# Patient Record
Sex: Female | Born: 1940 | Race: White | Hispanic: No | Smoking: Never smoker
Health system: Southern US, Community
[De-identification: ages and names within clinical notes are randomized; demographics above are authoritative.]

## PROBLEM LIST (undated history)

## (undated) DIAGNOSIS — K5792 Diverticulitis of intestine, part unspecified, without perforation or abscess without bleeding: Secondary | ICD-10-CM

## (undated) DIAGNOSIS — J383 Other diseases of vocal cords: Secondary | ICD-10-CM

## (undated) DIAGNOSIS — I5189 Other ill-defined heart diseases: Secondary | ICD-10-CM

## (undated) DIAGNOSIS — R0600 Dyspnea, unspecified: Secondary | ICD-10-CM

## (undated) DIAGNOSIS — I00 Rheumatic fever without heart involvement: Secondary | ICD-10-CM

## (undated) DIAGNOSIS — I10 Essential (primary) hypertension: Secondary | ICD-10-CM

## (undated) DIAGNOSIS — K449 Diaphragmatic hernia without obstruction or gangrene: Secondary | ICD-10-CM

## (undated) DIAGNOSIS — E7800 Pure hypercholesterolemia, unspecified: Secondary | ICD-10-CM

## (undated) DIAGNOSIS — I447 Left bundle-branch block, unspecified: Secondary | ICD-10-CM

## (undated) DIAGNOSIS — E119 Type 2 diabetes mellitus without complications: Secondary | ICD-10-CM

## (undated) DIAGNOSIS — I509 Heart failure, unspecified: Secondary | ICD-10-CM

## (undated) DIAGNOSIS — K922 Gastrointestinal hemorrhage, unspecified: Secondary | ICD-10-CM

## (undated) DIAGNOSIS — Z86010 Personal history of colonic polyps: Secondary | ICD-10-CM

## (undated) DIAGNOSIS — Z8489 Family history of other specified conditions: Secondary | ICD-10-CM

## (undated) DIAGNOSIS — G4730 Sleep apnea, unspecified: Secondary | ICD-10-CM

## (undated) DIAGNOSIS — N809 Endometriosis, unspecified: Secondary | ICD-10-CM

## (undated) DIAGNOSIS — N183 Chronic kidney disease, stage 3 unspecified: Secondary | ICD-10-CM

## (undated) DIAGNOSIS — I480 Paroxysmal atrial fibrillation: Secondary | ICD-10-CM

## (undated) DIAGNOSIS — D649 Anemia, unspecified: Secondary | ICD-10-CM

## (undated) DIAGNOSIS — R32 Unspecified urinary incontinence: Secondary | ICD-10-CM

## (undated) DIAGNOSIS — M810 Age-related osteoporosis without current pathological fracture: Secondary | ICD-10-CM

## (undated) DIAGNOSIS — Z9861 Coronary angioplasty status: Secondary | ICD-10-CM

## (undated) DIAGNOSIS — G43909 Migraine, unspecified, not intractable, without status migrainosus: Secondary | ICD-10-CM

## (undated) DIAGNOSIS — K219 Gastro-esophageal reflux disease without esophagitis: Secondary | ICD-10-CM

## (undated) DIAGNOSIS — I2510 Atherosclerotic heart disease of native coronary artery without angina pectoris: Secondary | ICD-10-CM

## (undated) DIAGNOSIS — I2119 ST elevation (STEMI) myocardial infarction involving other coronary artery of inferior wall: Secondary | ICD-10-CM

## (undated) HISTORY — PX: BREAST EXCISIONAL BIOPSY: SUR124

## (undated) HISTORY — DX: Paroxysmal atrial fibrillation: I48.0

## (undated) HISTORY — DX: Chronic kidney disease, stage 3 unspecified: N18.30

## (undated) HISTORY — DX: Gastrointestinal hemorrhage, unspecified: K92.2

## (undated) HISTORY — DX: Rheumatic fever without heart involvement: I00

## (undated) HISTORY — DX: Migraine, unspecified, not intractable, without status migrainosus: G43.909

## (undated) HISTORY — DX: Personal history of colonic polyps: Z86.010

## (undated) HISTORY — DX: Sleep apnea, unspecified: G47.30

## (undated) HISTORY — DX: Endometriosis, unspecified: N80.9

## (undated) HISTORY — PX: TONSILLECTOMY: SUR1361

## (undated) HISTORY — PX: APPENDECTOMY: SHX54

## (undated) HISTORY — DX: Chronic kidney disease, stage 3 (moderate): N18.3

## (undated) HISTORY — DX: Unspecified urinary incontinence: R32

## (undated) HISTORY — PX: OTHER SURGICAL HISTORY: SHX169

## (undated) HISTORY — DX: Age-related osteoporosis without current pathological fracture: M81.0

## (undated) HISTORY — PX: BREAST BIOPSY: SHX20

## (undated) HISTORY — DX: Heart failure, unspecified: I50.9

## (undated) HISTORY — PX: BREAST SURGERY: SHX581

## (undated) HISTORY — DX: Anemia, unspecified: D64.9

## (undated) HISTORY — DX: Pure hypercholesterolemia, unspecified: E78.00

## (undated) HISTORY — DX: Essential (primary) hypertension: I10

## (undated) HISTORY — DX: Left bundle-branch block, unspecified: I44.7

## (undated) HISTORY — PX: ABDOMINAL HYSTERECTOMY: SHX81

## (undated) HISTORY — DX: Diaphragmatic hernia without obstruction or gangrene: K44.9

## (undated) HISTORY — DX: Other ill-defined heart diseases: I51.89

## (undated) HISTORY — DX: Diverticulitis of intestine, part unspecified, without perforation or abscess without bleeding: K57.92

## (undated) HISTORY — DX: Type 2 diabetes mellitus without complications: E11.9

---

## 1994-08-30 DIAGNOSIS — Z860100 Personal history of colon polyps, unspecified: Secondary | ICD-10-CM

## 1994-08-30 HISTORY — DX: Personal history of colon polyps, unspecified: Z86.0100

## 1994-08-30 HISTORY — DX: Personal history of colonic polyps: Z86.010

## 2004-03-10 ENCOUNTER — Ambulatory Visit: Payer: Self-pay | Admitting: General Surgery

## 2005-03-16 ENCOUNTER — Ambulatory Visit: Payer: Self-pay | Admitting: General Surgery

## 2005-06-15 ENCOUNTER — Ambulatory Visit: Payer: Self-pay | Admitting: Internal Medicine

## 2006-02-01 ENCOUNTER — Ambulatory Visit: Payer: Self-pay | Admitting: Internal Medicine

## 2006-05-03 ENCOUNTER — Ambulatory Visit: Payer: Self-pay | Admitting: General Surgery

## 2006-05-24 ENCOUNTER — Ambulatory Visit: Payer: Self-pay | Admitting: General Surgery

## 2006-06-08 ENCOUNTER — Ambulatory Visit: Payer: Self-pay | Admitting: General Surgery

## 2006-06-28 ENCOUNTER — Ambulatory Visit: Payer: Self-pay | Admitting: Internal Medicine

## 2006-08-25 ENCOUNTER — Ambulatory Visit: Payer: Self-pay | Admitting: Specialist

## 2006-12-10 ENCOUNTER — Ambulatory Visit: Payer: Self-pay | Admitting: Pain Medicine

## 2006-12-25 ENCOUNTER — Ambulatory Visit: Payer: Self-pay | Admitting: Pain Medicine

## 2007-01-10 ENCOUNTER — Ambulatory Visit: Payer: Self-pay | Admitting: Physician Assistant

## 2007-05-14 ENCOUNTER — Ambulatory Visit: Payer: Self-pay | Admitting: General Surgery

## 2008-02-12 ENCOUNTER — Ambulatory Visit: Payer: Self-pay | Admitting: Internal Medicine

## 2008-04-15 ENCOUNTER — Ambulatory Visit: Payer: Self-pay | Admitting: Unknown Physician Specialty

## 2008-06-02 ENCOUNTER — Ambulatory Visit: Payer: Self-pay | Admitting: General Surgery

## 2009-05-01 HISTORY — PX: CARDIAC CATHETERIZATION: SHX172

## 2009-06-04 ENCOUNTER — Ambulatory Visit: Payer: Self-pay | Admitting: General Surgery

## 2009-06-11 ENCOUNTER — Ambulatory Visit: Payer: Self-pay | Admitting: General Surgery

## 2009-06-14 ENCOUNTER — Ambulatory Visit: Payer: Self-pay | Admitting: Specialist

## 2009-12-09 ENCOUNTER — Ambulatory Visit: Payer: Self-pay | Admitting: General Surgery

## 2010-01-13 ENCOUNTER — Ambulatory Visit: Payer: Self-pay | Admitting: Internal Medicine

## 2010-01-29 HISTORY — PX: CARDIAC CATHETERIZATION: SHX172

## 2010-02-09 ENCOUNTER — Ambulatory Visit: Payer: Self-pay | Admitting: Cardiology

## 2010-06-06 ENCOUNTER — Ambulatory Visit: Payer: Medicare Other | Admitting: General Surgery

## 2010-06-08 ENCOUNTER — Ambulatory Visit: Payer: Medicare Other | Admitting: General Surgery

## 2010-12-15 ENCOUNTER — Ambulatory Visit: Payer: Medicare Other | Admitting: General Surgery

## 2011-05-02 HISTORY — PX: COLONOSCOPY: SHX174

## 2011-06-08 ENCOUNTER — Ambulatory Visit: Payer: Self-pay | Admitting: General Surgery

## 2011-06-22 ENCOUNTER — Ambulatory Visit: Payer: Self-pay | Admitting: General Surgery

## 2011-06-23 ENCOUNTER — Ambulatory Visit: Payer: Self-pay | Admitting: General Surgery

## 2012-02-08 LAB — HEPATIC FUNCTION PANEL
ALT: 16 U/L (ref 7–35)
AST: 24 U/L (ref 13–35)
Alkaline Phosphatase: 58 U/L (ref 25–125)
Bilirubin, Total: 0.7 mg/dL

## 2012-02-08 LAB — LIPID PANEL
Cholesterol: 178 mg/dL (ref 0–200)
HDL: 45 mg/dL (ref 35–70)

## 2012-02-08 LAB — BASIC METABOLIC PANEL
Creatinine: 0.8 mg/dL (ref <=1.1)
Glucose: 96 mg/dL
Potassium: 4.5 mmol/L (ref 3.4–5.3)

## 2012-06-10 ENCOUNTER — Ambulatory Visit: Payer: Self-pay | Admitting: General Surgery

## 2012-06-12 LAB — HM MAMMOGRAPHY

## 2012-07-08 ENCOUNTER — Encounter: Payer: Self-pay | Admitting: *Deleted

## 2012-07-08 ENCOUNTER — Other Ambulatory Visit: Payer: Self-pay | Admitting: *Deleted

## 2012-07-09 ENCOUNTER — Ambulatory Visit (INDEPENDENT_AMBULATORY_CARE_PROVIDER_SITE_OTHER): Payer: BC Managed Care – PPO | Admitting: Internal Medicine

## 2012-07-09 ENCOUNTER — Encounter: Payer: Self-pay | Admitting: Internal Medicine

## 2012-07-09 VITALS — BP 120/60 | HR 73 | Temp 98.3°F | Ht 59.15 in | Wt 173.0 lb

## 2012-07-09 DIAGNOSIS — Z860100 Personal history of colon polyps, unspecified: Secondary | ICD-10-CM

## 2012-07-09 DIAGNOSIS — I10 Essential (primary) hypertension: Secondary | ICD-10-CM

## 2012-07-09 DIAGNOSIS — M810 Age-related osteoporosis without current pathological fracture: Secondary | ICD-10-CM

## 2012-07-09 DIAGNOSIS — E7800 Pure hypercholesterolemia, unspecified: Secondary | ICD-10-CM

## 2012-07-09 DIAGNOSIS — G4733 Obstructive sleep apnea (adult) (pediatric): Secondary | ICD-10-CM

## 2012-08-18 ENCOUNTER — Encounter: Payer: Self-pay | Admitting: Internal Medicine

## 2012-08-18 DIAGNOSIS — Z86010 Personal history of colonic polyps: Secondary | ICD-10-CM | POA: Insufficient documentation

## 2012-08-18 DIAGNOSIS — E785 Hyperlipidemia, unspecified: Secondary | ICD-10-CM | POA: Insufficient documentation

## 2012-08-18 DIAGNOSIS — E7800 Pure hypercholesterolemia, unspecified: Secondary | ICD-10-CM | POA: Insufficient documentation

## 2012-08-18 DIAGNOSIS — M810 Age-related osteoporosis without current pathological fracture: Secondary | ICD-10-CM | POA: Insufficient documentation

## 2012-08-18 DIAGNOSIS — G4733 Obstructive sleep apnea (adult) (pediatric): Secondary | ICD-10-CM | POA: Insufficient documentation

## 2012-08-18 DIAGNOSIS — I10 Essential (primary) hypertension: Secondary | ICD-10-CM | POA: Insufficient documentation

## 2012-08-18 NOTE — Assessment & Plan Note (Signed)
Continue with CPAP

## 2012-08-18 NOTE — Assessment & Plan Note (Signed)
On pravastatin.  Low cholesterol diet and exercise.  Check lipid panel and liver function.

## 2012-08-18 NOTE — Assessment & Plan Note (Signed)
Obtain records of last bone density.

## 2012-08-18 NOTE — Assessment & Plan Note (Signed)
Blood pressure as outlined.  Continue same medication regimen.  Check metabolic panel.

## 2012-08-18 NOTE — Assessment & Plan Note (Signed)
2008 colonoscopy as outlined.  Obtain 2013 results.

## 2012-08-18 NOTE — Progress Notes (Signed)
  Subjective:    Patient ID: April [name], female    DOB: Nov 19, 1940, 72 y.o.   MRN: [id number]  HPI 72 year old female with past history of hypertension, hypercholesterolemia, sleep apnea and osteoporosis who comes in today to follow up on these issues as well as to establish care.  She states she is doing relatively well.  Previously was seeing Dr Ouida Sills.  She sees Dr Jamal Collin for her breast.  Had follow up with him 2/14.  Sees him once a year.  Had a colonoscopy 06/23/11.  Diverticulosis.  Had a heart cath in 2013 - Dr Saralyn Pilar.  Need results.  This was done secondary to problems she was having with sweating - head to toe.  Would feel light headed.  Had a questionable stress test.  States cath ok.  Breathing is stable.  Gets sob with exertion, but reports this is no change.  Has had issues with bronchitis.  Saw Dr Pryor Ochoa.  Has never smoked.  Was exposed to second hand smoke.  Has a history of an irregular heart beat.  May notice an occasional fluttering.  No acid reflux reported.  Eating and drinking well.  Bowels stable.    Past Medical History  Diagnosis Date  . Hypertension   . Hypercholesterolemia   . History of colon polyps 08/1994  . Osteoporosis   . Sleep apnea     CPAP  . Arrhythmia     history of an irregular heart beat  . Diverticulitis   . Urine incontinence   . Rheumatic fever   . Migraines     history of migraines  . Endometriosis     Review of Systems Patient denies any headache, lightheadedness or dizziness.  No sinus or allergy symptoms currently. No chest pain, tightness or significant palpitations.  May notice an occasional fluttering.  No increased shortness of breath, cough or congestion.  No acid reflux reported.  No nausea or vomiting.  No abdominal pain or cramping.  No bowel change, such as diarrhea, constipation, BRBPR or melana.  No urine change.        Objective:   Physical Exam Filed Vitals:   07/09/12 1137  BP: 120/60  Pulse: 73  Temp: 98.3 F (56.60  C)   72 year old female in no acute distress.   HEENT:  Nares- clear.  Oropharynx - without lesions. NECK:  Supple.  Nontender.  No audible bruit.  HEART:  Appears to be regular. LUNGS:  No crackles or wheezing audible.  Respirations even and unlabored.  RADIAL PULSE:  Equal bilaterally.    ABDOMEN:  Soft, nontender.  Bowel sounds present and normal.  No audible abdominal bruit.   EXTREMITIES:  No increased edema present.  DP pulses palpable and equal bilaterally.           Assessment & Plan:  HEALTH MAINTENANCE.  Schedule her for a physical next visit.  Obtain records from Dr Jamal Collin - mammograms and breast exams.  States she is up to date.  Colonoscopy 2/08 normal.  Was due 06/2011.  States had 06/23/11.  Obtain records if had 2013 colon.  Pneumovax 06/12/05, Tdap 3/12 and Zostavax 2008.

## 2012-08-29 ENCOUNTER — Telehealth: Payer: Self-pay

## 2012-08-29 NOTE — Telephone Encounter (Signed)
Patient left message on voicemail about some prescriptions for supplies for her CPAP. Left message for patient to return call to office to clarify exactly what she needs.

## 2012-08-29 NOTE — Telephone Encounter (Signed)
Just let me know what I need to do.

## 2012-08-30 NOTE — Telephone Encounter (Signed)
Left message for patient to return call office about CPAP supplies.

## 2012-09-04 ENCOUNTER — Telehealth: Payer: Self-pay | Admitting: Internal Medicine

## 2012-09-04 NOTE — Telephone Encounter (Signed)
montrice had a message on this patient regarding her CPAP supplies.  Pt's mask is leaking and she needs [facility] to evaluate for a new mask and tubing.  Can use more filters also.  Pt spoke to [place] informed her needed a rx from [place].  I just wrote a generic rx for evaluation.  Let me know if I need to do something more.  Thanks.

## 2012-10-11 ENCOUNTER — Ambulatory Visit (INDEPENDENT_AMBULATORY_CARE_PROVIDER_SITE_OTHER): Payer: BC Managed Care – PPO | Admitting: Internal Medicine

## 2012-10-11 ENCOUNTER — Other Ambulatory Visit (HOSPITAL_COMMUNITY)
Admission: RE | Admit: 2012-10-11 | Discharge: 2012-10-11 | Disposition: A | Discharge status: Home / Self Care | Payer: BC Managed Care – PPO | Source: Ambulatory Visit | Attending: Internal Medicine | Admitting: Internal Medicine

## 2012-10-11 ENCOUNTER — Encounter: Payer: Self-pay | Admitting: Internal Medicine

## 2012-10-11 ENCOUNTER — Ambulatory Visit (INDEPENDENT_AMBULATORY_CARE_PROVIDER_SITE_OTHER)
Admission: RE | Admit: 2012-10-11 | Discharge: 2012-10-11 | Disposition: A | Discharge status: Home / Self Care | Payer: BC Managed Care – PPO | Source: Ambulatory Visit | Attending: Internal Medicine | Admitting: Internal Medicine

## 2012-10-11 VITALS — BP 138/70 | HR 66 | Temp 98.4°F | Ht 60.0 in | Wt 174.5 lb

## 2012-10-11 DIAGNOSIS — K219 Gastro-esophageal reflux disease without esophagitis: Secondary | ICD-10-CM

## 2012-10-11 DIAGNOSIS — Z124 Encounter for screening for malignant neoplasm of cervix: Secondary | ICD-10-CM

## 2012-10-11 DIAGNOSIS — R0602 Shortness of breath: Secondary | ICD-10-CM

## 2012-10-11 DIAGNOSIS — Z86010 Personal history of colonic polyps: Secondary | ICD-10-CM

## 2012-10-11 DIAGNOSIS — Z01419 Encounter for gynecological examination (general) (routine) without abnormal findings: Secondary | ICD-10-CM | POA: Insufficient documentation

## 2012-10-11 DIAGNOSIS — E7800 Pure hypercholesterolemia, unspecified: Secondary | ICD-10-CM

## 2012-10-11 DIAGNOSIS — I10 Essential (primary) hypertension: Secondary | ICD-10-CM

## 2012-10-11 DIAGNOSIS — G4733 Obstructive sleep apnea (adult) (pediatric): Secondary | ICD-10-CM

## 2012-10-11 DIAGNOSIS — Z1151 Encounter for screening for human papillomavirus (HPV): Secondary | ICD-10-CM | POA: Insufficient documentation

## 2012-10-11 DIAGNOSIS — M810 Age-related osteoporosis without current pathological fracture: Secondary | ICD-10-CM

## 2012-10-13 ENCOUNTER — Encounter: Payer: Self-pay | Admitting: Internal Medicine

## 2012-10-13 DIAGNOSIS — K219 Gastro-esophageal reflux disease without esophagitis: Secondary | ICD-10-CM | POA: Insufficient documentation

## 2012-10-13 DIAGNOSIS — R0602 Shortness of breath: Secondary | ICD-10-CM | POA: Insufficient documentation

## 2012-10-13 DIAGNOSIS — R0609 Other forms of dyspnea: Secondary | ICD-10-CM | POA: Insufficient documentation

## 2012-10-13 NOTE — Progress Notes (Signed)
Subjective:    Patient ID: April [name], female    DOB: 1941-01-03, 72 y.o.   MRN: [id number]  HPI 72 year old female with past history of hypertension, hypercholesterolemia, sleep apnea and osteoporosis who comes in today to follow up on these issues as well as for a complete physical exam.  She states she is doing relatively well.  She sees Dr Jamal Collin for her breast.  Had follow up with him 2/14.  Sees him once a year.  Had a colonoscopy 06/23/11.  Diverticulosis.  Had a heart cath in 2013 - Dr Saralyn Pilar.  This was done secondary to problems she was having with sweating - head to toe.  Would feel light headed.  Had a questionable stress test.  States cath ok.  She does report getting sob with exertion.  No significant difference as it was when she had her cath.  Is persistent.  Has had issues with bronchitis.  Saw Dr Pryor Ochoa.  Has never smoked.  Was exposed to second hand smoke.  Has a history of an irregular heart beat.  Per pt, her heart appears to be stable.  Eating and drinking well.  Bowels stable.  Does report some acid reflux.  Takes TUMS prn.  Has previously been on prilosec and prevacid.  Seeing Dr Tobe Sos.  Treating a current eye infection.     Past Medical History  Diagnosis Date  . Hypertension   . Hypercholesterolemia   . History of colon polyps 08/1994  . Osteoporosis   . Sleep apnea     CPAP  . Arrhythmia     history of an irregular heart beat  . Diverticulitis   . Urine incontinence   . Rheumatic fever   . Migraines     history of migraines  . Endometriosis     Current Outpatient Prescriptions on File Prior to Visit  Medication Sig Dispense Refill  . aspirin 81 MG tablet Take 81 mg by mouth daily.      . carvedilol (COREG) 25 MG tablet Take 25 mg by mouth 2 (two) times daily with a meal.      . fish oil-omega-3 fatty acids 1000 MG capsule Take 1 g by mouth daily.      Marland Kitchen losartan-hydrochlorothiazide (HYZAAR) 100-25 MG per tablet Take 1 tablet by mouth daily.      . Misc  Natural Products (OSTEO BI-FLEX JOINT SHIELD PO) Take by mouth 2 (two) times daily.      . pravastatin (PRAVACHOL) 20 MG tablet Take 20 mg by mouth daily.      . traMADol (ULTRAM) 50 MG tablet Take 50 mg by mouth 2 (two) times daily as needed for pain.      . calcium carbonate (TUMS - DOSED IN MG ELEMENTAL CALCIUM) 500 MG chewable tablet Chew 1 tablet by mouth daily.      Marland Kitchen triamcinolone (NASACORT) 55 MCG/ACT nasal inhaler Place 2 sprays into the nose daily.       No current facility-administered medications on file prior to visit.    Review of Systems Patient denies any headache, lightheadedness or dizziness.  No sinus or allergy symptoms currently. No chest pain, tightness or significant palpitations.  No increased cough or congestion.  Does report some sob with exertion.  Fatigue with exertion.  Acid reflux as outlined.  No nausea or vomiting.  No abdominal pain or cramping.  No bowel change, such as diarrhea, constipation, BRBPR or melana.  No urine change.  Objective:   Physical Exam  Filed Vitals:   10/11/12 1033  BP: 138/70  Pulse: 66  Temp: 98.4 F (28.93 C)   72 year old female in no acute distress.   HEENT:  Nares- clear.  Oropharynx - without lesions. NECK:  Supple.  Nontender.  No audible bruit.  HEART:  Appears to be regular. LUNGS:  No crackles or wheezing audible.  Respirations even and unlabored.  RADIAL PULSE:  Equal bilaterally.    BREASTS:  No nipple discharge or nipple retraction present.  Could not appreciate any distinct nodules or axillary adenopathy.  ABDOMEN:  Soft, nontender.  Bowel sounds present and normal.  No audible abdominal bruit.  GU:  Normal external genitalia.  Vaginal vault without lesions.  S/p hysterectomy.   Pap performed. Could not appreciate any adnexal masses or tenderness.   RECTAL:  Heme negative.   EXTREMITIES:  No increased edema present.  DP pulses palpable and equal bilaterally.          Assessment & Plan:  HEALTH MAINTENANCE.   Physical today.  Colonoscopy 06/23/11.  Pneumovax 06/12/05, Tdap 3/12 and Zostavax 2008.  Mammogram 06/10/12.  Followed by Dr Jamal Collin.

## 2012-10-13 NOTE — Assessment & Plan Note (Signed)
On pravastatin.  Low cholesterol diet and exercise.  Check lipid panel and liver function.

## 2012-10-13 NOTE — Assessment & Plan Note (Signed)
Blood pressure as outlined.  Continue same medication regimen.  Check metabolic panel.

## 2012-10-13 NOTE — Assessment & Plan Note (Signed)
Continue with CPAP

## 2012-10-13 NOTE — Assessment & Plan Note (Signed)
Avoid eating late and avoid foods that aggravate.  May need to consider protonix 40mg  q day.  Follow.

## 2012-10-13 NOTE — Assessment & Plan Note (Signed)
Last colonoscopy 06/23/11 revealed diverticulosis otherwise normal.  Recommended f/u colonoscopy in five years.

## 2012-10-13 NOTE — Assessment & Plan Note (Addendum)
Continue calcium and vitamin D 

## 2012-10-13 NOTE — Assessment & Plan Note (Signed)
Has had extensive cardiac w/up as outlined.  Sob with exertion.  Will hold on repeat cardiac w/up.  She feels from a heart standpoint - things are stable.  Check cxr.  Further w/up pending.

## 2012-10-14 ENCOUNTER — Telehealth: Payer: Self-pay | Admitting: Internal Medicine

## 2012-10-14 ENCOUNTER — Other Ambulatory Visit: Payer: Self-pay | Admitting: Internal Medicine

## 2012-10-14 DIAGNOSIS — R9389 Abnormal findings on diagnostic imaging of other specified body structures: Secondary | ICD-10-CM

## 2012-10-14 DIAGNOSIS — R0602 Shortness of breath: Secondary | ICD-10-CM

## 2012-10-14 DIAGNOSIS — I10 Essential (primary) hypertension: Secondary | ICD-10-CM

## 2012-10-14 NOTE — Telephone Encounter (Signed)
Pt notified of cxr results.  Agreeable to CT chest.  Order placed.  She is getting her labs Thursday and I want her CT to be done after we have her lab results.  Ok to schedule for next week.  Thanks.

## 2012-10-14 NOTE — Progress Notes (Signed)
Order placed for urinalysis 

## 2012-10-16 ENCOUNTER — Encounter: Payer: Self-pay | Admitting: *Deleted

## 2012-10-17 ENCOUNTER — Other Ambulatory Visit (INDEPENDENT_AMBULATORY_CARE_PROVIDER_SITE_OTHER): Payer: BC Managed Care – PPO

## 2012-10-17 DIAGNOSIS — G4733 Obstructive sleep apnea (adult) (pediatric): Secondary | ICD-10-CM

## 2012-10-17 DIAGNOSIS — M810 Age-related osteoporosis without current pathological fracture: Secondary | ICD-10-CM

## 2012-10-17 DIAGNOSIS — I10 Essential (primary) hypertension: Secondary | ICD-10-CM

## 2012-10-17 DIAGNOSIS — E7800 Pure hypercholesterolemia, unspecified: Secondary | ICD-10-CM

## 2012-10-17 LAB — HEPATIC FUNCTION PANEL
AST: 33 U/L (ref 0–37)
Albumin: 3.7 g/dL (ref 3.5–5.2)
Alkaline Phosphatase: 60 U/L (ref 39–117)
Total Protein: 6.8 g/dL (ref 6.0–8.3)

## 2012-10-17 LAB — LIPID PANEL
Cholesterol: 187 mg/dL (ref 0–200)
LDL Cholesterol: 116 mg/dL — ABNORMAL HIGH (ref 0–99)
Triglycerides: 112 mg/dL (ref 0.0–149.0)

## 2012-10-17 LAB — BASIC METABOLIC PANEL
Chloride: 105 mEq/L (ref 96–112)
Creatinine, Ser: 0.7 mg/dL (ref 0.4–1.2)

## 2012-10-17 LAB — URINALYSIS, ROUTINE W REFLEX MICROSCOPIC
Bilirubin Urine: NEGATIVE
Hgb urine dipstick: NEGATIVE
Ketones, ur: NEGATIVE
Total Protein, Urine: NEGATIVE
Urine Glucose: NEGATIVE

## 2012-10-18 ENCOUNTER — Telehealth: Payer: Self-pay

## 2012-10-18 LAB — CBC WITH DIFFERENTIAL/PLATELET
Basophils Absolute: 0 10*3/uL (ref 0.0–0.1)
Eosinophils Absolute: 0.2 10*3/uL (ref 0.0–0.7)
Lymphocytes Relative: 31.8 % (ref 12.0–46.0)
MCHC: 32.7 g/dL (ref 30.0–36.0)
Neutro Abs: 3.7 10*3/uL (ref 1.4–7.7)
Neutrophils Relative %: 56.7 % (ref 43.0–77.0)
RDW: 13.3 % (ref 11.5–14.6)

## 2012-10-18 NOTE — Telephone Encounter (Signed)
Fax patient recent labs to the [facility] for patient please to this number. [phone] Attn: Colletta [place]

## 2012-10-21 ENCOUNTER — Ambulatory Visit: Payer: Self-pay | Admitting: Internal Medicine

## 2012-10-21 NOTE — Telephone Encounter (Signed)
Labs faxed

## 2012-10-22 ENCOUNTER — Telehealth: Payer: Self-pay | Admitting: Internal Medicine

## 2012-10-22 NOTE — Telephone Encounter (Signed)
This is the patient you talked to [place] about and she had stated she may need a new sleep study.  I explained this to the pt and the pt informed me that April [name] told her she just needed a copy of her sleep study.  She was going to call April [name] and find out what was needed.  I don't think from your previous conversation, that I can just send a rx over.  (maybe I misunderstood).  Please let pt know what she needs to do.  Thanks.

## 2012-10-22 NOTE — Telephone Encounter (Signed)
rx written and in your box.  Thanks.

## 2012-10-22 NOTE — Telephone Encounter (Signed)
Pt stated that she has an appt with [name] at [facility] tomorrow. [name] told her that all she needs is a Careers information officer for a new Mask & hose/tubing.

## 2012-10-22 NOTE — Telephone Encounter (Signed)
Patient left me a voicemail stating we were supposed to fax an order to [facility] for the patient to get supplies. Please advise

## 2012-10-24 NOTE — Telephone Encounter (Signed)
Spoke with [name] at [facility]- she stated that the April [name] was going to bring in a copy of her sleep study today at her appt. She told me to hold the RX until she send me an order. However, if her sleep study is out dated, she will need a new study. Pt also informed

## 2012-10-28 ENCOUNTER — Telehealth: Payer: Self-pay | Admitting: *Deleted

## 2012-10-28 DIAGNOSIS — G4733 Obstructive sleep apnea (adult) (pediatric): Secondary | ICD-10-CM

## 2012-10-28 NOTE — Telephone Encounter (Signed)
I spoke with April [name] this morning & notified her that we are unable to determine the CPAP settings that she should be on based on her 2002 sleep study. Pt informed that a new sleep study should be ordered. Pt is agreeable.

## 2012-10-28 NOTE — Telephone Encounter (Signed)
Order placed for f/u sleep study.

## 2012-10-30 ENCOUNTER — Ambulatory Visit: Payer: Self-pay | Admitting: Ophthalmology

## 2012-10-30 DIAGNOSIS — H04209 Unspecified epiphora, unspecified lacrimal gland: Secondary | ICD-10-CM | POA: Diagnosis not present

## 2012-11-08 ENCOUNTER — Encounter: Payer: Self-pay | Admitting: *Deleted

## 2012-11-15 ENCOUNTER — Encounter: Payer: Self-pay | Admitting: Internal Medicine

## 2013-02-11 ENCOUNTER — Ambulatory Visit (INDEPENDENT_AMBULATORY_CARE_PROVIDER_SITE_OTHER): Payer: No Typology Code available for payment source | Admitting: Internal Medicine

## 2013-02-11 ENCOUNTER — Encounter: Payer: Self-pay | Admitting: Internal Medicine

## 2013-02-11 VITALS — BP 118/80 | HR 69 | Temp 98.4°F | Ht 60.0 in | Wt 167.5 lb

## 2013-02-11 DIAGNOSIS — E7800 Pure hypercholesterolemia, unspecified: Secondary | ICD-10-CM

## 2013-02-11 DIAGNOSIS — R0602 Shortness of breath: Secondary | ICD-10-CM

## 2013-02-11 DIAGNOSIS — F439 Reaction to severe stress, unspecified: Secondary | ICD-10-CM

## 2013-02-11 DIAGNOSIS — R079 Chest pain, unspecified: Secondary | ICD-10-CM

## 2013-02-11 DIAGNOSIS — G4733 Obstructive sleep apnea (adult) (pediatric): Secondary | ICD-10-CM

## 2013-02-11 DIAGNOSIS — Z733 Stress, not elsewhere classified: Secondary | ICD-10-CM

## 2013-02-11 DIAGNOSIS — I10 Essential (primary) hypertension: Secondary | ICD-10-CM

## 2013-02-11 DIAGNOSIS — R0781 Pleurodynia: Secondary | ICD-10-CM

## 2013-02-11 DIAGNOSIS — K219 Gastro-esophageal reflux disease without esophagitis: Secondary | ICD-10-CM

## 2013-02-11 DIAGNOSIS — Z86010 Personal history of colonic polyps: Secondary | ICD-10-CM

## 2013-02-11 DIAGNOSIS — M810 Age-related osteoporosis without current pathological fracture: Secondary | ICD-10-CM

## 2013-02-16 ENCOUNTER — Encounter: Payer: Self-pay | Admitting: Internal Medicine

## 2013-02-16 DIAGNOSIS — R0781 Pleurodynia: Secondary | ICD-10-CM | POA: Insufficient documentation

## 2013-02-16 DIAGNOSIS — F439 Reaction to severe stress, unspecified: Secondary | ICD-10-CM | POA: Insufficient documentation

## 2013-02-16 NOTE — Assessment & Plan Note (Signed)
Avoid eating late and avoid foods that aggravate.  On nexium now.  Doing better.  Follow.

## 2013-02-16 NOTE — Assessment & Plan Note (Signed)
Better.  Xray negative.  Follow.  Good breath sounds.

## 2013-02-16 NOTE — Assessment & Plan Note (Signed)
Continue with CPAP

## 2013-02-16 NOTE — Assessment & Plan Note (Signed)
Feels she is handling things relatively well.  Follow.  Desires no further intervention.

## 2013-02-16 NOTE — Progress Notes (Signed)
Subjective:    Patient ID: April [name], female    DOB: 18-Dec-1940, 72 y.o.   MRN: [id number]  HPI 72 year old female with past history of hypertension, hypercholesterolemia, sleep apnea and osteoporosis who comes in today for a scheduled follow up.  She states she is doing relatively well.  Increased stress dealing with her husbands and her daughter's medical issues.  Does not feel she needs any further intervention at this time.  She sees Dr Jamal Collin for her breast.  Had follow up with him 2/14.  Sees him once a year.  Had a colonoscopy 06/23/11.  Diverticulosis.  Had a heart cath in 2013 - Dr Saralyn Pilar.  This was done secondary to problems she was having with sweating - head to toe.  Would feel light headed.  Had a questionable stress test.  States cath ok.  She does report getting sob with exertion.  No significant difference as it was when she had her cath.  Is persistent.  Has had issues with bronchitis.  Saw Dr Pryor Ochoa.  Has never smoked.  Was exposed to second hand smoke.  Has a history of an irregular heart beat.  Per pt, her heart appears to be stable.  Eating and drinking well.  Bowels stable.  On nexium now and feels this is helping.  She had an abnormal cxr and subsequently had a CT chest.  Has a large hiatal hernia.  Feels the acid reflux is stable/controlled.  Swallowing ok.  She is having some right rib pain.  [facility].  Xray negative.  Better.  No significant pain with deep breathing.       Past Medical History  Diagnosis Date  . Hypertension   . Hypercholesterolemia   . History of colon polyps 08/1994  . Osteoporosis   . Sleep apnea     CPAP  . Arrhythmia     history of an irregular heart beat  . Diverticulitis   . Urine incontinence   . Rheumatic fever   . Migraines     history of migraines  . Endometriosis     Current Outpatient Prescriptions on File Prior to Visit  Medication Sig Dispense Refill  . aspirin 81 MG tablet Take 81 mg by mouth daily.       . calcium carbonate (TUMS - DOSED IN MG ELEMENTAL CALCIUM) 500 MG chewable tablet Chew 1 tablet by mouth daily.      . carvedilol (COREG) 25 MG tablet Take 25 mg by mouth 2 (two) times daily with a meal.      . fish oil-omega-3 fatty acids 1000 MG capsule Take 1 g by mouth daily.      Marland Kitchen losartan-hydrochlorothiazide (HYZAAR) 100-25 MG per tablet Take 1 tablet by mouth daily.      . Misc Natural Products (OSTEO BI-FLEX JOINT SHIELD PO) Take by mouth 2 (two) times daily.      . pravastatin (PRAVACHOL) 20 MG tablet Take 20 mg by mouth daily.      . traMADol (ULTRAM) 50 MG tablet Take 50 mg by mouth 2 (two) times daily as needed for pain.      Marland Kitchen triamcinolone (NASACORT) 55 MCG/ACT nasal inhaler Place 2 sprays into the nose daily as needed.        No current facility-administered medications on file prior to visit.    Review of Systems Patient denies any headache, lightheadedness or dizziness.  No sinus or allergy symptoms currently. No chest pain, tightness or  significant palpitations.  No increased cough or congestion.  Does report some sob with exertion.  Stable.  W/up as outlined.  Rib pain as outlined.  Improved.   Acid reflux as outlined.  Better.  No nausea or vomiting.  No abdominal pain or cramping.  No bowel change, such as diarrhea, constipation, BRBPR or melana.  No urine change.        Objective:   Physical Exam  Filed Vitals:   02/11/13 1104  BP: 118/80  Pulse: 69  Temp: 98.4 F (36.9 C)   Blood pressure recheck:  128/70, pulse 87  72 year old female in no acute distress.   HEENT:  Nares- clear.  Oropharynx - without lesions. NECK:  Supple.  Nontender.  No audible bruit.  HEART:  Appears to be regular. LUNGS:  No crackles or wheezing audible.  Respirations even and unlabored.  RADIAL PULSE:  Equal bilaterally.    RIBS:  Minimal pain right lateral rib.  Good breath sounds bilaterally.   ABDOMEN:  Soft, nontender.  Bowel sounds present and normal.  No audible abdominal  bruit.    EXTREMITIES:  No increased edema present.  DP pulses palpable and equal bilaterally.          Assessment & Plan:  HEALTH MAINTENANCE.  Physical 10/11/12.   Colonoscopy 06/23/11.  Pneumovax 06/12/05, Tdap 3/12 and Zostavax 2008.  Mammogram 06/10/12.  Followed by Dr Jamal Collin.

## 2013-02-16 NOTE — Assessment & Plan Note (Signed)
Has had extensive cardiac w/up as outlined.  Sob with exertion.  Will hold on repeat cardiac w/up.  She feels from a heart standpoint - things are stable.  CXR and CT chest as outlined.  Discussed further w/up including pulmonary function testing ,etc.  She wants to monitor for now.  Will notify me if symptoms change or if she changes her mind.

## 2013-02-16 NOTE — Assessment & Plan Note (Signed)
Blood pressure as outlined.  Continue same medication regimen.  Follow metabolic panel.

## 2013-02-16 NOTE — Assessment & Plan Note (Signed)
Last colonoscopy 06/23/11 revealed diverticulosis otherwise normal.  Recommended f/u colonoscopy in five years.

## 2013-02-16 NOTE — Assessment & Plan Note (Signed)
On pravastatin.  Low cholesterol diet and exercise.  Follow lipid panel and liver function.    

## 2013-02-16 NOTE — Assessment & Plan Note (Signed)
Continue vitamin D.  

## 2013-02-24 ENCOUNTER — Other Ambulatory Visit: Payer: Self-pay | Admitting: Internal Medicine

## 2013-02-24 ENCOUNTER — Other Ambulatory Visit (INDEPENDENT_AMBULATORY_CARE_PROVIDER_SITE_OTHER): Payer: No Typology Code available for payment source

## 2013-02-24 DIAGNOSIS — R739 Hyperglycemia, unspecified: Secondary | ICD-10-CM

## 2013-02-24 DIAGNOSIS — I10 Essential (primary) hypertension: Secondary | ICD-10-CM

## 2013-02-24 DIAGNOSIS — E7800 Pure hypercholesterolemia, unspecified: Secondary | ICD-10-CM

## 2013-02-24 LAB — LIPID PANEL
Cholesterol: 178 mg/dL (ref 0–200)
HDL: 56.8 mg/dL (ref >39.00)
LDL Cholesterol: 104 mg/dL — ABNORMAL HIGH (ref 0–99)
Total CHOL/HDL Ratio: 3
Triglycerides: 86 mg/dL (ref 0.0–149.0)

## 2013-02-24 LAB — HEPATIC FUNCTION PANEL
AST: 28 U/L (ref 0–37)
Albumin: 3.5 g/dL (ref 3.5–5.2)
Alkaline Phosphatase: 54 U/L (ref 39–117)
Total Bilirubin: 0.5 mg/dL (ref 0.3–1.2)
Total Protein: 6.5 g/dL (ref 6.0–8.3)

## 2013-02-24 LAB — BASIC METABOLIC PANEL
CO2: 31 mEq/L (ref 19–32)
Chloride: 105 mEq/L (ref 96–112)
Creatinine, Ser: 0.8 mg/dL (ref 0.4–1.2)
Sodium: 146 mEq/L — ABNORMAL HIGH (ref 135–145)

## 2013-02-24 NOTE — Progress Notes (Signed)
Order placed for f/u labs.  

## 2013-03-07 ENCOUNTER — Other Ambulatory Visit: Payer: Self-pay | Admitting: *Deleted

## 2013-03-07 ENCOUNTER — Telehealth: Payer: Self-pay | Admitting: Internal Medicine

## 2013-03-07 MED ORDER — CARVEDILOL 25 MG PO TABS: 25.0000 mg | ORAL_TABLET | Freq: Two times a day (BID) | ORAL | Status: DC

## 2013-03-07 NOTE — Telephone Encounter (Signed)
Pt notified that Rx has been sent in electronically

## 2013-03-07 NOTE — Telephone Encounter (Signed)
The patient only has 1 pill left.  carvedilol (COREG) 25 MG tablet

## 2013-03-24 ENCOUNTER — Other Ambulatory Visit: Payer: No Typology Code available for payment source

## 2013-04-08 ENCOUNTER — Telehealth: Payer: Self-pay | Admitting: Internal Medicine

## 2013-04-08 ENCOUNTER — Other Ambulatory Visit: Payer: No Typology Code available for payment source

## 2013-04-08 ENCOUNTER — Other Ambulatory Visit: Payer: Self-pay | Admitting: *Deleted

## 2013-04-08 MED ORDER — PRAVASTATIN SODIUM 20 MG PO TABS: 20.0000 mg | ORAL_TABLET | Freq: Every day | ORAL | Status: DC

## 2013-04-08 NOTE — Telephone Encounter (Signed)
pravastatin (PRAVACHOL) 20 MG tablet

## 2013-04-08 NOTE — Telephone Encounter (Signed)
Rx sent electronically.  

## 2013-06-12 ENCOUNTER — Ambulatory Visit: Payer: Self-pay | Admitting: General Surgery

## 2013-06-12 DIAGNOSIS — Z1231 Encounter for screening mammogram for malignant neoplasm of breast: Secondary | ICD-10-CM | POA: Diagnosis not present

## 2013-06-13 ENCOUNTER — Encounter: Payer: Self-pay | Admitting: General Surgery

## 2013-06-16 ENCOUNTER — Ambulatory Visit: Payer: No Typology Code available for payment source | Admitting: Internal Medicine

## 2013-06-23 ENCOUNTER — Ambulatory Visit: Payer: Self-pay | Admitting: General Surgery

## 2013-06-25 ENCOUNTER — Encounter: Payer: Self-pay | Admitting: General Surgery

## 2013-06-25 ENCOUNTER — Ambulatory Visit (INDEPENDENT_AMBULATORY_CARE_PROVIDER_SITE_OTHER): Payer: Medicare Other | Admitting: General Surgery

## 2013-06-25 VITALS — BP 118/64 | HR 72 | Resp 12 | Ht 61.0 in | Wt 166.0 lb

## 2013-06-25 DIAGNOSIS — N6019 Diffuse cystic mastopathy of unspecified breast: Secondary | ICD-10-CM | POA: Diagnosis not present

## 2013-06-25 NOTE — Progress Notes (Signed)
Patient ID: April [name], female   DOB: 17-Jul-1940, 73 y.o.   MRN: [id number]  Chief Complaint  Patient presents with  . Follow-up    mammogram    HPI April [name] is a 73 y.o. female who presents for a breast evaluation. The most recent mammogram was done on 06/15/13. Patient does perform regular self breast checks and gets regular mammograms done.    HPI  Past Medical History  Diagnosis Date  . Hypertension   . Hypercholesterolemia   . History of colon polyps 08/1994  . Osteoporosis   . Sleep apnea     CPAP  . Arrhythmia     history of an irregular heart beat  . Diverticulitis   . Urine incontinence   . Rheumatic fever   . Migraines     history of migraines  . Endometriosis     Past Surgical History  Procedure Laterality Date  . Abdominal hysterectomy      ovaries not removed  . Tonsillectomy    . Breast biopsies      x2  . Appendectomy      was removed during hysterectomy  . Breast surgery Bilateral   . Coronary angioplasty  2012    Family History  Problem Relation Age of Onset  . Arthritis Mother   . Stroke Mother   . Hypertension Mother   . Heart disease Father     MI  . Stroke Brother   . Osteoporosis Mother   . Breast cancer      first cousin x 2  . Colon cancer Neg Hx     Social History History  Substance Use Topics  . Smoking status: Never Smoker   . Smokeless tobacco: Never Used  . Alcohol Use: No    Allergies  Allergen Reactions  . Penicillins     Okay to take amoxicillin   . Sulfa Antibiotics     Current Outpatient Prescriptions  Medication Sig Dispense Refill  . aspirin 81 MG tablet Take 81 mg by mouth daily.      . calcium carbonate (TUMS - DOSED IN MG ELEMENTAL CALCIUM) 500 MG chewable tablet Chew 1 tablet by mouth daily.      . carvedilol (COREG) 25 MG tablet Take 1 tablet (25 mg total) by mouth 2 (two) times daily with a meal.  60 tablet  5  . esomeprazole (NEXIUM) 20 MG capsule Take 20 mg by mouth daily before  breakfast.      . fish oil-omega-3 fatty acids 1000 MG capsule Take 1 g by mouth daily.      Marland Kitchen losartan-hydrochlorothiazide (HYZAAR) 100-25 MG per tablet Take 1 tablet by mouth daily.      . Misc Natural Products (OSTEO BI-FLEX JOINT SHIELD PO) Take by mouth 2 (two) times daily.      . pravastatin (PRAVACHOL) 20 MG tablet Take 1 tablet (20 mg total) by mouth daily.  30 tablet  5  . triamcinolone (NASACORT) 55 MCG/ACT nasal inhaler Place 2 sprays into the nose daily as needed.        No current facility-administered medications for this visit.    Review of Systems Review of Systems  Constitutional: Negative.   Respiratory: Negative.   Cardiovascular: Negative.     Blood pressure 118/64, pulse 72, resp. rate 12, height 5\' 1"  (1.549 m), weight 166 lb (75.297 kg), last menstrual period 05/01/1972.  Physical Exam Physical Exam  Constitutional: She is oriented to person, place, and time. She appears well-developed  and well-nourished.  Eyes: No scleral icterus.  Neck: Neck supple.  Cardiovascular: Normal rate.  An irregular rhythm present.  Pulmonary/Chest: Effort normal and breath sounds normal. Right breast exhibits no inverted nipple, no mass, no nipple discharge, no skin change and no tenderness. Left breast exhibits no inverted nipple, no mass, no nipple discharge, no skin change and no tenderness.  Abdominal: Soft. There is no hepatomegaly. There is no tenderness.  Lymphadenopathy:    She has no cervical adenopathy.    She has no axillary adenopathy.  Neurological: She is alert and oriented to person, place, and time.  Skin: Skin is warm and dry.    Data Reviewed Mammogram reviewed Assessment    Stable exam     Plan    Patient to return in one year bilateral screening mammogram.       Jannie Doyle G 06/25/2013, 3:49 PM

## 2013-06-25 NOTE — Patient Instructions (Addendum)
Patient to return in one year bilateral screening mammogram. Continue with self breast exam monthly

## 2013-07-22 ENCOUNTER — Ambulatory Visit (INDEPENDENT_AMBULATORY_CARE_PROVIDER_SITE_OTHER): Payer: Medicare Other | Admitting: Internal Medicine

## 2013-07-22 ENCOUNTER — Encounter: Payer: Self-pay | Admitting: Internal Medicine

## 2013-07-22 VITALS — BP 130/80 | HR 55 | Temp 98.1°F | Ht 60.0 in | Wt 163.8 lb

## 2013-07-22 DIAGNOSIS — R739 Hyperglycemia, unspecified: Secondary | ICD-10-CM

## 2013-07-22 DIAGNOSIS — G4733 Obstructive sleep apnea (adult) (pediatric): Secondary | ICD-10-CM

## 2013-07-22 DIAGNOSIS — E7800 Pure hypercholesterolemia, unspecified: Secondary | ICD-10-CM

## 2013-07-22 DIAGNOSIS — I10 Essential (primary) hypertension: Secondary | ICD-10-CM

## 2013-07-22 DIAGNOSIS — R7309 Other abnormal glucose: Secondary | ICD-10-CM

## 2013-07-22 DIAGNOSIS — R0781 Pleurodynia: Secondary | ICD-10-CM

## 2013-07-22 DIAGNOSIS — M810 Age-related osteoporosis without current pathological fracture: Secondary | ICD-10-CM | POA: Diagnosis not present

## 2013-07-22 DIAGNOSIS — R079 Chest pain, unspecified: Secondary | ICD-10-CM

## 2013-07-22 DIAGNOSIS — R0602 Shortness of breath: Secondary | ICD-10-CM

## 2013-07-22 DIAGNOSIS — Z733 Stress, not elsewhere classified: Secondary | ICD-10-CM

## 2013-07-22 DIAGNOSIS — F439 Reaction to severe stress, unspecified: Secondary | ICD-10-CM

## 2013-07-22 DIAGNOSIS — Z86010 Personal history of colonic polyps: Secondary | ICD-10-CM

## 2013-07-22 LAB — BASIC METABOLIC PANEL
BUN: 27 mg/dL — AB (ref 6–23)
CO2: 30 mEq/L (ref 19–32)
CREATININE: 0.9 mg/dL (ref 0.4–1.2)
Calcium: 9.6 mg/dL (ref 8.4–10.5)
Chloride: 105 mEq/L (ref 96–112)
GFR: 66.63 mL/min (ref >60.00)
GLUCOSE: 102 mg/dL — AB (ref 70–99)
POTASSIUM: 4.7 meq/L (ref 3.5–5.1)
Sodium: 142 mEq/L (ref 135–145)

## 2013-07-22 LAB — HEPATIC FUNCTION PANEL
ALT: 18 U/L (ref 0–35)
AST: 26 U/L (ref 0–37)
Albumin: 3.8 g/dL (ref 3.5–5.2)
Alkaline Phosphatase: 58 U/L (ref 39–117)
BILIRUBIN DIRECT: 0.1 mg/dL (ref 0.0–0.3)
BILIRUBIN TOTAL: 0.8 mg/dL (ref 0.3–1.2)
Total Protein: 7 g/dL (ref 6.0–8.3)

## 2013-07-22 LAB — LIPID PANEL
CHOLESTEROL: 202 mg/dL — AB (ref 0–200)
HDL: 54.3 mg/dL (ref >39.00)
LDL Cholesterol: 130 mg/dL — ABNORMAL HIGH (ref 0–99)
Total CHOL/HDL Ratio: 4
Triglycerides: 88 mg/dL (ref 0.0–149.0)
VLDL: 17.6 mg/dL (ref 0.0–40.0)

## 2013-07-22 LAB — HEMOGLOBIN A1C: Hgb A1c MFr Bld: 6.4 % (ref 4.6–6.5)

## 2013-07-22 NOTE — Assessment & Plan Note (Signed)
Last colonoscopy 06/23/11 revealed diverticulosis otherwise normal.  Recommended f/u colonoscopy in five years.

## 2013-07-22 NOTE — Assessment & Plan Note (Addendum)
Continue with CPAP

## 2013-07-22 NOTE — Assessment & Plan Note (Signed)
Has had extensive cardiac w/up as outlined.  She feels from a heart standpoint - things are stable.  CXR and CT chest as outlined.  Feels breathing stable.

## 2013-07-22 NOTE — Progress Notes (Signed)
Subjective:    Patient ID: April [name], female    DOB: [birth year], 73 y.o.   MRN: [id number]  HPI 73 year old female with past history of hypertension, hypercholesterolemia, sleep apnea and osteoporosis who comes in today for a scheduled follow up.  She states she is doing relatively well.  Increased stress dealing with her husbands recent death and her daughter's medical issues.  Does not feel she needs any further intervention at this time.  She sees Dr Jamal Collin for her breast.  Had follow up with him 2/15.  Sees him once a year. Everything checked out fine.   Had a colonoscopy 06/23/11.  Diverticulosis.  Had a heart cath in 2013 - Dr Saralyn Pilar.  This was done secondary to problems she was having with sweating - head to toe.  Would feel light headed.  Had a questionable stress test.  States cath ok.  She feels her breathing is stable.  No chest pain or tightness.  Eating and drinking well.  Bowels stable.  On nexium.  She had an abnormal cxr and subsequently had a CT chest.  Has a large hiatal hernia.  Feels the acid reflux is stable/controlled.  Swallowing ok.  She is having reoccurring right posterior lateral rib pain.  States aggravated it again recently.  Seeing a Restaurant manager, fast food.  Feels better.  Desires no further intervention.       Past Medical History  Diagnosis Date  . Hypertension   . Hypercholesterolemia   . History of colon polyps 08/1994  . Osteoporosis   . Sleep apnea     CPAP  . Arrhythmia     history of an irregular heart beat  . Diverticulitis   . Urine incontinence   . Rheumatic fever   . Migraines     history of migraines  . Endometriosis     Current Outpatient Prescriptions on File Prior to Visit  Medication Sig Dispense Refill  . aspirin 81 MG tablet Take 81 mg by mouth daily.      . calcium carbonate (TUMS - DOSED IN MG ELEMENTAL CALCIUM) 500 MG chewable tablet Chew 1 tablet by mouth daily.      . carvedilol (COREG) 25 MG tablet Take 1 tablet (25 mg total) by mouth  2 (two) times daily with a meal.  60 tablet  5  . esomeprazole (NEXIUM) 20 MG capsule Take 20 mg by mouth daily before breakfast.      . fish oil-omega-3 fatty acids 1000 MG capsule Take 1 g by mouth daily.      Marland Kitchen losartan-hydrochlorothiazide (HYZAAR) 100-25 MG per tablet Take 1 tablet by mouth daily.      . Misc Natural Products (OSTEO BI-FLEX JOINT SHIELD PO) Take by mouth 2 (two) times daily.      . pravastatin (PRAVACHOL) 20 MG tablet Take 1 tablet (20 mg total) by mouth daily.  30 tablet  5  . triamcinolone (NASACORT) 55 MCG/ACT nasal inhaler Place 2 sprays into the nose daily as needed.        No current facility-administered medications on file prior to visit.    Review of Systems Patient denies any headache, lightheadedness or dizziness.  No sinus or allergy symptoms currently.  No chest pain, tightness or palpitations reported.   No increased cough or congestion.  Breathing stable.   Rib pain as outlined.  Improved.  Acid reflux controlled.   Better.  No nausea or vomiting.  No abdominal pain or cramping.  No bowel change, such  as diarrhea, constipation, BRBPR or melana.  No urine change.  Feels she is handling stress relatively well.        Objective:   Physical Exam  Filed Vitals:   07/22/13 0801  BP: 130/80  Pulse: 55  Temp: 98.1 F (36.7 C)   Blood pressure recheck:  75/91  73 year old female in no acute distress.   HEENT:  Nares- clear.  Oropharynx - without lesions. NECK:  Supple.  Nontender.  No audible bruit.  HEART:  Appears to be regular. LUNGS:  No crackles or wheezing audible.  Respirations even and unlabored.  Good breath sounds bilaterally.   RADIAL PULSE:  Equal bilaterally.    RIBS:  Minimal pain right lateral rib.  Good breath sounds bilaterally.   ABDOMEN:  Soft, nontender.  Bowel sounds present and normal.  No audible abdominal bruit.    EXTREMITIES:  No increased edema present.  DP pulses palpable and equal bilaterally.  MSk:  Increased pain over  right posterior lateral rib.           Assessment & Plan:  HEALTH MAINTENANCE.  Physical 10/11/12.   Colonoscopy 06/23/11.  Pneumovax 06/12/05, Tdap 3/12 and Zostavax 2008.  Mammogram 06/25/13.  Followed by Dr Jamal Collin.

## 2013-07-22 NOTE — Assessment & Plan Note (Addendum)
On pravastatin.  Low cholesterol diet and exercise.  Follow lipid panel and liver function.    

## 2013-07-22 NOTE — Assessment & Plan Note (Addendum)
Continue vitamin D.  

## 2013-07-22 NOTE — Assessment & Plan Note (Signed)
Reaggravated recently.  Seeing chiropractor.  Desires no further intervention.  Follow.

## 2013-07-22 NOTE — Assessment & Plan Note (Signed)
Feels she is handling things relatively well.  Follow.  Desires no further intervention.

## 2013-07-22 NOTE — Progress Notes (Signed)
Pre-visit discussion using our clinic review tool. No additional management support is needed unless otherwise documented below in the visit note.  

## 2013-07-22 NOTE — Assessment & Plan Note (Addendum)
Blood pressure as outlined.  Continue same medication regimen.  Follow metabolic panel.

## 2013-07-23 LAB — GLUCOSE, FASTING: Glucose, Fasting: 99 mg/dL (ref 70–99)

## 2013-07-29 ENCOUNTER — Encounter: Payer: Self-pay | Admitting: *Deleted

## 2013-09-29 ENCOUNTER — Other Ambulatory Visit: Payer: Self-pay | Admitting: Internal Medicine

## 2013-10-21 ENCOUNTER — Other Ambulatory Visit: Payer: Self-pay | Admitting: Internal Medicine

## 2013-10-21 ENCOUNTER — Encounter: Payer: Self-pay | Admitting: Internal Medicine

## 2013-10-21 ENCOUNTER — Ambulatory Visit (INDEPENDENT_AMBULATORY_CARE_PROVIDER_SITE_OTHER): Payer: Medicare Other | Admitting: Internal Medicine

## 2013-10-21 VITALS — BP 100/60 | HR 68 | Temp 98.7°F | Ht 60.5 in | Wt 164.8 lb

## 2013-10-21 DIAGNOSIS — G4733 Obstructive sleep apnea (adult) (pediatric): Secondary | ICD-10-CM

## 2013-10-21 DIAGNOSIS — R5383 Other fatigue: Secondary | ICD-10-CM

## 2013-10-21 DIAGNOSIS — K219 Gastro-esophageal reflux disease without esophagitis: Secondary | ICD-10-CM

## 2013-10-21 DIAGNOSIS — Z860100 Personal history of colon polyps, unspecified: Secondary | ICD-10-CM

## 2013-10-21 DIAGNOSIS — R739 Hyperglycemia, unspecified: Secondary | ICD-10-CM

## 2013-10-21 DIAGNOSIS — F439 Reaction to severe stress, unspecified: Secondary | ICD-10-CM

## 2013-10-21 DIAGNOSIS — R7309 Other abnormal glucose: Secondary | ICD-10-CM

## 2013-10-21 DIAGNOSIS — R0602 Shortness of breath: Secondary | ICD-10-CM

## 2013-10-21 DIAGNOSIS — I10 Essential (primary) hypertension: Secondary | ICD-10-CM | POA: Diagnosis not present

## 2013-10-21 DIAGNOSIS — R5381 Other malaise: Secondary | ICD-10-CM

## 2013-10-21 DIAGNOSIS — E7800 Pure hypercholesterolemia, unspecified: Secondary | ICD-10-CM

## 2013-10-21 DIAGNOSIS — Z733 Stress, not elsewhere classified: Secondary | ICD-10-CM

## 2013-10-21 DIAGNOSIS — M810 Age-related osteoporosis without current pathological fracture: Secondary | ICD-10-CM

## 2013-10-21 MED ORDER — LOSARTAN POTASSIUM-HCTZ 100-12.5 MG PO TABS: 1.0000 | ORAL_TABLET | Freq: Every day | ORAL | Status: DC

## 2013-10-21 NOTE — Progress Notes (Signed)
Pre visit review using our clinic review tool, if applicable. No additional management support is needed unless otherwise documented below in the visit note. 

## 2013-10-21 NOTE — Patient Instructions (Signed)
Stop the losartan/hctz 100/25.  Start losartan 100/12.5 - one per day.

## 2013-10-21 NOTE — Progress Notes (Signed)
Subjective:    Patient ID: April [name], female    DOB: 17-Jun-1940, 73 y.o.   MRN: [id number]  HPI 73 year old female with past history of hypertension, hypercholesterolemia, sleep apnea and osteoporosis who comes in today to follow up on these issues as well as for a complete physical exam.  She states she is doing relatively well.  Increased stress dealing with her husbands recent death and her daughter's medical issues.  Does not feel she needs any further intervention at this time.  Feels she is doing some better.  She sees Dr Jamal Collin for her breast.  Had follow up with him 2/15.  Sees him once a year. Everything checked out fine.   Had a colonoscopy 06/23/11.  Diverticulosis.  Had a heart cath in 2013 - Dr Saralyn Pilar.  This was done secondary to problems she was having with sweating - head to toe.  Would feel light headed.  Had a questionable stress test.  States cath ok.  Does report noticing some sob.  Notices with exertion.   No chest pain or tightness.  Eating and drinking well.  Bowels stable.  On nexium.  She had an abnormal cxr and subsequently had a CT chest.  Has a large hiatal hernia.  Feels the acid reflux is stable/controlled.  Swallowing ok.       Past Medical History  Diagnosis Date  . Hypertension   . Hypercholesterolemia   . History of colon polyps 08/1994  . Osteoporosis   . Sleep apnea     CPAP  . Arrhythmia     history of an irregular heart beat  . Diverticulitis   . Urine incontinence   . Rheumatic fever   . Migraines     history of migraines  . Endometriosis     Current Outpatient Prescriptions on File Prior to Visit  Medication Sig Dispense Refill  . aspirin 81 MG tablet Take 81 mg by mouth daily.      . calcium carbonate (TUMS - DOSED IN MG ELEMENTAL CALCIUM) 500 MG chewable tablet Chew 1 tablet by mouth daily as needed.       . carvedilol (COREG) 25 MG tablet TAKE ONE TABLET TWICE DAILY WITH A MEAL  60 tablet  5  . esomeprazole (NEXIUM) 20 MG capsule Take  20 mg by mouth daily as needed.       . fish oil-omega-3 fatty acids 1000 MG capsule Take 1 g by mouth daily.      Marland Kitchen losartan-hydrochlorothiazide (HYZAAR) 100-25 MG per tablet Take 1 tablet by mouth daily.      . Misc Natural Products (OSTEO BI-FLEX JOINT SHIELD PO) Take by mouth 2 (two) times daily.      . Multiple Vitamin (MULTIVITAMIN) tablet Take 1 tablet by mouth daily.      . pravastatin (PRAVACHOL) 20 MG tablet Take 1 tablet (20 mg total) by mouth daily.  30 tablet  5  . triamcinolone (NASACORT) 55 MCG/ACT nasal inhaler Place 2 sprays into the nose daily as needed.       Marland Kitchen VITAMIN D, CHOLECALCIFEROL, PO Take 1,000 Units by mouth 2 (two) times daily.        No current facility-administered medications on file prior to visit.    Review of Systems Patient denies any headache.  Some intermittent light headedness as outlined.   No sinus or allergy symptoms currently.  No chest pain, tightness or palpitations reported.   No increased cough or congestion.  SOB with exertion  as outlined.  Acid reflux controlled.    No nausea or vomiting.  No abdominal pain or cramping.  No bowel change, such as diarrhea, constipation, BRBPR or melana.  No urine change.  Feels she is handling stress relatively well.        Objective:   Physical Exam  Filed Vitals:   10/21/13 1341  BP: 100/60  Pulse: 68  Temp: 98.7 F (37.1 C)   Blood pressure recheck:  104/64 standing and 120/64 lying, pulse 70  73 year old female in no acute distress.   HEENT:  Nares- clear.  Oropharynx - without lesions. NECK:  Supple.  Nontender.  No audible bruit.  HEART:  Appears to be regular. LUNGS:  No crackles or wheezing audible.  Respirations even and unlabored.  RADIAL PULSE:  Equal bilaterally.    BREASTS:  No nipple discharge or nipple retraction present.  Could not appreciate any distinct nodules or axillary adenopathy.  ABDOMEN:  Soft, nontender.  Bowel sounds present and normal.  No audible abdominal bruit.  GU:   Not performed.     EXTREMITIES:  No increased edema present.  DP pulses palpable and equal bilaterally.          Assessment & Plan:  HEALTH MAINTENANCE.  Physical today.   Colonoscopy 06/23/11.  Pneumovax 06/12/05, Tdap 3/12 and Zostavax 2008.  Mammogram 06/25/13.  Followed by Dr Jamal Collin.    I spent 25 minutes with the patient and more than 50% of the time was spent in consultation regarding the above.

## 2013-10-23 DIAGNOSIS — H04209 Unspecified epiphora, unspecified lacrimal gland: Secondary | ICD-10-CM | POA: Diagnosis not present

## 2013-10-26 ENCOUNTER — Encounter: Payer: Self-pay | Admitting: Internal Medicine

## 2013-10-26 NOTE — Assessment & Plan Note (Signed)
Avoid eating late and avoid foods that aggravate.  On nexium now.  Doing better.  Follow.

## 2013-10-26 NOTE — Assessment & Plan Note (Signed)
Blood pressure as outlined.  Running lower.  Stay hydrated.  Some lightheadedness.  Will change losartan/hctz to 100/12.5.  Follow pressures.   Follow metabolic panel.

## 2013-10-26 NOTE — Assessment & Plan Note (Signed)
Continue vitamin D.  

## 2013-10-26 NOTE — Assessment & Plan Note (Signed)
Continue with CPAP

## 2013-10-26 NOTE — Assessment & Plan Note (Signed)
Has had cardiac w/up previously as outlined.  Has been several years.  CXR and CT chest as outlined.  Some increased sob with exertion.  EKG obtained and revealed SR with minimal TWI in III and flattening Twaves in aVF. Will obtain echo to further evaluate.  Pt comfortable with this plan.

## 2013-10-26 NOTE — Assessment & Plan Note (Signed)
Feels she is handling things relatively well.  Follow.  Desires no further intervention.

## 2013-10-26 NOTE — Assessment & Plan Note (Signed)
On pravastatin.  Low cholesterol diet and exercise.  Follow lipid panel and liver function.    

## 2013-10-26 NOTE — Assessment & Plan Note (Signed)
Last colonoscopy 06/23/11 revealed diverticulosis otherwise normal.  Recommended f/u colonoscopy in five years.

## 2013-10-29 HISTORY — PX: TRANSTHORACIC ECHOCARDIOGRAM: SHX275

## 2013-11-06 ENCOUNTER — Telehealth: Payer: Self-pay

## 2013-11-06 NOTE — Telephone Encounter (Signed)
Called patient, she is scheduled for an 2D Echo at [facility] for July 24th at 3:30pm.  Patient is aware of apt details.

## 2013-11-06 NOTE — Telephone Encounter (Signed)
Noted  

## 2013-11-21 ENCOUNTER — Other Ambulatory Visit: Payer: Self-pay

## 2013-11-21 ENCOUNTER — Other Ambulatory Visit (INDEPENDENT_AMBULATORY_CARE_PROVIDER_SITE_OTHER): Payer: Medicare Other

## 2013-11-21 DIAGNOSIS — I059 Rheumatic mitral valve disease, unspecified: Secondary | ICD-10-CM

## 2013-11-21 DIAGNOSIS — R0602 Shortness of breath: Secondary | ICD-10-CM

## 2013-11-21 DIAGNOSIS — R0609 Other forms of dyspnea: Secondary | ICD-10-CM

## 2013-11-21 DIAGNOSIS — R0989 Other specified symptoms and signs involving the circulatory and respiratory systems: Secondary | ICD-10-CM

## 2013-11-24 ENCOUNTER — Encounter: Payer: Self-pay | Admitting: *Deleted

## 2013-12-03 ENCOUNTER — Other Ambulatory Visit (INDEPENDENT_AMBULATORY_CARE_PROVIDER_SITE_OTHER): Payer: Medicare Other

## 2013-12-03 ENCOUNTER — Telehealth: Payer: Self-pay | Admitting: Internal Medicine

## 2013-12-03 DIAGNOSIS — R0602 Shortness of breath: Secondary | ICD-10-CM | POA: Diagnosis not present

## 2013-12-03 DIAGNOSIS — R5383 Other fatigue: Secondary | ICD-10-CM

## 2013-12-03 DIAGNOSIS — R5381 Other malaise: Secondary | ICD-10-CM

## 2013-12-03 DIAGNOSIS — R7309 Other abnormal glucose: Secondary | ICD-10-CM | POA: Diagnosis not present

## 2013-12-03 DIAGNOSIS — R739 Hyperglycemia, unspecified: Secondary | ICD-10-CM

## 2013-12-03 DIAGNOSIS — I10 Essential (primary) hypertension: Secondary | ICD-10-CM | POA: Diagnosis not present

## 2013-12-03 DIAGNOSIS — E7800 Pure hypercholesterolemia, unspecified: Secondary | ICD-10-CM | POA: Diagnosis not present

## 2013-12-03 LAB — CBC WITH DIFFERENTIAL/PLATELET
BASOS ABS: 0 10*3/uL (ref 0.0–0.1)
Basophils Relative: 0.6 % (ref 0.0–3.0)
Eosinophils Absolute: 0.2 10*3/uL (ref 0.0–0.7)
Eosinophils Relative: 3.5 % (ref 0.0–5.0)
HEMATOCRIT: 37.9 % (ref 36.0–46.0)
Hemoglobin: 12.7 g/dL (ref 12.0–15.0)
LYMPHS ABS: 2.6 10*3/uL (ref 0.7–4.0)
Lymphocytes Relative: 42.2 % (ref 12.0–46.0)
MCHC: 33.4 g/dL (ref 30.0–36.0)
MCV: 89.6 fl (ref 78.0–100.0)
MONO ABS: 0.6 10*3/uL (ref 0.1–1.0)
Monocytes Relative: 9.4 % (ref 3.0–12.0)
Neutro Abs: 2.7 10*3/uL (ref 1.4–7.7)
Neutrophils Relative %: 44.3 % (ref 43.0–77.0)
Platelets: 225 10*3/uL (ref 150.0–400.0)
RBC: 4.23 Mil/uL (ref 3.87–5.11)
RDW: 13.8 % (ref 11.5–15.5)
WBC: 6.1 10*3/uL (ref 4.0–10.5)

## 2013-12-03 LAB — BASIC METABOLIC PANEL
BUN: 11 mg/dL (ref 6–23)
CALCIUM: 9.5 mg/dL (ref 8.4–10.5)
CO2: 29 meq/L (ref 19–32)
Chloride: 106 mEq/L (ref 96–112)
Creatinine, Ser: 0.8 mg/dL (ref 0.4–1.2)
GFR: 74.3 mL/min (ref >60.00)
GLUCOSE: 93 mg/dL (ref 70–99)
Potassium: 4.8 mEq/L (ref 3.5–5.1)
SODIUM: 143 meq/L (ref 135–145)

## 2013-12-03 LAB — LIPID PANEL
CHOLESTEROL: 190 mg/dL (ref 0–200)
HDL: 50.3 mg/dL (ref >39.00)
LDL Cholesterol: 120 mg/dL — ABNORMAL HIGH (ref 0–99)
NonHDL: 139.7
TRIGLYCERIDES: 101 mg/dL (ref 0.0–149.0)
Total CHOL/HDL Ratio: 4
VLDL: 20.2 mg/dL (ref 0.0–40.0)

## 2013-12-03 LAB — HEPATIC FUNCTION PANEL
ALT: 18 U/L (ref 0–35)
AST: 30 U/L (ref 0–37)
Albumin: 3.6 g/dL (ref 3.5–5.2)
Alkaline Phosphatase: 62 U/L (ref 39–117)
BILIRUBIN TOTAL: 0.8 mg/dL (ref 0.2–1.2)
Bilirubin, Direct: 0.1 mg/dL (ref 0.0–0.3)
Total Protein: 6.5 g/dL (ref 6.0–8.3)

## 2013-12-03 LAB — TSH: TSH: 1.4 u[IU]/mL (ref 0.35–4.50)

## 2013-12-03 LAB — HEMOGLOBIN A1C: Hgb A1c MFr Bld: 6.2 % (ref 4.6–6.5)

## 2013-12-03 NOTE — Telephone Encounter (Signed)
Pt request a copy of her last EKG on her next visit to take to the eye doctor

## 2013-12-03 NOTE — Telephone Encounter (Signed)
noted 

## 2013-12-09 ENCOUNTER — Encounter: Payer: Self-pay | Admitting: Internal Medicine

## 2013-12-09 ENCOUNTER — Ambulatory Visit (INDEPENDENT_AMBULATORY_CARE_PROVIDER_SITE_OTHER): Payer: Medicare Other | Admitting: Internal Medicine

## 2013-12-09 VITALS — BP 130/70 | HR 69 | Temp 98.5°F | Ht 60.5 in | Wt 163.5 lb

## 2013-12-09 DIAGNOSIS — K219 Gastro-esophageal reflux disease without esophagitis: Secondary | ICD-10-CM

## 2013-12-09 DIAGNOSIS — G4733 Obstructive sleep apnea (adult) (pediatric): Secondary | ICD-10-CM | POA: Diagnosis not present

## 2013-12-09 DIAGNOSIS — I10 Essential (primary) hypertension: Secondary | ICD-10-CM

## 2013-12-09 DIAGNOSIS — Z733 Stress, not elsewhere classified: Secondary | ICD-10-CM

## 2013-12-09 DIAGNOSIS — M810 Age-related osteoporosis without current pathological fracture: Secondary | ICD-10-CM

## 2013-12-09 DIAGNOSIS — F439 Reaction to severe stress, unspecified: Secondary | ICD-10-CM

## 2013-12-09 DIAGNOSIS — E7800 Pure hypercholesterolemia, unspecified: Secondary | ICD-10-CM

## 2013-12-09 DIAGNOSIS — R0602 Shortness of breath: Secondary | ICD-10-CM

## 2013-12-09 DIAGNOSIS — Z86010 Personal history of colonic polyps: Secondary | ICD-10-CM

## 2013-12-09 NOTE — Progress Notes (Signed)
Pre visit review using our clinic review tool, if applicable. No additional management support is needed unless otherwise documented below in the visit note. 

## 2013-12-14 ENCOUNTER — Encounter: Payer: Self-pay | Admitting: Internal Medicine

## 2013-12-14 NOTE — Assessment & Plan Note (Signed)
Was having some lightheadedness.  See last note for details.   Was changed to losartan/hctz 100/12.5.  Blood pressure as outlined.  Have her spot check her pressure.  Hold on adjustments.  Follow metabolic panel.

## 2013-12-14 NOTE — Assessment & Plan Note (Signed)
Continue vitamin D.  

## 2013-12-14 NOTE — Assessment & Plan Note (Signed)
On pravastatin.  Low cholesterol diet and exercise.  Follow lipid panel and liver function.  LDL last checked 120,

## 2013-12-14 NOTE — Assessment & Plan Note (Signed)
Last colonoscopy 06/23/11 revealed diverticulosis otherwise normal.  Recommended f/u colonoscopy in five years.

## 2013-12-14 NOTE — Assessment & Plan Note (Signed)
Feels she is handling things relatively well.  Follow.  Desires no further intervention.

## 2013-12-14 NOTE — Progress Notes (Signed)
Subjective:    Patient ID: April [name], female    DOB: [birth year], 73 y.o.   MRN: [id number]  HPI 73 year old female with past history of hypertension, hypercholesterolemia, sleep apnea and osteoporosis who comes in today for a scheduled follow up.   She states she is doing relatively well.  Increased stress dealing with her husbands recent death and her daughter's medical issues.  Does not feel she needs any further intervention at this time.  Feels she is doing some better.  Has good and bad days.  She sees Dr Jamal Collin for her breast.  Had follow up with him 2/15.  Sees him once a year. Everything checked out fine.   Had a colonoscopy 06/23/11.  Diverticulosis.  Had a heart cath in 2013 - Dr Saralyn Pilar.  This was done secondary to problems she was having with sweating - head to toe.  Would feel light headed.  Had a questionable stress test.  States cath ok.   No chest pain or tightness.  Breathing stable.  Eating and drinking well.  Bowels stable.  On nexium.  She had an abnormal cxr and subsequently had a CT chest.  Has a large hiatal hernia.  Feels the acid reflux is stable/controlled.  Swallowing ok.       Past Medical History  Diagnosis Date  . Hypertension   . Hypercholesterolemia   . History of colon polyps 08/1994  . Osteoporosis   . Sleep apnea     CPAP  . Arrhythmia     history of an irregular heart beat  . Diverticulitis   . Urine incontinence   . Rheumatic fever   . Migraines     history of migraines  . Endometriosis     Current Outpatient Prescriptions on File Prior to Visit  Medication Sig Dispense Refill  . aspirin 81 MG tablet Take 81 mg by mouth daily.      . calcium carbonate (TUMS - DOSED IN MG ELEMENTAL CALCIUM) 500 MG chewable tablet Chew 1 tablet by mouth daily as needed.       . carvedilol (COREG) 25 MG tablet TAKE ONE TABLET TWICE DAILY WITH A MEAL  60 tablet  5  . esomeprazole (NEXIUM) 20 MG capsule Take 20 mg by mouth daily as needed.       . fish  oil-omega-3 fatty acids 1000 MG capsule Take 1 g by mouth daily.      Marland Kitchen losartan-hydrochlorothiazide (HYZAAR) 100-12.5 MG per tablet Take 1 tablet by mouth daily.  30 tablet  3  . Misc Natural Products (OSTEO BI-FLEX JOINT SHIELD PO) Take by mouth 2 (two) times daily.      . Multiple Vitamin (MULTIVITAMIN) tablet Take 1 tablet by mouth daily.      . pravastatin (PRAVACHOL) 20 MG tablet TAKE ONE (1) TABLET EACH DAY  30 tablet  3  . triamcinolone (NASACORT) 55 MCG/ACT nasal inhaler Place 2 sprays into the nose daily as needed.       Marland Kitchen VITAMIN D, CHOLECALCIFEROL, PO Take 1,000 Units by mouth 2 (two) times daily.        No current facility-administered medications on file prior to visit.    Review of Systems Patient denies any headache.  No light headedness reported today.  No sinus or allergy symptoms currently.  No chest pain, tightness or palpitations reported.   No increased cough or congestion.  Feels her breathing is stable.  Acid reflux controlled.    No nausea or vomiting.  No abdominal pain or cramping.  No bowel change, such as diarrhea, constipation, BRBPR or melana.  No urine change.  Feels she is handling stress relatively well.        Objective:   Physical Exam  Filed Vitals:   12/09/13 1503  BP: 130/70  Pulse: 69  Temp: 98.5 F (6.48 C)   73 year old female in no acute distress.   HEENT:  Nares- clear.  Oropharynx - without lesions. NECK:  Supple.  Nontender.  No audible bruit.  HEART:  Appears to be regular. LUNGS:  No crackles or wheezing audible.  Respirations even and unlabored.  RADIAL PULSE:  Equal bilaterally.   ABDOMEN:  Soft, nontender.  Bowel sounds present and normal.  No audible abdominal bruit.     EXTREMITIES:  No increased edema present.  DP pulses palpable and equal bilaterally.          Assessment & Plan:  HEALTH MAINTENANCE.  Physical 10/21/13.   Colonoscopy 06/23/11.  Pneumovax 06/12/05, Tdap 3/12 and Zostavax 2008.  Mammogram 06/25/13.  Followed by Dr  Jamal Collin.    I spent 25 minutes with the patient and more than 50% of the time was spent in consultation regarding the above.

## 2013-12-14 NOTE — Assessment & Plan Note (Signed)
Has had cardiac w/up previously as outlined.  Has been several years.  CXR and CT chest as outlined.  After last visit, had ECHO.  ECHO revealed EF 65% with grade I diastolic dysfunction.  Pulmonary artery pressure at the upper limits of normal.  Continue CPAP for sleep apnea.  Mild mitral regurgitation.  Breathing stable.

## 2013-12-14 NOTE — Assessment & Plan Note (Signed)
Avoid eating late and avoid foods that aggravate.  On nexium.  Symptoms controlled.  Follow.

## 2013-12-14 NOTE — Assessment & Plan Note (Signed)
Continue with CPAP

## 2013-12-17 DIAGNOSIS — H2510 Age-related nuclear cataract, unspecified eye: Secondary | ICD-10-CM | POA: Diagnosis not present

## 2013-12-29 ENCOUNTER — Ambulatory Visit: Payer: Self-pay | Admitting: Ophthalmology

## 2013-12-29 DIAGNOSIS — G4730 Sleep apnea, unspecified: Secondary | ICD-10-CM | POA: Diagnosis not present

## 2013-12-29 DIAGNOSIS — R0602 Shortness of breath: Secondary | ICD-10-CM | POA: Diagnosis not present

## 2013-12-29 DIAGNOSIS — Z880 Allergy status to penicillin: Secondary | ICD-10-CM | POA: Diagnosis not present

## 2013-12-29 DIAGNOSIS — E7800 Pure hypercholesterolemia, unspecified: Secondary | ICD-10-CM | POA: Diagnosis not present

## 2013-12-29 DIAGNOSIS — R059 Cough, unspecified: Secondary | ICD-10-CM | POA: Diagnosis not present

## 2013-12-29 DIAGNOSIS — I10 Essential (primary) hypertension: Secondary | ICD-10-CM | POA: Diagnosis not present

## 2013-12-29 DIAGNOSIS — H2510 Age-related nuclear cataract, unspecified eye: Secondary | ICD-10-CM | POA: Diagnosis not present

## 2013-12-29 DIAGNOSIS — Z882 Allergy status to sulfonamides status: Secondary | ICD-10-CM | POA: Diagnosis not present

## 2013-12-29 DIAGNOSIS — M810 Age-related osteoporosis without current pathological fracture: Secondary | ICD-10-CM | POA: Diagnosis not present

## 2013-12-29 DIAGNOSIS — K449 Diaphragmatic hernia without obstruction or gangrene: Secondary | ICD-10-CM | POA: Diagnosis not present

## 2013-12-29 DIAGNOSIS — H269 Unspecified cataract: Secondary | ICD-10-CM | POA: Diagnosis not present

## 2013-12-29 DIAGNOSIS — I252 Old myocardial infarction: Secondary | ICD-10-CM | POA: Diagnosis not present

## 2013-12-29 DIAGNOSIS — I498 Other specified cardiac arrhythmias: Secondary | ICD-10-CM | POA: Diagnosis not present

## 2013-12-29 DIAGNOSIS — R609 Edema, unspecified: Secondary | ICD-10-CM | POA: Diagnosis not present

## 2013-12-29 DIAGNOSIS — Z7982 Long term (current) use of aspirin: Secondary | ICD-10-CM | POA: Diagnosis not present

## 2013-12-29 DIAGNOSIS — K219 Gastro-esophageal reflux disease without esophagitis: Secondary | ICD-10-CM | POA: Diagnosis not present

## 2013-12-29 DIAGNOSIS — Z79899 Other long term (current) drug therapy: Secondary | ICD-10-CM | POA: Diagnosis not present

## 2013-12-29 DIAGNOSIS — R002 Palpitations: Secondary | ICD-10-CM | POA: Diagnosis not present

## 2013-12-29 DIAGNOSIS — R42 Dizziness and giddiness: Secondary | ICD-10-CM | POA: Diagnosis not present

## 2014-02-11 ENCOUNTER — Ambulatory Visit: Payer: Medicare Other | Admitting: Internal Medicine

## 2014-02-23 ENCOUNTER — Other Ambulatory Visit: Payer: Self-pay | Admitting: Internal Medicine

## 2014-03-02 ENCOUNTER — Encounter: Payer: Self-pay | Admitting: Internal Medicine

## 2014-03-23 ENCOUNTER — Ambulatory Visit: Payer: Self-pay | Admitting: Ophthalmology

## 2014-03-23 DIAGNOSIS — Z01812 Encounter for preprocedural laboratory examination: Secondary | ICD-10-CM | POA: Diagnosis not present

## 2014-03-23 DIAGNOSIS — H2511 Age-related nuclear cataract, right eye: Secondary | ICD-10-CM | POA: Diagnosis not present

## 2014-03-23 LAB — POTASSIUM: Potassium: 3.9 mmol/L (ref 3.5–5.1)

## 2014-03-24 ENCOUNTER — Other Ambulatory Visit: Payer: Self-pay | Admitting: Internal Medicine

## 2014-04-06 ENCOUNTER — Ambulatory Visit: Payer: Self-pay | Admitting: Ophthalmology

## 2014-04-06 DIAGNOSIS — Z79899 Other long term (current) drug therapy: Secondary | ICD-10-CM | POA: Diagnosis not present

## 2014-04-06 DIAGNOSIS — I252 Old myocardial infarction: Secondary | ICD-10-CM | POA: Diagnosis not present

## 2014-04-06 DIAGNOSIS — R609 Edema, unspecified: Secondary | ICD-10-CM | POA: Diagnosis not present

## 2014-04-06 DIAGNOSIS — I10 Essential (primary) hypertension: Secondary | ICD-10-CM | POA: Diagnosis not present

## 2014-04-06 DIAGNOSIS — Z882 Allergy status to sulfonamides status: Secondary | ICD-10-CM | POA: Diagnosis not present

## 2014-04-06 DIAGNOSIS — G4730 Sleep apnea, unspecified: Secondary | ICD-10-CM | POA: Diagnosis not present

## 2014-04-06 DIAGNOSIS — R0602 Shortness of breath: Secondary | ICD-10-CM | POA: Diagnosis not present

## 2014-04-06 DIAGNOSIS — H269 Unspecified cataract: Secondary | ICD-10-CM | POA: Diagnosis not present

## 2014-04-06 DIAGNOSIS — K219 Gastro-esophageal reflux disease without esophagitis: Secondary | ICD-10-CM | POA: Diagnosis not present

## 2014-04-06 DIAGNOSIS — I2510 Atherosclerotic heart disease of native coronary artery without angina pectoris: Secondary | ICD-10-CM | POA: Diagnosis not present

## 2014-04-06 DIAGNOSIS — H2511 Age-related nuclear cataract, right eye: Secondary | ICD-10-CM | POA: Diagnosis not present

## 2014-04-06 DIAGNOSIS — K449 Diaphragmatic hernia without obstruction or gangrene: Secondary | ICD-10-CM | POA: Diagnosis not present

## 2014-04-06 DIAGNOSIS — Z880 Allergy status to penicillin: Secondary | ICD-10-CM | POA: Diagnosis not present

## 2014-04-06 DIAGNOSIS — I499 Cardiac arrhythmia, unspecified: Secondary | ICD-10-CM | POA: Diagnosis not present

## 2014-04-06 DIAGNOSIS — Z7982 Long term (current) use of aspirin: Secondary | ICD-10-CM | POA: Diagnosis not present

## 2014-04-14 ENCOUNTER — Encounter: Payer: Self-pay | Admitting: Internal Medicine

## 2014-04-14 ENCOUNTER — Ambulatory Visit (INDEPENDENT_AMBULATORY_CARE_PROVIDER_SITE_OTHER): Payer: Medicare Other | Admitting: Internal Medicine

## 2014-04-14 ENCOUNTER — Ambulatory Visit (INDEPENDENT_AMBULATORY_CARE_PROVIDER_SITE_OTHER): Payer: Medicare Other | Admitting: *Deleted

## 2014-04-14 VITALS — BP 122/60 | HR 63 | Temp 98.4°F | Ht 60.5 in | Wt 172.0 lb

## 2014-04-14 DIAGNOSIS — E7800 Pure hypercholesterolemia, unspecified: Secondary | ICD-10-CM

## 2014-04-14 DIAGNOSIS — Z23 Encounter for immunization: Secondary | ICD-10-CM

## 2014-04-14 DIAGNOSIS — R0602 Shortness of breath: Secondary | ICD-10-CM

## 2014-04-14 DIAGNOSIS — R739 Hyperglycemia, unspecified: Secondary | ICD-10-CM

## 2014-04-14 DIAGNOSIS — F439 Reaction to severe stress, unspecified: Secondary | ICD-10-CM

## 2014-04-14 DIAGNOSIS — K219 Gastro-esophageal reflux disease without esophagitis: Secondary | ICD-10-CM | POA: Diagnosis not present

## 2014-04-14 DIAGNOSIS — E669 Obesity, unspecified: Secondary | ICD-10-CM

## 2014-04-14 DIAGNOSIS — I10 Essential (primary) hypertension: Secondary | ICD-10-CM

## 2014-04-14 DIAGNOSIS — Z658 Other specified problems related to psychosocial circumstances: Secondary | ICD-10-CM

## 2014-04-14 DIAGNOSIS — Z86010 Personal history of colonic polyps: Secondary | ICD-10-CM

## 2014-04-14 DIAGNOSIS — G4733 Obstructive sleep apnea (adult) (pediatric): Secondary | ICD-10-CM

## 2014-04-14 NOTE — Progress Notes (Signed)
Pre visit review using our clinic review tool, if applicable. No additional management support is needed unless otherwise documented below in the visit note. 

## 2014-04-19 ENCOUNTER — Encounter: Payer: Self-pay | Admitting: Internal Medicine

## 2014-04-19 DIAGNOSIS — Z6838 Body mass index (BMI) 38.0-38.9, adult: Secondary | ICD-10-CM | POA: Insufficient documentation

## 2014-04-19 NOTE — Progress Notes (Signed)
Subjective:    Patient ID: April [name], female    DOB: 1940/12/07, 73 y.o.   MRN: [id number]  HPI 73 year old female with past history of hypertension, hypercholesterolemia, sleep apnea and osteoporosis who comes in today for a scheduled follow up.   She states she is doing relatively well.  Increased stress dealing with her husbands recent death and her daughter's medical issues.  Does not feel she needs any further intervention at this time.  Feels she is doing some better.  Has good and bad days.  She sees Dr Jamal Collin for her breast.  Had follow up with him 2/15.  Sees him once a year. Everything checked out fine.   Had a colonoscopy 06/23/11.  Diverticulosis.  Had a heart cath in 2013 - Dr Saralyn Pilar.  This was done secondary to problems she was having with sweating - head to toe.  Would feel light headed.  Had a questionable stress test.  Cath ok.   No chest pain or tightness.  Breathing stable.  Eating and drinking well.  some acid reflux.  Takes something occasionally.  Bowels stable.  not taking nexium regularly.  She had an abnormal cxr and subsequently had a CT chest.  Has a large hiatal hernia.  Eating and drinking ok.  Blood pressure varying.  See her attached list.  Some elevation at times (0000000 systolic occasionally).       Past Medical History  Diagnosis Date  . Hypertension   . Hypercholesterolemia   . History of colon polyps 08/1994  . Osteoporosis   . Sleep apnea     CPAP  . Arrhythmia     history of an irregular heart beat  . Diverticulitis   . Urine incontinence   . Rheumatic fever   . Migraines     history of migraines  . Endometriosis     Current Outpatient Prescriptions on File Prior to Visit  Medication Sig Dispense Refill  . aspirin 81 MG tablet Take 81 mg by mouth daily.    . calcium carbonate (TUMS - DOSED IN MG ELEMENTAL CALCIUM) 500 MG chewable tablet Chew 1 tablet by mouth daily as needed.     . carvedilol (COREG) 25 MG tablet TAKE ONE TABLET TWICE DAILY  WITH A MEAL 60 tablet 5  . fish oil-omega-3 fatty acids 1000 MG capsule Take 1 g by mouth daily.    Marland Kitchen losartan-hydrochlorothiazide (HYZAAR) 100-12.5 MG per tablet TAKE ONE (1) TABLET EACH DAY 30 tablet 2  . Misc Natural Products (OSTEO BI-FLEX JOINT SHIELD PO) Take by mouth 2 (two) times daily.    . Multiple Vitamin (MULTIVITAMIN) tablet Take 1 tablet by mouth daily.    . pravastatin (PRAVACHOL) 20 MG tablet TAKE ONE (1) TABLET EACH DAY 30 tablet 2  . VITAMIN D, CHOLECALCIFEROL, PO Take 1,000 Units by mouth 2 (two) times daily.      No current facility-administered medications on file prior to visit.    Review of Systems Patient denies any headache.  No light headedness reported today.  No sinus or allergy symptoms currently.  No chest pain, tightness or palpitations reported.   No increased cough or congestion.  Feels her breathing is stable.  Some acid reflux as outlined.   No nausea or vomiting.  No abdominal pain or cramping.  No bowel change, such as diarrhea, constipation, BRBPR or melana.  No urine change.  Feels she is handling stress relatively well.  Blood pressure as outlined.  Objective:   Physical Exam  Filed Vitals:   04/14/14 1328  BP: 122/60  Pulse: 63  Temp: 98.4 F (36.9 C)   Blood pressure recheck:  66/59  73 year old female in no acute distress.   HEENT:  Nares- clear.  Oropharynx - without lesions. NECK:  Supple.  Nontender.  No audible bruit.  HEART:  Appears to be regular. LUNGS:  No crackles or wheezing audible.  Respirations even and unlabored.  RADIAL PULSE:  Equal bilaterally.   ABDOMEN:  Soft, nontender.  Bowel sounds present and normal.  No audible abdominal bruit.     EXTREMITIES:  No increased edema present.  DP pulses palpable and equal bilaterally.          Assessment & Plan:  1. Obesity (BMI 30-39.9) Diet and exercise.    2. Essential hypertension, benign Blood pressure looks good on checks today.  Some variation with her readings.   Overall appears to be under reasonable control.  Same medication regimen.  Follow.   - Basic metabolic panel; Future  3. Obstructive sleep apnea Continue CPAP.   4. Gastroesophageal reflux disease without esophagitis Some reflux.  Restart nexium.  Follow.    5. Hypercholesterolemia Low cholesterol diet and exercise.  Continue pravastatin.  - Lipid panel; Future - Hepatic function panel; Future Lab Results  Component Value Date   CHOL 190 12/03/2013   HDL 50.30 12/03/2013   LDLCALC 120* 12/03/2013   TRIG 101.0 12/03/2013   CHOLHDL 4 12/03/2013   6. Stress Feels she is doing relatively well.  Follow.    7. History of colonic polyps Colonoscopy 06/23/11.   8. SOB (shortness of breath) Stable.  W/up as outlined.    9. Hyperglycemia Low carb diet and exercise.   - Hemoglobin A1c; Future  HEALTH MAINTENANCE.  Physical 10/21/13.   Colonoscopy 06/23/11.  Pneumovax 06/12/05, Tdap 3/12 and Zostavax 2008.  Mammogram 06/25/13.  Followed by Dr Jamal Collin.    I spent 25 minutes with the patient and more than 50% of the time was spent in consultation regarding the above.

## 2014-04-28 ENCOUNTER — Other Ambulatory Visit (INDEPENDENT_AMBULATORY_CARE_PROVIDER_SITE_OTHER): Payer: Medicare Other

## 2014-04-28 DIAGNOSIS — E7800 Pure hypercholesterolemia, unspecified: Secondary | ICD-10-CM

## 2014-04-28 DIAGNOSIS — I10 Essential (primary) hypertension: Secondary | ICD-10-CM | POA: Diagnosis not present

## 2014-04-28 DIAGNOSIS — R739 Hyperglycemia, unspecified: Secondary | ICD-10-CM

## 2014-04-28 LAB — LIPID PANEL
Cholesterol: 200 mg/dL (ref 0–200)
HDL: 56.3 mg/dL (ref >39.00)
LDL Cholesterol: 128 mg/dL — ABNORMAL HIGH (ref 0–99)
NONHDL: 143.7
Total CHOL/HDL Ratio: 4
Triglycerides: 78 mg/dL (ref 0.0–149.0)
VLDL: 15.6 mg/dL (ref 0.0–40.0)

## 2014-04-28 LAB — BASIC METABOLIC PANEL
BUN: 20 mg/dL (ref 6–23)
CALCIUM: 9.4 mg/dL (ref 8.4–10.5)
CO2: 31 meq/L (ref 19–32)
CREATININE: 0.8 mg/dL (ref 0.4–1.2)
Chloride: 108 mEq/L (ref 96–112)
GFR: 75.3 mL/min (ref >60.00)
Glucose, Bld: 100 mg/dL — ABNORMAL HIGH (ref 70–99)
POTASSIUM: 4.8 meq/L (ref 3.5–5.1)
Sodium: 146 mEq/L — ABNORMAL HIGH (ref 135–145)

## 2014-04-28 LAB — HEPATIC FUNCTION PANEL
ALT: 16 U/L (ref 0–35)
AST: 26 U/L (ref 0–37)
Albumin: 3.5 g/dL (ref 3.5–5.2)
Alkaline Phosphatase: 57 U/L (ref 39–117)
Bilirubin, Direct: 0.1 mg/dL (ref 0.0–0.3)
TOTAL PROTEIN: 6.3 g/dL (ref 6.0–8.3)
Total Bilirubin: 0.6 mg/dL (ref 0.2–1.2)

## 2014-04-28 LAB — HEMOGLOBIN A1C: HEMOGLOBIN A1C: 6.6 % — AB (ref 4.6–6.5)

## 2014-04-29 ENCOUNTER — Encounter: Payer: Self-pay | Admitting: *Deleted

## 2014-05-26 ENCOUNTER — Other Ambulatory Visit: Payer: Self-pay | Admitting: Internal Medicine

## 2014-06-24 ENCOUNTER — Ambulatory Visit: Payer: Medicare Other | Admitting: General Surgery

## 2014-06-29 ENCOUNTER — Ambulatory Visit: Payer: Self-pay | Admitting: General Surgery

## 2014-06-29 ENCOUNTER — Encounter: Payer: Self-pay | Admitting: General Surgery

## 2014-06-29 DIAGNOSIS — Z1231 Encounter for screening mammogram for malignant neoplasm of breast: Secondary | ICD-10-CM | POA: Diagnosis not present

## 2014-07-02 ENCOUNTER — Ambulatory Visit (INDEPENDENT_AMBULATORY_CARE_PROVIDER_SITE_OTHER): Payer: Medicare Other | Admitting: Internal Medicine

## 2014-07-02 ENCOUNTER — Encounter: Payer: Self-pay | Admitting: Internal Medicine

## 2014-07-02 VITALS — BP 138/68 | HR 79 | Temp 99.1°F | Ht 60.5 in | Wt 178.1 lb

## 2014-07-02 DIAGNOSIS — Z658 Other specified problems related to psychosocial circumstances: Secondary | ICD-10-CM

## 2014-07-02 DIAGNOSIS — I10 Essential (primary) hypertension: Secondary | ICD-10-CM | POA: Diagnosis not present

## 2014-07-02 DIAGNOSIS — R0602 Shortness of breath: Secondary | ICD-10-CM

## 2014-07-02 DIAGNOSIS — E7800 Pure hypercholesterolemia, unspecified: Secondary | ICD-10-CM

## 2014-07-02 DIAGNOSIS — F439 Reaction to severe stress, unspecified: Secondary | ICD-10-CM

## 2014-07-02 DIAGNOSIS — Z86010 Personal history of colonic polyps: Secondary | ICD-10-CM

## 2014-07-02 DIAGNOSIS — R6889 Other general symptoms and signs: Secondary | ICD-10-CM | POA: Diagnosis not present

## 2014-07-02 DIAGNOSIS — K219 Gastro-esophageal reflux disease without esophagitis: Secondary | ICD-10-CM | POA: Diagnosis not present

## 2014-07-02 DIAGNOSIS — R739 Hyperglycemia, unspecified: Secondary | ICD-10-CM

## 2014-07-02 DIAGNOSIS — G4733 Obstructive sleep apnea (adult) (pediatric): Secondary | ICD-10-CM | POA: Diagnosis not present

## 2014-07-02 DIAGNOSIS — Z0000 Encounter for general adult medical examination without abnormal findings: Secondary | ICD-10-CM | POA: Diagnosis not present

## 2014-07-02 NOTE — Progress Notes (Signed)
Pre visit review using our clinic review tool, if applicable. No additional management support is needed unless otherwise documented below in the visit note. 

## 2014-07-02 NOTE — Patient Instructions (Signed)
Robitussin twice a day as needed.

## 2014-07-06 ENCOUNTER — Encounter: Payer: Self-pay | Admitting: General Surgery

## 2014-07-06 ENCOUNTER — Ambulatory Visit (INDEPENDENT_AMBULATORY_CARE_PROVIDER_SITE_OTHER): Payer: Medicare Other | Admitting: General Surgery

## 2014-07-06 ENCOUNTER — Encounter: Payer: Self-pay | Admitting: Internal Medicine

## 2014-07-06 VITALS — BP 118/70 | HR 64 | Resp 14 | Ht 61.0 in | Wt 176.0 lb

## 2014-07-06 DIAGNOSIS — N6019 Diffuse cystic mastopathy of unspecified breast: Secondary | ICD-10-CM | POA: Diagnosis not present

## 2014-07-06 DIAGNOSIS — Z86010 Personal history of colonic polyps: Secondary | ICD-10-CM

## 2014-07-06 DIAGNOSIS — Z0000 Encounter for general adult medical examination without abnormal findings: Secondary | ICD-10-CM | POA: Insufficient documentation

## 2014-07-06 DIAGNOSIS — R6889 Other general symptoms and signs: Secondary | ICD-10-CM | POA: Insufficient documentation

## 2014-07-06 NOTE — Patient Instructions (Signed)
Patient to return in 1 year with bilateral screening mammogram. Continue self breast exams. Call office for any new breast issues or concerns.  

## 2014-07-06 NOTE — Assessment & Plan Note (Signed)
Colonoscopy 06/23/11 revealed diverticulosis otherwise normal.  Recommended f/u colonoscopy in five years.

## 2014-07-06 NOTE — Assessment & Plan Note (Signed)
Feels she is handling stress relatively well.  Follow.

## 2014-07-06 NOTE — Assessment & Plan Note (Signed)
Continue with CPAP

## 2014-07-06 NOTE — Progress Notes (Signed)
Patient ID: April [name], female   DOB: March 02, 1941, 74 y.o.   MRN: [id number]  Chief Complaint  Patient presents with  . Follow-up    mammogram     HPI April [name] is a 74 y.o. female who presents for a breast evaluation. The most recent mammogram was done on 06/29/14. Patient does perform regular self breast checks and gets regular mammograms done. The patient denies any new problems with the breasts at this time.    HPI  Past Medical History  Diagnosis Date  . Hypertension   . Hypercholesterolemia   . History of colon polyps 08/1994  . Osteoporosis   . Sleep apnea     CPAP  . Arrhythmia     history of an irregular heart beat  . Diverticulitis   . Urine incontinence   . Rheumatic fever   . Migraines     history of migraines  . Endometriosis     Past Surgical History  Procedure Laterality Date  . Abdominal hysterectomy      ovaries not removed  . Tonsillectomy    . Breast biopsies      x2  . Appendectomy      was removed during hysterectomy  . Breast surgery Bilateral   . Coronary angioplasty  2012  . Colonoscopy  2013    Family History  Problem Relation Age of Onset  . Arthritis Mother   . Stroke Mother   . Hypertension Mother   . Heart disease Father     MI  . Stroke Brother   . Osteoporosis Mother   . Breast cancer      first cousin x 2  . Colon cancer Neg Hx     Social History History  Substance Use Topics  . Smoking status: Never Smoker   . Smokeless tobacco: Never Used  . Alcohol Use: No    Allergies  Allergen Reactions  . Penicillins     Okay to take amoxicillin   . Sulfa Antibiotics     Current Outpatient Prescriptions  Medication Sig Dispense Refill  . aspirin 81 MG tablet Take 81 mg by mouth daily.    . calcium carbonate (TUMS - DOSED IN MG ELEMENTAL CALCIUM) 500 MG chewable tablet Chew 1 tablet by mouth daily as needed.     . carvedilol (COREG) 25 MG tablet TAKE ONE TABLET TWICE DAILY WITH A MEAL 60 tablet 5  .  Dextromethorphan-Guaifenesin (ROBITUSSIN DM PO) Take by mouth 2 (two) times daily.    . DUREZOL 0.05 % EMUL   0  . fish oil-omega-3 fatty acids 1000 MG capsule Take 1 g by mouth daily.    . fluticasone (FLONASE) 50 MCG/ACT nasal spray Place 2 sprays into both nostrils daily.    . ILEVRO 0.3 % SUSP   0  . losartan-hydrochlorothiazide (HYZAAR) 100-12.5 MG per tablet TAKE ONE (1) TABLET EACH DAY 30 tablet 6  . Misc Natural Products (OSTEO BI-FLEX JOINT SHIELD PO) Take by mouth 2 (two) times daily.    . Multiple Vitamin (MULTIVITAMIN) tablet Take 1 tablet by mouth daily.    . pravastatin (PRAVACHOL) 20 MG tablet TAKE ONE (1) TABLET EACH DAY 30 tablet 6  . VITAMIN D, CHOLECALCIFEROL, PO Take 1,000 Units by mouth 2 (two) times daily.      No current facility-administered medications for this visit.    Review of Systems Review of Systems  Constitutional: Negative.   Respiratory: Negative.   Cardiovascular: Negative.     Blood  pressure 118/70, pulse 64, resp. rate 14, height 5\' 1"  (1.549 m), weight 176 lb (79.833 kg), last menstrual period 05/01/1972.  Physical Exam Physical Exam  Constitutional: She is oriented to person, place, and time. She appears well-developed and well-nourished.  Eyes: Conjunctivae are normal. No scleral icterus.  Neck: Neck supple. No thyromegaly present.  Cardiovascular: Normal rate and normal heart sounds.   No murmur heard. Pulmonary/Chest: Effort normal and breath sounds normal. Right breast exhibits no inverted nipple, no mass, no nipple discharge, no skin change and no tenderness. Left breast exhibits no inverted nipple, no mass, no nipple discharge, no skin change and no tenderness.  Lymphadenopathy:    She has no cervical adenopathy.    She has no axillary adenopathy.  Neurological: She is alert and oriented to person, place, and time.  Skin: Skin is warm and dry.  3 mm raised nodular lesion in the right hand at the base of the second finger. It occurred  after she had pinched the skin after opening a bottle.     Data Reviewed Mammogram reviewed and stable.  Assessment    Stable exam. History of FCD. History of colon polyp. Right hand lesion looks benign- can be removed if it causes symptoms or enlargers.     Plan    Return in 1 yr with bilateral screening mammogram. Colonoscopy due in 2018.        Stephenia Vogan G 07/06/2014, 1:01 PM

## 2014-07-06 NOTE — Progress Notes (Signed)
Patient ID: April [name], female   DOB: 30-May-1940, 74 y.o.   MRN: [id number]   Subjective:    Patient ID: April [name], female    DOB: [birth year], 74 y.o.   MRN: [id number]  HPI  Patient here for a scheduled follow up.  States that she was out of town (two weeks ago) taking care of her relative.  Noticed increased heart rate and elevated blood pressure.  States the following day - better.  Has not had any further episodes since.  Still report some sob.  Had recent cardiac w/up.  With some throat congestion.  Taking sudafed previously.  SOB persist.  She has adjusted her diet.  Not taking nexium regularly now.  Bowels stable.     Past Medical History  Diagnosis Date  . Hypertension   . Hypercholesterolemia   . History of colon polyps 08/1994  . Osteoporosis   . Sleep apnea     CPAP  . Arrhythmia     history of an irregular heart beat  . Diverticulitis   . Urine incontinence   . Rheumatic fever   . Migraines     history of migraines  . Endometriosis     Current Outpatient Prescriptions on File Prior to Visit  Medication Sig Dispense Refill  . aspirin 81 MG tablet Take 81 mg by mouth daily.    . calcium carbonate (TUMS - DOSED IN MG ELEMENTAL CALCIUM) 500 MG chewable tablet Chew 1 tablet by mouth daily as needed.     . carvedilol (COREG) 25 MG tablet TAKE ONE TABLET TWICE DAILY WITH A MEAL 60 tablet 5  . DUREZOL 0.05 % EMUL   0  . fish oil-omega-3 fatty acids 1000 MG capsule Take 1 g by mouth daily.    . fluticasone (FLONASE) 50 MCG/ACT nasal spray Place 2 sprays into both nostrils daily.    . ILEVRO 0.3 % SUSP   0  . losartan-hydrochlorothiazide (HYZAAR) 100-12.5 MG per tablet TAKE ONE (1) TABLET EACH DAY 30 tablet 6  . Misc Natural Products (OSTEO BI-FLEX JOINT SHIELD PO) Take by mouth 2 (two) times daily.    . Multiple Vitamin (MULTIVITAMIN) tablet Take 1 tablet by mouth daily.    . pravastatin (PRAVACHOL) 20 MG tablet TAKE ONE (1) TABLET EACH DAY 30 tablet 6  . VITAMIN  D, CHOLECALCIFEROL, PO Take 1,000 Units by mouth 2 (two) times daily.      No current facility-administered medications on file prior to visit.    Review of Systems  Constitutional: Negative for appetite change and unexpected weight change.  HENT: Positive for congestion (throat congestion). Negative for sinus pressure.   Respiratory: Positive for shortness of breath (still with sob.  ). Negative for cough and chest tightness.   Cardiovascular: Negative for chest pain and leg swelling.       Had the episode of increased heart rate and elevated blood pressure.    Gastrointestinal: Negative for nausea, vomiting, abdominal pain and diarrhea.  Genitourinary: Negative for dysuria and difficulty urinating.  Musculoskeletal: Negative for back pain and joint swelling.  Skin: Negative for color change and rash.  Neurological: Negative for light-headedness and headaches.       Objective:    Physical Exam  Constitutional: She is oriented to person, place, and time. She appears well-developed and well-nourished. No distress.  HENT:  Nose: Nose normal.  Mouth/Throat: Oropharynx is clear and moist.  Neck: Neck supple. No thyromegaly present.  Cardiovascular: Normal rate and regular rhythm.  Pulmonary/Chest: Breath sounds normal. No respiratory distress. She has no wheezes.  Abdominal: Soft. Bowel sounds are normal. There is no tenderness.  Musculoskeletal: She exhibits no edema or tenderness.  Lymphadenopathy:    She has no cervical adenopathy.  Neurological: She is alert and oriented to person, place, and time.  Skin: No rash noted. No erythema.    BP 138/68 mmHg  Pulse 79  Temp(Src) 99.1 F (37.3 C) (Oral)  Ht 5' 0.5" (1.537 m)  Wt 178 lb 2 oz (80.797 kg)  BMI 34.20 kg/m2  SpO2 96%  LMP 05/01/1972 Wt Readings from Last 3 Encounters:  07/02/14 178 lb 2 oz (80.797 kg)  04/14/14 172 lb (78.019 kg)  12/09/13 163 lb 8 oz (74.163 kg)     Lab Results  Component Value Date   WBC  6.1 12/03/2013   HGB 12.7 12/03/2013   HCT 37.9 12/03/2013   PLT 225.0 12/03/2013   GLUCOSE 100* 04/28/2014   CHOL 200 04/28/2014   TRIG 78.0 04/28/2014   HDL 56.30 04/28/2014   LDLCALC 128* 04/28/2014   ALT 16 04/28/2014   AST 26 04/28/2014   NA 146* 04/28/2014   K 4.8 04/28/2014   CL 108 04/28/2014   CREATININE 0.8 04/28/2014   BUN 20 04/28/2014   CO2 31 04/28/2014   TSH 1.40 12/03/2013   HGBA1C 6.6* 04/28/2014       Assessment & Plan:   Problem List Items Addressed This Visit    Congestion of throat    Robitussin as directed.  Restart nexium on a regular basis.  Follow.        Essential hypertension, benign - Primary    Blood pressure here improved.  Continue same medication regimen.  Follow met b.  Avoid sudafed.  Had the episode of increased heart rate and elevated blood pressure.  Unsure of the etiology.  Has not reoccurred.  Avoid sudafed.  Follow.        Relevant Orders   Basic metabolic panel   GERD (gastroesophageal reflux disease)    Continue nexium regularly.  Follow.        Health care maintenance    Physical 10/21/13.  Colonoscopy 06/13/11.  Mammogram 06/29/14 - Birads I.        History of colonic polyps    Colonoscopy 06/23/11 revealed diverticulosis otherwise normal.  Recommended f/u colonoscopy in five years.        Hypercholesterolemia    On pravastatin.  Follow lipid panel and liver function tests.         Relevant Orders   Hepatic function panel   Lipid panel   Obstructive sleep apnea    Continue with CPAP.       SOB (shortness of breath)    Had had cardiac w/up previously.  CXR and CT chest as outlined in previous note.  ECHO revealed EF 65% with grade I diastolic dysfunction.  PAP at the upper limits of normal.  Continue CPAP.  With the persistent sob, refer to pulmonary for further evaluation and treatment recommendations.        Stress    Feels she is handling stress relatively well.  Follow.         Other Visit Diagnoses     Hyperglycemia        Relevant Orders    Hemoglobin A1c        Einar Pheasant, MD

## 2014-07-06 NOTE — Assessment & Plan Note (Signed)
On pravastatin.  Follow lipid panel and liver function tests.   

## 2014-07-06 NOTE — Assessment & Plan Note (Signed)
Physical 10/21/13.  Colonoscopy 06/13/11.  Mammogram 06/29/14 - Birads I.

## 2014-07-06 NOTE — Assessment & Plan Note (Signed)
Continue nexium regularly.  Follow.

## 2014-07-06 NOTE — Assessment & Plan Note (Addendum)
Blood pressure here improved.  Continue same medication regimen.  Follow met b.  Avoid sudafed.  Had the episode of increased heart rate and elevated blood pressure.  Unsure of the etiology.  Has not reoccurred.  Avoid sudafed.  Follow.

## 2014-07-06 NOTE — Assessment & Plan Note (Signed)
Had had cardiac w/up previously.  CXR and CT chest as outlined in previous note.  ECHO revealed EF 65% with grade I diastolic dysfunction.  PAP at the upper limits of normal.  Continue CPAP.  With the persistent sob, refer to pulmonary for further evaluation and treatment recommendations.

## 2014-07-06 NOTE — Assessment & Plan Note (Signed)
Robitussin as directed.  Restart nexium on a regular basis.  Follow.

## 2014-07-15 ENCOUNTER — Encounter: Payer: Self-pay | Admitting: Internal Medicine

## 2014-07-15 ENCOUNTER — Ambulatory Visit (INDEPENDENT_AMBULATORY_CARE_PROVIDER_SITE_OTHER): Payer: Medicare Other | Admitting: Internal Medicine

## 2014-07-15 VITALS — BP 136/66 | HR 86 | Temp 98.0°F | Ht 60.5 in | Wt 175.0 lb

## 2014-07-15 DIAGNOSIS — R0602 Shortness of breath: Secondary | ICD-10-CM | POA: Diagnosis not present

## 2014-07-15 DIAGNOSIS — R0609 Other forms of dyspnea: Secondary | ICD-10-CM

## 2014-07-15 DIAGNOSIS — R0600 Dyspnea, unspecified: Secondary | ICD-10-CM | POA: Insufficient documentation

## 2014-07-15 DIAGNOSIS — G4733 Obstructive sleep apnea (adult) (pediatric): Secondary | ICD-10-CM

## 2014-07-15 NOTE — Progress Notes (Signed)
Date: 07/15/2014  MRN# [id number] HYDEIA OSHITA 1940/12/22  Referring Physician:   CHERRISE SHEDLOCK is a 74 y.o. old female seen in consultation for shortness of breath  CC:  Chief Complaint  Patient presents with  . Advice Only    Pt referred for longstanding sob. She says it has gotten worse but can't pinpoint how long. Pt does have sl. cough at times, wheezing and chest tightness. Pt denies mucus. Pt wears CPAP most nights 6-8 hrs.     HPI:  Patient is a pleasant 74 year old female presents today for further evaluation of shortness of breath is becoming worse over the past one year. Patient states that she also has dyspnea on exertion, walking from the parking lot to the office today elicited dyspnea, she also states that she cannot climb 1 flight of stairs. Over the last year for shortness of breath has been gradual in nature, she describes the sensation as "winded" and being fatigued. She has a history of a hiatal hernia. Patient was also seen by cardiology, Dr. Candis Musa, workup included cardiac catheterization, which was read as normal. Patient says she has intermittent cough at times which is nonproductive. She cannot identify any factors and the last year that may have caused worsening shortness of breath or any relation to allergies or environmental changes. She has a history of obstructive sleep apnea for which she wears BiPAP nightly since 2012; however over the past 2-3 weeks she has not worn her BiPAP due to having nasal congestion and suspected sinusitis. Patient is a never smoker, but does endorse exposure to secondhand smoke when she lived at home up until the age of 74 years old.  PMHX:   Past Medical History  Diagnosis Date  . Hypertension   . Hypercholesterolemia   . History of colon polyps 08/1994  . Osteoporosis   . Sleep apnea     CPAP  . Arrhythmia     history of an irregular heart beat  . Diverticulitis   . Urine incontinence   . Rheumatic fever   . Migraines      history of migraines  . Endometriosis    Surgical Hx:  Past Surgical History  Procedure Laterality Date  . Abdominal hysterectomy      ovaries not removed  . Tonsillectomy    . Breast biopsies      x2  . Appendectomy      was removed during hysterectomy  . Breast surgery Bilateral   . Coronary angioplasty  2012  . Colonoscopy  2013   Family Hx:  Family History  Problem Relation Age of Onset  . Arthritis Mother   . Stroke Mother   . Hypertension Mother   . Heart disease Father     MI  . Stroke Brother   . Osteoporosis Mother   . Breast cancer      first cousin x 2  . Colon cancer Neg Hx    Social Hx:   History  Substance Use Topics  . Smoking status: Never Smoker   . Smokeless tobacco: Never Used  . Alcohol Use: No   Medication:   Current Outpatient Rx  Name  Route  Sig  Dispense  Refill  . aspirin 81 MG tablet   Oral   Take 81 mg by mouth daily.         . calcium carbonate (TUMS - DOSED IN MG ELEMENTAL CALCIUM) 500 MG chewable tablet   Oral   Chew 1 tablet by mouth daily  as needed.          . carvedilol (COREG) 25 MG tablet      TAKE ONE TABLET TWICE DAILY WITH A MEAL   60 tablet   5   . Dextromethorphan-Guaifenesin (ROBITUSSIN DM PO)   Oral   Take by mouth 2 (two) times daily.         . fish oil-omega-3 fatty acids 1000 MG capsule   Oral   Take 1 g by mouth daily.         . fluticasone (FLONASE) 50 MCG/ACT nasal spray   Each Nare   Place 2 sprays into both nostrils daily.         . Misc Natural Products (OSTEO BI-FLEX JOINT SHIELD PO)   Oral   Take by mouth 2 (two) times daily.         . Multiple Vitamin (MULTIVITAMIN) tablet   Oral   Take 1 tablet by mouth daily.         . pravastatin (PRAVACHOL) 20 MG tablet      TAKE ONE (1) TABLET EACH DAY   30 tablet   6   . VITAMIN D, CHOLECALCIFEROL, PO   Oral   Take 1,000 Units by mouth 2 (two) times daily.          Marland Kitchen losartan-hydrochlorothiazide (HYZAAR) 100-12.5 MG per  tablet      TAKE ONE (1) TABLET EACH DAY Patient not taking: Reported on 07/15/2014   30 tablet   6       Allergies:  Penicillins and Sulfa antibiotics  Review of Systems: Gen:  Denies  fever, sweats, chills HEENT: Denies blurred vision, double vision, ear pain, eye pain, hearing loss, nose bleeds, sore throat Cvc:  No dizziness, chest pain or heaviness Resp:   Admits to shortness of breath, dyspnea on exertion Gi: Denies swallowing difficulty, stomach pain, nausea or vomiting, diarrhea, constipation, bowel incontinence Gu:  Denies bladder incontinence, burning urine Ext:   No Joint pain, stiffness or swelling Skin: No skin rash, easy bruising or bleeding or hives Endoc:  No polyuria, polydipsia , polyphagia or weight change Psych: No depression, insomnia or hallucinations  Other:  All other systems negative  Physical Examination:   VS: BP 136/66 mmHg  Pulse 86  Temp(Src) 98 F (36.7 C) (Oral)  Ht 5' 0.5" (1.537 m)  Wt 175 lb (79.379 kg)  BMI 33.60 kg/m2  SpO2 95%  LMP 05/01/1972  General Appearance: No distress  Neuro:without focal findings, mental status, speech normal, alert and oriented, cranial nerves 2-12 intact, reflexes normal and symmetric, sensation grossly normal  HEENT: PERRLA, EOM intact, no ptosis, no other lesions noticed; Mallampati 2 Pulmonary: normal breath sounds., diaphragmatic excursion normal.No wheezing, No rales;   Sputum Production:  none CardiovascularNormal S1,S2.  No m/r/g.  Abdominal aorta pulsation normal.    Abdomen: Benign, Soft, non-tender, No masses, hepatosplenomegaly, No lymphadenopathy Renal:  No costovertebral tenderness  GU:  No performed at this time. Endoc: No evident thyromegaly, no signs of acromegaly or Cushing features Skin:   warm, no rashes, no ecchymosis. Right palm base with 5th digit small hematoma (from recent injury).  Extremities: normal, no cyanosis, clubbing, warm with normal capillary refill. Other findings:LE 1+  edema (R>L)   Labs results:   Rad results: (The following images and results were reviewed by Dr. Stevenson Clinch). CT Chest 6\2014  PROCEDURE: KCT - KCT CHEST WITH CONTRAST  - Oct 21 2012  1:22PM   RESULT: Axial CT scanning was  performed through the chest with  reconstructions at 3 mm intervals and slice thicknesses. The patient  received 75 cc of Isovue-370.  The cardiac chambers are normal in size. The caliber of the thoracic  aorta is normal. There is no evidence of a false lumen. Contrast within  the central and peripheral portions of the pulmonary arterial tree  appears normal where visualized. There are no bulky mediastinal or hilar  lymph nodes. There is a large hiatal hernia/partially intrathoracic  stomach. There is no pleural nor pericardial effusion. At lung window settings there is no interstitial nor alveolar infiltrate.  There is no pneumothorax or pneumomediastinum. There is no  bronchiectasis. The thoracic vertebral bodies are preserved in height.  The observed portions of the ribs exhibit no acute abnormalities.  Within the upper abdomen the observed portions of the liver and spleen  and adrenal glands appear normal.  IMPRESSION:  1. There is no evidence of pneumonia nor CHF. 2. There is a large hiatal hernia gas partially intrathoracic stomach. 3. There is no mediastinal nor hilar lymphadenopathy. 4. There is no pleural nor pericardial effusion.    ECHO 11/21/2013 - Left ventricle: The cavity size was normal. Systolic function was normal. The estimated ejection fraction was in the range of 55% to 65%. Wall motion was normal; there were no regional wall motion abnormalities. Doppler parameters are consistent with abnormal left ventricular relaxation (grade 1 diastolic dysfunction). - Mitral valve: There was mild regurgitation. - Left atrium: The atrium was normal in size. - Right ventricle: Systolic function was normal. - Pulmonary arteries: Systolic  pressure was within the high normal range. PA peak pressure: 32 mm Hg (S).  Cardiac catheterization 02/09/2010, by Dr. Isaias Cowman -CARDIAC STRUCTURES: EF calculated by contrast ventriculography was 55%.  -Summary: Insignificant CAD. CORONARY CIRCULATION: The coronary circulation is right dominant. Proximal RCA: There was a tubular 40 % stenosis.    Assessment and Plan:74 year old female with past medical history of obstructive sleep apnea,retention, coronary disease, seen today for evaluation of dyspnea.  DOE (dyspnea on exertion) I believe that her dyspnea on exertion is multifactorial in nature due to factors such as: Deconditioning, obstructive sleep apnea, obesity, hiatal hernia  Given that she's not been 100% compliant with her non-invasive positive pressure ventilation I suspect that her obstructive sleep apnea is not adequately treated, therefore will order a split night study to recheck her AHI and for optimal pressure checking.  Plan as follows: - pulmonary function testing and 6 minute walk test prior to follow up - split night study for Bipap titration prior to next visit    SOB (shortness of breath) See plan for dyspnea on exertion.   Obstructive sleep apnea Diagnosed with obstructive sleep apnea in 2002, has had significant weight changes since then, patient is not sure of her current BiPAP settings. I am not completely convinced that she is 100% compliant with her BiPAP, additionally given her weight fluctuations over the last 10-15 years I'm not sure that her current settings is providing the optimal pressure for her. Will further evaluate with a split night study.     Updated Medication List Outpatient Encounter Prescriptions as of 07/15/2014  Medication Sig  . aspirin 81 MG tablet Take 81 mg by mouth daily.  . calcium carbonate (TUMS - DOSED IN MG ELEMENTAL CALCIUM) 500 MG chewable tablet Chew 1 tablet by mouth daily as needed.   . carvedilol (COREG) 25  MG tablet TAKE ONE TABLET TWICE DAILY WITH A MEAL  .  Dextromethorphan-Guaifenesin (ROBITUSSIN DM PO) Take by mouth 2 (two) times daily.  . fish oil-omega-3 fatty acids 1000 MG capsule Take 1 g by mouth daily.  . fluticasone (FLONASE) 50 MCG/ACT nasal spray Place 2 sprays into both nostrils daily.  . Misc Natural Products (OSTEO BI-FLEX JOINT SHIELD PO) Take by mouth 2 (two) times daily.  . Multiple Vitamin (MULTIVITAMIN) tablet Take 1 tablet by mouth daily.  . pravastatin (PRAVACHOL) 20 MG tablet TAKE ONE (1) TABLET EACH DAY  . VITAMIN D, CHOLECALCIFEROL, PO Take 1,000 Units by mouth 2 (two) times daily.   Marland Kitchen losartan-hydrochlorothiazide (HYZAAR) 100-12.5 MG per tablet TAKE ONE (1) TABLET EACH DAY (Patient not taking: Reported on 07/15/2014)  . [DISCONTINUED] DUREZOL 0.05 % EMUL   . [DISCONTINUED] ILEVRO 0.3 % SUSP     Orders for this visit: Orders Placed This Encounter  Procedures  . Pulmonary function test    Standing Status: Future     Number of Occurrences:      Standing Expiration Date: 07/15/2015    Order Specific Question:  Where should this test be performed?    Answer:  [facility]    Order Specific Question:  Full PFT: includes the following: basic spirometry, spirometry pre & post bronchodilator, diffusion capacity (DLCO), lung volumes    Answer:  Full PFT    Order Specific Question:  MIP/MEP    Answer:  No    Order Specific Question:  6 minute walk    Answer:  Yes    Order Specific Question:  ABG    Answer:  No    Order Specific Question:  Diffusion capacity (DLCO)    Answer:  No    Order Specific Question:  Lung volumes    Answer:  No    Order Specific Question:  Methacholine challenge    Answer:  No  . Cpap titration    Standing Status: Future     Number of Occurrences:      Standing Expiration Date: 07/15/2015    Scheduling Instructions:     cpap titration transition to bipap    Order Specific Question:  Where should this test be performed:    Answer:   [facility]     Thank  you for the consultation and for allowing [facility] to assist in the care of your patient. Our recommendations are noted above.  Please contact us if we can be of further service.   Vilinda Boehringer, MD [facility] Office Number: [phone]

## 2014-07-15 NOTE — Patient Instructions (Signed)
Follow up with Dr. Stevenson Clinch in 1 month - pulmonary function testing and 6 minute walk test prior to follow up - split night study for Bipap titration prior to next visit - please call our office when you are done with the sleep test

## 2014-07-20 NOTE — Assessment & Plan Note (Signed)
Diagnosed with obstructive sleep apnea in 2002, has had significant weight changes since then, patient is not sure of her current BiPAP settings. I am not completely convinced that she is 100% compliant with her BiPAP, additionally given her weight fluctuations over the last 10-15 years I'm not sure that her current settings is providing the optimal pressure for her. Will further evaluate with a split night study.

## 2014-07-20 NOTE — Assessment & Plan Note (Signed)
See plan for dyspnea on exertion 

## 2014-07-20 NOTE — Assessment & Plan Note (Addendum)
I believe that her dyspnea on exertion is multifactorial in nature due to factors such as: Deconditioning, obstructive sleep apnea, obesity, hiatal hernia  Given that she's not been 100% compliant with her non-invasive positive pressure ventilation I suspect that her obstructive sleep apnea is not adequately treated, therefore will order a split night study to recheck her AHI and for optimal pressure checking.  Plan as follows: - pulmonary function testing and 6 minute walk test prior to follow up - split night study for Bipap titration prior to next visit

## 2014-08-07 ENCOUNTER — Telehealth: Payer: Self-pay | Admitting: *Deleted

## 2014-08-07 NOTE — Telephone Encounter (Signed)
-----   Message from Catha Gosselin sent at 08/07/2014 10:48 AM EDT ----- Regarding: Sleep Study scheduled for 08/13/14 at [place]: Ms. Debari called and wanted to let you know when her sleep study was scheduled for 08/13/14.  She stated that she wanted to see Dr. Stevenson Clinch as soon after study as possible b/c they will be leaving on 09/10/14.  Can you please call her and schedule appointment?  Thanks, Suanne Marker

## 2014-08-07 NOTE — Telephone Encounter (Signed)
Called patient to schedule pft/smw and ov with Dr. Stevenson Clinch before 09/10/14. Pt already has smw on schedule 08/20/14 which can be cancelled.

## 2014-08-12 ENCOUNTER — Other Ambulatory Visit: Payer: Self-pay | Admitting: *Deleted

## 2014-08-12 DIAGNOSIS — R0602 Shortness of breath: Secondary | ICD-10-CM

## 2014-08-12 DIAGNOSIS — R0609 Other forms of dyspnea: Secondary | ICD-10-CM

## 2014-08-12 DIAGNOSIS — G4733 Obstructive sleep apnea (adult) (pediatric): Secondary | ICD-10-CM

## 2014-08-12 DIAGNOSIS — R0600 Dyspnea, unspecified: Secondary | ICD-10-CM

## 2014-08-13 ENCOUNTER — Ambulatory Visit: Admit: 2014-08-13 | Disposition: A | Discharge status: Home / Self Care | Payer: Self-pay | Admitting: Internal Medicine

## 2014-08-13 DIAGNOSIS — G4733 Obstructive sleep apnea (adult) (pediatric): Secondary | ICD-10-CM | POA: Diagnosis not present

## 2014-08-13 DIAGNOSIS — G4730 Sleep apnea, unspecified: Secondary | ICD-10-CM | POA: Diagnosis not present

## 2014-08-19 ENCOUNTER — Other Ambulatory Visit: Payer: Self-pay | Admitting: Internal Medicine

## 2014-08-19 ENCOUNTER — Ambulatory Visit (INDEPENDENT_AMBULATORY_CARE_PROVIDER_SITE_OTHER): Payer: Medicare Other | Admitting: Internal Medicine

## 2014-08-19 DIAGNOSIS — R0609 Other forms of dyspnea: Secondary | ICD-10-CM | POA: Diagnosis not present

## 2014-08-19 DIAGNOSIS — R0600 Dyspnea, unspecified: Secondary | ICD-10-CM | POA: Diagnosis not present

## 2014-08-19 LAB — PULMONARY FUNCTION TEST

## 2014-08-19 NOTE — Progress Notes (Signed)
6 min walk performed today.

## 2014-08-19 NOTE — Progress Notes (Signed)
PFT PERFORMED TODAY. 

## 2014-08-20 ENCOUNTER — Ambulatory Visit: Payer: Medicare Other

## 2014-08-22 NOTE — Op Note (Signed)
PATIENT NAME:  April [name], BURIANEK MR#:  [id number] DATE OF BIRTH:  10/10/1940  DATE OF PROCEDURE:  12/29/2013  PREOPERATIVE DIAGNOSIS: Nuclear sclerotic cataract, left eye.   POSTOPERATIVE DIAGNOSIS: Nuclear sclerotic cataract, left eye.   PROCEDURE PERFORMED: Extracapsular cataract extraction using phacoemulsification with placement of Alcon [id number], 19.5 diopter posterior chamber lens, serial number [id number].   SURGEON: Loura Back. Jermond Burkemper, M.D.   ANESTHESIA: Lidocaine 4% and 0.75% Marcaine, a 50-50 mixture with 10 units/mL of Hylenex added, given as a peribulbar.   ANESTHESIOLOGIST: Dr. Myra Gianotti.   COMPLICATIONS: None.   ESTIMATED BLOOD LOSS: Less than 1 mL.   DESCRIPTION OF PROCEDURE:   The patient was brought to the operating room and given a peribulbar block.  The patient was then prepped and draped in the usual fashion.  The vertical rectus muscles were imbricated using 5-0 silk sutures.  These sutures were then clamped to the sterile drapes as bridle sutures.  A limbal peritomy was performed extending two clock hours and hemostasis was obtained with cautery.  A partial thickness scleral groove was made at the surgical limbus and dissected anteriorly in a lamellar dissection using an Alcon crescent knife.  The anterior chamber was entered supero-temporally with a Superblade and through the lamellar dissection with a 2.6 mm keratome.  DisCoVisc was used to replace the aqueous and a continuous tear capsulorrhexis was carried out.  Hydrodissection and hydrodelineation were carried out with balanced salt and a 27 gauge canula.  The nucleus was rotated to confirm the effectiveness of the hydrodissection.  Phacoemulsification was carried out using a divide-and-conquer technique.  Total ultrasound time was 1 minute and 3 seconds with an average power of 22.6%.   Irrigation/aspiration was used to remove the residual cortex.  DisCoVisc was used to inflate the capsule and the internal incision  was enlarged to 3 mm with the crescent knife.  The intraocular lens was folded and inserted into the capsular bag using the AcrySert delivery system.  Irrigation/aspiration was used to remove the residual DisCoVisc.  Miostat was injected into the anterior chamber through the paracentesis track to inflate the anterior chamber and induce miosis.  The wound was checked for leaks and none were found. The conjunctiva was closed with cautery and the bridle sutures were removed.  Two drops of 0.3% Vigamox were placed on the eye.  .1 mL of cefuroxime containing 1 mg of drug was injected via the paracentesis tract.  An eye shield was placed on the eye.  The patient was discharged to the recovery room in good condition.   ____________________________ Loura Back Jhordan Mckibben, MD sad:lr D: 12/29/2013 13:28:33 ET T: 12/29/2013 14:22:25 ET JOB#: [id number]  cc: Remo Lipps A. Malayia Spizzirri, MD, <Dictator>   Martie Lee MD ELECTRONICALLY SIGNED 01/06/2014 11:24

## 2014-08-22 NOTE — Op Note (Signed)
PATIENT NAMETRANIA, LEHL MR#:  [id number] DATE OF BIRTH:  October 31, 1940  DATE OF PROCEDURE:  04/06/2014  PREOPERATIVE DIAGNOSIS:  Cataract, right eye.   POSTOPERATIVE DIAGNOSIS:  Cataract, right eye.  PROCEDURE PERFORMED:  Extracapsular cataract extraction using phacoemulsification with placement of an Alcon SN6CWS 19.0-diopter posterior chamber lens, serial # [id number].    SURGEON:  Loura Back. Kielyn Kardell, MD  ASSISTANT:  None.  ANESTHESIA:  4% lidocaine and 0.75% Marcaine in a 50/50 mixture with 10 units per mL of Hylenex added, given his peribulbar.  ANESTHESIOLOGIST:  Gunnar Fusi, MD   COMPLICATIONS:  None.  ESTIMATED BLOOD LOSS:  Less than 1 ml.  DESCRIPTION OF PROCEDURE:  The patient was brought to the operating room and given a peribulbar block.  The patient was then prepped and draped in the usual fashion.  The vertical rectus muscles were imbricated using 5-0 silk sutures.  These sutures were then clamped to the sterile drapes as bridle sutures.  A limbal peritomy was performed extending two clock hours and hemostasis was obtained with cautery.  A partial thickness scleral groove was made at the surgical limbus and dissected anteriorly in a lamellar dissection using an Alcon crescent knife.  The anterior chamber was entered superonasally with a Superblade and through the lamellar dissection with a 2.6 mm keratome.  DisCoVisc was used to replace the aqueous and a continuous tear capsulorrhexis was carried out.  Hydrodissection and hydrodelineation were carried out with balanced salt and a 27 gauge canula.  The nucleus was rotated to confirm the effectiveness of the hydrodissection.  Phacoemulsification was carried out using a divide-and-conquer technique.  Total ultrasound time was 1 minute and 16 seconds, average power of  22.3%, CDE of 27.57.  No suture was placed.     Irrigation/aspiration was used to remove the residual cortex.  DisCoVisc was used to inflate the  capsule and the internal incision was enlarged to 3 mm with the crescent knife.  The intraocular lens was folded and inserted into the capsular bag using AcrySert Delivery System was used instead of a Librarian, academic.  Then 0.10 mL of Vigamox containing 0.1 mg of drug was injected via the paracentesis track. Irrigation/aspiration was used to remove the residual DisCoVisc.  Miostat was injected into the anterior chamber through the paracentesis track to inflate the anterior chamber and induce miosis.  The wound was checked for leaks and none were found. The conjunctiva was closed with cautery and the bridle sutures were removed.  Two drops of 0.3% Vigamox were placed on the eye.   An eye shield was placed on the eye.  The patient was discharged to the recovery room in good condition.    ____________________________ Loura Back Lot Medford, MD sad:ap D: 04/06/2014 13:37:46 ET T: 04/06/2014 14:07:30 ET JOB#: [id number]  cc: Remo Lipps A. Lajoyce Tamura, MD, <Dictator> Martie Lee MD ELECTRONICALLY SIGNED 04/13/2014 10:48

## 2014-08-24 ENCOUNTER — Ambulatory Visit: Payer: Medicare Other | Admitting: Internal Medicine

## 2014-08-25 ENCOUNTER — Encounter: Payer: Self-pay | Admitting: Internal Medicine

## 2014-08-25 ENCOUNTER — Ambulatory Visit (INDEPENDENT_AMBULATORY_CARE_PROVIDER_SITE_OTHER): Payer: Medicare Other | Admitting: Internal Medicine

## 2014-08-25 VITALS — BP 120/60 | HR 77 | Temp 99.0°F | Ht 60.0 in | Wt 175.0 lb

## 2014-08-25 DIAGNOSIS — K449 Diaphragmatic hernia without obstruction or gangrene: Secondary | ICD-10-CM | POA: Insufficient documentation

## 2014-08-25 DIAGNOSIS — R0609 Other forms of dyspnea: Secondary | ICD-10-CM

## 2014-08-25 DIAGNOSIS — G4733 Obstructive sleep apnea (adult) (pediatric): Secondary | ICD-10-CM | POA: Diagnosis not present

## 2014-08-25 DIAGNOSIS — R0600 Dyspnea, unspecified: Secondary | ICD-10-CM

## 2014-08-25 DIAGNOSIS — R0602 Shortness of breath: Secondary | ICD-10-CM | POA: Diagnosis not present

## 2014-08-25 NOTE — Assessment & Plan Note (Signed)
CPAP titration study results showed optimal pressure of 11cmH20  Plan: -CPAP 11cmH2O, referral made to [facility]

## 2014-08-25 NOTE — Assessment & Plan Note (Signed)
Patient with large hiatal hernia, which could be adding to her dyspnea  Plan - follow up with surgeon, Dr. Marta Lamas.

## 2014-08-25 NOTE — Patient Instructions (Addendum)
Follow up with Dr. Stevenson Clinch in 3 months - CPAP 11cm H2O, we will send an order to [place] with exercise as tolerated - healthy diet and weight loss.  - we will refer your to Dr. Marta Lamas for further evaluation of your large hiatal hernia.

## 2014-08-25 NOTE — Progress Notes (Signed)
MRN# [id number] April [name] [birth year]  PMD - C. Scott  CC: Chief Complaint  Patient presents with  . Follow-up    f/u smw/pft/split night.      Brief History: HPI 06/2014 Patient is a pleasant 74 year old female presents today for further evaluation of shortness of breath is becoming worse over the past one year. Patient states that she also has dyspnea on exertion, walking from the parking lot to the office today elicited dyspnea, she also states that she cannot climb 1 flight of stairs. Over the last year for shortness of breath has been gradual in nature, she describes the sensation as "winded" and being fatigued. She has a history of a hiatal hernia. Patient was also seen by cardiology, Dr. Candis Musa, workup included cardiac catheterization, which was read as normal. Patient says she has intermittent cough at times which is nonproductive. She cannot identify any factors and the last year that may have caused worsening shortness of breath or any relation to allergies or environmental changes. She has a history of obstructive sleep apnea for which she wears BiPAP nightly since 2012; however over the past 2-3 weeks she has not worn her BiPAP due to having nasal congestion and suspected sinusitis. Patient is a never smoker, but does endorse exposure to secondhand smoke when she lived at home up until the age of 74 years old. PLAN:pfts, 1mwt, split night study    Events since last clinic visit: Patient presents for a follow up visit today. Had sleep titration study done along with pfts and 66mwt. Currently wearing cpap 5/7 nights.  No worsening sob since her last visit, no recent ED\Urgent care visits.        PMHX:   Past Medical History  Diagnosis Date  . Hypertension   . Hypercholesterolemia   . History of colon polyps 08/1994  . Osteoporosis   . Sleep apnea     CPAP  . Arrhythmia     history of an irregular heart beat  . Diverticulitis   . Urine incontinence   .  Rheumatic fever   . Migraines     history of migraines  . Endometriosis    Surgical Hx:  Past Surgical History  Procedure Laterality Date  . Abdominal hysterectomy      ovaries not removed  . Tonsillectomy    . Breast biopsies      x2  . Appendectomy      was removed during hysterectomy  . Breast surgery Bilateral   . Coronary angioplasty  2012  . Colonoscopy  2013   Family Hx:  Family History  Problem Relation Age of Onset  . Arthritis Mother   . Stroke Mother   . Hypertension Mother   . Heart disease Father     MI  . Stroke Brother   . Osteoporosis Mother   . Breast cancer      first cousin x 2  . Colon cancer Neg Hx    Social Hx:   History  Substance Use Topics  . Smoking status: Never Smoker   . Smokeless tobacco: Never Used  . Alcohol Use: No   Medication:   Current Outpatient Rx  Name  Route  Sig  Dispense  Refill  . aspirin 81 MG tablet   Oral   Take 81 mg by mouth daily.         . calcium carbonate (TUMS - DOSED IN MG ELEMENTAL CALCIUM) 500 MG chewable tablet   Oral   Chew 1 tablet  by mouth daily as needed.          . carvedilol (COREG) 25 MG tablet      TAKE ONE TABLET TWICE DAILY WITH A MEAL   60 tablet   5   . Dextromethorphan-Guaifenesin (ROBITUSSIN DM PO)   Oral   Take by mouth 2 (two) times daily.         . fish oil-omega-3 fatty acids 1000 MG capsule   Oral   Take 1 g by mouth daily.         . fluticasone (FLONASE) 50 MCG/ACT nasal spray   Each Nare   Place 2 sprays into both nostrils daily.         Marland Kitchen losartan-hydrochlorothiazide (HYZAAR) 100-12.5 MG per tablet      TAKE ONE (1) TABLET EACH DAY   30 tablet   6   . Misc Natural Products (OSTEO BI-FLEX JOINT SHIELD PO)   Oral   Take by mouth 2 (two) times daily.         . Multiple Vitamin (MULTIVITAMIN) tablet   Oral   Take 1 tablet by mouth daily.         . pravastatin (PRAVACHOL) 20 MG tablet      TAKE ONE (1) TABLET EACH DAY   30 tablet   6   .  VITAMIN D, CHOLECALCIFEROL, PO   Oral   Take 1,000 Units by mouth 2 (two) times daily.             Review of Systems: Gen:  Denies  fever, sweats, chills HEENT: Denies blurred vision, double vision, ear pain, eye pain, hearing loss, nose bleeds, sore throat Cvc:  No dizziness, chest pain or heaviness Resp:   Denies cough or sputum porduction, shortness of breath Gi: Denies swallowing difficulty, stomach pain, nausea or vomiting, diarrhea, constipation, bowel incontinence Gu:  Denies bladder incontinence, burning urine Ext:   No Joint pain, stiffness or swelling Skin: No skin rash, easy bruising or bleeding or hives Endoc:  No polyuria, polydipsia , polyphagia or weight change Psych: No depression, insomnia or hallucinations  Other:  All other systems negative  Allergies:  Penicillins and Sulfa antibiotics  Physical Examination:  VS: BP 120/60 mmHg  Pulse 77  Temp(Src) 99 F (37.2 C) (Oral)  Ht 5' (1.524 m)  Wt 175 lb (79.379 kg)  BMI 34.18 kg/m2  SpO2 97%  LMP 05/01/1972  General Appearance: No distress  Neuro: EXAM: without focal findings, mental status, speech normal, alert and oriented, cranial nerves 2-12 grossly normal  HEENT: PERRLA, EOM intact, no ptosis, no other lesions noticed Pulmonary:Exam: normal breath sounds., diaphragmatic excursion normal.No wheezing, No rales   Cardiovascular:@ Exam:  Normal S1,S2.  No m/r/g.     Abdomen:Exam: Benign, Soft, non-tender, No masses  Skin:   warm, no rashes, no ecchymosis  Extremities: normal, no cyanosis, clubbing, no edema, warm with normal capillary refill.   Labs results:  BMP Lab Results  Component Value Date   NA 146* 04/28/2014   K 4.8 04/28/2014   CL 108 04/28/2014   CO2 31 04/28/2014   GLUCOSE 100* 04/28/2014   BUN 20 04/28/2014   CREATININE 0.8 04/28/2014     CBC CBC Latest Ref Rng 12/03/2013 10/17/2012  WBC 4.0 - 10.5 K/uL 6.1 6.6  Hemoglobin 12.0 - 15.0 g/dL 12.7 12.4  Hematocrit 36.0 - 46.0 % 37.9  37.9  Platelets 150.0 - 400.0 K/uL 225.0 238.0     Rad results: (The following images and  results were reviewed by Dr. Stevenson Clinch).  6MWT - 357ft/113.11m, no desaturations, hip pain  PFTs - 08/25/14 FEV1 -106% FEV1/FVC-85 DLCO-79  Impression - normal pfts.      Assessment and Plan: Obstructive sleep apnea CPAP titration study results showed optimal pressure of 11cmH20  Plan: -CPAP 11cmH2O, referral made to [facility]    SOB (shortness of breath) See plan for dyspnea on exertion.     DOE (dyspnea on exertion) I believe that her dyspnea on exertion is multifactorial in nature due to factors such as: Deconditioning, obstructive sleep apnea, obesity, hiatal hernia  Normal PFTS, 60mwt with only 372 feet. Most likely deconditioning.  Plan  - continue with CPAP - referral to Dr. Marta Lamas for evaluation of hiatal hernia - exercise as tolerated.     Hiatal hernia Patient with large hiatal hernia, which could be adding to her dyspnea  Plan - follow up with surgeon, Dr. Marta Lamas.      Updated Medication List Outpatient Encounter Prescriptions as of 08/25/2014  Medication Sig  . aspirin 81 MG tablet Take 81 mg by mouth daily.  . calcium carbonate (TUMS - DOSED IN MG ELEMENTAL CALCIUM) 500 MG chewable tablet Chew 1 tablet by mouth daily as needed.   . carvedilol (COREG) 25 MG tablet TAKE ONE TABLET TWICE DAILY WITH A MEAL  . Dextromethorphan-Guaifenesin (ROBITUSSIN DM PO) Take by mouth 2 (two) times daily.  . fish oil-omega-3 fatty acids 1000 MG capsule Take 1 g by mouth daily.  . fluticasone (FLONASE) 50 MCG/ACT nasal spray Place 2 sprays into both nostrils daily.  Marland Kitchen losartan-hydrochlorothiazide (HYZAAR) 100-12.5 MG per tablet TAKE ONE (1) TABLET EACH DAY  . Misc Natural Products (OSTEO BI-FLEX JOINT SHIELD PO) Take by mouth 2 (two) times daily.  . Multiple Vitamin (MULTIVITAMIN) tablet Take 1 tablet by mouth daily.  . pravastatin (PRAVACHOL) 20 MG tablet TAKE ONE (1) TABLET  EACH DAY  . VITAMIN D, CHOLECALCIFEROL, PO Take 1,000 Units by mouth 2 (two) times daily.     Orders for this visit: No orders of the defined types were placed in this encounter.    Thank  you for the visitation and for allowing  [facility] to assist in the care of your patient. Our recommendations are noted above.  Please contact us if we can be of further service.  Vilinda Boehringer, MD [facility] Office Number: [phone]

## 2014-08-25 NOTE — Assessment & Plan Note (Signed)
I believe that her dyspnea on exertion is multifactorial in nature due to factors such as: Deconditioning, obstructive sleep apnea, obesity, hiatal hernia  Normal PFTS, 66mwt with only 372 feet. Most likely deconditioning.  Plan  - continue with CPAP - referral to Dr. Marta Lamas for evaluation of hiatal hernia - exercise as tolerated.

## 2014-08-25 NOTE — Assessment & Plan Note (Signed)
See plan for dyspnea on exertion 

## 2014-09-07 ENCOUNTER — Encounter: Payer: Self-pay | Admitting: Internal Medicine

## 2014-09-07 ENCOUNTER — Ambulatory Visit (INDEPENDENT_AMBULATORY_CARE_PROVIDER_SITE_OTHER): Payer: Medicare Other | Admitting: Internal Medicine

## 2014-09-07 VITALS — BP 138/72 | HR 62 | Temp 98.1°F | Ht 60.0 in | Wt 172.5 lb

## 2014-09-07 DIAGNOSIS — K219 Gastro-esophageal reflux disease without esophagitis: Secondary | ICD-10-CM | POA: Diagnosis not present

## 2014-09-07 DIAGNOSIS — G4733 Obstructive sleep apnea (adult) (pediatric): Secondary | ICD-10-CM | POA: Diagnosis not present

## 2014-09-07 DIAGNOSIS — K449 Diaphragmatic hernia without obstruction or gangrene: Secondary | ICD-10-CM

## 2014-09-07 DIAGNOSIS — I10 Essential (primary) hypertension: Secondary | ICD-10-CM

## 2014-09-07 DIAGNOSIS — Z658 Other specified problems related to psychosocial circumstances: Secondary | ICD-10-CM

## 2014-09-07 DIAGNOSIS — E7800 Pure hypercholesterolemia, unspecified: Secondary | ICD-10-CM

## 2014-09-07 DIAGNOSIS — Z860100 Personal history of colon polyps, unspecified: Secondary | ICD-10-CM

## 2014-09-07 DIAGNOSIS — F439 Reaction to severe stress, unspecified: Secondary | ICD-10-CM

## 2014-09-07 DIAGNOSIS — E669 Obesity, unspecified: Secondary | ICD-10-CM

## 2014-09-07 DIAGNOSIS — Z0000 Encounter for general adult medical examination without abnormal findings: Secondary | ICD-10-CM

## 2014-09-07 NOTE — Progress Notes (Signed)
Pre visit review using our clinic review tool, if applicable. No additional management support is needed unless otherwise documented below in the visit note. 

## 2014-09-07 NOTE — Progress Notes (Signed)
Patient ID: April [name], female   DOB: Oct 13, 1940, 74 y.o.   MRN: [id number]   Subjective:    Patient ID: April [name], female    DOB: December 16, 1940, 74 y.o.   MRN: [id number]  HPI  Patient here for a scheduled follow up.  Saw pulmonary for her sob and doe.  See Dr Merian Capron note for details.  Some sob with exertion.  CT - per note.  Felt hiatal hernia contributing to the sob.  Planning to see Dr Jamal Collin for evaluation.  Eating and drinking well.  No nausea or vomiting.  No increased acid reflux.  Bowels stable.  Increased stress.  Appears to be coping relatively well.     Past Medical History  Diagnosis Date  . Hypertension   . Hypercholesterolemia   . History of colon polyps 08/1994  . Osteoporosis   . Sleep apnea     CPAP  . Arrhythmia     history of an irregular heart beat  . Diverticulitis   . Urine incontinence   . Rheumatic fever   . Migraines     history of migraines  . Endometriosis     Current Outpatient Prescriptions on File Prior to Visit  Medication Sig Dispense Refill  . aspirin 81 MG tablet Take 81 mg by mouth daily.    . calcium carbonate (TUMS - DOSED IN MG ELEMENTAL CALCIUM) 500 MG chewable tablet Chew 1 tablet by mouth daily as needed.     . carvedilol (COREG) 25 MG tablet TAKE ONE TABLET TWICE DAILY WITH A MEAL 60 tablet 5  . fish oil-omega-3 fatty acids 1000 MG capsule Take 1 g by mouth daily.    . fluticasone (FLONASE) 50 MCG/ACT nasal spray Place 2 sprays into both nostrils daily.    Marland Kitchen losartan-hydrochlorothiazide (HYZAAR) 100-12.5 MG per tablet TAKE ONE (1) TABLET EACH DAY 30 tablet 6  . Misc Natural Products (OSTEO BI-FLEX JOINT SHIELD PO) Take by mouth 2 (two) times daily.    . Multiple Vitamin (MULTIVITAMIN) tablet Take 1 tablet by mouth daily.    . pravastatin (PRAVACHOL) 20 MG tablet TAKE ONE (1) TABLET EACH DAY 30 tablet 6  . VITAMIN D, CHOLECALCIFEROL, PO Take 1,000 Units by mouth 2 (two) times daily.      No current facility-administered  medications on file prior to visit.    Review of Systems  Constitutional: Negative for appetite change and unexpected weight change.  HENT: Negative for congestion and sinus pressure.   Respiratory: Negative for cough, chest tightness and shortness of breath.   Cardiovascular: Negative for chest pain and palpitations.  Gastrointestinal: Negative for nausea, vomiting, abdominal pain and diarrhea.  Skin: Negative for color change and rash.  Neurological: Negative for dizziness, light-headedness and headaches.  Hematological: Negative for adenopathy. Does not bruise/bleed easily.  Psychiatric/Behavioral: Negative for dysphoric mood and agitation.       Objective:     Blood pressure recheck:  138/72  Physical Exam  Constitutional: She appears well-developed and well-nourished. No distress.  HENT:  Nose: Nose normal.  Mouth/Throat: Oropharynx is clear and moist.  Neck: Neck supple. No thyromegaly present.  Cardiovascular: Normal rate and regular rhythm.   Pulmonary/Chest: Breath sounds normal. No respiratory distress. She has no wheezes.  Abdominal: Soft. Bowel sounds are normal. There is no tenderness.  Musculoskeletal: She exhibits no edema or tenderness.  Lymphadenopathy:    She has no cervical adenopathy.  Skin: No rash noted. No erythema.  Psychiatric: She has a normal mood  and affect. Her behavior is normal.    BP 138/72 mmHg  Pulse 62  Temp(Src) 98.1 F (36.7 C) (Oral)  Ht 5' (1.524 m)  Wt 172 lb 8 oz (78.245 kg)  BMI 33.69 kg/m2  SpO2 98%  LMP 05/01/1972 Wt Readings from Last 3 Encounters:  09/07/14 172 lb 8 oz (78.245 kg)  08/25/14 175 lb (79.379 kg)  07/15/14 175 lb (79.379 kg)     Lab Results  Component Value Date   WBC 6.1 12/03/2013   HGB 12.7 12/03/2013   HCT 37.9 12/03/2013   PLT 225.0 12/03/2013   GLUCOSE 100* 04/28/2014   CHOL 200 04/28/2014   TRIG 78.0 04/28/2014   HDL 56.30 04/28/2014   LDLCALC 128* 04/28/2014   ALT 16 04/28/2014   AST 26  04/28/2014   NA 146* 04/28/2014   K 4.8 04/28/2014   CL 108 04/28/2014   CREATININE 0.8 04/28/2014   BUN 20 04/28/2014   CO2 31 04/28/2014   TSH 1.40 12/03/2013   HGBA1C 6.6* 04/28/2014       Assessment & Plan:   Problem List Items Addressed This Visit    Essential hypertension, benign - Primary    Blood pressure on recheck 138/72.  Same medication regimen.  Follow pressures.  Follow metabolic panel.        Relevant Orders   TSH   GERD (gastroesophageal reflux disease)    Remains on nexium.  Controlled.        Health care maintenance    Physical 10/21/13.  Colonoscopy 06/13/11.  Mammogram 06/29/14 - Birads I.        Hiatal hernia    Pt with large hiatal hernia.  Planning to see Dr Jamal Collin for evaluation.  Felt to be contributing to her sob.        Relevant Orders   CBC with Differential/Platelet   History of colonic polyps    Colonoscopy 06/23/11 - diverticulosis otherwise normal.  Recommend f/u colonoscopy in five years.        Hypercholesterolemia    Low cholesterol diet and exercise.  Follow lipid panel and liver function tests.  Continue pravastatin.   Lab Results  Component Value Date   CHOL 200 04/28/2014   HDL 56.30 04/28/2014   LDLCALC 128* 04/28/2014   TRIG 78.0 04/28/2014   CHOLHDL 4 04/28/2014        Obesity (BMI 30-39.9)    Diet and exercise.  Follow.       Obstructive sleep apnea    Had CPAP titration 08/03/14.  Using CPAP now.  Follow.       Stress    Overall doing well.  Follow.         I spent 25 minutes with the patient and more than 50% of the time was spent in consultation regarding the above.     Einar Pheasant, MD

## 2014-09-08 ENCOUNTER — Ambulatory Visit: Payer: Medicare Other | Admitting: General Surgery

## 2014-09-13 ENCOUNTER — Encounter: Payer: Self-pay | Admitting: Internal Medicine

## 2014-09-13 NOTE — Assessment & Plan Note (Signed)
Remains on nexium.  Controlled.

## 2014-09-13 NOTE — Assessment & Plan Note (Signed)
Diet and exercise.  Follow.  

## 2014-09-13 NOTE — Assessment & Plan Note (Signed)
Had CPAP titration 08/03/14.  Using CPAP now.  Follow.

## 2014-09-13 NOTE — Assessment & Plan Note (Signed)
Colonoscopy 06/23/11 - diverticulosis otherwise normal.  Recommend f/u colonoscopy in five years.

## 2014-09-13 NOTE — Assessment & Plan Note (Signed)
Overall doing well.  Follow.   

## 2014-09-13 NOTE — Assessment & Plan Note (Signed)
Blood pressure on recheck 138/72.  Same medication regimen.  Follow pressures.  Follow metabolic panel.

## 2014-09-13 NOTE — Assessment & Plan Note (Signed)
Pt with large hiatal hernia.  Planning to see Dr Jamal Collin for evaluation.  Felt to be contributing to her sob.

## 2014-09-13 NOTE — Assessment & Plan Note (Signed)
Low cholesterol diet and exercise.  Follow lipid panel and liver function tests.  Continue pravastatin.   Lab Results  Component Value Date   CHOL 200 04/28/2014   HDL 56.30 04/28/2014   LDLCALC 128* 04/28/2014   TRIG 78.0 04/28/2014   CHOLHDL 4 04/28/2014

## 2014-09-13 NOTE — Assessment & Plan Note (Signed)
Physical 10/21/13.  Colonoscopy 06/13/11.  Mammogram 06/29/14 - Birads I.

## 2014-10-03 ENCOUNTER — Other Ambulatory Visit: Payer: Self-pay | Admitting: Internal Medicine

## 2014-10-06 ENCOUNTER — Encounter: Payer: Self-pay | Admitting: General Surgery

## 2014-10-06 ENCOUNTER — Ambulatory Visit (INDEPENDENT_AMBULATORY_CARE_PROVIDER_SITE_OTHER): Payer: Medicare Other | Admitting: General Surgery

## 2014-10-06 VITALS — BP 138/82 | HR 68 | Resp 14 | Ht 60.0 in | Wt 171.0 lb

## 2014-10-06 DIAGNOSIS — K219 Gastro-esophageal reflux disease without esophagitis: Secondary | ICD-10-CM

## 2014-10-06 NOTE — Patient Instructions (Signed)
The patient is aware to call back for any questions or concerns. Hiatal Hernia A hiatal hernia occurs when part of your stomach slides above the muscle that separates your abdomen from your chest (diaphragm). You can be born with a hiatal hernia (congenital), or it may develop over time. In almost all cases of hiatal hernia, only the top part of the stomach pushes through.  Many people have a hiatal hernia with no symptoms. The larger the hernia, the more likely that you will have symptoms. In some cases, a hiatal hernia allows stomach acid to flow back into the tube that carries food from your mouth to your stomach (esophagus). This may cause heartburn symptoms. Severe heartburn symptoms may mean you have developed a condition called gastroesophageal reflux disease (GERD).  CAUSES  Hiatal hernias are caused by a weakness in the opening (hiatus) where your esophagus passes through your diaphragm to attach to the upper part of your stomach. You may be born with a weakness in your hiatus, or a weakness can develop. RISK FACTORS Older age is a major risk factor for a hiatal hernia. Anything that increases pressure on your diaphragm can also increase your risk of a hiatal hernia. This includes:  Pregnancy.  Excess weight.  Frequent constipation. SIGNS AND SYMPTOMS  People with a hiatal hernia often have no symptoms. If symptoms develop, they are almost always caused by GERD. They may include:  Heartburn.  Belching.  Indigestion.  Trouble swallowing.  Coughing or wheezing.  Sore throat.  Hoarseness.  Chest pain. DIAGNOSIS  A hiatal hernia is sometimes found during an exam for another problem. Your health care provider may suspect a hiatal hernia if you have symptoms of GERD. Tests may be done to diagnose GERD. These may include:  X-rays of your stomach or chest.  An upper gastrointestinal (GI) series. This is an X-ray exam of your GI tract involving the use of a chalky liquid that  you swallow. The liquid shows up clearly on the X-ray.  Endoscopy. This is a procedure to look into your stomach using a thin, flexible tube that has a tiny camera and light on the end of it. TREATMENT  If you have no symptoms, you may not need treatment. If you have symptoms, treatment may include:  Dietary and lifestyle changes to help reduce GERD symptoms.  Medicines. These may include:  Over-the-counter antacids.  Medicines that make your stomach empty more quickly.  Medicines that block the production of stomach acid (H2 blockers).  Stronger medicines to reduce stomach acid (proton pump inhibitors).  You may need surgery to repair the hernia if other treatments are not helping. HOME CARE INSTRUCTIONS   Take all medicines as directed by your health care provider.  Quit smoking, if you smoke.  Try to achieve and maintain a healthy body weight.  Eat frequent small meals instead of three large meals a day. This keeps your stomach from getting too full.  Eat slowly.  Do not lie down right after eating.  Do noteat 1-2 hours before bed.   Do not drink beverages with caffeine. These include cola, coffee, cocoa, and tea.  Do not drink alcohol.  Avoid foods that can make symptoms of GERD worse. These may include:  Fatty foods.  Citrus fruits.  Other foods and drinks that contain acid.  Avoid putting pressure on your belly. Anything that puts pressure on your belly increases the amount of acid that may be pushed up into your esophagus.   Avoid  bending over, especially after eating.  Raise the head of your bed by putting blocks under the legs. This keeps your head and esophagus higher than your stomach.  Do not wear tight clothing around your chest or stomach.  Try not to strain when having a bowel movement, when urinating, or when lifting heavy objects. SEEK MEDICAL CARE IF:  Your symptoms are not controlled with medicines or lifestyle changes.  You are  having trouble swallowing.  You have coughing or wheezing that will not go away. SEEK IMMEDIATE MEDICAL CARE IF:  Your pain is getting worse.  Your pain spreads to your arms, neck, jaw, teeth, or back.  You have shortness of breath.  You sweat for no reason.  You feel sick to your stomach (nauseous) or vomit.  You vomit blood.  You have bright red blood in your stools.  You have black, tarry stools.  Document Released: 07/08/2003 Document Revised: 09/01/2013 Document Reviewed: 04/04/2013 [facility] : [facility] Patient Information 2015 [place], [place]. This information is not intended to replace advice given to you by your health care provider. Make sure you discuss any questions you have with your health care provider.

## 2014-10-06 NOTE — Progress Notes (Signed)
Patient ID: April [name], female   DOB: 04/25/1941, 74 y.o.   MRN: [id number]  Chief Complaint  Patient presents with  . Other    hiatal hernia    HPI April [name] is a 74 y.o. female here today for evaluation of hiatal hernia. Patient reports having shortness of breath, she states it is hard to catch her breath at times. She states she has acid reflux, she changed her diet due to this. She does have to take her time to chew food has problems swallowing but not all the time. No pain associated with hiatal hernia.  HPI  Past Medical History  Diagnosis Date  . Hypertension   . Hypercholesterolemia   . History of colon polyps 08/1994  . Osteoporosis   . Sleep apnea     CPAP  . Arrhythmia     history of an irregular heart beat  . Diverticulitis   . Urine incontinence   . Rheumatic fever   . Migraines     history of migraines  . Endometriosis   . Hiatal hernia     Past Surgical History  Procedure Laterality Date  . Abdominal hysterectomy      ovaries not removed  . Tonsillectomy    . Breast biopsies      x2  . Appendectomy      was removed during hysterectomy  . Breast surgery Bilateral   . Coronary angioplasty  2012  . Colonoscopy  2013    Family History  Problem Relation Age of Onset  . Arthritis Mother   . Stroke Mother   . Hypertension Mother   . Heart disease Father     MI  . Stroke Brother   . Osteoporosis Mother   . Breast cancer      first cousin x 2  . Colon cancer Neg Hx     Social History History  Substance Use Topics  . Smoking status: Never Smoker   . Smokeless tobacco: Never Used  . Alcohol Use: No    Allergies  Allergen Reactions  . Penicillins     Okay to take amoxicillin   . Sulfa Antibiotics     Current Outpatient Prescriptions  Medication Sig Dispense Refill  . aspirin 81 MG tablet Take 81 mg by mouth daily.    . calcium carbonate (TUMS - DOSED IN MG ELEMENTAL CALCIUM) 500 MG chewable tablet Chew 1 tablet by mouth daily  as needed.     . carvedilol (COREG) 25 MG tablet TAKE ONE TABLET TWICE DAILY WITH A MEAL 60 tablet 5  . fish oil-omega-3 fatty acids 1000 MG capsule Take 1 [name] by mouth daily.    . fluticasone (FLONASE) 50 MCG/ACT nasal spray Place 2 sprays into both nostrils daily.    Marland Kitchen losartan-hydrochlorothiazide (HYZAAR) 100-12.5 MG per tablet TAKE ONE (1) TABLET EACH DAY 30 tablet 6  . Misc Natural Products (OSTEO BI-FLEX JOINT SHIELD PO) Take by mouth 2 (two) times daily.    . Multiple Vitamin (MULTIVITAMIN) tablet Take 1 tablet by mouth daily.    . pravastatin (PRAVACHOL) 20 MG tablet TAKE ONE (1) TABLET EACH DAY 30 tablet 6  . VITAMIN D, CHOLECALCIFEROL, PO Take 1,000 Units by mouth 2 (two) times daily.      No current facility-administered medications for this visit.    Review of Systems Review of Systems  Constitutional: Negative.   Respiratory: Positive for shortness of breath and wheezing.   Cardiovascular: Negative.     Blood pressure  138/82, pulse 68, resp. rate 14, height 5' (1.524 m), weight 171 lb (77.565 kg), last menstrual period 05/01/1972.  Physical Exam Physical Exam Not performed Data Reviewed Pulmonary function test.  Assessment   No available notes from pulmonologist. Not sure if GERD is responsible for pt's shortness of breath       Plan    Follow up after talking with Dr Stevenson Clinch.      [id number]  April [name] 10/06/2014, 3:52 PM

## 2014-10-26 DIAGNOSIS — I10 Essential (primary) hypertension: Secondary | ICD-10-CM | POA: Diagnosis not present

## 2014-10-26 DIAGNOSIS — L03119 Cellulitis of unspecified part of limb: Secondary | ICD-10-CM | POA: Diagnosis not present

## 2014-11-05 ENCOUNTER — Telehealth: Payer: Self-pay | Admitting: *Deleted

## 2014-11-05 NOTE — Telephone Encounter (Signed)
-----   Message from Catha Gosselin sent at 08/31/2014  3:21 PM EDT ----- Regarding: f/u on surgical appointment with Dr. Marta Lamas Dr. Stevenson Clinch had placed a referral for [facility] to see patient to Eval of Hiatal Hernia.   Called to f/u on referral and per Sharyn Lull at [facility] their docs are not seeing patients for Hiatal Hernia's at the present time. Per Sharyn Lull, she spoke with Mrs. Manka today and pt told her she already had a Psychologist, sport and exercise and would just see him.  Just wanted you to know. I will contact Mrs. Agar to see who we need to send this referral to.  Miss you! Suanne Marker

## 2014-11-23 ENCOUNTER — Ambulatory Visit (INDEPENDENT_AMBULATORY_CARE_PROVIDER_SITE_OTHER): Payer: Medicare Other | Admitting: Internal Medicine

## 2014-11-23 ENCOUNTER — Encounter: Payer: Self-pay | Admitting: Internal Medicine

## 2014-11-23 VITALS — BP 130/80 | HR 74 | Temp 97.9°F | Ht 60.0 in | Wt 174.4 lb

## 2014-11-23 DIAGNOSIS — K449 Diaphragmatic hernia without obstruction or gangrene: Secondary | ICD-10-CM

## 2014-11-23 DIAGNOSIS — R0602 Shortness of breath: Secondary | ICD-10-CM

## 2014-11-23 DIAGNOSIS — R0609 Other forms of dyspnea: Secondary | ICD-10-CM | POA: Diagnosis not present

## 2014-11-23 DIAGNOSIS — R0600 Dyspnea, unspecified: Secondary | ICD-10-CM

## 2014-11-23 NOTE — Assessment & Plan Note (Signed)
See plan for dyspnea on exertion 

## 2014-11-23 NOTE — Progress Notes (Signed)
MRN# [id number] April [name] Sep 05, 1940   CC: Chief Complaint  Patient presents with  . Follow-up    Pt wears CPAP 5-9 hrs nightly. She is having trouble with her mask. Pt still has sob with exertion..      Brief History: HPI 06/2014 HPI:  Patient is a pleasant 74 year old female presents today for further evaluation of shortness of breath is becoming worse over the past one year. Patient states that she also has dyspnea on exertion, walking from the parking lot to the office today elicited dyspnea, she also states that she cannot climb 1 flight of stairs. Over the last year for shortness of breath has been gradual in nature, she describes the sensation as "winded" and being fatigued. She has a history of a hiatal hernia. Patient was also seen by cardiology, Dr. Candis Musa, workup included cardiac catheterization, which was read as normal. Patient says she has intermittent cough at times which is nonproductive. She cannot identify any factors and the last year that may have caused worsening shortness of breath or any relation to allergies or environmental changes. She has a history of obstructive sleep apnea for which she wears BiPAP nightly since 2012; however over the past 2-3 weeks she has not worn her BiPAP due to having nasal congestion and suspected sinusitis. Patient is a never smoker, but does endorse exposure to secondhand smoke when she lived at home up until the age of 74 years old. Plan - pfts, 10mwt, split night study  ROV 07/2014 Patient presents for a follow up visit today. Had sleep titration study done along with pfts and 51mwt. Currently wearing cpap 5/7 nights.  No worsening sob since her last visit, no recent ED\Urgent care visits.  Plan - cont with cpap, severe hiatal hernia, referral to surgery for large hiatal hernia, cont with exercise as tolerated. Has normal pfts (no obs process).    Events since last clinic visit: Patient presents today for follow-up visit of  dyspnea on exertion. Since her last visit she's had a sleep study done, she is wearing her CPAP machine nightly. Overall she is not as tired as she used to be before, but her dyspnea on exertion has not had any significant improvement. She still has a nasal congestion. At her last visit it was determined that one of the sources of dyspnea could be related to her hiatal hernia, she has follow-up with Dr. Jamal Collin who is providing monitoring at this time.      Medication:   Current Outpatient Rx  Name  Route  Sig  Dispense  Refill  . aspirin 81 MG tablet   Oral   Take 81 mg by mouth daily.         . calcium carbonate (TUMS - DOSED IN MG ELEMENTAL CALCIUM) 500 MG chewable tablet   Oral   Chew 1 tablet by mouth daily as needed.          . carvedilol (COREG) 25 MG tablet      TAKE ONE TABLET TWICE DAILY WITH A MEAL   60 tablet   5   . fish oil-omega-3 fatty acids 1000 MG capsule   Oral   Take 1 g by mouth daily.         . fluticasone (FLONASE) 50 MCG/ACT nasal spray   Each Nare   Place 2 sprays into both nostrils daily.         Marland Kitchen losartan-hydrochlorothiazide (HYZAAR) 100-12.5 MG per tablet      TAKE ONE (  1) TABLET EACH DAY   30 tablet   6   . Misc Natural Products (OSTEO BI-FLEX JOINT SHIELD PO)   Oral   Take by mouth 2 (two) times daily.         . Multiple Vitamin (MULTIVITAMIN) tablet   Oral   Take 1 tablet by mouth daily.         . pravastatin (PRAVACHOL) 20 MG tablet      TAKE ONE (1) TABLET EACH DAY   30 tablet   6   . VITAMIN D, CHOLECALCIFEROL, PO   Oral   Take 1,000 Units by mouth 2 (two) times daily.             Review of Systems: Gen:  Denies  fever, sweats, chills HEENT: Denies blurred vision, double vision, ear pain, eye pain, hearing loss, nose bleeds, sore throat Cvc:  No dizziness, chest pain or heaviness Resp:   Admits to: Gi: Denies swallowing difficulty, stomach pain, nausea or vomiting, diarrhea, constipation, bowel  incontinence Gu:  Denies bladder incontinence, burning urine Ext:   No Joint pain, stiffness or swelling Skin: No skin rash, easy bruising or bleeding or hives Endoc:  No polyuria, polydipsia , polyphagia or weight change Other:  All other systems negative  Allergies:  Penicillins and Sulfa antibiotics  Physical Examination:  VS: BP 130/80 mmHg  Pulse 74  Temp(Src) 97.9 F (36.6 C) (Oral)  Ht 5' (1.524 m)  Wt 174 lb 6.4 oz (79.107 kg)  BMI 34.06 kg/m2  SpO2 97%  LMP 05/01/1972  General Appearance: No distress  HEENT: PERRLA, no ptosis, no other lesions noticed Pulmonary:normal breath sounds., diaphragmatic excursion normal.No wheezing, No rales   Cardiovascular:  Normal S1,S2.  No m/r/g.     Abdomen:Exam: Benign, Soft, non-tender, No masses  Skin:   warm, no rashes, no ecchymosis  Extremities: normal, no cyanosis, clubbing, warm with normal capillary refill.      Rad results: (The following images and results were reviewed by Dr. Stevenson Clinch).     Assessment and Plan: 74 year old female seen in follow-up for dyspnea on exertion Hiatal hernia Patient with large hiatal hernia, which could be adding to her dyspnea Repeat CT chest\abdomen\pelvis     SOB (shortness of breath) See plan for dyspnea on exertion.      DOE (dyspnea on exertion) I believe that her dyspnea on exertion is multifactorial in nature due to factors such as: Deconditioning, obstructive sleep apnea, obesity, hiatal hernia  Normal PFTS, 17mwt with only 372 feet. Most likely deconditioning.  Plan  - continue with CPAP - repeat CT chest\abdomen and pelvis for further evaluation of dyspnea and hiatal hernia.  - exercise as tolerated.         Updated Medication List Outpatient Encounter Prescriptions as of 11/23/2014  Medication Sig  . aspirin 81 MG tablet Take 81 mg by mouth daily.  . calcium carbonate (TUMS - DOSED IN MG ELEMENTAL CALCIUM) 500 MG chewable tablet Chew 1 tablet by mouth daily as  needed.   . carvedilol (COREG) 25 MG tablet TAKE ONE TABLET TWICE DAILY WITH A MEAL  . fish oil-omega-3 fatty acids 1000 MG capsule Take 1 g by mouth daily.  . fluticasone (FLONASE) 50 MCG/ACT nasal spray Place 2 sprays into both nostrils daily.  Marland Kitchen losartan-hydrochlorothiazide (HYZAAR) 100-12.5 MG per tablet TAKE ONE (1) TABLET EACH DAY  . Misc Natural Products (OSTEO BI-FLEX JOINT SHIELD PO) Take by mouth 2 (two) times daily.  . Multiple Vitamin (MULTIVITAMIN) tablet  Take 1 tablet by mouth daily.  . pravastatin (PRAVACHOL) 20 MG tablet TAKE ONE (1) TABLET EACH DAY  . VITAMIN D, CHOLECALCIFEROL, PO Take 1,000 Units by mouth 2 (two) times daily.    No facility-administered encounter medications on file as of 11/23/2014.    Orders for this visit: Orders Placed This Encounter  Procedures  . CT Chest W Contrast    Standing Status: Future     Number of Occurrences:      Standing Expiration Date: 01/24/2016    Scheduling Instructions:     Please schedule close to 3 mos f/u.    Order Specific Question:  Reason for Exam (SYMPTOM  OR DIAGNOSIS REQUIRED)    Answer:  hiatal hernia    Order Specific Question:  Preferred imaging location?    Answer:  [facility]  . CT Abdomen Pelvis W Wo Contrast    This exam should ONLY be ordered for initial diagnosis or follow up of known pancreatic/liver/renal/bladder masses.    Standing Status: Future     Number of Occurrences:      Standing Expiration Date: 02/23/2016    Scheduling Instructions:     Please schedule close to 3 mos f/u.    Order Specific Question:  Reason for Exam (SYMPTOM  OR DIAGNOSIS REQUIRED)    Answer:  hiatal hernia    Order Specific Question:  Preferred imaging location?    Answer:  [facility]    Thank  you for the visitation and for allowing  [facility] to assist in the care of your patient. Our recommendations are noted above.  Please contact us if we can be of further service.  Vilinda Boehringer,  MD [facility] Office Number: [phone]

## 2014-11-23 NOTE — Assessment & Plan Note (Signed)
Patient with large hiatal hernia, which could be adding to her dyspnea Repeat CT chest\abdomen\pelvis

## 2014-11-23 NOTE — Patient Instructions (Signed)
Follow up with Dr. Stevenson Clinch in 3 months - cont with cpap - cont with diet and exercise - CT Chest for dyspnea and CT abdomen and pelvis for hiatal hernia, both without contrast, prior to follow up visit.

## 2014-11-23 NOTE — Assessment & Plan Note (Signed)
I believe that her dyspnea on exertion is multifactorial in nature due to factors such as: Deconditioning, obstructive sleep apnea, obesity, hiatal hernia  Normal PFTS, 89mwt with only 372 feet. Most likely deconditioning.  Plan  - continue with CPAP - repeat CT chest\abdomen and pelvis for further evaluation of dyspnea and hiatal hernia.  - exercise as tolerated.

## 2014-12-14 ENCOUNTER — Ambulatory Visit: Payer: Medicare Other | Admitting: Internal Medicine

## 2014-12-29 DIAGNOSIS — H16223 Keratoconjunctivitis sicca, not specified as Sjogren's, bilateral: Secondary | ICD-10-CM | POA: Diagnosis not present

## 2015-01-01 ENCOUNTER — Encounter: Payer: Self-pay | Admitting: Internal Medicine

## 2015-01-01 ENCOUNTER — Ambulatory Visit (INDEPENDENT_AMBULATORY_CARE_PROVIDER_SITE_OTHER): Payer: Medicare Other | Admitting: Internal Medicine

## 2015-01-01 VITALS — BP 163/69 | HR 71 | Temp 97.5°F | Resp 18 | Ht 60.0 in | Wt 176.0 lb

## 2015-01-01 DIAGNOSIS — Z86010 Personal history of colonic polyps: Secondary | ICD-10-CM

## 2015-01-01 DIAGNOSIS — R0609 Other forms of dyspnea: Secondary | ICD-10-CM

## 2015-01-01 DIAGNOSIS — R0600 Dyspnea, unspecified: Secondary | ICD-10-CM

## 2015-01-01 DIAGNOSIS — K219 Gastro-esophageal reflux disease without esophagitis: Secondary | ICD-10-CM | POA: Diagnosis not present

## 2015-01-01 DIAGNOSIS — G4733 Obstructive sleep apnea (adult) (pediatric): Secondary | ICD-10-CM

## 2015-01-01 DIAGNOSIS — Z23 Encounter for immunization: Secondary | ICD-10-CM | POA: Diagnosis not present

## 2015-01-01 DIAGNOSIS — E7800 Pure hypercholesterolemia, unspecified: Secondary | ICD-10-CM

## 2015-01-01 DIAGNOSIS — I10 Essential (primary) hypertension: Secondary | ICD-10-CM

## 2015-01-01 DIAGNOSIS — K449 Diaphragmatic hernia without obstruction or gangrene: Secondary | ICD-10-CM

## 2015-01-01 DIAGNOSIS — Z658 Other specified problems related to psychosocial circumstances: Secondary | ICD-10-CM

## 2015-01-01 DIAGNOSIS — F439 Reaction to severe stress, unspecified: Secondary | ICD-10-CM

## 2015-01-01 MED ORDER — ESOMEPRAZOLE MAGNESIUM 40 MG PO CPDR: 40.0000 mg | DELAYED_RELEASE_CAPSULE | Freq: Every day | ORAL | Status: DC

## 2015-01-01 NOTE — Progress Notes (Signed)
Pre-visit discussion using our clinic review tool. No additional management support is needed unless otherwise documented below in the visit note.  

## 2015-01-01 NOTE — Progress Notes (Signed)
Patient ID: April [name], female   DOB: [birth year], 74 y.o.   MRN: [id number]   Subjective:    Patient ID: April [name], female    DOB: February 06, 1941, 74 y.o.   MRN: [id number]  HPI  Patient here for a scheduled follow up.  Saw Dr Stevenson Clinch recently.  Planning for f/u CT scan 01/2015.  Has a large hiatal hernia.  Has noticed at times when she swallows - food will hang.  Has noticed with dry meat and bread.  Discussed with her today regarding possible GI referral for question of EGD.  She has noticed some acid reflux.  Will restart nexium.  Breathing is a little better.  States does not get as sob with exertion.  No chest pain or tightness.  No abdominal pain or cramping.  Bowels stable.  Some swelling in her feet and ankles.     Past Medical History  Diagnosis Date  . Hypertension   . Hypercholesterolemia   . History of colon polyps 08/1994  . Osteoporosis   . Sleep apnea     CPAP  . Arrhythmia     history of an irregular heart beat  . Diverticulitis   . Urine incontinence   . Rheumatic fever   . Migraines     history of migraines  . Endometriosis   . Hiatal hernia    Past Surgical History  Procedure Laterality Date  . Abdominal hysterectomy      ovaries not removed  . Tonsillectomy    . Breast biopsies      x2  . Appendectomy      was removed during hysterectomy  . Breast surgery Bilateral   . Coronary angioplasty  2012  . Colonoscopy  2013   Family History  Problem Relation Age of Onset  . Arthritis Mother   . Stroke Mother   . Hypertension Mother   . Osteoporosis Mother   . Heart disease Father     MI  . Stroke Brother   . Breast cancer      first cousin x 2  . Colon cancer Neg Hx    Social History   Social History  . Marital Status: Married    Spouse Name: N/A  . Number of Children: 2  . Years of Education: N/A   Social History Main Topics  . Smoking status: Never Smoker   . Smokeless tobacco: Never Used  . Alcohol Use: No  . Drug Use: No  . Sexual  Activity: Not Asked   Other Topics Concern  . None   Social History Narrative    Outpatient Encounter Prescriptions as of 01/01/2015  Medication Sig  . aspirin 81 MG tablet Take 81 mg by mouth daily.  . calcium carbonate (TUMS - DOSED IN MG ELEMENTAL CALCIUM) 500 MG chewable tablet Chew 1 tablet by mouth daily as needed.   . carvedilol (COREG) 25 MG tablet TAKE ONE TABLET TWICE DAILY WITH A MEAL  . fish oil-omega-3 fatty acids 1000 MG capsule Take 1 g by mouth daily.  . fluticasone (FLONASE) 50 MCG/ACT nasal spray Place 2 sprays into both nostrils daily.  Marland Kitchen losartan-hydrochlorothiazide (HYZAAR) 100-12.5 MG per tablet TAKE ONE (1) TABLET EACH DAY  . Misc Natural Products (OSTEO BI-FLEX JOINT SHIELD PO) Take by mouth 2 (two) times daily.  . Multiple Vitamin (MULTIVITAMIN) tablet Take 1 tablet by mouth daily.  . pravastatin (PRAVACHOL) 20 MG tablet TAKE ONE (1) TABLET EACH DAY  . VITAMIN D, CHOLECALCIFEROL, PO Take 1,000  Units by mouth 2 (two) times daily.   Marland Kitchen esomeprazole (NEXIUM) 40 MG capsule Take 1 capsule (40 mg total) by mouth daily.   No facility-administered encounter medications on file as of 01/01/2015.    Review of Systems  Constitutional: Negative for appetite change and unexpected weight change.  HENT: Negative for congestion and sinus pressure.   Eyes: Negative for discharge and visual disturbance.  Respiratory: Negative for cough, chest tightness and shortness of breath.   Cardiovascular: Negative for chest pain, palpitations and leg swelling.  Gastrointestinal: Negative for nausea, vomiting, abdominal pain and diarrhea.  Genitourinary: Negative for dysuria and difficulty urinating.  Musculoskeletal: Negative for joint swelling and neck pain.  Skin: Negative for color change and rash.  Neurological: Negative for dizziness, light-headedness and headaches.  Hematological: Negative for adenopathy. Does not bruise/bleed easily.  Psychiatric/Behavioral: Negative for dysphoric  mood and agitation.       Objective:   blood pressure rechecked by me:  140/72  Physical Exam  Constitutional: She appears well-developed and well-nourished. No distress.  HENT:  Nose: Nose normal.  Mouth/Throat: Oropharynx is clear and moist.  Eyes: Conjunctivae are normal. Right eye exhibits no discharge. Left eye exhibits no discharge.  Neck: Neck supple. No thyromegaly present.  Cardiovascular: Normal rate and regular rhythm.   Pulmonary/Chest: Breath sounds normal. No respiratory distress. She has no wheezes.  Abdominal: Soft. Bowel sounds are normal. There is no tenderness.  Musculoskeletal: She exhibits no edema or tenderness.  Lymphadenopathy:    She has no cervical adenopathy.  Skin: No rash noted. No erythema.  Psychiatric: She has a normal mood and affect. Her behavior is normal.    BP 163/69 mmHg  Pulse 71  Temp(Src) 97.5 F (36.4 C) (Oral)  Resp 18  Ht 5' (1.524 m)  Wt 176 lb (79.833 kg)  BMI 34.37 kg/m2  SpO2 98%  LMP 05/01/1972 Wt Readings from Last 3 Encounters:  01/01/15 176 lb (79.833 kg)  11/23/14 174 lb 6.4 oz (79.107 kg)  10/06/14 171 lb (77.565 kg)     Lab Results  Component Value Date   WBC 6.1 12/03/2013   HGB 12.7 12/03/2013   HCT 37.9 12/03/2013   PLT 225.0 12/03/2013   GLUCOSE 100* 04/28/2014   CHOL 200 04/28/2014   TRIG 78.0 04/28/2014   HDL 56.30 04/28/2014   LDLCALC 128* 04/28/2014   ALT 16 04/28/2014   AST 26 04/28/2014   NA 146* 04/28/2014   K 4.8 04/28/2014   CL 108 04/28/2014   CREATININE 0.8 04/28/2014   BUN 20 04/28/2014   CO2 31 04/28/2014   TSH 1.40 12/03/2013   HGBA1C 6.6* 04/28/2014       Assessment & Plan:   Problem List Items Addressed This Visit    DOE (dyspnea on exertion)    Seeing Dr Stevenson Clinch.  Planning for f/u CT.  Treat acid reflux.  Breathing is better.        Essential hypertension, benign    Blood pressure on recheck improved.  Continue same medication regimen.  Follow pressures.  Follow  metabolic panel.       GERD (gastroesophageal reflux disease)    Some acid reflux.  Restart nexium.  Further w/up pending results of scan.       Relevant Medications   esomeprazole (NEXIUM) 40 MG capsule   Hiatal hernia    Large hiatal hernia.  Seeing Dr Stevenson Clinch.  Planning for f/u chest CT 02/18/15.       History of colonic polyps  Colonoscopy 06/23/11 - diverticulosis otherwise normal.  Recommended f/u colonoscopy in five years.        Hypercholesterolemia    On pravastatin.  Low cholesterol diet and exercise.  Follow lipid panel and liver function tests.         Obstructive sleep apnea    CPAP.       Stress    Handling stress.  Does not feel needs any further intervention.  Follow.        Other Visit Diagnoses    Encounter for immunization    -  Primary        Einar Pheasant, MD

## 2015-01-03 ENCOUNTER — Encounter: Payer: Self-pay | Admitting: Internal Medicine

## 2015-01-03 NOTE — Assessment & Plan Note (Signed)
On pravastatin.  Low cholesterol diet and exercise.  Follow lipid panel and liver function tests.   

## 2015-01-03 NOTE — Assessment & Plan Note (Signed)
Handling stress.  Does not feel needs any further intervention.  Follow.   

## 2015-01-03 NOTE — Assessment & Plan Note (Signed)
Large hiatal hernia.  Seeing Dr Stevenson Clinch.  Planning for f/u chest CT 02/18/15.

## 2015-01-03 NOTE — Assessment & Plan Note (Signed)
Seeing Dr Stevenson Clinch.  Planning for f/u CT.  Treat acid reflux.  Breathing is better.

## 2015-01-03 NOTE — Assessment & Plan Note (Signed)
Blood pressure on recheck improved.  Continue same medication regimen.  Follow pressures.  Follow metabolic panel.   

## 2015-01-03 NOTE — Assessment & Plan Note (Addendum)
Some acid reflux.  Restart nexium.  Further w/up pending results of scan.

## 2015-01-03 NOTE — Assessment & Plan Note (Signed)
Colonoscopy 06/23/11 - diverticulosis otherwise normal.  Recommended f/u colonoscopy in five years.

## 2015-01-03 NOTE — Assessment & Plan Note (Signed)
CPAP.  

## 2015-01-29 DIAGNOSIS — H00025 Hordeolum internum left lower eyelid: Secondary | ICD-10-CM | POA: Diagnosis not present

## 2015-01-29 DIAGNOSIS — L03818 Cellulitis of other sites: Secondary | ICD-10-CM | POA: Diagnosis not present

## 2015-02-01 ENCOUNTER — Other Ambulatory Visit: Payer: Medicare Other

## 2015-02-01 DIAGNOSIS — H0015 Chalazion left lower eyelid: Secondary | ICD-10-CM | POA: Diagnosis not present

## 2015-02-05 ENCOUNTER — Other Ambulatory Visit (INDEPENDENT_AMBULATORY_CARE_PROVIDER_SITE_OTHER): Payer: Medicare Other

## 2015-02-05 ENCOUNTER — Other Ambulatory Visit: Payer: Self-pay | Admitting: Internal Medicine

## 2015-02-05 DIAGNOSIS — E7800 Pure hypercholesterolemia, unspecified: Secondary | ICD-10-CM | POA: Diagnosis not present

## 2015-02-05 DIAGNOSIS — R739 Hyperglycemia, unspecified: Secondary | ICD-10-CM

## 2015-02-05 DIAGNOSIS — K449 Diaphragmatic hernia without obstruction or gangrene: Secondary | ICD-10-CM | POA: Diagnosis not present

## 2015-02-05 DIAGNOSIS — I10 Essential (primary) hypertension: Secondary | ICD-10-CM | POA: Diagnosis not present

## 2015-02-05 LAB — BASIC METABOLIC PANEL
BUN: 13 mg/dL (ref 6–23)
CO2: 30 mEq/L (ref 19–32)
Calcium: 9.3 mg/dL (ref 8.4–10.5)
Chloride: 106 mEq/L (ref 96–112)
Creatinine, Ser: 0.81 mg/dL (ref 0.40–1.20)
GFR: 73.01 mL/min (ref >60.00)
Glucose, Bld: 104 mg/dL — ABNORMAL HIGH (ref 70–99)
POTASSIUM: 4.7 meq/L (ref 3.5–5.1)
SODIUM: 143 meq/L (ref 135–145)

## 2015-02-05 LAB — LIPID PANEL
CHOL/HDL RATIO: 3
Cholesterol: 193 mg/dL (ref 0–200)
HDL: 58.9 mg/dL (ref >39.00)
LDL Cholesterol: 117 mg/dL — ABNORMAL HIGH (ref 0–99)
NonHDL: 133.93
Triglycerides: 83 mg/dL (ref 0.0–149.0)
VLDL: 16.6 mg/dL (ref 0.0–40.0)

## 2015-02-05 LAB — CBC WITH DIFFERENTIAL/PLATELET
BASOS ABS: 0 10*3/uL (ref 0.0–0.1)
Basophils Relative: 0.4 % (ref 0.0–3.0)
EOS PCT: 3.6 % (ref 0.0–5.0)
Eosinophils Absolute: 0.2 10*3/uL (ref 0.0–0.7)
HEMATOCRIT: 34 % — AB (ref 36.0–46.0)
HEMOGLOBIN: 11.2 g/dL — AB (ref 12.0–15.0)
LYMPHS PCT: 39.3 % (ref 12.0–46.0)
Lymphs Abs: 2.6 10*3/uL (ref 0.7–4.0)
MCHC: 33 g/dL (ref 30.0–36.0)
MCV: 85.8 fl (ref 78.0–100.0)
MONOS PCT: 10.5 % (ref 3.0–12.0)
Monocytes Absolute: 0.7 10*3/uL (ref 0.1–1.0)
NEUTROS PCT: 46.2 % (ref 43.0–77.0)
Neutro Abs: 3.1 10*3/uL (ref 1.4–7.7)
Platelets: 238 10*3/uL (ref 150.0–400.0)
RBC: 3.96 Mil/uL (ref 3.87–5.11)
RDW: 14.2 % (ref 11.5–15.5)
WBC: 6.7 10*3/uL (ref 4.0–10.5)

## 2015-02-05 LAB — HEPATIC FUNCTION PANEL
ALT: 16 U/L (ref 0–35)
AST: 22 U/L (ref 0–37)
Albumin: 3.6 g/dL (ref 3.5–5.2)
Alkaline Phosphatase: 62 U/L (ref 39–117)
BILIRUBIN TOTAL: 0.5 mg/dL (ref 0.2–1.2)
Bilirubin, Direct: 0 mg/dL (ref 0.0–0.3)
TOTAL PROTEIN: 6.8 g/dL (ref 6.0–8.3)

## 2015-02-05 LAB — TSH: TSH: 1.53 u[IU]/mL (ref 0.35–4.50)

## 2015-02-05 LAB — HEMOGLOBIN A1C: HEMOGLOBIN A1C: 6.4 % (ref 4.6–6.5)

## 2015-02-06 ENCOUNTER — Other Ambulatory Visit: Payer: Self-pay | Admitting: Internal Medicine

## 2015-02-06 DIAGNOSIS — D649 Anemia, unspecified: Secondary | ICD-10-CM

## 2015-02-06 NOTE — Progress Notes (Signed)
Orders placed for f/u labs.  

## 2015-02-09 ENCOUNTER — Encounter: Payer: Self-pay | Admitting: *Deleted

## 2015-02-18 ENCOUNTER — Ambulatory Visit
Admission: RE | Admit: 2015-02-18 | Discharge: 2015-02-18 | Disposition: A | Discharge status: Home / Self Care | Payer: Medicare Other | Source: Ambulatory Visit | Attending: Internal Medicine | Admitting: Internal Medicine

## 2015-02-18 ENCOUNTER — Telehealth: Payer: Self-pay | Admitting: *Deleted

## 2015-02-18 DIAGNOSIS — R0602 Shortness of breath: Secondary | ICD-10-CM

## 2015-02-18 DIAGNOSIS — K5730 Diverticulosis of large intestine without perforation or abscess without bleeding: Secondary | ICD-10-CM | POA: Insufficient documentation

## 2015-02-18 DIAGNOSIS — K449 Diaphragmatic hernia without obstruction or gangrene: Secondary | ICD-10-CM

## 2015-02-18 DIAGNOSIS — R1011 Right upper quadrant pain: Secondary | ICD-10-CM | POA: Diagnosis not present

## 2015-02-18 MED ORDER — IOHEXOL 300 MG/ML  SOLN
100.0000 mL | Freq: Once | INTRAMUSCULAR | Status: AC | PRN
  Administered 2015-02-18: 100 mL via INTRAVENOUS

## 2015-02-18 NOTE — Telephone Encounter (Signed)
LMOM for pt to return call. 

## 2015-02-18 NOTE — Telephone Encounter (Signed)
-----   Message from Vilinda Boehringer, MD sent at 02/18/2015 11:59 AM EDT ----- Please inform patient of her CT results. No acute findings on the chest CT. Further details can be discussed at the follow up visit.   1. No acute abnormality on CT of the chest is seen. There is no evidence of central pulmonary embolism, and no infiltrate or effusion is noted. 2. Moderate cardiomegaly. 3. Moderate size hiatal hernia. 4. Rectosigmoid colon diverticula with diverticulosis. 5. No abdominal or pelvic mass or adenopathy is seen.  Thank you Dr. Stevenson Clinch

## 2015-02-19 NOTE — Telephone Encounter (Signed)
Called and spoke to pt. Informed her of the results and recs per VM. Pt verbalized understanding and denied any further questions or concerns at this time.

## 2015-02-23 ENCOUNTER — Other Ambulatory Visit (INDEPENDENT_AMBULATORY_CARE_PROVIDER_SITE_OTHER): Payer: Medicare Other

## 2015-02-23 DIAGNOSIS — D649 Anemia, unspecified: Secondary | ICD-10-CM

## 2015-02-23 LAB — IBC PANEL
Iron: 67 ug/dL (ref 42–145)
Saturation Ratios: 16.2 % — ABNORMAL LOW (ref 20.0–50.0)
TRANSFERRIN: 296 mg/dL (ref 212.0–360.0)

## 2015-02-23 LAB — FERRITIN: Ferritin: 8 ng/mL — ABNORMAL LOW (ref 10.0–291.0)

## 2015-02-23 LAB — CBC WITH DIFFERENTIAL/PLATELET
BASOS ABS: 0 10*3/uL (ref 0.0–0.1)
BASOS PCT: 0.5 % (ref 0.0–3.0)
EOS ABS: 0.2 10*3/uL (ref 0.0–0.7)
Eosinophils Relative: 3.7 % (ref 0.0–5.0)
HEMATOCRIT: 34.7 % — AB (ref 36.0–46.0)
HEMOGLOBIN: 11.2 g/dL — AB (ref 12.0–15.0)
LYMPHS PCT: 39.8 % (ref 12.0–46.0)
Lymphs Abs: 2.6 10*3/uL (ref 0.7–4.0)
MCHC: 32.3 g/dL (ref 30.0–36.0)
MCV: 87.7 fl (ref 78.0–100.0)
Monocytes Absolute: 0.8 10*3/uL (ref 0.1–1.0)
Monocytes Relative: 12.1 % — ABNORMAL HIGH (ref 3.0–12.0)
Neutro Abs: 2.9 10*3/uL (ref 1.4–7.7)
Neutrophils Relative %: 43.9 % (ref 43.0–77.0)
Platelets: 244 10*3/uL (ref 150.0–400.0)
RBC: 3.96 Mil/uL (ref 3.87–5.11)
RDW: 14.7 % (ref 11.5–15.5)
WBC: 6.5 10*3/uL (ref 4.0–10.5)

## 2015-02-24 ENCOUNTER — Other Ambulatory Visit: Payer: Self-pay | Admitting: Internal Medicine

## 2015-02-24 DIAGNOSIS — D509 Iron deficiency anemia, unspecified: Secondary | ICD-10-CM

## 2015-02-24 NOTE — Progress Notes (Signed)
Order placed for referral.  

## 2015-02-24 NOTE — Progress Notes (Signed)
Order placed for f/u cbc and ferritin.   

## 2015-03-08 ENCOUNTER — Ambulatory Visit (INDEPENDENT_AMBULATORY_CARE_PROVIDER_SITE_OTHER): Payer: Medicare Other | Admitting: General Surgery

## 2015-03-08 ENCOUNTER — Encounter: Payer: Self-pay | Admitting: General Surgery

## 2015-03-08 ENCOUNTER — Ambulatory Visit: Payer: Medicare Other | Admitting: General Surgery

## 2015-03-08 VITALS — BP 136/74 | HR 72 | Resp 14 | Ht 60.0 in | Wt 179.0 lb

## 2015-03-08 DIAGNOSIS — K219 Gastro-esophageal reflux disease without esophagitis: Secondary | ICD-10-CM

## 2015-03-08 NOTE — Progress Notes (Signed)
Patient ID: April [name], female   DOB: Feb 07, 1941, 74 y.o.   MRN: [id number]  Chief Complaint  Patient presents with  . Other    aemia    HPI April [name] is a 74 y.o. female here today for an evaluation of anemia/hiatal hernia. Patient had a CT scan done on 02/18/15. She has been experiencing shortness of breath, and finds it difficult to swallow certain foods. Occasionally after a meal she will have right lower abdominal pain. SOB has become worse, and she has been having frequent reflux symptoms - primarily fluid coming up causing burning. This happens both day and night. Took 30 day course of PPI, but has been off for about 1 month. Did not notice much relief with this and was concerned about side effects so d/c'd.  I have reviewed the history of present illness with the patient. HPI/  Past Medical History  Diagnosis Date  . Hypertension   . Hypercholesterolemia   . History of colon polyps 08/1994  . Osteoporosis   . Sleep apnea     CPAP  . Arrhythmia     history of an irregular heart beat  . Diverticulitis   . Urine incontinence   . Rheumatic fever   . Migraines     history of migraines  . Endometriosis   . Hiatal hernia     Past Surgical History  Procedure Laterality Date  . Abdominal hysterectomy      ovaries not removed  . Tonsillectomy    . Breast biopsies      x2  . Appendectomy      was removed during hysterectomy  . Breast surgery Bilateral   . Coronary angioplasty  2012  . Colonoscopy  2013    Family History  Problem Relation Age of Onset  . Arthritis Mother   . Stroke Mother   . Hypertension Mother   . Osteoporosis Mother   . Heart disease Father     MI  . Stroke Brother   . Breast cancer      first cousin x 2  . Colon cancer Neg Hx     Social History Social History  Substance Use Topics  . Smoking status: Never Smoker   . Smokeless tobacco: Never Used  . Alcohol Use: No    Allergies  Allergen Reactions  . Penicillins     Okay  to take amoxicillin   . Sulfa Antibiotics     Current Outpatient Prescriptions  Medication Sig Dispense Refill  . aspirin 81 MG tablet Take 81 mg by mouth daily.    . calcium carbonate (TUMS - DOSED IN MG ELEMENTAL CALCIUM) 500 MG chewable tablet Chew 1 tablet by mouth daily as needed.     . carvedilol (COREG) 25 MG tablet TAKE ONE TABLET TWICE DAILY WITH A MEAL 60 tablet 5  . Ferrous Sulfate Dried (FEOSOL) 200 (65 FE) MG TABS Take by mouth.    . fish oil-omega-3 fatty acids 1000 MG capsule Take 1 g by mouth daily.    . fluticasone (FLONASE) 50 MCG/ACT nasal spray Place 2 sprays into both nostrils daily.    Marland Kitchen losartan-hydrochlorothiazide (HYZAAR) 100-12.5 MG tablet TAKE ONE (1) TABLET EACH DAY 30 tablet 4  . Misc Natural Products (OSTEO BI-FLEX JOINT SHIELD PO) Take by mouth 2 (two) times daily.    . Multiple Vitamin (MULTIVITAMIN) tablet Take 1 tablet by mouth daily.    . pravastatin (PRAVACHOL) 20 MG tablet TAKE ONE (1) TABLET EACH DAY 30  tablet 4  . VITAMIN D, CHOLECALCIFEROL, PO Take 1,000 Units by mouth 2 (two) times daily.      No current facility-administered medications for this visit.    Review of Systems Review of Systems  Constitutional: Negative.   Respiratory: Positive for shortness of breath and wheezing.   Cardiovascular: Negative.     Blood pressure 136/74, pulse 72, resp. rate 14, height 5' (1.524 m), weight 179 lb (81.194 kg), last menstrual period 05/01/1972.  Physical Exam Physical Exam  Constitutional: She is oriented to person, place, and time. She appears well-developed and well-nourished.  Eyes: Conjunctivae are normal. No scleral icterus.  Neck: Neck supple. No thyromegaly present.  Cardiovascular: Normal rate, regular rhythm and normal heart sounds.   Pulmonary/Chest: Effort normal and breath sounds normal. She has no wheezes.  Abdominal: Soft. Bowel sounds are normal. There is no hepatomegaly.  Lymphadenopathy:    She has no cervical adenopathy.   Neurological: She is alert and oriented to person, place, and time.  Skin: Skin is warm and dry.    Data Reviewed Progress notes and CT chest/CT abdomen pelvis reviewed. Moderate hiatal hernia noted.  Assessment    Mild iron deficiency anemia - no frank bleeding noted, hx of diverticulosis with no recent issues - last colonoscopy in 2013. Recent labs show Hg of 11.2 and mild decrease in ferritin level.  Hiatal hernia - Moderate size, given recent increase in reflux symptoms and SOB, an upper endoscopy is reasonable at this time.     Plan    Recommended scheduling upper endoscopy to assess complications of reflux. Patient agreeable to this plan. Discussed with patient the benefit of going back on PPI therapy to decrease reflux symptoms. Follow up to be determined after procedure.     Patient has been scheduled for an upper endoscopy on 03-17-15 at [facility]. Patient has been asked to decrease 81 mg aspirin from twice a day to once a day starting one week prior to procedure.    PCP:  Derry Skill 03/08/2015, 11:43 AM

## 2015-03-08 NOTE — Patient Instructions (Addendum)
Esophagogastroduodenoscopy Esophagogastroduodenoscopy (EGD) is a procedure that is used to examine the lining of the esophagus, stomach, and first part of the small intestine (duodenum). A long, flexible, lighted tube with a camera attached (endoscope) is inserted down the throat to view these organs. This procedure is done to detect problems or abnormalities, such as inflammation, bleeding, ulcers, or growths, in order to treat them. The procedure lasts 5-20 minutes. It is usually an outpatient procedure, but it may need to be performed in a hospital in emergency cases. LET [facility] CARE PROVIDER KNOW ABOUT:  Any allergies you have.  All medicines you are taking, including vitamins, herbs, eye drops, creams, and over-the-counter medicines.  Previous problems you or members of your family have had with the use of anesthetics.  Any blood disorders you have.  Previous surgeries you have had.  Medical conditions you have. RISKS AND COMPLICATIONS Generally, this is a safe procedure. However, problems can occur and include:  Infection.  Bleeding.  Tearing (perforation) of the esophagus, stomach, or duodenum.  Difficulty breathing or not being able to breathe.  Excessive sweating.  Spasms of the larynx.  Slowed heartbeat.  Low blood pressure. BEFORE THE PROCEDURE  Do not eat or drink anything after midnight on the night before the procedure or as directed by your health care provider.  Do not take your regular medicines before the procedure if your health care provider asks you not to. Ask your health care provider about changing or stopping those medicines.  If you wear dentures, be prepared to remove them before the procedure.  Arrange for someone to drive you home after the procedure. PROCEDURE  A numbing medicine (local anesthetic) may be sprayed in your throat for comfort and to stop you from gagging or coughing.  You will have an IV tube inserted in a vein in your  hand or arm. You will receive medicines and fluids through this tube.  You will be given a medicine to relax you (sedative).  A pain reliever will be given through the IV tube.  A mouth guard may be placed in your mouth to protect your teeth and to keep you from biting on the endoscope.  You will be asked to lie on your left side.  The endoscope will be inserted down your throat and into your esophagus, stomach, and duodenum.  Air will be put through the endoscope to allow your health care provider to clearly view the lining of your esophagus.  The lining of your esophagus, stomach, and duodenum will be examined. During the exam, your health care provider may:  Remove tissue to be examined under a microscope (biopsy) for inflammation, infection, or other medical problems.  Remove growths.  Remove objects (foreign bodies) that are stuck.  Treat any bleeding with medicines or other devices that stop tissues from bleeding (hot cautery, clipping devices).  Widen (dilate) or stretch narrowed areas of your esophagus and stomach.  The endoscope will be withdrawn. AFTER THE PROCEDURE  You will be taken to a recovery area for observation. Your blood pressure, heart rate, breathing rate, and blood oxygen level will be monitored often until the medicines you were given have worn off.  Do not eat or drink anything until the numbing medicine has worn off and your gag reflex has returned. You may choke.  Your health care provider should be able to discuss his or her findings with you. It will take longer to discuss the test results if any biopsies were taken.  This information is not intended to replace advice given to you by your health care provider. Make sure you discuss any questions you have with your health care provider.   Document Released: 08/18/2004 Document Revised: 05/08/2014 Document Reviewed: 03/20/2012 Elsevier Interactive Patient Education [employer].  Patient has  been scheduled for an upper endoscopy on 03-17-15 at [facility]. Patient has been asked to decrease 81 mg aspirin from twice a day to once a day starting one week prior to procedure.

## 2015-03-16 ENCOUNTER — Encounter: Payer: Self-pay | Admitting: *Deleted

## 2015-03-17 ENCOUNTER — Ambulatory Visit
Admission: RE | Admit: 2015-03-17 | Discharge: 2015-03-17 | Disposition: A | Discharge status: Home / Self Care | Payer: Medicare Other | Source: Ambulatory Visit | Attending: General Surgery | Admitting: General Surgery

## 2015-03-17 ENCOUNTER — Encounter
Admission: RE | Disposition: A | Discharge status: Home / Self Care | Payer: Self-pay | Source: Ambulatory Visit | Attending: General Surgery

## 2015-03-17 ENCOUNTER — Encounter: Payer: Self-pay | Admitting: Anesthesiology

## 2015-03-17 ENCOUNTER — Ambulatory Visit: Payer: Medicare Other | Admitting: Certified Registered Nurse Anesthetist

## 2015-03-17 DIAGNOSIS — K3189 Other diseases of stomach and duodenum: Secondary | ICD-10-CM | POA: Diagnosis not present

## 2015-03-17 DIAGNOSIS — K449 Diaphragmatic hernia without obstruction or gangrene: Secondary | ICD-10-CM | POA: Insufficient documentation

## 2015-03-17 DIAGNOSIS — K319 Disease of stomach and duodenum, unspecified: Secondary | ICD-10-CM | POA: Insufficient documentation

## 2015-03-17 DIAGNOSIS — D509 Iron deficiency anemia, unspecified: Secondary | ICD-10-CM | POA: Diagnosis not present

## 2015-03-17 DIAGNOSIS — K2950 Unspecified chronic gastritis without bleeding: Secondary | ICD-10-CM | POA: Insufficient documentation

## 2015-03-17 DIAGNOSIS — F419 Anxiety disorder, unspecified: Secondary | ICD-10-CM | POA: Diagnosis not present

## 2015-03-17 DIAGNOSIS — G4730 Sleep apnea, unspecified: Secondary | ICD-10-CM | POA: Insufficient documentation

## 2015-03-17 DIAGNOSIS — G43909 Migraine, unspecified, not intractable, without status migrainosus: Secondary | ICD-10-CM | POA: Diagnosis not present

## 2015-03-17 DIAGNOSIS — Z880 Allergy status to penicillin: Secondary | ICD-10-CM | POA: Diagnosis not present

## 2015-03-17 DIAGNOSIS — R0602 Shortness of breath: Secondary | ICD-10-CM | POA: Insufficient documentation

## 2015-03-17 DIAGNOSIS — I10 Essential (primary) hypertension: Secondary | ICD-10-CM | POA: Insufficient documentation

## 2015-03-17 DIAGNOSIS — E7800 Pure hypercholesterolemia, unspecified: Secondary | ICD-10-CM | POA: Diagnosis not present

## 2015-03-17 DIAGNOSIS — M810 Age-related osteoporosis without current pathological fracture: Secondary | ICD-10-CM | POA: Insufficient documentation

## 2015-03-17 DIAGNOSIS — Z86010 Personal history of colonic polyps: Secondary | ICD-10-CM | POA: Insufficient documentation

## 2015-03-17 DIAGNOSIS — R1310 Dysphagia, unspecified: Secondary | ICD-10-CM | POA: Insufficient documentation

## 2015-03-17 DIAGNOSIS — K259 Gastric ulcer, unspecified as acute or chronic, without hemorrhage or perforation: Secondary | ICD-10-CM | POA: Diagnosis not present

## 2015-03-17 DIAGNOSIS — K219 Gastro-esophageal reflux disease without esophagitis: Secondary | ICD-10-CM | POA: Diagnosis not present

## 2015-03-17 DIAGNOSIS — Z7982 Long term (current) use of aspirin: Secondary | ICD-10-CM | POA: Diagnosis not present

## 2015-03-17 DIAGNOSIS — R1030 Lower abdominal pain, unspecified: Secondary | ICD-10-CM | POA: Diagnosis not present

## 2015-03-17 DIAGNOSIS — Z882 Allergy status to sulfonamides status: Secondary | ICD-10-CM | POA: Insufficient documentation

## 2015-03-17 DIAGNOSIS — Z79899 Other long term (current) drug therapy: Secondary | ICD-10-CM | POA: Diagnosis not present

## 2015-03-17 DIAGNOSIS — K2970 Gastritis, unspecified, without bleeding: Secondary | ICD-10-CM | POA: Diagnosis not present

## 2015-03-17 HISTORY — PX: ESOPHAGOGASTRODUODENOSCOPY (EGD) WITH PROPOFOL: SHX5813

## 2015-03-17 SURGERY — ESOPHAGOGASTRODUODENOSCOPY (EGD) WITH PROPOFOL
Anesthesia: General

## 2015-03-17 MED ORDER — PROPOFOL 10 MG/ML IV BOLUS
INTRAVENOUS | Status: DC | PRN
  Administered 2015-03-17: 10 mg via INTRAVENOUS
  Administered 2015-03-17: 20 mg via INTRAVENOUS

## 2015-03-17 MED ORDER — LIDOCAINE HCL (CARDIAC) 20 MG/ML IV SOLN
INTRAVENOUS | Status: DC | PRN
  Administered 2015-03-17: 60 mg via INTRAVENOUS

## 2015-03-17 MED ORDER — PROPOFOL 500 MG/50ML IV EMUL
INTRAVENOUS | Status: DC | PRN
  Administered 2015-03-17: 130 ug/kg/min via INTRAVENOUS

## 2015-03-17 MED ORDER — SODIUM CHLORIDE 0.9 % IV SOLN
INTRAVENOUS | Status: DC
  Administered 2015-03-17: 1000 mL via INTRAVENOUS

## 2015-03-17 NOTE — Interval H&P Note (Signed)
History and Physical Interval Note:  03/17/2015 8:57 AM  April [name]  has presented today for surgery, with the diagnosis of GERD  The various methods of treatment have been discussed with the patient and family. After consideration of risks, benefits and other options for treatment, the patient has consented to  Procedure(s): ESOPHAGOGASTRODUODENOSCOPY (EGD) WITH PROPOFOL (N/A) as a surgical intervention .  The patient's history has been reviewed, patient examined, no change in status, stable for surgery.  I have reviewed the patient's chart and labs.  Questions were answered to the patient's satisfaction.     Kattie Santoyo G

## 2015-03-17 NOTE — Anesthesia Procedure Notes (Signed)
Date/Time: 03/17/2015 9:05 AM Performed by: Johnna Acosta Pre-anesthesia Checklist: Patient identified, Emergency Drugs available, Patient being monitored and Suction available Patient Re-evaluated:Patient Re-evaluated prior to inductionOxygen Delivery Method: Nasal cannula

## 2015-03-17 NOTE — H&P (View-Only) (Signed)
Patient ID: April [name], female   DOB: 11-19-1940, 74 y.o.   MRN: [id number]  Chief Complaint  Patient presents with  . Other    aemia    HPI SOHEILA [name] is a 74 y.o. female here today for an evaluation of anemia/hiatal hernia. Patient had a CT scan done on 02/18/15. She has been experiencing shortness of breath, and finds it difficult to swallow certain foods. Occasionally after a meal she will have right lower abdominal pain. SOB has become worse, and she has been having frequent reflux symptoms - primarily fluid coming up causing burning. This happens both day and night. Took 30 day course of PPI, but has been off for about 1 month. Did not notice much relief with this and was concerned about side effects so d/c'd.  I have reviewed the history of present illness with the patient. HPI/  Past Medical History  Diagnosis Date  . Hypertension   . Hypercholesterolemia   . History of colon polyps 08/1994  . Osteoporosis   . Sleep apnea     CPAP  . Arrhythmia     history of an irregular heart beat  . Diverticulitis   . Urine incontinence   . Rheumatic fever   . Migraines     history of migraines  . Endometriosis   . Hiatal hernia     Past Surgical History  Procedure Laterality Date  . Abdominal hysterectomy      ovaries not removed  . Tonsillectomy    . Breast biopsies      x2  . Appendectomy      was removed during hysterectomy  . Breast surgery Bilateral   . Coronary angioplasty  2012  . Colonoscopy  2013    Family History  Problem Relation Age of Onset  . Arthritis Mother   . Stroke Mother   . Hypertension Mother   . Osteoporosis Mother   . Heart disease Father     MI  . Stroke Brother   . Breast cancer      first cousin x 2  . Colon cancer Neg Hx     Social History Social History  Substance Use Topics  . Smoking status: Never Smoker   . Smokeless tobacco: Never Used  . Alcohol Use: No    Allergies  Allergen Reactions  . Penicillins     Okay  to take amoxicillin   . Sulfa Antibiotics     Current Outpatient Prescriptions  Medication Sig Dispense Refill  . aspirin 81 MG tablet Take 81 mg by mouth daily.    . calcium carbonate (TUMS - DOSED IN MG ELEMENTAL CALCIUM) 500 MG chewable tablet Chew 1 tablet by mouth daily as needed.     . carvedilol (COREG) 25 MG tablet TAKE ONE TABLET TWICE DAILY WITH A MEAL 60 tablet 5  . Ferrous Sulfate Dried (FEOSOL) 200 (65 FE) MG TABS Take by mouth.    . fish oil-omega-3 fatty acids 1000 MG capsule Take 1 g by mouth daily.    . fluticasone (FLONASE) 50 MCG/ACT nasal spray Place 2 sprays into both nostrils daily.    Marland Kitchen losartan-hydrochlorothiazide (HYZAAR) 100-12.5 MG tablet TAKE ONE (1) TABLET EACH DAY 30 tablet 4  . Misc Natural Products (OSTEO BI-FLEX JOINT SHIELD PO) Take by mouth 2 (two) times daily.    . Multiple Vitamin (MULTIVITAMIN) tablet Take 1 tablet by mouth daily.    . pravastatin (PRAVACHOL) 20 MG tablet TAKE ONE (1) TABLET EACH DAY 30  tablet 4  . VITAMIN D, CHOLECALCIFEROL, PO Take 1,000 Units by mouth 2 (two) times daily.      No current facility-administered medications for this visit.    Review of Systems Review of Systems  Constitutional: Negative.   Respiratory: Positive for shortness of breath and wheezing.   Cardiovascular: Negative.     Blood pressure 136/74, pulse 72, resp. rate 14, height 5' (1.524 m), weight 179 lb (81.194 kg), last menstrual period 05/01/1972.  Physical Exam Physical Exam  Constitutional: She is oriented to person, place, and time. She appears well-developed and well-nourished.  Eyes: Conjunctivae are normal. No scleral icterus.  Neck: Neck supple. No thyromegaly present.  Cardiovascular: Normal rate, regular rhythm and normal heart sounds.   Pulmonary/Chest: Effort normal and breath sounds normal. She has no wheezes.  Abdominal: Soft. Bowel sounds are normal. There is no hepatomegaly.  Lymphadenopathy:    She has no cervical adenopathy.   Neurological: She is alert and oriented to person, place, and time.  Skin: Skin is warm and dry.    Data Reviewed Progress notes and CT chest/CT abdomen pelvis reviewed. Moderate hiatal hernia noted.  Assessment    Mild iron deficiency anemia - no frank bleeding noted, hx of diverticulosis with no recent issues - last colonoscopy in 2013. Recent labs show Hg of 11.2 and mild decrease in ferritin level.  Hiatal hernia - Moderate size, given recent increase in reflux symptoms and SOB, an upper endoscopy is reasonable at this time.     Plan    Recommended scheduling upper endoscopy to assess complications of reflux. Patient agreeable to this plan. Discussed with patient the benefit of going back on PPI therapy to decrease reflux symptoms. Follow up to be determined after procedure.     Patient has been scheduled for an upper endoscopy on 03-17-15 at [facility]. Patient has been asked to decrease 81 mg aspirin from twice a day to once a day starting one week prior to procedure.    PCP:  Derry Skill 03/08/2015, 11:43 AM

## 2015-03-17 NOTE — Op Note (Signed)
[facility] Patient Name: April [name] Procedure Date: 03/17/2015 9:04 AM MRN: [id number] Account #: [id number] Date of Birth: 1940/05/26 Admit Type: Outpatient Age: 74 Room: [facility] ENDO ROOM 1 Gender: Female Note Status: Finalized Procedure:         Upper GI endoscopy Indications:       Suspected gastro-esophageal reflux disease Providers:         Emanuella Nickle G. Jamal Collin, MD Referring MD:      Einar Pheasant, MD (Referring MD) Medicines:         General Anesthesia Complications:     No immediate complications. Procedure:         Pre-Anesthesia Assessment:                    - General anesthesia under the supervision of an                     anesthesiologist was determined to be medically necessary                     for this procedure based on review of the patient's                     medical history, medications, and prior anesthesia history.                    After obtaining informed consent, the endoscope was passed                     under direct vision. Throughout the procedure, the                     patient's blood pressure, pulse, and oxygen saturations                     were monitored continuously. The Endoscope was introduced                     through the mouth, and advanced to the second part of                     duodenum. The upper GI endoscopy was accomplished without                     difficulty. The patient tolerated the procedure well. Findings:      The examined duodenum was normal.      The stomach was normal.      A medium-sized hiatus hernia was present.      One localized medium-sized erosion was found in the cardia. Biopsies       were taken with a cold forceps for histology.      Scattered mild inflammation was found in the gastric antrum. Biopsies       were taken with a cold forceps for histology.      The examined esophagus was normal. Impression:        - Normal examined duodenum.                    -  Normal stomach.                    - Medium-sized hiatus hernia.                    - Erosive gastropathy. Biopsied.                    -  Gastritis. Biopsied.                    - Normal esophagus. Recommendation:    - Await pathology results. Procedure Code(s): --- Professional ---                    [id number], Esophagogastroduodenoscopy, flexible, transoral;                     with biopsy, single or multiple Diagnosis Code(s): --- Professional ---                    K44.9, Diaphragmatic hernia without obstruction or gangrene                    K31.89, Other diseases of stomach and duodenum                    K29.70, Gastritis, unspecified, without bleeding CPT copyright 2014 American Medical Association. All rights reserved. The codes documented in this report are preliminary and upon coder review may  be revised to meet current compliance requirements. Christene Lye, MD 03/17/2015 9:27:20 AM This report has been signed electronically. Number of Addenda: 0 Note Initiated On: 03/17/2015 9:04 AM      [facility]

## 2015-03-17 NOTE — Anesthesia Preprocedure Evaluation (Signed)
Anesthesia Evaluation  Patient identified by MRN, date of birth, ID band Patient awake    Reviewed: Allergy & Precautions, NPO status , Patient's Chart, lab work & pertinent test results, reviewed documented beta blocker date and time   Airway Mallampati: III  TM Distance: <3 FB Neck ROM: Full    Dental  (+) Caps   Pulmonary shortness of breath and with exertion, sleep apnea ,    Pulmonary exam normal breath sounds clear to auscultation       Cardiovascular hypertension, Pt. on medications and Pt. on home beta blockers + DOE  Normal cardiovascular exam     Neuro/Psych  Headaches, Anxiety negative psych ROS   GI/Hepatic Neg liver ROS, hiatal hernia, GERD  Medicated and Controlled,  Endo/Other  negative endocrine ROS  Renal/GU negative Renal ROS     Musculoskeletal negative musculoskeletal ROS (+)   Abdominal Normal abdominal exam  (+)   Peds  Hematology negative hematology ROS (+)   Anesthesia Other Findings   Reproductive/Obstetrics                             Anesthesia Physical Anesthesia Plan  ASA: III  Anesthesia Plan: General   Post-op Pain Management:    Induction: Intravenous  Airway Management Planned: Nasal Cannula  Additional Equipment:   Intra-op Plan:   Post-operative Plan:   Informed Consent: I have reviewed the patients History and Physical, chart, labs and discussed the procedure including the risks, benefits and alternatives for the proposed anesthesia with the patient or authorized representative who has indicated his/her understanding and acceptance.   Dental advisory given  Plan Discussed with: CRNA and Surgeon  Anesthesia Plan Comments:         Anesthesia Quick Evaluation

## 2015-03-17 NOTE — Transfer of Care (Signed)
Immediate Anesthesia Transfer of Care Note  Patient: April [name]  Procedure(s) Performed: Procedure(s): ESOPHAGOGASTRODUODENOSCOPY (EGD) WITH PROPOFOL (N/A)  Patient Location: PACU  Anesthesia Type:General  Level of Consciousness: sedated  Airway & Oxygen Therapy: Patient Spontanous Breathing and Patient connected to nasal cannula oxygen  Post-op Assessment: Report given to RN and Post -op Vital signs reviewed and stable  Post vital signs: Reviewed and stable  Last Vitals:  Filed Vitals:   03/17/15 0925  BP: 121/65  Pulse: 72  Temp: 36 C  Resp: 18    Complications: No apparent anesthesia complications

## 2015-03-18 ENCOUNTER — Encounter: Payer: Self-pay | Admitting: General Surgery

## 2015-03-18 NOTE — Anesthesia Postprocedure Evaluation (Signed)
  Anesthesia Post-op Note  Patient: April [name]  Procedure(s) Performed: Procedure(s): ESOPHAGOGASTRODUODENOSCOPY (EGD) WITH PROPOFOL (N/A)  Anesthesia type:General  Patient location: PACU  Post pain: Pain level controlled  Post assessment: Post-op Vital signs reviewed, Patient's Cardiovascular Status Stable, Respiratory Function Stable, Patent Airway and No signs of Nausea or vomiting  Post vital signs: Reviewed and stable  Last Vitals:  Filed Vitals:   03/17/15 1010  BP: 125/77  Pulse: 50  Temp:   Resp: 22    Level of consciousness: awake, alert  and patient cooperative  Complications: No apparent anesthesia complications

## 2015-03-19 LAB — SURGICAL PATHOLOGY

## 2015-04-01 ENCOUNTER — Ambulatory Visit (INDEPENDENT_AMBULATORY_CARE_PROVIDER_SITE_OTHER): Payer: Medicare Other | Admitting: Internal Medicine

## 2015-04-01 ENCOUNTER — Encounter: Payer: Self-pay | Admitting: Internal Medicine

## 2015-04-01 ENCOUNTER — Other Ambulatory Visit (INDEPENDENT_AMBULATORY_CARE_PROVIDER_SITE_OTHER): Payer: Medicare Other

## 2015-04-01 VITALS — BP 120/60 | HR 79 | Ht 60.0 in | Wt 180.2 lb

## 2015-04-01 DIAGNOSIS — G4733 Obstructive sleep apnea (adult) (pediatric): Secondary | ICD-10-CM

## 2015-04-01 DIAGNOSIS — R079 Chest pain, unspecified: Secondary | ICD-10-CM | POA: Insufficient documentation

## 2015-04-01 DIAGNOSIS — R0789 Other chest pain: Secondary | ICD-10-CM | POA: Diagnosis not present

## 2015-04-01 DIAGNOSIS — R0602 Shortness of breath: Secondary | ICD-10-CM | POA: Diagnosis not present

## 2015-04-01 DIAGNOSIS — K2970 Gastritis, unspecified, without bleeding: Secondary | ICD-10-CM | POA: Diagnosis not present

## 2015-04-01 DIAGNOSIS — D509 Iron deficiency anemia, unspecified: Secondary | ICD-10-CM | POA: Diagnosis not present

## 2015-04-01 LAB — CBC WITH DIFFERENTIAL/PLATELET
BASOS PCT: 0.5 % (ref 0.0–3.0)
Basophils Absolute: 0 10*3/uL (ref 0.0–0.1)
EOS ABS: 0.2 10*3/uL (ref 0.0–0.7)
Eosinophils Relative: 2.9 % (ref 0.0–5.0)
HCT: 35.9 % — ABNORMAL LOW (ref 36.0–46.0)
HEMOGLOBIN: 11.7 g/dL — AB (ref 12.0–15.0)
LYMPHS ABS: 2.4 10*3/uL (ref 0.7–4.0)
Lymphocytes Relative: 33.2 % (ref 12.0–46.0)
MCHC: 32.5 g/dL (ref 30.0–36.0)
MCV: 88.8 fl (ref 78.0–100.0)
MONO ABS: 0.6 10*3/uL (ref 0.1–1.0)
Monocytes Relative: 8.1 % (ref 3.0–12.0)
NEUTROS ABS: 4 10*3/uL (ref 1.4–7.7)
NEUTROS PCT: 55.3 % (ref 43.0–77.0)
Platelets: 241 10*3/uL (ref 150.0–400.0)
RBC: 4.05 Mil/uL (ref 3.87–5.11)
RDW: 15.6 % — AB (ref 11.5–15.5)
WBC: 7.2 10*3/uL (ref 4.0–10.5)

## 2015-04-01 LAB — FERRITIN: Ferritin: 23.6 ng/mL (ref 10.0–291.0)

## 2015-04-01 MED ORDER — FLUTICASONE FUROATE-VILANTEROL 100-25 MCG/INH IN AEPB: 1.0000 | INHALATION_SPRAY | Freq: Every day | RESPIRATORY_TRACT | Status: AC

## 2015-04-01 NOTE — Assessment & Plan Note (Signed)
Currently on Nexium daily, 40 mg by mouth. Patient educated on proper use and time of taking Nexium. Recent endoscopy with biopsy showed continued inflammation. She might need adjustment of her proton pump inhibitor.  Plan: -Continue with Nexium 40 mg daily, primary care physician to address readjusting her PPI.

## 2015-04-01 NOTE — Progress Notes (Signed)
MRN# [id number] April [name] 03-May-1940   CC: Chief Complaint  Patient presents with  . Follow-up    pt. states no improvement with SOB & DOE. dry cough. occ. wheezing. occ. chest pain.      Brief History: HPI 06/2014 HPI:  Patient is a pleasant 74 year old female presents today for further evaluation of shortness of breath is becoming worse over the past one year. Patient states that she also has dyspnea on exertion, walking from the parking lot to the office today elicited dyspnea, she also states that she cannot climb 1 flight of stairs. Over the last year for shortness of breath has been gradual in nature, she describes the sensation as "winded" and being fatigued. She has a history of a hiatal hernia. Patient was also seen by cardiology, Dr. Candis Musa, workup included cardiac catheterization, which was read as normal. Patient says she has intermittent cough at times which is nonproductive. She cannot identify any factors and the last year that may have caused worsening shortness of breath or any relation to allergies or environmental changes. She has a history of obstructive sleep apnea for which she wears BiPAP nightly since 2012; however over the past 2-3 weeks she has not worn her BiPAP due to having nasal congestion and suspected sinusitis. Patient is a never smoker, but does endorse exposure to secondhand smoke when she lived at home up until the age of 74 years old. Plan - pfts, 105mwt, split night study  ROV 07/2014 Patient presents for a follow up visit today. Had sleep titration study done along with pfts and 57mwt. Currently wearing cpap 5/7 nights.  No worsening sob since her last visit, no recent ED\Urgent care visits.  Plan - cont with cpap, severe hiatal hernia, referral to surgery for large hiatal hernia, cont with exercise as tolerated. Has normal pfts (no obs process).    ROV 10/2014: Patient presents today for follow-up visit of dyspnea on exertion. Since her last  visit she's had a sleep study done, she is wearing her CPAP machine nightly. Overall she is not as tired as she used to be before, but her dyspnea on exertion has not had any significant improvement. She still has a nasal congestion. At her last visit it was determined that one of the sources of dyspnea could be related to her hiatal hernia, she has follow-up with Dr. Jamal Collin who is providing monitoring at this time. Plan: Normal PFTS, 24mwt with only 372 feet. Most likely deconditioning.  Plan  - continue with CPAP - repeat CT chest\abdomen and pelvis for further evaluation of dyspnea and hiatal hernia.  - exercise as tolerated.   Events last clinic visit: Patient presents today for follow-up visit of chronic dyspnea on exertion, chronic dry nonproductive cough and chest pain. Since her last visit she's had endoscopy by Dr. Jamal Collin, biopsy report from the endoscopy showed gastritis no active H. pylori infection no metaplasia. Patient still states that she is short of breath with exertion, she still has moderate nasal congestion but no nasal drainage. She did have 2 cardiac catheterizations in the past most recently being about 3 years ago, she was seen by Dr. Lorinda Creed, but is not requesting to see Dr. Candis Musa with the [facility] cardiology.  Patient also stated today that she's having intermittent episodes of mild sharp chest pain, this is not new, she's had again to prior cardiac catheterizations in the past that showed coronary artery disease not requiring acute intervention.   Medication:   Current Outpatient Rx  Name  Route  Sig  Dispense  Refill  . aspirin 81 MG tablet   Oral   Take 81 mg by mouth daily.         . calcium carbonate (TUMS - DOSED IN MG ELEMENTAL CALCIUM) 500 MG chewable tablet   Oral   Chew 1 tablet by mouth daily as needed.          . carvedilol (COREG) 25 MG tablet      TAKE ONE TABLET TWICE DAILY WITH A MEAL   60 tablet   5   . esomeprazole (NEXIUM) 40 MG  capsule            2   . Ferrous Sulfate Dried (FEOSOL) 200 (65 FE) MG TABS   Oral   Take by mouth.         . fish oil-omega-3 fatty acids 1000 MG capsule   Oral   Take 1 g by mouth daily.         . fluticasone (FLONASE) 50 MCG/ACT nasal spray   Each Nare   Place 2 sprays into both nostrils daily.         Marland Kitchen losartan-hydrochlorothiazide (HYZAAR) 100-12.5 MG tablet      TAKE ONE (1) TABLET EACH DAY   30 tablet   4   . Misc Natural Products (OSTEO BI-FLEX JOINT SHIELD PO)   Oral   Take by mouth 2 (two) times daily.         . Multiple Vitamin (MULTIVITAMIN) tablet   Oral   Take 1 tablet by mouth daily.         . pravastatin (PRAVACHOL) 20 MG tablet      TAKE ONE (1) TABLET EACH DAY   30 tablet   4   . VITAMIN D, CHOLECALCIFEROL, PO   Oral   Take 1,000 Units by mouth 2 (two) times daily.          . Fluticasone Furoate-Vilanterol (BREO ELLIPTA) 100-25 MCG/INH AEPB   Inhalation   Inhale 1 puff into the lungs daily.   14 each   0      Review of Systems: Gen:  Denies  fever, sweats, chills HEENT: Denies blurred vision, double vision, ear pain, eye pain, hearing loss, nose bleeds, sore throat Cvc:  No dizziness, mild intermittent sharp left chest pain at rest.  Resp:   Admits to:sob, doe, chest pain at rest.  Gi: Denies swallowing difficulty, stomach pain, nausea or vomiting, diarrhea, constipation, bowel incontinence Gu:  Denies bladder incontinence, burning urine Ext:   No Joint pain, stiffness or swelling Skin: No skin rash, easy bruising or bleeding or hives Endoc:  No polyuria, polydipsia , polyphagia or weight change Other:  All other systems negative  Allergies:  Penicillins and Sulfa antibiotics  Physical Examination:  VS: BP 120/60 mmHg  Pulse 79  Ht 5' (1.524 m)  Wt 180 lb 3.2 oz (81.738 kg)  BMI 35.19 kg/m2  SpO2 96%  LMP 05/01/1972  General Appearance: No distress  HEENT: PERRLA, no ptosis, no other lesions  noticed Pulmonary:normal breath sounds., diaphragmatic excursion normal.No wheezing, No rales   Cardiovascular:  Normal S1,S2.  No m/r/g.     Abdomen:Exam: Benign, Soft, non-tender, No masses  Skin:   warm, no rashes, no ecchymosis  Extremities: normal, no cyanosis, clubbing, warm with normal capillary refill.      Rad results: (The following images and results were reviewed by Dr. Stevenson Clinch). ECHO 10/2014 Study Conclusions  - Left ventricle: The  cavity size was normal. Systolic function was normal. The estimated ejection fraction was in the range of 55% to 65%. Wall motion was normal; there were no regional wall motion abnormalities. Doppler parameters are consistent with abnormal left ventricular relaxation (grade 1 diastolic dysfunction). - Mitral valve: There was mild regurgitation. - Left atrium: The atrium was normal in size. - Right ventricle: Systolic function was normal. - Pulmonary arteries: Systolic pressure was within the high normal range. PA peak pressure: 32 mm Hg (S).  CT Chest/A/P 01/2015 IMPRESSION: 1. No acute abnormality on CT of the chest is seen. There is no evidence of central pulmonary embolism, and no infiltrate or effusion is noted. 2. Moderate cardiomegaly. 3. Moderate size hiatal hernia. 4. Rectosigmoid colon diverticula with diverticulosis. 5. No abdominal or pelvic mass or adenopathy is seen.     Assessment and Plan: 74 year old female seen in follow-up for dyspnea on exertion SOB (shortness of breath) I believe that her dyspnea on exertion is multifactorial in nature due to factors such as: Deconditioning, obstructive sleep apnea, obesity, hiatal hernia, coronary artery disease, cardiac megaly, nasal congestion/allergies  Normal PFTS, 79mwt with only 372 feet.   Patient still with dyspnea on exertion/shortness of breath at rest. CAT scan of chest did not any acute lung abnormality that could explain her shortness of breath, she does  have nasal congestion without drainage. She has seen ENT in the past and I have recommended that she follow-up with ENT again to address nasal congestion. Patient with history of coronary artery disease, cardiomegaly, hypertension, history of cardiac catheterization, at this time given her chest pain and level of dyspnea on exertion I recommended continued follow-up with cardiology. Per patient request, she would like to follow with Dr. Ida Rogue I have also reviewed her medications, she is currently on a ARB/diuretic-the incidence of cough is very low with this type of medication, however given her continued cough and dyspnea on exertion I have spoken with her PMD to readjust her blood pressure medications  Plan  - continue with CPAP - exercise as tolerated.  - ENT F\U - Cardiology Referral - Breo 2 week trial - patient to call office for rx if needed.         Gastritis Currently on Nexium daily, 40 mg by mouth. Patient educated on proper use and time of taking Nexium. Recent endoscopy with biopsy showed continued inflammation. She might need adjustment of her proton pump inhibitor.  Plan: -Continue with Nexium 40 mg daily, primary care physician to address readjusting her PPI.  Chest pain Differential diagnosis-coronary artery disease/cardiac megaly/chronic chest pain/vasospasms/bronchospasm  Chest pain sharp, intermittent at times, can occur at rest. This is been a chronic issue for her, however over the last few months is been increasing in frequency. Does have a coronary artery disease history status post cardiac catheterization 2 times in the recent past. Recent CAT scan with cardiomegaly (moderate). Patient has requested to follow-up with [facility] Cardiology - Dr. Rockey Situ  Plan - continue with close monitoring, BP control - F/U per patient request with Dr. Rockey Situ  Obstructive sleep apnea CPAP titration study results showed optimal pressure of 11cmH20  Plan: -CPAP  11cmH2O - cont with nightly use.        Updated Medication List Outpatient Encounter Prescriptions as of 04/01/2015  Medication Sig  . aspirin 81 MG tablet Take 81 mg by mouth daily.  . calcium carbonate (TUMS - DOSED IN MG ELEMENTAL CALCIUM) 500 MG chewable tablet Chew 1 tablet by mouth  daily as needed.   . carvedilol (COREG) 25 MG tablet TAKE ONE TABLET TWICE DAILY WITH A MEAL  . esomeprazole (NEXIUM) 40 MG capsule   . Ferrous Sulfate Dried (FEOSOL) 200 (65 FE) MG TABS Take by mouth.  . fish oil-omega-3 fatty acids 1000 MG capsule Take 1 g by mouth daily.  . fluticasone (FLONASE) 50 MCG/ACT nasal spray Place 2 sprays into both nostrils daily.  Marland Kitchen losartan-hydrochlorothiazide (HYZAAR) 100-12.5 MG tablet TAKE ONE (1) TABLET EACH DAY  . Misc Natural Products (OSTEO BI-FLEX JOINT SHIELD PO) Take by mouth 2 (two) times daily.  . Multiple Vitamin (MULTIVITAMIN) tablet Take 1 tablet by mouth daily.  . pravastatin (PRAVACHOL) 20 MG tablet TAKE ONE (1) TABLET EACH DAY  . VITAMIN D, CHOLECALCIFEROL, PO Take 1,000 Units by mouth 2 (two) times daily.   . Fluticasone Furoate-Vilanterol (BREO ELLIPTA) 100-25 MCG/INH AEPB Inhale 1 puff into the lungs daily.   No facility-administered encounter medications on file as of 04/01/2015.    Orders for this visit: Orders Placed This Encounter  Procedures  . Ambulatory referral to Cardiology    Referral Priority:  Routine    Referral Type:  Consultation    Referral Reason:  Specialty Services Required    Referred to Provider:  Minna Merritts, MD    Requested Specialty:  Cardiology    Number of Visits Requested:  1    Thank  you for the visitation and for allowing  [facility] to assist in the care of your patient. Our recommendations are noted above.  Please contact us if we can be of further service.  Vilinda Boehringer, MD [facility] Office Number: [phone]

## 2015-04-01 NOTE — Patient Instructions (Signed)
Follow up with Dr. Stevenson Clinch in: 3 months - Breo 2 week trial - call us in 7-10day and give [place] an update, if improvement in symptoms then we will start you on a prescription - f/u with your ENT doctor (Dr. Pryor Ochoa)  - as per your request we will refer your to Dr. Rockey Situ for shortness of breath and history of CAD with cardiomegaly - we will send a copy of our note to Dr. Nicki Reaper - she will address your BP med and Nexium usage.

## 2015-04-01 NOTE — Assessment & Plan Note (Signed)
I believe that her dyspnea on exertion is multifactorial in nature due to factors such as: Deconditioning, obstructive sleep apnea, obesity, hiatal hernia, coronary artery disease, cardiac megaly, nasal congestion/allergies  Normal PFTS, 88mwt with only 372 feet.   Patient still with dyspnea on exertion/shortness of breath at rest. CAT scan of chest did not any acute lung abnormality that could explain her shortness of breath, she does have nasal congestion without drainage. She has seen ENT in the past and I have recommended that she follow-up with ENT again to address nasal congestion. Patient with history of coronary artery disease, cardiomegaly, hypertension, history of cardiac catheterization, at this time given her chest pain and level of dyspnea on exertion I recommended continued follow-up with cardiology. Per patient request, she would like to follow with Dr. Ida Rogue I have also reviewed her medications, she is currently on a ARB/diuretic-the incidence of cough is very low with this type of medication, however given her continued cough and dyspnea on exertion I have spoken with her PMD to readjust her blood pressure medications  Plan  - continue with CPAP - exercise as tolerated.  - ENT F\U - Cardiology Referral - Breo 2 week trial - patient to call office for rx if needed.

## 2015-04-01 NOTE — Assessment & Plan Note (Signed)
CPAP titration study results showed optimal pressure of 11cmH20  Plan: -CPAP 11cmH2O - cont with nightly use.

## 2015-04-01 NOTE — Assessment & Plan Note (Signed)
Differential diagnosis-coronary artery disease/cardiac megaly/chronic chest pain/vasospasms/bronchospasm  Chest pain sharp, intermittent at times, can occur at rest. This is been a chronic issue for her, however over the last few months is been increasing in frequency. Does have a coronary artery disease history status post cardiac catheterization 2 times in the recent past. Recent CAT scan with cardiomegaly (moderate). Patient has requested to follow-up with [facility] Cardiology - Dr. Rockey Situ  Plan - continue with close monitoring, BP control - F/U per patient request with Dr. Rockey Situ

## 2015-04-02 ENCOUNTER — Encounter: Payer: Self-pay | Admitting: *Deleted

## 2015-04-07 ENCOUNTER — Other Ambulatory Visit: Payer: Self-pay

## 2015-04-07 MED ORDER — ESOMEPRAZOLE MAGNESIUM 40 MG PO CPDR: 40.0000 mg | DELAYED_RELEASE_CAPSULE | Freq: Two times a day (BID) | ORAL | Status: DC

## 2015-04-07 NOTE — Telephone Encounter (Signed)
Per the patient, Dr. Stevenson Clinch increased the dose to BID.  Please advise?

## 2015-04-07 NOTE — Telephone Encounter (Signed)
rx sent in for bid nexium.  Please also ask pt if she needs an earlier appt to discuss her blood pressure medication -  Per Dr Stevenson Clinch.  Thanks

## 2015-04-09 ENCOUNTER — Encounter: Payer: Medicare Other | Admitting: Internal Medicine

## 2015-04-13 ENCOUNTER — Telehealth: Payer: Self-pay | Admitting: Internal Medicine

## 2015-04-13 ENCOUNTER — Other Ambulatory Visit: Payer: Self-pay | Admitting: *Deleted

## 2015-04-13 MED ORDER — FLUTICASONE FUROATE-VILANTEROL 100-25 MCG/INH IN AEPB: 1.0000 | INHALATION_SPRAY | Freq: Every day | RESPIRATORY_TRACT | Status: DC

## 2015-04-13 NOTE — Telephone Encounter (Signed)
FYI, I had called her regarding your message on a refill request, she will discuss the medication with you at her appointment in January.

## 2015-04-13 NOTE — Telephone Encounter (Signed)
Patient left a voice mail . Stating April [name] called about BP medication . She has and appointment in January with Dr. Nicki Reaper she stated she will wait to talk with Dr. Nicki Reaper in January about her medication . If you are needing to call the patient call her on her cell.

## 2015-04-15 ENCOUNTER — Telehealth: Payer: Self-pay | Admitting: Internal Medicine

## 2015-04-15 ENCOUNTER — Other Ambulatory Visit: Payer: Self-pay | Admitting: *Deleted

## 2015-04-15 DIAGNOSIS — Z1231 Encounter for screening mammogram for malignant neoplasm of breast: Secondary | ICD-10-CM

## 2015-04-15 NOTE — Telephone Encounter (Signed)
Pt called. Her esomeprazole (NEXIUM) 40 MG capsule was just filled. It was only filled for 30 day supply, pt needs a 60 day. Pharmacy will be faxing paper work to  Dr. Nicki Reaper. Pt just wanted you to know this.  Thank you.

## 2015-05-06 ENCOUNTER — Telehealth: Payer: Self-pay | Admitting: *Deleted

## 2015-05-06 NOTE — Telephone Encounter (Signed)
PA form for Nexium completed & faxed to [place] decision. Pt states she has tried Nexium & Prilosec OTC with little relief.

## 2015-05-07 ENCOUNTER — Other Ambulatory Visit: Payer: Self-pay | Admitting: Internal Medicine

## 2015-05-07 NOTE — Telephone Encounter (Signed)
Medication was approved for one year from 05/06/2015.  Thanks

## 2015-05-13 DIAGNOSIS — M170 Bilateral primary osteoarthritis of knee: Secondary | ICD-10-CM | POA: Diagnosis not present

## 2015-05-14 ENCOUNTER — Encounter: Payer: Self-pay | Admitting: Internal Medicine

## 2015-05-14 ENCOUNTER — Ambulatory Visit (INDEPENDENT_AMBULATORY_CARE_PROVIDER_SITE_OTHER): Payer: Medicare Other | Admitting: Internal Medicine

## 2015-05-14 VITALS — BP 110/80 | HR 68 | Temp 98.2°F | Resp 18 | Ht 60.0 in | Wt 179.1 lb

## 2015-05-14 DIAGNOSIS — Z0000 Encounter for general adult medical examination without abnormal findings: Secondary | ICD-10-CM

## 2015-05-14 DIAGNOSIS — E119 Type 2 diabetes mellitus without complications: Secondary | ICD-10-CM

## 2015-05-14 DIAGNOSIS — K449 Diaphragmatic hernia without obstruction or gangrene: Secondary | ICD-10-CM | POA: Diagnosis not present

## 2015-05-14 DIAGNOSIS — Z860100 Personal history of colon polyps, unspecified: Secondary | ICD-10-CM

## 2015-05-14 DIAGNOSIS — R0600 Dyspnea, unspecified: Secondary | ICD-10-CM

## 2015-05-14 DIAGNOSIS — I10 Essential (primary) hypertension: Secondary | ICD-10-CM

## 2015-05-14 DIAGNOSIS — R0609 Other forms of dyspnea: Secondary | ICD-10-CM

## 2015-05-14 DIAGNOSIS — G4733 Obstructive sleep apnea (adult) (pediatric): Secondary | ICD-10-CM

## 2015-05-14 DIAGNOSIS — R0602 Shortness of breath: Secondary | ICD-10-CM

## 2015-05-14 DIAGNOSIS — E669 Obesity, unspecified: Secondary | ICD-10-CM

## 2015-05-14 DIAGNOSIS — D649 Anemia, unspecified: Secondary | ICD-10-CM

## 2015-05-14 DIAGNOSIS — K219 Gastro-esophageal reflux disease without esophagitis: Secondary | ICD-10-CM

## 2015-05-14 DIAGNOSIS — Z658 Other specified problems related to psychosocial circumstances: Secondary | ICD-10-CM

## 2015-05-14 DIAGNOSIS — E7800 Pure hypercholesterolemia, unspecified: Secondary | ICD-10-CM

## 2015-05-14 DIAGNOSIS — F439 Reaction to severe stress, unspecified: Secondary | ICD-10-CM

## 2015-05-14 NOTE — Progress Notes (Signed)
Pre-visit discussion using our clinic review tool. No additional management support is needed unless otherwise documented below in the visit note.  

## 2015-05-14 NOTE — Progress Notes (Signed)
Patient ID: April [name], female   DOB: [birth year], 75 y.o.   MRN: [id number]   Subjective:    Patient ID: April [name], female    DOB: 11/23/1940, 75 y.o.   MRN: [id number]  HPI  Patient with past history of hypercholesterolemia, sleep apnea, hypercholesterolemia, hiatal hernia and sob.  She comes in today to follow up on these issues as well as for her physical exam.  She has had increased cough and congestion recently.  Sees Dr Pryor Ochoa.  Had levaquin.  Taking for current symptoms.  Just finished today.  Cough and congestion better.  Prior to getting sick, she felt her sob was better and the cough some better.  She feels she can take a better deep breath.  Still gets sob with inclines.  Saw pulmonary (Dr Melina Schools).  On a new inhaler.  Feels this is helping.  Discussed changing her blood pressure medication.  She wants to hold on changing at this time.  Is taking nexium twice a day and feels this has helped as well. Planning to f/u with ENT.  Increased stress with settling the estate - husband passed.  Overall feels she is handling things relatively well.     Past Medical History  Diagnosis Date  . Hypertension   . Hypercholesterolemia   . History of colon polyps 08/1994  . Osteoporosis   . Sleep apnea     CPAP  . Arrhythmia     history of an irregular heart beat  . Diverticulitis   . Urine incontinence   . Rheumatic fever   . Migraines     history of migraines  . Endometriosis   . Hiatal hernia    Past Surgical History  Procedure Laterality Date  . Abdominal hysterectomy      ovaries not removed  . Tonsillectomy    . Breast biopsies      x2  . Appendectomy      was removed during hysterectomy  . Breast surgery Bilateral   . Coronary angioplasty  2012  . Colonoscopy  2013  . Esophagogastroduodenoscopy (egd) with propofol N/A 03/17/2015    Procedure: ESOPHAGOGASTRODUODENOSCOPY (EGD) WITH PROPOFOL;  Surgeon: Christene Lye, MD;  Location: [facility] ENDOSCOPY;  Service:  Gastroenterology;  Laterality: N/A;   Family History  Problem Relation Age of Onset  . Arthritis Mother   . Stroke Mother   . Hypertension Mother   . Osteoporosis Mother   . Heart disease Father     MI  . Stroke Brother   . Breast cancer      first cousin x 2  . Colon cancer Neg Hx    Social History   Social History  . Marital Status: Widowed    Spouse Name: N/A  . Number of Children: 2  . Years of Education: N/A   Social History Main Topics  . Smoking status: Never Smoker   . Smokeless tobacco: Never Used  . Alcohol Use: No  . Drug Use: No  . Sexual Activity: Not Asked   Other Topics Concern  . None   Social History Narrative    Outpatient Encounter Prescriptions as of 05/14/2015  Medication Sig  . aspirin 81 MG tablet Take 81 mg by mouth daily.  . calcium carbonate (TUMS - DOSED IN MG ELEMENTAL CALCIUM) 500 MG chewable tablet Chew 1 tablet by mouth daily as needed.   . carvedilol (COREG) 25 MG tablet TAKE ONE TABLET TWICE DAILY WITH A MEAL  . esomeprazole (NEXIUM) 40 MG  capsule Take 1 capsule (40 mg total) by mouth 2 (two) times daily before a meal.  . Ferrous Sulfate Dried (FEOSOL) 200 (65 FE) MG TABS Take by mouth.  . fish oil-omega-3 fatty acids 1000 MG capsule Take 1 g by mouth daily.  . fluticasone (FLONASE) 50 MCG/ACT nasal spray Place 2 sprays into both nostrils daily.  . Fluticasone Furoate-Vilanterol 100-25 MCG/INH AEPB Inhale 1 puff into the lungs daily.  Marland Kitchen losartan-hydrochlorothiazide (HYZAAR) 100-12.5 MG tablet TAKE ONE (1) TABLET EACH DAY  . Misc Natural Products (OSTEO BI-FLEX JOINT SHIELD PO) Take by mouth 2 (two) times daily.  . Multiple Vitamin (MULTIVITAMIN) tablet Take 1 tablet by mouth daily.  . pravastatin (PRAVACHOL) 20 MG tablet TAKE ONE (1) TABLET EACH DAY  . VITAMIN D, CHOLECALCIFEROL, PO Take 1,000 Units by mouth 2 (two) times daily.    No facility-administered encounter medications on file as of 05/14/2015.    Review of Systems    Constitutional: Negative for appetite change and unexpected weight change.  HENT: Negative for sinus pressure.        Some congestion and cough as outlined.  Better after taking levaquin.    Eyes: Negative for discharge and redness.  Respiratory: Positive for cough and shortness of breath. Negative for chest tightness.   Cardiovascular: Negative for chest pain, palpitations and leg swelling.  Gastrointestinal: Negative for nausea, vomiting, abdominal pain and diarrhea.  Genitourinary: Negative for dysuria and difficulty urinating.  Musculoskeletal: Negative for back pain and joint swelling.  Neurological: Negative for dizziness, light-headedness and headaches.  Psychiatric/Behavioral: Negative for dysphoric mood and agitation.       Objective:    Physical Exam  Constitutional: She is oriented to person, place, and time. She appears well-developed and well-nourished. No distress.  HENT:  Nose: Nose normal.  Mouth/Throat: Oropharynx is clear and moist.  Eyes: Conjunctivae are normal. Right eye exhibits no discharge. Left eye exhibits no discharge. No scleral icterus.  Neck: Neck supple. No thyromegaly present.  Cardiovascular: Normal rate and regular rhythm.   Pulmonary/Chest: Breath sounds normal. No accessory muscle usage. No tachypnea. No respiratory distress. She has no decreased breath sounds. She has no wheezes. She has no rhonchi. Right breast exhibits no inverted nipple, no mass, no nipple discharge and no tenderness (no axillary adenopathy). Left breast exhibits no inverted nipple, no mass, no nipple discharge and no tenderness (no axilarry adenopathy).  Abdominal: Soft. Bowel sounds are normal. There is no tenderness.  Musculoskeletal: She exhibits no edema or tenderness.  Lymphadenopathy:    She has no cervical adenopathy.  Neurological: She is alert and oriented to person, place, and time.  Skin: Skin is warm. No rash noted. No erythema.  Psychiatric: She has a normal mood  and affect. Her behavior is normal.    BP 110/80 mmHg  Pulse 68  Temp(Src) 98.2 F (36.8 C) (Oral)  Resp 18  Ht 5' (1.524 m)  Wt 179 lb 2 oz (81.251 kg)  BMI 34.98 kg/m2  SpO2 97%  LMP 05/01/1972 Wt Readings from Last 3 Encounters:  05/14/15 179 lb 2 oz (81.251 kg)  04/01/15 180 lb 3.2 oz (81.738 kg)  03/17/15 170 lb (77.111 kg)     Lab Results  Component Value Date   WBC 7.2 04/01/2015   HGB 11.7* 04/01/2015   HCT 35.9* 04/01/2015   PLT 241.0 04/01/2015   GLUCOSE 104* 02/05/2015   CHOL 193 02/05/2015   TRIG 83.0 02/05/2015   HDL 58.90 02/05/2015   LDLCALC 117*  02/05/2015   ALT 16 02/05/2015   AST 22 02/05/2015   NA 143 02/05/2015   K 4.7 02/05/2015   CL 106 02/05/2015   CREATININE 0.81 02/05/2015   BUN 13 02/05/2015   CO2 30 02/05/2015   TSH 1.53 02/05/2015   HGBA1C 6.4 02/05/2015       Assessment & Plan:   Problem List Items Addressed This Visit    Diabetes mellitus ([place])    Follow met b and a1c.   Lab Results  Component Value Date   HGBA1C 6.4 02/05/2015        Relevant Orders   Hemoglobin A1c   Microalbumin / creatinine urine ratio   DOE (dyspnea on exertion)    Saw pulmonary.  Being followed by Dr Stevenson Clinch.  See his note for details.  Has planned f/u with cardiology at the end of the month.  Able to take a better deep breath.  Still with sob with exertion.  Continue current inhaler regimen.  She does feel helping.  Continue nexium bid.  F/u with Dr Pryor Ochoa as outlined.        Essential hypertension, benign - Primary    Blood pressure as outlined.  Have her spot check her pressure.  Follow.  Same medication regimen.  Follow met b.      Relevant Orders   Basic metabolic panel   GERD (gastroesophageal reflux disease)    On nexium twice a day.  No acid reflux symptoms reported.  Planning to f/u with Dr Pryor Ochoa.        Health care maintenance    Physical 05/14/15 - today.  Colonoscopy 06/13/11.  Mammogram 06/29/14 - Birads I.        Hiatal hernia      Has a large hiatal hernia.  On nexium twice a day.  Feels she can getter a deeper breath.  Cough is some better.  Follow.        History of colonic polyps    Colonoscopy 06/23/11 - diverticulosis otherwise normal.  Recommended f/u colonoscopy in five years.       Hypercholesterolemia    On pravastatin.  Low cholesterol diet and exercise.  Follow lipid panel and liver function tests.        Relevant Orders   Lipid panel   Hepatic function panel   Obesity (BMI 30-39.9)    Diet and exercise.        Obstructive sleep apnea    CPAP.       SOB (shortness of breath)    Seeing Dr Stevenson Clinch.  Still with sob with exertion.  Does feel she can get a deeper breath.  Cough is some better.  Continue nexium.  Has f/u planned with pulmonary and with ENT.  Hold on changing blood pressure medication.        Stress    Handling stress.  Has good support.  Does not feel needs any further intervention.  Follow.         Other Visit Diagnoses    Anemia, unspecified anemia type        Relevant Orders    CBC with Differential/Platelet    Ferritin        Einar Pheasant, MD

## 2015-05-16 ENCOUNTER — Encounter: Payer: Self-pay | Admitting: Internal Medicine

## 2015-05-16 DIAGNOSIS — E119 Type 2 diabetes mellitus without complications: Secondary | ICD-10-CM | POA: Insufficient documentation

## 2015-05-16 NOTE — Assessment & Plan Note (Signed)
Physical 05/14/15 - today.  Colonoscopy 06/13/11.  Mammogram 06/29/14 - Birads I.

## 2015-05-16 NOTE — Assessment & Plan Note (Signed)
On pravastatin.  Low cholesterol diet and exercise.  Follow lipid panel and liver function tests.   

## 2015-05-16 NOTE — Assessment & Plan Note (Signed)
Colonoscopy 06/23/11 - diverticulosis otherwise normal.  Recommended f/u colonoscopy in five years.

## 2015-05-16 NOTE — Assessment & Plan Note (Signed)
Saw pulmonary.  Being followed by Dr Stevenson Clinch.  See his note for details.  Has planned f/u with cardiology at the end of the month.  Able to take a better deep breath.  Still with sob with exertion.  Continue current inhaler regimen.  She does feel helping.  Continue nexium bid.  F/u with Dr Pryor Ochoa as outlined.

## 2015-05-16 NOTE — Assessment & Plan Note (Signed)
Seeing Dr Stevenson Clinch.  Still with sob with exertion.  Does feel she can get a deeper breath.  Cough is some better.  Continue nexium.  Has f/u planned with pulmonary and with ENT.  Hold on changing blood pressure medication.

## 2015-05-16 NOTE — Assessment & Plan Note (Signed)
Diet and exercise.   

## 2015-05-16 NOTE — Assessment & Plan Note (Signed)
Has a large hiatal hernia.  On nexium twice a day.  Feels she can getter a deeper breath.  Cough is some better.  Follow.

## 2015-05-16 NOTE — Assessment & Plan Note (Signed)
On nexium twice a day.  No acid reflux symptoms reported.  Planning to f/u with Dr Pryor Ochoa.

## 2015-05-16 NOTE — Assessment & Plan Note (Signed)
Follow met b and a1c.   Lab Results  Component Value Date   HGBA1C 6.4 02/05/2015

## 2015-05-16 NOTE — Assessment & Plan Note (Signed)
Blood pressure as outlined.  Have her spot check her pressure.  Follow.  Same medication regimen.  Follow met b.

## 2015-05-16 NOTE — Assessment & Plan Note (Signed)
CPAP.  

## 2015-05-16 NOTE — Assessment & Plan Note (Signed)
Handling stress.  Has good support.  Does not feel needs any further intervention.  Follow.

## 2015-05-18 DIAGNOSIS — K219 Gastro-esophageal reflux disease without esophagitis: Secondary | ICD-10-CM | POA: Diagnosis not present

## 2015-06-01 ENCOUNTER — Encounter: Payer: Self-pay | Admitting: Cardiovascular Disease

## 2015-06-01 ENCOUNTER — Ambulatory Visit (INDEPENDENT_AMBULATORY_CARE_PROVIDER_SITE_OTHER): Payer: Medicare Other | Admitting: Cardiovascular Disease

## 2015-06-01 VITALS — BP 178/78 | HR 64 | Ht 60.0 in | Wt 181.8 lb

## 2015-06-01 DIAGNOSIS — R0602 Shortness of breath: Secondary | ICD-10-CM | POA: Diagnosis not present

## 2015-06-01 DIAGNOSIS — E669 Obesity, unspecified: Secondary | ICD-10-CM

## 2015-06-01 DIAGNOSIS — I2510 Atherosclerotic heart disease of native coronary artery without angina pectoris: Secondary | ICD-10-CM | POA: Diagnosis not present

## 2015-06-01 DIAGNOSIS — I25110 Atherosclerotic heart disease of native coronary artery with unstable angina pectoris: Secondary | ICD-10-CM | POA: Insufficient documentation

## 2015-06-01 DIAGNOSIS — I10 Essential (primary) hypertension: Secondary | ICD-10-CM | POA: Diagnosis not present

## 2015-06-01 DIAGNOSIS — R079 Chest pain, unspecified: Secondary | ICD-10-CM

## 2015-06-01 DIAGNOSIS — E119 Type 2 diabetes mellitus without complications: Secondary | ICD-10-CM

## 2015-06-01 DIAGNOSIS — E7800 Pure hypercholesterolemia, unspecified: Secondary | ICD-10-CM

## 2015-06-01 MED ORDER — POTASSIUM CHLORIDE ER 10 MEQ PO TBCR: 10.0000 meq | EXTENDED_RELEASE_TABLET | Freq: Every day | ORAL | Status: DC | PRN

## 2015-06-01 MED ORDER — FUROSEMIDE 20 MG PO TABS: 20.0000 mg | ORAL_TABLET | Freq: Every day | ORAL | Status: DC | PRN

## 2015-06-01 MED ORDER — PRAVASTATIN SODIUM 40 MG PO TABS: 40.0000 mg | ORAL_TABLET | Freq: Every day | ORAL | Status: DC

## 2015-06-01 NOTE — Assessment & Plan Note (Signed)
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

## 2015-06-01 NOTE — Progress Notes (Signed)
Patient ID: April [name], female    DOB: [birth year], 75 y.o.   MRN: [id number]  HPI Comments: April [name] a pleasant 75 year old woman with history of coronary artery disease, 40% proximal RCA disease on cardiac catheterization in 01/2010, chronic shortness of breath who presents by referral from Dr. Nicki Reaper for shortness of breath symptoms.  She reports having previous workup by [facility] cardiology, Dr. Josefa Half. She's had several cardiac catheterizations over the years, lasting 2011 showing very mild nonobstructive proximal RCA disease.  Echocardiogram in 2015 showing normal ejection fraction, borderline elevated right ventricular systolic pressures, diastolic relaxation abnormality.  Her main complaint is shortness of breath which she notices when walking/exertion, on flat surfaces, much worse with hills and stairs. She's had significant leg swelling over the past month. She has done some traveling. Takes low-dose HCTZ 12.5 mg daily, was previously on a higher dose but had hypotension. She does drink significant fluids in the daytime. She denies any significant chest pain on exertion  CT scan of the chest recently, reviewed with her in detail. This showed moderate hiatal hernia, cardiomegaly, Mild aortic atherosclerosis seen. No significant coronary calcification seen in the LAD or left circumflex, moderate calcification seen in the proximal RCA  Lab work reviewed with her showing total cholesterol 190, she takes pravastatin 20 mg daily  EKG on today's visit shows no sinus rhythm with rate 64 bpm, poor R-wave progression through the anterior precordial leads     Allergies  Allergen Reactions  . Penicillins     Okay to take amoxicillin   . Sulfa Antibiotics     Current Outpatient Prescriptions on File Prior to Visit  Medication Sig Dispense Refill  . aspirin 81 MG tablet Take 81 mg by mouth daily.    . calcium carbonate (TUMS - DOSED IN MG ELEMENTAL CALCIUM) 500 MG chewable  tablet Chew 1 tablet by mouth daily as needed.     . carvedilol (COREG) 25 MG tablet TAKE ONE TABLET TWICE DAILY WITH A MEAL 60 tablet 3  . esomeprazole (NEXIUM) 40 MG capsule Take 1 capsule (40 mg total) by mouth 2 (two) times daily before a meal. 180 capsule 1  . Ferrous Sulfate Dried (FEOSOL) 200 (65 FE) MG TABS Take by mouth.    . fish oil-omega-3 fatty acids 1000 MG capsule Take 1 g by mouth daily.    . fluticasone (FLONASE) 50 MCG/ACT nasal spray Place 2 sprays into both nostrils daily.    . Fluticasone Furoate-Vilanterol 100-25 MCG/INH AEPB Inhale 1 puff into the lungs daily. 60 each 5  . losartan-hydrochlorothiazide (HYZAAR) 100-12.5 MG tablet TAKE ONE (1) TABLET EACH DAY 30 tablet 4  . Misc Natural Products (OSTEO BI-FLEX JOINT SHIELD PO) Take by mouth 2 (two) times daily.    . Multiple Vitamin (MULTIVITAMIN) tablet Take 1 tablet by mouth daily.    Marland Kitchen VITAMIN D, CHOLECALCIFEROL, PO Take 1,000 Units by mouth 2 (two) times daily.      No current facility-administered medications on file prior to visit.    Past Medical History  Diagnosis Date  . Hypertension   . Hypercholesterolemia   . History of colon polyps 08/1994  . Osteoporosis   . Sleep apnea     CPAP  . Arrhythmia     history of an irregular heart beat  . Diverticulitis   . Urine incontinence   . Rheumatic fever   . Migraines     history of migraines  . Endometriosis   . Hiatal hernia   .  Heart murmur     Past Surgical History  Procedure Laterality Date  . Abdominal hysterectomy      ovaries not removed  . Tonsillectomy    . Breast biopsies      x2  . Appendectomy      was removed during hysterectomy  . Breast surgery Bilateral   . Colonoscopy  2013  . Esophagogastroduodenoscopy (egd) with propofol N/A 03/17/2015    Procedure: ESOPHAGOGASTRODUODENOSCOPY (EGD) WITH PROPOFOL;  Surgeon: Christene Lye, MD;  Location: [facility] ENDOSCOPY;  Service: Gastroenterology;  Laterality: N/A;  . Coronary angioplasty   2012    Social History  reports that she has never smoked. She has never used smokeless tobacco. She reports that she does not drink alcohol or use illicit drugs.  Family History family history includes Arthritis in her mother; Heart attack in her father; Heart disease in her father; Hypertension in her mother; Osteoporosis in her mother; Stroke in her brother and mother. There is no history of Colon cancer.   Review of Systems  Constitutional: Negative.   Respiratory: Negative.   Cardiovascular: Negative.   Gastrointestinal: Negative.   Musculoskeletal: Negative.   Neurological: Negative.   Hematological: Negative.   Psychiatric/Behavioral: Negative.   All other systems reviewed and are negative.   BP 178/78 mmHg  Pulse 64  Ht 5' (1.524 m)  Wt 181 lb 12 oz (82.441 kg)  BMI 35.50 kg/m2  LMP 05/01/1972  Physical Exam  Constitutional: She is oriented to person, place, and time. She appears well-developed and well-nourished.  Obese  HENT:  Head: Normocephalic.  Nose: Nose normal.  Mouth/Throat: Oropharynx is clear and moist.  Eyes: Conjunctivae are normal. Pupils are equal, round, and reactive to light.  Neck: Normal range of motion. Neck supple. No JVD present.  Cardiovascular: Normal rate, regular rhythm, normal heart sounds and intact distal pulses.  Exam reveals no gallop and no friction rub.   No murmur heard. Nonpitting edema to below the knees bilaterally  Pulmonary/Chest: Effort normal and breath sounds normal. No respiratory distress. She has no wheezes. She has no rales. She exhibits no tenderness.  Abdominal: Soft. Bowel sounds are normal. She exhibits no distension. There is no tenderness.  Musculoskeletal: Normal range of motion. She exhibits no edema or tenderness.  Lymphadenopathy:    She has no cervical adenopathy.  Neurological: She is alert and oriented to person, place, and time. Coordination normal.  Skin: Skin is warm and dry. No rash noted. No  erythema.  Psychiatric: She has a normal mood and affect. Her behavior is normal. Judgment and thought content normal.

## 2015-06-01 NOTE — Assessment & Plan Note (Signed)
Recommended she decrease her carbohydrate intake. She does report eating lots of potatoes recommended weight loss

## 2015-06-01 NOTE — Assessment & Plan Note (Signed)
Blood pressure is very elevated on today's visit. She reports it is typically 130 up to 140 at home. Recommend she check his closely as she does not do this on a regular basis and call our office next week with numbers. Systolic pressure was [id number] or more today, possibly from anxiety.

## 2015-06-01 NOTE — Assessment & Plan Note (Signed)
40% RCA disease noted on prior cardiac catheterization in 2011. CT scan of the chest again documenting RCA disease. No other significant disease in LAD or left circumflex on CT scan Symptoms do not seem like angina at this time, though if symptoms do get worse, stress testing could be ordered

## 2015-06-01 NOTE — Assessment & Plan Note (Signed)
Given her underlying coronary artery disease on CT scan and by catheterization, at least mild diffuse aortic plaque/atherosclerosis, recommended she increase her pravastatin up to 40 mg daily. May need to add Zetia or even change to stronger statin. Ideally LDL should be less than 70

## 2015-06-01 NOTE — Patient Instructions (Addendum)
You are doing well. Shortness of breath could be from fluid retention  Please take lasix/furosemide one pill as needed in the morning for shortness of breath, leg swelling Take with potassium  Please increase pravastatin up to 40 mg daily  Blood pressure is elevated today Please monitor blood pressure at home Goal is <140 on the top Please call the office with numbers  Please wear compression hose with a zipper for leg edema Leg elevation when home  Daily walking!  Please call us if you have new issues that need to be addressed before your next appt.  Your physician wants you to follow-up in: 3 months.  You will receive a reminder letter in the mail two months in advance. If you don't receive a letter, please call our office to schedule the follow-up appointment.  Edema Edema is an abnormal buildup of fluids in your bodytissues. Edema is somewhatdependent on gravity to pull the fluid to the lowest place in your body. That makes the condition more common in the legs and thighs (lower extremities). Painless swelling of the feet and ankles is common and becomes more likely as you get older. It is also common in looser tissues, like around your eyes.  When the affected area is squeezed, the fluid may move out of that spot and leave a dent for a few moments. This dent is called pitting.  CAUSES  There are many possible causes of edema. Eating too much salt and being on your feet or sitting for a long time can cause edema in your legs and ankles. Hot weather may make edema worse. Common medical causes of edema include:  Heart failure.  Liver disease.  Kidney disease.  Weak blood vessels in your legs.  Cancer.  An injury.  Pregnancy.  Some medications.  Obesity. SYMPTOMS  Edema is usually painless.Your skin may look swollen or shiny.  DIAGNOSIS  Your health care provider may be able to diagnose edema by asking about your medical history and doing a physical exam. You may  need to have tests such as X-rays, an electrocardiogram, or blood tests to check for medical conditions that may cause edema.  TREATMENT  Edema treatment depends on the cause. If you have heart, liver, or kidney disease, you need the treatment appropriate for these conditions. General treatment may include:  Elevation of the affected body part above the level of your heart.  Compression of the affected body part. Pressure from elastic bandages or support stockings squeezes the tissues and forces fluid back into the blood vessels. This keeps fluid from entering the tissues.  Restriction of fluid and salt intake.  Use of a water pill (diuretic). These medications are appropriate only for some types of edema. They pull fluid out of your body and make you urinate more often. This gets rid of fluid and reduces swelling, but diuretics can have side effects. Only use diuretics as directed by your health care provider. HOME CARE INSTRUCTIONS   Keep the affected body part above the level of your heart when you are lying down.   Do not sit still or stand for prolonged periods.   Do not put anything directly under your knees when lying down.  Do not wear constricting clothing or garters on your upper legs.   Exercise your legs to work the fluid back into your blood vessels. This may help the swelling go down.   Wear elastic bandages or support stockings to reduce ankle swelling as directed by your health  care provider.   Eat a low-salt diet to reduce fluid if your health care provider recommends it.   Only take medicines as directed by your health care provider. SEEK MEDICAL CARE IF:   Your edema is not responding to treatment.  You have heart, liver, or kidney disease and notice symptoms of edema.  You have edema in your legs that does not improve after elevating them.   You have sudden and unexplained weight gain. SEEK IMMEDIATE MEDICAL CARE IF:   You develop shortness of breath  or chest pain.   You cannot breathe when you lie down.  You develop pain, redness, or warmth in the swollen areas.   You have heart, liver, or kidney disease and suddenly get edema.  You have a fever and your symptoms suddenly get worse. MAKE SURE YOU:   Understand these instructions.  Will watch your condition.  Will get help right away if you are not doing well or get worse.   This information is not intended to replace advice given to you by your health care provider. Make sure you discuss any questions you have with your health care provider.   Document Released: 04/17/2005 Document Revised: 05/08/2014 Document Reviewed: 02/07/2013 Elsevier Interactive Patient Education 2016 [employer]. Hypertension Hypertension, commonly called high blood pressure, is when the force of blood pumping through your arteries is too strong. Your arteries are the blood vessels that carry blood from your heart throughout your body. A blood pressure reading consists of a higher number over a lower number, such as 110/72. The higher number (systolic) is the pressure inside your arteries when your heart pumps. The lower number (diastolic) is the pressure inside your arteries when your heart relaxes. Ideally you want your blood pressure below 120/80. Hypertension forces your heart to work harder to pump blood. Your arteries may become narrow or stiff. Having untreated or uncontrolled hypertension can cause heart attack, stroke, kidney disease, and other problems. RISK FACTORS Some risk factors for high blood pressure are controllable. Others are not.  Risk factors you cannot control include:   Race. You may be at higher risk if you are African American.  Age. Risk increases with age.  Gender. Men are at higher risk than women before age 29 years. After age 28, women are at higher risk than men. Risk factors you can control include:  Not getting enough exercise or physical activity.  Being  overweight.  Getting too much fat, sugar, calories, or salt in your diet.  Drinking too much alcohol. SIGNS AND SYMPTOMS Hypertension does not usually cause signs or symptoms. Extremely high blood pressure (hypertensive crisis) may cause headache, anxiety, shortness of breath, and nosebleed. DIAGNOSIS To check if you have hypertension, your health care provider will measure your blood pressure while you are seated, with your arm held at the level of your heart. It should be measured at least twice using the same arm. Certain conditions can cause a difference in blood pressure between your right and left arms. A blood pressure reading that is higher than normal on one occasion does not mean that you need treatment. If it is not clear whether you have high blood pressure, you may be asked to return on a different day to have your blood pressure checked again. Or, you may be asked to monitor your blood pressure at home for 1 or more weeks. TREATMENT Treating high blood pressure includes making lifestyle changes and possibly taking medicine. Living a healthy lifestyle can help lower  high blood pressure. You may need to change some of your habits. Lifestyle changes may include:  Following the DASH diet. This diet is high in fruits, vegetables, and whole grains. It is low in salt, red meat, and added sugars.  Keep your sodium intake below 2,300 mg per day.  Getting at least 30-45 minutes of aerobic exercise at least 4 times per week.  Losing weight if necessary.  Not smoking.  Limiting alcoholic beverages.  Learning ways to reduce stress. Your health care provider may prescribe medicine if lifestyle changes are not enough to get your blood pressure under control, and if one of the following is true:  You are 49-9 years of age and your systolic blood pressure is above 140.  You are 34 years of age or older, and your systolic blood pressure is above 150.  Your diastolic blood pressure is  above 90.  You have diabetes, and your systolic blood pressure is over [id number] or your diastolic blood pressure is over 90.  You have kidney disease and your blood pressure is above 140/90.  You have heart disease and your blood pressure is above 140/90. Your personal target blood pressure may vary depending on your medical conditions, your age, and other factors. HOME CARE INSTRUCTIONS  Have your blood pressure rechecked as directed by your health care provider.   Take medicines only as directed by your health care provider. Follow the directions carefully. Blood pressure medicines must be taken as prescribed. The medicine does not work as well when you skip doses. Skipping doses also puts you at risk for problems.  Do not smoke.   Monitor your blood pressure at home as directed by your health care provider. SEEK MEDICAL CARE IF:   You think you are having a reaction to medicines taken.  You have recurrent headaches or feel dizzy.  You have swelling in your ankles.  You have trouble with your vision. SEEK IMMEDIATE MEDICAL CARE IF:  You develop a severe headache or confusion.  You have unusual weakness, numbness, or feel faint.  You have severe chest or abdominal pain.  You vomit repeatedly.  You have trouble breathing. MAKE SURE YOU:   Understand these instructions.  Will watch your condition.  Will get help right away if you are not doing well or get worse.   This information is not intended to replace advice given to you by your health care provider. Make sure you discuss any questions you have with your health care provider.   Document Released: 04/17/2005 Document Revised: 09/01/2014 Document Reviewed: 02/07/2013 Elsevier Interactive Patient Education 2016 [employer]. How to Use Compression Stockings Compression stockings are elastic socks that squeeze the legs. They help to increase blood flow to the legs, decrease swelling in the legs, and reduce the chance  of developing blood clots in the lower legs. Compression stockings are often used by people who:  Are recovering from surgery.  Have poor circulation in their legs.  Are prone to getting blood clots in their legs.  Have varicose veins.  Sit or stay in bed for long periods of time. HOW TO USE COMPRESSION STOCKINGS Before you put on your compression stockings:  Make sure that they are the correct size. If you do not know your size, ask your health care provider.  Make sure that they are clean, dry, and in good condition.  Check them for rips and tears. Do not put them on if they are ripped or torn. Put your stockings on  first thing in the morning, before you get out of bed. Keep them on for as long as your health care provider advises. When you are wearing your stockings:  Keep them as smooth as possible. Do not allow them to bunch up. It is especially important to prevent the stockings from bunching up around your toes or behind your knees.  Do not roll the stockings downward and leave them rolled down. This can decrease blood flow to your leg.  Change them right away if they become wet or dirty. When you take off your stockings, inspect your legs and feet. Anything that does not seem normal may require medical attention. Look for:  Open sores.  Red spots.  Swelling. INFORMATION AND TIPS  Do not stop wearing your compression stockings without talking to your health care provider first.  Wash your stockings everyday with mild detergent in cold or warm water. Do not use bleach. Air-dry your stockings or dry them in a clothes dryer on low heat.  Replace your stockings every 3-6 months.  If skin moisturizing is part of your treatment plan, apply lotion or cream at night so that your skin will be dry when you put on the stockings in the morning. It is harder to put the stockings on when you have lotion on your legs or feet. SEEK MEDICAL CARE IF: Remove your stockings and seek  medical care if:  You have a feeling of pins and needles in your feet or legs.  You have any new changes in your skin.  You have skin lesions that are getting worse.  You have swelling or pain that is getting worse. SEEK IMMEDIATE MEDICAL CARE IF:  You have numbness or tingling in your lower legs that does not get better immediately after you take the stockings off.  Your toes or feet become cold and blue.  You develop open sores or red spots on your legs that do not go away.  You see or feel a warm spot on your leg.  You have new swelling or soreness in your leg.  You are short of breath or you have chest pain for no reason.  You have a rapid or irregular heartbeat.  You feel light-headed or dizzy.   This information is not intended to replace advice given to you by your health care provider. Make sure you discuss any questions you have with your health care provider.   Document Released: 02/12/2009 Document Revised: 09/01/2014 Document Reviewed: 03/25/2014 Elsevier Interactive Patient Education [employer].

## 2015-06-01 NOTE — Assessment & Plan Note (Signed)
Etiology of her SOB is likely multifactorial, including obesity, deconditioning, hypertension High likelihood of chronic diastolic CHF given her high fluid intake, echocardiogram 1.5 years ago documenting diastolic relaxation abnormality, borderline elevated right heart pressures. Recent worsening of her leg edema (unable to exclude component of venous insufficiency). Recommended she try Lasix when necessary for leg edema, abdominal bloating, shortness of breath. Suggested she take this with K Dur 10 mEq.We'll hold off on repeat echocardiogram for now as prior echocardiogram showed normal ejection fractioncad

## 2015-06-14 ENCOUNTER — Other Ambulatory Visit: Payer: Medicare Other

## 2015-06-17 ENCOUNTER — Other Ambulatory Visit (INDEPENDENT_AMBULATORY_CARE_PROVIDER_SITE_OTHER): Payer: Medicare Other

## 2015-06-17 DIAGNOSIS — E119 Type 2 diabetes mellitus without complications: Secondary | ICD-10-CM

## 2015-06-17 DIAGNOSIS — D649 Anemia, unspecified: Secondary | ICD-10-CM

## 2015-06-17 DIAGNOSIS — E7800 Pure hypercholesterolemia, unspecified: Secondary | ICD-10-CM | POA: Diagnosis not present

## 2015-06-17 DIAGNOSIS — I10 Essential (primary) hypertension: Secondary | ICD-10-CM | POA: Diagnosis not present

## 2015-06-17 LAB — BASIC METABOLIC PANEL
BUN: 20 mg/dL (ref 6–23)
CO2: 31 meq/L (ref 19–32)
Calcium: 9.1 mg/dL (ref 8.4–10.5)
Chloride: 103 mEq/L (ref 96–112)
Creatinine, Ser: 0.68 mg/dL (ref 0.40–1.20)
GFR: 89.26 mL/min (ref >60.00)
GLUCOSE: 102 mg/dL — AB (ref 70–99)
POTASSIUM: 4.7 meq/L (ref 3.5–5.1)
Sodium: 142 mEq/L (ref 135–145)

## 2015-06-17 LAB — CBC WITH DIFFERENTIAL/PLATELET
BASOS ABS: 0 10*3/uL (ref 0.0–0.1)
Basophils Relative: 0.4 % (ref 0.0–3.0)
EOS ABS: 0.2 10*3/uL (ref 0.0–0.7)
Eosinophils Relative: 2.9 % (ref 0.0–5.0)
HCT: 40.2 % (ref 36.0–46.0)
Hemoglobin: 13.3 g/dL (ref 12.0–15.0)
LYMPHS ABS: 1.9 10*3/uL (ref 0.7–4.0)
Lymphocytes Relative: 32.3 % (ref 12.0–46.0)
MCHC: 33 g/dL (ref 30.0–36.0)
MCV: 89.8 fl (ref 78.0–100.0)
Monocytes Absolute: 0.7 10*3/uL (ref 0.1–1.0)
Monocytes Relative: 12.8 % — ABNORMAL HIGH (ref 3.0–12.0)
NEUTROS ABS: 3 10*3/uL (ref 1.4–7.7)
NEUTROS PCT: 51.6 % (ref 43.0–77.0)
PLATELETS: 216 10*3/uL (ref 150.0–400.0)
RBC: 4.48 Mil/uL (ref 3.87–5.11)
RDW: 13.5 % (ref 11.5–15.5)
WBC: 5.7 10*3/uL (ref 4.0–10.5)

## 2015-06-17 LAB — HEPATIC FUNCTION PANEL
ALK PHOS: 64 U/L (ref 39–117)
ALT: 18 U/L (ref 0–35)
AST: 25 U/L (ref 0–37)
Albumin: 3.9 g/dL (ref 3.5–5.2)
BILIRUBIN DIRECT: 0.1 mg/dL (ref 0.0–0.3)
BILIRUBIN TOTAL: 0.4 mg/dL (ref 0.2–1.2)
TOTAL PROTEIN: 6.7 g/dL (ref 6.0–8.3)

## 2015-06-17 LAB — MICROALBUMIN / CREATININE URINE RATIO
Creatinine,U: 137.9 mg/dL
MICROALB/CREAT RATIO: 3 mg/g (ref 0.0–30.0)
Microalb, Ur: 4.2 mg/dL — ABNORMAL HIGH (ref 0.0–1.9)

## 2015-06-17 LAB — LIPID PANEL
Cholesterol: 195 mg/dL (ref 0–200)
HDL: 65.4 mg/dL (ref >39.00)
LDL Cholesterol: 114 mg/dL — ABNORMAL HIGH (ref 0–99)
NONHDL: 129.44
TRIGLYCERIDES: 79 mg/dL (ref 0.0–149.0)
Total CHOL/HDL Ratio: 3
VLDL: 15.8 mg/dL (ref 0.0–40.0)

## 2015-06-17 LAB — HEMOGLOBIN A1C: Hgb A1c MFr Bld: 6.5 % (ref 4.6–6.5)

## 2015-06-17 LAB — FERRITIN: Ferritin: 44.9 ng/mL (ref 10.0–291.0)

## 2015-06-18 ENCOUNTER — Encounter: Payer: Self-pay | Admitting: *Deleted

## 2015-06-30 ENCOUNTER — Ambulatory Visit: Payer: Medicare Other

## 2015-07-01 ENCOUNTER — Ambulatory Visit
Admission: RE | Admit: 2015-07-01 | Discharge: 2015-07-01 | Disposition: A | Discharge status: Home / Self Care | Payer: Medicare Other | Source: Ambulatory Visit | Attending: General Surgery | Admitting: General Surgery

## 2015-07-01 DIAGNOSIS — Z1231 Encounter for screening mammogram for malignant neoplasm of breast: Secondary | ICD-10-CM | POA: Diagnosis not present

## 2015-07-06 ENCOUNTER — Ambulatory Visit (INDEPENDENT_AMBULATORY_CARE_PROVIDER_SITE_OTHER): Payer: Medicare Other | Admitting: General Surgery

## 2015-07-06 ENCOUNTER — Encounter: Payer: Self-pay | Admitting: General Surgery

## 2015-07-06 VITALS — BP 148/82 | HR 70 | Resp 12 | Ht 60.0 in | Wt 181.0 lb

## 2015-07-06 DIAGNOSIS — N6019 Diffuse cystic mastopathy of unspecified breast: Secondary | ICD-10-CM

## 2015-07-06 DIAGNOSIS — Z860100 Personal history of colon polyps, unspecified: Secondary | ICD-10-CM

## 2015-07-06 DIAGNOSIS — I2510 Atherosclerotic heart disease of native coronary artery without angina pectoris: Secondary | ICD-10-CM | POA: Diagnosis not present

## 2015-07-06 DIAGNOSIS — K219 Gastro-esophageal reflux disease without esophagitis: Secondary | ICD-10-CM

## 2015-07-06 NOTE — Patient Instructions (Signed)
Return in 1 yr with bilateral screening mammogram. Colonoscopy due in 2018.

## 2015-07-06 NOTE — Progress Notes (Signed)
Patient ID: April [name], female   DOB: 05/13/1940, 75 y.o.   MRN: [id number]  Chief Complaint  Patient presents with  . Follow-up    mammogram    HPI April [name] is a 75 y.o. female who presents for a breast evaluation. The most recent mammogram was done on 07-01-15.  Patient does perform regular self breast checks and gets regular mammograms done.   No new breast issues. In November 2016 she had endoscopy due to persistent symptoms from GERD. Other than small to medium hiatal hernia there were no findings for concern.  I have reviewed the history of present illness with the patient.   HPI  Past Medical History  Diagnosis Date  . Hypertension   . Hypercholesterolemia   . History of colon polyps 08/1994  . Osteoporosis   . Sleep apnea     CPAP  . Arrhythmia     history of an irregular heart beat  . Diverticulitis   . Urine incontinence   . Rheumatic fever   . Migraines     history of migraines  . Endometriosis   . Hiatal hernia   . Heart murmur     Past Surgical History  Procedure Laterality Date  . Abdominal hysterectomy      ovaries not removed  . Tonsillectomy    . Breast biopsies      x2  . Appendectomy      was removed during hysterectomy  . Breast surgery Bilateral   . Colonoscopy  2013  . Esophagogastroduodenoscopy (egd) with propofol N/A 03/17/2015    Procedure: ESOPHAGOGASTRODUODENOSCOPY (EGD) WITH PROPOFOL;  Surgeon: Christene Lye, MD;  Location: [facility] ENDOSCOPY;  Service: Gastroenterology;  Laterality: N/A;  . Coronary angioplasty  2012  . Breast biopsy Bilateral "years ago"    Family History  Problem Relation Age of Onset  . Arthritis Mother   . Stroke Mother   . Hypertension Mother   . Osteoporosis Mother   . Heart disease Father     MI  . Heart attack Father   . Stroke Brother   . Breast cancer      first cousin x 2  . Colon cancer Neg Hx     Social History Social History  Substance Use Topics  . Smoking status: Never  Smoker   . Smokeless tobacco: Never Used  . Alcohol Use: No    Allergies  Allergen Reactions  . Penicillins     Okay to take amoxicillin   . Sulfa Antibiotics     Current Outpatient Prescriptions  Medication Sig Dispense Refill  . aspirin 81 MG tablet Take 81 mg by mouth daily.    . calcium carbonate (TUMS - DOSED IN MG ELEMENTAL CALCIUM) 500 MG chewable tablet Chew 1 tablet by mouth daily as needed.     . carvedilol (COREG) 25 MG tablet TAKE ONE TABLET TWICE DAILY WITH A MEAL 60 tablet 3  . esomeprazole (NEXIUM) 40 MG capsule Take 1 capsule (40 mg total) by mouth 2 (two) times daily before a meal. 180 capsule 1  . Ferrous Sulfate Dried (FEOSOL) 200 (65 FE) MG TABS Take by mouth.    . fish oil-omega-3 fatty acids 1000 MG capsule Take 1 g by mouth daily.    . fluticasone (FLONASE) 50 MCG/ACT nasal spray Place 2 sprays into both nostrils daily.    . Fluticasone Furoate-Vilanterol 100-25 MCG/INH AEPB Inhale 1 puff into the lungs daily. 60 each 5  . furosemide (LASIX) 20 MG tablet  Take 1 tablet (20 mg total) by mouth daily as needed. 30 tablet 11  . losartan-hydrochlorothiazide (HYZAAR) 100-12.5 MG tablet TAKE ONE (1) TABLET EACH DAY 30 tablet 4  . meloxicam (MOBIC) 15 MG tablet Take 15 mg by mouth daily.    . Misc Natural Products (OSTEO BI-FLEX JOINT SHIELD PO) Take by mouth 2 (two) times daily.    . Multiple Vitamin (MULTIVITAMIN) tablet Take 1 tablet by mouth daily.    . potassium chloride (K-DUR) 10 MEQ tablet Take 1 tablet (10 mEq total) by mouth daily as needed. 30 tablet 11  . pravastatin (PRAVACHOL) 40 MG tablet Take 1 tablet (40 mg total) by mouth daily. 90 tablet 3  . VITAMIN D, CHOLECALCIFEROL, PO Take 1,000 Units by mouth 2 (two) times daily.      No current facility-administered medications for this visit.    Review of Systems Review of Systems  Constitutional: Negative.   Respiratory: Positive for cough.   Cardiovascular: Negative.     Blood pressure 148/82, pulse  70, resp. rate 12, height 5' (1.524 m), weight 181 lb (82.101 kg), last menstrual period 05/01/1972.  Physical Exam Physical Exam  Constitutional: She is oriented to person, place, and time. She appears well-developed and well-nourished.  HENT:  Mouth/Throat: Oropharynx is clear and moist.  Eyes: Conjunctivae are normal. No scleral icterus.  Neck: Neck supple.  Cardiovascular: Normal rate, regular rhythm and normal heart sounds.   Pulmonary/Chest: Effort normal and breath sounds normal. Right breast exhibits no inverted nipple, no mass, no nipple discharge, no skin change and no tenderness. Left breast exhibits no inverted nipple, no mass, no nipple discharge, no skin change and no tenderness.  Abdominal: Soft. Normal appearance. There is no hepatomegaly. There is no tenderness.  Lymphadenopathy:    She has no cervical adenopathy.    She has no axillary adenopathy.  Neurological: She is alert and oriented to person, place, and time.  Skin: Skin is warm and dry.  Psychiatric: Her behavior is normal.    Data Reviewed Mammogram reviewed, no abnormalities noted.   Assessment    Stable exam. History of FCD. History of colon polyp. GERD    Plan    Return in 1 yr with bilateral screening mammogram. Colonoscopy due in 2018.     PCP:  Einar Pheasant This information has been scribed by Karie Fetch RNBC.    Kalyiah Saintil G 07/06/2015, 2:08 PM

## 2015-07-20 ENCOUNTER — Other Ambulatory Visit: Payer: Self-pay | Admitting: Internal Medicine

## 2015-07-20 ENCOUNTER — Encounter: Payer: Self-pay | Admitting: Internal Medicine

## 2015-07-20 ENCOUNTER — Ambulatory Visit (INDEPENDENT_AMBULATORY_CARE_PROVIDER_SITE_OTHER): Payer: Medicare Other | Admitting: Internal Medicine

## 2015-07-20 VITALS — BP 144/78 | HR 77 | Ht 60.0 in | Wt 182.2 lb

## 2015-07-20 DIAGNOSIS — R0600 Dyspnea, unspecified: Secondary | ICD-10-CM

## 2015-07-20 DIAGNOSIS — I2510 Atherosclerotic heart disease of native coronary artery without angina pectoris: Secondary | ICD-10-CM | POA: Diagnosis not present

## 2015-07-20 DIAGNOSIS — R0609 Other forms of dyspnea: Secondary | ICD-10-CM | POA: Diagnosis not present

## 2015-07-20 MED ORDER — FLUTICASONE FUROATE-VILANTEROL 100-25 MCG/INH IN AEPB: 1.0000 | INHALATION_SPRAY | Freq: Every day | RESPIRATORY_TRACT | Status: DC

## 2015-07-20 MED ORDER — ALBUTEROL SULFATE HFA 108 (90 BASE) MCG/ACT IN AERS: 2.0000 | INHALATION_SPRAY | RESPIRATORY_TRACT | Status: DC | PRN

## 2015-07-20 NOTE — Patient Instructions (Signed)
Follow up with Dr. Stevenson Clinch in: 4 months - Continue with [facility] -We will give you a prescription for rescue inhaler, albuterol. 2 puffs as needed for shortness of breath/wheezing/chest tightness/cough -Continue with diet and exercise -Continue to monitor your leg swelling -Avoid sick contacts

## 2015-07-20 NOTE — Progress Notes (Signed)
MRN# [id number] April [name] [birth year]   CC: Chief Complaint  Patient presents with  . Follow-up    pt. states breathing is baseline. c/o occ SOB, dry cough, wheezing. denies chest pain/tightness.       Brief History: HPI 06/2014 HPI:  Patient is a pleasant 75 year old female presents today for further evaluation of shortness of breath is becoming worse over the past one year. Patient states that she also has dyspnea on exertion, walking from the parking lot to the office today elicited dyspnea, she also states that she cannot climb 1 flight of stairs. Over the last year for shortness of breath has been gradual in nature, she describes the sensation as "winded" and being fatigued. She has a history of a hiatal hernia. Patient was also seen by cardiology, Dr. Candis Musa, workup included cardiac catheterization, which was read as normal. Patient says she has intermittent cough at times which is nonproductive. She cannot identify any factors and the last year that may have caused worsening shortness of breath or any relation to allergies or environmental changes. She has a history of obstructive sleep apnea for which she wears BiPAP nightly since 2012; however over the past 2-3 weeks she has not worn her BiPAP due to having nasal congestion and suspected sinusitis. Patient is a never smoker, but does endorse exposure to secondhand smoke when she lived at home up until the age of 75 years old. Plan - pfts, 43mwt, split night study  ROV 07/2014 Patient presents for a follow up visit today. Had sleep titration study done along with pfts and 19mwt. Currently wearing cpap 5/7 nights.  No worsening sob since her last visit, no recent ED\Urgent care visits.  Plan - cont with cpap, severe hiatal hernia, referral to surgery for large hiatal hernia, cont with exercise as tolerated. Has normal pfts (no obs process).    ROV 10/2014: Patient presents today for follow-up visit of dyspnea on  exertion. Since her last visit she's had a sleep study done, she is wearing her CPAP machine nightly. Overall she is not as tired as she used to be before, but her dyspnea on exertion has not had any significant improvement. She still has a nasal congestion. At her last visit it was determined that one of the sources of dyspnea could be related to her hiatal hernia, she has follow-up with Dr. Jamal Collin who is providing monitoring at this time. Plan: Normal PFTS, 3mwt with only 372 feet. Most likely deconditioning.  Plan  - continue with CPAP - repeat CT chest\abdomen and pelvis for further evaluation of dyspnea and hiatal hernia.  - exercise as tolerated.   ROV 04/2015 Patient presents today for follow-up visit of chronic dyspnea on exertion, chronic dry nonproductive cough and chest pain. Since her last visit she's had endoscopy by Dr. Jamal Collin, biopsy report from the endoscopy showed gastritis no active H. pylori infection no metaplasia. Patient still states that she is short of breath with exertion, she still has moderate nasal congestion but no nasal drainage. She did have 2 cardiac catheterizations in the past most recently being about 3 years ago, she was seen by Dr. Lorinda Creed, but is not requesting to see Dr. Candis Musa with the [facility] cardiology.  Patient also stated today that she's having intermittent episodes of mild sharp chest pain, this is not new, she's had again to prior cardiac catheterizations in the past that showed coronary artery disease not requiring acute intervention. Plan  - continue with CPAP - exercise as tolerated.  -  ENT F\U - Cardiology Referral - Breo 2 week trial - patient to call office for rx if needed.  Events last clinic visit: Patient resents today for follow-up of her chronic dyspnea on exertion, and chronic nonproductive cough. Since her last visit she is follow-up with her primary care physician, in nose and throat, and cardiology. Review of chart shows  that she saw ENT, was having symptoms of sinusitis/URI, was given a course of Levaquin which improved her symptoms. Also her Nexium was adjusted to twice a day dosing. Her primary care physician did discuss changing one of her blood pressure medications, with the patient decided that since her symptoms were improving with her new inhaler and adjustment of her PPI, she decided to not change her blood pressure medications. Patient is on Breo, and states that she feels that this is definitely helping her shortness of breath with exertion and cough. She is also seeing cardiology,Dr. Rockey Situ, who has reviewed her echocardiogram, she is noted to have coronary artery disease, adjusted her cholesterol and advised her to keep her blood pressure under control, she is also given a standing prescription for Lasix as needed when she has leg swelling. Patient stated that she does have some leg swelling of the last 2-3 days and she took a dose of Lasix this morning  Medication:   Current Outpatient Rx  Name  Route  Sig  Dispense  Refill  . aspirin 81 MG tablet   Oral   Take 81 mg by mouth daily.         . calcium carbonate (TUMS - DOSED IN MG ELEMENTAL CALCIUM) 500 MG chewable tablet   Oral   Chew 1 tablet by mouth daily as needed.          . carvedilol (COREG) 25 MG tablet      TAKE ONE TABLET TWICE DAILY WITH A MEAL   60 tablet   3     PT NEEDS REFILLS   . esomeprazole (NEXIUM) 40 MG capsule   Oral   Take 1 capsule (40 mg total) by mouth 2 (two) times daily before a meal.   180 capsule   1   . Ferrous Sulfate Dried (FEOSOL) 200 (65 FE) MG TABS   Oral   Take by mouth.         . fish oil-omega-3 fatty acids 1000 MG capsule   Oral   Take 1 g by mouth daily.         . fluticasone (FLONASE) 50 MCG/ACT nasal spray   Each Nare   Place 2 sprays into both nostrils daily.         . Fluticasone Furoate-Vilanterol 100-25 MCG/INH AEPB   Inhalation   Inhale 1 puff into the lungs daily.    60 each   5   . furosemide (LASIX) 20 MG tablet   Oral   Take 1 tablet (20 mg total) by mouth daily as needed.   30 tablet   11   . losartan-hydrochlorothiazide (HYZAAR) 100-12.5 MG tablet      TAKE ONE (1) TABLET EACH DAY   30 tablet   6     PATIENT NEEDS REFILLS   . meloxicam (MOBIC) 15 MG tablet   Oral   Take 15 mg by mouth daily.         . Misc Natural Products (OSTEO BI-FLEX JOINT SHIELD PO)   Oral   Take by mouth 2 (two) times daily.         Marland Kitchen  Multiple Vitamin (MULTIVITAMIN) tablet   Oral   Take 1 tablet by mouth daily.         . potassium chloride (K-DUR) 10 MEQ tablet   Oral   Take 1 tablet (10 mEq total) by mouth daily as needed.   30 tablet   11   . pravastatin (PRAVACHOL) 40 MG tablet   Oral   Take 1 tablet (40 mg total) by mouth daily.   90 tablet   3   . VITAMIN D, CHOLECALCIFEROL, PO   Oral   Take 1,000 Units by mouth 2 (two) times daily.             Review of Systems: Gen:  Denies  fever, sweats, chills HEENT: Denies blurred vision, double vision, ear pain, eye pain, hearing loss, nose bleeds, sore throat Cvc:  No dizziness, mild intermittent sharp left chest pain at rest.  Resp:   Admits to:sob, doe, dry cough,  (baseline) Gi: Denies swallowing difficulty, stomach pain, nausea or vomiting, diarrhea, constipation, bowel incontinence Gu:  Denies bladder incontinence, burning urine Ext:   Leg swelling Skin: No skin rash, easy bruising or bleeding or hives Endoc:  No polyuria, polydipsia , polyphagia or weight change Other:  All other systems negative  Allergies:  Penicillins and Sulfa antibiotics  Physical Examination:  VS: BP 144/78 mmHg  Pulse 77  Ht 5' (1.524 m)  Wt 182 lb 3.2 oz (82.645 kg)  BMI 35.58 kg/m2  SpO2 97%  LMP 05/01/1972  General Appearance: No distress  HEENT: PERRLA, no ptosis, no other lesions noticed Pulmonary:normal breath sounds., diaphragmatic excursion normal.No wheezing, No rales   Cardiovascular:   Normal S1,S2.  No m/r/g.     Abdomen:Exam: Benign, Soft, non-tender, No masses  Skin:   warm, no rashes, no ecchymosis  Extremities: normal, no cyanosis, clubbing, warm with normal capillary refill.  1+ bilateral lower extremity edema pitting      Assessment and Plan: 75 year old female seen in follow-up for dyspnea on exertion DOE (dyspnea on exertion) I believe that her dyspnea on exertion is multifactorial in nature due to factors such as: Deconditioning, obstructive sleep apnea, obesity, hiatal hernia, coronary artery disease, cardiac megaly, nasal congestion/allergies She now with improvement since being on Breo and adjusting her PPI   Normal PFTS, 97mwt with only 372 feet.   Patient still with dyspnea on exertion/shortness of breath at rest, but it has improved significantly since she has doubled her Nexium dose and started on Breo. He has been seen by ENT, and is being managed supportively She has also been seen by cardiology, they have reviewed her echocardiogram and her history of coronary artery disease, at this time is no intervention needed, she was given Lasix as needed for increased weight/leg swelling.  Plan  - continue with CPAP - exercise as tolerated.  - cont with Breo - albuterol PRN sob/wheezing/cough            Updated Medication List Outpatient Encounter Prescriptions as of 07/20/2015  Medication Sig  . aspirin 81 MG tablet Take 81 mg by mouth daily.  . calcium carbonate (TUMS - DOSED IN MG ELEMENTAL CALCIUM) 500 MG chewable tablet Chew 1 tablet by mouth daily as needed.   . carvedilol (COREG) 25 MG tablet TAKE ONE TABLET TWICE DAILY WITH A MEAL  . esomeprazole (NEXIUM) 40 MG capsule Take 1 capsule (40 mg total) by mouth 2 (two) times daily before a meal.  . Ferrous Sulfate Dried (FEOSOL) 200 (65 FE) MG TABS  Take by mouth.  . fish oil-omega-3 fatty acids 1000 MG capsule Take 1 g by mouth daily.  . fluticasone (FLONASE) 50 MCG/ACT nasal spray Place 2  sprays into both nostrils daily.  . Fluticasone Furoate-Vilanterol 100-25 MCG/INH AEPB Inhale 1 puff into the lungs daily.  . furosemide (LASIX) 20 MG tablet Take 1 tablet (20 mg total) by mouth daily as needed.  Marland Kitchen losartan-hydrochlorothiazide (HYZAAR) 100-12.5 MG tablet TAKE ONE (1) TABLET EACH DAY  . meloxicam (MOBIC) 15 MG tablet Take 15 mg by mouth daily.  . Misc Natural Products (OSTEO BI-FLEX JOINT SHIELD PO) Take by mouth 2 (two) times daily.  . Multiple Vitamin (MULTIVITAMIN) tablet Take 1 tablet by mouth daily.  . potassium chloride (K-DUR) 10 MEQ tablet Take 1 tablet (10 mEq total) by mouth daily as needed.  . pravastatin (PRAVACHOL) 40 MG tablet Take 1 tablet (40 mg total) by mouth daily.  Marland Kitchen VITAMIN D, CHOLECALCIFEROL, PO Take 1,000 Units by mouth 2 (two) times daily.    No facility-administered encounter medications on file as of 07/20/2015.    Orders for this visit: No orders of the defined types were placed in this encounter.    Thank  you for the visitation and for allowing  [facility] to assist in the care of your patient. Our recommendations are noted above.  Please contact us if we can be of further service.  Vilinda Boehringer, MD [facility] Office Number: [phone]

## 2015-07-20 NOTE — Addendum Note (Signed)
Addended by: Oscar La R on: 07/20/2015 12:04 PM   Modules accepted: Orders

## 2015-07-20 NOTE — Assessment & Plan Note (Signed)
I believe that her dyspnea on exertion is multifactorial in nature due to factors such as: Deconditioning, obstructive sleep apnea, obesity, hiatal hernia, coronary artery disease, cardiac megaly, nasal congestion/allergies She now with improvement since being on Breo and adjusting her PPI   Normal PFTS, 67mwt with only 372 feet.   Patient still with dyspnea on exertion/shortness of breath at rest, but it has improved significantly since she has doubled her Nexium dose and started on Breo. He has been seen by ENT, and is being managed supportively She has also been seen by cardiology, they have reviewed her echocardiogram and her history of coronary artery disease, at this time is no intervention needed, she was given Lasix as needed for increased weight/leg swelling.  Plan  - continue with CPAP - exercise as tolerated.  - cont with Breo - albuterol PRN sob/wheezing/cough

## 2015-07-31 HISTORY — PX: NM MYOVIEW (ARMC HX): HXRAD1857

## 2015-08-12 ENCOUNTER — Encounter: Payer: Self-pay | Admitting: Internal Medicine

## 2015-08-12 ENCOUNTER — Other Ambulatory Visit
Admission: RE | Admit: 2015-08-12 | Discharge: 2015-08-12 | Disposition: A | Discharge status: Home / Self Care | Payer: Medicare Other | Source: Ambulatory Visit | Attending: Internal Medicine | Admitting: Internal Medicine

## 2015-08-12 ENCOUNTER — Other Ambulatory Visit: Payer: Self-pay

## 2015-08-12 ENCOUNTER — Ambulatory Visit (INDEPENDENT_AMBULATORY_CARE_PROVIDER_SITE_OTHER): Payer: Medicare Other | Admitting: Internal Medicine

## 2015-08-12 VITALS — BP 140/80 | HR 66 | Temp 98.5°F | Resp 18 | Ht 60.0 in | Wt 181.5 lb

## 2015-08-12 DIAGNOSIS — I2510 Atherosclerotic heart disease of native coronary artery without angina pectoris: Secondary | ICD-10-CM

## 2015-08-12 DIAGNOSIS — I10 Essential (primary) hypertension: Secondary | ICD-10-CM | POA: Diagnosis not present

## 2015-08-12 DIAGNOSIS — I209 Angina pectoris, unspecified: Secondary | ICD-10-CM

## 2015-08-12 DIAGNOSIS — G4733 Obstructive sleep apnea (adult) (pediatric): Secondary | ICD-10-CM

## 2015-08-12 DIAGNOSIS — M79602 Pain in left arm: Secondary | ICD-10-CM | POA: Diagnosis not present

## 2015-08-12 DIAGNOSIS — E119 Type 2 diabetes mellitus without complications: Secondary | ICD-10-CM

## 2015-08-12 DIAGNOSIS — R0602 Shortness of breath: Secondary | ICD-10-CM | POA: Diagnosis not present

## 2015-08-12 DIAGNOSIS — K449 Diaphragmatic hernia without obstruction or gangrene: Secondary | ICD-10-CM

## 2015-08-12 DIAGNOSIS — Z658 Other specified problems related to psychosocial circumstances: Secondary | ICD-10-CM

## 2015-08-12 DIAGNOSIS — R079 Chest pain, unspecified: Secondary | ICD-10-CM | POA: Diagnosis not present

## 2015-08-12 DIAGNOSIS — I25119 Atherosclerotic heart disease of native coronary artery with unspecified angina pectoris: Secondary | ICD-10-CM

## 2015-08-12 DIAGNOSIS — F439 Reaction to severe stress, unspecified: Secondary | ICD-10-CM

## 2015-08-12 DIAGNOSIS — K219 Gastro-esophageal reflux disease without esophagitis: Secondary | ICD-10-CM

## 2015-08-12 LAB — TROPONIN I

## 2015-08-12 NOTE — Progress Notes (Signed)
Pre-visit discussion using our clinic review tool. No additional management support is needed unless otherwise documented below in the visit note.  

## 2015-08-12 NOTE — Patient Instructions (Signed)
April [name] is scheduled for a lexiscan myoview tomorrow @ [facility] @ 9:00 am.   Here are the instructions if you would to copy & paste into her AVS.   [place]   Your caregiver has ordered a Stress Test with nuclear imaging. The purpose of this test is to evaluate the blood supply to your heart muscle. This procedure is referred to as a "Non-Invasive Stress Test." This is because other than having an IV started in your vein, nothing is inserted or "invades" your body. Cardiac stress tests are done to find areas of poor blood flow to the heart by determining the extent of coronary artery disease (CAD). Some patients exercise on a treadmill, which naturally increases the blood flow to your heart, while others who are unable to walk on a treadmill due to physical limitations have a pharmacologic/chemical stress agent called Lexiscan . This medicine will mimic walking on a treadmill by temporarily increasing your coronary blood flow.   Please note: these test may take anywhere between 2-4 hours to complete   PLEASE REPORT TO [place] AT THE FIRST DESK WILL DIRECT YOU WHERE TO GO   Date of Procedure:____Friday, April 14___________   Arrival Time for Procedure:___8:45 am____________   Instructions regarding medication:   __X__: Hold COREG the night before and morning of procedure   How to prepare for your Myoview test:   Do not eat or drink after midnight  No caffeine for 24 hours prior to test  No smoking 24 hours prior to test.  Your medication may be taken with water. If your doctor stopped a medication because of this test, do not take that medication.  Ladies, please do not wear dresses. Skirts or pants are appropriate. Please wear a short sleeve shirt.  No perfume, cologne or lotion.

## 2015-08-12 NOTE — Progress Notes (Signed)
Patient ID: April [name], female   DOB: [birth year], 75 y.o.   MRN: [id number]   Subjective:    Patient ID: April [name], female    DOB: August 19, 1940, 75 y.o.   MRN: [id number]  HPI  Patient here for a scheduled follow up.  She reports increased stress recently.  Daughter just recently passed away.  Discussed with her today.  She has good support.  Does not feel needs any further intervention at this time.  Will let me know if needs anything more.  She has been using breo.  Taking PPI twice a day.  Breathing overall is better.  She still gets winded with exertion, but breathing when she is sitting, etc - is better.  She has also noticed some left arm heaviness.  She has also noticed some chest pain - pin prick in her chest.  The left arm heaviness has been present for the last two days.  More constant.  No acid reflux.  No abdominal pain or cramping.  Bowels stable.     Past Medical History  Diagnosis Date  . Hypertension   . Hypercholesterolemia   . History of colon polyps 08/1994  . Osteoporosis   . Sleep apnea     CPAP  . Arrhythmia     history of an irregular heart beat  . Diverticulitis   . Urine incontinence   . Rheumatic fever   . Migraines     history of migraines  . Endometriosis   . Hiatal hernia   . Heart murmur    Past Surgical History  Procedure Laterality Date  . Abdominal hysterectomy      ovaries not removed  . Tonsillectomy    . Breast biopsies      x2  . Appendectomy      was removed during hysterectomy  . Breast surgery Bilateral   . Colonoscopy  2013  . Esophagogastroduodenoscopy (egd) with propofol N/A 03/17/2015    Procedure: ESOPHAGOGASTRODUODENOSCOPY (EGD) WITH PROPOFOL;  Surgeon: Christene Lye, MD;  Location: [facility] ENDOSCOPY;  Service: Gastroenterology;  Laterality: N/A;  . Coronary angioplasty  2012  . Breast biopsy Bilateral "years ago"   Family History  Problem Relation Age of Onset  . Arthritis Mother   . Stroke Mother   .  Hypertension Mother   . Osteoporosis Mother   . Heart disease Father     MI  . Heart attack Father   . Stroke Brother   . Breast cancer      first cousin x 2  . Colon cancer Neg Hx    Social History   Social History  . Marital Status: Widowed    Spouse Name: N/A  . Number of Children: 2  . Years of Education: N/A   Social History Main Topics  . Smoking status: Never Smoker   . Smokeless tobacco: Never Used  . Alcohol Use: No  . Drug Use: No  . Sexual Activity: Not Asked   Other Topics Concern  . None   Social History Narrative    Outpatient Encounter Prescriptions as of 08/12/2015  Medication Sig  . albuterol (PROVENTIL HFA;VENTOLIN HFA) 108 (90 Base) MCG/ACT inhaler Inhale 2 puffs into the lungs every 4 (four) hours as needed for wheezing or shortness of breath.  Marland Kitchen aspirin 81 MG tablet Take 81 mg by mouth daily.  . calcium carbonate (TUMS - DOSED IN MG ELEMENTAL CALCIUM) 500 MG chewable tablet Chew 1 tablet by mouth daily as needed.   Marland Kitchen  carvedilol (COREG) 25 MG tablet TAKE ONE TABLET TWICE DAILY WITH A MEAL  . esomeprazole (NEXIUM) 40 MG capsule Take 1 capsule (40 mg total) by mouth 2 (two) times daily before a meal.  . Ferrous Sulfate Dried (FEOSOL) 200 (65 FE) MG TABS Take by mouth.  . fish oil-omega-3 fatty acids 1000 MG capsule Take 1 g by mouth daily.  . fluticasone (FLONASE) 50 MCG/ACT nasal spray Place 2 sprays into both nostrils daily.  . fluticasone furoate-vilanterol (BREO ELLIPTA) 100-25 MCG/INH AEPB Inhale 1 puff into the lungs daily.  . furosemide (LASIX) 20 MG tablet Take 1 tablet (20 mg total) by mouth daily as needed.  Marland Kitchen losartan-hydrochlorothiazide (HYZAAR) 100-12.5 MG tablet TAKE ONE (1) TABLET EACH DAY  . meloxicam (MOBIC) 15 MG tablet Take 15 mg by mouth daily.  . Misc Natural Products (OSTEO BI-FLEX JOINT SHIELD PO) Take by mouth 2 (two) times daily.  . Multiple Vitamin (MULTIVITAMIN) tablet Take 1 tablet by mouth daily.  . potassium chloride  (K-DUR) 10 MEQ tablet Take 1 tablet (10 mEq total) by mouth daily as needed.  . pravastatin (PRAVACHOL) 40 MG tablet Take 1 tablet (40 mg total) by mouth daily.  Marland Kitchen VITAMIN D, CHOLECALCIFEROL, PO Take 1,000 Units by mouth 2 (two) times daily.    No facility-administered encounter medications on file as of 08/12/2015.    Review of Systems  Constitutional: Negative for appetite change and unexpected weight change.  HENT: Negative for congestion and sinus pressure.   Respiratory: Positive for shortness of breath (sob with exertion. ). Negative for cough and chest tightness.   Cardiovascular: Positive for chest pain. Negative for palpitations and leg swelling.  Gastrointestinal: Negative for nausea, vomiting, abdominal pain and diarrhea.  Genitourinary: Negative for dysuria and difficulty urinating.  Musculoskeletal: Negative for back pain and joint swelling.       Left arm heaviness as outlined.   Skin: Negative for color change and rash.  Neurological: Negative for dizziness, light-headedness and headaches.  Psychiatric/Behavioral: Negative for dysphoric mood and agitation.       Objective:    Physical Exam  Constitutional: She appears well-developed and well-nourished. No distress.  HENT:  Nose: Nose normal.  Mouth/Throat: Oropharynx is clear and moist.  Neck: Neck supple. No thyromegaly present.  Cardiovascular: Normal rate and regular rhythm.   Pulmonary/Chest: Breath sounds normal. No respiratory distress. She has no wheezes.  Abdominal: Soft. Bowel sounds are normal. There is no tenderness.  Musculoskeletal: She exhibits no edema or tenderness.  Lymphadenopathy:    She has no cervical adenopathy.  Skin: No rash noted. No erythema.  Psychiatric: She has a normal mood and affect. Her behavior is normal.    BP 140/80 mmHg  Pulse 66  Temp(Src) 98.5 F (36.9 C) (Oral)  Resp 18  Ht 5' (1.524 m)  Wt 181 lb 8 oz (82.328 kg)  BMI 35.45 kg/m2  SpO2 96%  LMP 05/01/1972 Wt  Readings from Last 3 Encounters:  08/12/15 181 lb 8 oz (82.328 kg)  07/20/15 182 lb 3.2 oz (82.645 kg)  07/06/15 181 lb (82.101 kg)     Lab Results  Component Value Date   WBC 5.7 06/17/2015   HGB 13.3 06/17/2015   HCT 40.2 06/17/2015   PLT 216.0 06/17/2015   GLUCOSE 102* 06/17/2015   CHOL 195 06/17/2015   TRIG 79.0 06/17/2015   HDL 65.40 06/17/2015   LDLCALC 114* 06/17/2015   ALT 18 06/17/2015   AST 25 06/17/2015   NA 142 06/17/2015  K 4.7 06/17/2015   CL 103 06/17/2015   CREATININE 0.68 06/17/2015   BUN 20 06/17/2015   CO2 31 06/17/2015   TSH 1.53 02/05/2015   HGBA1C 6.5 06/17/2015   MICROALBUR 4.2* 06/17/2015        Assessment & Plan:   Problem List Items Addressed This Visit    CAD (coronary artery disease)    40% RCA disease noted on prior cardiac cath.  With the left arm heaviness, concern over possible cardiac origin.  EKG obtained and revealed SR with no acute ischemic changes.  Discussed with Dr Rockey Situ.  Troponin negative.  Plan for stress test tomorrow.  Discussed with pt.  She declined ER evaluation today.  If any change in symptoms, she is to be evaluated immediately.        Chest pain    EKG as outlined.  Plan cardiac w/up as outlined.        Relevant Orders   Troponin I   Diabetes mellitus (HCC)    Low carbohydrate diet.  Follow met b and a1c.        Essential hypertension, benign    Blood pressure as outlined.  Continue same medication regimen.  Follow pressures.  Follow metabolic panel.        GERD (gastroesophageal reflux disease)    On nexium twice a day.  Controlled.        Hiatal hernia    Has a large hiatal hernia.  No nexium bid now.  Breathing better.  Follow.       Obstructive sleep apnea    Continue CPAP.       SOB (shortness of breath)    Seeing pulmonary.  See last note.  On breo.  Overall breathing is better.  Still some sob with exertion.  Stress test planned as outlined.  Feel the DOE is multifactorial.        Stress     Trying to deal with the death of her daughter.  Discussed with her today.  She does not feel she needs anything more at this point.  Follow.         Other Visit Diagnoses    Left arm pain    -  Primary    Relevant Orders    EKG 12-Lead (Completed)    Troponin I        Einar Pheasant, MD

## 2015-08-13 ENCOUNTER — Telehealth: Payer: Self-pay | Admitting: Cardiovascular Disease

## 2015-08-13 ENCOUNTER — Encounter
Admission: RE | Admit: 2015-08-13 | Discharge: 2015-08-13 | Disposition: A | Discharge status: Home / Self Care | Payer: Medicare Other | Source: Ambulatory Visit | Attending: Cardiovascular Disease | Admitting: Cardiovascular Disease

## 2015-08-13 DIAGNOSIS — I25119 Atherosclerotic heart disease of native coronary artery with unspecified angina pectoris: Secondary | ICD-10-CM | POA: Insufficient documentation

## 2015-08-13 DIAGNOSIS — I209 Angina pectoris, unspecified: Secondary | ICD-10-CM | POA: Insufficient documentation

## 2015-08-13 DIAGNOSIS — R079 Chest pain, unspecified: Secondary | ICD-10-CM | POA: Diagnosis not present

## 2015-08-13 LAB — NM MYOCAR MULTI W/SPECT W/WALL MOTION / EF
CSEPHR: 68 %
Estimated workload: 1 METS
Exercise duration (min): 0 min
Exercise duration (sec): 0 s
LV sys vol: 17 mL
LVDIAVOL: 70 mL (ref 46–106)
MPHR: 144 {beats}/min
Peak HR: 99 {beats}/min
Rest HR: 64 {beats}/min
SDS: 0
SRS: 8
SSS: 3
TID: 1.07

## 2015-08-13 MED ORDER — TECHNETIUM TC 99M SESTAMIBI - CARDIOLITE
29.5280 | Freq: Once | INTRAVENOUS | Status: AC | PRN
  Administered 2015-08-13: 29.528 via INTRAVENOUS

## 2015-08-13 MED ORDER — TECHNETIUM TC 99M SESTAMIBI - CARDIOLITE
13.4400 | Freq: Once | INTRAVENOUS | Status: AC | PRN
  Administered 2015-08-13: 09:00:00 13.44 via INTRAVENOUS

## 2015-08-13 MED ORDER — REGADENOSON 0.4 MG/5ML IV SOLN
0.4000 mg | Freq: Once | INTRAVENOUS | Status: AC
  Administered 2015-08-13: 0.4 mg via INTRAVENOUS

## 2015-08-13 NOTE — Telephone Encounter (Signed)
S/w pt regarding stress test results. Confirmed May appt w/Dr. Rockey Situ. Pt verbalized understanding with no questions.

## 2015-08-13 NOTE — Telephone Encounter (Signed)
Can we do the patient know her stress test done today April 14 showed no ischemia, overall look good  She can follow-up with me in clinic if she feels I can be of service.  Overall very reassuring   thx  TG     Attempted to contact pt regarding results. Unable to leave VM on home phone as memory is full. Left message on pt cell VM to call the office.

## 2015-08-14 ENCOUNTER — Telehealth: Payer: Self-pay | Admitting: Internal Medicine

## 2015-08-14 NOTE — Telephone Encounter (Signed)
----- Message from Minna Merritts, MD sent at 08/13/2015  2:58 PM EDT ----- Regarding: RE: Lexiscan Myoview Can we do the patient know her stress test done today April 14 showed no ischemia, overall look good She can follow-up with me in clinic if she feels I can  be of service. Overall very reassuring  thx TG  ----- Message -----    From: Stana Bunting, RN    Sent: 08/12/2015   2:18 PM      To: Minna Merritts, MD Subject: Melton AlarCarlton Adam Myoview                             ----- Message -----    From: Einar Pheasant, MD    Sent: 08/12/2015   1:35 PM      To: Stana Bunting, RN Subject: RE: Carlton Adam Myoview                           Please let Dr Rockey Situ know that Ms Betzler's troponin was ok.  Thank you for your help.  I appreciate it.    Dr Nicki Reaper ----- Message -----    From: Stana Bunting, RN    Sent: 08/12/2015  11:34 AM      To: Einar Pheasant, MD Subject: Leane Call                               Dr. Nicki Reaper, I'm currently on hold trying to get through to your office.  Mrs. Hall is scheduled for a Lexicographer tomorrow @ [facility] @ 9:00 am.   Here are the instructions if you would to copy & paste into her AVS.  [place]  Your caregiver has ordered a Stress Test with nuclear imaging. The purpose of this test is to evaluate the blood supply to your heart muscle. This procedure is referred to as a "Non-Invasive Stress Test." This is because other than having an IV started in your vein, nothing is inserted or "invades" your body. Cardiac stress tests are done to find areas of poor blood flow to the heart by determining the extent of coronary artery disease (CAD). Some patients exercise on a treadmill, which naturally increases the blood flow to your heart, while others who are  unable to walk on a treadmill due to physical limitations have a pharmacologic/chemical stress agent called Lexiscan . This medicine will mimic walking on a treadmill by temporarily increasing your  coronary blood flow.   Please note: these test may take anywhere between 2-4 hours to complete  PLEASE REPORT TO [place] AT THE FIRST DESK WILL DIRECT YOU WHERE TO GO  Date of Procedure:____Friday, April 14___________  Arrival Time for Procedure:___8:45 am____________  Instructions regarding medication:   __X__:  Hold COREG the night before and morning of procedure  How to prepare for your Myoview test:  Do not eat or drink after midnight No caffeine for 24 hours prior to test No smoking 24 hours prior to test. Your medication may be taken with water.  If your doctor stopped a medication because of this test, do not take that medication. Ladies, please do not wear dresses.  Skirts or pants are appropriate. Please wear a short sleeve shirt. No perfume, cologne or lotion.

## 2015-08-14 NOTE — Telephone Encounter (Signed)
Pt notified stress test ok.  She is feeling some better.  Will let us or Dr Rockey Situ know if any problems.

## 2015-08-15 ENCOUNTER — Encounter: Payer: Self-pay | Admitting: Internal Medicine

## 2015-08-15 NOTE — Assessment & Plan Note (Signed)
On nexium twice a day.  Controlled.

## 2015-08-15 NOTE — Assessment & Plan Note (Signed)
EKG as outlined.  Plan cardiac w/up as outlined.

## 2015-08-15 NOTE — Assessment & Plan Note (Signed)
Seeing pulmonary.  See last note.  On breo.  Overall breathing is better.  Still some sob with exertion.  Stress test planned as outlined.  Feel the DOE is multifactorial.

## 2015-08-15 NOTE — Assessment & Plan Note (Signed)
Blood pressure as outlined.  Continue same medication regimen.  Follow pressures.  Follow metabolic panel.  

## 2015-08-15 NOTE — Assessment & Plan Note (Signed)
40% RCA disease noted on prior cardiac cath.  With the left arm heaviness, concern over possible cardiac origin.  EKG obtained and revealed SR with no acute ischemic changes.  Discussed with Dr Rockey Situ.  Troponin negative.  Plan for stress test tomorrow.  Discussed with pt.  She declined ER evaluation today.  If any change in symptoms, she is to be evaluated immediately.

## 2015-08-15 NOTE — Assessment & Plan Note (Signed)
Continue CPAP.  

## 2015-08-15 NOTE — Assessment & Plan Note (Signed)
Low carbohydrate diet.  Follow met b and a1c.

## 2015-08-15 NOTE — Assessment & Plan Note (Signed)
Trying to deal with the death of her daughter.  Discussed with her today.  She does not feel she needs anything more at this point.  Follow.

## 2015-08-15 NOTE — Assessment & Plan Note (Signed)
Has a large hiatal hernia.  No nexium bid now.  Breathing better.  Follow.

## 2015-08-30 ENCOUNTER — Ambulatory Visit: Payer: Medicare Other | Admitting: Cardiovascular Disease

## 2015-09-13 ENCOUNTER — Encounter: Payer: Self-pay | Admitting: Cardiovascular Disease

## 2015-09-13 ENCOUNTER — Ambulatory Visit (INDEPENDENT_AMBULATORY_CARE_PROVIDER_SITE_OTHER): Payer: Medicare Other | Admitting: Cardiovascular Disease

## 2015-09-13 VITALS — BP 122/66 | HR 68 | Ht 61.0 in | Wt 180.5 lb

## 2015-09-13 DIAGNOSIS — R0600 Dyspnea, unspecified: Secondary | ICD-10-CM

## 2015-09-13 DIAGNOSIS — E1159 Type 2 diabetes mellitus with other circulatory complications: Secondary | ICD-10-CM

## 2015-09-13 DIAGNOSIS — I209 Angina pectoris, unspecified: Secondary | ICD-10-CM | POA: Diagnosis not present

## 2015-09-13 DIAGNOSIS — E7800 Pure hypercholesterolemia, unspecified: Secondary | ICD-10-CM

## 2015-09-13 DIAGNOSIS — I10 Essential (primary) hypertension: Secondary | ICD-10-CM

## 2015-09-13 DIAGNOSIS — R0609 Other forms of dyspnea: Secondary | ICD-10-CM

## 2015-09-13 DIAGNOSIS — I2510 Atherosclerotic heart disease of native coronary artery without angina pectoris: Secondary | ICD-10-CM | POA: Diagnosis not present

## 2015-09-13 NOTE — Assessment & Plan Note (Signed)
We have encouraged continued exercise, careful diet management in an effort to lose weight. 

## 2015-09-13 NOTE — Progress Notes (Signed)
Patient ID: April [name], female    DOB: 12-12-1940, 75 y.o.   MRN: [id number]  HPI Comments: April [name] a pleasant 75 year old woman with history of coronary artery disease, 40% proximal RCA disease on cardiac catheterization in 01/2010, chronic shortness of breath who presents  For routine follow-up of her shortness of breath several cardiac catheterizations over the years, last in 2011 showing very mild nonobstructive proximal RCA disease.  CT scan showing no coronary calcification seen in the LAD or left circumflex, moderate calcification seen in the proximal RCA ( moderate hiatal hernia) Mild aortic atherosclerosis seen.  recent stress test 08/13/2015 showing no ischemia   In follow-up, she reports that  She lost her daughter one month ago , possibly sudden cardiac death though etiology unclear  Daughter had low ejection fraction 30, 40%,  Was seen by cardiology  She continues to adjust to her passing,  Scheduled to move into [place]  Denies any new symptoms of chest pain or shortness of breath, feels her previous symptoms were secondary to profound stress after loss of her daughter  she does take Lasix periodically for ankle swelling, shortness of breath  she is eager to start exercise program [facility]  no recent lipid panel since pravastatin dose increased up to 40 mg daily   EKG on today's visit shows normal sinus rhythm with rate 69 bpm, poor R-wave progression to the anterior precordial leads, APCs noted, left axis deviation   Other past medical history Echocardiogram in 2015 showing normal ejection fraction, borderline elevated right ventricular systolic pressures, diastolic relaxation abnormality.   Allergies  Allergen Reactions  . Penicillins     Okay to take amoxicillin   . Sulfa Antibiotics     Current Outpatient Prescriptions on File Prior to Visit  Medication Sig Dispense Refill  . albuterol (PROVENTIL HFA;VENTOLIN HFA) 108 (90 Base)  MCG/ACT inhaler Inhale 2 puffs into the lungs every 4 (four) hours as needed for wheezing or shortness of breath. 1 Inhaler 6  . aspirin 81 MG tablet Take 81 mg by mouth daily.    . calcium carbonate (TUMS - DOSED IN MG ELEMENTAL CALCIUM) 500 MG chewable tablet Chew 1 tablet by mouth daily as needed.     . carvedilol (COREG) 25 MG tablet TAKE ONE TABLET TWICE DAILY WITH A MEAL 60 tablet 3  . esomeprazole (NEXIUM) 40 MG capsule Take 1 capsule (40 mg total) by mouth 2 (two) times daily before a meal. 180 capsule 1  . Ferrous Sulfate Dried (FEOSOL) 200 (65 FE) MG TABS Take by mouth.    . fish oil-omega-3 fatty acids 1000 MG capsule Take 1 g by mouth daily.    . fluticasone (FLONASE) 50 MCG/ACT nasal spray Place 2 sprays into both nostrils daily.    . fluticasone furoate-vilanterol (BREO ELLIPTA) 100-25 MCG/INH AEPB Inhale 1 puff into the lungs daily. 60 each 5  . furosemide (LASIX) 20 MG tablet Take 1 tablet (20 mg total) by mouth daily as needed. 30 tablet 11  . losartan-hydrochlorothiazide (HYZAAR) 100-12.5 MG tablet TAKE ONE (1) TABLET EACH DAY 30 tablet 6  . meloxicam (MOBIC) 15 MG tablet Take 15 mg by mouth daily as needed.     . Misc Natural Products (OSTEO BI-FLEX JOINT SHIELD PO) Take by mouth 2 (two) times daily.    . Multiple Vitamin (MULTIVITAMIN) tablet Take 1 tablet by mouth daily.    . potassium chloride (K-DUR) 10 MEQ tablet Take 1 tablet (10 mEq  total) by mouth daily as needed. 30 tablet 11  . pravastatin (PRAVACHOL) 40 MG tablet Take 1 tablet (40 mg total) by mouth daily. 90 tablet 3  . VITAMIN D, CHOLECALCIFEROL, PO Take 1,000 Units by mouth 2 (two) times daily.      No current facility-administered medications on file prior to visit.    Past Medical History  Diagnosis Date  . Hypertension   . Hypercholesterolemia   . History of colon polyps 08/1994  . Osteoporosis   . Sleep apnea     CPAP  . Arrhythmia     history of an irregular heart beat  . Diverticulitis   . Urine  incontinence   . Rheumatic fever   . Migraines     history of migraines  . Endometriosis   . Hiatal hernia   . Heart murmur     Past Surgical History  Procedure Laterality Date  . Abdominal hysterectomy      ovaries not removed  . Tonsillectomy    . Breast biopsies      x2  . Appendectomy      was removed during hysterectomy  . Breast surgery Bilateral   . Colonoscopy  2013  . Esophagogastroduodenoscopy (egd) with propofol N/A 03/17/2015    Procedure: ESOPHAGOGASTRODUODENOSCOPY (EGD) WITH PROPOFOL;  Surgeon: Christene Lye, MD;  Location: [facility] ENDOSCOPY;  Service: Gastroenterology;  Laterality: N/A;  . Coronary angioplasty  2012  . Breast biopsy Bilateral "years ago"    Social History  reports that she has never smoked. She has never used smokeless tobacco. She reports that she does not drink alcohol or use illicit drugs.  Family History family history includes Arthritis in her mother; Heart attack in her father; Heart disease in her father; Hypertension in her mother; Osteoporosis in her mother; Stroke in her brother and mother. There is no history of Colon cancer.   Review of Systems  Constitutional: Negative.   Respiratory: Positive for shortness of breath.   Cardiovascular: Negative.   Gastrointestinal: Negative.   Musculoskeletal: Negative.   Neurological: Negative.   Hematological: Negative.   Psychiatric/Behavioral: Negative.   All other systems reviewed and are negative.   BP 122/66 mmHg  Pulse 68  Ht 5\' 1"  (1.549 m)  Wt 180 lb 8 oz (81.874 kg)  BMI 34.12 kg/m2  LMP 05/01/1972  Physical Exam  Constitutional: She is oriented to person, place, and time. She appears well-developed and well-nourished.  Obese  HENT:  Head: Normocephalic.  Nose: Nose normal.  Mouth/Throat: Oropharynx is clear and moist.  Eyes: Conjunctivae are normal. Pupils are equal, round, and reactive to light.  Neck: Normal range of motion. Neck supple. No JVD present.   Cardiovascular: Normal rate, regular rhythm, normal heart sounds and intact distal pulses.  Exam reveals no gallop and no friction rub.   No murmur heard. Pulmonary/Chest: Effort normal and breath sounds normal. No respiratory distress. She has no wheezes. She has no rales. She exhibits no tenderness.  Abdominal: Soft. Bowel sounds are normal. She exhibits no distension. There is no tenderness.  Musculoskeletal: Normal range of motion. She exhibits no edema or tenderness.  Lymphadenopathy:    She has no cervical adenopathy.  Neurological: She is alert and oriented to person, place, and time. Coordination normal.  Skin: Skin is warm and dry. No rash noted. No erythema.  Psychiatric: She has a normal mood and affect. Her behavior is normal. Judgment and thought content normal.

## 2015-09-13 NOTE — Assessment & Plan Note (Signed)
Blood pressure is well controlled on today's visit. No changes made to the medications. 

## 2015-09-13 NOTE — Assessment & Plan Note (Signed)
Recommended we weight on any medication changes until repeat lipid panel  Could add Zetia  10 mg daily if needed  Ideally goal total cholesterol less than 150

## 2015-09-13 NOTE — Assessment & Plan Note (Signed)
Etiology of her shortness of breath is likely secondary to conditioning  No ischemia on recent stress test  no further testing needed, recommended regular exercise program

## 2015-09-13 NOTE — Patient Instructions (Signed)
You are doing well. No medication changes were made.  Please call us if you have new issues that need to be addressed before your next appt.  Your physician wants you to follow-up in: 12 months.  You will receive a reminder letter in the mail two months in advance. If you don't receive a letter, please call our office to schedule the follow-up appointment. 

## 2015-09-13 NOTE — Assessment & Plan Note (Signed)
Currently with no symptoms of angina. No further workup at this time. Continue current medication regimen. 

## 2015-09-29 ENCOUNTER — Other Ambulatory Visit: Payer: Self-pay | Admitting: Internal Medicine

## 2015-09-30 ENCOUNTER — Other Ambulatory Visit: Payer: Self-pay | Admitting: Internal Medicine

## 2015-09-30 DIAGNOSIS — I10 Essential (primary) hypertension: Secondary | ICD-10-CM

## 2015-09-30 DIAGNOSIS — M810 Age-related osteoporosis without current pathological fracture: Secondary | ICD-10-CM

## 2015-09-30 DIAGNOSIS — E1159 Type 2 diabetes mellitus with other circulatory complications: Secondary | ICD-10-CM

## 2015-09-30 DIAGNOSIS — E7800 Pure hypercholesterolemia, unspecified: Secondary | ICD-10-CM

## 2015-09-30 NOTE — Progress Notes (Signed)
Order placed for labs.

## 2015-10-01 ENCOUNTER — Other Ambulatory Visit (INDEPENDENT_AMBULATORY_CARE_PROVIDER_SITE_OTHER): Payer: Medicare Other

## 2015-10-01 DIAGNOSIS — E1159 Type 2 diabetes mellitus with other circulatory complications: Secondary | ICD-10-CM

## 2015-10-01 DIAGNOSIS — M810 Age-related osteoporosis without current pathological fracture: Secondary | ICD-10-CM | POA: Diagnosis not present

## 2015-10-01 DIAGNOSIS — E7800 Pure hypercholesterolemia, unspecified: Secondary | ICD-10-CM

## 2015-10-01 LAB — BASIC METABOLIC PANEL
BUN: 18 mg/dL (ref 6–23)
CALCIUM: 9.6 mg/dL (ref 8.4–10.5)
CO2: 31 meq/L (ref 19–32)
CREATININE: 0.78 mg/dL (ref 0.40–1.20)
Chloride: 106 mEq/L (ref 96–112)
GFR: 76.13 mL/min (ref >60.00)
GLUCOSE: 100 mg/dL — AB (ref 70–99)
Potassium: 4.2 mEq/L (ref 3.5–5.1)
Sodium: 146 mEq/L — ABNORMAL HIGH (ref 135–145)

## 2015-10-01 LAB — LIPID PANEL
CHOLESTEROL: 180 mg/dL (ref 0–200)
HDL: 63.8 mg/dL (ref >39.00)
LDL CALC: 101 mg/dL — AB (ref 0–99)
NonHDL: 116.34
Total CHOL/HDL Ratio: 3
Triglycerides: 77 mg/dL (ref 0.0–149.0)
VLDL: 15.4 mg/dL (ref 0.0–40.0)

## 2015-10-01 LAB — HEPATIC FUNCTION PANEL
ALK PHOS: 61 U/L (ref 39–117)
ALT: 14 U/L (ref 0–35)
AST: 20 U/L (ref 0–37)
Albumin: 4 g/dL (ref 3.5–5.2)
Bilirubin, Direct: 0.1 mg/dL (ref 0.0–0.3)
Total Bilirubin: 0.7 mg/dL (ref 0.2–1.2)
Total Protein: 6.4 g/dL (ref 6.0–8.3)

## 2015-10-01 LAB — HEMOGLOBIN A1C: Hgb A1c MFr Bld: 6.1 % (ref 4.6–6.5)

## 2015-10-01 LAB — VITAMIN D 25 HYDROXY (VIT D DEFICIENCY, FRACTURES): VITD: 53 ng/mL (ref 30.00–100.00)

## 2015-10-04 ENCOUNTER — Encounter: Payer: Self-pay | Admitting: *Deleted

## 2015-10-05 NOTE — Progress Notes (Signed)
Pt has been scheduled for July 13 @4 :30

## 2015-11-01 ENCOUNTER — Other Ambulatory Visit: Payer: Self-pay | Admitting: Internal Medicine

## 2015-11-04 DIAGNOSIS — M79643 Pain in unspecified hand: Secondary | ICD-10-CM | POA: Diagnosis not present

## 2015-11-04 DIAGNOSIS — M1711 Unilateral primary osteoarthritis, right knee: Secondary | ICD-10-CM | POA: Diagnosis not present

## 2015-11-11 ENCOUNTER — Ambulatory Visit: Payer: Medicare Other | Admitting: Internal Medicine

## 2015-11-11 ENCOUNTER — Encounter: Payer: Self-pay | Admitting: Internal Medicine

## 2015-11-11 ENCOUNTER — Ambulatory Visit (INDEPENDENT_AMBULATORY_CARE_PROVIDER_SITE_OTHER): Payer: Medicare Other | Admitting: Internal Medicine

## 2015-11-11 VITALS — BP 120/70 | HR 61 | Temp 97.9°F | Resp 18 | Ht 61.0 in | Wt 183.0 lb

## 2015-11-11 DIAGNOSIS — I2510 Atherosclerotic heart disease of native coronary artery without angina pectoris: Secondary | ICD-10-CM

## 2015-11-11 DIAGNOSIS — I209 Angina pectoris, unspecified: Secondary | ICD-10-CM | POA: Diagnosis not present

## 2015-11-11 DIAGNOSIS — K219 Gastro-esophageal reflux disease without esophagitis: Secondary | ICD-10-CM | POA: Diagnosis not present

## 2015-11-11 DIAGNOSIS — F439 Reaction to severe stress, unspecified: Secondary | ICD-10-CM

## 2015-11-11 DIAGNOSIS — Z658 Other specified problems related to psychosocial circumstances: Secondary | ICD-10-CM | POA: Diagnosis not present

## 2015-11-11 DIAGNOSIS — E1159 Type 2 diabetes mellitus with other circulatory complications: Secondary | ICD-10-CM | POA: Diagnosis not present

## 2015-11-11 DIAGNOSIS — G4733 Obstructive sleep apnea (adult) (pediatric): Secondary | ICD-10-CM

## 2015-11-11 DIAGNOSIS — M25561 Pain in right knee: Secondary | ICD-10-CM

## 2015-11-11 DIAGNOSIS — E7800 Pure hypercholesterolemia, unspecified: Secondary | ICD-10-CM

## 2015-11-11 DIAGNOSIS — I10 Essential (primary) hypertension: Secondary | ICD-10-CM

## 2015-11-11 NOTE — Progress Notes (Signed)
Patient ID: April [name], female   DOB: December 01, 1940, 75 y.o.   MRN: [id number]   Subjective:    Patient ID: April [name], female    DOB: Dec 17, 1940, 75 y.o.   MRN: [id number]  HPI  Patient here for a scheduled follow up.  She states she is doing relatively well.  Still dealing with increased stress from her daughter's death.  Has good support.  Does not feel needs anything more at this point.  Discussed her cholesterol.  She is willing to start crestor.  Having some right medial knee pain.  Is better today.  Has been at the beach.  No chest pain.  She feels her breathing is some better.  No acid reflux.  No abdominal pain or cramping.  Bowels stable.     Past Medical History  Diagnosis Date  . Hypertension   . Hypercholesterolemia   . History of colon polyps 08/1994  . Osteoporosis   . Sleep apnea     CPAP  . Arrhythmia     history of an irregular heart beat  . Diverticulitis   . Urine incontinence   . Rheumatic fever   . Migraines     history of migraines  . Endometriosis   . Hiatal hernia   . Heart murmur    Past Surgical History  Procedure Laterality Date  . Abdominal hysterectomy      ovaries not removed  . Tonsillectomy    . Breast biopsies      x2  . Appendectomy      was removed during hysterectomy  . Breast surgery Bilateral   . Colonoscopy  2013  . Esophagogastroduodenoscopy (egd) with propofol N/A 03/17/2015    Procedure: ESOPHAGOGASTRODUODENOSCOPY (EGD) WITH PROPOFOL;  Surgeon: Christene Lye, MD;  Location: [facility] ENDOSCOPY;  Service: Gastroenterology;  Laterality: N/A;  . Coronary angioplasty  2012  . Breast biopsy Bilateral "years ago"   Family History  Problem Relation Age of Onset  . Arthritis Mother   . Stroke Mother   . Hypertension Mother   . Osteoporosis Mother   . Heart disease Father     MI  . Heart attack Father   . Stroke Brother   . Breast cancer      first cousin x 2  . Colon cancer Neg Hx    Social History   Social  History  . Marital Status: Widowed    Spouse Name: N/A  . Number of Children: 2  . Years of Education: N/A   Social History Main Topics  . Smoking status: Never Smoker   . Smokeless tobacco: Never Used  . Alcohol Use: No  . Drug Use: No  . Sexual Activity: Not Asked   Other Topics Concern  . None   Social History Narrative    Outpatient Encounter Prescriptions as of 11/11/2015  Medication Sig  . albuterol (PROVENTIL HFA;VENTOLIN HFA) 108 (90 Base) MCG/ACT inhaler Inhale 2 puffs into the lungs every 4 (four) hours as needed for wheezing or shortness of breath.  Marland Kitchen aspirin 81 MG tablet Take 81 mg by mouth daily.  . calcium carbonate (TUMS - DOSED IN MG ELEMENTAL CALCIUM) 500 MG chewable tablet Chew 1 tablet by mouth daily as needed.   . carvedilol (COREG) 25 MG tablet TAKE ONE TABLET TWICE DAILY WITH A MEAL  . esomeprazole (NEXIUM) 40 MG capsule TAKE ONE CAPSULE BY MOUTH TWICE A DAY BEFORE A MEAL  . fish oil-omega-3 fatty acids 1000 MG capsule Take 1 g  by mouth daily.  . fluticasone (FLONASE) 50 MCG/ACT nasal spray Place 2 sprays into both nostrils daily.  . fluticasone furoate-vilanterol (BREO ELLIPTA) 100-25 MCG/INH AEPB Inhale 1 puff into the lungs daily.  . furosemide (LASIX) 20 MG tablet Take 1 tablet (20 mg total) by mouth daily as needed.  Marland Kitchen losartan-hydrochlorothiazide (HYZAAR) 100-12.5 MG tablet TAKE ONE (1) TABLET EACH DAY  . meloxicam (MOBIC) 15 MG tablet Take 15 mg by mouth daily as needed.   . Misc Natural Products (OSTEO BI-FLEX JOINT SHIELD PO) Take by mouth 2 (two) times daily.  . Multiple Vitamin (MULTIVITAMIN) tablet Take 1 tablet by mouth daily.  . potassium chloride (K-DUR) 10 MEQ tablet Take 1 tablet (10 mEq total) by mouth daily as needed.  Marland Kitchen VITAMIN D, CHOLECALCIFEROL, PO Take 1,000 Units by mouth 2 (two) times daily.   . [DISCONTINUED] Ferrous Sulfate Dried (FEOSOL) 200 (65 FE) MG TABS Take by mouth.  . [DISCONTINUED] pravastatin (PRAVACHOL) 40 MG tablet Take  1 tablet (40 mg total) by mouth daily.  . rosuvastatin (CRESTOR) 20 MG tablet Take 1 tablet (20 mg total) by mouth daily.   No facility-administered encounter medications on file as of 11/11/2015.    Review of Systems  Constitutional: Negative for appetite change and unexpected weight change.  HENT: Negative for congestion and sinus pressure.   Respiratory: Negative for cough, chest tightness and shortness of breath.   Cardiovascular: Negative for chest pain, palpitations and leg swelling.  Gastrointestinal: Negative for nausea, vomiting, abdominal pain and diarrhea.  Genitourinary: Negative for dysuria and difficulty urinating.  Musculoskeletal: Negative for back pain and joint swelling.  Skin: Negative for color change and rash.  Neurological: Negative for dizziness, light-headedness and headaches.  Psychiatric/Behavioral: Negative for dysphoric mood and agitation.       Objective:     Blood pressure rechecked by me:  124/68  Physical Exam  Constitutional: She appears well-developed and well-nourished. No distress.  HENT:  Nose: Nose normal.  Mouth/Throat: Oropharynx is clear and moist.  Neck: Neck supple. No thyromegaly present.  Cardiovascular: Normal rate and regular rhythm.   Pulmonary/Chest: Breath sounds normal. No respiratory distress. She has no wheezes.  Abdominal: Soft. Bowel sounds are normal. There is no tenderness.  Musculoskeletal: She exhibits no edema or tenderness.  Lymphadenopathy:    She has no cervical adenopathy.  Skin: No rash noted. No erythema.  Psychiatric: She has a normal mood and affect. Her behavior is normal.    BP 120/70 mmHg  Pulse 61  Temp(Src) 97.9 F (36.6 C) (Oral)  Resp 18  Ht 5' 1"  (1.549 m)  Wt 183 lb (83.008 kg)  BMI 34.60 kg/m2  SpO2 98%  LMP 05/01/1972 Wt Readings from Last 3 Encounters:  11/11/15 183 lb (83.008 kg)  09/13/15 180 lb 8 oz (81.874 kg)  08/12/15 181 lb 8 oz (82.328 kg)     Lab Results  Component Value  Date   WBC 5.7 06/17/2015   HGB 13.3 06/17/2015   HCT 40.2 06/17/2015   PLT 216.0 06/17/2015   GLUCOSE 100* 10/01/2015   CHOL 180 10/01/2015   TRIG 77.0 10/01/2015   HDL 63.80 10/01/2015   LDLCALC 101* 10/01/2015   ALT 14 10/01/2015   AST 20 10/01/2015   NA 146* 10/01/2015   K 4.2 10/01/2015   CL 106 10/01/2015   CREATININE 0.78 10/01/2015   BUN 18 10/01/2015   CO2 31 10/01/2015   TSH 1.53 02/05/2015   HGBA1C 6.1 10/01/2015   MICROALBUR  4.2* 06/17/2015    Nm Myocar Multi W/spect W/wall Motion / Ef  08/13/2015   There was no ST segment deviation noted during stress.  Pharmacological myocardial perfusion imaging study with no significant  ischemia Normal wall motion, EF estimated at 66% No EKG changes concerning for ischemia at peak stress or in recovery. Low risk scan Signed, Esmond Plants, MD, Ph.D [facility]       Assessment & Plan:   Problem List Items Addressed This Visit    CAD (coronary artery disease)    Had known disease.  Followed by Dr Rockey Situ.  Recent stress test negative.        Relevant Medications   rosuvastatin (CRESTOR) 20 MG tablet   Diabetes mellitus ([place]) - Primary    Low carb diet and exercise.  Follow met b and a1c.        Relevant Medications   rosuvastatin (CRESTOR) 20 MG tablet   Other Relevant Orders   Hemoglobin A1c   Essential hypertension, benign    Blood pressure under good control.  Continue same medication regimen.  Follow pressures.  Follow metabolic panel.        Relevant Medications   rosuvastatin (CRESTOR) 20 MG tablet   Other Relevant Orders   Basic metabolic panel   GERD (gastroesophageal reflux disease)    On nexium.  Symptoms controlled.        Hypercholesterolemia    Low cholesterol diet and exercise.  On pravastatin.  Follow lipid panel and liver function tests.        Relevant Medications   rosuvastatin (CRESTOR) 20 MG tablet   Other Relevant Orders   Lipid panel   Hepatic function panel   Obstructive sleep  apnea    CPAP.       Right knee pain    Better today.  Follow.  Notify me if needs something more.        Stress    Does not feel needs anything more at this time.  Follow.            Einar Pheasant, MD

## 2015-11-11 NOTE — Progress Notes (Signed)
Pre-visit discussion using our clinic review tool. No additional management support is needed unless otherwise documented below in the visit note.  

## 2015-11-13 ENCOUNTER — Encounter: Payer: Self-pay | Admitting: Internal Medicine

## 2015-11-13 DIAGNOSIS — M25561 Pain in right knee: Secondary | ICD-10-CM | POA: Insufficient documentation

## 2015-11-13 MED ORDER — ROSUVASTATIN CALCIUM 20 MG PO TABS: 20.0000 mg | ORAL_TABLET | Freq: Every day | ORAL | Status: DC

## 2015-11-13 NOTE — Assessment & Plan Note (Signed)
Blood pressure under good control.  Continue same medication regimen.  Follow pressures.  Follow metabolic panel.   

## 2015-11-13 NOTE — Assessment & Plan Note (Signed)
Better today.  Follow.  Notify me if needs something more.

## 2015-11-13 NOTE — Assessment & Plan Note (Signed)
Low cholesterol diet and exercise.  On pravastatin.  Follow lipid panel and liver function tests.   

## 2015-11-13 NOTE — Assessment & Plan Note (Signed)
CPAP.  

## 2015-11-13 NOTE — Assessment & Plan Note (Signed)
Had known disease.  Followed by Dr Rockey Situ.  Recent stress test negative.

## 2015-11-13 NOTE — Assessment & Plan Note (Signed)
On nexium.  Symptoms controlled.   

## 2015-11-13 NOTE — Assessment & Plan Note (Signed)
Low carb diet and exercise.  Follow met b and a1c.   

## 2015-11-13 NOTE — Assessment & Plan Note (Signed)
Does not feel needs anything more at this time.  Follow.   

## 2015-12-23 ENCOUNTER — Other Ambulatory Visit (INDEPENDENT_AMBULATORY_CARE_PROVIDER_SITE_OTHER): Payer: Medicare Other

## 2015-12-23 DIAGNOSIS — E1159 Type 2 diabetes mellitus with other circulatory complications: Secondary | ICD-10-CM | POA: Diagnosis not present

## 2015-12-23 DIAGNOSIS — I10 Essential (primary) hypertension: Secondary | ICD-10-CM

## 2015-12-23 DIAGNOSIS — E7800 Pure hypercholesterolemia, unspecified: Secondary | ICD-10-CM

## 2015-12-23 LAB — HEPATIC FUNCTION PANEL
ALK PHOS: 52 U/L (ref 39–117)
ALT: 16 U/L (ref 0–35)
AST: 22 U/L (ref 0–37)
Albumin: 3.9 g/dL (ref 3.5–5.2)
BILIRUBIN DIRECT: 0.1 mg/dL (ref 0.0–0.3)
TOTAL PROTEIN: 6.6 g/dL (ref 6.0–8.3)
Total Bilirubin: 0.5 mg/dL (ref 0.2–1.2)

## 2015-12-23 LAB — BASIC METABOLIC PANEL
BUN: 15 mg/dL (ref 6–23)
CALCIUM: 9 mg/dL (ref 8.4–10.5)
CO2: 31 mEq/L (ref 19–32)
CREATININE: 0.81 mg/dL (ref 0.40–1.20)
Chloride: 105 mEq/L (ref 96–112)
GFR: 72.84 mL/min (ref >60.00)
GLUCOSE: 105 mg/dL — AB (ref 70–99)
POTASSIUM: 4.2 meq/L (ref 3.5–5.1)
Sodium: 142 mEq/L (ref 135–145)

## 2015-12-23 LAB — HEMOGLOBIN A1C: Hgb A1c MFr Bld: 6.2 % (ref 4.6–6.5)

## 2015-12-23 LAB — LIPID PANEL
CHOLESTEROL: 142 mg/dL (ref 0–200)
HDL: 61 mg/dL (ref >39.00)
LDL Cholesterol: 66 mg/dL (ref 0–99)
NonHDL: 81.17
TRIGLYCERIDES: 78 mg/dL (ref 0.0–149.0)
Total CHOL/HDL Ratio: 2
VLDL: 15.6 mg/dL (ref 0.0–40.0)

## 2015-12-24 ENCOUNTER — Encounter: Payer: Self-pay | Admitting: *Deleted

## 2016-02-14 ENCOUNTER — Encounter (INDEPENDENT_AMBULATORY_CARE_PROVIDER_SITE_OTHER): Payer: Self-pay

## 2016-02-14 ENCOUNTER — Ambulatory Visit (INDEPENDENT_AMBULATORY_CARE_PROVIDER_SITE_OTHER): Payer: Medicare Other | Admitting: Internal Medicine

## 2016-02-14 ENCOUNTER — Encounter: Payer: Self-pay | Admitting: Internal Medicine

## 2016-02-14 DIAGNOSIS — I10 Essential (primary) hypertension: Secondary | ICD-10-CM

## 2016-02-14 DIAGNOSIS — R0602 Shortness of breath: Secondary | ICD-10-CM

## 2016-02-14 DIAGNOSIS — I209 Angina pectoris, unspecified: Secondary | ICD-10-CM | POA: Diagnosis not present

## 2016-02-14 DIAGNOSIS — G4733 Obstructive sleep apnea (adult) (pediatric): Secondary | ICD-10-CM | POA: Diagnosis not present

## 2016-02-14 DIAGNOSIS — E1159 Type 2 diabetes mellitus with other circulatory complications: Secondary | ICD-10-CM

## 2016-02-14 DIAGNOSIS — M25561 Pain in right knee: Secondary | ICD-10-CM

## 2016-02-14 DIAGNOSIS — E7800 Pure hypercholesterolemia, unspecified: Secondary | ICD-10-CM

## 2016-02-14 DIAGNOSIS — F439 Reaction to severe stress, unspecified: Secondary | ICD-10-CM | POA: Diagnosis not present

## 2016-02-14 DIAGNOSIS — I2510 Atherosclerotic heart disease of native coronary artery without angina pectoris: Secondary | ICD-10-CM

## 2016-02-14 DIAGNOSIS — E669 Obesity, unspecified: Secondary | ICD-10-CM

## 2016-02-14 NOTE — Progress Notes (Signed)
Patient ID: April [name], female   DOB: 1940/10/12, 75 y.o.   MRN: [id number]   Subjective:    Patient ID: April [name], female    DOB: 1941-02-10, 75 y.o.   MRN: [id number]  HPI  Patient here for a scheduled follow up.  Reports noticing increased cough starting one week ago.  Some previous tightness in her chest with the cough.  Some nasal congestion.  Using nasal spray and inhaler. Is better.  No fever.  No chest pain.  No increased acid reflux.  No abdominal pain or cramping.  Bowels stable.  Knee is better after receiving a massage.  Plans to get another massage.     Past Medical History:  Diagnosis Date  . Arrhythmia    history of an irregular heart beat  . Diverticulitis   . Endometriosis   . Heart murmur   . Hiatal hernia   . History of colon polyps 08/1994  . Hypercholesterolemia   . Hypertension   . Migraines    history of migraines  . Osteoporosis   . Rheumatic fever   . Sleep apnea    CPAP  . Urine incontinence    Past Surgical History:  Procedure Laterality Date  . ABDOMINAL HYSTERECTOMY     ovaries not removed  . APPENDECTOMY     was removed during hysterectomy  . Breast biopsies     x2  . BREAST BIOPSY Bilateral "years ago"  . BREAST SURGERY Bilateral   . COLONOSCOPY  2013  . CORONARY ANGIOPLASTY  2012  . ESOPHAGOGASTRODUODENOSCOPY (EGD) WITH PROPOFOL N/A 03/17/2015   Procedure: ESOPHAGOGASTRODUODENOSCOPY (EGD) WITH PROPOFOL;  Surgeon: Christene Lye, MD;  Location: [facility] ENDOSCOPY;  Service: Gastroenterology;  Laterality: N/A;  . TONSILLECTOMY     Family History  Problem Relation Age of Onset  . Arthritis Mother   . Stroke Mother   . Hypertension Mother   . Osteoporosis Mother   . Heart disease Father     MI  . Heart attack Father   . Stroke Brother   . Breast cancer      first cousin x 2  . Colon cancer Neg Hx    Social History   Social History  . Marital status: Widowed    Spouse name: N/A  . Number of children: 2  . Years of  education: N/A   Social History Main Topics  . Smoking status: Never Smoker  . Smokeless tobacco: Never Used  . Alcohol use No  . Drug use: No  . Sexual activity: Not Asked   Other Topics Concern  . None   Social History Narrative  . None    Outpatient Encounter Prescriptions as of 02/14/2016  Medication Sig  . albuterol (PROVENTIL HFA;VENTOLIN HFA) 108 (90 Base) MCG/ACT inhaler Inhale 2 puffs into the lungs every 4 (four) hours as needed for wheezing or shortness of breath.  Marland Kitchen aspirin 81 MG tablet Take 81 mg by mouth daily.  . calcium carbonate (TUMS - DOSED IN MG ELEMENTAL CALCIUM) 500 MG chewable tablet Chew 1 tablet by mouth daily as needed.   . carvedilol (COREG) 25 MG tablet TAKE ONE TABLET TWICE DAILY WITH A MEAL  . esomeprazole (NEXIUM) 40 MG capsule TAKE ONE CAPSULE BY MOUTH TWICE A DAY BEFORE A MEAL  . fish oil-omega-3 fatty acids 1000 MG capsule Take 1 g by mouth daily.  . fluticasone (FLONASE) 50 MCG/ACT nasal spray Place 2 sprays into both nostrils daily.  . fluticasone furoate-vilanterol (BREO ELLIPTA)  100-25 MCG/INH AEPB Inhale 1 puff into the lungs daily.  . furosemide (LASIX) 20 MG tablet Take 1 tablet (20 mg total) by mouth daily as needed.  Marland Kitchen losartan-hydrochlorothiazide (HYZAAR) 100-12.5 MG tablet TAKE ONE (1) TABLET EACH DAY  . meloxicam (MOBIC) 15 MG tablet Take 15 mg by mouth daily as needed.   . Misc Natural Products (OSTEO BI-FLEX JOINT SHIELD PO) Take by mouth 2 (two) times daily.  . Multiple Vitamin (MULTIVITAMIN) tablet Take 1 tablet by mouth daily.  . potassium chloride (K-DUR) 10 MEQ tablet Take 1 tablet (10 mEq total) by mouth daily as needed.  . rosuvastatin (CRESTOR) 20 MG tablet Take 1 tablet (20 mg total) by mouth daily.  Marland Kitchen VITAMIN D, CHOLECALCIFEROL, PO Take 1,000 Units by mouth 2 (two) times daily.    No facility-administered encounter medications on file as of 02/14/2016.     Review of Systems  Constitutional: Negative for appetite change  and unexpected weight change.  HENT: Positive for congestion. Negative for sinus pressure.   Respiratory: Positive for cough. Negative for chest tightness and shortness of breath.   Cardiovascular: Negative for chest pain and palpitations.       Swelling stable.   Gastrointestinal: Negative for abdominal pain, diarrhea, nausea and vomiting.  Genitourinary: Negative for difficulty urinating and dysuria.  Musculoskeletal: Negative for back pain.       Right knee better.    Skin: Negative for color change and rash.  Neurological: Negative for dizziness, light-headedness and headaches.  Psychiatric/Behavioral: Negative for agitation and dysphoric mood.       Objective:      Blood pressure rechecked by me:  138/80  Physical Exam  Constitutional: She appears well-developed and well-nourished. No distress.  HENT:  Nose: Nose normal.  Mouth/Throat: Oropharynx is clear and moist.  Neck: Neck supple. No thyromegaly present.  Cardiovascular: Normal rate and regular rhythm.   Pulmonary/Chest: Breath sounds normal. No respiratory distress. She has no wheezes.  Abdominal: Soft. Bowel sounds are normal. There is no tenderness.  Musculoskeletal: She exhibits no tenderness.  No increased edema.    Lymphadenopathy:    She has no cervical adenopathy.  Skin: No rash noted. No erythema.  Psychiatric: She has a normal mood and affect. Her behavior is normal.    BP (!) 156/80   Pulse 67   Temp 98.3 F (36.8 C) (Oral)   Ht 5\' 1"  (1.549 m)   Wt 180 lb 6.4 oz (81.8 kg)   LMP 05/01/1972   SpO2 98%   BMI 34.09 kg/m  Wt Readings from Last 3 Encounters:  02/14/16 180 lb 6.4 oz (81.8 kg)  11/11/15 183 lb (83 kg)  09/13/15 180 lb 8 oz (81.9 kg)     Lab Results  Component Value Date   WBC 5.7 06/17/2015   HGB 13.3 06/17/2015   HCT 40.2 06/17/2015   PLT 216.0 06/17/2015   GLUCOSE 105 (H) 12/23/2015   CHOL 142 12/23/2015   TRIG 78.0 12/23/2015   HDL 61.00 12/23/2015   LDLCALC 66  12/23/2015   ALT 16 12/23/2015   AST 22 12/23/2015   NA 142 12/23/2015   K 4.2 12/23/2015   CL 105 12/23/2015   CREATININE 0.81 12/23/2015   BUN 15 12/23/2015   CO2 31 12/23/2015   TSH 1.53 02/05/2015   HGBA1C 6.2 12/23/2015   MICROALBUR 4.2 (H) 06/17/2015    Nm Myocar Multi W/spect W/wall Motion / Ef  Result Date: 08/13/2015  There was no ST segment  deviation noted during stress.  Pharmacological myocardial perfusion imaging study with no significant  ischemia Normal wall motion, EF estimated at 66% No EKG changes concerning for ischemia at peak stress or in recovery. Low risk scan Signed, Esmond Plants, MD, Ph.D [facility]       Assessment & Plan:   Problem List Items Addressed This Visit    CAD (coronary artery disease)    Has known disease.  Followed by cardiology.  Continue risk factor modification.        Diabetes mellitus ([place])    Low carb diet and exercise.  Follow.        Relevant Orders   Hemoglobin H9Q   Basic metabolic panel   Essential hypertension, benign    Blood pressure on recheck improved.  Follow.  Have her spot check her pressure.        Relevant Orders   TSH   Hypercholesterolemia    On crestor.  Low cholesterol diet and exercise.  Follow lipid panel and liver function tests.        Relevant Orders   Hepatic function panel   Lipid panel   Obesity (BMI 30-39.9)    Diet and exercise.  Follow.       Obstructive sleep apnea    CPAP.       Right knee pain    Better after massage.  Follow.       SOB (shortness of breath)    Followed by pulmonary and cardiology.  Some cough recently.  On breo.  Continue rescue inhaler if needed.  Follow. Notify me if symptoms worsen.  Breathing overall stable.       Stress    Overall doing ok.  Does not feel needs anything more at this time.  Follow.        Other Visit Diagnoses   None.      Einar Pheasant, MD

## 2016-02-14 NOTE — Progress Notes (Signed)
Pre visit review using our clinic review tool, if applicable. No additional management support is needed unless otherwise documented below in the visit note. 

## 2016-02-20 ENCOUNTER — Encounter: Payer: Self-pay | Admitting: Internal Medicine

## 2016-02-20 NOTE — Assessment & Plan Note (Signed)
Low carb diet and exercise.  Follow.  

## 2016-02-20 NOTE — Assessment & Plan Note (Signed)
Has known disease.  Followed by cardiology.  Continue risk factor modification.

## 2016-02-20 NOTE — Assessment & Plan Note (Signed)
Blood pressure on recheck improved.  Follow.  Have her spot check her pressure.

## 2016-02-20 NOTE — Assessment & Plan Note (Signed)
Diet and exercise.  Follow.  

## 2016-02-20 NOTE — Assessment & Plan Note (Signed)
CPAP.  

## 2016-02-20 NOTE — Assessment & Plan Note (Signed)
Better after massage.  Follow.

## 2016-02-20 NOTE — Assessment & Plan Note (Signed)
Overall doing ok.  Does not feel needs anything more at this time.  Follow.

## 2016-02-20 NOTE — Assessment & Plan Note (Signed)
On crestor.  Low cholesterol diet and exercise.  Follow lipid panel and liver function tests.   

## 2016-02-20 NOTE — Assessment & Plan Note (Signed)
Followed by pulmonary and cardiology.  Some cough recently.  On breo.  Continue rescue inhaler if needed.  Follow. Notify me if symptoms worsen.  Breathing overall stable.

## 2016-03-03 ENCOUNTER — Other Ambulatory Visit: Payer: Self-pay | Admitting: Internal Medicine

## 2016-03-16 ENCOUNTER — Ambulatory Visit (INDEPENDENT_AMBULATORY_CARE_PROVIDER_SITE_OTHER): Payer: Medicare Other

## 2016-03-16 DIAGNOSIS — Z23 Encounter for immunization: Secondary | ICD-10-CM | POA: Diagnosis not present

## 2016-03-25 ENCOUNTER — Emergency Department
Admission: EM | Admit: 2016-03-25 | Discharge: 2016-03-25 | Payer: Medicare Other | Attending: Emergency Medicine | Admitting: Emergency Medicine

## 2016-03-25 ENCOUNTER — Encounter (HOSPITAL_COMMUNITY)
Admission: RE | Disposition: A | Discharge status: Skilled Nursing Facility | Payer: Self-pay | Source: Other Acute Inpatient Hospital | Attending: Cardiology

## 2016-03-25 ENCOUNTER — Encounter (HOSPITAL_COMMUNITY): Payer: Self-pay | Admitting: Cardiology

## 2016-03-25 ENCOUNTER — Encounter: Payer: Self-pay | Admitting: Emergency Medicine

## 2016-03-25 ENCOUNTER — Emergency Department: Payer: Medicare Other

## 2016-03-25 ENCOUNTER — Inpatient Hospital Stay (HOSPITAL_COMMUNITY)
Admission: RE | Admit: 2016-03-25 | Discharge: 2016-03-29 | DRG: 247 | Disposition: A | Discharge status: Skilled Nursing Facility | Payer: Medicare Other | Source: Other Acute Inpatient Hospital | Attending: Cardiology | Admitting: Cardiology

## 2016-03-25 DIAGNOSIS — I2510 Atherosclerotic heart disease of native coronary artery without angina pectoris: Secondary | ICD-10-CM | POA: Diagnosis not present

## 2016-03-25 DIAGNOSIS — E119 Type 2 diabetes mellitus without complications: Secondary | ICD-10-CM | POA: Diagnosis not present

## 2016-03-25 DIAGNOSIS — I4891 Unspecified atrial fibrillation: Secondary | ICD-10-CM | POA: Diagnosis present

## 2016-03-25 DIAGNOSIS — Z9861 Coronary angioplasty status: Secondary | ICD-10-CM

## 2016-03-25 DIAGNOSIS — I481 Persistent atrial fibrillation: Secondary | ICD-10-CM | POA: Diagnosis not present

## 2016-03-25 DIAGNOSIS — Z955 Presence of coronary angioplasty implant and graft: Secondary | ICD-10-CM | POA: Insufficient documentation

## 2016-03-25 DIAGNOSIS — I213 ST elevation (STEMI) myocardial infarction of unspecified site: Secondary | ICD-10-CM

## 2016-03-25 DIAGNOSIS — G4733 Obstructive sleep apnea (adult) (pediatric): Secondary | ICD-10-CM | POA: Diagnosis not present

## 2016-03-25 DIAGNOSIS — R040 Epistaxis: Secondary | ICD-10-CM | POA: Diagnosis not present

## 2016-03-25 DIAGNOSIS — I10 Essential (primary) hypertension: Secondary | ICD-10-CM | POA: Diagnosis present

## 2016-03-25 DIAGNOSIS — Z6838 Body mass index (BMI) 38.0-38.9, adult: Secondary | ICD-10-CM | POA: Diagnosis present

## 2016-03-25 DIAGNOSIS — E785 Hyperlipidemia, unspecified: Secondary | ICD-10-CM | POA: Diagnosis present

## 2016-03-25 DIAGNOSIS — M810 Age-related osteoporosis without current pathological fracture: Secondary | ICD-10-CM | POA: Diagnosis present

## 2016-03-25 DIAGNOSIS — Z79899 Other long term (current) drug therapy: Secondary | ICD-10-CM | POA: Diagnosis not present

## 2016-03-25 DIAGNOSIS — I2119 ST elevation (STEMI) myocardial infarction involving other coronary artery of inferior wall: Principal | ICD-10-CM | POA: Diagnosis present

## 2016-03-25 DIAGNOSIS — E876 Hypokalemia: Secondary | ICD-10-CM | POA: Diagnosis not present

## 2016-03-25 DIAGNOSIS — E7800 Pure hypercholesterolemia, unspecified: Secondary | ICD-10-CM | POA: Diagnosis present

## 2016-03-25 DIAGNOSIS — E669 Obesity, unspecified: Secondary | ICD-10-CM | POA: Diagnosis present

## 2016-03-25 DIAGNOSIS — Z6833 Body mass index (BMI) 33.0-33.9, adult: Secondary | ICD-10-CM

## 2016-03-25 DIAGNOSIS — I480 Paroxysmal atrial fibrillation: Secondary | ICD-10-CM | POA: Clinically undetermined

## 2016-03-25 DIAGNOSIS — I2111 ST elevation (STEMI) myocardial infarction involving right coronary artery: Secondary | ICD-10-CM

## 2016-03-25 DIAGNOSIS — R079 Chest pain, unspecified: Secondary | ICD-10-CM | POA: Diagnosis not present

## 2016-03-25 HISTORY — DX: ST elevation (STEMI) myocardial infarction involving other coronary artery of inferior wall: I21.19

## 2016-03-25 HISTORY — DX: Atherosclerotic heart disease of native coronary artery without angina pectoris: I25.10

## 2016-03-25 HISTORY — DX: Coronary angioplasty status: Z98.61

## 2016-03-25 HISTORY — PX: CARDIAC CATHETERIZATION: SHX172

## 2016-03-25 LAB — COMPREHENSIVE METABOLIC PANEL
ALT: 26 U/L (ref 14–54)
ANION GAP: 8 (ref 5–15)
AST: 38 U/L (ref 15–41)
Albumin: 4 g/dL (ref 3.5–5.0)
Alkaline Phosphatase: 66 U/L (ref 38–126)
BILIRUBIN TOTAL: 0.6 mg/dL (ref 0.3–1.2)
BUN: 17 mg/dL (ref 6–20)
CHLORIDE: 101 mmol/L (ref 101–111)
CO2: 27 mmol/L (ref 22–32)
Calcium: 9.1 mg/dL (ref 8.9–10.3)
Creatinine, Ser: 0.86 mg/dL (ref 0.44–1.00)
Glucose, Bld: 161 mg/dL — ABNORMAL HIGH (ref 65–99)
POTASSIUM: 3.5 mmol/L (ref 3.5–5.1)
Sodium: 136 mmol/L (ref 135–145)
TOTAL PROTEIN: 7.5 g/dL (ref 6.5–8.1)

## 2016-03-25 LAB — DIFFERENTIAL
BASOS ABS: 0 10*3/uL (ref 0–0.1)
Basophils Relative: 0 %
EOS ABS: 0.2 10*3/uL (ref 0–0.7)
EOS PCT: 2 %
LYMPHS ABS: 2.8 10*3/uL (ref 1.0–3.6)
Lymphocytes Relative: 25 %
Monocytes Absolute: 0.9 10*3/uL (ref 0.2–0.9)
Monocytes Relative: 8 %
NEUTROS PCT: 65 %
Neutro Abs: 7.3 10*3/uL — ABNORMAL HIGH (ref 1.4–6.5)

## 2016-03-25 LAB — APTT: APTT: 26 s (ref 24–36)

## 2016-03-25 LAB — LIPID PANEL
CHOL/HDL RATIO: 2.3 ratio
CHOLESTEROL: 166 mg/dL (ref 0–200)
HDL: 72 mg/dL (ref >40)
LDL Cholesterol: 75 mg/dL (ref 0–99)
TRIGLYCERIDES: 96 mg/dL (ref <150)
VLDL: 19 mg/dL (ref 0–40)

## 2016-03-25 LAB — CBC
HCT: 39.1 % (ref 35.0–47.0)
HEMOGLOBIN: 13.1 g/dL (ref 12.0–16.0)
MCH: 30.8 pg (ref 26.0–34.0)
MCHC: 33.6 g/dL (ref 32.0–36.0)
MCV: 91.6 fL (ref 80.0–100.0)
PLATELETS: 239 10*3/uL (ref 150–440)
RBC: 4.27 MIL/uL (ref 3.80–5.20)
RDW: 12.9 % (ref 11.5–14.5)
WBC: 11.3 10*3/uL — ABNORMAL HIGH (ref 3.6–11.0)

## 2016-03-25 LAB — PROTIME-INR
INR: 0.99
PROTHROMBIN TIME: 13.1 s (ref 11.4–15.2)

## 2016-03-25 LAB — TROPONIN I
TROPONIN I: 0.05 ng/mL — AB (ref <0.03)
Troponin I: 4.96 ng/mL (ref <0.03)

## 2016-03-25 SURGERY — LEFT HEART CATH AND CORONARY ANGIOGRAPHY
Anesthesia: Moderate Sedation

## 2016-03-25 MED ORDER — BIVALIRUDIN 250 MG IV SOLR
INTRAVENOUS | Status: AC
  Filled 2016-03-25: qty 250

## 2016-03-25 MED ORDER — TICAGRELOR 90 MG PO TABS
ORAL_TABLET | ORAL | Status: AC
  Filled 2016-03-25: qty 2

## 2016-03-25 MED ORDER — TICAGRELOR 90 MG PO TABS
90.0000 mg | ORAL_TABLET | Freq: Two times a day (BID) | ORAL | Status: DC
  Administered 2016-03-26 – 2016-03-27 (×3): 90 mg via ORAL
  Filled 2016-03-25 (×3): qty 1

## 2016-03-25 MED ORDER — TICAGRELOR 90 MG PO TABS
ORAL_TABLET | ORAL | Status: DC | PRN
  Administered 2016-03-25: 180 mg via ORAL

## 2016-03-25 MED ORDER — FLUTICASONE PROPIONATE 50 MCG/ACT NA SUSP
2.0000 | Freq: Every day | NASAL | Status: DC
  Filled 2016-03-25: qty 16

## 2016-03-25 MED ORDER — CARVEDILOL 25 MG PO TABS
25.0000 mg | ORAL_TABLET | Freq: Two times a day (BID) | ORAL | Status: DC
  Administered 2016-03-25 – 2016-03-26 (×3): 25 mg via ORAL
  Filled 2016-03-25 (×3): qty 1

## 2016-03-25 MED ORDER — HEPARIN SODIUM (PORCINE) 5000 UNIT/ML IJ SOLN: 60.0000 [IU]/kg | INTRAMUSCULAR | Status: DC

## 2016-03-25 MED ORDER — HEPARIN (PORCINE) IN NACL 2-0.9 UNIT/ML-% IJ SOLN
INTRAMUSCULAR | Status: AC
  Filled 2016-03-25: qty 1000

## 2016-03-25 MED ORDER — FENTANYL CITRATE (PF) 100 MCG/2ML IJ SOLN
INTRAMUSCULAR | Status: AC
  Filled 2016-03-25: qty 2

## 2016-03-25 MED ORDER — ACETAMINOPHEN 325 MG PO TABS: 650.0000 mg | ORAL_TABLET | ORAL | Status: DC | PRN

## 2016-03-25 MED ORDER — BIVALIRUDIN BOLUS VIA INFUSION - CUPID
INTRAVENOUS | Status: DC | PRN
  Administered 2016-03-25: 60.6 mg via INTRAVENOUS

## 2016-03-25 MED ORDER — FENTANYL CITRATE (PF) 100 MCG/2ML IJ SOLN
INTRAMUSCULAR | Status: DC | PRN
  Administered 2016-03-25: 25 ug via INTRAVENOUS

## 2016-03-25 MED ORDER — IOPAMIDOL (ISOVUE-370) INJECTION 76%
INTRAVENOUS | Status: AC
  Filled 2016-03-25: qty 100

## 2016-03-25 MED ORDER — ADULT MULTIVITAMIN W/MINERALS CH
1.0000 | ORAL_TABLET | Freq: Every day | ORAL | Status: DC
  Administered 2016-03-26 – 2016-03-29 (×4): 1 via ORAL
  Filled 2016-03-25 (×5): qty 1

## 2016-03-25 MED ORDER — METOPROLOL TARTRATE 5 MG/5ML IV SOLN
5.0000 mg | Freq: Once | INTRAVENOUS | Status: AC
  Administered 2016-03-25: 5 mg via INTRAVENOUS

## 2016-03-25 MED ORDER — NITROGLYCERIN 1 MG/10 ML FOR IR/CATH LAB
INTRA_ARTERIAL | Status: AC
  Filled 2016-03-25: qty 10

## 2016-03-25 MED ORDER — HYDRALAZINE HCL 20 MG/ML IJ SOLN: 5.0000 mg | INTRAMUSCULAR | Status: AC | PRN

## 2016-03-25 MED ORDER — IOPAMIDOL (ISOVUE-370) INJECTION 76%
INTRAVENOUS | Status: DC | PRN
  Administered 2016-03-25: 155 mL via INTRA_ARTERIAL

## 2016-03-25 MED ORDER — HEPARIN SODIUM (PORCINE) 1000 UNIT/ML IJ SOLN
INTRAMUSCULAR | Status: AC
  Filled 2016-03-25: qty 1

## 2016-03-25 MED ORDER — LOSARTAN POTASSIUM 50 MG PO TABS
100.0000 mg | ORAL_TABLET | Freq: Every day | ORAL | Status: DC
  Administered 2016-03-25 – 2016-03-29 (×5): 100 mg via ORAL
  Filled 2016-03-25 (×5): qty 2

## 2016-03-25 MED ORDER — FUROSEMIDE 20 MG PO TABS
20.0000 mg | ORAL_TABLET | Freq: Every day | ORAL | Status: DC
  Administered 2016-03-26 – 2016-03-29 (×4): 20 mg via ORAL
  Filled 2016-03-25 (×4): qty 1

## 2016-03-25 MED ORDER — SODIUM CHLORIDE 0.9% FLUSH
3.0000 mL | Freq: Two times a day (BID) | INTRAVENOUS | Status: DC
  Administered 2016-03-25 – 2016-03-29 (×7): 3 mL via INTRAVENOUS

## 2016-03-25 MED ORDER — IOPAMIDOL (ISOVUE-370) INJECTION 76%
INTRAVENOUS | Status: AC
  Filled 2016-03-25: qty 250

## 2016-03-25 MED ORDER — ASPIRIN 81 MG PO CHEW
81.0000 mg | CHEWABLE_TABLET | Freq: Every day | ORAL | Status: DC
  Administered 2016-03-26 – 2016-03-27 (×2): 81 mg via ORAL
  Filled 2016-03-25 (×2): qty 1

## 2016-03-25 MED ORDER — SODIUM CHLORIDE 0.9 % IV SOLN: 250.0000 mL | INTRAVENOUS | Status: DC | PRN

## 2016-03-25 MED ORDER — SODIUM CHLORIDE 0.9 % IV SOLN
INTRAVENOUS | Status: AC
  Administered 2016-03-25: via INTRAVENOUS

## 2016-03-25 MED ORDER — LABETALOL HCL 5 MG/ML IV SOLN
INTRAVENOUS | Status: DC | PRN
  Administered 2016-03-25: 10 mg via INTRAVENOUS

## 2016-03-25 MED ORDER — FLUTICASONE FUROATE-VILANTEROL 100-25 MCG/INH IN AEPB
1.0000 | INHALATION_SPRAY | Freq: Every day | RESPIRATORY_TRACT | Status: DC
  Administered 2016-03-26: 1 via RESPIRATORY_TRACT
  Filled 2016-03-25: qty 28

## 2016-03-25 MED ORDER — LABETALOL HCL 5 MG/ML IV SOLN
10.0000 mg | INTRAVENOUS | Status: AC | PRN
  Administered 2016-03-25: 10 mg via INTRAVENOUS
  Filled 2016-03-25: qty 4

## 2016-03-25 MED ORDER — OMEGA-3-ACID ETHYL ESTERS 1 G PO CAPS
1.0000 g | ORAL_CAPSULE | Freq: Every day | ORAL | Status: DC
  Administered 2016-03-26 – 2016-03-29 (×4): 1 g via ORAL
  Filled 2016-03-25 (×4): qty 1

## 2016-03-25 MED ORDER — HEPARIN (PORCINE) IN NACL 2-0.9 UNIT/ML-% IJ SOLN
INTRAMUSCULAR | Status: DC | PRN
Start: 2016-03-25 — End: 2016-03-25
  Administered 2016-03-25: 1000 mL

## 2016-03-25 MED ORDER — HEPARIN SODIUM (PORCINE) 5000 UNIT/ML IJ SOLN
4000.0000 [IU] | Freq: Once | INTRAMUSCULAR | Status: AC
  Administered 2016-03-25: 4000 [IU] via INTRAVENOUS

## 2016-03-25 MED ORDER — NITROGLYCERIN 2 % TD OINT
1.0000 [in_us] | TOPICAL_OINTMENT | Freq: Once | TRANSDERMAL | Status: AC
  Administered 2016-03-25: 1 [in_us] via TOPICAL

## 2016-03-25 MED ORDER — SODIUM CHLORIDE 0.9 % IV SOLN: 10.0000 mL/h | INTRAVENOUS | Status: DC

## 2016-03-25 MED ORDER — ROSUVASTATIN CALCIUM 20 MG PO TABS
40.0000 mg | ORAL_TABLET | Freq: Every day | ORAL | Status: DC
  Administered 2016-03-26 – 2016-03-29 (×4): 40 mg via ORAL
  Filled 2016-03-25 (×4): qty 2

## 2016-03-25 MED ORDER — HEPARIN (PORCINE) IN NACL 2-0.9 UNIT/ML-% IJ SOLN
INTRAMUSCULAR | Status: DC | PRN
  Administered 2016-03-25: 10 mL via INTRA_ARTERIAL

## 2016-03-25 MED ORDER — SODIUM CHLORIDE 0.9 % IV SOLN
INTRAVENOUS | Status: DC | PRN
  Administered 2016-03-25: 1.75 mg/kg/h via INTRAVENOUS

## 2016-03-25 MED ORDER — LIDOCAINE HCL (PF) 1 % IJ SOLN
INTRAMUSCULAR | Status: DC | PRN
  Administered 2016-03-25: 2 mL

## 2016-03-25 MED ORDER — LABETALOL HCL 5 MG/ML IV SOLN
INTRAVENOUS | Status: AC
  Filled 2016-03-25: qty 4

## 2016-03-25 MED ORDER — ALBUTEROL SULFATE (2.5 MG/3ML) 0.083% IN NEBU: 2.5000 mg | INHALATION_SOLUTION | RESPIRATORY_TRACT | Status: DC | PRN

## 2016-03-25 MED ORDER — SODIUM CHLORIDE 0.9% FLUSH: 3.0000 mL | INTRAVENOUS | Status: DC | PRN

## 2016-03-25 MED ORDER — PANTOPRAZOLE SODIUM 40 MG PO TBEC
40.0000 mg | DELAYED_RELEASE_TABLET | Freq: Every day | ORAL | Status: DC
  Administered 2016-03-26 – 2016-03-29 (×4): 40 mg via ORAL
  Filled 2016-03-25 (×4): qty 1

## 2016-03-25 MED ORDER — INSULIN ASPART 100 UNIT/ML ~~LOC~~ SOLN: 0.0000 [IU] | Freq: Three times a day (TID) | SUBCUTANEOUS | Status: DC

## 2016-03-25 MED ORDER — LIDOCAINE HCL (PF) 1 % IJ SOLN
INTRAMUSCULAR | Status: AC
  Filled 2016-03-25: qty 30

## 2016-03-25 MED ORDER — ONDANSETRON HCL 4 MG/2ML IJ SOLN: 4.0000 mg | Freq: Four times a day (QID) | INTRAMUSCULAR | Status: DC | PRN

## 2016-03-25 MED ORDER — VERAPAMIL HCL 2.5 MG/ML IV SOLN
INTRAVENOUS | Status: AC
  Filled 2016-03-25: qty 2

## 2016-03-25 MED ORDER — MIDAZOLAM HCL 2 MG/2ML IJ SOLN
INTRAMUSCULAR | Status: AC
  Filled 2016-03-25: qty 2

## 2016-03-25 MED ORDER — HEPARIN (PORCINE) IN NACL 100-0.45 UNIT/ML-% IJ SOLN
900.0000 [IU]/h | INTRAMUSCULAR | Status: DC
  Filled 2016-03-25: qty 250

## 2016-03-25 MED ORDER — MIDAZOLAM HCL 2 MG/2ML IJ SOLN
INTRAMUSCULAR | Status: DC | PRN
  Administered 2016-03-25: 2 mg via INTRAVENOUS

## 2016-03-25 MED ORDER — NITROGLYCERIN 0.4 MG SL SUBL
0.4000 mg | SUBLINGUAL_TABLET | SUBLINGUAL | Status: DC | PRN
  Administered 2016-03-25: 0.4 mg via SUBLINGUAL

## 2016-03-25 SURGICAL SUPPLY — 23 items
BALLN EMERGE MR 2.0X8 (BALLOONS) ×2
BALLN MOZEC 2.0X9 (BALLOONS) ×2
BALLN ~~LOC~~ EMERGE MR 4.0X12 (BALLOONS) ×2
BALLOON EMERGE MR 2.0X8 (BALLOONS) ×1 IMPLANT
BALLOON MOZEC 2.0X9 (BALLOONS) ×1 IMPLANT
BALLOON ~~LOC~~ EMERGE MR 4.0X12 (BALLOONS) ×1 IMPLANT
CATH INFINITI 5FR ANG PIGTAIL (CATHETERS) ×2 IMPLANT
CATH OPTITORQUE TIG 4.0 5F (CATHETERS) ×2 IMPLANT
CATH VISTA GUIDE 6FR JR4 (CATHETERS) ×2 IMPLANT
DEVICE RAD COMP TR BAND LRG (VASCULAR PRODUCTS) ×2 IMPLANT
ELECT DEFIB PAD ADLT CADENCE (PAD) ×2 IMPLANT
GLIDESHEATH SLEND A-KIT 6F 22G (SHEATH) ×2 IMPLANT
GUIDEWIRE INQWIRE 1.5J.035X260 (WIRE) ×1 IMPLANT
INQWIRE 1.5J .035X260CM (WIRE) ×2
KIT ENCORE 26 ADVANTAGE (KITS) ×2 IMPLANT
KIT HEART LEFT (KITS) ×2 IMPLANT
PACK CARDIAC CATHETERIZATION (CUSTOM PROCEDURE TRAY) ×2 IMPLANT
STENT PROMUS PREM MR 2.5X12 (Permanent Stent) ×2 IMPLANT
STENT PROMUS PREM MR 3.5X16 (Permanent Stent) ×4 IMPLANT
SYR MEDRAD MARK V 150ML (SYRINGE) ×2 IMPLANT
TRANSDUCER W/STOPCOCK (MISCELLANEOUS) ×2 IMPLANT
TUBING CIL FLEX 10 FLL-RA (TUBING) ×2 IMPLANT
WIRE COUGAR XT STRL 190CM (WIRE) ×2 IMPLANT

## 2016-03-25 NOTE — ED Notes (Signed)
EMS at bedside at this time for transfer to [facility] Lab

## 2016-03-25 NOTE — H&P (Signed)
Cardiology Consult    Patient ID: April [name] MRN: [id number], DOB/AGE: [birth year]   Admit date: 03/25/2016 Date of Consult: 03/25/2016  Primary Physician: Einar Pheasant, MD Primary Cardiologist: Ida Rogue, M.D. Requesting Provider: [facility] EDP  Patient Profile    Very pleasant 75 year old woman with a history of moderate, nonobstructive CAD by cardiac catheter ablation 2011 and negative Myoview in April of this year who presented to the [facility] with chest pain with EKG suggesting inferolateral STEMI.  Past Medical History   Past Medical History:  Diagnosis Date  . Arrhythmia    history of an irregular heart beat  . CAD S/P PCI pRCA Promus DES 3.5 x 16 (4.1 mm), ostRPDA Promus DES 2.5 x 12 (2.7 mm) 03/25/2016  . Diverticulitis   . Endometriosis   . Heart murmur    no significant valvular lesion noted on Echo  . Hiatal hernia   . History of colon polyps 08/1994  . Hypercholesterolemia   . Hypertension   . Migraines    history of migraines  . Osteoporosis   . Rheumatic fever   . Sleep apnea    CPAP  . ST elevation myocardial infarction (STEMI) of inferolateral wall, initial episode of care ([place]) 03/25/2016  . Urine incontinence     Past Surgical History:  Procedure Laterality Date  . ABDOMINAL HYSTERECTOMY     ovaries not removed  . APPENDECTOMY     was removed during hysterectomy  . Breast biopsies     x2  . BREAST BIOPSY Bilateral "years ago"  . BREAST SURGERY Bilateral   . CARDIAC CATHETERIZATION  2011   moderate 40% RCA disease  . CARDIAC CATHETERIZATION  01/2010   Dr. Gollan@[email or web address] : Only noted 40% RCA  . COLONOSCOPY  2013  . ESOPHAGOGASTRODUODENOSCOPY (EGD) WITH PROPOFOL N/A 03/17/2015   Procedure: ESOPHAGOGASTRODUODENOSCOPY (EGD) WITH PROPOFOL;  Surgeon: 03/30/2015, MD;  Location: [facility] ENDOSCOPY;  Service: Gastroenterology;  Laterality: N/A;  . NM MYOVIEW ([place] [facility])  07/2015   No evidence ischemia or  infarction. EF 66%. Low risk  . TONSILLECTOMY    . TRANSTHORACIC ECHOCARDIOGRAM  10/2013   Normal LV size and function. EF 55-65%. GR 1 DD. Otherwise normal.     Allergies  Allergies  Allergen Reactions  . Penicillins     Okay to take amoxicillin   . Sulfa Antibiotics     History of Present Illness    [name] presented to [facility] at roughly 1800 hrs. with 45 minutes of substernal chest pressure noted to be 8 out of 10 upon arrival. She was noted on EKG to have roughly 3 mm ST elevation in removal 2, III and aVF as well as roughly 1-2 mm ST elevations in roughly the L4 through V6 with reciprocal changes in I and aVL. Prior to this she had noted to have her stable exertional dyspnea but nothing significant. This was the initial episode of this symptom. Code STEMI was called by the EDP and the patient is being transferred emergently to [facility] Lab for cardiac catheterization. She is currently chest pain-free upon arrival and received heparin bolus was administered with drip, IV Lopressor and 6 doses of 81 mg aspirin.   Medications    Prior to Admission medications   Medication Sig Start Date End Date Taking? Authorizing Provider  albuterol (PROVENTIL HFA;VENTOLIN HFA) 108 (90 Base) MCG/ACT inhaler Inhale 2 puffs into the lungs every 4 (four) hours as needed for wheezing or shortness  of breath. 07/20/15   Vishal Mungal, MD  aspirin 81 MG tablet Take 81 mg by mouth daily.    Historical Provider, MD  calcium carbonate (TUMS - DOSED IN MG ELEMENTAL CALCIUM) 500 MG chewable tablet Chew 1 tablet by mouth daily as needed.     Historical Provider, MD  carvedilol (COREG) 25 MG tablet TAKE ONE TABLET TWICE DAILY WITH A MEAL 09/29/15   Einar Pheasant, MD  esomeprazole (NEXIUM) 40 MG capsule TAKE ONE CAPSULE BY MOUTH TWICE A DAY BEFORE A MEAL 11/01/15   Einar Pheasant, MD  fish oil-omega-3 fatty acids 1000 MG capsule Take 1 g by mouth daily.    Historical Provider, MD    fluticasone (FLONASE) 50 MCG/ACT nasal spray Place 2 sprays into both nostrils daily.    Historical Provider, MD  fluticasone furoate-vilanterol (BREO ELLIPTA) 100-25 MCG/INH AEPB Inhale 1 puff into the lungs daily. 07/20/15   Vishal Mungal, MD  furosemide (LASIX) 20 MG tablet Take 1 tablet (20 mg total) by mouth daily as needed. 06/01/15   Minna Merritts, MD  losartan-hydrochlorothiazide (HYZAAR) 100-12.5 MG tablet TAKE ONE (1) TABLET EACH DAY 03/03/16   Einar Pheasant, MD  meloxicam (MOBIC) 15 MG tablet Take 15 mg by mouth daily as needed.     Historical Provider, MD  Misc Natural Products (OSTEO BI-FLEX JOINT SHIELD PO) Take by mouth 2 (two) times daily.    Historical Provider, MD  Multiple Vitamin (MULTIVITAMIN) tablet Take 1 tablet by mouth daily.    Historical Provider, MD  potassium chloride (K-DUR) 10 MEQ tablet Take 1 tablet (10 mEq total) by mouth daily as needed. 06/01/15   Minna Merritts, MD  rosuvastatin (CRESTOR) 20 MG tablet TAKE ONE (1) TABLET EACH DAY 03/03/16   Einar Pheasant, MD  VITAMIN D, CHOLECALCIFEROL, PO Take 1,000 Units by mouth 2 (two) times daily.     Historical Provider, MD     Family History    Family History  Problem Relation Age of Onset  . Arthritis Mother   . Stroke Mother   . Hypertension Mother   . Osteoporosis Mother   . Heart disease Father     MI  . Heart attack Father   . Stroke Brother   . Breast cancer      first cousin x 2  . Colon cancer Neg Hx     Social History    Social History   Social History  . Marital status: Widowed    Spouse name: N/A  . Number of children: 2  . Years of education: N/A   Occupational History  . Not on file.   Social History Main Topics  . Smoking status: Never Smoker  . Smokeless tobacco: Never Used  . Alcohol use No  . Drug use: No  . Sexual activity: Not on file   Other Topics Concern  . Not on file   Social History Narrative  . No narrative on file     Review of Systems    General:  No  chills, fever, night sweats or weight changes.  Cardiovascular:  Chest pain today Baseline dyspnea on exertion, otherwise no edema, orthopnea, palpitations, paroxysmal nocturnal dyspnea. Dermatological: No rash, lesions/masses Respiratory: No cough, with baseline dyspnea Urologic: No hematuria, dysuria Abdominal:   No nausea, vomiting, diarrhea, bright red blood per rectum, melena, or hematemesis Neurologic:  No visual changes, wkns, changes in mental status. All other systems reviewed and are otherwise negative except as noted above.  Physical Exam  Blood pressure (!) 161/84, pulse 87, resp. rate (!) 0, last menstrual period 05/01/1972, SpO2 (!) 0 %.  General: Pleasant, NAD - currently in no acute distress - pain resolved in the ambulance. Psych: Normal affect. Neuro: Alert and oriented X 3. Moves all extremities spontaneously. CN II-XII grossly intact HEENT: HEENT: Waves/AT, EOMI, MMM, anicteric sclera  Neck: Supple without bruits or JVD. Lungs:  Resp regular and unlabored, CTA. Heart: RRR, Normal S1 and S2; no s3, s4, or murmurs. Abdomen: Soft, non-tender, non-distended, BS + x 4.  Extremities: No clubbing, cyanosis or edema. DP/PT/Radials 2+ and equal bilaterally.  Labs    Troponin (Point of Care Test) No results for input(s): TROPIPOC in the last 72 hours.  Recent Labs  03/25/16 1808  TROPONINI 0.05*   Lab Results  Component Value Date   WBC 11.3 (H) 03/25/2016   HGB 13.1 03/25/2016   HCT 39.1 03/25/2016   MCV 91.6 03/25/2016   PLT 239 03/25/2016     Recent Labs Lab 03/25/16 1808  NA 136  K 3.5  CL 101  CO2 27  BUN 17  CREATININE 0.86  CALCIUM 9.1  PROT 7.5  BILITOT 0.6  ALKPHOS 66  ALT 26  AST 38  GLUCOSE 161*   Lab Results  Component Value Date   CHOL 166 03/25/2016   HDL 72 03/25/2016   LDLCALC 75 03/25/2016   TRIG 96 03/25/2016   No results found for: [facility]   Radiology Studies    Dg Chest Port 1 View  Result Date: 03/25/2016 CLINICAL  DATA:  Centralized chest pain, weakness in right arm. EXAM: PORTABLE CHEST 1 VIEW COMPARISON:  Chest x-ray dated 10/11/2012. FINDINGS: Heart size is upper normal, stable. Overall cardiomediastinal silhouette appears stable. There is mild central pulmonary vascular congestion without overt alveolar pulmonary edema. Lungs otherwise clear. No pleural effusion or pneumothorax seen. Again noted is a hiatal hernia. Osseous structures about the chest are unremarkable. Atherosclerotic changes again noted at the aortic arch. IMPRESSION: 1. Mild central pulmonary vascular congestion, possibly related to resuscitation efforts. Lungs otherwise clear. No overt alveolar pulmonary edema. 2. Hiatal hernia. 3. Aortic atherosclerosis. Electronically Signed   By: Franki Cabot M.D.   On: 03/25/2016 18:28    ECG & Cardiac Imaging    Sinus rhythm, rate 65 bpm. Borderline left axis deviation of -26. 3 mm elevations trichomonal 3 and aVF with 2 mm durations in V4 through V6. Reciprocal changes in I and aVL. -- Inferolateral STEMI. Also changes consistent with possible and to her infarct age undetermined. Noted in previous EKG.  Assessment & Plan    Principal Problem:   ST elevation myocardial infarction (STEMI) of inferolateral wall, initial episode of care [facility]) / CAD-PCI pRCA & ostRPDA: Chest pain free on arrival. RCA was only 80% stenosed with thrombotic lesion in the proximal RCA as well as 90% lesion and ostial PDA.  Admitted to TCU/4NP  Successful 2 site PCI with residual mid RCA disease that seems stable. Plan is for aspirin plus Brilinta for minimum 3 months, could then stop aspirin.  On home dose of beta blocker and ARB (ARB without HCTZ)  Increased Crestor to 40 mg daily.  Normal EF by LV gram. Consider echo as inpatient versus outpatient  Would probably be able to fast track discharge.  Cardiac Rehabilitation-to follow-up at [facility]  Active Problems:   Essential hypertension, benign:  Required IV  labetalol in the Cath Lab. Would go ahead and dose her p.m. medications tonight - she  is on losartan and carvedilol 25 mg twice a day.   Hypercholesterolemia: Increase Crestor to 40 mg   Obstructive sleep apnea: We'll order CPAP per respiratory therapy   Obesity (BMI 30-39.9): Was planning on starting an excise program after Christmas. I think she would probably benefit from cardiac rehabilitation first.   She will follow up with Dr. Rockey Situ.   Signed, Glenetta Hew, MD, MS 03/25/2016, 9:06 PM

## 2016-03-25 NOTE — ED Notes (Signed)
Attempted to call report to [facility] Cath lab. Instructed by Rhona, from [place] that no one is there and that they have a 30 minute time to respond. Instructed to wait 15 minutes and then try to call to give report.

## 2016-03-25 NOTE — ED Triage Notes (Addendum)
Pt reports central chest pain that began approximately 45 minutes ago. Pt states both arms feel funny. Pt reports SOB and back pain. Pt reports she chewed six 81 mg aspirin tablets prior to arrival.

## 2016-03-25 NOTE — ED Notes (Signed)
Nurse calls [facility] in attempt to give report to [facility] cone cath lab. Pt now in route with EMS

## 2016-03-25 NOTE — ED Notes (Signed)
Pt reports chewing six 81mg  Asprin prior to arrival.

## 2016-03-25 NOTE — Progress Notes (Signed)
ANTICOAGULATION CONSULT NOTE - Initial Consult  Pharmacy Consult for heparin drip  Indication: chest pain/ACS  Allergies  Allergen Reactions  . Penicillins     Okay to take amoxicillin   . Sulfa Antibiotics     Patient Measurements:   Heparin Dosing Weight: 70kg   Vital Signs: Temp: 98.6 F (37 C) (11/25 1814) Temp Source: Oral (11/25 1814) BP: 182/87 (11/25 1823) Pulse Rate: 74 (11/25 1823)  Labs: No results for input(s): HGB, HCT, PLT, APTT, LABPROT, INR, HEPARINUNFRC, HEPRLOWMOCWT, CREATININE, CKTOTAL, CKMB, TROPONINI in the last 72 hours.  CrCl cannot be calculated (Patient's most recent lab result is older than the maximum 21 days allowed.).   Medical History: Past Medical History:  Diagnosis Date  . Arrhythmia    history of an irregular heart beat  . Diverticulitis   . Endometriosis   . Heart murmur   . Hiatal hernia   . History of colon polyps 08/1994  . Hypercholesterolemia   . Hypertension   . Migraines    history of migraines  . Osteoporosis   . Rheumatic fever   . Sleep apnea    CPAP  . Urine incontinence     Assessment: 75 yo female with STEMI. Pharmacy consulted for heparin drip dosing.  Patient already received bolus 4000units in ED.  Weight not recorded in chart at time of dosing. Nurse reported patient's weight at 80kg.    Goal of Therapy:  Heparin level 0.3-0.7 units/ml Monitor platelets by anticoagulation protocol: Yes   Plan:  Heparin dosing weight: 70 kg  Baseline labs ordered.  Start heparin infusion at 900 units/hr Check anti-Xa level in 8 hours and daily while on heparin Continue to monitor H&H and platelets  Pernell Dupre, PharmD Clinical Pharmacist  03/25/2016,6:32 PM

## 2016-03-25 NOTE — ED Provider Notes (Signed)
[facility] Emergency Department Provider Note        Time seen: ----------------------------------------- 6:09 PM on 03/25/2016 -----------------------------------------    I have reviewed the triage vital signs and the nursing notes.   HISTORY  Chief Complaint Chest Pain    HPI April [name] is a 75 y.o. female who presents to ER for central chest pain that began approximately 45 minutes ago. Patient states both arms feel funny. Patient states she's not had a known heart attack that she was aware of, she does report shortness of breath and back pain currently. Patient had taken aspirin prior to arrival with minimal improvement in her pain. Currently the pain is 8 out of 10.   Past Medical History:  Diagnosis Date  . Arrhythmia    history of an irregular heart beat  . Diverticulitis   . Endometriosis   . Heart murmur   . Hiatal hernia   . History of colon polyps 08/1994  . Hypercholesterolemia   . Hypertension   . Migraines    history of migraines  . Osteoporosis   . Rheumatic fever   . Sleep apnea    CPAP  . Urine incontinence     Patient Active Problem List   Diagnosis Date Noted  . Right knee pain 11/13/2015  . CAD (coronary artery disease) 06/01/2015  . Diabetes mellitus ([place]) 05/16/2015  . Gastritis 04/01/2015  . Chest pain 04/01/2015  . Hiatal hernia 08/25/2014  . DOE (dyspnea on exertion) 07/15/2014  . Congestion of throat 07/06/2014  . Health care maintenance 07/06/2014  . Fibrocystic breast disease 07/06/2014  . Obesity (BMI 30-39.9) 04/19/2014  . Stress 02/16/2013  . Rib pain on right side 02/16/2013  . SOB (shortness of breath) 10/13/2012  . GERD (gastroesophageal reflux disease) 10/13/2012  . Essential hypertension, benign 08/18/2012  . Hypercholesterolemia 08/18/2012  . History of colonic polyps 08/18/2012  . Obstructive sleep apnea 08/18/2012  . Osteoporosis 08/18/2012    Past Surgical History:  Procedure  Laterality Date  . ABDOMINAL HYSTERECTOMY     ovaries not removed  . APPENDECTOMY     was removed during hysterectomy  . Breast biopsies     x2  . BREAST BIOPSY Bilateral "years ago"  . BREAST SURGERY Bilateral   . COLONOSCOPY  2013  . CORONARY ANGIOPLASTY  2012  . ESOPHAGOGASTRODUODENOSCOPY (EGD) WITH PROPOFOL N/A 03/17/2015   Procedure: ESOPHAGOGASTRODUODENOSCOPY (EGD) WITH PROPOFOL;  Surgeon: Christene Lye, MD;  Location: [facility] ENDOSCOPY;  Service: Gastroenterology;  Laterality: N/A;  . TONSILLECTOMY      Allergies Penicillins and Sulfa antibiotics  Social History Social History  Substance Use Topics  . Smoking status: Never Smoker  . Smokeless tobacco: Never Used  . Alcohol use No    Review of Systems Constitutional: Negative for fever. Cardiovascular:Positive for chest pain Respiratory: Positive shortness of breath Gastrointestinal: Negative for abdominal pain, vomiting and diarrhea. Genitourinary: Negative for dysuria. Musculoskeletal: Negative for back pain. Skin: Negative for rash. Neurological: Negative for headaches, focal weakness or numbness.  10-point ROS otherwise negative.  ____________________________________________   PHYSICAL EXAM:  VITAL SIGNS: ED Triage Vitals [03/25/16 1803]  Enc Vitals Group     BP      Pulse      Resp      Temp      Temp src      SpO2      Weight      Height      Head Circumference  Peak Flow      Pain Score 8     Pain Loc      Pain Edu?      Excl. in [place]?     Constitutional: Alert and oriented. Well appearing and in no distress. Eyes: Conjunctivae are normal. PERRL. Normal extraocular movements. ENT   Head: Normocephalic and atraumatic.   Nose: No congestion/rhinnorhea.   Mouth/Throat: Mucous membranes are moist.   Neck: No stridor. Cardiovascular: Normal rate, regular rhythm. No murmurs, rubs, or gallops. Respiratory: Normal respiratory effort without tachypnea nor retractions.  Breath sounds are clear and equal bilaterally. No wheezes/rales/rhonchi. Gastrointestinal: Soft and nontender. Normal bowel sounds Musculoskeletal: Nontender with normal range of motion in all extremities. No lower extremity tenderness nor edema. Neurologic:  Normal speech and language. No gross focal neurologic deficits are appreciated.  Skin:  Skin is warm, dry and intact. No rash noted. Psychiatric: Mood and affect are normal. Speech and behavior are normal.  ____________________________________________  EKG: Interpreted by me.Sinus rhythm with PACs, septal infarct age indeterminate, inferior lateral ST elevation consistent with STEMI, leftward axis  ____________________________________________  ED COURSE:  Pertinent labs & imaging results that were available during my care of the patient were reviewed by me and considered in my medical decision making (see chart for details). Clinical Course   Patient resents to ER with ST elevation MI criteria. I will discuss with his [facility] transfer center and cardiology for acute PCI.  Procedures ____________________________________________   LABS (pertinent positives/negatives)  Labs Reviewed  CBC  DIFFERENTIAL  PROTIME-INR  APTT  COMPREHENSIVE METABOLIC PANEL  TROPONIN I  LIPID PANEL   CRITICAL CARE Performed by: Earleen Newport   Total critical care time: 30 minutes  Critical care time was exclusive of separately billable procedures and treating other patients.  Critical care was necessary to treat or prevent imminent or life-threatening deterioration.  Critical care was time spent personally by me on the following activities: development of treatment plan with patient and/or surrogate as well as nursing, discussions with consultants, evaluation of patient's response to treatment, examination of patient, obtaining history from patient or surrogate, ordering and performing treatments and interventions, ordering and review of  laboratory studies, ordering and review of radiographic studies, pulse oximetry and re-evaluation of patient's condition.  RADIOLOGY  Chest x-ray Is unremarkable ____________________________________________  FINAL ASSESSMENT AND PLAN  ST elevation MI  Plan: Patient with labs and imaging as dictated above. Patient presented to the ER with chest tightness and EKG findings consistent with STEMI. EKG is markedly changed from her prior. We have discussed with the [facility] transfer center, patient has been accepted in transfer by Dr. Selena Batten. She has received aspirin prehospital as well as heparin bolus here. She is being transferred emergently for PCI.   Earleen Newport, MD   Note: This dictation was prepared with Dragon dictation. Any transcriptional errors that result from this process are unintentional    Earleen Newport, MD 03/25/16 [phone]

## 2016-03-26 DIAGNOSIS — I2119 ST elevation (STEMI) myocardial infarction involving other coronary artery of inferior wall: Principal | ICD-10-CM

## 2016-03-26 LAB — BASIC METABOLIC PANEL
ANION GAP: 7 (ref 5–15)
BUN: 11 mg/dL (ref 6–20)
CO2: 27 mmol/L (ref 22–32)
Calcium: 8.5 mg/dL — ABNORMAL LOW (ref 8.9–10.3)
Chloride: 105 mmol/L (ref 101–111)
Creatinine, Ser: 0.82 mg/dL (ref 0.44–1.00)
GFR calc Af Amer: >60 mL/min (ref >60)
GFR calc non Af Amer: >60 mL/min (ref >60)
GLUCOSE: 163 mg/dL — AB (ref 65–99)
POTASSIUM: 3.2 mmol/L — AB (ref 3.5–5.1)
Sodium: 139 mmol/L (ref 135–145)

## 2016-03-26 LAB — HEPATIC FUNCTION PANEL
ALBUMIN: 2.9 g/dL — AB (ref 3.5–5.0)
ALK PHOS: 53 U/L (ref 38–126)
ALT: 26 U/L (ref 14–54)
AST: 76 U/L — ABNORMAL HIGH (ref 15–41)
BILIRUBIN TOTAL: 0.4 mg/dL (ref 0.3–1.2)
Total Protein: 5.1 g/dL — ABNORMAL LOW (ref 6.5–8.1)

## 2016-03-26 LAB — GLUCOSE, CAPILLARY
GLUCOSE-CAPILLARY: 170 mg/dL — AB (ref 65–99)
GLUCOSE-CAPILLARY: 127 mg/dL — AB (ref 65–99)
GLUCOSE-CAPILLARY: 146 mg/dL — AB (ref 65–99)
Glucose-Capillary: 120 mg/dL — ABNORMAL HIGH (ref 65–99)

## 2016-03-26 LAB — LIPID PANEL
CHOLESTEROL: 133 mg/dL (ref 0–200)
HDL: 53 mg/dL (ref >40)
LDL Cholesterol: 63 mg/dL (ref 0–99)
TRIGLYCERIDES: 87 mg/dL (ref <150)
Total CHOL/HDL Ratio: 2.5 RATIO
VLDL: 17 mg/dL (ref 0–40)

## 2016-03-26 LAB — CBC
HEMATOCRIT: 33.7 % — AB (ref 36.0–46.0)
HEMOGLOBIN: 11.2 g/dL — AB (ref 12.0–15.0)
MCH: 30.5 pg (ref 26.0–34.0)
MCHC: 33.2 g/dL (ref 30.0–36.0)
MCV: 91.8 fL (ref 78.0–100.0)
Platelets: 209 10*3/uL (ref 150–400)
RBC: 3.67 MIL/uL — ABNORMAL LOW (ref 3.87–5.11)
RDW: 13 % (ref 11.5–15.5)
WBC: 9.8 10*3/uL (ref 4.0–10.5)

## 2016-03-26 LAB — TROPONIN I
TROPONIN I: 11.4 ng/mL — AB (ref <0.03)
Troponin I: 14.25 ng/mL (ref <0.03)

## 2016-03-26 LAB — MRSA PCR SCREENING: MRSA by PCR: NEGATIVE

## 2016-03-26 MED ORDER — FLUTICASONE PROPIONATE 50 MCG/ACT NA SUSP
2.0000 | Freq: Every day | NASAL | Status: DC
  Administered 2016-03-26 – 2016-03-28 (×3): 2 via NASAL
  Filled 2016-03-26: qty 16

## 2016-03-26 MED ORDER — FLUTICASONE PROPIONATE 50 MCG/ACT NA SUSP
2.0000 | Freq: Every day | NASAL | Status: DC
  Filled 2016-03-26: qty 16

## 2016-03-26 MED ORDER — METOPROLOL TARTRATE 5 MG/5ML IV SOLN
5.0000 mg | Freq: Once | INTRAVENOUS | Status: AC
  Administered 2016-03-26: 5 mg via INTRAVENOUS

## 2016-03-26 MED ORDER — AMLODIPINE BESYLATE 2.5 MG PO TABS
2.5000 mg | ORAL_TABLET | Freq: Every day | ORAL | Status: DC
  Administered 2016-03-26 – 2016-03-29 (×4): 2.5 mg via ORAL
  Filled 2016-03-26 (×4): qty 1

## 2016-03-26 MED ORDER — METOPROLOL TARTRATE 25 MG PO TABS
37.5000 mg | ORAL_TABLET | Freq: Four times a day (QID) | ORAL | Status: DC
  Administered 2016-03-27: 37.5 mg via ORAL
  Filled 2016-03-26: qty 1

## 2016-03-26 MED ORDER — LABETALOL HCL 5 MG/ML IV SOLN: 10.0000 mg | INTRAVENOUS | Status: AC | PRN

## 2016-03-26 MED ORDER — METOPROLOL TARTRATE 5 MG/5ML IV SOLN
INTRAVENOUS | Status: AC
  Administered 2016-03-26: 5 mg
  Filled 2016-03-26: qty 5

## 2016-03-26 MED ORDER — LABETALOL HCL 5 MG/ML IV SOLN
INTRAVENOUS | Status: AC
  Administered 2016-03-26: 10 mg
  Filled 2016-03-26: qty 4

## 2016-03-26 NOTE — Progress Notes (Signed)
Patients Heart rate remained elevated, continues to be asymptomatic blood pressure elevated. Called Dr. Nyoka Cowden and orders given to give Lopressor 5 mg IV. Family at bedside .        Soleia Badolato RN

## 2016-03-26 NOTE — Progress Notes (Signed)
While getting report on April [name], Patients heart rate noted to be in the 150's and blood Pressure 146/115. No complaints of pain or discomfort. Reviewed patients chart and was noted to be in afib around 7PM. 2 doses of Labetalol given per orders.           Stacey Sago RN

## 2016-03-26 NOTE — Progress Notes (Signed)
Patient Name: April [name]      SUBJECTIVE:   Patient Profile     75 year old woman with a history of moderate, nonobstructive CAD by cardiac catheter ablation 2011 and negative Myoview in April of this year who presented to the [facility] with chest pain with EKG suggesting inferolateral STEMI.  She underwent emergent catheterization revealed 80% RCA lesion  a 90% RPDA lesion noted  and Both were DES.  Mild nonobstructive LAD disease Dual antiplatelet therapy initiated. EF 55%.   No chest pain    Past Medical History:  Diagnosis Date  . Arrhythmia    history of an irregular heart beat  . CAD S/P PCI pRCA Promus DES 3.5 x 16 (4.1 mm), ostRPDA Promus DES 2.5 x 12 (2.7 mm) 03/25/2016  . Diverticulitis   . Endometriosis   . Heart murmur    no significant valvular lesion noted on Echo  . Hiatal hernia   . History of colon polyps 08/1994  . Hypercholesterolemia   . Hypertension   . Migraines    history of migraines  . Osteoporosis   . Rheumatic fever   . Sleep apnea    CPAP  . ST elevation myocardial infarction (STEMI) of inferolateral wall, initial episode of care ([place]) 03/25/2016  . Urine incontinence     Scheduled Meds:  Scheduled Meds: . aspirin  81 mg Oral Daily  . carvedilol  25 mg Oral BID WC  . fluticasone  2 spray Each Nare Daily  . fluticasone furoate-vilanterol  1 puff Inhalation Daily  . furosemide  20 mg Oral Daily  . insulin aspart  0-9 Units Subcutaneous TID WC  . losartan  100 mg Oral Daily  . multivitamin with minerals  1 tablet Oral Daily  . omega-3 acid ethyl esters  1 g Oral Daily  . pantoprazole  40 mg Oral Daily  . rosuvastatin  40 mg Oral Daily  . sodium chloride flush  3 mL Intravenous Q12H  . ticagrelor  90 mg Oral BID   Continuous Infusions: sodium chloride, acetaminophen, albuterol, ondansetron (ZOFRAN) IV, sodium chloride flush    PHYSICAL EXAM Vitals:   03/26/16 0500 03/26/16 0600 03/26/16 0700  03/26/16 0825  BP: (!) 145/59 (!) 148/57 (!) 143/56 (!) 171/65  Pulse: 60 (!) 59 (!) 57 85  Resp: 13 18 14 15   Temp:    98 F (36.7 C)  TempSrc:    Oral  SpO2: 98% 99% 98% 97%  Weight:        Well developed and nourished in no acute distress HENT normal Neck supple with JVP-flat Clear Regular rate and rhythm, no murmurs or gallops Abd-soft with active BS No Clubbing cyanosis edema Skin-warm and dry A & Oriented  Grossly normal sensory and motor function  TELEMETRY: Reviewed personnally pt in  Normal sinus rhythm with nonsustained ventricular tachycardia 1:  ECG personally reviewed sinus rhythm with inferior T-wave inversions and poor R-wave progression. The former are new the latter the active 4/17    Intake/Output Summary (Last 24 hours) at 03/26/16 1029 Last data filed at 03/26/16 0400  Gross per 24 hour  Intake            512.9 ml  Output              750 ml  Net           -237.1 ml    LABS: Basic Metabolic Panel:  Recent Labs Lab 03/25/16 1808  03/26/16 0253  NA 136 139  K 3.5 3.2*  CL 101 105  CO2 27 27  GLUCOSE 161* 163*  BUN 17 11  CREATININE 0.86 0.82  CALCIUM 9.1 8.5*   Cardiac Enzymes:  Recent Labs  03/25/16 1808 03/25/16 2230 03/26/16 0253  TROPONINI 0.05* 4.96* 11.40*   CBC:  Recent Labs Lab 03/25/16 1808 03/26/16 0253  WBC 11.3* 9.8  NEUTROABS 7.3*  --   HGB 13.1 11.2*  HCT 39.1 33.7*  MCV 91.6 91.8  PLT 239 209   PROTIME:  Recent Labs  03/25/16 1808  LABPROT 13.1  INR 0.99   Liver Function Tests:  Recent Labs  03/25/16 1808 03/26/16 0253  AST 38 76*  ALT 26 26  ALKPHOS 66 53  BILITOT 0.6 0.4  PROT 7.5 5.1*  ALBUMIN 4.0 2.9*   No results for input(s): LIPASE, AMYLASE in the last 72 hours. BNP: BNP (last 3 results) No results for input(s): BNP in the last 8760 hours.  ProBNP (last 3 results) No results for input(s): PROBNP in the last 8760 hours.  D-Dimer: No results for input(s): DDIMER in the last 72  hours. Hemoglobin A1C: No results for input(s): HGBA1C in the last 72 hours. Fasting Lipid Panel:  Recent Labs  03/26/16 0253  CHOL 133  HDL 53  LDLCALC 63  TRIG 87  CHOLHDL 2.5      ASSESSMENT AND PLAN:  Principal Problem:   ST elevation myocardial infarction (STEMI) of inferolateral wall, initial episode of care [facility]) Active Problems:   Essential hypertension, benign   Hypercholesterolemia   Obstructive sleep apnea   Obesity (BMI 30-39.9)   CAD S/P PCI pRCA Promus DES 3.5 x 16 (4.1 mm), ostRPDA Promus DES 2.5 x 12 (2.7 mm)  The patient is hypokalemic this morning. We will replete potassium.   Carvedilol and losartan resumed last night with blood pressure is better although not adequately controlled.  Will add amlodipine   Last hemoglobin A1c was 8/17 6.2. As, appropriate blood pressure target would be in the range of 130  Begin to ambulate   Signed, Virl Axe MD  03/26/2016

## 2016-03-26 NOTE — Progress Notes (Signed)
Pt presented with 5-beat run of VT

## 2016-03-27 ENCOUNTER — Encounter (HOSPITAL_COMMUNITY): Payer: Self-pay | Admitting: Cardiology

## 2016-03-27 DIAGNOSIS — I481 Persistent atrial fibrillation: Secondary | ICD-10-CM

## 2016-03-27 DIAGNOSIS — I2510 Atherosclerotic heart disease of native coronary artery without angina pectoris: Secondary | ICD-10-CM

## 2016-03-27 DIAGNOSIS — Z9861 Coronary angioplasty status: Secondary | ICD-10-CM

## 2016-03-27 DIAGNOSIS — I480 Paroxysmal atrial fibrillation: Secondary | ICD-10-CM | POA: Clinically undetermined

## 2016-03-27 LAB — GLUCOSE, CAPILLARY
GLUCOSE-CAPILLARY: 136 mg/dL — AB (ref 65–99)
GLUCOSE-CAPILLARY: 108 mg/dL — AB (ref 65–99)
Glucose-Capillary: 142 mg/dL — ABNORMAL HIGH (ref 65–99)
Glucose-Capillary: 130 mg/dL — ABNORMAL HIGH (ref 65–99)

## 2016-03-27 LAB — POCT I-STAT, CHEM 8
BUN: 16 mg/dL (ref 6–20)
Calcium, Ion: 1.17 mmol/L (ref 1.15–1.40)
Chloride: 100 mmol/L — ABNORMAL LOW (ref 101–111)
Creatinine, Ser: 0.8 mg/dL (ref 0.44–1.00)
Glucose, Bld: 156 mg/dL — ABNORMAL HIGH (ref 65–99)
HEMATOCRIT: 37 % (ref 36.0–46.0)
HEMOGLOBIN: 12.6 g/dL (ref 12.0–15.0)
POTASSIUM: 3.5 mmol/L (ref 3.5–5.1)
SODIUM: 138 mmol/L (ref 135–145)
TCO2: 25 mmol/L (ref 0–100)

## 2016-03-27 LAB — HEMOGLOBIN A1C
HEMOGLOBIN A1C: 6.2 % — AB (ref 4.8–5.6)
Mean Plasma Glucose: 131 mg/dL

## 2016-03-27 LAB — POCT ACTIVATED CLOTTING TIME: ACTIVATED CLOTTING TIME: 494 s

## 2016-03-27 MED ORDER — AMIODARONE LOAD VIA INFUSION
150.0000 mg | Freq: Once | INTRAVENOUS | Status: AC
  Administered 2016-03-27: 150 mg via INTRAVENOUS
  Filled 2016-03-27: qty 83.34

## 2016-03-27 MED ORDER — POTASSIUM CHLORIDE CRYS ER 20 MEQ PO TBCR
40.0000 meq | EXTENDED_RELEASE_TABLET | Freq: Two times a day (BID) | ORAL | Status: AC
  Administered 2016-03-27 (×2): 40 meq via ORAL
  Filled 2016-03-27 (×2): qty 2

## 2016-03-27 MED ORDER — AMIODARONE HCL IN DEXTROSE 360-4.14 MG/200ML-% IV SOLN
INTRAVENOUS | Status: AC
Start: 2016-03-27 — End: 2016-03-27
  Administered 2016-03-27: 33.3 mg/h
  Filled 2016-03-27: qty 200

## 2016-03-27 MED ORDER — ENOXAPARIN SODIUM 80 MG/0.8ML ~~LOC~~ SOLN
80.0000 mg | Freq: Two times a day (BID) | SUBCUTANEOUS | Status: DC
  Administered 2016-03-27 (×2): 80 mg via SUBCUTANEOUS
  Filled 2016-03-27 (×2): qty 0.8

## 2016-03-27 MED ORDER — METOPROLOL TARTRATE 50 MG PO TABS
50.0000 mg | ORAL_TABLET | Freq: Four times a day (QID) | ORAL | Status: DC
  Administered 2016-03-27 – 2016-03-28 (×4): 50 mg via ORAL
  Filled 2016-03-27 (×4): qty 1

## 2016-03-27 MED ORDER — APIXABAN 5 MG PO TABS
5.0000 mg | ORAL_TABLET | Freq: Two times a day (BID) | ORAL | Status: DC
  Administered 2016-03-27 – 2016-03-29 (×4): 5 mg via ORAL
  Filled 2016-03-27 (×4): qty 1

## 2016-03-27 MED ORDER — CLOPIDOGREL BISULFATE 300 MG PO TABS
300.0000 mg | ORAL_TABLET | Freq: Once | ORAL | Status: AC
  Administered 2016-03-27: 300 mg via ORAL
  Filled 2016-03-27: qty 1

## 2016-03-27 MED ORDER — AMIODARONE HCL IN DEXTROSE 360-4.14 MG/200ML-% IV SOLN
30.0000 mg/h | INTRAVENOUS | Status: DC
  Administered 2016-03-28: 30 mg/h via INTRAVENOUS
  Filled 2016-03-27 (×2): qty 200

## 2016-03-27 MED ORDER — CLOPIDOGREL BISULFATE 75 MG PO TABS
75.0000 mg | ORAL_TABLET | Freq: Every day | ORAL | Status: DC
  Administered 2016-03-28 – 2016-03-29 (×2): 75 mg via ORAL
  Filled 2016-03-27 (×2): qty 1

## 2016-03-27 MED ORDER — AMIODARONE HCL IN DEXTROSE 360-4.14 MG/200ML-% IV SOLN
60.0000 mg/h | INTRAVENOUS | Status: AC
  Administered 2016-03-27 (×2): 60 mg/h via INTRAVENOUS
  Filled 2016-03-27: qty 200

## 2016-03-27 MED FILL — Nitroglycerin IV Soln 100 MCG/ML in D5W: INTRA_ARTERIAL | Qty: 10 | Status: AC

## 2016-03-27 NOTE — Care Management Note (Signed)
Case Management Note  Patient Details  Name: April [name] MRN: [id number] Date of Birth: 02-17-1941  Subjective/Objective:    Adm w stemi                Action/Plan: to return home on eliquis and plavix   Expected Discharge Date:                  Expected Discharge Plan:  Home/Self Care  In-House Referral:     Discharge planning Services  CM Consult, Medication Assistance  Post Acute Care Choice:    Choice offered to:     DME Arranged:    DME Agency:     HH Arranged:    HH Agency:     Status of Service:  Completed, signed off  If discussed at [employer], dates discussed:    Additional Comments: gave pt 30day free eliquis card. Lives at home, pcp dr Nicki Reaper.  Lacretia Leigh, RN 03/27/2016, 11:03 AM

## 2016-03-27 NOTE — Progress Notes (Signed)
ANTICOAGULATION CONSULT NOTE - Initial Consult  Pharmacy Consult for Lovenox  Indication: atrial fibrillation  Allergies  Allergen Reactions  . Penicillins Other (See Comments)    Okay to take amoxicillin/(pt does not recall what the reaction to penicillin was (7-77 years old)   . Sulfa Antibiotics Rash   Patient Measurements: Weight: 183 lb 6.8 oz (83.2 kg)  Vital Signs: Temp: 98.4 F (36.9 C) (11/26 2315) Temp Source: Oral (11/26 2315) BP: 110/63 (11/26 2300) Pulse Rate: 139 (11/26 2315)  Labs:  Recent Labs  03/25/16 1808 03/25/16 2230 03/26/16 0253 03/26/16 0945  HGB 13.1  --  11.2*  --   HCT 39.1  --  33.7*  --   PLT 239  --  209  --   APTT 26  --   --   --   LABPROT 13.1  --   --   --   INR 0.99  --   --   --   CREATININE 0.86  --  0.82  --   TROPONINI 0.05* 4.96* 11.40* 14.25*    Estimated Creatinine Clearance: 56.2 mL/min (by C-G formula based on SCr of 0.82 mg/dL).  Medical History: Past Medical History:  Diagnosis Date  . Arrhythmia    history of an irregular heart beat  . CAD S/P PCI pRCA Promus DES 3.5 x 16 (4.1 mm), ostRPDA Promus DES 2.5 x 12 (2.7 mm) 03/25/2016  . Diverticulitis   . Endometriosis   . Heart murmur    no significant valvular lesion noted on Echo  . Hiatal hernia   . History of colon polyps 08/1994  . Hypercholesterolemia   . Hypertension   . Migraines    history of migraines  . Osteoporosis   . Rheumatic fever   . Sleep apnea    CPAP  . ST elevation myocardial infarction (STEMI) of inferolateral wall, initial episode of care ([place]) 03/25/2016  . Urine incontinence     Assessment: 75 y/o F with recent MI now in atrial fibrillation, starting Lovenox, CBC ok, renal function ok.   Goal of Therapy:  Monitor platelets by anticoagulation protocol: Yes   Plan:  -Lovenox 1 mg/kg subcutaneous q12h -Minimum q72h CBC while inpatient  -Monitor for bleeding -F/U plans for long term anti-coagulation   Narda Bonds 03/27/2016,2:26 AM

## 2016-03-27 NOTE — Progress Notes (Signed)
    Was on 4N for a separate patient,but was stopped by RN due to the pt having gone into Afib RVR @ ~ 7PM (11/26) --> no significant improvement with IV BB & additional PO BB.    With her recent MI & known onset of Afib - will initiate Amiodarone bolus & infusion.  Will also need anticoagulation.  For now will dose Lovenox. Will need to determine the appropriate long term Rx with recent PCI on AM rounds.  Glenetta Hew, MD

## 2016-03-27 NOTE — Progress Notes (Signed)
CARDIAC REHAB PHASE I   PRE:  Rate/Rhythm: 66 SR    BP: sitting 98/53    SaO2: 98 RA  MODE:  Ambulation: 430 ft   POST:  Rate/Rhythm: 80 SR    BP: sitting 88/52     SaO2: 98 RA  Pt weak from inactivity but felt well. Enjoyed walking. HR stable. BP slightly low. To recliner. Began ed. She is interested in [facility] and will send referral to [place]. Will f/u tomorrow. Discussed pts A1C and left DM diet.  [place], ACSM 03/27/2016 3:16 PM

## 2016-03-27 NOTE — Discharge Instructions (Addendum)
Information on my medicine - ELIQUIS (apixaban)  Why was Eliquis prescribed for you? Eliquis was prescribed for you to reduce the risk of a blood clot forming that can cause a stroke if you have a medical condition called atrial fibrillation (a type of irregular heartbeat).  What do You need to know about Eliquis ? Take your Eliquis TWICE DAILY - one tablet in the morning and one tablet in the evening with or without food. If you have difficulty swallowing the tablet whole please discuss with your pharmacist how to take the medication safely.  Take Eliquis exactly as prescribed by your doctor and DO NOT stop taking Eliquis without talking to the doctor who prescribed the medication.  Stopping may increase your risk of developing a stroke.  Refill your prescription before you run out.  After discharge, you should have regular check-up appointments with your healthcare provider that is prescribing your Eliquis.  In the future your dose may need to be changed if your kidney function or weight changes by a significant amount or as you get older.  What do you do if you miss a dose? If you miss a dose, take it as soon as you remember on the same day and resume taking twice daily.  Do not take more than one dose of ELIQUIS at the same time to make up a missed dose.  Important Safety Information A possible side effect of Eliquis is bleeding. You should call your healthcare provider right away if you experience any of the following: ? Bleeding from an injury or your nose that does not stop. ? Unusual colored urine (red or dark brown) or unusual colored stools (red or black). ? Unusual bruising for unknown reasons. ? A serious fall or if you hit your head (even if there is no bleeding).  Some medicines may interact with Eliquis and might increase your risk of bleeding or clotting while on Eliquis. To help avoid this, consult your healthcare provider or pharmacist prior to using any new  prescription or non-prescription medications, including herbals, vitamins, non-steroidal anti-inflammatory drugs (NSAIDs) and supplements.  This website has more information on Eliquis (apixaban): http://www.eliquis.com/eliquis/home  Ticagrelor oral tablet What is this medicine? TICAGRELOR (TYE ka GREL or) helps to prevent blood clots. This medicine is used to prevent heart attack, stroke, or other vascular events in people who have had a recent heart attack or who have severe chest pain. COMMON BRAND NAME(S): BRILINTA What should I tell my health care provider before I take this medicine? They need to know if you have any of these conditions: -bleeding disorders -bleeding in the brain -having surgery -history of irregular heartbeat -history of stomach bleeding -liver disease -an unusual or allergic reaction to ticagrelor, other medicines, foods, dyes, or preservatives -pregnant or trying to get pregnant -breast-feeding How should I use this medicine? Take this medicine by mouth with a glass of water. Follow the directions on the prescription label. You can take it with or without food. If it upsets your stomach, take it with food. Take your medicine at regular intervals. Do not take it more often than directed. Do not stop taking except on your doctor's advice. Talk to you pediatrician regarding the use of this medicine in children. Special care may be needed. What if I miss a dose? If you miss a dose, take it as soon as you can. If it is almost time for your next dose, take only that dose. Do not take double or extra doses. What  may interact with this medicine? -certain antibiotics like clarithromycin and telithromycin -certain medicines for fungal infections like itraconazole, ketoconazole, and voriconazole -certain medicines for HIV infection like atazanavir, indinavir, nelfinavir, ritonavir, and saquinavir -certain medicines for seizures like carbamazepine, phenobarbital, and  phenytoin -certain medicines that treat or prevent blood clots like warfarin -dexamethasone -digoxin -lovastatin -nefazodone -rifampin -simvastatin What should I watch for while using this medicine? Visit your doctor or health care professional for regular check ups. Do not stop taking you medicine unless your doctor tells you to. Notify your doctor or health care professional and seek emergency treatment if you develop breathing problems; changes in vision; chest pain; severe, sudden headache; pain, swelling, warmth in the leg; trouble speaking; sudden numbness or weakness of the face, arm, or leg. These can be signs that your condition has gotten worse. If you are going to have surgery or dental work, tell your doctor or health care professional that you are taking this medicine. You should take aspirin every day with this medicine. Do not take more than 100 mg each day. Talk to your doctor if you have questions. What side effects may I notice from receiving this medicine? Side effects that you should report to your doctor or health care professional as soon as possible: -allergic reactions like skin rash, itching or hives, swelling of the face, lips, or tongue -breathing problems -fast or irregular heartbeat -feeling faint or light-headed, falls -signs and symptoms of bleeding such as bloody or black, tarry stools; red or dark-brown urine; spitting up blood or brown material that looks like coffee grounds; red spots on the skin; unusual bruising or bleeding from the eye, gums, or nose Side effects that usually do not require medical attention (report to your doctor or health care professional if they continue or are bothersome): -breast enlargement in both males and females -diarrhea -dizziness -headache -tiredness -upset stomach Where should I keep my medicine? Keep out of the reach of children. Store at room temperature of 59 to 86 degrees F (15 to 30 degrees C). Throw away any  unused medicine after the expiration date.  2017 Elsevier/Gold Standard (2015-05-20 11:53:14)

## 2016-03-27 NOTE — Progress Notes (Signed)
Patient Name: April [name] Date of Encounter: 03/27/2016  Primary Cardiologist: Dr. Beckie Salts Problem List     Principal Problem:   ST elevation myocardial infarction (STEMI) of inferolateral wall, initial episode of care [facility]) Active Problems:   Essential hypertension, benign   Hypercholesterolemia   Obstructive sleep apnea   Obesity (BMI 30-39.9)   CAD S/P PCI pRCA Promus DES 3.5 x 16 (4.1 mm), ostRPDA Promus DES 2.5 x 12 (2.7 mm)     Subjective   No CP or palpitations.  Has had palpitations in the past but not diagnosed with AFib specifically.  Was not on anticoagulation.  Her mother took warfarin.   Inpatient Medications    Scheduled Meds: . amLODipine  2.5 mg Oral Daily  . apixaban  5 mg Oral BID  . aspirin  81 mg Oral Daily  . clopidogrel  300 mg Oral Once  . [START ON 03/28/2016] clopidogrel  75 mg Oral Daily  . fluticasone  2 spray Each Nare Daily  . fluticasone furoate-vilanterol  1 puff Inhalation Daily  . furosemide  20 mg Oral Daily  . losartan  100 mg Oral Daily  . metoprolol tartrate  50 mg Oral Q6H  . multivitamin with minerals  1 tablet Oral Daily  . omega-3 acid ethyl esters  1 g Oral Daily  . pantoprazole  40 mg Oral Daily  . potassium chloride  40 mEq Oral BID  . rosuvastatin  40 mg Oral Daily  . sodium chloride flush  3 mL Intravenous Q12H   Continuous Infusions: . amiodarone 30 mg/hr (03/27/16 0830)   PRN Meds: sodium chloride, acetaminophen, albuterol, ondansetron (ZOFRAN) IV, sodium chloride flush   Vital Signs    Vitals:   03/27/16 0800 03/27/16 0900 03/27/16 1000 03/27/16 1137  BP: 94/66 93/66  112/61  Pulse: (!) 104 98 (!) 106 63  Resp: (!) 21 18 17  (!) 21  Temp:    98.3 F (36.8 C)  TempSrc:    Oral  SpO2: 97% 97% 98% 97%  Weight:        Intake/Output Summary (Last 24 hours) at 03/27/16 1151 Last data filed at 03/27/16 1142  Gross per 24 hour  Intake           685.31 ml  Output             1750 ml  Net          -1064.69 ml   Filed Weights   03/26/16 0337 03/27/16 0345  Weight: 183 lb 6.8 oz (83.2 kg) 177 lb 1.6 oz (80.3 kg)    Physical Exam   GEN: Well nourished, well developed, in no acute distress.  HEENT: Grossly normal.  Neck: Supple, no JVD, carotid bruits, or masses. Cardiac: irregularly irregular, no murmurs, rubs, or gallops. No clubbing, cyanosis, edema.  Radials/DP/PT 2+ and equal bilaterally.  Respiratory:  Respirations regular and unlabored, clear to auscultation bilaterally. GI: Soft, nontender, nondistended, BS + x 4. MS: no deformity or atrophy. Skin: warm and dry, no rash. Neuro:  Strength and sensation are intact. Psych: AAOx3.  Normal affect.  Labs    CBC  Recent Labs  03/25/16 1808 03/26/16 0253  WBC 11.3* 9.8  NEUTROABS 7.3*  --   HGB 13.1 11.2*  HCT 39.1 33.7*  MCV 91.6 91.8  PLT 239 366   Basic Metabolic Panel  Recent Labs  03/25/16 1808 03/26/16 0253  NA 136 139  K 3.5 3.2*  CL 101 105  CO2 27 27  GLUCOSE 161* 163*  BUN 17 11  CREATININE 0.86 0.82  CALCIUM 9.1 8.5*   Liver Function Tests  Recent Labs  03/25/16 1808 03/26/16 0253  AST 38 76*  ALT 26 26  ALKPHOS 66 53  BILITOT 0.6 0.4  PROT 7.5 5.1*  ALBUMIN 4.0 2.9*   No results for input(s): LIPASE, AMYLASE in the last 72 hours. Cardiac Enzymes  Recent Labs  03/25/16 2230 03/26/16 0253 03/26/16 0945  TROPONINI 4.96* 11.40* 14.25*   BNP Invalid input(s): POCBNP D-Dimer No results for input(s): DDIMER in the last 72 hours. Hemoglobin A1C  Recent Labs  03/26/16 0253  HGBA1C 6.2*   Fasting Lipid Panel  Recent Labs  03/26/16 0253  CHOL 133  HDL 53  LDLCALC 63  TRIG 87  CHOLHDL 2.5   Thyroid Function Tests No results for input(s): TSH, T4TOTAL, T3FREE, THYROIDAB in the last 72 hours.  Invalid input(s): FREET3  Telemetry    AFib with RVR - Personally Reviewed  ECG    NSR, lateral ST elevation on 11/25 - Personally Reviewed  Radiology    Dg Chest  Port 1 View  Result Date: 03/25/2016 CLINICAL DATA:  Centralized chest pain, weakness in right arm. EXAM: PORTABLE CHEST 1 VIEW COMPARISON:  Chest x-ray dated 10/11/2012. FINDINGS: Heart size is upper normal, stable. Overall cardiomediastinal silhouette appears stable. There is mild central pulmonary vascular congestion without overt alveolar pulmonary edema. Lungs otherwise clear. No pleural effusion or pneumothorax seen. Again noted is a hiatal hernia. Osseous structures about the chest are unremarkable. Atherosclerotic changes again noted at the aortic arch. IMPRESSION: 1. Mild central pulmonary vascular congestion, possibly related to resuscitation efforts. Lungs otherwise clear. No overt alveolar pulmonary edema. 2. Hiatal hernia. 3. Aortic atherosclerosis. Electronically Signed   By: Franki Cabot M.D.   On: 03/25/2016 18:28    Cardiac Studies   1 RCA, 1 PDA stents placed  Patient Profile     75 y/o with inferolateral MI, AFib  Assessment & Plan    1) Increase metoprolol.  Stop Lovenox.  Start Elquis.  We discussed other options like warfarin and Xarelto.    Continue Amio.  Stop Brilinta.  Start Plavix 300 mg PO x 1 and then 75 mg daily.   Signed, Larae Grooms, MD  03/27/2016, 11:51 AM

## 2016-03-28 ENCOUNTER — Encounter
Admission: RE | Admit: 2016-03-28 | Discharge: 2016-03-28 | Disposition: A | Discharge status: Home / Self Care | Payer: Medicare Other | Source: Ambulatory Visit | Attending: Internal Medicine | Admitting: Internal Medicine

## 2016-03-28 ENCOUNTER — Encounter (HOSPITAL_COMMUNITY): Payer: Self-pay | Admitting: Cardiology

## 2016-03-28 DIAGNOSIS — I4891 Unspecified atrial fibrillation: Secondary | ICD-10-CM

## 2016-03-28 LAB — BASIC METABOLIC PANEL
Anion gap: 7 (ref 5–15)
BUN: 22 mg/dL — AB (ref 6–20)
CALCIUM: 8.6 mg/dL — AB (ref 8.9–10.3)
CO2: 25 mmol/L (ref 22–32)
CREATININE: 1.01 mg/dL — AB (ref 0.44–1.00)
Chloride: 105 mmol/L (ref 101–111)
GFR calc Af Amer: >60 mL/min (ref >60)
GFR, EST NON AFRICAN AMERICAN: 52 mL/min — AB (ref >60)
GLUCOSE: 119 mg/dL — AB (ref 65–99)
Potassium: 3.9 mmol/L (ref 3.5–5.1)
SODIUM: 137 mmol/L (ref 135–145)

## 2016-03-28 LAB — GLUCOSE, CAPILLARY
GLUCOSE-CAPILLARY: 100 mg/dL — AB (ref 65–99)
GLUCOSE-CAPILLARY: 120 mg/dL — AB (ref 65–99)
Glucose-Capillary: 126 mg/dL — ABNORMAL HIGH (ref 65–99)

## 2016-03-28 MED ORDER — AMIODARONE HCL 200 MG PO TABS
200.0000 mg | ORAL_TABLET | Freq: Every day | ORAL | Status: DC
  Administered 2016-03-28 – 2016-03-29 (×2): 200 mg via ORAL
  Filled 2016-03-28 (×2): qty 1

## 2016-03-28 MED ORDER — METOPROLOL TARTRATE 50 MG PO TABS
100.0000 mg | ORAL_TABLET | Freq: Two times a day (BID) | ORAL | Status: DC
  Administered 2016-03-28 – 2016-03-29 (×2): 100 mg via ORAL
  Filled 2016-03-28 (×2): qty 2

## 2016-03-28 MED ORDER — SALINE SPRAY 0.65 % NA SOLN
1.0000 | NASAL | Status: DC | PRN
  Filled 2016-03-28: qty 44

## 2016-03-28 NOTE — Progress Notes (Signed)
CARDIAC REHAB PHASE I   PRE:  Rate/Rhythm: 67 SR    BP: sitting 131/59    SaO2: 95 RA  MODE:  Ambulation: 470 ft   POST:  Rate/Rhythm: 81 SR    BP: sitting 140/53     SaO2:   Pt walked 40 ft without RW but felt weak. She felt much better/more stamina with RW. Able to walk long distance, steady with RW. She required x2 short rests and was fatigued at end of walk. Seems like some of her fatigue is from prolonged bedrest and also recently not doing as much at home. Ed completed upon return to room, including walking guidelines. Voiced understanding.  [place], ACSM 03/28/2016 3:11 PM

## 2016-03-28 NOTE — NC FL2 (Signed)
MEDICAID FL2 LEVEL OF CARE SCREENING TOOL     IDENTIFICATION  Patient Name: April [name] Birthdate: 02/08/1941 Sex: female Admission Date (Current Location): 03/25/2016  [facility] and [place] Number:  Engineering geologist and Address:  [facility] [place] [place] [address] [place], [place] [address]      Provider Number: [id number]  Attending Physician Name and Address:  Leonie Man, MD  Relative Name and Phone Number:  Carmin [place] [phone]    Current Level of Care: Hospital Recommended Level of Care: [place] Prior Approval Number:    Date Approved/Denied:   PASRR Number: [id number]  Discharge Plan: SNF    Current Diagnoses: Patient Active Problem List   Diagnosis Date Noted  . New onset atrial fibrillation ([place]) 03/27/2016  . ST elevation myocardial infarction (STEMI) of inferolateral wall, initial episode of care ([place]) 03/25/2016  . CAD S/P PCI pRCA Promus DES 3.5 x 16 (4.1 mm), ostRPDA Promus DES 2.5 x 12 (2.7 mm) 03/25/2016  . Right knee pain 11/13/2015  . Coronary artery disease involving native coronary artery of native heart with unstable angina pectoris ([place]) 06/01/2015  . Diabetes mellitus ([place]) 05/16/2015  . Gastritis 04/01/2015  . Chest pain 04/01/2015  . Hiatal hernia 08/25/2014  . DOE (dyspnea on exertion) 07/15/2014  . Congestion of throat 07/06/2014  . Health care maintenance 07/06/2014  . Fibrocystic breast disease 07/06/2014  . Obesity (BMI 30-39.9) 04/19/2014  . Stress 02/16/2013  . Rib pain on right side 02/16/2013  . SOB (shortness of breath) 10/13/2012  . GERD (gastroesophageal reflux disease) 10/13/2012  . Essential hypertension, benign 08/18/2012  . Hypercholesterolemia 08/18/2012  . History of colonic polyps 08/18/2012  . Obstructive sleep apnea 08/18/2012  . Osteoporosis 08/18/2012    Orientation RESPIRATION BLADDER Height & Weight     Self, Time, Situation, Place  Normal  Continent Weight: 80.3 kg (177 lb 1.6 oz) Height:     BEHAVIORAL SYMPTOMS/MOOD NEUROLOGICAL BOWEL NUTRITION STATUS      Continent Diet (Please see DC Summary)  AMBULATORY STATUS COMMUNICATION OF NEEDS Skin   Limited Assist Verbally Normal                       Personal Care Assistance Level of Assistance  Bathing, Feeding, Dressing Bathing Assistance: Limited assistance Feeding assistance: Independent Dressing Assistance: Limited assistance     Functional Limitations Info             SPECIAL CARE FACTORS FREQUENCY  PT (By licensed PT)     PT Frequency: not yet assessed              Contractures      Additional Factors Info  Code Status, Allergies Code Status Info: Full Allergies Info: Penicillins, Sulfa Antibiotics           Current Medications (03/28/2016):  This is the current hospital active medication list Current Facility-Administered Medications  Medication Dose Route Frequency Provider Last Rate Last Dose  . 0.9 %  sodium chloride infusion  250 mL Intravenous PRN Leonie Man, MD      . acetaminophen (TYLENOL) tablet 650 mg  650 mg Oral Q4H PRN Leonie Man, MD      . albuterol (PROVENTIL) (2.5 MG/3ML) 0.083% nebulizer solution 2.5 mg  2.5 mg Inhalation Q4H PRN Leonie Man, MD      . amiodarone (PACERONE) tablet 200 mg  200 mg Oral Daily Jettie Booze, MD  200 mg at 03/28/16 0957  . amLODipine (NORVASC) tablet 2.5 mg  2.5 mg Oral Daily Deboraha Sprang, MD   2.5 mg at 03/28/16 0958  . apixaban (ELIQUIS) tablet 5 mg  5 mg Oral BID Jettie Booze, MD   5 mg at 03/28/16 0959  . clopidogrel (PLAVIX) tablet 75 mg  75 mg Oral Daily Jettie Booze, MD   75 mg at 03/28/16 0958  . fluticasone (FLONASE) 50 MCG/ACT nasal spray 2 spray  2 spray Each Nare Daily Ricka Burdock, RPH   2 spray at 03/27/16 2150  . fluticasone furoate-vilanterol (BREO ELLIPTA) 100-25 MCG/INH 1 puff  1 puff Inhalation Daily Leonie Man, MD   1 puff at  03/26/16 2234  . furosemide (LASIX) tablet 20 mg  20 mg Oral Daily Leonie Man, MD   20 mg at 03/28/16 0958  . losartan (COZAAR) tablet 100 mg  100 mg Oral Daily Leonie Man, MD   100 mg at 03/28/16 5374  . metoprolol (LOPRESSOR) tablet 100 mg  100 mg Oral BID Jettie Booze, MD      . multivitamin with minerals tablet 1 tablet  1 tablet Oral Daily Leonie Man, MD   1 tablet at 03/28/16 0957  . omega-3 acid ethyl esters (LOVAZA) capsule 1 g  1 g Oral Daily Leonie Man, MD   1 g at 03/28/16 [phone]  . ondansetron (ZOFRAN) injection 4 mg  4 mg Intravenous Q6H PRN Leonie Man, MD      . pantoprazole (PROTONIX) EC tablet 40 mg  40 mg Oral Daily Leonie Man, MD   40 mg at 03/28/16 0958  . rosuvastatin (CRESTOR) tablet 40 mg  40 mg Oral Daily Leonie Man, MD   40 mg at 03/28/16 0959  . sodium chloride (OCEAN) 0.65 % nasal spray 1 spray  1 spray Each Nare PRN Jettie Booze, MD      . sodium chloride flush (NS) 0.9 % injection 3 mL  3 mL Intravenous Q12H Leonie Man, MD   3 mL at 03/28/16 0959  . sodium chloride flush (NS) 0.9 % injection 3 mL  3 mL Intravenous PRN Leonie Man, MD         Discharge Medications: Please see discharge summary for a list of discharge medications.  Relevant Imaging Results:  Relevant Lab Results:   Additional Information SSN: [id number]  [place], [place]

## 2016-03-28 NOTE — Clinical Social Work Note (Signed)
Clinical Social Work Assessment  Patient Details  Name: April [name] MRN: [id number] Date of Birth: 1940/05/16  Date of referral:  03/28/16               Reason for consult:  Facility Placement                Permission sought to share information with:  Chartered certified accountant granted to share information::  Yes, Verbal Permission Granted  Name::        Agency::  [facility]  Relationship::     Contact Information:     Housing/Transportation Living arrangements for the past 2 months:  Single Family Home Source of Information:  Patient Patient Interpreter Needed:  None Criminal Activity/Legal Involvement Pertinent to Current Situation/Hospitalization:  No - Comment as needed Significant Relationships:  Adult Children Lives with:  Self Do you feel safe going back to the place where you live?  No Need for family participation in patient care:  No (Coment)  Care giving concerns:  CSW received consult regarding discharge planning. Patient states that she has been planning on moving into the [facility] in [place], but they have not had availability yet. Patient reports she would like to discharge to [facility] for rehab and then transition to [facility]. CSW to follow for discharge planning needs.    Social Worker assessment / plan:  CSW spoke with patient concerning possibility of rehab at [facility] [place]. PT to see patient.  Employment status:  Retired Forensic scientist:  Medicare PT Recommendations:  Not assessed at this time Information / Referral to community resources:  [place]  Patient/Family's Response to care:  Patient recognizes need for rehab before transitioning to her new facility.  Patient/Family's Understanding of and Emotional Response to Diagnosis, Current Treatment, and Prognosis:  Patient/family is realistic regarding therapy needs and expressed being hopeful for SNF placement. Patient expressed understanding of CSW role and  discharge process. No questions/concerns about plan or treatment.    Emotional Assessment Appearance:  Appears stated age Attitude/Demeanor/Rapport:  Other (Appropriate) Affect (typically observed):  Accepting, Appropriate Orientation:  Oriented to Self, Oriented to Place, Oriented to  Time, Oriented to Situation Alcohol / Substance use:  Not Applicable Psych involvement (Current and /or in the community):  No (Comment)  Discharge Needs  Concerns to be addressed:  Care Coordination Readmission within the last 30 days:  No Current discharge risk:  None Barriers to Discharge:  No Barriers Identified   [name], [place] 03/28/2016, 11:15 AM

## 2016-03-28 NOTE — Progress Notes (Signed)
Patient Name: April [name] Date of Encounter: 03/28/2016  Primary Cardiologist: Dr. Beckie Salts Problem List     Principal Problem:   ST elevation myocardial infarction (STEMI) of inferolateral wall, initial episode of care [facility]) Active Problems:   Essential hypertension, benign   Hypercholesterolemia   Obstructive sleep apnea   Obesity (BMI 30-39.9)   CAD S/P PCI pRCA Promus DES 3.5 x 16 (4.1 mm), ostRPDA Promus DES 2.5 x 12 (2.7 mm)   New onset atrial fibrillation (HCC)     Subjective   No CP or palpitations.  Converted to NSR from AFib yesterday.  Was not on anticoagulation.  Started on Eliquis.  Had a nosebleed with blowing her nose. Switched to Plavix as well.   Inpatient Medications    Scheduled Meds: . amiodarone  200 mg Oral Daily  . amLODipine  2.5 mg Oral Daily  . apixaban  5 mg Oral BID  . clopidogrel  75 mg Oral Daily  . fluticasone  2 spray Each Nare Daily  . fluticasone furoate-vilanterol  1 puff Inhalation Daily  . furosemide  20 mg Oral Daily  . losartan  100 mg Oral Daily  . metoprolol tartrate  100 mg Oral BID  . multivitamin with minerals  1 tablet Oral Daily  . omega-3 acid ethyl esters  1 g Oral Daily  . pantoprazole  40 mg Oral Daily  . rosuvastatin  40 mg Oral Daily  . sodium chloride flush  3 mL Intravenous Q12H   Continuous Infusions:  PRN Meds: sodium chloride, acetaminophen, albuterol, ondansetron (ZOFRAN) IV, sodium chloride, sodium chloride flush   Vital Signs    Vitals:   03/28/16 0553 03/28/16 0700 03/28/16 0749 03/28/16 0800  BP: (!) 129/54 (!) 127/59 (!) 127/59 122/65  Pulse: 61 (!) 52 62 (!) 57  Resp:  17 13 19   Temp:   98.4 F (36.9 C)   TempSrc:   Oral   SpO2:  96% 100% 96%  Weight:        Intake/Output Summary (Last 24 hours) at 03/28/16 0952 Last data filed at 03/27/16 2300  Gross per 24 hour  Intake            390.3 ml  Output              600 ml  Net           -209.7 ml   Filed Weights   03/26/16  0337 03/27/16 0345  Weight: 183 lb 6.8 oz (83.2 kg) 177 lb 1.6 oz (80.3 kg)    Physical Exam   GEN: Well nourished, well developed, in no acute distress.  HEENT: Grossly normal.  Neck: Supple, no JVD, carotid bruits, or masses. Cardiac: RRR, systolic murmur, no rubs, or gallops. No clubbing, cyanosis, edema.  Radials/DP/PT 2+ and equal bilaterally. No radial hematoma Respiratory:  Respirations regular and unlabored, clear to auscultation bilaterally. GI: Soft, nontender, nondistended, BS + x 4. MS: no deformity or atrophy. Skin: warm and dry, no rash. Neuro:  Strength and sensation are intact. Psych: AAOx3.  Normal affect.  Labs    CBC  Recent Labs  03/25/16 1808 03/25/16 1927 03/26/16 0253  WBC 11.3*  --  9.8  NEUTROABS 7.3*  --   --   HGB 13.1 12.6 11.2*  HCT 39.1 37.0 33.7*  MCV 91.6  --  91.8  PLT 239  --  335   Basic Metabolic Panel  Recent Labs  03/26/16 0253 03/28/16 0218  NA  139 137  K 3.2* 3.9  CL 105 105  CO2 27 25  GLUCOSE 163* 119*  BUN 11 22*  CREATININE 0.82 1.01*  CALCIUM 8.5* 8.6*   Liver Function Tests  Recent Labs  03/25/16 1808 03/26/16 0253  AST 38 76*  ALT 26 26  ALKPHOS 66 53  BILITOT 0.6 0.4  PROT 7.5 5.1*  ALBUMIN 4.0 2.9*   No results for input(s): LIPASE, AMYLASE in the last 72 hours. Cardiac Enzymes  Recent Labs  03/25/16 2230 03/26/16 0253 03/26/16 0945  TROPONINI 4.96* 11.40* 14.25*   BNP Invalid input(s): POCBNP D-Dimer No results for input(s): DDIMER in the last 72 hours. Hemoglobin A1C  Recent Labs  03/26/16 0253  HGBA1C 6.2*   Fasting Lipid Panel  Recent Labs  03/26/16 0253  CHOL 133  HDL 53  LDLCALC 63  TRIG 87  CHOLHDL 2.5   Thyroid Function Tests No results for input(s): TSH, T4TOTAL, T3FREE, THYROIDAB in the last 72 hours.  Invalid input(s): FREET3  Telemetry    NSR  - Personally Reviewed  ECG    NSR, lateral ST elevation on 11/25 - Personally Reviewed  Radiology    No  results found.  Cardiac Studies   1 RCA, 1 PDA stents placed  Patient Profile     75 y/o with inferolateral MI, AFib- back in NSR with IV Amio  Assessment & Plan    1) Change metoprolol to 100 mg BID.  Continue Elquis.     Stop IV Amio. Continue Amio 200 mg PO daily.  She felt better yesterday when walking while in NSR, so I think it is reasonable to try to keep her in NSR.  Continue Plavix  75 mg daily. Stop aspirin given recent nose bleed since starting Eliquis.    She is close to discharge.  Will see if she can go back to her facility today.  Will have her walk in the unit and see if she needs HHPT, or PT at her facility.  Lipids well controlled. Potassium replaced.    Signed, Larae Grooms, MD  03/28/2016, 9:52 AM

## 2016-03-28 NOTE — Research (Signed)
[place] Informed Consent   Subject Name: April [name]  Subject met inclusion and exclusion criteria.  The informed consent form, study requirements and expectations were reviewed with the subject and questions and concerns were addressed prior to the signing of the consent form.  The subject verbalized understanding of the trail requirements.  The subject agreed to participate in the [facility] trial and signed the informed consent.  The informed consent was obtained prior to performance of any protocol-specific procedures for the subject.  A copy of the signed informed consent was given to the subject and a copy was placed in the subject's medical record.  Mable Fill, Marissa Nestle 03/28/2016, 12:15pm

## 2016-03-28 NOTE — Progress Notes (Signed)
Pt lives at home but had paid money and in process of moving to [facility] but they have all levels of care. Pt would like to go to snf at [facility]. Gave sw name of [facility] rep kimberly page [phone] to see if pt can go into rehab bed. Poss dc today.

## 2016-03-28 NOTE — Evaluation (Signed)
Physical Therapy Evaluation Patient Details Name: April [name] MRN: [id number] DOB: 16-Jan-1941 Today's Date: 03/28/2016   History of Present Illness  75 year old woman with a history of moderate, nonobstructive CAD by cardiac catheter ablation 2011 and negative Myoview in April of this year who presented to the [facility] with chest pain with EKG suggesting inferolateral STEMI. Pt s/p L heart cath.  Clinical Impression  Pt was indep PTA now requires minA for safe ambulation due to deconditioning, impaired balance, and decreased activity tolerance. Pt to benefit from ST-SNF to acheve safe mod I level of function for safe d/c back home.    Follow Up Recommendations SNF;Supervision/Assistance - 24 hour    Equipment Recommendations  Rolling walker with 5" wheels    Recommendations for Other Services       Precautions / Restrictions Precautions Precautions: Fall Restrictions Weight Bearing Restrictions: No      Mobility  Bed Mobility               General bed mobility comments: pt up in chair upon PT arrival  Transfers Overall transfer level: Needs assistance Equipment used: None Transfers: Sit to/from Stand Sit to Stand: Min guard         General transfer comment: pt required to utilize rocking momentum, increased time and min guard to steady pt upon initial standing  Ambulation/Gait Ambulation/Gait assistance: Min assist Ambulation Distance (Feet): 120 Feet Assistive device: 1 person hand held assist;None Gait Pattern/deviations: Step-through pattern;Staggering left;Staggering right Gait velocity: slow and guarded Gait velocity interpretation: Below normal speed for age/gender General Gait Details: began ambulation without device however pt very guarded and staggering L/R reaching for hallway rail. Pt then given R HHA, pt providing decent amount of assist through R UE into PTs hand. Pt reports feeling more steady however pt still with onset of  fatigue requiring standing rest breaks and occasional crossover gait pattern requiring minA to maintain balance  Stairs            Wheelchair Mobility    Modified Rankin (Stroke Patients Only)       Balance Overall balance assessment: Needs assistance         Standing balance support: No upper extremity supported Standing balance-Leahy Scale: Poor Standing balance comment: pt unsteady without single UE support                             Pertinent Vitals/Pain Pain Assessment: No/denies pain    Home Living Family/patient expects to be discharged to:: Skilled nursing facility                 Additional Comments: pt in process of moving from house to a cottage at an indep living facility    Prior Function Level of Independence: Independent         Comments: used a cane for long distance amb     Hand Dominance   Dominant Hand: Right    Extremity/Trunk Assessment   Upper Extremity Assessment: Generalized weakness           Lower Extremity Assessment: Generalized weakness (bilat lower leg edeam)      Cervical / Trunk Assessment: Normal  Communication   Communication: No difficulties  Cognition Arousal/Alertness: Awake/alert Behavior During Therapy: WFL for tasks assessed/performed Overall Cognitive Status: Within Functional Limits for tasks assessed  General Comments General comments (skin integrity, edema, etc.): bilat Lower leg/ankle edema    Exercises     Assessment/Plan    PT Assessment Patient needs continued PT services  PT Problem List Decreased strength;Decreased range of motion;Decreased activity tolerance;Decreased balance;Decreased mobility          PT Treatment Interventions DME instruction;Gait training;Stair training;Functional mobility training;Therapeutic activities;Therapeutic exercise;Balance training    PT Goals (Current goals can be found in the Care Plan section)  Acute  Rehab PT Goals Patient Stated Goal: rehab then home to cottage PT Goal Formulation: With patient Time For Goal Achievement: 04/04/16 Potential to Achieve Goals: Good Additional Goals Additional Goal #1: Pt to score >19 on DGI to indicate minimal falls risk.    Frequency Min 2X/week   Barriers to discharge Decreased caregiver support lives alone    Co-evaluation               End of Session Equipment Utilized During Treatment: Gait belt Activity Tolerance: Patient tolerated treatment well Patient left: in chair;with call bell/phone within reach Nurse Communication: Mobility status         Time: 1212-1224 PT Time Calculation (min) (ACUTE ONLY): 12 min   Charges:   PT Evaluation $PT Eval Low Complexity: 1 Procedure     PT G Codes:        Rutha Melgoza M Mckynleigh Mussell 03/28/2016, 1:46 PM   Kittie Plater, PT, DPT Pager #: [phone] Office #: [phone]

## 2016-03-29 ENCOUNTER — Telehealth: Payer: Self-pay | Admitting: *Deleted

## 2016-03-29 DIAGNOSIS — G4733 Obstructive sleep apnea (adult) (pediatric): Secondary | ICD-10-CM | POA: Diagnosis not present

## 2016-03-29 DIAGNOSIS — G933 Postviral fatigue syndrome: Secondary | ICD-10-CM | POA: Diagnosis not present

## 2016-03-29 DIAGNOSIS — I10 Essential (primary) hypertension: Secondary | ICD-10-CM | POA: Diagnosis not present

## 2016-03-29 DIAGNOSIS — I4891 Unspecified atrial fibrillation: Secondary | ICD-10-CM | POA: Diagnosis not present

## 2016-03-29 DIAGNOSIS — E7800 Pure hypercholesterolemia, unspecified: Secondary | ICD-10-CM | POA: Diagnosis not present

## 2016-03-29 DIAGNOSIS — I2510 Atherosclerotic heart disease of native coronary artery without angina pectoris: Secondary | ICD-10-CM | POA: Diagnosis not present

## 2016-03-29 DIAGNOSIS — I25110 Atherosclerotic heart disease of native coronary artery with unstable angina pectoris: Secondary | ICD-10-CM | POA: Diagnosis not present

## 2016-03-29 DIAGNOSIS — I2119 ST elevation (STEMI) myocardial infarction involving other coronary artery of inferior wall: Secondary | ICD-10-CM | POA: Diagnosis not present

## 2016-03-29 DIAGNOSIS — I2111 ST elevation (STEMI) myocardial infarction involving right coronary artery: Secondary | ICD-10-CM | POA: Diagnosis not present

## 2016-03-29 LAB — GLUCOSE, CAPILLARY
Glucose-Capillary: 111 mg/dL — ABNORMAL HIGH (ref 65–99)
Glucose-Capillary: 95 mg/dL (ref 65–99)
Glucose-Capillary: 100 mg/dL — ABNORMAL HIGH (ref 65–99)

## 2016-03-29 MED ORDER — NITROGLYCERIN 0.4 MG SL SUBL: 0.4000 mg | SUBLINGUAL_TABLET | SUBLINGUAL | 3 refills | Status: DC | PRN

## 2016-03-29 MED ORDER — APIXABAN 5 MG PO TABS: 5.0000 mg | ORAL_TABLET | Freq: Two times a day (BID) | ORAL | 6 refills | Status: DC

## 2016-03-29 MED ORDER — AMIODARONE HCL 200 MG PO TABS: 200.0000 mg | ORAL_TABLET | Freq: Every day | ORAL | 6 refills | Status: DC

## 2016-03-29 MED ORDER — AMLODIPINE BESYLATE 2.5 MG PO TABS: 2.5000 mg | ORAL_TABLET | Freq: Every day | ORAL | 6 refills | Status: DC

## 2016-03-29 MED ORDER — METOPROLOL TARTRATE 100 MG PO TABS: 100.0000 mg | ORAL_TABLET | Freq: Two times a day (BID) | ORAL | 6 refills | Status: DC

## 2016-03-29 MED ORDER — CLOPIDOGREL BISULFATE 75 MG PO TABS: 75.0000 mg | ORAL_TABLET | Freq: Every day | ORAL | 6 refills | Status: DC

## 2016-03-29 MED ORDER — PANTOPRAZOLE SODIUM 40 MG PO TBEC: 40.0000 mg | DELAYED_RELEASE_TABLET | Freq: Every day | ORAL | 6 refills | Status: DC

## 2016-03-29 MED ORDER — ROSUVASTATIN CALCIUM 40 MG PO TABS: 40.0000 mg | ORAL_TABLET | Freq: Every day | ORAL | 6 refills | Status: DC

## 2016-03-29 MED ORDER — FUROSEMIDE 20 MG PO TABS: 20.0000 mg | ORAL_TABLET | Freq: Every day | ORAL | 6 refills | Status: DC

## 2016-03-29 NOTE — Progress Notes (Signed)
Patient Name: April [name] Date of Encounter: 03/29/2016  Primary Cardiologist: Dr. Beckie Salts Problem List     Principal Problem:   ST elevation myocardial infarction (STEMI) of inferolateral wall, initial episode of care [facility]) Active Problems:   Essential hypertension, benign   Hypercholesterolemia   Obstructive sleep apnea   Obesity (BMI 30-39.9)   CAD S/P PCI pRCA Promus DES 3.5 x 16 (4.1 mm), ostRPDA Promus DES 2.5 x 12 (2.7 mm)   New onset atrial fibrillation (HCC)     Subjective   No CP or palpitations.  Converted to NSR from AFib two days ago.  Was not on anticoagulation.  Started on Eliquis.  Had a nosebleed with blowing her nose. Switched to Plavix as well. Stopped aspirin.  She feels weak.  Looking forward to getting stronger.  Inpatient Medications    Scheduled Meds: . amiodarone  200 mg Oral Daily  . amLODipine  2.5 mg Oral Daily  . apixaban  5 mg Oral BID  . clopidogrel  75 mg Oral Daily  . fluticasone  2 spray Each Nare Daily  . fluticasone furoate-vilanterol  1 puff Inhalation Daily  . furosemide  20 mg Oral Daily  . losartan  100 mg Oral Daily  . metoprolol tartrate  100 mg Oral BID  . multivitamin with minerals  1 tablet Oral Daily  . omega-3 acid ethyl esters  1 g Oral Daily  . pantoprazole  40 mg Oral Daily  . rosuvastatin  40 mg Oral Daily  . sodium chloride flush  3 mL Intravenous Q12H   Continuous Infusions:  PRN Meds: sodium chloride, acetaminophen, albuterol, ondansetron (ZOFRAN) IV, sodium chloride, sodium chloride flush   Vital Signs    Vitals:   03/28/16 2032 03/29/16 0046 03/29/16 0358 03/29/16 0817  BP: (!) 147/80 139/65 (!) 153/70 (!) 133/103  Pulse:      Resp: 18 (!) 21 17 19   Temp: 98.3 F (36.8 C) 98.3 F (36.8 C) 97.7 F (36.5 C) 97.7 F (36.5 C)  TempSrc: Oral Oral Oral Oral  SpO2: 98% 98% 97% 98%  Weight:      Height:        Intake/Output Summary (Last 24 hours) at 03/29/16 1015 Last data filed at  03/28/16 2100  Gross per 24 hour  Intake              480 ml  Output              900 ml  Net             -420 ml   Filed Weights   03/26/16 0337 03/27/16 0345  Weight: 183 lb 6.8 oz (83.2 kg) 177 lb 1.6 oz (80.3 kg)    Physical Exam   GEN: Well nourished, well developed, in no acute distress.  HEENT: Grossly normal.  Neck: Supple, no JVD, carotid bruits, or masses. Cardiac: RRR, systolic murmur, no rubs, or gallops. No clubbing, cyanosis, edema.  Radials/DP/PT 2+ and equal bilaterally. No radial hematoma Respiratory:  Respirations regular and unlabored, clear to auscultation bilaterally. GI: Soft, nontender, nondistended, BS + x 4. MS: no deformity or atrophy. Skin: warm and dry, no rash. Neuro:  Strength and sensation are intact. Psych: AAOx3.  Normal affect.  Labs    CBC No results for input(s): WBC, NEUTROABS, HGB, HCT, MCV, PLT in the last 72 hours. Basic Metabolic Panel  Recent Labs  03/28/16 0218  NA 137  K 3.9  CL 105  CO2 25  GLUCOSE 119*  BUN 22*  CREATININE 1.01*  CALCIUM 8.6*   Liver Function Tests No results for input(s): AST, ALT, ALKPHOS, BILITOT, PROT, ALBUMIN in the last 72 hours. No results for input(s): LIPASE, AMYLASE in the last 72 hours. Cardiac Enzymes No results for input(s): CKTOTAL, CKMB, CKMBINDEX, TROPONINI in the last 72 hours. BNP Invalid input(s): POCBNP D-Dimer No results for input(s): DDIMER in the last 72 hours. Hemoglobin A1C No results for input(s): HGBA1C in the last 72 hours. Fasting Lipid Panel No results for input(s): CHOL, HDL, LDLCALC, TRIG, CHOLHDL, LDLDIRECT in the last 72 hours. Thyroid Function Tests No results for input(s): TSH, T4TOTAL, T3FREE, THYROIDAB in the last 72 hours.  Invalid input(s): FREET3  Telemetry    NSR  - Personally Reviewed  ECG    NSR, lateral ST elevation on 11/25 - Personally Reviewed  Radiology    No results found.  Cardiac Studies   1 RCA, 1 PDA stents placed  Patient  Profile     75 y/o with inferolateral MI, AFib- back in NSR with IV Amio  Assessment & Plan    1) Change metoprolol to 100 mg BID.  Continue Elquis.     Stop IV Amio. Continue Amio 200 mg PO daily.  She felt better yesterday when walking while in NSR, so I think it is reasonable to try to keep her in NSR.  Continue Plavix  75 mg daily. Stopped aspirin given recent nose bleed since starting Eliquis.    Plan for discharge today.  Appreciate PT recs.  Lipids well controlled.     Signed, Larae Grooms, MD  03/29/2016, 10:15 AM

## 2016-03-29 NOTE — Telephone Encounter (Signed)
Patient contacted regarding discharge from [facility] on 03/29/16.  Patient understands to follow up with provider Dr. Rockey Situ on 04/04/16 at 2:00PM at [facility]. Patient understands discharge instructions? Still in hospital Patient understands medications and regiment? Still in hospital Patient understands to bring all medications to this visit? Yes  Instructed patient to please give [place] a call if she has any questions regarding her discharge instructions. She verbalized understanding and confirmed her upcoming appointment. She had no further questions at this time.

## 2016-03-29 NOTE — Progress Notes (Signed)
Pt discharge instructions and education are complete at this time with pt's family at bedside.  PIVs removed and telemetry removed and CCMD notified.  Pt wheeled out and driven to SNF by family member.  No further questions comments or concerns at this time.  Emotional support given.

## 2016-03-29 NOTE — Discharge Summary (Signed)
Discharge Summary    Patient ID: April [name],  MRN: [id number], DOB/AGE: 77-Mar-1942 77 y.o.  Admit date: 03/25/2016 Discharge date: 03/29/2016  Primary Care Provider: Einar [name] Primary Cardiologist: April [name]   Discharge Diagnoses    Principal Problem:   ST elevation myocardial infarction (STEMI) of inferolateral wall, initial episode of care [name]) Active Problems:   Essential hypertension, benign   Hypercholesterolemia   Obstructive sleep apnea   Obesity (BMI 30-39.9)   CAD S/P PCI pRCA Promus DES 3.5 x 16 (4.1 mm), ostRPDA Promus DES 2.5 x 12 (2.7 mm)   New onset atrial fibrillation (HCC)   Allergies Allergies  Allergen Reactions  . Penicillins Other (See Comments)    Okay to take amoxicillin/(pt does not recall what the reaction to penicillin was (75-77 years old)   . Sulfa Antibiotics Rash    Diagnostic Studies/Procedures    LHC: 03/25/16  Conclusion     Prox RCA lesion, 80 %stenosed.  A STENT PROMUS PREM MR 3.5X16 (3.8-4.1 mm) drug eluting stent was successfully placed. Post intervention, there is a 0% residual stenosis.  Ost RPDA lesion, 90 %stenosed.  A STENT PROMUS PREM MR 2.5X12 (2.7 mm) drug eluting stent was successfully placed. Post intervention, there is a 0% residual stenosis.  __________________________________  Prox RCA to Mid RCA lesion, 50 %stenosed. Dist RCA lesion, 30 %stenosed.- Appears smooth and chronic  Dist LAD lesion, 25 %stenosed - diffuse disease in the distal LAD. Nonsignificant  The left ventricular systolic function is normal.  The left ventricular ejection fraction is 55-65% by visual estimate.  There is no mitral valve regurgitation. There is no aortic valve stenosis.   Thankfully, the patient presented chest pain-free indicating likely resolution of 100% occluded vessel. This was found to be the case with a thrombotic 80% proximal RCA as well as a very focal 90% ostial PDA lesion. Both these were treated  with DES stents as non-Primary PCI. The digital mid RCA 50% stenosis was relatively smooth and appears to be chronic. This will be treated medically.  Preserved EF with mildly elevated LVEDP. Severe systemic hypertension noted.  Plan:  Admit to TCU, step down cardiac unit for post MI/PCI care. TR band removal per protocol  Continue Aggrastat infusion until current bag complete  Aspirin plus Brilinta for minimum 3 months. At which time could consider stopping aspirin.  Restart home dose of carvedilol and losartan.   As she is on furosemide, would hold HCTZ.  Increase home Crestor dose to 40 mg.    Diagnostic Diagram     Post-Intervention Diagram       _____________   History of Present Illness     April [name] presented to [name] at roughly 1800 hrs on 03/25/16 with 45 minutes of substernal chest pressure noted to be 8 out of 10 upon arrival. She was noted on EKG to have roughly 3 mm ST elevation in removal 2, III and aVF as well as roughly 1-2 mm ST elevations in roughly the L4 through V6 with reciprocal changes in I and aVL. Prior to this she had noted to have her stable exertional dyspnea but nothing significant. This was the initial episode of this symptom. Code STEMI was called by the EDP and the patient was transferred emergently to [name] for cardiac catheterization. She was chest pain-free upon arrival and received heparin bolus was administered with drip, IV Lopressor and 6 doses of 81 mg aspirin.  Hospital Course  Consultants: Social Work  She was brought to the cath lab emergently and underwent LHC with April [name] showing RCA was  80% stenosed with thrombotic lesion in the proximal RCA as well as 90% lesion and ostial PDA. Successful 2 site PCI with residual mid RCA disease that seems stable. Plan was for aspirin plus Brilinta for minimum 3 months, then could stop ASA. Her Crestor was increased to 40mg  daily. LV function was  reported normal. Trop peaked at 14.25.   The following day she went into AF RVR with no improvement with IV BB and additional dose of PO BB. She was started on IV amiodarone with bolus and infusion, along with Lovenox. Coreg was changed to metoprolol. Her metoprolol was further increased on 11/27 and started on Eliquis. Given this change in medications here Brilinta was stopped and started on Plavix with 300mg  loading dose. She was evaluated by PT who felt she would benefit from SNF placement to achieve safe level of function prior to discharge back home. Did have some mild dyspnea while working with cardiac rehab that improved during admission. She was transitioned to PO amiodarone 200mg  daily on 11/28 after converting to SR.   On 11/29 she was seen and examined by April [name] and determined stable for discharge to SNF. Plans to go to [name] for rehab. Did develop a nosebleed, and ASA was stopped. H/H was stable. I have arranged for follow up in the April [name].  _____________  Discharge Vitals Blood pressure (!) 156/67, pulse 65, temperature 98.2 F (36.8 C), temperature source Oral, resp. rate (!) 22, height 5' (1.524 m), weight 177 lb 1.6 oz (80.3 kg), last menstrual period 05/01/1972, SpO2 100 %.  Filed Weights   03/26/16 0337 03/27/16 0345  Weight: 183 lb 6.8 oz (83.2 kg) 177 lb 1.6 oz (80.3 kg)    Labs & Radiologic Studies    CBC No results for input(s): WBC, NEUTROABS, HGB, HCT, MCV, PLT in the last 72 hours. Basic Metabolic Panel  Recent Labs  03/28/16 0218  NA 137  K 3.9  CL 105  CO2 25  GLUCOSE 119*  BUN 22*  CREATININE 1.01*  CALCIUM 8.6*   Liver Function Tests No results for input(s): AST, ALT, ALKPHOS, BILITOT, PROT, ALBUMIN in the last 72 hours. No results for input(s): LIPASE, AMYLASE in the last 72 hours. Cardiac Enzymes No results for input(s): CKTOTAL, CKMB, CKMBINDEX, TROPONINI in the last 72 hours. BNP Invalid input(s): POCBNP D-Dimer No  results for input(s): DDIMER in the last 72 hours. Hemoglobin A1C No results for input(s): HGBA1C in the last 72 hours. Fasting Lipid Panel No results for input(s): CHOL, HDL, LDLCALC, TRIG, CHOLHDL, LDLDIRECT in the last 72 hours. Thyroid Function Tests No results for input(s): TSH, T4TOTAL, T3FREE, THYROIDAB in the last 72 hours.  Invalid input(s): FREET3 _____________  Dg Chest Port 1 View  Result Date: 03/25/2016 CLINICAL DATA:  Centralized chest pain, weakness in right arm. EXAM: PORTABLE CHEST 1 VIEW COMPARISON:  Chest x-ray dated 10/11/2012. FINDINGS: Heart size is upper normal, stable. Overall cardiomediastinal silhouette appears stable. There is mild central pulmonary vascular congestion without overt alveolar pulmonary edema. Lungs otherwise clear. No pleural effusion or pneumothorax seen. Again noted is a hiatal hernia. Osseous structures about the chest are unremarkable. Atherosclerotic changes again noted at the aortic arch. IMPRESSION: 1. Mild central pulmonary vascular congestion, possibly related to resuscitation efforts. Lungs otherwise clear. No overt alveolar pulmonary edema. 2. Hiatal hernia. 3. Aortic atherosclerosis. Electronically Signed  By: Franki Cabot M.D.   On: 03/25/2016 18:28   Disposition   Pt is being discharged to SNF today in good condition.  Follow-up Plans & Appointments    Follow-up Information    Ida Rogue, MD Follow up on 04/04/2016.   Specialty:  Cardiology Why:  at 1:45pm for your follow up appt.  Contact information: [place] [address] [phone]          Discharge Instructions    Amb Referral to Cardiac Rehabilitation    Complete by:  As directed    Diagnosis:   STEMI PTCA Coronary Stents     Call MD for:  redness, tenderness, or signs of infection (pain, swelling, redness, odor or green/yellow discharge around incision site)    Complete by:  As directed    Diet - low sodium heart healthy     Complete by:  As directed    Discharge instructions    Complete by:  As directed    Radial Site Care Refer to this sheet in the next few weeks. These instructions provide you with information on caring for yourself after your procedure. Your caregiver may also give you more specific instructions. Your treatment has been planned according to current medical practices, but problems sometimes occur. Call your caregiver if you have any problems or questions after your procedure. HOME CARE INSTRUCTIONS You may shower the day after the procedure.Remove the bandage (dressing) and gently wash the site with plain soap and water.Gently pat the site dry.  Do not apply powder or lotion to the site.  Do not submerge the affected site in water for 3 to 5 days.  Inspect the site at least twice daily.  Do not flex or bend the affected arm for 24 hours.  No lifting over 5 pounds (2.3 kg) for 5 days after your procedure.  Do not drive home if you are discharged the same day of the procedure. Have someone else drive you.  You may drive 24 hours after the procedure unless otherwise instructed by your caregiver.  What to expect: Any bruising will usually fade within 1 to 2 weeks.  Blood that collects in the tissue (hematoma) may be painful to the touch. It should usually decrease in size and tenderness within 1 to 2 weeks.  SEEK IMMEDIATE MEDICAL CARE IF: You have unusual pain at the radial site.  You have redness, warmth, swelling, or pain at the radial site.  You have drainage (other than a small amount of blood on the dressing).  You have chills.  You have a fever or persistent symptoms for more than 72 hours.  You have a fever and your symptoms suddenly get worse.  Your arm becomes pale, cool, tingly, or numb.  You have heavy bleeding from the site. Hold pressure on the site.   Increase activity slowly    Complete by:  As directed       Discharge Medications   Current Discharge Medication List      START taking these medications   Details  amiodarone (PACERONE) 200 MG tablet Take 1 tablet (200 mg total) by mouth daily. Qty: 30 tablet, Refills: 6    amLODipine (NORVASC) 2.5 MG tablet Take 1 tablet (2.5 mg total) by mouth daily. Qty: 30 tablet, Refills: 6    apixaban (ELIQUIS) 5 MG TABS tablet Take 1 tablet (5 mg total) by mouth 2 (two) times daily. Qty: 60 tablet, Refills: 6    clopidogrel (PLAVIX)  75 MG tablet Take 1 tablet (75 mg total) by mouth daily. Qty: 30 tablet, Refills: 6    metoprolol (LOPRESSOR) 100 MG tablet Take 1 tablet (100 mg total) by mouth 2 (two) times daily. Qty: 60 tablet, Refills: 6    nitroGLYCERIN (NITROSTAT) 0.4 MG SL tablet Place 1 tablet (0.4 mg total) under the tongue every 5 (five) minutes as needed. Qty: 25 tablet, Refills: 3    pantoprazole (PROTONIX) 40 MG tablet Take 1 tablet (40 mg total) by mouth daily. Qty: 30 tablet, Refills: 6      CONTINUE these medications which have CHANGED   Details  furosemide (LASIX) 20 MG tablet Take 1 tablet (20 mg total) by mouth daily. Qty: 30 tablet, Refills: 6    rosuvastatin (CRESTOR) 40 MG tablet Take 1 tablet (40 mg total) by mouth daily. Qty: 30 tablet, Refills: 6      CONTINUE these medications which have NOT CHANGED   Details  acetaminophen (TYLENOL) 500 MG tablet Take 500 mg by mouth daily as needed for headache (pain).    calcium carbonate (TUMS - DOSED IN MG ELEMENTAL CALCIUM) 500 MG chewable tablet Chew 1-3 tablets by mouth daily as needed for indigestion or heartburn.     cholecalciferol (VITAMIN D) 1000 units tablet Take 1,000 Units by mouth 2 (two) times daily.    fish oil-omega-3 fatty acids 1000 MG capsule Take 1 g by mouth 2 (two) times daily.     fluticasone (FLONASE) 50 MCG/ACT nasal spray Place 1-2 sprays into both nostrils at bedtime.     fluticasone furoate-vilanterol (BREO ELLIPTA) 100-25 MCG/INH AEPB Inhale 1 puff into the lungs daily. Qty: 60 each, Refills: 5     losartan-hydrochlorothiazide (HYZAAR) 100-12.5 MG tablet TAKE ONE (1) TABLET EACH DAY Qty: 30 tablet, Refills: 3    meloxicam (MOBIC) 15 MG tablet Take 15 mg by mouth daily as needed for pain.     Menthol, Topical Analgesic, (BIOFREEZE EX) Apply 1 application topically daily as needed (pain).    Misc Natural Products (OSTEO BI-FLEX JOINT SHIELD PO) Take 1 tablet by mouth 2 (two) times daily.     Multiple Vitamin (MULTIVITAMIN WITH MINERALS) TABS tablet Take 0.5 tablets by mouth 2 (two) times daily.    Polyethyl Glycol-Propyl Glycol (SYSTANE OP) Place 1 drop into both eyes daily as needed (dry eyes).    potassium chloride (K-DUR) 10 MEQ tablet Take 1 tablet (10 mEq total) by mouth daily as needed. Qty: 30 tablet, Refills: 11    PRESCRIPTION MEDICATION Inhale into the lungs at bedtime. CPAP    traMADol (ULTRAM) 50 MG tablet Take 50 mg by mouth daily as needed (pain).    albuterol (PROVENTIL HFA;VENTOLIN HFA) 108 (90 Base) MCG/ACT inhaler Inhale 2 puffs into the lungs every 4 (four) hours as needed for wheezing or shortness of breath. Qty: 1 Inhaler, Refills: 6      STOP taking these medications     aspirin EC 81 MG tablet      carvedilol (COREG) 25 MG tablet      esomeprazole (NEXIUM) 40 MG capsule      naproxen sodium (ALEVE) 220 MG tablet          Aspirin prescribed at discharge?  No: On Eliquis High Intensity Statin Prescribed? (Lipitor 40-80mg  or Crestor 20-40mg ): Yes Beta Blocker Prescribed? Yes For EF <40%, was ACEI/ARB Prescribed? No: Consider in outpatient setting ADP Receptor Inhibitor Prescribed? (i.e. Plavix etc.-Includes Medically Managed Patients): Yes For EF <40%, Aldosterone Inhibitor Prescribed? No:  EF ok Was EF assessed during THIS hospitalization? Yes Was Cardiac Rehab II ordered? (Included Medically managed Patients): Yes   Outstanding Labs/Studies   FLP and LFTs in 6-8 weeks if tolerating statin increase.   Duration of Discharge Encounter    Greater than 30 minutes including physician time.  Signed, Reino Bellis NP-C 03/29/2016, 12:24 PM   I have examined the patient and reviewed assessment and plan and discussed with patient.  Agree with above as stated.  Continue Eliquis and Plavix.  No aspirin.  Amio 200 mg daily to maintain NSR.  Metoprolol as well.  GOing to rehab facility.  Discharge today.  F/u with April [name].  Larae Grooms

## 2016-03-29 NOTE — Clinical Social Work Placement (Signed)
   CLINICAL SOCIAL WORK PLACEMENT  NOTE  Date:  03/29/2016  Patient Details  Name: April [name] MRN: [id number] Date of Birth: 10-20-1940  Clinical Social Work is seeking post-discharge placement for this patient at the [place] level of care (*CSW will initial, date and re-position this form in  chart as items are completed):  Yes   Patient/family provided with [facility] Work Department's list of facilities offering this level of care within the geographic area requested by the patient (or if unable, by the patient's family).  Yes   Patient/family informed of their freedom to choose among providers that offer the needed level of care, that participate in Medicare, Medicaid or managed care program needed by the patient, have an available bed and are willing to accept the patient.  Yes   Patient/family informed of [facility]'s ownership interest in [facility] and [facility], as well as of the fact that they are under no obligation to receive care at these facilities.  PASRR submitted to EDS on 03/28/16     PASRR number received on 03/28/16     Existing PASRR number confirmed on       FL2 transmitted to all facilities in geographic area requested by pt/family on 03/28/16     FL2 transmitted to all facilities within larger geographic area on       Patient informed that his/her managed care company has contracts with or will negotiate with certain facilities, including the following:        Yes   Patient/family informed of bed offers received.  Patient chooses bed at [facility]     Physician recommends and patient chooses bed at      Patient to be transferred to [facility] on 03/29/16.  Patient to be transferred to facility by family     Patient family notified on 03/29/16 of transfer.  Name of family member notified:  at bedside     PHYSICIAN Please sign FL2     Additional Comment:     _______________________________________________ Jorge Ny, LCSW 03/29/2016, 1:06 PM

## 2016-03-29 NOTE — Care Management Important Message (Signed)
Important Message  Patient Details  Name: April [name] MRN: [id number] Date of Birth: 06-22-1940   Medicare Important Message Given:  Yes    Nathen May 03/29/2016, 10:51 AM

## 2016-03-29 NOTE — Telephone Encounter (Signed)
-----   Message from Blain Pais sent at 03/29/2016 12:09 PM EST ----- Regarding: tcm/ph 12/5 2:00 Dr. Rockey Situ

## 2016-03-29 NOTE — Progress Notes (Signed)
CARDIAC REHAB PHASE I   PRE:  Rate/Rhythm: 70 SR  BP:  Sitting: 146/65        SaO2: 99 RA  MODE:  Ambulation: 470 ft   POST:  Rate/Rhythm: 83 SR  BP:  Sitting: 148/61         SaO2: 99 RA  Pt ambulated 470 ft on RA, rolling walker, assist x1, slow, steady gait, tolerated well. Pt c/o mild DOE, weakness in her legs, fatigue with distance, denies cp, declined rest stop. Pt states she has no questions regarding education at this time. Pt to recliner after walk, feet elevated, call bell within reach. Will follow if pt does not discharge today.    [id number] Lenna Sciara, RN, BSN 03/29/2016 10:11 AM

## 2016-03-29 NOTE — Progress Notes (Signed)
Patient will discharge to [facility] Anticipated discharge date:  11/29 Family notified: at bedside Transportation by family- RN to release when ready  CSW signing off.  Jorge Ny, LCSW Clinical Social Worker [phone]

## 2016-03-30 ENCOUNTER — Telehealth: Payer: Self-pay

## 2016-03-30 DIAGNOSIS — G933 Postviral fatigue syndrome: Secondary | ICD-10-CM | POA: Diagnosis not present

## 2016-03-30 DIAGNOSIS — I2510 Atherosclerotic heart disease of native coronary artery without angina pectoris: Secondary | ICD-10-CM | POA: Diagnosis not present

## 2016-03-30 DIAGNOSIS — I4891 Unspecified atrial fibrillation: Secondary | ICD-10-CM | POA: Diagnosis not present

## 2016-03-30 DIAGNOSIS — G4733 Obstructive sleep apnea (adult) (pediatric): Secondary | ICD-10-CM | POA: Diagnosis not present

## 2016-03-30 NOTE — Telephone Encounter (Signed)
Attempted TCM, she had a MI, has scheduled follow up for Cardiology.  Left a message to return a call to me.

## 2016-03-31 ENCOUNTER — Encounter
Admission: RE | Admit: 2016-03-31 | Discharge: 2016-03-31 | Disposition: A | Discharge status: Home / Self Care | Payer: Medicare Other | Source: Ambulatory Visit | Attending: Internal Medicine | Admitting: Internal Medicine

## 2016-04-03 ENCOUNTER — Telehealth: Payer: Self-pay | Admitting: Internal Medicine

## 2016-04-03 NOTE — Telephone Encounter (Signed)
Pt declined to get the AWV. Thank you! °

## 2016-04-04 ENCOUNTER — Encounter: Payer: Self-pay | Admitting: Cardiovascular Disease

## 2016-04-04 ENCOUNTER — Ambulatory Visit (INDEPENDENT_AMBULATORY_CARE_PROVIDER_SITE_OTHER): Payer: Medicare Other | Admitting: Cardiovascular Disease

## 2016-04-04 VITALS — BP 136/64 | HR 59 | Ht 60.0 in | Wt 180.8 lb

## 2016-04-04 DIAGNOSIS — R0602 Shortness of breath: Secondary | ICD-10-CM

## 2016-04-04 DIAGNOSIS — I2119 ST elevation (STEMI) myocardial infarction involving other coronary artery of inferior wall: Secondary | ICD-10-CM

## 2016-04-04 DIAGNOSIS — E669 Obesity, unspecified: Secondary | ICD-10-CM

## 2016-04-04 DIAGNOSIS — I10 Essential (primary) hypertension: Secondary | ICD-10-CM

## 2016-04-04 DIAGNOSIS — I2510 Atherosclerotic heart disease of native coronary artery without angina pectoris: Secondary | ICD-10-CM

## 2016-04-04 DIAGNOSIS — E1159 Type 2 diabetes mellitus with other circulatory complications: Secondary | ICD-10-CM

## 2016-04-04 DIAGNOSIS — I4891 Unspecified atrial fibrillation: Secondary | ICD-10-CM

## 2016-04-04 DIAGNOSIS — Z9861 Coronary angioplasty status: Secondary | ICD-10-CM

## 2016-04-04 DIAGNOSIS — I25110 Atherosclerotic heart disease of native coronary artery with unstable angina pectoris: Secondary | ICD-10-CM | POA: Diagnosis not present

## 2016-04-04 NOTE — Patient Instructions (Addendum)
Medication Instructions:   No medication changes made  Labwork:  No new labs needed  Testing/Procedures:  No further testing at this time  We will order cardiac rehab at [facility] post STEMI They will review your chart and call you with an appt  I recommend watching educational videos on topics of interest to you at:       [email or web address]  Enter code: [email or web address]   Follow-Up: It was a pleasure seeing you in the office today. Please call us if you have new issues that need to be addressed before your next appt.  [phone]  Your physician wants you to follow-up in: 3 months.  You will receive a reminder letter in the mail two months in advance. If you don't receive a letter, please call our office to schedule the follow-up appointment.  If you need a refill on your cardiac medications before your next appointment, please call your pharmacy.     Cardiac Rehabilitation WHAT IS CARDIAC REHABILITATION? Cardiac rehabilitation is a treatment program that helps improve the health and well-being of people who have heart problems. Cardiac rehabilitation includes exercise training, education, and counseling to help you get stronger and return to an active lifestyle. This program can help you get better faster and reduce any future hospital stays. WHY MIGHT I NEED CARDIAC REHABILITATION? Cardiac rehabilitation programs can help when you have or have had:  A heart attack.  Heart failure.  Peripheral artery disease.  Coronary artery disease.  Angina.  Lung or breathing problems. Cardiac rehabilitation programs are also used when you have had:  Coronary artery bypass graft surgery.  Heart valve replacement.  Heart stent placement.  Heart transplant.  Aneurysm repair. WHAT ARE THE BENEFITS OF CARDIAC REHABILITATION? Cardiac rehabilitation can help:  Reduce problems like chest pain and trouble breathing.  Change risk factors that contribute to heart disease, such  as:  Smoking.  High blood pressure.  High cholesterol.  Diabetes.  Being out of shape or not active.  Weighing more than 30% higher than your ideal weight.  Diet.  Improve your mental outlook so you feel:  More hopeful.  Better about yourself.  More confident about taking care of yourself.  Get support from health experts as well as other people with similar problems.  Learn how to manage and understand your medicines.  Teach your family about your condition and how to participate in your recovery. WHAT HAPPENS IN CARDIAC REHABILITATION? You will be assessed by a cardiac rehabilitation team. They will check your health history and do a physical exam. You may need blood tests, stress tests, and other evaluations to make sure that you are ready to start cardiac rehabilitation. The cardiac rehabilitation team works with you to make a plan based on your health and goals. Your program will be tailored to fit you and your needs and may change as you progress. You may work with a health care team that includes:  Doctors.  Nurses.  Dietitians.  Psychologists.  Exercise specialists.  Physical and occupational therapists. WHAT ARE THE PHASES OF CARDIAC REHABILITATION? A cardiac rehabilitation program is often divided into phases. You advance from one phase to the next. Phase One This phase starts while you are still in the hospital. You may start by walking in your room and then in the hall. You may start some simple exercises with a therapist. Phase Two This phase begins when you go home or to another facility. This phase may last 8-12 weeks. You will travel to a  cardiac rehabilitation center or another place where rehabilitation is offered. You will slowly increase your activity level while being closely watched by a nurse or therapist. Exercises may include a combination of strength or resistance training and "cardio" or aerobic movement on a treadmill or other machines.  Your condition will determine how often and how long these sessions last. In phase two, you may learn how to cook healthy meals, control your blood sugar, and manage your medicines. You may need help with scheduling or planning how and when to take your medicines. If you have questions about your medicines, it is very important that you talk to your health care provider. Phase Three This phase continues for the rest of your life. There will be less supervision. You may still participate in cardiac rehabilitation activities or become part of a group in your community. You may benefit from talking about your experience with other people who are facing similar challenges. WHEN SHOULD I SEEK IMMEDIATE MEDICAL CARE? Seek immediate medical care if:  You have severe chest discomfort, especially if the pain is crushing or pressure-like and spreads to your arms, back, neck, or jaw. Do not wait to see if the pain will go away.  You have weakness or numbness in your face, arms, or legs, especially on one side of the body.  Your speech is slurred.  You are confused.  You have a sudden severe headache or loss of vision.  You have shortness of breath.  You are sweating and have nausea.  You feel dizzy or faint.  You are fatigued. These symptoms may represent a serious problem that is an emergency. Do not wait to see if the symptoms will go away. Get medical help right away. Call your local emergency services (911 in the [place] Do not drive yourself to the hospital. This information is not intended to replace advice given to you by your health care provider. Make sure you discuss any questions you have with your health care provider. Document Released: 01/25/2008 Document Revised: 08/26/2015 Document Reviewed: 03/01/2015 Elsevier Interactive Patient Education  2017 [employer].  Exercise Guidelines During Cardiac Rehabilitation Introduction When you are recovering from a cardiac event, such as  heart surgery, a heart attack, or heart failure, it is important to form heart-healthy habits, including exercise habits. Discuss an appropriate exercise program with your cardiologist and rehabilitation therapist. It is important to design a program that is safe and effective for you. The program should meet your specific abilities and needs. Walking, biking, jogging, and swimming are all good aerobic activities. These take light to moderate effort. Adding some light resistance training is also important. Even simple lifestyle changes can help, such as parking farther from the store or taking the stairs instead of the elevator. At first, you may begin exercising under supervision, such as at a hospital or clinic. Over time, you may begin exercising at home, with your health care provider's approval. Types of exercise Aerobic exercise During cardiac rehabilitation, it is important to do more aerobic activities. Aerobic exercise keeps joints and muscles moving. It involves large muscle groups. It is also rhythmic in nature and must be done for a longer period of time. Doing these exercises improves circulation and endurance. Examples of aerobic exercise include:  Swimming.  Walking.  Hiking.  Jogging.  Cross-country skiing.  Biking. Static exercise Static exercise uses muscles at high intensities without moving the joints. An example of static exercise is pushing against a heavy couch that does not move.  Static exercise improves strength but also quickly increases blood pressure. Follow these guidelines:  Do not do static exercises if you have circulation problems or high blood pressure.  Do not hold your breath while doing static exercises. Holding your breath during static exercises can raise your blood pressure to a dangerously high level. Weight-resistance exercise Weight-resistance exercises are another important part of rehabilitation. These exercises strengthen your muscles by making  them work against resistance. Resistance exercises may help you return to activities of daily living sooner and improve your quality of life. They also help reduce cardiac risk factors. Examples of weight-resistance exercise include using:  Free weights.  Weight-lifting machines.  Large, specially designed rubber bands. You will usually do weight-resistance exercises 2 times a week with a 2-day rest period between workouts. Stretching Stretching before you exercise warms up your muscles and prevents injury. Stretching also improves your flexibility, balance, coordination, and range of motion.  Stretch both before and after exercising.  Do not force a muscle or joint into a painful angle. Stretching should be a relaxing part of your exercise routine.  As soon as you feel resistance, hold the position and count to 10.  Go slowly when doing all stretches. Setting a pace  Choose a pace that is comfortable for you. You should be able to talk while exercising. If you are short of breath or unable to speak while you exercise, slow down.  Keep track of how hard you are working as you exercise (exertion level). Your rehabilitation therapist can teach you to use a scale to measure your level of exertion. Use this scale to make sure you are exercising at a level that is safe and effective for you.  Cardiac rehabilitation and wellness facilities use numerical scales to rate your level of exertion. These scales usually rate your exertion level in a range from 6 to 20.  For a healthy exercise session, your exertion rate goal should be between 11 and 15.  A rating of 6 means that you are not exerting yourself at all.  A rating of 20 indicates that you are working very hard. Frequency This information is not intended to replace advice given to you by your health care provider. Make sure you discuss any questions you have with your health care provider. Document Released: 04/22/2013 Document Revised:  09/23/2015 Document Reviewed: 03/19/2013 Elsevier Interactive Patient Education  2017 [employer].

## 2016-04-04 NOTE — Progress Notes (Signed)
Cardiology Office Note  Date:  04/04/2016   ID:  April [name], DOB Oct 03, 1940, MRN [id number]  PCP:  Einar Pheasant, MD   Chief Complaint  Patient presents with  . other    F/u STEMI c/o chest discomfort. Meds reviewed verbally with pt.    HPI:  April [name] a pleasant 75 year old woman with history of coronary artery disease, 40% proximal RCA disease on cardiac catheterization in 01/2010, chronic shortness of breath who presents  For routine follow-up of her shortness of breath several cardiac catheterizations over the years, last in 2011 showing very mild nonobstructive proximal RCA disease.  CT scan showing no coronary calcification seen in the LAD or left circumflex, moderate calcification seen in the proximal RCA ( moderate hiatal hernia) Mild aortic atherosclerosis seen.  recent stress test 08/13/2015 showing no ischemia  STEMI, proximal RCA and pda branch Had chest pressure, sweating "looked white"  Had atrial fibrillation 24 hours in the hospital , now on eliquis 5 BID  At [facility], They are doing meds Doing PT and OT, BP running high with OT  better this Am, Electric shocks of chest pain, had it before the STEMI  Feels she is at 50%  EKG with NSR with St and T wave ABN V4 to V6, II, III, AVF  Total chol 133, LDL 63   Other past medical history Echocardiogram in 2015 showing normal ejection fraction, borderline elevated right ventricular systolic pressures, diastolic relaxation abnormality.   PMH:   has a past medical history of Arrhythmia; CAD S/P PCI pRCA Promus DES 3.5 x 16 (4.1 mm), ostRPDA Promus DES 2.5 x 12 (2.7 mm) (03/25/2016); Diverticulitis; Endometriosis; Heart murmur; Hiatal hernia; History of colon polyps (08/1994); Hypercholesterolemia; Hypertension; Migraines; Osteoporosis; Rheumatic fever; Sleep apnea; ST elevation myocardial infarction (STEMI) of inferolateral wall, initial episode of care [facility]) (03/25/2016); and Urine incontinence.  PSH:     Past Surgical History:  Procedure Laterality Date  . ABDOMINAL HYSTERECTOMY     ovaries not removed  . APPENDECTOMY     was removed during hysterectomy  . Breast biopsies     x2  . BREAST BIOPSY Bilateral "years ago"  . BREAST SURGERY Bilateral   . CARDIAC CATHETERIZATION  2011   moderate 40% RCA disease  . CARDIAC CATHETERIZATION  01/2010   Dr. Ania [email or web address] : Only noted 40% RCA  . CARDIAC CATHETERIZATION N/A 03/25/2016   Procedure: Left Heart Cath and Coronary Angiography;  Surgeon: 04/07/2016, MD;  Location: [place] [facility];  Service: Cardiovascular;  Laterality: N/A;  . CARDIAC CATHETERIZATION N/A 03/25/2016   Procedure: Coronary Stent Intervention;  Surgeon: 04/07/2016, MD;  Location: [place] [facility];  Service: Cardiovascular;  Laterality: N/A;  . COLONOSCOPY  2013  . ESOPHAGOGASTRODUODENOSCOPY (EGD) WITH PROPOFOL N/A 03/17/2015   Procedure: ESOPHAGOGASTRODUODENOSCOPY (EGD) WITH PROPOFOL;  Surgeon: 03/30/2015, MD;  Location: [facility] ENDOSCOPY;  Service: Gastroenterology;  Laterality: N/A;  . NM MYOVIEW ([place] [facility])  07/2015   No evidence ischemia or infarction. EF 66%. Low risk  . TONSILLECTOMY    . TRANSTHORACIC ECHOCARDIOGRAM  10/2013   Normal LV size and function. EF 55-65%. GR 1 DD. Otherwise normal.    Current Outpatient Prescriptions  Medication Sig Dispense Refill  . acetaminophen (TYLENOL) 500 MG tablet Take 500 mg by mouth daily as needed for headache (pain).    11/2013 albuterol (PROVENTIL HFA;VENTOLIN HFA) 108 (90 Base) MCG/ACT inhaler Inhale 2 puffs into the lungs every 4 (four) hours as needed for wheezing  or shortness of breath. 1 Inhaler 6  . amiodarone (PACERONE) 200 MG tablet Take 1 tablet (200 mg total) by mouth daily. 30 tablet 6  . amLODipine (NORVASC) 2.5 MG tablet Take 1 tablet (2.5 mg total) by mouth daily. 30 tablet 6  . apixaban (ELIQUIS) 5 MG TABS tablet Take 1 tablet (5 mg total) by mouth 2 (two) times daily. 60 tablet 6  . calcium  carbonate (TUMS - DOSED IN MG ELEMENTAL CALCIUM) 500 MG chewable tablet Chew 1-3 tablets by mouth daily as needed for indigestion or heartburn.     . cholecalciferol (VITAMIN D) 1000 units tablet Take 1,000 Units by mouth 2 (two) times daily.    . clopidogrel (PLAVIX) 75 MG tablet Take 1 tablet (75 mg total) by mouth daily. 30 tablet 6  . fish oil-omega-3 fatty acids 1000 MG capsule Take 1 g by mouth 2 (two) times daily.     . fluticasone (FLONASE) 50 MCG/ACT nasal spray Place 1-2 sprays into both nostrils at bedtime.     . fluticasone furoate-vilanterol (BREO ELLIPTA) 100-25 MCG/INH AEPB Inhale 1 puff into the lungs daily. (Patient taking differently: Inhale 1 puff into the lungs at bedtime. ) 60 each 5  . furosemide (LASIX) 20 MG tablet Take 1 tablet (20 mg total) by mouth daily. 30 tablet 6  . losartan-hydrochlorothiazide (HYZAAR) 100-12.5 MG tablet TAKE ONE (1) TABLET EACH DAY 30 tablet 3  . meloxicam (MOBIC) 15 MG tablet Take 15 mg by mouth daily as needed for pain.     . Menthol, Topical Analgesic, (BIOFREEZE EX) Apply 1 application topically daily as needed (pain).    . metoprolol (LOPRESSOR) 100 MG tablet Take 1 tablet (100 mg total) by mouth 2 (two) times daily. 60 tablet 6  . Misc Natural Products (OSTEO BI-FLEX JOINT SHIELD PO) Take 1 tablet by mouth 2 (two) times daily.     . Multiple Vitamin (MULTIVITAMIN WITH MINERALS) TABS tablet Take 0.5 tablets by mouth 2 (two) times daily.    . nitroGLYCERIN (NITROSTAT) 0.4 MG SL tablet Place 1 tablet (0.4 mg total) under the tongue every 5 (five) minutes as needed. 25 tablet 3  . pantoprazole (PROTONIX) 40 MG tablet Take 1 tablet (40 mg total) by mouth daily. 30 tablet 6  . Polyethyl Glycol-Propyl Glycol (SYSTANE OP) Place 1 drop into both eyes daily as needed (dry eyes).    . potassium chloride (K-DUR) 10 MEQ tablet Take 1 tablet (10 mEq total) by mouth daily as needed. (Patient taking differently: Take 10 mEq by mouth daily as needed (with  furosemide (lasix) dose). ) 30 tablet 11  . PRESCRIPTION MEDICATION Inhale into the lungs at bedtime. CPAP    . rosuvastatin (CRESTOR) 40 MG tablet Take 1 tablet (40 mg total) by mouth daily. 30 tablet 6  . traMADol (ULTRAM) 50 MG tablet Take 50 mg by mouth daily as needed (pain).     No current facility-administered medications for this visit.      Allergies:   Penicillins and Sulfa antibiotics   Social History:  The patient  reports that she has never smoked. She has never used smokeless tobacco. She reports that she does not drink alcohol or use drugs.   Family History:   family history includes Arthritis in her mother; Heart attack in her father; Heart disease in her father; Hypertension in her mother; Osteoporosis in her mother; Stroke in her brother and mother.    Review of Systems: Review of Systems  Constitutional: Positive for  malaise/fatigue.  Respiratory: Negative.   Cardiovascular: Negative.   Gastrointestinal: Negative.   Musculoskeletal: Negative.   Neurological: Positive for weakness.  Psychiatric/Behavioral: Negative.   All other systems reviewed and are negative.    PHYSICAL EXAM: VS:  BP 136/64 (BP Location: Left Arm, Patient Position: Sitting, Cuff Size: Large)   Pulse (!) 59   Ht 5' (1.524 m)   Wt 180 lb 12 oz (82 kg)   LMP 05/01/1972   BMI 35.30 kg/m  , BMI Body mass index is 35.3 kg/m. GEN: Well nourished, well developed, in no acute distress, obese  HEENT: normal  Neck: no JVD, carotid bruits, or masses Cardiac: RRR; no murmurs, rubs, or gallops,no edema  Respiratory:  clear to auscultation bilaterally, normal work of breathing GI: soft, nontender, nondistended, + BS MS: no deformity or atrophy  Skin: warm and dry, no rash Neuro:  Strength and sensation are intact Psych: euthymic mood, full affect    Recent Labs: 03/26/2016: ALT 26; Hemoglobin 11.2; Platelets 209 03/28/2016: BUN 22; Creatinine, Ser 1.01; Potassium 3.9; Sodium 137    Lipid  Panel Lab Results  Component Value Date   CHOL 133 03/26/2016   HDL 53 03/26/2016   LDLCALC 63 03/26/2016   TRIG 87 03/26/2016      Wt Readings from Last 3 Encounters:  04/04/16 180 lb 12 oz (82 kg)  03/27/16 177 lb 1.6 oz (80.3 kg)  02/14/16 180 lb 6.4 oz (81.8 kg)       ASSESSMENT AND PLAN:  Essential hypertension, benign - Plan: EKG 12-Lead Blood pressure is well controlled on today's visit. No changes made to the medications.  New onset atrial fibrillation ([place]) - Plan: EKG 12-Lead Paroxysmal, seen in the hospital at [facility] On eliqui 5 BID In NSR  Coronary artery disease involving native coronary artery of native heart with unstable angina pectoris ([place]) Recent stent to prox RCA, and PDA branch  ST elevation myocardial infarction (STEMI) of inferolateral wall, initial episode of care [facility]) Tired, recovering, will place order for cardiac rehab  CAD S/P PCI pRCA Promus DES 3.5 x 16 (4.1 mm), ostRPDA Promus DES 2.5 x 12 (2.7 mm)  Obesity (BMI 30-39.9) We have encouraged continued exercise, careful diet management in an effort to lose weight.  SOB (shortness of breath) Recent STEMI. Cardiac rehab  Type 2 diabetes mellitus with other circulatory complication, without long-term current use of insulin (HCC) HBA1C 6.2, doing well   Total encounter time more than 45 minutes  Greater than 50% was spent in counseling and coordination of care with the patient  Disposition:   F/U  3 months   Orders Placed This Encounter  Procedures  . EKG 12-Lead     Signed, Esmond Plants, M.D., Ph.D. 04/04/2016  [place], [place]

## 2016-04-05 ENCOUNTER — Telehealth: Payer: Self-pay | Admitting: *Deleted

## 2016-04-05 NOTE — Telephone Encounter (Signed)
I called patient to follow-up for Dal-Gene Study. Patient decided not to do study at recommendation of her cardiologist. I thanked patient for her willingness to participate in the study. I will with draw patient.

## 2016-04-10 ENCOUNTER — Telehealth: Payer: Self-pay | Admitting: *Deleted

## 2016-04-10 DIAGNOSIS — M6281 Muscle weakness (generalized): Secondary | ICD-10-CM | POA: Diagnosis not present

## 2016-04-10 DIAGNOSIS — R262 Difficulty in walking, not elsewhere classified: Secondary | ICD-10-CM | POA: Diagnosis not present

## 2016-04-10 NOTE — Telephone Encounter (Signed)
Completed TCM on prior note. And scheduled for follow up on Friday, patient confirmed.

## 2016-04-10 NOTE — Telephone Encounter (Signed)
Pt discharged for rehab on 12/08, please advise if pt will need a sooner follow up.  Hospital notes are in the chart  Pt now resides the [place] of [place] Pt contact [phone]

## 2016-04-10 NOTE — Telephone Encounter (Signed)
See below , thanks

## 2016-04-10 NOTE — Telephone Encounter (Signed)
Can go ahead and call and see how pt doing.  Thanks.

## 2016-04-10 NOTE — Telephone Encounter (Signed)
    Transition Care Management Follow-up Telephone Call  How have you been since you were released from the hospital? Was discharged to rehab, pretty good, OT therapy just stopped, PT therapy remains, just weak still   Do you understand why you were in the hospital? Had a heart  Attack and two stents placed   Do you understand the discharge instrcutions? Yes, no questions  Items Reviewed:  Medications reviewed: added Plavix ans eliqus, took her off a few meds also  Allergies reviewed: no new ones  Dietary changes reviewed: yes, no changes, heart healthy diet, change her quantity  Referrals reviewed: followed up with Dr. Rockey Situ, went Tuesday last week.    Functional Questionnaire:   Activities of Daily Living (ADLs):   She states they are independent in the following: independent  States they require assistance with the following: no issues   Any transportation issues/concerns?: no issues  Any patient concerns?no concerns  Moved to the cottage at [place].  Land line [phone] at the cottage.   Confirmed importance and date/time of follow-up visits scheduled: 12/15 at 1130 am with PCP   Confirmed with patient if condition begins to worsen call PCP or go to the ER.  Patient was given the Call-a-Nurse line [phone]: yes verbalized understanding

## 2016-04-10 NOTE — Telephone Encounter (Signed)
She was in the hospital for Cardiac reasons and has already followed up with Dr. Rockey Situ last week.  I was not able to reach her last week to do the TCM.  It will be just a regular follow up.  I can call and schedule if you like.

## 2016-04-10 NOTE — Telephone Encounter (Signed)
Pt has an appt on 05/18/16 with labs 2 days prior. Please advise thanks.

## 2016-04-10 NOTE — Telephone Encounter (Signed)
Isn't this a TCM call?  I can see her Friday at 11:30 - Friday 04/14/16

## 2016-04-11 NOTE — Telephone Encounter (Signed)
1130appt is used already and a 12.  Please advise for that day 12/15?

## 2016-04-12 NOTE — Telephone Encounter (Signed)
Can schedule her at 3:00 on 04/21/16.

## 2016-04-12 NOTE — Telephone Encounter (Signed)
Pt scheduled  

## 2016-04-12 NOTE — Telephone Encounter (Signed)
April [name] can you possibly call this patient to reschedule on this day, I wanted to make sure you didn't have someone slated for that slot yet. thanks

## 2016-04-13 DIAGNOSIS — M6281 Muscle weakness (generalized): Secondary | ICD-10-CM | POA: Diagnosis not present

## 2016-04-13 DIAGNOSIS — R262 Difficulty in walking, not elsewhere classified: Secondary | ICD-10-CM | POA: Diagnosis not present

## 2016-04-18 DIAGNOSIS — R262 Difficulty in walking, not elsewhere classified: Secondary | ICD-10-CM | POA: Diagnosis not present

## 2016-04-18 DIAGNOSIS — M6281 Muscle weakness (generalized): Secondary | ICD-10-CM | POA: Diagnosis not present

## 2016-04-21 ENCOUNTER — Encounter: Payer: Self-pay | Admitting: Internal Medicine

## 2016-04-21 ENCOUNTER — Ambulatory Visit (INDEPENDENT_AMBULATORY_CARE_PROVIDER_SITE_OTHER): Payer: Medicare Other | Admitting: Internal Medicine

## 2016-04-21 ENCOUNTER — Ambulatory Visit: Payer: Medicare Other | Admitting: Internal Medicine

## 2016-04-21 DIAGNOSIS — E7800 Pure hypercholesterolemia, unspecified: Secondary | ICD-10-CM | POA: Diagnosis not present

## 2016-04-21 DIAGNOSIS — I10 Essential (primary) hypertension: Secondary | ICD-10-CM

## 2016-04-21 DIAGNOSIS — I209 Angina pectoris, unspecified: Secondary | ICD-10-CM

## 2016-04-21 DIAGNOSIS — I25110 Atherosclerotic heart disease of native coronary artery with unstable angina pectoris: Secondary | ICD-10-CM

## 2016-04-21 DIAGNOSIS — F439 Reaction to severe stress, unspecified: Secondary | ICD-10-CM | POA: Diagnosis not present

## 2016-04-21 DIAGNOSIS — G4733 Obstructive sleep apnea (adult) (pediatric): Secondary | ICD-10-CM

## 2016-04-21 DIAGNOSIS — K219 Gastro-esophageal reflux disease without esophagitis: Secondary | ICD-10-CM

## 2016-04-21 DIAGNOSIS — E669 Obesity, unspecified: Secondary | ICD-10-CM | POA: Diagnosis not present

## 2016-04-21 DIAGNOSIS — E1159 Type 2 diabetes mellitus with other circulatory complications: Secondary | ICD-10-CM

## 2016-04-21 NOTE — Progress Notes (Signed)
Pre visit review using our clinic review tool, if applicable. No additional management support is needed unless otherwise documented below in the visit note. 

## 2016-04-21 NOTE — Patient Instructions (Signed)
Zantac (ranitidine) 150mg  - take one tablet 30 minutes before your evening meal

## 2016-04-21 NOTE — Progress Notes (Signed)
Patient ID: April [name], female   DOB: Nov 11, 1940, 75 y.o.   MRN: [id number]   Subjective:    Patient ID: April [name], female    DOB: 02-01-1941, 75 y.o.   MRN: [id number]  HPI  Patient here for hospital follow up.  She was admitted 03/25/16 with STEMI of inferolateral wall and new onset of afib.  On eliquis.  Discharged to rehab.  Had OT and PT.  Saw Dr Rockey Situ 04/04/16.  Recommended exercise and diet.  Felt stable.  Her main complaint is that of being fatigued.  Planning to start cardiac rehab 04/27/16.  Overall does feel better.  Eating.  No nausea or vomiting.  No abdominal pain or cramping.  Bowels stable.  Some acid reflux.  On protonix.     Past Medical History:  Diagnosis Date  . Arrhythmia    history of an irregular heart beat  . CAD S/P PCI pRCA Promus DES 3.5 x 16 (4.1 mm), ostRPDA Promus DES 2.5 x 12 (2.7 mm) 03/25/2016  . Diverticulitis   . Endometriosis   . Heart murmur    no significant valvular lesion noted on Echo  . Hiatal hernia   . History of colon polyps 08/1994  . Hypercholesterolemia   . Hypertension   . Migraines    history of migraines  . Osteoporosis   . Rheumatic fever   . Sleep apnea    CPAP  . ST elevation myocardial infarction (STEMI) of inferolateral wall, initial episode of care ([place]) 03/25/2016  . Urine incontinence    Past Surgical History:  Procedure Laterality Date  . ABDOMINAL HYSTERECTOMY     ovaries not removed  . APPENDECTOMY     was removed during hysterectomy  . Breast biopsies     x2  . BREAST BIOPSY Bilateral "years ago"  . BREAST SURGERY Bilateral   . CARDIAC CATHETERIZATION  2011   moderate 40% RCA disease  . CARDIAC CATHETERIZATION  01/2010   Dr. Gollan@[facility] : Only noted 40% RCA  . CARDIAC CATHETERIZATION N/A 03/25/2016   Procedure: Left Heart Cath and Coronary Angiography;  Surgeon: 04/07/2016, MD;  Location: [facility];  Service: Cardiovascular;  Laterality: N/A;  . CARDIAC CATHETERIZATION N/A 03/25/2016     Procedure: Coronary Stent Intervention;  Surgeon: 04/07/2016, MD;  Location: [facility];  Service: Cardiovascular;  Laterality: N/A;  . COLONOSCOPY  2013  . ESOPHAGOGASTRODUODENOSCOPY (EGD) WITH PROPOFOL N/A 03/17/2015   Procedure: ESOPHAGOGASTRODUODENOSCOPY (EGD) WITH PROPOFOL;  Surgeon: 03/30/2015, MD;  Location: [facility];  Service: Gastroenterology;  Laterality: N/A;  . NM MYOVIEW ([place] HX)  07/2015   No evidence ischemia or infarction. EF 66%. Low risk  . TONSILLECTOMY    . TRANSTHORACIC ECHOCARDIOGRAM  10/2013   Normal LV size and function. EF 55-65%. GR 1 DD. Otherwise normal.   Family History  Problem Relation Age of Onset  . Arthritis Mother   . Stroke Mother   . Hypertension Mother   . Osteoporosis Mother   . Heart disease Father     MI  . Heart attack Father   . Stroke Brother   . Breast cancer      first cousin x 2  . Colon cancer Neg Hx    Social History   Social History  . Marital status: Widowed    Spouse name: N/A  . Number of children: 2  . Years of education: N/A   Social History Main Topics  . Smoking  status: Never Smoker  . Smokeless tobacco: Never Used  . Alcohol use No  . Drug use: No  . Sexual activity: Not Asked   Other Topics Concern  . None   Social History Narrative  . None    Outpatient Encounter Prescriptions as of 04/21/2016  Medication Sig  . acetaminophen (TYLENOL) 500 MG tablet Take 500 mg by mouth daily as needed for headache (pain).  Marland Kitchen albuterol (PROVENTIL HFA;VENTOLIN HFA) 108 (90 Base) MCG/ACT inhaler Inhale 2 puffs into the lungs every 4 (four) hours as needed for wheezing or shortness of breath. (Patient not taking: Reported on 04/27/2016)  . amiodarone (PACERONE) 200 MG tablet Take 1 tablet (200 mg total) by mouth daily.  Marland Kitchen amLODipine (NORVASC) 2.5 MG tablet Take 1 tablet (2.5 mg total) by mouth daily.  Marland Kitchen apixaban (ELIQUIS) 5 MG TABS tablet Take 1 tablet (5 mg total) by mouth 2 (two) times  daily.  . calcium carbonate (TUMS - DOSED IN MG ELEMENTAL CALCIUM) 500 MG chewable tablet Chew 1-3 tablets by mouth daily as needed for indigestion or heartburn.   . cholecalciferol (VITAMIN D) 1000 units tablet Take 1,000 Units by mouth 2 (two) times daily.  . clopidogrel (PLAVIX) 75 MG tablet Take 1 tablet (75 mg total) by mouth daily.  . fish oil-omega-3 fatty acids 1000 MG capsule Take 1 g by mouth 2 (two) times daily.   . fluticasone (FLONASE) 50 MCG/ACT nasal spray Place 1-2 sprays into both nostrils at bedtime.   . fluticasone furoate-vilanterol (BREO ELLIPTA) 100-25 MCG/INH AEPB Inhale 1 puff into the lungs daily. (Patient taking differently: Inhale 1 puff into the lungs at bedtime. )  . furosemide (LASIX) 20 MG tablet Take 1 tablet (20 mg total) by mouth daily.  Marland Kitchen losartan-hydrochlorothiazide (HYZAAR) 100-12.5 MG tablet TAKE ONE (1) TABLET EACH DAY  . meloxicam (MOBIC) 15 MG tablet Take 15 mg by mouth daily as needed for pain.   . Menthol, Topical Analgesic, (BIOFREEZE EX) Apply 1 application topically daily as needed (pain).  . metoprolol (LOPRESSOR) 100 MG tablet Take 1 tablet (100 mg total) by mouth 2 (two) times daily.  . Misc Natural Products (OSTEO BI-FLEX JOINT SHIELD PO) Take 1 tablet by mouth 2 (two) times daily.   . Multiple Vitamin (MULTIVITAMIN WITH MINERALS) TABS tablet Take 0.5 tablets by mouth 2 (two) times daily.  . nitroGLYCERIN (NITROSTAT) 0.4 MG SL tablet Place 1 tablet (0.4 mg total) under the tongue every 5 (five) minutes as needed.  . pantoprazole (PROTONIX) 40 MG tablet Take 1 tablet (40 mg total) by mouth daily.  Vladimir Faster Glycol-Propyl Glycol (SYSTANE OP) Place 1 drop into both eyes daily as needed (dry eyes).  . potassium chloride (K-DUR) 10 MEQ tablet Take 1 tablet (10 mEq total) by mouth daily as needed. (Patient taking differently: Take 10 mEq by mouth daily as needed (with furosemide (lasix) dose). )  . PRESCRIPTION MEDICATION Inhale into the lungs at  bedtime. CPAP  . rosuvastatin (CRESTOR) 40 MG tablet Take 1 tablet (40 mg total) by mouth daily.  . traMADol (ULTRAM) 50 MG tablet Take 50 mg by mouth daily as needed (pain).   No facility-administered encounter medications on file as of 04/21/2016.     Review of Systems  Constitutional: Negative for appetite change and unexpected weight change.  HENT: Negative for congestion and sinus pressure.   Respiratory: Negative for cough and chest tightness.   Cardiovascular: Negative for chest pain and palpitations.  Gastrointestinal: Negative for abdominal pain, diarrhea,  nausea and vomiting.  Genitourinary: Negative for difficulty urinating and dysuria.  Musculoskeletal: Negative for joint swelling and myalgias.  Skin: Negative for color change and rash.  Neurological: Negative for dizziness, light-headedness and headaches.  Psychiatric/Behavioral: Negative for agitation and dysphoric mood.       Increased stress with her family issues.  Still dealing with her daughter's death.         Objective:     Blood pressure rechecked by me:  124/68  Physical Exam  Constitutional: She appears well-developed and well-nourished. No distress.  HENT:  Nose: Nose normal.  Mouth/Throat: Oropharynx is clear and moist.  Neck: Neck supple. No thyromegaly present.  Cardiovascular: Normal rate and regular rhythm.   Pulmonary/Chest: Breath sounds normal. No respiratory distress. She has no wheezes.  Abdominal: Soft. Bowel sounds are normal. There is no tenderness.  Musculoskeletal: She exhibits no tenderness.  No increased edema present.   Lymphadenopathy:    She has no cervical adenopathy.  Skin: No rash noted. No erythema.  Psychiatric: She has a normal mood and affect. Her behavior is normal.    BP 122/64   Pulse (!) 56   Temp 98.2 F (36.8 C) (Oral)   Ht 5' (1.524 m)   Wt 180 lb (81.6 kg)   LMP 05/01/1972   SpO2 96%   BMI 35.15 kg/m  Wt Readings from Last 3 Encounters:  04/27/16 176 lb  9.6 oz (80.1 kg)  04/21/16 180 lb (81.6 kg)  04/04/16 180 lb 12 oz (82 kg)     Lab Results  Component Value Date   WBC 9.8 03/26/2016   HGB 11.2 (L) 03/26/2016   HCT 33.7 (L) 03/26/2016   PLT 209 03/26/2016   GLUCOSE 119 (H) 03/28/2016   CHOL 133 03/26/2016   TRIG 87 03/26/2016   HDL 53 03/26/2016   LDLCALC 63 03/26/2016   ALT 26 03/26/2016   AST 76 (H) 03/26/2016   NA 137 03/28/2016   K 3.9 03/28/2016   CL 105 03/28/2016   CREATININE 1.01 (H) 03/28/2016   BUN 22 (H) 03/28/2016   CO2 25 03/28/2016   TSH 1.53 02/05/2015   INR 0.99 03/25/2016   HGBA1C 6.2 (H) 03/26/2016   MICROALBUR 4.2 (H) 06/17/2015       Assessment & Plan:   Problem List Items Addressed This Visit    Coronary artery disease involving native coronary artery of native heart with unstable angina pectoris ([place])    Has known disease.  Seeing cardiology.  Currently stable.  Follow.  Plans for cardiac rehab 04/27/16.        Diabetes mellitus ([place])    Low carb diet and exercise.  Follow met b and a1c.  a1c just checked 6.2.       Essential hypertension, benign (Chronic)    Blood pressure under good control.  Continue same medication regimen.  Follow pressures.  Follow metabolic panel.        GERD (gastroesophageal reflux disease)    Acid reflux as outlined.  Taking protonix.  Add zantac.  Follow.  Notify me if persistent.        Hypercholesterolemia (Chronic)    On crestor.  LDL just checked 63.  Low cholesterol diet and exercise.  Follow lipid panel and liver function tests.        Obesity (BMI 30-39.9) (Chronic)    Diet and exercise.  Follow.       Obstructive sleep apnea (Chronic)    CPAP.  Stress    Increased stress as outlined.  Discussed with her today.  Has good support.  Does not feel needs anything more at this time.  Follow.            Einar Pheasant, MD

## 2016-04-27 ENCOUNTER — Encounter: Payer: Self-pay | Admitting: *Deleted

## 2016-04-27 ENCOUNTER — Encounter: Payer: Medicare Other | Attending: Cardiovascular Disease | Admitting: *Deleted

## 2016-04-27 VITALS — Ht 60.9 in | Wt 176.6 lb

## 2016-04-27 DIAGNOSIS — I2111 ST elevation (STEMI) myocardial infarction involving right coronary artery: Secondary | ICD-10-CM

## 2016-04-27 DIAGNOSIS — Z955 Presence of coronary angioplasty implant and graft: Secondary | ICD-10-CM

## 2016-04-27 NOTE — Patient Instructions (Signed)
Patient Instructions  Patient Details  Name: KRYSTALYNN RIDGEWAY MRN: [id number] Date of Birth: [birth year] Referring Provider:  Minna Merritts, MD  Below are the personal goals you chose as well as exercise and nutrition goals. Our goal is to help you keep on track towards obtaining and maintaining your goals. We will be discussing your progress on these goals with you throughout the program.  Initial Exercise Prescription:     Initial Exercise Prescription - 04/27/16 1500      Date of Initial Exercise RX and Referring Provider   Date 04/27/16   Referring Provider Ida Rogue MD     NuStep   Level 1   Watts --  60-80 spm   Minutes 15   METs 2     REL-XR   Level 1   Watts --  speed 50   Minutes 15   METs 2     Track   Laps 20   Minutes 15   METs 1.92     Prescription Details   Frequency (times per week) 3   Duration Progress to 45 minutes of aerobic exercise without signs/symptoms of physical distress     Intensity   THRR 40-80% of Max Heartrate 92-126   Ratings of Perceived Exertion 11-15     Progression   Progression Continue to progress workloads to maintain intensity without signs/symptoms of physical distress.     Resistance Training   Training Prescription Yes   Weight 2 lbs   Reps 10-12      Exercise Goals: Frequency: Be able to perform aerobic exercise three times per week working toward 3-5 days per week.  Intensity: Work with a perceived exertion of 11 (fairly light) - 15 (hard) as tolerated. Follow your new exercise prescription and watch for changes in prescription as you progress with the program. Changes will be reviewed with you when they are made.  Duration: You should be able to do 30 minutes of continuous aerobic exercise in addition to a 5 minute warm-up and a 5 minute cool-down routine.  Nutrition Goals: Your personal nutrition goals will be established when you do your nutrition analysis with the dietician.  The following are  nutrition guidelines to follow: Cholesterol < 200mg /day Sodium < 1500mg /day Fiber: Women over 50 yrs - 21 grams per day  Personal Goals:     Personal Goals and Risk Factors at Admission - 04/27/16 1530      Core Components/Risk Factors/Patient Goals on Admission    Weight Management Yes;Obesity;Weight Loss   Intervention Weight Management: Develop a combined nutrition and exercise program designed to reach desired caloric intake, while maintaining appropriate intake of nutrient and fiber, sodium and fats, and appropriate energy expenditure required for the weight goal.;Weight Management: Provide education and appropriate resources to help participant work on and attain dietary goals.;Weight Management/Obesity: Establish reasonable short term and long term weight goals.;Obesity: Provide education and appropriate resources to help participant work on and attain dietary goals.   Admit Weight 176 lb 9.6 oz (80.1 kg)   Goal Weight: Short Term 174 lb (78.9 kg)   Goal Weight: Long Term 140 lb (63.5 kg)   Expected Outcomes Short Term: Continue to assess and modify interventions until short term weight is achieved;Long Term: Adherence to nutrition and physical activity/exercise program aimed toward attainment of established weight goal;Weight Loss: Understanding of general recommendations for a balanced deficit meal plan, which promotes 1-2 lb weight loss per week and includes a negative energy balance of [phone] kcal/d;Understanding recommendations  for meals to include 15-35% energy as protein, 25-35% energy from fat, 35-60% energy from carbohydrates, less than 200mg  of dietary cholesterol, 20-35 gm of total fiber daily;Understanding of distribution of calorie intake throughout the day with the consumption of 4-5 meals/snacks   Sedentary Yes   Intervention Provide advice, education, support and counseling about physical activity/exercise needs.;Develop an individualized exercise prescription for aerobic  and resistive training based on initial evaluation findings, risk stratification, comorbidities and participant's personal goals.   Expected Outcomes Achievement of increased cardiorespiratory fitness and enhanced flexibility, muscular endurance and strength shown through measurements of functional capacity and personal statement of participant.   Increase Strength and Stamina Yes   Intervention Provide advice, education, support and counseling about physical activity/exercise needs.;Develop an individualized exercise prescription for aerobic and resistive training based on initial evaluation findings, risk stratification, comorbidities and participant's personal goals.   Expected Outcomes Achievement of increased cardiorespiratory fitness and enhanced flexibility, muscular endurance and strength shown through measurements of functional capacity and personal statement of participant.   Improve shortness of breath with ADL's Yes   Intervention Provide education, individualized exercise plan and daily activity instruction to help decrease symptoms of SOB with activities of daily living.   Expected Outcomes Short Term: Achieves a reduction of symptoms when performing activities of daily living.   Hypertension Yes   Intervention Provide education on lifestyle modifcations including regular physical activity/exercise, weight management, moderate sodium restriction and increased consumption of fresh fruit, vegetables, and low fat dairy, alcohol moderation, and smoking cessation.;Monitor prescription use compliance.   Expected Outcomes Short Term: Continued assessment and intervention until BP is < 140/25mm HG in hypertensive participants. < 130/20mm HG in hypertensive participants with diabetes, heart failure or chronic kidney disease.;Long Term: Maintenance of blood pressure at goal levels.      Tobacco Use Initial Evaluation: History  Smoking Status  . Never Smoker  Smokeless Tobacco  . Never Used     Copy of goals given to participant.

## 2016-04-27 NOTE — Progress Notes (Signed)
Cardiac Individual Treatment Plan  Patient Details  Name: April [name] MRN: [id number] Date of Birth: 06-May-1940 Referring Provider:   Flowsheet Row Cardiac Rehab from 04/27/2016 in [facility] Cardiac and Pulmonary Rehab  Referring Provider  Ida Rogue MD      Initial Encounter Date:  Flowsheet Row Cardiac Rehab from 04/27/2016 in [facility] Cardiac and Pulmonary Rehab  Date  04/27/16  Referring Provider  Ida Rogue MD      Visit Diagnosis: ST elevation myocardial infarction involving right coronary artery [facility])  Status post coronary artery stent placement  Patient's Home Medications on Admission:  Current Outpatient Prescriptions:  .  acetaminophen (TYLENOL) 500 MG tablet, Take 500 mg by mouth daily as needed for headache (pain)., Disp: , Rfl:  .  amiodarone (PACERONE) 200 MG tablet, Take 1 tablet (200 mg total) by mouth daily., Disp: 30 tablet, Rfl: 6 .  amLODipine (NORVASC) 2.5 MG tablet, Take 1 tablet (2.5 mg total) by mouth daily., Disp: 30 tablet, Rfl: 6 .  apixaban (ELIQUIS) 5 MG TABS tablet, Take 1 tablet (5 mg total) by mouth 2 (two) times daily., Disp: 60 tablet, Rfl: 6 .  calcium carbonate (TUMS - DOSED IN MG ELEMENTAL CALCIUM) 500 MG chewable tablet, Chew 1-3 tablets by mouth daily as needed for indigestion or heartburn. , Disp: , Rfl:  .  cholecalciferol (VITAMIN D) 1000 units tablet, Take 1,000 Units by mouth 2 (two) times daily., Disp: , Rfl:  .  clopidogrel (PLAVIX) 75 MG tablet, Take 1 tablet (75 mg total) by mouth daily., Disp: 30 tablet, Rfl: 6 .  fish oil-omega-3 fatty acids 1000 MG capsule, Take 1 g by mouth 2 (two) times daily. , Disp: , Rfl:  .  fluticasone (FLONASE) 50 MCG/ACT nasal spray, Place 1-2 sprays into both nostrils at bedtime. , Disp: , Rfl:  .  fluticasone furoate-vilanterol (BREO ELLIPTA) 100-25 MCG/INH AEPB, Inhale 1 puff into the lungs daily. (Patient taking differently: Inhale 1 puff into the lungs at bedtime. ), Disp: 60 each, Rfl: 5 .   furosemide (LASIX) 20 MG tablet, Take 1 tablet (20 mg total) by mouth daily., Disp: 30 tablet, Rfl: 6 .  losartan-hydrochlorothiazide (HYZAAR) 100-12.5 MG tablet, TAKE ONE (1) TABLET EACH DAY, Disp: 30 tablet, Rfl: 3 .  meloxicam (MOBIC) 15 MG tablet, Take 15 mg by mouth daily as needed for pain. , Disp: , Rfl:  .  Menthol, Topical Analgesic, (BIOFREEZE EX), Apply 1 application topically daily as needed (pain)., Disp: , Rfl:  .  metoprolol (LOPRESSOR) 100 MG tablet, Take 1 tablet (100 mg total) by mouth 2 (two) times daily., Disp: 60 tablet, Rfl: 6 .  Misc Natural Products (OSTEO BI-FLEX JOINT SHIELD PO), Take 1 tablet by mouth 2 (two) times daily. , Disp: , Rfl:  .  Multiple Vitamin (MULTIVITAMIN WITH MINERALS) TABS tablet, Take 0.5 tablets by mouth 2 (two) times daily., Disp: , Rfl:  .  nitroGLYCERIN (NITROSTAT) 0.4 MG SL tablet, Place 1 tablet (0.4 mg total) under the tongue every 5 (five) minutes as needed., Disp: 25 tablet, Rfl: 3 .  pantoprazole (PROTONIX) 40 MG tablet, Take 1 tablet (40 mg total) by mouth daily., Disp: 30 tablet, Rfl: 6 .  Polyethyl Glycol-Propyl Glycol (SYSTANE OP), Place 1 drop into both eyes daily as needed (dry eyes)., Disp: , Rfl:  .  potassium chloride (K-DUR) 10 MEQ tablet, Take 1 tablet (10 mEq total) by mouth daily as needed. (Patient taking differently: Take 10 mEq by mouth daily as needed (with  furosemide (lasix) dose). ), Disp: 30 tablet, Rfl: 11 .  PRESCRIPTION MEDICATION, Inhale into the lungs at bedtime. CPAP, Disp: , Rfl:  .  rosuvastatin (CRESTOR) 40 MG tablet, Take 1 tablet (40 mg total) by mouth daily., Disp: 30 tablet, Rfl: 6 .  traMADol (ULTRAM) 50 MG tablet, Take 50 mg by mouth daily as needed (pain)., Disp: , Rfl:  .  albuterol (PROVENTIL HFA;VENTOLIN HFA) 108 (90 Base) MCG/ACT inhaler, Inhale 2 puffs into the lungs every 4 (four) hours as needed for wheezing or shortness of breath. (Patient not taking: Reported on 04/27/2016), Disp: 1 Inhaler, Rfl:  6  Past Medical History: Past Medical History:  Diagnosis Date  . Arrhythmia    history of an irregular heart beat  . CAD S/P PCI pRCA Promus DES 3.5 x 16 (4.1 mm), ostRPDA Promus DES 2.5 x 12 (2.7 mm) 03/25/2016  . Diverticulitis   . Endometriosis   . Heart murmur    no significant valvular lesion noted on Echo  . Hiatal hernia   . History of colon polyps 08/1994  . Hypercholesterolemia   . Hypertension   . Migraines    history of migraines  . Osteoporosis   . Rheumatic fever   . Sleep apnea    CPAP  . ST elevation myocardial infarction (STEMI) of inferolateral wall, initial episode of care ([place]) 03/25/2016  . Urine incontinence     Tobacco Use: History  Smoking Status  . Never Smoker  Smokeless Tobacco  . Never Used    Labs: Recent Review Flowsheet Data    Labs for ITP Cardiac and Pulmonary Rehab Latest Ref Rng & Units 06/17/2015 10/01/2015 12/23/2015 03/25/2016 03/26/2016   Cholestrol 0 - 200 mg/dL 195 180 142 166 133   LDLCALC 0 - 99 mg/dL 114(H) 101(H) 66 75 63   HDL >40 mg/dL 65.40 63.80 61.00 72 53   Trlycerides <150 mg/dL 79.0 77.0 78.0 96 87   Hemoglobin A1c 4.8 - 5.6 % 6.5 6.1 6.2 - 6.2(H)   TCO2 0 - 100 mmol/L - - - 25 -       Exercise Target Goals: Date: 04/27/16  Exercise Program Goal: Individual exercise prescription set with THRR, safety & activity barriers. Participant demonstrates ability to understand and report RPE using BORG scale, to self-measure pulse accurately, and to acknowledge the importance of the exercise prescription.  Exercise Prescription Goal: Starting with aerobic activity 30 plus minutes a day, 3 days per week for initial exercise prescription. Provide home exercise prescription and guidelines that participant acknowledges understanding prior to discharge.  Activity Barriers & Risk Stratification:     Activity Barriers & Cardiac Risk Stratification - 04/27/16 1514      Activity Barriers & Cardiac Risk Stratification    Activity Barriers Arthritis;Back Problems;Joint Problems;Muscular Weakness;Shortness of Breath;Deconditioning;Balance Concerns;History of Falls  r knee pops, uses knee brace; limited ROM overhead   Cardiac Risk Stratification High      6 Minute Walk:     6 Minute Walk    Row Name 04/27/16 1513         6 Minute Walk   Phase Initial     Distance 1255 feet     Walk Time 6 minutes     # of Rest Breaks 0     MPH 2.37     METS 2.76     RPE 13     Perceived Dyspnea  4     VO2 Peak 6.62     Symptoms Yes (comment)  Comments SOB, knee pain 3/10     Resting HR 58 bpm     Resting BP 126/74     Max Ex. HR 88 bpm     Max Ex. BP 128/74     2 Minute Post BP 122/66        Initial Exercise Prescription:     Initial Exercise Prescription - 04/27/16 1500      Date of Initial Exercise RX and Referring Provider   Date 04/27/16   Referring Provider Ida Rogue MD     NuStep   Level 1   Watts --  60-80 spm   Minutes 15   METs 2     REL-XR   Level 1   Watts --  speed 50   Minutes 15   METs 2     Track   Laps 20   Minutes 15   METs 1.92     Prescription Details   Frequency (times per week) 3   Duration Progress to 45 minutes of aerobic exercise without signs/symptoms of physical distress     Intensity   THRR 40-80% of Max Heartrate 92-126   Ratings of Perceived Exertion 11-15     Progression   Progression Continue to progress workloads to maintain intensity without signs/symptoms of physical distress.     Resistance Training   Training Prescription Yes   Weight 2 lbs   Reps 10-12      Perform Capillary Blood Glucose checks as needed.  Exercise Prescription Changes:     Exercise Prescription Changes    Row Name 04/27/16 1500             Exercise Review   Progression -  Walk Test Results         Response to Exercise   Blood Pressure (Admit) 126/74       Blood Pressure (Exercise) 128/74       Blood Pressure (Exit) 122/66       Heart Rate  (Admit) 558 bpm       Heart Rate (Exercise) 88 bpm       Heart Rate (Exit) 57 bpm       Oxygen Saturation (Admit) 100 %       Oxygen Saturation (Exercise) 100 %       Rating of Perceived Exertion (Exercise) 13       Perceived Dyspnea (Exercise) 4       Symptoms SOB, knee pain 3/10          Exercise Comments:     Exercise Comments    Row Name 04/27/16 1530           Exercise Comments Nanako wants to build her endurance to be able to make the bed (short) and walk without SOB.          Discharge Exercise Prescription (Final Exercise Prescription Changes):     Exercise Prescription Changes - 04/27/16 1500      Exercise Review   Progression --  Walk Test Results     Response to Exercise   Blood Pressure (Admit) 126/74   Blood Pressure (Exercise) 128/74   Blood Pressure (Exit) 122/66   Heart Rate (Admit) 558 bpm   Heart Rate (Exercise) 88 bpm   Heart Rate (Exit) 57 bpm   Oxygen Saturation (Admit) 100 %   Oxygen Saturation (Exercise) 100 %   Rating of Perceived Exertion (Exercise) 13   Perceived Dyspnea (Exercise) 4   Symptoms SOB, knee pain 3/10  Nutrition:  Target Goals: Understanding of nutrition guidelines, daily intake of sodium 1500mg , cholesterol 200mg , calories 30% from fat and 7% or less from saturated fats, daily to have 5 or more servings of fruits and vegetables.  Biometrics:     Pre Biometrics - 04/27/16 1532      Pre Biometrics   Height 5' 0.9" (1.547 m)   Weight 176 lb 9.6 oz (80.1 kg)   Waist Circumference 36 inches   Hip Circumference 46 inches   Waist to Hip Ratio 0.78 %   BMI (Calculated) 33.5   Single Leg Stand 1.73 seconds       Nutrition Therapy Plan and Nutrition Goals:     Nutrition Therapy & Goals - 04/27/16 1445      Intervention Plan   Intervention Prescribe, educate and counsel regarding individualized specific dietary modifications aiming towards targeted core components such as weight, hypertension, lipid  management, diabetes, heart failure and other comorbidities.   Expected Outcomes Short Term Goal: Understand basic principles of dietary content, such as calories, fat, sodium, cholesterol and nutrients.;Short Term Goal: A plan has been developed with personal nutrition goals set during dietitian appointment.;Long Term Goal: Adherence to prescribed nutrition plan.      Nutrition Discharge: Rate Your Plate Scores:     Nutrition Assessments - 04/27/16 1447      Rate Your Plate Scores   Pre Score 64   Pre Score % 73 %      Nutrition Goals Re-Evaluation:   Psychosocial: Target Goals: Acknowledge presence or absence of depression, maximize coping skills, provide positive support system. Participant is able to verbalize types and ability to use techniques and skills needed for reducing stress and depression.  Initial Review & Psychosocial Screening:     Initial Psych Review & Screening - 04/27/16 1504      Initial Review   Current issues with --  pt voiced no concerns     Family Dynamics   Good Support System? Yes   Comments No psychosocial concerns were identified during medical review with Ms. Pevey     Barriers   Psychosocial barriers to participate in program There are no identifiable barriers or psychosocial needs.     Screening Interventions   Interventions Encouraged to exercise      Quality of Life Scores:     Quality of Life - 04/27/16 1538      Quality of Life Scores   Health/Function Pre 24.46 %   Socioeconomic Pre 30 %   Psych/Spiritual Pre 28.29 %   Family Pre 30 %   GLOBAL Pre 26.89 %      PHQ-9: Recent Review Flowsheet Data    Depression screen [facility] 2/9 04/27/2016 02/14/2016 05/14/2015 09/07/2014 02/11/2013   Decreased Interest 0 0 0 0 0   Down, Depressed, Hopeless 0 0 0 0 0   PHQ - 2 Score 0 0 0 0 0   Altered sleeping 0 - - - -   Tired, decreased energy 3 - - - -   Change in appetite 0 - - - -   Feeling bad or failure about yourself  0 - - - -    Trouble concentrating 0 - - - -   Moving slowly or fidgety/restless 0 - - - -   Suicidal thoughts 0 - - - -   PHQ-9 Score 3 - - - -   Difficult doing work/chores Somewhat difficult - - - -      Psychosocial Evaluation and Intervention:  Psychosocial Re-Evaluation:   Vocational Rehabilitation: Provide vocational rehab assistance to qualifying candidates.   Vocational Rehab Evaluation & Intervention:     Vocational Rehab - 04/27/16 1519      Initial Vocational Rehab Evaluation & Intervention   Assessment shows need for Vocational Rehabilitation No      Education: Education Goals: Education classes will be provided on a weekly basis, covering required topics. Participant will state understanding/return demonstration of topics presented.  Learning Barriers/Preferences:     Learning Barriers/Preferences - 04/27/16 1518      Learning Barriers/Preferences   Learning Barriers None   Learning Preferences None      Education Topics: General Nutrition Guidelines/Fats and Fiber: -Group instruction provided by verbal, written material, models and posters to present the general guidelines for heart healthy nutrition. Gives an explanation and review of dietary fats and fiber.   Controlling Sodium/Reading Food Labels: -Group verbal and written material supporting the discussion of sodium use in heart healthy nutrition. Review and explanation with models, verbal and written materials for utilization of the food label.   Exercise Physiology & Risk Factors: - Group verbal and written instruction with models to review the exercise physiology of the cardiovascular system and associated critical values. Details cardiovascular disease risk factors and the goals associated with each risk factor.   Aerobic Exercise & Resistance Training: - Gives group verbal and written discussion on the health impact of inactivity. On the components of aerobic and resistive training programs and  the benefits of this training and how to safely progress through these programs. Flowsheet Row Cardiac Rehab from 04/27/2016 in [facility] Cardiac and Pulmonary Rehab  Date  04/27/16  Educator  SB  Instruction Review Code  2- meets goals/outcomes      Flexibility, Balance, General Exercise Guidelines: - Provides group verbal and written instruction on the benefits of flexibility and balance training programs. Provides general exercise guidelines with specific guidelines to those with heart or lung disease. Demonstration and skill practice provided.   Stress Management: - Provides group verbal and written instruction about the health risks of elevated stress, cause of high stress, and healthy ways to reduce stress.   Depression: - Provides group verbal and written instruction on the correlation between heart/lung disease and depressed mood, treatment options, and the stigmas associated with seeking treatment.   Anatomy & Physiology of the Heart: - Group verbal and written instruction and models provide basic cardiac anatomy and physiology, with the coronary electrical and arterial systems. Review of: AMI, Angina, Valve disease, Heart Failure, Cardiac Arrhythmia, Pacemakers, and the ICD.   Cardiac Procedures: - Group verbal and written instruction and models to describe the testing methods done to diagnose heart disease. Reviews the outcomes of the test results. Describes the treatment choices: Medical Management, Angioplasty, or Coronary Bypass Surgery.   Cardiac Medications: - Group verbal and written instruction to review commonly prescribed medications for heart disease. Reviews the medication, class of the drug, and side effects. Includes the steps to properly store meds and maintain the prescription regimen.   Go Sex-Intimacy & Heart Disease, Get SMART - Goal Setting: - Group verbal and written instruction through game format to discuss heart disease and the return to sexual intimacy.  Provides group verbal and written material to discuss and apply goal setting through the application of the S.M.A.R.T. Method.   Other Matters of the Heart: - Provides group verbal, written materials and models to describe Heart Failure, Angina, Valve Disease, and Diabetes in the realm of heart  disease. Includes description of the disease process and treatment options available to the cardiac patient.   Exercise & Equipment Safety: - Individual verbal instruction and demonstration of equipment use and safety with use of the equipment. Flowsheet Row Cardiac Rehab from 04/27/2016 in [facility] Cardiac and Pulmonary Rehab  Date  04/27/16  Educator  SB  Instruction Review Code  1- partially meets, needs review/practice      Infection Prevention: - Provides verbal and written material to individual with discussion of infection control including proper hand washing and proper equipment cleaning during exercise session. Flowsheet Row Cardiac Rehab from 04/27/2016 in [facility] Cardiac and Pulmonary Rehab  Date  04/27/16  Educator  SB  Instruction Review Code  2- meets goals/outcomes      Falls Prevention: - Provides verbal and written material to individual with discussion of falls prevention and safety. Flowsheet Row Cardiac Rehab from 04/27/2016 in [facility] Cardiac and Pulmonary Rehab  Date  04/27/16  Educator  SB  Instruction Review Code  2- meets goals/outcomes      Diabetes: - Individual verbal and written instruction to review signs/symptoms of diabetes, desired ranges of glucose level fasting, after meals and with exercise. Advice that pre and post exercise glucose checks will be done for 3 sessions at entry of program.    Knowledge Questionnaire Score:     Knowledge Questionnaire Score - 04/27/16 1518      Knowledge Questionnaire Score   Pre Score 21/28      Core Components/Risk Factors/Patient Goals at Admission:     Personal Goals and Risk Factors at Admission - 04/27/16 1530       Core Components/Risk Factors/Patient Goals on Admission    Weight Management Yes;Obesity;Weight Loss   Intervention Weight Management: Develop a combined nutrition and exercise program designed to reach desired caloric intake, while maintaining appropriate intake of nutrient and fiber, sodium and fats, and appropriate energy expenditure required for the weight goal.;Weight Management: Provide education and appropriate resources to help participant work on and attain dietary goals.;Weight Management/Obesity: Establish reasonable short term and long term weight goals.;Obesity: Provide education and appropriate resources to help participant work on and attain dietary goals.   Admit Weight 176 lb 9.6 oz (80.1 kg)   Goal Weight: Short Term 174 lb (78.9 kg)   Goal Weight: Long Term 140 lb (63.5 kg)   Expected Outcomes Short Term: Continue to assess and modify interventions until short term weight is achieved;Long Term: Adherence to nutrition and physical activity/exercise program aimed toward attainment of established weight goal;Weight Loss: Understanding of general recommendations for a balanced deficit meal plan, which promotes 1-2 lb weight loss per week and includes a negative energy balance of [phone] kcal/d;Understanding recommendations for meals to include 15-35% energy as protein, 25-35% energy from fat, 35-60% energy from carbohydrates, less than 200mg  of dietary cholesterol, 20-35 gm of total fiber daily;Understanding of distribution of calorie intake throughout the day with the consumption of 4-5 meals/snacks   Sedentary Yes   Intervention Provide advice, education, support and counseling about physical activity/exercise needs.;Develop an individualized exercise prescription for aerobic and resistive training based on initial evaluation findings, risk stratification, comorbidities and participant's personal goals.   Expected Outcomes Achievement of increased cardiorespiratory fitness and  enhanced flexibility, muscular endurance and strength shown through measurements of functional capacity and personal statement of participant.   Increase Strength and Stamina Yes   Intervention Provide advice, education, support and counseling about physical activity/exercise needs.;Develop an individualized exercise prescription for aerobic and resistive  training based on initial evaluation findings, risk stratification, comorbidities and participant's personal goals.   Expected Outcomes Achievement of increased cardiorespiratory fitness and enhanced flexibility, muscular endurance and strength shown through measurements of functional capacity and personal statement of participant.   Improve shortness of breath with ADL's Yes   Intervention Provide education, individualized exercise plan and daily activity instruction to help decrease symptoms of SOB with activities of daily living.   Expected Outcomes Short Term: Achieves a reduction of symptoms when performing activities of daily living.   Hypertension Yes   Intervention Provide education on lifestyle modifcations including regular physical activity/exercise, weight management, moderate sodium restriction and increased consumption of fresh fruit, vegetables, and low fat dairy, alcohol moderation, and smoking cessation.;Monitor prescription use compliance.   Expected Outcomes Short Term: Continued assessment and intervention until BP is < 140/26mm HG in hypertensive participants. < 130/50mm HG in hypertensive participants with diabetes, heart failure or chronic kidney disease.;Long Term: Maintenance of blood pressure at goal levels.      Core Components/Risk Factors/Patient Goals Review:    Core Components/Risk Factors/Patient Goals at Discharge (Final Review):    ITP Comments:     ITP Comments    Row Name 04/27/16 1415           ITP Comments Medical review was completed today.  Initial ITP created during medical review.  Diagnosis  documented in Discharge note from 03/25/2016 in [employer].          Comments: initial ITP

## 2016-05-02 ENCOUNTER — Encounter: Payer: Self-pay | Admitting: *Deleted

## 2016-05-02 ENCOUNTER — Encounter: Payer: Self-pay | Admitting: Internal Medicine

## 2016-05-02 DIAGNOSIS — Z955 Presence of coronary angioplasty implant and graft: Secondary | ICD-10-CM

## 2016-05-02 DIAGNOSIS — I2111 ST elevation (STEMI) myocardial infarction involving right coronary artery: Secondary | ICD-10-CM

## 2016-05-02 NOTE — Assessment & Plan Note (Signed)
Low carb diet and exercise.  Follow met b and a1c.  a1c just checked 6.2.

## 2016-05-02 NOTE — Assessment & Plan Note (Signed)
Has known disease.  Seeing cardiology.  Currently stable.  Follow.  Plans for cardiac rehab 04/27/16.

## 2016-05-02 NOTE — Assessment & Plan Note (Signed)
Increased stress as outlined.  Discussed with her today.  Has good support.  Does not feel needs anything more at this time.  Follow.

## 2016-05-02 NOTE — Assessment & Plan Note (Signed)
On crestor.  LDL just checked 63.  Low cholesterol diet and exercise.  Follow lipid panel and liver function tests.

## 2016-05-02 NOTE — Assessment & Plan Note (Signed)
Diet and exercise.  Follow.  

## 2016-05-02 NOTE — Assessment & Plan Note (Signed)
CPAP.  

## 2016-05-02 NOTE — Assessment & Plan Note (Signed)
Acid reflux as outlined.  Taking protonix.  Add zantac.  Follow.  Notify me if persistent.

## 2016-05-02 NOTE — Progress Notes (Signed)
Cardiac Individual Treatment Plan  Patient Details  Name: April [name] MRN: [id number] Date of Birth: 05-01-1941 Referring Provider:   Flowsheet Row Cardiac Rehab from 04/27/2016 in [facility] Cardiac and Pulmonary Rehab  Referring Provider  Ida Rogue MD      Initial Encounter Date:  Flowsheet Row Cardiac Rehab from 04/27/2016 in [facility] Cardiac and Pulmonary Rehab  Date  04/27/16  Referring Provider  Ida Rogue MD      Visit Diagnosis: ST elevation myocardial infarction involving right coronary artery [facility])  Status post coronary artery stent placement  Patient's Home Medications on Admission:  Current Outpatient Prescriptions:  .  acetaminophen (TYLENOL) 500 MG tablet, Take 500 mg by mouth daily as needed for headache (pain)., Disp: , Rfl:  .  albuterol (PROVENTIL HFA;VENTOLIN HFA) 108 (90 Base) MCG/ACT inhaler, Inhale 2 puffs into the lungs every 4 (four) hours as needed for wheezing or shortness of breath. (Patient not taking: Reported on 04/27/2016), Disp: 1 Inhaler, Rfl: 6 .  amiodarone (PACERONE) 200 MG tablet, Take 1 tablet (200 mg total) by mouth daily., Disp: 30 tablet, Rfl: 6 .  amLODipine (NORVASC) 2.5 MG tablet, Take 1 tablet (2.5 mg total) by mouth daily., Disp: 30 tablet, Rfl: 6 .  apixaban (ELIQUIS) 5 MG TABS tablet, Take 1 tablet (5 mg total) by mouth 2 (two) times daily., Disp: 60 tablet, Rfl: 6 .  calcium carbonate (TUMS - DOSED IN MG ELEMENTAL CALCIUM) 500 MG chewable tablet, Chew 1-3 tablets by mouth daily as needed for indigestion or heartburn. , Disp: , Rfl:  .  cholecalciferol (VITAMIN D) 1000 units tablet, Take 1,000 Units by mouth 2 (two) times daily., Disp: , Rfl:  .  clopidogrel (PLAVIX) 75 MG tablet, Take 1 tablet (75 mg total) by mouth daily., Disp: 30 tablet, Rfl: 6 .  fish oil-omega-3 fatty acids 1000 MG capsule, Take 1 g by mouth 2 (two) times daily. , Disp: , Rfl:  .  fluticasone (FLONASE) 50 MCG/ACT nasal spray, Place 1-2 sprays into both  nostrils at bedtime. , Disp: , Rfl:  .  fluticasone furoate-vilanterol (BREO ELLIPTA) 100-25 MCG/INH AEPB, Inhale 1 puff into the lungs daily. (Patient taking differently: Inhale 1 puff into the lungs at bedtime. ), Disp: 60 each, Rfl: 5 .  furosemide (LASIX) 20 MG tablet, Take 1 tablet (20 mg total) by mouth daily., Disp: 30 tablet, Rfl: 6 .  losartan-hydrochlorothiazide (HYZAAR) 100-12.5 MG tablet, TAKE ONE (1) TABLET EACH DAY, Disp: 30 tablet, Rfl: 3 .  meloxicam (MOBIC) 15 MG tablet, Take 15 mg by mouth daily as needed for pain. , Disp: , Rfl:  .  Menthol, Topical Analgesic, (BIOFREEZE EX), Apply 1 application topically daily as needed (pain)., Disp: , Rfl:  .  metoprolol (LOPRESSOR) 100 MG tablet, Take 1 tablet (100 mg total) by mouth 2 (two) times daily., Disp: 60 tablet, Rfl: 6 .  Misc Natural Products (OSTEO BI-FLEX JOINT SHIELD PO), Take 1 tablet by mouth 2 (two) times daily. , Disp: , Rfl:  .  Multiple Vitamin (MULTIVITAMIN WITH MINERALS) TABS tablet, Take 0.5 tablets by mouth 2 (two) times daily., Disp: , Rfl:  .  nitroGLYCERIN (NITROSTAT) 0.4 MG SL tablet, Place 1 tablet (0.4 mg total) under the tongue every 5 (five) minutes as needed., Disp: 25 tablet, Rfl: 3 .  pantoprazole (PROTONIX) 40 MG tablet, Take 1 tablet (40 mg total) by mouth daily., Disp: 30 tablet, Rfl: 6 .  Polyethyl Glycol-Propyl Glycol (SYSTANE OP), Place 1 drop into both eyes  daily as needed (dry eyes)., Disp: , Rfl:  .  potassium chloride (K-DUR) 10 MEQ tablet, Take 1 tablet (10 mEq total) by mouth daily as needed. (Patient taking differently: Take 10 mEq by mouth daily as needed (with furosemide (lasix) dose). ), Disp: 30 tablet, Rfl: 11 .  PRESCRIPTION MEDICATION, Inhale into the lungs at bedtime. CPAP, Disp: , Rfl:  .  rosuvastatin (CRESTOR) 40 MG tablet, Take 1 tablet (40 mg total) by mouth daily., Disp: 30 tablet, Rfl: 6 .  traMADol (ULTRAM) 50 MG tablet, Take 50 mg by mouth daily as needed (pain)., Disp: , Rfl:    Past Medical History: Past Medical History:  Diagnosis Date  . Arrhythmia    history of an irregular heart beat  . CAD S/P PCI pRCA Promus DES 3.5 x 16 (4.1 mm), ostRPDA Promus DES 2.5 x 12 (2.7 mm) 03/25/2016  . Diverticulitis   . Endometriosis   . Heart murmur    no significant valvular lesion noted on Echo  . Hiatal hernia   . History of colon polyps 08/1994  . Hypercholesterolemia   . Hypertension   . Migraines    history of migraines  . Osteoporosis   . Rheumatic fever   . Sleep apnea    CPAP  . ST elevation myocardial infarction (STEMI) of inferolateral wall, initial episode of care ([place]) 03/25/2016  . Urine incontinence     Tobacco Use: History  Smoking Status  . Never Smoker  Smokeless Tobacco  . Never Used    Labs: Recent Review Flowsheet Data    Labs for ITP Cardiac and Pulmonary Rehab Latest Ref Rng & Units 06/17/2015 10/01/2015 12/23/2015 03/25/2016 03/26/2016   Cholestrol 0 - 200 mg/dL 195 180 142 166 133   LDLCALC 0 - 99 mg/dL 114(H) 101(H) 66 75 63   HDL >40 mg/dL 65.40 63.80 61.00 72 53   Trlycerides <150 mg/dL 79.0 77.0 78.0 96 87   Hemoglobin A1c 4.8 - 5.6 % 6.5 6.1 6.2 - 6.2(H)   TCO2 0 - 100 mmol/L - - - 25 -       Exercise Target Goals:    Exercise Program Goal: Individual exercise prescription set with THRR, safety & activity barriers. Participant demonstrates ability to understand and report RPE using BORG scale, to self-measure pulse accurately, and to acknowledge the importance of the exercise prescription.  Exercise Prescription Goal: Starting with aerobic activity 30 plus minutes a day, 3 days per week for initial exercise prescription. Provide home exercise prescription and guidelines that participant acknowledges understanding prior to discharge.  Activity Barriers & Risk Stratification:     Activity Barriers & Cardiac Risk Stratification - 04/27/16 1514      Activity Barriers & Cardiac Risk Stratification   Activity Barriers  Arthritis;Back Problems;Joint Problems;Muscular Weakness;Shortness of Breath;Deconditioning;Balance Concerns;History of Falls  r knee pops, uses knee brace; limited ROM overhead   Cardiac Risk Stratification High      6 Minute Walk:     6 Minute Walk    Row Name 04/27/16 1513         6 Minute Walk   Phase Initial     Distance 1255 feet     Walk Time 6 minutes     # of Rest Breaks 0     MPH 2.37     METS 2.76     RPE 13     Perceived Dyspnea  4     VO2 Peak 6.62     Symptoms Yes (comment)  Comments SOB, knee pain 3/10     Resting HR 58 bpm     Resting BP 126/74     Max Ex. HR 88 bpm     Max Ex. BP 128/74     2 Minute Post BP 122/66        Initial Exercise Prescription:     Initial Exercise Prescription - 04/27/16 1500      Date of Initial Exercise RX and Referring Provider   Date 04/27/16   Referring Provider Ida Rogue MD     NuStep   Level 1   Watts --  60-80 spm   Minutes 15   METs 2     REL-XR   Level 1   Watts --  speed 50   Minutes 15   METs 2     Track   Laps 20   Minutes 15   METs 1.92     Prescription Details   Frequency (times per week) 3   Duration Progress to 45 minutes of aerobic exercise without signs/symptoms of physical distress     Intensity   THRR 40-80% of Max Heartrate 92-126   Ratings of Perceived Exertion 11-15     Progression   Progression Continue to progress workloads to maintain intensity without signs/symptoms of physical distress.     Resistance Training   Training Prescription Yes   Weight 2 lbs   Reps 10-12      Perform Capillary Blood Glucose checks as needed.  Exercise Prescription Changes:     Exercise Prescription Changes    Row Name 04/27/16 1500             Exercise Review   Progression -  Walk Test Results         Response to Exercise   Blood Pressure (Admit) 126/74       Blood Pressure (Exercise) 128/74       Blood Pressure (Exit) 122/66       Heart Rate (Admit) 558 bpm        Heart Rate (Exercise) 88 bpm       Heart Rate (Exit) 57 bpm       Oxygen Saturation (Admit) 100 %       Oxygen Saturation (Exercise) 100 %       Rating of Perceived Exertion (Exercise) 13       Perceived Dyspnea (Exercise) 4       Symptoms SOB, knee pain 3/10          Exercise Comments:     Exercise Comments    Row Name 04/27/16 1530           Exercise Comments Tyree wants to build her endurance to be able to make the bed (short) and walk without SOB.          Discharge Exercise Prescription (Final Exercise Prescription Changes):     Exercise Prescription Changes - 04/27/16 1500      Exercise Review   Progression --  Walk Test Results     Response to Exercise   Blood Pressure (Admit) 126/74   Blood Pressure (Exercise) 128/74   Blood Pressure (Exit) 122/66   Heart Rate (Admit) 558 bpm   Heart Rate (Exercise) 88 bpm   Heart Rate (Exit) 57 bpm   Oxygen Saturation (Admit) 100 %   Oxygen Saturation (Exercise) 100 %   Rating of Perceived Exertion (Exercise) 13   Perceived Dyspnea (Exercise) 4   Symptoms SOB, knee pain 3/10  Nutrition:  Target Goals: Understanding of nutrition guidelines, daily intake of sodium 1500mg , cholesterol 200mg , calories 30% from fat and 7% or less from saturated fats, daily to have 5 or more servings of fruits and vegetables.  Biometrics:     Pre Biometrics - 04/27/16 1532      Pre Biometrics   Height 5' 0.9" (1.547 m)   Weight 176 lb 9.6 oz (80.1 kg)   Waist Circumference 36 inches   Hip Circumference 46 inches   Waist to Hip Ratio 0.78 %   BMI (Calculated) 33.5   Single Leg Stand 1.73 seconds       Nutrition Therapy Plan and Nutrition Goals:     Nutrition Therapy & Goals - 04/27/16 1445      Intervention Plan   Intervention Prescribe, educate and counsel regarding individualized specific dietary modifications aiming towards targeted core components such as weight, hypertension, lipid management, diabetes, heart  failure and other comorbidities.   Expected Outcomes Short Term Goal: Understand basic principles of dietary content, such as calories, fat, sodium, cholesterol and nutrients.;Short Term Goal: A plan has been developed with personal nutrition goals set during dietitian appointment.;Long Term Goal: Adherence to prescribed nutrition plan.      Nutrition Discharge: Rate Your Plate Scores:     Nutrition Assessments - 04/27/16 1447      Rate Your Plate Scores   Pre Score 64   Pre Score % 73 %      Nutrition Goals Re-Evaluation:   Psychosocial: Target Goals: Acknowledge presence or absence of depression, maximize coping skills, provide positive support system. Participant is able to verbalize types and ability to use techniques and skills needed for reducing stress and depression.  Initial Review & Psychosocial Screening:     Initial Psych Review & Screening - 04/27/16 1504      Initial Review   Current issues with --  pt voiced no concerns     Family Dynamics   Good Support System? Yes   Comments No psychosocial concerns were identified during medical review with Ms. Pavlik     Barriers   Psychosocial barriers to participate in program There are no identifiable barriers or psychosocial needs.     Screening Interventions   Interventions Encouraged to exercise      Quality of Life Scores:     Quality of Life - 04/27/16 1538      Quality of Life Scores   Health/Function Pre 24.46 %   Socioeconomic Pre 30 %   Psych/Spiritual Pre 28.29 %   Family Pre 30 %   GLOBAL Pre 26.89 %      PHQ-9: Recent Review Flowsheet Data    Depression screen [facility] 2/9 04/27/2016 02/14/2016 05/14/2015 09/07/2014 02/11/2013   Decreased Interest 0 0 0 0 0   Down, Depressed, Hopeless 0 0 0 0 0   PHQ - 2 Score 0 0 0 0 0   Altered sleeping 0 - - - -   Tired, decreased energy 3 - - - -   Change in appetite 0 - - - -   Feeling bad or failure about yourself  0 - - - -   Trouble concentrating 0 -  - - -   Moving slowly or fidgety/restless 0 - - - -   Suicidal thoughts 0 - - - -   PHQ-9 Score 3 - - - -   Difficult doing work/chores Somewhat difficult - - - -      Psychosocial Evaluation and Intervention:  Psychosocial Re-Evaluation:   Vocational Rehabilitation: Provide vocational rehab assistance to qualifying candidates.   Vocational Rehab Evaluation & Intervention:     Vocational Rehab - 04/27/16 1519      Initial Vocational Rehab Evaluation & Intervention   Assessment shows need for Vocational Rehabilitation No      Education: Education Goals: Education classes will be provided on a weekly basis, covering required topics. Participant will state understanding/return demonstration of topics presented.  Learning Barriers/Preferences:     Learning Barriers/Preferences - 04/27/16 1518      Learning Barriers/Preferences   Learning Barriers None   Learning Preferences None      Education Topics: General Nutrition Guidelines/Fats and Fiber: -Group instruction provided by verbal, written material, models and posters to present the general guidelines for heart healthy nutrition. Gives an explanation and review of dietary fats and fiber.   Controlling Sodium/Reading Food Labels: -Group verbal and written material supporting the discussion of sodium use in heart healthy nutrition. Review and explanation with models, verbal and written materials for utilization of the food label.   Exercise Physiology & Risk Factors: - Group verbal and written instruction with models to review the exercise physiology of the cardiovascular system and associated critical values. Details cardiovascular disease risk factors and the goals associated with each risk factor.   Aerobic Exercise & Resistance Training: - Gives group verbal and written discussion on the health impact of inactivity. On the components of aerobic and resistive training programs and the benefits of this training  and how to safely progress through these programs. Flowsheet Row Cardiac Rehab from 04/27/2016 in [facility] Cardiac and Pulmonary Rehab  Date  04/27/16  Educator  SB  Instruction Review Code  2- meets goals/outcomes      Flexibility, Balance, General Exercise Guidelines: - Provides group verbal and written instruction on the benefits of flexibility and balance training programs. Provides general exercise guidelines with specific guidelines to those with heart or lung disease. Demonstration and skill practice provided.   Stress Management: - Provides group verbal and written instruction about the health risks of elevated stress, cause of high stress, and healthy ways to reduce stress.   Depression: - Provides group verbal and written instruction on the correlation between heart/lung disease and depressed mood, treatment options, and the stigmas associated with seeking treatment.   Anatomy & Physiology of the Heart: - Group verbal and written instruction and models provide basic cardiac anatomy and physiology, with the coronary electrical and arterial systems. Review of: AMI, Angina, Valve disease, Heart Failure, Cardiac Arrhythmia, Pacemakers, and the ICD.   Cardiac Procedures: - Group verbal and written instruction and models to describe the testing methods done to diagnose heart disease. Reviews the outcomes of the test results. Describes the treatment choices: Medical Management, Angioplasty, or Coronary Bypass Surgery.   Cardiac Medications: - Group verbal and written instruction to review commonly prescribed medications for heart disease. Reviews the medication, class of the drug, and side effects. Includes the steps to properly store meds and maintain the prescription regimen.   Go Sex-Intimacy & Heart Disease, Get SMART - Goal Setting: - Group verbal and written instruction through game format to discuss heart disease and the return to sexual intimacy. Provides group verbal and  written material to discuss and apply goal setting through the application of the S.M.A.R.T. Method.   Other Matters of the Heart: - Provides group verbal, written materials and models to describe Heart Failure, Angina, Valve Disease, and Diabetes in the realm of heart  disease. Includes description of the disease process and treatment options available to the cardiac patient.   Exercise & Equipment Safety: - Individual verbal instruction and demonstration of equipment use and safety with use of the equipment. Flowsheet Row Cardiac Rehab from 04/27/2016 in [facility] Cardiac and Pulmonary Rehab  Date  04/27/16  Educator  SB  Instruction Review Code  1- partially meets, needs review/practice      Infection Prevention: - Provides verbal and written material to individual with discussion of infection control including proper hand washing and proper equipment cleaning during exercise session. Flowsheet Row Cardiac Rehab from 04/27/2016 in [facility] Cardiac and Pulmonary Rehab  Date  04/27/16  Educator  SB  Instruction Review Code  2- meets goals/outcomes      Falls Prevention: - Provides verbal and written material to individual with discussion of falls prevention and safety. Flowsheet Row Cardiac Rehab from 04/27/2016 in [facility] Cardiac and Pulmonary Rehab  Date  04/27/16  Educator  SB  Instruction Review Code  2- meets goals/outcomes      Diabetes: - Individual verbal and written instruction to review signs/symptoms of diabetes, desired ranges of glucose level fasting, after meals and with exercise. Advice that pre and post exercise glucose checks will be done for 3 sessions at entry of program.    Knowledge Questionnaire Score:     Knowledge Questionnaire Score - 04/27/16 1518      Knowledge Questionnaire Score   Pre Score 21/28      Core Components/Risk Factors/Patient Goals at Admission:     Personal Goals and Risk Factors at Admission - 04/27/16 1530      Core Components/Risk  Factors/Patient Goals on Admission    Weight Management Yes;Obesity;Weight Loss   Intervention Weight Management: Develop a combined nutrition and exercise program designed to reach desired caloric intake, while maintaining appropriate intake of nutrient and fiber, sodium and fats, and appropriate energy expenditure required for the weight goal.;Weight Management: Provide education and appropriate resources to help participant work on and attain dietary goals.;Weight Management/Obesity: Establish reasonable short term and long term weight goals.;Obesity: Provide education and appropriate resources to help participant work on and attain dietary goals.   Admit Weight 176 lb 9.6 oz (80.1 kg)   Goal Weight: Short Term 174 lb (78.9 kg)   Goal Weight: Long Term 140 lb (63.5 kg)   Expected Outcomes Short Term: Continue to assess and modify interventions until short term weight is achieved;Long Term: Adherence to nutrition and physical activity/exercise program aimed toward attainment of established weight goal;Weight Loss: Understanding of general recommendations for a balanced deficit meal plan, which promotes 1-2 lb weight loss per week and includes a negative energy balance of [phone] kcal/d;Understanding recommendations for meals to include 15-35% energy as protein, 25-35% energy from fat, 35-60% energy from carbohydrates, less than 200mg  of dietary cholesterol, 20-35 gm of total fiber daily;Understanding of distribution of calorie intake throughout the day with the consumption of 4-5 meals/snacks   Sedentary Yes   Intervention Provide advice, education, support and counseling about physical activity/exercise needs.;Develop an individualized exercise prescription for aerobic and resistive training based on initial evaluation findings, risk stratification, comorbidities and participant's personal goals.   Expected Outcomes Achievement of increased cardiorespiratory fitness and enhanced flexibility, muscular  endurance and strength shown through measurements of functional capacity and personal statement of participant.   Increase Strength and Stamina Yes   Intervention Provide advice, education, support and counseling about physical activity/exercise needs.;Develop an individualized exercise prescription for aerobic and resistive  training based on initial evaluation findings, risk stratification, comorbidities and participant's personal goals.   Expected Outcomes Achievement of increased cardiorespiratory fitness and enhanced flexibility, muscular endurance and strength shown through measurements of functional capacity and personal statement of participant.   Improve shortness of breath with ADL's Yes   Intervention Provide education, individualized exercise plan and daily activity instruction to help decrease symptoms of SOB with activities of daily living.   Expected Outcomes Short Term: Achieves a reduction of symptoms when performing activities of daily living.   Hypertension Yes   Intervention Provide education on lifestyle modifcations including regular physical activity/exercise, weight management, moderate sodium restriction and increased consumption of fresh fruit, vegetables, and low fat dairy, alcohol moderation, and smoking cessation.;Monitor prescription use compliance.   Expected Outcomes Short Term: Continued assessment and intervention until BP is < 140/80mm HG in hypertensive participants. < 130/38mm HG in hypertensive participants with diabetes, heart failure or chronic kidney disease.;Long Term: Maintenance of blood pressure at goal levels.      Core Components/Risk Factors/Patient Goals Review:    Core Components/Risk Factors/Patient Goals at Discharge (Final Review):    ITP Comments:     ITP Comments    Row Name 04/27/16 1415 05/02/16 0655         ITP Comments Medical review was completed today.  Initial ITP created during medical review.  Diagnosis documented in Discharge  note from 03/25/2016 in Epic. 30 day review. Continue with ITP unless directed changes per Medical Director review. New to program         Comments:

## 2016-05-02 NOTE — Assessment & Plan Note (Signed)
Blood pressure under good control.  Continue same medication regimen.  Follow pressures.  Follow metabolic panel.   

## 2016-05-08 ENCOUNTER — Encounter: Payer: Medicare Other | Attending: Cardiovascular Disease | Admitting: *Deleted

## 2016-05-08 DIAGNOSIS — I213 ST elevation (STEMI) myocardial infarction of unspecified site: Secondary | ICD-10-CM | POA: Diagnosis not present

## 2016-05-08 DIAGNOSIS — Z955 Presence of coronary angioplasty implant and graft: Secondary | ICD-10-CM

## 2016-05-08 DIAGNOSIS — I2111 ST elevation (STEMI) myocardial infarction involving right coronary artery: Secondary | ICD-10-CM

## 2016-05-08 NOTE — Progress Notes (Signed)
Daily Session Note  Patient Details  Name: April [name] MRN: [id number] Date of Birth: 02/01/1941 Referring Provider:   Flowsheet Row Cardiac Rehab from 04/27/2016 in [facility] Cardiac and Pulmonary Rehab  Referring Provider  Gollan, Timothy MD      Encounter Date: 05/08/2016  Check In:     Session Check In - 05/08/16 1628      Check-In   Location [facility]-Cardiac & Pulmonary Rehab   Staff Present Carroll Enterkin, RN, BSN;Kelly Hayes, BS, ACSM CEP, Exercise Physiologist;Susanne Bice, RN, BSN, CCRP   Supervising physician immediately available to respond to emergencies See telemetry face sheet for immediately available ER MD   Medication changes reported     No   Fall or balance concerns reported    No   Warm-up and Cool-down Performed on first and last piece of equipment   Resistance Training Performed Yes   VAD Patient? No     Pain Assessment   Currently in Pain? No/denies   Multiple Pain Sites No         Goals Met:  Exercise tolerated well Personal goals reviewed No report of cardiac concerns or symptoms Strength training completed today  Goals Unmet:  Not Applicable  Comments: First full day of exercise!  Patient was oriented to gym and equipment including functions, settings, policies, and procedures.  Patient's individual exercise prescription and treatment plan were reviewed.  All starting workloads were established based on the results of the 6 minute walk test done at initial orientation visit.  The plan for exercise progression was also introduced and progression will be customized based on patient's performance and goals.    Dr. Mark Miller is Medical Director for [facility] and [facility]. 

## 2016-05-10 ENCOUNTER — Encounter: Payer: Medicare Other | Admitting: *Deleted

## 2016-05-10 DIAGNOSIS — I2111 ST elevation (STEMI) myocardial infarction involving right coronary artery: Secondary | ICD-10-CM

## 2016-05-10 DIAGNOSIS — I213 ST elevation (STEMI) myocardial infarction of unspecified site: Secondary | ICD-10-CM | POA: Diagnosis not present

## 2016-05-10 NOTE — Progress Notes (Signed)
Daily Session Note  Patient Details  Name: April [name] MRN: [id number] Date of Birth: 22-Oct-1940 Referring Provider:   Flowsheet Row Cardiac Rehab from 04/27/2016 in [facility] Cardiac and Pulmonary Rehab  Referring Provider  Ida Rogue MD      Encounter Date: 05/10/2016  Check In:     Session Check In - 05/10/16 1621      Check-In   Location [facility]-Cardiac & Pulmonary Rehab   Staff Present Gerlene Burdock, RN, BSN;Patricia Surles RN Vickki Hearing, BA, ACSM CEP, Exercise Physiologist   Supervising physician immediately available to respond to emergencies See telemetry face sheet for immediately available ER MD   Fall or balance concerns reported    No   Warm-up and Cool-down Performed on first and last piece of equipment   Resistance Training Performed Yes   VAD Patient? No     Pain Assessment   Currently in Pain? No/denies         Goals Met:  Proper associated with RPD/PD & O2 Sat Exercise tolerated well No report of cardiac concerns or symptoms  Goals Unmet:  Not Applicable  Comments:     Dr. Emily Filbert is Medical Director for [place] and [facility].

## 2016-05-11 ENCOUNTER — Encounter: Payer: Self-pay | Admitting: *Deleted

## 2016-05-11 ENCOUNTER — Telehealth: Payer: Self-pay | Admitting: *Deleted

## 2016-05-11 NOTE — Telephone Encounter (Signed)
Jocabed called and said she is sorry that she can't attend Cardiac Rehab today since she is sick.

## 2016-05-15 ENCOUNTER — Encounter: Payer: Medicare Other | Admitting: *Deleted

## 2016-05-15 DIAGNOSIS — I213 ST elevation (STEMI) myocardial infarction of unspecified site: Secondary | ICD-10-CM | POA: Diagnosis not present

## 2016-05-15 DIAGNOSIS — I2111 ST elevation (STEMI) myocardial infarction involving right coronary artery: Secondary | ICD-10-CM

## 2016-05-15 DIAGNOSIS — Z955 Presence of coronary angioplasty implant and graft: Secondary | ICD-10-CM

## 2016-05-15 NOTE — Progress Notes (Signed)
Daily Session Note  Patient Details  Name: KERINA SIMONEAU MRN: [id number] Date of Birth: [birth year] Referring Provider:   Flowsheet Row Cardiac Rehab from 04/27/2016 in [facility] Cardiac and [facility] Rehab  Referring Provider  Ida Rogue MD      Encounter Date: 05/15/2016  Check In:     Session Check In - 05/15/16 1619      Check-In   Location [facility]-Cardiac & Pulmonary Rehab   Staff Present Gerlene Burdock, RN, BSN;Susanne Bice, RN, BSN, Laveda Norman, BS, ACSM CEP, Exercise Physiologist   Supervising physician immediately available to respond to emergencies See telemetry face sheet for immediately available ER MD   Medication changes reported     No   Fall or balance concerns reported    No   Warm-up and Cool-down Performed on first and last piece of equipment   Resistance Training Performed Yes   VAD Patient? No     Pain Assessment   Currently in Pain? No/denies   Multiple Pain Sites No         Goals Met:  Independence with exercise equipment Exercise tolerated well Personal goals reviewed No report of cardiac concerns or symptoms Strength training completed today  Goals Unmet:  Not Applicable  Comments: Pt able to follow exercise prescription today without complaint.  Will continue to monitor for progression.    Dr. Emily Filbert is Medical Director for [place] and [facility].

## 2016-05-16 ENCOUNTER — Other Ambulatory Visit: Payer: Medicare Other

## 2016-05-18 ENCOUNTER — Encounter: Payer: Medicare Other | Admitting: Internal Medicine

## 2016-05-22 ENCOUNTER — Encounter: Payer: Medicare Other | Admitting: *Deleted

## 2016-05-22 DIAGNOSIS — Z955 Presence of coronary angioplasty implant and graft: Secondary | ICD-10-CM

## 2016-05-22 DIAGNOSIS — I213 ST elevation (STEMI) myocardial infarction of unspecified site: Secondary | ICD-10-CM | POA: Diagnosis not present

## 2016-05-22 DIAGNOSIS — I2111 ST elevation (STEMI) myocardial infarction involving right coronary artery: Secondary | ICD-10-CM

## 2016-05-22 NOTE — Progress Notes (Signed)
Daily Session Note  Patient Details  Name: April [name] MRN: [id number] Date of Birth: 05/13/1940 Referring Provider:   Flowsheet Row Cardiac Rehab from 04/27/2016 in [facility] Cardiac and Pulmonary Rehab  Referring Provider  Ida Rogue MD      Encounter Date: 05/22/2016  Check In:     Session Check In - 05/22/16 1821      Check-In   Staff Present Heath Lark, RN, BSN, CCRP;Carroll Enterkin, RN, Moises Blood, BS, ACSM CEP, Exercise Physiologist   Supervising physician immediately available to respond to emergencies See telemetry face sheet for immediately available ER MD   Medication changes reported     No   Fall or balance concerns reported    No   Warm-up and Cool-down Performed on first and last piece of equipment   Resistance Training Performed Yes   VAD Patient? No     Pain Assessment   Currently in Pain? No/denies         Goals Met:  Exercise tolerated well No report of cardiac concerns or symptoms Strength training completed today  Goals Unmet:  Not Applicable  Comments: Doing well with exercise prescription progression.    Dr. Emily Filbert is Medical Director for [place] and [facility].

## 2016-05-24 ENCOUNTER — Encounter: Payer: Medicare Other | Admitting: *Deleted

## 2016-05-24 DIAGNOSIS — I213 ST elevation (STEMI) myocardial infarction of unspecified site: Secondary | ICD-10-CM | POA: Diagnosis not present

## 2016-05-24 DIAGNOSIS — I2111 ST elevation (STEMI) myocardial infarction involving right coronary artery: Secondary | ICD-10-CM

## 2016-05-24 NOTE — Progress Notes (Signed)
Daily Session Note  Patient Details  Name: April [name] MRN: [id number] Date of Birth: [birth year] Referring Provider:   Flowsheet Row Cardiac Rehab from 04/27/2016 in [facility] Cardiac and Pulmonary Rehab  Referring Provider  Ida Rogue MD      Encounter Date: 05/24/2016  Check In:     Session Check In - 05/24/16 1712      Check-In   Location [facility]-Cardiac & Pulmonary Rehab   Staff Present Gerlene Burdock, RN, Vickki Hearing, BA, ACSM CEP, Exercise Physiologist;Patricia Surles RN BSN   Supervising physician immediately available to respond to emergencies See telemetry face sheet for immediately available ER MD   Medication changes reported     No   Fall or balance concerns reported    No   Warm-up and Cool-down Performed on first and last piece of equipment   Resistance Training Performed Yes   VAD Patient? No     Pain Assessment   Currently in Pain? No/denies         Goals Met:  Proper associated with RPD/PD & O2 Sat Exercise tolerated well  Goals Unmet:  Not Applicable  Comments:     Dr. Emily Filbert is Medical Director for [place] and [facility].

## 2016-05-25 DIAGNOSIS — I213 ST elevation (STEMI) myocardial infarction of unspecified site: Secondary | ICD-10-CM | POA: Diagnosis not present

## 2016-05-25 DIAGNOSIS — I2111 ST elevation (STEMI) myocardial infarction involving right coronary artery: Secondary | ICD-10-CM

## 2016-05-25 DIAGNOSIS — Z955 Presence of coronary angioplasty implant and graft: Secondary | ICD-10-CM

## 2016-05-25 NOTE — Progress Notes (Signed)
Daily Session Note  Patient Details  Name: April [name] MRN: [id number] Date of Birth: Jan 17, 1941 Referring Provider:   Flowsheet Row Cardiac Rehab from 04/27/2016 in [facility] Cardiac and Pulmonary Rehab  Referring Provider  Ida Rogue MD      Encounter Date: 05/25/2016  Check In:     Session Check In - 05/25/16 1734      Check-In   Location [facility]-Cardiac & Pulmonary Rehab   Staff Present Levell July RN Moises Blood, BS, ACSM CEP, Exercise Physiologist;Medea Deines Oletta Darter, [place], ACSM CEP, Exercise Physiologist   Supervising physician immediately available to respond to emergencies See telemetry face sheet for immediately available ER MD   Medication changes reported     No   Fall or balance concerns reported    No   Warm-up and Cool-down Performed on first and last piece of equipment   Resistance Training Performed Yes   VAD Patient? No     Pain Assessment   Currently in Pain? No/denies   Multiple Pain Sites No         Goals Met:  Independence with exercise equipment Exercise tolerated well No report of cardiac concerns or symptoms Strength training completed today  Goals Unmet:  Not Applicable  Comments: Pt able to follow exercise prescription today without complaint.  Will continue to monitor for progression.    Dr. Emily Filbert is Medical Director for [place] and [facility].

## 2016-05-29 ENCOUNTER — Telehealth: Payer: Self-pay | Admitting: *Deleted

## 2016-05-29 NOTE — Telephone Encounter (Signed)
Dayja called today to say she is sick and will not be here for classs

## 2016-05-30 ENCOUNTER — Other Ambulatory Visit: Payer: Self-pay

## 2016-05-30 DIAGNOSIS — Z1231 Encounter for screening mammogram for malignant neoplasm of breast: Secondary | ICD-10-CM

## 2016-05-31 ENCOUNTER — Encounter: Payer: Self-pay | Admitting: *Deleted

## 2016-05-31 DIAGNOSIS — I2111 ST elevation (STEMI) myocardial infarction involving right coronary artery: Secondary | ICD-10-CM

## 2016-05-31 DIAGNOSIS — Z955 Presence of coronary angioplasty implant and graft: Secondary | ICD-10-CM

## 2016-05-31 DIAGNOSIS — I213 ST elevation (STEMI) myocardial infarction of unspecified site: Secondary | ICD-10-CM | POA: Diagnosis not present

## 2016-05-31 NOTE — Progress Notes (Signed)
Cardiac Individual Treatment Plan  Patient Details  Name: April [name] MRN: [id number] Date of Birth: 05-01-1941 Referring Provider:   Flowsheet Row Cardiac Rehab from 04/27/2016 in [facility] Cardiac and Pulmonary Rehab  Referring Provider  Ida Rogue MD      Initial Encounter Date:  Flowsheet Row Cardiac Rehab from 04/27/2016 in [facility] Cardiac and Pulmonary Rehab  Date  04/27/16  Referring Provider  Ida Rogue MD      Visit Diagnosis: ST elevation myocardial infarction involving right coronary artery [facility])  Status post coronary artery stent placement  Patient's Home Medications on Admission:  Current Outpatient Prescriptions:  .  acetaminophen (TYLENOL) 500 MG tablet, Take 500 mg by mouth daily as needed for headache (pain)., Disp: , Rfl:  .  albuterol (PROVENTIL HFA;VENTOLIN HFA) 108 (90 Base) MCG/ACT inhaler, Inhale 2 puffs into the lungs every 4 (four) hours as needed for wheezing or shortness of breath. (Patient not taking: Reported on 04/27/2016), Disp: 1 Inhaler, Rfl: 6 .  amiodarone (PACERONE) 200 MG tablet, Take 1 tablet (200 mg total) by mouth daily., Disp: 30 tablet, Rfl: 6 .  amLODipine (NORVASC) 2.5 MG tablet, Take 1 tablet (2.5 mg total) by mouth daily., Disp: 30 tablet, Rfl: 6 .  apixaban (ELIQUIS) 5 MG TABS tablet, Take 1 tablet (5 mg total) by mouth 2 (two) times daily., Disp: 60 tablet, Rfl: 6 .  calcium carbonate (TUMS - DOSED IN MG ELEMENTAL CALCIUM) 500 MG chewable tablet, Chew 1-3 tablets by mouth daily as needed for indigestion or heartburn. , Disp: , Rfl:  .  cholecalciferol (VITAMIN D) 1000 units tablet, Take 1,000 Units by mouth 2 (two) times daily., Disp: , Rfl:  .  clopidogrel (PLAVIX) 75 MG tablet, Take 1 tablet (75 mg total) by mouth daily., Disp: 30 tablet, Rfl: 6 .  fish oil-omega-3 fatty acids 1000 MG capsule, Take 1 g by mouth 2 (two) times daily. , Disp: , Rfl:  .  fluticasone (FLONASE) 50 MCG/ACT nasal spray, Place 1-2 sprays into both  nostrils at bedtime. , Disp: , Rfl:  .  fluticasone furoate-vilanterol (BREO ELLIPTA) 100-25 MCG/INH AEPB, Inhale 1 puff into the lungs daily. (Patient taking differently: Inhale 1 puff into the lungs at bedtime. ), Disp: 60 each, Rfl: 5 .  furosemide (LASIX) 20 MG tablet, Take 1 tablet (20 mg total) by mouth daily., Disp: 30 tablet, Rfl: 6 .  losartan-hydrochlorothiazide (HYZAAR) 100-12.5 MG tablet, TAKE ONE (1) TABLET EACH DAY, Disp: 30 tablet, Rfl: 3 .  meloxicam (MOBIC) 15 MG tablet, Take 15 mg by mouth daily as needed for pain. , Disp: , Rfl:  .  Menthol, Topical Analgesic, (BIOFREEZE EX), Apply 1 application topically daily as needed (pain)., Disp: , Rfl:  .  metoprolol (LOPRESSOR) 100 MG tablet, Take 1 tablet (100 mg total) by mouth 2 (two) times daily., Disp: 60 tablet, Rfl: 6 .  Misc Natural Products (OSTEO BI-FLEX JOINT SHIELD PO), Take 1 tablet by mouth 2 (two) times daily. , Disp: , Rfl:  .  Multiple Vitamin (MULTIVITAMIN WITH MINERALS) TABS tablet, Take 0.5 tablets by mouth 2 (two) times daily., Disp: , Rfl:  .  nitroGLYCERIN (NITROSTAT) 0.4 MG SL tablet, Place 1 tablet (0.4 mg total) under the tongue every 5 (five) minutes as needed., Disp: 25 tablet, Rfl: 3 .  pantoprazole (PROTONIX) 40 MG tablet, Take 1 tablet (40 mg total) by mouth daily., Disp: 30 tablet, Rfl: 6 .  Polyethyl Glycol-Propyl Glycol (SYSTANE OP), Place 1 drop into both eyes  daily as needed (dry eyes)., Disp: , Rfl:  .  potassium chloride (K-DUR) 10 MEQ tablet, Take 1 tablet (10 mEq total) by mouth daily as needed. (Patient taking differently: Take 10 mEq by mouth daily as needed (with furosemide (lasix) dose). ), Disp: 30 tablet, Rfl: 11 .  PRESCRIPTION MEDICATION, Inhale into the lungs at bedtime. CPAP, Disp: , Rfl:  .  rosuvastatin (CRESTOR) 40 MG tablet, Take 1 tablet (40 mg total) by mouth daily., Disp: 30 tablet, Rfl: 6 .  traMADol (ULTRAM) 50 MG tablet, Take 50 mg by mouth daily as needed (pain)., Disp: , Rfl:    Past Medical History: Past Medical History:  Diagnosis Date  . Arrhythmia    history of an irregular heart beat  . CAD S/P PCI pRCA Promus DES 3.5 x 16 (4.1 mm), ostRPDA Promus DES 2.5 x 12 (2.7 mm) 03/25/2016  . Diverticulitis   . Endometriosis   . Heart murmur    no significant valvular lesion noted on Echo  . Hiatal hernia   . History of colon polyps 08/1994  . Hypercholesterolemia   . Hypertension   . Migraines    history of migraines  . Osteoporosis   . Rheumatic fever   . Sleep apnea    CPAP  . ST elevation myocardial infarction (STEMI) of inferolateral wall, initial episode of care ([place]) 03/25/2016  . Urine incontinence     Tobacco Use: History  Smoking Status  . Never Smoker  Smokeless Tobacco  . Never Used    Labs: Recent Review Flowsheet Data    Labs for ITP Cardiac and Pulmonary Rehab Latest Ref Rng & Units 06/17/2015 10/01/2015 12/23/2015 03/25/2016 03/26/2016   Cholestrol 0 - 200 mg/dL 195 180 142 166 133   LDLCALC 0 - 99 mg/dL 114(H) 101(H) 66 75 63   HDL >40 mg/dL 65.40 63.80 61.00 72 53   Trlycerides <150 mg/dL 79.0 77.0 78.0 96 87   Hemoglobin A1c 4.8 - 5.6 % 6.5 6.1 6.2 - 6.2(H)   TCO2 0 - 100 mmol/L - - - 25 -       Exercise Target Goals:    Exercise Program Goal: Individual exercise prescription set with THRR, safety & activity barriers. Participant demonstrates ability to understand and report RPE using BORG scale, to self-measure pulse accurately, and to acknowledge the importance of the exercise prescription.  Exercise Prescription Goal: Starting with aerobic activity 30 plus minutes a day, 3 days per week for initial exercise prescription. Provide home exercise prescription and guidelines that participant acknowledges understanding prior to discharge.  Activity Barriers & Risk Stratification:     Activity Barriers & Cardiac Risk Stratification - 04/27/16 1514      Activity Barriers & Cardiac Risk Stratification   Activity Barriers  Arthritis;Back Problems;Joint Problems;Muscular Weakness;Shortness of Breath;Deconditioning;Balance Concerns;History of Falls  r knee pops, uses knee brace; limited ROM overhead   Cardiac Risk Stratification High      6 Minute Walk:     6 Minute Walk    Row Name 04/27/16 1513         6 Minute Walk   Phase Initial     Distance 1255 feet     Walk Time 6 minutes     # of Rest Breaks 0     MPH 2.37     METS 2.76     RPE 13     Perceived Dyspnea  4     VO2 Peak 6.62     Symptoms Yes (comment)  Comments SOB, knee pain 3/10     Resting HR 58 bpm     Resting BP 126/74     Max Ex. HR 88 bpm     Max Ex. BP 128/74     2 Minute Post BP 122/66        Initial Exercise Prescription:     Initial Exercise Prescription - 04/27/16 1500      Date of Initial Exercise RX and Referring Provider   Date 04/27/16   Referring Provider Ida Rogue MD     NuStep   Level 1   Watts --  60-80 spm   Minutes 15   METs 2     REL-XR   Level 1   Watts --  speed 50   Minutes 15   METs 2     Track   Laps 20   Minutes 15   METs 1.92     Prescription Details   Frequency (times per week) 3   Duration Progress to 45 minutes of aerobic exercise without signs/symptoms of physical distress     Intensity   THRR 40-80% of Max Heartrate 92-126   Ratings of Perceived Exertion 11-15     Progression   Progression Continue to progress workloads to maintain intensity without signs/symptoms of physical distress.     Resistance Training   Training Prescription Yes   Weight 2 lbs   Reps 10-12      Perform Capillary Blood Glucose checks as needed.  Exercise Prescription Changes:     Exercise Prescription Changes    Row Name 04/27/16 1500 05/11/16 1100 05/26/16 1100         Exercise Review   Progression -  Walk Test Results  -  -       Response to Exercise   Blood Pressure (Admit) 126/74 106/62 118/54     Blood Pressure (Exercise) 128/74 132/60 124/54     Blood Pressure  (Exit) 122/66 124/78 98/48     Heart Rate (Admit) 558 bpm 76 bpm 59 bpm     Heart Rate (Exercise) 88 bpm 94 bpm 94 bpm     Heart Rate (Exit) 57 bpm 73 bpm 66 bpm     Oxygen Saturation (Admit) 100 %  -  -     Oxygen Saturation (Exercise) 100 %  -  -     Rating of Perceived Exertion (Exercise) _0 Perceived Dyspnea (Exercise) 4  -  -     Symptoms SOB, knee pain 3/10  -  -     Duration  - Progress to 45 minutes of aerobic exercise without signs/symptoms of physical distress Progress to 45 minutes of aerobic exercise without signs/symptoms of physical distress     Intensity  - THRR unchanged THRR unchanged       Progression   Progression  - Continue to progress workloads to maintain intensity without signs/symptoms of physical distress. Continue to progress workloads to maintain intensity without signs/symptoms of physical distress.       Resistance Training   Training Prescription  - Yes Yes     Weight  - 2 2     Reps  - 10-12 10-12       Interval Training   Interval Training  - No No       REL-XR   Level  -  - 1     Minutes  -  - 15     METs  -  -  4.5       Biostep-RELP   Level  - 1 1     Minutes  - 15 15       Track   Minutes  - 15  -        Exercise Comments:     Exercise Comments    Row Name 04/27/16 1530 05/08/16 1810 05/11/16 1129 05/26/16 1201     Exercise Comments Korra wants to build her endurance to be able to make the bed (short) and walk without SOB. First full day of exercise!  Patient was oriented to gym and equipment including functions, settings, policies, and procedures.  Patient's individual exercise prescription and treatment plan were reviewed.  All starting workloads were established based on the results of the 6 minute walk test done at initial orientation visit.  The plan for exercise progression was also introduced and progression will be customized based on patient's performance and goals. Zonnie has done well in her first week of exercise.  Merline is tolerating exercise very well.       Discharge Exercise Prescription (Final Exercise Prescription Changes):     Exercise Prescription Changes - 05/26/16 1100      Response to Exercise   Blood Pressure (Admit) 118/54   Blood Pressure (Exercise) 124/54   Blood Pressure (Exit) 98/48   Heart Rate (Admit) 59 bpm   Heart Rate (Exercise) 94 bpm   Heart Rate (Exit) 66 bpm   Rating of Perceived Exertion (Exercise) 14   Duration Progress to 45 minutes of aerobic exercise without signs/symptoms of physical distress   Intensity THRR unchanged     Progression   Progression Continue to progress workloads to maintain intensity without signs/symptoms of physical distress.     Resistance Training   Training Prescription Yes   Weight 2   Reps 10-12     Interval Training   Interval Training No     REL-XR   Level 1   Minutes 15   METs 4.5     Biostep-RELP   Level 1   Minutes 15      Nutrition:  Target Goals: Understanding of nutrition guidelines, daily intake of sodium <1569m, cholesterol <2075m calories 30% from fat and 7% or less from saturated fats, daily to have 5 or more servings of fruits and vegetables.  Biometrics:     Pre Biometrics - 04/27/16 1532      Pre Biometrics   Height 5' 0.9" (1.547 m)   Weight 176 lb 9.6 oz (80.1 kg)   Waist Circumference 36 inches   Hip Circumference 46 inches   Waist to Hip Ratio 0.78 %   BMI (Calculated) 33.5   Single Leg Stand 1.73 seconds       Nutrition Therapy Plan and Nutrition Goals:     Nutrition Therapy & Goals - 05/15/16 1806      Nutrition Therapy   Drug/Food Interactions Statins/Certain Fruits     Personal Nutrition Goals   Comments GlMirabelleill meet with the Cardiac REhab REgistered Dietician next week. [facility] husband and daughter [facility]. [facility]      Nutrition Discharge: Rate Your Plate Scores:     Nutrition  Assessments - 04/27/16 1447      Rate Your Plate Scores   Pre Score 64   Pre Score % 73 %      Nutrition Goals Re-Evaluation:     Nutrition Goals Re-Evaluation  [place] Name 05/24/16 1909             Personal Goal #1 Re-Evaluation   Goal Progress Seen Yes       Comments Haylo said at [facility] they have hearts on the healthy meal items. Like today she got the chicken wrap with veggies.  To meet with Dietician next week. Ercie said she has cut back on her portions. and she eats vegetables.          Psychosocial: Target Goals: Acknowledge presence or absence of depression, maximize coping skills, provide positive support system. Participant is able to verbalize types and ability to use techniques and skills needed for reducing stress and depression.  Initial Review & Psychosocial Screening:     Initial Psych Review & Screening - 04/27/16 1504      Initial Review   Current issues with --  pt voiced no concerns     Family Dynamics   Good Support System? Yes   Comments No psychosocial concerns were identified during medical review with Ms. Eidson     Barriers   Psychosocial barriers to participate in program There are no identifiable barriers or psychosocial needs.     Screening Interventions   Interventions Encouraged to exercise      Quality of Life Scores:     Quality of Life - 04/27/16 1538      Quality of Life Scores   Health/Function Pre 24.46 %   Socioeconomic Pre 30 %   Psych/Spiritual Pre 28.29 %   Family Pre 30 %   GLOBAL Pre 26.89 %      PHQ-9: Recent Review Flowsheet Data    Depression screen [facility] 2/9 04/27/2016 02/14/2016 05/14/2015 09/07/2014 02/11/2013   Decreased Interest 0 0 0 0 0   Down, Depressed, Hopeless 0 0 0 0 0   PHQ - 2 Score 0 0 0 0 0   Altered sleeping 0 - - - -   Tired, decreased energy 3 - - - -   Change in appetite 0 - - - -   Feeling bad or failure about yourself  0 - - - -   Trouble concentrating 0 - - - -   Moving  slowly or fidgety/restless 0 - - - -   Suicidal thoughts 0 - - - -   PHQ-9 Score 3 - - - -   Difficult doing work/chores Somewhat difficult - - - -      Psychosocial Evaluation and Intervention:     Psychosocial Evaluation - 05/15/16 1744      Psychosocial Evaluation & Interventions   Interventions Encouraged to exercise with the program and follow exercise prescription   Comments Counselor met with Ms. Lepp today for initial psychosocial evaluation.  She is a 76 year old who had a heart attack and two stents inserted late November.  She has a strong support system with a son who lives close by; active involvement in her local church, and lives in a retirement community with lots of friends who check in on her.  Ms. Chauncey Cruel states she sleeps "okay" even with her CPAP and gets approximately 6-7 hours average of sleep per night.  She has a good appetite and denies a history or current symptoms of depression or anxiety.  Mr. Chauncey Cruel states she is typically in a positive mood; although she became tearful when speaking of her daughter's death this past 08/18/2022.  Counselor encouraged a grief share group to help her with the grief she is experiencing  in a supportive environment.  Ms. Chauncey Cruel has goals to lose weight and develop some muscle strength while in this program.  She plans to return to her retirement community gym upon completion of this program.  Staff will follow with Ms. Chauncey Cruel over the course of this program.        Psychosocial Re-Evaluation:     Psychosocial Re-Evaluation    Row Name 05/11/16 1332 05/15/16 1752 05/24/16 1904         Psychosocial Re-Evaluation   Interventions  - Encouraged to attend Cardiac Rehabilitation for the exercise  -     Comments Kambrie called and said she is sorry that she can't attend Cardiac Rehab today since she is sick.  Shaketa was given information card for the Heart Woman's support group. Dasja said she missed her daughter who was always checking on her. Her daughter  had a stroke and then heart problems. Audree said she was told about Grief Group at [facility] but she is trying to get moved in totally into [place].      Continued Psychosocial Services Needed  - Yes  -        Vocational Rehabilitation: Provide vocational rehab assistance to qualifying candidates.   Vocational Rehab Evaluation & Intervention:     Vocational Rehab - 04/27/16 1519      Initial Vocational Rehab Evaluation & Intervention   Assessment shows need for Vocational Rehabilitation No      Education: Education Goals: Education classes will be provided on a weekly basis, covering required topics. Participant will state understanding/return demonstration of topics presented.  Learning Barriers/Preferences:     Learning Barriers/Preferences - 04/27/16 1518      Learning Barriers/Preferences   Learning Barriers None   Learning Preferences None      Education Topics: General Nutrition Guidelines/Fats and Fiber: -Group instruction provided by verbal, written material, models and posters to present the general guidelines for heart healthy nutrition. Gives an explanation and review of dietary fats and fiber. Flowsheet Row Cardiac Rehab from 05/24/2016 in [facility] Cardiac and Pulmonary Rehab  Date  05/08/16  Educator  PI  Instruction Review Code  2- meets goals/outcomes      Controlling Sodium/Reading Food Labels: -Group verbal and written material supporting the discussion of sodium use in heart healthy nutrition. Review and explanation with models, verbal and written materials for utilization of the food label. Flowsheet Row Cardiac Rehab from 05/24/2016 in [facility] Cardiac and Pulmonary Rehab  Date  05/15/16  Educator  PI  Instruction Review Code  2- meets goals/outcomes      Exercise Physiology & Risk Factors: - Group verbal and written instruction with models to review the exercise physiology of the cardiovascular system and associated critical values. Details  cardiovascular disease risk factors and the goals associated with each risk factor. Flowsheet Row Cardiac Rehab from 05/24/2016 in [facility] Cardiac and Pulmonary Rehab  Date  05/22/16  Educator  [facility]  Instruction Review Code  2- meets goals/outcomes      Aerobic Exercise & Resistance Training: - Gives group verbal and written discussion on the health impact of inactivity. On the components of aerobic and resistive training programs and the benefits of this training and how to safely progress through these programs. Flowsheet Row Cardiac Rehab from 05/24/2016 in [facility] Cardiac and Pulmonary Rehab  Date  05/24/16  Educator  AS  Instruction Review Code  2- meets goals/outcomes      Flexibility, Balance, General Exercise Guidelines: - Provides group verbal and  written instruction on the benefits of flexibility and balance training programs. Provides general exercise guidelines with specific guidelines to those with heart or lung disease. Demonstration and skill practice provided.   Stress Management: - Provides group verbal and written instruction about the health risks of elevated stress, cause of high stress, and healthy ways to reduce stress.   Depression: - Provides group verbal and written instruction on the correlation between heart/lung disease and depressed mood, treatment options, and the stigmas associated with seeking treatment. Flowsheet Row Cardiac Rehab from 05/24/2016 in [facility] Cardiac and Pulmonary Rehab  Date  05/10/16  Educator  Lucianne Lei, MSW  Instruction Review Code  2- meets goals/outcomes      Anatomy & Physiology of the Heart: - Group verbal and written instruction and models provide basic cardiac anatomy and physiology, with the coronary electrical and arterial systems. Review of: AMI, Angina, Valve disease, Heart Failure, Cardiac Arrhythmia, Pacemakers, and the ICD.   Cardiac Procedures: - Group verbal and written instruction and models to describe the testing  methods done to diagnose heart disease. Reviews the outcomes of the test results. Describes the treatment choices: Medical Management, Angioplasty, or Coronary Bypass Surgery.   Cardiac Medications: - Group verbal and written instruction to review commonly prescribed medications for heart disease. Reviews the medication, class of the drug, and side effects. Includes the steps to properly store meds and maintain the prescription regimen.   Go Sex-Intimacy & Heart Disease, Get SMART - Goal Setting: - Group verbal and written instruction through game format to discuss heart disease and the return to sexual intimacy. Provides group verbal and written material to discuss and apply goal setting through the application of the S.M.A.R.T. Method.   Other Matters of the Heart: - Provides group verbal, written materials and models to describe Heart Failure, Angina, Valve Disease, and Diabetes in the realm of heart disease. Includes description of the disease process and treatment options available to the cardiac patient.   Exercise & Equipment Safety: - Individual verbal instruction and demonstration of equipment use and safety with use of the equipment. Flowsheet Row Cardiac Rehab from 05/24/2016 in [facility] Cardiac and Pulmonary Rehab  Date  04/27/16  Educator  SB  Instruction Review Code  1- partially meets, needs review/practice      Infection Prevention: - Provides verbal and written material to individual with discussion of infection control including proper hand washing and proper equipment cleaning during exercise session. Flowsheet Row Cardiac Rehab from 05/24/2016 in [facility] Cardiac and Pulmonary Rehab  Date  04/27/16  Educator  SB  Instruction Review Code  2- meets goals/outcomes      Falls Prevention: - Provides verbal and written material to individual with discussion of falls prevention and safety. Flowsheet Row Cardiac Rehab from 05/24/2016 in [facility] Cardiac and Pulmonary Rehab  Date   04/27/16  Educator  SB  Instruction Review Code  2- meets goals/outcomes      Diabetes: - Individual verbal and written instruction to review signs/symptoms of diabetes, desired ranges of glucose level fasting, after meals and with exercise. Advice that pre and post exercise glucose checks will be done for 3 sessions at entry of program.    Knowledge Questionnaire Score:     Knowledge Questionnaire Score - 04/27/16 1518      Knowledge Questionnaire Score   Pre Score 21/28      Core Components/Risk Factors/Patient Goals at Admission:     Personal Goals and Risk Factors at Admission - 05/15/16 1741  Core Components/Risk Factors/Patient Goals on Admission    Weight Management Yes      Core Components/Risk Factors/Patient Goals Review:      Goals and Risk Factor Review    Row Name 05/15/16 1748 05/15/16 1750 05/15/16 1803 05/24/16 1906 05/24/16 1911     Core Components/Risk Factors/Patient Goals Review   Personal Goals Review Weight Management/Obesity  -  - Sedentary;Increase Strength and Stamina  -   Review Kimila admits to gaining weight since being in Cardiac REhab but she said she had her heart attack when she was suppose to move to [facility] so she just had them bring her bed and tv. Orlinda Blalock said her daughter died a couple of years ago of heart problems and her husband died 3 years ago.  Glaydne's blood pressure after exercising for 10-15 minutes on the recumbent elliptical was 140/80.  Makenlee has an appt with the Cardiac Rehab Registered Dietician next week. Audreyanna said she has gained weight since she has been here but has a lot of stressors going on.  Juliana was able to walk around the gym today but had to stop after so many laps due to her hip/back hurt at times. Pati said she is still adjusting to [facility] retirment facility. Kinsley said she is to see the Registered dietiican on Feb 7th and was suppse to see Dietican today but dietician is sick todya.  Clara  said she has never smoked but around her 45 year old aunt who smokes and others. Her 49 year old aunt is a inspiration in the fact that she is still exercising twice a week and has exercised all her life.    Expected Outcomes  - Improve to have a heart healthy lifestyle.  Heart healthy lifestyle Heart healthy living.   -      Core Components/Risk Factors/Patient Goals at Discharge (Final Review):      Goals and Risk Factor Review - 05/24/16 1911      Core Components/Risk Factors/Patient Goals Review   Review Cheron said she has never smoked but around her 58 year old aunt who smokes and others. Her 40 year old aunt is a inspiration in the fact that she is still exercising twice a week and has exercised all her life.       ITP Comments:     ITP Comments    Row Name 04/27/16 1415 05/02/16 0655 05/11/16 1332 05/31/16 0652     ITP Comments Medical review was completed today.  Initial ITP created during medical review.  Diagnosis documented in Discharge note from 03/25/2016 in Epic. 30 day review. Continue with ITP unless directed changes per Medical Director review. New to program [place] called and said she is sorry that she can't attend Cardiac Rehab today since she is sick.  30 day review. Continue with ITP unless directed changes per Medical Director review. New to program       Comments:

## 2016-05-31 NOTE — Progress Notes (Signed)
Daily Session Note  Patient Details  Name: April [name] MRN: [id number] Date of Birth: 1941/02/11 Referring Provider:   Flowsheet Row Cardiac Rehab from 04/27/2016 in [facility] Cardiac and Pulmonary Rehab  Referring Provider  Ida Rogue MD      Encounter Date: 05/31/2016  Check In:     Session Check In - 05/31/16 1721      Check-In   Location [facility]-Cardiac & Pulmonary Rehab   Staff Present Levell July RN BSN;Carroll Enterkin, RN, Vickki Hearing, BA, ACSM CEP, Exercise Physiologist   Supervising physician immediately available to respond to emergencies See telemetry face sheet for immediately available ER MD   Medication changes reported     No   Fall or balance concerns reported    No   Warm-up and Cool-down Performed on first and last piece of equipment   Resistance Training Performed Yes   VAD Patient? No     Pain Assessment   Currently in Pain? No/denies   Multiple Pain Sites No         Goals Met:  Independence with exercise equipment Exercise tolerated well No report of cardiac concerns or symptoms Strength training completed today  Goals Unmet:  Not Applicable  Comments: Pt able to follow exercise prescription today without complaint.  Will continue to monitor for progression.    Dr. Emily Filbert is Medical Director for [place] and [facility].

## 2016-06-01 ENCOUNTER — Encounter: Payer: Medicare Other | Attending: Cardiovascular Disease

## 2016-06-01 DIAGNOSIS — I213 ST elevation (STEMI) myocardial infarction of unspecified site: Secondary | ICD-10-CM | POA: Diagnosis not present

## 2016-06-01 DIAGNOSIS — Z955 Presence of coronary angioplasty implant and graft: Secondary | ICD-10-CM

## 2016-06-01 DIAGNOSIS — I2111 ST elevation (STEMI) myocardial infarction involving right coronary artery: Secondary | ICD-10-CM

## 2016-06-01 NOTE — Progress Notes (Signed)
Daily Session Note  Patient Details  Name: April [name] MRN: [id number] Date of Birth: [birth year] Referring Provider:   Flowsheet Row Cardiac Rehab from 04/27/2016 in [facility] Cardiac and Pulmonary Rehab  Referring Provider  Ida Rogue MD      Encounter Date: 06/01/2016  Check In:     Session Check In - 06/01/16 1642      Check-In   Location [facility]-Cardiac & Pulmonary Rehab   Staff Present Earlean Shawl, BS, ACSM CEP, Exercise Physiologist;Carroll Enterkin, RN, Vickki Hearing, BA, ACSM CEP, Exercise Physiologist   Supervising physician immediately available to respond to emergencies See telemetry face sheet for immediately available ER MD   Medication changes reported     No   Fall or balance concerns reported    No   Warm-up and Cool-down Performed on first and last piece of equipment   Resistance Training Performed Yes   VAD Patient? No     Pain Assessment   Currently in Pain? No/denies   Multiple Pain Sites No         Goals Met:  Independence with exercise equipment Exercise tolerated well No report of cardiac concerns or symptoms Strength training completed today  Goals Unmet:  Not Applicable  Comments: Pt able to follow exercise prescription today without complaint.  Will continue to monitor for progression.    Dr. Emily Filbert is Medical Director for [facility] and [facility].

## 2016-06-05 ENCOUNTER — Encounter: Payer: Medicare Other | Admitting: *Deleted

## 2016-06-05 DIAGNOSIS — I213 ST elevation (STEMI) myocardial infarction of unspecified site: Secondary | ICD-10-CM | POA: Diagnosis not present

## 2016-06-05 DIAGNOSIS — I2111 ST elevation (STEMI) myocardial infarction involving right coronary artery: Secondary | ICD-10-CM

## 2016-06-05 DIAGNOSIS — Z955 Presence of coronary angioplasty implant and graft: Secondary | ICD-10-CM

## 2016-06-05 NOTE — Progress Notes (Signed)
Daily Session Note  Patient Details  Name: DAGNY FIORENTINO MRN: [id number] Date of Birth: 1940/12/30 Referring Provider:   Flowsheet Row Cardiac Rehab from 04/27/2016 in [facility] Cardiac and Pulmonary Rehab  Referring Provider  Ida Rogue MD      Encounter Date: 06/05/2016  Check In:     Session Check In - 06/05/16 1750      Check-In   Location [facility]-Cardiac & Pulmonary Rehab   Staff Present Gerlene Burdock, RN, Moises Blood, BS, ACSM CEP, Exercise Physiologist;Mary Kellie Shropshire, RN, BSN, MA   Supervising physician immediately available to respond to emergencies See telemetry face sheet for immediately available ER MD   Medication changes reported     No   Fall or balance concerns reported    No   Warm-up and Cool-down Performed on first and last piece of equipment   Resistance Training Performed Yes   VAD Patient? No     Pain Assessment   Currently in Pain? No/denies   Multiple Pain Sites No         Goals Met:  Independence with exercise equipment Exercise tolerated well No report of cardiac concerns or symptoms Strength training completed today  Goals Unmet:  Not Applicable  Comments: Pt able to follow exercise prescription today without complaint.  Will continue to monitor for progression.    Dr. Emily Filbert is Medical Director for [place] [name] and [facility].

## 2016-06-07 ENCOUNTER — Encounter: Payer: Self-pay | Admitting: Dietician

## 2016-06-07 ENCOUNTER — Encounter: Payer: Medicare Other | Admitting: *Deleted

## 2016-06-07 DIAGNOSIS — I213 ST elevation (STEMI) myocardial infarction of unspecified site: Secondary | ICD-10-CM | POA: Diagnosis not present

## 2016-06-07 DIAGNOSIS — I2111 ST elevation (STEMI) myocardial infarction involving right coronary artery: Secondary | ICD-10-CM

## 2016-06-07 DIAGNOSIS — Z955 Presence of coronary angioplasty implant and graft: Secondary | ICD-10-CM

## 2016-06-07 NOTE — Progress Notes (Signed)
Daily Session Note  Patient Details  Name: April [name] MRN: [id number] Date of Birth: [birth year] Referring Provider:   Flowsheet Row Cardiac Rehab from 04/27/2016 in [facility] Cardiac and Pulmonary Rehab  Referring Provider  Ida Rogue MD      Encounter Date: 06/07/2016  Check In:     Session Check In - 06/07/16 1617      Check-In   Location [facility]-Cardiac & Pulmonary Re[facility]   Staff Present Gerlene Burdock, RN, Vickki Hearing, BA, ACSM CEP, Exercise Physiologist;Patricia Surles RN BSN   Supervising physician immediately available to respond to emergencies See telemetry face sheet for immediately available ER MD   Medication changes reported     No   Fall or balance concerns reported    Yes   Warm-up and Cool-down Performed on first and last piece of equipment   Resistance Training Performed Yes   VAD Patient? No     Pain Assessment   Currently in Pain? No/denies         Goals Met:  Proper associated with RPD/PD & O2 Sat Exercise tolerated well No report of cardiac concerns or symptoms  Goals Unmet:  Not Applicable  Comments:     Dr. Emily Filbert is Medical Director for [place] and [facility].

## 2016-06-07 NOTE — Progress Notes (Signed)
Daily Session Note  Patient Details  Name: April [name] MRN: [id number] Date of Birth: 09/27/1940 Referring Provider:   Flowsheet Row Cardiac Rehab from 04/27/2016 in [facility] Cardiac and Pulmonary Rehab  Referring Provider  Gollan, Timothy MD      Encounter Date: 06/07/2016  Check In:     Session Check In - 06/07/16 1617      Check-In   Location [facility]-Cardiac & Pulmonary Rehab   Staff Present Carroll Enterkin, RN, BSN;Amanda Sommer, BA, ACSM CEP, Exercise Physiologist;Patricia Surles RN BSN   Supervising physician immediately available to respond to emergencies See telemetry face sheet for immediately available ER MD   Medication changes reported     No   Fall or balance concerns reported    Yes   Warm-up and Cool-down Performed on first and last piece of equipment   Resistance Training Performed Yes   VAD Patient? No     Pain Assessment   Currently in Pain? No/denies         Goals Met:  Proper associated with RPD/PD & O2 Sat Exercise tolerated well  Goals Unmet:  Not Applicable  Comments:     Dr. Mark Miller is Medical Director for [facility] and [facility]. 

## 2016-06-07 NOTE — Progress Notes (Signed)
Cardiac Individual Treatment Plan  Patient Details  Name: April [name] MRN: [id number] Date of Birth: 05-01-1941 Referring Provider:   Flowsheet Row Cardiac Rehab from 04/27/2016 in [facility] Cardiac and Pulmonary Rehab  Referring Provider  Ida Rogue MD      Initial Encounter Date:  Flowsheet Row Cardiac Rehab from 04/27/2016 in [facility] Cardiac and Pulmonary Rehab  Date  04/27/16  Referring Provider  Ida Rogue MD      Visit Diagnosis: ST elevation myocardial infarction involving right coronary artery [facility])  Status post coronary artery stent placement  Patient's Home Medications on Admission:  Current Outpatient Prescriptions:  .  acetaminophen (TYLENOL) 500 MG tablet, Take 500 mg by mouth daily as needed for headache (pain)., Disp: , Rfl:  .  albuterol (PROVENTIL HFA;VENTOLIN HFA) 108 (90 Base) MCG/ACT inhaler, Inhale 2 puffs into the lungs every 4 (four) hours as needed for wheezing or shortness of breath. (Patient not taking: Reported on 04/27/2016), Disp: 1 Inhaler, Rfl: 6 .  amiodarone (PACERONE) 200 MG tablet, Take 1 tablet (200 mg total) by mouth daily., Disp: 30 tablet, Rfl: 6 .  amLODipine (NORVASC) 2.5 MG tablet, Take 1 tablet (2.5 mg total) by mouth daily., Disp: 30 tablet, Rfl: 6 .  apixaban (ELIQUIS) 5 MG TABS tablet, Take 1 tablet (5 mg total) by mouth 2 (two) times daily., Disp: 60 tablet, Rfl: 6 .  calcium carbonate (TUMS - DOSED IN MG ELEMENTAL CALCIUM) 500 MG chewable tablet, Chew 1-3 tablets by mouth daily as needed for indigestion or heartburn. , Disp: , Rfl:  .  cholecalciferol (VITAMIN D) 1000 units tablet, Take 1,000 Units by mouth 2 (two) times daily., Disp: , Rfl:  .  clopidogrel (PLAVIX) 75 MG tablet, Take 1 tablet (75 mg total) by mouth daily., Disp: 30 tablet, Rfl: 6 .  fish oil-omega-3 fatty acids 1000 MG capsule, Take 1 g by mouth 2 (two) times daily. , Disp: , Rfl:  .  fluticasone (FLONASE) 50 MCG/ACT nasal spray, Place 1-2 sprays into both  nostrils at bedtime. , Disp: , Rfl:  .  fluticasone furoate-vilanterol (BREO ELLIPTA) 100-25 MCG/INH AEPB, Inhale 1 puff into the lungs daily. (Patient taking differently: Inhale 1 puff into the lungs at bedtime. ), Disp: 60 each, Rfl: 5 .  furosemide (LASIX) 20 MG tablet, Take 1 tablet (20 mg total) by mouth daily., Disp: 30 tablet, Rfl: 6 .  losartan-hydrochlorothiazide (HYZAAR) 100-12.5 MG tablet, TAKE ONE (1) TABLET EACH DAY, Disp: 30 tablet, Rfl: 3 .  meloxicam (MOBIC) 15 MG tablet, Take 15 mg by mouth daily as needed for pain. , Disp: , Rfl:  .  Menthol, Topical Analgesic, (BIOFREEZE EX), Apply 1 application topically daily as needed (pain)., Disp: , Rfl:  .  metoprolol (LOPRESSOR) 100 MG tablet, Take 1 tablet (100 mg total) by mouth 2 (two) times daily., Disp: 60 tablet, Rfl: 6 .  Misc Natural Products (OSTEO BI-FLEX JOINT SHIELD PO), Take 1 tablet by mouth 2 (two) times daily. , Disp: , Rfl:  .  Multiple Vitamin (MULTIVITAMIN WITH MINERALS) TABS tablet, Take 0.5 tablets by mouth 2 (two) times daily., Disp: , Rfl:  .  nitroGLYCERIN (NITROSTAT) 0.4 MG SL tablet, Place 1 tablet (0.4 mg total) under the tongue every 5 (five) minutes as needed., Disp: 25 tablet, Rfl: 3 .  pantoprazole (PROTONIX) 40 MG tablet, Take 1 tablet (40 mg total) by mouth daily., Disp: 30 tablet, Rfl: 6 .  Polyethyl Glycol-Propyl Glycol (SYSTANE OP), Place 1 drop into both eyes  daily as needed (dry eyes)., Disp: , Rfl:  .  potassium chloride (K-DUR) 10 MEQ tablet, Take 1 tablet (10 mEq total) by mouth daily as needed. (Patient taking differently: Take 10 mEq by mouth daily as needed (with furosemide (lasix) dose). ), Disp: 30 tablet, Rfl: 11 .  PRESCRIPTION MEDICATION, Inhale into the lungs at bedtime. CPAP, Disp: , Rfl:  .  rosuvastatin (CRESTOR) 40 MG tablet, Take 1 tablet (40 mg total) by mouth daily., Disp: 30 tablet, Rfl: 6 .  traMADol (ULTRAM) 50 MG tablet, Take 50 mg by mouth daily as needed (pain)., Disp: , Rfl:    Past Medical History: Past Medical History:  Diagnosis Date  . Arrhythmia    history of an irregular heart beat  . CAD S/P PCI pRCA Promus DES 3.5 x 16 (4.1 mm), ostRPDA Promus DES 2.5 x 12 (2.7 mm) 03/25/2016  . Diverticulitis   . Endometriosis   . Heart murmur    no significant valvular lesion noted on Echo  . Hiatal hernia   . History of colon polyps 08/1994  . Hypercholesterolemia   . Hypertension   . Migraines    history of migraines  . Osteoporosis   . Rheumatic fever   . Sleep apnea    CPAP  . ST elevation myocardial infarction (STEMI) of inferolateral wall, initial episode of care ([place]) 03/25/2016  . Urine incontinence     Tobacco Use: History  Smoking Status  . Never Smoker  Smokeless Tobacco  . Never Used    Labs: Recent Review Flowsheet Data    Labs for ITP Cardiac and Pulmonary Rehab Latest Ref Rng & Units 06/17/2015 10/01/2015 12/23/2015 03/25/2016 03/26/2016   Cholestrol 0 - 200 mg/dL 195 180 142 166 133   LDLCALC 0 - 99 mg/dL 114(H) 101(H) 66 75 63   HDL >40 mg/dL 65.40 63.80 61.00 72 53   Trlycerides <150 mg/dL 79.0 77.0 78.0 96 87   Hemoglobin A1c 4.8 - 5.6 % 6.5 6.1 6.2 - 6.2(H)   TCO2 0 - 100 mmol/L - - - 25 -       Exercise Target Goals:    Exercise Program Goal: Individual exercise prescription set with THRR, safety & activity barriers. Participant demonstrates ability to understand and report RPE using BORG scale, to self-measure pulse accurately, and to acknowledge the importance of the exercise prescription.  Exercise Prescription Goal: Starting with aerobic activity 30 plus minutes a day, 3 days per week for initial exercise prescription. Provide home exercise prescription and guidelines that participant acknowledges understanding prior to discharge.  Activity Barriers & Risk Stratification:     Activity Barriers & Cardiac Risk Stratification - 04/27/16 1514      Activity Barriers & Cardiac Risk Stratification   Activity Barriers  Arthritis;Back Problems;Joint Problems;Muscular Weakness;Shortness of Breath;Deconditioning;Balance Concerns;History of Falls  r knee pops, uses knee brace; limited ROM overhead   Cardiac Risk Stratification High      6 Minute Walk:     6 Minute Walk    Row Name 04/27/16 1513         6 Minute Walk   Phase Initial     Distance 1255 feet     Walk Time 6 minutes     # of Rest Breaks 0     MPH 2.37     METS 2.76     RPE 13     Perceived Dyspnea  4     VO2 Peak 6.62     Symptoms Yes (comment)  Comments SOB, knee pain 3/10     Resting HR 58 bpm     Resting BP 126/74     Max Ex. HR 88 bpm     Max Ex. BP 128/74     2 Minute Post BP 122/66        Initial Exercise Prescription:     Initial Exercise Prescription - 04/27/16 1500      Date of Initial Exercise RX and Referring Provider   Date 04/27/16   Referring Provider Ida Rogue MD     NuStep   Level 1   Watts --  60-80 spm   Minutes 15   METs 2     REL-XR   Level 1   Watts --  speed 50   Minutes 15   METs 2     Track   Laps 20   Minutes 15   METs 1.92     Prescription Details   Frequency (times per week) 3   Duration Progress to 45 minutes of aerobic exercise without signs/symptoms of physical distress     Intensity   THRR 40-80% of Max Heartrate 92-126   Ratings of Perceived Exertion 11-15     Progression   Progression Continue to progress workloads to maintain intensity without signs/symptoms of physical distress.     Resistance Training   Training Prescription Yes   Weight 2 lbs   Reps 10-12      Perform Capillary Blood Glucose checks as needed.  Exercise Prescription Changes:     Exercise Prescription Changes    Row Name 04/27/16 1500 05/11/16 1100 05/26/16 1100         Exercise Review   Progression -  Walk Test Results  -  -       Response to Exercise   Blood Pressure (Admit) 126/74 106/62 118/54     Blood Pressure (Exercise) 128/74 132/60 124/54     Blood Pressure  (Exit) 122/66 124/78 98/48     Heart Rate (Admit) 558 bpm 76 bpm 59 bpm     Heart Rate (Exercise) 88 bpm 94 bpm 94 bpm     Heart Rate (Exit) 57 bpm 73 bpm 66 bpm     Oxygen Saturation (Admit) 100 %  -  -     Oxygen Saturation (Exercise) 100 %  -  -     Rating of Perceived Exertion (Exercise) _0 Perceived Dyspnea (Exercise) 4  -  -     Symptoms SOB, knee pain 3/10  -  -     Duration  - Progress to 45 minutes of aerobic exercise without signs/symptoms of physical distress Progress to 45 minutes of aerobic exercise without signs/symptoms of physical distress     Intensity  - THRR unchanged THRR unchanged       Progression   Progression  - Continue to progress workloads to maintain intensity without signs/symptoms of physical distress. Continue to progress workloads to maintain intensity without signs/symptoms of physical distress.       Resistance Training   Training Prescription  - Yes Yes     Weight  - 2 2     Reps  - 10-12 10-12       Interval Training   Interval Training  - No No       REL-XR   Level  -  - 1     Minutes  -  - 15     METs  -  -  4.5       Biostep-RELP   Level  - 1 1     Minutes  - 15 15       Track   Minutes  - 15  -        Exercise Comments:     Exercise Comments    Row Name 04/27/16 1530 05/08/16 1810 05/11/16 1129 05/26/16 1201     Exercise Comments April [name] wants to build her endurance to be able to make the bed (short) and walk without SOB. First full day of exercise!  Patient was oriented to gym and equipment including functions, settings, policies, and procedures.  Patient's individual exercise prescription and treatment plan were reviewed.  All starting workloads were established based on the results of the 6 minute walk test done at initial orientation visit.  The plan for exercise progression was also introduced and progression will be customized based on patient's performance and goals. April [name] has done well in her first week of exercise.  April [name] is tolerating exercise very well.       Discharge Exercise Prescription (Final Exercise Prescription Changes):     Exercise Prescription Changes - 05/26/16 1100      Response to Exercise   Blood Pressure (Admit) 118/54   Blood Pressure (Exercise) 124/54   Blood Pressure (Exit) 98/48   Heart Rate (Admit) 59 bpm   Heart Rate (Exercise) 94 bpm   Heart Rate (Exit) 66 bpm   Rating of Perceived Exertion (Exercise) 14   Duration Progress to 45 minutes of aerobic exercise without signs/symptoms of physical distress   Intensity THRR unchanged     Progression   Progression Continue to progress workloads to maintain intensity without signs/symptoms of physical distress.     Resistance Training   Training Prescription Yes   Weight 2   Reps 10-12     Interval Training   Interval Training No     REL-XR   Level 1   Minutes 15   METs 4.5     Biostep-RELP   Level 1   Minutes 15      Nutrition:  Target Goals: Understanding of nutrition guidelines, daily intake of sodium '1500mg'$ , cholesterol '200mg'$ , calories 30% from fat and 7% or less from saturated fats, daily to have 5 or more servings of fruits and vegetables.  Biometrics:     Pre Biometrics - 04/27/16 1532      Pre Biometrics   Height 5' 0.9" (1.547 m)   Weight 176 lb 9.6 oz (80.1 kg)   Waist Circumference 36 inches   Hip Circumference 46 inches   Waist to Hip Ratio 0.78 %   BMI (Calculated) 33.5   Single Leg Stand 1.73 seconds       Nutrition Therapy Plan and Nutrition Goals:     Nutrition Therapy & Goals - 06/07/16 1544      Nutrition Therapy   Diet basic heart healthy diet     Personal Nutrition Goals   Personal Goal #1 continue to eat balanced meals 3 times a day, and make sure to include a lean protein food with your meals.    Comments April [name] is eating lunch and supper meals prepared in dining hall at [employer] at [place]. She is focusing on foods marked heart healthy, and avoiding fried  foods. She includes vegetables and fruits regularly.      Intervention Plan   Intervention Prescribe, educate and counsel regarding individualized specific dietary modifications aiming towards targeted core components such as  weight, hypertension, lipid management, diabetes, heart failure and other comorbidities.;Nutrition handout(s) given to patient.   Expected Outcomes Short Term Goal: Understand basic principles of dietary content, such as calories, fat, sodium, cholesterol and nutrients.;Long Term Goal: Adherence to prescribed nutrition plan.      Nutrition Discharge: Rate Your Plate Scores:     Nutrition Assessments - 04/27/16 1447      Rate Your Plate Scores   Pre Score 64   Pre Score % 73 %      Nutrition Goals Re-Evaluation:     Nutrition Goals Re-Evaluation    Row Name 05/24/16 1909 06/07/16 1619           Personal Goal #1 Re-Evaluation   Goal Progress Seen Yes Yes      Comments April [name] said at [facility] they have hearts on the healthy meal items. Like today she got the chicken wrap with veggies.  To meet with Dietician next week. April [name] said she has cut back on her portions. and she eats vegetables. April [name] said she is going to take the 2 weeks meal list that [facility] gives her and ask them to mark the healthy food although she said she knows bascially which foods are healthy.         Psychosocial: Target Goals: Acknowledge presence or absence of depression, maximize coping skills, provide positive support system. Participant is able to verbalize types and ability to use techniques and skills needed for reducing stress and depression.  Initial Review & Psychosocial Screening:     Initial Psych Review & Screening - 04/27/16 1504      Initial Review   Current issues with --  pt voiced no concerns     Family Dynamics   Good Support System? Yes   Comments No psychosocial concerns were identified during medical review with April [name]      Barriers   Psychosocial barriers to participate in program There are no identifiable barriers or psychosocial needs.     Screening Interventions   Interventions Encouraged to exercise      Quality of Life Scores:     Quality of Life - 04/27/16 1538      Quality of Life Scores   Health/Function Pre 24.46 %   Socioeconomic Pre 30 %   Psych/Spiritual Pre 28.29 %   Family Pre 30 %   GLOBAL Pre 26.89 %      PHQ-9: Recent Review Flowsheet Data    Depression screen [facility] 2/9 04/27/2016 02/14/2016 05/14/2015 09/07/2014 02/11/2013   Decreased Interest 0 0 0 0 0   Down, Depressed, Hopeless 0 0 0 0 0   PHQ - 2 Score 0 0 0 0 0   Altered sleeping 0 - - - -   Tired, decreased energy 3 - - - -   Change in appetite 0 - - - -   Feeling bad or failure about yourself  0 - - - -   Trouble concentrating 0 - - - -   Moving slowly or fidgety/restless 0 - - - -   Suicidal thoughts 0 - - - -   PHQ-9 Score 3 - - - -   Difficult doing work/chores Somewhat difficult - - - -      Psychosocial Evaluation and Intervention:     Psychosocial Evaluation - 05/15/16 1744      Psychosocial Evaluation & Interventions   Interventions Encouraged to exercise with the program and follow exercise prescription   Comments Counselor met with April [name]  today for initial psychosocial evaluation.  She is a 76 year old who had a heart attack and two stents inserted late November.  She has a strong support system with a son who lives close by; active involvement in her local church, and lives in a retirement community with lots of friends who check in on her.  April [name] states she sleeps "okay" even with her CPAP and gets approximately 6-7 hours average of sleep per night.  She has a good appetite and denies a history or current symptoms of depression or anxiety.  Mr. Chauncey [name] states she is typically in a positive mood; although she became tearful when speaking of her daughter's death this past August 11, 2022.  Counselor encouraged a grief  share group to help her with the grief she is experiencing in a supportive environment.  April [name] has goals to lose weight and develop some muscle strength while in this program.  She plans to return to her retirement community gym upon completion of this program.  Staff will follow with April [name] over the course of this program.        Psychosocial Re-Evaluation:     Psychosocial Re-Evaluation    Row Name 05/11/16 1332 05/15/16 1752 05/24/16 1904 05/31/16 1714 06/07/16 1622     Psychosocial Re-Evaluation   Interventions  - Encouraged to attend Cardiac Rehabilitation for the exercise  -  - Encouraged to attend Cardiac Rehabilitation for the exercise   Comments April [name] called and said she is sorry that she can't attend Cardiac Rehab today since she is sick.  April [name] was given information card for the Heart Woman's support group. April [name] said she missed her daughter who was always checking on her. Her daughter had a stroke and then heart problems. April [name] said she was told about Grief Group at [facility] but she is trying to get moved in totally into [place].  Counselor follow up with Ms. Bale today stating she has close friends and family members telling her they've noticed her not struggling as much with shortness of breath when she talks.  She has noticed this some, but mentioned no change when she walks - still difficulty breathing then especially.  She see a Dr. in a few weeks for follow up and will be discussing with him her ongoing sleep issues as well - thinking it may be a negative side effect to one of the medications.  Her sleep continues to be interrupted and she has difficulty going back to sleep.  Counselor assessed Ms. Eilers's coping currently with the loss of her daughter less than a year ago, with her reporting her friends and family are checking in on her and taking her out to lunch or dinner quite often and this, along with just staing busy and trying "not to think" has been helpful to her  during this time.  Counselor commended Ms. Nevels for her progress and how well she is coping currently and will continue to follow with her throughout the course of this program.   April [name] is listing to the stress education lecture today given by Lucianne Lei, MSW.    Continued Psychosocial Services Needed  - Yes  -  -  -      Vocational Rehabilitation: Provide vocational rehab assistance to qualifying candidates.   Vocational Rehab Evaluation & Intervention:     Vocational Rehab - 04/27/16 1519      Initial Vocational Rehab Evaluation & Intervention   Assessment shows need for Vocational Rehabilitation No  Education: Education Goals: Education classes will be provided on a weekly basis, covering required topics. Participant will state understanding/return demonstration of topics presented.  Learning Barriers/Preferences:     Learning Barriers/Preferences - 04/27/16 1518      Learning Barriers/Preferences   Learning Barriers None   Learning Preferences None      Education Topics: General Nutrition Guidelines/Fats and Fiber: -Group instruction provided by verbal, written material, models and posters to present the general guidelines for heart healthy nutrition. Gives an explanation and review of dietary fats and fiber. Flowsheet Row Cardiac Rehab from 06/07/2016 in [facility] Cardiac and Pulmonary Rehab  Date  05/08/16  Educator  PI  Instruction Review Code  2- meets goals/outcomes      Controlling Sodium/Reading Food Labels: -Group verbal and written material supporting the discussion of sodium use in heart healthy nutrition. Review and explanation with models, verbal and written materials for utilization of the food label. Flowsheet Row Cardiac Rehab from 06/07/2016 in [facility] Cardiac and Pulmonary Rehab  Date  05/15/16  Educator  PI  Instruction Review Code  2- meets goals/outcomes      Exercise Physiology & Risk Factors: - Group verbal and written instruction with  models to review the exercise physiology of the cardiovascular system and associated critical values. Details cardiovascular disease risk factors and the goals associated with each risk factor. Flowsheet Row Cardiac Rehab from 06/07/2016 in [facility] Cardiac and Pulmonary Rehab  Date  05/22/16  Educator  [facility]  Instruction Review Code  2- meets goals/outcomes      Aerobic Exercise & Resistance Training: - Gives group verbal and written discussion on the health impact of inactivity. On the components of aerobic and resistive training programs and the benefits of this training and how to safely progress through these programs. Flowsheet Row Cardiac Rehab from 06/07/2016 in [facility] Cardiac and Pulmonary Rehab  Date  05/24/16  Educator  AS  Instruction Review Code  2- meets goals/outcomes      Flexibility, Balance, General Exercise Guidelines: - Provides group verbal and written instruction on the benefits of flexibility and balance training programs. Provides general exercise guidelines with specific guidelines to those with heart or lung disease. Demonstration and skill practice provided.   Stress Management: - Provides group verbal and written instruction about the health risks of elevated stress, cause of high stress, and healthy ways to reduce stress. Flowsheet Row Cardiac Rehab from 06/07/2016 in [facility] Cardiac and Pulmonary Rehab  Date  06/07/16  Educator  Lucianne Lei, MSW  Instruction Review Code  2- meets goals/outcomes      Depression: - Provides group verbal and written instruction on the correlation between heart/lung disease and depressed mood, treatment options, and the stigmas associated with seeking treatment. Flowsheet Row Cardiac Rehab from 06/07/2016 in [facility] Cardiac and Pulmonary Rehab  Date  05/10/16  Educator  Lucianne Lei, MSW  Instruction Review Code  2- meets goals/outcomes      Anatomy & Physiology of the Heart: - Group verbal and written instruction and models provide  basic cardiac anatomy and physiology, with the coronary electrical and arterial systems. Review of: AMI, Angina, Valve disease, Heart Failure, Cardiac Arrhythmia, Pacemakers, and the ICD. Flowsheet Row Cardiac Rehab from 06/07/2016 in [facility] Cardiac and Pulmonary Rehab  Date  06/05/16  Educator  CE  Instruction Review Code  2- meets goals/outcomes      Cardiac Procedures: - Group verbal and written instruction and models to describe the testing methods done to diagnose heart disease. Reviews  the outcomes of the test results. Describes the treatment choices: Medical Management, Angioplasty, or Coronary Bypass Surgery.   Cardiac Medications: - Group verbal and written instruction to review commonly prescribed medications for heart disease. Reviews the medication, class of the drug, and side effects. Includes the steps to properly store meds and maintain the prescription regimen.   Go Sex-Intimacy & Heart Disease, Get SMART - Goal Setting: - Group verbal and written instruction through game format to discuss heart disease and the return to sexual intimacy. Provides group verbal and written material to discuss and apply goal setting through the application of the S.M.A.R.T. Method.   Other Matters of the Heart: - Provides group verbal, written materials and models to describe Heart Failure, Angina, Valve Disease, and Diabetes in the realm of heart disease. Includes description of the disease process and treatment options available to the cardiac patient. Flowsheet Row Cardiac Rehab from 06/07/2016 in [facility] Cardiac and Pulmonary Rehab  Date  06/05/16  Educator  CE  Instruction Review Code  2- meets goals/outcomes      Exercise & Equipment Safety: - Individual verbal instruction and demonstration of equipment use and safety with use of the equipment. Flowsheet Row Cardiac Rehab from 06/07/2016 in [facility] Cardiac and Pulmonary Rehab  Date  04/27/16  Educator  SB  Instruction Review Code  1- partially  meets, needs review/practice      Infection Prevention: - Provides verbal and written material to individual with discussion of infection control including proper hand washing and proper equipment cleaning during exercise session. Flowsheet Row Cardiac Rehab from 06/07/2016 in [facility] Cardiac and Pulmonary Rehab  Date  04/27/16  Educator  SB  Instruction Review Code  2- meets goals/outcomes      Falls Prevention: - Provides verbal and written material to individual with discussion of falls prevention and safety. Flowsheet Row Cardiac Rehab from 06/07/2016 in [facility] Cardiac and Pulmonary Rehab  Date  04/27/16  Educator  SB  Instruction Review Code  2- meets goals/outcomes      Diabetes: - Individual verbal and written instruction to review signs/symptoms of diabetes, desired ranges of glucose level fasting, after meals and with exercise. Advice that pre and post exercise glucose checks will be done for 3 sessions at entry of program.    Knowledge Questionnaire Score:     Knowledge Questionnaire Score - 04/27/16 1518      Knowledge Questionnaire Score   Pre Score 21/28      Core Components/Risk Factors/Patient Goals at Admission:     Personal Goals and Risk Factors at Admission - 05/15/16 1741      Core Components/Risk Factors/Patient Goals on Admission    Weight Management Yes      Core Components/Risk Factors/Patient Goals Review:      Goals and Risk Factor Review    Row Name 05/15/16 1748 05/15/16 1750 05/15/16 1803 05/24/16 1906 05/24/16 1911     Core Components/Risk Factors/Patient Goals Review   Personal Goals Review Weight Management/Obesity  -  - Sedentary;Increase Strength and Stamina  -   Review April [name] admits to gaining weight since being in Cardiac REhab but she said she had her heart attack when she was suppose to move to [facility] so she just had them bring her bed and tv. April [name] said her daughter died a couple of years ago of heart problems and her husband  died 3 years ago.  April [name]'s blood pressure after exercising for 10-15 minutes on the recumbent elliptical was 140/80.  April [name] has  an appt with the Cardiac Rehab Registered Dietician next week. Anagha said she has gained weight since she has been here but has a lot of stressors going on.  Alizzon was able to walk around the gym today but had to stop after so many laps due to her hip/back hurt at times. Cynthis said she is still adjusting to [facility] retirment facility. Akeya said she is to see the Registered dietiican on Feb 7th and was suppse to see Dietican today but dietician is sick todya.  Riona said she has never smoked but around her 41 year old aunt who smokes and others. Her 77 year old aunt is a inspiration in the fact that she is still exercising twice a week and has exercised all her life.    Expected Outcomes  - Improve to have a heart healthy lifestyle.  Heart healthy lifestyle Heart healthy living.   -   Row Name 06/07/16 1621             Core Components/Risk Factors/Patient Goals Review   Review Today Temeka's weight is 277 and she has not lost weight. Arizona Constable has just met with the dietician. She is 12 years old she said.        Expected Outcomes Heart healither living          Core Components/Risk Factors/Patient Goals at Discharge (Final Review):      Goals and Risk Factor Review - 06/07/16 1621      Core Components/Risk Factors/Patient Goals Review   Review Today Arlynn's weight is 277 and she has not lost weight. Arizona Constable has just met with the dietician. She is 63 years old she said.    Expected Outcomes Heart healither living      ITP Comments:     ITP Comments    Row Name 04/27/16 1415 05/02/16 0655 05/11/16 1332 05/31/16 0652     ITP Comments Medical review was completed today.  Initial ITP created during medical review.  Diagnosis documented in Discharge note from 03/25/2016 in Epic. 30 day review. Continue with ITP unless directed changes per Medical  Director review. New to program Julicia called and said she is sorry that she can't attend Cardiac Rehab today since she is sick.  30 day review. Continue with ITP unless directed changes per Medical Director review. New to program       Comments:

## 2016-06-08 ENCOUNTER — Encounter: Payer: Medicare Other | Admitting: *Deleted

## 2016-06-08 DIAGNOSIS — I213 ST elevation (STEMI) myocardial infarction of unspecified site: Secondary | ICD-10-CM | POA: Diagnosis not present

## 2016-06-08 DIAGNOSIS — Z955 Presence of coronary angioplasty implant and graft: Secondary | ICD-10-CM

## 2016-06-08 DIAGNOSIS — I2111 ST elevation (STEMI) myocardial infarction involving right coronary artery: Secondary | ICD-10-CM

## 2016-06-08 NOTE — Progress Notes (Signed)
Daily Session Note  Patient Details  Name: April [name] MRN: [id number] Date of Birth: May 25, 1940 Referring Provider:   Flowsheet Row Cardiac Rehab from 04/27/2016 in [facility] Cardiac and Pulmonary Rehab  Referring Provider  Ida Rogue MD      Encounter Date: 06/08/2016  Check In:     Session Check In - 06/08/16 1627      Check-In   Location [facility]-Cardiac & Pulmonary Rehab   Staff Present Nada Maclachlan, BA, ACSM CEP, Exercise Physiologist;Deriona Altemose Amedeo Plenty, BS, ACSM CEP, Exercise Physiologist;Patricia Surles RN BSN;Other   Supervising physician immediately available to respond to emergencies See telemetry face sheet for immediately available ER MD   Medication changes reported     No   Fall or balance concerns reported    No   Warm-up and Cool-down Performed on first and last piece of equipment   Resistance Training Performed Yes   VAD Patient? No     Pain Assessment   Currently in Pain? No/denies   Multiple Pain Sites No           Exercise Prescription Changes - 06/08/16 1600      Response to Exercise   Duration Progress to 45 minutes of aerobic exercise without signs/symptoms of physical distress   Intensity THRR unchanged     Progression   Progression Continue to progress workloads to maintain intensity without signs/symptoms of physical distress.     Resistance Training   Training Prescription Yes   Weight 2   Reps 10-12     Interval Training   Interval Training No     Biostep-RELP   Level 1   Minutes 15   METs 2     Track   Laps 45   Minutes 15     Home Exercise Plan   Plans to continue exercise at [facility] (comment)  will use wellness center in community    Frequency Add 2 additional days to program exercise sessions.      Goals Met:  Independence with exercise equipment Exercise tolerated well Personal goals reviewed No report of cardiac concerns or symptoms Strength training completed today  Goals Unmet:  Not  Applicable  Comments: Pt able to follow exercise prescription today without complaint.  Will continue to monitor for progression. Home exercise guidelines were reviewed with patinet. She plans to exercise at her wellness facility in her community 2 additional days outside of cardiac rehab class.    Dr. Emily Filbert is Medical Director for [place] and [facility].

## 2016-06-14 ENCOUNTER — Encounter: Payer: Medicare Other | Admitting: *Deleted

## 2016-06-14 DIAGNOSIS — I213 ST elevation (STEMI) myocardial infarction of unspecified site: Secondary | ICD-10-CM | POA: Diagnosis not present

## 2016-06-14 DIAGNOSIS — I2111 ST elevation (STEMI) myocardial infarction involving right coronary artery: Secondary | ICD-10-CM

## 2016-06-14 NOTE — Progress Notes (Signed)
Daily Session Note  Patient Details  Name: April [name] MRN: [id number] Date of Birth: 1941-03-02 Referring Provider:   Flowsheet Row Cardiac Rehab from 04/27/2016 in [facility] Cardiac and Pulmonary Rehab  Referring Provider  Ida Rogue MD      Encounter Date: 06/14/2016  Check In:     Session Check In - 06/14/16 1700      Check-In   Location [facility]-Cardiac & Pulmonary Rehab   Staff Present Gerlene Burdock, RN, Vickki Hearing, BA, ACSM CEP, Exercise Physiologist;Patricia Surles RN BSN   Supervising physician immediately available to respond to emergencies See telemetry face sheet for immediately available ER MD   Medication changes reported     No   Fall or balance concerns reported    No   Warm-up and Cool-down Performed on first and last piece of equipment   Resistance Training Performed Yes   VAD Patient? No     Pain Assessment   Currently in Pain? No/denies         Goals Met:  Proper associated with RPD/PD & O2 Sat Exercise tolerated well  Goals Unmet:  Not Applicable  Comments:     Dr. Emily Filbert is Medical Director for [place] and [facility].

## 2016-06-15 DIAGNOSIS — Z955 Presence of coronary angioplasty implant and graft: Secondary | ICD-10-CM

## 2016-06-15 DIAGNOSIS — I2111 ST elevation (STEMI) myocardial infarction involving right coronary artery: Secondary | ICD-10-CM

## 2016-06-15 DIAGNOSIS — I213 ST elevation (STEMI) myocardial infarction of unspecified site: Secondary | ICD-10-CM | POA: Diagnosis not present

## 2016-06-15 NOTE — Progress Notes (Signed)
Daily Session Note  Patient Details  Name: April [name] MRN: [id number] Date of Birth: April 13, 1941 Referring Provider:   Flowsheet Row Cardiac Rehab from 04/27/2016 in [facility] Cardiac and Pulmonary Rehab  Referring Provider  Ida Rogue MD      Encounter Date: 06/15/2016  Check In:     Session Check In - 06/15/16 1645      Check-In   Location [facility]-Cardiac & Pulmonary Rehab   Staff Present Levell July RN Moises Blood, BS, ACSM CEP, Exercise Physiologist;Jonnie Truxillo Oletta Darter, [place], ACSM CEP, Exercise Physiologist   Supervising physician immediately available to respond to emergencies See telemetry face sheet for immediately available ER MD   Medication changes reported     No   Fall or balance concerns reported    No   Warm-up and Cool-down Performed on first and last piece of equipment   Resistance Training Performed Yes   VAD Patient? No     Pain Assessment   Currently in Pain? No/denies   Multiple Pain Sites No         Goals Met:  Independence with exercise equipment Exercise tolerated well No report of cardiac concerns or symptoms Strength training completed today  Goals Unmet:  Not Applicable  Comments: Pt able to follow exercise prescription today without complaint.  Will continue to monitor for progression.    Dr. Emily Filbert is Medical Director for [place] and [facility].

## 2016-06-19 ENCOUNTER — Encounter: Payer: Self-pay | Admitting: Internal Medicine

## 2016-06-19 ENCOUNTER — Ambulatory Visit (INDEPENDENT_AMBULATORY_CARE_PROVIDER_SITE_OTHER): Payer: Medicare Other | Admitting: Internal Medicine

## 2016-06-19 ENCOUNTER — Encounter: Payer: Medicare Other | Admitting: *Deleted

## 2016-06-19 DIAGNOSIS — E7800 Pure hypercholesterolemia, unspecified: Secondary | ICD-10-CM | POA: Diagnosis not present

## 2016-06-19 DIAGNOSIS — E1159 Type 2 diabetes mellitus with other circulatory complications: Secondary | ICD-10-CM | POA: Diagnosis not present

## 2016-06-19 DIAGNOSIS — G4733 Obstructive sleep apnea (adult) (pediatric): Secondary | ICD-10-CM | POA: Diagnosis not present

## 2016-06-19 DIAGNOSIS — F439 Reaction to severe stress, unspecified: Secondary | ICD-10-CM | POA: Diagnosis not present

## 2016-06-19 DIAGNOSIS — I25110 Atherosclerotic heart disease of native coronary artery with unstable angina pectoris: Secondary | ICD-10-CM | POA: Diagnosis not present

## 2016-06-19 DIAGNOSIS — E669 Obesity, unspecified: Secondary | ICD-10-CM

## 2016-06-19 DIAGNOSIS — I10 Essential (primary) hypertension: Secondary | ICD-10-CM | POA: Diagnosis not present

## 2016-06-19 DIAGNOSIS — K219 Gastro-esophageal reflux disease without esophagitis: Secondary | ICD-10-CM

## 2016-06-19 DIAGNOSIS — Z955 Presence of coronary angioplasty implant and graft: Secondary | ICD-10-CM

## 2016-06-19 DIAGNOSIS — I213 ST elevation (STEMI) myocardial infarction of unspecified site: Secondary | ICD-10-CM | POA: Diagnosis not present

## 2016-06-19 DIAGNOSIS — I2111 ST elevation (STEMI) myocardial infarction involving right coronary artery: Secondary | ICD-10-CM

## 2016-06-19 NOTE — Progress Notes (Signed)
Pre-visit discussion using our clinic review tool. No additional management support is needed unless otherwise documented below in the visit note.  

## 2016-06-19 NOTE — Progress Notes (Signed)
Daily Session Note  Patient Details  Name: April [name] MRN: [id number] Date of Birth: 10/13/1940 Referring Provider:   Flowsheet Row Cardiac Rehab from 04/27/2016 in [facility] Cardiac and Pulmonary Rehab  Referring Provider  Ida Rogue MD      Encounter Date: 06/19/2016  Check In:     Session Check In - 06/19/16 1621      Check-In   Location [facility]-Cardiac & Pulmonary Rehab   Staff Present Gerlene Burdock, RN, Moises Blood, BS, ACSM CEP, Exercise Physiologist;Srinivas Lippman, RN, BSN, CCRP   Supervising physician immediately available to respond to emergencies See telemetry face sheet for immediately available ER MD   Medication changes reported     No   Fall or balance concerns reported    No   Warm-up and Cool-down Performed on first and last piece of equipment   Resistance Training Performed Yes   VAD Patient? No     Pain Assessment   Currently in Pain? No/denies   Multiple Pain Sites No         Goals Met:  Exercise tolerated well No report of cardiac concerns or symptoms Strength training completed today  Goals Unmet:  Not Applicable  Comments: Doing well with exercise prescription progression.    Dr. Emily Filbert is Medical Director for [place] and [facility].

## 2016-06-19 NOTE — Progress Notes (Signed)
Patient ID: April [name], female   DOB: [birth year], 76 y.o.   MRN: [id number]   Subjective:    Patient ID: April [name], female    DOB: 1941-01-14, 76 y.o.   MRN: [id number]  HPI  Patient here for a scheduled follow up.  She reports she is doing relatively well.  S/p STEMI.  Going to heart track.  Due to f/u with Dr Rockey Situ 07/04/16.  Trying to stay active.  Recently moved.  Breathing stable.  No acid reflux.  No abdominal pain or cramping.  Bowels stable.  No urine change.     Past Medical History:  Diagnosis Date  . Arrhythmia    history of an irregular heart beat  . CAD S/P PCI pRCA Promus DES 3.5 x 16 (4.1 mm), ostRPDA Promus DES 2.5 x 12 (2.7 mm) 03/25/2016  . Diverticulitis   . Endometriosis   . Heart murmur    no significant valvular lesion noted on Echo  . Hiatal hernia   . History of colon polyps 08/1994  . Hypercholesterolemia   . Hypertension   . Migraines    history of migraines  . Osteoporosis   . Rheumatic fever   . Sleep apnea    CPAP  . ST elevation myocardial infarction (STEMI) of inferolateral wall, initial episode of care ([place]) 03/25/2016  . Urine incontinence    Past Surgical History:  Procedure Laterality Date  . ABDOMINAL HYSTERECTOMY     ovaries not removed  . APPENDECTOMY     was removed during hysterectomy  . Breast biopsies     x2  . BREAST BIOPSY Bilateral "years ago"  . BREAST SURGERY Bilateral   . CARDIAC CATHETERIZATION  2011   moderate 40% RCA disease  . CARDIAC CATHETERIZATION  01/2010   Dr. Gollan@[facility] : Only noted 40% RCA  . CARDIAC CATHETERIZATION N/A 03/25/2016   Procedure: Left Heart Cath and Coronary Angiography;  Surgeon: 04/07/2016, MD;  Location: [place] CV LAB;  Service: Cardiovascular;  Laterality: N/A;  . CARDIAC CATHETERIZATION N/A 03/25/2016   Procedure: Coronary Stent Intervention;  Surgeon: 04/07/2016, MD;  Location: [place] CV LAB;  Service: Cardiovascular;  Laterality: N/A;  . COLONOSCOPY  2013  .  ESOPHAGOGASTRODUODENOSCOPY (EGD) WITH PROPOFOL N/A 03/17/2015   Procedure: ESOPHAGOGASTRODUODENOSCOPY (EGD) WITH PROPOFOL;  Surgeon: 03/30/2015, MD;  Location: [facility] ENDOSCOPY;  Service: Gastroenterology;  Laterality: N/A;  . NM MYOVIEW ([place] [facility])  07/2015   No evidence ischemia or infarction. EF 66%. Low risk  . TONSILLECTOMY    . TRANSTHORACIC ECHOCARDIOGRAM  10/2013   Normal LV size and function. EF 55-65%. GR 1 DD. Otherwise normal.   Family History  Problem Relation Age of Onset  . Arthritis Mother   . Stroke Mother   . Hypertension Mother   . Osteoporosis Mother   . Heart disease Father     MI  . Heart attack Father   . Stroke Brother   . Breast cancer      first cousin x 2  . Colon cancer Neg Hx    Social History   Social History  . Marital status: Widowed    Spouse name: N/A  . Number of children: 2  . Years of education: N/A   Social History Main Topics  . Smoking status: Never Smoker  . Smokeless tobacco: Never Used  . Alcohol use No  . Drug use: No  . Sexual activity: Not Asked   Other Topics Concern  .  None   Social History Narrative  . None    Outpatient Encounter Prescriptions as of 06/19/2016  Medication Sig  . acetaminophen (TYLENOL) 500 MG tablet Take 500 mg by mouth daily as needed for headache (pain).  Marland Kitchen albuterol (PROVENTIL HFA;VENTOLIN HFA) 108 (90 Base) MCG/ACT inhaler Inhale 2 puffs into the lungs every 4 (four) hours as needed for wheezing or shortness of breath.  Marland Kitchen amiodarone (PACERONE) 200 MG tablet Take 1 tablet (200 mg total) by mouth daily.  Marland Kitchen amLODipine (NORVASC) 2.5 MG tablet Take 1 tablet (2.5 mg total) by mouth daily.  Marland Kitchen apixaban (ELIQUIS) 5 MG TABS tablet Take 1 tablet (5 mg total) by mouth 2 (two) times daily.  . calcium carbonate (TUMS - DOSED IN MG ELEMENTAL CALCIUM) 500 MG chewable tablet Chew 1-3 tablets by mouth daily as needed for indigestion or heartburn.   . cholecalciferol (VITAMIN D) 1000 units tablet Take 1,000  Units by mouth 2 (two) times daily.  . clopidogrel (PLAVIX) 75 MG tablet Take 1 tablet (75 mg total) by mouth daily.  . fish oil-omega-3 fatty acids 1000 MG capsule Take 1 g by mouth 2 (two) times daily.   . fluticasone (FLONASE) 50 MCG/ACT nasal spray Place 1-2 sprays into both nostrils at bedtime.   . fluticasone furoate-vilanterol (BREO ELLIPTA) 100-25 MCG/INH AEPB Inhale 1 puff into the lungs daily. (Patient taking differently: Inhale 1 puff into the lungs at bedtime. )  . furosemide (LASIX) 20 MG tablet Take 1 tablet (20 mg total) by mouth daily.  Marland Kitchen losartan-hydrochlorothiazide (HYZAAR) 100-12.5 MG tablet TAKE ONE (1) TABLET EACH DAY  . meloxicam (MOBIC) 15 MG tablet Take 15 mg by mouth daily as needed for pain.   . Menthol, Topical Analgesic, (BIOFREEZE EX) Apply 1 application topically daily as needed (pain).  . metoprolol (LOPRESSOR) 100 MG tablet Take 1 tablet (100 mg total) by mouth 2 (two) times daily.  . Misc Natural Products (OSTEO BI-FLEX JOINT SHIELD PO) Take 1 tablet by mouth 2 (two) times daily.   . Multiple Vitamin (MULTIVITAMIN WITH MINERALS) TABS tablet Take 0.5 tablets by mouth 2 (two) times daily.  . nitroGLYCERIN (NITROSTAT) 0.4 MG SL tablet Place 1 tablet (0.4 mg total) under the tongue every 5 (five) minutes as needed.  . pantoprazole (PROTONIX) 40 MG tablet Take 1 tablet (40 mg total) by mouth daily.  Vladimir Faster Glycol-Propyl Glycol (SYSTANE OP) Place 1 drop into both eyes daily as needed (dry eyes).  . potassium chloride (K-DUR) 10 MEQ tablet Take 1 tablet (10 mEq total) by mouth daily as needed. (Patient taking differently: Take 10 mEq by mouth daily as needed (with furosemide (lasix) dose). )  . PRESCRIPTION MEDICATION Inhale into the lungs at bedtime. CPAP  . ranitidine (ZANTAC) 75 MG tablet Take 75 mg by mouth 2 (two) times daily.  . rosuvastatin (CRESTOR) 40 MG tablet Take 1 tablet (40 mg total) by mouth daily.  . traMADol (ULTRAM) 50 MG tablet Take 50 mg by mouth  daily as needed (pain).   No facility-administered encounter medications on file as of 06/19/2016.     Review of Systems  Constitutional: Negative for appetite change and unexpected weight change.  HENT: Negative for congestion and sinus pressure.   Respiratory: Negative for cough, chest tightness and shortness of breath.   Cardiovascular: Negative for chest pain, palpitations and leg swelling.  Gastrointestinal: Negative for abdominal pain, diarrhea, nausea and vomiting.  Genitourinary: Negative for difficulty urinating and dysuria.  Musculoskeletal: Negative for joint swelling  and myalgias.  Skin: Negative for color change and rash.  Neurological: Negative for dizziness, light-headedness and headaches.  Psychiatric/Behavioral: Negative for agitation and dysphoric mood.       Objective:     Blood pressure rechecked by me:  120/62  Physical Exam  Constitutional: She appears well-developed and well-nourished. No distress.  HENT:  Nose: Nose normal.  Mouth/Throat: Oropharynx is clear and moist.  Neck: Neck supple. No thyromegaly present.  Cardiovascular: Normal rate and regular rhythm.   Pulmonary/Chest: Breath sounds normal. No respiratory distress. She has no wheezes.  Abdominal: Soft. Bowel sounds are normal. There is no tenderness.  Musculoskeletal: She exhibits no edema or tenderness.  Lymphadenopathy:    She has no cervical adenopathy.  Skin: No rash noted. No erythema.  Psychiatric: She has a normal mood and affect. Her behavior is normal.    BP 110/60 (BP Location: Left Arm, Patient Position: Sitting, Cuff Size: Large)   Pulse (!) 56   Temp 98.6 F (37 C) (Oral)   Resp 16   Ht 5' 1"  (1.549 m)   Wt 176 lb (79.8 kg)   LMP 05/01/1972   SpO2 95%   BMI 33.25 kg/m  Wt Readings from Last 3 Encounters:  06/19/16 176 lb (79.8 kg)  04/27/16 176 lb 9.6 oz (80.1 kg)  04/21/16 180 lb (81.6 kg)     Lab Results  Component Value Date   WBC 9.8 03/26/2016   HGB 11.2  (L) 03/26/2016   HCT 33.7 (L) 03/26/2016   PLT 209 03/26/2016   GLUCOSE 102 (H) 06/26/2016   CHOL 146 06/26/2016   TRIG 87.0 06/26/2016   HDL 68.10 06/26/2016   LDLCALC 60 06/26/2016   ALT 18 06/26/2016   AST 24 06/26/2016   NA 144 06/26/2016   K 3.9 06/26/2016   CL 101 06/26/2016   CREATININE 1.30 (H) 06/26/2016   BUN 27 (H) 06/26/2016   CO2 34 (H) 06/26/2016   TSH 1.60 06/26/2016   INR 0.99 03/25/2016   HGBA1C 6.5 06/26/2016   MICROALBUR 4.2 (H) 06/17/2015       Assessment & Plan:   Problem List Items Addressed This Visit    Coronary artery disease involving native coronary 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  Currently stable.  Going to heart track.  Due to f/u with cardiology 07/04/16.        Diabetes mellitus ([place])    Low carb diet and exercise.  Follow met b and a1c.   Lab Results  Component Value Date   HGBA1C 6.5 06/26/2016        Essential hypertension, benign (Chronic)    Blood pressure under good control.  Continue same medication regimen.  Follow pressures.  Follow metabolic panel.        GERD (gastroesophageal reflux disease)    Controlled on protonix.        Relevant Medications   ranitidine (ZANTAC) 75 MG tablet   Hypercholesterolemia (Chronic)    On crestor.  Lipid panel and liver function tests.   Lab Results  Component Value Date   CHOL 146 06/26/2016   HDL 68.10 06/26/2016   LDLCALC 60 06/26/2016   TRIG 87.0 06/26/2016   CHOLHDL 2 06/26/2016        Obesity (BMI 30-39.9) (Chronic)    Diet and exercise.        Obstructive sleep apnea (Chronic)    CPAP.       Stress  Increased stress as outlined previously. Still trying to deal with her daughter's death.  Discussed with her today.  Does not feel needs anything more at this time.  Follow.           Einar Pheasant, MD

## 2016-06-22 ENCOUNTER — Telehealth: Payer: Self-pay

## 2016-06-22 DIAGNOSIS — I213 ST elevation (STEMI) myocardial infarction of unspecified site: Secondary | ICD-10-CM | POA: Diagnosis not present

## 2016-06-22 DIAGNOSIS — I2111 ST elevation (STEMI) myocardial infarction involving right coronary artery: Secondary | ICD-10-CM

## 2016-06-22 DIAGNOSIS — Z955 Presence of coronary angioplasty implant and graft: Secondary | ICD-10-CM

## 2016-06-22 NOTE — Progress Notes (Signed)
Daily Session Note  Patient Details  Name: April [name] MRN: [id number] Date of Birth: [birth year] Referring Provider:   Flowsheet Row Cardiac Rehab from 04/27/2016 in [facility] Cardiac and Pulmonary Rehab  Referring Provider  Ida Rogue MD      Encounter Date: 06/22/2016  Check In:     Session Check In - 06/22/16 1719      Check-In   Location [facility]-Cardiac & Pulmonary Rehab   Staff Present Earlean Shawl, BS, ACSM CEP, Exercise Physiologist;Carroll Enterkin, RN, Vickki Hearing, BA, ACSM CEP, Exercise Physiologist   Supervising physician immediately available to respond to emergencies See telemetry face sheet for immediately available ER MD   Medication changes reported     No   Fall or balance concerns reported    No   Warm-up and Cool-down Performed on first and last piece of equipment   Resistance Training Performed Yes   VAD Patient? No     Pain Assessment   Currently in Pain? No/denies   Multiple Pain Sites No         Goals Met:  Independence with exercise equipment Exercise tolerated well No report of cardiac concerns or symptoms Strength training completed today  Goals Unmet:  Not Applicable  Comments: Pt able to follow exercise prescription today without complaint.  Will continue to monitor for progression.  See exercise comments.   Dr. Emily Filbert is Medical Director for [place] and [facility].

## 2016-06-22 NOTE — Telephone Encounter (Signed)
April [name] left a message that she was sick 06/21/16.

## 2016-06-26 ENCOUNTER — Other Ambulatory Visit (INDEPENDENT_AMBULATORY_CARE_PROVIDER_SITE_OTHER): Payer: Medicare Other

## 2016-06-26 ENCOUNTER — Encounter: Payer: Medicare Other | Admitting: *Deleted

## 2016-06-26 DIAGNOSIS — E1159 Type 2 diabetes mellitus with other circulatory complications: Secondary | ICD-10-CM | POA: Diagnosis not present

## 2016-06-26 DIAGNOSIS — Z955 Presence of coronary angioplasty implant and graft: Secondary | ICD-10-CM

## 2016-06-26 DIAGNOSIS — E7800 Pure hypercholesterolemia, unspecified: Secondary | ICD-10-CM

## 2016-06-26 DIAGNOSIS — I213 ST elevation (STEMI) myocardial infarction of unspecified site: Secondary | ICD-10-CM | POA: Diagnosis not present

## 2016-06-26 DIAGNOSIS — I2111 ST elevation (STEMI) myocardial infarction involving right coronary artery: Secondary | ICD-10-CM

## 2016-06-26 DIAGNOSIS — I10 Essential (primary) hypertension: Secondary | ICD-10-CM | POA: Diagnosis not present

## 2016-06-26 LAB — LIPID PANEL
Cholesterol: 146 mg/dL (ref 0–200)
HDL: 68.1 mg/dL (ref >39.00)
LDL Cholesterol: 60 mg/dL (ref 0–99)
NonHDL: 77.75
TRIGLYCERIDES: 87 mg/dL (ref 0.0–149.0)
Total CHOL/HDL Ratio: 2
VLDL: 17.4 mg/dL (ref 0.0–40.0)

## 2016-06-26 LAB — HEPATIC FUNCTION PANEL
ALBUMIN: 4.1 g/dL (ref 3.5–5.2)
ALT: 18 U/L (ref 0–35)
AST: 24 U/L (ref 0–37)
Alkaline Phosphatase: 51 U/L (ref 39–117)
Bilirubin, Direct: 0.1 mg/dL (ref 0.0–0.3)
TOTAL PROTEIN: 6.6 g/dL (ref 6.0–8.3)
Total Bilirubin: 0.5 mg/dL (ref 0.2–1.2)

## 2016-06-26 LAB — BASIC METABOLIC PANEL
BUN: 27 mg/dL — ABNORMAL HIGH (ref 6–23)
CALCIUM: 9.7 mg/dL (ref 8.4–10.5)
CO2: 34 meq/L — AB (ref 19–32)
CREATININE: 1.3 mg/dL — AB (ref 0.40–1.20)
Chloride: 101 mEq/L (ref 96–112)
GFR: 42.14 mL/min — AB (ref >60.00)
GLUCOSE: 102 mg/dL — AB (ref 70–99)
Potassium: 3.9 mEq/L (ref 3.5–5.1)
Sodium: 144 mEq/L (ref 135–145)

## 2016-06-26 LAB — HEMOGLOBIN A1C: HEMOGLOBIN A1C: 6.5 % (ref 4.6–6.5)

## 2016-06-26 LAB — TSH: TSH: 1.6 u[IU]/mL (ref 0.35–4.50)

## 2016-06-26 NOTE — Progress Notes (Signed)
Daily Session Note  Patient Details  Name: MARKETTA VALADEZ MRN: [id number] Date of Birth: 17-Mar-1941 Referring Provider:   Flowsheet Row Cardiac Rehab from 04/27/2016 in [facility] Cardiac and Pulmonary Rehab  Referring Provider  Ida Rogue MD      Encounter Date: 06/26/2016  Check In:     Session Check In - 06/26/16 1628      Check-In   Location [facility]-Cardiac & Pulmonary Rehab   Staff Present Gerlene Burdock, RN, BSN;Susanne Bice, RN, BSN, Laveda Norman, BS, ACSM CEP, Exercise Physiologist   Supervising physician immediately available to respond to emergencies See telemetry face sheet for immediately available ER MD   Medication changes reported     No   Fall or balance concerns reported    No   Warm-up and Cool-down Performed on first and last piece of equipment   Resistance Training Performed Yes   VAD Patient? No     Pain Assessment   Currently in Pain? No/denies   Multiple Pain Sites No         History  Smoking Status  . Never Smoker  Smokeless Tobacco  . Never Used    Goals Met:  Proper associated with RPD/PD & O2 Sat Exercise tolerated well  Goals Unmet:  Not Applicable  Comments:     Dr. Emily Filbert is Medical Director for [place] and [facility].

## 2016-06-27 ENCOUNTER — Other Ambulatory Visit: Payer: Self-pay | Admitting: Internal Medicine

## 2016-06-27 DIAGNOSIS — R7989 Other specified abnormal findings of blood chemistry: Secondary | ICD-10-CM

## 2016-06-27 NOTE — Progress Notes (Signed)
Order placed for f/u labs.  

## 2016-06-28 ENCOUNTER — Encounter: Payer: Self-pay | Admitting: *Deleted

## 2016-06-28 ENCOUNTER — Telehealth: Payer: Self-pay

## 2016-06-28 DIAGNOSIS — Z955 Presence of coronary angioplasty implant and graft: Secondary | ICD-10-CM

## 2016-06-28 DIAGNOSIS — I213 ST elevation (STEMI) myocardial infarction of unspecified site: Secondary | ICD-10-CM | POA: Diagnosis not present

## 2016-06-28 DIAGNOSIS — I2111 ST elevation (STEMI) myocardial infarction involving right coronary artery: Secondary | ICD-10-CM

## 2016-06-28 NOTE — Progress Notes (Signed)
Daily Session Note  Patient Details  Name: April [name] MRN: [id number] Date of Birth: 1940/06/15 Referring Provider:   Flowsheet Row Cardiac Rehab from 04/27/2016 in [facility] Cardiac and Pulmonary Rehab  Referring Provider  Ida Rogue MD      Encounter Date: 06/28/2016  Check In:     Session Check In - 06/28/16 1705      Check-In   Location [facility]-Cardiac & Pulmonary Rehab   Staff Present Levell July RN BSN;Carroll Enterkin, RN, Vickki Hearing, BA, ACSM CEP, Exercise Physiologist   Supervising physician immediately available to respond to emergencies See telemetry face sheet for immediately available ER MD   Medication changes reported     No   Fall or balance concerns reported    No   Tobacco Cessation No Change   Warm-up and Cool-down Performed on first and last piece of equipment   Resistance Training Performed Yes   VAD Patient? No         History  Smoking Status  . Never Smoker  Smokeless Tobacco  . Never Used    Goals Met:  Independence with exercise equipment Exercise tolerated well No report of cardiac concerns or symptoms Strength training completed today  Goals Unmet:  Not Applicable  Comments: Pt able to follow exercise prescription today without complaint.  Will continue to monitor for progression.    Dr. Emily Filbert is Medical Director for [place] and [facility].

## 2016-06-28 NOTE — Telephone Encounter (Signed)
-----   Message from Einar Pheasant, MD sent at 06/27/2016  5:31 AM EST ----- Please call and notify pt that her cholesterol looks good.  Overall sugar control ok.  Increased some when compared to previous check.  Low carb diet.  We will follow.  Her kidney function is slightly reduced.  Needs to stay hydrated.  Will need to follow.  Recheck kidney function tests within the next 10 days.  Avoid antiinflammatories.  If needs something for pain, use tylenol.  Thyroid test and liver function tests wnl.  Schedule non fasting lab in 10 days.

## 2016-06-28 NOTE — Telephone Encounter (Signed)
[email or web address]

## 2016-06-28 NOTE — Progress Notes (Signed)
Cardiac Individual Treatment Plan  Patient Details  Name: April [name] MRN: [id number] Date of Birth: 01-10-1941 Referring Provider:   Flowsheet Row Cardiac Rehab from 04/27/2016 in [facility] Cardiac and Pulmonary Rehab  Referring Provider  April Rogue MD      Initial Encounter Date:  Flowsheet Row Cardiac Rehab from 04/27/2016 in [name] Cardiac and Pulmonary Rehab  Date  04/27/16  Referring Provider  April Rogue MD      Visit Diagnosis: ST elevation myocardial infarction involving right coronary artery [name]  Status post coronary artery stent placement  Patient's Home Medications on Admission:  Current Outpatient Prescriptions:  .  acetaminophen (TYLENOL) 500 MG tablet, Take 500 mg by mouth daily as needed for headache (pain)., Disp: , Rfl:  .  albuterol (PROVENTIL HFA;VENTOLIN HFA) 108 (90 Base) MCG/ACT inhaler, Inhale 2 puffs into the lungs every 4 (four) hours as needed for wheezing or shortness of breath., Disp: 1 Inhaler, Rfl: 6 .  amiodarone (PACERONE) 200 MG tablet, Take 1 tablet (200 mg total) by mouth daily., Disp: 30 tablet, Rfl: 6 .  amLODipine (NORVASC) 2.5 MG tablet, Take 1 tablet (2.5 mg total) by mouth daily., Disp: 30 tablet, Rfl: 6 .  apixaban (ELIQUIS) 5 MG TABS tablet, Take 1 tablet (5 mg total) by mouth 2 (two) times daily., Disp: 60 tablet, Rfl: 6 .  calcium carbonate (TUMS - DOSED IN MG ELEMENTAL CALCIUM) 500 MG chewable tablet, Chew 1-3 tablets by mouth daily as needed for indigestion or heartburn. , Disp: , Rfl:  .  cholecalciferol (VITAMIN D) 1000 units tablet, Take 1,000 Units by mouth 2 (two) times daily., Disp: , Rfl:  .  clopidogrel (PLAVIX) 75 MG tablet, Take 1 tablet (75 mg total) by mouth daily., Disp: 30 tablet, Rfl: 6 .  fish oil-omega-3 fatty acids 1000 MG capsule, Take 1 g by mouth 2 (two) times daily. , Disp: , Rfl:  .  fluticasone (FLONASE) 50 MCG/ACT nasal spray, Place 1-2 sprays into both nostrils at bedtime. , Disp: , Rfl:  .   fluticasone furoate-vilanterol (BREO ELLIPTA) 100-25 MCG/INH AEPB, Inhale 1 puff into the lungs daily. (Patient taking differently: Inhale 1 puff into the lungs at bedtime. ), Disp: 60 each, Rfl: 5 .  furosemide (LASIX) 20 MG tablet, Take 1 tablet (20 mg total) by mouth daily., Disp: 30 tablet, Rfl: 6 .  losartan-hydrochlorothiazide (HYZAAR) 100-12.5 MG tablet, TAKE ONE (1) TABLET EACH DAY, Disp: 30 tablet, Rfl: 3 .  meloxicam (MOBIC) 15 MG tablet, Take 15 mg by mouth daily as needed for pain. , Disp: , Rfl:  .  Menthol, Topical Analgesic, (BIOFREEZE EX), Apply 1 application topically daily as needed (pain)., Disp: , Rfl:  .  metoprolol (LOPRESSOR) 100 MG tablet, Take 1 tablet (100 mg total) by mouth 2 (two) times daily., Disp: 60 tablet, Rfl: 6 .  Misc Natural Products (OSTEO BI-FLEX JOINT SHIELD PO), Take 1 tablet by mouth 2 (two) times daily. , Disp: , Rfl:  .  Multiple Vitamin (MULTIVITAMIN WITH MINERALS) TABS tablet, Take 0.5 tablets by mouth 2 (two) times daily., Disp: , Rfl:  .  nitroGLYCERIN (NITROSTAT) 0.4 MG SL tablet, Place 1 tablet (0.4 mg total) under the tongue every 5 (five) minutes as needed., Disp: 25 tablet, Rfl: 3 .  pantoprazole (PROTONIX) 40 MG tablet, Take 1 tablet (40 mg total) by mouth daily., Disp: 30 tablet, Rfl: 6 .  Polyethyl Glycol-Propyl Glycol (SYSTANE OP), Place 1 drop into both eyes daily as needed (dry eyes)., Disp: ,  Rfl:  .  potassium chloride (K-DUR) 10 MEQ tablet, Take 1 tablet (10 mEq total) by mouth daily as needed. (Patient taking differently: Take 10 mEq by mouth daily as needed (with furosemide (lasix) dose). ), Disp: 30 tablet, Rfl: 11 .  PRESCRIPTION MEDICATION, Inhale into the lungs at bedtime. CPAP, Disp: , Rfl:  .  ranitidine (ZANTAC) 75 MG tablet, Take 75 mg by mouth 2 (two) times daily., Disp: , Rfl:  .  rosuvastatin (CRESTOR) 40 MG tablet, Take 1 tablet (40 mg total) by mouth daily., Disp: 30 tablet, Rfl: 6 .  traMADol (ULTRAM) 50 MG tablet, Take 50  mg by mouth daily as needed (pain)., Disp: , Rfl:   Past Medical History: Past Medical History:  Diagnosis Date  . Arrhythmia    history of an irregular heart beat  . CAD S/P PCI pRCA Promus DES 3.5 x 16 (4.1 mm), ostRPDA Promus DES 2.5 x 12 (2.7 mm) 03/25/2016  . Diverticulitis   . Endometriosis   . Heart murmur    no significant valvular lesion noted on Echo  . Hiatal hernia   . History of colon polyps 08/1994  . Hypercholesterolemia   . Hypertension   . Migraines    history of migraines  . Osteoporosis   . Rheumatic fever   . Sleep apnea    CPAP  . ST elevation myocardial infarction (STEMI) of inferolateral wall, initial episode of care ([place]) 03/25/2016  . Urine incontinence     Tobacco Use: History  Smoking Status  . Never Smoker  Smokeless Tobacco  . Never Used    Labs: Recent Review Flowsheet Data    Labs for ITP Cardiac and Pulmonary Rehab Latest Ref Rng & Units 10/01/2015 12/23/2015 03/25/2016 03/26/2016 06/26/2016   Cholestrol 0 - 200 mg/dL 180 142 166 133 146   LDLCALC 0 - 99 mg/dL 101(H) 66 75 63 60   HDL >39.00 mg/dL 63.80 61.00 72 53 68.10   Trlycerides 0.0 - 149.0 mg/dL 77.0 78.0 96 87 87.0   Hemoglobin A1c 4.6 - 6.5 % 6.1 6.2 - 6.2(H) 6.5   TCO2 0 - 100 mmol/L - - 25 - -       Exercise Target Goals:    Exercise Program Goal: Individual exercise prescription set with THRR, safety & activity barriers. Participant demonstrates ability to understand and report RPE using BORG scale, to self-measure pulse accurately, and to acknowledge the importance of the exercise prescription.  Exercise Prescription Goal: Starting with aerobic activity 30 plus minutes a day, 3 days per week for initial exercise prescription. Provide home exercise prescription and guidelines that participant acknowledges understanding prior to discharge.  Activity Barriers & Risk Stratification:     Activity Barriers & Cardiac Risk Stratification - 04/27/16 1514      Activity  Barriers & Cardiac Risk Stratification   Activity Barriers Arthritis;Back Problems;Joint Problems;Muscular Weakness;Shortness of Breath;Deconditioning;Balance Concerns;History of Falls  r knee pops, uses knee brace; limited ROM overhead   Cardiac Risk Stratification High      6 Minute Walk:     6 Minute Walk    Row Name 04/27/16 1513         6 Minute Walk   Phase Initial     Distance 1255 feet     Walk Time 6 minutes     # of Rest Breaks 0     MPH 2.37     METS 2.76     RPE 13     Perceived Dyspnea  4     VO2 Peak 6.62     Symptoms Yes (comment)     Comments SOB, knee pain 3/10     Resting HR 58 bpm     Resting BP 126/74     Max Ex. HR 88 bpm     Max Ex. BP 128/74     2 Minute Post BP 122/66        Oxygen Initial Assessment:   Oxygen Re-Evaluation:   Oxygen Discharge (Final Oxygen Re-Evaluation):   Initial Exercise Prescription:     Initial Exercise Prescription - 04/27/16 1500      Date of Initial Exercise RX and Referring Provider   Date 04/27/16   Referring Provider April Rogue MD     NuStep   Level 1   SPM --  60-80 spm   Minutes 15   METs 2     REL-XR   Level 1   Watts --  speed 50   Minutes 15   METs 2     Track   Laps 20   Minutes 15   METs 1.92     Prescription Details   Frequency (times per week) 3   Duration Progress to 45 minutes of aerobic exercise without signs/symptoms of physical distress     Intensity   THRR 40-80% of Max Heartrate 92-126   Ratings of Perceived Exertion 11-15     Progression   Progression Continue to progress workloads to maintain intensity without signs/symptoms of physical distress.     Resistance Training   Training Prescription Yes   Weight 2 lbs   Reps 10-12      Perform Capillary Blood Glucose checks as needed.  Exercise Prescription Changes:     Exercise Prescription Changes    Row Name 04/27/16 1500 05/11/16 1100 05/26/16 1100 06/08/16 1100 06/08/16 1600     Response to  Exercise   Blood Pressure (Admit) 126/74 106/62 118/54 132/64  -   Blood Pressure (Exercise) 128/74 132/60 124/54 128/60  -   Blood Pressure (Exit) 122/66 124/78 98/48 108/58  -   Heart Rate (Admit) 558 bpm 76 bpm 59 bpm 61 bpm  -   Heart Rate (Exercise) 88 bpm 94 bpm 94 bpm 104 bpm  -   Heart Rate (Exit) 57 bpm 73 bpm 66 bpm 60 bpm  -   Oxygen Saturation (Admit) 100 %  -  -  -  -   Oxygen Saturation (Exercise) 100 %  -  -  -  -   Rating of Perceived Exertion (Exercise) 13 13 14 14   -   Perceived Dyspnea (Exercise) 4  -  -  -  -   Symptoms SOB, knee pain 3/10  -  -  -  -   Duration  - Progress to 45 minutes of aerobic exercise without signs/symptoms of physical distress Progress to 45 minutes of aerobic exercise without signs/symptoms of physical distress Progress to 45 minutes of aerobic exercise without signs/symptoms of physical distress Progress to 45 minutes of aerobic exercise without signs/symptoms of physical distress   Intensity  - THRR unchanged THRR unchanged THRR unchanged THRR unchanged     Progression   Progression  - Continue to progress workloads to maintain intensity without signs/symptoms of physical distress. Continue to progress workloads to maintain intensity without signs/symptoms of physical distress. Continue to progress workloads to maintain intensity without signs/symptoms of physical distress. Continue to progress workloads to maintain intensity without signs/symptoms of physical distress.  Resistance Training   Training Prescription  - Yes Yes Yes Yes   Weight  - 2 2 2 2    Reps  - 10-12 10-12 10-12 10-12     Interval Training   Interval Training  - No No No No     REL-XR   Level  -  - 1  -  -   Minutes  -  - 15  -  -   METs  -  - 4.5  -  -     Biostep-RELP   Level  - 1 1 1 1    Minutes  - 15 15 15 15    METs  -  -  - 2 2     Track   Laps  -  -  - 45 45   Minutes  - 15  - 15 15     Home Exercise Plan   Plans to continue exercise at  -  -  -  [employer] (comment)  will use [name] in community    Frequency  -  -  -  - Add 2 additional days to program exercise sessions.     Exercise Review   Progression -  Walk Test Results  -  -  -  -   Row Name 06/23/16 0900             Response to Exercise   Blood Pressure (Admit) 106/60       Blood Pressure (Exercise) 124/74       Blood Pressure (Exit) 92/60       Heart Rate (Admit) 60 bpm       Heart Rate (Exercise) 102 bpm       Heart Rate (Exit) 94 bpm       Rating of Perceived Exertion (Exercise) 13       Duration Progress to 45 minutes of aerobic exercise without signs/symptoms of physical distress       Intensity THRR unchanged         Progression   Progression Continue to progress workloads to maintain intensity without signs/symptoms of physical distress.         Resistance Training   Training Prescription Yes       Weight 2       Reps 10-12         Interval Training   Interval Training No         NuStep   Level 2       Minutes 15       METs 3.2         REL-XR   Level 2       Minutes 15       METs 8.8          Exercise Comments:     Exercise Comments    Row Name 04/27/16 1530 05/08/16 1810 05/11/16 1129 05/26/16 1201 06/08/16 1158   Exercise Comments April [name] wants to build her endurance to be able to make the bed (short) and walk without SOB. First full day of exercise!  Patient was oriented to gym and equipment including functions, settings, policies, and procedures.  Patient's individual exercise prescription and treatment plan were reviewed.  All starting workloads were established based on the results of the 6 minute walk test done at initial orientation visit.  The plan for exercise progression was also introduced and progression will be customized based on patient's performance and goals. April [name] has done well in her  first week of exercise. April [name] is tolerating exercise very well. April [name] is progressing well with exercise.   April [name] Name  06/08/16 1650 06/22/16 1720 06/23/16 0933       Exercise Comments Home exercise guidelines were reviewed with patinet. She plans to exercise at her wellness facility in her community 2 additional days outside of cardiac rehab class.  April [name] back was bothering her so she did the Nustep instead of walking today. April [name] will continue NS instead of walking  until her back feels better.        Exercise Goals and Review:   Exercise Goals Re-Evaluation :   Discharge Exercise Prescription (Final Exercise Prescription Changes):     Exercise Prescription Changes - 06/23/16 0900      Response to Exercise   Blood Pressure (Admit) 106/60   Blood Pressure (Exercise) 124/74   Blood Pressure (Exit) 92/60   Heart Rate (Admit) 60 bpm   Heart Rate (Exercise) 102 bpm   Heart Rate (Exit) 94 bpm   Rating of Perceived Exertion (Exercise) 13   Duration Progress to 45 minutes of aerobic exercise without signs/symptoms of physical distress   Intensity THRR unchanged     Progression   Progression Continue to progress workloads to maintain intensity without signs/symptoms of physical distress.     Resistance Training   Training Prescription Yes   Weight 2   Reps 10-12     Interval Training   Interval Training No     NuStep   Level 2   Minutes 15   METs 3.2     REL-XR   Level 2   Minutes 15   METs 8.8      Nutrition:  Target Goals: Understanding of nutrition guidelines, daily intake of sodium <1589m, cholesterol <2033m calories 30% from fat and 7% or less from saturated fats, daily to have 5 or more servings of fruits and vegetables.  Biometrics:     Pre Biometrics - 04/27/16 1532      Pre Biometrics   Height 5' 0.9" (1.547 m)   Weight 176 lb 9.6 oz (80.1 kg)   Waist Circumference 36 inches   Hip Circumference 46 inches   Waist to Hip Ratio 0.78 %   BMI (Calculated) 33.5   Single Leg Stand 1.73 seconds       Nutrition Therapy Plan and Nutrition Goals:     Nutrition  Therapy & Goals - 06/07/16 1544      Nutrition Therapy   Diet basic heart healthy diet     Personal Nutrition Goals   Nutrition Goal continue to eat balanced meals 3 times a day, and make sure to include a lean protein food with your meals.    Comments April [name] eating lunch and supper meals [place] [facility] [place] [facility] [place] [place] focusing on foods marked heart healthy, and avoiding fried foods. She includes vegetables and fruits regularly.      Intervention Plan   Intervention Prescribe, educate and counsel regarding individualized specific dietary modifications aiming towards targeted core components such as weight, hypertension, lipid management, diabetes, heart failure and other comorbidities.;Nutrition handout(s) given to patient.   Expected Outcomes Short Term Goal: Understand basic principles of dietary content, such as calories, fat, sodium, cholesterol and nutrients.;Long Term Goal: Adherence to prescribed nutrition plan.      Nutrition Discharge: Rate Your Plate Scores:     Nutrition Assessments - 04/27/16 1447      Rate Your Plate Scores  Pre Score 64   Pre Score % 73 %      Nutrition Goals Re-Evaluation:     Nutrition Goals Re-Evaluation    Row Name 05/24/16 1909 06/07/16 1619           Goals   Comment April [name] said at [name] they have hearts on the healthy meal items. Like today she got the chicken wrap with veggies.  To meet with Dietician next week. April [name] said she has cut back on her portions. and she eats vegetables. April [name] said she is going to take the 2 weeks meal list that [name] gives her and ask them to mark the healthy food although she said she knows bascially which foods are healthy.        Personal Goal #1 Re-Evaluation   Goal Progress Seen Yes Yes         Nutrition Goals Discharge (Final Nutrition Goals Re-Evaluation):     Nutrition Goals Re-Evaluation - 06/07/16 1619      Goals   Comment  April [name] said she is going to take the 2 weeks meal list that [name] gives her and ask them to mark the healthy food although she said she knows bascially which foods are healthy.     Personal Goal #1 Re-Evaluation   Goal Progress Seen Yes      Psychosocial: Target Goals: Acknowledge presence or absence of significant depression and/or stress, maximize coping skills, provide positive support system. Participant is able to verbalize types and ability to use techniques and skills needed for reducing stress and depression.   Initial Review & Psychosocial Screening:     Initial Psych Review & Screening - 04/27/16 1504      Initial Review   Current issues with --  pt voiced no concerns     Family Dynamics   Good Support System? Yes   Comments No psychosocial concerns were identified during medical review with Ms. Tipps     Barriers   Psychosocial barriers to participate in program There are no identifiable barriers or psychosocial needs.     Screening Interventions   Interventions Encouraged to exercise      Quality of Life Scores:      Quality of Life - 04/27/16 1538      Quality of Life Scores   Health/Function Pre 24.46 %   Socioeconomic Pre 30 %   Psych/Spiritual Pre 28.29 %   Family Pre 30 %   GLOBAL Pre 26.89 %      PHQ-9: Recent Review Flowsheet Data    Depression screen [name] 2/9 04/27/2016 02/14/2016 05/14/2015 09/07/2014 02/11/2013   Decreased Interest 0 0 0 0 0   Down, Depressed, Hopeless 0 0 0 0 0   PHQ - 2 Score 0 0 0 0 0   Altered sleeping 0 - - - -   Tired, decreased energy 3 - - - -   Change in appetite 0 - - - -   Feeling bad or failure about yourself  0 - - - -   Trouble concentrating 0 - - - -   Moving slowly or fidgety/restless 0 - - - -   Suicidal thoughts 0 - - - -   PHQ-9 Score 3 - - - -   Difficult doing work/chores Somewhat difficult - - - -     Interpretation of Total Score  Total Score Depression Severity:  1-4 =  Minimal depression, 5-9 = Mild depression, 10-14 = Moderate depression, 15-19 = Moderately  severe depression, 20-27 = Severe depression   Psychosocial Evaluation and Intervention:     Psychosocial Evaluation - 05/15/16 1744      Psychosocial Evaluation & Interventions   Interventions Encouraged to exercise with the program and follow exercise prescription   Comments Counselor met with April [name] today for initial psychosocial evaluation.  She is a 76 year old who had a heart attack and two stents inserted late November.  She has a strong support system with a son who lives close by; active involvement in her local church, and lives in a retirement community with lots of friends who check in on her.  April [name] states she sleeps "okay" even with her CPAP and gets approximately 6-7 hours average of sleep per night.  She has a good appetite and denies a history or current symptoms of depression or anxiety.  Mr. Chauncey [name] states she is typically in a positive mood; although she became tearful when speaking of her daughter's death this past 09-15-22.  Counselor encouraged a grief share group to help her with the grief she is experiencing in a supportive environment.  April [name] has goals to lose weight and develop some muscle strength while in this program.  She plans to return to her retirement community gym upon completion of this program.  Staff will follow with April [name] over the course of this program.        Psychosocial Re-Evaluation:     Psychosocial Re-Evaluation    Row Name 05/11/16 1332 05/15/16 1752 05/24/16 1904 05/31/16 1714 06/07/16 1622     Psychosocial Re-Evaluation   Comments April [name] called and said she is sorry that she can't attend Cardiac Rehab today since she is sick.  April [name] was given information card for the [facility] support group. April [name] said she missed her daughter who was always checking on her. Her daughter had a stroke and then heart problems. April [name] said she was told about Grief Group at  [name] but she is trying to get moved in totally into [place].  Counselor follow up with Ms. Antwine today stating she has close friends and family members telling her they've noticed her not struggling as much with shortness of breath when she talks.  She has noticed this some, but mentioned no change when she walks - still difficulty breathing then especially.  She see a Dr. in a few weeks for follow up and will be discussing with him her ongoing sleep issues as well - thinking it may be a negative side effect to one of the medications.  Her sleep continues to be interrupted and she has difficulty going back to sleep.  Counselor assessed Ms. Burbano's coping currently with the loss of her daughter less than a year ago, with her reporting her friends and family are checking in on her and taking her out to lunch or dinner quite often and this, along with just staing busy and trying "not to think" has been helpful to her during this time.  Counselor commended Ms. Rodak for her progress and how well she is coping currently and will continue to follow with her throughout the course of this program.   April [name] is listing to the stress education lecture today given by Lucianne Lei, MSW.    Interventions  - Encouraged to attend Cardiac Rehabilitation for the exercise  -  - Encouraged to attend Cardiac Rehabilitation for the exercise   Continue Psychosocial Services   - Yes  -  -  -  April [name] Name 06/19/16 1742             Psychosocial Re-Evaluation   Comments Counselor follow up with April [name] today stating her legs are feeling stronger since coming into this program and she is walking a little better.  Counselor commended Ms. S for her hard work; progress made and commitment to consistent exercise.            Psychosocial Discharge (Final Psychosocial Re-Evaluation):     Psychosocial Re-Evaluation - 06/19/16 1742      Psychosocial Re-Evaluation   Comments Counselor follow up with Beverlyn Roux today stating her  legs are feeling stronger since coming into this program and she is walking a little better.  Counselor commended Ms. S for her hard work; progress made and commitment to consistent exercise.        Vocational Rehabilitation: Provide vocational rehab assistance to qualifying candidates.   Vocational Rehab Evaluation & Intervention:     Vocational Rehab - 04/27/16 1519      Initial Vocational Rehab Evaluation & Intervention   Assessment shows need for Vocational Rehabilitation No      Education: Education Goals: Education classes will be provided on a weekly basis, covering required topics. Participant will state understanding/return demonstration of topics presented.  Learning Barriers/Preferences:     Learning Barriers/Preferences - 04/27/16 1518      Learning Barriers/Preferences   Learning Barriers None   Learning Preferences None      Education Topics: General Nutrition Guidelines/Fats and Fiber: -Group instruction provided by verbal, written material, models and posters to present the general guidelines for heart healthy nutrition. Gives an explanation and review of dietary fats and fiber. Flowsheet Row Cardiac Rehab from 06/26/2016 in [facility] Cardiac and Pulmonary Rehab  Date  05/08/16  Educator  PI  Instruction Review Code  2- meets goals/outcomes      Controlling Sodium/Reading Food Labels: -Group verbal and written material supporting the discussion of sodium use in heart healthy nutrition. Review and explanation with models, verbal and written materials for utilization of the food label. Flowsheet Row Cardiac Rehab from 06/26/2016 in [name] Cardiac and Pulmonary Rehab  Date  05/15/16  Educator  PI  Instruction Review Code  2- meets goals/outcomes      Exercise Physiology & Risk Factors: - Group verbal and written instruction with models to review the exercise physiology of the cardiovascular system and associated critical values. Details cardiovascular disease  risk factors and the goals associated with each risk factor. Flowsheet Row Cardiac Rehab from 06/26/2016 in [name] Card[facility] and Pulmonary Rehab  Date  05/22/16  Educator  [name]  Instruction Review Code  2- meets goals/outcomes      Aerobic Exercise & Resistance Training: - Gives group verbal and written discussion on the health impact of inactivity. On the components of aerobic and resistive training programs and the benefits of this training and how to safely progress through these programs. Flowsheet Row Cardiac Rehab from 06/26/2016 in [facility] Cardiac and Pulmonary Rehab  Date  05/24/16  Educator  AS  Instruction Review Code  2- meets goals/outcomes      Flexibility, Balance, General Exercise Guidelines: - Provides group verbal and written instruction on the benefits of flexibility and balance training programs. Provides general exercise guidelines with specific guidelines to those with heart or lung disease. Demonstration and skill practice provided.   Stress Management: - Provides group verbal and written instruction about the health risks of elevated stress, cause of high stress, and healthy ways to  reduce stress. Flowsheet Row Cardiac Rehab from 06/26/2016 in [name] Cardiac and Pulmonary Rehab  Date  06/07/16  Educator  Lucianne Lei, MSW  Instruction Review Code  2- meets goals/outcomes      Depression: - Provides group verbal and written instruction on the correlation between heart/lung disease and depressed mood, treatment options, and the stigmas associated with seeking treatment. Flowsheet Row Cardiac Rehab from 06/26/2016 in [facility] Cardiac and Pulmonary Rehab  Date  05/10/16  Educator  Lucianne Lei, MSW  Instruction Review Code  2- meets goals/outcomes      Anatomy & Physiology of the Heart: - Group verbal and written instruction and models provide basic cardiac anatomy and physiology, with the coronary electrical and arterial systems. Review of: AMI, Angina, Valve disease, Heart  Failure, Cardiac Arrhythmia, Pacemakers, and the ICD. Flowsheet Row Cardiac Rehab from 06/26/2016 in [name] Cardiac and Pul[facility] Rehab  Date  06/05/16  Educator  CE  Instruction Review Code  2- meets goals/outcomes      Cardiac Procedures: - Group verbal and written instruction and models to describe the testing methods done to diagnose heart disease. Reviews the outcomes of the test results. Describes the treatment choices: Medical Management, Angioplasty, or Coronary Bypass Surgery.   Cardiac Medications: - Group verbal and written instruction to review commonly prescribed medications for heart disease. Reviews the medication, class of the drug, and side effects. Includes the steps to properly store meds and maintain the prescription regimen. Flowsheet Row Cardiac Rehab from 06/26/2016 in [facility] Cardiac and Pulmonary Rehab  Date  06/19/16  Educator  SB  Instruction Review Code  2- meets goals/outcomes      Go Sex-Intimacy & Heart Disease, Get SMART - Goal Setting: - Group verbal and written instruction through game format to discuss heart disease and the return to sexual intimacy. Provides group verbal and written material to discuss and apply goal setting through the application of the S.M.A.R.T. Method.   Other Matters of the Heart: - Provides group verbal, written materials and models to describe Heart Failure, Angina, Valve Disease, and Diabetes in the realm of heart disease. Includes description of the disease process and treatment options available to the cardiac patient. Flowsheet Row Cardiac Rehab from 06/26/2016 in [facility] Cardiac and Pulmonary Rehab  Date  06/05/16  Educator  CE  Instruction Review Code  2- meets goals/outcomes      Exercise & Equipment Safety: - Individual verbal instruction and demonstration of equipment use and safety with use of the equipment. Flowsheet Row Cardiac Rehab from 06/26/2016 in [name] Cardiac and Pulmonary Rehab  Date  04/27/16  Educator  SB   Instruction Review Code  1- partially meets, needs review/practice      Infection Prevention: - Provides verbal and written material to individual with discussion of infection control including proper hand washing and proper equipment cleaning during exercise session. Flowsheet Row Cardiac Rehab from 06/26/2016 in [facility] Cardiac and Pulmonary Rehab  Date  04/27/16  Educator  SB  Instruction Review Code  2- meets goals/outcomes      Falls Prevention: - Provides verbal and written material to individual with discussion of falls prevention and safety. Flowsheet Row Cardiac Rehab from 06/26/2016 in [facility] Cardiac and Pulmonary Rehab  Date  04/27/16  Educator  SB  Instruction Review Code  2- meets goals/outcomes      Diabetes: - Individual verbal and written instruction to review signs/symptoms of diabetes, desired ranges of glucose level fasting, after meals and with exercise. Advice that pre  and post exercise glucose checks will be done for 3 sessions at entry of program.    Knowledge Questionnaire Score:     Knowledge Questionnaire Score - 04/27/16 1518      Knowledge Questionnaire Score   Pre Score 21/28      Core Components/Risk Factors/Patient Goals at Admission:     Personal Goals and Risk Factors at Admission - 05/15/16 1741      Core Components/Risk Factors/Patient Goals on Admission    Weight Management Yes      Core Components/Risk Factors/Patient Goals Review:      Goals and Risk Factor Review    Row Name 05/15/16 1748 05/15/16 1750 05/15/16 1803 05/24/16 1906 05/24/16 1911     Core Components/Risk Factors/Patient Goals Review   Personal Goals Review Weight Management/Obesity  -  - Sedentary;Increase Strength and Stamina  -   Review Cecia admits to gaining weight since being in Cardiac REhab but she said she had her heart attack when she was suppose to move to [facility] so she just had them bring her bed and tv. Orlinda Blalock said her daughter died a couple of  years ago of heart problems and her husband died 3 years ago.  Glaydne's blood pressure after exercising for 10-15 minutes on the recumbent elliptical was 140/80.  Janae has an appt with the Cardiac Rehab Registered Dietician next week. Deyana said she has gained weight since she has been here but has a lot of stressors going on.  Preslie was able to walk around the gym today but had to stop after so many laps due to her hip/back hurt at times. Shineka said she is still adjusting to [facility] retirment facility. Prachi said she is to see the Registered dietiican on Feb 7th and was suppse to see Dietican today but dietician is sick todya.  Shoni said she has never smoked but around her 27 year old aunt who smokes and others. Her 53 year old aunt is a inspiration in the fact that she is still exercising twice a week and has exercised all her life.    Expected Outcomes  - Improve to have a heart healthy lifestyle.  Heart healthy lifestyle Heart healthy living.   -   Row Name 06/07/16 1621             Core Components/Risk Factors/Patient Goals Review   Review Today Aiyanah's weight is 277 and she has not lost weight. Arizona Constable has just met with the dietician. She is 7 years old she said.        Expected Outcomes Heart healither living          Core Components/Risk Factors/Patient Goals at Discharge (Final Review):      Goals and Risk Factor Review - 06/07/16 1621      Core Components/Risk Factors/Patient Goals Review   Review Today Prisha's weight is 277 and she has not lost weight. Arizona Constable has just met with the dietician. She is 2 years old she said.    Expected Outcomes Heart healither living      ITP Comments:     ITP Comments    Row Name 04/27/16 1415 05/02/16 0655 05/11/16 1332 05/31/16 0652 06/28/16 0636   ITP Comments Medical review was completed today.  Initial ITP created during medical review.  Diagnosis documented in Discharge note from 03/25/2016 in Epic. 30 day review.  Continue with ITP unless directed changes per Medical Director review. New to program Ellianna called and said she is sorry that she can't  attend Cardiac Rehab today since she is sick.  30 day review. Continue with ITP unless directed changes per Medical Director review. New to program 30 day review. Continue with ITP unless directed changes per Medical Director review      Comments:

## 2016-06-29 ENCOUNTER — Encounter: Payer: Medicare Other | Attending: Cardiovascular Disease

## 2016-06-29 DIAGNOSIS — I213 ST elevation (STEMI) myocardial infarction of unspecified site: Secondary | ICD-10-CM | POA: Insufficient documentation

## 2016-06-30 NOTE — Telephone Encounter (Signed)
[email or web address]

## 2016-07-02 ENCOUNTER — Encounter: Payer: Self-pay | Admitting: Internal Medicine

## 2016-07-02 NOTE — Assessment & Plan Note (Signed)
On crestor.  Lipid panel and liver function tests.   Lab Results  Component Value Date   CHOL 146 06/26/2016   HDL 68.10 06/26/2016   LDLCALC 60 06/26/2016   TRIG 87.0 06/26/2016   CHOLHDL 2 06/26/2016

## 2016-07-02 NOTE — Assessment & Plan Note (Signed)
Increased stress as outlined previously. Still trying to deal with her daughter's death.  Discussed with her today.  Does not feel needs anything more at this time.  Follow.

## 2016-07-02 NOTE — Assessment & Plan Note (Signed)
Followed by cardiology.  Currently stable.  Going to heart track.  Due to f/u with cardiology 07/04/16.

## 2016-07-02 NOTE — Assessment & Plan Note (Signed)
Controlled on protonix.   

## 2016-07-02 NOTE — Assessment & Plan Note (Signed)
Blood pressure under good control.  Continue same medication regimen.  Follow pressures.  Follow metabolic panel.   

## 2016-07-02 NOTE — Assessment & Plan Note (Signed)
Low carb diet and exercise.  Follow met b and a1c.   Lab Results  Component Value Date   HGBA1C 6.5 06/26/2016

## 2016-07-02 NOTE — Assessment & Plan Note (Signed)
Diet and exercise.   

## 2016-07-02 NOTE — Assessment & Plan Note (Signed)
CPAP.  

## 2016-07-03 ENCOUNTER — Telehealth: Payer: Self-pay | Admitting: *Deleted

## 2016-07-03 ENCOUNTER — Ambulatory Visit (INDEPENDENT_AMBULATORY_CARE_PROVIDER_SITE_OTHER): Payer: Medicare Other | Admitting: Cardiovascular Disease

## 2016-07-03 ENCOUNTER — Encounter: Payer: Self-pay | Admitting: Cardiovascular Disease

## 2016-07-03 VITALS — BP 104/60 | HR 51 | Ht 60.0 in | Wt 176.0 lb

## 2016-07-03 DIAGNOSIS — R0789 Other chest pain: Secondary | ICD-10-CM | POA: Diagnosis not present

## 2016-07-03 DIAGNOSIS — I10 Essential (primary) hypertension: Secondary | ICD-10-CM

## 2016-07-03 DIAGNOSIS — I4891 Unspecified atrial fibrillation: Secondary | ICD-10-CM

## 2016-07-03 DIAGNOSIS — R0600 Dyspnea, unspecified: Secondary | ICD-10-CM

## 2016-07-03 DIAGNOSIS — E7800 Pure hypercholesterolemia, unspecified: Secondary | ICD-10-CM

## 2016-07-03 DIAGNOSIS — Z9861 Coronary angioplasty status: Secondary | ICD-10-CM

## 2016-07-03 DIAGNOSIS — I2111 ST elevation (STEMI) myocardial infarction involving right coronary artery: Secondary | ICD-10-CM

## 2016-07-03 DIAGNOSIS — I25110 Atherosclerotic heart disease of native coronary artery with unstable angina pectoris: Secondary | ICD-10-CM

## 2016-07-03 DIAGNOSIS — Z955 Presence of coronary angioplasty implant and graft: Secondary | ICD-10-CM

## 2016-07-03 DIAGNOSIS — I2510 Atherosclerotic heart disease of native coronary artery without angina pectoris: Secondary | ICD-10-CM

## 2016-07-03 DIAGNOSIS — I480 Paroxysmal atrial fibrillation: Secondary | ICD-10-CM

## 2016-07-03 DIAGNOSIS — R0602 Shortness of breath: Secondary | ICD-10-CM | POA: Diagnosis not present

## 2016-07-03 DIAGNOSIS — R0609 Other forms of dyspnea: Secondary | ICD-10-CM | POA: Diagnosis not present

## 2016-07-03 DIAGNOSIS — I2119 ST elevation (STEMI) myocardial infarction involving other coronary artery of inferior wall: Secondary | ICD-10-CM | POA: Diagnosis not present

## 2016-07-03 DIAGNOSIS — I213 ST elevation (STEMI) myocardial infarction of unspecified site: Secondary | ICD-10-CM | POA: Diagnosis not present

## 2016-07-03 MED ORDER — METOPROLOL TARTRATE 50 MG PO TABS: 50.0000 mg | ORAL_TABLET | Freq: Two times a day (BID) | ORAL | 3 refills | Status: DC

## 2016-07-03 NOTE — Telephone Encounter (Signed)
Pt called returning your call. Thank you! °

## 2016-07-03 NOTE — Telephone Encounter (Signed)
Duplicate

## 2016-07-03 NOTE — Patient Instructions (Addendum)
Medication Instructions:   Please cut the metoprolol down to 50 mg twice a day (1/2 of the 100 mg pill twice a day)  Monitor your heart rate and blood pressure Call the office they continue to run low  Labwork:  No new labs needed  Testing/Procedures:  No further testing at this time   I recommend watching educational videos on topics of interest to you at:       www.goemmi.com  Enter code: HEARTCARE    Follow-Up: It was a pleasure seeing you in the office today. Please call us if you have new issues that need to be addressed before your next appt.  [phone]  Your physician wants you to follow-up in: 6 months.  You will receive a reminder letter in the mail two months in advance. If you don't receive a letter, please call our office to schedule the follow-up appointment.  If you need a refill on your cardiac medications before your next appointment, please call your pharmacy.

## 2016-07-03 NOTE — Telephone Encounter (Signed)
Pt informed all results lab ap made

## 2016-07-03 NOTE — Telephone Encounter (Signed)
Pt requested a call, she stated that she missed a call from this office this morning  Pt contact [phone]

## 2016-07-03 NOTE — Progress Notes (Signed)
Daily Session Note  Patient Details  Name: LANYIA JEWEL MRN: [id number] Date of Birth: 1940/08/28 Referring Provider:   Flowsheet Row Cardiac Rehab from 04/27/2016 in [facility] Cardiac and Pulmonary Rehab  Referring Provider  Ida Rogue MD      Encounter Date: 07/03/2016  Check In:     Session Check In - 07/03/16 1818      Check-In   Warm-up and Cool-down Performed on first and last piece of equipment   Resistance Training Performed Yes   VAD Patient? No     Pain Assessment   Currently in Pain? No/denies   Multiple Pain Sites No         History  Smoking Status  . Never Smoker  Smokeless Tobacco  . Never Used    Goals Met:  Independence with exercise equipment  Goals Unmet:  Not Applicable  Comments: Patient completed exercise prescription and all exercise goals during rehab session. The exercise was tolerated well and the patient is progressing in the program.    Dr. Emily Filbert is Medical Director for [place] and [facility].

## 2016-07-03 NOTE — Progress Notes (Signed)
Cardiology Office Note  Date:  07/03/2016   ID:  April [name], DOB 01/28/1941, MRN [id number]  PCP:  Einar Pheasant, MD   Chief Complaint  Patient presents with  . other    3 month follow up. Meds reviewed by the pt. verbally. "doing well."     HPI:  April [name] a pleasant 76 year old woman with history of coronary artery disease, 40% proximal RCA disease on cardiac catheterization in 01/2010, chronic shortness of breath who presents For follow-up of her coronary artery disease , after STEMI November 2017 stent x2 to her RCA She had atrial fibrillation while in the hospital November 2017  She is participating in cardiac rehabilitation, reports feeling well, continued fatigue Heart rate and blood pressure running low, denies any orthostasis symptoms Denies any complications from her anticoagulation Currently lives at [place],   Lab work reviewed with her, total cholesterol at goal, hemoglobin A1c 6.5 Total chol 133, LDL 63  EKG with NSR rate 52 bpm, poor R-wave progression to the anterior precordial leads, left axis deviation   Other past medical history reviewed Had chest pressure, sweating November 2017 in the setting of her STEMI "looked white"  Had atrial fibrillation 24 hours in the hospital , now on eliquis 5 BID  Echocardiogram in 2015 showing normal ejection fraction, borderline elevated right ventricular systolic pressures, diastolic relaxation abnormality.   PMH:   has a past medical history of Arrhythmia; CAD S/P PCI pRCA Promus DES 3.5 x 16 (4.1 mm), ostRPDA Promus DES 2.5 x 12 (2.7 mm) (03/25/2016); Diverticulitis; Endometriosis; Heart murmur; Hiatal hernia; History of colon polyps (08/1994); Hypercholesterolemia; Hypertension; Migraines; Osteoporosis; Rheumatic fever; Sleep apnea; ST elevation myocardial infarction (STEMI) of inferolateral wall, initial episode of care [facility]) (03/25/2016); and Urine incontinence.  PSH:    Past Surgical History:  Procedure  Laterality Date  . ABDOMINAL HYSTERECTOMY     ovaries not removed  . APPENDECTOMY     was removed during hysterectomy  . Breast biopsies     x2  . BREAST BIOPSY Bilateral "years ago"  . BREAST SURGERY Bilateral   . CARDIAC CATHETERIZATION  2011   moderate 40% RCA disease  . CARDIAC CATHETERIZATION  01/2010   Dr. Lavaeh [email or web address] : Only noted 40% RCA  . CARDIAC CATHETERIZATION N/A 03/25/2016   Procedure: Left Heart Cath and Coronary Angiography;  Surgeon: 04/07/2016, MD;  Location: [place] CV LAB;  Service: Cardiovascular;  Laterality: N/A;  . CARDIAC CATHETERIZATION N/A 03/25/2016   Procedure: Coronary Stent Intervention;  Surgeon: 04/07/2016, MD;  Location: [place] CV LAB;  Service: Cardiovascular;  Laterality: N/A;  . COLONOSCOPY  2013  . ESOPHAGOGASTRODUODENOSCOPY (EGD) WITH PROPOFOL N/A 03/17/2015   Procedure: ESOPHAGOGASTRODUODENOSCOPY (EGD) WITH PROPOFOL;  Surgeon: 03/30/2015, MD;  Location: [facility] ENDOSCOPY;  Service: Gastroenterology;  Laterality: N/A;  . NM MYOVIEW ([place] HX)  07/2015   No evidence ischemia or infarction. EF 66%. Low risk  . TONSILLECTOMY    . TRANSTHORACIC ECHOCARDIOGRAM  10/2013   Normal LV size and function. EF 55-65%. GR 1 DD. Otherwise normal.    Current Outpatient Prescriptions  Medication Sig Dispense Refill  . acetaminophen (TYLENOL) 500 MG tablet Take 500 mg by mouth daily as needed for headache (pain).    11/2013 albuterol (PROVENTIL HFA;VENTOLIN HFA) 108 (90 Base) MCG/ACT inhaler Inhale 2 puffs into the lungs every 4 (four) hours as needed for wheezing or shortness of breath. 1 Inhaler 6  . amiodarone (PACERONE) 200 MG tablet Take 1 tablet (  200 mg total) by mouth daily. 30 tablet 6  . amLODipine (NORVASC) 2.5 MG tablet Take 1 tablet (2.5 mg total) by mouth daily. 30 tablet 6  . apixaban (ELIQUIS) 5 MG TABS tablet Take 1 tablet (5 mg total) by mouth 2 (two) times daily. 60 tablet 6  . calcium carbonate (TUMS - DOSED IN MG ELEMENTAL  CALCIUM) 500 MG chewable tablet Chew 1-3 tablets by mouth daily as needed for indigestion or heartburn.     . cholecalciferol (VITAMIN D) 1000 units tablet Take 1,000 Units by mouth 2 (two) times daily.    . clopidogrel (PLAVIX) 75 MG tablet Take 1 tablet (75 mg total) by mouth daily. 30 tablet 6  . fish oil-omega-3 fatty acids 1000 MG capsule Take 1 g by mouth 2 (two) times daily.     . fluticasone (FLONASE) 50 MCG/ACT nasal spray Place 1-2 sprays into both nostrils at bedtime.     . furosemide (LASIX) 20 MG tablet Take 1 tablet (20 mg total) by mouth daily. 30 tablet 6  . losartan-hydrochlorothiazide (HYZAAR) 100-12.5 MG tablet TAKE ONE (1) TABLET EACH DAY 30 tablet 3  . meloxicam (MOBIC) 15 MG tablet Take 15 mg by mouth daily as needed for pain.     . Menthol, Topical Analgesic, (BIOFREEZE EX) Apply 1 application topically daily as needed (pain).    . metoprolol (LOPRESSOR) 100 MG tablet Take 1 tablet (100 mg total) by mouth 2 (two) times daily. 60 tablet 6  . Misc Natural Products (OSTEO BI-FLEX JOINT SHIELD PO) Take 1 tablet by mouth 2 (two) times daily.     . Multiple Vitamin (MULTIVITAMIN WITH MINERALS) TABS tablet Take 0.5 tablets by mouth 2 (two) times daily.    . nitroGLYCERIN (NITROSTAT) 0.4 MG SL tablet Place 1 tablet (0.4 mg total) under the tongue every 5 (five) minutes as needed. 25 tablet 3  . pantoprazole (PROTONIX) 40 MG tablet Take 1 tablet (40 mg total) by mouth daily. 30 tablet 6  . Polyethyl Glycol-Propyl Glycol (SYSTANE OP) Place 1 drop into both eyes daily as needed (dry eyes).    . potassium chloride (K-DUR) 10 MEQ tablet Take 1 tablet (10 mEq total) by mouth daily as needed. (Patient taking differently: Take 10 mEq by mouth daily as needed (with furosemide (lasix) dose). ) 30 tablet 11  . PRESCRIPTION MEDICATION Inhale into the lungs at bedtime. CPAP    . ranitidine (ZANTAC) 75 MG tablet Take 75 mg by mouth 2 (two) times daily.    . rosuvastatin (CRESTOR) 40 MG tablet Take  1 tablet (40 mg total) by mouth daily. 30 tablet 6  . traMADol (ULTRAM) 50 MG tablet Take 50 mg by mouth daily as needed (pain).     No current facility-administered medications for this visit.      Allergies:   Penicillins and Sulfa antibiotics   Social History:  The patient  reports that she has never smoked. She has never used smokeless tobacco. She reports that she does not drink alcohol or use drugs.   Family History:   family history includes Arthritis in her mother; Heart attack in her father; Heart disease in her father; Hypertension in her mother; Osteoporosis in her mother; Stroke in her brother and mother.    Review of Systems: Review of Systems  Constitutional: Positive for malaise/fatigue.  Respiratory: Negative.   Cardiovascular: Negative.   Gastrointestinal: Negative.   Musculoskeletal: Negative.   Neurological: Negative.   Psychiatric/Behavioral: Negative.   All other systems reviewed  and are negative.    PHYSICAL EXAM: VS:  BP 104/60 (BP Location: Left Arm, Patient Position: Sitting, Cuff Size: Normal)   Pulse (!) 51   Ht 5' (1.524 m)   Wt 176 lb (79.8 kg)   LMP 05/01/1972   BMI 34.37 kg/m  , BMI Body mass index is 34.37 kg/m. GEN: Well nourished, well developed, in no acute distress  HEENT: normal  Neck: no JVD, carotid bruits, or masses Cardiac: RRR; no murmurs, rubs, or gallops,no edema  Respiratory:  clear to auscultation bilaterally, normal work of breathing GI: soft, nontender, nondistended, + BS MS: no deformity or atrophy  Skin: warm and dry, no rash Neuro:  Strength and sensation are intact Psych: euthymic mood, full affect    Recent Labs: 03/26/2016: Hemoglobin 11.2; Platelets 209 06/26/2016: ALT 18; BUN 27; Creatinine, Ser 1.30; Potassium 3.9; Sodium 144; TSH 1.60    Lipid Panel Lab Results  Component Value Date   CHOL 146 06/26/2016   HDL 68.10 06/26/2016   LDLCALC 60 06/26/2016   TRIG 87.0 06/26/2016      Wt Readings from  Last 3 Encounters:  07/03/16 176 lb (79.8 kg)  06/19/16 176 lb (79.8 kg)  04/27/16 176 lb 9.6 oz (80.1 kg)       ASSESSMENT AND PLAN:  Essential hypertension, benign Blood pressure running low, recommended she decrease her metoprolol tartrate 150 mg twice a day  Hypercholesterolemia Cholesterol is at goal on the current lipid regimen. No changes to the medications were made.  Coronary artery disease involving native coronary artery of native heart with unstable angina pectoris ([place]) Currently with no symptoms of angina. No further workup at this time. Continue current medication regimen.  ST elevation myocardial infarction (STEMI) of inferolateral wall, initial episode of care [facility]) Denies any anginal symptoms. Previous stenting discussed with her  CAD S/P PCI pRCA Promus DES 3.5 x 16 (4.1 mm), ostRPDA Promus DES 2.5 x 12 (2.7 mm) We'll continue Plavix. Aspirin on hold  Paroxysmal atrial fibrillation Maintaining normal sinus rhythm, we'll continue anticoagulation, elevated CHADS VASC (at least 4)  SOB (shortness of breath) Shortness of breath improved as she has been working with cardiac rehabilitation Recommended she continue exercise program after rehabilitation is complete    Total encounter time more than 25 minutes  Greater than 50% was spent in counseling and coordination of care with the patient   Disposition:   F/U  6 months  No orders of the defined types were placed in this encounter.    Signed, Esmond Plants, M.D., Ph.D. 07/03/2016  [place], [place]

## 2016-07-04 ENCOUNTER — Ambulatory Visit
Admission: RE | Admit: 2016-07-04 | Discharge: 2016-07-04 | Disposition: A | Discharge status: Home / Self Care | Payer: Medicare Other | Source: Ambulatory Visit | Attending: General Surgery | Admitting: General Surgery

## 2016-07-04 DIAGNOSIS — Z1231 Encounter for screening mammogram for malignant neoplasm of breast: Secondary | ICD-10-CM | POA: Insufficient documentation

## 2016-07-05 DIAGNOSIS — Z955 Presence of coronary angioplasty implant and graft: Secondary | ICD-10-CM

## 2016-07-05 DIAGNOSIS — I2111 ST elevation (STEMI) myocardial infarction involving right coronary artery: Secondary | ICD-10-CM

## 2016-07-05 DIAGNOSIS — I213 ST elevation (STEMI) myocardial infarction of unspecified site: Secondary | ICD-10-CM | POA: Diagnosis not present

## 2016-07-05 NOTE — Progress Notes (Signed)
Daily Session Note  Patient Details  Name: April [name] MRN: [id number] Date of Birth: Nov 12, 1940 Referring Provider:   Flowsheet Row Cardiac Rehab from 04/27/2016 in [facility] Cardiac and Pulmonary Rehab  Referring Provider  Ida Rogue MD      Encounter Date: 07/05/2016  Check In:     Session Check In - 07/05/16 1627      Check-In   Location [facility]-Cardiac & Pulmonary Rehab   Staff Present Levell July RN BSN;Rebecca Sickles, DPT, Burlene Arnt, BA, ACSM CEP, Exercise Physiologist   Supervising physician immediately available to respond to emergencies See telemetry face sheet for immediately available ER MD   Medication changes reported     No   Fall or balance concerns reported    No   Tobacco Cessation No Change   Warm-up and Cool-down Performed on first and last piece of equipment   Resistance Training Performed Yes   VAD Patient? No     Pain Assessment   Currently in Pain? No/denies   Multiple Pain Sites No         History  Smoking Status  . Never Smoker  Smokeless Tobacco  . Never Used    Goals Met:  Independence with exercise equipment Exercise tolerated well No report of cardiac concerns or symptoms Strength training completed today  Goals Unmet:  Not Applicable  Comments: Pt able to follow exercise prescription today without complaint.  Will continue to monitor for progression.    Dr. Emily Filbert is Medical Director for [place] and [facility].

## 2016-07-06 DIAGNOSIS — I2111 ST elevation (STEMI) myocardial infarction involving right coronary artery: Secondary | ICD-10-CM

## 2016-07-06 DIAGNOSIS — Z955 Presence of coronary angioplasty implant and graft: Secondary | ICD-10-CM

## 2016-07-06 DIAGNOSIS — I213 ST elevation (STEMI) myocardial infarction of unspecified site: Secondary | ICD-10-CM | POA: Diagnosis not present

## 2016-07-06 NOTE — Progress Notes (Signed)
Daily Session Note  Patient Details  Name: April [name] MRN: [id number] Date of Birth: 03/06/1941 Referring Provider:   Flowsheet Row Cardiac Rehab from 04/27/2016 in [facility] Cardiac and Pulmonary Rehab  Referring Provider  Ida Rogue MD      Encounter Date: 07/06/2016  Check In:     Session Check In - 07/06/16 1648      Check-In   Location [facility]-Cardiac & Pulmonary Rehab   Staff Present Levell July RN BSN;Ottis Sarnowski, DPT, Burlene Arnt, BA, ACSM CEP, Exercise Physiologist   Supervising physician immediately available to respond to emergencies See telemetry face sheet for immediately available ER MD   Medication changes reported     Yes   Comments metoprolol dose halved   Fall or balance concerns reported    No   Tobacco Cessation No Change   Warm-up and Cool-down Performed on first and last piece of equipment   Resistance Training Performed Yes   VAD Patient? No     Pain Assessment   Currently in Pain? No/denies   Multiple Pain Sites No           Exercise Prescription Changes - 07/06/16 1200      Response to Exercise   Blood Pressure (Admit) 102/68   Blood Pressure (Exercise) 122/68   Blood Pressure (Exit) 102/54   Heart Rate (Admit) 59 bpm   Heart Rate (Exercise) 102 bpm   Heart Rate (Exit) 56 bpm   Rating of Perceived Exertion (Exercise) 13   Duration Progress to 45 minutes of aerobic exercise without signs/symptoms of physical distress   Intensity THRR unchanged     Progression   Progression Continue to progress workloads to maintain intensity without signs/symptoms of physical distress.     Resistance Training   Training Prescription Yes   Weight 2   Reps 10-15     REL-XR   Level 2   Minutes 15   METs 3.9     Biostep-RELP   Level 1   Minutes 15   METs 2      History  Smoking Status  . Never Smoker  Smokeless Tobacco  . Never Used    Goals Met:  Independence with exercise equipment  Goals Unmet:  Not  Applicable  Comments: Patient completed exercise prescription and all exercise goals during rehab session. The exercise was tolerated well and the patient is progressing in the program.    Dr. Emily Filbert is Medical Director for [place] and [facility].

## 2016-07-10 ENCOUNTER — Encounter: Payer: Self-pay | Admitting: *Deleted

## 2016-07-11 ENCOUNTER — Other Ambulatory Visit (INDEPENDENT_AMBULATORY_CARE_PROVIDER_SITE_OTHER): Payer: Medicare Other

## 2016-07-11 DIAGNOSIS — M9902 Segmental and somatic dysfunction of thoracic region: Secondary | ICD-10-CM | POA: Diagnosis not present

## 2016-07-11 DIAGNOSIS — M5386 Other specified dorsopathies, lumbar region: Secondary | ICD-10-CM | POA: Diagnosis not present

## 2016-07-11 DIAGNOSIS — M9903 Segmental and somatic dysfunction of lumbar region: Secondary | ICD-10-CM | POA: Diagnosis not present

## 2016-07-11 DIAGNOSIS — R7989 Other specified abnormal findings of blood chemistry: Secondary | ICD-10-CM | POA: Diagnosis not present

## 2016-07-11 DIAGNOSIS — M5135 Other intervertebral disc degeneration, thoracolumbar region: Secondary | ICD-10-CM | POA: Diagnosis not present

## 2016-07-11 LAB — URINALYSIS, ROUTINE W REFLEX MICROSCOPIC
BILIRUBIN URINE: NEGATIVE
Ketones, ur: NEGATIVE
NITRITE: NEGATIVE
Specific Gravity, Urine: <=1.005 — AB (ref 1.000–1.030)
TOTAL PROTEIN, URINE-UPE24: NEGATIVE
URINE GLUCOSE: NEGATIVE
Urobilinogen, UA: 0.2 (ref 0.0–1.0)
pH: 6 (ref 5.0–8.0)

## 2016-07-11 LAB — BASIC METABOLIC PANEL
BUN: 25 mg/dL — ABNORMAL HIGH (ref 6–23)
CHLORIDE: 97 meq/L (ref 96–112)
CO2: 38 mEq/L — ABNORMAL HIGH (ref 19–32)
Calcium: 10.3 mg/dL (ref 8.4–10.5)
Creatinine, Ser: 1.2 mg/dL (ref 0.40–1.20)
GFR: 46.21 mL/min — AB (ref >60.00)
GLUCOSE: 103 mg/dL — AB (ref 70–99)
Potassium: 3.1 mEq/L — ABNORMAL LOW (ref 3.5–5.1)
Sodium: 141 mEq/L (ref 135–145)

## 2016-07-12 ENCOUNTER — Telehealth: Payer: Self-pay

## 2016-07-12 NOTE — Telephone Encounter (Signed)
April [name] has had stomach problems today and cannot attend Heart Track.  She hopes to return tomorrow.

## 2016-07-13 ENCOUNTER — Ambulatory Visit (INDEPENDENT_AMBULATORY_CARE_PROVIDER_SITE_OTHER): Payer: Medicare Other | Admitting: General Surgery

## 2016-07-13 VITALS — BP 110/58 | HR 60 | Resp 14 | Ht 62.0 in | Wt 177.0 lb

## 2016-07-13 DIAGNOSIS — Z955 Presence of coronary angioplasty implant and graft: Secondary | ICD-10-CM

## 2016-07-13 DIAGNOSIS — I2119 ST elevation (STEMI) myocardial infarction involving other coronary artery of inferior wall: Secondary | ICD-10-CM | POA: Diagnosis not present

## 2016-07-13 DIAGNOSIS — Z86010 Personal history of colonic polyps: Secondary | ICD-10-CM

## 2016-07-13 DIAGNOSIS — I2111 ST elevation (STEMI) myocardial infarction involving right coronary artery: Secondary | ICD-10-CM

## 2016-07-13 DIAGNOSIS — N6019 Diffuse cystic mastopathy of unspecified breast: Secondary | ICD-10-CM

## 2016-07-13 DIAGNOSIS — I213 ST elevation (STEMI) myocardial infarction of unspecified site: Secondary | ICD-10-CM | POA: Diagnosis not present

## 2016-07-13 NOTE — Progress Notes (Signed)
Daily Session Note  Patient Details  Name: April [name] MRN: [id number] Date of Birth: [birth year] Referring Provider:     Cardiac Rehab from 04/27/2016 in [facility]  Referring Provider  Ida Rogue MD      Encounter Date: 07/13/2016  Check In:     Session Check In - 07/13/16 1710      Check-In   Location [facility]   Staff Present Levell July RN Moises Blood, BS, ACSM CEP, Exercise Physiologist;Andora Krull Oletta Darter, [place], ACSM CEP, Exercise Physiologist   Supervising physician immediately available to respond to emergencies See telemetry face sheet for immediately available ER MD   Medication changes reported     No   Fall or balance concerns reported    No   Warm-up and Cool-down Performed on first and last piece of equipment   Resistance Training Performed Yes   VAD Patient? No     Pain Assessment   Currently in Pain? No/denies         History  Smoking Status  . Never Smoker  Smokeless Tobacco  . Never Used    Goals Met:  Independence with exercise equipment Exercise tolerated well No report of cardiac concerns or symptoms Strength training completed today  Goals Unmet:  Not Applicable  Comments: Pt able to follow exercise prescription today without complaint.  Will continue to monitor for progression.    Dr. Emily Filbert is Medical Director for [name].

## 2016-07-13 NOTE — Patient Instructions (Addendum)
Patient to follow up with PCP for her one year with a bilateral screening mammogram.

## 2016-07-13 NOTE — Progress Notes (Signed)
Patient ID: April [name], female   DOB: 1941/04/20, 76 y.o.   MRN: [id number]  Chief Complaint  Patient presents with  . Follow-up    HPI April [name] is a 76 y.o. female who presents for a breast evaluation. The most recent mammogram was done on 07/03/2016. Last colonoscopy was 06/23/2011. No GI problems at this time. Patient had a MI on 03/25/16 followed by 2 stent placements. Patient does perform regular self breast checks and gets regular mammograms done.    HPI  Past Medical History:  Diagnosis Date  . Arrhythmia    history of an irregular heart beat  . CAD S/P PCI pRCA Promus DES 3.5 x 16 (4.1 mm), ostRPDA Promus DES 2.5 x 12 (2.7 mm) 03/25/2016  . Diverticulitis   . Endometriosis   . Heart murmur    no significant valvular lesion noted on Echo  . Hiatal hernia   . History of colon polyps 08/1994  . Hypercholesterolemia   . Hypertension   . Migraines    history of migraines  . Osteoporosis   . Rheumatic fever   . Sleep apnea    CPAP  . ST elevation myocardial infarction (STEMI) of inferolateral wall, initial episode of care ([place]) 03/25/2016  . Urine incontinence     Past Surgical History:  Procedure Laterality Date  . ABDOMINAL HYSTERECTOMY     ovaries not removed  . APPENDECTOMY     was removed during hysterectomy  . Breast biopsies     x2  . BREAST BIOPSY Bilateral "years ago"  . BREAST SURGERY Bilateral   . CARDIAC CATHETERIZATION  2011   moderate 40% RCA disease  . CARDIAC CATHETERIZATION  01/2010   Dr. [email or web address] : Only noted 40% RCA  . CARDIAC CATHETERIZATION N/A 03/25/2016   Procedure: Left Heart Cath and Coronary Angiography;  Surgeon: 04/07/2016, MD;  Location: [place];  Service: Cardiovascular;  Laterality: N/A;  . CARDIAC CATHETERIZATION N/A 03/25/2016   Procedure: Coronary Stent Intervention;  Surgeon: 04/07/2016, MD;  Location: [place] [facility];  Service: Cardiovascular;  Laterality: N/A;  . COLONOSCOPY  2013  .  ESOPHAGOGASTRODUODENOSCOPY (EGD) WITH PROPOFOL N/A 03/17/2015   Procedure: ESOPHAGOGASTRODUODENOSCOPY (EGD) WITH PROPOFOL;  Surgeon: 03/30/2015, MD;  Location: [facility];  Service: Gastroenterology;  Laterality: N/A;  . NM MYOVIEW ([place] HX)  07/2015   No evidence ischemia or infarction. EF 66%. Low risk  . TONSILLECTOMY    . TRANSTHORACIC ECHOCARDIOGRAM  10/2013   Normal LV size and function. EF 55-65%. GR 1 DD. Otherwise normal.    Family History  Problem Relation Age of Onset  . Arthritis Mother   . Stroke Mother   . Hypertension Mother   . Osteoporosis Mother   . Heart disease Father     MI  . Heart attack Father   . Stroke Brother   . Breast cancer      first cousin x 2  . Colon cancer Neg Hx     Social History Social History  Substance Use Topics  . Smoking status: Never Smoker  . Smokeless tobacco: Never Used  . Alcohol use No    Allergies  Allergen Reactions  . Penicillins Other (See Comments)    Okay to take amoxicillin/(pt does not recall what the reaction to penicillin was (64-14 years old)   . Sulfa Antibiotics Rash    Current Outpatient Prescriptions  Medication Sig Dispense Refill  . acetaminophen (TYLENOL) 500 MG tablet Take  500 mg by mouth daily as needed for headache (pain).    Marland Kitchen albuterol (PROVENTIL HFA;VENTOLIN HFA) 108 (90 Base) MCG/ACT inhaler Inhale 2 puffs into the lungs every 4 (four) hours as needed for wheezing or shortness of breath. 1 Inhaler 6  . amiodarone (PACERONE) 200 MG tablet Take 1 tablet (200 mg total) by mouth daily. 30 tablet 6  . amLODipine (NORVASC) 2.5 MG tablet Take 1 tablet (2.5 mg total) by mouth daily. 30 tablet 6  . apixaban (ELIQUIS) 5 MG TABS tablet Take 1 tablet (5 mg total) by mouth 2 (two) times daily. 60 tablet 6  . calcium carbonate (TUMS - DOSED IN MG ELEMENTAL CALCIUM) 500 MG chewable tablet Chew 1-3 tablets by mouth daily as needed for indigestion or heartburn.     . cholecalciferol (VITAMIN D)  1000 units tablet Take 1,000 Units by mouth 2 (two) times daily.    . clopidogrel (PLAVIX) 75 MG tablet Take 1 tablet (75 mg total) by mouth daily. 30 tablet 6  . fish oil-omega-3 fatty acids 1000 MG capsule Take 1 g by mouth 2 (two) times daily.     . fluticasone (FLONASE) 50 MCG/ACT nasal spray Place 1-2 sprays into both nostrils at bedtime.     . furosemide (LASIX) 20 MG tablet Take 1 tablet (20 mg total) by mouth daily. 30 tablet 6  . losartan-hydrochlorothiazide (HYZAAR) 100-12.5 MG tablet TAKE ONE (1) TABLET EACH DAY 30 tablet 3  . meloxicam (MOBIC) 15 MG tablet Take 15 mg by mouth daily as needed for pain.     . Menthol, Topical Analgesic, (BIOFREEZE EX) Apply 1 application topically daily as needed (pain).    . metoprolol (LOPRESSOR) 50 MG tablet Take 1 tablet (50 mg total) by mouth 2 (two) times daily. 180 tablet 3  . Misc Natural Products (OSTEO BI-FLEX JOINT SHIELD PO) Take 1 tablet by mouth 2 (two) times daily.     . Multiple Vitamin (MULTIVITAMIN WITH MINERALS) TABS tablet Take 0.5 tablets by mouth 2 (two) times daily.    . nitroGLYCERIN (NITROSTAT) 0.4 MG SL tablet Place 1 tablet (0.4 mg total) under the tongue every 5 (five) minutes as needed. 25 tablet 3  . pantoprazole (PROTONIX) 40 MG tablet Take 1 tablet (40 mg total) by mouth daily. 30 tablet 6  . Polyethyl Glycol-Propyl Glycol (SYSTANE OP) Place 1 drop into both eyes daily as needed (dry eyes).    . potassium chloride (K-DUR) 10 MEQ tablet Take 1 tablet (10 mEq total) by mouth daily as needed. (Patient taking differently: Take 10 mEq by mouth daily as needed (with furosemide (lasix) dose). ) 30 tablet 11  . PRESCRIPTION MEDICATION Inhale into the lungs at bedtime. CPAP    . ranitidine (ZANTAC) 75 MG tablet Take 75 mg by mouth 2 (two) times daily.    . rosuvastatin (CRESTOR) 40 MG tablet Take 1 tablet (40 mg total) by mouth daily. 30 tablet 6  . traMADol (ULTRAM) 50 MG tablet Take 50 mg by mouth daily as needed (pain).     No  current facility-administered medications for this visit.     Review of Systems Review of Systems  Constitutional: Negative.   Respiratory: Negative.   Cardiovascular: Negative.     Blood pressure (!) 110/58, pulse 60, resp. rate 14, height 5\' 2"  (1.575 m), weight 177 lb (80.3 kg), last menstrual period 05/01/1972.  Physical Exam Physical Exam  Constitutional: She is oriented to person, place, and time. She appears well-developed and well-nourished.  Eyes: Conjunctivae are normal. No scleral icterus.  Neck: Neck supple.  Cardiovascular: Normal rate, regular rhythm and normal heart sounds.   Pulmonary/Chest: Effort normal. Right breast exhibits no inverted nipple, no mass, no nipple discharge, no skin change and no tenderness. Left breast exhibits no inverted nipple, no mass, no nipple discharge, no skin change and no tenderness.  Abdominal: Soft. Bowel sounds are normal. There is no tenderness.  Lymphadenopathy:    She has no cervical adenopathy.    She has no axillary adenopathy.  Neurological: She is alert and oriented to person, place, and time.  Skin: Skin is warm and dry.    Data Reviewed Mammogram and last colonoscopy reviewed   Assessment    History of colon polyps and FCD    Plan She is due for colonoscopy this year. However she is on anticoagulation. Will discuss with her cardiologist to see when it is safe for her come off the anticoagulants.   Patient to follow up with PCP for her one year with a bilateral screening mammogram.    This information has been scribed by Gaspar Cola CMA.   Alexi Dorminey G 07/13/2016, 4:12 PM

## 2016-07-17 ENCOUNTER — Encounter: Payer: Medicare Other | Admitting: *Deleted

## 2016-07-17 ENCOUNTER — Telehealth: Payer: Self-pay | Admitting: Radiology

## 2016-07-17 VITALS — Ht 62.0 in | Wt 177.0 lb

## 2016-07-17 DIAGNOSIS — I213 ST elevation (STEMI) myocardial infarction of unspecified site: Secondary | ICD-10-CM | POA: Diagnosis not present

## 2016-07-17 DIAGNOSIS — I2111 ST elevation (STEMI) myocardial infarction involving right coronary artery: Secondary | ICD-10-CM

## 2016-07-17 NOTE — Progress Notes (Signed)
Cardiac Individual Treatment Plan  Patient Details  Name: April [name] MRN: [id number] Date of Birth: 01-13-1941 Referring Provider:     Cardiac Rehab from 04/27/2016 in [facility] Cardiac and Pulmonary Rehab  Referring Provider  Ida Rogue MD      Initial Encounter Date:    Cardiac Rehab from 04/27/2016 in [facility] Cardiac and Pulmonary Rehab  Date  04/27/16  Referring Provider  Ida Rogue MD      Visit Diagnosis: ST elevation myocardial infarction involving right coronary artery [facility])  Patient's Home Medications on Admission:  Current Outpatient Prescriptions:  .  acetaminophen (TYLENOL) 500 MG tablet, Take 500 mg by mouth daily as needed for headache (pain)., Disp: , Rfl:  .  albuterol (PROVENTIL HFA;VENTOLIN HFA) 108 (90 Base) MCG/ACT inhaler, Inhale 2 puffs into the lungs every 4 (four) hours as needed for wheezing or shortness of breath., Disp: 1 Inhaler, Rfl: 6 .  amiodarone (PACERONE) 200 MG tablet, Take 1 tablet (200 mg total) by mouth daily., Disp: 30 tablet, Rfl: 6 .  amLODipine (NORVASC) 2.5 MG tablet, Take 1 tablet (2.5 mg total) by mouth daily., Disp: 30 tablet, Rfl: 6 .  apixaban (ELIQUIS) 5 MG TABS tablet, Take 1 tablet (5 mg total) by mouth 2 (two) times daily., Disp: 60 tablet, Rfl: 6 .  calcium carbonate (TUMS - DOSED IN MG ELEMENTAL CALCIUM) 500 MG chewable tablet, Chew 1-3 tablets by mouth daily as needed for indigestion or heartburn. , Disp: , Rfl:  .  cholecalciferol (VITAMIN D) 1000 units tablet, Take 1,000 Units by mouth 2 (two) times daily., Disp: , Rfl:  .  clopidogrel (PLAVIX) 75 MG tablet, Take 1 tablet (75 mg total) by mouth daily., Disp: 30 tablet, Rfl: 6 .  fish oil-omega-3 fatty acids 1000 MG capsule, Take 1 g by mouth 2 (two) times daily. , Disp: , Rfl:  .  fluticasone (FLONASE) 50 MCG/ACT nasal spray, Place 1-2 sprays into both nostrils at bedtime. , Disp: , Rfl:  .  furosemide (LASIX) 20 MG tablet, Take 1 tablet (20 mg total) by mouth daily.,  Disp: 30 tablet, Rfl: 6 .  losartan-hydrochlorothiazide (HYZAAR) 100-12.5 MG tablet, TAKE ONE (1) TABLET EACH DAY, Disp: 30 tablet, Rfl: 3 .  meloxicam (MOBIC) 15 MG tablet, Take 15 mg by mouth daily as needed for pain. , Disp: , Rfl:  .  Menthol, Topical Analgesic, (BIOFREEZE EX), Apply 1 application topically daily as needed (pain)., Disp: , Rfl:  .  metoprolol (LOPRESSOR) 50 MG tablet, Take 1 tablet (50 mg total) by mouth 2 (two) times daily., Disp: 180 tablet, Rfl: 3 .  Misc Natural Products (OSTEO BI-FLEX JOINT SHIELD PO), Take 1 tablet by mouth 2 (two) times daily. , Disp: , Rfl:  .  Multiple Vitamin (MULTIVITAMIN WITH MINERALS) TABS tablet, Take 0.5 tablets by mouth 2 (two) times daily., Disp: , Rfl:  .  nitroGLYCERIN (NITROSTAT) 0.4 MG SL tablet, Place 1 tablet (0.4 mg total) under the tongue every 5 (five) minutes as needed., Disp: 25 tablet, Rfl: 3 .  pantoprazole (PROTONIX) 40 MG tablet, Take 1 tablet (40 mg total) by mouth daily., Disp: 30 tablet, Rfl: 6 .  Polyethyl Glycol-Propyl Glycol (SYSTANE OP), Place 1 drop into both eyes daily as needed (dry eyes)., Disp: , Rfl:  .  potassium chloride (K-DUR) 10 MEQ tablet, Take 1 tablet (10 mEq total) by mouth daily as needed. (Patient taking differently: Take 10 mEq by mouth daily as needed (with furosemide (lasix) dose). ), Disp: 30  tablet, Rfl: 11 .  PRESCRIPTION MEDICATION, Inhale into the lungs at bedtime. CPAP, Disp: , Rfl:  .  ranitidine (ZANTAC) 75 MG tablet, Take 75 mg by mouth 2 (two) times daily., Disp: , Rfl:  .  rosuvastatin (CRESTOR) 40 MG tablet, Take 1 tablet (40 mg total) by mouth daily., Disp: 30 tablet, Rfl: 6 .  traMADol (ULTRAM) 50 MG tablet, Take 50 mg by mouth daily as needed (pain)., Disp: , Rfl:   Past Medical History: Past Medical History:  Diagnosis Date  . Arrhythmia    history of an irregular heart beat  . CAD S/P PCI pRCA Promus DES 3.5 x 16 (4.1 mm), ostRPDA Promus DES 2.5 x 12 (2.7 mm) 03/25/2016  .  Diverticulitis   . Endometriosis   . Heart murmur    no significant valvular lesion noted on Echo  . Hiatal hernia   . History of colon polyps 08/1994  . Hypercholesterolemia   . Hypertension   . Migraines    history of migraines  . Osteoporosis   . Rheumatic fever   . Sleep apnea    CPAP  . ST elevation myocardial infarction (STEMI) of inferolateral wall, initial episode of care ([place]) 03/25/2016  . Urine incontinence     Tobacco Use: History  Smoking Status  . Never Smoker  Smokeless Tobacco  . Never Used    Labs: Recent Review Flowsheet Data    Labs for ITP Cardiac and Pulmonary Rehab Latest Ref Rng & Units 10/01/2015 12/23/2015 03/25/2016 03/26/2016 06/26/2016   Cholestrol 0 - 200 mg/dL 180 142 166 133 146   LDLCALC 0 - 99 mg/dL 101(H) 66 75 63 60   HDL >39.00 mg/dL 63.80 61.00 72 53 68.10   Trlycerides 0.0 - 149.0 mg/dL 77.0 78.0 96 87 87.0   Hemoglobin A1c 4.6 - 6.5 % 6.1 6.2 - 6.2(H) 6.5   TCO2 0 - 100 mmol/L - - 25 - -       Exercise Target Goals:    Exercise Program Goal: Individual exercise prescription set with THRR, safety & activity barriers. Participant demonstrates ability to understand and report RPE using BORG scale, to self-measure pulse accurately, and to acknowledge the importance of the exercise prescription.  Exercise Prescription Goal: Starting with aerobic activity 30 plus minutes a day, 3 days per week for initial exercise prescription. Provide home exercise prescription and guidelines that participant acknowledges understanding prior to discharge.  Activity Barriers & Risk Stratification:     Activity Barriers & Cardiac Risk Stratification - 04/27/16 1514      Activity Barriers & Cardiac Risk Stratification   Activity Barriers Arthritis;Back Problems;Joint Problems;Muscular Weakness;Shortness of Breath;Deconditioning;Balance Concerns;History of Falls  r knee pops, uses knee brace; limited ROM overhead   Cardiac Risk Stratification High       6 Minute Walk:     6 Minute Walk    Row Name 04/27/16 1513 07/17/16 1816       6 Minute Walk   Phase Initial Discharge    Distance 1255 feet 1475 feet    Distance % Change  - 18 %    Walk Time 6 minutes 6 minutes    # of Rest Breaks 0 0    MPH 2.37 2.79    METS 2.76 3.14    RPE 13 13    Perceived Dyspnea  4 2    VO2 Peak 6.62 9.2    Symptoms Yes (comment) Yes (comment)  knee and back pain 6 out of 10  Comments SOB, knee pain 3/10  -    Resting HR 58 bpm 63 bpm    Resting BP 126/74 94/50    Max Ex. HR 88 bpm 110 bpm    Max Ex. BP 128/74 146/54    2 Minute Post BP 122/66  -       Oxygen Initial Assessment:   Oxygen Re-Evaluation:   Oxygen Discharge (Final Oxygen Re-Evaluation):   Initial Exercise Prescription:     Initial Exercise Prescription - 04/27/16 1500      Date of Initial Exercise RX and Referring Provider   Date 04/27/16   Referring Provider Ida Rogue MD     NuStep   Level 1   SPM --  60-80 spm   Minutes 15   METs 2     REL-XR   Level 1   Watts --  speed 50   Minutes 15   METs 2     Track   Laps 20   Minutes 15   METs 1.92     Prescription Details   Frequency (times per week) 3   Duration Progress to 45 minutes of aerobic exercise without signs/symptoms of physical distress     Intensity   THRR 40-80% of Max Heartrate 92-126   Ratings of Perceived Exertion 11-15     Progression   Progression Continue to progress workloads to maintain intensity without signs/symptoms of physical distress.     Resistance Training   Training Prescription Yes   Weight 2 lbs   Reps 10-12      Perform Capillary Blood Glucose checks as needed.  Exercise Prescription Changes:     Exercise Prescription Changes    Row Name 04/27/16 1500 05/11/16 1100 05/26/16 1100 06/08/16 1100 06/08/16 1600     Response to Exercise   Blood Pressure (Admit) 126/74 106/62 118/54 132/64  -   Blood Pressure (Exercise) 128/74 132/60 124/54 128/60  -    Blood Pressure (Exit) 122/66 124/78 98/48 108/58  -   Heart Rate (Admit) 558 bpm 76 bpm 59 bpm 61 bpm  -   Heart Rate (Exercise) 88 bpm 94 bpm 94 bpm 104 bpm  -   Heart Rate (Exit) 57 bpm 73 bpm 66 bpm 60 bpm  -   Oxygen Saturation (Admit) 100 %  -  -  -  -   Oxygen Saturation (Exercise) 100 %  -  -  -  -   Rating of Perceived Exertion (Exercise) 13 13 14 14   -   Perceived Dyspnea (Exercise) 4  -  -  -  -   Symptoms SOB, knee pain 3/10  -  -  -  -   Duration  - Progress to 45 minutes of aerobic exercise without signs/symptoms of physical distress Progress to 45 minutes of aerobic exercise without signs/symptoms of physical distress Progress to 45 minutes of aerobic exercise without signs/symptoms of physical distress Progress to 45 minutes of aerobic exercise without signs/symptoms of physical distress   Intensity  - THRR unchanged THRR unchanged THRR unchanged THRR unchanged     Progression   Progression  - Continue to progress workloads to maintain intensity without signs/symptoms of physical distress. Continue to progress workloads to maintain intensity without signs/symptoms of physical distress. Continue to progress workloads to maintain intensity without signs/symptoms of physical distress. Continue to progress workloads to maintain intensity without signs/symptoms of physical distress.     Resistance Training   Training Prescription  - Yes Yes Yes Yes  Weight  - 2 2 2 2    Reps  - 10-12 10-12 10-12 10-12     Interval Training   Interval Training  - No No No No     REL-XR   Level  -  - 1  -  -   Minutes  -  - 15  -  -   METs  -  - 4.5  -  -     Biostep-RELP   Level  - 1 1 1 1    Minutes  - 15 15 15 15    METs  -  -  - 2 2     Track   Laps  -  -  - 45 45   Minutes  - 15  - 15 15     Home Exercise Plan   Plans to continue exercise at  -  -  -  [employer] (comment)  will use wellness center in community    Frequency  -  -  -  - Add 2 additional days to program  exercise sessions.     Exercise Review   Progression -  Walk Test Results  -  -  -  -   Row Name 06/23/16 0900 07/06/16 1200           Response to Exercise   Blood Pressure (Admit) 106/60 102/68      Blood Pressure (Exercise) 124/74 122/68      Blood Pressure (Exit) 92/60 102/54      Heart Rate (Admit) 60 bpm 59 bpm      Heart Rate (Exercise) 102 bpm 102 bpm      Heart Rate (Exit) 94 bpm 56 bpm      Rating of Perceived Exertion (Exercise) 13 13      Duration Progress to 45 minutes of aerobic exercise without signs/symptoms of physical distress Progress to 45 minutes of aerobic exercise without signs/symptoms of physical distress      Intensity THRR unchanged THRR unchanged        Progression   Progression Continue to progress workloads to maintain intensity without signs/symptoms of physical distress. Continue to progress workloads to maintain intensity without signs/symptoms of physical distress.        Resistance Training   Training Prescription Yes Yes      Weight 2 2      Reps 10-12 10-15        Interval Training   Interval Training No  -        NuStep   Level 2  -      Minutes 15  -      METs 3.2  -        REL-XR   Level 2 2      Minutes 15 15      METs 8.8 3.9        Biostep-RELP   Level  - 1      Minutes  - 15      METs  - 2         Exercise Comments:     Exercise Comments    Row Name 04/27/16 1530 05/08/16 1810 05/11/16 1129 05/26/16 1201 06/08/16 1158   Exercise Comments Kiyara wants to build her endurance to be able to make the bed (short) and walk without SOB. First full day of exercise!  Patient was oriented to gym and equipment including functions, settings, policies, and procedures.  Patient's individual exercise prescription and treatment plan were  reviewed.  All starting workloads were established based on the results of the 6 minute walk test done at initial orientation visit.  The plan for exercise progression was also introduced and  progression will be customized based on patient's performance and goals. Fatoumata has done well in her first week of exercise. Hue is tolerating exercise very well. Briar is progressing well with exercise.   [place] Name 06/08/16 1650 06/22/16 1720 06/23/16 0933 07/06/16 1301     Exercise Comments Home exercise guidelines were reviewed with patinet. She plans to exercise at her wellness facility in her community 2 additional days outside of cardiac rehab class.  Gladynes back was bothering her so she did the Nustep instead of walking today. Sayre will continue NS instead of walking  until her back feels better. Tiffay has not reported any more back pain.  Continue with exercise as tolerated.       Exercise Goals and Review:   Exercise Goals Re-Evaluation :   Discharge Exercise Prescription (Final Exercise Prescription Changes):     Exercise Prescription Changes - 07/06/16 1200      Response to Exercise   Blood Pressure (Admit) 102/68   Blood Pressure (Exercise) 122/68   Blood Pressure (Exit) 102/54   Heart Rate (Admit) 59 bpm   Heart Rate (Exercise) 102 bpm   Heart Rate (Exit) 56 bpm   Rating of Perceived Exertion (Exercise) 13   Duration Progress to 45 minutes of aerobic exercise without signs/symptoms of physical distress   Intensity THRR unchanged     Progression   Progression Continue to progress workloads to maintain intensity without signs/symptoms of physical distress.     Resistance Training   Training Prescription Yes   Weight 2   Reps 10-15     REL-XR   Level 2   Minutes 15   METs 3.9     Biostep-RELP   Level 1   Minutes 15   METs 2      Nutrition:  Target Goals: Understanding of nutrition guidelines, daily intake of sodium <1559m, cholesterol <2067m calories 30% from fat and 7% or less from saturated fats, daily to have 5 or more servings of fruits and vegetables.  Biometrics:     Pre Biometrics - 04/27/16 1532      Pre Biometrics   Height 5'  0.9" (1.547 m)   Weight 176 lb 9.6 oz (80.1 kg)   Waist Circumference 36 inches   Hip Circumference 46 inches   Waist to Hip Ratio 0.78 %   BMI (Calculated) 33.5   Single Leg Stand 1.73 seconds         Post Biometrics - 07/17/16 1820       Post  Biometrics   Height 5' 2"  (1.575 m)   Weight 177 lb (80.3 kg)   Waist Circumference 36 inches   Hip Circumference 45.5 inches   Waist to Hip Ratio 0.79 %   BMI (Calculated) 32.4      Nutrition Therapy Plan and Nutrition Goals:     Nutrition Therapy & Goals - 05/15/16 1806      Nutrition Therapy   Drug/Food Interactions Statins/Certain Fruits     Personal Nutrition Goals   Comments GlDavionneill meet with the Cardiac REhab REgistered Dietician next week. [facility] that [facility] husband and daughter died. [facility]      Nutrition Discharge: Rate Your Plate Scores:     Nutrition Assessments -  04/27/16 1447      Rate Your Plate Scores   Pre Score 64   Pre Score % 73 %      Nutrition Goals Re-Evaluation:     Nutrition Goals Re-Evaluation    [place] Name 05/24/16 1909 06/07/16 1619           Goals   Comment Vondra said at [facility] they have hearts on the healthy meal items. Like today she got the chicken wrap with veggies.  To meet with Dietician next week. Danielly said she has cut back on her portions. and she eats vegetables. Esmerelda said she is going to take the 2 weeks meal list that [facility] gives her and ask them to mark the healthy food although she said she knows bascially which foods are healthy.        Personal Goal #1 Re-Evaluation   Goal Progress Seen Yes Yes         Nutrition Goals Discharge (Final Nutrition Goals Re-Evaluation):     Nutrition Goals Re-Evaluation - 06/07/16 1619      Goals   Comment Ramandeep said she is going to take the 2 weeks meal list that [facility] gives her and ask them to mark  the healthy food although she said she knows bascially which foods are healthy.     Personal Goal #1 Re-Evaluation   Goal Progress Seen Yes      Psychosocial: Target Goals: Acknowledge presence or absence of significant depression and/or stress, maximize coping skills, provide positive support system. Participant is able to verbalize types and ability to use techniques and skills needed for reducing stress and depression.   Initial Review & Psychosocial Screening:     Initial Psych Review & Screening - 04/27/16 1504      Initial Review   Current issues with --  pt voiced no concerns     Family Dynamics   Good Support System? Yes   Comments No psychosocial concerns were identified during medical review with Ms. Loyal     Barriers   Psychosocial barriers to participate in program There are no identifiable barriers or psychosocial needs.     Screening Interventions   Interventions Encouraged to exercise      Quality of Life Scores:      Quality of Life - 04/27/16 1538      Quality of Life Scores   Health/Function Pre 24.46 %   Socioeconomic Pre 30 %   Psych/Spiritual Pre 28.29 %   Family Pre 30 %   GLOBAL Pre 26.89 %      PHQ-9: Recent Review Flowsheet Data    Depression screen [facility] 2/9 04/27/2016 02/14/2016 05/14/2015 09/07/2014 02/11/2013   Decreased Interest 0 0 0 0 0   Down, Depressed, Hopeless 0 0 0 0 0   PHQ - 2 Score 0 0 0 0 0   Altered sleeping 0 - - - -   Tired, decreased energy 3 - - - -   Change in appetite 0 - - - -   Feeling bad or failure about yourself  0 - - - -   Trouble concentrating 0 - - - -   Moving slowly or fidgety/restless 0 - - - -   Suicidal thoughts 0 - - - -   PHQ-9 Score 3 - - - -   Difficult doing work/chores Somewhat difficult - - - -     Interpretation of Total Score  Total Score Depression Severity:  1-4 = Minimal  depression, 5-9 = Mild depression, 10-14 = Moderate depression, 15-19 = Moderately severe depression, 20-27 =  Severe depression   Psychosocial Evaluation and Intervention:     Psychosocial Evaluation - 05/15/16 1744      Psychosocial Evaluation & Interventions   Interventions Encouraged to exercise with the program and follow exercise prescription   Comments Counselor met with Ms. Guedes today for initial psychosocial evaluation.  She is a 76 year old who had a heart attack and two stents inserted late November.  She has a strong support system with a son who lives close by; active involvement in her local church, and lives in a retirement community with lots of friends who check in on her.  Ms. Chauncey Cruel states she sleeps "okay" even with her CPAP and gets approximately 6-7 hours average of sleep per night.  She has a good appetite and denies a history or current symptoms of depression or anxiety.  Mr. Chauncey Cruel states she is typically in a positive mood; although she became tearful when speaking of her daughter's death this past Sep 11, 2022.  Counselor encouraged a grief share group to help her with the grief she is experiencing in a supportive environment.  Ms. Chauncey Cruel has goals to lose weight and develop some muscle strength while in this program.  She plans to return to her retirement community gym upon completion of this program.  Staff will follow with Ms. Chauncey Cruel over the course of this program.        Psychosocial Re-Evaluation:     Psychosocial Re-Evaluation    Row Name 05/11/16 1332 05/15/16 1752 05/24/16 1904 05/31/16 1714 06/07/16 1622     Psychosocial Re-Evaluation   Comments Jalina called and said she is sorry that she can't attend Cardiac Rehab today since she is sick.  Victoire was given information card for the [facility] Woman's support group. Emyah said she missed her daughter who was always checking on her. Her daughter had a stroke and then heart problems. Teyah said she was told about Grief Group at [facility] but she is trying to get moved in totally into [place].  Counselor follow up with Ms. Keil today stating  she has close friends and family members telling her they've noticed her not struggling as much with shortness of breath when she talks.  She has noticed this some, but mentioned no change when she walks - still difficulty breathing then especially.  She see a Dr. in a few weeks for follow up and will be discussing with him her ongoing sleep issues as well - thinking it may be a negative side effect to one of the medications.  Her sleep continues to be interrupted and she has difficulty going back to sleep.  Counselor assessed Ms. Boursiquot's coping currently with the loss of her daughter less than a year ago, with her reporting her friends and family are checking in on her and taking her out to lunch or dinner quite often and this, along with just staing busy and trying "not to think" has been helpful to her during this time.  Counselor commended Ms. Reading for her progress and how well she is coping currently and will continue to follow with her throughout the course of this program.   Gittel is listing to the stress education lecture today given by Lucianne Lei, MSW.    Interventions  - Encouraged to attend Cardiac Rehabilitation for the exercise  -  - Encouraged to attend Cardiac Rehabilitation for the exercise   Continue Psychosocial  Services   - Yes  -  -  -   Row Name 06/19/16 1742             Psychosocial Re-Evaluation   Comments Counselor follow up with [facility] today stating her legs are feeling stronger since coming into this program and she is walking a little better.  Counselor commended Ms. S for her hard work; progress made and commitment to consistent exercise.            Psychosocial Discharge (Final Psychosocial Re-Evaluation):     Psychosocial Re-Evaluation - 06/19/16 1742      Psychosocial Re-Evaluation   Comments Counselor follow up with Beverlyn Roux today stating her legs are feeling stronger since coming into this program and she is walking a little better.  Counselor commended Ms.  S for her hard work; progress made and commitment to consistent exercise.        Vocational Rehabilitation: Provide vocational rehab assistance to qualifying candidates.   Vocational Rehab Evaluation & Intervention:     Vocational Rehab - 04/27/16 1519      Initial Vocational Rehab Evaluation & Intervention   Assessment shows need for Vocational Rehabilitation No      Education: Education Goals: Education classes will be provided on a weekly basis, covering required topics. Participant will state understanding/return demonstration of topics presented.  Learning Barriers/Preferences:     Learning Barriers/Preferences - 04/27/16 1518      Learning Barriers/Preferences   Learning Barriers None   Learning Preferences None      Education Topics: General Nutrition Guidelines/Fats and Fiber: -Group instruction provided by verbal, written material, models and posters to present the general guidelines for heart healthy nutrition. Gives an explanation and review of dietary fats and fiber.   Cardiac Rehab from 07/17/2016 in [facility] Cardiac and Pulmonary Rehab  Date  07/03/16  Educator  PI  Instruction Review Code  2- meets goals/outcomes      Controlling Sodium/Reading Food Labels: -Group verbal and written material supporting the discussion of sodium use in heart healthy nutrition. Review and explanation with models, verbal and written materials for utilization of the food label.   Cardiac Rehab from 07/17/2016 in [facility] Cardiac and Pulmonary Rehab  Date  05/15/16  Educator  PI  Instruction Review Code  2- meets goals/outcomes      Exercise Physiology & Risk Factors: - Group verbal and written instruction with models to review the exercise physiology of the cardiovascular system and associated critical values. Details cardiovascular disease risk factors and the goals associated with each risk factor.   Cardiac Rehab from 07/17/2016 in [facility] Cardiac and Pulmonary Rehab  Date   07/17/16  Educator  [facility]  Instruction Review Code  2- meets goals/outcomes      Aerobic Exercise & Resistance Training: - Gives group verbal and written discussion on the health impact of inactivity. On the components of aerobic and resistive training programs and the benefits of this training and how to safely progress through these programs.   Cardiac Rehab from 07/17/2016 in [facility] Cardiac and Pulmonary Rehab  Date  05/24/16  Educator  AS  Instruction Review Code  2- meets goals/outcomes      Flexibility, Balance, General Exercise Guidelines: - Provides group verbal and written instruction on the benefits of flexibility and balance training programs. Provides general exercise guidelines with specific guidelines to those with heart or lung disease. Demonstration and skill practice provided.   Stress Management: - Provides group verbal and written instruction about the  health risks of elevated stress, cause of high stress, and healthy ways to reduce stress.   Cardiac Rehab from 07/17/2016 in [facility] Cardiac and Pulmonary Rehab  Date  06/07/16  Educator  Lucianne Lei, MSW  Instruction Review Code  2- meets goals/outcomes      Depression: - Provides group verbal and written instruction on the correlation between heart/lung disease and depressed mood, treatment options, and the stigmas associated with seeking treatment.   Cardiac Rehab from 07/17/2016 in [facility] Cardiac and Pulmonary Rehab  Date  07/05/16  Educator  Lucianne Lei, MSW  Instruction Review Code  2- meets goals/outcomes      Anatomy & Physiology of the Heart: - Group verbal and written instruction and models provide basic cardiac anatomy and physiology, with the coronary electrical and arterial systems. Review of: AMI, Angina, Valve disease, Heart Failure, Cardiac Arrhythmia, Pacemakers, and the ICD.   Cardiac Rehab from 07/17/2016 in [facility] Cardiac and Pulmonary Rehab  Date  06/05/16  Educator  CE  Instruction Review Code  2-  meets goals/outcomes      Cardiac Procedures: - Group verbal and written instruction and models to describe the testing methods done to diagnose heart disease. Reviews the outcomes of the test results. Describes the treatment choices: Medical Management, Angioplasty, or Coronary Bypass Surgery.   Cardiac Medications: - Group verbal and written instruction to review commonly prescribed medications for heart disease. Reviews the medication, class of the drug, and side effects. Includes the steps to properly store meds and maintain the prescription regimen.   Cardiac Rehab from 07/17/2016 in [facility] Cardiac and Pulmonary Rehab  Date  06/19/16  Educator  SB  Instruction Review Code  2- meets goals/outcomes      Go Sex-Intimacy & Heart Disease, Get SMART - Goal Setting: - Group verbal and written instruction through game format to discuss heart disease and the return to sexual intimacy. Provides group verbal and written material to discuss and apply goal setting through the application of the S.M.A.R.T. Method.   Other Matters of the Heart: - Provides group verbal, written materials and models to describe Heart Failure, Angina, Valve Disease, and Diabetes in the realm of heart disease. Includes description of the disease process and treatment options available to the cardiac patient.   Cardiac Rehab from 07/17/2016 in [facility] Cardiac and Pulmonary Rehab  Date  06/05/16  Educator  CE  Instruction Review Code  2- meets goals/outcomes      Exercise & Equipment Safety: - Individual verbal instruction and demonstration of equipment use and safety with use of the equipment.   Cardiac Rehab from 07/17/2016 in [facility] Cardiac and Pulmonary Rehab  Date  04/27/16  Educator  SB  Instruction Review Code  1- partially meets, needs review/practice      Infection Prevention: - Provides verbal and written material to individual with discussion of infection control including proper hand washing and proper  equipment cleaning during exercise session.   Cardiac Rehab from 07/17/2016 in [facility] Cardiac and Pulmonary Rehab  Date  04/27/16  Educator  SB  Instruction Review Code  2- meets goals/outcomes      Falls Prevention: - Provides verbal and written material to individual with discussion of falls prevention and safety.   Cardiac Rehab from 07/17/2016 in [facility] Cardiac and Pulmonary Rehab  Date  04/27/16  Educator  SB  Instruction Review Code  2- meets goals/outcomes      Diabetes: - Individual verbal and written instruction to review signs/symptoms of diabetes, desired  ranges of glucose level fasting, after meals and with exercise. Advice that pre and post exercise glucose checks will be done for 3 sessions at entry of program.    Knowledge Questionnaire Score:     Knowledge Questionnaire Score - 04/27/16 1518      Knowledge Questionnaire Score   Pre Score 21/28      Core Components/Risk Factors/Patient Goals at Admission:     Personal Goals and Risk Factors at Admission - 05/15/16 1741      Core Components/Risk Factors/Patient Goals on Admission    Weight Management Yes      Core Components/Risk Factors/Patient Goals Review:      Goals and Risk Factor Review    Row Name 05/15/16 1748 05/15/16 1750 05/15/16 1803 05/24/16 1906 05/24/16 1911     Core Components/Risk Factors/Patient Goals Review   Personal Goals Review Weight Management/Obesity  -  - Sedentary;Increase Strength and Stamina  -   Review Riah admits to gaining weight since being in Cardiac REhab but she said she had her heart attack when she was suppose to move to [facility] so she just had them bring her bed and tv. Orlinda Blalock said her daughter died a couple of years ago of heart problems and her husband died 3 years ago.  Glaydne's blood pressure after exercising for 10-15 minutes on the recumbent elliptical was 140/80.  Asia has an appt with the Cardiac Rehab Registered Dietician next week. Ardyn said she  has gained weight since she has been here but has a lot of stressors going on.  Caelan was able to walk around the gym today but had to stop after so many laps due to her hip/back hurt at times. Foster said she is still adjusting to [facility] retirment facility. Larrie said she is to see the Registered dietiican on Feb 7th and was suppse to see Dietican today but dietician is sick todya.  Odeal said she has never smoked but around her 64 year old aunt who smokes and others. Her 55 year old aunt is a inspiration in the fact that she is still exercising twice a week and has exercised all her life.    Expected Outcomes  - Improve to have a heart healthy lifestyle.  Heart healthy lifestyle Heart healthy living.   -   Row Name 06/07/16 1621 07/13/16 1710           Core Components/Risk Factors/Patient Goals Review   Personal Goals Review  - Weight Management/Obesity;Improve shortness of breath with ADL's;Hypertension      Review Today Levenia's weight is 277 and she has not lost weight. Arizona Constable has just met with the dietician. She is 33 years old she said.  Youlanda Mighty has lost 5 lb by decreasing serving sizes and eating more vegetables.  Her BP has been low enough her Dr decreased metoprolol.  She still has some shortness of breath but feels her legs are getting stronger.      Expected Outcomes Heart healither living Ghislaine will continue her eating habits and see more weight loss. She will continue to exercise and SOB will get better.           Core Components/Risk Factors/Patient Goals at Discharge (Final Review):      Goals and Risk Factor Review - 07/13/16 1710      Core Components/Risk Factors/Patient Goals Review   Personal Goals Review Weight Management/Obesity;Improve shortness of breath with ADL's;Hypertension   Review Youlanda Mighty has lost 5 lb by decreasing serving sizes and eating more  vegetables.  Her BP has been low enough her Dr decreased metoprolol.  She still has some shortness of breath  but feels her legs are getting stronger.   Expected Outcomes Mahogany will continue her eating habits and see more weight loss. She will continue to exercise and SOB will get better.        ITP Comments:     ITP Comments    Row Name 04/27/16 1415 05/02/16 0655 05/11/16 1332 05/31/16 0652 06/28/16 0636   ITP Comments Medical review was completed today.  Initial ITP created during medical review.  Diagnosis documented in Discharge note from 03/25/2016 in Epic. 30 day review. Continue with ITP unless directed changes per Medical Director review. New to program Deoni called and said she is sorry that she can't attend Cardiac Rehab today since she is sick.  30 day review. Continue with ITP unless directed changes per Medical Director review. New to program 30 day review. Continue with ITP unless directed changes per Medical Director review      Comments: Ready for discharge.

## 2016-07-17 NOTE — Progress Notes (Addendum)
Daily Session Note  Patient Details  Name: April [name] MRN: [id number] Date of Birth: January 29, 1941 Referring Provider:     Cardiac Rehab from 04/27/2016 in [facility] Cardiac and Pulmonary Rehab  Referring Provider  Ida Rogue MD      Encounter Date: 07/17/2016  Check In:     Session Check In - 07/17/16 1635      Check-In   Location [facility]-Cardiac & Pulmonary Rehab   Staff Present Heath Lark, RN, BSN, CCRP;Lorena Clearman, RN, Moises Blood, BS, ACSM CEP, Exercise Physiologist   Supervising physician immediately available to respond to emergencies See telemetry face sheet for immediately available ER MD   Medication changes reported     No   Fall or balance concerns reported    No   Warm-up and Cool-down Performed on first and last piece of equipment   Resistance Training Performed Yes   VAD Patient? No     Pain Assessment   Currently in Pain? No/denies         History  Smoking Status  . Never Smoker  Smokeless Tobacco  . Never Used    Goals Met:  Proper associated with RPD/PD & O2 Sat Exercise tolerated well  Goals Unmet:  Not Applicable  Comments: Pt able to follow exercise prescription today without complaint.  Will continue to monitor for progression.     [place] Name 04/27/16 1513 07/17/16 1816       6 Minute Walk   Phase Initial Discharge    Distance 1255 feet 1475 feet    Distance % Change  - 18 %    Walk Time 6 minutes 6 minutes    # of Rest Breaks 0 0    MPH 2.37 2.79    METS 2.76 3.14    RPE 13 13    Perceived Dyspnea  4 2    VO2 Peak 6.62 9.2    Symptoms Yes (comment) Yes (comment)  knee and back pain 6 out of 10    Comments SOB, knee pain 3/10  -    Resting HR 58 bpm 63 bpm    Resting BP 126/74 94/50    Max Ex. HR 88 bpm 110 bpm    Max Ex. BP 128/74 146/54    2 Minute Post BP 122/66  -          Dr. Emily Filbert is Medical Director for [facility].

## 2016-07-17 NOTE — Telephone Encounter (Signed)
Pt coming in for labs on Wednesday, please place future orders. Thank you.

## 2016-07-17 NOTE — Patient Instructions (Signed)
Discharge Instructions  Patient Details  Name: April [name] MRN: [id number] Date of Birth: [birth year] Referring Provider:  Minna Merritts, MD   Number of Visits: 24  Reason for Discharge:  Patient reached a stable level of exercise. Patient independent in their exercise.  Smoking History:  History  Smoking Status  . Never Smoker  Smokeless Tobacco  . Never Used    Diagnosis:  ST elevation myocardial infarction involving right coronary artery [facility])  Initial Exercise Prescription:     Initial Exercise Prescription - 04/27/16 1500      Date of Initial Exercise RX and Referring Provider   Date 04/27/16   Referring Provider Ida Rogue MD     NuStep   Level 1   SPM --  60-80 spm   Minutes 15   METs 2     REL-XR   Level 1   Watts --  speed 50   Minutes 15   METs 2     Track   Laps 20   Minutes 15   METs 1.92     Prescription Details   Frequency (times per week) 3   Duration Progress to 45 minutes of aerobic exercise without signs/symptoms of physical distress     Intensity   THRR 40-80% of Max Heartrate 92-126   Ratings of Perceived Exertion 11-15     Progression   Progression Continue to progress workloads to maintain intensity without signs/symptoms of physical distress.     Resistance Training   Training Prescription Yes   Weight 2 lbs   Reps 10-12      Discharge Exercise Prescription (Final Exercise Prescription Changes):     Exercise Prescription Changes - 07/06/16 1200      Response to Exercise   Blood Pressure (Admit) 102/68   Blood Pressure (Exercise) 122/68   Blood Pressure (Exit) 102/54   Heart Rate (Admit) 59 bpm   Heart Rate (Exercise) 102 bpm   Heart Rate (Exit) 56 bpm   Rating of Perceived Exertion (Exercise) 13   Duration Progress to 45 minutes of aerobic exercise without signs/symptoms of physical distress   Intensity THRR unchanged     Progression   Progression Continue to progress workloads to maintain  intensity without signs/symptoms of physical distress.     Resistance Training   Training Prescription Yes   Weight 2   Reps 10-15     REL-XR   Level 2   Minutes 15   METs 3.9     Biostep-RELP   Level 1   Minutes 15   METs 2      Functional Capacity:     6 Minute Walk    Row Name 04/27/16 1513 07/17/16 1816       6 Minute Walk   Phase Initial Discharge    Distance 1255 feet 1475 feet    Distance % Change  - 18 %    Walk Time 6 minutes 6 minutes    # of Rest Breaks 0 0    MPH 2.37 2.79    METS 2.76 3.14    RPE 13 13    Perceived Dyspnea  4 2    VO2 Peak 6.62 9.2    Symptoms Yes (comment) Yes (comment)  knee and back pain 6 out of 10    Comments SOB, knee pain 3/10  -    Resting HR 58 bpm 63 bpm    Resting BP 126/74 94/50    Max Ex. HR 88 bpm 110 bpm  Max Ex. BP 128/74 146/54    2 Minute Post BP 122/66  -       Quality of Life:     Quality of Life - 04/27/16 1538      Quality of Life Scores   Health/Function Pre 24.46 %   Socioeconomic Pre 30 %   Psych/Spiritual Pre 28.29 %   Family Pre 30 %   GLOBAL Pre 26.89 %      Personal Goals: Goals established at orientation with interventions provided to work toward goal.     Personal Goals and Risk Factors at Admission - 05/15/16 1741      Core Components/Risk Factors/Patient Goals on Admission    Weight Management Yes       Personal Goals Discharge:     Goals and Risk Factor Review - 07/13/16 1710      Core Components/Risk Factors/Patient Goals Review   Personal Goals Review Weight Management/Obesity;Improve shortness of breath with ADL's;Hypertension   Review April [name] has lost 5 lb by decreasing serving sizes and eating more vegetables.  Her BP has been low enough her Dr decreased metoprolol.  She still has some shortness of breath but feels her legs are getting stronger.   Expected Outcomes April [name] will continue her eating habits and see more weight loss. She will continue to exercise and SOB  will get better.        Nutrition & Weight - Outcomes:     Pre Biometrics - 04/27/16 1532      Pre Biometrics   Height 5' 0.9" (1.547 m)   Weight 176 lb 9.6 oz (80.1 kg)   Waist Circumference 36 inches   Hip Circumference 46 inches   Waist to Hip Ratio 0.78 %   BMI (Calculated) 33.5   Single Leg Stand 1.73 seconds         Post Biometrics - 07/17/16 1820       Post  Biometrics   Height 5\' 2"  (1.575 m)   Weight 177 lb (80.3 kg)   Waist Circumference 36 inches   Hip Circumference 45.5 inches   Waist to Hip Ratio 0.79 %   BMI (Calculated) 32.4      Nutrition:     Nutrition Therapy & Goals - 05/15/16 1806      Nutrition Therapy   Drug/Food Interactions Statins/Certain Fruits     Personal Nutrition Goals   Comments April [name] will meet with the Cardiac REhab REgistered Dietician next week. April [name] is living alone at [facility] now that her husband and daughter died. April [name] can choose from several entrees at [facility].       Nutrition Discharge:     Nutrition Assessments - 04/27/16 1447      Rate Your Plate Scores   Pre Score 64   Pre Score % 73 %      Education Questionnaire Score:     Knowledge Questionnaire Score - 04/27/16 1518      Knowledge Questionnaire Score   Pre Score 21/28      Goals reviewed with patient; copy given to patient.

## 2016-07-18 ENCOUNTER — Other Ambulatory Visit: Payer: Self-pay | Admitting: Internal Medicine

## 2016-07-18 DIAGNOSIS — E876 Hypokalemia: Secondary | ICD-10-CM

## 2016-07-18 NOTE — Progress Notes (Signed)
Order placed for f/u met b 

## 2016-07-18 NOTE — Telephone Encounter (Signed)
Order placed for f/u met b.  

## 2016-07-19 ENCOUNTER — Other Ambulatory Visit: Payer: Medicare Other

## 2016-07-19 DIAGNOSIS — Z955 Presence of coronary angioplasty implant and graft: Secondary | ICD-10-CM

## 2016-07-19 DIAGNOSIS — I213 ST elevation (STEMI) myocardial infarction of unspecified site: Secondary | ICD-10-CM | POA: Diagnosis not present

## 2016-07-19 DIAGNOSIS — I2111 ST elevation (STEMI) myocardial infarction involving right coronary artery: Secondary | ICD-10-CM

## 2016-07-19 NOTE — Progress Notes (Signed)
Discharge Summary  Patient Details  Name: April [name] MRN: [id number] Date of Birth: [birth year] Referring Provider:     Cardiac Rehab from 04/27/2016 in [facility] Cardiac and Pulmonary Rehab  Referring Provider  Ida Rogue MD       Number of Visits: 36  Reason for Discharge:  Patient reached a stable level of exercise. Patient independent in their exercise.  Smoking History:  History  Smoking Status  . Never Smoker  Smokeless Tobacco  . Never Used    Diagnosis:  ST elevation myocardial infarction involving right coronary artery (HCC)  Status post coronary artery stent placement  ADL UCSD:   Initial Exercise Prescription:     Initial Exercise Prescription - 04/27/16 1500      Date of Initial Exercise RX and Referring Provider   Date 04/27/16   Referring Provider Ida Rogue MD     NuStep   Level 1   SPM --  60-80 spm   Minutes 15   METs 2     REL-XR   Level 1   Watts --  speed 50   Minutes 15   METs 2     Track   Laps 20   Minutes 15   METs 1.92     Prescription Details   Frequency (times per week) 3   Duration Progress to 45 minutes of aerobic exercise without signs/symptoms of physical distress     Intensity   THRR 40-80% of Max Heartrate 92-126   Ratings of Perceived Exertion 11-15     Progression   Progression Continue to progress workloads to maintain intensity without signs/symptoms of physical distress.     Resistance Training   Training Prescription Yes   Weight 2 lbs   Reps 10-12      Discharge Exercise Prescription (Final Exercise Prescription Changes):     Exercise Prescription Changes - 07/06/16 1200      Response to Exercise   Blood Pressure (Admit) 102/68   Blood Pressure (Exercise) 122/68   Blood Pressure (Exit) 102/54   Heart Rate (Admit) 59 bpm   Heart Rate (Exercise) 102 bpm   Heart Rate (Exit) 56 bpm   Rating of Perceived Exertion (Exercise) 13   Duration Progress to 45 minutes of aerobic exercise  without signs/symptoms of physical distress   Intensity THRR unchanged     Progression   Progression Continue to progress workloads to maintain intensity without signs/symptoms of physical distress.     Resistance Training   Training Prescription Yes   Weight 2   Reps 10-15     REL-XR   Level 2   Minutes 15   METs 3.9     Biostep-RELP   Level 1   Minutes 15   METs 2      Functional Capacity:     6 Minute Walk    Row Name 04/27/16 1513 07/17/16 1816       6 Minute Walk   Phase Initial Discharge    Distance 1255 feet 1475 feet    Distance % Change  - 18 %    Walk Time 6 minutes 6 minutes    # of Rest Breaks 0 0    MPH 2.37 2.79    METS 2.76 3.14    RPE 13 13    Perceived Dyspnea  4 2    VO2 Peak 6.62 9.2    Symptoms Yes (comment) Yes (comment)  knee and back pain 6 out of 10    Comments SOB,  knee pain 3/10  -    Resting HR 58 bpm 63 bpm    Resting BP 126/74 94/50    Max Ex. HR 88 bpm 110 bpm    Max Ex. BP 128/74 146/54    2 Minute Post BP 122/66  -       Psychological, QOL, Others - Outcomes: PHQ 2/9: Depression screen [facility] 2/9 07/18/2016 04/27/2016 02/14/2016 05/14/2015 09/07/2014  Decreased Interest 0 0 0 0 0  Down, Depressed, Hopeless 1 0 0 0 0  PHQ - 2 Score 1 0 0 0 0  Altered sleeping 1 0 - - -  Tired, decreased energy 2 3 - - -  Change in appetite 0 0 - - -  Feeling bad or failure about yourself  0 0 - - -  Trouble concentrating 0 0 - - -  Moving slowly or fidgety/restless 0 0 - - -  Suicidal thoughts 0 0 - - -  PHQ-9 Score 4 3 - - -  Difficult doing work/chores Not difficult at all Somewhat difficult - - -    Quality of Life:     Quality of Life - 07/18/16 1127      Quality of Life Scores   Health/Function Pre 24.46 %   Health/Function Post 23.5 %   Health/Function % Change -3.92 %   Socioeconomic Pre 30 %   Socioeconomic Post 27.75 %   Socioeconomic % Change  -7.5 %   Psych/Spiritual Pre 28.29 %   Psych/Spiritual Post 27.21 %    Psych/Spiritual % Change -3.82 %   Family Pre 30 %   Family Post 28 %   Family % Change -6.67 %   GLOBAL Pre 26.89 %   GLOBAL Post 25.82 %   GLOBAL % Change -3.98 %      Personal Goals: Goals established at orientation with interventions provided to work toward goal.     Personal Goals and Risk Factors at Admission - 05/15/16 1741      Core Components/Risk Factors/Patient Goals on Admission    Weight Management Yes       Personal Goals Discharge:     Goals and Risk Factor Review    Row Name 05/15/16 1748 05/15/16 1750 05/15/16 1803 05/24/16 1906 05/24/16 1911     Core Components/Risk Factors/Patient Goals Review   Personal Goals Review Weight Management/Obesity  -  - Sedentary;Increase Strength and Stamina  -   Review April [name] admits to gaining weight since being in Cardiac REhab but she said she had her heart attack when she was suppose to move to [facility] so she just had them bring her bed and tv. April [name] said her daughter died a couple of years ago of heart problems and her husband died 3 years ago.  April [name]'s blood pressure after exercising for 10-15 minutes on the recumbent elliptical was 140/80.  April [name] has an appt with the Cardiac Rehab Registered Dietician next week. April [name] said she has gained weight since she has been here but has a lot of stressors going on.  April [name] was able to walk around the gym today but had to stop after so many laps due to her hip/back hurt at times. April [name] said she is still adjusting to [facility] retirment facility. April [name] said she is to see the Registered dietiican on Feb 7th and was suppse to see Dietican today but dietician is sick todya.  April [name] said she has never smoked but around her 17 year old aunt who smokes and others. Her 94 year  old aunt is a inspiration in the fact that she is still exercising twice a week and has exercised all her life.    Expected Outcomes  - Improve to have a heart healthy lifestyle.  Heart healthy lifestyle Heart  healthy living.   -   Row Name 06/07/16 1621 07/13/16 1710 07/19/16 1848         Core Components/Risk Factors/Patient Goals Review   Personal Goals Review  - Weight Management/Obesity;Improve shortness of breath with ADL's;Hypertension  -     Review Today April [name]'s weight is 277 and she has not lost weight. April [name] has just met with the dietician. She is 76 years old she said.  April [name] has lost 5 lb by decreasing serving sizes and eating more vegetables.  Her BP has been low enough her Dr decreased metoprolol.  She still has some shortness of breath but feels her legs are getting stronger. April [name] completed 36/36 sessions of Cardiac Rehab today. She will exercise at the [employer] where she lives after this. April [name] increased her 6 minute walk by 262fet or 18%!!     Expected Outcomes Heart healither living April [name] continue her eating habits and see more weight loss. She will continue to exercise and SOB will get better.    -        Nutrition & Weight - Outcomes:     Pre Biometrics - 04/27/16 1532      Pre Biometrics   Height 5' 0.9" (1.547 m)   Weight 176 lb 9.6 oz (80.1 kg)   Waist Circumference 36 inches   Hip Circumference 46 inches   Waist to Hip Ratio 0.78 %   BMI (Calculated) 33.5   Single Leg Stand 1.73 seconds         Post Biometrics - 07/17/16 1820       Post  Biometrics   Height 5' 2"  (1.575 m)   Weight 177 lb (80.3 kg)   Waist Circumference 36 inches   Hip Circumference 45.5 inches   Waist to Hip Ratio 0.79 %   BMI (Calculated) 32.4      Nutrition:     Nutrition Therapy & Goals - 05/15/16 1806      Nutrition Therapy   Drug/Food Interactions Statins/Certain Fruits     Personal Nutrition Goals   Comments April [name] meet with the Cardiac REhab REgistered Dietician next week. [name]       Nutrition Discharge:      Nutrition Assessments - 07/18/16 1127      Rate Your Plate Scores   Pre Score 64   Pre Score % 73 %   Post Score 66   Post Score % 78.5 %   % Change 5.5 %      Education Questionnaire Score:     Knowledge Questionnaire Score - 07/18/16 1127      Knowledge Questionnaire Score   Pre Score 21/28   Post Score 27/28      Goals reviewed with patient; copy given to patient.

## 2016-07-19 NOTE — Progress Notes (Signed)
Cardiac Individual Treatment Plan  Patient Details  Name: April [name] MRN: [id number] Date of Birth: March 16, 1941 Referring Provider:     Cardiac Rehab from 04/27/2016 in [place]  Referring Provider  April Rogue MD      Initial Encounter Date:    Cardiac Rehab from 04/27/2016 in [facility] Cardiac and Pulmonary Rehab  Date  04/27/16  Referring Provider  April Rogue MD      Visit Diagnosis: ST elevation myocardial infarction involving right coronary artery [name])  Status post coronary artery stent placement  Patient's Home Medications on Admission:  Current Outpatient Prescriptions:  .  acetaminophen (TYLENOL) 500 MG tablet, Take 500 mg by mouth daily as needed for headache (pain)., Disp: , Rfl:  .  albuterol (PROVENTIL HFA;VENTOLIN HFA) 108 (90 Base) MCG/ACT inhaler, Inhale 2 puffs into the lungs every 4 (four) hours as needed for wheezing or shortness of breath., Disp: 1 Inhaler, Rfl: 6 .  amiodarone (PACERONE) 200 MG tablet, Take 1 tablet (200 mg total) by mouth daily., Disp: 30 tablet, Rfl: 6 .  amLODipine (NORVASC) 2.5 MG tablet, Take 1 tablet (2.5 mg total) by mouth daily., Disp: 30 tablet, Rfl: 6 .  apixaban (ELIQUIS) 5 MG TABS tablet, Take 1 tablet (5 mg total) by mouth 2 (two) times daily., Disp: 60 tablet, Rfl: 6 .  calcium carbonate (TUMS - DOSED IN MG ELEMENTAL CALCIUM) 500 MG chewable tablet, Chew 1-3 tablets by mouth daily as needed for indigestion or heartburn. , Disp: , Rfl:  .  cholecalciferol (VITAMIN D) 1000 units tablet, Take 1,000 Units by mouth 2 (two) times daily., Disp: , Rfl:  .  clopidogrel (PLAVIX) 75 MG tablet, Take 1 tablet (75 mg total) by mouth daily., Disp: 30 tablet, Rfl: 6 .  fish oil-omega-3 fatty acids 1000 MG capsule, Take 1 g by mouth 2 (two) times daily. , Disp: , Rfl:  .  fluticasone (FLONASE) 50 MCG/ACT nasal spray, Place 1-2 sprays into both nostrils at bedtime. , Disp: , Rfl:  .  furosemide (LASIX) 20 MG tablet,  Take 1 tablet (20 mg total) by mouth daily., Disp: 30 tablet, Rfl: 6 .  losartan-hydrochlorothiazide (HYZAAR) 100-12.5 MG tablet, TAKE ONE (1) TABLET EACH DAY, Disp: 30 tablet, Rfl: 3 .  meloxicam (MOBIC) 15 MG tablet, Take 15 mg by mouth daily as needed for pain. , Disp: , Rfl:  .  Menthol, Topical Analgesic, (BIOFREEZE EX), Apply 1 application topically daily as needed (pain)., Disp: , Rfl:  .  metoprolol (LOPRESSOR) 50 MG tablet, Take 1 tablet (50 mg total) by mouth 2 (two) times daily., Disp: 180 tablet, Rfl: 3 .  Misc Natural Products (OSTEO BI-FLEX JOINT SHIELD PO), Take 1 tablet by mouth 2 (two) times daily. , Disp: , Rfl:  .  Multiple Vitamin (MULTIVITAMIN WITH MINERALS) TABS tablet, Take 0.5 tablets by mouth 2 (two) times daily., Disp: , Rfl:  .  nitroGLYCERIN (NITROSTAT) 0.4 MG SL tablet, Place 1 tablet (0.4 mg total) under the tongue every 5 (five) minutes as needed., Disp: 25 tablet, Rfl: 3 .  pantoprazole (PROTONIX) 40 MG tablet, Take 1 tablet (40 mg total) by mouth daily., Disp: 30 tablet, Rfl: 6 .  Polyethyl Glycol-Propyl Glycol (SYSTANE OP), Place 1 drop into both eyes daily as needed (dry eyes)., Disp: , Rfl:  .  potassium chloride (K-DUR) 10 MEQ tablet, Take 1 tablet (10 mEq total) by mouth daily as needed. (Patient taking differently: Take 10 mEq by mouth daily as needed (  with furosemide (lasix) dose). ), Disp: 30 tablet, Rfl: 11 .  PRESCRIPTION MEDICATION, Inhale into the lungs at bedtime. CPAP, Disp: , Rfl:  .  ranitidine (ZANTAC) 75 MG tablet, Take 75 mg by mouth 2 (two) times daily., Disp: , Rfl:  .  rosuvastatin (CRESTOR) 40 MG tablet, Take 1 tablet (40 mg total) by mouth daily., Disp: 30 tablet, Rfl: 6 .  traMADol (ULTRAM) 50 MG tablet, Take 50 mg by mouth daily as needed (pain)., Disp: , Rfl:   Past Medical History: Past Medical History:  Diagnosis Date  . Arrhythmia    history of an irregular heart beat  . CAD S/P PCI pRCA Promus DES 3.5 x 16 (4.1 mm), ostRPDA Promus  DES 2.5 x 12 (2.7 mm) 03/25/2016  . Diverticulitis   . Endometriosis   . Heart murmur    no significant valvular lesion noted on Echo  . Hiatal hernia   . History of colon polyps 08/1994  . Hypercholesterolemia   . Hypertension   . Migraines    history of migraines  . Osteoporosis   . Rheumatic fever   . Sleep apnea    CPAP  . ST elevation myocardial infarction (STEMI) of inferolateral wall, initial episode of care ([facility]) 03/25/2016  . Urine incontinence     Tobacco Use: History  Smoking Status  . Never Smoker  Smokeless Tobacco  . Never Used    Labs: Recent Review Flowsheet Data    Labs for ITP Cardiac and Pulmonary Rehab Latest Ref Rng & Units 10/01/2015 12/23/2015 03/25/2016 03/26/2016 06/26/2016   Cholestrol 0 - 200 mg/dL 180 142 166 133 146   LDLCALC 0 - 99 mg/dL 101(H) 66 75 63 60   HDL >39.00 mg/dL 63.80 61.00 72 53 68.10   Trlycerides 0.0 - 149.0 mg/dL 77.0 78.0 96 87 87.0   Hemoglobin A1c 4.6 - 6.5 % 6.1 6.2 - 6.2(H) 6.5   TCO2 0 - 100 mmol/L - - 25 - -       Exercise Target Goals:    Exercise Program Goal: Individual exercise prescription set with THRR, safety & activity barriers. Participant demonstrates ability to understand and report RPE using BORG scale, to self-measure pulse accurately, and to acknowledge the importance of the exercise prescription.  Exercise Prescription Goal: Starting with aerobic activity 30 plus minutes a day, 3 days per week for initial exercise prescription. Provide home exercise prescription and guidelines that participant acknowledges understanding prior to discharge.  Activity Barriers & Risk Stratification:     Activity Barriers & Cardiac Risk Stratification - 04/27/16 1514      Activity Barriers & Cardiac Risk Stratification   Activity Barriers Arthritis;Back Problems;Joint Problems;Muscular Weakness;Shortness of Breath;Deconditioning;Balance Concerns;History of Falls  r knee pops, uses knee brace; limited ROM overhead    Cardiac Risk Stratification High      6 Minute Walk:     6 Minute Walk    Row Name 04/27/16 1513 07/17/16 1816       6 Minute Walk   Phase Initial Discharge    Distance 1255 feet 1475 feet    Distance % Change  - 18 %    Walk Time 6 minutes 6 minutes    # of Rest Breaks 0 0    MPH 2.37 2.79    METS 2.76 3.14    RPE 13 13    Perceived Dyspnea  4 2    VO2 Peak 6.62 9.2    Symptoms Yes (comment) Yes (comment)  knee and  back pain 6 out of 10    Comments SOB, knee pain 3/10  -    Resting HR 58 bpm 63 bpm    Resting BP 126/74 94/50    Max Ex. HR 88 bpm 110 bpm    Max Ex. BP 128/74 146/54    2 Minute Post BP 122/66  -       Oxygen Initial Assessment:   Oxygen Re-Evaluation:   Oxygen Discharge (Final Oxygen Re-Evaluation):   Initial Exercise Prescription:     Initial Exercise Prescription - 04/27/16 1500      Date of Initial Exercise RX and Referring Provider   Date 04/27/16   Referring Provider April Rogue MD     NuStep   Level 1   SPM --  60-80 spm   Minutes 15   METs 2     REL-XR   Level 1   Watts --  speed 50   Minutes 15   METs 2     Track   Laps 20   Minutes 15   METs 1.92     Prescription Details   Frequency (times per week) 3   Duration Progress to 45 minutes of aerobic exercise without signs/symptoms of physical distress     Intensity   THRR 40-80% of Max Heartrate 92-126   Ratings of Perceived Exertion 11-15     Progression   Progression Continue to progress workloads to maintain intensity without signs/symptoms of physical distress.     Resistance Training   Training Prescription Yes   Weight 2 lbs   Reps 10-12      Perform Capillary Blood Glucose checks as needed.  Exercise Prescription Changes:     Exercise Prescription Changes    Row Name 04/27/16 1500 05/11/16 1100 05/26/16 1100 06/08/16 1100 06/08/16 1600     Response to Exercise   Blood Pressure (Admit) 126/74 106/62 118/54 132/64  -   Blood Pressure  (Exercise) 128/74 132/60 124/54 128/60  -   Blood Pressure (Exit) 122/66 124/78 98/48 108/58  -   Heart Rate (Admit) 558 bpm 76 bpm 59 bpm 61 bpm  -   Heart Rate (Exercise) 88 bpm 94 bpm 94 bpm 104 bpm  -   Heart Rate (Exit) 57 bpm 73 bpm 66 bpm 60 bpm  -   Oxygen Saturation (Admit) 100 %  -  -  -  -   Oxygen Saturation (Exercise) 100 %  -  -  -  -   Rating of Perceived Exertion (Exercise) _0 -   Perceived Dyspnea (Exercise) 4  -  -  -  -   Symptoms SOB, knee pain 3/10  -  -  -  -   Duration  - Progress to 45 minutes of aerobic exercise without signs/symptoms of physical distress Progress to 45 minutes of aerobic exercise without signs/symptoms of physical distress Progress to 45 minutes of aerobic exercise without signs/symptoms of physical distress Progress to 45 minutes of aerobic exercise without signs/symptoms of physical distress   Intensity  - THRR unchanged THRR unchanged THRR unchanged THRR unchanged     Progression   Progression  - Continue to progress workloads to maintain intensity without signs/symptoms of physical distress. Continue to progress workloads to maintain intensity without signs/symptoms of physical distress. Continue to progress workloads to maintain intensity without signs/symptoms of physical distress. Continue to progress workloads to maintain intensity without signs/symptoms of physical distress.     Resistance Training  Training Prescription  - Yes Yes Yes Yes   Weight  - _0 Reps  - 10-12 10-12 10-12 10-12     Interval Training   Interval Training  - No No No No     REL-XR   Level  -  - 1  -  -   Minutes  -  - 15  -  -   METs  -  - 4.5  -  -     Biostep-RELP   Level  - _1 Minutes  - _2 METs  -  -  - 2 2     Track   Laps  -  -  - 45 45   Minutes  - 15  - 15 15     Home Exercise Plan   Plans to continue exercise at  -  -  -  [employer] (comment)  will use wellness center in community    Frequency  -  -   -  - Add 2 additional days to program exercise sessions.     Exercise Review   Progression -  Walk Test Results  -  -  -  -   Row Name 06/23/16 0900 07/06/16 1200           Response to Exercise   Blood Pressure (Admit) 106/60 102/68      Blood Pressure (Exercise) 124/74 122/68      Blood Pressure (Exit) 92/60 102/54      Heart Rate (Admit) 60 bpm 59 bpm      Heart Rate (Exercise) 102 bpm 102 bpm      Heart Rate (Exit) 94 bpm 56 bpm      Rating of Perceived Exertion (Exercise) 13 13      Duration Progress to 45 minutes of aerobic exercise without signs/symptoms of physical distress Progress to 45 minutes of aerobic exercise without signs/symptoms of physical distress      Intensity THRR unchanged THRR unchanged        Progression   Progression Continue to progress workloads to maintain intensity without signs/symptoms of physical distress. Continue to progress workloads to maintain intensity without signs/symptoms of physical distress.        Resistance Training   Training Prescription Yes Yes      Weight 2 2      Reps 10-12 10-15        Interval Training   Interval Training No  -        NuStep   Level 2  -      Minutes 15  -      METs 3.2  -        REL-XR   Level 2 2      Minutes 15 15      METs 8.8 3.9        Biostep-RELP   Level  - 1      Minutes  - 15      METs  - 2         Exercise Comments:     Exercise Comments    Row Name 04/27/16 1530 05/08/16 1810 05/11/16 1129 05/26/16 1201 06/08/16 1158   Exercise Comments Merrit wants to build her endurance to be able to make the bed (short) and walk without SOB. First full day of exercise!  Patient was oriented to gym and equipment including functions, settings, policies, and  procedures.  Patient's individual exercise prescription and treatment plan were reviewed.  All starting workloads were established based on the results of the 6 minute walk test done at initial orientation visit.  The plan for exercise  progression was also introduced and progression will be customized based on patient's performance and goals. Maurisa has done well in her first week of exercise. Ophie is tolerating exercise very well. Princessa is progressing well with exercise.   [place] Name 06/08/16 1650 06/22/16 1720 06/23/16 0933 07/06/16 1301     Exercise Comments Home exercise guidelines were reviewed with patinet. She plans to exercise at her wellness facility in her community 2 additional days outside of cardiac rehab class.  Gladynes back was bothering her so she did the Nustep instead of walking today. [place] will continue NS instead of walking  until her back feels better. Kinslie has not reported any more back pain.  Continue with exercise as tolerated.       Exercise Goals and Review:   Exercise Goals Re-Evaluation :   Discharge Exercise Prescription (Final Exercise Prescription Changes):     Exercise Prescription Changes - 07/06/16 1200      Response to Exercise   Blood Pressure (Admit) 102/68   Blood Pressure (Exercise) 122/68   Blood Pressure (Exit) 102/54   Heart Rate (Admit) 59 bpm   Heart Rate (Exercise) 102 bpm   Heart Rate (Exit) 56 bpm   Rating of Perceived Exertion (Exercise) 13   Duration Progress to 45 minutes of aerobic exercise without signs/symptoms of physical distress   Intensity THRR unchanged     Progression   Progression Continue to progress workloads to maintain intensity without signs/symptoms of physical distress.     Resistance Training   Training Prescription Yes   Weight 2   Reps 10-15     REL-XR   Level 2   Minutes 15   METs 3.9     Biostep-RELP   Level 1   Minutes 15   METs 2      Nutrition:  Target Goals: Understanding of nutrition guidelines, daily intake of sodium <1571m, cholesterol <2066m calories 30% from fat and 7% or less from saturated fats, daily to have 5 or more servings of fruits and vegetables.  Biometrics:     Pre Biometrics - 04/27/16  1532      Pre Biometrics   Height 5' 0.9" (1.547 m)   Weight 176 lb 9.6 oz (80.1 kg)   Waist Circumference 36 inches   Hip Circumference 46 inches   Waist to Hip Ratio 0.78 %   BMI (Calculated) 33.5   Single Leg Stand 1.73 seconds         Post Biometrics - 07/17/16 1820       Post  Biometrics   Height 5' 2" (1.575 m)   Weight 177 lb (80.3 kg)   Waist Circumference 36 inches   Hip Circumference 45.5 inches   Waist to Hip Ratio 0.79 %   BMI (Calculated) 32.4      Nutrition Therapy Plan and Nutrition Goals:     Nutrition Therapy & Goals - 05/15/16 1806      Nutrition Therapy   Drug/Food Interactions Statins/Certain Fruits     Personal Nutrition Goals   Comments GlAvalonill meet with the Cardiac REhab REgistered Dietician next week. [facility] that [facility] husband and daughter died. [facility]      Nutrition Discharge: Rate  Your Plate Scores:     Nutrition Assessments - 07/18/16 1127      Rate Your Plate Scores   Pre Score 64   Pre Score % 73 %   Post Score 66   Post Score % 78.5 %   % Change 5.5 %      Nutrition Goals Re-Evaluation:     Nutrition Goals Re-Evaluation    Row Name 05/24/16 1909 06/07/16 1619           Goals   Comment Mabry said at [facility] they have hearts on the healthy meal items. Like today she got the chicken wrap with veggies.  To meet with Dietician next week. Fatimah said she has cut back on her portions. and she eats vegetables. Ronique said she is going to take the 2 weeks meal list that [facility] gives her and ask them to mark the healthy food although she said she knows bascially which foods are healthy.        Personal Goal #1 Re-Evaluation   Goal Progress Seen Yes Yes         Nutrition Goals Discharge (Final Nutrition Goals Re-Evaluation):     Nutrition Goals Re-Evaluation - 06/07/16 1619      Goals   Comment Josetta said she is  going to take the 2 weeks meal list that [facility] gives her and ask them to mark the healthy food although she said she knows bascially which foods are healthy.     Personal Goal #1 Re-Evaluation   Goal Progress Seen Yes      Psychosocial: Target Goals: Acknowledge presence or absence of significant depression and/or stress, maximize coping skills, provide positive support system. Participant is able to verbalize types and ability to use techniques and skills needed for reducing stress and depression.   Initial Review & Psychosocial Screening:     Initial Psych Review & Screening - 04/27/16 1504      Initial Review   Current issues with --  pt voiced no concerns     Family Dynamics   Good Support System? Yes   Comments No psychosocial concerns were identified during medical review with Ms. Forgue     Barriers   Psychosocial barriers to participate in program There are no identifiable barriers or psychosocial needs.     Screening Interventions   Interventions Encouraged to exercise      Quality of Life Scores:      Quality of Life - 07/18/16 1127      Quality of Life Scores   Health/Function Pre 24.46 %   Health/Function Post 23.5 %   Health/Function % Change -3.92 %   Socioeconomic Pre 30 %   Socioeconomic Post 27.75 %   Socioeconomic % Change  -7.5 %   Psych/Spiritual Pre 28.29 %   Psych/Spiritual Post 27.21 %   Psych/Spiritual % Change -3.82 %   Family Pre 30 %   Family Post 28 %   Family % Change -6.67 %   GLOBAL Pre 26.89 %   GLOBAL Post 25.82 %   GLOBAL % Change -3.98 %      PHQ-9: Recent Review Flowsheet Data    Depression screen [facility] 2/9 07/18/2016 04/27/2016 02/14/2016 05/14/2015 09/07/2014   Decreased Interest 0 0 0 0 0   Down, Depressed, Hopeless 1 0 0 0 0   PHQ - 2 Score 1 0 0 0 0   Altered sleeping 1 0 - - -   Tired, decreased energy 2  3 - - -   Change in appetite 0 0 - - -   Feeling bad or failure about yourself  0 0 - - -    Trouble concentrating 0 0 - - -   Moving slowly or fidgety/restless 0 0 - - -   Suicidal thoughts 0 0 - - -   PHQ-9 Score 4 3 - - -   Difficult doing work/chores Not difficult at all Somewhat difficult - - -     Interpretation of Total Score  Total Score Depression Severity:  1-4 = Minimal depression, 5-9 = Mild depression, 10-14 = Moderate depression, 15-19 = Moderately severe depression, 20-27 = Severe depression   Psychosocial Evaluation and Intervention:     Psychosocial Evaluation - 05/15/16 1744      Psychosocial Evaluation & Interventions   Interventions Encouraged to exercise with the program and follow exercise prescription   Comments Counselor met with Ms. Dines today for initial psychosocial evaluation.  She is a 76 year old who had a heart attack and two stents inserted late November.  She has a strong support system with a son who lives close by; active involvement in her local church, and lives in a retirement community with lots of friends who check in on her.  Ms. Chauncey Cruel states she sleeps "okay" even with her CPAP and gets approximately 6-7 hours average of sleep per night.  She has a good appetite and denies a history or current symptoms of depression or anxiety.  Mr. Chauncey Cruel states she is typically in a positive mood; although she became tearful when speaking of her daughter's death this past 09-10-2022.  Counselor encouraged a grief share group to help her with the grief she is experiencing in a supportive environment.  Ms. Chauncey Cruel has goals to lose weight and develop some muscle strength while in this program.  She plans to return to her retirement community gym upon completion of this program.  Staff will follow with Ms. Chauncey Cruel over the course of this program.        Psychosocial Re-Evaluation:     Psychosocial Re-Evaluation    Row Name 05/11/16 1332 05/15/16 1752 05/24/16 1904 05/31/16 1714 06/07/16 1622     Psychosocial Re-Evaluation   Comments Kearah called and said she is sorry that she  can't attend Cardiac Rehab today since she is sick.  Matayah was given information card for the [facility] support group. Estefanny said she missed her daughter who was always checking on her. Her daughter had a stroke and then heart problems. Taquisha said she was told about Grief Group at [facility] but she is trying to get moved in totally into [place].  Counselor follow up with Ms. Bhullar today stating she has close friends and family members telling her they've noticed her not struggling as much with shortness of breath when she talks.  She has noticed this some, but mentioned no change when she walks - still difficulty breathing then especially.  She see a Dr. in a few weeks for follow up and will be discussing with him her ongoing sleep issues as well - thinking it may be a negative side effect to one of the medications.  Her sleep continues to be interrupted and she has difficulty going back to sleep.  Counselor assessed Ms. Kann's coping currently with the loss of her daughter less than a year ago, with her reporting her friends and family are checking in on her and taking her out to lunch or  dinner quite often and this, along with just staing busy and trying "not to think" has been helpful to her during this time.  Counselor commended Ms. Ehmann for her progress and how well she is coping currently and will continue to follow with her throughout the course of this program.   Ligaya is listing to the stress education lecture today given by Lucianne Lei, MSW.    Interventions  - Encouraged to attend Cardiac Rehabilitation for the exercise  -  - Encouraged to attend Cardiac Rehabilitation for the exercise   Continue Psychosocial Services   - Yes  -  -  -   Row Name 06/19/16 1742 07/19/16 1848           Psychosocial Re-Evaluation   Comments Counselor follow up with Beverlyn Roux today stating her legs are feeling stronger since coming into this program and she is walking a little better.  Counselor  commended Ms. S for her hard work; progress made and commitment to consistent exercise.    -      Expected Outcomes  - Jocabed completed 36/36 sessions of Cardiac Rehab today. She will exercise at the [employer] where she lives after this. Zacaria says she has neighbors and friends who exercise there so she feels suported there.          Psychosocial Discharge (Final Psychosocial Re-Evaluation):     Psychosocial Re-Evaluation - 07/19/16 1848      Psychosocial Re-Evaluation   Expected Outcomes Leonardo completed 36/36 sessions of Cardiac Rehab today. She will exercise at the [employer] where she lives after this. Runell says she has neighbors and friends who exercise there so she feels suported there.       Vocational Rehabilitation: Provide vocational rehab assistance to qualifying candidates.   Vocational Rehab Evaluation & Intervention:     Vocational Rehab - 04/27/16 1519      Initial Vocational Rehab Evaluation & Intervention   Assessment shows need for Vocational Rehabilitation No      Education: Education Goals: Education classes will be provided on a weekly basis, covering required topics. Participant will state understanding/return demonstration of topics presented.  Learning Barriers/Preferences:     Learning Barriers/Preferences - 04/27/16 1518      Learning Barriers/Preferences   Learning Barriers None   Learning Preferences None      Education Topics: General Nutrition Guidelines/Fats and Fiber: -Group instruction provided by verbal, written material, models and posters to present the general guidelines for heart healthy nutrition. Gives an explanation and review of dietary fats and fiber.   Cardiac Rehab from 07/19/2016 in [facility] Cardiac and Pulmonary Rehab  Date  07/03/16  Educator  PI  Instruction Review Code  2- meets goals/outcomes      Controlling Sodium/Reading Food Labels: -Group verbal and written material supporting the  discussion of sodium use in heart healthy nutrition. Review and explanation with models, verbal and written materials for utilization of the food label.   Cardiac Rehab from 07/19/2016 in [facility] Cardiac and Pulmonary Rehab  Date  05/15/16  Educator  PI  Instruction Review Code  2- meets goals/outcomes      Exercise Physiology & Risk Factors: - Group verbal and written instruction with models to review the exercise physiology of the cardiovascular system and associated critical values. Details cardiovascular disease risk factors and the goals associated with each risk factor.   Cardiac Rehab from 07/19/2016 in [facility] Cardiac and Pulmonary Rehab  Date  07/17/16  Educator  [facility]  Instruction Review Code  2- meets goals/outcomes      Aerobic Exercise & Resistance Training: - Gives group verbal and written discussion on the health impact of inactivity. On the components of aerobic and resistive training programs and the benefits of this training and how to safely progress through these programs.   Cardiac Rehab from 07/19/2016 in [facility] Cardiac and Pulmonary Rehab  Date  07/19/16  Educator  AS  Instruction Review Code  2- meets goals/outcomes      Flexibility, Balance, General Exercise Guidelines: - Provides group verbal and written instruction on the benefits of flexibility and balance training programs. Provides general exercise guidelines with specific guidelines to those with heart or lung disease. Demonstration and skill practice provided.   Stress Management: - Provides group verbal and written instruction about the health risks of elevated stress, cause of high stress, and healthy ways to reduce stress.   Cardiac Rehab from 07/19/2016 in [facility] Cardiac and Pulmonary Rehab  Date  06/07/16  Educator  Lucianne Lei, MSW  Instruction Review Code  2- meets goals/outcomes      Depression: - Provides group verbal and written instruction on the correlation between heart/lung disease and depressed  mood, treatment options, and the stigmas associated with seeking treatment.   Cardiac Rehab from 07/19/2016 in [facility] Cardiac and Pulmonary Rehab  Date  07/05/16  Educator  Lucianne Lei, MSW  Instruction Review Code  2- meets goals/outcomes      Anatomy & Physiology of the Heart: - Group verbal and written instruction and models provide basic cardiac anatomy and physiology, with the coronary electrical and arterial systems. Review of: AMI, Angina, Valve disease, Heart Failure, Cardiac Arrhythmia, Pacemakers, and the ICD.   Cardiac Rehab from 07/19/2016 in [facility] Cardiac and Pulmonary Rehab  Date  06/05/16  Educator  CE  Instruction Review Code  2- meets goals/outcomes      Cardiac Procedures: - Group verbal and written instruction and models to describe the testing methods done to diagnose heart disease. Reviews the outcomes of the test results. Describes the treatment choices: Medical Management, Angioplasty, or Coronary Bypass Surgery.   Cardiac Medications: - Group verbal and written instruction to review commonly prescribed medications for heart disease. Reviews the medication, class of the drug, and side effects. Includes the steps to properly store meds and maintain the prescription regimen.   Cardiac Rehab from 07/19/2016 in [facility] Cardiac and Pulmonary Rehab  Date  06/19/16  Educator  SB  Instruction Review Code  2- meets goals/outcomes      Go Sex-Intimacy & Heart Disease, Get SMART - Goal Setting: - Group verbal and written instruction through game format to discuss heart disease and the return to sexual intimacy. Provides group verbal and written material to discuss and apply goal setting through the application of the S.M.A.R.T. Method.   Other Matters of the Heart: - Provides group verbal, written materials and models to describe Heart Failure, Angina, Valve Disease, and Diabetes in the realm of heart disease. Includes description of the disease process and treatment options  available to the cardiac patient.   Cardiac Rehab from 07/19/2016 in [facility] Cardiac and Pulmonary Rehab  Date  06/05/16  Educator  CE  Instruction Review Code  2- meets goals/outcomes      Exercise & Equipment Safety: - Individual verbal instruction and demonstration of equipment use and safety with use of the equipment.   Cardiac Rehab from 07/19/2016 in [facility] Cardiac and Pulmonary Rehab  Date  04/27/16  Educator  SB  Instruction Review Code  1- partially meets, needs review/practice      Infection Prevention: - Provides verbal and written material to individual with discussion of infection control including proper hand washing and proper equipment cleaning during exercise session.   Cardiac Rehab from 07/19/2016 in [facility] Cardiac and Pulmonary Rehab  Date  04/27/16  Educator  SB  Instruction Review Code  2- meets goals/outcomes      Falls Prevention: - Provides verbal and written material to individual with discussion of falls prevention and safety.   Cardiac Rehab from 07/19/2016 in [facility] Cardiac and Pulmonary Rehab  Date  04/27/16  Educator  SB  Instruction Review Code  2- meets goals/outcomes      Diabetes: - Individual verbal and written instruction to review signs/symptoms of diabetes, desired ranges of glucose level fasting, after meals and with exercise. Advice that pre and post exercise glucose checks will be done for 3 sessions at entry of program.    Knowledge Questionnaire Score:     Knowledge Questionnaire Score - 07/18/16 1127      Knowledge Questionnaire Score   Pre Score 21/28   Post Score 27/28      Core Components/Risk Factors/Patient Goals at Admission:     Personal Goals and Risk Factors at Admission - 05/15/16 1741      Core Components/Risk Factors/Patient Goals on Admission    Weight Management Yes      Core Components/Risk Factors/Patient Goals Review:      Goals and Risk Factor Review    Row Name 05/15/16 1748 05/15/16 1750 05/15/16 1803  05/24/16 1906 05/24/16 1911     Core Components/Risk Factors/Patient Goals Review   Personal Goals Review Weight Management/Obesity  -  - Sedentary;Increase Strength and Stamina  -   Review Randa admits to gaining weight since being in Cardiac REhab but she said she had her heart attack when she was suppose to move to [facility] so she just had them bring her bed and tv. Orlinda Blalock said her daughter died a couple of years ago of heart problems and her husband died 3 years ago.  Glaydne's blood pressure after exercising for 10-15 minutes on the recumbent elliptical was 140/80.  Philisha has an appt with the Cardiac Rehab Registered Dietician next week. Mell said she has gained weight since she has been here but has a lot of stressors going on.  Jaycey was able to walk around the gym today but had to stop after so many laps due to her hip/back hurt at times. Anahla said she is still adjusting to [facility] retirment facility. Laurelai said she is to see the Registered dietiican on Feb 7th and was suppse to see Dietican today but dietician is sick todya.  Onnika said she has never smoked but around her 64 year old aunt who smokes and others. Her 40 year old aunt is a inspiration in the fact that she is still exercising twice a week and has exercised all her life.    Expected Outcomes  - Improve to have a heart healthy lifestyle.  Heart healthy lifestyle Heart healthy living.   -   Row Name 06/07/16 1621 07/13/16 1710 07/19/16 1848         Core Components/Risk Factors/Patient Goals Review   Personal Goals Review  - Weight Management/Obesity;Improve shortness of breath with ADL's;Hypertension  -     Review Today Breelyn's weight is 277 and she has not lost weight. Arizona Constable has just met with the dietician. She is  43 years old she said.  Youlanda Mighty has lost 5 lb by decreasing serving sizes and eating more vegetables.  Her BP has been low enough her Dr decreased metoprolol.  She still has some shortness of breath  but feels her legs are getting stronger. Aarini completed 36/36 sessions of Cardiac Rehab today. She will exercise at the [employer] where she lives after this. Hermena increased her 6 minute walk by 212fet or 18%!!     Expected Outcomes Heart [facility] living [place] continue her eating habits and see more weight loss. She will continue to exercise and SOB will get better.    -        Core Components/Risk Factors/Patient Goals at Discharge (Final Review):      Goals and Risk Factor Review - 07/19/16 1848      Core Components/Risk Factors/Patient Goals Review   Review Aarian completed 36/36 sessions of Cardiac Rehab today. [facility]. Cherrill increased her 6 minute walk by 2045ft or 18%!!      ITP Comments:     ITP Comments    Row Name 04/27/16 1415 05/02/16 0655 05/11/16 1332 05/31/16 0652 06/28/16 0636   ITP Comments Medical review was completed today.  Initial ITP created during medical review.  Diagnosis documented in Discharge note from 03/25/2016 in Epic. 30 day review. Continue with ITP unless directed changes per Medical Director review. New to program Jenifer called and said she is sorry that she can't attend Cardiac Rehab today since she is sick.  30 day review. Continue with ITP unless directed changes per Medical Director review. New to program 30 day review. Continue with ITP unless directed changes per Medical Director review   Row Name 07/19/16 1847           ITP Comments Kattaleya completed 36/36 sessions of Cardiac Rehab today. [facility].           Comments: Rosalee completed 36/36 sessions of Cardiac Rehab today. [facility] [place] [facility] [place] [facility] after this.

## 2016-07-19 NOTE — Progress Notes (Signed)
Daily Session Note  Patient Details  Name: April [name] MRN: [id number] Date of Birth: 03/17/1941 Referring Provider:     Cardiac Rehab from 04/27/2016 in [facility] Cardiac and Pulmonary Rehab  Referring Provider  April Rogue MD      Encounter Date: 07/19/2016  Check In:     Session Check In - 07/19/16 1622      Check-In   Location [facility]-Cardiac & Pulmonary Rehab   Staff Present Gerlene Burdock, RN, BSN;Fergus Throne, DPT, Burlene Arnt, BA, ACSM CEP, Exercise Physiologist   Supervising physician immediately available to respond to emergencies See telemetry face sheet for immediately available ER MD   Medication changes reported     No   Fall or balance concerns reported    No   Tobacco Cessation No Change   Warm-up and Cool-down Performed on first and last piece of equipment   Resistance Training Performed Yes   VAD Patient? No     Pain Assessment   Currently in Pain? No/denies   Multiple Pain Sites No         History  Smoking Status  . Never Smoker  Smokeless Tobacco  . Never Used    Goals Met:  Independence with exercise equipment  Goals Unmet:  Not Applicable  Comments:  April [name] graduated today from cardiac rehab with 36 sessions completed.  Details of the patient's exercise prescription and what she  needs to do in order to continue the prescription and progress were discussed with patient.  Patient was given a copy of prescription and goals.  Patient verbalized understanding.  April [name] plans to continue to exercise by using gym and exercise classes at [facility] at [facility]    Dr. Emily [name] is Medical Director for [place] and [facility].

## 2016-07-20 ENCOUNTER — Other Ambulatory Visit (INDEPENDENT_AMBULATORY_CARE_PROVIDER_SITE_OTHER): Payer: Medicare Other

## 2016-07-20 ENCOUNTER — Telehealth: Payer: Self-pay | Admitting: Radiology

## 2016-07-20 ENCOUNTER — Telehealth: Payer: Self-pay | Admitting: Cardiovascular Disease

## 2016-07-20 DIAGNOSIS — E876 Hypokalemia: Secondary | ICD-10-CM | POA: Diagnosis not present

## 2016-07-20 LAB — BASIC METABOLIC PANEL
BUN: 18 mg/dL (ref 6–23)
CHLORIDE: 99 meq/L (ref 96–112)
CO2: 33 mEq/L — ABNORMAL HIGH (ref 19–32)
Calcium: 9.7 mg/dL (ref 8.4–10.5)
Creatinine, Ser: 1.18 mg/dL (ref 0.40–1.20)
GFR: 47.12 mL/min — ABNORMAL LOW (ref >60.00)
Glucose, Bld: 101 mg/dL — ABNORMAL HIGH (ref 70–99)
Potassium: 3.8 mEq/L (ref 3.5–5.1)
SODIUM: 139 meq/L (ref 135–145)

## 2016-07-20 NOTE — Telephone Encounter (Signed)
FYI. Patient would like her lab results to be mailed to her.

## 2016-07-20 NOTE — Telephone Encounter (Signed)
Noted  

## 2016-07-20 NOTE — Telephone Encounter (Signed)
Spoke w/ pt.  She would like to know if she is allowed [place]. She will be driving and stopping occasionally for breaks.  She will call her PCP for her recommendation, but would like the ok from Dr. Rockey Situ before she attempts to drive that far. Advised her that I will make Dr. Rockey Situ aware of her concerns and call her back w/ his recommendation.

## 2016-07-20 NOTE — Telephone Encounter (Signed)
Pt calling asking if she is allowed to drive about 3 hours here and there Please call back

## 2016-07-21 ENCOUNTER — Other Ambulatory Visit: Payer: Self-pay | Admitting: Internal Medicine

## 2016-07-21 DIAGNOSIS — R7989 Other specified abnormal findings of blood chemistry: Secondary | ICD-10-CM

## 2016-07-21 NOTE — Progress Notes (Signed)
Order placed for f/u met b 

## 2016-07-21 NOTE — Telephone Encounter (Signed)
I think that should be fine Would just make sure she feels confident driving on the freeways

## 2016-07-24 DIAGNOSIS — M9904 Segmental and somatic dysfunction of sacral region: Secondary | ICD-10-CM | POA: Diagnosis not present

## 2016-07-24 DIAGNOSIS — M5136 Other intervertebral disc degeneration, lumbar region: Secondary | ICD-10-CM | POA: Diagnosis not present

## 2016-07-24 DIAGNOSIS — M9903 Segmental and somatic dysfunction of lumbar region: Secondary | ICD-10-CM | POA: Diagnosis not present

## 2016-07-24 DIAGNOSIS — M5441 Lumbago with sciatica, right side: Secondary | ICD-10-CM | POA: Diagnosis not present

## 2016-07-24 NOTE — Telephone Encounter (Signed)
Left detailed message on pt's vm w/ Dr. Donivan Scull recommendaiton, per [facility]. Asked her to call back w/ any questions or concerns.

## 2016-07-26 ENCOUNTER — Encounter: Payer: Self-pay | Admitting: *Deleted

## 2016-07-26 DIAGNOSIS — Z955 Presence of coronary angioplasty implant and graft: Secondary | ICD-10-CM

## 2016-07-26 DIAGNOSIS — I2111 ST elevation (STEMI) myocardial infarction involving right coronary artery: Secondary | ICD-10-CM

## 2016-07-26 NOTE — Progress Notes (Signed)
Cardiac Individual Treatment Plan  Patient Details  Name: April [name] MRN: [id number] Date of Birth: March 16, 1941 Referring Provider:     Cardiac Rehab from 04/27/2016 in [place] [facility]  Referring Provider  Ida Rogue MD      Initial Encounter Date:    Cardiac Rehab from 04/27/2016 in [facility] Cardiac and Pulmonary Rehab  Date  04/27/16  Referring Provider  Ida Rogue MD      Visit Diagnosis: ST elevation myocardial infarction involving right coronary artery [name])  Status post coronary artery stent placement  Patient's Home Medications on Admission:  Current Outpatient Prescriptions:  .  acetaminophen (TYLENOL) 500 MG tablet, Take 500 mg by mouth daily as needed for headache (pain)., Disp: , Rfl:  .  albuterol (PROVENTIL HFA;VENTOLIN HFA) 108 (90 Base) MCG/ACT inhaler, Inhale 2 puffs into the lungs every 4 (four) hours as needed for wheezing or shortness of breath., Disp: 1 Inhaler, Rfl: 6 .  amiodarone (PACERONE) 200 MG tablet, Take 1 tablet (200 mg total) by mouth daily., Disp: 30 tablet, Rfl: 6 .  amLODipine (NORVASC) 2.5 MG tablet, Take 1 tablet (2.5 mg total) by mouth daily., Disp: 30 tablet, Rfl: 6 .  apixaban (ELIQUIS) 5 MG TABS tablet, Take 1 tablet (5 mg total) by mouth 2 (two) times daily., Disp: 60 tablet, Rfl: 6 .  calcium carbonate (TUMS - DOSED IN MG ELEMENTAL CALCIUM) 500 MG chewable tablet, Chew 1-3 tablets by mouth daily as needed for indigestion or heartburn. , Disp: , Rfl:  .  cholecalciferol (VITAMIN D) 1000 units tablet, Take 1,000 Units by mouth 2 (two) times daily., Disp: , Rfl:  .  clopidogrel (PLAVIX) 75 MG tablet, Take 1 tablet (75 mg total) by mouth daily., Disp: 30 tablet, Rfl: 6 .  fish oil-omega-3 fatty acids 1000 MG capsule, Take 1 g by mouth 2 (two) times daily. , Disp: , Rfl:  .  fluticasone (FLONASE) 50 MCG/ACT nasal spray, Place 1-2 sprays into both nostrils at bedtime. , Disp: , Rfl:  .  furosemide (LASIX) 20 MG tablet,  Take 1 tablet (20 mg total) by mouth daily., Disp: 30 tablet, Rfl: 6 .  losartan-hydrochlorothiazide (HYZAAR) 100-12.5 MG tablet, TAKE ONE (1) TABLET EACH DAY, Disp: 30 tablet, Rfl: 3 .  meloxicam (MOBIC) 15 MG tablet, Take 15 mg by mouth daily as needed for pain. , Disp: , Rfl:  .  Menthol, Topical Analgesic, (BIOFREEZE EX), Apply 1 application topically daily as needed (pain)., Disp: , Rfl:  .  metoprolol (LOPRESSOR) 50 MG tablet, Take 1 tablet (50 mg total) by mouth 2 (two) times daily., Disp: 180 tablet, Rfl: 3 .  Misc Natural Products (OSTEO BI-FLEX JOINT SHIELD PO), Take 1 tablet by mouth 2 (two) times daily. , Disp: , Rfl:  .  Multiple Vitamin (MULTIVITAMIN WITH MINERALS) TABS tablet, Take 0.5 tablets by mouth 2 (two) times daily., Disp: , Rfl:  .  nitroGLYCERIN (NITROSTAT) 0.4 MG SL tablet, Place 1 tablet (0.4 mg total) under the tongue every 5 (five) minutes as needed., Disp: 25 tablet, Rfl: 3 .  pantoprazole (PROTONIX) 40 MG tablet, Take 1 tablet (40 mg total) by mouth daily., Disp: 30 tablet, Rfl: 6 .  Polyethyl Glycol-Propyl Glycol (SYSTANE OP), Place 1 drop into both eyes daily as needed (dry eyes)., Disp: , Rfl:  .  potassium chloride (K-DUR) 10 MEQ tablet, Take 1 tablet (10 mEq total) by mouth daily as needed. (Patient taking differently: Take 10 mEq by mouth daily as needed (  with furosemide (lasix) dose). ), Disp: 30 tablet, Rfl: 11 .  PRESCRIPTION MEDICATION, Inhale into the lungs at bedtime. CPAP, Disp: , Rfl:  .  ranitidine (ZANTAC) 75 MG tablet, Take 75 mg by mouth 2 (two) times daily., Disp: , Rfl:  .  rosuvastatin (CRESTOR) 40 MG tablet, Take 1 tablet (40 mg total) by mouth daily., Disp: 30 tablet, Rfl: 6 .  traMADol (ULTRAM) 50 MG tablet, Take 50 mg by mouth daily as needed (pain)., Disp: , Rfl:   Past Medical History: Past Medical History:  Diagnosis Date  . Arrhythmia    history of an irregular heart beat  . CAD S/P PCI pRCA Promus DES 3.5 x 16 (4.1 mm), ostRPDA Promus  DES 2.5 x 12 (2.7 mm) 03/25/2016  . Diverticulitis   . Endometriosis   . Heart murmur    no significant valvular lesion noted on Echo  . Hiatal hernia   . History of colon polyps 08/1994  . Hypercholesterolemia   . Hypertension   . Migraines    history of migraines  . Osteoporosis   . Rheumatic fever   . Sleep apnea    CPAP  . ST elevation myocardial infarction (STEMI) of inferolateral wall, initial episode of care ([place]) 03/25/2016  . Urine incontinence     Tobacco Use: History  Smoking Status  . Never Smoker  Smokeless Tobacco  . Never Used    Labs: Recent Review Flowsheet Data    Labs for ITP Cardiac and Pulmonary Rehab Latest Ref Rng & Units 10/01/2015 12/23/2015 03/25/2016 03/26/2016 06/26/2016   Cholestrol 0 - 200 mg/dL 180 142 166 133 146   LDLCALC 0 - 99 mg/dL 101(H) 66 75 63 60   HDL >39.00 mg/dL 63.80 61.00 72 53 68.10   Trlycerides 0.0 - 149.0 mg/dL 77.0 78.0 96 87 87.0   Hemoglobin A1c 4.6 - 6.5 % 6.1 6.2 - 6.2(H) 6.5   TCO2 0 - 100 mmol/L - - 25 - -       Exercise Target Goals:    Exercise Program Goal: Individual exercise prescription set with THRR, safety & activity barriers. Participant demonstrates ability to understand and report RPE using BORG scale, to self-measure pulse accurately, and to acknowledge the importance of the exercise prescription.  Exercise Prescription Goal: Starting with aerobic activity 30 plus minutes a day, 3 days per week for initial exercise prescription. Provide home exercise prescription and guidelines that participant acknowledges understanding prior to discharge.  Activity Barriers & Risk Stratification:     Activity Barriers & Cardiac Risk Stratification - 04/27/16 1514      Activity Barriers & Cardiac Risk Stratification   Activity Barriers Arthritis;Back Problems;Joint Problems;Muscular Weakness;Shortness of Breath;Deconditioning;Balance Concerns;History of Falls  r knee pops, uses knee brace; limited ROM overhead    Cardiac Risk Stratification High      6 Minute Walk:     6 Minute Walk    Row Name 04/27/16 1513 07/17/16 1816       6 Minute Walk   Phase Initial Discharge    Distance 1255 feet 1475 feet    Distance % Change  - 18 %    Walk Time 6 minutes 6 minutes    # of Rest Breaks 0 0    MPH 2.37 2.79    METS 2.76 3.14    RPE 13 13    Perceived Dyspnea  4 2    VO2 Peak 6.62 9.2    Symptoms Yes (comment) Yes (comment)  knee and  back pain 6 out of 10    Comments SOB, knee pain 3/10  -    Resting HR 58 bpm 63 bpm    Resting BP 126/74 94/50    Max Ex. HR 88 bpm 110 bpm    Max Ex. BP 128/74 146/54    2 Minute Post BP 122/66  -       Oxygen Initial Assessment:   Oxygen Re-Evaluation:   Oxygen Discharge (Final Oxygen Re-Evaluation):   Initial Exercise Prescription:     Initial Exercise Prescription - 04/27/16 1500      Date of Initial Exercise RX and Referring Provider   Date 04/27/16   Referring Provider Ida Rogue MD     NuStep   Level 1   SPM --  60-80 spm   Minutes 15   METs 2     REL-XR   Level 1   Watts --  speed 50   Minutes 15   METs 2     Track   Laps 20   Minutes 15   METs 1.92     Prescription Details   Frequency (times per week) 3   Duration Progress to 45 minutes of aerobic exercise without signs/symptoms of physical distress     Intensity   THRR 40-80% of Max Heartrate 92-126   Ratings of Perceived Exertion 11-15     Progression   Progression Continue to progress workloads to maintain intensity without signs/symptoms of physical distress.     Resistance Training   Training Prescription Yes   Weight 2 lbs   Reps 10-12      Perform Capillary Blood Glucose checks as needed.  Exercise Prescription Changes:     Exercise Prescription Changes    Row Name 04/27/16 1500 05/11/16 1100 05/26/16 1100 06/08/16 1100 06/08/16 1600     Response to Exercise   Blood Pressure (Admit) 126/74 106/62 118/54 132/64  -   Blood Pressure  (Exercise) 128/74 132/60 124/54 128/60  -   Blood Pressure (Exit) 122/66 124/78 98/48 108/58  -   Heart Rate (Admit) 558 bpm 76 bpm 59 bpm 61 bpm  -   Heart Rate (Exercise) 88 bpm 94 bpm 94 bpm 104 bpm  -   Heart Rate (Exit) 57 bpm 73 bpm 66 bpm 60 bpm  -   Oxygen Saturation (Admit) 100 %  -  -  -  -   Oxygen Saturation (Exercise) 100 %  -  -  -  -   Rating of Perceived Exertion (Exercise) _0 -   Perceived Dyspnea (Exercise) 4  -  -  -  -   Symptoms SOB, knee pain 3/10  -  -  -  -   Duration  - Progress to 45 minutes of aerobic exercise without signs/symptoms of physical distress Progress to 45 minutes of aerobic exercise without signs/symptoms of physical distress Progress to 45 minutes of aerobic exercise without signs/symptoms of physical distress Progress to 45 minutes of aerobic exercise without signs/symptoms of physical distress   Intensity  - THRR unchanged THRR unchanged THRR unchanged THRR unchanged     Progression   Progression  - Continue to progress workloads to maintain intensity without signs/symptoms of physical distress. Continue to progress workloads to maintain intensity without signs/symptoms of physical distress. Continue to progress workloads to maintain intensity without signs/symptoms of physical distress. Continue to progress workloads to maintain intensity without signs/symptoms of physical distress.     Resistance Training  Training Prescription  - Yes Yes Yes Yes   Weight  - _0 Reps  - 10-12 10-12 10-12 10-12     Interval Training   Interval Training  - No No No No     REL-XR   Level  -  - 1  -  -   Minutes  -  - 15  -  -   METs  -  - 4.5  -  -     Biostep-RELP   Level  - _1 Minutes  - _2 METs  -  -  - 2 2     Track   Laps  -  -  - 45 45   Minutes  - 15  - 15 15     Home Exercise Plan   Plans to continue exercise at  -  -  -  [employer] (comment)  will use wellness center in community    Frequency  -  -   -  - Add 2 additional days to program exercise sessions.     Exercise Review   Progression -  Walk Test Results  -  -  -  -   Row Name 06/23/16 0900 07/06/16 1200           Response to Exercise   Blood Pressure (Admit) 106/60 102/68      Blood Pressure (Exercise) 124/74 122/68      Blood Pressure (Exit) 92/60 102/54      Heart Rate (Admit) 60 bpm 59 bpm      Heart Rate (Exercise) 102 bpm 102 bpm      Heart Rate (Exit) 94 bpm 56 bpm      Rating of Perceived Exertion (Exercise) 13 13      Duration Progress to 45 minutes of aerobic exercise without signs/symptoms of physical distress Progress to 45 minutes of aerobic exercise without signs/symptoms of physical distress      Intensity THRR unchanged THRR unchanged        Progression   Progression Continue to progress workloads to maintain intensity without signs/symptoms of physical distress. Continue to progress workloads to maintain intensity without signs/symptoms of physical distress.        Resistance Training   Training Prescription Yes Yes      Weight 2 2      Reps 10-12 10-15        Interval Training   Interval Training No  -        NuStep   Level 2  -      Minutes 15  -      METs 3.2  -        REL-XR   Level 2 2      Minutes 15 15      METs 8.8 3.9        Biostep-RELP   Level  - 1      Minutes  - 15      METs  - 2         Exercise Comments:     Exercise Comments    Row Name 04/27/16 1530 05/08/16 1810 05/11/16 1129 05/26/16 1201 06/08/16 1158   Exercise Comments Rebekah wants to build her endurance to be able to make the bed (short) and walk without SOB. First full day of exercise!  Patient was oriented to gym and equipment including functions, settings, policies, and  procedures.  Patient's individual exercise prescription and treatment plan were reviewed.  All starting workloads were established based on the results of the 6 minute walk test done at initial orientation visit.  The plan for exercise  progression was also introduced and progression will be customized based on patient's performance and goals. Maurisa has done well in her first week of exercise. Ophie is tolerating exercise very well. Princessa is progressing well with exercise.   [place] Name 06/08/16 1650 06/22/16 1720 06/23/16 0933 07/06/16 1301     Exercise Comments Home exercise guidelines were reviewed with patinet. She plans to exercise at her wellness facility in her community 2 additional days outside of cardiac rehab class.  Gladynes back was bothering her so she did the Nustep instead of walking today. [place] will continue NS instead of walking  until her back feels better. Kinslie has not reported any more back pain.  Continue with exercise as tolerated.       Exercise Goals and Review:   Exercise Goals Re-Evaluation :   Discharge Exercise Prescription (Final Exercise Prescription Changes):     Exercise Prescription Changes - 07/06/16 1200      Response to Exercise   Blood Pressure (Admit) 102/68   Blood Pressure (Exercise) 122/68   Blood Pressure (Exit) 102/54   Heart Rate (Admit) 59 bpm   Heart Rate (Exercise) 102 bpm   Heart Rate (Exit) 56 bpm   Rating of Perceived Exertion (Exercise) 13   Duration Progress to 45 minutes of aerobic exercise without signs/symptoms of physical distress   Intensity THRR unchanged     Progression   Progression Continue to progress workloads to maintain intensity without signs/symptoms of physical distress.     Resistance Training   Training Prescription Yes   Weight 2   Reps 10-15     REL-XR   Level 2   Minutes 15   METs 3.9     Biostep-RELP   Level 1   Minutes 15   METs 2      Nutrition:  Target Goals: Understanding of nutrition guidelines, daily intake of sodium <1571m, cholesterol <2066m calories 30% from fat and 7% or less from saturated fats, daily to have 5 or more servings of fruits and vegetables.  Biometrics:     Pre Biometrics - 04/27/16  1532      Pre Biometrics   Height 5' 0.9" (1.547 m)   Weight 176 lb 9.6 oz (80.1 kg)   Waist Circumference 36 inches   Hip Circumference 46 inches   Waist to Hip Ratio 0.78 %   BMI (Calculated) 33.5   Single Leg Stand 1.73 seconds         Post Biometrics - 07/17/16 1820       Post  Biometrics   Height 5' 2" (1.575 m)   Weight 177 lb (80.3 kg)   Waist Circumference 36 inches   Hip Circumference 45.5 inches   Waist to Hip Ratio 0.79 %   BMI (Calculated) 32.4      Nutrition Therapy Plan and Nutrition Goals:     Nutrition Therapy & Goals - 05/15/16 1806      Nutrition Therapy   Drug/Food Interactions Statins/Certain Fruits     Personal Nutrition Goals   Comments GlAvalonill meet with the Cardiac REhab REgistered Dietician next week. [facility] [facility] that [facility] husband and daughter died. [facility]      Nutrition Discharge: Rate  Your Plate Scores:     Nutrition Assessments - 07/18/16 1127      Rate Your Plate Scores   Pre Score 64   Pre Score % 73 %   Post Score 66   Post Score % 78.5 %   % Change 5.5 %      Nutrition Goals Re-Evaluation:     Nutrition Goals Re-Evaluation    Row Name 05/24/16 1909 06/07/16 1619 07/19/16 1849         Goals   Comment Shiara said at [facility] they have hearts on the healthy meal items. Like today she got the chicken wrap with veggies.  To meet with Dietician next week. Gerri said she has cut back on her portions. and she eats vegetables. Evonda said she is going to take the 2 weeks meal list that [facility] gives her and ask them to mark the healthy food although she said she knows bascially which foods are healthy. Arizona Constable gets at least one meal a day plan at the [facility] where she lives. She cont to try to eat heallthy.      Expected Outcome  -  - Heart healthy eating.        Personal Goal #1 Re-Evaluation   Goal  Progress Seen Yes Yes  -        Nutrition Goals Discharge (Final Nutrition Goals Re-Evaluation):     Nutrition Goals Re-Evaluation - 07/19/16 1849      Goals   Comment Arizona Constable gets at least one meal a day plan at the [facility] where she lives. She cont to try to eat heallthy.    Expected Outcome Heart healthy eating.       Psychosocial: Target Goals: Acknowledge presence or absence of significant depression and/or stress, maximize coping skills, provide positive support system. Participant is able to verbalize types and ability to use techniques and skills needed for reducing stress and depression.   Initial Review & Psychosocial Screening:     Initial Psych Review & Screening - 04/27/16 1504      Initial Review   Current issues with --  pt voiced no concerns     Family Dynamics   Good Support System? Yes   Comments No psychosocial concerns were identified during medical review with Ms. Ramakrishnan     Barriers   Psychosocial barriers to participate in program There are no identifiable barriers or psychosocial needs.     Screening Interventions   Interventions Encouraged to exercise      Quality of Life Scores:      Quality of Life - 07/18/16 1127      Quality of Life Scores   Health/Function Pre 24.46 %   Health/Function Post 23.5 %   Health/Function % Change -3.92 %   Socioeconomic Pre 30 %   Socioeconomic Post 27.75 %   Socioeconomic % Change  -7.5 %   Psych/Spiritual Pre 28.29 %   Psych/Spiritual Post 27.21 %   Psych/Spiritual % Change -3.82 %   Family Pre 30 %   Family Post 28 %   Family % Change -6.67 %   GLOBAL Pre 26.89 %   GLOBAL Post 25.82 %   GLOBAL % Change -3.98 %      PHQ-9: Recent Review Flowsheet Data    Depression screen [facility] 2/9 07/18/2016 04/27/2016 02/14/2016 05/14/2015 09/07/2014   Decreased Interest 0 0 0 0 0   Down, Depressed, Hopeless 1 0 0 0 0   PHQ -  2 Score 1 0 0 0 0   Altered sleeping 1 0 - - -   Tired, decreased  energy 2 3 - - -   Change in appetite 0 0 - - -   Feeling bad or failure about yourself  0 0 - - -   Trouble concentrating 0 0 - - -   Moving slowly or fidgety/restless 0 0 - - -   Suicidal thoughts 0 0 - - -   PHQ-9 Score 4 3 - - -   Difficult doing work/chores Not difficult at all Somewhat difficult - - -     Interpretation of Total Score  Total Score Depression Severity:  1-4 = Minimal depression, 5-9 = Mild depression, 10-14 = Moderate depression, 15-19 = Moderately severe depression, 20-27 = Severe depression   Psychosocial Evaluation and Intervention:     Psychosocial Evaluation - 05/15/16 1744      Psychosocial Evaluation & Interventions   Interventions Encouraged to exercise with the program and follow exercise prescription   Comments Counselor met with Ms. Smarr today for initial psychosocial evaluation.  She is a 76 year old who had a heart attack and two stents inserted late November.  She has a strong support system with a son who lives close by; active involvement in her local church, and lives in a retirement community with lots of friends who check in on her.  Ms. Chauncey Cruel states she sleeps "okay" even with her CPAP and gets approximately 6-7 hours average of sleep per night.  She has a good appetite and denies a history or current symptoms of depression or anxiety.  Mr. Chauncey Cruel states she is typically in a positive mood; although she became tearful when speaking of her daughter's death this past 08/21/2022.  Counselor encouraged a grief share group to help her with the grief she is experiencing in a supportive environment.  Ms. Chauncey Cruel has goals to lose weight and develop some muscle strength while in this program.  She plans to return to her retirement community gym upon completion of this program.  Staff will follow with Ms. Chauncey Cruel over the course of this program.        Psychosocial Re-Evaluation:     Psychosocial Re-Evaluation    Row Name 05/11/16 1332 05/15/16 1752 05/24/16 1904 05/31/16 1714  06/07/16 1622     Psychosocial Re-Evaluation   Comments Makaiah called and said she is sorry that she can't attend Cardiac Rehab today since she is sick.  Haleemah was given information card for the Heart Woman's support group. Kensley said she missed her daughter who was always checking on her. Her daughter had a stroke and then heart problems. Parish said she was told about Grief Group at [facility] but she is trying to get moved in totally into [place].  Counselor follow up with Ms. Kasprzak today stating she has close friends and family members telling her they've noticed her not struggling as much with shortness of breath when she talks.  She has noticed this some, but mentioned no change when she walks - still difficulty breathing then especially.  She see a Dr. in a few weeks for follow up and will be discussing with him her ongoing sleep issues as well - thinking it may be a negative side effect to one of the medications.  Her sleep continues to be interrupted and she has difficulty going back to sleep.  Counselor assessed Ms. Laplante's coping currently with the loss of her daughter less than  a year ago, with her reporting her friends and family are checking in on her and taking her out to lunch or dinner quite often and this, along with just staing busy and trying "not to think" has been helpful to her during this time.  Counselor commended Ms. Streetman for her progress and how well she is coping currently and will continue to follow with her throughout the course of this program.   Evamae is listing to the stress education lecture today given by Lucianne Lei, MSW.    Interventions  - Encouraged to attend Cardiac Rehabilitation for the exercise  -  - Encouraged to attend Cardiac Rehabilitation for the exercise   Continue Psychosocial Services   - Yes  -  -  -   Row Name 06/19/16 1742 07/19/16 1848           Psychosocial Re-Evaluation   Comments Counselor follow up with Beverlyn Roux today stating her legs  are feeling stronger since coming into this program and she is walking a little better.  Counselor commended Ms. S for her hard work; progress made and commitment to consistent exercise.    -      Expected Outcomes  - Tareva completed 36/36 sessions of Cardiac Rehab today. She will exercise at the [employer] where she lives after this. Raileigh says she has neighbors and friends who exercise there so she feels suported there.          Psychosocial Discharge (Final Psychosocial Re-Evaluation):     Psychosocial Re-Evaluation - 07/19/16 1848      Psychosocial Re-Evaluation   Expected Outcomes Breyana completed 36/36 sessions of Cardiac Rehab today. She will exercise at the [employer] where she lives after this. Shakema says she has neighbors and friends who exercise there so she feels suported there.       Vocational Rehabilitation: Provide vocational rehab assistance to qualifying candidates.   Vocational Rehab Evaluation & Intervention:     Vocational Rehab - 04/27/16 1519      Initial Vocational Rehab Evaluation & Intervention   Assessment shows need for Vocational Rehabilitation No      Education: Education Goals: Education classes will be provided on a weekly basis, covering required topics. Participant will state understanding/return demonstration of topics presented.  Learning Barriers/Preferences:     Learning Barriers/Preferences - 04/27/16 1518      Learning Barriers/Preferences   Learning Barriers None   Learning Preferences None      Education Topics: General Nutrition Guidelines/Fats and Fiber: -Group instruction provided by verbal, written material, models and posters to present the general guidelines for heart healthy nutrition. Gives an explanation and review of dietary fats and fiber.   Cardiac Rehab from 07/19/2016 in [facility] Cardiac and Pulmonary Rehab  Date  07/03/16  Educator  PI  Instruction Review Code  2- meets goals/outcomes       Controlling Sodium/Reading Food Labels: -Group verbal and written material supporting the discussion of sodium use in heart healthy nutrition. Review and explanation with models, verbal and written materials for utilization of the food label.   Cardiac Rehab from 07/19/2016 in [facility] Cardiac and Pulmonary Rehab  Date  05/15/16  Educator  PI  Instruction Review Code  2- meets goals/outcomes      Exercise Physiology & Risk Factors: - Group verbal and written instruction with models to review the exercise physiology of the cardiovascular system and associated critical values. Details cardiovascular disease risk factors and the goals associated with each  risk factor.   Cardiac Rehab from 07/19/2016 in [facility] Cardiac and Pulmonary Rehab  Date  07/17/16  Educator  [facility]  Instruction Review Code  2- meets goals/outcomes      Aerobic Exercise & Resistance Training: - Gives group verbal and written discussion on the health impact of inactivity. On the components of aerobic and resistive training programs and the benefits of this training and how to safely progress through these programs.   Cardiac Rehab from 07/19/2016 in [facility] Cardiac and Pulmonary Rehab  Date  07/19/16  Educator  AS  Instruction Review Code  2- meets goals/outcomes      Flexibility, Balance, General Exercise Guidelines: - Provides group verbal and written instruction on the benefits of flexibility and balance training programs. Provides general exercise guidelines with specific guidelines to those with heart or lung disease. Demonstration and skill practice provided.   Stress Management: - Provides group verbal and written instruction about the health risks of elevated stress, cause of high stress, and healthy ways to reduce stress.   Cardiac Rehab from 07/19/2016 in [facility] Cardiac and Pulmonary Rehab  Date  06/07/16  Educator  Jeannetta Ellis, MSW  Instruction Review Code  2- meets goals/outcomes      Depression: -  Provides group verbal and written instruction on the correlation between heart/lung disease and depressed mood, treatment options, and the stigmas associated with seeking treatment.   Cardiac Rehab from 07/19/2016 in [facility] Cardiac and Pulmonary Rehab  Date  07/05/16  Educator  Jeannetta Ellis, MSW  Instruction Review Code  2- meets goals/outcomes      Anatomy & Physiology of the Heart: - Group verbal and written instruction and models provide basic cardiac anatomy and physiology, with the coronary electrical and arterial systems. Review of: AMI, Angina, Valve disease, Heart Failure, Cardiac Arrhythmia, Pacemakers, and the ICD.   Cardiac Rehab from 07/19/2016 in [facility] Cardiac and Pulmonary Rehab  Date  06/05/16  Educator  CE  Instruction Review Code  2- meets goals/outcomes      Cardiac Procedures: - Group verbal and written instruction and models to describe the testing methods done to diagnose heart disease. Reviews the outcomes of the test results. Describes the treatment choices: Medical Management, Angioplasty, or Coronary Bypass Surgery.   Cardiac Medications: - Group verbal and written instruction to review commonly prescribed medications for heart disease. Reviews the medication, class of the drug, and side effects. Includes the steps to properly store meds and maintain the prescription regimen.   Cardiac Rehab from 07/19/2016 in [facility] Cardiac and Pulmonary Rehab  Date  06/19/16  Educator  SB  Instruction Review Code  2- meets goals/outcomes      Go Sex-Intimacy & Heart Disease, Get SMART - Goal Setting: - Group verbal and written instruction through game format to discuss heart disease and the return to sexual intimacy. Provides group verbal and written material to discuss and apply goal setting through the application of the S.M.A.R.T. Method.   Other Matters of the Heart: - Provides group verbal, written materials and models to describe Heart Failure, Angina, Valve Disease, and  Diabetes in the realm of heart disease. Includes description of the disease process and treatment options available to the cardiac patient.   Cardiac Rehab from 07/19/2016 in [facility] Cardiac and Pulmonary Rehab  Date  06/05/16  Educator  CE  Instruction Review Code  2- meets goals/outcomes      Exercise & Equipment Safety: - Individual verbal instruction and demonstration of equipment use and safety  with use of the equipment.   Cardiac Rehab from 07/19/2016 in [facility] Cardiac and Pulmonary Rehab  Date  04/27/16  Educator  SB  Instruction Review Code  1- partially meets, needs review/practice      Infection Prevention: - Provides verbal and written material to individual with discussion of infection control including proper hand washing and proper equipment cleaning during exercise session.   Cardiac Rehab from 07/19/2016 in [facility] Cardiac and Pulmonary Rehab  Date  04/27/16  Educator  SB  Instruction Review Code  2- meets goals/outcomes      Falls Prevention: - Provides verbal and written material to individual with discussion of falls prevention and safety.   Cardiac Rehab from 07/19/2016 in [facility] Cardiac and Pulmonary Rehab  Date  04/27/16  Educator  SB  Instruction Review Code  2- meets goals/outcomes      Diabetes: - Individual verbal and written instruction to review signs/symptoms of diabetes, desired ranges of glucose level fasting, after meals and with exercise. Advice that pre and post exercise glucose checks will be done for 3 sessions at entry of program.    Knowledge Questionnaire Score:     Knowledge Questionnaire Score - 07/18/16 1127      Knowledge Questionnaire Score   Pre Score 21/28   Post Score 27/28      Core Components/Risk Factors/Patient Goals at Admission:     Personal Goals and Risk Factors at Admission - 05/15/16 1741      Core Components/Risk Factors/Patient Goals on Admission    Weight Management Yes      Core Components/Risk Factors/Patient  Goals Review:      Goals and Risk Factor Review    Row Name 05/15/16 1748 05/15/16 1750 05/15/16 1803 05/24/16 1906 05/24/16 1911     Core Components/Risk Factors/Patient Goals Review   Personal Goals Review Weight Management/Obesity  -  - Sedentary;Increase Strength and Stamina  -   Review Astria admits to gaining weight since being in Cardiac REhab but she said she had her heart attack when she was suppose to move to [facility] so she just had them bring her bed and tv. Orlinda Blalock said her daughter died a couple of years ago of heart problems and her husband died 3 years ago.  Glaydne's blood pressure after exercising for 10-15 minutes on the recumbent elliptical was 140/80.  Icess has an appt with the Cardiac Rehab Registered Dietician next week. Byron said she has gained weight since she has been here but has a lot of stressors going on.  Damon was able to walk around the gym today but had to stop after so many laps due to her hip/back hurt at times. Quinlan said she is still adjusting to [facility] retirment facility. Mckinlee said she is to see the Registered dietiican on Feb 7th and was suppse to see Dietican today but dietician is sick todya.  Kalisi said she has never smoked but around her 50 year old aunt who smokes and others. Her 32 year old aunt is a inspiration in the fact that she is still exercising twice a week and has exercised all her life.    Expected Outcomes  - Improve to have a heart healthy lifestyle.  Heart healthy lifestyle Heart healthy living.   -   Row Name 06/07/16 1621 07/13/16 1710 07/19/16 1848         Core Components/Risk Factors/Patient Goals Review   Personal Goals Review  - Weight Management/Obesity;Improve shortness of breath with ADL's;Hypertension  -  Review Today Ericia's weight is 277 and she has not lost weight. Arizona Constable has just met with the dietician. She is 27 years old she said.  Youlanda Mighty has lost 5 lb by decreasing serving sizes and eating more  vegetables.  Her BP has been low enough her Dr decreased metoprolol.  She still has some shortness of breath but feels her legs are getting stronger. Mariane completed 36/36 sessions of Cardiac Rehab today. She will exercise at the [employer] where she lives after this. Ashwika increased her 6 minute walk by 299fet or 18%!!     Expected Outcomes Heart healither living [place] continue her eating habits and see more weight loss. She will continue to exercise and SOB will get better.    -        Core Components/Risk Factors/Patient Goals at Discharge (Final Review):      Goals and Risk Factor Review - 07/19/16 1848      Core Components/Risk Factors/Patient Goals Review   Review Katalina completed 36/36 sessions of Cardiac Rehab today. [facility]. Berdie increased her 6 minute walk by 2059ft or 18%!!      ITP Comments:     ITP Comments    Row Name 04/27/16 1415 05/02/16 0655 05/11/16 1332 05/31/16 0652 06/28/16 0636   ITP Comments Medical review was completed today.  Initial ITP created during medical review.  Diagnosis documented in Discharge note from 03/25/2016 in Epic. 30 day review. Continue with ITP unless directed changes per Medical Director review. New to program Jaela called and said she is sorry that she can't attend Cardiac Rehab today since she is sick.  30 day review. Continue with ITP unless directed changes per Medical Director review. New to program 30 day review. Continue with ITP unless directed changes per Medical Director review   Row Name 07/19/16 1847 07/26/16 0646         ITP Comments Chamia completed 36/36 sessions of Cardiac Rehab today. [facility].  30 day review. Continue with ITP unless directed changes per Medical Director review Discharged         Comments:

## 2016-08-03 ENCOUNTER — Other Ambulatory Visit: Payer: Medicare Other

## 2016-08-07 ENCOUNTER — Other Ambulatory Visit (INDEPENDENT_AMBULATORY_CARE_PROVIDER_SITE_OTHER): Payer: Medicare Other

## 2016-08-07 DIAGNOSIS — R7989 Other specified abnormal findings of blood chemistry: Secondary | ICD-10-CM

## 2016-08-07 LAB — BASIC METABOLIC PANEL
BUN: 19 mg/dL (ref 6–23)
CHLORIDE: 102 meq/L (ref 96–112)
CO2: 34 meq/L — AB (ref 19–32)
Calcium: 9.4 mg/dL (ref 8.4–10.5)
Creatinine, Ser: 1.04 mg/dL (ref 0.40–1.20)
GFR: 54.5 mL/min — ABNORMAL LOW (ref >60.00)
Glucose, Bld: 114 mg/dL — ABNORMAL HIGH (ref 70–99)
POTASSIUM: 3.7 meq/L (ref 3.5–5.1)
SODIUM: 141 meq/L (ref 135–145)

## 2016-08-14 ENCOUNTER — Telehealth: Payer: Self-pay | Admitting: Cardiovascular Disease

## 2016-08-14 NOTE — Telephone Encounter (Signed)
Spoke w/ pt.  She states that Dr. Nicki Reaper recommended that she wait for year after starting blood thinner and stent placement before going to the dentist, but asked her to call and get Dr. Donivan Scull recommendation.  Pt would like to go to the dentist for routine cleaning only. Advised her that I will make Dr. Rockey Situ aware of her concerns and call her back w/ his recommendation.

## 2016-08-14 NOTE — Telephone Encounter (Signed)
Pt calling asking when will it be okay for her to go back to her dentist to get a cleaning.  She was told by her PCP that she needed to wait a year Please advise

## 2016-08-14 NOTE — Telephone Encounter (Signed)
Should be fine to have cleaning anytime, thx TG

## 2016-08-14 NOTE — Telephone Encounter (Signed)
Spoke w/ pt.  Advised her of Dr. Donivan Scull recommendation.  She is appreciative and will call back w/ any further questions or concerns.

## 2016-08-24 ENCOUNTER — Other Ambulatory Visit: Payer: Self-pay | Admitting: *Deleted

## 2016-08-24 ENCOUNTER — Other Ambulatory Visit: Payer: Self-pay | Admitting: Cardiovascular Disease

## 2016-08-24 MED ORDER — POTASSIUM CHLORIDE ER 10 MEQ PO TBCR: EXTENDED_RELEASE_TABLET | ORAL | 3 refills | Status: DC

## 2016-08-25 ENCOUNTER — Other Ambulatory Visit: Payer: Self-pay | Admitting: *Deleted

## 2016-08-25 MED ORDER — FLUTICASONE FUROATE-VILANTEROL 100-25 MCG/INH IN AEPB: 1.0000 | INHALATION_SPRAY | Freq: Every day | RESPIRATORY_TRACT | 1 refills | Status: DC

## 2016-08-26 ENCOUNTER — Other Ambulatory Visit: Payer: Self-pay | Admitting: Internal Medicine

## 2016-09-07 ENCOUNTER — Telehealth: Payer: Self-pay | Admitting: Internal Medicine

## 2016-09-07 NOTE — Telephone Encounter (Signed)
Left pt message asking to call Allison back directly at [phone] to schedule AWV. Thanks! °

## 2016-09-11 NOTE — Telephone Encounter (Signed)
Scheduled 09/22/16

## 2016-09-22 ENCOUNTER — Ambulatory Visit (INDEPENDENT_AMBULATORY_CARE_PROVIDER_SITE_OTHER): Payer: Medicare Other | Admitting: Internal Medicine

## 2016-09-22 ENCOUNTER — Ambulatory Visit (INDEPENDENT_AMBULATORY_CARE_PROVIDER_SITE_OTHER): Payer: Medicare Other

## 2016-09-22 ENCOUNTER — Ambulatory Visit: Payer: Medicare Other

## 2016-09-22 ENCOUNTER — Encounter: Payer: Self-pay | Admitting: Internal Medicine

## 2016-09-22 VITALS — BP 114/69 | HR 56 | Temp 98.1°F | Resp 14 | Ht 59.25 in | Wt 176.0 lb

## 2016-09-22 DIAGNOSIS — R0602 Shortness of breath: Secondary | ICD-10-CM

## 2016-09-22 DIAGNOSIS — G4733 Obstructive sleep apnea (adult) (pediatric): Secondary | ICD-10-CM | POA: Diagnosis not present

## 2016-09-22 DIAGNOSIS — I25110 Atherosclerotic heart disease of native coronary artery with unstable angina pectoris: Secondary | ICD-10-CM

## 2016-09-22 DIAGNOSIS — Z0000 Encounter for general adult medical examination without abnormal findings: Secondary | ICD-10-CM | POA: Diagnosis not present

## 2016-09-22 DIAGNOSIS — E669 Obesity, unspecified: Secondary | ICD-10-CM

## 2016-09-22 DIAGNOSIS — E1159 Type 2 diabetes mellitus with other circulatory complications: Secondary | ICD-10-CM | POA: Diagnosis not present

## 2016-09-22 DIAGNOSIS — Z23 Encounter for immunization: Secondary | ICD-10-CM | POA: Diagnosis not present

## 2016-09-22 DIAGNOSIS — I10 Essential (primary) hypertension: Secondary | ICD-10-CM | POA: Diagnosis not present

## 2016-09-22 DIAGNOSIS — E7800 Pure hypercholesterolemia, unspecified: Secondary | ICD-10-CM | POA: Diagnosis not present

## 2016-09-22 DIAGNOSIS — K219 Gastro-esophageal reflux disease without esophagitis: Secondary | ICD-10-CM

## 2016-09-22 DIAGNOSIS — W57XXXA Bitten or stung by nonvenomous insect and other nonvenomous arthropods, initial encounter: Secondary | ICD-10-CM

## 2016-09-22 DIAGNOSIS — F439 Reaction to severe stress, unspecified: Secondary | ICD-10-CM | POA: Diagnosis not present

## 2016-09-22 NOTE — Progress Notes (Signed)
Pre-visit discussion using our clinic review tool. No additional management support is needed unless otherwise documented below in the visit note.  

## 2016-09-22 NOTE — Progress Notes (Signed)
Patient ID: April [name], female   DOB: 1940-11-12, 76 y.o.   MRN: [id number]   Subjective:    Patient ID: April [name], female    DOB: 22-May-1940, 76 y.o.   MRN: [id number]  HPI  Patient here for a scheduled follow up.  She had diarrhea recently.  States ate an apple that she feels aggravated her bowels.  Lasted 24 hours.  Back to normal now.  Back on breo.  Breathing stable.  No chest pain.  No acid reflux.  No abdominal pain.  Found a tick on her right foot today.  No fever.  No headache.  No unusual joint aches.  No rash.  Overall she feels things are stable.    Past Medical History:  Diagnosis Date  . Arrhythmia    history of an irregular heart beat  . CAD S/P PCI pRCA Promus DES 3.5 x 16 (4.1 mm), ostRPDA Promus DES 2.5 x 12 (2.7 mm) 03/25/2016  . Diverticulitis   . Endometriosis   . Heart murmur    no significant valvular lesion noted on Echo  . Hiatal hernia   . History of colon polyps 08/1994  . Hypercholesterolemia   . Hypertension   . Migraines    history of migraines  . Osteoporosis   . Rheumatic fever   . Sleep apnea    CPAP  . ST elevation myocardial infarction (STEMI) of inferolateral wall, initial episode of care ([place]) 03/25/2016  . Urine incontinence    Past Surgical History:  Procedure Laterality Date  . ABDOMINAL HYSTERECTOMY     ovaries not removed  . APPENDECTOMY     was removed during hysterectomy  . Breast biopsies     x2  . BREAST BIOPSY Bilateral "years ago"  . BREAST SURGERY Bilateral   . CARDIAC CATHETERIZATION  2011   moderate 40% RCA disease  . CARDIAC CATHETERIZATION  01/2010   Dr. Gollan@[email or web address] : Only noted 40% RCA  . CARDIAC CATHETERIZATION N/A 03/25/2016   Procedure: Left Heart Cath and Coronary Angiography;  Surgeon: 04/07/2016, MD;  Location: [facility];  Service: Cardiovascular;  Laterality: N/A;  . CARDIAC CATHETERIZATION N/A 03/25/2016   Procedure: Coronary Stent Intervention;  Surgeon: 04/07/2016, MD;   Location: [place] [facility];  Service: Cardiovascular;  Laterality: N/A;  . COLONOSCOPY  2013  . ESOPHAGOGASTRODUODENOSCOPY (EGD) WITH PROPOFOL N/A 03/17/2015   Procedure: ESOPHAGOGASTRODUODENOSCOPY (EGD) WITH PROPOFOL;  Surgeon: 03/30/2015, MD;  Location: [facility] ENDOSCOPY;  Service: Gastroenterology;  Laterality: N/A;  . NM MYOVIEW ([place] HX)  07/2015   No evidence ischemia or infarction. EF 66%. Low risk  . TONSILLECTOMY    . TRANSTHORACIC ECHOCARDIOGRAM  10/2013   Normal LV size and function. EF 55-65%. GR 1 DD. Otherwise normal.   Family History  Problem Relation Age of Onset  . Arthritis Mother   . Stroke Mother   . Hypertension Mother   . Osteoporosis Mother   . Heart disease Father        MI  . Heart attack Father   . Stroke Brother   . Heart attack Sister   . Breast cancer Unknown        first cousin x 2  . Stroke Daughter   . Heart attack Daughter   . Colon cancer Neg Hx    Social History   Social History  . Marital status: Widowed    Spouse name: N/A  . Number of children: 2  . Years  of education: N/A   Social History Main Topics  . Smoking status: Never Smoker  . Smokeless tobacco: Never Used  . Alcohol use No  . Drug use: No  . Sexual activity: No   Other Topics Concern  . None   Social History Narrative  . None    Outpatient Encounter Prescriptions as of 09/22/2016  Medication Sig  . acetaminophen (TYLENOL) 500 MG tablet Take 500 mg by mouth daily as needed for headache (pain).  Marland Kitchen amiodarone (PACERONE) 200 MG tablet Take 1 tablet (200 mg total) by mouth daily.  Marland Kitchen amLODipine (NORVASC) 2.5 MG tablet Take 1 tablet (2.5 mg total) by mouth daily.  Marland Kitchen apixaban (ELIQUIS) 5 MG TABS tablet Take 1 tablet (5 mg total) by mouth 2 (two) times daily.  . calcium carbonate (TUMS - DOSED IN MG ELEMENTAL CALCIUM) 500 MG chewable tablet Chew 1-3 tablets by mouth daily as needed for indigestion or heartburn.   . cholecalciferol (VITAMIN D) 1000 units tablet  Take 1,000 Units by mouth 2 (two) times daily.  . clopidogrel (PLAVIX) 75 MG tablet Take 1 tablet (75 mg total) by mouth daily.  . fish oil-omega-3 fatty acids 1000 MG capsule Take 1 g by mouth 2 (two) times daily.   . fluticasone (FLONASE) 50 MCG/ACT nasal spray Place 1-2 sprays into both nostrils at bedtime.   . fluticasone furoate-vilanterol (BREO ELLIPTA) 100-25 MCG/INH AEPB Inhale 1 puff into the lungs daily.  . furosemide (LASIX) 20 MG tablet Take 1 tablet (20 mg total) by mouth daily.  Marland Kitchen losartan-hydrochlorothiazide (HYZAAR) 100-12.5 MG tablet TAKE ONE (1) TABLET EACH DAY  . meloxicam (MOBIC) 15 MG tablet Take 15 mg by mouth daily as needed for pain.   . Menthol, Topical Analgesic, (BIOFREEZE EX) Apply 1 application topically daily as needed (pain).  . metoprolol (LOPRESSOR) 50 MG tablet Take 1 tablet (50 mg total) by mouth 2 (two) times daily.  . Misc Natural Products (OSTEO BI-FLEX JOINT SHIELD PO) Take 1 tablet by mouth 2 (two) times daily.   . Multiple Vitamin (MULTIVITAMIN WITH MINERALS) TABS tablet Take 0.5 tablets by mouth 2 (two) times daily.  . nitroGLYCERIN (NITROSTAT) 0.4 MG SL tablet Place 1 tablet (0.4 mg total) under the tongue every 5 (five) minutes as needed. (Patient not taking: Reported on 09/22/2016)  . pantoprazole (PROTONIX) 40 MG tablet Take 1 tablet (40 mg total) by mouth daily.  Vladimir Faster Glycol-Propyl Glycol (SYSTANE OP) Place 1 drop into both eyes daily as needed (dry eyes).  . potassium chloride (K-DUR) 10 MEQ tablet TAKE ONE (1) TABLET EACH DAY AS NEEDED  . PRESCRIPTION MEDICATION Inhale into the lungs at bedtime. CPAP  . ranitidine (ZANTAC) 75 MG tablet Take 75 mg by mouth 2 (two) times daily.  . rosuvastatin (CRESTOR) 40 MG tablet Take 1 tablet (40 mg total) by mouth daily.  . traMADol (ULTRAM) 50 MG tablet Take 50 mg by mouth daily as needed (pain).   No facility-administered encounter medications on file as of 09/22/2016.     Review of Systems    Constitutional: Negative for appetite change and unexpected weight change.  HENT: Negative for congestion and sinus pressure.   Respiratory: Negative for cough and chest tightness.        Breathing stable.    Cardiovascular: Negative for chest pain and palpitations.  Gastrointestinal: Negative for abdominal pain, nausea and vomiting.       Diarrhea resolved.    Genitourinary: Negative for difficulty urinating and dysuria.  Musculoskeletal:  Negative for joint swelling and myalgias.  Skin: Negative for color change and rash.  Neurological: Negative for dizziness, light-headedness and headaches.  Psychiatric/Behavioral: Negative for agitation and dysphoric mood.       Objective:    Physical Exam  Constitutional: She appears well-developed and well-nourished. No distress.  HENT:  Nose: Nose normal.  Mouth/Throat: Oropharynx is clear and moist.  Neck: Neck supple. No thyromegaly present.  Cardiovascular: Normal rate and regular rhythm.   Pulmonary/Chest: Breath sounds normal. No respiratory distress. She has no wheezes.  Abdominal: Soft. Bowel sounds are normal. There is no tenderness.  Musculoskeletal: She exhibits no edema or tenderness.  Lymphadenopathy:    She has no cervical adenopathy.  Skin: No rash noted. No erythema.  Psychiatric: She has a normal mood and affect. Her behavior is normal.    BP 114/69 (BP Location: Right Arm, Patient Position: Sitting, Cuff Size: Normal)   Pulse (!) 56   Temp 98.1 F (36.7 C) (Oral)   Resp 14   Ht 4' 11.25" (1.505 m)   Wt 176 lb (79.8 kg)   LMP 05/01/1972   SpO2 99%   BMI 35.25 kg/m  Wt Readings from Last 3 Encounters:  09/22/16 176 lb (79.8 kg)  09/22/16 176 lb (79.8 kg)  07/17/16 177 lb (80.3 kg)     Lab Results  Component Value Date   WBC 9.8 03/26/2016   HGB 11.2 (L) 03/26/2016   HCT 33.7 (L) 03/26/2016   PLT 209 03/26/2016   GLUCOSE 114 (H) 08/07/2016   CHOL 146 06/26/2016   TRIG 87.0 06/26/2016   HDL 68.10  06/26/2016   LDLCALC 60 06/26/2016   ALT 18 06/26/2016   AST 24 06/26/2016   NA 141 08/07/2016   K 3.7 08/07/2016   CL 102 08/07/2016   CREATININE 1.04 08/07/2016   BUN 19 08/07/2016   CO2 34 (H) 08/07/2016   TSH 1.60 06/26/2016   INR 0.99 03/25/2016   HGBA1C 6.5 06/26/2016   MICROALBUR 4.2 (H) 06/17/2015    Mm Digital Screening  Result Date: 07/05/2016 CLINICAL DATA:  Screening. EXAM: DIGITAL SCREENING BILATERAL MAMMOGRAM WITH CAD COMPARISON:  Previous exam(s). ACR Breast Density Category c: The breast tissue is heterogeneously dense, which may obscure small masses. FINDINGS: There are no findings suspicious for malignancy. Images were processed with CAD. IMPRESSION: No mammographic evidence of malignancy. A result letter of this screening mammogram will be mailed directly to the patient. RECOMMENDATION: Screening mammogram in one year. (Code:SM-B-01Y) BI-RADS CATEGORY  1: Negative. Electronically Signed   By: Pamelia Hoit M.D.   On: 07/05/2016 08:08       Assessment & Plan:   Problem List Items Addressed This Visit    Coronary artery disease involving native coronary artery of native heart with unstable angina pectoris ([place])    Followed by cardiology.  Currently stable.  Continue risk factor modification.       Diabetes mellitus ([place])    Low carb diet and exercise.  Follow met b and a1c.        Relevant Orders   Hemoglobin W0J   Basic metabolic panel   Microalbumin / creatinine urine ratio   Essential hypertension, benign (Chronic)    Blood pressure under good control.  Continue same medication regimen.  Follow pressures.  Follow metabolic panel.        Relevant Orders   CBC with Differential/Platelet   GERD (gastroesophageal reflux disease)    Controlled on protonix.  Hypercholesterolemia (Chronic)    On crestor.  Low cholesterol diet and exercise.  Follow lipid panel.        Relevant Orders   Hepatic function panel   Lipid panel   Obesity (BMI 30-39.9)  (Chronic)    Diet and exercise.  Follow.        Obstructive sleep apnea (Chronic)    CPAP.       SOB (shortness of breath)    Followed by pulmonary and cardiology.  Back on breo.  Breathing stable.  Follow.        Stress    Increased stress as outlined in previous notes.  Overall she feels she is doing well.  Follow.  Desires no further intervention.        Other Visit Diagnoses    Tick bite, initial encounter    -  Primary   Tick on her right foot.  Removed.  tolerated well.         Einar Pheasant, MD

## 2016-09-22 NOTE — Progress Notes (Signed)
Subjective:   April [name] is a 76 y.o. female who presents for an Initial Medicare Annual Wellness Visit.  Review of Systems    No ROS.  Medicare Wellness Visit.  Cardiac Risk Factors include: advanced age (>70men, >68 women);hypertension;diabetes mellitus;obesity (BMI >30kg/m2)     Objective:    Today's Vitals   09/22/16 1348  BP: 114/69  Pulse: (!) 56  Resp: 14  Temp: 98.1 F (36.7 C)  TempSrc: Oral  SpO2: 99%  Weight: 176 lb (79.8 kg)  Height: 4' 11.25" (1.505 m)   Body mass index is 35.25 kg/m.   Current Medications (verified) Outpatient Encounter Prescriptions as of 09/22/2016  Medication Sig  . amiodarone (PACERONE) 200 MG tablet Take 1 tablet (200 mg total) by mouth daily.  Marland Kitchen amLODipine (NORVASC) 2.5 MG tablet Take 1 tablet (2.5 mg total) by mouth daily.  Marland Kitchen apixaban (ELIQUIS) 5 MG TABS tablet Take 1 tablet (5 mg total) by mouth 2 (two) times daily.  . cholecalciferol (VITAMIN D) 1000 units tablet Take 1,000 Units by mouth 2 (two) times daily.  . clopidogrel (PLAVIX) 75 MG tablet Take 1 tablet (75 mg total) by mouth daily.  . fish oil-omega-3 fatty acids 1000 MG capsule Take 1 g by mouth 2 (two) times daily.   . fluticasone furoate-vilanterol (BREO ELLIPTA) 100-25 MCG/INH AEPB Inhale 1 puff into the lungs daily.  . furosemide (LASIX) 20 MG tablet Take 1 tablet (20 mg total) by mouth daily.  Marland Kitchen losartan-hydrochlorothiazide (HYZAAR) 100-12.5 MG tablet TAKE ONE (1) TABLET EACH DAY  . metoprolol (LOPRESSOR) 50 MG tablet Take 1 tablet (50 mg total) by mouth 2 (two) times daily.  . Misc Natural Products (OSTEO BI-FLEX JOINT SHIELD PO) Take 1 tablet by mouth 2 (two) times daily.   . Multiple Vitamin (MULTIVITAMIN WITH MINERALS) TABS tablet Take 0.5 tablets by mouth 2 (two) times daily.  . pantoprazole (PROTONIX) 40 MG tablet Take 1 tablet (40 mg total) by mouth daily.  Vladimir Faster Glycol-Propyl Glycol (SYSTANE OP) Place 1 drop into both eyes daily as needed (dry eyes).   . potassium chloride (K-DUR) 10 MEQ tablet TAKE ONE (1) TABLET EACH DAY AS NEEDED  . PRESCRIPTION MEDICATION Inhale into the lungs at bedtime. CPAP  . ranitidine (ZANTAC) 75 MG tablet Take 75 mg by mouth 2 (two) times daily.  . rosuvastatin (CRESTOR) 40 MG tablet Take 1 tablet (40 mg total) by mouth daily.  Marland Kitchen acetaminophen (TYLENOL) 500 MG tablet Take 500 mg by mouth daily as needed for headache (pain).  . calcium carbonate (TUMS - DOSED IN MG ELEMENTAL CALCIUM) 500 MG chewable tablet Chew 1-3 tablets by mouth daily as needed for indigestion or heartburn.   . fluticasone (FLONASE) 50 MCG/ACT nasal spray Place 1-2 sprays into both nostrils at bedtime.   . meloxicam (MOBIC) 15 MG tablet Take 15 mg by mouth daily as needed for pain.   . Menthol, Topical Analgesic, (BIOFREEZE EX) Apply 1 application topically daily as needed (pain).  . nitroGLYCERIN (NITROSTAT) 0.4 MG SL tablet Place 1 tablet (0.4 mg total) under the tongue every 5 (five) minutes as needed. (Patient not taking: Reported on 09/22/2016)  . traMADol (ULTRAM) 50 MG tablet Take 50 mg by mouth daily as needed (pain).  . [DISCONTINUED] albuterol (PROVENTIL HFA;VENTOLIN HFA) 108 (90 Base) MCG/ACT inhaler Inhale 2 puffs into the lungs every 4 (four) hours as needed for wheezing or shortness of breath.   No facility-administered encounter medications on file as of 09/22/2016.  Allergies (verified) Penicillins and Sulfa antibiotics   History: Past Medical History:  Diagnosis Date  . Arrhythmia    history of an irregular heart beat  . CAD S/P PCI pRCA Promus DES 3.5 x 16 (4.1 mm), ostRPDA Promus DES 2.5 x 12 (2.7 mm) 03/25/2016  . Diverticulitis   . Endometriosis   . Heart murmur    no significant valvular lesion noted on Echo  . Hiatal hernia   . History of colon polyps 08/1994  . Hypercholesterolemia   . Hypertension   . Migraines    history of migraines  . Osteoporosis   . Rheumatic fever   . Sleep apnea    CPAP  . 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 Urine incontinence    Past Surgical History:  Procedure Laterality Date  . ABDOMINAL HYSTERECTOMY     ovaries not removed  . APPENDECTOMY     was removed during hysterectomy  . Breast biopsies     x2  . BREAST BIOPSY Bilateral "years ago"  . BREAST SURGERY Bilateral   . CARDIAC CATHETERIZATION  2011   moderate 40% RCA disease  . CARDIAC CATHETERIZATION  01/2010   Dr. Gollan@[facility] : Only noted 40% RCA  . CARDIAC CATHETERIZATION N/A 03/25/2016   Procedure: Left Heart Cath and Coronary Angiography;  Surgeon: 04/07/2016, MD;  Location: [facility];  Service: Cardiovascular;  Laterality: N/A;  . CARDIAC CATHETERIZATION N/A 03/25/2016   Procedure: Coronary Stent Intervention;  Surgeon: 04/07/2016, MD;  Location: [facility];  Service: Cardiovascular;  Laterality: N/A;  . COLONOSCOPY  2013  . ESOPHAGOGASTRODUODENOSCOPY (EGD) WITH PROPOFOL N/A 03/17/2015   Procedure: ESOPHAGOGASTRODUODENOSCOPY (EGD) WITH PROPOFOL;  Surgeon: 03/30/2015, MD;  Location: [facility];  Service: Gastroenterology;  Laterality: N/A;  . NM MYOVIEW ([place] [facility])  07/2015   No evidence ischemia or infarction. EF 66%. Low risk  . TONSILLECTOMY    . TRANSTHORACIC ECHOCARDIOGRAM  10/2013   Normal LV size and function. EF 55-65%. GR 1 DD. Otherwise normal.   Family History  Problem Relation Age of Onset  . Arthritis Mother   . Stroke Mother   . Hypertension Mother   . Osteoporosis Mother   . Heart disease Father        MI  . Heart attack Father   . Stroke Brother   . Heart attack Sister   . Breast cancer Unknown        first cousin x 2  . Stroke Daughter   . Heart attack Daughter   . Colon cancer Neg Hx    Social History   Occupational History  . Not on file.   Social History Main Topics  . Smoking status: Never Smoker  . Smokeless tobacco: Never Used  . Alcohol  use No  . Drug use: No  . Sexual activity: No    Tobacco Counseling Counseling given: Not Answered   Activities of Daily Living In your present state of health, do you have any difficulty performing the following activities: 09/22/2016 03/28/2016  Hearing? N N  Vision? N N  Difficulty concentrating or making decisions? N N  Walking or climbing stairs? Y N  Dressing or bathing? N N  Doing errands, shopping? N N  Preparing Food and eating ? N -  Using the Toilet? N -  In the past six months, have you accidently leaked urine? N -  Do you have  problems with loss of bowel control? N -  Managing your Medications? N -  Managing your Finances? N -  Housekeeping or managing your Housekeeping? N -  Some recent data might be hidden    Immunizations and Health Maintenance Immunization History  Administered Date(s) Administered  . DTaP 07/08/2010  . Influenza, High Dose Seasonal PF 03/16/2016  . Influenza,inj,Quad PF,36+ Mos 04/14/2014, 01/01/2015  . Pneumococcal Polysaccharide-23 09/23/2003  . Zoster Recombinat (Shingrix) 08/29/2004   Health Maintenance Due  Topic Date Due  . FOOT EXAM  10/23/1950  . OPHTHALMOLOGY EXAM  10/23/1950  . PNA vac Low Risk Adult (1 of 2 - PCV13) 10/24/2003    Patient Care Team: Einar Pheasant, MD as PCP - General (Internal Medicine) Christene Lye, MD (General Surgery) Kirk Ruths, MD (Internal Medicine) Einar Pheasant, MD as Referring Physician (Internal Medicine) Minna Merritts, MD as Consulting Physician (Cardiology)  Indicate any recent Medical Services you may have received from other than [facility] providers in the past year (date may be approximate).     Assessment:   This is a routine wellness examination for Valyn.  The goal of the wellness visit is to assist the patient how to close the gaps in care and create a preventative care plan for the patient.   Taking calcium VIT D as appropriate/Osteoporosis  reviewed.  Medications reviewed; taking without issues or barriers.  Safety issues reviewed; smoke detectors in the home. No firearms in the home. Wears seatbelts when driving or riding with others. Patient does wear sunscreen or protective clothing when in direct sunlight. No violence in the home.  Depression- PHQ 2 &9 complete.  No signs/symptoms or verbal communication regarding little pleasure in doing things, feeling down, depressed or hopeless. No changes in sleeping, energy, eating, concentrating.  No thoughts of self harm or harm towards others.  Time spent on this topic is 8 minutes.   Patient is alert, normal appearance, oriented to person/place/and time. Correctly identified the president of the [place], recall of 3/3 words, and performing simple calculations.  Patient displays appropriate judgement and can read correct time from watch face.  No new identified risk were noted.  No failures at ADL's or IADL's.   BMI- discussed the importance of a healthy diet, water intake and exercise. Educational material provided.   24 hour diet recall: Breakfast: Cereal bar Lunch: Chicken salad, meatballs, crackers Dinner: Half rack of ribs, fries, baked beans Daily fluid intake: 1 cups of caffeine,  2 cups of water  HTN- followed by PCP.  Dental- every six months.  Dr. Juleen China.  Sleep patterns- Sleeps 6  hours at night.  Wakes feeling rested. CPAP in use.  Prevnar 13 vaccine deferred per patient preference. Educational material provided.  Patient Concerns: Tick on R foot x1 day.  Deferred to PCP for follow up.  Hearing/Vision screen Hearing Screening Comments: Patient is able to hear conversational tones without difficulty.  No issues reported.   Vision Screening Comments: Followed by [facility] Wears corrective lenses when reading Last OV 2017 Cataract extraction, bilateral Visual acuity not assessed per patient preference since they have regular follow up with the  ophthalmologist  Dietary issues and exercise activities discussed: Current Exercise Habits: Home exercise routine, Type of exercise: stretching (Chair exercises), Time (Minutes): 10, Intensity: Mild  Goals    . Increase physical activity          Stay active and exercise, increase as tolerated.    . Increase water intake  Stay hydrated and drink plenty of fluids      Depression Screen PHQ 2/9 Scores 09/22/2016 07/18/2016 04/27/2016 02/14/2016 05/14/2015 09/07/2014 02/11/2013  PHQ - 2 Score 0 1 0 0 0 0 0  PHQ- 9 Score 0 4 3 - - - -    Fall Risk Fall Risk  09/22/2016 04/27/2016 02/14/2016 05/14/2015 09/07/2014  Falls in the past year? No No No Yes No  Number falls in past yr: - - - 1 -  Injury with Fall? - - - No -    Cognitive Function: MMSE - Mini Mental State Exam 09/22/2016  Orientation to time 5  Orientation to Place 5  Registration 3  Attention/ Calculation 5  Recall 3  Language- name 2 objects 2  Language- repeat 1  Language- follow 3 step command 3  Language- read & follow direction 1  Write a sentence 1  Copy design 1  Total score 30        Screening Tests Health Maintenance  Topic Date Due  . FOOT EXAM  10/23/1950  . OPHTHALMOLOGY EXAM  10/23/1950  . PNA vac Low Risk Adult (1 of 2 - PCV13) 10/24/2003  . INFLUENZA VACCINE  11/29/2016  . HEMOGLOBIN A1C  12/24/2016  . MAMMOGRAM  07/04/2017  . TETANUS/TDAP  06/29/2020  . DEXA SCAN  Completed      Plan:    End of life planning; Advance aging; Advanced directives discussed. Copy of current HCPOA/Living Will requested.    I have personally reviewed and noted the following in the patient's chart:   . Medical and social history . Use of alcohol, tobacco or illicit drugs  . Current medications and supplements . Functional ability and status . Nutritional status . Physical activity . Advanced directives . List of other physicians . Hospitalizations, surgeries, and ER visits in previous 12  months . Vitals . Screenings to include cognitive, depression, and falls . Referrals and appointments  In addition, I have reviewed and discussed with patient certain preventive protocols, quality metrics, and best practice recommendations. A written personalized care plan for preventive services as well as general preventive health recommendations were provided to patient.     Varney Biles, LPN   3/33/8329    Reviewed above information.  Pt was seen after wellness visit.  Tick removed.  Pt voiced no problems with removal.  Was notified of symptoms to monitor.    Dr Nicki Reaper

## 2016-09-22 NOTE — Patient Instructions (Addendum)
  Ms. Goetzke , Thank you for taking time to come for your Medicare Wellness Visit. I appreciate your ongoing commitment to your health goals. Please review the following plan we discussed and let me know if I can assist you in the future.   Follow up with Dr. Nicki Reaper as needed.    Bring a copy of your [place] and/or Living Will to be scanned into chart.  Have a great day!  These are the goals we discussed: Goals    . Increase physical activity          Stay active and exercise, increase as tolerated.    . Increase water intake          Stay hydrated and drink plenty of fluids       This is a list of the screening recommended for you and due dates:  Health Maintenance  Topic Date Due  . Complete foot exam   10/23/1950  . Eye exam for diabetics  10/23/1950  . Pneumonia vaccines (1 of 2 - PCV13) 10/24/2003  . Flu Shot  11/29/2016  . Hemoglobin A1C  12/24/2016  . Mammogram  07/04/2017  . Tetanus Vaccine  06/29/2020  . DEXA scan (bone density measurement)  Completed

## 2016-09-24 ENCOUNTER — Encounter: Payer: Self-pay | Admitting: Internal Medicine

## 2016-09-24 NOTE — Assessment & Plan Note (Signed)
CPAP.  

## 2016-09-24 NOTE — Assessment & Plan Note (Signed)
Low carb diet and exercise.  Follow met b and a1c.   

## 2016-09-24 NOTE — Assessment & Plan Note (Signed)
Controlled on protonix.   

## 2016-09-24 NOTE — Assessment & Plan Note (Signed)
Diet and exercise.  Follow.  

## 2016-09-24 NOTE — Assessment & Plan Note (Signed)
Increased stress as outlined in previous notes.  Overall she feels she is doing well.  Follow.  Desires no further intervention.

## 2016-09-24 NOTE — Assessment & Plan Note (Signed)
Blood pressure under good control.  Continue same medication regimen.  Follow pressures.  Follow metabolic panel.   

## 2016-09-24 NOTE — Assessment & Plan Note (Signed)
Followed by pulmonary and cardiology.  Back on breo.  Breathing stable.  Follow.

## 2016-09-24 NOTE — Assessment & Plan Note (Signed)
Followed by cardiology.  Currently stable.  Continue risk factor modification.

## 2016-09-24 NOTE — Assessment & Plan Note (Signed)
On crestor.  Low cholesterol diet and exercise.  Follow lipid panel.  

## 2016-09-29 NOTE — Addendum Note (Signed)
Addended by: Francella Solian on: 09/29/2016 04:18 PM   Modules accepted: Orders

## 2016-10-02 ENCOUNTER — Encounter: Payer: Self-pay | Admitting: Internal Medicine

## 2016-10-02 ENCOUNTER — Ambulatory Visit (INDEPENDENT_AMBULATORY_CARE_PROVIDER_SITE_OTHER): Payer: Medicare Other | Admitting: Internal Medicine

## 2016-10-02 VITALS — BP 136/78 | HR 63 | Resp 16 | Ht 59.5 in | Wt 176.0 lb

## 2016-10-02 DIAGNOSIS — I2119 ST elevation (STEMI) myocardial infarction involving other coronary artery of inferior wall: Secondary | ICD-10-CM

## 2016-10-02 DIAGNOSIS — G4730 Sleep apnea, unspecified: Secondary | ICD-10-CM

## 2016-10-02 MED ORDER — FLUTICASONE FUROATE-VILANTEROL 100-25 MCG/INH IN AEPB: 1.0000 | INHALATION_SPRAY | Freq: Every day | RESPIRATORY_TRACT | 0 refills | Status: DC

## 2016-10-02 NOTE — Progress Notes (Signed)
MRN# [id number] April [name] 1941/03/29   CC: Follow up OSA and SOB  Brief History: HPI 06/2014  ROV 07/2014 Plan - cont with cpap, severe hiatal hernia, referral to surgery for large hiatal hernia, cont with exercise as tolerated. Has normal pfts (no obs process).   ROV 10/2014: Plan: Normal PFTS, 6mwt with only 372 feet. Most likely deconditioning.  ROV 04/2015 Plan  - continue with CPAP - exercise as tolerated.  - ENT F\U - Cardiology Referral - Breo 2 week trial - patient to call office for rx if needed.   Events last clinic visit: Patient resents today for follow-up of her chronic dyspnea on exertion, and chronic nonproductive cough. Follow up OSA CPAP 11 cm h20 with AHI 1.8 100% compliance . Patient is on Breo, and states that she feels that this is definitely helping her shortness of breath with exertion and cough. She is also seeing cardiology,Dr. Rockey Situ, +coronary artery disease  Overall doing well with CPAP therapy and breathing No signs of infection at this time   Medication:    Review of Systems: Gen:  Denies  fever, sweats, chills HEENT: Denies blurred vision, double vision, ear pain, eye pain, hearing loss, nose bleeds, sore throat Cvc:  No dizziness, mild intermittent sharp left chest pain at rest.  Resp:   Admits to:sob, doe, dry cough,  (baseline) Gi: Denies swallowing difficulty, stomach pain, nausea or vomiting, diarrhea, constipation, bowel incontinence Gu:  Denies bladder incontinence, burning urine Ext:   Leg swelling Skin: No skin rash, easy bruising or bleeding or hives Endoc:  No polyuria, polydipsia , polyphagia or weight change Other:  All other systems negative  Allergies:  Penicillins and Sulfa antibiotics  Physical Examination:  VS: BP 136/78 (BP Location: Left Arm, Cuff Size: Normal)   Pulse 63   Resp 16   Ht 4' 11.5" (1.511 m)   Wt 176 lb (79.8 kg)   LMP 05/01/1972   SpO2 99%   BMI 34.95 kg/m   General Appearance: No  distress  HEENT: PERRLA, no ptosis, no other lesions noticed Pulmonary:normal breath sounds., diaphragmatic excursion normal.No wheezing, No rales   Cardiovascular:  Normal S1,S2.  No m/r/g.     Abdomen:Exam: Benign, Soft, non-tender, No masses  Skin:   warm, no rashes, no ecchymosis  Extremities: normal, no cyanosis, clubbing, warm with normal capillary refill.  1+ bilateral lower extremity edema pitting      Assessment and Plan:   76 year old female seen in follow-up for dyspnea on exertion/SOB with CAD and deconditioned state with OSA  DOE (dyspnea on exertion)/SOB-likely related to underlying reactive airways disease and deconditioned state I believe that her dyspnea on exertion is multifactorial in nature due to factors such as: Deconditioning, obstructive sleep apnea, obesity, hiatal hernia, coronary artery disease, cardiac megaly, nasal congestion/allergies She now with improvement since being on Breo and adjusting her PPI  Deconditioned state-recommend exercise as tolerated  CAD-needs to follow up with cardiology   OSA with CPAP therapy AHI now 1.8    Patient satisfied with Plan of action and management. All questions answered Follow up 6 months   [place]atham [place] Patricia Pesa, M.D.  [facility]  Medical Director [place] Director [facility] Cardio-Pulmonary Department

## 2016-10-02 NOTE — Patient Instructions (Signed)
Continue BREo Continue CPAP 11cm h20

## 2016-10-02 NOTE — Addendum Note (Signed)
Addended by: Devona Konig on: 10/02/2016 01:43 PM   Modules accepted: Orders

## 2016-10-24 ENCOUNTER — Other Ambulatory Visit (INDEPENDENT_AMBULATORY_CARE_PROVIDER_SITE_OTHER): Payer: Medicare Other

## 2016-10-24 DIAGNOSIS — I10 Essential (primary) hypertension: Secondary | ICD-10-CM

## 2016-10-24 DIAGNOSIS — E7800 Pure hypercholesterolemia, unspecified: Secondary | ICD-10-CM

## 2016-10-24 DIAGNOSIS — E1159 Type 2 diabetes mellitus with other circulatory complications: Secondary | ICD-10-CM | POA: Diagnosis not present

## 2016-10-24 LAB — LIPID PANEL
CHOLESTEROL: 151 mg/dL (ref 0–200)
HDL: 62.6 mg/dL (ref >39.00)
LDL Cholesterol: 72 mg/dL (ref 0–99)
NonHDL: 88.2
TRIGLYCERIDES: 83 mg/dL (ref 0.0–149.0)
Total CHOL/HDL Ratio: 2
VLDL: 16.6 mg/dL (ref 0.0–40.0)

## 2016-10-24 LAB — CBC WITH DIFFERENTIAL/PLATELET
BASOS ABS: 0 10*3/uL (ref 0.0–0.1)
BASOS PCT: 0.4 % (ref 0.0–3.0)
EOS PCT: 2.6 % (ref 0.0–5.0)
Eosinophils Absolute: 0.2 10*3/uL (ref 0.0–0.7)
HEMATOCRIT: 37 % (ref 36.0–46.0)
Hemoglobin: 12.4 g/dL (ref 12.0–15.0)
LYMPHS ABS: 2.2 10*3/uL (ref 0.7–4.0)
Lymphocytes Relative: 31.5 % (ref 12.0–46.0)
MCHC: 33.5 g/dL (ref 30.0–36.0)
MCV: 91.9 fl (ref 78.0–100.0)
MONOS PCT: 10.3 % (ref 3.0–12.0)
Monocytes Absolute: 0.7 10*3/uL (ref 0.1–1.0)
NEUTROS ABS: 3.8 10*3/uL (ref 1.4–7.7)
NEUTROS PCT: 55.2 % (ref 43.0–77.0)
PLATELETS: 215 10*3/uL (ref 150.0–400.0)
RBC: 4.02 Mil/uL (ref 3.87–5.11)
RDW: 13.4 % (ref 11.5–15.5)
WBC: 6.8 10*3/uL (ref 4.0–10.5)

## 2016-10-24 LAB — MICROALBUMIN / CREATININE URINE RATIO
Creatinine,U: 38.9 mg/dL
Microalb Creat Ratio: 1.8 mg/g (ref 0.0–30.0)

## 2016-10-24 LAB — BASIC METABOLIC PANEL
BUN: 18 mg/dL (ref 6–23)
CHLORIDE: 102 meq/L (ref 96–112)
CO2: 35 meq/L — AB (ref 19–32)
CREATININE: 1.06 mg/dL (ref 0.40–1.20)
Calcium: 9.4 mg/dL (ref 8.4–10.5)
GFR: 53.29 mL/min — ABNORMAL LOW (ref >60.00)
Glucose, Bld: 108 mg/dL — ABNORMAL HIGH (ref 70–99)
POTASSIUM: 3.7 meq/L (ref 3.5–5.1)
Sodium: 141 mEq/L (ref 135–145)

## 2016-10-24 LAB — HEPATIC FUNCTION PANEL
ALBUMIN: 3.9 g/dL (ref 3.5–5.2)
ALT: 20 U/L (ref 0–35)
AST: 24 U/L (ref 0–37)
Alkaline Phosphatase: 48 U/L (ref 39–117)
Bilirubin, Direct: 0.1 mg/dL (ref 0.0–0.3)
Total Bilirubin: 0.5 mg/dL (ref 0.2–1.2)
Total Protein: 6.4 g/dL (ref 6.0–8.3)

## 2016-10-24 LAB — HEMOGLOBIN A1C: Hgb A1c MFr Bld: 6.6 % — ABNORMAL HIGH (ref 4.6–6.5)

## 2016-10-24 NOTE — Addendum Note (Signed)
Addended by: Arby Barrette on: 10/24/2016 09:35 AM   Modules accepted: Orders

## 2016-10-24 NOTE — Addendum Note (Signed)
Addended by: Arby Barrette on: 10/24/2016 12:32 PM   Modules accepted: Orders

## 2016-10-27 ENCOUNTER — Other Ambulatory Visit: Payer: Self-pay | Admitting: Cardiology

## 2016-11-11 ENCOUNTER — Encounter: Payer: Self-pay | Admitting: Emergency Medicine

## 2016-11-11 ENCOUNTER — Emergency Department
Admission: EM | Admit: 2016-11-11 | Discharge: 2016-11-11 | Disposition: A | Discharge status: Home / Self Care | Payer: Medicare Other | Attending: Emergency Medicine | Admitting: Emergency Medicine

## 2016-11-11 ENCOUNTER — Emergency Department: Payer: Medicare Other

## 2016-11-11 DIAGNOSIS — R0789 Other chest pain: Secondary | ICD-10-CM | POA: Diagnosis not present

## 2016-11-11 DIAGNOSIS — Z79899 Other long term (current) drug therapy: Secondary | ICD-10-CM | POA: Insufficient documentation

## 2016-11-11 DIAGNOSIS — Z7902 Long term (current) use of antithrombotics/antiplatelets: Secondary | ICD-10-CM | POA: Diagnosis not present

## 2016-11-11 DIAGNOSIS — Z7901 Long term (current) use of anticoagulants: Secondary | ICD-10-CM | POA: Diagnosis not present

## 2016-11-11 DIAGNOSIS — E119 Type 2 diabetes mellitus without complications: Secondary | ICD-10-CM | POA: Insufficient documentation

## 2016-11-11 DIAGNOSIS — I25110 Atherosclerotic heart disease of native coronary artery with unstable angina pectoris: Secondary | ICD-10-CM | POA: Insufficient documentation

## 2016-11-11 DIAGNOSIS — I10 Essential (primary) hypertension: Secondary | ICD-10-CM | POA: Insufficient documentation

## 2016-11-11 DIAGNOSIS — R079 Chest pain, unspecified: Secondary | ICD-10-CM | POA: Diagnosis present

## 2016-11-11 LAB — TROPONIN I: Troponin I: <0.03 ng/mL (ref <0.03)

## 2016-11-11 LAB — CBC
HCT: 38.9 % (ref 35.0–47.0)
Hemoglobin: 13.2 g/dL (ref 12.0–16.0)
MCH: 30.7 pg (ref 26.0–34.0)
MCHC: 33.8 g/dL (ref 32.0–36.0)
MCV: 90.6 fL (ref 80.0–100.0)
Platelets: 227 10*3/uL (ref 150–440)
RBC: 4.29 MIL/uL (ref 3.80–5.20)
RDW: 13.7 % (ref 11.5–14.5)
WBC: 7.5 10*3/uL (ref 3.6–11.0)

## 2016-11-11 LAB — BASIC METABOLIC PANEL
Anion gap: 10 (ref 5–15)
BUN: 16 mg/dL (ref 6–20)
CO2: 30 mmol/L (ref 22–32)
Calcium: 9.4 mg/dL (ref 8.9–10.3)
Chloride: 99 mmol/L — ABNORMAL LOW (ref 101–111)
Creatinine, Ser: 1.22 mg/dL — ABNORMAL HIGH (ref 0.44–1.00)
GFR calc Af Amer: 48 mL/min — ABNORMAL LOW (ref >60)
GFR, EST NON AFRICAN AMERICAN: 41 mL/min — AB (ref >60)
GLUCOSE: 107 mg/dL — AB (ref 65–99)
POTASSIUM: 3.4 mmol/L — AB (ref 3.5–5.1)
Sodium: 139 mmol/L (ref 135–145)

## 2016-11-11 NOTE — ED Provider Notes (Signed)
[facility] Emergency Department Provider Note   ____________________________________________   I have reviewed the triage vital signs and the nursing notes.   HISTORY  Chief Complaint Chest Pain   History limited by: Not Limited   HPI April [name] is a 76 y.o. female who presents to the emergency department today because of concerns for chest pain. The pain started yesterday. It has been somewhat constant since then. It is worse with movement. She describes it as being located under her left breast. It will squeeze. Patient has not noticed any worsening shortness breath over chronic shortness of breath. The patient states that she had a heart attack last year however today's pain does not reminder that. She has not had any diaphoresis or flushing.    Past Medical History:  Diagnosis Date  . Arrhythmia    history of an irregular heart beat  . CAD S/P PCI pRCA Promus DES 3.5 x 16 (4.1 mm), ostRPDA Promus DES 2.5 x 12 (2.7 mm) 03/25/2016  . Diverticulitis   . Endometriosis   . Heart murmur    no significant valvular lesion noted on Echo  . Hiatal hernia   . History of colon polyps 08/1994  . Hypercholesterolemia   . Hypertension   . Migraines    history of migraines  . Osteoporosis   . Rheumatic fever   . Sleep apnea    CPAP  . ST elevation myocardial infarction (STEMI) of inferolateral wall, initial episode of care ([place]) 03/25/2016  . Urine incontinence     Patient Active Problem List   Diagnosis Date Noted  . New onset atrial fibrillation ([place]) 03/27/2016  . ST elevation myocardial infarction (STEMI) of inferolateral wall, initial episode of care ([place]) 03/25/2016  . CAD S/P PCI pRCA Promus DES 3.5 x 16 (4.1 mm), ostRPDA Promus DES 2.5 x 12 (2.7 mm) 03/25/2016  . Right knee pain 11/13/2015  . Coronary artery disease involving native coronary artery of native heart with unstable angina pectoris ([place]) 06/01/2015  . Diabetes mellitus ([place])  05/16/2015  . Gastritis 04/01/2015  . Chest pain 04/01/2015  . Hiatal hernia 08/25/2014  . DOE (dyspnea on exertion) 07/15/2014  . Congestion of throat 07/06/2014  . Health care maintenance 07/06/2014  . Fibrocystic breast disease 07/06/2014  . Obesity (BMI 30-39.9) 04/19/2014  . Stress 02/16/2013  . Rib pain on right side 02/16/2013  . SOB (shortness of breath) 10/13/2012  . GERD (gastroesophageal reflux disease) 10/13/2012  . Essential hypertension, benign 08/18/2012  . Hypercholesterolemia 08/18/2012  . History of colonic polyps 08/18/2012  . Obstructive sleep apnea 08/18/2012  . Osteoporosis 08/18/2012    Past Surgical History:  Procedure Laterality Date  . ABDOMINAL HYSTERECTOMY     ovaries not removed  . APPENDECTOMY     was removed during hysterectomy  . Breast biopsies     x2  . BREAST BIOPSY Bilateral "years ago"  . BREAST SURGERY Bilateral   . CARDIAC CATHETERIZATION  2011   moderate 40% RCA disease  . CARDIAC CATHETERIZATION  01/2010   Dr. Gollan@[facility] : Only noted 40% RCA  . CARDIAC CATHETERIZATION N/A 03/25/2016   Procedure: Left Heart Cath and Coronary Angiography;  Surgeon: 04/07/2016, MD;  Location: [facility];  Service: Cardiovascular;  Laterality: N/A;  . CARDIAC CATHETERIZATION N/A 03/25/2016   Procedure: Coronary Stent Intervention;  Surgeon: 04/07/2016, MD;  Location: [facility];  Service: Cardiovascular;  Laterality: N/A;  . COLONOSCOPY  2013  .  ESOPHAGOGASTRODUODENOSCOPY (EGD) WITH PROPOFOL N/A 03/17/2015   Procedure: ESOPHAGOGASTRODUODENOSCOPY (EGD) WITH PROPOFOL;  Surgeon: Christene Lye, MD;  Location: [facility];  Service: Gastroenterology;  Laterality: N/A;  . NM MYOVIEW ([place] HX)  07/2015   No evidence ischemia or infarction. EF 66%. Low risk  . TONSILLECTOMY    . TRANSTHORACIC ECHOCARDIOGRAM  10/2013   Normal LV size and function. EF 55-65%. GR 1 DD. Otherwise normal.    Prior to Admission medications    Medication Sig Start Date End Date Taking? Authorizing Provider  acetaminophen (TYLENOL) 500 MG tablet Take 500 mg by mouth daily as needed for headache (pain).   Yes [provider]  amiodarone (PACERONE) 200 MG tablet Take 1 tablet (200 mg total) by mouth daily. 03/30/16  Yes Reino Bellis B, NP  amLODipine (NORVASC) 2.5 MG tablet Take 1 tablet (2.5 mg total) by mouth daily. 03/30/16  Yes Reino Bellis B, NP  cholecalciferol (VITAMIN D) 1000 units tablet Take 1,000 Units by mouth 2 (two) times daily.   Yes [provider]  clopidogrel (PLAVIX) 75 MG tablet Take 1 tablet (75 mg total) by mouth daily. 03/30/16  Yes Cheryln Manly, NP  ELIQUIS 5 MG TABS tablet TAKE ONE TABLET TWICE DAILY 10/27/16  Yes Wellington Hampshire, MD  fish oil-omega-3 fatty acids 1000 MG capsule Take 1 g by mouth 2 (two) times daily.    Yes [provider]  fluticasone furoate-vilanterol (BREO ELLIPTA) 100-25 MCG/INH AEPB Inhale 1 puff into the lungs daily. 10/02/16  Yes Flora Lipps, MD  furosemide (LASIX) 20 MG tablet Take 1 tablet (20 mg total) by mouth daily. 03/30/16  Yes Cheryln Manly, NP  losartan-hydrochlorothiazide (HYZAAR) 100-12.5 MG tablet TAKE ONE (1) TABLET EACH DAY 08/28/16  Yes Einar Pheasant, MD  meloxicam (MOBIC) 15 MG tablet Take 15 mg by mouth daily as needed for pain.    Yes [provider]  Menthol, Topical Analgesic, (BIOFREEZE EX) Apply 1 application topically daily as needed (pain).   Yes [provider]  metoprolol (LOPRESSOR) 50 MG tablet Take 1 tablet (50 mg total) by mouth 2 (two) times daily. 07/03/16  Yes Gollan, Kathlene November, MD  Misc Natural Products (OSTEO BI-FLEX JOINT SHIELD PO) Take 1 tablet by mouth 2 (two) times daily.    Yes [provider]  Multiple Vitamin (MULTIVITAMIN WITH MINERALS) TABS tablet Take 0.5 tablets by mouth 2 (two) times daily.   Yes [provider]  nitroGLYCERIN (NITROSTAT) 0.4 MG SL tablet Place 1  tablet (0.4 mg total) under the tongue every 5 (five) minutes as needed. 03/29/16  Yes Reino Bellis B, NP  pantoprazole (PROTONIX) 40 MG tablet Take 1 tablet (40 mg total) by mouth daily. 03/30/16  Yes Cheryln Manly, NP  Polyethyl Glycol-Propyl Glycol (SYSTANE OP) Place 1 drop into both eyes daily as needed (dry eyes).   Yes [provider]  potassium chloride (K-DUR) 10 MEQ tablet TAKE ONE (1) TABLET EACH DAY AS NEEDED 08/24/16  Yes Gollan, Kathlene November, MD  ranitidine (ZANTAC) 75 MG tablet Take 75 mg by mouth 2 (two) times daily.   Yes [provider]  rosuvastatin (CRESTOR) 40 MG tablet Take 1 tablet (40 mg total) by mouth daily. 03/30/16  Yes Cheryln Manly, NP  PRESCRIPTION MEDICATION Inhale into the lungs at bedtime. CPAP    [provider]    Allergies Penicillins and Sulfa antibiotics  Family History  Problem Relation Age of Onset  . Arthritis Mother   .  Stroke Mother   . Hypertension Mother   . Osteoporosis Mother   . Heart disease Father        MI  . Heart attack Father   . Stroke Brother   . Heart attack Sister   . Breast cancer Unknown        first cousin x 2  . Stroke Daughter   . Heart attack Daughter   . Colon cancer Neg Hx     Social History Social History  Substance Use Topics  . Smoking status: Never Smoker  . Smokeless tobacco: Never Used  . Alcohol use No    Review of Systems Constitutional: No fever/chills Eyes: No visual changes. ENT: No sore throat. Cardiovascular: Positive for chest pain Respiratory: Denies shortness of breath. Gastrointestinal: No abdominal pain.  No nausea, no vomiting.  No diarrhea.   Genitourinary: Negative for dysuria. Musculoskeletal: Negative for back pain. Skin: Negative for rash. Neurological: Negative for headaches, focal weakness or numbness.  ____________________________________________   PHYSICAL EXAM:  VITAL SIGNS: ED Triage Vitals  Enc Vitals Group     BP 11/11/16 1642  (!) 173/69     Pulse Rate 11/11/16 1642 (!) 59     Resp 11/11/16 1642 18     Temp 11/11/16 1642 98.9 F (37.2 C)     Temp Source 11/11/16 1642 Oral     SpO2 11/11/16 1642 98 %     Weight 11/11/16 1638 176 lb (79.8 kg)     Height 11/11/16 1638 4\' 11"  (1.499 m)     Head Circumference --      Peak Flow --      Pain Score 11/11/16 1638 3   Constitutional: Alert and oriented. Well appearing and in no distress. Eyes: Conjunctivae are normal.  ENT   Head: Normocephalic and atraumatic.   Nose: No congestion/rhinnorhea.   Mouth/Throat: Mucous membranes are moist.   Neck: No stridor. Hematological/Lymphatic/Immunilogical: No cervical lymphadenopathy. Cardiovascular: Normal rate, regular rhythm.  No murmurs, rubs, or gallops. Respiratory: Normal respiratory effort without tachypnea nor retractions. Breath sounds are clear and equal bilaterally. No wheezes/rales/rhonchi. Gastrointestinal: Soft and non tender. No rebound. No guarding.  Genitourinary: Deferred Musculoskeletal: Normal range of motion in all extremities. No lower extremity edema. Neurologic:  Normal speech and language. No gross focal neurologic deficits are appreciated.  Skin:  Skin is warm, dry and intact. No rash noted. Psychiatric: Mood and affect are normal. Speech and behavior are normal. Patient exhibits appropriate insight and judgment.  ____________________________________________    LABS (pertinent positives/negatives)  I, Nance Pear, attending physician, personally viewed and interpreted this EKG  EKG Time: 1637 Rate: 59 Rhythm: sinus bradycardia with 1st degree av block Axis: normal Intervals: qtc 479 QRS: narrow, q waves V1, V2 ST changes: no st elevation Impression: abnormal ekg   ____________________________________________   EKG  I, Nance Pear, attending physician, personally viewed and interpreted this EKG  EKG Time: 1656 Rate: 93 Rhythm: normal sinus rhythm Axis:  normal Intervals: qtc 447 QRS: narrow ST changes: no st elevation Impression: normal ekg   ____________________________________________    RADIOLOGY  CXR IMPRESSION: 1. No acute abnormality. Subsegmental atelectasis or scarring in the left lung. 2. Borderline cardiomegaly. Atherosclerotic thoracic aorta.  ___________   PROCEDURES  Procedures  ____________________________________________   INITIAL IMPRESSION / ASSESSMENT AND PLAN / ED COURSE  Pertinent labs & imaging results that were available during my care of the patient were reviewed by me and considered in my medical decision making (see chart for  details).  Patient presented to the emergency department today because of concerns for chest pain. Chest pain is been present since yesterday. The patient had 2 sets of negative troponin. It does not remind the patient ever previous heart attack. This point I think it is reasonable for patient be discharged home. While patient follow-up with primary care.  ____________________________________________   FINAL CLINICAL IMPRESSION(S) / ED DIAGNOSES  Final diagnoses:  Other chest pain     Note: This dictation was prepared with Dragon dictation. Any transcriptional errors that result from this process are unintentional     Nance Pear, MD 11/11/16 2314

## 2016-11-11 NOTE — ED Triage Notes (Signed)
Pt c/o intermittent chest pain under left breast that seems to be worse with exertion and relieved by rest.  Denies SHOB from baseline.  Denies any nausea, vomiting, sweating.  Pt reports pain feels different than when MI in past.  Ambulatory to triage. NAD. VSS.

## 2016-11-11 NOTE — Discharge Instructions (Signed)
Please seek medical attention for any high fevers, chest pain, shortness of breath, change in behavior, persistent vomiting, bloody stool or any other new or concerning symptoms.  

## 2016-11-20 ENCOUNTER — Telehealth: Payer: Self-pay | Admitting: Internal Medicine

## 2016-11-20 NOTE — Telephone Encounter (Signed)
Spoke with the patient and reviewed your notes, she is watching her great grandson today so she is unable to go until after 5pm and then she is not happy about the ED recommendation as she sat there for hours last time.  I explained that without an assessment it is hard to determine what is causing her pressure and swelling and that she needs someone to check everything before we can order treatments, she verbalized understanding.  States she will maybe go tomorrow to the urgent care and see what they can do.  I suggested [facility] and [facility] urgent [facility] as they have more resources then the restanding ones.  thanks

## 2016-11-20 NOTE — Telephone Encounter (Signed)
Patient was seen in the ED for left sided chest pressure, discharged after cardiac markers negative, told to follow up with PCP in 2 days.  Called today.  Returned the call to the patients, per the patient was seen for pressure in her chest and it was found to be Scarring to the left lung.  She had been lifting things so she thinks it was related to that.  She has had no chest pain, but the pressure is still there.  The swelling in her foot has been there for some time, but has gotten progressively worse.  She states that they are staying swollen longer now.  Past few days have been worse.  Swelling is in bilateral ankles and feet up to the knee region.  I asked her to check for pitting status, approximately 2-3 +.  She takes lasix daily and that seems to work for a short time.  She urinates 3, 4 times and then it tapers off.  She was told to wear ted hose, but has too much difficulty putting them on to comply.  She is elevating her extremities and has been laying in bed for the past couple of days and they seem a little better today.  She has not checked her BP today, but last week it was high at the ED, but checked at home the next day it was in the 371'G systolically.  States she has been a little more Short of breath in the past few days, but contributes that to the heat, SOB with ambulating and talking, (side note, not noticeable with talking today) she admitted that she is not in taking a lot, needs to drink more fluids, but is just not thirsty.

## 2016-11-20 NOTE — Telephone Encounter (Signed)
Reviewed ER notes and her message.  Given that she is having persistent chest pressure, lower extremity swelling and sob - she needs to go ahead and be evaluated today.  May need CT chest (to confirm no PE, etc).  Would recommend evaluation today and then we can f/u after.

## 2016-11-20 NOTE — Telephone Encounter (Signed)
Pt called and stated that she was in the Ed last Saturday 7/14. Pt was in for chest pressure, they told her that she has scarring on her left lung and wanted to to follow up Dr. Kavin Leech.. Pt states that she is having feet swelling the left worse then the right. Please advise, thank you!  Call pt @ [phone]

## 2016-11-21 NOTE — Telephone Encounter (Signed)
Glad she is feeling better.  Please schedule her to see me on 12/05/16 (open spot - I think 2:00).  Also, send a note to ashley to hold name for work in appt (cancellation).  Agree with evaluation if any worsening symptoms.  Also, I would like for her to make cardiology aware of her symptoms.

## 2016-11-21 NOTE — Telephone Encounter (Signed)
Spoke with the patient, she did not go to the ED yesterday, she woke up this morning feeling much better.  Her feet are much better, states "Almost back to normal".  Her SOB is back to what she considers her baseline from years.  I did explain that she really should be seen to make sure she has not other acute issues going on and she declines unless she feels worse again.  I advised that the urgent cares are open till 7pm or there is the ED even with the wait would be indicated.  She agreed if she feels bad to go. thanks

## 2016-11-21 NOTE — Telephone Encounter (Signed)
Pt called back wanting you to know she did not go back to the ED yesterday. Pt states she is doing a lot better.   Call pt @ [phone]. Thank you!

## 2016-11-22 ENCOUNTER — Other Ambulatory Visit: Payer: Self-pay | Admitting: Cardiology

## 2016-11-22 ENCOUNTER — Other Ambulatory Visit: Payer: Self-pay | Admitting: Internal Medicine

## 2016-11-22 NOTE — Telephone Encounter (Signed)
This is Dr. Gollan's pt. °

## 2016-11-22 NOTE — Telephone Encounter (Signed)
Spoke with patient ,stated she is feeling better today also, swelling has dramatically reduced in bilateral lower extremities.  Patient scheduled for open appt on the 7th of August as requested.    Note to Caryl Pina, please hold for possible cancellation work in appt. Thanks

## 2016-12-05 ENCOUNTER — Ambulatory Visit (INDEPENDENT_AMBULATORY_CARE_PROVIDER_SITE_OTHER): Payer: Medicare Other | Admitting: Internal Medicine

## 2016-12-05 ENCOUNTER — Encounter: Payer: Self-pay | Admitting: Internal Medicine

## 2016-12-05 VITALS — BP 126/70 | HR 50 | Temp 98.6°F | Resp 12 | Ht 59.0 in | Wt 180.4 lb

## 2016-12-05 DIAGNOSIS — I25110 Atherosclerotic heart disease of native coronary artery with unstable angina pectoris: Secondary | ICD-10-CM | POA: Diagnosis not present

## 2016-12-05 DIAGNOSIS — E7800 Pure hypercholesterolemia, unspecified: Secondary | ICD-10-CM

## 2016-12-05 DIAGNOSIS — R0602 Shortness of breath: Secondary | ICD-10-CM | POA: Diagnosis not present

## 2016-12-05 DIAGNOSIS — I10 Essential (primary) hypertension: Secondary | ICD-10-CM | POA: Diagnosis not present

## 2016-12-05 DIAGNOSIS — K449 Diaphragmatic hernia without obstruction or gangrene: Secondary | ICD-10-CM

## 2016-12-05 DIAGNOSIS — R7989 Other specified abnormal findings of blood chemistry: Secondary | ICD-10-CM | POA: Diagnosis not present

## 2016-12-05 DIAGNOSIS — R079 Chest pain, unspecified: Secondary | ICD-10-CM

## 2016-12-05 DIAGNOSIS — Z6836 Body mass index (BMI) 36.0-36.9, adult: Secondary | ICD-10-CM | POA: Diagnosis not present

## 2016-12-05 DIAGNOSIS — G4733 Obstructive sleep apnea (adult) (pediatric): Secondary | ICD-10-CM | POA: Diagnosis not present

## 2016-12-05 DIAGNOSIS — K219 Gastro-esophageal reflux disease without esophagitis: Secondary | ICD-10-CM | POA: Diagnosis not present

## 2016-12-05 DIAGNOSIS — E1159 Type 2 diabetes mellitus with other circulatory complications: Secondary | ICD-10-CM

## 2016-12-05 DIAGNOSIS — F439 Reaction to severe stress, unspecified: Secondary | ICD-10-CM | POA: Diagnosis not present

## 2016-12-05 DIAGNOSIS — I2119 ST elevation (STEMI) myocardial infarction involving other coronary artery of inferior wall: Secondary | ICD-10-CM

## 2016-12-05 NOTE — Progress Notes (Signed)
Patient ID: April [name], female   DOB: 1941/04/28, 76 y.o.   MRN: [id number]   Subjective:    Patient ID: April [name], female    DOB: 07-28-1940, 76 y.o.   MRN: [id number]  HPI  Patient here for a scheduled follow up. She was seen in the ER 11/11/16 with chest pain.  This work up unrevealing.  States this was left side/left lateral chest - pain.  She has moved recently.  Trying to clean out her house.  When working, she has noticed that her chest feels heavy.  Also describes as tired.  States 2-3 weeks ago, had an episode of chest discomfort.  Was more intense.  Took a NTG.  Symptoms improved.  Repeated in 4 hours.  Has not had to take NTG since.  Breathing overall stable.  No acid reflux.  No abdominal pain.  Bowels moving.  Has had some leg swelling.  Is better today.  States her blood pressure has been averaging 120-130/70-80.     Past Medical History:  Diagnosis Date  . Arrhythmia    history of an irregular heart beat  . CAD S/P PCI pRCA Promus DES 3.5 x 16 (4.1 mm), ostRPDA Promus DES 2.5 x 12 (2.7 mm) 03/25/2016  . Diverticulitis   . Endometriosis   . Heart murmur    no significant valvular lesion noted on Echo  . Hiatal hernia   . History of colon polyps 08/1994  . Hypercholesterolemia   . Hypertension   . Migraines    history of migraines  . Osteoporosis   . Rheumatic fever   . Sleep apnea    CPAP  . ST elevation myocardial infarction (STEMI) of inferolateral wall, initial episode of care ([place]) 03/25/2016  . Urine incontinence    Past Surgical History:  Procedure Laterality Date  . ABDOMINAL HYSTERECTOMY     ovaries not removed  . APPENDECTOMY     was removed during hysterectomy  . Breast biopsies     x2  . BREAST BIOPSY Bilateral "years ago"  . BREAST SURGERY Bilateral   . CARDIAC CATHETERIZATION  2011   moderate 40% RCA disease  . CARDIAC CATHETERIZATION  01/2010   Dr. Suzie Portela : Only noted 40% RCA  . CARDIAC CATHETERIZATION N/A 03/25/2016   Procedure:  Left Heart Cath and Coronary Angiography;  Surgeon: Leonie Man, MD;  Location: [facility];  Service: Cardiovascular;  Laterality: N/A;  . CARDIAC CATHETERIZATION N/A 03/25/2016   Procedure: Coronary Stent Intervention;  Surgeon: Leonie Man, MD;  Location: [place] [facility];  Service: Cardiovascular;  Laterality: N/A;  . COLONOSCOPY  2013  . ESOPHAGOGASTRODUODENOSCOPY (EGD) WITH PROPOFOL N/A 03/17/2015   Procedure: ESOPHAGOGASTRODUODENOSCOPY (EGD) WITH PROPOFOL;  Surgeon: Christene Lye, MD;  Location: [facility];  Service: Gastroenterology;  Laterality: N/A;  . NM MYOVIEW ([place] HX)  07/2015   No evidence ischemia or infarction. EF 66%. Low risk  . TONSILLECTOMY    . TRANSTHORACIC ECHOCARDIOGRAM  10/2013   Normal LV size and function. EF 55-65%. GR 1 DD. Otherwise normal.   Family History  Problem Relation Age of Onset  . Arthritis Mother   . Stroke Mother   . Hypertension Mother   . Osteoporosis Mother   . Heart disease Father        MI  . Heart attack Father   . Stroke Brother   . Heart attack Sister   . Breast cancer Unknown        first  cousin x 2  . Stroke Daughter   . Heart attack Daughter   . Colon cancer Neg Hx    Social History   Social History  . Marital status: Widowed    Spouse name: N/A  . Number of children: 2  . Years of education: N/A   Social History Main Topics  . Smoking status: Never Smoker  . Smokeless tobacco: Never Used  . Alcohol use No  . Drug use: No  . Sexual activity: No   Other Topics Concern  . None   Social History Narrative  . None    Outpatient Encounter Prescriptions as of 12/05/2016  Medication Sig  . acetaminophen (TYLENOL) 500 MG tablet Take 500 mg by mouth daily as needed for headache (pain).  Marland Kitchen amiodarone (PACERONE) 200 MG tablet TAKE ONE (1) TABLET EACH DAY  . amLODipine (NORVASC) 2.5 MG tablet TAKE ONE (1) TABLET EACH DAY  . BREO ELLIPTA 100-25 MCG/INH AEPB Inhale 1 puff into the lungs daily.    . cholecalciferol (VITAMIN D) 1000 units tablet Take 1,000 Units by mouth 2 (two) times daily.  . clopidogrel (PLAVIX) 75 MG tablet TAKE ONE (1) TABLET EACH DAY  . ELIQUIS 5 MG TABS tablet TAKE ONE TABLET TWICE DAILY  . fish oil-omega-3 fatty acids 1000 MG capsule Take 1 g by mouth 2 (two) times daily.   . fluticasone furoate-vilanterol (BREO ELLIPTA) 100-25 MCG/INH AEPB Inhale 1 puff into the lungs daily.  . furosemide (LASIX) 20 MG tablet Take 1 tablet (20 mg total) by mouth daily.  Marland Kitchen losartan-hydrochlorothiazide (HYZAAR) 100-12.5 MG tablet TAKE ONE (1) TABLET EACH DAY  . meloxicam (MOBIC) 15 MG tablet Take 15 mg by mouth daily as needed for pain.   . Menthol, Topical Analgesic, (BIOFREEZE EX) Apply 1 application topically daily as needed (pain).  . metoprolol (LOPRESSOR) 50 MG tablet Take 1 tablet (50 mg total) by mouth 2 (two) times daily.  . Misc Natural Products (OSTEO BI-FLEX JOINT SHIELD PO) Take 1 tablet by mouth 2 (two) times daily.   . Multiple Vitamin (MULTIVITAMIN WITH MINERALS) TABS tablet Take 0.5 tablets by mouth 2 (two) times daily.  . nitroGLYCERIN (NITROSTAT) 0.4 MG SL tablet Place 1 tablet (0.4 mg total) under the tongue every 5 (five) minutes as needed.  . pantoprazole (PROTONIX) 40 MG tablet TAKE ONE (1) TABLET EACH DAY  . Polyethyl Glycol-Propyl Glycol (SYSTANE OP) Place 1 drop into both eyes daily as needed (dry eyes).  . potassium chloride (K-DUR) 10 MEQ tablet TAKE ONE (1) TABLET EACH DAY AS NEEDED  . PRESCRIPTION MEDICATION Inhale into the lungs at bedtime. CPAP  . ranitidine (ZANTAC) 75 MG tablet Take 75 mg by mouth 2 (two) times daily.  . rosuvastatin (CRESTOR) 40 MG tablet TAKE ONE (1) TABLET EACH DAY   No facility-administered encounter medications on file as of 12/05/2016.     Review of Systems  Constitutional: Negative for appetite change and unexpected weight change.  HENT: Negative for congestion and sinus pressure.   Respiratory: Negative for cough.         Breathing overall stable.   Cardiovascular: Positive for leg swelling. Negative for palpitations.       Chest discomfort as outlined.  Chest heaviness.    Gastrointestinal: Negative for abdominal pain, diarrhea, nausea and vomiting.  Genitourinary: Negative for difficulty urinating and dysuria.  Musculoskeletal: Negative for joint swelling and myalgias.  Skin: Negative for color change and rash.  Neurological: Negative for dizziness, light-headedness and headaches.  Psychiatric/Behavioral: Negative for agitation and dysphoric mood.       Objective:     Blood pressure rechecked by me:  126/70  Physical Exam  Constitutional: She appears well-developed and well-nourished. No distress.  HENT:  Nose: Nose normal.  Mouth/Throat: Oropharynx is clear and moist.  Neck: Neck supple. No thyromegaly present.  Cardiovascular: Normal rate and regular rhythm.   Pulmonary/Chest: Breath sounds normal. No respiratory distress. She has no wheezes.  Abdominal: Soft. Bowel sounds are normal. There is no tenderness.  Musculoskeletal: She exhibits no tenderness.  Some pedal and ankle edema.  No increased erythema.   Lymphadenopathy:    She has no cervical adenopathy.  Skin: No rash noted. No erythema.  Psychiatric: She has a normal mood and affect. Her behavior is normal.    BP 126/70   Pulse (!) 50   Temp 98.6 F (37 C) (Oral)   Resp 12   Ht _0  (1.499 m)   Wt 180 lb 6.4 oz (81.8 kg)   LMP 05/01/1972   BMI 36.44 kg/m  Wt Readings from Last 3 Encounters:  12/05/16 180 lb 6.4 oz (81.8 kg)  11/11/16 176 lb (79.8 kg)  10/02/16 176 lb (79.8 kg)     Lab Results  Component Value Date   WBC 7.5 11/11/2016   HGB 13.2 11/11/2016   HCT 38.9 11/11/2016   PLT 227 11/11/2016   GLUCOSE 103 (H) 12/05/2016   CHOL 151 10/24/2016   TRIG 83.0 10/24/2016   HDL 62.60 10/24/2016   LDLCALC 72 10/24/2016   ALT 20 10/24/2016   AST 24 10/24/2016   NA 141 12/05/2016   K 4.0 12/05/2016   CL 100  12/05/2016   CREATININE 1.42 (H) 12/05/2016   BUN 21 12/05/2016   CO2 36 (H) 12/05/2016   TSH 1.60 06/26/2016   INR 0.99 03/25/2016   HGBA1C 6.6 (H) 10/24/2016   MICROALBUR <0.7 10/24/2016    Dg Chest 2 View  Result Date: 11/11/2016 CLINICAL DATA:  Chest pressure today. EXAM: CHEST  2 VIEW COMPARISON:  Radiograph 03/15/2016 FINDINGS: The cardiomediastinal contours are unchanged with atherosclerosis of the thoracic aorta. Heart size is upper normal. There is a hiatal hernia. No pulmonary edema. Subsegmental atelectasis versus scarring in the left lower lung zone. No consolidation, pleural effusion, or pneumothorax. No acute osseous abnormalities are seen. IMPRESSION: 1. No acute abnormality. Subsegmental atelectasis or scarring in the left lung. 2. Borderline cardiomegaly.  Atherosclerotic thoracic aorta. Electronically Signed   By: Jeb Levering M.D.   On: 11/11/2016 17:18       Assessment & Plan:   Problem List Items Addressed This Visit    BMI 36.0-36.9,adult    Discussed diet and exercise.        Chest pain - Primary    EKG as outlined.  SR/SB with no acute ischemic changes when compared to the previous check.  Cardiology reviewed.  No symptoms currently.  Schedule earlier appt with cardiology.  Change metoprolol to 1/2 tablet bid.        Relevant Orders   EKG 12-Lead (Completed)   Coronary artery disease involving native coronary artery of native heart with unstable angina pectoris ([place])    Followed by cardiology.  With the recent chest heaviness and feeling like her "heart is tired" - with increased activity, EKG obtained.  EKG - SR/SB with no acute ischemic changes when compared to previous.  Cardiology reviewed.  Will decrease metoprolol to 1/2 tablet bid.  Schedule earlier f/u with  cardiology.  Discussed the need for reevaluation if symptoms changed or worsened.        Diabetes mellitus ([place])    Low carb diet and exercise.  Follow met b and a1c.        Essential  hypertension, benign (Chronic)    Blood pressure on recheck improved.   Her checks - ok.  Follow met b.  Adjust metoprolol as outlined.          GERD (gastroesophageal reflux disease)    Controlled on current regimen.        Hiatal hernia    Has a large hiatal hernia.  On PPI.  Breathing overall stable.       Hypercholesterolemia (Chronic)    On crestor.  Low cholesterol diet nd exercise.  Follow lipid panel and liver function tests.        Obstructive sleep apnea (Chronic)    CPAP.       SOB (shortness of breath)    Followed by pulmonary and cardiology.  On breo.  Breathing overall stable.       Stress    Overall handling things relatively well.  Follow.        Other Visit Diagnoses    Elevated serum creatinine       Relevant Orders   Basic metabolic panel (Completed)     I spent 40 minutes with the patient and more than 50% of the time was spent in consultation regarding the above.  Time spent discussing current symptoms.  Time also spent discussing planned testing, treatment and plans for f/u.     Einar Pheasant, MD

## 2016-12-05 NOTE — Patient Instructions (Signed)
Decrease metoprolol to 1/2 tablet bid

## 2016-12-05 NOTE — Progress Notes (Signed)
Pre-visit discussion using our clinic review tool. No additional management support is needed unless otherwise documented below in the visit note.  

## 2016-12-06 ENCOUNTER — Other Ambulatory Visit: Payer: Self-pay | Admitting: Internal Medicine

## 2016-12-06 DIAGNOSIS — R7989 Other specified abnormal findings of blood chemistry: Secondary | ICD-10-CM

## 2016-12-06 LAB — BASIC METABOLIC PANEL
BUN: 21 mg/dL (ref 6–23)
CHLORIDE: 100 meq/L (ref 96–112)
CO2: 36 meq/L — AB (ref 19–32)
CREATININE: 1.42 mg/dL — AB (ref 0.40–1.20)
Calcium: 9.6 mg/dL (ref 8.4–10.5)
GFR: 38.01 mL/min — ABNORMAL LOW (ref >60.00)
Glucose, Bld: 103 mg/dL — ABNORMAL HIGH (ref 70–99)
Potassium: 4 mEq/L (ref 3.5–5.1)
Sodium: 141 mEq/L (ref 135–145)

## 2016-12-06 NOTE — Progress Notes (Signed)
Order placed for f/u labs.  

## 2016-12-07 ENCOUNTER — Encounter: Payer: Self-pay | Admitting: Internal Medicine

## 2016-12-07 NOTE — Assessment & Plan Note (Signed)
CPAP.  

## 2016-12-07 NOTE — Assessment & Plan Note (Addendum)
Has a large hiatal hernia.  On PPI.  Breathing overall stable.

## 2016-12-07 NOTE — Assessment & Plan Note (Signed)
Followed by pulmonary and cardiology.  On breo.  Breathing overall stable.

## 2016-12-07 NOTE — Assessment & Plan Note (Signed)
On crestor.  Low cholesterol diet nd exercise.  Follow lipid panel and liver function tests.

## 2016-12-07 NOTE — Assessment & Plan Note (Signed)
Blood pressure on recheck improved.   Her checks - ok.  Follow met b.  Adjust metoprolol as outlined.

## 2016-12-07 NOTE — Assessment & Plan Note (Signed)
Followed by cardiology.  With the recent chest heaviness and feeling like her "heart is tired" - with increased activity, EKG obtained.  EKG - SR/SB with no acute ischemic changes when compared to previous.  Cardiology reviewed.  Will decrease metoprolol to 1/2 tablet bid.  Schedule earlier f/u with cardiology.  Discussed the need for reevaluation if symptoms changed or worsened.

## 2016-12-07 NOTE — Assessment & Plan Note (Signed)
Controlled on current regimen.   

## 2016-12-07 NOTE — Assessment & Plan Note (Signed)
Low carb diet and exercise.  Follow met b and a1c.   

## 2016-12-07 NOTE — Assessment & Plan Note (Signed)
Overall handling things relatively well.  Follow.  

## 2016-12-07 NOTE — Assessment & Plan Note (Signed)
EKG as outlined.  SR/SB with no acute ischemic changes when compared to the previous check.  Cardiology reviewed.  No symptoms currently.  Schedule earlier appt with cardiology.  Change metoprolol to 1/2 tablet bid.

## 2016-12-07 NOTE — Assessment & Plan Note (Signed)
Discussed diet and exercise 

## 2016-12-10 NOTE — Progress Notes (Signed)
Cardiology Office Note  Date:  12/11/2016   ID:  April [name], DOB [birth year], MRN [id number]  PCP:  Einar Pheasant, MD   Chief Complaint  Patient presents with  . other    6 month follow up. Pt. was at [facility] ER with chest pain on 11/11/2016. Meds reviewed by the pt. verbally.     HPI:  April [name] is a pleasant 76 year old woman with history of  coronary artery disease,  40% proximal RCA disease on cardiac catheterization in 01/2010, chronic shortness of breath  STEMI November 2017 stent x2 to her RCA She had atrial fibrillation while in the hospital November 2017 who presents For follow-up of her coronary artery disease ,  Seen in the emergency room 11/11/2016 for chest pain Hospital records reviewed with the patient in detail Constant, worse with movement, located under left breast, squeezing in nature No significant change in shortness of breath symptoms EKG unchanged, cardiac enzymes negative 2 She feels pain was musculoskeletal from lifting heavy items as she is moving  Infrequently takes nitroglycerin for chest pain Chest pain not associated with exertion  Metoprolol dose was decreased down to 25 twice a day given bradycardia and fatigue She is cutting 50 mg pill in half  Denies any complications from her anticoagulation Moving to the [facility] work reviewed with her, total cholesterol at goal, hemoglobin A1c 6.5 Total chol 133, LDL 63  EKG with NSR rate 55 bpm, poor R-wave progression to the anterior precordial leads, left axis deviation   Other past medical history reviewed Had chest pressure, sweating November 2017 in the setting of her STEMI "looked white"  Had atrial fibrillation 24 hours in the hospital , now on eliquis 5 BID  Echocardiogram in 2015 showing normal ejection fraction, borderline elevated right ventricular systolic pressures, diastolic relaxation abnormality.   PMH:   has a past medical history of Arrhythmia; CAD S/P PCI  pRCA Promus DES 3.5 x 16 (4.1 mm), ostRPDA Promus DES 2.5 x 12 (2.7 mm) (03/25/2016); Diverticulitis; Endometriosis; Heart murmur; Hiatal hernia; History of colon polyps (08/1994); Hypercholesterolemia; Hypertension; Migraines; Osteoporosis; Rheumatic fever; Sleep apnea; ST elevation myocardial infarction (STEMI) of inferolateral wall, initial episode of care [facility]) (03/25/2016); and Urine incontinence.  PSH:    Past Surgical History:  Procedure Laterality Date  . ABDOMINAL HYSTERECTOMY     ovaries not removed  . APPENDECTOMY     was removed during hysterectomy  . Breast biopsies     x2  . BREAST BIOPSY Bilateral "years ago"  . BREAST SURGERY Bilateral   . CARDIAC CATHETERIZATION  2011   moderate 40% RCA disease  . CARDIAC CATHETERIZATION  01/2010   Dr. [email or web address] : Only noted 40% RCA  . CARDIAC CATHETERIZATION N/A 03/25/2016   Procedure: Left Heart Cath and Coronary Angiography;  Surgeon: 04/07/2016, MD;  Location: [place] [facility];  Service: Cardiovascular;  Laterality: N/A;  . CARDIAC CATHETERIZATION N/A 03/25/2016   Procedure: Coronary Stent Intervention;  Surgeon: 04/07/2016, MD;  Location: [place] [facility];  Service: Cardiovascular;  Laterality: N/A;  . COLONOSCOPY  2013  . ESOPHAGOGASTRODUODENOSCOPY (EGD) WITH PROPOFOL N/A 03/17/2015   Procedure: ESOPHAGOGASTRODUODENOSCOPY (EGD) WITH PROPOFOL;  Surgeon: 03/30/2015, MD;  Location: [facility];  Service: Gastroenterology;  Laterality: N/A;  . NM MYOVIEW ([place] HX)  07/2015   No evidence ischemia or infarction. EF 66%. Low risk  . TONSILLECTOMY    . TRANSTHORACIC ECHOCARDIOGRAM  10/2013   Normal LV size  and function. EF 55-65%. GR 1 DD. Otherwise normal.    Current Outpatient Prescriptions  Medication Sig Dispense Refill  . acetaminophen (TYLENOL) 500 MG tablet Take 500 mg by mouth daily as needed for headache (pain).    Marland Kitchen amiodarone (PACERONE) 200 MG tablet TAKE ONE (1) TABLET EACH DAY 30 tablet 3  .  amLODipine (NORVASC) 2.5 MG tablet TAKE ONE (1) TABLET EACH DAY 30 tablet 3  . BREO ELLIPTA 100-25 MCG/INH AEPB Inhale 1 puff into the lungs daily. 60 each 5  . cholecalciferol (VITAMIN D) 1000 units tablet Take 1,000 Units by mouth 2 (two) times daily.    . clopidogrel (PLAVIX) 75 MG tablet TAKE ONE (1) TABLET EACH DAY 30 tablet 3  . ELIQUIS 5 MG TABS tablet TAKE ONE TABLET TWICE DAILY 60 tablet 3  . fish oil-omega-3 fatty acids 1000 MG capsule Take 1 g by mouth 2 (two) times daily.     . fluticasone furoate-vilanterol (BREO ELLIPTA) 100-25 MCG/INH AEPB Inhale 1 puff into the lungs daily. 60 each 0  . furosemide (LASIX) 20 MG tablet Take 1 tablet (20 mg total) by mouth daily. 30 tablet 6  . losartan-hydrochlorothiazide (HYZAAR) 100-12.5 MG tablet TAKE ONE (1) TABLET EACH DAY 30 tablet 3  . meloxicam (MOBIC) 15 MG tablet Take 15 mg by mouth daily as needed for pain.     . Menthol, Topical Analgesic, (BIOFREEZE EX) Apply 1 application topically daily as needed (pain).    . metoprolol (LOPRESSOR) 50 MG tablet Take 1 tablet (50 mg total) by mouth 2 (two) times daily. (Patient taking differently: Take 25 mg by mouth 2 (two) times daily. ) 180 tablet 3  . Misc Natural Products (OSTEO BI-FLEX JOINT SHIELD PO) Take 1 tablet by mouth 2 (two) times daily.     . Multiple Vitamin (MULTIVITAMIN WITH MINERALS) TABS tablet Take 0.5 tablets by mouth 2 (two) times daily.    . nitroGLYCERIN (NITROSTAT) 0.4 MG SL tablet Place 1 tablet (0.4 mg total) under the tongue every 5 (five) minutes as needed. 25 tablet 3  . pantoprazole (PROTONIX) 40 MG tablet TAKE ONE (1) TABLET EACH DAY 30 tablet 3  . Polyethyl Glycol-Propyl Glycol (SYSTANE OP) Place 1 drop into both eyes daily as needed (dry eyes).    . potassium chloride (K-DUR) 10 MEQ tablet TAKE ONE (1) TABLET EACH DAY AS NEEDED 30 tablet 3  . PRESCRIPTION MEDICATION Inhale into the lungs at bedtime. CPAP    . ranitidine (ZANTAC) 75 MG tablet Take 75 mg by mouth 2  (two) times daily.    . rosuvastatin (CRESTOR) 40 MG tablet TAKE ONE (1) TABLET EACH DAY 30 tablet 3   No current facility-administered medications for this visit.      Allergies:   Penicillins and Sulfa antibiotics   Social History:  The patient  reports that she has never smoked. She has never used smokeless tobacco. She reports that she does not drink alcohol or use drugs.   Family History:   family history includes Arthritis in her mother; Breast cancer in her unknown relative; Heart attack in her daughter, father, and sister; Heart disease in her father; Hypertension in her mother; Osteoporosis in her mother; Stroke in her brother, daughter, and mother.    Review of Systems: Review of Systems  Constitutional: Positive for malaise/fatigue.  Respiratory: Negative.   Cardiovascular: Negative.   Gastrointestinal: Negative.   Musculoskeletal: Negative.   Neurological: Negative.   Psychiatric/Behavioral: Negative.   All other systems reviewed  and are negative.    PHYSICAL EXAM: VS:  BP 120/60 (BP Location: Left Arm, Patient Position: Sitting, Cuff Size: Normal)   Pulse (!) 56   Ht 4\' 11"  (1.499 m)   Wt 180 lb 8 oz (81.9 kg)   LMP 05/01/1972   BMI 36.46 kg/m  , BMI Body mass index is 36.46 kg/m. GEN: Well nourished, well developed, in no acute distress  HEENT: normal  Neck: no JVD, carotid bruits, or masses Cardiac: RRR; no murmurs, rubs, or gallops,no edema  Respiratory:  clear to auscultation bilaterally, normal work of breathing GI: soft, nontender, nondistended, + BS MS: no deformity or atrophy  Skin: warm and dry, no rash Neuro:  Strength and sensation are intact Psych: euthymic mood, full affect    Recent Labs: 06/26/2016: TSH 1.60 10/24/2016: ALT 20 11/11/2016: Hemoglobin 13.2; Platelets 227 12/05/2016: BUN 21; Creatinine, Ser 1.42; Potassium 4.0; Sodium 141    Lipid Panel Lab Results  Component Value Date   CHOL 151 10/24/2016   HDL 62.60 10/24/2016    LDLCALC 72 10/24/2016   TRIG 83.0 10/24/2016      Wt Readings from Last 3 Encounters:  12/11/16 180 lb 8 oz (81.9 kg)  12/05/16 180 lb 6.4 oz (81.8 kg)  11/11/16 176 lb (79.8 kg)       ASSESSMENT AND PLAN:  Essential hypertension, benign Tolerating metoprolol 25 mg twice a day Recommended she monitor her heart rate and blood pressure If needed could cut the dose down to 12.5 mg twice a day  Hypercholesterolemia Cholesterol is at goal on the current lipid regimen. No changes to the medications were made.  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 No further workup at this time. Continue current medication regimen. Taking nitroglycerin sparingly Recommended she call us if nitroglycerin requirement escalates  CAD S/P PCI pRCA Promus DES 3.5 x 16 (4.1 mm), ostRPDA Promus DES 2.5 x 12 (2.7 mm) We'll continue Plavix. Aspirin on hold as she is on anticoagulation  Paroxysmal atrial fibrillation Maintaining normal sinus rhythm,  continue anticoagulation, elevated CHADS VASC (at least 4)  SOB (shortness of breath) Previously working with cardiac rehabilitation Recommended she continue exercise program after rehabilitation is complete   Total encounter time more than 25 minutes Greater than 50% was spent in counseling and coordination of care with the patient  Disposition:   F/U  12 months   Orders Placed This Encounter  Procedures  . EKG 12-Lead     Signed, Esmond Plants, M.D., Ph.D. 12/11/2016  [place], [place]

## 2016-12-11 ENCOUNTER — Encounter: Payer: Self-pay | Admitting: Cardiovascular Disease

## 2016-12-11 ENCOUNTER — Telehealth: Payer: Self-pay | Admitting: Cardiovascular Disease

## 2016-12-11 ENCOUNTER — Ambulatory Visit (INDEPENDENT_AMBULATORY_CARE_PROVIDER_SITE_OTHER): Payer: Medicare Other | Admitting: Cardiovascular Disease

## 2016-12-11 VITALS — BP 120/60 | HR 56 | Ht 59.0 in | Wt 180.5 lb

## 2016-12-11 DIAGNOSIS — I10 Essential (primary) hypertension: Secondary | ICD-10-CM | POA: Diagnosis not present

## 2016-12-11 DIAGNOSIS — I480 Paroxysmal atrial fibrillation: Secondary | ICD-10-CM

## 2016-12-11 DIAGNOSIS — E7800 Pure hypercholesterolemia, unspecified: Secondary | ICD-10-CM | POA: Diagnosis not present

## 2016-12-11 DIAGNOSIS — I25110 Atherosclerotic heart disease of native coronary artery with unstable angina pectoris: Secondary | ICD-10-CM | POA: Diagnosis not present

## 2016-12-11 DIAGNOSIS — I200 Unstable angina: Secondary | ICD-10-CM

## 2016-12-11 DIAGNOSIS — E1159 Type 2 diabetes mellitus with other circulatory complications: Secondary | ICD-10-CM | POA: Diagnosis not present

## 2016-12-11 DIAGNOSIS — I2119 ST elevation (STEMI) myocardial infarction involving other coronary artery of inferior wall: Secondary | ICD-10-CM | POA: Diagnosis not present

## 2016-12-11 NOTE — Telephone Encounter (Signed)
Pt calling stating she was seen today  Forgot to ask if she would be okay to do a Colonoscopy  It is not scheduled yet.  Dr Jamal Collin would be the one doing the procedure  Please advise

## 2016-12-11 NOTE — Patient Instructions (Addendum)

## 2016-12-11 NOTE — Telephone Encounter (Signed)
I would wait until after 03/2017, preferably into 2019 Need at least a year on medications after MI 03/2016

## 2016-12-12 ENCOUNTER — Telehealth: Payer: Self-pay | Admitting: *Deleted

## 2016-12-12 NOTE — Telephone Encounter (Signed)
Patient called and stated that she talked to [facility] and he really wants her to wait to have a colonoscopy next year 2019. Patient is okay to see Dr.Byrnett when the time comes.

## 2016-12-12 NOTE — Telephone Encounter (Signed)
Spoke with patient and reviewed Dr. Donivan Scull recommendations to delay so that she can remain on her medications for a year. She verbalized agreement with plan and had no further questions at this time.

## 2016-12-13 ENCOUNTER — Other Ambulatory Visit: Payer: Medicare Other

## 2016-12-15 ENCOUNTER — Other Ambulatory Visit (INDEPENDENT_AMBULATORY_CARE_PROVIDER_SITE_OTHER): Payer: Medicare Other

## 2016-12-15 DIAGNOSIS — R7989 Other specified abnormal findings of blood chemistry: Secondary | ICD-10-CM | POA: Diagnosis not present

## 2016-12-15 LAB — URINALYSIS, ROUTINE W REFLEX MICROSCOPIC
BILIRUBIN URINE: NEGATIVE
Hgb urine dipstick: NEGATIVE
KETONES UR: NEGATIVE
NITRITE: NEGATIVE
RBC / HPF: NONE SEEN (ref 0–?)
TOTAL PROTEIN, URINE-UPE24: NEGATIVE
URINE GLUCOSE: NEGATIVE
UROBILINOGEN UA: 0.2 (ref 0.0–1.0)
pH: 6.5 (ref 5.0–8.0)

## 2016-12-15 LAB — BASIC METABOLIC PANEL
BUN: 17 mg/dL (ref 6–23)
CHLORIDE: 103 meq/L (ref 96–112)
CO2: 32 mEq/L (ref 19–32)
Calcium: 8.8 mg/dL (ref 8.4–10.5)
Creatinine, Ser: 1.04 mg/dL (ref 0.40–1.20)
GFR: 54.45 mL/min — ABNORMAL LOW (ref >60.00)
GLUCOSE: 122 mg/dL — AB (ref 70–99)
POTASSIUM: 4 meq/L (ref 3.5–5.1)
Sodium: 141 mEq/L (ref 135–145)

## 2016-12-19 ENCOUNTER — Telehealth: Payer: Self-pay | Admitting: Internal Medicine

## 2016-12-19 NOTE — Telephone Encounter (Signed)
See lab results notes

## 2016-12-19 NOTE — Telephone Encounter (Signed)
Pt called back returning your call. Please advise, thank you!  Call pt @ [phone]

## 2016-12-29 ENCOUNTER — Encounter: Payer: Self-pay | Admitting: Internal Medicine

## 2016-12-29 ENCOUNTER — Ambulatory Visit (INDEPENDENT_AMBULATORY_CARE_PROVIDER_SITE_OTHER): Payer: Medicare Other | Admitting: Internal Medicine

## 2016-12-29 DIAGNOSIS — Z23 Encounter for immunization: Secondary | ICD-10-CM | POA: Diagnosis not present

## 2016-12-29 DIAGNOSIS — F439 Reaction to severe stress, unspecified: Secondary | ICD-10-CM

## 2016-12-29 DIAGNOSIS — E1159 Type 2 diabetes mellitus with other circulatory complications: Secondary | ICD-10-CM

## 2016-12-29 DIAGNOSIS — I2119 ST elevation (STEMI) myocardial infarction involving other coronary artery of inferior wall: Secondary | ICD-10-CM

## 2016-12-29 DIAGNOSIS — I480 Paroxysmal atrial fibrillation: Secondary | ICD-10-CM | POA: Diagnosis not present

## 2016-12-29 DIAGNOSIS — K219 Gastro-esophageal reflux disease without esophagitis: Secondary | ICD-10-CM

## 2016-12-29 DIAGNOSIS — E7800 Pure hypercholesterolemia, unspecified: Secondary | ICD-10-CM | POA: Diagnosis not present

## 2016-12-29 DIAGNOSIS — I25110 Atherosclerotic heart disease of native coronary artery with unstable angina pectoris: Secondary | ICD-10-CM

## 2016-12-29 DIAGNOSIS — I10 Essential (primary) hypertension: Secondary | ICD-10-CM | POA: Diagnosis not present

## 2016-12-29 DIAGNOSIS — Z6836 Body mass index (BMI) 36.0-36.9, adult: Secondary | ICD-10-CM

## 2016-12-29 DIAGNOSIS — R0602 Shortness of breath: Secondary | ICD-10-CM

## 2016-12-29 DIAGNOSIS — K449 Diaphragmatic hernia without obstruction or gangrene: Secondary | ICD-10-CM | POA: Diagnosis not present

## 2016-12-29 DIAGNOSIS — G4733 Obstructive sleep apnea (adult) (pediatric): Secondary | ICD-10-CM

## 2016-12-29 NOTE — Progress Notes (Signed)
Patient ID: April [name], female   DOB: 17-Jun-1940, 76 y.o.   MRN: [id number]   Subjective:    Patient ID: April [name], female    DOB: [birth year], 76 y.o.   MRN: [id number]  HPI  Patient with past history of CAD, hypercholesterolemia and hypertension.  She comes in today to follow up on these issues as well as for a complete physical exam.  She was having problems with fatigue and sob.  See last note.  Metoprolol was adjusted.  Saw Dr Rockey Situ.  She is feeling better.  Energy is better.  Breathing stable.  No chest pain.  No acid reflux.  No abdominal pain.  Bowels moving.  Increased stress.  Overall she feels she is handling things relatively well.     Past Medical History:  Diagnosis Date  . Arrhythmia    history of an irregular heart beat  . CAD S/P PCI pRCA Promus DES 3.5 x 16 (4.1 mm), ostRPDA Promus DES 2.5 x 12 (2.7 mm) 03/25/2016  . Diverticulitis   . Endometriosis   . Heart murmur    no significant valvular lesion noted on Echo  . Hiatal hernia   . History of colon polyps 08/1994  . Hypercholesterolemia   . Hypertension   . Migraines    history of migraines  . Osteoporosis   . Rheumatic fever   . Sleep apnea    CPAP  . ST elevation myocardial infarction (STEMI) of inferolateral wall, initial episode of care ([place]) 03/25/2016  . Urine incontinence    Past Surgical History:  Procedure Laterality Date  . ABDOMINAL HYSTERECTOMY     ovaries not removed  . APPENDECTOMY     was removed during hysterectomy  . Breast biopsies     x2  . BREAST BIOPSY Bilateral "years ago"  . BREAST SURGERY Bilateral   . CARDIAC CATHETERIZATION  2011   moderate 40% RCA disease  . CARDIAC CATHETERIZATION  01/2010   Dr. Gollan@[email or web address] : Only noted 40% RCA  . CARDIAC CATHETERIZATION N/A 03/25/2016   Procedure: Left Heart Cath and Coronary Angiography;  Surgeon: 04/07/2016, MD;  Location: [place] [facility];  Service: Cardiovascular;  Laterality: N/A;  . CARDIAC CATHETERIZATION N/A  03/25/2016   Procedure: Coronary Stent Intervention;  Surgeon: 04/07/2016, MD;  Location: [place] [facility];  Service: Cardiovascular;  Laterality: N/A;  . COLONOSCOPY  2013  . ESOPHAGOGASTRODUODENOSCOPY (EGD) WITH PROPOFOL N/A 03/17/2015   Procedure: ESOPHAGOGASTRODUODENOSCOPY (EGD) WITH PROPOFOL;  Surgeon: 03/30/2015, MD;  Location: [facility];  Service: Gastroenterology;  Laterality: N/A;  . NM MYOVIEW ([place] HX)  07/2015   No evidence ischemia or infarction. EF 66%. Low risk  . TONSILLECTOMY    . TRANSTHORACIC ECHOCARDIOGRAM  10/2013   Normal LV size and function. EF 55-65%. GR 1 DD. Otherwise normal.   Family History  Problem Relation Age of Onset  . Arthritis Mother   . Stroke Mother   . Hypertension Mother   . Osteoporosis Mother   . Heart disease Father        MI  . Heart attack Father   . Stroke Brother   . Heart attack Sister   . Breast cancer Unknown        first cousin x 2  . Stroke Daughter   . Heart attack Daughter   . Colon cancer Neg Hx    Social History   Social History  . Marital status: Widowed    Spouse name: N/A  .  Number of children: 2  . Years of education: N/A   Social History Main Topics  . Smoking status: Never Smoker  . Smokeless tobacco: Never Used  . Alcohol use No  . Drug use: No  . Sexual activity: No   Other Topics Concern  . None   Social History Narrative  . None    Outpatient Encounter Prescriptions as of 12/29/2016  Medication Sig  . acetaminophen (TYLENOL) 500 MG tablet Take 500 mg by mouth daily as needed for headache (pain).  Marland Kitchen amiodarone (PACERONE) 200 MG tablet TAKE ONE (1) TABLET EACH DAY  . amLODipine (NORVASC) 2.5 MG tablet TAKE ONE (1) TABLET EACH DAY  . BREO ELLIPTA 100-25 MCG/INH AEPB Inhale 1 puff into the lungs daily.  . cholecalciferol (VITAMIN D) 1000 units tablet Take 1,000 Units by mouth 2 (two) times daily.  . clopidogrel (PLAVIX) 75 MG tablet TAKE ONE (1) TABLET EACH DAY  . ELIQUIS 5 MG  TABS tablet TAKE ONE TABLET TWICE DAILY  . fish oil-omega-3 fatty acids 1000 MG capsule Take 1 g by mouth 2 (two) times daily.   . fluticasone furoate-vilanterol (BREO ELLIPTA) 100-25 MCG/INH AEPB Inhale 1 puff into the lungs daily.  . furosemide (LASIX) 20 MG tablet Take 1 tablet (20 mg total) by mouth daily.  Marland Kitchen losartan-hydrochlorothiazide (HYZAAR) 100-12.5 MG tablet TAKE ONE (1) TABLET EACH DAY  . meloxicam (MOBIC) 15 MG tablet Take 15 mg by mouth daily as needed for pain.   . Menthol, Topical Analgesic, (BIOFREEZE EX) Apply 1 application topically daily as needed (pain).  . metoprolol (LOPRESSOR) 50 MG tablet Take 1 tablet (50 mg total) by mouth 2 (two) times daily. (Patient taking differently: Take 25 mg by mouth daily. )  . Misc Natural Products (OSTEO BI-FLEX JOINT SHIELD PO) Take 1 tablet by mouth 2 (two) times daily.   . Multiple Vitamin (MULTIVITAMIN WITH MINERALS) TABS tablet Take 0.5 tablets by mouth 2 (two) times daily.  . nitroGLYCERIN (NITROSTAT) 0.4 MG SL tablet Place 1 tablet (0.4 mg total) under the tongue every 5 (five) minutes as needed.  . pantoprazole (PROTONIX) 40 MG tablet TAKE ONE (1) TABLET EACH DAY  . Polyethyl Glycol-Propyl Glycol (SYSTANE OP) Place 1 drop into both eyes daily as needed (dry eyes).  . potassium chloride (K-DUR) 10 MEQ tablet TAKE ONE (1) TABLET EACH DAY AS NEEDED  . PRESCRIPTION MEDICATION Inhale into the lungs at bedtime. CPAP  . ranitidine (ZANTAC) 75 MG tablet Take 75 mg by mouth 2 (two) times daily.  . rosuvastatin (CRESTOR) 40 MG tablet TAKE ONE (1) TABLET EACH DAY  . [DISCONTINUED] losartan-hydrochlorothiazide (HYZAAR) 100-12.5 MG tablet TAKE ONE (1) TABLET EACH DAY   No facility-administered encounter medications on file as of 12/29/2016.     Review of Systems  Constitutional: Negative for appetite change and unexpected weight change.  HENT: Negative for congestion and sinus pressure.   Eyes: Negative for pain and visual disturbance.    Respiratory: Negative for cough and chest tightness.        Breathing stable.    Cardiovascular: Negative for chest pain and palpitations.  Gastrointestinal: Negative for abdominal pain, diarrhea, nausea and vomiting.  Genitourinary: Negative for difficulty urinating and dysuria.  Musculoskeletal: Negative for back pain and joint swelling.  Skin: Negative for color change and rash.  Neurological: Negative for dizziness, light-headedness and headaches.  Hematological: Negative for adenopathy. Does not bruise/bleed easily.  Psychiatric/Behavioral: Negative for agitation and dysphoric mood.  Objective:     Blood pressure rechecked by me:  128/68  Physical Exam  Constitutional: She is oriented to person, place, and time. She appears well-developed and well-nourished. No distress.  HENT:  Nose: Nose normal.  Mouth/Throat: Oropharynx is clear and moist.  Eyes: Right eye exhibits no discharge. Left eye exhibits no discharge. No scleral icterus.  Neck: Neck supple. No thyromegaly present.  Cardiovascular: Normal rate and regular rhythm.   Pulmonary/Chest: Breath sounds normal. No accessory muscle usage. No tachypnea. No respiratory distress. She has no decreased breath sounds. She has no wheezes. She has no rhonchi. Right breast exhibits no inverted nipple, no mass, no nipple discharge and no tenderness (no axillary adenopathy). Left breast exhibits no inverted nipple, no mass, no nipple discharge and no tenderness (no axilarry adenopathy).  Abdominal: Soft. Bowel sounds are normal. There is no tenderness.  Musculoskeletal: She exhibits no edema or tenderness.  Lymphadenopathy:    She has no cervical adenopathy.  Neurological: She is alert and oriented to person, place, and time.  Skin: Skin is warm. No rash noted. No erythema.  Psychiatric: She has a normal mood and affect. Her behavior is normal.    BP 118/62 (BP Location: Left Arm, Patient Position: Sitting, Cuff Size: Normal)    Pulse 64   Temp 98.8 F (37.1 C) (Oral)   Resp 12   Ht 4' 11"  (1.499 m)   Wt 181 lb 9.6 oz (82.4 kg)   LMP 05/01/1972   SpO2 95%   BMI 36.68 kg/m  Wt Readings from Last 3 Encounters:  12/29/16 181 lb 9.6 oz (82.4 kg)  12/11/16 180 lb 8 oz (81.9 kg)  12/05/16 180 lb 6.4 oz (81.8 kg)     Lab Results  Component Value Date   WBC 7.5 11/11/2016   HGB 13.2 11/11/2016   HCT 38.9 11/11/2016   PLT 227 11/11/2016   GLUCOSE 122 (H) 12/15/2016   CHOL 151 10/24/2016   TRIG 83.0 10/24/2016   HDL 62.60 10/24/2016   LDLCALC 72 10/24/2016   ALT 20 10/24/2016   AST 24 10/24/2016   NA 141 12/15/2016   K 4.0 12/15/2016   CL 103 12/15/2016   CREATININE 1.04 12/15/2016   BUN 17 12/15/2016   CO2 32 12/15/2016   TSH 1.60 06/26/2016   INR 0.99 03/25/2016   HGBA1C 6.6 (H) 10/24/2016   MICROALBUR <0.7 10/24/2016    Dg Chest 2 View  Result Date: 11/11/2016 CLINICAL DATA:  Chest pressure today. EXAM: CHEST  2 VIEW COMPARISON:  Radiograph 03/15/2016 FINDINGS: The cardiomediastinal contours are unchanged with atherosclerosis of the thoracic aorta. Heart size is upper normal. There is a hiatal hernia. No pulmonary edema. Subsegmental atelectasis versus scarring in the left lower lung zone. No consolidation, pleural effusion, or pneumothorax. No acute osseous abnormalities are seen. IMPRESSION: 1. No acute abnormality. Subsegmental atelectasis or scarring in the left lung. 2. Borderline cardiomegaly.  Atherosclerotic thoracic aorta. Electronically Signed   By: Jeb Levering M.D.   On: 11/11/2016 17:18       Assessment & Plan:   Problem List Items Addressed This Visit    BMI 36.0-36.9,adult    Diet and exercise.        Coronary artery disease involving native coronary artery of native heart with unstable angina pectoris [facility])    Saw cardiology.  See Dr Donivan Scull note.  Continue risk factor modification.  Feels better with adjustment in metoprolol.        Relevant Medications  losartan-hydrochlorothiazide (HYZAAR) 100-12.5 MG tablet   Diabetes mellitus (HCC)    Low carb diet and exercise.  Follow met b and a1c.        Relevant Medications   losartan-hydrochlorothiazide (HYZAAR) 100-12.5 MG tablet   Other Relevant Orders   Hemoglobin A1c   Essential hypertension, benign (Chronic)    Blood pressure under good control.  Continue same medication regimen.  Follow pressures.  Follow metabolic panel.        Relevant Medications   losartan-hydrochlorothiazide (HYZAAR) 100-12.5 MG tablet   Other Relevant Orders   Basic metabolic panel   GERD (gastroesophageal reflux disease)    Controlled on current regimen.        Hiatal hernia    Has a large hiatal hernia.  On PPI.  Follow.        Hypercholesterolemia (Chronic)    On crestor.  Low cholesterol diet and exercise.  Follow lipid panel and liver function tests.        Relevant Medications   losartan-hydrochlorothiazide (HYZAAR) 100-12.5 MG tablet   Other Relevant Orders   Hepatic function panel   Lipid panel   Obstructive sleep apnea (Chronic)    CPAP.       Paroxysmal atrial fibrillation (HCC)    Maintaining SR.  Follow.        Relevant Medications   losartan-hydrochlorothiazide (HYZAAR) 100-12.5 MG tablet   SOB (shortness of breath)    Breathing stable.  Followed by pulmonary and cardiology.       Stress    Increased stress.  Overall she feels she is doing well.  Follow.        Other Visit Diagnoses    Encounter for immunization       Relevant Medications   losartan-hydrochlorothiazide (HYZAAR) 100-12.5 MG tablet   Other Relevant Orders   Flu vaccine HIGH DOSE PF (Completed)       Einar Pheasant, MD

## 2016-12-31 MED ORDER — LOSARTAN POTASSIUM-HCTZ 100-12.5 MG PO TABS: ORAL_TABLET | ORAL | 3 refills | Status: DC

## 2016-12-31 NOTE — Assessment & Plan Note (Signed)
Low carb diet and exercise.  Follow met b and a1c.   

## 2016-12-31 NOTE — Assessment & Plan Note (Signed)
Controlled on current regimen.   

## 2016-12-31 NOTE — Assessment & Plan Note (Signed)
Saw cardiology.  See Dr Donivan Scull note.  Continue risk factor modification.  Feels better with adjustment in metoprolol.

## 2016-12-31 NOTE — Assessment & Plan Note (Signed)
Diet and exercise.   

## 2016-12-31 NOTE — Assessment & Plan Note (Signed)
Maintaining SR.  Follow.

## 2016-12-31 NOTE — Assessment & Plan Note (Signed)
Blood pressure under good control.  Continue same medication regimen.  Follow pressures.  Follow metabolic panel.   

## 2016-12-31 NOTE — Assessment & Plan Note (Signed)
Has a large hiatal hernia.  On PPI. Follow.  

## 2016-12-31 NOTE — Assessment & Plan Note (Signed)
On crestor.  Low cholesterol diet and exercise.  Follow lipid panel and liver function tests.   

## 2016-12-31 NOTE — Assessment & Plan Note (Signed)
CPAP.  

## 2016-12-31 NOTE — Assessment & Plan Note (Signed)
Increased stress.  Overall she feels she is doing well.  Follow.

## 2016-12-31 NOTE — Assessment & Plan Note (Signed)
Breathing stable.  Followed by pulmonary and cardiology.

## 2017-01-18 ENCOUNTER — Other Ambulatory Visit: Payer: Self-pay | Admitting: Cardiology

## 2017-01-18 ENCOUNTER — Other Ambulatory Visit: Payer: Self-pay | Admitting: Cardiovascular Disease

## 2017-01-29 ENCOUNTER — Telehealth: Payer: Self-pay | Admitting: Internal Medicine

## 2017-01-29 NOTE — Telephone Encounter (Signed)
Patient says she was examined by Cecille Rubin at [facility] and they told her that she had an infection and call us for an antibiotic. She has a productive cough, chills, congestion. She says she felt like she was running a fever yesterday but not today. Patient says she went to the beach with a group of people from [place], patient and the person she roomed with have the same symptoms.

## 2017-01-29 NOTE — Telephone Encounter (Signed)
Pt called and stated that she went to the health clinic at [facility] and they told her that she had an infection and to call the PCP for an antibiotic. Pt states that she is coughing, mucous, chills.Please advise, thank you!  Call pt @ [phone]

## 2017-01-29 NOTE — Telephone Encounter (Signed)
Patient refused to go to urgent care to be evaluated, offered patient an appointment for Wednesday morning with [facility] and patient declined. Patient said she would call ENT and try to get abx.

## 2017-01-29 NOTE — Telephone Encounter (Signed)
If having fever, cough and congestion - needs to be seen to determine best treatment.  If no acute spots here then to acute care and we can f/u after.

## 2017-02-19 ENCOUNTER — Other Ambulatory Visit: Payer: Self-pay | Admitting: Cardiovascular Disease

## 2017-03-20 ENCOUNTER — Other Ambulatory Visit: Payer: Self-pay | Admitting: Cardiovascular Disease

## 2017-03-20 DIAGNOSIS — J301 Allergic rhinitis due to pollen: Secondary | ICD-10-CM | POA: Diagnosis not present

## 2017-03-20 DIAGNOSIS — R490 Dysphonia: Secondary | ICD-10-CM | POA: Diagnosis not present

## 2017-04-02 ENCOUNTER — Other Ambulatory Visit (INDEPENDENT_AMBULATORY_CARE_PROVIDER_SITE_OTHER): Payer: Medicare Other

## 2017-04-02 DIAGNOSIS — E1159 Type 2 diabetes mellitus with other circulatory complications: Secondary | ICD-10-CM | POA: Diagnosis not present

## 2017-04-02 DIAGNOSIS — I10 Essential (primary) hypertension: Secondary | ICD-10-CM | POA: Diagnosis not present

## 2017-04-02 DIAGNOSIS — E7800 Pure hypercholesterolemia, unspecified: Secondary | ICD-10-CM | POA: Diagnosis not present

## 2017-04-02 LAB — HEPATIC FUNCTION PANEL
ALK PHOS: 50 U/L (ref 39–117)
ALT: 26 U/L (ref 0–35)
AST: 30 U/L (ref 0–37)
Albumin: 3.9 g/dL (ref 3.5–5.2)
BILIRUBIN DIRECT: 0 mg/dL (ref 0.0–0.3)
TOTAL PROTEIN: 6.6 g/dL (ref 6.0–8.3)
Total Bilirubin: 0.5 mg/dL (ref 0.2–1.2)

## 2017-04-02 LAB — LIPID PANEL
CHOLESTEROL: 159 mg/dL (ref 0–200)
HDL: 68.9 mg/dL (ref >39.00)
LDL Cholesterol: 75 mg/dL (ref 0–99)
NonHDL: 89.73
TRIGLYCERIDES: 74 mg/dL (ref 0.0–149.0)
Total CHOL/HDL Ratio: 2
VLDL: 14.8 mg/dL (ref 0.0–40.0)

## 2017-04-02 LAB — BASIC METABOLIC PANEL
BUN: 16 mg/dL (ref 6–23)
CALCIUM: 9.7 mg/dL (ref 8.4–10.5)
CO2: 33 mEq/L — ABNORMAL HIGH (ref 19–32)
Chloride: 103 mEq/L (ref 96–112)
Creatinine, Ser: 1.08 mg/dL (ref 0.40–1.20)
GFR: 52.09 mL/min — AB (ref >60.00)
GLUCOSE: 105 mg/dL — AB (ref 70–99)
Potassium: 4.5 mEq/L (ref 3.5–5.1)
Sodium: 143 mEq/L (ref 135–145)

## 2017-04-02 LAB — HEMOGLOBIN A1C: Hgb A1c MFr Bld: 6.3 % (ref 4.6–6.5)

## 2017-04-05 ENCOUNTER — Encounter: Payer: Self-pay | Admitting: Internal Medicine

## 2017-04-05 ENCOUNTER — Ambulatory Visit (INDEPENDENT_AMBULATORY_CARE_PROVIDER_SITE_OTHER): Payer: Medicare Other | Admitting: Internal Medicine

## 2017-04-05 DIAGNOSIS — I10 Essential (primary) hypertension: Secondary | ICD-10-CM | POA: Diagnosis not present

## 2017-04-05 DIAGNOSIS — K219 Gastro-esophageal reflux disease without esophagitis: Secondary | ICD-10-CM | POA: Diagnosis not present

## 2017-04-05 DIAGNOSIS — I2119 ST elevation (STEMI) myocardial infarction involving other coronary artery of inferior wall: Secondary | ICD-10-CM | POA: Diagnosis not present

## 2017-04-05 DIAGNOSIS — E7800 Pure hypercholesterolemia, unspecified: Secondary | ICD-10-CM

## 2017-04-05 DIAGNOSIS — I480 Paroxysmal atrial fibrillation: Secondary | ICD-10-CM

## 2017-04-05 DIAGNOSIS — Z6837 Body mass index (BMI) 37.0-37.9, adult: Secondary | ICD-10-CM | POA: Diagnosis not present

## 2017-04-05 DIAGNOSIS — I25110 Atherosclerotic heart disease of native coronary artery with unstable angina pectoris: Secondary | ICD-10-CM

## 2017-04-05 DIAGNOSIS — F439 Reaction to severe stress, unspecified: Secondary | ICD-10-CM

## 2017-04-05 DIAGNOSIS — E1159 Type 2 diabetes mellitus with other circulatory complications: Secondary | ICD-10-CM | POA: Diagnosis not present

## 2017-04-05 DIAGNOSIS — G4733 Obstructive sleep apnea (adult) (pediatric): Secondary | ICD-10-CM | POA: Diagnosis not present

## 2017-04-05 NOTE — Progress Notes (Signed)
Patient ID: April [name], female   DOB: [birth year], 76 y.o.   MRN: [id number]   Subjective:    Patient ID: April [name], female    DOB: 04-17-1941, 76 y.o.   MRN: [id number]  HPI  Patient here for a scheduled follow up.  She reports she is doing relatively well.  Has seen ENT.  Being referred to neurology.  Has appt with Dr Manuella Ghazi 04/13/17.  Swallowing ok.  No chest pain.  Breathing stable.  No increased cough or congestion.  No abdominal pain.  Bowels moving.  No urine change.  Increased stress.  Discussed with her today.  Overall ok and does not feel needs anything more at this time.     Past Medical History:  Diagnosis Date  . Arrhythmia    history of an irregular heart beat  . CAD S/P PCI pRCA Promus DES 3.5 x 16 (4.1 mm), ostRPDA Promus DES 2.5 x 12 (2.7 mm) 03/25/2016  . Diverticulitis   . Endometriosis   . Heart murmur    no significant valvular lesion noted on Echo  . Hiatal hernia   . History of colon polyps 08/1994  . Hypercholesterolemia   . Hypertension   . Migraines    history of migraines  . Osteoporosis   . Rheumatic fever   . Sleep apnea    CPAP  . ST elevation myocardial infarction (STEMI) of inferolateral wall, initial episode of care ([place]) 03/25/2016  . Urine incontinence    Past Surgical History:  Procedure Laterality Date  . ABDOMINAL HYSTERECTOMY     ovaries not removed  . APPENDECTOMY     was removed during hysterectomy  . Breast biopsies     x2  . BREAST BIOPSY Bilateral "years ago"  . BREAST SURGERY Bilateral   . CARDIAC CATHETERIZATION  2011   moderate 40% RCA disease  . CARDIAC CATHETERIZATION  01/2010   Dr. Suzie Portela : Only noted 40% RCA  . CARDIAC CATHETERIZATION N/A 03/25/2016   Procedure: Left Heart Cath and Coronary Angiography;  Surgeon: Leonie Man, MD;  Location: [place] [facility];  Service: Cardiovascular;  Laterality: N/A;  . CARDIAC CATHETERIZATION N/A 03/25/2016   Procedure: Coronary Stent Intervention;  Surgeon: Leonie Man, MD;  Location: [place] [facility];  Service: Cardiovascular;  Laterality: N/A;  . COLONOSCOPY  2013  . ESOPHAGOGASTRODUODENOSCOPY (EGD) WITH PROPOFOL N/A 03/17/2015   Procedure: ESOPHAGOGASTRODUODENOSCOPY (EGD) WITH PROPOFOL;  Surgeon: Christene Lye, MD;  Location: [facility];  Service: Gastroenterology;  Laterality: N/A;  . NM MYOVIEW ([place] HX)  07/2015   No evidence ischemia or infarction. EF 66%. Low risk  . TONSILLECTOMY    . TRANSTHORACIC ECHOCARDIOGRAM  10/2013   Normal LV size and function. EF 55-65%. GR 1 DD. Otherwise normal.   Family History  Problem Relation Age of Onset  . Arthritis Mother   . Stroke Mother   . Hypertension Mother   . Osteoporosis Mother   . Heart disease Father        MI  . Heart attack Father   . Stroke Brother   . Heart attack Sister   . Breast cancer Unknown        first cousin x 2  . Stroke Daughter   . Heart attack Daughter   . Colon cancer Neg Hx    Social History   Socioeconomic History  . Marital status: Widowed    Spouse name: None  . Number of children: 2  . Years of  education: None  . Highest education level: None  Social Needs  . Financial resource strain: None  . Food insecurity - worry: None  . Food insecurity - inability: None  . Transportation needs - medical: None  . Transportation needs - non-medical: None  Occupational History  . None  Tobacco Use  . Smoking status: Never Smoker  . Smokeless tobacco: Never Used  Substance and Sexual Activity  . Alcohol use: No    Alcohol/week: 0.0 oz  . Drug use: No  . Sexual activity: No  Other Topics Concern  . None  Social History Narrative  . None    Outpatient Encounter Medications as of 04/05/2017  Medication Sig  . acetaminophen (TYLENOL) 500 MG tablet Take 500 mg by mouth daily as needed for headache (pain).  Marland Kitchen amiodarone (PACERONE) 200 MG tablet TAKE ONE (1) TABLET EACH DAY  . amLODipine (NORVASC) 2.5 MG tablet TAKE ONE (1) TABLET EACH DAY  .  BREO ELLIPTA 100-25 MCG/INH AEPB Inhale 1 puff into the lungs daily.  . cholecalciferol (VITAMIN D) 1000 units tablet Take 1,000 Units by mouth 2 (two) times daily.  . clopidogrel (PLAVIX) 75 MG tablet TAKE ONE (1) TABLET EACH DAY  . ELIQUIS 5 MG TABS tablet TAKE ONE TABLET TWICE DAILY  . fish oil-omega-3 fatty acids 1000 MG capsule Take 1 g by mouth 2 (two) times daily.   . furosemide (LASIX) 20 MG tablet TAKE ONE (1) TABLET EACH DAY  . losartan-hydrochlorothiazide (HYZAAR) 100-12.5 MG tablet TAKE ONE (1) TABLET EACH DAY  . meloxicam (MOBIC) 15 MG tablet Take 15 mg by mouth daily as needed for pain.   . Menthol, Topical Analgesic, (BIOFREEZE EX) Apply 1 application topically daily as needed (pain).  . metoprolol (LOPRESSOR) 50 MG tablet Take 1 tablet (50 mg total) by mouth 2 (two) times daily. (Patient taking differently: Take 25 mg by mouth daily. )  . Misc Natural Products (OSTEO BI-FLEX JOINT SHIELD PO) Take 1 tablet by mouth 2 (two) times daily.   . Multiple Vitamin (MULTIVITAMIN WITH MINERALS) TABS tablet Take 0.5 tablets by mouth 2 (two) times daily.  . nitroGLYCERIN (NITROSTAT) 0.4 MG SL tablet Place 1 tablet (0.4 mg total) under the tongue every 5 (five) minutes as needed.  . pantoprazole (PROTONIX) 40 MG tablet TAKE ONE (1) TABLET EACH DAY  . Polyethyl Glycol-Propyl Glycol (SYSTANE OP) Place 1 drop into both eyes daily as needed (dry eyes).  . potassium chloride (K-DUR) 10 MEQ tablet TAKE ONE (1) TABLET EACH DAY AS NEEDED  . PRESCRIPTION MEDICATION Inhale into the lungs at bedtime. CPAP  . ranitidine (ZANTAC) 75 MG tablet Take 75 mg by mouth 2 (two) times daily.  . rosuvastatin (CRESTOR) 40 MG tablet TAKE ONE (1) TABLET EACH DAY  . [DISCONTINUED] fluticasone furoate-vilanterol (BREO ELLIPTA) 100-25 MCG/INH AEPB Inhale 1 puff into the lungs daily.   No facility-administered encounter medications on file as of 04/05/2017.     Review of Systems  Constitutional: Negative for appetite  change and unexpected weight change.  HENT: Negative for congestion and sinus pressure.   Respiratory: Negative for cough, chest tightness and shortness of breath.   Cardiovascular: Negative for chest pain and palpitations.  Gastrointestinal: Negative for abdominal pain, diarrhea, nausea and vomiting.  Genitourinary: Negative for difficulty urinating and dysuria.  Musculoskeletal: Negative for joint swelling and myalgias.  Skin: Negative for color change and rash.  Neurological: Negative for dizziness, light-headedness and headaches.  Psychiatric/Behavioral: Negative for agitation and dysphoric mood.  Increased stress as outlined.         Objective:     Blood pressure rechecked by me:  134/64  Physical Exam  Constitutional: She appears well-developed and well-nourished. No distress.  HENT:  Nose: Nose normal.  Mouth/Throat: Oropharynx is clear and moist.  Neck: Neck supple. No thyromegaly present.  Cardiovascular: Normal rate and regular rhythm.  Pulmonary/Chest: Breath sounds normal. No respiratory distress. She has no wheezes.  Abdominal: Soft. Bowel sounds are normal. There is no tenderness.  Musculoskeletal: She exhibits no tenderness.  No increased pedal and ankle edema.  Stable.    Lymphadenopathy:    She has no cervical adenopathy.  Skin: No rash noted. No erythema.  Psychiatric: She has a normal mood and affect. Her behavior is normal.    BP 124/82   Pulse 60   Temp 98.3 F (36.8 C)   Resp 18   Ht _0  (1.499 m)   Wt 187 lb 2 oz (84.9 kg)   LMP 05/01/1972   SpO2 97%   BMI 37.79 kg/m  Wt Readings from Last 3 Encounters:  04/05/17 187 lb 2 oz (84.9 kg)  12/29/16 181 lb 9.6 oz (82.4 kg)  12/11/16 180 lb 8 oz (81.9 kg)     Lab Results  Component Value Date   WBC 7.5 11/11/2016   HGB 13.2 11/11/2016   HCT 38.9 11/11/2016   PLT 227 11/11/2016   GLUCOSE 105 (H) 04/02/2017   CHOL 159 04/02/2017   TRIG 74.0 04/02/2017   HDL 68.90 04/02/2017    LDLCALC 75 04/02/2017   ALT 26 04/02/2017   AST 30 04/02/2017   NA 143 04/02/2017   K 4.5 04/02/2017   CL 103 04/02/2017   CREATININE 1.08 04/02/2017   BUN 16 04/02/2017   CO2 33 (H) 04/02/2017   TSH 1.60 06/26/2016   INR 0.99 03/25/2016   HGBA1C 6.3 04/02/2017   MICROALBUR <0.7 10/24/2016    Dg Chest 2 View  Result Date: 11/11/2016 CLINICAL DATA:  Chest pressure today. EXAM: CHEST  2 VIEW COMPARISON:  Radiograph 03/15/2016 FINDINGS: The cardiomediastinal contours are unchanged with atherosclerosis of the thoracic aorta. Heart size is upper normal. There is a hiatal hernia. No pulmonary edema. Subsegmental atelectasis versus scarring in the left lower lung zone. No consolidation, pleural effusion, or pneumothorax. No acute osseous abnormalities are seen. IMPRESSION: 1. No acute abnormality. Subsegmental atelectasis or scarring in the left lung. 2. Borderline cardiomegaly.  Atherosclerotic thoracic aorta. Electronically Signed   By: Jeb Levering M.D.   On: 11/11/2016 17:18       Assessment & Plan:   Problem List Items Addressed This Visit    BMI 37.0-37.9, adult    Discussed diet and exercise.  Follow.       Coronary artery disease involving native coronary artery of native heart with unstable angina pectoris [facility])    Seeing cardiology.  No chest pain.  Continue risk factor modification.        Diabetes mellitus ([place])    Low carb diet and exercise.  Follow met b and a1c.        Relevant Orders   Hemoglobin A1c   Essential hypertension, benign (Chronic)    Blood pressure under good control.  Continue same medication regimen.  Follow pressures.  Follow metabolic panel.        Relevant Orders   TSH   Basic metabolic panel   GERD (gastroesophageal reflux disease)    Controlled on current regimen.  Hypercholesterolemia (Chronic)    On crestor.  Low cholesterol diet and exercise.  Follow lipid panel and liver function tests.        Relevant Orders   Lipid  panel   Hepatic function panel   Obstructive sleep apnea (Chronic)    CPAP.       Paroxysmal atrial fibrillation (HCC)    In SR.  Followed by cardiology.  On eliquis.        Stress    Increased stress as outlined.  Discussed with her today.  Does not feel needs any further intervention.  Follow.            Einar Pheasant, MD

## 2017-04-08 ENCOUNTER — Encounter: Payer: Self-pay | Admitting: Internal Medicine

## 2017-04-08 NOTE — Assessment & Plan Note (Signed)
Increased stress as outlined.  Discussed with her today.  Does not feel needs any further intervention.  Follow.   

## 2017-04-08 NOTE — Assessment & Plan Note (Signed)
Seeing cardiology.  No chest pain.  Continue risk factor modification.

## 2017-04-08 NOTE — Assessment & Plan Note (Signed)
Blood pressure under good control.  Continue same medication regimen.  Follow pressures.  Follow metabolic panel.   

## 2017-04-08 NOTE — Assessment & Plan Note (Signed)
In SR.  Followed by cardiology.  On eliquis.

## 2017-04-08 NOTE — Assessment & Plan Note (Signed)
Controlled on current regimen.   

## 2017-04-08 NOTE — Assessment & Plan Note (Signed)
Low carb diet and exercise.  Follow met b and a1c.   

## 2017-04-08 NOTE — Assessment & Plan Note (Signed)
On crestor.  Low cholesterol diet and exercise.  Follow lipid panel and liver function tests.   

## 2017-04-08 NOTE — Assessment & Plan Note (Signed)
Discussed diet and exercise.  Follow.  

## 2017-04-08 NOTE — Assessment & Plan Note (Signed)
CPAP.  

## 2017-04-13 DIAGNOSIS — R498 Other voice and resonance disorders: Secondary | ICD-10-CM | POA: Diagnosis not present

## 2017-04-18 ENCOUNTER — Other Ambulatory Visit: Payer: Self-pay | Admitting: Neurology

## 2017-04-18 DIAGNOSIS — H02833 Dermatochalasis of right eye, unspecified eyelid: Secondary | ICD-10-CM | POA: Diagnosis not present

## 2017-04-18 DIAGNOSIS — R498 Other voice and resonance disorders: Secondary | ICD-10-CM

## 2017-04-23 ENCOUNTER — Ambulatory Visit: Payer: Medicare Other

## 2017-04-26 ENCOUNTER — Ambulatory Visit
Admission: RE | Admit: 2017-04-26 | Discharge: 2017-04-26 | Disposition: A | Discharge status: Home / Self Care | Payer: Medicare Other | Source: Ambulatory Visit | Attending: Neurology | Admitting: Neurology

## 2017-04-26 DIAGNOSIS — R498 Other voice and resonance disorders: Secondary | ICD-10-CM

## 2017-04-26 DIAGNOSIS — I6782 Cerebral ischemia: Secondary | ICD-10-CM | POA: Diagnosis not present

## 2017-04-26 DIAGNOSIS — R251 Tremor, unspecified: Secondary | ICD-10-CM | POA: Diagnosis not present

## 2017-04-26 DIAGNOSIS — G319 Degenerative disease of nervous system, unspecified: Secondary | ICD-10-CM | POA: Diagnosis not present

## 2017-05-08 ENCOUNTER — Encounter: Payer: Self-pay | Admitting: Internal Medicine

## 2017-05-08 ENCOUNTER — Ambulatory Visit (INDEPENDENT_AMBULATORY_CARE_PROVIDER_SITE_OTHER): Payer: Medicare Other | Admitting: Internal Medicine

## 2017-05-08 VITALS — BP 130/72 | HR 61 | Ht 59.0 in | Wt 190.0 lb

## 2017-05-08 DIAGNOSIS — J683 Other acute and subacute respiratory conditions due to chemicals, gases, fumes and vapors: Secondary | ICD-10-CM

## 2017-05-08 MED ORDER — ALBUTEROL SULFATE HFA 108 (90 BASE) MCG/ACT IN AERS
2.0000 | INHALATION_SPRAY | Freq: Four times a day (QID) | RESPIRATORY_TRACT | 6 refills | Status: DC | PRN
Start: 2017-05-08 — End: 2018-04-22

## 2017-05-08 NOTE — Progress Notes (Signed)
MRN# [id number] KALLIE DEPOLO August 13, 1940   CC: Follow up OSA and SOB  Brief History: HPI 06/2014  ROV 07/2014 Plan - cont with cpap, severe hiatal hernia, referral to surgery for large hiatal hernia, cont with exercise as tolerated. Has normal pfts (no obs process).   ROV 10/2014: Plan: Normal PFTS, 23mwt with only 372 feet. Most likely deconditioning.  ROV 04/2015 Plan  - continue with CPAP - exercise as tolerated.  - ENT F\U - Cardiology Referral - Breo 2 week trial - patient to call office for rx if needed.   OV 05/08/17 Patient resents today for follow-up of her chronic dyspnea on exertion, and chronic nonproductive cough Improved with BREO but sometimes wheezing with exertion Have explained using ALBUTEROL as needed  .Patient is on Breo, and states that she feels that this is definitely helping her shortness of breath with exertion and cough. She is also seeing cardiology,Dr. Rockey Situ, +coronary artery disease   Follow up OSA CPAP 11 cm h20 with AHI 2.6 97% compliance .   Overall doing well with CPAP therapy and breathing No signs of infection at this time She uses and benefits from CPAP therapy and doing well  Medication:    Review of Systems: Gen:  Denies  fever, sweats, chills HEENT: Denies blurred vision, double vision, ear pain, eye pain, hearing loss, nose bleeds, sore throat Cvc:  No dizziness, mild intermittent sharp left chest pain at rest.  Resp:   Admits to:sob, doe, dry cough,  (baseline) Gi: Denies swallowing difficulty, stomach pain, nausea or vomiting, diarrhea, constipation, bowel incontinence Gu:  Denies bladder incontinence, burning urine Ext:   Leg swelling Skin: No skin rash, easy bruising or bleeding or hives Endoc:  No polyuria, polydipsia , polyphagia or weight change Other:  All other systems negative  Allergies:  Penicillins and Sulfa antibiotics  Physical Examination:  VS: BP 130/72 (BP Location: Left Arm, Cuff Size: Normal)    Pulse 61   Ht 4\' 11"  (1.499 m)   Wt 190 lb (86.2 kg)   LMP 05/01/1972   SpO2 99%   BMI 38.38 kg/m   General Appearance: No distress  HEENT: PERRLA, no ptosis, no other lesions noticed Pulmonary:normal breath sounds., diaphragmatic excursion normal.No wheezing, No rales   Cardiovascular:  Normal S1,S2.  No m/r/g.     Abdomen:Exam: Benign, Soft, non-tender, No masses  Skin:   warm, no rashes, no ecchymosis  Extremities: normal, no cyanosis, clubbing, warm with normal capillary refill.  1+ bilateral lower extremity edema pitting  Assessment and Plan:   77 year old female seen in follow-up for dyspnea on exertion/SOB with CAD and deconditioned state with OSA  DOE (dyspnea on exertion)/SOB-likely related to underlying reactive airways disease and deconditioned state I believe that her dyspnea on exertion is multifactorial in nature due to factors such as: Deconditioning, obstructive sleep apnea, obesity, hiatal hernia, coronary artery disease, cardiac megaly, nasal congestion/allergies She now with improvement since being on Breo and adjusting her PPI  Will ADD albuterol as needed and I have advised to rinse mouth after every use of BREO  Deconditioned state-recommend exercise as tolerated  CAD-needs to follow up with cardiology  Nose bleeds/pink tingeg sputum -related to rhinnits in setting of plavix and oral anticoagulation herapy  OSA with CPAP therapy AHI now 2.6    Patient satisfied with Plan of action and management. All questions answered Follow up 6 months   Ece [place] Patricia Pesa, M.D.  [facility] Pulmonary & Critical Care Medicine  Medical Director [facility]  [facility] Cardio-Pulmonary Department

## 2017-05-08 NOTE — Patient Instructions (Signed)
continue BREO Will add albuterol as needed

## 2017-05-11 ENCOUNTER — Other Ambulatory Visit: Payer: Self-pay

## 2017-05-11 ENCOUNTER — Encounter: Payer: Self-pay | Admitting: Speech Pathology

## 2017-05-11 ENCOUNTER — Ambulatory Visit: Payer: Medicare Other | Attending: Neurology | Admitting: Speech Pathology

## 2017-05-11 DIAGNOSIS — R490 Dysphonia: Secondary | ICD-10-CM | POA: Insufficient documentation

## 2017-05-11 NOTE — Therapy (Signed)
[place] MAIN [facility] SERVICES [address] [place], [place], [address] Phone: [phone]   Fax:  [phone]  Speech Language Pathology Evaluation  Patient Details  Name: April [name] MRN: [id number] Date of Birth: 06-Sep-1940 Referring Provider: Dr. Manuella Ghazi   Encounter Date: 05/11/2017  End of Session - 05/11/17 1640    Visit Number  1    Number of Visits  17    Date for SLP Re-Evaluation  07/09/17    SLP Start Time  1500    SLP Stop Time   1550    SLP Time Calculation (min)  50 min    Activity Tolerance  Patient tolerated treatment well       Past Medical History:  Diagnosis Date   Arrhythmia    history of an irregular heart beat   CAD S/P PCI pRCA Promus DES 3.5 x 16 (4.1 mm), ostRPDA Promus DES 2.5 x 12 (2.7 mm) 03/25/2016   Diverticulitis    Endometriosis    Heart murmur    no significant valvular lesion noted on Echo   Hiatal hernia    History of colon polyps 08/1994   Hypercholesterolemia    Hypertension    Migraines    history of migraines   Osteoporosis    Rheumatic fever    Sleep apnea    CPAP   ST elevation myocardial infarction (STEMI) of inferolateral wall, initial episode of care ([place]) 03/25/2016   Urine incontinence     Past Surgical History:  Procedure Laterality Date   ABDOMINAL HYSTERECTOMY     ovaries not removed   APPENDECTOMY     was removed during hysterectomy   Breast biopsies     x2   BREAST BIOPSY Bilateral "years ago"   BREAST SURGERY Bilateral    CARDIAC CATHETERIZATION  2011   moderate 40% RCA disease   CARDIAC CATHETERIZATION  01/2010   Dr. Gollan@[facility] : Only noted 40% RCA   CARDIAC CATHETERIZATION N/A 03/25/2016   Procedure: Left Heart Cath and Coronary Angiography;  Surgeon: 04/07/2016, MD;  Location: [place] CV LAB;  Service: Cardiovascular;  Laterality: N/A;   CARDIAC CATHETERIZATION N/A 03/25/2016   Procedure: Coronary Stent Intervention;  Surgeon:  04/07/2016, MD;  Location: [place] [facility];  Service: Cardiovascular;  Laterality: N/A;   COLONOSCOPY  2013   ESOPHAGOGASTRODUODENOSCOPY (EGD) WITH PROPOFOL N/A 03/17/2015   Procedure: ESOPHAGOGASTRODUODENOSCOPY (EGD) WITH PROPOFOL;  Surgeon: 03/30/2015, MD;  Location: [facility] ENDOSCOPY;  Service: Gastroenterology;  Laterality: N/A;   NM MYOVIEW ([place] HX)  07/2015   No evidence ischemia or infarction. EF 66%. Low risk   TONSILLECTOMY     TRANSTHORACIC ECHOCARDIOGRAM  10/2013   Normal LV size and function. EF 55-65%. GR 1 DD. Otherwise normal.    There were no vitals filed for this visit.      SLP Evaluation [facility] - 05/11/17 0001      SLP Visit Information   SLP Received On  05/11/17    Referring Provider  Dr. 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 77 for voice therapy.  Prior Functional Status   Cognitive/Linguistic Baseline  Within functional limits      Oral Motor/Sensory Function   Overall Oral Motor/Sensory Function  Appears within functional limits for tasks assessed      Motor Speech   Overall Motor Speech  Impaired    Respiration  Impaired    Level of Impairment  Conversation    Phonation  Hoarse Tremor    Resonance  Within functional limits    Articulation  Within functional limitis    Intelligibility  Intelligible    Phonation  Impaired    Vocal Abuses  Habitual Cough/Throat Clear;Habitual Hyperphonia    Tension Present  Jaw;Neck;Shoulder    Volume  Appropriate    Pitch  Low      Standardized Assessments   Standardized Assessments   Other Assessment Perceptual Voice Evaluation       Perceptual Voice Evaluation Voice checklist:  Health risks: GERD, allergies   Characteristic voice use:  patient states she talks and sings a lot   Environmental risks: no significant environmental risks  Misuse: excessive talking/singing  Abuse: coughing/throat clearing  Vocal characteristics: vocal tremor, breathy, hoarse, glottal fry, low habitual pitch, limited voice range, poor vocal projection, excessive pharyngeal resonance Maximum phonation time for sustained ah: 14 seconds Average fundamental frequency during sustained ah: 98 Hz (5.4 STD below average for age and gender) Highest dynamic pitch when altering pitch from a low note to a high note: 350 Hz Lowest dynamic pitch when altering from a high note to a low note: 230 Hz Average time patient was able to sustain /s/: 5.7 seconds Average time patient was able to sustain /z/: 5.3 seconds s/z ratio : 0.9 Visi-Pitch: Multi-Dimensional Voice Program (MDVP)  MDVP extracts objective quantitative values (Relative Average Perturbation, Shimmer, Voice Turbulence Index, and Noise to Harmonic Ratio) on sustained phonation, which are displayed graphically and numerically in comparison to a built-in normative database.  The patient exhibited values outside the norm for Relative Average Perturbation.  Average fundamental frequency was 5.4 STD below average for age and gender. Perceptually, her voice was muffled. The patient improved all parameters with a louder, higher pitched voice. Education: Patient instructed in extrinsic laryngeal muscle stretches, breath support exercises, resonant voice exercise  SLP Education - 05/11/17 1639    Education provided  Yes    Education Details  plan of care    Person(s) Educated  Patient    Methods  Explanation    Comprehension  Verbalized understanding         SLP Long Term Goals - 05/11/17 1642      SLP LONG TERM GOAL #1   Title  The patient will demonstrate independent understanding of vocal hygiene concepts and extrinsic laryngeal muscle stretches.      Time  8    Period  Weeks    Status  New     Target Date  07/09/17      SLP LONG TERM GOAL #2   Title  The patient will be independent for abdominal breathing and breath support exercises.    Time  8    Period  Weeks    Status  New    Target Date  07/09/17      SLP LONG TERM GOAL #3   Title  The patient will minimize vocal tension via resonant voice therapy (or comparable technique) with min SLP cues with 80% accuracy.    Time  8    Period  Weeks    Status  New  Target Date  07/09/17      SLP LONG TERM GOAL #4   Title  The patient will maintain relaxed phonation / oral resonance and minimize tremor identification for paragraph length recitation with 80% accuracy.    Time  8    Period  Weeks    Status  New    Target Date  07/09/17       Plan - 05/11/17 1641    Clinical Impression Statement  This 21 year woman under the care of Dr. Manuella Ghazi, with vocal tremor, is presenting with moderate dysphonia characterized by vocal tremor, breathy, hoarse, glottal fry, low habitual pitch, limited voice range, poor vocal projection, excessive pharyngeal resonance, and intrinsic/extrinsic laryngeal tension.  The patient will benefit from voice therapy to improve breath control for speech, reduce laryngeal tension, and promote relaxed phonation / oral resonance.  Voice therapy cannot eliminate the tremor but may improve overall voice production.    Speech Therapy Frequency  2x / week    Duration  Other (comment) 8 weeks    Treatment/Interventions  Patient/family education;Other (comment) Voice therapy    Potential to Achieve Goals  Good    Potential Considerations  Ability to learn/carryover information;Severity of impairments;Co-morbidities;Cooperation/participation level;Family/community support;Medical prognosis;Pain level;Previous level of function    SLP Home Exercise Plan  Breath support exercises, tongue and throat stretches, resonant voice exercise    Consulted and Agree with Plan of Care  Patient       Patient will benefit from  skilled therapeutic intervention in order to improve the following deficits and impairments:   Dysphonia - Plan: SLP plan of care cert/re-cert    Problem List Patient Active Problem List   Diagnosis Date Noted   Paroxysmal atrial fibrillation ([place]) 03/27/2016   ST elevation myocardial infarction (STEMI) of inferolateral wall, initial episode of care ([place]) 03/25/2016   CAD S/P PCI pRCA Promus DES 3.5 x 16 (4.1 mm), ostRPDA Promus DES 2.5 x 12 (2.7 mm) 03/25/2016   Right knee pain 11/13/2015   Coronary artery disease involving native coronary artery of native heart with unstable angina pectoris ([place]) 06/01/2015   Diabetes mellitus ([place]) 05/16/2015   Gastritis 04/01/2015   Chest pain 04/01/2015   Hiatal hernia 08/25/2014   DOE (dyspnea on exertion) 07/15/2014   Congestion of throat 07/06/2014   Health care maintenance 07/06/2014   Fibrocystic breast disease 07/06/2014   BMI 37.0-37.9, adult 04/19/2014   Stress 02/16/2013   Rib pain on right side 02/16/2013   SOB (shortness of breath) 10/13/2012   GERD (gastroesophageal reflux disease) 10/13/2012   Essential hypertension, benign 08/18/2012   Hypercholesterolemia 08/18/2012   History of colonic polyps 08/18/2012   Obstructive sleep apnea 08/18/2012   Osteoporosis 08/18/2012   Leroy Sea, MS/CCC- SLP  Lou Miner 05/11/2017, 4:47 PM  [place] MAIN [facility] SERVICES [address] [place], [place], [address] Phone: [phone]   Fax:  [phone]  Name: April [name] MRN: [id number] Date of Birth: 18-Apr-1941

## 2017-05-17 ENCOUNTER — Other Ambulatory Visit: Payer: Self-pay | Admitting: Internal Medicine

## 2017-05-23 DIAGNOSIS — X32XXXA Exposure to sunlight, initial encounter: Secondary | ICD-10-CM | POA: Diagnosis not present

## 2017-05-23 DIAGNOSIS — D225 Melanocytic nevi of trunk: Secondary | ICD-10-CM | POA: Diagnosis not present

## 2017-05-23 DIAGNOSIS — L570 Actinic keratosis: Secondary | ICD-10-CM | POA: Diagnosis not present

## 2017-05-23 DIAGNOSIS — D2272 Melanocytic nevi of left lower limb, including hip: Secondary | ICD-10-CM | POA: Diagnosis not present

## 2017-05-23 DIAGNOSIS — D3701 Neoplasm of uncertain behavior of lip: Secondary | ICD-10-CM | POA: Diagnosis not present

## 2017-05-23 DIAGNOSIS — L821 Other seborrheic keratosis: Secondary | ICD-10-CM | POA: Diagnosis not present

## 2017-05-23 DIAGNOSIS — D2262 Melanocytic nevi of left upper limb, including shoulder: Secondary | ICD-10-CM | POA: Diagnosis not present

## 2017-05-25 ENCOUNTER — Encounter: Payer: Self-pay | Admitting: Speech Pathology

## 2017-05-25 ENCOUNTER — Ambulatory Visit: Payer: Medicare Other | Admitting: Speech Pathology

## 2017-05-25 DIAGNOSIS — R490 Dysphonia: Secondary | ICD-10-CM | POA: Diagnosis not present

## 2017-05-25 NOTE — Therapy (Signed)
[place] MAIN [facility] SERVICES [address] [place], [place], [address] Phone: [phone]   Fax:  [phone]  Speech Language Pathology Treatment  Patient Details  Name: April [name] MRN: [id number] Date of Birth: 1940-09-05 Referring Provider: Dr. Manuella Ghazi   Encounter Date: 05/25/2017  End of Session - 05/25/17 1454    Visit Number  2    Number of Visits  17    Date for SLP Re-Evaluation  07/09/17    SLP Start Time  1400    SLP Stop Time   1455    SLP Time Calculation (min)  55 min    Activity Tolerance  Patient tolerated treatment well       Past Medical History:  Diagnosis Date  . Arrhythmia    history of an irregular heart beat  . CAD S/P PCI pRCA Promus DES 3.5 x 16 (4.1 mm), ostRPDA Promus DES 2.5 x 12 (2.7 mm) 03/25/2016  . Diverticulitis   . Endometriosis   . Heart murmur    no significant valvular lesion noted on Echo  . Hiatal hernia   . History of colon polyps 08/1994  . Hypercholesterolemia   . Hypertension   . Migraines    history of migraines  . Osteoporosis   . Rheumatic fever   . Sleep apnea    CPAP  . ST elevation myocardial infarction (STEMI) of inferolateral wall, initial episode of care ([place]) 03/25/2016  . Urine incontinence     Past Surgical History:  Procedure Laterality Date  . ABDOMINAL HYSTERECTOMY     ovaries not removed  . APPENDECTOMY     was removed during hysterectomy  . Breast biopsies     x2  . BREAST BIOPSY Bilateral "years ago"  . BREAST SURGERY Bilateral   . CARDIAC CATHETERIZATION  2011   moderate 40% RCA disease  . CARDIAC CATHETERIZATION  01/2010   Dr. Gollan@[facility] : Only noted 40% RCA  . CARDIAC CATHETERIZATION N/A 03/25/2016   Procedure: Left Heart Cath and Coronary Angiography;  Surgeon: 04/07/2016, MD;  Location: [place] CV LAB;  Service: Cardiovascular;  Laterality: N/A;  . CARDIAC CATHETERIZATION N/A 03/25/2016   Procedure: Coronary Stent Intervention;  Surgeon:  04/07/2016, MD;  Location: [place] CV LAB;  Service: Cardiovascular;  Laterality: N/A;  . COLONOSCOPY  2013  . ESOPHAGOGASTRODUODENOSCOPY (EGD) WITH PROPOFOL N/A 03/17/2015   Procedure: ESOPHAGOGASTRODUODENOSCOPY (EGD) WITH PROPOFOL;  Surgeon: 03/30/2015, MD;  Location: [facility] ENDOSCOPY;  Service: Gastroenterology;  Laterality: N/A;  . NM MYOVIEW ([place] HX)  07/2015   No evidence ischemia or infarction. EF 66%. Low risk  . TONSILLECTOMY    . TRANSTHORACIC ECHOCARDIOGRAM  10/2013   Normal LV size and function. EF 55-65%. GR 1 DD. Otherwise normal.    There were no vitals filed for this visit.  Subjective Assessment - 05/25/17 1454    Subjective  Patient reports increased productive coughing last week, but better this week            ADULT SLP TREATMENT - 05/25/17 0001      General Information   Behavior/Cognition  Alert;Cooperative;Pleasant mood    HPI   77 year old woman, with vocal tremor, referred by Dr. 79 for voice therapy.         Treatment Provided   Treatment provided  Cognitive-Linquistic      Pain Assessment   Pain Assessment  No/denies pain      Cognitive-Linquistic Treatment  Treatment focused on  Voice    Skilled Treatment  The patient was provided with written and verbal teaching regarding vocal hygiene (reduce throat clearing).  The patient was provided with written and verbal teaching regarding tongue and throat stretches exercises to promote relaxed phonation. The patient was provided with written and verbal teaching regarding breathing strategies (paused breathing, belly breathing, straw breathing).  Patient instructed in resonant voice technique (tongue-out hum).  Patient able to maintain clear, forward voice with hum; maintain clear voice with hummed contours with 70% accuracy; and maintain clear voice with hummed pitch glide with 20% accuracy.      Assessment / Recommendations / Plan   Plan  Continue with current plan of care       Progression Toward Goals   Progression toward goals  Progressing toward goals       SLP Education - 05/25/17 1454    Education provided  Yes    Education Details  breathing strategies    Person(s) Educated  Patient    Methods  Explanation    Comprehension  Verbalized understanding         SLP Long Term Goals - 05/11/17 1642      SLP LONG TERM GOAL #1   Title  The patient will demonstrate independent understanding of vocal hygiene concepts and extrinsic laryngeal muscle stretches.      Time  8    Period  Weeks    Status  New    Target Date  07/09/17      SLP LONG TERM GOAL #2   Title  The patient will be independent for abdominal breathing and breath support exercises.    Time  8    Period  Weeks    Status  New    Target Date  07/09/17      SLP LONG TERM GOAL #3   Title  The patient will minimize vocal tension via resonant voice therapy (or comparable technique) with min SLP cues with 80% accuracy.    Time  8    Period  Weeks    Status  New    Target Date  07/09/17      SLP LONG TERM GOAL #4   Title  The patient will maintain relaxed phonation / oral resonance and minimize tremor identification for paragraph length recitation with 80% accuracy.    Time  8    Period  Weeks    Status  New    Target Date  07/09/17       Plan - 05/25/17 1455    Clinical Impression Statement  Patient able to improve vocal quality with nasality to improve oral resonance and semi-occluded vocal tract to decrease laryngeal strain.    Speech Therapy Frequency  2x / week    Duration  Other (comment)    Treatment/Interventions  Patient/family education;Other (comment) Voice therapy    Potential to Achieve Goals  Good    Potential Considerations  Ability to learn/carryover information;Severity of impairments;Co-morbidities;Cooperation/participation level;Family/community support;Medical prognosis;Pain level;Previous level of function    SLP Home Exercise Plan  breathing strategies, tongue  and throat stretches, resonant voice exercise    Consulted and Agree with Plan of Care  Patient       Patient will benefit from skilled therapeutic intervention in order to improve the following deficits and impairments:   Dysphonia    Problem List Patient Active Problem List   Diagnosis Date Noted  . Paroxysmal atrial fibrillation ([place]) 03/27/2016  . ST elevation myocardial  infarction (STEMI) of inferolateral wall, initial episode of care ([place]) 03/25/2016  . CAD S/P PCI pRCA Promus DES 3.5 x 16 (4.1 mm), ostRPDA Promus DES 2.5 x 12 (2.7 mm) 03/25/2016  . Right knee pain 11/13/2015  . Coronary artery disease involving native coronary artery of native heart with unstable angina pectoris ([place]) 06/01/2015  . Diabetes mellitus ([place]) 05/16/2015  . Gastritis 04/01/2015  . Chest pain 04/01/2015  . Hiatal hernia 08/25/2014  . DOE (dyspnea on exertion) 07/15/2014  . Congestion of throat 07/06/2014  . Health care maintenance 07/06/2014  . Fibrocystic breast disease 07/06/2014  . BMI 37.0-37.9, adult 04/19/2014  . Stress 02/16/2013  . Rib pain on right side 02/16/2013  . SOB (shortness of breath) 10/13/2012  . GERD (gastroesophageal reflux disease) 10/13/2012  . Essential hypertension, benign 08/18/2012  . Hypercholesterolemia 08/18/2012  . History of colonic polyps 08/18/2012  . Obstructive sleep apnea 08/18/2012  . Osteoporosis 08/18/2012   Leroy Sea, MS/CCC- SLP  Lou Miner 05/25/2017, 2:56 PM  [place] MAIN [facility] SERVICES [address] [place], [place], [address] Phone: [phone]   Fax:  [phone]   Name: April [name] MRN: [id number] Date of Birth: [birth year]

## 2017-05-28 ENCOUNTER — Other Ambulatory Visit: Payer: Self-pay | Admitting: Internal Medicine

## 2017-06-01 ENCOUNTER — Encounter: Payer: Self-pay | Admitting: Speech Pathology

## 2017-06-01 ENCOUNTER — Ambulatory Visit: Payer: Medicare Other | Attending: Neurology | Admitting: Speech Pathology

## 2017-06-01 DIAGNOSIS — R490 Dysphonia: Secondary | ICD-10-CM

## 2017-06-01 NOTE — Therapy (Signed)
[place] MAIN [facility] SERVICES [address] [place], [place], [address] Phone: [phone]   Fax:  [phone]  Speech Language Pathology Treatment  Patient Details  Name: April [name] MRN: [id number] Date of Birth: 1940-07-26 Referring Provider: Dr. Manuella Ghazi   Encounter Date: 06/01/2017  End of Session - 06/01/17 1546    Visit Number  3    Number of Visits  17    Date for SLP Re-Evaluation  07/09/17    SLP Start Time  11    SLP Stop Time   1500    SLP Time Calculation (min)  60 min    Activity Tolerance  Patient tolerated treatment well       Past Medical History:  Diagnosis Date  . Arrhythmia    history of an irregular heart beat  . CAD S/P PCI pRCA Promus DES 3.5 x 16 (4.1 mm), ostRPDA Promus DES 2.5 x 12 (2.7 mm) 03/25/2016  . Diverticulitis   . Endometriosis   . Heart murmur    no significant valvular lesion noted on Echo  . Hiatal hernia   . History of colon polyps 08/1994  . Hypercholesterolemia   . Hypertension   . Migraines    history of migraines  . Osteoporosis   . Rheumatic fever   . Sleep apnea    CPAP  . ST elevation myocardial infarction (STEMI) of inferolateral wall, initial episode of care ([place]) 03/25/2016  . Urine incontinence     Past Surgical History:  Procedure Laterality Date  . ABDOMINAL HYSTERECTOMY     ovaries not removed  . APPENDECTOMY     was removed during hysterectomy  . Breast biopsies     x2  . BREAST BIOPSY Bilateral "years ago"  . BREAST SURGERY Bilateral   . CARDIAC CATHETERIZATION  2011   moderate 40% RCA disease  . CARDIAC CATHETERIZATION  01/2010   Dr. [email or web address] : Only noted 40% RCA  . CARDIAC CATHETERIZATION N/A 03/25/2016   Procedure: Left Heart Cath and Coronary Angiography;  Surgeon: 04/07/2016, MD;  Location: [place] [facility];  Service: Cardiovascular;  Laterality: N/A;  . CARDIAC CATHETERIZATION N/A 03/25/2016   Procedure: Coronary Stent Intervention;  Surgeon:  04/07/2016, MD;  Location: [place] [facility];  Service: Cardiovascular;  Laterality: N/A;  . COLONOSCOPY  2013  . ESOPHAGOGASTRODUODENOSCOPY (EGD) WITH PROPOFOL N/A 03/17/2015   Procedure: ESOPHAGOGASTRODUODENOSCOPY (EGD) WITH PROPOFOL;  Surgeon: 03/30/2015, MD;  Location: [facility] ENDOSCOPY;  Service: Gastroenterology;  Laterality: N/A;  . NM MYOVIEW ([place] HX)  07/2015   No evidence ischemia or infarction. EF 66%. Low risk  . TONSILLECTOMY    . TRANSTHORACIC ECHOCARDIOGRAM  10/2013   Normal LV size and function. EF 55-65%. GR 1 DD. Otherwise normal.    There were no vitals filed for this visit.  Subjective Assessment - 06/01/17 1543    Subjective  Patient identifies breathing, talking, and laughing as cough triggers            ADULT SLP TREATMENT - 06/01/17 0001      General Information   Behavior/Cognition  Alert;Cooperative;Pleasant mood    HPI   77 year old woman, with vocal tremor, referred by Dr. 79 for voice therapy.         Treatment Provided   Treatment provided  Cognitive-Linquistic      Pain Assessment   Pain Assessment  No/denies pain      Cognitive-Linquistic Treatment   Treatment focused  on  Voice    Skilled Treatment  The patient was provided with written and verbal teaching regarding vocal hygiene (reduce throat clearing).  The patient was provided with written and verbal teaching regarding tongue and throat stretches exercises to promote relaxed phonation. The patient was provided with written and verbal teaching regarding breathing strategies (paused breathing, belly breathing, straw breathing).  The patient identifies cough triggers as breathing, talking, and laughing.  The patient uses a habitual low pitch, resonant techniques used with a higher pitch.  Patient instructed in resonant voice technique (hum, hummed "n").  Patient able to maintain clear, forward voice with hum and initial /n/ syllables.      Assessment / Recommendations / Plan    Plan  Continue with current plan of care      Progression Toward Goals   Progression toward goals  Progressing toward goals       SLP Education - 06/01/17 1544    Education provided  Yes    Education Details  breathing strategies, resonant voice     Person(s) Educated  Patient    Methods  Explanation;Demonstration;Verbal cues;Handout    Comprehension  Verbalized understanding         SLP Long Term Goals - 05/11/17 1642      SLP LONG TERM GOAL #1   Title  The patient will demonstrate independent understanding of vocal hygiene concepts and extrinsic laryngeal muscle stretches.      Time  8    Period  Weeks    Status  New    Target Date  07/09/17      SLP LONG TERM GOAL #2   Title  The patient will be independent for abdominal breathing and breath support exercises.    Time  8    Period  Weeks    Status  New    Target Date  07/09/17      SLP LONG TERM GOAL #3   Title  The patient will minimize vocal tension via resonant voice therapy (or comparable technique) with min SLP cues with 80% accuracy.    Time  8    Period  Weeks    Status  New    Target Date  07/09/17      SLP LONG TERM GOAL #4   Title  The patient will maintain relaxed phonation / oral resonance and minimize tremor identification for paragraph length recitation with 80% accuracy.    Time  8    Period  Weeks    Status  New    Target Date  07/09/17       Plan - 06/01/17 1546    Clinical Impression Statement  Patient able to improve vocal quality with nasality to improve oral resonance and semi-occluded vocal tract to decrease laryngeal strain.    Speech Therapy Frequency  2x / week    Duration  Other (comment)    Treatment/Interventions  Patient/family education;Other (comment) Voice therapy    Potential to Achieve Goals  Good    Potential Considerations  Ability to learn/carryover information;Severity of impairments;Co-morbidities;Cooperation/participation level;Family/community support;Medical  prognosis;Pain level;Previous level of function    SLP Home Exercise Plan  breathing strategies, tongue and throat stretches, resonant voice exercise    Consulted and Agree with Plan of Care  Patient       Patient will benefit from skilled therapeutic intervention in order to improve the following deficits and impairments:   Dysphonia    Problem List Patient Active Problem List   Diagnosis Date  Noted  . Paroxysmal atrial fibrillation ([place]) 03/27/2016  . ST elevation myocardial infarction (STEMI) of inferolateral wall, initial episode of care ([place]) 03/25/2016  . CAD S/P PCI pRCA Promus DES 3.5 x 16 (4.1 mm), ostRPDA Promus DES 2.5 x 12 (2.7 mm) 03/25/2016  . Right knee pain 11/13/2015  . Coronary artery disease involving native coronary artery of native heart with unstable angina pectoris ([place]) 06/01/2015  . Diabetes mellitus ([place]) 05/16/2015  . Gastritis 04/01/2015  . Chest pain 04/01/2015  . Hiatal hernia 08/25/2014  . DOE (dyspnea on exertion) 07/15/2014  . Congestion of throat 07/06/2014  . Health care maintenance 07/06/2014  . Fibrocystic breast disease 07/06/2014  . BMI 37.0-37.9, adult 04/19/2014  . Stress 02/16/2013  . Rib pain on right side 02/16/2013  . SOB (shortness of breath) 10/13/2012  . GERD (gastroesophageal reflux disease) 10/13/2012  . Essential hypertension, benign 08/18/2012  . Hypercholesterolemia 08/18/2012  . History of colonic polyps 08/18/2012  . Obstructive sleep apnea 08/18/2012  . Osteoporosis 08/18/2012   Leroy Sea, MS/CCC- SLP  Lou Miner 06/01/2017, 3:47 PM  [place] MAIN [facility] SERVICES [address] [place], [place], [address] Phone: [phone]   Fax:  [phone]   Name: April [name] MRN: [id number] Date of Birth: March 26, 1941

## 2017-06-04 ENCOUNTER — Ambulatory Visit: Payer: Medicare Other | Admitting: Speech Pathology

## 2017-06-05 ENCOUNTER — Ambulatory Visit: Payer: Medicare Other | Admitting: Speech Pathology

## 2017-06-05 ENCOUNTER — Encounter: Payer: Self-pay | Admitting: Speech Pathology

## 2017-06-05 DIAGNOSIS — R490 Dysphonia: Secondary | ICD-10-CM

## 2017-06-05 NOTE — Therapy (Signed)
[place] MAIN [facility] SERVICES [address] [place], [place], [address] Phone: [phone]   Fax:  [phone]  Speech Language Pathology Treatment  Patient Details  Name: April [name] MRN: [id number] Date of Birth: 20-Jun-1940 Referring Provider: Dr. Manuella Ghazi   Encounter Date: 06/05/2017  End of Session - 06/05/17 0959    Visit Number  4    Number of Visits  17    Date for SLP Re-Evaluation  07/09/17    SLP Start Time  0905    SLP Stop Time   0955    SLP Time Calculation (min)  50 min    Activity Tolerance  Patient tolerated treatment well       Past Medical History:  Diagnosis Date  . Arrhythmia    history of an irregular heart beat  . CAD S/P PCI pRCA Promus DES 3.5 x 16 (4.1 mm), ostRPDA Promus DES 2.5 x 12 (2.7 mm) 03/25/2016  . Diverticulitis   . Endometriosis   . Heart murmur    no significant valvular lesion noted on Echo  . Hiatal hernia   . History of colon polyps 08/1994  . Hypercholesterolemia   . Hypertension   . Migraines    history of migraines  . Osteoporosis   . Rheumatic fever   . Sleep apnea    CPAP  . ST elevation myocardial infarction (STEMI) of inferolateral wall, initial episode of care ([place]) 03/25/2016  . Urine incontinence     Past Surgical History:  Procedure Laterality Date  . ABDOMINAL HYSTERECTOMY     ovaries not removed  . APPENDECTOMY     was removed during hysterectomy  . Breast biopsies     x2  . BREAST BIOPSY Bilateral "years ago"  . BREAST SURGERY Bilateral   . CARDIAC CATHETERIZATION  2011   moderate 40% RCA disease  . CARDIAC CATHETERIZATION  01/2010   Dr. Gollan@[email or web address] : Only noted 40% RCA  . CARDIAC CATHETERIZATION N/A 03/25/2016   Procedure: Left Heart Cath and Coronary Angiography;  Surgeon: 04/07/2016, MD;  Location: [place] [facility];  Service: Cardiovascular;  Laterality: N/A;  . CARDIAC CATHETERIZATION N/A 03/25/2016   Procedure: Coronary Stent Intervention;  Surgeon:  04/07/2016, MD;  Location: [place] [facility];  Service: Cardiovascular;  Laterality: N/A;  . COLONOSCOPY  2013  . ESOPHAGOGASTRODUODENOSCOPY (EGD) WITH PROPOFOL N/A 03/17/2015   Procedure: ESOPHAGOGASTRODUODENOSCOPY (EGD) WITH PROPOFOL;  Surgeon: 03/30/2015, MD;  Location: [facility] ENDOSCOPY;  Service: Gastroenterology;  Laterality: N/A;  . NM MYOVIEW ([place] HX)  07/2015   No evidence ischemia or infarction. EF 66%. Low risk  . TONSILLECTOMY    . TRANSTHORACIC ECHOCARDIOGRAM  10/2013   Normal LV size and function. EF 55-65%. GR 1 DD. Otherwise normal.    There were no vitals filed for this visit.  Subjective Assessment - 06/05/17 0957    Subjective  Pt pleasant and cooperative with unfamiliar therapist    Currently in Pain?  No/denies            ADULT SLP TREATMENT - 06/05/17 0001      General Information   Behavior/Cognition  Alert;Cooperative;Pleasant mood    HPI   77 year old woman, with vocal tremor, referred by Dr. 79 for voice therapy.         Treatment Provided   Treatment provided  Cognitive-Linquistic      Pain Assessment   Pain Assessment  No/denies pain  Cognitive-Linquistic Treatment   Treatment focused on  Voice    Skilled Treatment  Pt was provided with written and verbal teaching regarding the following:  1. vocal hygiene - increasing water intake (carry water bottle at all times), breathe through the nose, reduce throat clearing, following strategies for management of esophageal dysmotility (specifically taking PPI 30-45 minutes prior to meals, rather than after meals)  2. tongue and throat stretches/exercises to promote relaxed phonation, increase awareness of openness/stretch/relaxation during yawn. Encouraged pt to take note of body tension throughout the day and use imagery to relax tension.  3. breathing strategies - paused breathing, belly breathing, straw breathing.  Diaphragmatic breathing. Pt exhibits jerky pursed lip breathing,  unable to make it more smooth. Encouraged blowing a cotton ball or toilet paper square across a table, working toward smooth extended exhalation.  4. Resonance - Patient instructed in resonant voice technique (hum, hummed "n").  Patient able to maintain clear, forward voice with hum and initial /m/ syllables. The patient uses a habitual low pitch during conversation. Higher pitch used with resonance techniques.    The patient identifies cough triggers as throat clearing, tension, and sinus congestion.  She reports she has increased her water intake, but could not identify how much she is currently drinking.      Assessment / Recommendations / Plan   Plan  Continue with current plan of care      Progression Toward Goals   Progression toward goals  Progressing toward goals       SLP Education - 06/05/17 0957    Education provided  Yes    Education Details  management of esophageal dysmotility/GERD, voice conservation, diaphragmatic breathing. Encouraged increased water intake, yawn - increase awareness of openness, increase awareness of tension    Person(s) Educated  Patient    Methods  Explanation;Demonstration;Verbal cues;Handout    Comprehension  Verbalized understanding;Need further instruction;Returned demonstration         SLP Long Term Goals - 06/05/17 1003      SLP LONG TERM GOAL #1   Title  The patient will demonstrate independent understanding of vocal hygiene concepts and extrinsic laryngeal muscle stretches.      Time  5    Period  Weeks    Status  On-going      SLP LONG TERM GOAL #2   Title  The patient will be independent for abdominal breathing and breath support exercises.    Time  5    Period  Weeks    Status  On-going      SLP LONG TERM GOAL #3   Title  The patient will minimize vocal tension via resonant voice therapy (or comparable technique) with min SLP cues with 80% accuracy.    Time  5    Period  Weeks    Status  On-going      SLP LONG TERM GOAL #4    Title  The patient will maintain relaxed phonation / oral resonance and minimize tremor identification for paragraph length recitation with 80% accuracy.    Time  5    Period  Weeks    Status  On-going       Plan - 06/05/17 0959    Clinical Impression Statement  Pt receptive to education and encouragement, and that benefit from voice therapy is making changes and completing exercises outside of treatment, which is difficult to do. Continued skilled ST intervention is recommended 2x/week focusing on goals for improved voice quality.    Speech  Therapy Frequency  2x / week    Duration  -- 8 weeks    Treatment/Interventions  Patient/family education;SLP instruction and feedback;Multimodal communcation approach;Functional tasks;Internal/external aids    Potential to Achieve Goals  Good    Potential Considerations  Ability to learn/carryover information;Severity of impairments;Co-morbidities;Cooperation/participation level;Family/community support;Medical prognosis;Pain level;Previous level of function    SLP Home Exercise Plan  continue breathing strategies, tongue and throat stretches, resonant voice exercise, increase water intake, be aware of tension, breathing    Consulted and Agree with Plan of Care  Patient       Patient will benefit from skilled therapeutic intervention in order to improve the following deficits and impairments:   Dysphonia    Problem List Patient Active Problem List   Diagnosis Date Noted  . Paroxysmal atrial fibrillation ([place]) 03/27/2016  . ST elevation myocardial infarction (STEMI) of inferolateral wall, initial episode of care ([place]) 03/25/2016  . CAD S/P PCI pRCA Promus DES 3.5 x 16 (4.1 mm), ostRPDA Promus DES 2.5 x 12 (2.7 mm) 03/25/2016  . Right knee pain 11/13/2015  . Coronary artery disease involving native coronary artery of native heart with unstable angina pectoris ([place]) 06/01/2015  . Diabetes mellitus ([place]) 05/16/2015  . Gastritis 04/01/2015  .  Chest pain 04/01/2015  . Hiatal hernia 08/25/2014  . DOE (dyspnea on exertion) 07/15/2014  . Congestion of throat 07/06/2014  . Health care maintenance 07/06/2014  . Fibrocystic breast disease 07/06/2014  . BMI 37.0-37.9, adult 04/19/2014  . Stress 02/16/2013  . Rib pain on right side 02/16/2013  . SOB (shortness of breath) 10/13/2012  . GERD (gastroesophageal reflux disease) 10/13/2012  . Essential hypertension, benign 08/18/2012  . Hypercholesterolemia 08/18/2012  . History of colonic polyps 08/18/2012  . Obstructive sleep apnea 08/18/2012  . Osteoporosis 08/18/2012   Celia B. Quentin Ore, [facility], CCC-SLP Speech Language Pathologist  Shonna Chock 06/05/2017, 10:07 AM  [place] MAIN [facility] SERVICES [address] [place], [place], [address] Phone: [phone]   Fax:  [phone]   Name: SHANTAL ROAN MRN: [id number] Date of Birth: 05-07-1940

## 2017-06-06 ENCOUNTER — Ambulatory Visit: Payer: Medicare Other | Admitting: Speech Pathology

## 2017-06-07 ENCOUNTER — Encounter: Payer: Self-pay | Admitting: Speech Pathology

## 2017-06-07 ENCOUNTER — Ambulatory Visit: Payer: Medicare Other | Admitting: Speech Pathology

## 2017-06-07 DIAGNOSIS — R490 Dysphonia: Secondary | ICD-10-CM

## 2017-06-07 NOTE — Therapy (Signed)
[place] MAIN [facility] SERVICES [address] [place], [place], [address] Phone: [phone]   Fax:  [phone]  Speech Language Pathology Treatment  Patient Details  Name: April [name] MRN: [id number] Date of Birth: 12-Aug-1940 Referring Provider: Dr. Manuella Ghazi   Encounter Date: 06/07/2017  End of Session - 06/07/17 1141    Visit Number  5    Number of Visits  17    Date for SLP Re-Evaluation  07/09/17    SLP Start Time  0900    SLP Stop Time   0950    SLP Time Calculation (min)  50 min    Activity Tolerance  Patient tolerated treatment well       Past Medical History:  Diagnosis Date  . Arrhythmia    history of an irregular heart beat  . CAD S/P PCI pRCA Promus DES 3.5 x 16 (4.1 mm), ostRPDA Promus DES 2.5 x 12 (2.7 mm) 03/25/2016  . Diverticulitis   . Endometriosis   . Heart murmur    no significant valvular lesion noted on Echo  . Hiatal hernia   . History of colon polyps 08/1994  . Hypercholesterolemia   . Hypertension   . Migraines    history of migraines  . Osteoporosis   . Rheumatic fever   . Sleep apnea    CPAP  . ST elevation myocardial infarction (STEMI) of inferolateral wall, initial episode of care ([place]) 03/25/2016  . Urine incontinence     Past Surgical History:  Procedure Laterality Date  . ABDOMINAL HYSTERECTOMY     ovaries not removed  . APPENDECTOMY     was removed during hysterectomy  . Breast biopsies     x2  . BREAST BIOPSY Bilateral "years ago"  . BREAST SURGERY Bilateral   . CARDIAC CATHETERIZATION  2011   moderate 40% RCA disease  . CARDIAC CATHETERIZATION  01/2010   Dr. Gollan@[facility] : Only noted 40% RCA  . CARDIAC CATHETERIZATION N/A 03/25/2016   Procedure: Left Heart Cath and Coronary Angiography;  Surgeon: 04/07/2016, MD;  Location: [place] [facility];  Service: Cardiovascular;  Laterality: N/A;  . CARDIAC CATHETERIZATION N/A 03/25/2016   Procedure: Coronary Stent Intervention;  Surgeon:  04/07/2016, MD;  Location: [place] CV [facility];  Service: Cardiovascular;  Laterality: N/A;  . COLONOSCOPY  2013  . ESOPHAGOGASTRODUODENOSCOPY (EGD) WITH PROPOFOL N/A 03/17/2015   Procedure: ESOPHAGOGASTRODUODENOSCOPY (EGD) WITH PROPOFOL;  Surgeon: 03/30/2015, MD;  Location: [facility] ENDOSCOPY;  Service: Gastroenterology;  Laterality: N/A;  . NM MYOVIEW ([place] HX)  07/2015   No evidence ischemia or infarction. EF 66%. Low risk  . TONSILLECTOMY    . TRANSTHORACIC ECHOCARDIOGRAM  10/2013   Normal LV size and function. EF 55-65%. GR 1 DD. Otherwise normal.    There were no vitals filed for this visit.  Subjective Assessment - 06/07/17 1139    Subjective  Pt brought water with her today. Voice seemed less tremulous today    Currently in Pain?  No/denies            ADULT SLP TREATMENT - 06/07/17 0001      General Information   Behavior/Cognition  Alert;Cooperative;Pleasant mood    HPI   77 year old woman, with vocal tremor, referred by Dr. 79 for voice therapy.         Treatment Provided   Treatment provided  Cognitive-Linquistic      Pain Assessment   Pain Assessment  No/denies pain  Cognitive-Linquistic Treatment   Treatment focused on  Voice    Skilled Treatment  The patient was provided with written and verbal teaching regarding  vocal hygiene: reduce throat clearing, increased water intake.   tongue and throat stretches/exercises: for relaxed phonation, including yawn/sigh and straw phonation.  breathing strategies: paused breathing, belly breathing, straw breathing.    The patient identifies cough triggers as breathing, talking, and laughing. Decreased voiced throat clearing was noted today. The patient uses a habitual low pitch, and was encouraged to raise pitch as able. She brought water today, and sipped on it throughout the session. She was encouraged to take note of tension level whenever she takes a sip of water, and use the opportunity to practice  the yawn sigh, decrease tension, and hydrate.   She reports she has not changed when she takes her reflux meds, and so was encouraged to move PPI to her bathroom where it will be more convenient to take it first thing in the morning, 30-45 minutes prior to other food or beverage.      Assessment / Recommendations / Plan   Plan  Continue with current plan of care      Progression Toward Goals   Progression toward goals  Progressing toward goals       SLP Education - 06/07/17 1140    Education provided  Yes    Education Details  yawn-sigh with /ah/, feel/look at soft palate elevation, long audible sigh    Person(s) Educated  Patient    Methods  Explanation;Demonstration;Verbal cues;Handout         SLP Long Term Goals - 06/07/17 1142      SLP LONG TERM GOAL #1   Title  The patient will demonstrate independent understanding of vocal hygiene concepts and extrinsic laryngeal muscle stretches.      Time  5    Period  Weeks    Status  On-going    Target Date  07/09/17      SLP LONG TERM GOAL #2   Title  The patient will be independent for abdominal breathing and breath support exercises.    Time  5    Period  Weeks    Status  On-going    Target Date  07/09/17      SLP LONG TERM GOAL #3   Title  The patient will minimize vocal tension via resonant voice therapy (or comparable technique) with min SLP cues with 80% accuracy.    Time  5    Period  Weeks    Status  On-going    Target Date  07/09/17      SLP LONG TERM GOAL #4   Title  The patient will maintain relaxed phonation / oral resonance and minimize tremor identification for paragraph length recitation with 80% accuracy.    Time  5    Period  Weeks    Status  On-going    Target Date  07/09/17       Plan - 06/07/17 1141    Clinical Impression Statement  Pt continues to work toward application of suggestions provided in treatment. Continued skilled ST intervention is recommended to maximize vocal health.    Speech  Therapy Frequency  2x / week    Duration  -- 8 weeks    Treatment/Interventions  Patient/family education;SLP instruction and feedback;Multimodal communcation approach;Functional tasks;Internal/external aids    Potential to Achieve Goals  Good    Potential Considerations  Ability to learn/carryover information;Severity of impairments;Co-morbidities;Cooperation/participation level;Family/community support;Medical prognosis;Pain  level;Previous level of function    SLP Home Exercise Plan  continue breathing strategies, tongue and throat stretches, resonant voice exercise, increase water intake, be aware of tension, breathing    Consulted and Agree with Plan of Care  Patient       Patient will benefit from skilled therapeutic intervention in order to improve the following deficits and impairments:   Dysphonia    Problem List Patient Active Problem List   Diagnosis Date Noted  . Paroxysmal atrial fibrillation ([place]) 03/27/2016  . ST elevation myocardial infarction (STEMI) of inferolateral wall, initial episode of care ([place]) 03/25/2016  . CAD S/P PCI pRCA Promus DES 3.5 x 16 (4.1 mm), ostRPDA Promus DES 2.5 x 12 (2.7 mm) 03/25/2016  . Right knee pain 11/13/2015  . Coronary artery disease involving native coronary artery of native heart with unstable angina pectoris ([place]) 06/01/2015  . Diabetes mellitus ([place]) 05/16/2015  . Gastritis 04/01/2015  . Chest pain 04/01/2015  . Hiatal hernia 08/25/2014  . DOE (dyspnea on exertion) 07/15/2014  . Congestion of throat 07/06/2014  . Health care maintenance 07/06/2014  . Fibrocystic breast disease 07/06/2014  . BMI 37.0-37.9, adult 04/19/2014  . Stress 02/16/2013  . Rib pain on right side 02/16/2013  . SOB (shortness of breath) 10/13/2012  . GERD (gastroesophageal reflux disease) 10/13/2012  . Essential hypertension, benign 08/18/2012  . Hypercholesterolemia 08/18/2012  . History of colonic polyps 08/18/2012  . Obstructive sleep apnea 08/18/2012   . Osteoporosis 08/18/2012   Gunda Maqueda B. Quentin Ore [facility], CCC-SLP Speech Language Pathologist  Shonna Chock 06/07/2017, 11:44 AM  [place] MAIN [facility] SERVICES [address] [place], [place], [address] Phone: [phone]   Fax:  [phone]   Name: April [name] MRN: [id number] Date of Birth: [birth year]

## 2017-06-08 ENCOUNTER — Telehealth: Payer: Self-pay | Admitting: Cardiovascular Disease

## 2017-06-08 NOTE — Telephone Encounter (Signed)
Will forward message to Dr. Rockey Situ to review and see if he feels any of the patient's cardiac meds could be affecting her speech:  Per medication list, current cardiac meds are: 1) amiodarone 2) amlodipine 3) plavix 4) eliquis 5) furosemide 6) losartan-hctz 7) metoprolol tartrate 8) crestor

## 2017-06-08 NOTE — Telephone Encounter (Signed)
Patient was told by neuro Dr. Manuella Ghazi that her speech issues could be caused by some of her cardio meds   Please call to discuss.

## 2017-06-09 NOTE — Telephone Encounter (Signed)
I am not aware of any meds "causing speech issues" Perhaps dr. Manuella Ghazi could be more specific? Of she could run them by pharmacy and see if they have any ideas ? thx TG

## 2017-06-11 NOTE — Telephone Encounter (Signed)
Patient verbalized understanding. She says that she thought Dr Manuella Ghazi was going to reach out to Dr Rockey Situ about the medications that could be causing the "quiver" in her voice. She has an appointment with Dr Manuella Ghazi tomorrow and she will discuss with him in detail. She will also check with pharmacy.

## 2017-06-12 DIAGNOSIS — R498 Other voice and resonance disorders: Secondary | ICD-10-CM | POA: Diagnosis not present

## 2017-06-13 ENCOUNTER — Ambulatory Visit: Payer: Medicare Other | Admitting: Speech Pathology

## 2017-06-13 ENCOUNTER — Encounter: Payer: Self-pay | Admitting: Speech Pathology

## 2017-06-13 ENCOUNTER — Other Ambulatory Visit: Payer: Self-pay | Admitting: Internal Medicine

## 2017-06-13 DIAGNOSIS — R490 Dysphonia: Secondary | ICD-10-CM

## 2017-06-13 DIAGNOSIS — Z1231 Encounter for screening mammogram for malignant neoplasm of breast: Secondary | ICD-10-CM

## 2017-06-13 NOTE — Therapy (Signed)
[place] MAIN [facility] SERVICES [address] [place], [place], [address] Phone: [phone]   Fax:  [phone]  Speech Language Pathology Treatment  Patient Details  Name: April [name] MRN: [id number] Date of Birth: 03/23/1941 Referring Provider: Dr. Manuella Ghazi   Encounter Date: 06/13/2017  End of Session - 06/13/17 1555    Visit Number  6    Number of Visits  17    Date for SLP Re-Evaluation  07/09/17    SLP Start Time  1500    SLP Stop Time   1546    SLP Time Calculation (min)  46 min    Activity Tolerance  Patient tolerated treatment well       Past Medical History:  Diagnosis Date  . Arrhythmia    history of an irregular heart beat  . CAD S/P PCI pRCA Promus DES 3.5 x 16 (4.1 mm), ostRPDA Promus DES 2.5 x 12 (2.7 mm) 03/25/2016  . Diverticulitis   . Endometriosis   . Heart murmur    no significant valvular lesion noted on Echo  . Hiatal hernia   . History of colon polyps 08/1994  . Hypercholesterolemia   . Hypertension   . Migraines    history of migraines  . Osteoporosis   . Rheumatic fever   . Sleep apnea    CPAP  . ST elevation myocardial infarction (STEMI) of inferolateral wall, initial episode of care ([place]) 03/25/2016  . Urine incontinence     Past Surgical History:  Procedure Laterality Date  . ABDOMINAL HYSTERECTOMY     ovaries not removed  . APPENDECTOMY     was removed during hysterectomy  . Breast biopsies     x2  . BREAST BIOPSY Bilateral "years ago"  . BREAST SURGERY Bilateral   . CARDIAC CATHETERIZATION  2011   moderate 40% RCA disease  . CARDIAC CATHETERIZATION  01/2010   Dr. Gollan@[facility] : Only noted 40% RCA  . CARDIAC CATHETERIZATION N/A 03/25/2016   Procedure: Left Heart Cath and Coronary Angiography;  Surgeon: 04/07/2016, MD;  Location: [place] CV LAB;  Service: Cardiovascular;  Laterality: N/A;  . CARDIAC CATHETERIZATION N/A 03/25/2016   Procedure: Coronary Stent Intervention;  Surgeon:  04/07/2016, MD;  Location: [place] CV LAB;  Service: Cardiovascular;  Laterality: N/A;  . COLONOSCOPY  2013  . ESOPHAGOGASTRODUODENOSCOPY (EGD) WITH PROPOFOL N/A 03/17/2015   Procedure: ESOPHAGOGASTRODUODENOSCOPY (EGD) WITH PROPOFOL;  Surgeon: 03/30/2015, MD;  Location: [facility] ENDOSCOPY;  Service: Gastroenterology;  Laterality: N/A;  . NM MYOVIEW ([place] HX)  07/2015   No evidence ischemia or infarction. EF 66%. Low risk  . TONSILLECTOMY    . TRANSTHORACIC ECHOCARDIOGRAM  10/2013   Normal LV size and function. EF 55-65%. GR 1 DD. Otherwise normal.    There were no vitals filed for this visit.  Subjective Assessment - 06/13/17 1553    Subjective  Patient reports that several people have remarked on her improved voice.            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 79 for voice therapy.         Treatment Provided   Treatment provided  Cognitive-Linquistic      Pain Assessment   Pain Assessment  No/denies pain      Cognitive-Linquistic Treatment  Treatment focused on  Voice    Skilled Treatment  The patient was provided with written and verbal teaching regarding vocal hygiene (reduce throat clearing).  The patient was provided with written and verbal teaching regarding tongue and throat stretches exercises to promote relaxed phonation. The patient was provided with written and verbal teaching regarding breathing strategies (paused breathing, belly breathing, straw breathing).  The patient identifies cough triggers as breathing, talking, and laughing.  The patient reports decreased coughing.  The patient uses a habitual low pitch, resonant techniques used with a higher pitch.  Patient instructed in resonant voice technique (hum, hummed "n").  Patient able to maintain clear, forward voice with hum, initial /n/ syllables,  initial /n/ words, and automatic speech series with 75% accuracy.      Assessment / Recommendations / Plan   Plan  Continue with current plan of care      Progression Toward Goals   Progression toward goals  Progressing toward goals       SLP Education - 06/13/17 1553    Education provided  Yes    Education Details  resonant voice    Person(s) Educated  Patient    Methods  Explanation    Comprehension  Verbalized understanding         SLP Long Term Goals - 06/07/17 1142      SLP LONG TERM GOAL #1   Title  The patient will demonstrate independent understanding of vocal hygiene concepts and extrinsic laryngeal muscle stretches.      Time  5    Period  Weeks    Status  On-going    Target Date  07/09/17      SLP LONG TERM GOAL #2   Title  The patient will be independent for abdominal breathing and breath support exercises.    Time  5    Period  Weeks    Status  On-going    Target Date  07/09/17      SLP LONG TERM GOAL #3   Title  The patient will minimize vocal tension via resonant voice therapy (or comparable technique) with min SLP cues with 80% accuracy.    Time  5    Period  Weeks    Status  On-going    Target Date  07/09/17      SLP LONG TERM GOAL #4   Title  The patient will maintain relaxed phonation / oral resonance and minimize tremor identification for paragraph length recitation with 80% accuracy.    Time  5    Period  Weeks    Status  On-going    Target Date  07/09/17       Plan - 06/13/17 1556    Clinical Impression Statement   Patient able to improve vocal quality with nasality to improve oral resonance and semi-occluded vocal tract to decrease laryngeal strain.     Speech Therapy Frequency  2x / week    Duration  Other (comment)    Treatment/Interventions  Patient/family education;SLP instruction and feedback;Multimodal communcation approach;Functional tasks;Internal/external aids Voice therapy    Potential to Achieve Goals  Good    Potential  Considerations  Ability to learn/carryover information;Severity of impairments;Co-morbidities;Cooperation/participation level;Family/community support;Medical prognosis;Pain level;Previous level of function    SLP Home Exercise Plan  continue breathing strategies, tongue and throat stretches, resonant voice exercise, increase water intake, be aware of tension, breathing    Consulted and Agree with Plan of Care  Patient       Patient will  benefit from skilled therapeutic intervention in order to improve the following deficits and impairments:   Dysphonia    Problem List Patient Active Problem List   Diagnosis Date Noted  . Paroxysmal atrial fibrillation ([place]) 03/27/2016  . ST elevation myocardial infarction (STEMI) of inferolateral wall, initial episode of care ([place]) 03/25/2016  . CAD S/P PCI pRCA Promus DES 3.5 x 16 (4.1 mm), ostRPDA Promus DES 2.5 x 12 (2.7 mm) 03/25/2016  . Right knee pain 11/13/2015  . Coronary artery disease involving native coronary artery of native heart with unstable angina pectoris ([place]) 06/01/2015  . Diabetes mellitus ([place]) 05/16/2015  . Gastritis 04/01/2015  . Chest pain 04/01/2015  . Hiatal hernia 08/25/2014  . DOE (dyspnea on exertion) 07/15/2014  . Congestion of throat 07/06/2014  . Health care maintenance 07/06/2014  . Fibrocystic breast disease 07/06/2014  . BMI 37.0-37.9, adult 04/19/2014  . Stress 02/16/2013  . Rib pain on right side 02/16/2013  . SOB (shortness of breath) 10/13/2012  . GERD (gastroesophageal reflux disease) 10/13/2012  . Essential hypertension, benign 08/18/2012  . Hypercholesterolemia 08/18/2012  . History of colonic polyps 08/18/2012  . Obstructive sleep apnea 08/18/2012  . Osteoporosis 08/18/2012   Leroy Sea, MS/CCC- SLP  Lou Miner 06/13/2017, 3:58 PM  [place] MAIN [facility] SERVICES [address] [place], [place], [address] Phone: [phone]   Fax:   [phone]   Name: April [name] MRN: [id number] Date of Birth: 06-07-1940

## 2017-06-15 ENCOUNTER — Other Ambulatory Visit: Payer: Self-pay | Admitting: Cardiovascular Disease

## 2017-06-15 ENCOUNTER — Ambulatory Visit: Payer: Medicare Other | Admitting: Speech Pathology

## 2017-06-15 ENCOUNTER — Encounter: Payer: Self-pay | Admitting: Speech Pathology

## 2017-06-15 DIAGNOSIS — R490 Dysphonia: Secondary | ICD-10-CM

## 2017-06-15 NOTE — Therapy (Signed)
[place] MAIN [facility] SERVICES [address] [place], [place], [address] Phone: [phone]   Fax:  [phone]  Speech Language Pathology Treatment  Patient Details  Name: April [name] MRN: [id number] Date of Birth: Jul 30, 1940 Referring Provider: Dr. Manuella Ghazi   Encounter Date: 06/15/2017  End of Session - 06/15/17 1458    Visit Number  7    Number of Visits  17    Date for SLP Re-Evaluation  07/09/17    SLP Start Time  1400    SLP Stop Time   1448    SLP Time Calculation (min)  48 min    Activity Tolerance  Patient tolerated treatment well       Past Medical History:  Diagnosis Date  . Arrhythmia    history of an irregular heart beat  . CAD S/P PCI pRCA Promus DES 3.5 x 16 (4.1 mm), ostRPDA Promus DES 2.5 x 12 (2.7 mm) 03/25/2016  . Diverticulitis   . Endometriosis   . Heart murmur    no significant valvular lesion noted on Echo  . Hiatal hernia   . History of colon polyps 08/1994  . Hypercholesterolemia   . Hypertension   . Migraines    history of migraines  . Osteoporosis   . Rheumatic fever   . Sleep apnea    CPAP  . ST elevation myocardial infarction (STEMI) of inferolateral wall, initial episode of care ([place]) 03/25/2016  . Urine incontinence     Past Surgical History:  Procedure Laterality Date  . ABDOMINAL HYSTERECTOMY     ovaries not removed  . APPENDECTOMY     was removed during hysterectomy  . Breast biopsies     x2  . BREAST BIOPSY Bilateral "years ago"  . BREAST SURGERY Bilateral   . CARDIAC CATHETERIZATION  2011   moderate 40% RCA disease  . CARDIAC CATHETERIZATION  01/2010   Dr. Gollan@[email or web address] : Only noted 40% RCA  . CARDIAC CATHETERIZATION N/A 03/25/2016   Procedure: Left Heart Cath and Coronary Angiography;  Surgeon: 04/07/2016, MD;  Location: [place] CV [facility];  Service: Cardiovascular;  Laterality: N/A;  . CARDIAC CATHETERIZATION N/A 03/25/2016   Procedure: Coronary Stent Intervention;  Surgeon:  04/07/2016, MD;  Location: [place] CV [facility];  Service: Cardiovascular;  Laterality: N/A;  . COLONOSCOPY  2013  . ESOPHAGOGASTRODUODENOSCOPY (EGD) WITH PROPOFOL N/A 03/17/2015   Procedure: ESOPHAGOGASTRODUODENOSCOPY (EGD) WITH PROPOFOL;  Surgeon: 03/30/2015, MD;  Location: [facility] ENDOSCOPY;  Service: Gastroenterology;  Laterality: N/A;  . NM MYOVIEW ([place] HX)  07/2015   No evidence ischemia or infarction. EF 66%. Low risk  . TONSILLECTOMY    . TRANSTHORACIC ECHOCARDIOGRAM  10/2013   Normal LV size and function. EF 55-65%. GR 1 DD. Otherwise normal.    There were no vitals filed for this visit.  Subjective Assessment - 06/15/17 1456    Subjective  Patient reports that she is very tired today.  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 77 for voice therapy.         Treatment Provided   Treatment provided  Cognitive-Linquistic      Pain Assessment   Pain Assessment  No/denies pain      Cognitive-Linquistic Treatment   Treatment focused on  Voice    Skilled Treatment  The patient was provided with written and verbal teaching regarding vocal hygiene (reduce throat clearing).  The patient was provided with written and verbal teaching regarding tongue and throat stretches exercises to promote relaxed phonation. The patient was provided with written and verbal teaching regarding breathing strategies (paused breathing, belly breathing, straw breathing).  The patient identifies cough triggers as breathing, talking, and laughing.  The patient reports decreased coughing.  The patient uses a habitual low pitch, resonant techniques used with a higher pitch.  Patient instructed in resonant voice technique (hum, hummed "n").  Patient able to maintain clear,  forward voice with hum, initial /n/ syllables, initial /n/ words, and automatic speech series with 65% accuracy.  Patient maintain clear, forward focused voice with reading 3 word phrases and generating simple phrases with 50% accuracy.      Assessment / Recommendations / Plan   Plan  Continue with current plan of care      Progression Toward Goals   Progression toward goals  Progressing toward goals       SLP Education - 06/15/17 1457    Education provided  Yes    Education Details  resonant voice, try tongue/throat stretches to relax vocal tension    Person(s) Educated  Patient    Methods  Explanation    Comprehension  Verbalized understanding         SLP Long Term Goals - 06/07/17 1142      SLP LONG TERM GOAL #1   Title  The patient will demonstrate independent understanding of vocal hygiene concepts and extrinsic laryngeal muscle stretches.      Time  5    Period  Weeks    Status  On-going    Target Date  07/09/17      SLP LONG TERM GOAL #2   Title  The patient will be independent for abdominal breathing and breath support exercises.    Time  5    Period  Weeks    Status  On-going    Target Date  07/09/17      SLP LONG TERM GOAL #3   Title  The patient will minimize vocal tension via resonant voice therapy (or comparable technique) with min SLP cues with 80% accuracy.    Time  5    Period  Weeks    Status  On-going    Target Date  07/09/17      SLP LONG TERM GOAL #4   Title  The patient will maintain relaxed phonation / oral resonance and minimize tremor identification for paragraph length recitation with 80% accuracy.    Time  5    Period  Weeks    Status  On-going    Target Date  07/09/17       Plan - 06/15/17 1458    Clinical Impression Statement   Patient able to improve vocal quality with nasality to improve oral resonance and semi-occluded vocal tract to decrease laryngeal strain.       Speech Therapy Frequency  2x / week    Duration  Other (comment)     Treatment/Interventions  Patient/family education;SLP instruction and feedback;Multimodal communcation approach;Functional tasks;Internal/external aids Voice therapy    Potential to Achieve Goals  Good    Potential Considerations  Ability to learn/carryover information;Severity of impairments;Co-morbidities;Cooperation/participation level;Family/community support;Medical prognosis;Pain level;Previous level of function    SLP Home Exercise Plan  continue breathing strategies, tongue and throat stretches, resonant voice exercise, increase water intake, be aware of tension, breathing    Consulted and Agree with Plan of Care  Patient       Patient will benefit from skilled therapeutic intervention in order to improve the following deficits and impairments:   Dysphonia    Problem List Patient Active Problem List   Diagnosis Date Noted  . Paroxysmal atrial fibrillation ([place]) 03/27/2016  . ST elevation myocardial infarction (STEMI) of inferolateral wall, initial episode of care ([place]) 03/25/2016  . CAD S/P PCI pRCA Promus DES 3.5 x 16 (4.1 mm), ostRPDA Promus DES 2.5 x 12 (2.7 mm) 03/25/2016  . Right knee pain 11/13/2015  . Coronary artery disease involving native coronary artery of native heart with unstable angina pectoris ([place]) 06/01/2015  . Diabetes mellitus ([place]) 05/16/2015  . Gastritis 04/01/2015  . Chest pain 04/01/2015  . Hiatal hernia 08/25/2014  . DOE (dyspnea on exertion) 07/15/2014  . Congestion of throat 07/06/2014  . Health care maintenance 07/06/2014  . Fibrocystic breast disease 07/06/2014  . BMI 37.0-37.9, adult 04/19/2014  . Stress 02/16/2013  . Rib pain on right side 02/16/2013  . SOB (shortness of breath) 10/13/2012  . GERD (gastroesophageal reflux disease) 10/13/2012  . Essential hypertension, benign 08/18/2012  . Hypercholesterolemia 08/18/2012  . History of colonic polyps 08/18/2012  . Obstructive sleep apnea 08/18/2012  . Osteoporosis 08/18/2012   Leroy Sea, MS/CCC- SLP  Lou Miner 06/15/2017, 2:59 PM  [place] MAIN [facility] SERVICES [address] [place], [place], [address] Phone: [phone]   Fax:  [phone]   Name: KEISA BLOW MRN: [id number] Date of Birth: 11-Nov-1940

## 2017-06-18 ENCOUNTER — Ambulatory Visit: Payer: Medicare Other | Admitting: Speech Pathology

## 2017-06-19 ENCOUNTER — Encounter: Payer: Self-pay | Admitting: Speech Pathology

## 2017-06-19 ENCOUNTER — Ambulatory Visit: Payer: Medicare Other | Admitting: Speech Pathology

## 2017-06-19 DIAGNOSIS — R490 Dysphonia: Secondary | ICD-10-CM

## 2017-06-19 NOTE — Therapy (Signed)
[place] MAIN [facility] SERVICES [address] [place], [place], [address] Phone: [phone]   Fax:  [phone]  Speech Language Pathology Treatment  Patient Details  Name: DARCELL YACOUB MRN: [id number] Date of Birth: 1940/11/05 Referring Provider: Dr. Manuella Ghazi   Encounter Date: 06/19/2017  End of Session - 06/19/17 1000    Visit Number  8    Number of Visits  17    Date for SLP Re-Evaluation  07/09/17    SLP Start Time  0900    SLP Stop Time   0955    SLP Time Calculation (min)  55 min    Activity Tolerance  Patient tolerated treatment well       Past Medical History:  Diagnosis Date  . Arrhythmia    history of an irregular heart beat  . CAD S/P PCI pRCA Promus DES 3.5 x 16 (4.1 mm), ostRPDA Promus DES 2.5 x 12 (2.7 mm) 03/25/2016  . Diverticulitis   . Endometriosis   . Heart murmur    no significant valvular lesion noted on Echo  . Hiatal hernia   . History of colon polyps 08/1994  . Hypercholesterolemia   . Hypertension   . Migraines    history of migraines  . Osteoporosis   . Rheumatic fever   . Sleep apnea    CPAP  . ST elevation myocardial infarction (STEMI) of inferolateral wall, initial episode of care ([place]) 03/25/2016  . Urine incontinence     Past Surgical History:  Procedure Laterality Date  . ABDOMINAL HYSTERECTOMY     ovaries not removed  . APPENDECTOMY     was removed during hysterectomy  . Breast biopsies     x2  . BREAST BIOPSY Bilateral "years ago"  . BREAST SURGERY Bilateral   . CARDIAC CATHETERIZATION  2011   moderate 40% RCA disease  . CARDIAC CATHETERIZATION  01/2010   Dr. [email or web address] : Only noted 40% RCA  . CARDIAC CATHETERIZATION N/A 03/25/2016   Procedure: Left Heart Cath and Coronary Angiography;  Surgeon: 04/07/2016, MD;  Location: [place] [facility] LAB;  Service: Cardiovascular;  Laterality: N/A;  . CARDIAC CATHETERIZATION N/A 03/25/2016   Procedure: Coronary Stent Intervention;  Surgeon:  04/07/2016, MD;  Location: [place] CV LAB;  Service: Cardiovascular;  Laterality: N/A;  . COLONOSCOPY  2013  . ESOPHAGOGASTRODUODENOSCOPY (EGD) WITH PROPOFOL N/A 03/17/2015   Procedure: ESOPHAGOGASTRODUODENOSCOPY (EGD) WITH PROPOFOL;  Surgeon: 03/30/2015, MD;  Location: [facility] ENDOSCOPY;  Service: Gastroenterology;  Laterality: N/A;  . NM MYOVIEW ([place] HX)  07/2015   No evidence ischemia or infarction. EF 66%. Low risk  . TONSILLECTOMY    . TRANSTHORACIC ECHOCARDIOGRAM  10/2013   Normal LV size and function. EF 55-65%. GR 1 DD. Otherwise normal.    There were no vitals filed for this visit.  Subjective Assessment - 06/19/17 0959    Subjective  Pt reports several people have commented that her voice sounds better    Currently in Pain?  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 77 for voice therapy.         Treatment Provided   Treatment provided  Cognitive-Linquistic      Pain Assessment   Pain Assessment  Faces    Faces Pain Scale  Hurts a little bit    Pain Location  back    Pain Descriptors / Indicators  Aching    Pain Intervention(s)  Monitored during session      Cognitive-Linquistic Treatment   Treatment focused on  Voice    Skilled Treatment The patient was provided with written and verbal teaching regarding:  vocal hygiene: reduce throat clearing, increased water intake, increasing awareness of tension (clenching jaw reported), humidifying home environment,  tongue and throat stretches/exercises: for relaxed phonation, including yawn/sigh and straw phonation, neck stretches breathing strategies: paused breathing, belly breathing, straw breathing.    The patient identifies cough triggers as breathing, talking, and laughing.  Increased phlegm and throat clearing noted today. The patient continues to use a habitual low pitch, and was encouraged to raise pitch as able. She brought water again today, and sipped on it throughout the session. She was encouraged to use all senses to decrease tension: smells, sounds, visualization.   She reports she has changed when she takes her reflux meds, moving PPI to her bathroom where it will be more convenient to take it first thing in the morning, 30-45 minutes prior to other food or beverage. She also indicated today that several people have commented on the improvement in her voice.     Assessment / Recommendations / Plan   Plan  Continue with current plan of care      Progression Toward Goals   Progression toward goals  Progressing toward goals       SLP Education - 06/19/17 1000    Education provided  Yes    Education Details  neck stretches, imagery, yawn to relax tension    Person(s) Educated  Patient    Methods  Explanation;Demonstration;Verbal cues;Handout    Comprehension  Verbalized understanding;Returned demonstration;Need further instruction         SLP Long Term Goals - 06/19/17 0904      SLP LONG TERM GOAL #1   Title  Pt will demonstrate independent understanding of vocal hygeine concepts, and neck/shoulder/lingual stretching exercises    Time  4    Period  Weeks    Status  On-going    Target Date  07/09/17      SLP LONG TERM GOAL #2   Title  The patient will be independent for abdominal breathing and breath support exercises.    Time  4    Period  Weeks    Status  On-going    Target Date  07/09/17      SLP LONG TERM GOAL #3   Title  The patient will minimize vocal tension via resonant voice therapy (or comparable technique) with min SLP cues with 80% accuracy.    Time  4    Period  Weeks    Status  On-going    Target Date  07/09/17      SLP LONG TERM GOAL #4   Title  The patient will maintain relaxed phonation / oral resonance and minimize  tremor identification for paragraph length recitation with 80% accuracy.    Time  4    Period  Weeks    Status  On-going    Target Date  07/09/17      SLP LONG TERM GOAL #5   Period  Days       Plan - 06/19/17 1000    Clinical Impression Statement  Pt  reports more secretions in the morning, more tremor as the day progresses. Pt was encouraged to increase awareness of increased tension and use stretching/yawn to decrease tension. Continued ST intervention is recommended to maximize vocal health and quality.    Speech Therapy Frequency  2x / week    Duration  -- 8 weeks    Treatment/Interventions  Patient/family education;SLP instruction and feedback;Multimodal communcation approach;Functional tasks;Internal/external aids    Potential to Achieve Goals  Good    Potential Considerations  Ability to learn/carryover information;Severity of impairments;Co-morbidities;Cooperation/participation level;Family/community support;Medical prognosis;Pain level;Previous level of function    SLP Home Exercise Plan  continue breathing strategies, tongue and throat stretches, resonant voice exercise, increase water intake, be aware of tension, breathing    Consulted and Agree with Plan of Care  Patient       Patient will benefit from skilled therapeutic intervention in order to improve the following deficits and impairments:   Dysphonia    Problem List Patient Active Problem List   Diagnosis Date Noted  . Paroxysmal atrial fibrillation ([place]) 03/27/2016  . ST elevation myocardial infarction (STEMI) of inferolateral wall, initial episode of care ([place]) 03/25/2016  . CAD S/P PCI pRCA Promus DES 3.5 x 16 (4.1 mm), ostRPDA Promus DES 2.5 x 12 (2.7 mm) 03/25/2016  . Right knee pain 11/13/2015  . Coronary artery disease involving native coronary artery of native heart with unstable angina pectoris ([place]) 06/01/2015  . Diabetes mellitus ([place]) 05/16/2015  . Gastritis 04/01/2015  . Chest pain 04/01/2015  .  Hiatal hernia 08/25/2014  . DOE (dyspnea on exertion) 07/15/2014  . Congestion of throat 07/06/2014  . Health care maintenance 07/06/2014  . Fibrocystic breast disease 07/06/2014  . BMI 37.0-37.9, adult 04/19/2014  . Stress 02/16/2013  . Rib pain on right side 02/16/2013  . SOB (shortness of breath) 10/13/2012  . GERD (gastroesophageal reflux disease) 10/13/2012  . Essential hypertension, benign 08/18/2012  . Hypercholesterolemia 08/18/2012  . History of colonic polyps 08/18/2012  . Obstructive sleep apnea 08/18/2012  . Osteoporosis 08/18/2012   Talene Glastetter [facility]. [facility], CCC-SLP Speech Language Pathologist  Shonna Chock 06/19/2017, 10:08 AM  [place] MAIN [facility] SERVICES [address] [place], [place], [address] Phone: [phone]   Fax:  [phone]   Name: AZZURE GARABEDIAN MRN: [id number] Date of Birth: 09/13/1940

## 2017-06-20 ENCOUNTER — Encounter: Payer: Medicare Other | Admitting: Speech Pathology

## 2017-06-20 DIAGNOSIS — R498 Other voice and resonance disorders: Secondary | ICD-10-CM | POA: Insufficient documentation

## 2017-06-22 ENCOUNTER — Encounter: Payer: Self-pay | Admitting: Speech Pathology

## 2017-06-22 ENCOUNTER — Ambulatory Visit: Payer: Medicare Other | Admitting: Speech Pathology

## 2017-06-22 DIAGNOSIS — R490 Dysphonia: Secondary | ICD-10-CM | POA: Diagnosis not present

## 2017-06-22 NOTE — Therapy (Signed)
[place] MAIN [facility] SERVICES [address] [place], [place], [address] Phone: [phone]   Fax:  [phone]  Speech Language Pathology Treatment  Patient Details  Name: April [name] MRN: [id number] Date of Birth: 1940-07-10 Referring Provider: Dr. Manuella Ghazi   Encounter Date: 06/22/2017  End of Session - 06/22/17 1602    Visit Number  9    Number of Visits  17    Date for SLP Re-Evaluation  07/09/17    SLP Start Time  1500    SLP Stop Time   1542    SLP Time Calculation (min)  42 min    Activity Tolerance  Patient tolerated treatment well;Patient limited by fatigue       Past Medical History:  Diagnosis Date  . Arrhythmia    history of an irregular heart beat  . CAD S/P PCI pRCA Promus DES 3.5 x 16 (4.1 mm), ostRPDA Promus DES 2.5 x 12 (2.7 mm) 03/25/2016  . Diverticulitis   . Endometriosis   . Heart murmur    no significant valvular lesion noted on Echo  . Hiatal hernia   . History of colon polyps 08/1994  . Hypercholesterolemia   . Hypertension   . Migraines    history of migraines  . Osteoporosis   . Rheumatic fever   . Sleep apnea    CPAP  . ST elevation myocardial infarction (STEMI) of inferolateral wall, initial episode of care ([place]) 03/25/2016  . Urine incontinence     Past Surgical History:  Procedure Laterality Date  . ABDOMINAL HYSTERECTOMY     ovaries not removed  . APPENDECTOMY     was removed during hysterectomy  . Breast biopsies     x2  . BREAST BIOPSY Bilateral "years ago"  . BREAST SURGERY Bilateral   . CARDIAC CATHETERIZATION  2011   moderate 40% RCA disease  . CARDIAC CATHETERIZATION  01/2010   Dr. Gollan@[facility] : Only noted 40% RCA  . CARDIAC CATHETERIZATION N/A 03/25/2016   Procedure: Left Heart Cath and Coronary Angiography;  Surgeon: 04/07/2016, MD;  Location: [place] CV [facility];  Service: Cardiovascular;  Laterality: N/A;  . CARDIAC CATHETERIZATION N/A 03/25/2016   Procedure: Coronary  Stent Intervention;  Surgeon: 04/07/2016, MD;  Location: [place] CV LAB;  Service: Cardiovascular;  Laterality: N/A;  . COLONOSCOPY  2013  . ESOPHAGOGASTRODUODENOSCOPY (EGD) WITH PROPOFOL N/A 03/17/2015   Procedure: ESOPHAGOGASTRODUODENOSCOPY (EGD) WITH PROPOFOL;  Surgeon: 03/30/2015, MD;  Location: [facility] ENDOSCOPY;  Service: Gastroenterology;  Laterality: N/A;  . NM MYOVIEW ([place] HX)  07/2015   No evidence ischemia or infarction. EF 66%. Low risk  . TONSILLECTOMY    . TRANSTHORACIC ECHOCARDIOGRAM  10/2013   Normal LV size and function. EF 55-65%. GR 1 DD. Otherwise normal.    There were no vitals filed for this visit.  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 77 for voice therapy.         Treatment Provided   Treatment provided  Cognitive-Linquistic      Pain Assessment   Pain Assessment  No/denies pain    Faces Pain Scale  Hurts a little bit  Pain Location  back    Pain Descriptors / Indicators  Aching      Cognitive-Linquistic Treatment   Treatment focused on  Voice    Skilled Treatment  The patient was provided with written and verbal teaching regarding vocal hygiene (reduce throat clearing).  The patient was provided with written and verbal teaching regarding tongue and throat stretches exercises to promote relaxed phonation. The patient was provided with written and verbal teaching regarding breathing strategies (paused breathing, belly breathing, straw breathing).  The patient identifies cough triggers as breathing, talking, and laughing.  The patient reports decreased coughing.  The patient uses a habitual low pitch, resonant techniques used with a higher pitch.  Patient reports a significant flair-up with  reflux and she has not been able to generate the clear vocal quality she achieved the last session we had.  Today she was able to generate a clear vocal quality with nasality with approximately 25% accuracy (last time 65%).  The patient reports feeling "tight" in her throat and thinks it may be an attempt to prevent refluxing into her pharynx.      Assessment / Recommendations / Plan   Plan  Continue with current plan of care      Progression Toward Goals   Progression toward goals  Progressing toward goals       SLP Education - 06/22/17 1601    Education provided  Yes    Education Details  practice when you are not experiencing the severe reflux    Person(s) Educated  Patient    Methods  Explanation    Comprehension  Verbalized understanding         SLP Long Term Goals - 06/19/17 0904      SLP LONG TERM GOAL #1   Title  Pt will demonstrate independent understanding of vocal hygeine concepts, and neck/shoulder/lingual stretching exercises    Time  4    Period  Weeks    Status  On-going    Target Date  07/09/17      SLP LONG TERM GOAL #2   Title  The patient will be independent for abdominal breathing and breath support exercises.    Time  4    Period  Weeks    Status  On-going    Target Date  07/09/17      SLP LONG TERM GOAL #3   Title  The patient will minimize vocal tension via resonant voice therapy (or comparable technique) with min SLP cues with 80% accuracy.    Time  4    Period  Weeks    Status  On-going    Target Date  07/09/17      SLP LONG TERM GOAL #4   Title  The patient will maintain relaxed phonation / oral resonance and minimize tremor identification for paragraph length recitation with 80% accuracy.    Time  4    Period  Weeks    Status  On-going    Target Date  07/09/17      SLP LONG TERM GOAL #5   Period  Days       Plan - 06/22/17 1603    Clinical Impression Statement  Patient reports a significant flair-up with reflux and she has not been  able to generate the clear vocal quality she achieved the last session we had.  The patient reports feeling "tight" in her throat and thinks it may be an attempt to prevent refluxing into her pharynx.  The patient will focus  vocal exercises when she is not fighting reflux.    Speech Therapy Frequency  2x / week    Treatment/Interventions  Patient/family education;SLP instruction and feedback;Multimodal communcation approach;Functional tasks;Internal/external aids;Other (comment) Voice therapy    Potential to Achieve Goals  Good    Potential Considerations  Ability to learn/carryover information;Severity of impairments;Co-morbidities;Cooperation/participation level;Family/community support;Medical prognosis;Pain level;Previous level of function    SLP Home Exercise Plan  continue breathing strategies, tongue and throat stretches, resonant voice exercise, increase water intake, be aware of tension, breathing    Consulted and Agree with Plan of Care  Patient       Patient will benefit from skilled therapeutic intervention in order to improve the following deficits and impairments:   Dysphonia    Problem List Patient Active Problem List   Diagnosis Date Noted  . Paroxysmal atrial fibrillation ([place]) 03/27/2016  . ST elevation myocardial infarction (STEMI) of inferolateral wall, initial episode of care ([place]) 03/25/2016  . CAD S/P PCI pRCA Promus DES 3.5 x 16 (4.1 mm), ostRPDA Promus DES 2.5 x 12 (2.7 mm) 03/25/2016  . Right knee pain 11/13/2015  . Coronary artery disease involving native coronary artery of native heart with unstable angina pectoris ([place]) 06/01/2015  . Diabetes mellitus ([place]) 05/16/2015  . Gastritis 04/01/2015  . Chest pain 04/01/2015  . Hiatal hernia 08/25/2014  . DOE (dyspnea on exertion) 07/15/2014  . Congestion of throat 07/06/2014  . Health care maintenance 07/06/2014  . Fibrocystic breast disease 07/06/2014  . BMI 37.0-37.9, adult 04/19/2014  . Stress 02/16/2013  .  Rib pain on right side 02/16/2013  . SOB (shortness of breath) 10/13/2012  . GERD (gastroesophageal reflux disease) 10/13/2012  . Essential hypertension, benign 08/18/2012  . Hypercholesterolemia 08/18/2012  . History of colonic polyps 08/18/2012  . Obstructive sleep apnea 08/18/2012  . Osteoporosis 08/18/2012   Leroy Sea, MS/CCC- SLP  Lou Miner 06/22/2017, 4:06 PM  [place] MAIN [facility] SERVICES [address] [place], [place], [address] Phone: [phone]   Fax:  [phone]   Name: April [name] MRN: [id number] Date of Birth: February 25, 1941

## 2017-06-26 ENCOUNTER — Telehealth: Payer: Self-pay | Admitting: Cardiovascular Disease

## 2017-06-26 NOTE — Telephone Encounter (Signed)
Received fax with note as follows:  Dr. Rockey Situ, Will you please consider switching her to propranolol and see if it helps with her vocal tremors? Thanks, Hemang [employer] to Dr. Rockey Situ for further review.

## 2017-06-28 NOTE — Telephone Encounter (Signed)
She can try propanolol 10 mg TID Stop the metoprolol (would not throw it away) Will likely not be able to use big enough doses of propanolol given low heart rate, may not help vocal tremor

## 2017-06-29 ENCOUNTER — Ambulatory Visit: Payer: Medicare Other | Admitting: Speech Pathology

## 2017-06-29 MED ORDER — PROPRANOLOL HCL 10 MG PO TABS: 10.0000 mg | ORAL_TABLET | Freq: Three times a day (TID) | ORAL | 3 refills | Status: DC

## 2017-06-29 NOTE — Telephone Encounter (Signed)
I spoke with patient. She requests 90 day supply of propranolol to [facility]. Patient understands to stop taking metoprolol when she starts propranolol. Medication list updated.

## 2017-07-05 ENCOUNTER — Ambulatory Visit
Admission: RE | Admit: 2017-07-05 | Discharge: 2017-07-05 | Disposition: A | Discharge status: Home / Self Care | Payer: Medicare Other | Source: Ambulatory Visit | Attending: Internal Medicine | Admitting: Internal Medicine

## 2017-07-05 DIAGNOSIS — Z1231 Encounter for screening mammogram for malignant neoplasm of breast: Secondary | ICD-10-CM | POA: Diagnosis not present

## 2017-07-06 ENCOUNTER — Ambulatory Visit: Payer: Medicare Other | Admitting: Speech Pathology

## 2017-07-12 ENCOUNTER — Other Ambulatory Visit: Payer: Self-pay | Admitting: Cardiovascular Disease

## 2017-08-07 ENCOUNTER — Other Ambulatory Visit (INDEPENDENT_AMBULATORY_CARE_PROVIDER_SITE_OTHER): Payer: Medicare Other

## 2017-08-07 DIAGNOSIS — I10 Essential (primary) hypertension: Secondary | ICD-10-CM | POA: Diagnosis not present

## 2017-08-07 DIAGNOSIS — E1159 Type 2 diabetes mellitus with other circulatory complications: Secondary | ICD-10-CM | POA: Diagnosis not present

## 2017-08-07 DIAGNOSIS — E7800 Pure hypercholesterolemia, unspecified: Secondary | ICD-10-CM

## 2017-08-07 LAB — TSH: TSH: 3.3 u[IU]/mL (ref 0.35–4.50)

## 2017-08-07 LAB — BASIC METABOLIC PANEL
BUN: 16 mg/dL (ref 6–23)
CO2: 32 meq/L (ref 19–32)
Calcium: 8.7 mg/dL (ref 8.4–10.5)
Chloride: 106 mEq/L (ref 96–112)
Creatinine, Ser: 1.15 mg/dL (ref 0.40–1.20)
GFR: 48.4 mL/min — AB (ref >60.00)
GLUCOSE: 95 mg/dL (ref 70–99)
POTASSIUM: 3.7 meq/L (ref 3.5–5.1)
Sodium: 143 mEq/L (ref 135–145)

## 2017-08-07 LAB — LIPID PANEL
CHOLESTEROL: 133 mg/dL (ref 0–200)
HDL: 69.6 mg/dL (ref >39.00)
LDL CALC: 48 mg/dL (ref 0–99)
NonHDL: 63.52
TRIGLYCERIDES: 76 mg/dL (ref 0.0–149.0)
Total CHOL/HDL Ratio: 2
VLDL: 15.2 mg/dL (ref 0.0–40.0)

## 2017-08-07 LAB — HEPATIC FUNCTION PANEL
ALBUMIN: 3.5 g/dL (ref 3.5–5.2)
ALK PHOS: 48 U/L (ref 39–117)
ALT: 34 U/L (ref 0–35)
AST: 39 U/L — AB (ref 0–37)
BILIRUBIN DIRECT: 0.1 mg/dL (ref 0.0–0.3)
TOTAL PROTEIN: 6.3 g/dL (ref 6.0–8.3)
Total Bilirubin: 0.6 mg/dL (ref 0.2–1.2)

## 2017-08-07 LAB — HEMOGLOBIN A1C: Hgb A1c MFr Bld: 6.4 % (ref 4.6–6.5)

## 2017-08-08 DIAGNOSIS — M1710 Unilateral primary osteoarthritis, unspecified knee: Secondary | ICD-10-CM | POA: Insufficient documentation

## 2017-08-08 DIAGNOSIS — M47816 Spondylosis without myelopathy or radiculopathy, lumbar region: Secondary | ICD-10-CM | POA: Insufficient documentation

## 2017-08-08 DIAGNOSIS — M179 Osteoarthritis of knee, unspecified: Secondary | ICD-10-CM | POA: Insufficient documentation

## 2017-08-08 DIAGNOSIS — M5136 Other intervertebral disc degeneration, lumbar region: Secondary | ICD-10-CM | POA: Insufficient documentation

## 2017-08-08 DIAGNOSIS — M170 Bilateral primary osteoarthritis of knee: Secondary | ICD-10-CM | POA: Diagnosis not present

## 2017-08-08 DIAGNOSIS — M4696 Unspecified inflammatory spondylopathy, lumbar region: Secondary | ICD-10-CM | POA: Diagnosis not present

## 2017-08-09 ENCOUNTER — Ambulatory Visit (INDEPENDENT_AMBULATORY_CARE_PROVIDER_SITE_OTHER): Payer: Medicare Other | Admitting: Internal Medicine

## 2017-08-09 ENCOUNTER — Other Ambulatory Visit: Payer: Self-pay

## 2017-08-09 DIAGNOSIS — I10 Essential (primary) hypertension: Secondary | ICD-10-CM | POA: Diagnosis not present

## 2017-08-09 DIAGNOSIS — E7800 Pure hypercholesterolemia, unspecified: Secondary | ICD-10-CM

## 2017-08-09 DIAGNOSIS — F439 Reaction to severe stress, unspecified: Secondary | ICD-10-CM | POA: Diagnosis not present

## 2017-08-09 DIAGNOSIS — I25110 Atherosclerotic heart disease of native coronary artery with unstable angina pectoris: Secondary | ICD-10-CM

## 2017-08-09 DIAGNOSIS — K219 Gastro-esophageal reflux disease without esophagitis: Secondary | ICD-10-CM | POA: Diagnosis not present

## 2017-08-09 DIAGNOSIS — Z6838 Body mass index (BMI) 38.0-38.9, adult: Secondary | ICD-10-CM | POA: Diagnosis not present

## 2017-08-09 DIAGNOSIS — I480 Paroxysmal atrial fibrillation: Secondary | ICD-10-CM | POA: Diagnosis not present

## 2017-08-09 DIAGNOSIS — G4733 Obstructive sleep apnea (adult) (pediatric): Secondary | ICD-10-CM | POA: Diagnosis not present

## 2017-08-09 DIAGNOSIS — E1159 Type 2 diabetes mellitus with other circulatory complications: Secondary | ICD-10-CM | POA: Diagnosis not present

## 2017-08-09 MED ORDER — LOSARTAN POTASSIUM-HCTZ 100-12.5 MG PO TABS: 1.0000 | ORAL_TABLET | Freq: Every day | ORAL | 1 refills | Status: DC

## 2017-08-09 NOTE — Progress Notes (Signed)
Patient ID: April [name], female   DOB: May 27, 1940, 77 y.o.   MRN: [id number]   Subjective:    Patient ID: April [name], female    DOB: 27-Oct-1940, 77 y.o.   MRN: [id number]  HPI  Patient here for a scheduled follow up.  She reports she is doing relatively well.  Handling stress relatively well.  Has good support.  Does not feel needs anything more at this time.  Had root canal yesterday.  Seeing Dr Sabra Heck for her back.  Was placed on prednisone and flexeril.  Has helped.  Some intermittent constipation.  Overall relatively well controlled.  No chest pain.  Breathing stable.  Voice tremor.  Seeing neurology.  Recommended speech therapy.  Changed metoprolol to propranolol.  Using cpap.  No acid reflux.  No abdominal pain.     Past Medical History:  Diagnosis Date  . Arrhythmia    history of an irregular heart beat  . CAD S/P PCI pRCA Promus DES 3.5 x 16 (4.1 mm), ostRPDA Promus DES 2.5 x 12 (2.7 mm) 03/25/2016  . Diverticulitis   . Endometriosis   . Heart murmur    no significant valvular lesion noted on Echo  . Hiatal hernia   . History of colon polyps 08/1994  . Hypercholesterolemia   . Hypertension   . Migraines    history of migraines  . Osteoporosis   . Rheumatic fever   . Sleep apnea    CPAP  . ST elevation myocardial infarction (STEMI) of inferolateral wall, initial episode of care ([place]) 03/25/2016  . Urine incontinence    Past Surgical History:  Procedure Laterality Date  . ABDOMINAL HYSTERECTOMY     ovaries not removed  . APPENDECTOMY     was removed during hysterectomy  . Breast biopsies     x2  . BREAST BIOPSY Bilateral "years ago"  . BREAST SURGERY Bilateral   . CARDIAC CATHETERIZATION  2011   moderate 40% RCA disease  . CARDIAC CATHETERIZATION  01/2010   Dr. Suzie Portela : Only noted 40% RCA  . CARDIAC CATHETERIZATION N/A 03/25/2016   Procedure: Left Heart Cath and Coronary Angiography;  Surgeon: Leonie Man, MD;  Location: [place] [facility] LAB;   Service: Cardiovascular;  Laterality: N/A;  . CARDIAC CATHETERIZATION N/A 03/25/2016   Procedure: Coronary Stent Intervention;  Surgeon: Leonie Man, MD;  Location: [place] [facility];  Service: Cardiovascular;  Laterality: N/A;  . COLONOSCOPY  2013  . ESOPHAGOGASTRODUODENOSCOPY (EGD) WITH PROPOFOL N/A 03/17/2015   Procedure: ESOPHAGOGASTRODUODENOSCOPY (EGD) WITH PROPOFOL;  Surgeon: Christene Lye, MD;  Location: [facility] ENDOSCOPY;  Service: Gastroenterology;  Laterality: N/A;  . NM MYOVIEW ([place] HX)  07/2015   No evidence ischemia or infarction. EF 66%. Low risk  . TONSILLECTOMY    . TRANSTHORACIC ECHOCARDIOGRAM  10/2013   Normal LV size and function. EF 55-65%. GR 1 DD. Otherwise normal.   Family History  Problem Relation Age of Onset  . Arthritis Mother   . Stroke Mother   . Hypertension Mother   . Osteoporosis Mother   . Heart disease Father        MI  . Heart attack Father   . Stroke Brother   . Heart attack Sister   . Breast cancer Unknown        first cousin x 2  . Stroke Daughter   . Heart attack Daughter   . Colon cancer Neg Hx    Social History   Socioeconomic History  .  Marital status: Widowed    Spouse name: Not on file  . Number of children: 2  . Years of education: Not on file  . Highest education level: Not on file  Occupational History  . Not on file  Social Needs  . Financial resource strain: Not on file  . Food insecurity:    Worry: Not on file    Inability: Not on file  . Transportation needs:    Medical: Not on file    Non-medical: Not on file  Tobacco Use  . Smoking status: Never Smoker  . Smokeless tobacco: Never Used  Substance and Sexual Activity  . Alcohol use: No    Alcohol/week: 0.0 oz  . Drug use: No  . Sexual activity: Never  Lifestyle  . Physical activity:    Days per week: Not on file    Minutes per session: Not on file  . Stress: Not on file  Relationships  . Social connections:    Talks on phone: Not on file     Gets together: Not on file    Attends religious service: Not on file    Active member of club or organization: Not on file    Attends meetings of clubs or organizations: Not on file    Relationship status: Not on file  Other Topics Concern  . Not on file  Social History Narrative  . Not on file    Outpatient Encounter Medications as of 08/09/2017  Medication Sig  . acetaminophen (TYLENOL) 500 MG tablet Take 500 mg by mouth daily as needed for headache (pain).  Marland Kitchen albuterol (PROVENTIL HFA;VENTOLIN HFA) 108 (90 Base) MCG/ACT inhaler Inhale 2 puffs into the lungs every 6 (six) hours as needed for wheezing or shortness of breath.  Marland Kitchen amiodarone (PACERONE) 200 MG tablet TAKE ONE (1) TABLET EACH DAY  . amLODipine (NORVASC) 2.5 MG tablet TAKE ONE (1) TABLET EACH DAY  . BREO ELLIPTA 100-25 MCG/INH AEPB Inhale 1 puff into the lungs daily.  . cholecalciferol (VITAMIN D) 1000 units tablet Take 1,000 Units by mouth 2 (two) times daily.  . clopidogrel (PLAVIX) 75 MG tablet TAKE ONE (1) TABLET EACH DAY  . cyclobenzaprine (FLEXERIL) 10 MG tablet cyclobenzaprine 10 mg tablet  Take 1 tablet twice a day by oral route.  Marland Kitchen ELIQUIS 5 MG TABS tablet TAKE ONE TABLET TWICE DAILY  . fish oil-omega-3 fatty acids 1000 MG capsule Take 1 g by mouth 2 (two) times daily.   . furosemide (LASIX) 20 MG tablet TAKE ONE (1) TABLET EACH DAY  . Menthol, Topical Analgesic, (BIOFREEZE EX) Apply 1 application topically daily as needed (pain).  . Misc Natural Products (OSTEO BI-FLEX JOINT SHIELD PO) Take 1 tablet by mouth 2 (two) times daily.   . Multiple Vitamin (MULTIVITAMIN WITH MINERALS) TABS tablet Take 0.5 tablets by mouth 2 (two) times daily.  . nitroGLYCERIN (NITROSTAT) 0.4 MG SL tablet Place 1 tablet (0.4 mg total) under the tongue every 5 (five) minutes as needed.  . pantoprazole (PROTONIX) 40 MG tablet TAKE ONE (1) TABLET EACH DAY  . Polyethyl Glycol-Propyl Glycol (SYSTANE OP) Place 1 drop into both eyes daily as needed  (dry eyes).  . potassium chloride (K-DUR) 10 MEQ tablet TAKE ONE (1) TABLET EACH DAY AS NEEDED  . PRESCRIPTION MEDICATION Inhale into the lungs at bedtime. CPAP  . propranolol (INDERAL) 10 MG tablet Take 1 tablet (10 mg total) by mouth 3 (three) times daily.  . ranitidine (ZANTAC) 75 MG tablet Take 75 mg  by mouth 2 (two) times daily.  . rosuvastatin (CRESTOR) 40 MG tablet TAKE ONE (1) TABLET EACH DAY  . [DISCONTINUED] losartan-hydrochlorothiazide (HYZAAR) 100-12.5 MG tablet TAKE ONE (1) TABLET EACH DAY  . [DISCONTINUED] meloxicam (MOBIC) 15 MG tablet Take 15 mg by mouth daily as needed for pain.    No facility-administered encounter medications on file as of 08/09/2017.     Review of Systems  Constitutional: Negative for appetite change and unexpected weight change.  HENT: Negative for congestion and sinus pressure.   Respiratory: Negative for cough and chest tightness.        Breathing stable.    Cardiovascular: Negative for chest pain and palpitations.       Some swelling in her feet.  Better today.    Gastrointestinal: Negative for abdominal pain, diarrhea, nausea and vomiting.       Occasional constipation.   Genitourinary: Negative for difficulty urinating and dysuria.  Musculoskeletal: Negative for joint swelling and myalgias.  Skin: Negative for color change and rash.  Neurological: Negative for dizziness, light-headedness and headaches.  Psychiatric/Behavioral: Negative for agitation and dysphoric mood.       Objective:     Blood pressure rechecked by me:  124/68  Physical Exam  Constitutional: She appears well-developed and well-nourished. No distress.  HENT:  Nose: Nose normal.  Mouth/Throat: Oropharynx is clear and moist.  Neck: Neck supple. No thyromegaly present.  Cardiovascular: Normal rate and regular rhythm.  Pulmonary/Chest: Breath sounds normal. No respiratory distress. She has no wheezes.  Abdominal: Soft. Bowel sounds are normal. There is no tenderness.    Musculoskeletal: She exhibits no tenderness.  Swelling in feet.  Better today.    Lymphadenopathy:    She has no cervical adenopathy.  Skin: No rash noted. No erythema.  Psychiatric: She has a normal mood and affect. Her behavior is normal.    BP (!) 118/58 (BP Location: Left Arm, Patient Position: Sitting, Cuff Size: Normal)   Pulse 64   Temp 98 F (36.7 C) (Oral)   Resp 18   Wt 190 lb (86.2 kg)   LMP 05/01/1972   SpO2 97%   BMI 38.38 kg/m  Wt Readings from Last 3 Encounters:  08/09/17 190 lb (86.2 kg)  05/08/17 190 lb (86.2 kg)  04/05/17 187 lb 2 oz (84.9 kg)     Lab Results  Component Value Date   WBC 7.5 11/11/2016   HGB 13.2 11/11/2016   HCT 38.9 11/11/2016   PLT 227 11/11/2016   GLUCOSE 95 08/07/2017   CHOL 133 08/07/2017   TRIG 76.0 08/07/2017   HDL 69.60 08/07/2017   LDLCALC 48 08/07/2017   ALT 34 08/07/2017   AST 39 (H) 08/07/2017   NA 143 08/07/2017   K 3.7 08/07/2017   CL 106 08/07/2017   CREATININE 1.15 08/07/2017   BUN 16 08/07/2017   CO2 32 08/07/2017   TSH 3.30 08/07/2017   INR 0.99 03/25/2016   HGBA1C 6.4 08/07/2017   MICROALBUR <0.7 10/24/2016    Mm Screening Breast Tomo Bilateral  Result Date: 07/05/2017 CLINICAL DATA:  Screening. EXAM: DIGITAL SCREENING BILATERAL MAMMOGRAM WITH TOMO AND CAD COMPARISON:  Previous exam(s). ACR Breast Density Category c: The breast tissue is heterogeneously dense, which may obscure small masses. FINDINGS: There are no findings suspicious for malignancy. Images were processed with CAD. IMPRESSION: No mammographic evidence of malignancy. A result letter of this screening mammogram will be mailed directly to the patient. RECOMMENDATION: Screening mammogram in one year. (Code:SM-B-01Y) BI-RADS CATEGORY  1: Negative. Electronically Signed   By: Curlene Dolphin M.D.   On: 07/05/2017 15:24       Assessment & Plan:   Problem List Items Addressed This Visit    BMI 38.0-38.9,adult    Diet and exercise.  Follow.         Coronary artery disease involving native coronary artery of native heart with unstable angina pectoris ([place])    Followed by cardiology.  Currently no chest pain.  Continue risk factor modification.        Diabetes mellitus ([place])    Low carb diet and exercise.  Follow met b and a1c.        Relevant Orders   Hemoglobin R0Q   Basic metabolic panel   Microalbumin / creatinine urine ratio   Essential hypertension, benign (Chronic)    Blood pressure under good control.  Continue same medication regimen.  Follow pressures.  Follow metabolic panel.        Relevant Orders   CBC with Differential/Platelet   GERD (gastroesophageal reflux disease)    On protonix.  Controlled.  Follow.        Hypercholesterolemia (Chronic)    On crestor. Low cholesterol diet and exercise.  Follow lipid panel and liver function tests.        Relevant Orders   Hepatic function panel   Lipid panel   Obstructive sleep apnea (Chronic)    CPAP.       Paroxysmal atrial fibrillation ([place])    Followed by cardiology.  On eliquis.  In SR.        Stress    Discussed with her today.  Overall she feels she is handling things relatively well.  Does not feel needs any further intervention.  Follow.            Einar Pheasant, MD

## 2017-08-12 ENCOUNTER — Encounter: Payer: Self-pay | Admitting: Internal Medicine

## 2017-08-12 NOTE — Assessment & Plan Note (Signed)
Blood pressure under good control.  Continue same medication regimen.  Follow pressures.  Follow metabolic panel.   

## 2017-08-12 NOTE — Assessment & Plan Note (Signed)
CPAP.  

## 2017-08-12 NOTE — Assessment & Plan Note (Signed)
Discussed with her today.  Overall she feels she is handling things relatively well.  Does not feel needs any further intervention.  Follow.

## 2017-08-12 NOTE — Assessment & Plan Note (Signed)
On crestor.  Low cholesterol diet and exercise.  Follow lipid panel and liver function tests.   

## 2017-08-12 NOTE — Assessment & Plan Note (Signed)
Diet and exercise.  Follow.  

## 2017-08-12 NOTE — Assessment & Plan Note (Signed)
Low carb diet and exercise.  Follow met b and a1c.   

## 2017-08-12 NOTE — Assessment & Plan Note (Signed)
Followed by cardiology.  Currently no chest pain.  Continue risk factor modification.

## 2017-08-12 NOTE — Assessment & Plan Note (Signed)
On protonix.  Controlled.  Follow.

## 2017-08-12 NOTE — Assessment & Plan Note (Signed)
Followed by cardiology.  On eliquis.  In SR.

## 2017-08-22 ENCOUNTER — Other Ambulatory Visit: Payer: Self-pay | Admitting: Cardiovascular Disease

## 2017-09-06 ENCOUNTER — Encounter: Payer: Self-pay | Admitting: Family Medicine

## 2017-09-06 ENCOUNTER — Ambulatory Visit (INDEPENDENT_AMBULATORY_CARE_PROVIDER_SITE_OTHER): Payer: Medicare Other | Admitting: Family Medicine

## 2017-09-06 ENCOUNTER — Other Ambulatory Visit (INDEPENDENT_AMBULATORY_CARE_PROVIDER_SITE_OTHER): Payer: Medicare Other

## 2017-09-06 ENCOUNTER — Telehealth: Payer: Self-pay | Admitting: *Deleted

## 2017-09-06 VITALS — BP 118/64 | HR 62 | Temp 98.3°F | Resp 15 | Wt 187.5 lb

## 2017-09-06 DIAGNOSIS — J209 Acute bronchitis, unspecified: Secondary | ICD-10-CM

## 2017-09-06 DIAGNOSIS — M4696 Unspecified inflammatory spondylopathy, lumbar region: Secondary | ICD-10-CM | POA: Diagnosis not present

## 2017-09-06 DIAGNOSIS — I25110 Atherosclerotic heart disease of native coronary artery with unstable angina pectoris: Secondary | ICD-10-CM | POA: Diagnosis not present

## 2017-09-06 DIAGNOSIS — I10 Essential (primary) hypertension: Secondary | ICD-10-CM

## 2017-09-06 DIAGNOSIS — E7800 Pure hypercholesterolemia, unspecified: Secondary | ICD-10-CM

## 2017-09-06 DIAGNOSIS — E1159 Type 2 diabetes mellitus with other circulatory complications: Secondary | ICD-10-CM | POA: Diagnosis not present

## 2017-09-06 DIAGNOSIS — M5136 Other intervertebral disc degeneration, lumbar region: Secondary | ICD-10-CM | POA: Diagnosis not present

## 2017-09-06 DIAGNOSIS — M545 Low back pain: Secondary | ICD-10-CM | POA: Diagnosis not present

## 2017-09-06 LAB — HEPATIC FUNCTION PANEL
ALBUMIN: 3.7 g/dL (ref 3.5–5.2)
ALT: 31 U/L (ref 0–35)
AST: 35 U/L (ref 0–37)
Alkaline Phosphatase: 59 U/L (ref 39–117)
BILIRUBIN DIRECT: 0.1 mg/dL (ref 0.0–0.3)
BILIRUBIN TOTAL: 0.5 mg/dL (ref 0.2–1.2)
Total Protein: 6.7 g/dL (ref 6.0–8.3)

## 2017-09-06 LAB — BASIC METABOLIC PANEL
BUN: 19 mg/dL (ref 6–23)
CHLORIDE: 100 meq/L (ref 96–112)
CO2: 34 meq/L — AB (ref 19–32)
CREATININE: 1.21 mg/dL — AB (ref 0.40–1.20)
Calcium: 9.5 mg/dL (ref 8.4–10.5)
GFR: 45.64 mL/min — ABNORMAL LOW (ref >60.00)
GLUCOSE: 111 mg/dL — AB (ref 70–99)
Potassium: 4.1 mEq/L (ref 3.5–5.1)
Sodium: 140 mEq/L (ref 135–145)

## 2017-09-06 LAB — MICROALBUMIN / CREATININE URINE RATIO
CREATININE, U: 75.8 mg/dL
Microalb Creat Ratio: 3.4 mg/g (ref 0.0–30.0)
Microalb, Ur: 2.5 mg/dL — ABNORMAL HIGH (ref 0.0–1.9)

## 2017-09-06 MED ORDER — PREDNISONE 10 MG PO TABS: ORAL_TABLET | ORAL | 0 refills | Status: DC

## 2017-09-06 MED ORDER — DOXYCYCLINE HYCLATE 100 MG PO TABS: 100.0000 mg | ORAL_TABLET | Freq: Two times a day (BID) | ORAL | 0 refills | Status: DC

## 2017-09-06 NOTE — Addendum Note (Signed)
Addended by: Leeanne Rio on: 09/06/2017 10:08 AM   Modules accepted: Orders

## 2017-09-06 NOTE — Telephone Encounter (Signed)
Please verify which lab patient should have had drawn today.

## 2017-09-06 NOTE — Telephone Encounter (Signed)
Just draw the met b and liver ordered.  Thanks.    Dr Nicki Reaper

## 2017-09-06 NOTE — Patient Instructions (Signed)
Please take prednisone with food as directed and follow up with your pulmonologist as scheduled.  If symptoms do not improve with treatment, worsen, or new symptoms develop, please follow up with Dr. Nicki Reaper or your pulmonologist for further evaluation and treatment.   Acute Bronchitis, Adult Acute bronchitis is when air tubes (bronchi) in the lungs suddenly get swollen. The condition can make it hard to breathe. It can also cause these symptoms:  A cough.  Coughing up clear, yellow, or green mucus.  Wheezing.  Chest congestion.  Shortness of breath.  A fever.  Body aches.  Chills.  A sore throat.  Follow these instructions at home: Medicines  Take over-the-counter and prescription medicines only as told by your doctor.  If you were prescribed an antibiotic medicine, take it as told by your doctor. Do not stop taking the antibiotic even if you start to feel better. General instructions  Rest.  Drink enough fluids to keep your pee (urine) clear or pale yellow.  Avoid smoking and secondhand smoke. If you smoke and you need help quitting, ask your doctor. Quitting will help your lungs heal faster.  Use an inhaler, cool mist vaporizer, or humidifier as told by your doctor.  Keep all follow-up visits as told by your doctor. This is important. How is this prevented? To lower your risk of getting this condition again:  Wash your hands often with soap and water. If you cannot use soap and water, use hand sanitizer.  Avoid contact with people who have cold symptoms.  Try not to touch your hands to your mouth, nose, or eyes.  Make sure to get the flu shot every year.  Contact a doctor if:  Your symptoms do not get better in 2 weeks. Get help right away if:  You cough up blood.  You have chest pain.  You have very bad shortness of breath.  You become dehydrated.  You faint (pass out) or keep feeling like you are going to pass out.  You keep throwing up  (vomiting).  You have a very bad headache.  Your fever or chills gets worse. This information is not intended to replace advice given to you by your health care provider. Make sure you discuss any questions you have with your health care provider. Document Released: 10/04/2007 Document Revised: 11/24/2015 Document Reviewed: 10/06/2015 Elsevier Interactive Patient Education  [employer].

## 2017-09-06 NOTE — Addendum Note (Signed)
Addended by: Leeanne Rio on: 09/06/2017 11:30 AM   Modules accepted: Orders

## 2017-09-06 NOTE — Telephone Encounter (Signed)
thanks

## 2017-09-06 NOTE — Progress Notes (Signed)
Patient ID: [name], female   DOB: [birth year], 77 y.o.   MRN: [id number]  PCP: Einar Pheasant, MD  Subjective:  April [name] is a 77 y.o. year old very pleasant female patient who presents with  symptoms including nasal congestion and cough that is minimally productive of yellow sputum, chills - does have wheeze as well intermittently -started: 4 days ago, symptoms are improving -previous treatments: flonase has provided limited benefit.  She is adherent with Breo daily.  -sick contacts/travel/risks: denies flu exposure.  She is followed by by pulmonology for reactive airway disease and uses albuterol on a rare basis and has not used it during this episode of symptoms She uses CPAP and also sees ENT for RAD   She reports history of bronchitis that occurs "every spring"     ROS-denies fever, NVD, tooth pain. Denies significant shortness of breath/no different than baseline   Pertinent Past Medical History-  Patient Active Problem List   Diagnosis Date Noted  . Paroxysmal atrial fibrillation ([place]) 03/27/2016  . ST elevation myocardial infarction (STEMI) of inferolateral wall, initial episode of care ([place]) 03/25/2016  . CAD S/P PCI pRCA Promus DES 3.5 x 16 (4.1 mm), ostRPDA Promus DES 2.5 x 12 (2.7 mm) 03/25/2016  . Right knee pain 11/13/2015  . Coronary artery disease involving native coronary artery of native heart with unstable angina pectoris ([place]) 06/01/2015  . Diabetes mellitus ([place]) 05/16/2015  . Gastritis 04/01/2015  . Chest pain 04/01/2015  . Hiatal hernia 08/25/2014  . DOE (dyspnea on exertion) 07/15/2014  . Congestion of throat 07/06/2014  . Health care maintenance 07/06/2014  . Fibrocystic breast disease 07/06/2014  . BMI 38.0-38.9,adult 04/19/2014  . Stress 02/16/2013  . Rib pain on right side 02/16/2013  . SOB (shortness of breath) 10/13/2012  . GERD (gastroesophageal reflux disease) 10/13/2012  . Essential hypertension, benign 08/18/2012  .  Hypercholesterolemia 08/18/2012  . History of colonic polyps 08/18/2012  . Obstructive sleep apnea 08/18/2012  . Osteoporosis 08/18/2012    Medications- reviewed  Current Outpatient Medications  Medication Sig Dispense Refill  . acetaminophen (TYLENOL) 500 MG tablet Take 500 mg by mouth daily as needed for headache (pain).    Marland Kitchen albuterol (PROVENTIL HFA;VENTOLIN HFA) 108 (90 Base) MCG/ACT inhaler Inhale 2 puffs into the lungs every 6 (six) hours as needed for wheezing or shortness of breath. 1 Inhaler 6  . amiodarone (PACERONE) 200 MG tablet TAKE ONE (1) TABLET EACH DAY 30 tablet 6  . amLODipine (NORVASC) 2.5 MG tablet TAKE ONE (1) TABLET EACH DAY 30 tablet 6  . BREO ELLIPTA 100-25 MCG/INH AEPB Inhale 1 puff into the lungs daily. 60 each 4  . cholecalciferol (VITAMIN D) 1000 units tablet Take 1,000 Units by mouth 2 (two) times daily.    . clopidogrel (PLAVIX) 75 MG tablet TAKE ONE (1) TABLET EACH DAY 30 tablet 6  . clopidogrel (PLAVIX) 75 MG tablet TAKE ONE (1) TABLET EACH DAY 30 tablet 4  . cyclobenzaprine (FLEXERIL) 10 MG tablet cyclobenzaprine 10 mg tablet  Take 1 tablet twice a day by oral route.    Marland Kitchen ELIQUIS 5 MG TABS tablet TAKE ONE TABLET TWICE DAILY 60 tablet 6  . fish oil-omega-3 fatty acids 1000 MG capsule Take 1 g by mouth 2 (two) times daily.     . furosemide (LASIX) 20 MG tablet TAKE ONE (1) TABLET EACH DAY 90 tablet 3  . losartan-hydrochlorothiazide (HYZAAR) 100-12.5 MG tablet Take 1 tablet by mouth daily. 90 tablet 1  .  Menthol, Topical Analgesic, (BIOFREEZE EX) Apply 1 application topically daily as needed (pain).    . Misc Natural Products (OSTEO BI-FLEX JOINT SHIELD PO) Take 1 tablet by mouth 2 (two) times daily.     . Multiple Vitamin (MULTIVITAMIN WITH MINERALS) TABS tablet Take 0.5 tablets by mouth 2 (two) times daily.    . nitroGLYCERIN (NITROSTAT) 0.4 MG SL tablet Place 1 tablet (0.4 mg total) under the tongue every 5 (five) minutes as needed. 25 tablet 3  .  pantoprazole (PROTONIX) 40 MG tablet TAKE ONE (1) TABLET EACH DAY 30 tablet 6  . Polyethyl Glycol-Propyl Glycol (SYSTANE OP) Place 1 drop into both eyes daily as needed (dry eyes).    . potassium chloride (K-DUR) 10 MEQ tablet TAKE ONE (1) TABLET EACH DAY AS NEEDED 90 tablet 3  . PRESCRIPTION MEDICATION Inhale into the lungs at bedtime. CPAP    . propranolol (INDERAL) 10 MG tablet Take 1 tablet (10 mg total) by mouth 3 (three) times daily. 90 tablet 3  . ranitidine (ZANTAC) 75 MG tablet Take 75 mg by mouth 2 (two) times daily.    . rosuvastatin (CRESTOR) 40 MG tablet TAKE ONE (1) TABLET EACH DAY 30 tablet 6   No current facility-administered medications for this visit.     Objective: BP 118/64 (BP Location: Left Arm, Patient Position: Sitting, Cuff Size: Normal)   Pulse 62   Temp 98.3 F (36.8 C) (Oral)   Resp 15   Wt 187 lb 8 oz (85 kg)   LMP 05/01/1972   SpO2 98%   BMI 37.87 kg/m  Gen: NAD, resting comfortably HEENT: Turbinates mildly erythematous, TMs normal bilaterally, oropharynx clear and moist with no tonsilar exudate or edema, no sinus tenderness CV: RRR no murmurs rubs or gallops Lungs: CTAB no crackles, wheeze, rhonchi  Ext: no edema Skin: warm, dry, no rash  Assessment/Plan: 1. Acute bronchitis, unspecified organism Patient's symptoms most consistent with bronchitis. Discussed likely viral nature and less likely bacterial cause. Benign lung exam and stable vital signs make pneumonia unlikely. Discussed possible treatment options and a prednisone taper was provided.. Also provided with a prescription for doxycycline to take if improvement does not continue to improve in the next 2-3 days. Advised that if her symptoms were to worsen she should let us know. She has albuterol if needed and we reviewed proper use of rescue versus maintenance inhalers. She is provided return precautions.   - predniSONE (DELTASONE) 10 MG tablet; Take 4 tablets once daily for 2 days, 3 tabs  daily for 2 days, 2 tabs daily for 2 days, 1 tab daily for 2 days.  Dispense: 20 tablet; Refill: 0 - doxycycline (VIBRA-TABS) 100 MG tablet; Take 1 tablet (100 mg total) by mouth 2 (two) times daily.  Dispense: 20 tablet; Refill: 0  We discussed that we did not find any infection that had higher probability of being bacterial such as pneumonia, strep throat, ear infection, bacterial sinusitis. We discussed signs that bacterial infection may have developed particularly fever or shortness of breath and reasons for follow up (symptoms worsen, last past expected time frame, new concerns arise).  Patient is contagious and advised good handwashing and consideration of mask If going to be in public places.     Laurita Quint, FNP

## 2017-09-07 ENCOUNTER — Other Ambulatory Visit: Payer: Self-pay | Admitting: Internal Medicine

## 2017-09-07 DIAGNOSIS — R7989 Other specified abnormal findings of blood chemistry: Secondary | ICD-10-CM

## 2017-09-07 NOTE — Progress Notes (Signed)
Order placed for f/u met b.   

## 2017-09-11 ENCOUNTER — Other Ambulatory Visit: Payer: Self-pay | Admitting: Cardiovascular Disease

## 2017-09-11 NOTE — Telephone Encounter (Signed)
Please review for refill. Thanks!  

## 2017-09-25 ENCOUNTER — Ambulatory Visit: Payer: Medicare Other

## 2017-10-01 DIAGNOSIS — M545 Low back pain: Secondary | ICD-10-CM | POA: Diagnosis not present

## 2017-10-02 ENCOUNTER — Other Ambulatory Visit (INDEPENDENT_AMBULATORY_CARE_PROVIDER_SITE_OTHER): Payer: Medicare Other

## 2017-10-02 DIAGNOSIS — R7989 Other specified abnormal findings of blood chemistry: Secondary | ICD-10-CM | POA: Diagnosis not present

## 2017-10-02 LAB — BASIC METABOLIC PANEL
BUN: 20 mg/dL (ref 6–23)
CO2: 32 mEq/L (ref 19–32)
Calcium: 9.3 mg/dL (ref 8.4–10.5)
Chloride: 101 mEq/L (ref 96–112)
Creatinine, Ser: 1.09 mg/dL (ref 0.40–1.20)
GFR: 51.47 mL/min — AB (ref >60.00)
Glucose, Bld: 154 mg/dL — ABNORMAL HIGH (ref 70–99)
POTASSIUM: 3.8 meq/L (ref 3.5–5.1)
SODIUM: 141 meq/L (ref 135–145)

## 2017-10-05 ENCOUNTER — Telehealth: Payer: Self-pay | Admitting: Cardiovascular Disease

## 2017-10-05 DIAGNOSIS — M5136 Other intervertebral disc degeneration, lumbar region: Secondary | ICD-10-CM | POA: Diagnosis not present

## 2017-10-05 NOTE — Telephone Encounter (Signed)
Patient last seen by Dr. Rockey Situ 11/2016- she is currently on eliquis for PAF.   To Dr. Rockey Situ to review.

## 2017-10-05 NOTE — Telephone Encounter (Signed)
° °  [facility] Pre-operative Risk Assessment    Request for surgical clearance:  1. What type of surgery is being performed? Epidural steroid inj   2. When is this surgery scheduled? tbd   3. What type of clearance is required (medical clearance vs. Pharmacy clearance to hold med vs. Both)? Both  4. Are there any medications that need to be held prior to surgery and how long? Please advise  5. Practice name and name of physician performing surgery?  [facility] - Dr. Aggie Cosier   6. What is your office phone number [phone]   7.   What is your office fax number [phone]  8.   Anesthesia type (None, local, MAC, general) ? NA    Clarisse Gouge 10/05/2017, 12:53 PM  _________________________________________________________________   (provider comments below)

## 2017-10-08 ENCOUNTER — Ambulatory Visit: Payer: Self-pay

## 2017-10-08 NOTE — Telephone Encounter (Signed)
Acceptable risk to stop both the Plavix 5 days prior to the procedure and eliquis 2 days prior to the procedure Following the procedure would not restart the Plavix Would restart eliquis 5 twice a day as she is currently taking

## 2017-10-08 NOTE — Telephone Encounter (Signed)
Pt. Is asking if it is ok to take Tramadol and/or Tylenol for pain since kidney function "is better". Please advise pt.

## 2017-10-08 NOTE — Telephone Encounter (Signed)
Patient is wanting to know if she can start using her Tramadol or Tylenol PRN for pain. She was advised to stay off of them d/t kidney function but when she got her most recent results, kidney function had improved. Advised that you were not in the office today but I would send message and get back in touch with her once you have returned. No acute symptoms noted at this time

## 2017-10-09 ENCOUNTER — Telehealth: Payer: Self-pay | Admitting: Cardiovascular Disease

## 2017-10-09 NOTE — Telephone Encounter (Signed)
Dr Rockey Situ had already cleared patient with different parameters for patient's plavix and Eliquis. Received this request for blood thinners: 4. Are there any medications that need to be held prior to surgery and how long? Plavix  Hold for 7 days and resume 24 hours after Eluquis hold for 3 day and resume 24 hours after   Routing to Dr Rockey Situ to advise on these new parameters.

## 2017-10-09 NOTE — Telephone Encounter (Signed)
Patient aware.

## 2017-10-09 NOTE — Telephone Encounter (Signed)
Ok to take tylenol.  Would hold on tramadol.

## 2017-10-09 NOTE — Telephone Encounter (Signed)
° °  [facility] Pre-operative Risk Assessment    Request for surgical clearance:  1. What type of surgery is being performed? Lumbar Epidural Steroid Injection   2. When is this surgery scheduled? Not listed   3. What type of clearance is required (medical clearance vs. Pharmacy clearance to hold med vs. Both)? Pharmacy   4. Are there any medications that need to be held prior to surgery and how long? Plavix  Hold for 7 days and resume 24 hours after Eluquis hold for 3 day and resume 24 hours after   5. Practice name and name of physician performing surgery? [facility]   6. What is your office phone number [phone]   7.   What is your office fax number [phone]  8.   Anesthesia type (None, local, MAC, general) ? Not listed

## 2017-10-09 NOTE — Telephone Encounter (Signed)
Clearance faxed to [facility] via Epic.  Called patient. She verbalized understanding to stop plavix 5 days prior and eliquis 2 days prior to procedure, then to not restart plavix but to restart eliquis after the procedure. She wrote these instructions down and read them back to me correctly.

## 2017-10-11 NOTE — Telephone Encounter (Signed)
Okay to hold medications below with parameters as detailed When she is not on Plavix would take aspirin 81 mg daily

## 2017-10-12 NOTE — Telephone Encounter (Signed)
Clearance faxed to number provided.

## 2017-10-17 DIAGNOSIS — S81801A Unspecified open wound, right lower leg, initial encounter: Secondary | ICD-10-CM | POA: Diagnosis not present

## 2017-10-24 DIAGNOSIS — M47817 Spondylosis without myelopathy or radiculopathy, lumbosacral region: Secondary | ICD-10-CM | POA: Diagnosis not present

## 2017-11-08 DIAGNOSIS — M5136 Other intervertebral disc degeneration, lumbar region: Secondary | ICD-10-CM | POA: Diagnosis not present

## 2017-11-08 DIAGNOSIS — M4696 Unspecified inflammatory spondylopathy, lumbar region: Secondary | ICD-10-CM | POA: Diagnosis not present

## 2017-11-13 ENCOUNTER — Other Ambulatory Visit: Payer: Self-pay | Admitting: Cardiovascular Disease

## 2017-11-22 ENCOUNTER — Encounter: Payer: Self-pay | Admitting: Internal Medicine

## 2017-11-22 ENCOUNTER — Ambulatory Visit (INDEPENDENT_AMBULATORY_CARE_PROVIDER_SITE_OTHER): Payer: Medicare Other | Admitting: Internal Medicine

## 2017-11-22 VITALS — BP 138/80 | HR 64 | Ht 59.0 in | Wt 188.0 lb

## 2017-11-22 DIAGNOSIS — G4733 Obstructive sleep apnea (adult) (pediatric): Secondary | ICD-10-CM | POA: Diagnosis not present

## 2017-11-22 DIAGNOSIS — I25110 Atherosclerotic heart disease of native coronary artery with unstable angina pectoris: Secondary | ICD-10-CM | POA: Diagnosis not present

## 2017-11-22 NOTE — Progress Notes (Signed)
MRN# [id number] GALIT URICH [birth year]   CC: Follow up OSA and SOB  Brief History: HPI 06/2014  ROV 07/2014 Plan - cont with cpap, severe hiatal hernia, referral to surgery for large hiatal hernia, cont with exercise as tolerated. Has normal pfts (no obs process).   ROV 10/2014: Plan: Normal PFTS, 68mwt with only 372 feet. Most likely deconditioning.  ROV 04/2015 Plan  - continue with CPAP - exercise as tolerated.  - ENT F\U - Cardiology Referral - Breo 2 week trial - patient to call office for rx if needed.   OV 7.25.19  Follow up OSA CPAP 11 cm h20 with AHI 3.5  90% compliance   Patient resents today for follow-up of her chronic dyspnea on exertion, and chronic nonproductive cough Improved with BREO but sometimes wheezing with exertion Have explained using ALBUTEROL as needed  .Patient is on Breo, and states that she feels that this is definitely helping her shortness of breath with exertion and cough. She is also seeing cardiology,Dr. Rockey Situ, +coronary artery disease  Overall doing well with CPAP therapy and breathing No signs of infection at this time She uses and benefits from CPAP therapy and doing well   Sees ENT for tense vocal cords Also had seen speech therapy Will see neurosurgery for chronic back pain   Medication:    Review of Systems: Gen:  Denies  fever, sweats, chills HEENT: Denies blurred vision, double vision, ear pain, eye pain, hearing loss, nose bleeds, sore throat Cvc:  No dizziness, mild intermittent sharp left chest pain at rest.  Resp:   Admits to:sob, doe, dry cough,  (baseline) Gi: Denies swallowing difficulty, stomach pain, nausea or vomiting, diarrhea, constipation, bowel incontinence Gu:  Denies bladder incontinence, burning urine Ext:   Leg swelling Skin: No skin rash, easy bruising or bleeding or hives Endoc:  No polyuria, polydipsia , polyphagia or weight change Other:  All other systems negative  Allergies:  Penicillins  and Sulfa antibiotics  Physical Examination:    BP 138/80 (BP Location: Left Arm, Cuff Size: Normal)   Pulse 64   Ht 4\' 11"  (1.499 m)   Wt 188 lb (85.3 kg)   LMP 05/01/1972   SpO2 98%   BMI 37.97 kg/m   VS: Ht 4\' 11"  (1.499 m)   Wt 188 lb (85.3 kg)   LMP 05/01/1972   BMI 37.97 kg/m   General Appearance: No distress  HEENT: PERRLA, no ptosis, no other lesions noticed Pulmonary:normal breath sounds., diaphragmatic excursion normal.No wheezing, No rales   Cardiovascular:  Normal S1,S2.  No m/r/g.     Abdomen:Exam: Benign, Soft, non-tender, No masses  Skin:   warm, no rashes, no ecchymosis  Extremities: normal, no cyanosis, clubbing, warm with normal capillary refill.  1+ bilateral lower extremity edema pitting  Assessment and Plan:   77 year old female seen in follow-up for dyspnea on exertion/SOB with CAD and deconditioned state with OSA  DOE (dyspnea on exertion)/SOB-likely related to underlying reactive airways disease and deconditioned state I believe that her dyspnea on exertion is multifactorial in nature due to factors such as: Deconditioning, obstructive sleep apnea, obesity, hiatal hernia, coronary artery disease, cardiac megaly, nasal congestion/allergies She now with improvement since being on Breo and adjusting her PPI   albuterol as needed and I have advised to rinse mouth after every use of BREO  Deconditioned state-recommend exercise as tolerated  CAD-needs to follow up with cardiology  Nose bleeds/pink tingeg sputum -related to rhinnits   OSA with CPAP therapy  AHI now 3.5    Patient satisfied with Plan of action and management. All questions answered Follow up 1 year  Corrin Parker, M.D.  [facility]  Medical Director [place] Director [facility] Cardio-Pulmonary Department

## 2017-11-22 NOTE — Patient Instructions (Signed)
Continue CPAP prescribed

## 2017-12-06 ENCOUNTER — Telehealth: Payer: Self-pay | Admitting: Radiology

## 2017-12-06 ENCOUNTER — Other Ambulatory Visit: Payer: Self-pay | Admitting: Internal Medicine

## 2017-12-06 DIAGNOSIS — E7800 Pure hypercholesterolemia, unspecified: Secondary | ICD-10-CM

## 2017-12-06 DIAGNOSIS — I10 Essential (primary) hypertension: Secondary | ICD-10-CM

## 2017-12-06 DIAGNOSIS — E1159 Type 2 diabetes mellitus with other circulatory complications: Secondary | ICD-10-CM

## 2017-12-06 NOTE — Progress Notes (Signed)
Orders placed for labs

## 2017-12-06 NOTE — Telephone Encounter (Signed)
Pt coming in for labs tomorrow, please place future orders. Thank you.  

## 2017-12-06 NOTE — Telephone Encounter (Signed)
Orders placed for labs

## 2017-12-07 ENCOUNTER — Other Ambulatory Visit (INDEPENDENT_AMBULATORY_CARE_PROVIDER_SITE_OTHER): Payer: Medicare Other

## 2017-12-07 DIAGNOSIS — E1159 Type 2 diabetes mellitus with other circulatory complications: Secondary | ICD-10-CM | POA: Diagnosis not present

## 2017-12-07 DIAGNOSIS — I10 Essential (primary) hypertension: Secondary | ICD-10-CM

## 2017-12-07 DIAGNOSIS — E7800 Pure hypercholesterolemia, unspecified: Secondary | ICD-10-CM | POA: Diagnosis not present

## 2017-12-07 LAB — BASIC METABOLIC PANEL
BUN: 20 mg/dL (ref 6–23)
CO2: 34 mEq/L — ABNORMAL HIGH (ref 19–32)
CREATININE: 1.13 mg/dL (ref 0.40–1.20)
Calcium: 9.1 mg/dL (ref 8.4–10.5)
Chloride: 104 mEq/L (ref 96–112)
GFR: 49.35 mL/min — ABNORMAL LOW (ref >60.00)
Glucose, Bld: 108 mg/dL — ABNORMAL HIGH (ref 70–99)
Potassium: 3.9 mEq/L (ref 3.5–5.1)
Sodium: 145 mEq/L (ref 135–145)

## 2017-12-07 LAB — CBC WITH DIFFERENTIAL/PLATELET
Basophils Absolute: 0 10*3/uL (ref 0.0–0.1)
Basophils Relative: 0.3 % (ref 0.0–3.0)
EOS ABS: 0.1 10*3/uL (ref 0.0–0.7)
Eosinophils Relative: 2.2 % (ref 0.0–5.0)
HEMATOCRIT: 38.3 % (ref 36.0–46.0)
Hemoglobin: 12.8 g/dL (ref 12.0–15.0)
LYMPHS PCT: 36.4 % (ref 12.0–46.0)
Lymphs Abs: 2.2 10*3/uL (ref 0.7–4.0)
MCHC: 33.3 g/dL (ref 30.0–36.0)
MCV: 94.1 fl (ref 78.0–100.0)
Monocytes Absolute: 0.7 10*3/uL (ref 0.1–1.0)
Monocytes Relative: 10.8 % (ref 3.0–12.0)
NEUTROS ABS: 3.1 10*3/uL (ref 1.4–7.7)
Neutrophils Relative %: 50.3 % (ref 43.0–77.0)
PLATELETS: 219 10*3/uL (ref 150.0–400.0)
RBC: 4.07 Mil/uL (ref 3.87–5.11)
RDW: 13.2 % (ref 11.5–15.5)
WBC: 6.1 10*3/uL (ref 4.0–10.5)

## 2017-12-07 LAB — LIPID PANEL
CHOL/HDL RATIO: 2
Cholesterol: 149 mg/dL (ref 0–200)
HDL: 68.4 mg/dL (ref >39.00)
LDL CALC: 65 mg/dL (ref 0–99)
NONHDL: 80.49
Triglycerides: 78 mg/dL (ref 0.0–149.0)
VLDL: 15.6 mg/dL (ref 0.0–40.0)

## 2017-12-07 LAB — HEPATIC FUNCTION PANEL
ALBUMIN: 3.7 g/dL (ref 3.5–5.2)
ALT: 37 U/L — ABNORMAL HIGH (ref 0–35)
AST: 33 U/L (ref 0–37)
Alkaline Phosphatase: 55 U/L (ref 39–117)
Bilirubin, Direct: 0.1 mg/dL (ref 0.0–0.3)
Total Bilirubin: 0.6 mg/dL (ref 0.2–1.2)
Total Protein: 6 g/dL (ref 6.0–8.3)

## 2017-12-07 LAB — HEMOGLOBIN A1C: HEMOGLOBIN A1C: 6.4 % (ref 4.6–6.5)

## 2017-12-10 ENCOUNTER — Ambulatory Visit: Payer: Medicare Other | Admitting: Cardiovascular Disease

## 2017-12-11 ENCOUNTER — Other Ambulatory Visit: Payer: Medicare Other

## 2017-12-13 ENCOUNTER — Encounter: Payer: Self-pay | Admitting: Internal Medicine

## 2017-12-13 ENCOUNTER — Ambulatory Visit (INDEPENDENT_AMBULATORY_CARE_PROVIDER_SITE_OTHER): Payer: Medicare Other | Admitting: Internal Medicine

## 2017-12-13 DIAGNOSIS — E7800 Pure hypercholesterolemia, unspecified: Secondary | ICD-10-CM

## 2017-12-13 DIAGNOSIS — E1159 Type 2 diabetes mellitus with other circulatory complications: Secondary | ICD-10-CM

## 2017-12-13 DIAGNOSIS — K219 Gastro-esophageal reflux disease without esophagitis: Secondary | ICD-10-CM

## 2017-12-13 DIAGNOSIS — I25110 Atherosclerotic heart disease of native coronary artery with unstable angina pectoris: Secondary | ICD-10-CM

## 2017-12-13 DIAGNOSIS — I480 Paroxysmal atrial fibrillation: Secondary | ICD-10-CM

## 2017-12-13 DIAGNOSIS — F439 Reaction to severe stress, unspecified: Secondary | ICD-10-CM

## 2017-12-13 DIAGNOSIS — I10 Essential (primary) hypertension: Secondary | ICD-10-CM | POA: Diagnosis not present

## 2017-12-13 DIAGNOSIS — G4733 Obstructive sleep apnea (adult) (pediatric): Secondary | ICD-10-CM | POA: Diagnosis not present

## 2017-12-13 NOTE — Progress Notes (Signed)
Patient ID: April [name], female   DOB: 1940-05-22, 77 y.o.   MRN: [id number]   Subjective:    Patient ID: April [name], female    DOB: 07/31/1940, 77 y.o.   MRN: [id number]  HPI  Patient here for a scheduled follow up.  Saw Dr Mortimer Fries 11/22/17 for f/u sleep apnea.  Recommended continuing cpap.  On breo.  Has albuterol if needed.  No changes made.  Breathing overall stable.  Has seen ENT and speech therapy for tense vocal cords. Overall she feels things are going relatively well.  Persistent back pain.  Scheduled to see Dr Arnoldo Morale next Friday.  No acid reflux.  Eating.  No abdominal pain.  Bowels moving.  Handling stress relatively well.  Does not feel needs any further intervention.  Follow.     Past Medical History:  Diagnosis Date  . Arrhythmia    history of an irregular heart beat  . CAD S/P PCI pRCA Promus DES 3.5 x 16 (4.1 mm), ostRPDA Promus DES 2.5 x 12 (2.7 mm) 03/25/2016  . Diverticulitis   . Endometriosis   . Heart murmur    no significant valvular lesion noted on Echo  . Hiatal hernia   . History of colon polyps 08/1994  . Hypercholesterolemia   . Hypertension   . Migraines    history of migraines  . Osteoporosis   . Rheumatic fever   . Sleep apnea    CPAP  . ST elevation myocardial infarction (STEMI) of inferolateral wall, initial episode of care ([place]) 03/25/2016  . Urine incontinence    Past Surgical History:  Procedure Laterality Date  . ABDOMINAL HYSTERECTOMY     ovaries not removed  . APPENDECTOMY     was removed during hysterectomy  . Breast biopsies     x2  . BREAST BIOPSY Bilateral "years ago"  . BREAST SURGERY Bilateral   . CARDIAC CATHETERIZATION  2011   moderate 40% RCA disease  . CARDIAC CATHETERIZATION  01/2010   Dr. Gollan@[facility] : Only noted 40% RCA  . CARDIAC CATHETERIZATION N/A 03/25/2016   Procedure: Left Heart Cath and Coronary Angiography;  Surgeon: 04/07/2016, MD;  Location: [place] [facility];  Service: Cardiovascular;  Laterality:  N/A;  . CARDIAC CATHETERIZATION N/A 03/25/2016   Procedure: Coronary Stent Intervention;  Surgeon: 04/07/2016, MD;  Location: [place] [facility];  Service: Cardiovascular;  Laterality: N/A;  . COLONOSCOPY  2013  . ESOPHAGOGASTRODUODENOSCOPY (EGD) WITH PROPOFOL N/A 03/17/2015   Procedure: ESOPHAGOGASTRODUODENOSCOPY (EGD) WITH PROPOFOL;  Surgeon: 03/30/2015, MD;  Location: [facility];  Service: Gastroenterology;  Laterality: N/A;  . NM MYOVIEW ([place] [facility])  07/2015   No evidence ischemia or infarction. EF 66%. Low risk  . TONSILLECTOMY    . TRANSTHORACIC ECHOCARDIOGRAM  10/2013   Normal LV size and function. EF 55-65%. GR 1 DD. Otherwise normal.   Family History  Problem Relation Age of Onset  . Arthritis Mother   . Stroke Mother   . Hypertension Mother   . Osteoporosis Mother   . Heart disease Father        MI  . Heart attack Father   . Stroke Brother   . Heart attack Sister   . Breast cancer Unknown        first cousin x 2  . Stroke Daughter   . Heart attack Daughter   . Colon cancer Neg Hx    Social History   Socioeconomic History  . Marital status: Widowed  Spouse name: Not on file  . Number of children: 2  . Years of education: Not on file  . Highest education level: Not on file  Occupational History  . Not on file  Social Needs  . Financial resource strain: Not on file  . Food insecurity:    Worry: Not on file    Inability: Not on file  . Transportation needs:    Medical: Not on file    Non-medical: Not on file  Tobacco Use  . Smoking status: Never Smoker  . Smokeless tobacco: Never Used  Substance and Sexual Activity  . Alcohol use: No    Alcohol/week: 0.0 standard drinks  . Drug use: No  . Sexual activity: Never  Lifestyle  . Physical activity:    Days per week: Not on file    Minutes per session: Not on file  . Stress: Not on file  Relationships  . Social connections:    Talks on phone: Not on file    Gets together: Not on file      Attends religious service: Not on file    Active member of club or organization: Not on file    Attends meetings of clubs or organizations: Not on file    Relationship status: Not on file  Other Topics Concern  . Not on file  Social History Narrative  . Not on file    Outpatient Encounter Medications as of 12/13/2017  Medication Sig  . acetaminophen (TYLENOL) 500 MG tablet Take 500 mg by mouth daily as needed for headache (pain).  Marland Kitchen albuterol (PROVENTIL HFA;VENTOLIN HFA) 108 (90 Base) MCG/ACT inhaler Inhale 2 puffs into the lungs every 6 (six) hours as needed for wheezing or shortness of breath.  Marland Kitchen amiodarone (PACERONE) 200 MG tablet TAKE ONE (1) TABLET EACH DAY  . amLODipine (NORVASC) 2.5 MG tablet TAKE ONE (1) TABLET EACH DAY  . azelastine (ASTELIN) 0.1 % nasal spray Place 2 sprays into both nostrils daily as needed for rhinitis. Use in each nostril as directed  . BREO ELLIPTA 100-25 MCG/INH AEPB Inhale 1 puff into the lungs daily.  . cholecalciferol (VITAMIN D) 1000 units tablet Take 1,000 Units by mouth 2 (two) times daily.  Marland Kitchen doxycycline (VIBRA-TABS) 100 MG tablet Take 1 tablet (100 mg total) by mouth 2 (two) times daily.  Marland Kitchen ELIQUIS 5 MG TABS tablet TAKE ONE TABLET TWICE DAILY  . fish oil-omega-3 fatty acids 1000 MG capsule Take 1 g by mouth 2 (two) times daily.   . furosemide (LASIX) 20 MG tablet TAKE ONE (1) TABLET EACH DAY  . losartan-hydrochlorothiazide (HYZAAR) 100-12.5 MG tablet Take 1 tablet by mouth daily.  . Menthol, Topical Analgesic, (BIOFREEZE EX) Apply 1 application topically daily as needed (pain).  . Misc Natural Products (OSTEO BI-FLEX JOINT SHIELD PO) Take 1 tablet by mouth 2 (two) times daily.   . Multiple Vitamin (MULTIVITAMIN WITH MINERALS) TABS tablet Take 0.5 tablets by mouth 2 (two) times daily.  . nitroGLYCERIN (NITROSTAT) 0.4 MG SL tablet Place 1 tablet (0.4 mg total) under the tongue every 5 (five) minutes as needed.  . pantoprazole (PROTONIX) 40 MG  tablet TAKE ONE (1) TABLET EACH DAY  . Polyethyl Glycol-Propyl Glycol (SYSTANE OP) Place 1 drop into both eyes daily as needed (dry eyes).  . potassium chloride (K-DUR) 10 MEQ tablet TAKE ONE (1) TABLET EACH DAY AS NEEDED  . PRESCRIPTION MEDICATION Inhale into the lungs at bedtime. CPAP  . propranolol (INDERAL) 10 MG tablet TAKE 1 TABLET  BY MOUTH 3 TIMES A DAY  . ranitidine (ZANTAC) 75 MG tablet Take 75 mg by mouth 2 (two) times daily.  . rosuvastatin (CRESTOR) 40 MG tablet TAKE ONE (1) TABLET EACH DAY  . traMADol (ULTRAM) 50 MG tablet Take 50 mg by mouth every 8 (eight) hours as needed.  . cyclobenzaprine (FLEXERIL) 10 MG tablet cyclobenzaprine 10 mg tablet  Take 1 tablet twice a day by oral route.  . predniSONE (DELTASONE) 10 MG tablet Take 4 tablets once daily for 2 days, 3 tabs daily for 2 days, 2 tabs daily for 2 days, 1 tab daily for 2 days. (Patient not taking: Reported on 12/13/2017)   No facility-administered encounter medications on file as of 12/13/2017.     Review of Systems  Constitutional: Negative for appetite change and unexpected weight change.  HENT: Negative for congestion and sinus pressure.   Respiratory: Negative for cough, chest tightness and shortness of breath.   Cardiovascular: Negative for chest pain and palpitations.       Some persistent pedal and lower extremity swelling.    Gastrointestinal: Negative for abdominal pain, diarrhea, nausea and vomiting.  Genitourinary: Negative for difficulty urinating and dysuria.  Musculoskeletal: Positive for back pain. Negative for joint swelling and myalgias.  Skin: Negative for color change and rash.  Neurological: Negative for dizziness, light-headedness and headaches.  Psychiatric/Behavioral: Negative for agitation and dysphoric mood.       Objective:    Physical Exam  Constitutional: She appears well-developed and well-nourished. No distress.  HENT:  Nose: Nose normal.  Mouth/Throat: Oropharynx is clear and  moist.  Neck: Neck supple. No thyromegaly present.  Cardiovascular: Normal rate and regular rhythm.  Pulmonary/Chest: Breath sounds normal. No respiratory distress. She has no wheezes.  Abdominal: Soft. Bowel sounds are normal. There is no tenderness.  Musculoskeletal: She exhibits no tenderness.  Pedal and lower extremity swelling.  No increased erythema.    Lymphadenopathy:    She has no cervical adenopathy.  Skin: No rash noted. No erythema.  Psychiatric: She has a normal mood and affect. Her behavior is normal.    BP (!) 142/92   Pulse 60   Temp 98.2 F (36.8 C) (Oral)   Ht 4' 11"  (1.499 m)   Wt 188 lb 3.2 oz (85.4 kg)   LMP 05/01/1972   SpO2 97%   BMI 38.01 kg/m  Wt Readings from Last 3 Encounters:  12/13/17 188 lb 3.2 oz (85.4 kg)  11/22/17 188 lb (85.3 kg)  09/06/17 187 lb 8 oz (85 kg)     Lab Results  Component Value Date   WBC 6.1 12/07/2017   HGB 12.8 12/07/2017   HCT 38.3 12/07/2017   PLT 219.0 12/07/2017   GLUCOSE 108 (H) 12/07/2017   CHOL 149 12/07/2017   TRIG 78.0 12/07/2017   HDL 68.40 12/07/2017   LDLCALC 65 12/07/2017   ALT 37 (H) 12/07/2017   AST 33 12/07/2017   NA 145 12/07/2017   K 3.9 12/07/2017   CL 104 12/07/2017   CREATININE 1.13 12/07/2017   BUN 20 12/07/2017   CO2 34 (H) 12/07/2017   TSH 3.30 08/07/2017   INR 0.99 03/25/2016   HGBA1C 6.4 12/07/2017   MICROALBUR 2.5 (H) 09/06/2017    Mm Screening Breast Tomo Bilateral  Result Date: 07/05/2017 CLINICAL DATA:  Screening. EXAM: DIGITAL SCREENING BILATERAL MAMMOGRAM WITH TOMO AND CAD COMPARISON:  Previous exam(s). ACR Breast Density Category c: The breast tissue is heterogeneously dense, which may obscure small masses. FINDINGS:  There are no findings suspicious for malignancy. Images were processed with CAD. IMPRESSION: No mammographic evidence of malignancy. A result letter of this screening mammogram will be mailed directly to the patient. RECOMMENDATION: Screening mammogram in one year.  (Code:SM-B-01Y) BI-RADS CATEGORY  1: Negative. Electronically Signed   By: Curlene Dolphin M.D.   On: 07/05/2017 15:24       Assessment & Plan:   Problem List Items Addressed This Visit    Coronary artery disease involving native coronary artery of native heart with unstable angina pectoris ([place])    No chest pain.  Followed by cardiology.  Continue risk factor modification.        Diabetes mellitus ([place])    Low carb diet and exercise.  Follow met b and a1c.        Relevant Orders   Hemoglobin A0E   Basic metabolic panel   Essential hypertension, benign (Chronic)    Blood pressure as outlined.  Continue same medication regimen.  Follow pressures.  Follow metabolic panel.        GERD (gastroesophageal reflux disease)    Controlled on protonix.  Follow.        Hypercholesterolemia (Chronic)    On crestor.  Low cholesterol diet and exercise.  Follow lipid panel and liver function tests.        Relevant Orders   Hepatic function panel   Lipid panel   Obstructive sleep apnea (Chronic)    Continue cpap.        Paroxysmal atrial fibrillation (HCC)    On eliquis.  Followed by cardiology.        Stress    Discussed with her today.  Overall she feels she is handling things relatively well.  Does not feel needs anything more at this time.  Follow.            Einar Pheasant, MD

## 2017-12-16 ENCOUNTER — Encounter: Payer: Self-pay | Admitting: Internal Medicine

## 2017-12-16 NOTE — Assessment & Plan Note (Signed)
Low carb diet and exercise.  Follow met b and a1c.   

## 2017-12-16 NOTE — Assessment & Plan Note (Signed)
On eliquis.  Followed by cardiology.

## 2017-12-16 NOTE — Assessment & Plan Note (Signed)
No chest pain.  Followed by cardiology.  Continue risk factor modification.

## 2017-12-16 NOTE — Assessment & Plan Note (Signed)
Discussed with her today.  Overall she feels she is handling things relatively well.  Does not feel needs anything more at this time.  Follow.   

## 2017-12-16 NOTE — Assessment & Plan Note (Signed)
On crestor.  Low cholesterol diet and exercise.  Follow lipid panel and liver function tests.   

## 2017-12-16 NOTE — Assessment & Plan Note (Signed)
Controlled on protonix.  Follow.   

## 2017-12-16 NOTE — Assessment & Plan Note (Signed)
Blood pressure as outlined.  Continue same medication regimen.  Follow pressures.  Follow metabolic panel.  

## 2017-12-16 NOTE — Assessment & Plan Note (Signed)
Continue cpap.  

## 2017-12-19 DIAGNOSIS — M545 Low back pain: Secondary | ICD-10-CM | POA: Diagnosis not present

## 2017-12-19 DIAGNOSIS — M48062 Spinal stenosis, lumbar region with neurogenic claudication: Secondary | ICD-10-CM | POA: Diagnosis not present

## 2017-12-19 DIAGNOSIS — M4316 Spondylolisthesis, lumbar region: Secondary | ICD-10-CM | POA: Diagnosis not present

## 2017-12-19 DIAGNOSIS — I10 Essential (primary) hypertension: Secondary | ICD-10-CM | POA: Diagnosis not present

## 2017-12-19 DIAGNOSIS — Z6837 Body mass index (BMI) 37.0-37.9, adult: Secondary | ICD-10-CM | POA: Diagnosis not present

## 2017-12-20 ENCOUNTER — Other Ambulatory Visit: Payer: Self-pay | Admitting: Cardiovascular Disease

## 2017-12-20 NOTE — Progress Notes (Signed)
Cardiology Office Note  Date:  12/21/2017   ID:  RUCHEL BRANDENBURGER, DOB [birth year], MRN [id number]  PCP:  Einar Pheasant, MD   Chief Complaint  Patient presents with  . other    1 year follow up. Meds reviewed by the pt. verbally. Pt. c/o shortness of breath with occas. twinge in chest at times.     HPI:  Ms. Chronister is a pleasant 77 year old woman with history of  coronary artery disease,  40% proximal RCA disease on cardiac catheterization in 01/2010,  chronic shortness of breath  STEMI November 2017 stent x2 to her RCA atrial fibrillation while in the hospital November 2017 who presents For follow-up of her coronary artery disease   In follow-up today she is Bothered by chronic back pain Has seen Dr. Arnoldo Morale in [place] Recent cortisone shot Reports no significant improvement in her symptoms  Denies any chest burning, previous anginal symptoms No shortness of breath or chest discomfort on exertion No recent trips to the hospital  Has not been taking nitroglycerin Lives at the [facility] [employer] anticoagulation   hemoglobin A1c 6.5 Total chol 149, LDL 65  Seen in the emergency room 11/11/2016 for chest pain Constant, worse with movement, located under left breast, squeezing in nature No significant change in shortness of breath symptoms EKG unchanged, cardiac enzymes negative 2 She feels pain was musculoskeletal from lifting heavy items as she is moving  EKG personally reviewed by myself on todays visit Shows normal sinus rhythm rate 60 bpm  poor R-wave progression to the anterior precordial leads, left axis deviation  Borderline intraventricular conduction delay  Other past medical history reviewed Had chest pressure, sweating November 2017 in the setting of her STEMI "looked white"  Echocardiogram in 2015 showing normal ejection fraction, borderline elevated right ventricular systolic pressures, diastolic relaxation abnormality.   PMH:   has a  past medical history of Arrhythmia, CAD S/P PCI pRCA Promus DES 3.5 x 16 (4.1 mm), ostRPDA Promus DES 2.5 x 12 (2.7 mm) (03/25/2016), Diverticulitis, Endometriosis, Heart murmur, Hiatal hernia, History of colon polyps (08/1994), Hypercholesterolemia, Hypertension, Migraines, Osteoporosis, Rheumatic fever, Sleep apnea, ST elevation myocardial infarction (STEMI) of inferolateral wall, initial episode of care ([place]) (03/25/2016), and Urine incontinence.  PSH:    Past Surgical History:  Procedure Laterality Date  . ABDOMINAL HYSTERECTOMY     ovaries not removed  . APPENDECTOMY     was removed during hysterectomy  . Breast biopsies     x2  . BREAST BIOPSY Bilateral "years ago"  . BREAST SURGERY Bilateral   . CARDIAC CATHETERIZATION  2011   moderate 40% RCA disease  . CARDIAC CATHETERIZATION  01/2010   Dr. Woodley [email or web address]@[email or web address] : Only noted 40% RCA  . CARDIAC CATHETERIZATION N/A 03/25/2016   Procedure: Left Heart Cath and Coronary Angiography;  Surgeon: 04/07/2016, MD;  Location: [place] [facility];  Service: Cardiovascular;  Laterality: N/A;  . CARDIAC CATHETERIZATION N/A 03/25/2016   Procedure: Coronary Stent Intervention;  Surgeon: 04/07/2016, MD;  Location: [place] [facility];  Service: Cardiovascular;  Laterality: N/A;  . COLONOSCOPY  2013  . ESOPHAGOGASTRODUODENOSCOPY (EGD) WITH PROPOFOL N/A 03/17/2015   Procedure: ESOPHAGOGASTRODUODENOSCOPY (EGD) WITH PROPOFOL;  Surgeon: 03/30/2015, MD;  Location: [facility];  Service: Gastroenterology;  Laterality: N/A;  . NM MYOVIEW ([place] HX)  07/2015   No evidence ischemia or infarction. EF 66%. Low risk  . TONSILLECTOMY    . TRANSTHORACIC ECHOCARDIOGRAM  10/2013   Normal LV size and  function. EF 55-65%. GR 1 DD. Otherwise normal.    Current Outpatient Medications  Medication Sig Dispense Refill  . acetaminophen (TYLENOL) 500 MG tablet Take 500 mg by mouth daily as needed for headache (pain).    Marland Kitchen albuterol (PROVENTIL HFA;VENTOLIN  HFA) 108 (90 Base) MCG/ACT inhaler Inhale 2 puffs into the lungs every 6 (six) hours as needed for wheezing or shortness of breath. 1 Inhaler 6  . amiodarone (PACERONE) 200 MG tablet TAKE ONE (1) TABLET EACH DAY 30 tablet 6  . amLODipine (NORVASC) 2.5 MG tablet TAKE ONE (1) TABLET EACH DAY 30 tablet 6  . azelastine (ASTELIN) 0.1 % nasal spray Place 2 sprays into both nostrils daily as needed for rhinitis. Use in each nostril as directed    . BREO ELLIPTA 100-25 MCG/INH AEPB Inhale 1 puff into the lungs daily. 60 each 4  . cholecalciferol (VITAMIN D) 1000 units tablet Take 1,000 Units by mouth 2 (two) times daily.    Marland Kitchen ELIQUIS 5 MG TABS tablet TAKE ONE TABLET TWICE DAILY 60 tablet 5  . fish oil-omega-3 fatty acids 1000 MG capsule Take 1 g by mouth 2 (two) times daily.     . furosemide (LASIX) 20 MG tablet TAKE ONE (1) TABLET EACH DAY 90 tablet 3  . losartan-hydrochlorothiazide (HYZAAR) 100-12.5 MG tablet Take 1 tablet by mouth daily. 90 tablet 1  . Menthol, Topical Analgesic, (BIOFREEZE EX) Apply 1 application topically daily as needed (pain).    . Misc Natural Products (OSTEO BI-FLEX JOINT SHIELD PO) Take 1 tablet by mouth 2 (two) times daily.     . Multiple Vitamin (MULTIVITAMIN WITH MINERALS) TABS tablet Take 0.5 tablets by mouth 2 (two) times daily.    . nitroGLYCERIN (NITROSTAT) 0.4 MG SL tablet Place 1 tablet (0.4 mg total) under the tongue every 5 (five) minutes as needed. 25 tablet 3  . pantoprazole (PROTONIX) 40 MG tablet TAKE ONE (1) TABLET EACH DAY 30 tablet 6  . Polyethyl Glycol-Propyl Glycol (SYSTANE OP) Place 1 drop into both eyes daily as needed (dry eyes).    . potassium chloride (K-DUR) 10 MEQ tablet TAKE ONE (1) TABLET EACH DAY AS NEEDED 90 tablet 3  . PRESCRIPTION MEDICATION Inhale into the lungs at bedtime. CPAP    . propranolol (INDERAL) 10 MG tablet TAKE 1 TABLET BY MOUTH 3 TIMES A DAY 90 tablet 0  . ranitidine (ZANTAC) 75 MG tablet Take 75 mg by mouth 2 (two) times daily.     . rosuvastatin (CRESTOR) 40 MG tablet TAKE ONE (1) TABLET EACH DAY 30 tablet 6  . traMADol (ULTRAM) 50 MG tablet Take 50 mg by mouth every 8 (eight) hours as needed.     No current facility-administered medications for this visit.      Allergies:   Penicillins and Sulfa antibiotics   Social History:  The patient  reports that she has never smoked. She has never used smokeless tobacco. She reports that she does not drink alcohol or use drugs.   Family History:   family history includes Arthritis in her mother; Breast cancer in her unknown relative; Heart attack in her daughter, father, and sister; Heart disease in her father; Hypertension in her mother; Osteoporosis in her mother; Stroke in her brother, daughter, and mother.    Review of Systems: Review of Systems  Constitutional: Positive for malaise/fatigue.  Respiratory: Negative.   Cardiovascular: Negative.   Gastrointestinal: Negative.   Musculoskeletal: Negative.   Neurological: Negative.   Psychiatric/Behavioral: Negative.  All other systems reviewed and are negative.    PHYSICAL EXAM: VS:  BP 120/80 (BP Location: Left Arm, Patient Position: Sitting, Cuff Size: Normal)   Pulse 61   Ht 4\' 11"  (1.499 m)   Wt 189 lb 12 oz (86.1 kg)   LMP 05/01/1972   BMI 38.32 kg/m  , BMI Body mass index is 38.32 kg/m. Constitutional:  oriented to person, place, and time. No distress. obese HENT:  Head: Normocephalic and atraumatic.  Eyes:  no discharge. No scleral icterus.  Neck: Normal range of motion. Neck supple. No JVD present.  Cardiovascular:Regular rate and rhythm,  normal heart sounds and intact distal pulses. Exam reveals no gallop and no friction rub. No edema No murmur heard. Pulmonary/Chest: Effort normal and breath sounds normal. No stridor. No respiratory distress.  no wheezes.  no rales.  no tenderness.  Abdominal: Soft.  no distension.  no tenderness.  Musculoskeletal: Normal range of motion.  no  tenderness or  deformity.  Neurological:  normal muscle tone. Coordination normal. No atrophy Skin: Skin is warm and dry. No rash noted. not diaphoretic.  Psychiatric:  normal mood and affect. behavior is normal. Thought content normal.    Recent Labs: 08/07/2017: TSH 3.30 12/07/2017: ALT 37; BUN 20; Creatinine, Ser 1.13; Hemoglobin 12.8; Platelets 219.0; Potassium 3.9; Sodium 145    Lipid Panel Lab Results  Component Value Date   CHOL 149 12/07/2017   HDL 68.40 12/07/2017   LDLCALC 65 12/07/2017   TRIG 78.0 12/07/2017      Wt Readings from Last 3 Encounters:  12/21/17 189 lb 12 oz (86.1 kg)  12/13/17 188 lb 3.2 oz (85.4 kg)  11/22/17 188 lb (85.3 kg)       ASSESSMENT AND PLAN:  Essential hypertension, benign Blood pressure is well controlled on today's visit. No changes made to the medications. stable  Hypercholesterolemia Cholesterol is at goal on the current lipid regimen. No changes to the medications were made. stable  Coronary artery disease involving native coronary artery of native heart with unstable angina pectoris ([place]) Currently with no symptoms of angina. No further workup at this time. Continue current medication regimen.Stable CAD S/P PCI pRCA Promus DES 3.5 x 16 (4.1 mm), ostRPDA Promus DES 2.5 x 12 (2.7 mm) On eliquis  we will hold aspirin  Paroxysmal atrial fibrillation Maintaining normal sinus rhythm,  continue anticoagulation, elevated CHADS VASC (at least 4)  SOB (shortness of breath) Recommended weight loss, walking program for conditioning  Total encounter time more than 25 minutes Greater than 50% was spent in counseling and coordination of care with the patient  Disposition:   F/U  12 months   Orders Placed This Encounter  Procedures  . EKG 12-Lead     Signed, Esmond Plants, M.D., Ph.D. 12/21/2017  [place], [place]

## 2017-12-21 ENCOUNTER — Ambulatory Visit (INDEPENDENT_AMBULATORY_CARE_PROVIDER_SITE_OTHER): Payer: Medicare Other | Admitting: Cardiovascular Disease

## 2017-12-21 ENCOUNTER — Encounter: Payer: Self-pay | Admitting: Cardiovascular Disease

## 2017-12-21 VITALS — BP 120/80 | HR 61 | Ht 59.0 in | Wt 189.8 lb

## 2017-12-21 DIAGNOSIS — E7800 Pure hypercholesterolemia, unspecified: Secondary | ICD-10-CM | POA: Diagnosis not present

## 2017-12-21 DIAGNOSIS — E669 Obesity, unspecified: Secondary | ICD-10-CM | POA: Diagnosis not present

## 2017-12-21 DIAGNOSIS — I10 Essential (primary) hypertension: Secondary | ICD-10-CM

## 2017-12-21 DIAGNOSIS — I25110 Atherosclerotic heart disease of native coronary artery with unstable angina pectoris: Secondary | ICD-10-CM

## 2017-12-21 DIAGNOSIS — I2510 Atherosclerotic heart disease of native coronary artery without angina pectoris: Secondary | ICD-10-CM | POA: Diagnosis not present

## 2017-12-21 DIAGNOSIS — Z9861 Coronary angioplasty status: Secondary | ICD-10-CM | POA: Diagnosis not present

## 2017-12-21 DIAGNOSIS — I480 Paroxysmal atrial fibrillation: Secondary | ICD-10-CM | POA: Diagnosis not present

## 2017-12-21 DIAGNOSIS — E1159 Type 2 diabetes mellitus with other circulatory complications: Secondary | ICD-10-CM | POA: Diagnosis not present

## 2017-12-21 MED ORDER — NITROGLYCERIN 0.4 MG SL SUBL: 0.4000 mg | SUBLINGUAL_TABLET | SUBLINGUAL | 3 refills | Status: DC | PRN

## 2017-12-21 NOTE — Patient Instructions (Addendum)
Look for "Sock-it" for compression hose Ace wrap Lymph pump:  Vein and vascular doctor: Dr. Dew/Schneir  Medication Instructions:   Please stop the amlodipine, this can cause leg swelling  Labwork:  No new labs needed  Testing/Procedures:  No further testing at this time   Follow-Up: It was a pleasure seeing you in the office today. Please call us if you have new issues that need to be addressed before your next appt.  [phone]  Your physician wants you to follow-up in: 12 months.  You will receive a reminder letter in the mail two months in advance. If you don't receive a letter, please call our office to schedule the follow-up appointment.  If you need a refill on your cardiac medications before your next appointment, please call your pharmacy.  For educational health videos Log in to : [email or web address] Or : [email or web address], password : [email or web address]

## 2017-12-28 ENCOUNTER — Ambulatory Visit
Admission: RE | Admit: 2017-12-28 | Discharge: 2017-12-28 | Disposition: A | Discharge status: Home / Self Care | Payer: Medicare Other | Source: Ambulatory Visit | Attending: Family | Admitting: Family

## 2017-12-28 ENCOUNTER — Ambulatory Visit (INDEPENDENT_AMBULATORY_CARE_PROVIDER_SITE_OTHER): Payer: Medicare Other | Admitting: Family

## 2017-12-28 ENCOUNTER — Telehealth: Payer: Self-pay | Admitting: Family

## 2017-12-28 ENCOUNTER — Encounter: Payer: Self-pay | Admitting: Family

## 2017-12-28 ENCOUNTER — Telehealth: Payer: Self-pay | Admitting: Internal Medicine

## 2017-12-28 VITALS — BP 150/60 | HR 60 | Temp 98.4°F | Resp 18 | Wt 194.4 lb

## 2017-12-28 DIAGNOSIS — Z9861 Coronary angioplasty status: Secondary | ICD-10-CM

## 2017-12-28 DIAGNOSIS — I2510 Atherosclerotic heart disease of native coronary artery without angina pectoris: Secondary | ICD-10-CM | POA: Diagnosis not present

## 2017-12-28 DIAGNOSIS — L03116 Cellulitis of left lower limb: Secondary | ICD-10-CM

## 2017-12-28 MED ORDER — DOXYCYCLINE HYCLATE 100 MG PO TBEC: 100.0000 mg | DELAYED_RELEASE_TABLET | Freq: Two times a day (BID) | ORAL | 0 refills | Status: DC

## 2017-12-28 NOTE — Telephone Encounter (Signed)
Reviewed chart.  Pt evaluated by Mable Paris today.  Ultrasound negative for DVT.  Per phone note, pt already notified by Joycelyn Schmid.

## 2017-12-28 NOTE — Telephone Encounter (Signed)
Kim calling stat results of U/S on today of bilateral lower extremities. Kim reports that pt is negative for DVT in both legs. Results available in Epic.

## 2017-12-28 NOTE — Progress Notes (Signed)
Subjective:    Patient ID: April [name], female    DOB: 07-24-1940, 77 y.o.   MRN: [id number]  CC: April [name] is a 77 y.o. female who presents today for an acute visit.    HPI: CC: blister on LLE lateral side x one week  unchanged. No recent injury  Redness present LLE since 07/2017 after 'sun burn.' . Tried neosporin without relief. Clear discharge. Swelling BLE, chronic for 'ages'. No pain in calves when walking. Swelling improves with elevation. She cannot wear compression stockings as trouble getting them on.   No fever, chills. States SOB 'all the time' for years. This is unchanged. It has not worsened. No coughing.   H/o HTN, CAD, atrial fib, MI 2017, DM.  No CHF  Have not been to vascular. No h/o cellulitis, MRSA.   No recent surgery, malignancy, immobilization.   Echo EF 55-65% 2015  [place] 11/2017- CAD- on eliquis, asa, Afib; dced amlodipine; he mentioned Dr Dew/Schneir   HISTORY:  Past Medical History:  Diagnosis Date  . Arrhythmia    history of an irregular heart beat  . CAD S/P PCI pRCA Promus DES 3.5 x 16 (4.1 mm), ostRPDA Promus DES 2.5 x 12 (2.7 mm) 03/25/2016  . Diverticulitis   . Endometriosis   . Heart murmur    no significant valvular lesion noted on Echo  . Hiatal hernia   . History of colon polyps 08/1994  . Hypercholesterolemia   . Hypertension   . Migraines    history of migraines  . Osteoporosis   . Rheumatic fever   . Sleep apnea    CPAP  . ST elevation myocardial infarction (STEMI) of inferolateral wall, initial episode of care ([place]) 03/25/2016  . Urine incontinence    Past Surgical History:  Procedure Laterality Date  . ABDOMINAL HYSTERECTOMY     ovaries not removed  . APPENDECTOMY     was removed during hysterectomy  . Breast biopsies     x2  . BREAST BIOPSY Bilateral "years ago"  . BREAST SURGERY Bilateral   . CARDIAC CATHETERIZATION  2011   moderate 40% RCA disease  . CARDIAC CATHETERIZATION  01/2010   Dr. Gollan@[facility] :  Only noted 40% RCA  . CARDIAC CATHETERIZATION N/A 03/25/2016   Procedure: Left Heart Cath and Coronary Angiography;  Surgeon: 04/07/2016, MD;  Location: [facility];  Service: Cardiovascular;  Laterality: N/A;  . CARDIAC CATHETERIZATION N/A 03/25/2016   Procedure: Coronary Stent Intervention;  Surgeon: 04/07/2016, MD;  Location: [facility];  Service: Cardiovascular;  Laterality: N/A;  . COLONOSCOPY  2013  . ESOPHAGOGASTRODUODENOSCOPY (EGD) WITH PROPOFOL N/A 03/17/2015   Procedure: ESOPHAGOGASTRODUODENOSCOPY (EGD) WITH PROPOFOL;  Surgeon: 03/30/2015, MD;  Location: [facility];  Service: Gastroenterology;  Laterality: N/A;  . NM MYOVIEW ([place] [facility])  07/2015   No evidence ischemia or infarction. EF 66%. Low risk  . TONSILLECTOMY    . TRANSTHORACIC ECHOCARDIOGRAM  10/2013   Normal LV size and function. EF 55-65%. GR 1 DD. Otherwise normal.   Family History  Problem Relation Age of Onset  . Arthritis Mother   . Stroke Mother   . Hypertension Mother   . Osteoporosis Mother   . Heart disease Father        MI  . Heart attack Father   . Stroke Brother   . Heart attack Sister   . Breast cancer Unknown        first cousin x  2  . Stroke Daughter   . Heart attack Daughter   . Colon cancer Neg Hx     Allergies: Penicillins and Sulfa antibiotics Current Outpatient Medications on File Prior to Visit  Medication Sig Dispense Refill  . acetaminophen (TYLENOL) 500 MG tablet Take 500 mg by mouth daily as needed for headache (pain).    Marland Kitchen albuterol (PROVENTIL HFA;VENTOLIN HFA) 108 (90 Base) MCG/ACT inhaler Inhale 2 puffs into the lungs every 6 (six) hours as needed for wheezing or shortness of breath. 1 Inhaler 6  . amiodarone (PACERONE) 200 MG tablet TAKE ONE (1) TABLET EACH DAY 30 tablet 6  . azelastine (ASTELIN) 0.1 % nasal spray Place 2 sprays into both nostrils daily as needed for rhinitis. Use in each nostril as directed    . BREO ELLIPTA 100-25 MCG/INH  AEPB Inhale 1 puff into the lungs daily. 60 each 4  . cholecalciferol (VITAMIN D) 1000 units tablet Take 1,000 Units by mouth 2 (two) times daily.    Marland Kitchen ELIQUIS 5 MG TABS tablet TAKE ONE TABLET TWICE DAILY 60 tablet 5  . fish oil-omega-3 fatty acids 1000 MG capsule Take 1 g by mouth 2 (two) times daily.     . furosemide (LASIX) 20 MG tablet TAKE ONE (1) TABLET EACH DAY 90 tablet 3  . losartan-hydrochlorothiazide (HYZAAR) 100-12.5 MG tablet Take 1 tablet by mouth daily. 90 tablet 1  . Menthol, Topical Analgesic, (BIOFREEZE EX) Apply 1 application topically daily as needed (pain).    . Misc Natural Products (OSTEO BI-FLEX JOINT SHIELD PO) Take 1 tablet by mouth 2 (two) times daily.     . Multiple Vitamin (MULTIVITAMIN WITH MINERALS) TABS tablet Take 0.5 tablets by mouth 2 (two) times daily.    . nitroGLYCERIN (NITROSTAT) 0.4 MG SL tablet Place 1 tablet (0.4 mg total) under the tongue every 5 (five) minutes as needed. 25 tablet 3  . pantoprazole (PROTONIX) 40 MG tablet TAKE ONE (1) TABLET EACH DAY 30 tablet 6  . Polyethyl Glycol-Propyl Glycol (SYSTANE OP) Place 1 drop into both eyes daily as needed (dry eyes).    . potassium chloride (K-DUR) 10 MEQ tablet TAKE ONE (1) TABLET EACH DAY AS NEEDED 90 tablet 3  . PRESCRIPTION MEDICATION Inhale into the lungs at bedtime. CPAP    . propranolol (INDERAL) 10 MG tablet TAKE 1 TABLET BY MOUTH 3 TIMES A DAY 90 tablet 0  . ranitidine (ZANTAC) 75 MG tablet Take 75 mg by mouth 2 (two) times daily.    . rosuvastatin (CRESTOR) 40 MG tablet TAKE ONE (1) TABLET EACH DAY 30 tablet 6  . traMADol (ULTRAM) 50 MG tablet Take 50 mg by mouth every 8 (eight) hours as needed.     No current facility-administered medications on file prior to visit.     Social History   Tobacco Use  . Smoking status: Never Smoker  . Smokeless tobacco: Never Used  Substance Use Topics  . Alcohol use: No    Alcohol/week: 0.0 standard drinks  . Drug use: No    Review of Systems    Constitutional: Negative for chills and fever.  Respiratory: Positive for shortness of breath (baseline ). Negative for cough.   Cardiovascular: Positive for leg swelling. Negative for chest pain and palpitations.  Gastrointestinal: Negative for nausea and vomiting.  Skin: Positive for rash.      Objective:    BP (!) 150/60 (BP Location: Left Arm)   Pulse 60   Temp 98.4 F (36.9 C) (Oral)  Resp 18   Wt 194 lb 6 oz (88.2 kg)   LMP 05/01/1972   SpO2 98%   BMI 39.26 kg/m  BP Readings from Last 3 Encounters:  12/28/17 (!) 150/60  12/21/17 120/80  12/13/17 (!) 142/92   Wt Readings from Last 3 Encounters:  12/28/17 194 lb 6 oz (88.2 kg)  12/21/17 189 lb 12 oz (86.1 kg)  12/13/17 188 lb 3.2 oz (85.4 kg)      Physical Exam  Constitutional: She appears well-developed and well-nourished.  Eyes: Conjunctivae are normal.  Cardiovascular: Normal rate, regular rhythm, normal heart sounds and normal pulses.  +1 to 2 pitting BLE edema. No palpable cords or masses. No  increased warmth.  Hyperpigmentation noted LLE.  LE warm and palpable pedal pulses.   Pulmonary/Chest: Effort normal and breath sounds normal. She has no wheezes. She has no rhonchi. She has no rales.  Neurological: She is alert.  Skin: Skin is warm and dry.     Serous filled vesicular lesion. Surrounded by erythematous, weepy skin.   Psychiatric: She has a normal mood and affect. Her speech is normal and behavior is normal. Thought content normal.  Vitals reviewed.      Assessment & Plan:   1. Cellulitis of left lower extremity Patient well appearing. Concern for cellulitis. She is PCN allergic, start doxycycline. Concern for underlying lymphedema, poor healing in setting of DM. Referral to vascular for further eval. Stat venous US today. Wound recheck next week. Advised if worsened over weekend, to go to ED, she verbalized understanding of this.   - Ambulatory referral to Vascular Surgery - US Venous Img  Lower Bilateral - doxycycline (DORYX) 100 MG EC tablet; Take 1 tablet (100 mg total) by mouth 2 (two) times daily.  Dispense: 20 tablet; Refill: 0    I am having Kareema S. Mandich start on doxycycline. I am also having her maintain her fish oil-omega-3 fatty acids, Misc Natural Products (OSTEO BI-FLEX JOINT SHIELD PO), cholecalciferol, multivitamin with minerals, PRESCRIPTION MEDICATION, (Menthol, Topical Analgesic, (BIOFREEZE EX)), acetaminophen, Polyethyl Glycol-Propyl Glycol (SYSTANE OP), ranitidine, furosemide, potassium chloride, albuterol, BREO ELLIPTA, amiodarone, pantoprazole, rosuvastatin, losartan-hydrochlorothiazide, ELIQUIS, traMADol, azelastine, propranolol, and nitroGLYCERIN.   Meds ordered this encounter  Medications  . doxycycline (DORYX) 100 MG EC tablet    Sig: Take 1 tablet (100 mg total) by mouth 2 (two) times daily.    Dispense:  20 tablet    Refill:  0    Order Specific Question:   Supervising Provider    Answer:   Crecencio Mc [[id number]]    Return precautions given.   Risks, benefits, and alternatives of the medications and treatment plan prescribed today were discussed, and patient expressed understanding.   Education regarding symptom management and diagnosis given to patient on AVS.  Continue to follow with Einar Pheasant, MD for routine health maintenance.   April [name] and I agreed with plan.   Mable Paris, FNP

## 2017-12-28 NOTE — Telephone Encounter (Signed)
Results of ultrasound

## 2017-12-28 NOTE — Patient Instructions (Signed)
Ultrasound today  Elevate legs.   Start doxycycline. Avoid sun as this medication can make sun burn easier Ensure to take probiotics while on antibiotics and also for 2 weeks after completion. It is important to re-colonize the gut with good bacteria and also to prevent any diarrheal infections associated with antibiotic use.   Today we discussed referrals, orders. VASCULAR   I have placed these orders in the system for you.  Please be sure to give [place] a call if you have not heard from our office regarding scheduling a test or regarding referral in a timely manner.  It is very important that you let me know as soon as possible.     Cellulitis, Adult Cellulitis is a skin infection. The infected area is usually red and sore. This condition occurs most often in the arms and lower legs. It is very important to get treated for this condition. Follow these instructions at home:  Take over-the-counter and prescription medicines only as told by your doctor.  If you were prescribed an antibiotic medicine, take it as told by your doctor. Do not stop taking the antibiotic even if you start to feel better.  Drink enough fluid to keep your pee (urine) clear or pale yellow.  Do not touch or rub the infected area.  Raise (elevate) the infected area above the level of your heart while you are sitting or lying down.  Place warm or cold wet cloths (warm or cold compresses) on the infected area. Do this as told by your doctor.  Keep all follow-up visits as told by your doctor. This is important. These visits let your doctor make sure your infection is not getting worse. Contact a doctor if:  You have a fever.  Your symptoms do not get better after 1-2 days of treatment.  Your bone or joint under the infected area starts to hurt after the skin has healed.  Your infection comes back. This can happen in the same area or another area.  You have a swollen bump in the infected area.  You have new  symptoms.  You feel ill and also have muscle aches and pains. Get help right away if:  Your symptoms get worse.  You feel very sleepy.  You throw up (vomit) or have watery poop (diarrhea) for a long time.  There are red streaks coming from the infected area.  Your red area gets larger.  Your red area turns darker. This information is not intended to replace advice given to you by your health care provider. Make sure you discuss any questions you have with your health care provider. Document Released: 10/04/2007 Document Revised: 09/23/2015 Document Reviewed: 02/24/2015 Elsevier Interactive Patient Education  2018 [employer].

## 2017-12-28 NOTE — Telephone Encounter (Signed)
Spoke with patient  No dvt in [place] Left She is aware

## 2017-12-28 NOTE — Telephone Encounter (Signed)
Results are in epic.

## 2018-01-10 ENCOUNTER — Telehealth: Payer: Self-pay | Admitting: Internal Medicine

## 2018-01-10 NOTE — Telephone Encounter (Signed)
If she is still having persistent problems, I can see her tomorrow.  (work in 12:30).

## 2018-01-10 NOTE — Telephone Encounter (Signed)
Called patient to verify if she needed a refill on doxycycline. Patient was seen by Joycelyn Schmid on 12-28-17 for cellulitis and was prescribed it then. I asked the patient if her leg was still bothering her. She states that it is still swollen and red, but not as painful and she was thinking that she may need another round of the doxycycline.

## 2018-01-10 NOTE — Telephone Encounter (Signed)
LMTCB

## 2018-01-10 NOTE — Telephone Encounter (Signed)
Copied from [place] [phone]. Topic: Quick Communication - Rx Refill/Question >> Jan 10, 2018 10:37 AM April [name] R wrote: Medication: doxycycline (DORYX) 100 MG EC tablet     Has the patient contacted their pharmacy? No, py doesn't have refills left pt was prescribed to treat cellulitis and it has flared up again  Preferred Pharmacy (with phone number or street name): [place], [place] - [address] [phone] (Phone) [phone] (Fax)    Agent: Please be advised that RX refills may take up to 3 business days. We ask that you follow-up with your pharmacy.

## 2018-01-11 NOTE — Telephone Encounter (Signed)
LMTCB

## 2018-01-11 NOTE — Telephone Encounter (Signed)
Patient is out of town and will not be in until after 7:00 tonight.

## 2018-01-11 NOTE — Telephone Encounter (Signed)
She was requesting more abx.  If improved, but still red and just needs to extend out abx - can send in doxycycline 100mg  bid x 5 more days.  Need to make sure taking a probiotic daily.  Also schedule f/u appt with me next week.

## 2018-01-12 ENCOUNTER — Other Ambulatory Visit: Payer: Self-pay | Admitting: Cardiovascular Disease

## 2018-01-12 ENCOUNTER — Other Ambulatory Visit: Payer: Self-pay | Admitting: Internal Medicine

## 2018-01-14 MED ORDER — PROPRANOLOL HCL 10 MG PO TABS: 10.0000 mg | ORAL_TABLET | Freq: Three times a day (TID) | ORAL | 1 refills | Status: DC

## 2018-01-14 NOTE — Addendum Note (Signed)
Addended by: Alba Destine on: 01/14/2018 08:42 AM   Modules accepted: Orders

## 2018-01-14 NOTE — Telephone Encounter (Signed)
Called patient because I did not reach her on Friday to send in abx. Patient says her leg still gets red and swollen when she does not elevate them for a long period of time. The blister is still there but scabbed over. Advised that you were wanting to see her this week. The only days she can come in are Tuesday after 3, anytime Wednesday and Thursday after 2. I have not sent in any medication. Advised I would call her back and let her know what you would like for her to do.

## 2018-01-14 NOTE — Telephone Encounter (Signed)
Can she come in Wednesday at 12:30.  I do not have opening for the other time slots.

## 2018-01-14 NOTE — Telephone Encounter (Signed)
Left message for patient to return call.

## 2018-01-14 NOTE — Telephone Encounter (Signed)
I have scheduled her for Wednesday at 12:30

## 2018-01-14 NOTE — Telephone Encounter (Signed)
April [name], Patient called back and said she can come Wednesday at 12:30. Can you put in her the slot?

## 2018-01-16 ENCOUNTER — Encounter: Payer: Self-pay | Admitting: Internal Medicine

## 2018-01-16 ENCOUNTER — Ambulatory Visit (INDEPENDENT_AMBULATORY_CARE_PROVIDER_SITE_OTHER): Payer: Medicare Other | Admitting: Internal Medicine

## 2018-01-16 ENCOUNTER — Other Ambulatory Visit: Payer: Self-pay | Admitting: Cardiovascular Disease

## 2018-01-16 ENCOUNTER — Other Ambulatory Visit: Payer: Self-pay | Admitting: Internal Medicine

## 2018-01-16 DIAGNOSIS — Z9861 Coronary angioplasty status: Secondary | ICD-10-CM

## 2018-01-16 DIAGNOSIS — G4733 Obstructive sleep apnea (adult) (pediatric): Secondary | ICD-10-CM | POA: Diagnosis not present

## 2018-01-16 DIAGNOSIS — I2510 Atherosclerotic heart disease of native coronary artery without angina pectoris: Secondary | ICD-10-CM | POA: Diagnosis not present

## 2018-01-16 DIAGNOSIS — R600 Localized edema: Secondary | ICD-10-CM

## 2018-01-16 DIAGNOSIS — E1159 Type 2 diabetes mellitus with other circulatory complications: Secondary | ICD-10-CM | POA: Diagnosis not present

## 2018-01-16 DIAGNOSIS — E7800 Pure hypercholesterolemia, unspecified: Secondary | ICD-10-CM

## 2018-01-16 DIAGNOSIS — L03116 Cellulitis of left lower limb: Secondary | ICD-10-CM | POA: Diagnosis not present

## 2018-01-16 DIAGNOSIS — I10 Essential (primary) hypertension: Secondary | ICD-10-CM | POA: Diagnosis not present

## 2018-01-16 DIAGNOSIS — K219 Gastro-esophageal reflux disease without esophagitis: Secondary | ICD-10-CM | POA: Diagnosis not present

## 2018-01-16 DIAGNOSIS — I480 Paroxysmal atrial fibrillation: Secondary | ICD-10-CM

## 2018-01-16 DIAGNOSIS — I25110 Atherosclerotic heart disease of native coronary artery with unstable angina pectoris: Secondary | ICD-10-CM

## 2018-01-16 MED ORDER — DOXYCYCLINE HYCLATE 100 MG PO TBEC: 100.0000 mg | DELAYED_RELEASE_TABLET | Freq: Two times a day (BID) | ORAL | 0 refills | Status: DC

## 2018-01-16 MED ORDER — MUPIROCIN 2 % EX OINT: TOPICAL_OINTMENT | CUTANEOUS | 0 refills | Status: DC

## 2018-01-16 MED ORDER — TRIAMCINOLONE ACETONIDE 0.1 % EX CREA: 1.0000 "application " | TOPICAL_CREAM | Freq: Two times a day (BID) | CUTANEOUS | 0 refills | Status: DC

## 2018-01-16 NOTE — Progress Notes (Signed)
Patient ID: April [name], female   DOB: 1941/03/19, 77 y.o.   MRN: [id number]   Subjective:    Patient ID: April [name], female    DOB: August 23, 1940, 77 y.o.   MRN: [id number]  HPI  Patient here as a work in with concerns regarding f/u cellulitis lower extremity.  Was seen 12/28/17.  Diagnosed with cellulitis.  Lower extremity ultrasound negative for DVT.  Started on doxycycline.  Took the abx.  Legs improved.  Never completely resolve.  Now feels like is starting to get worsen again.  Some persistent swelling.  Had recommended vascular surgery referral.  Breathing stable. No chest pain.  Did knock a paint can on her foot yesterday.  Light can.  No pain with walking.      Past Medical History:  Diagnosis Date  . Arrhythmia    history of an irregular heart beat  . CAD S/P PCI pRCA Promus DES 3.5 x 16 (4.1 mm), ostRPDA Promus DES 2.5 x 12 (2.7 mm) 03/25/2016  . Diverticulitis   . Endometriosis   . Heart murmur    no significant valvular lesion noted on Echo  . Hiatal hernia   . History of colon polyps 08/1994  . Hypercholesterolemia   . Hypertension   . Migraines    history of migraines  . Osteoporosis   . Rheumatic fever   . Sleep apnea    CPAP  . ST elevation myocardial infarction (STEMI) of inferolateral wall, initial episode of care ([place]) 03/25/2016  . Urine incontinence    Past Surgical History:  Procedure Laterality Date  . ABDOMINAL HYSTERECTOMY     ovaries not removed  . APPENDECTOMY     was removed during hysterectomy  . Breast biopsies     x2  . BREAST BIOPSY Bilateral "years ago"  . BREAST SURGERY Bilateral   . CARDIAC CATHETERIZATION  2011   moderate 40% RCA disease  . CARDIAC CATHETERIZATION  01/2010   Dr. Gollan@[facility] : Only noted 40% RCA  . CARDIAC CATHETERIZATION N/A 03/25/2016   Procedure: Left Heart Cath and Coronary Angiography;  Surgeon: 04/07/2016, MD;  Location: [facility];  Service: Cardiovascular;  Laterality: N/A;  . CARDIAC  CATHETERIZATION N/A 03/25/2016   Procedure: Coronary Stent Intervention;  Surgeon: 04/07/2016, MD;  Location: [facility];  Service: Cardiovascular;  Laterality: N/A;  . COLONOSCOPY  2013  . ESOPHAGOGASTRODUODENOSCOPY (EGD) WITH PROPOFOL N/A 03/17/2015   Procedure: ESOPHAGOGASTRODUODENOSCOPY (EGD) WITH PROPOFOL;  Surgeon: 03/30/2015, MD;  Location: [facility];  Service: Gastroenterology;  Laterality: N/A;  . NM MYOVIEW ([place] [facility])  07/2015   No evidence ischemia or infarction. EF 66%. Low risk  . TONSILLECTOMY    . TRANSTHORACIC ECHOCARDIOGRAM  10/2013   Normal LV size and function. EF 55-65%. GR 1 DD. Otherwise normal.   Family History  Problem Relation Age of Onset  . Arthritis Mother   . Stroke Mother   . Hypertension Mother   . Osteoporosis Mother   . Heart disease Father        MI  . Heart attack Father   . Stroke Brother   . Heart attack Sister   . Breast cancer Unknown        first cousin x 2  . Stroke Daughter   . Heart attack Daughter   . Colon cancer Neg Hx    Social History   Socioeconomic History  . Marital status: Widowed    Spouse name: Not on  file  . Number of children: 2  . Years of education: Not on file  . Highest education level: Not on file  Occupational History  . Not on file  Social Needs  . Financial resource strain: Not on file  . Food insecurity:    Worry: Not on file    Inability: Not on file  . Transportation needs:    Medical: Not on file    Non-medical: Not on file  Tobacco Use  . Smoking status: Never Smoker  . Smokeless tobacco: Never Used  Substance and Sexual Activity  . Alcohol use: No    Alcohol/week: 0.0 standard drinks  . Drug use: No  . Sexual activity: Never  Lifestyle  . Physical activity:    Days per week: Not on file    Minutes per session: Not on file  . Stress: Not on file  Relationships  . Social connections:    Talks on phone: Not on file    Gets together: Not on file    Attends  religious service: Not on file    Active member of club or organization: Not on file    Attends meetings of clubs or organizations: Not on file    Relationship status: Not on file  Other Topics Concern  . Not on file  Social History Narrative  . Not on file    Outpatient Encounter Medications as of 01/16/2018  Medication Sig  . acetaminophen (TYLENOL) 500 MG tablet Take 500 mg by mouth daily as needed for headache (pain).  Marland Kitchen albuterol (PROVENTIL HFA;VENTOLIN HFA) 108 (90 Base) MCG/ACT inhaler Inhale 2 puffs into the lungs every 6 (six) hours as needed for wheezing or shortness of breath.  Marland Kitchen amiodarone (PACERONE) 200 MG tablet TAKE ONE (1) TABLET EACH DAY  . azelastine (ASTELIN) 0.1 % nasal spray Place 2 sprays into both nostrils daily as needed for rhinitis. Use in each nostril as directed  . BREO ELLIPTA 100-25 MCG/INH AEPB Inhale 1 puff into the lungs daily.  . cholecalciferol (VITAMIN D) 1000 units tablet Take 1,000 Units by mouth 2 (two) times daily.  Marland Kitchen ELIQUIS 5 MG TABS tablet TAKE ONE TABLET TWICE DAILY  . fish oil-omega-3 fatty acids 1000 MG capsule Take 1 g by mouth 2 (two) times daily.   . furosemide (LASIX) 20 MG tablet TAKE ONE (1) TABLET EACH DAY  . losartan-hydrochlorothiazide (HYZAAR) 100-12.5 MG tablet TAKE 1 TABET BY MOUTH ONCE A DAY  . Menthol, Topical Analgesic, (BIOFREEZE EX) Apply 1 application topically daily as needed (pain).  . Misc Natural Products (OSTEO BI-FLEX JOINT SHIELD PO) Take 1 tablet by mouth 2 (two) times daily.   . Multiple Vitamin (MULTIVITAMIN WITH MINERALS) TABS tablet Take 0.5 tablets by mouth 2 (two) times daily.  . nitroGLYCERIN (NITROSTAT) 0.4 MG SL tablet Place 1 tablet (0.4 mg total) under the tongue every 5 (five) minutes as needed.  . pantoprazole (PROTONIX) 40 MG tablet TAKE ONE (1) TABLET EACH DAY  . Polyethyl Glycol-Propyl Glycol (SYSTANE OP) Place 1 drop into both eyes daily as needed (dry eyes).  Marland Kitchen PRESCRIPTION MEDICATION Inhale into the  lungs at bedtime. CPAP  . propranolol (INDERAL) 10 MG tablet Take 1 tablet (10 mg total) by mouth 3 (three) times daily.  . ranitidine (ZANTAC) 75 MG tablet Take 75 mg by mouth 2 (two) times daily.  . rosuvastatin (CRESTOR) 40 MG tablet TAKE ONE (1) TABLET EACH DAY  . traMADol (ULTRAM) 50 MG tablet Take 50 mg by mouth every  8 (eight) hours as needed.  . [DISCONTINUED] potassium chloride (K-DUR) 10 MEQ tablet TAKE ONE (1) TABLET EACH DAY AS NEEDED  . doxycycline (DORYX) 100 MG EC tablet Take 1 tablet (100 mg total) by mouth 2 (two) times daily.  . mupirocin ointment (BACTROBAN) 2 % Apply to affected area bid  . triamcinolone cream (KENALOG) 0.1 % Apply 1 application topically 2 (two) times daily. To affected area on leg  . [DISCONTINUED] doxycycline (DORYX) 100 MG EC tablet Take 1 tablet (100 mg total) by mouth 2 (two) times daily. (Patient not taking: Reported on 01/16/2018)   No facility-administered encounter medications on file as of 01/16/2018.     Review of Systems  Constitutional: Negative for appetite change and fever.  Respiratory: Negative for cough and chest tightness.        Breathing stable.    Cardiovascular: Positive for leg swelling. Negative for chest pain and palpitations.  Gastrointestinal: Negative for abdominal pain, diarrhea, nausea and vomiting.  Musculoskeletal: Negative for myalgias.  Skin: Negative for wound.       Some erythema - noted lower extremity.    Neurological: Negative for dizziness, light-headedness and headaches.  Psychiatric/Behavioral: Negative for agitation and dysphoric mood.       Objective:    Physical Exam  Constitutional: She appears well-developed and well-nourished. No distress.  HENT:  Nose: Nose normal.  Mouth/Throat: Oropharynx is clear and moist.  Neck: Neck supple. No thyromegaly present.  Cardiovascular: Normal rate and regular rhythm.  Pulmonary/Chest: Breath sounds normal. No respiratory distress. She has no wheezes.    Abdominal: Soft. Bowel sounds are normal. There is no tenderness.  Musculoskeletal:  Pedal and ankle and lower extremity swelling.  Some increased erythema.  Appears to be c/w venostasis changes and concern over possible cellulitis.    Lymphadenopathy:    She has no cervical adenopathy.  Skin: There is erythema.  Psychiatric: She has a normal mood and affect. Her behavior is normal.    BP 138/74 (BP Location: Left Arm, Patient Position: Sitting, Cuff Size: Large)   Pulse (!) 56   Temp 98 F (36.7 C) (Oral)   Resp 16   Wt 191 lb 4 oz (86.8 kg)   LMP 05/01/1972   SpO2 98%   BMI 38.63 kg/m  Wt Readings from Last 3 Encounters:  01/16/18 191 lb 4 oz (86.8 kg)  12/28/17 194 lb 6 oz (88.2 kg)  12/21/17 189 lb 12 oz (86.1 kg)     Lab Results  Component Value Date   WBC 6.1 12/07/2017   HGB 12.8 12/07/2017   HCT 38.3 12/07/2017   PLT 219.0 12/07/2017   GLUCOSE 108 (H) 12/07/2017   CHOL 149 12/07/2017   TRIG 78.0 12/07/2017   HDL 68.40 12/07/2017   LDLCALC 65 12/07/2017   ALT 37 (H) 12/07/2017   AST 33 12/07/2017   NA 145 12/07/2017   K 3.9 12/07/2017   CL 104 12/07/2017   CREATININE 1.13 12/07/2017   BUN 20 12/07/2017   CO2 34 (H) 12/07/2017   TSH 3.30 08/07/2017   INR 0.99 03/25/2016   HGBA1C 6.4 12/07/2017   MICROALBUR 2.5 (H) 09/06/2017    US Venous Img Lower Bilateral  Result Date: 12/28/2017 CLINICAL DATA:  77 year old female with bilateral lower extremity edema and left calf cellulitis EXAM: BILATERAL LOWER EXTREMITY VENOUS DOPPLER ULTRASOUND TECHNIQUE: Gray-scale sonography with graded compression, as well as color Doppler and duplex ultrasound were performed to evaluate the lower extremity deep venous systems from the  level of the common femoral vein and including the common femoral, femoral, profunda femoral, popliteal and calf veins including the posterior tibial, peroneal and gastrocnemius veins when visible. The superficial great saphenous vein was also  interrogated. Spectral Doppler was utilized to evaluate flow at rest and with distal augmentation maneuvers in the common femoral, femoral and popliteal veins. COMPARISON:  None. FINDINGS: RIGHT LOWER EXTREMITY Common Femoral Vein: No evidence of thrombus. Normal compressibility, respiratory phasicity and response to augmentation. Saphenofemoral Junction: No evidence of thrombus. Normal compressibility and flow on color Doppler imaging. Profunda Femoral Vein: No evidence of thrombus. Normal compressibility and flow on color Doppler imaging. Femoral Vein: No evidence of thrombus. Normal compressibility, respiratory phasicity and response to augmentation. Popliteal Vein: No evidence of thrombus. Normal compressibility, respiratory phasicity and response to augmentation. Calf Veins: No evidence of thrombus. Normal compressibility and flow on color Doppler imaging. Superficial Great Saphenous Vein: No evidence of thrombus. Normal compressibility. Venous Reflux:  None. Other Findings:  None. LEFT LOWER EXTREMITY Common Femoral Vein: No evidence of thrombus. Normal compressibility, respiratory phasicity and response to augmentation. Saphenofemoral Junction: No evidence of thrombus. Normal compressibility and flow on color Doppler imaging. Profunda Femoral Vein: No evidence of thrombus. Normal compressibility and flow on color Doppler imaging. Femoral Vein: No evidence of thrombus. Normal compressibility, respiratory phasicity and response to augmentation. Popliteal Vein: No evidence of thrombus. Normal compressibility, respiratory phasicity and response to augmentation. Calf Veins: No evidence of thrombus. Normal compressibility and flow on color Doppler imaging. Superficial Great Saphenous Vein: No evidence of thrombus. Normal compressibility. Venous Reflux:  None. Other Findings:  None. IMPRESSION: No evidence of deep venous thrombosis. Electronically Signed   By: Jacqulynn Cadet M.D.   On: 12/28/2017 15:57         Assessment & Plan:   Problem List Items Addressed This Visit    Coronary artery disease involving native coronary artery of native heart with unstable angina pectoris ([place])    No chest pain.  Continue risk factor modification.  Continue f/u with cardiology.       Diabetes mellitus ([place])    Low carb diet and exercise.  Follow met b and a1c.        Essential hypertension, benign (Chronic)    Blood pressure as outlined.  Same medication regimen.  Follow pressures.  Follow metabolic panel.       GERD (gastroesophageal reflux disease)    Controlled on current regimen.  Follow.       Hypercholesterolemia (Chronic)    On crestor.  Low cholesterol diet and exercise.  Follow lipid panel and liver function tests.        Lower extremity edema    Swelling as outlined.  Unable to wear regular compression hose.  With some increased erythema - consistent with venostasis changes and concern regarding possible cellulitis.  Extend doxycycline.  Probiotic with abx as directed.  Elevate legs.  Agree with vascular surgery evaluation.        Obstructive sleep apnea (Chronic)    Continue CPAP.       Paroxysmal atrial fibrillation (H[facility])    On eliquis.  Followed by cardiology.        Other Visit Diagnoses    Cellulitis of left lower extremity       Relevant Medications   doxycycline (DORYX) 100 MG EC tablet       Einar Pheasant, MD

## 2018-01-17 ENCOUNTER — Telehealth: Payer: Self-pay

## 2018-01-17 ENCOUNTER — Other Ambulatory Visit: Payer: Self-pay

## 2018-01-17 NOTE — Telephone Encounter (Signed)
Pharmacy called back

## 2018-01-17 NOTE — Telephone Encounter (Signed)
Patient is aware that abx have been approved

## 2018-01-17 NOTE — Telephone Encounter (Signed)
FYI

## 2018-01-17 NOTE — Telephone Encounter (Signed)
Called [facility] to give ok for patient to get the same abx that she was just on previously. The one that was sent in yesterday is $300.00. Left message for pharmacy to call back.

## 2018-01-17 NOTE — Telephone Encounter (Signed)
Almyra Free from [facility] calling to let staff know that pt's doxycycline (DORYX) 100 MG EC tablet has been approved through their system for one year 01/17/18 - 01/18/2019.  Almyra Free states pharmacy has been made aware and pt just needs to go pick this up.

## 2018-01-17 NOTE — Telephone Encounter (Signed)
April [name] is requesting a call back.  [place]

## 2018-01-19 ENCOUNTER — Encounter: Payer: Self-pay | Admitting: Internal Medicine

## 2018-01-19 DIAGNOSIS — R600 Localized edema: Secondary | ICD-10-CM | POA: Insufficient documentation

## 2018-01-19 NOTE — Assessment & Plan Note (Signed)
On crestor.  Low cholesterol diet and exercise.  Follow lipid panel and liver function tests.   

## 2018-01-19 NOTE — Assessment & Plan Note (Signed)
No chest pain.  Continue risk factor modification.  Continue f/u with cardiology.

## 2018-01-19 NOTE — Assessment & Plan Note (Signed)
Low carb diet and exercise.  Follow met b and a1c.   

## 2018-01-19 NOTE — Assessment & Plan Note (Signed)
Continue CPAP.  

## 2018-01-19 NOTE — Assessment & Plan Note (Signed)
On eliquis.  Followed by cardiology.

## 2018-01-19 NOTE — Assessment & Plan Note (Signed)
Swelling as outlined.  Unable to wear regular compression hose.  With some increased erythema - consistent with venostasis changes and concern regarding possible cellulitis.  Extend doxycycline.  Probiotic with abx as directed.  Elevate legs.  Agree with vascular surgery evaluation.

## 2018-01-19 NOTE — Assessment & Plan Note (Signed)
Controlled on current regimen.  Follow.  

## 2018-01-19 NOTE — Assessment & Plan Note (Signed)
Blood pressure as outlined.  Same medication regimen.  Follow pressures.  Follow metabolic panel.  

## 2018-02-04 DIAGNOSIS — I447 Left bundle-branch block, unspecified: Secondary | ICD-10-CM | POA: Diagnosis not present

## 2018-02-04 DIAGNOSIS — K449 Diaphragmatic hernia without obstruction or gangrene: Secondary | ICD-10-CM | POA: Diagnosis not present

## 2018-02-04 DIAGNOSIS — G4733 Obstructive sleep apnea (adult) (pediatric): Secondary | ICD-10-CM | POA: Diagnosis not present

## 2018-02-04 DIAGNOSIS — R918 Other nonspecific abnormal finding of lung field: Secondary | ICD-10-CM | POA: Diagnosis not present

## 2018-02-04 DIAGNOSIS — K219 Gastro-esophageal reflux disease without esophagitis: Secondary | ICD-10-CM | POA: Diagnosis not present

## 2018-02-04 DIAGNOSIS — I25119 Atherosclerotic heart disease of native coronary artery with unspecified angina pectoris: Secondary | ICD-10-CM | POA: Diagnosis not present

## 2018-02-04 DIAGNOSIS — I252 Old myocardial infarction: Secondary | ICD-10-CM | POA: Diagnosis not present

## 2018-02-04 DIAGNOSIS — I130 Hypertensive heart and chronic kidney disease with heart failure and stage 1 through stage 4 chronic kidney disease, or unspecified chronic kidney disease: Secondary | ICD-10-CM | POA: Diagnosis not present

## 2018-02-04 DIAGNOSIS — Z882 Allergy status to sulfonamides status: Secondary | ICD-10-CM | POA: Diagnosis not present

## 2018-02-04 DIAGNOSIS — Z9981 Dependence on supplemental oxygen: Secondary | ICD-10-CM | POA: Diagnosis not present

## 2018-02-04 DIAGNOSIS — Z880 Allergy status to penicillin: Secondary | ICD-10-CM | POA: Diagnosis not present

## 2018-02-04 DIAGNOSIS — Z743 Need for continuous supervision: Secondary | ICD-10-CM | POA: Diagnosis not present

## 2018-02-04 DIAGNOSIS — I160 Hypertensive urgency: Secondary | ICD-10-CM | POA: Diagnosis not present

## 2018-02-04 DIAGNOSIS — I5033 Acute on chronic diastolic (congestive) heart failure: Secondary | ICD-10-CM | POA: Diagnosis not present

## 2018-02-04 DIAGNOSIS — E1122 Type 2 diabetes mellitus with diabetic chronic kidney disease: Secondary | ICD-10-CM | POA: Diagnosis not present

## 2018-02-04 DIAGNOSIS — I480 Paroxysmal atrial fibrillation: Secondary | ICD-10-CM | POA: Diagnosis not present

## 2018-02-04 DIAGNOSIS — Z7901 Long term (current) use of anticoagulants: Secondary | ICD-10-CM | POA: Diagnosis not present

## 2018-02-04 DIAGNOSIS — N183 Chronic kidney disease, stage 3 (moderate): Secondary | ICD-10-CM | POA: Diagnosis not present

## 2018-02-04 DIAGNOSIS — R0789 Other chest pain: Secondary | ICD-10-CM | POA: Diagnosis not present

## 2018-02-04 DIAGNOSIS — E119 Type 2 diabetes mellitus without complications: Secondary | ICD-10-CM | POA: Diagnosis not present

## 2018-02-04 DIAGNOSIS — R079 Chest pain, unspecified: Secondary | ICD-10-CM | POA: Diagnosis not present

## 2018-02-04 DIAGNOSIS — I10 Essential (primary) hypertension: Secondary | ICD-10-CM | POA: Diagnosis not present

## 2018-02-04 DIAGNOSIS — E785 Hyperlipidemia, unspecified: Secondary | ICD-10-CM | POA: Diagnosis not present

## 2018-02-04 DIAGNOSIS — Z955 Presence of coronary angioplasty implant and graft: Secondary | ICD-10-CM | POA: Diagnosis not present

## 2018-02-04 DIAGNOSIS — R0602 Shortness of breath: Secondary | ICD-10-CM | POA: Diagnosis not present

## 2018-02-05 DIAGNOSIS — E785 Hyperlipidemia, unspecified: Secondary | ICD-10-CM | POA: Diagnosis not present

## 2018-02-05 DIAGNOSIS — I200 Unstable angina: Secondary | ICD-10-CM | POA: Diagnosis not present

## 2018-02-05 DIAGNOSIS — I10 Essential (primary) hypertension: Secondary | ICD-10-CM | POA: Diagnosis not present

## 2018-02-05 DIAGNOSIS — R079 Chest pain, unspecified: Secondary | ICD-10-CM | POA: Diagnosis not present

## 2018-02-05 DIAGNOSIS — R0789 Other chest pain: Secondary | ICD-10-CM | POA: Diagnosis not present

## 2018-02-05 DIAGNOSIS — I480 Paroxysmal atrial fibrillation: Secondary | ICD-10-CM | POA: Diagnosis not present

## 2018-02-05 DIAGNOSIS — I2510 Atherosclerotic heart disease of native coronary artery without angina pectoris: Secondary | ICD-10-CM | POA: Diagnosis not present

## 2018-02-05 DIAGNOSIS — I25119 Atherosclerotic heart disease of native coronary artery with unspecified angina pectoris: Secondary | ICD-10-CM | POA: Diagnosis not present

## 2018-02-06 DIAGNOSIS — I10 Essential (primary) hypertension: Secondary | ICD-10-CM | POA: Diagnosis not present

## 2018-02-06 DIAGNOSIS — E785 Hyperlipidemia, unspecified: Secondary | ICD-10-CM | POA: Diagnosis not present

## 2018-02-06 DIAGNOSIS — I200 Unstable angina: Secondary | ICD-10-CM | POA: Diagnosis not present

## 2018-02-06 DIAGNOSIS — I25119 Atherosclerotic heart disease of native coronary artery with unspecified angina pectoris: Secondary | ICD-10-CM | POA: Diagnosis not present

## 2018-02-06 DIAGNOSIS — R079 Chest pain, unspecified: Secondary | ICD-10-CM | POA: Diagnosis not present

## 2018-02-07 ENCOUNTER — Telehealth: Payer: Self-pay

## 2018-02-07 ENCOUNTER — Telehealth: Payer: Self-pay | Admitting: Cardiovascular Disease

## 2018-02-07 DIAGNOSIS — R079 Chest pain, unspecified: Secondary | ICD-10-CM | POA: Diagnosis not present

## 2018-02-07 DIAGNOSIS — I10 Essential (primary) hypertension: Secondary | ICD-10-CM | POA: Diagnosis not present

## 2018-02-07 DIAGNOSIS — I200 Unstable angina: Secondary | ICD-10-CM | POA: Diagnosis not present

## 2018-02-07 DIAGNOSIS — I480 Paroxysmal atrial fibrillation: Secondary | ICD-10-CM | POA: Diagnosis not present

## 2018-02-07 DIAGNOSIS — I2510 Atherosclerotic heart disease of native coronary artery without angina pectoris: Secondary | ICD-10-CM | POA: Diagnosis not present

## 2018-02-07 DIAGNOSIS — I25119 Atherosclerotic heart disease of native coronary artery with unspecified angina pectoris: Secondary | ICD-10-CM | POA: Diagnosis not present

## 2018-02-07 DIAGNOSIS — E785 Hyperlipidemia, unspecified: Secondary | ICD-10-CM | POA: Diagnosis not present

## 2018-02-07 MED ORDER — PANTOPRAZOLE SODIUM 40 MG PO TBEC
40.00 | DELAYED_RELEASE_TABLET | ORAL | Status: DC
Start: 2018-02-08 — End: 2018-02-07

## 2018-02-07 MED ORDER — HYDROMORPHONE HCL 2 MG/ML IJ SOLN
0.50 | INTRAMUSCULAR | Status: DC
Start: ? — End: 2018-02-07

## 2018-02-07 MED ORDER — GENERIC EXTERNAL MEDICATION
4.00 | Status: DC
Start: ? — End: 2018-02-07

## 2018-02-07 MED ORDER — GENERIC EXTERNAL MEDICATION
1.00 | Status: DC
Start: ? — End: 2018-02-07

## 2018-02-07 MED ORDER — PROPRANOLOL HCL 10 MG PO TABS
20.00 | ORAL_TABLET | ORAL | Status: DC
Start: 2018-02-07 — End: 2018-02-07

## 2018-02-07 MED ORDER — APIXABAN 5 MG PO TABS
5.00 | ORAL_TABLET | ORAL | Status: DC
Start: 2018-02-07 — End: 2018-02-07

## 2018-02-07 MED ORDER — POTASSIUM CHLORIDE ER 10 MEQ PO CPCR
10.00 | ORAL_CAPSULE | ORAL | Status: DC
Start: 2018-02-08 — End: 2018-02-07

## 2018-02-07 MED ORDER — INSULIN LISPRO 100 UNIT/ML (KWIKPEN)
.00 | PEN_INJECTOR | SUBCUTANEOUS | Status: DC
Start: 2018-02-07 — End: 2018-02-07

## 2018-02-07 MED ORDER — ROSUVASTATIN CALCIUM 10 MG PO TABS
40.00 | ORAL_TABLET | ORAL | Status: DC
Start: 2018-02-07 — End: 2018-02-07

## 2018-02-07 MED ORDER — GENERIC EXTERNAL MEDICATION
650.00 | Status: DC
Start: ? — End: 2018-02-07

## 2018-02-07 MED ORDER — DIPHENHYDRAMINE HCL 25 MG PO CAPS
25.00 | ORAL_CAPSULE | ORAL | Status: DC
Start: ? — End: 2018-02-07

## 2018-02-07 MED ORDER — NITROGLYCERIN 0.4 MG SL SUBL
0.40 | SUBLINGUAL_TABLET | SUBLINGUAL | Status: DC
Start: ? — End: 2018-02-07

## 2018-02-07 MED ORDER — SODIUM CHLORIDE 0.9 % IV SOLN
INTRAVENOUS | Status: DC
Start: ? — End: 2018-02-07

## 2018-02-07 MED ORDER — LOSARTAN POTASSIUM 50 MG PO TABS
100.00 | ORAL_TABLET | ORAL | Status: DC
Start: 2018-02-08 — End: 2018-02-07

## 2018-02-07 MED ORDER — FLUTICASONE FUROATE-VILANTEROL 100-25 MCG/INH IN AEPB
1.00 | INHALATION_SPRAY | RESPIRATORY_TRACT | Status: DC
Start: 2018-02-08 — End: 2018-02-07

## 2018-02-07 MED ORDER — DEXTROSE 50 % IV SOLN
12.50 | INTRAVENOUS | Status: DC
Start: ? — End: 2018-02-07

## 2018-02-07 MED ORDER — AMIODARONE HCL 200 MG PO TABS
200.00 | ORAL_TABLET | ORAL | Status: DC
Start: 2018-02-08 — End: 2018-02-07

## 2018-02-07 MED ORDER — SENNOSIDES-DOCUSATE SODIUM 8.6-50 MG PO TABS
1.00 | ORAL_TABLET | ORAL | Status: DC
Start: 2018-02-07 — End: 2018-02-07

## 2018-02-07 MED ORDER — POLYETHYLENE GLYCOL 3350 17 G PO PACK
17.00 | PACK | ORAL | Status: DC
Start: ? — End: 2018-02-07

## 2018-02-07 MED ORDER — FUROSEMIDE 40 MG PO TABS
40.00 | ORAL_TABLET | ORAL | Status: DC
Start: 2018-02-08 — End: 2018-02-07

## 2018-02-07 MED ORDER — GENERIC EXTERNAL MEDICATION
16.00 | Status: DC
Start: ? — End: 2018-02-07

## 2018-02-07 MED ORDER — AMLODIPINE BESYLATE 5 MG PO TABS
5.00 | ORAL_TABLET | ORAL | Status: DC
Start: 2018-02-08 — End: 2018-02-07

## 2018-02-07 NOTE — Telephone Encounter (Signed)
Called and spoke with patient and she reports having heart cath and that they wanted her to follow up with [place] to look at her site and also run some labs to check her kidneys. Reviewed that we could get her in on Monday and she was agreeable with that date and time. She was appreciative for the call back with no further questions at this time.

## 2018-02-07 NOTE — Telephone Encounter (Signed)
Patient calling States that she had a heart cath done at [facility] in [place] recently and was told to schedule a follow up appointment in 2 days Denied next available with Angelica Ran on 11/8 Patient also states she will need her creatinine lab checked  Please call to discuss

## 2018-02-07 NOTE — Telephone Encounter (Signed)
Copied from [place] [id number]. Topic: Quick Communication - See Telephone Encounter >> Feb 07, 2018 11:47 AM Hewitt Shorts wrote: Pt is being discharged from [facility] in [place] today and they are wanting the patient to have a hospital follow up tomorrow or no  Later than Monday -to review pt creatinine the provider in the hospital is Dr. Jenny Reichmann and and her cardiologsit is Dr. Lyndel Safe  Best number for pt [phone]

## 2018-02-11 ENCOUNTER — Ambulatory Visit (INDEPENDENT_AMBULATORY_CARE_PROVIDER_SITE_OTHER): Payer: Medicare Other | Admitting: Nurse Practitioner

## 2018-02-11 ENCOUNTER — Other Ambulatory Visit
Admission: RE | Admit: 2018-02-11 | Discharge: 2018-02-11 | Disposition: A | Discharge status: Home / Self Care | Payer: Medicare Other | Source: Ambulatory Visit | Attending: Nurse Practitioner | Admitting: Nurse Practitioner

## 2018-02-11 ENCOUNTER — Encounter: Payer: Self-pay | Admitting: Nurse Practitioner

## 2018-02-11 VITALS — BP 142/72 | HR 57 | Ht 59.0 in | Wt 189.7 lb

## 2018-02-11 DIAGNOSIS — I2510 Atherosclerotic heart disease of native coronary artery without angina pectoris: Secondary | ICD-10-CM | POA: Diagnosis not present

## 2018-02-11 DIAGNOSIS — I480 Paroxysmal atrial fibrillation: Secondary | ICD-10-CM | POA: Diagnosis not present

## 2018-02-11 DIAGNOSIS — I10 Essential (primary) hypertension: Secondary | ICD-10-CM

## 2018-02-11 DIAGNOSIS — I5032 Chronic diastolic (congestive) heart failure: Secondary | ICD-10-CM | POA: Diagnosis not present

## 2018-02-11 DIAGNOSIS — Z9861 Coronary angioplasty status: Secondary | ICD-10-CM | POA: Diagnosis not present

## 2018-02-11 DIAGNOSIS — E785 Hyperlipidemia, unspecified: Secondary | ICD-10-CM

## 2018-02-11 LAB — BASIC METABOLIC PANEL
Anion gap: 7 (ref 5–15)
BUN: 18 mg/dL (ref 8–23)
CHLORIDE: 106 mmol/L (ref 98–111)
CO2: 29 mmol/L (ref 22–32)
CREATININE: 1.15 mg/dL — AB (ref 0.44–1.00)
Calcium: 9.3 mg/dL (ref 8.9–10.3)
GFR calc non Af Amer: 44 mL/min — ABNORMAL LOW (ref >60)
GFR, EST AFRICAN AMERICAN: 51 mL/min — AB (ref >60)
GLUCOSE: 116 mg/dL — AB (ref 70–99)
Potassium: 4.4 mmol/L (ref 3.5–5.1)
SODIUM: 142 mmol/L (ref 135–145)

## 2018-02-11 MED ORDER — HYDRALAZINE HCL 10 MG PO TABS: 10.0000 mg | ORAL_TABLET | Freq: Three times a day (TID) | ORAL | 6 refills | Status: DC

## 2018-02-11 NOTE — Progress Notes (Signed)
Office Visit    Patient Name: April [name] Date of Encounter: 02/11/2018  Primary Care Provider:  Einar Pheasant, MD Primary Cardiologist:  Ida Rogue, MD  Chief Complaint    77 year old female with a history of CAD status post prior RCA and PDA stenting, diastolic dysfunction, hypertension, hyperlipidemia, paroxysmal atrial fibrillation on Eliquis and amiodarone, obesity, and sleep apnea, who presents for follow-up after recent hospitalization in [place], [place] for angina with mild troponin elevation.  Past Medical History    Past Medical History:  Diagnosis Date  . CAD S/P percutaneous coronary angioplasty    a. 07/2015 MV: No ischemia, EF 66%;  b. 03/2016 Inflat STEMI/PCI: LM nl, LAD 25d, RI nl, LCX nl, OM1/2 nl, RCA 80p (3.5x16 Promus Premier DES), 50p/m, 30d, RPDA 90 (2.5x12 Promu Premier DES); c. 01/2018 Cath ([place], [place]): Patent RCA stents->Med Rx.  . CKD (chronic kidney disease), stage III ([place])   . Diastolic dysfunction    a. 10/2013 Echo: EF 55-65%, Gr1 DD; b. 01/2018 Echo ([place], [place]): EF 55-60%, Gr2 DD, RVSP 50mmHg.  . Diverticulitis   . Endometriosis   . Hiatal hernia   . History of colon polyps 08/1994  . History of Migraines   . Hypercholesterolemia   . Hypertension   . LBBB (left bundle branch block)   . Osteoporosis   . PAF (paroxysmal atrial fibrillation) ([place])    a. 03/2016 Dx @ time of MI; b. CHA2DS2VASc = 5-->Eliquis.  . Rheumatic fever   . Sleep apnea    CPAP  . ST elevation myocardial infarction (STEMI) of inferolateral wall, initial episode of care ([place]) 03/25/2016  . Urine incontinence    Past Surgical History:  Procedure Laterality Date  . ABDOMINAL HYSTERECTOMY     ovaries not removed  . APPENDECTOMY     was removed during hysterectomy  . Breast biopsies     x2  . BREAST BIOPSY Bilateral "years ago"  . BREAST SURGERY Bilateral   . CARDIAC CATHETERIZATION  2011   moderate 40% RCA  disease  . CARDIAC CATHETERIZATION  01/2010   Dr. Gollan@[email or web address] : Only noted 40% RCA  . CARDIAC CATHETERIZATION N/A 03/25/2016   Procedure: Left Heart Cath and Coronary Angiography;  Surgeon: 04/07/2016, MD;  Location: [place] [facility] [facility];  Service: Cardiovascular;  Laterality: N/A;  . CARDIAC CATHETERIZATION N/A 03/25/2016   Procedure: Coronary Stent Intervention;  Surgeon: 04/07/2016, MD;  Location: [place] [facility];  Service: Cardiovascular;  Laterality: N/A;  . COLONOSCOPY  2013  . ESOPHAGOGASTRODUODENOSCOPY (EGD) WITH PROPOFOL N/A 03/17/2015   Procedure: ESOPHAGOGASTRODUODENOSCOPY (EGD) WITH PROPOFOL;  Surgeon: 03/30/2015, MD;  Location: [facility] ENDOSCOPY;  Service: Gastroenterology;  Laterality: N/A;  . NM MYOVIEW ([place] [facility])  07/2015   No evidence ischemia or infarction. EF 66%. Low risk  . TONSILLECTOMY    . TRANSTHORACIC ECHOCARDIOGRAM  10/2013   Normal LV size and function. EF 55-65%. GR 1 DD. Otherwise normal.    Allergies  Allergies  Allergen Reactions  . Penicillins Other (See Comments)    Okay to take amoxicillin/(pt does not recall what the reaction to penicillin was (27-58 years old)   . Sulfa Antibiotics Rash    History of Present Illness    77 year old female with the above complex past medical history including CAD status post inferolateral STEMI in November 2017 with stenting of the RCA and RPDA at that time.  She also has hypertension, hyperlipidemia, diastolic  dysfunction, paroxysmal atrial fibrillation on Eliquis and amiodarone, obesity, and sleep apnea.  She was recently hospitalized at [facility] in [place], [place] last week after she developed substernal chest discomfort and presented to the emergency department there.  Troponin was minimally elevated at 0.07.  She was admitted and seen by cardiology.  Echo cardia Phillip Heal showed normal LV function with grade 2 diastolic dysfunction.  She underwent diagnostic  catheterization which by discharge summary showed patent RCA stents.  There were apparently no targets for intervention.  Her medications were adjusted during hospitalization she was felt to have mild volume overload.  She had been on losartan HCTZ and this was discontinued in favor of Lasix and losartan by itself.  Blood pressures were erratic and propranolol was increased to 20 mill grams 3 times daily.  Finally, amlodipine 5 mg was added back to her regimen.  Since her discharge, she has had no recurrence of chest pain.  She has noticed some increase in lower extremity swelling ever since starting on amlodipine.  She has had similar responses to amlodipine in the past.  She is chronic dyspnea on exertion which is stable.  She denies PND, orthopnea, dizziness, syncope, or early satiety, or palpitations.  Home Medications    Prior to Admission medications   Medication Sig Start Date End Date Taking? Authorizing Provider  acetaminophen (TYLENOL) 500 MG tablet Take 500 mg by mouth daily as needed for headache (pain).   Yes [provider]  albuterol (PROVENTIL HFA;VENTOLIN HFA) 108 (90 Base) MCG/ACT inhaler Inhale 2 puffs into the lungs every 6 (six) hours as needed for wheezing or shortness of breath. 05/08/17  Yes Flora Lipps, MD  amiodarone (PACERONE) 200 MG tablet TAKE ONE (1) TABLET EACH DAY 01/14/18  Yes Gollan, Kathlene November, MD  azelastine (ASTELIN) 0.1 % nasal spray Place 2 sprays into both nostrils daily as needed for rhinitis. Use in each nostril as directed   Yes [provider]  BREO ELLIPTA 100-25 MCG/INH AEPB Inhale 1 puff into the lungs daily. 01/16/18  Yes Flora Lipps, MD  cholecalciferol (VITAMIN D) 1000 units tablet Take 1,000 Units by mouth 2 (two) times daily.   Yes [provider]  ELIQUIS 5 MG TABS tablet TAKE ONE TABLET TWICE DAILY 09/12/17  Yes Wellington Hampshire, MD  fish oil-omega-3 fatty acids 1000 MG capsule Take 1 g by mouth 2 (two) times daily.    Yes  [provider]  furosemide (LASIX) 20 MG tablet TAKE ONE (1) TABLET EACH DAY Patient taking differently: 40 mg.  01/14/18  Yes Gollan, Kathlene November, MD  losartan (COZAAR) 100 MG tablet  02/07/18  Yes [provider]  Menthol, Topical Analgesic, (BIOFREEZE EX) Apply 1 application topically daily as needed (pain).   Yes [provider]  Misc Natural Products (OSTEO BI-FLEX JOINT SHIELD PO) Take 1 tablet by mouth 2 (two) times daily.    Yes [provider]  Multiple Vitamin (MULTIVITAMIN WITH MINERALS) TABS tablet Take 0.5 tablets by mouth 2 (two) times daily.   Yes [provider]  mupirocin ointment (BACTROBAN) 2 % Apply to affected area bid 01/16/18  Yes Einar Pheasant, MD  nitroGLYCERIN (NITROSTAT) 0.4 MG SL tablet Place 1 tablet (0.4 mg total) under the tongue every 5 (five) minutes as needed. 12/21/17  Yes Minna Merritts, MD  pantoprazole (PROTONIX) 40 MG tablet TAKE ONE (1) TABLET EACH DAY 01/14/18  Yes Gollan, Kathlene November, MD  Polyethyl Glycol-Propyl Glycol (SYSTANE OP)  Place 1 drop into both eyes daily as needed (dry eyes).   Yes [provider]  potassium chloride (K-DUR) 10 MEQ tablet TAKE ONE (1) TABLET EACH DAY AS NEEDED 01/16/18  Yes Gollan, Kathlene November, MD  PRESCRIPTION MEDICATION Inhale into the lungs at bedtime. CPAP   Yes [provider]  propranolol (INDERAL) 20 MG tablet Take 20 mg by mouth 3 (three) times daily.   Yes [provider]  ranitidine (ZANTAC) 75 MG tablet Take 75 mg by mouth 2 (two) times daily.   Yes [provider]  rosuvastatin (CRESTOR) 40 MG tablet TAKE ONE (1) TABLET EACH DAY 01/14/18  Yes Gollan, Kathlene November, MD  rosuvastatin (CRESTOR) 40 MG tablet TAKE ONE (1) TABLET EACH DAY 01/16/18  Yes Gollan, Kathlene November, MD  traMADol (ULTRAM) 50 MG tablet Take 50 mg by mouth every 8 (eight) hours as needed.   Yes [provider]  triamcinolone cream (KENALOG) 0.1 % Apply 1 application topically 2  (two) times daily. To affected area on leg 01/16/18  Yes Einar Pheasant, MD    Review of Systems    Recent episode of chest discomfort with subsequent hospitalization at [facility] in [place], [place].  No recurrence of chest pain since then.  She has chronic dyspnea on exertion.  She has been having lower extreme swelling since being resumed on amlodipine.  She denies palpitations, PND, orthopnea, dizziness, syncope, or early satiety.  All other systems reviewed and are otherwise negative except as noted above.  Physical Exam    VS:  BP (!) 142/72 (BP Location: Left Arm, Patient Position: Sitting, Cuff Size: Normal)   Pulse (!) 57   Ht 4\' 11"  (1.499 m)   Wt 189 lb 10.4 oz (86 kg)   LMP 05/01/1972   BMI 38.30 kg/m  , BMI Body mass index is 38.3 kg/m. GEN: Well nourished, well developed, in no acute distress. HEENT: normal. Neck: Supple, no JVD, carotid bruits, or masses. Cardiac: RRR, no murmurs, rubs, or gallops. No clubbing, cyanosis, 1+ bilateral lower extremity edema.  Radials/DP/PT 2+ and equal bilaterally.  Right wrist catheterization site without bleeding, bruit, or hematoma. Respiratory:  Respirations regular and unlabored, clear to auscultation bilaterally. GI: Soft, nontender, nondistended, BS + x 4. MS: no deformity or atrophy. Skin: warm and dry, no rash. Neuro:  Strength and sensation are intact. Psych: Normal affect.  Accessory Clinical Findings    ECG personally reviewed by me today -sinus bradycardia, 57, first-degree AV block, left axis deviation, left bundle branch block, prior inferior and anteroseptal infarcts.  Prolonged QT - no acute changes.  Assessment & Plan    1.  Coronary artery disease: Status post recent hospitalization in the setting of unstable angina with minimal troponin elevation in [place], [place].  Diagnostic catheterization was performed and according discharge summary, this showed patent RCA stents  without any other significant disease.  She has had no recurrence of chest pain since her discharge.  She remains on beta-blocker, ARB, and statin therapy. No ASA in setting of eliquis.  2.  Chronic diastolic congestive heart failure: During recent hospitalization in [place], she was felt to be volume overloaded.  She was switched from HCTZ to Lasix.  I will follow-up a basic metabolic panel today.  With the exception of lower extremity edema, which she has noted since resuming amlodipine, volume looks good.  Blood pressure is elevated today in that setting, and in addition to discontinuing amlodipine, I will add  hydralazine 10 mg 3 times daily.  3.  Essential hypertension: As above, she has been having swelling since resuming amlodipine.  She had similar issues in the past.  I am stopping this.  I will place her on hydralazine 10 mg 3 times daily.  She is otherwise on ARB and beta-blocker therapy as well.  Bradycardia prevents further titration of beta-blocker.  We did consider switching from propranolol to carvedilol but I suspect that even with that switch, she would require an additional agent.  4.  Hyperlipidemia: LDL was 48 on October 8 with normal LFTs.  Continue statin therapy.  5.  Stage III chronic kidney disease: Post cath creatinine was 1.3 on October 10.  Following up basic metabolic panel today.  6.  PAF:  Maintaining sinus on amio.  [place] w/ eliquis.  7.  Disposition: Follow-up basic metabolic panel today.  Follow-up in clinic in 4 to 6 weeks or sooner if necessary.  Murray Hodgkins, NP 02/11/2018, 4:45 PM

## 2018-02-11 NOTE — Patient Instructions (Signed)
Medication Instructions:  - Your physician has recommended you make the following change in your medication:   1) STOP norvasc (amlodipine)  2) START hydralazine 10 mg- take 1 tablet by mouth THREE times a day  If you need a refill on your cardiac medications before your next appointment, please call your pharmacy.   Lab work: - Your physician recommends that you have lab work today: [place]- 1st desk on the right to check in  If you have labs (blood work) drawn today and your tests are completely normal, you will receive your results only by: Mar[facility] Kitchen MyChart Message (if you have MyChart) OR . A paper copy in the mail If you have any lab test that is abnormal or we need to change your treatment, we will call you to review the results.  Testing/Procedures: - none ordered  Follow-Up: At [facility], you and your health needs are our priority.  As part of our continuing mission to provide you with exceptional heart care, we have created designated Provider Care Teams.  These Care Teams include your primary Cardiologist (physician) and Advanced Practice Providers (APPs -  Physician Assistants and Nurse Practitioners) who all work together to provide you with the care you need, when you need it. You will need a follow up appointment in 4- 6 weeks.  You may see Ida Rogue, MD or one of the following Advanced Practice Providers on your designated Care Team:   Murray Hodgkins, NP Christell Faith, PA-C . Marrianne Mood, PA-C  Any Other Special Instructions Will Be Listed Below (If Applicable). - N/A

## 2018-02-12 NOTE — Telephone Encounter (Signed)
I just got this message where I was out of office, patient has been seen by cardiology since ED. Her next appt with you is 04/17/18. Would you like to see her sooner?

## 2018-02-13 NOTE — Telephone Encounter (Signed)
Patient aware and says she does not feel the need to be seen sooner but if she needs to she will let us know.

## 2018-02-13 NOTE — Telephone Encounter (Signed)
Reviewed chart.  She was evaluated by cardiology 02/11/18.  They rechecked creatinine.  Had improved from result at [facility]...  Cardiology following.  If pt needs to come in for earlier appt, can schedule.

## 2018-02-19 DIAGNOSIS — Z23 Encounter for immunization: Secondary | ICD-10-CM | POA: Diagnosis not present

## 2018-03-07 DIAGNOSIS — M549 Dorsalgia, unspecified: Secondary | ICD-10-CM | POA: Diagnosis not present

## 2018-03-07 DIAGNOSIS — M47816 Spondylosis without myelopathy or radiculopathy, lumbar region: Secondary | ICD-10-CM | POA: Diagnosis not present

## 2018-03-07 DIAGNOSIS — M79661 Pain in right lower leg: Secondary | ICD-10-CM | POA: Diagnosis not present

## 2018-03-07 DIAGNOSIS — M79662 Pain in left lower leg: Secondary | ICD-10-CM | POA: Diagnosis not present

## 2018-03-19 ENCOUNTER — Other Ambulatory Visit: Payer: Self-pay | Admitting: Cardiovascular Disease

## 2018-03-19 NOTE — Telephone Encounter (Signed)
Refill Request.  

## 2018-03-20 ENCOUNTER — Encounter: Payer: Self-pay | Admitting: Nurse Practitioner

## 2018-03-20 ENCOUNTER — Ambulatory Visit (INDEPENDENT_AMBULATORY_CARE_PROVIDER_SITE_OTHER): Payer: Medicare Other | Admitting: Nurse Practitioner

## 2018-03-20 VITALS — BP 150/70 | HR 58 | Ht 59.0 in | Wt 193.8 lb

## 2018-03-20 DIAGNOSIS — N183 Chronic kidney disease, stage 3 unspecified: Secondary | ICD-10-CM

## 2018-03-20 DIAGNOSIS — Z9861 Coronary angioplasty status: Secondary | ICD-10-CM

## 2018-03-20 DIAGNOSIS — I10 Essential (primary) hypertension: Secondary | ICD-10-CM | POA: Diagnosis not present

## 2018-03-20 DIAGNOSIS — I5033 Acute on chronic diastolic (congestive) heart failure: Secondary | ICD-10-CM | POA: Diagnosis not present

## 2018-03-20 DIAGNOSIS — I2510 Atherosclerotic heart disease of native coronary artery without angina pectoris: Secondary | ICD-10-CM | POA: Diagnosis not present

## 2018-03-20 DIAGNOSIS — E785 Hyperlipidemia, unspecified: Secondary | ICD-10-CM | POA: Diagnosis not present

## 2018-03-20 DIAGNOSIS — I480 Paroxysmal atrial fibrillation: Secondary | ICD-10-CM

## 2018-03-20 MED ORDER — FUROSEMIDE 40 MG PO TABS: 40.0000 mg | ORAL_TABLET | Freq: Every day | ORAL | 3 refills | Status: DC

## 2018-03-20 MED ORDER — POTASSIUM CHLORIDE ER 10 MEQ PO TBCR: 20.0000 meq | EXTENDED_RELEASE_TABLET | Freq: Every day | ORAL | 3 refills | Status: DC

## 2018-03-20 NOTE — Progress Notes (Signed)
Office Visit    Patient Name: April [name] Date of Encounter: 03/20/2018  Primary Care Provider:  Einar Pheasant, MD Primary Cardiologist:  Ida Rogue, MD  Chief Complaint    77 year old female with history of CAD status post prior RCA and PDA stenting, diastolic dysfunction, hypertension, hyperlipidemia, paroxysmal atrial fibrillation on Eliquis and amiodarone, obesity, and sleep apnea, who presents for one-month follow-up in the setting of dyspnea and edema.  Past Medical History    Past Medical History:  Diagnosis Date  . CAD S/P percutaneous coronary angioplasty    a. 07/2015 MV: No ischemia, EF 66%;  b. 03/2016 Inflat STEMI/PCI: LM nl, LAD 25d, RI nl, LCX nl, OM1/2 nl, RCA 80p (3.5x16 Promus Premier DES), 50p/m, 30d, RPDA 90 (2.5x12 Promu Premier DES); c. 01/2018 Cath ([place], [place]): Patent RCA stents->Med Rx.  . CKD (chronic kidney disease), stage III ([place])   . Diastolic dysfunction    a. 10/2013 Echo: EF 55-65%, Gr1 DD; b. 01/2018 Echo ([place], [place]): EF 55-60%, Gr2 DD, RVSP 73mmHg.  . Diverticulitis   . Endometriosis   . Hiatal hernia   . History of colon polyps 08/1994  . History of Migraines   . Hypercholesterolemia   . Hypertension   . LBBB (left bundle branch block)   . Osteoporosis   . PAF (paroxysmal atrial fibrillation) ([place])    a. 03/2016 Dx @ time of MI; b. CHA2DS2VASc = 5-->Eliquis.  . Rheumatic fever   . Sleep apnea    CPAP  . ST elevation myocardial infarction (STEMI) of inferolateral wall, initial episode of care ([place]) 03/25/2016  . Urine incontinence    Past Surgical History:  Procedure Laterality Date  . ABDOMINAL HYSTERECTOMY     ovaries not removed  . APPENDECTOMY     was removed during hysterectomy  . Breast biopsies     x2  . BREAST BIOPSY Bilateral "years ago"  . BREAST SURGERY Bilateral   . CARDIAC CATHETERIZATION  2011   moderate 40% RCA disease  . CARDIAC CATHETERIZATION  01/2010   Dr.  Gollan@[facility] : Only noted 40% RCA  . CARDIAC CATHETERIZATION N/A 03/25/2016   Procedure: Left Heart Cath and Coronary Angiography;  Surgeon: 04/07/2016, MD;  Location: [facility];  Service: Cardiovascular;  Laterality: N/A;  . CARDIAC CATHETERIZATION N/A 03/25/2016   Procedure: Coronary Stent Intervention;  Surgeon: 04/07/2016, MD;  Location: [facility];  Service: Cardiovascular;  Laterality: N/A;  . COLONOSCOPY  2013  . ESOPHAGOGASTRODUODENOSCOPY (EGD) WITH PROPOFOL N/A 03/17/2015   Procedure: ESOPHAGOGASTRODUODENOSCOPY (EGD) WITH PROPOFOL;  Surgeon: 03/30/2015, MD;  Location: [facility];  Service: Gastroenterology;  Laterality: N/A;  . NM MYOVIEW ([place] [facility])  07/2015   No evidence ischemia or infarction. EF 66%. Low risk  . TONSILLECTOMY    . TRANSTHORACIC ECHOCARDIOGRAM  10/2013   Normal LV size and function. EF 55-65%. GR 1 DD. Otherwise normal.    Allergies  Allergies  Allergen Reactions  . Penicillins Other (See Comments)    Okay to take amoxicillin/(pt does not recall what the reaction to penicillin was (72-81 years old)   . Sulfa Antibiotics Rash    History of Present Illness    77 year old female with the above complex past medical history including CAD status post inferolateral STEMI in November 2017 with stenting of the RCA and RPDA at that time.  She also has a history of hypertension, hyperlipidemia, diastolic dysfunction/HFpEF, paroxysmal atrial  fibrillation on Eliquis and amiodarone, obesity, and sleep apnea.  Earlier this fall, she was hospitalized at [facility] in [place], [place] after developing substernal chest discomfort.  Troponin was minimally elevated at 0.07.  She was admitted and seen by cardiology.  Echo showed normal LV function with grade 2 diastolic dysfunction.  Diagnostic catheterization showed patent RCA stents and there were apparently no targets for intervention.  I saw her in follow-up on  October 14, at which she reported increase in lower extremity swelling after she was placed back on amlodipine therapy during her hospitalization.  We stopped her amlodipine and started hydralazine 10 mg 3 times daily.  Shortly after that visit, she went and visited an aunt and spent 3 weeks there.  In that setting, she was not weighing herself daily or checking her blood pressures.  She says that she did notice increasing lower extremity swelling and shortness of breath during her stay and says that she ate very well.  Since returning home, she has noted a rise in her blood pressures and her weight is up 4 pounds since her last visit here.  She says her weight on her home scale was 191.  She continues to have dyspnea on exertion and also lower extremity edema but denies chest pain, palpitations, PND, orthopnea, dizziness, syncope, or early satiety.  Home Medications    Prior to Admission medications   Medication Sig Start Date End Date Taking? Authorizing Provider  acetaminophen (TYLENOL) 500 MG tablet Take 500 mg by mouth daily as needed for headache (pain).   Yes [provider]  albuterol (PROVENTIL HFA;VENTOLIN HFA) 108 (90 Base) MCG/ACT inhaler Inhale 2 puffs into the lungs every 6 (six) hours as needed for wheezing or shortness of breath. 05/08/17  Yes Flora Lipps, MD  amiodarone (PACERONE) 200 MG tablet TAKE ONE (1) TABLET EACH DAY 01/14/18  Yes Gollan, Kathlene November, MD  azelastine (ASTELIN) 0.1 % nasal spray Place 2 sprays into both nostrils daily as needed for rhinitis. Use in each nostril as directed   Yes [provider]  BREO ELLIPTA 100-25 MCG/INH AEPB Inhale 1 puff into the lungs daily. 01/16/18  Yes Flora Lipps, MD  cholecalciferol (VITAMIN D) 1000 units tablet Take 1,000 Units by mouth 2 (two) times daily.   Yes [provider]  ELIQUIS 5 MG TABS tablet TAKE ONE TABLET TWICE DAILY 03/20/18  Yes Wellington Hampshire, MD  fish oil-omega-3 fatty acids 1000 MG capsule  Take 1 g by mouth 2 (two) times daily.    Yes [provider]  furosemide (LASIX) 20 MG tablet TAKE ONE (1) TABLET EACH DAY 01/14/18  Yes Gollan, Kathlene November, MD  hydrALAZINE (APRESOLINE) 10 MG tablet Take 1 tablet (10 mg total) by mouth 3 (three) times daily. 02/11/18  Yes Theora Gianotti, NP  losartan (COZAAR) 100 MG tablet  02/07/18  Yes [provider]  Menthol, Topical Analgesic, (BIOFREEZE EX) Apply 1 application topically daily as needed (pain).   Yes [provider]  Misc Natural Products (OSTEO BI-FLEX JOINT SHIELD PO) Take 1 tablet by mouth 2 (two) times daily.    Yes [provider]  Multiple Vitamin (MULTIVITAMIN WITH MINERALS) TABS tablet Take 0.5 tablets by mouth 2 (two) times daily.   Yes [provider]  mupirocin ointment (BACTROBAN) 2 % Apply to affected area bid 01/16/18  Yes Einar Pheasant, MD  nitroGLYCERIN (NITROSTAT) 0.4 MG SL tablet Place 1 tablet (0.4 mg total) under the tongue  every 5 (five) minutes as needed. 12/21/17  Yes Minna Merritts, MD  pantoprazole (PROTONIX) 40 MG tablet TAKE ONE (1) TABLET EACH DAY 01/14/18  Yes Gollan, Kathlene November, MD  Polyethyl Glycol-Propyl Glycol (SYSTANE OP) Place 1 drop into both eyes daily as needed (dry eyes).   Yes [provider]  potassium chloride (K-DUR) 10 MEQ tablet TAKE ONE (1) TABLET EACH DAY AS NEEDED Patient taking differently: TAKE ONE (1) TABLET EACH DAY 01/16/18  Yes Gollan, Kathlene November, MD  PRESCRIPTION MEDICATION Inhale into the lungs at bedtime. CPAP   Yes [provider]  propranolol (INDERAL) 20 MG tablet Take 20 mg by mouth 3 (three) times daily.   Yes [provider]  ranitidine (ZANTAC) 75 MG tablet Take 75 mg by mouth 2 (two) times daily.   Yes [provider]  rosuvastatin (CRESTOR) 40 MG tablet TAKE ONE (1) TABLET EACH DAY 01/16/18  Yes Gollan, Kathlene November, MD  traMADol (ULTRAM) 50 MG tablet Take 50 mg by mouth every 8 (eight) hours as  needed.   Yes [provider]  triamcinolone cream (KENALOG) 0.1 % Apply 1 application topically 2 (two) times daily. To affected area on leg 01/16/18  Yes Einar Pheasant, MD    Review of Systems    Dyspnea on exertion and lower extremity swelling as outlined above..  All other systems reviewed and are otherwise negative except as noted above.  Physical Exam    VS:  BP (!) 150/70 (BP Location: Left Arm, Patient Position: Sitting, Cuff Size: Normal)   Pulse (!) 58   Ht 4\' 11"  (1.499 m)   Wt 193 lb 12 oz (87.9 kg)   LMP 05/01/1972   BMI 39.13 kg/m  , BMI Body mass index is 39.13 kg/m. GEN: Well nourished, well developed, in no acute distress. HEENT: normal. Neck: Supple, JVP approximately 12 cm, no carotid bruits, or masses. Cardiac: RRR, no murmurs, rubs, or gallops. No clubbing, cyanosis, 2+ bilateral lower extremity edema with chronic venous stasis changes.  Radials/DP/PT 2+ and equal bilaterally.  Respiratory:  Respirations regular and unlabored, clear to auscultation bilaterally. GI: Obese, semifirm, nontender, BS + x 4. MS: no deformity or atrophy. Skin: warm and dry, no rash. Neuro:  Strength and sensation are intact. Psych: Normal affect.  Accessory Clinical Findings    ECG personally reviewed by me today -sinus bradycardia, 58, first-degree AV block, left bundle branch block- no acute changes.  Assessment & Plan    1.  Acute on chronic diastolic congestive heart failure: Since her last visit, patient admits to some dietary indiscretion and increased salt intake as she was staying with an aunt who was cooking a lot.  Her weight is up 4 pounds since her last visit on our scales and she does have volume overload.  She also reports increasing dyspnea on exertion at home.  She is currently taking Lasix 20 mg daily.  I have asked her to increase her Lasix to 40 mg twice daily for the next 2 days and then 40 mill grams daily after that.  We will follow-up a basic metabolic  panel in 1 week and I will see her back in clinic in 1 month.  Pressure is somewhat elevated today and hopefully this will improve with diuresis.  She otherwise remains on hydralazine, ARB, and beta-blocker therapy.  2.  Coronary artery disease: Status post hospitalization in [facility] in October with catheterization revealing patent RCA stents per discharge summary.  She has not any recurrent  chest pain.  She remains on beta-blocker, ARB, and statin.  No aspirin in the setting of Eliquis.  3.  Essential hypertension: As above, pressure elevated in the setting of volume overload.  Diuresing.  That she has back home, she will follow her blood pressure and contact us if she continues to trend greater than 130, at which point we can look to titrate hydralazine further.  4.  Hyperlipidemia: LDL was 48 on October 8 with normal LFTs.  Continue statin therapy.  5.  Stage III chronic kidney disease: Renal function stable in October.  We will follow-up basic metabolic panel in 1 week in the setting of higher diuretic dosing.  6.  Paroxysmal atrial fibrillation: Maintaining sinus rhythm on amiodarone.  She is anticoagulated with Eliquis.  7.  Disposition: Follow-up basic metabolic panel in 1 week.  Follow-up in clinic in 1 month.   Murray Hodgkins, NP 03/20/2018, 12:56 PM

## 2018-03-20 NOTE — Patient Instructions (Addendum)
Medication Instructions:  1- Lasix (Furosemide) take 40 mg 2 times daily today and tomorrow.  Then,  2- Lasix (Furosemide) take 40 mg 1 time daily thereafter.   3- Take potassium chloride 2 tablets (20 mEq) by mouth daily.   If you need a refill on your cardiac medications before your next appointment, please call your pharmacy.   Lab work: Your physician recommends that you return for lab work in: in 1 week (BMET)  If you have labs (blood work) drawn today and your tests are completely normal, you will receive your results only by: Marland Kitchen MyChart Message (if you have MyChart) OR . A paper copy in the mail If you have any lab test that is abnormal or we need to change your treatment, we will call you to review the results.  Testing/Procedures: No new tests ordered   Follow-Up: At [facility], you and your health needs are our priority.  As part of our continuing mission to provide you with exceptional heart care, we have created designated Provider Care Teams.  These Care Teams include your primary Cardiologist (physician) and Advanced Practice Providers (APPs -  Physician Assistants and Nurse Practitioners) who all work together to provide you with the care you need, when you need it. You will need a follow up appointment in 1 months.   You may see Ida Rogue, MD or one of the following Advanced Practice Providers on your designated Care Team:   Murray Hodgkins, NP

## 2018-03-25 ENCOUNTER — Other Ambulatory Visit (INDEPENDENT_AMBULATORY_CARE_PROVIDER_SITE_OTHER): Payer: Medicare Other

## 2018-03-25 DIAGNOSIS — I480 Paroxysmal atrial fibrillation: Secondary | ICD-10-CM | POA: Diagnosis not present

## 2018-03-25 DIAGNOSIS — I5033 Acute on chronic diastolic (congestive) heart failure: Secondary | ICD-10-CM | POA: Diagnosis not present

## 2018-03-26 LAB — BASIC METABOLIC PANEL
BUN/Creatinine Ratio: 16 (ref 12–28)
BUN: 16 mg/dL (ref 8–27)
CHLORIDE: 104 mmol/L (ref 96–106)
CO2: 27 mmol/L (ref 20–29)
Calcium: 9.3 mg/dL (ref 8.7–10.3)
Creatinine, Ser: 1.01 mg/dL — ABNORMAL HIGH (ref 0.57–1.00)
GFR calc non Af Amer: 53 mL/min/{1.73_m2} — ABNORMAL LOW (ref >59)
GFR, EST AFRICAN AMERICAN: 61 mL/min/{1.73_m2} (ref >59)
Glucose: 143 mg/dL — ABNORMAL HIGH (ref 65–99)
Potassium: 4.2 mmol/L (ref 3.5–5.2)
SODIUM: 144 mmol/L (ref 134–144)

## 2018-03-27 ENCOUNTER — Other Ambulatory Visit: Payer: Self-pay | Admitting: *Deleted

## 2018-03-27 ENCOUNTER — Other Ambulatory Visit: Payer: Medicare Other

## 2018-03-27 ENCOUNTER — Other Ambulatory Visit: Payer: Self-pay | Admitting: Cardiovascular Disease

## 2018-03-27 MED ORDER — LOSARTAN POTASSIUM 100 MG PO TABS: 100.0000 mg | ORAL_TABLET | Freq: Every day | ORAL | 1 refills | Status: DC

## 2018-03-27 NOTE — Telephone Encounter (Signed)
Pt currently taking Losartan 100 mg tablet daily. Pt was seen @ [facility] 02/04/2018. New Rx sent in to local pharmacy with updated Medication.

## 2018-03-27 NOTE — Telephone Encounter (Signed)
Please advise If ok to send in new Rx for Losartan 100 mg tablet daily.  Pt is taking only Losartan not Losartan-HCTZ.

## 2018-04-17 ENCOUNTER — Other Ambulatory Visit (INDEPENDENT_AMBULATORY_CARE_PROVIDER_SITE_OTHER): Payer: Medicare Other

## 2018-04-17 DIAGNOSIS — E1159 Type 2 diabetes mellitus with other circulatory complications: Secondary | ICD-10-CM

## 2018-04-17 DIAGNOSIS — E7800 Pure hypercholesterolemia, unspecified: Secondary | ICD-10-CM | POA: Diagnosis not present

## 2018-04-17 LAB — LIPID PANEL
CHOLESTEROL: 143 mg/dL (ref 0–200)
HDL: 71.8 mg/dL (ref >39.00)
LDL Cholesterol: 53 mg/dL (ref 0–99)
NonHDL: 71.32
Total CHOL/HDL Ratio: 2
Triglycerides: 92 mg/dL (ref 0.0–149.0)
VLDL: 18.4 mg/dL (ref 0.0–40.0)

## 2018-04-17 LAB — HEPATIC FUNCTION PANEL
ALT: 25 U/L (ref 0–35)
AST: 32 U/L (ref 0–37)
Albumin: 4 g/dL (ref 3.5–5.2)
Alkaline Phosphatase: 57 U/L (ref 39–117)
Bilirubin, Direct: 0.1 mg/dL (ref 0.0–0.3)
Total Bilirubin: 0.5 mg/dL (ref 0.2–1.2)
Total Protein: 6.5 g/dL (ref 6.0–8.3)

## 2018-04-17 LAB — BASIC METABOLIC PANEL
BUN: 14 mg/dL (ref 6–23)
CO2: 31 mEq/L (ref 19–32)
Calcium: 9.2 mg/dL (ref 8.4–10.5)
Chloride: 106 mEq/L (ref 96–112)
Creatinine, Ser: 1.13 mg/dL (ref 0.40–1.20)
GFR: 49.31 mL/min — ABNORMAL LOW (ref >60.00)
Glucose, Bld: 108 mg/dL — ABNORMAL HIGH (ref 70–99)
Potassium: 4.5 mEq/L (ref 3.5–5.1)
Sodium: 145 mEq/L (ref 135–145)

## 2018-04-17 LAB — HEMOGLOBIN A1C: Hgb A1c MFr Bld: 6.1 % (ref 4.6–6.5)

## 2018-04-19 ENCOUNTER — Encounter: Payer: Self-pay | Admitting: Internal Medicine

## 2018-04-19 ENCOUNTER — Ambulatory Visit (INDEPENDENT_AMBULATORY_CARE_PROVIDER_SITE_OTHER): Payer: Medicare Other | Admitting: Nurse Practitioner

## 2018-04-19 ENCOUNTER — Encounter: Payer: Self-pay | Admitting: Nurse Practitioner

## 2018-04-19 ENCOUNTER — Ambulatory Visit (INDEPENDENT_AMBULATORY_CARE_PROVIDER_SITE_OTHER): Payer: Medicare Other | Admitting: Internal Medicine

## 2018-04-19 VITALS — BP 140/78 | HR 58 | Temp 97.8°F | Resp 18 | Ht 59.0 in | Wt 191.2 lb

## 2018-04-19 VITALS — BP 160/64 | HR 54 | Ht 59.0 in | Wt 191.0 lb

## 2018-04-19 DIAGNOSIS — I10 Essential (primary) hypertension: Secondary | ICD-10-CM

## 2018-04-19 DIAGNOSIS — Z9861 Coronary angioplasty status: Secondary | ICD-10-CM | POA: Diagnosis not present

## 2018-04-19 DIAGNOSIS — G4733 Obstructive sleep apnea (adult) (pediatric): Secondary | ICD-10-CM | POA: Diagnosis not present

## 2018-04-19 DIAGNOSIS — I5033 Acute on chronic diastolic (congestive) heart failure: Secondary | ICD-10-CM

## 2018-04-19 DIAGNOSIS — I480 Paroxysmal atrial fibrillation: Secondary | ICD-10-CM

## 2018-04-19 DIAGNOSIS — I2510 Atherosclerotic heart disease of native coronary artery without angina pectoris: Secondary | ICD-10-CM

## 2018-04-19 DIAGNOSIS — E1159 Type 2 diabetes mellitus with other circulatory complications: Secondary | ICD-10-CM | POA: Diagnosis not present

## 2018-04-19 DIAGNOSIS — K219 Gastro-esophageal reflux disease without esophagitis: Secondary | ICD-10-CM | POA: Diagnosis not present

## 2018-04-19 DIAGNOSIS — Z0000 Encounter for general adult medical examination without abnormal findings: Secondary | ICD-10-CM

## 2018-04-19 DIAGNOSIS — E7800 Pure hypercholesterolemia, unspecified: Secondary | ICD-10-CM

## 2018-04-19 DIAGNOSIS — N183 Chronic kidney disease, stage 3 unspecified: Secondary | ICD-10-CM

## 2018-04-19 DIAGNOSIS — I25110 Atherosclerotic heart disease of native coronary artery with unstable angina pectoris: Secondary | ICD-10-CM | POA: Diagnosis not present

## 2018-04-19 DIAGNOSIS — R0602 Shortness of breath: Secondary | ICD-10-CM | POA: Diagnosis not present

## 2018-04-19 DIAGNOSIS — E785 Hyperlipidemia, unspecified: Secondary | ICD-10-CM

## 2018-04-19 DIAGNOSIS — R600 Localized edema: Secondary | ICD-10-CM | POA: Diagnosis not present

## 2018-04-19 DIAGNOSIS — Z1211 Encounter for screening for malignant neoplasm of colon: Secondary | ICD-10-CM

## 2018-04-19 MED ORDER — FUROSEMIDE 40 MG PO TABS: 40.0000 mg | ORAL_TABLET | Freq: Two times a day (BID) | ORAL | 3 refills | Status: DC

## 2018-04-19 NOTE — Progress Notes (Signed)
Office Visit    Patient Name: April [name] Date of Encounter: 04/19/2018  Primary Care Provider:  Einar Pheasant, MD Primary Cardiologist:  Ida Rogue, MD  Chief Complaint    77 y/o ? with a history of CAD status post prior RCA and PDA stenting, HFpEF, hypertension, hyperlipidemia, paroxysmal atrial fibrillation on Eliquis and amiodarone, obesity, and sleep apnea, who presents for one-month follow-up in the setting of dyspnea and edema.  Past Medical History    Past Medical History:  Diagnosis Date  . CAD S/P percutaneous coronary angioplasty    a. 07/2015 MV: No ischemia, EF 66%;  b. 03/2016 Inflat STEMI/PCI: LM nl, LAD 25d, RI nl, LCX nl, OM1/2 nl, RCA 80p (3.5x16 Promus Premier DES), 50p/m, 30d, RPDA 90 (2.5x12 Promu Premier DES); c. 01/2018 Cath ([place], [place]): Patent RCA stents->Med Rx.  . CKD (chronic kidney disease), stage III ([place])   . Diastolic dysfunction    a. 10/2013 Echo: EF 55-65%, Gr1 DD; b. 01/2018 Echo ([place], [place]): EF 55-60%, Gr2 DD, RVSP 41mmHg.  . Diverticulitis   . Endometriosis   . Hiatal hernia   . History of colon polyps 08/1994  . History of Migraines   . Hypercholesterolemia   . Hypertension   . LBBB (left bundle branch block)   . Osteoporosis   . PAF (paroxysmal atrial fibrillation) ([place])    a. 03/2016 Dx @ time of MI; b. CHA2DS2VASc = 5-->Eliquis.  . Rheumatic fever   . Sleep apnea    CPAP  . ST elevation myocardial infarction (STEMI) of inferolateral wall, initial episode of care ([place]) 03/25/2016  . Urine incontinence    Past Surgical History:  Procedure Laterality Date  . ABDOMINAL HYSTERECTOMY     ovaries not removed  . APPENDECTOMY     was removed during hysterectomy  . Breast biopsies     x2  . BREAST BIOPSY Bilateral "years ago"  . BREAST SURGERY Bilateral   . CARDIAC CATHETERIZATION  2011   moderate 40% RCA disease  . CARDIAC CATHETERIZATION  01/2010   Dr. Gollan@[facility] : Only noted 40%  RCA  . CARDIAC CATHETERIZATION N/A 03/25/2016   Procedure: Left Heart Cath and Coronary Angiography;  Surgeon: 04/07/2016, MD;  Location: [facility];  Service: Cardiovascular;  Laterality: N/A;  . CARDIAC CATHETERIZATION N/A 03/25/2016   Procedure: Coronary Stent Intervention;  Surgeon: 04/07/2016, MD;  Location: [facility];  Service: Cardiovascular;  Laterality: N/A;  . COLONOSCOPY  2013  . ESOPHAGOGASTRODUODENOSCOPY (EGD) WITH PROPOFOL N/A 03/17/2015   Procedure: ESOPHAGOGASTRODUODENOSCOPY (EGD) WITH PROPOFOL;  Surgeon: 03/30/2015, MD;  Location: [facility];  Service: Gastroenterology;  Laterality: N/A;  . NM MYOVIEW ([place] HX)  07/2015   No evidence ischemia or infarction. EF 66%. Low risk  . TONSILLECTOMY    . TRANSTHORACIC ECHOCARDIOGRAM  10/2013   Normal LV size and function. EF 55-65%. GR 1 DD. Otherwise normal.    Allergies  Allergies  Allergen Reactions  . Penicillins Other (See Comments)    Okay to take amoxicillin/(pt does not recall what the reaction to penicillin was (66-94 years old)   . Sulfa Antibiotics Rash    History of Present Illness    77 year old female with the above complex past medical history including CAD status post inferolateral STEMI in November 2017 with stenting the RCA and RPDA at that time.  She also has a history of hypertension, hyperlipidemia, HFpEF, paroxysmal atrial fibrillation  on Eliquis and amiodarone, obesity, and sleep apnea.  In October, she was admitted to [facility] in [place] with substernal chest discomfort and mild troponin elevation.  Echo showed normal LV function with grade 2 diastolic dysfunction.  Diagnostic catheterization showed patent RCA stents and otherwise no targets for intervention.  She has been seen several times since then secondary to an increase in lower extremity swelling that initially occurred after being placed back on amlodipine therapy during her  hospitalization.  This was discontinued and she was placed on hydralazine.  When she was last seen on November 20, she reported ongoing lower extremity swelling and dyspnea on exertion in the setting of salt indiscretion.  I asked her to increase Lasix to 40 mg twice daily x2 days and then to take 40 mg daily.  Follow-up blood work in late November showed stable renal function and potassium.  Her weight is down 2 pounds today, though she still has lower extremity edema, which she says was not present prior to her hospitalization in [place].  She does spend a fair amount of her time with her legs in a dependent position.  She is not able to use compression socks because she has difficulty getting them on.  She is careful with her salt intake.  She also notes dyspnea on exertion, which she says has been present for many years but is slightly worse over the past few months.  She has not had any chest pain and denies palpitations, PND, orthopnea, dizziness, syncope, or early satiety.  Home Medications    Prior to Admission medications   Medication Sig Start Date End Date Taking? Authorizing Provider  acetaminophen (TYLENOL) 500 MG tablet Take 500 mg by mouth daily as needed for headache (pain).   Yes [provider]  albuterol (PROVENTIL HFA;VENTOLIN HFA) 108 (90 Base) MCG/ACT inhaler Inhale 2 puffs into the lungs every 6 (six) hours as needed for wheezing or shortness of breath. 05/08/17  Yes Flora Lipps, MD  amiodarone (PACERONE) 200 MG tablet TAKE ONE (1) TABLET EACH DAY 01/14/18  Yes Gollan, Kathlene November, MD  azelastine (ASTELIN) 0.1 % nasal spray Place 2 sprays into both nostrils daily as needed for rhinitis. Use in each nostril as directed   Yes [provider]  BREO ELLIPTA 100-25 MCG/INH AEPB Inhale 1 puff into the lungs daily. 01/16/18  Yes Flora Lipps, MD  cholecalciferol (VITAMIN D) 1000 units tablet Take 1,000 Units by mouth 2 (two) times daily.   Yes [provider]    ELIQUIS 5 MG TABS tablet TAKE ONE TABLET TWICE DAILY 03/20/18  Yes Wellington Hampshire, MD  fish oil-omega-3 fatty acids 1000 MG capsule Take 1 g by mouth 2 (two) times daily.    Yes [provider]  furosemide (LASIX) 40 MG tablet Take 1 tablet (40 mg total) by mouth daily. 03/20/18  Yes Theora Gianotti, NP  hydrALAZINE (APRESOLINE) 10 MG tablet Take 1 tablet (10 mg total) by mouth 3 (three) times daily. 02/11/18  Yes Theora Gianotti, NP  losartan (COZAAR) 100 MG tablet Take 1 tablet (100 mg total) by mouth daily. 03/27/18  Yes Gollan, Kathlene November, MD  Menthol, Topical Analgesic, (BIOFREEZE EX) Apply 1 application topically daily as needed (pain).   Yes [provider]  Misc Natural Products (OSTEO BI-FLEX JOINT SHIELD PO) Take 1 tablet by mouth 2 (two) times daily.    Yes [provider]  Multiple Vitamin (MULTIVITAMIN WITH MINERALS) TABS tablet Take 0.5  tablets by mouth 2 (two) times daily.   Yes [provider]  nitroGLYCERIN (NITROSTAT) 0.4 MG SL tablet Place 1 tablet (0.4 mg total) under the tongue every 5 (five) minutes as needed. 12/21/17  Yes Minna Merritts, MD  pantoprazole (PROTONIX) 40 MG tablet TAKE ONE (1) TABLET EACH DAY 01/14/18  Yes Gollan, Kathlene November, MD  Polyethyl Glycol-Propyl Glycol (SYSTANE OP) Place 1 drop into both eyes daily as needed (dry eyes).   Yes [provider]  potassium chloride (K-DUR) 10 MEQ tablet Take 2 tablets (20 mEq total) by mouth daily. 03/20/18  Yes Theora Gianotti, NP  PRESCRIPTION MEDICATION Inhale into the lungs at bedtime. CPAP   Yes [provider]  propranolol (INDERAL) 20 MG tablet Take 20 mg by mouth 3 (three) times daily.   Yes [provider]  rosuvastatin (CRESTOR) 40 MG tablet TAKE ONE (1) TABLET EACH DAY 01/16/18  Yes Gollan, Kathlene November, MD  traMADol (ULTRAM) 50 MG tablet Take 50 mg by mouth every 8 (eight) hours as needed.   Yes [provider]   triamcinolone cream (KENALOG) 0.1 % Apply 1 application topically 2 (two) times daily. To affected area on leg 01/16/18  Yes Einar Pheasant, MD    Review of Systems    Dyspnea on exertion and lower extremity swelling as outlined above.  She denies chest pain, palpitations, pnd, orthopnea, n, v, dizziness, syncope, weight gain, or early satiety.  All other systems reviewed and are otherwise negative except as noted above.  Physical Exam    VS:  BP (!) 160/64 (BP Location: Left Arm, Patient Position: Sitting, Cuff Size: Normal)   Pulse (!) 54   Ht 4\' 11"  (1.499 m)   Wt 191 lb (86.6 kg)   LMP 05/01/1972   BMI 38.58 kg/m  , BMI Body mass index is 38.58 kg/m. GEN: Well nourished, well developed, in no acute distress. HEENT: normal. Neck: Supple, no JVD, carotid bruits, or masses. Cardiac: RRR, no murmurs, rubs, or gallops. No clubbing, cyanosis.  1+ bilateral ankle edema.  Radials/DP/PT 2+ and equal bilaterally.  Respiratory:  Respirations regular and unlabored, clear to auscultation bilaterally. GI: Soft, nontender, nondistended, BS + x 4. MS: no deformity or atrophy. Skin: warm and dry, no rash. Neuro:  Strength and sensation are intact. Psych: Normal affect.  Accessory Clinical Findings     Lab Results  Component Value Date   CREATININE 1.13 04/17/2018   BUN 14 04/17/2018   NA 145 04/17/2018   K 4.5 04/17/2018   CL 106 04/17/2018   CO2 31 04/17/2018    Assessment & Plan    1.  Acute on chronic diastolic CHF: At her last visit, patient continued to have lower extremity swelling despite discontinuation of amlodipine and her weight was up 4 pounds.  At that time, she reported dietary indiscretions with increase in sodium intake.  I increased her Lasix to 40 mg twice daily x2 days and then 40 mg daily.  Her weight is down 2 pounds.  She continues to have lower extremity swelling but also notes that she spends much of her day with her legs in a dependent position.  She has tried  compression stockings in the past but is unable to get them on.  She continues to have lower extremity edema on examination.  Her lungs are clear though she does note some worsening of dyspnea recently.  I will increase her Lasix to 40 mg twice daily and plan on follow-up basic  metabolic panel in 1 week.  Blood pressure is elevated today and hopefully with some diuresis, this will come down.  She otherwise remains on hydralazine, losartan, and propranolol.  2.  Coronary artery disease: Status post hospitalization in [facility] in October with catheterization revealing patent RCA stents.  She has not any recurrent chest pain.  She remains on beta-blocker, ARB, and statin.  No aspirin in the setting of Eliquis.  3.  Essential hypertension: Blood pressure elevated today in the setting of some amount of volume overload.  Adjusting Lasix dose as above.  May need to titrate hydralazine further.  4.  Hyperlipidemia: LDL was 48 on October 8 with normal LFTs.  Continue statin therapy.  5.  Stage III chronic kidney disease: Renal function recently stable.  We will follow-up basic metabolic panel in the setting of higher diuretic dosing.  6.  Paroxysmal atrial fibrillation: She is regular on examination today.  She remains on amiodarone, beta-blocker, and Eliquis.  7.  Disposition: Follow-up basic metabolic panel in 1 week.  Follow-up with Dr. Rockey Situ in 4 to 6 weeks.   Murray Hodgkins, NP 04/19/2018, 11:33 AM

## 2018-04-19 NOTE — Progress Notes (Signed)
Patient ID: April [name], female   DOB: 1940-06-27, 77 y.o.   MRN: [id number]   Subjective:    Patient ID: April [name], female    DOB: [birth year], 77 y.o.   MRN: [id number]  HPI  Patient with past history of CAD, hypertension and hypercholesterolemia.  she comes in today to follow up on these issues as well as for a complete physical exam.  She has seen cardiology for lower extremity swelling.  Worsened recently.  Evaluated today.  Lasix increased to bid.  Discussed compression hose.  She has tried them previously.  Unable to get them on.  Discussed vascular evaluation.  She is agreeable.  No chest pain. Breathing stable.  No acid reflux.  No abdominal pain.  Bowels moving.  States due colonoscopy.     Past Medical History:  Diagnosis Date  . CAD S/P percutaneous coronary angioplasty    a. 07/2015 MV: No ischemia, EF 66%;  b. 03/2016 Inflat STEMI/PCI: LM nl, LAD 25d, RI nl, LCX nl, OM1/2 nl, RCA 80p (3.5x16 Promus Premier DES), 50p/m, 30d, RPDA 90 (2.5x12 Promu Premier DES); c. 01/2018 Cath ([place], [place]): Patent RCA stents->Med Rx.  . CKD (chronic kidney disease), stage III ([place])   . Diastolic dysfunction    a. 10/2013 Echo: EF 55-65%, Gr1 DD; b. 01/2018 Echo ([place], [place]): EF 55-60%, Gr2 DD, RVSP 38mHg.  . Diverticulitis   . Endometriosis   . Hiatal hernia   . History of colon polyps 08/1994  . History of Migraines   . Hypercholesterolemia   . Hypertension   . LBBB (left bundle branch block)   . Osteoporosis   . PAF (paroxysmal atrial fibrillation) ([facility]    a. 03/2016 Dx @ time of MI; b. CHA2DS2VASc = 5-->Eliquis.  . Rheumatic fever   . Sleep apnea    CPAP  . ST elevation myocardial infarction (STEMI) of inferolateral wall, initial episode of care ([facility] 03/25/2016  . Urine incontinence    Past Surgical History:  Procedure Laterality Date  . ABDOMINAL HYSTERECTOMY     ovaries not removed  . APPENDECTOMY     was removed during hysterectomy    . Breast biopsies     x2  . BREAST BIOPSY Bilateral "years ago"  . BREAST SURGERY Bilateral   . CARDIAC CATHETERIZATION  2011   moderate 40% RCA disease  . CARDIAC CATHETERIZATION  01/2010   Dr. Gollan@[email or web address] : Only noted 40% RCA  . CARDIAC CATHETERIZATION N/A 03/25/2016   Procedure: Left Heart Cath and Coronary Angiography;  Surgeon: D12/11/2015 MD;  Location[facility];  Service: Cardiovascular;  Laterality: N/A;  . CARDIAC CATHETERIZATION N/A 03/25/2016   Procedure: Coronary Stent Intervention;  Surgeon: D12/11/2015 MD;  [facility];  Service: Cardiovascular;  Laterality: N/A;  . COLONOSCOPY  2013  . ESOPHAGOGASTRODUODENOSCOPY (EGD) WITH PROPOFOL N/A 03/17/2015   Procedure: ESOPHAGOGASTRODUODENOSCOPY (EGD) WITH PROPOFOL;  Surgeon: S11/29/2016 MD;  Location: [facility]COPY;  Service: Gastroenterology;  Laterality: N/A;  . NM MYOVIEW ([place])  07/2015   No evidence ischemia or infarction. EF 66%. Low risk  . TONSILLECTOMY    . TRANSTHORACIC ECHOCARDIOGRAM  10/2013   Normal LV size and function. EF 55-65%. GR 1 DD. Otherwise normal.   Family History  Problem Relation Age of Onset  . Arthritis Mother   . Stroke Mother   . Hypertension Mother   . Osteoporosis Mother   . Heart disease  Father        MI  . Heart attack Father   . Stroke Brother   . Heart attack Sister   . Breast cancer Other        first cousin x 2  . Stroke Daughter   . Heart attack Daughter   . Colon cancer Neg Hx    Social History   Socioeconomic History  . Marital status: Widowed    Spouse name: Not on file  . Number of children: 2  . Years of education: Not on file  . Highest education level: Not on file  Occupational History  . Not on file  Social Needs  . Financial resource strain: Not on file  . Food insecurity:    Worry: Not on file    Inability: Not on file  . Transportation needs:    Medical: Not on file    Non-medical: Not on file  Tobacco  Use  . Smoking status: Never Smoker  . Smokeless tobacco: Never Used  Substance and Sexual Activity  . Alcohol use: No    Alcohol/week: 0.0 standard drinks  . Drug use: No  . Sexual activity: Never  Lifestyle  . Physical activity:    Days per week: Not on file    Minutes per session: Not on file  . Stress: Not on file  Relationships  . Social connections:    Talks on phone: Not on file    Gets together: Not on file    Attends religious service: Not on file    Active member of club or organization: Not on file    Attends meetings of clubs or organizations: Not on file    Relationship status: Not on file  Other Topics Concern  . Not on file  Social History Narrative  . Not on file    Outpatient Encounter Medications as of 04/19/2018  Medication Sig  . acetaminophen (TYLENOL) 500 MG tablet Take 500 mg by mouth daily as needed for headache (pain).  Marland Kitchen azelastine (ASTELIN) 0.1 % nasal spray Place 2 sprays into both nostrils daily as needed for rhinitis. Use in each nostril as directed  . cholecalciferol (VITAMIN D) 1000 units tablet Take 1,000 Units by mouth 2 (two) times daily.  . fish oil-omega-3 fatty acids 1000 MG capsule Take 1 g by mouth 2 (two) times daily.   . Menthol, Topical Analgesic, (BIOFREEZE EX) Apply 1 application topically daily as needed (pain).  . Misc Natural Products (OSTEO BI-FLEX JOINT SHIELD PO) Take 1 tablet by mouth 2 (two) times daily.   . Multiple Vitamin (MULTIVITAMIN WITH MINERALS) TABS tablet Take 0.5 tablets by mouth 2 (two) times daily.  Vladimir Faster Glycol-Propyl Glycol (SYSTANE OP) Place 1 drop into both eyes daily as needed (dry eyes).  Marland Kitchen PRESCRIPTION MEDICATION Inhale into the lungs at bedtime. CPAP  . traMADol (ULTRAM) 50 MG tablet Take 50 mg by mouth every 8 (eight) hours as needed.  . triamcinolone cream (KENALOG) 0.1 % Apply 1 application topically 2 (two) times daily. To affected area on leg  . [DISCONTINUED] albuterol (PROVENTIL HFA;VENTOLIN  HFA) 108 (90 Base) MCG/ACT inhaler Inhale 2 puffs into the lungs every 6 (six) hours as needed for wheezing or shortness of breath.  . [DISCONTINUED] amiodarone (PACERONE) 200 MG tablet TAKE ONE (1) TABLET EACH DAY  . [DISCONTINUED] BREO ELLIPTA 100-25 MCG/INH AEPB Inhale 1 puff into the lungs daily.  . [DISCONTINUED] ELIQUIS 5 MG TABS tablet TAKE ONE TABLET TWICE DAILY  . [DISCONTINUED]  furosemide (LASIX) 40 MG tablet Take 1 tablet (40 mg total) by mouth 2 (two) times daily.  . [DISCONTINUED] hydrALAZINE (APRESOLINE) 10 MG tablet Take 1 tablet (10 mg total) by mouth 3 (three) times daily.  . [DISCONTINUED] losartan (COZAAR) 100 MG tablet Take 1 tablet (100 mg total) by mouth daily.  . [DISCONTINUED] nitroGLYCERIN (NITROSTAT) 0.4 MG SL tablet Place 1 tablet (0.4 mg total) under the tongue every 5 (five) minutes as needed.  . [DISCONTINUED] pantoprazole (PROTONIX) 40 MG tablet TAKE ONE (1) TABLET EACH DAY  . [DISCONTINUED] potassium chloride (K-DUR) 10 MEQ tablet Take 2 tablets (20 mEq total) by mouth daily.  . [DISCONTINUED] propranolol (INDERAL) 20 MG tablet Take 20 mg by mouth 3 (three) times daily.  . [DISCONTINUED] rosuvastatin (CRESTOR) 40 MG tablet TAKE ONE (1) TABLET EACH DAY   No facility-administered encounter medications on file as of 04/19/2018.     Review of Systems  Constitutional: Negative for appetite change and unexpected weight change.  HENT: Negative for congestion and sinus pressure.   Eyes: Negative for pain and visual disturbance.  Respiratory: Negative for cough and chest tightness.        Breathing stable.    Cardiovascular: Positive for leg swelling. Negative for chest pain and palpitations.  Gastrointestinal: Negative for abdominal pain, diarrhea, nausea and vomiting.  Genitourinary: Negative for difficulty urinating and dysuria.  Musculoskeletal: Negative for joint swelling and myalgias.  Skin: Negative for color change and rash.  Neurological: Negative for  dizziness, light-headedness and headaches.  Hematological: Negative for adenopathy. Does not bruise/bleed easily.  Psychiatric/Behavioral: Negative for agitation and dysphoric mood.       Objective:    Physical Exam Constitutional:      General: She is not in acute distress.    Appearance: Normal appearance. She is well-developed.  HENT:     Nose: Nose normal. No congestion.     Mouth/Throat:     Pharynx: No oropharyngeal exudate or posterior oropharyngeal erythema.  Eyes:     General: No scleral icterus.       Right eye: No discharge.        Left eye: No discharge.  Neck:     Musculoskeletal: Neck supple. No muscular tenderness.     Thyroid: No thyromegaly.  Cardiovascular:     Rate and Rhythm: Normal rate and regular rhythm.  Pulmonary:     Effort: No tachypnea, accessory muscle usage or respiratory distress.     Breath sounds: Normal breath sounds. No decreased breath sounds, wheezing or rhonchi.  Chest:     Breasts:        Right: No inverted nipple, mass, nipple discharge or tenderness (no axillary adenopathy).        Left: No inverted nipple, mass, nipple discharge or tenderness (no axilarry adenopathy).  Abdominal:     General: Bowel sounds are normal.     Palpations: Abdomen is soft.     Tenderness: There is no abdominal tenderness.  Musculoskeletal:        General: Swelling present.     Comments: Some tenderness to palpation - lower extremity.    Lymphadenopathy:     Cervical: No cervical adenopathy.  Skin:    General: Skin is warm.     Findings: No rash.  Neurological:     Mental Status: She is alert and oriented to person, place, and time.  Psychiatric:        Mood and Affect: Mood normal.        Behavior:  Behavior normal.     BP 140/78 (BP Location: Left Arm, Patient Position: Sitting, Cuff Size: Normal)   Pulse (!) 58   Temp 97.8 F (36.6 C) (Oral)   Resp 18   Ht 4' 11"  (1.499 m)   Wt 191 lb 3.2 oz (86.7 kg)   LMP 05/01/1972   SpO2 96%   BMI  38.62 kg/m  Wt Readings from Last 3 Encounters:  04/19/18 191 lb 3.2 oz (86.7 kg)  04/19/18 191 lb (86.6 kg)  03/20/18 193 lb 12 oz (87.9 kg)     Lab Results  Component Value Date   WBC 6.1 12/07/2017   HGB 12.8 12/07/2017   HCT 38.3 12/07/2017   PLT 219.0 12/07/2017   GLUCOSE 108 (H) 04/17/2018   CHOL 143 04/17/2018   TRIG 92.0 04/17/2018   HDL 71.80 04/17/2018   LDLCALC 53 04/17/2018   ALT 25 04/17/2018   AST 32 04/17/2018   NA 145 04/17/2018   K 4.5 04/17/2018   CL 106 04/17/2018   CREATININE 1.13 04/17/2018   BUN 14 04/17/2018   CO2 31 04/17/2018   TSH 3.30 08/07/2017   INR 0.99 03/25/2016   HGBA1C 6.1 04/17/2018   MICROALBUR 2.5 (H) 09/06/2017       Assessment & Plan:   Problem List Items Addressed This Visit    CAD S/P PCI pRCA Promus DES 3.5 x 16 (4.1 mm), ostRPDA Promus DES 2.5 x 12 (2.7 mm)    Followed by cardiology.  Stable.        Coronary artery disease involving native coronary artery of native heart with unstable angina pectoris (HCC)    No chest pain.  Followed by cardiology.  Stable.  Continue risk factor modification.        Diabetes mellitus ([place])    Low carb diet and exercise.  Follow met b and a1c.        Relevant Orders   Hemoglobin A1c   Essential hypertension, benign (Chronic)    Blood pressure as outlined. Have her spot check her pressures.  Lasix just increased to bid dosing today.  Follow pressures.  Follow metabolic panel.  Cardiology to check met b in one week.        Relevant Orders   TSH   Basic metabolic panel   GERD (gastroesophageal reflux disease)    Controlled on current regimen.  Follow.        Health care maintenance    Physical today 04/19/18.  Colonoscopy 06/13/11.  States overdue.  Refer to GI.  Mammogram 07/05/17 - birads I.        Hypercholesterolemia (Chronic)    On crestor.  Low cholesterol diet and exercise.  Follow lipid panel and liver function tests.        Relevant Orders   Lipid panel   Hepatic  function panel   Lower extremity edema    Increased lower extremity swelling as outlined.  Has tried compression hose.  Unable to get them on now. Off amlodipine.  Saw cardiology today.  Lasix increased to bid.  Will have vascular surgery evaluate.        Relevant Orders   Ambulatory referral to Vascular Surgery   Obstructive sleep apnea (Chronic)    Continue cpap.       Paroxysmal atrial fibrillation (HCC)    On eliquis.  Stable.  Followed by cardiology.        SOB (shortness of breath)    Breathing stable.  Followed by cardiology and  pulmonary.         Other Visit Diagnoses    Colon cancer screening    -  Primary   Relevant Orders   Ambulatory referral to Gastroenterology       Einar Pheasant, MD

## 2018-04-19 NOTE — Patient Instructions (Signed)
Medication Instructions:  Your physician has recommended you make the following change in your medication:  1- increase lasix 1 tablet (40 mg) twice daily  If you need a refill on your cardiac medications before your next appointment, please call your pharmacy.   Lab work: Your physician recommends that you return for lab work in: 1 week (BMET)  If you have labs (blood work) drawn today and your tests are completely normal, you will receive your results only by: Marland Kitchen MyChart Message (if you have MyChart) OR . A paper copy in the mail If you have any lab test that is abnormal or we need to change your treatment, we will call you to review the results.  Testing/Procedures: None ordered   Follow-Up: At [facility], you and your health needs are our priority.  As part of our continuing mission to provide you with exceptional heart care, we have created designated Provider Care Teams.  These Care Teams include your primary Cardiologist (physician) and Advanced Practice Providers (APPs -  Physician Assistants and Nurse Practitioners) who all work together to provide you with the care you need, when you need it. You will need a follow up appointment in 4-6  weeks.  Please call our office 2 months in advance to schedule this appointment.  You may see Ida Rogue, MD or one of the following Advanced Practice Providers on your designated Care Team:   Murray Hodgkins, NP Christell Faith, PA-C . Marrianne Mood, PA-C

## 2018-04-22 ENCOUNTER — Other Ambulatory Visit: Payer: Self-pay

## 2018-04-22 ENCOUNTER — Telehealth: Payer: Self-pay | Admitting: Cardiovascular Disease

## 2018-04-22 ENCOUNTER — Telehealth: Payer: Self-pay

## 2018-04-22 MED ORDER — FLUTICASONE FUROATE-VILANTEROL 100-25 MCG/INH IN AEPB: 1.0000 | INHALATION_SPRAY | Freq: Every day | RESPIRATORY_TRACT | 3 refills | Status: DC

## 2018-04-22 MED ORDER — LOSARTAN POTASSIUM 100 MG PO TABS: 100.0000 mg | ORAL_TABLET | Freq: Every day | ORAL | 0 refills | Status: DC

## 2018-04-22 MED ORDER — HYDRALAZINE HCL 10 MG PO TABS: 10.0000 mg | ORAL_TABLET | Freq: Three times a day (TID) | ORAL | 0 refills | Status: DC

## 2018-04-22 MED ORDER — ALBUTEROL SULFATE HFA 108 (90 BASE) MCG/ACT IN AERS: 2.0000 | INHALATION_SPRAY | Freq: Four times a day (QID) | RESPIRATORY_TRACT | 6 refills | Status: DC | PRN

## 2018-04-22 MED ORDER — PROPRANOLOL HCL 20 MG PO TABS: 20.0000 mg | ORAL_TABLET | Freq: Three times a day (TID) | ORAL | 0 refills | Status: DC

## 2018-04-22 MED ORDER — POTASSIUM CHLORIDE ER 10 MEQ PO TBCR: 20.0000 meq | EXTENDED_RELEASE_TABLET | Freq: Every day | ORAL | 0 refills | Status: DC

## 2018-04-22 MED ORDER — ROSUVASTATIN CALCIUM 40 MG PO TABS: ORAL_TABLET | ORAL | 0 refills | Status: DC

## 2018-04-22 MED ORDER — APIXABAN 5 MG PO TABS: 5.0000 mg | ORAL_TABLET | Freq: Two times a day (BID) | ORAL | 0 refills | Status: DC

## 2018-04-22 MED ORDER — FUROSEMIDE 40 MG PO TABS: 40.0000 mg | ORAL_TABLET | Freq: Two times a day (BID) | ORAL | 0 refills | Status: DC

## 2018-04-22 MED ORDER — NITROGLYCERIN 0.4 MG SL SUBL: 0.4000 mg | SUBLINGUAL_TABLET | SUBLINGUAL | 0 refills | Status: DC | PRN

## 2018-04-22 MED ORDER — AMIODARONE HCL 200 MG PO TABS: ORAL_TABLET | ORAL | 0 refills | Status: DC

## 2018-04-22 MED ORDER — PANTOPRAZOLE SODIUM 40 MG PO TBEC: DELAYED_RELEASE_TABLET | ORAL | 0 refills | Status: DC

## 2018-04-22 NOTE — Telephone Encounter (Signed)
[facility] [employer] [employer] that patient used to be with [facility] but it has since closed and they are unable to get in touch with [facility] at all  States that patient will need all active prescriptions sent to [facility] so patient can get her medication Will be sending over a fax request Unsure of which medications she will need Please advise

## 2018-04-22 NOTE — Telephone Encounter (Signed)
Called patient, let her know we have sent her prescriptions to [facility]. Patient aware with nothing further needed at this time.

## 2018-04-22 NOTE — Telephone Encounter (Signed)
Requested Prescriptions   Signed Prescriptions Disp Refills  . amiodarone (PACERONE) 200 MG tablet 90 tablet 0    Sig: TAKE ONE (1) TABLET EACH DAY    Authorizing Provider: Minna Merritts    Ordering User: Janan Ridge apixaban (ELIQUIS) 5 MG TABS tablet 180 tablet 0    Sig: Take 1 tablet (5 mg total) by mouth 2 (two) times daily.    Authorizing Provider: Minna Merritts    Ordering User: Janan Ridge furosemide (LASIX) 40 MG tablet 180 tablet 0    Sig: Take 1 tablet (40 mg total) by mouth 2 (two) times daily.    Authorizing Provider: Minna Merritts    Ordering User: Janan Ridge hydrALAZINE (APRESOLINE) 10 MG tablet 270 tablet 0    Sig: Take 1 tablet (10 mg total) by mouth 3 (three) times daily.    Authorizing Provider: Minna Merritts    Ordering User: Janan Ridge losartan (COZAAR) 100 MG tablet 90 tablet 0    Sig: Take 1 tablet (100 mg total) by mouth daily.    Authorizing Provider: Minna Merritts    Ordering User: Janan Ridge nitroGLYCERIN (NITROSTAT) 0.4 MG SL tablet 25 tablet 0    Sig: Place 1 tablet (0.4 mg total) under the tongue every 5 (five) minutes as needed.    Authorizing Provider: Minna Merritts    Ordering User: Janan Ridge pantoprazole (PROTONIX) 40 MG tablet 90 tablet 0    Sig: TAKE ONE (1) TABLET EACH DAY    Authorizing Provider: Minna Merritts    Ordering User: Janan Ridge  . potassium chloride (K-DUR) 10 MEQ tablet 180 tablet 0    Sig: Take 2 tablets (20 mEq total) by mouth daily.    Authorizing Provider: Minna Merritts    Ordering User: Janan Ridge propranolol (INDERAL) 20 MG tablet 270 tablet 0    Sig: Take 1 tablet (20 mg total) by mouth 3 (three) times daily.    Authorizing Provider: Minna Merritts    Ordering User: Janan Ridge rosuvastatin (CRESTOR) 40 MG tablet 90 tablet 0    Sig: TAKE ONE (1) TABLET EACH DAY    Authorizing Provider:  Minna Merritts    Ordering User: Janan Ridge

## 2018-04-22 NOTE — Telephone Encounter (Signed)
Spoke with patient.  Made her aware that I was going to contact [facility] to see what all information they needed.  Patient stated she will need refills on all Cardiac medications and would like a 90 day supply.   Spoke with [facility].  Let them know I could send in all cardiac medications Sent all cardiac medications to [facility] for a 90 day supply.    Requested Prescriptions   Signed Prescriptions Disp Refills  . amiodarone (PACERONE) 200 MG tablet 90 tablet 0    Sig: TAKE ONE (1) TABLET EACH DAY    Authorizing Provider: Minna Merritts    Ordering User: Janan Ridge apixaban (ELIQUIS) 5 MG TABS tablet 180 tablet 0    Sig: Take 1 tablet (5 mg total) by mouth 2 (two) times daily.    Authorizing Provider: Minna Merritts    Ordering User: Janan Ridge furosemide (LASIX) 40 MG tablet 180 tablet 0    Sig: Take 1 tablet (40 mg total) by mouth 2 (two) times daily.    Authorizing Provider: Minna Merritts    Ordering User: Janan Ridge hydrALAZINE (APRESOLINE) 10 MG tablet 270 tablet 0    Sig: Take 1 tablet (10 mg total) by mouth 3 (three) times daily.    Authorizing Provider: Minna Merritts    Ordering User: Janan Ridge losartan (COZAAR) 100 MG tablet 90 tablet 0    Sig: Take 1 tablet (100 mg total) by mouth daily.    Authorizing Provider: Minna Merritts    Ordering User: Janan Ridge nitroGLYCERIN (NITROSTAT) 0.4 MG SL tablet 25 tablet 0    Sig: Place 1 tablet (0.4 mg total) under the tongue every 5 (five) minutes as needed.    Authorizing Provider: Minna Merritts    Ordering User: Janan Ridge pantoprazole (PROTONIX) 40 MG tablet 90 tablet 0    Sig: TAKE ONE (1) TABLET EACH DAY    Authorizing Provider: Minna Merritts    Ordering User: Janan Ridge  . potassium chloride (K-DUR) 10 MEQ tablet 180 tablet 0    Sig: Take 2 tablets (20 mEq total)  by mouth daily.    Authorizing Provider: Minna Merritts    Ordering User: Janan Ridge propranolol (INDERAL) 20 MG tablet 270 tablet 0    Sig: Take 1 tablet (20 mg total) by mouth 3 (three) times daily.    Authorizing Provider: Minna Merritts    Ordering User: Janan Ridge rosuvastatin (CRESTOR) 40 MG tablet 90 tablet 0    Sig: TAKE ONE (1) TABLET EACH DAY    Authorizing Provider: Minna Merritts    Ordering User: Jan[facility] Ridge

## 2018-04-22 NOTE — Telephone Encounter (Signed)
°*  STAT* If patient is at the pharmacy, call can be transferred to refill team.   1. Which medications need to be refilled? (please list name of each medication and dose if known)  Propranolol 20 mg po TID  2. Which pharmacy/location (including street and city if local pharmacy) is medication to be sent to? [facility]   3. Do they need a 30 day or 90 day supply? [place]

## 2018-04-22 NOTE — Telephone Encounter (Signed)
Medication was already sent in today,  propranolol (INDERAL) 20 MG tablet 270 tablet 0 04/22/2018    Sig - Route: Take 1 tablet (20 mg total) by mouth 3 (three) times daily. - Oral   Sent to pharmacy as: propranolol (INDERAL) 20 MG tablet   E-Prescribing Status: Receipt confirmed by pharmacy (04/22/2018 9:51 AM EST)            [place], Marlin [place]

## 2018-04-27 ENCOUNTER — Encounter: Payer: Self-pay | Admitting: Internal Medicine

## 2018-04-27 NOTE — Assessment & Plan Note (Signed)
Breathing stable.  Followed by cardiology and pulmonary.

## 2018-04-27 NOTE — Assessment & Plan Note (Signed)
On eliquis.  Stable.  Followed by cardiology.

## 2018-04-27 NOTE — Assessment & Plan Note (Signed)
Followed by cardiology. Stable.   

## 2018-04-27 NOTE — Assessment & Plan Note (Signed)
Low carb diet and exercise.  Follow met b and a1c.   

## 2018-04-27 NOTE — Assessment & Plan Note (Signed)
On crestor.  Low cholesterol diet and exercise.  Follow lipid panel and liver function tests.   

## 2018-04-27 NOTE — Assessment & Plan Note (Signed)
Physical today 04/19/18.  Colonoscopy 06/13/11.  States overdue.  Refer to GI.  Mammogram 07/05/17 - birads I.

## 2018-04-27 NOTE — Assessment & Plan Note (Signed)
Increased lower extremity swelling as outlined.  Has tried compression hose.  Unable to get them on now. Off amlodipine.  Saw cardiology today.  Lasix increased to bid.  Will have vascular surgery evaluate.

## 2018-04-27 NOTE — Assessment & Plan Note (Signed)
Continue cpap.  

## 2018-04-27 NOTE — Assessment & Plan Note (Signed)
Blood pressure as outlined. Have her spot check her pressures.  Lasix just increased to bid dosing today.  Follow pressures.  Follow metabolic panel.  Cardiology to check met b in one week.

## 2018-04-27 NOTE — Assessment & Plan Note (Signed)
Controlled on current regimen.  Follow.  

## 2018-04-27 NOTE — Assessment & Plan Note (Signed)
No chest pain.  Followed by cardiology.  Stable.  Continue risk factor modification.

## 2018-04-29 ENCOUNTER — Other Ambulatory Visit (INDEPENDENT_AMBULATORY_CARE_PROVIDER_SITE_OTHER): Payer: Medicare Other

## 2018-04-29 DIAGNOSIS — I5033 Acute on chronic diastolic (congestive) heart failure: Secondary | ICD-10-CM | POA: Diagnosis not present

## 2018-04-29 DIAGNOSIS — I480 Paroxysmal atrial fibrillation: Secondary | ICD-10-CM

## 2018-04-29 DIAGNOSIS — I10 Essential (primary) hypertension: Secondary | ICD-10-CM | POA: Diagnosis not present

## 2018-04-30 ENCOUNTER — Encounter (INDEPENDENT_AMBULATORY_CARE_PROVIDER_SITE_OTHER): Payer: Self-pay | Admitting: Vascular Surgery

## 2018-04-30 ENCOUNTER — Ambulatory Visit (INDEPENDENT_AMBULATORY_CARE_PROVIDER_SITE_OTHER): Payer: Medicare Other | Admitting: Vascular Surgery

## 2018-04-30 VITALS — BP 136/65 | HR 61 | Resp 18 | Ht 59.0 in | Wt 191.0 lb

## 2018-04-30 DIAGNOSIS — E1159 Type 2 diabetes mellitus with other circulatory complications: Secondary | ICD-10-CM

## 2018-04-30 DIAGNOSIS — I480 Paroxysmal atrial fibrillation: Secondary | ICD-10-CM | POA: Diagnosis not present

## 2018-04-30 DIAGNOSIS — R600 Localized edema: Secondary | ICD-10-CM | POA: Diagnosis not present

## 2018-04-30 DIAGNOSIS — E7800 Pure hypercholesterolemia, unspecified: Secondary | ICD-10-CM

## 2018-04-30 DIAGNOSIS — I10 Essential (primary) hypertension: Secondary | ICD-10-CM

## 2018-04-30 DIAGNOSIS — I2510 Atherosclerotic heart disease of native coronary artery without angina pectoris: Secondary | ICD-10-CM | POA: Diagnosis not present

## 2018-04-30 DIAGNOSIS — Z9861 Coronary angioplasty status: Secondary | ICD-10-CM | POA: Diagnosis not present

## 2018-04-30 LAB — BASIC METABOLIC PANEL
BUN/Creatinine Ratio: 19 (ref 12–28)
BUN: 26 mg/dL (ref 8–27)
CO2: 26 mmol/L (ref 20–29)
Calcium: 9 mg/dL (ref 8.7–10.3)
Chloride: 106 mmol/L (ref 96–106)
Creatinine, Ser: 1.37 mg/dL — ABNORMAL HIGH (ref 0.57–1.00)
GFR calc Af Amer: 42 mL/min/{1.73_m2} — ABNORMAL LOW (ref >59)
GFR calc non Af Amer: 37 mL/min/{1.73_m2} — ABNORMAL LOW (ref >59)
GLUCOSE: 100 mg/dL — AB (ref 65–99)
Potassium: 4.9 mmol/L (ref 3.5–5.2)
Sodium: 147 mmol/L — ABNORMAL HIGH (ref 134–144)

## 2018-04-30 NOTE — Assessment & Plan Note (Signed)
blood glucose control important in reducing the progression of atherosclerotic disease. Also, involved in wound healing. On appropriate medications.  

## 2018-04-30 NOTE — Assessment & Plan Note (Addendum)
On anticoagulation and rate controlled. Cardiac issues can worsen lower extremity edema.

## 2018-04-30 NOTE — Assessment & Plan Note (Signed)
blood pressure control important in reducing the progression of atherosclerotic disease. On appropriate oral medications.  

## 2018-04-30 NOTE — Assessment & Plan Note (Signed)
lipid control important in reducing the progression of atherosclerotic disease. Continue statin therapy  

## 2018-04-30 NOTE — Patient Instructions (Signed)

## 2018-04-30 NOTE — Progress Notes (Signed)
Patient ID: April [name], female   DOB: 1941/04/07, 77 y.o.   MRN: [id number]  Chief Complaint  Patient presents with  . Advice Only    Bilateral lower extremity swelling    HPI April [name] is a 77 y.o. female.  I am asked to see the patient by Dr. Nicki Reaper for evaluation of leg swelling.  The patient reports about 8 months of progressive leg swelling.  There is no clear inciting event or causative factor that started the swelling.  Both legs are now affected.  Previously, the left leg was the only one with swelling.  She has no previous history of DVT or trauma to her knowledge.  She denies any fevers or chills.  No chest pain or shortness of breath.  She does have several medical issues which could create leg swelling including kidney and heart disease.  She has had one area of blistering with some ulceration on the lateral aspect of the left leg several weeks ago, but this did heal.  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  She does elevate her legs as much as possible.   Past Medical History:  Diagnosis Date  . CAD S/P percutaneous coronary angioplasty    a. 07/2015 MV: No ischemia, EF 66%;  b. 03/2016 Inflat STEMI/PCI: LM nl, LAD 25d, RI nl, LCX nl, OM1/2 nl, RCA 80p (3.5x16 Promus Premier DES), 50p/m, 30d, RPDA 90 (2.5x12 Promu Premier DES); c. 01/2018 Cath ([place], [place]): Patent RCA stents->Med Rx.  . CKD (chronic kidney disease), stage III ([place])   . Diastolic dysfunction    a. 10/2013 Echo: EF 55-65%, Gr1 DD; b. 01/2018 Echo ([place], [place]): EF 55-60%, Gr2 DD, RVSP 93mHg.  . Diverticulitis   . Endometriosis   . Hiatal hernia   . History of colon polyps 08/1994  . History of Migraines   . Hypercholesterolemia   . Hypertension   . LBBB (left bundle branch block)   . Osteoporosis   . PAF (paroxysmal atrial fibrillation) ([facility]    a. 03/2016 Dx @ time of MI; b. CHA2DS2VASc =  5-->Eliquis.  . Rheumatic fever   . Sleep apnea    CPAP  . ST elevation myocardial infarction (STEMI) of inferolateral wall, initial episode of care ([facility] 03/25/2016  . Urine incontinence     Past Surgical History:  Procedure Laterality Date  . ABDOMINAL HYSTERECTOMY     ovaries not removed  . APPENDECTOMY     was removed during hysterectomy  . Breast biopsies     x2  . BREAST BIOPSY Bilateral "years ago"  . BREAST SURGERY Bilateral   . CARDIAC CATHETERIZATION  2011   moderate 40% RCA disease  . CARDIAC CATHETERIZATION  01/2010   Dr. Gollan@[facility] : Only noted 40% RCA  . CARDIAC CATHETERIZATION N/A 03/25/2016   Procedure: Left Heart Cath and Coronary Angiography;  Surgeon: D12/11/2015 MD;  [facility];  Service: Cardiovascular;  Laterality: N/A;  . CARDIAC CATHETERIZATION N/A 03/25/2016   Procedure: Coronary Stent Intervention;  Surgeon: D12/11/2015 MD;  [facility];  Service: Cardiovascular;  Laterality: N/A;  . COLONOSCOPY  2013  . ESOPHAGOGASTRODUODENOSCOPY (EGD) WITH PROPOFOL N/A 03/17/2015   Procedure: ESOPHAGOGASTRODUODENOSCOPY (EGD) WITH PROPOFOL;  Surgeon: S11/29/2016 MD;  Location: [facility] ENDOSCOPY;  Service: Gastroenterology;  Laterality: N/A;  . NM MYOVIEW ([facility])  07/2015   No evidence ischemia or infarction. EF 66%. Low risk  . TONSILLECTOMY    . TRANSTHORACIC ECHOCARDIOGRAM  10/2013   Normal LV size and function. EF 55-65%. GR 1 DD. Otherwise normal.    Family History  Problem Relation Age of Onset  . Arthritis Mother   . Stroke Mother   . Hypertension Mother   . Osteoporosis Mother   . Heart disease Father        MI  . Heart attack Father   . Stroke Brother   . Heart attack Sister   . Breast cancer Other        first cousin x 2  . Stroke Daughter   . Heart attack Daughter   . Colon cancer Neg Hx     Social History Social History   Tobacco Use  . Smoking status: Never Smoker  . Smokeless  tobacco: Never Used  Substance Use Topics  . Alcohol use: No    Alcohol/week: 0.0 standard drinks  . Drug use: No    Allergies  Allergen Reactions  . Penicillins Other (See Comments)    Okay to take amoxicillin/(pt does not recall what the reaction to penicillin was (27-71 years old)   . Sulfa Antibiotics Rash    Current Outpatient Medications  Medication Sig Dispense Refill  . acetaminophen (TYLENOL) 500 MG tablet Take 500 mg by mouth daily as needed for headache (pain).    Marland Kitchen albuterol (PROVENTIL HFA;VENTOLIN HFA) 108 (90 Base) MCG/ACT inhaler Inhale 2 puffs into the lungs every 6 (six) hours as needed for wheezing or shortness of breath. 1 Inhaler 6  . amiodarone (PACERONE) 200 MG tablet TAKE ONE (1) TABLET EACH DAY 90 tablet 0  . apixaban (ELIQUIS) 5 MG TABS tablet Take 1 tablet (5 mg total) by mouth 2 (two) times daily. 180 tablet 0  . azelastine (ASTELIN) 0.1 % nasal spray Place 2 sprays into both nostrils daily as needed for rhinitis. Use in each nostril as directed    . cholecalciferol (VITAMIN D) 1000 units tablet Take 1,000 Units by mouth 2 (two) times daily.    . fish oil-omega-3 fatty acids 1000 MG capsule Take 1 g by mouth 2 (two) times daily.     . fluticasone furoate-vilanterol (BREO ELLIPTA) 100-25 MCG/INH AEPB Inhale 1 puff into the lungs daily. 60 each 3  . furosemide (LASIX) 40 MG tablet Take 1 tablet (40 mg total) by mouth 2 (two) times daily. 180 tablet 0  . hydrALAZINE (APRESOLINE) 10 MG tablet Take 1 tablet (10 mg total) by mouth 3 (three) times daily. 270 tablet 0  . losartan (COZAAR) 100 MG tablet Take 1 tablet (100 mg total) by mouth daily. 90 tablet 0  . Menthol, Topical Analgesic, (BIOFREEZE EX) Apply 1 application topically daily as needed (pain).    . Misc Natural Products (OSTEO BI-FLEX JOINT SHIELD PO) Take 1 tablet by mouth 2 (two) times daily.     . Multiple Vitamin (MULTIVITAMIN WITH MINERALS) TABS tablet Take 0.5 tablets by mouth 2 (two) times daily.     . nitroGLYCERIN (NITROSTAT) 0.4 MG SL tablet Place 1 tablet (0.4 mg total) under the tongue every 5 (five) minutes as needed. 25 tablet 0  . pantoprazole (PROTONIX) 40 MG tablet TAKE ONE (1) TABLET EACH DAY 90 tablet 0  . Polyethyl Glycol-Propyl Glycol (SYSTANE OP) Place 1 drop into both eyes daily as needed (dry eyes).    . potassium chloride (K-DUR) 10 MEQ tablet Take 2 tablets (20  mEq total) by mouth daily. 180 tablet 0  . PRESCRIPTION MEDICATION Inhale into the lungs at bedtime. CPAP    . propranolol (INDERAL) 20 MG tablet Take 1 tablet (20 mg total) by mouth 3 (three) times daily. 270 tablet 0  . rosuvastatin (CRESTOR) 40 MG tablet TAKE ONE (1) TABLET EACH DAY 90 tablet 0  . traMADol (ULTRAM) 50 MG tablet Take 50 mg by mouth every 8 (eight) hours as needed.    . triamcinolone cream (KENALOG) 0.1 % Apply 1 application topically 2 (two) times daily. To affected area on leg (Patient not taking: Reported on 04/30/2018) 30 g 0   No current facility-administered medications for this visit.       REVIEW OF SYSTEMS (Negative unless checked)  Constitutional: [] Weight loss  [] Fever  [] Chills Cardiac: [] Chest pain   [] Chest pressure   [x] Palpitations   [] Shortness of breath when laying flat   [] Shortness of breath at rest   [x] Shortness of breath with exertion. Vascular:  [x] Pain in legs with walking   [] Pain in legs at rest   [] Pain in legs when laying flat   [] Claudication   [] Pain in feet when walking  [] Pain in feet at rest  [] Pain in feet when laying flat   [] History of DVT   [] Phlebitis   [x] Swelling in legs   [x] Varicose veins   [] Non-healing ulcers Pulmonary:   [] Uses home oxygen   [] Productive cough   [] Hemoptysis   [] Wheeze  [] COPD   [] Asthma Neurologic:  [] Dizziness  [] Blackouts   [] Seizures   [] History of stroke   [] History of TIA  [] Aphasia   [] Temporary blindness   [] Dysphagia   [] Weakness or numbness in arms   [] Weakness or numbness in legs Musculoskeletal:  [x] Arthritis   [] Joint  swelling   [x] Joint pain   [] Low back pain Hematologic:  [] Easy bruising  [] Easy bleeding   [] Hypercoagulable state   [] Anemic  [] Hepatitis Gastrointestinal:  [] Blood in stool   [] Vomiting blood  [x] Gastroesophageal reflux/heartburn   [] Abdominal pain Genitourinary:  [] Chronic kidney disease   [] Difficult urination  [] Frequent urination  [] Burning with urination   [] Hematuria Skin:  [] Rashes   [] Ulcers   [] Wounds Psychological:  [] History of anxiety   []  History of major depression.    Physical Exam BP 136/65 (BP Location: Right Arm)   Pulse 61   Resp 18   Ht 4' 11"  (1.499 m)   Wt 191 lb (86.6 kg)   LMP 05/01/1972   BMI 38.58 kg/m  Gen:  WD/WN, NAD Head: [place]/AT, No temporalis wasting.  Ear/Nose/Throat: Hearing grossly intact, nares w/o erythema or drainage, oropharynx w/o Erythema/Exudate Eyes: Conjunctiva clear, sclera non-icteric  Neck: trachea midline.   Pulmonary:  Good air movement, respirations not labored, no use of accessory muscles Cardiac: RRR, no JVD Vascular:  Vessel Right Left  Radial Palpable Palpable                          PT 1+ Palpable Not Palpable  DP 1+ Palpable Trace Palpable    Musculoskeletal: M/S 5/5 throughout.  Extremities without ischemic changes.  No deformity or atrophy.  1+ right lower extremity edema, 2-3+ left lower extremity edema.  Dry scaling skin is present worse on the left than the right.  Stasis dermatitis changes are present worse on the left than the right. Neurologic: Sensation grossly intact in extremities.  Symmetrical.  Speech is fluent. Motor exam as listed above. Psychiatric: Judgment intact, Mood &  affect appropriate for pt's clinical situation. Dermatologic: No rashes or ulcers noted.  No cellulitis or open wounds.   Radiology No results found.  Labs Recent Results (from the past 2160 hour(s))  Basic metabolic panel     Status: Abnormal   Collection Time: 02/11/18  5:01 PM  Result Value Ref Range   Sodium 142 135 -  145 mmol/L   Potassium 4.4 3.5 - 5.1 mmol/L   Chloride 106 98 - 111 mmol/L   CO2 29 22 - 32 mmol/L   Glucose, Bld 116 (H) 70 - 99 mg/dL   BUN 18 8 - 23 mg/dL   Creatinine, Ser 1.15 (H) 0.44 - 1.00 mg/dL   Calcium 9.3 8.9 - 10.3 mg/dL   GFR calc non Af Amer 44 (L) >60 mL/min   GFR calc Af Amer 51 (L) >60 mL/min    Comment: (NOTE) The eGFR has been calculated using the CKD EPI equation. This calculation has not been validated in all clinical situations. eGFR's persistently <60 mL/min signify possible Chronic Kidney Disease.    Anion gap 7 5 - 15    Comment: Performed at [facility], [address] [place], [place] [address]  Basic metabolic panel     Status: Abnormal   Collection Time: 03/25/18  1:45 PM  Result Value Ref Range   Glucose 143 (H) 65 - 99 mg/dL   BUN 16 8 - 27 mg/dL   Creatinine, Ser 1.01 (H) 0.57 - 1.00 mg/dL   GFR calc non Af Amer 53 (L) >59 mL/min/1.73   GFR calc Af Amer 61 >59 mL/min/1.73   BUN/Creatinine Ratio 16 12 - 28   Sodium 144 134 - 144 mmol/L   Potassium 4.2 3.5 - 5.2 mmol/L   Chloride 104 96 - 106 mmol/L   CO2 27 20 - 29 mmol/L   Calcium 9.3 8.7 - 10.3 mg/dL  Basic metabolic panel     Status: Abnormal   Collection Time: 04/17/18  9:25 AM  Result Value Ref Range   Sodium 145 135 - 145 mEq/L   Potassium 4.5 3.5 - 5.1 mEq/L   Chloride 106 96 - 112 mEq/L   CO2 31 19 - 32 mEq/L   Glucose, Bld 108 (H) 70 - 99 mg/dL   BUN 14 6 - 23 mg/dL   Creatinine, Ser 1.13 0.40 - 1.20 mg/dL   Calcium 9.2 8.4 - 10.5 mg/dL   GFR 49.31 (L) >60.00 mL/min  Lipid panel     Status: None   Collection Time: 04/17/18  9:25 AM  Result Value Ref Range   Cholesterol 143 0 - 200 mg/dL    Comment: ATP III Classification       Desirable:  < 200 mg/dL               Borderline High:  200 - 239 mg/dL          High:  > = 240 mg/dL   Triglycerides 92.0 0.0 - 149.0 mg/dL    Comment: Normal:  <150 mg/dLBorderline High:  150 - 199 mg/dL   HDL 71.80 >39.00 mg/dL   VLDL  18.4 0.0 - 40.0 mg/dL   LDL Cholesterol 53 0 - 99 mg/dL   Total CHOL/HDL Ratio 2     Comment:                Men          Women1/2 Average Risk     3.4  3.3Average Risk          5.0          4.42X Average Risk          9.6          7.13X Average Risk          15.0          11.0                       NonHDL 71.32     Comment: NOTE:  Non-HDL goal should be 30 mg/dL higher than patient's LDL goal (i.e. LDL goal of < 70 mg/dL, would have non-HDL goal of < 100 mg/dL)  Hepatic function panel     Status: None   Collection Time: 04/17/18  9:25 AM  Result Value Ref Range   Total Bilirubin 0.5 0.2 - 1.2 mg/dL   Bilirubin, Direct 0.1 0.0 - 0.3 mg/dL   Alkaline Phosphatase 57 39 - 117 U/L   AST 32 0 - 37 U/L   ALT 25 0 - 35 U/L   Total Protein 6.5 6.0 - 8.3 g/dL   Albumin 4.0 3.5 - 5.2 g/dL  Hemoglobin A1c     Status: None   Collection Time: 04/17/18  9:25 AM  Result Value Ref Range   Hgb A1c MFr Bld 6.1 4.6 - 6.5 %    Comment: Glycemic Control Guidelines for People with Diabetes:Non Diabetic:  <6%Goal of Therapy: <7%Additional Action Suggested:  >2%   Basic Metabolic Panel (BMET)     Status: Abnormal   Collection Time: 04/29/18 10:58 AM  Result Value Ref Range   Glucose 100 (H) 65 - 99 mg/dL   BUN 26 8 - 27 mg/dL   Creatinine, Ser 1.37 (H) 0.57 - 1.00 mg/dL   GFR calc non Af Amer 37 (L) >59 mL/min/1.73   GFR calc Af Amer 42 (L) >59 mL/min/1.73   BUN/Creatinine Ratio 19 12 - 28   Sodium 147 (H) 134 - 144 mmol/L   Potassium 4.9 3.5 - 5.2 mmol/L   Chloride 106 96 - 106 mmol/L   CO2 26 20 - 29 mmol/L   Calcium 9.0 8.7 - 10.3 mg/dL    Assessment/Plan:  Paroxysmal atrial fibrillation (HCC) On anticoagulation and rate controlled. Cardiac issues can worsen lower extremity edema.  Essential hypertension, benign blood pressure control important in reducing the progression of atherosclerotic disease. On appropriate oral medications.   Diabetes mellitus ([place]) blood glucose control  important in reducing the progression of atherosclerotic disease. Also, involved in wound healing. On appropriate medications.   Hypercholesterolemia lipid control important in reducing the progression of atherosclerotic disease. Continue statin therapy   Lower extremity edema I have had a long discussion with the patient regarding swelling and why it  causes symptoms.  Patient would greatly benefit from compression stockings, but at current she is not really able to get these on and off.  We discussed using zippered compression stockings or some other form of compression, and this would be helpful.  She may benefit from a Velcro system or some other device that would allow her to get compression on and off reasonably well.  In addition, behavioral modification will be initiated.  This will include frequent elevation, use of over the counter pain medications and exercise such as walking.  I have reviewed systemic causes for chronic edema such as liver, kidney and cardiac etiologies.  The patient denies problems with these organ systems.  Consideration for a lymph pump will also be made based upon the effectiveness of conservative therapy.  This would help to improve the edema control and prevent sequela such as ulcers and infections   Patient should undergo duplex ultrasound of the venous system to ensure that DVT or reflux is not present.  The patient will follow-up with me after the ultrasound.        Leotis Pain 04/30/2018, 4:16 PM   This note was created with Dragon medical transcription system.  Any errors from dictation are unintentional.

## 2018-04-30 NOTE — Assessment & Plan Note (Signed)
I have had a long discussion with the patient regarding swelling and why it  causes symptoms.  Patient would greatly benefit from compression stockings, but at current she is not really able to get these on and off.  We discussed using zippered compression stockings or some other form of compression, and this would be helpful.  She may benefit from a Velcro system or some other device that would allow her to get compression on and off reasonably well.  In addition, behavioral modification will be initiated.  This will include frequent elevation, use of over the counter pain medications and exercise such as walking.  I have reviewed systemic causes for chronic edema such as liver, kidney and cardiac etiologies.  The patient denies problems with these organ systems.    Consideration for a lymph pump will also be made based upon the effectiveness of conservative therapy.  This would help to improve the edema control and prevent sequela such as ulcers and infections   Patient should undergo duplex ultrasound of the venous system to ensure that DVT or reflux is not present.  The patient will follow-up with me after the ultrasound.

## 2018-05-02 ENCOUNTER — Encounter (INDEPENDENT_AMBULATORY_CARE_PROVIDER_SITE_OTHER): Payer: Self-pay | Admitting: Nurse Practitioner

## 2018-05-02 ENCOUNTER — Ambulatory Visit (INDEPENDENT_AMBULATORY_CARE_PROVIDER_SITE_OTHER): Payer: Medicare Other

## 2018-05-02 ENCOUNTER — Ambulatory Visit (INDEPENDENT_AMBULATORY_CARE_PROVIDER_SITE_OTHER): Payer: Medicare Other | Admitting: Nurse Practitioner

## 2018-05-02 VITALS — BP 152/69 | HR 51 | Resp 14 | Ht 59.0 in | Wt 192.8 lb

## 2018-05-02 DIAGNOSIS — K219 Gastro-esophageal reflux disease without esophagitis: Secondary | ICD-10-CM

## 2018-05-02 DIAGNOSIS — I890 Lymphedema, not elsewhere classified: Secondary | ICD-10-CM

## 2018-05-02 DIAGNOSIS — R600 Localized edema: Secondary | ICD-10-CM

## 2018-05-02 DIAGNOSIS — I10 Essential (primary) hypertension: Secondary | ICD-10-CM

## 2018-05-07 ENCOUNTER — Encounter (INDEPENDENT_AMBULATORY_CARE_PROVIDER_SITE_OTHER): Payer: Self-pay | Admitting: Nurse Practitioner

## 2018-05-07 ENCOUNTER — Telehealth: Payer: Self-pay | Admitting: *Deleted

## 2018-05-07 NOTE — Telephone Encounter (Signed)
Theora Gianotti, NP  Minna Merritts, MD Cc: [facility] Triage        She's had a lot of trouble getting compression stockings on and off in the past. She sits much of the day unfortunately. In addition to the options offered, cutting her back to lasix 40 in the AM and 20 in the afternoon might also be reasonable.   Previous Messages    ----- Message -----  From: Minna Merritts, MD  Sent: 05/05/2018 10:50 AM EST  To: Theora Gianotti, NP, *   Looking a little dehydrated on the lasix 40 BID  Suspect leg swelling is not all cardiac, as Ignacia Bayley suggested on last office visit  Would usecompressions if swelling bothers her, leg elevation,  Cut back on lasix, skip weekends or  Take lasix 40 BID three days a week on mon/wed/Fri, lasix 40 daily on other days

## 2018-05-07 NOTE — Progress Notes (Signed)
Subjective:    Patient ID: April [name], female    DOB: Feb 08, 1941, 78 y.o.   MRN: [id number] Chief Complaint  Patient presents with  . Follow-up    HPI  April [name] is a 78 y.o. female that presents today with concerns for bilateral lower extremity swelling.  This is been ongoing for approximately 8 months or so.  The patient has no prior history of DVT or trauma to her bilateral lower extremities.  There is no clear cause.  She denies any chest pain or shortness of breath.  Patient does have a history of chronic kidney disease as well as diastolic cardiac dysfunction she denies any fever, chills, nausea, vomiting or diarrhea.  She did have a previous area of ulceration however this is healed at this time.  The patient does utilize medical grade 1 compression stockings, however it is difficult for her to put them on and off due to her arthritis.  The patient underwent bilateral lower extremity venous reflux studies which revealed no evidence of DVT bilaterally.  No evidence of superficial venous thrombosis bilaterally.  Right lower extremity has no evidence of chronic venous insufficiency.  The left lower extremity had reflux in the great saphenous vein at the saphenofemoral junction. Past Medical History:  Diagnosis Date  . CAD S/P percutaneous coronary angioplasty    a. 07/2015 MV: No ischemia, EF 66%;  b. 03/2016 [place] STEMI/PCI: LM nl, LAD 25d, RI nl, LCX nl, OM1/2 nl, RCA 80p (3.5x16 Promus Premier DES), 50p/m, 30d, RPDA 90 (2.5x12 Promu Premier DES); c. 01/2018 Cath ([place], [place]): Patent RCA stents->Med Rx.  . CKD (chronic kidney disease), stage III ([place])   . Diastolic dysfunction    a. 10/2013 Echo: EF 55-65%, Gr1 DD; b. 01/2018 Echo ([place], [place]): EF 55-60%, Gr2 DD, RVSP 46mmHg.  . Diverticulitis   . Endometriosis   . Hiatal hernia   . History of colon polyps 08/1994  . History of Migraines   . Hypercholesterolemia   . Hypertension   .  LBBB (left bundle branch block)   . Osteoporosis   . PAF (paroxysmal atrial fibrillation) ([place])    a. 03/2016 Dx @ time of MI; b. CHA2DS2VASc = 5-->Eliquis.  . Rheumatic fever   . Sleep apnea    CPAP  . ST elevation myocardial infarction (STEMI) of inferolateral wall, initial episode of care ([place]) 03/25/2016  . Urine incontinence     Past Surgical History:  Procedure Laterality Date  . ABDOMINAL HYSTERECTOMY     ovaries not removed  . APPENDECTOMY     was removed during hysterectomy  . Breast biopsies     x2  . BREAST BIOPSY Bilateral "years ago"  . BREAST SURGERY Bilateral   . CARDIAC CATHETERIZATION  2011   moderate 40% RCA disease  . CARDIAC CATHETERIZATION  01/2010   Dr. [email or web address] : Only noted 40% RCA  . CARDIAC CATHETERIZATION N/A 03/25/2016   Procedure: Left Heart Cath and Coronary Angiography;  Surgeon: 04/07/2016, MD;  Location: [facility];  Service: Cardiovascular;  Laterality: N/A;  . CARDIAC CATHETERIZATION N/A 03/25/2016   Procedure: Coronary Stent Intervention;  Surgeon: 04/07/2016, MD;  Location: [facility];  Service: Cardiovascular;  Laterality: N/A;  . COLONOSCOPY  2013  . ESOPHAGOGASTRODUODENOSCOPY (EGD) WITH PROPOFOL N/A 03/17/2015   Procedure: ESOPHAGOGASTRODUODENOSCOPY (EGD) WITH PROPOFOL;  Surgeon: 03/30/2015, MD;  Location: [facility];  Service: Gastroenterology;  Laterality: N/A;  .  NM MYOVIEW ([place] [facility])  07/2015   No evidence ischemia or infarction. EF 66%. Low risk  . TONSILLECTOMY    . TRANSTHORACIC ECHOCARDIOGRAM  10/2013   Normal LV size and function. EF 55-65%. GR 1 DD. Otherwise normal.    Social History   Socioeconomic History  . Marital status: Widowed    Spouse name: Not on file  . Number of children: 2  . Years of education: Not on file  . Highest education level: Not on file  Occupational History  . Not on file  Social Needs  . Financial resource strain: Not on file  . Food insecurity:     Worry: Not on file    Inability: Not on file  . Transportation needs:    Medical: Not on file    Non-medical: Not on file  Tobacco Use  . Smoking status: Never Smoker  . Smokeless tobacco: Never Used  Substance and Sexual Activity  . Alcohol use: No    Alcohol/week: 0.0 standard drinks  . Drug use: No  . Sexual activity: Never  Lifestyle  . Physical activity:    Days per week: Not on file    Minutes per session: Not on file  . Stress: Not on file  Relationships  . Social connections:    Talks on phone: Not on file    Gets together: Not on file    Attends religious service: Not on file    Active member of club or organization: Not on file    Attends meetings of clubs or organizations: Not on file    Relationship status: Not on file  . Intimate partner violence:    Fear of current or ex partner: Not on file    Emotionally abused: Not on file    Physically abused: Not on file    Forced sexual activity: Not on file  Other Topics Concern  . Not on file  Social History Narrative  . Not on file    Family History  Problem Relation Age of Onset  . Arthritis Mother   . Stroke Mother   . Hypertension Mother   . Osteoporosis Mother   . Heart disease Father        MI  . Heart attack Father   . Stroke Brother   . Heart attack Sister   . Breast cancer Other        first cousin x 2  . Stroke Daughter   . Heart attack Daughter   . Colon cancer Neg Hx     Allergies  Allergen Reactions  . Penicillins Other (See Comments)    Okay to take amoxicillin/(pt does not recall what the reaction to penicillin was (19-58 years old)   . Sulfa Antibiotics Rash     Review of Systems   Review of Systems: Negative Unless Checked Constitutional: [] Weight loss  [] Fever  [] Chills Cardiac: [] Chest pain   []  Atrial Fibrillation  [] Palpitations   [] Shortness of breath when laying flat   [x] Shortness of breath with exertion. [] Shortness of breath at rest Vascular:  [] Pain in legs with  walking   [] Pain in legs with standing [] Pain in legs when laying flat   [] Claudication    [] Pain in feet when laying flat    [] History of DVT   [] Phlebitis   [x] Swelling in legs   [] Varicose veins   [] Non-healing ulcers Pulmonary:   [] Uses home oxygen   [] Productive cough   [] Hemoptysis   [] Wheeze  [] COPD   [] Asthma Neurologic:  []   Dizziness   [] Seizures  [] Blackouts [] History of stroke   [] History of TIA  [] Aphasia   [] Temporary Blindness   [] Weakness or numbness in arm   [] Weakness or numbness in leg Musculoskeletal:   [] Joint swelling   [] Joint pain   [] Low back pain  []  History of Knee Replacement [] Arthritis [] back Surgeries  []  Spinal Stenosis    Hematologic:  [x] Easy bruising  [] Easy bleeding   [] Hypercoagulable state   [] Anemic Gastrointestinal:  [] Diarrhea   [] Vomiting  [] Gastroesophageal reflux/heartburn   [] Difficulty swallowing. [] Abdominal pain Genitourinary:  [x] Chronic kidney disease   [] Difficult urination  [] Anuric   [] Blood in urine [] Frequent urination  [] Burning with urination   [] Hematuria Skin:  [x] Rashes   [] Ulcers [] Wounds Psychological:  [] History of anxiety   []  History of major depression  []  Memory Difficulties     Objective:   Physical Exam  BP (!) 152/69 (BP Location: Right Arm, Patient Position: Sitting)   Pulse (!) 51   Resp 14   Ht 4\' 11"  (1.499 m)   Wt 192 lb 12.8 oz (87.5 kg)   LMP 05/01/1972   BMI 38.94 kg/m   Gen: WD/WN, NAD Head: [place]/AT, No temporalis wasting.  Ear/Nose/Throat: Hearing grossly intact, nares w/o erythema or drainage Eyes: PER, EOMI, sclera nonicteric.  Neck: Supple, no masses.  No JVD.  Pulmonary:  Good air movement, no use of accessory muscles.  Cardiac: RRR Vascular:  3+ pitting edema bilaterally with stasis dermatitis changes bilaterally.  Dermal thickening present bilaterally Vessel Right Left  Radial Palpable Palpable  Dorsalis Pedis Palpable Not Palpable  Posterior Tibial Palpable Trace Palpable   Gastrointestinal: soft,  non-distended. No guarding/no peritoneal signs.  Musculoskeletal: M/S 5/5 throughout.  No deformity or atrophy.  Neurologic: Pain and light touch intact in extremities.  Symmetrical.  Speech is fluent. Motor exam as listed above. Psychiatric: Judgment intact, Mood & affect appropriate for pt's clinical situation. Dermatologic:  No Ulcers Noted.  No changes consistent with cellulitis. Lymph : No Cervical lymphadenopathy     Assessment & Plan:   1. Gastroesophageal reflux disease without esophagitis Continue PPI as already ordered, this medication has been reviewed and there are no changes at this time.  Avoidence of caffeine and alcohol  Moderate elevation of the head of the bed   2. Essential hypertension, benign Continue antihypertensive medications as already ordered, these medications have been reviewed and there are no changes at this time.   3. Lymphedema Recommend:  Currently the patient has excessive edema that has been hard to control due to difficulty with compression stockings.  Today we will place her in bilateral wraps in order to gain control of the swelling so that she is also able to wear her compression stockings more effectively.  I have reviewed my previous discussion with the patient regarding swelling and why it causes symptoms.  Patient will continue wearing graduated compression stockings class 1 (20-30 mmHg) on a daily basis She has completed unnaa wrap therapy the patient will  beginning wearing the stockings first thing in the morning and removing them in the evening. The patient is instructed specifically not to sleep in the stockings.    In addition, behavioral modification including several periods of elevation of the lower extremities during the day will be continued.  This was reviewed with the patient during the initial visit.  The patient will also continue routine exercise, especially walking on a daily basis as was discussed during the initial visit.      Despite  conservative treatments including graduated compression therapy class 1 and behavioral modification including exercise and elevation the patient  has not obtained adequate control of the lymphedema.  The patient still has stage 3 lymphedema and therefore, I believe that a lymph pump should be added to improve the control of the patient's lymphedema.  Additionally, a lymph pump is warranted because it will reduce the risk of cellulitis and ulceration in the future.   The patient will follow-up and have a wrap changes done weekly for the next 3 weeks.  We will reassess the status of her swelling at that time.  Current Outpatient Medications on File Prior to Visit  Medication Sig Dispense Refill  . acetaminophen (TYLENOL) 500 MG tablet Take 500 mg by mouth daily as needed for headache (pain).    Marland Kitchen albuterol (PROVENTIL HFA;VENTOLIN HFA) 108 (90 Base) MCG/ACT inhaler Inhale 2 puffs into the lungs every 6 (six) hours as needed for wheezing or shortness of breath. 1 Inhaler 6  . amiodarone (PACERONE) 200 MG tablet TAKE ONE (1) TABLET EACH DAY 90 tablet 0  . apixaban (ELIQUIS) 5 MG TABS tablet Take 1 tablet (5 mg total) by mouth 2 (two) times daily. 180 tablet 0  . azelastine (ASTELIN) 0.1 % nasal spray Place 2 sprays into both nostrils daily as needed for rhinitis. Use in each nostril as directed    . cholecalciferol (VITAMIN D) 1000 units tablet Take 1,000 Units by mouth 2 (two) times daily.    . fish oil-omega-3 fatty acids 1000 MG capsule Take 1 g by mouth 2 (two) times daily.     . fluticasone furoate-vilanterol (BREO ELLIPTA) 100-25 MCG/INH AEPB Inhale 1 puff into the lungs daily. 60 each 3  . furosemide (LASIX) 40 MG tablet Take 1 tablet (40 mg total) by mouth 2 (two) times daily. 180 tablet 0  . hydrALAZINE (APRESOLINE) 10 MG tablet Take 1 tablet (10 mg total) by mouth 3 (three) times daily. 270 tablet 0  . losartan (COZAAR) 100 MG tablet Take 1 tablet (100 mg total) by mouth daily. 90  tablet 0  . Menthol, Topical Analgesic, (BIOFREEZE EX) Apply 1 application topically daily as needed (pain).    . Misc Natural Products (OSTEO BI-FLEX JOINT SHIELD PO) Take 1 tablet by mouth 2 (two) times daily.     . Multiple Vitamin (MULTIVITAMIN WITH MINERALS) TABS tablet Take 0.5 tablets by mouth 2 (two) times daily.    . nitroGLYCERIN (NITROSTAT) 0.4 MG SL tablet Place 1 tablet (0.4 mg total) under the tongue every 5 (five) minutes as needed. 25 tablet 0  . pantoprazole (PROTONIX) 40 MG tablet TAKE ONE (1) TABLET EACH DAY 90 tablet 0  . Polyethyl Glycol-Propyl Glycol (SYSTANE OP) Place 1 drop into both eyes daily as needed (dry eyes).    . potassium chloride (K-DUR) 10 MEQ tablet Take 2 tablets (20 mEq total) by mouth daily. 180 tablet 0  . PRESCRIPTION MEDICATION Inhale into the lungs at bedtime. CPAP    . propranolol (INDERAL) 20 MG tablet Take 1 tablet (20 mg total) by mouth 3 (three) times daily. 270 tablet 0  . rosuvastatin (CRESTOR) 40 MG tablet TAKE ONE (1) TABLET EACH DAY 90 tablet 0  . traMADol (ULTRAM) 50 MG tablet Take 50 mg by mouth every 8 (eight) hours as needed.    . triamcinolone cream (KENALOG) 0.1 % Apply 1 application topically 2 (two) times daily. To affected area on leg 30 g 0   No current facility-administered medications on  file prior to visit.     There are no Patient Instructions on file for this visit. No follow-ups on file.   Kris Hartmann, NP  This note was completed with Sales executive.  Any errors are purely unintentional.

## 2018-05-07 NOTE — Telephone Encounter (Signed)
Results called to pt. Pt verbalized understanding. She will opt to to the lasix 40 mg in the am and 20 mg in the afternoon.  She was appreciative.

## 2018-05-08 ENCOUNTER — Telehealth: Payer: Self-pay

## 2018-05-08 MED ORDER — FUROSEMIDE 40 MG PO TABS: ORAL_TABLET | ORAL | 3 refills | Status: DC

## 2018-05-08 NOTE — Telephone Encounter (Signed)
-----   Message from Theora Gianotti, NP sent at 05/06/2018 10:41 AM EST ----- She's had a lot of trouble getting compression stockings on and off in the past.  She sits much of the day unfortunately.  In addition to the options offered, cutting her back to lasix 40 in the AM and 20 in the afternoon might also be reasonable. ----- Message ----- From: Minna Merritts, MD Sent: 05/05/2018  10:50 AM EST To: Theora Gianotti, NP, #  Looking a little dehydrated on the lasix 40 BID Suspect leg swelling is not all cardiac, as Ignacia Bayley suggested on last office visit Would usecompressions if swelling bothers her, leg elevation, Cut back on lasix, skip weekends or  Take lasix 40 BID three days a week on mon/wed/Fri, lasix 40 daily on other days

## 2018-05-08 NOTE — Telephone Encounter (Signed)
Incoming return call from patient. I gave her results from Dr. Rockey Situ and recommendations from Ignacia Bayley, NP. She verbalized understanding and noted that she recently had legs wrapped by Dr. Lucky Cowboy for a period of 4 days. Advised pt to call for any further questions or concerns

## 2018-05-08 NOTE — Telephone Encounter (Signed)
Med list updated. Sending FYI to Ignacia Bayley, NP.

## 2018-05-08 NOTE — Telephone Encounter (Signed)
LMTCB

## 2018-05-09 ENCOUNTER — Encounter (INDEPENDENT_AMBULATORY_CARE_PROVIDER_SITE_OTHER): Payer: Self-pay

## 2018-05-09 ENCOUNTER — Ambulatory Visit (INDEPENDENT_AMBULATORY_CARE_PROVIDER_SITE_OTHER): Payer: Medicare Other | Admitting: Nurse Practitioner

## 2018-05-09 VITALS — BP 131/68 | HR 61 | Resp 16 | Ht 59.0 in | Wt 190.6 lb

## 2018-05-09 DIAGNOSIS — I890 Lymphedema, not elsewhere classified: Secondary | ICD-10-CM | POA: Diagnosis not present

## 2018-05-09 NOTE — Progress Notes (Signed)
History of Present Illness  There is no documented history at this time  Assessments & Plan   There are no diagnoses linked to this encounter.    Additional instructions  Subjective:  Patient presents with venous ulcer of the Bilateral lower extremity.    Procedure:  3 layer unna wrap was placed Bilateral lower extremity.   Plan:   Follow up in one week.  

## 2018-05-16 ENCOUNTER — Ambulatory Visit (INDEPENDENT_AMBULATORY_CARE_PROVIDER_SITE_OTHER): Payer: Medicare Other | Admitting: Nurse Practitioner

## 2018-05-16 ENCOUNTER — Encounter (INDEPENDENT_AMBULATORY_CARE_PROVIDER_SITE_OTHER): Payer: Self-pay

## 2018-05-16 VITALS — BP 162/70 | HR 56

## 2018-05-16 DIAGNOSIS — I890 Lymphedema, not elsewhere classified: Secondary | ICD-10-CM

## 2018-05-16 NOTE — Progress Notes (Signed)
History of Present Illness  There is no documented history at this time  Assessments & Plan   There are no diagnoses linked to this encounter.    Additional instructions  Subjective:  Patient presents with venous ulcer of the Bilateral lower extremity.    Procedure:  3 layer unna wrap was placed Bilateral lower extremity.   Plan:   Follow up in one week.  

## 2018-05-23 ENCOUNTER — Ambulatory Visit (INDEPENDENT_AMBULATORY_CARE_PROVIDER_SITE_OTHER): Payer: Medicare Other | Admitting: Nurse Practitioner

## 2018-05-23 ENCOUNTER — Encounter (INDEPENDENT_AMBULATORY_CARE_PROVIDER_SITE_OTHER): Payer: Self-pay | Admitting: Nurse Practitioner

## 2018-05-23 VITALS — BP 130/70 | HR 56 | Resp 16 | Ht 59.0 in | Wt 187.8 lb

## 2018-05-23 DIAGNOSIS — I890 Lymphedema, not elsewhere classified: Secondary | ICD-10-CM | POA: Diagnosis not present

## 2018-05-23 DIAGNOSIS — I10 Essential (primary) hypertension: Secondary | ICD-10-CM

## 2018-05-23 DIAGNOSIS — K219 Gastro-esophageal reflux disease without esophagitis: Secondary | ICD-10-CM

## 2018-05-23 NOTE — Progress Notes (Signed)
Subjective:    Patient ID: April [name], female    DOB: [birth year], 78 y.o.   MRN: [id number] Chief Complaint  Patient presents with  . Follow-up    HPI  April [name] is a 78 y.o. female that presents today for evaluation of leg swelling following Unna wrap therapy.  Today her legs appear much better.  No ulcerations present.  Patient denies any fever, chills, nausea, vomiting or diarrhea.  The patient has recently received her lymphedema pump and has been using it without issue.  She also has medical grade 1 compression stockings to help transition into.  She denies any chest pain or shortness of breath  Past Medical History:  Diagnosis Date  . CAD S/P percutaneous coronary angioplasty    a. 07/2015 MV: No ischemia, EF 66%;  b. 03/2016 Inflat STEMI/PCI: LM nl, LAD 25d, RI nl, LCX nl, OM1/2 nl, RCA 80p (3.5x16 Promus Premier DES), 50p/m, 30d, RPDA 90 (2.5x12 Promu Premier DES); c. 01/2018 Cath ([place], [place]): Patent RCA stents->Med Rx.  . CKD (chronic kidney disease), stage III ([place])   . Diastolic dysfunction    a. 10/2013 Echo: EF 55-65%, Gr1 DD; b. 01/2018 Echo ([place], [place]): EF 55-60%, Gr2 DD, RVSP 6mmHg.  . Diverticulitis   . Endometriosis   . Hiatal hernia   . History of colon polyps 08/1994  . History of Migraines   . Hypercholesterolemia   . Hypertension   . LBBB (left bundle branch block)   . Osteoporosis   . PAF (paroxysmal atrial fibrillation) ([place])    a. 03/2016 Dx @ time of MI; b. CHA2DS2VASc = 5-->Eliquis.  . Rheumatic fever   . Sleep apnea    CPAP  . ST elevation myocardial infarction (STEMI) of inferolateral wall, initial episode of care ([place]) 03/25/2016  . Urine incontinence     Past Surgical History:  Procedure Laterality Date  . ABDOMINAL HYSTERECTOMY     ovaries not removed  . APPENDECTOMY     was removed during hysterectomy  . Breast biopsies     x2  . BREAST BIOPSY Bilateral "years ago"  . BREAST SURGERY  Bilateral   . CARDIAC CATHETERIZATION  2011   moderate 40% RCA disease  . CARDIAC CATHETERIZATION  01/2010   Dr. Gollan@[facility] : Only noted 40% RCA  . CARDIAC CATHETERIZATION N/A 03/25/2016   Procedure: Left Heart Cath and Coronary Angiography;  Surgeon: 04/07/2016, MD;  Location: [facility];  Service: Cardiovascular;  Laterality: N/A;  . CARDIAC CATHETERIZATION N/A 03/25/2016   Procedure: Coronary Stent Intervention;  Surgeon: 04/07/2016, MD;  Location: [facility];  Service: Cardiovascular;  Laterality: N/A;  . COLONOSCOPY  2013  . ESOPHAGOGASTRODUODENOSCOPY (EGD) WITH PROPOFOL N/A 03/17/2015   Procedure: ESOPHAGOGASTRODUODENOSCOPY (EGD) WITH PROPOFOL;  Surgeon: 03/30/2015, MD;  Location: [facility]OSCOPY;  Service: Gastroenterology;  Laterality: N/A;  . NM MYOVIEW ([place] HX)  07/2015   No evidence ischemia or infarction. EF 66%. Low risk  . TONSILLECTOMY    . TRANSTHORACIC ECHOCARDIOGRAM  10/2013   Normal LV size and function. EF 55-65%. GR 1 DD. Otherwise normal.    Social History   Socioeconomic History  . Marital status: Widowed    Spouse name: Not on file  . Number of children: 2  . Years of education: Not on file  . Highest education level: Not on file  Occupational History  . Not on file  Social Needs  .  Financial resource strain: Not on file  . Food insecurity:    Worry: Not on file    Inability: Not on file  . Transportation needs:    Medical: Not on file    Non-medical: Not on file  Tobacco Use  . Smoking status: Never Smoker  . Smokeless tobacco: Never Used  Substance and Sexual Activity  . Alcohol use: No    Alcohol/week: 0.0 standard drinks  . Drug use: No  . Sexual activity: Never  Lifestyle  . Physical activity:    Days per week: Not on file    Minutes per session: Not on file  . Stress: Not on file  Relationships  . Social connections:    Talks on phone: Not on file    Gets together: Not on file    Attends religious  service: Not on file    Active member of club or organization: Not on file    Attends meetings of clubs or organizations: Not on file    Relationship status: Not on file  . Intimate partner violence:    Fear of current or ex partner: Not on file    Emotionally abused: Not on file    Physically abused: Not on file    Forced sexual activity: Not on file  Other Topics Concern  . Not on file  Social History Narrative  . Not on file    Family History  Problem Relation Age of Onset  . Arthritis Mother   . Stroke Mother   . Hypertension Mother   . Osteoporosis Mother   . Heart disease Father        MI  . Heart attack Father   . Stroke Brother   . Heart attack Sister   . Breast cancer Other        first cousin x 2  . Stroke Daughter   . Heart attack Daughter   . Colon cancer Neg Hx     Allergies  Allergen Reactions  . Penicillins Other (See Comments)    Okay to take amoxicillin/(pt does not recall what the reaction to penicillin was (28-71 years old)   . Sulfa Antibiotics Rash     Review of Systems   Review of Systems: Negative Unless Checked Constitutional: [] Weight loss  [] Fever  [] Chills Cardiac: [] Chest pain   []  Atrial Fibrillation  [] Palpitations   [] Shortness of breath when laying flat   [] Shortness of breath with exertion. [] Shortness of breath at rest Vascular:  [] Pain in legs with walking   [] Pain in legs with standing [] Pain in legs when laying flat   [] Claudication    [] Pain in feet when laying flat    [] History of DVT   [] Phlebitis   [x] Swelling in legs   [x] Varicose veins   [] Non-healing ulcers Pulmonary:   [] Uses home oxygen   [] Productive cough   [] Hemoptysis   [] Wheeze  [] COPD   [] Asthma Neurologic:  [] Dizziness   [] Seizures  [] Blackouts [] History of stroke   [] History of TIA  [] Aphasia   [] Temporary Blindness   [] Weakness or numbness in arm   [] Weakness or numbness in leg Musculoskeletal:   [] Joint swelling   [x] Joint pain   [x] Low back pain  []  History of  Knee Replacement [] Arthritis [] back Surgeries  []  Spinal Stenosis    Hematologic:  [] Easy bruising  [] Easy bleeding   [] Hypercoagulable state   [] Anemic Gastrointestinal:  [] Diarrhea   [] Vomiting  [x] Gastroesophageal reflux/heartburn   [] Difficulty swallowing. [] Abdominal pain Genitourinary:  [] Chronic kidney disease   []   Difficult urination  [] Anuric   [] Blood in urine [] Frequent urination  [] Burning with urination   [] Hematuria Skin:  [] Rashes   [] Ulcers [] Wounds Psychological:  [] History of anxiety   []  History of major depression  []  Memory Difficulties     Objective:   Physical Exam  BP 130/70 (BP Location: Right Arm, Patient Position: Sitting, Cuff Size: Normal)   Pulse (!) 56   Resp 16   Ht 4\' 11"  (1.499 m)   Wt 187 lb 12.8 oz (85.2 kg)   LMP 05/01/1972   BMI 37.93 kg/m   Gen: WD/WN, NAD Head: [place]/AT, No temporalis wasting.  Ear/Nose/Throat: Hearing grossly intact, nares w/o erythema or drainage Eyes: PER, EOMI, sclera nonicteric.  Neck: Supple, no masses.  No JVD.  Pulmonary:  Good air movement, no use of accessory muscles.  Cardiac: RRR Vascular:  Vessel Right Left  Radial Palpable Palpable  Dorsalis Pedis Palpable Palpable  Posterior Tibial Palpable Palpable   Gastrointestinal: soft, non-distended. No guarding/no peritoneal signs.  Musculoskeletal: M/S 5/5 throughout.  No deformity or atrophy.  Neurologic: Pain and light touch intact in extremities.  Symmetrical.  Speech is fluent. Motor exam as listed above. Psychiatric: Judgment intact, Mood & affect appropriate for pt's clinical situation. Dermatologic: No Venous rashes. No Ulcers Noted.  No changes consistent with cellulitis. Lymph : No Cervical lymphadenopathy, no lichenification or skin changes of chronic lymphedema.      Assessment & Plan:   1. Lymphedema  No surgery or intervention at this point in time.    I have reviewed my discussion with the patient regarding lymphedema and why it  causes symptoms.   Patient will continue wearing graduated compression stockings class 1 (20-30 mmHg) on a daily basis a prescription was given. The patient is reminded to put the stockings on first thing in the morning and removing them in the evening. The patient is instructed specifically not to sleep in the stockings.   In addition, behavioral modification throughout the day will be continued.  This will include frequent elevation (such as in a recliner), use of over the counter pain medications as needed and exercise such as walking.  I have reviewed systemic causes for chronic edema such as liver, kidney and cardiac etiologies and there does not appear to be any significant changes in these organ systems over the past year.  The patient is under the impression that these organ systems are all stable and unchanged.    The patient will continue aggressive use of the  lymph pump.  This will continue to improve the edema control and prevent sequela such as ulcers and infections.   The patient will follow-up with me in 6 months.    2. Essential hypertension, benign Continue antihypertensive medications as already ordered, these medications have been reviewed and there are no changes at this time.   3. Gastroesophageal reflux disease without esophagitis Continue PPI as already ordered, this medication has been reviewed and there are no changes at this time.  Avoidence of caffeine and alcohol  Moderate elevation of the head of the bed    Current Outpatient Medications on File Prior to Visit  Medication Sig Dispense Refill  . acetaminophen (TYLENOL) 500 MG tablet Take 500 mg by mouth daily as needed for headache (pain).    Marland Kitchen albuterol (PROVENTIL HFA;VENTOLIN HFA) 108 (90 Base) MCG/ACT inhaler Inhale 2 puffs into the lungs every 6 (six) hours as needed for wheezing or shortness of breath. 1 Inhaler 6  . amiodarone (PACERONE)  200 MG tablet TAKE ONE (1) TABLET EACH DAY 90 tablet 0  . apixaban (ELIQUIS) 5 MG TABS  tablet Take 1 tablet (5 mg total) by mouth 2 (two) times daily. 180 tablet 0  . azelastine (ASTELIN) 0.1 % nasal spray Place 2 sprays into both nostrils daily as needed for rhinitis. Use in each nostril as directed    . fish oil-omega-3 fatty acids 1000 MG capsule Take 1 g by mouth 2 (two) times daily.     . fluticasone furoate-vilanterol (BREO ELLIPTA) 100-25 MCG/INH AEPB Inhale 1 puff into the lungs daily. 60 each 3  . furosemide (LASIX) 40 MG tablet Take 1 tablet (40 mg) in the AM then take 0.5 tablet (20 mg) in the PM. 135 tablet 3  . hydrALAZINE (APRESOLINE) 10 MG tablet Take 1 tablet (10 mg total) by mouth 3 (three) times daily. 270 tablet 0  . losartan (COZAAR) 100 MG tablet Take 1 tablet (100 mg total) by mouth daily. 90 tablet 0  . Menthol, Topical Analgesic, (BIOFREEZE EX) Apply 1 application topically daily as needed (pain).    . Misc Natural Products (OSTEO BI-FLEX JOINT SHIELD PO) Take 1 tablet by mouth 2 (two) times daily.     . Multiple Vitamin (MULTIVITAMIN WITH MINERALS) TABS tablet Take 0.5 tablets by mouth 2 (two) times daily.    . pantoprazole (PROTONIX) 40 MG tablet TAKE ONE (1) TABLET EACH DAY 90 tablet 0  . Polyethyl Glycol-Propyl Glycol (SYSTANE OP) Place 1 drop into both eyes daily as needed (dry eyes).    . potassium chloride (K-DUR) 10 MEQ tablet Take 2 tablets (20 mEq total) by mouth daily. 180 tablet 0  . propranolol (INDERAL) 20 MG tablet Take 1 tablet (20 mg total) by mouth 3 (three) times daily. 270 tablet 0  . rosuvastatin (CRESTOR) 40 MG tablet TAKE ONE (1) TABLET EACH DAY 90 tablet 0  . cholecalciferol (VITAMIN D) 1000 units tablet Take 1,000 Units by mouth 2 (two) times daily.    . nitroGLYCERIN (NITROSTAT) 0.4 MG SL tablet Place 1 tablet (0.4 mg total) under the tongue every 5 (five) minutes as needed. (Patient not taking: Reported on 05/23/2018) 25 tablet 0  . PRESCRIPTION MEDICATION Inhale into the lungs at bedtime. CPAP    . traMADol (ULTRAM) 50 MG tablet Take  50 mg by mouth every 8 (eight) hours as needed.     No current facility-administered medications on file prior to visit.     There are no Patient Instructions on file for this visit. No follow-ups on file.   Kris Hartmann, NP  This note was completed with Sales executive.  Any errors are purely unintentional.

## 2018-05-27 DIAGNOSIS — X32XXXA Exposure to sunlight, initial encounter: Secondary | ICD-10-CM | POA: Diagnosis not present

## 2018-05-27 DIAGNOSIS — D2272 Melanocytic nevi of left lower limb, including hip: Secondary | ICD-10-CM | POA: Diagnosis not present

## 2018-05-27 DIAGNOSIS — I872 Venous insufficiency (chronic) (peripheral): Secondary | ICD-10-CM | POA: Diagnosis not present

## 2018-05-27 DIAGNOSIS — L570 Actinic keratosis: Secondary | ICD-10-CM | POA: Diagnosis not present

## 2018-05-27 DIAGNOSIS — D225 Melanocytic nevi of trunk: Secondary | ICD-10-CM | POA: Diagnosis not present

## 2018-05-27 DIAGNOSIS — L821 Other seborrheic keratosis: Secondary | ICD-10-CM | POA: Diagnosis not present

## 2018-05-27 DIAGNOSIS — D2261 Melanocytic nevi of right upper limb, including shoulder: Secondary | ICD-10-CM | POA: Diagnosis not present

## 2018-05-27 DIAGNOSIS — D2262 Melanocytic nevi of left upper limb, including shoulder: Secondary | ICD-10-CM | POA: Diagnosis not present

## 2018-05-27 DIAGNOSIS — D2271 Melanocytic nevi of right lower limb, including hip: Secondary | ICD-10-CM | POA: Diagnosis not present

## 2018-05-28 DIAGNOSIS — H02831 Dermatochalasis of right upper eyelid: Secondary | ICD-10-CM | POA: Diagnosis not present

## 2018-06-02 NOTE — Progress Notes (Signed)
Cardiology Office Note  Date:  06/03/2018   ID:  April [name], DOB 1941-02-22, MRN [id number]  PCP:  Einar Pheasant, MD   Chief Complaint  Patient presents with  . other    4-6 wk f/u c/o fatigue and back pain. Meds reviewed verbally with pt.    HPI:  April [name] is a pleasant 78 year old woman with history of  coronary artery disease,  40% proximal RCA disease on cardiac catheterization in 01/2010,  chronic shortness of breath  STEMI November 2017 stent x2 to her RCA atrial fibrillation while in the hospital November 2017 who presents for follow-up of her coronary artery disease   In follow-up today she reports that she is not very active, sedentary, weight trending upwards Limited in exercise and activities by her chronic back pain Previously seen by Dr. Arnoldo Morale in [place]  Reports having episode of chest pain while she was at the beach Took nitroglycerin x2, did not seem to help Went to urgent care initially Was sent to hospital 01/2018 Was seen at [facility], there was troponin elevation 0.07 down to 0.049 on October 7 and 8   Echo  1. Normal biventricular systolic function and size. LVEF 55-60%.  2. No significant valvular dysfunction.  3. Grade II diastolic dysfunction with elevated left-sided filling  pressures.  4. RVSP 41 mmHg consistent with mild pulmonary hypertension  Full cardiac catheterization report is not available but discharge summary reports stents were patent to the RCA  L[facility] at the [facility]  Lab work reviewed  hemoglobin A1c 6.1 Total chol 143, LDL 50s  EKG personally reviewed by myself on todays visit Shows sinus bradycardia rate 56 bpm poor R wave progression to the anterior precordial leads, unable to exclude old anterior MI, left axis deviation  Other past medical history reviewed Seen in the emergency room 11/11/2016 for chest pain  pain was musculoskeletal from lifting heavy items as she is moving  Had chest  pressure, sweating November 2017 in the setting of her STEMI "looked white"  Echocardiogram in 2015 showing normal ejection fraction, borderline elevated right ventricular systolic pressures, diastolic relaxation abnormality.   PMH:   has a past medical history of CAD S/P percutaneous coronary angioplasty, CKD (chronic kidney disease), stage III ([place]), Diastolic dysfunction, Diverticulitis, Endometriosis, Hiatal hernia, History of colon polyps (08/1994), History of Migraines, Hypercholesterolemia, Hypertension, LBBB (left bundle branch block), Osteoporosis, PAF (paroxysmal atrial fibrillation) ([place]), Rheumatic fever, Sleep apnea, ST elevation myocardial infarction (STEMI) of inferolateral wall, initial episode of care ([place]) (03/25/2016), and Urine incontinence.  PSH:    Past Surgical History:  Procedure Laterality Date  . ABDOMINAL HYSTERECTOMY     ovaries not removed  . APPENDECTOMY     was removed during hysterectomy  . Breast biopsies     x2  . BREAST BIOPSY Bilateral "years ago"  . BREAST SURGERY Bilateral   . CARDIAC CATHETERIZATION  2011   moderate 40% RCA disease  . CARDIAC CATHETERIZATION  01/2010   Dr. @[facility] : Only noted 40% RCA  . CARDIAC CATHETERIZATION N/A 03/25/2016   Procedure: Left Heart Cath and Coronary Angiography;  Surgeon: 04/07/2016, MD;  Location: [facility];  Service: Cardiovascular;  Laterality: N/A;  . CARDIAC CATHETERIZATION N/A 03/25/2016   Procedure: Coronary Stent Intervention;  Surgeon: 04/07/2016, MD;  Location: [facility];  Service: Cardiovascular;  Laterality: N/A;  . COLONOSCOPY  2013  . ESOPHAGOGASTRODUODENOSCOPY (EGD) WITH PROPOFOL N/A 03/17/2015   Procedure: ESOPHAGOGASTRODUODENOSCOPY (EGD)  WITH PROPOFOL;  Surgeon: Christene Lye, MD;  Location: [facility] ENDOSCOPY;  Service: Gastroenterology;  Laterality: N/A;  . NM MYOVIEW ([place] HX)  07/2015   No evidence ischemia or infarction. EF 66%. Low risk  . TONSILLECTOMY     . TRANSTHORACIC ECHOCARDIOGRAM  10/2013   Normal LV size and function. EF 55-65%. GR 1 DD. Otherwise normal.    Current Outpatient Medications  Medication Sig Dispense Refill  . acetaminophen (TYLENOL) 500 MG tablet Take 500 mg by mouth daily as needed for headache (pain).    Marland Kitchen albuterol (PROVENTIL HFA;VENTOLIN HFA) 108 (90 Base) MCG/ACT inhaler Inhale 2 puffs into the lungs every 6 (six) hours as needed for wheezing or shortness of breath. 1 Inhaler 6  . amiodarone (PACERONE) 200 MG tablet TAKE ONE (1) TABLET EACH DAY 90 tablet 0  . apixaban (ELIQUIS) 5 MG TABS tablet Take 1 tablet (5 mg total) by mouth 2 (two) times daily. 180 tablet 0  . azelastine (ASTELIN) 0.1 % nasal spray Place 2 sprays into both nostrils daily as needed for rhinitis. Use in each nostril as directed    . cholecalciferol (VITAMIN D) 1000 units tablet Take 1,000 Units by mouth 2 (two) times daily.    . fish oil-omega-3 fatty acids 1000 MG capsule Take 1 g by mouth 2 (two) times daily.     . fluticasone furoate-vilanterol (BREO ELLIPTA) 100-25 MCG/INH AEPB Inhale 1 puff into the lungs daily. 60 each 3  . furosemide (LASIX) 40 MG tablet Take 1 tablet (40 mg) in the AM then take 0.5 tablet (20 mg) in the PM. 135 tablet 3  . hydrALAZINE (APRESOLINE) 10 MG tablet Take 1 tablet (10 mg total) by mouth 3 (three) times daily. 270 tablet 0  . losartan (COZAAR) 100 MG tablet Take 1 tablet (100 mg total) by mouth daily. 90 tablet 0  . Menthol, Topical Analgesic, (BIOFREEZE EX) Apply 1 application topically daily as needed (pain).    . Misc Natural Products (OSTEO BI-FLEX JOINT SHIELD PO) Take 1 tablet by mouth 2 (two) times daily.     . Multiple Vitamin (MULTIVITAMIN WITH MINERALS) TABS tablet Take 0.5 tablets by mouth 2 (two) times daily.    . nitroGLYCERIN (NITROSTAT) 0.4 MG SL tablet Place 1 tablet (0.4 mg total) under the tongue every 5 (five) minutes as needed. 25 tablet 0  . pantoprazole (PROTONIX) 40 MG tablet TAKE ONE (1)  TABLET EACH DAY 90 tablet 0  . Polyethyl Glycol-Propyl Glycol (SYSTANE OP) Place 1 drop into both eyes daily as needed (dry eyes).    . potassium chloride (K-DUR) 10 MEQ tablet Take 2 tablets (20 mEq total) by mouth daily. 180 tablet 0  . PRESCRIPTION MEDICATION Inhale into the lungs at bedtime. CPAP    . propranolol (INDERAL) 20 MG tablet Take 1 tablet (20 mg total) by mouth 3 (three) times daily. 270 tablet 0  . rosuvastatin (CRESTOR) 40 MG tablet TAKE ONE (1) TABLET EACH DAY 90 tablet 0  . traMADol (ULTRAM) 50 MG tablet Take 50 mg by mouth every 8 (eight) hours as needed.     No current facility-administered medications for this visit.      Allergies:   Penicillins and Sulfa antibiotics   Social History:  The patient  reports that she has never smoked. She has never used smokeless tobacco. She reports that she does not drink alcohol or use drugs.   Family History:   family history includes Arthritis in her mother; Breast cancer in  an other family member; Heart attack in her daughter, father, and sister; Heart disease in her father; Hypertension in her mother; Osteoporosis in her mother; Stroke in her brother, daughter, and mother.    Review of Systems: Review of Systems  Respiratory: Negative.   Cardiovascular: Negative.   Gastrointestinal: Negative.   Musculoskeletal: Negative.   Neurological: Negative.   Psychiatric/Behavioral: Negative.   All other systems reviewed and are negative.   PHYSICAL EXAM: VS:  BP (!) 118/58 (BP Location: Left Arm, Patient Position: Sitting, Cuff Size: Normal)   Pulse (!) 56   Ht 4\' 11"  (1.499 m)   Wt 189 lb 8 oz (86 kg)   LMP 05/01/1972   BMI 38.27 kg/m  , BMI Body mass index is 38.27 kg/m. Constitutional:  oriented to person, place, and time. No distress. obese HENT:  Head: Normocephalic and atraumatic.  Eyes:  no discharge. No scleral icterus.  Neck: Normal range of motion. Neck supple. No JVD present.  Cardiovascular:Regular rate and  rhythm,  normal heart sounds and intact distal pulses. Exam reveals no gallop and no friction rub. No edema No murmur heard. Pulmonary/Chest: Effort normal and breath sounds normal. No stridor. No respiratory distress.  no wheezes.  no rales.  no tenderness.  Abdominal: Soft.  no distension.  no tenderness.  Musculoskeletal: Normal range of motion.  no  tenderness or deformity.  Neurological:  normal muscle tone. Coordination normal. No atrophy Skin: Skin is warm and dry. No rash noted. not diaphoretic.  Psychiatric:  normal mood and affect. behavior is normal. Thought content normal.    Recent Labs: 08/07/2017: TSH 3.30 12/07/2017: Hemoglobin 12.8; Platelets 219.0 04/17/2018: ALT 25 04/29/2018: BUN 26; Creatinine, Ser 1.37; Potassium 4.9; Sodium 147    Lipid Panel Lab Results  Component Value Date   CHOL 143 04/17/2018   HDL 71.80 04/17/2018   LDLCALC 53 04/17/2018   TRIG 92.0 04/17/2018      Wt Readings from Last 3 Encounters:  06/03/18 189 lb 8 oz (86 kg)  05/23/18 187 lb 12.8 oz (85.2 kg)  05/09/18 190 lb 9.6 oz (86.5 kg)     ASSESSMENT AND PLAN:  Essential hypertension, benign Blood pressure is well controlled on today's visit. No changes made to the medications.  Hypercholesterolemia Cholesterol at goal, continue current medications  Coronary artery disease involving native coronary artery of native heart with unstable angina pectoris (HCC) CAD S/P PCI pRCA Promus DES 3.5 x 16 (4.1 mm), ostRPDA Promus DES 2.5 x 12 (2.7 mm) Given recent chest pain, elevation of troponin,  aspirin 81 mg daily with Eliquis Patent stents on catheterization done at [facility] by report  Paroxysmal atrial fibrillation Maintaining normal sinus rhythm,  continue anticoagulation, elevated CHADS VASC (at least 4) Denies any recent tachycardia or palpitations concerning for arrhythmia  SOB (shortness of breath) Recommended weight loss, walking program for  conditioning Recommend low carbohydrate diet  Total encounter time more than 25 minutes Greater than 50% was spent in counseling and coordination of care with the patient  Disposition:   F/U  12 months   Orders Placed This Encounter  Procedures  . EKG 12-Lead     Signed, Esmond Plants, M.D., Ph.D. 06/03/2018  [place], [place]

## 2018-06-03 ENCOUNTER — Ambulatory Visit (INDEPENDENT_AMBULATORY_CARE_PROVIDER_SITE_OTHER): Payer: Medicare Other | Admitting: Cardiovascular Disease

## 2018-06-03 ENCOUNTER — Encounter: Payer: Self-pay | Admitting: Cardiovascular Disease

## 2018-06-03 VITALS — BP 118/58 | HR 56 | Ht 59.0 in | Wt 189.5 lb

## 2018-06-03 DIAGNOSIS — N183 Chronic kidney disease, stage 3 unspecified: Secondary | ICD-10-CM | POA: Insufficient documentation

## 2018-06-03 DIAGNOSIS — E785 Hyperlipidemia, unspecified: Secondary | ICD-10-CM

## 2018-06-03 DIAGNOSIS — I480 Paroxysmal atrial fibrillation: Secondary | ICD-10-CM | POA: Diagnosis not present

## 2018-06-03 DIAGNOSIS — I25110 Atherosclerotic heart disease of native coronary artery with unstable angina pectoris: Secondary | ICD-10-CM

## 2018-06-03 DIAGNOSIS — N179 Acute kidney failure, unspecified: Secondary | ICD-10-CM | POA: Insufficient documentation

## 2018-06-03 DIAGNOSIS — I5032 Chronic diastolic (congestive) heart failure: Secondary | ICD-10-CM | POA: Insufficient documentation

## 2018-06-03 DIAGNOSIS — I10 Essential (primary) hypertension: Secondary | ICD-10-CM | POA: Diagnosis not present

## 2018-06-03 DIAGNOSIS — N184 Chronic kidney disease, stage 4 (severe): Secondary | ICD-10-CM | POA: Insufficient documentation

## 2018-06-03 NOTE — Patient Instructions (Signed)
Medication Instructions:  Please restart aspirin 81 mg daily  If you need a refill on your cardiac medications before your next appointment, please call your pharmacy.    Lab work: No new labs needed   If you have labs (blood work) drawn today and your tests are completely normal, you will receive your results only by: Marland Kitchen MyChart Message (if you have MyChart) OR . A paper copy in the mail If you have any lab test that is abnormal or we need to change your treatment, we will call you to review the results.   Testing/Procedures: No new testing needed   Follow-Up: At [facility], you and your health needs are our priority.  As part of our continuing mission to provide you with exceptional heart care, we have created designated Provider Care Teams.  These Care Teams include your primary Cardiologist (physician) and Advanced Practice Providers (APPs -  Physician Assistants and Nurse Practitioners) who all work together to provide you with the care you need, when you need it.  . You will need a follow up appointment in 12 months .   Please call our office 2 months in advance to schedule this appointment.    . Providers on your designated Care Team:   . Murray Hodgkins, NP . Christell Faith, PA-C . Marrianne Mood, PA-C  Any Other Special Instructions Will Be Listed Below (If Applicable).  For educational health videos Log in to : [email or web address] Or : [email or web address], password : [email or web address]

## 2018-06-11 ENCOUNTER — Other Ambulatory Visit: Payer: Self-pay

## 2018-06-11 ENCOUNTER — Ambulatory Visit (INDEPENDENT_AMBULATORY_CARE_PROVIDER_SITE_OTHER): Payer: Medicare Other | Admitting: General Surgery

## 2018-06-11 ENCOUNTER — Encounter: Payer: Self-pay | Admitting: General Surgery

## 2018-06-11 VITALS — BP 161/75 | HR 58 | Temp 97.8°F | Resp 15 | Ht 60.0 in | Wt 192.0 lb

## 2018-06-11 DIAGNOSIS — Z86010 Personal history of colonic polyps: Secondary | ICD-10-CM | POA: Diagnosis not present

## 2018-06-11 DIAGNOSIS — I25110 Atherosclerotic heart disease of native coronary artery with unstable angina pectoris: Secondary | ICD-10-CM

## 2018-06-11 NOTE — Patient Instructions (Signed)
Patient may do the color guard test. Follow up appointment to be announced.

## 2018-06-11 NOTE — Progress Notes (Signed)
Patient ID: April [name], female   DOB: [birth year], 78 y.o.   MRN: [id number]  Chief Complaint  Patient presents with  . Colonoscopy    HPI April [name] is a 78 y.o. female here today to have a evaluation of a screening colonoscopy. Last colonoscopy was 2013 Patient states no GI problems at this time. Moves her bowels daily. Patient states the prep for the colonoscopy makes her sick.   Past Medical History:  Diagnosis Date  . CAD S/P percutaneous coronary angioplasty    a. 07/2015 MV: No ischemia, EF 66%;  b. 03/2016 Inflat STEMI/PCI: LM nl, LAD 25d, RI nl, LCX nl, OM1/2 nl, RCA 80p (3.5x16 Promus Premier DES), 50p/m, 30d, RPDA 90 (2.5x12 Promu Premier DES); c. 01/2018 Cath ([place], [place]): Patent RCA stents->Med Rx.  . CKD (chronic kidney disease), stage III ([place])   . Diastolic dysfunction    a. 10/2013 Echo: EF 55-65%, Gr1 DD; b. 01/2018 Echo ([place], [place]): EF 55-60%, Gr2 DD, RVSP 43mmHg.  . Diverticulitis   . Endometriosis   . Hiatal hernia   . History of colon polyps 08/1994  . History of Migraines   . Hypercholesterolemia   . Hypertension   . LBBB (left bundle branch block)   . Osteoporosis   . PAF (paroxysmal atrial fibrillation) ([place])    a. 03/2016 Dx @ time of MI; b. CHA2DS2VASc = 5-->Eliquis.  . Rheumatic fever   . Sleep apnea    CPAP  . ST elevation myocardial infarction (STEMI) of inferolateral wall, initial episode of care ([place]) 03/25/2016  . Urine incontinence     Past Surgical History:  Procedure Laterality Date  . ABDOMINAL HYSTERECTOMY     ovaries not removed  . APPENDECTOMY     was removed during hysterectomy  . Breast biopsies     x2  . BREAST BIOPSY Bilateral "years ago"  . BREAST SURGERY Bilateral   . CARDIAC CATHETERIZATION  2011   moderate 40% RCA disease  . CARDIAC CATHETERIZATION  01/2010   Dr. Gollan@[facility] : Only noted 40% RCA  . CARDIAC CATHETERIZATION N/A 03/25/2016   Procedure: Left Heart Cath and Coronary  Angiography;  Surgeon: 04/07/2016, MD;  Location: [facility];  Service: Cardiovascular;  Laterality: N/A;  . CARDIAC CATHETERIZATION N/A 03/25/2016   Procedure: Coronary Stent Intervention;  Surgeon: 04/07/2016, MD;  Location: [facility];  Service: Cardiovascular;  Laterality: N/A;  . COLONOSCOPY  2013  . ESOPHAGOGASTRODUODENOSCOPY (EGD) WITH PROPOFOL N/A 03/17/2015   Procedure: ESOPHAGOGASTRODUODENOSCOPY (EGD) WITH PROPOFOL;  Surgeon: 03/30/2015, MD;  Location: [facility];  Service: Gastroenterology;  Laterality: N/A;  . NM MYOVIEW ([place] [facility])  07/2015   No evidence ischemia or infarction. EF 66%. Low risk  . TONSILLECTOMY    . TRANSTHORACIC ECHOCARDIOGRAM  10/2013   Normal LV size and function. EF 55-65%. GR 1 DD. Otherwise normal.    Family History  Problem Relation Age of Onset  . Arthritis Mother   . Stroke Mother   . Hypertension Mother   . Osteoporosis Mother   . Heart disease Father        MI  . Heart attack Father   . Stroke Brother   . Heart attack Sister   . Breast cancer Other        first cousin x 2  . Stroke Daughter   . Heart attack Daughter   . Colon cancer Neg Hx  Social History Social History   Tobacco Use  . Smoking status: Never Smoker  . Smokeless tobacco: Never Used  Substance Use Topics  . Alcohol use: No    Alcohol/week: 0.0 standard drinks  . Drug use: No    Allergies  Allergen Reactions  . Penicillins Other (See Comments)    Okay to take amoxicillin/(pt does not recall what the reaction to penicillin was (43-56 years old)   . Sulfa Antibiotics Rash    Current Outpatient Medications  Medication Sig Dispense Refill  . acetaminophen (TYLENOL) 500 MG tablet Take 500 mg by mouth daily as needed for headache (pain).    Marland Kitchen albuterol (PROVENTIL HFA;VENTOLIN HFA) 108 (90 Base) MCG/ACT inhaler Inhale 2 puffs into the lungs every 6 (six) hours as needed for wheezing or shortness of breath. 1 Inhaler 6  .  amiodarone (PACERONE) 200 MG tablet TAKE ONE (1) TABLET EACH DAY 90 tablet 0  . apixaban (ELIQUIS) 5 MG TABS tablet Take 1 tablet (5 mg total) by mouth 2 (two) times daily. 180 tablet 0  . aspirin EC 81 MG tablet Take 1 tablet (81 mg total) by mouth daily. 90 tablet 3  . azelastine (ASTELIN) 0.1 % nasal spray Place 2 sprays into both nostrils daily as needed for rhinitis. Use in each nostril as directed    . cholecalciferol (VITAMIN D) 1000 units tablet Take 1,000 Units by mouth 2 (two) times daily.    . fish oil-omega-3 fatty acids 1000 MG capsule Take 1 g by mouth 2 (two) times daily.     . fluticasone furoate-vilanterol (BREO ELLIPTA) 100-25 MCG/INH AEPB Inhale 1 puff into the lungs daily. 60 each 3  . furosemide (LASIX) 40 MG tablet Take 1 tablet (40 mg) in the AM then take 0.5 tablet (20 mg) in the PM. 135 tablet 3  . hydrALAZINE (APRESOLINE) 10 MG tablet Take 1 tablet (10 mg total) by mouth 3 (three) times daily. 270 tablet 0  . losartan (COZAAR) 100 MG tablet Take 1 tablet (100 mg total) by mouth daily. 90 tablet 0  . Menthol, Topical Analgesic, (BIOFREEZE EX) Apply 1 application topically daily as needed (pain).    . Misc Natural Products (OSTEO BI-FLEX JOINT SHIELD PO) Take 1 tablet by mouth 2 (two) times daily.     . Multiple Vitamin (MULTIVITAMIN WITH MINERALS) TABS tablet Take 0.5 tablets by mouth 2 (two) times daily.    . nitroGLYCERIN (NITROSTAT) 0.4 MG SL tablet Place 1 tablet (0.4 mg total) under the tongue every 5 (five) minutes as needed. 25 tablet 0  . pantoprazole (PROTONIX) 40 MG tablet TAKE ONE (1) TABLET EACH DAY 90 tablet 0  . Polyethyl Glycol-Propyl Glycol (SYSTANE OP) Place 1 drop into both eyes daily as needed (dry eyes).    . potassium chloride (K-DUR) 10 MEQ tablet Take 2 tablets (20 mEq total) by mouth daily. 180 tablet 0  . PRESCRIPTION MEDICATION Inhale into the lungs at bedtime. CPAP    . propranolol (INDERAL) 20 MG tablet Take 1 tablet (20 mg total) by mouth 3  (three) times daily. 270 tablet 0  . rosuvastatin (CRESTOR) 40 MG tablet TAKE ONE (1) TABLET EACH DAY 90 tablet 0  . traMADol (ULTRAM) 50 MG tablet Take 50 mg by mouth every 8 (eight) hours as needed.     No current facility-administered medications for this visit.     Review of Systems Review of Systems  Constitutional: Negative.   Respiratory: Negative.   Cardiovascular: Negative.  Blood pressure (!) 161/75, pulse (!) 58, temperature 97.8 F (36.6 C), temperature source Skin, resp. rate 15, height 5' (1.524 m), weight 192 lb (87.1 kg), last menstrual period 05/01/1972, SpO2 98 %.  Physical Exam Physical Exam Constitutional:      Appearance: Normal appearance.  Cardiovascular:     Rate and Rhythm: Normal rate and regular rhythm.  Pulmonary:     Effort: Pulmonary effort is normal.     Breath sounds: Normal breath sounds.  Skin:    General: Skin is warm and dry.  Neurological:     Mental Status: She is alert and oriented to person, place, and time.     Data Reviewed Colonoscopy dated June 23, 2011 was reviewed.  Extensive diverticulosis.  Exam completed for personal history of a rectal adenoma.  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    Plan  The best I can tell from record review is of the single rectal polyp was about 2 decades ago.  It seems like the risk of a recurrent polyp at this late date is small.  The patient is not wildly interested about a repeat colonoscopy as she experienced severe nausea with her prep last time.  If she can be screened with Cologuard and if this was negative, I think it would be reasonable not to do a colonoscopy.  If she is not felt to be a candidate due to her distant history, pros and cons of colonoscopy were reviewed, but may be deferred by the patient.. Follow up appointment to be announced after we investigate Cologuard testing..   HPI, Physical Exam, Assessment and Plan have been  scribed under the direction and in the presence of Hervey Ard, MD.  Gaspar Cola, CMA  I have completed the exam and reviewed the above documentation for accuracy and completeness.  I agree with the above.  Haematologist has been used and any errors in dictation or transcription are unintentional.  Hervey Ard, M.D., F.A.C.S.  April [name] 06/12/2018, 7:20 PM

## 2018-06-12 ENCOUNTER — Telehealth: Payer: Self-pay | Admitting: *Deleted

## 2018-06-12 DIAGNOSIS — M5481 Occipital neuralgia: Secondary | ICD-10-CM | POA: Diagnosis not present

## 2018-06-12 DIAGNOSIS — R498 Other voice and resonance disorders: Secondary | ICD-10-CM | POA: Diagnosis not present

## 2018-06-12 DIAGNOSIS — G4733 Obstructive sleep apnea (adult) (pediatric): Secondary | ICD-10-CM | POA: Diagnosis not present

## 2018-06-12 NOTE — Telephone Encounter (Signed)
She is aware that an order will be placed for Cologuard testing and unclear as to whether insurance covers or not, she will call and check as well.Order form and documents faxed to [employer].

## 2018-06-12 NOTE — Telephone Encounter (Signed)
-----   Message from Robert Bellow, MD sent at 06/11/2018  8:57 PM EST ----- Please see if the patient would qualify for Cologuard testing. Thanks.

## 2018-06-20 DIAGNOSIS — Z1211 Encounter for screening for malignant neoplasm of colon: Secondary | ICD-10-CM | POA: Diagnosis not present

## 2018-06-20 DIAGNOSIS — Z1212 Encounter for screening for malignant neoplasm of rectum: Secondary | ICD-10-CM | POA: Diagnosis not present

## 2018-06-24 ENCOUNTER — Telehealth: Payer: Self-pay

## 2018-06-24 ENCOUNTER — Ambulatory Visit: Payer: Self-pay | Admitting: *Deleted

## 2018-06-24 NOTE — Telephone Encounter (Signed)
Patient has edema and cellulitis in her legs- yesterday was bad day- she had pins and needle sensation in legs. Sensation of fluid has gone up above her knee- she feels it under her arm. R leg is beginning to get red also. She does state the symptoms are better today- but the redness is starting in her R leg now.  No urgent appointment available at PCP- and patient does not want to go to another office- recommend patient go to UC locally today for evaluation of increased symptoms in legs. She will call back afterward for follow up in office.  Reason for Disposition . [1] Red area or streak [2] large (> 2 in. or 5 cm)  Answer Assessment - Initial Assessment Questions 1. ONSET: "When did the swelling start?" (e.g., minutes, hours, days)     Patient has had the leg swelling for some time- she does report elevation has helped her feet.  2. LOCATION: "What part of the leg is swollen?"  "Are both legs swollen or just one leg?"     Swelling in lower leg- R is beginning to swell now 3. SEVERITY: "How bad is the swelling?" (e.g., localized; mild, moderate, severe)  - Localized - small area of swelling localized to one leg  - MILD pedal edema - swelling limited to foot and ankle, pitting edema < 1/4 inch (6 mm) deep, rest and elevation eliminate most or all swelling  - MODERATE edema - swelling of lower leg to knee, pitting edema > 1/4 inch (6 mm) deep, rest and elevation only partially reduce swelling  - SEVERE edema - swelling extends above knee, facial or hand swelling present      moderate 4. REDNESS: "Does the swelling look red or infected?"     Redness in both legs- R is beginning to look red- L leg is purplish red 5. PAIN: "Is the swelling painful to touch?" If so, ask: "How painful is it?"   (Scale 1-10; mild, moderate or severe)     Tender to touch- sensation of pins and needles is present 6. FEVER: "Do you have a fever?" If so, ask: "What is it, how was it measured, and when did it start?"    no 7. CAUSE: "What do you think is causing the leg swelling?"     Edema/cellulits  8. MEDICAL HISTORY: "Do you have a history of heart failure, kidney disease, liver failure, or cancer?"     Heart attack with 2 stints 9. RECURRENT SYMPTOM: "Have you had leg swelling before?" If so, ask: "When was the last time?" "What happened that time?"     Yes- patient has had this since April- wrapped with blisters 10. OTHER SYMPTOMS: "Do you have any other symptoms?" (e.g., chest pain, difficulty breathing)       No- patent reports she is SOB all the time 11. PREGNANCY: "Is there any chance you are pregnant?" "When was your last menstrual period?"       n/a  Protocols used: LEG SWELLING AND EDEMA-A-AH

## 2018-06-24 NOTE — Telephone Encounter (Signed)
Patient returned CMA's call from earlier today.  Patient said that she went about 2 hours ago to see the nurse onsite at [facility].  Patient said that the nurse wrapped her left leg and recommended that she call her PCP if it gets worse.  Patient said that the swelling was not as bad today as yesterday.  Patient says that she expects the compression sleeves that was ordered by Dr. Lucky Cowboy to arrive soon.  Advised patient to call the office or go back to onsite RN if swelling worsens or go to Urgent Care if after hours.

## 2018-06-24 NOTE — Telephone Encounter (Signed)
Called patient to follow up to see if she went to Urgent Care this morning as was recommended by the [facility] RN.  No answer.  Left vm for pt to call back.

## 2018-06-25 ENCOUNTER — Ambulatory Visit: Payer: Medicare Other | Admitting: Internal Medicine

## 2018-06-25 NOTE — Telephone Encounter (Signed)
Please call pt for update.  Is seeing vascular surgery for her legs.  If persistent problems, will need to be evaluated.

## 2018-06-25 NOTE — Telephone Encounter (Signed)
Pt is seeing vascular for her legs. Currently waiting on the stockings that Dr Lucky Cowboy ordered to arrive. Nurse at [facility] wrapped her legs and that has helped. Advised that if persistent symptoms, she will need to be evaluated.

## 2018-06-26 LAB — COLOGUARD

## 2018-07-04 ENCOUNTER — Encounter: Payer: Self-pay | Admitting: General Surgery

## 2018-07-04 NOTE — Progress Notes (Unsigned)
Patient has been informed of her cologuard results per Dr.Byrnett.

## 2018-07-04 NOTE — Progress Notes (Signed)
Patient has been informed of her cologuard results per Dr.Byrnett.

## 2018-07-08 ENCOUNTER — Ambulatory Visit: Payer: Self-pay | Admitting: *Deleted

## 2018-07-08 NOTE — Telephone Encounter (Signed)
Pt called with having redness to her left leg. No drainage. It is prickly and painful to her. She had been wearing a sleeve and did not realize that if her leg was red, she should not wear it.  Denies fever. She has a hx of cellulitis in her legs and think she may have it again. Appointment scheduled per protocol. Advised to call back for any concerns or increase in symptoms. Pt voiced understanding.  Reason for Disposition . [1] Localized rash is very painful AND [2] no fever  Answer Assessment - Initial Assessment Questions 1. ONSET: "When did the pain start?"      All winter 2. LOCATION: "Where is the pain located?"      Left leg 3. PAIN: "How bad is the pain?"    (Scale 1-10; or mild, moderate, severe)   -  MILD (1-3): doesn't interfere with normal activities    -  MODERATE (4-7): interferes with normal activities (e.g., work or school) or awakens from sleep, limping    -  SEVERE (8-10): excruciating pain, unable to do any normal activities, unable to walk     Pain # 7 or 8 last night and this morning about a 5 4. WORK OR EXERCISE: "Has there been any recent work or exercise that involved this part of the body?"      Wearing a sleeve on her leg that uses pressure about a week and a half ago 5. CAUSE: "What do you think is causing the leg pain?"     cellulitis 6. OTHER SYMPTOMS: "Do you have any other symptoms?" (e.g., chest pain, back pain, breathing difficulty, swelling, rash, fever, numbness, weakness)     Swelling, some weakness in that leg 7. PREGNANCY: "Is there any chance you are pregnant?" "When was your last menstrual period?"     n/a  Protocols used: LEG PAIN-A-AH

## 2018-07-08 NOTE — Telephone Encounter (Signed)
FYI:  Pt has an appt with Guse, NP tomorrow (3/9) morning @ 8:40 am.

## 2018-07-09 ENCOUNTER — Encounter: Payer: Self-pay | Admitting: Family Medicine

## 2018-07-09 ENCOUNTER — Ambulatory Visit (INDEPENDENT_AMBULATORY_CARE_PROVIDER_SITE_OTHER): Payer: Medicare Other | Admitting: Family Medicine

## 2018-07-09 VITALS — BP 110/68 | HR 60 | Temp 97.7°F | Resp 20 | Ht 60.0 in | Wt 195.0 lb

## 2018-07-09 DIAGNOSIS — L03116 Cellulitis of left lower limb: Secondary | ICD-10-CM

## 2018-07-09 DIAGNOSIS — I890 Lymphedema, not elsewhere classified: Secondary | ICD-10-CM | POA: Diagnosis not present

## 2018-07-09 MED ORDER — MUPIROCIN 2 % EX OINT: 1.0000 "application " | TOPICAL_OINTMENT | Freq: Two times a day (BID) | CUTANEOUS | 0 refills | Status: DC

## 2018-07-09 MED ORDER — DOXYCYCLINE HYCLATE 100 MG PO TABS: 100.0000 mg | ORAL_TABLET | Freq: Two times a day (BID) | ORAL | 0 refills | Status: DC

## 2018-07-09 NOTE — Progress Notes (Signed)
Subjective:    Patient ID: April [name], female    DOB: 08/25/1940, 78 y.o.   MRN: [id number]  HPI   Patient presents to clinic due to redness and tenderness of skin of left leg.  Also has swelling in bilateral lower extremities, does have a history of lymphedema.  She follows regularly with vascular surgery for her lymphedema.  She is supposed to wear graduated compression stockings class I on a daily basis, she is to put them on first thing in the morning and remove them in the evening; she is not to sleep with stockings.  Patient also has been advised to elevate legs whenever able to, do regular exercise like walking as different behavioral modifications that can help lymphedema.  Patient has been trying to follow all these instructions, did notice some redness and tenderness of skin on left leg.  Patient became concerned due to having a history of wound on left leg that required wound care, and history of cellulitis of skin and lower extremity.  Denies fever or chills.  Denies any pain in foot/ankle/knee or hips.   Patient is anticoagulated on Eliquis due to history of atrial fibrillation.   Patient Active Problem List   Diagnosis Date Noted  . Chronic heart failure with preserved ejection fraction ([place]) 06/03/2018  . CKD (chronic kidney disease), stage III ([place]) 06/03/2018  . Lower extremity edema 01/19/2018  . Paroxysmal atrial fibrillation ([place]) 03/27/2016  . ST elevation myocardial infarction (STEMI) of inferolateral wall, initial episode of care ([place]) 03/25/2016  . CAD S/P PCI pRCA Promus DES 3.5 x 16 (4.1 mm), ostRPDA Promus DES 2.5 x 12 (2.7 mm) 03/25/2016  . Right knee pain 11/13/2015  . Coronary artery disease involving native coronary artery of native heart with unstable angina pectoris ([place]) 06/01/2015  . Diabetes mellitus ([place]) 05/16/2015  . Gastritis 04/01/2015  . Chest pain 04/01/2015  . Hiatal hernia 08/25/2014  . DOE (dyspnea on exertion) 07/15/2014  .  Congestion of throat 07/06/2014  . Health care maintenance 07/06/2014  . Fibrocystic breast disease 07/06/2014  . BMI 38.0-38.9,adult 04/19/2014  . Stress 02/16/2013  . Rib pain on right side 02/16/2013  . SOB (shortness of breath) 10/13/2012  . GERD (gastroesophageal reflux disease) 10/13/2012  . Essential hypertension, benign 08/18/2012  . Hypercholesterolemia 08/18/2012  . History of colonic polyps 08/18/2012  . Obstructive sleep apnea 08/18/2012  . Osteoporosis 08/18/2012   Social History   Tobacco Use  . Smoking status: Never Smoker  . Smokeless tobacco: Never Used  Substance Use Topics  . Alcohol use: No    Alcohol/week: 0.0 standard drinks   Review of Systems  Constitutional: Negative for chills, fatigue and fever.  HENT: Negative for congestion, ear pain, sinus pain and sore throat.   Eyes: Negative.   Respiratory: Negative for cough, shortness of breath and wheezing.   Cardiovascular: Negative for chest pain, palpitations and leg swelling.  Gastrointestinal: Negative for abdominal pain, diarrhea, nausea and vomiting.  Genitourinary: Negative for dysuria, frequency and urgency.  Musculoskeletal: Negative for arthralgias and myalgias.  Skin: +swelling, tenderness and redness of skin on left leg.  Neurological: Negative for syncope, light-headedness and headaches.  Psychiatric/Behavioral: The patient is not nervous/anxious.       Objective:   Physical Exam Vitals signs and nursing note reviewed.  Constitutional:      General: She is not in acute distress.    Appearance: She is not toxic-appearing.  HENT:     Head: Normocephalic and atraumatic.  Neck:     Musculoskeletal: Neck supple. No neck rigidity.  Cardiovascular:     Rate and Rhythm: Normal rate and regular rhythm.  Pulmonary:     Effort: Pulmonary effort is normal. No respiratory distress.     Breath sounds: Normal breath sounds. No wheezing, rhonchi or rales.  Musculoskeletal:     Comments: +1 bilat  LE edema, non pitting, lymphedema  Skin:    Findings: Erythema present.          Comments: Area of redness indicated by red mark on diagram. Area is slightly warm to touch. No open areas in skin see.   Neurological:     Mental Status: She is alert.     Today's Vitals   07/09/18 0844  BP: 110/68  Pulse: 60  Resp: 20  Temp: 97.7 F (36.5 C)  TempSrc: Oral  SpO2: 95%  Weight: 195 lb (88.5 kg)  Height: 5' (1.524 m)   Body mass index is 38.08 kg/m.     Assessment & Plan:   Cellulitis of left lower extremity, lymphedema - patient advised to follow-up instructions of her vascular specialist for treatment of lymphedema.  She will wash skin with mild soap and water, pat dry.  She will apply thin layer of ointment, and monitor skin for any worsening redness, pain or swelling.  She will take doxycycline twice daily for 10 days to treat cellulitis infection.  Patient will follow-up in 1 week for recheck on cellulitis to be sure it is improving.  Advised she can return to clinic sooner if any issues arise.  Patient also instructed to call office right away if symptoms worsen rather than slowly improve.

## 2018-07-09 NOTE — Patient Instructions (Signed)
Wash skin with mild soap and water. Pat dry. Apply thin layer of mupirocin ointment. Monitor for any worsening redness, pain, swelling  Keep legs elevated as much as possible

## 2018-07-18 ENCOUNTER — Telehealth: Payer: Self-pay | Admitting: Internal Medicine

## 2018-07-18 MED ORDER — TRIAMCINOLONE ACETONIDE 0.1 % EX CREA: 1.0000 "application " | TOPICAL_CREAM | Freq: Two times a day (BID) | CUTANEOUS | 0 refills | Status: DC

## 2018-07-18 NOTE — Telephone Encounter (Signed)
Spoke with patient. She was seen recently for cellulitis and given doxy for 10 days and mupirocin. She cancelled her appt with Lauren because she feels that she doesn't need to be seen. She does not like the mupirocin ointment because it makes her legs burn. Stated that the triamcinolone cream helps her. She is getting ready to start using the pumps on her legs from vascular. Pt stated she would come in if you thought it was necessary but she thinks she is okay. Confirmed no warmth, swelling, redness, pain, etc. She would like a refill on the triamcinolone

## 2018-07-18 NOTE — Telephone Encounter (Signed)
Copied from [place] [id number]. Topic: Quick Communication - Rx Refill/Question >> Jul 18, 2018 10:39 AM Gustavus Messing wrote: Medication: triamcinolone cream (KENALOG) 0.1 % [[id number]]    Has the patient contacted their pharmacy? No. (Agent: If no, request that the patient contact the pharmacy for the refill.) Patient would like to go back to the [place] because the other medication makes her led feel worse and burn   Preferred Pharmacy (with phone number or street name): [place], [place] - [address] [phone] (Phone) [phone] (Fax)    Agent: Please be advised that RX refills may take up to 3 business days. We ask that you follow-up with your pharmacy.

## 2018-07-18 NOTE — Telephone Encounter (Signed)
I have sent in a prescription for triamcinolone cream.  If she does not feel needs to come in, ok with her holding on f/u , but keep [place] posted on how doing.

## 2018-07-18 NOTE — Telephone Encounter (Signed)
Patient is aware and feels that she is okay. Going to wait on scheduling appt. Advised to call if needed

## 2018-07-19 ENCOUNTER — Other Ambulatory Visit: Payer: Self-pay | Admitting: Cardiovascular Disease

## 2018-07-19 ENCOUNTER — Ambulatory Visit: Payer: Medicare Other | Admitting: Family Medicine

## 2018-07-19 NOTE — Telephone Encounter (Signed)
Please review Eliquis for refill, Thanks !

## 2018-08-20 ENCOUNTER — Telehealth: Payer: Self-pay | Admitting: Internal Medicine

## 2018-08-20 ENCOUNTER — Ambulatory Visit (INDEPENDENT_AMBULATORY_CARE_PROVIDER_SITE_OTHER): Payer: Medicare Other | Admitting: Family Medicine

## 2018-08-20 ENCOUNTER — Other Ambulatory Visit: Payer: Self-pay

## 2018-08-20 ENCOUNTER — Other Ambulatory Visit (INDEPENDENT_AMBULATORY_CARE_PROVIDER_SITE_OTHER): Payer: Medicare Other

## 2018-08-20 VITALS — BP 130/70 | HR 53 | Temp 98.2°F | Wt 192.4 lb

## 2018-08-20 DIAGNOSIS — E1159 Type 2 diabetes mellitus with other circulatory complications: Secondary | ICD-10-CM | POA: Diagnosis not present

## 2018-08-20 DIAGNOSIS — H00014 Hordeolum externum left upper eyelid: Secondary | ICD-10-CM | POA: Diagnosis not present

## 2018-08-20 DIAGNOSIS — I10 Essential (primary) hypertension: Secondary | ICD-10-CM

## 2018-08-20 DIAGNOSIS — E7800 Pure hypercholesterolemia, unspecified: Secondary | ICD-10-CM

## 2018-08-20 LAB — HEPATIC FUNCTION PANEL
ALT: 27 U/L (ref 0–35)
AST: 28 U/L (ref 0–37)
Albumin: 3.9 g/dL (ref 3.5–5.2)
Alkaline Phosphatase: 71 U/L (ref 39–117)
Bilirubin, Direct: 0.1 mg/dL (ref 0.0–0.3)
Total Bilirubin: 0.7 mg/dL (ref 0.2–1.2)
Total Protein: 6.1 g/dL (ref 6.0–8.3)

## 2018-08-20 LAB — LIPID PANEL
Cholesterol: 165 mg/dL (ref 0–200)
HDL: 69.6 mg/dL (ref >39.00)
LDL Cholesterol: 80 mg/dL (ref 0–99)
NonHDL: 95.89
Total CHOL/HDL Ratio: 2
Triglycerides: 78 mg/dL (ref 0.0–149.0)
VLDL: 15.6 mg/dL (ref 0.0–40.0)

## 2018-08-20 LAB — TSH: TSH: 4.5 u[IU]/mL (ref 0.35–4.50)

## 2018-08-20 LAB — HEMOGLOBIN A1C: Hgb A1c MFr Bld: 6.2 % (ref 4.6–6.5)

## 2018-08-20 MED ORDER — POLYMYXIN B-TRIMETHOPRIM 10000-0.1 UNIT/ML-% OP SOLN: 1.0000 [drp] | OPHTHALMIC | 0 refills | Status: DC

## 2018-08-20 NOTE — Progress Notes (Signed)
Subjective:    Patient ID: April [name], female    DOB: [birth year], 78 y.o.   MRN: [id number]  HPI   Patient presents to clinic due to swelling of left upper eyelid.  Suspect she may have a stye or an eye infection.  Denies any loss of vision.  Denies any known injury to the eye.  Patient has not been anywhere out of the ordinary, has been remaining home, going to the grocery store once every 1 to 2 weeks.  Patient Active Problem List   Diagnosis Date Noted  . Lymphedema 07/09/2018  . Chronic heart failure with preserved ejection fraction ([place]) 06/03/2018  . CKD (chronic kidney disease), stage III ([place]) 06/03/2018  . Lower extremity edema 01/19/2018  . Paroxysmal atrial fibrillation ([place]) 03/27/2016  . ST elevation myocardial infarction (STEMI) of inferolateral wall, initial episode of care ([place]) 03/25/2016  . CAD S/P PCI pRCA Promus DES 3.5 x 16 (4.1 mm), ostRPDA Promus DES 2.5 x 12 (2.7 mm) 03/25/2016  . Right knee pain 11/13/2015  . Coronary artery disease involving native coronary artery of native heart with unstable angina pectoris ([place]) 06/01/2015  . Diabetes mellitus ([place]) 05/16/2015  . Gastritis 04/01/2015  . Chest pain 04/01/2015  . Hiatal hernia 08/25/2014  . DOE (dyspnea on exertion) 07/15/2014  . Congestion of throat 07/06/2014  . Health care maintenance 07/06/2014  . Fibrocystic breast disease 07/06/2014  . BMI 38.0-38.9,adult 04/19/2014  . Stress 02/16/2013  . Rib pain on right side 02/16/2013  . SOB (shortness of breath) 10/13/2012  . GERD (gastroesophageal reflux disease) 10/13/2012  . Essential hypertension, benign 08/18/2012  . Hypercholesterolemia 08/18/2012  . History of colonic polyps 08/18/2012  . Obstructive sleep apnea 08/18/2012  . Osteoporosis 08/18/2012   Social History   Tobacco Use  . Smoking status: Never Smoker  . Smokeless tobacco: Never Used  Substance Use Topics  . Alcohol use: No    Alcohol/week: 0.0 standard drinks   Review of  Systems   Constitutional: Negative for chills, fatigue and fever.  HENT: Negative for congestion, ear pain, sinus pain and sore throat.   Eyes: Puffing, redness and itching left upper eyelid Respiratory: Negative for cough, shortness of breath and wheezing.   Cardiovascular: Negative for chest pain, palpitations and leg swelling.  Gastrointestinal: Negative for abdominal pain, diarrhea, nausea and vomiting.  Genitourinary: Negative for dysuria, frequency and urgency.  Musculoskeletal: Negative for arthralgias and myalgias.  Skin: Negative for color change, pallor and rash.  Neurological: Negative for syncope, light-headedness and headaches.  Psychiatric/Behavioral: The patient is not nervous/anxious.       Objective:   Physical Exam Vitals signs and nursing note reviewed.  Constitutional:      General: She is not in acute distress.    Appearance: She is not toxic-appearing.  HENT:     Head: Normocephalic and atraumatic.     Right Ear: Tympanic membrane, ear canal and external ear normal.     Left Ear: Tympanic membrane, ear canal and external ear normal.     Nose: Nose normal.     Mouth/Throat:     Mouth: Mucous membranes are moist.  Eyes:     Pupils: Pupils are equal, round, and reactive to light.      Comments: 2 small styes represented by blue circles on left upper eyelid on diagram.  Styes have surrounding skin redness and puffiness.  Sclera appears clear.  Cardiovascular:     Rate and Rhythm: Normal rate.  Pulmonary:  Effort: Pulmonary effort is normal. No respiratory distress.  Neurological:     Mental Status: She is alert and oriented to person, place, and time.     Gait: Gait normal.  Psychiatric:        Mood and Affect: Mood normal.        Behavior: Behavior normal.     Vitals:   08/20/18 0948  BP: 130/70  Pulse: (!) 53  Temp: 98.2 F (36.8 C)  SpO2: 95%       Assessment & Plan:   Stye, left upper eyelid- patient will take Polytrim eyedrops to  cover for any bacterial infection in the eye.  Also advised to use warm compresses on the left eye to help reduce swelling of stye.  Instructed patient to dispose of any recently used eye make-up and also to change her pillowcase is to avoid re-irritating/reinfecting self.  Encourage patient to avoid rubbing and touching eyes much as possible.  Discussed diligent handwashing, wiping down commonly use surfaces and remaining home as much as possible throughout the COVID-19 pandemic.  Patient aware she can call office at any time if she has any questions or concerns and we will be happy to assist her.

## 2018-08-20 NOTE — Telephone Encounter (Signed)
Spoke with April [name] regarding AWV. Patient declined to schedule.

## 2018-08-20 NOTE — Patient Instructions (Signed)

## 2018-08-22 ENCOUNTER — Ambulatory Visit (INDEPENDENT_AMBULATORY_CARE_PROVIDER_SITE_OTHER): Payer: Medicare Other | Admitting: Internal Medicine

## 2018-08-22 ENCOUNTER — Other Ambulatory Visit: Payer: Self-pay | Admitting: Internal Medicine

## 2018-08-22 ENCOUNTER — Encounter: Payer: Self-pay | Admitting: Internal Medicine

## 2018-08-22 DIAGNOSIS — K219 Gastro-esophageal reflux disease without esophagitis: Secondary | ICD-10-CM | POA: Diagnosis not present

## 2018-08-22 DIAGNOSIS — R600 Localized edema: Secondary | ICD-10-CM | POA: Diagnosis not present

## 2018-08-22 DIAGNOSIS — N183 Chronic kidney disease, stage 3 unspecified: Secondary | ICD-10-CM

## 2018-08-22 DIAGNOSIS — G4733 Obstructive sleep apnea (adult) (pediatric): Secondary | ICD-10-CM

## 2018-08-22 DIAGNOSIS — Z86010 Personal history of colonic polyps: Secondary | ICD-10-CM

## 2018-08-22 DIAGNOSIS — F439 Reaction to severe stress, unspecified: Secondary | ICD-10-CM

## 2018-08-22 DIAGNOSIS — I2510 Atherosclerotic heart disease of native coronary artery without angina pectoris: Secondary | ICD-10-CM

## 2018-08-22 DIAGNOSIS — I480 Paroxysmal atrial fibrillation: Secondary | ICD-10-CM | POA: Diagnosis not present

## 2018-08-22 DIAGNOSIS — Z9861 Coronary angioplasty status: Secondary | ICD-10-CM

## 2018-08-22 DIAGNOSIS — E7800 Pure hypercholesterolemia, unspecified: Secondary | ICD-10-CM | POA: Diagnosis not present

## 2018-08-22 DIAGNOSIS — I10 Essential (primary) hypertension: Secondary | ICD-10-CM | POA: Diagnosis not present

## 2018-08-22 DIAGNOSIS — E1159 Type 2 diabetes mellitus with other circulatory complications: Secondary | ICD-10-CM

## 2018-08-22 NOTE — Progress Notes (Addendum)
Patient ID: April [name], female   DOB: [birth year], 78 y.o.   MRN: [id number] Virtual Visit via Telephone Note  This visit type was conducted due to national recommendations for restrictions regarding the COVID-19 pandemic (e.g. social distancing).  This format is felt to be most appropriate for this patient at this time.  All issues noted in this document were discussed and addressed.  No physical exam was performed (except for noted visual exam findings with Video Visits).   I connected with April [name] by telephone and verified that I am speaking with the correct person using two identifiers. Location patient: home Location provider: work  Persons participating in the telephone visit: patient, provider  I discussed the limitations, risks, security and privacy concerns of performing an evaluation and management service by telephone and the availability of in person appointments. The patient expressed understanding and agreed to proceed.   Reason for visit: scheduled follow up.   HPI: She was seen earlier this week for stye on her eye.  Was given abx drops.  Is better.  Trying to stay in.  No known COVID exposure.  No fever. No sob.  No chest  Congestion or cough.  Overall feels her breathing is stable. No chest pain.  Saw Dr Rockey Situ 06/03/18.  Stable.  Reports no increased acid reflux.  No abdominal pain.  Bowels moving.  Due to f/u with Dr Lucky Cowboy 10/2018.  Lower extremity swelling is better.  Discussed recent labs.  a1c 6.2.  Discussed low carb diet and exercise.  Using cpap.     ROS: See pertinent positives and negatives per HPI.  Past Medical History:  Diagnosis Date  . CAD S/P percutaneous coronary angioplasty    a. 07/2015 MV: No ischemia, EF 66%;  b. 03/2016 Inflat STEMI/PCI: LM nl, LAD 25d, RI nl, LCX nl, OM1/2 nl, RCA 80p (3.5x16 Promus Premier DES), 50p/m, 30d, RPDA 90 (2.5x12 Promu Premier DES); c. 01/2018 Cath ([place], [place]): Patent RCA stents->Med Rx.  . CKD (chronic  kidney disease), stage III ([place])   . Diastolic dysfunction    a. 10/2013 Echo: EF 55-65%, Gr1 DD; b. 01/2018 Echo ([place], [place]): EF 55-60%, Gr2 DD, RVSP 32mHg.  . Diverticulitis   . Endometriosis   . Hiatal hernia   . History of colon polyps 08/1994  . History of Migraines   . Hypercholesterolemia   . Hypertension   . LBBB (left bundle branch block)   . Osteoporosis   . PAF (paroxysmal atrial fibrillation) ([facility]    a. 03/2016 Dx @ time of MI; b. CHA2DS2VASc = 5-->Eliquis.  . Rheumatic fever   . Sleep apnea    CPAP  . ST elevation myocardial infarction (STEMI) of inferolateral wall, initial episode of care ([place] 03/25/2016  . Urine incontinence     Past Surgical History:  Procedure Laterality Date  . ABDOMINAL HYSTERECTOMY     ovaries not removed  . APPENDECTOMY     was removed during hysterectomy  . Breast biopsies     x2  . BREAST BIOPSY Bilateral "years ago"  . BREAST SURGERY Bilateral   . CARDIAC CATHETERIZATION  2011   moderate 40% RCA disease  . CARDIAC CATHETERIZATION  01/2010   Dr. Gollan@[facility] : Only noted 40% RCA  . CARDIAC CATHETERIZATION N/A 03/25/2016   Procedure: Left Heart Cath and Coronary Angiography;  Surgeon: D12/11/2015 MD;  [facility];  Service: Cardiovascular;  Laterality: N/A;  . CARDIAC CATHETERIZATION N/A  03/25/2016   Procedure: Coronary Stent Intervention;  Surgeon: Leonie Man, MD;  Location: [facility];  Service: Cardiovascular;  Laterality: N/A;  . COLONOSCOPY  2013  . ESOPHAGOGASTRODUODENOSCOPY (EGD) WITH PROPOFOL N/A 03/17/2015   Procedure: ESOPHAGOGASTRODUODENOSCOPY (EGD) WITH PROPOFOL;  Surgeon: Christene Lye, MD;  Location: [facility];  Service: Gastroenterology;  Laterality: N/A;  . NM MYOVIEW ([place] [facility])  07/2015   No evidence ischemia or infarction. EF 66%. Low risk  . TONSILLECTOMY    . TRANSTHORACIC ECHOCARDIOGRAM  10/2013   Normal LV size and function. EF 55-65%. GR 1 DD.  Otherwise normal.    Family History  Problem Relation Age of Onset  . Arthritis Mother   . Stroke Mother   . Hypertension Mother   . Osteoporosis Mother   . Heart disease Father        MI  . Heart attack Father   . Stroke Brother   . Heart attack Sister   . Breast cancer Other        first cousin x 2  . Stroke Daughter   . Heart attack Daughter   . Colon cancer Neg Hx     SOCIAL HX: reviewed.    Current Outpatient Medications:  .  acetaminophen (TYLENOL) 500 MG tablet, Take 500 mg by mouth daily as needed for headache (pain)., Disp: , Rfl:  .  albuterol (PROVENTIL HFA;VENTOLIN HFA) 108 (90 Base) MCG/ACT inhaler, Inhale 2 puffs into the lungs every 6 (six) hours as needed for wheezing or shortness of breath., Disp: 1 Inhaler, Rfl: 6 .  amiodarone (PACERONE) 200 MG tablet, TAKE 1 TABLET BY MOUTH DAILY, Disp: 90 tablet, Rfl: 0 .  aspirin EC 81 MG tablet, Take 1 tablet (81 mg total) by mouth daily., Disp: 90 tablet, Rfl: 3 .  azelastine (ASTELIN) 0.1 % nasal spray, Place 2 sprays into both nostrils daily as needed for rhinitis. Use in each nostril as directed, Disp: , Rfl:  .  BREO ELLIPTA 100-25 MCG/INH AEPB, INHALE 1 PUFF INTO THE LUNGS DAILY, Disp: 60 each, Rfl: 3 .  cholecalciferol (VITAMIN D) 1000 units tablet, Take 1,000 Units by mouth 2 (two) times daily., Disp: , Rfl:  .  ELIQUIS 5 MG TABS tablet, TAKE 1 TABLET BY MOUTH TWICE A DAY, Disp: 180 tablet, Rfl: 1 .  fish oil-omega-3 fatty acids 1000 MG capsule, Take 1 g by mouth 2 (two) times daily. , Disp: , Rfl:  .  furosemide (LASIX) 40 MG tablet, Take 1 tablet (40 mg) in the AM then take 0.5 tablet (20 mg) in the PM., Disp: 135 tablet, Rfl: 3 .  hydrALAZINE (APRESOLINE) 10 MG tablet, TAKE 1 TABLET BY MOUTH 3 TIMES A DAY, Disp: 270 tablet, Rfl: 0 .  losartan (COZAAR) 100 MG tablet, Take 1 tablet (100 mg total) by mouth daily., Disp: 90 tablet, Rfl: 0 .  Menthol, Topical Analgesic, (BIOFREEZE EX), Apply 1 application topically  daily as needed (pain)., Disp: , Rfl:  .  Misc Natural Products (OSTEO BI-FLEX JOINT SHIELD PO), Take 1 tablet by mouth 2 (two) times daily. , Disp: , Rfl:  .  Multiple Vitamin (MULTIVITAMIN WITH MINERALS) TABS tablet, Take 0.5 tablets by mouth 2 (two) times daily., Disp: , Rfl:  .  mupirocin ointment (BACTROBAN) 2 %, Place 1 application into the nose 2 (two) times daily. Use thin layer of ointment on skin., Disp: 22 g, Rfl: 0 .  nitroGLYCERIN (NITROSTAT) 0.4 MG SL tablet, Place 1 tablet (0.4 mg  total) under the tongue every 5 (five) minutes as needed., Disp: 25 tablet, Rfl: 0 .  pantoprazole (PROTONIX) 40 MG tablet, TAKE 1 TABLET BY MOUTH DAILY, Disp: 90 tablet, Rfl: 0 .  Polyethyl Glycol-Propyl Glycol (SYSTANE OP), Place 1 drop into both eyes daily as needed (dry eyes)., Disp: , Rfl:  .  potassium chloride (K-DUR) 10 MEQ tablet, Take 2 tablets (20 mEq total) by mouth daily., Disp: 180 tablet, Rfl: 0 .  PRESCRIPTION MEDICATION, Inhale into the lungs at bedtime. CPAP, Disp: , Rfl:  .  propranolol (INDERAL) 20 MG tablet, TAKE 1 TABLET BY MOUTH 3 TIMES DAILY, Disp: 270 tablet, Rfl: 0 .  rosuvastatin (CRESTOR) 40 MG tablet, TAKE 1 TABLET BY MOUTH DAILY, Disp: 90 tablet, Rfl: 0 .  traMADol (ULTRAM) 50 MG tablet, Take 50 mg by mouth every 8 (eight) hours as needed., Disp: , Rfl:  .  triamcinolone cream (KENALOG) 0.1 %, Apply 1 application topically 2 (two) times daily., Disp: 30 g, Rfl: 0 .  trimethoprim-polymyxin b (POLYTRIM) ophthalmic solution, Place 1 drop into the left eye every 4 (four) hours. Do for 7 days, Disp: 10 mL, Rfl: 0  EXAM:  GENERAL: alert. Appears to be in no acute distress.  Answering questions appropriately.    PSYCH/NEURO: pleasant and cooperative, no obvious depression or anxiety, speech and thought processing grossly intact  ASSESSMENT AND PLAN:  Discussed the following assessment and plan:  CAD S/P PCI pRCA Promus DES 3.5 x 16 (4.1 mm), ostRPDA Promus DES 2.5 x 12 (2.7  mm)  CKD (chronic kidney disease), stage III (HCC)  Type 2 diabetes mellitus with other circulatory complication, without long-term current use of insulin (HCC)  Essential hypertension, benign  Gastroesophageal reflux disease without esophagitis  History of colonic polyps  Hypercholesterolemia  Lower extremity edema  Obstructive sleep apnea  Paroxysmal atrial fibrillation (HCC)  Stress  CAD S/P PCI pRCA Promus DES 3.5 x 16 (4.1 mm), ostRPDA Promus DES 2.5 x 12 (2.7 mm) Stable.  Followed by cardiology.  Continue risk factor modification.    CKD (chronic kidney disease), stage III ([place]) Met b not performed with recent labs.  See if can get drawn at [facility].  Avoid antiinflammatories.  Follow kidney function.   Diabetes mellitus ([place]) Low carb diet and exercise.  Follow met b and a1c.  Recent check - a1c 6.2.    Essential hypertension, benign Blood pressure has been under reasonable control.  Have her spot check her pressure if possible.  Follow metabolic panel.  Same medication.   GERD (gastroesophageal reflux disease) Controlled.    History of colonic polyps Had colonoscopy 06/23/11 - diverticulosis otherwise normal.  Recommended f/u colonoscopy in 5 years.  Saw Dr Bary Castilla.  cologuard negative.  No further w/up felt warranted at this time.    Hypercholesterolemia On crestor.  Low cholesterol diet and exercise.  Follow lipid panel and liver function tests.   Lab Results  Component Value Date   CHOL 165 08/20/2018   HDL 69.60 08/20/2018   LDLCALC 80 08/20/2018   TRIG 78.0 08/20/2018   CHOLHDL 2 08/20/2018    Lower extremity edema Saw [facility].  Reports swelling is better.  Follow.    Obstructive sleep apnea Continue cpap.   Paroxysmal atrial fibrillation (HCC) Rate controled. On eliquis.  Followed by cardiology.  Stable.   Stress Overall she feels she is handling things relatively well.  Follow.      I discussed the assessment and treatment plan  with  the patient. The patient was provided an opportunity to ask questions and all were answered. The patient agreed with the plan and demonstrated an understanding of the instructions.   The patient was advised to call back or seek an in-person evaluation if the symptoms worsen or if the condition fails to improve as anticipated.  I provided 20 minutes of non-face-to-face time during this encounter.   Einar Pheasant, MD

## 2018-08-25 ENCOUNTER — Encounter: Payer: Self-pay | Admitting: Internal Medicine

## 2018-08-25 NOTE — Assessment & Plan Note (Signed)
Controlled.  

## 2018-08-25 NOTE — Assessment & Plan Note (Signed)
Rate controled. On eliquis.  Followed by cardiology.  Stable.

## 2018-08-25 NOTE — Assessment & Plan Note (Signed)
Met b not performed with recent labs.  See if can get drawn at [facility].  Avoid antiinflammatories.  Follow kidney function.

## 2018-08-25 NOTE — Assessment & Plan Note (Signed)
On crestor.  Low cholesterol diet and exercise.  Follow lipid panel and liver function tests.   Lab Results  Component Value Date   CHOL 165 08/20/2018   HDL 69.60 08/20/2018   LDLCALC 80 08/20/2018   TRIG 78.0 08/20/2018   CHOLHDL 2 08/20/2018

## 2018-08-25 NOTE — Assessment & Plan Note (Signed)
Stable.  Followed by cardiology.  Continue risk factor modification.

## 2018-08-25 NOTE — Assessment & Plan Note (Signed)
Had colonoscopy 06/23/11 - diverticulosis otherwise normal.  Recommended f/u colonoscopy in 5 years.  Saw Dr Bary Castilla.  cologuard negative.  No further w/up felt warranted at this time.

## 2018-08-25 NOTE — Assessment & Plan Note (Signed)
Overall she feels she is handling things relatively well.  Follow.  

## 2018-08-25 NOTE — Assessment & Plan Note (Signed)
Blood pressure has been under reasonable control.  Have her spot check her pressure if possible.  Follow metabolic panel.  Same medication.

## 2018-08-25 NOTE — Assessment & Plan Note (Signed)
Saw [facility].  Reports swelling is better.  Follow.

## 2018-08-25 NOTE — Assessment & Plan Note (Signed)
Continue cpap.  

## 2018-08-25 NOTE — Assessment & Plan Note (Signed)
Low carb diet and exercise.  Follow met b and a1c.  Recent check - a1c 6.2.

## 2018-08-26 ENCOUNTER — Other Ambulatory Visit: Payer: Self-pay | Admitting: Internal Medicine

## 2018-08-26 ENCOUNTER — Telehealth: Payer: Self-pay | Admitting: *Deleted

## 2018-08-26 DIAGNOSIS — Z1231 Encounter for screening mammogram for malignant neoplasm of breast: Secondary | ICD-10-CM

## 2018-08-26 NOTE — Telephone Encounter (Signed)
Copied from [place] [id number]. Topic: General - Other >> Aug 26, 2018  2:50 PM Rayann Heman wrote: Reason for CRM: pt called and stated that she needs an order for blood work sent to [facility] at [place]. They are able to do blood work. Please advise Fax# [phone] Phone#[phone]

## 2018-08-27 NOTE — Telephone Encounter (Signed)
faxed

## 2018-08-27 NOTE — Telephone Encounter (Signed)
Signed and placed in box.   

## 2018-08-27 NOTE — Telephone Encounter (Signed)
rx for bmp placed out for your signature.

## 2018-08-29 ENCOUNTER — Telehealth: Payer: Self-pay | Admitting: Internal Medicine

## 2018-08-29 DIAGNOSIS — N183 Chronic kidney disease, stage 3 unspecified: Secondary | ICD-10-CM

## 2018-08-29 NOTE — Telephone Encounter (Signed)
Pt was wondering if Dr. Nicki Reaper would put orders in to have kidney function and potassium labs. Pt wants to have them done at [facility] at [place].

## 2018-08-29 NOTE — Telephone Encounter (Signed)
Called patient to let her know that I have faxed orders to [facility]. She is going to let me know if they have not received

## 2018-08-29 NOTE — Telephone Encounter (Signed)
Pt called April [name] back and said she has talked to [facility] and they informed her that if she was not set up at [place], [name] at [place], said it would be simpler to have them done in the lab at [facility] right now it is too much of a process. Could you call her back and make a lab appt for her this time?

## 2018-08-30 NOTE — Telephone Encounter (Signed)
I spoke with this patient when she called back, she is scheduled for her labs next week. She requested to have them done here.

## 2018-08-30 NOTE — Telephone Encounter (Signed)
April [name], I called this pt to schedule her labs but I had to leave a message, just a FYI she stated that she talked to [facility] at [place] and they stated she was not set up at [facility] and it would be easier for her to do lab work here because it is too much a process. If and when she calls back I will schedule her for labs.  Nina,cma

## 2018-09-05 ENCOUNTER — Other Ambulatory Visit (INDEPENDENT_AMBULATORY_CARE_PROVIDER_SITE_OTHER): Payer: Medicare Other

## 2018-09-05 ENCOUNTER — Other Ambulatory Visit: Payer: Self-pay

## 2018-09-05 DIAGNOSIS — N183 Chronic kidney disease, stage 3 unspecified: Secondary | ICD-10-CM

## 2018-09-05 LAB — BASIC METABOLIC PANEL
BUN: 23 mg/dL (ref 6–23)
CO2: 29 mEq/L (ref 19–32)
Calcium: 9 mg/dL (ref 8.4–10.5)
Chloride: 105 mEq/L (ref 96–112)
Creatinine, Ser: 1.16 mg/dL (ref 0.40–1.20)
GFR: 44.97 mL/min — ABNORMAL LOW (ref >60.00)
Glucose, Bld: 151 mg/dL — ABNORMAL HIGH (ref 70–99)
Potassium: 4.3 mEq/L (ref 3.5–5.1)
Sodium: 141 mEq/L (ref 135–145)

## 2018-09-24 ENCOUNTER — Other Ambulatory Visit: Payer: Self-pay | Admitting: Cardiovascular Disease

## 2018-10-29 ENCOUNTER — Other Ambulatory Visit: Payer: Self-pay | Admitting: Cardiovascular Disease

## 2018-10-29 ENCOUNTER — Telehealth: Payer: Self-pay | Admitting: Cardiovascular Disease

## 2018-10-29 MED ORDER — POTASSIUM CHLORIDE ER 10 MEQ PO TBCR: 20.0000 meq | EXTENDED_RELEASE_TABLET | Freq: Every day | ORAL | 0 refills | Status: DC

## 2018-10-29 MED ORDER — PROPRANOLOL HCL 20 MG PO TABS: 20.0000 mg | ORAL_TABLET | Freq: Three times a day (TID) | ORAL | 0 refills | Status: DC

## 2018-10-29 MED ORDER — HYDRALAZINE HCL 10 MG PO TABS: 10.0000 mg | ORAL_TABLET | Freq: Three times a day (TID) | ORAL | 0 refills | Status: DC

## 2018-10-29 MED ORDER — PANTOPRAZOLE SODIUM 40 MG PO TBEC: DELAYED_RELEASE_TABLET | ORAL | 0 refills | Status: DC

## 2018-10-29 MED ORDER — FUROSEMIDE 40 MG PO TABS: ORAL_TABLET | ORAL | 3 refills | Status: DC

## 2018-10-29 MED ORDER — ROSUVASTATIN CALCIUM 40 MG PO TABS: ORAL_TABLET | ORAL | 0 refills | Status: DC

## 2018-10-29 MED ORDER — AMIODARONE HCL 200 MG PO TABS: ORAL_TABLET | ORAL | 0 refills | Status: DC

## 2018-10-29 MED ORDER — LOSARTAN POTASSIUM 100 MG PO TABS: 100.0000 mg | ORAL_TABLET | Freq: Every day | ORAL | 0 refills | Status: DC

## 2018-10-29 NOTE — Telephone Encounter (Signed)
  °*  STAT* If patient is at the pharmacy, call can be transferred to refill team.   1. Which medications need to be refilled? (please list name of each medication and dose if known)  Amiodarone 200 mg once daily Breo Ellipta 100-25 MCG/INH 1 puff daily Eliquis 5mg  tablet 2 daily Lasix 40mg   Hydraalazine 10mg  1 tablet 3xday Potassium chloirde 10 meq 2 tablets daily Propanonolol 20mg  1 tablet 3x day Crestor 40 mg once daily    2. Which pharmacy/location (including street and city if local pharmacy) is medication to be sent to? [facility]   3. Do they need a 30 day or 90 day supply? 90 day

## 2018-10-29 NOTE — Telephone Encounter (Signed)
°*  STAT* If patient is at the pharmacy, call can be transferred to refill team.   1. Which medications need to be refilled? (please list name of each medication and dose if known)  Amiodarone 200 mg once daily Breo Ellipta 100-25 MCG/INH 1 puff daily Eliquis 5mg  tablet 2 daily Lasix 40mg   Hydraalazine 10mg  1 tablet 3xday Potassium chloirde 10 meq 2 tablets daily Propanonolol 20mg  1 tablet 3x day Crestor 40 mg once daily    2. Which pharmacy/location (including street and city if local pharmacy) is medication to be sent to? [facility]   3. Do they need a 30 day or 90 day supply? 90 day

## 2018-10-30 ENCOUNTER — Other Ambulatory Visit: Payer: Self-pay

## 2018-10-30 ENCOUNTER — Ambulatory Visit
Admission: RE | Admit: 2018-10-30 | Discharge: 2018-10-30 | Disposition: A | Discharge status: Home / Self Care | Payer: Medicare Other | Source: Ambulatory Visit | Attending: Internal Medicine | Admitting: Internal Medicine

## 2018-10-30 DIAGNOSIS — Z1231 Encounter for screening mammogram for malignant neoplasm of breast: Secondary | ICD-10-CM | POA: Diagnosis not present

## 2018-11-22 ENCOUNTER — Encounter (INDEPENDENT_AMBULATORY_CARE_PROVIDER_SITE_OTHER): Payer: Self-pay | Admitting: Vascular Surgery

## 2018-11-22 ENCOUNTER — Other Ambulatory Visit: Payer: Self-pay

## 2018-11-22 ENCOUNTER — Ambulatory Visit (INDEPENDENT_AMBULATORY_CARE_PROVIDER_SITE_OTHER): Payer: Medicare Other | Admitting: Vascular Surgery

## 2018-11-22 VITALS — BP 153/79 | HR 60 | Resp 12 | Ht 60.0 in | Wt 194.0 lb

## 2018-11-22 DIAGNOSIS — I2510 Atherosclerotic heart disease of native coronary artery without angina pectoris: Secondary | ICD-10-CM | POA: Diagnosis not present

## 2018-11-22 DIAGNOSIS — I480 Paroxysmal atrial fibrillation: Secondary | ICD-10-CM | POA: Diagnosis not present

## 2018-11-22 DIAGNOSIS — Z79899 Other long term (current) drug therapy: Secondary | ICD-10-CM

## 2018-11-22 DIAGNOSIS — E7800 Pure hypercholesterolemia, unspecified: Secondary | ICD-10-CM | POA: Diagnosis not present

## 2018-11-22 DIAGNOSIS — E1159 Type 2 diabetes mellitus with other circulatory complications: Secondary | ICD-10-CM | POA: Diagnosis not present

## 2018-11-22 DIAGNOSIS — Z9861 Coronary angioplasty status: Secondary | ICD-10-CM | POA: Diagnosis not present

## 2018-11-22 DIAGNOSIS — I890 Lymphedema, not elsewhere classified: Secondary | ICD-10-CM | POA: Diagnosis not present

## 2018-11-22 DIAGNOSIS — I10 Essential (primary) hypertension: Secondary | ICD-10-CM

## 2018-11-22 NOTE — Progress Notes (Signed)
MRN : [id number]  April [name] is a 78 y.o. (11-10-1940) female who presents with chief complaint of  Chief Complaint  Patient presents with  . Follow-up  .  History of Present Illness: Patient returns today in follow up of her lymphedema.  She has been using the lymphedema pump diligently since her last visit 6 months ago and has done quite well with this.  No new ulceration or infection.  Leg swelling is significantly improved in general although she says they are a little more swollen today than most days.  Left leg remains a little more swollen than the right leg.  Current Outpatient Medications  Medication Sig Dispense Refill  . acetaminophen (TYLENOL) 500 MG tablet Take 500 mg by mouth daily as needed for headache (pain).    Marland Kitchen albuterol (PROVENTIL HFA;VENTOLIN HFA) 108 (90 Base) MCG/ACT inhaler Inhale 2 puffs into the lungs every 6 (six) hours as needed for wheezing or shortness of breath. 1 Inhaler 6  . amiodarone (PACERONE) 200 MG tablet TAKE 1 TABLET BY MOUTH DAILY 90 tablet 0  . aspirin EC 81 MG tablet Take 1 tablet (81 mg total) by mouth daily. 90 tablet 3  . azelastine (ASTELIN) 0.1 % nasal spray Place 2 sprays into both nostrils daily as needed for rhinitis. Use in each nostril as directed    . BREO ELLIPTA 100-25 MCG/INH AEPB INHALE 1 PUFF INTO THE LUNGS DAILY 60 each 3  . cholecalciferol (VITAMIN D) 1000 units tablet Take 1,000 Units by mouth 2 (two) times daily.    Marland Kitchen ELIQUIS 5 MG TABS tablet TAKE 1 TABLET BY MOUTH TWICE A DAY 180 tablet 1  . fish oil-omega-3 fatty acids 1000 MG capsule Take 1 g by mouth 2 (two) times daily.     . furosemide (LASIX) 40 MG tablet Take 1 tablet (40 mg) in the AM then take 0.5 tablet (20 mg) in the PM. 135 tablet 3  . hydrALAZINE (APRESOLINE) 10 MG tablet Take 1 tablet (10 mg total) by mouth 3 (three) times daily. 270 tablet 0  . losartan (COZAAR) 100 MG tablet Take 1 tablet (100 mg total) by mouth daily. 90 tablet 0  . Menthol, Topical  Analgesic, (BIOFREEZE EX) Apply 1 application topically daily as needed (pain).    . Misc Natural Products (OSTEO BI-FLEX JOINT SHIELD PO) Take 1 tablet by mouth 2 (two) times daily.     . Multiple Vitamin (MULTIVITAMIN WITH MINERALS) TABS tablet Take 0.5 tablets by mouth 2 (two) times daily.    . mupirocin ointment (BACTROBAN) 2 % Place 1 application into the nose 2 (two) times daily. Use thin layer of ointment on skin. 22 g 0  . nitroGLYCERIN (NITROSTAT) 0.4 MG SL tablet Place 1 tablet (0.4 mg total) under the tongue every 5 (five) minutes as needed. 25 tablet 0  . pantoprazole (PROTONIX) 40 MG tablet TAKE 1 TABLET BY MOUTH DAILY 90 tablet 0  . Polyethyl Glycol-Propyl Glycol (SYSTANE OP) Place 1 drop into both eyes daily as needed (dry eyes).    . potassium chloride (K-DUR) 10 MEQ tablet Take 2 tablets (20 mEq total) by mouth daily. 180 tablet 0  . propranolol (INDERAL) 20 MG tablet Take 1 tablet (20 mg total) by mouth 3 (three) times daily. 270 tablet 0  . rosuvastatin (CRESTOR) 40 MG tablet TAKE 1 TABLET BY MOUTH DAILY 90 tablet 0  . traMADol (ULTRAM) 50 MG tablet Take 50 mg by mouth every 8 (eight) hours as needed.    Marland Kitchen  triamcinolone cream (KENALOG) 0.1 % Apply 1 application topically 2 (two) times daily. 30 g 0  . PRESCRIPTION MEDICATION Inhale into the lungs at bedtime. CPAP    . trimethoprim-polymyxin b (POLYTRIM) ophthalmic solution Place 1 drop into the left eye every 4 (four) hours. Do for 7 days (Patient not taking: Reported on 11/22/2018) 10 mL 0   No current facility-administered medications for this visit.     Past Medical History:  Diagnosis Date  . CAD S/P percutaneous coronary angioplasty    a. 07/2015 MV: No ischemia, EF 66%;  b. 03/2016 Inflat STEMI/PCI: LM nl, LAD 25d, RI nl, LCX nl, OM1/2 nl, RCA 80p (3.5x16 Promus Premier DES), 50p/m, 30d, RPDA 90 (2.5x12 Promu Premier DES); c. 01/2018 Cath ([place], [place]): Patent RCA stents->Med Rx.  . CKD (chronic kidney  disease), stage III ([place])   . Diastolic dysfunction    a. 10/2013 Echo: EF 55-65%, Gr1 DD; b. 01/2018 Echo ([place], [place]): EF 55-60%, Gr2 DD, RVSP 32mmHg.  . Diverticulitis   . Endometriosis   . Hiatal hernia   . History of colon polyps 08/1994  . History of Migraines   . Hypercholesterolemia   . Hypertension   . LBBB (left bundle branch block)   . Osteoporosis   . PAF (paroxysmal atrial fibrillation) ([place])    a. 03/2016 Dx @ time of MI; b. CHA2DS2VASc = 5-->Eliquis.  . Rheumatic fever   . Sleep apnea    CPAP  . ST elevation myocardial infarction (STEMI) of inferolateral wall, initial episode of care ([place]) 03/25/2016  . Urine incontinence     Past Surgical History:  Procedure Laterality Date  . ABDOMINAL HYSTERECTOMY     ovaries not removed  . APPENDECTOMY     was removed during hysterectomy  . Breast biopsies     x2  . BREAST BIOPSY Bilateral "years ago"   neg  . BREAST SURGERY Bilateral   . CARDIAC CATHETERIZATION  2011   moderate 40% RCA disease  . CARDIAC CATHETERIZATION  01/2010   Dr. Gollan@[facility] : Only noted 40% RCA  . CARDIAC CATHETERIZATION N/A 03/25/2016   Procedure: Left Heart Cath and Coronary Angiography;  Surgeon: 04/07/2016, MD;  Location: [facility];  Service: Cardiovascular;  Laterality: N/A;  . CARDIAC CATHETERIZATION N/A 03/25/2016   Procedure: Coronary Stent Intervention;  Surgeon: 04/07/2016, MD;  Location: [facility];  Service: Cardiovascular;  Laterality: N/A;  . COLONOSCOPY  2013  . ESOPHAGOGASTRODUODENOSCOPY (EGD) WITH PROPOFOL N/A 03/17/2015   Procedure: ESOPHAGOGASTRODUODENOSCOPY (EGD) WITH PROPOFOL;  Surgeon: 03/30/2015, MD;  Location: [facility];  Service: Gastroenterology;  Laterality: N/A;  . NM MYOVIEW ([place] [facility]X)  07/2015   No evidence ischemia or infarction. EF 66%. Low risk  . TONSILLECTOMY    . TRANSTHORACIC ECHOCARDIOGRAM  10/2013   Normal LV size and function. EF 55-65%. GR 1 DD.  Otherwise normal.    Social History Social History   Tobacco Use  . Smoking status: Never Smoker  . Smokeless tobacco: Never Used  Substance Use Topics  . Alcohol use: No    Alcohol/week: 0.0 standard drinks  . Drug use: No    Family History Family History  Problem Relation Age of Onset  . Arthritis Mother   . Stroke Mother   . Hypertension Mother   . Osteoporosis Mother   . Heart disease Father        MI  . Heart attack Father   .  Stroke Brother   . Heart attack Sister   . Breast cancer Other        first cousin x 2  . Stroke Daughter   . Heart attack Daughter   . Colon cancer Neg Hx     Allergies  Allergen Reactions  . Penicillins Other (See Comments)    Okay to take amoxicillin/(pt does not recall what the reaction to penicillin was (13-80 years old)   . Sulfa Antibiotics Rash   REVIEW OF SYSTEMS (Negative unless checked)  Constitutional: [] ?Weight loss  [] ?Fever  [] ?Chills Cardiac: [] ?Chest pain   [] ?Chest pressure   [x] ?Palpitations   [] ?Shortness of breath when laying flat   [] ?Shortness of breath at rest   [x] ?Shortness of breath with exertion. Vascular:  [x] ?Pain in legs with walking   [] ?Pain in legs at rest   [] ?Pain in legs when laying flat   [] ?Claudication   [] ?Pain in feet when walking  [] ?Pain in feet at rest  [] ?Pain in feet when laying flat   [] ?History of DVT   [] ?Phlebitis   [x] ?Swelling in legs   [x] ?Varicose veins   [] ?Non-healing ulcers Pulmonary:   [] ?Uses home oxygen   [] ?Productive cough   [] ?Hemoptysis   [] ?Wheeze  [] ?COPD   [] ?Asthma Neurologic:  [] ?Dizziness  [] ?Blackouts   [] ?Seizures   [] ?History of stroke   [] ?History of TIA  [] ?Aphasia   [] ?Temporary blindness   [] ?Dysphagia   [] ?Weakness or numbness in arms   [] ?Weakness or numbness in legs Musculoskeletal:  [x] ?Arthritis   [] ?Joint swelling   [x] ?Joint pain   [] ?Low back pain Hematologic:  [] ?Easy bruising  [] ?Easy bleeding   [] ?Hypercoagulable state   [] ?Anemic  [] ?Hepatitis  Gastrointestinal:  [] ?Blood in stool   [] ?Vomiting blood  [x] ?Gastroesophageal reflux/heartburn   [] ?Abdominal pain Genitourinary:  [] ?Chronic kidney disease   [] ?Difficult urination  [] ?Frequent urination  [] ?Burning with urination   [] ?Hematuria Skin:  [] ?Rashes   [] ?Ulcers   [] ?Wounds Psychological:  [] ?History of anxiety   [] ? History of major depression.    Physical Examination  BP (!) 153/79 (BP Location: Left Arm, Patient Position: Sitting, Cuff Size: Large)   Pulse 60   Resp 12   Ht 5' (1.524 m)   Wt 194 lb (88 kg)   LMP 05/01/1972   BMI 37.89 kg/m  Gen:  WD/WN, NAD.  Appears younger than stated age Head: [place]/AT, No temporalis wasting. Ear/Nose/Throat: Hearing grossly intact, nares w/o erythema or drainage Eyes: Conjunctiva clear. Sclera non-icteric Neck: Supple.  Trachea midline Pulmonary:  Good air movement, no use of accessory muscles.  Cardiac: RRR, no JVD Vascular:  Vessel Right Left  Radial Palpable Palpable                          PT 1+ Palpable 1+ Palpable  DP Palpable Palpable   Musculoskeletal: M/S 5/5 throughout.  No deformity or atrophy.  1+ right lower extremity edema, 1-2+ left lower extremity edema edema. Neurologic: Sensation grossly intact in extremities.  Symmetrical.  Speech is fluent.  Psychiatric: Judgment intact, Mood & affect appropriate for pt's clinical situation. Dermatologic: No rashes or ulcers noted.  No cellulitis or open wounds.       Labs Recent Results (from the past 2160 hour(s))  Basic Metabolic Panel (BMET)     Status: Abnormal   Collection Time: 09/05/18 11:18 AM  Result Value Ref Range   Sodium 141 135 - 145 mEq/L   Potassium 4.3 3.5 - 5.1 mEq/L  Chloride 105 96 - 112 mEq/L   CO2 29 19 - 32 mEq/L   Glucose, Bld 151 (H) 70 - 99 mg/dL   BUN 23 6 - 23 mg/dL   Creatinine, Ser 1.16 0.40 - 1.20 mg/dL   Calcium 9.0 8.4 - 10.5 mg/dL   GFR 44.97 (L) >60.00 mL/min    Radiology Mm 3d Screen Breast Bilateral  Result  Date: 10/30/2018 CLINICAL DATA:  Screening. EXAM: DIGITAL SCREENING BILATERAL MAMMOGRAM WITH TOMO AND CAD COMPARISON:  Previous exam(s). ACR Breast Density Category c: The breast tissue is heterogeneously dense, which may obscure small masses. FINDINGS: There are no findings suspicious for malignancy. Images were processed with CAD. IMPRESSION: No mammographic evidence of malignancy. A result letter of this screening mammogram will be mailed directly to the patient. RECOMMENDATION: Screening mammogram in one year. (Code:SM-B-01Y) BI-RADS CATEGORY  1: Negative. Electronically Signed   By: Margarette Canada M.D.   On: 10/30/2018 16:32    Assessment/Plan Paroxysmal atrial fibrillation (HCC) On anticoagulation and rate controlled. Cardiac issues can worsen lower extremity edema.  Essential hypertension, benign blood pressure control important in reducing the progression of atherosclerotic disease. On appropriate oral medications.   Diabetes mellitus ([place]) blood glucose control important in reducing the progression of atherosclerotic disease. Also, involved in wound healing. On appropriate medications.   Hypercholesterolemia lipid control important in reducing the progression of atherosclerotic disease. Continue statin therapy  Lymphedema Symptoms are reasonably well controlled with compression and the lymphedema pump.  Patient is pleased with the pump.  No change in her plan of care at this time.  Reassess in 1 year    Leotis Pain, MD  11/22/2018 12:17 PM    This note was created with Dragon medical transcription system.  Any errors from dictation are purely unintentional

## 2018-11-22 NOTE — Assessment & Plan Note (Signed)
Symptoms are reasonably well controlled with compression and the lymphedema pump.  Patient is pleased with the pump.  No change in her plan of care at this time.  Reassess in 1 year

## 2018-11-22 NOTE — Patient Instructions (Signed)

## 2018-12-11 ENCOUNTER — Other Ambulatory Visit: Payer: Self-pay

## 2018-12-11 ENCOUNTER — Ambulatory Visit (INDEPENDENT_AMBULATORY_CARE_PROVIDER_SITE_OTHER): Payer: Medicare Other | Admitting: Internal Medicine

## 2018-12-11 ENCOUNTER — Encounter: Payer: Self-pay | Admitting: Internal Medicine

## 2018-12-11 DIAGNOSIS — E7800 Pure hypercholesterolemia, unspecified: Secondary | ICD-10-CM

## 2018-12-11 DIAGNOSIS — G4733 Obstructive sleep apnea (adult) (pediatric): Secondary | ICD-10-CM | POA: Diagnosis not present

## 2018-12-11 DIAGNOSIS — N183 Chronic kidney disease, stage 3 unspecified: Secondary | ICD-10-CM

## 2018-12-11 DIAGNOSIS — K219 Gastro-esophageal reflux disease without esophagitis: Secondary | ICD-10-CM

## 2018-12-11 DIAGNOSIS — F439 Reaction to severe stress, unspecified: Secondary | ICD-10-CM

## 2018-12-11 DIAGNOSIS — N644 Mastodynia: Secondary | ICD-10-CM | POA: Diagnosis not present

## 2018-12-11 DIAGNOSIS — I480 Paroxysmal atrial fibrillation: Secondary | ICD-10-CM

## 2018-12-11 DIAGNOSIS — I2510 Atherosclerotic heart disease of native coronary artery without angina pectoris: Secondary | ICD-10-CM | POA: Diagnosis not present

## 2018-12-11 DIAGNOSIS — Z9861 Coronary angioplasty status: Secondary | ICD-10-CM

## 2018-12-11 DIAGNOSIS — I10 Essential (primary) hypertension: Secondary | ICD-10-CM | POA: Diagnosis not present

## 2018-12-11 DIAGNOSIS — R600 Localized edema: Secondary | ICD-10-CM | POA: Diagnosis not present

## 2018-12-11 DIAGNOSIS — E1159 Type 2 diabetes mellitus with other circulatory complications: Secondary | ICD-10-CM | POA: Diagnosis not present

## 2018-12-11 NOTE — Progress Notes (Signed)
Patient ID: April [name], female   DOB: [birth year], 78 y.o.   MRN: [id number]   Virtual Visit via telephone Note  This visit type was conducted due to national recommendations for restrictions regarding the COVID-19 pandemic (e.g. social distancing).  This format is felt to be most appropriate for this patient at this time.  All issues noted in this document were discussed and addressed.  No physical exam was performed (except for noted visual exam findings with Video Visits).   I connected with April [name] by telephone and verified that I am speaking with the correct person using two identifiers. Location patient: home Location provider: work Persons participating in the telephone visit: patient, provider  I discussed the limitations, risks, security and privacy concerns of performing an evaluation and management service by telephone and the availability of in person appointments. The patient expressed understanding and agreed to proceed.   Reason for visit:  Scheduled follow up.  HPI: She reports she is doing relatively well.  Followed by [facility] for lower extremity swelling.  Uses lymphedema pump.  Swelling is better.  Sees Dr Rockey Situ for f/u afib and CAD.  Stable.  Recommended f/u in one year.  No chest pain.  No increased heart rate or palpitations.  Breathing stable.  No acid reflux.  No abdominal pain.  Bowels moving.  Discussed diet and exercise.  She does report breast tenderness 3:00 .  Just had mammogram 10/30/18 - Birads I.  Discussed further evaluation.  She declines.  Wants to monitor.  Increased back pain if stands in one place.  Discussed stretches and increased activity.  Desires no further intervention.     ROS: See pertinent positives and negatives per HPI.  Past Medical History:  Diagnosis Date   CAD S/P percutaneous coronary angioplasty    a. 07/2015 MV: No ischemia, EF 66%;  b. 03/2016 Inflat STEMI/PCI: LM nl, LAD 25d, RI nl, LCX nl, OM1/2 nl, RCA 80p (3.5x16 Promus  Premier DES), 50p/m, 30d, RPDA 90 (2.5x12 Promu Premier DES); c. 01/2018 Cath ([place], [place]): Patent RCA stents->Med Rx.   CKD (chronic kidney disease), stage III ([place])    Diastolic dysfunction    a. 10/2013 Echo: EF 55-65%, Gr1 DD; b. 01/2018 Echo ([place], [place]): EF 55-60%, Gr2 DD, RVSP 22mHg.   Diverticulitis    Endometriosis    Hiatal hernia    History of colon polyps 08/1994   History of Migraines    Hypercholesterolemia    Hypertension    LBBB (left bundle branch block)    Osteoporosis    PAF (paroxysmal atrial fibrillation) ([place]    a. 03/2016 Dx @ time of MI; b. CHA2DS2VASc = 5-->Eliquis.   Rheumatic fever    Sleep apnea    CPAP   ST elevation myocardial infarction (STEMI) of inferolateral wall, initial episode of care ([place] 03/25/2016   Urine incontinence     Past Surgical History:  Procedure Laterality Date   ABDOMINAL HYSTERECTOMY     ovaries not removed   APPENDECTOMY     was removed during hysterectomy   Breast biopsies     x2   BREAST BIOPSY Bilateral "years ago"   neg   BREAST SURGERY Bilateral    CARDIAC CATHETERIZATION  2011   moderate 40% RCA disease   CARDIAC CATHETERIZATION  01/2010   Dr. Gollan@[facility] : Only noted 40% RCA   CARDIAC CATHETERIZATION N/A 03/25/2016   Procedure: Left Heart Cath and Coronary Angiography;  Surgeon: D12/11/2015  Ellyn Hack, MD;  Location: [facility];  Service: Cardiovascular;  Laterality: N/A;   CARDIAC CATHETERIZATION N/A 03/25/2016   Procedure: Coronary Stent Intervention;  Surgeon: Leonie Man, MD;  Location: [facility];  Service: Cardiovascular;  Laterality: N/A;   COLONOSCOPY  2013   ESOPHAGOGASTRODUODENOSCOPY (EGD) WITH PROPOFOL N/A 03/17/2015   Procedure: ESOPHAGOGASTRODUODENOSCOPY (EGD) WITH PROPOFOL;  Surgeon: Christene Lye, MD;  Location: [facility];  Service: Gastroenterology;  Laterality: N/A;   NM MYOVIEW ([place] [facility]X)  07/2015   No  evidence ischemia or infarction. EF 66%. Low risk   TONSILLECTOMY     TRANSTHORACIC ECHOCARDIOGRAM  10/2013   Normal LV size and function. EF 55-65%. GR 1 DD. Otherwise normal.    Family History  Problem Relation Age of Onset   Arthritis Mother    Stroke Mother    Hypertension Mother    Osteoporosis Mother    Heart disease Father        MI   Heart attack Father    Stroke Brother    Heart attack Sister    Breast cancer Other        first cousin x 2   Stroke Daughter    Heart attack Daughter    Colon cancer Neg Hx     SOCIAL HX: reviewed.    Current Outpatient Medications:    acetaminophen (TYLENOL) 500 MG tablet, Take 500 mg by mouth daily as needed for headache (pain)., Disp: , Rfl:    albuterol (PROVENTIL HFA;VENTOLIN HFA) 108 (90 Base) MCG/ACT inhaler, Inhale 2 puffs into the lungs every 6 (six) hours as needed for wheezing or shortness of breath., Disp: 1 Inhaler, Rfl: 6   amiodarone (PACERONE) 200 MG tablet, TAKE 1 TABLET BY MOUTH DAILY, Disp: 90 tablet, Rfl: 0   aspirin EC 81 MG tablet, Take 1 tablet (81 mg total) by mouth daily., Disp: 90 tablet, Rfl: 3   azelastine (ASTELIN) 0.1 % nasal spray, Place 2 sprays into both nostrils daily as needed for rhinitis. Use in each nostril as directed, Disp: , Rfl:    BREO ELLIPTA 100-25 MCG/INH AEPB, INHALE 1 PUFF INTO THE LUNGS DAILY, Disp: 60 each, Rfl: 3   cholecalciferol (VITAMIN D) 1000 units tablet, Take 1,000 Units by mouth 2 (two) times daily., Disp: , Rfl:    ELIQUIS 5 MG TABS tablet, TAKE 1 TABLET BY MOUTH TWICE A DAY, Disp: 180 tablet, Rfl: 1   fish oil-omega-3 fatty acids 1000 MG capsule, Take 1 g by mouth 2 (two) times daily. , Disp: , Rfl:    furosemide (LASIX) 40 MG tablet, Take 1 tablet (40 mg) in the AM then take 0.5 tablet (20 mg) in the PM., Disp: 135 tablet, Rfl: 3   hydrALAZINE (APRESOLINE) 10 MG tablet, Take 1 tablet (10 mg total) by mouth 3 (three) times daily., Disp: 270 tablet, Rfl:  0   losartan (COZAAR) 100 MG tablet, Take 1 tablet (100 mg total) by mouth daily., Disp: 90 tablet, Rfl: 0   Menthol, Topical Analgesic, (BIOFREEZE EX), Apply 1 application topically daily as needed (pain)., Disp: , Rfl:    Misc Natural Products (OSTEO BI-FLEX JOINT SHIELD PO), Take 1 tablet by mouth 2 (two) times daily. , Disp: , Rfl:    Multiple Vitamin (MULTIVITAMIN WITH MINERALS) TABS tablet, Take 0.5 tablets by mouth 2 (two) times daily., Disp: , Rfl:    mupirocin ointment (BACTROBAN) 2 %, Place 1 application into the nose 2 (two) times daily. Use thin layer  of ointment on skin., Disp: 22 g, Rfl: 0   nitroGLYCERIN (NITROSTAT) 0.4 MG SL tablet, Place 1 tablet (0.4 mg total) under the tongue every 5 (five) minutes as needed., Disp: 25 tablet, Rfl: 0   pantoprazole (PROTONIX) 40 MG tablet, TAKE 1 TABLET BY MOUTH DAILY, Disp: 90 tablet, Rfl: 0   Polyethyl Glycol-Propyl Glycol (SYSTANE OP), Place 1 drop into both eyes daily as needed (dry eyes)., Disp: , Rfl:    potassium chloride (K-DUR) 10 MEQ tablet, Take 2 tablets (20 mEq total) by mouth daily., Disp: 180 tablet, Rfl: 0   PRESCRIPTION MEDICATION, Inhale into the lungs at bedtime. CPAP, Disp: , Rfl:    propranolol (INDERAL) 20 MG tablet, Take 1 tablet (20 mg total) by mouth 3 (three) times daily., Disp: 270 tablet, Rfl: 0   rosuvastatin (CRESTOR) 40 MG tablet, TAKE 1 TABLET BY MOUTH DAILY, Disp: 90 tablet, Rfl: 0   traMADol (ULTRAM) 50 MG tablet, Take 50 mg by mouth every 8 (eight) hours as needed., Disp: , Rfl:    triamcinolone cream (KENALOG) 0.1 %, Apply 1 application topically 2 (two) times daily., Disp: 30 g, Rfl: 0   trimethoprim-polymyxin b (POLYTRIM) ophthalmic solution, Place 1 drop into the left eye every 4 (four) hours. Do for 7 days (Patient not taking: Reported on 11/22/2018), Disp: 10 mL, Rfl: 0  EXAM:  GENERAL: alert.  Sounds to be in no acute distress.  Answering questions appropriately.    PSYCH/NEURO: pleasant  and cooperative, no obvious depression or anxiety, speech and thought processing grossly intact  ASSESSMENT AND PLAN:  Discussed the following assessment and plan:  CAD S/P PCI pRCA Promus DES 3.5 x 16 (4.1 mm), ostRPDA Promus DES 2.5 x 12 (2.7 mm) Followed by cardiology.  Stable.  Continue risk factor modification.   CKD (chronic kidney disease), stage III (HCC) Avoid antiinflammatories.  Follow metabolic panel.    Diabetes mellitus ([place]) Low carb diet and exercise.  Follow met b and a1c.    Essential hypertension, benign Blood pressure has been under reasonable control. Continue current medication regimen.  Follow pressures.  Follow metabolic panel.    GERD (gastroesophageal reflux disease) Controlled.    Hypercholesterolemia On crestor.  Low cholesterol diet and exercise.  Follow lipid panel and liver function tests.    Lower extremity edema Seeing [facility].  Using lymphedema pump.  Swelling better.  Follow.   Obstructive sleep apnea CPAP.   Paroxysmal atrial fibrillation (HCC) Stable.  On eliquis.  Followed by cardiology.   Stress Discussed with her today.  Breathing stable.  Follow.    Breast pain Breast pain as outlined.  (3:00).  Just had mammogram 11/19/18  - Birads I.  Desires no further evaluation at this time.  Will notify me if pain persists or if she changes her mind.      I discussed the assessment and treatment plan with the patient. The patient was provided an opportunity to ask questions and all were answered. The patient agreed with the plan and demonstrated an understanding of the instructions.   The patient was advised to call back or seek an in-person evaluation if the symptoms worsen or if the condition fails to improve as anticipated.  I provided 21 minutes of non-face-to-face time during this encounter.   Einar Pheasant, MD

## 2018-12-15 DIAGNOSIS — N644 Mastodynia: Secondary | ICD-10-CM | POA: Insufficient documentation

## 2018-12-15 NOTE — Assessment & Plan Note (Signed)
Seeing [facility].  Using lymphedema pump.  Swelling better.  Follow.

## 2018-12-15 NOTE — Assessment & Plan Note (Signed)
Blood pressure has been under reasonable control.  Continue current medication regimen.  Follow pressures.  Follow metabolic panel.  

## 2018-12-15 NOTE — Assessment & Plan Note (Signed)
On crestor.  Low cholesterol diet and exercise.  Follow lipid panel and liver function tests.   

## 2018-12-15 NOTE — Assessment & Plan Note (Signed)
Stable.  On eliquis.  Followed by cardiology.

## 2018-12-15 NOTE — Assessment & Plan Note (Signed)
Low carb diet and exercise.  Follow met b and a1c.   

## 2018-12-15 NOTE — Assessment & Plan Note (Signed)
Discussed with her today.  Breathing stable.  Follow.

## 2018-12-15 NOTE — Assessment & Plan Note (Signed)
Followed by cardiology.  Stable.  Continue risk factor modification.   

## 2018-12-15 NOTE — Assessment & Plan Note (Signed)
CPAP.  

## 2018-12-15 NOTE — Assessment & Plan Note (Signed)
Avoid antiinflammatories.  Follow metabolic panel.  

## 2018-12-15 NOTE — Assessment & Plan Note (Signed)
Breast pain as outlined.  (3:00).  Just had mammogram 11/19/18  - Birads I.  Desires no further evaluation at this time.  Will notify me if pain persists or if she changes her mind.

## 2018-12-15 NOTE — Assessment & Plan Note (Signed)
Controlled.  

## 2019-01-09 ENCOUNTER — Telehealth: Payer: Self-pay

## 2019-01-09 NOTE — Telephone Encounter (Signed)
Copied from [place] [phone]. Topic: General - Other >> Jan 09, 2019  2:28 PM Leward Quan A wrote: Reason for CRM: Patient called to say that she was bitten by some ants and have blisters on her left ankle and foot and need to have it looked at when she come in for her labs and flu shot tomorrow. Asking for a call back if she can be scheduled to see someone because she think she is having an allergic reaction to these bites. Ph# [phone]

## 2019-01-09 NOTE — Telephone Encounter (Signed)
Attempted to reach patient. Phone was busy

## 2019-01-10 ENCOUNTER — Other Ambulatory Visit (INDEPENDENT_AMBULATORY_CARE_PROVIDER_SITE_OTHER): Payer: Medicare Other

## 2019-01-10 ENCOUNTER — Other Ambulatory Visit: Payer: Self-pay

## 2019-01-10 ENCOUNTER — Encounter: Payer: Self-pay | Admitting: Family Medicine

## 2019-01-10 ENCOUNTER — Ambulatory Visit (INDEPENDENT_AMBULATORY_CARE_PROVIDER_SITE_OTHER): Payer: Medicare Other | Admitting: Family Medicine

## 2019-01-10 ENCOUNTER — Ambulatory Visit (INDEPENDENT_AMBULATORY_CARE_PROVIDER_SITE_OTHER): Payer: Medicare Other

## 2019-01-10 VITALS — BP 180/82 | HR 57 | Temp 96.8°F | Resp 20 | Ht 62.0 in

## 2019-01-10 DIAGNOSIS — T63421A Toxic effect of venom of ants, accidental (unintentional), initial encounter: Secondary | ICD-10-CM

## 2019-01-10 DIAGNOSIS — E1159 Type 2 diabetes mellitus with other circulatory complications: Secondary | ICD-10-CM | POA: Diagnosis not present

## 2019-01-10 DIAGNOSIS — I10 Essential (primary) hypertension: Secondary | ICD-10-CM

## 2019-01-10 DIAGNOSIS — E7800 Pure hypercholesterolemia, unspecified: Secondary | ICD-10-CM | POA: Diagnosis not present

## 2019-01-10 DIAGNOSIS — Z23 Encounter for immunization: Secondary | ICD-10-CM | POA: Diagnosis not present

## 2019-01-10 DIAGNOSIS — T148XXA Other injury of unspecified body region, initial encounter: Secondary | ICD-10-CM

## 2019-01-10 LAB — CBC WITH DIFFERENTIAL/PLATELET
Basophils Absolute: 0 10*3/uL (ref 0.0–0.1)
Basophils Relative: 0.5 % (ref 0.0–3.0)
Eosinophils Absolute: 0.1 10*3/uL (ref 0.0–0.7)
Eosinophils Relative: 1.9 % (ref 0.0–5.0)
HCT: 40.6 % (ref 36.0–46.0)
Hemoglobin: 13.4 g/dL (ref 12.0–15.0)
Lymphocytes Relative: 35.4 % (ref 12.0–46.0)
Lymphs Abs: 2.2 10*3/uL (ref 0.7–4.0)
MCHC: 33 g/dL (ref 30.0–36.0)
MCV: 95.5 fl (ref 78.0–100.0)
Monocytes Absolute: 0.7 10*3/uL (ref 0.1–1.0)
Monocytes Relative: 11.7 % (ref 3.0–12.0)
Neutro Abs: 3.1 10*3/uL (ref 1.4–7.7)
Neutrophils Relative %: 50.5 % (ref 43.0–77.0)
Platelets: 200 10*3/uL (ref 150.0–400.0)
RBC: 4.25 Mil/uL (ref 3.87–5.11)
RDW: 13.7 % (ref 11.5–15.5)
WBC: 6.1 10*3/uL (ref 4.0–10.5)

## 2019-01-10 LAB — LIPID PANEL
Cholesterol: 153 mg/dL (ref 0–200)
HDL: 69.8 mg/dL (ref >39.00)
LDL Cholesterol: 65 mg/dL (ref 0–99)
NonHDL: 83.24
Total CHOL/HDL Ratio: 2
Triglycerides: 91 mg/dL (ref 0.0–149.0)
VLDL: 18.2 mg/dL (ref 0.0–40.0)

## 2019-01-10 LAB — HEPATIC FUNCTION PANEL
ALT: 36 U/L — ABNORMAL HIGH (ref 0–35)
AST: 36 U/L (ref 0–37)
Albumin: 3.8 g/dL (ref 3.5–5.2)
Alkaline Phosphatase: 66 U/L (ref 39–117)
Bilirubin, Direct: 0.1 mg/dL (ref 0.0–0.3)
Total Bilirubin: 0.6 mg/dL (ref 0.2–1.2)
Total Protein: 6.1 g/dL (ref 6.0–8.3)

## 2019-01-10 LAB — MICROALBUMIN / CREATININE URINE RATIO
Creatinine,U: 54.6 mg/dL
Microalb Creat Ratio: 1.4 mg/g (ref 0.0–30.0)
Microalb, Ur: 0.8 mg/dL (ref 0.0–1.9)

## 2019-01-10 LAB — BASIC METABOLIC PANEL
BUN: 29 mg/dL — ABNORMAL HIGH (ref 6–23)
CO2: 31 mEq/L (ref 19–32)
Calcium: 9.3 mg/dL (ref 8.4–10.5)
Chloride: 105 mEq/L (ref 96–112)
Creatinine, Ser: 1.42 mg/dL — ABNORMAL HIGH (ref 0.40–1.20)
GFR: 35.57 mL/min — ABNORMAL LOW (ref >60.00)
Glucose, Bld: 105 mg/dL — ABNORMAL HIGH (ref 70–99)
Potassium: 5 mEq/L (ref 3.5–5.1)
Sodium: 143 mEq/L (ref 135–145)

## 2019-01-10 LAB — HEMOGLOBIN A1C: Hgb A1c MFr Bld: 6.5 % (ref 4.6–6.5)

## 2019-01-10 MED ORDER — CEPHALEXIN 500 MG PO CAPS: 500.0000 mg | ORAL_CAPSULE | Freq: Four times a day (QID) | ORAL | 0 refills | Status: DC

## 2019-01-10 NOTE — Patient Instructions (Signed)
Leave blisters alone. If they pop on their own, clean skin with soap/water, pat dry and apply bacitracin and bandaid

## 2019-01-10 NOTE — Progress Notes (Signed)
Subjective:    Patient ID: April [name], female    DOB: [birth year], 78 y.o.   MRN: [id number]  HPI   Patient presents to clinic due to blisters on both legs after being bitten by fire ants.  States she stepped on an ant hill I can feel little ant bites.  States this occurred about a week ago.  Noticed some redness and blistered areas of skin.  Denies fever or chills.  Patient Active Problem List   Diagnosis Date Noted  . Breast pain 12/15/2018  . Lymphedema 07/09/2018  . Chronic heart failure with preserved ejection fraction ([place]) 06/03/2018  . CKD (chronic kidney disease), stage III ([place]) 06/03/2018  . Lower extremity edema 01/19/2018  . Paroxysmal atrial fibrillation ([place]) 03/27/2016  . ST elevation myocardial infarction (STEMI) of inferolateral wall, initial episode of care ([place]) 03/25/2016  . CAD S/P PCI pRCA Promus DES 3.5 x 16 (4.1 mm), ostRPDA Promus DES 2.5 x 12 (2.7 mm) 03/25/2016  . Right knee pain 11/13/2015  . Coronary artery disease involving native coronary artery of native heart with unstable angina pectoris ([place]) 06/01/2015  . Diabetes mellitus ([place]) 05/16/2015  . Gastritis 04/01/2015  . Chest pain 04/01/2015  . Hiatal hernia 08/25/2014  . DOE (dyspnea on exertion) 07/15/2014  . Congestion of throat 07/06/2014  . Health care maintenance 07/06/2014  . Fibrocystic breast disease 07/06/2014  . BMI 38.0-38.9,adult 04/19/2014  . Stress 02/16/2013  . Rib pain on right side 02/16/2013  . SOB (shortness of breath) 10/13/2012  . GERD (gastroesophageal reflux disease) 10/13/2012  . Essential hypertension, benign 08/18/2012  . Hypercholesterolemia 08/18/2012  . History of colonic polyps 08/18/2012  . Obstructive sleep apnea 08/18/2012  . Osteoporosis 08/18/2012   Social History   Tobacco Use  . Smoking status: Never Smoker  . Smokeless tobacco: Never Used  Substance Use Topics  . Alcohol use: No    Alcohol/week: 0.0 standard drinks    Review of Systems   Constitutional: Negative for chills, fatigue and fever.  HENT: Negative for congestion, ear pain, sinus pain and sore throat.   Eyes: Negative.   Respiratory: Negative for cough, shortness of breath and wheezing.   Cardiovascular: Negative for chest pain, palpitations and leg swelling.  Gastrointestinal: Negative for abdominal pain, diarrhea, nausea and vomiting.  Genitourinary: Negative for dysuria, frequency and urgency.  Musculoskeletal: Negative for arthralgias and myalgias.  Skin: +blisters after fire ant bites Neurological: Negative for syncope, light-headedness and headaches.  Psychiatric/Behavioral: The patient is not nervous/anxious.    Objective:   Physical Exam Vitals signs and nursing note reviewed.  Constitutional:      General: She is not in acute distress.    Appearance: She is not ill-appearing, toxic-appearing or diaphoretic.  HENT:     Head: Normocephalic and atraumatic.  Cardiovascular:     Rate and Rhythm: Normal rate and regular rhythm.     Heart sounds: Normal heart sounds.  Pulmonary:     Effort: Pulmonary effort is normal. No respiratory distress.     Breath sounds: Normal breath sounds.  Skin:         Comments: Blister locations indicated by gray marks on diagram.  Blisters are intact.  Patient advised to not pop the blisters.  Neurological:     Mental Status: She is alert.  Psychiatric:        Mood and Affect: Mood normal.        Behavior: Behavior normal.    Today's Vitals   01/10/19  1044  BP: (!) 180/82  Pulse: (!) 57  Resp: 20  Temp: (!) 96.8 F (36 C)  TempSrc: Temporal  SpO2: 95%  Height: 5\' 2"  (1.575 m)   Body mass index is 35.48 kg/m.     Assessment & Plan:    Fire ant bite/blister of skin- we will treat patient with oral antibiotic to cover for any cellulitis skin infection.  Advised to leave the blistered areas alone and monitor for improvement.  Advised that blistering of skin is are bodies way of protecting the area and  allowing for healing.  Advised that if blisters do pop on their own, to clean the skin with soap and water, pat dry and cover with bacitracin and Band-Aid.  Advised that if she does have to do dressing changes to do them daily.  We will have patient follow-up early next week for recheck to be sure skin on legs is improving and blisters are resolving.

## 2019-01-14 ENCOUNTER — Other Ambulatory Visit: Payer: Self-pay | Admitting: Internal Medicine

## 2019-01-14 DIAGNOSIS — R944 Abnormal results of kidney function studies: Secondary | ICD-10-CM

## 2019-01-14 NOTE — Progress Notes (Signed)
Order placed for f/u met b 

## 2019-01-15 ENCOUNTER — Encounter: Payer: Self-pay | Admitting: Family Medicine

## 2019-01-15 ENCOUNTER — Ambulatory Visit (INDEPENDENT_AMBULATORY_CARE_PROVIDER_SITE_OTHER): Payer: Medicare Other | Admitting: Family Medicine

## 2019-01-15 ENCOUNTER — Other Ambulatory Visit: Payer: Self-pay

## 2019-01-15 VITALS — BP 136/68 | HR 70 | Temp 96.5°F | Resp 20 | Ht 60.5 in | Wt 195.8 lb

## 2019-01-15 DIAGNOSIS — T148XXA Other injury of unspecified body region, initial encounter: Secondary | ICD-10-CM | POA: Diagnosis not present

## 2019-01-15 DIAGNOSIS — T63421A Toxic effect of venom of ants, accidental (unintentional), initial encounter: Secondary | ICD-10-CM

## 2019-01-15 MED ORDER — CEPHALEXIN 500 MG PO CAPS: 500.0000 mg | ORAL_CAPSULE | Freq: Four times a day (QID) | ORAL | 0 refills | Status: DC

## 2019-01-15 NOTE — Patient Instructions (Signed)
Change dressing every other day on right leg and as needed if wet  Monitor for redness or any other changes in skin

## 2019-01-15 NOTE — Progress Notes (Signed)
Subjective:    Patient ID: April [name], female    DOB: 28-Dec-1940, 78 y.o.   MRN: [id number]  HPI   Patient presents to clinic for follow-up on fire ant bites and blistering of skin.  Blister on left extremity has decreased in size and most of fluid is reabsorbed.  Blister on right lower extremity did begin to leak on its own.  Patient saw the nurse at the [facility] who placed a dressing over the open part of the blister for her.  She is tolerating antibiotic without any problems.  Redness present at last visit has decreased tremendously.  She has no fever or chills.  No worsening pain.  Patient Active Problem List   Diagnosis Date Noted  . Breast pain 12/15/2018  . Lymphedema 07/09/2018  . Chronic heart failure with preserved ejection fraction ([place]) 06/03/2018  . CKD (chronic kidney disease), stage III ([place]) 06/03/2018  . Lower extremity edema 01/19/2018  . Paroxysmal atrial fibrillation ([place]) 03/27/2016  . ST elevation myocardial infarction (STEMI) of inferolateral wall, initial episode of care ([place]) 03/25/2016  . CAD S/P PCI pRCA Promus DES 3.5 x 16 (4.1 mm), ostRPDA Promus DES 2.5 x 12 (2.7 mm) 03/25/2016  . Right knee pain 11/13/2015  . Coronary artery disease involving native coronary artery of native heart with unstable angina pectoris ([place]) 06/01/2015  . Diabetes mellitus ([place]) 05/16/2015  . Gastritis 04/01/2015  . Chest pain 04/01/2015  . Hiatal hernia 08/25/2014  . DOE (dyspnea on exertion) 07/15/2014  . Congestion of throat 07/06/2014  . Health care maintenance 07/06/2014  . Fibrocystic breast disease 07/06/2014  . BMI 38.0-38.9,adult 04/19/2014  . Stress 02/16/2013  . Rib pain on right side 02/16/2013  . SOB (shortness of breath) 10/13/2012  . GERD (gastroesophageal reflux disease) 10/13/2012  . Essential hypertension, benign 08/18/2012  . Hypercholesterolemia 08/18/2012  . History of colonic polyps 08/18/2012  . Obstructive sleep apnea 08/18/2012   . Osteoporosis 08/18/2012   Social History   Tobacco Use  . Smoking status: Never Smoker  . Smokeless tobacco: Never Used  Substance Use Topics  . Alcohol use: No    Alcohol/week: 0.0 standard drinks   Review of Systems  Constitutional: Negative for chills, fatigue and fever.  HENT: Negative for congestion, ear pain, sinus pain and sore throat.   Eyes: Negative.   Respiratory: Negative for cough, shortness of breath and wheezing.   Cardiovascular: Negative for chest pain, palpitations and leg swelling.  Gastrointestinal: Negative for abdominal pain, diarrhea, nausea and vomiting.  Genitourinary: Negative for dysuria, frequency and urgency.  Musculoskeletal: Negative for arthralgias and myalgias.  Skin: Blistered areas improving.  Neurological: Negative for syncope, light-headedness and headaches.  Psychiatric/Behavioral: The patient is not nervous/anxious.       Objective:   Physical Exam  Vitals signs and nursing note reviewed.  Constitutional:      General: She is not in acute distress.    Appearance: She is not ill-appearing, toxic-appearing or diaphoretic.  HENT:     Head: Normocephalic and atraumatic.  Cardiovascular:     Rate and Rhythm: Normal rate and regular rhythm.     Heart sounds: Normal heart sounds.  Pulmonary:     Effort: Pulmonary effort is normal. No respiratory distress.     Breath sounds: Normal breath sounds.  Skin:         Comments: Blister locations indicated by green marks on diagram. Blister on RLE opening on own, draining clear fluid. Cleansed with alcohol,  bacitracin and non adherent gauze dressing applied. Blister on LLE almost completely gone, intact.   Neurological:     Mental Status: She is alert.  Psychiatric:        Mood and Affect: Mood normal.        Behavior: Behavior normal.   Today's Vitals   01/15/19 1030  BP: 136/68  Pulse: 70  Resp: 20  Temp: (!) 96.5 F (35.8 C)  TempSrc: Temporal  SpO2: 96%  Weight: 195 lb 12.8 oz  (88.8 kg)  Height: 5' 0.5" (1.537 m)   Body mass index is 37.61 kg/m.     Assessment & Plan:   1. Fire ant bite, accidental or unintentional, initial encounter We will extend Keflex course out to total of 2 weeks due to patient having blister open on right lower extremity.  She will monitor area for any increasing redness, development of fever or any other signs of infection.  She will change dressing every other day and as needed if dressing becomes wet or soiled.  Patient does have access to nursing staff at the [facility] if needed, but patient given instruction by me and how to change dressing at home herself.  - cephALEXin (KEFLEX) 500 MG capsule; Take 1 capsule (500 mg total) by mouth 4 (four) times daily. Take in addition to previous Rx to make 2 week total  Dispense: 28 capsule; Refill: 0  3. Blistering of skin Advised to avoid poking it blistered areas as listers are the body's way of allowing skin to heal.  Overall fire ant bites and blisters seem to be healing quite well.  Patient will be going away to the beach next week with family, she does not plan to be out on the stander in the pool just wants to spend time with her family in the house rental.  She understands signs to be aware of to look out for any worsening infection.  Encouraged to do follow-up appointment in about 1 week here for recheck of skin, but declines due to going away and also states if needed she can have family member look at area and help her with dressing.

## 2019-01-17 ENCOUNTER — Telehealth: Payer: Self-pay | Admitting: *Deleted

## 2019-01-24 ENCOUNTER — Encounter: Payer: Self-pay | Admitting: Family Medicine

## 2019-01-24 ENCOUNTER — Other Ambulatory Visit: Payer: Medicare Other

## 2019-01-24 ENCOUNTER — Other Ambulatory Visit: Payer: Self-pay

## 2019-01-24 ENCOUNTER — Ambulatory Visit (INDEPENDENT_AMBULATORY_CARE_PROVIDER_SITE_OTHER): Payer: Medicare Other | Admitting: Family Medicine

## 2019-01-24 VITALS — BP 112/62 | HR 65 | Temp 95.3°F | Resp 20 | Ht 60.0 in | Wt 194.0 lb

## 2019-01-24 DIAGNOSIS — T63421D Toxic effect of venom of ants, accidental (unintentional), subsequent encounter: Secondary | ICD-10-CM

## 2019-01-24 DIAGNOSIS — R944 Abnormal results of kidney function studies: Secondary | ICD-10-CM

## 2019-01-24 DIAGNOSIS — R600 Localized edema: Secondary | ICD-10-CM

## 2019-01-24 DIAGNOSIS — L259 Unspecified contact dermatitis, unspecified cause: Secondary | ICD-10-CM

## 2019-01-24 LAB — BASIC METABOLIC PANEL
BUN: 19 mg/dL (ref 6–23)
CO2: 26 mEq/L (ref 19–32)
Calcium: 9 mg/dL (ref 8.4–10.5)
Chloride: 104 mEq/L (ref 96–112)
Creatinine, Ser: 1.17 mg/dL (ref 0.40–1.20)
GFR: 44.48 mL/min — ABNORMAL LOW (ref >60.00)
Glucose, Bld: 106 mg/dL — ABNORMAL HIGH (ref 70–99)
Potassium: 3.8 mEq/L (ref 3.5–5.1)
Sodium: 141 mEq/L (ref 135–145)

## 2019-01-24 NOTE — Progress Notes (Signed)
Subjective:    Patient ID: April [name], female    DOB: 02-Oct-1940, 78 y.o.   MRN: [id number]  HPI  Patient presents to clinic due to spots on right upper thigh, wondering if she is have an active rash or allergic reaction.  Rash area is slightly itchy, but seems to have lessened as the day has gone on.  No new soaps, lotions patient also would like fire ant bite spots rechecked for continued healing.  They seem to have dried up.  Patient did go to the beach last week, enjoyed the time there.  Did not go on to the beach; mainly sat by the pool and enjoyed the warm weather.   Patient Active Problem List   Diagnosis Date Noted  . Breast pain 12/15/2018  . Lymphedema 07/09/2018  . Chronic heart failure with preserved ejection fraction ([place]) 06/03/2018  . CKD (chronic kidney disease), stage III ([place]) 06/03/2018  . Lower extremity edema 01/19/2018  . Paroxysmal atrial fibrillation ([place]) 03/27/2016  . ST elevation myocardial infarction (STEMI) of inferolateral wall, initial episode of care ([place]) 03/25/2016  . CAD S/P PCI pRCA Promus DES 3.5 x 16 (4.1 mm), ostRPDA Promus DES 2.5 x 12 (2.7 mm) 03/25/2016  . Right knee pain 11/13/2015  . Coronary artery disease involving native coronary artery of native heart with unstable angina pectoris ([place]) 06/01/2015  . Diabetes mellitus ([place]) 05/16/2015  . Gastritis 04/01/2015  . Chest pain 04/01/2015  . Hiatal hernia 08/25/2014  . DOE (dyspnea on exertion) 07/15/2014  . Congestion of throat 07/06/2014  . Health care maintenance 07/06/2014  . Fibrocystic breast disease 07/06/2014  . BMI 38.0-38.9,adult 04/19/2014  . Stress 02/16/2013  . Rib pain on right side 02/16/2013  . SOB (shortness of breath) 10/13/2012  . GERD (gastroesophageal reflux disease) 10/13/2012  . Essential hypertension, benign 08/18/2012  . Hypercholesterolemia 08/18/2012  . History of colonic polyps 08/18/2012  . Obstructive sleep apnea 08/18/2012  . Osteoporosis  08/18/2012   Social History   Tobacco Use  . Smoking status: Never Smoker  . Smokeless tobacco: Never Used  Substance Use Topics  . Alcohol use: No    Alcohol/week: 0.0 standard drinks    Review of Systems  Constitutional: Negative for chills, fatigue and fever.  HENT: Negative for congestion, ear pain, sinus pain and sore throat.   Eyes: Negative.   Respiratory: Negative for cough, shortness of breath and wheezing.   Cardiovascular: Negative for chest pain, palpitations. +leg swelling bilat off and on  Gastrointestinal: Negative for abdominal pain, diarrhea, nausea and vomiting.  Genitourinary: Negative for dysuria, frequency and urgency.  Musculoskeletal: Negative for arthralgias and myalgias.  Skin:?rash right upper leg, recheck fire ant bites Neurological: Negative for syncope, light-headedness and headaches.  Psychiatric/Behavioral: The patient is not nervous/anxious.       Objective:   Physical Exam Vitals signs and nursing note reviewed.  Constitutional:      General: She is not in acute distress.    Appearance: She is not ill-appearing, toxic-appearing or diaphoretic.  Cardiovascular:     Rate and Rhythm: Normal rate and regular rhythm.     Heart sounds: Normal heart sounds.  Pulmonary:     Effort: Pulmonary effort is normal. No respiratory distress.     Breath sounds: Normal breath sounds.  Musculoskeletal:     Right lower leg: Edema present.     Left lower leg: Edema present.     Comments: Bilat LE edema, +1 chronic  Skin:  General: Skin is warm and dry.          Comments: Fire ant bite locations are presented by red marks on diagram.  It they have dried up and seem to be healing quite well.  No signs of infection present.  Rash areas on the left upper thigh represented by blue marks on diagram.  Appear faintly red in color, are not open or draining, no vesicles or pustules  Neurological:     Mental Status: She is alert and oriented to person, place, and  time.  Psychiatric:        Mood and Affect: Mood normal.        Behavior: Behavior normal.    Today's Vitals   01/24/19 1334  BP: 112/62  Pulse: 65  Resp: 20  Temp: (!) 95.3 F (35.2 C)  TempSrc: Temporal  SpO2: 94%  Weight: 194 lb (88 kg)  Height: 5' (1.524 m)   Body mass index is 37.89 kg/m.     Assessment & Plan:    Fire ant bite -areas seem to be healing up well.  Advised that she can keep areas open to air and does not have to cover.  Contact dermatitis -rash on left upper thigh appears consistent with a contact dermatitis.  Patient will apply thin layer of triamcinolone cream on rash areas to calm down redness.  Patient already has a bottle of triamcinolone cream at home and does not require new prescription.  Bilateral lower extremity swelling -this is a chronic issue for patient.  Advised to elevate legs whenever able to and can do things like wrapping legs with Ace bandages and or wearing taller socks to help combat swelling.  Patient will keep regular follow-up with PCP as planned.  She will return to clinic sooner if any issues arise.

## 2019-01-28 NOTE — Telephone Encounter (Signed)
opened in error

## 2019-02-05 ENCOUNTER — Other Ambulatory Visit: Payer: Self-pay | Admitting: Cardiovascular Disease

## 2019-02-05 NOTE — Telephone Encounter (Signed)
Please review for refill, Thanks !  

## 2019-02-05 NOTE — Telephone Encounter (Signed)
Pt's age 78, wt 83 kg, SCr 1.17, CrCl 53.28, last ov w/ Dr. Rockey Situ 06/03/18.

## 2019-02-05 NOTE — Telephone Encounter (Signed)
66f Scr 1.17 01/24/19 86kg Lovw/gollan 06/03/18

## 2019-03-13 ENCOUNTER — Other Ambulatory Visit: Payer: Self-pay | Admitting: Cardiovascular Disease

## 2019-04-10 ENCOUNTER — Other Ambulatory Visit: Payer: Self-pay | Admitting: Internal Medicine

## 2019-05-14 ENCOUNTER — Other Ambulatory Visit: Payer: Self-pay | Admitting: Cardiovascular Disease

## 2019-05-14 ENCOUNTER — Other Ambulatory Visit: Payer: Self-pay | Admitting: Internal Medicine

## 2019-05-21 DIAGNOSIS — H02834 Dermatochalasis of left upper eyelid: Secondary | ICD-10-CM | POA: Diagnosis not present

## 2019-05-21 DIAGNOSIS — H02831 Dermatochalasis of right upper eyelid: Secondary | ICD-10-CM | POA: Diagnosis not present

## 2019-05-26 DIAGNOSIS — X32XXXA Exposure to sunlight, initial encounter: Secondary | ICD-10-CM | POA: Diagnosis not present

## 2019-05-26 DIAGNOSIS — Z1283 Encounter for screening for malignant neoplasm of skin: Secondary | ICD-10-CM | POA: Diagnosis not present

## 2019-05-26 DIAGNOSIS — L821 Other seborrheic keratosis: Secondary | ICD-10-CM | POA: Diagnosis not present

## 2019-05-26 DIAGNOSIS — L570 Actinic keratosis: Secondary | ICD-10-CM | POA: Diagnosis not present

## 2019-05-26 DIAGNOSIS — L089 Local infection of the skin and subcutaneous tissue, unspecified: Secondary | ICD-10-CM | POA: Diagnosis not present

## 2019-05-28 DIAGNOSIS — H02831 Dermatochalasis of right upper eyelid: Secondary | ICD-10-CM | POA: Diagnosis not present

## 2019-06-05 DIAGNOSIS — H02403 Unspecified ptosis of bilateral eyelids: Secondary | ICD-10-CM | POA: Diagnosis not present

## 2019-06-17 ENCOUNTER — Other Ambulatory Visit: Payer: Self-pay | Admitting: Cardiovascular Disease

## 2019-06-21 NOTE — Progress Notes (Signed)
Cardiology Office Note  Date:  06/23/2019   ID:  April [name], DOB [birth year], MRN [id number]  PCP:  April Pheasant, MD   Chief Complaint  Patient presents with  . office visit    12 month F/U; Meds verbally reviewed with patient.    HPI:  April [name] is a pleasant 79 year old woman with history of  coronary artery disease,  chronic shortness of breath  STEMI November 2017 stent x2 to her RCA atrial fibrillation while in the hospital November 2017 elevated CHADS VASC (at least 4) who presents for follow-up of her coronary artery disease , atrial fib  Prior stenting details from 2017 CAD S/P PCI pRCA Promus DES 3.5 x 16 (4.1 mm), ostRPDA Promus DES 2.5 x 12 (2.7 mm)  done at [facility] by report  In follow-up today she reports everything is relatively stable She is having some weakness with speech Has done 6 week speech therapy  Doing physical therapy at  [facility]   chronic back pain Previously seen by April [name] in [place]  Lab work reviewed  hemoglobin A1c 6.5 Total chol 153, LDL 65  EKG personally reviewed by myself on todays visit Shows sinus bradycardia rate 55 bpm  Interventricular conduction delay/incomplete left bundle branch block  Reports having episode of chest pain while she was at the beach Took nitroglycerin x2, did not seem to help Went to urgent care initially Was sent to hospital 01/2018 Was seen at [facility], there was troponin elevation 0.07 down to 0.049 on October 7 and 8   Echo  1. Normal biventricular systolic function and size. LVEF 55-60%.  2. No significant valvular dysfunction.  3. Grade II diastolic dysfunction with elevated left-sided filling  pressures.  4. RVSP 41 mmHg consistent with mild pulmonary hypertension  Full cardiac catheterization report is not available but discharge summary reports stents were patent to the RCA    PMH:   has a past medical history of CAD S/P  percutaneous coronary angioplasty, CKD (chronic kidney disease), stage III, Diastolic dysfunction, Diverticulitis, Endometriosis, Hiatal hernia, History of colon polyps (08/1994), History of Migraines, Hypercholesterolemia, Hypertension, LBBB (left bundle branch block), Osteoporosis, PAF (paroxysmal atrial fibrillation) ([place]), Rheumatic fever, Sleep apnea, ST elevation myocardial infarction (STEMI) of inferolateral wall, initial episode of care ([place]) (03/25/2016), and Urine incontinence.  PSH:    Past Surgical History:  Procedure Laterality Date  . ABDOMINAL HYSTERECTOMY     ovaries not removed  . APPENDECTOMY     was removed during hysterectomy  . Breast biopsies     x2  . BREAST BIOPSY Bilateral "years ago"   neg  . BREAST SURGERY Bilateral   . CARDIAC CATHETERIZATION  2011   moderate 40% RCA disease  . CARDIAC CATHETERIZATION  01/2010   Dr. [email or web address] [name] : Only noted 40% RCA  . CARDIAC CATHETERIZATION N/A 03/25/2016   Procedure: Left Heart Cath and Coronary Angiography;  Surgeon: 04/07/2016, MD;  Location: [facility];  Service: Cardiovascular;  Laterality: N/A;  . CARDIAC CATHETERIZATION N/A 03/25/2016   Procedure: Coronary Stent Intervention;  Surgeon: 04/07/2016, MD;  Location: [facility];  Service: Cardiovascular;  Laterality: N/A;  . COLONOSCOPY  2013  . ESOPHAGOGASTRODUODENOSCOPY (EGD) WITH PROPOFOL N/A 03/17/2015   Procedure: ESOPHAGOGASTRODUODENOSCOPY (EGD) WITH PROPOFOL;  Surgeon: 03/30/2015, MD;  Location: [facility] ENDOSCOPY;  Service: Gastroenterology;  Laterality: N/A;  . NM MYOVIEW ([place] H[facility])  07/2015   No evidence ischemia or infarction. EF 66%.  Low risk  . TONSILLECTOMY    . TRANSTHORACIC ECHOCARDIOGRAM  10/2013   Normal LV size and function. EF 55-65%. GR 1 DD. Otherwise normal.    Current Outpatient Medications  Medication Sig Dispense Refill  . acetaminophen (TYLENOL) 500 MG tablet Take 500 mg by mouth daily as needed for headache  (pain).    Marland Kitchen albuterol (PROVENTIL HFA;VENTOLIN HFA) 108 (90 Base) MCG/ACT inhaler Inhale 2 puffs into the lungs every 6 (six) hours as needed for wheezing or shortness of breath. 1 Inhaler 6  . amiodarone (PACERONE) 200 MG tablet TAKE 1 TABLET BY MOUTH DAILY 90 tablet 2  . aspirin EC 81 MG tablet Take 1 tablet (81 mg total) by mouth daily. 90 tablet 3  . azelastine (ASTELIN) 0.1 % nasal spray Place 2 sprays into both nostrils daily as needed for rhinitis. Use in each nostril as directed    . BREO ELLIPTA 100-25 MCG/INH AEPB INHALE 1 PUFF INTO THE LUNGS DAILY 60 each 0  . cholecalciferol (VITAMIN D) 1000 units tablet Take 1,000 Units by mouth 2 (two) times daily.    Marland Kitchen ELIQUIS 5 MG TABS tablet TAKE 1 TABLET BY MOUTH TWICE A DAY 180 tablet 1  . fish oil-omega-3 fatty acids 1000 MG capsule Take 1 g by mouth 2 (two) times daily.     . furosemide (LASIX) 40 MG tablet Take 1 tablet (40 mg) in the AM then take 0.5 tablet (20 mg) in the PM. 135 tablet 3  . hydrALAZINE (APRESOLINE) 10 MG tablet TAKE ONE TABLET BY MOUTH THREE TIMES A DAY 270 tablet 2  . losartan (COZAAR) 100 MG tablet TAKE 1 TABLET BY MOUTH DAILY 90 tablet 0  . Menthol, Topical Analgesic, (BIOFREEZE EX) Apply 1 application topically daily as needed (pain).    . Misc Natural Products (OSTEO BI-FLEX JOINT SHIELD PO) Take 1 tablet by mouth 2 (two) times daily.     . Multiple Vitamin (MULTIVITAMIN WITH MINERALS) TABS tablet Take 0.5 tablets by mouth 2 (two) times daily.    . nitroGLYCERIN (NITROSTAT) 0.4 MG SL tablet Place 1 tablet (0.4 mg total) under the tongue every 5 (five) minutes as needed. 25 tablet 0  . pantoprazole (PROTONIX) 40 MG tablet TAKE 1 TABLET BY MOUTH DAILY 90 tablet 2  . Polyethyl Glycol-Propyl Glycol (SYSTANE OP) Place 1 drop into both eyes daily as needed (dry eyes).    . potassium chloride (KLOR-CON) 10 MEQ tablet TAKE 1 TABLET BY MOUTH TWICE A DAY 180 tablet 2  . PRESCRIPTION MEDICATION Inhale into the lungs at bedtime.  CPAP    . propranolol (INDERAL) 20 MG tablet TAKE ONE TABLET BY MOUTH THREE TIMES A DAY 270 tablet 2  . rosuvastatin (CRESTOR) 40 MG tablet TAKE 1 TABLET BY MOUTH DAILY 90 tablet 2  . traMADol (ULTRAM) 50 MG tablet Take 50 mg by mouth every 8 (eight) hours as needed.    . cephALEXin (KEFLEX) 500 MG capsule Take 1 capsule (500 mg total) by mouth 4 (four) times daily. Take in addition to previous Rx to make 2 week total 28 capsule 0  . mupirocin ointment (BACTROBAN) 2 % Place 1 application into the nose 2 (two) times daily. Use thin layer of ointment on skin. 22 g 0  . triamcinolone cream (KENALOG) 0.1 % Apply 1 application topically 2 (two) times daily. 30 g 0  . trimethoprim-polymyxin b (POLYTRIM) ophthalmic solution Place 1 drop into the left eye every 4 (four) hours. Do for 7 days 10  mL 0   No current facility-administered medications for this visit.     Allergies:   Penicillins and Sulfa antibiotics   Social History:  The patient  reports that she has never smoked. She has never used smokeless tobacco. She reports that she does not drink alcohol or use drugs.   Family History:   family history includes Arthritis in her mother; Breast cancer in an other family member; Heart attack in her daughter, father, and sister; Heart disease in her father; Hypertension in her mother; Osteoporosis in her mother; Stroke in her brother, daughter, and mother.    Review of Systems: Review of Systems  Respiratory: Negative.   Cardiovascular: Negative.   Gastrointestinal: Negative.   Musculoskeletal: Negative.   Neurological: Negative.   Psychiatric/Behavioral: Negative.   All other systems reviewed and are negative.   PHYSICAL EXAM: VS:  BP (!) 146/74 (BP Location: Left Arm, Patient Position: Sitting, Cuff Size: Large)   Pulse (!) 55   Ht 5' (1.524 m)   Wt 195 lb 8 oz (88.7 kg)   LMP 05/01/1972   SpO2 96%   BMI 38.18 kg/m  , BMI Body mass index is 38.18 kg/m. Constitutional:  oriented to  person, place, and time. No distress.  HENT:  Head: Grossly normal Eyes:  no discharge. No scleral icterus.  Neck: No JVD, no carotid bruits  Cardiovascular: Regular rate and rhythm, no murmurs appreciated Pulmonary/Chest: Clear to auscultation bilaterally, no wheezes or rails Abdominal: Soft.  no distension.  no tenderness.  Musculoskeletal: Normal range of motion Neurological:  normal muscle tone. Coordination normal. No atrophy Skin: Skin warm and dry Psychiatric: normal affect, pleasant   Recent Labs: 08/20/2018: TSH 4.50 01/10/2019: ALT 36; Hemoglobin 13.4; Platelets 200.0 01/24/2019: BUN 19; Creatinine, Ser 1.17; Potassium 3.8; Sodium 141    Lipid Panel Lab Results  Component Value Date   CHOL 153 01/10/2019   HDL 69.80 01/10/2019   LDLCALC 65 01/10/2019   TRIG 91.0 01/10/2019      Wt Readings from Last 3 Encounters:  06/23/19 195 lb 8 oz (88.7 kg)  01/24/19 194 lb (88 kg)  01/15/19 195 lb 12.8 oz (88.8 kg)     ASSESSMENT AND PLAN:  Essential hypertension, benign Blood pressure is well controlled on today's visit. No changes made to the medications.  Hypercholesterolemia Cholesterol at goal, continue current medications  Coronary artery disease involving native coronary artery of native heart with unstable angina pectoris ([place]) Currently with no symptoms of angina. No further workup at this time. Continue current medication regimen.  Paroxysmal atrial fibrillation Tolerating anticoagulation, maintain normal sinus rhythm elevated CHADS VASC (at least 4)  SOB (shortness of breath) Recommend she continue her physical therapy at [facility]  Total encounter time more than 25 minutes Greater than 50% was spent in counseling and coordination of care with the patient  Disposition:   F/U  12 months   Orders Placed This Encounter  Procedures  . EKG 12-Lead     Signed, Esmond Plants, M.D., Ph.D. 06/23/2019  [place],  [place]

## 2019-06-23 ENCOUNTER — Encounter: Payer: Self-pay | Admitting: Cardiovascular Disease

## 2019-06-23 ENCOUNTER — Other Ambulatory Visit: Payer: Self-pay

## 2019-06-23 ENCOUNTER — Ambulatory Visit (INDEPENDENT_AMBULATORY_CARE_PROVIDER_SITE_OTHER): Payer: Medicare Other | Admitting: Cardiovascular Disease

## 2019-06-23 VITALS — BP 146/74 | HR 55 | Ht 60.0 in | Wt 195.5 lb

## 2019-06-23 DIAGNOSIS — E1159 Type 2 diabetes mellitus with other circulatory complications: Secondary | ICD-10-CM | POA: Diagnosis not present

## 2019-06-23 DIAGNOSIS — I10 Essential (primary) hypertension: Secondary | ICD-10-CM

## 2019-06-23 DIAGNOSIS — E785 Hyperlipidemia, unspecified: Secondary | ICD-10-CM | POA: Diagnosis not present

## 2019-06-23 DIAGNOSIS — I5032 Chronic diastolic (congestive) heart failure: Secondary | ICD-10-CM | POA: Diagnosis not present

## 2019-06-23 DIAGNOSIS — E7800 Pure hypercholesterolemia, unspecified: Secondary | ICD-10-CM | POA: Diagnosis not present

## 2019-06-23 DIAGNOSIS — I480 Paroxysmal atrial fibrillation: Secondary | ICD-10-CM

## 2019-06-23 DIAGNOSIS — N183 Chronic kidney disease, stage 3 unspecified: Secondary | ICD-10-CM

## 2019-06-23 DIAGNOSIS — I25118 Atherosclerotic heart disease of native coronary artery with other forms of angina pectoris: Secondary | ICD-10-CM | POA: Diagnosis not present

## 2019-06-23 NOTE — Patient Instructions (Signed)

## 2019-08-06 ENCOUNTER — Other Ambulatory Visit: Payer: Self-pay

## 2019-08-06 ENCOUNTER — Encounter: Payer: Self-pay | Admitting: Ophthalmology

## 2019-08-11 ENCOUNTER — Telehealth: Payer: Self-pay | Admitting: Cardiovascular Disease

## 2019-08-11 ENCOUNTER — Telehealth: Payer: Self-pay | Admitting: Internal Medicine

## 2019-08-11 ENCOUNTER — Telehealth (INDEPENDENT_AMBULATORY_CARE_PROVIDER_SITE_OTHER): Payer: Self-pay

## 2019-08-11 NOTE — Telephone Encounter (Signed)
Patient left a voicemail informing that will be having eye surgery in [place] on 08/15/19 with Amy Vickki Muff and was advise to let her provider know in our office.

## 2019-08-11 NOTE — Telephone Encounter (Signed)
Pt does not have appt with you. Do you want to see her prior to surgery or are you ok to see her after? She sees Dr Rockey Situ for cardiology.

## 2019-08-11 NOTE — Telephone Encounter (Signed)
Can schedule a pre op evaluation.  I would also recommend for her to let Dr Rockey Situ know of surgery - for cardiac recs.

## 2019-08-11 NOTE — Telephone Encounter (Signed)
Pt said that she is having eye surgery in [place] with Norlene Duel on 08/15/19 and they told her to let her pcp know.

## 2019-08-11 NOTE — Telephone Encounter (Signed)
   [facility] Pre-operative Risk Assessment    Request for surgical clearance:  1. What type of surgery is being performed? Bleph/ptosis repair  2. When is this surgery scheduled? 4/16  3. What type of clearance is required (medical clearance vs. Pharmacy clearance to hold med vs. Both)? pharm  4. Are there any medications that need to be held prior to surgery and how long? Eliquis 4 days prior  5. Practice name and name of physician performing surgery? [facility]   6. What is your office phone number [phone]   7.   What is your office fax number [phone]  8.   Anesthesia type (None, local, MAC, general) ? Not noted   Marykay Lex 08/11/2019, 4:52 PM  _________________________________________________________________   (provider comments below)

## 2019-08-12 NOTE — Telephone Encounter (Signed)
Patient calling back to check status. Patient also states she stopped taking medication as of this morning on her own

## 2019-08-12 NOTE — Telephone Encounter (Signed)
Patient with diagnosis of afib on Eliquis for anticoagulation.    Procedure:  Bleph/ptosis repair Date of procedure: 08/15/19  CHADS2-VASc score of  7 (CHF, HTN, AGE, DM2, CAD, AGE, female)  CrCl 37 ml/min  Due to patients high CHADS2-VASc score I will defer to Dr. Rockey Situ.

## 2019-08-12 NOTE — Telephone Encounter (Signed)
Noted.  Keep [place] posted and let us know how surgery goes.

## 2019-08-12 NOTE — Telephone Encounter (Signed)
Patient stated that she has already done a pre op with [place] [facility] and has discussed with Dr Rockey Situ- he is going to advise when to stop blood thinner, etc. Pt stated this was just an FYI so PCP was aware she was having this done. Pt stated she does not have time this week to come in for a pre op but is agreeable to follow up with PCP after surgery.

## 2019-08-13 ENCOUNTER — Other Ambulatory Visit: Payer: Self-pay

## 2019-08-13 ENCOUNTER — Other Ambulatory Visit
Admission: RE | Admit: 2019-08-13 | Discharge: 2019-08-13 | Disposition: A | Discharge status: Home / Self Care | Payer: Medicare Other | Source: Ambulatory Visit | Attending: Ophthalmology | Admitting: Ophthalmology

## 2019-08-13 DIAGNOSIS — Z20822 Contact with and (suspected) exposure to covid-19: Secondary | ICD-10-CM | POA: Diagnosis not present

## 2019-08-13 DIAGNOSIS — Z01812 Encounter for preprocedural laboratory examination: Secondary | ICD-10-CM | POA: Diagnosis not present

## 2019-08-13 LAB — SARS CORONAVIRUS 2 (TAT 6-24 HRS): SARS Coronavirus 2: NEGATIVE

## 2019-08-13 NOTE — Telephone Encounter (Signed)
Left detailed message for patient.

## 2019-08-13 NOTE — Telephone Encounter (Signed)
   Primary Cardiologist: Ida Rogue, MD  Chart reviewed as part of pre-operative protocol coverage.   Per pharmacy and Dr. Rockey Situ, patient can hold eliquis 3 days prior to her upcoming surgery with plans to restart as soon as she is cleared to do so by her surgeon.  I will route this recommendation to the requesting party via Epic fax function and remove from pre-op pool.  Please call with questions.  Abigail Butts, PA-C 08/13/2019, 2:06 PM

## 2019-08-13 NOTE — Telephone Encounter (Signed)
Left message for pt to call back so that I may d/w pt recommendations on holding her Eliquis for her upcoming procedure.

## 2019-08-13 NOTE — Discharge Instructions (Signed)
INSTRUCTIONS FOLLOWING OCULOPLASTIC SURGERY AMY M. FOWLER, MD  AFTER YOUR EYE SURGERY, THER ARE MANY THINGS WHICH YOU, THE PATIENT, CAN DO TO ASSURE THE BEST POSSIBLE RESULT FROM YOUR OPERATION.  THIS SHEET SHOULD BE REFERRED TO WHENEVER QUESTIONS ARISE.  IF THERE ARE ANY QUESTIONS NOT ANSWERED HERE, DO NOT HESITATE TO CALL OUR OFFICE AT [phone] OR [phone].  THERE IS ALWAYS SOMEONE AVAILABLE TO CALL IF QUESTIONS OR PROBLEMS ARISE.  VISION: Your vision may be blurred and out of focus after surgery until you are able to stop using your ointment, swelling resolves and your eye(s) heal. This may take 1 to 2 weeks at the least.  If your vision becomes gradually more dim or dark, this is not normal and you need to call our office immediately.  EYE CARE: For the first 48 hours after surgery, use ice packs frequently - "20 minutes on, 20 minutes off" - to help reduce swelling and bruising.  Small bags of frozen peas or corn make good ice packs along with cloths soaked in ice water.  If you are wearing a patch or other type of dressing following surgery, keep this on for the amount of time specified by your doctor.  For the first week following surgery, you will need to treat your stitches with great care.  It is OK to shower, but take care to not allow soapy water to run into your eye(s) to help reduce chances of infection.  You may gently clean the eyelashes and around the eye(s) with cotton balls and sterile water, BUT DO NOT RUB THE STITCHES VIGOROUSLY.  Keeping your stitches moist with ointment will help promote healing with minimal scar formation.  ACTIVITY: When you leave the surgery center, you should go home, rest and be inactive.  The eye(s) may feel scratchy and keeping the eyes closed will allow for faster healing.  The first week following surgery, avoid straining (anything making the face turn red) or lifting over 20 pounds.  Additionally, avoid bending which causes your head to go below  your waist.  Using your eyes will NOT harm them, so feel free to read, watch television, use the computer, etc as desired.  Driving depends on each individual, so check with your doctor if you have questions about driving. Do not wear contact lenses for about 2 weeks.  Do not wear eye makeup for 2 weeks.  Avoid swimming, hot tubs, gardening, and dusting for 1 to 2 weeks to reduce the risk of an infection.  MEDICATIONS:  You will be given a prescription for an ointment to use 4 times a day on your stitches.  You can use the ointment in your eyes if they feel scratchy or irritated.  If you eyelid(s) don't close completely when you sleep, put some ointment in your eyes before bedtime.  EMERGENCY: If you experience SEVERE EYE PAIN OR HEADACHE UNRELIEVED BY TYLENOL OR PERCOCET, NAUSEA OR VOMITING, WORSENING REDNESS, OR WORSENING VISION (ESPECIALLY VISION THAT WAS INITIALLY BETTER) CALL [phone] OR [phone] DURING BUSINESS HOURS OR AFTER HOURS.  General Anesthesia, Adult, Care After This sheet gives you information about how to care for yourself after your procedure. Your health care provider may also give you more specific instructions. If you have problems or questions, contact your health care provider. What can I expect after the procedure? After the procedure, the following side effects are common:  Pain or discomfort at the IV site.  Nausea.  Vomiting.  Sore throat.  Trouble concentrating.    Feeling cold or chills.  Weak or tired.  Sleepiness and fatigue.  Soreness and body aches. These side effects can affect parts of the body that were not involved in surgery. Follow these instructions at home:  For at least 24 hours after the procedure:  Have a responsible adult stay with you. It is important to have someone help care for you until you are awake and alert.  Rest as needed.  Do not: ? Participate in activities in which you could fall or become injured. ? Drive. ? Use  heavy machinery. ? Drink alcohol. ? Take sleeping pills or medicines that cause drowsiness. ? Make important decisions or sign legal documents. ? Take care of children on your own. Eating and drinking  Follow any instructions from your health care provider about eating or drinking restrictions.  When you feel hungry, start by eating small amounts of foods that are soft and easy to digest (bland), such as toast. Gradually return to your regular diet.  Drink enough fluid to keep your urine pale yellow.  If you vomit, rehydrate by drinking water, juice, or clear broth. General instructions  If you have sleep apnea, surgery and certain medicines can increase your risk for breathing problems. Follow instructions from your health care provider about wearing your sleep device: ? Anytime you are sleeping, including during daytime naps. ? While taking prescription pain medicines, sleeping medicines, or medicines that make you drowsy.  Return to your normal activities as told by your health care provider. Ask your health care provider what activities are safe for you.  Take over-the-counter and prescription medicines only as told by your health care provider.  If you smoke, do not smoke without supervision.  Keep all follow-up visits as told by your health care provider. This is important. Contact a health care provider if:  You have nausea or vomiting that does not get better with medicine.  You cannot eat or drink without vomiting.  You have pain that does not get better with medicine.  You are unable to pass urine.  You develop a skin rash.  You have a fever.  You have redness around your IV site that gets worse. Get help right away if:  You have difficulty breathing.  You have chest pain.  You have blood in your urine or stool, or you vomit blood. Summary  After the procedure, it is common to have a sore throat or nausea. It is also common to feel tired.  Have a  responsible adult stay with you for the first 24 hours after general anesthesia. It is important to have someone help care for you until you are awake and alert.  When you feel hungry, start by eating small amounts of foods that are soft and easy to digest (bland), such as toast. Gradually return to your regular diet.  Drink enough fluid to keep your urine pale yellow.  Return to your normal activities as told by your health care provider. Ask your health care provider what activities are safe for you. This information is not intended to replace advice given to you by your health care provider. Make sure you discuss any questions you have with your health care provider. Document Revised: 04/20/2017 Document Reviewed: 12/01/2016 Elsevier Patient Education  2020 [employer]vier Inc.  

## 2019-08-13 NOTE — Telephone Encounter (Signed)
Sounds like she has held eliquis Should be acceptable risk, Has not had much atrial fib we are aware of Would restart eliquis as soon as possible under the direction of the surgeon

## 2019-08-14 NOTE — Telephone Encounter (Signed)
Sw/ the pt who is aware of the Eliquis recommendations and thanked me for the call. Pt did also say to that her cell phone does not always record the message if you try to leave a message, so she said to just keep trying her or we can always leave a message on her home number. Pt thanked me for the call. I will remove from the pre op call back pool.

## 2019-08-15 ENCOUNTER — Other Ambulatory Visit: Payer: Self-pay

## 2019-08-15 ENCOUNTER — Encounter: Payer: Self-pay | Admitting: Ophthalmology

## 2019-08-15 ENCOUNTER — Ambulatory Visit: Payer: Medicare Other | Admitting: Anesthesiology

## 2019-08-15 ENCOUNTER — Ambulatory Visit
Admission: RE | Admit: 2019-08-15 | Discharge: 2019-08-15 | Disposition: A | Discharge status: Home / Self Care | Payer: Medicare Other | Attending: Ophthalmology | Admitting: Ophthalmology

## 2019-08-15 ENCOUNTER — Encounter
Admission: RE | Disposition: A | Discharge status: Home / Self Care | Payer: Self-pay | Source: Home / Self Care | Attending: Ophthalmology

## 2019-08-15 DIAGNOSIS — Z6837 Body mass index (BMI) 37.0-37.9, adult: Secondary | ICD-10-CM | POA: Insufficient documentation

## 2019-08-15 DIAGNOSIS — Z7982 Long term (current) use of aspirin: Secondary | ICD-10-CM | POA: Insufficient documentation

## 2019-08-15 DIAGNOSIS — H02831 Dermatochalasis of right upper eyelid: Secondary | ICD-10-CM | POA: Insufficient documentation

## 2019-08-15 DIAGNOSIS — H02834 Dermatochalasis of left upper eyelid: Secondary | ICD-10-CM | POA: Diagnosis not present

## 2019-08-15 DIAGNOSIS — I2510 Atherosclerotic heart disease of native coronary artery without angina pectoris: Secondary | ICD-10-CM | POA: Insufficient documentation

## 2019-08-15 DIAGNOSIS — E785 Hyperlipidemia, unspecified: Secondary | ICD-10-CM | POA: Diagnosis not present

## 2019-08-15 DIAGNOSIS — G4730 Sleep apnea, unspecified: Secondary | ICD-10-CM | POA: Diagnosis not present

## 2019-08-15 DIAGNOSIS — E669 Obesity, unspecified: Secondary | ICD-10-CM | POA: Insufficient documentation

## 2019-08-15 DIAGNOSIS — Z955 Presence of coronary angioplasty implant and graft: Secondary | ICD-10-CM | POA: Insufficient documentation

## 2019-08-15 DIAGNOSIS — H02403 Unspecified ptosis of bilateral eyelids: Secondary | ICD-10-CM | POA: Diagnosis not present

## 2019-08-15 DIAGNOSIS — I10 Essential (primary) hypertension: Secondary | ICD-10-CM | POA: Insufficient documentation

## 2019-08-15 DIAGNOSIS — Z79899 Other long term (current) drug therapy: Secondary | ICD-10-CM | POA: Diagnosis not present

## 2019-08-15 DIAGNOSIS — E7800 Pure hypercholesterolemia, unspecified: Secondary | ICD-10-CM | POA: Diagnosis not present

## 2019-08-15 DIAGNOSIS — Z7901 Long term (current) use of anticoagulants: Secondary | ICD-10-CM | POA: Diagnosis not present

## 2019-08-15 DIAGNOSIS — I252 Old myocardial infarction: Secondary | ICD-10-CM | POA: Diagnosis not present

## 2019-08-15 DIAGNOSIS — I4891 Unspecified atrial fibrillation: Secondary | ICD-10-CM | POA: Insufficient documentation

## 2019-08-15 DIAGNOSIS — K219 Gastro-esophageal reflux disease without esophagitis: Secondary | ICD-10-CM | POA: Insufficient documentation

## 2019-08-15 DIAGNOSIS — E119 Type 2 diabetes mellitus without complications: Secondary | ICD-10-CM | POA: Insufficient documentation

## 2019-08-15 DIAGNOSIS — Z7951 Long term (current) use of inhaled steroids: Secondary | ICD-10-CM | POA: Insufficient documentation

## 2019-08-15 HISTORY — DX: Dyspnea, unspecified: R06.00

## 2019-08-15 HISTORY — DX: Other diseases of vocal cords: J38.3

## 2019-08-15 HISTORY — PX: BROW LIFT: SHX178

## 2019-08-15 HISTORY — DX: Family history of other specified conditions: Z84.89

## 2019-08-15 HISTORY — DX: Gastro-esophageal reflux disease without esophagitis: K21.9

## 2019-08-15 SURGERY — BLEPHAROPLASTY
Anesthesia: General | Site: Eye | Laterality: Bilateral

## 2019-08-15 MED ORDER — ACETAMINOPHEN 160 MG/5ML PO SOLN: 325.0000 mg | ORAL | Status: DC | PRN

## 2019-08-15 MED ORDER — PROPOFOL 500 MG/50ML IV EMUL
INTRAVENOUS | Status: DC | PRN
  Administered 2019-08-15: 25 ug/kg/min via INTRAVENOUS

## 2019-08-15 MED ORDER — TETRACAINE HCL 0.5 % OP SOLN
OPHTHALMIC | Status: DC | PRN
  Administered 2019-08-15: 1 [drp] via OPHTHALMIC

## 2019-08-15 MED ORDER — ACETAMINOPHEN 325 MG PO TABS: 325.0000 mg | ORAL_TABLET | ORAL | Status: DC | PRN

## 2019-08-15 MED ORDER — TRAMADOL HCL 50 MG PO TABS: ORAL_TABLET | ORAL | 0 refills | Status: DC

## 2019-08-15 MED ORDER — ALFENTANIL 500 MCG/ML IJ INJ
INJECTION | INTRAVENOUS | Status: DC | PRN
  Administered 2019-08-15 (×2): 250 ug via INTRAVENOUS

## 2019-08-15 MED ORDER — LIDOCAINE HCL (CARDIAC) PF 100 MG/5ML IV SOSY
PREFILLED_SYRINGE | INTRAVENOUS | Status: DC | PRN
  Administered 2019-08-15: 40 mg via INTRAVENOUS

## 2019-08-15 MED ORDER — LACTATED RINGERS IV SOLN: INTRAVENOUS | Status: DC

## 2019-08-15 MED ORDER — ONDANSETRON HCL 4 MG/2ML IJ SOLN
4.0000 mg | Freq: Once | INTRAMUSCULAR | Status: AC | PRN
  Administered 2019-08-15: 11:00:00 4 mg via INTRAVENOUS

## 2019-08-15 MED ORDER — BSS IO SOLN
INTRAOCULAR | Status: DC | PRN
  Administered 2019-08-15: 15 mL via INTRAOCULAR

## 2019-08-15 MED ORDER — HYDRALAZINE HCL 20 MG/ML IJ SOLN
10.0000 mg | Freq: Once | INTRAMUSCULAR | Status: AC
  Administered 2019-08-15: 10 mg via INTRAVENOUS

## 2019-08-15 MED ORDER — LIDOCAINE-EPINEPHRINE 2 %-1:100000 IJ SOLN
INTRAMUSCULAR | Status: DC | PRN
  Administered 2019-08-15: 3 mL via OPHTHALMIC
  Administered 2019-08-15: .5 mL via OPHTHALMIC

## 2019-08-15 MED ORDER — ERYTHROMYCIN 5 MG/GM OP OINT: TOPICAL_OINTMENT | OPHTHALMIC | 2 refills | Status: DC

## 2019-08-15 MED ORDER — MIDAZOLAM HCL 2 MG/2ML IJ SOLN
INTRAMUSCULAR | Status: DC | PRN
  Administered 2019-08-15: .5 mg via INTRAVENOUS

## 2019-08-15 MED ORDER — ERYTHROMYCIN 5 MG/GM OP OINT
TOPICAL_OINTMENT | OPHTHALMIC | Status: DC | PRN
  Administered 2019-08-15: 1 via OPHTHALMIC

## 2019-08-15 SURGICAL SUPPLY — 24 items
APPLICATOR COTTON TIP WD 3 STR (MISCELLANEOUS) ×4 IMPLANT
BLADE SURG 15 STRL LF DISP TIS (BLADE) ×1 IMPLANT
BLADE SURG 15 STRL SS (BLADE) ×1
CORD BIP STRL DISP 12FT (MISCELLANEOUS) ×2 IMPLANT
DRAPE HEAD BAR (DRAPES) ×2 IMPLANT
GAUZE SPONGE 4X4 12PLY STRL (GAUZE/BANDAGES/DRESSINGS) ×2 IMPLANT
GLOVE BIO SURGEON STRL SZ7.5 (GLOVE) ×2 IMPLANT
GLOVE SURG LX 7.0 MICRO (GLOVE) ×2
GLOVE SURG LX STRL 7.0 MICRO (GLOVE) ×2 IMPLANT
GOWN STRL REUS W/ TWL LRG LVL3 (GOWN DISPOSABLE) ×1 IMPLANT
GOWN STRL REUS W/TWL LRG LVL3 (GOWN DISPOSABLE) ×1
MARKER SKIN XFINE TIP W/RULER (MISCELLANEOUS) ×2 IMPLANT
NEEDLE FILTER BLUNT 18X 1/2SAF (NEEDLE) ×1
NEEDLE FILTER BLUNT 18X1 1/2 (NEEDLE) ×1 IMPLANT
NEEDLE HYPO 30X.5 LL (NEEDLE) ×4 IMPLANT
PACK ENT CUSTOM (PACKS) ×2 IMPLANT
SOL PREP PVP 2OZ (MISCELLANEOUS) ×2
SOLUTION PREP PVP 2OZ (MISCELLANEOUS) ×1 IMPLANT
SPONGE GAUZE 2X2 8PLY STRL LF (GAUZE/BANDAGES/DRESSINGS) ×20 IMPLANT
SUT GUT PLAIN 6-0 1X18 ABS (SUTURE) ×2 IMPLANT
SUT PROLENE 6 0 P 1 18 (SUTURE) ×4 IMPLANT
SYR 10ML LL (SYRINGE) ×2 IMPLANT
SYR 3ML LL SCALE MARK (SYRINGE) ×2 IMPLANT
WATER STERILE IRR 250ML POUR (IV SOLUTION) ×2 IMPLANT

## 2019-08-15 NOTE — Anesthesia Postprocedure Evaluation (Signed)
Anesthesia Post Note  Patient: April [name]  Procedure(s) Performed: BLEPHAROPLASTY UPPER EYELID; W/EXCESS SKIN BLEPHAROPTOSIS REPAIR; RESECT EX (Bilateral Eye)     Patient location during evaluation: PACU Anesthesia Type: General Level of consciousness: awake Pain management: pain level controlled Vital Signs Assessment: post-procedure vital signs reviewed and stable Respiratory status: respiratory function stable Cardiovascular status: stable Postop Assessment: no signs of nausea or vomiting Anesthetic complications: no    Veda Canning

## 2019-08-15 NOTE — Interval H&P Note (Signed)
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  The various methods of treatment have been discussed with the patient and family. After consideration of risks, benefits and other options for treatment, the patient has consented to  Procedure(s) with comments: BLEPHAROPLASTY UPPER EYELID; W/EXCESS SKIN BLEPHAROPTOSIS REPAIR; RESECT EX (Bilateral) - sleep apnea as a surgical intervention.  The patient's history has been reviewed, patient examined, no change in status, stable for surgery.  I have reviewed the patient's chart and labs.  Questions were answered to the patient's satisfaction.     Vickki Muff, Payal Stanforth M

## 2019-08-15 NOTE — Op Note (Signed)
Preoperative Diagnosis:  1. Visually significant blepharoptosis bilateral  Upper Eyelid(s) 2. Visually significant dermatochalasis both  Upper Eyelid(s)  Postoperative Diagnosis:  Same.  Procedure(s) Performed:   1. Blepharoptosis repair with levator aponeurosis advancement bilateral  Upper Eyelid(s) 2. Upper eyelid blepharoplasty with excess skin excision  bilateral  Upper Eyelid(s)  Surgeon: Philis Pique. Vickki Muff, M.D.  Assistants: none  Anesthesia: MAC  Specimens: None.  Estimated Blood Loss: Minimal.  Complications: None.  Operative Findings: None Dictated  Procedure:   Allergies were reviewed and the patient Penicillins and Sulfa antibiotics.   After the risks, benefits, complications and alternatives were discussed with the patient, appropriate informed consent was obtained.  While seated in an upright position and looking in primary gaze, the mid pupillary line was marked on the upper eyelid margins bilaterally. The patient was then brought to the operating suite and reclined supine.  Timeout was conducted and the patient was sedated.  Local anesthetic consisting of a 50-50 mixture of 2% lidocaine with epinephrine and 0.75% bupivacaine with added Hylenex was injected subcutaneously to both  upper eyelid(s). After adequate local was instilled, the patient was prepped and draped in the usual sterile fashion for eyelid surgery.   Attention was turned to the upper eyelids. A 53m upper eyelid crease incision line was marked with calipers on both  upper eyelid(s).  A pinch test was used to estimate the amount of excess skin to remove and this was marked in standard blepharoplasty style fashion. Attention was turned to the  right  upper eyelid. A #15 blade was used to open the premarked incision line. A Skin and muscle flap was excised and hemostasis was obtained with bipolar cautery.   A buttonhole was created medially in orbicularis and orbital septum to reveal the medial fat pocket.  This was dissected free from fascial attachments, cauterized towards the pedicle base and excised to produce a nice flattening of the medial corner of the upper eyelid.  Westcott scissors were then used to transect through orbicularis down to the tarsal plate. Epitarsus was dissected to create a smooth surface to suture to. Dissection was then carried superiorly in the plane between orbicularis and orbital septum. Once the preaponeurotic fat pocket was identified, the orbital septum was opened. This revealed the levator and its aponeurosis.    Attention was then turned to the opposite eyelid where the same procedure was performed in the same manner. Hemostasis was obtained with bipolar cautery throughout.   3 interrupted 6-0 Prolene sutures were then passed partial thickness through the tarsal plates of both  upper eyelid(s). These sutures were placed in line with the mid pupillary, medial limbal, and lateral limbal lines. The sutures were fixed to the levator aponeurosis and adjusted until a nice lid height and contour were achieved. Once nice symmetry was achieved, the skin incisions were closed with a running 6-0 plain gut suture. The patient tolerated the procedure well.  Erythromycin ophthalmic ophthalmic ointment was applied to the incision site(s) followed by ice packs. The patient was taken to the recovery area where she recovered without difficulty.  Post-Op Plan/Instructions:   The patient was instructed to use ice packs frequently for the next 48 hours. She was instructed to use Erythromycin ophthalmic ophthalmic ointment on her incisions 4 times a day for the next 12 to 14 days. Shewas given a prescription for tramadol (or similar) for pain control should Tylenol not be effective. [facility]  [place] [facility]3 [facility]' time or sooner as needed for problems.  Alanna Storti M. Vickki Muff, M.D. Ophthalmology

## 2019-08-15 NOTE — Anesthesia Preprocedure Evaluation (Addendum)
Anesthesia Evaluation  Patient identified by MRN, date of birth, ID band Patient awake    Airway Mallampati: II  TM Distance: >3 FB     Dental   Pulmonary sleep apnea ,    breath sounds clear to auscultation       Cardiovascular hypertension, + CAD, + Past MI, + Cardiac Stents (2017, patent on cath in 2019) and + DOE  + dysrhythmias Atrial Fibrillation  Rhythm:Regular Rate:Normal  HLD   Neuro/Psych  Headaches,    GI/Hepatic hiatal hernia, GERD  ,  Endo/Other  diabetes, Type 2Obesity  Renal/GU Renal InsufficiencyRenal disease     Musculoskeletal   Abdominal   Peds  Hematology   Anesthesia Other Findings   Reproductive/Obstetrics                           Anesthesia Physical Anesthesia Plan  ASA: III  Anesthesia Plan: General   Post-op Pain Management:    Induction: Intravenous  PONV Risk Score and Plan: 3 and Propofol infusion, TIVA and Treatment may vary due to age or medical condition  Airway Management Planned: Natural Airway and Nasal Cannula  Additional Equipment:   Intra-op Plan:   Post-operative Plan:   Informed Consent: I have reviewed the patients History and Physical, chart, labs and discussed the procedure including the risks, benefits and alternatives for the proposed anesthesia with the patient or authorized representative who has indicated his/her understanding and acceptance.       Plan Discussed with: CRNA  Anesthesia Plan Comments:         Anesthesia Quick Evaluation

## 2019-08-15 NOTE — Transfer of Care (Signed)
Immediate Anesthesia Transfer of Care Note  Patient: April [name]  Procedure(s) Performed: BLEPHAROPLASTY UPPER EYELID; W/EXCESS SKIN BLEPHAROPTOSIS REPAIR; RESECT EX (Bilateral Eye)  Patient Location: PACU  Anesthesia Type: General  Level of Consciousness: awake, alert  and patient cooperative  Airway and Oxygen Therapy: Patient Spontanous Breathing and Patient connected to supplemental oxygen  Post-op Assessment: Post-op Vital signs reviewed, Patient's Cardiovascular Status Stable, Respiratory Function Stable, Patent Airway and No signs of Nausea or vomiting  Post-op Vital Signs: Reviewed and stable  Complications: No apparent anesthesia complications

## 2019-08-15 NOTE — H&P (Signed)
See the history and physical completed at [facility] on 07/30/19 and scanned into the chart.   

## 2019-08-18 ENCOUNTER — Encounter: Payer: Self-pay | Admitting: *Deleted

## 2019-09-02 NOTE — OR Nursing (Signed)
Updated start time to match anesthesia induction time. 

## 2019-09-04 ENCOUNTER — Other Ambulatory Visit: Payer: Self-pay | Admitting: Cardiovascular Disease

## 2019-09-04 ENCOUNTER — Other Ambulatory Visit: Payer: Self-pay | Admitting: *Deleted

## 2019-09-04 MED ORDER — APIXABAN 5 MG PO TABS: 5.0000 mg | ORAL_TABLET | Freq: Two times a day (BID) | ORAL | 0 refills | Status: DC

## 2019-09-04 NOTE — Telephone Encounter (Signed)
Please disregard Triage fwd.

## 2019-09-04 NOTE — Telephone Encounter (Signed)
Please review for refill sent in for Eliquis that has been cancelled due to error.

## 2019-09-05 ENCOUNTER — Other Ambulatory Visit: Payer: Self-pay | Admitting: Internal Medicine

## 2019-09-05 ENCOUNTER — Other Ambulatory Visit: Payer: Self-pay

## 2019-09-05 ENCOUNTER — Other Ambulatory Visit: Payer: Self-pay | Admitting: Cardiovascular Disease

## 2019-09-05 MED ORDER — APIXABAN 5 MG PO TABS: 5.0000 mg | ORAL_TABLET | Freq: Two times a day (BID) | ORAL | 0 refills | Status: DC

## 2019-09-05 NOTE — Telephone Encounter (Signed)
Last OV 06/23/19.  Age 79, Wt 87.5kg, Scr 1.17.

## 2019-09-05 NOTE — Telephone Encounter (Signed)
*  STAT* If patient is at the pharmacy, call can be transferred to refill team.   1. Which medications need to be refilled? (please list name of each medication and dose if known) Eliquis  2. Which pharmacy/location (including street and city if local pharmacy) is medication to be sent to? [facility]  3. Do they need a 30 day or 90 day supply? [place]

## 2019-09-05 NOTE — Telephone Encounter (Signed)
Please review for refill. Thanks!  

## 2019-09-05 NOTE — Telephone Encounter (Signed)
Please review for refill request 

## 2019-10-06 ENCOUNTER — Telehealth: Payer: Self-pay | Admitting: Internal Medicine

## 2019-10-06 DIAGNOSIS — I10 Essential (primary) hypertension: Secondary | ICD-10-CM

## 2019-10-06 DIAGNOSIS — E7800 Pure hypercholesterolemia, unspecified: Secondary | ICD-10-CM

## 2019-10-06 DIAGNOSIS — E1159 Type 2 diabetes mellitus with other circulatory complications: Secondary | ICD-10-CM

## 2019-10-06 MED ORDER — APIXABAN 5 MG PO TABS: 5.0000 mg | ORAL_TABLET | Freq: Two times a day (BID) | ORAL | 1 refills | Status: DC

## 2019-10-06 NOTE — Telephone Encounter (Signed)
Pt needs a refill on Elliquis. She called Dr. Rockey Situ but he would like Dr. Nicki Reaper to take over prescribing medication. Pt only has 3 pills left.

## 2019-10-06 NOTE — Telephone Encounter (Signed)
Ok to refill please advise 

## 2019-10-06 NOTE — Telephone Encounter (Signed)
rx sent in for eliquis.  Keep f/u appt.

## 2019-10-07 NOTE — Telephone Encounter (Signed)
Follow up scheduled with Dr Nicki Reaper and fasting labs. Labs ordered.

## 2019-10-07 NOTE — Addendum Note (Signed)
Addended by: Lars Masson on: 10/07/2019 01:43 PM   Modules accepted: Orders

## 2019-10-23 ENCOUNTER — Other Ambulatory Visit: Payer: Self-pay | Admitting: Internal Medicine

## 2019-10-23 DIAGNOSIS — Z1231 Encounter for screening mammogram for malignant neoplasm of breast: Secondary | ICD-10-CM

## 2019-11-04 ENCOUNTER — Other Ambulatory Visit (INDEPENDENT_AMBULATORY_CARE_PROVIDER_SITE_OTHER): Payer: Medicare Other

## 2019-11-04 ENCOUNTER — Other Ambulatory Visit: Payer: Self-pay

## 2019-11-04 DIAGNOSIS — E7800 Pure hypercholesterolemia, unspecified: Secondary | ICD-10-CM | POA: Diagnosis not present

## 2019-11-04 DIAGNOSIS — E1159 Type 2 diabetes mellitus with other circulatory complications: Secondary | ICD-10-CM | POA: Diagnosis not present

## 2019-11-04 DIAGNOSIS — I10 Essential (primary) hypertension: Secondary | ICD-10-CM

## 2019-11-04 LAB — HEPATIC FUNCTION PANEL
ALT: 25 U/L (ref 0–35)
AST: 35 U/L (ref 0–37)
Albumin: 3.8 g/dL (ref 3.5–5.2)
Alkaline Phosphatase: 67 U/L (ref 39–117)
Bilirubin, Direct: 0.2 mg/dL (ref 0.0–0.3)
Total Bilirubin: 0.8 mg/dL (ref 0.2–1.2)
Total Protein: 5.8 g/dL — ABNORMAL LOW (ref 6.0–8.3)

## 2019-11-04 LAB — CBC WITH DIFFERENTIAL/PLATELET
Basophils Absolute: 0 10*3/uL (ref 0.0–0.1)
Basophils Relative: 0.5 % (ref 0.0–3.0)
Eosinophils Absolute: 0.1 10*3/uL (ref 0.0–0.7)
Eosinophils Relative: 2.7 % (ref 0.0–5.0)
HCT: 37.5 % (ref 36.0–46.0)
Hemoglobin: 12.4 g/dL (ref 12.0–15.0)
Lymphocytes Relative: 32.7 % (ref 12.0–46.0)
Lymphs Abs: 1.8 10*3/uL (ref 0.7–4.0)
MCHC: 33.1 g/dL (ref 30.0–36.0)
MCV: 96.4 fl (ref 78.0–100.0)
Monocytes Absolute: 0.8 10*3/uL (ref 0.1–1.0)
Monocytes Relative: 14 % — ABNORMAL HIGH (ref 3.0–12.0)
Neutro Abs: 2.7 10*3/uL (ref 1.4–7.7)
Neutrophils Relative %: 50.1 % (ref 43.0–77.0)
Platelets: 227 10*3/uL (ref 150.0–400.0)
RBC: 3.89 Mil/uL (ref 3.87–5.11)
RDW: 13.7 % (ref 11.5–15.5)
WBC: 5.5 10*3/uL (ref 4.0–10.5)

## 2019-11-04 LAB — BASIC METABOLIC PANEL
BUN: 20 mg/dL (ref 6–23)
CO2: 30 mEq/L (ref 19–32)
Calcium: 9.2 mg/dL (ref 8.4–10.5)
Chloride: 104 mEq/L (ref 96–112)
Creatinine, Ser: 1.11 mg/dL (ref 0.40–1.20)
GFR: 47.17 mL/min — ABNORMAL LOW (ref >60.00)
Glucose, Bld: 111 mg/dL — ABNORMAL HIGH (ref 70–99)
Potassium: 4.5 mEq/L (ref 3.5–5.1)
Sodium: 143 mEq/L (ref 135–145)

## 2019-11-04 LAB — LIPID PANEL
Cholesterol: 153 mg/dL (ref 0–200)
HDL: 62.6 mg/dL (ref >39.00)
LDL Cholesterol: 72 mg/dL (ref 0–99)
NonHDL: 90.52
Total CHOL/HDL Ratio: 2
Triglycerides: 95 mg/dL (ref 0.0–149.0)
VLDL: 19 mg/dL (ref 0.0–40.0)

## 2019-11-04 LAB — TSH: TSH: 3.4 u[IU]/mL (ref 0.35–4.50)

## 2019-11-04 LAB — HEMOGLOBIN A1C: Hgb A1c MFr Bld: 6.2 % (ref 4.6–6.5)

## 2019-11-05 ENCOUNTER — Ambulatory Visit
Admission: RE | Admit: 2019-11-05 | Discharge: 2019-11-05 | Disposition: A | Discharge status: Home / Self Care | Payer: Medicare Other | Source: Ambulatory Visit | Attending: Internal Medicine | Admitting: Internal Medicine

## 2019-11-05 DIAGNOSIS — Z1231 Encounter for screening mammogram for malignant neoplasm of breast: Secondary | ICD-10-CM | POA: Insufficient documentation

## 2019-11-06 ENCOUNTER — Ambulatory Visit (INDEPENDENT_AMBULATORY_CARE_PROVIDER_SITE_OTHER): Payer: Medicare Other | Admitting: Internal Medicine

## 2019-11-06 ENCOUNTER — Other Ambulatory Visit: Payer: Self-pay

## 2019-11-06 ENCOUNTER — Encounter: Payer: Self-pay | Admitting: Internal Medicine

## 2019-11-06 DIAGNOSIS — R0602 Shortness of breath: Secondary | ICD-10-CM

## 2019-11-06 DIAGNOSIS — I25118 Atherosclerotic heart disease of native coronary artery with other forms of angina pectoris: Secondary | ICD-10-CM | POA: Diagnosis not present

## 2019-11-06 DIAGNOSIS — E1159 Type 2 diabetes mellitus with other circulatory complications: Secondary | ICD-10-CM

## 2019-11-06 DIAGNOSIS — G4733 Obstructive sleep apnea (adult) (pediatric): Secondary | ICD-10-CM

## 2019-11-06 DIAGNOSIS — E7800 Pure hypercholesterolemia, unspecified: Secondary | ICD-10-CM

## 2019-11-06 DIAGNOSIS — N183 Chronic kidney disease, stage 3 unspecified: Secondary | ICD-10-CM

## 2019-11-06 DIAGNOSIS — I480 Paroxysmal atrial fibrillation: Secondary | ICD-10-CM

## 2019-11-06 DIAGNOSIS — I10 Essential (primary) hypertension: Secondary | ICD-10-CM

## 2019-11-06 DIAGNOSIS — R600 Localized edema: Secondary | ICD-10-CM

## 2019-11-06 NOTE — Progress Notes (Signed)
Patient ID: April [name], female   DOB: [birth year], 79 y.o.   MRN: [id number]   Subjective:    Patient ID: April [name], female    DOB: March 17, 1941, 79 y.o.   MRN: [id number]  HPI This visit occurred during the SARS-CoV-2 public health emergency.  Safety protocols were in place, including screening questions prior to the visit, additional usage of staff PPE, and extensive cleaning of exam room while observing appropriate contact time as indicated for disinfecting solutions.  Patient here for a scheduled follow up.  She reports increased lower extremity swelling.  Has been a persistent intermittent problem for her.  Increased recently.  States is starting to improve.  Saw cardiology 06/23/19.  Felt stable.  No changes made.  Denies chest pain.  Feels breathing is stable.  Was seeing Dr Mortimer Fries.  Stopped Breo.  Uses her rescue inhaler - scheduled.  Has had issues with her voice trembling.  Muscles tight around vocal cords.  Seeing Dr Haynes Hoehn.  Recommended speech therapy.  GI - cologuard.  Acid reflux controlled.  No abdominal pain.  Bowels stable.     Past Medical History:  Diagnosis Date  . CAD S/P percutaneous coronary angioplasty    a. 07/2015 MV: No ischemia, EF 66%;  b. 03/2016 Inflat STEMI/PCI: LM nl, LAD 25d, RI nl, LCX nl, OM1/2 nl, RCA 80p (3.5x16 Promus Premier DES), 50p/m, 30d, RPDA 90 (2.5x12 Promu Premier DES); c. 01/2018 Cath ([place], [place]): Patent RCA stents->Med Rx.  . CKD (chronic kidney disease), stage III   . Diastolic dysfunction    a. 10/2013 Echo: EF 55-65%, Gr1 DD; b. 01/2018 Echo ([place], [place]): EF 55-60%, Gr2 DD, RVSP 70mHg.  . Diverticulitis   . Dyspnea   . Endometriosis   . Family history of adverse reaction to anesthesia    Mother - had to stay in ICU because of breathing difficulties every time she had anesthesia  . GERD (gastroesophageal reflux disease)   . Hiatal hernia   . History of colon polyps 08/1994  . History of Migraines   .  Hypercholesterolemia   . Hypertension   . LBBB (left bundle branch block)   . Osteoporosis   . PAF (paroxysmal atrial fibrillation) ([place]    a. 03/2016 Dx @ time of MI; b. CHA2DS2VASc = 5-->Eliquis.  . Rheumatic fever   . Sleep apnea    CPAP  . Spasmodic dysphonia   . ST elevation myocardial infarction (STEMI) of inferolateral wall, [place] 03/25/2016  . Urine incontinence    Past Surgical History:  Procedure Laterality Date  . ABDOMINAL HYSTERECTOMY     ovaries not removed  . APPENDECTOMY     was removed during hysterectomy  . Breast biopsies     x2  . BREAST EXCISIONAL BIOPSY Bilateral "years ago"   neg  . BREAST SURGERY Bilateral   . BROW LIFT Bilateral 08/15/2019   Procedure: BLEPHAROPLASTY UPPER EYELID; W/EXCESS SKIN BLEPHAROPTOSIS REPAIR; RESECT EX;  Surgeon: FKarle Starch MD;  [facility]  Service: Ophthalmology;  Laterality: Bilateral;  sleep apnea  . CARDIAC CATHETERIZATION  2011   moderate 40% RCA disease  . CARDIAC CATHETERIZATION  01/2010   Dr. GSuzie Portela: Only noted 40% RCA  . CARDIAC CATHETERIZATION N/A 03/25/2016   Procedure: Left Heart Cath and Coronary Angiography;  Surgeon: DLeonie Man MD;  [facility];  Service: Cardiovascular;  Laterality: N/A;  . CARDIAC CATHETERIZATION N/A 03/25/2016  Procedure: Coronary Stent Intervention;  Surgeon: Leonie Man, MD;  Location: [facility] [facility];  Service: Cardiovascular;  Laterality: N/A;  . COLONOSCOPY  2013  . ESOPHAGOGASTRODUODENOSCOPY (EGD) WITH PROPOFOL N/A 03/17/2015   Procedure: ESOPHAGOGASTRODUODENOSCOPY (EGD) WITH PROPOFOL;  Surgeon: Christene Lye, MD;  Location: [facility];  Service: Gastroenterology;  Laterality: N/A;  . NM MYOVIEW ([place] H[facility])  07/2015   No evidence ischemia or infarction. EF 66%. Low risk  . TONSILLECTOMY    . TRANSTHORACIC ECHOCARDIOGRAM  10/2013   Normal LV size and function. EF 55-65%. GR 1 DD. Otherwise  normal.   Family History  Problem Relation Age of Onset  . Arthritis Mother   . Stroke Mother   . Hypertension Mother   . Osteoporosis Mother   . Heart disease Father        MI  . Heart attack Father   . Stroke Brother   . Heart attack Sister   . Breast cancer Other        first cousin x 2  . Stroke Daughter   . Heart attack Daughter   . Colon cancer Neg Hx    Social History   Socioeconomic History  . Marital status: Widowed    Spouse name: Not on file  . Number of children: 2  . Years of education: Not on file  . Highest education level: Not on file  Occupational History  . Not on file  Tobacco Use  . Smoking status: Never Smoker  . Smokeless tobacco: Never Used  Vaping Use  . Vaping Use: Never used  Substance and Sexual Activity  . Alcohol use: No    Alcohol/week: 0.0 standard drinks  . Drug use: No  . Sexual activity: Never  Other Topics Concern  . Not on file  Social History Narrative  . Not on file   Social Determinants of Health   Financial Resource Strain:   . Difficulty of Paying Living Expenses:   Food Insecurity:   . Worried About Charity fundraiser in the Last Year:   . Arboriculturist in the Last Year:   Transportation Needs:   . Film/video editor (Medical):   Marland Kitchen Lack of Transportation (Non-Medical):   Physical Activity:   . Days of Exercise per Week:   . Minutes of Exercise per Session:   Stress:   . Feeling of Stress :   Social Connections:   . Frequency of Communication with Friends and Family:   . Frequency of Social Gatherings with Friends and Family:   . Attends Religious Services:   . Active Member of Clubs or Organizations:   . Attends Archivist Meetings:   Marland Kitchen Marital Status:     Outpatient Encounter Medications as of 11/06/2019  Medication Sig  . acetaminophen (TYLENOL) 500 MG tablet Take 500 mg by mouth daily as needed for headache (pain).  Marland Kitchen albuterol (PROVENTIL HFA;VENTOLIN HFA) 108 (90 Base) MCG/ACT inhaler  Inhale 2 puffs into the lungs every 6 (six) hours as needed for wheezing or shortness of breath.  Marland Kitchen amiodarone (PACERONE) 200 MG tablet TAKE 1 TABLET BY MOUTH DAILY  . apixaban (ELIQUIS) 5 MG TABS tablet Take 1 tablet (5 mg total) by mouth 2 (two) times daily.  Marland Kitchen aspirin EC 81 MG tablet Take 1 tablet (81 mg total) by mouth daily.  Marland Kitchen azelastine (ASTELIN) 0.1 % nasal spray Place 2 sprays into both nostrils daily as needed for rhinitis. Use in each nostril as directed  .  cholecalciferol (VITAMIN D) 1000 units tablet Take 1,000 Units by mouth 2 (two) times daily.  Marland Kitchen erythromycin ophthalmic ointment Apply to sutures 4 times a day for 10-12 days.  Discontinue if allergy develops and call our office  . fish oil-omega-3 fatty acids 1000 MG capsule Take 1 g by mouth 2 (two) times daily.   . furosemide (LASIX) 40 MG tablet Take 1 tablet (40 mg) in the AM then take 0.5 tablet (20 mg) in the PM.  . hydrALAZINE (APRESOLINE) 10 MG tablet TAKE ONE TABLET BY MOUTH THREE TIMES A DAY  . losartan (COZAAR) 100 MG tablet TAKE 1 TABLET BY MOUTH DAILY  . Misc Natural Products (OSTEO BI-FLEX JOINT SHIELD PO) Take 1 tablet by mouth 2 (two) times daily.   . Multiple Vitamin (MULTIVITAMIN WITH MINERALS) TABS tablet Take 0.5 tablets by mouth 2 (two) times daily.  . nitroGLYCERIN (NITROSTAT) 0.4 MG SL tablet Place 1 tablet (0.4 mg total) under the tongue every 5 (five) minutes as needed.  . pantoprazole (PROTONIX) 40 MG tablet TAKE 1 TABLET BY MOUTH DAILY  . Polyethyl Glycol-Propyl Glycol (SYSTANE OP) Place 1 drop into both eyes daily as needed (dry eyes).  . potassium chloride (KLOR-CON) 10 MEQ tablet TAKE 1 TABLET BY MOUTH TWICE A DAY  . PRESCRIPTION MEDICATION Inhale into the lungs at bedtime. CPAP  . propranolol (INDERAL) 20 MG tablet TAKE ONE TABLET BY MOUTH THREE TIMES A DAY  . rosuvastatin (CRESTOR) 40 MG tablet TAKE 1 TABLET BY MOUTH DAILY  . traMADol (ULTRAM) 50 MG tablet Take 50 mg by mouth every 8 (eight) hours as  needed.  . traMADol (ULTRAM) 50 MG tablet Take 1 every 4-6 hours as needed for pain not controlled by Tylenol  . [DISCONTINUED] BREO ELLIPTA 100-25 MCG/INH AEPB INHALE 1 PUFF INTO THE LUNGS DAILY   No facility-administered encounter medications on file as of 11/06/2019.    Review of Systems  Constitutional: Negative for appetite change and unexpected weight change.  HENT: Negative for congestion and sinus pressure.   Respiratory: Negative for cough and chest tightness.        Breathing stable.   Cardiovascular: Positive for leg swelling. Negative for chest pain and palpitations.  Gastrointestinal: Negative for abdominal pain, diarrhea, nausea and vomiting.  Genitourinary: Negative for difficulty urinating and dysuria.  Musculoskeletal: Negative for joint swelling and myalgias.  Skin: Negative for color change and rash.  Neurological: Negative for dizziness, light-headedness and headaches.  Psychiatric/Behavioral: Negative for agitation and dysphoric mood.       Objective:    Physical Exam Vitals reviewed.  Constitutional:      General: She is not in acute distress.    Appearance: Normal appearance.  HENT:     Head: Normocephalic and atraumatic.     Right Ear: External ear normal.     Left Ear: External ear normal.  Eyes:     General: No scleral icterus.       Right eye: No discharge.        Left eye: No discharge.     Conjunctiva/sclera: Conjunctivae normal.  Neck:     Thyroid: No thyromegaly.  Cardiovascular:     Rate and Rhythm: Normal rate and regular rhythm.  Pulmonary:     Effort: No respiratory distress.     Breath sounds: Normal breath sounds. No wheezing.  Abdominal:     General: Bowel sounds are normal.     Palpations: Abdomen is soft.     Tenderness: There is no  abdominal tenderness.  Musculoskeletal:        General: No tenderness.     Cervical back: Neck supple. No tenderness.     Comments: Increased pedal and lower extremity swelling.    Lymphadenopathy:      Cervical: No cervical adenopathy.  Skin:    Findings: Erythema present.     Comments: Stasis changes - lower extremity.  Redness c/w stasis changes.   Neurological:     Mental Status: She is alert.  Psychiatric:        Mood and Affect: Mood normal.        Behavior: Behavior normal.     BP 132/72   Pulse 67   Temp 97.9 F (36.6 C)   Resp 16   Ht 5' (1.524 m)   Wt 198 lb (89.8 kg)   LMP 05/01/1972   SpO2 97%   BMI 38.67 kg/m  Wt Readings from Last 3 Encounters:  11/06/19 198 lb (89.8 kg)  08/15/19 193 lb (87.5 kg)  06/23/19 195 lb 8 oz (88.7 kg)     Lab Results  Component Value Date   WBC 5.5 11/04/2019   HGB 12.4 11/04/2019   HCT 37.5 11/04/2019   PLT 227.0 11/04/2019   GLUCOSE 111 (H) 11/04/2019   CHOL 153 11/04/2019   TRIG 95.0 11/04/2019   HDL 62.60 11/04/2019   LDLCALC 72 11/04/2019   ALT 25 11/04/2019   AST 35 11/04/2019   NA 143 11/04/2019   K 4.5 11/04/2019   CL 104 11/04/2019   CREATININE 1.11 11/04/2019   BUN 20 11/04/2019   CO2 30 11/04/2019   TSH 3.40 11/04/2019   INR 0.99 03/25/2016   HGBA1C 6.2 11/04/2019   MICROALBUR 0.8 01/10/2019    MM 3D SCREEN BREAST BILATERAL  Result Date: 11/06/2019 CLINICAL DATA:  Screening. EXAM: DIGITAL SCREENING BILATERAL MAMMOGRAM WITH TOMO AND CAD COMPARISON:  Previous exam(s). ACR Breast Density Category c: The breast tissue is heterogeneously dense, which may obscure small masses. FINDINGS: There are no findings suspicious for malignancy. Images were processed with CAD. IMPRESSION: No mammographic evidence of malignancy. A result letter of this screening mammogram will be mailed directly to the patient. RECOMMENDATION: Screening mammogram in one year. (Code:SM-B-01Y) BI-RADS CATEGORY  1: Negative. Electronically Signed   By: Lajean Manes M.D.   On: 11/06/2019 08:09       Assessment & Plan:   Problem List Items Addressed This Visit    CKD (chronic kidney disease), stage III    Avoid antiinflammatories.   Follow metabolic panel.       Diabetes mellitus ([place])    Low carb diet and exercise.  Follow met b and a1c.       Relevant Orders   Hemoglobin A1c   Essential hypertension, benign (Chronic)    Blood pressure as outlined.  Continue losartan.  Follow pressures.  Follow metabolic panel.       Relevant Orders   Basic metabolic panel   Hypercholesterolemia (Chronic)    On crestor.  Low cholesterol diet and exercise.  Follow lipid panel and liver function tests.        Relevant Orders   Hepatic function panel   Lipid panel   Lower extremity edema    Increased recently.  Was traveling and had not been using lymphedema pump.  Back to using now.  Swelling is improving.  Follow.  Has seen [facility].        Obstructive sleep apnea (Chronic)    CPAP.  Paroxysmal atrial fibrillation (HCC)    On eliquis.  Followed by cardiology.  Stable.        SOB (shortness of breath)    Breathing overall stable.  Followed by cardiology and has been followed by pulmonary.           Einar Pheasant, MD

## 2019-11-11 ENCOUNTER — Encounter: Payer: Self-pay | Admitting: Internal Medicine

## 2019-11-11 NOTE — Assessment & Plan Note (Signed)
Breathing overall stable.  Followed by cardiology and has been followed by pulmonary.

## 2019-11-11 NOTE — Assessment & Plan Note (Signed)
Increased recently.  Was traveling and had not been using lymphedema pump.  Back to using now.  Swelling is improving.  Follow.  Has seen [facility].

## 2019-11-11 NOTE — Assessment & Plan Note (Signed)
Blood pressure as outlined.   Continue losartan.  Follow pressures.  Follow metabolic panel.  

## 2019-11-11 NOTE — Assessment & Plan Note (Signed)
On crestor.  Low cholesterol diet and exercise.  Follow lipid panel and liver function tests.   

## 2019-11-11 NOTE — Assessment & Plan Note (Signed)
On eliquis.  Followed by cardiology.  Stable.  °

## 2019-11-11 NOTE — Assessment & Plan Note (Signed)
CPAP.  

## 2019-11-11 NOTE — Assessment & Plan Note (Signed)
Avoid antiinflammatories.  Follow metabolic panel.  

## 2019-11-11 NOTE — Assessment & Plan Note (Signed)
Low carb diet and exercise.  Follow met b and a1c.  

## 2019-11-17 ENCOUNTER — Other Ambulatory Visit: Payer: Self-pay | Admitting: Cardiovascular Disease

## 2019-11-25 ENCOUNTER — Ambulatory Visit (INDEPENDENT_AMBULATORY_CARE_PROVIDER_SITE_OTHER): Payer: Medicare Other | Admitting: Vascular Surgery

## 2019-11-27 ENCOUNTER — Other Ambulatory Visit: Payer: Self-pay

## 2019-11-27 ENCOUNTER — Encounter (INDEPENDENT_AMBULATORY_CARE_PROVIDER_SITE_OTHER): Payer: Self-pay | Admitting: Nurse Practitioner

## 2019-11-27 ENCOUNTER — Ambulatory Visit (INDEPENDENT_AMBULATORY_CARE_PROVIDER_SITE_OTHER): Payer: Medicare Other | Admitting: Nurse Practitioner

## 2019-11-27 VITALS — BP 164/81 | HR 62 | Resp 16 | Wt 190.8 lb

## 2019-11-27 DIAGNOSIS — I890 Lymphedema, not elsewhere classified: Secondary | ICD-10-CM

## 2019-11-27 DIAGNOSIS — I25118 Atherosclerotic heart disease of native coronary artery with other forms of angina pectoris: Secondary | ICD-10-CM

## 2019-11-27 DIAGNOSIS — K219 Gastro-esophageal reflux disease without esophagitis: Secondary | ICD-10-CM | POA: Diagnosis not present

## 2019-11-27 DIAGNOSIS — I10 Essential (primary) hypertension: Secondary | ICD-10-CM

## 2019-12-01 ENCOUNTER — Encounter (INDEPENDENT_AMBULATORY_CARE_PROVIDER_SITE_OTHER): Payer: Self-pay | Admitting: Nurse Practitioner

## 2019-12-01 NOTE — Progress Notes (Signed)
Subjective:    Patient ID: April [name], female    DOB: Feb 04, 1941, 79 y.o.   MRN: [id number] Chief Complaint  Patient presents with  . Follow-up    22yr follow up     The patient returns to the office for followup evaluation regarding leg swelling.  The swelling has persisted but with the lymph pump the patient states the swelling is much better controlled. The pain associated with swelling is essentially eliminated. There have not been any interval development of a ulcerations or wounds.  No episodes of cellulitis or infection over the past 12 months  The patient denies problems with the pump, noting it is working well and the leggings are in good condition.  Since the previous visit the patient has been wearing graduated compression stockings and using the lymph pump on a routine basis and  has noted significant improvement in the lymphedema.   Patient stated the lymph pump has been a very positive factor in her care.        Review of Systems  Cardiovascular: Positive for leg swelling.  All other systems reviewed and are negative.      Objective:   Physical Exam Vitals reviewed.  HENT:     Head: Normocephalic.  Cardiovascular:     Rate and Rhythm: Normal rate. Rhythm irregular.     Pulses: Normal pulses.     Heart sounds: Normal heart sounds.  Pulmonary:     Effort: Pulmonary effort is normal.  Musculoskeletal:     Right lower leg: Edema present.     Left lower leg: Edema present.  Skin:    General: Skin is warm and dry.  Neurological:     Mental Status: She is alert and oriented to person, place, and time.  Psychiatric:        Mood and Affect: Mood normal.        Behavior: Behavior normal.        Thought Content: Thought content normal.        Judgment: Judgment normal.     BP (!) 164/81 (BP Location: Left Arm)   Pulse 62   Resp 16   Wt 190 lb 12.8 oz (86.5 kg)   LMP 05/01/1972   BMI 37.26 kg/m   Past Medical History:  Diagnosis Date  . CAD S/P  percutaneous coronary angioplasty    a. 07/2015 MV: No ischemia, EF 66%;  b. 03/2016 Inflat STEMI/PCI: LM nl, LAD 25d, RI nl, LCX nl, OM1/2 nl, RCA 80p (3.5x16 Promus Premier DES), 50p/m, 30d, RPDA 90 (2.5x12 Promu Premier DES); c. 01/2018 Cath ([place], [place]): Patent RCA stents->Med Rx.  . CKD (chronic kidney disease), stage III   . Diastolic dysfunction    a. 10/2013 Echo: EF 55-65%, Gr1 DD; b. 01/2018 Echo ([place], [place]): EF 55-60%, Gr2 DD, RVSP 15mmHg.  . Diverticulitis   . Dyspnea   . Endometriosis   . Family history of adverse reaction to anesthesia    Mother - had to stay in ICU because of breathing difficulties every time she had anesthesia  . GERD (gastroesophageal reflux disease)   . Hiatal hernia   . History of colon polyps 08/1994  . History of Migraines   . Hypercholesterolemia   . Hypertension   . LBBB (left bundle branch block)   . Osteoporosis   . PAF (paroxysmal atrial fibrillation) ([place])    a. 03/2016 Dx @ time of MI; b. CHA2DS2VASc = 5-->Eliquis.  . Rheumatic  fever   . Sleep apnea    CPAP  . Spasmodic dysphonia   . ST elevation myocardial infarction (STEMI) of inferolateral wall, initial episode of care ([place]) 03/25/2016  . Urine incontinence     Social History   Socioeconomic History  . Marital status: Widowed    Spouse name: Not on file  . Number of children: 2  . Years of education: Not on file  . Highest education level: Not on file  Occupational History  . Not on file  Tobacco Use  . Smoking status: Never Smoker  . Smokeless tobacco: Never Used  Vaping Use  . Vaping Use: Never used  Substance and Sexual Activity  . Alcohol use: No    Alcohol/week: 0.0 standard drinks  . Drug use: No  . Sexual activity: Never  Other Topics Concern  . Not on file  Social History Narrative  . Not on file   Social Determinants of Health   Financial Resource Strain:   . Difficulty of Paying Living Expenses:   Food Insecurity:   .  Worried About Charity fundraiser in the Last Year:   . Arboriculturist in the Last Year:   Transportation Needs:   . Film/video editor (Medical):   Marland Kitchen Lack of Transportation (Non-Medical):   Physical Activity:   . Days of Exercise per Week:   . Minutes of Exercise per Session:   Stress:   . Feeling of Stress :   Social Connections:   . Frequency of Communication with Friends and Family:   . Frequency of Social Gatherings with Friends and Family:   . Attends Religious Services:   . Active Member of Clubs or Organizations:   . Attends Archivist Meetings:   Marland Kitchen Marital Status:   Intimate Partner Violence:   . Fear of Current or Ex-Partner:   . Emotionally Abused:   Marland Kitchen Physically Abused:   . Sexually Abused:     Past Surgical History:  Procedure Laterality Date  . ABDOMINAL HYSTERECTOMY     ovaries not removed  . APPENDECTOMY     was removed during hysterectomy  . Breast biopsies     x2  . BREAST EXCISIONAL BIOPSY Bilateral "years ago"   neg  . BREAST SURGERY Bilateral   . BROW LIFT Bilateral 08/15/2019   Procedure: BLEPHAROPLASTY UPPER EYELID; W/EXCESS SKIN BLEPHAROPTOSIS REPAIR; RESECT EX;  Surgeon: Karle Starch, MD;  Location: [facility];  Service: Ophthalmology;  Laterality: Bilateral;  sleep apnea  . CARDIAC CATHETERIZATION  2011   moderate 40% RCA disease  . CARDIAC CATHETERIZATION  01/2010   Dr. [email or web address] : Only noted 40% RCA  . CARDIAC CATHETERIZATION N/A 03/25/2016   Procedure: Left Heart Cath and Coronary Angiography;  Surgeon: 04/07/2016, MD;  Location: [facility];  Service: Cardiovascular;  Laterality: N/A;  . CARDIAC CATHETERIZATION N/A 03/25/2016   Procedure: Coronary Stent Intervention;  Surgeon: 04/07/2016, MD;  Location: [facility];  Service: Cardiovascular;  Laterality: N/A;  . COLONOSCOPY  2013  . ESOPHAGOGASTRODUODENOSCOPY (EGD) WITH PROPOFOL N/A 03/17/2015   Procedure: ESOPHAGOGASTRODUODENOSCOPY (EGD)  WITH PROPOFOL;  Surgeon: 03/30/2015, MD;  Location: [facility];  Service: Gastroenterology;  Laterality: N/A;  . NM MYOVIEW ([place] [facility])  07/2015   No evidence ischemia or infarction. EF 66%. Low risk  . TONSILLECTOMY    . TRANSTHORACIC ECHOCARDIOGRAM  10/2013   Normal LV size and function. EF 55-65%. GR 1 DD. Otherwise  normal.    Family History  Problem Relation Age of Onset  . Arthritis Mother   . Stroke Mother   . Hypertension Mother   . Osteoporosis Mother   . Heart disease Father        MI  . Heart attack Father   . Stroke Brother   . Heart attack Sister   . Breast cancer Other        first cousin x 2  . Stroke Daughter   . Heart attack Daughter   . Colon cancer Neg Hx     Allergies  Allergen Reactions  . Penicillins Other (See Comments)    Okay to take amoxicillin/(pt does not recall what the reaction to penicillin was (30-41 years old)   . Sulfa Antibiotics Rash       Assessment & Plan:   1. Lymphedema  No surgery or intervention at this point in time.    I have reviewed my discussion with the patient regarding lymphedema and why it  causes symptoms.  Patient will continue wearing graduated compression stockings class 1 (20-30 mmHg) on a daily basis a prescription was given. The patient is reminded to put the stockings on first thing in the morning and removing them in the evening. The patient is instructed specifically not to sleep in the stockings.   In addition, behavioral modification throughout the day will be continued.  This will include frequent elevation (such as in a recliner), use of over the counter pain medications as needed and exercise such as walking.  I have reviewed systemic causes for chronic edema such as liver, kidney and cardiac etiologies and there does not appear to be any significant changes in these organ systems over the past year.  The patient is under the impression that these organ systems are all stable and unchanged.     The patient will continue aggressive use of the  lymph pump.  This will continue to improve the edema control and prevent sequela such as ulcers and infections.   The patient will follow-up with me on an annual basis.    2. Essential hypertension, benign Continue antihypertensive medications as already ordered, these medications have been reviewed and there are no changes at this time.   3. Gastroesophageal reflux disease without esophagitis Continue PPI as already ordered, this medication has been reviewed and there are no changes at this time.  Avoidence of caffeine and alcohol  Moderate elevation of the head of the bed    Current Outpatient Medications on File Prior to Visit  Medication Sig Dispense Refill  . acetaminophen (TYLENOL) 500 MG tablet Take 500 mg by mouth daily as needed for headache (pain).    Marland Kitchen albuterol (PROVENTIL HFA;VENTOLIN HFA) 108 (90 Base) MCG/ACT inhaler Inhale 2 puffs into the lungs every 6 (six) hours as needed for wheezing or shortness of breath. 1 Inhaler 6  . amiodarone (PACERONE) 200 MG tablet TAKE 1 TABLET BY MOUTH DAILY 90 tablet 2  . apixaban (ELIQUIS) 5 MG TABS tablet Take 1 tablet (5 mg total) by mouth 2 (two) times daily. 180 tablet 1  . aspirin EC 81 MG tablet Take 1 tablet (81 mg total) by mouth daily. 90 tablet 3  . azelastine (ASTELIN) 0.1 % nasal spray Place 2 sprays into both nostrils daily as needed for rhinitis. Use in each nostril as directed    . cholecalciferol (VITAMIN D) 1000 units tablet Take 1,000 Units by mouth 2 (two) times daily.    Marland Kitchen erythromycin  ophthalmic ointment Apply to sutures 4 times a day for 10-12 days.  Discontinue if allergy develops and call our office 3.5 g 2  . fish oil-omega-3 fatty acids 1000 MG capsule Take 1 g by mouth 2 (two) times daily.     . furosemide (LASIX) 40 MG tablet TAKE 1 TABLET BY MOUTH IN THE MORNING AND THEN TAKE 1/2 TABLET BY MOUTH IN THEEVENING 135 tablet 1  . hydrALAZINE (APRESOLINE) 10 MG  tablet TAKE ONE TABLET BY MOUTH THREE TIMES A DAY 270 tablet 2  . losartan (COZAAR) 100 MG tablet TAKE 1 TABLET BY MOUTH DAILY 90 tablet 0  . Misc Natural Products (OSTEO BI-FLEX JOINT SHIELD PO) Take 1 tablet by mouth 2 (two) times daily.     . Multiple Vitamin (MULTIVITAMIN WITH MINERALS) TABS tablet Take 0.5 tablets by mouth 2 (two) times daily.    . nitroGLYCERIN (NITROSTAT) 0.4 MG SL tablet Place 1 tablet (0.4 mg total) under the tongue every 5 (five) minutes as needed. 25 tablet 0  . pantoprazole (PROTONIX) 40 MG tablet TAKE 1 TABLET BY MOUTH DAILY 90 tablet 2  . Polyethyl Glycol-Propyl Glycol (SYSTANE OP) Place 1 drop into both eyes daily as needed (dry eyes).    . potassium chloride (KLOR-CON) 10 MEQ tablet TAKE 1 TABLET BY MOUTH TWICE A DAY 180 tablet 2  . PRESCRIPTION MEDICATION Inhale into the lungs at bedtime. CPAP    . propranolol (INDERAL) 20 MG tablet TAKE ONE TABLET BY MOUTH THREE TIMES A DAY 270 tablet 2  . rosuvastatin (CRESTOR) 40 MG tablet TAKE 1 TABLET BY MOUTH DAILY 90 tablet 2  . traMADol (ULTRAM) 50 MG tablet Take 50 mg by mouth every 8 (eight) hours as needed.    . traMADol (ULTRAM) 50 MG tablet Take 1 every 4-6 hours as needed for pain not controlled by Tylenol 6 tablet 0   No current facility-administered medications on file prior to visit.    There are no Patient Instructions on file for this visit. No follow-ups on file.   Kris Hartmann, NP

## 2019-12-04 ENCOUNTER — Ambulatory Visit (INDEPENDENT_AMBULATORY_CARE_PROVIDER_SITE_OTHER): Payer: Medicare Other | Admitting: Nurse Practitioner

## 2019-12-18 ENCOUNTER — Other Ambulatory Visit: Payer: Self-pay | Admitting: Cardiovascular Disease

## 2020-01-20 ENCOUNTER — Other Ambulatory Visit: Payer: Self-pay | Admitting: Internal Medicine

## 2020-01-28 ENCOUNTER — Other Ambulatory Visit: Payer: Self-pay

## 2020-01-30 ENCOUNTER — Ambulatory Visit: Payer: Medicare Other

## 2020-02-16 ENCOUNTER — Other Ambulatory Visit: Payer: Self-pay

## 2020-02-16 ENCOUNTER — Ambulatory Visit (INDEPENDENT_AMBULATORY_CARE_PROVIDER_SITE_OTHER): Payer: Medicare Other

## 2020-02-16 ENCOUNTER — Other Ambulatory Visit: Payer: Self-pay | Admitting: Cardiovascular Disease

## 2020-02-16 DIAGNOSIS — Z23 Encounter for immunization: Secondary | ICD-10-CM

## 2020-03-04 ENCOUNTER — Other Ambulatory Visit (INDEPENDENT_AMBULATORY_CARE_PROVIDER_SITE_OTHER): Payer: Medicare Other

## 2020-03-04 ENCOUNTER — Other Ambulatory Visit: Payer: Self-pay

## 2020-03-04 DIAGNOSIS — E7800 Pure hypercholesterolemia, unspecified: Secondary | ICD-10-CM

## 2020-03-04 DIAGNOSIS — I10 Essential (primary) hypertension: Secondary | ICD-10-CM | POA: Diagnosis not present

## 2020-03-04 DIAGNOSIS — E1159 Type 2 diabetes mellitus with other circulatory complications: Secondary | ICD-10-CM | POA: Diagnosis not present

## 2020-03-04 LAB — BASIC METABOLIC PANEL
BUN: 19 mg/dL (ref 6–23)
CO2: 32 mEq/L (ref 19–32)
Calcium: 9.1 mg/dL (ref 8.4–10.5)
Chloride: 105 mEq/L (ref 96–112)
Creatinine, Ser: 1.21 mg/dL — ABNORMAL HIGH (ref 0.40–1.20)
GFR: 42.08 mL/min — ABNORMAL LOW (ref >60.00)
Glucose, Bld: 104 mg/dL — ABNORMAL HIGH (ref 70–99)
Potassium: 4.8 mEq/L (ref 3.5–5.1)
Sodium: 142 mEq/L (ref 135–145)

## 2020-03-04 LAB — LIPID PANEL
Cholesterol: 147 mg/dL (ref 0–200)
HDL: 67.8 mg/dL (ref >39.00)
LDL Cholesterol: 64 mg/dL (ref 0–99)
NonHDL: 79.07
Total CHOL/HDL Ratio: 2
Triglycerides: 76 mg/dL (ref 0.0–149.0)
VLDL: 15.2 mg/dL (ref 0.0–40.0)

## 2020-03-04 LAB — HEPATIC FUNCTION PANEL
ALT: 41 U/L — ABNORMAL HIGH (ref 0–35)
AST: 42 U/L — ABNORMAL HIGH (ref 0–37)
Albumin: 3.9 g/dL (ref 3.5–5.2)
Alkaline Phosphatase: 66 U/L (ref 39–117)
Bilirubin, Direct: 0.2 mg/dL (ref 0.0–0.3)
Total Bilirubin: 0.7 mg/dL (ref 0.2–1.2)
Total Protein: 6.1 g/dL (ref 6.0–8.3)

## 2020-03-04 LAB — HEMOGLOBIN A1C: Hgb A1c MFr Bld: 6.5 % (ref 4.6–6.5)

## 2020-03-09 ENCOUNTER — Other Ambulatory Visit: Payer: Medicare Other

## 2020-03-11 ENCOUNTER — Other Ambulatory Visit: Payer: Self-pay

## 2020-03-11 ENCOUNTER — Ambulatory Visit (INDEPENDENT_AMBULATORY_CARE_PROVIDER_SITE_OTHER): Payer: Medicare Other | Admitting: Internal Medicine

## 2020-03-11 ENCOUNTER — Encounter: Payer: Self-pay | Admitting: Internal Medicine

## 2020-03-11 VITALS — BP 132/60 | HR 55 | Temp 98.8°F | Ht 60.0 in | Wt 195.6 lb

## 2020-03-11 DIAGNOSIS — F439 Reaction to severe stress, unspecified: Secondary | ICD-10-CM

## 2020-03-11 DIAGNOSIS — R0602 Shortness of breath: Secondary | ICD-10-CM

## 2020-03-11 DIAGNOSIS — N183 Chronic kidney disease, stage 3 unspecified: Secondary | ICD-10-CM | POA: Diagnosis not present

## 2020-03-11 DIAGNOSIS — I890 Lymphedema, not elsewhere classified: Secondary | ICD-10-CM

## 2020-03-11 DIAGNOSIS — E1159 Type 2 diabetes mellitus with other circulatory complications: Secondary | ICD-10-CM | POA: Diagnosis not present

## 2020-03-11 DIAGNOSIS — R945 Abnormal results of liver function studies: Secondary | ICD-10-CM

## 2020-03-11 DIAGNOSIS — G4733 Obstructive sleep apnea (adult) (pediatric): Secondary | ICD-10-CM | POA: Diagnosis not present

## 2020-03-11 DIAGNOSIS — I10 Essential (primary) hypertension: Secondary | ICD-10-CM

## 2020-03-11 DIAGNOSIS — E7800 Pure hypercholesterolemia, unspecified: Secondary | ICD-10-CM | POA: Diagnosis not present

## 2020-03-11 DIAGNOSIS — I480 Paroxysmal atrial fibrillation: Secondary | ICD-10-CM

## 2020-03-11 DIAGNOSIS — Z9861 Coronary angioplasty status: Secondary | ICD-10-CM

## 2020-03-11 DIAGNOSIS — R498 Other voice and resonance disorders: Secondary | ICD-10-CM

## 2020-03-11 DIAGNOSIS — I25110 Atherosclerotic heart disease of native coronary artery with unstable angina pectoris: Secondary | ICD-10-CM | POA: Diagnosis not present

## 2020-03-11 DIAGNOSIS — I2510 Atherosclerotic heart disease of native coronary artery without angina pectoris: Secondary | ICD-10-CM | POA: Diagnosis not present

## 2020-03-11 DIAGNOSIS — R7989 Other specified abnormal findings of blood chemistry: Secondary | ICD-10-CM | POA: Insufficient documentation

## 2020-03-11 LAB — BASIC METABOLIC PANEL
BUN: 20 mg/dL (ref 6–23)
CO2: 32 mEq/L (ref 19–32)
Calcium: 8.9 mg/dL (ref 8.4–10.5)
Chloride: 104 mEq/L (ref 96–112)
Creatinine, Ser: 1.2 mg/dL (ref 0.40–1.20)
GFR: 42.49 mL/min — ABNORMAL LOW (ref >60.00)
Glucose, Bld: 82 mg/dL (ref 70–99)
Potassium: 4.9 mEq/L (ref 3.5–5.1)
Sodium: 142 mEq/L (ref 135–145)

## 2020-03-11 LAB — HEPATIC FUNCTION PANEL
ALT: 42 U/L — ABNORMAL HIGH (ref 0–35)
AST: 44 U/L — ABNORMAL HIGH (ref 0–37)
Albumin: 3.9 g/dL (ref 3.5–5.2)
Alkaline Phosphatase: 66 U/L (ref 39–117)
Bilirubin, Direct: 0.2 mg/dL (ref 0.0–0.3)
Total Bilirubin: 0.7 mg/dL (ref 0.2–1.2)
Total Protein: 6.2 g/dL (ref 6.0–8.3)

## 2020-03-11 NOTE — Progress Notes (Signed)
Patient ID: April [name], female   DOB: 1941/01/01, 79 y.o.   MRN: [id number]   Subjective:    Patient ID: April [name], female    DOB: [birth year], 79 y.o.   MRN: [id number]  HPI This visit occurred during the SARS-CoV-2 public health emergency.  Safety protocols were in place, including screening questions prior to the visit, additional usage of staff PPE, and extensive cleaning of exam room while observing appropriate contact time as indicated for disinfecting solutions.  Patient here for a scheduled follow up. Here to f/u regarding afib, hypertension, elevated cholesterol, lower extremity swelling and OSA. She has been seeing Dr Mortimer Fries.  He stopped her Breo.  Since stopping she can tell a difference in her breathing.  Has some chronic sob, but feels has worsened since stopping, requiring increased use of her rescue inhaler.  No chest pain.  Eating.  No significant problems swallowing.  Has some issues with dry bread.  No abdominal pain.  Bowels moving.  Seeing [facility].  Using lymphedema pump.  Lower extremity swelling is better.  Has seen ENT.  Muscles tight around her vocal cords.  Following (no botox).     Past Medical History:  Diagnosis Date  . CAD S/P percutaneous coronary angioplasty    a. 07/2015 MV: No ischemia, EF 66%;  b. 03/2016 Inflat STEMI/PCI: LM nl, LAD 25d, RI nl, LCX nl, OM1/2 nl, RCA 80p (3.5x16 Promus Premier DES), 50p/m, 30d, RPDA 90 (2.5x12 Promu Premier DES); c. 01/2018 Cath ([place], [place]): Patent RCA stents->Med Rx.  . CKD (chronic kidney disease), stage III ([place])   . Diastolic dysfunction    a. 10/2013 Echo: EF 55-65%, Gr1 DD; b. 01/2018 Echo ([place], [place]): EF 55-60%, Gr2 DD, RVSP 81mHg.  . Diverticulitis   . Dyspnea   . Endometriosis   . Family history of adverse reaction to anesthesia    Mother - had to stay in ICU because of breathing difficulties every time she had anesthesia  . GERD (gastroesophageal reflux disease)   . Hiatal  hernia   . History of colon polyps 08/1994  . History of Migraines   . Hypercholesterolemia   . Hypertension   . LBBB (left bundle branch block)   . Osteoporosis   . PAF ([place]oxysmal [place] fibrillation) ([facility]    a. 03/2016 Dx @ time of MI; b. CHA2DS2VASc = 5-->Eliquis.  . Rheumatic fever   . Sleep apnea    CPAP  . Spasmodic dysphonia   . ST elevation myocardial infarction (STEMI) of inferolateral wall, initial episode of care ([facility] 03/25/2016  . Urine incontinence    Past Surgical History:  Procedure Laterality Date  . ABDOMINAL HYSTERECTOMY     ovaries not removed  . APPENDECTOMY     was removed during hysterectomy  . Breast biopsies     x2  . BREAST EXCISIONAL BIOPSY Bilateral "years ago"   neg  . BREAST SURGERY Bilateral   . BROW LIFT Bilateral 08/15/2019   Procedure: BLEPHAROPLASTY UPPER EYELID; W/EXCESS SKIN BLEPHAROPTOSIS REPAIR; RESECT EX;  Surgeon: FKarle Starch MD;  [facility]  Service: Ophthalmology;  Laterality: Bilateral;  sleep apnea  . CARDIAC CATHETERIZATION  2011   moderate 40% RCA disease  . CARDIAC CATHETERIZATION  01/2010   Dr. Gollan@[facility] : Only noted 40% RCA  . CARDIAC CATHETERIZATION N/A 03/25/2016   Procedure: Left Heart Cath and Coronary Angiography;  Surgeon: D12/11/2015 MD;  [facility];  Service:  Cardiovascular;  Laterality: N/A;  . CARDIAC CATHETERIZATION N/A 03/25/2016   Procedure: Coronary Stent Intervention;  Surgeon: Leonie Man, MD;  Location: [facility];  Service: Cardiovascular;  Laterality: N/A;  . COLONOSCOPY  2013  . ESOPHAGOGASTRODUODENOSCOPY (EGD) WITH PROPOFOL N/A 03/17/2015   Procedure: ESOPHAGOGASTRODUODENOSCOPY (EGD) WITH PROPOFOL;  Surgeon: Christene Lye, MD;  Location: [facility];  Service: Gastroenterology;  Laterality: N/A;  . NM MYOVIEW ([place] [facility])  07/2015   No evidence ischemia or infarction. EF 66%. Low risk  . TONSILLECTOMY    . TRANSTHORACIC ECHOCARDIOGRAM   10/2013   Normal LV size and function. EF 55-65%. GR 1 DD. Otherwise normal.   Family History  Problem Relation Age of Onset  . Arthritis Mother   . Stroke Mother   . Hypertension Mother   . Osteoporosis Mother   . Heart disease Father        MI  . Heart attack Father   . Stroke Brother   . Heart attack Sister   . Breast cancer Other        first cousin x 2  . Stroke Daughter   . Heart attack Daughter   . Colon cancer Neg Hx    Social History   Socioeconomic History  . Marital status: Widowed    Spouse name: Not on file  . Number of children: 2  . Years of education: Not on file  . Highest education level: Not on file  Occupational History  . Not on file  Tobacco Use  . Smoking status: Never Smoker  . Smokeless tobacco: Never Used  Vaping Use  . Vaping Use: Never used  Substance and Sexual Activity  . Alcohol use: No    Alcohol/week: 0.0 standard drinks  . Drug use: No  . Sexual activity: Never  Other Topics Concern  . Not on file  Social History Narrative  . Not on file   Social Determinants of Health   Financial Resource Strain:   . Difficulty of Paying Living Expenses: Not on file  Food Insecurity:   . Worried About Charity fundraiser in the Last Year: Not on file  . Ran Out of Food in the Last Year: Not on file  Transportation Needs:   . Lack of Transportation (Medical): Not on file  . Lack of Transportation (Non-Medical): Not on file  Physical Activity:   . Days of Exercise per Week: Not on file  . Minutes of Exercise per Session: Not on file  Stress:   . Feeling of Stress : Not on file  Social Connections:   . Frequency of Communication with Friends and Family: Not on file  . Frequency of Social Gatherings with Friends and Family: Not on file  . Attends Religious Services: Not on file  . Active Member of Clubs or Organizations: Not on file  . Attends Archivist Meetings: Not on file  . Marital Status: Not on file    Outpatient  Encounter Medications as of 03/11/2020  Medication Sig  . acetaminophen (TYLENOL) 500 MG tablet Take 500 mg by mouth daily as needed for headache (pain).  Marland Kitchen albuterol (PROVENTIL HFA;VENTOLIN HFA) 108 (90 Base) MCG/ACT inhaler Inhale 2 puffs into the lungs every 6 (six) hours as needed for wheezing or shortness of breath.  Marland Kitchen amiodarone (PACERONE) 200 MG tablet TAKE 1 TABLET BY MOUTH DAILY  . apixaban (ELIQUIS) 5 MG TABS tablet Take 1 tablet (5 mg total) by mouth 2 (two) times daily.  Marland Kitchen  aspirin EC 81 MG tablet Take 1 tablet (81 mg total) by mouth daily.  . Azelastine HCl 137 MCG/SPRAY SOLN USE 1 TO 2 SPRAYS IN EACH NOSTRIL TWICE A DAY AS NEEDED FOR DRAINAGE  . cholecalciferol (VITAMIN D) 1000 units tablet Take 1,000 Units by mouth 2 (two) times daily.  . fish oil-omega-3 fatty acids 1000 MG capsule Take 1 g by mouth 2 (two) times daily.   . furosemide (LASIX) 40 MG tablet TAKE 1 TABLET BY MOUTH IN THE MORNING AND THEN TAKE 1/2 TABLET BY MOUTH IN THEEVENING  . hydrALAZINE (APRESOLINE) 10 MG tablet TAKE ONE TABLET BY MOUTH THREE TIMES A DAY  . losartan (COZAAR) 100 MG tablet TAKE 1 TABLET BY MOUTH DAILY  . Misc Natural Products (OSTEO BI-FLEX JOINT SHIELD PO) Take 1 tablet by mouth 2 (two) times daily.   . Multiple Vitamin (MULTIVITAMIN WITH MINERALS) TABS tablet Take 0.5 tablets by mouth 2 (two) times daily.  . nitroGLYCERIN (NITROSTAT) 0.4 MG SL tablet Place 1 tablet (0.4 mg total) under the tongue every 5 (five) minutes as needed.  . pantoprazole (PROTONIX) 40 MG tablet TAKE 1 TABLET BY MOUTH DAILY  . Polyethyl Glycol-Propyl Glycol (SYSTANE OP) Place 1 drop into both eyes daily as needed (dry eyes).  . potassium chloride (KLOR-CON) 10 MEQ tablet TAKE 1 TABLET BY MOUTH TWICE A DAY  . PRESCRIPTION MEDICATION Inhale into the lungs at bedtime. CPAP  . propranolol (INDERAL) 20 MG tablet TAKE ONE TABLET BY MOUTH THREE TIMES A DAY  . rosuvastatin (CRESTOR) 40 MG tablet TAKE 1 TABLET BY MOUTH DAILY  .  traMADol (ULTRAM) 50 MG tablet Take 50 mg by mouth every 8 (eight) hours as needed.  . traMADol (ULTRAM) 50 MG tablet Take 1 every 4-6 hours as needed for pain not controlled by Tylenol  . [DISCONTINUED] erythromycin ophthalmic ointment Apply to sutures 4 times a day for 10-12 days.  Discontinue if allergy develops and call our office (Patient not taking: Reported on 03/11/2020)   No facility-administered encounter medications on file as of 03/11/2020.    Review of Systems  Constitutional: Negative for appetite change and unexpected weight change.  HENT: Negative for congestion and sinus pressure.   Respiratory: Negative for cough and chest tightness.        Some sob noticed as outlined.   Cardiovascular: Negative for chest pain and palpitations.       Lower extremity swelling improved with lymphedema pump.   Gastrointestinal: Negative for abdominal pain, diarrhea, nausea and vomiting.  Genitourinary: Negative for difficulty urinating and dysuria.  Musculoskeletal: Negative for joint swelling and myalgias.  Skin: Negative for color change and rash.  Neurological: Negative for dizziness, light-headedness and headaches.  Psychiatric/Behavioral: Negative for agitation and dysphoric mood.       Objective:    Physical Exam Vitals reviewed.  Constitutional:      General: She is not in acute distress.    Appearance: Normal appearance.  HENT:     Head: Normocephalic and atraumatic.     Right Ear: External ear normal.     Left Ear: External ear normal.  Eyes:     General: No scleral icterus.       Right eye: No discharge.        Left eye: No discharge.     Conjunctiva/sclera: Conjunctivae normal.  Neck:     Thyroid: No thyromegaly.  Cardiovascular:     Rate and Rhythm: Normal rate and regular rhythm.  Pulmonary:  Effort: No respiratory distress.     Breath sounds: Normal breath sounds. No wheezing.  Abdominal:     General: Bowel sounds are normal.     Palpations: Abdomen is  soft.     Tenderness: There is no abdominal tenderness.  Musculoskeletal:        General: No tenderness.     Cervical back: Neck supple. No tenderness.     Comments: Lower extremity swelling - improved (with lymphedema pump).   Lymphadenopathy:     Cervical: No cervical adenopathy.  Skin:    Findings: No erythema or rash.  Neurological:     Mental Status: She is alert.  Psychiatric:        Mood and Affect: Mood normal.        Behavior: Behavior normal.     BP 132/60 (BP Location: Left Arm, Patient Position: Sitting, Cuff Size: Large)   Pulse (!) 55   Temp 98.8 F (37.1 C)   Ht 5' (1.524 m)   Wt 195 lb 9.6 oz (88.7 kg)   LMP 05/01/1972   SpO2 97%   BMI 38.20 kg/m  Wt Readings from Last 3 Encounters:  03/11/20 195 lb 9.6 oz (88.7 kg)  11/27/19 190 lb 12.8 oz (86.5 kg)  11/06/19 198 lb (89.8 kg)     Lab Results  Component Value Date   WBC 5.5 11/04/2019   HGB 12.4 11/04/2019   HCT 37.5 11/04/2019   PLT 227.0 11/04/2019   GLUCOSE 82 03/11/2020   CHOL 147 03/04/2020   TRIG 76.0 03/04/2020   HDL 67.80 03/04/2020   LDLCALC 64 03/04/2020   ALT 42 (H) 03/11/2020   AST 44 (H) 03/11/2020   NA 142 03/11/2020   K 4.9 03/11/2020   CL 104 03/11/2020   CREATININE 1.20 03/11/2020   BUN 20 03/11/2020   CO2 32 03/11/2020   TSH 3.40 11/04/2019   INR 0.99 03/25/2016   HGBA1C 6.5 03/04/2020   MICROALBUR 0.8 01/10/2019    MM 3D SCREEN BREAST BILATERAL  Result Date: 11/06/2019 CLINICAL DATA:  Screening. EXAM: DIGITAL SCREENING BILATERAL MAMMOGRAM WITH TOMO AND CAD COMPARISON:  Previous exam(s). ACR Breast Density Category c: The breast tissue is heterogeneously dense, which may obscure small masses. FINDINGS: There are no findings suspicious for malignancy. Images were processed with CAD. IMPRESSION: No mammographic evidence of malignancy. A result letter of this screening mammogram will be mailed directly to the patient. RECOMMENDATION: Screening mammogram in one year.  (Code:SM-B-01Y) BI-RADS CATEGORY  1: Negative. Electronically Signed   By: Lajean Manes M.D.   On: 11/06/2019 08:09       Assessment & Plan:   Problem List Items Addressed This Visit    Vocal tremor    Saw ENT as outlined.  Follow.       Stress    Handling stress well.  Follow.       SOB (shortness of breath)    She has noticed a little more sob since stopping Breo.   Has rescue inhaler.  Has used more.  D/w Dr Mortimer Fries. Feels from a cardiac standpoint, things are stable.       Paroxysmal atrial fibrillation (HCC)    On eliquis and amiodarone.  Doing well.  Followed by cardiology.       Obstructive sleep apnea (Chronic)    CPAP.       Lymphedema    Doing well with lymphedema pump.  Continue f/u with [facility].       Hypercholesterolemia (Chronic)  On crestor.  Low cholesterol diet and exercise.  Follow lipid panel and liver function tests.       Relevant Orders   Hepatic function panel   Lipid panel   Essential hypertension, benign (Chronic)    Continue losartan.  Blood pressure doing well.  Follow metabolic panel.       Diabetes mellitus ([place])    Low carb diet and exercise.  Follow met b and a1c.       Relevant Orders   Hemoglobin K3T   Basic metabolic panel   Microalbumin / creatinine urine ratio   Coronary artery disease involving native coronary artery of native heart with unstable angina pectoris (HCC)    No chest pain on current medication regimen.  Follow.  Feels her change in her breathing is related to stopping breo.  Follow.        CKD (chronic kidney disease), stage III (HCC) - Primary    Avoid antiinflammatories.  Follow metabolic panel.       Relevant Orders   Basic metabolic panel (Completed)   CAD S/P PCI pRCA Promus DES 3.5 x 16 (4.1 mm), ostRPDA Promus DES 2.5 x 12 (2.7 mm)    Continue risk factor modification.  Continue crestor, losartan and hydralazine.  Follow.       Abnormal liver function tests   Relevant Orders   Hepatic function  panel (Completed)       Einar Pheasant, MD

## 2020-03-13 ENCOUNTER — Other Ambulatory Visit: Payer: Self-pay | Admitting: Internal Medicine

## 2020-03-13 DIAGNOSIS — R945 Abnormal results of liver function studies: Secondary | ICD-10-CM

## 2020-03-13 DIAGNOSIS — R7989 Other specified abnormal findings of blood chemistry: Secondary | ICD-10-CM

## 2020-03-13 NOTE — Progress Notes (Signed)
Order placed for abdominal ultrasound.   

## 2020-03-15 ENCOUNTER — Telehealth: Payer: Self-pay | Admitting: Internal Medicine

## 2020-03-15 NOTE — Telephone Encounter (Signed)
lft vm for pt to call ofc 

## 2020-03-21 ENCOUNTER — Encounter: Payer: Self-pay | Admitting: Internal Medicine

## 2020-03-21 NOTE — Assessment & Plan Note (Signed)
Low carb diet and exercise.  Follow met b and a1c.  

## 2020-03-21 NOTE — Assessment & Plan Note (Signed)
Doing well with lymphedema pump.  Continue f/u with [facility].

## 2020-03-21 NOTE — Assessment & Plan Note (Signed)
No chest pain on current medication regimen.  Follow.  Feels her change in her breathing is related to stopping breo.  Follow.

## 2020-03-21 NOTE — Assessment & Plan Note (Signed)
Continue losartan.  Blood pressure doing well.  Follow metabolic panel.

## 2020-03-21 NOTE — Assessment & Plan Note (Signed)
On crestor.  Low cholesterol diet and exercise.  Follow lipid panel and liver function tests.   

## 2020-03-21 NOTE — Assessment & Plan Note (Signed)
Handling stress well.  Follow.  

## 2020-03-21 NOTE — Assessment & Plan Note (Signed)
On eliquis and amiodarone.  Doing well.  Followed by cardiology.

## 2020-03-21 NOTE — Assessment & Plan Note (Signed)
Avoid antiinflammatories.  Follow metabolic panel.  

## 2020-03-21 NOTE — Assessment & Plan Note (Addendum)
She has noticed a little more sob since stopping Breo.   Has rescue inhaler.  Has used more.  D/w Dr Mortimer Fries. Feels from a cardiac standpoint, things are stable.

## 2020-03-21 NOTE — Assessment & Plan Note (Signed)
Saw ENT as outlined.  Follow.

## 2020-03-21 NOTE — Assessment & Plan Note (Signed)
Continue risk factor modification.  Continue crestor, losartan and hydralazine.  Follow.

## 2020-03-21 NOTE — Assessment & Plan Note (Signed)
CPAP.  

## 2020-03-22 ENCOUNTER — Ambulatory Visit
Admission: RE | Admit: 2020-03-22 | Discharge: 2020-03-22 | Disposition: A | Discharge status: Home / Self Care | Payer: Medicare Other | Source: Ambulatory Visit | Attending: Internal Medicine | Admitting: Internal Medicine

## 2020-03-22 ENCOUNTER — Other Ambulatory Visit: Payer: Self-pay

## 2020-03-22 DIAGNOSIS — R945 Abnormal results of liver function studies: Secondary | ICD-10-CM | POA: Insufficient documentation

## 2020-03-22 DIAGNOSIS — R7989 Other specified abnormal findings of blood chemistry: Secondary | ICD-10-CM

## 2020-03-22 DIAGNOSIS — N281 Cyst of kidney, acquired: Secondary | ICD-10-CM | POA: Diagnosis not present

## 2020-03-28 ENCOUNTER — Other Ambulatory Visit: Payer: Self-pay | Admitting: Internal Medicine

## 2020-03-28 DIAGNOSIS — R945 Abnormal results of liver function studies: Secondary | ICD-10-CM

## 2020-03-28 DIAGNOSIS — R7989 Other specified abnormal findings of blood chemistry: Secondary | ICD-10-CM

## 2020-03-28 NOTE — Progress Notes (Signed)
Order placed for f/u liver panel check.

## 2020-03-30 ENCOUNTER — Other Ambulatory Visit: Payer: Self-pay | Admitting: Internal Medicine

## 2020-03-30 DIAGNOSIS — N281 Cyst of kidney, acquired: Secondary | ICD-10-CM

## 2020-03-30 NOTE — Progress Notes (Signed)
Order placed for urology referral.  

## 2020-04-05 ENCOUNTER — Ambulatory Visit: Payer: Self-pay | Admitting: Urology

## 2020-04-14 ENCOUNTER — Ambulatory Visit: Payer: Medicare Other | Admitting: Urology

## 2020-04-20 ENCOUNTER — Telehealth: Payer: Self-pay | Admitting: Internal Medicine

## 2020-04-20 NOTE — Telephone Encounter (Signed)
Patient called in has had a cough since last week has taken a whole bottle of robitussin DM now she is taking delsym

## 2020-04-20 NOTE — Telephone Encounter (Signed)
Can schedule virtual visit tomorrow.

## 2020-04-20 NOTE — Telephone Encounter (Signed)
Patient stated she has had a cough for the past week. She was called to clarify sx for lab work. She has a lot of congestion nasal/chest, cough. No fever, chills, nausea, and vomiting. Patient stated she received Levaquin every year for this. Use to be Zpack but her BP has been elevated. Patient has taken robitussin DM, she is now taking delsym.  She is better than last week but she needs something for sx. She feels she is getting better and does not want to go to UC.

## 2020-04-20 NOTE — Telephone Encounter (Signed)
Please triage

## 2020-04-20 NOTE — Telephone Encounter (Signed)
scheduled

## 2020-04-21 ENCOUNTER — Telehealth: Payer: Medicare Other | Admitting: Internal Medicine

## 2020-04-21 ENCOUNTER — Other Ambulatory Visit: Payer: Medicare Other

## 2020-04-23 ENCOUNTER — Other Ambulatory Visit: Payer: Self-pay | Admitting: Cardiovascular Disease

## 2020-04-29 ENCOUNTER — Other Ambulatory Visit: Payer: Self-pay

## 2020-04-29 ENCOUNTER — Other Ambulatory Visit (INDEPENDENT_AMBULATORY_CARE_PROVIDER_SITE_OTHER): Payer: Medicare Other

## 2020-04-29 DIAGNOSIS — E1159 Type 2 diabetes mellitus with other circulatory complications: Secondary | ICD-10-CM | POA: Diagnosis not present

## 2020-04-29 DIAGNOSIS — E7800 Pure hypercholesterolemia, unspecified: Secondary | ICD-10-CM

## 2020-04-29 LAB — LIPID PANEL
Cholesterol: 146 mg/dL (ref 0–200)
HDL: 63.9 mg/dL (ref >39.00)
LDL Cholesterol: 65 mg/dL (ref 0–99)
NonHDL: 82.38
Total CHOL/HDL Ratio: 2
Triglycerides: 87 mg/dL (ref 0.0–149.0)
VLDL: 17.4 mg/dL (ref 0.0–40.0)

## 2020-04-29 LAB — HEPATIC FUNCTION PANEL
ALT: 33 U/L (ref 0–35)
AST: 39 U/L — ABNORMAL HIGH (ref 0–37)
Albumin: 3.8 g/dL (ref 3.5–5.2)
Alkaline Phosphatase: 63 U/L (ref 39–117)
Bilirubin, Direct: 0.1 mg/dL (ref 0.0–0.3)
Total Bilirubin: 0.5 mg/dL (ref 0.2–1.2)
Total Protein: 6 g/dL (ref 6.0–8.3)

## 2020-04-29 LAB — BASIC METABOLIC PANEL
BUN: 17 mg/dL (ref 6–23)
CO2: 32 mEq/L (ref 19–32)
Calcium: 8.8 mg/dL (ref 8.4–10.5)
Chloride: 107 mEq/L (ref 96–112)
Creatinine, Ser: 1.03 mg/dL (ref 0.40–1.20)
GFR: 50.99 mL/min — ABNORMAL LOW (ref >60.00)
Glucose, Bld: 110 mg/dL — ABNORMAL HIGH (ref 70–99)
Potassium: 4.6 mEq/L (ref 3.5–5.1)
Sodium: 143 mEq/L (ref 135–145)

## 2020-04-29 LAB — MICROALBUMIN / CREATININE URINE RATIO
Creatinine,U: 29.7 mg/dL
Microalb Creat Ratio: 2.4 mg/g (ref 0.0–30.0)
Microalb, Ur: <0.7 mg/dL (ref 0.0–1.9)

## 2020-04-29 LAB — HEMOGLOBIN A1C: Hgb A1c MFr Bld: 6.1 % (ref 4.6–6.5)

## 2020-04-29 NOTE — Addendum Note (Signed)
Addended by: Ezequiel Ganser on: 04/29/2020 02:36 PM   Modules accepted: Orders

## 2020-04-29 NOTE — Addendum Note (Signed)
Addended by: Ezequiel Ganser on: 04/29/2020 10:47 AM   Modules accepted: Orders

## 2020-05-12 DIAGNOSIS — Z20828 Contact with and (suspected) exposure to other viral communicable diseases: Secondary | ICD-10-CM | POA: Diagnosis not present

## 2020-05-14 DIAGNOSIS — Z20828 Contact with and (suspected) exposure to other viral communicable diseases: Secondary | ICD-10-CM | POA: Diagnosis not present

## 2020-05-24 ENCOUNTER — Other Ambulatory Visit: Payer: Self-pay | Admitting: Cardiovascular Disease

## 2020-05-24 NOTE — Telephone Encounter (Signed)
Rx request sent to pharmacy.  

## 2020-05-25 DIAGNOSIS — X32XXXA Exposure to sunlight, initial encounter: Secondary | ICD-10-CM | POA: Diagnosis not present

## 2020-05-25 DIAGNOSIS — D2262 Melanocytic nevi of left upper limb, including shoulder: Secondary | ICD-10-CM | POA: Diagnosis not present

## 2020-05-25 DIAGNOSIS — D225 Melanocytic nevi of trunk: Secondary | ICD-10-CM | POA: Diagnosis not present

## 2020-05-25 DIAGNOSIS — D2271 Melanocytic nevi of right lower limb, including hip: Secondary | ICD-10-CM | POA: Diagnosis not present

## 2020-05-25 DIAGNOSIS — L570 Actinic keratosis: Secondary | ICD-10-CM | POA: Diagnosis not present

## 2020-05-25 DIAGNOSIS — L821 Other seborrheic keratosis: Secondary | ICD-10-CM | POA: Diagnosis not present

## 2020-05-25 DIAGNOSIS — D2272 Melanocytic nevi of left lower limb, including hip: Secondary | ICD-10-CM | POA: Diagnosis not present

## 2020-05-25 DIAGNOSIS — I890 Lymphedema, not elsewhere classified: Secondary | ICD-10-CM | POA: Diagnosis not present

## 2020-05-25 DIAGNOSIS — D2261 Melanocytic nevi of right upper limb, including shoulder: Secondary | ICD-10-CM | POA: Diagnosis not present

## 2020-05-31 DIAGNOSIS — Z961 Presence of intraocular lens: Secondary | ICD-10-CM | POA: Diagnosis not present

## 2020-06-17 DIAGNOSIS — Z20828 Contact with and (suspected) exposure to other viral communicable diseases: Secondary | ICD-10-CM | POA: Diagnosis not present

## 2020-07-07 ENCOUNTER — Other Ambulatory Visit (INDEPENDENT_AMBULATORY_CARE_PROVIDER_SITE_OTHER): Payer: Medicare Other

## 2020-07-07 ENCOUNTER — Other Ambulatory Visit: Payer: Self-pay

## 2020-07-07 DIAGNOSIS — R945 Abnormal results of liver function studies: Secondary | ICD-10-CM | POA: Diagnosis not present

## 2020-07-07 DIAGNOSIS — R7989 Other specified abnormal findings of blood chemistry: Secondary | ICD-10-CM

## 2020-07-07 LAB — HEPATIC FUNCTION PANEL
ALT: 37 U/L — ABNORMAL HIGH (ref 0–35)
AST: 47 U/L — ABNORMAL HIGH (ref 0–37)
Albumin: 3.7 g/dL (ref 3.5–5.2)
Alkaline Phosphatase: 62 U/L (ref 39–117)
Bilirubin, Direct: 0.1 mg/dL (ref 0.0–0.3)
Total Bilirubin: 0.7 mg/dL (ref 0.2–1.2)
Total Protein: 6.3 g/dL (ref 6.0–8.3)

## 2020-07-08 ENCOUNTER — Encounter: Payer: Self-pay | Admitting: Urology

## 2020-07-08 ENCOUNTER — Ambulatory Visit (INDEPENDENT_AMBULATORY_CARE_PROVIDER_SITE_OTHER): Payer: Medicare Other | Admitting: Urology

## 2020-07-08 VITALS — BP 171/71 | HR 56 | Ht 62.0 in | Wt 192.0 lb

## 2020-07-08 DIAGNOSIS — N281 Cyst of kidney, acquired: Secondary | ICD-10-CM | POA: Diagnosis not present

## 2020-07-08 DIAGNOSIS — N3281 Overactive bladder: Secondary | ICD-10-CM | POA: Diagnosis not present

## 2020-07-08 NOTE — Progress Notes (Signed)
07/08/20 1:30 PM   [name] June 29, 1940 [id number]  CC: Renal cyst, OAB  HPI: I saw April [name] in urology clinic for the above issues.  She is a comorbid 80 year old female who recently had a abdominal ultrasound for elevated LFTs that showed a simple left renal cyst measuring 3.3 cm.  This was minimally changed from a prior CT abdomen with contrast in 2016.  There is no evidence of enhancement or other abnormality.  No evidence of stones or hydronephrosis.  She also has mild overactive bladder symptoms of urgency and frequency, and occasional urge incontinence.  She has mild stress incontinence as well.  She takes Lasix which contributes to her urinary frequency.  She is minimally bothered by her urinary symptoms.  She denies any gross hematuria or smoking history.   PMH: Past Medical History:  Diagnosis Date  . CAD S/P percutaneous coronary angioplasty    a. 07/2015 MV: No ischemia, EF 66%;  b. 03/2016 Inflat STEMI/PCI: LM nl, LAD 25d, RI nl, LCX nl, OM1/2 nl, RCA 80p (3.5x16 Promus Premier DES), 50p/m, 30d, RPDA 90 (2.5x12 Promu Premier DES); c. 01/2018 Cath ([place], [place]): Patent RCA stents->Med Rx.  . CKD (chronic kidney disease), stage III ([place])   . Diastolic dysfunction    a. 10/2013 Echo: EF 55-65%, Gr1 DD; b. 01/2018 Echo ([place], [place]): EF 55-60%, Gr2 DD, RVSP 73mmHg.  . Diverticulitis   . Dyspnea   . Endometriosis   . Family history of adverse reaction to anesthesia    Mother - had to stay in ICU because of breathing difficulties every time she had anesthesia  . GERD (gastroesophageal reflux disease)   . Hiatal hernia   . History of colon polyps 08/1994  . History of Migraines   . Hypercholesterolemia   . Hypertension   . LBBB (left bundle branch block)   . Osteoporosis   . PAF (paroxysmal atrial fibrillation) ([place])    a. 03/2016 Dx @ time of MI; b. CHA2DS2VASc = 5-->Eliquis.  . Rheumatic fever   . Sleep apnea    CPAP  . Spasmodic  dysphonia   . ST elevation myocardial infarction (STEMI) of inferolateral wall, initial episode of care ([place]) 03/25/2016  . Urine incontinence     Surgical History: Past Surgical History:  Procedure Laterality Date  . ABDOMINAL HYSTERECTOMY     ovaries not removed  . APPENDECTOMY     was removed during hysterectomy  . Breast biopsies     x2  . BREAST EXCISIONAL BIOPSY Bilateral "years ago"   neg  . BREAST SURGERY Bilateral   . BROW LIFT Bilateral 08/15/2019   Procedure: BLEPHAROPLASTY UPPER EYELID; W/EXCESS SKIN BLEPHAROPTOSIS REPAIR; RESECT EX;  Surgeon: Karle Starch, MD;  Location: [facility];  Service: Ophthalmology;  Laterality: Bilateral;  sleep apnea  . CARDIAC CATHETERIZATION  2011   moderate 40% RCA disease  . CARDIAC CATHETERIZATION  01/2010   Dr. Gollan@[email or web address] : Only noted 40% RCA  . CARDIAC CATHETERIZATION N/A 03/25/2016   Procedure: Left Heart Cath and Coronary Angiography;  Surgeon: 04/07/2016, MD;  Location: [facility];  Service: Cardiovascular;  Laterality: N/A;  . CARDIAC CATHETERIZATION N/A 03/25/2016   Procedure: Coronary Stent Intervention;  Surgeon: 04/07/2016, MD;  Location: [facility];  Service: Cardiovascular;  Laterality: N/A;  . COLONOSCOPY  2013  . ESOPHAGOGASTRODUODENOSCOPY (EGD) WITH PROPOFOL N/A 03/17/2015   Procedure: ESOPHAGOGASTRODUODENOSCOPY (EGD) WITH PROPOFOL;  Surgeon: 03/30/2015  Jamal Collin, MD;  Location: [facility];  Service: Gastroenterology;  Laterality: N/A;  . NM MYOVIEW ([place] [facility]X)  07/2015   No evidence ischemia or infarction. EF 66%. Low risk  . TONSILLECTOMY    . TRANSTHORACIC ECHOCARDIOGRAM  10/2013   Normal LV size and function. EF 55-65%. GR 1 DD. Otherwise normal.    Family History: Family History  Problem Relation Age of Onset  . Arthritis Mother   . Stroke Mother   . Hypertension Mother   . Osteoporosis Mother   . Heart disease Father        MI  . Heart attack Father   . Stroke Brother    . Heart attack Sister   . Breast cancer Other        first cousin x 2  . Stroke Daughter   . Heart attack Daughter   . Colon cancer Neg Hx     Social History:  reports that she has never smoked. She has never used smokeless tobacco. She reports that she does not drink alcohol and does not use drugs.  Physical Exam: BP (!) 171/71 (BP Location: Left Arm, Patient Position: Sitting, Cuff Size: Large)   Pulse (!) 56   Ht 5\' 2"  (1.575 m)   Wt 192 lb (87.1 kg)   LMP 05/01/1972   BMI 35.12 kg/m    Constitutional:  Alert and oriented, No acute distress. Cardiovascular: No clubbing, cyanosis, or edema. Respiratory: Normal respiratory effort, no increased work of breathing. GI: Abdomen is soft, nontender, nondistended, no abdominal masses Significant lower extremity edema  Laboratory Data: Reviewed  Pertinent Imaging: I have personally viewed and interpreted the recent renal ultrasound as well as the prior CT in 2016.  Simple left lower pole renal cyst, essentially stable from CT in 2016, no evidence of enhancement or worrisome features  Assessment & Plan:   80 year old comorbid female with a simple left renal cyst that does not require further evaluation or work-up.  Reassurance was provided.  She also has mild overactive bladder symptoms, likely secondary to her significant edema and Lasix. We discussed that overactive bladder (OAB) is not a disease, but is a symptom complex that is generally not life-threatening.  Symptoms typically include urinary urgency, frequency, and urge incontinence.  There are numerous treatment options, however there are risks and benefits with both medical and surgical management.  First-line treatment is behavioral therapies including bladder training, pelvic floor muscle training, and fluid management.  Second line treatments include oral antimuscarinics(Ditropan er, Trospium) and beta-3 agonist (Mybetriq). There is typically a period of medication trial (4-8  weeks) to find the optimal therapy and dosing. If symptoms are bothersome despite the above management, third line options include intra-detrusor botox, peripheral tibial nerve stimulation (PTNS), and interstim (SNS). These are more invasive treatments with higher side effect profile, but may improve quality of life for patients with severe OAB symptoms.   She is minimally bothered by her OAB symptoms at this time, and would like to avoid medications No follow-up needed for simple renal cyst   Nickolas Madrid, MD 07/08/2020  [facility] [address], [place], [place] [address] [phone]

## 2020-07-09 ENCOUNTER — Encounter: Payer: Self-pay | Admitting: Internal Medicine

## 2020-07-09 ENCOUNTER — Ambulatory Visit (INDEPENDENT_AMBULATORY_CARE_PROVIDER_SITE_OTHER): Payer: Medicare Other | Admitting: Internal Medicine

## 2020-07-09 ENCOUNTER — Other Ambulatory Visit: Payer: Self-pay

## 2020-07-09 DIAGNOSIS — N183 Chronic kidney disease, stage 3 unspecified: Secondary | ICD-10-CM | POA: Diagnosis not present

## 2020-07-09 DIAGNOSIS — Z0000 Encounter for general adult medical examination without abnormal findings: Secondary | ICD-10-CM

## 2020-07-09 DIAGNOSIS — I25110 Atherosclerotic heart disease of native coronary artery with unstable angina pectoris: Secondary | ICD-10-CM | POA: Diagnosis not present

## 2020-07-09 DIAGNOSIS — I10 Essential (primary) hypertension: Secondary | ICD-10-CM | POA: Diagnosis not present

## 2020-07-09 DIAGNOSIS — Z86010 Personal history of colonic polyps: Secondary | ICD-10-CM

## 2020-07-09 DIAGNOSIS — E7800 Pure hypercholesterolemia, unspecified: Secondary | ICD-10-CM | POA: Diagnosis not present

## 2020-07-09 DIAGNOSIS — K219 Gastro-esophageal reflux disease without esophagitis: Secondary | ICD-10-CM

## 2020-07-09 DIAGNOSIS — N644 Mastodynia: Secondary | ICD-10-CM | POA: Diagnosis not present

## 2020-07-09 DIAGNOSIS — G4733 Obstructive sleep apnea (adult) (pediatric): Secondary | ICD-10-CM

## 2020-07-09 DIAGNOSIS — I480 Paroxysmal atrial fibrillation: Secondary | ICD-10-CM | POA: Diagnosis not present

## 2020-07-09 DIAGNOSIS — E1159 Type 2 diabetes mellitus with other circulatory complications: Secondary | ICD-10-CM | POA: Diagnosis not present

## 2020-07-09 DIAGNOSIS — R7989 Other specified abnormal findings of blood chemistry: Secondary | ICD-10-CM

## 2020-07-09 DIAGNOSIS — F439 Reaction to severe stress, unspecified: Secondary | ICD-10-CM | POA: Diagnosis not present

## 2020-07-09 DIAGNOSIS — R945 Abnormal results of liver function studies: Secondary | ICD-10-CM

## 2020-07-09 LAB — HM DIABETES FOOT EXAM

## 2020-07-09 NOTE — Progress Notes (Signed)
Patient ID: April [name], female   DOB: [birth year], 80 y.o.   MRN: [id number]   Subjective:    Patient ID: April [name], female    DOB: April 26, 1941, 80 y.o.   MRN: [id number]  HPI This visit occurred during the SARS-CoV-2 public health emergency.  Safety protocols were in place, including screening questions prior to the visit, additional usage of staff PPE, and extensive cleaning of exam room while observing appropriate contact time as indicated for disinfecting solutions.  Patient with past history of known CAD, hypertension, hypercholesterolemia and lower extremity edema.  She comes in today to follow up on these issues as well as for a complete physical exam.  Saw urology 07/08/20 - evaluation left renal cyst.  Felt no further w/up warranted.  Desired no further intervention for OAB.  No chest pain.  Breathing stable.  On protonix.  No abdominal pain.  Bowels moving.    Past Medical History:  Diagnosis Date  . CAD S/P percutaneous coronary angioplasty    a. 07/2015 MV: No ischemia, EF 66%;  b. 03/2016 Inflat STEMI/PCI: LM nl, LAD 25d, RI nl, LCX nl, OM1/2 nl, RCA 80p (3.5x16 Promus Premier DES), 50p/m, 30d, RPDA 90 (2.5x12 Promu Premier DES); c. 01/2018 Cath ([place], [place]): Patent RCA stents->Med Rx.  . CKD (chronic kidney disease), stage III ([place])   . Diastolic dysfunction    a. 10/2013 Echo: EF 55-65%, Gr1 DD; b. 01/2018 Echo ([place], [place]): EF 55-60%, Gr2 DD, RVSP 57mHg.  . Diverticulitis   . Dyspnea   . Endometriosis   . Family history of adverse reaction to anesthesia    Mother - had to stay in ICU because of breathing difficulties every time she had anesthesia  . GERD (gastroesophageal reflux disease)   . Hiatal hernia   . History of colon polyps 08/1994  . History of Migraines   . Hypercholesterolemia   . Hypertension   . LBBB (left bundle branch block)   . Osteoporosis   . PAF (paroxysmal atrial fibrillation) ([facility]    a. 03/2016 Dx @ time of  MI; b. CHA2DS2VASc = 5-->Eliquis.  . Rheumatic fever   . Sleep apnea    CPAP  . Spasmodic dysphonia   . ST elevation myocardial infarction (STEMI) of inferolateral wall, initial episode of care ([place] 03/25/2016  . Urine incontinence    Past Surgical History:  Procedure Laterality Date  . ABDOMINAL HYSTERECTOMY     ovaries not removed  . APPENDECTOMY     was removed during hysterectomy  . Breast biopsies     x2  . BREAST EXCISIONAL BIOPSY Bilateral "years ago"   neg  . BREAST SURGERY Bilateral   . BROW LIFT Bilateral 08/15/2019   Procedure: BLEPHAROPLASTY UPPER EYELID; W/EXCESS SKIN BLEPHAROPTOSIS REPAIR; RESECT EX;  Surgeon: FKarle Starch MD;  [facility]  Service: Ophthalmology;  Laterality: Bilateral;  sleep apnea  . CARDIAC CATHETERIZATION  2011   moderate 40% RCA disease  . CARDIAC CATHETERIZATION  01/2010   Dr. GSuzie Portela: Only noted 40% RCA  . CARDIAC CATHETERIZATION N/A 03/25/2016   Procedure: Left Heart Cath and Coronary Angiography;  Surgeon: DLeonie Man MD;  [facility];  Service: Cardiovascular;  Laterality: N/A;  . CARDIAC CATHETERIZATION N/A 03/25/2016   Procedure: Coronary Stent Intervention;  Surgeon: DLeonie Man MD;  [facility];  Service: Cardiovascular;  Laterality: N/A;  . COLONOSCOPY  2013  . ESOPHAGOGASTRODUODENOSCOPY (EGD)  WITH PROPOFOL N/A 03/17/2015   Procedure: ESOPHAGOGASTRODUODENOSCOPY (EGD) WITH PROPOFOL;  Surgeon: Christene Lye, MD;  Location: [facility];  Service: Gastroenterology;  Laterality: N/A;  . NM MYOVIEW ([place] HX)  07/2015   No evidence ischemia or infarction. EF 66%. Low risk  . TONSILLECTOMY    . TRANSTHORACIC ECHOCARDIOGRAM  10/2013   Normal LV size and function. EF 55-65%. GR 1 DD. Otherwise normal.   Family History  Problem Relation Age of Onset  . Arthritis Mother   . Stroke Mother   . Hypertension Mother   . Osteoporosis Mother   . Heart disease Father         MI  . Heart attack Father   . Stroke Brother   . Heart attack Sister   . Breast cancer Other        first cousin x 2  . Stroke Daughter   . Heart attack Daughter   . Colon cancer Neg Hx    Social History   Socioeconomic History  . Marital status: Widowed    Spouse name: Not on file  . Number of children: 2  . Years of education: Not on file  . Highest education level: Not on file  Occupational History  . Not on file  Tobacco Use  . Smoking status: Never Smoker  . Smokeless tobacco: Never Used  Vaping Use  . Vaping Use: Never used  Substance and Sexual Activity  . Alcohol use: No    Alcohol/week: 0.0 standard drinks  . Drug use: No  . Sexual activity: Not Currently    Birth control/protection: Post-menopausal  Other Topics Concern  . Not on file  Social History Narrative  . Not on file   Social Determinants of Health   Financial Resource Strain: Not on file  Food Insecurity: Not on file  Transportation Needs: Not on file  Physical Activity: Not on file  Stress: Not on file  Social Connections: Not on file    Outpatient Encounter Medications as of 07/09/2020  Medication Sig  . acetaminophen (TYLENOL) 500 MG tablet Take 500 mg by mouth daily as needed for headache (pain).  Marland Kitchen albuterol (PROVENTIL HFA;VENTOLIN HFA) 108 (90 Base) MCG/ACT inhaler Inhale 2 puffs into the lungs every 6 (six) hours as needed for wheezing or shortness of breath.  Marland Kitchen amiodarone (PACERONE) 200 MG tablet TAKE 1 TABLET BY MOUTH DAILY  . aspirin EC 81 MG tablet Take 1 tablet (81 mg total) by mouth daily.  . Azelastine HCl 137 MCG/SPRAY SOLN USE 1 TO 2 SPRAYS IN EACH NOSTRIL TWICE A DAY AS NEEDED FOR DRAINAGE  . cholecalciferol (VITAMIN D) 1000 units tablet Take 1,000 Units by mouth 2 (two) times daily.  . fish oil-omega-3 fatty acids 1000 MG capsule Take 1 g by mouth 2 (two) times daily.   . furosemide (LASIX) 40 MG tablet TAKE 1 TABLET BY MOUTH IN THE MORNING AND THEN TAKE 1/2 TABLET BY  MOUTH IN THEEVENING  . hydrALAZINE (APRESOLINE) 10 MG tablet TAKE ONE TABLET BY MOUTH THREE TIMES A DAY  . losartan (COZAAR) 100 MG tablet TAKE 1 TABLET BY MOUTH DAILY  . Misc Natural Products (OSTEO BI-FLEX JOINT SHIELD PO) Take 1 tablet by mouth 2 (two) times daily.   . Multiple Vitamin (MULTIVITAMIN WITH MINERALS) TABS tablet Take 0.5 tablets by mouth 2 (two) times daily.  . nitroGLYCERIN (NITROSTAT) 0.4 MG SL tablet Place 1 tablet (0.4 mg total) under the tongue every 5 (five) minutes as needed.  . pantoprazole (  PROTONIX) 40 MG tablet TAKE 1 TABLET BY MOUTH DAILY  . Polyethyl Glycol-Propyl Glycol (SYSTANE OP) Place 1 drop into both eyes daily as needed (dry eyes).  . potassium chloride (KLOR-CON) 10 MEQ tablet TAKE 1 TABLET BY MOUTH TWICE A DAY  . PRESCRIPTION MEDICATION Inhale into the lungs at bedtime. CPAP  . propranolol (INDERAL) 20 MG tablet TAKE ONE TABLET BY MOUTH THREE TIMES A DAY  . rosuvastatin (CRESTOR) 40 MG tablet TAKE 1 TABLET BY MOUTH DAILY  . traMADol (ULTRAM) 50 MG tablet Take 1 every 4-6 hours as needed for pain not controlled by Tylenol  . apixaban (ELIQUIS) 5 MG TABS tablet Take 1 tablet (5 mg total) by mouth 2 (two) times daily.   No facility-administered encounter medications on file as of 07/09/2020.    Review of Systems  Constitutional: Negative for appetite change and unexpected weight change.  HENT: Negative for congestion, sinus pressure and sore throat.   Eyes: Negative for pain and visual disturbance.  Respiratory: Negative for cough and chest tightness.        Breathing stable.   Cardiovascular: Negative for chest pain and palpitations.       No increased swelling - stable.   Gastrointestinal: Negative for abdominal pain, diarrhea, nausea and vomiting.  Genitourinary: Negative for difficulty urinating and dysuria.  Musculoskeletal: Negative for joint swelling and myalgias.  Skin: Negative for color change and rash.  Neurological: Negative for dizziness,  light-headedness and headaches.  Hematological: Negative for adenopathy. Does not bruise/bleed easily.  Psychiatric/Behavioral: Negative for agitation and dysphoric mood.       Objective:    Physical Exam Vitals reviewed.  Constitutional:      General: She is not in acute distress.    Appearance: Normal appearance.  HENT:     Head: Normocephalic and atraumatic.     Right Ear: External ear normal.     Left Ear: External ear normal.  Eyes:     General: No scleral icterus.       Right eye: No discharge.        Left eye: No discharge.     Conjunctiva/sclera: Conjunctivae normal.  Neck:     Thyroid: No thyromegaly.  Cardiovascular:     Rate and Rhythm: Normal rate and regular rhythm.  Pulmonary:     Effort: No respiratory distress.     Breath sounds: Normal breath sounds. No wheezing.     Comments: Breasts:  No nipple discharge or nipple retraction present.  Could not appreciate any distinct nodules or axillary adenopathy to be present.  Increased tenderness - 5-6:00 left breast - question of thickening.   Abdominal:     General: Bowel sounds are normal.     Palpations: Abdomen is soft.     Tenderness: There is no abdominal tenderness.  Musculoskeletal:        General: No tenderness.     Cervical back: Neck supple. No tenderness.     Comments: Pedal and lower extremity swelling - chronic - no increase (improved).    Lymphadenopathy:     Cervical: No cervical adenopathy.  Skin:    Findings: No erythema or rash.  Neurological:     Mental Status: She is alert.  Psychiatric:        Mood and Affect: Mood normal.        Behavior: Behavior normal.     BP 126/70   Pulse 63   Temp 97.9 F (36.6 C) (Oral)   Resp 16   Ht  5' (1.524 m)   Wt 193 lb (87.5 kg)   LMP 05/01/1972   SpO2 98%   BMI 37.69 kg/m  Wt Readings from Last 3 Encounters:  07/09/20 193 lb (87.5 kg)  07/08/20 192 lb (87.1 kg)  03/11/20 195 lb 9.6 oz (88.7 kg)     Lab Results  Component Value Date    WBC 5.5 11/04/2019   HGB 12.4 11/04/2019   HCT 37.5 11/04/2019   PLT 227.0 11/04/2019   GLUCOSE 110 (H) 04/29/2020   CHOL 146 04/29/2020   TRIG 87.0 04/29/2020   HDL 63.90 04/29/2020   LDLCALC 65 04/29/2020   ALT 37 (H) 07/07/2020   AST 47 (H) 07/07/2020   NA 143 04/29/2020   K 4.6 04/29/2020   CL 107 04/29/2020   CREATININE 1.03 04/29/2020   BUN 17 04/29/2020   CO2 32 04/29/2020   TSH 3.40 11/04/2019   INR 0.99 03/25/2016   HGBA1C 6.1 04/29/2020   MICROALBUR <0.7 04/29/2020    US Abdomen Complete  Result Date: 03/22/2020 CLINICAL DATA:  80 year old female with abnormal LFTs. EXAM: ABDOMEN ULTRASOUND COMPLETE COMPARISON:  CT Abdomen and Pelvis 02/18/2015. FINDINGS: Gallbladder: No gallstones or wall thickening visualized. No sonographic Murphy sign noted by sonographer. Common bile duct: Diameter: 6 mm, normal. Liver: Liver echogenicity is at the upper limits of normal (image 42). No discrete liver lesion. No intrahepatic biliary ductal dilatation. Portal vein is patent on color Doppler imaging with normal direction of blood flow towards the liver. IVC: No abnormality visualized. Pancreas: Visualized portion unremarkable. Spleen: Size and appearance within normal limits. Right Kidney: Length: 10.6 cm. Echogenicity within normal limits. No mass or hydronephrosis visualized. Left Kidney: Length: 10.6 cm. Echogenicity within normal limits. No solid mass or hydronephrosis visualized. Chronic benign lower pole renal cyst, mildly increased from 2.7 to 3.3 cm diameter since 2016. Abdominal aorta: No aneurysm visualized. Other findings: None. IMPRESSION: Normal for age ultrasound appearance of the abdomen. No explanation for abnormal LFTs. Electronically Signed   By: Genevie Ann M.D.   On: 03/22/2020 17:07       Assessment & Plan:   Problem List Items Addressed This Visit    Abnormal liver function tests    Follow liver function tests.        Breast tenderness    Breast tenderness as  outlined.  Obtain diagnostic mammogram to further evaluate.        Relevant Orders   MM DIAG BREAST TOMO UNI LEFT   US BREAST LTD UNI LEFT INC AXILLA   CKD (chronic kidney disease), stage III ([place])    Avoid antiinflammatories.  Follow metabolic panel.       Coronary artery disease involving native coronary artery of native heart with unstable angina pectoris (HCC)    No chest pain - continues hydralazine, losartan and eliquis.  Follow.       Diabetes mellitus ([place])    Low carb diet and exercise.  Follow met b and a1c.       Relevant Orders   Hemoglobin A1c   Essential hypertension, benign (Chronic)    Continue losartan.  Blood pressure doing well.  Follow pressures.  Follow metabolic panel.       Relevant Orders   CBC with Differential/Platelet   Basic metabolic panel   GERD (gastroesophageal reflux disease)    No upper symptoms reported.  Protonix.       Health care maintenance    Physical today 07/09/20.  Colonoscopy 06/2011.  Overdue.  Request f/u with colonoscopy.  Mammogram 11/06/19 - Birads I.       History of colonic polyps    Had colonoscopy 06/2011.  Recommended f/u in 5 years.  Previously saw Dr Bary Castilla.  cologuard negative.  She requested referral back for question of need for colonoscopy.  Previously saw Dr Tiffany Kocher.       Relevant Orders   Ambulatory referral to Gastroenterology   Hypercholesterolemia (Chronic)    On crestor.  Low cholesterol diet and exercise.  Follow lipid panel and liver function tests.        Relevant Orders   TSH   Lipid panel   Hepatic function panel   Obstructive sleep apnea (Chronic)    Continue cpap.       Paroxysmal atrial fibrillation (HCC)    On eliquis and amiodarone.  Stable.  Followed by cardiology.       Stress    Handling stress.  Follow.            Einar Pheasant, MD

## 2020-07-14 ENCOUNTER — Encounter: Payer: Self-pay | Admitting: Internal Medicine

## 2020-07-14 DIAGNOSIS — N644 Mastodynia: Secondary | ICD-10-CM | POA: Insufficient documentation

## 2020-07-18 ENCOUNTER — Encounter: Payer: Self-pay | Admitting: Internal Medicine

## 2020-07-18 NOTE — Assessment & Plan Note (Signed)
Physical today 07/09/20.  Colonoscopy 06/2011.  Overdue.  Request f/u with colonoscopy.  Mammogram 11/06/19 - Birads I.

## 2020-07-18 NOTE — Assessment & Plan Note (Signed)
Avoid antiinflammatories.  Follow metabolic panel.  

## 2020-07-18 NOTE — Assessment & Plan Note (Signed)
On eliquis and amiodarone.  Stable.  Followed by cardiology.

## 2020-07-18 NOTE — Assessment & Plan Note (Signed)
Handling stress.  Follow.   

## 2020-07-18 NOTE — Assessment & Plan Note (Signed)
No chest pain - continues hydralazine, losartan and eliquis.  Follow.

## 2020-07-18 NOTE — Assessment & Plan Note (Signed)
Breast tenderness as outlined.  Obtain diagnostic mammogram to further evaluate.

## 2020-07-18 NOTE — Assessment & Plan Note (Signed)
Follow liver function tests.

## 2020-07-18 NOTE — Assessment & Plan Note (Signed)
No upper symptoms reported.  Protonix.  

## 2020-07-18 NOTE — Assessment & Plan Note (Signed)
Continue losartan.  Blood pressure doing well.  Follow pressures.  Follow metabolic panel.  

## 2020-07-18 NOTE — Assessment & Plan Note (Signed)
Continue cpap.  

## 2020-07-18 NOTE — Assessment & Plan Note (Signed)
Low carb diet and exercise.  Follow met b and a1c.  

## 2020-07-18 NOTE — Assessment & Plan Note (Signed)
Had colonoscopy 06/2011.  Recommended f/u in 5 years.  Previously saw Dr Bary Castilla.  cologuard negative.  She requested referral back for question of need for colonoscopy.  Previously saw Dr Tiffany Kocher.

## 2020-07-18 NOTE — Assessment & Plan Note (Signed)
On crestor.  Low cholesterol diet and exercise.  Follow lipid panel and liver function tests.   

## 2020-07-21 ENCOUNTER — Other Ambulatory Visit: Payer: Medicare Other

## 2020-07-23 ENCOUNTER — Other Ambulatory Visit: Payer: Self-pay | Admitting: Cardiovascular Disease

## 2020-07-27 ENCOUNTER — Ambulatory Visit
Admission: RE | Admit: 2020-07-27 | Discharge: 2020-07-27 | Disposition: A | Discharge status: Home / Self Care | Payer: Medicare Other | Source: Ambulatory Visit | Attending: Internal Medicine | Admitting: Internal Medicine

## 2020-07-27 ENCOUNTER — Other Ambulatory Visit: Payer: Self-pay

## 2020-07-27 DIAGNOSIS — N644 Mastodynia: Secondary | ICD-10-CM | POA: Diagnosis not present

## 2020-08-02 NOTE — Progress Notes (Deleted)
Cardiology Office Note  Date:  08/02/2020   ID:  April [name], DOB June 06, 1940, MRN [id number]  PCP:  Einar Pheasant, MD   No chief complaint on file.   HPI:  April [name] is a pleasant 80 year old woman with history of  coronary artery disease,  chronic shortness of breath  STEMI November 2017 stent x2 to her RCA atrial fibrillation while in the hospital November 2017 elevated CHADS VASC (at least 4) who presents for follow-up of her coronary artery disease , atrial fib  Prior stenting details from 2017 CAD S/P PCI pRCA Promus DES 3.5 x 16 (4.1 mm), ostRPDA Promus DES 2.5 x 12 (2.7 mm)  done at [facility] by report  In follow-up today she reports everything is relatively stable She is having some weakness with speech Has done 6 week speech therapy  Doing physical therapy at  [facility]   chronic back pain Previously seen by Dr. Arnoldo Morale in [place]  Lab work reviewed  hemoglobin A1c 6.5 Total chol 153, LDL 65  EKG personally reviewed by myself on todays visit Shows sinus bradycardia rate 55 bpm  Interventricular conduction delay/incomplete left bundle branch block  Reports having episode of chest pain while [place] the [place] Took nitroglycerin x2, did not seem to help Went to urgent care initially Was sent to hospital 01/2018 Was seen at [facility], there was troponin elevation 0.07 down to 0.049 on October 7 and 8   Echo  1. Normal biventricular systolic function and size. LVEF 55-60%.  2. No significant valvular dysfunction.  3. Grade II diastolic dysfunction with elevated left-sided filling  pressures.  4. RVSP 41 mmHg consistent with mild pulmonary hypertension  Full cardiac catheterization report is not available but discharge summary reports stents were patent to the RCA    PMH:   has a past medical history of CAD S/P percutaneous coronary angioplasty, CKD (chronic kidney disease), stage III ([place]), Diastolic  dysfunction, Diverticulitis, Dyspnea, Endometriosis, Family history of adverse reaction to anesthesia, GERD (gastroesophageal reflux disease), Hiatal hernia, History of colon polyps (08/1994), History of Migraines, Hypercholesterolemia, Hypertension, LBBB (left bundle branch block), Osteoporosis, PAF (paroxysmal atrial fibrillation) ([place]), Rheumatic fever, Sleep apnea, Spasmodic dysphonia, ST elevation myocardial infarction (STEMI) of inferolateral wall, initial episode of care ([place]) (03/25/2016), and Urine incontinence.  PSH:    Past Surgical History:  Procedure Laterality Date  . ABDOMINAL HYSTERECTOMY     ovaries not removed  . APPENDECTOMY     was removed during hysterectomy  . Breast biopsies     x2  . BREAST EXCISIONAL BIOPSY Bilateral "years ago"   neg  . BREAST SURGERY Bilateral   . BROW LIFT Bilateral 08/15/2019   Procedure: BLEPHAROPLASTY UPPER EYELID; W/EXCESS SKIN BLEPHAROPTOSIS REPAIR; RESECT EX;  Surgeon: Karle Starch, MD;  Location: [place];  Service: Ophthalmology;  Laterality: Bilateral;  sleep apnea  . CARDIAC CATHETERIZATION  2011   moderate 40% RCA disease  . CARDIAC CATHETERIZATION  01/2010   Dr. Tayven Renteria@[email or web address] : Only noted 40% RCA  . CARDIAC CATHETERIZATION N/A 03/25/2016   Procedure: Left Heart Cath and Coronary Angiography;  Surgeon: 04/07/2016, MD;  Location: [place] [facility];  Service: Cardiovascular;  Laterality: N/A;  . CARDIAC CATHETERIZATION N/A 03/25/2016   Procedure: Coronary Stent Intervention;  Surgeon: 04/07/2016, MD;  Location: [place] [facility];  Service: Cardiovascular;  Laterality: N/A;  . COLONOSCOPY  2013  . ESOPHAGOGASTRODUODENOSCOPY (EGD) WITH PROPOFOL N/A 03/17/2015   Procedure:  ESOPHAGOGASTRODUODENOSCOPY (EGD) WITH PROPOFOL;  Surgeon: Christene Lye, MD;  Location: [facility];  Service: Gastroenterology;  Laterality: N/A;  . NM MYOVIEW ([place] HX)  07/2015   No evidence ischemia or infarction. EF 66%. Low risk  .  TONSILLECTOMY    . TRANSTHORACIC ECHOCARDIOGRAM  10/2013   Normal LV size and function. EF 55-65%. GR 1 DD. Otherwise normal.    Current Outpatient Medications  Medication Sig Dispense Refill  . acetaminophen (TYLENOL) 500 MG tablet Take 500 mg by mouth daily as needed for headache (pain).    Marland Kitchen albuterol (PROVENTIL HFA;VENTOLIN HFA) 108 (90 Base) MCG/ACT inhaler Inhale 2 puffs into the lungs every 6 (six) hours as needed for wheezing or shortness of breath. 1 Inhaler 6  . amiodarone (PACERONE) 200 MG tablet TAKE 1 TABLET BY MOUTH DAILY 90 tablet 0  . apixaban (ELIQUIS) 5 MG TABS tablet Take 1 tablet (5 mg total) by mouth 2 (two) times daily. 180 tablet 1  . aspirin EC 81 MG tablet Take 1 tablet (81 mg total) by mouth daily. 90 tablet 3  . Azelastine HCl 137 MCG/SPRAY SOLN USE 1 TO 2 SPRAYS IN EACH NOSTRIL TWICE A DAY AS NEEDED FOR DRAINAGE 90 mL 0  . cholecalciferol (VITAMIN D) 1000 units tablet Take 1,000 Units by mouth 2 (two) times daily.    . fish oil-omega-3 fatty acids 1000 MG capsule Take 1 g by mouth 2 (two) times daily.     . furosemide (LASIX) 40 MG tablet TAKE 1 TABLET BY MOUTH IN THE MORNING AND THEN TAKE 1/2 TABLET BY MOUTH IN THEEVENING 135 tablet 1  . hydrALAZINE (APRESOLINE) 10 MG tablet TAKE ONE TABLET BY MOUTH THREE TIMES A DAY 270 tablet 0  . losartan (COZAAR) 100 MG tablet TAKE 1 TABLET BY MOUTH DAILY 90 tablet 3  . Misc Natural Products (OSTEO BI-FLEX JOINT SHIELD PO) Take 1 tablet by mouth 2 (two) times daily.     . Multiple Vitamin (MULTIVITAMIN WITH MINERALS) TABS tablet Take 0.5 tablets by mouth 2 (two) times daily.    . nitroGLYCERIN (NITROSTAT) 0.4 MG SL tablet Place 1 tablet (0.4 mg total) under the tongue every 5 (five) minutes as needed. 25 tablet 0  . pantoprazole (PROTONIX) 40 MG tablet TAKE 1 TABLET BY MOUTH DAILY. WILL RENEW AT NEXT APPOINTMENT. 30 tablet 1  . Polyethyl Glycol-Propyl Glycol (SYSTANE OP) Place 1 drop into both eyes daily as needed (dry eyes).     . potassium chloride (KLOR-CON) 10 MEQ tablet TAKE 1 TABLET BY MOUTH TWICE A DAY 180 tablet 0  . PRESCRIPTION MEDICATION Inhale into the lungs at bedtime. CPAP    . propranolol (INDERAL) 20 MG tablet TAKE ONE TABLET BY MOUTH THREE TIMES A DAY 270 tablet 0  . rosuvastatin (CRESTOR) 40 MG tablet TAKE 1 TABLET BY MOUTH DAILY 90 tablet 0  . traMADol (ULTRAM) 50 MG tablet Take 1 every 4-6 hours as needed for pain not controlled by Tylenol 6 tablet 0   No current facility-administered medications for this visit.     Allergies:   Penicillins and Sulfa antibiotics   Social History:  The patient  reports that she has never smoked. She has never used smokeless tobacco. She reports that she does not drink alcohol and does not use drugs.   Family History:   family history includes Arthritis in her mother; Breast cancer in an other family member; Heart attack in her daughter, father, and sister; Heart disease in her father; Hypertension  in her mother; Osteoporosis in her mother; Stroke in her brother, daughter, and mother.    Review of Systems: Review of Systems  Respiratory: Negative.   Cardiovascular: Negative.   Gastrointestinal: Negative.   Musculoskeletal: Negative.   Neurological: Negative.   Psychiatric/Behavioral: Negative.   All other systems reviewed and are negative.   PHYSICAL EXAM: VS:  LMP 05/01/1972  , BMI There is no height or weight on file to calculate BMI. Constitutional:  oriented to person, place, and time. No distress.  HENT:  Head: Grossly normal Eyes:  no discharge. No scleral icterus.  Neck: No JVD, no carotid bruits  Cardiovascular: Regular rate and rhythm, no murmurs appreciated Pulmonary/Chest: Clear to auscultation bilaterally, no wheezes or rails Abdominal: Soft.  no distension.  no tenderness.  Musculoskeletal: Normal range of motion Neurological:  normal muscle tone. Coordination normal. No atrophy Skin: Skin warm and dry Psychiatric: normal affect,  pleasant   Recent Labs: 11/04/2019: Hemoglobin 12.4; Platelets 227.0; TSH 3.40 04/29/2020: BUN 17; Creatinine, Ser 1.03; Potassium 4.6; Sodium 143 07/07/2020: ALT 37    Lipid Panel Lab Results  Component Value Date   CHOL 146 04/29/2020   HDL 63.90 04/29/2020   LDLCALC 65 04/29/2020   TRIG 87.0 04/29/2020      Wt Readings from Last 3 Encounters:  07/09/20 193 lb (87.5 kg)  07/08/20 192 lb (87.1 kg)  03/11/20 195 lb 9.6 oz (88.7 kg)     ASSESSMENT AND PLAN:  Essential hypertension, benign Blood pressure is well controlled on today's visit. No changes made to the medications.  Hypercholesterolemia Cholesterol at goal, continue current medications  Coronary artery disease involving native coronary artery of native heart with unstable angina pectoris ([place]) Currently with no symptoms of angina. No further workup at this time. Continue current medication regimen.  Paroxysmal atrial fibrillation Tolerating anticoagulation, maintain normal sinus rhythm elevated CHADS VASC (at least 4)  SOB (shortness of breath) Recommend she continue her physical therapy at [facility]  Total encounter time more than 25 minutes Greater than 50% was spent in counseling and coordination of care with the patient  Disposition:   F/U  12 months   No orders of the defined types were placed in this encounter.    Signed, Esmond Plants, M.D., Ph.D. 08/02/2020  [facility] [place], [place] [phone]

## 2020-08-03 ENCOUNTER — Ambulatory Visit: Payer: Medicare Other | Admitting: Cardiovascular Disease

## 2020-08-03 DIAGNOSIS — E785 Hyperlipidemia, unspecified: Secondary | ICD-10-CM

## 2020-08-03 DIAGNOSIS — I10 Essential (primary) hypertension: Secondary | ICD-10-CM

## 2020-08-03 DIAGNOSIS — I5032 Chronic diastolic (congestive) heart failure: Secondary | ICD-10-CM

## 2020-08-03 DIAGNOSIS — E1159 Type 2 diabetes mellitus with other circulatory complications: Secondary | ICD-10-CM

## 2020-08-03 DIAGNOSIS — N183 Chronic kidney disease, stage 3 unspecified: Secondary | ICD-10-CM

## 2020-08-03 DIAGNOSIS — I25118 Atherosclerotic heart disease of native coronary artery with other forms of angina pectoris: Secondary | ICD-10-CM

## 2020-08-03 DIAGNOSIS — E7800 Pure hypercholesterolemia, unspecified: Secondary | ICD-10-CM

## 2020-08-03 DIAGNOSIS — I480 Paroxysmal atrial fibrillation: Secondary | ICD-10-CM

## 2020-08-12 ENCOUNTER — Telehealth: Payer: Self-pay | Admitting: Internal Medicine

## 2020-08-12 NOTE — Telephone Encounter (Signed)
Left message for patient to call back and schedule Medicare Annual Wellness Visit (AWV)   Please schedule on the [place] template  Last AWV 5/50/01; please schedule at anytime   This should be a 45 minute visit

## 2020-08-12 NOTE — Telephone Encounter (Signed)
PT called back to decline AWV

## 2020-08-12 NOTE — Telephone Encounter (Signed)
Noted. Thanks.

## 2020-08-30 ENCOUNTER — Other Ambulatory Visit: Payer: Self-pay | Admitting: Cardiovascular Disease

## 2020-09-01 NOTE — Progress Notes (Signed)
Cardiology Office Note  Date:  09/03/2020   ID:  April [name], DOB [birth year], MRN [id number]  PCP:  Einar Pheasant, MD   Chief Complaint  Patient presents with  . 12 month follow up     Patient c/o shortness of breath and LE edema. Medications reviewed by the patient verbally.     HPI:  April [name] is a pleasant 61 would isolate ice it-year-old woman with history of  coronary artery disease,  chronic shortness of breath  STEMI November 2017 stent x2 to her RCA atrial fibrillation while in the hospital November 2017 elevated CHADS VASC (at least 4) who presents for follow-up of her coronary artery disease , atrial fib  In follow-up today reports having worsening lower extremity edema Some associated shortness of breath Has been compliant with her Lasix 40 daily, despite this with worsening symptoms Over the past 4 to 5 days she has doubled her Lasix up to 40 twice daily Reports some improvement in her symptoms, still has more fluid to go  Denies chest pain concerning for angina No tachypalpitations concerning for arrhythmia Low heartbeat but relatively asymptomatic Cut back a little bit on her propranolol sometimes 2 a day other days 3 a day  Recent lab work reviewed A1C 6.1 CR 1.03  EKG personally reviewed by myself on todays visit Shows sinus bradycardia rate 54 bpm left bundle branch block  Other past medical history reviewed Prior stenting details from 2017 CAD S/P PCI pRCA Promus DES 3.5 x 16 (4.1 mm), ostRPDA Promus DES 2.5 x 12 (2.7 mm)  done [facility] by report  Chronic weakness with speech Previously completed speech therapy   chronic back pain Previously seen by Dr. Arnoldo Morale in [place]  chest pain while she was at the beach 2019 Took nitroglycerin x2, did not seem to help Went to urgent care initially Was sent to hospital 01/2018 Was seen at [facility], there was troponin elevation 0.07 down to 0.049 on October 7 and  8   Echo  1. Normal biventricular systolic function and size. LVEF 55-60%.  2. No significant valvular dysfunction.  3. Grade II diastolic dysfunction with elevated left-sided filling  pressures.  4. RVSP 41 mmHg consistent with mild pulmonary hypertension  Full cardiac catheterization report is not available but discharge summary reports stents were patent to the RCA    PMH:   has a past medical history of CAD S/P percutaneous coronary angioplasty, CKD (chronic kidney disease), stage III ([place]), Diastolic dysfunction, Diverticulitis, Dyspnea, Endometriosis, Family history of adverse reaction to anesthesia, GERD (gastroesophageal reflux disease), Hiatal hernia, History of colon polyps (08/1994), History of Migraines, Hypercholesterolemia, Hypertension, LBBB (left bundle branch block), Osteoporosis, PAF (paroxysmal atrial fibrillation) ([place]), Rheumatic fever, Sleep apnea, Spasmodic dysphonia, ST elevation myocardial infarction (STEMI) of inferolateral wall, initial episode of care ([place]) (03/25/2016), and Urine incontinence.  PSH:    Past Surgical History:  Procedure Laterality Date  . ABDOMINAL HYSTERECTOMY     ovaries not removed  . APPENDECTOMY     was removed during hysterectomy  . Breast biopsies     x2  . BREAST EXCISIONAL BIOPSY Bilateral "years ago"   neg  . BREAST SURGERY Bilateral   . BROW LIFT Bilateral 08/15/2019   Procedure: BLEPHAROPLASTY UPPER EYELID; W/EXCESS SKIN BLEPHAROPTOSIS REPAIR; RESECT EX;  Surgeon: Karle Starch, MD;  Location: [place];  Service: Ophthalmology;  Laterality: Bilateral;  sleep apnea  . CARDIAC CATHETERIZATION  2011   moderate 40% RCA disease  .  CARDIAC CATHETERIZATION  01/2010   Dr. Vasilios [email or web address] : Only noted 40% RCA  . CARDIAC CATHETERIZATION N/A 03/25/2016   Procedure: Left Heart Cath and Coronary Angiography;  Surgeon: 04/07/2016, MD;  Location: [place] [facility];  Service: Cardiovascular;  Laterality: N/A;  . CARDIAC  CATHETERIZATION N/A 03/25/2016   Procedure: Coronary Stent Intervention;  Surgeon: 04/07/2016, MD;  Location: [place] [facility];  Service: Cardiovascular;  Laterality: N/A;  . COLONOSCOPY  2013  . ESOPHAGOGASTRODUODENOSCOPY (EGD) WITH PROPOFOL N/A 03/17/2015   Procedure: ESOPHAGOGASTRODUODENOSCOPY (EGD) WITH PROPOFOL;  Surgeon: 03/30/2015, MD;  Location: [facility] ENDOSCOPY;  Service: Gastroenterology;  Laterality: N/A;  . NM MYOVIEW ([place] HX)  07/2015   No evidence ischemia or infarction. EF 66%. Low risk  . TONSILLECTOMY    . TRANSTHORACIC ECHOCARDIOGRAM  10/2013   Normal LV size and function. EF 55-65%. GR 1 DD. Otherwise normal.    Current Outpatient Medications  Medication Sig Dispense Refill  . acetaminophen (TYLENOL) 500 MG tablet Take 500 mg by mouth daily as needed for headache (pain).    11/2013 albuterol (PROVENTIL HFA;VENTOLIN HFA) 108 (90 Base) MCG/ACT inhaler Inhale 2 puffs into the lungs every 6 (six) hours as needed for wheezing or shortness of breath. 1 Inhaler 6  . amiodarone (PACERONE) 200 MG tablet TAKE 1 TABLET BY MOUTH DAILY 90 tablet 0  . apixaban (ELIQUIS) 5 MG TABS tablet Take 1 tablet (5 mg total) by mouth 2 (two) times daily. 180 tablet 1  . aspirin EC 81 MG tablet Take 1 tablet (81 mg total) by mouth daily. 90 tablet 3  . Azelastine HCl 137 MCG/SPRAY SOLN USE 1 TO 2 SPRAYS IN EACH NOSTRIL TWICE A DAY AS NEEDED FOR DRAINAGE 90 mL 0  . cholecalciferol (VITAMIN D) 1000 units tablet Take 1,000 Units by mouth 2 (two) times daily.    . fish oil-omega-3 fatty acids 1000 MG capsule Take 1 g by mouth 2 (two) times daily.     . furosemide (LASIX) 40 MG tablet Take 40 mg by mouth 2 (two) times daily.    . hydrALAZINE (APRESOLINE) 10 MG tablet TAKE ONE TABLET BY MOUTH THREE TIMES A DAY 270 tablet 0  . losartan (COZAAR) 100 MG tablet TAKE 1 TABLET BY MOUTH DAILY 90 tablet 3  . Misc Natural Products (OSTEO BI-FLEX JOINT SHIELD PO) Take 1 tablet by mouth 2 (two) times  daily.     . Multiple Vitamin (MULTIVITAMIN WITH MINERALS) TABS tablet Take 0.5 tablets by mouth 2 (two) times daily.    . nitroGLYCERIN (NITROSTAT) 0.4 MG SL tablet Place 1 tablet (0.4 mg total) under the tongue every 5 (five) minutes as needed. 25 tablet 0  . pantoprazole (PROTONIX) 40 MG tablet TAKE 1 TABLET BY MOUTH DAILY. WILL RENEW AT NEXT APPOINTMENT. 30 tablet 1  . Polyethyl Glycol-Propyl Glycol (SYSTANE OP) Place 1 drop into both eyes daily as needed (dry eyes).    . potassium chloride (KLOR-CON) 10 MEQ tablet TAKE 1 TABLET BY MOUTH TWICE A DAY 180 tablet 0  . PRESCRIPTION MEDICATION Inhale into the lungs at bedtime. CPAP    . propranolol (INDERAL) 20 MG tablet TAKE ONE TABLET BY MOUTH THREE TIMES A DAY 270 tablet 0  . rosuvastatin (CRESTOR) 40 MG tablet TAKE 1 TABLET BY MOUTH DAILY 90 tablet 0  . traMADol (ULTRAM) 50 MG tablet Take 1 every 4-6 hours as needed for pain not controlled by Tylenol 6 tablet 0   No current facility-administered  medications for this visit.     Allergies:   Penicillins and Sulfa antibiotics   Social History:  The patient  reports that she has never smoked. She has never used smokeless tobacco. She reports that she does not drink alcohol and does not use drugs.   Family History:   family history includes Arthritis in her mother; Breast cancer in an other family member; Heart attack in her daughter, father, and sister; Heart disease in her father; Hypertension in her mother; Osteoporosis in her mother; Stroke in her brother, daughter, and mother.    Review of Systems: Review of Systems  HENT: Negative.   Respiratory: Positive for shortness of breath.   Cardiovascular: Positive for leg swelling.  Gastrointestinal: Negative.   Musculoskeletal: Negative.   Neurological: Negative.   Psychiatric/Behavioral: Negative.   All other systems reviewed and are negative.   PHYSICAL EXAM: VS:  BP (!) 142/68 (BP Location: Left Arm, Patient Position: Sitting, Cuff  Size: Normal)   Pulse (!) 52   Ht 5' (1.524 m)   Wt 193 lb 8 oz (87.8 kg)   LMP 05/01/1972   SpO2 98%   BMI 37.79 kg/m  , BMI Body mass index is 37.79 kg/m. Constitutional:  oriented to person, place, and time. No distress.  HENT:  Head: Grossly normal Eyes:  no discharge. No scleral icterus.  Neck: No JVD, no carotid bruits  Cardiovascular: Regular rate and rhythm, no murmurs appreciated Pulmonary/Chest: Clear to auscultation bilaterally, no wheezes or rails Abdominal: Soft.  no distension.  no tenderness.  Musculoskeletal: Normal range of motion Neurological:  normal muscle tone. Coordination normal. No atrophy Skin: Skin warm and dry Psychiatric: normal affect, pleasant  Recent Labs: 11/04/2019: Hemoglobin 12.4; Platelets 227.0; TSH 3.40 04/29/2020: BUN 17; Creatinine, Ser 1.03; Potassium 4.6; Sodium 143 07/07/2020: ALT 37    Lipid Panel Lab Results  Component Value Date   CHOL 146 04/29/2020   HDL 63.90 04/29/2020   LDLCALC 65 04/29/2020   TRIG 87.0 04/29/2020      Wt Readings from Last 3 Encounters:  09/03/20 193 lb 8 oz (87.8 kg)  07/09/20 193 lb (87.5 kg)  07/08/20 192 lb (87.1 kg)     ASSESSMENT AND PLAN:  Essential hypertension, benign Blood pressure is well controlled on today's visit. No changes made to the medications.  Hypercholesterolemia Cholesterol at goal, continue current medications  Coronary artery disease involving native coronary artery of native heart with unstable angina pectoris ([place]) Currently with no symptoms of angina. No further workup at this time. Continue current medication regimen.  Paroxysmal atrial fibrillation Tolerating anticoagulation, maintain normal sinus rhythm elevated CHADS VASC (at least 4) On eliquis BID Discussed bradycardia, may need to decrease propanolol down to 10 mg 3 times daily She will monitor heart rate at home and call us if it runs into the 40s  SOB (shortness of breath) Lasix 40 twice daily every  other day alternating with Lasix 40 Weight loss, conditioning with walking  Total encounter time more than 25 minutes Greater than 50% was spent in counseling and coordination of care with the patient    No orders of the defined types were placed in this encounter.    Signed, Esmond Plants, M.D., Ph.D. 09/03/2020  [facility], [facility] [place], [place] [phone]

## 2020-09-03 ENCOUNTER — Encounter: Payer: Self-pay | Admitting: Cardiovascular Disease

## 2020-09-03 ENCOUNTER — Other Ambulatory Visit: Payer: Self-pay

## 2020-09-03 ENCOUNTER — Ambulatory Visit (INDEPENDENT_AMBULATORY_CARE_PROVIDER_SITE_OTHER): Payer: Medicare Other | Admitting: Cardiovascular Disease

## 2020-09-03 VITALS — BP 142/68 | HR 52 | Ht 60.0 in | Wt 193.5 lb

## 2020-09-03 DIAGNOSIS — I5032 Chronic diastolic (congestive) heart failure: Secondary | ICD-10-CM

## 2020-09-03 DIAGNOSIS — E1159 Type 2 diabetes mellitus with other circulatory complications: Secondary | ICD-10-CM

## 2020-09-03 DIAGNOSIS — I25110 Atherosclerotic heart disease of native coronary artery with unstable angina pectoris: Secondary | ICD-10-CM | POA: Diagnosis not present

## 2020-09-03 DIAGNOSIS — I25118 Atherosclerotic heart disease of native coronary artery with other forms of angina pectoris: Secondary | ICD-10-CM

## 2020-09-03 DIAGNOSIS — E785 Hyperlipidemia, unspecified: Secondary | ICD-10-CM | POA: Diagnosis not present

## 2020-09-03 DIAGNOSIS — I480 Paroxysmal atrial fibrillation: Secondary | ICD-10-CM | POA: Diagnosis not present

## 2020-09-03 DIAGNOSIS — N183 Chronic kidney disease, stage 3 unspecified: Secondary | ICD-10-CM

## 2020-09-03 DIAGNOSIS — I10 Essential (primary) hypertension: Secondary | ICD-10-CM

## 2020-09-03 NOTE — Patient Instructions (Addendum)
Medication Instructions:  Lasix 40 mg twice a day for 1 weeks Then alternate 40 twice a day with 40 mg daily every other day  For heart rates in the 40s, cut the propranolol in 1/2 (10 mg) twice a day  If you need a refill on your cardiac medications before your next appointment, please call your pharmacy.    Lab work: No new labs needed   If you have labs (blood work) drawn today and your tests are completely normal, you will receive your results only by: Marland Kitchen MyChart Message (if you have MyChart) OR . A paper copy in the mail If you have any lab test that is abnormal or we need to change your treatment, we will call you to review the results.   Testing/Procedures: No new testing needed   Follow-Up: At [facility], you and your health needs are our priority.  As part of our continuing mission to provide you with exceptional heart care, we have created designated Provider Care Teams.  These Care Teams include your primary Cardiologist (physician) and Advanced Practice Providers (APPs -  Physician Assistants and Nurse Practitioners) who all work together to provide you with the care you need, when you need it.  . You will need a follow up appointment in 6 months  . Providers on your designated Care Team:   . Murray Hodgkins, NP . Christell Faith, PA-C . Marrianne Mood, PA-C  Any Other Special Instructions Will Be Listed Below (If Applicable).  COVID-19 Vaccine Information can be found at: ShippingScam.co.uk For questions related to vaccine distribution or appointments, please email [email or web address] or call [phone].

## 2020-09-07 ENCOUNTER — Telehealth: Payer: Self-pay | Admitting: Cardiovascular Disease

## 2020-09-07 NOTE — Telephone Encounter (Signed)
Patient wants to now if she should take her 2nd booster for covid, concerns are her health & current medications, please advise.

## 2020-09-09 NOTE — Telephone Encounter (Signed)
Should be fine to take second booster

## 2020-09-10 NOTE — Telephone Encounter (Signed)
I called and spoke with the patient regarding Dr. Donivan Scull response that she may proceed with her 2nd COVID booster.   The patient voices understanding and is agreeable.

## 2020-09-29 ENCOUNTER — Other Ambulatory Visit: Payer: Self-pay | Admitting: Cardiovascular Disease

## 2020-10-04 ENCOUNTER — Emergency Department: Payer: Medicare Other

## 2020-10-04 ENCOUNTER — Other Ambulatory Visit: Payer: Self-pay

## 2020-10-04 ENCOUNTER — Emergency Department
Admission: EM | Admit: 2020-10-04 | Discharge: 2020-10-05 | Disposition: A | Discharge status: Home / Self Care | Payer: Medicare Other | Attending: Emergency Medicine | Admitting: Emergency Medicine

## 2020-10-04 DIAGNOSIS — Z7901 Long term (current) use of anticoagulants: Secondary | ICD-10-CM | POA: Diagnosis not present

## 2020-10-04 DIAGNOSIS — R112 Nausea with vomiting, unspecified: Secondary | ICD-10-CM | POA: Insufficient documentation

## 2020-10-04 DIAGNOSIS — R0902 Hypoxemia: Secondary | ICD-10-CM | POA: Diagnosis not present

## 2020-10-04 DIAGNOSIS — I10 Essential (primary) hypertension: Secondary | ICD-10-CM | POA: Diagnosis not present

## 2020-10-04 DIAGNOSIS — E1169 Type 2 diabetes mellitus with other specified complication: Secondary | ICD-10-CM | POA: Insufficient documentation

## 2020-10-04 DIAGNOSIS — Z79899 Other long term (current) drug therapy: Secondary | ICD-10-CM | POA: Insufficient documentation

## 2020-10-04 DIAGNOSIS — N183 Chronic kidney disease, stage 3 unspecified: Secondary | ICD-10-CM | POA: Insufficient documentation

## 2020-10-04 DIAGNOSIS — I5030 Unspecified diastolic (congestive) heart failure: Secondary | ICD-10-CM | POA: Insufficient documentation

## 2020-10-04 DIAGNOSIS — R1111 Vomiting without nausea: Secondary | ICD-10-CM | POA: Diagnosis not present

## 2020-10-04 DIAGNOSIS — M79661 Pain in right lower leg: Secondary | ICD-10-CM | POA: Insufficient documentation

## 2020-10-04 DIAGNOSIS — R110 Nausea: Secondary | ICD-10-CM | POA: Diagnosis not present

## 2020-10-04 DIAGNOSIS — Z955 Presence of coronary angioplasty implant and graft: Secondary | ICD-10-CM | POA: Insufficient documentation

## 2020-10-04 DIAGNOSIS — I25110 Atherosclerotic heart disease of native coronary artery with unstable angina pectoris: Secondary | ICD-10-CM | POA: Insufficient documentation

## 2020-10-04 DIAGNOSIS — E785 Hyperlipidemia, unspecified: Secondary | ICD-10-CM | POA: Diagnosis not present

## 2020-10-04 DIAGNOSIS — R531 Weakness: Secondary | ICD-10-CM | POA: Insufficient documentation

## 2020-10-04 DIAGNOSIS — E1122 Type 2 diabetes mellitus with diabetic chronic kidney disease: Secondary | ICD-10-CM | POA: Diagnosis not present

## 2020-10-04 DIAGNOSIS — M25561 Pain in right knee: Secondary | ICD-10-CM | POA: Diagnosis not present

## 2020-10-04 DIAGNOSIS — I130 Hypertensive heart and chronic kidney disease with heart failure and stage 1 through stage 4 chronic kidney disease, or unspecified chronic kidney disease: Secondary | ICD-10-CM | POA: Diagnosis not present

## 2020-10-04 DIAGNOSIS — R0602 Shortness of breath: Secondary | ICD-10-CM | POA: Diagnosis not present

## 2020-10-04 DIAGNOSIS — Z7982 Long term (current) use of aspirin: Secondary | ICD-10-CM | POA: Diagnosis not present

## 2020-10-04 DIAGNOSIS — M79604 Pain in right leg: Secondary | ICD-10-CM

## 2020-10-04 LAB — CBC
HCT: 37.7 % (ref 36.0–46.0)
Hemoglobin: 12.6 g/dL (ref 12.0–15.0)
MCH: 32.5 pg (ref 26.0–34.0)
MCHC: 33.4 g/dL (ref 30.0–36.0)
MCV: 97.2 fL (ref 80.0–100.0)
Platelets: 194 10*3/uL (ref 150–400)
RBC: 3.88 MIL/uL (ref 3.87–5.11)
RDW: 13 % (ref 11.5–15.5)
WBC: 9.2 10*3/uL (ref 4.0–10.5)
nRBC: 0 % (ref 0.0–0.2)

## 2020-10-04 LAB — COMPREHENSIVE METABOLIC PANEL
ALT: 42 U/L (ref 0–44)
AST: 65 U/L — ABNORMAL HIGH (ref 15–41)
Albumin: 3.3 g/dL — ABNORMAL LOW (ref 3.5–5.0)
Alkaline Phosphatase: 68 U/L (ref 38–126)
Anion gap: 9 (ref 5–15)
BUN: 25 mg/dL — ABNORMAL HIGH (ref 8–23)
CO2: 23 mmol/L (ref 22–32)
Calcium: 8.2 mg/dL — ABNORMAL LOW (ref 8.9–10.3)
Chloride: 106 mmol/L (ref 98–111)
Creatinine, Ser: 1.46 mg/dL — ABNORMAL HIGH (ref 0.44–1.00)
GFR, Estimated: 36 mL/min — ABNORMAL LOW (ref >60)
Glucose, Bld: 164 mg/dL — ABNORMAL HIGH (ref 70–99)
Potassium: 3.4 mmol/L — ABNORMAL LOW (ref 3.5–5.1)
Sodium: 138 mmol/L (ref 135–145)
Total Bilirubin: 0.6 mg/dL (ref 0.3–1.2)
Total Protein: 6.1 g/dL — ABNORMAL LOW (ref 6.5–8.1)

## 2020-10-04 LAB — TROPONIN I (HIGH SENSITIVITY): Troponin I (High Sensitivity): 12 ng/L (ref <18)

## 2020-10-04 MED ORDER — SODIUM CHLORIDE 0.9 % IV BOLUS
1000.0000 mL | Freq: Once | INTRAVENOUS | Status: AC
  Administered 2020-10-04: 1000 mL via INTRAVENOUS

## 2020-10-04 MED ORDER — ONDANSETRON HCL 4 MG/2ML IJ SOLN: 4.0000 mg | Freq: Once | INTRAMUSCULAR | Status: DC

## 2020-10-04 NOTE — ED Provider Notes (Signed)
[facility] Emergency Department Provider Note  Time seen: 8:50 PM  I have reviewed the triage vital signs and the nursing notes.   HISTORY  Chief Complaint Nausea vomiting, right leg pain  HPI April [name] is a 80 y.o. female with a past medical history of CAD, CKD, gastric reflux, hypertension, hyperlipidemia, presents to the emergency department for weakness nausea vomiting and right leg pain.  According to the patient and EMS report patient states she got onto the bus going to [place] facility.  States she began feeling unwell and nauseous.  States she got off the bus feels like she may have "twisted" her right leg patient vomited multiple times large-volume.  EMS arrived states the patient was diaphoretic appearing and was complaining of mild shortness of breath.  Patient denies any chest pain at any point.  Denies any numbness or weakness of any arm or leg confusion or slurred speech.  Patient states she is feeling much better currently.  Patient does state some pain in her right leg but only when she ambulates.  Denies any currently.  Past Medical History:  Diagnosis Date  . CAD S/P percutaneous coronary angioplasty    a. 07/2015 MV: No ischemia, EF 66%;  b. 03/2016 Inflat STEMI/PCI: LM nl, LAD 25d, RI nl, LCX nl, OM1/2 nl, RCA 80p (3.5x16 Promus Premier DES), 50p/m, 30d, RPDA 90 (2.5x12 Promu Premier DES); c. 01/2018 Cath ([place], [place]): Patent RCA stents->Med Rx.  . CKD (chronic kidney disease), stage III ([place])   . Diastolic dysfunction    a. 10/2013 Echo: EF 55-65%, Gr1 DD; b. 01/2018 Echo ([place], [place]): EF 55-60%, Gr2 DD, RVSP 59mmHg.  . Diverticulitis   . Dyspnea   . Endometriosis   . Family history of adverse reaction to anesthesia    Mother - had to stay in ICU because of breathing difficulties every time she had anesthesia  . GERD (gastroesophageal reflux disease)   . Hiatal hernia   . History of colon  polyps 08/1994  . History of Migraines   . Hypercholesterolemia   . Hypertension   . LBBB (left bundle branch block)   . Osteoporosis   . PAF (paroxysmal atrial fibrillation) ([place])    a. 03/2016 Dx @ time of MI; b. CHA2DS2VASc = 5-->Eliquis.  . Rheumatic fever   . Sleep apnea    CPAP  . Spasmodic dysphonia   . ST elevation myocardial infarction (STEMI) of inferolateral wall, initial episode of care ([place]) 03/25/2016  . Urine incontinence     Patient Active Problem List   Diagnosis Date Noted  . Breast tenderness 07/14/2020  . Abnormal liver function tests 03/11/2020  . Breast pain 12/15/2018  . Lymphedema 07/09/2018  . Chronic heart failure with preserved ejection fraction ([place]) 06/03/2018  . CKD (chronic kidney disease), stage III ([place]) 06/03/2018  . Lower extremity edema 01/19/2018  . Arthropathy of lumbar facet joint 08/08/2017  . Degeneration of lumbar intervertebral disc 08/08/2017  . Osteoarthritis of knee 08/08/2017  . 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  . Paroxysmal atrial fibrillation ([place]) 03/27/2016  . ST elevation myocardial infarction (STEMI) of inferolateral wall, initial episode of care ([place]) 03/25/2016  . CAD S/P PCI pRCA Promus DES 3.5 x 16 (4.1 mm), ostRPDA Promus DES 2.5 x 12 (2.7 mm) 03/25/2016  . Right knee pain 11/13/2015  . Coronary artery disease involving native coronary artery of native heart with unstable angina pectoris ([place]) 06/01/2015  . Diabetes mellitus ([place]) 05/16/2015  .  Gastritis 04/01/2015  . Chest pain 04/01/2015  . Hiatal hernia 08/25/2014  . DOE (dyspnea on exertion) 07/15/2014  . Congestion of throat 07/06/2014  . Health care maintenance 07/06/2014  . Fibrocystic breast disease 07/06/2014  . BMI 38.0-38.9,adult 04/19/2014  . Stress 02/16/2013  . Rib pain on right side 02/16/2013  . SOB (shortness of breath) 10/13/2012  . GERD (gastroesophageal reflux disease) 10/13/2012  . Essential hypertension, benign 08/18/2012  . Hypercholesterolemia  08/18/2012  . History of colonic polyps 08/18/2012  . Obstructive sleep apnea 08/18/2012  . Osteoporosis 08/18/2012    Past Surgical History:  Procedure Laterality Date  . ABDOMINAL HYSTERECTOMY     ovaries not removed  . APPENDECTOMY     was removed during hysterectomy  . Breast biopsies     x2  . BREAST EXCISIONAL BIOPSY Bilateral "years ago"   neg  . BREAST SURGERY Bilateral   . BROW LIFT Bilateral 08/15/2019   Procedure: BLEPHAROPLASTY UPPER EYELID; W/EXCESS SKIN BLEPHAROPTOSIS REPAIR; RESECT EX;  Surgeon: Karle Starch, MD;  Location: [facility];  Service: Ophthalmology;  Laterality: Bilateral;  sleep apnea  . CARDIAC CATHETERIZATION  2011   moderate 40% RCA disease  . CARDIAC CATHETERIZATION  01/2010   Dr. Gollan@[email or web address] : Only noted 40% RCA  . CARDIAC CATHETERIZATION N/A 03/25/2016   Procedure: Left Heart Cath and Coronary Angiography;  Surgeon: 04/07/2016, MD;  Location: [facility];  Service: Cardiovascular;  Laterality: N/A;  . CARDIAC CATHETERIZATION N/A 03/25/2016   Procedure: Coronary Stent Intervention;  Surgeon: 04/07/2016, MD;  Location: [place] [facility];  Service: Cardiovascular;  Laterality: N/A;  . COLONOSCOPY  2013  . ESOPHAGOGASTRODUODENOSCOPY (EGD) WITH PROPOFOL N/A 03/17/2015   Procedure: ESOPHAGOGASTRODUODENOSCOPY (EGD) WITH PROPOFOL;  Surgeon: 03/30/2015, MD;  Location: [facility]OSCOPY;  Service: Gastroenterology;  Laterality: N/A;  . NM MYOVIEW ([place] HX)  07/2015   No evidence ischemia or infarction. EF 66%. Low risk  . TONSILLECTOMY    . TRANSTHORACIC ECHOCARDIOGRAM  10/2013   Normal LV size and function. EF 55-65%. GR 1 DD. Otherwise normal.    Prior to Admission medications   Medication Sig Start Date End Date Taking? Authorizing Provider  acetaminophen (TYLENOL) 500 MG tablet Take 500 mg by mouth daily as needed for headache (pain).    [provider]  albuterol (PROVENTIL HFA;VENTOLIN HFA) 108 (90 Base)  MCG/ACT inhaler Inhale 2 puffs into the lungs every 6 (six) hours as needed for wheezing or shortness of breath. 04/22/18   05/05/18, MD  amiodarone (PACERONE) 200 MG tablet TAKE 1 TABLET BY MOUTH DAILY 08/30/20   18/2/22, MD  apixaban (ELIQUIS) 5 MG TABS tablet Take 1 tablet (5 mg total) by mouth 2 (two) times daily. 10/06/19 03/11/20  24/11/21, MD  aspirin EC 81 MG tablet Take 1 tablet (81 mg total) by mouth daily. 06/03/18   15/3/20, MD  Azelastine HCl 137 MCG/SPRAY SOLN USE 1 TO 2 SPRAYS IN EACH NOSTRIL TWICE A DAY AS NEEDED FOR DRAINAGE 01/20/20   02/02/20, MD  cholecalciferol (VITAMIN D) 1000 units tablet Take 1,000 Units by mouth 2 (two) times daily.    [provider]  fish oil-omega-3 fatty acids 1000 MG capsule Take 1 g by mouth 2 (two) times daily.     [provider]  furosemide (LASIX) 40 MG tablet Take 40 mg by mouth 2 (two) times daily.    [provider]  hydrALAZINE (APRESOLINE)  10 MG tablet TAKE ONE TABLET BY MOUTH THREE TIMES A DAY 05/24/20   Minna Merritts, MD  losartan (COZAAR) 100 MG tablet TAKE 1 TABLET BY MOUTH DAILY 12/18/19   Gollan, Kathlene November, MD  Misc Natural Products (OSTEO BI-FLEX JOINT SHIELD PO) Take 1 tablet by mouth 2 (two) times daily.     [provider]  Multiple Vitamin (MULTIVITAMIN WITH MINERALS) TABS tablet Take 0.5 tablets by mouth 2 (two) times daily.    [provider]  nitroGLYCERIN (NITROSTAT) 0.4 MG SL tablet Place 1 tablet (0.4 mg total) under the tongue every 5 (five) minutes as needed. 04/22/18   Minna Merritts, MD  pantoprazole (PROTONIX) 40 MG tablet TAKE 1 TABLET BY MOUTH DAILY 09/29/20   Minna Merritts, MD  Polyethyl Glycol-Propyl Glycol (SYSTANE OP) Place 1 drop into both eyes daily as needed (dry eyes).    [provider]  potassium chloride (KLOR-CON) 10 MEQ tablet TAKE 1 TABLET BY MOUTH TWICE A DAY 08/30/20   Minna Merritts, MD  PRESCRIPTION MEDICATION  Inhale into the lungs at bedtime. CPAP    [provider]  propranolol (INDERAL) 20 MG tablet TAKE ONE TABLET BY MOUTH THREE TIMES A DAY 05/24/20   Minna Merritts, MD  rosuvastatin (CRESTOR) 40 MG tablet TAKE 1 TABLET BY MOUTH DAILY 08/30/20   Minna Merritts, MD  traMADol (ULTRAM) 50 MG tablet Take 1 every 4-6 hours as needed for pain not controlled by Tylenol 08/15/19   Karle Starch, MD    Allergies  Allergen Reactions  . Penicillins Other (See Comments)    Okay to take amoxicillin/(pt does not recall what the reaction to penicillin was (87-59 years old)   . Sulfa Antibiotics Rash    Family History  Problem Relation Age of Onset  . Arthritis Mother   . Stroke Mother   . Hypertension Mother   . Osteoporosis Mother   . Heart disease Father        MI  . Heart attack Father   . Stroke Brother   . Heart attack Sister   . Breast cancer Other        first cousin x 2  . Stroke Daughter   . Heart attack Daughter   . Colon cancer Neg Hx     Social History Social History   Tobacco Use  . Smoking status: Never Smoker  . Smokeless tobacco: Never Used  Vaping Use  . Vaping Use: Never used  Substance Use Topics  . Alcohol use: No    Alcohol/week: 0.0 standard drinks  . Drug use: No    Review of Systems Constitutional: Negative for fever.  Positive for weakness, now resolved Cardiovascular: Negative for chest pain. Respiratory: Mild shortness of breath, now resolved Gastrointestinal: Negative for abdominal pain.  Positive for nausea and vomiting, now resolved Musculoskeletal: States right leg pain with ambulation Skin: Negative for skin complaints  Neurological: Negative for headache All other ROS negative  ____________________________________________   PHYSICAL EXAM:  Constitutional: Alert and oriented. Well appearing and in no distress. Eyes: Normal exam ENT      Head: Normocephalic and atraumatic.      Mouth/Throat: Mucous membranes are  moist. Cardiovascular: Normal rate, regular rhythm.  Respiratory: Normal respiratory effort without tachypnea nor retractions. Breath sounds are clear, without wheeze rales or rhonchi. Gastrointestinal: Soft and nontender. No distention. Musculoskeletal: Nontender with normal range of motion in all extremities. No lower extremity tenderness.  No pain elicited on  my examination. Neurologic:  Normal speech and language. No gross focal neurologic deficits.  Equal grip strength bilaterally.  5/5 strength in all extremities.  No gross deficits. Skin:  Skin is warm, dry and intact.  Psychiatric: Mood and affect are normal. Speech and behavior are normal.   ____________________________________________    EKG  EKG viewed and interpreted by myself shows a normal sinus rhythm at 57 bpm with a widened QRS, normal axis, slight QTC prolongation otherwise normal intervals.  Overall morphology consistent with left bundle branch block.  ____________________________________________   INITIAL IMPRESSION / ASSESSMENT AND PLAN / ED COURSE  Pertinent labs & imaging results that were available during my care of the patient were reviewed by me and considered in my medical decision making (see chart for details).   Patient presents to the emergency department for weakness nausea vomiting now right leg pain.  Patient states she is feeling very weak and nauseous became diaphoretic vomited multiple times.  Denies any chest pain but does state mild shortness of breath.  We will check labs including cardiac enzymes.  Patient denies any chest pain or abdominal pain at any point.  Has a benign abdominal exam.  Patient states she is having some pain in her right leg which feels like she may have "twisted" the leg while she was getting off the bus.  No weakness on my exam, no numbness.  No pain elicited with range of motion however the patient states it was only when she was attempting to walk.  We will IV hydrate, treat  nausea although the patient states she is feeling much better after vomiting.  Lab work has resulted showing mild renal insufficiency compared to baseline otherwise largely nonrevealing.  Patient continues to feel well.  Patient is receiving IV fluids.  Initial troponin negative we will obtain a 2-hour repeat troponin as a precaution.  NATALIYA GRAIG was evaluated in Emergency Department on 10/04/2020 for the symptoms described in the history of present illness. She was evaluated in the context of the global COVID-19 pandemic, which necessitated consideration that the patient might be at risk for infection with the SARS-CoV-2 virus that causes COVID-19. Institutional protocols and algorithms that pertain to the evaluation of patients at risk for COVID-19 are in a state of rapid change based on information released by regulatory bodies including the CDC and federal and state organizations. These policies and algorithms were followed during the patient's care in the ED.  ____________________________________________   FINAL CLINICAL IMPRESSION(S) / ED DIAGNOSES  Weakness nausea vomiting Musculoskeletal pain   Harvest Dark, MD 10/05/20 1944

## 2020-10-04 NOTE — ED Triage Notes (Addendum)
Pt c/o pain in R posterior knee that started at [place]. Pt reports she was trying to get on a bus and that both legs were weak when trying to climb the steps. Pt reports baseline weakness in the L leg r/t nerve problems,  but that R leg weakness was new tonight.   Pt vomited while en route with EMS. Denies headache.

## 2020-10-04 NOTE — Discharge Instructions (Addendum)
Your knee x-ray and blood tests are all okay.  Please follow-up with your doctor for continued monitoring of your symptoms.

## 2020-10-05 ENCOUNTER — Telehealth: Payer: Self-pay

## 2020-10-05 LAB — TROPONIN I (HIGH SENSITIVITY): Troponin I (High Sensitivity): 17 ng/L (ref <18)

## 2020-10-05 NOTE — ED Notes (Signed)
Pt ambulated using cane with steady gait to restroom by nurses' station. Pt sts R knee is no longer having pain with ambulation. Pt sts she is feeling better at this time. Pt sts she is also able to take home BP medications that were missed this evening. Dr Joni Fears notified and approved for d/c home.

## 2020-10-05 NOTE — Telephone Encounter (Signed)
Pt called and states that she was in the ED yesterday. She states that she thought she was having a heart attack but the ED told her that she was fine. Pt states that she has had bronchitis for three weeks and she thinks the antibiotics are making her sick to her stomach. She states that she is dry heaving and stomach feels bad. They did not test her for covid in the ED according to her. I offered her a telephone appt with Dr Aundra Dubin for a ed follow up and she denied it. Please advise

## 2020-10-06 NOTE — Telephone Encounter (Signed)
Called patient to try to get her to schedule an ED follow up. Patient declined multiple times and says she is fine. Symptoms have resolved and she says she does not feel that the appointment is necessary. She has an appointment in a couple of weeks with Dr Nicki Reaper and says that she will call before or go to urgent care if she needs anything before then.

## 2020-10-08 ENCOUNTER — Ambulatory Visit: Payer: Medicare Other | Admitting: Internal Medicine

## 2020-10-11 ENCOUNTER — Other Ambulatory Visit: Payer: Medicare Other

## 2020-10-13 ENCOUNTER — Ambulatory Visit: Payer: Medicare Other | Admitting: Internal Medicine

## 2020-10-18 DIAGNOSIS — R748 Abnormal levels of other serum enzymes: Secondary | ICD-10-CM | POA: Diagnosis not present

## 2020-10-20 ENCOUNTER — Encounter: Payer: Self-pay | Admitting: Internal Medicine

## 2020-10-20 ENCOUNTER — Other Ambulatory Visit: Payer: Self-pay

## 2020-10-20 ENCOUNTER — Ambulatory Visit (INDEPENDENT_AMBULATORY_CARE_PROVIDER_SITE_OTHER): Payer: Medicare Other | Admitting: Internal Medicine

## 2020-10-20 VITALS — BP 120/60 | HR 67 | Temp 98.2°F | Ht 61.5 in | Wt 195.0 lb

## 2020-10-20 DIAGNOSIS — I25110 Atherosclerotic heart disease of native coronary artery with unstable angina pectoris: Secondary | ICD-10-CM | POA: Diagnosis not present

## 2020-10-20 DIAGNOSIS — J449 Chronic obstructive pulmonary disease, unspecified: Secondary | ICD-10-CM

## 2020-10-20 DIAGNOSIS — G4733 Obstructive sleep apnea (adult) (pediatric): Secondary | ICD-10-CM

## 2020-10-20 MED ORDER — ADVAIR HFA 230-21 MCG/ACT IN AERO: 2.0000 | INHALATION_SPRAY | Freq: Two times a day (BID) | RESPIRATORY_TRACT | 12 refills | Status: DC

## 2020-10-20 NOTE — Progress Notes (Signed)
MRN# [id number] April [name] Nov 02, 1940   CC: Follow up COPD Follow up OSA   ROV 07/2014 Plan - cont with cpap, severe hiatal hernia, referral to surgery for large hiatal hernia, cont with exercise as tolerated. Has normal pfts (no obs process).   ROV 10/2014: Plan: Normal PFTS, 70mwt with only 372 feet. Most likely deconditioning.  ROV 04/2015 Plan   - continue with CPAP - exercise as tolerated.   - ENT F\U - Cardiology Referral - Breo 2 week trial - patient to call office for rx if needed.   OV 7.25.19  Follow up OSA CPAP 11 cm h20 with AHI 3.5  90% compliance    HPI 80 year old female reestablishing care after 3 years Patient has extensive secondhand smoke exposure which most likely is related to her increased work of breathing and shortness of breath with a diagnosis of COPD I have advised patient that she probably will need additional inhaler therapy for maintenance therapy Will start inhaled steroid and long-acting beta agonist  Regarding her OSA Patient has excellent report excellent compliance 100% for greater than days 100% for greater than 4 hours AHI is reduced to 3.4 She wears full facemask  No exacerbation at this time No evidence of heart failure at this time No evidence or signs of infection at this time No respiratory distress No fevers, chills, nausea, vomiting, diarrhea No evidence of lower extremity edema No evidence hemoptysis   She is also seeing cardiology,Dr. Rockey Situ, +coronary artery disease   Sees ENT for tense vocal cords Also had seen speech therapy Will see neurosurgery for chronic back pain      Review of Systems:  Gen:  Denies  fever, sweats, chills weight loss  HEENT: Denies blurred vision, double vision, ear pain, eye pain, hearing loss, nose bleeds, sore throat Cardiac:  No dizziness, chest pain or heaviness, chest tightness,edema, No JVD Resp:   No cough, -sputum production, +shortness of breath,-wheezing, -hemoptysis,   Gi: Denies swallowing difficulty, stomach pain, nausea or vomiting, diarrhea, constipation, bowel incontinence Other:  All other systems negative   Allergies:  Penicillins and Sulfa antibiotics  Physical Examination:    BP 120/60 (BP Location: Left Arm, Patient Position: Sitting, Cuff Size: Normal)   Pulse 67   Temp 98.2 F (36.8 C) (Oral)   Ht 5' 1.5" (1.562 m)   Wt 195 lb (88.5 kg)   LMP 05/01/1972   SpO2 97%   BMI 36.25 kg/m   VS: BP 120/60 (BP Location: Left Arm, Patient Position: Sitting, Cuff Size: Normal)   Pulse 67   Temp 98.2 F (36.8 C) (Oral)   Ht 5' 1.5" (1.562 m)   Wt 195 lb (88.5 kg)   LMP 05/01/1972   SpO2 97%   BMI 36.25 kg/m     Physical Examination:   General Appearance: No distress  Neuro:without focal findings,  speech normal,  HEENT: PERRLA, EOM intact.   Pulmonary: normal breath sounds, No wheezing.  CardiovascularNormal S1,S2.  No m/r/g.   ALL OTHER ROS ARE NEGATIVE   Assessment and Plan:   80 year old pleasant white female seen today for follow-up assessment for shortness of breath and dyspnea on exertion most likely related to underlying COPD from extensive secondhand smoke exposure along with coronary artery disease in the setting of deconditioned state with underlying sleep apnea    DOE (dyspnea on exertion)/SOB-likely related to underlying reactive airways disease and deconditioned state, also probable underlying COPD I believe that her dyspnea on exertion is multifactorial in nature  due to factors such as: Deconditioning, obstructive sleep apnea, obesity, hiatal hernia, coronary artery disease, cardiac megaly, nasal congestion/allergies, COPD Will assess for exertional hypoxia by obtaining 6-minute walk test   Regarding probable COPD Patient will need to start inhaled corticosteroid and long-acting beta agonist with Advair HFA Continue albuterol as needed  Obesity -recommend significant weight loss -recommend changing  diet  Deconditioned state -Recommend increased daily activity and exercise  OSA Continue CPAP therapy as prescribed Patient use and benefits from therapy She has excellent compliance report     MEDICATION ADJUSTMENTS/LABS AND TESTS ORDERED: Start ADVAIR HFA 230  Check 6 MWT to check for oxygen needs  Continue CPAP as prescribed  CURRENT MEDICATIONS REVIEWED AT LENGTH WITH PATIENT TODAY   Patient/ satisfied with Plan of action and management. All questions answered  Follow up 6 months  Total time care 24 minutes  April [name], M.D.  [facility]  Medical Director [place] Director [facility] Cardio-Pulmonary Department

## 2020-10-20 NOTE — Patient Instructions (Signed)
Start ADVAIR HFA 230

## 2020-10-26 ENCOUNTER — Other Ambulatory Visit: Payer: Medicare Other

## 2020-10-26 ENCOUNTER — Other Ambulatory Visit: Payer: Self-pay

## 2020-10-26 ENCOUNTER — Ambulatory Visit (INDEPENDENT_AMBULATORY_CARE_PROVIDER_SITE_OTHER): Payer: Medicare Other | Admitting: Vascular Surgery

## 2020-10-26 VITALS — BP 163/70 | HR 69 | Ht 60.0 in | Wt 196.0 lb

## 2020-10-26 DIAGNOSIS — L03116 Cellulitis of left lower limb: Secondary | ICD-10-CM

## 2020-10-26 DIAGNOSIS — I890 Lymphedema, not elsewhere classified: Secondary | ICD-10-CM

## 2020-10-26 DIAGNOSIS — I25110 Atherosclerotic heart disease of native coronary artery with unstable angina pectoris: Secondary | ICD-10-CM | POA: Diagnosis not present

## 2020-10-26 NOTE — Progress Notes (Signed)
MRN : [id number]  April [name] is a 80 y.o. (1941-01-08) female who presents with chief complaint of  Chief Complaint  Patient presents with   Follow-up    1 yr U/S   .  History of Present Illness: Patient returns today in follow up of her leg swelling and lymphedema.  It had been doing really well until about 2 weeks ago when she developed increased pain, redness, and swelling in the left leg.  This was very tender to the touch.  It is a little better today than it was a week or so ago, but it still remains red and tender to the touch.  The swelling is prominent.  Right leg swelling is fairly stable.  No fevers or chills.  Current Outpatient Medications  Medication Sig Dispense Refill   acetaminophen (TYLENOL) 500 MG tablet Take 500 mg by mouth daily as needed for headache (pain).     albuterol (PROVENTIL HFA;VENTOLIN HFA) 108 (90 Base) MCG/ACT inhaler Inhale 2 puffs into the lungs every 6 (six) hours as needed for wheezing or shortness of breath. 1 Inhaler 6   amiodarone (PACERONE) 200 MG tablet TAKE 1 TABLET BY MOUTH DAILY 90 tablet 0   aspirin EC 81 MG tablet Take 1 tablet (81 mg total) by mouth daily. 90 tablet 3   Azelastine HCl 137 MCG/SPRAY SOLN USE 1 TO 2 SPRAYS IN EACH NOSTRIL TWICE A DAY AS NEEDED FOR DRAINAGE 90 mL 0   cholecalciferol (VITAMIN D) 1000 units tablet Take 1,000 Units by mouth 2 (two) times daily.     fish oil-omega-3 fatty acids 1000 MG capsule Take 1 g by mouth 2 (two) times daily.      fluticasone-salmeterol (ADVAIR HFA) 230-21 MCG/ACT inhaler Inhale 2 puffs into the lungs 2 (two) times daily. 1 each 12   furosemide (LASIX) 40 MG tablet Take 40 mg by mouth 2 (two) times daily.     hydrALAZINE (APRESOLINE) 10 MG tablet TAKE ONE TABLET BY MOUTH THREE TIMES A DAY 270 tablet 0   losartan (COZAAR) 100 MG tablet TAKE 1 TABLET BY MOUTH DAILY 90 tablet 3   Misc Natural Products (OSTEO BI-FLEX JOINT SHIELD PO) Take 1 tablet by mouth 2 (two) times daily.       Multiple Vitamin (MULTIVITAMIN WITH MINERALS) TABS tablet Take 0.5 tablets by mouth 2 (two) times daily.     nitroGLYCERIN (NITROSTAT) 0.4 MG SL tablet Place 1 tablet (0.4 mg total) under the tongue every 5 (five) minutes as needed. 25 tablet 0   pantoprazole (PROTONIX) 40 MG tablet TAKE 1 TABLET BY MOUTH DAILY 30 tablet 4   Polyethyl Glycol-Propyl Glycol (SYSTANE OP) Place 1 drop into both eyes daily as needed (dry eyes).     potassium chloride (KLOR-CON) 10 MEQ tablet TAKE 1 TABLET BY MOUTH TWICE A DAY 180 tablet 0   PRESCRIPTION MEDICATION Inhale into the lungs at bedtime. CPAP     propranolol (INDERAL) 20 MG tablet TAKE ONE TABLET BY MOUTH THREE TIMES A DAY 270 tablet 0   rosuvastatin (CRESTOR) 40 MG tablet TAKE 1 TABLET BY MOUTH DAILY 90 tablet 0   sucralfate (CARAFATE) 1 g tablet Take 1 g by mouth 2 (two) times daily.     traMADol (ULTRAM) 50 MG tablet Take 1 every 4-6 hours as needed for pain not controlled by Tylenol 6 tablet 0   apixaban (ELIQUIS) 5 MG TABS tablet Take 1 tablet (5 mg total) by mouth 2 (two) times daily. [place]  tablet 1   No current facility-administered medications for this visit.    Past Medical History:  Diagnosis Date   CAD S/P percutaneous coronary angioplasty    a. 07/2015 MV: No ischemia, EF 66%;  b. 03/2016 Inflat STEMI/PCI: LM nl, LAD 25d, RI nl, LCX nl, OM1/2 nl, RCA 80p (3.5x16 Promus Premier DES), 50p/m, 30d, RPDA 90 (2.5x12 Promu Premier DES); c. 01/2018 Cath ([place], [place]): Patent RCA stents->Med Rx.   CKD (chronic kidney disease), stage III ([place])    Diastolic dysfunction    a. 10/2013 Echo: EF 55-65%, Gr1 DD; b. 01/2018 Echo ([place], [place]): EF 55-60%, Gr2 DD, RVSP 49mmHg.   Diverticulitis    Dyspnea    Endometriosis    Family history of adverse reaction to anesthesia    Mother - had to stay in ICU because of breathing difficulties every time she had anesthesia   GERD (gastroesophageal reflux disease)    Hiatal hernia     History of colon polyps 08/1994   History of Migraines    Hypercholesterolemia    Hypertension    LBBB (left bundle branch block)    Osteoporosis    PAF (paroxysmal atrial fibrillation) ([place])    a. 03/2016 Dx @ time of MI; b. CHA2DS2VASc = 5-->Eliquis.   Rheumatic fever    Sleep apnea    CPAP   Spasmodic dysphonia    ST elevation myocardial infarction (STEMI) of inferolateral wall, initial episode of care [facility]) 03/25/2016   Urine incontinence     Past Surgical History:  Procedure Laterality Date   ABDOMINAL HYSTERECTOMY     ovaries not removed   APPENDECTOMY     was removed during hysterectomy   Breast biopsies     x2   BREAST EXCISIONAL BIOPSY Bilateral "years ago"   neg   BREAST SURGERY Bilateral    BROW LIFT Bilateral 08/15/2019   Procedure: BLEPHAROPLASTY UPPER EYELID; W/EXCESS SKIN BLEPHAROPTOSIS REPAIR; RESECT EX;  Surgeon: Karle Starch, MD;  Location: [place];  Service: Ophthalmology;  Laterality: Bilateral;  sleep apnea   CARDIAC CATHETERIZATION  2011   moderate 40% RCA disease   CARDIAC CATHETERIZATION  01/2010   Dr. [email or web address] : Only noted 40% RCA   CARDIAC CATHETERIZATION N/A 03/25/2016   Procedure: Left Heart Cath and Coronary Angiography;  Surgeon: 04/07/2016, MD;  Location: [place] [facility];  Service: Cardiovascular;  Laterality: N/A;   CARDIAC CATHETERIZATION N/A 03/25/2016   Procedure: Coronary Stent Intervention;  Surgeon: 04/07/2016, MD;  Location: [place] [facility];  Service: Cardiovascular;  Laterality: N/A;   COLONOSCOPY  2013   ESOPHAGOGASTRODUODENOSCOPY (EGD) WITH PROPOFOL N/A 03/17/2015   Procedure: ESOPHAGOGASTRODUODENOSCOPY (EGD) WITH PROPOFOL;  Surgeon: 03/30/2015, MD;  Location: [facility] ENDOSCOPY;  Service: Gastroenterology;  Laterality: N/A;   NM MYOVIEW ([place] [facility])  07/2015   No evidence ischemia or infarction. EF 66%. Low risk   TONSILLECTOMY     TRANSTHORACIC ECHOCARDIOGRAM  10/2013   Normal LV size and  function. EF 55-65%. GR 1 DD. Otherwise normal.     Social History   Tobacco Use   Smoking status: Never   Smokeless tobacco: Never  Vaping Use   Vaping Use: Never used  Substance Use Topics   Alcohol use: No    Alcohol/week: 0.0 standard drinks   Drug use: No      Family History  Problem Relation Age of Onset   Arthritis Mother    Stroke Mother  Hypertension Mother    Osteoporosis Mother    Heart disease Father        MI   Heart attack Father    Stroke Brother    Heart attack Sister    Breast cancer Other        first cousin x 2   Stroke Daughter    Heart attack Daughter    Colon cancer Neg Hx      Allergies  Allergen Reactions   Penicillins Other (See Comments)    Okay to take amoxicillin/(pt does not recall what the reaction to penicillin was (55-48 years old)    Sulfa Antibiotics Rash    REVIEW OF SYSTEMS (Negative unless checked)   Constitutional: [] Weight loss  [] Fever  [] Chills Cardiac: [] Chest pain   [] Chest pressure   [x] Palpitations   [] Shortness of breath when laying flat   [] Shortness of breath at rest   [x] Shortness of breath with exertion. Vascular:  [x] Pain in legs with walking   [] Pain in legs at rest   [] Pain in legs when laying flat   [] Claudication   [] Pain in feet when walking  [] Pain in feet at rest  [] Pain in feet when laying flat   [] History of DVT   [] Phlebitis   [x] Swelling in legs   [x] Varicose veins   [] Non-healing ulcers Pulmonary:   [] Uses home oxygen   [] Productive cough   [] Hemoptysis   [] Wheeze  [] COPD   [] Asthma Neurologic:  [] Dizziness  [] Blackouts   [] Seizures   [] History of stroke   [] History of TIA  [] Aphasia   [] Temporary blindness   [] Dysphagia   [] Weakness or numbness in arms   [] Weakness or numbness in legs Musculoskeletal:  [x] Arthritis   [] Joint swelling   [x] Joint pain   [] Low back pain Hematologic:  [] Easy bruising  [] Easy bleeding   [] Hypercoagulable state   [] Anemic  [] Hepatitis Gastrointestinal:  [] Blood in stool    [] Vomiting blood  [x] Gastroesophageal reflux/heartburn   [] Abdominal pain Genitourinary:  [] Chronic kidney disease   [] Difficult urination  [] Frequent urination  [] Burning with urination   [] Hematuria Skin:  [] Rashes   [] Ulcers   [] Wounds Psychological:  [] History of anxiety   []  History of major depression.  Physical Examination  BP (!) 163/70   Pulse 69   Ht 5' (1.524 m)   Wt 196 lb (88.9 kg)   LMP 05/01/1972   BMI 38.28 kg/m  Gen:  WD/WN, NAD Head: [place]/AT, No temporalis wasting. Ear/Nose/Throat: Hearing grossly intact, nares w/o erythema or drainage Eyes: Conjunctiva clear. Sclera non-icteric Neck: Supple.  Trachea midline Pulmonary:  Good air movement, no use of accessory muscles.  Cardiac: RRR, no JVD Vascular:  Vessel Right Left  Radial Palpable Palpable               Musculoskeletal: M/S 5/5 throughout.  No deformity or atrophy. 1-2+ RLE edema, 2-3+ LLE edema. Left leg is erythematous and tender to the touch. Neurologic: Sensation grossly intact in extremities.  Symmetrical.  Speech is fluent.  Psychiatric: Judgment intact, Mood & affect appropriate for pt's clinical situation. Dermatologic: No rashes or ulcers noted.  No cellulitis or open wounds.      Labs Recent Results (from the past 2160 hour(s))  CBC     Status: None   Collection Time: 10/04/20  9:35 PM  Result Value Ref Range   WBC 9.2 4.0 - 10.5 K/uL   RBC 3.88 3.87 - 5.11 MIL/uL   Hemoglobin 12.6 12.0 - 15.0 g/dL   HCT 37.7  36.0 - 46.0 %   MCV 97.2 80.0 - 100.0 fL   MCH 32.5 26.0 - 34.0 pg   MCHC 33.4 30.0 - 36.0 g/dL   RDW 13.0 11.5 - 15.5 %   Platelets 194 150 - 400 K/uL   nRBC 0.0 0.0 - 0.2 %    Comment: Performed at [facility], [address] [place], [place] [address]  Comprehensive metabolic panel     Status: Abnormal   Collection Time: 10/04/20  9:35 PM  Result Value Ref Range   Sodium 138 135 - 145 mmol/L   Potassium 3.4 (L) 3.5 - 5.1 mmol/L   Chloride 106 98 - 111 mmol/L    CO2 23 22 - 32 mmol/L   Glucose, Bld 164 (H) 70 - 99 mg/dL    Comment: Glucose reference range applies only to samples taken after fasting for at least 8 hours.   BUN 25 (H) 8 - 23 mg/dL   Creatinine, Ser 1.46 (H) 0.44 - 1.00 mg/dL   Calcium 8.2 (L) 8.9 - 10.3 mg/dL   Total Protein 6.1 (L) 6.5 - 8.1 g/dL   Albumin 3.3 (L) 3.5 - 5.0 g/dL   AST 65 (H) 15 - 41 U/L   ALT 42 0 - 44 U/L   Alkaline Phosphatase 68 38 - 126 U/L   Total Bilirubin 0.6 0.3 - 1.2 mg/dL   GFR, Estimated 36 (L) >60 mL/min    Comment: (NOTE) Calculated using the CKD-EPI Creatinine Equation (2021)    Anion gap 9 5 - 15    Comment: Performed at [facility], [place], [place] [address]  Troponin I (High Sensitivity)     Status: None   Collection Time: 10/04/20  9:35 PM  Result Value Ref Range   Troponin I (High Sensitivity) 12 <18 ng/L    Comment: (NOTE) Elevated high sensitivity troponin I (hsTnI) values and significant  changes across serial measurements may suggest ACS but many other  chronic and acute conditions are known to elevate hsTnI results.  Refer to the "Links" section for chest pain algorithms and additional  guidance. Performed at [facility], [place], [place] [address]   Troponin I (High Sensitivity)     Status: None   Collection Time: 10/04/20 11:29 PM  Result Value Ref Range   Troponin I (High Sensitivity) 17 <18 ng/L    Comment: (NOTE) Elevated high sensitivity troponin I (hsTnI) values and significant  changes across serial measurements may suggest ACS but many other  chronic and acute conditions are known to elevate hsTnI results.  Refer to the "Links" section for chest pain algorithms and additional  guidance. Performed at [facility], [address], [place], [place] [address]     Radiology DG Knee Complete 4 Views Right  Result Date: 10/04/2020 CLINICAL DATA:  Posterior right knee pain and right leg weakness. EXAM:  RIGHT KNEE - COMPLETE 4+ VIEW COMPARISON:  None. FINDINGS: No evidence of an acute fracture, dislocation, or joint effusion. There is marked severity medial tibiofemoral compartment space narrowing. Soft tissues are unremarkable. IMPRESSION: Degenerative changes involving the medial aspect of the right knee, without an acute osseous abnormality. Electronically Signed   By: Virgina Norfolk M.D.   On: 10/04/2020 23:41    Assessment/Plan Paroxysmal atrial fibrillation (HCC) On anticoagulation and rate controlled. Cardiac issues can worsen lower extremity edema.   Essential hypertension, benign blood pressure control important in reducing the progression of atherosclerotic disease. On appropriate  oral medications.     Diabetes mellitus ([place]) blood glucose control important in reducing the progression of atherosclerotic disease. Also, involved in wound healing. On appropriate medications.     Hypercholesterolemia lipid control important in reducing the progression of atherosclerotic disease. Continue statin therapy  Cellulitis of leg, left Prescription was given for 10 days of Keflex today.  I do believe she still has some cellulitis in the left leg.  Recheck in a few weeks.  Lymphedema Symptoms are fairly stable but with the cellulitis in the left leg, her left leg symptoms are worse.  Antibiotics given today.  Continue compression and elevation.  We will be seeing her back in a few weeks.    Leotis Pain, MD  10/26/2020 4:18 PM    This note was created with Dragon medical transcription system.  Any errors from dictation are purely unintentional

## 2020-10-26 NOTE — Assessment & Plan Note (Signed)
Symptoms are fairly stable but with the cellulitis in the left leg, her left leg symptoms are worse.  Antibiotics given today.  Continue compression and elevation.  We will be seeing her back in a few weeks.

## 2020-10-26 NOTE — Assessment & Plan Note (Signed)
Prescription was given for 10 days of Keflex today.  I do believe she still has some cellulitis in the left leg.  Recheck in a few weeks.

## 2020-10-28 ENCOUNTER — Ambulatory Visit (INDEPENDENT_AMBULATORY_CARE_PROVIDER_SITE_OTHER): Payer: Medicare Other | Admitting: Internal Medicine

## 2020-10-28 ENCOUNTER — Other Ambulatory Visit: Payer: Self-pay

## 2020-10-28 ENCOUNTER — Encounter: Payer: Self-pay | Admitting: Internal Medicine

## 2020-10-28 VITALS — BP 140/78 | HR 60 | Temp 98.4°F | Ht 60.0 in | Wt 196.8 lb

## 2020-10-28 DIAGNOSIS — I480 Paroxysmal atrial fibrillation: Secondary | ICD-10-CM | POA: Diagnosis not present

## 2020-10-28 DIAGNOSIS — E7800 Pure hypercholesterolemia, unspecified: Secondary | ICD-10-CM | POA: Diagnosis not present

## 2020-10-28 DIAGNOSIS — I25110 Atherosclerotic heart disease of native coronary artery with unstable angina pectoris: Secondary | ICD-10-CM

## 2020-10-28 DIAGNOSIS — F439 Reaction to severe stress, unspecified: Secondary | ICD-10-CM

## 2020-10-28 DIAGNOSIS — G4733 Obstructive sleep apnea (adult) (pediatric): Secondary | ICD-10-CM

## 2020-10-28 DIAGNOSIS — L03116 Cellulitis of left lower limb: Secondary | ICD-10-CM

## 2020-10-28 DIAGNOSIS — R945 Abnormal results of liver function studies: Secondary | ICD-10-CM | POA: Diagnosis not present

## 2020-10-28 DIAGNOSIS — Z86010 Personal history of colonic polyps: Secondary | ICD-10-CM

## 2020-10-28 DIAGNOSIS — I10 Essential (primary) hypertension: Secondary | ICD-10-CM

## 2020-10-28 DIAGNOSIS — R7989 Other specified abnormal findings of blood chemistry: Secondary | ICD-10-CM

## 2020-10-28 DIAGNOSIS — N183 Chronic kidney disease, stage 3 unspecified: Secondary | ICD-10-CM | POA: Diagnosis not present

## 2020-10-28 DIAGNOSIS — E1159 Type 2 diabetes mellitus with other circulatory complications: Secondary | ICD-10-CM | POA: Diagnosis not present

## 2020-10-28 DIAGNOSIS — K219 Gastro-esophageal reflux disease without esophagitis: Secondary | ICD-10-CM | POA: Diagnosis not present

## 2020-10-28 DIAGNOSIS — I890 Lymphedema, not elsewhere classified: Secondary | ICD-10-CM | POA: Diagnosis not present

## 2020-10-28 DIAGNOSIS — I5032 Chronic diastolic (congestive) heart failure: Secondary | ICD-10-CM

## 2020-10-28 DIAGNOSIS — Z20822 Contact with and (suspected) exposure to covid-19: Secondary | ICD-10-CM | POA: Diagnosis not present

## 2020-10-28 LAB — HEPATIC FUNCTION PANEL
ALT: 50 U/L — ABNORMAL HIGH (ref 0–35)
AST: 74 U/L — ABNORMAL HIGH (ref 0–37)
Albumin: 3.8 g/dL (ref 3.5–5.2)
Alkaline Phosphatase: 80 U/L (ref 39–117)
Bilirubin, Direct: 0.2 mg/dL (ref 0.0–0.3)
Total Bilirubin: 0.7 mg/dL (ref 0.2–1.2)
Total Protein: 6.4 g/dL (ref 6.0–8.3)

## 2020-10-28 LAB — BASIC METABOLIC PANEL
BUN: 19 mg/dL (ref 6–23)
CO2: 30 mEq/L (ref 19–32)
Calcium: 9.1 mg/dL (ref 8.4–10.5)
Chloride: 104 mEq/L (ref 96–112)
Creatinine, Ser: 1.21 mg/dL — ABNORMAL HIGH (ref 0.40–1.20)
GFR: 41.88 mL/min — ABNORMAL LOW (ref >60.00)
Glucose, Bld: 86 mg/dL (ref 70–99)
Potassium: 4.7 mEq/L (ref 3.5–5.1)
Sodium: 141 mEq/L (ref 135–145)

## 2020-10-28 LAB — LIPASE: Lipase: 75 U/L — ABNORMAL HIGH (ref 11.0–59.0)

## 2020-10-28 NOTE — Progress Notes (Signed)
Patient ID: April [name], female   DOB: 02/21/1941, 80 y.o.   MRN: [id number]   Subjective:    Patient ID: April [name], female    DOB: 04/18/1941, 80 y.o.   MRN: [id number]  HPI This visit occurred during the SARS-CoV-2 public health emergency.  Safety protocols were in place, including screening questions prior to the visit, additional usage of staff PPE, and extensive cleaning of exam room while observing appropriate contact time as indicated for disinfecting solutions.   Patient here for a scheduled follow up.  Here to follow up regarding her cholesterol, blood pressure and lower extremity swelling.  Saw Dr Lucky Cowboy 10/26/20.  Found to have cellulitis.  Placed on keflex.  She reports her legs are doing better.  Decreased swelling and redness.  Saw pulmonary 10/20/20 - recommended continuing cpap.  Recommended starting advair.  Plan for 6 minute walk.  Still with sob with exertion, but overall stable.  No chest pain.  Eating.  Reflux is better.  No abdominal pain.  Using lymphedema pump.  Saw GI 10/18/20 - elevated LFTs.  Recommended f/u labs and if persistent increase - further w/up.  Of note, cologuard - negative 2020.  Hold on colonoscopy.  Started on carafate.    Past Medical History:  Diagnosis Date   CAD S/P percutaneous coronary angioplasty    a. 07/2015 MV: No ischemia, EF 66%;  b. 03/2016 Inflat STEMI/PCI: LM nl, LAD 25d, RI nl, LCX nl, OM1/2 nl, RCA 80p (3.5x16 Promus Premier DES), 50p/m, 30d, RPDA 90 (2.5x12 Promu Premier DES); c. 01/2018 Cath ([place], [place]): Patent RCA stents->Med Rx.   CKD (chronic kidney disease), stage III ([place])    Diastolic dysfunction    a. 10/2013 Echo: EF 55-65%, Gr1 DD; b. 01/2018 Echo ([place], [place]): EF 55-60%, Gr2 DD, RVSP 37mHg.   Diverticulitis    Dyspnea    Endometriosis    Family history of adverse reaction to anesthesia    Mother - had to stay in ICU because of breathing difficulties every time she had anesthesia   GERD  (gastroesophageal reflux disease)    Hiatal hernia    History of colon polyps 08/1994   History of Migraines    Hypercholesterolemia    Hypertension    LBBB (left bundle branch block)    Osteoporosis    PAF (paroxysmal atrial fibrillation) ([place]    a. 03/2016 Dx @ time of MI; b. CHA2DS2VASc = 5-->Eliquis.   Rheumatic fever    Sleep apnea    CPAP   Spasmodic dysphonia    ST elevation myocardial infarction (STEMI) of inferolateral wall, initial episode of care ([facility] 03/25/2016   Urine incontinence    Past Surgical History:  Procedure Laterality Date   ABDOMINAL HYSTERECTOMY     ovaries not removed   APPENDECTOMY     was removed during hysterectomy   Breast biopsies     x2   BREAST EXCISIONAL BIOPSY Bilateral "years ago"   neg   BREAST SURGERY Bilateral    BROW LIFT Bilateral 08/15/2019   Procedure: BLEPHAROPLASTY UPPER EYELID; W/EXCESS SKIN BLEPHAROPTOSIS REPAIR; RESECT EX;  Surgeon: FKarle Starch MD;  Location: [facility]  Service: Ophthalmology;  Laterality: Bilateral;  sleep apnea   CARDIAC CATHETERIZATION  2011   moderate 40% RCA disease   CARDIAC CATHETERIZATION  01/2010   Dr. GSuzie Portela: Only noted 40% RCA   CARDIAC CATHETERIZATION N/A 03/25/2016   Procedure: Left Heart Cath and Coronary Angiography;  Surgeon: Leonie Man, MD;  Location: [facility];  Service: Cardiovascular;  Laterality: N/A;   CARDIAC CATHETERIZATION N/A 03/25/2016   Procedure: Coronary Stent Intervention;  Surgeon: Leonie Man, MD;  Location: [facility];  Service: Cardiovascular;  Laterality: N/A;   COLONOSCOPY  2013   ESOPHAGOGASTRODUODENOSCOPY (EGD) WITH PROPOFOL N/A 03/17/2015   Procedure: ESOPHAGOGASTRODUODENOSCOPY (EGD) WITH PROPOFOL;  Surgeon: Christene Lye, MD;  Location: [facility];  Service: Gastroenterology;  Laterality: N/A;   NM MYOVIEW ([facility])  07/2015   No evidence ischemia or infarction. EF 66%. Low risk   TONSILLECTOMY     TRANSTHORACIC  ECHOCARDIOGRAM  10/2013   Normal LV size and function. EF 55-65%. GR 1 DD. Otherwise normal.   Family History  Problem Relation Age of Onset   Arthritis Mother    Stroke Mother    Hypertension Mother    Osteoporosis Mother    Heart disease Father        MI   Heart attack Father    Stroke Brother    Heart attack Sister    Breast cancer Other        first cousin x 2   Stroke Daughter    Heart attack Daughter    Colon cancer Neg Hx    Social History   Socioeconomic History   Marital status: Widowed    Spouse name: Not on file   Number of children: 2   Years of education: Not on file   Highest education level: Not on file  Occupational History   Not on file  Tobacco Use   Smoking status: Never   Smokeless tobacco: Never  Vaping Use   Vaping Use: Never used  Substance and Sexual Activity   Alcohol use: No    Alcohol/week: 0.0 standard drinks   Drug use: No   Sexual activity: Not Currently    Birth control/protection: Post-menopausal  Other Topics Concern   Not on file  Social History Narrative   Not on file   Social Determinants of Health   Financial Resource Strain: Not on file  Food Insecurity: Not on file  Transportation Needs: Not on file  Physical Activity: Not on file  Stress: Not on file  Social Connections: Not on file    Review of Systems  Constitutional:  Negative for appetite change and unexpected weight change.  HENT:  Negative for congestion and sinus pressure.   Respiratory:  Negative for cough and chest tightness.        Stable - sob.    Cardiovascular:  Negative for chest pain and palpitations.       Swelling improved with abx.    Gastrointestinal:  Negative for abdominal pain, diarrhea, nausea and vomiting.  Genitourinary:  Negative for difficulty urinating and dysuria.  Musculoskeletal:  Negative for joint swelling and myalgias.  Skin:  Negative for color change.       Redness - lower extremities - improved per pt.   Neurological:   Negative for dizziness, light-headedness and headaches.  Psychiatric/Behavioral:  Negative for agitation and dysphoric mood.       Objective:    Physical Exam Vitals reviewed.  Constitutional:      General: She is not in acute distress.    Appearance: Normal appearance.  HENT:     Head: Normocephalic and atraumatic.     Right Ear: External ear normal.     Left Ear: External ear normal.  Eyes:     General: No scleral  icterus.       Right eye: No discharge.        Left eye: No discharge.     Conjunctiva/sclera: Conjunctivae normal.  Neck:     Thyroid: No thyromegaly.  Cardiovascular:     Rate and Rhythm: Normal rate and regular rhythm.  Pulmonary:     Effort: No respiratory distress.     Breath sounds: Normal breath sounds. No wheezing.  Abdominal:     General: Bowel sounds are normal.     Palpations: Abdomen is soft.     Tenderness: There is no abdominal tenderness.  Musculoskeletal:        General: No tenderness.     Cervical back: Neck supple. No tenderness.     Comments: Lower extremity swelling - (improved with decreased erythema).   Lymphadenopathy:     Cervical: No cervical adenopathy.  Skin:    Findings: No erythema or rash.  Neurological:     Mental Status: She is alert.  Psychiatric:        Mood and Affect: Mood normal.        Behavior: Behavior normal.    BP 140/78   Pulse 60   Temp 98.4 F (36.9 C)   Ht 5' (1.524 m)   Wt 196 lb 12.8 oz (89.3 kg)   LMP 05/01/1972   SpO2 98%   BMI 38.43 kg/m  Wt Readings from Last 3 Encounters:  10/28/20 196 lb 12.8 oz (89.3 kg)  10/26/20 196 lb (88.9 kg)  10/20/20 195 lb (88.5 kg)    Outpatient Encounter Medications as of 10/28/2020  Medication Sig   acetaminophen (TYLENOL) 500 MG tablet Take 500 mg by mouth daily as needed for headache (pain).   albuterol (PROVENTIL HFA;VENTOLIN HFA) 108 (90 Base) MCG/ACT inhaler Inhale 2 puffs into the lungs every 6 (six) hours as needed for wheezing or shortness of breath.    amiodarone (PACERONE) 200 MG tablet TAKE 1 TABLET BY MOUTH DAILY   aspirin EC 81 MG tablet Take 1 tablet (81 mg total) by mouth daily.   Azelastine HCl 137 MCG/SPRAY SOLN USE 1 TO 2 SPRAYS IN EACH NOSTRIL TWICE A DAY AS NEEDED FOR DRAINAGE   cholecalciferol (VITAMIN D) 1000 units tablet Take 1,000 Units by mouth 2 (two) times daily.   fish oil-omega-3 fatty acids 1000 MG capsule Take 1 g by mouth 2 (two) times daily.    fluticasone-salmeterol (ADVAIR HFA) 230-21 MCG/ACT inhaler Inhale 2 puffs into the lungs 2 (two) times daily.   furosemide (LASIX) 40 MG tablet Take 40 mg by mouth 2 (two) times daily.   hydrALAZINE (APRESOLINE) 10 MG tablet TAKE ONE TABLET BY MOUTH THREE TIMES A DAY   losartan (COZAAR) 100 MG tablet TAKE 1 TABLET BY MOUTH DAILY   Misc Natural Products (OSTEO BI-FLEX JOINT SHIELD PO) Take 1 tablet by mouth 2 (two) times daily.    Multiple Vitamin (MULTIVITAMIN WITH MINERALS) TABS tablet Take 0.5 tablets by mouth 2 (two) times daily.   nitroGLYCERIN (NITROSTAT) 0.4 MG SL tablet Place 1 tablet (0.4 mg total) under the tongue every 5 (five) minutes as needed.   pantoprazole (PROTONIX) 40 MG tablet TAKE 1 TABLET BY MOUTH DAILY   Polyethyl Glycol-Propyl Glycol (SYSTANE OP) Place 1 drop into both eyes daily as needed (dry eyes).   potassium chloride (KLOR-CON) 10 MEQ tablet TAKE 1 TABLET BY MOUTH TWICE A DAY   PRESCRIPTION MEDICATION Inhale into the lungs at bedtime. CPAP   propranolol (INDERAL) 20 MG tablet  TAKE ONE TABLET BY MOUTH THREE TIMES A DAY   rosuvastatin (CRESTOR) 40 MG tablet TAKE 1 TABLET BY MOUTH DAILY   sucralfate (CARAFATE) 1 g tablet Take 1 g by mouth 2 (two) times daily.   traMADol (ULTRAM) 50 MG tablet Take 1 every 4-6 hours as needed for pain not controlled by Tylenol   apixaban (ELIQUIS) 5 MG TABS tablet Take 1 tablet (5 mg total) by mouth 2 (two) times daily.   No facility-administered encounter medications on file as of 10/28/2020.     Lab Results   Component Value Date   WBC 9.2 10/04/2020   HGB 12.6 10/04/2020   HCT 37.7 10/04/2020   PLT 194 10/04/2020   GLUCOSE 86 10/28/2020   CHOL 146 04/29/2020   TRIG 87.0 04/29/2020   HDL 63.90 04/29/2020   LDLCALC 65 04/29/2020   ALT 50 (H) 10/28/2020   AST 74 (H) 10/28/2020   NA 141 10/28/2020   K 4.7 10/28/2020   CL 104 10/28/2020   CREATININE 1.21 (H) 10/28/2020   BUN 19 10/28/2020   CO2 30 10/28/2020   TSH 3.40 11/04/2019   INR 0.99 03/25/2016   HGBA1C 6.1 04/29/2020   MICROALBUR <0.7 04/29/2020    DG Knee Complete 4 Views Right  Result Date: 10/04/2020 CLINICAL DATA:  Posterior right knee pain and right leg weakness. EXAM: RIGHT KNEE - COMPLETE 4+ VIEW COMPARISON:  None. FINDINGS: No evidence of an acute fracture, dislocation, or joint effusion. There is marked severity medial tibiofemoral compartment space narrowing. Soft tissues are unremarkable. IMPRESSION: Degenerative changes involving the medial aspect of the right knee, without an acute osseous abnormality. Electronically Signed   By: Virgina Norfolk M.D.   On: 10/04/2020 23:41       Assessment & Plan:   Problem List Items Addressed This Visit     Abnormal liver function tests - Primary    Abnormal liver function tests.  Seeing GI.  Will recheck today - also check lipase.  D/w GI regarding f/u.        Relevant Orders   Hepatic function panel (Completed)   Basic metabolic panel (Completed)   Lipase (Completed)   Cellulitis of leg, left    Taking keflex.  Decreased swelling and erythema.  Follow.  Call with update.        Chronic heart failure with preserved ejection fraction (HCC)    Lower extremity swelling has improved some.  Continue abx.  Breathing stable.  Continue current medication regimen as outlined.        CKD (chronic kidney disease), stage III (HCC)    Avoid antiinflammatories.  Follow metabolic panel.        Coronary artery disease involving native coronary artery of native heart with  unstable angina pectoris (HCC)    No chest pain - continues hydralazine, losartan and eliquis.  Follow.        Diabetes mellitus ([place])    Low carb diet and exercise.  Follow met b and a1c.        GERD (gastroesophageal reflux disease)    No upper symptoms reported.  On protonix.        History of colonic polyps    Just saw GI.  cologuard negative 2020.  Elected to hold f/u colonoscopy.         Lymphedema    Continue lymphedema pump.        Paroxysmal atrial fibrillation (HCC)    On eliquis and amiodarone.  Stable.  Followed by  cardiology.        Stress    Overall appears to be handling things well.  Follow.        Essential hypertension, benign (Chronic)    Continue losartan.  Blood pressure doing well.  Follow pressures.  Follow metabolic panel.        Hypercholesterolemia (Chronic)    On crestor.  Low cholesterol diet and exercise.  Follow lipid panel and liver function tests.         Obstructive sleep apnea (Chronic)    Continue cpap.          Einar Pheasant, MD

## 2020-11-02 ENCOUNTER — Encounter: Payer: Self-pay | Admitting: Internal Medicine

## 2020-11-02 ENCOUNTER — Other Ambulatory Visit: Payer: Self-pay | Admitting: Internal Medicine

## 2020-11-02 NOTE — Assessment & Plan Note (Signed)
Continue cpap.  

## 2020-11-02 NOTE — Assessment & Plan Note (Signed)
Continue lymphedema pump.

## 2020-11-02 NOTE — Assessment & Plan Note (Signed)
Low carb diet and exercise.  Follow met b and a1c.  

## 2020-11-02 NOTE — Assessment & Plan Note (Signed)
Taking keflex.  Decreased swelling and erythema.  Follow.  Call with update.

## 2020-11-02 NOTE — Assessment & Plan Note (Signed)
Continue losartan.  Blood pressure doing well.  Follow pressures.  Follow metabolic panel.  

## 2020-11-02 NOTE — Assessment & Plan Note (Signed)
No chest pain - continues hydralazine, losartan and eliquis.  Follow.

## 2020-11-02 NOTE — Assessment & Plan Note (Signed)
Just saw GI.  cologuard negative 2020.  Elected to hold f/u colonoscopy.

## 2020-11-02 NOTE — Assessment & Plan Note (Signed)
Overall appears to be handling things well.  Follow.  ?

## 2020-11-02 NOTE — Assessment & Plan Note (Signed)
Avoid antiinflammatories.  Follow metabolic panel.  

## 2020-11-02 NOTE — Assessment & Plan Note (Signed)
Lower extremity swelling has improved some.  Continue abx.  Breathing stable.  Continue current medication regimen as outlined.

## 2020-11-02 NOTE — Assessment & Plan Note (Signed)
No upper symptoms reported.  On protonix.   

## 2020-11-02 NOTE — Assessment & Plan Note (Signed)
On eliquis and amiodarone.  Stable.  Followed by cardiology.

## 2020-11-02 NOTE — Assessment & Plan Note (Signed)
Abnormal liver function tests.  Seeing GI.  Will recheck today - also check lipase.  D/w GI regarding f/u.

## 2020-11-02 NOTE — Assessment & Plan Note (Signed)
On crestor.  Low cholesterol diet and exercise.  Follow lipid panel and liver function tests.   

## 2020-11-03 ENCOUNTER — Telehealth: Payer: Self-pay | Admitting: Internal Medicine

## 2020-11-03 DIAGNOSIS — R7989 Other specified abnormal findings of blood chemistry: Secondary | ICD-10-CM

## 2020-11-03 DIAGNOSIS — R945 Abnormal results of liver function studies: Secondary | ICD-10-CM

## 2020-11-03 NOTE — Telephone Encounter (Signed)
Spoke to [facility] and Ms Nickey.  She is feeling better.  Minimal nausea in the am (intermittent).  Some bloating at times.  Will recheck liver panel and lipase in 2 weeks.  Discussed CT scan if persistent issues.  She will notify me if symptoms change or worsen.  This will allow her to complete abx for cellulitis.  Please schedule non fasting lab appt and call her with appt date and time.

## 2020-11-04 NOTE — Telephone Encounter (Signed)
Called and scheduled pt

## 2020-11-07 DIAGNOSIS — Z20822 Contact with and (suspected) exposure to covid-19: Secondary | ICD-10-CM | POA: Diagnosis not present

## 2020-11-16 ENCOUNTER — Encounter (INDEPENDENT_AMBULATORY_CARE_PROVIDER_SITE_OTHER): Payer: Self-pay | Admitting: Nurse Practitioner

## 2020-11-16 ENCOUNTER — Ambulatory Visit (INDEPENDENT_AMBULATORY_CARE_PROVIDER_SITE_OTHER): Payer: Medicare Other | Admitting: Nurse Practitioner

## 2020-11-16 ENCOUNTER — Other Ambulatory Visit: Payer: Self-pay

## 2020-11-16 VITALS — BP 172/85 | HR 57 | Resp 16 | Wt 191.6 lb

## 2020-11-16 DIAGNOSIS — I10 Essential (primary) hypertension: Secondary | ICD-10-CM | POA: Diagnosis not present

## 2020-11-16 DIAGNOSIS — I890 Lymphedema, not elsewhere classified: Secondary | ICD-10-CM

## 2020-11-16 DIAGNOSIS — L03116 Cellulitis of left lower limb: Secondary | ICD-10-CM | POA: Diagnosis not present

## 2020-11-17 ENCOUNTER — Other Ambulatory Visit: Payer: Medicare Other

## 2020-11-17 ENCOUNTER — Other Ambulatory Visit: Payer: Self-pay

## 2020-11-17 ENCOUNTER — Other Ambulatory Visit (INDEPENDENT_AMBULATORY_CARE_PROVIDER_SITE_OTHER): Payer: Medicare Other

## 2020-11-17 DIAGNOSIS — R945 Abnormal results of liver function studies: Secondary | ICD-10-CM

## 2020-11-17 DIAGNOSIS — R7989 Other specified abnormal findings of blood chemistry: Secondary | ICD-10-CM

## 2020-11-17 LAB — HEPATIC FUNCTION PANEL
ALT: 40 U/L — ABNORMAL HIGH (ref 0–35)
AST: 63 U/L — ABNORMAL HIGH (ref 0–37)
Albumin: 3.4 g/dL — ABNORMAL LOW (ref 3.5–5.2)
Alkaline Phosphatase: 77 U/L (ref 39–117)
Bilirubin, Direct: 0.2 mg/dL (ref 0.0–0.3)
Total Bilirubin: 0.6 mg/dL (ref 0.2–1.2)
Total Protein: 5.7 g/dL — ABNORMAL LOW (ref 6.0–8.3)

## 2020-11-17 LAB — LIPASE: Lipase: 79 U/L — ABNORMAL HIGH (ref 11.0–59.0)

## 2020-11-19 ENCOUNTER — Other Ambulatory Visit: Payer: Self-pay | Admitting: Internal Medicine

## 2020-11-19 DIAGNOSIS — R109 Unspecified abdominal pain: Secondary | ICD-10-CM

## 2020-11-19 DIAGNOSIS — R140 Abdominal distension (gaseous): Secondary | ICD-10-CM

## 2020-11-19 NOTE — Progress Notes (Signed)
Order placed for CT abdomen and pelvis without contrast.

## 2020-11-23 ENCOUNTER — Ambulatory Visit (INDEPENDENT_AMBULATORY_CARE_PROVIDER_SITE_OTHER): Payer: Medicare Other | Admitting: Vascular Surgery

## 2020-11-23 ENCOUNTER — Other Ambulatory Visit: Payer: Self-pay

## 2020-11-23 ENCOUNTER — Encounter (INDEPENDENT_AMBULATORY_CARE_PROVIDER_SITE_OTHER): Payer: Self-pay

## 2020-11-23 ENCOUNTER — Encounter (INDEPENDENT_AMBULATORY_CARE_PROVIDER_SITE_OTHER): Payer: Self-pay | Admitting: Nurse Practitioner

## 2020-11-23 ENCOUNTER — Ambulatory Visit (INDEPENDENT_AMBULATORY_CARE_PROVIDER_SITE_OTHER): Payer: Medicare Other | Admitting: Nurse Practitioner

## 2020-11-23 VITALS — BP 160/74 | HR 62 | Resp 16

## 2020-11-23 DIAGNOSIS — L03116 Cellulitis of left lower limb: Secondary | ICD-10-CM

## 2020-11-23 NOTE — Progress Notes (Signed)
History of Present Illness  There is no documented history at this time  Assessments & Plan   There are no diagnoses linked to this encounter.    Additional instructions  Subjective:  Patient presents with venous ulcer of the Bilateral lower extremity.    Procedure:  3 layer unna wrap was placed Bilateral lower extremity.   Plan:   Follow up in one week.  

## 2020-11-23 NOTE — Progress Notes (Signed)
Subjective:    Patient ID: [name], female    DOB: December 14, 1940, 80 y.o.   MRN: [id number] Chief Complaint  Patient presents with  . Follow-up    3 wk cellulitis check for ble    Patient returns today in follow up of her leg swelling and lymphedema.  The patient was placed on antibiotics about 3 weeks ago due to cellulitis.  The leg is better today with less tenderness and redness however the swelling still persists.  It is better than it was previously however.  The patient denies any wounds or ulcerations.  She denies any fevers or chills.    Review of Systems  Cardiovascular:  Positive for leg swelling.  All other systems reviewed and are negative.     Objective:   Physical Exam Vitals reviewed.  HENT:     Head: Normocephalic.  Cardiovascular:     Rate and Rhythm: Normal rate.     Pulses: Normal pulses.  Pulmonary:     Effort: Pulmonary effort is normal.  Musculoskeletal:     Right lower leg: Edema present.     Left lower leg: Edema present.  Neurological:     Mental Status: She is alert and oriented to person, place, and time.  Psychiatric:        Mood and Affect: Mood normal.        Behavior: Behavior normal.        Thought Content: Thought content normal.        Judgment: Judgment normal.    BP (!) 172/85 (BP Location: Right Arm)   Pulse (!) 57   Resp 16   Wt 191 lb 9.6 oz (86.9 kg)   LMP 05/01/1972   BMI 37.42 kg/m   Past Medical History:  Diagnosis Date  . CAD S/P percutaneous coronary angioplasty    a. 07/2015 MV: No ischemia, EF 66%;  b. 03/2016 Inflat STEMI/PCI: LM nl, LAD 25d, RI nl, LCX nl, OM1/2 nl, RCA 80p (3.5x16 Promus Premier DES), 50p/m, 30d, RPDA 90 (2.5x12 Promu Premier DES); c. 01/2018 Cath ([place], [place]): Patent RCA stents->Med Rx.  . CKD (chronic kidney disease), stage III ([place])   . Diastolic dysfunction    a. 10/2013 Echo: EF 55-65%, Gr1 DD; b. 01/2018 Echo ([place], [place]): EF 55-60%, Gr2 DD, RVSP  12mmHg.  . Diverticulitis   . Dyspnea   . Endometriosis   . Family history of adverse reaction to anesthesia    Mother - had to stay in ICU because of breathing difficulties every time she had anesthesia  . GERD (gastroesophageal reflux disease)   . Hiatal hernia   . History of colon polyps 08/1994  . History of Migraines   . Hypercholesterolemia   . Hypertension   . LBBB (left bundle branch block)   . Osteoporosis   . PAF (paroxysmal atrial fibrillation) ([place])    a. 03/2016 Dx @ time of MI; b. CHA2DS2VASc = 5-->Eliquis.  . Rheumatic fever   . Sleep apnea    CPAP  . Spasmodic dysphonia   . ST elevation myocardial infarction (STEMI) of inferolateral wall, initial episode of care ([place]) 03/25/2016  . Urine incontinence     Social History   Socioeconomic History  . Marital status: Widowed    Spouse name: Not on file  . Number of children: 2  . Years of education: Not on file  . Highest education level: Not on file  Occupational History  . Not on  file  Tobacco Use  . Smoking status: Never  . Smokeless tobacco: Never  Vaping Use  . Vaping Use: Never used  Substance and Sexual Activity  . Alcohol use: No    Alcohol/week: 0.0 standard drinks  . Drug use: No  . Sexual activity: Not Currently    Birth control/protection: Post-menopausal  Other Topics Concern  . Not on file  Social History Narrative  . Not on file   Social Determinants of Health   Financial Resource Strain: Not on file  Food Insecurity: Not on file  Transportation Needs: Not on file  Physical Activity: Not on file  Stress: Not on file  Social Connections: Not on file  Intimate Partner Violence: Not on file    Past Surgical History:  Procedure Laterality Date  . ABDOMINAL HYSTERECTOMY     ovaries not removed  . APPENDECTOMY     was removed during hysterectomy  . Breast biopsies     x2  . BREAST EXCISIONAL BIOPSY Bilateral "years ago"   neg  . BREAST SURGERY Bilateral   . BROW LIFT Bilateral  08/15/2019   Procedure: BLEPHAROPLASTY UPPER EYELID; W/EXCESS SKIN BLEPHAROPTOSIS REPAIR; RESECT EX;  Surgeon: Karle Starch, MD;  Location: [facility];  Service: Ophthalmology;  Laterality: Bilateral;  sleep apnea  . CARDIAC CATHETERIZATION  2011   moderate 40% RCA disease  . CARDIAC CATHETERIZATION  01/2010   Dr. Gollan@[facility] : Only noted 40% RCA  . CARDIAC CATHETERIZATION N/A 03/25/2016   Procedure: Left Heart Cath and Coronary Angiography;  Surgeon: 04/07/2016, MD;  Location: [facility];  Service: Cardiovascular;  Laterality: N/A;  . CARDIAC CATHETERIZATION N/A 03/25/2016   Procedure: Coronary Stent Intervention;  Surgeon: 04/07/2016, MD;  [facility];  Service: Cardiovascular;  Laterality: N/A;  . COLONOSCOPY  2013  . ESOPHAGOGASTRODUODENOSCOPY (EGD) WITH PROPOFOL N/A 03/17/2015   Procedure: ESOPHAGOGASTRODUODENOSCOPY (EGD) WITH PROPOFOL;  Surgeon: 03/30/2015, MD;  Location: [facility];  Service: Gastroenterology;  Laterality: N/A;  . NM MYOVIEW ([place] HX)  07/2015   No evidence ischemia or infarction. EF 66%. Low risk  . TONSILLECTOMY    . TRANSTHORACIC ECHOCARDIOGRAM  10/2013   Normal LV size and function. EF 55-65%. GR 1 DD. Otherwise normal.    Family History  Problem Relation Age of Onset  . Arthritis Mother   . Stroke Mother   . Hypertension Mother   . Osteoporosis Mother   . Heart disease Father        MI  . Heart attack Father   . Stroke Brother   . Heart attack Sister   . Breast cancer Other        first cousin x 2  . Stroke Daughter   . Heart attack Daughter   . Colon cancer Neg Hx     Allergies  Allergen Reactions  . Penicillins Other (See Comments)    Okay to take amoxicillin/(pt does not recall what the reaction to penicillin was (75-8 years old)   . Sulfa Antibiotics Rash    CBC Latest Ref Rng & Units 10/04/2020 11/04/2019 01/10/2019  WBC 4.0 - 10.5 K/uL 9.2 5.5 6.1  Hemoglobin 12.0 - 15.0 g/dL 12.6  12.4 13.4  Hematocrit 36.0 - 46.0 % 37.7 37.5 40.6  Platelets 150 - 400 K/uL 194 227.0 200.0      CMP     Component Value Date/Time   NA 141 10/28/2020 1159   NA 147 (H) 04/29/2018 1058  K 4.7 10/28/2020 1159   K 3.9 03/23/2014 1617   CL 104 10/28/2020 1159   CO2 30 10/28/2020 1159   GLUCOSE 86 10/28/2020 1159   BUN 19 10/28/2020 1159   BUN 26 04/29/2018 1058   CREATININE 1.21 (H) 10/28/2020 1159   CALCIUM 9.1 10/28/2020 1159   PROT 5.7 (L) 11/17/2020 1033   ALBUMIN 3.4 (L) 11/17/2020 1033   AST 63 (H) 11/17/2020 1033   ALT 40 (H) 11/17/2020 1033   ALKPHOS 77 11/17/2020 1033   BILITOT 0.6 11/17/2020 1033   GFRNONAA 36 (L) 10/04/2020 2135   GFRAA 42 (L) 04/29/2018 1058     No results found.     Assessment & Plan:   1. Cellulitis of leg, left This has resolved.  However she continues to have swelling as addressed below.  2. Lymphedema No surgery or intervention at this point in time.    I have had a long discussion with the patient regarding venous insufficiency and why it  causes symptoms, specifically venous ulceration . I have discussed with the patient the chronic skin changes that accompany venous insufficiency and the long term sequela such as infection and recurring  ulceration.  Patient will be placed in [employer] which will be changed weekly drainage permitting.  In addition, behavioral modification including several periods of elevation of the lower extremities during the day will be continued. Achieving a position with the ankles at heart level was stressed to the patient  The patient is instructed to begin routine exercise, especially walking on a daily basis Patient will return in 4 weeks to evaluate the amount of swelling she continues to have after 4 weeks of Unna wraps.  3. Essential hypertension, benign Continue antihypertensive medications as already ordered, these medications have been reviewed and there are no changes at this time.    Current  Outpatient Medications on File Prior to Visit  Medication Sig Dispense Refill  . acetaminophen (TYLENOL) 500 MG tablet Take 500 mg by mouth daily as needed for headache (pain).    Marland Kitchen albuterol (PROVENTIL HFA;VENTOLIN HFA) 108 (90 Base) MCG/ACT inhaler Inhale 2 puffs into the lungs every 6 (six) hours as needed for wheezing or shortness of breath. 1 Inhaler 6  . amiodarone (PACERONE) 200 MG tablet TAKE 1 TABLET BY MOUTH DAILY 90 tablet 0  . aspirin EC 81 MG tablet Take 1 tablet (81 mg total) by mouth daily. 90 tablet 3  . Azelastine HCl 137 MCG/SPRAY SOLN USE 1 TO 2 SPRAYS IN EACH NOSTRIL TWICE A DAY AS NEEDED FOR DRAINAGE 90 mL 0  . cholecalciferol (VITAMIN D) 1000 units tablet Take 1,000 Units by mouth 2 (two) times daily.    Marland Kitchen ELIQUIS 5 MG TABS tablet TAKE 1 TABLET BY MOUTH TWICE A DAY 180 tablet 1  . fish oil-omega-3 fatty acids 1000 MG capsule Take 1 g by mouth 2 (two) times daily.     . fluticasone-salmeterol (ADVAIR HFA) 230-21 MCG/ACT inhaler Inhale 2 puffs into the lungs 2 (two) times daily. 1 each 12  . furosemide (LASIX) 40 MG tablet Take 40 mg by mouth 2 (two) times daily.    . hydrALAZINE (APRESOLINE) 10 MG tablet TAKE ONE TABLET BY MOUTH THREE TIMES A DAY 270 tablet 0  . losartan (COZAAR) 100 MG tablet TAKE 1 TABLET BY MOUTH DAILY 90 tablet 3  . Misc Natural Products (OSTEO BI-FLEX JOINT SHIELD PO) Take 1 tablet by mouth 2 (two) times daily.     . Multiple  Vitamin (MULTIVITAMIN WITH MINERALS) TABS tablet Take 0.5 tablets by mouth 2 (two) times daily.    . nitroGLYCERIN (NITROSTAT) 0.4 MG SL tablet Place 1 tablet (0.4 mg total) under the tongue every 5 (five) minutes as needed. 25 tablet 0  . pantoprazole (PROTONIX) 40 MG tablet TAKE 1 TABLET BY MOUTH DAILY 30 tablet 4  . Polyethyl Glycol-Propyl Glycol (SYSTANE OP) Place 1 drop into both eyes daily as needed (dry eyes).    . potassium chloride (KLOR-CON) 10 MEQ tablet TAKE 1 TABLET BY MOUTH TWICE A DAY 180 tablet 0  . PRESCRIPTION  MEDICATION Inhale into the lungs at bedtime. CPAP    . propranolol (INDERAL) 20 MG tablet TAKE ONE TABLET BY MOUTH THREE TIMES A DAY 270 tablet 0  . rosuvastatin (CRESTOR) 40 MG tablet TAKE 1 TABLET BY MOUTH DAILY 90 tablet 0  . sucralfate (CARAFATE) 1 g tablet Take 1 g by mouth 2 (two) times daily.    . traMADol (ULTRAM) 50 MG tablet Take 1 every 4-6 hours as needed for pain not controlled by Tylenol 6 tablet 0   No current facility-administered medications on file prior to visit.    There are no Patient Instructions on file for this visit. No follow-ups on file.   Kris Hartmann, NP

## 2020-11-29 ENCOUNTER — Other Ambulatory Visit: Payer: Self-pay

## 2020-11-29 ENCOUNTER — Ambulatory Visit
Admission: RE | Admit: 2020-11-29 | Discharge: 2020-11-29 | Disposition: A | Discharge status: Home / Self Care | Payer: Medicare Other | Source: Ambulatory Visit | Attending: Internal Medicine | Admitting: Internal Medicine

## 2020-11-29 DIAGNOSIS — K449 Diaphragmatic hernia without obstruction or gangrene: Secondary | ICD-10-CM | POA: Diagnosis not present

## 2020-11-29 DIAGNOSIS — R140 Abdominal distension (gaseous): Secondary | ICD-10-CM | POA: Diagnosis not present

## 2020-11-29 DIAGNOSIS — I700 Atherosclerosis of aorta: Secondary | ICD-10-CM | POA: Diagnosis not present

## 2020-11-29 DIAGNOSIS — Z90710 Acquired absence of both cervix and uterus: Secondary | ICD-10-CM | POA: Diagnosis not present

## 2020-11-29 DIAGNOSIS — R109 Unspecified abdominal pain: Secondary | ICD-10-CM | POA: Insufficient documentation

## 2020-11-29 DIAGNOSIS — K5730 Diverticulosis of large intestine without perforation or abscess without bleeding: Secondary | ICD-10-CM | POA: Diagnosis not present

## 2020-11-30 ENCOUNTER — Ambulatory Visit (INDEPENDENT_AMBULATORY_CARE_PROVIDER_SITE_OTHER): Payer: Medicare Other | Admitting: Nurse Practitioner

## 2020-11-30 VITALS — BP 143/86 | HR 61 | Ht 60.0 in | Wt 188.0 lb

## 2020-11-30 DIAGNOSIS — I890 Lymphedema, not elsewhere classified: Secondary | ICD-10-CM | POA: Diagnosis not present

## 2020-12-06 ENCOUNTER — Encounter (INDEPENDENT_AMBULATORY_CARE_PROVIDER_SITE_OTHER): Payer: Self-pay | Admitting: Nurse Practitioner

## 2020-12-07 ENCOUNTER — Other Ambulatory Visit: Payer: Self-pay | Admitting: Cardiovascular Disease

## 2020-12-07 ENCOUNTER — Other Ambulatory Visit: Payer: Self-pay

## 2020-12-07 ENCOUNTER — Encounter (INDEPENDENT_AMBULATORY_CARE_PROVIDER_SITE_OTHER): Payer: Self-pay

## 2020-12-07 ENCOUNTER — Ambulatory Visit (INDEPENDENT_AMBULATORY_CARE_PROVIDER_SITE_OTHER): Payer: Medicare Other | Admitting: Nurse Practitioner

## 2020-12-07 VITALS — BP 185/73 | HR 56 | Resp 16 | Wt 194.6 lb

## 2020-12-07 DIAGNOSIS — I890 Lymphedema, not elsewhere classified: Secondary | ICD-10-CM

## 2020-12-07 NOTE — Progress Notes (Signed)
History of Present Illness  There is no documented history at this time  Assessments & Plan   There are no diagnoses linked to this encounter.    Additional instructions  Subjective:  Patient presents with venous ulcer of the Bilateral lower extremity.    Procedure:  3 layer unna wrap was placed Bilateral lower extremity.   Plan:   Follow up in one week.  

## 2020-12-08 NOTE — Telephone Encounter (Signed)
Please advise for Furosemide refill. 90 day request  Pt mentioned that she is taking Furosemide 40 mg tablet. Takes 40 mg am and 20 mg pm qd. Pt is taking differently from last OV note 08/2020 w/Gollan  Pt mentioned that she has been going to Dr.Dew due to increase of swelling in her Legs/ankles/feet. Pt has had her legs wrapped for about a month and continues to have swelling in her feet. Pt mentioned that her legs/feet look better than they were but still is struggling with edema.

## 2020-12-10 ENCOUNTER — Other Ambulatory Visit: Payer: Self-pay | Admitting: Cardiovascular Disease

## 2020-12-12 ENCOUNTER — Encounter (INDEPENDENT_AMBULATORY_CARE_PROVIDER_SITE_OTHER): Payer: Self-pay | Admitting: Nurse Practitioner

## 2020-12-14 ENCOUNTER — Other Ambulatory Visit: Payer: Self-pay

## 2020-12-14 ENCOUNTER — Encounter (INDEPENDENT_AMBULATORY_CARE_PROVIDER_SITE_OTHER): Payer: Self-pay | Admitting: Nurse Practitioner

## 2020-12-14 ENCOUNTER — Ambulatory Visit (INDEPENDENT_AMBULATORY_CARE_PROVIDER_SITE_OTHER): Payer: Medicare Other | Admitting: Nurse Practitioner

## 2020-12-14 VITALS — BP 148/75 | HR 60 | Resp 20 | Ht 60.0 in | Wt 192.0 lb

## 2020-12-14 DIAGNOSIS — E1159 Type 2 diabetes mellitus with other circulatory complications: Secondary | ICD-10-CM | POA: Diagnosis not present

## 2020-12-14 DIAGNOSIS — I10 Essential (primary) hypertension: Secondary | ICD-10-CM

## 2020-12-14 DIAGNOSIS — I890 Lymphedema, not elsewhere classified: Secondary | ICD-10-CM

## 2020-12-14 NOTE — Progress Notes (Signed)
Subjective:    Patient ID: April [name], female    DOB: June 29, 1940, 80 y.o.   MRN: [id number] Chief Complaint  Patient presents with   Follow-up    HPI  Review of Systems     Objective:   Physical Exam Vitals reviewed.  HENT:     Head: Normocephalic.  Cardiovascular:     Rate and Rhythm: Normal rate.     Pulses: Normal pulses.  Pulmonary:     Effort: Pulmonary effort is normal.  Musculoskeletal:     Right lower leg: 2+ Edema present.     Left lower leg: 2+ Edema present.  Skin:    General: Skin is warm and dry.  Neurological:     Mental Status: She is alert and oriented to person, place, and time.  Psychiatric:        Mood and Affect: Mood normal.        Behavior: Behavior normal.        Thought Content: Thought content normal.        Judgment: Judgment normal.    BP (!) 148/75 (BP Location: Right Arm)   Pulse 60   Resp 20   Ht 5' (1.524 m)   Wt 192 lb (87.1 kg)   LMP 05/01/1972   BMI 37.50 kg/m   Past Medical History:  Diagnosis Date   CAD S/P percutaneous coronary angioplasty    a. 07/2015 MV: No ischemia, EF 66%;  b. 03/2016 Inflat STEMI/PCI: LM nl, LAD 25d, RI nl, LCX nl, OM1/2 nl, RCA 80p (3.5x16 Promus Premier DES), 50p/m, 30d, RPDA 90 (2.5x12 Promu Premier DES); c. 01/2018 Cath ([place], [place]): Patent RCA stents->Med Rx.   CKD (chronic kidney disease), stage III ([place])    Diastolic dysfunction    a. 10/2013 Echo: EF 55-65%, Gr1 DD; b. 01/2018 Echo ([place], [place]): EF 55-60%, Gr2 DD, RVSP 74mmHg.   Diverticulitis    Dyspnea    Endometriosis    Family history of adverse reaction to anesthesia    Mother - had to stay in ICU because of breathing difficulties every time she had anesthesia   GERD (gastroesophageal reflux disease)    Hiatal hernia    History of colon polyps 08/1994   History of Migraines    Hypercholesterolemia    Hypertension    LBBB (left bundle branch block)    Osteoporosis    PAF (paroxysmal atrial  fibrillation) ([place])    a. 03/2016 Dx @ time of MI; b. CHA2DS2VASc = 5-->Eliquis.   Rheumatic fever    Sleep apnea    CPAP   Spasmodic dysphonia    ST elevation myocardial infarction (STEMI) of inferolateral wall, initial episode of care ([place]) 03/25/2016   Urine incontinence     Social History   Socioeconomic History   Marital status: Widowed    Spouse name: Not on file   Number of children: 2   Years of education: Not on file   Highest education level: Not on file  Occupational History   Not on file  Tobacco Use   Smoking status: Never   Smokeless tobacco: Never  Vaping Use   Vaping Use: Never used  Substance and Sexual Activity   Alcohol use: No    Alcohol/week: 0.0 standard drinks   Drug use: No   Sexual activity: Not Currently    Birth control/protection: Post-menopausal  Other Topics Concern   Not on file  Social History Narrative   Not on file  Social Determinants of Health   Financial Resource Strain: Not on file  Food Insecurity: Not on file  Transportation Needs: Not on file  Physical Activity: Not on file  Stress: Not on file  Social Connections: Not on file  Intimate Partner Violence: Not on file    Past Surgical History:  Procedure Laterality Date   ABDOMINAL HYSTERECTOMY     ovaries not removed   APPENDECTOMY     was removed during hysterectomy   Breast biopsies     x2   BREAST EXCISIONAL BIOPSY Bilateral "years ago"   neg   BREAST SURGERY Bilateral    BROW LIFT Bilateral 08/15/2019   Procedure: BLEPHAROPLASTY UPPER EYELID; W/EXCESS SKIN BLEPHAROPTOSIS REPAIR; RESECT EX;  Surgeon: Karle Starch, MD;  Location: [facility];  Service: Ophthalmology;  Laterality: Bilateral;  sleep apnea   CARDIAC CATHETERIZATION  2011   moderate 40% RCA disease   CARDIAC CATHETERIZATION  01/2010   Dr. Gollan@[facility] : Only noted 40% RCA   CARDIAC CATHETERIZATION N/A 03/25/2016   Procedure: Left Heart Cath and Coronary Angiography;  Surgeon: 04/07/2016, MD;  Location: [facility];  Service: Cardiovascular;  Laterality: N/A;   CARDIAC CATHETERIZATION N/A 03/25/2016   Procedure: Coronary Stent Intervention;  Surgeon: 04/07/2016, MD;  Location: [facility];  Service: Cardiovascular;  Laterality: N/A;   COLONOSCOPY  2013   ESOPHAGOGASTRODUODENOSCOPY (EGD) WITH PROPOFOL N/A 03/17/2015   Procedure: ESOPHAGOGASTRODUODENOSCOPY (EGD) WITH PROPOFOL;  Surgeon: 03/30/2015, MD;  Location: [facility];  Service: Gastroenterology;  Laterality: N/A;   NM MYOVIEW ([place] HX)  07/2015   No evidence ischemia or infarction. EF 66%. Low risk   TONSILLECTOMY     TRANSTHORACIC ECHOCARDIOGRAM  10/2013   Normal LV size and function. EF 55-65%. GR 1 DD. Otherwise normal.    Family History  Problem Relation Age of Onset   Arthritis Mother    Stroke Mother    Hypertension Mother    Osteoporosis Mother    Heart disease Father        MI   Heart attack Father    Stroke Brother    Heart attack Sister    Breast cancer Other        first cousin x 2   Stroke Daughter    Heart attack Daughter    Colon cancer Neg Hx     Allergies  Allergen Reactions   Penicillins Other (See Comments)    Okay to take amoxicillin/(pt does not recall what the reaction to penicillin was (27-19 years old)    Sulfa Antibiotics Rash    CBC Latest Ref Rng & Units 10/04/2020 11/04/2019 01/10/2019  WBC 4.0 - 10.5 K/uL 9.2 5.5 6.1  Hemoglobin 12.0 - 15.0 g/dL 12.6 12.4 13.4  Hematocrit 36.0 - 46.0 % 37.7 37.5 40.6  Platelets 150 - 400 K/uL 194 227.0 200.0      CMP     Component Value Date/Time   NA 141 10/28/2020 1159   NA 147 (H) 04/29/2018 1058   K 4.7 10/28/2020 1159   K 3.9 03/23/2014 1617   CL 104 10/28/2020 1159   CO2 30 10/28/2020 1159   GLUCOSE 86 10/28/2020 1159   BUN 19 10/28/2020 1159   BUN 26 04/29/2018 1058   CREATININE 1.21 (H) 10/28/2020 1159   CALCIUM 9.1 10/28/2020 1159   PROT 5.7 (L) 11/17/2020 1033   ALBUMIN 3.4  (L) 11/17/2020 1033   AST 63 (H) 11/17/2020 1033  ALT 40 (H) 11/17/2020 1033   ALKPHOS 77 11/17/2020 1033   BILITOT 0.6 11/17/2020 1033   GFRNONAA 36 (L) 10/04/2020 2135   GFRAA 42 (L) 04/29/2018 1058     No results found.     Assessment & Plan:   1. Lymphedema The patient's lower extremities are much improved from previous follow-up visit.  He still has some persistent swelling near the distal feet.  Due to the patient's arthritis she is unable to wear compression.  However she does have a lymphedema pump at home.  Also complicating things the patient will be traveling out of town for about a week or so and will not be able to use Follow-up.  Therefore we will place Unna wraps today but the patient is advised to remove them in a week as she normally would.  The patient will return to the office for replacement of the wrap the next week.  She is advised to try to elevate her lower extremities is much as possible during her vacation.  The patient is also advised to utilize her lymphedema pump on a nightly basis even with her wraps.  We will plan on reevaluating in 4 weeks for her progress.  2. Type 2 diabetes mellitus with other circulatory complication, without long-term current use of insulin (HCC) Continue hypoglycemic medications as already ordered, these medications have been reviewed and there are no changes at this time.  Hgb A1C to be monitored as already arranged by primary service   3. Essential hypertension, benign Continue antihypertensive medications as already ordered, these medications have been reviewed and there are no changes at this time.    Current Outpatient Medications on File Prior to Visit  Medication Sig Dispense Refill   acetaminophen (TYLENOL) 500 MG tablet Take 500 mg by mouth daily as needed for headache (pain).     albuterol (PROVENTIL HFA;VENTOLIN HFA) 108 (90 Base) MCG/ACT inhaler Inhale 2 puffs into the lungs every 6 (six) hours as needed for wheezing  or shortness of breath. 1 Inhaler 6   amiodarone (PACERONE) 200 MG tablet TAKE 1 TABLET BY MOUTH DAILY 90 tablet 0   aspirin EC 81 MG tablet Take 1 tablet (81 mg total) by mouth daily. 90 tablet 3   Azelastine HCl 137 MCG/SPRAY SOLN USE 1 TO 2 SPRAYS IN EACH NOSTRIL TWICE A DAY AS NEEDED FOR DRAINAGE 90 mL 0   cholecalciferol (VITAMIN D) 1000 units tablet Take 1,000 Units by mouth 2 (two) times daily.     ELIQUIS 5 MG TABS tablet TAKE 1 TABLET BY MOUTH TWICE A DAY 180 tablet 1   fish oil-omega-3 fatty acids 1000 MG capsule Take 1 g by mouth 2 (two) times daily.      fluticasone-salmeterol (ADVAIR HFA) 230-21 MCG/ACT inhaler Inhale 2 puffs into the lungs 2 (two) times daily. 1 each 12   furosemide (LASIX) 40 MG tablet Take 1 tablet (40 mg total) by mouth 2 (two) times daily. Dr. Rockey Situ advised alternate 40 mg twice a day with 40 mg daily every other day 180 tablet 3   hydrALAZINE (APRESOLINE) 10 MG tablet TAKE ONE TABLET BY MOUTH THREE TIMES A DAY 270 tablet 0   losartan (COZAAR) 100 MG tablet TAKE 1 TABLET BY MOUTH DAILY 90 tablet 0   Misc Natural Products (OSTEO BI-FLEX JOINT SHIELD PO) Take 1 tablet by mouth 2 (two) times daily.      Multiple Vitamin (MULTIVITAMIN WITH MINERALS) TABS tablet Take 0.5 tablets by mouth 2 (two) times  daily.     nitroGLYCERIN (NITROSTAT) 0.4 MG SL tablet Place 1 tablet (0.4 mg total) under the tongue every 5 (five) minutes as needed. 25 tablet 0   pantoprazole (PROTONIX) 40 MG tablet TAKE 1 TABLET BY MOUTH DAILY 30 tablet 4   Polyethyl Glycol-Propyl Glycol (SYSTANE OP) Place 1 drop into both eyes daily as needed (dry eyes).     potassium chloride (KLOR-CON) 10 MEQ tablet TAKE 1 TABLET BY MOUTH TWICE A DAY 180 tablet 0   PRESCRIPTION MEDICATION Inhale into the lungs at bedtime. CPAP     propranolol (INDERAL) 20 MG tablet TAKE ONE TABLET BY MOUTH THREE TIMES A DAY 270 tablet 0   rosuvastatin (CRESTOR) 40 MG tablet TAKE 1 TABLET BY MOUTH DAILY 90 tablet 0   sucralfate  (CARAFATE) 1 g tablet Take 1 g by mouth 2 (two) times daily.     traMADol (ULTRAM) 50 MG tablet Take 1 every 4-6 hours as needed for pain not controlled by Tylenol 6 tablet 0   No current facility-administered medications on file prior to visit.    There are no Patient Instructions on file for this visit. No follow-ups on file.   Kris Hartmann, NP

## 2020-12-16 DIAGNOSIS — R7989 Other specified abnormal findings of blood chemistry: Secondary | ICD-10-CM | POA: Diagnosis not present

## 2020-12-28 ENCOUNTER — Encounter (INDEPENDENT_AMBULATORY_CARE_PROVIDER_SITE_OTHER): Payer: Self-pay

## 2020-12-28 ENCOUNTER — Other Ambulatory Visit: Payer: Self-pay

## 2020-12-28 ENCOUNTER — Ambulatory Visit (INDEPENDENT_AMBULATORY_CARE_PROVIDER_SITE_OTHER): Payer: Medicare Other | Admitting: Nurse Practitioner

## 2020-12-28 VITALS — BP 149/81 | HR 56 | Resp 16 | Wt 192.0 lb

## 2020-12-28 DIAGNOSIS — I890 Lymphedema, not elsewhere classified: Secondary | ICD-10-CM

## 2020-12-28 MED ORDER — CEPHALEXIN 500 MG PO CAPS: 500.0000 mg | ORAL_CAPSULE | Freq: Three times a day (TID) | ORAL | 0 refills | Status: DC

## 2020-12-28 NOTE — Progress Notes (Signed)
History of Present Illness  There is no documented history at this time  Assessments & Plan   There are no diagnoses linked to this encounter.    Additional instructions  Subjective:  Patient presents with venous ulcer of the  lower extremity.Bilateral    Procedure:  3 layer unna wrap was placed Bilateral lower extremity.   Plan:   Follow up in one week.   

## 2020-12-30 ENCOUNTER — Encounter: Payer: Self-pay | Admitting: Internal Medicine

## 2020-12-30 ENCOUNTER — Ambulatory Visit (INDEPENDENT_AMBULATORY_CARE_PROVIDER_SITE_OTHER): Payer: Medicare Other | Admitting: Internal Medicine

## 2020-12-30 ENCOUNTER — Other Ambulatory Visit: Payer: Self-pay

## 2020-12-30 VITALS — BP 130/72 | HR 68 | Temp 97.8°F | Resp 16 | Ht 60.0 in | Wt 193.2 lb

## 2020-12-30 DIAGNOSIS — I890 Lymphedema, not elsewhere classified: Secondary | ICD-10-CM

## 2020-12-30 DIAGNOSIS — Z9861 Coronary angioplasty status: Secondary | ICD-10-CM | POA: Diagnosis not present

## 2020-12-30 DIAGNOSIS — Z23 Encounter for immunization: Secondary | ICD-10-CM

## 2020-12-30 DIAGNOSIS — I10 Essential (primary) hypertension: Secondary | ICD-10-CM | POA: Diagnosis not present

## 2020-12-30 DIAGNOSIS — I5032 Chronic diastolic (congestive) heart failure: Secondary | ICD-10-CM

## 2020-12-30 DIAGNOSIS — I700 Atherosclerosis of aorta: Secondary | ICD-10-CM

## 2020-12-30 DIAGNOSIS — I480 Paroxysmal atrial fibrillation: Secondary | ICD-10-CM

## 2020-12-30 DIAGNOSIS — K219 Gastro-esophageal reflux disease without esophagitis: Secondary | ICD-10-CM | POA: Diagnosis not present

## 2020-12-30 DIAGNOSIS — I25110 Atherosclerotic heart disease of native coronary artery with unstable angina pectoris: Secondary | ICD-10-CM | POA: Diagnosis not present

## 2020-12-30 DIAGNOSIS — E1159 Type 2 diabetes mellitus with other circulatory complications: Secondary | ICD-10-CM

## 2020-12-30 DIAGNOSIS — I2510 Atherosclerotic heart disease of native coronary artery without angina pectoris: Secondary | ICD-10-CM

## 2020-12-30 DIAGNOSIS — G4733 Obstructive sleep apnea (adult) (pediatric): Secondary | ICD-10-CM

## 2020-12-30 DIAGNOSIS — E7800 Pure hypercholesterolemia, unspecified: Secondary | ICD-10-CM | POA: Diagnosis not present

## 2020-12-30 DIAGNOSIS — N183 Chronic kidney disease, stage 3 unspecified: Secondary | ICD-10-CM

## 2020-12-30 DIAGNOSIS — F439 Reaction to severe stress, unspecified: Secondary | ICD-10-CM | POA: Diagnosis not present

## 2020-12-30 DIAGNOSIS — R7989 Other specified abnormal findings of blood chemistry: Secondary | ICD-10-CM

## 2020-12-30 DIAGNOSIS — R945 Abnormal results of liver function studies: Secondary | ICD-10-CM

## 2020-12-30 NOTE — Progress Notes (Signed)
Patient ID: Madilynn S Letterman, female   DOB: 11/21/1940, 80 y.o.   MRN: [id number]   Subjective:    Patient ID: Yaminah S Rocca, female    DOB: 05/19/1940, 80 y.o.   MRN: [id number]  This visit occurred during the SARS-CoV-2 public health emergency.  Safety protocols were in place, including screening questions prior to the visit, additional usage of staff PPE, and extensive cleaning of exam room while observing appropriate contact time as indicated for disinfecting solutions.   Patient here for follow up appt.  Chief Complaint  Patient presents with   Chronic Kidney Disease   Hypertension   Diabetes   Hyperlipidemia   .   HPI Had f/u recently with GI (12/16/20).  Had previously been started on carafate.  Nausea is better.  Bowels are stable.  Ultrasound 03/2019 and CT 11/2020 - unremarkable.  Recommended continuing monitoring LFTs.  Also following up with vascular surgery for chronic lower extremity swelling.  Has lymphedema pump.  Legs wrapped.  No chest pain.  Breathing overall stable.  Still with sob with exertion, but stable.  Eating.  Carafate and protonix have helped GI symptoms.  No increased abdominal pain.     Past Medical History:  Diagnosis Date   CAD S/P percutaneous coronary angioplasty    a. 07/2015 MV: No ischemia, EF 66%;  b. 03/2016 Inflat STEMI/PCI: LM nl, LAD 25d, RI nl, LCX nl, OM1/2 nl, RCA 80p (3.5x16 Promus Premier DES), 50p/m, 30d, RPDA 90 (2.5x12 Promu Premier DES); c. 01/2018 Cath ([place] [facility] [place], [place]): Patent RCA stents->Med Rx.   CKD (chronic kidney disease), stage III (HCC)    Diastolic dysfunction    a. 10/2013 Echo: EF 55-65%, Gr1 DD; b. 01/2018 Echo ([place] [facility] [place], [place]): EF 55-60%, Gr2 DD, RVSP 41mmHg.   Diverticulitis    Dyspnea    Endometriosis    Family history of adverse reaction to anesthesia    Mother - had to stay in ICU because of breathing difficulties every time she had anesthesia   GERD (gastroesophageal reflux disease)    Hiatal  hernia    History of colon polyps 08/1994   History of Migraines    Hypercholesterolemia    Hypertension    LBBB (left bundle branch block)    Osteoporosis    PAF (paroxysmal atrial fibrillation) (HCC)    a. 03/2016 Dx @ time of MI; b. CHA2DS2VASc = 5-->Eliquis.   Rheumatic fever    Sleep apnea    CPAP   Spasmodic dysphonia    ST elevation myocardial infarction (STEMI) of inferolateral wall, initial episode of care (HCC) 03/25/2016   Urine incontinence    Past Surgical History:  Procedure Laterality Date   ABDOMINAL HYSTERECTOMY     ovaries not removed   APPENDECTOMY     was removed during hysterectomy   Breast biopsies     x2   BREAST EXCISIONAL BIOPSY Bilateral "years ago"   neg   BREAST SURGERY Bilateral    BROW LIFT Bilateral 08/15/2019   Procedure: BLEPHAROPLASTY UPPER EYELID; W/EXCESS SKIN BLEPHAROPTOSIS REPAIR; RESECT EX;  Surgeon: Fowler, Amy M, MD;  Location: [facility];  Service: Ophthalmology;  Laterality: Bilateral;  sleep apnea   CARDIAC CATHETERIZATION  2011   moderate 40% RCA disease   CARDIAC CATHETERIZATION  01/2010   Dr. Gollan@[facility]: Only noted 40% RCA   CARDIAC CATHETERIZATION N/A 03/25/2016   Procedure: Left Heart Cath and Coronary Angiography;  Surgeon: David W Harding, MD;  Location: [facility];  Service: Cardiovascular;  Laterality: N/A;   CARDIAC CATHETERIZATION N/A 03/25/2016   Procedure: Coronary Stent Intervention;  Surgeon: Leonie Man, MD;  Location: [facility];  Service: Cardiovascular;  Laterality: N/A;   COLONOSCOPY  2013   ESOPHAGOGASTRODUODENOSCOPY (EGD) WITH PROPOFOL N/A 03/17/2015   Procedure: ESOPHAGOGASTRODUODENOSCOPY (EGD) WITH PROPOFOL;  Surgeon: Christene Lye, MD;  Location: [facility];  Service: Gastroenterology;  Laterality: N/A;   NM MYOVIEW ([place] [facility])  07/2015   No evidence ischemia or infarction. EF 66%. Low risk   TONSILLECTOMY     TRANSTHORACIC ECHOCARDIOGRAM  10/2013   Normal LV size  and function. EF 55-65%. GR 1 DD. Otherwise normal.   Family History  Problem Relation Age of Onset   Arthritis Mother    Stroke Mother    Hypertension Mother    Osteoporosis Mother    Heart disease Father        MI   Heart attack Father    Stroke Brother    Heart attack Sister    Breast cancer Other        first cousin x 2   Stroke Daughter    Heart attack Daughter    Colon cancer Neg Hx    Social History   Socioeconomic History   Marital status: Widowed    Spouse name: Not on file   Number of children: 2   Years of education: Not on file   Highest education level: Not on file  Occupational History   Not on file  Tobacco Use   Smoking status: Never   Smokeless tobacco: Never  Vaping Use   Vaping Use: Never used  Substance and Sexual Activity   Alcohol use: No    Alcohol/week: 0.0 standard drinks   Drug use: No   Sexual activity: Not Currently    Birth control/protection: Post-menopausal  Other Topics Concern   Not on file  Social History Narrative   Not on file   Social Determinants of Health   Financial Resource Strain: Not on file  Food Insecurity: Not on file  Transportation Needs: Not on file  Physical Activity: Not on file  Stress: Not on file  Social Connections: Not on file    Review of Systems  Constitutional:  Negative for appetite change and unexpected weight change.  HENT:  Negative for congestion and sinus pressure.   Respiratory:  Negative for cough and chest tightness.        Breathing overall stable.   Cardiovascular:  Negative for chest pain and palpitations.       Chronic leg swelling.   Gastrointestinal:  Negative for abdominal pain and vomiting.       Nausea better.   Genitourinary:  Negative for difficulty urinating and dysuria.  Musculoskeletal:  Negative for joint swelling and myalgias.  Skin:  Negative for color change and rash.  Neurological:  Negative for dizziness, light-headedness and headaches.  Psychiatric/Behavioral:   Negative for agitation and dysphoric mood.       Objective:     BP 130/72   Pulse 68   Temp 97.8 F (36.6 C)   Resp 16   Ht 5' (1.524 m)   Wt 193 lb 3.2 oz (87.6 kg)   LMP 05/01/1972   SpO2 99%   BMI 37.73 kg/m  Wt Readings from Last 3 Encounters:  12/30/20 193 lb 3.2 oz (87.6 kg)  12/28/20 192 lb (87.1 kg)  12/14/20 192 lb (87.1 kg)    Physical Exam Vitals  reviewed.  Constitutional:      General: She is not in acute distress.    Appearance: Normal appearance.  HENT:     Head: Normocephalic and atraumatic.     Right Ear: External ear normal.     Left Ear: External ear normal.  Eyes:     General: No scleral icterus.       Right eye: No discharge.        Left eye: No discharge.     Conjunctiva/sclera: Conjunctivae normal.  Neck:     Thyroid: No thyromegaly.  Cardiovascular:     Rate and Rhythm: Normal rate and regular rhythm.  Pulmonary:     Effort: No respiratory distress.     Breath sounds: Normal breath sounds. No wheezing.  Abdominal:     General: Bowel sounds are normal.     Palpations: Abdomen is soft.     Tenderness: There is no abdominal tenderness.  Musculoskeletal:     Cervical back: Neck supple. No tenderness.     Comments: Chronic lower extremity swelling.  Legs wrapped.    Lymphadenopathy:     Cervical: No cervical adenopathy.  Skin:    Findings: No erythema or rash.  Neurological:     Mental Status: She is alert.  Psychiatric:        Mood and Affect: Mood normal.        Behavior: Behavior normal.     Outpatient Encounter Medications as of 12/30/2020  Medication Sig   acetaminophen (TYLENOL) 500 MG tablet Take 500 mg by mouth daily as needed for headache (pain).   albuterol (PROVENTIL HFA;VENTOLIN HFA) 108 (90 Base) MCG/ACT inhaler Inhale 2 puffs into the lungs every 6 (six) hours as needed for wheezing or shortness of breath.   amiodarone (PACERONE) 200 MG tablet TAKE 1 TABLET BY MOUTH DAILY   aspirin EC 81 MG tablet Take 1 tablet (81 mg  total) by mouth daily.   Azelastine HCl 137 MCG/SPRAY SOLN USE 1 TO 2 SPRAYS IN EACH NOSTRIL TWICE A DAY AS NEEDED FOR DRAINAGE   cephALEXin (KEFLEX) 500 MG capsule Take 1 capsule (500 mg total) by mouth 3 (three) times daily.   cholecalciferol (VITAMIN D) 1000 units tablet Take 1,000 Units by mouth 2 (two) times daily.   ELIQUIS 5 MG TABS tablet TAKE 1 TABLET BY MOUTH TWICE A DAY   fish oil-omega-3 fatty acids 1000 MG capsule Take 1 g by mouth 2 (two) times daily.    fluticasone-salmeterol (ADVAIR HFA) 230-21 MCG/ACT inhaler Inhale 2 puffs into the lungs 2 (two) times daily.   furosemide (LASIX) 40 MG tablet Take 1 tablet (40 mg total) by mouth 2 (two) times daily. Dr. Rockey Situ advised alternate 40 mg twice a day with 40 mg daily every other day   hydrALAZINE (APRESOLINE) 10 MG tablet TAKE ONE TABLET BY MOUTH THREE TIMES A DAY   losartan (COZAAR) 100 MG tablet TAKE 1 TABLET BY MOUTH DAILY   Misc Natural Products (OSTEO BI-FLEX JOINT SHIELD PO) Take 1 tablet by mouth 2 (two) times daily.    Multiple Vitamin (MULTIVITAMIN WITH MINERALS) TABS tablet Take 0.5 tablets by mouth 2 (two) times daily.   nitroGLYCERIN (NITROSTAT) 0.4 MG SL tablet Place 1 tablet (0.4 mg total) under the tongue every 5 (five) minutes as needed.   pantoprazole (PROTONIX) 40 MG tablet TAKE 1 TABLET BY MOUTH DAILY   Polyethyl Glycol-Propyl Glycol (SYSTANE OP) Place 1 drop into both eyes daily as needed (dry eyes).   potassium  chloride (KLOR-CON) 10 MEQ tablet TAKE 1 TABLET BY MOUTH TWICE A DAY   PRESCRIPTION MEDICATION Inhale into the lungs at bedtime. CPAP   propranolol (INDERAL) 20 MG tablet TAKE ONE TABLET BY MOUTH THREE TIMES A DAY   rosuvastatin (CRESTOR) 40 MG tablet TAKE 1 TABLET BY MOUTH DAILY   sucralfate (CARAFATE) 1 g tablet Take 1 g by mouth 2 (two) times daily.   traMADol (ULTRAM) 50 MG tablet Take 1 every 4-6 hours as needed for pain not controlled by Tylenol   No facility-administered encounter medications on  file as of 12/30/2020.     Lab Results  Component Value Date   WBC 9.2 10/04/2020   HGB 12.6 10/04/2020   HCT 37.7 10/04/2020   PLT 194 10/04/2020   GLUCOSE 86 10/28/2020   CHOL 146 04/29/2020   TRIG 87.0 04/29/2020   HDL 63.90 04/29/2020   LDLCALC 65 04/29/2020   ALT 40 (H) 11/17/2020   AST 63 (H) 11/17/2020   NA 141 10/28/2020   K 4.7 10/28/2020   CL 104 10/28/2020   CREATININE 1.21 (H) 10/28/2020   BUN 19 10/28/2020   CO2 30 10/28/2020   TSH 3.40 11/04/2019   INR 0.99 03/25/2016   HGBA1C 6.1 04/29/2020   MICROALBUR <0.7 04/29/2020    CT Abdomen Pelvis Wo Contrast  Result Date: 11/30/2020 CLINICAL DATA:  Abdominal pain EXAM: CT ABDOMEN AND PELVIS WITHOUT CONTRAST TECHNIQUE: Multidetector CT imaging of the abdomen and pelvis was performed following the standard protocol without IV contrast. COMPARISON:  02/18/2015 FINDINGS: Lower chest: Large hiatal hernia. Contrast seen within the distal esophagus and hernia, likely reflux. Coronary artery and aortic calcifications. Hepatobiliary: No focal hepatic abnormality. Gallbladder unremarkable. Pancreas: No focal abnormality or ductal dilatation. Spleen: No focal abnormality.  Normal size. Adrenals/Urinary Tract: No adrenal abnormality. No focal renal abnormality. No stones or hydronephrosis. Urinary bladder is unremarkable. Stomach/Bowel: Left colonic diverticulosis. No active diverticulitis. No evidence of bowel obstruction. Vascular/Lymphatic: Aortic calcifications. No evidence of aneurysm or adenopathy. Reproductive: Prior hysterectomy.  No adnexal masses. Other: No free fluid or free air. Musculoskeletal: No acute bony abnormality. IMPRESSION: Left colonic diverticulosis.  No active diverticulitis. Large hiatal hernia with probable gastroesophageal reflux. Coronary artery disease, aortic atherosclerosis. No acute findings in the abdomen or pelvis. Electronically Signed   By: Kevin  Dover M.D.   On: 11/30/2020 08:20       Assessment &  Plan:   Problem List Items Addressed This Visit     Abnormal liver function tests    Elevated LFTs.  Being followed and worked up by GI.        Aortic atherosclerosis (HCC)    Continue crestor.       CAD S/P PCI pRCA Promus DES 3.5 x 16 (4.1 mm), ostRPDA Promus DES 2.5 x 12 (2.7 mm)    No chest pain - continues hydralazine, losartan and eliquis.  Follow.       Chronic heart failure with preserved ejection fraction (HCC)    Chronic lower extremity swelling.  Continue wraps and lymphedema pump.  Weight stable.  Continue current medication regimen.        CKD (chronic kidney disease), stage III (HCC)    Avoid antiinflammatories.  Follow metabolic panel.       Coronary artery disease involving native coronary artery of native heart with unstable angina pectoris (HCC)    No chest pain - continues hydralazine, losartan and eliquis.  Follow.       Diabetes mellitus (HCC)      Low carb diet and exercise.  Follow met b and a1c.       GERD (gastroesophageal reflux disease)    Upper symptoms improved.  Continues on protonix and carafate.       Lymphedema    Continue wraps and lymphedema pump.  Follow.        Paroxysmal atrial fibrillation (HCC)    On eliquis and amiodarone.  Stable.  Followed by cardiology.       Stress    Overall appears to be handling things well.  Follow.       Essential hypertension, benign (Chronic)    Continue losartan.  Blood pressure as outlined.  Follow pressures.  Follow metabolic panel.       Hypercholesterolemia (Chronic)    On crestor.  Low cholesterol diet and exercise.  Follow lipid panel and liver function tests.        Obstructive sleep apnea (Chronic)    Continue cpap.       Other Visit Diagnoses     Need for immunization against influenza    -  Primary   Relevant Orders   Flu Vaccine QUAD High Dose(Fluad) (Completed)        Charlene Scott, MD  

## 2021-01-03 ENCOUNTER — Encounter (INDEPENDENT_AMBULATORY_CARE_PROVIDER_SITE_OTHER): Payer: Self-pay | Admitting: Nurse Practitioner

## 2021-01-03 ENCOUNTER — Encounter: Payer: Self-pay | Admitting: Internal Medicine

## 2021-01-03 DIAGNOSIS — I700 Atherosclerosis of aorta: Secondary | ICD-10-CM | POA: Insufficient documentation

## 2021-01-03 NOTE — Assessment & Plan Note (Signed)
Chronic lower extremity swelling.  Continue wraps and lymphedema pump.  Weight stable.  Continue current medication regimen.

## 2021-01-03 NOTE — Assessment & Plan Note (Signed)
On eliquis and amiodarone.  Stable.  Followed by cardiology.

## 2021-01-03 NOTE — Assessment & Plan Note (Signed)
No chest pain - continues hydralazine, losartan and eliquis.  Follow.

## 2021-01-03 NOTE — Assessment & Plan Note (Signed)
Continue cpap.  

## 2021-01-03 NOTE — Assessment & Plan Note (Signed)
On crestor.  Low cholesterol diet and exercise.  Follow lipid panel and liver function tests.   

## 2021-01-03 NOTE — Assessment & Plan Note (Signed)
Overall appears to be handling things well.  Follow.  ?

## 2021-01-03 NOTE — Assessment & Plan Note (Signed)
Continue crestor 

## 2021-01-03 NOTE — Assessment & Plan Note (Signed)
Elevated LFTs.  Being followed and worked up by GI.

## 2021-01-03 NOTE — Assessment & Plan Note (Addendum)
Continue losartan.  Blood pressure as outlined.  Follow pressures.  Follow metabolic panel.  

## 2021-01-03 NOTE — Assessment & Plan Note (Signed)
Upper symptoms improved.  Continues on protonix and carafate.

## 2021-01-03 NOTE — Assessment & Plan Note (Signed)
Low carb diet and exercise.  Follow met b and a1c.  

## 2021-01-03 NOTE — Assessment & Plan Note (Signed)
Avoid antiinflammatories.  Follow metabolic panel.  

## 2021-01-03 NOTE — Assessment & Plan Note (Signed)
Continue wraps and lymphedema pump.  Follow.  

## 2021-01-04 ENCOUNTER — Ambulatory Visit (INDEPENDENT_AMBULATORY_CARE_PROVIDER_SITE_OTHER): Payer: Medicare Other | Admitting: Nurse Practitioner

## 2021-01-04 ENCOUNTER — Other Ambulatory Visit: Payer: Self-pay

## 2021-01-04 VITALS — BP 124/68 | HR 58 | Ht 60.0 in | Wt 191.0 lb

## 2021-01-04 DIAGNOSIS — I890 Lymphedema, not elsewhere classified: Secondary | ICD-10-CM

## 2021-01-04 NOTE — Progress Notes (Signed)
History of Present Illness  There is no documented history at this time  Assessments & Plan   There are no diagnoses linked to this encounter.    Additional instructions  Subjective:  Patient presents with venous ulcer of the Bilateral lower extremity.    Procedure:  3 layer unna wrap was placed Bilateral lower extremity.   Plan:   Follow up in one week.  

## 2021-01-09 ENCOUNTER — Encounter (INDEPENDENT_AMBULATORY_CARE_PROVIDER_SITE_OTHER): Payer: Self-pay | Admitting: Nurse Practitioner

## 2021-01-11 ENCOUNTER — Ambulatory Visit (INDEPENDENT_AMBULATORY_CARE_PROVIDER_SITE_OTHER): Payer: Medicare Other | Admitting: Nurse Practitioner

## 2021-01-11 ENCOUNTER — Encounter (INDEPENDENT_AMBULATORY_CARE_PROVIDER_SITE_OTHER): Payer: Self-pay

## 2021-01-11 ENCOUNTER — Other Ambulatory Visit: Payer: Self-pay

## 2021-01-11 VITALS — BP 136/70 | HR 62 | Resp 16

## 2021-01-11 DIAGNOSIS — I890 Lymphedema, not elsewhere classified: Secondary | ICD-10-CM | POA: Diagnosis not present

## 2021-01-11 NOTE — Progress Notes (Signed)
History of Present Illness  There is no documented history at this time  Assessments & Plan   There are no diagnoses linked to this encounter.    Additional instructions  Subjective:  Patient presents with venous ulcer of the Bilateral lower extremity.    Procedure:  3 layer unna wrap was placed Bilateral lower extremity.   Plan:   Follow up in one week.  

## 2021-01-14 NOTE — Progress Notes (Signed)
History of Present Illness  There is no documented history at this time  Assessments & Plan   There are no diagnoses linked to this encounter.    Additional instructions  Subjective:  Patient presents with venous ulcer of the Bilateral lower extremity.    Procedure:  3 layer unna wrap was placed Bilateral lower extremity.   Plan:   Follow up in one week.  

## 2021-01-16 ENCOUNTER — Encounter (INDEPENDENT_AMBULATORY_CARE_PROVIDER_SITE_OTHER): Payer: Self-pay | Admitting: Nurse Practitioner

## 2021-01-18 ENCOUNTER — Ambulatory Visit (INDEPENDENT_AMBULATORY_CARE_PROVIDER_SITE_OTHER): Payer: Medicare Other | Admitting: Nurse Practitioner

## 2021-01-18 ENCOUNTER — Other Ambulatory Visit: Payer: Self-pay

## 2021-01-18 VITALS — BP 138/72 | HR 65 | Ht 60.0 in | Wt 189.0 lb

## 2021-01-18 DIAGNOSIS — L03116 Cellulitis of left lower limb: Secondary | ICD-10-CM

## 2021-01-18 DIAGNOSIS — I10 Essential (primary) hypertension: Secondary | ICD-10-CM | POA: Diagnosis not present

## 2021-01-18 DIAGNOSIS — I890 Lymphedema, not elsewhere classified: Secondary | ICD-10-CM | POA: Diagnosis not present

## 2021-01-20 ENCOUNTER — Other Ambulatory Visit: Payer: Self-pay

## 2021-01-20 ENCOUNTER — Other Ambulatory Visit (INDEPENDENT_AMBULATORY_CARE_PROVIDER_SITE_OTHER): Payer: Medicare Other

## 2021-01-20 ENCOUNTER — Encounter (INDEPENDENT_AMBULATORY_CARE_PROVIDER_SITE_OTHER): Payer: Self-pay | Admitting: Nurse Practitioner

## 2021-01-20 DIAGNOSIS — I10 Essential (primary) hypertension: Secondary | ICD-10-CM | POA: Diagnosis not present

## 2021-01-20 DIAGNOSIS — E7800 Pure hypercholesterolemia, unspecified: Secondary | ICD-10-CM

## 2021-01-20 DIAGNOSIS — E1159 Type 2 diabetes mellitus with other circulatory complications: Secondary | ICD-10-CM | POA: Diagnosis not present

## 2021-01-20 LAB — BASIC METABOLIC PANEL
BUN: 20 mg/dL (ref 6–23)
CO2: 29 mEq/L (ref 19–32)
Calcium: 9.2 mg/dL (ref 8.4–10.5)
Chloride: 103 mEq/L (ref 96–112)
Creatinine, Ser: 1.2 mg/dL (ref 0.40–1.20)
GFR: 42.23 mL/min — ABNORMAL LOW (ref >60.00)
Glucose, Bld: 100 mg/dL — ABNORMAL HIGH (ref 70–99)
Potassium: 4.6 mEq/L (ref 3.5–5.1)
Sodium: 140 mEq/L (ref 135–145)

## 2021-01-20 LAB — HEMOGLOBIN A1C: Hgb A1c MFr Bld: 6.2 % (ref 4.6–6.5)

## 2021-01-20 LAB — TSH: TSH: 5.15 u[IU]/mL (ref 0.35–5.50)

## 2021-01-20 LAB — CBC WITH DIFFERENTIAL/PLATELET
Basophils Absolute: 0 10*3/uL (ref 0.0–0.1)
Basophils Relative: 0.7 % (ref 0.0–3.0)
Eosinophils Absolute: 0.1 10*3/uL (ref 0.0–0.7)
Eosinophils Relative: 1.9 % (ref 0.0–5.0)
HCT: 39.4 % (ref 36.0–46.0)
Hemoglobin: 12.9 g/dL (ref 12.0–15.0)
Lymphocytes Relative: 30.8 % (ref 12.0–46.0)
Lymphs Abs: 1.9 10*3/uL (ref 0.7–4.0)
MCHC: 32.7 g/dL (ref 30.0–36.0)
MCV: 96.6 fl (ref 78.0–100.0)
Monocytes Absolute: 0.7 10*3/uL (ref 0.1–1.0)
Monocytes Relative: 11.6 % (ref 3.0–12.0)
Neutro Abs: 3.4 10*3/uL (ref 1.4–7.7)
Neutrophils Relative %: 55 % (ref 43.0–77.0)
Platelets: 197 10*3/uL (ref 150.0–400.0)
RBC: 4.07 Mil/uL (ref 3.87–5.11)
RDW: 13.4 % (ref 11.5–15.5)
WBC: 6.2 10*3/uL (ref 4.0–10.5)

## 2021-01-20 LAB — LIPID PANEL
Cholesterol: 129 mg/dL (ref 0–200)
HDL: 50.9 mg/dL (ref >39.00)
LDL Cholesterol: 62 mg/dL (ref 0–99)
NonHDL: 77.6
Total CHOL/HDL Ratio: 3
Triglycerides: 80 mg/dL (ref 0.0–149.0)
VLDL: 16 mg/dL (ref 0.0–40.0)

## 2021-01-20 LAB — HEPATIC FUNCTION PANEL
ALT: 36 U/L — ABNORMAL HIGH (ref 0–35)
AST: 66 U/L — ABNORMAL HIGH (ref 0–37)
Albumin: 3.5 g/dL (ref 3.5–5.2)
Alkaline Phosphatase: 93 U/L (ref 39–117)
Bilirubin, Direct: 0.2 mg/dL (ref 0.0–0.3)
Total Bilirubin: 0.7 mg/dL (ref 0.2–1.2)
Total Protein: 6.2 g/dL (ref 6.0–8.3)

## 2021-01-24 ENCOUNTER — Encounter (INDEPENDENT_AMBULATORY_CARE_PROVIDER_SITE_OTHER): Payer: Self-pay | Admitting: Nurse Practitioner

## 2021-01-24 NOTE — Progress Notes (Signed)
Subjective:    Patient ID: April [name], female    DOB: [birth year], 80 y.o.   MRN: [id number] Chief Complaint  Patient presents with  . Follow-up    4 wk BIL unna boot check    April [name] is an 80 year old female that returns today in follow up of her leg swelling and lymphedema.  The patient was placed on antibiotics about 3 weeks ago due to cellulitis.  The leg is much improved no evidence of cellulitis today.  It is better than it was previously however.  The patient denies any wounds or ulcerations.  She denies any fevers or chills.  The patient has been afebrile for several weeks and is ready to transition out.  She has medical grade compression stockings as well as lymphedema pump at home to utilize.   Review of Systems  Cardiovascular:  Positive for leg swelling.  All other systems reviewed and are negative.     Objective:   Physical Exam Vitals reviewed.  HENT:     Head: Normocephalic.  Cardiovascular:     Rate and Rhythm: Normal rate.  Pulmonary:     Effort: Pulmonary effort is normal.  Musculoskeletal:     Right lower leg: 1+ Edema present.     Left lower leg: 2+ Edema present.  Neurological:     Mental Status: She is alert and oriented to person, place, and time.  Psychiatric:        Mood and Affect: Mood normal.        Behavior: Behavior normal.        Thought Content: Thought content normal.        Judgment: Judgment normal.    BP 138/72   Pulse 65   Ht 5' (1.524 m)   Wt 189 lb (85.7 kg)   LMP 05/01/1972   BMI 36.91 kg/m   Past Medical History:  Diagnosis Date  . CAD S/P percutaneous coronary angioplasty    a. 07/2015 MV: No ischemia, EF 66%;  b. 03/2016 Inflat STEMI/PCI: LM nl, LAD 25d, RI nl, LCX nl, OM1/2 nl, RCA 80p (3.5x16 Promus Premier DES), 50p/m, 30d, RPDA 90 (2.5x12 Promu Premier DES); c. 01/2018 Cath ([place], [place]): Patent RCA stents->Med Rx.  . CKD (chronic kidney disease), stage III ([place])   . Diastolic dysfunction     a. 10/2013 Echo: EF 55-65%, Gr1 DD; b. 01/2018 Echo ([place], [place]): EF 55-60%, Gr2 DD, RVSP 35mmHg.  . Diverticulitis   . Dyspnea   . Endometriosis   . Family history of adverse reaction to anesthesia    Mother - had to stay in ICU because of breathing difficulties every time she had anesthesia  . GERD (gastroesophageal reflux disease)   . Hiatal hernia   . History of colon polyps 08/1994  . History of Migraines   . Hypercholesterolemia   . Hypertension   . LBBB (left bundle branch block)   . Osteoporosis   . PAF (paroxysmal atrial fibrillation) ([place])    a. 03/2016 Dx @ time of MI; b. CHA2DS2VASc = 5-->Eliquis.  . Rheumatic fever   . Sleep apnea    CPAP  . Spasmodic dysphonia   . ST elevation myocardial infarction (STEMI) of inferolateral wall, initial episode of care ([place]) 03/25/2016  . Urine incontinence     Social History   Socioeconomic History  . Marital status: Widowed    Spouse name: Not on file  . Number of children: 2  . Years  of education: Not on file  . Highest education level: Not on file  Occupational History  . Not on file  Tobacco Use  . Smoking status: Never  . Smokeless tobacco: Never  Vaping Use  . Vaping Use: Never used  Substance and Sexual Activity  . Alcohol use: No    Alcohol/week: 0.0 standard drinks  . Drug use: No  . Sexual activity: Not Currently    Birth control/protection: Post-menopausal  Other Topics Concern  . Not on file  Social History Narrative  . Not on file   Social Determinants of Health   Financial Resource Strain: Not on file  Food Insecurity: Not on file  Transportation Needs: Not on file  Physical Activity: Not on file  Stress: Not on file  Social Connections: Not on file  Intimate Partner Violence: Not on file    Past Surgical History:  Procedure Laterality Date  . ABDOMINAL HYSTERECTOMY     ovaries not removed  . APPENDECTOMY     was removed during hysterectomy  . Breast biopsies     x2   . BREAST EXCISIONAL BIOPSY Bilateral "years ago"   neg  . BREAST SURGERY Bilateral   . BROW LIFT Bilateral 08/15/2019   Procedure: BLEPHAROPLASTY UPPER EYELID; W/EXCESS SKIN BLEPHAROPTOSIS REPAIR; RESECT EX;  Surgeon: Karle Starch, MD;  Location: [facility];  Service: Ophthalmology;  Laterality: Bilateral;  sleep apnea  . CARDIAC CATHETERIZATION  2011   moderate 40% RCA disease  . CARDIAC CATHETERIZATION  01/2010   Dr. Gollan@[facility] : Only noted 40% RCA  . CARDIAC CATHETERIZATION N/A 03/25/2016   Procedure: Left Heart Cath and Coronary Angiography;  Surgeon: 04/07/2016, MD;  Location: [facility];  Service: Cardiovascular;  Laterality: N/A;  . CARDIAC CATHETERIZATION N/A 03/25/2016   Procedure: Coronary Stent Intervention;  Surgeon: 04/07/2016, MD;  Location: [facility];  Service: Cardiovascular;  Laterality: N/A;  . COLONOSCOPY  2013  . ESOPHAGOGASTRODUODENOSCOPY (EGD) WITH PROPOFOL N/A 03/17/2015   Procedure: ESOPHAGOGASTRODUODENOSCOPY (EGD) WITH PROPOFOL;  Surgeon: 03/30/2015, MD;  Location: [facility];  Service: Gastroenterology;  Laterality: N/A;  . NM MYOVIEW ([place] HX)  07/2015   No evidence ischemia or infarction. EF 66%. Low risk  . TONSILLECTOMY    . TRANSTHORACIC ECHOCARDIOGRAM  10/2013   Normal LV size and function. EF 55-65%. GR 1 DD. Otherwise normal.    Family History  Problem Relation Age of Onset  . Arthritis Mother   . Stroke Mother   . Hypertension Mother   . Osteoporosis Mother   . Heart disease Father        MI  . Heart attack Father   . Stroke Brother   . Heart attack Sister   . Breast cancer Other        first cousin x 2  . Stroke Daughter   . Heart attack Daughter   . Colon cancer Neg Hx     Allergies  Allergen Reactions  . Penicillins Other (See Comments)    Okay to take amoxicillin/(pt does not recall what the reaction to penicillin was (42-85 years old)   . Sulfa Antibiotics Rash    CBC Latest  Ref Rng & Units 01/20/2021 10/04/2020 11/04/2019  WBC 4.0 - 10.5 K/uL 6.2 9.2 5.5  Hemoglobin 12.0 - 15.0 g/dL 12.9 12.6 12.4  Hematocrit 36.0 - 46.0 % 39.4 37.7 37.5  Platelets 150.0 - 400.0 K/uL 197.0 194 227.0      CMP  Component Value Date/Time   NA 140 01/20/2021 0920   NA 147 (H) 04/29/2018 1058   K 4.6 01/20/2021 0920   K 3.9 03/23/2014 1617   CL 103 01/20/2021 0920   CO2 29 01/20/2021 0920   GLUCOSE 100 (H) 01/20/2021 0920   BUN 20 01/20/2021 0920   BUN 26 04/29/2018 1058   CREATININE 1.20 01/20/2021 0920   CALCIUM 9.2 01/20/2021 0920   PROT 6.2 01/20/2021 0920   ALBUMIN 3.5 01/20/2021 0920   AST 66 (H) 01/20/2021 0920   ALT 36 (H) 01/20/2021 0920   ALKPHOS 93 01/20/2021 0920   BILITOT 0.7 01/20/2021 0920   GFRNONAA 36 (L) 10/04/2020 2135   GFRAA 42 (L) 04/29/2018 1058     No results found.     Assessment & Plan:   1. Lymphedema Patient will come out of the Unna boots and transition into medical grade compression stockings.  I have had a long discussion with the patient regarding swelling and why it  causes symptoms.  Patient will begin wearing graduated compression stockings class 1 (20-30 mmHg) on a daily basis a prescription was given. The patient will  beginning wearing the stockings first thing in the morning and removing them in the evening. The patient is instructed specifically not to sleep in the stockings.   In addition, behavioral modification will be initiated.  This will include frequent elevation, use of over the counter pain medications and exercise such as walking.   The patient will return in 1 month for evaluation of control of swelling.   2. Cellulitis of leg, left resolved  3. Essential hypertension, benign Continue antihypertensive medications as already ordered, these medications have been reviewed and there are no changes at this time.    Current Outpatient Medications on File Prior to Visit  Medication Sig Dispense Refill  .  acetaminophen (TYLENOL) 500 MG tablet Take 500 mg by mouth daily as needed for headache (pain).    Marland Kitchen albuterol (PROVENTIL HFA;VENTOLIN HFA) 108 (90 Base) MCG/ACT inhaler Inhale 2 puffs into the lungs every 6 (six) hours as needed for wheezing or shortness of breath. 1 Inhaler 6  . amiodarone (PACERONE) 200 MG tablet TAKE 1 TABLET BY MOUTH DAILY 90 tablet 0  . aspirin EC 81 MG tablet Take 1 tablet (81 mg total) by mouth daily. 90 tablet 3  . Azelastine HCl 137 MCG/SPRAY SOLN USE 1 TO 2 SPRAYS IN EACH NOSTRIL TWICE A DAY AS NEEDED FOR DRAINAGE 90 mL 0  . cephALEXin (KEFLEX) 500 MG capsule Take 1 capsule (500 mg total) by mouth 3 (three) times daily. 30 capsule 0  . cholecalciferol (VITAMIN D) 1000 units tablet Take 1,000 Units by mouth 2 (two) times daily.    Marland Kitchen ELIQUIS 5 MG TABS tablet TAKE 1 TABLET BY MOUTH TWICE A DAY 180 tablet 1  . fish oil-omega-3 fatty acids 1000 MG capsule Take 1 g by mouth 2 (two) times daily.     . fluticasone-salmeterol (ADVAIR HFA) 230-21 MCG/ACT inhaler Inhale 2 puffs into the lungs 2 (two) times daily. 1 each 12  . furosemide (LASIX) 40 MG tablet Take 1 tablet (40 mg total) by mouth 2 (two) times daily. Dr. Rockey Situ advised alternate 40 mg twice a day with 40 mg daily every other day 180 tablet 3  . hydrALAZINE (APRESOLINE) 10 MG tablet TAKE ONE TABLET BY MOUTH THREE TIMES A DAY 270 tablet 0  . losartan (COZAAR) 100 MG tablet TAKE 1 TABLET BY MOUTH DAILY 90 tablet  0  . Misc Natural Products (OSTEO BI-FLEX JOINT SHIELD PO) Take 1 tablet by mouth 2 (two) times daily.     . Multiple Vitamin (MULTIVITAMIN WITH MINERALS) TABS tablet Take 0.5 tablets by mouth 2 (two) times daily.    . nitroGLYCERIN (NITROSTAT) 0.4 MG SL tablet Place 1 tablet (0.4 mg total) under the tongue every 5 (five) minutes as needed. 25 tablet 0  . pantoprazole (PROTONIX) 40 MG tablet TAKE 1 TABLET BY MOUTH DAILY 30 tablet 4  . Polyethyl Glycol-Propyl Glycol (SYSTANE OP) Place 1 drop into both eyes daily as  needed (dry eyes).    . potassium chloride (KLOR-CON) 10 MEQ tablet TAKE 1 TABLET BY MOUTH TWICE A DAY 180 tablet 0  . PRESCRIPTION MEDICATION Inhale into the lungs at bedtime. CPAP    . propranolol (INDERAL) 20 MG tablet TAKE ONE TABLET BY MOUTH THREE TIMES A DAY 270 tablet 0  . rosuvastatin (CRESTOR) 40 MG tablet TAKE 1 TABLET BY MOUTH DAILY 90 tablet 0  . sucralfate (CARAFATE) 1 g tablet Take 1 g by mouth 2 (two) times daily.    . traMADol (ULTRAM) 50 MG tablet Take 1 every 4-6 hours as needed for pain not controlled by Tylenol 6 tablet 0   No current facility-administered medications on file prior to visit.    There are no Patient Instructions on file for this visit. No follow-ups on file.   Kris Hartmann, NP

## 2021-02-09 DIAGNOSIS — Z23 Encounter for immunization: Secondary | ICD-10-CM | POA: Diagnosis not present

## 2021-02-17 ENCOUNTER — Other Ambulatory Visit: Payer: Self-pay

## 2021-02-17 ENCOUNTER — Ambulatory Visit (INDEPENDENT_AMBULATORY_CARE_PROVIDER_SITE_OTHER): Payer: Medicare Other | Admitting: Nurse Practitioner

## 2021-02-17 ENCOUNTER — Encounter (INDEPENDENT_AMBULATORY_CARE_PROVIDER_SITE_OTHER): Payer: Self-pay | Admitting: Nurse Practitioner

## 2021-02-17 VITALS — BP 148/77 | HR 63 | Resp 16 | Wt 193.0 lb

## 2021-02-17 DIAGNOSIS — I890 Lymphedema, not elsewhere classified: Secondary | ICD-10-CM | POA: Diagnosis not present

## 2021-02-17 DIAGNOSIS — E1159 Type 2 diabetes mellitus with other circulatory complications: Secondary | ICD-10-CM

## 2021-02-17 DIAGNOSIS — I10 Essential (primary) hypertension: Secondary | ICD-10-CM | POA: Diagnosis not present

## 2021-02-17 MED ORDER — DOXYCYCLINE HYCLATE 100 MG PO CAPS: 100.0000 mg | ORAL_CAPSULE | Freq: Two times a day (BID) | ORAL | 0 refills | Status: DC

## 2021-02-22 ENCOUNTER — Encounter (INDEPENDENT_AMBULATORY_CARE_PROVIDER_SITE_OTHER): Payer: Self-pay | Admitting: Nurse Practitioner

## 2021-02-22 ENCOUNTER — Ambulatory Visit (INDEPENDENT_AMBULATORY_CARE_PROVIDER_SITE_OTHER): Payer: Medicare Other | Admitting: Nurse Practitioner

## 2021-02-22 NOTE — Progress Notes (Signed)
Subjective:    Patient ID: April [name], female    DOB: 23-Nov-1940, 80 y.o.   MRN: [id number] Chief Complaint  Patient presents with   Follow-up    1 month follow up    April [name] is an 80 year old female that returns today for evaluation of lower extremity edema.  The patient was previously doing wraps and her swelling was under better control.  The patient notes that she was in her general state of health when she suddenly injure her leg on a car door.  Although wound is not significantly anemic it has been bleeding clear fluid consistently.  The wound itself is very shallow.  She denies any fever chills or additional wounds.   Review of Systems  Cardiovascular:  Positive for leg swelling.  Skin:  Positive for wound.  All other systems reviewed and are negative.     Objective:   Physical Exam Vitals reviewed.  HENT:     Head: Normocephalic.  Cardiovascular:     Rate and Rhythm: Normal rate.     Pulses: Normal pulses.  Pulmonary:     Effort: Pulmonary effort is normal.  Neurological:     Mental Status: She is alert and oriented to person, place, and time.     Motor: Weakness present.     Gait: Gait abnormal.  Psychiatric:        Mood and Affect: Mood normal.        Behavior: Behavior normal.        Thought Content: Thought content normal.        Judgment: Judgment normal.    BP (!) 148/77 (BP Location: Right Arm)   Pulse 63   Resp 16   Wt 193 lb (87.5 kg)   LMP 05/01/1972   BMI 37.69 kg/m   Past Medical History:  Diagnosis Date   CAD S/P percutaneous coronary angioplasty    a. 07/2015 MV: No ischemia, EF 66%;  b. 03/2016 Inflat STEMI/PCI: LM nl, LAD 25d, RI nl, LCX nl, OM1/2 nl, RCA 80p (3.5x16 Promus Premier DES), 50p/m, 30d, RPDA 90 (2.5x12 Promu Premier DES); c. 01/2018 Cath ([place], [place]): Patent RCA stents->Med Rx.   CKD (chronic kidney disease), stage III ([place])    Diastolic dysfunction    a. 10/2013 Echo: EF 55-65%, Gr1 DD; b. 01/2018  Echo ([place], [place]): EF 55-60%, Gr2 DD, RVSP 69mmHg.   Diverticulitis    Dyspnea    Endometriosis    Family history of adverse reaction to anesthesia    Mother - had to stay in ICU because of breathing difficulties every time she had anesthesia   GERD (gastroesophageal reflux disease)    Hiatal hernia    History of colon polyps 08/1994   History of Migraines    Hypercholesterolemia    Hypertension    LBBB (left bundle branch block)    Osteoporosis    PAF (paroxysmal atrial fibrillation) ([place])    a. 03/2016 Dx @ time of MI; b. CHA2DS2VASc = 5-->Eliquis.   Rheumatic fever    Sleep apnea    CPAP   Spasmodic dysphonia    ST elevation myocardial infarction (STEMI) of inferolateral wall, initial episode of care ([place]) 03/25/2016   Urine incontinence     Social History   Socioeconomic History   Marital status: Widowed    Spouse name: Not on file   Number of children: 2   Years of education: Not on file   Highest education level:  Not on file  Occupational History   Not on file  Tobacco Use   Smoking status: Never   Smokeless tobacco: Never  Vaping Use   Vaping Use: Never used  Substance and Sexual Activity   Alcohol use: No    Alcohol/week: 0.0 standard drinks   Drug use: No   Sexual activity: Not Currently    Birth control/protection: Post-menopausal  Other Topics Concern   Not on file  Social History Narrative   Not on file   Social Determinants of Health   Financial Resource Strain: Not on file  Food Insecurity: Not on file  Transportation Needs: Not on file  Physical Activity: Not on file  Stress: Not on file  Social Connections: Not on file  Intimate Partner Violence: Not on file    Past Surgical History:  Procedure Laterality Date   ABDOMINAL HYSTERECTOMY     ovaries not removed   APPENDECTOMY     was removed during hysterectomy   Breast biopsies     x2   BREAST EXCISIONAL BIOPSY Bilateral "years ago"   neg   BREAST SURGERY Bilateral     BROW LIFT Bilateral 08/15/2019   Procedure: BLEPHAROPLASTY UPPER EYELID; W/EXCESS SKIN BLEPHAROPTOSIS REPAIR; RESECT EX;  Surgeon: Karle Starch, MD;  Location: [facility];  Service: Ophthalmology;  Laterality: Bilateral;  sleep apnea   CARDIAC CATHETERIZATION  2011   moderate 40% RCA disease   CARDIAC CATHETERIZATION  01/2010   Dr. Gollan@[facility] : Only noted 40% RCA   CARDIAC CATHETERIZATION N/A 03/25/2016   Procedure: Left Heart Cath and Coronary Angiography;  Surgeon: 04/07/2016, MD;  Location: [place] [facility];  Service: Cardiovascular;  Laterality: N/A;   CARDIAC CATHETERIZATION N/A 03/25/2016   Procedure: Coronary Stent Intervention;  Surgeon: 04/07/2016, MD;  Location: [facility] [facility];  Service: Cardiovascular;  Laterality: N/A;   COLONOSCOPY  2013   ESOPHAGOGASTRODUODENOSCOPY (EGD) WITH PROPOFOL N/A 03/17/2015   Procedure: ESOPHAGOGASTRODUODENOSCOPY (EGD) WITH PROPOFOL;  Surgeon: 03/30/2015, MD;  Location: [facility];  Service: Gastroenterology;  Laterality: N/A;   NM MYOVIEW ([place] [facility])  07/2015   No evidence ischemia or infarction. EF 66%. Low risk   TONSILLECTOMY     TRANSTHORACIC ECHOCARDIOGRAM  10/2013   Normal LV size and function. EF 55-65%. GR 1 DD. Otherwise normal.    Family History  Problem Relation Age of Onset   Arthritis Mother    Stroke Mother    Hypertension Mother    Osteoporosis Mother    Heart disease Father        MI   Heart attack Father    Stroke Brother    Heart attack Sister    Breast cancer Other        first cousin x 2   Stroke Daughter    Heart attack Daughter    Colon cancer Neg Hx     Allergies  Allergen Reactions   Penicillins Other (See Comments)    Okay to take amoxicillin/(pt does not recall what the reaction to penicillin was (38-17 years old)    Sulfa Antibiotics Rash    CBC Latest Ref Rng & Units 01/20/2021 10/04/2020 11/04/2019  WBC 4.0 - 10.5 K/uL 6.2 9.2 5.5  Hemoglobin 12.0 - 15.0 g/dL 12.9  12.6 12.4  Hematocrit 36.0 - 46.0 % 39.4 37.7 37.5  Platelets 150.0 - 400.0 K/uL 197.0 194 227.0      CMP     Component Value Date/Time   NA 140  01/20/2021 0920   NA 147 (H) 04/29/2018 1058   K 4.6 01/20/2021 0920   K 3.9 03/23/2014 1617   CL 103 01/20/2021 0920   CO2 29 01/20/2021 0920   GLUCOSE 100 (H) 01/20/2021 0920   BUN 20 01/20/2021 0920   BUN 26 04/29/2018 1058   CREATININE 1.20 01/20/2021 0920   CALCIUM 9.2 01/20/2021 0920   PROT 6.2 01/20/2021 0920   ALBUMIN 3.5 01/20/2021 0920   AST 66 (H) 01/20/2021 0920   ALT 36 (H) 01/20/2021 0920   ALKPHOS 93 01/20/2021 0920   BILITOT 0.7 01/20/2021 0920   GFRNONAA 36 (L) 10/04/2020 2135   GFRAA 42 (L) 04/29/2018 1058     No results found.     Assessment & Plan:   1. Lymphedema Due to the patient's new wound we will place the patient in Unna wraps.  She is draining the fluid and this will also help with that aspect.  We will also prescribe antibiotic prophylactically today for prevention of possible cellulitis which patient has had multiple issues with in the past.  The patient will continue to present to the office on a weekly basis for Unna wraps.  We will reevaluate in 4 weeks.  2. Essential hypertension, benign Continue antihypertensive medications as already ordered, these medications have been reviewed and there are no changes at this time.   3. Type 2 diabetes mellitus with other circulatory complication, without long-term current use of insulin (HCC) Continue hypoglycemic medications as already ordered, these medications have been reviewed and there are no changes at this time.  Hgb A1C to be monitored as already arranged by primary service    Current Outpatient Medications on File Prior to Visit  Medication Sig Dispense Refill   acetaminophen (TYLENOL) 500 MG tablet Take 500 mg by mouth daily as needed for headache (pain).     albuterol (PROVENTIL HFA;VENTOLIN HFA) 108 (90 Base) MCG/ACT inhaler Inhale 2  puffs into the lungs every 6 (six) hours as needed for wheezing or shortness of breath. 1 Inhaler 6   amiodarone (PACERONE) 200 MG tablet TAKE 1 TABLET BY MOUTH DAILY 90 tablet 0   aspirin EC 81 MG tablet Take 1 tablet (81 mg total) by mouth daily. 90 tablet 3   Azelastine HCl 137 MCG/SPRAY SOLN USE 1 TO 2 SPRAYS IN EACH NOSTRIL TWICE A DAY AS NEEDED FOR DRAINAGE 90 mL 0   cholecalciferol (VITAMIN D) 1000 units tablet Take 1,000 Units by mouth 2 (two) times daily.     ELIQUIS 5 MG TABS tablet TAKE 1 TABLET BY MOUTH TWICE A DAY 180 tablet 1   fish oil-omega-3 fatty acids 1000 MG capsule Take 1 g by mouth 2 (two) times daily.      fluticasone-salmeterol (ADVAIR HFA) 230-21 MCG/ACT inhaler Inhale 2 puffs into the lungs 2 (two) times daily. 1 each 12   furosemide (LASIX) 40 MG tablet Take 1 tablet (40 mg total) by mouth 2 (two) times daily. Dr. Rockey Situ advised alternate 40 mg twice a day with 40 mg daily every other day 180 tablet 3   hydrALAZINE (APRESOLINE) 10 MG tablet TAKE ONE TABLET BY MOUTH THREE TIMES A DAY 270 tablet 0   losartan (COZAAR) 100 MG tablet TAKE 1 TABLET BY MOUTH DAILY 90 tablet 0   Misc Natural Products (OSTEO BI-FLEX JOINT SHIELD PO) Take 1 tablet by mouth 2 (two) times daily.      Multiple Vitamin (MULTIVITAMIN WITH MINERALS) TABS tablet Take 0.5 tablets by mouth 2 (  two) times daily.     nitroGLYCERIN (NITROSTAT) 0.4 MG SL tablet Place 1 tablet (0.4 mg total) under the tongue every 5 (five) minutes as needed. 25 tablet 0   pantoprazole (PROTONIX) 40 MG tablet TAKE 1 TABLET BY MOUTH DAILY 30 tablet 4   Polyethyl Glycol-Propyl Glycol (SYSTANE OP) Place 1 drop into both eyes daily as needed (dry eyes).     potassium chloride (KLOR-CON) 10 MEQ tablet TAKE 1 TABLET BY MOUTH TWICE A DAY 180 tablet 0   PRESCRIPTION MEDICATION Inhale into the lungs at bedtime. CPAP     propranolol (INDERAL) 20 MG tablet TAKE ONE TABLET BY MOUTH THREE TIMES A DAY 270 tablet 0   rosuvastatin (CRESTOR) 40  MG tablet TAKE 1 TABLET BY MOUTH DAILY 90 tablet 0   sucralfate (CARAFATE) 1 g tablet Take 1 g by mouth 2 (two) times daily.     traMADol (ULTRAM) 50 MG tablet Take 1 every 4-6 hours as needed for pain not controlled by Tylenol 6 tablet 0   cephALEXin (KEFLEX) 500 MG capsule Take 1 capsule (500 mg total) by mouth 3 (three) times daily. (Patient not taking: Reported on 02/17/2021) 30 capsule 0   No current facility-administered medications on file prior to visit.    There are no Patient Instructions on file for this visit. No follow-ups on file.   Kris Hartmann, NP

## 2021-02-24 ENCOUNTER — Encounter (INDEPENDENT_AMBULATORY_CARE_PROVIDER_SITE_OTHER): Payer: Medicare Other

## 2021-02-25 ENCOUNTER — Ambulatory Visit (INDEPENDENT_AMBULATORY_CARE_PROVIDER_SITE_OTHER): Payer: Medicare Other | Admitting: Nurse Practitioner

## 2021-02-25 ENCOUNTER — Encounter (INDEPENDENT_AMBULATORY_CARE_PROVIDER_SITE_OTHER): Payer: Self-pay

## 2021-02-25 ENCOUNTER — Other Ambulatory Visit: Payer: Self-pay

## 2021-02-25 VITALS — BP 140/60 | HR 57 | Resp 16

## 2021-02-25 DIAGNOSIS — I890 Lymphedema, not elsewhere classified: Secondary | ICD-10-CM | POA: Diagnosis not present

## 2021-02-25 NOTE — Progress Notes (Signed)
History of Present Illness  There is no documented history at this time  Assessments & Plan   There are no diagnoses linked to this encounter.    Additional instructions  Subjective:  Patient presents with venous ulcer of the Bilateral lower extremity.    Procedure:  3 layer unna wrap was placed Bilateral lower extremity.   Plan:   Follow up in one week.  

## 2021-02-27 ENCOUNTER — Encounter (INDEPENDENT_AMBULATORY_CARE_PROVIDER_SITE_OTHER): Payer: Self-pay | Admitting: Nurse Practitioner

## 2021-03-03 ENCOUNTER — Encounter (INDEPENDENT_AMBULATORY_CARE_PROVIDER_SITE_OTHER): Payer: Self-pay

## 2021-03-03 ENCOUNTER — Ambulatory Visit (INDEPENDENT_AMBULATORY_CARE_PROVIDER_SITE_OTHER): Payer: Medicare Other | Admitting: Nurse Practitioner

## 2021-03-03 ENCOUNTER — Other Ambulatory Visit: Payer: Self-pay

## 2021-03-03 VITALS — BP 114/71 | HR 60 | Resp 16

## 2021-03-03 DIAGNOSIS — I890 Lymphedema, not elsewhere classified: Secondary | ICD-10-CM | POA: Diagnosis not present

## 2021-03-03 NOTE — Progress Notes (Signed)
History of Present Illness  There is no documented history at this time  Assessments & Plan   There are no diagnoses linked to this encounter.    Additional instructions  Subjective:  Patient presents with venous ulcer of the Bilateral lower extremity.    Procedure:  3 layer unna wrap was placed Bilateral lower extremity.   Plan:   Follow up in one week.  

## 2021-03-05 ENCOUNTER — Encounter (INDEPENDENT_AMBULATORY_CARE_PROVIDER_SITE_OTHER): Payer: Self-pay | Admitting: Nurse Practitioner

## 2021-03-10 ENCOUNTER — Encounter (INDEPENDENT_AMBULATORY_CARE_PROVIDER_SITE_OTHER): Payer: Self-pay | Admitting: Nurse Practitioner

## 2021-03-10 ENCOUNTER — Other Ambulatory Visit: Payer: Self-pay

## 2021-03-10 ENCOUNTER — Ambulatory Visit (INDEPENDENT_AMBULATORY_CARE_PROVIDER_SITE_OTHER): Payer: Medicare Other | Admitting: Nurse Practitioner

## 2021-03-10 ENCOUNTER — Other Ambulatory Visit: Payer: Self-pay | Admitting: Cardiovascular Disease

## 2021-03-10 VITALS — BP 143/53 | HR 60 | Resp 16

## 2021-03-10 DIAGNOSIS — I890 Lymphedema, not elsewhere classified: Secondary | ICD-10-CM | POA: Diagnosis not present

## 2021-03-10 NOTE — Progress Notes (Signed)
History of Present Illness  There is no documented history at this time  Assessments & Plan   There are no diagnoses linked to this encounter.    Additional instructions  Subjective:  Patient presents with venous ulcer of the Bilateral lower extremity.    Procedure:  3 layer unna wrap was placed Bilateral lower extremity.   Plan:   Follow up in one week.  

## 2021-03-14 ENCOUNTER — Other Ambulatory Visit: Payer: Self-pay | Admitting: Cardiovascular Disease

## 2021-03-14 ENCOUNTER — Encounter (INDEPENDENT_AMBULATORY_CARE_PROVIDER_SITE_OTHER): Payer: Self-pay | Admitting: Nurse Practitioner

## 2021-03-22 ENCOUNTER — Ambulatory Visit (INDEPENDENT_AMBULATORY_CARE_PROVIDER_SITE_OTHER): Payer: Medicare Other | Admitting: Nurse Practitioner

## 2021-03-22 ENCOUNTER — Other Ambulatory Visit: Payer: Self-pay

## 2021-03-22 VITALS — BP 136/80 | HR 57 | Ht 66.0 in | Wt 193.0 lb

## 2021-03-22 DIAGNOSIS — I10 Essential (primary) hypertension: Secondary | ICD-10-CM

## 2021-03-22 DIAGNOSIS — K219 Gastro-esophageal reflux disease without esophagitis: Secondary | ICD-10-CM

## 2021-03-22 DIAGNOSIS — I890 Lymphedema, not elsewhere classified: Secondary | ICD-10-CM

## 2021-03-27 ENCOUNTER — Encounter (INDEPENDENT_AMBULATORY_CARE_PROVIDER_SITE_OTHER): Payer: Self-pay | Admitting: Nurse Practitioner

## 2021-03-27 NOTE — Progress Notes (Signed)
Cardiology Office Note  Date:  03/28/2021   ID:  April [name], DOB [birth year], MRN [id number]  PCP:  Einar Pheasant, MD   Chief Complaint  Patient presents with   6 month follow up     Patient c/o shortness of breath & LE edema. Medications reviewed by the patient verbally.     HPI:  April [name] is a pleasant 80 year-old woman with history of  coronary artery disease,  chronic shortness of breath  STEMI November 2017 stent x2 to her RCA atrial fibrillation while in the hospital November 2017 elevated CHADS VASC (at least 4) who presents for follow-up of her coronary artery disease , atrial fib  Has noticed some increasing shortness of breath Worsening lymphedema Was in wraps, now off, swelling getting worse Uses lymph pumps at home  On lasix 40 in AM, missing afternoon doses here and there Was taking half dose every other day, increased up to a whole dose in the p.m.  Mild tremor, etiology unclear, muscle fatigue  Weight stable 195, up slightly  Denies chest pain concerning for angina No tachypalpitations concerning for arrhythmia  Recent lab work reviewed A1C 6.1 CR 1.03  EKG personally reviewed by myself on todays visit Shows sinus bradycardia rate 55 bpm left bundle branch block  Other past medical history reviewed Prior stenting details from 2017 CAD S/P PCI pRCA Promus DES 3.5 x 16 (4.1 mm), ostRPDA Promus DES 2.5 x 12 (2.7 mm)  done at [facility] by report  Chronic weakness with speech Previously completed speech therapy   chronic back pain Previously seen by Dr. Arnoldo Morale in [place]  chest pain while she was at the beach 2019 Took nitroglycerin x2, did not seem to help Went to urgent care initially Was sent to hospital 01/2018 Was seen at [facility], there was troponin elevation 0.07 down to 0.049 on October 7 and 8   Echo  1. Normal biventricular systolic function and size. LVEF 55-60%.  2. No significant valvular  dysfunction.  3. Grade II diastolic dysfunction with elevated left-sided filling  pressures.  4. RVSP 41 mmHg consistent with mild pulmonary hypertension  Full cardiac catheterization report is not available but discharge summary reports stents were patent to the RCA    PMH:   has a past medical history of CAD S/P percutaneous coronary angioplasty, CKD (chronic kidney disease), stage III ([place]), Diastolic dysfunction, Diverticulitis, Dyspnea, Endometriosis, Family history of adverse reaction to anesthesia, GERD (gastroesophageal reflux disease), Hiatal hernia, History of colon polyps (08/1994), History of Migraines, Hypercholesterolemia, Hypertension, LBBB (left bundle branch block), Osteoporosis, PAF (paroxysmal atrial fibrillation) ([place]), Rheumatic fever, Sleep apnea, Spasmodic dysphonia, ST elevation myocardial infarction (STEMI) of inferolateral wall, initial episode of care ([place]) (03/25/2016), and Urine incontinence.  PSH:    Past Surgical History:  Procedure Laterality Date   ABDOMINAL HYSTERECTOMY     ovaries not removed   APPENDECTOMY     was removed during hysterectomy   Breast biopsies     x2   BREAST EXCISIONAL BIOPSY Bilateral "years ago"   neg   BREAST SURGERY Bilateral    BROW LIFT Bilateral 08/15/2019   Procedure: BLEPHAROPLASTY UPPER EYELID; W/EXCESS SKIN BLEPHAROPTOSIS REPAIR; RESECT EX;  Surgeon: Karle Starch, MD;  Location: [place];  Service: Ophthalmology;  Laterality: Bilateral;  sleep apnea   CARDIAC CATHETERIZATION  2011   moderate 40% RCA disease   CARDIAC CATHETERIZATION  01/2010   Dr. Linnie [email or web address] : Only noted 40% RCA   CARDIAC CATHETERIZATION  N/A 03/25/2016   Procedure: Left Heart Cath and Coronary Angiography;  Surgeon: Leonie Man, MD;  Location: [place] [facility];  Service: Cardiovascular;  Laterality: N/A;   CARDIAC CATHETERIZATION N/A 03/25/2016   Procedure: Coronary Stent Intervention;  Surgeon: Leonie Man, MD;  Location: [place]  CV LAB;  Service: Cardiovascular;  Laterality: N/A;   COLONOSCOPY  2013   ESOPHAGOGASTRODUODENOSCOPY (EGD) WITH PROPOFOL N/A 03/17/2015   Procedure: ESOPHAGOGASTRODUODENOSCOPY (EGD) WITH PROPOFOL;  Surgeon: Christene Lye, MD;  Location: [facility] ENDOSCOPY;  Service: Gastroenterology;  Laterality: N/A;   NM MYOVIEW ([place] HX)  07/2015   No evidence ischemia or infarction. EF 66%. Low risk   TONSILLECTOMY     TRANSTHORACIC ECHOCARDIOGRAM  10/2013   Normal LV size and function. EF 55-65%. GR 1 DD. Otherwise normal.    Current Outpatient Medications  Medication Sig Dispense Refill   acetaminophen (TYLENOL) 500 MG tablet Take 500 mg by mouth daily as needed for headache (pain).     albuterol (PROVENTIL HFA;VENTOLIN HFA) 108 (90 Base) MCG/ACT inhaler Inhale 2 puffs into the lungs every 6 (six) hours as needed for wheezing or shortness of breath. 1 Inhaler 6   amiodarone (PACERONE) 200 MG tablet TAKE 1 TABLET BY MOUTH DAILY 90 tablet 0   aspirin EC 81 MG tablet Take 1 tablet (81 mg total) by mouth daily. 90 tablet 3   Azelastine HCl 137 MCG/SPRAY SOLN USE 1 TO 2 SPRAYS IN EACH NOSTRIL TWICE A DAY AS NEEDED FOR DRAINAGE 90 mL 0   cholecalciferol (VITAMIN D) 1000 units tablet Take 1,000 Units by mouth 2 (two) times daily.     ELIQUIS 5 MG TABS tablet TAKE 1 TABLET BY MOUTH TWICE A DAY 180 tablet 1   fish oil-omega-3 fatty acids 1000 MG capsule Take 1 g by mouth 2 (two) times daily.      fluticasone-salmeterol (ADVAIR HFA) 230-21 MCG/ACT inhaler Inhale 2 puffs into the lungs 2 (two) times daily. 1 each 12   furosemide (LASIX) 40 MG tablet Take 1 tablet (40 mg total) by mouth 2 (two) times daily. Dr. Rockey Situ advised alternate 40 mg twice a day with 40 mg daily every other day 180 tablet 3   hydrALAZINE (APRESOLINE) 10 MG tablet TAKE ONE TABLET BY MOUTH THREE TIMES A DAY 270 tablet 0   losartan (COZAAR) 100 MG tablet TAKE 1 TABLET BY MOUTH DAILY 90 tablet 0   Misc Natural Products (OSTEO BI-FLEX JOINT  SHIELD PO) Take 1 tablet by mouth 2 (two) times daily.      Multiple Vitamin (MULTIVITAMIN WITH MINERALS) TABS tablet Take 0.5 tablets by mouth 2 (two) times daily.     nitroGLYCERIN (NITROSTAT) 0.4 MG SL tablet Place 1 tablet (0.4 mg total) under the tongue every 5 (five) minutes as needed. 25 tablet 0   pantoprazole (PROTONIX) 40 MG tablet TAKE 1 TABLET BY MOUTH DAILY 30 tablet 4   Polyethyl Glycol-Propyl Glycol (SYSTANE OP) Place 1 drop into both eyes daily as needed (dry eyes).     potassium chloride (KLOR-CON) 10 MEQ tablet TAKE 1 TABLET BY MOUTH TWICE A DAY 180 tablet 0   PRESCRIPTION MEDICATION Inhale into the lungs at bedtime. CPAP     propranolol (INDERAL) 20 MG tablet TAKE ONE TABLET BY MOUTH THREE TIMES A DAY 270 tablet 0   rosuvastatin (CRESTOR) 40 MG tablet TAKE 1 TABLET BY MOUTH DAILY 90 tablet 0   sucralfate (CARAFATE) 1 g tablet Take 1 g by mouth 2 (  two) times daily.     traMADol (ULTRAM) 50 MG tablet Take 1 every 4-6 hours as needed for pain not controlled by Tylenol 6 tablet 0   cephALEXin (KEFLEX) 500 MG capsule Take 1 capsule (500 mg total) by mouth 3 (three) times daily. (Patient not taking: Reported on 03/28/2021) 30 capsule 0   doxycycline (VIBRA-TABS) 100 MG tablet Take 100 mg by mouth 2 (two) times daily. (Patient not taking: Reported on 03/28/2021)     doxycycline (VIBRAMYCIN) 100 MG capsule Take 1 capsule (100 mg total) by mouth 2 (two) times daily. (Patient not taking: Reported on 03/28/2021) 20 capsule 0   No current facility-administered medications for this visit.     Allergies:   Penicillins and Sulfa antibiotics   Social History:  The patient  reports that she has never smoked. She has never used smokeless tobacco. She reports that she does not drink alcohol and does not use drugs.   Family History:   family history includes Arthritis in her mother; Breast cancer in an other family member; Heart attack in her daughter, father, and sister; Heart disease in her  father; Hypertension in her mother; Osteoporosis in her mother; Stroke in her brother, daughter, and mother.    Review of Systems: Review of Systems  HENT: Negative.    Respiratory:  Positive for shortness of breath.   Cardiovascular:  Positive for leg swelling.  Gastrointestinal: Negative.   Musculoskeletal: Negative.   Neurological: Negative.   Psychiatric/Behavioral: Negative.    All other systems reviewed and are negative.  PHYSICAL EXAM: VS:  BP (!) 140/58 (BP Location: Left Arm, Patient Position: Sitting, Cuff Size: Normal)   Pulse (!) 55   Ht 5' (1.524 m)   Wt 195 lb 2 oz (88.5 kg)   LMP 05/01/1972   SpO2 98%   BMI 38.11 kg/m  , BMI Body mass index is 38.11 kg/m. Constitutional:  oriented to person, place, and time. No distress.  HENT:  Head: Grossly normal Eyes:  no discharge. No scleral icterus.  Neck: No JVD, no carotid bruits  Cardiovascular: Regular rate and rhythm, no murmurs appreciated Severe lymphedema noted Pulmonary/Chest: Clear to auscultation bilaterally, no wheezes or rails Abdominal: Soft.  no distension.  no tenderness.  Musculoskeletal: Normal range of motion Neurological:  normal muscle tone. Coordination normal. No atrophy Skin: Skin warm and dry Psychiatric: normal affect, pleasant  Recent Labs: 01/20/2021: ALT 36; BUN 20; Creatinine, Ser 1.20; Hemoglobin 12.9; Platelets 197.0; Potassium 4.6; Sodium 140; TSH 5.15    Lipid Panel Lab Results  Component Value Date   CHOL 129 01/20/2021   HDL 50.90 01/20/2021   LDLCALC 62 01/20/2021   TRIG 80.0 01/20/2021      Wt Readings from Last 3 Encounters:  03/28/21 195 lb 2 oz (88.5 kg)  03/22/21 193 lb (87.5 kg)  02/17/21 193 lb (87.5 kg)     ASSESSMENT AND PLAN:  Essential hypertension, benign Blood pressure is well controlled on today's visit. No changes made to the medications.  Hypercholesterolemia Cholesterol at goal, no changes  Coronary artery disease involving native coronary  artery of native heart with unstable angina pectoris (HCC) Currently with no symptoms of angina. No further workup at this time. Continue current medication regimen. Consider echocardiogram as detailed below  Paroxysmal atrial fibrillation Tolerating anticoagulation, maintain normal sinus rhythm elevated CHADS VASC (at least 4) On eliquis BID Heart rate stable  SOB (shortness of breath) Recommend weight loss, walking for conditioning Recommend 1 week of Lasix 40  twice daily then back to 40 in the morning, 40 in the afternoon every other day (or 20 in the p.m. daily but she is missing doses) Consider echocardiogram  Total encounter time more than 25 minutes Greater than 50% was spent in counseling and coordination of care with the patient    Orders Placed This Encounter  Procedures   EKG 12-Lead     Signed, Esmond Plants, M.D., Ph.D. 03/28/2021  [place], [place]

## 2021-03-27 NOTE — Progress Notes (Signed)
Subjective:    Patient ID: April [name], female    DOB: 1941/04/16, 80 y.o.   MRN: [id number] Chief Complaint  Patient presents with   Follow-up    4 wk rt unna boot check    April [name] is an 80 year old female that returns today for evaluation of her lower extremities following 4 weeks of Unna wraps.  There were initially placed due to a small wound for which she was weeping.  Today the wound is scabbed over and the bleeding/weeping is stopped.  There is no evidence of cellulitis.  Overall the patient swelling is under good control today.   Review of Systems  Cardiovascular:  Positive for leg swelling.  Skin:  Positive for wound.  All other systems reviewed and are negative.     Objective:   Physical Exam Vitals reviewed.  HENT:     Head: Normocephalic.  Cardiovascular:     Rate and Rhythm: Normal rate.     Pulses: Normal pulses.  Pulmonary:     Effort: Pulmonary effort is normal.  Musculoskeletal:     Right lower leg: 1+ Edema present.     Left lower leg: 1+ Edema present.  Skin:    General: Skin is warm and dry.  Neurological:     Mental Status: She is alert and oriented to person, place, and time.  Psychiatric:        Mood and Affect: Mood normal.        Behavior: Behavior normal.        Thought Content: Thought content normal.        Judgment: Judgment normal.    BP 136/80   Pulse (!) 57   Ht 5\' 6"  (1.676 m)   Wt 193 lb (87.5 kg)   LMP 05/01/1972   BMI 31.15 kg/m   Past Medical History:  Diagnosis Date   CAD S/P percutaneous coronary angioplasty    a. 07/2015 MV: No ischemia, EF 66%;  b. 03/2016 Inflat STEMI/PCI: LM nl, LAD 25d, RI nl, LCX nl, OM1/2 nl, RCA 80p (3.5x16 Promus Premier DES), 50p/m, 30d, RPDA 90 (2.5x12 Promu Premier DES); c. 01/2018 Cath ([place], [place]): Patent RCA stents->Med Rx.   CKD (chronic kidney disease), stage III ([place])    Diastolic dysfunction    a. 10/2013 Echo: EF 55-65%, Gr1 DD; b. 01/2018 Echo ([place], [place]): EF 55-60%, Gr2 DD, RVSP 73mmHg.   Diverticulitis    Dyspnea    Endometriosis    Family history of adverse reaction to anesthesia    Mother - had to stay in ICU because of breathing difficulties every time she had anesthesia   GERD (gastroesophageal reflux disease)    Hiatal hernia    History of colon polyps 08/1994   History of Migraines    Hypercholesterolemia    Hypertension    LBBB (left bundle branch block)    Osteoporosis    PAF (paroxysmal atrial fibrillation) ([place])    a. 03/2016 Dx @ time of MI; b. CHA2DS2VASc = 5-->Eliquis.   Rheumatic fever    Sleep apnea    CPAP   Spasmodic dysphonia    ST elevation myocardial infarction (STEMI) of inferolateral wall, initial episode of care ([place]) 03/25/2016   Urine incontinence     Social History   Socioeconomic History   Marital status: Widowed    Spouse name: Not on file   Number of children: 2   Years of education: Not on file  Highest education level: Not on file  Occupational History   Not on file  Tobacco Use   Smoking status: Never   Smokeless tobacco: Never  Vaping Use   Vaping Use: Never used  Substance and Sexual Activity   Alcohol use: No    Alcohol/week: 0.0 standard drinks   Drug use: No   Sexual activity: Not Currently    Birth control/protection: Post-menopausal  Other Topics Concern   Not on file  Social History Narrative   Not on file   Social Determinants of Health   Financial Resource Strain: Not on file  Food Insecurity: Not on file  Transportation Needs: Not on file  Physical Activity: Not on file  Stress: Not on file  Social Connections: Not on file  Intimate Partner Violence: Not on file    Past Surgical History:  Procedure Laterality Date   ABDOMINAL HYSTERECTOMY     ovaries not removed   APPENDECTOMY     was removed during hysterectomy   Breast biopsies     x2   BREAST EXCISIONAL BIOPSY Bilateral "years ago"   neg   BREAST SURGERY Bilateral    BROW LIFT  Bilateral 08/15/2019   Procedure: BLEPHAROPLASTY UPPER EYELID; W/EXCESS SKIN BLEPHAROPTOSIS REPAIR; RESECT EX;  Surgeon: Karle Starch, MD;  Location: [place];  Service: Ophthalmology;  Laterality: Bilateral;  sleep apnea   CARDIAC CATHETERIZATION  2011   moderate 40% RCA disease   CARDIAC CATHETERIZATION  01/2010   Dr. [email or web address] : Only noted 40% RCA   CARDIAC CATHETERIZATION N/A 03/25/2016   Procedure: Left Heart Cath and Coronary Angiography;  Surgeon: 04/07/2016, MD;  Location: [place] [facility];  Service: Cardiovascular;  Laterality: N/A;   CARDIAC CATHETERIZATION N/A 03/25/2016   Procedure: Coronary Stent Intervention;  Surgeon: 04/07/2016, MD;  Location: [place] [facility];  Service: Cardiovascular;  Laterality: N/A;   COLONOSCOPY  2013   ESOPHAGOGASTRODUODENOSCOPY (EGD) WITH PROPOFOL N/A 03/17/2015   Procedure: ESOPHAGOGASTRODUODENOSCOPY (EGD) WITH PROPOFOL;  Surgeon: 03/30/2015, MD;  Location: [facility];  Service: Gastroenterology;  Laterality: N/A;   NM MYOVIEW ([place] HX)  07/2015   No evidence ischemia or infarction. EF 66%. Low risk   TONSILLECTOMY     TRANSTHORACIC ECHOCARDIOGRAM  10/2013   Normal LV size and function. EF 55-65%. GR 1 DD. Otherwise normal.    Family History  Problem Relation Age of Onset   Arthritis Mother    Stroke Mother    Hypertension Mother    Osteoporosis Mother    Heart disease Father        MI   Heart attack Father    Stroke Brother    Heart attack Sister    Breast cancer Other        first cousin x 2   Stroke Daughter    Heart attack Daughter    Colon cancer Neg Hx     Allergies  Allergen Reactions   Penicillins Other (See Comments)    Okay to take amoxicillin/(pt does not recall what the reaction to penicillin was (28-1 years old)    Sulfa Antibiotics Rash    CBC Latest Ref Rng & Units 01/20/2021 10/04/2020 11/04/2019  WBC 4.0 - 10.5 K/uL 6.2 9.2 5.5  Hemoglobin 12.0 - 15.0 g/dL 12.9 12.6 12.4   Hematocrit 36.0 - 46.0 % 39.4 37.7 37.5  Platelets 150.0 - 400.0 K/uL 197.0 194 227.0      CMP     Component Value Date/Time  NA 140 01/20/2021 0920   NA 147 (H) 04/29/2018 1058   K 4.6 01/20/2021 0920   K 3.9 03/23/2014 1617   CL 103 01/20/2021 0920   CO2 29 01/20/2021 0920   GLUCOSE 100 (H) 01/20/2021 0920   BUN 20 01/20/2021 0920   BUN 26 04/29/2018 1058   CREATININE 1.20 01/20/2021 0920   CALCIUM 9.2 01/20/2021 0920   PROT 6.2 01/20/2021 0920   ALBUMIN 3.5 01/20/2021 0920   AST 66 (H) 01/20/2021 0920   ALT 36 (H) 01/20/2021 0920   ALKPHOS 93 01/20/2021 0920   BILITOT 0.7 01/20/2021 0920   GFRNONAA 36 (L) 10/04/2020 2135   GFRAA 42 (L) 04/29/2018 1058     No results found.     Assessment & Plan:   1. Lymphedema The patient is advised to continue with conservative therapy.  The patient is advised to utilize either medical grade compression socks or compression wraps to help control lower extremity edema.  She is also advised to elevate her lower extremities is much as possible.  She should also continue use of her lymphedema pump.  Patient will return to the office in 3 months or sooner if issues arise.  2. Essential hypertension, benign Continue antihypertensive medications as already ordered, these medications have been reviewed and there are no changes at this time.   3. Gastroesophageal reflux disease without esophagitis Continue PPI as already ordered, this medication has been reviewed and there are no changes at this time.  Avoidence of caffeine and alcohol  Moderate elevation of the head of the bed     Current Outpatient Medications on File Prior to Visit  Medication Sig Dispense Refill   acetaminophen (TYLENOL) 500 MG tablet Take 500 mg by mouth daily as needed for headache (pain).     albuterol (PROVENTIL HFA;VENTOLIN HFA) 108 (90 Base) MCG/ACT inhaler Inhale 2 puffs into the lungs every 6 (six) hours as needed for wheezing or shortness of breath. 1  Inhaler 6   amiodarone (PACERONE) 200 MG tablet TAKE 1 TABLET BY MOUTH DAILY 90 tablet 0   aspirin EC 81 MG tablet Take 1 tablet (81 mg total) by mouth daily. 90 tablet 3   Azelastine HCl 137 MCG/SPRAY SOLN USE 1 TO 2 SPRAYS IN EACH NOSTRIL TWICE A DAY AS NEEDED FOR DRAINAGE 90 mL 0   cephALEXin (KEFLEX) 500 MG capsule Take 1 capsule (500 mg total) by mouth 3 (three) times daily. 30 capsule 0   cholecalciferol (VITAMIN D) 1000 units tablet Take 1,000 Units by mouth 2 (two) times daily.     doxycycline (VIBRA-TABS) 100 MG tablet Take 100 mg by mouth 2 (two) times daily.     doxycycline (VIBRAMYCIN) 100 MG capsule Take 1 capsule (100 mg total) by mouth 2 (two) times daily. 20 capsule 0   ELIQUIS 5 MG TABS tablet TAKE 1 TABLET BY MOUTH TWICE A DAY 180 tablet 1   fish oil-omega-3 fatty acids 1000 MG capsule Take 1 g by mouth 2 (two) times daily.      fluticasone-salmeterol (ADVAIR HFA) 230-21 MCG/ACT inhaler Inhale 2 puffs into the lungs 2 (two) times daily. 1 each 12   furosemide (LASIX) 40 MG tablet Take 1 tablet (40 mg total) by mouth 2 (two) times daily. Dr. Rockey Situ advised alternate 40 mg twice a day with 40 mg daily every other day 180 tablet 3   hydrALAZINE (APRESOLINE) 10 MG tablet TAKE ONE TABLET BY MOUTH THREE TIMES A DAY 270 tablet 0  losartan (COZAAR) 100 MG tablet TAKE 1 TABLET BY MOUTH DAILY 90 tablet 0   Misc Natural Products (OSTEO BI-FLEX JOINT SHIELD PO) Take 1 tablet by mouth 2 (two) times daily.      Multiple Vitamin (MULTIVITAMIN WITH MINERALS) TABS tablet Take 0.5 tablets by mouth 2 (two) times daily.     nitroGLYCERIN (NITROSTAT) 0.4 MG SL tablet Place 1 tablet (0.4 mg total) under the tongue every 5 (five) minutes as needed. 25 tablet 0   pantoprazole (PROTONIX) 40 MG tablet TAKE 1 TABLET BY MOUTH DAILY 30 tablet 4   Polyethyl Glycol-Propyl Glycol (SYSTANE OP) Place 1 drop into both eyes daily as needed (dry eyes).     potassium chloride (KLOR-CON) 10 MEQ tablet TAKE 1 TABLET  BY MOUTH TWICE A DAY 180 tablet 0   PRESCRIPTION MEDICATION Inhale into the lungs at bedtime. CPAP     propranolol (INDERAL) 20 MG tablet TAKE ONE TABLET BY MOUTH THREE TIMES A DAY 270 tablet 0   rosuvastatin (CRESTOR) 40 MG tablet TAKE 1 TABLET BY MOUTH DAILY 90 tablet 0   sucralfate (CARAFATE) 1 g tablet Take 1 g by mouth 2 (two) times daily.     traMADol (ULTRAM) 50 MG tablet Take 1 every 4-6 hours as needed for pain not controlled by Tylenol 6 tablet 0   No current facility-administered medications on file prior to visit.    There are no Patient Instructions on file for this visit. No follow-ups on file.   Kris Hartmann, NP

## 2021-03-28 ENCOUNTER — Other Ambulatory Visit: Payer: Self-pay

## 2021-03-28 ENCOUNTER — Encounter: Payer: Self-pay | Admitting: Cardiovascular Disease

## 2021-03-28 ENCOUNTER — Ambulatory Visit (INDEPENDENT_AMBULATORY_CARE_PROVIDER_SITE_OTHER): Payer: Medicare Other | Admitting: Cardiovascular Disease

## 2021-03-28 VITALS — BP 140/58 | HR 55 | Ht 60.0 in | Wt 195.1 lb

## 2021-03-28 DIAGNOSIS — N183 Chronic kidney disease, stage 3 unspecified: Secondary | ICD-10-CM | POA: Diagnosis not present

## 2021-03-28 DIAGNOSIS — E785 Hyperlipidemia, unspecified: Secondary | ICD-10-CM | POA: Diagnosis not present

## 2021-03-28 DIAGNOSIS — I480 Paroxysmal atrial fibrillation: Secondary | ICD-10-CM | POA: Diagnosis not present

## 2021-03-28 DIAGNOSIS — E7800 Pure hypercholesterolemia, unspecified: Secondary | ICD-10-CM | POA: Diagnosis not present

## 2021-03-28 DIAGNOSIS — I10 Essential (primary) hypertension: Secondary | ICD-10-CM

## 2021-03-28 DIAGNOSIS — Z9861 Coronary angioplasty status: Secondary | ICD-10-CM

## 2021-03-28 DIAGNOSIS — I2510 Atherosclerotic heart disease of native coronary artery without angina pectoris: Secondary | ICD-10-CM | POA: Diagnosis not present

## 2021-03-28 DIAGNOSIS — I25118 Atherosclerotic heart disease of native coronary artery with other forms of angina pectoris: Secondary | ICD-10-CM | POA: Diagnosis not present

## 2021-03-28 DIAGNOSIS — I5032 Chronic diastolic (congestive) heart failure: Secondary | ICD-10-CM | POA: Diagnosis not present

## 2021-03-28 DIAGNOSIS — E1159 Type 2 diabetes mellitus with other circulatory complications: Secondary | ICD-10-CM

## 2021-03-28 NOTE — Patient Instructions (Addendum)
Medication Instructions:  Please try  lasix 40 mg twice a day for a week,  then back to 40 mg daily/alternating with 40 mg twice a day See if this help breathing and swelling  If you need a refill on your cardiac medications before your next appointment, please call your pharmacy.   Lab work: No new labs needed  Testing/Procedures: No new testing needed  Follow-Up: At [facility], you and your health needs are our priority.  As part of our continuing mission to provide you with exceptional heart care, we have created designated Provider Care Teams.  These Care Teams include your primary Cardiologist (physician) and Advanced Practice Providers (APPs -  Physician Assistants and Nurse Practitioners) who all work together to provide you with the care you need, when you need it.  You will need a follow up appointment in 6 months  Providers on your designated Care Team:   Murray Hodgkins, NP Christell Faith, PA-C Cadence Kathlen Mody, [place]  COVID-19 Vaccine Information can be found at: ShippingScam.co.uk For questions related to vaccine distribution or appointments, please email vaccine@[facility] .com or call [phone].

## 2021-04-01 ENCOUNTER — Ambulatory Visit: Payer: Medicare Other | Admitting: Internal Medicine

## 2021-04-06 ENCOUNTER — Ambulatory Visit (INDEPENDENT_AMBULATORY_CARE_PROVIDER_SITE_OTHER): Payer: Medicare Other | Admitting: Internal Medicine

## 2021-04-06 ENCOUNTER — Other Ambulatory Visit: Payer: Self-pay

## 2021-04-06 ENCOUNTER — Telehealth: Payer: Self-pay | Admitting: Internal Medicine

## 2021-04-06 VITALS — BP 138/72 | HR 60 | Temp 98.5°F | Ht 60.0 in | Wt 192.0 lb

## 2021-04-06 DIAGNOSIS — R600 Localized edema: Secondary | ICD-10-CM | POA: Diagnosis not present

## 2021-04-06 DIAGNOSIS — E1159 Type 2 diabetes mellitus with other circulatory complications: Secondary | ICD-10-CM | POA: Diagnosis not present

## 2021-04-06 DIAGNOSIS — I700 Atherosclerosis of aorta: Secondary | ICD-10-CM | POA: Diagnosis not present

## 2021-04-06 DIAGNOSIS — G4733 Obstructive sleep apnea (adult) (pediatric): Secondary | ICD-10-CM

## 2021-04-06 DIAGNOSIS — E7800 Pure hypercholesterolemia, unspecified: Secondary | ICD-10-CM | POA: Diagnosis not present

## 2021-04-06 DIAGNOSIS — I10 Essential (primary) hypertension: Secondary | ICD-10-CM

## 2021-04-06 DIAGNOSIS — Z860100 Personal history of colon polyps, unspecified: Secondary | ICD-10-CM

## 2021-04-06 DIAGNOSIS — R0602 Shortness of breath: Secondary | ICD-10-CM

## 2021-04-06 DIAGNOSIS — I480 Paroxysmal atrial fibrillation: Secondary | ICD-10-CM

## 2021-04-06 DIAGNOSIS — K219 Gastro-esophageal reflux disease without esophagitis: Secondary | ICD-10-CM | POA: Diagnosis not present

## 2021-04-06 DIAGNOSIS — I25110 Atherosclerotic heart disease of native coronary artery with unstable angina pectoris: Secondary | ICD-10-CM

## 2021-04-06 DIAGNOSIS — D6859 Other primary thrombophilia: Secondary | ICD-10-CM | POA: Diagnosis not present

## 2021-04-06 DIAGNOSIS — I5032 Chronic diastolic (congestive) heart failure: Secondary | ICD-10-CM | POA: Diagnosis not present

## 2021-04-06 DIAGNOSIS — I890 Lymphedema, not elsewhere classified: Secondary | ICD-10-CM

## 2021-04-06 DIAGNOSIS — N183 Chronic kidney disease, stage 3 unspecified: Secondary | ICD-10-CM

## 2021-04-06 DIAGNOSIS — R7989 Other specified abnormal findings of blood chemistry: Secondary | ICD-10-CM

## 2021-04-06 DIAGNOSIS — Z9861 Coronary angioplasty status: Secondary | ICD-10-CM

## 2021-04-06 DIAGNOSIS — I2510 Atherosclerotic heart disease of native coronary artery without angina pectoris: Secondary | ICD-10-CM

## 2021-04-06 DIAGNOSIS — K449 Diaphragmatic hernia without obstruction or gangrene: Secondary | ICD-10-CM

## 2021-04-06 LAB — HEPATIC FUNCTION PANEL
ALT: 33 U/L (ref 0–35)
AST: 61 U/L — ABNORMAL HIGH (ref 0–37)
Albumin: 3.3 g/dL — ABNORMAL LOW (ref 3.5–5.2)
Alkaline Phosphatase: 107 U/L (ref 39–117)
Bilirubin, Direct: 0.2 mg/dL (ref 0.0–0.3)
Total Bilirubin: 0.8 mg/dL (ref 0.2–1.2)
Total Protein: 6.8 g/dL (ref 6.0–8.3)

## 2021-04-06 LAB — TSH: TSH: 3.91 u[IU]/mL (ref 0.35–5.50)

## 2021-04-06 LAB — BASIC METABOLIC PANEL
BUN: 20 mg/dL (ref 6–23)
CO2: 29 mEq/L (ref 19–32)
Calcium: 8.9 mg/dL (ref 8.4–10.5)
Chloride: 105 mEq/L (ref 96–112)
Creatinine, Ser: 1.19 mg/dL (ref 0.40–1.20)
GFR: 42.6 mL/min — ABNORMAL LOW (ref >60.00)
Glucose, Bld: 98 mg/dL (ref 70–99)
Potassium: 3.8 mEq/L (ref 3.5–5.1)
Sodium: 139 mEq/L (ref 135–145)

## 2021-04-06 MED ORDER — TRAMADOL HCL 50 MG PO TABS: 50.0000 mg | ORAL_TABLET | Freq: Two times a day (BID) | ORAL | 0 refills | Status: DC | PRN

## 2021-04-06 NOTE — Telephone Encounter (Signed)
Sig reads on tramadol.  Sig: Take 1 tablet (50 mg total) by mouth 2 (two) times daily as needed. Take 1 every 4-6 hours as needed for pain not controlled by Tylenol   Please confirm which direction is correct and resend .

## 2021-04-06 NOTE — Telephone Encounter (Signed)
Dawn from [employer] called in stating that medication (traMADol (ULTRAM) 50 MG tablet) has two sets a direction. Dawn would like clarification on the directions. Arrie Aran is requesting callback at [phone]

## 2021-04-06 NOTE — Progress Notes (Signed)
Patient ID: April [name], female   DOB: 1940-09-12, 80 y.o.   MRN: [id number]   Subjective:    Patient ID: April [name], female    DOB: April 07, 1941, 80 y.o.   MRN: [id number]  This visit occurred during the SARS-CoV-2 public health emergency.  Safety protocols were in place, including screening questions prior to the visit, additional usage of staff PPE, and extensive cleaning of exam room while observing appropriate contact time as indicated for disinfecting solutions.   Patient here for a scheduled follow up.    Marland Kitchen   HPI Here to follow up regarding her blood pressure, cholesterol and her breathing.  Saw Dr Rockey Situ 03/28/21.  Has chronic sob.  Reported some worsening.  He increased her lasix to 72m bid for one week and then decreased back to 470min am and 2076m pm or 51m26md pm .  Also consider - f/u echo.  Continues on eliquis.  Appears heart rate stable.  Advair made her feel bad.  Off now.  Has rescue inhaler.  Still with some occasional queeziness and LLQ discomfort.  Sees GI.  Was previously placed on carafate.  Per note, felt helped some.  Ultrasound 03/2019 and CT 11/2020 - unremarkable.  Has a large hiatal hernia.  Elected to hold on EGD at that time.  Was recommended to continue protonix and carafate (bid).  Is overall better, but still with flares.  Persistent lower extremity swelling.  Uses lymphedema pump.  Followed by vascular surgery.    Past Medical History:  Diagnosis Date   CAD S/P percutaneous coronary angioplasty    a. 07/2015 MV: No ischemia, EF 66%;  b. 03/2016 Inflat STEMI/PCI: LM nl, LAD 25d, RI nl, LCX nl, OM1/2 nl, RCA 80p (3.5x16 Promus Premier DES), 50p/m, 30d, RPDA 90 (2.5x12 Promu Premier DES); c. 01/2018 Cath ([place]):[place] RCA stents->Med Rx.   CKD (chronic kidney disease), stage III (HCC)[place] Diastolic dysfunction    a. 10/2013 Echo: EF 55-65%, Gr1 DD; b. 01/2018 Echo ([place]):[place]askaF 55-60%, Gr2 DD, RVSP 41mm49m   Diverticulitis    Dyspnea    Endometriosis    Family history of adverse reaction to anesthesia    Mother - had to stay in ICU because of breathing difficulties every time she had anesthesia   GERD (gastroesophageal reflux disease)    Hiatal hernia    History of colon polyps 08/1994   History of Migraines    Hypercholesterolemia    Hypertension    LBBB (left bundle branch block)    Osteoporosis    PAF (paroxysmal atrial fibrillation) (HCC) [place]. 03/2016 Dx @ time of MI; b. CHA2DS2VASc = 5-->Eliquis.   Rheumatic fever    Sleep apnea    CPAP   Spasmodic dysphonia    ST elevation myocardial infarction (STEMI) of inferolateral wall, initial episode of care (HCC)[facility]25/2017   Urine incontinence    Past Surgical History:  Procedure Laterality Date   ABDOMINAL HYSTERECTOMY     ovaries not removed   APPENDECTOMY     was removed during hysterectomy   Breast biopsies     x2   BREAST EXCISIONAL BIOPSY Bilateral "years ago"   neg   BREAST SURGERY Bilateral    BROW LIFT Bilateral 08/15/2019   Procedure: BLEPHAROPLASTY UPPER EYELID; W/EXCESS SKIN BLEPHAROPTOSIS REPAIR; RESECT EX;  Surgeon: FowleKarle Starch  [facility];  Laterality:  Bilateral;  sleep apnea   CARDIAC CATHETERIZATION  2011   moderate 40% RCA disease   CARDIAC CATHETERIZATION  01/2010   Dr. Gollan@[facility] : Only noted 40% RCA   CARDIAC CATHETERIZATION N/A 03/25/2016   Procedure: Left Heart Cath and Coronary Angiography;  Surgeon: 04/07/2016, MD;  Location: [place] [facility];  Service: Cardiovascular;  Laterality: N/A;   CARDIAC CATHETERIZATION N/A 03/25/2016   Procedure: Coronary Stent Intervention;  Surgeon: 04/07/2016, MD;  Location: [place] [facility];  Service: Cardiovascular;  Laterality: N/A;   COLONOSCOPY  2013   ESOPHAGOGASTRODUODENOSCOPY (EGD) WITH PROPOFOL N/A 03/17/2015   Procedure: ESOPHAGOGASTRODUODENOSCOPY (EGD) WITH PROPOFOL;  Surgeon: 03/30/2015,  MD;  Location: [facility] ENDOSCOPY;  Service: Gastroenterology;  Laterality: N/A;   NM MYOVIEW ([place] HX)  07/2015   No evidence ischemia or infarction. EF 66%. Low risk   TONSILLECTOMY     TRANSTHORACIC ECHOCARDIOGRAM  10/2013   Normal LV size and function. EF 55-65%. GR 1 DD. Otherwise normal.   Family History  Problem Relation Age of Onset   Arthritis Mother    Stroke Mother    Hypertension Mother    Osteoporosis Mother    Heart disease Father        MI   Heart attack Father    Stroke Brother    Heart attack Sister    Breast cancer Other        first cousin x 2   Stroke Daughter    Heart attack Daughter    Colon cancer Neg Hx    Social History   Socioeconomic History   Marital status: Widowed    Spouse name: Not on file   Number of children: 2   Years of education: Not on file   Highest education level: Not on file  Occupational History   Not on file  Tobacco Use   Smoking status: Never   Smokeless tobacco: Never  Vaping Use   Vaping Use: Never used  Substance and Sexual Activity   Alcohol use: No    Alcohol/week: 0.0 standard drinks   Drug use: No   Sexual activity: Not Currently    Birth control/protection: Post-menopausal  Other Topics Concern   Not on file  Social History Narrative   Not on file   Social Determinants of Health   Financial Resource Strain: Not on file  Food Insecurity: Not on file  Transportation Needs: Not on file  Physical Activity: Not on file  Stress: Not on file  Social Connections: Not on file     Review of Systems  Constitutional:  Negative for appetite change and unexpected weight change.  HENT:  Negative for congestion and sinus pressure.   Respiratory:  Negative for cough and chest tightness.        Chronic sob.   Cardiovascular:  Positive for leg swelling. Negative for chest pain and palpitations.  Gastrointestinal:  Negative for diarrhea, nausea and vomiting.       No increased abdominal pain as outlined.    Genitourinary:  Negative for difficulty urinating and dysuria.  Musculoskeletal:  Negative for joint swelling and myalgias.  Skin:  Negative for color change and rash.  Neurological:  Negative for dizziness, light-headedness and headaches.  Psychiatric/Behavioral:  Negative for agitation and dysphoric mood.       Objective:     BP 138/72   Pulse 60   Temp 98.5 F (36.9 C)   Ht 5' (1.524 m)   Wt 192 lb (87.1  kg)   LMP 05/01/1972   SpO2 97%   BMI 37.50 kg/m  Wt Readings from Last 3 Encounters:  04/10/21 192 lb (87.1 kg)  03/28/21 195 lb 2 oz (88.5 kg)  03/22/21 193 lb (87.5 kg)    Physical Exam Vitals reviewed.  Constitutional:      General: She is not in acute distress.    Appearance: Normal appearance.  HENT:     Head: Normocephalic and atraumatic.     Right Ear: External ear normal.     Left Ear: External ear normal.  Eyes:     General: No scleral icterus.       Right eye: No discharge.        Left eye: No discharge.     Conjunctiva/sclera: Conjunctivae normal.  Neck:     Thyroid: No thyromegaly.  Cardiovascular:     Rate and Rhythm: Normal rate and regular rhythm.  Pulmonary:     Effort: No respiratory distress.     Breath sounds: Normal breath sounds. No wheezing.  Abdominal:     General: Bowel sounds are normal.     Palpations: Abdomen is soft.     Tenderness: There is no abdominal tenderness.  Musculoskeletal:        General: No tenderness.     Cervical back: Neck supple. No tenderness.     Comments: Chronic lymphedema - bilateral feet/legs.   Lymphadenopathy:     Cervical: No cervical adenopathy.  Skin:    Findings: No erythema or rash.  Neurological:     Mental Status: She is alert.  Psychiatric:        Mood and Affect: Mood normal.        Behavior: Behavior normal.     Outpatient Encounter Medications as of 04/06/2021  Medication Sig   acetaminophen (TYLENOL) 500 MG tablet Take 500 mg by mouth daily as needed for headache (pain).    albuterol (PROVENTIL HFA;VENTOLIN HFA) 108 (90 Base) MCG/ACT inhaler Inhale 2 puffs into the lungs every 6 (six) hours as needed for wheezing or shortness of breath.   amiodarone (PACERONE) 200 MG tablet TAKE 1 TABLET BY MOUTH DAILY   aspirin EC 81 MG tablet Take 1 tablet (81 mg total) by mouth daily.   Azelastine HCl 137 MCG/SPRAY SOLN USE 1 TO 2 SPRAYS IN EACH NOSTRIL TWICE A DAY AS NEEDED FOR DRAINAGE   cholecalciferol (VITAMIN D) 1000 units tablet Take 1,000 Units by mouth 2 (two) times daily.   ELIQUIS 5 MG TABS tablet TAKE 1 TABLET BY MOUTH TWICE A DAY   fish oil-omega-3 fatty acids 1000 MG capsule Take 1 g by mouth 2 (two) times daily.    fluticasone-salmeterol (ADVAIR HFA) 230-21 MCG/ACT inhaler Inhale 2 puffs into the lungs 2 (two) times daily.   furosemide (LASIX) 40 MG tablet Take 1 tablet (40 mg total) by mouth 2 (two) times daily. Dr. Rockey Situ advised alternate 40 mg twice a day with 40 mg daily every other day   hydrALAZINE (APRESOLINE) 10 MG tablet TAKE ONE TABLET BY MOUTH THREE TIMES A DAY   losartan (COZAAR) 100 MG tablet TAKE 1 TABLET BY MOUTH DAILY   Misc Natural Products (OSTEO BI-FLEX JOINT SHIELD PO) Take 1 tablet by mouth 2 (two) times daily.    Multiple Vitamin (MULTIVITAMIN WITH MINERALS) TABS tablet Take 0.5 tablets by mouth 2 (two) times daily.   nitroGLYCERIN (NITROSTAT) 0.4 MG SL tablet Place 1 tablet (0.4 mg total) under the tongue every 5 (five) minutes as  needed.   pantoprazole (PROTONIX) 40 MG tablet TAKE 1 TABLET BY MOUTH DAILY   Polyethyl Glycol-Propyl Glycol (SYSTANE OP) Place 1 drop into both eyes daily as needed (dry eyes).   potassium chloride (KLOR-CON) 10 MEQ tablet TAKE 1 TABLET BY MOUTH TWICE A DAY   PRESCRIPTION MEDICATION Inhale into the lungs at bedtime. CPAP   propranolol (INDERAL) 20 MG tablet TAKE ONE TABLET BY MOUTH THREE TIMES A DAY   rosuvastatin (CRESTOR) 40 MG tablet TAKE 1 TABLET BY MOUTH DAILY   sucralfate (CARAFATE) 1 g tablet Take 1 g by  mouth 2 (two) times daily.   [DISCONTINUED] traMADol (ULTRAM) 50 MG tablet Take 1 every 4-6 hours as needed for pain not controlled by Tylenol   [DISCONTINUED] traMADol (ULTRAM) 50 MG tablet Take 1 tablet (50 mg total) by mouth 2 (two) times daily as needed. Take 1 every 4-6 hours as needed for pain not controlled by Tylenol   No facility-administered encounter medications on file as of 04/06/2021.     Lab Results  Component Value Date   WBC 6.2 01/20/2021   HGB 12.9 01/20/2021   HCT 39.4 01/20/2021   PLT 197.0 01/20/2021   GLUCOSE 98 04/06/2021   CHOL 129 01/20/2021   TRIG 80.0 01/20/2021   HDL 50.90 01/20/2021   LDLCALC 62 01/20/2021   ALT 33 04/06/2021   AST 61 (H) 04/06/2021   NA 139 04/06/2021   K 3.8 04/06/2021   CL 105 04/06/2021   CREATININE 1.19 04/06/2021   BUN 20 04/06/2021   CO2 29 04/06/2021   TSH 3.91 04/06/2021   INR 0.99 03/25/2016   HGBA1C 6.2 01/20/2021   MICROALBUR <0.7 04/29/2020    CT Abdomen Pelvis Wo Contrast  Result Date: 11/30/2020 CLINICAL DATA:  Abdominal pain EXAM: CT ABDOMEN AND PELVIS WITHOUT CONTRAST TECHNIQUE: Multidetector CT imaging of the abdomen and pelvis was performed following the standard protocol without IV contrast. COMPARISON:  02/18/2015 FINDINGS: Lower chest: Large hiatal hernia. Contrast seen within the distal esophagus and hernia, likely reflux. Coronary artery and aortic calcifications. Hepatobiliary: No focal hepatic abnormality. Gallbladder unremarkable. Pancreas: No focal abnormality or ductal dilatation. Spleen: No focal abnormality.  Normal size. Adrenals/Urinary Tract: No adrenal abnormality. No focal renal abnormality. No stones or hydronephrosis. Urinary bladder is unremarkable. Stomach/Bowel: Left colonic diverticulosis. No active diverticulitis. No evidence of bowel obstruction. Vascular/Lymphatic: Aortic calcifications. No evidence of aneurysm or adenopathy. Reproductive: Prior hysterectomy.  No adnexal masses. Other: No free  fluid or free air. Musculoskeletal: No acute bony abnormality. IMPRESSION: Left colonic diverticulosis.  No active diverticulitis. Large hiatal hernia with probable gastroesophageal reflux. Coronary artery disease, aortic atherosclerosis. No acute findings in the abdomen or pelvis. Electronically Signed   By: Rolm Baptise M.D.   On: 11/30/2020 08:20       Assessment & Plan:   Problem List Items Addressed This Visit     Abnormal liver function tests    Being followed and worked up by GI.        Aortic atherosclerosis (HCC)    Continue crestor.       Chronic heart failure with preserved ejection fraction (HCC)    Chronic lower extremity swelling.  Continue wraps and lymphedema pump.  Weight stable.  Continue current medication regimen.        CKD (chronic kidney disease), stage III (HCC)    Avoid antiinflammatories.  Follow metabolic panel.       Coronary artery disease involving native coronary artery of native heart with  unstable angina pectoris (HCC)    No chest pain - continues hydralazine, losartan and eliquis.  Follow.       Diabetes mellitus ([place])    Low carb diet and exercise.  Follow met b and a1c.       GERD (gastroesophageal reflux disease)    Continue protonix and carafate bid.  Follow.       Hiatal hernia    Has a large hiatal hernia.  On PPI.  Follow.       History of colonic polyps    Just saw GI.  cologuard negative 2020.  Elected to hold f/u colonoscopy.        Lower extremity edema   Relevant Orders   TSH (Completed)   Basic metabolic panel (Completed)   Lymphedema    Continue wraps and lymphedema pump.  Follow.       Paroxysmal atrial fibrillation (HCC)    On eliquis and amiodarone.  Stable.  Followed by cardiology.       SOB (shortness of breath)    Chronic.  Just saw cardiology.  Lasix adjusted.  Follow.  Has albuterol if needed.        Thrombophilia (HCC)    Has afib.  On eliquis.       Essential hypertension, benign (Chronic)     Continue losartan.  Blood pressure as outlined.  Follow pressures.  Follow metabolic panel.       Hypercholesterolemia - Primary (Chronic)    On crestor.  Low cholesterol diet and exercise.  Follow lipid panel and liver function tests.        Relevant Orders   Hepatic function panel (Completed)   Obstructive sleep apnea (Chronic)    Continue cpap.         Einar Pheasant, MD

## 2021-04-06 NOTE — Telephone Encounter (Signed)
Corrected and resent.    Dr Nicki Reaper

## 2021-04-07 DIAGNOSIS — Z20822 Contact with and (suspected) exposure to covid-19: Secondary | ICD-10-CM | POA: Diagnosis not present

## 2021-04-10 ENCOUNTER — Encounter: Payer: Self-pay | Admitting: Internal Medicine

## 2021-04-10 DIAGNOSIS — D6859 Other primary thrombophilia: Secondary | ICD-10-CM | POA: Insufficient documentation

## 2021-04-10 NOTE — Assessment & Plan Note (Signed)
Being followed and worked up by GI.

## 2021-04-10 NOTE — Assessment & Plan Note (Signed)
Continue crestor 

## 2021-04-10 NOTE — Assessment & Plan Note (Signed)
Avoid antiinflammatories.  Follow metabolic panel.  

## 2021-04-10 NOTE — Assessment & Plan Note (Signed)
No chest pain - continues hydralazine, losartan and eliquis.  Follow.

## 2021-04-10 NOTE — Assessment & Plan Note (Signed)
Continue cpap.  

## 2021-04-10 NOTE — Assessment & Plan Note (Signed)
Continue wraps and lymphedema pump.  Follow.

## 2021-04-10 NOTE — Assessment & Plan Note (Signed)
Continue losartan.  Blood pressure as outlined.  Follow pressures.  Follow metabolic panel.  

## 2021-04-10 NOTE — Assessment & Plan Note (Signed)
On eliquis and amiodarone.  Stable.  Followed by cardiology.

## 2021-04-10 NOTE — Assessment & Plan Note (Signed)
Has afib.  On eliquis.

## 2021-04-10 NOTE — Assessment & Plan Note (Signed)
Has a large hiatal hernia.  On PPI. Follow.  

## 2021-04-10 NOTE — Assessment & Plan Note (Signed)
Low carb diet and exercise.  Follow met b and a1c.  

## 2021-04-10 NOTE — Assessment & Plan Note (Signed)
On crestor.  Low cholesterol diet and exercise.  Follow lipid panel and liver function tests.   

## 2021-04-10 NOTE — Assessment & Plan Note (Signed)
Just saw GI.  cologuard negative 2020.  Elected to hold f/u colonoscopy.

## 2021-04-10 NOTE — Assessment & Plan Note (Signed)
Chronic.  Just saw cardiology.  Lasix adjusted.  Follow.  Has albuterol if needed.

## 2021-04-10 NOTE — Assessment & Plan Note (Signed)
Continue protonix and carafate bid.  Follow.

## 2021-04-10 NOTE — Assessment & Plan Note (Signed)
Chronic lower extremity swelling.  Continue wraps and lymphedema pump.  Weight stable.  Continue current medication regimen.

## 2021-04-13 DIAGNOSIS — Z8619 Personal history of other infectious and parasitic diseases: Secondary | ICD-10-CM | POA: Diagnosis not present

## 2021-04-13 DIAGNOSIS — K219 Gastro-esophageal reflux disease without esophagitis: Secondary | ICD-10-CM | POA: Diagnosis not present

## 2021-04-13 DIAGNOSIS — R195 Other fecal abnormalities: Secondary | ICD-10-CM | POA: Diagnosis not present

## 2021-04-13 DIAGNOSIS — R110 Nausea: Secondary | ICD-10-CM | POA: Diagnosis not present

## 2021-04-14 ENCOUNTER — Other Ambulatory Visit: Payer: Self-pay | Admitting: Cardiovascular Disease

## 2021-05-09 DIAGNOSIS — M5136 Other intervertebral disc degeneration, lumbar region: Secondary | ICD-10-CM | POA: Diagnosis not present

## 2021-05-09 DIAGNOSIS — M5451 Vertebrogenic low back pain: Secondary | ICD-10-CM | POA: Diagnosis not present

## 2021-05-12 DIAGNOSIS — Z8619 Personal history of other infectious and parasitic diseases: Secondary | ICD-10-CM | POA: Diagnosis not present

## 2021-05-12 DIAGNOSIS — R195 Other fecal abnormalities: Secondary | ICD-10-CM | POA: Diagnosis not present

## 2021-05-12 DIAGNOSIS — K219 Gastro-esophageal reflux disease without esophagitis: Secondary | ICD-10-CM | POA: Diagnosis not present

## 2021-05-12 DIAGNOSIS — R110 Nausea: Secondary | ICD-10-CM | POA: Diagnosis not present

## 2021-05-24 ENCOUNTER — Other Ambulatory Visit: Payer: Self-pay | Admitting: Internal Medicine

## 2021-05-24 ENCOUNTER — Other Ambulatory Visit: Payer: Self-pay | Admitting: Cardiovascular Disease

## 2021-05-24 DIAGNOSIS — L821 Other seborrheic keratosis: Secondary | ICD-10-CM | POA: Diagnosis not present

## 2021-05-24 DIAGNOSIS — D2261 Melanocytic nevi of right upper limb, including shoulder: Secondary | ICD-10-CM | POA: Diagnosis not present

## 2021-05-24 DIAGNOSIS — D2262 Melanocytic nevi of left upper limb, including shoulder: Secondary | ICD-10-CM | POA: Diagnosis not present

## 2021-05-24 DIAGNOSIS — D225 Melanocytic nevi of trunk: Secondary | ICD-10-CM | POA: Diagnosis not present

## 2021-05-24 DIAGNOSIS — X32XXXA Exposure to sunlight, initial encounter: Secondary | ICD-10-CM | POA: Diagnosis not present

## 2021-05-24 DIAGNOSIS — L570 Actinic keratosis: Secondary | ICD-10-CM | POA: Diagnosis not present

## 2021-05-24 DIAGNOSIS — D2271 Melanocytic nevi of right lower limb, including hip: Secondary | ICD-10-CM | POA: Diagnosis not present

## 2021-05-24 DIAGNOSIS — D2272 Melanocytic nevi of left lower limb, including hip: Secondary | ICD-10-CM | POA: Diagnosis not present

## 2021-06-06 DIAGNOSIS — R600 Localized edema: Secondary | ICD-10-CM | POA: Diagnosis not present

## 2021-06-06 DIAGNOSIS — L03115 Cellulitis of right lower limb: Secondary | ICD-10-CM | POA: Diagnosis not present

## 2021-06-07 ENCOUNTER — Telehealth (INDEPENDENT_AMBULATORY_CARE_PROVIDER_SITE_OTHER): Payer: Self-pay

## 2021-06-07 NOTE — Telephone Encounter (Signed)
Scheduler has been made aware to reach out to pt

## 2021-06-07 NOTE — Telephone Encounter (Signed)
The pt called and left a VM on the nurses line saying she was seen at the urgent care yesterday for Bil oozing of her feet the drainage contained blood. She also had swelling and redness of her RLE an swelling of her LLE she was given cephalexin 500 Mg 2 times a day and told to follow up here. Possible Unna wraps or just follow up from recent urgent care visit. Please advise.

## 2021-06-07 NOTE — Telephone Encounter (Signed)
Bring her in for wraps, i'll peek my head in if needed

## 2021-06-08 ENCOUNTER — Ambulatory Visit (INDEPENDENT_AMBULATORY_CARE_PROVIDER_SITE_OTHER): Payer: Medicare Other | Admitting: Nurse Practitioner

## 2021-06-08 ENCOUNTER — Other Ambulatory Visit: Payer: Self-pay | Admitting: Internal Medicine

## 2021-06-08 ENCOUNTER — Other Ambulatory Visit: Payer: Self-pay

## 2021-06-08 ENCOUNTER — Encounter (INDEPENDENT_AMBULATORY_CARE_PROVIDER_SITE_OTHER): Payer: Self-pay

## 2021-06-08 ENCOUNTER — Telehealth: Payer: Self-pay | Admitting: Internal Medicine

## 2021-06-08 ENCOUNTER — Ambulatory Visit (INDEPENDENT_AMBULATORY_CARE_PROVIDER_SITE_OTHER): Payer: Medicare Other | Admitting: Internal Medicine

## 2021-06-08 ENCOUNTER — Encounter: Payer: Self-pay | Admitting: Internal Medicine

## 2021-06-08 VITALS — BP 150/85 | HR 60 | Resp 16 | Wt 187.8 lb

## 2021-06-08 VITALS — BP 126/62 | HR 82 | Temp 98.5°F | Ht 61.0 in | Wt 188.4 lb

## 2021-06-08 DIAGNOSIS — Z1231 Encounter for screening mammogram for malignant neoplasm of breast: Secondary | ICD-10-CM

## 2021-06-08 DIAGNOSIS — L03115 Cellulitis of right lower limb: Secondary | ICD-10-CM | POA: Diagnosis not present

## 2021-06-08 DIAGNOSIS — I890 Lymphedema, not elsewhere classified: Secondary | ICD-10-CM

## 2021-06-08 DIAGNOSIS — T148XXA Other injury of unspecified body region, initial encounter: Secondary | ICD-10-CM | POA: Diagnosis not present

## 2021-06-08 MED ORDER — MUPIROCIN 2 % EX OINT: 1.0000 "application " | TOPICAL_OINTMENT | Freq: Two times a day (BID) | CUTANEOUS | 0 refills | Status: DC

## 2021-06-08 NOTE — Patient Instructions (Addendum)
Dove antibacterial bodywash   Cellulitis, Adult Cellulitis is a skin infection. The infected area is usually warm, red, swollen, and tender. This condition occurs most often in the arms and lower legs. The infection can travel to the muscles, blood, and underlying tissue and become serious. It is very important to get treated for this condition. What are the causes? Cellulitis is caused by bacteria. The bacteria enter through a break in the skin, such as a cut, burn, insect bite, open sore, or crack. What increases the risk? This condition is more likely to occur in people who: Have a weak body defense system (immune system). Have open wounds on the skin, such as cuts, burns, bites, and scrapes. Bacteria can enter the body through these open wounds. Are older than 83 years of age. Have diabetes. Have a type of long-lasting (chronic) liver disease (cirrhosis) or kidney disease. Are obese. Have a skin condition such as: Itchy rash (eczema). Slow movement of blood in the veins (venous stasis). Fluid buildup below the skin (edema). Have had radiation therapy. Use IV drugs. What are the signs or symptoms? Symptoms of this condition include: Redness, streaking, or spotting on the skin. Swollen area of the skin. Tenderness or pain when an area of the skin is touched. Warm skin. A fever. Chills. Blisters. How is this diagnosed? This condition is diagnosed based on a medical history and physical exam. You may also have tests, including: Blood tests. Imaging tests. How is this treated? Treatment for this condition may include: Medicines, such as antibiotic medicines or medicines to treat allergies (antihistamines). Supportive care, such as rest and application of cold or warm cloths (compresses) to the skin. Hospital care, if the condition is severe. The infection usually starts to get better within 1-2 days of treatment. Follow these instructions at home: Medicines Take  over-the-counter and prescription medicines only as told by your health care provider. If you were prescribed an antibiotic medicine, take it as told by your health care provider. Do not stop taking the antibiotic even if you start to feel better. General instructions Drink enough fluid to keep your urine pale yellow. Do not touch or rub the infected area. Raise (elevate) the infected area above the level of your heart while you are sitting or lying down. Apply warm or cold compresses to the affected area as told by your health care provider. Keep all follow-up visits as told by your health care provider. This is important. These visits let your health care provider make sure a more serious infection is not developing. Contact a health care provider if: You have a fever. Your symptoms do not begin to improve within 1-2 days of starting treatment. Your bone or joint underneath the infected area becomes painful after the skin has healed. Your infection returns in the same area or another area. You notice a swollen bump in the infected area. You develop new symptoms. You have a general ill feeling (malaise) with muscle aches and pains. Get help right away if: Your symptoms get worse. You feel very sleepy. You develop vomiting or diarrhea that persists. You notice red streaks coming from the infected area. Your red area gets larger or turns dark in color. These symptoms may represent a serious problem that is an emergency. Do not wait to see if the symptoms will go away. Get medical help right away. Call your local emergency services (911 in the [place] Do not drive yourself to the hospital. Summary Cellulitis is a skin infection. This  condition occurs most often in the arms and lower legs. Treatment for this condition may include medicines, such as antibiotic medicines or antihistamines. Take over-the-counter and prescription medicines only as told by your health care provider. If you were  prescribed an antibiotic medicine, do not stop taking the antibiotic even if you start to feel better. Contact a health care provider if your symptoms do not begin to improve within 1-2 days of starting treatment or your symptoms get worse. Keep all follow-up visits as told by your health care provider. This is important. These visits let your health care provider make sure that a more serious infection is not developing. This information is not intended to replace advice given to you by your health care provider. Make sure you discuss any questions you have with your health care provider. Document Revised: 04/28/2019 Document Reviewed: 09/06/2017 Elsevier Patient Education  [place].

## 2021-06-08 NOTE — Progress Notes (Signed)
Chief Complaint  Patient presents with   Leg Swelling   F/u  Chronic lymphedema and cellulitis now right lower leg with cracking of skin and top of foot was bleeding tried keflex 600 mg 2x per day she thinks lymphopress caused this and wears them at night Sunday open wounds started and seen [facility] 06/06/21  [facility]ed [facility] will go there now to unna boot her    Review of Systems  Constitutional:  Negative for weight loss.  HENT:  Negative for hearing loss.   Eyes:  Negative for blurred vision.  Respiratory:  Negative for shortness of breath.   Cardiovascular:  Negative for chest pain.  Gastrointestinal:  Negative for abdominal pain and blood in stool.  Genitourinary:  Negative for dysuria.  Musculoskeletal:  Negative for falls and joint pain.  Skin:  Negative for rash.  Neurological:  Negative for headaches.  Psychiatric/Behavioral:  Negative for depression.   Past Medical History:  Diagnosis Date   CAD S/P percutaneous coronary angioplasty    a. 07/2015 MV: No ischemia, EF 66%;  b. 03/2016 Inflat STEMI/PCI: LM nl, LAD 25d, RI nl, LCX nl, OM1/2 nl, RCA 80p (3.5x16 Promus Premier DES), 50p/m, 30d, RPDA 90 (2.5x12 Promu Premier DES); c. 01/2018 Cath ([place], [place]): Patent RCA stents->Med Rx.   CKD (chronic kidney disease), stage III ([place])    Diastolic dysfunction    a. 10/2013 Echo: EF 55-65%, Gr1 DD; b. 01/2018 Echo ([place], [place]): EF 55-60%, Gr2 DD, RVSP 72mmHg.   Diverticulitis    Dyspnea    Endometriosis    Family history of adverse reaction to anesthesia    Mother - had to stay in ICU because of breathing difficulties every time she had anesthesia   GERD (gastroesophageal reflux disease)    Hiatal hernia    History of colon polyps 08/1994   History of Migraines    Hypercholesterolemia    Hypertension    LBBB (left bundle branch block)    Osteoporosis    PAF (paroxysmal atrial fibrillation) ([place])    a. 03/2016 Dx @ time of MI; b. CHA2DS2VASc =  5-->Eliquis.   Rheumatic fever    Sleep apnea    CPAP   Spasmodic dysphonia    ST elevation myocardial infarction (STEMI) of inferolateral wall, initial episode of care [facility]) 03/25/2016   Urine incontinence    Past Surgical History:  Procedure Laterality Date   ABDOMINAL HYSTERECTOMY     ovaries not removed   APPENDECTOMY     was removed during hysterectomy   Breast biopsies     x2   BREAST EXCISIONAL BIOPSY Bilateral "years ago"   neg   BREAST SURGERY Bilateral    BROW LIFT Bilateral 08/15/2019   Procedure: BLEPHAROPLASTY UPPER EYELID; W/EXCESS SKIN BLEPHAROPTOSIS REPAIR; RESECT EX;  Surgeon: Karle Starch, MD;  Location: [place];  Service: Ophthalmology;  Laterality: Bilateral;  sleep apnea   CARDIAC CATHETERIZATION  2011   moderate 40% RCA disease   CARDIAC CATHETERIZATION  01/2010   Dr. Gollan@[facility] : Only noted 40% RCA   CARDIAC CATHETERIZATION N/A 03/25/2016   Procedure: Left Heart Cath and Coronary Angiography;  Surgeon: 04/07/2016, MD;  Location: [place] [facility];  Service: Cardiovascular;  Laterality: N/A;   CARDIAC CATHETERIZATION N/A 03/25/2016   Procedure: Coronary Stent Intervention;  Surgeon: 04/07/2016, MD;  Location: [place] [facility];  Service: Cardiovascular;  Laterality: N/A;   COLONOSCOPY  2013   ESOPHAGOGASTRODUODENOSCOPY (EGD) WITH  PROPOFOL N/A 03/17/2015   Procedure: ESOPHAGOGASTRODUODENOSCOPY (EGD) WITH PROPOFOL;  Surgeon: Christene Lye, MD;  Location: [facility]OS[facility];  Service: Gastroenterology;  Laterality: N/A;   NM MYOVIEW ([place] [facility])  07/2015   No evidence ischemia or infarction. EF 66%. Low risk   TONSILLECTOMY     TRANSTHORACIC ECHOCARDIOGRAM  10/2013   Normal LV size and function. EF 55-65%. GR 1 DD. Otherwise normal.   Family History  Problem Relation Age of Onset   Arthritis Mother    Stroke Mother    Hypertension Mother    Osteoporosis Mother    Heart disease Father        MI   Heart attack Father    Stroke  Brother    Heart attack Sister    Breast cancer Other        first cousin x 2   Stroke Daughter    Heart attack Daughter    Colon cancer Neg Hx    Social History   Socioeconomic History   Marital status: Widowed    Spouse name: Not on file   Number of children: 2   Years of education: Not on file   Highest education level: Not on file  Occupational History   Not on file  Tobacco Use   Smoking status: Never   Smokeless tobacco: Never  Vaping Use   Vaping Use: Never used  Substance and Sexual Activity   Alcohol use: No    Alcohol/week: 0.0 standard drinks   Drug use: No   Sexual activity: Not Currently    Birth control/protection: Post-menopausal  Other Topics Concern   Not on file  Social History Narrative   Not on file   Social Determinants of Health   Financial Resource Strain: Not on file  Food Insecurity: Not on file  Transportation Needs: Not on file  Physical Activity: Not on file  Stress: Not on file  Social Connections: Not on file  Intimate Partner Violence: Not on file   Current Meds  Medication Sig   acetaminophen (TYLENOL) 500 MG tablet Take 500 mg by mouth daily as needed for headache (pain).   albuterol (PROVENTIL HFA;VENTOLIN HFA) 108 (90 Base) MCG/ACT inhaler Inhale 2 puffs into the lungs every 6 (six) hours as needed for wheezing or shortness of breath.   amiodarone (PACERONE) 200 MG tablet TAKE 1 TABLET BY MOUTH DAILY   aspirin EC 81 MG tablet Take 1 tablet (81 mg total) by mouth daily.   Azelastine HCl 137 MCG/SPRAY SOLN USE 1 TO 2 SPRAYS IN EACH NOSTRIL TWICE A DAY AS NEEDED FOR DRAINAGE   cephALEXin (KEFLEX) 500 MG capsule Take 500 mg by mouth 2 (two) times daily.   cholecalciferol (VITAMIN D) 1000 units tablet Take 1,000 Units by mouth 2 (two) times daily.   ELIQUIS 5 MG TABS tablet TAKE 1 TABLET BY MOUTH TWICE A DAY   fish oil-omega-3 fatty acids 1000 MG capsule Take 1 g by mouth 2 (two) times daily.    fluticasone-salmeterol (ADVAIR HFA)  230-21 MCG/ACT inhaler Inhale 2 puffs into the lungs 2 (two) times daily.   furosemide (LASIX) 40 MG tablet Take 1 tablet (40 mg total) by mouth 2 (two) times daily. Dr. Rockey Situ advised alternate 40 mg twice a day with 40 mg daily every other day   hydrALAZINE (APRESOLINE) 10 MG tablet TAKE ONE TABLET BY MOUTH THREE TIMES A DAY   losartan (COZAAR) 100 MG tablet TAKE 1 TABLET BY MOUTH DAILY   Misc Natural Products (  OSTEO BI-FLEX JOINT SHIELD PO) Take 1 tablet by mouth 2 (two) times daily.    Multiple Vitamin (MULTIVITAMIN WITH MINERALS) TABS tablet Take 0.5 tablets by mouth 2 (two) times daily.   mupirocin ointment (BACTROBAN) 2 % Apply 1 application topically 2 (two) times daily.   nitroGLYCERIN (NITROSTAT) 0.4 MG SL tablet Place 1 tablet (0.4 mg total) under the tongue every 5 (five) minutes as needed.   pantoprazole (PROTONIX) 40 MG tablet TAKE 1 TABLET BY MOUTH DAILY   Polyethyl Glycol-Propyl Glycol (SYSTANE OP) Place 1 drop into both eyes daily as needed (dry eyes).   potassium chloride (KLOR-CON) 10 MEQ tablet TAKE 1 TABLET BY MOUTH TWICE A DAY   predniSONE (STERAPRED UNI-PAK 21 TAB) 5 MG (21) TBPK tablet Take by mouth as directed.   PRESCRIPTION MEDICATION Inhale into the lungs at bedtime. CPAP   propranolol (INDERAL) 20 MG tablet TAKE ONE TABLET BY MOUTH THREE TIMES A DAY   rosuvastatin (CRESTOR) 40 MG tablet TAKE 1 TABLET BY MOUTH DAILY   sucralfate (CARAFATE) 1 g tablet Take 1 g by mouth 2 (two) times daily.   traMADol (ULTRAM) 50 MG tablet Take 1 tablet (50 mg total) by mouth 2 (two) times daily as needed.   Allergies  Allergen Reactions   Penicillins Other (See Comments)    Okay to take amoxicillin/(pt does not recall what the reaction to penicillin was (51-11 years old)    Sulfa Antibiotics Rash   Recent Results (from the past 2160 hour(s))  TSH     Status: None   Collection Time: 04/06/21 12:27 PM  Result Value Ref Range   TSH 3.91 0.35 - 5.50 uIU/mL  Hepatic function panel      Status: Abnormal   Collection Time: 04/06/21 12:27 PM  Result Value Ref Range   Total Bilirubin 0.8 0.2 - 1.2 mg/dL   Bilirubin, Direct 0.2 0.0 - 0.3 mg/dL   Alkaline Phosphatase 107 39 - 117 U/L   AST 61 (H) 0 - 37 U/L   ALT 33 0 - 35 U/L   Total Protein 6.8 6.0 - 8.3 g/dL   Albumin 3.3 (L) 3.5 - 5.2 g/dL  Basic metabolic panel     Status: Abnormal   Collection Time: 04/06/21 12:27 PM  Result Value Ref Range   Sodium 139 135 - 145 mEq/L   Potassium 3.8 3.5 - 5.1 mEq/L   Chloride 105 96 - 112 mEq/L   CO2 29 19 - 32 mEq/L   Glucose, Bld 98 70 - 99 mg/dL   BUN 20 6 - 23 mg/dL   Creatinine, Ser 1.19 0.40 - 1.20 mg/dL   GFR 42.60 (L) >60.00 mL/min    Comment: Calculated using the CKD-EPI Creatinine Equation (2021)   Calcium 8.9 8.4 - 10.5 mg/dL   Objective  Body mass index is 35.6 kg/m. Wt Readings from Last 3 Encounters:  06/08/21 188 lb 6.4 oz (85.5 kg)  04/10/21 192 lb (87.1 kg)  03/28/21 195 lb 2 oz (88.5 kg)   Temp Readings from Last 3 Encounters:  06/08/21 98.5 F (36.9 C) (Oral)  04/10/21 98.5 F (36.9 C)  12/30/20 97.8 F (36.6 C)   BP Readings from Last 3 Encounters:  06/08/21 126/62  04/10/21 138/72  03/28/21 (!) 140/58   Pulse Readings from Last 3 Encounters:  06/08/21 82  04/10/21 60  03/28/21 (!) 55    Physical Exam Vitals and nursing note reviewed.  Constitutional:      Appearance: Normal appearance. She is  well-developed and well-groomed.  HENT:     Head: Normocephalic and atraumatic.  Eyes:     Conjunctiva/sclera: Conjunctivae normal.     Pupils: Pupils are equal, round, and reactive to light.  Cardiovascular:     Rate and Rhythm: Normal rate and regular rhythm.     Heart sounds: Normal heart sounds. No murmur heard. Pulmonary:     Effort: Pulmonary effort is normal.     Breath sounds: Normal breath sounds.  Abdominal:     General: Abdomen is flat. Bowel sounds are normal.     Tenderness: There is no abdominal tenderness.   Musculoskeletal:        General: No tenderness.  Skin:    General: Skin is warm and dry.     Comments: Chronic lymphedema R>L and right leg cellulitis    Neurological:     General: No focal deficit present.     Mental Status: She is alert and oriented to person, place, and time. Mental status is at baseline.     Cranial Nerves: Cranial nerves 2-12 are intact.     Gait: Gait is intact.  Psychiatric:        Attention and Perception: Attention and perception normal.        Mood and Affect: Mood and affect normal.        Speech: Speech normal.        Behavior: Behavior normal. Behavior is cooperative.        Thought Content: Thought content normal.        Cognition and Memory: Cognition and memory normal.        Judgment: Judgment normal.    Assessment  Plan  Lymphedema - Plan: Ambulatory referral to Vascular Surgery  Open wound - Plan: mupirocin ointment (BACTROBAN) 2 %  Cellulitis of right lower extremity  Will go today [facility] and do unna b/l right leg or b/l   Has lymphopress at home not sure if sizing needs to be changed or settings amt time spent doing this defer to vascular  Provider: Dr. Olivia Mackie Mc[facility]-Internal Medicine

## 2021-06-08 NOTE — Telephone Encounter (Signed)
Appears [facility] is planning to work her in. Patient did not want me to call for her. Wants to re-establish somewhere else. She is on keflex and is still having some oozing from her legs. She did say they have improved some but also requested unna boots.advised that we cannot do that in our office. Pt is coming in to see Dr Olivia Mackie so she can look at her legs.

## 2021-06-08 NOTE — Telephone Encounter (Signed)
The patient was seen at the urgent care([facility]) on 06/06/21 for Bil oozing of her feet the drainage contained blood. She also had swelling and redness of her RLE an swelling of her LLE she was given cephalexin 500 Mg 2 times a day and told to follow up at [facility]. The patient stated that it would be next week before she could be seen. She is asking for Unna wraps.

## 2021-06-08 NOTE — Progress Notes (Signed)
History of Present Illness  There is no documented history at this time  Assessments & Plan   There are no diagnoses linked to this encounter.    Additional instructions  Subjective:  Patient presents with venous ulcer of the Bilateral lower extremity.    Procedure:  3 layer unna wrap was placed Bilateral lower extremity.   Plan:   Follow up in one week.  

## 2021-06-11 ENCOUNTER — Encounter (INDEPENDENT_AMBULATORY_CARE_PROVIDER_SITE_OTHER): Payer: Self-pay | Admitting: Nurse Practitioner

## 2021-06-16 ENCOUNTER — Other Ambulatory Visit: Payer: Self-pay

## 2021-06-16 ENCOUNTER — Ambulatory Visit (INDEPENDENT_AMBULATORY_CARE_PROVIDER_SITE_OTHER): Payer: Medicare Other | Admitting: Nurse Practitioner

## 2021-06-16 ENCOUNTER — Encounter (INDEPENDENT_AMBULATORY_CARE_PROVIDER_SITE_OTHER): Payer: Self-pay

## 2021-06-16 VITALS — BP 98/63 | HR 60 | Resp 16

## 2021-06-16 DIAGNOSIS — I890 Lymphedema, not elsewhere classified: Secondary | ICD-10-CM | POA: Diagnosis not present

## 2021-06-16 NOTE — Progress Notes (Signed)
History of Present Illness  There is no documented history at this time  Assessments & Plan   There are no diagnoses linked to this encounter.    Additional instructions  Subjective:  Patient presents with venous ulcer of the Bilateral lower extremity.    Procedure:  3 layer unna wrap was placed Bilateral lower extremity.   Plan:   Follow up in one week.  

## 2021-06-19 ENCOUNTER — Encounter (INDEPENDENT_AMBULATORY_CARE_PROVIDER_SITE_OTHER): Payer: Self-pay | Admitting: Nurse Practitioner

## 2021-06-21 ENCOUNTER — Encounter (INDEPENDENT_AMBULATORY_CARE_PROVIDER_SITE_OTHER): Payer: Self-pay

## 2021-06-21 ENCOUNTER — Ambulatory Visit (INDEPENDENT_AMBULATORY_CARE_PROVIDER_SITE_OTHER): Payer: Medicare Other | Admitting: Nurse Practitioner

## 2021-06-22 ENCOUNTER — Encounter (INDEPENDENT_AMBULATORY_CARE_PROVIDER_SITE_OTHER): Payer: Self-pay

## 2021-06-22 ENCOUNTER — Ambulatory Visit (INDEPENDENT_AMBULATORY_CARE_PROVIDER_SITE_OTHER): Payer: Medicare Other | Admitting: Nurse Practitioner

## 2021-06-22 ENCOUNTER — Other Ambulatory Visit: Payer: Self-pay

## 2021-06-22 VITALS — BP 105/64 | HR 64 | Resp 15 | Wt 190.6 lb

## 2021-06-22 DIAGNOSIS — I890 Lymphedema, not elsewhere classified: Secondary | ICD-10-CM

## 2021-06-22 NOTE — Progress Notes (Signed)
History of Present Illness  There is no documented history at this time  Assessments & Plan   There are no diagnoses linked to this encounter.    Additional instructions  Subjective:  Patient presents with venous ulcer of the Bilateral lower extremity.    Procedure:  3 layer unna wrap was placed Bilateral lower extremity.   Plan:   Follow up in one week.  

## 2021-06-26 ENCOUNTER — Encounter (INDEPENDENT_AMBULATORY_CARE_PROVIDER_SITE_OTHER): Payer: Self-pay | Admitting: Nurse Practitioner

## 2021-06-27 DIAGNOSIS — Z20822 Contact with and (suspected) exposure to covid-19: Secondary | ICD-10-CM | POA: Diagnosis not present

## 2021-06-29 ENCOUNTER — Encounter (INDEPENDENT_AMBULATORY_CARE_PROVIDER_SITE_OTHER): Payer: Medicare Other

## 2021-06-30 ENCOUNTER — Ambulatory Visit (INDEPENDENT_AMBULATORY_CARE_PROVIDER_SITE_OTHER): Payer: Medicare Other | Admitting: Nurse Practitioner

## 2021-06-30 ENCOUNTER — Other Ambulatory Visit: Payer: Self-pay | Admitting: Cardiovascular Disease

## 2021-06-30 ENCOUNTER — Encounter (INDEPENDENT_AMBULATORY_CARE_PROVIDER_SITE_OTHER): Payer: Self-pay | Admitting: Nurse Practitioner

## 2021-06-30 ENCOUNTER — Other Ambulatory Visit: Payer: Self-pay

## 2021-06-30 VITALS — BP 170/78 | HR 73 | Resp 16 | Wt 194.0 lb

## 2021-06-30 DIAGNOSIS — I890 Lymphedema, not elsewhere classified: Secondary | ICD-10-CM

## 2021-06-30 NOTE — Progress Notes (Signed)
History of Present Illness  There is no documented history at this time  Assessments & Plan   There are no diagnoses linked to this encounter.    Additional instructions  Subjective:  Patient presents with venous ulcer of the Bilateral lower extremity.    Procedure:  3 layer unna wrap was placed Bilateral lower extremity.   Plan:   Follow up in one week.  

## 2021-07-02 IMAGING — DX DG KNEE COMPLETE 4+V*R*
4 series · 4 of 4 positions shown · non-contrast
Comparison: None.

CLINICAL DATA: Posterior right knee pain and right leg weakness.

EXAM:
RIGHT KNEE - COMPLETE 4+ VIEW

[knee ap]
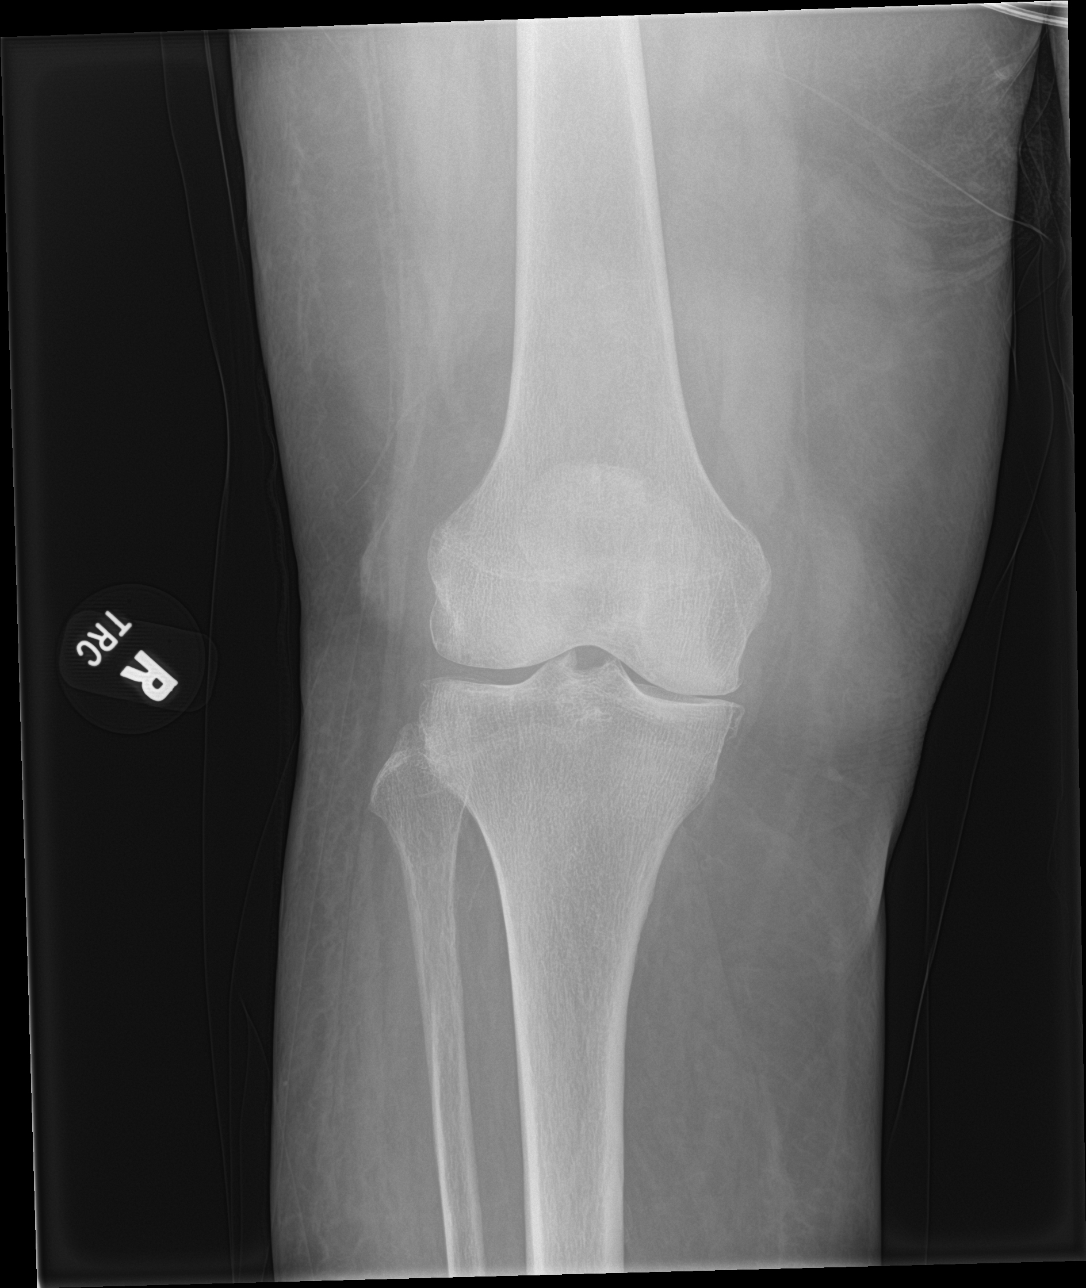

[knee obl (1 of 2)]
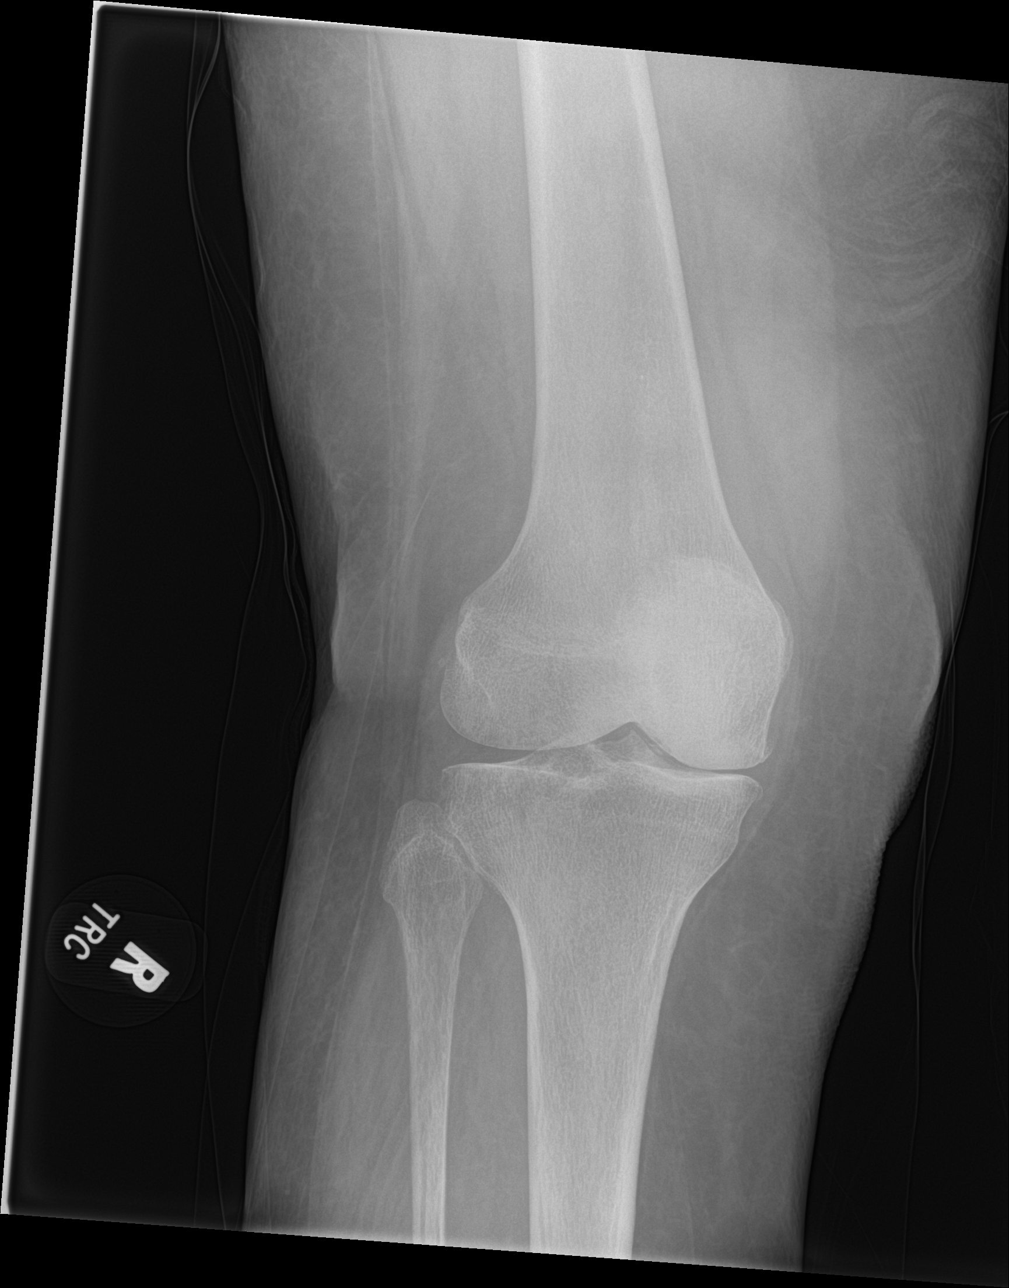

[knee obl (2 of 2)]
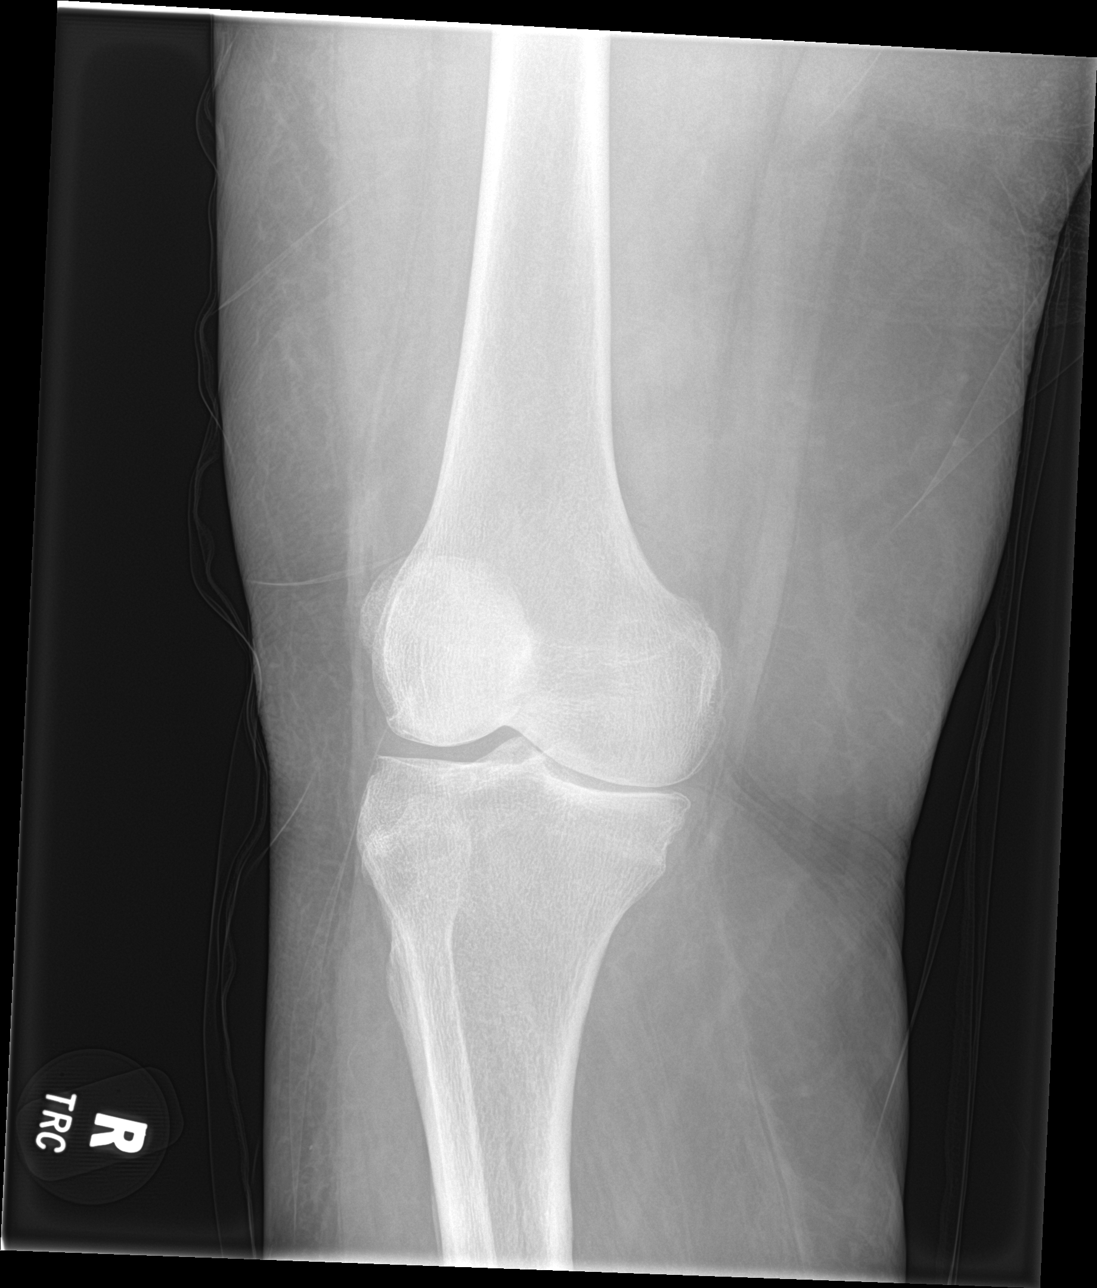

[knee lat]
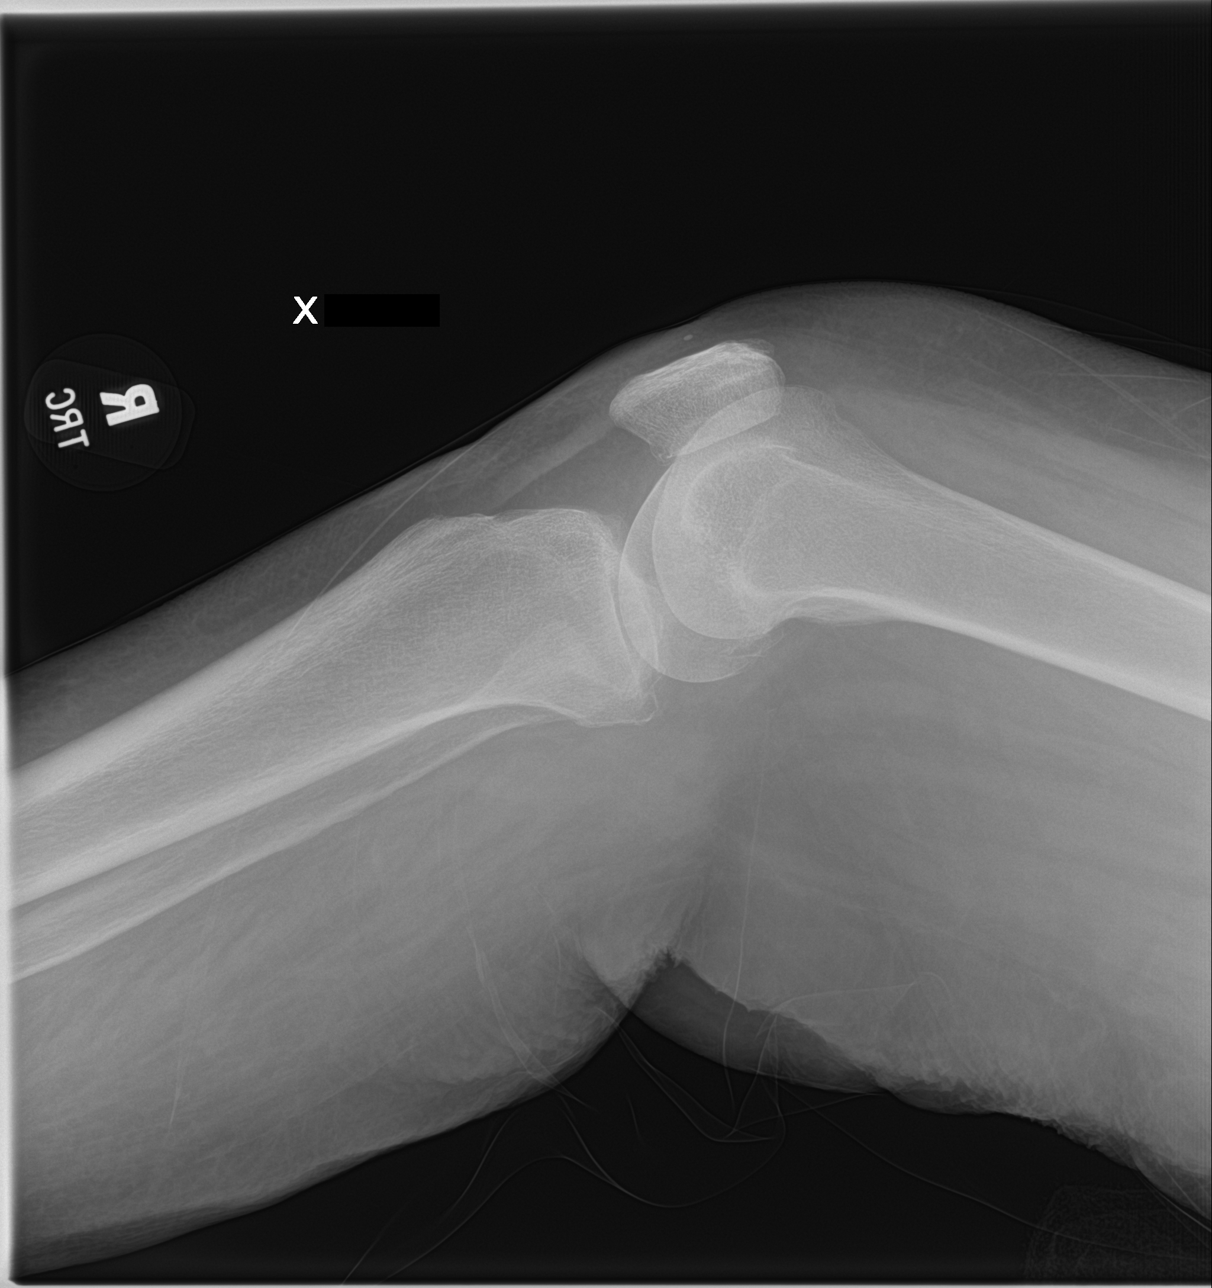

[4 of 4 positions shown; findings below may reference images not displayed]

FINDINGS: No evidence of an acute fracture, dislocation, or joint effusion.
There is marked severity medial tibiofemoral compartment space
narrowing. Soft tissues are unremarkable.
IMPRESSION: Degenerative changes involving the medial aspect of the right knee,
without an acute osseous abnormality.

## 2021-07-04 ENCOUNTER — Encounter (INDEPENDENT_AMBULATORY_CARE_PROVIDER_SITE_OTHER): Payer: Self-pay | Admitting: Nurse Practitioner

## 2021-07-05 ENCOUNTER — Other Ambulatory Visit: Payer: Self-pay

## 2021-07-05 ENCOUNTER — Ambulatory Visit (INDEPENDENT_AMBULATORY_CARE_PROVIDER_SITE_OTHER): Payer: Medicare Other | Admitting: Vascular Surgery

## 2021-07-05 ENCOUNTER — Encounter (INDEPENDENT_AMBULATORY_CARE_PROVIDER_SITE_OTHER): Payer: Medicare Other

## 2021-07-05 ENCOUNTER — Encounter (INDEPENDENT_AMBULATORY_CARE_PROVIDER_SITE_OTHER): Payer: Self-pay | Admitting: Vascular Surgery

## 2021-07-05 VITALS — BP 94/59 | HR 64 | Resp 18 | Ht 59.0 in | Wt 196.0 lb

## 2021-07-05 DIAGNOSIS — I10 Essential (primary) hypertension: Secondary | ICD-10-CM | POA: Diagnosis not present

## 2021-07-05 DIAGNOSIS — N183 Chronic kidney disease, stage 3 unspecified: Secondary | ICD-10-CM | POA: Diagnosis not present

## 2021-07-05 DIAGNOSIS — E1159 Type 2 diabetes mellitus with other circulatory complications: Secondary | ICD-10-CM

## 2021-07-05 DIAGNOSIS — E7800 Pure hypercholesterolemia, unspecified: Secondary | ICD-10-CM

## 2021-07-05 DIAGNOSIS — I480 Paroxysmal atrial fibrillation: Secondary | ICD-10-CM | POA: Diagnosis not present

## 2021-07-05 DIAGNOSIS — I890 Lymphedema, not elsewhere classified: Secondary | ICD-10-CM

## 2021-07-05 NOTE — Progress Notes (Signed)
MRN : [id number]  April [name] is a 81 y.o. ([birth year]) female who presents with chief complaint of  Chief Complaint  Patient presents with   Follow-up    lymphadema  .  History of Present Illness: Patient returns today in follow up of her lymphedema.  She says the [employer] staying in burn and she does not want to wear them anymore.  She asked about coming out although her swelling is still quite pronounced.  She does not wear compression socks.  She says she can get on the zippered compression socks either.  She does try to elevate her legs.  Current Outpatient Medications  Medication Sig Dispense Refill   acetaminophen (TYLENOL) 500 MG tablet Take 500 mg by mouth daily as needed for headache (pain).     albuterol (PROVENTIL HFA;VENTOLIN HFA) 108 (90 Base) MCG/ACT inhaler Inhale 2 puffs into the lungs every 6 (six) hours as needed for wheezing or shortness of breath. 1 Inhaler 6   amiodarone (PACERONE) 200 MG tablet TAKE 1 TABLET BY MOUTH DAILY 90 tablet 0   aspirin EC 81 MG tablet Take 1 tablet (81 mg total) by mouth daily. 90 tablet 3   Azelastine HCl 137 MCG/SPRAY SOLN USE 1 TO 2 SPRAYS IN EACH NOSTRIL TWICE A DAY AS NEEDED FOR DRAINAGE 90 mL 0   cephALEXin (KEFLEX) 500 MG capsule Take 500 mg by mouth 2 (two) times daily.     cholecalciferol (VITAMIN D) 1000 units tablet Take 1,000 Units by mouth 2 (two) times daily.     ELIQUIS 5 MG TABS tablet TAKE 1 TABLET BY MOUTH TWICE A DAY 180 tablet 1   fish oil-omega-3 fatty acids 1000 MG capsule Take 1 g by mouth 2 (two) times daily.      fluticasone-salmeterol (ADVAIR HFA) 230-21 MCG/ACT inhaler Inhale 2 puffs into the lungs 2 (two) times daily. 1 each 12   furosemide (LASIX) 40 MG tablet Take 1 tablet (40 mg total) by mouth 2 (two) times daily. Dr. Rockey Situ advised alternate 40 mg twice a day with 40 mg daily every other day 180 tablet 3   hydrALAZINE (APRESOLINE) 10 MG tablet TAKE ONE TABLET BY MOUTH THREE TIMES A DAY 270 tablet 1    losartan (COZAAR) 100 MG tablet TAKE 1 TABLET BY MOUTH DAILY 90 tablet 1   Misc Natural Products (OSTEO BI-FLEX JOINT SHIELD PO) Take 1 tablet by mouth 2 (two) times daily.      Multiple Vitamin (MULTIVITAMIN WITH MINERALS) TABS tablet Take 0.5 tablets by mouth 2 (two) times daily.     mupirocin ointment (BACTROBAN) 2 % Apply 1 application topically 2 (two) times daily. 60 g 0   nitroGLYCERIN (NITROSTAT) 0.4 MG SL tablet Place 1 tablet (0.4 mg total) under the tongue every 5 (five) minutes as needed. 25 tablet 0   pantoprazole (PROTONIX) 40 MG tablet TAKE 1 TABLET BY MOUTH DAILY 30 tablet 5   Polyethyl Glycol-Propyl Glycol (SYSTANE OP) Place 1 drop into both eyes daily as needed (dry eyes).     potassium chloride (KLOR-CON) 10 MEQ tablet TAKE 1 TABLET BY MOUTH TWICE A DAY 180 tablet 0   PRESCRIPTION MEDICATION Inhale into the lungs at bedtime. CPAP     propranolol (INDERAL) 20 MG tablet TAKE ONE TABLET BY MOUTH THREE TIMES A DAY 270 tablet 0   rosuvastatin (CRESTOR) 40 MG tablet TAKE 1 TABLET BY MOUTH DAILY 90 tablet 0   sucralfate (CARAFATE) 1 g tablet Take 1 g  by mouth 2 (two) times daily.     traMADol (ULTRAM) 50 MG tablet Take 1 tablet (50 mg total) by mouth 2 (two) times daily as needed. 10 tablet 0   No current facility-administered medications for this visit.    Past Medical History:  Diagnosis Date   CAD S/P percutaneous coronary angioplasty    a. 07/2015 MV: No ischemia, EF 66%;  b. 03/2016 Inflat STEMI/PCI: LM nl, LAD 25d, RI nl, LCX nl, OM1/2 nl, RCA 80p (3.5x16 Promus Premier DES), 50p/m, 30d, RPDA 90 (2.5x12 Promu Premier DES); c. 01/2018 Cath ([place], [place]): Patent RCA stents->Med Rx.   CKD (chronic kidney disease), stage III ([place])    Diastolic dysfunction    a. 10/2013 Echo: EF 55-65%, Gr1 DD; b. 01/2018 Echo ([place], [place]): EF 55-60%, Gr2 DD, RVSP 4mHg.   Diverticulitis    Dyspnea    Endometriosis    Family history of adverse reaction to  anesthesia    Mother - had to stay in ICU because of breathing difficulties every time she had anesthesia   GERD (gastroesophageal reflux disease)    Hiatal hernia    History of colon polyps 08/1994   History of Migraines    Hypercholesterolemia    Hypertension    LBBB (left bundle branch block)    Osteoporosis    PAF (paroxysmal atrial fibrillation) ([place]    a. 03/2016 Dx @ time of MI; b. CHA2DS2VASc = 5-->Eliquis.   Rheumatic fever    Sleep apnea    CPAP   Spasmodic dysphonia    ST elevation myocardial infarction (STEMI) of inferolateral wall, initial episode of care ([facility] 03/25/2016   Urine incontinence     Past Surgical History:  Procedure Laterality Date   ABDOMINAL HYSTERECTOMY     ovaries not removed   APPENDECTOMY     was removed during hysterectomy   Breast biopsies     x2   BREAST EXCISIONAL BIOPSY Bilateral "years ago"   neg   BREAST SURGERY Bilateral    BROW LIFT Bilateral 08/15/2019   Procedure: BLEPHAROPLASTY UPPER EYELID; W/EXCESS SKIN BLEPHAROPTOSIS REPAIR; RESECT EX;  Surgeon: FKarle Starch MD;  [place]  Service: Ophthalmology;  Laterality: Bilateral;  sleep apnea   CARDIAC CATHETERIZATION  2011   moderate 40% RCA disease   CARDIAC CATHETERIZATION  01/2010   Dr. [facility]ollan'@[facility]'$ : Only noted 40% RCA   CARDIAC CATHETERIZATION N/A 03/25/2016   Procedure: Left Heart Cath and Coronary Angiography;  Surgeon: D12/11/2015 MD;  [facility];  Service: Cardiovascular;  Laterality: N/A;   CARDIAC CATHETERIZATION N/A 03/25/2016   Procedure: Coronary Stent Intervention;  Surgeon: D12/11/2015 MD;  [facility];  Service: Cardiovascular;  Laterality: N/A;   COLONOSCOPY  2013   ESOPHAGOGASTRODUODENOSCOPY (EGD) WITH PROPOFOL N/A 03/17/2015   Procedure: ESOPHAGOGASTRODUODENOSCOPY (EGD) WITH PROPOFOL;  Surgeon: S11/29/2016 MD;  Location: [facility];  Service: Gastroenterology;  Laterality: N/A;   NM  [place] ([place])  07/2015   No evidence ischemia or infarction. EF 66%. Low risk   TONSILLECTOMY     TRANSTHORACIC ECHOCARDIOGRAM  10/2013   Normal LV size and function. EF 55-65%. GR 1 DD. Otherwise normal.     Social History   Tobacco Use   Smoking status: Never   Smokeless tobacco: Never  Vaping Use   Vaping Use: Never used  Substance Use Topics   Alcohol use: No    Alcohol/week:  0.0 standard drinks   Drug use: No      Family History  Problem Relation Age of Onset   Arthritis Mother    Stroke Mother    Hypertension Mother    Osteoporosis Mother    Heart disease Father        MI   Heart attack Father    Stroke Brother    Heart attack Sister    Breast cancer Other        first cousin x 2   Stroke Daughter    Heart attack Daughter    Colon cancer Neg Hx      Allergies  Allergen Reactions   Penicillins Other (See Comments)    Okay to take amoxicillin/(pt does not recall what the reaction to penicillin was (53-65 years old)    Sulfa Antibiotics Rash    REVIEW OF SYSTEMS (Negative unless checked)   Constitutional: '[]'$ Weight loss  '[]'$ Fever  '[]'$ Chills Cardiac: '[]'$ Chest pain   '[]'$ Chest pressure   '[x]'$ Palpitations   '[]'$ Shortness of breath when laying flat   '[]'$ Shortness of breath at rest   '[x]'$ Shortness of breath with exertion. Vascular:  '[x]'$ Pain in legs with walking   '[]'$ Pain in legs at rest   '[]'$ Pain in legs when laying flat   '[]'$ Claudication   '[]'$ Pain in feet when walking  '[]'$ Pain in feet at rest  '[]'$ Pain in feet when laying flat   '[]'$ History of DVT   '[]'$ Phlebitis   '[x]'$ Swelling in legs   '[x]'$ Varicose veins   '[]'$ Non-healing ulcers Pulmonary:   '[]'$ Uses home oxygen   '[]'$ Productive cough   '[]'$ Hemoptysis   '[]'$ Wheeze  '[]'$ COPD   '[]'$ Asthma Neurologic:  '[]'$ Dizziness  '[]'$ Blackouts   '[]'$ Seizures   '[]'$ History of stroke   '[]'$ History of TIA  '[]'$ Aphasia   '[]'$ Temporary blindness   '[]'$ Dysphagia   '[]'$ Weakness or numbness in arms   '[]'$ Weakness or numbness in legs Musculoskeletal:  '[x]'$ Arthritis   '[]'$ Joint swelling    '[x]'$ Joint pain   '[]'$ Low back pain Hematologic:  '[]'$ Easy bruising  '[]'$ Easy bleeding   '[]'$ Hypercoagulable state   '[]'$ Anemic  '[]'$ Hepatitis Gastrointestinal:  '[]'$ Blood in stool   '[]'$ Vomiting blood  '[x]'$ Gastroesophageal reflux/heartburn   '[]'$ Abdominal pain Genitourinary:  '[]'$ Chronic kidney disease   '[]'$ Difficult urination  '[]'$ Frequent urination  '[]'$ Burning with urination   '[]'$ Hematuria Skin:  '[]'$ Rashes   '[]'$ Ulcers   '[]'$ Wounds Psychological:  '[]'$ History of anxiety   '[]'$  History of major depression.  Physical Examination  BP (!) 94/59 (BP Location: Left Arm)    Pulse 64    Resp 18    Ht '4\' 11"'$  (1.499 m)    Wt 196 lb (88.9 kg)    LMP 05/01/1972    BMI 39.59 kg/m  Gen:  WD/WN, NAD Head: [place]/AT, No temporalis wasting. Ear/Nose/Throat: Hearing grossly intact, nares w/o erythema or drainage Eyes: Conjunctiva clear. Sclera non-icteric Neck: Supple.  Trachea midline Pulmonary:  Good air movement, no use of accessory muscles.  Cardiac: RRR, no JVD Vascular:  Vessel Right Left  Radial Palpable Palpable       Musculoskeletal: M/S 5/5 throughout.  No deformity or atrophy.  1-2+ right lower extremity edema, 2-3+ left lower extremity edema. Neurologic: Sensation grossly intact in extremities.  Symmetrical.  Speech is fluent.  Psychiatric: Judgment intact, Mood & affect appropriate for pt's clinical situation. Dermatologic: No rashes or ulcers noted.  No cellulitis or open wounds.      Labs No results found for this or any previous visit (from the past 2160 hour(s)).  Radiology No results found.  Assessment/Plan Paroxysmal atrial fibrillation (HCC) On anticoagulation and rate controlled. Cardiac issues can worsen lower extremity edema.   Essential hypertension, benign blood pressure control important in reducing the progression of atherosclerotic disease. On appropriate oral medications.     Diabetes mellitus ([place]) blood glucose control important in reducing the progression of atherosclerotic disease. Also,  involved in wound healing. On appropriate medications.     Hypercholesterolemia lipid control important in reducing the progression of atherosclerotic disease. Continue statin therapy  CKD (chronic kidney disease), stage III ([place]) Can worsen LE swelling  Lymphedema The patient is asking to come out of the Unna boots.  She does not wear compression socks reviewed and zippered compression socks.  She can try Ace wrap's at her facility these will need to be placed every day or 2.  We will see her back in about a month.  If she begins weeping she will need to go back into Unna boots.    Leotis Pain, MD  07/05/2021 3:23 PM    This note was created with Dragon medical transcription system.  Any errors from dictation are purely unintentional

## 2021-07-05 NOTE — Assessment & Plan Note (Signed)
Can worsen LE swelling 

## 2021-07-05 NOTE — Assessment & Plan Note (Signed)
The patient is asking to come out of the Unna boots.  She does not wear compression socks reviewed and zippered compression socks.  She can try Ace wrap's at her facility these will need to be placed every day or 2.  We will see her back in about a month.  If she begins weeping she will need to go back into Unna boots. ?

## 2021-07-06 ENCOUNTER — Ambulatory Visit (INDEPENDENT_AMBULATORY_CARE_PROVIDER_SITE_OTHER): Payer: Medicare Other | Admitting: Internal Medicine

## 2021-07-06 ENCOUNTER — Encounter (INDEPENDENT_AMBULATORY_CARE_PROVIDER_SITE_OTHER): Payer: Medicare Other

## 2021-07-06 ENCOUNTER — Ambulatory Visit (INDEPENDENT_AMBULATORY_CARE_PROVIDER_SITE_OTHER): Payer: Medicare Other

## 2021-07-06 ENCOUNTER — Other Ambulatory Visit: Payer: Self-pay

## 2021-07-06 ENCOUNTER — Ambulatory Visit (INDEPENDENT_AMBULATORY_CARE_PROVIDER_SITE_OTHER): Payer: Medicare Other | Admitting: Nurse Practitioner

## 2021-07-06 ENCOUNTER — Encounter: Payer: Self-pay | Admitting: Internal Medicine

## 2021-07-06 VITALS — BP 130/78 | HR 68 | Temp 97.5°F | Ht 60.0 in | Wt 195.4 lb

## 2021-07-06 DIAGNOSIS — I480 Paroxysmal atrial fibrillation: Secondary | ICD-10-CM | POA: Diagnosis not present

## 2021-07-06 DIAGNOSIS — R0602 Shortness of breath: Secondary | ICD-10-CM

## 2021-07-06 DIAGNOSIS — G4733 Obstructive sleep apnea (adult) (pediatric): Secondary | ICD-10-CM | POA: Diagnosis not present

## 2021-07-06 DIAGNOSIS — K219 Gastro-esophageal reflux disease without esophagitis: Secondary | ICD-10-CM | POA: Diagnosis not present

## 2021-07-06 DIAGNOSIS — K449 Diaphragmatic hernia without obstruction or gangrene: Secondary | ICD-10-CM | POA: Diagnosis not present

## 2021-07-06 DIAGNOSIS — N183 Chronic kidney disease, stage 3 unspecified: Secondary | ICD-10-CM

## 2021-07-06 DIAGNOSIS — I890 Lymphedema, not elsewhere classified: Secondary | ICD-10-CM

## 2021-07-06 DIAGNOSIS — E7800 Pure hypercholesterolemia, unspecified: Secondary | ICD-10-CM | POA: Diagnosis not present

## 2021-07-06 DIAGNOSIS — I25110 Atherosclerotic heart disease of native coronary artery with unstable angina pectoris: Secondary | ICD-10-CM

## 2021-07-06 DIAGNOSIS — R0609 Other forms of dyspnea: Secondary | ICD-10-CM | POA: Diagnosis not present

## 2021-07-06 DIAGNOSIS — I10 Essential (primary) hypertension: Secondary | ICD-10-CM

## 2021-07-06 DIAGNOSIS — I700 Atherosclerosis of aorta: Secondary | ICD-10-CM

## 2021-07-06 DIAGNOSIS — D6859 Other primary thrombophilia: Secondary | ICD-10-CM

## 2021-07-06 DIAGNOSIS — I5032 Chronic diastolic (congestive) heart failure: Secondary | ICD-10-CM | POA: Diagnosis not present

## 2021-07-06 DIAGNOSIS — E1159 Type 2 diabetes mellitus with other circulatory complications: Secondary | ICD-10-CM

## 2021-07-06 NOTE — Progress Notes (Signed)
Patient ID: April [name], female   DOB: [birth year], 81 y.o.   MRN: [id number]   Subjective:    Patient ID: April [name], female    DOB: 07-Mar-1941, 81 y.o.   MRN: [id number]  This visit occurred during the SARS-CoV-2 public health emergency.  Safety protocols were in place, including screening questions prior to the visit, additional usage of staff PPE, and extensive cleaning of exam room while observing appropriate contact time as indicated for disinfecting solutions.   Patient here for work in appt.   Chief Complaint  Patient presents with   Follow-up    Right led inflammation   .   HPI Work in - per note - lower extremity swelling.  Evaluated yesterday - Dr Lucky Cowboy - lymphedema.  Has been wearing unna boots.  Unable to wear compression hose.  Desires to take a break from unna boots.  Recommended ACE wraps. No increased redness.  Does have increased swelling.  Has also noticed some increased sob.  She had chronic sob, but reports she feels is gradually worsening.  States baseline weight at home is around 190 pounds.  Reports weight last night was 195-196 pounds.  Took extra lasix last night.  Has been on a regimen of taking one alternating with two qod.  She reports no acid reflux and is not on any medication for acid.  Has noticed black stools recently.  No diarrhea. Has a large hiatal hernia.  Saw GI 04/13/21.  Was taken off PPI for h.pylori test.  Negative.  Never restarted.  Stopped pepcid.  No abdominal pain.  Does state blood pressures have been running lower.  Eating.  Request referral to Dr Laural Roes for her breathing.    Past Medical History:  Diagnosis Date   CAD S/P percutaneous coronary angioplasty    a. 07/2015 MV: No ischemia, EF 66%;  b. 03/2016 Inflat STEMI/PCI: LM nl, LAD 25d, RI nl, LCX nl, OM1/2 nl, RCA 80p (3.5x16 Promus Premier DES), 50p/m, 30d, RPDA 90 (2.5x12 Promu Premier DES); c. 01/2018 Cath ([place], [place]): Patent RCA stents->Med Rx.   CKD (chronic  kidney disease), stage III ([place])    Diastolic dysfunction    a. 10/2013 Echo: EF 55-65%, Gr1 DD; b. 01/2018 Echo ([place], [place]): EF 55-60%, Gr2 DD, RVSP 36mHg.   Diverticulitis    Dyspnea    Endometriosis    Family history of adverse reaction to anesthesia    Mother - had to stay in ICU because of breathing difficulties every time she had anesthesia   GERD (gastroesophageal reflux disease)    Hiatal hernia    History of colon polyps 08/1994   History of Migraines    Hypercholesterolemia    Hypertension    LBBB (left bundle branch block)    Osteoporosis    PAF (paroxysmal atrial fibrillation) ([place]    a. 03/2016 Dx @ time of MI; b. CHA2DS2VASc = 5-->Eliquis.   Rheumatic fever    Sleep apnea    CPAP   Spasmodic dysphonia    ST elevation myocardial infarction (STEMI) of inferolateral wall, initial episode of care ([facility] 03/25/2016   Urine incontinence    Past Surgical History:  Procedure Laterality Date   ABDOMINAL HYSTERECTOMY     ovaries not removed   APPENDECTOMY     was removed during hysterectomy   Breast biopsies     x2   BREAST EXCISIONAL BIOPSY Bilateral "years ago"   neg   BREAST SURGERY Bilateral  BROW LIFT Bilateral 08/15/2019   Procedure: BLEPHAROPLASTY UPPER EYELID; W/EXCESS SKIN BLEPHAROPTOSIS REPAIR; RESECT EX;  Surgeon: Karle Starch, MD;  Location: [place];  Service: Ophthalmology;  Laterality: Bilateral;  sleep apnea   CARDIAC CATHETERIZATION  2011   moderate 40% RCA disease   CARDIAC CATHETERIZATION  01/2010   Dr. Gollan@[facility] : Only noted 40% RCA   CARDIAC CATHETERIZATION N/A 03/25/2016   Procedure: Left Heart Cath and Coronary Angiography;  Surgeon: 04/07/2016, MD;  Location: [place] [facility];  Service: Cardiovascular;  Laterality: N/A;   CARDIAC CATHETERIZATION N/A 03/25/2016   Procedure: Coronary Stent Intervention;  Surgeon: 04/07/2016, MD;  Location: [place] [facility];  Service: Cardiovascular;  Laterality: N/A;    COLONOSCOPY  2013   ESOPHAGOGASTRODUODENOSCOPY (EGD) WITH PROPOFOL N/A 03/17/2015   Procedure: ESOPHAGOGASTRODUODENOSCOPY (EGD) WITH PROPOFOL;  Surgeon: 03/30/2015, MD;  Location: [facility] ENDOSCOPY;  Service: Gastroenterology;  Laterality: N/A;   NM MYOVIEW ([place] [facility])  07/2015   No evidence ischemia or infarction. EF 66%. Low risk   TONSILLECTOMY     TRANSTHORACIC ECHOCARDIOGRAM  10/2013   Normal LV size and function. EF 55-65%. GR 1 DD. Otherwise normal.   Family History  Problem Relation Age of Onset   Arthritis Mother    Stroke Mother    Hypertension Mother    Osteoporosis Mother    Heart disease Father        MI   Heart attack Father    Stroke Brother    Heart attack Sister    Breast cancer Other        first cousin x 2   Stroke Daughter    Heart attack Daughter    Colon cancer Neg Hx    Social History   Socioeconomic History   Marital status: Widowed    Spouse name: Not on file   Number of children: 2   Years of education: Not on file   Highest education level: Not on file  Occupational History   Not on file  Tobacco Use   Smoking status: Never   Smokeless tobacco: Never  Vaping Use   Vaping Use: Never used  Substance and Sexual Activity   Alcohol use: No    Alcohol/week: 0.0 standard drinks   Drug use: No   Sexual activity: Not Currently    Birth control/protection: Post-menopausal  Other Topics Concern   Not on file  Social History Narrative   Not on file   Social Determinants of Health   Financial Resource Strain: Not on file  Food Insecurity: Not on file  Transportation Needs: Not on file  Physical Activity: Not on file  Stress: Not on file  Social Connections: Not on file     Review of Systems  Constitutional:  Negative for appetite change, fever and unexpected weight change.  HENT:  Negative for congestion and sinus pressure.   Respiratory:  Positive for shortness of breath. Negative for cough and chest tightness.    Cardiovascular:  Positive for leg swelling. Negative for chest pain and palpitations.  Gastrointestinal:  Negative for abdominal pain, diarrhea and vomiting.       Black stools as outlined.    Genitourinary:  Negative for difficulty urinating and dysuria.  Musculoskeletal:  Negative for joint swelling and myalgias.  Skin:  Negative for wound.       Venostasis changes - lower extremities.   Neurological:  Negative for dizziness and headaches.  Psychiatric/Behavioral:  Negative for agitation and dysphoric  mood.       Objective:     BP 130/78 (BP Location: Left Arm, Patient Position: Sitting, Cuff Size: Normal)    Pulse 68    Temp (!) 97.5 F (36.4 C) (Temporal)    Ht 5' (1.524 m)    Wt 195 lb 6.4 oz (88.6 kg)    LMP 05/01/1972    SpO2 99%    BMI 38.16 kg/m  Wt Readings from Last 3 Encounters:  07/06/21 195 lb 6.4 oz (88.6 kg)  07/05/21 196 lb (88.9 kg)  06/30/21 194 lb (88 kg)    Physical Exam Vitals reviewed.  Constitutional:      General: She is not in acute distress.    Appearance: Normal appearance.  HENT:     Head: Normocephalic and atraumatic.     Right Ear: External ear normal.     Left Ear: External ear normal.  Eyes:     General: No scleral icterus.       Right eye: No discharge.        Left eye: No discharge.     Conjunctiva/sclera: Conjunctivae normal.  Neck:     Thyroid: No thyromegaly.  Cardiovascular:     Rate and Rhythm: Normal rate and regular rhythm.  Pulmonary:     Effort: No respiratory distress.     Breath sounds: Normal breath sounds. No wheezing.  Abdominal:     General: Bowel sounds are normal.     Palpations: Abdomen is soft.     Tenderness: There is no abdominal tenderness.  Genitourinary:    Comments: Rectal - heme positive.  Musculoskeletal:        General: Swelling present. No tenderness.     Cervical back: Neck supple. No tenderness.  Lymphadenopathy:     Cervical: No cervical adenopathy.  Skin:    Findings: No rash.     Comments:  Venostasis changes - lower extremities.   Neurological:     Mental Status: She is alert.  Psychiatric:        Mood and Affect: Mood normal.        Behavior: Behavior normal.     Outpatient Encounter Medications as of 07/06/2021  Medication Sig   acetaminophen (TYLENOL) 500 MG tablet Take 500 mg by mouth daily as needed for headache (pain).   albuterol (PROVENTIL HFA;VENTOLIN HFA) 108 (90 Base) MCG/ACT inhaler Inhale 2 puffs into the lungs every 6 (six) hours as needed for wheezing or shortness of breath.   amiodarone (PACERONE) 200 MG tablet TAKE 1 TABLET BY MOUTH DAILY   aspirin EC 81 MG tablet Take 1 tablet (81 mg total) by mouth daily.   Azelastine HCl 137 MCG/SPRAY SOLN USE 1 TO 2 SPRAYS IN EACH NOSTRIL TWICE A DAY AS NEEDED FOR DRAINAGE   cephALEXin (KEFLEX) 500 MG capsule Take 500 mg by mouth 2 (two) times daily.   cholecalciferol (VITAMIN D) 1000 units tablet Take 1,000 Units by mouth 2 (two) times daily.   ELIQUIS 5 MG TABS tablet TAKE 1 TABLET BY MOUTH TWICE A DAY   fish oil-omega-3 fatty acids 1000 MG capsule Take 1 g by mouth 2 (two) times daily.    fluticasone-salmeterol (ADVAIR HFA) 230-21 MCG/ACT inhaler Inhale 2 puffs into the lungs 2 (two) times daily.   furosemide (LASIX) 40 MG tablet Take 1 tablet (40 mg total) by mouth 2 (two) times daily. Dr. Rockey Situ advised alternate 40 mg twice a day with 40 mg daily every other day   hydrALAZINE (APRESOLINE) 10  MG tablet TAKE ONE TABLET BY MOUTH THREE TIMES A DAY   losartan (COZAAR) 100 MG tablet TAKE 1 TABLET BY MOUTH DAILY   Misc Natural Products (OSTEO BI-FLEX JOINT SHIELD PO) Take 1 tablet by mouth 2 (two) times daily.    Multiple Vitamin (MULTIVITAMIN WITH MINERALS) TABS tablet Take 0.5 tablets by mouth 2 (two) times daily.   mupirocin ointment (BACTROBAN) 2 % Apply 1 application topically 2 (two) times daily.   nitroGLYCERIN (NITROSTAT) 0.4 MG SL tablet Place 1 tablet (0.4 mg total) under the tongue every 5 (five) minutes as  needed.   pantoprazole (PROTONIX) 40 MG tablet TAKE 1 TABLET BY MOUTH DAILY   Polyethyl Glycol-Propyl Glycol (SYSTANE OP) Place 1 drop into both eyes daily as needed (dry eyes).   potassium chloride (KLOR-CON) 10 MEQ tablet TAKE 1 TABLET BY MOUTH TWICE A DAY   PRESCRIPTION MEDICATION Inhale into the lungs at bedtime. CPAP   propranolol (INDERAL) 20 MG tablet TAKE ONE TABLET BY MOUTH THREE TIMES A DAY   rosuvastatin (CRESTOR) 40 MG tablet TAKE 1 TABLET BY MOUTH DAILY   sucralfate (CARAFATE) 1 g tablet Take 1 g by mouth 2 (two) times daily.   traMADol (ULTRAM) 50 MG tablet Take 1 tablet (50 mg total) by mouth 2 (two) times daily as needed.   No facility-administered encounter medications on file as of 07/06/2021.     Lab Results  Component Value Date   WBC 6.2 01/20/2021   HGB 12.9 01/20/2021   HCT 39.4 01/20/2021   PLT 197.0 01/20/2021   GLUCOSE 98 04/06/2021   CHOL 129 01/20/2021   TRIG 80.0 01/20/2021   HDL 50.90 01/20/2021   LDLCALC 62 01/20/2021   ALT 33 04/06/2021   AST 61 (H) 04/06/2021   NA 139 04/06/2021   K 3.8 04/06/2021   CL 105 04/06/2021   CREATININE 1.19 04/06/2021   BUN 20 04/06/2021   CO2 29 04/06/2021   TSH 3.91 04/06/2021   INR 0.99 03/25/2016   HGBA1C 6.2 01/20/2021   MICROALBUR <0.7 04/29/2020    CT Abdomen Pelvis Wo Contrast  Result Date: 11/30/2020 CLINICAL DATA:  Abdominal pain EXAM: CT ABDOMEN AND PELVIS WITHOUT CONTRAST TECHNIQUE: Multidetector CT imaging of the abdomen and pelvis was performed following the standard protocol without IV contrast. COMPARISON:  02/18/2015 FINDINGS: Lower chest: Large hiatal hernia. Contrast seen within the distal esophagus and hernia, likely reflux. Coronary artery and aortic calcifications. Hepatobiliary: No focal hepatic abnormality. Gallbladder unremarkable. Pancreas: No focal abnormality or ductal dilatation. Spleen: No focal abnormality.  Normal size. Adrenals/Urinary Tract: No adrenal abnormality. No focal renal  abnormality. No stones or hydronephrosis. Urinary bladder is unremarkable. Stomach/Bowel: Left colonic diverticulosis. No active diverticulitis. No evidence of bowel obstruction. Vascular/Lymphatic: Aortic calcifications. No evidence of aneurysm or adenopathy. Reproductive: Prior hysterectomy.  No adnexal masses. Other: No free fluid or free air. Musculoskeletal: No acute bony abnormality. IMPRESSION: Left colonic diverticulosis.  No active diverticulitis. Large hiatal hernia with probable gastroesophageal reflux. Coronary artery disease, aortic atherosclerosis. No acute findings in the abdomen or pelvis. Electronically Signed   By: Rolm Baptise M.D.   On: 11/30/2020 08:20       Assessment & Plan:   Problem List Items Addressed This Visit     Aortic atherosclerosis ([place])    Continue crestor.       Chronic heart failure with preserved ejection fraction (HCC)    Chronic lower extremity swelling.  Unable to wear compression hose.  Wanted a break from  unna boots.  Ace wraps.  Weight as outlined.  Continues on losartan.  Taking lasix as directed.  Monitoring weights.  Have to monitor blood pressure - given decrease.  D/w cardiology regarding further evaluation.       CKD (chronic kidney disease), stage III (HCC)    Avoid antiinflammatories.  Check metabolic panel today.       Relevant Orders   Basic metabolic panel   Coronary artery disease involving native coronary artery of native heart with unstable angina pectoris (HCC)    No chest pain - continues hydralazine, losartan and eliquis.  Does report worsening sob with exertion.  (has chronic sob, but describes noticing some worsening).  EKG - LBBB.  Check cxr.  Discussed f/u with Dr Rockey Situ.  Monitor weights.  Lasix as directed.  Follow pressures.       Diabetes mellitus ([place])    Low carb diet and exercise.  Follow met b and a1c.       DOE (dyspnea on exertion)    Worsening sob as outlined. EKG - LBBB.   No increased cough and congestion.   Will check cxr.  She request referral to Dr Laural Roes.  She is taking and adjusting lasix. Discussed.  Blood pressure running lower.  Monitor pressures and follow with gentle diuresis.  Also with melana and heme positive.  Check cbc.  Restart protonix.  Discuss with GI and cardiology.       Relevant Orders   Basic metabolic panel   DG Chest 2 View (Completed)   GERD (gastroesophageal reflux disease)    She had been on protonix and carafate.  Recent H. Pylori negative.  Off PPI and pepcid.  No acid reflux reported. Did report black stools.  Heme positive on exam.  Check cbc.  Restart protonix.  Arrange f/u with GI.       Lymphedema   Paroxysmal atrial fibrillation (HCC)    On eliquis and amiodarone.  Stable.  Followed by cardiology.       SOB (shortness of breath) - Primary   Relevant Orders   EKG 12-Lead (Completed)   DG Chest 2 View (Completed)   Thrombophilia (HCC)    Afib. On eliquid.       Essential hypertension, benign (Chronic)    On losartan.  Taking lasix as outlined.  Blood pressure running lower.  Recheck by me 108/60.  Have to watch with extra dosing of lasix - monitor pressures.  Hold on making adjustments at this time.  Check cbc as outlined.        Hypercholesterolemia (Chronic)   Relevant Orders   CBC with Differential/Platelet   Hepatic function panel   TSH   Obstructive sleep apnea (Chronic)    CPAP.         Einar Pheasant, MD

## 2021-07-07 ENCOUNTER — Other Ambulatory Visit: Payer: Self-pay

## 2021-07-07 ENCOUNTER — Encounter: Payer: Self-pay | Admitting: Emergency Medicine

## 2021-07-07 ENCOUNTER — Inpatient Hospital Stay
Admission: EM | Admit: 2021-07-07 | Discharge: 2021-07-12 | DRG: 378 | Disposition: A | Discharge status: Home / Self Care | Payer: Medicare Other | Attending: Internal Medicine | Admitting: Internal Medicine

## 2021-07-07 ENCOUNTER — Ambulatory Visit (INDEPENDENT_AMBULATORY_CARE_PROVIDER_SITE_OTHER): Payer: Medicare Other | Admitting: Nurse Practitioner

## 2021-07-07 ENCOUNTER — Other Ambulatory Visit (INDEPENDENT_AMBULATORY_CARE_PROVIDER_SITE_OTHER): Payer: Medicare Other

## 2021-07-07 VITALS — BP 90/31 | HR 53 | Resp 14 | Ht 60.0 in | Wt 195.0 lb

## 2021-07-07 DIAGNOSIS — Z7982 Long term (current) use of aspirin: Secondary | ICD-10-CM

## 2021-07-07 DIAGNOSIS — M810 Age-related osteoporosis without current pathological fracture: Secondary | ICD-10-CM | POA: Diagnosis present

## 2021-07-07 DIAGNOSIS — K921 Melena: Secondary | ICD-10-CM | POA: Diagnosis not present

## 2021-07-07 DIAGNOSIS — Z79899 Other long term (current) drug therapy: Secondary | ICD-10-CM

## 2021-07-07 DIAGNOSIS — I440 Atrioventricular block, first degree: Secondary | ICD-10-CM | POA: Diagnosis present

## 2021-07-07 DIAGNOSIS — N184 Chronic kidney disease, stage 4 (severe): Secondary | ICD-10-CM | POA: Diagnosis present

## 2021-07-07 DIAGNOSIS — Z20822 Contact with and (suspected) exposure to covid-19: Secondary | ICD-10-CM | POA: Diagnosis present

## 2021-07-07 DIAGNOSIS — Z1211 Encounter for screening for malignant neoplasm of colon: Secondary | ICD-10-CM | POA: Diagnosis not present

## 2021-07-07 DIAGNOSIS — L538 Other specified erythematous conditions: Secondary | ICD-10-CM | POA: Diagnosis not present

## 2021-07-07 DIAGNOSIS — E7800 Pure hypercholesterolemia, unspecified: Secondary | ICD-10-CM

## 2021-07-07 DIAGNOSIS — K449 Diaphragmatic hernia without obstruction or gangrene: Secondary | ICD-10-CM | POA: Diagnosis present

## 2021-07-07 DIAGNOSIS — K2960 Other gastritis without bleeding: Secondary | ICD-10-CM | POA: Diagnosis not present

## 2021-07-07 DIAGNOSIS — K922 Gastrointestinal hemorrhage, unspecified: Secondary | ICD-10-CM | POA: Diagnosis present

## 2021-07-07 DIAGNOSIS — G4730 Sleep apnea, unspecified: Secondary | ICD-10-CM | POA: Diagnosis not present

## 2021-07-07 DIAGNOSIS — I10 Essential (primary) hypertension: Secondary | ICD-10-CM | POA: Diagnosis not present

## 2021-07-07 DIAGNOSIS — K635 Polyp of colon: Secondary | ICD-10-CM | POA: Diagnosis present

## 2021-07-07 DIAGNOSIS — I252 Old myocardial infarction: Secondary | ICD-10-CM | POA: Diagnosis not present

## 2021-07-07 DIAGNOSIS — N183 Chronic kidney disease, stage 3 unspecified: Secondary | ICD-10-CM | POA: Diagnosis not present

## 2021-07-07 DIAGNOSIS — R0609 Other forms of dyspnea: Secondary | ICD-10-CM | POA: Diagnosis not present

## 2021-07-07 DIAGNOSIS — Z8262 Family history of osteoporosis: Secondary | ICD-10-CM

## 2021-07-07 DIAGNOSIS — I2510 Atherosclerotic heart disease of native coronary artery without angina pectoris: Secondary | ICD-10-CM | POA: Diagnosis not present

## 2021-07-07 DIAGNOSIS — N179 Acute kidney failure, unspecified: Secondary | ICD-10-CM | POA: Diagnosis present

## 2021-07-07 DIAGNOSIS — I130 Hypertensive heart and chronic kidney disease with heart failure and stage 1 through stage 4 chronic kidney disease, or unspecified chronic kidney disease: Secondary | ICD-10-CM | POA: Diagnosis present

## 2021-07-07 DIAGNOSIS — I480 Paroxysmal atrial fibrillation: Secondary | ICD-10-CM | POA: Diagnosis not present

## 2021-07-07 DIAGNOSIS — Z9861 Coronary angioplasty status: Secondary | ICD-10-CM | POA: Diagnosis not present

## 2021-07-07 DIAGNOSIS — N1832 Chronic kidney disease, stage 3b: Secondary | ICD-10-CM | POA: Diagnosis not present

## 2021-07-07 DIAGNOSIS — I959 Hypotension, unspecified: Secondary | ICD-10-CM | POA: Diagnosis present

## 2021-07-07 DIAGNOSIS — G43909 Migraine, unspecified, not intractable, without status migrainosus: Secondary | ICD-10-CM | POA: Diagnosis present

## 2021-07-07 DIAGNOSIS — K319 Disease of stomach and duodenum, unspecified: Secondary | ICD-10-CM | POA: Diagnosis not present

## 2021-07-07 DIAGNOSIS — Z955 Presence of coronary angioplasty implant and graft: Secondary | ICD-10-CM

## 2021-07-07 DIAGNOSIS — Z882 Allergy status to sulfonamides status: Secondary | ICD-10-CM

## 2021-07-07 DIAGNOSIS — D509 Iron deficiency anemia, unspecified: Secondary | ICD-10-CM | POA: Diagnosis not present

## 2021-07-07 DIAGNOSIS — Z90710 Acquired absence of both cervix and uterus: Secondary | ICD-10-CM

## 2021-07-07 DIAGNOSIS — I25110 Atherosclerotic heart disease of native coronary artery with unstable angina pectoris: Secondary | ICD-10-CM | POA: Diagnosis present

## 2021-07-07 DIAGNOSIS — I447 Left bundle-branch block, unspecified: Secondary | ICD-10-CM | POA: Diagnosis present

## 2021-07-07 DIAGNOSIS — K3189 Other diseases of stomach and duodenum: Secondary | ICD-10-CM | POA: Diagnosis present

## 2021-07-07 DIAGNOSIS — G4733 Obstructive sleep apnea (adult) (pediatric): Secondary | ICD-10-CM | POA: Diagnosis not present

## 2021-07-07 DIAGNOSIS — K640 First degree hemorrhoids: Secondary | ICD-10-CM | POA: Diagnosis not present

## 2021-07-07 DIAGNOSIS — Z86010 Personal history of colonic polyps: Secondary | ICD-10-CM | POA: Diagnosis not present

## 2021-07-07 DIAGNOSIS — K219 Gastro-esophageal reflux disease without esophagitis: Secondary | ICD-10-CM | POA: Diagnosis present

## 2021-07-07 DIAGNOSIS — D126 Benign neoplasm of colon, unspecified: Secondary | ICD-10-CM | POA: Diagnosis not present

## 2021-07-07 DIAGNOSIS — E1122 Type 2 diabetes mellitus with diabetic chronic kidney disease: Secondary | ICD-10-CM | POA: Diagnosis present

## 2021-07-07 DIAGNOSIS — R001 Bradycardia, unspecified: Secondary | ICD-10-CM | POA: Diagnosis not present

## 2021-07-07 DIAGNOSIS — I890 Lymphedema, not elsewhere classified: Secondary | ICD-10-CM

## 2021-07-07 DIAGNOSIS — Z8249 Family history of ischemic heart disease and other diseases of the circulatory system: Secondary | ICD-10-CM | POA: Diagnosis not present

## 2021-07-07 DIAGNOSIS — Z880 Allergy status to penicillin: Secondary | ICD-10-CM

## 2021-07-07 DIAGNOSIS — Z7901 Long term (current) use of anticoagulants: Secondary | ICD-10-CM

## 2021-07-07 DIAGNOSIS — I5032 Chronic diastolic (congestive) heart failure: Secondary | ICD-10-CM | POA: Diagnosis present

## 2021-07-07 DIAGNOSIS — L539 Erythematous condition, unspecified: Secondary | ICD-10-CM | POA: Diagnosis present

## 2021-07-07 DIAGNOSIS — R195 Other fecal abnormalities: Secondary | ICD-10-CM | POA: Diagnosis not present

## 2021-07-07 DIAGNOSIS — K5730 Diverticulosis of large intestine without perforation or abscess without bleeding: Secondary | ICD-10-CM | POA: Diagnosis present

## 2021-07-07 DIAGNOSIS — D649 Anemia, unspecified: Secondary | ICD-10-CM | POA: Diagnosis not present

## 2021-07-07 DIAGNOSIS — R799 Abnormal finding of blood chemistry, unspecified: Secondary | ICD-10-CM | POA: Diagnosis not present

## 2021-07-07 DIAGNOSIS — Z7951 Long term (current) use of inhaled steroids: Secondary | ICD-10-CM

## 2021-07-07 DIAGNOSIS — J383 Other diseases of vocal cords: Secondary | ICD-10-CM | POA: Diagnosis present

## 2021-07-07 LAB — HEPATIC FUNCTION PANEL
ALT: 26 U/L (ref 0–35)
AST: 47 U/L — ABNORMAL HIGH (ref 0–37)
Albumin: 3 g/dL — ABNORMAL LOW (ref 3.5–5.2)
Alkaline Phosphatase: 74 U/L (ref 39–117)
Bilirubin, Direct: 0.2 mg/dL (ref 0.0–0.3)
Total Bilirubin: 0.6 mg/dL (ref 0.2–1.2)
Total Protein: 5.3 g/dL — ABNORMAL LOW (ref 6.0–8.3)

## 2021-07-07 LAB — BASIC METABOLIC PANEL
BUN: 33 mg/dL — ABNORMAL HIGH (ref 6–23)
CO2: 26 mEq/L (ref 19–32)
Calcium: 8.6 mg/dL (ref 8.4–10.5)
Chloride: 106 mEq/L (ref 96–112)
Creatinine, Ser: 1.75 mg/dL — ABNORMAL HIGH (ref 0.40–1.20)
GFR: 26.77 mL/min — ABNORMAL LOW (ref >60.00)
Glucose, Bld: 108 mg/dL — ABNORMAL HIGH (ref 70–99)
Potassium: 4.6 mEq/L (ref 3.5–5.1)
Sodium: 139 mEq/L (ref 135–145)

## 2021-07-07 LAB — URINALYSIS, ROUTINE W REFLEX MICROSCOPIC
Bacteria, UA: NONE SEEN
Bilirubin Urine: NEGATIVE
Glucose, UA: NEGATIVE mg/dL
Ketones, ur: NEGATIVE mg/dL
Nitrite: NEGATIVE
Protein, ur: NEGATIVE mg/dL
Specific Gravity, Urine: 1.008 (ref 1.005–1.030)
pH: 5 (ref 5.0–8.0)

## 2021-07-07 LAB — CBC WITH DIFFERENTIAL/PLATELET
Basophils Absolute: 0 10*3/uL (ref 0.0–0.1)
Basophils Relative: 0.5 % (ref 0.0–3.0)
Eosinophils Absolute: 0.2 10*3/uL (ref 0.0–0.7)
Eosinophils Relative: 2.4 % (ref 0.0–5.0)
HCT: 26.1 % — ABNORMAL LOW (ref 36.0–46.0)
Hemoglobin: 8.6 g/dL — ABNORMAL LOW (ref 12.0–15.0)
Lymphocytes Relative: 30.9 % (ref 12.0–46.0)
Lymphs Abs: 2.2 10*3/uL (ref 0.7–4.0)
MCHC: 32.9 g/dL (ref 30.0–36.0)
MCV: 96.8 fl (ref 78.0–100.0)
Monocytes Absolute: 1.2 10*3/uL — ABNORMAL HIGH (ref 0.1–1.0)
Monocytes Relative: 16.1 % — ABNORMAL HIGH (ref 3.0–12.0)
Neutro Abs: 3.6 10*3/uL (ref 1.4–7.7)
Neutrophils Relative %: 50.1 % (ref 43.0–77.0)
Platelets: 286 10*3/uL (ref 150.0–400.0)
RBC: 2.69 Mil/uL — ABNORMAL LOW (ref 3.87–5.11)
RDW: 14.4 % (ref 11.5–15.5)
WBC: 7.2 10*3/uL (ref 4.0–10.5)

## 2021-07-07 LAB — TSH: TSH: 2.21 u[IU]/mL (ref 0.35–5.50)

## 2021-07-07 LAB — RESP PANEL BY RT-PCR (FLU A&B, COVID) ARPGX2
Influenza A by PCR: NEGATIVE
Influenza B by PCR: NEGATIVE
SARS Coronavirus 2 by RT PCR: NEGATIVE

## 2021-07-07 MED ORDER — PANTOPRAZOLE 80MG IVPB - SIMPLE MED
80.0000 mg | Freq: Once | INTRAVENOUS | Status: AC
  Administered 2021-07-07: 21:00:00 80 mg via INTRAVENOUS
  Filled 2021-07-07: qty 100

## 2021-07-07 MED ORDER — SODIUM CHLORIDE 0.9% FLUSH
3.0000 mL | Freq: Two times a day (BID) | INTRAVENOUS | Status: DC
  Administered 2021-07-07 – 2021-07-12 (×7): 3 mL via INTRAVENOUS

## 2021-07-07 MED ORDER — ACETAMINOPHEN 650 MG RE SUPP: 650.0000 mg | Freq: Four times a day (QID) | RECTAL | Status: DC | PRN

## 2021-07-07 MED ORDER — PANTOPRAZOLE SODIUM 40 MG IV SOLR
40.0000 mg | Freq: Two times a day (BID) | INTRAVENOUS | Status: DC
  Administered 2021-07-11 – 2021-07-12 (×3): 40 mg via INTRAVENOUS
  Filled 2021-07-07 (×3): qty 10

## 2021-07-07 MED ORDER — AMIODARONE HCL 200 MG PO TABS
200.0000 mg | ORAL_TABLET | Freq: Every day | ORAL | Status: DC
  Administered 2021-07-08 – 2021-07-12 (×4): 200 mg via ORAL
  Filled 2021-07-07 (×5): qty 1

## 2021-07-07 MED ORDER — ACETAMINOPHEN 325 MG PO TABS
650.0000 mg | ORAL_TABLET | Freq: Four times a day (QID) | ORAL | Status: DC | PRN
  Administered 2021-07-07 – 2021-07-11 (×3): 650 mg via ORAL
  Filled 2021-07-07 (×3): qty 2

## 2021-07-07 MED ORDER — PANTOPRAZOLE INFUSION (NEW) - SIMPLE MED
8.0000 mg/h | INTRAVENOUS | Status: DC
  Administered 2021-07-07 – 2021-07-10 (×4): 8 mg/h via INTRAVENOUS
  Filled 2021-07-07 (×6): qty 100

## 2021-07-07 MED ORDER — SODIUM CHLORIDE 0.9 % IV SOLN
INTRAVENOUS | Status: AC
Start: 2021-07-07 — End: 2021-07-08

## 2021-07-07 NOTE — ED Notes (Addendum)
Secure msg sent to Shary Decamp, RN for ED to [employer]. ?

## 2021-07-07 NOTE — Assessment & Plan Note (Signed)
She had been on protonix and carafate.  Recent H. Pylori negative.  Off PPI and pepcid.  No acid reflux reported. Did report black stools.  Heme positive on exam.  Check cbc.  Restart protonix.  Arrange f/u with GI.  ?

## 2021-07-07 NOTE — Assessment & Plan Note (Signed)
On eliquis and amiodarone.  Stable.  Followed by cardiology.  ?

## 2021-07-07 NOTE — Progress Notes (Signed)
Patient is having a friend drive her to the ED and I have called and notified triage nurse at ED of patients labs and to be expecting her Nurse Name Delilah Shan.

## 2021-07-07 NOTE — ED Triage Notes (Signed)
Pt comes into the ED via POV from her PCP c/o abnormal labs.  Pt had a Hgb come back 8.6 and her Gfr at 27.7.  Pt states the only symptoms she has been having in generalized weakness and decreased urine output.  Pt ambulatory to triage with even and unlabored respirations.  ?

## 2021-07-07 NOTE — ED Notes (Signed)
Pt here for abnormal labs, sent by PCP. Pt states she was told this could be why she feels weak. Pt denies any pain. Pt states she has had black tarry stools x1 month. ?

## 2021-07-07 NOTE — ED Notes (Signed)
Pink top sent to lab for type & screen. ?

## 2021-07-07 NOTE — Assessment & Plan Note (Signed)
Worsening sob as outlined. EKG - LBBB.   No increased cough and congestion.  Will check cxr.  She request referral to Dr Laural Roes.  She is taking and adjusting lasix. Discussed.  Blood pressure running lower.  Monitor pressures and follow with gentle diuresis.  Also with melana and heme positive.  Check cbc.  Restart protonix.  Discuss with GI and cardiology.  ?

## 2021-07-07 NOTE — Assessment & Plan Note (Signed)
Low carb diet and exercise.  Follow met b and a1c.  ?

## 2021-07-07 NOTE — Assessment & Plan Note (Signed)
Chronic lower extremity swelling.  Unable to wear compression hose.  Wanted a break from [place] boots.  Ace wraps.  Weight as outlined.  Continues on losartan.  Taking lasix as directed.  Monitoring weights.  Have to monitor blood pressure - given decrease.  D/w cardiology regarding further evaluation.  ?

## 2021-07-07 NOTE — ED Notes (Signed)
Secure msg sent to Sheryle Spray, RN for ED to [facility]. ?

## 2021-07-07 NOTE — ED Provider Notes (Incomplete)
[facility] Provider Note    Event Date/Time   First MD Initiated Contact with Patient 07/07/21 1853     (approximate)   History   Abnormal Lab   HPI {Remember to add pertinent medical, surgical, social, and/or OB history to HPI:1} April [name] is a 81 y.o. female  who, per PCP note dated yesterday, has history of CHF, CKD, CAD, DM, dyspnea on exertion, who presents to the emergency department today because of        Physical Exam   Triage Vital Signs: ED Triage Vitals  Enc Vitals Group     BP 07/07/21 1703 (!) 139/53     Pulse Rate 07/07/21 1703 (!) 55     Resp 07/07/21 1703 18     Temp 07/07/21 1703 98.6 F (37 C)     Temp Source 07/07/21 1703 Oral     SpO2 07/07/21 1703 98 %     Weight 07/07/21 1647 195 lb (88.5 kg)     Height 07/07/21 1647 5' (1.524 m)     Head Circumference --      Peak Flow --      Pain Score 07/07/21 1647 0     Pain Loc --      Pain Edu? --      Excl. in [place]? --     Most recent vital signs: Vitals:   07/07/21 1703  BP: (!) 139/53  Pulse: (!) 55  Resp: 18  Temp: 98.6 F (37 C)  SpO2: 98%    {Only need to document appropriate and relevant physical exam:1} General: Awake, no distress. *** CV:  Good peripheral perfusion. *** Resp:  Normal effort. *** Abd:  No distention. *** Other:  ***   ED Results / Procedures / Treatments   Labs (all labs ordered are listed, but only abnormal results are displayed) Labs Reviewed  URINALYSIS, ROUTINE W REFLEX MICROSCOPIC - Abnormal; Notable for the following components:      Result Value   Color, Urine STRAW (*)    APPearance HAZY (*)    Hgb urine dipstick SMALL (*)    Leukocytes,Ua SMALL (*)    All other components within normal limits     EKG  ***   RADIOLOGY *** {You MUST document your own interpretation of imaging, as well as the fact that you reviewed the radiologist's report!:1}   PROCEDURES:  Critical Care performed:  {CriticalCareYesNo:19197::"Yes, see critical care procedure note(s)","No"}  Procedures   MEDICATIONS ORDERED IN ED: Medications - No data to display   IMPRESSION / MDM / [place] / ED COURSE  I reviewed the triage vital signs and the nursing notes.                              Differential diagnosis includes, but is not limited to, ***  {If the patient is on the monitor, remove the brackets and asterisks on the sentence below and remember to document it as a Procedure as well. Otherwise delete the sentence below:1} {**The patient is on the cardiac monitor to evaluate for evidence of arrhythmia and/or significant heart rate changes.**} {Remember to include, when applicable, any/all of the following data: independent review of imaging independent review of labs (comment specifically on pertinent positives and negatives) review of specific prior hospitalizations, PCP/specialist notes, etc. discuss meds given and prescribed document any discussion with consultants (including hospitalists) any clinical decision tools you used and why (  PECARN, NEXUS, etc.) did you consider admitting the patient? document social determinants of health affecting patient's care (homelessness, inability to follow up in a timely fashion, etc) document any pre-existing conditions increasing risk on current visit (e.g. diabetes and HTN increasing danger of high-risk chest pain/ACS) describes what meds you gave (especially parenteral) and why any other interventions?:1}     FINAL CLINICAL IMPRESSION(S) / ED DIAGNOSES   Final diagnoses:  None     Rx / DC Orders   ED Discharge Orders     None        Note:  This document was prepared using Dragon voice recognition software and may include unintentional dictation errors.

## 2021-07-07 NOTE — Assessment & Plan Note (Signed)
No chest pain - continues hydralazine, losartan and eliquis.  Does report worsening sob with exertion.  (has chronic sob, but describes noticing some worsening).  EKG - LBBB.  Check cxr.  Discussed f/u with Dr Rockey Situ.  Monitor weights.  Lasix as directed.  Follow pressures.  ?

## 2021-07-07 NOTE — Progress Notes (Signed)
History of Present Illness  There is no documented history at this time  Assessments & Plan   There are no diagnoses linked to this encounter.    Additional instructions  Subjective:  Patient presents with venous ulcer of the Bilateral lower extremity.    Procedure:  3 layer unna wrap was placed Bilateral lower extremity.   Plan:   Follow up in one week.  

## 2021-07-07 NOTE — ED Notes (Signed)
Secure msg sent to Dr. Myna Hidalgo re: pt wanting to wait to take the protonix until she talks to MD about it. ?

## 2021-07-07 NOTE — ED Provider Notes (Signed)
? ?[facility] ?Provider Note ? ? ? Event Date/Time  ? First MD Initiated Contact with Patient 07/07/21 1853   ?  (approximate) ? ? ?History  ? ?Shortness of breath ? ? ?HPI ? ?April [name] is a 81 y.o. female  who, per PCP note dated yesterday, has history of CHF, CKD, CAD, DM, dyspnea on exertion, who presents to the emergency department today because of anemia found on blood work.  Patient went to primary care doctor's office complaining of worsening shortness of breath.  She states that she has been short of breath for quite some time although it has been worse over the past couple weeks.  She had also noticed some black stool over the past couple weeks.  Patient is on Eliquis secondary to A-fib.  The patient had blood work done at primary care doctor's office which found her hemoglobin to be low. ? ?  ? ? ?Physical Exam  ? ?Triage Vital Signs: ?ED Triage Vitals  ?Enc Vitals Group  ?   BP 07/07/21 1703 (!) 139/53  ?   Pulse Rate 07/07/21 1703 (!) 55  ?   Resp 07/07/21 1703 18  ?   Temp 07/07/21 1703 98.6 ?F (37 ?C)  ?   Temp Source 07/07/21 1703 Oral  ?   SpO2 07/07/21 1703 98 %  ?   Weight 07/07/21 1647 195 lb (88.5 kg)  ?   Height 07/07/21 1647 5' (1.524 m)  ?   Head Circumference --   ?   Peak Flow --   ?   Pain Score 07/07/21 1647 0  ? ?Most recent vital signs: ?Vitals:  ? 07/07/21 1703  ?BP: (!) 139/53  ?Pulse: (!) 55  ?Resp: 18  ?Temp: 98.6 ?F (37 ?C)  ?SpO2: 98%  ? ? ?General: Awake, no distress.  ?CV:  Good peripheral perfusion. Bradycardia. ?Resp:  Normal effort. Lungs clear to auscultation ?Abd:  No distention. Non tender. ? ? ?ED Results / Procedures / Treatments  ? ?Labs ?(all labs ordered are listed, but only abnormal results are displayed) ?Labs Reviewed  ?URINALYSIS, ROUTINE W REFLEX MICROSCOPIC - Abnormal; Notable for the following components:  ?    Result Value  ? Color, Urine STRAW (*)   ? APPearance HAZY (*)   ? Hgb urine dipstick SMALL (*)   ? Leukocytes,Ua SMALL (*)    ? All other components within normal limits  ?RESP PANEL BY RT-PCR (FLU A&B, COVID) [id number]  ?TYPE AND SCREEN  ? ? ? ?EKG ? ?INance Pear, attending physician, personally viewed and interpreted this EKG ? ?EKG Time: 1700 ?Rate: 56 ?Rhythm: sinus bradycardia with 1st degree av block ?Axis: normal ?Intervals: qtc 492 ?QRS: wide ?ST changes: no st elevation ?Impression: abnormal ekg ? ?RADIOLOGY ?None ? ? ? ?PROCEDURES: ? ?Critical Care performed: No ? ?Procedures ? ? ?MEDICATIONS ORDERED IN ED: ?Medications  ?pantoprazole (PROTONIX) 80 mg /NS 100 mL IVPB (has no administration in time range)  ?pantoprozole (PROTONIX) 80 mg /NS 100 mL infusion (has no administration in time range)  ?pantoprazole (PROTONIX) injection 40 mg (has no administration in time range)  ? ? ? ?IMPRESSION / MDM / ASSESSMENT AND PLAN / ED COURSE  ?I reviewed the triage vital signs and the nursing notes. ?             ?               ? ?Differential diagnosis includes, but is not limited to,  anemia secondary to acute blood loss, secondary to chronic disease.  ? ?Patient presented to the emergency department today because of concerns for abnormal blood work obtained by primary care earlier today.  Patient was found to be anemic.  Was complaining of black stool and per primary care doctor's note tested positive for Hemoccult.  Patient is on Eliquis for A-fib.  Given the fact patient is on Eliquis and is symptomatic from her anemia do think she would benefit from inpatient admission.  We will start IV Protonix.  Discussed plan with patient.  Discussed with Dr. Myna Hidalgo with hospitalist who will plan on admission. ? ? ?FINAL CLINICAL IMPRESSION(S) / ED DIAGNOSES  ? ?Final diagnoses:  ?Symptomatic anemia  ?Gastrointestinal hemorrhage, unspecified gastrointestinal hemorrhage type  ? ? ?Note:  This document was prepared using Dragon voice recognition software and may include unintentional dictation errors. ? ?  ?Nance Pear, MD ?07/07/21 2025 ? ?

## 2021-07-07 NOTE — ED Notes (Signed)
Pt would like to wait to take the protonix until she talks to admitting MD. She was taken off of this & her baseline queasiness went away, so her MD put her on something else, but she has not had acid reflux in a while. ?

## 2021-07-07 NOTE — Assessment & Plan Note (Signed)
Avoid antiinflammatories.  Check metabolic panel today.  ?

## 2021-07-07 NOTE — Assessment & Plan Note (Signed)
Continue crestor 

## 2021-07-07 NOTE — Assessment & Plan Note (Signed)
CPAP.  

## 2021-07-07 NOTE — ED Notes (Signed)
Dr. Opyd at bedside  

## 2021-07-07 NOTE — Assessment & Plan Note (Signed)
On losartan.  Taking lasix as outlined.  Blood pressure running lower.  Recheck by me 108/60.  Have to watch with extra dosing of lasix - monitor pressures.  Hold on making adjustments at this time.  Check cbc as outlined.   ?

## 2021-07-07 NOTE — ED Notes (Signed)
This RN called [facility] to have them put the purple man in for report to receiving RN. ?

## 2021-07-07 NOTE — H&P (Signed)
History and Physical    April [name] BDZ:[id number] DOB: January 12, 1941 DOA: 07/07/2021  PCP: Einar Pheasant, MD   Patient coming from: Home   Chief Complaint: Fatigue, DOE, dark stools   HPI: April [name] is a pleasant 81 y.o. female with medical history significant for CAD, CKD 3B, PAF on Eliquis, OSA on CPAP, and lymphedema, now presenting to the emergency department with fatigue, exertional dyspnea, melena, and outpatient blood work with decreased hemoglobin and increased creatinine.  Patient reports 2 or 3 weeks dark tarry stools with worsening fatigue and exertional dyspnea.    She took an extra Lasix yesterday and saw her PCP today where she was noted to have positive FOBT and blood work demonstrating decrease in hemoglobin and increased creatinine.    She had a EGD in 2016 with moderate size hiatal hernia, erosive gastropathy, and gastritis.  She had previously been taking PPI but stopped in December for H. pylori testing which was negative, felt that her chronic queasiness had improved off of the PPI, and was going to take an H2 blocker instead, but never started that.  She denies any abdominal pain, nausea, or vomiting.  She denies any chest pain.  ED Course: Upon arrival to the ED, patient is found to be afebrile and saturating well on room air with stable blood pressure.  EKG features sinus bradycardia with first-degree AV nodal block.  Chest x-ray negative for acute findings.  Chemistry panel with BUN 33 and creatinine 1.75.  CBC notable for hemoglobin 8.6.  Type and screen was performed and IV PPI ordered in the ED.  Review of Systems:  All other systems reviewed and apart from HPI, are negative.  Past Medical History:  Diagnosis Date   CAD S/P percutaneous coronary angioplasty    a. 07/2015 MV: No ischemia, EF 66%;  b. 03/2016 Inflat STEMI/PCI: LM nl, LAD 25d, RI nl, LCX nl, OM1/2 nl, RCA 80p (3.5x16 Promus Premier DES), 50p/m, 30d, RPDA 90 (2.5x12 Promu Premier DES); c.  01/2018 Cath ([place], [place]): Patent RCA stents->Med Rx.   CKD (chronic kidney disease), stage III ([place])    Diastolic dysfunction    a. 10/2013 Echo: EF 55-65%, Gr1 DD; b. 01/2018 Echo ([place], [place]): EF 55-60%, Gr2 DD, RVSP 41mHg.   Diverticulitis    Dyspnea    Endometriosis    Family history of adverse reaction to anesthesia    Mother - had to stay in ICU because of breathing difficulties every time she had anesthesia   GERD (gastroesophageal reflux disease)    Hiatal hernia    History of colon polyps 08/1994   History of Migraines    Hypercholesterolemia    Hypertension    LBBB (left bundle branch block)    Osteoporosis    [place]    a. 03/2016 Dx @ time of MI; b. CHA2DS2VASc = 5-->Eliquis.   Rheumatic fever    Sleep apnea    CPAP   Spasmodic dysphonia    ST elevation myocardial infarction (STEMI) of inferolateral wall, [facility] 03/25/2016   Urine incontinence     Past Surgical History:  Procedure Laterality Date   ABDOMINAL HYSTERECTOMY     ovaries not removed   APPENDECTOMY     was removed during hysterectomy   Breast biopsies     x2   BREAST EXCISIONAL BIOPSY Bilateral "years ago"   neg   BREAST SURGERY Bilateral    BROW LIFT  Bilateral 08/15/2019   Procedure: BLEPHAROPLASTY UPPER EYELID; W/EXCESS SKIN BLEPHAROPTOSIS REPAIR; RESECT EX;  Surgeon: Karle Starch, MD;  Location: [facility];  Service: Ophthalmology;  Laterality: Bilateral;  sleep apnea   CARDIAC CATHETERIZATION  2011   moderate 40% RCA disease   CARDIAC CATHETERIZATION  01/2010   Dr. Gollan'@[facility]'$ : Only noted 40% RCA   CARDIAC CATHETERIZATION N/A 03/25/2016   Procedure: Left Heart Cath and Coronary Angiography;  Surgeon: 04/07/2016, MD;  Location: [place] [facility];  Service: Cardiovascular;  Laterality: N/A;   CARDIAC CATHETERIZATION N/A 03/25/2016   Procedure: Coronary Stent Intervention;  Surgeon: 04/07/2016, MD;  Location: [place] [facility];  Service: Cardiovascular;  Laterality: N/A;   COLONOSCOPY  2013   ESOPHAGOGASTRODUODENOSCOPY (EGD) WITH PROPOFOL N/A 03/17/2015   Procedure: ESOPHAGOGASTRODUODENOSCOPY (EGD) WITH PROPOFOL;  Surgeon: 03/30/2015, MD;  Location: [facility];  Service: Gastroenterology;  Laterality: N/A;   NM MYOVIEW ([place] [facility]X)  07/2015   No evidence ischemia or infarction. EF 66%. Low risk   TONSILLECTOMY     TRANSTHORACIC ECHOCARDIOGRAM  10/2013   Normal LV size and function. EF 55-65%. GR 1 DD. Otherwise normal.    Social History:   reports that she has never smoked. She has never used smokeless tobacco. She reports that she does not drink alcohol and does not use drugs.  Allergies  Allergen Reactions   Penicillins Other (See Comments)    Okay to take amoxicillin/(pt does not recall what the reaction to penicillin was (55-84 years old)    Sulfa Antibiotics Rash    Family History  Problem Relation Age of Onset   Arthritis Mother    Stroke Mother    Hypertension Mother    Osteoporosis Mother    Heart disease Father        MI   Heart attack Father    Stroke Brother    Heart attack Sister    Breast cancer Other        first cousin x 2   Stroke Daughter    Heart attack Daughter    Colon cancer Neg Hx      Prior to Admission medications   Medication Sig Start Date End Date Taking? Authorizing Provider  acetaminophen (TYLENOL) 500 MG tablet Take 500 mg by mouth daily as needed for headache (pain).    [provider]  albuterol (PROVENTIL HFA;VENTOLIN HFA) 108 (90 Base) MCG/ACT inhaler Inhale 2 puffs into the lungs every 6 (six) hours as needed for wheezing or shortness of breath. 04/22/18   05/05/18, MD  amiodarone (PACERONE) 200 MG tablet TAKE 1 TABLET BY MOUTH DAILY 06/30/21   16/2/23, MD  aspirin EC 81 MG tablet Take 1 tablet (81 mg total) by mouth daily. 06/03/18   15/3/20, MD  Azelastine HCl 137  MCG/SPRAY SOLN USE 1 TO 2 SPRAYS IN EACH NOSTRIL TWICE A DAY AS NEEDED FOR DRAINAGE 01/20/20   02/02/20, MD  cholecalciferol (VITAMIN D) 1000 units tablet Take 1,000 Units by mouth 2 (two) times daily.    [provider]  ELIQUIS 5 MG TABS tablet TAKE 1 TABLET BY MOUTH TWICE A DAY 05/26/21   06/08/21, MD  fish oil-omega-3 fatty acids 1000 MG capsule Take 1 g by mouth 2 (two) times daily.     [provider]  fluticasone-salmeterol (ADVAIR HFA) 230-21 MCG/ACT inhaler Inhale 2 puffs into the lungs 2 (two) times daily. 10/20/20   11/02/20,  MD  furosemide (LASIX) 40 MG tablet Take 1 tablet (40 mg total) by mouth 2 (two) times daily. Dr. Rockey Situ advised alternate 40 mg twice a day with 40 mg daily every other day 12/08/20   Minna Merritts, MD  hydrALAZINE (APRESOLINE) 10 MG tablet TAKE ONE TABLET BY MOUTH THREE TIMES A DAY 04/15/21   Minna Merritts, MD  losartan (COZAAR) 100 MG tablet TAKE 1 TABLET BY MOUTH DAILY 04/15/21   Minna Merritts, MD  Misc Natural Products (OSTEO BI-FLEX JOINT SHIELD PO) Take 1 tablet by mouth 2 (two) times daily.     [provider]  Multiple Vitamin (MULTIVITAMIN WITH MINERALS) TABS tablet Take 0.5 tablets by mouth 2 (two) times daily.    [provider]  mupirocin ointment (BACTROBAN) 2 % Apply 1 application topically 2 (two) times daily. 06/08/21   McLean-Scocuzza, Nino Glow, MD  nitroGLYCERIN (NITROSTAT) 0.4 MG SL tablet Place 1 tablet (0.4 mg total) under the tongue every 5 (five) minutes as needed. 04/22/18   Minna Merritts, MD  pantoprazole (PROTONIX) 40 MG tablet TAKE 1 TABLET BY MOUTH DAILY 04/15/21   Minna Merritts, MD  Polyethyl Glycol-Propyl Glycol (SYSTANE OP) Place 1 drop into both eyes daily as needed (dry eyes).    [provider]  potassium chloride (KLOR-CON) 10 MEQ tablet TAKE 1 TABLET BY MOUTH TWICE A DAY 06/30/21   Minna Merritts, MD  propranolol (INDERAL) 20 MG tablet TAKE ONE TABLET BY  MOUTH THREE TIMES A DAY 12/08/20   Minna Merritts, MD  rosuvastatin (CRESTOR) 40 MG tablet TAKE 1 TABLET BY MOUTH DAILY 05/25/21   Minna Merritts, MD  sucralfate (CARAFATE) 1 g tablet Take 1 g by mouth 2 (two) times daily. 10/18/20   [provider]  traMADol (ULTRAM) 50 MG tablet Take 1 tablet (50 mg total) by mouth 2 (two) times daily as needed. 04/06/21   Einar Pheasant, MD    Physical Exam: Vitals:   07/07/21 1647 07/07/21 1703 07/07/21 2030  BP:  (!) 139/53 (!) 159/70  Pulse:  (!) 55 (!) 56  Resp:  18   Temp:  98.6 F (37 C)   TempSrc:  Oral   SpO2:  98% 99%  Weight: 88.5 kg    Height: 5' (1.524 m)      Constitutional: NAD, calm  Eyes: PERTLA, lids and conjunctivae normal ENMT: Mucous membranes are moist. Posterior pharynx clear of any exudate or lesions.   Neck: supple, no masses  Respiratory: no wheezing, no crackles. No accessory muscle use.  Cardiovascular: S1 & S2 heard, regular rate and rhythm. No significant JVD. Abdomen: No distension, no tenderness, soft. Bowel sounds active.  Musculoskeletal: no clubbing / cyanosis. No joint deformity upper and lower extremities.   Skin: Lower legs in wraps. Warm, dry, well-perfused. Neurologic: No gross facial asymmetry. Moving all extremities. Alert and oriented.  Psychiatric: Pleasant. Cooperative.    Labs and Imaging on Admission: I have personally reviewed following labs and imaging studies  CBC: Recent Labs  Lab 07/07/21 1334  WBC 7.2  NEUTROABS 3.6  HGB 8.6 Repeated and verified X2.*  HCT 26.1*  MCV 96.8  PLT 973.5   Basic Metabolic Panel: Recent Labs  Lab 07/07/21 1334  NA 139  K 4.6  CL 106  CO2 26  GLUCOSE 108*  BUN 33*  CREATININE 1.75*  CALCIUM 8.6   GFR: Estimated Creatinine Clearance: 24.5 mL/min (A) (by C-G formula based on SCr of 1.75 mg/dL (  H)). Liver Function Tests: Recent Labs  Lab 07/07/21 1334  AST 47*  ALT 26  ALKPHOS 74  BILITOT 0.6  PROT 5.3*  ALBUMIN 3.0*   No  results for input(s): LIPASE, AMYLASE in the last 168 hours. No results for input(s): AMMONIA in the last 168 hours. Coagulation Profile: No results for input(s): INR, PROTIME in the last 168 hours. Cardiac Enzymes: No results for input(s): CKTOTAL, CKMB, CKMBINDEX, TROPONINI in the last 168 hours. BNP (last 3 results) No results for input(s): PROBNP in the last 8760 hours. HbA1C: No results for input(s): HGBA1C in the last 72 hours. CBG: No results for input(s): GLUCAP in the last 168 hours. Lipid Profile: No results for input(s): CHOL, HDL, LDLCALC, TRIG, CHOLHDL, LDLDIRECT in the last 72 hours. Thyroid Function Tests: Recent Labs    07/07/21 1334  TSH 2.21   Anemia Panel: No results for input(s): VITAMINB12, FOLATE, FERRITIN, TIBC, IRON, RETICCTPCT in the last 72 hours. Urine analysis:    Component Value Date/Time   COLORURINE STRAW (A) 07/07/2021 1705   APPEARANCEUR HAZY (A) 07/07/2021 1705   LABSPEC 1.008 07/07/2021 1705   PHURINE 5.0 07/07/2021 1705   GLUCOSEU NEGATIVE 07/07/2021 1705   GLUCOSEU NEGATIVE 12/15/2016 1117   HGBUR SMALL (A) 07/07/2021 1705   BILIRUBINUR NEGATIVE 07/07/2021 1705   KETONESUR NEGATIVE 07/07/2021 1705   PROTEINUR NEGATIVE 07/07/2021 1705   UROBILINOGEN 0.2 12/15/2016 1117   NITRITE NEGATIVE 07/07/2021 1705   LEUKOCYTESUR SMALL (A) 07/07/2021 1705   Sepsis Labs: '@LABRCNTIP'$ (procalcitonin:4,lacticidven:4) )No results found for this or any previous visit (from the past 240 hour(s)).   Radiological Exams on Admission: DG Chest 2 View  Result Date: 07/06/2021 CLINICAL DATA:  Shortness of breath and dyspnea on exertion. EXAM: CHEST - 2 VIEW COMPARISON:  November 11, 2016 FINDINGS: The heart size and mediastinal contours are within normal limits. There is a moderate-sized hiatal hernia. Mild, stable scarring is seen within the left lung base. Both lungs are otherwise clear. Multilevel degenerative changes seen throughout the thoracic spine.  IMPRESSION: 1. Stable left basilar scarring without acute or active cardiopulmonary disease. 2. Moderate-sized hiatal hernia. Electronically Signed   By: Virgina Norfolk M.D.   On: 07/06/2021 20:54    EKG: Independently reviewed. Sinus bradycardia, 1st degree AV block.   Assessment/Plan   1. Upper GI bleeding  - Pt with hiatal hernia and hx of erosive gastropathy on Eliquis for PAF p/w fatigue, DOE, melena, and found to have Hgb 8.6 (12.9 in Sept 2022)  - She was started on IV PPI in ED  - Hold Eliquis, continue IV PPI, trend H&H, consult GI    2. AKI superimposed on CKD IIIb  - SCr is 1.75 on admission, up from a baseline of 1.2 - Likely prerenal in setting of GI blood-loss  - Hold Lasix, start gentle IVF hydration, transfuse RBC if needed, renally-dose medications, monitor   3. PAF  - In sinus rhythm on admission  - Hold Eliquis (last dose am of 3/9), continue amiodarone    4. CAD  - Hx of STEMI in 2017, 2 RCA stents  - No anginal complaints   5. HFpEF  - Appears compensated  - Hold Lasix and gently hydrate with IVF in light of AKI, monitor wt and I/Os    DVT prophylaxis: SCDs, Eliquis held (last dose am of 3/9)  Code Status: Full, discussed with patient on admission  Level of Care: Level of care: Telemetry Medical Family Communication: Son, daughter-in-law, and neighbor updated  at bedside  Disposition Plan:  Patient is from: home  Anticipated d/c is to: Home  Anticipated d/c date is: 07/10/21  Patient currently: pending stable H&H, improved renal function, possible inpatient GI consultation   Consults called: Message sent to GI with request for routine consult   Admission status: Inpatient     Vianne Bulls, MD [facility]  07/07/2021, 8:40 PM

## 2021-07-07 NOTE — Addendum Note (Signed)
Addended by: Leeanne Rio on: 07/07/2021 09:20 AM ? ? Modules accepted: Orders ? ?

## 2021-07-07 NOTE — Assessment & Plan Note (Signed)
Afib. On eliquid.  ?

## 2021-07-08 ENCOUNTER — Encounter: Payer: Self-pay | Admitting: Family Medicine

## 2021-07-08 DIAGNOSIS — K922 Gastrointestinal hemorrhage, unspecified: Secondary | ICD-10-CM | POA: Diagnosis not present

## 2021-07-08 DIAGNOSIS — I25110 Atherosclerotic heart disease of native coronary artery with unstable angina pectoris: Secondary | ICD-10-CM

## 2021-07-08 DIAGNOSIS — D649 Anemia, unspecified: Principal | ICD-10-CM

## 2021-07-08 DIAGNOSIS — G4733 Obstructive sleep apnea (adult) (pediatric): Secondary | ICD-10-CM

## 2021-07-08 LAB — BASIC METABOLIC PANEL
Anion gap: 7 (ref 5–15)
BUN: 37 mg/dL — ABNORMAL HIGH (ref 8–23)
CO2: 24 mmol/L (ref 22–32)
Calcium: 8 mg/dL — ABNORMAL LOW (ref 8.9–10.3)
Chloride: 108 mmol/L (ref 98–111)
Creatinine, Ser: 1.97 mg/dL — ABNORMAL HIGH (ref 0.44–1.00)
GFR, Estimated: 25 mL/min — ABNORMAL LOW (ref >60)
Glucose, Bld: 88 mg/dL (ref 70–99)
Potassium: 4 mmol/L (ref 3.5–5.1)
Sodium: 139 mmol/L (ref 135–145)

## 2021-07-08 LAB — TROPONIN I (HIGH SENSITIVITY)
Troponin I (High Sensitivity): 10 ng/L (ref <18)
Troponin I (High Sensitivity): 17 ng/L (ref <18)

## 2021-07-08 LAB — HEMOGLOBIN AND HEMATOCRIT, BLOOD
HCT: 30.7 % — ABNORMAL LOW (ref 36.0–46.0)
Hemoglobin: 9.9 g/dL — ABNORMAL LOW (ref 12.0–15.0)

## 2021-07-08 LAB — CBC
HCT: 22.6 % — ABNORMAL LOW (ref 36.0–46.0)
Hemoglobin: 7.2 g/dL — ABNORMAL LOW (ref 12.0–15.0)
MCH: 31.3 pg (ref 26.0–34.0)
MCHC: 31.9 g/dL (ref 30.0–36.0)
MCV: 98.3 fL (ref 80.0–100.0)
Platelets: 260 10*3/uL (ref 150–400)
RBC: 2.3 MIL/uL — ABNORMAL LOW (ref 3.87–5.11)
RDW: 14.7 % (ref 11.5–15.5)
WBC: 6.8 10*3/uL (ref 4.0–10.5)
nRBC: 0 % (ref 0.0–0.2)

## 2021-07-08 LAB — ABO/RH: ABO/RH(D): A POS

## 2021-07-08 LAB — PREPARE RBC (CROSSMATCH)

## 2021-07-08 MED ORDER — SODIUM CHLORIDE 0.9 % IV BOLUS
500.0000 mL | Freq: Once | INTRAVENOUS | Status: AC
  Administered 2021-07-08: 500 mL via INTRAVENOUS

## 2021-07-08 MED ORDER — SODIUM CHLORIDE 0.9% IV SOLUTION: Freq: Once | INTRAVENOUS | Status: AC

## 2021-07-08 NOTE — Hospital Course (Addendum)
Taken from H&P. ? ?April [name] is a pleasant 81 y.o. female with medical history significant for CAD, CKD 3B, PAF on Eliquis, OSA on CPAP, and lymphedema, now presenting to the emergency department with fatigue, exertional dyspnea, melena, and outpatient blood work with decreased hemoglobin and increased creatinine.  Patient reports 2 or 3 weeks dark tarry stools with worsening fatigue and exertional dyspnea.  She was noted to have positive FOBT and decrease in hemoglobin at PCP office and advised to come to ED for further evaluation. ? ?She had a EGD in 2016 with moderate size hiatal hernia, erosive gastropathy, and gastritis. ?  ?She had previously been taking PPI but stopped in December for H. pylori testing which was negative, felt that her chronic queasiness had improved off of the PPI, and was going to take an H2 blocker instead, but never started that. ? ?On arrival to ED he was hemodynamically stable, labs with hemoglobin of 8.6 which decreased to 7.2 next morning on 12/08/2021.  Worsening BUN and creatinine. ?She was started on IV Protonix and gastroenterology was consulted. ? ?3/10: Patient continued to feel little dizzy and had melena.  Hemoglobin decreased to 7.2. ?She was hypotensive, ordered 500 cc of normal saline bolus and 2 unit of PRBC. ?No nausea or vomiting.  Most likely EGD today. ?Eliquis is on hold.  Holding antihypertensives. ? ?3/11: Hemoglobin improved to 9.2 after getting 2 unit of PRBC, drifting down again.  Had 1 episode of melena this morning.  GI is recommending 48 hours of Eliquis washout which has been completed now as her last dose was on 07/08/2018 3 in the morning.  Discussed with GI but they have would like to wait for another day and plan for EGD tomorrow. ? ?3/12: Patient did not had any more melena.  Hemoglobin with some improvement to 9.3. ?Patient underwent EGD which shows mildly erythematous gastric mucosa, no obvious bleeding.  Biopsies were taken. ?GI is recommending  colonoscopy tomorrow. ? ?3/13: Patient had her colonoscopy today, no obvious source of bleeding, few C-cell polyps were removed, sent for pathology.  Nonbleeding internal hemorrhoid was also seen.  Some diverticulosis. ?She was given a capsule endoscopy to look for any obvious source of bleeding and small intestine. ?Monitor will be removed after 8 hours, that will be around 10 PM. ?We will plan for discharge tomorrow. ? ?

## 2021-07-08 NOTE — Assessment & Plan Note (Signed)
Most likely with GI bleed.  Creatinine at 1.97 with BUN of 37.  Baseline creatinine 1.1-1.2. ?-Monitor renal function ?-Avoid nephrotoxins ?

## 2021-07-08 NOTE — Assessment & Plan Note (Signed)
Blood pressure currently soft.  She was on hydralazine, Lasix and losartan at home. ?-Holding home antihypertensives. ?-Give 500 cc normal saline bolus ?-Monitor blood pressure ?

## 2021-07-08 NOTE — Assessment & Plan Note (Signed)
Significant lower extremity edema, per patient this is chronic and she uses leg wraps and Lasix. ?-Monitor for any respiratory distress as patient is being given some fluid and blood. ?-Can use Lasix as needed-also depending on her blood pressure ?

## 2021-07-08 NOTE — Assessment & Plan Note (Addendum)
History of STEMI in 2017, s/p PCI involving RCA. ?No chest pain.  No troponin was checked during this admission. ?-Check troponin ?-Holding home aspirin ?-We will continue Crestor after EGD as she is n.p.o. now ?

## 2021-07-08 NOTE — TOC CM/SW Note (Signed)
?  Transition of Care (TOC) Screening Note ? ? ?Patient Details  ?Name: April [name] ?Date of Birth: Dec 09, 1940 ? ? ?Transition of Care (TOC) CM/SW Contact:    ?Sailor Hevia E Yu Peggs, LCSW ?Phone Number: ?07/08/2021, 12:23 PM ? ? ? ?Transition of Care Department [facility]) has reviewed patient and no TOC needs have been identified at this time. We will continue to monitor patient advancement through interdisciplinary progression rounds. If new patient transition needs arise, please place a TOC consult. ? ? ?

## 2021-07-08 NOTE — Consult Note (Signed)
April [name] , MD [address], [name], [name], [place], [id number], [name], [name], [place], [address] Phone: [phone]  Fax: [phone]  Consultation  Referring Provider:    Dr April [name] Primary Care Physician:  April Pheasant, MD Primary Gastroenterologist:  April [name] gastro         Reason for Consultation:     gi bleed  Date of Admission:  07/07/2021 Date of Consultation:  07/08/2021         HPI:   April [name] is a 81 y.o. female is a patient of [name] gastro[facility]ology and sees Dr. Virgina [name].  Evaluated in December 2022 for abnormal LFTs and H. pylori infection.   Last EGD was in 2016 medium sized hiatal hernia was noted.  Otherwise no gross abnormalities. She presented to the emergency room yesterday with fatigue dyspnea on exertion and dark stools.  She has a history of CAD, CKD on Eliquis.  5 months back her hemoglobin was 12.9 g on admission was 8.6 g with an MCV of 96. On admission creatinine was 1.19 and this morning it is 1.97 with a BUN of 37.  She says that she has had reflux in the past and had been on a PPI that was stopped by her PCP sometime back and presently has not been taking any.   She has had black colored stools for a few weeks, denies any heartburn or nsaid use . No abdominal pain. .   Past Medical History:  Diagnosis Date   CAD S/P percutaneous coronary angioplasty    a. 07/2015 MV: No ischemia, EF 66%;  b. 03/2016 [place] STEMI/PCI: LM nl, LAD 25d, RI nl, LCX nl, OM1/2 nl, RCA 80p (3.5x16 Promus Premier DES), 50p/m, 30d, RPDA 90 (2.5x12 Promu Premier DES); c. 01/2018 Cath ([name], [place]): Patent RCA stents->Med Rx.   CKD (chronic kidney disease), stage III ([name])    Diastolic dysfunction    a. 10/2013 Echo: EF 55-65%, Gr1 DD; b. 01/2018 Echo ([place], [place]): EF 55-60%, Gr2 DD, RVSP 19mHg.   Diverticulitis    Dyspnea    Endometriosis    Family history of adverse reaction to anesthesia    Mother - had to  stay in ICU because of breathing difficulties every time she had anesthesia   GERD (gastroesophageal reflux disease)    Hiatal hernia    History of colon polyps 08/1994   History of Migraines    Hypercholesterolemia    Hypertension    LBBB (left bundle branch block)    Osteoporosis    PAF (paroxysmal atrial fibrillation) ([facility]    a. 03/2016 Dx @ time of MI; b. CHA2DS2VASc = 5-->Eliquis.   Rheumatic fever    Sleep apnea    CPAP   Spasmodic dysphonia    ST elevation myocardial infarction (STEMI) of inferolateral wall, initial episode of care ([name] 03/25/2016   Urine incontinence     Past Surgical History:  Procedure Laterality Date   ABDOMINAL HYSTERECTOMY     ovaries not removed   APPENDECTOMY     was removed during hysterectomy   Breast biopsies     x2   BREAST EXCISIONAL BIOPSY Bilateral "years ago"   neg   BREAST SURGERY Bilateral    BROW LIFT Bilateral 08/15/2019   Procedure: BLEPHAROPLASTY UPPER EYELID; W/EXCESS SKIN BLEPHAROPTOSIS REPAIR; RESECT EX;  Surgeon: FKarle Starch MD;  [facility]  Service: Ophthalmology;  Laterality: Bilateral;  sleep apnea  CARDIAC CATHETERIZATION  2011   moderate 40% RCA disease   CARDIAC CATHETERIZATION  01/2010   Dr. Gollan'@[place]'$ : Only noted 40% RCA   CARDIAC CATHETERIZATION N/A 03/25/2016   Procedure: Left Heart Cath and Coronary Angiography;  Surgeon: 04/07/2016, MD;  Location: [place] [facility];  Service: Cardiovascular;  Laterality: N/A;   CARDIAC CATHETERIZATION N/A 03/25/2016   Procedure: Coronary Stent Intervention;  Surgeon: 04/07/2016, MD;  Location: [facility];  Service: Cardiovascular;  Laterality: N/A;   COLONOSCOPY  2013   ESOPHAGOGASTRODUODENOSCOPY (EGD) WITH PROPOFOL N/A 03/17/2015   Procedure: ESOPHAGOGASTRODUODENOSCOPY (EGD) WITH PROPOFOL;  Surgeon: 03/30/2015, MD;  Location: [name];  Service: Gastroenterology;  Laterality: N/A;   NM MYOVIEW ([place] [facility])  07/2015   No  evidence ischemia or infarction. EF 66%. Low risk   TONSILLECTOMY     TRANSTHORACIC ECHOCARDIOGRAM  10/2013   Normal LV size and function. EF 55-65%. GR 1 DD. Otherwise normal.    Prior to Admission medications   Medication Sig Start Date End Date Taking? Authorizing Provider  acetaminophen (TYLENOL) 500 MG tablet Take 500 mg by mouth daily as needed for headache (pain).   Yes [provider]  albuterol (PROVENTIL HFA;VENTOLIN HFA) 108 (90 Base) MCG/ACT inhaler Inhale 2 puffs into the lungs every 6 (six) hours as needed for wheezing or shortness of breath. 04/22/18  Yes [name], 05/05/18, MD  amiodarone (PACERONE) 200 MG tablet TAKE 1 TABLET BY MOUTH DAILY 06/30/21  Yes [name], 16/2/23, MD  aspirin EC 81 MG tablet Take 1 tablet (81 mg total) by mouth daily. 06/03/18  Yes [name], 15/3/20, MD  Azelastine HCl 137 MCG/SPRAY SOLN USE 1 TO 2 SPRAYS IN EACH NOSTRIL TWICE A DAY AS NEEDED FOR DRAINAGE 01/20/20  Yes 02/02/20, MD  cholecalciferol (VITAMIN D) 1000 units tablet Take 1,000 Units by mouth 2 (two) times daily.   Yes [provider]  ELIQUIS 5 MG TABS tablet TAKE 1 TABLET BY MOUTH TWICE A DAY 05/26/21  Yes 06/08/21, MD  fish oil-omega-3 fatty acids 1000 MG capsule Take 1 g by mouth 2 (two) times daily.    Yes [provider]  fluticasone-salmeterol (ADVAIR HFA) 230-21 MCG/ACT inhaler Inhale 2 puffs into the lungs 2 (two) times daily. 10/20/20  Yes 11/02/20, MD  furosemide (LASIX) 40 MG tablet Take 1 tablet (40 mg total) by mouth 2 (two) times daily. Dr. Flora [name] advised alternate 40 mg twice a day with 40 mg daily every other day 12/08/20  Yes [name], 21/10/22, MD  hydrALAZINE (APRESOLINE) 10 MG tablet TAKE ONE TABLET BY MOUTH THREE TIMES A DAY 04/15/21  Yes [name], 04/28/21, MD  losartan (COZAAR) 100 MG tablet TAKE 1 TABLET BY MOUTH DAILY 04/15/21  Yes [name], 04/28/21, MD  Misc Natural Products (OSTEO BI-FLEX JOINT SHIELD PO) Take 1 tablet by mouth 2 (two)  times daily.    Yes [provider]  Multiple Vitamin (MULTIVITAMIN WITH MINERALS) TABS tablet Take 0.5 tablets by mouth 2 (two) times daily.   Yes [provider]  Polyethyl Glycol-Propyl Glycol (SYSTANE OP) Place 1 drop into both eyes daily as needed (dry eyes).   Yes [provider]  potassium chloride (KLOR-CON) 10 MEQ tablet TAKE 1 TABLET BY MOUTH TWICE A DAY 06/30/21  Yes [name], 16/2/23, MD  propranolol (INDERAL) 20 MG tablet TAKE ONE TABLET BY MOUTH THREE TIMES A DAY 12/08/20  Yes [name], 21/10/22, MD  rosuvastatin (  CRESTOR) 40 MG tablet TAKE 1 TABLET BY MOUTH DAILY 05/25/21  Yes [name], Kathlene November, MD  sucralfate (CARAFATE) 1 g tablet Take 1 g by mouth 2 (two) times daily. 10/18/20  Yes [provider]  mupirocin ointment (BACTROBAN) 2 % Apply 1 application topically 2 (two) times daily. Patient not taking: Reported on 07/07/2021 06/08/21   McLean-Scocuzza, Nino Glow, MD  nitroGLYCERIN (NITROSTAT) 0.4 MG SL tablet Place 1 tablet (0.4 mg total) under the tongue every 5 (five) minutes as needed. 04/22/18   Minna Merritts, MD  pantoprazole (PROTONIX) 40 MG tablet TAKE 1 TABLET BY MOUTH DAILY Patient not taking: Reported on 07/07/2021 04/15/21   Minna Merritts, MD  traMADol (ULTRAM) 50 MG tablet Take 1 tablet (50 mg total) by mouth 2 (two) times daily as needed. Patient not taking: Reported on 07/07/2021 04/06/21   April Pheasant, MD    Family History  Problem Relation Age of Onset   Arthritis Mother    Stroke Mother    Hypertension Mother    Osteoporosis Mother    Heart disease Father        MI   Heart attack Father    Stroke Brother    Heart attack Sister    Breast cancer Other        first cousin x 2   Stroke Daughter    Heart attack Daughter    Colon cancer Neg Hx      Social History   Tobacco Use   Smoking status: Never   Smokeless tobacco: Never  Vaping Use   Vaping Use: Never used  Substance Use Topics   Alcohol use: No     Alcohol/week: 0.0 standard drinks   Drug use: No    Allergies as of 07/07/2021 - Review Complete 07/07/2021  Allergen Reaction Noted   Penicillins Other (See Comments) 10/11/2012   Sulfa antibiotics Rash 07/08/2012    Review of Systems:    All systems reviewed and negative except where noted in HPI.   Physical Exam:  Vital signs in last 24 hours: Temp:  [98.1 F (36.7 C)-98.6 F (37 C)] 98.2 F (36.8 C) (03/10 0546) Pulse Rate:  [53-59] 58 (03/10 0546) Resp:  [14-19] 19 (03/10 0546) BP: (86-173)/(22-70) 86/22 (03/10 0546) SpO2:  [98 %-100 %] 100 % (03/10 0546) Weight:  [88.5 kg] 88.5 kg (03/09 1647)   General:   Pleasant, cooperative in NAD Head:  Normocephalic and atraumatic. Eyes:   No icterus.   Conjunctiva pink. PERRLA. Ears:  Normal auditory acuity. Neck:  Supple; no masses or thyroidomegaly Lungs: Respirations even and unlabored. Lungs clear to auscultation bilaterally.   No wheezes, crackles, or rhonchi.  Heart:  Regular rate and rhythm;  Without murmur, clicks, rubs or gallops Abdomen:  Soft, nondistended, nontender. Normal bowel sounds. No appreciable masses or hepatomegaly.  No rebound or guarding.  Neurologic:  Alert and oriented x3;  grossly normal neurologically. Skin:  Intact without significant lesions or rashes. Cervical Nodes:  No significant cervical adenopathy. Psych:  Alert and cooperative. Normal affect.  LAB RESULTS: Recent Labs    07/07/21 1334 07/08/21 0503  WBC 7.2 6.8  HGB 8.6 Repeated and verified X2.* 7.2*  HCT 26.1* 22.6*  PLT 286.0 260   BMET Recent Labs    07/07/21 1334 07/08/21 0503  NA 139 139  K 4.6 4.0  CL 106 108  CO2 26 24  GLUCOSE 108* 88  BUN 33* 37*  CREATININE 1.75* 1.97*  CALCIUM 8.6 8.0*  LFT Recent Labs    07/07/21 1334  PROT 5.3*  ALBUMIN 3.0*  AST 47*  ALT 26  ALKPHOS 74  BILITOT 0.6  BILIDIR 0.2   PT/INR No results for input(s): LABPROT, INR in the last 72 hours.  STUDIES: DG Chest 2  View  Result Date: 07/06/2021 CLINICAL DATA:  Shortness of breath and dyspnea on exertion. EXAM: CHEST - 2 VIEW COMPARISON:  November 11, 2016 FINDINGS: The heart size and mediastinal contours are within normal limits. There is a moderate-sized hiatal hernia. Mild, stable scarring is seen within the left lung base. Both lungs are otherwise clear. Multilevel degenerative changes seen throughout the thoracic spine. IMPRESSION: 1. Stable left basilar scarring without acute or active cardiopulmonary disease. 2. Moderate-sized hiatal hernia. Electronically Signed   By: April Norfolk M.D.   On: 07/06/2021 20:54      Impression / Plan:   April [name] is a 81 y.o. y/o female with a history of CAD, CKD on Eliquis presents the emergency room with dyspnea, melena for the past few days.  Hemoglobin down to 7 g from a baseline of 12 g.  Very likely has had an upper GI bleed exacerbated by Eliquis.Likely cause is reflux related esophagitis.   Plan 1.  2 IV large-bore IV cannulas 2.  Monitor CBC and transfuse as needed 3.  IV PPI 4.  Eliquis has been held since admission 5.  EGD when off Eliquis for 48 hours which will be Sunday which we will place her on schedule   In the interim if has a significant GI bleed then would need a CT angiogram and if positive vascular surgery intervention. 6.  As an outpatient confirmation of H. pylori would need to performed as it cannot be done due to possibility of false negative test in the setting of an acute GI bleed   I have discussed alternative options, risks & benefits,  which include, but are not limited to, bleeding, infection, perforation,respiratory complication & drug reaction.  The patient agrees with this plan & written consent will be obtained.     Thank you for involving me in the care of this patient.      LOS: 1 day   April Bellows, MD  07/08/2021, 8:13 AM

## 2021-07-08 NOTE — Assessment & Plan Note (Signed)
Currently in sinus rhythm. ?-Continue amiodarone ?-Keep holding Eliquis-last dose on 07/07/2021 AM ?

## 2021-07-08 NOTE — Assessment & Plan Note (Addendum)
Patient with concern of upper GI bleed with melena.  She was on Eliquis for paroxysmal atrial fibrillation.  Hemoglobin decreased to 7.2 this morning. ?GI was consulted-will appreciate their help ?-Give her 2 unit PRBC ?-Monitor hemoglobin ?-Keep holding Eliquis-last dose was on 07/07/2021 in the morning ?

## 2021-07-08 NOTE — Assessment & Plan Note (Signed)
-   Continue with CPAP at night 

## 2021-07-08 NOTE — Progress Notes (Signed)
?Progress Note ? ? ?Patient: April [name] DOB: 31-Aug-1940 DOA: 07/07/2021     1 ?DOS: the patient was seen and examined on 07/08/2021 ?  ?Brief hospital course: ?Taken from H&P. ? ?April [name] is a pleasant 81 y.o. female with medical history significant for CAD, CKD 3B, PAF on Eliquis, OSA on CPAP, and lymphedema, now presenting to the emergency department with fatigue, exertional dyspnea, melena, and outpatient blood work with decreased hemoglobin and increased creatinine.  Patient reports 2 or 3 weeks dark tarry stools with worsening fatigue and exertional dyspnea.  She was noted to have positive FOBT and decrease in hemoglobin at PCP office and advised to come to ED for further evaluation. ? ?She had a EGD in 2016 with moderate size hiatal hernia, erosive gastropathy, and gastritis. ?  ?She had previously been taking PPI but stopped in December for H. pylori testing which was negative, felt that her chronic queasiness had improved off of the PPI, and was going to take an H2 blocker instead, but never started that. ? ?On arrival to ED he was hemodynamically stable, labs with hemoglobin of 8.6 which decreased to 7.2 next morning on 12/08/2021.  Worsening BUN and creatinine. ?She was started on IV Protonix and gastroenterology was consulted. ? ?3/10: Patient continued to feel little dizzy and had melena.  Hemoglobin decreased to 7.2. ?She was hypotensive, ordered 500 cc of normal saline bolus and 2 unit of PRBC. ?No nausea or vomiting.  Most likely EGD today. ?Eliquis is on hold.  Holding antihypertensives. ? ? ?Assessment and Plan: ?* GI bleeding ?Patient with concern of upper GI bleed with melena.  She was on Eliquis for paroxysmal atrial fibrillation.  Hemoglobin decreased to 7.2 this morning. ?GI was consulted-will appreciate their help ?-Give her 2 unit PRBC ?-Monitor hemoglobin ?-Keep holding Eliquis-last dose was on 07/07/2021 in the morning ? ?Paroxysmal atrial fibrillation (HCC) ?Currently  in sinus rhythm. ?-Continue amiodarone ?-Keep holding Eliquis-last dose on 07/07/2021 AM ? ?Essential hypertension, benign ?Blood pressure currently soft.  She was on hydralazine, Lasix and losartan at home. ?-Holding home antihypertensives. ?-Give 500 cc normal saline bolus ?-Monitor blood pressure ? ?Chronic heart failure with preserved ejection fraction ([place]) ?Significant lower extremity edema, per patient this is chronic and she uses leg wraps and Lasix. ?-Monitor for any respiratory distress as patient is being given some fluid and blood. ?-Can use Lasix as needed-also depending on her blood pressure ? ?Acute renal failure superimposed on stage 3b chronic kidney disease ([place]) ?Most likely with GI bleed.  Creatinine at 1.97 with BUN of 37.  Baseline creatinine 1.1-1.2. ?-Monitor renal function ?-Avoid nephrotoxins ? ?Coronary artery disease involving native coronary artery of native heart with unstable angina pectoris ([place]) ?History of STEMI in 2017, s/p PCI involving RCA. ?No chest pain.  No troponin was checked during this admission. ?-Check troponin ?-Holding home aspirin ?-We will continue Crestor after EGD as she is n.p.o. now ? ?Obstructive sleep apnea ?- Continue with CPAP at night ? ? ?Subjective: Patient was feeling little weak when seen today.  Son and daughter-in-law at bedside.  She had 1 dark bowel movement this morning.  Denies any nausea, vomiting.  Stating that her shortness of breath is very chronic and there is no change.  No chest pain. ? ?Physical Exam: ?Vitals:  ? 07/08/21 0546 07/08/21 0814 07/08/21 0826 07/08/21 1046  ?BP: (!) 86/22 (!) 102/36 (!) 106/38 (!) 110/41  ?Pulse: (!) 58 (!) 58  (!) 55  ?Resp:  $'19 16  16  'Z$ ?Temp: 98.2 ?F (36.8 ?C) 98.2 ?F (36.8 ?C)  98.1 ?F (36.7 ?C)  ?TempSrc:    Oral  ?SpO2: 100% 97%  99%  ?Weight:      ?Height:      ? ?General.  Well-developed, obese elderly lady, in no acute distress. ?Pulmonary.  Lungs clear bilaterally, normal respiratory effort. ?CV.  Regular  rate and rhythm, no JVD, rub or murmur. ?Abdomen.  Soft, nontender, nondistended, BS positive. ?CNS.  Alert and oriented .  No focal neurologic deficit. ?Extremities.  2+ LE edema, no cyanosis, pulses intact and symmetrical.  Bilateral Ace wrap. ?Psychiatry.  Judgment and insight appears normal. ? ?Data Reviewed: ?I personally reviewed prior notes, labs and imaging. ? ?Family Communication: Son and daughter-in-law at bedside. ? ?Disposition: ?Status is: Inpatient ?Remains inpatient appropriate because: Severity of illness ? ? Planned Discharge Destination: Home ? ?DVT prophylaxis.  SCDs, GI bleed ? ?Time spent: 50 minutes ? ?This record has been created using Systems analyst. Errors have been sought and corrected,but may not always be located. Such creation errors do not reflect on the standard of care. ? ?Author: ?Lorella Nimrod, MD ?07/08/2021 11:17 AM ? ?For on call review [email or web address].  ?

## 2021-07-09 DIAGNOSIS — K921 Melena: Secondary | ICD-10-CM

## 2021-07-09 DIAGNOSIS — D649 Anemia, unspecified: Secondary | ICD-10-CM

## 2021-07-09 LAB — BPAM RBC
Blood Product Expiration Date: 202303132359
Blood Product Expiration Date: 202303222359
ISSUE DATE / TIME: 202303101056
ISSUE DATE / TIME: 202303101352
Unit Type and Rh: 600
Unit Type and Rh: 6200

## 2021-07-09 LAB — BASIC METABOLIC PANEL
Anion gap: 5 (ref 5–15)
BUN: 29 mg/dL — ABNORMAL HIGH (ref 8–23)
CO2: 25 mmol/L (ref 22–32)
Calcium: 8.2 mg/dL — ABNORMAL LOW (ref 8.9–10.3)
Chloride: 110 mmol/L (ref 98–111)
Creatinine, Ser: 1.87 mg/dL — ABNORMAL HIGH (ref 0.44–1.00)
GFR, Estimated: 27 mL/min — ABNORMAL LOW (ref >60)
Glucose, Bld: 102 mg/dL — ABNORMAL HIGH (ref 70–99)
Potassium: 5 mmol/L (ref 3.5–5.1)
Sodium: 140 mmol/L (ref 135–145)

## 2021-07-09 LAB — TYPE AND SCREEN
ABO/RH(D): A POS
Antibody Screen: NEGATIVE
Unit division: 0
Unit division: 0

## 2021-07-09 LAB — IRON AND TIBC
Iron: 241 ug/dL — ABNORMAL HIGH (ref 28–170)
Saturation Ratios: 74 % — ABNORMAL HIGH (ref 10.4–31.8)
TIBC: 328 ug/dL (ref 250–450)
UIBC: 87 ug/dL

## 2021-07-09 LAB — CBC
HCT: 26.9 % — ABNORMAL LOW (ref 36.0–46.0)
Hemoglobin: 8.8 g/dL — ABNORMAL LOW (ref 12.0–15.0)
MCH: 30.9 pg (ref 26.0–34.0)
MCHC: 32.7 g/dL (ref 30.0–36.0)
MCV: 94.4 fL (ref 80.0–100.0)
Platelets: 245 10*3/uL (ref 150–400)
RBC: 2.85 MIL/uL — ABNORMAL LOW (ref 3.87–5.11)
RDW: 17.3 % — ABNORMAL HIGH (ref 11.5–15.5)
WBC: 6.9 10*3/uL (ref 4.0–10.5)
nRBC: 0 % (ref 0.0–0.2)

## 2021-07-09 LAB — FOLATE: Folate: 22 ng/mL (ref >5.9)

## 2021-07-09 LAB — FERRITIN: Ferritin: 51 ng/mL (ref 11–307)

## 2021-07-09 MED ORDER — SODIUM CHLORIDE 0.9 % IV SOLN: INTRAVENOUS | Status: DC

## 2021-07-09 NOTE — Assessment & Plan Note (Signed)
Blood pressure remained soft.  She was on hydralazine, Lasix and losartan at home. ?-Holding home antihypertensives. ?-Monitor blood pressure ?

## 2021-07-09 NOTE — Assessment & Plan Note (Signed)
Patient with concern of upper GI bleed with melena.  She was on Eliquis for paroxysmal atrial fibrillation.  Continue to have some melena, per patient it started clearing up.  Hemoglobin 9.2 after getting 2 unit PRBC, not drifting down to 8.8 this morning. ?-GI wants to have 48 hours of Eliquis washout which has been completed this morning but they would like to wait for another day and EGD tomorrow ?-Monitor hemoglobin ?-Keep holding Eliquis-last dose was on 07/07/2021 in the morning ?

## 2021-07-09 NOTE — Progress Notes (Signed)
?Progress Note ? ? ?Patient: April [name] IRC:[id number] DOB: Feb 16, 1941 DOA: 07/07/2021     2 ?DOS: the patient was seen and examined on 07/09/2021 ?  ?Brief hospital course: ?Taken from H&P. ? ?April [name] is a pleasant 81 y.o. female with medical history significant for CAD, CKD 3B, PAF on Eliquis, OSA on CPAP, and lymphedema, now presenting to the emergency department with fatigue, exertional dyspnea, melena, and outpatient blood work with decreased hemoglobin and increased creatinine.  Patient reports 2 or 3 weeks dark tarry stools with worsening fatigue and exertional dyspnea.  She was noted to have positive FOBT and decrease in hemoglobin at PCP office and advised to come to ED for further evaluation. ? ?She had a EGD in 2016 with moderate size hiatal hernia, erosive gastropathy, and gastritis. ?  ?She had previously been taking PPI but stopped in December for H. pylori testing which was negative, felt that her chronic queasiness had improved off of the PPI, and was going to take an H2 blocker instead, but never started that. ? ?On arrival to ED he was hemodynamically stable, labs with hemoglobin of 8.6 which decreased to 7.2 next morning on 12/08/2021.  Worsening BUN and creatinine. ?She was started on IV Protonix and gastroenterology was consulted. ? ?3/10: Patient continued to feel little dizzy and had melena.  Hemoglobin decreased to 7.2. ?She was hypotensive, ordered 500 cc of normal saline bolus and 2 unit of PRBC. ?No nausea or vomiting.  Most likely EGD today. ?Eliquis is on hold.  Holding antihypertensives. ? ?3/11: Hemoglobin improved to 9.2 after getting 2 unit of PRBC, drifting down again.  Had 1 episode of melena this morning.  GI is recommending 48 hours of Eliquis washout which has been completed now as her last dose was on 07/08/2018 3 in the morning.  Discussed with GI but they have would like to wait for another day and plan for EGD tomorrow. ? ? ?Assessment and Plan: ?* GI bleeding ?Patient  with concern of upper GI bleed with melena.  She was on Eliquis for paroxysmal atrial fibrillation.  Continue to have some melena, per patient it started clearing up.  Hemoglobin 9.2 after getting 2 unit PRBC, not drifting down to 8.8 this morning. ?-GI wants to have 48 hours of Eliquis washout which has been completed this morning but they would like to wait for another day and EGD tomorrow ?-Monitor hemoglobin ?-Keep holding Eliquis-last dose was on 07/07/2021 in the morning ? ?Paroxysmal atrial fibrillation (HCC) ?Currently in sinus rhythm. ?-Continue amiodarone ?-Keep holding Eliquis-last dose on 07/07/2021 AM ? ?Essential hypertension, benign ?Blood pressure remained soft.  She was on hydralazine, Lasix and losartan at home. ?-Holding home antihypertensives. ?-Monitor blood pressure ? ?Chronic heart failure with preserved ejection fraction ([place]) ?Significant lower extremity edema, per patient this is chronic and she uses leg wraps and Lasix. ?-Monitor for any respiratory distress as patient is being given some fluid and blood. ?-Can use Lasix as needed-also depending on her blood pressure, currently holding home dose ? ?Acute renal failure superimposed on stage 3b chronic kidney disease ([place]) ?Most likely with GI bleed.  Creatinine with mild improvement at 1.87 with BUN of 29 today.  Baseline creatinine 1.1-1.2. ?-Monitor renal function ?-Avoid nephrotoxins ? ?Coronary artery disease involving native coronary artery of native heart with unstable angina pectoris ([place]) ?History of STEMI in 2017, s/p PCI involving RCA. ?No chest pain.  No troponin was checked during this admission. ?-Check troponin ?-Holding home aspirin ?-We  will continue Crestor after EGD as she is n.p.o. now ? ?Obstructive sleep apnea ?- Continue with CPAP at night ? ? ?Subjective: Patient was seen and examined today.  No new complaints.  She had a 1 small melanotic bowel movement this morning, stating that it seems that it started clearing up as  it is less black than before.  No abdominal pain.  No chest pain or shortness of breath ? ?Physical Exam: ?Vitals:  ? 07/08/21 2040 07/09/21 0500 07/09/21 0514 07/09/21 0743  ?BP: (!) 129/58  (!) 105/43 (!) 103/43  ?Pulse: (!) 56  (!) 59 (!) 58  ?Resp: '18  18 18  '$ ?Temp: 98.4 ?F (36.9 ?C)  98.3 ?F (36.8 ?C) 98.3 ?F (36.8 ?C)  ?TempSrc: Oral  Oral Oral  ?SpO2: 97%  98% 99%  ?Weight:  91.8 kg    ?Height:      ? ?General.  Well-developed, obese elderly lady, in no acute distress. ?Pulmonary.  Lungs clear bilaterally, normal respiratory effort. ?CV.  Regular rate and rhythm, no JVD, rub or murmur. ?Abdomen.  Soft, nontender, nondistended, BS positive. ?CNS.  Alert and oriented .  No focal neurologic deficit. ?Extremities.  2+ LE edema, no cyanosis, pulses intact and symmetrical.  Wrap in place. ?Psychiatry.  Judgment and insight appears normal. ? ?Data Reviewed: ?Prior notes and labs reviewed. ? ?Family Communication: Discussed with patient ? ?Disposition: ?Status is: Inpatient ?Remains inpatient appropriate because: Severity of illness ? ? Planned Discharge Destination: Home ? ?DVT prophylaxis.  SCDs.  Patient with GI bleed ? ?Time spent: 45 minutes ? ?This record has been created using Systems analyst. Errors have been sought and corrected,but may not always be located. Such creation errors do not reflect on the standard of care. ? ?Author: ?Lorella Nimrod, MD ?07/09/2021 1:50 PM ? ?For on call review [email or web address].  ?

## 2021-07-09 NOTE — Progress Notes (Signed)
April Darby, MD [address]  [place]  [place],  [address]  Main: [phone]  Fax: [phone] Pager: [phone]   Subjective: No acute events overnight.  Patient had a small bowel movement yesterday.  She is ready for upper endoscopy.  Her hemoglobin dropped from 9.9 to 8.8 today.  Patient does not have any GI symptoms.   Objective: Vital signs in last 24 hours: Vitals:   07/09/21 0500 07/09/21 0514 07/09/21 0743 07/09/21 1620  BP:  (!) 105/43 (!) 103/43 (!) 112/50  Pulse:  (!) 59 (!) 58 (!) 59  Resp:  '18 18 17  '$ Temp:  98.3 F (36.8 C) 98.3 F (36.8 C) 98.3 F (36.8 C)  TempSrc:  Oral Oral Oral  SpO2:  98% 99% 97%  Weight: 91.8 kg     Height:       Weight change: 3.349 kg  Intake/Output Summary (Last 24 hours) at 07/09/2021 1634 Last data filed at 07/09/2021 1500 Gross per 24 hour  Intake 799.05 ml  Output --  Net 799.05 ml     Exam: Heart:: Regular rate and rhythm, S1S2 present, or without murmur or extra heart sounds Lungs: normal and clear to auscultation Abdomen: soft, nontender, normal bowel sounds   Lab Results: CBC Latest Ref Rng & Units 07/09/2021 07/08/2021 07/08/2021  WBC 4.0 - 10.5 K/uL 6.9 - 6.8  Hemoglobin 12.0 - 15.0 g/dL 8.8(L) 9.9(L) 7.2(L)  Hematocrit 36.0 - 46.0 % 26.9(L) 30.7(L) 22.6(L)  Platelets 150 - 400 K/uL 245 - 260   CMP Latest Ref Rng & Units 07/09/2021 07/08/2021 07/07/2021  Glucose 70 - 99 mg/dL 102(H) 88 108(H)  BUN 8 - 23 mg/dL 29(H) 37(H) 33(H)  Creatinine 0.44 - 1.00 mg/dL 1.87(H) 1.97(H) 1.75(H)  Sodium 135 - 145 mmol/L 140 139 139  Potassium 3.5 - 5.1 mmol/L 5.0 4.0 4.6  Chloride 98 - 111 mmol/L 110 108 106  CO2 22 - 32 mmol/L '25 24 26  '$ Calcium 8.9 - 10.3 mg/dL 8.2(L) 8.0(L) 8.6  Total Protein 6.0 - 8.3 g/dL - - 5.3(L)  Total Bilirubin 0.2 - 1.2 mg/dL - - 0.6  Alkaline Phos 39 - 117 U/L - - 74  AST 0 - 37 U/L - - 47(H)  ALT 0 - 35 U/L - - 26    Micro Results: Recent Results (from the past 240  hour(s))  Resp Panel by RT-PCR (Flu A&B, Covid) Nasopharyngeal Swab     Status: None   Collection Time: 07/07/21  8:01 PM   Specimen: Nasopharyngeal Swab; Nasopharyngeal(NP) swabs in vial transport medium  Result Value Ref Range Status   SARS Coronavirus 2 by RT PCR NEGATIVE NEGATIVE Final    Comment: (NOTE) SARS-CoV-2 target nucleic acids are NOT DETECTED.  The SARS-CoV-2 RNA is generally detectable in upper respiratory specimens during the acute phase of infection. The lowest concentration of SARS-CoV-2 viral copies this assay can detect is 138 copies/mL. A negative result does not preclude SARS-Cov-2 infection and should not be used as the sole basis for treatment or other patient management decisions. A negative result may occur with  improper specimen collection/handling, submission of specimen other than nasopharyngeal swab, presence of viral mutation(s) within the areas targeted by this assay, and inadequate number of viral copies(<138 copies/mL). A negative result must be combined with clinical observations, patient history, and epidemiological information. The expected result is Negative.  Fact Sheet for Patients:  EntrepreneurPulse.com.au  Fact Sheet for Healthcare Providers:  IncredibleEmployment.be  This test is  no t yet approved or cleared by the [place] and  has been authorized for detection and/or diagnosis of SARS-CoV-2 by FDA under an Emergency Use Authorization (EUA). This EUA will remain  in effect (meaning this test can be used) for the duration of the COVID-19 declaration under Section 564(b)(1) of the Act, 21 U.S.C.section 360bbb-3(b)(1), unless the authorization is terminated  or revoked sooner.       Influenza A by PCR NEGATIVE NEGATIVE Final   Influenza B by PCR NEGATIVE NEGATIVE Final    Comment: (NOTE) The Xpert Xpress SARS-CoV-2/FLU/RSV plus assay is intended as an aid in the diagnosis of influenza from  Nasopharyngeal swab specimens and should not be used as a sole basis for treatment. Nasal washings and aspirates are unacceptable for Xpert Xpress SARS-CoV-2/FLU/RSV testing.  Fact Sheet for Patients: EntrepreneurPulse.com.au  Fact Sheet for Healthcare Providers: IncredibleEmployment.be  This test is not yet approved or cleared by the [place] FDA and has been authorized for detection and/or diagnosis of SARS-CoV-2 by FDA under an Emergency Use Authorization (EUA). This EUA will remain in effect (meaning this test can be used) for the duration of the COVID-19 declaration under Section 564(b)(1) of the Act, 21 U.S.C. section 360bbb-3(b)(1), unless the authorization is terminated or revoked.  Performed at [facility], [address], [place], [place] [address]    Studies/Results: No results found. Medications: I have reviewed the patient's current medications. Prior to Admission:  Medications Prior to Admission  Medication Sig Dispense Refill Last Dose   acetaminophen (TYLENOL) 500 MG tablet Take 500 mg by mouth daily as needed for headache (pain).   prn at prn   albuterol (PROVENTIL HFA;VENTOLIN HFA) 108 (90 Base) MCG/ACT inhaler Inhale 2 puffs into the lungs every 6 (six) hours as needed for wheezing or shortness of breath. 1 Inhaler 6 prn at prn   amiodarone (PACERONE) 200 MG tablet TAKE 1 TABLET BY MOUTH DAILY 90 tablet 0 07/07/2021   aspirin EC 81 MG tablet Take 1 tablet (81 mg total) by mouth daily. 90 tablet 3 07/07/2021   Azelastine HCl 137 MCG/SPRAY SOLN USE 1 TO 2 SPRAYS IN EACH NOSTRIL TWICE A DAY AS NEEDED FOR DRAINAGE 90 mL 0 prn at prn   cholecalciferol (VITAMIN D) 1000 units tablet Take 1,000 Units by mouth 2 (two) times daily.   07/07/2021   ELIQUIS 5 MG TABS tablet TAKE 1 TABLET BY MOUTH TWICE A DAY 180 tablet 1 07/07/2021   fish oil-omega-3 fatty acids 1000 MG capsule Take 1 g by mouth 2 (two) times daily.    07/07/2021    fluticasone-salmeterol (ADVAIR HFA) 230-21 MCG/ACT inhaler Inhale 2 puffs into the lungs 2 (two) times daily. 1 each 12 07/07/2021   furosemide (LASIX) 40 MG tablet Take 1 tablet (40 mg total) by mouth 2 (two) times daily. Dr. Rockey Situ advised alternate 40 mg twice a day with 40 mg daily every other day 180 tablet 3 07/07/2021   hydrALAZINE (APRESOLINE) 10 MG tablet TAKE ONE TABLET BY MOUTH THREE TIMES A DAY 270 tablet 1 07/07/2021   losartan (COZAAR) 100 MG tablet TAKE 1 TABLET BY MOUTH DAILY 90 tablet 1 07/07/2021   Misc Natural Products (OSTEO BI-FLEX JOINT SHIELD PO) Take 1 tablet by mouth 2 (two) times daily.    07/07/2021   Multiple Vitamin (MULTIVITAMIN WITH MINERALS) TABS tablet Take 0.5 tablets by mouth 2 (two) times daily.   07/07/2021   Polyethyl Glycol-Propyl Glycol (SYSTANE OP) Place 1 drop into  both eyes daily as needed (dry eyes).   prn at prn   potassium chloride (KLOR-CON) 10 MEQ tablet TAKE 1 TABLET BY MOUTH TWICE A DAY 180 tablet 0 07/07/2021   propranolol (INDERAL) 20 MG tablet TAKE ONE TABLET BY MOUTH THREE TIMES A DAY 270 tablet 0 07/07/2021   rosuvastatin (CRESTOR) 40 MG tablet TAKE 1 TABLET BY MOUTH DAILY 90 tablet 0 07/07/2021   sucralfate (CARAFATE) 1 g tablet Take 1 g by mouth 2 (two) times daily.   07/07/2021   mupirocin ointment (BACTROBAN) 2 % Apply 1 application topically 2 (two) times daily. (Patient not taking: Reported on 07/07/2021) 60 g 0 Completed Course   nitroGLYCERIN (NITROSTAT) 0.4 MG SL tablet Place 1 tablet (0.4 mg total) under the tongue every 5 (five) minutes as needed. 25 tablet 0 prn at prn   pantoprazole (PROTONIX) 40 MG tablet TAKE 1 TABLET BY MOUTH DAILY (Patient not taking: Reported on 07/07/2021) 30 tablet 5 Not Taking   traMADol (ULTRAM) 50 MG tablet Take 1 tablet (50 mg total) by mouth 2 (two) times daily as needed. (Patient not taking: Reported on 07/07/2021) 10 tablet 0 Not Taking   Scheduled:  amiodarone  200 mg Oral Daily   [START ON 07/11/2021] pantoprazole  40 mg  Intravenous Q12H   sodium chloride flush  3 mL Intravenous Q12H   Continuous:  sodium chloride     pantoprazole 8 mg/hr (07/09/21 0535)   JAS:[id number] **OR** acetaminophen Anti-infectives (From admission, onward)    None      Scheduled Meds:  amiodarone  200 mg Oral Daily   [START ON 07/11/2021] pantoprazole  40 mg Intravenous Q12H   sodium chloride flush  3 mL Intravenous Q12H   Continuous Infusions:  sodium chloride     pantoprazole 8 mg/hr (07/09/21 0535)   PRN Meds:.acetaminophen **OR** acetaminophen   Assessment: Principal Problem:   GI bleeding Active Problems:   Essential hypertension, benign   Obstructive sleep apnea   Coronary artery disease involving native coronary artery of native heart with unstable angina pectoris (HCC)   Paroxysmal atrial fibrillation (HCC)   Chronic heart failure with preserved ejection fraction (HCC)   Acute renal failure superimposed on stage 3b chronic kidney disease (HCC)   Symptomatic anemia   Plan: Melena, symptomatic iron deficiency anemia Eliquis has been held since 3/10 Hemoglobin is relatively stable, BUN/creatinine are improving No evidence of active GI bleed Continue pantoprazole drip Continue clear liquid diet N.p.o. effective 5 AM tomorrow We will proceed with upper endoscopy tomorrow, if unremarkable recommend colonoscopy with possible video capsule endoscopy.  Colonoscopy in 2013 was suboptimal due to fair prep  Discussed my recommendations with patient and she is agreeable I have discussed alternative options, risks & benefits,  which include, but are not limited to, bleeding, infection, perforation,respiratory complication & drug reaction.  The patient agrees with this plan & written consent will be obtained.      LOS: 2 days   Treyvion Durkee 07/09/2021, 4:34 PM

## 2021-07-09 NOTE — Assessment & Plan Note (Signed)
Significant lower extremity edema, per patient this is chronic and she uses leg wraps and Lasix. ?-Monitor for any respiratory distress as patient is being given some fluid and blood. ?-Can use Lasix as needed-also depending on her blood pressure, currently holding home dose ?

## 2021-07-09 NOTE — Assessment & Plan Note (Signed)
Currently in sinus rhythm. ?-Continue amiodarone ?-Keep holding Eliquis-last dose on 07/07/2021 AM ?

## 2021-07-09 NOTE — Assessment & Plan Note (Signed)
Most likely with GI bleed.  Creatinine with mild improvement at 1.87 with BUN of 29 today.  Baseline creatinine 1.1-1.2. ?-Monitor renal function ?-Avoid nephrotoxins ?

## 2021-07-10 ENCOUNTER — Encounter: Payer: Self-pay | Admitting: Family Medicine

## 2021-07-10 ENCOUNTER — Inpatient Hospital Stay: Payer: Medicare Other | Admitting: Anesthesiology

## 2021-07-10 ENCOUNTER — Encounter
Admission: EM | Disposition: A | Discharge status: Home / Self Care | Payer: Self-pay | Source: Home / Self Care | Attending: Internal Medicine

## 2021-07-10 ENCOUNTER — Other Ambulatory Visit: Payer: Self-pay

## 2021-07-10 DIAGNOSIS — D509 Iron deficiency anemia, unspecified: Secondary | ICD-10-CM | POA: Diagnosis present

## 2021-07-10 DIAGNOSIS — K319 Disease of stomach and duodenum, unspecified: Secondary | ICD-10-CM | POA: Diagnosis present

## 2021-07-10 HISTORY — PX: ESOPHAGOGASTRODUODENOSCOPY (EGD) WITH PROPOFOL: SHX5813

## 2021-07-10 LAB — BASIC METABOLIC PANEL
Anion gap: 4 — ABNORMAL LOW (ref 5–15)
BUN: 22 mg/dL (ref 8–23)
CO2: 22 mmol/L (ref 22–32)
Calcium: 8.5 mg/dL — ABNORMAL LOW (ref 8.9–10.3)
Chloride: 113 mmol/L — ABNORMAL HIGH (ref 98–111)
Creatinine, Ser: 1.64 mg/dL — ABNORMAL HIGH (ref 0.44–1.00)
GFR, Estimated: 31 mL/min — ABNORMAL LOW (ref >60)
Glucose, Bld: 82 mg/dL (ref 70–99)
Potassium: 4.4 mmol/L (ref 3.5–5.1)
Sodium: 139 mmol/L (ref 135–145)

## 2021-07-10 LAB — CBC
HCT: 28.9 % — ABNORMAL LOW (ref 36.0–46.0)
Hemoglobin: 9.3 g/dL — ABNORMAL LOW (ref 12.0–15.0)
MCH: 30.4 pg (ref 26.0–34.0)
MCHC: 32.2 g/dL (ref 30.0–36.0)
MCV: 94.4 fL (ref 80.0–100.0)
Platelets: 238 10*3/uL (ref 150–400)
RBC: 3.06 MIL/uL — ABNORMAL LOW (ref 3.87–5.11)
RDW: 16.6 % — ABNORMAL HIGH (ref 11.5–15.5)
WBC: 6.9 10*3/uL (ref 4.0–10.5)
nRBC: 0 % (ref 0.0–0.2)

## 2021-07-10 LAB — VITAMIN B12: Vitamin B-12: 1059 pg/mL — ABNORMAL HIGH (ref 180–914)

## 2021-07-10 SURGERY — ESOPHAGOGASTRODUODENOSCOPY (EGD) WITH PROPOFOL
Anesthesia: General

## 2021-07-10 MED ORDER — HYDRALAZINE HCL 50 MG PO TABS
25.0000 mg | ORAL_TABLET | Freq: Three times a day (TID) | ORAL | Status: DC
Start: 2021-07-10 — End: 2021-07-11
  Administered 2021-07-10: 25 mg via ORAL
  Filled 2021-07-10 (×3): qty 1

## 2021-07-10 MED ORDER — SODIUM CHLORIDE 0.9 % IV SOLN
INTRAVENOUS | Status: DC
  Administered 2021-07-11: 20 mL/h via INTRAVENOUS

## 2021-07-10 MED ORDER — PROPOFOL 500 MG/50ML IV EMUL
INTRAVENOUS | Status: AC
Start: 2021-07-10 — End: ?
  Filled 2021-07-10: qty 50

## 2021-07-10 MED ORDER — HYDRALAZINE HCL 10 MG PO TABS: 10.0000 mg | ORAL_TABLET | Freq: Three times a day (TID) | ORAL | Status: DC

## 2021-07-10 MED ORDER — IPRATROPIUM-ALBUTEROL 0.5-2.5 (3) MG/3ML IN SOLN
3.0000 mL | RESPIRATORY_TRACT | Status: DC | PRN
  Administered 2021-07-10: 3 mL via RESPIRATORY_TRACT
  Filled 2021-07-10: qty 3

## 2021-07-10 MED ORDER — WITCH HAZEL-GLYCERIN EX PADS
MEDICATED_PAD | CUTANEOUS | Status: DC | PRN
  Filled 2021-07-10: qty 100

## 2021-07-10 MED ORDER — LACTATED RINGERS IV SOLN
INTRAVENOUS | Status: DC | PRN
Start: 2021-07-10 — End: 2021-07-10

## 2021-07-10 MED ORDER — PROPOFOL 500 MG/50ML IV EMUL
INTRAVENOUS | Status: DC | PRN
Start: 2021-07-10 — End: 2021-07-10
  Administered 2021-07-10: 100 ug/kg/min via INTRAVENOUS
  Administered 2021-07-10: 80 mg via INTRAVENOUS

## 2021-07-10 MED ORDER — LIDOCAINE HCL (PF) 2 % IJ SOLN
INTRAMUSCULAR | Status: AC
  Filled 2021-07-10: qty 5

## 2021-07-10 MED ORDER — PANTOPRAZOLE SODIUM 40 MG PO TBEC: 40.0000 mg | DELAYED_RELEASE_TABLET | Freq: Every day | ORAL | Status: DC

## 2021-07-10 MED ORDER — ONDANSETRON HCL 4 MG/2ML IJ SOLN
4.0000 mg | Freq: Four times a day (QID) | INTRAMUSCULAR | Status: DC | PRN
  Administered 2021-07-10: 4 mg via INTRAVENOUS
  Filled 2021-07-10: qty 2

## 2021-07-10 MED ORDER — POLYETHYLENE GLYCOL 3350 17 GM/SCOOP PO POWD
1.0000 | Freq: Once | ORAL | Status: AC
  Administered 2021-07-10: 255 g via ORAL
  Filled 2021-07-10: qty 255

## 2021-07-10 MED ORDER — ONDANSETRON HCL 4 MG/2ML IJ SOLN
4.0000 mg | Freq: Once | INTRAMUSCULAR | Status: AC
  Administered 2021-07-10: 4 mg via INTRAVENOUS
  Filled 2021-07-10: qty 2

## 2021-07-10 NOTE — Assessment & Plan Note (Signed)
Patient with concern of upper GI bleed with melena.  She was on Eliquis for paroxysmal atrial fibrillation.  Continue to have some melena, per patient it started clearing up.  Hemoglobin 9.3 after getting 2 unit PRBC,  ?-Patient underwent EGD with GI today, mildly erythematous gastric mucosa with no obvious bleeding.  They are recommending doing colonoscopy tomorrow. ?-Continue PPI ?-Monitor hemoglobin ?-Keep holding Eliquis-last dose was on 07/07/2021 in the morning ?

## 2021-07-10 NOTE — Assessment & Plan Note (Signed)
Blood pressure started trending up. ?-Restart home hydralazine at 25 mg 3 times daily instead of 10. ?-Keep holding losartan due to AKI ?-Monitor blood pressure ?

## 2021-07-10 NOTE — Progress Notes (Signed)
?Progress Note ? ? ?Patient: April [name] YPP:[id number] DOB: 01-Jul-1940 DOA: 07/07/2021     3 ?DOS: the patient was seen and examined on 07/10/2021 ?  ?Brief hospital course: ?Taken from H&P. ? ?HARUNA ROHLFS is a pleasant 81 y.o. female with medical history significant for CAD, CKD 3B, PAF on Eliquis, OSA on CPAP, and lymphedema, now presenting to the emergency department with fatigue, exertional dyspnea, melena, and outpatient blood work with decreased hemoglobin and increased creatinine.  Patient reports 2 or 3 weeks dark tarry stools with worsening fatigue and exertional dyspnea.  She was noted to have positive FOBT and decrease in hemoglobin at PCP office and advised to come to ED for further evaluation. ? ?She had a EGD in 2016 with moderate size hiatal hernia, erosive gastropathy, and gastritis. ?  ?She had previously been taking PPI but stopped in December for H. pylori testing which was negative, felt that her chronic queasiness had improved off of the PPI, and was going to take an H2 blocker instead, but never started that. ? ?On arrival to ED he was hemodynamically stable, labs with hemoglobin of 8.6 which decreased to 7.2 next morning on 12/08/2021.  Worsening BUN and creatinine. ?She was started on IV Protonix and gastroenterology was consulted. ? ?3/10: Patient continued to feel little dizzy and had melena.  Hemoglobin decreased to 7.2. ?She was hypotensive, ordered 500 cc of normal saline bolus and 2 unit of PRBC. ?No nausea or vomiting.  Most likely EGD today. ?Eliquis is on hold.  Holding antihypertensives. ? ?3/11: Hemoglobin improved to 9.2 after getting 2 unit of PRBC, drifting down again.  Had 1 episode of melena this morning.  GI is recommending 48 hours of Eliquis washout which has been completed now as her last dose was on 07/08/2018 3 in the morning.  Discussed with GI but they have would like to wait for another day and plan for EGD tomorrow. ? ?3/12: Patient did not had any more melena.   Hemoglobin with some improvement to 9.3. ?Patient underwent EGD which shows mildly erythematous gastric mucosa, no obvious bleeding.  Biopsies were taken. ?GI is recommending colonoscopy tomorrow. ? ? ?Assessment and Plan: ?* GI bleeding ?Patient with concern of upper GI bleed with melena.  She was on Eliquis for paroxysmal atrial fibrillation.  Continue to have some melena, per patient it started clearing up.  Hemoglobin 9.3 after getting 2 unit PRBC,  ?-Patient underwent EGD with GI today, mildly erythematous gastric mucosa with no obvious bleeding.  They are recommending doing colonoscopy tomorrow. ?-Continue PPI ?-Monitor hemoglobin ?-Keep holding Eliquis-last dose was on 07/07/2021 in the morning ? ?Paroxysmal atrial fibrillation (HCC) ?Currently in sinus rhythm. ?-Continue amiodarone ?-Keep holding Eliquis-last dose on 07/07/2021 AM ? ?Essential hypertension, benign ?Blood pressure started trending up. ?-Restart home hydralazine at 25 mg 3 times daily instead of 10. ?-Keep holding losartan due to AKI ?-Monitor blood pressure ? ?Chronic heart failure with preserved ejection fraction ([place]) ?Significant lower extremity edema, per patient this is chronic and she uses leg wraps and Lasix. ?-Monitor for any respiratory distress as patient is being given some fluid and blood. ?-Can use Lasix as needed-also depending on her blood pressure, currently holding home dose ? ?Acute renal failure superimposed on stage 3b chronic kidney disease ([place]) ?Most likely with GI bleed.  Creatinine with mild improvement at 1.87 with BUN of 29 today.  Baseline creatinine 1.1-1.2. ?-Monitor renal function ?-Avoid nephrotoxins ? ?Coronary artery disease involving native coronary artery of  native heart with unstable angina pectoris ([place]) ?History of STEMI in 2017, s/p PCI involving RCA. ?No chest pain.  No troponin was checked during this admission. ?-Check troponin ?-Holding home aspirin ?-We will continue Crestor after EGD as she is n.p.o.  now ? ?Obstructive sleep apnea ?- Continue with CPAP at night ? ? ?Subjective: Patient was seen and examined before EGD today.  No new complaints.  Per patient she did not had any more melena.  She wants to go home.  Son at bedside. ? ?Physical Exam: ?Vitals:  ? 07/10/21 1058 07/10/21 1120 07/10/21 1122 07/10/21 1209  ?BP: (!) 157/84 (!) 110/56 (!) 110/56 126/71  ?Pulse: 72 61 61 (!) 59  ?Resp: 18 (!) 21  19  ?Temp: (!) 96.9 ?F (36.1 ?C) 97.7 ?F (36.5 ?C) 97.7 ?F (36.5 ?C) 98.2 ?F (36.8 ?C)  ?TempSrc: Temporal  Skin Oral  ?SpO2: 100% 96% 96% 98%  ?Weight: 93.2 kg     ?Height: 5' (1.524 m)     ? ?General.     In no acute distress. ?Pulmonary.  Lungs clear bilaterally, normal respiratory effort. ?CV.  Regular rate and rhythm, no JVD, rub or murmur. ?Abdomen.  Soft, nontender, nondistended, BS positive. ?CNS.  Alert and oriented .  No focal neurologic deficit. ?Extremities.  No edema, no cyanosis, pulses intact and symmetrical. ?Psychiatry.  Judgment and insight appears normal. ? ?Data Reviewed: ?Reviewed other notes and labs. ? ?Family Communication: Discussed with son at bedside ? ?Disposition: ?Status is: Inpatient ?Remains inpatient appropriate because: Severity of illness and needing for procedures. ? ? Planned Discharge Destination: Home ? ?DVT prophylaxis.  SCDs ? ?Time spent: 45 minutes ? ?This record has been created using Systems analyst. Errors have been sought and corrected,but may not always be located. Such creation errors do not reflect on the standard of care. ? ?Author: ?Lorella Nimrod, MD ?07/10/2021 1:09 PM ? ?For on call review [email or web address].  ?

## 2021-07-10 NOTE — Op Note (Signed)
[facility] ?Gastroenterology ?Patient Name: April [name] ?Procedure Date: 07/10/2021 9:40 AM ?MRN: [id number] ?Account #: [id number] ?Date of Birth: 1941-02-05 ?Admit Type: Outpatient ?Age: 81 ?Room: [facility] ENDO ROOM 4 ?Gender: Female ?Note Status: Finalized ?Instrument Name: Upper-Endoscope [id number] ?Procedure:             Upper GI endoscopy ?Indications:           Unexplained iron deficiency anemia, Melena ?Providers:             Lin Landsman MD, MD ?Medicines:             General Anesthesia ?Complications:         No immediate complications. Estimated blood loss: None. ?Procedure:             Pre-Anesthesia Assessment: ?                       - Prior to the procedure, a History and Physical was  ?                       performed, and patient medications and allergies were  ?                       reviewed. The patient is competent. The risks and  ?                       benefits of the procedure and the sedation options and  ?                       risks were discussed with the patient. All questions  ?                       were answered and informed consent was obtained.  ?                       Patient identification and proposed procedure were  ?                       verified by the physician, the nurse, the  ?                       anesthesiologist, the anesthetist and the technician  ?                       in the pre-procedure area in the procedure room in the  ?                       endoscopy suite. Mental Status Examination: alert and  ?                       oriented. Airway Examination: normal oropharyngeal  ?                       airway and neck mobility. Respiratory Examination:  ?                       clear to auscultation. CV Examination: normal.  ?                       Prophylactic  Antibiotics: The patient does not require  ?                       prophylactic antibiotics. Prior Anticoagulants: The  ?                       patient has taken Eliquis (apixaban), last dose  was 3  ?                       days prior to procedure. ASA Grade Assessment: III - A  ?                       patient with severe systemic disease. After reviewing  ?                       the risks and benefits, the patient was deemed in  ?                       satisfactory condition to undergo the procedure. The  ?                       anesthesia plan was to use general anesthesia.  ?                       Immediately prior to administration of medications,  ?                       the patient was re-assessed for adequacy to receive  ?                       sedatives. The heart rate, respiratory rate, oxygen  ?                       saturations, blood pressure, adequacy of pulmonary  ?                       ventilation, and response to care were monitored  ?                       throughout the procedure. The physical status of the  ?                       patient was re-assessed after the procedure. ?                       After obtaining informed consent, the endoscope was  ?                       passed under direct vision. Throughout the procedure,  ?                       the patient's blood pressure, pulse, and oxygen  ?                       saturations were monitored continuously. The Endoscope  ?                       was introduced through the mouth, and advanced to the  ?  second part of duodenum. The upper GI endoscopy was  ?                       accomplished without difficulty. The patient tolerated  ?                       the procedure well. ?Findings: ?     The duodenal bulb and second portion of the duodenum were normal. ?     Diffuse mildly erythematous mucosa without bleeding was found in the  ?     gastric antrum and in the prepyloric region of the stomach. Biopsies  ?     were taken with a cold forceps for histology. ?     A medium-sized hiatal hernia was present. ?     The cardia and gastric fundus were normal on retroflexion. ?     The gastroesophageal junction and  examined esophagus were normal. ?Impression:            - Normal duodenal bulb and second portion of the  ?                       duodenum. ?                       - Erythematous mucosa in the antrum and prepyloric  ?                       region of the stomach. Biopsied. ?                       - Medium-sized hiatal hernia. ?                       - Normal gastroesophageal junction and esophagus. ?Recommendation:        - Return patient to hospital ward for ongoing care. ?                       - Clear liquid diet today. ?                       - Perform a colonoscopy tomorrow. ?Procedure Code(s):     --- Professional --- ?                       [phone], Esophagogastroduodenoscopy, flexible,  ?                       transoral; with biopsy, single or multiple ?Diagnosis Code(s):     --- Professional --- ?                       K31.89, Other diseases of stomach and duodenum ?                       K44.9, Diaphragmatic hernia without obstruction or  ?                       gangrene ?                       D50.9, Iron deficiency anemia, unspecified ?  K92.1, Melena (includes Hematochezia) ?CPT copyright 2019 American Medical Association. All rights reserved. ?The codes documented in this report are preliminary and upon coder review may  ?be revised to meet current compliance requirements. ?Dr. Ulyess Mort ?Aylissa Heinemann Raeanne Gathers MD, MD ?07/10/2021 11:15:29 AM ?This report has been signed electronically. ?Number of Addenda: 0 ?Note Initiated On: 07/10/2021 9:40 AM ?Estimated Blood Loss:  Estimated blood loss: none. ?     [facility] ?

## 2021-07-10 NOTE — Transfer of Care (Signed)
Immediate Anesthesia Transfer of Care Note ? ?Patient: April [name] ? ?Procedure(s) Performed: ESOPHAGOGASTRODUODENOSCOPY (EGD) WITH PROPOFOL ? ?Patient Location: PACU ? ?Anesthesia Type:MAC ? ?Level of Consciousness: awake, alert , oriented and patient cooperative ? ?Airway & Oxygen Therapy: Patient Spontanous Breathing and Patient connected to nasal cannula oxygen ? ?Post-op Assessment: Report given to RN and Post -op Vital signs reviewed and stable ? ?Post vital signs: stable ? ?Last Vitals:  ?Vitals Value Taken Time  ?BP 110/56 07/10/21 1122  ?Temp 36.5 ?C 07/10/21 1122  ?Pulse 61 07/10/21 1122  ?Resp 21 07/10/21 1120  ?SpO2 96 % 07/10/21 1122  ? ? ?Last Pain:  ?Vitals:  ? 07/10/21 1122  ?TempSrc: Skin  ?PainSc:   ?   ? ?  ? ?Complications: No notable events documented. ?

## 2021-07-11 ENCOUNTER — Telehealth: Payer: Self-pay | Admitting: Cardiovascular Disease

## 2021-07-11 ENCOUNTER — Encounter
Admission: EM | Disposition: A | Discharge status: Home / Self Care | Payer: Self-pay | Source: Home / Self Care | Attending: Internal Medicine

## 2021-07-11 ENCOUNTER — Inpatient Hospital Stay: Payer: Medicare Other | Admitting: Anesthesiology

## 2021-07-11 ENCOUNTER — Ambulatory Visit: Payer: Medicare Other | Admitting: Internal Medicine

## 2021-07-11 ENCOUNTER — Encounter: Payer: Self-pay | Admitting: Gastroenterology

## 2021-07-11 DIAGNOSIS — K922 Gastrointestinal hemorrhage, unspecified: Secondary | ICD-10-CM | POA: Diagnosis not present

## 2021-07-11 DIAGNOSIS — D509 Iron deficiency anemia, unspecified: Secondary | ICD-10-CM | POA: Diagnosis not present

## 2021-07-11 HISTORY — PX: COLONOSCOPY WITH PROPOFOL: SHX5780

## 2021-07-11 HISTORY — PX: GIVENS CAPSULE STUDY: SHX5432

## 2021-07-11 LAB — BASIC METABOLIC PANEL
Anion gap: 6 (ref 5–15)
BUN: 16 mg/dL (ref 8–23)
CO2: 23 mmol/L (ref 22–32)
Calcium: 8.5 mg/dL — ABNORMAL LOW (ref 8.9–10.3)
Chloride: 109 mmol/L (ref 98–111)
Creatinine, Ser: 1.36 mg/dL — ABNORMAL HIGH (ref 0.44–1.00)
GFR, Estimated: 39 mL/min — ABNORMAL LOW (ref >60)
Glucose, Bld: 97 mg/dL (ref 70–99)
Potassium: 4.2 mmol/L (ref 3.5–5.1)
Sodium: 138 mmol/L (ref 135–145)

## 2021-07-11 LAB — CBC
HCT: 29.3 % — ABNORMAL LOW (ref 36.0–46.0)
Hemoglobin: 9.4 g/dL — ABNORMAL LOW (ref 12.0–15.0)
MCH: 30.7 pg (ref 26.0–34.0)
MCHC: 32.1 g/dL (ref 30.0–36.0)
MCV: 95.8 fL (ref 80.0–100.0)
Platelets: 242 10*3/uL (ref 150–400)
RBC: 3.06 MIL/uL — ABNORMAL LOW (ref 3.87–5.11)
RDW: 16 % — ABNORMAL HIGH (ref 11.5–15.5)
WBC: 8.6 10*3/uL (ref 4.0–10.5)
nRBC: 0 % (ref 0.0–0.2)

## 2021-07-11 SURGERY — COLONOSCOPY WITH PROPOFOL
Anesthesia: General

## 2021-07-11 MED ORDER — HYDRALAZINE HCL 10 MG PO TABS
10.0000 mg | ORAL_TABLET | Freq: Three times a day (TID) | ORAL | Status: DC
  Administered 2021-07-11 – 2021-07-12 (×3): 10 mg via ORAL
  Filled 2021-07-11 (×7): qty 1

## 2021-07-11 MED ORDER — PROPOFOL 10 MG/ML IV BOLUS
INTRAVENOUS | Status: DC | PRN
  Administered 2021-07-11: 50 mg via INTRAVENOUS

## 2021-07-11 MED ORDER — EPHEDRINE SULFATE (PRESSORS) 50 MG/ML IJ SOLN
INTRAMUSCULAR | Status: DC | PRN
  Administered 2021-07-11: 5 mg via INTRAVENOUS

## 2021-07-11 MED ORDER — LIDOCAINE HCL (CARDIAC) PF 100 MG/5ML IV SOSY
PREFILLED_SYRINGE | INTRAVENOUS | Status: DC | PRN
  Administered 2021-07-11: 50 mg via INTRAVENOUS

## 2021-07-11 MED ORDER — PROPOFOL 500 MG/50ML IV EMUL
INTRAVENOUS | Status: DC | PRN
  Administered 2021-07-11: 150 ug/kg/min via INTRAVENOUS

## 2021-07-11 NOTE — Transfer of Care (Signed)
Immediate Anesthesia Transfer of Care Note ? ?Patient: April [name] ? ?Procedure(s) Performed: COLONOSCOPY WITH PROPOFOL ?GIVENS CAPSULE STUDY ? ?Patient Location: Endoscopy Unit ? ?Anesthesia Type:General ? ?Level of Consciousness: drowsy ? ?Airway & Oxygen Therapy: Patient Spontanous Breathing and Patient connected to nasal cannula oxygen ? ?Post-op Assessment: Report given to RN and Post -op Vital signs reviewed and stable ? ?Post vital signs: Reviewed and stable ? ?Last Vitals:  ?Vitals Value Taken Time  ?BP 116/44 07/11/21 1129  ?Temp 36.4 ?C 07/11/21 1129  ?Pulse 77 07/11/21 1131  ?Resp 19 07/11/21 1131  ?SpO2 98 % 07/11/21 1131  ?Vitals shown include unvalidated device data. ? ?Last Pain:  ?Vitals:  ? 07/11/21 1129  ?TempSrc: Temporal  ?PainSc: Asleep  ?   ? ?  ? ?Complications: No notable events documented. ?

## 2021-07-11 NOTE — Anesthesia Preprocedure Evaluation (Addendum)
Anesthesia Evaluation  ?Patient identified by MRN, date of birth, ID band ?Patient awake ? ? ? ?Reviewed: ?Allergy & Precautions, NPO status , Patient's Chart, lab work & pertinent test results, reviewed documented beta blocker date and time  ? ?Airway ?Mallampati: III ? ?TM Distance: >3 FB ? ? ? ? Dental ?no notable dental hx. ? ?  ?Pulmonary ?shortness of breath and with exertion, sleep apnea ,  ?  ?breath sounds clear to auscultation ? ? ? ? ? ? Cardiovascular ?Exercise Tolerance: Poor ?hypertension, Pt. on home beta blockers ?+ CAD, + Past MI, + Cardiac Stents (2017, patent on cath in 2019) and + DOE  ?+ dysrhythmias (LBBB, Afib on amiodarone. Eliquis has been held) Atrial Fibrillation  ?Rhythm:Regular Rate:Normal ? ?HLD ?Grade 2 DD ?01/2018 Echo ([place], [place]): EF 55-60%, Gr2 DD, RVSP 25mHg. ? ?lymphedema ?  ?Neuro/Psych ? Headaches,   ? GI/Hepatic ?hiatal hernia (2016 moderate size ), GERD  ,2 or 3 weeks dark tarry stools with worsening fatigue and exertional dyspnea ? ?EGD 3/12: mildly erythematous gastric mucosa ?  ?Endo/Other  ?diabetes, Type 2Obesity ? Renal/GU ?Renal InsufficiencyRenal disease  ? ?  ?Musculoskeletal ? ? Abdominal ?(+) + obese,   ?Peds ? Hematology ? ?(+) Blood dyscrasia, anemia , S/p 2 unit of PRBC   ?Anesthesia Other Findings ? ? Reproductive/Obstetrics ? ?  ? ? ? ? ? ? ? ? ? ? ? ? ? ?  ?  ? ? ? ? ? ? ? ?Anesthesia Physical ? ?Anesthesia Plan ? ?ASA: III ? ?Anesthesia Plan: General  ? ?Post-op Pain Management:   ? ?Induction: Intravenous ? ?PONV Risk Score and Plan: 3 and Propofol infusion and TIVA ? ?Airway Management Planned: Natural Airway and Simple Face Mask ? ?Additional Equipment:  ? ?Intra-op Plan:  ? ?Post-operative Plan:  ? ?Informed Consent: I have reviewed the patients History and Physical, chart, labs and discussed the procedure including the risks, benefits and alternatives for the proposed anesthesia with the patient or  authorized representative who has indicated his/her understanding and acceptance.  ? ? ? ?Dental advisory given ? ?Plan Discussed with: Anesthesiologist and CRNA ? ?Anesthesia Plan Comments:   ? ? ? ? ? ?Anesthesia Quick Evaluation ? ?

## 2021-07-11 NOTE — Op Note (Signed)
[facility] ?Gastroenterology ?Patient Name: April [name] ?Procedure Date: 07/11/2021 10:59 AM ?MRN: [id number] ?Account #: [id number] ?Date of Birth: 26-May-1940 ?Admit Type: Inpatient ?Age: 81 ?Room: [facility] ENDO ROOM 1 ?Gender: Female ?Note Status: Finalized ?Instrument Name: Colonoscope [id number] ?Procedure:             Colonoscopy ?Indications:           Melena ?Providers:             Lucilla Lame MD, MD ?Referring MD:          Einar Pheasant, MD (Referring MD) ?Medicines:             Propofol per Anesthesia ?Complications:         No immediate complications. ?Procedure:             Pre-Anesthesia Assessment: ?                       - Prior to the procedure, a History and Physical was  ?                       performed, and patient medications and allergies were  ?                       reviewed. The patient's tolerance of previous  ?                       anesthesia was also reviewed. The risks and benefits  ?                       of the procedure and the sedation options and risks  ?                       were discussed with the patient. All questions were  ?                       answered, and informed consent was obtained. Prior  ?                       Anticoagulants: The patient has taken Eliquis  ?                       (apixaban), last dose was 4 days prior to procedure.  ?                       ASA Grade Assessment: III - A patient with severe  ?                       systemic disease. After reviewing the risks and  ?                       benefits, the patient was deemed in satisfactory  ?                       condition to undergo the procedure. ?                       After obtaining informed consent, the colonoscope was  ?  passed under direct vision. Throughout the procedure,  ?                       the patient's blood pressure, pulse, and oxygen  ?                       saturations were monitored continuously. The  ?                       Colonoscope was  introduced through the anus and  ?                       advanced to the the terminal ileum. The colonoscopy  ?                       was performed without difficulty. The patient  ?                       tolerated the procedure well. The quality of the bowel  ?                       preparation was excellent. ?Findings: ?     The perianal and digital rectal examinations were normal. ?     Three sessile polyps were found in the transverse colon. The polyps were  ?     2 to 4 mm in size. These polyps were removed with a cold biopsy forceps.  ?     Resection and retrieval were complete. ?     A 5 mm polyp was found in the descending colon. The polyp was sessile.  ?     The polyp was removed with a cold biopsy forceps. Resection and  ?     retrieval were complete. To prevent bleeding post-intervention, one  ?     hemostatic clip was successfully placed (MR conditional). There was no  ?     bleeding at the end of the procedure. ?     Multiple small-mouthed diverticula were found in the sigmoid colon. ?     Non-bleeding internal hemorrhoids were found during retroflexion. The  ?     hemorrhoids were Grade I (internal hemorrhoids that do not prolapse). ?     The terminal ileum appeared normal. ?Impression:            - Three 2 to 4 mm polyps in the transverse colon,  ?                       removed with a cold biopsy forceps. Resected and  ?                       retrieved. ?                       - One 5 mm polyp in the descending colon, removed with  ?                       a cold biopsy forceps. Resected and retrieved. Clip  ?                       (MR conditional) was placed. ?                       -  Diverticulosis in the sigmoid colon. ?                       - Non-bleeding internal hemorrhoids. ?                       - The examined portion of the ileum was normal. ?Recommendation:        - Discharge patient to home. ?                       - Resume previous diet. ?                       - Continue present  medications. ?                       - To visualize the small bowel, perform video capsule  ?                       endoscopy today. ?Procedure Code(s):     --- Professional --- ?                       [phone], Colonoscopy, flexible; with biopsy, single or  ?                       multiple ?Diagnosis Code(s):     --- Professional --- ?                       K92.1, Melena (includes Hematochezia) ?                       K63.5, Polyp of colon ?CPT copyright 2019 American Medical Association. All rights reserved. ?The codes documented in this report are preliminary and upon coder review may  ?be revised to meet current compliance requirements. ?Lucilla Lame MD, MD ?07/11/2021 11:27:09 AM ?This report has been signed electronically. ?Number of Addenda: 0 ?Note Initiated On: 07/11/2021 10:59 AM ?Scope Withdrawal Time: 0 hours 7 minutes 22 seconds  ?Total Procedure Duration: 0 hours 11 minutes 22 seconds  ?Estimated Blood Loss:  Estimated blood loss: none. ?     [facility] ?

## 2021-07-11 NOTE — Care Management Important Message (Signed)
Important Message ? ?Patient Details  ?Name: April [name] ?MRN: [id number] ?Date of Birth: 1940/08/02 ? ? ?Medicare Important Message Given:  Yes ? ? ? ? ?Juliann Pulse A Alexa Golebiewski ?07/11/2021, 11:27 AM ?

## 2021-07-11 NOTE — Telephone Encounter (Signed)
Pt c/o medication issue: ? ?1. Name of Medication: eliquis ? ?2. How are you currently taking this medication (dosage and times per day)? Holding since Thursday night  ? ?3. Are you having a reaction (difficulty breathing--STAT)? No  ? ?4. What is your medication issue? Admitted and had egd and colonoscopy  ?Patient advised today to reach out to Dr. Rockey Situ and ask when to restart Eliquis .  ? ?Please call patient states dc is planned for today possibly  ? ? ? ? ?

## 2021-07-11 NOTE — Telephone Encounter (Signed)
Attempted to reach pt via phone, phone went to VM ?LDM (DPR aprpoved) ? ?Advised since admitted for gastro bleed, her GI will need to determine when to safely restart her Eliquis.  ? ?Can call back with any further questions or concerns.  ?

## 2021-07-11 NOTE — Progress Notes (Signed)
?Progress Note ? ? ?Patient: April [name] ION:[id number] DOB: [birth year] DOA: 07/07/2021     4 ?DOS: the patient was seen and examined on 07/11/2021 ?  ?Brief hospital course: ?Taken from H&P. ? ?ANNETE AYUSO is a pleasant 81 y.o. female with medical history significant for CAD, CKD 3B, PAF on Eliquis, OSA on CPAP, and lymphedema, now presenting to the emergency department with fatigue, exertional dyspnea, melena, and outpatient blood work with decreased hemoglobin and increased creatinine.  Patient reports 2 or 3 weeks dark tarry stools with worsening fatigue and exertional dyspnea.  She was noted to have positive FOBT and decrease in hemoglobin at PCP office and advised to come to ED for further evaluation. ? ?She had a EGD in 2016 with moderate size hiatal hernia, erosive gastropathy, and gastritis. ?  ?She had previously been taking PPI but stopped in December for H. pylori testing which was negative, felt that her chronic queasiness had improved off of the PPI, and was going to take an H2 blocker instead, but never started that. ? ?On arrival to ED he was hemodynamically stable, labs with hemoglobin of 8.6 which decreased to 7.2 next morning on 12/08/2021.  Worsening BUN and creatinine. ?She was started on IV Protonix and gastroenterology was consulted. ? ?3/10: Patient continued to feel little dizzy and had melena.  Hemoglobin decreased to 7.2. ?She was hypotensive, ordered 500 cc of normal saline bolus and 2 unit of PRBC. ?No nausea or vomiting.  Most likely EGD today. ?Eliquis is on hold.  Holding antihypertensives. ? ?3/11: Hemoglobin improved to 9.2 after getting 2 unit of PRBC, drifting down again.  Had 1 episode of melena this morning.  GI is recommending 48 hours of Eliquis washout which has been completed now as her last dose was on 07/08/2018 3 in the morning.  Discussed with GI but they have would like to wait for another day and plan for EGD tomorrow. ? ?3/12: Patient did not had any more melena.   Hemoglobin with some improvement to 9.3. ?Patient underwent EGD which shows mildly erythematous gastric mucosa, no obvious bleeding.  Biopsies were taken. ?GI is recommending colonoscopy tomorrow. ? ?3/13: Patient had her colonoscopy today, no obvious source of bleeding, few C-cell polyps were removed, sent for pathology.  Nonbleeding internal hemorrhoid was also seen.  Some diverticulosis. ?She was given a capsule endoscopy to look for any obvious source of bleeding and small intestine. ?Monitor will be removed after 8 hours, that will be around 10 PM. ?We will plan for discharge tomorrow. ? ? ?Assessment and Plan: ?* GI bleeding ?GI bleed resolved.  No more melena.  Hemoglobin stable and improving. ?Patient with concern of upper GI bleed with melena.  She was on Eliquis for paroxysmal atrial fibrillation.  Continue to have some melena, per patient it started clearing up.  Hemoglobin 9.3 after getting 2 unit PRBC,  ?-Patient underwent EGD with GI today, mildly erythematous gastric mucosa with no obvious bleeding.  ?Colonoscopy today also without any obvious bleeding site. ?GI is advising capsule endoscopy, monitor will be removed later at night. ?-Continue PPI ?-Monitor hemoglobin ?-Keep holding Eliquis-last dose was on 07/07/2021 in the morning ?-Patient needed discussion with cardiologist after discharge regarding anticoagulation ? ?Paroxysmal atrial fibrillation (HCC) ?Currently in sinus rhythm. ?-Continue amiodarone ?-Keep holding Eliquis-last dose on 07/07/2021 AM ?-Patient need discussion with her cardiologist as an outpatient regarding anticoagulation, we will discharge her without Eliquis. ? ?Essential hypertension, benign ?Blood pressure on lower normal side. ?-Decrease the  dose of hydralazine to 10 mg 3 times a day which was her home dose. ?-Keep holding losartan due to AKI ?-Monitor blood pressure ? ?Chronic heart failure with preserved ejection fraction ([place]) ?Significant lower extremity edema, per patient  this is chronic and she uses leg wraps and Lasix. ?-Monitor for any respiratory distress as patient is being given some fluid and blood. ?-Can use Lasix as needed-also depending on her blood pressure, currently holding home dose ? ?Acute renal failure superimposed on stage 3b chronic kidney disease ([place]) ?Most likely with GI bleed. Baseline creatinine 1.1-1.2.  Creatinine continue to improve. ?-Monitor renal function ?-Avoid nephrotoxins ? ?Coronary artery disease involving native coronary artery of native heart with unstable angina pectoris ([place]) ?History of STEMI in 2017, s/p PCI involving RCA. ?No chest pain.  No troponin was checked during this admission. ?-Check troponin ?-Holding home aspirin ?-We will continue Crestor after EGD as she is n.p.o. now ? ?Obstructive sleep apnea ?- Continue with CPAP at night ? ? ?Subjective: Patient was resting comfortably when seen today.  She was waiting for her colonoscopy.  No complaints.  Son at bedside. ? ?Physical Exam: ?Vitals:  ? 07/11/21 1129 07/11/21 1139 07/11/21 1149 07/11/21 1245  ?BP: (!) 116/44 (!) 104/44 128/64 121/64  ?Pulse: 76   62  ?Resp:    16  ?Temp: (!) 97.5 ?F (36.4 ?C)   98.5 ?F (36.9 ?C)  ?TempSrc: Temporal     ?SpO2: 93%   99%  ?Weight:      ?Height:      ? ?General.     In no acute distress. ?Pulmonary.  Lungs clear bilaterally, normal respiratory effort. ?CV.  Regular rate and rhythm, no JVD, rub or murmur. ?Abdomen.  Soft, nontender, nondistended, BS positive. ?CNS.  Alert and oriented .  No focal neurologic deficit. ?Extremities.  No edema, no cyanosis, pulses intact and symmetrical. ?Psychiatry.  Judgment and insight appears normal. ? ?Data Reviewed: ?Prior labs and notes reviewed. ? ?Family Communication: Discussed with son at bedside. ? ?Disposition: ?Status is: Inpatient ?Remains inpatient appropriate because: Severity of illness ? ? Planned Discharge Destination: Home ? ?Time spent: 40 minutes ? ?This record has been created using [employer]. Errors have been sought and corrected,but may not always be located. Such creation errors do not reflect on the standard of care. ? ?Author: ?Lorella Nimrod, MD ?07/11/2021 2:08 PM ? ?For on call review [email or web address].  ?

## 2021-07-11 NOTE — Assessment & Plan Note (Addendum)
GI bleed resolved.  No more melena.  Hemoglobin stable and improving. ?Patient with concern of upper GI bleed with melena.  She was on Eliquis for paroxysmal atrial fibrillation.  Continue to have some melena, per patient it started clearing up.  Hemoglobin 9.3 after getting 2 unit PRBC,  ?-Patient underwent EGD with GI today, mildly erythematous gastric mucosa with no obvious bleeding.  ?Colonoscopy today also without any obvious bleeding site. ?GI is advising capsule endoscopy, monitor will be removed later at night. ?-Continue PPI ?-Monitor hemoglobin ?-Keep holding Eliquis-last dose was on 07/07/2021 in the morning ?-Patient needed discussion with cardiologist after discharge regarding anticoagulation ?

## 2021-07-11 NOTE — Assessment & Plan Note (Signed)
Most likely with GI bleed. Baseline creatinine 1.1-1.2.  Creatinine continue to improve. ?-Monitor renal function ?-Avoid nephrotoxins ?

## 2021-07-11 NOTE — Anesthesia Postprocedure Evaluation (Signed)
Anesthesia Post Note ? ?Patient: KIERRA JEZEWSKI ? ?Procedure(s) Performed: COLONOSCOPY WITH PROPOFOL ?GIVENS CAPSULE STUDY ? ?Patient location during evaluation: Endoscopy ?Anesthesia Type: General ?Level of consciousness: awake and alert ?Pain management: pain level controlled ?Vital Signs Assessment: post-procedure vital signs reviewed and stable ?Respiratory status: spontaneous breathing, nonlabored ventilation and respiratory function stable ?Cardiovascular status: blood pressure returned to baseline and stable ?Postop Assessment: no apparent nausea or vomiting ?Anesthetic complications: no ? ? ?No notable events documented. ? ? ?Last Vitals:  ?Vitals:  ? 07/11/21 1149 07/11/21 1245  ?BP: 128/64 121/64  ?Pulse:  62  ?Resp:  16  ?Temp:  36.9 ?C  ?SpO2:  99%  ?  ?Last Pain:  ?Vitals:  ? 07/11/21 1209  ?TempSrc:   ?PainSc: 0-No pain  ? ? ?  ?  ?  ?  ?  ?  ? ?[name] ? ? ? ? ?

## 2021-07-11 NOTE — Assessment & Plan Note (Signed)
Blood pressure on lower normal side. ?-Decrease the dose of hydralazine to 10 mg 3 times a day which was her home dose. ?-Keep holding losartan due to AKI ?-Monitor blood pressure ?

## 2021-07-11 NOTE — Assessment & Plan Note (Signed)
Currently in sinus rhythm. ?-Continue amiodarone ?-Keep holding Eliquis-last dose on 07/07/2021 AM ?-Patient need discussion with her cardiologist as an outpatient regarding anticoagulation, we will discharge her without Eliquis. ?

## 2021-07-12 ENCOUNTER — Encounter (INDEPENDENT_AMBULATORY_CARE_PROVIDER_SITE_OTHER): Payer: Self-pay | Admitting: Nurse Practitioner

## 2021-07-12 LAB — SURGICAL PATHOLOGY

## 2021-07-12 MED ORDER — LOSARTAN POTASSIUM 100 MG PO TABS: 100.0000 mg | ORAL_TABLET | Freq: Every day | ORAL | 1 refills | Status: DC

## 2021-07-12 MED ORDER — ASPIRIN EC 81 MG PO TBEC: 81.0000 mg | DELAYED_RELEASE_TABLET | Freq: Every day | ORAL | 3 refills | Status: DC

## 2021-07-12 MED ORDER — APIXABAN 5 MG PO TABS
5.0000 mg | ORAL_TABLET | Freq: Two times a day (BID) | ORAL | 1 refills | Status: DC
Start: 2021-07-12 — End: 2021-07-26

## 2021-07-12 MED ORDER — PANTOPRAZOLE SODIUM 40 MG PO TBEC: 40.0000 mg | DELAYED_RELEASE_TABLET | Freq: Every day | ORAL | 5 refills | Status: DC

## 2021-07-12 NOTE — Telephone Encounter (Signed)
Patient returning call to discuss  ?

## 2021-07-12 NOTE — Care Management (Signed)
?  Transition of Care (TOC) Screening Note ? ? ?Patient Details  ?Name: April [name] ?Date of Birth: 11-Nov-1940 ? ? ?Transition of Care (TOC) CM/SW Contact:    ?Pete Pelt, RN ?Phone Number: ?07/12/2021, 11:38 AM ? ? ? ?Transition of Care Department [facility]) has reviewed patient and no TOC needs have been identified at this time. We will continue to monitor patient advancement through interdisciplinary progression rounds. If new patient transition needs arise, please place a TOC consult. ?  ?

## 2021-07-12 NOTE — Progress Notes (Signed)
Pt reported LED light on capsule study devise was off. Notified on-call GI physician, Dr. Haig Prophet. Given over 8 hours from start of study Dr Haig Prophet advised that study is more than likely complete and that the device (belt) can be removed from patient. Device has now been removed and set at bedside for pick up in a.m. ?

## 2021-07-12 NOTE — Discharge Summary (Signed)
?Physician Discharge Summary ?  ?Patient: April [name] MRN: [id number] DOB: 10-22-1940  ?Admit date:     07/07/2021  ?Discharge date: 07/12/21  ?Discharge Physician: Lorella Nimrod  ? ?PCP: Einar Pheasant, MD  ? ?Recommendations at discharge:  ?Follow-up capsule endoscopy results. ?Follow-up with cardiology to discuss the need of continuation of anticoagulation, holding Eliquis and aspirin at this time. ?Follow-up with gastroenterology. ? ?Discharge Diagnoses: ?Principal Problem: ?  GI bleeding ?Active Problems: ?  Paroxysmal atrial fibrillation ([place]) ?  Essential hypertension, benign ?  Chronic heart failure with preserved ejection fraction ([place]) ?  Acute renal failure superimposed on stage 3b chronic kidney disease ([place]) ?  Coronary artery disease involving native coronary artery of native heart with unstable angina pectoris ([place]) ?  Obstructive sleep apnea ?  Symptomatic anemia ?  Iron deficiency anemia ?  Gastric erythema ? ?Resolved Problems: ?  CAD S/P PCI pRCA Promus DES 3.5 x 16 (4.1 mm), ostRPDA Promus DES 2.5 x 12 (2.7 mm) ? ?Hospital Course: ?Taken from H&P. ? ?April [name] is a pleasant 81 y.o. female with medical history significant for CAD, CKD 3B, PAF on Eliquis, OSA on CPAP, and lymphedema, now presenting to the emergency department with fatigue, exertional dyspnea, melena, and outpatient blood work with decreased hemoglobin and increased creatinine.  Patient reports 2 or 3 weeks dark tarry stools with worsening fatigue and exertional dyspnea.  She was noted to have positive FOBT and decrease in hemoglobin at PCP office and advised to come to ED for further evaluation. ? ?She had a EGD in 2016 with moderate size hiatal hernia, erosive gastropathy, and gastritis. ?  ?She had previously been taking PPI but stopped in December for H. pylori testing which was negative, felt that her chronic queasiness had improved off of the PPI, and was going to take an H2 blocker instead, but never started  that. ? ?On arrival to ED he was hemodynamically stable, labs with hemoglobin of 8.6 which decreased to 7.2 next morning on 12/08/2021.  Worsening BUN and creatinine. ?She was started on IV Protonix and gastroenterology was consulted. ? ?3/10: Patient continued to feel little dizzy and had melena.  Hemoglobin decreased to 7.2. ?She was hypotensive, ordered 500 cc of normal saline bolus and 2 unit of PRBC. ?No nausea or vomiting.  Most likely EGD today. ?Eliquis is on hold.  Holding antihypertensives. ? ?3/11: Hemoglobin improved to 9.2 after getting 2 unit of PRBC, drifting down again.  Had 1 episode of melena this morning.  GI is recommending 48 hours of Eliquis washout which has been completed now as her last dose was on 07/08/2018 3 in the morning.  Discussed with GI but they have would like to wait for another day and plan for EGD tomorrow. ? ?3/12: Patient did not had any more melena.  Hemoglobin with some improvement to 9.3. ?Patient underwent EGD which shows mildly erythematous gastric mucosa, no obvious bleeding.  Biopsies were taken. ?GI is recommending colonoscopy tomorrow. ? ?3/13: Patient had her colonoscopy today, no obvious source of bleeding, few C-cell polyps were removed, sent for pathology.  Nonbleeding internal hemorrhoid was also seen.  Some diverticulosis. ?She was given a capsule endoscopy to look for any obvious source of bleeding and small intestine. ?Monitor will be removed after 8 hours, that will be around 10 PM. ?We will plan for discharge tomorrow. ? ?Patient completed capsule endoscopy and monitor was returned to GI.  They will discuss the results with patient, most likely during  her follow-up visit. ? ?EGD biopsies were negative for H. Pylori. ? ?Colonic polyp with tubular adenoma and negative for malignancy. ? ?We stopped Eliquis and aspirin and advised patient to follow-up with her cardiologist for further recommendations and they can restart if needed after having a mutual  understanding of being high risk for GI bleed. ? ?She will continue the rest of her home medications and follow-up with her providers. ? ?Assessment and Plan: ?* GI bleeding ?GI bleed resolved.  No more melena.  Hemoglobin stable and improving. ?Patient with concern of upper GI bleed with melena.  She was on Eliquis for paroxysmal atrial fibrillation.  Continue to have some melena, per patient it started clearing up.  Hemoglobin 9.3 after getting 2 unit PRBC,  ?-Patient underwent EGD with GI today, mildly erythematous gastric mucosa with no obvious bleeding.  ?Colonoscopy today also without any obvious bleeding site. ?GI is advising capsule endoscopy, monitor will be removed later at night. ?-Continue PPI ?-Monitor hemoglobin ?-Keep holding Eliquis-last dose was on 07/07/2021 in the morning ?-Patient needed discussion with cardiologist after discharge regarding anticoagulation ? ?Paroxysmal atrial fibrillation (HCC) ?Currently in sinus rhythm. ?-Continue amiodarone ?-Keep holding Eliquis-last dose on 07/07/2021 AM ?-Patient need discussion with her cardiologist as an outpatient regarding anticoagulation, we will discharge her without Eliquis. ? ?Essential hypertension, benign ?Blood pressure on lower normal side. ?-Decrease the dose of hydralazine to 10 mg 3 times a day which was her home dose. ?-Keep holding losartan due to AKI ?-Monitor blood pressure ? ?Chronic heart failure with preserved ejection fraction ([place]) ?Significant lower extremity edema, per patient this is chronic and she uses leg wraps and Lasix. ?-Monitor for any respiratory distress as patient is being given some fluid and blood. ?-Can use Lasix as needed-also depending on her blood pressure, currently holding home dose ? ?Acute renal failure superimposed on stage 3b chronic kidney disease ([place]) ?Most likely with GI bleed. Baseline creatinine 1.1-1.2.  Creatinine continue to improve. ?-Monitor renal function ?-Avoid nephrotoxins ? ?Coronary artery  disease involving native coronary artery of native heart with unstable angina pectoris ([place]) ?History of STEMI in 2017, s/p PCI involving RCA. ?No chest pain.  No troponin was checked during this admission. ?-Check troponin ?-Holding home aspirin ?-We will continue Crestor after EGD as she is n.p.o. now ? ?Obstructive sleep apnea ?- Continue with CPAP at night ? ? ?Consultants: Gastroenterology ?Procedures performed: EGD, colonoscopy, capsule endoscopy ?Disposition: Home ?Diet recommendation:  ?Discharge Diet Orders (From admission, onward)  ? ?  Start     Ordered  ? 07/12/21 0000  Diet - low sodium heart healthy       ? 07/12/21 1049  ? ?  ?  ? ?  ? ?Cardiac diet ?DISCHARGE MEDICATION: ?Allergies as of 07/12/2021   ? ?   Reactions  ? Penicillins Other (See Comments)  ? Okay to take amoxicillin/(pt does not recall what the reaction to penicillin was (67-59 years old)  ? Sulfa Antibiotics Rash  ? ?  ? ?  ?Medication List  ?  ? ?STOP taking these medications   ? ?mupirocin ointment 2 % ?Commonly known as: BACTROBAN ?  ?propranolol 20 MG tablet ?Commonly known as: INDERAL ?  ?traMADol 50 MG tablet ?Commonly known as: Ultram ?  ? ?  ? ?TAKE these medications   ? ?acetaminophen 500 MG tablet ?Commonly known as: TYLENOL ?Take 500 mg by mouth daily as needed for headache (pain). ?  ?Advair HFA 230-21 MCG/ACT inhaler ?Generic drug: fluticasone-salmeterol ?Inhale  2 puffs into the lungs 2 (two) times daily. ?  ?albuterol 108 (90 Base) MCG/ACT inhaler ?Commonly known as: VENTOLIN HFA ?Inhale 2 puffs into the lungs every 6 (six) hours as needed for wheezing or shortness of breath. ?  ?amiodarone 200 MG tablet ?Commonly known as: PACERONE ?TAKE 1 TABLET BY MOUTH DAILY ?  ?apixaban 5 MG Tabs tablet ?Commonly known as: Eliquis ?Take 1 tablet (5 mg total) by mouth 2 (two) times daily. Hold until you see your cardiologist ?What changed:  ?how much to take ?additional instructions ?  ?aspirin EC 81 MG tablet ?Take 1 tablet (81 mg  total) by mouth daily. Please hold until you see your cardiologist ?What changed: additional instructions ?  ?Azelastine HCl 137 MCG/SPRAY Soln ?USE 1 TO 2 SPRAYS IN EACH NOSTRIL TWICE A DAY AS NEEDED FOR DRAINAGE ?  ?cholecalcife

## 2021-07-12 NOTE — Telephone Encounter (Signed)
Able to return call to Mrs. Oldenkamp, she reports her nurse in hospital told her to call and make an appt with Dr. Rockey Situ to follow-up with her recent hospital admission d/t medication changes. ? ?Pt placed on schedule for 3/28 at 08:50 am with Ignacia Bayley, NP. Advised pt to get cleared by GI so she can restart her Eliquis, pt verbalized understanding.  ? ?Review medication changes with pt ?STOPPING propranolol 20 MG tablet (INDERAL) ? ?HOLDING aspirin EC & Eliquis until further notice d/t risk of recurrent bleeding ? ?HOLD losartan (COZAAR) per hospital notes "Please hold until you are PCP visit" d/t low potassium ? ?Mrs. Bergeron verbalized understanding, will call back with any further concerns and will wait until f/u appt to discuss her medications. Follow-up with GI as instructed.  ? ?

## 2021-07-13 ENCOUNTER — Telehealth: Payer: Self-pay | Admitting: Gastroenterology

## 2021-07-13 ENCOUNTER — Other Ambulatory Visit: Payer: Self-pay

## 2021-07-13 ENCOUNTER — Encounter: Payer: Self-pay | Admitting: Gastroenterology

## 2021-07-13 ENCOUNTER — Telehealth: Payer: Self-pay

## 2021-07-13 ENCOUNTER — Telehealth: Payer: Self-pay | Admitting: Internal Medicine

## 2021-07-13 ENCOUNTER — Telehealth: Payer: Self-pay | Admitting: Cardiovascular Disease

## 2021-07-13 NOTE — Anesthesia Preprocedure Evaluation (Signed)
Anesthesia Evaluation  ?Patient identified by MRN, date of birth, ID band ?Patient awake ? ? ? ?Reviewed: ?Allergy & Precautions, H&P , NPO status , Patient's Chart, lab work & pertinent test results, reviewed documented beta blocker date and time  ? ?Airway ?Mallampati: II ? ? ?Neck ROM: full ? ? ? Dental ? ?(+) Poor Dentition ?  ?Pulmonary ?shortness of breath and with exertion, sleep apnea and Continuous Positive Airway Pressure Ventilation ,  ?  ?Pulmonary exam normal ? ? ? ? ? ? ? Cardiovascular ?Exercise Tolerance: Poor ?hypertension, On Medications ?+ angina with exertion + CAD, + Past MI and + DOE  ?Normal cardiovascular exam+ dysrhythmias  ?Rhythm:regular Rate:Normal ? ? ?  ?Neuro/Psych ? Headaches, negative psych ROS  ? GI/Hepatic ?Neg liver ROS, hiatal hernia, GERD  ,  ?Endo/Other  ?diabetes ? Renal/GU ?Renal disease  ?negative genitourinary ?  ?Musculoskeletal ? ? Abdominal ?  ?Peds ? Hematology ? ?(+) Blood dyscrasia, anemia ,   ?Anesthesia Other Findings ?Past Medical History: ?03/25/2016: CAD S/P PCI pRCA Promus DES 3.5 x 16 (4.1 mm), ostRPDA  ?Promus DES 2.5 x 12 (2.7 mm) ?No date: CAD S/P percutaneous coronary angioplasty ?    Comment:  a. 07/2015 MV: No ischemia, EF 66%;  b. 03/2016 Inflat  ?             STEMI/PCI: LM nl, LAD 25d, RI nl, LCX nl, OM1/2 nl, RCA  ?             80p (3.5x16 Promus Premier DES), 50p/m, 30d, RPDA 90  ?             (2.5x12 Promu Premier DES); c. 01/2018 Cath ([place]  ?             - [place], ): Patent RCA stents->Med Rx. ?No date: CKD (chronic kidney disease), stage III ([place]) ?No date: Diastolic dysfunction ?    Comment:  a. 10/2013 Echo: EF 55-65%, Gr1 DD; b. 01/2018 Echo ([place]  ?             [place], [place]): EF 55-60%, Gr2 DD, RVSP  ?             85mHg. ?No date: Diverticulitis ?No date: Dyspnea ?No date: Endometriosis ?No date: Family history of adverse reaction to anesthesia ?    Comment:  Mother - had to stay in ICU  because of breathing  ?             difficulties every time she had anesthesia ?No date: GERD (gastroesophageal reflux disease) ?No date: Hiatal hernia ?08/1994: History of colon polyps ?No date: History of Migraines ?No date: Hypercholesterolemia ?No date: Hypertension ?No date: LBBB (left bundle branch block) ?No date: Osteoporosis ?No date: PAF (paroxysmal atrial fibrillation) ([place] ?    Comment:  a. 03/2016 Dx @ time of MI; b. CHA2DS2VASc =  ?             5-->Eliquis. ?No date: Rheumatic fever ?No date: Sleep apnea ?    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    Comment:  ovaries not removed ?No date: APPENDECTOMY ?    Comment:  was removed during hysterectomy ?No date: Breast biopsies ?    Comment:  x2 ?"years ago": BREAST EXCISIONAL BIOPSY; Bilateral ?    Comment:  neg ?No date: BREAST SURGERY; Bilateral ?08/15/2019: BROW LIFT; Bilateral ?    Comment:  Procedure: BLEPHAROPLASTY UPPER EYELID; W/EXCESS SKIN  ?             BLEPHAROPTOSIS REPAIR; RESECT EX;  Surgeon: Vickki Muff Amy  ?             Jerilynn Mages, MD;  Location: [facility];  Service:  ?             Ophthalmology;  Laterality: Bilateral;  sleep apnea ?2011: CARDIAC CATHETERIZATION ?    Comment:  moderate 40% RCA disease ?01/2010: CARDIAC CATHETERIZATION ?    Comment:  Dr. Gollan'@[facility]'$ : Only noted 40% RCA ?03/25/2016: CARDIAC CATHETERIZATION; N/A ?    Comment:  Procedure: Left Heart Cath and Coronary Angiography;   ?             Surgeon: 04/07/2016, MD;  Location: [place] CV  ?             LAB;  Service: Cardiovascular;  Laterality: N/A; ?03/25/2016: CARDIAC CATHETERIZATION; N/A ?    Comment:  Procedure: Coronary Stent Intervention;  Surgeon:  04/07/2016  ?             Shanon Brow, MD;  Location: [place] CV LAB;  Service:  ?             Cardiovascular;  Laterality: N/A; ?2013: COLONOSCOPY ?07/11/2021: COLONOSCOPY WITH PROPOFOL; N/A ?    Comment:  Procedure: COLONOSCOPY WITH PROPOFOL;  Surgeon: 07/24/2021,  ?             Darren, MD;  Location: [place];  Service:  ?             Endoscopy;  Laterality: N/A; ?03/17/2015: ESOPHAGOGASTRODUODENOSCOPY (EGD) WITH PROPOFOL; N/A ?    Comment:  Procedure: ESOPHAGOGASTRODUODENOSCOPY (EGD) WITH  ?             PROPOFOL;  Surgeon: 03/30/2015, MD;  Location: ?             [facility] ENDOSCOPY;  Service: Gastroenterology;  Laterality:  ?             N/A; ?07/10/2021: ESOPHAGOGASTRODUODENOSCOPY (EGD) WITH PROPOFOL; N/A ?    Comment:  Procedure: ESOPHAGOGASTRODUODENOSCOPY (EGD) WITH  ?             PROPOFOL;  Surgeon: 16/03/2022, MD;  Location:  ?             [facility] ENDOSCOPY;  Service: Gastroenterology;  Laterality:  ?             N/A; ?07/11/2021: GIVENS CAPSULE STUDY; N/A ?    Comment:  Procedure: GIVENS CAPSULE STUDY;  Surgeon: 07/24/2021, ?             MD;  Location: [facility] ENDOSCOPY;  Service: Endoscopy;   ?             Laterality: N/A; ?07/2015: NM MYOVIEW ([place] [facility]) ?    Comment:  No evidence ischemia or infarction. EF 66%. Low risk ?No date: TONSILLECTOMY ?10/2013: TRANSTHORACIC ECHOCARDIOGRAM ?    Comment:  Normal LV size and function. EF 55-65%. GR 1 DD.  ?             Otherwise normal. ?BMI   ? Body Mass Index: 40.21 kg/m?  ?  ? Reproductive/Obstetrics ?negative OB ROS ? ?  ? ? ? ? ? ? ? ? ? ? ? ? ? ?  ?  ? ? ? ? ? ? ? ? ?  Anesthesia Physical ?Anesthesia Plan ? ?ASA: 4 ? ?Anesthesia Plan: General  ? ?Post-op Pain Management:   ? ?Induction:  ? ?PONV Risk Score and Plan:  ? ?Airway Management Planned:  ? ?Additional Equipment:  ? ?Intra-op Plan:  ? ?Post-operative Plan:  ? ?Informed Consent: I have reviewed the patients History and Physical, chart, labs and discussed the procedure including the risks, benefits  and alternatives for the proposed anesthesia with the patient or authorized representative who has indicated his/her understanding and acceptance.  ? ? ? ?Dental Advisory Given ? ?Plan Discussed with: CRNA ? ?Anesthesia Plan Comments:   ? ? ? ? ? ? ?Anesthesia Quick Evaluation ? ?

## 2021-07-13 NOTE — Telephone Encounter (Signed)
Return call to April [name], reiterated conversation from yesterday afternoon. Advised needs to be cleared by GI that she is okay to restart her Eliquis as she was admitted for GI bleed. Do not want to cus any further risk of bleeding with restarting Eliquis w/o GI consult.  ? ?Advised can have her GI provider send a secure message to Dr. Rockey Situ or Ignacia Bayley, NP regarding her Eliquis restart as well.  ? ?Otherwise all questions were address and no additional concerns at this time. April [name] thankful for the return call and advice. Agreeable to plan, will call back for anything further.   ? ?

## 2021-07-13 NOTE — Telephone Encounter (Signed)
Pt scheduled for 3/28 11am ?

## 2021-07-13 NOTE — Telephone Encounter (Signed)
Pt scheduled for 07/21/21 with Dr Allen [name] ?

## 2021-07-13 NOTE — Telephone Encounter (Signed)
Patient states that she had a colonoscopy on 07/11/2021 and on her paperwork it says that DR Allen [name] is wanting to see her in a week.  ? ?If the patient needs to be scheduled a week out let me know or call patient to schedule. ?

## 2021-07-13 NOTE — Telephone Encounter (Signed)
Patient calling was in Hospital was taken off of Eliquis on 07/07/21 still her next appt.  Has concerns if she can stay off till appt 3/28, please advise ?

## 2021-07-13 NOTE — Telephone Encounter (Signed)
Patient was release from hospital on 07/12/2021. She needs a one week follow up. At time of call no appointments  available. ?

## 2021-07-13 NOTE — Anesthesia Postprocedure Evaluation (Signed)
Anesthesia Post Note ? ?Patient: April [name] ? ?Procedure(s) Performed: ESOPHAGOGASTRODUODENOSCOPY (EGD) WITH PROPOFOL ? ?Patient location during evaluation: PACU ?Anesthesia Type: General ?Level of consciousness: awake and alert ?Pain management: pain level controlled ?Vital Signs Assessment: post-procedure vital signs reviewed and stable ?Respiratory status: spontaneous breathing, nonlabored ventilation, respiratory function stable and patient connected to nasal cannula oxygen ?Cardiovascular status: blood pressure returned to baseline and stable ?Postop Assessment: no apparent nausea or vomiting ?Anesthetic complications: no ? ? ?No notable events documented. ? ? ?Last Vitals:  ?Vitals:  ? 07/12/21 0749 07/12/21 0835  ?BP: (!) 127/48 (!) 118/54  ?Pulse: 63 72  ?Resp: 20   ?Temp: 36.9 ?C   ?SpO2: 99%   ?  ?Last Pain:  ?Vitals:  ? 07/12/21 0835  ?TempSrc:   ?PainSc: 0-No pain  ? ? ?  ?  ?  ?  ?  ?  ? ?Molli Barrows ? ? ? ? ?

## 2021-07-13 NOTE — Telephone Encounter (Signed)
Transition Care Management Follow-up Telephone Call ?Date of discharge and from where: 07/12/21 [facility] ?How have you been since you were released from the hospital? BM today, appears normal in color. Appetite is okay. Some bloating. Eating/drinking small amounts as tolerated. Shortness of breath when ambulating, paces self. Denies bleeding, N/V/D, ABD pain. Monitoring weight. No change. Leg wraps in use. Patient notes edema lower extremities worsening. She was holding Lasix since discharge. Nurse went over all medications with patient and advised taking Lasix as directed. Patient agrees to take first dose since discharge immediately after hanging up.  CPAP in use. Holding Eliquis and aspirin until seen by Cardiology per discharge note.  Reports she called Cardiology and was told to continue holding until after seen by GI. Follow up appointment scheduled with GI 3/23 and Cardiology 3/28. Plans to call Cardiology tomorrow morning to notify if any weight gain, worsening of shortness of breath or no improvement with edema after resuming lasix.  ?Any questions or concerns? No ? ?Items Reviewed: ?Did the pt receive and understand the discharge instructions provided? Yes . ?Medications obtained and verified? Yes .  ?Any new allergies since your discharge? No  ?Dietary orders reviewed? Yes, low sodium and heart healthy.  ?Do you have support at home? Yes  ? ?Home Care and Equipment/Supplies: ?Were home health services ordered? No ? ?Functional Questionnaire: (I = Independent and D = Dependent) ?ADLs: I ? ?Bathing/Dressing- I ? ?Meal Prep- I ? ?Eating- I ? ?Maintaining continence- managed with daily pad.  ? ?Transferring/Ambulation- cane ? ?Managing Meds- I ? ?Follow up appointments reviewed: ? ?PCP Hospital f/u appt confirmed? Yes  Scheduled to see PCP on 07/26/21 @ 11:00. ?Are transportation arrangements needed? No  ?If their condition worsens, is the pt aware to call PCP or go to the Emergency Dept.? Yes ?Was the patient  provided with contact information for the PCP's office or ED? Yes ?Was to pt encouraged to call back with questions or concerns? Yes  ?

## 2021-07-15 ENCOUNTER — Telehealth: Payer: Self-pay | Admitting: Internal Medicine

## 2021-07-15 NOTE — Telephone Encounter (Signed)
Patient called and would like to get a second opinion at [facility] called the [facility]. [phone]. She is requesting a referral. ?

## 2021-07-15 NOTE — Telephone Encounter (Signed)
Pt called stating she wanted the cma to give her a call because she does not know if she want to keep the 3/28 appointment because she is going somewhere else to get some of this fluid off of her leg ?

## 2021-07-18 ENCOUNTER — Ambulatory Visit
Admission: RE | Admit: 2021-07-18 | Discharge: 2021-07-18 | Disposition: A | Discharge status: Home / Self Care | Payer: Medicare Other | Source: Ambulatory Visit | Attending: Internal Medicine | Admitting: Internal Medicine

## 2021-07-18 ENCOUNTER — Other Ambulatory Visit: Payer: Self-pay

## 2021-07-18 DIAGNOSIS — Z1231 Encounter for screening mammogram for malignant neoplasm of breast: Secondary | ICD-10-CM | POA: Diagnosis not present

## 2021-07-18 NOTE — Telephone Encounter (Signed)
Patient would like to keep 3/28 appt ? ?

## 2021-07-20 NOTE — Telephone Encounter (Signed)
Notified patient that I will reach out to [facility] ger[facility] clinic to confirm exactly what is needed and if they can see her for vascular. ?

## 2021-07-20 NOTE — Telephone Encounter (Signed)
Pt called in with a fax number for clinic [phone] ?

## 2021-07-21 ENCOUNTER — Other Ambulatory Visit: Payer: Self-pay

## 2021-07-21 ENCOUNTER — Other Ambulatory Visit: Payer: Self-pay | Admitting: Gastroenterology

## 2021-07-21 ENCOUNTER — Ambulatory Visit (INDEPENDENT_AMBULATORY_CARE_PROVIDER_SITE_OTHER): Payer: Medicare Other | Admitting: Gastroenterology

## 2021-07-21 ENCOUNTER — Encounter: Payer: Self-pay | Admitting: Gastroenterology

## 2021-07-21 VITALS — BP 159/77 | HR 108 | Temp 97.9°F | Ht 60.0 in | Wt 203.0 lb

## 2021-07-21 DIAGNOSIS — D649 Anemia, unspecified: Secondary | ICD-10-CM | POA: Diagnosis not present

## 2021-07-21 DIAGNOSIS — I25110 Atherosclerotic heart disease of native coronary artery with unstable angina pectoris: Secondary | ICD-10-CM

## 2021-07-21 NOTE — Progress Notes (Signed)
? ? ?Primary Care Physician: Einar Pheasant, MD ? ?Primary Gastroenterologist:  Dr. Lucilla Lame ? ?Chief Complaint  ?Patient presents with  ? New Patient (Initial Visit)  ? Hospitalization Follow-up  ? ? ?HPI: April [name] is a 81 y.o. female here for follow-up after being discharged from the hospital.  The patient was admitted with melena and anemia and had an upper endoscopy by Dr. Marius Ditch and then a colonoscopy by me.  The patient had multiple polyps seen on the colonoscopy that were adenomatous polyps.  The patient is a patient of [facility] and follows up with Dr. Virgina Jock.  We had seen her in the hospital on call and a consult was done by Dr. Vicente Males.  The patient's most recent trends in her hemoglobin have shown: ? ?Component ?    Latest Ref Rng 01/20/2021 07/07/2021  ?Hemoglobin ?    12.0 - 15.0 g/dL 12.9  8.6 Repeated and verified X2. (L)   ?HCT ?    36.0 - 46.0 % 39.4  26.1 (L)   ?MCV ?    80.0 - 100.0 fL 96.6  96.8   ? ?Component ?    Latest Ref Rng 07/08/2021 07/09/2021 07/10/2021 07/11/2021  ?Hemoglobin ?    12.0 - 15.0 g/dL 9.9 (L)  8.8 (L)  9.3 (L)  9.4 (L)   ?Hemoglobin ?     7.2 (L)      ?HCT ?    36.0 - 46.0 % 30.7 (L)  26.9 (L)  28.9 (L)  29.3 (L)   ?HCT ?     22.6 (L)      ?MCV ?    80.0 - 100.0 fL 98.3  94.4  94.4  95.8   ?  ?It appears that the patient had a blood transfusion earlier this month. She had 2 units in the hospital. ? ?The patient reports that she wants to switch over to our practice.  The patient states she had 1 day of some dark stools but does not reported to have been dark like previously seen.  The patient denies any bright red blood per rectum abdominal pain nausea vomiting fevers or chills. ? ?Past Medical History:  ?Diagnosis Date  ? CAD S/P PCI pRCA Promus DES 3.5 x 16 (4.1 mm), ostRPDA Promus DES 2.5 x 12 (2.7 mm) 03/25/2016  ? CAD S/P percutaneous coronary angioplasty   ? a. 07/2015 MV: No ischemia, EF 66%;  b. 03/2016 Inflat STEMI/PCI: LM nl, LAD 25d, RI nl, LCX nl, OM1/2  nl, RCA 80p (3.5x16 Promus Premier DES), 50p/m, 30d, RPDA 90 (2.5x12 Promu Premier DES); c. 01/2018 Cath ([place], [place]): Patent RCA stents->Med Rx.  ? CKD (chronic kidney disease), stage III ([place])   ? Diastolic dysfunction   ? a. 10/2013 Echo: EF 55-65%, Gr1 DD; b. 01/2018 Echo ([place], [place]): EF 55-60%, Gr2 DD, RVSP 71mHg.  ? Diverticulitis   ? Dyspnea   ? Endometriosis   ? Family history of adverse reaction to anesthesia   ? Mother - had to stay in ICU because of breathing difficulties every time she had anesthesia  ? GERD (gastroesophageal reflux disease)   ? Hiatal hernia   ? History of colon polyps 08/1994  ? History of Migraines   ? Hypercholesterolemia   ? Hypertension   ? LBBB (left bundle branch block)   ? Osteoporosis   ? [place]   ? a. 03/2016 Dx @ time of MI; b.  CHA2DS2VASc = 5-->Eliquis.  ? Rheumatic fever   ? Sleep apnea   ? CPAP  ? Spasmodic dysphonia   ? ST elevation myocardial infarction (STEMI) of inferolateral wall, initial episode of care [facility]) 03/25/2016  ? ST elevation myocardial infarction (STEMI) of inferolateral wall, initial episode of care [facility]) 03/25/2016  ? Urine incontinence   ? ? ?Current Outpatient Medications  ?Medication Sig Dispense Refill  ? acetaminophen (TYLENOL) 500 MG tablet Take 500 mg by mouth daily as needed for headache (pain).    ? albuterol (PROVENTIL HFA;VENTOLIN HFA) 108 (90 Base) MCG/ACT inhaler Inhale 2 puffs into the lungs every 6 (six) hours as needed for wheezing or shortness of breath. 1 Inhaler 6  ? amiodarone (PACERONE) 200 MG tablet TAKE 1 TABLET BY MOUTH DAILY 90 tablet 0  ? fluticasone-salmeterol (ADVAIR HFA) 230-21 MCG/ACT inhaler Inhale 2 puffs into the lungs 2 (two) times daily. 1 each 12  ? furosemide (LASIX) 40 MG tablet Take 1 tablet (40 mg total) by mouth 2 (two) times daily. Dr. Rockey Situ advised alternate 40 mg twice a day with 40 mg daily every other day 180 tablet 3  ? hydrALAZINE  (APRESOLINE) 10 MG tablet TAKE ONE TABLET BY MOUTH THREE TIMES A DAY 270 tablet 1  ? losartan (COZAAR) 100 MG tablet Take 1 tablet (100 mg total) by mouth daily. Please hold until you are PCP visit 90 tablet 1  ? Multiple Vitamin (MULTIVITAMIN WITH MINERALS) TABS tablet Take 0.5 tablets by mouth 2 (two) times daily.    ? nitroGLYCERIN (NITROSTAT) 0.4 MG SL tablet Place 1 tablet (0.4 mg total) under the tongue every 5 (five) minutes as needed. 25 tablet 0  ? pantoprazole (PROTONIX) 40 MG tablet Take 1 tablet (40 mg total) by mouth daily. 30 tablet 5  ? potassium chloride (KLOR-CON) 10 MEQ tablet TAKE 1 TABLET BY MOUTH TWICE A DAY 180 tablet 0  ? rosuvastatin (CRESTOR) 40 MG tablet TAKE 1 TABLET BY MOUTH DAILY 90 tablet 0  ? sucralfate (CARAFATE) 1 g tablet Take 1 g by mouth 2 (two) times daily.    ? apixaban (ELIQUIS) 5 MG TABS tablet Take 1 tablet (5 mg total) by mouth 2 (two) times daily. Hold until you see your cardiologist (Patient not taking: Reported on 07/21/2021) 180 tablet 1  ? aspirin EC 81 MG tablet Take 1 tablet (81 mg total) by mouth daily. Please hold until you see your cardiologist (Patient not taking: Reported on 07/21/2021) 90 tablet 3  ? Azelastine HCl 137 MCG/SPRAY SOLN USE 1 TO 2 SPRAYS IN EACH NOSTRIL TWICE A DAY AS NEEDED FOR DRAINAGE (Patient not taking: Reported on 07/21/2021) 90 mL 0  ? cholecalciferol (VITAMIN D) 1000 units tablet Take 1,000 Units by mouth 2 (two) times daily. (Patient not taking: Reported on 07/21/2021)    ? fish oil-omega-3 fatty acids 1000 MG capsule Take 1 g by mouth 2 (two) times daily.  (Patient not taking: Reported on 07/21/2021)    ? Misc Natural Products (OSTEO BI-FLEX JOINT SHIELD PO) Take 1 tablet by mouth 2 (two) times daily.  (Patient not taking: Reported on 07/21/2021)    ? Polyethyl Glycol-Propyl Glycol (SYSTANE OP) Place 1 drop into both eyes daily as needed (dry eyes). (Patient not taking: Reported on 07/21/2021)    ? ?No current facility-administered medications  for this visit.  ? ? ?Allergies as of 07/21/2021 - Review Complete 07/21/2021  ?Allergen Reaction Noted  ? Penicillins Other (See Comments) 10/11/2012  ? Sulfa antibiotics  Rash 07/08/2012  ? ? ?ROS: ? ?General: Negative for anorexia, weight loss, fever, chills, fatigue, weakness. ?ENT: Negative for hoarseness, difficulty swallowing , nasal congestion. ?CV: Negative for chest pain, angina, palpitations, dyspnea on exertion, peripheral edema.  ?Respiratory: Negative for dyspnea at rest, dyspnea on exertion, cough, sputum, wheezing.  ?GI: See history of present illness. ?GU:  Negative for dysuria, hematuria, urinary incontinence, urinary frequency, nocturnal urination.  ?Endo: Negative for unusual weight change.  ?  ?Physical Examination: ? ? BP (!) 159/77   Pulse (!) 108   Temp 97.9 ?F (36.6 ?C) (Oral)   Ht 5' (1.524 m)   Wt 203 lb (92.1 kg)   LMP 05/01/1972   BMI 39.65 kg/m?  ? ?General: Well-nourished, well-developed in no acute distress.  ?Eyes: No icterus. Conjunctivae pink. ?Neuro: Alert and oriented x 3.  Grossly intact. ?Skin: Warm and dry, no jaundice.   ?Psych: Alert and cooperative, normal mood and affect. ? ?Labs:  ?  ?Imaging Studies: ?DG Chest 2 View ? ?Result Date: 07/06/2021 ?CLINICAL DATA:  Shortness of breath and dyspnea on exertion. EXAM: CHEST - 2 VIEW COMPARISON:  November 11, 2016 FINDINGS: The heart size and mediastinal contours are within normal limits. There is a moderate-sized hiatal hernia. Mild, stable scarring is seen within the left lung base. Both lungs are otherwise clear. Multilevel degenerative changes seen throughout the thoracic spine. IMPRESSION: 1. Stable left basilar scarring without acute or active cardiopulmonary disease. 2. Moderate-sized hiatal hernia. Electronically Signed   By: Virgina Norfolk M.D.   On: 07/06/2021 20:54  ? ?MM 3D SCREEN BREAST BILATERAL ? ?Result Date: 07/19/2021 ?CLINICAL DATA:  Screening. EXAM: DIGITAL SCREENING BILATERAL MAMMOGRAM WITH TOMOSYNTHESIS AND  CAD TECHNIQUE: Bilateral screening digital craniocaudal and mediolateral oblique mammograms were obtained. Bilateral screening digital breast tomosynthesis was performed. The images were evaluated with computer

## 2021-07-22 LAB — CBC
Hematocrit: 31.9 % — ABNORMAL LOW (ref 34.0–46.6)
Hemoglobin: 10.7 g/dL — ABNORMAL LOW (ref 11.1–15.9)
MCH: 30.9 pg (ref 26.6–33.0)
MCHC: 33.5 g/dL (ref 31.5–35.7)
MCV: 92 fL (ref 79–97)
Platelets: 206 10*3/uL (ref 150–450)
RBC: 3.46 x10E6/uL — ABNORMAL LOW (ref 3.77–5.28)
RDW: 13.4 % (ref 11.7–15.4)
WBC: 6.6 10*3/uL (ref 3.4–10.8)

## 2021-07-25 NOTE — Telephone Encounter (Signed)
Pt called in requesting for copy of referral... Pt stated that Dr. Nicki Reaper referred her to [facility]... Pt stated that her appt at [facility] is on 07/26/2021 at 11am... Pt requesting callback   ?

## 2021-07-26 ENCOUNTER — Ambulatory Visit (INDEPENDENT_AMBULATORY_CARE_PROVIDER_SITE_OTHER): Payer: Medicare Other | Admitting: Internal Medicine

## 2021-07-26 ENCOUNTER — Other Ambulatory Visit: Payer: Self-pay | Admitting: Cardiovascular Disease

## 2021-07-26 ENCOUNTER — Other Ambulatory Visit: Payer: Self-pay

## 2021-07-26 ENCOUNTER — Telehealth: Payer: Self-pay | Admitting: Nurse Practitioner

## 2021-07-26 ENCOUNTER — Encounter: Payer: Self-pay | Admitting: Nurse Practitioner

## 2021-07-26 ENCOUNTER — Ambulatory Visit (INDEPENDENT_AMBULATORY_CARE_PROVIDER_SITE_OTHER): Payer: Medicare Other | Admitting: Nurse Practitioner

## 2021-07-26 ENCOUNTER — Encounter: Payer: Self-pay | Admitting: Internal Medicine

## 2021-07-26 VITALS — BP 140/66 | HR 82 | Ht 59.0 in | Wt 205.0 lb

## 2021-07-26 VITALS — BP 120/62 | HR 64 | Temp 98.3°F | Ht 60.0 in | Wt 205.0 lb

## 2021-07-26 DIAGNOSIS — I5032 Chronic diastolic (congestive) heart failure: Secondary | ICD-10-CM

## 2021-07-26 DIAGNOSIS — R0602 Shortness of breath: Secondary | ICD-10-CM

## 2021-07-26 DIAGNOSIS — N183 Chronic kidney disease, stage 3 unspecified: Secondary | ICD-10-CM

## 2021-07-26 DIAGNOSIS — I480 Paroxysmal atrial fibrillation: Secondary | ICD-10-CM

## 2021-07-26 DIAGNOSIS — J449 Chronic obstructive pulmonary disease, unspecified: Secondary | ICD-10-CM

## 2021-07-26 DIAGNOSIS — D509 Iron deficiency anemia, unspecified: Secondary | ICD-10-CM | POA: Diagnosis not present

## 2021-07-26 DIAGNOSIS — R600 Localized edema: Secondary | ICD-10-CM | POA: Diagnosis not present

## 2021-07-26 DIAGNOSIS — I25110 Atherosclerotic heart disease of native coronary artery with unstable angina pectoris: Secondary | ICD-10-CM | POA: Diagnosis not present

## 2021-07-26 DIAGNOSIS — D6859 Other primary thrombophilia: Secondary | ICD-10-CM

## 2021-07-26 DIAGNOSIS — I2510 Atherosclerotic heart disease of native coronary artery without angina pectoris: Secondary | ICD-10-CM

## 2021-07-26 DIAGNOSIS — I10 Essential (primary) hypertension: Secondary | ICD-10-CM

## 2021-07-26 DIAGNOSIS — I5033 Acute on chronic diastolic (congestive) heart failure: Secondary | ICD-10-CM

## 2021-07-26 DIAGNOSIS — L03119 Cellulitis of unspecified part of limb: Secondary | ICD-10-CM | POA: Diagnosis not present

## 2021-07-26 DIAGNOSIS — E7800 Pure hypercholesterolemia, unspecified: Secondary | ICD-10-CM

## 2021-07-26 DIAGNOSIS — Z9861 Coronary angioplasty status: Secondary | ICD-10-CM

## 2021-07-26 DIAGNOSIS — I700 Atherosclerosis of aorta: Secondary | ICD-10-CM | POA: Diagnosis not present

## 2021-07-26 DIAGNOSIS — G4733 Obstructive sleep apnea (adult) (pediatric): Secondary | ICD-10-CM

## 2021-07-26 DIAGNOSIS — K219 Gastro-esophageal reflux disease without esophagitis: Secondary | ICD-10-CM

## 2021-07-26 DIAGNOSIS — Z860100 Personal history of colon polyps, unspecified: Secondary | ICD-10-CM

## 2021-07-26 DIAGNOSIS — I890 Lymphedema, not elsewhere classified: Secondary | ICD-10-CM | POA: Diagnosis not present

## 2021-07-26 DIAGNOSIS — D649 Anemia, unspecified: Secondary | ICD-10-CM | POA: Diagnosis not present

## 2021-07-26 DIAGNOSIS — K922 Gastrointestinal hemorrhage, unspecified: Secondary | ICD-10-CM

## 2021-07-26 DIAGNOSIS — E785 Hyperlipidemia, unspecified: Secondary | ICD-10-CM

## 2021-07-26 DIAGNOSIS — E1159 Type 2 diabetes mellitus with other circulatory complications: Secondary | ICD-10-CM

## 2021-07-26 MED ORDER — APIXABAN 5 MG PO TABS: 5.0000 mg | ORAL_TABLET | Freq: Two times a day (BID) | ORAL | 1 refills | Status: DC

## 2021-07-26 MED ORDER — AMOXICILLIN 875 MG PO TABS: 875.0000 mg | ORAL_TABLET | Freq: Two times a day (BID) | ORAL | 0 refills | Status: AC

## 2021-07-26 MED ORDER — ASPIRIN EC 81 MG PO TBEC: 81.0000 mg | DELAYED_RELEASE_TABLET | Freq: Every day | ORAL | 3 refills | Status: DC

## 2021-07-26 MED ORDER — DOXYCYCLINE HYCLATE 100 MG PO TBEC
100.0000 mg | DELAYED_RELEASE_TABLET | Freq: Two times a day (BID) | ORAL | 0 refills | Status: AC
Start: 2021-07-26 — End: 2021-08-02

## 2021-07-26 NOTE — Patient Instructions (Signed)
Medication Instructions:  ?Your physician has recommended you make the following change in your medication:  ? ?RESTART Aspirin & Eliquis ?INCREASE Furosemide (Lasix) to 80 mg twice a day for 3 days THEN go back to 40 mg twice a day ?START Amoxicillin 875 mg twice a day for 7 days ?START Doxycyline 100 mg twice a day for 7 days   ? ?*If you need a refill on your cardiac medications before your next appointment, please call your pharmacy* ? ? ?Lab Work: ?None ? ?If you have labs (blood work) drawn today and your tests are completely normal, you will receive your results only by: ?MyChart Message (if you have MyChart) OR ?A paper copy in the mail ?If you have any lab test that is abnormal or we need to change your treatment, we will call you to review the results. ? ? ?Testing/Procedures: ?None ? ? ?Follow-Up: ?At [facility], you and your health needs are our priority.  As part of our continuing mission to provide you with exceptional heart care, we have created designated Provider Care Teams.  These Care Teams include your primary Cardiologist (physician) and Advanced Practice Providers (APPs -  Physician Assistants and Nurse Practitioners) who all work together to provide you with the care you need, when you need it. ? ?We recommend signing up for the patient portal called "MyChart".  Sign up information is provided on this After Visit Summary.  MyChart is used to connect with patients for Virtual Visits (Telemedicine).  Patients are able to view lab/test results, encounter notes, upcoming appointments, etc.  Non-urgent messages can be sent to your provider as well.   ?To learn more about what you can do with MyChart, go to [email or web address].   ? ?Your next appointment:   ?Monday August 01, 2021 at 3:20 pm with Dr. Rockey Situ ? ?The format for your next appointment:   ?In Person ? ?Provider:   ?Ida Rogue, MD ?

## 2021-07-26 NOTE — Telephone Encounter (Signed)
Patient called in because she tried to get refill of her propranolol and it was denied due to it being discontinued. Inquired how frequent she was taking and she states 20 mg three times a day. She stated that Dr. Rockey Situ started her on this and she knew the hospitalist discontinued but she did not hear that from Dr. Rockey Situ. So she has continued that medication until now. Advised that I would send this message over to provider she saw today and will give her a call back with further instructions. She verbalized understanding with no further questions at this time.  ?

## 2021-07-26 NOTE — Telephone Encounter (Signed)
Pt c/o medication issue: ? ?1. Name of Medication: propanolol  ? ?2. How are you currently taking this medication (dosage and times per day)? unknown ? ?3. Are you having a reaction (difficulty breathing--STAT)? no ? ?4. What is your medication issue? Pharmacy denied patient refill and told her it was discontinued  ? ?

## 2021-07-26 NOTE — Progress Notes (Signed)
? ? ?Office Visit  ?  ?Patient Name: April [name] ?Date of Encounter: 07/26/2021 ? ?Primary Care Provider:  Einar Pheasant, [name] ?Primary Cardiologist:  April Rogue, [name] ? ?Chief Complaint  ?  ?81 year old female with a history of CAD status post prior RCA and PDA stenting, HFpEF, hypertension, hyperlipidemia, paroxysmal atrial fibrillation on Eliquis and amiodarone, obesity, stage III chronic kidney disease, anemia, and sleep apnea, who presents for follow-up related to acute HFpEF and lower extremity cellulitis following hospitalization for GI bleed. ? ?Past Medical History  ?  ?Past Medical History:  ?Diagnosis Date  ? Anemia   ? CAD S/P PCI pRCA Promus DES 3.5 x 16 (4.1 mm), ostRPDA Promus DES 2.5 x 12 (2.7 mm) 03/25/2016  ? CAD S/P percutaneous coronary angioplasty   ? a. 07/2015 MV: No ischemia, EF 66%;  b. 03/2016 Inflat STEMI/PCI: LM nl, LAD 25d, RI nl, LCX nl, OM1/2 nl, RCA 80p (3.5x16 Promus Premier DES), 50p/m, 30d, RPDA 90 (2.5x12 Promu Premier DES); c. 01/2018 Cath ([place], [place]): Patent RCA stents->Med Rx.  ? CKD (chronic kidney disease), stage III ([place])   ? Diastolic dysfunction   ? a. 10/2013 Echo: EF 55-65%, Gr1 DD; b. 01/2018 Echo ([place], [place]): EF 55-60%, Gr2 DD, RVSP 22mHg.  ? Diverticulitis   ? Dyspnea   ? Endometriosis   ? Family history of adverse reaction to anesthesia   ? Mother - had to stay in ICU because of breathing difficulties every time she had anesthesia  ? GERD (gastroesophageal reflux disease)   ? GI bleed   ? a. 06/2021 - melena-->2 u PRBCs.  ? Hiatal hernia   ? History of colon polyps 08/1994  ? History of Migraines   ? Hypercholesterolemia   ? Hypertension   ? LBBB (left bundle branch block)   ? Osteoporosis   ? [place]   ? a. 03/2016 Dx @ time of MI; b. CHA2DS2VASc = 5-->Eliquis.  ? Rheumatic fever   ? Sleep apnea   ? CPAP  ? Spasmodic dysphonia   ? ST elevation myocardial infarction (STEMI) of inferolateral  wall, initial episode of [facility] ([facility] 03/25/2016  ? ST elevation myocardial infarction (STEMI) of inferolateral wall, initial episode of care ([facility] 03/25/2016  ? Urine incontinence   ? ?Past Surgical History:  ?Procedure Laterality Date  ? ABDOMINAL HYSTERECTOMY    ? ovaries not removed  ? APPENDECTOMY    ? was removed during hysterectomy  ? Breast biopsies    ? x2  ? BREAST EXCISIONAL BIOPSY Bilateral "years ago"  ? neg  ? BREAST SURGERY Bilateral   ? BROW LIFT Bilateral 08/15/2019  ? Procedure: BLEPHAROPLASTY UPPER EYELID; W/EXCESS SKIN BLEPHAROPTOSIS REPAIR; RESECT EX;  Surgeon: April Starch [name];  [facility]  Service: Ophthalmology;  Laterality: Bilateral;  sleep apnea  ? CARDIAC CATHETERIZATION  2011  ? moderate 40% RCA disease  ? CARDIAC CATHETERIZATION  01/2010  ? Dr. Gollan'@[facility]'$ : Only noted 40% RCA  ? CARDIAC CATHETERIZATION N/A 03/25/2016  ? Procedure: Left Heart Cath and Coronary Angiography;  Surgeon: April [name];  [facility];  Service: Cardiovascular;  Laterality: N/A;  ? CARDIAC CATHETERIZATION N/A 03/25/2016  ? Procedure: Coronary Stent Intervention;  Surgeon: April [name];  [facility];  Service: Cardiovascular;  Laterality: N/A;  ? COLONOSCOPY  2013  ? COLONOSCOPY WITH PROPOFOL N/A 07/11/2021  ? Procedure: COLONOSCOPY WITH PROPOFOL;  Surgeon: April Lame, [name];  Location[facility] ENDOSCOPY;  Service: Endoscopy;  Laterality: N/A;  ? ESOPHAGOGASTRODUODENOSCOPY (EGD) WITH PROPOFOL N/A 03/17/2015  ? Procedure: ESOPHAGOGASTRODUODENOSCOPY (EGD) WITH PROPOFOL;  Surgeon: April Lye, [name];  Location: [facility] ENDOSCOPY;  Service: Gastroenterology;  Laterality: N/A;  ? ESOPHAGOGASTRODUODENOSCOPY (EGD) WITH PROPOFOL N/A 07/10/2021  ? Procedure: ESOPHAGOGASTRODUODENOSCOPY (EGD) WITH PROPOFOL;  Surgeon: April Landsman, [name];  Location: [facility] ENDOSCOPY;  Service: Gastroenterology;  Laterality: N/A;  ? GIVENS CAPSULE STUDY N/A 07/11/2021  ?  Procedure: GIVENS CAPSULE STUDY;  Surgeon: April Lame, [name];  [facility]COPY;  Service: Endoscopy;  Laterality: N/A;  ? NM MYOVIEW ([place] [facility])  07/2015  ? No evidence ischemia or infarction. EF 66%. Low risk  ? TONSILLECTOMY    ? TRANSTHORACIC ECHOCARDIOGRAM  10/2013  ? Normal LV size and function. EF 55-65%. GR 1 DD. Otherwise normal.  ? ? ?Allergies ? ?Allergies  ?Allergen Reactions  ? Penicillins Other (See Comments)  ?  Okay to take amoxicillin/(pt does not recall what the reaction to penicillin was (64-62 years old) ?  ? Sulfa Antibiotics Rash  ? ? ?History of Present Illness  ?  ?81 year old female with above complex past medical history including CAD status post inferolateral STEMI in November 2017, with stenting of the RCA and PDA at that time.  She also has a history of hypertension, hyperlipidemia, HFpEF, paroxysmal atrial fibrillation on Eliquis and amiodarone, obesity, anemia, stage III chronic kidney disease, and sleep apnea.  In October 2019, she was admitted to [facility] in [place], with substernal chest discomfort and mild troponin elevation.  Echo showed normal LV function and grade 2 diastolic dysfunction.  Diagnostic catheterization showed patent RCA stents and otherwise no targets for intervention.  Over the years, she has had issues with chronic lower extremity swelling and lymphedema, for which she has been using wraps and lymphedema pumps.  She was last seen in cardiology clinic in November 2022,, at which time she was noted to have an increase in lower extremity swelling-she was no longer using wraps.  She was advised to increase her Lasix to 40 mg twice daily for 1 week. ? ?[name] was recently admitted to [facility] between March 9 and March 14 with fatigue, dyspnea, melena, and finding of anemia and acute kidney injury on outpatient labs.  During admission, she complained of dizziness with a drop in hemoglobin to 7.2.  She was treated with 2 units of packed red blood  cells and Eliquis was held.  EGD was performed and showed mildly erythematous gastric mucosa without obvious bleeding.  This was followed by colonoscopy which showed no source of bleeding, polyps (pathology negative for malignancy), nonbleeding internal hemorrhoids, and diverticulosis.  Capsule endoscopy was subsequently carried out.  EGD biopsies were negative for H. pylori.  She was discharged home off of Eliquis with recommendation to follow-up with cardiology.  She was seen by GI on March 23, at which time H&H were stable.  Notes indicate that she was cleared to resume anticoagulation however, today, she says she has yet to resume.  Her biggest complaint today, is that of increasing lower extremity swelling with dyspnea on exertion, 10 pound weight gain and increasing abdominal girth.  She notes that at hospital discharge on March 14, she could barely get her shoes on because her legs are so swollen.  I have reviewed her hospital records through patient station and note that she was net positive throughout the admission in the setting of IV fluids,  packed red blood cells, and holding of diuretic therapy because of acute kidney injury.  Since discharge, she has been taking Lasix 40 mg daily alternating with 40 mg twice daily, without significant improvement in swelling, which involves her posterior thighs and abdominal flanks.  She has also noted significant bilateral lower extremity erythema and tenderness without drainage, and is concerned that she is developing cellulitis.  She denies chest pain, palpitations, PND, orthopnea, dizziness, syncope, or early satiety. ? ?Home Medications  ?  ?Current Outpatient Medications  ?Medication Sig Dispense Refill  ? acetaminophen (TYLENOL) 500 MG tablet Take 500 mg by mouth daily as needed for headache (pain).    ? albuterol (PROVENTIL HFA;VENTOLIN HFA) 108 (90 Base) MCG/ACT inhaler Inhale 2 puffs into the lungs every 6 (six) hours as needed for wheezing or shortness of  breath. 1 Inhaler 6  ? amiodarone (PACERONE) 200 MG tablet TAKE 1 TABLET BY MOUTH DAILY 90 tablet 0  ? Azelastine HCl 137 MCG/SPRAY SOLN USE 1 TO 2 SPRAYS IN EACH NOSTRIL TWICE A DAY AS NEEDED FOR DRAINAGE 90 mL 0

## 2021-07-26 NOTE — Telephone Encounter (Signed)
Spoke with Dawn at Pharmacy and she reviewed medication sent in is not in stock and cost prohibitive. We reviewed other options for this medication and provided her with verbal orders for doxycycline hyclate 100 mg twice a day for 7 days with no refills.  ?

## 2021-07-26 NOTE — Progress Notes (Signed)
Patient ID: April [name], female   DOB: 1941-03-06, 81 y.o.   MRN: [id number] ? ? ?Subjective:  ? ? Patient ID: April [name], female    DOB: [birth year], 81 y.o.   MRN: [id number] ? ?This visit occurred during the SARS-CoV-2 public health emergency.  Safety protocols were in place, including screening questions prior to the visit, additional usage of staff PPE, and extensive cleaning of exam room while observing appropriate contact time as indicated for disinfecting solutions.  ? ?Patient here for hospital follow up.  ? ?Chief Complaint  ?Patient presents with  ? Follow-up  ?  Hospital Follow up  ? .  ? ?HPI ?Appt scheduled for hospital follow up.  Was admitted 07/07/21 - 07/12/21 - with fatigue, dyspnea, melena and anemia with acute kidney injury.  Hgb - dropped - 7.2.  transfused 2 units of blood.  Eliquis held.  EGD - mildly erythematous gastric mucosa without obvious bleeding.  Colonoscopy - no source of bleeding - diverticulosis, polyps and non bleeding internal hemorrhoids.  Discharged to f/u as outpt with GI.  Saw GI 07/21/21 - hemoglobin stable.  Ok'd to resume eliquis.  Saw cardiology earlier today.  Main complaint is lower extremity swelling.  Still with sob with exertion.  Weight gain.  Has been on lasix.  Per cardiology visit today, was instructed to increase lasix to '80mg'$  bid x 3 days and the resume '40mg'$  bid.  Plan for f/u next week.  Also concern regarding possible cellulitis.  Prescribed amoxicillin and doxycycline.  No chest pain. She is eating.  No vomiting.  No further bleeding.  She is interested in PT at [place].  Also, request referral /f/u with Dr Laural Roes - regarding her breathing.   ? ? ?Past Medical History:  ?Diagnosis Date  ? Anemia   ? CAD S/P PCI pRCA Promus DES 3.5 x 16 (4.1 mm), ostRPDA Promus DES 2.5 x 12 (2.7 mm) 03/25/2016  ? CAD S/P percutaneous coronary angioplasty   ? a. 07/2015 MV: No ischemia, EF 66%;  b. 03/2016 [place] STEMI/PCI: LM nl, LAD 25d, RI nl, LCX nl, OM1/2 nl, RCA 80p  (3.5x16 Promus Premier DES), 50p/m, 30d, RPDA 90 (2.5x12 Promu Premier DES); c. 01/2018 Cath ([place], [place]): Patent RCA stents->Med Rx.  ? CKD (chronic kidney disease), stage III ([place])   ? Diastolic dysfunction   ? a. 10/2013 Echo: EF 55-65%, Gr1 DD; b. 01/2018 Echo ([place], [place]): EF 55-60%, Gr2 DD, RVSP 65mHg.  ? Diverticulitis   ? Dyspnea   ? Endometriosis   ? Family history of adverse reaction to anesthesia   ? Mother - had to stay in ICU because of breathing difficulties every time she had anesthesia  ? GERD (gastroesophageal reflux disease)   ? GI bleed   ? a. 06/2021 - melena-->2 u PRBCs.  ? Hiatal hernia   ? History of colon polyps 08/1994  ? History of Migraines   ? Hypercholesterolemia   ? Hypertension   ? LBBB (left bundle branch block)   ? Osteoporosis   ? PAF (paroxysmal [place] fibrillation) ([place]   ? a. 03/2016 Dx @ time of MI; b. CHA2DS2VASc = 5-->Eliquis.  ? Rheumatic fever   ? Sleep apnea   ? CPAP  ? Spasmodic dysphonia   ? ST elevation myocardial infarction (STEMI) of inferolateral wall, initial episode of care ([facility] 03/25/2016  ? ST elevation myocardial infarction (STEMI) of inferolateral wall, initial episode of care ([facility] 03/25/2016  ? Urine  incontinence   ? ?Past Surgical History:  ?Procedure Laterality Date  ? ABDOMINAL HYSTERECTOMY    ? ovaries not removed  ? APPENDECTOMY    ? was removed during hysterectomy  ? Breast biopsies    ? x2  ? BREAST EXCISIONAL BIOPSY Bilateral "years ago"  ? neg  ? BREAST SURGERY Bilateral   ? BROW LIFT Bilateral 08/15/2019  ? Procedure: BLEPHAROPLASTY UPPER EYELID; W/EXCESS SKIN BLEPHAROPTOSIS REPAIR; RESECT EX;  Surgeon: Karle Starch, MD;  Location: [place];  Service: Ophthalmology;  Laterality: Bilateral;  sleep apnea  ? CARDIAC CATHETERIZATION  2011  ? moderate 40% RCA disease  ? CARDIAC CATHETERIZATION  01/2010  ? Dr. Gollan'@[facility]'$ : Only noted 40% RCA  ? CARDIAC CATHETERIZATION N/A 03/25/2016  ? Procedure: Left Heart  Cath and Coronary Angiography;  Surgeon: 04/07/2016, MD;  Location: [place] CV LAB;  Service: Cardiovascular;  Laterality: N/A;  ? CARDIAC CATHETERIZATION N/A 03/25/2016  ? Procedure: Coronary Stent Intervention;  Surgeon: 04/07/2016, MD;  Location: [place] CV LAB;  Service: Cardiovascular;  Laterality: N/A;  ? COLONOSCOPY  2013  ? COLONOSCOPY WITH PROPOFOL N/A 07/11/2021  ? Procedure: COLONOSCOPY WITH PROPOFOL;  Surgeon: 07/24/2021, MD;  Location: [facility] ENDOSCOPY;  Service: Endoscopy;  Laterality: N/A;  ? ESOPHAGOGASTRODUODENOSCOPY (EGD) WITH PROPOFOL N/A 03/17/2015  ? Procedure: ESOPHAGOGASTRODUODENOSCOPY (EGD) WITH PROPOFOL;  Surgeon: 03/30/2015, MD;  Location: [facility] ENDOSCOPY;  Service: Gastroenterology;  Laterality: N/A;  ? ESOPHAGOGASTRODUODENOSCOPY (EGD) WITH PROPOFOL N/A 07/10/2021  ? Procedure: ESOPHAGOGASTRODUODENOSCOPY (EGD) WITH PROPOFOL;  Surgeon: 16/03/2022, MD;  Location[facility] ENDOSCOPY;  Service: Gastroenterology;  Laterality: N/A;  ? GIVENS CAPSULE STUDY N/A 07/11/2021  ? Procedure: GIVENS CAPSULE STUDY;  Surgeon: 07/24/2021, MD;  Location: [facility] ENDOSCOPY;  Service: Endoscopy;  Laterality: N/A;  ? NM MYOVIEW ([place] [facility])  07/2015  ? No evidence ischemia or infarction. EF 66%. Low risk  ? TONSILLECTOMY    ? TRANSTHORACIC ECHOCARDIOGRAM  10/2013  ? Normal LV size and function. EF 55-65%. GR 1 DD. Otherwise normal.  ? ?Family History  ?Problem Relation Age of Onset  ? Arthritis Mother   ? Stroke Mother   ? Hypertension Mother   ? Osteoporosis Mother   ? Heart disease Father   ?     MI  ? Heart attack Father   ? Stroke Brother   ? Heart attack Sister   ? Breast cancer Other   ?     first cousin x 2  ? Stroke Daughter   ? Heart attack Daughter   ? Colon cancer Neg Hx   ? ?Social History  ? ?Socioeconomic History  ? Marital status: Widowed  ?  Spouse name: Not on file  ? Number of children: 2  ? Years of education: Not on file  ? Highest education level: Not on file   ?Occupational History  ? Not on file  ?Tobacco Use  ? Smoking status: Never  ? Smokeless tobacco: Never  ?Vaping Use  ? Vaping Use: Never used  ?Substance and Sexual Activity  ? Alcohol use: No  ?  Alcohol/week: 0.0 standard drinks  ? Drug use: No  ? Sexual activity: Not Currently  ?  Birth control/protection: Post-menopausal  ?Other Topics Concern  ? Not on file  ?Social History Narrative  ? Not on file  ? ?Social Determinants of Health  ? ?Financial Resource Strain: Not on file  ?Food Insecurity: Not on file  ?Transportation Needs: Not on file  ?  Physical Activity: Not on file  ?Stress: Not on file  ?Social Connections: Not on file  ? ? ? ?Review of Systems  ?Constitutional:  Negative for appetite change.  ?     Weight gain as outlined.   ?HENT:  Negative for congestion and sinus pressure.   ?Respiratory:  Positive for shortness of breath. Negative for cough and chest tightness.   ?Cardiovascular:  Positive for leg swelling. Negative for chest pain and palpitations.  ?Gastrointestinal:  Negative for abdominal pain, diarrhea, nausea and vomiting.  ?Genitourinary:  Negative for difficulty urinating and dysuria.  ?Musculoskeletal:  Negative for joint swelling and myalgias.  ?Skin:   ?     Increased erythema - lower extremities.  Stasis changes.    ?Neurological:  Negative for dizziness, light-headedness and headaches.  ?Psychiatric/Behavioral:  Negative for agitation and dysphoric mood.   ? ?   ?Objective:  ?  ? ?BP 120/62   Pulse 64   Temp 98.3 ?F (36.8 ?C) (Oral)   Ht 5' (1.524 m)   Wt 205 lb (93 kg)   LMP 05/01/1972   SpO2 96%   BMI 40.04 kg/m?  ?Wt Readings from Last 3 Encounters:  ?07/26/21 205 lb (93 kg)  ?07/26/21 205 lb (93 kg)  ?07/21/21 203 lb (92.1 kg)  ? ? ?Physical Exam ?Vitals reviewed.  ?Constitutional:   ?   General: She is not in acute distress. ?   Appearance: Normal appearance.  ?HENT:  ?   Head: Normocephalic and atraumatic.  ?   Right Ear: External ear normal.  ?   Left Ear: External ear  normal.  ?Eyes:  ?   General: No scleral icterus.    ?   Right eye: No discharge.     ?   Left eye: No discharge.  ?   Conjunctiva/sclera: Conjunctivae normal.  ?Neck:  ?   Thyroid: No thyromegaly.  ?Cardiovascula

## 2021-07-26 NOTE — Telephone Encounter (Signed)
Pt c/o medication issue: ? ?1. Name of Medication: doryx ? ?2. How are you currently taking this medication (dosage and times per day)? new ? ?3. Are you having a reaction (difficulty breathing--STAT)? no ? ?4. What is your medication issue? Pharmacy calling .  They do not stock and PA is probable as this version is expensive . Please advise if ok to sub another rx.  ? ?

## 2021-07-27 NOTE — Telephone Encounter (Signed)
Per chart, referral has been placed. ?

## 2021-07-28 NOTE — Telephone Encounter (Signed)
Agree w/ continuing propranolol. ?

## 2021-07-29 MED ORDER — PROPRANOLOL HCL 20 MG PO TABS: 20.0000 mg | ORAL_TABLET | Freq: Three times a day (TID) | ORAL | 3 refills | Status: DC

## 2021-07-29 NOTE — Telephone Encounter (Signed)
Reviewed provider approval to send in refill for her propranolol. She was appreciative for the call back and confirmed appointment for next week. She had no further questions at this time.  ?

## 2021-07-30 DIAGNOSIS — I447 Left bundle-branch block, unspecified: Secondary | ICD-10-CM | POA: Diagnosis not present

## 2021-07-30 DIAGNOSIS — I5030 Unspecified diastolic (congestive) heart failure: Secondary | ICD-10-CM | POA: Diagnosis not present

## 2021-07-30 DIAGNOSIS — R195 Other fecal abnormalities: Secondary | ICD-10-CM | POA: Diagnosis not present

## 2021-07-30 DIAGNOSIS — Z7982 Long term (current) use of aspirin: Secondary | ICD-10-CM | POA: Diagnosis not present

## 2021-07-30 DIAGNOSIS — K2960 Other gastritis without bleeding: Secondary | ICD-10-CM | POA: Diagnosis not present

## 2021-07-30 DIAGNOSIS — G4733 Obstructive sleep apnea (adult) (pediatric): Secondary | ICD-10-CM | POA: Diagnosis present

## 2021-07-30 DIAGNOSIS — K449 Diaphragmatic hernia without obstruction or gangrene: Secondary | ICD-10-CM | POA: Diagnosis not present

## 2021-07-30 DIAGNOSIS — N179 Acute kidney failure, unspecified: Secondary | ICD-10-CM | POA: Diagnosis not present

## 2021-07-30 DIAGNOSIS — I440 Atrioventricular block, first degree: Secondary | ICD-10-CM | POA: Diagnosis not present

## 2021-07-30 DIAGNOSIS — Z9989 Dependence on other enabling machines and devices: Secondary | ICD-10-CM | POA: Diagnosis not present

## 2021-07-30 DIAGNOSIS — K2901 Acute gastritis with bleeding: Secondary | ICD-10-CM | POA: Diagnosis present

## 2021-07-30 DIAGNOSIS — Z79899 Other long term (current) drug therapy: Secondary | ICD-10-CM | POA: Diagnosis not present

## 2021-07-30 DIAGNOSIS — I4891 Unspecified atrial fibrillation: Secondary | ICD-10-CM | POA: Diagnosis not present

## 2021-07-30 DIAGNOSIS — K922 Gastrointestinal hemorrhage, unspecified: Secondary | ICD-10-CM | POA: Diagnosis not present

## 2021-07-30 DIAGNOSIS — I130 Hypertensive heart and chronic kidney disease with heart failure and stage 1 through stage 4 chronic kidney disease, or unspecified chronic kidney disease: Secondary | ICD-10-CM | POA: Diagnosis present

## 2021-07-30 DIAGNOSIS — D62 Acute posthemorrhagic anemia: Secondary | ICD-10-CM | POA: Diagnosis present

## 2021-07-30 DIAGNOSIS — Z9861 Coronary angioplasty status: Secondary | ICD-10-CM | POA: Diagnosis not present

## 2021-07-30 DIAGNOSIS — I491 Atrial premature depolarization: Secondary | ICD-10-CM | POA: Diagnosis not present

## 2021-07-30 DIAGNOSIS — T45515A Adverse effect of anticoagulants, initial encounter: Secondary | ICD-10-CM | POA: Diagnosis present

## 2021-07-30 DIAGNOSIS — E1122 Type 2 diabetes mellitus with diabetic chronic kidney disease: Secondary | ICD-10-CM | POA: Diagnosis present

## 2021-07-30 DIAGNOSIS — R0609 Other forms of dyspnea: Secondary | ICD-10-CM | POA: Diagnosis not present

## 2021-07-30 DIAGNOSIS — N1832 Chronic kidney disease, stage 3b: Secondary | ICD-10-CM | POA: Diagnosis not present

## 2021-07-30 DIAGNOSIS — N183 Chronic kidney disease, stage 3 unspecified: Secondary | ICD-10-CM | POA: Diagnosis not present

## 2021-07-30 DIAGNOSIS — Z7901 Long term (current) use of anticoagulants: Secondary | ICD-10-CM | POA: Diagnosis not present

## 2021-07-30 DIAGNOSIS — K921 Melena: Secondary | ICD-10-CM | POA: Diagnosis not present

## 2021-07-30 DIAGNOSIS — I2510 Atherosclerotic heart disease of native coronary artery without angina pectoris: Secondary | ICD-10-CM | POA: Diagnosis not present

## 2021-07-30 DIAGNOSIS — R710 Precipitous drop in hematocrit: Secondary | ICD-10-CM | POA: Diagnosis not present

## 2021-07-30 DIAGNOSIS — Z8679 Personal history of other diseases of the circulatory system: Secondary | ICD-10-CM | POA: Diagnosis not present

## 2021-07-30 DIAGNOSIS — E785 Hyperlipidemia, unspecified: Secondary | ICD-10-CM | POA: Diagnosis present

## 2021-07-30 DIAGNOSIS — D6832 Hemorrhagic disorder due to extrinsic circulating anticoagulants: Secondary | ICD-10-CM | POA: Diagnosis present

## 2021-07-30 DIAGNOSIS — R5383 Other fatigue: Secondary | ICD-10-CM | POA: Diagnosis not present

## 2021-07-30 DIAGNOSIS — I5032 Chronic diastolic (congestive) heart failure: Secondary | ICD-10-CM | POA: Diagnosis present

## 2021-07-30 DIAGNOSIS — K219 Gastro-esophageal reflux disease without esophagitis: Secondary | ICD-10-CM | POA: Diagnosis present

## 2021-07-30 DIAGNOSIS — I480 Paroxysmal atrial fibrillation: Secondary | ICD-10-CM | POA: Diagnosis present

## 2021-07-31 DIAGNOSIS — N183 Chronic kidney disease, stage 3 unspecified: Secondary | ICD-10-CM | POA: Diagnosis not present

## 2021-07-31 DIAGNOSIS — I491 Atrial premature depolarization: Secondary | ICD-10-CM | POA: Diagnosis not present

## 2021-07-31 DIAGNOSIS — K2901 Acute gastritis with bleeding: Secondary | ICD-10-CM | POA: Diagnosis present

## 2021-07-31 DIAGNOSIS — I440 Atrioventricular block, first degree: Secondary | ICD-10-CM | POA: Diagnosis not present

## 2021-07-31 DIAGNOSIS — Z8679 Personal history of other diseases of the circulatory system: Secondary | ICD-10-CM | POA: Diagnosis not present

## 2021-07-31 DIAGNOSIS — K2960 Other gastritis without bleeding: Secondary | ICD-10-CM | POA: Diagnosis not present

## 2021-07-31 DIAGNOSIS — N179 Acute kidney failure, unspecified: Secondary | ICD-10-CM | POA: Diagnosis not present

## 2021-07-31 DIAGNOSIS — Z9989 Dependence on other enabling machines and devices: Secondary | ICD-10-CM | POA: Diagnosis not present

## 2021-07-31 DIAGNOSIS — Z9861 Coronary angioplasty status: Secondary | ICD-10-CM | POA: Diagnosis not present

## 2021-07-31 DIAGNOSIS — R195 Other fecal abnormalities: Secondary | ICD-10-CM | POA: Diagnosis not present

## 2021-07-31 DIAGNOSIS — I5030 Unspecified diastolic (congestive) heart failure: Secondary | ICD-10-CM | POA: Diagnosis not present

## 2021-07-31 DIAGNOSIS — D6832 Hemorrhagic disorder due to extrinsic circulating anticoagulants: Secondary | ICD-10-CM | POA: Diagnosis present

## 2021-07-31 DIAGNOSIS — I5032 Chronic diastolic (congestive) heart failure: Secondary | ICD-10-CM | POA: Diagnosis present

## 2021-07-31 DIAGNOSIS — E785 Hyperlipidemia, unspecified: Secondary | ICD-10-CM | POA: Diagnosis present

## 2021-07-31 DIAGNOSIS — K921 Melena: Secondary | ICD-10-CM | POA: Diagnosis not present

## 2021-07-31 DIAGNOSIS — R0609 Other forms of dyspnea: Secondary | ICD-10-CM | POA: Diagnosis not present

## 2021-07-31 DIAGNOSIS — K449 Diaphragmatic hernia without obstruction or gangrene: Secondary | ICD-10-CM | POA: Diagnosis present

## 2021-07-31 DIAGNOSIS — K922 Gastrointestinal hemorrhage, unspecified: Secondary | ICD-10-CM | POA: Diagnosis not present

## 2021-07-31 DIAGNOSIS — I4891 Unspecified atrial fibrillation: Secondary | ICD-10-CM | POA: Diagnosis not present

## 2021-07-31 DIAGNOSIS — Z79899 Other long term (current) drug therapy: Secondary | ICD-10-CM | POA: Diagnosis not present

## 2021-07-31 DIAGNOSIS — R5383 Other fatigue: Secondary | ICD-10-CM | POA: Diagnosis not present

## 2021-07-31 DIAGNOSIS — I130 Hypertensive heart and chronic kidney disease with heart failure and stage 1 through stage 4 chronic kidney disease, or unspecified chronic kidney disease: Secondary | ICD-10-CM | POA: Diagnosis present

## 2021-07-31 DIAGNOSIS — I2510 Atherosclerotic heart disease of native coronary artery without angina pectoris: Secondary | ICD-10-CM | POA: Diagnosis present

## 2021-07-31 DIAGNOSIS — I480 Paroxysmal atrial fibrillation: Secondary | ICD-10-CM | POA: Diagnosis present

## 2021-07-31 DIAGNOSIS — I447 Left bundle-branch block, unspecified: Secondary | ICD-10-CM | POA: Diagnosis not present

## 2021-07-31 DIAGNOSIS — Z7901 Long term (current) use of anticoagulants: Secondary | ICD-10-CM | POA: Diagnosis not present

## 2021-07-31 DIAGNOSIS — Z7982 Long term (current) use of aspirin: Secondary | ICD-10-CM | POA: Diagnosis not present

## 2021-07-31 DIAGNOSIS — K219 Gastro-esophageal reflux disease without esophagitis: Secondary | ICD-10-CM | POA: Diagnosis present

## 2021-07-31 DIAGNOSIS — R710 Precipitous drop in hematocrit: Secondary | ICD-10-CM | POA: Diagnosis not present

## 2021-07-31 DIAGNOSIS — T45515A Adverse effect of anticoagulants, initial encounter: Secondary | ICD-10-CM | POA: Diagnosis present

## 2021-07-31 DIAGNOSIS — N1832 Chronic kidney disease, stage 3b: Secondary | ICD-10-CM | POA: Diagnosis present

## 2021-07-31 DIAGNOSIS — D62 Acute posthemorrhagic anemia: Secondary | ICD-10-CM | POA: Diagnosis present

## 2021-07-31 DIAGNOSIS — E1122 Type 2 diabetes mellitus with diabetic chronic kidney disease: Secondary | ICD-10-CM | POA: Diagnosis present

## 2021-07-31 DIAGNOSIS — G4733 Obstructive sleep apnea (adult) (pediatric): Secondary | ICD-10-CM | POA: Diagnosis present

## 2021-07-31 NOTE — Progress Notes (Deleted)
Cardiology Office Note ? ?Date:  07/31/2021  ? ?ID:  April [name], DOB 1941/03/13, MRN [id number] ? ?PCP:  April Pheasant, [name]  ? ?No chief complaint on file. ? ? ?HPI:  ?April [name] is a pleasant 81 year-old woman with history of  ?coronary artery disease,  ?chronic shortness of breath  ?STEMI November 2017 stent x2 to her RCA ?atrial fibrillation while in the hospital November 2017 ?elevated CHADS VASC (at least 4) ?who presents for follow-up of her coronary artery disease , atrial fib ? ?Last office visit November 2022 ? ? ? ?Has noticed some increasing shortness of breath ?Worsening lymphedema ?Was in wraps, now off, swelling getting worse ?Uses lymph pumps at home ? ?On lasix 40 in AM, missing afternoon doses here and there ?Was taking half dose every other day, increased up to a whole dose in the p.m. ? ?Mild tremor, etiology unclear, muscle fatigue ? ?Weight stable 195, up slightly ? ?Denies chest pain concerning for angina ?No tachypalpitations concerning for arrhythmia ? ?Recent [name] work reviewed ?A1C 6.1 ?CR 1.03 ? ?EKG personally reviewed by myself on todays visit ?Shows sinus bradycardia rate 55 bpm left bundle branch block ? ?Other past medical history reviewed ?Prior stenting details from 2017 ?CAD S/P PCI pRCA Promus DES 3.5 x 16 (4.1 mm), ostRPDA Promus DES 2.5 x 12 (2.7 mm) ? done at [name] by report ? ?Chronic weakness with speech ?Previously completed speech therapy ? ? chronic back pain ?Previously seen by Dr. Arnoldo [name] in [name] ? ?chest pain [place] the [place] ?Took nitroglycerin x2, did not seem to help ?Went to urgent care initially ?Was sent to hospital 01/2018 ?Was seen at [name]  ? ?Echo  1. Normal biventricular systolic function and size. LVEF 55-60%. ? 2. No significant valvular dysfunction. ? 3. Grade II diastolic dysfunction with elevated left-sided filling ?  pressures. ? 4. RVSP 41 mmHg consistent with mild pulmonary hypertension ? ?Full cardiac catheterization report is not available but discharge summary reports stents were patent to the RCA ? ? ? ?PMH:   has a past medical history of Anemia, CAD S/P PCI pRCA Promus DES 3.5 x 16 (4.1 mm), ostRPDA Promus DES 2.5 x 12 (2.7 mm) (03/25/2016), CAD S/P percutaneous coronary angioplasty, CKD (chronic kidney disease), stage III ([place] [name]), Diastolic dysfunction, Diverticulitis, Dyspnea, Endometriosis, Family history of adverse reaction to anesthesia, GERD (gastroesophageal reflux disease), GI bleed, Hiatal hernia, History of colon polyps (08/1994), History of Migraines, Hypercholesterolemia, Hypertension, LBBB (left bundle branch block), Osteoporosis, PAF (paroxysmal atrial fibrillation) ([name]), Rheumatic fever, Sleep apnea, Spasmodic dysphonia, April elevation myocardial infarction (STEMI) of inferolateral wall, initial episode of care ([name]) (03/25/2016), April elevation myocardial infarction (STEMI) of inferolateral wall, initial episode of care (April [name]) (03/25/2016), and Urine incontinence. ? ?PSH:    ?Past Surgical History:  ?Procedure Laterality Date  ? ABDOMINAL HYSTERECTOMY    ? ovaries not removed  ? APPENDECTOMY    ? was removed during hysterectomy  ? Breast biopsies    ? x2  ? BREAST EXCISIONAL BIOPSY Bilateral "years ago"  ? neg  ? BREAST SURGERY Bilateral   ? BROW LIFT Bilateral 08/15/2019  ? Procedure: BLEPHAROPLASTY UPPER EYELID; W/EXCESS SKIN BLEPHAROPTOSIS REPAIR; RESECT EX;  Surgeon: April [name];  Location: [name];  Service: Ophthalmology;  Laterality: Bilateral;  sleep apnea  ? CARDIAC CATHETERIZATION  2011  ? moderate 40% RCA disease  ?  CARDIAC CATHETERIZATION  01/2010  ? Dr. Bobie [name]'@[facility]'$ : Only noted 40% RCA  ? CARDIAC CATHETERIZATION N/A 03/25/2016  ? Procedure: Left Heart Cath and Coronary Angiography;  Surgeon: April [name];  Location: [name];  Service: Cardiovascular;  Laterality:  N/A;  ? CARDIAC CATHETERIZATION N/A 03/25/2016  ? Procedure: Coronary Stent Intervention;  Surgeon: April [name];  Location: [name];  Service: Cardiovascular;  Laterality: N/A;  ? COLONOSCOPY  2013  ? COLONOSCOPY WITH PROPOFOL N/A 07/11/2021  ? Procedure: COLONOSCOPY WITH PROPOFOL;  Surgeon: April [name];  Location: [name];  Service: [name];  Laterality: N/A;  ? ESOPHAGOGASTRODUODENOSCOPY (EGD) WITH PROPOFOL N/A 03/17/2015  ? Procedure: ESOPHAGOGASTRODUODENOSCOPY (EGD) WITH PROPOFOL;  Surgeon: 03/30/2015, [name];  Location: [name];  Laterality: N/A;  ? ESOPHAGOGASTRODUODENOSCOPY (EGD) WITH PROPOFOL N/A 07/10/2021  ? Procedure: ESOPHAGOGASTRODUODENOSCOPY (EGD) WITH PROPOFOL;  Surgeon: April [name];  Location: [name];  Service: [name];  Laterality: N/A;  ? GIVENS CAPSULE STUDY N/A 07/11/2021  ? Procedure: GIVENS CAPSULE STUDY;  Surgeon: April [name];  Location: [name];  Service: [name];  Laterality: N/A;  ? NM MYOVIEW ([place] [facility])  07/2015  ? No evidence ischemia or infarction. EF 66%. Low risk  ? TONSILLECTOMY    ? TRANSTHORACIC ECHOCARDIOGRAM  10/2013  ? Normal LV size and function. EF 55-65%. GR 1 DD. Otherwise normal.  ? ? ?Current Outpatient Medications  ?Medication Sig Dispense Refill  ? acetaminophen (TYLENOL) 500 MG tablet Take 500 mg by mouth daily as needed for headache (pain).    ? albuterol (PROVENTIL HFA;VENTOLIN HFA) 108 (90 Base) MCG/ACT inhaler Inhale 2 puffs into the lungs every 6 (six) hours as needed for wheezing or shortness of breath. 1 Inhaler 6  ? amiodarone (PACERONE) 200 MG tablet TAKE 1 TABLET BY MOUTH DAILY 90 tablet 0  ? amoxicillin (AMOXIL) 875 MG tablet Take 1 tablet (875 mg total) by mouth 2 (two) times daily for 7 days. 14 tablet 0  ? apixaban (ELIQUIS) 5 MG TABS tablet Take 1 tablet (5 mg total) by mouth 2 (two) times daily. 180 tablet 1  ? aspirin EC 81 MG tablet Take 1  tablet (81 mg total) by mouth daily. 90 tablet 3  ? Azelastine HCl 137 MCG/SPRAY SOLN USE 1 TO 2 SPRAYS IN EACH NOSTRIL TWICE A DAY AS NEEDED FOR DRAINAGE 90 mL 0  ? doxycycline (DORYX) 100 MG EC tablet Take 1 tablet (100 mg total) by mouth 2 (two) times daily for 7 days. 14 tablet 0  ? fluticasone-salmeterol (ADVAIR HFA) 230-21 MCG/ACT inhaler Inhale 2 puffs into the lungs 2 (two) times daily. (Patient not taking: Reported on 07/26/2021) 1 each 12  ? furosemide (LASIX) 40 MG tablet Take 1 tablet (40 mg total) by mouth 2 (two) times daily. Dr. 08/08/2021 advised alternate 40 mg twice a day with 40 mg daily every other day 180 tablet 3  ? hydrALAZINE (APRESOLINE) 10 MG tablet TAKE ONE TABLET BY MOUTH THREE TIMES A DAY 270 tablet 1  ? losartan (COZAAR) 100 MG tablet Take 1 tablet (100 mg total) by mouth daily. Please hold until you are PCP visit (Patient not taking: Reported on 07/26/2021) 90 tablet 1  ? nitroGLYCERIN (NITROSTAT) 0.4 MG SL tablet Place 1 tablet (0.4 mg total) under the tongue every 5 (five) minutes as needed. 25 tablet 0  ? pantoprazole (PROTONIX) 40 MG tablet Take 1 tablet (40 mg total) by mouth daily. [place]  tablet 5  ? Polyethyl Glycol-Propyl Glycol (SYSTANE OP) Place 1 drop into both eyes daily as needed (dry eyes).    ? potassium chloride (KLOR-CON) 10 MEQ tablet TAKE 1 TABLET BY MOUTH TWICE A DAY 180 tablet 0  ? propranolol (INDERAL) 20 MG tablet Take 1 tablet (20 mg total) by mouth 3 (three) times daily. 270 tablet 3  ? rosuvastatin (CRESTOR) 40 MG tablet TAKE 1 TABLET BY MOUTH DAILY 90 tablet 0  ? ?No current facility-administered medications for this visit.  ? ?Facility-Administered Medications Ordered in Other Visits  ?Medication Dose Route Frequency Provider Last Rate Last Admin  ? rosuvastatin (CRESTOR) tablet     Provider, Generic External Data      ? ? ? ?Allergies:   Penicillins and Sulfa antibiotics  ? ?Social History:  The patient  reports that she has never smoked. She has never used smokeless  tobacco. She reports that she does not drink alcohol and does not use drugs.  ? ?Family History:   family history includes Arthritis in her mother; Breast cancer in an other family member; Heart attack in her da

## 2021-08-01 ENCOUNTER — Encounter: Payer: Self-pay | Admitting: Internal Medicine

## 2021-08-01 ENCOUNTER — Telehealth: Payer: Self-pay | Admitting: Internal Medicine

## 2021-08-01 ENCOUNTER — Ambulatory Visit: Payer: Medicare Other | Admitting: Cardiovascular Disease

## 2021-08-01 DIAGNOSIS — L03119 Cellulitis of unspecified part of limb: Secondary | ICD-10-CM

## 2021-08-01 DIAGNOSIS — I480 Paroxysmal atrial fibrillation: Secondary | ICD-10-CM

## 2021-08-01 DIAGNOSIS — I5033 Acute on chronic diastolic (congestive) heart failure: Secondary | ICD-10-CM

## 2021-08-01 DIAGNOSIS — D649 Anemia, unspecified: Secondary | ICD-10-CM

## 2021-08-01 DIAGNOSIS — K922 Gastrointestinal hemorrhage, unspecified: Secondary | ICD-10-CM

## 2021-08-01 DIAGNOSIS — N183 Chronic kidney disease, stage 3 unspecified: Secondary | ICD-10-CM

## 2021-08-01 DIAGNOSIS — J449 Chronic obstructive pulmonary disease, unspecified: Secondary | ICD-10-CM | POA: Insufficient documentation

## 2021-08-01 NOTE — Telephone Encounter (Signed)
Pt returning call. Pt requesting callback.  °

## 2021-08-01 NOTE — Assessment & Plan Note (Signed)
Blood pressure on recheck improved 120/62.  Lasix dose being adjusted as outlined.  Follow pressurs.  Follow metabolic panel.  ?

## 2021-08-01 NOTE — Assessment & Plan Note (Signed)
Recent admission for GI bleed - anemia.  Transfused.  Follow cbc. No further bleeding.  No obvious source identified.   

## 2021-08-01 NOTE — Assessment & Plan Note (Signed)
On crestor.  Low cholesterol diet and exercise.  Follow lipid panel and liver function tests.   

## 2021-08-01 NOTE — Assessment & Plan Note (Signed)
Has seen vascular.  Previous unna boots.  Felt aggravated - burning, etc.  Discussed need for f/u with vascular.  Request referral to [facility].   ?

## 2021-08-01 NOTE — Assessment & Plan Note (Signed)
Had colonoscopy during recent admission as outlined.  No obvious source of bleeding.  Follow.  ?

## 2021-08-01 NOTE — Assessment & Plan Note (Signed)
Chronic lower extremity swelling.  Unable to wear compression hose.  Wanted a break from [place] boots.  Ace wraps.  Weight as outlined.  Continues on losartan.  Taking lasix as directed.  Dose being adjusted.  Keep f/u next week.    ?

## 2021-08-01 NOTE — Assessment & Plan Note (Signed)
Intolerance to previous steroid inhaler.  SOB as outlined.  Lasix dose being adjusted.  Request referral to Dr Laural Roes.   ?

## 2021-08-01 NOTE — Telephone Encounter (Signed)
Lft pt vm to call ofc . thanks 

## 2021-08-01 NOTE — Assessment & Plan Note (Signed)
Increased sob and weight gain as outlined.  Just saw cardiology today.  Lasix dose being adjusted.  Plan for f/u next week with f/u labs.  Discussed pulmonary referral.  Pt request appt with Dr Laural Roes.   ?

## 2021-08-01 NOTE — Assessment & Plan Note (Signed)
Continue cpap.  

## 2021-08-01 NOTE — Assessment & Plan Note (Signed)
Continue crestor 

## 2021-08-01 NOTE — Assessment & Plan Note (Signed)
Continue protonix  

## 2021-08-01 NOTE — Assessment & Plan Note (Signed)
Low carb diet and exercise.  Follow met b and a1c.  ?

## 2021-08-01 NOTE — Assessment & Plan Note (Signed)
No chest pain - continues hydralazine, losartan and plans to restart eliquis.  Does report worsening sob with exertion.  (has chronic sob, but describes noticing some worsening).  Saw cardiology today.  Lasix dose adjusted as outlined.  Keep f/u next week.  ?

## 2021-08-01 NOTE — Assessment & Plan Note (Signed)
Afib.  Plans to resume eliquis today.  ?

## 2021-08-01 NOTE — Assessment & Plan Note (Signed)
Has been on eliquis and amiodarone.  Off eliquis recently secondary to GI bleed.  Plans to restart today.   Followed by cardiology.  ?

## 2021-08-01 NOTE — Assessment & Plan Note (Signed)
Bilateral lower extremity swelling as outlined.  Saw cardiology today. Note reviewed.  Lasix being adjusted as outlined.  Will need close monitor of kidney function.  Recommended f/u next week.  ?

## 2021-08-02 ENCOUNTER — Ambulatory Visit (INDEPENDENT_AMBULATORY_CARE_PROVIDER_SITE_OTHER): Payer: Medicare Other | Admitting: Vascular Surgery

## 2021-08-04 ENCOUNTER — Telehealth: Payer: Self-pay | Admitting: Cardiovascular Disease

## 2021-08-04 DIAGNOSIS — Z20822 Contact with and (suspected) exposure to covid-19: Secondary | ICD-10-CM | POA: Diagnosis not present

## 2021-08-04 NOTE — Telephone Encounter (Signed)
Patient states she was taken off her medications and would like to know what she is to do until her appointment in May.  ?

## 2021-08-04 NOTE — Telephone Encounter (Signed)
Left voicemail message to call back for review of results and recommendations.  

## 2021-08-05 MED ORDER — APIXABAN 2.5 MG PO TABS: 2.5000 mg | ORAL_TABLET | Freq: Two times a day (BID) | ORAL | 11 refills | Status: DC

## 2021-08-05 NOTE — Telephone Encounter (Signed)
Reviewed results and recommendations with patient. She also reports increased swelling to her feet and was audibly short of breath. We reviewed instructions and rescheduled for her to be seen sooner. Advised we can draw labs at that visit. Instructed her to monitor blood pressures and to call us if they consisently trend greater than 140/90. She verbalized understanding of our conversation, agreement with plan, and has no further questions at this time.  ?

## 2021-08-05 NOTE — Telephone Encounter (Signed)
Patient states she was instructed to stop her Losartan and her Eliquis. She is concerned about stopping these and wanted to give [place] a call. She reports that she had a GI bleed and that was the reason it was stopped. She reports they told her to check with [place] before restarting the losartan. She is more concerned with holding the blood thinner. Inquired if she has checked her blood pressures at home and she has not. Had her check while I had her on the phone. It was BP 126/59 HR 58.  ? ?She was at [facility] from last Saturday until this Tuesday. Prior to that she was at [facility] last March.  ? ?Advised I would review with provider and give her a call back with their recommendations. She was appreciative for the call with no further questions at this time.  ?

## 2021-08-05 NOTE — Telephone Encounter (Signed)
She was admitted to the hospital in 06/2021 for GI bleed requiring 2 units PRBC with work-up including EGD, colonoscopy, and capsule endoscopy demonstrating mild gastritis.  She was subsequently cleared by GI to resume anticoagulation.  This, along with aspirin were restarted after her visit with our office on 07/26/2021.  She was subsequently admitted to [facility] with melena.  Repeat endoscopic demonstrated a large hiatal hernia and antral erosive gastritis with GI feeling the hiatal hernia may be the source of her bleeding.  Note indicates "okay from GI perspective to resume Eliquis."  Patient was advised to hold Eliquis until she follows up as an outpatient with cardiology to discuss resumption.  At time of discharge, hemoglobin remained low at 8.8, though was stable.  She was also advised to hold losartan secondary to acute on CKD.  Discharge cardiac medications included amiodarone 200 mg daily, aspirin 81 mg daily, furosemide 40 mg daily with an additional tablet as needed every other day, hydralazine 10 mg 3 times daily, KCl 10 mEq twice daily, rosuvastatin 40 mg daily, propanolol 20 mg 3 times daily, and as needed SL NTG. ? ?CHA2DS2-VASc 6 (CHF, HTN, age x2, vascular disease, sex category) ? ?Patient contacted our office today expressing concern over holding her Eliquis and losartan. ? ?Recommendations: ?-Discussed with primary cardiologist ?-Resume apixaban 2.5 mg twice daily (currently meets reduced dosing criteria given age and serum creatinine) ?-Hold aspirin given no recent PCI ?-Follow-up CBC and BMP in 1 week ?-PPI should be continued ?-Follow-up with GI ?-In the long-term, may need to consider left atrial appendage occluder device ?-With regards to her holding losartan, she should monitor her blood pressure and notify us if her BP consistently trends greater than 140/90 ?

## 2021-08-08 ENCOUNTER — Emergency Department: Payer: Medicare Other

## 2021-08-08 ENCOUNTER — Telehealth: Payer: Self-pay | Admitting: Internal Medicine

## 2021-08-08 ENCOUNTER — Other Ambulatory Visit: Payer: Self-pay

## 2021-08-08 ENCOUNTER — Inpatient Hospital Stay
Admission: EM | Admit: 2021-08-08 | Discharge: 2021-08-16 | DRG: 291 | Disposition: A | Discharge status: Home Health | Payer: Medicare Other | Attending: Internal Medicine | Admitting: Internal Medicine

## 2021-08-08 ENCOUNTER — Telehealth (INDEPENDENT_AMBULATORY_CARE_PROVIDER_SITE_OTHER): Payer: Self-pay | Admitting: Vascular Surgery

## 2021-08-08 DIAGNOSIS — M810 Age-related osteoporosis without current pathological fracture: Secondary | ICD-10-CM | POA: Diagnosis present

## 2021-08-08 DIAGNOSIS — I10 Essential (primary) hypertension: Secondary | ICD-10-CM | POA: Diagnosis present

## 2021-08-08 DIAGNOSIS — I890 Lymphedema, not elsewhere classified: Secondary | ICD-10-CM | POA: Diagnosis present

## 2021-08-08 DIAGNOSIS — R5381 Other malaise: Secondary | ICD-10-CM | POA: Diagnosis present

## 2021-08-08 DIAGNOSIS — J90 Pleural effusion, not elsewhere classified: Secondary | ICD-10-CM | POA: Diagnosis not present

## 2021-08-08 DIAGNOSIS — Z79891 Long term (current) use of opiate analgesic: Secondary | ICD-10-CM

## 2021-08-08 DIAGNOSIS — Z6838 Body mass index (BMI) 38.0-38.9, adult: Secondary | ICD-10-CM

## 2021-08-08 DIAGNOSIS — R001 Bradycardia, unspecified: Secondary | ICD-10-CM | POA: Diagnosis present

## 2021-08-08 DIAGNOSIS — I447 Left bundle-branch block, unspecified: Secondary | ICD-10-CM | POA: Diagnosis present

## 2021-08-08 DIAGNOSIS — K2960 Other gastritis without bleeding: Secondary | ICD-10-CM | POA: Diagnosis present

## 2021-08-08 DIAGNOSIS — Z882 Allergy status to sulfonamides status: Secondary | ICD-10-CM

## 2021-08-08 DIAGNOSIS — E1122 Type 2 diabetes mellitus with diabetic chronic kidney disease: Secondary | ICD-10-CM | POA: Diagnosis present

## 2021-08-08 DIAGNOSIS — D649 Anemia, unspecified: Secondary | ICD-10-CM | POA: Diagnosis present

## 2021-08-08 DIAGNOSIS — N184 Chronic kidney disease, stage 4 (severe): Secondary | ICD-10-CM | POA: Diagnosis present

## 2021-08-08 DIAGNOSIS — D631 Anemia in chronic kidney disease: Secondary | ICD-10-CM | POA: Diagnosis present

## 2021-08-08 DIAGNOSIS — Z6841 Body Mass Index (BMI) 40.0 and over, adult: Secondary | ICD-10-CM

## 2021-08-08 DIAGNOSIS — E7800 Pure hypercholesterolemia, unspecified: Secondary | ICD-10-CM | POA: Diagnosis present

## 2021-08-08 DIAGNOSIS — D6489 Other specified anemias: Secondary | ICD-10-CM | POA: Diagnosis not present

## 2021-08-08 DIAGNOSIS — Z880 Allergy status to penicillin: Secondary | ICD-10-CM

## 2021-08-08 DIAGNOSIS — Z79899 Other long term (current) drug therapy: Secondary | ICD-10-CM

## 2021-08-08 DIAGNOSIS — G4730 Sleep apnea, unspecified: Secondary | ICD-10-CM | POA: Diagnosis present

## 2021-08-08 DIAGNOSIS — I5031 Acute diastolic (congestive) heart failure: Secondary | ICD-10-CM | POA: Diagnosis not present

## 2021-08-08 DIAGNOSIS — Z20822 Contact with and (suspected) exposure to covid-19: Secondary | ICD-10-CM | POA: Diagnosis not present

## 2021-08-08 DIAGNOSIS — Z7189 Other specified counseling: Secondary | ICD-10-CM | POA: Diagnosis not present

## 2021-08-08 DIAGNOSIS — Z955 Presence of coronary angioplasty implant and graft: Secondary | ICD-10-CM

## 2021-08-08 DIAGNOSIS — J449 Chronic obstructive pulmonary disease, unspecified: Secondary | ICD-10-CM | POA: Diagnosis not present

## 2021-08-08 DIAGNOSIS — I509 Heart failure, unspecified: Secondary | ICD-10-CM | POA: Diagnosis not present

## 2021-08-08 DIAGNOSIS — I2510 Atherosclerotic heart disease of native coronary artery without angina pectoris: Secondary | ICD-10-CM | POA: Diagnosis not present

## 2021-08-08 DIAGNOSIS — G4733 Obstructive sleep apnea (adult) (pediatric): Secondary | ICD-10-CM | POA: Diagnosis not present

## 2021-08-08 DIAGNOSIS — I5032 Chronic diastolic (congestive) heart failure: Secondary | ICD-10-CM | POA: Diagnosis present

## 2021-08-08 DIAGNOSIS — K922 Gastrointestinal hemorrhage, unspecified: Secondary | ICD-10-CM | POA: Diagnosis not present

## 2021-08-08 DIAGNOSIS — I110 Hypertensive heart disease with heart failure: Secondary | ICD-10-CM | POA: Diagnosis not present

## 2021-08-08 DIAGNOSIS — N179 Acute kidney failure, unspecified: Secondary | ICD-10-CM | POA: Diagnosis present

## 2021-08-08 DIAGNOSIS — I5033 Acute on chronic diastolic (congestive) heart failure: Secondary | ICD-10-CM | POA: Diagnosis present

## 2021-08-08 DIAGNOSIS — K219 Gastro-esophageal reflux disease without esophagitis: Secondary | ICD-10-CM | POA: Diagnosis present

## 2021-08-08 DIAGNOSIS — R0602 Shortness of breath: Secondary | ICD-10-CM | POA: Diagnosis not present

## 2021-08-08 DIAGNOSIS — E876 Hypokalemia: Secondary | ICD-10-CM | POA: Diagnosis not present

## 2021-08-08 DIAGNOSIS — I130 Hypertensive heart and chronic kidney disease with heart failure and stage 1 through stage 4 chronic kidney disease, or unspecified chronic kidney disease: Secondary | ICD-10-CM | POA: Diagnosis present

## 2021-08-08 DIAGNOSIS — Z7951 Long term (current) use of inhaled steroids: Secondary | ICD-10-CM

## 2021-08-08 DIAGNOSIS — Z90710 Acquired absence of both cervix and uterus: Secondary | ICD-10-CM

## 2021-08-08 DIAGNOSIS — I252 Old myocardial infarction: Secondary | ICD-10-CM | POA: Diagnosis not present

## 2021-08-08 DIAGNOSIS — Z7901 Long term (current) use of anticoagulants: Secondary | ICD-10-CM

## 2021-08-08 DIAGNOSIS — I480 Paroxysmal atrial fibrillation: Secondary | ICD-10-CM | POA: Diagnosis present

## 2021-08-08 DIAGNOSIS — N1832 Chronic kidney disease, stage 3b: Secondary | ICD-10-CM | POA: Diagnosis not present

## 2021-08-08 DIAGNOSIS — Z713 Dietary counseling and surveillance: Secondary | ICD-10-CM

## 2021-08-08 DIAGNOSIS — N170 Acute kidney failure with tubular necrosis: Secondary | ICD-10-CM | POA: Diagnosis not present

## 2021-08-08 DIAGNOSIS — I5023 Acute on chronic systolic (congestive) heart failure: Secondary | ICD-10-CM | POA: Diagnosis not present

## 2021-08-08 DIAGNOSIS — K449 Diaphragmatic hernia without obstruction or gangrene: Secondary | ICD-10-CM | POA: Diagnosis not present

## 2021-08-08 DIAGNOSIS — Z8249 Family history of ischemic heart disease and other diseases of the circulatory system: Secondary | ICD-10-CM

## 2021-08-08 LAB — BASIC METABOLIC PANEL
Anion gap: 7 (ref 5–15)
BUN: 29 mg/dL — ABNORMAL HIGH (ref 8–23)
CO2: 25 mmol/L (ref 22–32)
Calcium: 8.2 mg/dL — ABNORMAL LOW (ref 8.9–10.3)
Chloride: 104 mmol/L (ref 98–111)
Creatinine, Ser: 1.87 mg/dL — ABNORMAL HIGH (ref 0.44–1.00)
GFR, Estimated: 27 mL/min — ABNORMAL LOW (ref >60)
Glucose, Bld: 124 mg/dL — ABNORMAL HIGH (ref 70–99)
Potassium: 3.9 mmol/L (ref 3.5–5.1)
Sodium: 136 mmol/L (ref 135–145)

## 2021-08-08 LAB — CBC
HCT: 29.1 % — ABNORMAL LOW (ref 36.0–46.0)
Hemoglobin: 9.1 g/dL — ABNORMAL LOW (ref 12.0–15.0)
MCH: 29.4 pg (ref 26.0–34.0)
MCHC: 31.3 g/dL (ref 30.0–36.0)
MCV: 94.2 fL (ref 80.0–100.0)
Platelets: 293 10*3/uL (ref 150–400)
RBC: 3.09 MIL/uL — ABNORMAL LOW (ref 3.87–5.11)
RDW: 15.1 % (ref 11.5–15.5)
WBC: 7.5 10*3/uL (ref 4.0–10.5)
nRBC: 0 % (ref 0.0–0.2)

## 2021-08-08 LAB — TROPONIN I (HIGH SENSITIVITY)
Troponin I (High Sensitivity): 15 ng/L (ref <18)
Troponin I (High Sensitivity): 16 ng/L (ref <18)

## 2021-08-08 LAB — BRAIN NATRIURETIC PEPTIDE: B Natriuretic Peptide: 1032.5 pg/mL — ABNORMAL HIGH (ref 0.0–100.0)

## 2021-08-08 MED ORDER — VITAMIN D 25 MCG (1000 UNIT) PO TABS
1000.0000 [IU] | ORAL_TABLET | Freq: Every day | ORAL | Status: DC
  Administered 2021-08-09 – 2021-08-15 (×7): 1000 [IU] via ORAL
  Filled 2021-08-08 (×7): qty 1

## 2021-08-08 MED ORDER — MOMETASONE FURO-FORMOTEROL FUM 200-5 MCG/ACT IN AERO
2.0000 | INHALATION_SPRAY | Freq: Two times a day (BID) | RESPIRATORY_TRACT | Status: DC
  Administered 2021-08-09 – 2021-08-15 (×14): 2 via RESPIRATORY_TRACT
  Filled 2021-08-08: qty 8.8

## 2021-08-08 MED ORDER — ALBUTEROL SULFATE (2.5 MG/3ML) 0.083% IN NEBU: 2.5000 mg | INHALATION_SOLUTION | Freq: Four times a day (QID) | RESPIRATORY_TRACT | Status: DC | PRN

## 2021-08-08 MED ORDER — GLUCOSAMINE-CHONDROIT-COLLAGEN 250-200-116.67 MG PO CAPS: 1.0000 | ORAL_CAPSULE | Freq: Every day | ORAL | Status: DC

## 2021-08-08 MED ORDER — AMIODARONE HCL 200 MG PO TABS
200.0000 mg | ORAL_TABLET | Freq: Every day | ORAL | Status: DC
Start: 2021-08-09 — End: 2021-08-16
  Administered 2021-08-09 – 2021-08-15 (×6): 200 mg via ORAL
  Filled 2021-08-08 (×7): qty 1

## 2021-08-08 MED ORDER — APIXABAN 2.5 MG PO TABS
2.5000 mg | ORAL_TABLET | Freq: Two times a day (BID) | ORAL | Status: DC
  Filled 2021-08-08 (×2): qty 1

## 2021-08-08 MED ORDER — FUROSEMIDE 10 MG/ML IJ SOLN
40.0000 mg | Freq: Once | INTRAMUSCULAR | Status: AC
  Administered 2021-08-08: 40 mg via INTRAVENOUS
  Filled 2021-08-08: qty 4

## 2021-08-08 MED ORDER — POTASSIUM CHLORIDE CRYS ER 10 MEQ PO TBCR
10.0000 meq | EXTENDED_RELEASE_TABLET | Freq: Two times a day (BID) | ORAL | Status: DC
  Administered 2021-08-09: 10 meq via ORAL
  Filled 2021-08-08 (×2): qty 1

## 2021-08-08 MED ORDER — PANTOPRAZOLE SODIUM 40 MG PO TBEC: 40.0000 mg | DELAYED_RELEASE_TABLET | Freq: Every day | ORAL | Status: DC

## 2021-08-08 MED ORDER — POLYVINYL ALCOHOL 1.4 % OP SOLN
1.0000 [drp] | Freq: Every day | OPHTHALMIC | Status: DC
  Administered 2021-08-09 – 2021-08-15 (×7): 1 [drp] via OPHTHALMIC
  Filled 2021-08-08: qty 15

## 2021-08-08 MED ORDER — FUROSEMIDE 10 MG/ML IJ SOLN
20.0000 mg | Freq: Three times a day (TID) | INTRAMUSCULAR | Status: DC
  Administered 2021-08-09: 20 mg via INTRAVENOUS
  Filled 2021-08-08: qty 4

## 2021-08-08 MED ORDER — ROSUVASTATIN CALCIUM 10 MG PO TABS
40.0000 mg | ORAL_TABLET | Freq: Every day | ORAL | Status: DC
  Administered 2021-08-09 – 2021-08-15 (×7): 40 mg via ORAL
  Filled 2021-08-08: qty 2
  Filled 2021-08-08 (×7): qty 4

## 2021-08-08 MED ORDER — ACETAMINOPHEN 500 MG PO TABS
500.0000 mg | ORAL_TABLET | Freq: Every day | ORAL | Status: DC | PRN
  Administered 2021-08-15: 500 mg via ORAL
  Filled 2021-08-08: qty 1

## 2021-08-08 MED ORDER — PANTOPRAZOLE SODIUM 40 MG PO TBEC
40.0000 mg | DELAYED_RELEASE_TABLET | Freq: Two times a day (BID) | ORAL | Status: DC
  Administered 2021-08-09 – 2021-08-15 (×15): 40 mg via ORAL
  Filled 2021-08-08 (×15): qty 1

## 2021-08-08 NOTE — Telephone Encounter (Signed)
Patient says she is real weak and short of breathe, was hospitalized for melena and acute Kidney injury on 07/30/2021 and at [facility] on 07/07/2021 for symptomatic anemia and GI bleed, patient called into day short of breathe complaining of fatigue and stating she feels the same as when hospitalized on 07/07/2021 and 07/30/2021 that she believes she has gained weight since being DC and just feels tired and Short of breathe,( patient is pausing between words) patient stools are Black in color again she says this had stopped but now returned to black in color,  advised she needs to return to the hospital, patient stated she would call daughter in law to take her, I advised her I would notify the nurse at [facility] called nurse line at [phone] had to leave message no answer so called main office just wanting someone with patient until she could be transported to ED there was no answer at either number. Called patient and advised to call family member or EMS and return to ED at patient refused EMS stated she would call daughter-in-law and return to ED she advised her daughter -in - law would be there within the hour I advised patient to just rest until her daughter-in-law arrives. I would call back to confirm patient headed to ED. ?

## 2021-08-08 NOTE — ED Provider Notes (Signed)
? ?[facility] ?Provider Note ? ? ? Event Date/Time  ? First MD Initiated Contact with Patient 08/08/21 1837   ?  (approximate) ? ? ?History  ? ?Shortness of Breath ? ? ?HPI ? ?April [name] is a 81 y.o. female with a history of GERD, hypertension, diabetes, A-fib, CHF who comes ED complaining of worsening shortness of breath with exertion.  No chest pain.  She has been compliant with her Eliquis as well as Lasix 40 mg twice daily, but notes a 15 pound weight gain over the last month.  Denies orthopnea. ?  ? ? ?Physical Exam  ? ?Triage Vital Signs: ?ED Triage Vitals  ?Enc Vitals Group  ?   BP 08/08/21 1742 (!) 126/44  ?   Pulse Rate 08/08/21 1742 (!) 54  ?   Resp 08/08/21 1742 18  ?   Temp 08/08/21 1742 98.1 ?F (36.7 ?C)  ?   Temp Source 08/08/21 1742 Oral  ?   SpO2 08/08/21 1742 98 %  ?   Weight 08/08/21 1741 205 lb (93 kg)  ?   Height 08/08/21 1741 5' (1.524 m)  ?   Head Circumference --   ?   Peak Flow --   ?   Pain Score 08/08/21 1741 0  ?   Pain Loc --   ?   Pain Edu? --   ?   Excl. in [place]? --   ? ? ?Most recent vital signs: ?Vitals:  ? 08/08/21 2100 08/08/21 2200  ?BP:    ?Pulse: (!) 52   ?Resp: 20 19  ?Temp:    ?SpO2: 97%   ? ? ? ?General: Awake, no distress.  ?CV:  Good peripheral perfusion.  Bradycardia, rate of 55.  Normal heart sounds, distal pulses ?Resp:  Normal effort.  Mild basilar crackles.  Good air movement.  No wheezing ?Abd:  No distention.  Soft and nontender ?Other:  2+ pitting edema bilateral lower extremities, symmetric.  No significant calf tenderness. ? ? ?ED Results / Procedures / Treatments  ? ?Labs ?(all labs ordered are listed, but only abnormal results are displayed) ?Labs Reviewed  ?BASIC METABOLIC PANEL - Abnormal; Notable for the following components:  ?    Result Value  ? Glucose, Bld 124 (*)   ? BUN 29 (*)   ? Creatinine, Ser 1.87 (*)   ? Calcium 8.2 (*)   ? GFR, Estimated 27 (*)   ? All other components within normal limits  ?CBC - Abnormal; Notable for the  following components:  ? RBC 3.09 (*)   ? Hemoglobin 9.1 (*)   ? HCT 29.1 (*)   ? All other components within normal limits  ?BRAIN NATRIURETIC PEPTIDE - Abnormal; Notable for the following components:  ? B Natriuretic Peptide 1,032.5 (*)   ? All other components within normal limits  ?TYPE AND SCREEN  ?TROPONIN I (HIGH SENSITIVITY)  ?TROPONIN I (HIGH SENSITIVITY)  ? ? ? ?EKG ? ?Interpreted by me ?Sinus bradycardia, rate of 55.  Right axis, left bundle branch block.  Normal ST segments.  There is slight inferior and lateral T wave inversions which are old compared to previous EKGs from March 2023. ? ? ?RADIOLOGY ?Chest x-ray viewed and interpreted by me, shows small bilateral pleural effusion, left greater than right.  Radiology report reviewed. ? ? ? ?PROCEDURES: ? ?Critical Care performed: No ? ?Procedures ? ? ?MEDICATIONS ORDERED IN ED: ?Medications  ?furosemide (LASIX) injection 40 mg (40 mg Intravenous Given 08/08/21  1911)  ? ? ? ?IMPRESSION / MDM / ASSESSMENT AND PLAN / ED COURSE  ?I reviewed the triage vital signs and the nursing notes. ?             ?               ? ?Differential diagnosis includes, but is not limited to, heart failure exacerbation, non-STEMI, electrolyte abnormality, anemia, renal insufficiency ? ? ?Patient presents with decreased exercise tolerance and worsened shortness of breath with exertion and 15 pound weight gain over the past month.  EKG is unchanged, chest x-ray suggests some mild pulmonary edema.  Troponins are normal, BNP is elevated, other serum labs unremarkable.  Patient given IV Lasix in the ED and had output of 500 mL.  We will continue to monitor for symptom improvement.  ?Clinical Course as of 08/08/21 2232  ?Mon Aug 08, 2021  ?2221 Patient has had 500 mL of urine output after IV Lasix, but still feeling substantially short of breath.  With minimal exertion oxygen saturation drops to 90% on room air.  Due to being severely symptomatic and failure to improve with outpatient  management, I recommended hospitalization which patient agrees. [PS]  ?  ?Clinical Course User Index ?[PS] Carrie Mew, MD  ? ? ? ?FINAL CLINICAL IMPRESSION(S) / ED DIAGNOSES  ? ?Final diagnoses:  ?Acute on chronic congestive heart failure, unspecified heart failure type ([place])  ? ? ? ?Rx / DC Orders  ? ?ED Discharge Orders   ? ? None  ? ?  ? ? ? ?Note:  This document was prepared using Dragon voice recognition software and may include unintentional dictation errors. ?  ?Carrie Mew, MD ?08/08/21 2232 ? ?

## 2021-08-08 NOTE — Telephone Encounter (Signed)
RTN pt. Call and she stated she will call us back to reschedule appt. ?

## 2021-08-08 NOTE — ED Triage Notes (Signed)
Pt to ED via POV from home. Pt called PCP and was referred to the ED. Pt reports SOB. Pt recently seen and discharged for GI bleed with unidentified source. Pt also states her BM this morning looked like it may have had blood in it. Pt denies CP, fevers, N/V. Pt reports CPAP at night. Pt on Eliquis. Pt also reports since March 9th she gained 15lbs.  ?

## 2021-08-08 NOTE — Progress Notes (Signed)
PHARMACIST - PHYSICIAN ORDER COMMUNICATION ? ?CONCERNING: P&T Medication Policy on Herbal Medications ? ?DESCRIPTION:  This patient?s order for:  Glucosamine-Chondroitin Collagen Capsules has been noted. ? ?This product(s) is classified as an ?herbal? or natural product. ?Due to a lack of definitive safety studies or FDA approval, nonstandard manufacturing practices, plus the potential risk of unknown drug-drug interactions while on inpatient medications, the Pharmacy and Therapeutics Committee does not permit the use of ?herbal? or natural products of this type within [facility]. ?  ?ACTION TAKEN: ?The pharmacy department is unable to verify this order at this time and your patient has been informed of this safety policy. ?Please reevaluate patient?s clinical condition at discharge and address if the herbal or natural product(s) should be resumed at that time. ? ?

## 2021-08-08 NOTE — Telephone Encounter (Signed)
In ER for evaluation.  ?

## 2021-08-08 NOTE — H&P (Addendum)
History and Physical  April [name] WUJ:[id number] DOB: Sep 13, 1940 DOA: 08/08/2021  Referring physician: Dr. Scotty Court, EDP  PCP: Dale Ulmer, MD  Outpatient Specialists: Cardiology, GI, vascular surgery. Patient coming from: Home  Chief Complaint: Shortness of breath.  HPI: April [name] is a 81 y.o. female with medical history significant for paroxysmal A-fib on Eliquis, HFpEF, coronary artery disease status post PCI of RCA in 2017, essential hypertension, OSA on CPAP, chronic lower extremity edema, CKD 3B, hyperlipidemia, GERD, type 2 diabetes, recently diagnosed erosive gastritis (EGD 07/10/21) on twice daily PPI, who presented to [facility] ED with complaints of gradually worsening shortness of breath of 1 month duration.  Associated with 15 pounds weight gain, and abdominal distention within 2 weeks.  Patient initially called her PCP who recommended that she comes to the ED for further evaluation.    Upon presentation to the ED patient is noted to be significantly volume overload on exam, from lower extremities to abdomen.  Additionally, labs revealed elevated BNP greater than 1,000.  Chest x-ray with findings suggestive of pulmonary edema and new small bilateral pleural effusions.  She was started on IV diuresing in the ED, not hypoxic.  EDP requested admission for further evaluation and management of her present condition.  Admitted by hospitalist service, TRH.  ED Course: Temperature 98.1.  BP 120/62, pulse 64, O2 saturation 96% on room air.  Lab studies significant for elevated creatinine 1.67 with GFR 27, baseline creatinine 1.36 with GFR of 39.  BNP 1032.5.  Hemoglobin 9.1, at baseline.  MCV 94.  WBC 7.5, platelet 293.  Review of Systems: Review of systems as noted in the HPI. All other systems reviewed and are negative.   Past Medical History:  Diagnosis Date   Anemia    CAD S/P PCI pRCA Promus DES 3.5 x 16 (4.1 mm), ostRPDA Promus DES 2.5 x 12 (2.7 mm) 03/25/2016   CAD S/P  percutaneous coronary angioplasty    a. 07/2015 MV: No ischemia, EF 66%;  b. 03/2016 Inflat STEMI/PCI: LM nl, LAD 25d, RI nl, LCX nl, OM1/2 nl, RCA 80p (3.5x16 Promus Premier DES), 50p/m, 30d, RPDA 90 (2.5x12 Promu Premier DES); c. 01/2018 Cath ([place] [facility] [place], [place]): Patent RCA stents->Med Rx.   CKD (chronic kidney disease), stage III (HCC)    Diastolic dysfunction    a. 10/2013 Echo: EF 55-65%, Gr1 DD; b. 01/2018 Echo ([place] [facility] [place], [place]): EF 55-60%, Gr2 DD, RVSP .   Diverticulitis    Dyspnea    Endometriosis    Family history of adverse reaction to anesthesia    Mother - had to stay in ICU because of breathing difficulties every time she had anesthesia   GERD (gastroesophageal reflux disease)    GI bleed    a. 06/2021 - melena-->2 u PRBCs.   Hiatal hernia    History of colon polyps 08/1994   History of Migraines    Hypercholesterolemia    Hypertension    LBBB (left bundle branch block)    Osteoporosis    PAF (paroxysmal atrial fibrillation) (HCC)    a. 03/2016 Dx @ time of MI; b. CHA2DS2VASc = 5-->Eliquis.   Rheumatic fever    Sleep apnea    CPAP   Spasmodic dysphonia    ST elevation myocardial infarction (STEMI) of inferolateral wall, initial episode of care (HCC) 03/25/2016   ST elevation myocardial infarction (STEMI) of inferolateral wall, initial episode of care (HCC) 03/25/2016   Urine incontinence    Past Surgical History:  Procedure Laterality Date   ABDOMINAL HYSTERECTOMY     ovaries not removed   APPENDECTOMY     was removed during hysterectomy   Breast biopsies     x2   BREAST EXCISIONAL BIOPSY Bilateral "years ago"   neg   BREAST SURGERY Bilateral    BROW LIFT Bilateral 08/15/2019   Procedure: BLEPHAROPLASTY UPPER EYELID; W/EXCESS SKIN BLEPHAROPTOSIS REPAIR; RESECT EX;  Surgeon: Imagene Riches, MD;  [facility];  Service: Ophthalmology;  Laterality: Bilateral;  sleep apnea   CARDIAC CATHETERIZATION  2011   moderate 40% RCA  disease   CARDIAC CATHETERIZATION  01/2010   Dr. Hoy Register : Only noted 40% RCA   CARDIAC CATHETERIZATION N/A 03/25/2016   Procedure: Left Heart Cath and Coronary Angiography;  Surgeon: Marykay Lex, MD;  Location: [facility] INVASIVE CV LAB;  Service: Cardiovascular;  Laterality: N/A;   CARDIAC CATHETERIZATION N/A 03/25/2016   Procedure: Coronary Stent Intervention;  Surgeon: Marykay Lex, MD;  Location: [facility] INVASIVE CV LAB;  Service: Cardiovascular;  Laterality: N/A;   COLONOSCOPY  2013   COLONOSCOPY WITH PROPOFOL N/A 07/11/2021   Procedure: COLONOSCOPY WITH PROPOFOL;  Surgeon: Midge Minium, MD;  Location: [facility] ENDOSCOPY;  Service: Endoscopy;  Laterality: N/A;   ESOPHAGOGASTRODUODENOSCOPY (EGD) WITH PROPOFOL N/A 03/17/2015   Procedure: ESOPHAGOGASTRODUODENOSCOPY (EGD) WITH PROPOFOL;  Surgeon: Kieth Brightly, MD;  Location: [facility] ENDOSCOPY;  Service: Gastroenterology;  Laterality: N/A;   ESOPHAGOGASTRODUODENOSCOPY (EGD) WITH PROPOFOL N/A 07/10/2021   Procedure: ESOPHAGOGASTRODUODENOSCOPY (EGD) WITH PROPOFOL;  Surgeon: Toney Reil, MD;  Location: [facility] ENDOSCOPY;  Service: Gastroenterology;  Laterality: N/A;   GIVENS CAPSULE STUDY N/A 07/11/2021   Procedure: GIVENS CAPSULE STUDY;  Surgeon: Midge Minium, MD;  Location: [name] ENDOSCOPY;  Service: Endoscopy;  Laterality: N/A;   NM MYOVIEW ([facility] HX)  07/2015   No evidence ischemia or infarction. EF 66%. Low risk   TONSILLECTOMY     TRANSTHORACIC ECHOCARDIOGRAM  10/2013   Normal LV size and function. EF 55-65%. GR 1 DD. Otherwise normal.    Social History:  reports that she has never smoked. She has never used smokeless tobacco. She reports that she does not drink alcohol and does not use drugs.   Allergies  Allergen Reactions   Penicillins Other (See Comments)    Okay to take amoxicillin/(pt does not recall what the reaction to penicillin was (13-31 years old)    Sulfa Antibiotics Rash    Family History  Problem Relation Age of  Onset   Arthritis Mother    Stroke Mother    Hypertension Mother    Osteoporosis Mother    Heart disease Father        MI   Heart attack Father    Stroke Brother    Heart attack Sister    Breast cancer Other        first cousin x 2   Stroke Daughter    Heart attack Daughter    Colon cancer Neg Hx       Prior to Admission medications   Medication Sig Start Date End Date Taking? Authorizing Provider  acetaminophen (TYLENOL) 500 MG tablet Take 500 mg by mouth daily as needed for headache (pain).   Yes [provider]  albuterol (PROVENTIL HFA;VENTOLIN HFA) 108 (90 Base) MCG/ACT inhaler Inhale 2 puffs into the lungs every 6 (six) hours as needed for wheezing or shortness of breath. 04/22/18  Yes Erin Fulling, MD  amiodarone (PACERONE) 200 MG tablet TAKE 1 TABLET BY MOUTH DAILY 06/30/21  Yes Gollan, Tollie Pizza, MD  apixaban (ELIQUIS) 2.5 MG TABS tablet Take 1 tablet (2.5 mg total) by mouth 2 (two) times daily. 08/05/21  Yes Dunn, Raymon Mutton, PA-C  Azelastine HCl 137 MCG/SPRAY SOLN USE 1 TO 2 SPRAYS IN EACH NOSTRIL TWICE A DAY AS NEEDED FOR DRAINAGE 01/20/20  Yes Dale [place], MD  cholecalciferol (VITAMIN D3) 25 MCG (1000 UNIT) tablet Take 1,000 Units by mouth daily.   Yes [provider]  fluticasone-salmeterol (ADVAIR HFA) 230-21 MCG/ACT inhaler Inhale 2 puffs into the lungs 2 (two) times daily. 10/20/20  Yes Erin Fulling, MD  furosemide (LASIX) 40 MG tablet Take 1 tablet (40 mg total) by mouth 2 (two) times daily. Dr. Mariah Milling advised alternate 40 mg twice a day with 40 mg daily every other day 12/08/20  Yes Antonieta Iba, MD  Glucosamine-Chondroit-Collagen [phone] MG CAPS Take 1 tablet by mouth in the morning and at bedtime.   Yes [provider]  hydrALAZINE (APRESOLINE) 10 MG tablet TAKE ONE TABLET BY MOUTH THREE TIMES A DAY 04/15/21  Yes Gollan, Tollie Pizza, MD  mupirocin ointment (BACTROBAN) 2 % Apply 1 application. topically 2 (two) times daily.   Yes  [provider]  pantoprazole (PROTONIX) 40 MG tablet Take 1 tablet (40 mg total) by mouth daily. 07/12/21  Yes Arnetha Courser, MD  Polyethyl Glycol-Propyl Glycol (SYSTANE OP) Place 1 drop into both eyes daily as needed (dry eyes).   Yes [provider]  potassium chloride (KLOR-CON) 10 MEQ tablet TAKE 1 TABLET BY MOUTH TWICE A DAY 06/30/21  Yes Gollan, Tollie Pizza, MD  propranolol (INDERAL) 20 MG tablet Take 1 tablet (20 mg total) by mouth 3 (three) times daily. 07/29/21 10/27/21 Yes Creig Hines, NP  Propylene Glycol (SYSTANE COMPLETE) 0.6 % SOLN Apply 1 drop to eye daily.   Yes [provider]  rosuvastatin (CRESTOR) 40 MG tablet TAKE 1 TABLET BY MOUTH DAILY 05/25/21  Yes Gollan, Tollie Pizza, MD  traMADol (ULTRAM) 50 MG tablet Take by mouth every 6 (six) hours as needed.   Yes [provider]  nitroGLYCERIN (NITROSTAT) 0.4 MG SL tablet Place 1 tablet (0.4 mg total) under the tongue every 5 (five) minutes as needed. 04/22/18   Antonieta Iba, MD  ondansetron (ZOFRAN-ODT) 4 MG disintegrating tablet Take 4 mg by mouth every 8 (eight) hours as needed. Patient not taking: Reported on 08/08/2021 07/26/21   [provider]    Physical Exam: BP (!) 126/44 (BP Location: Left Arm)   Pulse (!) 52   Temp 98.1 F (36.7 C) (Oral)   Resp 19   Ht 5' (1.524 m)   Wt 93 kg   LMP 05/01/1972   SpO2 97%   BMI 40.04 kg/m   General: 81 y.o. year-old female well developed well nourished in no acute distress.  Alert and oriented x3. Cardiovascular: Regular rate and rhythm with no rubs or gallops.  No thyromegaly. + R JVD noted.  3+ pitting edema in lower extremity bilaterally. Respiratory: Mild rales at bases with no wheezing.  Poor inspiratory effort. Abdomen: Soft non tender, distended with normal bowel sounds x4 quadrants. Muskuloskeletal: No cyanosis or clubbing.  3+ pitting edema noted in lower extremity bilaterally Neuro: CN II-XII intact, strength,  sensation, reflexes Skin: No ulcerative lesions noted or rashes Psychiatry: Judgement and insight appear normal. Mood is appropriate for condition and setting          Labs on Admission:  Basic Metabolic Panel: Recent Labs  Lab 08/08/21 1745  NA 136  K 3.9  CL 104  CO2 25  GLUCOSE 124*  BUN 29*  CREATININE 1.87*  CALCIUM 8.2*   Liver Function Tests: No results for input(s): AST, ALT, ALKPHOS, BILITOT, PROT, ALBUMIN in the last 168 hours. No results for input(s): LIPASE, AMYLASE in the last 168 hours. No results for input(s): AMMONIA in the last 168 hours. CBC: Recent Labs  Lab 08/08/21 1745  WBC 7.5  HGB 9.1*  HCT 29.1*  MCV 94.2  PLT 293   Cardiac Enzymes: No results for input(s): CKTOTAL, CKMB, CKMBINDEX, TROPONINI in the last 168 hours.  BNP (last 3 results) Recent Labs    08/08/21 1745  BNP 1,032.5*    ProBNP (last 3 results) No results for input(s): PROBNP in the last 8760 hours.  CBG: No results for input(s): GLUCAP in the last 168 hours.  Radiological Exams on Admission: DG Chest 2 View  Result Date: 08/08/2021 CLINICAL DATA:  Shortness of breath. EXAM: CHEST - 2 VIEW COMPARISON:  07/06/2021 FINDINGS: Stable heart size. Stable appearance large hiatal hernia. Lungs demonstrate low lung volumes with new small bilateral pleural effusions and suggestion of mild pulmonary interstitial edema. No pneumothorax or focal airspace consolidation. IMPRESSION: 1. Suggestion of mild pulmonary interstitial edema with new small bilateral pleural effusions. 2. Stable large hiatal hernia. Electronically Signed   By: Irish Lack M.D.   On: 08/08/2021 18:44    EKG: I independently viewed the EKG done and my findings are as followed: Sinus bradycardia rate of 55.  Nonspecific ST-T changes.  QTc 484.  Assessment/Plan Present on Admission:  Acute on chronic diastolic CHF (congestive heart failure) (HCC)  Principal Problem:   Acute on chronic diastolic CHF (congestive  heart failure) (HCC)  Acute on chronic diastolic CHF Presented with volume overload, bilateral lower extremity pitting edema, edema up to abdomen. BNP greater than 1,000 Chest x-ray with findings of pulmonary edema and small bilateral pleural effusions. Started IV diuresing in the ED, continue Obtain 2D echo Last 2D echo done on 11/21/2013 showed LVEF 55 to 65% with grade 1 diastolic dysfunction. Start strict I's and O's and daily weight  Bilateral pleural effusions, suspect cardiogenic Not hypoxic with O2 saturation ranging between 90% with activity and 96% at rest, in the ED. Continue diuresing, incentive spirometer, mobilize as tolerated. Follow 2D echo  AKI on CKD 3B Baseline creatinine appears to be 1.3 with GFR of 39 Presented with creatinine of 1.87 with GFR 27 Avoid nephrotoxic agents and hypotension Closely monitor renal function while on IV diuretics Closely monitor urine output with strict I's and Os Repeat chemistry panel in the morning  Paroxysmal A-fib on Eliquis Resume home amiodarone and home Eliquis Monitor on telemetry  Sinus bradycardia Currently heart rate in the 50s Continue to hold off home propranolol Last TSH, normal, 2.21 on 07/07/2021. Monitor on telemetry.  History of upper GI bleed Recently diagnosed erosive gastritis (EGD 07/10/21) on twice daily PPI Per cardiology's office note okay to restart low-dose Eliquis 2.5 mg twice daily. Continue twice daily PPI x30 days as recommended by GI.  Anemia of chronic disease Hemoglobin 9.1K at baseline No overt bleeding Continue to monitor H&H  GERD Resume home PPI twice daily She follows with GI outpatient.  Severe morbid obesity BMI 40 Recommend weight loss outpatient with regular physical activity and healthy dieting.  Physical debility PT OT to assess Fall precautions.  Critical care time: 65 minutes.   DVT prophylaxis: Eliquis  Code Status:  Full code  Family Communication:  Daughter-in-law at bedside.  Disposition Plan: Admit to telemetry cardiac unit  Consults called: None  Admission status: Inpatient status.   Status is: Inpatient  Patient will require least 2 midnights for further evaluation and treatment of present condition.  Significantly volume overload requiring IV diuretics for more than 2 midnights.    Darlin Drop MD [facility] Pager [phone]  If 7PM-7AM, please contact night-coverage [email or web address] Password [facility]  08/08/2021, 10:37 PM

## 2021-08-09 ENCOUNTER — Inpatient Hospital Stay: Admit: 2021-08-09 | Payer: Medicare Other

## 2021-08-09 ENCOUNTER — Encounter: Payer: Self-pay | Admitting: Internal Medicine

## 2021-08-09 DIAGNOSIS — I2510 Atherosclerotic heart disease of native coronary artery without angina pectoris: Secondary | ICD-10-CM | POA: Diagnosis not present

## 2021-08-09 DIAGNOSIS — D6489 Other specified anemias: Secondary | ICD-10-CM | POA: Diagnosis not present

## 2021-08-09 DIAGNOSIS — I5033 Acute on chronic diastolic (congestive) heart failure: Secondary | ICD-10-CM | POA: Diagnosis not present

## 2021-08-09 LAB — BASIC METABOLIC PANEL
Anion gap: 9 (ref 5–15)
BUN: 29 mg/dL — ABNORMAL HIGH (ref 8–23)
CO2: 24 mmol/L (ref 22–32)
Calcium: 8.2 mg/dL — ABNORMAL LOW (ref 8.9–10.3)
Chloride: 105 mmol/L (ref 98–111)
Creatinine, Ser: 1.79 mg/dL — ABNORMAL HIGH (ref 0.44–1.00)
GFR, Estimated: 28 mL/min — ABNORMAL LOW (ref >60)
Glucose, Bld: 110 mg/dL — ABNORMAL HIGH (ref 70–99)
Potassium: 3.6 mmol/L (ref 3.5–5.1)
Sodium: 138 mmol/L (ref 135–145)

## 2021-08-09 LAB — CBC WITH DIFFERENTIAL/PLATELET
Abs Immature Granulocytes: 0.01 10*3/uL (ref 0.00–0.07)
Basophils Absolute: 0 10*3/uL (ref 0.0–0.1)
Basophils Relative: 1 %
Eosinophils Absolute: 0.1 10*3/uL (ref 0.0–0.5)
Eosinophils Relative: 2 %
HCT: 26.3 % — ABNORMAL LOW (ref 36.0–46.0)
Hemoglobin: 8.2 g/dL — ABNORMAL LOW (ref 12.0–15.0)
Immature Granulocytes: 0 %
Lymphocytes Relative: 35 %
Lymphs Abs: 2.4 10*3/uL (ref 0.7–4.0)
MCH: 29.1 pg (ref 26.0–34.0)
MCHC: 31.2 g/dL (ref 30.0–36.0)
MCV: 93.3 fL (ref 80.0–100.0)
Monocytes Absolute: 1.2 10*3/uL — ABNORMAL HIGH (ref 0.1–1.0)
Monocytes Relative: 17 %
Neutro Abs: 3.2 10*3/uL (ref 1.7–7.7)
Neutrophils Relative %: 45 %
Platelets: 246 10*3/uL (ref 150–400)
RBC: 2.82 MIL/uL — ABNORMAL LOW (ref 3.87–5.11)
RDW: 14.9 % (ref 11.5–15.5)
WBC: 7 10*3/uL (ref 4.0–10.5)
nRBC: 0 % (ref 0.0–0.2)

## 2021-08-09 LAB — HEPATIC FUNCTION PANEL
ALT: 25 U/L (ref 0–44)
AST: 47 U/L — ABNORMAL HIGH (ref 15–41)
Albumin: 2.4 g/dL — ABNORMAL LOW (ref 3.5–5.0)
Alkaline Phosphatase: 75 U/L (ref 38–126)
Bilirubin, Direct: 0.1 mg/dL (ref 0.0–0.2)
Indirect Bilirubin: 0.6 mg/dL (ref 0.3–0.9)
Total Bilirubin: 0.7 mg/dL (ref 0.3–1.2)
Total Protein: 5.5 g/dL — ABNORMAL LOW (ref 6.5–8.1)

## 2021-08-09 LAB — MAGNESIUM: Magnesium: 2.2 mg/dL (ref 1.7–2.4)

## 2021-08-09 LAB — PHOSPHORUS: Phosphorus: 4 mg/dL (ref 2.5–4.6)

## 2021-08-09 MED ORDER — FUROSEMIDE 10 MG/ML IJ SOLN
60.0000 mg | Freq: Every day | INTRAMUSCULAR | Status: DC
  Administered 2021-08-09: 60 mg via INTRAVENOUS
  Filled 2021-08-09: qty 8

## 2021-08-09 MED ORDER — POTASSIUM CHLORIDE CRYS ER 20 MEQ PO TBCR
40.0000 meq | EXTENDED_RELEASE_TABLET | Freq: Once | ORAL | Status: AC
  Administered 2021-08-09: 40 meq via ORAL
  Filled 2021-08-09: qty 2

## 2021-08-09 NOTE — Progress Notes (Signed)
Assumed care of patient at 1900. A&O x4. CPAP on overnight. Ambulating w/ assistance to BR, c/o not emptying her bladder. Bladder scan obtained revealing 87 mL in which she voided right after. Medication administration per MAR. Full assessment per flowsheets. Call bell within reach, making needs known. Comfort and safety maintained.  ?

## 2021-08-09 NOTE — Progress Notes (Signed)
Patient OOB in recliner, lunch tray delivered.  ?

## 2021-08-09 NOTE — Progress Notes (Signed)
Transport requested to take patient to ready bed.  ?

## 2021-08-09 NOTE — Consult Note (Signed)
? ? ? ?Cardiology Consultation:  ? ?Patient ID: April [name]; [id number]; 06/01/1940  ? ?Admit date: 08/08/2021 ?Date of Consult: 08/09/2021 ? ?Primary Care Provider: Einar Pheasant, MD ?Primary Cardiologist: Rockey Situ ?Primary Electrophysiologist:  None ? ? ?Patient Profile:  ? ?April [name] is a 81 y.o. female with a hx of CAD s/p prior RCA and pDA stenting, HFpEF, PAF on Eliquis, anemia with recurrent GI bleeding, CKD stage III, HTN, HLD, and sleep apnea who is being seen today for the evaluation of volume overload at the request of Dr. Priscella Mann. ? ?History of Present Illness:  ? ?Ms. Fallas was admitted to the hospital in 01/2018 in [place] with chest discomfort and mild troponin elevation.  Echo demonstrated normal LV systolic function with grade 2 diastolic dysfunction.  LHC showed patent RCA stents and otherwise no targets for intervention.  Over the years, she has had issues with chronic lower extremity swelling and lymphedema for which she has been using wraps and lymphedema pumps. ? ?She was admitted to [facility] in 06/2021 with symptomatic anemia with melena. HGB dropped to 7.2.  She required 2 units pRBC and Eliquis was held. EGD was performed and showed mildly erythematous gastric mucosa without obvious bleeding.  This was followed by colonoscopy which showed no source of bleeding, polyps (pathology negative for malignancy), nonbleeding internal hemorrhoids, and diverticulosis.Capsule endoscopy was subsequently carried out.  EGD biopsies were negative for H. pylori. She was discharged home off of Eliquis. She was seen by GI on 07/21/2021, at which time H&H were stable. Note indicates that she was cleared to resume anticoagulation.  ? ?She was seen in our office on 07/26/2021, and was noted to be volume up with associated lower extremity swelling and dyspnea.  She reported a 10 pound weight gain.  It was noted she remained net positive throughout her prior admission in the context of IV fluids, PRBC, and  holding of diuretic therapy because of AKI.  She had been alternating Lasix 40 mg daily with 40 mg twice daily without significant improvement in symptoms.  In an effort to attempt to prevent readmission, her Lasix was titrated for 3 days to 80 mg twice daily followed by 40 mg twice daily thereafter.  She was restarted on Eliquis 5 mg twice daily.  She was treated for lower extremity cellulitis as well. ? ?She was admitted to [facility] earlier this month with recurrent melena as well as a small amount of blood-streaked reflux leading her Eliquis to be held at that time. She was evaluated by GI and underwent enteroscopy and capsule endoscopy which showed a large hiatal hernia, patchy erythematous and petechial mucosa with erosion in the stomach, the duodenum, and jejunum appeared normal.  H. pylori stool antigen was negative. GI subsequently cleared the patient to resume Eliquis, though this was held at discharge, at patient request. She contacted our office on 4/6 concerned about this, after discussion with her primary cardiologist, it was recommended she resume Eliquis at 2.5 mg bid (now met reduced dosing criteria), and hold ASA.  ? ?She was admitted to [facility] on 08/08/2021 with acute on chronic HFpEF, recurrent melena, and anemia.  She reported an approximate 15 to 20 pound weight gain.  High sensitivity troponin 15 with a delta troponin of 16. BNP 1032. HGB 8.2, which is similar to her discharge value at [facility] of 8.8. BUN/SCr 29/1.87-29/1.79.  EKG showed sinus bradycardia with known left bundle branch block.  Chest x-ray showed mild pulmonary interstitial edema with new  small bilateral pleural effusions as well as a stable large hiatal hernia.  In the ED, she received IV Lasix 40 mg and was placed on IV Lasix 60 mg daily upon admission.  Documented urine output approximately 1 L for the admission to date. ? ? ? ?Past Medical History:  ?Diagnosis Date  ? Anemia   ? CAD S/P PCI pRCA Promus DES 3.5 x 16 (4.1  mm), ostRPDA Promus DES 2.5 x 12 (2.7 mm) 03/25/2016  ? CAD S/P percutaneous coronary angioplasty   ? a. 07/2015 MV: No ischemia, EF 66%;  b. 03/2016 [place] STEMI/PCI: LM nl, LAD 25d, RI nl, LCX nl, OM1/2 nl, RCA 80p (3.5x16 Promus Premier DES), 50p/m, 30d, RPDA 90 (2.5x12 Promu Premier DES); c. 01/2018 Cath ([place], [place]): Patent RCA stents->Med Rx.  ? CKD (chronic kidney disease), stage III ([place])   ? Diastolic dysfunction   ? a. 10/2013 Echo: EF 55-65%, Gr1 DD; b. 01/2018 Echo ([place], [place]): EF 55-60%, Gr2 DD, RVSP 4mHg.  ? Diverticulitis   ? Dyspnea   ? Endometriosis   ? Family history of adverse reaction to anesthesia   ? Mother - had to stay in ICU because of breathing difficulties every time she had anesthesia  ? GERD (gastroesophageal reflux disease)   ? GI bleed   ? a. 06/2021 - melena-->2 u PRBCs.  ? Hiatal hernia   ? History of colon polyps 08/1994  ? History of Migraines   ? Hypercholesterolemia   ? Hypertension   ? LBBB (left bundle branch block)   ? Osteoporosis   ? PAF (paroxysmal atrial fibrillation) ([place]   ? a. 03/2016 Dx @ time of MI; b. CHA2DS2VASc = 5-->Eliquis.  ? Rheumatic fever   ? Sleep apnea   ? CPAP  ? Spasmodic dysphonia   ? ST elevation myocardial infarction (STEMI) of inferolateral wall, initial episode of care ([facility] 03/25/2016  ? ST elevation myocardial infarction (STEMI) of inferolateral wall, initial episode of care ([facility] 03/25/2016  ? Urine incontinence   ? ? ?Past Surgical History:  ?Procedure Laterality Date  ? ABDOMINAL HYSTERECTOMY    ? ovaries not removed  ? APPENDECTOMY    ? was removed during hysterectomy  ? Breast biopsies    ? x2  ? BREAST EXCISIONAL BIOPSY Bilateral "years ago"  ? neg  ? BREAST SURGERY Bilateral   ? BROW LIFT Bilateral 08/15/2019  ? Procedure: BLEPHAROPLASTY UPPER EYELID; W/EXCESS SKIN BLEPHAROPTOSIS REPAIR; RESECT EX;  Surgeon: FKarle Starch MD;  [facility] [facility]  Service: Ophthalmology;  Laterality: Bilateral;   sleep apnea  ? CARDIAC CATHETERIZATION  2011  ? moderate 40% RCA disease  ? CARDIAC CATHETERIZATION  01/2010  ? Dr. GSuzie Portela: Only noted 40% RCA  ? CARDIAC CATHETERIZATION N/A 03/25/2016  ? Procedure: Left Heart Cath and Coronary Angiography;  Surgeon: DLeonie Man MD;  [place];  Service: Cardiovascular;  Laterality: N/A;  ? CARDIAC CATHETERIZATION N/A 03/25/2016  ? Procedure: Coronary Stent Intervention;  Surgeon: DLeonie Man MD;  [place];  Service: Cardiovascular;  Laterality: N/A;  ? COLONOSCOPY  2013  ? COLONOSCOPY WITH PROPOFOL N/A 07/11/2021  ? Procedure: COLONOSCOPY WITH PROPOFOL;  Surgeon: WLucilla Lame MD;  [facility];  Service: Endoscopy;  Laterality: N/A;  ? ESOPHAGOGASTRODUODENOSCOPY (EGD) WITH PROPOFOL N/A 03/17/2015  ? Procedure: ESOPHAGOGASTRODUODENOSCOPY (EGD) WITH PROPOFOL;  Surgeon: SChristene Lye MD;  Location: [facility] ENDOSCOPY;  Service: Gastroenterology;  Laterality:  N/A;  ? ESOPHAGOGASTRODUODENOSCOPY (EGD) WITH PROPOFOL N/A 07/10/2021  ? Procedure: ESOPHAGOGASTRODUODENOSCOPY (EGD) WITH PROPOFOL;  Surgeon: Lin Landsman, MD;  [facility];  Service: Gastroenterology;  Laterality: N/A;  ? GIVENS CAPSULE STUDY N/A 07/11/2021  ? Procedure: GIVENS CAPSULE STUDY;  Surgeon: Lucilla Lame, MD;  Location: [facility] ENDOSCOPY;  Service: Endoscopy;  Laterality: N/A;  ? NM MYOVIEW ([place] H[facility])  07/2015  ? No evidence ischemia or infarction. EF 66%. Low risk  ? TONSILLECTOMY    ? TRANSTHORACIC ECHOCARDIOGRAM  10/2013  ? Normal LV size and function. EF 55-65%. GR 1 DD. Otherwise normal.  ?  ? ?Home Meds: ?Prior to Admission medications   ?Medication Sig Start Date End Date Taking? Authorizing Provider  ?acetaminophen (TYLENOL) 500 MG tablet Take 500 mg by mouth daily as needed for headache (pain).   Yes [provider]  ?albuterol (PROVENTIL HFA;VENTOLIN HFA) 108 (90 Base) MCG/ACT inhaler Inhale 2 puffs into the lungs  every 6 (six) hours as needed for wheezing or shortness of breath. 04/22/18  Yes Flora Lipps, MD  ?amiodarone (PACERONE) 200 MG tablet TAKE 1 TABLET BY MOUTH DAILY 06/30/21  Yes Gollan, Kathlene November, MD  ?apix

## 2021-08-09 NOTE — ED Notes (Signed)
Pt placed on 2L of O2 vian [place]. Pt wears CPAP at night, and she is c/o difficulty breathing. Saturations occasionally drop to 89-90% on RA ?

## 2021-08-09 NOTE — TOC Initial Note (Signed)
Transition of Care (TOC) - Initial/Assessment Note  ? ? ?Patient Details  ?Name: April [name] ?MRN: [id number] ?Date of Birth: [birth year] ? ?Transition of Care (TOC) CM/SW Contact:    ?Shelbie Hutching, RN ?Phone Number: ?08/09/2021, 12:27 PM ? ?Clinical Narrative:                 ?Patient admitted to the hospital with acute on chronic diastolic CHF.   Patient is followed outpatient by cardiologist Dr. Rockey Situ. ?RNCM met with patient at the bedside, introduced self and explained role in DC planning. ?Patient lives at [place] independent living.  She walks with a cane and drives.  She has not driven in the last couple of weeks because she has not been feeling well.  [facility] provides transportation if needed and her daughter in law brought her to the hospital.   ?Patient is current with her PCP Dr. Nicki Reaper, she reports that Dr. Nicki Reaper submitted a referral for physical therapy. ? ?HF Navigator has been at the bedside with patient this morning.   ? ?TOC will cont to follow.   ? ?Expected Discharge Plan: [place] ?Barriers to Discharge: Continued Medical Work up ? ? ?Patient Goals and CMS Choice ?Patient states their goals for this hospitalization and ongoing recovery are:: to get rid of the extra fluid ?CMS Medicare.gov Compare Post Acute Care list provided to:: Patient ?Choice offered to / list presented to : Patient ? ?Expected Discharge Plan and Services ?Expected Discharge Plan: [place] ?  ?Discharge Planning Services: CM Consult ?Post Acute Care Choice: Home Health ?Living arrangements for the past 2 months: [place], [place] ?                ?DME Arranged: N/A ?DME Agency: NA ?  ?  ?  ?  ?  ?  ?  ?  ? ?Prior Living Arrangements/Services ?Living arrangements for the past 2 months: [place], [place] ?Lives with:: Self ?Patient language and need for interpreter reviewed:: Yes ?Do you feel safe going back to the place where you live?: Yes       ?Need for Family Participation in Patient Care: Yes (Comment) ?Care giver support system in place?: Yes (comment) (son, daughter in law) ?Current home services: DME (cane) ?Criminal Activity/Legal Involvement Pertinent to Current Situation/Hospitalization: No - Comment as needed ? ?Activities of Daily Living ?  ?  ? ?Permission Sought/Granted ?Permission sought to share information with : Case Manager, Family Supports, Customer service manager ?Permission granted to share information with : Yes, Verbal Permission Granted ? Share Information with NAME: Deklynn Charlet ? Permission granted to share info w AGENCY: [facility] ? Permission granted to share info w Relationship: son ? Permission granted to share info w Contact Information: [phone] ? ?Emotional Assessment ?Appearance:: Appears stated age ?Attitude/Demeanor/Rapport: Engaged ?Affect (typically observed): Accepting ?Orientation: : Oriented to Self, Oriented to Place, Oriented to  Time, Oriented to Situation ?Alcohol / Substance Use: Not Applicable ?Psych Involvement: No (comment) ? ?Admission diagnosis:  Acute on chronic diastolic CHF (congestive heart failure) ([place]) [I50.33] ?Patient Active Problem List  ? Diagnosis Date Noted  ? Acute on chronic diastolic CHF (congestive heart failure) ([place]) 08/08/2021  ? Chronic obstructive pulmonary disease, unspecified COPD type ([place]) 08/01/2021  ? Iron deficiency anemia   ? Gastric erythema   ? Symptomatic anemia   ? GI bleeding 07/07/2021  ? Thrombophilia ([place]) 04/10/2021  ? Aortic atherosclerosis ([place]) 01/03/2021  ? Cellulitis of leg,  left 10/26/2020  ? Breast tenderness 07/14/2020  ? Abnormal liver function tests 03/11/2020  ? Breast pain 12/15/2018  ? Lymphedema 07/09/2018  ? Chronic heart failure with preserved ejection fraction ([place]) 06/03/2018  ? Acute renal failure superimposed on stage 3b chronic kidney disease ([place]) 06/03/2018  ? Lower extremity edema 01/19/2018  ? Arthropathy of lumbar facet joint  08/08/2017  ? Degeneration of lumbar intervertebral disc 08/08/2017  ? Osteoarthritis of knee 08/08/2017  ? 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  ? Paroxysmal atrial fibrillation ([place]) 03/27/2016  ? Right knee pain 11/13/2015  ? Coronary artery disease involving native coronary artery of native heart with unstable angina pectoris ([place]) 06/01/2015  ? Diabetes mellitus ([place]) 05/16/2015  ? Gastritis 04/01/2015  ? Chest pain 04/01/2015  ? Hiatal hernia 08/25/2014  ? DOE (dyspnea on exertion) 07/15/2014  ? Congestion of throat 07/06/2014  ? Health care maintenance 07/06/2014  ? Fibrocystic breast disease 07/06/2014  ? BMI 38.0-38.9,adult 04/19/2014  ? Stress 02/16/2013  ? Rib pain on right side 02/16/2013  ? SOB (shortness of breath) 10/13/2012  ? GERD (gastroesophageal reflux disease) 10/13/2012  ? Essential hypertension, benign 08/18/2012  ? Hypercholesterolemia 08/18/2012  ? History of colonic polyps 08/18/2012  ? Obstructive sleep apnea 08/18/2012  ? Osteoporosis 08/18/2012  ? ?PCP:  Einar Pheasant, MD ?Pharmacy:   ?[facility] - [place], [place] ?[place] ?[place] [address] ?Phone: [phone] Fax: [phone] ? ?[place] ?[place] [address] ?Phone: [phone] Fax: [phone] ? ?[name] [address] ?Phone: [phone] Fax: [phone] ? ? ? ? ?Social Determinants of Health (SDOH) Interventions ?  ? ?Readmission Risk Interventions ?   ? View : No data to display.  ?  ?  ?  ? ? ? ?

## 2021-08-09 NOTE — Consult Note (Addendum)
? ?  Heart Failure Nurse Navigator Note ? ?HFpEF 55 to 60%.  Grade 1 diastolic dysfunction.  Echocardiogram is pending on this admission. ? ?She presented from home with worsening shortness of breath and approximately a 15 pound weight gain also noting abdominal distention. ? ?Comorbidities: ? ?Paroxysmal atrial fibrillation ?GERD ?Anemia ?Morbid obesity ?Obstructive sleep apnea with CPAP use ?Hypertension ?Left bundle branch block ?Coronary artery disease with stenting ?Recent GI bleed ? ?Medications: ? ?Furosemide 60 mg IV daily ?Crestor 40 mg daily ?Amiodarone 200 mg daily ? ?Labs: ? ?Sodium 138, potassium 3.6, chloride 105, CO2 24, BUN 1229, creatinine 1.79, BNP 1032, magnesium 2.2, phosphorus 4 ?Weight is 93 kg ?Blood pressure 93/48 ? ?Initial meeting with patient in the ED she is sitting up in the recliner at bedside in no acute distress. ? ?She lives in independent living in [place] and receives her meals from the facility.  She does not use salt.  She also felt that her fluid intake daily was approximately 40 ounces. ? ?She states that she does not weigh herself on a daily basis.  Stressed the importance of daily weight and reporting 2 to 3 pound weight gain overnight or 5 pounds within a week or changes in her symptoms such as increased swelling or increasing shortness of breath etc. ? ?Discussed how she took her Lasix after she had seen cardiology on March 28.  She states that she took the 80 mg twice a day for 3 days and during that time did not see any improvement in her urine output.  She then went to taking it 40 mg twice a day as instructed. ? ?Discussed ventricle health with her, she really did not seem interested as she says I am not very tech savvy.  Was given the information sheet. ? ?Pricilla Riffle RN [facility] ?

## 2021-08-09 NOTE — Progress Notes (Signed)
?PROGRESS NOTE ? ? ? ?April [name]  DXI:[id number] DOB: [birth year] DOA: 08/08/2021 ?PCP: Einar Pheasant, MD  ? ? ?Brief Narrative:  ?81 y.o. female with medical history significant for paroxysmal A-fib on Eliquis, HFpEF, coronary artery disease status post PCI of RCA in 2017, essential hypertension, OSA on CPAP, chronic lower extremity edema, CKD 3B, hyperlipidemia, GERD, type 2 diabetes, recently diagnosed erosive gastritis (EGD 07/10/21) on twice daily PPI, who presented to [facility] ED with complaints of gradually worsening shortness of breath of 1 month duration.  Associated with 15 pounds weight gain, and abdominal distention within 2 weeks.  Patient initially called her PCP who recommended that she comes to the ED for further evaluation.   ?  ?Upon presentation to the ED patient is noted to be significantly volume overload on exam, from lower extremities to abdomen.  Additionally, labs revealed elevated BNP greater than 1,000.  Chest x-ray with findings suggestive of pulmonary edema and new small bilateral pleural effusions.  She was started on IV diuresing in the ED, not hypoxic.  EDP requested admission for further evaluation and management of her present condition. ? ? ?Assessment & Plan: ?  ?Principal Problem: ?  Acute on chronic diastolic CHF (congestive heart failure) ([place]) ? ?Acute on chronic diastolic CHF ?Presented with volume overload, bilateral lower extremity pitting edema, edema up to abdomen. ?BNP greater than 1,000 ?Chest x-ray with findings of pulmonary edema and small bilateral pleural effusions. ?Started IV diuresing in the ED ?Last 2D echo done on 11/21/2013 showed LVEF 55 to 65% with grade 1 diastolic dysfunction ?Plan ?Obtain 2D echo ?IV diuresis ?Start strict I's and O's  ?daily weight ?Low Na diet/fluid restrict ?Cardiology consult, [place] [facility] ?  ?Bilateral pleural effusions, suspect cardiogenic ?Not hypoxic with O2 saturation ranging between 90% with activity and 96% at rest, in the  ED. ?Continue diuresing, incentive spirometer, mobilize as tolerated. ?Follow 2D echo ?  ?AKI on CKD 3B ?Baseline creatinine appears to be 1.3 with GFR of 39 ?Presented with creatinine of 1.87 with GFR 27 ?Avoid nephrotoxic agents and hypotension ?Closely monitor renal function while on IV diuretics ?Closely monitor urine output with strict I's and Os ?Repeat chemistry panel in the morning ? ?History of upper GI bleed ?Acute on chronic blood loss anemia ?Recently diagnosed erosive gastritis (EGD 07/10/21) on twice daily PPI ?Patient had extensive endoscopic work-up recently including a video capsule endoscopy ?No active bleeding noted ?Plan: ?Per cardiology may discontinue Eliquis at this time ?Continue twice daily PPI ?At this point unlikely that endoscopic evaluation would be indicated ?Patient is on therapeutic anticoagulation so unlikely that she would be safe for scope at this time ?We will continue to monitor hemoglobin closely ?Reach out to GI if endoscopic work-up would be indicated ? ?  ?Paroxysmal A-fib on Eliquis ?Resume home amiodarone ?Eliquis on hold ?Monitor on telemetry ?  ?Sinus bradycardia ?Currently heart rate in the 50s ?Continue to hold off home propranolol ?Last TSH, normal, 2.21 on 07/07/2021. ?Monitor on telemetry. ? ?  ?Anemia of chronic disease ?Hemoglobin 9.1K  ?No overt bleeding ?Continue to monitor H&H ?  ?GERD ?Resume home PPI twice daily ?She follows with GI outpatient. ?  ?Severe morbid obesity ?BMI 40 ?Recommend weight loss outpatient with regular physical activity and healthy dieting. ?  ?Physical debility ?PT OT to assess ?Fall precautions. ? ? ?DVT prophylaxis: SCD ?Code Status: Full ?Family Communication: None today.  Offered to call but patient declined ?Disposition Plan: Status is: Inpatient ?Remains inpatient appropriate because:  Acute decompensated diastolic congestive heart failure.  Acute on chronic anemia ? ? ?Level of care: Telemetry Cardiac ? ?Consultants:   ?Cardiology-[facility] ? ?Procedures:  ?None ? ?Antimicrobials: ?None ? ? ?Subjective: ?Seen and examined.  Resting in bed.  Continues to endorse shortness of breath.  No pain complaints ? ?Objective: ?Vitals:  ? 08/09/21 0530 08/09/21 0630 08/09/21 0900 08/09/21 1130  ?BP: (!) 117/52 (!) 110/44 (!) 112/48 117/81  ?Pulse: (!) 53 (!) 52 (!) 55 (!) 52  ?Resp: 14 16 (!) 21 (!) 22  ?Temp:      ?TempSrc:      ?SpO2: 100% 98% 100% 95%  ?Weight:      ?Height:      ? ? ?Intake/Output Summary (Last 24 hours) at 08/09/2021 1331 ?Last data filed at 08/09/2021 0509 ?Gross per 24 hour  ?Intake --  ?Output 875 ml  ?Net -875 ml  ? ?Filed Weights  ? 08/08/21 1741  ?Weight: 93 kg  ? ? ?Examination: ? ?General exam: Appears calm and comfortable  ?Respiratory system: Bilateral crackles.  Normal work of breathing.  2 L ?Cardiovascular system: S1-S2, tachycardic, regular rhythm, no murmurs, marked pitting edema BLE ?Gastrointestinal system: Obese, NT/ND, normal bowel sounds ?Central nervous system: Alert and oriented. No focal neurological deficits. ?Extremities: Symmetric 5 x 5 power. ?Skin: No rashes, lesions or ulcers ?Psychiatry: Judgement and insight appear normal. Mood & affect appropriate.  ? ? ? ?Data Reviewed: I have personally reviewed following labs and imaging studies ? ?CBC: ?Recent Labs  ?Lab 08/08/21 ?1745 08/09/21 ?0430  ?WBC 7.5 7.0  ?NEUTROABS  --  3.2  ?HGB 9.1* 8.2*  ?HCT 29.1* 26.3*  ?MCV 94.2 93.3  ?PLT 293 246  ? ?Basic Metabolic Panel: ?Recent Labs  ?Lab 08/08/21 ?1745 08/09/21 ?0430  ?NA 136 138  ?K 3.9 3.6  ?CL 104 105  ?CO2 25 24  ?GLUCOSE 124* 110*  ?BUN 29* 29*  ?CREATININE 1.87* 1.79*  ?CALCIUM 8.2* 8.2*  ?MG  --  2.2  ?PHOS  --  4.0  ? ?GFR: ?Estimated Creatinine Clearance: 24.7 mL/min (A) (by C-G formula based on SCr of 1.79 mg/dL (H)). ?Liver Function Tests: ?No results for input(s): AST, ALT, ALKPHOS, BILITOT, PROT, ALBUMIN in the last 168 hours. ?No results for input(s): LIPASE, AMYLASE in the last 168  hours. ?No results for input(s): AMMONIA in the last 168 hours. ?Coagulation Profile: ?No results for input(s): INR, PROTIME in the last 168 hours. ?Cardiac Enzymes: ?No results for input(s): CKTOTAL, CKMB, CKMBINDEX, TROPONINI in the last 168 hours. ?BNP (last 3 results) ?No results for input(s): PROBNP in the last 8760 hours. ?HbA1C: ?No results for input(s): HGBA1C in the last 72 hours. ?CBG: ?No results for input(s): GLUCAP in the last 168 hours. ?Lipid Profile: ?No results for input(s): CHOL, HDL, LDLCALC, TRIG, CHOLHDL, LDLDIRECT in the last 72 hours. ?Thyroid Function Tests: ?No results for input(s): TSH, T4TOTAL, FREET4, T3FREE, THYROIDAB in the last 72 hours. ?Anemia Panel: ?No results for input(s): VITAMINB12, FOLATE, FERRITIN, TIBC, IRON, RETICCTPCT in the last 72 hours. ?Sepsis Labs: ?No results for input(s): PROCALCITON, LATICACIDVEN in the last 168 hours. ? ?No results found for this or any previous visit (from the past 240 hour(s)).  ? ? ? ? ? ?Radiology Studies: ?DG Chest 2 View ? ?Result Date: 08/08/2021 ?CLINICAL DATA:  Shortness of breath. EXAM: CHEST - 2 VIEW COMPARISON:  07/06/2021 FINDINGS: Stable heart size. Stable appearance large hiatal hernia. Lungs demonstrate low lung volumes with new small bilateral pleural effusions  and suggestion of mild pulmonary interstitial edema. No pneumothorax or focal airspace consolidation. IMPRESSION: 1. Suggestion of mild pulmonary interstitial edema with new small bilateral pleural effusions. 2. Stable large hiatal hernia. Electronically Signed   By: Aletta Edouard M.D.   On: 08/08/2021 18:44   ? ? ? ? ? ?Scheduled Meds: ? amiodarone  200 mg Oral Daily  ? cholecalciferol  1,000 Units Oral Daily  ? furosemide  60 mg Intravenous Daily  ? mometasone-formoterol  2 puff Inhalation BID  ? pantoprazole  40 mg Oral BID  ? polyvinyl alcohol  1 drop Both Eyes Daily  ? rosuvastatin  40 mg Oral Daily  ? ?Continuous Infusions: ? ? LOS: 1 day  ? ? ?Sidney Ace,  MD ?[facility] ? ? ?If 7PM-7AM, please contact night-coverage ? ?08/09/2021, 1:31 PM  ? ?

## 2021-08-09 NOTE — Evaluation (Signed)
Physical Therapy Evaluation ?Patient Details ?Name: April [name] ?MRN: [id number] ?DOB: 1941/01/20 ?Today's Date: 08/09/2021 ? ?History of Present Illness ? 81 y.o. female with medical history significant for paroxysmal A-fib on Eliquis, HFpEF, coronary artery disease status post PCI of RCA in 2017, essential hypertension, OSA on CPAP, chronic lower extremity edema, CKD 3B, hyperlipidemia, GERD, type 2 diabetes, recently diagnosed erosive gastritis (EGD 07/10/21) on twice daily PPI, who presented to [facility] ED with complaints of gradually worsening shortness of breath of 1 month duration.  Associated with 15 pounds weight gain, and abdominal distention within 2 weeks.  Patient initially called her PCP who recommended that she comes to the ED for further evaluation.  ?Clinical Impression ? Pt did well with PT exam and was able to tolerate moderate bout of ambulation with relative ease.  She did not have any overt safety issues and overall is not too far from her baseline.  She used SPC during our ambulation effort, she was not overly reliant on it but clearly did use it.  She reported some mild fatigue/SOB with the effort, but sats remained in the 90s on room air and ultimately she did well.  Recommending HHPT as she is not at her baseline and has some decreased activity tolerance but ultimately she did well and should be able to return to her baseline ALF apartment with medically ready for d/c.    ? ?Recommendations for follow up therapy are one component of a multi-disciplinary discharge planning process, led by the attending physician.  Recommendations may be updated based on patient status, additional functional criteria and insurance authorization. ? ?Follow Up Recommendations Home health PT ? ?  ?Assistance Recommended at Discharge Set up Supervision/Assistance  ?Patient can return home with the following ? Assistance with cooking/housework;Assist for transportation ? ?  ?Equipment Recommendations  (discussed  rollator, not necessary yet)  ?Recommendations for Other Services ?    ?  ?Functional Status Assessment Patient has had a recent decline in their functional status and demonstrates the ability to make significant improvements in function in a reasonable and predictable amount of time.  ? ?  ?Precautions / Restrictions Precautions ?Precautions: None ?Precaution Comments: low fall risk ?Restrictions ?Weight Bearing Restrictions: No  ? ?  ? ?Mobility ? Bed Mobility ?  ?  ?  ?  ?  ?  ?  ?General bed mobility comments: in recliner pre/post session ?  ? ?Transfers ?Overall transfer level: Modified independent ?Equipment used: Rolling walker (2 wheels) ?Transfers: Sit to/from Stand, Bed to chair/wheelchair/BSC ?Sit to Stand: Supervision ?  ?Step pivot transfers: Supervision ?  ?  ?  ?General transfer comment: Pt was able to rise to standing with relative ease, minimal reliance on UEs/cane for stability to stand ?  ? ?Ambulation/Gait ?Ambulation/Gait assistance: Supervision ?Gait Distance (Feet): 150 Feet ?Assistive device: Straight cane ?  ?  ?  ?  ?General Gait Details: Pt was able to ambulate into the hallway with relative ease and though she had some fatigue  her O2 remained in the 90s while on room air during the effort.  No LOBs, buckling or other overt safety issues ? ?Stairs ?  ?  ?  ?  ?  ? ?Wheelchair Mobility ?  ? ?Modified Rankin (Stroke Patients Only) ?  ? ?  ? ?Balance Overall balance assessment: Needs assistance ?Sitting-balance support: Feet supported, Bilateral upper extremity supported ?Sitting balance-Leahy Scale: Good ?  ?  ?Standing balance support: Reliant on assistive device for balance, Single extremity  supported, During functional activity ?Standing balance-Leahy Scale: Good ?  ?  ?  ?  ?  ?  ?  ?  ?  ?  ?  ?  ?   ? ? ? ?Pertinent Vitals/Pain Pain Assessment ?Pain Assessment: No/denies pain  ? ? ?Home Living Family/patient expects to be discharged to:: Private residence ?Living Arrangements:  Alone ?Available Help at Discharge: Family;Available PRN/intermittently ?Type of Home: Assisted living ?Home Access: Stairs to enter;Elevator ?  ?  ?  ?Home Layout: One level ?  ?Additional Comments: Apartment at [facility]  ?  ?Prior Function Prior Level of Function : Independent/Modified Independent ?  ?  ?  ?  ?  ?  ?Mobility Comments: Pt drives; ambulates to dinning room with SPC. Pt does endorse increasing furniture cruising at home. ?ADLs Comments: Pt Mod I for self care tasks. ?  ? ? ?Hand Dominance  ? Dominant Hand: Right ? ?  ?Extremity/Trunk Assessment  ? Upper Extremity Assessment ?Upper Extremity Assessment: Generalized weakness;Overall [facility] for tasks assessed ?  ? ?Lower Extremity Assessment ?Lower Extremity Assessment: Generalized weakness;Overall [facility] for tasks assessed ?  ? ?Cervical / Trunk Assessment ?Cervical / Trunk Assessment: Normal  ?Communication  ? Communication: No difficulties  ?Cognition Arousal/Alertness: Awake/alert ?Behavior During Therapy: [facility] for tasks assessed/performed ?Overall Cognitive Status: Within Functional Limits for tasks assessed ?  ?  ?  ?  ?  ?  ?  ?  ?  ?  ?  ?  ?  ?  ?  ?  ?  ?  ?  ? ?  ?General Comments General comments (skin integrity, edema, etc.): Pt with some tremor, she reports it as more pronounced than her baseline tremor ? ?  ?Exercises    ? ?Assessment/Plan  ?  ?PT Assessment Patient needs continued PT services  ?PT Problem List Decreased strength;Decreased activity tolerance;Decreased safety awareness;Decreased knowledge of use of DME ? ?   ?  ?PT Treatment Interventions DME instruction;Gait training;Functional mobility training;Therapeutic activities;Therapeutic exercise;Balance training;Neuromuscular re-education;Patient/family education   ? ?PT Goals (Current goals can be found in the Care Plan section)  ?Acute Rehab PT Goals ?Patient Stated Goal: return to [facility] ?PT Goal Formulation: With patient ?Time For Goal Achievement:  08/23/21 ?Potential to Achieve Goals: Good ? ?  ?Frequency Min 2X/week ?  ? ? ?Co-evaluation   ?  ?  ?  ?  ? ? ?  ?AM-PAC PT "6 Clicks" Mobility  ?Outcome Measure Help needed turning from your back to your side while in a flat bed without using bedrails?: None ?Help needed moving from lying on your back to sitting on the side of a flat bed without using bedrails?: None ?Help needed moving to and from a bed to a chair (including a wheelchair)?: None ?Help needed standing up from a chair using your arms (e.g., wheelchair or bedside chair)?: None ?Help needed to walk in hospital room?: None ?Help needed climbing 3-5 steps with a railing? : A Little ?6 Click Score: 23 ? ?  ?End of Session Equipment Utilized During Treatment: Gait belt ?Activity Tolerance: Patient tolerated treatment well ?Patient left: with call bell/phone within reach ?Nurse Communication: Mobility status ?PT Visit Diagnosis: Muscle weakness (generalized) (M62.81);Difficulty in walking, not elsewhere classified (R26.2) ?  ? ?Time: 8757-9728 ?PT Time Calculation (min) (ACUTE ONLY): 18 min ? ? ?Charges:   PT Evaluation ?$PT Eval Low Complexity: 1 Low ?PT Treatments ?$Gait Training: 8-22 mins ?  ?   ? ? ?Mosi Hannold R  Montine Circle, DPT ?08/09/2021, 3:08 PM ? ?

## 2021-08-09 NOTE — Evaluation (Signed)
Occupational Therapy Evaluation ?Patient Details ?Name: April [name] ?MRN: [id number] ?DOB: 17-May-1940 ?Today's Date: 08/09/2021 ? ? ?History of Present Illness 81 y.o. female with medical history significant for paroxysmal A-fib on Eliquis, HFpEF, coronary artery disease status post PCI of RCA in 2017, essential hypertension, OSA on CPAP, chronic lower extremity edema, CKD 3B, hyperlipidemia, GERD, type 2 diabetes, recently diagnosed erosive gastritis (EGD 07/10/21) on twice daily PPI, who presented to [facility] ED with complaints of gradually worsening shortness of breath of 1 month duration.  Associated with 15 pounds weight gain, and abdominal distention within 2 weeks.  Patient initially called her PCP who recommended that she comes to the ED for further evaluation.  ? ?Clinical Impression ?  ?Patient presenting with decreased Ind in self care,balance, functional mobility/transfers, endurance, and safety awareness. Patient reports living in an independent living facility in her own cottage. She ambulates with SPC , drives, and performs her own ADLs. Someone comes to clean her cottage and she can go to dinning room for meals. Patient currently functioning at min guard progressing to supervision for LB self care and ambulation. Pt remains at 95% O2 saturation or better while on RA. Pt does fatigue quickly. Patient will benefit from acute OT to increase overall independence in the areas of ADLs, functional mobility, and safety awareness in order to safely discharge home.  ?   ? ?Recommendations for follow up therapy are one component of a multi-disciplinary discharge planning process, led by the attending physician.  Recommendations may be updated based on patient status, additional functional criteria and insurance authorization.  ? ?Follow Up Recommendations ? Home health OT  ?  ?Assistance Recommended at Discharge Intermittent Supervision/Assistance  ?Patient can return home with the following A little help with  walking and/or transfers;A little help with bathing/dressing/bathroom;Assist for transportation ? ?  ?Functional Status Assessment ? Patient has had a recent decline in their functional status and demonstrates the ability to make significant improvements in function in a reasonable and predictable amount of time.  ?Equipment Recommendations ? None recommended by OT  ?  ?   ?Precautions / Restrictions Precautions ?Precautions: None ?Precaution Comments: low fall risk  ? ?  ? ?Mobility Bed Mobility ?  ?Bed Mobility: Supine to Sit ?  ?  ?Supine to sit: Supervision ?  ?  ?General bed mobility comments: close supervision from ED stretcher ?  ? ?Transfers ?Overall transfer level: Needs assistance ?Equipment used: 1 person hand held assist, Straight cane ?Transfers: Sit to/from Stand, Bed to chair/wheelchair/BSC ?Sit to Stand: Supervision ?  ?  ?Step pivot transfers: Supervision ?  ?  ?  ?  ? ?  ?Balance Overall balance assessment: Needs assistance ?Sitting-balance support: Feet supported, Bilateral upper extremity supported ?Sitting balance-Leahy Scale: Good ?  ?  ?Standing balance support: Reliant on assistive device for balance, Single extremity supported, During functional activity ?Standing balance-Leahy Scale: Fair ?  ?  ?  ?  ?  ?  ?  ?  ?  ?  ?  ?  ?   ? ?ADL either performed or assessed with clinical judgement  ? ?ADL Overall ADL's : Needs assistance/impaired ?  ?  ?  ?  ?  ?  ?  ?  ?  ?  ?Lower Body Dressing: Min guard;Sit to/from stand ?Lower Body Dressing Details (indicate cue type and reason): to don pants ?Toilet Transfer: Min guard ?Toilet Transfer Details (indicate cue type and reason): with SPC ?  ?  ?  ?  ?  Functional mobility during ADLs: Supervision/safety;Min guard;Cane ?   ? ? ? ?Vision Patient Visual Report: No change from baseline ?   ?   ?   ?   ? ?Pertinent Vitals/Pain Pain Assessment ?Pain Assessment: No/denies pain  ? ? ? ?Hand Dominance Right ?  ?Extremity/Trunk Assessment Upper Extremity  Assessment ?Upper Extremity Assessment: Overall WFL for tasks assessed ?  ?Lower Extremity Assessment ?Lower Extremity Assessment: Overall WFL for tasks assessed ?  ?Cervical / Trunk Assessment ?Cervical / Trunk Assessment: Normal ?  ?Communication Communication ?Communication: No difficulties ?  ?Cognition Arousal/Alertness: Awake/alert ?Behavior During Therapy: [facility] for tasks assessed/performed ?Overall Cognitive Status: Within Functional Limits for tasks assessed ?  ?  ?  ?  ?  ?  ?  ?  ?  ?  ?  ?  ?  ?  ?  ?  ?  ?  ?  ?   ?   ?   ? ? ?Home Living   ?  ?  ?  ?  ?  ?  ?  ?  ?  ?  ?  ?  ?  ?  ?  ?  ?Additional Comments: Pt lives in an cottage at independent living facility ?  ? ?  ?Prior Functioning/Environment Prior Level of Function : Independent/Modified Independent ?  ?  ?  ?  ?  ?  ?Mobility Comments: Pt drives and ambulates to dinning room with SPC. Pt does endorse furniture cruising at home. ?ADLs Comments: Pt Mod I for self care tasks. ?  ? ?  ?  ?OT Problem List: Decreased strength;Decreased activity tolerance;Impaired balance (sitting and/or standing);Decreased safety awareness;Decreased knowledge of use of DME or AE;Decreased knowledge of precautions ?  ?   ?OT Treatment/Interventions: Self-care/ADL training;Therapeutic exercise;Therapeutic activities;DME and/or AE instruction;Patient/family education  ?  ?OT Goals(Current goals can be found in the care plan section) Acute Rehab OT Goals ?Patient Stated Goal: to get better ?OT Goal Formulation: With patient/family ?Time For Goal Achievement: 08/23/21 ?Potential to Achieve Goals: Good ?ADL Goals ?Pt Will Perform Grooming: with modified independence;standing ?Pt Will Perform Lower Body Dressing: with modified independence;sit to/from stand ?Pt Will Transfer to Toilet: with modified independence;ambulating ?Pt Will Perform Toileting - Clothing Manipulation and hygiene: with modified independence;sit to/from stand  ?OT Frequency: Min 2X/week ?  ? ?   ?AM-PAC  OT "6 Clicks" Daily Activity     ?Outcome Measure Help from another person eating meals?: None ?Help from another person taking care of personal grooming?: None ?Help from another person toileting, which includes using toliet, bedpan, or urinal?: A Little ?Help from another person bathing (including washing, rinsing, drying)?: A Little ?Help from another person to put on and taking off regular upper body clothing?: None ?Help from another person to put on and taking off regular lower body clothing?: None ?6 Click Score: 22 ?  ?End of Session Equipment Utilized During Treatment: Rolling walker (2 wheels) ?Nurse Communication: Mobility status ? ?Activity Tolerance: Patient tolerated treatment well ?Patient left: in chair;with call bell/phone within reach;with family/visitor present ? ?OT Visit Diagnosis: Unsteadiness on feet (R26.81);Repeated falls (R29.6);Muscle weakness (generalized) (M62.81)  ?              ?Time: 9470-9628 ?OT Time Calculation (min): 20 min ?Charges:  OT General Charges ?$OT Visit: 1 Visit ?OT Evaluation ?$OT Eval Low Complexity: 1 Low ?OT Treatments ?$Self Care/Home Management : 8-22 mins ? ?Darleen Crocker, MS, OTR/L , CBIS ?ascom [phone]  ?08/09/21, 1:30 PM ? ?

## 2021-08-09 NOTE — ED Notes (Addendum)
Urine output 264m ?

## 2021-08-10 ENCOUNTER — Ambulatory Visit: Payer: Medicare Other | Admitting: Internal Medicine

## 2021-08-10 ENCOUNTER — Inpatient Hospital Stay (HOSPITAL_COMMUNITY)
Admit: 2021-08-10 | Discharge: 2021-08-10 | Disposition: A | Discharge status: Home / Self Care | Payer: Medicare Other | Attending: Physician Assistant | Admitting: Physician Assistant

## 2021-08-10 DIAGNOSIS — I10 Essential (primary) hypertension: Secondary | ICD-10-CM

## 2021-08-10 DIAGNOSIS — I480 Paroxysmal atrial fibrillation: Secondary | ICD-10-CM

## 2021-08-10 DIAGNOSIS — K922 Gastrointestinal hemorrhage, unspecified: Secondary | ICD-10-CM | POA: Diagnosis not present

## 2021-08-10 DIAGNOSIS — I5031 Acute diastolic (congestive) heart failure: Secondary | ICD-10-CM | POA: Diagnosis not present

## 2021-08-10 DIAGNOSIS — I5033 Acute on chronic diastolic (congestive) heart failure: Secondary | ICD-10-CM | POA: Diagnosis not present

## 2021-08-10 LAB — BASIC METABOLIC PANEL
Anion gap: 7 (ref 5–15)
BUN: 30 mg/dL — ABNORMAL HIGH (ref 8–23)
CO2: 26 mmol/L (ref 22–32)
Calcium: 8.5 mg/dL — ABNORMAL LOW (ref 8.9–10.3)
Chloride: 105 mmol/L (ref 98–111)
Creatinine, Ser: 1.93 mg/dL — ABNORMAL HIGH (ref 0.44–1.00)
GFR, Estimated: 26 mL/min — ABNORMAL LOW (ref >60)
Glucose, Bld: 94 mg/dL (ref 70–99)
Potassium: 4.2 mmol/L (ref 3.5–5.1)
Sodium: 138 mmol/L (ref 135–145)

## 2021-08-10 LAB — ECHOCARDIOGRAM COMPLETE
AR max vel: 2.76 cm2
AV Area VTI: 1.91 cm2
AV Area mean vel: 1.86 cm2
AV Mean grad: 7 mmHg
AV Peak grad: 12.6 mmHg
Ao pk vel: 1.78 m/s
Area-P 1/2: 4.1 cm2
Height: 60 in
MV VTI: 2 cm2
S' Lateral: 2.5 cm
Weight: 3280 oz

## 2021-08-10 MED ORDER — FUROSEMIDE 10 MG/ML IJ SOLN
60.0000 mg | Freq: Two times a day (BID) | INTRAMUSCULAR | Status: DC
  Administered 2021-08-10 – 2021-08-13 (×6): 60 mg via INTRAVENOUS
  Filled 2021-08-10 (×6): qty 6

## 2021-08-10 MED ORDER — METOLAZONE 5 MG PO TABS
5.0000 mg | ORAL_TABLET | Freq: Once | ORAL | Status: AC
  Administered 2021-08-10: 5 mg via ORAL
  Filled 2021-08-10: qty 1

## 2021-08-10 MED ORDER — FUROSEMIDE 10 MG/ML IJ SOLN
40.0000 mg | Freq: Two times a day (BID) | INTRAMUSCULAR | Status: DC
  Administered 2021-08-10: 40 mg via INTRAVENOUS
  Filled 2021-08-10: qty 4

## 2021-08-10 NOTE — Progress Notes (Signed)
Occupational Therapy Treatment ?Patient Details ?Name: April [name] ?MRN: [id number] ?DOB: March 01, 1941 ?Today's Date: 08/10/2021 ? ? ?History of present illness 81 y.o. female with medical history significant for paroxysmal A-fib on Eliquis, HFpEF, coronary artery disease status post PCI of RCA in 2017, essential hypertension, OSA on CPAP, chronic lower extremity edema, CKD 3B, hyperlipidemia, GERD, type 2 diabetes, recently diagnosed erosive gastritis (EGD 07/10/21) on twice daily PPI, who presented to [facility] ED with complaints of gradually worsening shortness of breath of 1 month duration.  Associated with 15 pounds weight gain, and abdominal distention within 2 weeks.  Patient initially called her PCP who recommended that she comes to the ED for further evaluation. ?  ?OT comments ? Ms. Tessmer endorses moderate pain 2/2 significant LE swelling but is nevertheless able to engage in fxl mobility without any physical assistance, requiring only SUPV for safety. She uses a SPC for cane for stability and also occasionally reaches out to use the wall for support. She performs bed mobility, transfers, toileting, hygiene, grooming, ambulation, UE and LE therex without LOB but does display SOB, particularly with ambulation and with conversation, and requires several rest breaks. Will continue to provide OT services while pt is hospitalized, with HHOT recommended post-DC.  ? ?Recommendations for follow up therapy are one component of a multi-disciplinary discharge planning process, led by the attending physician.  Recommendations may be updated based on patient status, additional functional criteria and insurance authorization. ?   ?Follow Up Recommendations ? Home health OT  ?  ?Assistance Recommended at Discharge Intermittent Supervision/Assistance  ?Patient can return home with the following ? A little help with walking and/or transfers;A little help with bathing/dressing/bathroom;Assist for transportation ?  ?Equipment  Recommendations ? None recommended by OT  ?  ?Recommendations for Other Services   ? ?  ?Precautions / Restrictions Precautions ?Precautions: None ?Precaution Comments: low fall risk ?Restrictions ?Weight Bearing Restrictions: No  ? ? ?  ? ?Mobility Bed Mobility ?Overal bed mobility: Needs Assistance ?Bed Mobility: Supine to Sit ?  ?  ?Supine to sit: Supervision ?  ?  ?General bed mobility comments: SPC ?  ? ?Transfers ?Overall transfer level: Needs assistance ?Equipment used: Straight cane ?Transfers: Sit to/from Stand, Bed to chair/wheelchair/BSC ?Sit to Stand: Supervision ?  ?  ?Step pivot transfers: Supervision ?  ?  ?General transfer comment: Able to rise from bed fairly easily; had greater difficulty coming into standing from toilet. Uses SPC for stability. ?  ?  ?Balance Overall balance assessment: Needs assistance ?Sitting-balance support: Feet supported, Bilateral upper extremity supported ?Sitting balance-Leahy Scale: Good ?  ?  ?Standing balance support: Reliant on assistive device for balance, Single extremity supported, During functional activity ?Standing balance-Leahy Scale: Good ?  ?  ?  ?  ?  ?  ?  ?  ?  ?  ?  ?  ?   ? ?ADL either performed or assessed with clinical judgement  ? ?ADL Overall ADL's : Needs assistance/impaired ?  ?  ?Grooming: Supervision/safety;Standing;Wash/dry hands;Wash/dry face;Oral care;Brushing hair ?  ?  ?  ?  ?  ?  ?  ?  ?  ?Toilet Transfer: Supervision/safety;Regular Toilet ?Toilet Transfer Details (indicate cue type and reason): with SPC ?Toileting- Clothing Manipulation and Hygiene: Supervision/safety;Sit to/from stand ?  ?  ?  ?Functional mobility during ADLs: Supervision/safety;Cane ?  ?  ? ?Extremity/Trunk Assessment Upper Extremity Assessment ?Upper Extremity Assessment: Overall WFL for tasks assessed ?  ?Lower Extremity Assessment ?Lower Extremity Assessment: Overall [facility]  for tasks assessed ?  ?Cervical / Trunk Assessment ?Cervical / Trunk Assessment: Normal ?   ? ?Vision Patient Visual Report: No change from baseline ?  ?  ?Perception   ?  ?Praxis   ?  ? ?Cognition Arousal/Alertness: Awake/alert ?Behavior During Therapy: [place] for tasks assessed/performed ?Overall Cognitive Status: Within Functional Limits for tasks assessed ?  ?  ?  ?  ?  ?  ?  ?  ?  ?  ?  ?  ?  ?  ?  ?  ?General Comments: pleasant and eager to participate in session ?  ?  ?   ?Exercises Other Exercises ?Other Exercises: UE and LE therex is sitting and standing ? ?  ?Shoulder Instructions   ? ? ?  ?General Comments    ? ? ?Pertinent Vitals/ Pain       Pain Assessment ?Pain Assessment: 0-10 ?Pain Score: 4  ?Pain Location: LE, 2/2 edema ?Pain Intervention(s): Repositioned ? ?Home Living   ?  ?  ?  ?  ?  ?  ?  ?  ?  ?  ?  ?  ?  ?  ?  ?  ?  ?  ? ?  ?Prior Functioning/Environment    ?  ?  ?  ?   ? ?Frequency ? Min 2X/week  ? ? ? ? ?  ?Progress Toward Goals ? ?OT Goals(current goals can now be found in the care plan section) ? Progress towards OT goals: Progressing toward goals ? ?Acute Rehab OT Goals ?Patient Stated Goal: to feel better ?OT Goal Formulation: With patient/family ?Time For Goal Achievement: 08/23/21 ?Potential to Achieve Goals: Good  ?Plan Discharge plan remains appropriate   ? ?Co-evaluation ? ? ?   ?  ?  ?  ?  ? ?  ?AM-PAC OT "6 Clicks" Daily Activity     ?Outcome Measure ? ? Help from another person eating meals?: None ?Help from another person taking care of personal grooming?: None ?Help from another person toileting, which includes using toliet, bedpan, or urinal?: A Little ?Help from another person bathing (including washing, rinsing, drying)?: A Little ?Help from another person to put on and taking off regular upper body clothing?: None ?Help from another person to put on and taking off regular lower body clothing?: A Little ?6 Click Score: 21 ? ?  ?End of Session Equipment Utilized During Treatment: Other (comment) (cane) ? ?OT Visit Diagnosis: Unsteadiness on feet (R26.81);Repeated  falls (R29.6);Muscle weakness (generalized) (M62.81) ?  ?Activity Tolerance Patient tolerated treatment well ?  ?Patient Left in chair;with call bell/phone within reach;with family/visitor present ?  ?Nurse Communication   ?  ? ?   ? ?Time: 6384-5364 ?OT Time Calculation (min): 14 min ? ?Charges: OT General Charges ?$OT Visit: 1 Visit ?OT Treatments ?$Self Care/Home Management : 8-22 mins ? ?Josiah Lobo, PhD, MS, OTR/L ?08/10/21, 3:49 PM ? ?

## 2021-08-10 NOTE — Assessment & Plan Note (Signed)
Rate controlled with amiodarone.  Heart rate in 50s to 60s and may have to dose of amiodarone.  Off Eliquis due to recent GI bleed.  Patient prefers not to resume Eliquis at this point ?

## 2021-08-10 NOTE — Consult Note (Signed)
[facility] CM Inpatient Consult ? ? ?08/10/2021 ? ?April [name] ?05/10/1940 ?[id number] ? ?[facility] CM) ?  ?Patient was reviewed for less than 30 days unplanned readmission. Assessed for post hospital chronic disease management and care coordination needs with [facility] CM.  ? ?Per review, patient's primary provider office offers embedded chronic case management services. ?  ?Plan: Continue to follow for progression and disposition. ? ?Of note, [facility] Care Management services does not replace or interfere with any services that are arranged by inpatient case management or social work.  ? ?Netta Cedars, MSN, RN ?[facility] Liaison ?Phone [phone] ?Toll free office [phone]  ? ?

## 2021-08-10 NOTE — Assessment & Plan Note (Signed)
Morbid obesity with BMI of 40 and hypertension.  Complicates overall prognosis ?

## 2021-08-10 NOTE — Assessment & Plan Note (Signed)
Continue amiodarone and Lasix.  Blood pressure stable ?

## 2021-08-10 NOTE — Progress Notes (Signed)
? ?Cardiology Progress Note  ? ?Patient Name: April [name] ?Date of Encounter: 08/10/2021 ? ?Primary Cardiologist: Ida Rogue, MD ? ?Subjective  ? ?No chest pain or dyspnea, but doesn't feel like lower ext edema has changed much.  Only 445 ml out yesterday. ? ?Inpatient Medications  ?  ?Scheduled Meds: ? amiodarone  200 mg Oral Daily  ? cholecalciferol  1,000 Units Oral Daily  ? furosemide  60 mg Intravenous Daily  ? mometasone-formoterol  2 puff Inhalation BID  ? pantoprazole  40 mg Oral BID  ? polyvinyl alcohol  1 drop Both Eyes Daily  ? rosuvastatin  40 mg Oral Daily  ? ?Continuous Infusions: ? ?PRN Meds: ?acetaminophen, albuterol  ? ?Vital Signs  ?  ?Vitals:  ? 08/09/21 1941 08/09/21 2317 08/10/21 0331 08/10/21 0533  ?BP: (!) 105/46 (!) 116/46 (!) 113/52   ?Pulse: (!) 49 (!) 55 (!) 57   ?Resp: '18 16 16   '$ ?Temp: 97.7 ?F (36.5 ?C) 97.8 ?F (36.6 ?C) 99.2 ?F (37.3 ?C)   ?TempSrc:  Axillary Axillary   ?SpO2: 98% 98% 99%   ?Weight:    93 kg  ?Height:      ? ? ?Intake/Output Summary (Last 24 hours) at 08/10/2021 0743 ?Last data filed at 08/10/2021 0541 ?Gross per 24 hour  ?Intake 480 ml  ?Output 925 ml  ?Net -445 ml  ? ?Filed Weights  ? 08/08/21 1741 08/10/21 0533  ?Weight: 93 kg 93 kg  ? ? ?Physical Exam  ? ?GEN: obese, in no acute distress.  ?HEENT: Grossly normal.  ?Neck: Supple, no JVD, carotid bruits, or masses. ?Cardiac: RRR, no murmurs, rubs, or gallops. No clubbing, cyanosis.  3+ bilat LE edema to mid-thigh bilat.  Radials 2+, DP/PT 1+ and equal bilaterally.  ?Respiratory:  Respirations regular and unlabored, clear to auscultation bilaterally. ?GI: Obese, soft, nontender, nondistended, BS + x 4. ?MS: no deformity or atrophy. ?Skin: warm and dry, no rash. ?Neuro:  Strength and sensation are intact. ?Psych: AAOx3.  Normal affect. ? ?Labs  ?  ?Chemistry ?Recent Labs  ?Lab 08/08/21 ?1745 08/09/21 ?0430 08/10/21 ?6789  ?NA 136 138 138  ?K 3.9 3.6 4.2  ?CL 104 105 105  ?CO2 '25 24 26  '$ ?GLUCOSE 124* 110* 94  ?BUN  29* 29* 30*  ?CREATININE 1.87* 1.79* 1.93*  ?CALCIUM 8.2* 8.2* 8.5*  ?PROT  --  5.5*  --   ?ALBUMIN  --  2.4*  --   ?AST  --  47*  --   ?ALT  --  25  --   ?ALKPHOS  --  75  --   ?BILITOT  --  0.7  --   ?GFRNONAA 27* 28* 26*  ?ANIONGAP '7 9 7  '$ ?  ? ?Hematology ?Recent Labs  ?Lab 08/08/21 ?1745 08/09/21 ?0430  ?WBC 7.5 7.0  ?RBC 3.09* 2.82*  ?HGB 9.1* 8.2*  ?HCT 29.1* 26.3*  ?MCV 94.2 93.3  ?MCH 29.4 29.1  ?MCHC 31.3 31.2  ?RDW 15.1 14.9  ?PLT 293 246  ? ? ?Cardiac Enzymes  ?Recent Labs  ?Lab 08/08/21 ?1745 08/08/21 ?1934  ?TROPONINIHS 15 16  ?   ? ?BNP ?   ?Component Value Date/Time  ? BNP 1,032.5 (H) 08/08/2021 1745  ? ?Lipids  ?Lab Results  ?Component Value Date  ? CHOL 129 01/20/2021  ? HDL 50.90 01/20/2021  ? [place] 62 01/20/2021  ? TRIG 80.0 01/20/2021  ? CHOLHDL 3 01/20/2021  ? ? ?HbA1c  ?Lab Results  ?Component Value Date  ?  HGBA1C 6.2 01/20/2021  ? ? ?Radiology  ?  ?DG Chest 2 View ? ?Result Date: 08/08/2021 ?CLINICAL DATA:  Shortness of breath. EXAM: CHEST - 2 VIEW COMPARISON:  07/06/2021 FINDINGS: Stable heart size. Stable appearance large hiatal hernia. Lungs demonstrate low lung volumes with new small bilateral pleural effusions and suggestion of mild pulmonary interstitial edema. No pneumothorax or focal airspace consolidation. IMPRESSION: 1. Suggestion of mild pulmonary interstitial edema with new small bilateral pleural effusions. 2. Stable large hiatal hernia. Electronically Signed   By: Aletta Edouard M.D.   On: 08/08/2021 18:44   ? ?Telemetry  ?  ?Sinus brady to sinus rhythm w/ 1st deg avb - 50's to 60's - Personally Reviewed ? ?Cardiac Studies  ? ?2D Echocardiogram 4.12.2023 ? ?Pending ?_____________ ? ?2D Echocardiogram 7.2015 ? ?Study Conclusions  ? ?- Left ventricle: The cavity size was normal. Systolic function was  ?  normal. The estimated ejection fraction was in the range of 55%  ?  to 65%. Wall motion was normal; there were no regional wall  ?  motion abnormalities. Doppler parameters are  consistent with  ?  abnormal left ventricular relaxation (grade 1 diastolic  ?  dysfunction).  ?- Mitral valve: There was mild regurgitation.  ?- Left atrium: The atrium was normal in size.  ?- Right ventricle: Systolic function was normal.  ?- Pulmonary arteries: Systolic pressure was within the high normal  ?  range. PA peak pressure: 32 mm Hg (S).  ?_____________  ? ?Patient Profile  ?   ? 81 y.o. female with a hx of CAD s/p prior RCA and pDA stenting, HFpEF, PAF on Eliquis, anemia with recurrent GI bleeding, CKD stage III, HTN, HLD, and sleep apnea, who was admitted 4/10 w/ ongoing lower ext edema/volume overload, and wt gain. ? ?Assessment & Plan  ?  ?1.  Acute on chronic HFpEF: Patient with significant weight gain and volume overload following 2 separate admissions for GI bleed and acute kidney injury resulting in holding of home diuretics with fluid resuscitation and packed red blood cells.  Previous EF of 55 to 65% by echo in July 2015 with echo pending this morning.  She is on intravenous Lasix and is minus only 445 overnight and -1.3 L since admission.  BUN/Creat up slightly @ 30/1.93.  Wt unchanged @ 93 kg (prev dry wt closer to 85 kg).  She remains markedly volume overloaded.  She previously wore Unna boots at home in the setting of chronic lymphedema and I think ongoing compression will be an important part of her management.  She may require initiation of metolazone if response to Lasix continues to falter. ? ?2.  PAF:  Maintaining sinus rhythm on PO amio.  Rates 50's to 60's.  Follow.  May have to reduce amio to 100 daily for further brady.  In setting of recent GIBs (admission x 2), she is off of [place].  Will need outpt referral for consideration of [place]. ? ?3.  Essential HTN: Blood pressure stable. ? ?4.  GIB/Anemia: Now off of Eliquis following GI bleed with recurrent GI bleeding/melena after resumption of Eliquis.  Patient prefers to remain off of it.  H&H down slightly yesterday at 8.2 and  26.3. ? ?5.  CAD: Status post prior RCA and PDA stenting in the setting of inferolateral STEMI in November 2017.  She has not been experiencing any chest pain.  She remains on statin therapy.  Aspirin previously held in the setting of GI bleeding. ? ?6.  HL: Continue statin.  LDL of 62 in September. ? ?7.  CKD III: BUN and creatinine up slightly since admission at 30 and 1.93.  Follow with diuresis. ? ?Signed, ?Murray Hodgkins, NP  ?08/10/2021, 7:43 AM   ? ?For questions or updates, please contact   ?Please consult [email or web address] for contact info under Cardiology/STEMI.  ?

## 2021-08-10 NOTE — Progress Notes (Signed)
Mobility Specialist - Progress Note ? ? 08/10/21 1250  ?Mobility  ?Activity Transferred from bed to chair  ?Level of Assistance Standby assist, set-up cues, supervision of patient - no hands on  ?Assistive Device [place]  ?Activity Response Tolerated well  ?$Mobility charge 1 Mobility  ? ? ? ?Pt transferred bed-chair with SPC. No complaints. Needs in reach.  ? ? ?Kathee Delton ?Mobility Specialist ?08/10/21, 1:04 PM ? ? ? ? ?

## 2021-08-10 NOTE — Progress Notes (Signed)
*  PRELIMINARY RESULTS* ?Echocardiogram ?2D Echocardiogram has been performed. ? ?April [name], April [name] ?08/10/2021, 9:25 AM ?

## 2021-08-10 NOTE — Progress Notes (Signed)
?  Progress Note ? ? ?Patient: April [name] VZC:[id number] DOB: [birth year] DOA: 08/08/2021     2 ?DOS: the patient was seen and examined on 08/10/2021 ?  ?Brief hospital course: ?81 y.o. female with medical history significant for paroxysmal A-fib on Eliquis, HFpEF, coronary artery disease status post PCI of RCA in 2017, essential hypertension, OSA on CPAP, chronic lower extremity edema, CKD 3B, hyperlipidemia, GERD, type 2 diabetes, recently diagnosed erosive gastritis (EGD 07/10/21) on twice daily PPI, who presented to [facility] ED with complaints of gradually worsening shortness of breath of 1 month duration.  Associated with 15 pounds weight gain, and abdominal distention within 2 weeks.  Admitted for acute on chronic CHF exacerbation ? ?4/12: Continue ongoing diuresis per cardiology ? ? ?Assessment and Plan: ?* Acute on chronic diastolic CHF (congestive heart failure) ([place]) ?EF 55 to 60% on last echo.  She seems markedly volume overloaded and dyspneic at rest. ?Net IO Since Admission: -800 mL [08/10/21 1528]  ?Admitted different diuretic if nonresponsive to Lasix ?Cardiology following ? ?GI bleeding ?Off Eliquis due to 2 recent admissions for GI bleed.  Hemoglobin 8.2 ?Patient prefers not to restart Eliquis ? ?Paroxysmal atrial fibrillation (HCC) ?Rate controlled with amiodarone.  Heart rate in 50s to 60s and may have to dose of amiodarone.  Off Eliquis due to recent GI bleed.  Patient prefers not to resume Eliquis at this point ? ?Essential hypertension, benign ?Continue amiodarone and Lasix.  Blood pressure stable ? ?Acute renal failure superimposed on stage 3b chronic kidney disease ([place]) ?Lab Results  ?Component Value Date  ? CREATININE 1.93 (H) 08/10/2021  ? CREATININE 1.79 (H) 08/09/2021  ? CREATININE 1.87 (H) 08/08/2021  ?Monitor renal function while on diuretics ? ?BMI 38.0-38.9,adult ?Morbid obesity with BMI of 40 and hypertension.  Complicates overall prognosis ? ? ? ? ?  ? ?Subjective: Quite dyspneic at rest  and has significant lower extremity edema ? ?Physical Exam: ?Vitals:  ? 08/09/21 2317 08/10/21 0331 08/10/21 0533 08/10/21 1135  ?BP: (!) 116/46 (!) 113/52  (!) 118/52  ?Pulse: (!) 55 (!) 57  (!) 54  ?Resp: 16 16    ?Temp: 97.8 ?F (36.6 ?C) 99.2 ?F (37.3 ?C)  98.6 ?F (37 ?C)  ?TempSrc: Axillary Axillary  Oral  ?SpO2: 98% 99%  92%  ?Weight:   93 kg   ?Height:      ? ?General exam: Appears calm and comfortable  ?Respiratory system: Bilateral crackles.  Normal work of breathing.  2 L ?Cardiovascular system: S1-S2, tachycardic, regular rhythm, no murmurs, marked pitting edema BLE ?Gastrointestinal system: Obese, NT/ND, normal bowel sounds ?Central nervous system: Alert and oriented. No focal neurological deficits. ?Extremities: Symmetric 5 x 5 power. ?Skin: No rashes, lesions or ulcers ?Psychiatry: Judgement and insight appear normal. Mood & affect appropriate.  ?Data Reviewed: ? ?Hemoglobin 8.2.  Creatinine 1.79 ? ?Family Communication: None ? ?Disposition: ?Status is: Inpatient ?Remains inpatient appropriate because: Ongoing diuresis, palliative care consult ? ? Planned Discharge Destination: Home with Home Health ? ? ? DVT prophylaxis- SCDs ?Time spent: 35 minutes ? ?Author: ?Max Sane, MD ?08/10/2021 3:33 PM ? ?For on call review [email or web address].  ?

## 2021-08-10 NOTE — Hospital Course (Addendum)
81 y.o. female with medical history significant for paroxysmal A-fib on Eliquis, HFpEF, coronary artery disease status post PCI of RCA in 2017, essential hypertension, OSA on CPAP, chronic lower extremity edema, CKD 3B, hyperlipidemia, GERD, type 2 diabetes, recently diagnosed erosive gastritis (EGD 07/10/21) on twice daily PPI, who presented to [facility] ED with complaints of gradually worsening shortness of breath of 1 month duration.  Associated with 15 pounds weight gain, and abdominal distention within 2 weeks.  Admitted for acute on chronic CHF exacerbation ? ?4/12: Continue ongoing diuresis per cardiology ?4/13: Hemoglobin 7.6, getting 1 PRBC transfusion.  Potassium 3.0 and being replaced ?4/14: Hemoglobin improved status post 1 PRBC transfusion yesterday.  Ongoing diuresis per cardiology ?4/15-4/17: Continue ongoing diuresis ?

## 2021-08-10 NOTE — Assessment & Plan Note (Signed)
Off Eliquis due to 2 recent admissions for GI bleed.  Hemoglobin 8.2 ?Patient prefers not to restart Eliquis ?

## 2021-08-10 NOTE — Progress Notes (Signed)
Mobility Specialist - Progress Note ? ? 08/10/21 1200  ?Mobility  ?Activity Ambulated with assistance in hallway  ?Level of Assistance Standby assist, set-up cues, supervision of patient - no hands on  ?Assistive Device Front wheel walker  ?Activity Response Tolerated well  ?$Mobility charge 1 Mobility  ? ? ? ?During mobility: 76 HR, 99% SpO2 ? ? ?Pt lying in bed upon arrival, utilizing RA. Pt completed bed mobility modI. Ambulated with RW in hallway at supervision. Voiced weakness in extremities and SOB. Does feel increased security with RW as opposed to [facility]. Distance limited d/t fatigue and weakness. Pt returned EOB with needs in reach.  ? ? ?Kathee Delton ?Mobility Specialist ?08/10/21, 1:03 PM ? ? ?

## 2021-08-10 NOTE — Assessment & Plan Note (Signed)
EF 55 to 60% on last echo.  She seems markedly volume overloaded and dyspneic at rest. ?Net IO Since Admission: -800 mL [08/10/21 1528]  ?Admitted different diuretic if nonresponsive to Lasix ?Cardiology following ?

## 2021-08-10 NOTE — Progress Notes (Signed)
PT Cancellation Note ? ?Patient Details ?Name: April [name] ?MRN: [id number] ?DOB: Aug 18, 1940 ? ? ?Cancelled Treatment:    Reason Eval/Treat Not Completed: Other (comment) ?Arrived in pt room, she was up in recliner in NAD.  She did work with OT and do ~100 ft of ambulate with mobility tech earlier today but reports that she did get tired and winded with the effort.  She very pleasantly refused further mobility/activity today. Will maintain on caseload and continue to attempt as appropriate.  ? ?Kreg Shropshire, DPT ?08/10/2021, 5:56 PM ?

## 2021-08-10 NOTE — Assessment & Plan Note (Signed)
Lab Results  ?Component Value Date  ? CREATININE 1.93 (H) 08/10/2021  ? CREATININE 1.79 (H) 08/09/2021  ? CREATININE 1.87 (H) 08/08/2021  ?Monitor renal function while on diuretics ?

## 2021-08-11 DIAGNOSIS — K922 Gastrointestinal hemorrhage, unspecified: Secondary | ICD-10-CM | POA: Diagnosis not present

## 2021-08-11 DIAGNOSIS — N179 Acute kidney failure, unspecified: Secondary | ICD-10-CM | POA: Diagnosis not present

## 2021-08-11 DIAGNOSIS — D649 Anemia, unspecified: Secondary | ICD-10-CM | POA: Diagnosis not present

## 2021-08-11 DIAGNOSIS — E876 Hypokalemia: Secondary | ICD-10-CM

## 2021-08-11 DIAGNOSIS — Z7189 Other specified counseling: Secondary | ICD-10-CM | POA: Diagnosis not present

## 2021-08-11 DIAGNOSIS — I480 Paroxysmal atrial fibrillation: Secondary | ICD-10-CM | POA: Diagnosis not present

## 2021-08-11 DIAGNOSIS — Z6838 Body mass index (BMI) 38.0-38.9, adult: Secondary | ICD-10-CM | POA: Diagnosis not present

## 2021-08-11 DIAGNOSIS — I5033 Acute on chronic diastolic (congestive) heart failure: Secondary | ICD-10-CM | POA: Diagnosis not present

## 2021-08-11 DIAGNOSIS — I10 Essential (primary) hypertension: Secondary | ICD-10-CM | POA: Diagnosis not present

## 2021-08-11 DIAGNOSIS — I509 Heart failure, unspecified: Secondary | ICD-10-CM | POA: Diagnosis not present

## 2021-08-11 LAB — HEMOGLOBIN AND HEMATOCRIT, BLOOD
HCT: 32.3 % — ABNORMAL LOW (ref 36.0–46.0)
Hemoglobin: 10.4 g/dL — ABNORMAL LOW (ref 12.0–15.0)

## 2021-08-11 LAB — BASIC METABOLIC PANEL
Anion gap: 7 (ref 5–15)
BUN: 36 mg/dL — ABNORMAL HIGH (ref 8–23)
CO2: 26 mmol/L (ref 22–32)
Calcium: 8.1 mg/dL — ABNORMAL LOW (ref 8.9–10.3)
Chloride: 104 mmol/L (ref 98–111)
Creatinine, Ser: 1.74 mg/dL — ABNORMAL HIGH (ref 0.44–1.00)
GFR, Estimated: 29 mL/min — ABNORMAL LOW (ref >60)
Glucose, Bld: 96 mg/dL (ref 70–99)
Potassium: 3 mmol/L — ABNORMAL LOW (ref 3.5–5.1)
Sodium: 137 mmol/L (ref 135–145)

## 2021-08-11 LAB — CBC
HCT: 23.2 % — ABNORMAL LOW (ref 36.0–46.0)
Hemoglobin: 7.6 g/dL — ABNORMAL LOW (ref 12.0–15.0)
MCH: 29.7 pg (ref 26.0–34.0)
MCHC: 32.8 g/dL (ref 30.0–36.0)
MCV: 90.6 fL (ref 80.0–100.0)
Platelets: 194 10*3/uL (ref 150–400)
RBC: 2.56 MIL/uL — ABNORMAL LOW (ref 3.87–5.11)
RDW: 14.6 % (ref 11.5–15.5)
WBC: 6.7 10*3/uL (ref 4.0–10.5)
nRBC: 0 % (ref 0.0–0.2)

## 2021-08-11 LAB — PREPARE RBC (CROSSMATCH)

## 2021-08-11 LAB — MAGNESIUM: Magnesium: 2.2 mg/dL (ref 1.7–2.4)

## 2021-08-11 MED ORDER — POTASSIUM CHLORIDE CRYS ER 20 MEQ PO TBCR
20.0000 meq | EXTENDED_RELEASE_TABLET | Freq: Once | ORAL | Status: AC
  Administered 2021-08-11: 20 meq via ORAL
  Filled 2021-08-11: qty 1

## 2021-08-11 MED ORDER — SODIUM CHLORIDE 0.9% IV SOLUTION: Freq: Once | INTRAVENOUS | Status: AC

## 2021-08-11 MED ORDER — POTASSIUM CHLORIDE CRYS ER 20 MEQ PO TBCR
40.0000 meq | EXTENDED_RELEASE_TABLET | Freq: Two times a day (BID) | ORAL | Status: DC
  Administered 2021-08-11 – 2021-08-15 (×10): 40 meq via ORAL
  Filled 2021-08-11 (×10): qty 2

## 2021-08-11 MED ORDER — FUROSEMIDE 10 MG/ML IJ SOLN
20.0000 mg | Freq: Once | INTRAMUSCULAR | Status: AC
  Administered 2021-08-11: 20 mg via INTRAVENOUS
  Filled 2021-08-11: qty 2

## 2021-08-11 NOTE — Assessment & Plan Note (Addendum)
Replete and recheck.  Pharmacy managing ?

## 2021-08-11 NOTE — Assessment & Plan Note (Addendum)
Hemoglobin dropped to 7.6 this morning.  Considering cardiac history we will [place] transfuse 1 PRBC and give Lasix to offload volume after transfusion.  Patient has undergone upper and lower endoscopy without any conclusive bleeder.  She reports no obvious bleed or melena ?

## 2021-08-11 NOTE — Consult Note (Signed)
PHARMACY CONSULT NOTE - FOLLOW UP ? ?Pharmacy Consult for Electrolyte Monitoring and Replacement  ? ?Recent Labs: ?Potassium (mmol/L)  ?Date Value  ?08/11/2021 3.0 (L)  ?03/23/2014 3.9  ? ?Magnesium (mg/dL)  ?Date Value  ?08/09/2021 2.2  ? ?Calcium (mg/dL)  ?Date Value  ?08/11/2021 8.1 (L)  ? ?Albumin (g/dL)  ?Date Value  ?08/09/2021 2.4 (L)  ? ?Phosphorus (mg/dL)  ?Date Value  ?08/09/2021 4.0  ? ?Sodium (mmol/L)  ?Date Value  ?08/11/2021 137  ?04/29/2018 147 (H)  ? ? ? ?Assessment: ?81 y.o. female with medical history significant for paroxysmal A-fib on Eliquis, HFpEF, coronary artery disease status post PCI of RCA in 2017, essential hypertension, OSA on CPAP, chronic lower extremity edema, CKD 3B, hyperlipidemia, GERD, type 2 diabetes, recently diagnosed erosive gastritis (EGD 07/10/21) on twice daily PPI, who presented to [facility] ED with complaints of gradually worsening shortness of breath of 1 month duration.  ? ?Currently on lasix 60 mg IV BID, and amiodarone 200 mg daily,  ? ?Goal of Therapy:  ?K+ > 4 ?Mg > 2 ? ?Plan:  ?KCL 40 mEq BID. Will give an additional K+ 20 mEq at lunch time.  ?Mg add on ordered.  ? ?Oswald Hillock ,PharmD ?Clinical Pharmacist ?08/11/2021 8:30 AM ? ?

## 2021-08-11 NOTE — Assessment & Plan Note (Signed)
Rate controlled with amiodarone.  Heart rate in 50s to 60s and may have to dose of amiodarone.  Off Eliquis due to recent GI bleed.  Patient prefers not to resume Eliquis at this point ?

## 2021-08-11 NOTE — Assessment & Plan Note (Signed)
Morbid obesity with BMI of 40 and hypertension.  Complicates overall prognosis ?

## 2021-08-11 NOTE — Assessment & Plan Note (Signed)
Lab Results  ?Component Value Date  ? CREATININE 1.74 (H) 08/11/2021  ? CREATININE 1.93 (H) 08/10/2021  ? CREATININE 1.79 (H) 08/09/2021  ?Monitor renal function while on diuretics ?

## 2021-08-11 NOTE — Care Management Important Message (Signed)
Important Message ? ?Patient Details  ?Name: April [name] ?MRN: [id number] ?Date of Birth: 1941/01/10 ? ? ?Medicare Important Message Given:  Yes ? ? ? ? ?Dannette Barbara ?08/11/2021, 2:17 PM ?

## 2021-08-11 NOTE — Progress Notes (Signed)
PT Cancellation Note ? ?Patient Details ?Name: April [name] ?MRN: [id number] ?DOB: 21-Oct-1940 ? ? ?Cancelled Treatment:     Pt had just started receiving blood due to Hgb at 7.6.  Discussed current LOF and pt would like to try ambulating with a Rollator next visit if possible.  Continue PT per POC next available date/time. ? ? ?Josie Dixon ?08/11/2021, 12:57 PM ?

## 2021-08-11 NOTE — Assessment & Plan Note (Addendum)
EF 55 to 60% on last echo.  She seems markedly volume overloaded and dyspneic at rest. ?Net IO Since Admission: -1,758 mL [08/11/21 1728]  ?Continue IV Lasix for now.  Will require ongoing diuresis for at least next few days ?

## 2021-08-11 NOTE — Consult Note (Addendum)
? ?                                                                                ?Consultation Note ?Date: 08/11/2021  ? ?Patient Name: April [name]  ?DOB: 07/05/1940  MRN: [id number]  Age / Sex: 81 y.o., female  ?PCP: Einar Pheasant, MD ?Referring Physician: Max Sane, MD ? ?Reason for Consultation: Establishing goals of care ? ?HPI/Patient Profile: April [name] is a 81 y.o. female with medical history significant for paroxysmal A-fib on Eliquis, HFpEF, coronary artery disease status post PCI of RCA in 2017, essential hypertension, OSA on CPAP, chronic lower extremity edema, CKD 3B, hyperlipidemia, GERD, type 2 diabetes, recently diagnosed erosive gastritis (EGD 07/10/21) on twice daily PPI, who presented to [facility] ED with complaints of gradually worsening shortness of breath of 1 month duration.  Associated with 15 pounds weight gain, and abdominal distention within 2 weeks.  Patient initially called her PCP who recommended that she comes to the ED for further evaluation.   ?  ?Upon presentation to the ED patient is noted to be significantly volume overload on exam, from lower extremities to abdomen. Additionally, labs revealed elevated BNP greater than 1,000.  Chest x-ray with findings suggestive of pulmonary edema and new small bilateral pleural effusions.  She was started on IV diuresing in the ED, not hypoxic.  EDP requested admission for further evaluation and management of her present condition.  Admitted by hospitalist service, [facility]. ? ?Clinical Assessment and Goals of Care: ? ?Notes, labs, diagnostics reviewed.  Patient is sitting in bedside chair, after walking to it from the bathroom with her cane.  She states she was widowed around 8 years ago.  She states her sister died the following year.  She states one of her children died the following year from that.  She has 1 remaining child. ? ?She states at baseline she is able to walk with her cane and will start to wheeze if she walks long distances.   She states she lives at [place] so she does not have to do any cooking or cleaning. ? ?We discussed her diagnoses, prognosis, GOC, EOL wishes disposition and options. ? ?Created space and opportunity for patient  to explore thoughts and feelings regarding current medical information.  ? ?A detailed discussion was had today regarding advanced directives.  Concepts specific to code status, artifical feeding and hydration, IV antibiotics and rehospitalization were discussed.  The difference between an aggressive medical intervention path and a comfort care path was discussed.  Values and goals of care important to patient and family were attempted to be elicited. ? ?Discussed limitations of medical interventions to prolong quality of life in some situations and discussed the concept of human mortality. ? ?Patient tells me she has completed advanced directives.  She states she would like all care to keep her alive as long as "my brain is working".  Clarified her meaning for "brain working".  She states as long as she is oriented and able to make her own decisions, she considers that her brain working.  She states she would want to be placed on a ventilator, but would not want to live in a  vegetative state.  She states she would want CPR.  Discussed with her the typical outcomes of CPR or resuscitation, and she states she will consider DNR status, as this will likely not provide a quality of life that she would be okay with.  She states she will consider timeframes for being on ventilator support, but does not know those at this time. She states she would not ever want a feeding tube. She is aware that a MOST form can be filled out as well as a new advance directive prior to her discharge if she chooses.  She states it will take time to consider these wishes, and she does not want to make any changes at this time. Encouraged her to speak with her son about her wishes after she is able to consider them.  ? ? ? ?SUMMARY  OF RECOMMENDATIONS   ? ?Patient tells me as long as her "brain is working", which she defines as  maintaining cognitive orientation such that she can make decisions for herself, she would like all care.  Given her wishes for quality of life where she is oriented, she is giving thought to [place].  She is also giving thought to timeframes for being on ventilator support, though she is able to articulate that she does not want to live in a vegetative state.  She is aware that a new MOST form or advanced directive can be completed prior to her discharge. ? ?Recommend outpatient palliative to follow ? ?  ? ?Primary Diagnoses: ?Present on Admission: ? Acute on chronic diastolic CHF (congestive heart failure) ([place]) ? Paroxysmal atrial fibrillation (HCC) ? GI bleeding ? Essential hypertension, benign ? Acute renal failure superimposed on stage 3b chronic kidney disease ([place]) ? ? ?I have reviewed the medical record, interviewed the patient and family, and examined the patient. The following aspects are pertinent. ? ?Past Medical History:  ?Diagnosis Date  ? Anemia   ? CAD S/P PCI pRCA Promus DES 3.5 x 16 (4.1 mm), ostRPDA Promus DES 2.5 x 12 (2.7 mm) 03/25/2016  ? CAD S/P percutaneous coronary angioplasty   ? a. 07/2015 MV: No ischemia, EF 66%;  b. 03/2016 Inflat STEMI/PCI: LM nl, LAD 25d, RI nl, LCX nl, OM1/2 nl, RCA 80p (3.5x16 Promus Premier DES), 50p/m, 30d, RPDA 90 (2.5x12 Promu Premier DES); c. 01/2018 Cath ([place], [place]): Patent RCA stents->Med Rx.  ? CKD (chronic kidney disease), stage III ([place])   ? Diastolic dysfunction   ? a. 10/2013 Echo: EF 55-65%, Gr1 DD; b. 01/2018 Echo ([place], [place]): EF 55-60%, Gr2 DD, RVSP 28mHg.  ? Diverticulitis   ? Dyspnea   ? Endometriosis   ? Family history of adverse reaction to anesthesia   ? Mother - had to stay in ICU because of breathing difficulties every time she had anesthesia  ? GERD (gastroesophageal reflux disease)   ? GI bleed   ? a.  06/2021 - melena-->2 u PRBCs.  ? Hiatal hernia   ? History of colon polyps 08/1994  ? History of Migraines   ? Hypercholesterolemia   ? Hypertension   ? LBBB (left bundle branch block)   ? Osteoporosis   ? PAF (paroxysmal atrial fibrillation) ([place]   ? a. 03/2016 Dx @ time of MI; b. CHA2DS2VASc = 5-->Eliquis.  ? Rheumatic fever   ? Sleep apnea   ? CPAP  ? Spasmodic dysphonia   ? ST elevation myocardial infarction (STEMI) of inferolateral wall, initial episode of  care [facility]) 03/25/2016  ? ST elevation myocardial infarction (STEMI) of inferolateral wall, initial episode of care [facility]) 03/25/2016  ? Urine incontinence   ? ?Social History  ? ?Socioeconomic History  ? Marital status: Widowed  ?  Spouse name: Not on file  ? Number of children: 2  ? Years of education: Not on file  ? Highest education level: Not on file  ?Occupational History  ? Not on file  ?Tobacco Use  ? Smoking status: Never  ? Smokeless tobacco: Never  ?Vaping Use  ? Vaping Use: Never used  ?Substance and Sexual Activity  ? Alcohol use: No  ?  Alcohol/week: 0.0 standard drinks  ? Drug use: No  ? Sexual activity: Not Currently  ?  Birth control/protection: Post-menopausal  ?Other Topics Concern  ? Not on file  ?Social History Narrative  ? Not on file  ? ?Social Determinants of Health  ? ?Financial Resource Strain: Not on file  ?Food Insecurity: Not on file  ?Transportation Needs: Not on file  ?Physical Activity: Not on file  ?Stress: Not on file  ?Social Connections: Not on file  ? ?Family History  ?Problem Relation Age of Onset  ? Arthritis Mother   ? Stroke Mother   ? Hypertension Mother   ? Osteoporosis Mother   ? Heart disease Father   ?     MI  ? Heart attack Father   ? Stroke Brother   ? Heart attack Sister   ? Breast cancer Other   ?     first cousin x 2  ? Stroke Daughter   ? Heart attack Daughter   ? Colon cancer Neg Hx   ? ?Scheduled Meds: ? amiodarone  200 mg Oral Daily  ? cholecalciferol  1,000 Units Oral Daily  ? furosemide  60 mg Intravenous  BID  ? mometasone-formoterol  2 puff Inhalation BID  ? pantoprazole  40 mg Oral BID  ? polyvinyl alcohol  1 drop Both Eyes Daily  ? potassium chloride  40 mEq Oral BID  ? rosuvastatin  40 mg Oral Daily

## 2021-08-11 NOTE — Assessment & Plan Note (Signed)
Off Eliquis due to 2 recent admissions for GI bleed.  Hemoglobin 8.2 ?Patient prefers not to restart Eliquis. ? ?Son at bedside and is in agreement.  They may consider out patient evaluation for different anticoagulation if H&H remained stable ?

## 2021-08-11 NOTE — Assessment & Plan Note (Signed)
Continue amiodarone and Lasix.  Blood pressure stable ?

## 2021-08-11 NOTE — Progress Notes (Addendum)
?Progress Note ? ? ?Patient: April [name] QHU:[id number] DOB: [birth year] DOA: 08/08/2021     3 ?DOS: the patient was seen and examined on 08/11/2021 ?  ?Brief hospital course: ?81 y.o. female with medical history significant for paroxysmal A-fib on Eliquis, HFpEF, coronary artery disease status post PCI of RCA in 2017, essential hypertension, OSA on CPAP, chronic lower extremity edema, CKD 3B, hyperlipidemia, GERD, type 2 diabetes, recently diagnosed erosive gastritis (EGD 07/10/21) on twice daily PPI, who presented to [facility] ED with complaints of gradually worsening shortness of breath of 1 month duration.  Associated with 15 pounds weight gain, and abdominal distention within 2 weeks.  Admitted for acute on chronic CHF exacerbation ? ?4/12: Continue ongoing diuresis per cardiology ?4/13: Hemoglobin 7.6, getting 1 PRBC transfusion.  Potassium 3.0 and being replaced ? ? ?Assessment and Plan: ?* Acute on chronic diastolic CHF (congestive heart failure) ([place]) ?EF 55 to 60% on last echo.  She seems markedly volume overloaded and dyspneic at rest. ?Net IO Since Admission: -1,758 mL [08/11/21 1728]  ?Continue IV Lasix for now.  Will require ongoing diuresis for at least next few days ? ?GI bleeding ?Off Eliquis due to 2 recent admissions for GI bleed.  Hemoglobin 8.2 ?Patient prefers not to restart Eliquis. ? ?Son at bedside and is in agreement.  They may consider out patient evaluation for different anticoagulation if H&H remained stable ? ?Paroxysmal atrial fibrillation (HCC) ?Rate controlled with amiodarone.  Heart rate in 50s to 60s and may have to dose of amiodarone.  Off Eliquis due to recent GI bleed.  Patient prefers not to resume Eliquis at this point ? ?Essential hypertension, benign ?Continue amiodarone and Lasix.  Blood pressure stable ? ?Acute renal failure superimposed on stage 3b chronic kidney disease ([place]) ?Lab Results  ?Component Value Date  ? CREATININE 1.74 (H) 08/11/2021  ? CREATININE 1.93 (H)  08/10/2021  ? CREATININE 1.79 (H) 08/09/2021  ?Monitor renal function while on diuretics ? ?Hypokalemia ?Replete and recheck, check magnesium.  Pharmacy monitoring and managing electrolytes ? ?Symptomatic anemia ?Hemoglobin dropped to 7.6 this morning.  Considering cardiac history we will [place] transfuse 1 PRBC and give Lasix to offload volume after transfusion.  Patient has undergone upper and lower endoscopy without any conclusive bleeder.  She reports no obvious bleed or melena ? ?BMI 38.0-38.9,adult ?Morbid obesity with BMI of 40 and hypertension.  Complicates overall prognosis ? ? ? ? ?  ? ?Subjective: Still dyspneic at rest, hemoglobin 7.6.  Son at bedside ? ?Physical Exam: ?Vitals:  ? 08/11/21 1117 08/11/21 1438 08/11/21 1646 08/11/21 2017  ?BP: (!) 118/52 (!) 125/59 134/67 (!) 155/61  ?Pulse: (!) 55 (!) 59 (!) 56 (!) 58  ?Resp: '15 16 17 16  '$ ?Temp: 97.7 ?F (36.5 ?C) 97.9 ?F (36.6 ?C) 97.8 ?F (36.6 ?C) 98.2 ?F (36.8 ?C)  ?TempSrc: Oral Oral Oral Oral  ?SpO2: 98% 99% 100% 99%  ?Weight:      ?Height:      ? ?General exam: Appears calm and comfortable  ?Respiratory system: Bilateral crackles.  Normal work of breathing.  2 L ?Cardiovascular system: S1-S2, tachycardic, regular rhythm, no murmurs, marked pitting edema BLE ?Gastrointestinal system: Obese, NT/ND, normal bowel sounds ?Central nervous system: Alert and oriented. No focal neurological deficits. ?Extremities: Symmetric 5 x 5 power. ?Skin: Pallor present, no rashes, lesions or ulcers ?Psychiatry: Judgement and insight appear normal. Mood & affect appropriate.  ? ?Data Reviewed: ? ?Hemoglobin 7.6 ? ?Family Communication: Son [name] updated at bedside ? ?  Disposition: ?Status is: Inpatient ?Remains inpatient appropriate because: Getting ongoing diuresis and 1 PRBC transfusion today ? ? Planned Discharge Destination: Home with Home Health ? ? ? DVT prophylaxis-SCDs ?Time spent: 35 minutes ? ?Author: ?Max Sane, MD ?08/11/2021 8:25 PM ? ?For on call review  [email or web address].  ?

## 2021-08-11 NOTE — Progress Notes (Signed)
? ?Cardiology Progress Note  ? ?Patient Name: April [name] ?Date of Encounter: 08/11/2021 ? ?Primary Cardiologist: Ida Rogue, MD ? ?Subjective  ? ?No chest pain or sob.  Ongoing lower ext edema, though sl improved.  Noted good response to metolazone last night, though not all UO recorded.  Wt down 2.1kg. ? ?Inpatient Medications  ?  ?Scheduled Meds: ? amiodarone  200 mg Oral Daily  ? cholecalciferol  1,000 Units Oral Daily  ? furosemide  60 mg Intravenous BID  ? mometasone-formoterol  2 puff Inhalation BID  ? pantoprazole  40 mg Oral BID  ? polyvinyl alcohol  1 drop Both Eyes Daily  ? rosuvastatin  40 mg Oral Daily  ? ?Continuous Infusions: ? ?PRN Meds: ?acetaminophen, albuterol  ? ?Vital Signs  ?  ?Vitals:  ? 08/10/21 2006 08/10/21 2035 08/11/21 0703 08/11/21 0800  ?BP: (!) 134/52 117/76 (!) 111/51 (!) 115/47  ?Pulse: (!) 56 (!) 55 61 (!) 55  ?Resp: '20 16 18   '$ ?Temp: 98.6 ?F (37 ?C) 98 ?F (36.7 ?C) 98.7 ?F (37.1 ?C)   ?TempSrc:  Oral Oral   ?SpO2: 98% 97% 95% 96%  ?Weight:      ?Height:      ? ? ?Intake/Output Summary (Last 24 hours) at 08/11/2021 0813 ?Last data filed at 08/11/2021 0142 ?Gross per 24 hour  ?Intake 860 ml  ?Output 925 ml  ?Net -65 ml  ? ?Filed Weights  ? 08/08/21 1741 08/10/21 0533  ?Weight: 93 kg 93 kg  ? ? ?Physical Exam  ? ?GEN: Obese, in no acute distress.  ?HEENT: Grossly normal.  ?Neck: Supple, no JVD, carotid bruits, or masses. ?Cardiac: RRR, 2/6 syst murmur throughout, no rubs or gallops. No clubbing, cyanosis.  3+ bilat LE edema to mid-thigh - slightly improved compared to yesterday.  Radials 2+, DP/PT 1+ and equal bilaterally.  ?Respiratory:  Respirations regular and unlabored, clear to auscultation bilaterally. ?GI: Soft, nontender, nondistended, BS + x 4. ?MS: no deformity or atrophy. ?Skin: warm and dry, no rash. ?Neuro:  Strength and sensation are intact. ?Psych: AAOx3.  Normal affect. ? ?Labs  ?  ?Chemistry ?Recent Labs  ?Lab 08/09/21 ?0430 08/10/21 ?9518 08/11/21 ?8416  ?NA  138 138 137  ?K 3.6 4.2 3.0*  ?CL 105 105 104  ?CO2 '24 26 26  '$ ?GLUCOSE 110* 94 96  ?BUN 29* 30* 36*  ?CREATININE 1.79* 1.93* 1.74*  ?CALCIUM 8.2* 8.5* 8.1*  ?PROT 5.5*  --   --   ?ALBUMIN 2.4*  --   --   ?AST 47*  --   --   ?ALT 25  --   --   ?ALKPHOS 75  --   --   ?BILITOT 0.7  --   --   ?GFRNONAA 28* 26* 29*  ?ANIONGAP '9 7 7  '$ ?  ? ?Hematology ?Recent Labs  ?Lab 08/08/21 ?1745 08/09/21 ?0430 08/11/21 ?6063  ?WBC 7.5 7.0 6.7  ?RBC 3.09* 2.82* 2.56*  ?HGB 9.1* 8.2* 7.6*  ?HCT 29.1* 26.3* 23.2*  ?MCV 94.2 93.3 90.6  ?MCH 29.4 29.1 29.7  ?MCHC 31.3 31.2 32.8  ?RDW 15.1 14.9 14.6  ?PLT 293 246 194  ? ? ?Cardiac Enzymes  ?Recent Labs  ?Lab 08/08/21 ?1745 08/08/21 ?1934  ?TROPONINIHS 15 16  ?   ? ?BNP ?   ?Component Value Date/Time  ? BNP 1,032.5 (H) 08/08/2021 1745  ? ? ?Lipids  ?Lab Results  ?Component Value Date  ? CHOL 129 01/20/2021  ? HDL 50.90 01/20/2021  ?  [place] 62 01/20/2021  ? TRIG 80.0 01/20/2021  ? CHOLHDL 3 01/20/2021  ? ? ?HbA1c  ?Lab Results  ?Component Value Date  ? HGBA1C 6.2 01/20/2021  ? ? ?Radiology  ?  ?DG Chest 2 View ? ?Result Date: 08/08/2021 ?CLINICAL DATA:  Shortness of breath. EXAM: CHEST - 2 VIEW COMPARISON:  07/06/2021 FINDINGS: Stable heart size. Stable appearance large hiatal hernia. Lungs demonstrate low lung volumes with new small bilateral pleural effusions and suggestion of mild pulmonary interstitial edema. No pneumothorax or focal airspace consolidation. IMPRESSION: 1. Suggestion of mild pulmonary interstitial edema with new small bilateral pleural effusions. 2. Stable large hiatal hernia. Electronically Signed   By: Aletta Edouard M.D.   On: 08/08/2021 18:44  ? ?Telemetry  ?  ?Sinus rhythm/sinus brady - predominantly mid 50's to 60's, 1st deg avb - Personally Reviewed ? ?Cardiac Studies  ? ?2D Echocardiogram 04.13.2023 ? ?1. Left ventricular ejection fraction, by estimation, is 60 to 65%. The  ?left ventricle has normal function. The left ventricle has no regional  ?wall motion  abnormalities. Left ventricular diastolic parameters are  ?consistent with Grade II diastolic  ?dysfunction (pseudonormalization).  ? 2. Right ventricular systolic function is normal. The right ventricular  ?size is mildly enlarged. There is mildly elevated pulmonary artery  ?systolic pressure. The estimated right ventricular systolic pressure is  ?19.6 mmHg.  ? 3. Left atrial size was mildly dilated.  ? 4. Right atrial size was mildly dilated.  ? 5. The mitral valve is normal in structure. Mild mitral valve  ?regurgitation.  ? 6. Tricuspid valve regurgitation is moderate.  ? 7. The aortic valve is grossly normal. Aortic valve regurgitation is not  ?visualized.  ? 8. The inferior vena cava is normal in size with greater than 50%  ?respiratory variability, suggesting right atrial pressure of 3 mmHg.  ? ?Patient Profile  ?   ? 81 y.o. female with a hx of CAD s/p prior RCA and pDA stenting, HFpEF, PAF on Eliquis, anemia with recurrent GI bleeding, CKD stage III, HTN, HLD, and sleep apnea, who was admitted 4/10 w/ ongoing lower ext edema/volume overload, and wt gain. ? ?Assessment & Plan  ?  ?1.  Acute on chronic HFpEF: Patient with significant weight gain and volume overload following 2 separate admissions for GI bleed and acute kidney injury resulting in holding of home diuretics with fluid resuscitation and packed red blood cells.  EF 60-65% w/ GrII DD by echo this admission.  Provided metolazone 4/12 w/ improved creat this AM (BUN sl higher).  She notes very good response to metolazone last night, though not all UO was recorded.  Just had about 500 ml out.  Edema sl improved, though still well-above baseline level.  Wt down 2.1 kg.  Cont current dose of lasix.  Supp K (3.0).  With better response this AM, will hold off on additional metolazone @ this time, but may need to re-dose @ some point. ? ?2.  PAF:  Maintaining sinus on PO amio.  Rates stable in mid-50's w/ 1st deg avb.  Cont current dose.  [place] on hold after  2 recent GIBs - pt prefers to avoid going forward (cleared by GI to resume). ? ?3.  Essential HTN:  stable. ? ?4.  GIB/Anemia: H/H down further this AM @ 7.6/23.2.  As above, recent admissions for GIB x 2 and [place] d/c'd.  Denies melena.  Would transfuse if hgb drops below 7. ? ?5.  CAD:  s/p prior inflat STEMI  in 2017 w/ PCI of the RCA and RPDA.  No c/p.  Cont statin.  No ASA in setting of recent GIB/ongoing anemia. ? ?6.  HL:  LDL 62 in 12/2020.  Cont statin rx. ? ?7.  CKD III:  BUN sl higher @ 36. Creat stable @ 1.74.  Follow w/ diuresis. ? ?8.  Hypokalemia:  in setting of IV diuresis/metolazone dosing on 4/12.  K 3.0 this AM.  Kdur 40 bid added. ? ?Signed, ?Murray Hodgkins, NP  ?08/11/2021, 8:13 AM   ? ?For questions or updates, please contact   ?Please consult [email or web address] for contact info under Cardiology/STEMI.  ?

## 2021-08-12 DIAGNOSIS — K922 Gastrointestinal hemorrhage, unspecified: Secondary | ICD-10-CM | POA: Diagnosis not present

## 2021-08-12 DIAGNOSIS — I480 Paroxysmal atrial fibrillation: Secondary | ICD-10-CM | POA: Diagnosis not present

## 2021-08-12 DIAGNOSIS — N179 Acute kidney failure, unspecified: Secondary | ICD-10-CM | POA: Diagnosis not present

## 2021-08-12 DIAGNOSIS — N1832 Chronic kidney disease, stage 3b: Secondary | ICD-10-CM

## 2021-08-12 DIAGNOSIS — I5033 Acute on chronic diastolic (congestive) heart failure: Secondary | ICD-10-CM | POA: Diagnosis not present

## 2021-08-12 LAB — TYPE AND SCREEN
ABO/RH(D): A POS
Antibody Screen: NEGATIVE
Unit division: 0

## 2021-08-12 LAB — CBC
HCT: 28.3 % — ABNORMAL LOW (ref 36.0–46.0)
Hemoglobin: 9.3 g/dL — ABNORMAL LOW (ref 12.0–15.0)
MCH: 29.6 pg (ref 26.0–34.0)
MCHC: 32.9 g/dL (ref 30.0–36.0)
MCV: 90.1 fL (ref 80.0–100.0)
Platelets: 188 10*3/uL (ref 150–400)
RBC: 3.14 MIL/uL — ABNORMAL LOW (ref 3.87–5.11)
RDW: 14.6 % (ref 11.5–15.5)
WBC: 6 10*3/uL (ref 4.0–10.5)
nRBC: 0 % (ref 0.0–0.2)

## 2021-08-12 LAB — BASIC METABOLIC PANEL
Anion gap: 8 (ref 5–15)
BUN: 32 mg/dL — ABNORMAL HIGH (ref 8–23)
CO2: 31 mmol/L (ref 22–32)
Calcium: 8.4 mg/dL — ABNORMAL LOW (ref 8.9–10.3)
Chloride: 100 mmol/L (ref 98–111)
Creatinine, Ser: 1.68 mg/dL — ABNORMAL HIGH (ref 0.44–1.00)
GFR, Estimated: 30 mL/min — ABNORMAL LOW (ref >60)
Glucose, Bld: 98 mg/dL (ref 70–99)
Potassium: 3 mmol/L — ABNORMAL LOW (ref 3.5–5.1)
Sodium: 139 mmol/L (ref 135–145)

## 2021-08-12 LAB — BPAM RBC
Blood Product Expiration Date: 202305062359
ISSUE DATE / TIME: 202304131058
Unit Type and Rh: 6200

## 2021-08-12 MED ORDER — RISAQUAD PO CAPS
2.0000 | ORAL_CAPSULE | Freq: Every day | ORAL | Status: DC
Start: 2021-08-12 — End: 2021-08-16
  Administered 2021-08-12 – 2021-08-15 (×4): 2 via ORAL
  Filled 2021-08-12 (×4): qty 2

## 2021-08-12 MED ORDER — LOPERAMIDE HCL 2 MG PO CAPS
2.0000 mg | ORAL_CAPSULE | Freq: Four times a day (QID) | ORAL | Status: DC | PRN
Start: 2021-08-12 — End: 2021-08-16
  Filled 2021-08-12: qty 1

## 2021-08-12 MED ORDER — POTASSIUM CHLORIDE CRYS ER 20 MEQ PO TBCR
40.0000 meq | EXTENDED_RELEASE_TABLET | Freq: Once | ORAL | Status: AC
  Administered 2021-08-12: 40 meq via ORAL
  Filled 2021-08-12: qty 2

## 2021-08-12 NOTE — Progress Notes (Signed)
PT Cancellation Note ? ?Patient Details ?Name: April [name] ?MRN: [id number] ?DOB: December 13, 1940 ? ? ?Cancelled Treatment:     PT attempt. Currently working with OT. Author will return shortly and continue to follow per current POC  ? ? ?Willette Pa ?08/12/2021, 3:43 PM ?

## 2021-08-12 NOTE — Assessment & Plan Note (Signed)
Rate controlled with amiodarone. Off anticoagulation due to recent GI bleed ?

## 2021-08-12 NOTE — Assessment & Plan Note (Signed)
Lab Results  ?Component Value Date  ? CREATININE 1.68 (H) 08/12/2021  ? CREATININE 1.74 (H) 08/11/2021  ? CREATININE 1.93 (H) 08/10/2021  ?Monitor renal function while on diuretics.  Creatinine improving with diuresis suggesting cardiorenal syndrome ?

## 2021-08-12 NOTE — Assessment & Plan Note (Signed)
Hemoglobin 9.3 (7.6) s/p 1 PRBC transfusion.  Patient has undergone upper and lower endoscopy without any conclusive bleeder.  She reports no obvious bleed or melena ?

## 2021-08-12 NOTE — Progress Notes (Signed)
?Progress Note ? ? ?Patient: April [name] PJA:[id number] DOB: [birth year] DOA: 08/08/2021     4 ?DOS: the patient was seen and examined on 08/12/2021 ?  ?Brief hospital course: ?81 y.o. female with medical history significant for paroxysmal A-fib on Eliquis, HFpEF, coronary artery disease status post PCI of RCA in 2017, essential hypertension, OSA on CPAP, chronic lower extremity edema, CKD 3B, hyperlipidemia, GERD, type 2 diabetes, recently diagnosed erosive gastritis (EGD 07/10/21) on twice daily PPI, who presented to [facility] [place] ED with complaints of gradually worsening shortness of breath of 1 month duration.  Associated with 15 pounds weight gain, and abdominal distention within 2 weeks.  Admitted for acute on chronic CHF exacerbation ? ?4/12: Continue ongoing diuresis per cardiology ?4/13: Hemoglobin 7.6, getting 1 PRBC transfusion.  Potassium 3.0 and being replaced ?4/14: Hemoglobin improved status post 1 PRBC transfusion yesterday.  Ongoing diuresis per cardiology ? ? ?Assessment and Plan: ?* Acute on chronic diastolic CHF (congestive heart failure) ([place]) ?EF 55 to 60% on last echo.  She seems markedly volume overloaded and dyspneic at rest. ?Net IO Since Admission: -3,658 mL [08/12/21 1414]  ?Continue IV Lasix for now.  Will require ongoing diuresis for at least next few days ? ?GI bleeding ?Off anticoagulation due to 2 recent admissions for GI bleed.  No obvious GI bleed.  Patient had undergone colonoscopy on 3/13 showing tubular adenoma, diverticulosis and nonbleeding internal hemorrhoids.  EGD showed mild erythematous gastric mucosa with no obvious bleeding.  Negative H. pylori ? ? ?Paroxysmal atrial fibrillation (HCC) ?Rate controlled with amiodarone. Off anticoagulation due to recent GI bleed ? ?Essential hypertension, benign ?Continue amiodarone and Lasix.  Blood pressure stable ? ?Acute renal failure superimposed on stage 3b chronic kidney disease ([place]) ?Lab Results  ?Component Value Date  ? CREATININE 1.68  (H) 08/12/2021  ? CREATININE 1.74 (H) 08/11/2021  ? CREATININE 1.93 (H) 08/10/2021  ?Monitor renal function while on diuretics.  Creatinine improving with diuresis suggesting cardiorenal syndrome ? ?Hypokalemia ?Replete and recheck.  Pharmacy managing ? ?Symptomatic anemia ?Hemoglobin 9.3 (7.6) s/p 1 PRBC transfusion.  Patient has undergone upper and lower endoscopy without any conclusive bleeder.  She reports no obvious bleed or melena ? ?BMI 38.0-38.9,adult ?Morbid obesity with BMI of 40 and hypertension.  Complicates overall prognosis ? ? ? ? ?  ? ?Subjective: Reports stomach upset.  Sitting in the chair overall feeling better. ? ?Physical Exam: ?Vitals:  ? 08/12/21 0500 08/12/21 0527 08/12/21 0911 08/12/21 1245  ?BP:  (!) 98/49 113/60 (!) 110/55  ?Pulse:  (!) 56 61 (!) 56  ?Resp:  '20 16 16  '$ ?Temp:  98.5 ?F (36.9 ?C) 98.8 ?F (37.1 ?C) 98.1 ?F (36.7 ?C)  ?TempSrc:  Oral    ?SpO2:  93% 96% 99%  ?Weight: 87.6 kg     ?Height:      ? ?General exam:?Appears calm and comfortable  ?Respiratory system:??Normal work of breathing.  ?Cardiovascular system:?S1-S2, tachycardic, regular rhythm, no murmurs, marked pitting edema BLE ?Gastrointestinal system:?Obese, NT/ND, normal bowel sounds ?Central nervous system:?Alert and oriented. No focal neurological deficits. ?Extremities: Symmetric 5 x 5 power. ?Skin: Pallor present, no rashes, lesions or ulcers ?Psychiatry:?Judgement and insight appear normal. Mood &?affect appropriate.? ? ?Data Reviewed: ? ?Potassium 3.0, hemoglobin 9.3 ? ?Family Communication: None ? ? ?Disposition: ?Status is: Inpatient ?Remains inpatient appropriate because: Getting diuresis per cardiology ? ? Planned Discharge Destination: Home with Home Health ? ? ? DVT prophylaxis-SCD ?Time spent: 35 minutes ? ?Author: ?Max Sane, MD ?08/12/2021 2:15  PM ? ?For on call review [email or web address].  ?

## 2021-08-12 NOTE — Telephone Encounter (Signed)
Pt called in to cancel upcoming appt... Pt stated that she would like to thank Juliann Pulse for advising her to go to ED... Pt stated that ED advised her that she was having heart failure.... Pt send her thank you to [facility]...  ?

## 2021-08-12 NOTE — Telephone Encounter (Signed)
Patient still in hospital.

## 2021-08-12 NOTE — Assessment & Plan Note (Signed)
Morbid obesity with BMI of 40 and hypertension.  Complicates overall prognosis ?

## 2021-08-12 NOTE — Progress Notes (Signed)
Occupational Therapy Treatment ?Patient Details ?Name: April [name] ?MRN: [id number] ?DOB: 05/01/1941 ?Today's Date: 08/12/2021 ? ? ?History of present illness 81 y.o. female with medical history significant for paroxysmal A-fib on Eliquis, HFpEF, coronary artery disease status post PCI of RCA in 2017, essential hypertension, OSA on CPAP, chronic lower extremity edema, CKD 3B, hyperlipidemia, GERD, type 2 diabetes, recently diagnosed erosive gastritis (EGD 07/10/21) on twice daily PPI, who presented to [facility] ED with complaints of gradually worsening shortness of breath of 1 month duration.  Associated with 15 pounds weight gain, and abdominal distention within 2 weeks.  Patient initially called her PCP who recommended that she comes to the ED for further evaluation. ?  ?OT comments ? Chart reviewed to date, pt greeted in bathroom agreeable to OT tx session. Tx session targeted progressing functional mobility, safety while completing ADL tasks. Pt performs all ADL mobility with supervision with SPC. Bathing tasks completed with MIN-MOD A with intermittent vcs for energy conservation techniques. Overall good carry over of skills from evaluation however pt continues to perform ADL below PLOF. Discharge recommendation remains appropriate and pt will continue to benefit from ongoing OT to address functional deficits.   ? ?Recommendations for follow up therapy are one component of a multi-disciplinary discharge planning process, led by the attending physician.  Recommendations may be updated based on patient status, additional functional criteria and insurance authorization. ?   ?Follow Up Recommendations ? Home health OT  ?  ?Assistance Recommended at Discharge Intermittent Supervision/Assistance  ?Patient can return home with the following ? A little help with walking and/or transfers;A little help with bathing/dressing/bathroom;Assist for transportation ?  ?Equipment Recommendations ? None recommended by OT  ?   ?Recommendations for Other Services   ? ?  ?Precautions / Restrictions Precautions ?Precautions: None ?Precaution Comments: low fall risk ?Restrictions ?Weight Bearing Restrictions: No  ? ? ?  ? ?Mobility Bed Mobility ?  ?  ?  ?  ?  ?  ?  ?General bed mobility comments: NT pt in bathroom pre session, in chair post session ?  ? ?Transfers ?Overall transfer level: Needs assistance ?Equipment used: Straight cane ?Transfers: Sit to/from Stand ?Sit to Stand: Supervision ?  ?  ?  ?  ?  ?  ?  ?  ?Balance Overall balance assessment: Needs assistance ?Sitting-balance support: Feet supported, Bilateral upper extremity supported ?Sitting balance-Leahy Scale: Good ?  ?  ?Standing balance support: Reliant on assistive device for balance, Single extremity supported, During functional activity ?Standing balance-Leahy Scale: Good ?  ?  ?  ?  ?  ?  ?  ?  ?  ?  ?  ?  ?   ? ?ADL either performed or assessed with clinical judgement  ? ?ADL Overall ADL's : Needs assistance/impaired ?  ?  ?Grooming: Wash/dry hands;Wash/dry face;Standing;Supervision/safety ?  ?Upper Body Bathing: Supervision/ safety;Standing ?  ?Lower Body Bathing: Supervison/ safety;Sit to/from stand;Min guard ?  ?Upper Body Dressing : Minimal assistance;Sitting ?  ?Lower Body Dressing: Maximal assistance ?Lower Body Dressing Details (indicate cue type and reason): for socks ?Toilet Transfer: Sales executive;Ambulation ?Toilet Transfer Details (indicate cue type and reason): with SPC ?Toileting- Clothing Manipulation and Hygiene: Supervision/safety;Sit to/from stand ?  ?  ?  ?Functional mobility during ADLs: Supervision/safety;Cane ?  ?  ? ?Extremity/Trunk Assessment   ?  ?  ?  ?  ?  ? ?Vision   ?  ?  ?Perception   ?  ?Praxis   ?  ? ?Cognition  Arousal/Alertness: Awake/alert ?Behavior During Therapy: [facility] for tasks assessed/performed ?Overall Cognitive Status: Within Functional Limits for tasks assessed ?  ?  ?  ?  ?  ?  ?  ?  ?  ?  ?  ?  ?  ?  ?  ?   ?General Comments: good safety awareness ?  ?  ?   ?Exercises Other Exercises ?Other Exercises: edu re: home safety, falls prevention, energy conservation techniques ? ?  ?Shoulder Instructions   ? ? ?  ?General Comments    ? ? ?Pertinent Vitals/ Pain       Pain Assessment ?Pain Assessment: No/denies pain ? ?Home Living   ?  ?  ?  ?  ?  ?  ?  ?  ?  ?  ?  ?  ?  ?  ?  ?  ?  ?  ? ?  ?Prior Functioning/Environment    ?  ?  ?  ?   ? ?Frequency ? Min 2X/week  ? ? ? ? ?  ?Progress Toward Goals ? ?OT Goals(current goals can now be found in the care plan section) ? Progress towards OT goals: Progressing toward goals ? ?Acute Rehab OT Goals ?Patient Stated Goal: feel better ?OT Goal Formulation: With patient ?Time For Goal Achievement: 08/26/21 ?Potential to Achieve Goals: Good  ?Plan Discharge plan remains appropriate   ? ?Co-evaluation ? ? ?   ?  ?  ?  ?  ? ?  ?AM-PAC OT "6 Clicks" Daily Activity     ?Outcome Measure ? ? Help from another person eating meals?: None ?Help from another person taking care of personal grooming?: None ?Help from another person toileting, which includes using toliet, bedpan, or urinal?: None ?Help from another person bathing (including washing, rinsing, drying)?: A Little ?Help from another person to put on and taking off regular upper body clothing?: None ?Help from another person to put on and taking off regular lower body clothing?: A Lot ?6 Click Score: 21 ? ?  ?End of Session Equipment Utilized During Treatment: Other (comment) [facility]) ? ?OT Visit Diagnosis: Unsteadiness on feet (R26.81);Repeated falls (R29.6);Muscle weakness (generalized) (M62.81) ?  ?Activity Tolerance Patient tolerated treatment well ?  ?Patient Left in chair;with call bell/phone within reach ?  ?Nurse Communication Mobility status ?  ? ?   ? ?Time: 3734-2876 ?OT Time Calculation (min): 19 min ? ?Charges: OT General Charges ?$OT Visit: 1 Visit ?OT Treatments ?$Self Care/Home Management : 8-22 mins ? ?Shanon Payor, OTD OTR/L   ?08/12/21, 5:56 PM  ?

## 2021-08-12 NOTE — Assessment & Plan Note (Signed)
Off anticoagulation due to 2 recent admissions for GI bleed.  No obvious GI bleed.  Patient had undergone colonoscopy on 3/13 showing tubular adenoma, diverticulosis and nonbleeding internal hemorrhoids.  EGD showed mild erythematous gastric mucosa with no obvious bleeding.  Negative H. pylori ? ?

## 2021-08-12 NOTE — Progress Notes (Signed)
? ?Progress Note ? ?Patient Name: April [name] ?Date of Encounter: 08/12/2021 ? ?[facility] Cardiologist: Ida Rogue, MD  ? ?Subjective  ? ?Reports legs are still swollen, even up to the thighs ?Has shortness of breath on exertion ?Weight continues to trend downward ?-3.6 L to date ?Transfusion x1 with improved hemoglobin 7 up to 9 ? ?Inpatient Medications  ?  ?Scheduled Meds: ? amiodarone  200 mg Oral Daily  ? cholecalciferol  1,000 Units Oral Daily  ? furosemide  60 mg Intravenous BID  ? mometasone-formoterol  2 puff Inhalation BID  ? pantoprazole  40 mg Oral BID  ? polyvinyl alcohol  1 drop Both Eyes Daily  ? potassium chloride  40 mEq Oral BID  ? rosuvastatin  40 mg Oral Daily  ? ?Continuous Infusions: ? ?PRN Meds: ?acetaminophen, albuterol  ? ?Vital Signs  ?  ?Vitals:  ? 08/12/21 0046 08/12/21 0500 08/12/21 0527 08/12/21 0911  ?BP: (!) 159/54  (!) 98/49 113/60  ?Pulse: (!) 58  (!) 56 61  ?Resp: '20  20 16  '$ ?Temp: 97.7 ?F (36.5 ?C)  98.5 ?F (36.9 ?C) 98.8 ?F (37.1 ?C)  ?TempSrc: Oral  Oral   ?SpO2: 98%  93% 96%  ?Weight:  87.6 kg    ?Height:      ? ? ?Intake/Output Summary (Last 24 hours) at 08/12/2021 1201 ?Last data filed at 08/12/2021 0531 ?Gross per 24 hour  ?Intake 667 ml  ?Output 2600 ml  ?Net -1933 ml  ? ? ?  08/12/2021  ?  5:00 AM 08/11/2021  ?  8:00 AM 08/10/2021  ?  5:33 AM  ?Last 3 Weights  ?Weight (lbs) 193 lb 1.6 oz 200 lb 8 oz 205 lb  ?Weight (kg) 87.59 kg 90.946 kg 92.987 kg  ?   ? ?Telemetry  ?  ?Normal sinus rhythm- Personally Reviewed ? ?ECG  ?  ?- Personally Reviewed ? ?Physical Exam  ? ?GEN: No acute distress.   ?Neck: Unable to estimate JVD ?Cardiac: RRR, no murmurs, rubs, or gallops.  ?1+ pitting woody edema ?Respiratory: Clear to auscultation bilaterally. ?GI: Soft, nontender, non-distended  ?MS: No edema; No deformity. ?Neuro:  Nonfocal  ?Psych: Normal affect  ? ?Labs  ?  ?High Sensitivity Troponin:   ?Recent Labs  ?Lab 08/08/21 ?1745 08/08/21 ?1934  ?TROPONINIHS 15 16  ?    ?Chemistry ?Recent Labs  ?Lab 08/09/21 ?0430 08/10/21 ?1478 08/11/21 ?2956 08/12/21 ?2130  ?NA 138 138 137 139  ?K 3.6 4.2 3.0* 3.0*  ?CL 105 105 104 100  ?CO2 '24 26 26 31  '$ ?GLUCOSE 110* 94 96 98  ?BUN 29* 30* 36* 32*  ?CREATININE 1.79* 1.93* 1.74* 1.68*  ?CALCIUM 8.2* 8.5* 8.1* 8.4*  ?MG 2.2  --  2.2  --   ?PROT 5.5*  --   --   --   ?ALBUMIN 2.4*  --   --   --   ?AST 47*  --   --   --   ?ALT 25  --   --   --   ?ALKPHOS 75  --   --   --   ?BILITOT 0.7  --   --   --   ?GFRNONAA 28* 26* 29* 30*  ?ANIONGAP '9 7 7 8  '$ ?  ?Lipids No results for input(s): CHOL, TRIG, HDL, LABVLDL, LDLCALC, CHOLHDL in the last 168 hours.  ?Hematology ?Recent Labs  ?Lab 08/09/21 ?0430 08/11/21 ?8657 08/11/21 ?2158 08/12/21 ?8469  ?WBC 7.0 6.7  --  6.0  ?  RBC 2.82* 2.56*  --  3.14*  ?HGB 8.2* 7.6* 10.4* 9.3*  ?HCT 26.3* 23.2* 32.3* 28.3*  ?MCV 93.3 90.6  --  90.1  ?MCH 29.1 29.7  --  29.6  ?MCHC 31.2 32.8  --  32.9  ?RDW 14.9 14.6  --  14.6  ?PLT 246 194  --  188  ? ?Thyroid No results for input(s): TSH, FREET4 in the last 168 hours.  ?BNP ?Recent Labs  ?Lab 08/08/21 ?1745  ?BNP 1,032.5*  ?  ?DDimer No results for input(s): DDIMER in the last 168 hours.  ? ?Radiology  ?  ?No results found. ? ?Cardiac Studies  ? ? ? ?Patient Profile  ?   ? 81 y.o. female with a hx of CAD s/p prior RCA and pDA stenting, HFpEF, PAF on Eliquis, anemia with recurrent GI bleeding, CKD stage III, HTN, HLD, and sleep apnea, who was admitted 4/10 w/ ongoing lower ext edema/volume overload, and wt gain. ? ?Assessment & Plan  ?  ?Acute on chronic diastolic CHF ?Ejection fraction 60 to 71%, grade 2 diastolic dysfunction, moderately elevated right heart pressures ?-Symptoms exacerbated by recent GI bleed, fluid resuscitation, anemia ?--Given continued symptoms of leg edema, shortness of breath, recommend we continue IV Lasix, may need treatment through the weekend ?  ?Anemia/GI bleed ?Hemoglobin improved after transfusion x1 ?Anticoagulation on hold in setting of GI bleed,  symptomatic anemia ?  ?Coronary artery disease with chronic stable angina ?Prior STEMI 2017 history of PCI ?Aspirin held in the setting of bleed, anemia ?Denies angina, no ischemic work-up needed at this time ?  ?Chronic kidney disease stage III ?Creatinine trending downward consistent with cardiorenal syndrome, 1.9 now down to 1.68 ?  ?Essential hypertension ?Blood pressure is well controlled on today's visit. No changes made to the medications. ? ?Paroxysmal atrial fibrillation ?Maintaining normal sinus rhythm, on amiodarone ?Anticoagulation on hold ?Not on beta-blocker secondary to bradycardia ? ?Discussed with patient in detail, hospitalist service ? Total encounter time more than 50 minutes ? Greater than 50% was spent in counseling and coordination of care with the patient ? ? ?For questions or updates, please contact [place] ?Please consult [email or web address] for contact info under  ? ?  ?   ?Signed, ?Ida Rogue, MD  ?08/12/2021, 12:01 PM    ?

## 2021-08-12 NOTE — Progress Notes (Signed)
Mobility Specialist - Progress Note ? ? 08/12/21 1500  ?Mobility  ?Activity Refused mobility  ? ? ? ?Pt politely declined mobility, no reason specified. Pt reporting fatigue and states she will wait until this evening to ambulate with family. Will attempt another date/time.  ? ? ?Kathee Delton ?Mobility Specialist ?08/12/21, 3:23 PM ? ?

## 2021-08-12 NOTE — Consult Note (Signed)
PHARMACY CONSULT NOTE - FOLLOW UP ? ?Pharmacy Consult for Electrolyte Monitoring and Replacement  ? ?Recent Labs: ?Potassium (mmol/L)  ?Date Value  ?08/12/2021 3.0 (L)  ?03/23/2014 3.9  ? ?Magnesium (mg/dL)  ?Date Value  ?08/11/2021 2.2  ? ?Calcium (mg/dL)  ?Date Value  ?08/12/2021 8.4 (L)  ? ?Albumin (g/dL)  ?Date Value  ?08/09/2021 2.4 (L)  ? ?Phosphorus (mg/dL)  ?Date Value  ?08/09/2021 4.0  ? ?Sodium (mmol/L)  ?Date Value  ?08/12/2021 139  ?04/29/2018 147 (H)  ? ? ? ?Assessment: ?81 y.o. female with medical history significant for paroxysmal A-fib on Eliquis, HFpEF, coronary artery disease status post PCI of RCA in 2017, essential hypertension, OSA on CPAP, chronic lower extremity edema, CKD 3B, hyperlipidemia, GERD, type 2 diabetes, recently diagnosed erosive gastritis (EGD 07/10/21) on twice daily PPI, who presented to [facility] ED with complaints of gradually worsening shortness of breath of 1 month duration.  ? ?Currently on lasix 60 mg IV BID, and amiodarone 200 mg daily,  ? ?Goal of Therapy:  ?K+ > 4 ?Mg > 2 ? ?Plan:  ?KCL 40 mEq BID. Will give an additional K+ 40 mEq at lunch time.  ?F/u with AM labs.  ? ?Oswald Hillock ,PharmD ?Clinical Pharmacist ?08/12/2021 8:07 AM ? ?

## 2021-08-12 NOTE — Assessment & Plan Note (Signed)
EF 55 to 60% on last echo.  She seems markedly volume overloaded and dyspneic at rest. ?Net IO Since Admission: -3,658 mL [08/12/21 1414]  ?Continue IV Lasix for now.  Will require ongoing diuresis for at least next few days ?

## 2021-08-12 NOTE — Progress Notes (Signed)
Physical Therapy Treatment ?Patient Details ?Name: April [name] ?MRN: [id number] ?DOB: 03/29/1941 ?Today's Date: 08/12/2021 ? ? ?History of Present Illness 81 y.o. female with medical history significant for paroxysmal A-fib on Eliquis, HFpEF, coronary artery disease status post PCI of RCA in 2017, essential hypertension, OSA on CPAP, chronic lower extremity edema, CKD 3B, hyperlipidemia, GERD, type 2 diabetes, recently diagnosed erosive gastritis (EGD 07/10/21) on twice daily PPI, who presented to [facility] ED with complaints of gradually worsening shortness of breath of 1 month duration.  Associated with 15 pounds weight gain, and abdominal distention within 2 weeks.  Patient initially called her PCP who recommended that she comes to the ED for further evaluation. ? ?  ?PT Comments  ? ? Pt Is A and O x 3. She is cooperative and pleasant throughout. Easily and safely able to stand and ambulate with SPC. Does require 2 occasions of standing rest breaks due to SOB. SOB resolves with standing rest and PLB. Overall pt is doing well but does not feel she is at baseline strength. Recommend HHPT at DC to assist pt to PLOF.  ?  ?Recommendations for follow up therapy are one component of a multi-disciplinary discharge planning process, led by the attending physician.  Recommendations may be updated based on patient status, additional functional criteria and insurance authorization. ? ?Follow Up Recommendations ? Other (comment) (HHPT through [facility]) ?  ?  ?Assistance Recommended at Discharge Set up Supervision/Assistance  ?Patient can return home with the following Assistance with cooking/housework;Assist for transportation ?  ?Equipment Recommendations ? None recommended by PT  ?  ?   ?Precautions / Restrictions Precautions ?Precautions: None ?Precaution Comments: low fall risk ?Restrictions ?Weight Bearing Restrictions: No  ?  ? ?Mobility ? Bed Mobility ? General bed mobility comments: in chair pre/post session ?   ? ?Transfers ?Overall transfer level: Needs assistance ?Equipment used: Straight cane ?Transfers: Sit to/from Stand ?Sit to Stand: Supervision ?  ?Ambulation/Gait ?Ambulation/Gait assistance: Supervision ?Gait Distance (Feet): 200 Feet ?Assistive device: Straight cane ?Gait Pattern/deviations: WFL(Within Functional Limits) ?Gait velocity: decreased ?  ?  ?General Gait Details: pt safely ambulated 200 ft with SPC but does require several standing rest breaks ? ? ? ? ?  ?Balance Overall balance assessment: Needs assistance ?Sitting-balance support: Feet supported, Bilateral upper extremity supported ?Sitting balance-Leahy Scale: Good ?  ?  ?Standing balance support: Reliant on assistive device for balance, Single extremity supported, During functional activity ?Standing balance-Leahy Scale: Good ?  ?  ?  ?Cognition Arousal/Alertness: Awake/alert ?Behavior During Therapy: [facility] for tasks assessed/performed ?Overall Cognitive Status: Within Functional Limits for tasks assessed ?  ?  ?   ?General Comments: pleasant and eager to participate in session ?  ?  ? ?  ?   ?   ? ?Pertinent Vitals/Pain Pain Assessment ?Pain Assessment: No/denies pain ?Pain Score: 0-No pain  ? ? ? ?PT Goals (current goals can now be found in the care plan section) Acute Rehab PT Goals ?Patient Stated Goal: return to [facility] ?Progress towards PT goals: Progressing toward goals ? ?  ?Frequency ? ? ? Min 2X/week ? ? ? ?  ?PT Plan Current plan remains appropriate  ? ? ?   ?AM-PAC PT "6 Clicks" Mobility   ?Outcome Measure ? Help needed turning from your back to your side while in a flat bed without using bedrails?: None ?Help needed moving from lying on your back to sitting on the side of a flat bed without using bedrails?: None ?Help needed  moving to and from a bed to a chair (including a wheelchair)?: None ?Help needed standing up from a chair using your arms (e.g., wheelchair or bedside chair)?: None ?Help needed to walk in hospital room?:  None ?Help needed climbing 3-5 steps with a railing? : A Little ?6 Click Score: 23 ? ?  ?End of Session Equipment Utilized During Treatment: Gait belt ?Activity Tolerance: Patient tolerated treatment well ?Patient left: with call bell/phone within reach ?Nurse Communication: Mobility status ?PT Visit Diagnosis: Muscle weakness (generalized) (M62.81);Difficulty in walking, not elsewhere classified (R26.2) ?  ? ? ?Time: 0383-3383 ?PT Time Calculation (min) (ACUTE ONLY): 17 min ? ?Charges:  $Gait Training: 8-22 mins          ?          ? ?Julaine Fusi PTA ?08/12/21, 4:26 PM  ? ?

## 2021-08-12 NOTE — Assessment & Plan Note (Signed)
Continue amiodarone and Lasix.  Blood pressure stable ?

## 2021-08-13 DIAGNOSIS — K922 Gastrointestinal hemorrhage, unspecified: Secondary | ICD-10-CM | POA: Diagnosis not present

## 2021-08-13 DIAGNOSIS — I480 Paroxysmal atrial fibrillation: Secondary | ICD-10-CM | POA: Diagnosis not present

## 2021-08-13 DIAGNOSIS — I5033 Acute on chronic diastolic (congestive) heart failure: Secondary | ICD-10-CM | POA: Diagnosis not present

## 2021-08-13 DIAGNOSIS — N170 Acute kidney failure with tubular necrosis: Secondary | ICD-10-CM | POA: Diagnosis not present

## 2021-08-13 DIAGNOSIS — I10 Essential (primary) hypertension: Secondary | ICD-10-CM | POA: Diagnosis not present

## 2021-08-13 LAB — BASIC METABOLIC PANEL
Anion gap: 8 (ref 5–15)
BUN: 33 mg/dL — ABNORMAL HIGH (ref 8–23)
CO2: 32 mmol/L (ref 22–32)
Calcium: 8.5 mg/dL — ABNORMAL LOW (ref 8.9–10.3)
Chloride: 99 mmol/L (ref 98–111)
Creatinine, Ser: 1.69 mg/dL — ABNORMAL HIGH (ref 0.44–1.00)
GFR, Estimated: 30 mL/min — ABNORMAL LOW (ref >60)
Glucose, Bld: 88 mg/dL (ref 70–99)
Potassium: 3.5 mmol/L (ref 3.5–5.1)
Sodium: 139 mmol/L (ref 135–145)

## 2021-08-13 LAB — CBC
HCT: 28.6 % — ABNORMAL LOW (ref 36.0–46.0)
Hemoglobin: 9.3 g/dL — ABNORMAL LOW (ref 12.0–15.0)
MCH: 29.2 pg (ref 26.0–34.0)
MCHC: 32.5 g/dL (ref 30.0–36.0)
MCV: 89.7 fL (ref 80.0–100.0)
Platelets: 195 10*3/uL (ref 150–400)
RBC: 3.19 MIL/uL — ABNORMAL LOW (ref 3.87–5.11)
RDW: 14.6 % (ref 11.5–15.5)
WBC: 6.7 10*3/uL (ref 4.0–10.5)
nRBC: 0 % (ref 0.0–0.2)

## 2021-08-13 MED ORDER — POTASSIUM CHLORIDE CRYS ER 20 MEQ PO TBCR
40.0000 meq | EXTENDED_RELEASE_TABLET | Freq: Once | ORAL | Status: AC
  Administered 2021-08-13: 40 meq via ORAL
  Filled 2021-08-13: qty 2

## 2021-08-13 MED ORDER — FUROSEMIDE 10 MG/ML IJ SOLN
60.0000 mg | Freq: Three times a day (TID) | INTRAMUSCULAR | Status: DC
Start: 2021-08-13 — End: 2021-08-15
  Administered 2021-08-13 – 2021-08-15 (×6): 60 mg via INTRAVENOUS
  Filled 2021-08-13 (×7): qty 6

## 2021-08-13 NOTE — Assessment & Plan Note (Signed)
Rate controlled with amiodarone. Off anticoagulation due to recent GI bleed ?

## 2021-08-13 NOTE — Progress Notes (Signed)
Mobility Specialist - Progress Note ? ? 08/13/21 1400  ?Mobility  ?Activity Ambulated independently in hallway;Stood at bedside  ?Level of Assistance Standby assist, set-up cues, supervision of patient - no hands on  ?Assistive Device [place]  ?Distance Ambulated (ft) 170 ft  ?Activity Response Tolerated well  ?$Mobility charge 1 Mobility  ? ? ?Pre-mobility: 59 HR, 97% SpO2 ?During mobility: 67 HR, 98% SpO2 ? ?Pt supine in chair upon arrival using RA. Pt completes STS ModI and ambulates with SUP voicing no complaints. Pt returns to chair with needs in reach. ? ?April [name] ?Mobility Specialist ?08/13/21, 2:44 PM ? ? ? ?

## 2021-08-13 NOTE — Plan of Care (Signed)

## 2021-08-13 NOTE — Progress Notes (Signed)
?Progress Note ? ? ?Patient: April [name] DOB: [birth year] DOA: 08/08/2021     5 ?DOS: the patient was seen and examined on 08/13/2021 ?  ?Brief hospital course: ?81 y.o. female with medical history significant for paroxysmal A-fib on Eliquis, HFpEF, coronary artery disease status post PCI of RCA in 2017, essential hypertension, OSA on CPAP, chronic lower extremity edema, CKD 3B, hyperlipidemia, GERD, type 2 diabetes, recently diagnosed erosive gastritis (EGD 07/10/21) on twice daily PPI, who presented to [facility] ED with complaints of gradually worsening shortness of breath of 1 month duration.  Associated with 15 pounds weight gain, and abdominal distention within 2 weeks.  Admitted for acute on chronic CHF exacerbation ? ?4/12: Continue ongoing diuresis per cardiology ?4/13: Hemoglobin 7.6, getting 1 PRBC transfusion.  Potassium 3.0 and being replaced ?4/14: Hemoglobin improved status post 1 PRBC transfusion yesterday.  Ongoing diuresis per cardiology ?4/15: Continue ongoing diuresis ? ? ?Assessment and Plan: ?* Acute on chronic diastolic CHF (congestive heart failure) ([place]) ?EF 55 to 60% on last echo.  She seems markedly volume overloaded and dyspneic at rest. ?Net IO Since Admission: -5,868 mL [08/13/21 1407]  ?Continue IV Lasix for now (increased to every 8 hours per cardiology).  Patient requesting not to discharge her to soon ? ?GI bleeding ?Off anticoagulation due to 2 recent admissions for GI bleed.  No obvious GI bleed.  Patient had undergone colonoscopy on 3/13 showing tubular adenoma, diverticulosis and nonbleeding internal hemorrhoids.  EGD showed mild erythematous gastric mucosa with no obvious bleeding.  Negative H. Pylori. ? ? ?Paroxysmal atrial fibrillation (HCC) ?Rate controlled with amiodarone. Off anticoagulation due to recent GI bleed ? ?Essential hypertension, benign ?Continue amiodarone and Lasix.  Blood pressure stable ? ?Acute renal failure superimposed on stage 3b chronic kidney  disease ([place]) ?Lab Results  ?Component Value Date  ? CREATININE 1.69 (H) 08/13/2021  ? CREATININE 1.68 (H) 08/12/2021  ? CREATININE 1.74 (H) 08/11/2021  ?Monitor renal function while on diuretics.  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 ?Hypokalemia ?Replete and recheck.  Pharmacy managing ? ?Symptomatic anemia ?Hemoglobin 9.3 (7.6) s/p 1 PRBC transfusion.  Patient has undergone upper and lower endoscopy without any conclusive bleeder.  She reports no obvious bleed or melena. ? ?BMI 38.0-38.9,adult ?Morbid obesity with BMI of 40 and hypertension.  Complicates overall prognosis ? ? ? ? ?  ? ?Subjective: Slowly improving.  Requesting not to release her to soon, so as she has to come back to hospital ? ?Physical Exam: ?Vitals:  ? 08/12/21 1747 08/12/21 2253 08/13/21 0818 08/13/21 1202  ?BP: (!) 124/56  (!) 126/43 (!) 109/47  ?Pulse: (!) 52  60 (!) 58  ?Resp: '14 17 20 17  '$ ?Temp: 98.3 ?F (36.8 ?C)  99 ?F (37.2 ?C) 97.9 ?F (36.6 ?C)  ?TempSrc:      ?SpO2: 99% 99% 95% 98%  ?Weight:      ?Height:      ? ?General exam:?Appears calm and comfortable  ?Respiratory system:??Normal work of breathing.  ?Cardiovascular system:?S1-S2, tachycardic, regular rhythm, no murmurs, marked pitting edema BLE ?Gastrointestinal system:?Obese, NT/ND, normal bowel sounds ?Central nervous system:?Alert and oriented. No focal neurological deficits. ?Extremities: Symmetric 5 x 5 power. ?Skin:?Pallor present, no rashes, lesions or ulcers ?Psychiatry:?Judgement and insight appear normal. Mood &?affect appropriate.? ?? ?Data Reviewed: ? ?There are no new results to review at this time. ? ?Family Communication: None today ? ?Disposition: ?Status is: Inpatient ?Remains inpatient appropriate because: Ongoing diuresis ? ? Planned Discharge Destination:  Home with Home Health ? ? ? DVT prophylaxis-SCDs ?Time spent: 35 minutes ? ?Author: Max Sane, MD ?08/13/2021 2:08 PM ? ?For on call review [email or web address].  ?

## 2021-08-13 NOTE — Assessment & Plan Note (Signed)
Lab Results  ?Component Value Date  ? CREATININE 1.69 (H) 08/13/2021  ? CREATININE 1.68 (H) 08/12/2021  ? CREATININE 1.74 (H) 08/11/2021  ?Monitor renal function while on diuretics.  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

## 2021-08-13 NOTE — Assessment & Plan Note (Signed)
Morbid obesity with BMI of 40 and hypertension.  Complicates overall prognosis ?

## 2021-08-13 NOTE — Assessment & Plan Note (Signed)
Continue amiodarone and Lasix.  Blood pressure stable ?

## 2021-08-13 NOTE — Progress Notes (Signed)
? ?Cardiology Progress Note  ? ?Patient Name: April [name] ?Date of Encounter: 08/13/2021 ? ?Primary Cardiologist: Ida Rogue, MD ? ?Subjective  ? ?Feels well this AM.  Still with mild, woody lower ext edema to the mid-posterior thighs.  Wt pending this AM. ? ?Inpatient Medications  ?  ?Scheduled Meds: ? acidophilus  2 capsule Oral Daily  ? amiodarone  200 mg Oral Daily  ? cholecalciferol  1,000 Units Oral Daily  ? furosemide  60 mg Intravenous BID  ? mometasone-formoterol  2 puff Inhalation BID  ? pantoprazole  40 mg Oral BID  ? polyvinyl alcohol  1 drop Both Eyes Daily  ? potassium chloride  40 mEq Oral BID  ? rosuvastatin  40 mg Oral Daily  ? ?Continuous Infusions: ? ?PRN Meds: ?acetaminophen, albuterol, loperamide  ? ?Vital Signs  ?  ?Vitals:  ? 08/12/21 1245 08/12/21 1524 08/12/21 1747 08/12/21 2253  ?BP: (!) 110/55 (!) 106/45 (!) 124/56   ?Pulse: (!) 56 (!) 56 (!) 52   ?Resp: '16 16 14 17  '$ ?Temp: 98.1 ?F (36.7 ?C) 98.1 ?F (36.7 ?C) 98.3 ?F (36.8 ?C)   ?TempSrc:      ?SpO2: 99% 98% 99% 99%  ?Weight:      ?Height:      ? ? ?Intake/Output Summary (Last 24 hours) at 08/13/2021 0741 ?Last data filed at 08/13/2021 0547 ?Gross per 24 hour  ?Intake 240 ml  ?Output 1550 ml  ?Net -1310 ml  ? ?Filed Weights  ? 08/10/21 0533 08/11/21 0800 08/12/21 0500  ?Weight: 93 kg 90.9 kg 87.6 kg  ? ? ?Physical Exam  ? ?GEN: Well nourished, well developed, in no acute distress.  ?HEENT: Grossly normal.  ?Neck: Supple, obese- difficult to gauge jvp, no carotid bruits, or masses. ?Cardiac: RRR, distant, no murmurs, rubs, or gallops. No clubbing, cyanosis, 1+ bilat LE woody edema to the mid-posterior thighs - much improved over past 3 days.  Radials 2+, DP/PT 1+ and equal bilaterally.  ?Respiratory:  Respirations regular and unlabored, clear to auscultation bilaterally. ?GI: Soft, nontender, nondistended, BS + x 4. ?MS: no deformity or atrophy. ?Skin: warm and dry, no rash. ?Neuro:  Strength and sensation are intact. ?Psych: AAOx3.   Normal affect. ? ?Labs  ?  ?Chemistry ?Recent Labs  ?Lab 08/09/21 ?0430 08/10/21 ?9147 08/11/21 ?8295 08/12/21 ?6213 08/13/21 ?0458  ?NA 138   < > 137 139 139  ?K 3.6   < > 3.0* 3.0* 3.5  ?CL 105   < > 104 100 99  ?CO2 24   < > 26 31 32  ?GLUCOSE 110*   < > 96 98 88  ?BUN 29*   < > 36* 32* 33*  ?CREATININE 1.79*   < > 1.74* 1.68* 1.69*  ?CALCIUM 8.2*   < > 8.1* 8.4* 8.5*  ?PROT 5.5*  --   --   --   --   ?ALBUMIN 2.4*  --   --   --   --   ?AST 47*  --   --   --   --   ?ALT 25  --   --   --   --   ?ALKPHOS 75  --   --   --   --   ?BILITOT 0.7  --   --   --   --   ?GFRNONAA 28*   < > 29* 30* 30*  ?ANIONGAP 9   < > '7 8 8  '$ ? < > = values in  this interval not displayed.  ?  ? ?Hematology ?Recent Labs  ?Lab 08/11/21 ?6812 08/11/21 ?2158 08/12/21 ?7517 08/13/21 ?0458  ?WBC 6.7  --  6.0 6.7  ?RBC 2.56*  --  3.14* 3.19*  ?HGB 7.6* 10.4* 9.3* 9.3*  ?HCT 23.2* 32.3* 28.3* 28.6*  ?MCV 90.6  --  90.1 89.7  ?MCH 29.7  --  29.6 29.2  ?MCHC 32.8  --  32.9 32.5  ?RDW 14.6  --  14.6 14.6  ?PLT 194  --  188 195  ? ? ?Cardiac Enzymes  ?Recent Labs  ?Lab 08/08/21 ?1745 08/08/21 ?1934  ?TROPONINIHS 15 16  ?   ? ?BNP ?   ?Component Value Date/Time  ? BNP 1,032.5 (H) 08/08/2021 1745  ? ?Lipids  ?Lab Results  ?Component Value Date  ? CHOL 129 01/20/2021  ? HDL 50.90 01/20/2021  ? [place] 62 01/20/2021  ? TRIG 80.0 01/20/2021  ? CHOLHDL 3 01/20/2021  ? ? ?HbA1c  ?Lab Results  ?Component Value Date  ? HGBA1C 6.2 01/20/2021  ? ? ?Radiology  ?  ?------------------ ? ?Telemetry  ?  ?Sinus brady, 50's, 1st deg avb - Personally Reviewed ? ?Cardiac Studies  ? ?2D Echocardiogram 04.13.2023 ?  ?1. Left ventricular ejection fraction, by estimation, is 60 to 65%. The  ?left ventricle has normal function. The left ventricle has no regional  ?wall motion abnormalities. Left ventricular diastolic parameters are  ?consistent with Grade II diastolic  ?dysfunction (pseudonormalization).  ? 2. Right ventricular systolic function is normal. The right  ventricular  ?size is mildly enlarged. There is mildly elevated pulmonary artery  ?systolic pressure. The estimated right ventricular systolic pressure is  ?00.1 mmHg.  ? 3. Left atrial size was mildly dilated.  ? 4. Right atrial size was mildly dilated.  ? 5. The mitral valve is normal in structure. Mild mitral valve  ?regurgitation.  ? 6. Tricuspid valve regurgitation is moderate.  ? 7. The aortic valve is grossly normal. Aortic valve regurgitation is not  ?visualized.  ? 8. The inferior vena cava is normal in size with greater than 50%  ?respiratory variability, suggesting right atrial pressure of 3 mmHg.  ? ?Patient Profile  ?   ?81 y.o. female with a hx of CAD s/p prior RCA and pDA stenting, HFpEF, PAF on Eliquis, anemia with recurrent GI bleeding, CKD stage III, HTN, HLD, and sleep apnea, who was admitted 4/10 w/ ongoing lower ext edema/volume overload, and wt gain. ? ?Assessment & Plan  ?  ?1.  Acute on chronic HFpEF: Patient with significant weight gain and volume overload following 2 separate admissions for GI bleed and acute kidney injury resulting in holding of home diuretics with fluid resuscitation and packed red blood cells.  EF 60-65% w/ GrII DD by echo this admission.  Provided metolazone 4/12 w/ good response and good diuresis/stable renal fxn since then.  Minus 1.3L yesterday and minus 4.9L since admission.  Dry wt ~ 85 kg (was 87.6 kg yesterday, pending this AM).  Still w/ 1+ bilat LE woody edema to mid-posterior thighs - this bothers her when she bends her legs.  Renal fxn relatively stable.  Cont IV diuresis today.  Add TEDS.  Suspect we can transition to oral diuretic tomorrow.  Was most recently taking lasix 40 BID but prior to that had been stable for awhile on 40 daily alternating w/ 40 twice daily every other day.  Suspect she'd do well w/ prior home dose as current volume excess the result of  anemia and GIB w/ AKI resulting in holding of diuretics w/ aggressive IVF hydration and  PRBCs. ? ?2.  PAF:  Maintaining sinus rhythm on amio.  Eliquis d/c'd after 2 recent admissions for GIB. ? ?3.  Essential HTN:  stable. ? ?4.  GIB/Anemia:  s/p 1 unit prbcs on 4/13.  H/H stable. Now off of asa and eliquis. ? ?5.  CAD:  s/p prior inflat STEMI in 2017 w/ PCI of the RCA and RPDA.  No c/p.  Cont statin.  No ASA in setting of recent GIB/ongoing anemia s/p 1u prbc's this admission. ?  ?6.  HL:  LDL 62 in 12/2020.  Cont statin rx. ?  ?7.  CKD III:  BUN/creat relatively stable, though bumping slightly.  Cont IV diuresis today - suspect we'll be able to transition to PO diuretic tomorrow.  ?  ?8.  Hypokalemia:  in setting of IV diuresis/metolazone dosing on 4/12.  K 3.0 this AM.  Kdur 40 bid added. ? ?Signed, ?Murray Hodgkins, NP  ?08/13/2021, 7:41 AM   ? ?For questions or updates, please contact   ?Please consult [email or web address] for contact info under Cardiology/STEMI.  ?

## 2021-08-13 NOTE — Assessment & Plan Note (Signed)
Replete and recheck.  Pharmacy managing ?

## 2021-08-13 NOTE — Consult Note (Signed)
PHARMACY CONSULT NOTE - FOLLOW UP ? ?Pharmacy Consult for Electrolyte Monitoring and Replacement  ? ?Recent Labs: ?Potassium (mmol/L)  ?Date Value  ?08/13/2021 3.5  ?03/23/2014 3.9  ? ?Magnesium (mg/dL)  ?Date Value  ?08/11/2021 2.2  ? ?Calcium (mg/dL)  ?Date Value  ?08/13/2021 8.5 (L)  ? ?Albumin (g/dL)  ?Date Value  ?08/09/2021 2.4 (L)  ? ?Phosphorus (mg/dL)  ?Date Value  ?08/09/2021 4.0  ? ?Sodium (mmol/L)  ?Date Value  ?08/13/2021 139  ?04/29/2018 147 (H)  ? ? ? ?Assessment: ?81 y.o. female with medical history significant for paroxysmal A-fib on Eliquis, HFpEF, coronary artery disease status post PCI of RCA in 2017, essential hypertension, OSA on CPAP, chronic lower extremity edema, CKD 3B, hyperlipidemia, GERD, type 2 diabetes, recently diagnosed erosive gastritis (EGD 07/10/21) on twice daily PPI, who presented to [facility] ED with complaints of gradually worsening shortness of breath of 1 month duration.  ? ?Currently on lasix 60 mg IV BID, and amiodarone 200 mg daily,  ? ?Goal of Therapy:  ?K+ > 4 ?Mg > 2 ? ?Plan:  ?Continue KCL 40 mEq BID.  ?Will give an additional K+ 40 mEq at lunch time.  ?F/u with AM labs.  ?Per cardiology progress note, plan to to transition to PO diuretic therapy 08/14/21 ? ?Dorothe Pea, PharmD, BCPS ?Clinical Pharmacist   ?08/13/2021 9:38 AM ? ?

## 2021-08-13 NOTE — Assessment & Plan Note (Signed)
Off anticoagulation due to 2 recent admissions for GI bleed.  No obvious GI bleed.  Patient had undergone colonoscopy on 3/13 showing tubular adenoma, diverticulosis and nonbleeding internal hemorrhoids.  EGD showed mild erythematous gastric mucosa with no obvious bleeding.  Negative H. Pylori. ? ?

## 2021-08-13 NOTE — Assessment & Plan Note (Signed)
Hemoglobin 9.3 (7.6) s/p 1 PRBC transfusion.  Patient has undergone upper and lower endoscopy without any conclusive bleeder.  She reports no obvious bleed or melena. ?

## 2021-08-13 NOTE — Assessment & Plan Note (Signed)
EF 55 to 60% on last echo.  She seems markedly volume overloaded and dyspneic at rest. ?Net IO Since Admission: -5,868 mL [08/13/21 1407]  ?Continue IV Lasix for now (increased to every 8 hours per cardiology).  Patient requesting not to discharge her to soon ?

## 2021-08-14 DIAGNOSIS — K922 Gastrointestinal hemorrhage, unspecified: Secondary | ICD-10-CM | POA: Diagnosis not present

## 2021-08-14 DIAGNOSIS — N170 Acute kidney failure with tubular necrosis: Secondary | ICD-10-CM

## 2021-08-14 DIAGNOSIS — I480 Paroxysmal atrial fibrillation: Secondary | ICD-10-CM | POA: Diagnosis not present

## 2021-08-14 DIAGNOSIS — I5033 Acute on chronic diastolic (congestive) heart failure: Secondary | ICD-10-CM | POA: Diagnosis not present

## 2021-08-14 DIAGNOSIS — I890 Lymphedema, not elsewhere classified: Secondary | ICD-10-CM

## 2021-08-14 DIAGNOSIS — I10 Essential (primary) hypertension: Secondary | ICD-10-CM | POA: Diagnosis not present

## 2021-08-14 LAB — CBC
HCT: 28.1 % — ABNORMAL LOW (ref 36.0–46.0)
Hemoglobin: 8.9 g/dL — ABNORMAL LOW (ref 12.0–15.0)
MCH: 29.4 pg (ref 26.0–34.0)
MCHC: 31.7 g/dL (ref 30.0–36.0)
MCV: 92.7 fL (ref 80.0–100.0)
Platelets: 186 10*3/uL (ref 150–400)
RBC: 3.03 MIL/uL — ABNORMAL LOW (ref 3.87–5.11)
RDW: 14.6 % (ref 11.5–15.5)
WBC: 6.8 10*3/uL (ref 4.0–10.5)
nRBC: 0 % (ref 0.0–0.2)

## 2021-08-14 LAB — BASIC METABOLIC PANEL
Anion gap: 8 (ref 5–15)
BUN: 32 mg/dL — ABNORMAL HIGH (ref 8–23)
CO2: 32 mmol/L (ref 22–32)
Calcium: 8.3 mg/dL — ABNORMAL LOW (ref 8.9–10.3)
Chloride: 99 mmol/L (ref 98–111)
Creatinine, Ser: 1.65 mg/dL — ABNORMAL HIGH (ref 0.44–1.00)
GFR, Estimated: 31 mL/min — ABNORMAL LOW (ref >60)
Glucose, Bld: 101 mg/dL — ABNORMAL HIGH (ref 70–99)
Potassium: 4 mmol/L (ref 3.5–5.1)
Sodium: 139 mmol/L (ref 135–145)

## 2021-08-14 MED ORDER — POTASSIUM CHLORIDE CRYS ER 20 MEQ PO TBCR
40.0000 meq | EXTENDED_RELEASE_TABLET | Freq: Once | ORAL | Status: AC
  Administered 2021-08-14: 40 meq via ORAL
  Filled 2021-08-14: qty 2

## 2021-08-14 NOTE — Assessment & Plan Note (Signed)
Hemoglobin 9.3 (7.6) s/p 1 PRBC transfusion.  Patient has undergone upper and lower endoscopy without any conclusive bleeder.  She reports no obvious bleed or melena ?

## 2021-08-14 NOTE — Progress Notes (Signed)
?Progress Note ? ? ?Patient: April [name] BHA:[id number] DOB: 16-Feb-1941 DOA: 08/08/2021     6 ?DOS: the patient was seen and examined on 08/14/2021 ?  ?Brief hospital course: ?81 y.o. female with medical history significant for paroxysmal A-fib on Eliquis, HFpEF, coronary artery disease status post PCI of RCA in 2017, essential hypertension, OSA on CPAP, chronic lower extremity edema, CKD 3B, hyperlipidemia, GERD, type 2 diabetes, recently diagnosed erosive gastritis (EGD 07/10/21) on twice daily PPI, who presented to [facility] ED with complaints of gradually worsening shortness of breath of 1 month duration.  Associated with 15 pounds weight gain, and abdominal distention within 2 weeks.  Admitted for acute on chronic CHF exacerbation ? ?4/12: Continue ongoing diuresis per cardiology ?4/13: Hemoglobin 7.6, getting 1 PRBC transfusion.  Potassium 3.0 and being replaced ?4/14: Hemoglobin improved status post 1 PRBC transfusion yesterday.  Ongoing diuresis per cardiology ?4/15-4/16: Continue ongoing diuresis ? ? ?Assessment and Plan: ?* Acute on chronic diastolic CHF (congestive heart failure) ([place]) ?EF 55 to 60% on last echo.  She seems markedly volume overloaded and dyspneic at rest. ?Net IO Since Admission: -7,879 mL [08/14/21 1119]  ?Continue IV Lasix every 8 hours per cardiology.  Patient requesting not to discharge her too soon ? ?GI bleeding ?Off anticoagulation due to 2 recent admissions for GI bleed.  No obvious GI bleed.  Patient had undergone colonoscopy on 3/13 showing tubular adenoma, diverticulosis and nonbleeding internal hemorrhoids.  EGD showed mild erythematous gastric mucosa with no obvious bleeding.  Negative H. Pylori. ? ? ?Paroxysmal atrial fibrillation (HCC) ?Rate controlled with amiodarone. Off anticoagulation due to recent GI bleed. ? ?Essential hypertension, benign ?Continue amiodarone and Lasix.  Blood pressure stable ? ?Acute renal failure superimposed on stage 3b chronic kidney disease  ([place]) ?Lab Results  ?Component Value Date  ? CREATININE 1.65 (H) 08/14/2021  ? CREATININE 1.69 (H) 08/13/2021  ? CREATININE 1.68 (H) 08/12/2021  ?Monitor renal function while on diuretics.  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 ?Hypokalemia ?Replete and recheck.  Pharmacy managing ? ?Symptomatic anemia ?Hemoglobin 9.3 (7.6) s/p 1 PRBC transfusion.  Patient has undergone upper and lower endoscopy without any conclusive bleeder.  She reports no obvious bleed or melena ? ?BMI 38.0-38.9,adult ?Morbid obesity with BMI of 40 and hypertension.  Complicates overall prognosis ? ? ? ? ?  ? ?Subjective: Sitting in the chair.  Feeling better ? ?Physical Exam: ?Vitals:  ? 08/13/21 2324 08/13/21 2352 08/14/21 0428 08/14/21 0752  ?BP:  (!) 146/51 110/63 (!) 108/43  ?Pulse:  60 (!) 59 61  ?Resp: '19 18 18 18  '$ ?Temp:  98.4 ?F (36.9 ?C) 98 ?F (36.7 ?C) 99.3 ?F (37.4 ?C)  ?TempSrc:  Oral    ?SpO2:  97% 99% 93%  ?Weight:   91.5 kg   ?Height:      ? ?General exam:?Appears calm and comfortable  ?Respiratory system:??Normal work of breathing.  ?Cardiovascular system:?S1-S2, tachycardic, regular rhythm, no murmurs, marked pitting edema BLE ?Gastrointestinal system:?Obese, NT/ND, normal bowel sounds ?Central nervous system:?Alert and oriented. No focal neurological deficits. ?Extremities: Symmetric 5 x 5 power. ?Skin:?Pallor present, no rashes, lesions or ulcers ?Psychiatry:?Judgement and insight appear normal. Mood &?affect appropriate. ? ?Data Reviewed: ? ?There are no new results to review at this time. ? ?Family Communication: Son updated over phone.   ? ?Disposition: ?Status is: Inpatient ?Remains inpatient appropriate because: Ongoing diuresis ? ? Planned Discharge Destination: Home with Home Health ? ? ? DVT prophylaxis-SCDs ?Time spent: 35 minutes ? ?  Author: Max Sane, MD ?08/14/2021 11:20 AM ? ?For on call review [email or web address].

## 2021-08-14 NOTE — Consult Note (Signed)
PHARMACY CONSULT NOTE - FOLLOW UP ? ?Pharmacy Consult for Electrolyte Monitoring and Replacement  ? ?Recent Labs: ?Potassium (mmol/L)  ?Date Value  ?08/14/2021 4.0  ?03/23/2014 3.9  ? ?Magnesium (mg/dL)  ?Date Value  ?08/11/2021 2.2  ? ?Calcium (mg/dL)  ?Date Value  ?08/14/2021 8.3 (L)  ? ?Albumin (g/dL)  ?Date Value  ?08/09/2021 2.4 (L)  ? ?Phosphorus (mg/dL)  ?Date Value  ?08/09/2021 4.0  ? ?Sodium (mmol/L)  ?Date Value  ?08/14/2021 139  ?04/29/2018 147 (H)  ? ? ? ?Assessment: ?81 y.o. female with medical history significant for paroxysmal A-fib on Eliquis, HFpEF, coronary artery disease status post PCI of RCA in 2017, essential hypertension, OSA on CPAP, chronic lower extremity edema, CKD 3B, hyperlipidemia, GERD, type 2 diabetes, recently diagnosed erosive gastritis (EGD 07/10/21) on twice daily PPI, who presented to [facility] ED with complaints of gradually worsening shortness of breath of 1 month duration.  ? ?Currently on lasix 60 mg IV TID, and amiodarone 200 mg daily,  ? ?Goal of Therapy:  ?K+ > 4 ?Mg > 2 ? ?Plan:  ?Continue KCL 40 mEq BID.  ?Will give an additional K+ 40 mEq at lunch time.  ?F/u with AM labs.  ? ? ?Pearla Dubonnet, PharmD ?Clinical Pharmacist ?08/14/2021 ?7:37 AM ? ? ?

## 2021-08-14 NOTE — Assessment & Plan Note (Signed)
Continue amiodarone and Lasix.  Blood pressure stable ?

## 2021-08-14 NOTE — Progress Notes (Signed)
? ?Progress Note ? ?Patient Name: April [name] ?Date of Encounter: 08/14/2021 ? ?[facility] Cardiologist: Ida Rogue, MD  ? ?Subjective  ? ?IV Lasix increased yesterday up to every 8 hours ?-1.9 L past 24 hours ?-8.2 L so far today 8 ?Reports still having leg swelling extending up the back of her legs behind the knees.  Shortness of breath improving ?Wore compression hose yesterday ?Reports that she has lymphedema compression pumps at home ? ?Inpatient Medications  ?  ?Scheduled Meds: ? acidophilus  2 capsule Oral Daily  ? amiodarone  200 mg Oral Daily  ? cholecalciferol  1,000 Units Oral Daily  ? furosemide  60 mg Intravenous Q8H  ? mometasone-formoterol  2 puff Inhalation BID  ? pantoprazole  40 mg Oral BID  ? polyvinyl alcohol  1 drop Both Eyes Daily  ? potassium chloride  40 mEq Oral BID  ? potassium chloride  40 mEq Oral Once  ? rosuvastatin  40 mg Oral Daily  ? ?Continuous Infusions: ? ?PRN Meds: ?acetaminophen, albuterol, loperamide  ? ?Vital Signs  ?  ?Vitals:  ? 08/13/21 2352 08/14/21 0428 08/14/21 0752 08/14/21 1135  ?BP: (!) 146/51 110/63 (!) 108/43 (!) 112/45  ?Pulse: 60 (!) 59 61 62  ?Resp: '18 18 18 18  '$ ?Temp: 98.4 ?F (36.9 ?C) 98 ?F (36.7 ?C) 99.3 ?F (37.4 ?C) 98.2 ?F (36.8 ?C)  ?TempSrc: Oral     ?SpO2: 97% 99% 93% 97%  ?Weight:  91.5 kg    ?Height:      ? ? ?Intake/Output Summary (Last 24 hours) at 08/14/2021 1229 ?Last data filed at 08/14/2021 1137 ?Gross per 24 hour  ?Intake 240 ml  ?Output 2601 ml  ?Net -2361 ml  ? ? ?  08/14/2021  ?  4:28 AM 08/12/2021  ?  5:00 AM 08/11/2021  ?  8:00 AM  ?Last 3 Weights  ?Weight (lbs) 201 lb 11.5 oz 193 lb 1.6 oz 200 lb 8 oz  ?Weight (kg) 91.5 kg 87.59 kg 90.946 kg  ?   ? ?Telemetry  ?  ?Normal sinus rhythm- Personally Reviewed ? ?ECG  ?  ?- Personally Reviewed ? ?Physical Exam  ? ?Constitutional:  oriented to person, place, and time. No distress.  ?HENT:  ?Head: Grossly normal ?Eyes:  no discharge. No scleral icterus.  ?Neck: No JVD, no carotid bruits   ?Cardiovascular: Regular rate and rhythm, no murmurs appreciated ?1+ pitting woody edema bilaterally ?Pulmonary/Chest: Clear to auscultation bilaterally, no wheezes or rales ?Abdominal: Soft.  no distension.  no tenderness.  ?Musculoskeletal: Normal range of motion ?Neurological:  normal muscle tone. Coordination normal. No atrophy ?Skin: Skin warm and dry ?Psychiatric: normal affect, pleasant ? ?Labs  ?  ?High Sensitivity Troponin:   ?Recent Labs  ?Lab 08/08/21 ?1745 08/08/21 ?1934  ?TROPONINIHS 15 16  ?   ?Chemistry ?Recent Labs  ?Lab 08/09/21 ?0430 08/10/21 ?7782 08/11/21 ?4235 08/12/21 ?3614 08/13/21 ?4315 08/14/21 ?0431  ?NA 138   < > 137 139 139 139  ?K 3.6   < > 3.0* 3.0* 3.5 4.0  ?CL 105   < > 104 100 99 99  ?CO2 24   < > 26 31 32 32  ?GLUCOSE 110*   < > 96 98 88 101*  ?BUN 29*   < > 36* 32* 33* 32*  ?CREATININE 1.79*   < > 1.74* 1.68* 1.69* 1.65*  ?CALCIUM 8.2*   < > 8.1* 8.4* 8.5* 8.3*  ?MG 2.2  --  2.2  --   --   --   ?  PROT 5.5*  --   --   --   --   --   ?ALBUMIN 2.4*  --   --   --   --   --   ?AST 47*  --   --   --   --   --   ?ALT 25  --   --   --   --   --   ?ALKPHOS 75  --   --   --   --   --   ?BILITOT 0.7  --   --   --   --   --   ?GFRNONAA 28*   < > 29* 30* 30* 31*  ?ANIONGAP 9   < > '7 8 8 8  '$ ? < > = values in this interval not displayed.  ?  ?Lipids No results for input(s): CHOL, TRIG, HDL, LABVLDL, LDLCALC, CHOLHDL in the last 168 hours.  ?Hematology ?Recent Labs  ?Lab 08/12/21 ?3557 08/13/21 ?0458 08/14/21 ?0431  ?WBC 6.0 6.7 6.8  ?RBC 3.14* 3.19* 3.03*  ?HGB 9.3* 9.3* 8.9*  ?HCT 28.3* 28.6* 28.1*  ?MCV 90.1 89.7 92.7  ?MCH 29.6 29.2 29.4  ?MCHC 32.9 32.5 31.7  ?RDW 14.6 14.6 14.6  ?PLT 188 195 186  ? ?Thyroid No results for input(s): TSH, FREET4 in the last 168 hours.  ?BNP ?Recent Labs  ?Lab 08/08/21 ?1745  ?BNP 1,032.5*  ?  ?DDimer No results for input(s): DDIMER in the last 168 hours.  ? ?Radiology  ?  ?No results found. ? ?Cardiac Studies  ? ? ? ?Patient Profile  ?   ? 81 y.o. female with  a hx of CAD s/p prior RCA and pDA stenting, HFpEF, PAF on Eliquis, anemia with recurrent GI bleeding, CKD stage III, HTN, HLD, and sleep apnea, who was admitted 4/10 w/ ongoing lower ext edema/volume overload, and wt gain. ? ?Assessment & Plan  ?  ?Acute on chronic diastolic CHF ?Ejection fraction 60 to 32%, grade 2 diastolic dysfunction, moderately elevated right heart pressures ?-Symptoms exacerbated by recent GI bleed, fluid resuscitation, anemia ?-Appears to be responding well to Lasix IV every 8 hours, will keep this regiment going with close monitoring of renal function.  For climbing BUN and creatinine may need to wean off IV Lasix and transition to torsemide40 in the morning 20 in the afternoon with close monitoring of renal function (was previously on Lasix 40 twice daily alternating with 40 daily) ?  ?Anemia/GI bleed ?Hemoglobin improved after transfusion x1, Slow trend downward ?Anticoagulation on hold in setting of GI bleed, symptomatic anemia ?  ?Coronary artery disease with chronic stable angina ?Prior STEMI 2017 history of PCI ?Aspirin held in the setting of bleed, anemia ?Denies angina, no ischemic work-up needed at this time ?  ?Chronic kidney disease stage III ?Consistent with chronic renal dysfunction and component of cardiorenal syndrome, 1.9 now down to 1.65, with continued trend downward with diuresis ?  ?Essential hypertension ?Blood pressure is well controlled on today's visit. No changes made to the medications. ? ?Paroxysmal atrial fibrillation ?Maintaining normal sinus rhythm, on amiodarone ?Anticoagulation on hold ?Not on beta-blocker secondary to bradycardia ? ?Leg edema ?Continue IV Lasix as above, likely component of lymphedema contributing to symptoms as well as exacerbation from underlying anemia ? ?Discussed with patient in detail, hospitalist service ? Total encounter time more than 50 minutes ? Greater than 50% was spent in counseling and coordination of care with the  patient ? ? ?For questions or updates,  please contact [place] ?Please consult [email or web address] for contact info under  ? ?  ?   ?Signed, ?Ida Rogue, MD  ?08/14/2021, 12:29 PM    ?

## 2021-08-14 NOTE — Assessment & Plan Note (Signed)
Rate controlled with amiodarone. Off anticoagulation due to recent GI bleed. ?

## 2021-08-14 NOTE — Assessment & Plan Note (Signed)
Replete and recheck.  Pharmacy managing ?

## 2021-08-14 NOTE — Assessment & Plan Note (Signed)
Morbid obesity with BMI of 40 and hypertension.  Complicates overall prognosis ?

## 2021-08-14 NOTE — Assessment & Plan Note (Signed)
Lab Results  ?Component Value Date  ? CREATININE 1.65 (H) 08/14/2021  ? CREATININE 1.69 (H) 08/13/2021  ? CREATININE 1.68 (H) 08/12/2021  ?Monitor renal function while on diuretics.  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

## 2021-08-14 NOTE — Assessment & Plan Note (Signed)
Off anticoagulation due to 2 recent admissions for GI bleed.  No obvious GI bleed.  Patient had undergone colonoscopy on 3/13 showing tubular adenoma, diverticulosis and nonbleeding internal hemorrhoids.  EGD showed mild erythematous gastric mucosa with no obvious bleeding.  Negative H. Pylori. ? ?

## 2021-08-14 NOTE — Assessment & Plan Note (Addendum)
EF 55 to 60% on last echo.  She seems markedly volume overloaded and dyspneic at rest. ?Net IO Since Admission: -7,879 mL [08/14/21 1119]  ?Continue IV Lasix every 8 hours per cardiology.  Patient requesting not to discharge her too soon ?

## 2021-08-15 ENCOUNTER — Ambulatory Visit: Payer: Medicare Other | Admitting: Physician Assistant

## 2021-08-15 DIAGNOSIS — I480 Paroxysmal atrial fibrillation: Secondary | ICD-10-CM | POA: Diagnosis not present

## 2021-08-15 DIAGNOSIS — I10 Essential (primary) hypertension: Secondary | ICD-10-CM | POA: Diagnosis not present

## 2021-08-15 DIAGNOSIS — I5033 Acute on chronic diastolic (congestive) heart failure: Secondary | ICD-10-CM | POA: Diagnosis not present

## 2021-08-15 DIAGNOSIS — K922 Gastrointestinal hemorrhage, unspecified: Secondary | ICD-10-CM | POA: Diagnosis not present

## 2021-08-15 LAB — BASIC METABOLIC PANEL
Anion gap: 8 (ref 5–15)
BUN: 31 mg/dL — ABNORMAL HIGH (ref 8–23)
CO2: 31 mmol/L (ref 22–32)
Calcium: 8.5 mg/dL — ABNORMAL LOW (ref 8.9–10.3)
Chloride: 102 mmol/L (ref 98–111)
Creatinine, Ser: 1.4 mg/dL — ABNORMAL HIGH (ref 0.44–1.00)
GFR, Estimated: 38 mL/min — ABNORMAL LOW (ref >60)
Glucose, Bld: 92 mg/dL (ref 70–99)
Potassium: 3.6 mmol/L (ref 3.5–5.1)
Sodium: 141 mmol/L (ref 135–145)

## 2021-08-15 LAB — CBC
HCT: 31.7 % — ABNORMAL LOW (ref 36.0–46.0)
Hemoglobin: 10.1 g/dL — ABNORMAL LOW (ref 12.0–15.0)
MCH: 29.3 pg (ref 26.0–34.0)
MCHC: 31.9 g/dL (ref 30.0–36.0)
MCV: 91.9 fL (ref 80.0–100.0)
Platelets: 195 10*3/uL (ref 150–400)
RBC: 3.45 MIL/uL — ABNORMAL LOW (ref 3.87–5.11)
RDW: 14.6 % (ref 11.5–15.5)
WBC: 7.4 10*3/uL (ref 4.0–10.5)
nRBC: 0 % (ref 0.0–0.2)

## 2021-08-15 LAB — MAGNESIUM: Magnesium: 2.1 mg/dL (ref 1.7–2.4)

## 2021-08-15 LAB — PHOSPHORUS: Phosphorus: 3.7 mg/dL (ref 2.5–4.6)

## 2021-08-15 MED ORDER — POTASSIUM CHLORIDE CRYS ER 20 MEQ PO TBCR
40.0000 meq | EXTENDED_RELEASE_TABLET | Freq: Once | ORAL | Status: AC
  Administered 2021-08-15: 40 meq via ORAL
  Filled 2021-08-15: qty 2

## 2021-08-15 MED ORDER — HYDROCODONE-ACETAMINOPHEN 7.5-325 MG PO TABS
1.0000 | ORAL_TABLET | Freq: Four times a day (QID) | ORAL | Status: DC | PRN
Start: 2021-08-15 — End: 2021-08-16
  Administered 2021-08-15 – 2021-08-16 (×3): 1 via ORAL
  Filled 2021-08-15 (×3): qty 1

## 2021-08-15 MED ORDER — TORSEMIDE 20 MG PO TABS
40.0000 mg | ORAL_TABLET | Freq: Every morning | ORAL | Status: DC
  Administered 2021-08-16: 40 mg via ORAL
  Filled 2021-08-15: qty 2

## 2021-08-15 MED ORDER — TORSEMIDE 20 MG PO TABS
20.0000 mg | ORAL_TABLET | Freq: Every evening | ORAL | Status: DC
  Administered 2021-08-15: 20 mg via ORAL
  Filled 2021-08-15: qty 1

## 2021-08-15 NOTE — Assessment & Plan Note (Signed)
Continue amiodarone and Lasix.  Blood pressure stable ?

## 2021-08-15 NOTE — Progress Notes (Signed)
?Progress Note ? ? ?Patient: April [name] ASN:[id number] DOB: 03-04-1941 DOA: 08/08/2021     7 ?DOS: the patient was seen and examined on 08/15/2021 ?  ?Brief hospital course: ?81 y.o. female with medical history significant for paroxysmal A-fib on Eliquis, HFpEF, coronary artery disease status post PCI of RCA in 2017, essential hypertension, OSA on CPAP, chronic lower extremity edema, CKD 3B, hyperlipidemia, GERD, type 2 diabetes, recently diagnosed erosive gastritis (EGD 07/10/21) on twice daily PPI, who presented to [facility] ED with complaints of gradually worsening shortness of breath of 1 month duration.  Associated with 15 pounds weight gain, and abdominal distention within 2 weeks.  Admitted for acute on chronic CHF exacerbation ? ?4/12: Continue ongoing diuresis per cardiology ?4/13: Hemoglobin 7.6, getting 1 PRBC transfusion.  Potassium 3.0 and being replaced ?4/14: Hemoglobin improved status post 1 PRBC transfusion yesterday.  Ongoing diuresis per cardiology ?4/15-4/17: Continue ongoing diuresis ? ? ?Assessment and Plan: ?* Acute on chronic diastolic CHF (congestive heart failure) (April [name]) ?EF 55 to 60% on last echo.  She seems markedly volume overloaded and dyspneic at rest. ?Net IO Since Admission: -10,909 mL [08/15/21 1751]  ?Continue IV Lasix every 8 hours per cardiology.  Patient requesting not to discharge her too soon ? ?GI bleeding ?Off anticoagulation due to 2 recent admissions for GI bleed.  No obvious GI bleed.  Patient had undergone colonoscopy on 3/13 showing tubular adenoma, diverticulosis and nonbleeding internal hemorrhoids.  EGD showed mild erythematous gastric mucosa with no obvious bleeding.  Negative H. Pylori. ? ? ?Paroxysmal atrial fibrillation (HCC) ?Rate controlled with amiodarone. Off anticoagulation due to recent GI bleed. ? ?Essential hypertension, benign ?Continue amiodarone and Lasix.  Blood pressure stable ? ?Acute renal failure superimposed on stage 3b chronic kidney disease  (April [name]) ?Lab Results  ?Component Value Date  ? CREATININE 1.40 (H) 08/15/2021  ? CREATININE 1.65 (H) 08/14/2021  ? CREATININE 1.69 (H) 08/13/2021  ?Monitor renal function while on diuretics.  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 ?Hypokalemia ?Replete and recheck.  Pharmacy managing ? ?Symptomatic anemia ?Hemoglobin 10.1 (7.6) s/p 1 PRBC transfusion.  Patient has undergone upper and lower endoscopy without any conclusive bleeder.  She reports no obvious bleed or melena.  H&H stable ? ?BMI 38.0-38.9,adult ?Morbid obesity with BMI of 40 and hypertension.  Complicates overall prognosis ? ? ? ? ?  ? ?Subjective: Complains of right hip pain.  ? ?Physical Exam: ?Vitals:  ? 08/15/21 0500 08/15/21 0847 08/15/21 1316 08/15/21 1647  ?BP: (!) 120/49 (!) 129/52 (!) 133/56 (!) 142/54  ?Pulse: 63 66 66 61  ?Resp: '16 16 18 16  '$ ?Temp: 99.5 ?F (37.5 ?C) 98.6 ?F (37 ?C) 97.8 ?F (36.6 ?C) 98.5 ?F (36.9 ?C)  ?TempSrc: Oral Oral Oral Oral  ?SpO2: 95% 99% 100% 97%  ?Weight: 89.8 kg     ?Height:      ? ?General exam:?Appears calm and comfortable  ?Respiratory system:??Normal work of breathing.  ?Cardiovascular system:?S1-S2, tachycardic, regular rhythm, no murmurs, marked pitting edema BLE ?Gastrointestinal system:?Obese, NT/ND, normal bowel sounds ?Central nervous system:?Alert and oriented. No focal neurological deficits. ?Extremities: Symmetric 5 x 5 power. ?Skin:?Pallor present, no rashes, lesions or ulcers ?Psychiatry:?Judgement and insight appear normal. Mood &?affect appropriate. ? ?Data Reviewed: ? ?Creatinine 1.4 ? ?Family Communication: Son was updated yesterday ? ?Disposition: ?Status is: Inpatient ?Remains inpatient appropriate because: Ongoing diuresis ? ? Planned Discharge Destination: Home with Home Health ? ? ? DVT prophylaxis-SCD ?Time spent: 35 minutes ? ?Author: ?Max Sane,  MD ?08/15/2021 5:51 PM ? ?For on call review [email or web address].  ?

## 2021-08-15 NOTE — TOC Progression Note (Addendum)
Transition of Care (TOC) - Progression Note  ? ? ?Patient Details  ?Name: April [name] ?MRN: [id number] ?Date of Birth: [birth year] ? ?Transition of Care (TOC) CM/SW Contact  ?Candie Chroman, LCSW ?Phone Number: ?08/15/2021, 10:36 AM ? ?Clinical Narrative:  Left voicemail for [facility] worker to see if they would need any orders for therapy at discharge.  ? ?3:05 pm: Faxed home health orders to [facility]. ? ?Expected Discharge Plan: [place] ?Barriers to Discharge: Continued Medical Work up ? ?Expected Discharge Plan and Services ?Expected Discharge Plan: [place] ?  ?Discharge Planning Services: CM Consult ?Post Acute Care Choice: Home Health ?Living arrangements for the past 2 months: [place], [place] ?                ?DME Arranged: N/A ?DME Agency: NA ?  ?  ?  ?  ?  ?  ?  ?  ? ? ?Social Determinants of Health (SDOH) Interventions ?  ? ?Readmission Risk Interventions ?   ? View : No data to display.  ?  ?  ?  ? ? ?

## 2021-08-15 NOTE — Assessment & Plan Note (Signed)
Hemoglobin 10.1 (7.6) s/p 1 PRBC transfusion.  Patient has undergone upper and lower endoscopy without any conclusive bleeder.  She reports no obvious bleed or melena.  H&H stable ?

## 2021-08-15 NOTE — Assessment & Plan Note (Signed)
Replete and recheck.  Pharmacy managing ?

## 2021-08-15 NOTE — Assessment & Plan Note (Signed)
EF 55 to 60% on last echo.  She seems markedly volume overloaded and dyspneic at rest. ?Net IO Since Admission: -10,909 mL [08/15/21 1751]  ?Continue IV Lasix every 8 hours per cardiology.  Patient requesting not to discharge her too soon ?

## 2021-08-15 NOTE — Care Management Important Message (Signed)
Important Message ? ?Patient Details  ?Name: April [name] ?MRN: [id number] ?Date of Birth: 1940/09/23 ? ? ?Medicare Important Message Given:  Yes ? ? ? ? ?Dannette Barbara ?08/15/2021, 4:05 PM ?

## 2021-08-15 NOTE — Progress Notes (Signed)
? ?Progress Note ? ?Patient Name: April [name] ?Date of Encounter: 08/15/2021 ? ?[facility] Cardiologist: Ida Rogue, MD  ? ?Subjective  ? ?UOP -2L. Volume status improving but not back to baseline. AM labs pending.  ? ?Inpatient Medications  ?  ?Scheduled Meds: ? acidophilus  2 capsule Oral Daily  ? amiodarone  200 mg Oral Daily  ? cholecalciferol  1,000 Units Oral Daily  ? furosemide  60 mg Intravenous Q8H  ? mometasone-formoterol  2 puff Inhalation BID  ? pantoprazole  40 mg Oral BID  ? polyvinyl alcohol  1 drop Both Eyes Daily  ? potassium chloride  40 mEq Oral BID  ? potassium chloride  40 mEq Oral Once  ? rosuvastatin  40 mg Oral Daily  ? ?Continuous Infusions: ? ?PRN Meds: ?acetaminophen, albuterol, loperamide  ? ?Vital Signs  ?  ?Vitals:  ? 08/14/21 2030 08/14/21 2355 08/15/21 0500 08/15/21 0847  ?BP: (!) 129/48 (!) 146/55 (!) 120/49 (!) 129/52  ?Pulse: (!) 58 62 63 66  ?Resp: '18 18 16 16  '$ ?Temp: 97.7 ?F (36.5 ?C) 98.2 ?F (36.8 ?C) 99.5 ?F (37.5 ?C) 98.6 ?F (37 ?C)  ?TempSrc: Oral Oral Oral Oral  ?SpO2: 97% 98% 95% 99%  ?Weight:   89.8 kg   ?Height:      ? ? ?Intake/Output Summary (Last 24 hours) at 08/15/2021 0947 ?Last data filed at 08/15/2021 0858 ?Gross per 24 hour  ?Intake 240 ml  ?Output 2050 ml  ?Net -1810 ml  ? ? ?  08/15/2021  ?  5:00 AM 08/14/2021  ?  4:28 AM 08/12/2021  ?  5:00 AM  ?Last 3 Weights  ?Weight (lbs) 197 lb 15.6 oz 201 lb 11.5 oz 193 lb 1.6 oz  ?Weight (kg) 89.8 kg 91.5 kg 87.59 kg  ?   ? ?Telemetry  ?  ?NSR. HR 60s, intermittent 1st degree AV block - Personally Reviewed ? ?ECG  ?  ?No new - Personally Reviewed ? ?Physical Exam  ? ?GEN: No acute distress.   ?Neck: No JVD ?Cardiac: RRR, no murmurs, rubs, or gallops.  ?Respiratory: Clear to auscultation bilaterally. ?GI: Soft, nontender, non-distended  ?MS: minimal lower leg edema; No deformity. ?Neuro:  Nonfocal  ?Psych: Normal affect  ? ?Labs  ?  ?High Sensitivity Troponin:   ?Recent Labs  ?Lab 08/08/21 ?1745 08/08/21 ?1934   ?TROPONINIHS 15 16  ?   ?Chemistry ?Recent Labs  ?Lab 08/09/21 ?0430 08/10/21 ?2585 08/11/21 ?2778 08/12/21 ?2423 08/13/21 ?5361 08/14/21 ?4431 08/15/21 ?0617  ?NA 138   < > 137   < > 139 139 141  ?K 3.6   < > 3.0*   < > 3.5 4.0 3.6  ?CL 105   < > 104   < > 99 99 102  ?CO2 24   < > 26   < > 32 32 31  ?GLUCOSE 110*   < > 96   < > 88 101* 92  ?BUN 29*   < > 36*   < > 33* 32* 31*  ?CREATININE 1.79*   < > 1.74*   < > 1.69* 1.65* 1.40*  ?CALCIUM 8.2*   < > 8.1*   < > 8.5* 8.3* 8.5*  ?MG 2.2  --  2.2  --   --   --  2.1  ?PROT 5.5*  --   --   --   --   --   --   ?ALBUMIN 2.4*  --   --   --   --   --   --   ?  AST 47*  --   --   --   --   --   --   ?ALT 25  --   --   --   --   --   --   ?ALKPHOS 75  --   --   --   --   --   --   ?BILITOT 0.7  --   --   --   --   --   --   ?GFRNONAA 28*   < > 29*   < > 30* 31* 38*  ?ANIONGAP 9   < > 7   < > '8 8 8  '$ ? < > = values in this interval not displayed.  ?  ?Lipids No results for input(s): CHOL, TRIG, HDL, LABVLDL, LDLCALC, CHOLHDL in the last 168 hours.  ?Hematology ?Recent Labs  ?Lab 08/12/21 ?3557 08/13/21 ?0458 08/14/21 ?0431  ?WBC 6.0 6.7 6.8  ?RBC 3.14* 3.19* 3.03*  ?HGB 9.3* 9.3* 8.9*  ?HCT 28.3* 28.6* 28.1*  ?MCV 90.1 89.7 92.7  ?MCH 29.6 29.2 29.4  ?MCHC 32.9 32.5 31.7  ?RDW 14.6 14.6 14.6  ?PLT 188 195 186  ? ?Thyroid No results for input(s): TSH, FREET4 in the last 168 hours.  ?BNP ?Recent Labs  ?Lab 08/08/21 ?1745  ?BNP 1,032.5*  ?  ?DDimer No results for input(s): DDIMER in the last 168 hours.  ? ?Radiology  ?  ?No results found. ? ?Cardiac Studies  ? ?Echo 08/11/21 ?1. Left ventricular ejection fraction, by estimation, is 60 to 65%. The  ?left ventricle has normal function. The left ventricle has no regional  ?wall motion abnormalities. Left ventricular diastolic parameters are  ?consistent with Grade II diastolic  ?dysfunction (pseudonormalization).  ? 2. Right ventricular systolic function is normal. The right ventricular  ?size is mildly enlarged. There is mildly  elevated pulmonary artery  ?systolic pressure. The estimated right ventricular systolic pressure is  ?32.2 mmHg.  ? 3. Left atrial size was mildly dilated.  ? 4. Right atrial size was mildly dilated.  ? 5. The mitral valve is normal in structure. Mild mitral valve  ?regurgitation.  ? 6. Tricuspid valve regurgitation is moderate.  ? 7. The aortic valve is grossly normal. Aortic valve regurgitation is not  ?visualized.  ? 8. The inferior vena cava is normal in size with greater than 50%  ?respiratory variability, suggesting right atrial pressure of 3 mmHg.  ?  ? ?Patient Profile  ?   ?81 y.o. female female with a hx of CAD s/p prior RCA and pDA stenting, HFpEF, PAF on Eliquis, anemia with recurrent GI bleeding, CKD stage III, HTN, HLD, and sleep apnea, who is being followed by CHF. ? ?Assessment & Plan  ?  ?Acute on chronic diastolic CHF ?- Sxs exacerbated by anemia, recent GIB, s/p blood transfusions and IVF ?- echo showed LVEF 60-65%, G2DD ?- IV lasix '60mg'$  Q8H ?- PTA alternating lasix '40mg'$  and '80mg'$  ?- net -9.6L ?- kidney function improving ?- No BB 2/2 bradycardia ?- continue with diuresis, can possibly switch to oral lasix tomorrow ? ?Anemia/GIB ?- 2 recent admissions for GIB ?- s/p transfusion. Hgb 8.9 yesterday, labs pending for today ?- a/c held ?- pt underwent colonoscopy and endoscopy with no obvious bleeding ? ?CAD with chronic stable angina ?- Aspirin held for anemia ?- No anginal symptoms ?- No plan for ischemic work-up ?- continue statin therapy ? ?CKD stage 3 ?- Baseline around ?1.4 ?- monitor with diuresis ? ?Paroxysmal Afib ?-  maintaining NSR on amiodarone ?- a/c held for anemia as above ?- No BB 2/2 bradycardia ? ? ? ? ?For questions or updates, please contact [place] ?Please consult [email or web address] for contact info under  ? ?  ?   ?Signed, ?Lashaye Fisk Ninfa Meeker, PA-C  ?08/15/2021, 9:47 AM    ?

## 2021-08-15 NOTE — Consult Note (Signed)
PHARMACY CONSULT NOTE - FOLLOW UP ? ?Pharmacy Consult for Electrolyte Monitoring and Replacement  ? ?Recent Labs: ?Potassium (mmol/L)  ?Date Value  ?08/15/2021 3.6  ?03/23/2014 3.9  ? ?Magnesium (mg/dL)  ?Date Value  ?08/15/2021 2.1  ? ?Calcium (mg/dL)  ?Date Value  ?08/15/2021 8.5 (L)  ? ?Albumin (g/dL)  ?Date Value  ?08/09/2021 2.4 (L)  ? ?Phosphorus (mg/dL)  ?Date Value  ?08/15/2021 3.7  ? ?Sodium (mmol/L)  ?Date Value  ?08/15/2021 141  ?04/29/2018 147 (H)  ? ? ? ?Assessment: ?81 y.o. female with medical history significant for paroxysmal A-fib on Eliquis, HFpEF, coronary artery disease status post PCI of RCA in 2017, essential hypertension, OSA on CPAP, chronic lower extremity edema, CKD 3B, hyperlipidemia, GERD, type 2 diabetes, recently diagnosed erosive gastritis (EGD 07/10/21) on twice daily PPI, who presented to [facility] ED with complaints of gradually worsening shortness of breath of 1 month duration.  ? ?Currently on lasix 60 mg IV TID, and amiodarone 200 mg daily,  ? ?Goal of Therapy:  ?K+ > 4 ?Mg > 2 ? ?Plan:  ?K: 3.5>4 after additional 74mq x1 yesterday. Now K+ of 3.6. ?Continues KCL 40 mEq BID; Will give an additional K+ 40 mEq at lunch time based on prior response.  ?F/u with AM labs.  ? ? ?BLorna Dibble PharmD ?Clinical Pharmacist ?08/15/2021 ?8:34 AM ? ? ?

## 2021-08-15 NOTE — Assessment & Plan Note (Signed)
Lab Results  ?Component Value Date  ? CREATININE 1.40 (H) 08/15/2021  ? CREATININE 1.65 (H) 08/14/2021  ? CREATININE 1.69 (H) 08/13/2021  ?Monitor renal function while on diuretics.  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

## 2021-08-15 NOTE — Progress Notes (Signed)
Occupational Therapy Treatment ?Patient Details ?Name: April [name] ?MRN: [id number] ?DOB: 11/12/1940 ?Today's Date: 08/15/2021 ? ? ?History of present illness 81 y.o. female with medical history significant for paroxysmal A-fib on Eliquis, HFpEF, coronary artery disease status post PCI of RCA in 2017, essential hypertension, OSA on CPAP, chronic lower extremity edema, CKD 3B, hyperlipidemia, GERD, type 2 diabetes, recently diagnosed erosive gastritis (EGD 07/10/21) on twice daily PPI, who presented to [facility] ED with complaints of gradually worsening shortness of breath of 1 month duration.  Associated with 15 pounds weight gain, and abdominal distention within 2 weeks. Pt admitted for acute on chronic diastolic CHF ?  ?OT comments ? Pt seen for OT treatment on this date. Upon arrival to room, pt awake and seated EOB. Pt c/o of 8/10 R hip/lower back pain (attributing to rolling on telemetry box when asleep in bed), however reporting that RN provide pain medication prior to this author's arrival and agreeable to OT tx. While seated EOB, pt participated in seated lateral trunk stretch and educated on the benefits of stretching and repositioning for back pain; pt verbalized understanding and reported that pain slightly improved following seated stretches. Pt required MIN GUARD for functional mobility of short household distances (54f) with SPC and attempted to perform standing grooming tasks, however pt reporting pain significantly impacting pt's ability to maintain static standing balance at sink for >30 sec. Pt walked to recliner and performed stand>sit transfer with SUPERVISION-MIN GUARD, and was able to complete seated grooming tasks with SET-UP assist. This session significantly limited by pain. Will continue to follow POC. Discharge recommendation remains appropriate.    ? ?Recommendations for follow up therapy are one component of a multi-disciplinary discharge planning process, led by the attending physician.   Recommendations may be updated based on patient status, additional functional criteria and insurance authorization. ?   ?Follow Up Recommendations ? Home health OT  ?  ?Assistance Recommended at Discharge Intermittent Supervision/Assistance  ?Patient can return home with the following ? A little help with walking and/or transfers;A little help with bathing/dressing/bathroom;Assist for transportation ?  ?Equipment Recommendations ? None recommended by OT  ?  ?   ?Precautions / Restrictions Precautions ?Precautions: None ?Precaution Comments: low fall risk ?Restrictions ?Weight Bearing Restrictions: No  ? ? ?  ? ?Mobility Bed Mobility ?  ?  ?  ?  ?  ?  ?  ?General bed mobility comments: not assessed, pt seated EOB pre session and in chair post session ?  ? ?Transfers ?Overall transfer level: Needs assistance ?Equipment used: Straight cane ?Transfers: Sit to/from Stand ?Sit to Stand: Supervision ?  ?  ?  ?  ?  ?General transfer comment: Requires increased time/effort 2/2 significant RLE pain ?  ?  ?Balance Overall balance assessment: Needs assistance ?Sitting-balance support: No upper extremity supported, Feet supported ?Sitting balance-Leahy Scale: Good ?Sitting balance - Comments: Good sitting balance reaching within BOS ?  ?Standing balance support: Reliant on assistive device for balance, Single extremity supported, During functional activity ?Standing balance-Leahy Scale: Fair ?Standing balance comment: Pt extremly pain limited, requiring MIN GUARD-SUPERVISION for functional mobility with SPC ?  ?  ?  ?  ?  ?  ?  ?  ?  ?  ?  ?   ? ?ADL either performed or assessed with clinical judgement  ? ?ADL Overall ADL's : Needs assistance/impaired ?  ?  ?Grooming: WDance movement psychotherapistWash/dry hands;Brushing hair;Set up;Sitting ?Grooming Details (indicate cue type and reason): Pt attempted to complete standing grooming  tasks, however unable to tolerate standing 2/2 significant R hip pain ?  ?  ?  ?  ?  ?  ?  ?  ?  ?  ?  ?  ?  ?   ?Functional mobility during ADLs: Min guard;Cane (to walk 20 ft. MIN GUARD d/t significant R hip pain and decreased balance) ?  ?  ? ? ? ?Cognition Arousal/Alertness: Awake/alert ?Behavior During Therapy: [facility] for tasks assessed/performed ?Overall Cognitive Status: Within Functional Limits for tasks assessed ?  ?  ?  ?  ?  ?  ?  ?  ?  ?  ?  ?  ?  ?  ?  ?  ?  ?  ?  ?   ?Exercises Other Exercises ?Other Exercises: Pt educated on benefits of repositioning and seated lateral trunk stretch for back pain management. Pt reporting that performing seated lateral trunk stretch and sitting upright in recliner improved back pain. ? ?  ?   ?   ? ? ?Pertinent Vitals/ Pain       Pain Assessment ?Pain Assessment: 0-10 ?Pain Score: 8  ?Pain Location: R hip ?Pain Descriptors / Indicators: Pounding, Discomfort, Grimacing ?Pain Intervention(s): Monitored during session, Repositioned, Premedicated before session ? ?   ?   ? ?Frequency ? Min 2X/week  ? ? ? ? ?  ?Progress Toward Goals ? ?OT Goals(current goals can now be found in the care plan section) ? Progress towards OT goals: Progressing toward goals ? ?Acute Rehab OT Goals ?Patient Stated Goal: to have less pain ?OT Goal Formulation: With patient ?Time For Goal Achievement: 08/26/21 ?Potential to Achieve Goals: Good  ?Plan Discharge plan remains appropriate;Frequency remains appropriate   ? ?   ?AM-PAC OT "6 Clicks" Daily Activity     ?Outcome Measure ? ? Help from another person eating meals?: None ?Help from another person taking care of personal grooming?: A Little ?Help from another person toileting, which includes using toliet, bedpan, or urinal?: A Little ?Help from another person bathing (including washing, rinsing, drying)?: A Little ?Help from another person to put on and taking off regular upper body clothing?: None ?Help from another person to put on and taking off regular lower body clothing?: A Lot ?6 Click Score: 19 ? ?  ?End of Session Equipment Utilized During  Treatment: Other (comment) [facility]) ? ?OT Visit Diagnosis: Unsteadiness on feet (R26.81);Repeated falls (R29.6);Muscle weakness (generalized) (M62.81) ?  ?Activity Tolerance Patient limited by pain ?  ?Patient Left in chair;with call bell/phone within reach ?  ?Nurse Communication Mobility status ?  ? ?   ? ?Time: 0973-5329 ?OT Time Calculation (min): 15 min ? ?Charges: OT General Charges ?$OT Visit: 1 Visit ?OT Treatments ?$Therapeutic Activity: 8-22 mins ? ?Fredirick Maudlin, OTR/L ?[place] ? ?

## 2021-08-15 NOTE — Assessment & Plan Note (Signed)
Morbid obesity with BMI of 40 and hypertension.  Complicates overall prognosis ?

## 2021-08-15 NOTE — Assessment & Plan Note (Signed)
Rate controlled with amiodarone. Off anticoagulation due to recent GI bleed. ?

## 2021-08-15 NOTE — Assessment & Plan Note (Signed)
Off anticoagulation due to 2 recent admissions for GI bleed.  No obvious GI bleed.  Patient had undergone colonoscopy on 3/13 showing tubular adenoma, diverticulosis and nonbleeding internal hemorrhoids.  EGD showed mild erythematous gastric mucosa with no obvious bleeding.  Negative H. Pylori. ? ?

## 2021-08-16 ENCOUNTER — Telehealth: Payer: Self-pay | Admitting: Internal Medicine

## 2021-08-16 DIAGNOSIS — I509 Heart failure, unspecified: Secondary | ICD-10-CM | POA: Diagnosis not present

## 2021-08-16 DIAGNOSIS — J449 Chronic obstructive pulmonary disease, unspecified: Secondary | ICD-10-CM

## 2021-08-16 DIAGNOSIS — K922 Gastrointestinal hemorrhage, unspecified: Secondary | ICD-10-CM | POA: Diagnosis not present

## 2021-08-16 DIAGNOSIS — I5033 Acute on chronic diastolic (congestive) heart failure: Secondary | ICD-10-CM | POA: Diagnosis not present

## 2021-08-16 LAB — CBC
HCT: 28.7 % — ABNORMAL LOW (ref 36.0–46.0)
Hemoglobin: 9.2 g/dL — ABNORMAL LOW (ref 12.0–15.0)
MCH: 29.8 pg (ref 26.0–34.0)
MCHC: 32.1 g/dL (ref 30.0–36.0)
MCV: 92.9 fL (ref 80.0–100.0)
Platelets: 160 10*3/uL (ref 150–400)
RBC: 3.09 MIL/uL — ABNORMAL LOW (ref 3.87–5.11)
RDW: 14.7 % (ref 11.5–15.5)
WBC: 9.3 10*3/uL (ref 4.0–10.5)
nRBC: 0 % (ref 0.0–0.2)

## 2021-08-16 LAB — BASIC METABOLIC PANEL
Anion gap: 6 (ref 5–15)
BUN: 28 mg/dL — ABNORMAL HIGH (ref 8–23)
CO2: 30 mmol/L (ref 22–32)
Calcium: 8.2 mg/dL — ABNORMAL LOW (ref 8.9–10.3)
Chloride: 103 mmol/L (ref 98–111)
Creatinine, Ser: 1.41 mg/dL — ABNORMAL HIGH (ref 0.44–1.00)
GFR, Estimated: 37 mL/min — ABNORMAL LOW (ref >60)
Glucose, Bld: 105 mg/dL — ABNORMAL HIGH (ref 70–99)
Potassium: 4.4 mmol/L (ref 3.5–5.1)
Sodium: 139 mmol/L (ref 135–145)

## 2021-08-16 LAB — PHOSPHORUS: Phosphorus: 3.4 mg/dL (ref 2.5–4.6)

## 2021-08-16 LAB — MAGNESIUM: Magnesium: 2.1 mg/dL (ref 1.7–2.4)

## 2021-08-16 MED ORDER — TORSEMIDE 20 MG PO TABS: 20.0000 mg | ORAL_TABLET | Freq: Every evening | ORAL | 0 refills | Status: DC

## 2021-08-16 MED ORDER — TORSEMIDE 40 MG PO TABS: 40.0000 mg | ORAL_TABLET | Freq: Every morning | ORAL | 0 refills | Status: DC

## 2021-08-16 NOTE — Progress Notes (Signed)
[facility] Hospital Liaison note:  Notified via Epic workque from Dr. Vipul Shah of request for [facility] Palliative Care services. Will continue to follow for disposition.  Please call with any outpatient palliative questions or concerns.  Thank you for the opportunity to participate in this patient's care.  Thank you, Dee Curry, LPN [facility] Hospital Liaison [phone] 

## 2021-08-16 NOTE — Telephone Encounter (Signed)
Larena Glassman From [facility] called in stating that pt will be discharge from hospital today... Larena Glassman stated that provider at hospital would like for pt to have a hospital follow up within a week... Raelene Bott at hospital at [phone] or call pt back ?

## 2021-08-16 NOTE — Consult Note (Signed)
PHARMACY CONSULT NOTE - FOLLOW UP ? ?Pharmacy Consult for Electrolyte Monitoring and Replacement  ? ?Recent Labs: ?Potassium (mmol/L)  ?Date Value  ?08/16/2021 4.4  ?03/23/2014 3.9  ? ?Magnesium (mg/dL)  ?Date Value  ?08/16/2021 2.1  ? ?Calcium (mg/dL)  ?Date Value  ?08/16/2021 8.2 (L)  ? ?Albumin (g/dL)  ?Date Value  ?08/09/2021 2.4 (L)  ? ?Phosphorus (mg/dL)  ?Date Value  ?08/16/2021 3.4  ? ?Sodium (mmol/L)  ?Date Value  ?08/16/2021 139  ?04/29/2018 147 (H)  ? ? ? ?Assessment: ?81 y.o. female with medical history significant for paroxysmal A-fib on Eliquis, HFpEF, coronary artery disease status post PCI of RCA in 2017, essential hypertension, OSA on CPAP, chronic lower extremity edema, CKD 3B, hyperlipidemia, GERD, type 2 diabetes, recently diagnosed erosive gastritis (EGD 07/10/21) on twice daily PPI, who presented to [facility] ED with complaints of gradually worsening shortness of breath of 1 month duration.  ? ?4/17 PM transitioned lasix 60 mg IV TID > torsemide '40mg'$  QAM & '20mg'$  PO QHS ?Continues amiodarone 200 mg daily  ? ?Goal of Therapy:  ?K+ > 4 ?Mg > 2 ? ?Plan:  ?IV>PO diuretics yesterday evening. Scr 1.74>1.65>1.41; UOP 1-1.15m/k/h. ?K: 3.6>4.4 after additional 427m x1 yesterday. ?Continue scheduled 40 meq BID, no additional doses today. ?Mg: 2.1 ?At goal, no repletion req'd at this time. ?F/u with AM labs.  ? ?BrLorna DibblePharmD ?Clinical Pharmacist ?08/16/2021 ?7:52 AM ? ? ?

## 2021-08-16 NOTE — Telephone Encounter (Signed)
Can you do TCM - on this pt.  Need to see how she is doing and will need to arrange f/u ?

## 2021-08-16 NOTE — TOC Transition Note (Signed)
Transition of Care (TOC) - CM/SW Discharge Note ? ? ?Patient Details  ?Name: April [name] ?MRN: [id number] ?Date of Birth: [birth year] ? ?Transition of Care (TOC) CM/SW Contact:  ?Candie Chroman, LCSW ?Phone Number: ?08/16/2021, 9:39 AM ? ? ?Clinical Narrative:   Patient has orders to discharge home today. [facility] confirmed receipt of home health orders. No further concerns. CSW signing off. ? ?Final next level of care: [place] ?Barriers to Discharge: Barriers Resolved ? ? ?Patient Goals and CMS Choice ?Patient states their goals for this hospitalization and ongoing recovery are:: to get rid of the extra fluid ?CMS Medicare.gov Compare Post Acute Care list provided to:: Patient ?Choice offered to / list presented to : Patient ? ?Discharge Placement ?  ?           ?  ?  ?  ?Patient and family notified of of transfer: 08/16/21 ? ?Discharge Plan and Services ?  ?Discharge Planning Services: CM Consult ?Post Acute Care Choice: Home Health          ?DME Arranged: N/A ?DME Agency: NA ?  ?  ?  ?  ?  ?  ?  ?  ? ?Social Determinants of Health (SDOH) Interventions ?  ? ? ?Readmission Risk Interventions ?   ? View : No data to display.  ?  ?  ?  ? ? ? ? ? ?

## 2021-08-17 ENCOUNTER — Telehealth: Payer: Self-pay

## 2021-08-17 ENCOUNTER — Ambulatory Visit: Payer: Medicare Other | Admitting: Internal Medicine

## 2021-08-17 NOTE — Telephone Encounter (Signed)
Pt called in to schedule Hospital F/U... Scheduled Pt Hospital F/U on 08/24/2021 at 11:30am...  ?

## 2021-08-17 NOTE — Telephone Encounter (Signed)
Transition Care Management Follow-up Telephone Call ?Date of discharge and from where: 08/16/21 -[place] ?How have you been since you were released from the hospital? Shortness of breath improved. R hip pain improved. Denies N/V/D, chest pain, weight gain and all other harmful symptoms. Reports feeling much better.  ?Any questions or concerns? No ? ?Items Reviewed: ?Did the pt receive and understand the discharge instructions provided? No  ?Medications obtained and verified? Declined going over medications. Notes no problems and will notify if any problems, questions or concerns.  ?CPAP in use. ?Any new allergies since your discharge? No  ?Dietary orders reviewed? Yes ?Do you have support at home? Yes , [place] staff/life alert. Son if needed.  ? ?Home Care and Equipment/Supplies: ?Were home health services ordered? PT/OT in [facility] at [facility].  ? ?Functional Questionnaire: (I = Independent and D = Dependent) ?ADLs: I ? ?Bathing/Dressing- I ? ?Meal Prep- staff assist ? ?Eating- I ? ?Maintaining continence- Poise pad in use ? ?Follow up appointments reviewed: ? ?PCP Hospital f/u appt confirmed? Yes  Scheduled to see PCP on 08/24/21 @ 11:30. ?[facility] f/u appt confirmed? Yes  Scheduled to see Cardiology, GI and Vascular per patient. ?Are transportation arrangements needed? No  ?If their condition worsens, is the pt aware to call PCP or go to the Emergency Dept.? Yes ?Was the patient provided with contact information for the PCP's office or ED? Yes ?Was to pt encouraged to call back with questions or concerns? Yes  ?

## 2021-08-17 NOTE — Telephone Encounter (Signed)
Thank you.  Let me know if any problems.   ?

## 2021-08-18 ENCOUNTER — Telehealth: Payer: Self-pay | Admitting: Cardiovascular Disease

## 2021-08-18 NOTE — Telephone Encounter (Signed)
Note from when pt was hospitalized stated: ? ?"Volume overload appears to be significantly improved and thus recommend switching to oral torsemide 40 mg in the morning and 20 mg in the afternoon.  This was recommended by the patient's primary cardiologist Dr. Rockey Situ." ? ?Called and reviewed information with patient. Patient verbalized understanding and voiced appreciation for the call.  ?

## 2021-08-18 NOTE — Telephone Encounter (Signed)
Patient is calling to verify how much of torsemide she is suppose to be taking.  ?

## 2021-08-19 NOTE — Progress Notes (Signed)
Patient ID: April [name], female    DOB: 05/14/1940, 81 y.o.   MRN: [id number]  HPI  April [name] is a 81 y/o female with a history of CAD, DM, hyperlipidemia, HTN, CKD, anemia, diverticulitis, endometriosis, GERD, GIB, LBBB, PAF, sleep apnea, spasmodic dysphonia and chronic heart failure.   Echo report from 08/10/21 reviewed and showed an EF of 60-65% along with mildly elevated PA pressure, mild LAE, mild MR and moderate TR.   LHC done 03/25/16 and showed: Prox RCA lesion, 80 %stenosed. A STENT PROMUS PREM MR 3.5X16 (3.8-4.1 mm) drug eluting stent was successfully placed. Post intervention, there is a 0% residual stenosis. Ost RPDA lesion, 90 %stenosed. A STENT PROMUS PREM MR 2.5X12 (2.7 mm) drug eluting stent was successfully placed. Post intervention, there is a 0% residual stenosis. __________________________________ Prox RCA to Mid RCA lesion, 50 %stenosed. Dist RCA lesion, 30 %stenosed.- Appears smooth and chronic Dist LAD lesion, 25 %stenosed - diffuse disease in the distal LAD. Nonsignificant The left ventricular systolic function is normal. The left ventricular ejection fraction is 55-65% by visual estimate. There is no mitral valve regurgitation. There is no aortic valve stenosis.  Admitted 08/08/21 due to worsening shortness of breath, weight gain and abdominal swelling. Initially given IV lasix with transition to oral diuretics. Cardiology and palliative care consults obtained. Given 1 unit PRBC's due to anemia. Hypokalemia corrected. OT/PT evaluations done. Discharged after 8 days.   She presents today for her initial visit with a chief complaint of moderate fatigue with minimal exertion. She describes this as chronic in nature. She has associated cough, shortness of breath, wheezing and pedal edema along with this.   Currently living at [facility] and eats her meals in the cafeteria. Does not add any salt to her food.   Currently taking '40mg'$  torsemide QAM & '20mg'$  QPM  Past  Medical History:  Diagnosis Date   Anemia    CAD S/P PCI pRCA Promus DES 3.5 x 16 (4.1 mm), ostRPDA Promus DES 2.5 x 12 (2.7 mm) 03/25/2016   CAD S/P percutaneous coronary angioplasty    a. 07/2015 MV: No ischemia, EF 66%;  b. 03/2016 Inflat STEMI/PCI: LM nl, LAD 25d, RI nl, LCX nl, OM1/2 nl, RCA 80p (3.5x16 Promus Premier DES), 50p/m, 30d, RPDA 90 (2.5x12 Promu Premier DES); c. 01/2018 Cath ([place], [place]): Patent RCA stents->Med Rx.   CKD (chronic kidney disease), stage III ([place])    Diastolic dysfunction    a. 10/2013 Echo: EF 55-65%, Gr1 DD; b. 01/2018 Echo ([place], [place]): EF 55-60%, Gr2 DD, RVSP 59mHg.   Diverticulitis    Dyspnea    Endometriosis    Family history of adverse reaction to anesthesia    April [name] - had to stay in ICU because of breathing difficulties every time she had anesthesia   GERD (gastroesophageal reflux disease)    GI bleed    a. 06/2021 - melena-->2 u PRBCs.   Hiatal hernia    History of colon polyps 08/1994   History of Migraines    Hypercholesterolemia    Hypertension    LBBB (left bundle branch block)    Osteoporosis    PAF (paroxysmal atrial fibrillation) ([place]    a. 03/2016 Dx @ time of MI; b. CHA2DS2VASc = 5-->April [name].   Rheumatic fever    Sleep apnea    CPAP   Spasmodic dysphonia    ST elevation myocardial infarction (STEMI) of inferolateral wall, initial episode of care ([facility] 03/25/2016  ST elevation myocardial infarction (STEMI) of inferolateral wall, initial episode of care [facility]) 03/25/2016   Urine incontinence    Past Surgical History:  Procedure Laterality Date   ABDOMINAL HYSTERECTOMY     ovaries not removed   APPENDECTOMY     was removed during hysterectomy   Breast biopsies     x2   BREAST EXCISIONAL BIOPSY Bilateral "years ago"   neg   BREAST SURGERY Bilateral    BROW LIFT Bilateral 08/15/2019   Procedure: BLEPHAROPLASTY UPPER EYELID; W/EXCESS SKIN BLEPHAROPTOSIS REPAIR; RESECT EX;  Surgeon: April Starch, MD;  Location: [place];  Service: Ophthalmology;  Laterality: Bilateral;  sleep apnea   CARDIAC CATHETERIZATION  2011   moderate 40% RCA disease   CARDIAC CATHETERIZATION  01/2010   April [name]'@[facility]'$ : Only noted 40% RCA   CARDIAC CATHETERIZATION N/A 03/25/2016   Procedure: Left Heart Cath and Coronary Angiography;  Surgeon: 04/07/2016, MD;  Location: [place] [facility];  Service: Cardiovascular;  Laterality: N/A;   CARDIAC CATHETERIZATION N/A 03/25/2016   Procedure: Coronary Stent Intervention;  Surgeon: 04/07/2016, MD;  Location: [place] [facility];  Service: Cardiovascular;  Laterality: N/A;   COLONOSCOPY  2013   COLONOSCOPY WITH PROPOFOL N/A 07/11/2021   Procedure: COLONOSCOPY WITH PROPOFOL;  Surgeon: 07/24/2021, MD;  Location: [facility] ENDOSCOPY;  Service: Endoscopy;  Laterality: N/A;   ESOPHAGOGASTRODUODENOSCOPY (EGD) WITH PROPOFOL N/A 03/17/2015   Procedure: ESOPHAGOGASTRODUODENOSCOPY (EGD) WITH PROPOFOL;  Surgeon: 03/30/2015, MD;  Location: [facility] ENDOSCOPY;  Service: Gastroenterology;  Laterality: N/A;   ESOPHAGOGASTRODUODENOSCOPY (EGD) WITH PROPOFOL N/A 07/10/2021   Procedure: ESOPHAGOGASTRODUODENOSCOPY (EGD) WITH PROPOFOL;  Surgeon: 16/03/2022, MD;  Location: [facility] ENDOSCOPY;  Service: Gastroenterology;  Laterality: N/A;   GIVENS CAPSULE STUDY N/A 07/11/2021   Procedure: GIVENS CAPSULE STUDY;  Surgeon: 07/24/2021, MD;  Location: [facility] ENDOSCOPY;  Service: Endoscopy;  Laterality: N/A;   NM MYOVIEW ([place] HX)  07/2015   No evidence ischemia or infarction. EF 66%. Low risk   TONSILLECTOMY     TRANSTHORACIC ECHOCARDIOGRAM  10/2013   Normal LV size and function. EF 55-65%. GR 1 DD. Otherwise normal.   Family History  Problem Relation Age of Onset   Arthritis April [name]    Stroke April [name]    Hypertension April [name]    Osteoporosis April [name]    Heart disease April [name]        MI   Heart attack April [name]    Stroke April [name]    Heart attack April [name]    Breast cancer  Other        first cousin x 2   Stroke April [name]    Heart attack April [name]    Colon cancer Neg Hx    Social History   Tobacco Use   Smoking status: Never   Smokeless tobacco: Never  Substance Use Topics   Alcohol use: No    Alcohol/week: 0.0 standard drinks   Allergies  Allergen Reactions   Penicillins Other (See Comments)    Okay to take amoxicillin/(pt does not recall what the reaction to penicillin was (57-29 years old)    Sulfa Antibiotics Rash   Prior to Admission medications   Medication Sig Start Date End Date Taking? Authorizing Provider  acetaminophen (TYLENOL) 500 MG tablet Take 500 mg by mouth daily as needed for headache (pain).   Yes [provider]  albuterol (PROVENTIL HFA;VENTOLIN HFA) 108 (90 Base) MCG/ACT inhaler Inhale 2 puffs into the lungs every 6 (six) hours as needed for  wheezing or shortness of breath. 04/22/18  Yes Kasa, Maretta Bees, MD  amiodarone (PACERONE) 200 MG tablet TAKE 1 TABLET BY MOUTH DAILY 06/30/21  Yes [name], Kathlene November, MD  Azelastine HCl 137 MCG/SPRAY SOLN USE 1 TO 2 SPRAYS IN EACH NOSTRIL TWICE A DAY AS NEEDED FOR DRAINAGE 01/20/20  Yes Einar Pheasant, MD  cholecalciferol (VITAMIN D3) 25 MCG (1000 UNIT) tablet Take 1,000 Units by mouth daily.   Yes [provider]  fluticasone-salmeterol (ADVAIR HFA) 230-21 MCG/ACT inhaler Inhale 2 puffs into the lungs 2 (two) times daily. 10/20/20  Yes Flora Lipps, MD  Glucosamine-Chondroit-Collagen [phone] MG CAPS Take 1 tablet by mouth in the morning and at bedtime.   Yes [provider]  hydrALAZINE (APRESOLINE) 10 MG tablet TAKE ONE TABLET BY MOUTH THREE TIMES A DAY 04/15/21  Yes [name], Kathlene November, MD  mupirocin ointment (BACTROBAN) 2 % Apply 1 application. topically 2 (two) times daily.   Yes [provider]  nitroGLYCERIN (NITROSTAT) 0.4 MG SL tablet Place 1 tablet (0.4 mg total) under the tongue every 5 (five) minutes as needed. 04/22/18  Yes [name], Kathlene November, MD   pantoprazole (PROTONIX) 40 MG tablet Take 1 tablet (40 mg total) by mouth daily. 07/12/21  Yes Lorella Nimrod, MD  Polyethyl Glycol-Propyl Glycol (SYSTANE OP) Place 1 drop into both eyes daily as needed (dry eyes).   Yes [provider]  potassium chloride (KLOR-CON) 10 MEQ tablet TAKE 1 TABLET BY MOUTH TWICE A DAY 06/30/21  Yes [name], Kathlene November, MD  propranolol (INDERAL) 20 MG tablet Take 1 tablet (20 mg total) by mouth 3 (three) times daily. 07/29/21 10/27/21 Yes Theora Gianotti, NP  Propylene Glycol (SYSTANE COMPLETE) 0.6 % SOLN Apply 1 drop to eye daily.   Yes [provider]  rosuvastatin (CRESTOR) 40 MG tablet TAKE 1 TABLET BY MOUTH DAILY 05/25/21  Yes [name], Kathlene November, MD  torsemide (DEMADEX) 20 MG tablet Take 1 tablet (20 mg total) by mouth every evening. 08/16/21 09/15/21 Yes Max Sane, MD  torsemide 40 MG TABS Take 40 mg by mouth in the morning. 08/17/21 09/16/21 Yes Max Sane, MD  traMADol (ULTRAM) 50 MG tablet Take by mouth every 6 (six) hours as needed.   Yes [provider]   Review of Systems  Constitutional:  Positive for fatigue (easily). Negative for appetite change.  HENT:  Negative for congestion, postnasal drip and sore throat.   Eyes: Negative.   Respiratory:  Positive for cough (dry), shortness of breath and wheezing. Negative for chest tightness.   Cardiovascular:  Positive for leg swelling (improving). Negative for chest pain and palpitations.  Gastrointestinal:  Negative for abdominal distention and abdominal pain.  Endocrine: Negative.   Genitourinary: Negative.   Musculoskeletal:  Negative for back pain and neck pain.  Skin: Negative.   Allergic/Immunologic: Negative.   Neurological:  Negative for dizziness and light-headedness.  Hematological:  Negative for adenopathy. Does not bruise/bleed easily.  Psychiatric/Behavioral:  Negative for dysphoric mood and sleep disturbance (sleeping with HOB elevated;wearing CPAP nightly). The  patient is not nervous/anxious.    Vitals:   08/22/21 1535  BP: (!) 134/58  Pulse: (!) 54  Resp: 18  SpO2: 100%  Weight: 187 lb (84.8 kg)  Height: '4\' 11"'$  (1.499 m)   Wt Readings from Last 3 Encounters:  08/22/21 187 lb (84.8 kg)  08/16/21 195 lb 8.8 oz (88.7 kg)  07/26/21 205 lb (93 kg)   Lab Results  Component Value Date   CREATININE 1.41 (  H) 08/16/2021   CREATININE 1.40 (H) 08/15/2021   CREATININE 1.65 (H) 08/14/2021   Physical Exam Vitals and nursing note reviewed. Exam conducted with a chaperone present (son).  Constitutional:      Appearance: She is well-developed.  HENT:     Head: Normocephalic and atraumatic.  Neck:     Vascular: No JVD.  Cardiovascular:     Rate and Rhythm: Regular rhythm. Bradycardia present.  Pulmonary:     Effort: Pulmonary effort is normal.     Breath sounds: Normal breath sounds. No wheezing, rhonchi or rales.  Abdominal:     Palpations: Abdomen is soft.     Tenderness: There is no abdominal tenderness.  Musculoskeletal:     Cervical back: Normal range of motion and neck supple.     Right lower leg: No tenderness. Edema (2+ pitting) present.     Left lower leg: No tenderness. Edema (2+ pitting) present.  Skin:    General: Skin is warm and dry.  Neurological:     General: No focal deficit present.     Mental Status: She is alert and oriented to person, place, and time.  Psychiatric:        Mood and Affect: Mood normal.        Behavior: Behavior normal.    Assessment & Plan:  1: Chronic heart failure with preserved ejection fraction with structural changes (LAE)- - NYHA class III - euvolemic today - weighing daily; reminded to call for an overnight weight gain of > 2 pounds or a weekly weight gain of > 5 pounds - not adding salt and trying to eat low sodium foods - trying to keep daily fluid intake to < 60 ounces/ day - currently taking '40mg'$  torsemide QAM/ '20mg'$  QPM - unable to wear compression socks due to pedal edema - has been  to vascular provider in the past for unna boot wrapping but says that those hurt her legs - has used ACE wraps in the past - has compression boots that she can use but currently they hurt her legs  - reports that edema improves some overnight - discussed using entresto and/or jardiance but she would prefer to wait until she sees PCP and cardiology - BNP 08/08/21 was 1032.5  2: HTN- - BP mildly elevated (134/58) - saw PCP Nicki Reaper) 07/26/21; returns in 2 days - BMP 08/16/21 reviewed and showed sodium 139, potassium 4.4, creatinine 1.41 and GFR 37  3: PAF- - saw cardiology Sharolyn Douglas) 07/26/21 - currently holding April [name]  4: Anemia- - saw GI ([place]) 07/21/21 - Hg 08/16/21 was 9.2  5: DM- - A1c 01/20/21 was 6.2%  6: Sleep apnea- - wearing CPAP nightly   Patient did not bring her medications nor a list. Each medication was verbally reviewed with the patient and she was encouraged to bring the bottles to every visit to confirm accuracy of list.   Return in 6 weeks, sooner if needed.

## 2021-08-19 NOTE — Discharge Summary (Signed)
?Physician Discharge Summary ?  ?Patient: April [name] MRN: [id number] DOB: Sep 20, 1940  ?Admit date:     08/08/2021  ?Discharge date: 08/16/2021  ?Discharge Physician: Max Sane  ? ?PCP: Einar Pheasant, MD  ? ?Recommendations at discharge:  ? ? F/up with outpt providers ? ?Discharge Diagnoses: ?Principal Problem: ?  Acute on chronic diastolic CHF (congestive heart failure) ([place]) ?Active Problems: ?  GI bleeding ?  Paroxysmal atrial fibrillation (HCC) ?  Essential hypertension, benign ?  Acute renal failure superimposed on stage 3b chronic kidney disease ([place]) ?  BMI 38.0-38.9,adult ?  Symptomatic anemia ?  Hypokalemia ?  Acute on chronic congestive heart failure ([place]) ? ?Hospital Course: ?81 y.o. female with medical history significant for paroxysmal A-fib on Eliquis, HFpEF, coronary artery disease status post PCI of RCA in 2017, essential hypertension, OSA on CPAP, chronic lower extremity edema, CKD 3B, hyperlipidemia, GERD, type 2 diabetes, recently diagnosed erosive gastritis (EGD 07/10/21) on twice daily PPI, who presented to [facility] ED with complaints of gradually worsening shortness of breath of 1 month duration.  Associated with 15 pounds weight gain, and abdominal distention within 2 weeks.  Admitted for acute on chronic CHF exacerbation ? ?4/12: Continue ongoing diuresis per cardiology ?4/13: Hemoglobin 7.6, getting 1 PRBC transfusion.  Potassium 3.0 and being replaced ?4/14: Hemoglobin improved status post 1 PRBC transfusion yesterday.  Ongoing diuresis per cardiology ?4/15-4/17: Continue ongoing diuresis ? ?Assessment and Plan: ?* Acute on chronic diastolic CHF (congestive heart failure) ([place]) ?EF 55 to 60% on last echo.  She was markedly volume overloaded and dyspneic at rest on admission. ?Net IO Since Admission: -11,579 mL [08/19/21 1245] - Diuresed well with IV lasix and back to baseline, euvolemic at DC ?Symptoms likely worsened by blood loss anemia.  ?D/w cardio who recommends Torsemide 40 mg in am  and 20 mg in the afternoon at D/C. This was prescribed and sent to her pharmacy. Patient in agreement. ? ?GI bleeding ?Off anticoagulation due to 2 recent admissions for GI bleed.  No obvious GI bleed.  Patient had undergone colonoscopy on 3/13 showing tubular adenoma, diverticulosis and nonbleeding internal hemorrhoids.  EGD showed mild erythematous gastric mucosa with no obvious bleeding.  Negative H. Pylori. S/P 1 PRBC transfusions this admission. ? ?Paroxysmal atrial fibrillation ([place]) ?Rate controlled with amiodarone and in sinus rhythm. Off anticoagulation due to recent GI bleed. ?Cardio to consider outpt EP eval for Watchman device. ? ?Essential hypertension, benign ?Continue amiodarone and Lasix.  Blood pressure stable ? ?Acute renal failure superimposed on stage 3b chronic kidney disease ([place]) ?Lab Results  ?Component Value Date  ? CREATININE 1.40 (H) 08/15/2021  ? CREATININE 1.65 (H) 08/14/2021  ? CREATININE 1.69 (H) 08/13/2021  ?Creatinine improved with diuresis likely cardiorenal syndrome. Kidney function close to baseline at DC ? ?Hypokalemia ?Repleted ? ?Symptomatic anemia ?s/p 1 PRBC transfusion for Hb 7.6.  Patient has undergone upper and lower endoscopy without any conclusive bleeder.  She reports no obvious bleed or melena.  H&H stable ? ?BMI 38.0-38.9,adult ?Morbid obesity with BMI of 40 and hypertension.  Complicates overall prognosis ? ? ? ? ?  ? ? ?Consultants: Cardio, Palliative care ?Disposition: Home health ?Diet recommendation:  ?Discharge Diet Orders (From admission, onward)  ? ?  Start     Ordered  ? 08/16/21 0000  Diet - low sodium heart healthy       ? 08/16/21 0708  ? ?  ?  ? ?  ? ?Cardiac diet ?DISCHARGE MEDICATION: ?Allergies as  of 08/16/2021   ? ?   Reactions  ? Penicillins Other (See Comments)  ? Okay to take amoxicillin/(pt does not recall what the reaction to penicillin was (93-14 years old)  ? Sulfa Antibiotics Rash  ? ?  ? ?  ?Medication List  ?  ? ?STOP taking these medications    ? ?apixaban 2.5 MG Tabs tablet ?Commonly known as: ELIQUIS ?  ?furosemide 40 MG tablet ?Commonly known as: LASIX ?  ?ondansetron 4 MG disintegrating tablet ?Commonly known as: ZOFRAN-ODT ?  ? ?  ? ?TAKE these medications   ? ?acetaminophen 500 MG tablet ?Commonly known as: TYLENOL ?Take 500 mg by mouth daily as needed for headache (pain). ?  ?Advair HFA 230-21 MCG/ACT inhaler ?Generic drug: fluticasone-salmeterol ?Inhale 2 puffs into the lungs 2 (two) times daily. ?  ?albuterol 108 (90 Base) MCG/ACT inhaler ?Commonly known as: VENTOLIN HFA ?Inhale 2 puffs into the lungs every 6 (six) hours as needed for wheezing or shortness of breath. ?  ?amiodarone 200 MG tablet ?Commonly known as: PACERONE ?TAKE 1 TABLET BY MOUTH DAILY ?  ?Azelastine HCl 137 MCG/SPRAY Soln ?USE 1 TO 2 SPRAYS IN EACH NOSTRIL TWICE A DAY AS NEEDED FOR DRAINAGE ?  ?cholecalciferol 25 MCG (1000 UNIT) tablet ?Commonly known as: VITAMIN D3 ?Take 1,000 Units by mouth daily. ?  ?Glucosamine-Chondroit-Collagen 250-200-116.67 MG Caps ?Take 1 tablet by mouth in the morning and at bedtime. ?  ?hydrALAZINE 10 MG tablet ?Commonly known as: APRESOLINE ?TAKE ONE TABLET BY MOUTH THREE TIMES A DAY ?  ?mupirocin ointment 2 % ?Commonly known as: BACTROBAN ?Apply 1 application. topically 2 (two) times daily. ?  ?nitroGLYCERIN 0.4 MG SL tablet ?Commonly known as: Nitrostat ?Place 1 tablet (0.4 mg total) under the tongue every 5 (five) minutes as needed. ?  ?pantoprazole 40 MG tablet ?Commonly known as: PROTONIX ?Take 1 tablet (40 mg total) by mouth daily. ?  ?potassium chloride 10 MEQ tablet ?Commonly known as: KLOR-CON ?TAKE 1 TABLET BY MOUTH TWICE A DAY ?  ?propranolol 20 MG tablet ?Commonly known as: INDERAL ?Take 1 tablet (20 mg total) by mouth 3 (three) times daily. ?  ?rosuvastatin 40 MG tablet ?Commonly known as: CRESTOR ?TAKE 1 TABLET BY MOUTH DAILY ?  ?Systane Complete 0.6 % Soln ?Generic drug: Propylene Glycol ?Apply 1 drop to eye daily. ?  ?SYSTANE  OP ?Place 1 drop into both eyes daily as needed (dry eyes). ?  ?torsemide 20 MG tablet ?Commonly known as: DEMADEX ?Take 1 tablet (20 mg total) by mouth every evening. ?  ?Torsemide 40 MG Tabs ?Take 40 mg by mouth in the morning. ?  ?traMADol 50 MG tablet ?Commonly known as: ULTRAM ?Take by mouth every 6 (six) hours as needed. ?  ? ?  ? ? Follow-up Information   ? ? Einar Pheasant, MD. Schedule an appointment as soon as possible for a visit in 1 week(s).   ?Specialty: Internal Medicine ?Contact information: ?[address] ?[address] ?[place] [address] ?[phone] ? ? ?  ?  ? ? Minna Merritts, MD. Go in 2 week(s).   ?Specialty: Cardiology ?Why: Appointment on Monday, 08-29-2021 at 10:30am with Cadence Kathlen Mody, PA ?Contact information: ?[address] ?[place] [address] ?[phone] ? ? ?  ?  ? ?  ?  ? ?  ? ?Discharge Exam: ?Filed Weights  ? 08/14/21 0428 08/15/21 0500 08/16/21 0500  ?Weight: 91.5 kg 89.8 kg 88.7 kg  ? ?General exam: Appears calm and  comfortable  ?Respiratory system:  Normal work of breathing.  ?Cardiovascular system: S1-S2 normal. regular rhythm, no murmurs, trace edema BLE ?Gastrointestinal system: Obese, NT/ND, normal bowel sounds ?Central nervous system: Alert and oriented. No focal neurological deficits. ?Extremities: Symmetric 5 x 5 power. ?Skin: Pallor present, no rashes, lesions or ulcers ?Psychiatry: Judgement and insight appear normal. Mood & affect appropriate. ? ?Condition at discharge: good ? ?The results of significant diagnostics from this hospitalization (including imaging, microbiology, ancillary and laboratory) are listed below for reference.  ? ?Imaging Studies: ?DG Chest 2 View ? ?Result Date: 08/08/2021 ?CLINICAL DATA:  Shortness of breath. EXAM: CHEST - 2 VIEW COMPARISON:  07/06/2021 FINDINGS: Stable heart size. Stable appearance large hiatal hernia. Lungs demonstrate low lung volumes with new small bilateral pleural effusions and suggestion of  mild pulmonary interstitial edema. No pneumothorax or focal airspace consolidation. IMPRESSION: 1. Suggestion of mild pulmonary interstitial edema with new small bilateral pleural effusions. 2. Stable large

## 2021-08-22 ENCOUNTER — Encounter: Payer: Self-pay | Admitting: Family

## 2021-08-22 ENCOUNTER — Ambulatory Visit: Payer: Medicare Other | Attending: Family | Admitting: Family

## 2021-08-22 VITALS — BP 134/58 | HR 54 | Resp 18 | Ht 59.0 in | Wt 187.0 lb

## 2021-08-22 DIAGNOSIS — N1832 Chronic kidney disease, stage 3b: Secondary | ICD-10-CM | POA: Diagnosis not present

## 2021-08-22 DIAGNOSIS — I447 Left bundle-branch block, unspecified: Secondary | ICD-10-CM | POA: Diagnosis not present

## 2021-08-22 DIAGNOSIS — I10 Essential (primary) hypertension: Secondary | ICD-10-CM | POA: Diagnosis not present

## 2021-08-22 DIAGNOSIS — K219 Gastro-esophageal reflux disease without esophagitis: Secondary | ICD-10-CM | POA: Insufficient documentation

## 2021-08-22 DIAGNOSIS — K5792 Diverticulitis of intestine, part unspecified, without perforation or abscess without bleeding: Secondary | ICD-10-CM | POA: Insufficient documentation

## 2021-08-22 DIAGNOSIS — D649 Anemia, unspecified: Secondary | ICD-10-CM

## 2021-08-22 DIAGNOSIS — J383 Other diseases of vocal cords: Secondary | ICD-10-CM | POA: Insufficient documentation

## 2021-08-22 DIAGNOSIS — I480 Paroxysmal atrial fibrillation: Secondary | ICD-10-CM | POA: Diagnosis not present

## 2021-08-22 DIAGNOSIS — G4730 Sleep apnea, unspecified: Secondary | ICD-10-CM | POA: Diagnosis not present

## 2021-08-22 DIAGNOSIS — I5032 Chronic diastolic (congestive) heart failure: Secondary | ICD-10-CM | POA: Diagnosis not present

## 2021-08-22 DIAGNOSIS — E785 Hyperlipidemia, unspecified: Secondary | ICD-10-CM | POA: Diagnosis not present

## 2021-08-22 DIAGNOSIS — I2510 Atherosclerotic heart disease of native coronary artery without angina pectoris: Secondary | ICD-10-CM

## 2021-08-22 DIAGNOSIS — N809 Endometriosis, unspecified: Secondary | ICD-10-CM | POA: Diagnosis not present

## 2021-08-22 DIAGNOSIS — Z79899 Other long term (current) drug therapy: Secondary | ICD-10-CM | POA: Insufficient documentation

## 2021-08-22 DIAGNOSIS — I130 Hypertensive heart and chronic kidney disease with heart failure and stage 1 through stage 4 chronic kidney disease, or unspecified chronic kidney disease: Secondary | ICD-10-CM | POA: Insufficient documentation

## 2021-08-22 DIAGNOSIS — G4733 Obstructive sleep apnea (adult) (pediatric): Secondary | ICD-10-CM

## 2021-08-22 DIAGNOSIS — Z9861 Coronary angioplasty status: Secondary | ICD-10-CM

## 2021-08-22 DIAGNOSIS — E1122 Type 2 diabetes mellitus with diabetic chronic kidney disease: Secondary | ICD-10-CM

## 2021-08-22 MED ORDER — TORSEMIDE 20 MG PO TABS: ORAL_TABLET | ORAL | 5 refills | Status: DC

## 2021-08-22 NOTE — Patient Instructions (Signed)
Continue weighing daily and call for an overnight weight gain of 3 pounds or more or a weekly weight gain of more than 5 pounds.   If you have voicemail, please make sure your mailbox is cleaned out so that we may leave a message and please make sure to listen to any voicemails.     

## 2021-08-24 ENCOUNTER — Other Ambulatory Visit: Payer: Self-pay

## 2021-08-24 ENCOUNTER — Ambulatory Visit (INDEPENDENT_AMBULATORY_CARE_PROVIDER_SITE_OTHER): Payer: Medicare Other | Admitting: Internal Medicine

## 2021-08-24 ENCOUNTER — Encounter: Payer: Self-pay | Admitting: Internal Medicine

## 2021-08-24 VITALS — BP 108/58 | HR 53 | Temp 97.5°F | Ht 59.0 in | Wt 189.0 lb

## 2021-08-24 DIAGNOSIS — I25110 Atherosclerotic heart disease of native coronary artery with unstable angina pectoris: Secondary | ICD-10-CM | POA: Diagnosis not present

## 2021-08-24 DIAGNOSIS — I5032 Chronic diastolic (congestive) heart failure: Secondary | ICD-10-CM

## 2021-08-24 DIAGNOSIS — I700 Atherosclerosis of aorta: Secondary | ICD-10-CM | POA: Diagnosis not present

## 2021-08-24 DIAGNOSIS — R600 Localized edema: Secondary | ICD-10-CM

## 2021-08-24 DIAGNOSIS — I10 Essential (primary) hypertension: Secondary | ICD-10-CM | POA: Diagnosis not present

## 2021-08-24 DIAGNOSIS — G4733 Obstructive sleep apnea (adult) (pediatric): Secondary | ICD-10-CM

## 2021-08-24 DIAGNOSIS — M5136 Other intervertebral disc degeneration, lumbar region: Secondary | ICD-10-CM

## 2021-08-24 DIAGNOSIS — N1832 Chronic kidney disease, stage 3b: Secondary | ICD-10-CM

## 2021-08-24 DIAGNOSIS — K922 Gastrointestinal hemorrhage, unspecified: Secondary | ICD-10-CM

## 2021-08-24 DIAGNOSIS — M1710 Unilateral primary osteoarthritis, unspecified knee: Secondary | ICD-10-CM | POA: Diagnosis not present

## 2021-08-24 DIAGNOSIS — I480 Paroxysmal atrial fibrillation: Secondary | ICD-10-CM | POA: Diagnosis not present

## 2021-08-24 DIAGNOSIS — D509 Iron deficiency anemia, unspecified: Secondary | ICD-10-CM | POA: Diagnosis not present

## 2021-08-24 DIAGNOSIS — E7800 Pure hypercholesterolemia, unspecified: Secondary | ICD-10-CM

## 2021-08-24 DIAGNOSIS — R7989 Other specified abnormal findings of blood chemistry: Secondary | ICD-10-CM

## 2021-08-24 DIAGNOSIS — D6859 Other primary thrombophilia: Secondary | ICD-10-CM

## 2021-08-24 DIAGNOSIS — J449 Chronic obstructive pulmonary disease, unspecified: Secondary | ICD-10-CM

## 2021-08-24 DIAGNOSIS — M51369 Other intervertebral disc degeneration, lumbar region without mention of lumbar back pain or lower extremity pain: Secondary | ICD-10-CM

## 2021-08-24 DIAGNOSIS — R0602 Shortness of breath: Secondary | ICD-10-CM

## 2021-08-24 DIAGNOSIS — K219 Gastro-esophageal reflux disease without esophagitis: Secondary | ICD-10-CM

## 2021-08-24 DIAGNOSIS — E1122 Type 2 diabetes mellitus with diabetic chronic kidney disease: Secondary | ICD-10-CM

## 2021-08-24 LAB — BASIC METABOLIC PANEL
BUN: 22 mg/dL (ref 6–23)
CO2: 28 mEq/L (ref 19–32)
Calcium: 8.5 mg/dL (ref 8.4–10.5)
Chloride: 103 mEq/L (ref 96–112)
Creatinine, Ser: 1.75 mg/dL — ABNORMAL HIGH (ref 0.40–1.20)
GFR: 26.74 mL/min — ABNORMAL LOW (ref >60.00)
Glucose, Bld: 119 mg/dL — ABNORMAL HIGH (ref 70–99)
Potassium: 3.6 mEq/L (ref 3.5–5.1)
Sodium: 139 mEq/L (ref 135–145)

## 2021-08-24 LAB — CBC WITH DIFFERENTIAL/PLATELET
Basophils Absolute: 0 10*3/uL (ref 0.0–0.1)
Basophils Relative: 0.6 % (ref 0.0–3.0)
Eosinophils Absolute: 0.1 10*3/uL (ref 0.0–0.7)
Eosinophils Relative: 1.6 % (ref 0.0–5.0)
HCT: 32.4 % — ABNORMAL LOW (ref 36.0–46.0)
Hemoglobin: 10.5 g/dL — ABNORMAL LOW (ref 12.0–15.0)
Lymphocytes Relative: 26.7 % (ref 12.0–46.0)
Lymphs Abs: 2 10*3/uL (ref 0.7–4.0)
MCHC: 32.4 g/dL (ref 30.0–36.0)
MCV: 92.6 fl (ref 78.0–100.0)
Monocytes Absolute: 1.4 10*3/uL — ABNORMAL HIGH (ref 0.1–1.0)
Monocytes Relative: 18.8 % — ABNORMAL HIGH (ref 3.0–12.0)
Neutro Abs: 3.9 10*3/uL (ref 1.4–7.7)
Neutrophils Relative %: 52.3 % (ref 43.0–77.0)
Platelets: 196 10*3/uL (ref 150.0–400.0)
RBC: 3.49 Mil/uL — ABNORMAL LOW (ref 3.87–5.11)
RDW: 16.3 % — ABNORMAL HIGH (ref 11.5–15.5)
WBC: 7.4 10*3/uL (ref 4.0–10.5)

## 2021-08-24 LAB — IBC + FERRITIN
Ferritin: 24.9 ng/mL (ref 10.0–291.0)
Iron: 79 ug/dL (ref 42–145)
Saturation Ratios: 23.4 % (ref 20.0–50.0)
TIBC: 337.4 ug/dL (ref 250.0–450.0)
Transferrin: 241 mg/dL (ref 212.0–360.0)

## 2021-08-24 LAB — HEPATIC FUNCTION PANEL
ALT: 28 U/L (ref 0–35)
AST: 54 U/L — ABNORMAL HIGH (ref 0–37)
Albumin: 3.1 g/dL — ABNORMAL LOW (ref 3.5–5.2)
Alkaline Phosphatase: 99 U/L (ref 39–117)
Bilirubin, Direct: 0.2 mg/dL (ref 0.0–0.3)
Total Bilirubin: 0.8 mg/dL (ref 0.2–1.2)
Total Protein: 6 g/dL (ref 6.0–8.3)

## 2021-08-24 NOTE — Patient Instructions (Signed)
Stop protonix.  ? ?Take pepcid '20mg'$  - 30 minutes before your evening meal.  ?

## 2021-08-24 NOTE — Progress Notes (Signed)
Patient ID: April [name], female   DOB: 1941-01-16, 81 y.o.   MRN: [id number] ? ? ?Subjective:  ? ? Patient ID: April [name], female    DOB: 1940-11-26, 81 y.o.   MRN: [id number] ? ?Patient here for hospital follow up.  ? ?Chief Complaint  ?Patient presents with  ? Hospitalization Follow-up  ? .  ? ?HPI ?Admitted 08/08/21 due to worsening shortness of breath, weight gain and abdominal swelling. Initially given IV lasix with transition to oral diuretics. Cardiology and palliative care consults obtained. Given 1 unit PRBC's due to anemia. Hypokalemia corrected. OT/PT evaluations done. Discharged after 8 days. Saw Darylene Price for f/u CHF clinic.  No changes made.  Currently maintained on torsemide '40mg'$  alternating with '20mg'$  qod.  Lower extremity swelling is still present, but has improved.  Breathing is better.  Still with fatigue - with exertion.  No increased cough or wheezing.  Discussed PT.  Initial evaluation tomorrow.  She requested rx for rollator walker with seat.  Some nausea yesterday.  Is eating.  Discussed low sodium diet.   ? ? ?Past Medical History:  ?Diagnosis Date  ? Anemia   ? CAD S/P PCI pRCA Promus DES 3.5 x 16 (4.1 mm), ostRPDA Promus DES 2.5 x 12 (2.7 mm) 03/25/2016  ? CAD S/P percutaneous coronary angioplasty   ? a. 07/2015 MV: No ischemia, EF 66%;  b. 03/2016 Inflat STEMI/PCI: LM nl, LAD 25d, RI nl, LCX nl, OM1/2 nl, RCA 80p (3.5x16 Promus Premier DES), 50p/m, 30d, RPDA 90 (2.5x12 Promu Premier DES); c. 01/2018 Cath ([place], [place]): Patent RCA stents->Med Rx.  ? CHF (congestive heart failure) ([place])   ? CKD (chronic kidney disease), stage III ([place])   ? Diabetes mellitus without complication ([place])   ? Diastolic dysfunction   ? a. 10/2013 Echo: EF 55-65%, Gr1 DD; b. 01/2018 Echo ([place], [place]): EF 55-60%, Gr2 DD, RVSP 41mHg.  ? Diverticulitis   ? Dyspnea   ? Endometriosis   ? Family history of adverse reaction to anesthesia   ? Mother - had to stay in ICU because of  breathing difficulties every time she had anesthesia  ? GERD (gastroesophageal reflux disease)   ? GI bleed   ? a. 06/2021 - melena-->2 u PRBCs.  ? Hiatal hernia   ? History of colon polyps 08/1994  ? History of Migraines   ? Hypercholesterolemia   ? Hypertension   ? LBBB (left bundle branch block)   ? Osteoporosis   ? PAF (paroxysmal atrial fibrillation) ([place]   ? a. 03/2016 Dx @ time of MI; b. CHA2DS2VASc = 5-->Eliquis.  ? Rheumatic fever   ? Sleep apnea   ? CPAP  ? Spasmodic dysphonia   ? ST elevation myocardial infarction (STEMI) of inferolateral wall, initial episode of care ([facility] 03/25/2016  ? ST elevation myocardial infarction (STEMI) of inferolateral wall, initial episode of care ([facility] 03/25/2016  ? Urine incontinence   ? ?Past Surgical History:  ?Procedure Laterality Date  ? ABDOMINAL HYSTERECTOMY    ? ovaries not removed  ? APPENDECTOMY    ? was removed during hysterectomy  ? Breast biopsies    ? x2  ? BREAST EXCISIONAL BIOPSY Bilateral "years ago"  ? neg  ? BREAST SURGERY Bilateral   ? BROW LIFT Bilateral 08/15/2019  ? Procedure: BLEPHAROPLASTY UPPER EYELID; W/EXCESS SKIN BLEPHAROPTOSIS REPAIR; RESECT EX;  Surgeon: FKarle Starch MD;  [place]  Service: Ophthalmology;  Laterality: Bilateral;  sleep apnea  ? CARDIAC CATHETERIZATION  2011  ? moderate 40% RCA disease  ? CARDIAC CATHETERIZATION  01/2010  ? Dr. Gollan'@[facility]'$ : Only noted 40% RCA  ? CARDIAC CATHETERIZATION N/A 03/25/2016  ? Procedure: Left Heart Cath and Coronary Angiography;  Surgeon: 04/07/2016, MD;  Location: [place] CV LAB;  Service: Cardiovascular;  Laterality: N/A;  ? CARDIAC CATHETERIZATION N/A 03/25/2016  ? Procedure: Coronary Stent Intervention;  Surgeon: 04/07/2016, MD;  Location: [place] CV LAB;  Service: Cardiovascular;  Laterality: N/A;  ? COLONOSCOPY  2013  ? COLONOSCOPY WITH PROPOFOL N/A 07/11/2021  ? Procedure: COLONOSCOPY WITH PROPOFOL;  Surgeon: 07/24/2021, MD;  Location: [facility] ENDOSCOPY;   Service: Endoscopy;  Laterality: N/A;  ? ESOPHAGOGASTRODUODENOSCOPY (EGD) WITH PROPOFOL N/A 03/17/2015  ? Procedure: ESOPHAGOGASTRODUODENOSCOPY (EGD) WITH PROPOFOL;  Surgeon: 03/30/2015, MD;  Location: [facility] ENDOSCOPY;  Service: Gastroenterology;  Laterality: N/A;  ? ESOPHAGOGASTRODUODENOSCOPY (EGD) WITH PROPOFOL N/A 07/10/2021  ? Procedure: ESOPHAGOGASTRODUODENOSCOPY (EGD) WITH PROPOFOL;  Surgeon: 16/03/2022, MD;  Location: [facility] ENDOSCOPY;  Service: Gastroenterology;  Laterality: N/A;  ? GIVENS CAPSULE STUDY N/A 07/11/2021  ? Procedure: GIVENS CAPSULE STUDY;  Surgeon: 07/24/2021, MD;  Location: [facility] ENDOSCOPY;  Service: Endoscopy;  Laterality: N/A;  ? NM MYOVIEW ([place] HX)  07/2015  ? No evidence ischemia or infarction. EF 66%. Low risk  ? TONSILLECTOMY    ? TRANSTHORACIC ECHOCARDIOGRAM  10/2013  ? Normal LV size and function. EF 55-65%. GR 1 DD. Otherwise normal.  ? ?Family History  ?Problem Relation Age of Onset  ? Arthritis Mother   ? Stroke Mother   ? Hypertension Mother   ? Osteoporosis Mother   ? Heart disease Father   ?     MI  ? Heart attack Father   ? Stroke Brother   ? Heart attack Sister   ? Breast cancer Other   ?     first cousin x 2  ? Stroke Daughter   ? Heart attack Daughter   ? Colon cancer Neg Hx   ? ?Social History  ? ?Socioeconomic History  ? Marital status: Widowed  ?  Spouse name: Not on file  ? Number of children: 2  ? Years of education: Not on file  ? Highest education level: Not on file  ?Occupational History  ? Not on file  ?Tobacco Use  ? Smoking status: Never  ? Smokeless tobacco: Never  ?Vaping Use  ? Vaping Use: Never used  ?Substance and Sexual Activity  ? Alcohol use: No  ?  Alcohol/week: 0.0 standard drinks  ? Drug use: No  ? Sexual activity: Not Currently  ?  Birth control/protection: Post-menopausal  ?Other Topics Concern  ? Not on file  ?Social History Narrative  ? Not on file  ? ?Social Determinants of Health  ? ?Financial Resource Strain: Not on file  ?Food  Insecurity: Not on file  ?Transportation Needs: Not on file  ?Physical Activity: Not on file  ?Stress: Not on file  ?Social Connections: Not on file  ? ? ? ?Review of Systems  ?Constitutional:  Positive for fatigue. Negative for appetite change and unexpected weight change.  ?HENT:  Negative for congestion and sinus pressure.   ?Respiratory:  Positive for shortness of breath. Negative for cough and chest tightness.   ?Cardiovascular:  Positive for leg swelling. Negative for chest pain and palpitations.  ?Gastrointestinal:  Positive for nausea. Negative for abdominal pain, diarrhea and vomiting.  ?Genitourinary:  Negative for difficulty  urinating and dysuria.  ?Musculoskeletal:  Negative for joint swelling and myalgias.  ?Skin:  Negative for color change and rash.  ?Neurological:  Negative for dizziness, light-headedness and headaches.  ?Psychiatric/Behavioral:  Negative for agitation and dysphoric mood.   ? ?   ?Objective:  ?  ? ?BP (!) 108/58 (BP Location: Left Arm, Patient Position: Sitting, Cuff Size: Large)   Pulse (!) 53   Temp (!) 97.5 ?F (36.4 ?C) (Oral)   Ht '4\' 11"'$  (1.499 m)   Wt 189 lb (85.7 kg)   LMP 05/01/1972   SpO2 98%   BMI 38.17 kg/m?  ?Wt Readings from Last 3 Encounters:  ?08/24/21 189 lb (85.7 kg)  ?08/22/21 187 lb (84.8 kg)  ?08/16/21 195 lb 8.8 oz (88.7 kg)  ? ? ?Physical Exam ?Vitals reviewed.  ?Constitutional:   ?   General: She is not in acute distress. ?   Appearance: Normal appearance.  ?HENT:  ?   Head: Normocephalic and atraumatic.  ?   Right Ear: External ear normal.  ?   Left Ear: External ear normal.  ?Eyes:  ?   General: No scleral icterus.    ?   Right eye: No discharge.     ?   Left eye: No discharge.  ?   Conjunctiva/sclera: Conjunctivae normal.  ?Neck:  ?   Thyroid: No thyromegaly.  ?Cardiovascular:  ?   Rate and Rhythm: Normal rate and regular rhythm.  ?Pulmonary:  ?   Effort: No respiratory distress.  ?   Breath sounds: Normal breath sounds. No wheezing.  ?Abdominal:  ?    General: Bowel sounds are normal.  ?   Palpations: Abdomen is soft.  ?   Tenderness: There is no abdominal tenderness.  ?Musculoskeletal:     ?   General: No swelling or tenderness.  ?   Cervical back: Neck suppl

## 2021-08-25 ENCOUNTER — Telehealth: Payer: Self-pay | Admitting: Student

## 2021-08-25 DIAGNOSIS — Z9181 History of falling: Secondary | ICD-10-CM | POA: Diagnosis not present

## 2021-08-25 DIAGNOSIS — I5032 Chronic diastolic (congestive) heart failure: Secondary | ICD-10-CM | POA: Diagnosis not present

## 2021-08-25 DIAGNOSIS — R2689 Other abnormalities of gait and mobility: Secondary | ICD-10-CM | POA: Diagnosis not present

## 2021-08-25 DIAGNOSIS — M6281 Muscle weakness (generalized): Secondary | ICD-10-CM | POA: Diagnosis not present

## 2021-08-25 DIAGNOSIS — R262 Difficulty in walking, not elsewhere classified: Secondary | ICD-10-CM | POA: Diagnosis not present

## 2021-08-25 DIAGNOSIS — R279 Unspecified lack of coordination: Secondary | ICD-10-CM | POA: Diagnosis not present

## 2021-08-25 DIAGNOSIS — I480 Paroxysmal atrial fibrillation: Secondary | ICD-10-CM | POA: Diagnosis not present

## 2021-08-25 DIAGNOSIS — G20 Parkinson's disease: Secondary | ICD-10-CM | POA: Diagnosis not present

## 2021-08-25 NOTE — Telephone Encounter (Signed)
Spoke with patient regarding the Palliative referral/services and she has declined Palliative services at this time.  I told patient if things changed and she needed Palliative services in the future to please let April Price, NP know so that she could send [place] another referral and she was in agreement with this. ?

## 2021-08-28 NOTE — Progress Notes (Signed)
?Cardiology Office Note:   ? ?Date:  08/29/2021  ? ?ID:  April [name], DOB Dec 08, 1940, MRN [id number] ? ?PCP:  Einar Pheasant, MD  ?[place] Cardiologist:  Ida Rogue, MD  ?[facility] Electrophysiologist:  None  ? ?Referring MD: Einar Pheasant, MD  ? ?Chief Complaint: Hospital follow-up ? ?History of Present Illness:   ? ?April [name] is a 81 y.o. female with a hx of CAD s/p prior RCA and pDA stenting, HFpEF, PAF on Eliquis, anemia with recurrent GI bleeding, CKD stage III, HTN, HLD, and sleep apnea who is being seen today for the evaluation of volume overload. ? ?April [name] was admitted to the hospital in 01/2018 in [place] with chest discomfort and mild troponin elevation.  Echo demonstrated normal LV systolic function with grade 2 diastolic dysfunction.  LHC showed patent RCA stents and otherwise no targets for intervention.  Over the years, she has had issues with chronic lower extremity swelling and lymphedema for which she has been using wraps and lymphedema pumps. ?  ?She was admitted to [facility] in 06/2021 with symptomatic anemia with melena. HGB dropped to 7.2.  She required 2 units pRBC and Eliquis was held. EGD was performed and showed mildly erythematous gastric mucosa without obvious bleeding.  This was followed by colonoscopy which showed no source of bleeding, polyps (pathology negative for malignancy), nonbleeding internal hemorrhoids, and diverticulosis.Capsule endoscopy was subsequently carried out.  EGD biopsies were negative for H. pylori. She was discharged home off of Eliquis. She was seen by GI on 07/21/2021, at which time H&H were stable. Note indicates that she was cleared to resume anticoagulation.  ?  ?She was seen in our office on 07/26/2021, and was noted to be volume up with associated lower extremity swelling and dyspnea.  She reported a 10 pound weight gain.  It was noted she remained net positive throughout her prior admission in the context of IV fluids, PRBC, and  holding of diuretic therapy because of AKI.  She had been alternating Lasix 40 mg daily with 40 mg twice daily without significant improvement in symptoms.  In an effort to attempt to prevent readmission, her Lasix was titrated for 3 days to 80 mg twice daily followed by 40 mg twice daily thereafter.  She was restarted on Eliquis 5 mg twice daily.  She was treated for lower extremity cellulitis as well. ?  ?She was admitted to [facility] earlier this month with recurrent melena as well as a small amount of blood-streaked reflux leading her Eliquis to be held at that time. She was evaluated by GI and underwent enteroscopy and capsule endoscopy which showed a large hiatal hernia, patchy erythematous and petechial mucosa with erosion in the stomach, the duodenum, and jejunum appeared normal.  H. pylori stool antigen was negative. GI subsequently cleared the patient to resume Eliquis, though this was held at discharge, at patient request. She contacted our office on 4/6 concerned about this, after discussion with her primary cardiologist, it was recommended she resume Eliquis at 2.5 mg bid (now met reduced dosing criteria), and hold ASA.  ?  ?She was admitted to [facility] on 08/08/2021-4/17 with acute on chronic HFpEF, recurrent melena, and anemia.  She reported an approximate 15 to 20 pound weight gain.  High sensitivity troponin 15 with a delta troponin of 16. BNP 1032. HGB 8.2, which is similar to her discharge value at [facility] of 8.8. BUN/SCr 29/1.87-29/1.79.  EKG showed sinus bradycardia with known left bundle branch block.  Chest x-ray showed mild pulmonary interstitial edema with new small bilateral pleural effusions as well as a stable large hiatal hernia.  In the ED, she received IV Lasix 40 mg and was placed on IV Lasix 60 mg daily upon admission. Net -11.5L during admission. She was transfused 2 units PRBCs . Remained in NSR with amiodarone. She was d/c'd on torsemide 40 in the AM and and 20 in the  pm. ? ?Today the patient reports she has been feeling weak since being discharged. She will start PT tomorrow. She is at facility, [facility], independent living. She is by herself. She is using a cane, may get a rollator. No chest pain. Has persistent SOB. Had some atypical sharp chest pain on the left flank. Has swelling on her feet. They have been persistently swollen. Has a history of lymphedema, followed by Vascular. She used to wear [place] boots. Cannot get on compression socks. She eats low salt diet. EKG shows SB with first degree AV block. She says she is on propranolol 57m TID and amiodarone 2061mdaily. She denies any bleeding. Recent labs reviewed.  ? ? ?Past Medical History:  ?Diagnosis Date  ? Anemia   ? CAD S/P PCI pRCA Promus DES 3.5 x 16 (4.1 mm), ostRPDA Promus DES 2.5 x 12 (2.7 mm) 03/25/2016  ? CAD S/P percutaneous coronary angioplasty   ? a. 07/2015 MV: No ischemia, EF 66%;  b. 03/2016 Inflat STEMI/PCI: LM nl, LAD 25d, RI nl, LCX nl, OM1/2 nl, RCA 80p (3.5x16 Promus Premier DES), 50p/m, 30d, RPDA 90 (2.5x12 Promu Premier DES); c. [place] RCA st[place]->Med Rx.  ? CHF (congestive heart failure) ([place]  ? CKD (chronic kidney disease), stage III ([place]  ? Diabetes mellitus without complication ([place]  ? Diastolic dysfunction   ? a. 10/2013 Echo: EF 55-65%, Gr1 DD; b. [place]/2018 [place] EF 55-60%, Gr2 DD, RVSP 4115m.  ? Diverticulitis   ? Dyspnea   ? Endometriosis   ? Family history of adverse reaction to anesthesia   ? Mother - had to stay in ICU because of breathing difficulties every time she had anesthesia  ? GERD (gastroesophageal reflux disease)   ? GI bleed   ? a. 06/2021 - melena-->2 u PRBCs.  ? Hiatal hernia   ? History of colon polyps 08/1994  ? History of Migraines   ? Hypercholesterolemia   ? Hypertension   ? LBBB (left bundle branch block)   ? Osteoporosis   ? PAF (paroxysmal atrial fibrillation) ([place] ? a. 03/2016 Dx @  time of MI; b. CHA2DS2VASc = 5-->Eliquis.  ? Rheumatic fever   ? Sleep apnea   ? CPAP  ? Spasmodic dysphonia   ? ST elevation myocardial infarction (STEMI) of inferolateral wall[facility]  ? ST elevation myocardial infarction (STEMI) of inferolateral wall[facility]  ? Urine incontinence   ? ? ?Past Surgical History:  ?Procedure Laterality Date  ? ABDOMINAL HYSTERECTOMY    ? ovaries not removed  ? APPENDECTOMY    ? was removed during hysterectomy  ? Breast biopsies    ? x2  ? BREAST EXCISIONAL BIOPSY Bilateral "years ago"  ? neg  ? BREAST SURGERY Bilateral   ? BROW LIFT Bilateral 08/15/2019  ? Procedure: BLEPHAROPLASTY UPPER EYELID; W/EXCESS SKIN BLEPHAROPTOSIS REPAIR; RESECT EX;  Surgeon: FowKarle StarchD;  [place]  [place];  Laterality: Bilateral;  sleep apnea  ? CARDIAC CATHETERIZATION  2011  ? moderate 40% RCA disease  ? CARDIAC CATHETERIZATION  01/2010  ? Dr. Suzie Portela : Only noted 40% RCA  ? CARDIAC CATHETERIZATION N/A 03/25/2016  ? Procedure: Left Heart Cath and Coronary Angiography;  Surgeon: Leonie Man, MD;  Location: [place] CV LAB;  Service: Cardiovascular;  Laterality: N/A;  ? CARDIAC CATHETERIZATION N/A 03/25/2016  ? Procedure: Coronary Stent Intervention;  Surgeon: Leonie Man, MD;  Location: [place] CV LAB;  Service: Cardiovascular;  Laterality: N/A;  ? COLONOSCOPY  2013  ? COLONOSCOPY WITH PROPOFOL N/A 07/11/2021  ? Procedure: COLONOSCOPY WITH PROPOFOL;  Surgeon: Lucilla Lame, MD;  Location: [facility] ENDOSCOPY;  Service: Endoscopy;  Laterality: N/A;  ? ESOPHAGOGASTRODUODENOSCOPY (EGD) WITH PROPOFOL N/A 03/17/2015  ? Procedure: ESOPHAGOGASTRODUODENOSCOPY (EGD) WITH PROPOFOL;  Surgeon: Christene Lye, MD;  Location: [facility] ENDOSCOPY;  Service: Gastroenterology;  Laterality: N/A;  ? ESOPHAGOGASTRODUODENOSCOPY (EGD) WITH PROPOFOL N/A 07/10/2021  ? Procedure: ESOPHAGOGASTRODUODENOSCOPY  (EGD) WITH PROPOFOL;  Surgeon: Lin Landsman, MD;  Location: [facility] ENDOSCOPY;  Service: Gastroenterology;  Laterality: N/A;  ? GIVENS CAPSULE STUDY N/A 07/11/2021  ? Procedure: GIVENS CAPSULE STUDY;  Surgeon: Jerrilyn Cairo

## 2021-08-29 ENCOUNTER — Encounter: Payer: Self-pay | Admitting: Medical

## 2021-08-29 ENCOUNTER — Telehealth: Payer: Self-pay | Admitting: Medical

## 2021-08-29 ENCOUNTER — Other Ambulatory Visit
Admission: RE | Admit: 2021-08-29 | Discharge: 2021-08-29 | Disposition: A | Discharge status: Home / Self Care | Payer: Medicare Other | Attending: Medical | Admitting: Medical

## 2021-08-29 ENCOUNTER — Encounter: Payer: Self-pay | Admitting: Internal Medicine

## 2021-08-29 ENCOUNTER — Ambulatory Visit (INDEPENDENT_AMBULATORY_CARE_PROVIDER_SITE_OTHER): Payer: Medicare Other | Admitting: Medical

## 2021-08-29 VITALS — BP 110/50 | HR 54 | Ht 59.0 in | Wt 188.0 lb

## 2021-08-29 DIAGNOSIS — N183 Chronic kidney disease, stage 3 unspecified: Secondary | ICD-10-CM

## 2021-08-29 DIAGNOSIS — N1832 Chronic kidney disease, stage 3b: Secondary | ICD-10-CM

## 2021-08-29 DIAGNOSIS — N170 Acute kidney failure with tubular necrosis: Secondary | ICD-10-CM | POA: Diagnosis not present

## 2021-08-29 DIAGNOSIS — Z9861 Coronary angioplasty status: Secondary | ICD-10-CM

## 2021-08-29 DIAGNOSIS — I2510 Atherosclerotic heart disease of native coronary artery without angina pectoris: Secondary | ICD-10-CM | POA: Diagnosis not present

## 2021-08-29 DIAGNOSIS — I5032 Chronic diastolic (congestive) heart failure: Secondary | ICD-10-CM | POA: Diagnosis not present

## 2021-08-29 DIAGNOSIS — I480 Paroxysmal atrial fibrillation: Secondary | ICD-10-CM

## 2021-08-29 LAB — BRAIN NATRIURETIC PEPTIDE: B Natriuretic Peptide: 946.1 pg/mL — ABNORMAL HIGH (ref 0.0–100.0)

## 2021-08-29 MED ORDER — TORSEMIDE 20 MG PO TABS: ORAL_TABLET | ORAL | 5 refills | Status: DC

## 2021-08-29 NOTE — Telephone Encounter (Signed)
Faxed referral [facility] notes with labs to [facility] .  ? ? ?

## 2021-08-29 NOTE — Assessment & Plan Note (Signed)
Continue cpap.  

## 2021-08-29 NOTE — Assessment & Plan Note (Signed)
Afib.  Off eliquis due to bleeding.  Has f/u planned with cardiology Monday 08/29/21.  ?

## 2021-08-29 NOTE — Assessment & Plan Note (Signed)
Continue protonix  

## 2021-08-29 NOTE — Assessment & Plan Note (Addendum)
Off anticoagulation due to recent admissions for GI bleed.  Colonoscopy 07/11/21 - tubular adenoma, diverticulosis and non bleeding internal hemorrhoid.  (no f/u unless needed). EGD - negative H. Pylori. mildly erythematous gastric mucosa with no obvious bleeding. Follow cbc. No bleeding reported since last discharge.  ?

## 2021-08-29 NOTE — Patient Instructions (Signed)
Medication Instructions:  ?Your physician has recommended you make the following change in your medication:  ? ?STOP Propanolol ? ?INCREASE Torsemide to 40 mg twice a day ? ?*If you need a refill on your cardiac medications before your next appointment, please call your pharmacy* ? ? ?Lab Work: ?BNP today ? ?Please have your lab drawn at the [facility]. Stop at the Registration desk to check in ? ?If you have labs (blood work) drawn today and your tests are completely normal, you will receive your results only by: ?MyChart Message (if you have MyChart) OR ?A paper copy in the mail ?If you have any lab test that is abnormal or we need to change your treatment, we will call you to review the results. ? ? ?Testing/Procedures: ?None ordered ? ? ?Follow-Up: ?At [facility], you and your health needs are our priority.  As part of our continuing mission to provide you with exceptional heart care, we have created designated Provider Care Teams.  These Care Teams include your primary Cardiologist (physician) and Advanced Practice Providers (APPs -  Physician Assistants and Nurse Practitioners) who all work together to provide you with the care you need, when you need it. ? ?We recommend signing up for the patient portal called "MyChart".  Sign up information is provided on this After Visit Summary.  MyChart is used to connect with patients for Virtual Visits (Telemedicine).  Patients are able to view lab/test results, encounter notes, upcoming appointments, etc.  Non-urgent messages can be sent to your provider as well.   ?To learn more about what you can do with MyChart, go to [email or web address].   ? ?Your next appointment:   ?1 week(s) ? ?The format for your next appointment:   ?In Person ? ?Provider:   ?Tarri Glenn, PA-C  ? ? ? ? ?Other Instructions ?You have been referred to EP for Watchman consideration ? ?You have been referred to [place]. Their office will contact you to schedule.   ? ? ?Important Information About [place] ? ? ? ? ? ? ?

## 2021-08-29 NOTE — Assessment & Plan Note (Addendum)
Recent admission for GI bleed - anemia.  Transfused.  Follow cbc. No further bleeding.  No obvious source identified.  Check cbc today.  ?

## 2021-08-29 NOTE — Assessment & Plan Note (Signed)
Breathing overall better with diuresis.  No increased cough.  Follow.   

## 2021-08-29 NOTE — Assessment & Plan Note (Signed)
Breathing is some better.  Weight is down some after diuresis.  Continue torsemide.  Discussed possibly starting entrestro.  Check metabolic panel.  Keep f/u with cardiology on 08/29/21 to discussed.  Cardiology updated.  ?

## 2021-08-29 NOTE — Assessment & Plan Note (Signed)
On crestor.  Low cholesterol diet and exercise.  Follow lipid panel and liver function tests.   

## 2021-08-29 NOTE — Assessment & Plan Note (Signed)
Has been on eliquis and amiodarone.  Off eliquis recently secondary to GI bleed.  Followed by cardiology.  ?

## 2021-08-29 NOTE — Assessment & Plan Note (Signed)
Recheck liver panel today.  

## 2021-08-29 NOTE — Assessment & Plan Note (Signed)
Low carb diet and exercise.  Follow met b and a1c.  ?

## 2021-08-29 NOTE — Assessment & Plan Note (Signed)
Continue crestor 

## 2021-08-29 NOTE — Assessment & Plan Note (Addendum)
No chest pain - continues hydralazine. Off eliquis due to GI bleed.  Breathing is better after IV lasix.  Continues on torsemide.   Check metabolic panel today.  ?

## 2021-08-29 NOTE — Assessment & Plan Note (Signed)
Bilateral lower extremity swelling as outlined.  Has improved some.  Wanted a break from [place] boots.   ?

## 2021-08-29 NOTE — Assessment & Plan Note (Addendum)
Chronic lower extremity swelling.  Unable to wear compression hose.  Wanted a break from [place] boots.  Ace wraps.  Weight as outlined.  Taking torsemide as directed. Discussed the possibility of starting entrestro.  Has f/u with cardiology 08/29/21.  Check metabolic panel today.  D/w cardiology.  Discussed importance of daily weights and low sodium.  Follow. Difficulty walking.  Request rx for rollator walker with seat.  rx given. Starting PT tomorrow.  ?

## 2021-08-29 NOTE — Assessment & Plan Note (Signed)
Blood pressure on recheck improved 120/62.  Continues torsemide and hydralazine.  Follow pressures.  Check metabolic panel today.  ?

## 2021-08-30 ENCOUNTER — Telehealth: Payer: Self-pay

## 2021-08-30 DIAGNOSIS — R279 Unspecified lack of coordination: Secondary | ICD-10-CM | POA: Diagnosis not present

## 2021-08-30 DIAGNOSIS — I5032 Chronic diastolic (congestive) heart failure: Secondary | ICD-10-CM | POA: Diagnosis not present

## 2021-08-30 DIAGNOSIS — I480 Paroxysmal atrial fibrillation: Secondary | ICD-10-CM | POA: Diagnosis not present

## 2021-08-30 DIAGNOSIS — M6281 Muscle weakness (generalized): Secondary | ICD-10-CM | POA: Diagnosis not present

## 2021-08-30 DIAGNOSIS — G20 Parkinson's disease: Secondary | ICD-10-CM | POA: Diagnosis not present

## 2021-08-30 DIAGNOSIS — Z9181 History of falling: Secondary | ICD-10-CM | POA: Diagnosis not present

## 2021-08-30 DIAGNOSIS — R262 Difficulty in walking, not elsewhere classified: Secondary | ICD-10-CM | POA: Diagnosis not present

## 2021-08-30 DIAGNOSIS — R2689 Other abnormalities of gait and mobility: Secondary | ICD-10-CM | POA: Diagnosis not present

## 2021-08-30 NOTE — Telephone Encounter (Signed)
-----   Message from [place], PA-C sent at 08/30/2021  8:36 AM EDT ----- ?Labs showed she is volume up on exam. We increase her torsemide from '40mg'$  in the am/20 in the pm to '40mg'$  BID. Can we instruct her to go up to '60mg'$ BID for 3 days, then back down to '40mg'$  BID. Plan is to see her in a week. Thanks ?

## 2021-08-30 NOTE — Telephone Encounter (Signed)
Patient made aware of lab results and Cadence Furth, PA recommendation with verbalized understanding. 

## 2021-09-01 ENCOUNTER — Telehealth: Payer: Self-pay | Admitting: Internal Medicine

## 2021-09-01 ENCOUNTER — Ambulatory Visit: Payer: Medicare Other | Admitting: Physician Assistant

## 2021-09-01 DIAGNOSIS — I5032 Chronic diastolic (congestive) heart failure: Secondary | ICD-10-CM | POA: Diagnosis not present

## 2021-09-01 DIAGNOSIS — Z9181 History of falling: Secondary | ICD-10-CM | POA: Diagnosis not present

## 2021-09-01 DIAGNOSIS — M6281 Muscle weakness (generalized): Secondary | ICD-10-CM | POA: Diagnosis not present

## 2021-09-01 DIAGNOSIS — R279 Unspecified lack of coordination: Secondary | ICD-10-CM | POA: Diagnosis not present

## 2021-09-01 DIAGNOSIS — R262 Difficulty in walking, not elsewhere classified: Secondary | ICD-10-CM | POA: Diagnosis not present

## 2021-09-01 DIAGNOSIS — R2689 Other abnormalities of gait and mobility: Secondary | ICD-10-CM | POA: Diagnosis not present

## 2021-09-01 NOTE — Telephone Encounter (Signed)
-----   Message from [place], PA-C sent at 08/27/2021 11:10 AM EDT ----- ?Regarding: RE: question and update ?Hi, thank you for your message. I think based off the recent labs I would leave torsemide as is and maybe start Entresto.  ? ?----- Message ----- ?From: Einar Pheasant, MD ?Sent: 08/26/2021   5:42 AM EDT ?To: [name], PA-C ?Subject: question and update                           ? ?[name] is scheduled to see you for a follow up appointment Monday 08/29/21.  I just saw her for a hospital follow up.  She was admitted 08/08/21 due to worsening shortness of breath, weight gain and abdominal swelling. Initially given IV lasix with transition to oral diuretics. Cardiology and palliative care consults obtained. Given 1 unit PRBC's due to anemia. Hypokalemia corrected. OT/PT evaluations done. Discharged after 8 days. Her breathing is better.  Still with increased fatigue with exertion.  Still with increased lower extremity swelling, but has improved.  (Still significant swelling, but this is chronic and has improved).  Currently on torsemide '40mg'$  alternating with '20mg'$  qod. I discussed with her regarding starting entresto.  Wanted to check her labs and wanted her to discuss with you at her appt on Monday.  Just got her labs back and her GFR has decreased. GFR currently 26.  Discharged GFR 36.  I was going to adjust her torsemide, but wanted to get your input prior to changing medication.  Thank you for seeing her and helping take care of her.  I really appreciate it.   ? ?Thank you ?Javione Gunawan  ? ? ?

## 2021-09-03 DIAGNOSIS — Z20822 Contact with and (suspected) exposure to covid-19: Secondary | ICD-10-CM | POA: Diagnosis not present

## 2021-09-05 DIAGNOSIS — J849 Interstitial pulmonary disease, unspecified: Secondary | ICD-10-CM | POA: Diagnosis not present

## 2021-09-05 DIAGNOSIS — R0602 Shortness of breath: Secondary | ICD-10-CM | POA: Diagnosis not present

## 2021-09-05 DIAGNOSIS — Z20822 Contact with and (suspected) exposure to covid-19: Secondary | ICD-10-CM | POA: Diagnosis not present

## 2021-09-05 DIAGNOSIS — I130 Hypertensive heart and chronic kidney disease with heart failure and stage 1 through stage 4 chronic kidney disease, or unspecified chronic kidney disease: Secondary | ICD-10-CM | POA: Diagnosis not present

## 2021-09-05 DIAGNOSIS — N183 Chronic kidney disease, stage 3 unspecified: Secondary | ICD-10-CM | POA: Diagnosis not present

## 2021-09-07 ENCOUNTER — Other Ambulatory Visit: Payer: Self-pay | Admitting: Pulmonary Disease

## 2021-09-07 ENCOUNTER — Ambulatory Visit (INDEPENDENT_AMBULATORY_CARE_PROVIDER_SITE_OTHER): Payer: Medicare Other | Admitting: Medical

## 2021-09-07 ENCOUNTER — Other Ambulatory Visit
Admission: RE | Admit: 2021-09-07 | Discharge: 2021-09-07 | Disposition: A | Discharge status: Home / Self Care | Payer: Medicare Other | Attending: Medical | Admitting: Medical

## 2021-09-07 ENCOUNTER — Encounter: Payer: Self-pay | Admitting: Medical

## 2021-09-07 VITALS — BP 120/54 | HR 63 | Ht 59.0 in | Wt 187.0 lb

## 2021-09-07 DIAGNOSIS — I25110 Atherosclerotic heart disease of native coronary artery with unstable angina pectoris: Secondary | ICD-10-CM | POA: Diagnosis not present

## 2021-09-07 DIAGNOSIS — R0602 Shortness of breath: Secondary | ICD-10-CM

## 2021-09-07 DIAGNOSIS — I480 Paroxysmal atrial fibrillation: Secondary | ICD-10-CM | POA: Diagnosis not present

## 2021-09-07 DIAGNOSIS — K922 Gastrointestinal hemorrhage, unspecified: Secondary | ICD-10-CM

## 2021-09-07 DIAGNOSIS — D649 Anemia, unspecified: Secondary | ICD-10-CM

## 2021-09-07 DIAGNOSIS — I5032 Chronic diastolic (congestive) heart failure: Secondary | ICD-10-CM | POA: Diagnosis not present

## 2021-09-07 DIAGNOSIS — N183 Chronic kidney disease, stage 3 unspecified: Secondary | ICD-10-CM | POA: Diagnosis not present

## 2021-09-07 DIAGNOSIS — J849 Interstitial pulmonary disease, unspecified: Secondary | ICD-10-CM

## 2021-09-07 LAB — BASIC METABOLIC PANEL
Anion gap: 9 (ref 5–15)
BUN: 22 mg/dL (ref 8–23)
CO2: 28 mmol/L (ref 22–32)
Calcium: 8.9 mg/dL (ref 8.9–10.3)
Chloride: 103 mmol/L (ref 98–111)
Creatinine, Ser: 2.15 mg/dL — ABNORMAL HIGH (ref 0.44–1.00)
GFR, Estimated: 22 mL/min — ABNORMAL LOW (ref >60)
Glucose, Bld: 125 mg/dL — ABNORMAL HIGH (ref 70–99)
Potassium: 2.8 mmol/L — ABNORMAL LOW (ref 3.5–5.1)
Sodium: 140 mmol/L (ref 135–145)

## 2021-09-07 LAB — BRAIN NATRIURETIC PEPTIDE: B Natriuretic Peptide: 472.1 pg/mL — ABNORMAL HIGH (ref 0.0–100.0)

## 2021-09-07 NOTE — Patient Instructions (Signed)
Medication Instructions:  ? ?Your physician has recommended you make the following change in your medication:  ? ?CONTINUE Torsemide 40 mg TWICE daily for ONE WEEK, THEN resume Torsemide 40 mg daily pending lab results. We will call with lab results to give further instructions regarding Torsemide.  ? ?*If you need a refill on your cardiac medications before your next appointment, please call your pharmacy* ? ? ?Lab Work: ? ?Today at the medical mall at [facility]: BNP, BMET ? ?-  Please go to the [facility].  ?-  You will check in at the front desk to the right as you walk into the atrium.  ? ?If you have labs (blood work) drawn today and your tests are completely normal, you will receive your results only by: ?MyChart Message (if you have MyChart) OR ?A paper copy in the mail ?If you have any lab test that is abnormal or we need to change your treatment, we will call you to review the results. ? ? ?Testing/Procedures: ? ?None ordered ? ? ?Follow-Up: ?At [facility], you and your health needs are our priority.  As part of our continuing mission to provide you with exceptional heart care, we have created designated Provider Care Teams.  These Care Teams include your primary Cardiologist (physician) and Advanced Practice Providers (APPs -  Physician Assistants and Nurse Practitioners) who all work together to provide you with the care you need, when you need it. ? ?We recommend signing up for the patient portal called "MyChart".  Sign up information is provided on this After Visit Summary.  MyChart is used to connect with patients for Virtual Visits (Telemedicine).  Patients are able to view lab/test results, encounter notes, upcoming appointments, etc.  Non-urgent messages can be sent to your provider as well.   ?To learn more about what you can do with MyChart, go to [email or web address].   ? ?Your next appointment:   ? ?Follow up as scheduled ? ?Important Information About Sugar ? ? ? ? ? ? ?

## 2021-09-07 NOTE — Progress Notes (Signed)
?Cardiology Office Note:   ? ?Date:  09/07/2021  ? ?ID:  DENAJAH FARIAS, DOB 12-24-1940, MRN [id number] ? ?PCP:  Einar Pheasant, MD  ?[place] Cardiologist:  Ida Rogue, MD  ?[facility] Electrophysiologist:  None  ? ?Referring MD: Einar Pheasant, MD  ? ?Chief Complaint: 1 week follow-up ? ?History of Present Illness:   ? ?April [name] is a 81 y.o. female with a hx of CAD s/p prior RCA and pDA stenting, HFpEF, PAF on Eliquis, anemia with recurrent GI bleeding, CKD stage III, HTN, HLD, and sleep apnea who is being seen today for the evaluation of volume overload. ?  ?Ms. Bartolotta was admitted to the hospital in 01/2018 in [place] with chest discomfort and mild troponin elevation.  Echo demonstrated normal LV systolic function with grade 2 diastolic dysfunction.  LHC showed patent RCA stents and otherwise no targets for intervention.  Over the years, she has had issues with chronic lower extremity swelling and lymphedema for which she has been using wraps and lymphedema pumps. ?  ?She was admitted to [facility] in 06/2021 with symptomatic anemia with melena. HGB dropped to 7.2.  She required 2 units pRBC and Eliquis was held. EGD was performed and showed mildly erythematous gastric mucosa without obvious bleeding.  This was followed by colonoscopy which showed no source of bleeding, polyps (pathology negative for malignancy), nonbleeding internal hemorrhoids, and diverticulosis.Capsule endoscopy was subsequently carried out.  EGD biopsies were negative for H. pylori. She was discharged home off of Eliquis. She was seen by GI on 07/21/2021, at which time H&H were stable. Note indicates that she was cleared to resume anticoagulation.  ?  ?She was seen in our office on 07/26/2021, and was noted to be volume up with associated lower extremity swelling and dyspnea.  She reported a 10 pound weight gain.  It was noted she remained net positive throughout her prior admission in the context of IV fluids, PRBC, and  holding of diuretic therapy because of AKI.  She had been alternating Lasix 40 mg daily with 40 mg twice daily without significant improvement in symptoms.  In an effort to attempt to prevent readmission, her Lasix was titrated for 3 days to 80 mg twice daily followed by 40 mg twice daily thereafter.  She was restarted on Eliquis 5 mg twice daily.  She was treated for lower extremity cellulitis as well. ?  ?She was admitted to [facility] in early April with recurrent melena as well as a small amount of blood-streaked reflux leading her Eliquis to be held at that time. She was evaluated by GI and underwent enteroscopy and capsule endoscopy which showed a large hiatal hernia, patchy erythematous and petechial mucosa with erosion in the stomach, the duodenum, and jejunum appeared normal.  H. pylori stool antigen was negative. GI subsequently cleared the patient to resume Eliquis, though this was held at discharge, at patient request. She contacted our office on 4/6 concerned about this, after discussion with her primary cardiologist, it was recommended she resume Eliquis at 2.5 mg bid (now met reduced dosing criteria), and hold ASA.  ?  ?She was admitted to [facility] on 08/08/2021-4/17 with acute on chronic HFpEF, recurrent melena, and anemia.  She reported an approximate 15 to 20 pound weight gain.  High sensitivity troponin 15 with a delta troponin of 16. BNP 1032. HGB 8.2, which is similar to her discharge value at [facility] of 8.8. BUN/SCr 29/1.87-29/1.79.  EKG showed sinus bradycardia with known left bundle  branch block.  Chest x-ray showed mild pulmonary interstitial edema with new small bilateral pleural effusions as well as a stable large hiatal hernia.  In the ED, she received IV Lasix 40 mg and was placed on IV Lasix 60 mg daily upon admission. Net -11.5L during admission. She was transfused 2 units PRBCs . Remained in NSR with amiodarone. She was d/c'd on torsemide 40 in the AM and and 20 in the pm. ? ?She was  seen in the office 08/29/21 and was feeling weak with persistent SOB and LLE. Also with lymphedema. She was in NSR on propranolol and amiodarone. PRI was 296 and propranolol was stopped. Torsemide was increased to 62m BID. She was referred to EP for watchman consideration.  ? ?Today, the patient is still volume up. Some of this is chronic, however difficult to know how much. Prior BNP was up to 946. Breathing is better. No chest pain. She is unable to use UNNA boots, but compression wraps are available. She elevates her feet.   ? ?Past Medical History:  ?Diagnosis Date  ? Anemia   ? CAD S/P PCI pRCA Promus DES 3.5 x 16 (4.1 mm), ostRPDA Promus DES 2.5 x 12 (2.7 mm) 03/25/2016  ? CAD S/P percutaneous coronary angioplasty   ? a. 07/2015 MV: No ischemia, EF 66%;  b. 03/2016 Inflat STEMI/PCI: LM nl, LAD 25d, RI nl, LCX nl, OM1/2 nl, RCA 80p (3.5x16 Promus Premier DES), 50p/m, 30d, RPDA 90 (2.5x12 Promu Premier DES); c. [place] Patent RCA stents->Med Rx.  ? CHF (congestive heart failure) ([place]   ? CKD (chronic kidney disease), stage III ([place]   ? Diabetes mellitus without complication ([place]   ? Diastolic dysfunction   ? a. 10/2013 Echo: EF 55-65%, Gr1 DD; b. [place]/2018 [place]: EF 55-60%, Gr2 DD, RVSP 464mg.  ? Diverticulitis   ? Dyspnea   ? Endometriosis   ? Family history of adverse reaction to anesthesia   ? Mother - had to stay in ICU because of breathing difficulties every time she had anesthesia  ? GERD (gastroesophageal reflux disease)   ? GI bleed   ? a. 06/2021 - melena-->2 u PRBCs.  ? Hiatal hernia   ? History of colon polyps 08/1994  ? History of Migraines   ? Hypercholesterolemia   ? Hypertension   ? LBBB (left bundle branch block)   ? Osteoporosis   ? PAF (paroxysmal atrial fibrillation) ([place]  ? a. 03/2016 Dx @ time of MI; b. CHA2DS2VASc = 5-->Eliquis.  ? Rheumatic fever   ? Sleep apnea   ? CPAP  ? Spasmodic dysphonia   ? ST elevation myocardial  infarction (STEMI) of inferolateral wall, [facility]  ? ST elevation myocardial infarction (STEMI) of inferolateral wall, initial episode of care ([facility]2017  ? Urine incontinence   ? ? ?Past Surgical History:  ?Procedure Laterality Date  ? ABDOMINAL HYSTERECTOMY    ? ovaries not removed  ? APPENDECTOMY    ? was removed during hysterectomy  ? Breast biopsies    ? x2  ? BREAST EXCISIONAL BIOPSY Bilateral "years ago"  ? neg  ? BREAST SURGERY Bilateral   ? BROW LIFT Bilateral 08/15/2019  ? Procedure: BLEPHAROPLASTY UPPER EYELID; W/EXCESS SKIN BLEPHAROPTOSIS REPAIR; RESECT EX;  Surgeon: FoKarle StarchMD;  [place];  Laterality: Bilateral;  sleep apnea  ? CARDIAC CATHETERIZATION  2011  ?  moderate 40% RCA disease  ? CARDIAC CATHETERIZATION  01/2010  ? Dr. Gollan@[facility] : Only noted 40% RCA  ? CARDIAC CATHETERIZATION N/A 03/25/2016  ? Procedure: Left Heart Cath and Coronary Angiography;  Surgeon: 04/07/2016, MD;  Location: [place] CV LAB;  Service: Cardiovascular;  Laterality: N/A;  ? CARDIAC CATHETERIZATION N/A 03/25/2016  ? Procedure: Coronary Stent Intervention;  Surgeon: 04/07/2016, MD;  Location: [place] CV LAB;  Service: Cardiovascular;  Laterality: N/A;  ? COLONOSCOPY  2013  ? COLONOSCOPY WITH PROPOFOL N/A 07/11/2021  ? Procedure: COLONOSCOPY WITH PROPOFOL;  Surgeon: 07/24/2021, MD;  Location: [facility] ENDOSCOPY;  Service: Endoscopy;  Laterality: N/A;  ? ESOPHAGOGASTRODUODENOSCOPY (EGD) WITH PROPOFOL N/A 03/17/2015  ? Procedure: ESOPHAGOGASTRODUODENOSCOPY (EGD) WITH PROPOFOL;  Surgeon: 03/30/2015, MD;  Location: [facility] ENDOSCOPY;  Service: Gastroenterology;  Laterality: N/A;  ? ESOPHAGOGASTRODUODENOSCOPY (EGD) WITH PROPOFOL N/A 07/10/2021  ? Procedure: ESOPHAGOGASTRODUODENOSCOPY (EGD) WITH PROPOFOL;  Surgeon: 16/03/2022, MD;  Location: [facility] ENDOSCOPY;  Service: Gastroenterology;  Laterality: N/A;  ? GIVENS  CAPSULE STUDY N/A 07/11/2021  ? Procedure: GIVENS CAPSULE STUDY;  Surgeon: 07/24/2021, MD;  Location: [facility] ENDOSCOPY;  Service: Endoscopy;  Laterality: N/A;  ? NM MYOVIEW ([place] HX)  07/2015  ? No evidence i

## 2021-09-08 ENCOUNTER — Other Ambulatory Visit: Payer: Self-pay | Admitting: Cardiovascular Disease

## 2021-09-08 ENCOUNTER — Telehealth: Payer: Self-pay

## 2021-09-08 DIAGNOSIS — M6281 Muscle weakness (generalized): Secondary | ICD-10-CM | POA: Diagnosis not present

## 2021-09-08 DIAGNOSIS — R2689 Other abnormalities of gait and mobility: Secondary | ICD-10-CM | POA: Diagnosis not present

## 2021-09-08 DIAGNOSIS — I5032 Chronic diastolic (congestive) heart failure: Secondary | ICD-10-CM | POA: Diagnosis not present

## 2021-09-08 DIAGNOSIS — E876 Hypokalemia: Secondary | ICD-10-CM

## 2021-09-08 DIAGNOSIS — Z9181 History of falling: Secondary | ICD-10-CM | POA: Diagnosis not present

## 2021-09-08 DIAGNOSIS — R279 Unspecified lack of coordination: Secondary | ICD-10-CM | POA: Diagnosis not present

## 2021-09-08 DIAGNOSIS — R262 Difficulty in walking, not elsewhere classified: Secondary | ICD-10-CM | POA: Diagnosis not present

## 2021-09-08 MED ORDER — POTASSIUM CHLORIDE ER 10 MEQ PO TBCR: 20.0000 meq | EXTENDED_RELEASE_TABLET | Freq: Every day | ORAL | Status: DC

## 2021-09-08 MED ORDER — TORSEMIDE 20 MG PO TABS: 40.0000 mg | ORAL_TABLET | Freq: Every day | ORAL | Status: DC

## 2021-09-08 NOTE — Telephone Encounter (Signed)
Patient made aware of lab results and Cadence Furth, [place] recommendation. ?Patient will have her repeat bmp on 09/21/21 when she comes in for her appt with Dr. Quentin Ore. ?Lab orders placed for the medical mall. ?Patient verbalized understanding to the instruction given and voiced appreciation for the call. ?

## 2021-09-08 NOTE — Telephone Encounter (Signed)
-----   Message from [place], PA-C sent at 09/07/2021  4:25 PM EDT ----- ?Labs showed low potassium so we will give klor-con 62mq to take once and then 260m to take daily ?Kidney function is worse so we will go back down to torsemide '40mg'$  daily. Suspect most of the swelling is lymphedema ?Re-check BMET in 2 weeks.  ?

## 2021-09-15 ENCOUNTER — Ambulatory Visit
Admission: RE | Admit: 2021-09-15 | Discharge: 2021-09-15 | Disposition: A | Discharge status: Home / Self Care | Payer: Medicare Other | Source: Ambulatory Visit | Attending: Family | Admitting: Family

## 2021-09-15 ENCOUNTER — Other Ambulatory Visit: Payer: Self-pay | Admitting: Family

## 2021-09-15 DIAGNOSIS — I5033 Acute on chronic diastolic (congestive) heart failure: Secondary | ICD-10-CM | POA: Insufficient documentation

## 2021-09-15 LAB — BASIC METABOLIC PANEL
Anion gap: 9 (ref 5–15)
BUN: 22 mg/dL (ref 8–23)
CO2: 23 mmol/L (ref 22–32)
Calcium: 8.7 mg/dL — ABNORMAL LOW (ref 8.9–10.3)
Chloride: 105 mmol/L (ref 98–111)
Creatinine, Ser: 2.29 mg/dL — ABNORMAL HIGH (ref 0.44–1.00)
GFR, Estimated: 21 mL/min — ABNORMAL LOW (ref >60)
Glucose, Bld: 146 mg/dL — ABNORMAL HIGH (ref 70–99)
Potassium: 3.8 mmol/L (ref 3.5–5.1)
Sodium: 137 mmol/L (ref 135–145)

## 2021-09-15 LAB — BRAIN NATRIURETIC PEPTIDE: B Natriuretic Peptide: 363.9 pg/mL — ABNORMAL HIGH (ref 0.0–100.0)

## 2021-09-15 MED ORDER — FUROSEMIDE 10 MG/ML IJ SOLN
INTRAMUSCULAR | Status: AC
  Administered 2021-09-15: 80 mg via INTRAVENOUS
  Filled 2021-09-15: qty 8

## 2021-09-15 MED ORDER — FUROSEMIDE 10 MG/ML IJ SOLN: 80.0000 mg | Freq: Once | INTRAMUSCULAR | Status: AC

## 2021-09-15 MED ORDER — POTASSIUM CHLORIDE CRYS ER 20 MEQ PO TBCR
EXTENDED_RELEASE_TABLET | ORAL | Status: AC
  Administered 2021-09-15: 40 meq via ORAL
  Filled 2021-09-15: qty 2

## 2021-09-15 MED ORDER — POTASSIUM CHLORIDE CRYS ER 20 MEQ PO TBCR: 40.0000 meq | EXTENDED_RELEASE_TABLET | Freq: Once | ORAL | Status: AC

## 2021-09-15 NOTE — Progress Notes (Signed)
Patient ID: April [name], female    DOB: 24-Sep-1940, 81 y.o.   MRN: [id number]  HPI  April [name] is a 81 y/o female with a history of CAD, DM, hyperlipidemia, HTN, CKD, anemia, diverticulitis, endometriosis, GERD, GIB, LBBB, PAF, sleep apnea, spasmodic dysphonia and chronic heart failure.   Echo report from 08/10/21 reviewed and showed an EF of 60-65% along with mildly elevated PA pressure, mild LAE, mild MR and moderate TR.   LHC done 03/25/16 and showed: Prox RCA lesion, 80 %stenosed. A STENT PROMUS PREM MR 3.5X16 (3.8-4.1 mm) drug eluting stent was successfully placed. Post intervention, there is a 0% residual stenosis. Ost RPDA lesion, 90 %stenosed. A STENT PROMUS PREM MR 2.5X12 (2.7 mm) drug eluting stent was successfully placed. Post intervention, there is a 0% residual stenosis. __________________________________ Prox RCA to Mid RCA lesion, 50 %stenosed. Dist RCA lesion, 30 %stenosed.- Appears smooth and chronic Dist LAD lesion, 25 %stenosed - diffuse disease in the distal LAD. Nonsignificant The left ventricular systolic function is normal. The left ventricular ejection fraction is 55-65% by visual estimate. There is no mitral valve regurgitation. There is no aortic valve stenosis.  Admitted 08/08/21 due to worsening shortness of breath, weight gain and abdominal swelling. Initially given IV lasix with transition to oral diuretics. Cardiology and palliative care consults obtained. Given 1 unit PRBC's due to anemia. Hypokalemia corrected. OT/PT evaluations done. Discharged after 8 days.   She presents today for an urgent visit with a chief complaint of moderate fatigue with minimal exertion. Describes this as chronic in nature. She has associated cough, shortness of breath, wheezing & pedal edema along with this. She denies any dizziness, difficulty sleeping, abdominal distention, palpitations, chest pain or weight gain.   Received '80mg'$  IV lasix/ 82mq PO potassium yesterday due to  weight gain and worsening SOB. Doesn't feel like her symptoms are much different since getting the IV lasix.   Did go to the beach last week so probably got more sodium than she's accustomed to.   [facility] and eats her meals in the cafeteria. Does not add any salt to her food.   Past Medical History:  Diagnosis Date   Anemia    CAD S/P PCI pRCA Promus DES 3.5 x 16 (4.1 mm), ostRPDA Promus DES 2.5 x 12 (2.7 mm) 03/25/2016   CAD S/P percutaneous coronary angioplasty    a. 07/2015 MV: No ischemia, EF 66%;  b. 03/2016 Inflat STEMI/PCI: LM nl, LAD 25d, RI nl, LCX nl, OM1/2 nl, RCA 80p (3.5x16 Promus Premier DES), 50p/m, 30d, RPDA 90 (2.5x12 Promu Premier DES); c. [place] RCA stents->Med Rx.   CHF (congestive heart failure) [place]    CKD (chronic kidney disease), stage III ([place]    Diabetes mellitus without complication ([place]    Diastolic dysfunction    a. 10/2013 Echo: EF 55-65%, Gr1 DD; b. [place] EF 55-60%, Gr2 DD, RVSP 416mg.   Diverticulitis    Dyspnea    Endometriosis    Family history of adverse reaction to anesthesia    Mother - had to stay in ICU because of breathing difficulties every time she had anesthesia   GERD (gastroesophageal reflux disease)    GI bleed    a. 06/2021 - melena-->2 u PRBCs.   Hiatal hernia    History of colon polyps 08/1994   History of Migraines    Hypercholesterolemia    Hypertension  LBBB (left bundle branch block)    Osteoporosis    PAF (paroxysmal atrial fibrillation) ([place])    a. 03/2016 Dx @ time of MI; b. CHA2DS2VASc = 5-->Eliquis.   Rheumatic fever    Sleep apnea    CPAP   Spasmodic dysphonia    ST elevation myocardial infarction (STEMI) of inferolateral wall, initial episode of care ([place]) 03/25/2016   ST elevation myocardial infarction (STEMI) of inferolateral wall, initial episode of care [facility]) 03/25/2016   Urine incontinence    Past  Surgical History:  Procedure Laterality Date   ABDOMINAL HYSTERECTOMY     ovaries not removed   APPENDECTOMY     was removed during hysterectomy   Breast biopsies     x2   BREAST EXCISIONAL BIOPSY Bilateral "years ago"   neg   BREAST SURGERY Bilateral    BROW LIFT Bilateral 08/15/2019   Procedure: BLEPHAROPLASTY UPPER EYELID; W/EXCESS SKIN BLEPHAROPTOSIS REPAIR; RESECT EX;  Surgeon: Karle Starch, MD;  Location: [place];  Service: Ophthalmology;  Laterality: Bilateral;  sleep apnea   CARDIAC CATHETERIZATION  2011   moderate 40% RCA disease   CARDIAC CATHETERIZATION  01/2010   Dr. Gollan'@[facility]'$ : Only noted 40% RCA   CARDIAC CATHETERIZATION N/A 03/25/2016   Procedure: Left Heart Cath and Coronary Angiography;  Surgeon: 04/07/2016, MD;  Location: [place] CV LAB;  Service: Cardiovascular;  Laterality: N/A;   CARDIAC CATHETERIZATION N/A 03/25/2016   Procedure: Coronary Stent Intervention;  Surgeon: 04/07/2016, MD;  Location: [place] CV LAB;  Service: Cardiovascular;  Laterality: N/A;   COLONOSCOPY  2013   COLONOSCOPY WITH PROPOFOL N/A 07/11/2021   Procedure: COLONOSCOPY WITH PROPOFOL;  Surgeon: 07/24/2021, MD;  Location: [facility] ENDOSCOPY;  Service: Endoscopy;  Laterality: N/A;   ESOPHAGOGASTRODUODENOSCOPY (EGD) WITH PROPOFOL N/A 03/17/2015   Procedure: ESOPHAGOGASTRODUODENOSCOPY (EGD) WITH PROPOFOL;  Surgeon: 03/30/2015, MD;  Location: [facility] ENDOSCOPY;  Service: Gastroenterology;  Laterality: N/A;   ESOPHAGOGASTRODUODENOSCOPY (EGD) WITH PROPOFOL N/A 07/10/2021   Procedure: ESOPHAGOGASTRODUODENOSCOPY (EGD) WITH PROPOFOL;  Surgeon: 16/03/2022, MD;  Location: [facility] ENDOSCOPY;  Service: Gastroenterology;  Laterality: N/A;   GIVENS CAPSULE STUDY N/A 07/11/2021   Procedure: GIVENS CAPSULE STUDY;  Surgeon: 07/24/2021, MD;  Location: [facility] ENDOSCOPY;  Service: Endoscopy;  Laterality: N/A;   NM MYOVIEW ([place] H[facility])  07/2015   No evidence ischemia or  infarction. EF 66%. Low risk   TONSILLECTOMY     TRANSTHORACIC ECHOCARDIOGRAM  10/2013   Normal LV size and function. EF 55-65%. GR 1 DD. Otherwise normal.   Family History  Problem Relation Age of Onset   Arthritis Mother    Stroke Mother    Hypertension Mother    Osteoporosis Mother    Heart disease Father        MI   Heart attack Father    Stroke Brother    Heart attack Sister    Breast cancer Other        first cousin x 2   Stroke Daughter    Heart attack Daughter    Colon cancer Neg Hx    Social History   Tobacco Use   Smoking status: Never   Smokeless tobacco: Never  Substance Use Topics   Alcohol use: No    Alcohol/week: 0.0 standard drinks   Allergies  Allergen Reactions   Penicillins Other (See Comments)    Okay to take amoxicillin/(pt does not recall what the reaction to penicillin was (73-14 years old)  Sulfa Antibiotics Rash   Prior to Admission medications   Medication Sig Start Date End Date Taking? Authorizing Provider  acetaminophen (TYLENOL) 500 MG tablet Take 500 mg by mouth daily as needed for headache (pain).   Yes [provider]  albuterol (PROVENTIL HFA;VENTOLIN HFA) 108 (90 Base) MCG/ACT inhaler Inhale 2 puffs into the lungs every 6 (six) hours as needed for wheezing or shortness of breath. 04/22/18  Yes Kasa, Maretta Bees, MD  amiodarone (PACERONE) 200 MG tablet TAKE 1 TABLET BY MOUTH DAILY 06/30/21  Yes Gollan, Kathlene November, MD  Azelastine HCl 137 MCG/SPRAY SOLN USE 1 TO 2 SPRAYS IN EACH NOSTRIL TWICE A DAY AS NEEDED FOR DRAINAGE 01/20/20  Yes Einar Pheasant, MD  cholecalciferol (VITAMIN D3) 25 MCG (1000 UNIT) tablet Take 1,000 Units by mouth daily.   Yes [provider]  famotidine (PEPCID) 20 MG tablet Take 20 mg by mouth. Takes 1 to 2 times daily   Yes [provider]  fluticasone-salmeterol (ADVAIR HFA) 230-21 MCG/ACT inhaler Inhale 2 puffs into the lungs 2 (two) times daily. 10/20/20  Yes Flora Lipps, MD   Glucosamine-Chondroit-Collagen [phone] MG CAPS Take 1 tablet by mouth in the morning and at bedtime.   Yes [provider]  hydrALAZINE (APRESOLINE) 10 MG tablet TAKE ONE TABLET BY MOUTH THREE TIMES A DAY 04/15/21  Yes Gollan, Kathlene November, MD  metolazone (ZAROXOLYN) 2.5 MG tablet Take 1 tablet (2.5 mg total) by mouth daily. On Sat & Mon. Take it 1/2 hour before torsemide 09/16/21   Alisa Graff, FNP  mupirocin ointment (BACTROBAN) 2 % Apply 1 application. topically 2 (two) times daily.   Yes [provider]  nitroGLYCERIN (NITROSTAT) 0.4 MG SL tablet Place 1 tablet (0.4 mg total) under the tongue every 5 (five) minutes as needed. 04/22/18  Yes Gollan, Kathlene November, MD  Polyethyl Glycol-Propyl Glycol (SYSTANE OP) Place 1 drop into both eyes daily as needed (dry eyes).   Yes [provider]  potassium chloride (KLOR-CON) 10 MEQ tablet Take 2 tablets (20 mEq total) by mouth daily. 09/08/21  Yes Furth, Cadence H, PA-C  Propylene Glycol (SYSTANE COMPLETE) 0.6 % SOLN Apply 1 drop to eye daily.   Yes [provider]  rosuvastatin (CRESTOR) 40 MG tablet TAKE 1 TABLET BY MOUTH DAILY 09/08/21  Yes Furth, Cadence H, PA-C  torsemide (DEMADEX) 20 MG tablet Take 2 tablets (40 mg total) by mouth daily. 09/08/21  Yes Furth, Cadence H, PA-C  traMADol (ULTRAM) 50 MG tablet Take by mouth every 6 (six) hours as needed.   Yes [provider]    Review of Systems  Constitutional:  Positive for fatigue (easily). Negative for appetite change.  HENT:  Negative for congestion, postnasal drip and sore throat.   Eyes: Negative.   Respiratory:  Positive for cough (dry), shortness of breath and wheezing. Negative for chest tightness.   Cardiovascular:  Positive for leg swelling. Negative for chest pain and palpitations.  Gastrointestinal:  Negative for abdominal distention and abdominal pain.  Endocrine: Negative.   Genitourinary: Negative.   Musculoskeletal:  Negative for  back pain and neck pain.  Skin: Negative.   Allergic/Immunologic: Negative.   Neurological:  Negative for dizziness and light-headedness.  Hematological:  Negative for adenopathy. Does not bruise/bleed easily.  Psychiatric/Behavioral:  Negative for dysphoric mood and sleep disturbance (sleeping with HOB elevated;wearing CPAP nightly). The patient is not nervous/anxious.    Vitals:   09/16/21 1158  BP: (!) 152/48  Pulse: 80  Resp:  16  SpO2: 100%  Weight: 192 lb 6 oz (87.3 kg)  Height: '4\' 11"'$  (1.499 m)   Wt Readings from Last 3 Encounters:  09/16/21 192 lb 6 oz (87.3 kg)  09/07/21 187 lb (84.8 kg)  08/29/21 188 lb (85.3 kg)   Lab Results  Component Value Date   CREATININE 2.29 (H) 09/15/2021   CREATININE 2.15 (H) 09/07/2021   CREATININE 1.75 (H) 08/24/2021    Physical Exam Vitals and nursing note reviewed. Exam conducted with a chaperone present (son).  Constitutional:      Appearance: She is well-developed.  HENT:     Head: Normocephalic and atraumatic.  Neck:     Vascular: No JVD.  Cardiovascular:     Rate and Rhythm: Normal rate and regular rhythm.  Pulmonary:     Effort: Pulmonary effort is normal.     Breath sounds: Normal breath sounds. No wheezing, rhonchi or rales.  Abdominal:     Palpations: Abdomen is soft.     Tenderness: There is no abdominal tenderness.  Musculoskeletal:     Cervical back: Normal range of motion and neck supple.     Right lower leg: No tenderness. Edema (2+ pitting) present.     Left lower leg: No tenderness. Edema (2+ pitting) present.  Skin:    General: Skin is warm and dry.  Neurological:     General: No focal deficit present.     Mental Status: She is alert and oriented to person, place, and time.  Psychiatric:        Mood and Affect: Mood normal.        Behavior: Behavior normal.    Assessment & Plan:  1: Chronic heart failure with preserved ejection fraction with structural changes (LAE)- - NYHA class III - mildly fluid  overloaded today with symptoms, weight gain and minimal response to IV lasix - weighing daily; reminded to call for an overnight weight gain of > 2 pounds or a weekly weight gain of > 5 pounds - weight up 5 pounds from last visit here 1 month ago - not adding salt and trying to eat low sodium foods although was at the beach last week so probably ate more salt than usual - trying to keep daily fluid intake to < 60 ounces/ day - received '80mg'$  IV lasix/ 28mq PO potassium yesterday but doesn't feel like it helped - check BMP today; consider metolazone depending on renal function - has been wearing zip up compression socks but unable to zip them all the way due to edema - has been to vascular provider in the past for unna boot wrapping but says that those hurt her legs - has used ACE wraps in the past - has compression boots that she can use but currently they hurt her legs  - reports that edema improves some overnight - BNP 09/15/21 was 363.9  2: HTN- - BP mildly elevated (152/48) - saw PCP (Nicki Reaper 08/24/21; returns 09/21/22 - BMP 09/15/21 reviewed and showed sodium 137, potassium 3.8, creatinine 2.29 and GFR 21 - sees nephrology 10/06/21  3: PAF- - saw cardiology (Kathlen Mody 09/07/21; returns 10/03/21  4: Anemia- - saw GI (Allen [name] 07/21/21 - Hg 08/16/21 was 9.2  5: DM- - A1c 01/20/21 was 6.2%  6: Sleep apnea- - wearing CPAP nightly - saw pulmonology (Lanney Gins 09/05/21    Patient did not bring her medications nor a list. Each medication was verbally reviewed with the patient and she was encouraged to bring the bottles to every  visit to confirm accuracy of list.   Return in 6 weeks, sooner if needed. Will call patient after lab results are received.

## 2021-09-16 ENCOUNTER — Encounter: Payer: Self-pay | Admitting: Family

## 2021-09-16 ENCOUNTER — Other Ambulatory Visit
Admission: RE | Admit: 2021-09-16 | Discharge: 2021-09-16 | Disposition: A | Discharge status: Home / Self Care | Payer: Medicare Other | Source: Ambulatory Visit | Attending: Family | Admitting: Family

## 2021-09-16 ENCOUNTER — Telehealth: Payer: Self-pay | Admitting: Family

## 2021-09-16 ENCOUNTER — Ambulatory Visit (HOSPITAL_BASED_OUTPATIENT_CLINIC_OR_DEPARTMENT_OTHER): Payer: Medicare Other | Admitting: Family

## 2021-09-16 VITALS — BP 152/48 | HR 80 | Resp 16 | Ht 59.0 in | Wt 192.4 lb

## 2021-09-16 DIAGNOSIS — I2510 Atherosclerotic heart disease of native coronary artery without angina pectoris: Secondary | ICD-10-CM

## 2021-09-16 DIAGNOSIS — I5033 Acute on chronic diastolic (congestive) heart failure: Secondary | ICD-10-CM | POA: Insufficient documentation

## 2021-09-16 DIAGNOSIS — I480 Paroxysmal atrial fibrillation: Secondary | ICD-10-CM

## 2021-09-16 DIAGNOSIS — R0602 Shortness of breath: Secondary | ICD-10-CM | POA: Insufficient documentation

## 2021-09-16 DIAGNOSIS — I10 Essential (primary) hypertension: Secondary | ICD-10-CM

## 2021-09-16 DIAGNOSIS — I447 Left bundle-branch block, unspecified: Secondary | ICD-10-CM | POA: Diagnosis not present

## 2021-09-16 DIAGNOSIS — Z9861 Coronary angioplasty status: Secondary | ICD-10-CM | POA: Diagnosis not present

## 2021-09-16 DIAGNOSIS — K219 Gastro-esophageal reflux disease without esophagitis: Secondary | ICD-10-CM | POA: Insufficient documentation

## 2021-09-16 DIAGNOSIS — E1122 Type 2 diabetes mellitus with diabetic chronic kidney disease: Secondary | ICD-10-CM | POA: Insufficient documentation

## 2021-09-16 DIAGNOSIS — G4730 Sleep apnea, unspecified: Secondary | ICD-10-CM | POA: Diagnosis not present

## 2021-09-16 DIAGNOSIS — D649 Anemia, unspecified: Secondary | ICD-10-CM | POA: Insufficient documentation

## 2021-09-16 DIAGNOSIS — I130 Hypertensive heart and chronic kidney disease with heart failure and stage 1 through stage 4 chronic kidney disease, or unspecified chronic kidney disease: Secondary | ICD-10-CM | POA: Insufficient documentation

## 2021-09-16 DIAGNOSIS — D631 Anemia in chronic kidney disease: Secondary | ICD-10-CM | POA: Diagnosis not present

## 2021-09-16 DIAGNOSIS — G4733 Obstructive sleep apnea (adult) (pediatric): Secondary | ICD-10-CM | POA: Diagnosis not present

## 2021-09-16 DIAGNOSIS — N183 Chronic kidney disease, stage 3 unspecified: Secondary | ICD-10-CM | POA: Insufficient documentation

## 2021-09-16 DIAGNOSIS — Z955 Presence of coronary angioplasty implant and graft: Secondary | ICD-10-CM | POA: Insufficient documentation

## 2021-09-16 DIAGNOSIS — N184 Chronic kidney disease, stage 4 (severe): Secondary | ICD-10-CM

## 2021-09-16 DIAGNOSIS — I5032 Chronic diastolic (congestive) heart failure: Secondary | ICD-10-CM | POA: Insufficient documentation

## 2021-09-16 LAB — BASIC METABOLIC PANEL
Anion gap: 10 (ref 5–15)
BUN: 22 mg/dL (ref 8–23)
CO2: 23 mmol/L (ref 22–32)
Calcium: 8.4 mg/dL — ABNORMAL LOW (ref 8.9–10.3)
Chloride: 105 mmol/L (ref 98–111)
Creatinine, Ser: 2.01 mg/dL — ABNORMAL HIGH (ref 0.44–1.00)
GFR, Estimated: 24 mL/min — ABNORMAL LOW (ref >60)
Glucose, Bld: 107 mg/dL — ABNORMAL HIGH (ref 70–99)
Potassium: 3.8 mmol/L (ref 3.5–5.1)
Sodium: 138 mmol/L (ref 135–145)

## 2021-09-16 MED ORDER — METOLAZONE 2.5 MG PO TABS: 2.5000 mg | ORAL_TABLET | Freq: Every day | ORAL | 0 refills | Status: DC

## 2021-09-16 NOTE — Telephone Encounter (Addendum)
Spoke with patient regarding BMP results obtained earlier today after she received '80mg'$  IV lasix/ 16mq potassium yesterday.  Renal function has improved slightly.  Will give 2.'5mg'$  metolazone X 2 doses. She will take a dose tomorrow (Saturday) and another dose on Monday. Advised to take it 1/2 hour before her torsemide. On those 2 days, she is to double her potassium.   She has PCP appointment on Wed so will reach out to her PCP and ask her to check BMET at that time. Patient verbalized understanding.

## 2021-09-16 NOTE — Patient Instructions (Signed)
Continue weighing daily and call for an overnight weight gain of 3 pounds or more or a weekly weight gain of more than 5 pounds.   If you have voicemail, please make sure your mailbox is cleaned out so that we may leave a message and please make sure to listen to any voicemails.     

## 2021-09-19 ENCOUNTER — Ambulatory Visit
Admission: RE | Admit: 2021-09-19 | Discharge: 2021-09-19 | Disposition: A | Discharge status: Home / Self Care | Payer: Medicare Other | Source: Ambulatory Visit | Attending: Pulmonary Disease | Admitting: Pulmonary Disease

## 2021-09-19 DIAGNOSIS — J90 Pleural effusion, not elsewhere classified: Secondary | ICD-10-CM | POA: Diagnosis not present

## 2021-09-19 DIAGNOSIS — R0602 Shortness of breath: Secondary | ICD-10-CM | POA: Diagnosis not present

## 2021-09-19 DIAGNOSIS — J849 Interstitial pulmonary disease, unspecified: Secondary | ICD-10-CM | POA: Insufficient documentation

## 2021-09-20 DIAGNOSIS — I5032 Chronic diastolic (congestive) heart failure: Secondary | ICD-10-CM | POA: Diagnosis not present

## 2021-09-20 DIAGNOSIS — M6281 Muscle weakness (generalized): Secondary | ICD-10-CM | POA: Diagnosis not present

## 2021-09-20 DIAGNOSIS — R262 Difficulty in walking, not elsewhere classified: Secondary | ICD-10-CM | POA: Diagnosis not present

## 2021-09-20 DIAGNOSIS — Z9181 History of falling: Secondary | ICD-10-CM | POA: Diagnosis not present

## 2021-09-20 DIAGNOSIS — R2689 Other abnormalities of gait and mobility: Secondary | ICD-10-CM | POA: Diagnosis not present

## 2021-09-20 DIAGNOSIS — R279 Unspecified lack of coordination: Secondary | ICD-10-CM | POA: Diagnosis not present

## 2021-09-21 ENCOUNTER — Ambulatory Visit: Payer: Medicare Other | Admitting: Cardiology

## 2021-09-21 ENCOUNTER — Ambulatory Visit (INDEPENDENT_AMBULATORY_CARE_PROVIDER_SITE_OTHER): Payer: Medicare Other | Admitting: Internal Medicine

## 2021-09-21 ENCOUNTER — Telehealth: Payer: Self-pay | Admitting: *Deleted

## 2021-09-21 ENCOUNTER — Encounter: Payer: Self-pay | Admitting: Internal Medicine

## 2021-09-21 VITALS — BP 140/56 | HR 73 | Temp 97.9°F | Resp 14 | Ht 59.0 in | Wt 180.2 lb

## 2021-09-21 DIAGNOSIS — R600 Localized edema: Secondary | ICD-10-CM

## 2021-09-21 DIAGNOSIS — E7800 Pure hypercholesterolemia, unspecified: Secondary | ICD-10-CM

## 2021-09-21 DIAGNOSIS — Z860100 Personal history of colon polyps, unspecified: Secondary | ICD-10-CM

## 2021-09-21 DIAGNOSIS — J449 Chronic obstructive pulmonary disease, unspecified: Secondary | ICD-10-CM | POA: Diagnosis not present

## 2021-09-21 DIAGNOSIS — R7989 Other specified abnormal findings of blood chemistry: Secondary | ICD-10-CM

## 2021-09-21 DIAGNOSIS — I5032 Chronic diastolic (congestive) heart failure: Secondary | ICD-10-CM | POA: Diagnosis not present

## 2021-09-21 DIAGNOSIS — I25110 Atherosclerotic heart disease of native coronary artery with unstable angina pectoris: Secondary | ICD-10-CM

## 2021-09-21 DIAGNOSIS — G4733 Obstructive sleep apnea (adult) (pediatric): Secondary | ICD-10-CM

## 2021-09-21 DIAGNOSIS — I700 Atherosclerosis of aorta: Secondary | ICD-10-CM | POA: Diagnosis not present

## 2021-09-21 DIAGNOSIS — N1831 Chronic kidney disease, stage 3a: Secondary | ICD-10-CM

## 2021-09-21 DIAGNOSIS — K219 Gastro-esophageal reflux disease without esophagitis: Secondary | ICD-10-CM

## 2021-09-21 DIAGNOSIS — R739 Hyperglycemia, unspecified: Secondary | ICD-10-CM

## 2021-09-21 DIAGNOSIS — R0602 Shortness of breath: Secondary | ICD-10-CM

## 2021-09-21 DIAGNOSIS — I10 Essential (primary) hypertension: Secondary | ICD-10-CM

## 2021-09-21 DIAGNOSIS — I480 Paroxysmal atrial fibrillation: Secondary | ICD-10-CM | POA: Diagnosis not present

## 2021-09-21 DIAGNOSIS — D6859 Other primary thrombophilia: Secondary | ICD-10-CM

## 2021-09-21 DIAGNOSIS — E1122 Type 2 diabetes mellitus with diabetic chronic kidney disease: Secondary | ICD-10-CM

## 2021-09-21 DIAGNOSIS — N184 Chronic kidney disease, stage 4 (severe): Secondary | ICD-10-CM

## 2021-09-21 DIAGNOSIS — R251 Tremor, unspecified: Secondary | ICD-10-CM

## 2021-09-21 DIAGNOSIS — I2510 Atherosclerotic heart disease of native coronary artery without angina pectoris: Secondary | ICD-10-CM

## 2021-09-21 DIAGNOSIS — E876 Hypokalemia: Secondary | ICD-10-CM

## 2021-09-21 DIAGNOSIS — Z9861 Coronary angioplasty status: Secondary | ICD-10-CM

## 2021-09-21 LAB — BASIC METABOLIC PANEL
BUN: 32 mg/dL — ABNORMAL HIGH (ref 6–23)
CO2: 34 mEq/L — ABNORMAL HIGH (ref 19–32)
Calcium: 8.9 mg/dL (ref 8.4–10.5)
Chloride: 93 mEq/L — ABNORMAL LOW (ref 96–112)
Creatinine, Ser: 2.45 mg/dL — ABNORMAL HIGH (ref 0.40–1.20)
GFR: 17.85 mL/min — ABNORMAL LOW (ref >60.00)
Glucose, Bld: 127 mg/dL — ABNORMAL HIGH (ref 70–99)
Potassium: 2.3 mEq/L — CL (ref 3.5–5.1)
Sodium: 140 mEq/L (ref 135–145)

## 2021-09-21 LAB — HEMOGLOBIN A1C: Hgb A1c MFr Bld: 6.3 % (ref 4.6–6.5)

## 2021-09-21 LAB — HEPATIC FUNCTION PANEL
ALT: 32 U/L (ref 0–35)
AST: 65 U/L — ABNORMAL HIGH (ref 0–37)
Albumin: 3.2 g/dL — ABNORMAL LOW (ref 3.5–5.2)
Alkaline Phosphatase: 116 U/L (ref 39–117)
Bilirubin, Direct: 0.3 mg/dL (ref 0.0–0.3)
Total Bilirubin: 1 mg/dL (ref 0.2–1.2)
Total Protein: 6.4 g/dL (ref 6.0–8.3)

## 2021-09-21 LAB — TSH: TSH: 1.33 u[IU]/mL (ref 0.35–5.50)

## 2021-09-21 LAB — T3, FREE: T3, Free: 2.8 pg/mL (ref 2.3–4.2)

## 2021-09-21 LAB — T4, FREE: Free T4: 1.91 ng/dL — ABNORMAL HIGH (ref 0.60–1.60)

## 2021-09-21 NOTE — Telephone Encounter (Signed)
Order placed for f/u met b 

## 2021-09-21 NOTE — Telephone Encounter (Signed)
Called and spoke to Ms Ed[place]ston.  Notified of lab results. Has only taken one kcl today.  Recent diuresis.  Discussed taking two tablets now and tonight and then increased to two tablets bid.  Will recheck potassium tomorrow to confirm not continuing to decrease.  Hold on making changes in her regular medication.  Follow metabolic panel.

## 2021-09-21 NOTE — Telephone Encounter (Signed)
CRITICAL VALUE STICKER  CRITICAL VALUE: Potassium-2.3 (L)  RECEIVER (on-site recipient of call): Saa  DATE & TIME NOTIFIED: 09/21/21 @ 4:16pm  MESSENGER (representative from lab): Jari Favre, CMA/XT  MD NOTIFIED: Dr. Derrel Nip (doc of the day) in absence of Dr. Nicki Reaper  TIME OF NOTIFICATION: 4:18pm  RESPONSE:

## 2021-09-21 NOTE — Progress Notes (Signed)
Patient ID: April [name], female   DOB: 11-Apr-1941, 81 y.o.   MRN: [id number]   Subjective:    Patient ID: April [name], female    DOB: August 03, 1940, 81 y.o.   MRN: [id number]   Patient here for a scheduled follow up.   Chief Complaint  Patient presents with   Follow-up   Hypertension   Hyperlipidemia   Shortness of Breath   .   HPI Admitted 08/08/21 - worsening sob and weight gain.  Initially given IV lasix with transition to oral diuretics.  Was transfused 1 unit pRBCs.  Evaluated 09/16/21 Darylene Price - for fatigue and sob.  Received 41m IV lasix 5/18 due to worsening sob.  Renal function improved slightly.  Was given 2.563mmetolazone x 2 doses.  Took a dose Saturday and Monday - 1/2 hour before torsemide.  Today - weight is down.  She is feeling some better.  Able to zip her compression hose.  Still sob with exertion, but has improved some.  No chest pain.  No increased cough or congestion.  Eating.  No nausea or vomiting.  Bowels moving.     Past Medical History:  Diagnosis Date   Anemia    CAD S/P PCI pRCA Promus DES 3.5 x 16 (4.1 mm), ostRPDA Promus DES 2.5 x 12 (2.7 mm) 03/25/2016   CAD S/P percutaneous coronary angioplasty    a. 07/2015 MV: No ischemia, EF 66%;  b. 03/2016 Inflat STEMI/PCI: LM nl, LAD 25d, RI nl, LCX nl, OM1/2 nl, RCA 80p (3.5x16 Promus Premier DES), 50p/m, 30d, RPDA 90 (2.5x12 Promu Premier DES); c. [place]/2018 [place] st[place]->Med Rx.   CHF (congestive heart failure) ([facility]   CKD (chronic kidney disease), stage III ([facility]   Diabetes mellitus without complication ([facility]   Diastolic dysfunction    a. 10/2013 Echo: EF 55-65%, Gr1 DD; b. 01/2018 [place] EF 55-60%, Gr2 DD, RVSP 4165m.   Diverticulitis    Dyspnea    Endometriosis    Family history of adverse reaction to anesthesia    Mother - had to stay in ICU because of breathing difficulties every time she had anesthesia   GERD  (gastroesophageal reflux disease)    GI bleed    a. 06/2021 - melena-->2 u PRBCs.   Hiatal hernia    History of colon polyps 08/1994   History of Migraines    Hypercholesterolemia    Hypertension    LBBB (left bundle branch block)    Osteoporosis    PAF (paroxysmal atrial fibrillation) ([facility]  a. 03/2016 Dx @ time of MI; b. CHA2DS2VASc = 5-->Eliquis.   Rheumatic fever    Sleep apnea    CPAP   Spasmodic dysphonia    ST elevation myocardial infarction (STEMI) of inferolateral wall, initial episode of care ([facility]/25/2017   ST elevation myocardial infarction (STEMI) of inferolateral wall, initial [facility] ([facility]2017   Urine incontinence    Past Surgical History:  Procedure Laterality Date   ABDOMINAL HYSTERECTOMY     ovaries not removed   APPENDECTOMY     was removed during hysterectomy   Breast biopsies     x2   BREAST EXCISIONAL BIOPSY Bilateral "years ago"   neg   BREAST SURGERY Bilateral    BROW LIFT Bilateral 08/15/2019   Procedure: BLEPHAROPLASTY UPPER EYELID; W/EXCESS SKIN BLEPHAROPTOSIS REPAIR; RESECT EX;  Surgeon: FowKarle StarchD;  Location:  [facility];  Service: Ophthalmology;  Laterality: Bilateral;  sleep apnea   CARDIAC CATHETERIZATION  2011   moderate 40% RCA disease   CARDIAC CATHETERIZATION  01/2010   Dr. Gollan@[facility] : Only noted 40% RCA   CARDIAC CATHETERIZATION N/A 03/25/2016   Procedure: Left Heart Cath and Coronary Angiography;  Surgeon: 04/07/2016, MD;  Location: [place] [facility];  Service: Cardiovascular;  Laterality: N/A;   CARDIAC CATHETERIZATION N/A 03/25/2016   Procedure: Coronary Stent Intervention;  Surgeon: 04/07/2016, MD;  Location: [place] [facility];  Service: Cardiovascular;  Laterality: N/A;   COLONOSCOPY  2013   COLONOSCOPY WITH PROPOFOL N/A 07/11/2021   Procedure: COLONOSCOPY WITH PROPOFOL;  Surgeon: 07/24/2021, MD;  Location[facility] ENDOSCOPY;  Service: Endoscopy;  Laterality: N/A;    ESOPHAGOGASTRODUODENOSCOPY (EGD) WITH PROPOFOL N/A 03/17/2015   Procedure: ESOPHAGOGASTRODUODENOSCOPY (EGD) WITH PROPOFOL;  Surgeon: 03/30/2015, MD;  Location: [facility] ENDOSCOPY;  Service: Gastroenterology;  Laterality: N/A;   ESOPHAGOGASTRODUODENOSCOPY (EGD) WITH PROPOFOL N/A 07/10/2021   Procedure: ESOPHAGOGASTRODUODENOSCOPY (EGD) WITH PROPOFOL;  Surgeon: 16/03/2022, MD;  Location[facility] END[facility]COPY;  Service: Gastroenterology;  Laterality: N/A;   GIVENS CAPSULE STUDY N/A 07/11/2021   Procedure: GIVENS CAPSULE STUDY;  Surgeon: 07/24/2021, MD;  Location: [facility] ENDOSCOPY;  Service: Endoscopy;  Laterality: N/A;   NM MYOVIEW ([place] HX)  07/2015   No evidence ischemia or infarction. EF 66%. Low risk   TONSILLECTOMY     TRANSTHORACIC ECHOCARDIOGRAM  10/2013   Normal LV size and function. EF 55-65%. GR 1 DD. Otherwise normal.   Family History  Problem Relation Age of Onset   Arthritis Mother    Stroke Mother    Hypertension Mother    Osteoporosis Mother    Heart disease Father        MI   Heart attack Father    Stroke Brother    Heart attack Sister    Breast cancer Other        first cousin x 2   Stroke Daughter    Heart attack Daughter    Colon cancer Neg Hx    Social History   Socioeconomic History   Marital status: Widowed    Spouse name: Not on file   Number of children: 2   Years of education: Not on file   Highest education level: Not on file  Occupational History   Not on file  Tobacco Use   Smoking status: Never   Smokeless tobacco: Never  Vaping Use   Vaping Use: Never used  Substance and Sexual Activity   Alcohol use: No    Alcohol/week: 0.0 standard drinks   Drug use: No   Sexual activity: Not Currently    Birth control/protection: Post-menopausal  Other Topics Concern   Not on file  Social History Narrative   Not on file   Social Determinants of Health   Financial Resource Strain: Not on file  Food Insecurity: Not on file  Transportation  Needs: Not on file  Physical Activity: Not on file  Stress: Not on file  Social Connections: Not on file     Review of Systems  Constitutional:  Negative for appetite change.       Weight is down today after more aggressive diuresis.   HENT:  Negative for congestion.   Respiratory:  Negative for cough and chest tightness.        Breathing improved some.  Still sob with exertion.  Cardiovascular:  Negative for chest pain and  palpitations.  Gastrointestinal:  Negative for nausea and vomiting.  Genitourinary:  Negative for difficulty urinating and dysuria.  Musculoskeletal:  Negative for joint swelling and myalgias.  Skin:  Negative for color change and rash.  Neurological:  Negative for dizziness, light-headedness and headaches.  Psychiatric/Behavioral:  Negative for agitation and dysphoric mood.       Objective:     BP (!) 140/56 (BP Location: Left Arm, Patient Position: Sitting, Cuff Size: Small)   Pulse 73   Temp 97.9 F (36.6 C) (Temporal)   Resp 14   Ht 4' 11"  (1.499 m)   Wt 180 lb 3.2 oz (81.7 kg)   LMP 05/01/1972   SpO2 98%   BMI 36.40 kg/m  Wt Readings from Last 3 Encounters:  09/21/21 180 lb 3.2 oz (81.7 kg)  09/16/21 192 lb 6 oz (87.3 kg)  09/07/21 187 lb (84.8 kg)    Physical Exam Vitals reviewed.  Constitutional:      General: She is not in acute distress.    Appearance: Normal appearance.  HENT:     Head: Normocephalic and atraumatic.     Right Ear: External ear normal.     Left Ear: External ear normal.  Eyes:     General: No scleral icterus.       Right eye: No discharge.        Left eye: No discharge.     Conjunctiva/sclera: Conjunctivae normal.  Neck:     Thyroid: No thyromegaly.  Cardiovascular:     Rate and Rhythm: Normal rate and regular rhythm.  Pulmonary:     Effort: No respiratory distress.     Breath sounds: Normal breath sounds. No wheezing.  Abdominal:     General: Bowel sounds are normal.     Palpations: Abdomen is soft.      Tenderness: There is no abdominal tenderness.  Musculoskeletal:        General: No swelling or tenderness.     Cervical back: Neck supple. No tenderness.     Right lower leg: Edema present.     Left lower leg: Edema present.  Lymphadenopathy:     Cervical: No cervical adenopathy.  Skin:    Findings: No erythema or rash.     Comments: Stasis changes lower extremities.   Neurological:     Mental Status: She is alert.  Psychiatric:        Mood and Affect: Mood normal.        Behavior: Behavior normal.     Outpatient Encounter Medications as of 09/21/2021  Medication Sig   acetaminophen (TYLENOL) 500 MG tablet Take 500 mg by mouth daily as needed for headache (pain).   albuterol (PROVENTIL HFA;VENTOLIN HFA) 108 (90 Base) MCG/ACT inhaler Inhale 2 puffs into the lungs every 6 (six) hours as needed for wheezing or shortness of breath.   amiodarone (PACERONE) 200 MG tablet TAKE 1 TABLET BY MOUTH DAILY   Azelastine HCl 137 MCG/SPRAY SOLN USE 1 TO 2 SPRAYS IN EACH NOSTRIL TWICE A DAY AS NEEDED FOR DRAINAGE   cholecalciferol (VITAMIN D3) 25 MCG (1000 UNIT) tablet Take 1,000 Units by mouth daily.   famotidine (PEPCID) 20 MG tablet Take 20 mg by mouth. Takes 1 to 2 times daily   fluticasone-salmeterol (ADVAIR HFA) 230-21 MCG/ACT inhaler Inhale 2 puffs into the lungs 2 (two) times daily.   Glucosamine-Chondroit-Collagen 250-200-116.67 MG CAPS Take 1 tablet by mouth in the morning and at bedtime.   hydrALAZINE (APRESOLINE) 10 MG tablet TAKE ONE TABLET  BY MOUTH THREE TIMES A DAY   metolazone (ZAROXOLYN) 2.5 MG tablet Take 1 tablet (2.5 mg total) by mouth daily. On Sat & Mon. Take it 1/2 hour before torsemide   mupirocin ointment (BACTROBAN) 2 % Apply 1 application. topically 2 (two) times daily.   nitroGLYCERIN (NITROSTAT) 0.4 MG SL tablet Place 1 tablet (0.4 mg total) under the tongue every 5 (five) minutes as needed.   Polyethyl Glycol-Propyl Glycol (SYSTANE OP) Place 1 drop into both eyes daily as  needed (dry eyes).   potassium chloride (KLOR-CON) 10 MEQ tablet Take 2 tablets (20 mEq total) by mouth daily.   Propylene Glycol (SYSTANE COMPLETE) 0.6 % SOLN Apply 1 drop to eye daily.   rosuvastatin (CRESTOR) 40 MG tablet TAKE 1 TABLET BY MOUTH DAILY   torsemide (DEMADEX) 20 MG tablet Take 2 tablets (40 mg total) by mouth daily.   traMADol (ULTRAM) 50 MG tablet Take by mouth every 6 (six) hours as needed.   No facility-administered encounter medications on file as of 09/21/2021.     Lab Results  Component Value Date   WBC 7.4 08/24/2021   HGB 10.5 (L) 08/24/2021   HCT 32.4 (L) 08/24/2021   PLT 196.0 08/24/2021   GLUCOSE 196 (H) 09/22/2021   CHOL 129 01/20/2021   TRIG 80.0 01/20/2021   HDL 50.90 01/20/2021   LDLCALC 62 01/20/2021   ALT 32 09/21/2021   AST 65 (H) 09/21/2021   NA 137 09/22/2021   K 2.7 (LL) 09/22/2021   CL 90 (L) 09/22/2021   CREATININE 2.49 (H) 09/22/2021   BUN 34 (H) 09/22/2021   CO2 32 09/22/2021   TSH 1.33 09/21/2021   INR 0.99 03/25/2016   HGBA1C 6.3 09/21/2021   MICROALBUR <0.7 04/29/2020    CT CHEST WO CONTRAST  Result Date: 09/20/2021 CLINICAL DATA:  Shortness of breath, interstitial lung disease EXAM: CT CHEST WITHOUT CONTRAST TECHNIQUE: Multidetector CT imaging of the chest was performed following the standard protocol without IV contrast. RADIATION DOSE REDUCTION: This exam was performed according to the departmental dose-optimization program which includes automated exposure control, adjustment of the mA and/or kV according to patient size and/or use of iterative reconstruction technique. COMPARISON:  Previous studies including chest radiograph done on 08/08/2021 and CT chest done on 10/21/2012 FINDINGS: Cardiovascular: Coronary artery calcifications are seen. Mediastinum/Nodes: There are subcentimeter nodes in the mediastinum with no significant interval change. Lungs/Pleura: Motion artifacts limit evaluation, more so in the right lung. As far as  seen, there is no focal pulmonary consolidation. There is minimal left pleural effusion. There is no pneumothorax. Upper Abdomen: There is large fixed hiatal hernia. There is nodularity in the liver surface suggesting cirrhosis. Small ascites is present. Musculoskeletal: Unremarkable. IMPRESSION: There is no focal pulmonary consolidation. Minimal left pleural effusion is seen. Coronary artery calcifications are seen. Cirrhosis of liver.  Small ascites.  Large fixed hiatal hernia. Electronically Signed   By: Elmer Picker M.D.   On: 09/20/2021 10:49       Assessment & Plan:   Problem List Items Addressed This Visit     Abnormal liver function tests    Recheck liver panel today.        Relevant Orders   Hepatic function panel (Completed)   Aortic atherosclerosis ([place])    Continue crestor.        Chronic heart failure with preserved ejection fraction (HCC)    Chronic lower extremity swelling.  Wanted a break from [place] boots.  Ace wraps.  Weight  as outlined. Down after IV lasix and aggressive diuresis with metolazone.  Taking torsemide as directed.  Able to zip her compression hose. Weight daily.  Avoid increased sodium intake. Check metabolic panel today.         Chronic obstructive pulmonary disease, unspecified COPD type ([place])    Breathing overall better with diuresis.  No increased cough.  Follow.         Coronary artery disease involving native coronary artery of native heart with unstable angina pectoris (HCC)    No chest pain - continues hydralazine. Off eliquis due to GI bleed.  Breathing is some better after IV lasix and aggressive diuresis.  Continues on torsemide.   Check metabolic panel today.        Diabetes mellitus ([place])    Low carb diet and exercise.  Follow met b and a1c.        GERD (gastroesophageal reflux disease)    Continue protonix.        History of colonic polyps    Had colonoscopy during recent admission as outlined.  No obvious source of  bleeding.  Follow.        Lower extremity edema    Bilateral lower extremity swelling as outlined.  Has improved some.  Wanted a break from [place] boots.  Able to zip compression hose.  Follow.        Relevant Orders   Basic metabolic panel (Completed)   Paroxysmal atrial fibrillation ([place])    Has been on eliquis and amiodarone.  Off eliquis recently secondary to GI bleed.  Followed by cardiology. Rated controlled.        SOB (shortness of breath)    Breathing is some better.  Weight is down some after diuresis.  Continue torsemide.  Have discussed possibly starting entrestro.  Check metabolic panel.  Keep f/u with cardiology on 10/03/21.        Thrombophilia (HCC)    Afib.  Off eliquis due to bleeding.  Has f/u planned with cardiology 10/03/21       Essential hypertension, benign - Primary (Chronic)    Blood pressures as outlined.  Continues torsemide and hydralazine.  Follow pressures.  Check metabolic panel today.        Relevant Orders   Basic metabolic panel (Completed)   Hypercholesterolemia (Chronic)    On crestor.  Low cholesterol diet and exercise.  Follow lipid panel and liver function tests.         Obstructive sleep apnea (Chronic)    Continue cpap.        Other Visit Diagnoses     Tremor       Relevant Orders   T4, free (Completed)   TSH (Completed)   T3, free (Completed)   Hyperglycemia       Relevant Orders   HgB A1c (Completed)        Einar Pheasant, MD

## 2021-09-22 ENCOUNTER — Other Ambulatory Visit: Payer: Self-pay

## 2021-09-22 ENCOUNTER — Telehealth: Payer: Self-pay | Admitting: Internal Medicine

## 2021-09-22 ENCOUNTER — Other Ambulatory Visit: Payer: Self-pay | Admitting: Internal Medicine

## 2021-09-22 ENCOUNTER — Other Ambulatory Visit (INDEPENDENT_AMBULATORY_CARE_PROVIDER_SITE_OTHER): Payer: Medicare Other

## 2021-09-22 DIAGNOSIS — E876 Hypokalemia: Secondary | ICD-10-CM | POA: Diagnosis not present

## 2021-09-22 DIAGNOSIS — I5032 Chronic diastolic (congestive) heart failure: Secondary | ICD-10-CM

## 2021-09-22 LAB — BASIC METABOLIC PANEL
BUN: 34 mg/dL — ABNORMAL HIGH (ref 6–23)
CO2: 32 mEq/L (ref 19–32)
Calcium: 8.8 mg/dL (ref 8.4–10.5)
Chloride: 90 mEq/L — ABNORMAL LOW (ref 96–112)
Creatinine, Ser: 2.49 mg/dL — ABNORMAL HIGH (ref 0.40–1.20)
GFR: 17.51 mL/min — ABNORMAL LOW (ref >60.00)
Glucose, Bld: 196 mg/dL — ABNORMAL HIGH (ref 70–99)
Potassium: 2.7 mEq/L — CL (ref 3.5–5.1)
Sodium: 137 mEq/L (ref 135–145)

## 2021-09-22 NOTE — Telephone Encounter (Signed)
Pt advised potassium 2.7  Will continue medication 2 BID. Scheduled for Tuesday 11am for rpt bmet

## 2021-09-22 NOTE — Telephone Encounter (Signed)
Please call and notify April [name] that her potassium is coming up.  2.7 today (up from 2.3).  continue the potassium two tablets two time per day as we discussed.  Recheck met b beginning of next week.  We are closed Monday.  See if can come in Tuesday.  Any problems before will need to be evaluated as we discussed.

## 2021-09-22 NOTE — Telephone Encounter (Signed)
CRITICAL VALUE STICKER  CRITICAL VALUE: Potassium 2.7  RECEIVER (on-site recipient of call):April [name]  DATE & TIME NOTIFIED: 09/22/2024 2:05pm  MESSENGER (representative from lab):Hope  MD NOTIFIED: Dr Nicki Reaper  TIME OF NOTIFICATION: 2:07 pm  RESPONSE:

## 2021-09-22 NOTE — Progress Notes (Signed)
Order placed for f/u lab.   

## 2021-09-26 ENCOUNTER — Encounter: Payer: Self-pay | Admitting: Internal Medicine

## 2021-09-26 NOTE — Assessment & Plan Note (Signed)
Continue crestor 

## 2021-09-26 NOTE — Assessment & Plan Note (Signed)
Has been on eliquis and amiodarone.  Off eliquis recently secondary to GI bleed.  Followed by cardiology. Rated controlled.  

## 2021-09-26 NOTE — Assessment & Plan Note (Signed)
Had colonoscopy during recent admission as outlined.  No obvious source of bleeding.  Follow.

## 2021-09-26 NOTE — Assessment & Plan Note (Signed)
Bilateral lower extremity swelling as outlined.  Has improved some.  Wanted a break from [place] boots.  Able to zip compression hose.  Follow.

## 2021-09-26 NOTE — Assessment & Plan Note (Signed)
Recheck liver panel today.  

## 2021-09-26 NOTE — Assessment & Plan Note (Signed)
Breathing overall better with diuresis.  No increased cough.  Follow.   

## 2021-09-26 NOTE — Assessment & Plan Note (Signed)
Afib.  Off eliquis due to bleeding.  Has f/u planned with cardiology 10/03/21

## 2021-09-26 NOTE — Assessment & Plan Note (Signed)
No chest pain - continues hydralazine. Off eliquis due to GI bleed.  Breathing is some better after IV lasix and aggressive diuresis.  Continues on torsemide.   Check metabolic panel today.

## 2021-09-26 NOTE — Assessment & Plan Note (Signed)
Low carb diet and exercise.  Follow met b and a1c.  

## 2021-09-26 NOTE — Assessment & Plan Note (Signed)
Breathing is some better.  Weight is down some after diuresis.  Continue torsemide.  Have discussed possibly starting entrestro.  Check metabolic panel.  Keep f/u with cardiology on 10/03/21.

## 2021-09-26 NOTE — Assessment & Plan Note (Signed)
Continue protonix  

## 2021-09-26 NOTE — Assessment & Plan Note (Signed)
On crestor.  Low cholesterol diet and exercise.  Follow lipid panel and liver function tests.   

## 2021-09-26 NOTE — Assessment & Plan Note (Signed)
Chronic lower extremity swelling.  Wanted a break from [place] boots.  Ace wraps.  Weight as outlined. Down after IV lasix and aggressive diuresis with metolazone.  Taking torsemide as directed.  Able to zip her compression hose. Weight daily.  Avoid increased sodium intake. Check metabolic panel today.

## 2021-09-26 NOTE — Assessment & Plan Note (Signed)
Continue cpap.  

## 2021-09-26 NOTE — Assessment & Plan Note (Signed)
Blood pressures as outlined.  Continues torsemide and hydralazine.  Follow pressures.  Check metabolic panel today.

## 2021-09-27 ENCOUNTER — Other Ambulatory Visit: Payer: Self-pay | Admitting: Cardiovascular Disease

## 2021-09-27 ENCOUNTER — Other Ambulatory Visit (INDEPENDENT_AMBULATORY_CARE_PROVIDER_SITE_OTHER): Payer: Medicare Other

## 2021-09-27 ENCOUNTER — Other Ambulatory Visit: Payer: Self-pay | Admitting: Internal Medicine

## 2021-09-27 DIAGNOSIS — M6281 Muscle weakness (generalized): Secondary | ICD-10-CM | POA: Diagnosis not present

## 2021-09-27 DIAGNOSIS — E876 Hypokalemia: Secondary | ICD-10-CM

## 2021-09-27 DIAGNOSIS — I5032 Chronic diastolic (congestive) heart failure: Secondary | ICD-10-CM | POA: Diagnosis not present

## 2021-09-27 DIAGNOSIS — R279 Unspecified lack of coordination: Secondary | ICD-10-CM | POA: Diagnosis not present

## 2021-09-27 DIAGNOSIS — R2689 Other abnormalities of gait and mobility: Secondary | ICD-10-CM | POA: Diagnosis not present

## 2021-09-27 DIAGNOSIS — Z9181 History of falling: Secondary | ICD-10-CM | POA: Diagnosis not present

## 2021-09-27 DIAGNOSIS — R262 Difficulty in walking, not elsewhere classified: Secondary | ICD-10-CM | POA: Diagnosis not present

## 2021-09-27 LAB — BASIC METABOLIC PANEL
BUN: 37 mg/dL — ABNORMAL HIGH (ref 6–23)
CO2: 37 mEq/L — ABNORMAL HIGH (ref 19–32)
Calcium: 8.9 mg/dL (ref 8.4–10.5)
Chloride: 89 mEq/L — ABNORMAL LOW (ref 96–112)
Creatinine, Ser: 2.42 mg/dL — ABNORMAL HIGH (ref 0.40–1.20)
GFR: 18.11 mL/min — ABNORMAL LOW (ref >60.00)
Glucose, Bld: 181 mg/dL — ABNORMAL HIGH (ref 70–99)
Potassium: 2.5 mEq/L — CL (ref 3.5–5.1)
Sodium: 137 mEq/L (ref 135–145)

## 2021-09-27 MED ORDER — POTASSIUM CHLORIDE CRYS ER 20 MEQ PO TBCR: EXTENDED_RELEASE_TABLET | ORAL | 0 refills | Status: DC

## 2021-09-27 NOTE — Progress Notes (Signed)
Rx sent in for 96mq of kcl with instructions to take 2 bid.

## 2021-09-28 NOTE — Telephone Encounter (Signed)
Looks like the Potassium was refilled by Dr. Nicki Reaper yesterday 09/17/21.

## 2021-09-29 ENCOUNTER — Other Ambulatory Visit (INDEPENDENT_AMBULATORY_CARE_PROVIDER_SITE_OTHER): Payer: Medicare Other

## 2021-09-29 DIAGNOSIS — E876 Hypokalemia: Secondary | ICD-10-CM

## 2021-09-29 DIAGNOSIS — N1831 Chronic kidney disease, stage 3a: Secondary | ICD-10-CM

## 2021-09-29 DIAGNOSIS — R0602 Shortness of breath: Secondary | ICD-10-CM | POA: Diagnosis not present

## 2021-09-29 DIAGNOSIS — M6281 Muscle weakness (generalized): Secondary | ICD-10-CM | POA: Diagnosis not present

## 2021-09-29 LAB — BASIC METABOLIC PANEL
BUN: 35 mg/dL — ABNORMAL HIGH (ref 6–23)
CO2: 32 mEq/L (ref 19–32)
Calcium: 8.9 mg/dL (ref 8.4–10.5)
Chloride: 93 mEq/L — ABNORMAL LOW (ref 96–112)
Creatinine, Ser: 2.24 mg/dL — ABNORMAL HIGH (ref 0.40–1.20)
GFR: 19.87 mL/min — ABNORMAL LOW (ref >60.00)
Glucose, Bld: 113 mg/dL — ABNORMAL HIGH (ref 70–99)
Potassium: 4 mEq/L (ref 3.5–5.1)
Sodium: 137 mEq/L (ref 135–145)

## 2021-09-30 ENCOUNTER — Other Ambulatory Visit: Payer: Self-pay | Admitting: Internal Medicine

## 2021-09-30 DIAGNOSIS — E876 Hypokalemia: Secondary | ICD-10-CM

## 2021-09-30 NOTE — Progress Notes (Signed)
Order placed for f/u potassium.  

## 2021-10-02 NOTE — Progress Notes (Unsigned)
Cardiology Office Note  Date:  10/03/2021   ID:  AVY BARLETT, DOB 01-07-1941, MRN [id number]  PCP:  Einar Pheasant, MD   Chief Complaint  Patient presents with   6 month follow up     Patient c/o bilateral LE edema, fatigue and shortness of breath with little to no exertion. Medications reviewed by the patient verbally.     HPI:  April [name] is a pleasant 81 year-old woman with history of  coronary artery disease,  chronic shortness of breath  STEMI November 2017 stent x2 to her RCA atrial fibrillation while in the hospital November 2017 elevated CHADS VASC (at least 4) who presents for follow-up of her coronary artery disease , paroxysmal atrial fib, lymphedema  Recent events reviewed in detail [facility] in 06/2021 with symptomatic anemia with melena. HGB dropped to 7.2.  She required 2 units pRBC and Eliquis was held. EGD was performed and showed mildly erythematous gastric mucosa without obvious bleeding.  This was followed by colonoscopy which showed no source of bleeding, polyps (pathology negative for malignancy), nonbleeding internal hemorrhoids, and diverticulosis.Capsule endoscopy was subsequently carried out.  EGD biopsies were negative for H. pylori. She was discharged home off of Eliquis. She was seen by GI on 07/21/2021, at which time H&H were stable. Note indicates that she was cleared to resume anticoagulation.    She was seen in our office on 07/26/2021, and was noted to be volume up with associated lower extremity swelling and dyspnea.  She reported a 10 pound weight gain.  It was noted she remained net positive throughout her prior admission in the context of IV fluids, PRBC, and holding of diuretic therapy because of AKI.  She had been alternating Lasix 40 mg daily with 40 mg twice daily without significant improvement in symptoms.  In an effort to attempt to prevent readmission, her Lasix was titrated for 3 days to 80 mg twice daily followed by 40 mg twice daily thereafter.  She  was restarted on Eliquis 5 mg twice daily.  She was treated for lower extremity cellulitis as well.   She was admitted to [facility] in early April with recurrent melena as well as a small amount of blood-streaked reflux leading her Eliquis to be held at that time. She was evaluated by GI and underwent enteroscopy and capsule endoscopy which showed a large hiatal hernia, patchy erythematous and petechial mucosa with erosion in the stomach, the duodenum, and jejunum appeared normal.  H. pylori stool antigen was negative. GI subsequently cleared the patient to resume Eliquis, though this was held at discharge, at patient request. She contacted our office on 4/6 concerned about this, after discussion with her primary cardiologist, it was recommended she resume Eliquis at 2.5 mg bid (now met reduced dosing criteria), and hold ASA.    She was admitted to [facility] on 08/08/2021-4/17 with acute on chronic HFpEF, recurrent melena, and anemia.  She reported an approximate 15 to 20 pound weight gain.  High sensitivity troponin 15 with a delta troponin of 16. BNP 1032. HGB 8.2, which is similar to her discharge value at [facility] of 8.8. BUN/SCr 29/1.87-29/1.79.  EKG showed sinus bradycardia with known left bundle branch block.  Chest x-ray showed mild pulmonary interstitial edema with new small bilateral pleural effusions as well as a stable large hiatal hernia.  In the ED, she received IV Lasix 40 mg and was placed on IV Lasix 60 mg daily upon admission. Net -11.5L during admission. She was transfused  2 units PRBCs . Remained in NSR with amiodarone. She was d/c'd on torsemide 40 in the AM and and 20 in the pm. Weight at home 180 when she got home   She was seen in the office 08/29/21 and was feeling weak with persistent SOB and LLE. Also with lymphedema. She was in NSR on propranolol and amiodarone. PRI was 296 and propranolol was stopped. Torsemide was increased to 31m BID. She was referred to EP for watchman  consideration.  Weight stable at home Has noticed some increasing shortness of breath Worsening lymphedema Was in wraps, now off, swelling getting worse Uses lymph pumps at home also ted hose with zipper  On torsemide 20 BID, reduced dose presumably for renal dysfunction down from 40 twice daily Taking potassium 20 meq BID Recent lab work confirming potassium in 4 range Still with significant leg swelling Worsening tremor for months, good days and bad days muscle fatigue, walks with cane/walker  Denies chest pain concerning for angina No tachypalpitations concerning for arrhythmia Some mild SOB  Echo 4/23 reviewed  1. Left ventricular ejection fraction, by estimation, is 60 to 65%. The  left ventricle has normal function. The left ventricle has no regional  wall motion abnormalities. Left ventricular diastolic parameters are  consistent with Grade II diastolic  dysfunction (pseudonormalization).   2. Right ventricular systolic function is normal. The right ventricular  size is mildly enlarged. There is mildly elevated pulmonary artery  systolic pressure. The estimated right ventricular systolic pressure is  421.2mmHg.   Recent lab work reviewed A1C 6.1 CR 1.03  EKG personally reviewed by myself on todays visit Shows sinus bradycardia rate 76 bpm left bundle branch block  Other past medical history reviewed Prior stenting details from 2017 CAD S/P PCI pRCA Promus DES 3.5 x 16 (4.1 mm), ostRPDA Promus DES 2.5 x 12 (2.7 mm)  [facility]  Chronic weakness with speech Previously completed speech therapy   chronic back pain Previously seen by Dr. JArnoldo Moralein GTonasket chest pain while she was at the beach 2019 Took nitroglycerin x2, did not seem to help Went to urgent care initially Was sent to hospital [facility] 0.07 down to 0.049 on October 7 and 8   Echo  1. Normal  biventricular systolic function and size. LVEF 55-60%.  2. No significant valvular dysfunction.  3. Grade II diastolic dysfunction with elevated left-sided filling  pressures.  4. RVSP 41 mmHg consistent with mild pulmonary hypertension  Full cardiac catheterization report is not available but discharge summary reports stents were patent to the RCA   PMH:   has a past medical history of Anemia, CAD S/P PCI pRCA Promus DES 3.5 x 16 (4.1 mm), ostRPDA Promus DES 2.5 x 12 (2.7 mm) (03/25/2016), CAD S/P percutaneous coronary angioplasty, CHF (congestive heart failure) ([place], CKD (chronic kidney disease), stage III ([place], Diabetes mellitus without complication ([place], Diastolic dysfunction, Diverticulitis, Dyspnea, Endometriosis, Family history of adverse reaction to anesthesia, GERD (gastroesophageal reflux disease), GI bleed, Hiatal hernia, History of colon polyps (08/1994), History of Migraines, Hypercholesterolemia, Hypertension, LBBB (left bundle branch block), Osteoporosis, PAF (paroxysmal atrial fibrillation) ([place], Rheumatic fever, Sleep apnea, Spasmodic dysphonia, ST elevation myocardial infarction (STEMI) of inferolateral wall, initial episode of care ([place] (03/25/2016), ST elevation myocardial infarction (STEMI) of inferolateral wall, initial episode of care ([place] (03/25/2016), and Urine incontinence.  PSH:    Past Surgical History:  Procedure Laterality Date  ABDOMINAL HYSTERECTOMY     ovaries not removed   APPENDECTOMY     was removed during hysterectomy   Breast biopsies     x2   BREAST EXCISIONAL BIOPSY Bilateral "years ago"   neg   BREAST SURGERY Bilateral    BROW LIFT Bilateral 08/15/2019   Procedure: BLEPHAROPLASTY UPPER EYELID; W/EXCESS SKIN BLEPHAROPTOSIS REPAIR; RESECT EX;  Surgeon: Karle Starch, MD;  Location: [place];  Service: Ophthalmology;  Laterality: Bilateral;  sleep apnea   CARDIAC CATHETERIZATION  2011   moderate 40% RCA disease   CARDIAC  CATHETERIZATION  01/2010   Dr. Eleazar Kimmey@[email or web address] : Only noted 40% RCA   CARDIAC CATHETERIZATION N/A 03/25/2016   Procedure: Left Heart Cath and Coronary Angiography;  Surgeon: 04/07/2016, MD;  Location: [place] CV LAB;  Service: Cardiovascular;  Laterality: N/A;   CARDIAC CATHETERIZATION N/A 03/25/2016   Procedure: Coronary Stent Intervention;  Surgeon: 04/07/2016, MD;  Location: [place] CV LAB;  Service: Cardiovascular;  Laterality: N/A;   COLONOSCOPY  2013   COLONOSCOPY WITH PROPOFOL N/A 07/11/2021   Procedure: COLONOSCOPY WITH PROPOFOL;  Surgeon: 07/24/2021, MD;  Location: [facility] ENDOSCOPY;  Service: Endoscopy;  Laterality: N/A;   ESOPHAGOGASTRODUODENOSCOPY (EGD) WITH PROPOFOL N/A 03/17/2015   Procedure: ESOPHAGOGASTRODUODENOSCOPY (EGD) WITH PROPOFOL;  Surgeon: 03/30/2015, MD;  Location: [facility] ENDOSCOPY;  Service: Gastroenterology;  Laterality: N/A;   ESOPHAGOGASTRODUODENOSCOPY (EGD) WITH PROPOFOL N/A 07/10/2021   Procedure: ESOPHAGOGASTRODUODENOSCOPY (EGD) WITH PROPOFOL;  Surgeon: 16/03/2022, MD;  Location: [facility] ENDOSCOPY;  Service: Gastroenterology;  Laterality: N/A;   GIVENS CAPSULE STUDY N/A 07/11/2021   Procedure: GIVENS CAPSULE STUDY;  Surgeon: 07/24/2021, MD;  Location[facility] ENDOSCOPY;  Service: Endoscopy;  Laterality: N/A;   NM MYOVIEW ([place] HX)  07/2015   No evidence ischemia or infarction. EF 66%. Low risk   TONSILLECTOMY     TRANSTHORACIC ECHOCARDIOGRAM  10/2013   Normal LV size and function. EF 55-65%. GR 1 DD. Otherwise normal.    Current Outpatient Medications  Medication Sig Dispense Refill   acetaminophen (TYLENOL) 500 MG tablet Take 500 mg by mouth daily as needed for headache (pain).     albuterol (PROVENTIL HFA;VENTOLIN HFA) 108 (90 Base) MCG/ACT inhaler Inhale 2 puffs into the lungs every 6 (six) hours as needed for wheezing or shortness of breath. 1 Inhaler 6   amiodarone (PACERONE) 200 MG tablet TAKE 1 TABLET BY MOUTH DAILY 90 tablet 0    Azelastine HCl 137 MCG/SPRAY SOLN USE 1 TO 2 SPRAYS IN EACH NOSTRIL TWICE A DAY AS NEEDED FOR DRAINAGE 90 mL 0   cholecalciferol (VITAMIN D3) 25 MCG (1000 UNIT) tablet Take 1,000 Units by mouth daily.     famotidine (PEPCID) 20 MG tablet Take 20 mg by mouth. Takes 1 to 2 times daily     fluticasone-salmeterol (ADVAIR HFA) 230-21 MCG/ACT inhaler Inhale 2 puffs into the lungs 2 (two) times daily. 1 each 12   Glucosamine-Chondroit-Collagen 250-200-116.67 MG CAPS Take 1 tablet by mouth in the morning and at bedtime.     hydrALAZINE (APRESOLINE) 10 MG tablet TAKE ONE TABLET BY MOUTH THREE TIMES A DAY 270 tablet 1   mupirocin ointment (BACTROBAN) 2 % Apply 1 application. topically 2 (two) times daily.     nitroGLYCERIN (NITROSTAT) 0.4 MG SL tablet Place 1 tablet (0.4 mg total) under the tongue every 5 (five) minutes as needed. 25 tablet 0   Polyethyl Glycol-Propyl Glycol (SYSTANE OP) Place 1 drop into both eyes daily  as needed (dry eyes).     potassium chloride SA (KLOR-CON M) 20 MEQ tablet Take 2 tablets 2x/day 120 tablet 0   Propylene Glycol (SYSTANE COMPLETE) 0.6 % SOLN Apply 1 drop to eye daily.     rosuvastatin (CRESTOR) 40 MG tablet TAKE 1 TABLET BY MOUTH DAILY 90 tablet 0   torsemide (DEMADEX) 20 MG tablet Take 2 tablets (40 mg total) by mouth daily.     traMADol (ULTRAM) 50 MG tablet Take by mouth every 6 (six) hours as needed.     No current facility-administered medications for this visit.    Allergies:   Penicillins and Sulfa antibiotics   Social History:  The patient  reports that she has never smoked. She has never used smokeless tobacco. She reports that she does not drink alcohol and does not use drugs.   Family History:   family history includes Arthritis in her mother; Breast cancer in an other family member; Heart attack in her daughter, father, and sister; Heart disease in her father; Hypertension in her mother; Osteoporosis in her mother; Stroke in her brother, daughter, and  mother.   Review of Systems: Review of Systems  HENT: Negative.    Respiratory:  Positive for shortness of breath.   Cardiovascular:  Positive for leg swelling.  Gastrointestinal: Negative.   Musculoskeletal: Negative.   Neurological: Negative.   Psychiatric/Behavioral: Negative.    All other systems reviewed and are negative.  PHYSICAL EXAM: VS:  BP (!) 130/48 (BP Location: Left Arm, Patient Position: Sitting, Cuff Size: Normal)   Pulse 76   Ht 4' 11"  (1.499 m)   Wt 183 lb 6 oz (83.2 kg)   LMP 05/01/1972   SpO2 98%   BMI 37.04 kg/m  , BMI Body mass index is 37.04 kg/m. Constitutional:  oriented to person, place, and time. No distress.  HENT:  Head: Grossly normal Eyes:  no discharge. No scleral icterus.  Neck: No JVD, no carotid bruits  Cardiovascular: Regular rate and rhythm, no murmurs appreciated 2+ edema, Severe lymphedema  Pulmonary/Chest: Clear to auscultation bilaterally, no wheezes or rails Abdominal: Soft.  no distension.  no tenderness.  Musculoskeletal: Normal range of motion Neurological:  normal muscle tone. Coordination normal. No atrophy Skin: Skin warm and dry Psychiatric: normal affect, pleasant  Recent Labs: 08/16/2021: Magnesium 2.1 08/24/2021: Hemoglobin 10.5; Platelets 196.0 09/15/2021: B Natriuretic Peptide 363.9 09/21/2021: ALT 32; TSH 1.33 09/29/2021: BUN 35; Creatinine, Ser 2.24; Potassium 4.0; Sodium 137    Lipid Panel Lab Results  Component Value Date   CHOL 129 01/20/2021   HDL 50.90 01/20/2021   LDLCALC 62 01/20/2021   TRIG 80.0 01/20/2021      Wt Readings from Last 3 Encounters:  10/03/21 183 lb 6 oz (83.2 kg)  09/21/21 180 lb 3.2 oz (81.7 kg)  09/16/21 192 lb 6 oz (87.3 kg)     ASSESSMENT AND PLAN:  Essential hypertension, benign Blood pressure is well controlled on today's visit. No changes made to the medications.  Hypercholesterolemia Cholesterol is at goal on the current lipid regimen. No changes to the medications were  made.  Coronary artery disease involving native coronary artery of native heart with unstable angina pectoris ([place]) Currently with no symptoms of angina. No further workup at this time. Continue current medication regimen.  Paroxysmal atrial fibrillation Tolerating anticoagulation, maintain normal sinus rhythm On amiodarone,  it appears propranolol previously held for prolonged PR interval elevated CHADS VASC (at least 4) Eliquis on hold in the setting of  recurrent GI bleed Scheduled to see EP in July for consideration of Watchman device  SOB (shortness of breath) Recommend weight loss, walking for conditioning Normal ejection fraction on echo Remains on torsemide 20 twice daily with extra torsemide as needed for shortness of breath or worsening leg swelling  Lymphedema Recommend she use her lymphedema compression pumps 1 hour twice a day Followed by wound clinic  Total encounter time more than 30 minutes Greater than 50% was spent in counseling and coordination of care with the patient    Orders Placed This Encounter  Procedures   EKG 12-Lead     Signed, April [name], M.D., Ph.D. 10/03/2021  [place], [place]

## 2021-10-03 ENCOUNTER — Ambulatory Visit (INDEPENDENT_AMBULATORY_CARE_PROVIDER_SITE_OTHER): Payer: Medicare Other | Admitting: Cardiovascular Disease

## 2021-10-03 ENCOUNTER — Ambulatory Visit: Payer: Medicare Other | Admitting: Family

## 2021-10-03 ENCOUNTER — Encounter: Payer: Self-pay | Admitting: Cardiovascular Disease

## 2021-10-03 VITALS — BP 130/48 | HR 76 | Ht 59.0 in | Wt 183.4 lb

## 2021-10-03 DIAGNOSIS — I700 Atherosclerosis of aorta: Secondary | ICD-10-CM

## 2021-10-03 DIAGNOSIS — G4733 Obstructive sleep apnea (adult) (pediatric): Secondary | ICD-10-CM | POA: Diagnosis not present

## 2021-10-03 DIAGNOSIS — D649 Anemia, unspecified: Secondary | ICD-10-CM | POA: Diagnosis not present

## 2021-10-03 DIAGNOSIS — I10 Essential (primary) hypertension: Secondary | ICD-10-CM

## 2021-10-03 DIAGNOSIS — I2510 Atherosclerotic heart disease of native coronary artery without angina pectoris: Secondary | ICD-10-CM | POA: Diagnosis not present

## 2021-10-03 DIAGNOSIS — E1122 Type 2 diabetes mellitus with diabetic chronic kidney disease: Secondary | ICD-10-CM | POA: Diagnosis not present

## 2021-10-03 DIAGNOSIS — Z9861 Coronary angioplasty status: Secondary | ICD-10-CM | POA: Diagnosis not present

## 2021-10-03 DIAGNOSIS — I480 Paroxysmal atrial fibrillation: Secondary | ICD-10-CM | POA: Diagnosis not present

## 2021-10-03 DIAGNOSIS — I5033 Acute on chronic diastolic (congestive) heart failure: Secondary | ICD-10-CM

## 2021-10-03 DIAGNOSIS — N184 Chronic kidney disease, stage 4 (severe): Secondary | ICD-10-CM | POA: Diagnosis not present

## 2021-10-03 DIAGNOSIS — N183 Chronic kidney disease, stage 3 unspecified: Secondary | ICD-10-CM | POA: Diagnosis not present

## 2021-10-03 DIAGNOSIS — I25110 Atherosclerotic heart disease of native coronary artery with unstable angina pectoris: Secondary | ICD-10-CM

## 2021-10-03 NOTE — Patient Instructions (Addendum)
Medication Instructions:  No changes Stay on torsemide 20 twice a day Extra torsemide as needed for shortness of breath  If you need a refill on your cardiac medications before your next appointment, please call your pharmacy.   Lab work: No new labs needed  Testing/Procedures: No new testing needed  Follow-Up: At [facility], you and your health needs are our priority.  As part of our continuing mission to provide you with exceptional heart care, we have created designated Provider Care Teams.  These Care Teams include your primary Cardiologist (physician) and Advanced Practice Providers (APPs -  Physician Assistants and Nurse Practitioners) who all work together to provide you with the care you need, when you need it.  You will need a follow up appointment in 6 months  Providers on your designated Care Team:   Murray Hodgkins, NP Christell Faith, PA-C Cadence Kathlen Mody, [place]  COVID-19 Vaccine Information can be found at: [email or web address].uk For questions related to vaccine distribution or appointments, please email [email or web address] or call [phone].

## 2021-10-04 DIAGNOSIS — G20 Parkinson's disease: Secondary | ICD-10-CM | POA: Diagnosis not present

## 2021-10-04 DIAGNOSIS — I480 Paroxysmal atrial fibrillation: Secondary | ICD-10-CM | POA: Diagnosis not present

## 2021-10-04 DIAGNOSIS — I5032 Chronic diastolic (congestive) heart failure: Secondary | ICD-10-CM | POA: Diagnosis not present

## 2021-10-04 DIAGNOSIS — R262 Difficulty in walking, not elsewhere classified: Secondary | ICD-10-CM | POA: Diagnosis not present

## 2021-10-04 DIAGNOSIS — R279 Unspecified lack of coordination: Secondary | ICD-10-CM | POA: Diagnosis not present

## 2021-10-04 DIAGNOSIS — M6281 Muscle weakness (generalized): Secondary | ICD-10-CM | POA: Diagnosis not present

## 2021-10-04 DIAGNOSIS — R2689 Other abnormalities of gait and mobility: Secondary | ICD-10-CM | POA: Diagnosis not present

## 2021-10-04 DIAGNOSIS — Z9181 History of falling: Secondary | ICD-10-CM | POA: Diagnosis not present

## 2021-10-06 ENCOUNTER — Other Ambulatory Visit (INDEPENDENT_AMBULATORY_CARE_PROVIDER_SITE_OTHER): Payer: Medicare Other

## 2021-10-06 ENCOUNTER — Other Ambulatory Visit: Payer: Self-pay | Admitting: Lab

## 2021-10-06 DIAGNOSIS — I129 Hypertensive chronic kidney disease with stage 1 through stage 4 chronic kidney disease, or unspecified chronic kidney disease: Secondary | ICD-10-CM | POA: Diagnosis not present

## 2021-10-06 DIAGNOSIS — I5032 Chronic diastolic (congestive) heart failure: Secondary | ICD-10-CM | POA: Diagnosis not present

## 2021-10-06 DIAGNOSIS — E876 Hypokalemia: Secondary | ICD-10-CM

## 2021-10-06 DIAGNOSIS — R600 Localized edema: Secondary | ICD-10-CM | POA: Diagnosis not present

## 2021-10-06 DIAGNOSIS — N184 Chronic kidney disease, stage 4 (severe): Secondary | ICD-10-CM | POA: Diagnosis not present

## 2021-10-07 LAB — BASIC METABOLIC PANEL
BUN: 26 mg/dL — ABNORMAL HIGH (ref 6–23)
CO2: 23 mEq/L (ref 19–32)
Calcium: 8.5 mg/dL (ref 8.4–10.5)
Chloride: 102 mEq/L (ref 96–112)
Creatinine, Ser: 2.31 mg/dL — ABNORMAL HIGH (ref 0.40–1.20)
GFR: 19.15 mL/min — ABNORMAL LOW (ref >60.00)
Glucose, Bld: 141 mg/dL — ABNORMAL HIGH (ref 70–99)
Potassium: 4.5 mEq/L (ref 3.5–5.1)
Sodium: 136 mEq/L (ref 135–145)

## 2021-10-10 DIAGNOSIS — R600 Localized edema: Secondary | ICD-10-CM | POA: Diagnosis not present

## 2021-10-10 DIAGNOSIS — E876 Hypokalemia: Secondary | ICD-10-CM | POA: Diagnosis not present

## 2021-10-10 DIAGNOSIS — N184 Chronic kidney disease, stage 4 (severe): Secondary | ICD-10-CM | POA: Diagnosis not present

## 2021-10-10 DIAGNOSIS — I129 Hypertensive chronic kidney disease with stage 1 through stage 4 chronic kidney disease, or unspecified chronic kidney disease: Secondary | ICD-10-CM | POA: Diagnosis not present

## 2021-10-10 DIAGNOSIS — I890 Lymphedema, not elsewhere classified: Secondary | ICD-10-CM | POA: Diagnosis not present

## 2021-10-11 DIAGNOSIS — Z9181 History of falling: Secondary | ICD-10-CM | POA: Diagnosis not present

## 2021-10-11 DIAGNOSIS — R2689 Other abnormalities of gait and mobility: Secondary | ICD-10-CM | POA: Diagnosis not present

## 2021-10-11 DIAGNOSIS — K449 Diaphragmatic hernia without obstruction or gangrene: Secondary | ICD-10-CM | POA: Diagnosis not present

## 2021-10-11 DIAGNOSIS — M6281 Muscle weakness (generalized): Secondary | ICD-10-CM | POA: Diagnosis not present

## 2021-10-11 DIAGNOSIS — I5032 Chronic diastolic (congestive) heart failure: Secondary | ICD-10-CM | POA: Diagnosis not present

## 2021-10-11 DIAGNOSIS — R262 Difficulty in walking, not elsewhere classified: Secondary | ICD-10-CM | POA: Diagnosis not present

## 2021-10-11 DIAGNOSIS — R279 Unspecified lack of coordination: Secondary | ICD-10-CM | POA: Diagnosis not present

## 2021-10-12 ENCOUNTER — Other Ambulatory Visit: Payer: Self-pay | Admitting: Internal Medicine

## 2021-10-12 DIAGNOSIS — D509 Iron deficiency anemia, unspecified: Secondary | ICD-10-CM

## 2021-10-12 DIAGNOSIS — R7989 Other specified abnormal findings of blood chemistry: Secondary | ICD-10-CM

## 2021-10-12 NOTE — Progress Notes (Signed)
Orders placed for f/u labs.  

## 2021-10-13 ENCOUNTER — Other Ambulatory Visit (INDEPENDENT_AMBULATORY_CARE_PROVIDER_SITE_OTHER): Payer: Medicare Other

## 2021-10-13 DIAGNOSIS — D509 Iron deficiency anemia, unspecified: Secondary | ICD-10-CM

## 2021-10-13 DIAGNOSIS — I5032 Chronic diastolic (congestive) heart failure: Secondary | ICD-10-CM | POA: Diagnosis not present

## 2021-10-13 DIAGNOSIS — R7989 Other specified abnormal findings of blood chemistry: Secondary | ICD-10-CM | POA: Diagnosis not present

## 2021-10-13 DIAGNOSIS — R279 Unspecified lack of coordination: Secondary | ICD-10-CM | POA: Diagnosis not present

## 2021-10-13 DIAGNOSIS — Z9181 History of falling: Secondary | ICD-10-CM | POA: Diagnosis not present

## 2021-10-13 DIAGNOSIS — R262 Difficulty in walking, not elsewhere classified: Secondary | ICD-10-CM | POA: Diagnosis not present

## 2021-10-13 DIAGNOSIS — R2689 Other abnormalities of gait and mobility: Secondary | ICD-10-CM | POA: Diagnosis not present

## 2021-10-13 DIAGNOSIS — M6281 Muscle weakness (generalized): Secondary | ICD-10-CM | POA: Diagnosis not present

## 2021-10-13 LAB — CBC WITH DIFFERENTIAL/PLATELET
Basophils Absolute: 0 10*3/uL (ref 0.0–0.1)
Basophils Relative: 0.6 % (ref 0.0–3.0)
Eosinophils Absolute: 0 10*3/uL (ref 0.0–0.7)
Eosinophils Relative: 0 % (ref 0.0–5.0)
HCT: 29.6 % — ABNORMAL LOW (ref 36.0–46.0)
Hemoglobin: 9.8 g/dL — ABNORMAL LOW (ref 12.0–15.0)
Lymphocytes Relative: 27.5 % (ref 12.0–46.0)
Lymphs Abs: 2.1 10*3/uL (ref 0.7–4.0)
MCHC: 33.1 g/dL (ref 30.0–36.0)
MCV: 88.2 fl (ref 78.0–100.0)
Monocytes Absolute: 1.7 10*3/uL — ABNORMAL HIGH (ref 0.1–1.0)
Monocytes Relative: 21.6 % — ABNORMAL HIGH (ref 3.0–12.0)
Neutro Abs: 3.9 10*3/uL (ref 1.4–7.7)
Neutrophils Relative %: 50.3 % (ref 43.0–77.0)
Platelets: 188 10*3/uL (ref 150.0–400.0)
RBC: 3.36 Mil/uL — ABNORMAL LOW (ref 3.87–5.11)
RDW: 19.3 % — ABNORMAL HIGH (ref 11.5–15.5)
WBC: 7.7 10*3/uL (ref 4.0–10.5)

## 2021-10-13 LAB — HEPATIC FUNCTION PANEL
ALT: 36 U/L — ABNORMAL HIGH (ref 0–35)
AST: 70 U/L — ABNORMAL HIGH (ref 0–37)
Albumin: 2.9 g/dL — ABNORMAL LOW (ref 3.5–5.2)
Alkaline Phosphatase: 125 U/L — ABNORMAL HIGH (ref 39–117)
Bilirubin, Direct: 0.4 mg/dL — ABNORMAL HIGH (ref 0.0–0.3)
Total Bilirubin: 1.4 mg/dL — ABNORMAL HIGH (ref 0.2–1.2)
Total Protein: 6.2 g/dL (ref 6.0–8.3)

## 2021-10-13 LAB — IBC + FERRITIN
Ferritin: 24.4 ng/mL (ref 10.0–291.0)
Iron: 54 ug/dL (ref 42–145)
Saturation Ratios: 16.6 % — ABNORMAL LOW (ref 20.0–50.0)
TIBC: 326.2 ug/dL (ref 250.0–450.0)
Transferrin: 233 mg/dL (ref 212.0–360.0)

## 2021-10-17 ENCOUNTER — Other Ambulatory Visit: Payer: Self-pay

## 2021-10-17 ENCOUNTER — Emergency Department: Payer: Medicare Other

## 2021-10-17 ENCOUNTER — Inpatient Hospital Stay
Admission: EM | Admit: 2021-10-17 | Discharge: 2021-10-21 | DRG: 640 | Disposition: A | Discharge status: Home Health | Payer: Medicare Other | Attending: Internal Medicine | Admitting: Internal Medicine

## 2021-10-17 DIAGNOSIS — Z8719 Personal history of other diseases of the digestive system: Secondary | ICD-10-CM | POA: Diagnosis not present

## 2021-10-17 DIAGNOSIS — I959 Hypotension, unspecified: Secondary | ICD-10-CM | POA: Diagnosis not present

## 2021-10-17 DIAGNOSIS — Z95818 Presence of other cardiac implants and grafts: Secondary | ICD-10-CM

## 2021-10-17 DIAGNOSIS — Z9861 Coronary angioplasty status: Secondary | ICD-10-CM | POA: Diagnosis not present

## 2021-10-17 DIAGNOSIS — I130 Hypertensive heart and chronic kidney disease with heart failure and stage 1 through stage 4 chronic kidney disease, or unspecified chronic kidney disease: Secondary | ICD-10-CM | POA: Diagnosis present

## 2021-10-17 DIAGNOSIS — J449 Chronic obstructive pulmonary disease, unspecified: Secondary | ICD-10-CM | POA: Diagnosis not present

## 2021-10-17 DIAGNOSIS — Z6834 Body mass index (BMI) 34.0-34.9, adult: Secondary | ICD-10-CM | POA: Diagnosis not present

## 2021-10-17 DIAGNOSIS — Z20822 Contact with and (suspected) exposure to covid-19: Secondary | ICD-10-CM | POA: Diagnosis present

## 2021-10-17 DIAGNOSIS — E669 Obesity, unspecified: Secondary | ICD-10-CM | POA: Diagnosis present

## 2021-10-17 DIAGNOSIS — Z90710 Acquired absence of both cervix and uterus: Secondary | ICD-10-CM

## 2021-10-17 DIAGNOSIS — K219 Gastro-esophageal reflux disease without esophagitis: Secondary | ICD-10-CM | POA: Diagnosis present

## 2021-10-17 DIAGNOSIS — Z87898 Personal history of other specified conditions: Secondary | ICD-10-CM

## 2021-10-17 DIAGNOSIS — I2510 Atherosclerotic heart disease of native coronary artery without angina pectoris: Secondary | ICD-10-CM | POA: Diagnosis not present

## 2021-10-17 DIAGNOSIS — I252 Old myocardial infarction: Secondary | ICD-10-CM

## 2021-10-17 DIAGNOSIS — R0609 Other forms of dyspnea: Secondary | ICD-10-CM

## 2021-10-17 DIAGNOSIS — Z955 Presence of coronary angioplasty implant and graft: Secondary | ICD-10-CM

## 2021-10-17 DIAGNOSIS — Z823 Family history of stroke: Secondary | ICD-10-CM

## 2021-10-17 DIAGNOSIS — E876 Hypokalemia: Secondary | ICD-10-CM | POA: Diagnosis not present

## 2021-10-17 DIAGNOSIS — I5032 Chronic diastolic (congestive) heart failure: Secondary | ICD-10-CM | POA: Diagnosis present

## 2021-10-17 DIAGNOSIS — R0602 Shortness of breath: Secondary | ICD-10-CM

## 2021-10-17 DIAGNOSIS — N1832 Chronic kidney disease, stage 3b: Secondary | ICD-10-CM | POA: Diagnosis not present

## 2021-10-17 DIAGNOSIS — E878 Other disorders of electrolyte and fluid balance, not elsewhere classified: Secondary | ICD-10-CM | POA: Diagnosis not present

## 2021-10-17 DIAGNOSIS — E874 Mixed disorder of acid-base balance: Secondary | ICD-10-CM | POA: Diagnosis present

## 2021-10-17 DIAGNOSIS — E871 Hypo-osmolality and hyponatremia: Secondary | ICD-10-CM | POA: Diagnosis present

## 2021-10-17 DIAGNOSIS — I890 Lymphedema, not elsewhere classified: Secondary | ICD-10-CM | POA: Diagnosis not present

## 2021-10-17 DIAGNOSIS — N170 Acute kidney failure with tubular necrosis: Secondary | ICD-10-CM | POA: Diagnosis not present

## 2021-10-17 DIAGNOSIS — T501X5A Adverse effect of loop [high-ceiling] diuretics, initial encounter: Secondary | ICD-10-CM | POA: Diagnosis present

## 2021-10-17 DIAGNOSIS — Z803 Family history of malignant neoplasm of breast: Secondary | ICD-10-CM

## 2021-10-17 DIAGNOSIS — T502X5A Adverse effect of carbonic-anhydrase inhibitors, benzothiadiazides and other diuretics, initial encounter: Secondary | ICD-10-CM | POA: Diagnosis present

## 2021-10-17 DIAGNOSIS — E7800 Pure hypercholesterolemia, unspecified: Secondary | ICD-10-CM | POA: Diagnosis present

## 2021-10-17 DIAGNOSIS — E1122 Type 2 diabetes mellitus with diabetic chronic kidney disease: Secondary | ICD-10-CM | POA: Diagnosis present

## 2021-10-17 DIAGNOSIS — I480 Paroxysmal atrial fibrillation: Secondary | ICD-10-CM | POA: Diagnosis not present

## 2021-10-17 DIAGNOSIS — N179 Acute kidney failure, unspecified: Secondary | ICD-10-CM | POA: Diagnosis present

## 2021-10-17 DIAGNOSIS — J9601 Acute respiratory failure with hypoxia: Secondary | ICD-10-CM | POA: Diagnosis not present

## 2021-10-17 DIAGNOSIS — R531 Weakness: Secondary | ICD-10-CM | POA: Diagnosis not present

## 2021-10-17 DIAGNOSIS — G4733 Obstructive sleep apnea (adult) (pediatric): Secondary | ICD-10-CM | POA: Diagnosis present

## 2021-10-17 DIAGNOSIS — M810 Age-related osteoporosis without current pathological fracture: Secondary | ICD-10-CM | POA: Diagnosis present

## 2021-10-17 DIAGNOSIS — N184 Chronic kidney disease, stage 4 (severe): Secondary | ICD-10-CM | POA: Diagnosis present

## 2021-10-17 DIAGNOSIS — K449 Diaphragmatic hernia without obstruction or gangrene: Secondary | ICD-10-CM | POA: Diagnosis not present

## 2021-10-17 DIAGNOSIS — Z8261 Family history of arthritis: Secondary | ICD-10-CM

## 2021-10-17 DIAGNOSIS — Z79899 Other long term (current) drug therapy: Secondary | ICD-10-CM

## 2021-10-17 DIAGNOSIS — Z8249 Family history of ischemic heart disease and other diseases of the circulatory system: Secondary | ICD-10-CM

## 2021-10-17 LAB — COMPREHENSIVE METABOLIC PANEL
ALT: 39 U/L (ref 0–44)
AST: 95 U/L — ABNORMAL HIGH (ref 15–41)
Albumin: 2.6 g/dL — ABNORMAL LOW (ref 3.5–5.0)
Alkaline Phosphatase: 105 U/L (ref 38–126)
Anion gap: 18 — ABNORMAL HIGH (ref 5–15)
BUN: 45 mg/dL — ABNORMAL HIGH (ref 8–23)
CO2: 39 mmol/L — ABNORMAL HIGH (ref 22–32)
Calcium: 8.9 mg/dL (ref 8.9–10.3)
Chloride: 75 mmol/L — ABNORMAL LOW (ref 98–111)
Creatinine, Ser: 2.57 mg/dL — ABNORMAL HIGH (ref 0.44–1.00)
GFR, Estimated: 18 mL/min — ABNORMAL LOW (ref >60)
Glucose, Bld: 174 mg/dL — ABNORMAL HIGH (ref 70–99)
Potassium: 2.2 mmol/L — CL (ref 3.5–5.1)
Sodium: 132 mmol/L — ABNORMAL LOW (ref 135–145)
Total Bilirubin: 1.7 mg/dL — ABNORMAL HIGH (ref 0.3–1.2)
Total Protein: 6.1 g/dL — ABNORMAL LOW (ref 6.5–8.1)

## 2021-10-17 LAB — CBC WITH DIFFERENTIAL/PLATELET
Abs Immature Granulocytes: 0.04 10*3/uL (ref 0.00–0.07)
Basophils Absolute: 0 10*3/uL (ref 0.0–0.1)
Basophils Relative: 0 %
Eosinophils Absolute: 0 10*3/uL (ref 0.0–0.5)
Eosinophils Relative: 0 %
HCT: 31 % — ABNORMAL LOW (ref 36.0–46.0)
Hemoglobin: 10.2 g/dL — ABNORMAL LOW (ref 12.0–15.0)
Immature Granulocytes: 0 %
Lymphocytes Relative: 16 %
Lymphs Abs: 1.7 10*3/uL (ref 0.7–4.0)
MCH: 28.7 pg (ref 26.0–34.0)
MCHC: 32.9 g/dL (ref 30.0–36.0)
MCV: 87.3 fL (ref 80.0–100.0)
Monocytes Absolute: 1.4 10*3/uL — ABNORMAL HIGH (ref 0.1–1.0)
Monocytes Relative: 14 %
Neutro Abs: 7.3 10*3/uL (ref 1.7–7.7)
Neutrophils Relative %: 70 %
Platelets: 191 10*3/uL (ref 150–400)
RBC: 3.55 MIL/uL — ABNORMAL LOW (ref 3.87–5.11)
RDW: 19.1 % — ABNORMAL HIGH (ref 11.5–15.5)
WBC: 10.5 10*3/uL (ref 4.0–10.5)
nRBC: 0 % (ref 0.0–0.2)

## 2021-10-17 LAB — TROPONIN I (HIGH SENSITIVITY)
Troponin I (High Sensitivity): 53 ng/L — ABNORMAL HIGH (ref <18)
Troponin I (High Sensitivity): 52 ng/L — ABNORMAL HIGH (ref <18)

## 2021-10-17 LAB — URINALYSIS, ROUTINE W REFLEX MICROSCOPIC
Bilirubin Urine: NEGATIVE
Glucose, UA: NEGATIVE mg/dL
Ketones, ur: NEGATIVE mg/dL
Leukocytes,Ua: NEGATIVE
Nitrite: NEGATIVE
Protein, ur: 30 mg/dL — AB
Specific Gravity, Urine: 1.005 (ref 1.005–1.030)
pH: 6 (ref 5.0–8.0)

## 2021-10-17 LAB — MAGNESIUM: Magnesium: 2.3 mg/dL (ref 1.7–2.4)

## 2021-10-17 LAB — BRAIN NATRIURETIC PEPTIDE: B Natriuretic Peptide: 296.7 pg/mL — ABNORMAL HIGH (ref 0.0–100.0)

## 2021-10-17 MED ORDER — ROSUVASTATIN CALCIUM 10 MG PO TABS
40.0000 mg | ORAL_TABLET | Freq: Every day | ORAL | Status: DC
  Administered 2021-10-18 – 2021-10-21 (×5): 40 mg via ORAL
  Filled 2021-10-17 (×2): qty 4
  Filled 2021-10-17 (×2): qty 2
  Filled 2021-10-17: qty 4

## 2021-10-17 MED ORDER — POTASSIUM CHLORIDE 10 MEQ/100ML IV SOLN
10.0000 meq | INTRAVENOUS | Status: AC
  Administered 2021-10-18: 10 meq via INTRAVENOUS
  Filled 2021-10-17: qty 100

## 2021-10-17 MED ORDER — POTASSIUM CHLORIDE 10 MEQ/100ML IV SOLN
10.0000 meq | Freq: Once | INTRAVENOUS | Status: AC
  Administered 2021-10-17: 10 meq via INTRAVENOUS
  Filled 2021-10-17: qty 100

## 2021-10-17 MED ORDER — AMIODARONE HCL 200 MG PO TABS
200.0000 mg | ORAL_TABLET | Freq: Every day | ORAL | Status: DC
  Administered 2021-10-18 – 2021-10-21 (×4): 200 mg via ORAL
  Filled 2021-10-17 (×4): qty 1

## 2021-10-17 MED ORDER — ACETAMINOPHEN 650 MG RE SUPP: 650.0000 mg | Freq: Four times a day (QID) | RECTAL | Status: DC | PRN

## 2021-10-17 MED ORDER — POTASSIUM CHLORIDE CRYS ER 20 MEQ PO TBCR
40.0000 meq | EXTENDED_RELEASE_TABLET | Freq: Once | ORAL | Status: AC
Start: 2021-10-17 — End: 2021-10-17
  Administered 2021-10-17: 40 meq via ORAL
  Filled 2021-10-17: qty 2

## 2021-10-17 MED ORDER — ENOXAPARIN SODIUM 30 MG/0.3ML IJ SOSY
30.0000 mg | PREFILLED_SYRINGE | INTRAMUSCULAR | Status: DC
  Administered 2021-10-18 – 2021-10-20 (×4): 30 mg via SUBCUTANEOUS
  Filled 2021-10-17 (×4): qty 0.3

## 2021-10-17 MED ORDER — FUROSEMIDE 10 MG/ML IJ SOLN: 40.0000 mg | Freq: Once | INTRAMUSCULAR | Status: DC

## 2021-10-17 MED ORDER — TORSEMIDE 20 MG PO TABS: 20.0000 mg | ORAL_TABLET | Freq: Every day | ORAL | Status: DC

## 2021-10-17 MED ORDER — POTASSIUM CHLORIDE CRYS ER 20 MEQ PO TBCR
40.0000 meq | EXTENDED_RELEASE_TABLET | Freq: Two times a day (BID) | ORAL | Status: DC
  Administered 2021-10-18 – 2021-10-21 (×7): 40 meq via ORAL
  Filled 2021-10-17 (×7): qty 2

## 2021-10-17 MED ORDER — ONDANSETRON HCL 4 MG/2ML IJ SOLN: 4.0000 mg | Freq: Four times a day (QID) | INTRAMUSCULAR | Status: DC | PRN

## 2021-10-17 MED ORDER — ALBUTEROL SULFATE HFA 108 (90 BASE) MCG/ACT IN AERS: 2.0000 | INHALATION_SPRAY | Freq: Four times a day (QID) | RESPIRATORY_TRACT | Status: DC | PRN

## 2021-10-17 MED ORDER — ONDANSETRON HCL 4 MG PO TABS: 4.0000 mg | ORAL_TABLET | Freq: Four times a day (QID) | ORAL | Status: DC | PRN

## 2021-10-17 MED ORDER — ACETAMINOPHEN 325 MG PO TABS
650.0000 mg | ORAL_TABLET | Freq: Four times a day (QID) | ORAL | Status: DC | PRN
  Administered 2021-10-20 (×2): 650 mg via ORAL
  Filled 2021-10-17 (×2): qty 2

## 2021-10-17 MED ORDER — MOMETASONE FURO-FORMOTEROL FUM 200-5 MCG/ACT IN AERO
2.0000 | INHALATION_SPRAY | Freq: Two times a day (BID) | RESPIRATORY_TRACT | Status: DC
  Administered 2021-10-18 – 2021-10-21 (×7): 2 via RESPIRATORY_TRACT
  Filled 2021-10-17: qty 8.8

## 2021-10-17 NOTE — Assessment & Plan Note (Addendum)
Continue amiodarone Intolerant of anticoagulation due to recurrent GI bleed

## 2021-10-17 NOTE — Assessment & Plan Note (Signed)
Severe and symptomatic for protracted weakness Secondary to diuretic therapy Replete and monitor

## 2021-10-17 NOTE — Assessment & Plan Note (Signed)
Continue CPAP.  

## 2021-10-17 NOTE — Assessment & Plan Note (Addendum)
Followed at the vascular clinic Has lymphedema pumps and is awaiting thigh high Teds

## 2021-10-17 NOTE — Assessment & Plan Note (Addendum)
Persistent, following treatment for anasarca and symptomatic anemia in April 2023 Suspect multifactorial Chest x-ray showing no evidence to support acute CHF or pneumonia at this time Patient was recently evaluated by pulmonologist, Dr. Lanney Gins on 6/13   High-resolution CT chest is scheduled for August 2023 given concern for pulmonary fibrosis as patient is on amiodarone Cardiology has been consulted Consider pulmonology consult CPAP nightly

## 2021-10-17 NOTE — Assessment & Plan Note (Signed)
March 2023: Admitted to [facility] with symptomatic anemia with melena requiring 2 units PRBCs.  Eliquis held Had colonoscopy, EGD, and capsule endoscopy which were unremarkable.  Eliquis resumed April 2023: Hospitalized at [facility] for melena and underwent enteroscopy and capsule endoscopy that showed patchy erythematous and petechial mucosa with erosion in the stomach.  Eliquis was held but then resumed at 2.5 mg twice daily April 2023: Recurrent melena requiring transfusion of 1 units PRBCs.  Eliquis DC'd

## 2021-10-17 NOTE — Assessment & Plan Note (Addendum)
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

## 2021-10-17 NOTE — H&P (Incomplete)
History and Physical    Patient: April [name] WPY:[id number] DOB: 02/18/1941 DOA: 10/17/2021 DOS: the patient was seen and examined on 10/17/2021 PCP: Einar Pheasant, MD  Patient coming from: Home  Chief Complaint:  Chief Complaint  Patient presents with   Shortness of Breath   Weakness    HPI: April [name] is a 81 y.o. female with medical history significant for Paroxysmal A-fib on amiodarone, off Eliquis due to recurrent GI bleed, scheduled for watchman evaluation July 2023,,, HFpEF (EF 60 to 65% 07/2021), CAD s/p PCI to RCA 2017, HTN, OSA on CPAP, lymphedema on lymph pumps and TED hose with supple, CKD 3B, DM, history of GI bleed who presents to the ED with chronic shortness of breath that has been worsening for the past week associated with weakness.  Patient was last seen by her cardiologist on 6/5 with reports of increased lower extremity edema and was advised to take an extra dose of torsemide as needed for worsening shortness of breath.  Over the past several months she has often had the need to uptitrate her diuretic therapy for worsening swelling. She was started on Zaroxolyn just over a week ago.  She denies chest pain. Has a cough the last few days but denies fever or chills. ED course and data review: Mildly tachypneic with RR 21-25 in the ED with otherwise normal vitals. Labs: Troponin 53-52, BNP 296, down from baseline 300s to 400s.  Creatinine 2.57, up from baseline of 1.4 in April 2023 with potassium of 2.2, sodium 132.  Bicarb 39, anion gap 18.  AST elevation to 95 and total bili 1.7.  WBC 10,000 with hemoglobin 10.2 which is around her baseline.  Magnesium normal at 2.3. EKG, personally reviewed and interpreted: Sinus at 67 with nonspecific ST-T wave changes. Chest x-ray: Nonacute.  Patient treated with oral and IV potassium.  Hospitalist consulted for admission.   Review of Systems: As mentioned in the history of present illness. All other systems reviewed and are  negative.  Past Medical History:  Diagnosis Date   Anemia    CAD S/P PCI pRCA Promus DES 3.5 x 16 (4.1 mm), ostRPDA Promus DES 2.5 x 12 (2.7 mm) 03/25/2016   CAD S/P percutaneous coronary angioplasty    a. 07/2015 MV: No ischemia, EF 66%;  b. 03/2016 Inflat STEMI/PCI: LM nl, LAD 25d, RI nl, LCX nl, OM1/2 nl, RCA 80p (3.5x16 Promus Premier DES), 50p/m, 30d, RPDA 90 (2.5x12 Promu Premier DES); c. 01/2018 Cath ([place], [place]): Patent RCA stents->Med Rx.   CHF (congestive heart failure) ([place])    CKD (chronic kidney disease), stage III ([place])    Diabetes mellitus without complication ([place])    Diastolic dysfunction    a. 10/2013 Echo: EF 55-65%, Gr1 DD; b. 01/2018 Echo ([place], [place]): EF 55-60%, Gr2 DD, RVSP 76mHg.   Diverticulitis    Dyspnea    Endometriosis    Family history of adverse reaction to anesthesia    Mother - had to stay in ICU because of breathing difficulties every time she had anesthesia   GERD (gastroesophageal reflux disease)    GI bleed    a. 06/2021 - melena-->2 u PRBCs.   Hiatal hernia    History of colon polyps 08/1994   History of Migraines    Hypercholesterolemia    Hypertension    LBBB (left bundle branch block)    Osteoporosis    PAF (paroxysmal atrial fibrillation) ([place]    a. 03/2016  Dx @ time of MI; b. CHA2DS2VASc = 5-->Eliquis.   Rheumatic fever    Sleep apnea    CPAP   Spasmodic dysphonia    ST elevation myocardial infarction (STEMI) of inferolateral wall, initial episode of care ([employer]) 03/25/2016   ST elevation myocardial infarction (STEMI) of inferolateral wall, initial episode of care [facility]) 03/25/2016   Urine incontinence    Past Surgical History:  Procedure Laterality Date   ABDOMINAL HYSTERECTOMY     ovaries not removed   APPENDECTOMY     was removed during hysterectomy   Breast biopsies     x2   BREAST EXCISIONAL BIOPSY Bilateral "years ago"   neg   BREAST SURGERY Bilateral    BROW LIFT Bilateral 08/15/2019    Procedure: BLEPHAROPLASTY UPPER EYELID; W/EXCESS SKIN BLEPHAROPTOSIS REPAIR; RESECT EX;  Surgeon: Karle Starch, MD;  Location: [facility];  Service: Ophthalmology;  Laterality: Bilateral;  sleep apnea   CARDIAC CATHETERIZATION  2011   moderate 40% RCA disease   CARDIAC CATHETERIZATION  01/2010   Dr. Gollan'@[facility]'$ : Only noted 40% RCA   CARDIAC CATHETERIZATION N/A 03/25/2016   Procedure: Left Heart Cath and Coronary Angiography;  Surgeon: 04/07/2016, MD;  Location: [facility] CV LAB;  Service: Cardiovascular;  Laterality: N/A;   CARDIAC CATHETERIZATION N/A 03/25/2016   Procedure: Coronary Stent Intervention;  Surgeon: 04/07/2016, MD;  Location: [facility] CV LAB;  Service: Cardiovascular;  Laterality: N/A;   COLONOSCOPY  2013   COLONOSCOPY WITH PROPOFOL N/A 07/11/2021   Procedure: COLONOSCOPY WITH PROPOFOL;  Surgeon: 07/24/2021, MD;  Location: [facility] ENDOSCOPY;  Service: Endoscopy;  Laterality: N/A;   ESOPHAGOGASTRODUODENOSCOPY (EGD) WITH PROPOFOL N/A 03/17/2015   Procedure: ESOPHAGOGASTRODUODENOSCOPY (EGD) WITH PROPOFOL;  Surgeon: 03/30/2015, MD;  Location: [facility] ENDOSCOPY;  Service: Gastroenterology;  Laterality: N/A;   ESOPHAGOGASTRODUODENOSCOPY (EGD) WITH PROPOFOL N/A 07/10/2021   Procedure: ESOPHAGOGASTRODUODENOSCOPY (EGD) WITH PROPOFOL;  Surgeon: 16/03/2022, MD;  Location: [facility] ENDOSCOPY;  Service: Gastroenterology;  Laterality: N/A;   GIVENS CAPSULE STUDY N/A 07/11/2021   Procedure: GIVENS CAPSULE STUDY;  Surgeon: 07/24/2021, MD;  Location[facility] ENDOSCOPY;  Service: Endoscopy;  Laterality: N/A;   NM MYOVIEW ([place] [facility])  07/2015   No evidence ischemia or infarction. EF 66%. Low risk   TONSILLECTOMY     TRANSTHORACIC ECHOCARDIOGRAM  10/2013   Normal LV size and function. EF 55-65%. GR 1 DD. Otherwise normal.   Social History:  reports that she has never smoked. She has never used smokeless tobacco. She reports that she does not drink alcohol and does not  use drugs.  Allergies  Allergen Reactions   Penicillins Other (See Comments)    Okay to take amoxicillin/(pt does not recall what the reaction to penicillin was (58-51 years old)    Sulfa Antibiotics Rash    Family History  Problem Relation Age of Onset   Arthritis Mother    Stroke Mother    Hypertension Mother    Osteoporosis Mother    Heart disease Father        MI   Heart attack Father    Stroke Brother    Heart attack Sister    Breast cancer Other        first cousin x 2   Stroke Daughter    Heart attack Daughter    Colon cancer Neg Hx     Prior to Admission medications   Medication Sig Start Date End Date Taking? Authorizing Provider  acetaminophen (TYLENOL) 500  MG tablet Take 500 mg by mouth daily as needed for headache (pain).    [provider]  albuterol (PROVENTIL HFA;VENTOLIN HFA) 108 (90 Base) MCG/ACT inhaler Inhale 2 puffs into the lungs every 6 (six) hours as needed for wheezing or shortness of breath. 04/22/18   Flora Lipps, MD  amiodarone (PACERONE) 200 MG tablet TAKE 1 TABLET BY MOUTH DAILY 09/27/21   Minna Merritts, MD  Azelastine HCl 137 MCG/SPRAY SOLN USE 1 TO 2 SPRAYS IN EACH NOSTRIL TWICE A DAY AS NEEDED FOR DRAINAGE 01/20/20   Einar Pheasant, MD  cholecalciferol (VITAMIN D3) 25 MCG (1000 UNIT) tablet Take 1,000 Units by mouth daily.    [provider]  famotidine (PEPCID) 20 MG tablet Take 20 mg by mouth. Takes 1 to 2 times daily    [provider]  fluticasone-salmeterol (ADVAIR HFA) 230-21 MCG/ACT inhaler Inhale 2 puffs into the lungs 2 (two) times daily. 10/20/20   Flora Lipps, MD  Glucosamine-Chondroit-Collagen [phone] MG CAPS Take 1 tablet by mouth in the morning and at bedtime.    [provider]  hydrALAZINE (APRESOLINE) 10 MG tablet TAKE ONE TABLET BY MOUTH THREE TIMES A DAY 04/15/21   Minna Merritts, MD  mupirocin ointment (BACTROBAN) 2 % Apply 1 application. topically 2 (two) times daily.     [provider]  nitroGLYCERIN (NITROSTAT) 0.4 MG SL tablet Place 1 tablet (0.4 mg total) under the tongue every 5 (five) minutes as needed. 04/22/18   Minna Merritts, MD  Polyethyl Glycol-Propyl Glycol (SYSTANE OP) Place 1 drop into both eyes daily as needed (dry eyes).    [provider]  potassium chloride SA (KLOR-CON M) 20 MEQ tablet Take 2 tablets 2x/day 09/27/21   Einar Pheasant, MD  Propylene Glycol (SYSTANE COMPLETE) 0.6 % SOLN Apply 1 drop to eye daily.    [provider]  rosuvastatin (CRESTOR) 40 MG tablet TAKE 1 TABLET BY MOUTH DAILY 09/08/21   Furth, Cadence H, PA-C  torsemide (DEMADEX) 20 MG tablet Take 2 tablets (40 mg total) by mouth daily. 09/08/21   Furth, Cadence H, PA-C  traMADol (ULTRAM) 50 MG tablet Take by mouth every 6 (six) hours as needed.    [provider]    Physical Exam: Vitals:   10/17/21 1529 10/17/21 1530 10/17/21 1830 10/17/21 1900  BP: (!) 133/47  (!) 137/47 (!) 131/58  Pulse: 70  68 68  Resp: (!) 21  (!) 25 (!) 23  Temp: 98 F (36.7 C)     TempSrc: Oral     SpO2: 95%  97% 93%  Weight:  77.1 kg    Height:  '4\' 11"'$  (1.499 m)     Physical Exam  Labs on Admission: I have personally reviewed following labs and imaging studies  CBC: Recent Labs  Lab 10/13/21 1113 10/17/21 1534  WBC 7.7 10.5  NEUTROABS 3.9 7.3  HGB 9.8* 10.2*  HCT 29.6* 31.0*  MCV 88.2 87.3  PLT 188.0 176   Basic Metabolic Panel: Recent Labs  Lab 10/17/21 1534 10/17/21 1546  NA 132*  --   K 2.2*  --   CL 75*  --   CO2 39*  --   GLUCOSE 174*  --   BUN 45*  --   CREATININE 2.57*  --   CALCIUM 8.9  --   MG  --  2.3   GFR: Estimated Creatinine Clearance: 15.1 mL/min (A) (by C-G formula based on SCr of 2.57 mg/dL (H)). Liver Function  Tests: Recent Labs  Lab 10/13/21 1113 10/17/21 1534  AST 70* 95*  ALT 36* 39  ALKPHOS 125* 105  BILITOT 1.4* 1.7*  PROT 6.2 6.1*  ALBUMIN 2.9* 2.6*   No results for input(s): "LIPASE",  "AMYLASE" in the last 168 hours. No results for input(s): "AMMONIA" in the last 168 hours. Coagulation Profile: No results for input(s): "INR", "PROTIME" in the last 168 hours. Cardiac Enzymes: No results for input(s): "CKTOTAL", "CKMB", "CKMBINDEX", "TROPONINI" in the last 168 hours. BNP (last 3 results) No results for input(s): "PROBNP" in the last 8760 hours. HbA1C: No results for input(s): "HGBA1C" in the last 72 hours. CBG: No results for input(s): "GLUCAP" in the last 168 hours. Lipid Profile: No results for input(s): "CHOL", "HDL", "LDLCALC", "TRIG", "CHOLHDL", "LDLDIRECT" in the last 72 hours. Thyroid Function Tests: No results for input(s): "TSH", "T4TOTAL", "FREET4", "T3FREE", "THYROIDAB" in the last 72 hours. Anemia Panel: No results for input(s): "VITAMINB12", "FOLATE", "FERRITIN", "TIBC", "IRON", "RETICCTPCT" in the last 72 hours. Urine analysis:    Component Value Date/Time   COLORURINE STRAW (A) 07/07/2021 1705   APPEARANCEUR HAZY (A) 07/07/2021 1705   LABSPEC 1.008 07/07/2021 1705   PHURINE 5.0 07/07/2021 1705   GLUCOSEU NEGATIVE 07/07/2021 1705   GLUCOSEU NEGATIVE 12/15/2016 1117   HGBUR SMALL (A) 07/07/2021 1705   BILIRUBINUR NEGATIVE 07/07/2021 1705   KETONESUR NEGATIVE 07/07/2021 1705   PROTEINUR NEGATIVE 07/07/2021 1705   UROBILINOGEN 0.2 12/15/2016 1117   NITRITE NEGATIVE 07/07/2021 1705   LEUKOCYTESUR SMALL (A) 07/07/2021 1705    Radiological Exams on Admission: DG Chest 2 View  Result Date: 10/17/2021 CLINICAL DATA:  Shortness of breath. EXAM: CHEST - 2 VIEW COMPARISON:  August 08, 2021. FINDINGS: The heart size and mediastinal contours are within normal limits. Both lungs are clear. Hiatal hernia is noted. The visualized skeletal structures are unremarkable. IMPRESSION: No active cardiopulmonary disease. Electronically Signed   By: Marijo Conception M.D.   On: 10/17/2021 16:19     Data Reviewed: Relevant notes from primary care and specialist visits,  past discharge summaries as available in EHR, including Care Everywhere. Prior diagnostic testing as pertinent to current admission diagnoses Updated medications and problem lists for reconciliation ED course, including vitals, labs, imaging, treatment and response to treatment Triage notes, nursing and pharmacy notes and ED provider's notes Notable results as noted in HPI   Assessment and Plan: * Acute on Chronic dyspnea Persistent, following treatment for anasarca and symptomatic anemia in April 2023 Suspect multifactorial Chest x-ray showing no evidence to support acute CHF or pneumonia at this time Patient was recently evaluated by pulmonologist, Dr. 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  Suspecting all diuretic related We will get repeat    Chronic heart failure with preserved ejection fraction [facility]) Patient has worsening of lower extremity edema but BNP  improved from baseline and chest x-ray is clear Continue hydralazine We will decrease torsemide to 20 mg daily Cardiology consult for medication optimization Had echo in April 2023 with EF 60 to 65%  CAD S/P PCI pRCA Promus DES 3.5 x 16 (4.1 mm), ostRPDA Promus DES 2.5 x  12 (2.7 mm) No complaints of chest pain.  Troponin elevated but flat at 53-52.  EKG nonacute Continue rosuvastatin and nitroglycerin sublingual as needed chest pain  Paroxysmal atrial fibrillation (HCC) Continue amiodarone Intolerant of anticoagulation due to recurrent GI bleed  OSA on CPAP Continue CPAP  History of recurrent GI bleed on apixaban March 2023: Admitted to [facility] with symptomatic anemia with melena requiring 2 units PRBCs.  Eliquis held Had colonoscopy, EGD, and capsule endoscopy which were unremarkable.  Eliquis resumed April 2023: Hospitalized at [facility] for melena and underwent enteroscopy and capsule endoscopy that showed patchy erythematous and petechial mucosa with erosion in the stomach.  Eliquis was held but then resumed at 2.5 mg twice daily April 2023: Recurrent melena requiring transfusion of 1 units PRBCs.  Eliquis DC'd  Chronic obstructive pulmonary disease, unspecified COPD type ([place]) Not acutely exacerbated.  Continue Advair.  DuoNebs as needed  Lymphedema Followed at the vascular clinic Has lymphedema pumps and is awaiting thigh high Teds        DVT prophylaxis: Lovenox  Consults: Nephrology, Dr. Murlean Iba and cardiology, Dr. Stanford Breed  Advance Care Planning:   Code Status: Prior   Family Communication: none  Disposition Plan: Back to previous home environment  Severity of Illness: The appropriate patient status for this patient is OBSERVATION. Observation status is judged to be reasonable and necessary in order to provide the required intensity of service to ensure the patient's safety. The patient's presenting symptoms, physical exam findings, and initial radiographic and laboratory data in the context of their medical condition is felt to place them at decreased risk for further clinical deterioration. Furthermore, it is anticipated that the patient will be medically stable for discharge from the hospital within 2 midnights of admission.    Author: Athena Masse, MD 10/17/2021 8:30 PM  For on call review [email or web address].

## 2021-10-17 NOTE — Assessment & Plan Note (Signed)
Patient has worsening of lower extremity edema but BNP improved from baseline and chest x-ray is clear Continue hydralazine We will decrease torsemide to 20 mg daily Cardiology consult for medication optimization Had echo in April 2023 with EF 60 to 65%

## 2021-10-17 NOTE — ED Provider Notes (Addendum)
[facility] Provider Note    Event Date/Time   First MD Initiated Contact with Patient 10/17/21 1803     (approximate)   History   Shortness of Breath and Weakness   HPI  April [name] is a 81 y.o. female with history of CHF, diabetes, hypertension, osteoporosis, atrial fibrillation, CKD presents emergency department shortness of breath.  Patient's been short of breath since Friday.  States that her doctor had cut down on her potassium by 1 pill recently.  Patient states her legs are always swollen.  Denies any fever or chills.  Denies chest pain.      Physical Exam   Triage Vital Signs: ED Triage Vitals  Enc Vitals Group     BP 10/17/21 1529 (!) 133/47     Pulse Rate 10/17/21 1529 70     Resp 10/17/21 1529 (!) 21     Temp 10/17/21 1529 98 F (36.7 C)     Temp Source 10/17/21 1529 Oral     SpO2 10/17/21 1529 95 %     Weight 10/17/21 1530 170 lb (77.1 kg)     Height 10/17/21 1530 '4\' 11"'$  (1.499 m)     Head Circumference --      Peak Flow --      Pain Score 10/17/21 1530 0     Pain Loc --      Pain Edu? --      Excl. in [place]? --     Most recent vital signs: Vitals:   10/17/21 1830 10/17/21 1900  BP: (!) 137/47 (!) 131/58  Pulse: 68 68  Resp: (!) 25 (!) 23  Temp:    SpO2: 97% 93%     General: Awake, no distress.   CV:  Good peripheral perfusion. regular rate and  rhythm Resp:  Normal effort. Lungs decreased lung sounds in lower  lung fields Abd:  No distention.   Other:  Swelling noted in both legs bilaterally, large amount of edema, 1+ pitting   ED Results / Procedures / Treatments   Labs (all labs ordered are listed, but only abnormal results are displayed) Labs Reviewed  CBC WITH DIFFERENTIAL/PLATELET - Abnormal; Notable for the following components:      Result Value   RBC 3.55 (*)    Hemoglobin 10.2 (*)    HCT 31.0 (*)    RDW 19.1 (*)    Monocytes Absolute 1.4 (*)    All other components within normal limits   COMPREHENSIVE METABOLIC PANEL - Abnormal; Notable for the following components:   Sodium 132 (*)    Potassium 2.2 (*)    Chloride 75 (*)    CO2 39 (*)    Glucose, Bld 174 (*)    BUN 45 (*)    Creatinine, Ser 2.57 (*)    Total Protein 6.1 (*)    Albumin 2.6 (*)    AST 95 (*)    Total Bilirubin 1.7 (*)    GFR, Estimated 18 (*)    Anion gap 18 (*)    All other components within normal limits  BRAIN NATRIURETIC PEPTIDE - Abnormal; Notable for the following components:   B Natriuretic Peptide 296.7 (*)    All other components within normal limits  TROPONIN I (HIGH SENSITIVITY) - Abnormal; Notable for the following components:   Troponin I (High Sensitivity) 53 (*)    All other components within normal limits  TROPONIN I (HIGH SENSITIVITY) - Abnormal; Notable for the following components:   Troponin  I (High Sensitivity) 52 (*)    All other components within normal limits  MAGNESIUM  URINALYSIS, ROUTINE W REFLEX MICROSCOPIC     EKG  EKG shows first-degree heart block, see physician read   RADIOLOGY Chest x-ray    PROCEDURES:   .Critical Care E&M  Performed by: Versie Starks, PA-C Critical care provider statement:    Critical care time (minutes):  45   Critical care time was exclusive of:  Separately billable procedures and treating other patients   Critical care was necessary to treat or prevent imminent or life-threatening deterioration of the following conditions:  Metabolic crisis and cardiac failure   Critical care was time spent personally by me on the following activities:  Blood draw for specimens, development of treatment plan with patient or surrogate, discussions with primary provider, evaluation of patient's response to treatment, examination of patient, interpretation of cardiac output measurements, obtaining history from patient or surrogate, ordering and performing treatments and interventions, ordering and review of laboratory studies, ordering and review  of radiographic studies, pulse oximetry, re-evaluation of patient's condition and review of old charts After initial E/M assessment, critical care services were subsequently performed that were exclusive of separately billable procedures or treatment.      MEDICATIONS ORDERED IN ED: Medications  potassium chloride SA (KLOR-CON M) CR tablet 40 mEq (40 mEq Oral Given 10/17/21 1846)  potassium chloride 10 mEq in 100 mL IVPB (10 mEq Intravenous New Bag/Given 10/17/21 1845)     IMPRESSION / MDM / ASSESSMENT AND PLAN / ED COURSE  I reviewed the triage vital signs and the nursing notes.                              Differential diagnosis includes, but is not limited to, MI, CHF, hypokalemia, hyperkalemia, anemia, PE, CAP  Patient's presentation is most consistent with acute presentation with potential threat to life or bodily function.   Patient's labs are concerning as her troponin is elevated at 53, CBC appears to be normal, comprehensive metabolic panel has critical value with potassium at 2.2, her anion gap is also elevated, BUN and creatinine are more elevated than normal, CO2 was increased at 39  Due to the hypokalemia we will start the patient on 10 mEq of potassium IV, 40 mEq p.o.  With troponin being elevated will get second troponin to see if this is her normal value, also awaiting BNP, magnesium is normal  Patient's BNP is elevated at 296  Chest x-ray interpreted by me as being negative for acute abnormality  Due to the patient's severely critical level of potassium will admit for hypokalemia and shortness of breath.  Second troponin is still pending.  Consult hospitalist for admission  Second troponin returned at 52 which is similar to the first troponin at 53.  However her last visit had a troponin of 16.  Unsure if she has had a small event or if her heart is working harder due to the fluid.  Consult with Dr. Damita Dunnings hospitalist, she will be admitting for  hypokalemia.  FINAL CLINICAL IMPRESSION(S) / ED DIAGNOSES   Final diagnoses:  Hypokalemia  Shortness of breath     Rx / DC Orders   ED Discharge Orders     None        Note:  This document was prepared using Dragon voice recognition software and may include unintentional dictation errors.    Versie Starks, PA-C 10/17/21  1779    Versie Starks, PA-C 10/17/21 2044    Arta Silence, MD 10/17/21 2253

## 2021-10-17 NOTE — ED Triage Notes (Signed)
Pt presents to ED with c/o of SOB. Pt states "I am always short of breath" Pt states it started to get worse this past Friday. NAD noted.

## 2021-10-17 NOTE — ED Provider Triage Note (Signed)
Emergency Medicine Provider Triage Evaluation Note  TANASHIA CIESLA , a 81 y.o. female  was evaluated in triage.  Pt complains of with history of CHF presents with shortness of breath and difficulty breathing.  States her legs are always swollen and does not appear to be much more than normal.  Review of Systems  Positive: Shortness of breath Negative: Fever  Physical Exam  BP (!) 133/47 (BP Location: Right Arm)   Pulse 70   Temp 98 F (36.7 C) (Oral)   Resp (!) 21   Ht '4\' 11"'$  (1.499 m)   Wt 77.1 kg   LMP 05/01/1972   SpO2 95%   BMI 34.34 kg/m  Gen:   Awake, no distress   Resp:  Slightly increased effort MSK:   Moves extremities without difficulty, edema noted bilaterally Other:    Medical Decision Making  Medically screening exam initiated at 3:33 PM.  Appropriate orders placed.  [name] was informed that the remainder of the evaluation will be completed by another provider, this initial triage assessment does not replace that evaluation, and the importance of remaining in the ED until their evaluation is complete.  CHF protocols initiated   Versie Starks, PA-C 10/17/21 1534

## 2021-10-17 NOTE — ED Notes (Signed)
Pt pulled up and bed and adjusted. Pt given a lunch from ER. Pt's sats staying at 88-90% will apply O2

## 2021-10-17 NOTE — Assessment & Plan Note (Signed)
Not acutely exacerbated.  Continue Advair.  DuoNebs as needed

## 2021-10-17 NOTE — Assessment & Plan Note (Signed)
No complaints of chest pain.  Troponin elevated but flat at 53-52.  EKG nonacute Continue rosuvastatin and nitroglycerin sublingual as needed chest pain

## 2021-10-17 NOTE — Assessment & Plan Note (Signed)
Patient with abnormal sodium of 132, chloride of 75, bicarb of 39 and anion gap of 18.  Suspecting all diuretic related We will get repeat

## 2021-10-18 DIAGNOSIS — J9601 Acute respiratory failure with hypoxia: Secondary | ICD-10-CM | POA: Diagnosis present

## 2021-10-18 DIAGNOSIS — E876 Hypokalemia: Secondary | ICD-10-CM

## 2021-10-18 DIAGNOSIS — I480 Paroxysmal atrial fibrillation: Secondary | ICD-10-CM

## 2021-10-18 DIAGNOSIS — N1832 Chronic kidney disease, stage 3b: Secondary | ICD-10-CM

## 2021-10-18 DIAGNOSIS — E1122 Type 2 diabetes mellitus with diabetic chronic kidney disease: Secondary | ICD-10-CM | POA: Diagnosis present

## 2021-10-18 DIAGNOSIS — E669 Obesity, unspecified: Secondary | ICD-10-CM | POA: Diagnosis present

## 2021-10-18 DIAGNOSIS — T502X5A Adverse effect of carbonic-anhydrase inhibitors, benzothiadiazides and other diuretics, initial encounter: Secondary | ICD-10-CM | POA: Diagnosis present

## 2021-10-18 DIAGNOSIS — Z20822 Contact with and (suspected) exposure to covid-19: Secondary | ICD-10-CM | POA: Diagnosis present

## 2021-10-18 DIAGNOSIS — J449 Chronic obstructive pulmonary disease, unspecified: Secondary | ICD-10-CM | POA: Diagnosis present

## 2021-10-18 DIAGNOSIS — Z9861 Coronary angioplasty status: Secondary | ICD-10-CM | POA: Diagnosis not present

## 2021-10-18 DIAGNOSIS — E871 Hypo-osmolality and hyponatremia: Secondary | ICD-10-CM | POA: Diagnosis present

## 2021-10-18 DIAGNOSIS — I252 Old myocardial infarction: Secondary | ICD-10-CM | POA: Diagnosis not present

## 2021-10-18 DIAGNOSIS — N179 Acute kidney failure, unspecified: Secondary | ICD-10-CM | POA: Diagnosis present

## 2021-10-18 DIAGNOSIS — I5032 Chronic diastolic (congestive) heart failure: Secondary | ICD-10-CM

## 2021-10-18 DIAGNOSIS — I130 Hypertensive heart and chronic kidney disease with heart failure and stage 1 through stage 4 chronic kidney disease, or unspecified chronic kidney disease: Secondary | ICD-10-CM | POA: Diagnosis present

## 2021-10-18 DIAGNOSIS — E874 Mixed disorder of acid-base balance: Secondary | ICD-10-CM | POA: Diagnosis present

## 2021-10-18 DIAGNOSIS — R531 Weakness: Secondary | ICD-10-CM | POA: Diagnosis not present

## 2021-10-18 DIAGNOSIS — I959 Hypotension, unspecified: Secondary | ICD-10-CM | POA: Diagnosis not present

## 2021-10-18 DIAGNOSIS — R0602 Shortness of breath: Secondary | ICD-10-CM | POA: Diagnosis present

## 2021-10-18 DIAGNOSIS — I2510 Atherosclerotic heart disease of native coronary artery without angina pectoris: Secondary | ICD-10-CM | POA: Diagnosis present

## 2021-10-18 DIAGNOSIS — E878 Other disorders of electrolyte and fluid balance, not elsewhere classified: Secondary | ICD-10-CM

## 2021-10-18 DIAGNOSIS — E7800 Pure hypercholesterolemia, unspecified: Secondary | ICD-10-CM | POA: Diagnosis present

## 2021-10-18 DIAGNOSIS — T501X5A Adverse effect of loop [high-ceiling] diuretics, initial encounter: Secondary | ICD-10-CM | POA: Diagnosis present

## 2021-10-18 DIAGNOSIS — N170 Acute kidney failure with tubular necrosis: Secondary | ICD-10-CM

## 2021-10-18 DIAGNOSIS — Z95818 Presence of other cardiac implants and grafts: Secondary | ICD-10-CM | POA: Diagnosis not present

## 2021-10-18 DIAGNOSIS — Z6834 Body mass index (BMI) 34.0-34.9, adult: Secondary | ICD-10-CM | POA: Diagnosis not present

## 2021-10-18 DIAGNOSIS — Z8719 Personal history of other diseases of the digestive system: Secondary | ICD-10-CM | POA: Diagnosis not present

## 2021-10-18 DIAGNOSIS — R0609 Other forms of dyspnea: Secondary | ICD-10-CM | POA: Diagnosis not present

## 2021-10-18 DIAGNOSIS — I890 Lymphedema, not elsewhere classified: Secondary | ICD-10-CM | POA: Diagnosis present

## 2021-10-18 DIAGNOSIS — G4733 Obstructive sleep apnea (adult) (pediatric): Secondary | ICD-10-CM | POA: Diagnosis present

## 2021-10-18 LAB — BLOOD GAS, VENOUS
Acid-Base Excess: 28.1 mmol/L — ABNORMAL HIGH (ref 0.0–2.0)
Bicarbonate: 49.7 mmol/L — ABNORMAL HIGH (ref 20.0–28.0)
O2 Saturation: 100 %
Patient temperature: 37
pCO2, Ven: 35 mmHg — ABNORMAL LOW (ref 44–60)
pH, Ven: >7.74 (ref 7.25–7.43)
pO2, Ven: 199 mmHg — ABNORMAL HIGH (ref 32–45)

## 2021-10-18 LAB — BASIC METABOLIC PANEL
Anion gap: 13 (ref 5–15)
BUN: 49 mg/dL — ABNORMAL HIGH (ref 8–23)
CO2: 41 mmol/L — ABNORMAL HIGH (ref 22–32)
Calcium: 8.5 mg/dL — ABNORMAL LOW (ref 8.9–10.3)
Chloride: 78 mmol/L — ABNORMAL LOW (ref 98–111)
Creatinine, Ser: 2.35 mg/dL — ABNORMAL HIGH (ref 0.44–1.00)
GFR, Estimated: 20 mL/min — ABNORMAL LOW (ref >60)
Glucose, Bld: 192 mg/dL — ABNORMAL HIGH (ref 70–99)
Potassium: <2 mmol/L — CL (ref 3.5–5.1)
Sodium: 132 mmol/L — ABNORMAL LOW (ref 135–145)

## 2021-10-18 LAB — POTASSIUM: Potassium: 2.3 mmol/L — CL (ref 3.5–5.1)

## 2021-10-18 MED ORDER — TORSEMIDE 20 MG PO TABS
20.0000 mg | ORAL_TABLET | Freq: Every day | ORAL | Status: DC
  Administered 2021-10-18: 20 mg via ORAL
  Filled 2021-10-18 (×2): qty 1

## 2021-10-18 MED ORDER — POTASSIUM CHLORIDE CRYS ER 20 MEQ PO TBCR: 40.0000 meq | EXTENDED_RELEASE_TABLET | Freq: Two times a day (BID) | ORAL | Status: DC

## 2021-10-18 MED ORDER — POTASSIUM CHLORIDE CRYS ER 20 MEQ PO TBCR
40.0000 meq | EXTENDED_RELEASE_TABLET | Freq: Once | ORAL | Status: AC
  Administered 2021-10-18: 40 meq via ORAL
  Filled 2021-10-18: qty 2

## 2021-10-18 MED ORDER — POTASSIUM CHLORIDE 10 MEQ/100ML IV SOLN
10.0000 meq | Freq: Once | INTRAVENOUS | Status: AC
  Administered 2021-10-18: 10 meq via INTRAVENOUS
  Filled 2021-10-18: qty 100

## 2021-10-18 MED ORDER — POLYETHYLENE GLYCOL 3350 17 G PO PACK
17.0000 g | PACK | Freq: Every day | ORAL | Status: DC | PRN
  Administered 2021-10-18: 17 g via ORAL
  Filled 2021-10-18: qty 1

## 2021-10-18 MED ORDER — SODIUM CHLORIDE 0.9 % IV BOLUS
500.0000 mL | Freq: Once | INTRAVENOUS | Status: AC
  Administered 2021-10-18: 500 mL via INTRAVENOUS

## 2021-10-18 MED ORDER — POTASSIUM CHLORIDE 10 MEQ/100ML IV SOLN
10.0000 meq | INTRAVENOUS | Status: AC
  Administered 2021-10-18 (×2): 10 meq via INTRAVENOUS
  Filled 2021-10-18 (×2): qty 100

## 2021-10-18 NOTE — Progress Notes (Signed)
Admission profile updated. ?

## 2021-10-18 NOTE — Consult Note (Signed)
Cardiology Consultation:   Patient ID: April [name] MRN: [id number]; DOB: 12-Sep-1940  Admit date: 10/17/2021 Date of Consult: 10/18/2021  PCP:  Einar Pheasant, MD   [facility] Providers Cardiologist:  Ida Rogue, MD        Patient Profile:   April [name] is a 81 y.o. female with a hx of coroanry artery disease, HFpEF, essential hypertension, paroxysmal atrial fibrillation, CKD, DM II who is being seen 10/18/2021 for the evaluation of shortness of breath and weakness at the request of Dr Damita Dunnings.  History of Present Illness:   April [name] was seen at [facility] in 06/2021 with symptomatic anemia with melena. Hgb had dropped to 7.2. She required transfusion of 2 units for PRBC's and Eliquis was placed on hold. EGD was preformed which showed mildly erythematous gastric mucosa without any obvious bleeding. On 07/21/2021 she was seen by GI H and H was stable. She was then admitted in April to [facility] with recurrent melena as well as a small amount of blood streaked reflux leading to her Eliquis being held again. She was then readmitted to [facility] 08/08/2021 to 08/15/2021 with acute on chronic HFpEF, recurrent melena and anemia. She was transfused 2 units and torsemide was started with 40 mg in the AM and 20 mg in the PM. Office follow was 08/29/2021 with weakness and persistent shortness of breath. At that time her torsemide was increased to 40 mg bid. She was refereed to EP for possible watchman procedure with recurrent bleeding and inability to continue to take her anticoagulation. Her last office visit was 10/03/2021 where she remained on torsemide 20 mg bid with extra dosing PRN for increased weight and shortness of breath. She continued to remain off of Eliquis due to bleeding.   She presented to the emergency department for shortness of breath unchanged from her baseline per the patient and swelling at  baseline. She stated that she was having weakness that was generalized and issues with bowel  movements. She was recently started on Zaroxolyn by her nephrologist about a week ago.   Initial vital signs: blood pressure 133/47, pulse 70, rr 21, temp 98  Pertinent labs: RBC 3.55, hgb 10.2, hct 31.0, sodium 132, potassium 2.2, chloride 75, BUN 45, creatinine 2.57, BNP 296.7, Hs troponins  53 and 52  Imaging: no active cardiopulmonary disease   Past Medical History:  Diagnosis Date   Anemia    CAD S/P PCI pRCA Promus DES 3.5 x 16 (4.1 mm), ostRPDA Promus DES 2.5 x 12 (2.7 mm) 03/25/2016   CAD S/P percutaneous coronary angioplasty    a. 07/2015 MV: No ischemia, EF 66%;  b. 03/2016 Inflat STEMI/PCI: LM nl, LAD 25d, RI nl, LCX nl, OM1/2 nl, RCA 80p (3.5x16 Promus Premier DES), 50p/m, 30d, RPDA 90 (2.5x12 Promu Premier DES); c. 01/2018 Cath ([place], [place]): Patent RCA stents->Med Rx.   CHF (congestive heart failure) ([place])    CKD (chronic kidney disease), stage III ([place])    Diabetes mellitus without complication ([place])    Diastolic dysfunction    a. 10/2013 Echo: EF 55-65%, Gr1 DD; b. 01/2018 Echo ([place], [place]): EF 55-60%, Gr2 DD, RVSP 67mHg.   Diverticulitis    Dyspnea    Endometriosis    Family history of adverse reaction to anesthesia    Mother - had to stay in ICU because of breathing difficulties every time she had anesthesia   GERD (gastroesophageal reflux disease)    GI bleed  a. 06/2021 - melena-->2 u PRBCs.   Hiatal hernia    History of colon polyps 08/1994   History of Migraines    Hypercholesterolemia    Hypertension    LBBB (left bundle branch block)    Osteoporosis    PAF (paroxysmal atrial fibrillation) ([place])    a. 03/2016 Dx @ time of MI; b. CHA2DS2VASc = 5-->Eliquis.   Rheumatic fever    Sleep apnea    CPAP   Spasmodic dysphonia    ST elevation myocardial infarction (STEMI) of inferolateral wall, initial episode of care ([place]) 03/25/2016   ST elevation myocardial infarction (STEMI) of inferolateral wall, initial episode of care  [facility]) 03/25/2016   Urine incontinence     Past Surgical History:  Procedure Laterality Date   ABDOMINAL HYSTERECTOMY     ovaries not removed   APPENDECTOMY     was removed during hysterectomy   Breast biopsies     x2   BREAST EXCISIONAL BIOPSY Bilateral "years ago"   neg   BREAST SURGERY Bilateral    BROW LIFT Bilateral 08/15/2019   Procedure: BLEPHAROPLASTY UPPER EYELID; W/EXCESS SKIN BLEPHAROPTOSIS REPAIR; RESECT EX;  Surgeon: Karle Starch, MD;  Location: [facility];  Service: Ophthalmology;  Laterality: Bilateral;  sleep apnea   CARDIAC CATHETERIZATION  2011   moderate 40% RCA disease   CARDIAC CATHETERIZATION  01/2010   Dr. Gollan'@[facility]'$ : Only noted 40% RCA   CARDIAC CATHETERIZATION N/A 03/25/2016   Procedure: Left Heart Cath and Coronary Angiography;  Surgeon: 04/07/2016, MD;  Location: [place] CV LAB;  Service: Cardiovascular;  Laterality: N/A;   CARDIAC CATHETERIZATION N/A 03/25/2016   Procedure: Coronary Stent Intervention;  Surgeon: 04/07/2016, MD;  Location: [place] CV LAB;  Service: Cardiovascular;  Laterality: N/A;   COLONOSCOPY  2013   COLONOSCOPY WITH PROPOFOL N/A 07/11/2021   Procedure: COLONOSCOPY WITH PROPOFOL;  Surgeon: 07/24/2021, MD;  Location: [facility] ENDOSCOPY;  Service: Endoscopy;  Laterality: N/A;   ESOPHAGOGASTRODUODENOSCOPY (EGD) WITH PROPOFOL N/A 03/17/2015   Procedure: ESOPHAGOGASTRODUODENOSCOPY (EGD) WITH PROPOFOL;  Surgeon: 03/30/2015, MD;  Location: [facility] ENDOSCOPY;  Service: Gastroenterology;  Laterality: N/A;   ESOPHAGOGASTRODUODENOSCOPY (EGD) WITH PROPOFOL N/A 07/10/2021   Procedure: ESOPHAGOGASTRODUODENOSCOPY (EGD) WITH PROPOFOL;  Surgeon: 16/03/2022, MD;  Location: [facility] ENDOSCOPY;  Service: Gastroenterology;  Laterality: N/A;   GIVENS CAPSULE STUDY N/A 07/11/2021   Procedure: GIVENS CAPSULE STUDY;  Surgeon: 07/24/2021, MD;  Location: [facility] ENDOSCOPY;  Service: Endoscopy;  Laterality: N/A;   NM MYOVIEW ([place]  HX)  07/2015   No evidence ischemia or infarction. EF 66%. Low risk   TONSILLECTOMY     TRANSTHORACIC ECHOCARDIOGRAM  10/2013   Normal LV size and function. EF 55-65%. GR 1 DD. Otherwise normal.     Home Medications:  Prior to Admission medications   Medication Sig Start Date End Date Taking? Authorizing Provider  acetaminophen (TYLENOL) 500 MG tablet Take 500 mg by mouth daily as needed for headache (pain).    [provider]  albuterol (PROVENTIL HFA;VENTOLIN HFA) 108 (90 Base) MCG/ACT inhaler Inhale 2 puffs into the lungs every 6 (six) hours as needed for wheezing or shortness of breath. 04/22/18   05/05/18, MD  amiodarone (PACERONE) 200 MG tablet TAKE 1 TABLET BY MOUTH DAILY 09/27/21   10/10/21, MD  Azelastine HCl 137 MCG/SPRAY SOLN USE 1 TO 2 SPRAYS IN EACH NOSTRIL TWICE A DAY AS NEEDED FOR DRAINAGE 01/20/20   02/02/20, MD  cholecalciferol (VITAMIN D3) 25 MCG (1000 UNIT) tablet Take 1,000 Units by mouth daily.    [provider]  famotidine (PEPCID) 20 MG tablet Take 20 mg by mouth. Takes 1 to 2 times daily    [provider]  fluticasone-salmeterol (ADVAIR HFA) 230-21 MCG/ACT inhaler Inhale 2 puffs into the lungs 2 (two) times daily. 10/20/20   Flora Lipps, MD  Glucosamine-Chondroit-Collagen [phone] MG CAPS Take 1 tablet by mouth in the morning and at bedtime.    [provider]  hydrALAZINE (APRESOLINE) 10 MG tablet TAKE ONE TABLET BY MOUTH THREE TIMES A DAY 04/15/21   Minna Merritts, MD  mupirocin ointment (BACTROBAN) 2 % Apply 1 application. topically 2 (two) times daily.    [provider]  nitroGLYCERIN (NITROSTAT) 0.4 MG SL tablet Place 1 tablet (0.4 mg total) under the tongue every 5 (five) minutes as needed. 04/22/18   Minna Merritts, MD  Polyethyl Glycol-Propyl Glycol (SYSTANE OP) Place 1 drop into both eyes daily as needed (dry eyes).    [provider]  potassium chloride SA (KLOR-CON M) 20 MEQ  tablet Take 2 tablets 2x/day 09/27/21   Einar Pheasant, MD  Propylene Glycol (SYSTANE COMPLETE) 0.6 % SOLN Apply 1 drop to eye daily.    [provider]  rosuvastatin (CRESTOR) 40 MG tablet TAKE 1 TABLET BY MOUTH DAILY 09/08/21   Furth, Cadence H, PA-C  torsemide (DEMADEX) 20 MG tablet Take 2 tablets (40 mg total) by mouth daily. 09/08/21   Furth, Cadence H, PA-C  traMADol (ULTRAM) 50 MG tablet Take by mouth every 6 (six) hours as needed.    [provider]    Inpatient Medications: Scheduled Meds:  amiodarone  200 mg Oral Daily   enoxaparin (LOVENOX) injection  30 mg Subcutaneous Q24H   mometasone-formoterol  2 puff Inhalation BID   potassium chloride SA  40 mEq Oral BID   rosuvastatin  40 mg Oral Daily   torsemide  20 mg Oral Daily   Continuous Infusions:  potassium chloride     PRN Meds: acetaminophen **OR** acetaminophen, albuterol, ondansetron **OR** ondansetron (ZOFRAN) IV  Allergies:    Allergies  Allergen Reactions   Penicillins Other (See Comments)    Okay to take amoxicillin/(pt does not recall what the reaction to penicillin was (14-47 years old)    Sulfa Antibiotics Rash    Social History:   Social History   Socioeconomic History   Marital status: Widowed    Spouse name: Not on file   Number of children: 2   Years of education: Not on file   Highest education level: Not on file  Occupational History   Not on file  Tobacco Use   Smoking status: Never   Smokeless tobacco: Never  Vaping Use   Vaping Use: Never used  Substance and Sexual Activity   Alcohol use: No    Alcohol/week: 0.0 standard drinks of alcohol   Drug use: No   Sexual activity: Not Currently    Birth control/protection: Post-menopausal  Other Topics Concern   Not on file  Social History Narrative   Not on file   Social Determinants of Health   Financial Resource Strain: Not on file  Food Insecurity: Not on file  Transportation Needs: Not on file  Physical  Activity: Not on file  Stress: Not on file  Social Connections: Not on file  Intimate Partner Violence: Not on file    Family History:    Family History  Problem Relation  Age of Onset   Arthritis Mother    Stroke Mother    Hypertension Mother    Osteoporosis Mother    Heart disease Father        MI   Heart attack Father    Stroke Brother    Heart attack Sister    Breast cancer Other        first cousin x 2   Stroke Daughter    Heart attack Daughter    Colon cancer Neg Hx      ROS:  Please see the history of present illness.  Review of Systems  Constitutional:  Positive for malaise/fatigue.  HENT: Negative.    Eyes: Negative.   Respiratory:  Positive for shortness of breath.   Cardiovascular:  Positive for leg swelling.  Gastrointestinal:  Positive for constipation.  Genitourinary: Negative.   Musculoskeletal: Negative.   Skin: Negative.   Neurological:  Positive for weakness.  Endo/Heme/Allergies: Negative.   Psychiatric/Behavioral: Negative.      All other ROS reviewed and negative.     Physical Exam/Data:   Vitals:   10/18/21 0500 10/18/21 0530 10/18/21 0630 10/18/21 0700  BP: (!) 86/44 (!) 98/42 (!) 106/44 (!) 101/45  Pulse: 63 65 65 62  Resp: '13 17 16 17  '$ Temp:      TempSrc:      SpO2: 96% 93% 94% 95%  Weight:      Height:       No intake or output data in the 24 hours ending 10/18/21 0737    10/17/2021    3:30 PM 10/03/2021    1:36 PM 09/21/2021   12:40 PM  Last 3 Weights  Weight (lbs) 170 lb 183 lb 6 oz 180 lb 3.2 oz  Weight (kg) 77.111 kg 83.178 kg 81.738 kg     Body mass index is 34.34 kg/m.  General:  Well nourished, well developed, in no acute distress HEENT: normal Neck: no JVD Vascular: No carotid bruits; Distal pulses 2+ bilaterally Cardiac:  normal S1, S2; irregular irregularly; no murmur  Lungs:  clear to auscultation bilaterally, no wheezing, rhonchi or rales, respirations are unlabored on 2L O2 via [place]  Abd: soft, nontender, no  hepatomegaly, obese, bowel sounds present in all 4 quadrants  Ext: 2+ edema to bilateral lower extremities, positive toe swelling for lymphoedema Musculoskeletal:  No deformities, BUE and BLE strength normal and equal Skin: warm and dry  Neuro:  CNs 2-12 intact, no focal abnormalities noted Psych:  Normal affect   EKG:  The EKG was personally reviewed and demonstrates:  SR rate of 67, first degree AVB, IVCD, and right axis deviation  Telemetry:  Telemetry was personally reviewed and demonstrates:  SR rate 60-70's, unifocal PVC's and artifact  Relevant CV Studies: Echocardiogram completed 08/10/2021 1. Left ventricular ejection fraction, by estimation, is 60 to 65%. The  left ventricle has normal function. The left ventricle has no regional  wall motion abnormalities. Left ventricular diastolic parameters are  consistent with Grade II diastolic  dysfunction (pseudonormalization).   2. Right ventricular systolic function is normal. The right ventricular  size is mildly enlarged. There is mildly elevated pulmonary artery  systolic pressure. The estimated right ventricular systolic pressure is  17.7 mmHg.   3. Left atrial size was mildly dilated.   4. Right atrial size was mildly dilated.   5. The mitral valve is normal in structure. Mild mitral valve  regurgitation.   6. Tricuspid valve regurgitation is moderate.   7. The aortic  valve is grossly normal. Aortic valve regurgitation is not  visualized.   8. The inferior vena cava is normal in size with greater than 50%  respiratory variability, suggesting right atrial pressure of 3 mmHg.   Laboratory Data:  High Sensitivity Troponin:   Recent Labs  Lab 10/17/21 1534 10/17/21 1849  TROPONINIHS 53* 52*     Chemistry Recent Labs  Lab 10/17/21 1534 10/17/21 1546 10/17/21 2317 10/18/21 0610  NA 132*  --  132*  --   K 2.2*  --  <2.0* 2.3*  CL 75*  --  78*  --   CO2 39*  --  41*  --   GLUCOSE 174*  --  192*  --   BUN 45*  --   49*  --   CREATININE 2.57*  --  2.35*  --   CALCIUM 8.9  --  8.5*  --   MG  --  2.3  --   --   GFRNONAA 18*  --  20*  --   ANIONGAP 18*  --  13  --     Recent Labs  Lab 10/13/21 1113 10/17/21 1534  PROT 6.2 6.1*  ALBUMIN 2.9* 2.6*  AST 70* 95*  ALT 36* 39  ALKPHOS 125* 105  BILITOT 1.4* 1.7*   Lipids No results for input(s): "CHOL", "TRIG", "HDL", "LABVLDL", "LDLCALC", "CHOLHDL" in the last 168 hours.  Hematology Recent Labs  Lab 10/13/21 1113 10/17/21 1534  WBC 7.7 10.5  RBC 3.36* 3.55*  HGB 9.8* 10.2*  HCT 29.6* 31.0*  MCV 88.2 87.3  MCH  --  28.7  MCHC 33.1 32.9  RDW 19.3* 19.1*  PLT 188.0 191   Thyroid No results for input(s): "TSH", "FREET4" in the last 168 hours.  BNP Recent Labs  Lab 10/17/21 1534  BNP 296.7*    DDimer No results for input(s): "DDIMER" in the last 168 hours.   Radiology/Studies:  DG Chest 2 View  Result Date: 10/17/2021 CLINICAL DATA:  Shortness of breath. EXAM: CHEST - 2 VIEW COMPARISON:  August 08, 2021. FINDINGS: The heart size and mediastinal contours are within normal limits. Both lungs are clear. Hiatal hernia is noted. The visualized skeletal structures are unremarkable. IMPRESSION: No active cardiopulmonary disease. Electronically Signed   By: Marijo Conception M.D.   On: 10/17/2021 16:19     Assessment and Plan:   Severe hypokalemia likely secondary to over dieresis  -potassium 2.3 -currently receiving IV potassium  -continue with replacing potassium till level of 4 -daily BMP -monitor/trend/replete as needed  Shortness of breath       HFpEF EF 60-65%       Elevated BNP -BNP 296.7 -Patient at baseline -last echocardiogram completed on 08/10/2021 - daily weight -strict I&O -low sodium diet -continue to titrate oxygen therapy to keep O2 sats greater than equal to 90%  3.   Coronary artery disease -stable -denies chest pain -last LHC completed 03/2016 -no invasive procedures scheduled at this time   4.   Paroxysmal  atrial fibrillation -current SR rate 60-70's -continue amiodarone -continue cardiac monitor -currently not on anticoagulation due to recurrent bleeding -Refer to EP for possible Watchman procedure  5.   Acute on chronic CKD likely secondary to over dieresis and hypotension -creatinine 2.35 today -creatinine on admission 2.57 -baseline 2.0 -avoid nephrotoxic medications  -if increased creatine consider consulting nephrology as patient is followed by them as outpatient   6. Essential hypertension -currently hypotensive -home medication hydralazine on hold -vital signs  per un it protocol   Risk Assessment/Risk Scores:        New York Heart Association (NYHA) Functional Class NYHA Class II  CHA2DS2-VASc Score =     This indicates a  % annual risk of stroke. The patient's score is based upon:        For questions or updates, please contact [place] Please consult [email or web address] for contact info under    Signed, Dossie Ocanas, NP  10/18/2021 7:37 AM

## 2021-10-18 NOTE — Progress Notes (Addendum)
[facility] PROGRESS NOTE    Progress Note  April [name]  QMG:[id number] DOB: Jul 11, 1940 DOA: 10/17/2021 PCP: Einar Pheasant, MD     Brief Narrative:   April [name] is an 81 y.o. female past medical history significant for paroxysmal atrial fibrillation on amiodarone off Eliquis due to recurrent GI bleed, scheduled for Watchman procedure in July 2023, heart failure with preserved ejection fraction EF 60%, CAD status post PCI to the RCA in 2017, lymphedema, chronic kidney disease stage IIIb comes into the ED for evaluation of shortness of breath which has worsened over the past week prior to admission, now with associated weakness.  Was recently seen by cardiology on 10/03/2021 for increased lower extremity edema his torsemide was increased and was started on metolazone by her nephrologist.  In the ED was found to have cardiac biomarkers flat, BNP of 300 from her baseline of 300-400, creatinine of 2.5 (with a baseline of around 1.4 on April 2023 close parentheses potassium of 2.2 anion gap of 18 mild elevation in her AST and bilirubin white count of 10 hemoglobin of 10   Assessment/Plan:   Acute on Chronic dyspnea: Current greater 93% on room air, afebrile. With a creatinine of 2.5 on admission, hypochloremia and metabolic acidosis.  BMP at 300. UA specific gravity of 1005. Chest x-ray was unremarkable. Hold diuretic therapy gentle short IV fluid hydration to improve electrolytes and acute kidney injury.   Also have to replete and correct electrolytes. Consulted cardiology will probably need a right heart cath. Has a follow-up appointment with pulmonary on 10/11/2021, scheduled for high-resolution CT angio for concerns for pulmonary fibrosis while on amiodarone  Severe hypokalemia: Likely due to diuretic therapy continue to replete orally.  Acute renal failure superimposed on stage 3b chronic kidney disease ([place]) Likely due to overdiuresis due to torsemide and  metolazone. Possibly cardiorenal syndrome with a baseline creatinine of 1.4, on admission 2.5. We will hold diuretic therapy. Bolus of normal saline recheck basic metabolic panel in the morning.  Also liberalize her diet. Basic in the morning continue strict I's and O's.  Hyponatremia/hypokalemia/metabolic alkalosis: Likely due to diuretic therapy. We will hold diuretic therapy, repeat electrolytes give her a bolus of normal saline recheck basic metabolic panel in the morning.  High anion gap metabolic acidosis: Likely due to renal disease and hypovolemia, lactic acid unremarkable. We will fluid resuscitate her recheck basic metabolic panel in the morning.  Chronic diastolic heart failure: Chest x-ray is clear. Cardiology has been consulted.  CAD S/P PCI pRCA Promus DES 3.5 x 16 (4.1 mm), ostRPDA Promus DES 2.5 x 12 (2.7  mm) Continue aspirin and statins.  Paroxysmal atrial fibrillation (HCC) Continue amiodarone rate control.  OSA on CPAP CPAP at night.  DVT prophylaxis: heparin Family Communication:none Status is: Observation The patient will require care spanning > 2 midnights and should be moved to inpatient because: Probably need right heart cath and has new acute kidney injury    Code Status:     Code Status Orders  (From admission, onward)           Start     Ordered   10/17/21 2103  Full code  Continuous        10/17/21 2103           Code Status History     Date Active Date Inactive Code Status Order ID Comments User Context   08/08/2021 2235 08/16/2021 1441 Full Code [id number]  Kayleen Memos, DO ED  07/07/2021 2040 07/12/2021 1754 Full Code [id number]  Vianne Bulls, MD ED   03/25/2016 2203 03/29/2016 1733 Full Code [id number]  Leonie Man, MD Inpatient         IV Access:   Peripheral IV   Procedures and diagnostic studies:   DG Chest 2 View  Result Date: 10/17/2021 CLINICAL DATA:  Shortness of breath. EXAM: CHEST - 2 VIEW  COMPARISON:  August 08, 2021. FINDINGS: The heart size and mediastinal contours are within normal limits. Both lungs are clear. Hiatal hernia is noted. The visualized skeletal structures are unremarkable. IMPRESSION: No active cardiopulmonary disease. Electronically Signed   By: Marijo Conception M.D.   On: 10/17/2021 16:19     Medical Consultants:   None.   Subjective:    April [name] relates her breathing is about the same.  Objective:    Vitals:   10/18/21 0300 10/18/21 0400 10/18/21 0430 10/18/21 0500  BP: (!) 110/50 (!) 108/41 (!) 104/57 (!) 86/44  Pulse: 63 67 66 63  Resp: 16 (!) '22 20 13  '$ Temp:      TempSrc:      SpO2: 97% 96% 95% 96%  Weight:      Height:       SpO2: 96 %  No intake or output data in the 24 hours ending 10/18/21 0646 Filed Weights   10/17/21 1530  Weight: 77.1 kg    Exam: General exam: In no acute distress. Respiratory system: Good air movement and clear to auscultation. Cardiovascular system: S1 & S2 heard, RRR. No JVD. Gastrointestinal system: Abdomen is nondistended, soft and nontender.  Extremities: Lymphedema changes Skin: No rashes, lesions or ulcers Psychiatry: Judgement and insight appear normal. Mood & affect appropriate.    Data Reviewed:    Labs: Basic Metabolic Panel: Recent Labs  Lab 10/17/21 1534 10/17/21 1546 10/17/21 2317 10/18/21 0610  NA 132*  --  132*  --   K 2.2*  --  <2.0* 2.3*  CL 75*  --  78*  --   CO2 39*  --  41*  --   GLUCOSE 174*  --  192*  --   BUN 45*  --  49*  --   CREATININE 2.57*  --  2.35*  --   CALCIUM 8.9  --  8.5*  --   MG  --  2.3  --   --    GFR Estimated Creatinine Clearance: 16.5 mL/min (A) (by C-G formula based on SCr of 2.35 mg/dL (H)). Liver Function Tests: Recent Labs  Lab 10/13/21 1113 10/17/21 1534  AST 70* 95*  ALT 36* 39  ALKPHOS 125* 105  BILITOT 1.4* 1.7*  PROT 6.2 6.1*  ALBUMIN 2.9* 2.6*   No results for input(s): "LIPASE", "AMYLASE" in the last 168 hours. No  results for input(s): "AMMONIA" in the last 168 hours. Coagulation profile No results for input(s): "INR", "PROTIME" in the last 168 hours. COVID-19 Labs  No results for input(s): "DDIMER", "FERRITIN", "LDH", "CRP" in the last 72 hours.  Lab Results  Component Value Date   SARSCOV2NAA NEGATIVE 07/07/2021   [place] NEGATIVE 08/13/2019    CBC: Recent Labs  Lab 10/13/21 1113 10/17/21 1534  WBC 7.7 10.5  NEUTROABS 3.9 7.3  HGB 9.8* 10.2*  HCT 29.6* 31.0*  MCV 88.2 87.3  PLT 188.0 191   Cardiac Enzymes: No results for input(s): "CKTOTAL", "CKMB", "CKMBINDEX", "TROPONINI" in the last 168 hours. BNP (last 3 results) No results for input(s): "PROBNP" in the  last 8760 hours. CBG: No results for input(s): "GLUCAP" in the last 168 hours. D-Dimer: No results for input(s): "DDIMER" in the last 72 hours. Hgb A1c: No results for input(s): "HGBA1C" in the last 72 hours. Lipid Profile: No results for input(s): "CHOL", "HDL", "LDLCALC", "TRIG", "CHOLHDL", "LDLDIRECT" in the last 72 hours. Thyroid function studies: No results for input(s): "TSH", "T4TOTAL", "T3FREE", "THYROIDAB" in the last 72 hours.  Invalid input(s): "FREET3" Anemia work up: No results for input(s): "VITAMINB12", "FOLATE", "FERRITIN", "TIBC", "IRON", "RETICCTPCT" in the last 72 hours. Sepsis Labs: Recent Labs  Lab 10/13/21 1113 10/17/21 1534  WBC 7.7 10.5   Microbiology No results found for this or any previous visit (from the past 240 hour(s)).   Medications:    amiodarone  200 mg Oral Daily   enoxaparin (LOVENOX) injection  30 mg Subcutaneous Q24H   mometasone-formoterol  2 puff Inhalation BID   potassium chloride SA  40 mEq Oral BID   rosuvastatin  40 mg Oral Daily   torsemide  20 mg Oral Daily   Continuous Infusions:    LOS: 0 days   April [name]  [facility]  10/18/2021, 6:46 AM

## 2021-10-18 NOTE — Progress Notes (Signed)
[facility]  ROUNDING NOTE   Subjective:   April [name] is a 81 year old female with past medical history hypertension, sleep apnea with CPAP, paroxysmal atrial fibrillation on amiodarone.  Heart failure with preserved EF, diabetes, lymphedema, chronic kidney disease stage IIIb.  Patient presents to the emergency department with complaints of progressive shortness of breath and weakness.  Patient has been admitted for Hypokalemia [E87.6] Acute respiratory failure with hypoxia ([place]) [J96.01]  Patient is known to our practice and is followed outpatient by Dr. Candiss Norse.  Patient was last seen in office on 6/12//23 for routine follow-up.  Due to significant lower extremity edema, patient was placed on torsemide and metolazone for diuresis at a previous appointment and instructed to use lymphedema pumps.  Patient now presents with weakness and shortness of breath.  Labs indicate severely decreased potassium level, 2.2.  Other pertinent labs include sodium 132, serum bicarb 41, glucose 192, BUN 49, creatinine 2.35 with GFR 20.  Chest x-ray negative for acute changes.  Potassium supplementation and IV fluids initiated in the emergency department.   Objective:  Vital signs in last 24 hours:  Temp:  [98 F (36.7 C)] 98 F (36.7 C) (06/19 1529) Pulse Rate:  [60-81] 64 (06/20 1130) Resp:  [13-25] 17 (06/20 1130) BP: (86-137)/(37-97) 119/49 (06/20 1130) SpO2:  [92 %-100 %] 99 % (06/20 1130) Weight:  [77.1 kg] 77.1 kg (06/19 1530)  Weight change:  Filed Weights   10/17/21 1530  Weight: 77.1 kg    Intake/Output: No intake/output data recorded.   Intake/Output this shift:  Total I/O In: -  Out: 850 [Urine:850]  Physical Exam: General: NAD, resting comfortably  Head: Normocephalic, atraumatic.  Dry oral mucosa  Eyes: Anicteric  Lungs:  Clear to auscultation, normal effort  Heart: Regular rate and rhythm  Abdomen:  Soft, nontender, nondistended  Extremities: 1+ peripheral  edema.  Neurologic: Nonfocal, moving all four extremities  Skin: No lesions  Access: None    Basic Metabolic Panel: Recent Labs  Lab 10/17/21 1534 10/17/21 1546 10/17/21 2317 10/18/21 0610  NA 132*  --  132*  --   K 2.2*  --  <2.0* 2.3*  CL 75*  --  78*  --   CO2 39*  --  41*  --   GLUCOSE 174*  --  192*  --   BUN 45*  --  49*  --   CREATININE 2.57*  --  2.35*  --   CALCIUM 8.9  --  8.5*  --   MG  --  2.3  --   --     Liver Function Tests: Recent Labs  Lab 10/13/21 1113 10/17/21 1534  AST 70* 95*  ALT 36* 39  ALKPHOS 125* 105  BILITOT 1.4* 1.7*  PROT 6.2 6.1*  ALBUMIN 2.9* 2.6*   No results for input(s): "LIPASE", "AMYLASE" in the last 168 hours. No results for input(s): "AMMONIA" in the last 168 hours.  CBC: Recent Labs  Lab 10/13/21 1113 10/17/21 1534  WBC 7.7 10.5  NEUTROABS 3.9 7.3  HGB 9.8* 10.2*  HCT 29.6* 31.0*  MCV 88.2 87.3  PLT 188.0 191    Cardiac Enzymes: No results for input(s): "CKTOTAL", "CKMB", "CKMBINDEX", "TROPONINI" in the last 168 hours.  BNP: Invalid input(s): "POCBNP"  CBG: No results for input(s): "GLUCAP" in the last 168 hours.  Microbiology: Results for orders placed or performed during the hospital encounter of 07/07/21  Resp Panel by RT-PCR (Flu A&B, Covid) Nasopharyngeal Swab  Status: None   Collection Time: 07/07/21  8:01 PM   Specimen: Nasopharyngeal Swab; Nasopharyngeal(NP) swabs in vial transport medium  Result Value Ref Range Status   SARS Coronavirus 2 by RT PCR NEGATIVE NEGATIVE Final    Comment: (NOTE) SARS-CoV-2 target nucleic acids are NOT DETECTED.  The SARS-CoV-2 RNA is generally detectable in upper respiratory specimens during the acute phase of infection. The lowest concentration of SARS-CoV-2 viral copies this assay can detect is 138 copies/mL. A negative result does not preclude SARS-Cov-2 infection and should not be used as the sole basis for treatment or other patient management decisions. A  negative result may occur with  improper specimen collection/handling, submission of specimen other than nasopharyngeal swab, presence of viral mutation(s) within the areas targeted by this assay, and inadequate number of viral copies(<138 copies/mL). A negative result must be combined with clinical observations, patient history, and epidemiological information. The expected result is Negative.  Fact Sheet for Patients:  EntrepreneurPulse.com.au  Fact Sheet for Healthcare Providers:  IncredibleEmployment.be  This test is no t yet approved or cleared by the [place] FDA and  has been authorized for detection and/or diagnosis of SARS-CoV-2 by FDA under an Emergency Use Authorization (EUA). This EUA will remain  in effect (meaning this test can be used) for the duration of the COVID-19 declaration under Section 564(b)(1) of the Act, 21 U.S.C.section 360bbb-3(b)(1), unless the authorization is terminated  or revoked sooner.       Influenza A by PCR NEGATIVE NEGATIVE Final   Influenza B by PCR NEGATIVE NEGATIVE Final    Comment: (NOTE) The Xpert Xpress SARS-CoV-2/FLU/RSV plus assay is intended as an aid in the diagnosis of influenza from Nasopharyngeal swab specimens and should not be used as a sole basis for treatment. Nasal washings and aspirates are unacceptable for Xpert Xpress SARS-CoV-2/FLU/RSV testing.  Fact Sheet for Patients: EntrepreneurPulse.com.au  Fact Sheet for Healthcare Providers: IncredibleEmployment.be  This test is not yet approved or cleared by the [place] FDA and has been authorized for detection and/or diagnosis of SARS-CoV-2 by FDA under an Emergency Use Authorization (EUA). This EUA will remain in effect (meaning this test can be used) for the duration of the COVID-19 declaration under Section 564(b)(1) of the Act, 21 U.S.C. section 360bbb-3(b)(1), unless the authorization  is terminated or revoked.  Performed at [facility], [address] [place], [place] [address]     Coagulation Studies: No results for input(s): "LABPROT", "INR" in the last 72 hours.  Urinalysis: Recent Labs    10/17/21 2030  COLORURINE STRAW*  LABSPEC 1.005  PHURINE 6.0  GLUCOSEU NEGATIVE  HGBUR MODERATE*  BILIRUBINUR NEGATIVE  KETONESUR NEGATIVE  PROTEINUR 30*  NITRITE NEGATIVE  LEUKOCYTESUR NEGATIVE      Imaging: DG Chest 2 View  Result Date: 10/17/2021 CLINICAL DATA:  Shortness of breath. EXAM: CHEST - 2 VIEW COMPARISON:  August 08, 2021. FINDINGS: The heart size and mediastinal contours are within normal limits. Both lungs are clear. Hiatal hernia is noted. The visualized skeletal structures are unremarkable. IMPRESSION: No active cardiopulmonary disease. Electronically Signed   By: Marijo Conception M.D.   On: 10/17/2021 16:19     Medications:     amiodarone  200 mg Oral Daily   enoxaparin (LOVENOX) injection  30 mg Subcutaneous Q24H   mometasone-formoterol  2 puff Inhalation BID   potassium chloride SA  40 mEq Oral BID   rosuvastatin  40 mg Oral Daily   acetaminophen **OR** acetaminophen, albuterol, ondansetron **  OR** ondansetron (ZOFRAN) IV  Assessment/ Plan:  Ms. EMMAMAE [name] is a 81 y.o.  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  Heart failure with preserved EF, diabetes, lymphedema, chronic kidney disease stage IIIb.  Patient presents to the emergency department with complaints of progressive shortness of breath and weakness.  Patient has been admitted for Hypokalemia [E87.6] Acute respiratory failure with hypoxia ([facility]) [J96.01]   Acute Kidney Injury on chronic kidney disease stage 3B with baseline creatinine 1.75 and GFR of 29 on 08/24/2021.  Acute kidney injury secondary to overdiuresis Chronic kidney disease is secondary to hypertension, advanced age, diabetes No  IV contrast exposure.  Further diuresis held.  Agree with gentle hydration.  Lower extremity edema greatly improved from office visit.  We will continue to monitor renal function and patient presentation.  Continue to avoid nephrotoxic agents and therapies.  Lab Results  Component Value Date   CREATININE 2.35 (H) 10/17/2021   CREATININE 2.57 (H) 10/17/2021   CREATININE 2.31 (H) 10/06/2021    Intake/Output Summary (Last 24 hours) at 10/18/2021 1429 Last data filed at 10/18/2021 0926 Gross per 24 hour  Intake --  Output 850 ml  Net -850 ml   2.  Hypokalemia/hyponatremia.  Likely due to overdiuresis and kidney injury.  Continue IV and oral supplementation and IV fluids.  3. 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 Most recent hemoglobin A1c is 6.3 on 09/21/2021.   4.  Hypertension with chronic kidney disease.  Home regimen includes hydralazine and torsemide.Torsemide currently held in setting of kidney injury.  Blood pressure decreased for this patient.  Outpatient blood pressure noted at 137/59.    LOS: 0 April [name] 6/20/20232:29 PM

## 2021-10-19 ENCOUNTER — Encounter: Payer: Self-pay | Admitting: Internal Medicine

## 2021-10-19 DIAGNOSIS — E876 Hypokalemia: Secondary | ICD-10-CM | POA: Diagnosis not present

## 2021-10-19 DIAGNOSIS — I5032 Chronic diastolic (congestive) heart failure: Secondary | ICD-10-CM | POA: Diagnosis not present

## 2021-10-19 DIAGNOSIS — R0609 Other forms of dyspnea: Secondary | ICD-10-CM | POA: Diagnosis not present

## 2021-10-19 DIAGNOSIS — N179 Acute kidney failure, unspecified: Secondary | ICD-10-CM | POA: Diagnosis not present

## 2021-10-19 LAB — BASIC METABOLIC PANEL
Anion gap: 13 (ref 5–15)
BUN: 44 mg/dL — ABNORMAL HIGH (ref 8–23)
CO2: 38 mmol/L — ABNORMAL HIGH (ref 22–32)
Calcium: 8.4 mg/dL — ABNORMAL LOW (ref 8.9–10.3)
Chloride: 86 mmol/L — ABNORMAL LOW (ref 98–111)
Creatinine, Ser: 2.24 mg/dL — ABNORMAL HIGH (ref 0.44–1.00)
GFR, Estimated: 21 mL/min — ABNORMAL LOW (ref >60)
Glucose, Bld: 114 mg/dL — ABNORMAL HIGH (ref 70–99)
Potassium: 2.9 mmol/L — ABNORMAL LOW (ref 3.5–5.1)
Sodium: 137 mmol/L (ref 135–145)

## 2021-10-19 LAB — MAGNESIUM: Magnesium: 2.3 mg/dL (ref 1.7–2.4)

## 2021-10-19 LAB — PHOSPHORUS: Phosphorus: 3.2 mg/dL (ref 2.5–4.6)

## 2021-10-19 NOTE — Progress Notes (Signed)
[facility]  ROUNDING NOTE   Subjective:   April [name] is a 81 year old female with past medical history hypertension, sleep apnea with CPAP, paroxysmal atrial fibrillation on amiodarone.  Heart failure with preserved EF, diabetes, lymphedema, chronic kidney disease stage IIIb.  Patient presents to the emergency department with complaints of progressive shortness of breath and weakness.  Patient has been admitted for Shortness of breath [R06.02] Hypokalemia [E87.6] Acute respiratory failure with hypoxia ([place]) [J96.01]  Patient is known to our practice and is followed outpatient by Dr. Candiss Norse.  Patient was last seen in office on 6/12//23 for routine follow-up.    Patient seen sitting up in bed Alert and oriented States she feels well today, smiling Tolerating meals without nausea and vomiting Remains on 2 L nasal cannula but denies shortness of breath Lower extremity edema continues to improve  Objective:  Vital signs in last 24 hours:  Temp:  [98.1 F (36.7 C)-98.6 F (37 C)] 98.6 F (37 C) (06/21 0738) Pulse Rate:  [54-67] 66 (06/21 0738) Resp:  [14-20] 18 (06/21 0738) BP: (103-121)/(42-47) 104/46 (06/21 0738) SpO2:  [93 %-100 %] 96 % (06/21 0738)  Weight change:  Filed Weights   10/17/21 1530  Weight: 77.1 kg    Intake/Output: I/O last 3 completed shifts: In: -  Out: 1550 [Urine:1550]   Intake/Output this shift:  Total I/O In: 240 [P.O.:240] Out: -   Physical Exam: General: NAD, resting comfortably  Head: Normocephalic, atraumatic.  Dry oral mucosa  Eyes: Anicteric  Lungs:  Clear to auscultation, normal effort  Heart: Regular rate and rhythm  Abdomen:  Soft, nontender, nondistended  Extremities: 1+ peripheral edema.  Neurologic: Nonfocal, moving all four extremities  Skin: No lesions  Access: None    Basic Metabolic Panel: Recent Labs  Lab 10/17/21 1534 10/17/21 1546 10/17/21 2317 10/18/21 0610 10/19/21 0609  NA 132*  --  132*  --   137  K 2.2*  --  <2.0* 2.3* 2.9*  CL 75*  --  78*  --  86*  CO2 39*  --  41*  --  38*  GLUCOSE 174*  --  192*  --  114*  BUN 45*  --  49*  --  44*  CREATININE 2.57*  --  2.35*  --  2.24*  CALCIUM 8.9  --  8.5*  --  8.4*  MG  --  2.3  --   --  2.3  PHOS  --   --   --   --  3.2     Liver Function Tests: Recent Labs  Lab 10/13/21 1113 10/17/21 1534  AST 70* 95*  ALT 36* 39  ALKPHOS 125* 105  BILITOT 1.4* 1.7*  PROT 6.2 6.1*  ALBUMIN 2.9* 2.6*    No results for input(s): "LIPASE", "AMYLASE" in the last 168 hours. No results for input(s): "AMMONIA" in the last 168 hours.  CBC: Recent Labs  Lab 10/13/21 1113 10/17/21 1534  WBC 7.7 10.5  NEUTROABS 3.9 7.3  HGB 9.8* 10.2*  HCT 29.6* 31.0*  MCV 88.2 87.3  PLT 188.0 191     Cardiac Enzymes: No results for input(s): "CKTOTAL", "CKMB", "CKMBINDEX", "TROPONINI" in the last 168 hours.  BNP: Invalid input(s): "POCBNP"  CBG: No results for input(s): "GLUCAP" in the last 168 hours.  Microbiology: Results for orders placed or performed during the hospital encounter of 07/07/21  Resp Panel by RT-PCR (Flu A&B, Covid) Nasopharyngeal Swab     Status: None   Collection  Time: 07/07/21  8:01 PM   Specimen: Nasopharyngeal Swab; Nasopharyngeal(NP) swabs in vial transport medium  Result Value Ref Range Status   SARS Coronavirus 2 by RT PCR NEGATIVE NEGATIVE Final    Comment: (NOTE) SARS-CoV-2 target nucleic acids are NOT DETECTED.  The SARS-CoV-2 RNA is generally detectable in upper respiratory specimens during the acute phase of infection. The lowest concentration of SARS-CoV-2 viral copies this assay can detect is 138 copies/mL. A negative result does not preclude SARS-Cov-2 infection and should not be used as the sole basis for treatment or other patient management decisions. A negative result may occur with  improper specimen collection/handling, submission of specimen other than nasopharyngeal swab, presence of viral  mutation(s) within the areas targeted by this assay, and inadequate number of viral copies(<138 copies/mL). A negative result must be combined with clinical observations, patient history, and epidemiological information. The expected result is Negative.  Fact Sheet for Patients:  EntrepreneurPulse.com.au  Fact Sheet for Healthcare Providers:  IncredibleEmployment.be  This test is no t yet approved or cleared by the [place] FDA and  has been authorized for detection and/or diagnosis of SARS-CoV-2 by FDA under an Emergency Use Authorization (EUA). This EUA will remain  in effect (meaning this test can be used) for the duration of the COVID-19 declaration under Section 564(b)(1) of the Act, 21 U.S.C.section 360bbb-3(b)(1), unless the authorization is terminated  or revoked sooner.       Influenza A by PCR NEGATIVE NEGATIVE Final   Influenza B by PCR NEGATIVE NEGATIVE Final    Comment: (NOTE) The Xpert Xpress SARS-CoV-2/FLU/RSV plus assay is intended as an aid in the diagnosis of influenza from Nasopharyngeal swab specimens and should not be used as a sole basis for treatment. Nasal washings and aspirates are unacceptable for Xpert Xpress SARS-CoV-2/FLU/RSV testing.  Fact Sheet for Patients: EntrepreneurPulse.com.au  Fact Sheet for Healthcare Providers: IncredibleEmployment.be  This test is not yet approved or cleared by the [place] FDA and has been authorized for detection and/or diagnosis of SARS-CoV-2 by FDA under an Emergency Use Authorization (EUA). This EUA will remain in effect (meaning this test can be used) for the duration of the COVID-19 declaration under Section 564(b)(1) of the Act, 21 U.S.C. section 360bbb-3(b)(1), unless the authorization is terminated or revoked.  Performed at [facility], [address] [place], [place] [address]     Coagulation Studies: No  results for input(s): "LABPROT", "INR" in the last 72 hours.  Urinalysis: Recent Labs    10/17/21 2030  COLORURINE STRAW*  LABSPEC 1.005  PHURINE 6.0  GLUCOSEU NEGATIVE  HGBUR MODERATE*  BILIRUBINUR NEGATIVE  KETONESUR NEGATIVE  PROTEINUR 30*  NITRITE NEGATIVE  LEUKOCYTESUR NEGATIVE       Imaging: DG Chest 2 View  Result Date: 10/17/2021 CLINICAL DATA:  Shortness of breath. EXAM: CHEST - 2 VIEW COMPARISON:  August 08, 2021. FINDINGS: The heart size and mediastinal contours are within normal limits. Both lungs are clear. Hiatal hernia is noted. The visualized skeletal structures are unremarkable. IMPRESSION: No active cardiopulmonary disease. Electronically Signed   By: Marijo Conception M.D.   On: 10/17/2021 16:19     Medications:     amiodarone  200 mg Oral Daily   enoxaparin (LOVENOX) injection  30 mg Subcutaneous Q24H   mometasone-formoterol  2 puff Inhalation BID   potassium chloride SA  40 mEq Oral BID   rosuvastatin  40 mg Oral Daily   acetaminophen **OR** acetaminophen, albuterol, ondansetron **OR** ondansetron (ZOFRAN) IV,  polyethylene glycol  Assessment/ Plan:  Ms. KAWEHI HOSTETTER is a 81 y.o.  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  Heart failure with preserved EF, diabetes, lymphedema, chronic kidney disease stage IIIb.  Patient presents to the emergency department with complaints of progressive shortness of breath and weakness.  Patient has been admitted for Shortness of breath [R06.02] Hypokalemia [E87.6] Acute respiratory failure with hypoxia (HCC) [J96.01]   Acute Kidney Injury on chronic kidney disease stage 3B with baseline creatinine 1.75 and GFR of 29 on 08/24/2021.  Acute kidney injury secondary to overdiuresis Chronic kidney disease is secondary to hypertension, advanced age, diabetes No IV contrast exposure.  Further diuresis held.   Patient tolerating oral intake, IV fluids  stopped.  Creatinine slightly improved today.  Urine output 1.55 L recorded in previous 24 hours.  Continue to avoid nephrotoxic agents and therapies.  Lab Results  Component Value Date   CREATININE 2.24 (H) 10/19/2021   CREATININE 2.35 (H) 10/17/2021   CREATININE 2.57 (H) 10/17/2021    Intake/Output Summary (Last 24 hours) at 10/19/2021 1254 Last data filed at 10/19/2021 0900 Gross per 24 hour  Intake 240 ml  Output 700 ml  Net -460 ml    2.  Hypokalemia/hyponatremia.  Likely due to overdiuresis and kidney injury.  Sodium corrected to 137.  Potassium improved to 2.9 with IV and oral supplementation.  Continue supplementation until achieved acceptable range.  3. 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 Most recent hemoglobin A1c is 6.3 on 09/21/2021.   4.  Hypertension with chronic kidney disease.  Home regimen includes hydralazine and torsemide.Torsemide currently held in setting of kidney injury.  Outpatient blood pressure noted at 137/59.  Blood pressure remains soft for this patient, 104/46.  We will continue to monitor.  No antihypertensives prescribed at the moment.   LOS: 1 [place] 6/21/202312:54 PM

## 2021-10-19 NOTE — Progress Notes (Signed)
Progress Note  Patient Name: April [name] Date of Encounter: 10/19/2021  [facility] Cardiologist: Ida Rogue, MD   Subjective   Patient seen this morning on rounds. Resting comfortably while watching tv.  She states she is feeling better today but is still a little weak.  Inpatient Medications    Scheduled Meds:  amiodarone  200 mg Oral Daily   enoxaparin (LOVENOX) injection  30 mg Subcutaneous Q24H   mometasone-formoterol  2 puff Inhalation BID   potassium chloride SA  40 mEq Oral BID   rosuvastatin  40 mg Oral Daily   Continuous Infusions:  PRN Meds: acetaminophen **OR** acetaminophen, albuterol, ondansetron **OR** ondansetron (ZOFRAN) IV, polyethylene glycol   Vital Signs    Vitals:   10/18/21 2356 10/19/21 0408 10/19/21 0700 10/19/21 0738  BP: (!) 111/45 (!) 107/47  (!) 104/46  Pulse: 63 63  66  Resp: '20 20 14 18  '$ Temp: 98.5 F (36.9 C) 98.6 F (37 C)  98.6 F (37 C)  TempSrc:    Oral  SpO2: 100% 98%  96%  Weight:      Height:        Intake/Output Summary (Last 24 hours) at 10/19/2021 0845 Last data filed at 10/19/2021 0449 Gross per 24 hour  Intake --  Output 1550 ml  Net -1550 ml      10/17/2021    3:30 PM 10/03/2021    1:36 PM 09/21/2021   12:40 PM  Last 3 Weights  Weight (lbs) 170 lb 183 lb 6 oz 180 lb 3.2 oz  Weight (kg) 77.111 kg 83.178 kg 81.738 kg      Telemetry    NSR with rate in the 60's - Personally Reviewed  ECG    No new tracings - Personally Reviewed  Physical Exam   GEN: No acute distress.   Neck: No JVD Cardiac: RRR, no murmurs, rubs, or gallops.  Respiratory: Clear to auscultation bilaterally. No increased work of breath, on 2L O2 via [place] GI: Soft, nontender, non-distended  MS: +1 edema in b/l lower extremities; No deformity. Neuro:  Nonfocal  Psych: Normal affect   Labs    High Sensitivity Troponin:   Recent Labs  Lab 10/17/21 1534 10/17/21 1849  TROPONINIHS 53* 52*     Chemistry Recent Labs  Lab  10/13/21 1113 10/17/21 1534 10/17/21 1534 10/17/21 1546 10/17/21 2317 10/18/21 0610 10/19/21 0609  NA  --  132*  --   --  132*  --  137  K  --  2.2*   < >  --  <2.0* 2.3* 2.9*  CL  --  75*  --   --  78*  --  86*  CO2  --  39*  --   --  41*  --  38*  GLUCOSE  --  174*  --   --  192*  --  114*  BUN  --  45*  --   --  49*  --  44*  CREATININE  --  2.57*  --   --  2.35*  --  2.24*  CALCIUM  --  8.9  --   --  8.5*  --  8.4*  MG  --   --   --  2.3  --   --   --   PROT 6.2 6.1*  --   --   --   --   --   ALBUMIN 2.9* 2.6*  --   --   --   --   --  AST 70* 95*  --   --   --   --   --   ALT 36* 39  --   --   --   --   --   ALKPHOS 125* 105  --   --   --   --   --   BILITOT 1.4* 1.7*  --   --   --   --   --   GFRNONAA  --  18*  --   --  20*  --  21*  ANIONGAP  --  18*  --   --  13  --  13   < > = values in this interval not displayed.    Lipids No results for input(s): "CHOL", "TRIG", "HDL", "LABVLDL", "LDLCALC", "CHOLHDL" in the last 168 hours.  Hematology Recent Labs  Lab 10/13/21 1113 10/17/21 1534  WBC 7.7 10.5  RBC 3.36* 3.55*  HGB 9.8* 10.2*  HCT 29.6* 31.0*  MCV 88.2 87.3  MCH  --  28.7  MCHC 33.1 32.9  RDW 19.3* 19.1*  PLT 188.0 191   Thyroid No results for input(s): "TSH", "FREET4" in the last 168 hours.  BNP Recent Labs  Lab 10/17/21 1534  BNP 296.7*    DDimer No results for input(s): "DDIMER" in the last 168 hours.   Radiology    DG Chest 2 View  Result Date: 10/17/2021 CLINICAL DATA:  Shortness of breath. EXAM: CHEST - 2 VIEW COMPARISON:  August 08, 2021. FINDINGS: The heart size and mediastinal contours are within normal limits. Both lungs are clear. Hiatal hernia is noted. The visualized skeletal structures are unremarkable. IMPRESSION: No active cardiopulmonary disease. Electronically Signed   By: Marijo Conception M.D.   On: 10/17/2021 16:19    Cardiac Studies   Echocardiogram completed 08/10/2021 1. Left ventricular ejection fraction, by estimation, is  60 to 65%. The  left ventricle has normal function. The left ventricle has no regional  wall motion abnormalities. Left ventricular diastolic parameters are  consistent with Grade II diastolic  dysfunction (pseudonormalization).   2. Right ventricular systolic function is normal. The right ventricular  size is mildly enlarged. There is mildly elevated pulmonary artery  systolic pressure. The estimated right ventricular systolic pressure is  57.2 mmHg.   3. Left atrial size was mildly dilated.   4. Right atrial size was mildly dilated.   5. The mitral valve is normal in structure. Mild mitral valve  regurgitation.   6. Tricuspid valve regurgitation is moderate.   7. The aortic valve is grossly normal. Aortic valve regurgitation is not  visualized.   8. The inferior vena cava is normal in size with greater than 50%  respiratory variability, suggesting right atrial pressure of 3 mmHg.  Patient Profile     81 y.o. female with a history of coronary artery disease, HFpEF, essential hypertension, paroxysmal atrial fibrillation without anticoagulants due to recurrent bleeds.  CKD, DM type II, who has been seen for the evaluation of shortness of breath and weakness.  Assessment & Plan    Severe hypokalemia likely secondary to over dieresis  -potassium 2.9 this morning  -continue with replacing potassium till level of 4 -Continue Potassium Chloride tablet 30mq BID -daily BMP -monitor/trend/replete as needed -Resume torsemide before discharge but avoid metolazone   2.   Shortness of breath, resolving       HFpEF EF 60-65%       Elevated BNP -BNP 296.7 -Patient at baseline -last echocardiogram completed on  08/10/2021 -daily weight -strict I&O -Minus 1.5 L since admission -low sodium diet -continue to titrate oxygen therapy to keep O2 sats greater than equal to 90%   3.   Coronary artery disease -stable -denies chest pain -last LHC completed 03/2016 -no invasive procedures  scheduled at this time    4.   Paroxysmal atrial fibrillation -current NSR rate 60s -continue amiodarone -continue cardiac monitor -currently not on anticoagulation due to recurrent GI bleeding -Refer to EP for possible Watchman procedure   5.   Acute on chronic CKD likely secondary to over dieresis and hypotension -creatinine 2.24 today, improving -creatinine on admission 2.57 -baseline 2.0 -avoid nephrotoxic medications  -Nephrology is currently following patient   6. Essential hypertension -Systolic BPs low [id number] -Continue to hold home Hydralazine -vital signs per unit protocol   7. Bilateral Lower Extremity Edema - Improving - Likely Lymphedema is a component - Consider lymphedema pump     For questions or updates, please contact [facility] Please consult [email or web address] for contact info under        Signed, Sakura Denis, NP  10/19/2021, 8:45 AM

## 2021-10-19 NOTE — Progress Notes (Signed)
Progress Note    April [name]  EGB:[id number] DOB: 31-Oct-1940  DOA: 10/17/2021 PCP: Einar Pheasant, MD      Brief Narrative:    Medical records reviewed and are as summarized below:  April [name] is a 81 y.o. female with past medical history significant for paroxysmal atrial fibrillation on amiodarone off Eliquis due to recurrent GI bleed, scheduled for Watchman procedure in July 2023, heart failure with preserved ejection fraction EF 60%, CAD status post PCI to the RCA in 2017, lymphedema, chronic kidney disease stage IIIb who presented to the ED for evaluation of shortness of breath which has worsened over the past week prior to admission, now with associated weakness.  She was recently seen by cardiology on 10/03/2021 for increased lower extremity edema, and her torsemide was increased and she was started on metolazone by her nephrologist.        Assessment/Plan:   Principal Problem:   Acute on Chronic dyspnea Active Problems:   Acute renal failure superimposed on stage 3b chronic kidney disease (HCC)   Hypokalemia   Chronic heart failure with preserved ejection fraction (HCC)   Electrolyte abnormality   CAD S/P PCI pRCA Promus DES 3.5 x 16 (4.1 mm), ostRPDA Promus DES 2.5 x 12 (2.7 mm)   Paroxysmal atrial fibrillation (HCC)   OSA on CPAP   Lymphedema   Chronic obstructive pulmonary disease, unspecified COPD type ([place])   History of recurrent GI bleed on apixaban   History of adverse reaction to anticoagulant medication   Acute respiratory failure with hypoxia (HCC)   Body mass index is 34.34 kg/m.  (Obesity)   Severe hypokalemia with metabolic alkalosis: Slowly improving.  Continue to replete potassium and monitor levels.  Chronic diastolic CHF, acute on chronic dyspnea: Diuretics on hold  AKI on CKD stage IIIb: This is likely from overdiuresis.  Baseline creatinine likely around 1.5-1.75.  Monitor BMP.  Paroxysmal atrial fibrillation, CAD s/p PCI  proximal RCA and OST right posterior descending artery: Continue aspirin, amiodarone and Lipitor.  She is not on anticoagulation because of recurrent GI bleeding  Hyponatremia: Improving but she has fluctuating sodium levels.  Monitor BMP.  General weakness: Consult PT  OSA: Use CPAP at night  Diet Order             Diet Heart Room service appropriate? Yes; Fluid consistency: Thin  Diet effective now                            Consultants: Cardiologist, nephrologist  Procedures: None    Medications:    amiodarone  200 mg Oral Daily   enoxaparin (LOVENOX) injection  30 mg Subcutaneous Q24H   mometasone-formoterol  2 puff Inhalation BID   potassium chloride SA  40 mEq Oral BID   rosuvastatin  40 mg Oral Daily   Continuous Infusions:   Anti-infectives (From admission, onward)    None              Family Communication/Anticipated D/C date and plan/Code Status   DVT prophylaxis: enoxaparin (LOVENOX) injection 30 mg Start: 10/17/21 2200     Code Status: Full Code  Family Communication: Plan discussed with [name], son, at the bedside Disposition Plan: Plan to discharge home in 2 to 3 days   Status is: Inpatient Remains inpatient appropriate because: Severe hypokalemia, general weakness       Subjective:   Interval events noted.  She complains of general  weakness.  No shortness of breath or chest pain.  Objective:    Vitals:   10/19/21 0408 10/19/21 0700 10/19/21 0738 10/19/21 1532  BP: (!) 107/47  (!) 104/46 (!) 113/47  Pulse: 63  66 65  Resp: '20 14 18 20  '$ Temp: 98.6 F (37 C)  98.6 F (37 C) 98.4 F (36.9 C)  TempSrc:   Oral Oral  SpO2: 98%  96% 98%  Weight:      Height:       No data found.   Intake/Output Summary (Last 24 hours) at 10/19/2021 1634 Last data filed at 10/19/2021 0900 Gross per 24 hour  Intake 240 ml  Output 700 ml  Net -460 ml   Filed Weights   10/17/21 1530  Weight: 77.1 kg    Exam:  GEN:  NAD SKIN: No rash EYES: EOMI ENT: MMM CV: RRR PULM: CTA B ABD: soft, obese, NT, +BS CNS: AAO x 3, non focal EXT: Mild bilateral leg edema, no tenderness        Data Reviewed:   I have personally reviewed following labs and imaging studies:  Labs: Labs show the following:   Basic Metabolic Panel: Recent Labs  Lab 10/17/21 1534 10/17/21 1546 10/17/21 2317 10/18/21 0610 10/19/21 0609  NA 132*  --  132*  --  137  K 2.2*  --  <2.0* 2.3* 2.9*  CL 75*  --  78*  --  86*  CO2 39*  --  41*  --  38*  GLUCOSE 174*  --  192*  --  114*  BUN 45*  --  49*  --  44*  CREATININE 2.57*  --  2.35*  --  2.24*  CALCIUM 8.9  --  8.5*  --  8.4*  MG  --  2.3  --   --  2.3  PHOS  --   --   --   --  3.2   GFR Estimated Creatinine Clearance: 17.4 mL/min (A) (by C-G formula based on SCr of 2.24 mg/dL (H)). Liver Function Tests: Recent Labs  Lab 10/13/21 1113 10/17/21 1534  AST 70* 95*  ALT 36* 39  ALKPHOS 125* 105  BILITOT 1.4* 1.7*  PROT 6.2 6.1*  ALBUMIN 2.9* 2.6*   No results for input(s): "LIPASE", "AMYLASE" in the last 168 hours. No results for input(s): "AMMONIA" in the last 168 hours. Coagulation profile No results for input(s): "INR", "PROTIME" in the last 168 hours.  CBC: Recent Labs  Lab 10/13/21 1113 10/17/21 1534  WBC 7.7 10.5  NEUTROABS 3.9 7.3  HGB 9.8* 10.2*  HCT 29.6* 31.0*  MCV 88.2 87.3  PLT 188.0 191   Cardiac Enzymes: No results for input(s): "CKTOTAL", "CKMB", "CKMBINDEX", "TROPONINI" in the last 168 hours. BNP (last 3 results) No results for input(s): "PROBNP" in the last 8760 hours. CBG: No results for input(s): "GLUCAP" in the last 168 hours. D-Dimer: No results for input(s): "DDIMER" in the last 72 hours. Hgb A1c: No results for input(s): "HGBA1C" in the last 72 hours. Lipid Profile: No results for input(s): "CHOL", "HDL", "LDLCALC", "TRIG", "CHOLHDL", "LDLDIRECT" in the last 72 hours. Thyroid function studies: No results for input(s):  "TSH", "T4TOTAL", "T3FREE", "THYROIDAB" in the last 72 hours.  Invalid input(s): "FREET3" Anemia work up: No results for input(s): "VITAMINB12", "FOLATE", "FERRITIN", "TIBC", "IRON", "RETICCTPCT" in the last 72 hours. Sepsis Labs: Recent Labs  Lab 10/13/21 1113 10/17/21 1534  WBC 7.7 10.5    Microbiology No results found for this or  any previous visit (from the past 240 hour(s)).  Procedures and diagnostic studies:  No results found.             LOS: 1 day   April [name]  [facility]   Pager on [email or web address]. If 7PM-7AM, please contact night-coverage at [email or web address]     10/19/2021, 4:34 PM

## 2021-10-20 ENCOUNTER — Inpatient Hospital Stay: Payer: Medicare Other

## 2021-10-20 DIAGNOSIS — R0602 Shortness of breath: Secondary | ICD-10-CM

## 2021-10-20 DIAGNOSIS — Z9861 Coronary angioplasty status: Secondary | ICD-10-CM

## 2021-10-20 DIAGNOSIS — J449 Chronic obstructive pulmonary disease, unspecified: Secondary | ICD-10-CM

## 2021-10-20 DIAGNOSIS — R0609 Other forms of dyspnea: Secondary | ICD-10-CM | POA: Diagnosis not present

## 2021-10-20 DIAGNOSIS — I2510 Atherosclerotic heart disease of native coronary artery without angina pectoris: Secondary | ICD-10-CM

## 2021-10-20 LAB — BASIC METABOLIC PANEL
Anion gap: 11 (ref 5–15)
BUN: 46 mg/dL — ABNORMAL HIGH (ref 8–23)
CO2: 34 mmol/L — ABNORMAL HIGH (ref 22–32)
Calcium: 8.3 mg/dL — ABNORMAL LOW (ref 8.9–10.3)
Chloride: 86 mmol/L — ABNORMAL LOW (ref 98–111)
Creatinine, Ser: 2.28 mg/dL — ABNORMAL HIGH (ref 0.44–1.00)
GFR, Estimated: 21 mL/min — ABNORMAL LOW (ref >60)
Glucose, Bld: 142 mg/dL — ABNORMAL HIGH (ref 70–99)
Potassium: 3.7 mmol/L (ref 3.5–5.1)
Sodium: 131 mmol/L — ABNORMAL LOW (ref 135–145)

## 2021-10-20 MED ORDER — TORSEMIDE 20 MG PO TABS
40.0000 mg | ORAL_TABLET | Freq: Every day | ORAL | Status: DC
  Administered 2021-10-20 – 2021-10-21 (×2): 40 mg via ORAL
  Filled 2021-10-20 (×2): qty 2

## 2021-10-20 NOTE — Evaluation (Signed)
Physical Therapy Evaluation Patient Details Name: April [name] MRN: [id number] DOB: 27-Dec-1940 Today's Date: 10/20/2021  History of Present Illness  Pt is an 81 yo female that was admitted for shortness of breath, weakness, and hypokalemia. PMH of HTN, sleep apnea, afib, DM, lymphedema, CKD.   Clinical Impression  Patient alert, agreeable to PT and reported 8/10 LBP (chronic) and RN notified at end of session. She reported recently she will use varying DME, primarily [facility] for community ambulation, [place] can provide meals in home if needed, and that her family is checking on her regularly/providing IADLs as needed.  The patient was able to perform supine to sit with extended time, bed rails, supervision. Good sitting balance noted. Sit <> stand twice during session cued for hand placement with RW but completed second rep with supervision. She ambulated ~6f with RW and CGA-supervision, educated on rest breaks as needed. SpO2 90-95% on room air throughout session.  Overall the patient demonstrated deficits (see "PT Problem List") that impede the patient's functional abilities, safety, and mobility and would benefit from skilled PT intervention. Recommendation at this time is HHPT with intermittent supervision/assistance to maximize function, safety, and independence. Per pt, she was recently receiving HHPT.        Recommendations for follow up therapy are one component of a multi-disciplinary discharge planning process, led by the attending physician.  Recommendations may be updated based on patient status, additional functional criteria and insurance authorization.  Follow Up Recommendations Home health PT      Assistance Recommended at Discharge Intermittent Supervision/Assistance  Patient can return home with the following  Assistance with cooking/housework;Assist for transportation;Help with stairs or ramp for entrance    Equipment Recommendations None recommended by PT   Recommendations for Other Services       Functional Status Assessment Patient has had a recent decline in their functional status and demonstrates the ability to make significant improvements in function in a reasonable and predictable amount of time.     Precautions / Restrictions Precautions Precautions: Fall Restrictions Weight Bearing Restrictions: No      Mobility  Bed Mobility Overal bed mobility: Needs Assistance Bed Mobility: Supine to Sit     Supine to sit: Supervision, HOB elevated     General bed mobility comments: use of bed rails, cueing for bed rails    Transfers Overall transfer level: Needs assistance Equipment used: Rolling walker (2 wheels) Transfers: Sit to/from Stand, Bed to chair/wheelchair/BSC Sit to Stand: Min guard   Step pivot transfers: Supervision            Ambulation/Gait Ambulation/Gait assistance: Supervision Gait Distance (Feet): 30 Feet Assistive device: Rolling walker (2 wheels)   Gait velocity: decreased     General Gait Details: very slow, cautious. pt educated on use of standing rest breaks as needed, which pt then led at least 2 during ambulation due to SOB but also LBP. no LOB noted  Stairs            Wheelchair Mobility    Modified Rankin (Stroke Patients Only)       Balance Overall balance assessment: Needs assistance Sitting-balance support: Feet supported Sitting balance-Leahy Scale: Good       Standing balance-Leahy Scale: Fair                               Pertinent Vitals/Pain Pain Assessment Pain Assessment: 0-10 Pain Score: 8  Pain Location: low  back Pain Descriptors / Indicators: Aching Pain Intervention(s): Limited activity within patient's tolerance, Monitored during session, Repositioned, Patient requesting pain meds-RN notified    Home Living Family/patient expects to be discharged to:: Private residence Living Arrangements: Alone Available Help at Discharge:  Family;Available PRN/intermittently Type of Home: House Home Access: Level entry       Home Layout: One level Home Equipment: Grab bars - tub/shower;Rolling Walker (2 wheels);Rollator (4 wheels);Cane - single point Additional Comments: Apartment at [facility] at [place] Prior Level of Function : Independent/Modified Independent             Mobility Comments: pt with varying AD use, primarily [facility] for community. Pt family now checks on her twice a day, drives her to appts, provides groceries ADLs Comments: Pt Mod I for self care tasks.     Hand Dominance   Dominant Hand: Right    Extremity/Trunk Assessment   Upper Extremity Assessment Upper Extremity Assessment: Generalized weakness    Lower Extremity Assessment Lower Extremity Assessment: Generalized weakness    Cervical / Trunk Assessment Cervical / Trunk Assessment: Normal  Communication   Communication: No difficulties  Cognition Arousal/Alertness: Awake/alert Behavior During Therapy: WFL for tasks assessed/performed Overall Cognitive Status: Within Functional Limits for tasks assessed                                          General Comments      Exercises     Assessment/Plan    PT Assessment Patient needs continued PT services  PT Problem List Decreased mobility;Decreased activity tolerance;Decreased balance;Decreased strength;Cardiopulmonary status limiting activity       PT Treatment Interventions DME instruction;Therapeutic exercise;Gait training;Balance training;Stair training;Neuromuscular re-education;Functional mobility training;Therapeutic activities;Patient/family education    PT Goals (Current goals can be found in the Care Plan section)  Acute Rehab PT Goals Patient Stated Goal: to get her strength better PT Goal Formulation: With patient Time For Goal Achievement: 11/03/21 Potential to Achieve Goals: Good    Frequency Min 2X/week      Co-evaluation               AM-PAC PT "6 Clicks" Mobility  Outcome Measure Help needed turning from your back to your side while in a flat bed without using bedrails?: None Help needed moving from lying on your back to sitting on the side of a flat bed without using bedrails?: A Little Help needed moving to and from a bed to a chair (including a wheelchair)?: None Help needed standing up from a chair using your arms (e.g., wheelchair or bedside chair)?: None Help needed to walk in hospital room?: None Help needed climbing 3-5 steps with a railing? : A Little 6 Click Score: 22    End of Session Equipment Utilized During Treatment: Gait belt Activity Tolerance: Patient tolerated treatment well Patient left: in chair;with chair alarm set;with call bell/phone within reach Nurse Communication: Mobility status PT Visit Diagnosis: Other abnormalities of gait and mobility (R26.89);Difficulty in walking, not elsewhere classified (R26.2);Muscle weakness (generalized) (M62.81)    Time: 1610-9604 PT Time Calculation (min) (ACUTE ONLY): 26 min   Charges:   PT Evaluation $PT Eval Low Complexity: 1 Low PT Treatments $Therapeutic Activity: 23-37 mins        Lieutenant Diego PT, DPT 1:14 PM,10/20/21

## 2021-10-20 NOTE — Progress Notes (Signed)
Progress Note  Patient Name: April [name] Date of Encounter: 10/20/2021  [facility] Cardiologist: Ida Rogue, MD   Subjective   Patient reports weakness, she plans on doing PT later. K 3.7 today and Scr 2.24>2.28. Wondering when she can go home.   Inpatient Medications    Scheduled Meds:  amiodarone  200 mg Oral Daily   enoxaparin (LOVENOX) injection  30 mg Subcutaneous Q24H   mometasone-formoterol  2 puff Inhalation BID   potassium chloride SA  40 mEq Oral BID   rosuvastatin  40 mg Oral Daily   Continuous Infusions:  PRN Meds: acetaminophen **OR** acetaminophen, albuterol, ondansetron **OR** ondansetron (ZOFRAN) IV, polyethylene glycol   Vital Signs    Vitals:   10/19/21 1929 10/19/21 2303 10/20/21 0434 10/20/21 0738  BP: (!) 116/46 (!) 123/41 (!) 103/36 (!) 112/47  Pulse: 68 70 67 70  Resp: '20 20 20 19  '$ Temp: 98 F (36.7 C) 98.6 F (37 C) 98.1 F (36.7 C) 99.5 F (37.5 C)  TempSrc:    Oral  SpO2: 98% 95% 95% 95%  Weight:      Height:        Intake/Output Summary (Last 24 hours) at 10/20/2021 0914 Last data filed at 10/19/2021 2204 Gross per 24 hour  Intake 240 ml  Output 600 ml  Net -360 ml      10/17/2021    3:30 PM 10/03/2021    1:36 PM 09/21/2021   12:40 PM  Last 3 Weights  Weight (lbs) 170 lb 183 lb 6 oz 180 lb 3.2 oz  Weight (kg) 77.111 kg 83.178 kg 81.738 kg      Telemetry    NSR PVCs, PAC, 1st degre AV block, HR 60-70s - Personally Reviewed  ECG    No new - Personally Reviewed  Physical Exam   GEN: No acute distress.   Neck: No JVD Cardiac: RRR, no murmurs, rubs, or gallops.  Respiratory: Clear to auscultation bilaterally. GI: Soft, nontender, non-distended  MS: No edema; No deformity. Neuro:  Nonfocal  Psych: Normal affect   Labs    High Sensitivity Troponin:   Recent Labs  Lab 10/17/21 1534 10/17/21 1849  TROPONINIHS 53* 52*     Chemistry Recent Labs  Lab 10/13/21 1113 10/17/21 1534 10/17/21 1534  10/17/21 1546 10/17/21 2317 10/18/21 0610 10/19/21 0609 10/20/21 0518  NA  --  132*   < >  --  132*  --  137 131*  K  --  2.2*   < >  --  <2.0* 2.3* 2.9* 3.7  CL  --  75*   < >  --  78*  --  86* 86*  CO2  --  39*   < >  --  41*  --  38* 34*  GLUCOSE  --  174*   < >  --  192*  --  114* 142*  BUN  --  45*   < >  --  49*  --  44* 46*  CREATININE  --  2.57*   < >  --  2.35*  --  2.24* 2.28*  CALCIUM  --  8.9   < >  --  8.5*  --  8.4* 8.3*  MG  --   --   --  2.3  --   --  2.3  --   PROT 6.2 6.1*  --   --   --   --   --   --   ALBUMIN 2.9*  2.6*  --   --   --   --   --   --   AST 70* 95*  --   --   --   --   --   --   ALT 36* 39  --   --   --   --   --   --   ALKPHOS 125* 105  --   --   --   --   --   --   BILITOT 1.4* 1.7*  --   --   --   --   --   --   GFRNONAA  --  18*   < >  --  20*  --  21* 21*  ANIONGAP  --  18*   < >  --  13  --  13 11   < > = values in this interval not displayed.    Lipids No results for input(s): "CHOL", "TRIG", "HDL", "LABVLDL", "LDLCALC", "CHOLHDL" in the last 168 hours.  Hematology Recent Labs  Lab 10/13/21 1113 10/17/21 1534  WBC 7.7 10.5  RBC 3.36* 3.55*  HGB 9.8* 10.2*  HCT 29.6* 31.0*  MCV 88.2 87.3  MCH  --  28.7  MCHC 33.1 32.9  RDW 19.3* 19.1*  PLT 188.0 191   Thyroid No results for input(s): "TSH", "FREET4" in the last 168 hours.  BNP Recent Labs  Lab 10/17/21 1534  BNP 296.7*    DDimer No results for input(s): "DDIMER" in the last 168 hours.   Radiology    No results found.  Cardiac Studies     Echocardiogram completed 08/10/2021 1. Left ventricular ejection fraction, by estimation, is 60 to 65%. The  left ventricle has normal function. The left ventricle has no regional  wall motion abnormalities. Left ventricular diastolic parameters are  consistent with Grade II diastolic  dysfunction (pseudonormalization).   2. Right ventricular systolic function is normal. The right ventricular  size is mildly enlarged. There is  mildly elevated pulmonary artery  systolic pressure. The estimated right ventricular systolic pressure is  43.1 mmHg.   3. Left atrial size was mildly dilated.   4. Right atrial size was mildly dilated.   5. The mitral valve is normal in structure. Mild mitral valve  regurgitation.   6. Tricuspid valve regurgitation is moderate.   7. The aortic valve is grossly normal. Aortic valve regurgitation is not  visualized.   8. The inferior vena cava is normal in size with greater than 50%  respiratory variability, suggesting right atrial pressure of 3 mmHg.    Patient Profile     81 y.o. female  with a history of coronary artery disease, HFpEF, essential hypertension, paroxysmal atrial fibrillation without anticoagulants due to recurrent bleeds.  CKD, DM type II, who has been seen for the evaluation of shortness of breath and weakness.  Assessment & Plan    Severe hypokalemia 2/2 overdiuresis - K 2.9>3.7 - goal K>4 - diuretics held - plan to continue torsemide but not metolazone at discharge  Shortness of breath HFpEF LVEF 60-65% - BNP 296 - Echo 4/12 showed LVEF 60-65%, no WMA, G2DD, mildly enlarged RV, mild MR, moderate TR - diuretics held as above. PTA torsemide '20mg'$  BID and intermittent IV lasix with metolazone -she has chronic lymphedema  - once kidney function goes back to baseline, restart Torsemide  CAD - no anginal symptoms reported - last [facility] 03/2016 - no plan for ischemic testing at this time  Paroxysmal  Afib - in NSR - on amiodarone - no a/c given h/o recurrent GIB - plan to see EP as outpatient for Watchman device  Acute on chronic CKD stage 3 - Scr on admission 2.57 - baseline Scr 2 - Scr stable, but still above baseline  For questions or updates, please contact [facility] Please consult [email or web address] for contact info under        Signed, Yannis Gumbs Ninfa Meeker, PA-C  10/20/2021, 9:14 AM

## 2021-10-20 NOTE — Progress Notes (Addendum)
Progress Note    April [name]  OAC:[id number] DOB: 1940/06/20  DOA: 10/17/2021 PCP: Einar Pheasant, MD      Brief Narrative:    Medical records reviewed and are as summarized below:  April [name] is a 81 y.o. female with past medical history significant for paroxysmal atrial fibrillation on amiodarone off Eliquis due to recurrent GI bleed, scheduled for Watchman procedure in July 2023, heart failure with preserved ejection fraction EF 60%, CAD status post PCI to the RCA in 2017, lymphedema, chronic kidney disease stage IIIb who presented to the ED for evaluation of shortness of breath which has worsened over the past week prior to admission, now with associated weakness.  She was recently seen by cardiology on 10/03/2021 for increased lower extremity edema, and her torsemide was increased and she was started on metolazone by her nephrologist.        Assessment/Plan:   Principal Problem:   Acute on Chronic dyspnea Active Problems:   Acute renal failure superimposed on stage 3b chronic kidney disease (HCC)   Hypokalemia   Chronic heart failure with preserved ejection fraction (HCC)   Electrolyte abnormality   CAD S/P PCI pRCA Promus DES 3.5 x 16 (4.1 mm), ostRPDA Promus DES 2.5 x 12 (2.7 mm)   Paroxysmal atrial fibrillation (HCC)   OSA on CPAP   Lymphedema   Chronic obstructive pulmonary disease, unspecified COPD type ([place])   History of recurrent GI bleed on apixaban   History of adverse reaction to anticoagulant medication   Acute respiratory failure with hypoxia (HCC)   Body mass index is 34.34 kg/m.  (Obesity)   Severe hypokalemia with metabolic alkalosis: Improving.  Continue potassium repletion.  Chronic diastolic CHF, acute on chronic dyspnea: Chest x-ray on 10/20/2021 showed new small bilateral pleural effusions with associated likely subsegmental atelectasis seen posteriorly.  Early pneumonia could not be excluded per radiologist.  Suspect infiltrates may  be due to fluid.  Torsemide has been restarted because of increasing cough.  AKI on CKD stage IIIb: Creatinine is stable.  Baseline creatinine likely around 1.5-1.75.  Monitor BMP.  Paroxysmal atrial fibrillation, CAD s/p PCI proximal RCA and OST right posterior descending artery: Continue aspirin, amiodarone and Lipitor.  She is not on anticoagulation because of recurrent GI bleeding  Hyponatremia: Sodium has gone down again from 137-131.  Continue to monitor.  General weakness: PT recommended home health therapy.  Consult OT.  OSA: Use CPAP at night  Diet Order             Diet Heart Room service appropriate? Yes; Fluid consistency: Thin  Diet effective now                            Consultants: Cardiologist, nephrologist  Procedures: None    Medications:    amiodarone  200 mg Oral Daily   enoxaparin (LOVENOX) injection  30 mg Subcutaneous Q24H   mometasone-formoterol  2 puff Inhalation BID   potassium chloride SA  40 mEq Oral BID   rosuvastatin  40 mg Oral Daily   torsemide  40 mg Oral Daily   Continuous Infusions:   Anti-infectives (From admission, onward)    None              Family Communication/Anticipated D/C date and plan/Code Status   DVT prophylaxis: enoxaparin (LOVENOX) injection 30 mg Start: 10/17/21 2200     Code Status: Full Code  Family Communication: Plan  discussed with [name], son, at the bedside Disposition Plan: Plan to discharge home in 2 to 3 days   Status is: Inpatient Remains inpatient appropriate because: Severe hypokalemia, general weakness       Subjective:   Interval events noted.  She complains of cough and generalized weakness.  She is worried about going home too soon because she feels so weak.  No chest pain, fever or chills.  Objective:    Vitals:   10/19/21 2303 10/20/21 0434 10/20/21 0738 10/20/21 1135  BP: (!) 123/41 (!) 103/36 (!) 112/47 (!) 129/42  Pulse: 70 67 70 71  Resp: '20 20  19 20  '$ Temp: 98.6 F (37 C) 98.1 F (36.7 C) 99.5 F (37.5 C) 97.7 F (36.5 C)  TempSrc:   Oral Oral  SpO2: 95% 95% 95% 96%  Weight:      Height:       No data found.   Intake/Output Summary (Last 24 hours) at 10/20/2021 1523 Last data filed at 10/20/2021 1140 Gross per 24 hour  Intake 240 ml  Output 827 ml  Net -587 ml   Filed Weights   10/17/21 1530  Weight: 77.1 kg    Exam:  GEN: NAD SKIN: No rash EYES: EOMI ENT: MMM CV: RRR PULM: CTA B ABD: soft, obese, NT, +BS CNS: AAO x 3, non focal EXT: Mild bilateral leg edema, no tenderness        Data Reviewed:   I have personally reviewed following labs and imaging studies:  Labs: Labs show the following:   Basic Metabolic Panel: Recent Labs  Lab 10/17/21 1534 10/17/21 1546 10/17/21 2317 10/18/21 0610 10/19/21 0609 10/20/21 0518  NA 132*  --  132*  --  137 131*  K 2.2*  --  <2.0*   < > 2.9* 3.7  CL 75*  --  78*  --  86* 86*  CO2 39*  --  41*  --  38* 34*  GLUCOSE 174*  --  192*  --  114* 142*  BUN 45*  --  49*  --  44* 46*  CREATININE 2.57*  --  2.35*  --  2.24* 2.28*  CALCIUM 8.9  --  8.5*  --  8.4* 8.3*  MG  --  2.3  --   --  2.3  --   PHOS  --   --   --   --  3.2  --    < > = values in this interval not displayed.   GFR Estimated Creatinine Clearance: 17.1 mL/min (A) (by C-G formula based on SCr of 2.28 mg/dL (H)). Liver Function Tests: Recent Labs  Lab 10/17/21 1534  AST 95*  ALT 39  ALKPHOS 105  BILITOT 1.7*  PROT 6.1*  ALBUMIN 2.6*   No results for input(s): "LIPASE", "AMYLASE" in the last 168 hours. No results for input(s): "AMMONIA" in the last 168 hours. Coagulation profile No results for input(s): "INR", "PROTIME" in the last 168 hours.  CBC: Recent Labs  Lab 10/17/21 1534  WBC 10.5  NEUTROABS 7.3  HGB 10.2*  HCT 31.0*  MCV 87.3  PLT 191   Cardiac Enzymes: No results for input(s): "CKTOTAL", "CKMB", "CKMBINDEX", "TROPONINI" in the last 168 hours. BNP (last 3  results) No results for input(s): "PROBNP" in the last 8760 hours. CBG: No results for input(s): "GLUCAP" in the last 168 hours. D-Dimer: No results for input(s): "DDIMER" in the last 72 hours. Hgb A1c: No results for input(s): "HGBA1C" in the last  72 hours. Lipid Profile: No results for input(s): "CHOL", "HDL", "LDLCALC", "TRIG", "CHOLHDL", "LDLDIRECT" in the last 72 hours. Thyroid function studies: No results for input(s): "TSH", "T4TOTAL", "T3FREE", "THYROIDAB" in the last 72 hours.  Invalid input(s): "FREET3" Anemia work up: No results for input(s): "VITAMINB12", "FOLATE", "FERRITIN", "TIBC", "IRON", "RETICCTPCT" in the last 72 hours. Sepsis Labs: Recent Labs  Lab 10/17/21 1534  WBC 10.5    Microbiology No results found for this or any previous visit (from the past 240 hour(s)).  Procedures and diagnostic studies:  DG Chest 2 View  Result Date: 10/20/2021 CLINICAL DATA:  Shortness of breath and cough. EXAM: CHEST - 2 VIEW COMPARISON:  Chest two views 10/17/2021; CT chest 09/19/2021 FINDINGS: Cardiac silhouette is again moderately enlarged. There is an oval density overlying the inferior right cardiac silhouette corresponding to the moderate-to-large sliding hiatal hernia and surrounding supradiaphragmatic herniated mesenteric fat seen on prior CT. Mediastinal contours are within normal limits. Mild calcification within aortic arch. Small bilateral pleural effusions appear new from 10/17/2021 chest radiographs. Mild posteroinferior lung likely subsegmental atelectasis. No pneumothorax. Mild dextrocurvature of the midthoracic spine with moderate multilevel degenerative disc changes. IMPRESSION: 1. Moderate to large sliding hiatal hernia seen on prior CT. 2. New small bilateral pleural effusions with associated likely subsegmental atelectasis seen posteriorly on lateral view. Cannot entirely exclude early pneumonia. Electronically Signed   By: Yvonne Kendall M.D.   On: 10/20/2021 10:03                LOS: 2 days   Joal Eakle  [facility]   Pager on [email or web address]. If 7PM-7AM, please contact night-coverage at [email or web address]     10/20/2021, 3:23 PM

## 2021-10-20 NOTE — Evaluation (Signed)
Occupational Therapy Evaluation Patient Details Name: April [name] MRN: [id number] DOB: [birth year] Today's Date: 10/20/2021   History of Present Illness Pt is an 81 yo female that was admitted for shortness of breath, weakness, and hypokalemia. PMH of HTN, sleep apnea, afib, DM, lymphedema, CKD.   Clinical Impression   Chart reviewed, RN cleared pt for participation in OT evaluation. PTA pt reports she continues to perform ADL with MOD I, feels she is getting progressively weaker. Pt presents with deficits in strength, endurance, activity tolerance, all affecting optimal ADL completion. Amb to bathroom with RW with supervision, toilet transfer to regular toilet with supervision, grooming tasks in seated with SET UP. Pt provided education re: energy conservation techniques during ADL task completion. Pt also reports she is motivated to return home vs rehab. Pt is left in bedside chair, NAD, all needs met. Recommend HHOT following discharge.      Recommendations for follow up therapy are one component of a multi-disciplinary discharge planning process, led by the attending physician.  Recommendations may be updated based on patient status, additional functional criteria and insurance authorization.   Follow Up Recommendations  Home health OT    Assistance Recommended at Discharge Intermittent Supervision/Assistance  Patient can return home with the following A little help with bathing/dressing/bathroom;Assistance with cooking/housework    Functional Status Assessment  Patient has had a recent decline in their functional status and demonstrates the ability to make significant improvements in function in a reasonable and predictable amount of time.  Equipment Recommendations  Other (comment) (pt has recommended equipment at home)    Recommendations for Other Services       Precautions / Restrictions Precautions Precautions: Fall Restrictions Weight Bearing Restrictions: No       Mobility Bed Mobility               General bed mobility comments: NT pt in chair pre/post session    Transfers Overall transfer level: Needs assistance Equipment used: Rolling walker (2 wheels) Transfers: Sit to/from Stand Sit to Stand: Supervision                  Balance Overall balance assessment: Needs assistance Sitting-balance support: Feet supported       Standing balance support: Reliant on assistive device for balance Standing balance-Leahy Scale: Fair                             ADL either performed or assessed with clinical judgement   ADL Overall ADL's : Needs assistance/impaired     Grooming: brushing teeth, sitting, SET UP                Lower Body Dressing: Minimal assistance Lower Body Dressing Details (indicate cue type and reason): due to LE edema, pt recently started using slip on shoes Toilet Transfer: Supervision/safety;Regular Toilet;Ambulation           Functional mobility during ADLs: Supervision/safety;Rolling walker (2 wheels) (house hold distances, to bathroom and back)       Vision Patient Visual Report: No change from baseline       Perception     Praxis      Pertinent Vitals/Pain Pain Assessment Pain Assessment: Faces Faces Pain Scale: Hurts a little bit Pain Location: generalized Pain Descriptors / Indicators: Guarding Pain Intervention(s): Limited activity within patient's tolerance, Monitored during session, Repositioned     Hand Dominance Right   Extremity/Trunk Assessment Upper Extremity Assessment Upper Extremity Assessment:  Generalized weakness   Lower Extremity Assessment Lower Extremity Assessment: Generalized weakness   Cervical / Trunk Assessment Cervical / Trunk Assessment: Normal   Communication Communication Communication: No difficulties   Cognition Arousal/Alertness: Awake/alert Behavior During Therapy: WFL for tasks assessed/performed Overall Cognitive Status:  Within Functional Limits for tasks assessed                                       General Comments  pt spo2 >90% on RA throughout;    Exercises Other Exercises Other Exercises: edu re: role of OT, Role of rehab, discharge recommendations, home safety, energy conservation techniques   Shoulder Instructions      Home Living Family/patient expects to be discharged to:: Private residence ([facility] at [facility]) Living Arrangements: Alone Available Help at Discharge: Family;Available PRN/intermittently (son checks on her (he works during the day)) Type of Home: House Home Access: Level entry     Home Layout: One level     Bathroom Shower/Tub: Tub/shower unit   Bathroom Toilet: Handicapped height     Home Equipment: Grab bars - tub/shower;Rolling Walker (2 wheels);Rollator (4 wheels);Cane - single point   Additional Comments: Apartment at [facility]      Prior Functioning/Environment Prior Level of Function : Independent/Modified Independent             Mobility Comments: varying AD use, most recent use of rollator ADLs Comments: pt MOD I in ADLs; intermittent assist with IADLs        OT Problem List: Decreased strength;Decreased activity tolerance      OT Treatment/Interventions: Self-care/ADL training;Patient/family education;DME and/or AE instruction;Energy conservation;Therapeutic activities;Therapeutic exercise    OT Goals(Current goals can be found in the care plan section) Acute Rehab OT Goals Patient Stated Goal: go home OT Goal Formulation: With patient Time For Goal Achievement: 11/03/21 Potential to Achieve Goals: Good ADL Goals Pt Will Perform Grooming: sitting;standing;with modified independence Pt Will Perform Lower Body Dressing: with modified independence;with adaptive equipment;sit to/from stand Pt Will Transfer to Toilet: with modified independence;ambulating Pt Will Perform Toileting - Clothing  Manipulation and hygiene: with modified independence;sit to/from stand  OT Frequency: Min 2X/week    Co-evaluation              AM-PAC OT "6 Clicks" Daily Activity     Outcome Measure Help from another person eating meals?: None Help from another person taking care of personal grooming?: None Help from another person toileting, which includes using toliet, bedpan, or urinal?: A Little Help from another person bathing (including washing, rinsing, drying)?: A Little Help from another person to put on and taking off regular upper body clothing?: None Help from another person to put on and taking off regular lower body clothing?: A Little 6 Click Score: 21   End of Session Equipment Utilized During Treatment: Rolling walker (2 wheels) Nurse Communication: Mobility status  Activity Tolerance: Patient tolerated treatment well Patient left: in chair;with call bell/phone within reach  OT Visit Diagnosis: Muscle weakness (generalized) (M62.81)                Time: 1508-1527 OT Time Calculation (min): 19 min Charges:  OT General Charges $OT Visit: 1 Visit OT Evaluation $OT Eval Low Complexity: 1 Low  Anna Unwin, OTD OTR/L  10/20/21, 4:33 PM  

## 2021-10-20 NOTE — Progress Notes (Signed)
[facility]  ROUNDING NOTE   Subjective:   April [name] is a 81 year old female with past medical history hypertension, sleep apnea with CPAP, paroxysmal atrial fibrillation on amiodarone.  Heart failure with preserved EF, diabetes, lymphedema, chronic kidney disease stage IIIb.  Patient presents to the emergency department with complaints of progressive shortness of breath and weakness.  Patient has been admitted for Shortness of breath [R06.02] Hypokalemia [E87.6] Acute respiratory failure with hypoxia ([place]) [J96.01]  Patient is known to our practice and is followed outpatient by Dr. Candiss Norse.  Patient was last seen in office on 6/12//23 for routine follow-up.    Patient seen sitting up Alert and oriented Tolerating meals Weaned to room air Mild lower extremity edema Continues to complain of weakness  Objective:  Vital signs in last 24 hours:  Temp:  [97.7 F (36.5 C)-99.5 F (37.5 C)] 97.7 F (36.5 C) (06/22 1135) Pulse Rate:  [65-71] 71 (06/22 1135) Resp:  [19-20] 20 (06/22 1135) BP: (103-129)/(36-47) 129/42 (06/22 1135) SpO2:  [95 %-98 %] 96 % (06/22 1135)  Weight change:  Filed Weights   10/17/21 1530  Weight: 77.1 kg    Intake/Output: I/O last 3 completed shifts: In: 480 [P.O.:480] Out: 1300 [Urine:1300]   Intake/Output this shift:  Total I/O In: -  Out: 227 [Urine:226; Stool:1]  Physical Exam: General: NAD, resting comfortably  Head: Normocephalic, atraumatic.  Dry oral mucosa  Eyes: Anicteric  Lungs:  Clear to auscultation, normal effort  Heart: Regular rate and rhythm  Abdomen:  Soft, nontender, nondistended  Extremities: 1+ peripheral edema.  Neurologic: Nonfocal, moving all four extremities  Skin: No lesions  Access: None    Basic Metabolic Panel: Recent Labs  Lab 10/17/21 1534 10/17/21 1546 10/17/21 2317 10/18/21 0610 10/19/21 0609 10/20/21 0518  NA 132*  --  132*  --  137 131*  K 2.2*  --  <2.0* 2.3* 2.9* 3.7  CL 75*  --   78*  --  86* 86*  CO2 39*  --  41*  --  38* 34*  GLUCOSE 174*  --  192*  --  114* 142*  BUN 45*  --  49*  --  44* 46*  CREATININE 2.57*  --  2.35*  --  2.24* 2.28*  CALCIUM 8.9  --  8.5*  --  8.4* 8.3*  MG  --  2.3  --   --  2.3  --   PHOS  --   --   --   --  3.2  --      Liver Function Tests: Recent Labs  Lab 10/17/21 1534  AST 95*  ALT 39  ALKPHOS 105  BILITOT 1.7*  PROT 6.1*  ALBUMIN 2.6*    No results for input(s): "LIPASE", "AMYLASE" in the last 168 hours. No results for input(s): "AMMONIA" in the last 168 hours.  CBC: Recent Labs  Lab 10/17/21 1534  WBC 10.5  NEUTROABS 7.3  HGB 10.2*  HCT 31.0*  MCV 87.3  PLT 191     Cardiac Enzymes: No results for input(s): "CKTOTAL", "CKMB", "CKMBINDEX", "TROPONINI" in the last 168 hours.  BNP: Invalid input(s): "POCBNP"  CBG: No results for input(s): "GLUCAP" in the last 168 hours.  Microbiology: Results for orders placed or performed during the hospital encounter of 07/07/21  Resp Panel by RT-PCR (Flu A&B, Covid) Nasopharyngeal Swab     Status: None   Collection Time: 07/07/21  8:01 PM   Specimen: Nasopharyngeal Swab; Nasopharyngeal(NP) swabs in vial transport  medium  Result Value Ref Range Status   SARS Coronavirus 2 by RT PCR NEGATIVE NEGATIVE Final    Comment: (NOTE) SARS-CoV-2 target nucleic acids are NOT DETECTED.  The SARS-CoV-2 RNA is generally detectable in upper respiratory specimens during the acute phase of infection. The lowest concentration of SARS-CoV-2 viral copies this assay can detect is 138 copies/mL. A negative result does not preclude SARS-Cov-2 infection and should not be used as the sole basis for treatment or other patient management decisions. A negative result may occur with  improper specimen collection/handling, submission of specimen other than nasopharyngeal swab, presence of viral mutation(s) within the areas targeted by this assay, and inadequate number of viral copies(<138  copies/mL). A negative result must be combined with clinical observations, patient history, and epidemiological information. The expected result is Negative.  Fact Sheet for Patients:  EntrepreneurPulse.com.au  Fact Sheet for Healthcare Providers:  IncredibleEmployment.be  This test is no t yet approved or cleared by the [place] FDA and  has been authorized for detection and/or diagnosis of SARS-CoV-2 by FDA under an Emergency Use Authorization (EUA). This EUA will remain  in effect (meaning this test can be used) for the duration of the COVID-19 declaration under Section 564(b)(1) of the Act, 21 U.S.C.section 360bbb-3(b)(1), unless the authorization is terminated  or revoked sooner.       Influenza A by PCR NEGATIVE NEGATIVE Final   Influenza B by PCR NEGATIVE NEGATIVE Final    Comment: (NOTE) The Xpert Xpress SARS-CoV-2/FLU/RSV plus assay is intended as an aid in the diagnosis of influenza from Nasopharyngeal swab specimens and should not be used as a sole basis for treatment. Nasal washings and aspirates are unacceptable for Xpert Xpress SARS-CoV-2/FLU/RSV testing.  Fact Sheet for Patients: EntrepreneurPulse.com.au  Fact Sheet for Healthcare Providers: IncredibleEmployment.be  This test is not yet approved or cleared by the [place] FDA and has been authorized for detection and/or diagnosis of SARS-CoV-2 by FDA under an Emergency Use Authorization (EUA). This EUA will remain in effect (meaning this test can be used) for the duration of the COVID-19 declaration under Section 564(b)(1) of the Act, 21 U.S.C. section 360bbb-3(b)(1), unless the authorization is terminated or revoked.  Performed at [facility], [address] [place],  [address]     Coagulation Studies: No results for input(s): "LABPROT", "INR" in the last 72 hours.  Urinalysis: Recent Labs     10/17/21 2030  COLORURINE STRAW*  LABSPEC 1.005  PHURINE 6.0  GLUCOSEU NEGATIVE  HGBUR MODERATE*  BILIRUBINUR NEGATIVE  KETONESUR NEGATIVE  PROTEINUR 30*  NITRITE NEGATIVE  LEUKOCYTESUR NEGATIVE       Imaging: DG Chest 2 View  Result Date: 10/20/2021 CLINICAL DATA:  Shortness of breath and cough. EXAM: CHEST - 2 VIEW COMPARISON:  Chest two views 10/17/2021; CT chest 09/19/2021 FINDINGS: Cardiac silhouette is again moderately enlarged. There is an oval density overlying the inferior right cardiac silhouette corresponding to the moderate-to-large sliding hiatal hernia and surrounding supradiaphragmatic herniated mesenteric fat seen on prior CT. Mediastinal contours are within normal limits. Mild calcification within aortic arch. Small bilateral pleural effusions appear new from 10/17/2021 chest radiographs. Mild posteroinferior lung likely subsegmental atelectasis. No pneumothorax. Mild dextrocurvature of the midthoracic spine with moderate multilevel degenerative disc changes. IMPRESSION: 1. Moderate to large sliding hiatal hernia seen on prior CT. 2. New small bilateral pleural effusions with associated likely subsegmental atelectasis seen posteriorly on lateral view. Cannot entirely exclude early pneumonia. Electronically Signed   By: Jori Moll  Viola M.D.   On: 10/20/2021 10:03     Medications:     amiodarone  200 mg Oral Daily   enoxaparin (LOVENOX) injection  30 mg Subcutaneous Q24H   mometasone-formoterol  2 puff Inhalation BID   potassium chloride SA  40 mEq Oral BID   rosuvastatin  40 mg Oral Daily   torsemide  40 mg Oral Daily   acetaminophen **OR** acetaminophen, albuterol, ondansetron **OR** ondansetron (ZOFRAN) IV, polyethylene glycol  Assessment/ Plan:  [name] is a 81 y.o.  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  Heart failure with preserved EF, diabetes, lymphedema, chronic kidney  disease stage IIIb.  Patient presents to the emergency department with complaints of progressive shortness of breath and weakness.  Patient has been admitted for Shortness of breath [R06.02] Hypokalemia [E87.6] Acute respiratory failure with hypoxia (HCC) [J96.01]   Acute Kidney Injury on chronic kidney disease stage 3B with baseline creatinine 1.75 and GFR of 29 on 08/24/2021.  Acute kidney injury secondary to overdiuresis Chronic kidney disease is secondary to hypertension, advanced age, diabetes No IV contrast exposure.  Further diuresis held.   Renal function stable. Urine output 681m overnight. Recommend starting home regimen of Torsemide '40mg'$  daily.   Lab Results  Component Value Date   CREATININE 2.28 (H) 10/20/2021   CREATININE 2.24 (H) 10/19/2021   CREATININE 2.35 (H) 10/17/2021    Intake/Output Summary (Last 24 hours) at 10/20/2021 1309 Last data filed at 10/20/2021 1140 Gross per 24 hour  Intake 240 ml  Output 827 ml  Net -587 ml    2.  Hypokalemia/hyponatremia.  Likely due to overdiuresis and kidney injury.  Sodium 131 today.  Potassium corrected to 3.7 with IV and oral supplementation.    3. 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 Most recent hemoglobin A1c is 6.3 on 09/21/2021.   4.  Hypertension with chronic kidney disease.  Home regimen includes hydralazine and torsemide.Torsemide currently held in setting of kidney injury.  Outpatient blood pressure noted at 137/59.  Blood pressure improved to 129/42   LOS: 2 Takyra Cantrall 6/22/20231:09 PM

## 2021-10-21 DIAGNOSIS — I890 Lymphedema, not elsewhere classified: Secondary | ICD-10-CM

## 2021-10-21 DIAGNOSIS — I5032 Chronic diastolic (congestive) heart failure: Secondary | ICD-10-CM | POA: Diagnosis not present

## 2021-10-21 DIAGNOSIS — I480 Paroxysmal atrial fibrillation: Secondary | ICD-10-CM | POA: Diagnosis not present

## 2021-10-21 DIAGNOSIS — R0609 Other forms of dyspnea: Secondary | ICD-10-CM | POA: Diagnosis not present

## 2021-10-21 DIAGNOSIS — Z8719 Personal history of other diseases of the digestive system: Secondary | ICD-10-CM

## 2021-10-21 DIAGNOSIS — E876 Hypokalemia: Secondary | ICD-10-CM | POA: Diagnosis not present

## 2021-10-21 DIAGNOSIS — R531 Weakness: Secondary | ICD-10-CM

## 2021-10-21 LAB — BASIC METABOLIC PANEL
Anion gap: 9 (ref 5–15)
BUN: 47 mg/dL — ABNORMAL HIGH (ref 8–23)
CO2: 34 mmol/L — ABNORMAL HIGH (ref 22–32)
Calcium: 8.2 mg/dL — ABNORMAL LOW (ref 8.9–10.3)
Chloride: 89 mmol/L — ABNORMAL LOW (ref 98–111)
Creatinine, Ser: 2.3 mg/dL — ABNORMAL HIGH (ref 0.44–1.00)
GFR, Estimated: 21 mL/min — ABNORMAL LOW (ref >60)
Glucose, Bld: 95 mg/dL (ref 70–99)
Potassium: 3.7 mmol/L (ref 3.5–5.1)
Sodium: 132 mmol/L — ABNORMAL LOW (ref 135–145)

## 2021-10-21 MED ORDER — METOLAZONE 2.5 MG PO TABS: 2.5000 mg | ORAL_TABLET | Freq: Every day | ORAL | 0 refills | Status: DC | PRN

## 2021-10-21 MED ORDER — TORSEMIDE 40 MG PO TABS: 40.0000 mg | ORAL_TABLET | Freq: Every day | ORAL | Status: DC | PRN

## 2021-10-21 NOTE — TOC Transition Note (Addendum)
Transition of Care [facility]) - CM/SW Discharge Note   Patient Details  Name: April [name] MRN: [id number] Date of Birth: Sep 23, 1940  Transition of Care [facility]) CM/SW Contact:  Durwin Glaze, LCSW Phone Number: 10/21/2021, 1:13 PM   Clinical Narrative:    Pt has orders to discharge home today with home health. CSW arranged HH with Feliberto Gottron, [facility] for PT, OT, RN. [facility] will see patient on June 26, 202. No further concerns. CSW signing off.    Final next level of care: Home w Home Health Services Barriers to Discharge: Barriers Resolved   Patient Goals and CMS Choice        Discharge Placement                    Patient and family notified of of transfer: 10/21/21  Discharge Plan and Services                          HH Arranged: PT, OT, RN [facility] Agency: [facility] Date HH Agency Contacted: 10/21/21   Representative spoke with at [facility] Agency: Barbara Cower  Social Determinants of Health (SDOH) Interventions     Readmission Risk Interventions    10/19/2021   12:27 PM  Readmission Risk Prevention Plan  Transportation Screening Complete  PCP or Specialist Appt within 3-5 Days Complete  HRI or Home Care Consult Complete  Social Work Consult for Recovery Care Planning/Counseling Complete  Palliative Care Screening Complete  Medication Review [employer]) Complete

## 2021-10-24 ENCOUNTER — Telehealth: Payer: Self-pay

## 2021-10-24 DIAGNOSIS — G43909 Migraine, unspecified, not intractable, without status migrainosus: Secondary | ICD-10-CM | POA: Diagnosis not present

## 2021-10-24 DIAGNOSIS — I2510 Atherosclerotic heart disease of native coronary artery without angina pectoris: Secondary | ICD-10-CM | POA: Diagnosis not present

## 2021-10-24 DIAGNOSIS — K449 Diaphragmatic hernia without obstruction or gangrene: Secondary | ICD-10-CM | POA: Diagnosis not present

## 2021-10-24 DIAGNOSIS — I5032 Chronic diastolic (congestive) heart failure: Secondary | ICD-10-CM | POA: Diagnosis not present

## 2021-10-24 DIAGNOSIS — I130 Hypertensive heart and chronic kidney disease with heart failure and stage 1 through stage 4 chronic kidney disease, or unspecified chronic kidney disease: Secondary | ICD-10-CM | POA: Diagnosis not present

## 2021-10-24 DIAGNOSIS — N1832 Chronic kidney disease, stage 3b: Secondary | ICD-10-CM | POA: Diagnosis not present

## 2021-10-24 DIAGNOSIS — M810 Age-related osteoporosis without current pathological fracture: Secondary | ICD-10-CM | POA: Diagnosis not present

## 2021-10-24 DIAGNOSIS — I890 Lymphedema, not elsewhere classified: Secondary | ICD-10-CM | POA: Diagnosis not present

## 2021-10-24 DIAGNOSIS — K219 Gastro-esophageal reflux disease without esophagitis: Secondary | ICD-10-CM | POA: Diagnosis not present

## 2021-10-24 DIAGNOSIS — R32 Unspecified urinary incontinence: Secondary | ICD-10-CM | POA: Diagnosis not present

## 2021-10-24 DIAGNOSIS — I252 Old myocardial infarction: Secondary | ICD-10-CM | POA: Diagnosis not present

## 2021-10-24 DIAGNOSIS — Z79891 Long term (current) use of opiate analgesic: Secondary | ICD-10-CM | POA: Diagnosis not present

## 2021-10-24 DIAGNOSIS — E875 Hyperkalemia: Secondary | ICD-10-CM | POA: Diagnosis not present

## 2021-10-24 DIAGNOSIS — I480 Paroxysmal atrial fibrillation: Secondary | ICD-10-CM | POA: Diagnosis not present

## 2021-10-24 DIAGNOSIS — G4733 Obstructive sleep apnea (adult) (pediatric): Secondary | ICD-10-CM | POA: Diagnosis not present

## 2021-10-24 DIAGNOSIS — Z7951 Long term (current) use of inhaled steroids: Secondary | ICD-10-CM | POA: Diagnosis not present

## 2021-10-24 DIAGNOSIS — Z9181 History of falling: Secondary | ICD-10-CM | POA: Diagnosis not present

## 2021-10-24 DIAGNOSIS — J449 Chronic obstructive pulmonary disease, unspecified: Secondary | ICD-10-CM | POA: Diagnosis not present

## 2021-10-24 DIAGNOSIS — D631 Anemia in chronic kidney disease: Secondary | ICD-10-CM | POA: Diagnosis not present

## 2021-10-24 DIAGNOSIS — E7800 Pure hypercholesterolemia, unspecified: Secondary | ICD-10-CM | POA: Diagnosis not present

## 2021-10-24 DIAGNOSIS — I447 Left bundle-branch block, unspecified: Secondary | ICD-10-CM | POA: Diagnosis not present

## 2021-10-24 NOTE — Telephone Encounter (Signed)
Transition Care Management Follow-up Telephone Call Date of discharge and from where: 10/21/21 [facility] How have you been since you were released from the hospital? I am still weak. Pacing self with activity. Eating/drinking without issue. Denies pain, headache, falls, dizziness, blurred vision Any questions or concerns? No  Items Reviewed: Did the pt receive and understand the discharge instructions provided? Yes  Medications obtained and verified? Yes  Any new allergies since your discharge? No  Dietary orders reviewed? Yes, low sodium heart healthy. Do you have support at home? Yes , staff as needed.   Home Care and Equipment/Supplies: Were home health services ordered? Yes, HH in progress. Assessment completed today.   Functional Questionnaire: (I = Independent and D = Dependent) ADLs: Staff assist as needed.  Bathing/Dressing- I  Meal Prep- Staff assist.    Eating- I  Maintaining continence- D-managed with daily brief. Bladder incontinence. Bowels okay.   Transferring/Ambulation- Cane/walker  Managing Meds- I  Follow up appointments reviewed:  PCP Hospital f/u appt confirmed? Yes  Scheduled to see PCP on 10/27/21 @ 4:00. Are transportation arrangements needed? No  If their condition worsens, is the pt aware to call PCP or go to the Emergency Dept.? Yes Was the patient provided with contact information for the PCP's office or ED? Yes Was to pt encouraged to call back with questions or concerns? Yes

## 2021-10-26 DIAGNOSIS — N1832 Chronic kidney disease, stage 3b: Secondary | ICD-10-CM | POA: Diagnosis not present

## 2021-10-26 DIAGNOSIS — I5032 Chronic diastolic (congestive) heart failure: Secondary | ICD-10-CM | POA: Diagnosis not present

## 2021-10-26 DIAGNOSIS — D631 Anemia in chronic kidney disease: Secondary | ICD-10-CM | POA: Diagnosis not present

## 2021-10-26 DIAGNOSIS — I2510 Atherosclerotic heart disease of native coronary artery without angina pectoris: Secondary | ICD-10-CM | POA: Diagnosis not present

## 2021-10-26 DIAGNOSIS — I252 Old myocardial infarction: Secondary | ICD-10-CM | POA: Diagnosis not present

## 2021-10-26 DIAGNOSIS — I130 Hypertensive heart and chronic kidney disease with heart failure and stage 1 through stage 4 chronic kidney disease, or unspecified chronic kidney disease: Secondary | ICD-10-CM | POA: Diagnosis not present

## 2021-10-26 LAB — BLOOD GAS, ARTERIAL
Acid-Base Excess: 21.6 mmol/L — ABNORMAL HIGH (ref 0.0–2.0)
Bicarbonate: 46.1 mmol/L — ABNORMAL HIGH (ref 20.0–28.0)
O2 Content: 2 L/min
O2 Saturation: 97.4 %
Patient temperature: 37
pCO2 arterial: 47 mmHg (ref 32–48)
pH, Arterial: 7.6 — ABNORMAL HIGH (ref 7.35–7.45)
pO2, Arterial: 78 mmHg — ABNORMAL LOW (ref 83–108)

## 2021-10-27 ENCOUNTER — Encounter: Payer: Self-pay | Admitting: Internal Medicine

## 2021-10-27 ENCOUNTER — Encounter: Payer: Self-pay | Admitting: Family

## 2021-10-27 ENCOUNTER — Ambulatory Visit (INDEPENDENT_AMBULATORY_CARE_PROVIDER_SITE_OTHER): Payer: Medicare Other | Admitting: Internal Medicine

## 2021-10-27 ENCOUNTER — Other Ambulatory Visit
Admission: RE | Admit: 2021-10-27 | Discharge: 2021-10-27 | Disposition: A | Discharge status: Home / Self Care | Payer: Medicare Other | Source: Ambulatory Visit | Attending: Family | Admitting: Family

## 2021-10-27 ENCOUNTER — Ambulatory Visit (HOSPITAL_BASED_OUTPATIENT_CLINIC_OR_DEPARTMENT_OTHER): Payer: Medicare Other | Admitting: Family

## 2021-10-27 VITALS — BP 136/68 | HR 70 | Temp 98.6°F | Resp 19 | Ht 59.0 in | Wt 172.0 lb

## 2021-10-27 VITALS — BP 136/58 | HR 81 | Resp 16 | Ht 59.0 in | Wt 175.5 lb

## 2021-10-27 DIAGNOSIS — R251 Tremor, unspecified: Secondary | ICD-10-CM | POA: Insufficient documentation

## 2021-10-27 DIAGNOSIS — N184 Chronic kidney disease, stage 4 (severe): Secondary | ICD-10-CM | POA: Diagnosis not present

## 2021-10-27 DIAGNOSIS — D631 Anemia in chronic kidney disease: Secondary | ICD-10-CM | POA: Insufficient documentation

## 2021-10-27 DIAGNOSIS — E1122 Type 2 diabetes mellitus with diabetic chronic kidney disease: Secondary | ICD-10-CM

## 2021-10-27 DIAGNOSIS — Z79899 Other long term (current) drug therapy: Secondary | ICD-10-CM | POA: Insufficient documentation

## 2021-10-27 DIAGNOSIS — J449 Chronic obstructive pulmonary disease, unspecified: Secondary | ICD-10-CM | POA: Diagnosis not present

## 2021-10-27 DIAGNOSIS — D509 Iron deficiency anemia, unspecified: Secondary | ICD-10-CM

## 2021-10-27 DIAGNOSIS — D649 Anemia, unspecified: Secondary | ICD-10-CM | POA: Diagnosis not present

## 2021-10-27 DIAGNOSIS — I5032 Chronic diastolic (congestive) heart failure: Secondary | ICD-10-CM | POA: Diagnosis not present

## 2021-10-27 DIAGNOSIS — I700 Atherosclerosis of aorta: Secondary | ICD-10-CM

## 2021-10-27 DIAGNOSIS — R059 Cough, unspecified: Secondary | ICD-10-CM | POA: Insufficient documentation

## 2021-10-27 DIAGNOSIS — I25110 Atherosclerotic heart disease of native coronary artery with unstable angina pectoris: Secondary | ICD-10-CM | POA: Diagnosis not present

## 2021-10-27 DIAGNOSIS — I5033 Acute on chronic diastolic (congestive) heart failure: Secondary | ICD-10-CM | POA: Diagnosis not present

## 2021-10-27 DIAGNOSIS — I130 Hypertensive heart and chronic kidney disease with heart failure and stage 1 through stage 4 chronic kidney disease, or unspecified chronic kidney disease: Secondary | ICD-10-CM | POA: Insufficient documentation

## 2021-10-27 DIAGNOSIS — Z9861 Coronary angioplasty status: Secondary | ICD-10-CM

## 2021-10-27 DIAGNOSIS — D6859 Other primary thrombophilia: Secondary | ICD-10-CM

## 2021-10-27 DIAGNOSIS — I10 Essential (primary) hypertension: Secondary | ICD-10-CM | POA: Diagnosis not present

## 2021-10-27 DIAGNOSIS — Z9989 Dependence on other enabling machines and devices: Secondary | ICD-10-CM

## 2021-10-27 DIAGNOSIS — R7989 Other specified abnormal findings of blood chemistry: Secondary | ICD-10-CM | POA: Diagnosis not present

## 2021-10-27 DIAGNOSIS — N183 Chronic kidney disease, stage 3 unspecified: Secondary | ICD-10-CM | POA: Insufficient documentation

## 2021-10-27 DIAGNOSIS — R531 Weakness: Secondary | ICD-10-CM

## 2021-10-27 DIAGNOSIS — K219 Gastro-esophageal reflux disease without esophagitis: Secondary | ICD-10-CM

## 2021-10-27 DIAGNOSIS — E876 Hypokalemia: Secondary | ICD-10-CM

## 2021-10-27 DIAGNOSIS — I480 Paroxysmal atrial fibrillation: Secondary | ICD-10-CM

## 2021-10-27 DIAGNOSIS — G4730 Sleep apnea, unspecified: Secondary | ICD-10-CM | POA: Insufficient documentation

## 2021-10-27 DIAGNOSIS — E7800 Pure hypercholesterolemia, unspecified: Secondary | ICD-10-CM

## 2021-10-27 DIAGNOSIS — R600 Localized edema: Secondary | ICD-10-CM

## 2021-10-27 DIAGNOSIS — G4733 Obstructive sleep apnea (adult) (pediatric): Secondary | ICD-10-CM

## 2021-10-27 DIAGNOSIS — I2510 Atherosclerotic heart disease of native coronary artery without angina pectoris: Secondary | ICD-10-CM

## 2021-10-27 LAB — HEPATIC FUNCTION PANEL
ALT: 45 U/L — ABNORMAL HIGH (ref 0–44)
AST: 92 U/L — ABNORMAL HIGH (ref 15–41)
Albumin: 2.8 g/dL — ABNORMAL LOW (ref 3.5–5.0)
Alkaline Phosphatase: 127 U/L — ABNORMAL HIGH (ref 38–126)
Bilirubin, Direct: 0.6 mg/dL — ABNORMAL HIGH (ref 0.0–0.2)
Indirect Bilirubin: 1.2 mg/dL — ABNORMAL HIGH (ref 0.3–0.9)
Total Bilirubin: 1.8 mg/dL — ABNORMAL HIGH (ref 0.3–1.2)
Total Protein: 6.6 g/dL (ref 6.5–8.1)

## 2021-10-27 LAB — BASIC METABOLIC PANEL
Anion gap: 13 (ref 5–15)
BUN: 40 mg/dL — ABNORMAL HIGH (ref 8–23)
CO2: 26 mmol/L (ref 22–32)
Calcium: 9.3 mg/dL (ref 8.9–10.3)
Chloride: 99 mmol/L (ref 98–111)
Creatinine, Ser: 2.08 mg/dL — ABNORMAL HIGH (ref 0.44–1.00)
GFR, Estimated: 23 mL/min — ABNORMAL LOW (ref >60)
Glucose, Bld: 106 mg/dL — ABNORMAL HIGH (ref 70–99)
Potassium: 4.2 mmol/L (ref 3.5–5.1)
Sodium: 138 mmol/L (ref 135–145)

## 2021-10-27 NOTE — Patient Instructions (Addendum)
Continue weighing daily and call for an overnight weight gain of 3 pounds or more or a weekly weight gain of more than 5 pounds.   If you have voicemail, please make sure your mailbox is cleaned out so that we may leave a message and please make sure to listen to any voicemails.     Make sure you are only drinking 60 ounces of fluid daily. If you are eating juicer fruit, please decrease your fluid intake

## 2021-10-27 NOTE — Progress Notes (Signed)
Patient ID: [name], female    DOB: Oct 27, 1940, 81 y.o.   MRN: [id number]  HPI  April [name] is a 81 y/o female with a history of CAD, DM, hyperlipidemia, HTN, CKD, anemia, diverticulitis, endometriosis, GERD, GIB, LBBB, PAF, sleep apnea, spasmodic dysphonia and chronic heart failure.   Echo report from 08/10/21 reviewed and showed an EF of 60-65% along with mildly elevated PA pressure, mild LAE, mild MR and moderate TR.   LHC done 03/25/16 and showed: Prox RCA lesion, 80 %stenosed. A STENT PROMUS PREM MR 3.5X16 (3.8-4.1 mm) drug eluting stent was successfully placed. Post intervention, there is a 0% residual stenosis. Ost RPDA lesion, 90 %stenosed. A STENT PROMUS PREM MR 2.5X12 (2.7 mm) drug eluting stent was successfully placed. Post intervention, there is a 0% residual stenosis. __________________________________ Prox RCA to Mid RCA lesion, 50 %stenosed. Dist RCA lesion, 30 %stenosed.- Appears smooth and chronic Dist LAD lesion, 25 %stenosed - diffuse disease in the distal LAD. Nonsignificant The left ventricular systolic function is normal. The left ventricular ejection fraction is 55-65% by visual estimate. There is no mitral valve regurgitation. There is no aortic valve stenosis.  Admitted 10/17/21 due to worsening SOB along with generalized weakness. Recently had been started on metolazone. Cardiology consult obtained. Given IVF due to AKI. PT/OT evaluations done.  Discharged after 4 days. Admitted 08/08/21 due to worsening shortness of breath, weight gain and abdominal swelling. Initially given IV lasix with transition to oral diuretics. Cardiology and palliative care consults obtained. Given 1 unit PRBC's due to anemia. Hypokalemia corrected. OT/PT evaluations done. Discharged after 8 days.   She presents today for a follow-up visit with a chief complaint of moderate fatigue with minimal exertion. Describes this as chronic in nature. She has associated cough, shortness of breath,  pedal edema, weakness and generalized fine tremors along with this. She denies any difficulty sleeping, dizziness, abdominal distention, palpitations, chest pain, wheezing, chest tightness or weight gain.   Yesterday she took '60mg'$  torsemide, 2.'5mg'$  metolazone and 93mq potassium because she felt like she was retaining fluid. Had a recent admission due to AKI and was given IVF.   [facility] and eats her meals in the cafeteria. Does not add any salt to her food and the food doesn't taste salty to her. She's unsure of how much fluids she drinks.    Past Medical History:  Diagnosis Date   Anemia    CAD S/P PCI pRCA Promus DES 3.5 x 16 (4.1 mm), ostRPDA Promus DES 2.5 x 12 (2.7 mm) 03/25/2016   CAD S/P percutaneous coronary angioplasty    a. 07/2015 MV: No ischemia, EF 66%;  b. 03/2016 Inflat STEMI/PCI: LM nl, LAD 25d, RI nl, LCX nl, OM1/2 nl, RCA 80p (3.5x16 Promus Premier DES), 50p/m, 30d, RPDA 90 (2.5x12 Promu Premier DES); c. [place] RCA st[place]->Med Rx.   CHF (congestive heart failure) ([place]    CKD (chronic kidney disease), stage III ([place]    Diabetes mellitus without complication ([place]    Diastolic dysfunction    a. 10/2013 Echo: EF 55-65%, Gr1 DD; b. [place]2018 [place]: EF 55-60%, Gr2 DD, RVSP 43mg.   Diverticulitis    Dyspnea    Endometriosis    Family history of adverse reaction to anesthesia    Mother - had to stay in ICU because of breathing difficulties every time she had anesthesia   GERD (gastroesophageal reflux disease)  GI bleed    a. 06/2021 - melena-->2 u PRBCs.   Hiatal hernia    History of colon polyps 08/1994   History of Migraines    Hypercholesterolemia    Hypertension    LBBB (left bundle branch block)    Osteoporosis    PAF (paroxysmal atrial fibrillation) ([place])    a. 03/2016 Dx @ time of MI; b. CHA2DS2VASc = 5-->Eliquis.   Rheumatic fever    Sleep apnea    CPAP    Spasmodic dysphonia    ST elevation myocardial infarction (STEMI) of inferolateral wall, initial episode of care ([place]) 03/25/2016   ST elevation myocardial infarction (STEMI) of inferolateral wall, initial episode of care [facility]) 03/25/2016   Urine incontinence    Past Surgical History:  Procedure Laterality Date   ABDOMINAL HYSTERECTOMY     ovaries not removed   APPENDECTOMY     was removed during hysterectomy   Breast biopsies     x2   BREAST EXCISIONAL BIOPSY Bilateral "years ago"   neg   BREAST SURGERY Bilateral    BROW LIFT Bilateral 08/15/2019   Procedure: BLEPHAROPLASTY UPPER EYELID; W/EXCESS SKIN BLEPHAROPTOSIS REPAIR; RESECT EX;  Surgeon: Karle Starch, MD;  Location: [place];  Service: Ophthalmology;  Laterality: Bilateral;  sleep apnea   CARDIAC CATHETERIZATION  2011   moderate 40% RCA disease   CARDIAC CATHETERIZATION  01/2010   Dr. Gollan'@[facility]'$ : Only noted 40% RCA   CARDIAC CATHETERIZATION N/A 03/25/2016   Procedure: Left Heart Cath and Coronary Angiography;  Surgeon: 04/07/2016, MD;  Location: [place] [facility];  Service: Cardiovascular;  Laterality: N/A;   CARDIAC CATHETERIZATION N/A 03/25/2016   Procedure: Coronary Stent Intervention;  Surgeon: 04/07/2016, MD;  Location: [place] [facility];  Service: Cardiovascular;  Laterality: N/A;   COLONOSCOPY  2013   COLONOSCOPY WITH PROPOFOL N/A 07/11/2021   Procedure: COLONOSCOPY WITH PROPOFOL;  Surgeon: 07/24/2021, MD;  [facility];  Service: Endoscopy;  Laterality: N/A;   ESOPHAGOGASTRODUODENOSCOPY (EGD) WITH PROPOFOL N/A 03/17/2015   Procedure: ESOPHAGOGASTRODUODENOSCOPY (EGD) WITH PROPOFOL;  Surgeon: 03/30/2015, MD;  Location: [facility] ENDOSCOPY;  Service: Gastroenterology;  Laterality: N/A;   ESOPHAGOGASTRODUODENOSCOPY (EGD) WITH PROPOFOL N/A 07/10/2021   Procedure: ESOPHAGOGASTRODUODENOSCOPY (EGD) WITH PROPOFOL;  Surgeon: 16/03/2022, MD;  Location: [facility] ENDOSCOPY;  Service:  Gastroenterology;  Laterality: N/A;   GIVENS CAPSULE STUDY N/A 07/11/2021   Procedure: GIVENS CAPSULE STUDY;  Surgeon: 07/24/2021, MD;  Location: [facility] ENDOSCOPY;  Service: Endoscopy;  Laterality: N/A;   NM MYOVIEW ([place] [facility])  07/2015   No evidence ischemia or infarction. EF 66%. Low risk   TONSILLECTOMY     TRANSTHORACIC ECHOCARDIOGRAM  10/2013   Normal LV size and function. EF 55-65%. GR 1 DD. Otherwise normal.   Family History  Problem Relation Age of Onset   Arthritis Mother    Stroke Mother    Hypertension Mother    Osteoporosis Mother    Heart disease Father        MI   Heart attack Father    Stroke Brother    Heart attack Sister    Breast cancer Other        first cousin x 2   Stroke Daughter    Heart attack Daughter    Colon cancer Neg Hx    Social History   Tobacco Use   Smoking status: Never   Smokeless tobacco: Never  Substance Use Topics   Alcohol use: No  Alcohol/week: 0.0 standard drinks of alcohol   Allergies  Allergen Reactions   Penicillins Other (See Comments)    Okay to take amoxicillin/(pt does not recall what the reaction to penicillin was (57-69 years old)    Sulfa Antibiotics Rash   Prior to Admission medications   Medication Sig Start Date End Date Taking? Authorizing Provider  acetaminophen (TYLENOL) 500 MG tablet Take 500 mg by mouth daily as needed for headache (pain).   Yes [provider]  albuterol (PROVENTIL HFA;VENTOLIN HFA) 108 (90 Base) MCG/ACT inhaler Inhale 2 puffs into the lungs every 6 (six) hours as needed for wheezing or shortness of breath. 04/22/18  Yes Kasa, Maretta Bees, MD  amiodarone (PACERONE) 200 MG tablet TAKE 1 TABLET BY MOUTH DAILY 09/27/21  Yes Gollan, Kathlene November, MD  cholecalciferol (VITAMIN D3) 25 MCG (1000 UNIT) tablet Take 1,000 Units by mouth daily.   Yes [provider]  fluticasone-salmeterol (ADVAIR HFA) 230-21 MCG/ACT inhaler Inhale 2 puffs into the lungs 2 (two) times daily. 10/20/20  Yes Flora Lipps, MD  Glucosamine-Chondroit-Collagen [phone] MG CAPS Take 1 tablet by mouth in the morning and at bedtime.   Yes [provider]  metolazone (ZAROXOLYN) 2.5 MG tablet Take 1 tablet (2.5 mg total) by mouth daily as needed (Metolazone with extra 40 potassium preserved for refractory weight gain (thus only if there is no significant improovement with extra Torsemide)). 10/21/21  Yes Jennye Boroughs, MD  mupirocin ointment (BACTROBAN) 2 % Apply 1 application. topically 2 (two) times daily.   Yes [provider]  nitroGLYCERIN (NITROSTAT) 0.4 MG SL tablet Place 1 tablet (0.4 mg total) under the tongue every 5 (five) minutes as needed. 04/22/18  Yes Gollan, Kathlene November, MD  Polyethyl Glycol-Propyl Glycol (SYSTANE OP) Place 1 drop into both eyes daily as needed (dry eyes).   Yes [provider]  potassium chloride SA (KLOR-CON M) 20 MEQ tablet Take 2 tablets 2x/day 09/27/21  Yes Einar Pheasant, MD  Propylene Glycol (SYSTANE COMPLETE) 0.6 % SOLN Apply 1 drop to eye daily.   Yes [provider]  rosuvastatin (CRESTOR) 40 MG tablet TAKE 1 TABLET BY MOUTH DAILY 09/08/21  Yes Furth, Cadence H, PA-C  torsemide (DEMADEX) 20 MG tablet Take 2 tablets (40 mg total) by mouth daily. 09/08/21  Yes Furth, Cadence H, PA-C  torsemide 40 MG TABS Take 40 mg by mouth daily as needed (for 3 pound weight gain with extra potassium 40 meq). 10/21/21  Yes Jennye Boroughs, MD  traMADol (ULTRAM) 50 MG tablet Take by mouth every 6 (six) hours as needed.   Yes [provider]   Review of Systems  Constitutional:  Positive for fatigue (easily). Negative for appetite change.  HENT:  Negative for congestion, postnasal drip and sore throat.   Eyes: Negative.   Respiratory:  Positive for cough (intermittent) and shortness of breath. Negative for chest tightness and wheezing.   Cardiovascular:  Positive for leg swelling. Negative for chest pain and palpitations.  Gastrointestinal:   Negative for abdominal distention and abdominal pain.  Endocrine: Negative.   Genitourinary: Negative.   Musculoskeletal:  Negative for back pain and neck pain.  Skin: Negative.   Allergic/Immunologic: Negative.   Neurological:  Positive for tremors and weakness. Negative for dizziness and light-headedness.  Hematological:  Negative for adenopathy. Does not bruise/bleed easily.  Psychiatric/Behavioral:  Negative for dysphoric mood and sleep disturbance (sleeping with HOB elevated;wearing CPAP nightly). The patient is not nervous/anxious.    Vitals:   10/27/21  1216  BP: (!) 136/58  Pulse: 81  Resp: 16  SpO2: 98%  Weight: 175 lb 8 oz (79.6 kg)  Height: '4\' 11"'$  (1.499 m)   Wt Readings from Last 3 Encounters:  10/27/21 172 lb (78 kg)  10/27/21 175 lb 8 oz (79.6 kg)  10/17/21 170 lb (77.1 kg)   Lab Results  Component Value Date   CREATININE 2.08 (H) 10/27/2021   CREATININE 2.30 (H) 10/21/2021   CREATININE 2.28 (H) 10/20/2021    Physical Exam Vitals and nursing note reviewed. Exam conducted with a chaperone present (DIL).  Constitutional:      Appearance: She is well-developed.  HENT:     Head: Normocephalic and atraumatic.  Neck:     Vascular: No JVD.  Cardiovascular:     Rate and Rhythm: Normal rate and regular rhythm.  Pulmonary:     Effort: Pulmonary effort is normal.     Breath sounds: Normal breath sounds. No wheezing, rhonchi or rales.  Abdominal:     Palpations: Abdomen is soft.     Tenderness: There is no abdominal tenderness.  Musculoskeletal:     Cervical back: Normal range of motion and neck supple.     Right lower leg: No tenderness. Edema (2+ pitting) present.     Left lower leg: No tenderness. Edema (2+ pitting) present.  Skin:    General: Skin is warm and dry.  Neurological:     General: No focal deficit present.     Mental Status: She is alert and oriented to person, place, and time.  Psychiatric:        Mood and Affect: Mood normal.         Behavior: Behavior normal.     Assessment & Plan:  1: Acute on Chronic heart failure with preserved ejection fraction with structural changes (LAE)- - NYHA class III - mildly fluid overloaded today with increase in her home symptoms; she did take metolazone yesterday along with an additional torsemide tablet and 2 extra potassium tablets - weighing daily; reminded to call for an overnight weight gain of > 2 pounds or a weekly weight gain of > 5 pounds - weight down 17 pounds from last visit here 6 weeks ago - not adding salt and trying to eat low sodium foods; doesn't feel like the food at assisted living facility tastes salty - keep daily fluid intake to ~ 60 ounces/ daily - check BMP today as she took extra '20mg'$  torsemide and 2.'5mg'$  metolazone yesterday - wearing compression boots daily with some improvement of her edema - currently receiving pt - BNP 10/17/21 was 296.7  2: HTN- - BP mildly elevated (136/58) - saw PCP Nicki Reaper) 09/21/21; returns later today - BMP 10/21/21 reviewed and showed sodium 132, potassium 3.7, creatinine 2.3 and GFR 21 - saw nephrology Candiss Norse) 10/10/21  3: PAF- - saw cardiology Rockey Situ) 10/03/21 - currently on amiodarone  4: Anemia- - saw GI (W[facility]) 07/21/21 - Hg 10/17/21 was 10.2  5: DM- - A1c 09/21/21 was 6.3% - currently diet controlled  6: Sleep apnea- - wearing CPAP nightly - saw pulmonology Lanney Gins) 10/11/21    Medication bottles reviewed.   Return in 1 month, sooner if needed.

## 2021-10-27 NOTE — Progress Notes (Signed)
Patient ID: April [name], female   DOB: August 12, 1940, 81 y.o.   MRN: [id number]   Subjective:    Patient ID: April [name], female    DOB: 15-Mar-1941, 81 y.o.   MRN: [id number]   Patient here for hospital follow up.   Chief Complaint  Patient presents with   Shortness of [facility] follow up   .   HPI She is accompanied by her daughter-n-law. History obtained from both of them.  Admitted 10/17/21 - 10/21/21 - with worsening sob and weakness.  Work up revealed AKI and severe hypokalemia.  Potassium was repleted IV and orally.  Given IVFs.  Evaluated by PT and OT who recommended home health therapy.  Since discharge, she reports increased fatigue.  Still with persistent sob and lower extremity swelling.  She is weighing herself daily.  Is intermittently taking an extra torsemide with two extra potassium tablets when she takes.  Discussed monitoring salt intake. States she does not add salt and the food where she lives - they are not adding salt. Discussed lower extremity swelling.  Declines wraps.  Saw Darylene Price earlier today.  Met b drawn.  No changes in medication made.  States home weight - 175-176.     Past Medical History:  Diagnosis Date   Anemia    CAD S/P PCI pRCA Promus DES 3.5 x 16 (4.1 mm), ostRPDA Promus DES 2.5 x 12 (2.7 mm) 03/25/2016   CAD S/P percutaneous coronary angioplasty    a. 07/2015 MV: No ischemia, EF 66%;  b. 03/2016 Inflat STEMI/PCI: LM nl, LAD 25d, RI nl, LCX nl, OM1/2 nl, RCA 80p (3.5x16 Promus Premier DES), 50p/m, 30d, RPDA 90 (2.5x12 Promu Premier DES); c. 01/2018 Cath ([place], [place]): Patent RCA stents->Med Rx.   CHF (congestive heart failure) ([place])    CKD (chronic kidney disease), stage III ([place])    Diabetes mellitus without complication ([place])    Diastolic dysfunction    a. 10/2013 Echo: EF 55-65%, Gr1 DD; b. 01/2018 Echo ([place], [place]): EF 55-60%, Gr2 DD, RVSP 30mHg.   Diverticulitis    Dyspnea    Endometriosis     Family history of adverse reaction to anesthesia    Mother - had to stay in ICU because of breathing difficulties every time she had anesthesia   GERD (gastroesophageal reflux disease)    GI bleed    a. 06/2021 - melena-->2 u PRBCs.   Hiatal hernia    History of colon polyps 08/1994   History of Migraines    Hypercholesterolemia    Hypertension    LBBB (left bundle branch block)    Osteoporosis    PAF (paroxysmal atrial fibrillation) ([facility]    a. 03/2016 Dx @ time of MI; b. CHA2DS2VASc = 5-->Eliquis.   Rheumatic fever    Sleep apnea    CPAP   Spasmodic dysphonia    ST elevation myocardial infarction (STEMI) of inferolateral wall, initial episode of care ([facility] 03/25/2016   ST elevation myocardial infarction (STEMI) of inferolateral wall, initial episode of care ([facility] 03/25/2016   Urine incontinence    Past Surgical History:  Procedure Laterality Date   ABDOMINAL HYSTERECTOMY     ovaries not removed   APPENDECTOMY     was removed during hysterectomy   Breast biopsies     x2   BREAST EXCISIONAL BIOPSY Bilateral "years ago"   neg   BREAST SURGERY Bilateral    BROW LIFT Bilateral 08/15/2019  Procedure: BLEPHAROPLASTY UPPER EYELID; W/EXCESS SKIN BLEPHAROPTOSIS REPAIR; RESECT EX;  Surgeon: Karle Starch, MD;  Location: [place];  Service: Ophthalmology;  Laterality: Bilateral;  sleep apnea   CARDIAC CATHETERIZATION  2011   moderate 40% RCA disease   CARDIAC CATHETERIZATION  01/2010   Dr. Suzie Portela : Only noted 40% RCA   CARDIAC CATHETERIZATION N/A 03/25/2016   Procedure: Left Heart Cath and Coronary Angiography;  Surgeon: Leonie Man, MD;  Location: [place] [facility];  Service: Cardiovascular;  Laterality: N/A;   CARDIAC CATHETERIZATION N/A 03/25/2016   Procedure: Coronary Stent Intervention;  Surgeon: Leonie Man, MD;  Location: [place] [facility];  Service: Cardiovascular;  Laterality: N/A;   COLONOSCOPY  2013   COLONOSCOPY WITH PROPOFOL N/A 07/11/2021    Procedure: COLONOSCOPY WITH PROPOFOL;  Surgeon: Lucilla Lame, MD;  Location: [facility] ENDOSCOPY;  Service: Endoscopy;  Laterality: N/A;   ESOPHAGOGASTRODUODENOSCOPY (EGD) WITH PROPOFOL N/A 03/17/2015   Procedure: ESOPHAGOGASTRODUODENOSCOPY (EGD) WITH PROPOFOL;  Surgeon: Christene Lye, MD;  Location: [facility] ENDOSCOPY;  Service: Gastroenterology;  Laterality: N/A;   ESOPHAGOGASTRODUODENOSCOPY (EGD) WITH PROPOFOL N/A 07/10/2021   Procedure: ESOPHAGOGASTRODUODENOSCOPY (EGD) WITH PROPOFOL;  Surgeon: Lin Landsman, MD;  Location: [facility] ENDOSCOPY;  Service: Gastroenterology;  Laterality: N/A;   GIVENS CAPSULE STUDY N/A 07/11/2021   Procedure: GIVENS CAPSULE STUDY;  Surgeon: Lucilla Lame, MD;  Location: [facility] ENDOSCOPY;  Service: Endoscopy;  Laterality: N/A;   NM MYOVIEW ([place] [facility])  07/2015   No evidence ischemia or infarction. EF 66%. Low risk   TONSILLECTOMY     TRANSTHORACIC ECHOCARDIOGRAM  10/2013   Normal LV size and function. EF 55-65%. GR 1 DD. Otherwise normal.   Family History  Problem Relation Age of Onset   Arthritis Mother    Stroke Mother    Hypertension Mother    Osteoporosis Mother    Heart disease Father        MI   Heart attack Father    Stroke Brother    Heart attack Sister    Breast cancer Other        first cousin x 2   Stroke Daughter    Heart attack Daughter    Colon cancer Neg Hx    Social History   Socioeconomic History   Marital status: Widowed    Spouse name: Not on file   Number of children: 2   Years of education: Not on file   Highest education level: Not on file  Occupational History   Not on file  Tobacco Use   Smoking status: Never   Smokeless tobacco: Never  Vaping Use   Vaping Use: Never used  Substance and Sexual Activity   Alcohol use: No    Alcohol/week: 0.0 standard drinks of alcohol   Drug use: No   Sexual activity: Not Currently    Birth control/protection: Post-menopausal  Other Topics Concern   Not on file  Social History  Narrative   Not on file   Social Determinants of Health   Financial Resource Strain: Not on file  Food Insecurity: Not on file  Transportation Needs: Not on file  Physical Activity: Not on file  Stress: Not on file  Social Connections: Not on file     Review of Systems  Constitutional:  Positive for fatigue. Negative for appetite change.       Weighing regularly.    HENT:  Negative for congestion and sinus pressure.   Respiratory:  Negative for cough and chest tightness.  Chronic sob.    Cardiovascular:  Positive for leg swelling. Negative for chest pain and palpitations.  Gastrointestinal:  Negative for abdominal pain, diarrhea, nausea and vomiting.  Genitourinary:  Negative for difficulty urinating and dysuria.  Musculoskeletal:  Negative for joint swelling and myalgias.  Skin:  Negative for color change and rash.  Neurological:  Negative for dizziness and headaches.  Psychiatric/Behavioral:  Negative for agitation and dysphoric mood.        Objective:     BP 136/68 (BP Location: Left Arm, Patient Position: Sitting, Cuff Size: Small)   Pulse 70   Temp 98.6 F (37 C) (Temporal)   Resp 19   Wt 172 lb (78 kg)   LMP 05/01/1972   SpO2 99%   BMI 34.74 kg/m  Wt Readings from Last 3 Encounters:  10/27/21 172 lb (78 kg)  10/27/21 175 lb 8 oz (79.6 kg)  10/17/21 170 lb (77.1 kg)    Physical Exam Vitals reviewed.  Constitutional:      General: She is not in acute distress.    Appearance: Normal appearance.  HENT:     Head: Normocephalic and atraumatic.     Right Ear: External ear normal.     Left Ear: External ear normal.  Eyes:     General: No scleral icterus.       Right eye: No discharge.        Left eye: No discharge.     Conjunctiva/sclera: Conjunctivae normal.  Neck:     Thyroid: No thyromegaly.  Cardiovascular:     Rate and Rhythm: Normal rate and regular rhythm.  Pulmonary:     Effort: No respiratory distress.     Breath sounds: Normal breath  sounds. No wheezing.  Abdominal:     General: Bowel sounds are normal.     Palpations: Abdomen is soft.     Tenderness: There is no abdominal tenderness.  Musculoskeletal:        General: No swelling or tenderness.     Cervical back: Neck supple. No tenderness.     Right lower leg: Edema present.     Left lower leg: Edema present.  Lymphadenopathy:     Cervical: No cervical adenopathy.  Skin:    Findings: No erythema or rash.  Neurological:     Mental Status: She is alert.  Psychiatric:        Mood and Affect: Mood normal.        Behavior: Behavior normal.      Outpatient Encounter Medications as of 10/27/2021  Medication Sig   acetaminophen (TYLENOL) 500 MG tablet Take 500 mg by mouth daily as needed for headache (pain).   albuterol (PROVENTIL HFA;VENTOLIN HFA) 108 (90 Base) MCG/ACT inhaler Inhale 2 puffs into the lungs every 6 (six) hours as needed for wheezing or shortness of breath.   amiodarone (PACERONE) 200 MG tablet TAKE 1 TABLET BY MOUTH DAILY   cholecalciferol (VITAMIN D3) 25 MCG (1000 UNIT) tablet Take 1,000 Units by mouth daily.   fluticasone-salmeterol (ADVAIR HFA) 230-21 MCG/ACT inhaler Inhale 2 puffs into the lungs 2 (two) times daily.   Glucosamine-Chondroit-Collagen 250-200-116.67 MG CAPS Take 1 tablet by mouth in the morning and at bedtime.   metolazone (ZAROXOLYN) 2.5 MG tablet Take 1 tablet (2.5 mg total) by mouth daily as needed (Metolazone with extra 40 potassium preserved for refractory weight gain (thus only if there is no significant improovement with extra Torsemide)).   mupirocin ointment (BACTROBAN) 2 % Apply 1 application. topically 2 (two)  times daily.   nitroGLYCERIN (NITROSTAT) 0.4 MG SL tablet Place 1 tablet (0.4 mg total) under the tongue every 5 (five) minutes as needed.   Polyethyl Glycol-Propyl Glycol (SYSTANE OP) Place 1 drop into both eyes daily as needed (dry eyes).   potassium chloride SA (KLOR-CON M) 20 MEQ tablet Take 2 tablets 2x/day    Propylene Glycol (SYSTANE COMPLETE) 0.6 % SOLN Apply 1 drop to eye daily.   rosuvastatin (CRESTOR) 40 MG tablet TAKE 1 TABLET BY MOUTH DAILY   torsemide (DEMADEX) 20 MG tablet Take 2 tablets (40 mg total) by mouth daily.   torsemide 40 MG TABS Take 40 mg by mouth daily as needed (for 3 pound weight gain with extra potassium 40 meq).   traMADol (ULTRAM) 50 MG tablet Take by mouth every 6 (six) hours as needed.   No facility-administered encounter medications on file as of 10/27/2021.     Lab Results  Component Value Date   WBC 10.5 10/17/2021   HGB 10.2 (L) 10/17/2021   HCT 31.0 (L) 10/17/2021   PLT 191 10/17/2021   GLUCOSE 106 (H) 10/27/2021   CHOL 129 01/20/2021   TRIG 80.0 01/20/2021   HDL 50.90 01/20/2021   LDLCALC 62 01/20/2021   ALT 45 (H) 10/27/2021   AST 92 (H) 10/27/2021   NA 138 10/27/2021   K 4.2 10/27/2021   CL 99 10/27/2021   CREATININE 2.08 (H) 10/27/2021   BUN 40 (H) 10/27/2021   CO2 26 10/27/2021   TSH 1.33 09/21/2021   INR 0.99 03/25/2016   HGBA1C 6.3 09/21/2021   MICROALBUR <0.7 04/29/2020       Assessment & Plan:   Problem List Items Addressed This Visit     Abnormal liver function tests - Primary    Reviewed labs.  Liver function tests elevated.  Recheck liver panel today.  Called to add to blood already drawn.       Relevant Orders   Hepatic function panel (Completed)   Aortic atherosclerosis ([place])    Continue crestor.       Chronic heart failure with preserved ejection fraction ([place])    Recently admitted with increased sob and hypokalemia.  She is weighing herself daily.  Adjusting doses of diuretic pending her weight.  Continues on torsemide and metalozone as directed.  Potassium supplements.  Met b checked today.  F/u on results.  Avoid increased salt intake.        Chronic obstructive pulmonary disease, unspecified COPD type ([place])    Breathing overall better with diuresis.  No increased cough.  Follow.        Coronary artery disease  involving native coronary artery of native heart with unstable angina pectoris (HCC)    No chest pain.  Off eliquis due to GI bleed.  Breathing is some better.  With chronic sob.  Continues torsemide and metolazone as directed.  F/u met b results today.        Diabetes mellitus ([place])    Low carb diet and exercise.  Follow met b and a1c.       GERD (gastroesophageal reflux disease)    Continue protonix.       Hypokalemia    Has had problems with extremely low potassium.  Adjusting potassium dose pending amount of diuretic taking.  F/u with met b today.        Iron deficiency anemia    Recent admission for GI bleed - anemia.  Transfused.  Follow cbc. No further bleeding.  No  obvious source identified.        Lower extremity edema    Bilateral lower extremity swelling as outlined.  Has improved some.  Wanted a break from [place] boots.  Continue leg elevation.  Diuretics as directed.  Follow.       OSA on CPAP    Continue cpap.       Paroxysmal atrial fibrillation (HCC)    Has been on eliquis and amiodarone.  Off eliquis recently secondary to GI bleed.  Followed by cardiology. Rated controlled.       Thrombophilia (HCC)    Afib.  Off eliquis due to bleeding.  Keep f/u with cardiology.       Weakness    Weakness and fatigue as outlined.  Discussed.  Recommend PT - home health.  Make sure arranged.  Follow.       Essential hypertension, benign (Chronic)    Blood pressure as outlined.  Continues on torsemide and metolazone.  Follow pressures.  Follow metabolic panel.       Hypercholesterolemia (Chronic)    On crestor.  Low cholesterol diet and exercise.  Follow lipid panel and liver function tests.          Einar Pheasant, MD

## 2021-10-28 ENCOUNTER — Encounter: Payer: Self-pay | Admitting: Family

## 2021-10-28 ENCOUNTER — Other Ambulatory Visit: Payer: Self-pay

## 2021-10-28 DIAGNOSIS — I252 Old myocardial infarction: Secondary | ICD-10-CM | POA: Diagnosis not present

## 2021-10-28 DIAGNOSIS — D631 Anemia in chronic kidney disease: Secondary | ICD-10-CM | POA: Diagnosis not present

## 2021-10-28 DIAGNOSIS — R7989 Other specified abnormal findings of blood chemistry: Secondary | ICD-10-CM

## 2021-10-28 DIAGNOSIS — I2510 Atherosclerotic heart disease of native coronary artery without angina pectoris: Secondary | ICD-10-CM | POA: Diagnosis not present

## 2021-10-28 DIAGNOSIS — I130 Hypertensive heart and chronic kidney disease with heart failure and stage 1 through stage 4 chronic kidney disease, or unspecified chronic kidney disease: Secondary | ICD-10-CM | POA: Diagnosis not present

## 2021-10-28 DIAGNOSIS — N1832 Chronic kidney disease, stage 3b: Secondary | ICD-10-CM | POA: Diagnosis not present

## 2021-10-28 DIAGNOSIS — I5032 Chronic diastolic (congestive) heart failure: Secondary | ICD-10-CM | POA: Diagnosis not present

## 2021-10-29 ENCOUNTER — Encounter: Payer: Self-pay | Admitting: Internal Medicine

## 2021-10-29 ENCOUNTER — Other Ambulatory Visit: Payer: Self-pay | Admitting: Internal Medicine

## 2021-10-29 DIAGNOSIS — R531 Weakness: Secondary | ICD-10-CM | POA: Insufficient documentation

## 2021-10-29 NOTE — Assessment & Plan Note (Signed)
Continue cpap.  

## 2021-10-29 NOTE — Assessment & Plan Note (Signed)
Continue crestor 

## 2021-10-29 NOTE — Assessment & Plan Note (Signed)
Bilateral lower extremity swelling as outlined.  Has improved some.  Wanted a break from [place] boots.  Continue leg elevation.  Diuretics as directed.  Follow.

## 2021-10-29 NOTE — Assessment & Plan Note (Signed)
Continue protonix  

## 2021-10-29 NOTE — Assessment & Plan Note (Signed)
Weakness and fatigue as outlined.  Discussed.  Recommend PT - home health.  Make sure arranged.  Follow.

## 2021-10-29 NOTE — Assessment & Plan Note (Signed)
Blood pressure as outlined.  Continues on torsemide and metolazone.  Follow pressures.  Follow metabolic panel.

## 2021-10-29 NOTE — Assessment & Plan Note (Signed)
Recent admission for GI bleed - anemia.  Transfused.  Follow cbc. No further bleeding.  No obvious source identified.

## 2021-10-29 NOTE — Assessment & Plan Note (Signed)
Reviewed labs.  Liver function tests elevated.  Recheck liver panel today.  Called to add to blood already drawn.

## 2021-10-29 NOTE — Assessment & Plan Note (Signed)
Afib.  Off eliquis due to bleeding.  Keep f/u with cardiology.

## 2021-10-29 NOTE — Assessment & Plan Note (Signed)
Has had problems with extremely low potassium.  Adjusting potassium dose pending amount of diuretic taking.  F/u with met b today.

## 2021-10-29 NOTE — Assessment & Plan Note (Signed)
No chest pain.  Off eliquis due to GI bleed.  Breathing is some better.  With chronic sob.  Continues torsemide and metolazone as directed.  F/u met b results today.   

## 2021-10-29 NOTE — Assessment & Plan Note (Signed)
Has been on eliquis and amiodarone.  Off eliquis recently secondary to GI bleed.  Followed by cardiology. Rated controlled.

## 2021-10-29 NOTE — Assessment & Plan Note (Signed)
Breathing overall better with diuresis.  No increased cough.  Follow.

## 2021-10-29 NOTE — Assessment & Plan Note (Signed)
Recently admitted with increased sob and hypokalemia.  She is weighing herself daily.  Adjusting doses of diuretic pending her weight.  Continues on torsemide and metalozone as directed.  Potassium supplements.  Met b checked today.  F/u on results.  Avoid increased salt intake.

## 2021-10-29 NOTE — Assessment & Plan Note (Signed)
On crestor.  Low cholesterol diet and exercise.  Follow lipid panel and liver function tests.   

## 2021-10-29 NOTE — Assessment & Plan Note (Signed)
Low carb diet and exercise.  Follow met b and a1c.  

## 2021-10-31 DIAGNOSIS — I252 Old myocardial infarction: Secondary | ICD-10-CM | POA: Diagnosis not present

## 2021-10-31 DIAGNOSIS — I5032 Chronic diastolic (congestive) heart failure: Secondary | ICD-10-CM | POA: Diagnosis not present

## 2021-10-31 DIAGNOSIS — I130 Hypertensive heart and chronic kidney disease with heart failure and stage 1 through stage 4 chronic kidney disease, or unspecified chronic kidney disease: Secondary | ICD-10-CM | POA: Diagnosis not present

## 2021-10-31 DIAGNOSIS — I2510 Atherosclerotic heart disease of native coronary artery without angina pectoris: Secondary | ICD-10-CM | POA: Diagnosis not present

## 2021-10-31 DIAGNOSIS — D631 Anemia in chronic kidney disease: Secondary | ICD-10-CM | POA: Diagnosis not present

## 2021-10-31 DIAGNOSIS — N1832 Chronic kidney disease, stage 3b: Secondary | ICD-10-CM | POA: Diagnosis not present

## 2021-11-02 DIAGNOSIS — N1832 Chronic kidney disease, stage 3b: Secondary | ICD-10-CM | POA: Diagnosis not present

## 2021-11-02 DIAGNOSIS — D631 Anemia in chronic kidney disease: Secondary | ICD-10-CM | POA: Diagnosis not present

## 2021-11-02 DIAGNOSIS — I252 Old myocardial infarction: Secondary | ICD-10-CM | POA: Diagnosis not present

## 2021-11-02 DIAGNOSIS — I5032 Chronic diastolic (congestive) heart failure: Secondary | ICD-10-CM | POA: Diagnosis not present

## 2021-11-02 DIAGNOSIS — I2510 Atherosclerotic heart disease of native coronary artery without angina pectoris: Secondary | ICD-10-CM | POA: Diagnosis not present

## 2021-11-02 DIAGNOSIS — I130 Hypertensive heart and chronic kidney disease with heart failure and stage 1 through stage 4 chronic kidney disease, or unspecified chronic kidney disease: Secondary | ICD-10-CM | POA: Diagnosis not present

## 2021-11-07 ENCOUNTER — Encounter: Payer: Self-pay | Admitting: Family

## 2021-11-07 ENCOUNTER — Ambulatory Visit: Payer: Medicare Other

## 2021-11-07 ENCOUNTER — Ambulatory Visit (HOSPITAL_BASED_OUTPATIENT_CLINIC_OR_DEPARTMENT_OTHER): Payer: Medicare Other | Admitting: Family

## 2021-11-07 ENCOUNTER — Other Ambulatory Visit
Admission: RE | Admit: 2021-11-07 | Discharge: 2021-11-07 | Disposition: A | Discharge status: Home / Self Care | Payer: Medicare Other | Source: Ambulatory Visit | Attending: Family | Admitting: Family

## 2021-11-07 ENCOUNTER — Telehealth: Payer: Self-pay | Admitting: Internal Medicine

## 2021-11-07 VITALS — BP 123/53 | HR 72 | Resp 18 | Ht 59.0 in | Wt 169.5 lb

## 2021-11-07 DIAGNOSIS — G4730 Sleep apnea, unspecified: Secondary | ICD-10-CM | POA: Insufficient documentation

## 2021-11-07 DIAGNOSIS — E785 Hyperlipidemia, unspecified: Secondary | ICD-10-CM | POA: Insufficient documentation

## 2021-11-07 DIAGNOSIS — N183 Chronic kidney disease, stage 3 unspecified: Secondary | ICD-10-CM | POA: Insufficient documentation

## 2021-11-07 DIAGNOSIS — E1122 Type 2 diabetes mellitus with diabetic chronic kidney disease: Secondary | ICD-10-CM

## 2021-11-07 DIAGNOSIS — Z79899 Other long term (current) drug therapy: Secondary | ICD-10-CM | POA: Insufficient documentation

## 2021-11-07 DIAGNOSIS — R059 Cough, unspecified: Secondary | ICD-10-CM | POA: Diagnosis not present

## 2021-11-07 DIAGNOSIS — D649 Anemia, unspecified: Secondary | ICD-10-CM

## 2021-11-07 DIAGNOSIS — R0602 Shortness of breath: Secondary | ICD-10-CM | POA: Diagnosis not present

## 2021-11-07 DIAGNOSIS — Z9861 Coronary angioplasty status: Secondary | ICD-10-CM

## 2021-11-07 DIAGNOSIS — Z9989 Dependence on other enabling machines and devices: Secondary | ICD-10-CM | POA: Insufficient documentation

## 2021-11-07 DIAGNOSIS — I130 Hypertensive heart and chronic kidney disease with heart failure and stage 1 through stage 4 chronic kidney disease, or unspecified chronic kidney disease: Secondary | ICD-10-CM | POA: Insufficient documentation

## 2021-11-07 DIAGNOSIS — I2510 Atherosclerotic heart disease of native coronary artery without angina pectoris: Secondary | ICD-10-CM | POA: Insufficient documentation

## 2021-11-07 DIAGNOSIS — I480 Paroxysmal atrial fibrillation: Secondary | ICD-10-CM | POA: Insufficient documentation

## 2021-11-07 DIAGNOSIS — I5022 Chronic systolic (congestive) heart failure: Secondary | ICD-10-CM

## 2021-11-07 DIAGNOSIS — I10 Essential (primary) hypertension: Secondary | ICD-10-CM

## 2021-11-07 DIAGNOSIS — I5032 Chronic diastolic (congestive) heart failure: Secondary | ICD-10-CM | POA: Insufficient documentation

## 2021-11-07 DIAGNOSIS — D631 Anemia in chronic kidney disease: Secondary | ICD-10-CM | POA: Insufficient documentation

## 2021-11-07 DIAGNOSIS — R251 Tremor, unspecified: Secondary | ICD-10-CM | POA: Diagnosis not present

## 2021-11-07 DIAGNOSIS — G4733 Obstructive sleep apnea (adult) (pediatric): Secondary | ICD-10-CM | POA: Diagnosis not present

## 2021-11-07 DIAGNOSIS — N184 Chronic kidney disease, stage 4 (severe): Secondary | ICD-10-CM

## 2021-11-07 LAB — COMPREHENSIVE METABOLIC PANEL
ALT: 36 U/L (ref 0–44)
AST: 83 U/L — ABNORMAL HIGH (ref 15–41)
Albumin: 2.4 g/dL — ABNORMAL LOW (ref 3.5–5.0)
Alkaline Phosphatase: 125 U/L (ref 38–126)
Anion gap: 11 (ref 5–15)
BUN: 31 mg/dL — ABNORMAL HIGH (ref 8–23)
CO2: 31 mmol/L (ref 22–32)
Calcium: 8.6 mg/dL — ABNORMAL LOW (ref 8.9–10.3)
Chloride: 93 mmol/L — ABNORMAL LOW (ref 98–111)
Creatinine, Ser: 2.47 mg/dL — ABNORMAL HIGH (ref 0.44–1.00)
GFR, Estimated: 19 mL/min — ABNORMAL LOW (ref >60)
Glucose, Bld: 133 mg/dL — ABNORMAL HIGH (ref 70–99)
Potassium: 3.2 mmol/L — ABNORMAL LOW (ref 3.5–5.1)
Sodium: 135 mmol/L (ref 135–145)
Total Bilirubin: 1.6 mg/dL — ABNORMAL HIGH (ref 0.3–1.2)
Total Protein: 6.4 g/dL — ABNORMAL LOW (ref 6.5–8.1)

## 2021-11-07 LAB — CBC
HCT: 29.9 % — ABNORMAL LOW (ref 36.0–46.0)
Hemoglobin: 9.9 g/dL — ABNORMAL LOW (ref 12.0–15.0)
MCH: 28.8 pg (ref 26.0–34.0)
MCHC: 33.1 g/dL (ref 30.0–36.0)
MCV: 86.9 fL (ref 80.0–100.0)
Platelets: 189 10*3/uL (ref 150–400)
RBC: 3.44 MIL/uL — ABNORMAL LOW (ref 3.87–5.11)
RDW: 20.4 % — ABNORMAL HIGH (ref 11.5–15.5)
WBC: 10.9 10*3/uL — ABNORMAL HIGH (ref 4.0–10.5)
nRBC: 0 % (ref 0.0–0.2)

## 2021-11-07 NOTE — Telephone Encounter (Signed)
Pt again experiencing weakness in arms and legs. Would like order faxed to [facility] to check hgb.  Ok to order and fax?

## 2021-11-07 NOTE — Progress Notes (Signed)
Patient ID: April [name], female    DOB: 04-11-1941, 81 y.o.   MRN: [id number]  HPI  [name] is a 81 y/o female with a history of CAD, DM, hyperlipidemia, HTN, CKD, anemia, diverticulitis, endometriosis, GERD, GIB, LBBB, PAF, sleep apnea, spasmodic dysphonia and chronic heart failure.   Echo report from 08/10/21 reviewed and showed an EF of 60-65% along with mildly elevated PA pressure, mild LAE, mild MR and moderate TR.   LHC done 03/25/16 and showed: Prox RCA lesion, 80 %stenosed. A STENT PROMUS PREM MR 3.5X16 (3.8-4.1 mm) drug eluting stent was successfully placed. Post intervention, there is a 0% residual stenosis. Ost RPDA lesion, 90 %stenosed. A STENT PROMUS PREM MR 2.5X12 (2.7 mm) drug eluting stent was successfully placed. Post intervention, there is a 0% residual stenosis. __________________________________ Prox RCA to Mid RCA lesion, 50 %stenosed. Dist RCA lesion, 30 %stenosed.- Appears smooth and chronic Dist LAD lesion, 25 %stenosed - diffuse disease in the distal LAD. Nonsignificant The left ventricular systolic function is normal. The left ventricular ejection fraction is 55-65% by visual estimate. There is no mitral valve regurgitation. There is no aortic valve stenosis.  Admitted 10/17/21 due to worsening SOB along with generalized weakness. Recently had been started on metolazone. Cardiology consult obtained. Given IVF due to AKI. PT/OT evaluations done.  Discharged after 4 days. Admitted 08/08/21 due to worsening shortness of breath, weight gain and abdominal swelling. Initially given IV lasix with transition to oral diuretics. Cardiology and palliative care consults obtained. Given 1 unit PRBC's due to anemia. Hypokalemia corrected. OT/PT evaluations done. Discharged after 8 days.   She presents today for an urgent visit with a chief complaint of moderate fatigue with minimal exertion. Describes this as chronic in nature. She has associated cough, shortness of breath, pedal  edema, tremors and weakness along with this. She denies any wheezing, chest pain, palpitations, abdominal distention, difficulty sleeping, dizziness or weight gain.   She says that she just "doesn't feel good" and doesn't know what's wrong. She took a dose of metolazone with 2 extra potassium tablets 2 days ago and has felt better since then. She wasn't sure if she should continue this or not.   Currently living at [facility] and eats her meals in the cafeteria. Does not add any salt to her food and the food doesn't taste salty to her. She's unsure of how much fluids she drinks.    Past Medical History:  Diagnosis Date   Anemia    CAD S/P PCI pRCA Promus DES 3.5 x 16 (4.1 mm), ostRPDA Promus DES 2.5 x 12 (2.7 mm) 03/25/2016   CAD S/P percutaneous coronary angioplasty    a. 07/2015 MV: No ischemia, EF 66%;  b. 03/2016 Inflat STEMI/PCI: LM nl, LAD 25d, RI nl, LCX nl, OM1/2 nl, RCA 80p (3.5x16 Promus Premier DES), 50p/m, 30d, RPDA 90 (2.5x12 Promu Premier DES); c. 01/2018 Cath ([place], [place]): Patent RCA stents->Med Rx.   CHF (congestive heart failure) ([place])    CKD (chronic kidney disease), stage III ([place])    Diabetes mellitus without complication ([place])    Diastolic dysfunction    a. 10/2013 Echo: EF 55-65%, Gr1 DD; b. 01/2018 Echo ([place], [place]): EF 55-60%, Gr2 DD, RVSP 55mHg.   Diverticulitis    Dyspnea    Endometriosis    Family history of adverse reaction to anesthesia    Mother - had to stay in ICU because of breathing difficulties every time she  had anesthesia   GERD (gastroesophageal reflux disease)    GI bleed    a. 06/2021 - melena-->2 u PRBCs.   Hiatal hernia    History of colon polyps 08/1994   History of Migraines    Hypercholesterolemia    Hypertension    LBBB (left bundle branch block)    Osteoporosis    PAF (paroxysmal atrial fibrillation) ([place])    a. 03/2016 Dx @ time of MI; b. CHA2DS2VASc = 5-->Eliquis.   Rheumatic fever    Sleep apnea     CPAP   Spasmodic dysphonia    ST elevation myocardial infarction (STEMI) of inferolateral wall, initial episode of care ([place]) 03/25/2016   ST elevation myocardial infarction (STEMI) of inferolateral wall, initial episode of care [facility]) 03/25/2016   Urine incontinence    Past Surgical History:  Procedure Laterality Date   ABDOMINAL HYSTERECTOMY     ovaries not removed   APPENDECTOMY     was removed during hysterectomy   Breast biopsies     x2   BREAST EXCISIONAL BIOPSY Bilateral "years ago"   neg   BREAST SURGERY Bilateral    BROW LIFT Bilateral 08/15/2019   Procedure: BLEPHAROPLASTY UPPER EYELID; W/EXCESS SKIN BLEPHAROPTOSIS REPAIR; RESECT EX;  Surgeon: Karle Starch, MD;  Location: [place];  Service: Ophthalmology;  Laterality: Bilateral;  sleep apnea   CARDIAC CATHETERIZATION  2011   moderate 40% RCA disease   CARDIAC CATHETERIZATION  01/2010   Dr. Gollan'@[facility]'$ : Only noted 40% RCA   CARDIAC CATHETERIZATION N/A 03/25/2016   Procedure: Left Heart Cath and Coronary Angiography;  Surgeon: 04/07/2016, MD;  Location: [place] CV LAB;  Service: Cardiovascular;  Laterality: N/A;   CARDIAC CATHETERIZATION N/A 03/25/2016   Procedure: Coronary Stent Intervention;  Surgeon: 04/07/2016, MD;  Location: [place] [facility];  Service: Cardiovascular;  Laterality: N/A;   COLONOSCOPY  2013   COLONOSCOPY WITH PROPOFOL N/A 07/11/2021   Procedure: COLONOSCOPY WITH PROPOFOL;  Surgeon: 07/24/2021, MD;  Location: [facility] ENDOSCOPY;  Service: Endoscopy;  Laterality: N/A;   ESOPHAGOGASTRODUODENOSCOPY (EGD) WITH PROPOFOL N/A 03/17/2015   Procedure: ESOPHAGOGASTRODUODENOSCOPY (EGD) WITH PROPOFOL;  Surgeon: 03/30/2015, MD;  Location: [facility] ENDOSCOPY;  Service: Gastroenterology;  Laterality: N/A;   ESOPHAGOGASTRODUODENOSCOPY (EGD) WITH PROPOFOL N/A 07/10/2021   Procedure: ESOPHAGOGASTRODUODENOSCOPY (EGD) WITH PROPOFOL;  Surgeon: 16/03/2022, MD;  Location: [facility]COPY;   Service: Gastroenterology;  Laterality: N/A;   GIVENS CAPSULE STUDY N/A 07/11/2021   Procedure: GIVENS CAPSULE STUDY;  Surgeon: 07/24/2021, MD;  Location: [facility] ENDOSCOPY;  Service: Endoscopy;  Laterality: N/A;   NM MYOVIEW ([place] [facility])  07/2015   No evidence ischemia or infarction. EF 66%. Low risk   TONSILLECTOMY     TRANSTHORACIC ECHOCARDIOGRAM  10/2013   Normal LV size and function. EF 55-65%. GR 1 DD. Otherwise normal.   Family History  Problem Relation Age of Onset   Arthritis Mother    Stroke Mother    Hypertension Mother    Osteoporosis Mother    Heart disease Father        MI   Heart attack Father    Stroke Brother    Heart attack Sister    Breast cancer Other        first cousin x 2   Stroke Daughter    Heart attack Daughter    Colon cancer Neg Hx    Social History   Tobacco Use   Smoking status: Never   Smokeless tobacco:  Never  Substance Use Topics   Alcohol use: No    Alcohol/week: 0.0 standard drinks of alcohol   Allergies  Allergen Reactions   Penicillins Other (See Comments)    Okay to take amoxicillin/(pt does not recall what the reaction to penicillin was (86-39 years old)    Sulfa Antibiotics Rash   Prior to Admission medications   Medication Sig Start Date End Date Taking? Authorizing Provider  acetaminophen (TYLENOL) 500 MG tablet Take 500 mg by mouth daily as needed for headache (pain).   Yes [provider]  albuterol (PROVENTIL HFA;VENTOLIN HFA) 108 (90 Base) MCG/ACT inhaler Inhale 2 puffs into the lungs every 6 (six) hours as needed for wheezing or shortness of breath. 04/22/18  Yes Kasa, Maretta Bees, MD  amiodarone (PACERONE) 200 MG tablet TAKE 1 TABLET BY MOUTH DAILY 09/27/21  Yes Gollan, Kathlene November, MD  cholecalciferol (VITAMIN D3) 25 MCG (1000 UNIT) tablet Take 1,000 Units by mouth daily.   Yes [provider]  fluticasone-salmeterol (ADVAIR HFA) 230-21 MCG/ACT inhaler Inhale 2 puffs into the lungs 2 (two) times daily. 10/20/20  Yes  Flora Lipps, MD  Glucosamine-Chondroit-Collagen [phone] MG CAPS Take 1 tablet by mouth in the morning and at bedtime.   Yes [provider]  metolazone (ZAROXOLYN) 2.5 MG tablet Take 1 tablet (2.5 mg total) by mouth daily as needed (Metolazone with extra 40 potassium preserved for refractory weight gain (thus only if there is no significant improovement with extra Torsemide)). 10/21/21  Yes Jennye Boroughs, MD  mupirocin ointment (BACTROBAN) 2 % Apply 1 application. topically 2 (two) times daily.   Yes [provider]  nitroGLYCERIN (NITROSTAT) 0.4 MG SL tablet Place 1 tablet (0.4 mg total) under the tongue every 5 (five) minutes as needed. 04/22/18  Yes Gollan, Kathlene November, MD  Polyethyl Glycol-Propyl Glycol (SYSTANE OP) Place 1 drop into both eyes daily as needed (dry eyes).   Yes [provider]  potassium chloride SA (KLOR-CON M) 20 MEQ tablet TAKE 1 TABLET BY MOUTH TWICE A DAY. 10/31/21  Yes Einar Pheasant, MD  Propylene Glycol (SYSTANE COMPLETE) 0.6 % SOLN Apply 1 drop to eye daily.   Yes [provider]  rosuvastatin (CRESTOR) 40 MG tablet TAKE 1 TABLET BY MOUTH DAILY 09/08/21  Yes Furth, Cadence H, PA-C  torsemide (DEMADEX) 20 MG tablet Take 2 tablets (40 mg total) by mouth daily. 09/08/21  Yes Furth, Cadence H, PA-C  torsemide 40 MG TABS Take 40 mg by mouth daily as needed (for 3 pound weight gain with extra potassium 40 meq). 10/21/21  Yes Jennye Boroughs, MD  traMADol (ULTRAM) 50 MG tablet Take by mouth every 6 (six) hours as needed. Patient not taking: Reported on 11/07/2021    [provider]    Review of Systems  Constitutional:  Positive for appetite change (decreased) and fatigue (easily).  HENT:  Negative for congestion, postnasal drip and sore throat.   Eyes: Negative.   Respiratory:  Positive for cough (intermittent) and shortness of breath. Negative for chest tightness and wheezing.   Cardiovascular:  Positive for leg swelling.  Negative for chest pain and palpitations.  Gastrointestinal:  Negative for abdominal distention and abdominal pain.  Endocrine: Negative.   Genitourinary: Negative.   Musculoskeletal:  Negative for back pain and neck pain.  Skin: Negative.   Allergic/Immunologic: Negative.   Neurological:  Positive for tremors and weakness. Negative for dizziness and light-headedness.  Hematological:  Negative for adenopathy. Does not bruise/bleed easily.  Psychiatric/Behavioral:  Negative  for dysphoric mood and sleep disturbance (sleeping with HOB elevated;wearing CPAP nightly). The patient is not nervous/anxious.    Vitals:   11/07/21 1422  BP: (!) 123/53  Pulse: 72  Resp: 18  SpO2: 100%  Weight: 169 lb 8 oz (76.9 kg)  Height: '4\' 11"'$  (1.499 m)   Wt Readings from Last 3 Encounters:  11/07/21 169 lb 8 oz (76.9 kg)  10/27/21 172 lb (78 kg)  10/27/21 175 lb 8 oz (79.6 kg)   Lab Results  Component Value Date   CREATININE 2.47 (H) 11/07/2021   CREATININE 2.08 (H) 10/27/2021   CREATININE 2.30 (H) 10/21/2021   Physical Exam Vitals and nursing note reviewed. Exam conducted with a chaperone present (friend).  Constitutional:      Appearance: She is well-developed.  HENT:     Head: Normocephalic and atraumatic.  Neck:     Vascular: No JVD.  Cardiovascular:     Rate and Rhythm: Normal rate and regular rhythm.  Pulmonary:     Effort: Pulmonary effort is normal.     Breath sounds: Normal breath sounds. No wheezing, rhonchi or rales.  Abdominal:     Palpations: Abdomen is soft.     Tenderness: There is no abdominal tenderness.  Musculoskeletal:        General: No tenderness.     Cervical back: Normal range of motion and neck supple.     Right lower leg: No tenderness. Edema (2+ pitting) present.     Left lower leg: No tenderness. Edema (2+ pitting) present.  Skin:    General: Skin is warm and dry.  Neurological:     General: No focal deficit present.     Mental Status: She is alert and  oriented to person, place, and time.  Psychiatric:        Mood and Affect: Mood normal.        Behavior: Behavior normal.     Assessment & Plan:  1: Chronic heart failure with preserved ejection fraction with structural changes (LAE)- - NYHA class III - euvolemic today - weighing daily; reminded to call for an overnight weight gain of > 2 pounds or a weekly weight gain of > 5 pounds - weight down 6 pounds from last visit here 10 days ago - not adding salt and trying to eat low sodium foods; doesn't feel like the food at assisted living facility tastes salty - she took 2.'5mg'$  metolazone with 44mq potassium 2 days ago and has felt better but wasn't sure if she could continue this or not - check CMP today - keep daily fluid intake to ~ 60 ounces/ daily - wearing compression boots daily with some improvement of her edema - waiting to get thigh high compression socks - currently receiving pt - BNP 10/17/21 was 296.7  2: HTN- - BP looks good (123/53) - saw PCP (Nicki Reaper 10/27/21 - BMP 10/27/21 reviewed and showed sodium 138, potassium 4.2, creatinine 2.08 and GFR 23 - will check CMP today - saw nephrology (Candiss Norse 10/10/21  3: PAF- - saw cardiology (Rockey Situ 10/03/21 - currently on amiodarone  4: Anemia- - saw GI ([facility]) 07/21/21 - Hg 10/17/21 was 10.2 - check CBC today  5: DM- - A1c 09/21/21 was 6.3% - currently diet controlled  6: Sleep apnea- - wearing CPAP nightly - saw pulmonology (Lanney Gins 10/11/21    Medication list reviewed.   Return in 2 weeks, sooner if needed.

## 2021-11-07 NOTE — Telephone Encounter (Signed)
Patient states she would like to give [place] the fax number to fax the orders for her labs at [facility] at [place].  Patient states she is experiencing weakness in her arms and legs.  I transferred call to Access Nurse.  Fax:  [phone]

## 2021-11-07 NOTE — Telephone Encounter (Signed)
Pt called wanting to know if she need blood work done if so they can do it at [place]

## 2021-11-07 NOTE — Patient Instructions (Signed)
Continue weighing daily and call for an overnight weight gain of 3 pounds or more or a weekly weight gain of more than 5 pounds.   If you have voicemail, please make sure your mailbox is cleaned out so that we may leave a message and please make sure to listen to any voicemails.     

## 2021-11-07 NOTE — Telephone Encounter (Signed)
Lm for pt to cb on cell and home #

## 2021-11-07 NOTE — Telephone Encounter (Signed)
Need to confirm with pt if any other symptoms.  Any worsening sob?  Confirm she is weighing herself daily.  Any new symptoms?  Ok to order cbc, met b and liver panel.  Per chart review, scheduled for abdominal ultrasound tomorrow.

## 2021-11-08 ENCOUNTER — Telehealth: Payer: Self-pay

## 2021-11-08 ENCOUNTER — Other Ambulatory Visit: Payer: Self-pay

## 2021-11-08 ENCOUNTER — Ambulatory Visit
Admission: RE | Admit: 2021-11-08 | Discharge: 2021-11-08 | Disposition: A | Discharge status: Home / Self Care | Payer: Medicare Other | Source: Ambulatory Visit | Attending: Internal Medicine | Admitting: Internal Medicine

## 2021-11-08 DIAGNOSIS — I130 Hypertensive heart and chronic kidney disease with heart failure and stage 1 through stage 4 chronic kidney disease, or unspecified chronic kidney disease: Secondary | ICD-10-CM | POA: Diagnosis not present

## 2021-11-08 DIAGNOSIS — R7989 Other specified abnormal findings of blood chemistry: Secondary | ICD-10-CM | POA: Diagnosis not present

## 2021-11-08 DIAGNOSIS — D631 Anemia in chronic kidney disease: Secondary | ICD-10-CM | POA: Diagnosis not present

## 2021-11-08 DIAGNOSIS — I2510 Atherosclerotic heart disease of native coronary artery without angina pectoris: Secondary | ICD-10-CM | POA: Diagnosis not present

## 2021-11-08 DIAGNOSIS — D649 Anemia, unspecified: Secondary | ICD-10-CM

## 2021-11-08 DIAGNOSIS — N1832 Chronic kidney disease, stage 3b: Secondary | ICD-10-CM | POA: Diagnosis not present

## 2021-11-08 DIAGNOSIS — I5032 Chronic diastolic (congestive) heart failure: Secondary | ICD-10-CM | POA: Diagnosis not present

## 2021-11-08 DIAGNOSIS — I252 Old myocardial infarction: Secondary | ICD-10-CM | POA: Diagnosis not present

## 2021-11-08 NOTE — Telephone Encounter (Signed)
See result note.  

## 2021-11-08 NOTE — Telephone Encounter (Addendum)
12:10 PM: Spoke to patient to review the lab results and medication changes below. Patient verbalized understanding of worsening kidney function and holding metolazone at this time, and she states she will take an additional two potassium pills today. She confirmed that she will make her PCP appt on 7/20 and have labs rechecked at that time. Instructed patient to call us if she experiences weight gain, increased swelling, increased shortness of breath, or any other symptoms of concern, or questions. Georg Ruddle, RN  ----- Message from Alisa Graff, [place] sent at 11/08/2021  9:30 AM EDT ----- Potassium is a little low so take an additional 2 potassium tablets. Kidney function has worsened some and I've contacted your kidney doctor to make him aware. He agrees with not taking anymore metolazone at this time. Labs will be rechecked at your PCP appointment next week and you will receive a call from your PCP office regarding the other lab work I checked at their request.

## 2021-11-09 ENCOUNTER — Telehealth: Payer: Self-pay | Admitting: *Deleted

## 2021-11-09 DIAGNOSIS — D631 Anemia in chronic kidney disease: Secondary | ICD-10-CM | POA: Diagnosis not present

## 2021-11-09 DIAGNOSIS — I2510 Atherosclerotic heart disease of native coronary artery without angina pectoris: Secondary | ICD-10-CM | POA: Diagnosis not present

## 2021-11-09 DIAGNOSIS — I5032 Chronic diastolic (congestive) heart failure: Secondary | ICD-10-CM | POA: Diagnosis not present

## 2021-11-09 DIAGNOSIS — N1832 Chronic kidney disease, stage 3b: Secondary | ICD-10-CM | POA: Diagnosis not present

## 2021-11-09 DIAGNOSIS — I252 Old myocardial infarction: Secondary | ICD-10-CM | POA: Diagnosis not present

## 2021-11-09 DIAGNOSIS — I130 Hypertensive heart and chronic kidney disease with heart failure and stage 1 through stage 4 chronic kidney disease, or unspecified chronic kidney disease: Secondary | ICD-10-CM | POA: Diagnosis not present

## 2021-11-09 NOTE — Telephone Encounter (Signed)
-----   Message from Einar Pheasant, MD sent at 11/09/2021  5:16 AM EDT ----- Notify Ms Helling that her abdominal ultrasound reveals some scarring in the liver (appears to be c/w cirrhosis) and small amount of fluid in her abdomen.  She has seen GI [facility]).  Given liver changes, needs f/u with GI for further evaluation and treatment.  This can be contributing to her overall swelling.  If agreeable, let me know and will contact [facility] for f/u appt with GI.

## 2021-11-09 NOTE — Telephone Encounter (Signed)
Patient returned Kerin Salen, RN's call.  Please call.

## 2021-11-09 NOTE — Telephone Encounter (Signed)
Please direct call to me.

## 2021-11-10 DIAGNOSIS — D631 Anemia in chronic kidney disease: Secondary | ICD-10-CM | POA: Diagnosis not present

## 2021-11-10 DIAGNOSIS — I5032 Chronic diastolic (congestive) heart failure: Secondary | ICD-10-CM | POA: Diagnosis not present

## 2021-11-10 DIAGNOSIS — I252 Old myocardial infarction: Secondary | ICD-10-CM | POA: Diagnosis not present

## 2021-11-10 DIAGNOSIS — N1832 Chronic kidney disease, stage 3b: Secondary | ICD-10-CM | POA: Diagnosis not present

## 2021-11-10 DIAGNOSIS — I2510 Atherosclerotic heart disease of native coronary artery without angina pectoris: Secondary | ICD-10-CM | POA: Diagnosis not present

## 2021-11-10 DIAGNOSIS — I130 Hypertensive heart and chronic kidney disease with heart failure and stage 1 through stage 4 chronic kidney disease, or unspecified chronic kidney disease: Secondary | ICD-10-CM | POA: Diagnosis not present

## 2021-11-10 NOTE — Telephone Encounter (Signed)
See result note patient seeing Dr Allen [name]

## 2021-11-12 ENCOUNTER — Other Ambulatory Visit: Payer: Self-pay | Admitting: Internal Medicine

## 2021-11-12 DIAGNOSIS — R188 Other ascites: Secondary | ICD-10-CM

## 2021-11-12 NOTE — Progress Notes (Signed)
Order placed for GI referral.   

## 2021-11-15 DIAGNOSIS — I252 Old myocardial infarction: Secondary | ICD-10-CM | POA: Diagnosis not present

## 2021-11-15 DIAGNOSIS — I5032 Chronic diastolic (congestive) heart failure: Secondary | ICD-10-CM | POA: Diagnosis not present

## 2021-11-15 DIAGNOSIS — I2510 Atherosclerotic heart disease of native coronary artery without angina pectoris: Secondary | ICD-10-CM | POA: Diagnosis not present

## 2021-11-15 DIAGNOSIS — N1832 Chronic kidney disease, stage 3b: Secondary | ICD-10-CM | POA: Diagnosis not present

## 2021-11-15 DIAGNOSIS — I130 Hypertensive heart and chronic kidney disease with heart failure and stage 1 through stage 4 chronic kidney disease, or unspecified chronic kidney disease: Secondary | ICD-10-CM | POA: Diagnosis not present

## 2021-11-15 DIAGNOSIS — D631 Anemia in chronic kidney disease: Secondary | ICD-10-CM | POA: Diagnosis not present

## 2021-11-16 DIAGNOSIS — N1832 Chronic kidney disease, stage 3b: Secondary | ICD-10-CM | POA: Diagnosis not present

## 2021-11-16 DIAGNOSIS — I252 Old myocardial infarction: Secondary | ICD-10-CM | POA: Diagnosis not present

## 2021-11-16 DIAGNOSIS — I130 Hypertensive heart and chronic kidney disease with heart failure and stage 1 through stage 4 chronic kidney disease, or unspecified chronic kidney disease: Secondary | ICD-10-CM | POA: Diagnosis not present

## 2021-11-16 DIAGNOSIS — D631 Anemia in chronic kidney disease: Secondary | ICD-10-CM | POA: Diagnosis not present

## 2021-11-16 DIAGNOSIS — I2510 Atherosclerotic heart disease of native coronary artery without angina pectoris: Secondary | ICD-10-CM | POA: Diagnosis not present

## 2021-11-16 DIAGNOSIS — I5032 Chronic diastolic (congestive) heart failure: Secondary | ICD-10-CM | POA: Diagnosis not present

## 2021-11-17 ENCOUNTER — Ambulatory Visit (INDEPENDENT_AMBULATORY_CARE_PROVIDER_SITE_OTHER): Payer: Medicare Other | Admitting: Internal Medicine

## 2021-11-17 ENCOUNTER — Encounter: Payer: Self-pay | Admitting: Internal Medicine

## 2021-11-17 VITALS — BP 122/68 | HR 68 | Temp 98.0°F | Resp 17 | Ht 60.0 in | Wt 172.3 lb

## 2021-11-17 DIAGNOSIS — G4733 Obstructive sleep apnea (adult) (pediatric): Secondary | ICD-10-CM | POA: Diagnosis not present

## 2021-11-17 DIAGNOSIS — I700 Atherosclerosis of aorta: Secondary | ICD-10-CM | POA: Diagnosis not present

## 2021-11-17 DIAGNOSIS — K219 Gastro-esophageal reflux disease without esophagitis: Secondary | ICD-10-CM

## 2021-11-17 DIAGNOSIS — R7989 Other specified abnormal findings of blood chemistry: Secondary | ICD-10-CM

## 2021-11-17 DIAGNOSIS — E7800 Pure hypercholesterolemia, unspecified: Secondary | ICD-10-CM

## 2021-11-17 DIAGNOSIS — D509 Iron deficiency anemia, unspecified: Secondary | ICD-10-CM

## 2021-11-17 DIAGNOSIS — I5032 Chronic diastolic (congestive) heart failure: Secondary | ICD-10-CM

## 2021-11-17 DIAGNOSIS — I480 Paroxysmal atrial fibrillation: Secondary | ICD-10-CM | POA: Diagnosis not present

## 2021-11-17 DIAGNOSIS — I2510 Atherosclerotic heart disease of native coronary artery without angina pectoris: Secondary | ICD-10-CM | POA: Diagnosis not present

## 2021-11-17 DIAGNOSIS — D649 Anemia, unspecified: Secondary | ICD-10-CM

## 2021-11-17 DIAGNOSIS — R600 Localized edema: Secondary | ICD-10-CM

## 2021-11-17 DIAGNOSIS — Z9989 Dependence on other enabling machines and devices: Secondary | ICD-10-CM

## 2021-11-17 DIAGNOSIS — I10 Essential (primary) hypertension: Secondary | ICD-10-CM

## 2021-11-17 DIAGNOSIS — N184 Chronic kidney disease, stage 4 (severe): Secondary | ICD-10-CM

## 2021-11-17 DIAGNOSIS — N1832 Chronic kidney disease, stage 3b: Secondary | ICD-10-CM | POA: Diagnosis not present

## 2021-11-17 DIAGNOSIS — J449 Chronic obstructive pulmonary disease, unspecified: Secondary | ICD-10-CM | POA: Diagnosis not present

## 2021-11-17 DIAGNOSIS — E1122 Type 2 diabetes mellitus with diabetic chronic kidney disease: Secondary | ICD-10-CM

## 2021-11-17 DIAGNOSIS — D631 Anemia in chronic kidney disease: Secondary | ICD-10-CM | POA: Diagnosis not present

## 2021-11-17 DIAGNOSIS — K449 Diaphragmatic hernia without obstruction or gangrene: Secondary | ICD-10-CM

## 2021-11-17 DIAGNOSIS — I25110 Atherosclerotic heart disease of native coronary artery with unstable angina pectoris: Secondary | ICD-10-CM | POA: Diagnosis not present

## 2021-11-17 DIAGNOSIS — I130 Hypertensive heart and chronic kidney disease with heart failure and stage 1 through stage 4 chronic kidney disease, or unspecified chronic kidney disease: Secondary | ICD-10-CM | POA: Diagnosis not present

## 2021-11-17 DIAGNOSIS — Z9861 Coronary angioplasty status: Secondary | ICD-10-CM

## 2021-11-17 DIAGNOSIS — E876 Hypokalemia: Secondary | ICD-10-CM | POA: Diagnosis not present

## 2021-11-17 DIAGNOSIS — I252 Old myocardial infarction: Secondary | ICD-10-CM | POA: Diagnosis not present

## 2021-11-17 LAB — HM DIABETES FOOT EXAM

## 2021-11-17 NOTE — Progress Notes (Signed)
Patient ID: April [name], female   DOB: 09/07/1940, 81 y.o.   MRN: [id number]   Subjective:    Patient ID: April [name], female    DOB: 04-05-1941, 81 y.o.   MRN: [id number]   Patient here for a scheduled follow up .   HPI She is accompanied by her son.  Here to follow up regarding CHF, CAD, hypertension, lower extremity swelling and abnormal liver function tests.  She reports noticing some increase in her tremor today.  Breathing appears to be stable overall.  Taking Torsemide - two tablets in am and one in pm.  Taking potassium supplements as well.  Some weakness.  No further bleeding.  Requested hgb check with labs today.  Recently found to have liver changes noted on ultrasound that appear to be c/w cirrhosis.  Off statin now.  Some minimal ascites.  Discussed liver findings today.  Has appt with GI in November.  Discussed earlier appt.  She is weighing regularly.  Weight at home 170 lbs.  No nausea or vomiting.  No increased abdominal pain.  Does feel full.  Bowels moving.  No bleeding.     Past Medical History:  Diagnosis Date   Anemia    CAD S/P PCI pRCA Promus DES 3.5 x 16 (4.1 mm), ostRPDA Promus DES 2.5 x 12 (2.7 mm) 03/25/2016   CAD S/P percutaneous coronary angioplasty    a. 07/2015 MV: No ischemia, EF 66%;  b. 03/2016 [place] STEMI/PCI: LM nl, LAD 25d, RI nl, LCX nl, OM1/2 nl, RCA 80p (3.5x16 Promus Premier DES), 50p/m, 30d, RPDA 90 (2.5x12 Promu Premier DES); c. 01/2018 Cath ([place], [place]): Patent RCA stents->Med Rx.   CHF (congestive heart failure) ([place])    CKD (chronic kidney disease), stage III ([place])    Diabetes mellitus without complication ([place])    Diastolic dysfunction    a. 10/2013 Echo: EF 55-65%, Gr1 DD; b. 01/2018 Echo ([place], [place]): EF 55-60%, Gr2 DD, RVSP 6mHg.   Diverticulitis    Dyspnea    Endometriosis    Family history of adverse reaction to anesthesia    Mother - had to stay in ICU because of breathing difficulties every  time she had anesthesia   GERD (gastroesophageal reflux disease)    GI bleed    a. 06/2021 - melena-->2 u PRBCs.   Hiatal hernia    History of colon polyps 08/1994   History of Migraines    Hypercholesterolemia    Hypertension    LBBB (left bundle branch block)    Osteoporosis    PAF (paroxysmal atrial fibrillation) ([place]    a. 03/2016 Dx @ time of MI; b. CHA2DS2VASc = 5-->Eliquis.   Rheumatic fever    Sleep apnea    CPAP   Spasmodic dysphonia    ST elevation myocardial infarction (STEMI) of inferolateral wall, initial episode of care ([place] 03/25/2016   ST elevation myocardial infarction (STEMI) of inferolateral wall, initial episode of care ([facility] 03/25/2016   Urine incontinence    Past Surgical History:  Procedure Laterality Date   ABDOMINAL HYSTERECTOMY     ovaries not removed   APPENDECTOMY     was removed during hysterectomy   Breast biopsies     x2   BREAST EXCISIONAL BIOPSY Bilateral "years ago"   neg   BREAST SURGERY Bilateral    BROW LIFT Bilateral 08/15/2019   Procedure: BLEPHAROPLASTY UPPER EYELID; W/EXCESS SKIN BLEPHAROPTOSIS REPAIR; RESECT EX;  Surgeon: FKarle Starch MD;  Location: [place];  Service: Ophthalmology;  Laterality: Bilateral;  sleep apnea   CARDIAC CATHETERIZATION  2011   moderate 40% RCA disease   CARDIAC CATHETERIZATION  01/2010   Dr. Suzie Portela : Only noted 40% RCA   CARDIAC CATHETERIZATION N/A 03/25/2016   Procedure: Left Heart Cath and Coronary Angiography;  Surgeon: Leonie Man, MD;  Location: [place] [facility];  Service: Cardiovascular;  Laterality: N/A;   CARDIAC CATHETERIZATION N/A 03/25/2016   Procedure: Coronary Stent Intervention;  Surgeon: Leonie Man, MD;  Location: [place] [facility];  Service: Cardiovascular;  Laterality: N/A;   COLONOSCOPY  2013   COLONOSCOPY WITH PROPOFOL N/A 07/11/2021   Procedure: COLONOSCOPY WITH PROPOFOL;  Surgeon: Lucilla Lame, MD;  Location[facility] ENDOSCOPY;  Service: Endoscopy;   Laterality: N/A;   ESOPHAGOGASTRODUODENOSCOPY (EGD) WITH PROPOFOL N/A 03/17/2015   Procedure: ESOPHAGOGASTRODUODENOSCOPY (EGD) WITH PROPOFOL;  Surgeon: Christene Lye, MD;  Location: [facility] ENDOSCOPY;  Service: Gastroenterology;  Laterality: N/A;   ESOPHAGOGASTRODUODENOSCOPY (EGD) WITH PROPOFOL N/A 07/10/2021   Procedure: ESOPHAGOGASTRODUODENOSCOPY (EGD) WITH PROPOFOL;  Surgeon: Lin Landsman, MD;  Location: [facility] ENDOSCOPY;  Service: Gastroenterology;  Laterality: N/A;   GIVENS CAPSULE STUDY N/A 07/11/2021   Procedure: GIVENS CAPSULE STUDY;  Surgeon: Lucilla Lame, MD;  Location[facility] ENDOSCOPY;  Service: Endoscopy;  Laterality: N/A;   NM MYOVIEW ([place] [facility])  07/2015   No evidence ischemia or infarction. EF 66%. Low risk   TONSILLECTOMY     TRANSTHORACIC ECHOCARDIOGRAM  10/2013   Normal LV size and function. EF 55-65%. GR 1 DD. Otherwise normal.   Family History  Problem Relation Age of Onset   Arthritis Mother    Stroke Mother    Hypertension Mother    Osteoporosis Mother    Heart disease Father        MI   Heart attack Father    Stroke Brother    Heart attack Sister    Breast cancer Other        first cousin x 2   Stroke Daughter    Heart attack Daughter    Colon cancer Neg Hx    Social History   Socioeconomic History   Marital status: Widowed    Spouse name: Not on file   Number of children: 2   Years of education: Not on file   Highest education level: Not on file  Occupational History   Not on file  Tobacco Use   Smoking status: Never   Smokeless tobacco: Never  Vaping Use   Vaping Use: Never used  Substance and Sexual Activity   Alcohol use: No    Alcohol/week: 0.0 standard drinks of alcohol   Drug use: No   Sexual activity: Not Currently    Birth control/protection: Post-menopausal  Other Topics Concern   Not on file  Social History Narrative   Not on file   Social Determinants of Health   Financial Resource Strain: Not on file  Food Insecurity:  Not on file  Transportation Needs: Not on file  Physical Activity: Not on file  Stress: Not on file  Social Connections: Not on file     Review of Systems  Constitutional:  Positive for fatigue. Negative for fever and unexpected weight change.  HENT:  Negative for congestion and sinus pressure.   Respiratory:  Negative for cough and chest tightness.        Chronic sob. Overall stable.   Cardiovascular:  Negative for chest pain and palpitations.  Persistent leg swelling - by has improved.   Gastrointestinal:  Negative for abdominal pain, diarrhea, nausea and vomiting.  Genitourinary:  Negative for difficulty urinating and dysuria.  Musculoskeletal:  Negative for joint swelling and myalgias.  Skin:  Negative for color change and rash.  Neurological:  Negative for dizziness, light-headedness and headaches.  Psychiatric/Behavioral:  Negative for agitation and dysphoric mood.        Objective:     BP 122/68 (BP Location: Left Arm, Patient Position: Sitting, Cuff Size: Small)   Pulse 68   Temp 98 F (36.7 C) (Temporal)   Resp 17   Ht 5' (1.524 m)   Wt 172 lb 4.8 oz (78.2 kg)   LMP 05/01/1972   SpO2 97%   BMI 33.65 kg/m  Wt Readings from Last 3 Encounters:  11/17/21 172 lb 4.8 oz (78.2 kg)  11/07/21 169 lb 8 oz (76.9 kg)  10/27/21 172 lb (78 kg)    Physical Exam Vitals reviewed.  Constitutional:      General: She is not in acute distress.    Appearance: Normal appearance.  HENT:     Head: Normocephalic and atraumatic.     Right Ear: External ear normal.     Left Ear: External ear normal.  Eyes:     General: No scleral icterus.       Right eye: No discharge.        Left eye: No discharge.     Conjunctiva/sclera: Conjunctivae normal.  Neck:     Thyroid: No thyromegaly.  Cardiovascular:     Rate and Rhythm: Normal rate and regular rhythm.  Pulmonary:     Effort: No respiratory distress.     Breath sounds: Normal breath sounds. No wheezing.  Abdominal:      General: Bowel sounds are normal.     Palpations: Abdomen is soft.     Tenderness: There is no abdominal tenderness.  Musculoskeletal:        General: No tenderness.     Cervical back: Neck supple. No tenderness.     Comments: Pedal and lower extremity edema (improved from previous checks).   Lymphadenopathy:     Cervical: No cervical adenopathy.  Skin:    Findings: No erythema or rash.  Neurological:     Mental Status: She is alert.  Psychiatric:        Mood and Affect: Mood normal.        Behavior: Behavior normal.      Outpatient Encounter Medications as of 11/17/2021  Medication Sig   acetaminophen (TYLENOL) 500 MG tablet Take 500 mg by mouth daily as needed for headache (pain).   albuterol (PROVENTIL HFA;VENTOLIN HFA) 108 (90 Base) MCG/ACT inhaler Inhale 2 puffs into the lungs every 6 (six) hours as needed for wheezing or shortness of breath.   amiodarone (PACERONE) 200 MG tablet TAKE 1 TABLET BY MOUTH DAILY   cholecalciferol (VITAMIN D3) 25 MCG (1000 UNIT) tablet Take 1,000 Units by mouth daily.   fluticasone-salmeterol (ADVAIR HFA) 230-21 MCG/ACT inhaler Inhale 2 puffs into the lungs 2 (two) times daily.   Glucosamine-Chondroit-Collagen 250-200-116.67 MG CAPS Take 1 tablet by mouth in the morning and at bedtime.   metolazone (ZAROXOLYN) 2.5 MG tablet Take 1 tablet (2.5 mg total) by mouth daily as needed (Metolazone with extra 40 potassium preserved for refractory weight gain (thus only if there is no significant improovement with extra Torsemide)).   mupirocin ointment (BACTROBAN) 2 % Apply 1 application. topically 2 (two) times daily.  nitroGLYCERIN (NITROSTAT) 0.4 MG SL tablet Place 1 tablet (0.4 mg total) under the tongue every 5 (five) minutes as needed.   Polyethyl Glycol-Propyl Glycol (SYSTANE OP) Place 1 drop into both eyes daily as needed (dry eyes).   potassium chloride SA (KLOR-CON M) 20 MEQ tablet TAKE 1 TABLET BY MOUTH TWICE A DAY.   Propylene Glycol (SYSTANE  COMPLETE) 0.6 % SOLN Apply 1 drop to eye daily.   torsemide (DEMADEX) 20 MG tablet Take 2 tablets (40 mg total) by mouth daily.   torsemide 40 MG TABS Take 40 mg by mouth daily as needed (for 3 pound weight gain with extra potassium 40 meq).   [DISCONTINUED] rosuvastatin (CRESTOR) 40 MG tablet TAKE 1 TABLET BY MOUTH DAILY   [DISCONTINUED] traMADol (ULTRAM) 50 MG tablet Take by mouth every 6 (six) hours as needed. (Patient not taking: Reported on 11/07/2021)   No facility-administered encounter medications on file as of 11/17/2021.     Lab Results  Component Value Date   WBC 10.9 (H) 11/07/2021   HGB 9.9 (L) 11/07/2021   HCT 29.9 (L) 11/07/2021   PLT 189 11/07/2021   GLUCOSE 133 (H) 11/07/2021   CHOL 129 01/20/2021   TRIG 80.0 01/20/2021   HDL 50.90 01/20/2021   LDLCALC 62 01/20/2021   ALT 36 11/07/2021   AST 83 (H) 11/07/2021   NA 135 11/07/2021   K 3.2 (L) 11/07/2021   CL 93 (L) 11/07/2021   CREATININE 2.47 (H) 11/07/2021   BUN 31 (H) 11/07/2021   CO2 31 11/07/2021   TSH 1.33 09/21/2021   INR 0.99 03/25/2016   HGBA1C 6.3 09/21/2021   MICROALBUR <0.7 04/29/2020    US Abdomen Complete  Result Date: 11/08/2021 CLINICAL DATA:  Elevated liver enzymes EXAM: ABDOMEN ULTRASOUND COMPLETE COMPARISON:  Abdominal ultrasound 03/22/2020 FINDINGS: Gallbladder: No gallstones or wall thickening visualized. No sonographic Murphy sign noted by sonographer. Common bile duct: Diameter: 7 mm Liver: Nodular contour with coarse appearance of the parenchyma consistent with cirrhosis. No focal hepatic mass identified. Portal vein is patent on color Doppler imaging with normal direction of blood flow towards the liver. IVC: No abnormality visualized. Pancreas: Visualized portion unremarkable. Spleen: Size and appearance within normal limits. Right Kidney: Length: 10.1 cm. Echogenicity within normal limits. No mass or hydronephrosis visualized. Left Kidney: Length: 9.7 cm. Echogenicity within normal limits.  3.3 cm anechoic cyst in the lower pole and subcentimeter cyst in the midpole. No suspicious mass or hydronephrosis visualized. Abdominal aorta: No aneurysm visualized. Other findings: Small volume ascites. IMPRESSION: 1. Hepatic cirrhosis. 2. Small volume ascites. 3. Left renal cysts. Electronically Signed   By: Ofilia Neas M.D.   On: 11/08/2021 12:08       Assessment & Plan:   Problem List Items Addressed This Visit     Abnormal liver function tests    Recent ultrasound as outlined.  Changes c/w cirrhosis.  Discussed today.  Recent platelet count wnl.  Will check pt/inr with next labs.  Off statin now.  Contact GI for earlier follow up.       Relevant Orders   Hepatic function panel   Aortic atherosclerosis ([place])    Off crestor due to cirrhosis.  Continue blood pressure control.        CAD S/P PCI pRCA Promus DES 3.5 x 16 (4.1 mm), ostRPDA Promus DES 2.5 x 12 (2.7 mm)    No chest pain.  Off eliquis due to GI bleed.  Breathing is stable.  With chronic sob.  Continues torsemide as directed. .  F/u met b results today.        Chronic heart failure with preserved ejection fraction [facility])    She is weighing herself daily.  Continues on torsemide as directed.  Potassium supplements.  Met b checked today.  F/u on results.  Avoid increased salt intake.  Weight relatively stable on her home checks.        Chronic obstructive pulmonary disease, unspecified COPD type ([place])    Breathing overall stable.  Did require advair today.  Helped.  Feels is related to weather/heat.  Discussed using advair on a more regular basis.  No increased cough.  Follow.        Coronary artery disease involving native coronary artery of native heart with unstable angina pectoris (HCC)    No chest pain.  Off eliquis due to GI bleed.  Breathing is stable.  With chronic sob.  Continues torsemide as directed.  F/u met b results today.        Diabetes mellitus ([place])    Low carb diet and exercise.  Follow met b and  a1c.       GERD (gastroesophageal reflux disease)    Continue protonix.       Hiatal hernia    Has a large hiatal hernia.  On PPI. Follow.       Hypokalemia    On potassium supplements.  Recheck metabolic panel today.       Relevant Orders   Basic Metabolic Panel (BMET)   Iron deficiency anemia    Recent admission for GI bleed.  hgb remains decreased.  No further bleeding.  Recheck cbc today to confirm stable.  Has appt with Dr Rogue Bussing 11/29/21.       Lower extremity edema    Swelling has improved some.  Taking torsemide as directed.  Leg elevation.  Avoid increased sodium intake.  Declines wraps.       OSA on CPAP    Continue cpap.       Paroxysmal atrial fibrillation (HCC)    Had been on eliquis and amiodarone.  Off eliquis recently secondary to GI bleed. Continues amiodarone.   Followed by cardiology. Rated controlled.       Essential hypertension, benign (Chronic)    Blood pressure as outlined.  Continues on torsemide. Follow pressures.  Follow metabolic panel.       Hypercholesterolemia (Chronic)    Off crestor due to concern regarding cirrhosis.  Continue low cholesterol diet and exercise.  We will follow.       Other Visit Diagnoses     Anemia, unspecified type    -  Primary   Relevant Orders   CBC w/Diff        Einar Pheasant, MD

## 2021-11-18 ENCOUNTER — Encounter: Payer: Self-pay | Admitting: Internal Medicine

## 2021-11-18 LAB — CBC WITH DIFFERENTIAL/PLATELET
Basophils Absolute: 0.1 10*3/uL (ref 0.0–0.1)
Basophils Relative: 0.9 % (ref 0.0–3.0)
Eosinophils Absolute: 0 10*3/uL (ref 0.0–0.7)
Eosinophils Relative: 0.1 % (ref 0.0–5.0)
HCT: 28.3 % — ABNORMAL LOW (ref 36.0–46.0)
Hemoglobin: 9.5 g/dL — ABNORMAL LOW (ref 12.0–15.0)
Lymphocytes Relative: 19.6 % (ref 12.0–46.0)
Lymphs Abs: 2.1 10*3/uL (ref 0.7–4.0)
MCHC: 33.7 g/dL (ref 30.0–36.0)
MCV: 90.7 fl (ref 78.0–100.0)
Monocytes Absolute: 1.6 10*3/uL — ABNORMAL HIGH (ref 0.1–1.0)
Monocytes Relative: 14.8 % — ABNORMAL HIGH (ref 3.0–12.0)
Neutro Abs: 6.9 10*3/uL (ref 1.4–7.7)
Neutrophils Relative %: 64.6 % (ref 43.0–77.0)
Platelets: 185 10*3/uL (ref 150.0–400.0)
RBC: 3.12 Mil/uL — ABNORMAL LOW (ref 3.87–5.11)
RDW: 20 % — ABNORMAL HIGH (ref 11.5–15.5)
WBC: 10.7 10*3/uL — ABNORMAL HIGH (ref 4.0–10.5)

## 2021-11-18 LAB — HEPATIC FUNCTION PANEL
ALT: 34 U/L (ref 0–35)
AST: 73 U/L — ABNORMAL HIGH (ref 0–37)
Albumin: 2.8 g/dL — ABNORMAL LOW (ref 3.5–5.2)
Alkaline Phosphatase: 127 U/L — ABNORMAL HIGH (ref 39–117)
Bilirubin, Direct: 0.6 mg/dL — ABNORMAL HIGH (ref 0.0–0.3)
Total Bilirubin: 1.7 mg/dL — ABNORMAL HIGH (ref 0.2–1.2)
Total Protein: 6.1 g/dL (ref 6.0–8.3)

## 2021-11-18 LAB — BASIC METABOLIC PANEL
BUN: 23 mg/dL (ref 6–23)
CO2: 24 mEq/L (ref 19–32)
Calcium: 8.8 mg/dL (ref 8.4–10.5)
Chloride: 100 mEq/L (ref 96–112)
Creatinine, Ser: 2.07 mg/dL — ABNORMAL HIGH (ref 0.40–1.20)
GFR: 21.83 mL/min — ABNORMAL LOW (ref >60.00)
Glucose, Bld: 139 mg/dL — ABNORMAL HIGH (ref 70–99)
Potassium: 4.5 mEq/L (ref 3.5–5.1)
Sodium: 137 mEq/L (ref 135–145)

## 2021-11-18 NOTE — Assessment & Plan Note (Signed)
Breathing overall stable.  Did require advair today.  Helped.  Feels is related to weather/heat.  Discussed using advair on a more regular basis.  No increased cough.  Follow.

## 2021-11-18 NOTE — Assessment & Plan Note (Signed)
Off crestor due to concern regarding cirrhosis.  Continue low cholesterol diet and exercise.  We will follow.

## 2021-11-18 NOTE — Assessment & Plan Note (Signed)
Had been on eliquis and amiodarone.  Off eliquis recently secondary to GI bleed. Continues amiodarone.   Followed by cardiology. Rated controlled.

## 2021-11-18 NOTE — Assessment & Plan Note (Signed)
Recent ultrasound as outlined.  Changes c/w cirrhosis.  Discussed today.  Recent platelet count wnl.  Will check pt/inr with next labs.  Off statin now.  Contact GI for earlier follow up.

## 2021-11-18 NOTE — Assessment & Plan Note (Signed)
Low carb diet and exercise.  Follow met b and a1c.

## 2021-11-18 NOTE — Assessment & Plan Note (Signed)
No chest pain.  Off eliquis due to GI bleed.  Breathing is stable.  With chronic sob.  Continues torsemide as directed. .  F/u met b results today.   

## 2021-11-18 NOTE — Assessment & Plan Note (Signed)
Off crestor due to cirrhosis.  Continue blood pressure control.

## 2021-11-18 NOTE — Assessment & Plan Note (Signed)
No chest pain.  Off eliquis due to GI bleed.  Breathing is stable.  With chronic sob.  Continues torsemide as directed. .  F/u met b results today.

## 2021-11-18 NOTE — Assessment & Plan Note (Signed)
She is weighing herself daily.  Continues on torsemide as directed.  Potassium supplements.  Met b checked today.  F/u on results.  Avoid increased salt intake.  Weight relatively stable on her home checks.

## 2021-11-18 NOTE — Assessment & Plan Note (Signed)
Recent admission for GI bleed.  hgb remains decreased.  No further bleeding.  Recheck cbc today to confirm stable.  Has appt with Dr Rogue Bussing 11/29/21.

## 2021-11-18 NOTE — Assessment & Plan Note (Signed)
On potassium supplements.  Recheck metabolic panel today.

## 2021-11-18 NOTE — Assessment & Plan Note (Signed)
Continue protonix  

## 2021-11-18 NOTE — Assessment & Plan Note (Addendum)
Swelling has improved some.  Taking torsemide as directed.  Leg elevation.  Avoid increased sodium intake.  Declines wraps.

## 2021-11-18 NOTE — Assessment & Plan Note (Signed)
Blood pressure as outlined.  Continues on torsemide. Follow pressures.  Follow metabolic panel.

## 2021-11-18 NOTE — Assessment & Plan Note (Signed)
Has a large hiatal hernia.  On PPI. Follow.

## 2021-11-18 NOTE — Assessment & Plan Note (Signed)
Continue cpap.  

## 2021-11-21 DIAGNOSIS — N1832 Chronic kidney disease, stage 3b: Secondary | ICD-10-CM | POA: Diagnosis not present

## 2021-11-21 DIAGNOSIS — I2510 Atherosclerotic heart disease of native coronary artery without angina pectoris: Secondary | ICD-10-CM | POA: Diagnosis not present

## 2021-11-21 DIAGNOSIS — I130 Hypertensive heart and chronic kidney disease with heart failure and stage 1 through stage 4 chronic kidney disease, or unspecified chronic kidney disease: Secondary | ICD-10-CM | POA: Diagnosis not present

## 2021-11-21 DIAGNOSIS — D631 Anemia in chronic kidney disease: Secondary | ICD-10-CM | POA: Diagnosis not present

## 2021-11-21 DIAGNOSIS — I252 Old myocardial infarction: Secondary | ICD-10-CM | POA: Diagnosis not present

## 2021-11-21 DIAGNOSIS — I5032 Chronic diastolic (congestive) heart failure: Secondary | ICD-10-CM | POA: Diagnosis not present

## 2021-11-22 DIAGNOSIS — R600 Localized edema: Secondary | ICD-10-CM | POA: Diagnosis not present

## 2021-11-22 DIAGNOSIS — E876 Hypokalemia: Secondary | ICD-10-CM | POA: Diagnosis not present

## 2021-11-22 DIAGNOSIS — N184 Chronic kidney disease, stage 4 (severe): Secondary | ICD-10-CM | POA: Diagnosis not present

## 2021-11-22 DIAGNOSIS — I890 Lymphedema, not elsewhere classified: Secondary | ICD-10-CM | POA: Diagnosis not present

## 2021-11-22 DIAGNOSIS — I129 Hypertensive chronic kidney disease with stage 1 through stage 4 chronic kidney disease, or unspecified chronic kidney disease: Secondary | ICD-10-CM | POA: Diagnosis not present

## 2021-11-22 NOTE — Progress Notes (Unsigned)
Electrophysiology Office Note:    Date:  11/23/2021   ID:  April [name], DOB 26-Nov-1940, MRN [id number]  PCP:  Einar Pheasant, MD  [facility] Cardiologist:  Ida Rogue, MD  [facility] Electrophysiologist:  Vickie Epley, MD   Referring MD: Antony Madura, PA-C   Chief Complaint: Atrial fibrillation  History of Present Illness:    April [name] is a 81 y.o. female who presents for an evaluation of atrial fibrillation at the request of Cadence Furth, PA-C. Their medical history includes coronary artery disease, lymphedema, paroxysmal atrial fibrillation and GI bleeding.  The patient last saw Dr. Rockey Situ on October 03, 2021.  This was in follow-up after hospitalization at [facility] in March 2023 for symptomatic anemia with melena.  She required blood transfusion during that hospitalization and her Eliquis was held.  No clear source of bleeding was found.  She was then restarted on her anticoagulant but was again hospitalized at [facility] in early April.  She was then restarted on half dose Eliquis but again was readmitted to [facility] and the middle of April with acute on chronic diastolic heart failure and recurrent melena.  She was placed on amiodarone.  Today she tells me she has been off her blood thinner for several months.  She has noticed a rapid increase in swelling in her lower extremities recently.  She noticed a decrease in her urine output when she takes 80 mg of torsemide daily.  She has a prescription for metolazone but does not been taking it.   Past Medical History:  Diagnosis Date   Anemia    CAD S/P PCI pRCA Promus DES 3.5 x 16 (4.1 mm), ostRPDA Promus DES 2.5 x 12 (2.7 mm) 03/25/2016   CAD S/P percutaneous coronary angioplasty    a. 07/2015 MV: No ischemia, EF 66%;  b. 03/2016 Inflat STEMI/PCI: LM nl, LAD 25d, RI nl, LCX nl, OM1/2 nl, RCA 80p (3.5x16 Promus Premier DES), 50p/m, 30d, RPDA 90 (2.5x12 Promu Premier DES); c. 01/2018 Cath ([place], [place]): Patent RCA stents->Med Rx.   CHF (congestive heart failure) ([place])    CKD (chronic kidney disease), stage III ([place])    Diabetes mellitus without complication ([place])    Diastolic dysfunction    a. 10/2013 Echo: EF 55-65%, Gr1 DD; b. 01/2018 Echo ([place], [place]): EF 55-60%, Gr2 DD, RVSP 21mHg.   Diverticulitis    Dyspnea    Endometriosis    Family history of adverse reaction to anesthesia    Mother - had to stay in ICU because of breathing difficulties every time she had anesthesia   GERD (gastroesophageal reflux disease)    GI bleed    a. 06/2021 - melena-->2 u PRBCs.   Hiatal hernia    History of colon polyps 08/1994   History of Migraines    Hypercholesterolemia    Hypertension    LBBB (left bundle branch block)    Osteoporosis    PAF (paroxysmal atrial fibrillation) ([place]    a. 03/2016 Dx @ time of MI; b. CHA2DS2VASc = 5-->Eliquis.   Rheumatic fever    Sleep apnea    CPAP   Spasmodic dysphonia    ST elevation myocardial infarction (STEMI) of inferolateral wall, initial episode of care ([facility] 03/25/2016   ST elevation myocardial infarction (STEMI) of inferolateral wall, initial episode of care ([facility] 03/25/2016   Urine incontinence     Past Surgical History:  Procedure Laterality Date   ABDOMINAL  HYSTERECTOMY     ovaries not removed   APPENDECTOMY     was removed during hysterectomy   Breast biopsies     x2   BREAST EXCISIONAL BIOPSY Bilateral "years ago"   neg   BREAST SURGERY Bilateral    BROW LIFT Bilateral 08/15/2019   Procedure: BLEPHAROPLASTY UPPER EYELID; W/EXCESS SKIN BLEPHAROPTOSIS REPAIR; RESECT EX;  Surgeon: Karle Starch, MD;  Location: [place];  Service: Ophthalmology;  Laterality: Bilateral;  sleep apnea   CARDIAC CATHETERIZATION  2011   moderate 40% RCA disease   CARDIAC CATHETERIZATION  01/2010   Dr. Gollan'@[facility]'$ : Only noted 40% RCA   CARDIAC CATHETERIZATION N/A 03/25/2016   Procedure: Left Heart Cath  and Coronary Angiography;  Surgeon: 04/07/2016, MD;  Location: [place] CV LAB;  Service: Cardiovascular;  Laterality: N/A;   CARDIAC CATHETERIZATION N/A 03/25/2016   Procedure: Coronary Stent Intervention;  Surgeon: 04/07/2016, MD;  Location: [place] CV LAB;  Service: Cardiovascular;  Laterality: N/A;   COLONOSCOPY  2013   COLONOSCOPY WITH PROPOFOL N/A 07/11/2021   Procedure: COLONOSCOPY WITH PROPOFOL;  Surgeon: 07/24/2021, MD;  Location: [facility] ENDOSCOPY;  Service: Endoscopy;  Laterality: N/A;   ESOPHAGOGASTRODUODENOSCOPY (EGD) WITH PROPOFOL N/A 03/17/2015   Procedure: ESOPHAGOGASTRODUODENOSCOPY (EGD) WITH PROPOFOL;  Surgeon: 03/30/2015, MD;  Location: [facility] ENDOSCOPY;  Service: Gastroenterology;  Laterality: N/A;   ESOPHAGOGASTRODUODENOSCOPY (EGD) WITH PROPOFOL N/A 07/10/2021   Procedure: ESOPHAGOGASTRODUODENOSCOPY (EGD) WITH PROPOFOL;  Surgeon: 16/03/2022, MD;  Location: [facility] ENDOSCOPY;  Service: Gastroenterology;  Laterality: N/A;   GIVENS CAPSULE STUDY N/A 07/11/2021   Procedure: GIVENS CAPSULE STUDY;  Surgeon: 07/24/2021, MD;  Location: [facility] ENDOSCOPY;  Service: Endoscopy;  Laterality: N/A;   NM MYOVIEW ([place] HX)  07/2015   No evidence ischemia or infarction. EF 66%. Low risk   TONSILLECTOMY     TRANSTHORACIC ECHOCARDIOGRAM  10/2013   Normal LV size and function. EF 55-65%. GR 1 DD. Otherwise normal.    Current Medications: Current Meds  Medication Sig   acetaminophen (TYLENOL) 500 MG tablet Take 500 mg by mouth daily as needed for headache (pain).   albuterol (PROVENTIL HFA;VENTOLIN HFA) 108 (90 Base) MCG/ACT inhaler Inhale 2 puffs into the lungs every 6 (six) hours as needed for wheezing or shortness of breath.   amiodarone (PACERONE) 200 MG tablet TAKE 1 TABLET BY MOUTH DAILY   cholecalciferol (VITAMIN D3) 25 MCG (1000 UNIT) tablet Take 1,000 Units by mouth daily.   fluticasone-salmeterol (ADVAIR HFA) 230-21 MCG/ACT inhaler Inhale 2 puffs into  the lungs 2 (two) times daily.   Glucosamine-Chondroit-Collagen 250-200-116.67 MG CAPS Take 1 tablet by mouth in the morning and at bedtime.   metolazone (ZAROXOLYN) 2.5 MG tablet Take 1 tablet (2.5 mg total) by mouth daily as needed (Metolazone with extra 40 potassium preserved for refractory weight gain (thus only if there is no significant improovement with extra Torsemide)).   mupirocin ointment (BACTROBAN) 2 % Apply 1 application. topically 2 (two) times daily.   nitroGLYCERIN (NITROSTAT) 0.4 MG SL tablet Place 1 tablet (0.4 mg total) under the tongue every 5 (five) minutes as needed.   Polyethyl Glycol-Propyl Glycol (SYSTANE OP) Place 1 drop into both eyes daily as needed (dry eyes).   potassium chloride SA (KLOR-CON M) 20 MEQ tablet TAKE 1 TABLET BY MOUTH TWICE A DAY.   Propylene Glycol (SYSTANE COMPLETE) 0.6 % SOLN Apply 1 drop to eye daily.   torsemide (DEMADEX) 20 MG tablet Take 2 tablets (  40 mg total) by mouth daily.   torsemide 40 MG TABS Take 40 mg by mouth daily as needed (for 3 pound weight gain with extra potassium 40 meq).     Allergies:   Penicillins and Sulfa antibiotics   Social History   Socioeconomic History   Marital status: Widowed    Spouse name: Not on file   Number of children: 2   Years of education: Not on file   Highest education level: Not on file  Occupational History   Not on file  Tobacco Use   Smoking status: Never   Smokeless tobacco: Never  Vaping Use   Vaping Use: Never used  Substance and Sexual Activity   Alcohol use: No    Alcohol/week: 0.0 standard drinks of alcohol   Drug use: No   Sexual activity: Not Currently    Birth control/protection: Post-menopausal  Other Topics Concern   Not on file  Social History Narrative   Not on file   Social Determinants of Health   Financial Resource Strain: Not on file  Food Insecurity: Not on file  Transportation Needs: Not on file  Physical Activity: Not on file  Stress: Not on file  Social  Connections: Not on file     Family History: The patient's family history includes Arthritis in her mother; Breast cancer in an other family member; Heart attack in her daughter, father, and sister; Heart disease in her father; Hypertension in her mother; Osteoporosis in her mother; Stroke in her brother, daughter, and mother. There is no history of Colon cancer.  ROS:   Please see the history of present illness.    All other systems reviewed and are negative.  EKGs/Labs/Other Studies Reviewed:    The following studies were reviewed today:  April 2023 echo EF 60 to 65% RV function normal    Recent Labs: 09/21/2021: TSH 1.33 10/17/2021: B Natriuretic Peptide 296.7 10/19/2021: Magnesium 2.3 11/17/2021: ALT 34; BUN 23; Creatinine, Ser 2.07; Hemoglobin 9.5; Platelets 185.0; Potassium 4.5; Sodium 137  Recent Lipid Panel    Component Value Date/Time   CHOL 129 01/20/2021 0920   TRIG 80.0 01/20/2021 0920   HDL 50.90 01/20/2021 0920   CHOLHDL 3 01/20/2021 0920   VLDL 16.0 01/20/2021 0920   LDLCALC 62 01/20/2021 0920    Physical Exam:    VS:  BP (!) 135/56   Pulse 73   Ht 5' (1.524 m)   Wt 177 lb (80.3 kg)   LMP 05/01/1972   BMI 34.57 kg/m     Wt Readings from Last 3 Encounters:  11/23/21 177 lb (80.3 kg)  11/17/21 172 lb 4.8 oz (78.2 kg)  11/07/21 169 lb 8 oz (76.9 kg)     GEN: Elderly, chronically ill-appearing  HEENT: Normal NECK: No JVD; No carotid bruits LYMPHATICS: No lymphadenopathy CARDIAC: Regular rhythm, no murmurs, rubs, gallops RESPIRATORY:  Clear to auscultation without rales, wheezing or rhonchi  ABDOMEN: Soft, non-tender, non-distended MUSCULOSKELETAL: 3+ pitting bilateral lower extremity pitting edema to the thighs. No deformity  SKIN: Warm and dry NEUROLOGIC:  Alert and oriented x 3 PSYCHIATRIC:  Normal affect       ASSESSMENT:    1. Paroxysmal atrial fibrillation (HCC)   2. Chronic heart failure with preserved ejection fraction ([place])   3.  Gastrointestinal hemorrhage, unspecified gastrointestinal hemorrhage type    PLAN:    In order of problems listed above:  #Acute on chronic diastolic heart failure NYHA class III-IV.  She is very volume overloaded on  exam with at least 3+ pitting bilateral lower extremity edema.  I suspect she has at least 20 to 25 pounds of excess fluid on board.  She has abnormal renal function and has historically had wildly fluctuating electrolyte levels with diuresis.  For these reasons, I have recommended she present to the emergency department for admission to the hospital for IV diuresis while closely monitoring electrolyte and kidney function.  She is very agreeable.  She is getting go home to gather some close before presenting later today to the ER.   #Atrial fibrillation Continue amiodarone.  Maintaining normal rhythm. She has been unable to tolerate anticoagulation of any kind given recurrent GI bleeds without a clear source.  Unfortunately, given the severity of her anemia and GI bleeding, I do not think she is a candidate for even short course anticoagulation.  We discussed how this makes the watchman more risky given the increased probability of device related thrombus.  For these reasons, I do not think she is a good candidate for watchman implant.       Total time spent with patient today 60 minutes. This includes reviewing records, evaluating the patient and coordinating care.  Medication Adjustments/Labs and Tests Ordered: Current medicines are reviewed at length with the patient today.  Concerns regarding medicines are outlined above.  No orders of the defined types were placed in this encounter.  No orders of the defined types were placed in this encounter.    Signed, Hilton Cork. Quentin Ore, MD, [facility], [facility] 11/23/2021 12:13 PM    Electrophysiology [facility]

## 2021-11-23 ENCOUNTER — Other Ambulatory Visit: Payer: Self-pay

## 2021-11-23 ENCOUNTER — Encounter: Payer: Self-pay | Admitting: *Deleted

## 2021-11-23 ENCOUNTER — Inpatient Hospital Stay
Admission: EM | Admit: 2021-11-23 | Discharge: 2021-12-19 | DRG: 673 | Disposition: A | Discharge status: Skilled Nursing Facility | Payer: Medicare Other | Attending: Internal Medicine | Admitting: Internal Medicine

## 2021-11-23 ENCOUNTER — Ambulatory Visit (INDEPENDENT_AMBULATORY_CARE_PROVIDER_SITE_OTHER): Payer: Medicare Other | Admitting: Cardiology

## 2021-11-23 ENCOUNTER — Emergency Department: Payer: Medicare Other

## 2021-11-23 ENCOUNTER — Encounter: Payer: Self-pay | Admitting: Cardiology

## 2021-11-23 VITALS — BP 135/56 | HR 73 | Ht 60.0 in | Wt 177.0 lb

## 2021-11-23 DIAGNOSIS — I509 Heart failure, unspecified: Secondary | ICD-10-CM | POA: Diagnosis not present

## 2021-11-23 DIAGNOSIS — Z66 Do not resuscitate: Secondary | ICD-10-CM | POA: Diagnosis not present

## 2021-11-23 DIAGNOSIS — N186 End stage renal disease: Secondary | ICD-10-CM | POA: Diagnosis not present

## 2021-11-23 DIAGNOSIS — J449 Chronic obstructive pulmonary disease, unspecified: Secondary | ICD-10-CM | POA: Diagnosis present

## 2021-11-23 DIAGNOSIS — K219 Gastro-esophageal reflux disease without esophagitis: Secondary | ICD-10-CM | POA: Diagnosis present

## 2021-11-23 DIAGNOSIS — E7800 Pure hypercholesterolemia, unspecified: Secondary | ICD-10-CM | POA: Diagnosis present

## 2021-11-23 DIAGNOSIS — I482 Chronic atrial fibrillation, unspecified: Secondary | ICD-10-CM | POA: Diagnosis not present

## 2021-11-23 DIAGNOSIS — Z8249 Family history of ischemic heart disease and other diseases of the circulatory system: Secondary | ICD-10-CM

## 2021-11-23 DIAGNOSIS — I25110 Atherosclerotic heart disease of native coronary artery with unstable angina pectoris: Secondary | ICD-10-CM | POA: Diagnosis present

## 2021-11-23 DIAGNOSIS — I5032 Chronic diastolic (congestive) heart failure: Secondary | ICD-10-CM | POA: Diagnosis present

## 2021-11-23 DIAGNOSIS — I953 Hypotension of hemodialysis: Secondary | ICD-10-CM | POA: Diagnosis not present

## 2021-11-23 DIAGNOSIS — N179 Acute kidney failure, unspecified: Secondary | ICD-10-CM | POA: Diagnosis not present

## 2021-11-23 DIAGNOSIS — Z515 Encounter for palliative care: Secondary | ICD-10-CM

## 2021-11-23 DIAGNOSIS — I5033 Acute on chronic diastolic (congestive) heart failure: Secondary | ICD-10-CM | POA: Diagnosis present

## 2021-11-23 DIAGNOSIS — I480 Paroxysmal atrial fibrillation: Secondary | ICD-10-CM | POA: Diagnosis not present

## 2021-11-23 DIAGNOSIS — Z880 Allergy status to penicillin: Secondary | ICD-10-CM

## 2021-11-23 DIAGNOSIS — N184 Chronic kidney disease, stage 4 (severe): Secondary | ICD-10-CM | POA: Diagnosis not present

## 2021-11-23 DIAGNOSIS — I2510 Atherosclerotic heart disease of native coronary artery without angina pectoris: Secondary | ICD-10-CM

## 2021-11-23 DIAGNOSIS — Z9989 Dependence on other enabling machines and devices: Secondary | ICD-10-CM | POA: Diagnosis not present

## 2021-11-23 DIAGNOSIS — E876 Hypokalemia: Secondary | ICD-10-CM | POA: Diagnosis present

## 2021-11-23 DIAGNOSIS — N170 Acute kidney failure with tubular necrosis: Secondary | ICD-10-CM | POA: Diagnosis not present

## 2021-11-23 DIAGNOSIS — I447 Left bundle-branch block, unspecified: Secondary | ICD-10-CM | POA: Diagnosis present

## 2021-11-23 DIAGNOSIS — K7460 Unspecified cirrhosis of liver: Secondary | ICD-10-CM

## 2021-11-23 DIAGNOSIS — D500 Iron deficiency anemia secondary to blood loss (chronic): Secondary | ICD-10-CM | POA: Diagnosis present

## 2021-11-23 DIAGNOSIS — G4733 Obstructive sleep apnea (adult) (pediatric): Secondary | ICD-10-CM | POA: Diagnosis not present

## 2021-11-23 DIAGNOSIS — R600 Localized edema: Secondary | ICD-10-CM | POA: Diagnosis not present

## 2021-11-23 DIAGNOSIS — R188 Other ascites: Secondary | ICD-10-CM | POA: Diagnosis not present

## 2021-11-23 DIAGNOSIS — Z9861 Coronary angioplasty status: Secondary | ICD-10-CM

## 2021-11-23 DIAGNOSIS — K922 Gastrointestinal hemorrhage, unspecified: Secondary | ICD-10-CM

## 2021-11-23 DIAGNOSIS — Z992 Dependence on renal dialysis: Secondary | ICD-10-CM | POA: Diagnosis not present

## 2021-11-23 DIAGNOSIS — Z86010 Personal history of colonic polyps: Secondary | ICD-10-CM

## 2021-11-23 DIAGNOSIS — E782 Mixed hyperlipidemia: Secondary | ICD-10-CM | POA: Diagnosis not present

## 2021-11-23 DIAGNOSIS — E1122 Type 2 diabetes mellitus with diabetic chronic kidney disease: Secondary | ICD-10-CM | POA: Diagnosis present

## 2021-11-23 DIAGNOSIS — Z6836 Body mass index (BMI) 36.0-36.9, adult: Secondary | ICD-10-CM

## 2021-11-23 DIAGNOSIS — I428 Other cardiomyopathies: Secondary | ICD-10-CM | POA: Diagnosis not present

## 2021-11-23 DIAGNOSIS — D696 Thrombocytopenia, unspecified: Secondary | ICD-10-CM

## 2021-11-23 DIAGNOSIS — K7682 Hepatic encephalopathy: Secondary | ICD-10-CM | POA: Diagnosis not present

## 2021-11-23 DIAGNOSIS — I132 Hypertensive heart and chronic kidney disease with heart failure and with stage 5 chronic kidney disease, or end stage renal disease: Secondary | ICD-10-CM | POA: Diagnosis present

## 2021-11-23 DIAGNOSIS — N171 Acute kidney failure with acute cortical necrosis: Secondary | ICD-10-CM | POA: Diagnosis not present

## 2021-11-23 DIAGNOSIS — Z79899 Other long term (current) drug therapy: Secondary | ICD-10-CM

## 2021-11-23 DIAGNOSIS — D631 Anemia in chronic kidney disease: Secondary | ICD-10-CM

## 2021-11-23 DIAGNOSIS — E8770 Fluid overload, unspecified: Secondary | ICD-10-CM | POA: Diagnosis not present

## 2021-11-23 DIAGNOSIS — Z8262 Family history of osteoporosis: Secondary | ICD-10-CM

## 2021-11-23 DIAGNOSIS — I5022 Chronic systolic (congestive) heart failure: Secondary | ICD-10-CM | POA: Diagnosis not present

## 2021-11-23 DIAGNOSIS — I252 Old myocardial infarction: Secondary | ICD-10-CM

## 2021-11-23 DIAGNOSIS — R609 Edema, unspecified: Secondary | ICD-10-CM

## 2021-11-23 DIAGNOSIS — D649 Anemia, unspecified: Secondary | ICD-10-CM | POA: Diagnosis not present

## 2021-11-23 DIAGNOSIS — E871 Hypo-osmolality and hyponatremia: Secondary | ICD-10-CM

## 2021-11-23 DIAGNOSIS — Z20822 Contact with and (suspected) exposure to covid-19: Secondary | ICD-10-CM | POA: Diagnosis present

## 2021-11-23 DIAGNOSIS — I5030 Unspecified diastolic (congestive) heart failure: Secondary | ICD-10-CM | POA: Diagnosis not present

## 2021-11-23 DIAGNOSIS — R4182 Altered mental status, unspecified: Secondary | ICD-10-CM | POA: Diagnosis not present

## 2021-11-23 DIAGNOSIS — I272 Pulmonary hypertension, unspecified: Secondary | ICD-10-CM | POA: Diagnosis present

## 2021-11-23 DIAGNOSIS — M7989 Other specified soft tissue disorders: Secondary | ICD-10-CM | POA: Diagnosis not present

## 2021-11-23 DIAGNOSIS — E785 Hyperlipidemia, unspecified: Secondary | ICD-10-CM | POA: Diagnosis present

## 2021-11-23 DIAGNOSIS — K449 Diaphragmatic hernia without obstruction or gangrene: Secondary | ICD-10-CM | POA: Diagnosis not present

## 2021-11-23 DIAGNOSIS — Z882 Allergy status to sulfonamides status: Secondary | ICD-10-CM

## 2021-11-23 DIAGNOSIS — R140 Abdominal distension (gaseous): Secondary | ICD-10-CM | POA: Diagnosis not present

## 2021-11-23 DIAGNOSIS — G9341 Metabolic encephalopathy: Secondary | ICD-10-CM | POA: Diagnosis not present

## 2021-11-23 DIAGNOSIS — E8809 Other disorders of plasma-protein metabolism, not elsewhere classified: Secondary | ICD-10-CM | POA: Diagnosis present

## 2021-11-23 DIAGNOSIS — Z9049 Acquired absence of other specified parts of digestive tract: Secondary | ICD-10-CM

## 2021-11-23 DIAGNOSIS — R0602 Shortness of breath: Secondary | ICD-10-CM | POA: Diagnosis not present

## 2021-11-23 DIAGNOSIS — I10 Essential (primary) hypertension: Secondary | ICD-10-CM | POA: Diagnosis present

## 2021-11-23 DIAGNOSIS — Z90711 Acquired absence of uterus with remaining cervical stump: Secondary | ICD-10-CM

## 2021-11-23 DIAGNOSIS — I872 Venous insufficiency (chronic) (peripheral): Secondary | ICD-10-CM | POA: Diagnosis present

## 2021-11-23 DIAGNOSIS — M810 Age-related osteoporosis without current pathological fracture: Secondary | ICD-10-CM | POA: Diagnosis present

## 2021-11-23 DIAGNOSIS — N281 Cyst of kidney, acquired: Secondary | ICD-10-CM | POA: Diagnosis not present

## 2021-11-23 DIAGNOSIS — R350 Frequency of micturition: Secondary | ICD-10-CM | POA: Diagnosis not present

## 2021-11-23 DIAGNOSIS — E669 Obesity, unspecified: Secondary | ICD-10-CM | POA: Diagnosis present

## 2021-11-23 LAB — BASIC METABOLIC PANEL
Anion gap: 9 (ref 5–15)
BUN: 31 mg/dL — ABNORMAL HIGH (ref 8–23)
CO2: 24 mmol/L (ref 22–32)
Calcium: 9 mg/dL (ref 8.9–10.3)
Chloride: 105 mmol/L (ref 98–111)
Creatinine, Ser: 2.14 mg/dL — ABNORMAL HIGH (ref 0.44–1.00)
GFR, Estimated: 22 mL/min — ABNORMAL LOW (ref >60)
Glucose, Bld: 123 mg/dL — ABNORMAL HIGH (ref 70–99)
Potassium: 5.2 mmol/L — ABNORMAL HIGH (ref 3.5–5.1)
Sodium: 138 mmol/L (ref 135–145)

## 2021-11-23 LAB — CBC
HCT: 28.5 % — ABNORMAL LOW (ref 36.0–46.0)
HCT: 28.4 % — ABNORMAL LOW (ref 36.0–46.0)
Hemoglobin: 9.4 g/dL — ABNORMAL LOW (ref 12.0–15.0)
Hemoglobin: 9.2 g/dL — ABNORMAL LOW (ref 12.0–15.0)
MCH: 30 pg (ref 26.0–34.0)
MCH: 29.9 pg (ref 26.0–34.0)
MCHC: 33 g/dL (ref 30.0–36.0)
MCHC: 32.4 g/dL (ref 30.0–36.0)
MCV: 91.1 fL (ref 80.0–100.0)
MCV: 92.2 fL (ref 80.0–100.0)
Platelets: 195 10*3/uL (ref 150–400)
Platelets: 194 10*3/uL (ref 150–400)
RBC: 3.13 MIL/uL — ABNORMAL LOW (ref 3.87–5.11)
RBC: 3.08 MIL/uL — ABNORMAL LOW (ref 3.87–5.11)
RDW: 21.4 % — ABNORMAL HIGH (ref 11.5–15.5)
RDW: 21.2 % — ABNORMAL HIGH (ref 11.5–15.5)
WBC: 11.1 10*3/uL — ABNORMAL HIGH (ref 4.0–10.5)
WBC: 11.7 10*3/uL — ABNORMAL HIGH (ref 4.0–10.5)
nRBC: 0 % (ref 0.0–0.2)
nRBC: 0 % (ref 0.0–0.2)

## 2021-11-23 LAB — TROPONIN I (HIGH SENSITIVITY)
Troponin I (High Sensitivity): 18 ng/L — ABNORMAL HIGH (ref <18)
Troponin I (High Sensitivity): 21 ng/L — ABNORMAL HIGH (ref <18)

## 2021-11-23 LAB — CREATININE, SERUM
Creatinine, Ser: 2.12 mg/dL — ABNORMAL HIGH (ref 0.44–1.00)
GFR, Estimated: 23 mL/min — ABNORMAL LOW (ref >60)

## 2021-11-23 MED ORDER — ENOXAPARIN SODIUM 30 MG/0.3ML IJ SOSY
30.0000 mg | PREFILLED_SYRINGE | INTRAMUSCULAR | Status: DC
  Administered 2021-11-23 – 2021-12-08 (×14): 30 mg via SUBCUTANEOUS
  Filled 2021-11-23 (×16): qty 0.3

## 2021-11-23 MED ORDER — AMIODARONE HCL 200 MG PO TABS
200.0000 mg | ORAL_TABLET | Freq: Every day | ORAL | Status: DC
  Administered 2021-11-24 – 2021-12-19 (×26): 200 mg via ORAL
  Filled 2021-11-23 (×26): qty 1

## 2021-11-23 MED ORDER — DOCUSATE SODIUM 100 MG PO CAPS
100.0000 mg | ORAL_CAPSULE | Freq: Two times a day (BID) | ORAL | Status: DC
  Administered 2021-11-23 – 2021-12-17 (×43): 100 mg via ORAL
  Filled 2021-11-23 (×46): qty 1

## 2021-11-23 MED ORDER — FUROSEMIDE 10 MG/ML IJ SOLN
60.0000 mg | Freq: Two times a day (BID) | INTRAMUSCULAR | Status: DC
Start: 2021-11-24 — End: 2021-11-28
  Administered 2021-11-24 – 2021-11-28 (×9): 60 mg via INTRAVENOUS
  Filled 2021-11-23: qty 8
  Filled 2021-11-23 (×8): qty 6

## 2021-11-23 MED ORDER — NITROGLYCERIN 0.4 MG SL SUBL: 0.4000 mg | SUBLINGUAL_TABLET | SUBLINGUAL | Status: DC | PRN

## 2021-11-23 MED ORDER — FUROSEMIDE 10 MG/ML IJ SOLN
60.0000 mg | Freq: Once | INTRAMUSCULAR | Status: AC
  Administered 2021-11-23: 60 mg via INTRAVENOUS
  Filled 2021-11-23: qty 8

## 2021-11-23 NOTE — ED Notes (Signed)
Pt assisted to the [facility] & transitioned to a hospital bed  to promote comfort.

## 2021-11-23 NOTE — ED Notes (Signed)
ED Provider at bedside. 

## 2021-11-23 NOTE — ED Triage Notes (Signed)
Pt to triage via wheelchair.  Pt sent by pmd for eval of swelling legs and sob.  No chest pain. Sx for 3-4 weeks.  Pt alert.

## 2021-11-23 NOTE — Patient Instructions (Signed)
Medications: Your physician recommends that you continue on your current medications as directed. Please refer to the Current Medication list given to you today. *If you need a refill on your cardiac medications before your next appointment, please call your pharmacy*  Lab Work: None. If you have labs (blood work) drawn today and your tests are completely normal, you will receive your results only by: [place] (if you have MyChart) OR A paper copy in the mail If you have any lab test that is abnormal or we need to change your treatment, we will call you to review the results.  Testing/Procedures: None.  Follow-Up: At [facility], you and your health needs are our priority.  As part of our continuing mission to provide you with exceptional heart care, we have created designated Provider Care Teams.  These Care Teams include your primary Cardiologist (physician) and Advanced Practice Providers (APPs -  Physician Assistants and Nurse Practitioners) who all work together to provide you with the care you need, when you need it.  Your physician wants you to follow-up in: Please go to the ER for your extra fluid. You need IV medication to get the fluid off.  We recommend signing up for the patient portal called "MyChart".  Sign up information is provided on this After Visit Summary.  MyChart is used to connect with patients for Virtual Visits (Telemedicine).  Patients are able to view lab/test results, encounter notes, upcoming appointments, etc.  Non-urgent messages can be sent to your provider as well.   To learn more about what you can do with MyChart, go to [email or web address].    Any Other Special Instructions Will Be Listed Below (If Applicable).

## 2021-11-23 NOTE — ED Provider Triage Note (Signed)
Emergency Medicine Provider Triage Evaluation Note  April [name], a 81 y.o. female  was evaluated in triage.  Pt complains of shortness of breath and increased leg swelling.  Patient with a history of CHF, presents from her PCPs office.  She denies any frank chest pain.  She reports 3 to 4 weeks of progressive symptoms.  Review of Systems  Positive: BLE, SOb Negative: CP, FCS  Physical Exam  BP (!) 136/53 (BP Location: Left Arm)   Pulse 73   Temp 99.2 F (37.3 C) (Oral)   Resp 18   Ht 5' (1.524 m)   Wt 80.3 kg   LMP 05/01/1972   SpO2 98%   BMI 34.57 kg/m  Gen:   Awake, no distress  NAD Resp:  Normal effort  MSK:   Moves extremities without difficulty. Significant  BLE edema Other:    Medical Decision Making  Medically screening exam initiated at 3:26 PM.  Appropriate orders placed.  [name] was informed that the remainder of the evaluation will be completed by another provider, this initial triage assessment does not replace that evaluation, and the importance of remaining in the ED until their evaluation is complete.  Patient with a history of A-fib, anemia, cirrhosis, and CHF, presents to the ED for persistent fluid retention, BLE edema edema, and shortness of breath.   Melvenia Needles, PA-C 11/23/21 1527

## 2021-11-23 NOTE — H&P (Signed)
History and Physical    April [name] [id number]:[id number] DOB: February 05, 1941 DOA: 11/23/2021  PCP: Einar Pheasant, MD  Patient coming from: Home.  Chief Complaint: Increasing peripheral edema.  HPI: April [name] is a 81 y.o. female with with history of chronic diastolic CHF last EF measured in April 2023 was 60 to 65% with grade 2 diastolic dysfunction, chronic and disease stage IV, anemia, sleep apnea, CAD status post tenting, history of paroxysmal atrial fibrillation not on anticoagulation secondary to recurrent GI bleed was referred to the ER by patient's cardiologist after patient was found to have increasing peripheral edema.  Patient states over the last 1 week patient has gained at least 4 pounds and has been having increasing peripheral edema.  Patient had gone to her nephrologist 2 days ago and her torsemide was increased from 20 twice daily and it is now 40 twice daily which patient has been taking for last 2 days.  ED Course: In the ER patient is not in distress appears volume overloaded with significant bilateral lower extremity edema.  Chest x-ray unremarkable EKG not showing anything acute.  High sensitive troponins were 18 and 21.  Patient's labs are largely at baseline.  Admitted for further work-up.  Patient was given Lasix 60 mg IV in the ER.  Review of Systems: As per HPI, rest all negative.   Past Medical History:  Diagnosis Date   Anemia    CAD S/P PCI pRCA Promus DES 3.5 x 16 (4.1 mm), ostRPDA Promus DES 2.5 x 12 (2.7 mm) 03/25/2016   CAD S/P percutaneous coronary angioplasty    a. 07/2015 MV: No ischemia, EF 66%;  b. 03/2016 Inflat STEMI/PCI: LM nl, LAD 25d, RI nl, LCX nl, OM1/2 nl, RCA 80p (3.5x16 Promus Premier DES), 50p/m, 30d, RPDA 90 (2.5x12 Promu Premier DES); c. 01/2018 Cath ([place], [place]): Patent RCA stents->Med Rx.   CHF (congestive heart failure) ([place])    CKD (chronic kidney disease), stage III ([place])    Diabetes mellitus without complication ([place])     Diastolic dysfunction    a. 10/2013 Echo: EF 55-65%, Gr1 DD; b. 01/2018 Echo ([place], [place]): EF 55-60%, Gr2 DD, RVSP 56mHg.   Diverticulitis    Dyspnea    Endometriosis    Family history of adverse reaction to anesthesia    Mother - had to stay in ICU because of breathing difficulties every time she had anesthesia   GERD (gastroesophageal reflux disease)    GI bleed    a. 06/2021 - melena-->2 u PRBCs.   Hiatal hernia    History of colon polyps 08/1994   History of Migraines    Hypercholesterolemia    Hypertension    LBBB (left bundle branch block)    Osteoporosis    PAF (paroxysmal atrial fibrillation) ([place]    a. 03/2016 Dx @ time of MI; b. CHA2DS2VASc = 5-->Eliquis.   Rheumatic fever    Sleep apnea    CPAP   Spasmodic dysphonia    ST elevation myocardial infarction (STEMI) of inferolateral wall, [place] 03/25/2016   ST elevation myocardial infarction (STEMI) of inferolateral wall, initial episode of care ([facility] 03/25/2016   Urine incontinence     Past Surgical History:  Procedure Laterality Date   ABDOMINAL HYSTERECTOMY     ovaries not removed   APPENDECTOMY     was removed during hysterectomy   Breast biopsies     x2   BREAST EXCISIONAL BIOPSY Bilateral "years  ago"   neg   BREAST SURGERY Bilateral    BROW LIFT Bilateral 08/15/2019   Procedure: BLEPHAROPLASTY UPPER EYELID; W/EXCESS SKIN BLEPHAROPTOSIS REPAIR; RESECT EX;  Surgeon: Karle Starch, MD;  Location: [place];  Service: Ophthalmology;  Laterality: Bilateral;  sleep apnea   CARDIAC CATHETERIZATION  2011   moderate 40% RCA disease   CARDIAC CATHETERIZATION  01/2010   Dr. Gollan'@[facility]'$ : Only noted 40% RCA   CARDIAC CATHETERIZATION N/A 03/25/2016   Procedure: Left Heart Cath and Coronary Angiography;  Surgeon: 04/07/2016, MD;  Location: [place] CV LAB;  Service: Cardiovascular;  Laterality: N/A;   CARDIAC CATHETERIZATION N/A 03/25/2016   Procedure:  Coronary Stent Intervention;  Surgeon: 04/07/2016, MD;  Location: [place] CV LAB;  Service: Cardiovascular;  Laterality: N/A;   COLONOSCOPY  2013   COLONOSCOPY WITH PROPOFOL N/A 07/11/2021   Procedure: COLONOSCOPY WITH PROPOFOL;  Surgeon: 07/24/2021, MD;  Location: [facility] ENDOSCOPY;  Service: Endoscopy;  Laterality: N/A;   ESOPHAGOGASTRODUODENOSCOPY (EGD) WITH PROPOFOL N/A 03/17/2015   Procedure: ESOPHAGOGASTRODUODENOSCOPY (EGD) WITH PROPOFOL;  Surgeon: 03/30/2015, MD;  Location: [facility] ENDOSCOPY;  Service: Gastroenterology;  Laterality: N/A;   ESOPHAGOGASTRODUODENOSCOPY (EGD) WITH PROPOFOL N/A 07/10/2021   Procedure: ESOPHAGOGASTRODUODENOSCOPY (EGD) WITH PROPOFOL;  Surgeon: 16/03/2022, MD;  Location[facility] ENDOSCOPY;  Service: Gastroenterology;  Laterality: N/A;   GIVENS CAPSULE STUDY N/A 07/11/2021   Procedure: GIVENS CAPSULE STUDY;  Surgeon: 07/24/2021, MD;  Location: [facility] ENDOSCOPY;  Service: Endoscopy;  Laterality: N/A;   NM MYOVIEW ([place] HX)  07/2015   No evidence ischemia or infarction. EF 66%. Low risk   TONSILLECTOMY     TRANSTHORACIC ECHOCARDIOGRAM  10/2013   Normal LV size and function. EF 55-65%. GR 1 DD. Otherwise normal.     reports that she has never smoked. She has never used smokeless tobacco. She reports that she does not drink alcohol and does not use drugs.  Allergies  Allergen Reactions   Penicillins Other (See Comments)    Okay to take amoxicillin/(pt does not recall what the reaction to penicillin was (43-7 years old)    Sulfa Antibiotics Rash    Family History  Problem Relation Age of Onset   Arthritis Mother    Stroke Mother    Hypertension Mother    Osteoporosis Mother    Heart disease Father        MI   Heart attack Father    Stroke Brother    Heart attack Sister    Breast cancer Other        first cousin x 2   Stroke Daughter    Heart attack Daughter    Colon cancer Neg Hx     Prior to Admission medications    Medication Sig Start Date End Date Taking? Authorizing Provider  albuterol (PROVENTIL HFA;VENTOLIN HFA) 108 (90 Base) MCG/ACT inhaler Inhale 2 puffs into the lungs every 6 (six) hours as needed for wheezing or shortness of breath. 04/22/18  Yes Kasa, 05/05/18, MD  amiodarone (PACERONE) 200 MG tablet TAKE 1 TABLET BY MOUTH DAILY 09/27/21  Yes Gollan, 10/10/21, MD  cholecalciferol (VITAMIN D3) 25 MCG (1000 UNIT) tablet Take 1,000 Units by mouth 2 (two) times daily.   Yes [provider]  Docusate Calcium (STOOL SOFTENER PO) Take 1 tablet by mouth 2 (two) times daily.   Yes [provider]  Glucosamine-Chondroit-Collagen [phone] MG CAPS Take 1 tablet by mouth in the morning and at bedtime.  Yes [provider]  potassium chloride SA (KLOR-CON M) 20 MEQ tablet TAKE 1 TABLET BY MOUTH TWICE A DAY. 10/31/21  Yes Einar Pheasant, MD  torsemide 40 MG TABS Take 40 mg by mouth daily as needed (for 3 pound weight gain with extra potassium 40 meq). 10/21/21  Yes Jennye Boroughs, MD  acetaminophen (TYLENOL) 500 MG tablet Take 500 mg by mouth daily as needed for headache (pain).    [provider]  fluticasone-salmeterol (ADVAIR HFA) 230-21 MCG/ACT inhaler Inhale 2 puffs into the lungs 2 (two) times daily. 10/20/20   Flora Lipps, MD  metolazone (ZAROXOLYN) 2.5 MG tablet Take 1 tablet (2.5 mg total) by mouth daily as needed (Metolazone with extra 40 potassium preserved for refractory weight gain (thus only if there is no significant improovement with extra Torsemide)). Patient not taking: Reported on 11/23/2021 10/21/21   Jennye Boroughs, MD  mupirocin ointment (BACTROBAN) 2 % Apply 1 application. topically 2 (two) times daily. Patient not taking: Reported on 11/23/2021    [provider]  nitroGLYCERIN (NITROSTAT) 0.4 MG SL tablet Place 1 tablet (0.4 mg total) under the tongue every 5 (five) minutes as needed. 04/22/18   Minna Merritts, MD  Polyethyl Glycol-Propyl  Glycol (SYSTANE OP) Place 1 drop into both eyes daily as needed (dry eyes).    [provider]  Propylene Glycol (SYSTANE COMPLETE) 0.6 % SOLN Apply 1 drop to eye daily.    [provider]  torsemide (DEMADEX) 20 MG tablet Take 2 tablets (40 mg total) by mouth daily. Patient not taking: Reported on 11/23/2021 09/08/21   Antony Madura, PA-C    Physical Exam: Constitutional: Moderately built and nourished. Vitals:   11/23/21 1512 11/23/21 1738 11/23/21 1900  BP: (!) 136/53 (!) 155/64 (!) 145/93  Pulse: 73 74 77  Resp: '18 18 20  '$ Temp: 99.2 F (37.3 C) 97.9 F (36.6 C)   TempSrc: Oral Oral   SpO2: 98% 100% 100%  Weight: 80.3 kg    Height: 5' (1.524 m)     Eyes: Anicteric no pallor. ENMT: No discharge from the ears eyes nose and mouth. Neck: JVD elevated no mass felt. Respiratory: No rhonchi or crepitations. Cardiovascular: S1-S2 heard. Abdomen: Soft nontender bowel sound present. Musculoskeletal: Bilateral lower extremity edema present. Skin: No rash. Neurologic: Alert awake oriented time place and person.  Moves all extremities. Psychiatric: Appears normal.  Normal affect.   Labs on Admission: I have personally reviewed following labs and imaging studies  CBC: Recent Labs  Lab 11/17/21 1546 11/23/21 1514 11/23/21 1931  WBC 10.7* 11.1* 11.7*  NEUTROABS 6.9  --   --   HGB 9.5* 9.4* 9.2*  HCT 28.3* 28.5* 28.4*  MCV 90.7 91.1 92.2  PLT 185.0 195 825   Basic Metabolic Panel: Recent Labs  Lab 11/17/21 1546 11/23/21 1514 11/23/21 1931  NA 137 138  --   K 4.5 5.2*  --   CL 100 105  --   CO2 24 24  --   GLUCOSE 139* 123*  --   BUN 23 31*  --   CREATININE 2.07* 2.14* 2.12*  CALCIUM 8.8 9.0  --    GFR: Estimated Creatinine Clearance: 18.9 mL/min (A) (by C-G formula based on SCr of 2.12 mg/dL (H)). Liver Function Tests: Recent Labs  Lab 11/17/21 1546  AST 73*  ALT 34  ALKPHOS 127*  BILITOT 1.7*  PROT 6.1  ALBUMIN 2.8*   No results for  input(s): "LIPASE", "AMYLASE" in the last  168 hours. No results for input(s): "AMMONIA" in the last 168 hours. Coagulation Profile: No results for input(s): "INR", "PROTIME" in the last 168 hours. Cardiac Enzymes: No results for input(s): "CKTOTAL", "CKMB", "CKMBINDEX", "TROPONINI" in the last 168 hours. BNP (last 3 results) No results for input(s): "PROBNP" in the last 8760 hours. HbA1C: No results for input(s): "HGBA1C" in the last 72 hours. CBG: No results for input(s): "GLUCAP" in the last 168 hours. Lipid Profile: No results for input(s): "CHOL", "HDL", "LDLCALC", "TRIG", "CHOLHDL", "LDLDIRECT" in the last 72 hours. Thyroid Function Tests: No results for input(s): "TSH", "T4TOTAL", "FREET4", "T3FREE", "THYROIDAB" in the last 72 hours. Anemia Panel: No results for input(s): "VITAMINB12", "FOLATE", "FERRITIN", "TIBC", "IRON", "RETICCTPCT" in the last 72 hours. Urine analysis:    Component Value Date/Time   COLORURINE STRAW (A) 10/17/2021 2030   APPEARANCEUR CLEAR (A) 10/17/2021 2030   LABSPEC 1.005 10/17/2021 2030   PHURINE 6.0 10/17/2021 2030   GLUCOSEU NEGATIVE 10/17/2021 2030   GLUCOSEU NEGATIVE 12/15/2016 1117   HGBUR MODERATE (A) 10/17/2021 2030   BILIRUBINUR NEGATIVE 10/17/2021 2030   [place] 10/17/2021 2030   PROTEINUR 30 (A) 10/17/2021 2030   UROBILINOGEN 0.2 12/15/2016 1117   NITRITE NEGATIVE 10/17/2021 2030   LEUKOCYTESUR NEGATIVE 10/17/2021 2030   Sepsis Labs: '@LABRCNTIP'$ (procalcitonin:4,lacticidven:4) )No results found for this or any previous visit (from the past 240 hour(s)).   Radiological Exams on Admission: DG Chest 2 View  Result Date: 11/23/2021 CLINICAL DATA:  Shortness of breath, leg swelling EXAM: CHEST - 2 VIEW COMPARISON:  10/20/2021 FINDINGS: Cardiac size at the upper limit of normal. Aortic atherosclerosis. No focal pulmonary opacity. No pleural effusion or pneumothorax. Moderate hiatal hernia. No acute osseous abnormality. IMPRESSION:  No acute cardiopulmonary process. Electronically Signed   By: Merilyn Baba M.D.   On: 11/23/2021 15:40    EKG: Independently reviewed.  Normal sinus rhythm low voltage.  Assessment/Plan Principal Problem:   Acute on chronic diastolic CHF (congestive heart failure) (HCC) Active Problems:   CAD S/P PCI pRCA Promus DES 3.5 x 16 (4.1 mm), ostRPDA Promus DES 2.5 x 12 (2.7 mm)   Paroxysmal atrial fibrillation (HCC)   Essential hypertension, benign   OSA on CPAP   Acute CHF (congestive heart failure) (HCC)    Acute on chronic diastolic CHF last EF measured in April 2023 was 60 to 65% with grade 2 diastolic dysfunction.  Patient has gained at least 4 pounds over the last 1 week.  Patient's torsemide dose was recently increased.  We will keep patient on Lasix 60 mg IV every 12 closely follow intake output metabolic panel daily weights.  Not on ACE inhibitors or ARB due to renal failure. Sleep apnea on CPAP at bedtime. CAD status post stent placement denies any chest pain. Chronic kidney disease stage IV creatinine appears to be at baseline.  Recently was admitted for hypokalemia. 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 Paroxysmal atrial fibrillation not on anticoagulation due to recurrent GI bleed.  On amiodarone. Hemoglobin A1c 2 months ago was 6.3.  Patient states she is not a diabetic.   DVT prophylaxis: Lovenox. Code Status: Full code. Family Communication: Discussed with patient. Disposition Plan: Home. Consults called: None. Admission status: Observation.   Rise Patience MD [facility] Pager [phone].  If 7PM-7AM, please contact night-coverage [email or web address] Password [facility]  11/23/2021, 8:04 PM

## 2021-11-24 DIAGNOSIS — K7460 Unspecified cirrhosis of liver: Secondary | ICD-10-CM | POA: Diagnosis present

## 2021-11-24 DIAGNOSIS — R600 Localized edema: Secondary | ICD-10-CM | POA: Diagnosis not present

## 2021-11-24 DIAGNOSIS — I5033 Acute on chronic diastolic (congestive) heart failure: Secondary | ICD-10-CM | POA: Diagnosis present

## 2021-11-24 DIAGNOSIS — D696 Thrombocytopenia, unspecified: Secondary | ICD-10-CM | POA: Diagnosis not present

## 2021-11-24 DIAGNOSIS — N179 Acute kidney failure, unspecified: Secondary | ICD-10-CM | POA: Diagnosis present

## 2021-11-24 DIAGNOSIS — N281 Cyst of kidney, acquired: Secondary | ICD-10-CM | POA: Diagnosis not present

## 2021-11-24 DIAGNOSIS — G9341 Metabolic encephalopathy: Secondary | ICD-10-CM | POA: Diagnosis not present

## 2021-11-24 DIAGNOSIS — E7800 Pure hypercholesterolemia, unspecified: Secondary | ICD-10-CM | POA: Diagnosis present

## 2021-11-24 DIAGNOSIS — I2510 Atherosclerotic heart disease of native coronary artery without angina pectoris: Secondary | ICD-10-CM | POA: Diagnosis not present

## 2021-11-24 DIAGNOSIS — I5030 Unspecified diastolic (congestive) heart failure: Secondary | ICD-10-CM | POA: Diagnosis not present

## 2021-11-24 DIAGNOSIS — N184 Chronic kidney disease, stage 4 (severe): Secondary | ICD-10-CM | POA: Diagnosis not present

## 2021-11-24 DIAGNOSIS — G4733 Obstructive sleep apnea (adult) (pediatric): Secondary | ICD-10-CM | POA: Diagnosis not present

## 2021-11-24 DIAGNOSIS — R188 Other ascites: Secondary | ICD-10-CM | POA: Diagnosis present

## 2021-11-24 DIAGNOSIS — R4182 Altered mental status, unspecified: Secondary | ICD-10-CM | POA: Diagnosis not present

## 2021-11-24 DIAGNOSIS — I10 Essential (primary) hypertension: Secondary | ICD-10-CM | POA: Diagnosis not present

## 2021-11-24 DIAGNOSIS — I272 Pulmonary hypertension, unspecified: Secondary | ICD-10-CM | POA: Diagnosis present

## 2021-11-24 DIAGNOSIS — I25110 Atherosclerotic heart disease of native coronary artery with unstable angina pectoris: Secondary | ICD-10-CM | POA: Diagnosis present

## 2021-11-24 DIAGNOSIS — I5022 Chronic systolic (congestive) heart failure: Secondary | ICD-10-CM | POA: Diagnosis not present

## 2021-11-24 DIAGNOSIS — K7682 Hepatic encephalopathy: Secondary | ICD-10-CM | POA: Diagnosis not present

## 2021-11-24 DIAGNOSIS — E8809 Other disorders of plasma-protein metabolism, not elsewhere classified: Secondary | ICD-10-CM | POA: Diagnosis present

## 2021-11-24 DIAGNOSIS — Z9861 Coronary angioplasty status: Secondary | ICD-10-CM | POA: Diagnosis not present

## 2021-11-24 DIAGNOSIS — J449 Chronic obstructive pulmonary disease, unspecified: Secondary | ICD-10-CM | POA: Diagnosis present

## 2021-11-24 DIAGNOSIS — Z66 Do not resuscitate: Secondary | ICD-10-CM | POA: Diagnosis present

## 2021-11-24 DIAGNOSIS — E782 Mixed hyperlipidemia: Secondary | ICD-10-CM | POA: Diagnosis not present

## 2021-11-24 DIAGNOSIS — I480 Paroxysmal atrial fibrillation: Secondary | ICD-10-CM | POA: Diagnosis not present

## 2021-11-24 DIAGNOSIS — I509 Heart failure, unspecified: Secondary | ICD-10-CM | POA: Diagnosis not present

## 2021-11-24 DIAGNOSIS — D500 Iron deficiency anemia secondary to blood loss (chronic): Secondary | ICD-10-CM | POA: Diagnosis present

## 2021-11-24 DIAGNOSIS — K219 Gastro-esophageal reflux disease without esophagitis: Secondary | ICD-10-CM | POA: Diagnosis present

## 2021-11-24 DIAGNOSIS — I482 Chronic atrial fibrillation, unspecified: Secondary | ICD-10-CM | POA: Diagnosis not present

## 2021-11-24 DIAGNOSIS — I132 Hypertensive heart and chronic kidney disease with heart failure and with stage 5 chronic kidney disease, or end stage renal disease: Secondary | ICD-10-CM | POA: Diagnosis present

## 2021-11-24 DIAGNOSIS — Z992 Dependence on renal dialysis: Secondary | ICD-10-CM | POA: Diagnosis not present

## 2021-11-24 DIAGNOSIS — E876 Hypokalemia: Secondary | ICD-10-CM | POA: Diagnosis not present

## 2021-11-24 DIAGNOSIS — Z9989 Dependence on other enabling machines and devices: Secondary | ICD-10-CM | POA: Diagnosis not present

## 2021-11-24 DIAGNOSIS — E8770 Fluid overload, unspecified: Secondary | ICD-10-CM | POA: Diagnosis not present

## 2021-11-24 DIAGNOSIS — N186 End stage renal disease: Secondary | ICD-10-CM | POA: Diagnosis present

## 2021-11-24 DIAGNOSIS — I428 Other cardiomyopathies: Secondary | ICD-10-CM | POA: Diagnosis not present

## 2021-11-24 DIAGNOSIS — R140 Abdominal distension (gaseous): Secondary | ICD-10-CM | POA: Diagnosis not present

## 2021-11-24 DIAGNOSIS — E1122 Type 2 diabetes mellitus with diabetic chronic kidney disease: Secondary | ICD-10-CM | POA: Diagnosis present

## 2021-11-24 DIAGNOSIS — I953 Hypotension of hemodialysis: Secondary | ICD-10-CM | POA: Diagnosis not present

## 2021-11-24 DIAGNOSIS — E871 Hypo-osmolality and hyponatremia: Secondary | ICD-10-CM | POA: Diagnosis not present

## 2021-11-24 DIAGNOSIS — D649 Anemia, unspecified: Secondary | ICD-10-CM | POA: Diagnosis not present

## 2021-11-24 DIAGNOSIS — I5032 Chronic diastolic (congestive) heart failure: Secondary | ICD-10-CM | POA: Diagnosis present

## 2021-11-24 DIAGNOSIS — N170 Acute kidney failure with tubular necrosis: Secondary | ICD-10-CM | POA: Diagnosis not present

## 2021-11-24 DIAGNOSIS — Z515 Encounter for palliative care: Secondary | ICD-10-CM | POA: Diagnosis not present

## 2021-11-24 DIAGNOSIS — N171 Acute kidney failure with acute cortical necrosis: Secondary | ICD-10-CM | POA: Diagnosis not present

## 2021-11-24 DIAGNOSIS — K449 Diaphragmatic hernia without obstruction or gangrene: Secondary | ICD-10-CM | POA: Diagnosis not present

## 2021-11-24 DIAGNOSIS — D631 Anemia in chronic kidney disease: Secondary | ICD-10-CM | POA: Diagnosis present

## 2021-11-24 DIAGNOSIS — Z6836 Body mass index (BMI) 36.0-36.9, adult: Secondary | ICD-10-CM | POA: Diagnosis not present

## 2021-11-24 DIAGNOSIS — Z20822 Contact with and (suspected) exposure to covid-19: Secondary | ICD-10-CM | POA: Diagnosis present

## 2021-11-24 LAB — CBC
HCT: 25.4 % — ABNORMAL LOW (ref 36.0–46.0)
Hemoglobin: 8.2 g/dL — ABNORMAL LOW (ref 12.0–15.0)
MCH: 29.6 pg (ref 26.0–34.0)
MCHC: 32.3 g/dL (ref 30.0–36.0)
MCV: 91.7 fL (ref 80.0–100.0)
Platelets: 169 10*3/uL (ref 150–400)
RBC: 2.77 MIL/uL — ABNORMAL LOW (ref 3.87–5.11)
RDW: 21.3 % — ABNORMAL HIGH (ref 11.5–15.5)
WBC: 9.7 10*3/uL (ref 4.0–10.5)
nRBC: 0 % (ref 0.0–0.2)

## 2021-11-24 LAB — BASIC METABOLIC PANEL
Anion gap: 8 (ref 5–15)
BUN: 32 mg/dL — ABNORMAL HIGH (ref 8–23)
CO2: 25 mmol/L (ref 22–32)
Calcium: 8.5 mg/dL — ABNORMAL LOW (ref 8.9–10.3)
Chloride: 106 mmol/L (ref 98–111)
Creatinine, Ser: 2.01 mg/dL — ABNORMAL HIGH (ref 0.44–1.00)
GFR, Estimated: 24 mL/min — ABNORMAL LOW (ref >60)
Glucose, Bld: 100 mg/dL — ABNORMAL HIGH (ref 70–99)
Potassium: 3.8 mmol/L (ref 3.5–5.1)
Sodium: 139 mmol/L (ref 135–145)

## 2021-11-24 LAB — BRAIN NATRIURETIC PEPTIDE: B Natriuretic Peptide: 368.4 pg/mL — ABNORMAL HIGH (ref 0.0–100.0)

## 2021-11-24 LAB — TSH: TSH: 1.482 u[IU]/mL (ref 0.350–4.500)

## 2021-11-24 LAB — MAGNESIUM: Magnesium: 2.2 mg/dL (ref 1.7–2.4)

## 2021-11-24 MED ORDER — POTASSIUM CHLORIDE CRYS ER 20 MEQ PO TBCR
40.0000 meq | EXTENDED_RELEASE_TABLET | Freq: Once | ORAL | Status: AC
  Administered 2021-11-24: 40 meq via ORAL
  Filled 2021-11-24 (×2): qty 2

## 2021-11-24 NOTE — ED Notes (Signed)
Pts personal BiPAP set up.

## 2021-11-24 NOTE — Consult Note (Addendum)
   Heart Failure Nurse Navigator Note  HFpEF 60 to 65%.  Grade 2 diastolic dysfunction.  Right ventricle is mildly enlarged.  Newly elevated pulmonary artery systolic pressures.  Mild biatrial enlargement.  Mild mitral regurgitation.  Moderate tricuspid regurgitation.  She presented to the emergency room after being seen by cardiology with worsening lower extremity edema, abdominal distention and shortness of breath.  Comorbidities:  Atrial fibrillation Anemia Cirrhosis GI bleed not on NOAC Coronary artery disease with stenting Diabetes GERD Anemia Obstructive sleep apnea Hypertension Hyperlipidemia not on lipid-lowering medication due to cirrhosis(patient discontinued)  Medications:  Amiodarone 200 mg daily Furosemide 60 mg IV every 12 hours  Labs:  BNP 368, sodium 139, potassium 3.8, chloride 106, CO2 25, BUN 32, creatinine 2.01, TSH 1.48, magnesium 2.2, hemoglobin 8.2, hematocrit 25.4, yesterday's hemoglobin 9.2 Weight is 80.3 kg Blood pressure 105/62  Initial meeting with patient in the emergency room.  She is lying in bed, in no acute distress, no conversational dyspnea noted.  She gets a meal once a day from the facility, she felt that it does not taste salty.    Discussed her fluid restriction, she states she had recently been cut down to approximately 30 mL daily.  States that she has 32 ounce size cup that she feels with ice and  another 12 ounces of liquid.  She states that continues to live in her own little cottage, she no longer goes to the dining hall has her meal delivered.  She states that she had been weighing herself daily and had only seen her weight increased by 4 pounds.  She then goes on to state but she had seen worsening symptoms of abdominal distention with her pants fitting tight and worsening lower extremity edema.  Discussed being more cognizant of changes in her symptoms and notifying the heart failure clinic with those changes.  Discussed other  options and staying out of the hospital but she was not interested in having Kristi with para medicine visit as she already has PT OT and home health coming in once a week.  Also discussed the option of being followed with [facility] health but patient again refuses as she has in the past.  She also voices concern over now being diagnosed as having cirrhosis.  She states that" I was not a drinker."  Question if this is not due to her heart failure.  She decided on her own to discontinue her cholesterol medication.  She had no further questions.  We will continue to follow along she has an appointment in the outpatient heart failure clinic on July 31 at 11 AM.  She has a 4% no-show which is 8 out of 227 appointments.  Pricilla Riffle RN [facility]

## 2021-11-24 NOTE — ED Notes (Signed)
Pt assisted to the BSC.

## 2021-11-24 NOTE — Progress Notes (Signed)
PROGRESS NOTE    April [name]  WVP:[id number] DOB: [birth year] DOA: 11/23/2021 PCP: Einar Pheasant, MD    Brief Narrative:  81 y.o. female with with history of chronic diastolic CHF last EF measured in April 2023 was 55 to 65% with grade 2 diastolic dysfunction, chronic and disease stage IV, anemia, sleep apnea, CAD status post tenting, history of paroxysmal atrial fibrillation not on anticoagulation secondary to recurrent GI bleed was referred to the ER by patient's cardiologist after patient was found to have increasing peripheral edema.  Patient states over the last 1 week patient has gained at least 4 pounds and has been having increasing peripheral edema.  Patient had gone to her nephrologist 2 days ago and her torsemide was increased from 20 twice daily and it is now 40 twice daily which patient has been taking for last 2 days.   Assessment & Plan:   Principal Problem:   Acute on chronic diastolic CHF (congestive heart failure) (HCC) Active Problems:   CAD S/P PCI pRCA Promus DES 3.5 x 16 (4.1 mm), ostRPDA Promus DES 2.5 x 12 (2.7 mm)   Paroxysmal atrial fibrillation (HCC)   Essential hypertension, benign   OSA on CPAP   Acute CHF (congestive heart failure) (HCC)  Acute on chronic diastolic congestive heart failure EF in April 2023 5462%, grade 2 diastolic dysfunction 4 pound weight gain in the last 1 week Torsemide dosing recently increased Was sent in by outpatient cardiology Plan: Continue Lasix 60 mg IV twice daily Target net -1-1.5 L daily Daily BMP Monitor and replace electrolytes aggressively No ACE/ARB due to renal failure  Obstructive sleep apnea CPAP nightly  Coronary disease status post PCI No chest pain, no concern for ACS at this time Does not appear to be on any antiplatelet agents Defer to outpatient cardiology SL nitro as needed chest pain  Paroxysmal atrial fibrillation Currently in sinus rhythm Unable to tolerate anticoagulation due to history  of GI bleed without source Continue amiodarone Outpatient cardiology follow-up  Chronic kidney disease stage IV Creatinine at or near baseline Daily BMP  DVT prophylaxis: SQ Lovenox Code Status: Full Family Communication: None today.  Offered to call but patient declined Disposition Plan: Status is: Observation The patient will require care spanning > 2 midnights and should be moved to inpatient because: Decompensated heart failure on IV diuretic   Level of care: Telemetry Cardiac  Consultants:  None  Procedures:  None  Antimicrobials: None   Subjective: Seen and examined.  Resting in bed.  No visible distress.  No conversational dyspnea.  No pain complaints.  Objective: Vitals:   11/24/21 0900 11/24/21 1000 11/24/21 1100 11/24/21 1105  BP: (!) 112/46 (!) 113/51    Pulse: 74 71 71 68  Resp: 20 (!) 24 (!) 23 18  Temp:    (!) 96.7 F (35.9 C)  TempSrc:    Oral  SpO2: 92% 97% 99% 100%  Weight:      Height:        Intake/Output Summary (Last 24 hours) at 11/24/2021 1132 Last data filed at 11/24/2021 1025 Gross per 24 hour  Intake --  Output 1500 ml  Net -1500 ml   Filed Weights   11/23/21 1512  Weight: 80.3 kg    Examination:  General exam: NAD Respiratory system: Bibasilar crackles.  Normal work of breathing.  Room air Cardiovascular system: S1-S2, RRR, no murmurs, 3+ pitting edema BLE Gastrointestinal system: Soft, NT/ND, normal bowel sounds Central nervous system: Alert and oriented. No  focal neurological deficits. Extremities: Symmetric 5 x 5 power. Skin: No rashes, lesions or ulcers Psychiatry: Judgement and insight appear normal. Mood & affect appropriate.     Data Reviewed: I have personally reviewed following labs and imaging studies  CBC: Recent Labs  Lab 11/17/21 1546 11/23/21 1514 11/23/21 1931 11/24/21 0505  WBC 10.7* 11.1* 11.7* 9.7  NEUTROABS 6.9  --   --   --   HGB 9.5* 9.4* 9.2* 8.2*  HCT 28.3* 28.5* 28.4* 25.4*  MCV 90.7  91.1 92.2 91.7  PLT 185.0 195 194 678   Basic Metabolic Panel: Recent Labs  Lab 11/17/21 1546 11/23/21 1514 11/23/21 1931 11/24/21 0505  NA 137 138  --  139  K 4.5 5.2*  --  3.8  CL 100 105  --  106  CO2 24 24  --  25  GLUCOSE 139* 123*  --  100*  BUN 23 31*  --  32*  CREATININE 2.07* 2.14* 2.12* 2.01*  CALCIUM 8.8 9.0  --  8.5*  MG  --   --   --  2.2   GFR: Estimated Creatinine Clearance: 19.9 mL/min (A) (by C-G formula based on SCr of 2.01 mg/dL (H)). Liver Function Tests: Recent Labs  Lab 11/17/21 1546  AST 73*  ALT 34  ALKPHOS 127*  BILITOT 1.7*  PROT 6.1  ALBUMIN 2.8*   No results for input(s): "LIPASE", "AMYLASE" in the last 168 hours. No results for input(s): "AMMONIA" in the last 168 hours. Coagulation Profile: No results for input(s): "INR", "PROTIME" in the last 168 hours. Cardiac Enzymes: No results for input(s): "CKTOTAL", "CKMB", "CKMBINDEX", "TROPONINI" in the last 168 hours. BNP (last 3 results) No results for input(s): "PROBNP" in the last 8760 hours. HbA1C: No results for input(s): "HGBA1C" in the last 72 hours. CBG: No results for input(s): "GLUCAP" in the last 168 hours. Lipid Profile: No results for input(s): "CHOL", "HDL", "LDLCALC", "TRIG", "CHOLHDL", "LDLDIRECT" in the last 72 hours. Thyroid Function Tests: Recent Labs    11/24/21 0505  TSH 1.482   Anemia Panel: No results for input(s): "VITAMINB12", "FOLATE", "FERRITIN", "TIBC", "IRON", "RETICCTPCT" in the last 72 hours. Sepsis Labs: No results for input(s): "PROCALCITON", "LATICACIDVEN" in the last 168 hours.  No results found for this or any previous visit (from the past 240 hour(s)).       Radiology Studies: DG Chest 2 View  Result Date: 11/23/2021 CLINICAL DATA:  Shortness of breath, leg swelling EXAM: CHEST - 2 VIEW COMPARISON:  10/20/2021 FINDINGS: Cardiac size at the upper limit of normal. Aortic atherosclerosis. No focal pulmonary opacity. No pleural effusion or  pneumothorax. Moderate hiatal hernia. No acute osseous abnormality. IMPRESSION: No acute cardiopulmonary process. Electronically Signed   By: Merilyn Baba M.D.   On: 11/23/2021 15:40        Scheduled Meds:  amiodarone  200 mg Oral Daily   docusate sodium  100 mg Oral BID   enoxaparin (LOVENOX) injection  30 mg Subcutaneous Q24H   furosemide  60 mg Intravenous Q12H   Continuous Infusions:   LOS: 0 days    Sidney Ace, MD [facility]   If 7PM-7AM, please contact night-coverage  11/24/2021, 11:32 AM

## 2021-11-25 DIAGNOSIS — I5033 Acute on chronic diastolic (congestive) heart failure: Secondary | ICD-10-CM | POA: Diagnosis not present

## 2021-11-25 LAB — BASIC METABOLIC PANEL
Anion gap: 7 (ref 5–15)
BUN: 32 mg/dL — ABNORMAL HIGH (ref 8–23)
CO2: 24 mmol/L (ref 22–32)
Calcium: 8.2 mg/dL — ABNORMAL LOW (ref 8.9–10.3)
Chloride: 106 mmol/L (ref 98–111)
Creatinine, Ser: 2.04 mg/dL — ABNORMAL HIGH (ref 0.44–1.00)
GFR, Estimated: 24 mL/min — ABNORMAL LOW (ref >60)
Glucose, Bld: 80 mg/dL (ref 70–99)
Potassium: 3.8 mmol/L (ref 3.5–5.1)
Sodium: 137 mmol/L (ref 135–145)

## 2021-11-25 NOTE — Progress Notes (Signed)
PROGRESS NOTE    April [name]  BDZ:[id number] DOB: April 14, 1941 DOA: 11/23/2021 PCP: Einar Pheasant, MD    Brief Narrative:   81 y.o. female with with history of chronic diastolic CHF last EF measured in April 2023 was 35 to 65% with grade 2 diastolic dysfunction, chronic and disease stage IV, anemia, sleep apnea, CAD status post tenting, history of paroxysmal atrial fibrillation not on anticoagulation secondary to recurrent GI bleed was referred to the ER by patient's cardiologist after patient was found to have increasing peripheral edema.  Patient states over the last 1 week patient has gained at least 4 pounds and has been having increasing peripheral edema.  Patient had gone to her nephrologist 2 days ago and her torsemide was increased from 20 twice daily and it is now 40 twice daily which patient has been taking for last 2 days.   Assessment & Plan:   Principal Problem:   Acute on chronic diastolic CHF (congestive heart failure) (HCC) Active Problems:   CAD S/P PCI pRCA Promus DES 3.5 x 16 (4.1 mm), ostRPDA Promus DES 2.5 x 12 (2.7 mm)   Paroxysmal atrial fibrillation (HCC)   Essential hypertension, benign   OSA on CPAP   Acute CHF (congestive heart failure) (HCC)   Acute decompensated heart failure (HCC)  Acute on chronic diastolic congestive heart failure EF in April 2023 3419%, grade 2 diastolic dysfunction 4 pound weight gain in the last 1 week Torsemide dosing recently increased Was sent in by outpatient cardiology Net -1.7 L since admission Plan: Continue Lasix 60 mg IV twice daily Target net negative 1.5-2 L daily Daily BMP Monitor and replace electrolytes aggressively No ACE/ARB due to renal failure Close monitoring of electrolytes and renal function  Obstructive sleep apnea CPAP nightly  Coronary disease status post PCI No chest pain, no concern for ACS at this time Does not appear to be on any antiplatelet agents Defer to outpatient cardiology SL nitro  as needed chest pain  Paroxysmal atrial fibrillation Currently in sinus rhythm Unable to tolerate anticoagulation due to history of GI bleed without source Continue amiodarone Outpatient cardiology follow-up  Chronic kidney disease stage IV Creatinine at or near baseline Daily BMP  DVT prophylaxis: SQ Lovenox Code Status: Full Family Communication: None today.  Offered to call but patient declined Disposition Plan: Status is: Inpatient Remains inpatient appropriate because: Acute decompensated heart failure on IV diuretic  Level of care: Telemetry Cardiac  Consultants:  None  Procedures:  None  Antimicrobials: None   Subjective: Seen and examined.  Sitting comfortably in bed.  No visible distress.  No conversational dyspnea.  No pain Plans  Objective: Vitals:   11/25/21 0417 11/25/21 0500 11/25/21 0756 11/25/21 1216  BP: (!) 113/43  (!) 124/55 (!) 111/48  Pulse: 69  69 71  Resp: '19  18 17  '$ Temp: 98.3 F (36.8 C)  97.9 F (36.6 C) 97.8 F (36.6 C)  TempSrc: Oral  Oral   SpO2: 94%  97% 100%  Weight:  80.7 kg    Height:        Intake/Output Summary (Last 24 hours) at 11/25/2021 1339 Last data filed at 11/25/2021 1215 Gross per 24 hour  Intake --  Output 400 ml  Net -400 ml   Filed Weights   11/23/21 1512 11/25/21 0500  Weight: 80.3 kg 80.7 kg    Examination:  General exam: No acute distress Respiratory system: Bibasilar crackles.  Normal work of breathing.  Room air Cardiovascular system: S1-S2,  RRR, no murmurs, 2+ pitting edema BLE Gastrointestinal system: Soft, NT/ND, normal bowel sounds Central nervous system: Alert and oriented. No focal neurological deficits. Extremities: Symmetric 5 x 5 power. Skin: No rashes, lesions or ulcers Psychiatry: Judgement and insight appear normal. Mood & affect appropriate.     Data Reviewed: I have personally reviewed following labs and imaging studies  CBC: Recent Labs  Lab 11/23/21 1514 11/23/21 1931  11/24/21 0505  WBC 11.1* 11.7* 9.7  HGB 9.4* 9.2* 8.2*  HCT 28.5* 28.4* 25.4*  MCV 91.1 92.2 91.7  PLT 195 194 381   Basic Metabolic Panel: Recent Labs  Lab 11/23/21 1514 11/23/21 1931 11/24/21 0505 11/25/21 0439  NA 138  --  139 137  K 5.2*  --  3.8 3.8  CL 105  --  106 106  CO2 24  --  25 24  GLUCOSE 123*  --  100* 80  BUN 31*  --  32* 32*  CREATININE 2.14* 2.12* 2.01* 2.04*  CALCIUM 9.0  --  8.5* 8.2*  MG  --   --  2.2  --    GFR: Estimated Creatinine Clearance: 19.7 mL/min (A) (by C-G formula based on SCr of 2.04 mg/dL (H)). Liver Function Tests: No results for input(s): "AST", "ALT", "ALKPHOS", "BILITOT", "PROT", "ALBUMIN" in the last 168 hours.  No results for input(s): "LIPASE", "AMYLASE" in the last 168 hours. No results for input(s): "AMMONIA" in the last 168 hours. Coagulation Profile: No results for input(s): "INR", "PROTIME" in the last 168 hours. Cardiac Enzymes: No results for input(s): "CKTOTAL", "CKMB", "CKMBINDEX", "TROPONINI" in the last 168 hours. BNP (last 3 results) No results for input(s): "PROBNP" in the last 8760 hours. HbA1C: No results for input(s): "HGBA1C" in the last 72 hours. CBG: No results for input(s): "GLUCAP" in the last 168 hours. Lipid Profile: No results for input(s): "CHOL", "HDL", "LDLCALC", "TRIG", "CHOLHDL", "LDLDIRECT" in the last 72 hours. Thyroid Function Tests: Recent Labs    11/24/21 0505  TSH 1.482   Anemia Panel: No results for input(s): "VITAMINB12", "FOLATE", "FERRITIN", "TIBC", "IRON", "RETICCTPCT" in the last 72 hours. Sepsis Labs: No results for input(s): "PROCALCITON", "LATICACIDVEN" in the last 168 hours.  No results found for this or any previous visit (from the past 240 hour(s)).       Radiology Studies: DG Chest 2 View  Result Date: 11/23/2021 CLINICAL DATA:  Shortness of breath, leg swelling EXAM: CHEST - 2 VIEW COMPARISON:  10/20/2021 FINDINGS: Cardiac size at the upper limit of normal. Aortic  atherosclerosis. No focal pulmonary opacity. No pleural effusion or pneumothorax. Moderate hiatal hernia. No acute osseous abnormality. IMPRESSION: No acute cardiopulmonary process. Electronically Signed   By: Merilyn Baba M.D.   On: 11/23/2021 15:40        Scheduled Meds:  amiodarone  200 mg Oral Daily   docusate sodium  100 mg Oral BID   enoxaparin (LOVENOX) injection  30 mg Subcutaneous Q24H   furosemide  60 mg Intravenous Q12H   Continuous Infusions:   LOS: 1 day    Sidney Ace, MD [facility]   If 7PM-7AM, please contact night-coverage  11/25/2021, 1:39 PM

## 2021-11-25 NOTE — Evaluation (Signed)
Physical Therapy Evaluation Patient Details Name: April [name] MRN: [id number] DOB: 1940-12-05 Today's Date: 11/25/2021  History of Present Illness  Pt is an 81 y.o. female presenting to hospital 7/26 with increasing peripheral edema.  Pt admitted with acute on chronic diastolic CHF.  PMH includes h/o chronic diastolic CHF, anemia, sleep apnea, CAD s/p tenting, h/o paroxysmal a-fib (not on anticoagulation secondary to recurrent GI bleed), CKD, spasmodic dysphonia, STEMI, urine incontinence, B breast surgery.  Clinical Impression  Prior to hospital admission, pt was modified independent with functional mobility (using SPC and holding onto furniture as needed in home; using RW vs rollator in community); recently receiving HHPT and [place]; lives alone in [place] housing.  Currently pt is SBA to CGA with transfers; CGA to ambulate short distances in room (using [facility] and holding onto furniture as needed); and CGA to ambulate around nursing loop with RW use.  Pt appearing safer and steadier ambulating with RW (compared to SPC)--encouraged pt to use RW d/t improved balance/safety.  Pt would benefit from skilled PT to address noted impairments and functional limitations (see below for any additional details).  Upon hospital discharge, pt would benefit from continued HHPT.    Recommendations for follow up therapy are one component of a multi-disciplinary discharge planning process, led by the attending physician.  Recommendations may be updated based on patient status, additional functional criteria and insurance authorization.  Follow Up Recommendations Home health PT      Assistance Recommended at Discharge Set up Supervision/Assistance  Patient can return home with the following  A little help with walking and/or transfers;A little help with bathing/dressing/bathroom;Assistance with cooking/housework;Assist for transportation;Help with stairs or ramp for entrance    Equipment  Recommendations Rolling walker (2 wheels);BSC/3in1  Recommendations for Other Services       Functional Status Assessment Patient has had a recent decline in their functional status and demonstrates the ability to make significant improvements in function in a reasonable and predictable amount of time.     Precautions / Restrictions Precautions Precautions: Fall Restrictions Weight Bearing Restrictions: No      Mobility  Bed Mobility               General bed mobility comments: Deferred (pt sitting in recliner beginning/end of session)    Transfers Overall transfer level: Needs assistance Equipment used: Rolling walker (2 wheels), Straight cane Transfers: Sit to/from Stand, Bed to chair/wheelchair/BSC Sit to Stand: Supervision (x1 trial from recliner; x2 trials from chair with B armrests; x1 trial from toilet with grab bar use)   Step pivot transfers: Min guard (to toilet x1 trial and to chair x2 trials)       General transfer comment: mild increased effort to stand with at least single UE support for balance    Ambulation/Gait Ambulation/Gait assistance: Min guard Gait Distance (Feet):  (25 feet +20 feet with SPC; 200 feet with RW) Assistive device: Straight cane, Rolling walker (2 wheels)   Gait velocity: decreased     General Gait Details: pt mildly unsteady ambulating with SPC intermittently holding onto furniture in room for balance (pt tripped with cane stepping into bathroom requiring CGA for safety); steady ambulating with RW use  Stairs            Wheelchair Mobility    Modified Rankin (Stroke Patients Only)       Balance Overall balance assessment: Needs assistance Sitting-balance support: No upper extremity supported, Feet supported Sitting balance-Leahy Scale: Normal Sitting balance -  Comments: steady sitting reaching outside BOS   Standing balance support: Bilateral upper extremity supported, During functional activity, Reliant on  assistive device for balance Standing balance-Leahy Scale: Good Standing balance comment: steady ambulating with RW; mildly unsteady ambulating with SPC (intermittently holding onto furniture in room for balance)                             Pertinent Vitals/Pain Pain Assessment Pain Assessment: No/denies pain Vitals (HR and O2 on room air) stable and WFL throughout treatment session.    Home Living Family/patient expects to be discharged to:: Private residence Living Arrangements: Alone Available Help at Discharge: Family;Available PRN/intermittently (son checks in on pt) Type of Home: House Home Access: Level entry       Home Layout: One level Home Equipment: Grab bars - tub/shower;Rolling Walker (2 wheels);Rollator (4 wheels);Cane - single point;Shower seat Additional Comments: Apartment at [employer] at [place] Prior Level of Function : Independent/Modified Independent             Mobility Comments: Modified independent ambulating with SPC in home (while holding onto furniture as needed) and using RW vs rollator in community.       Hand Dominance        Extremity/Trunk Assessment   Upper Extremity Assessment Upper Extremity Assessment: Generalized weakness    Lower Extremity Assessment Lower Extremity Assessment: Generalized weakness    Cervical / Trunk Assessment Cervical / Trunk Assessment: Other exceptions Cervical / Trunk Exceptions: forward head/shoulders  Communication   Communication: No difficulties  Cognition Arousal/Alertness: Awake/alert Behavior During Therapy: WFL for tasks assessed/performed Overall Cognitive Status: Within Functional Limits for tasks assessed                                          General Comments  Nursing cleared pt for participation in physical therapy.  Pt agreeable to PT session.  Pt's son present during session.    Exercises     Assessment/Plan    PT Assessment  Patient needs continued PT services  PT Problem List Decreased strength;Decreased activity tolerance;Decreased balance;Decreased mobility;Decreased knowledge of use of DME       PT Treatment Interventions DME instruction;Gait training;Functional mobility training;Therapeutic activities;Therapeutic exercise;Balance training;Patient/family education    PT Goals (Current goals can be found in the Care Plan section)  Acute Rehab PT Goals Patient Stated Goal: to go home PT Goal Formulation: With patient Time For Goal Achievement: 12/09/21 Potential to Achieve Goals: Good    Frequency Min 2X/week     Co-evaluation               AM-PAC PT "6 Clicks" Mobility  Outcome Measure Help needed turning from your back to your side while in a flat bed without using bedrails?: None Help needed moving from lying on your back to sitting on the side of a flat bed without using bedrails?: None Help needed moving to and from a bed to a chair (including a wheelchair)?: A Little Help needed standing up from a chair using your arms (e.g., wheelchair or bedside chair)?: A Little Help needed to walk in hospital room?: A Little Help needed climbing 3-5 steps with a railing? : A Little 6 Click Score: 20    End of Session Equipment Utilized During Treatment: Gait belt Activity Tolerance: Patient tolerated treatment well  Patient left: in chair;with call bell/phone within reach;with family/visitor present;Other (comment) (B LE's elevated with heels floating via pillow support) Nurse Communication: Mobility status;Precautions PT Visit Diagnosis: Unsteadiness on feet (R26.81);Muscle weakness (generalized) (M62.81);Other abnormalities of gait and mobility (R26.89)    Time: 3202-3343 PT Time Calculation (min) (ACUTE ONLY): 22 min   Charges:   PT Evaluation $PT Eval Low Complexity: 1 Low PT Treatments $Therapeutic Exercise: 8-22 mins       Leitha Bleak, PT 11/25/21, 4:58 PM

## 2021-11-25 NOTE — Progress Notes (Addendum)
   Heart Failure Nurse Navigator Note  Met with patient today.  She was sitting up in the recliner.  On room air, no conversational SOB noted.  Went over how she takes care of herself at home.  Weighs daily, sticks with fluid restriction, does not add salt, does not use any other type of salt - such as sea, pink or [place]. If food she gets from the facilities dining room tastes salty, she does not eat it.   Went over changes in signs and symptoms to report.   Discussed follow up in the outpatient heart failure clinic- she has an appointment for August 25 at 1 PM.  She had not further questions or concerns.  Pricilla Riffle RN [facility]

## 2021-11-25 NOTE — TOC Initial Note (Signed)
Transition of Care [facility]) - Initial/Assessment Note    Patient Details  Name: MARIALICE NEWKIRK MRN: [id number] Date of Birth: [birth year]  Transition of Care [facility]) CM/SW Contact:    Laurena Slimmer, RN Phone Number: 11/25/2021, 2:07 PM  Clinical Narrative:                 Patient is active with [facility] for RN, PT per Floydene Flock         Patient Goals and CMS Choice        Expected Discharge Plan and Services                                                Prior Living Arrangements/Services                       Activities of Daily Living   ADL Screening (condition at time of admission) Patient's cognitive ability adequate to safely complete daily activities?: Yes Is the patient deaf or have difficulty hearing?: No Does the patient have difficulty seeing, even when wearing glasses/contacts?: No Does the patient have difficulty concentrating, remembering, or making decisions?: No Patient able to express need for assistance with ADLs?: Yes Does the patient have difficulty dressing or bathing?: No Independently performs ADLs?: Yes (appropriate for developmental age) Does the patient have difficulty walking or climbing stairs?: No Weakness of Legs: None Weakness of Arms/Hands: None  Permission Sought/Granted                  Emotional Assessment              Admission diagnosis:  Acute CHF (congestive heart failure) ([place]) [I50.9] Acute decompensated heart failure ([place]) [I50.9] Patient Active Problem List   Diagnosis Date Noted   Acute decompensated heart failure ([place]) 11/24/2021   Acute CHF (congestive heart failure) ([place]) 11/23/2021   Weakness 10/29/2021   Acute respiratory failure with hypoxia ([place]) 10/18/2021   History of recurrent GI bleed on apixaban 10/17/2021   History of adverse reaction to anticoagulant medication 10/17/2021   Electrolyte abnormality 10/17/2021   Acute on chronic congestive heart failure ([place])     Hypokalemia 08/11/2021   Acute on chronic diastolic CHF (congestive heart failure) ([place]) 08/08/2021   Chronic obstructive pulmonary disease, unspecified COPD type ([place]) 08/01/2021   Iron deficiency anemia    Gastric erythema    Symptomatic anemia    GI bleeding 07/07/2021   Thrombophilia ([place]) 04/10/2021   Aortic atherosclerosis ([place]) 01/03/2021   Cellulitis of leg, left 10/26/2020   Breast tenderness 07/14/2020   Abnormal liver function tests 03/11/2020   Breast pain 12/15/2018   Lymphedema 07/09/2018   Chronic heart failure with preserved ejection fraction ([place]) 06/03/2018   Acute renal failure superimposed on stage 3b chronic kidney disease ([place]) 06/03/2018   Lower extremity edema 01/19/2018   Arthropathy of lumbar facet joint 08/08/2017   Degeneration of lumbar intervertebral disc 08/08/2017   Osteoarthritis of knee 08/08/2017   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   Paroxysmal atrial fibrillation ([place]) 03/27/2016   CAD S/P PCI pRCA Promus DES 3.5 x 16 (4.1 mm), ostRPDA Promus DES 2.5 x 12 (2.7 mm) 03/25/2016   Right knee pain 11/13/2015   Coronary artery disease involving native coronary artery of native heart with unstable angina pectoris ([place]) 06/01/2015   Diabetes mellitus ([place]) 05/16/2015  Gastritis 04/01/2015   Chest pain 04/01/2015   Hiatal hernia 08/25/2014   DOE (dyspnea on exertion) 07/15/2014   Congestion of throat 07/06/2014   Health care maintenance 07/06/2014   Fibrocystic breast disease 07/06/2014   BMI 38.0-38.9,adult 04/19/2014   Stress 02/16/2013   Rib pain on right side 02/16/2013   Acute on Chronic dyspnea 10/13/2012   GERD (gastroesophageal reflux disease) 10/13/2012   Essential hypertension, benign 08/18/2012   Hypercholesterolemia 08/18/2012   History of colonic polyps 08/18/2012   OSA on CPAP 08/18/2012   Osteoporosis 08/18/2012   PCP:  Einar Pheasant, MD Pharmacy:   [facility] [place] [address] Phone: [phone] Fax: [place], [place] - [address] [place] [address] Phone: [phone] Fax: [place], [place] [address] Phone: [phone] Fax: [phone]     Social Determinants of Health (SDOH) Interventions    Readmission Risk Interventions    10/19/2021   12:27 PM  Readmission Risk Prevention Plan  Transportation Screening Complete  PCP or Specialist Appt within 3-5 Days Complete  HRI or [place] Complete  Social Work Consult for [place] Planning/Counseling Complete  Palliative Care Screening Complete  Medication Review [employer]) Complete

## 2021-11-26 DIAGNOSIS — I5033 Acute on chronic diastolic (congestive) heart failure: Secondary | ICD-10-CM | POA: Diagnosis not present

## 2021-11-26 LAB — BASIC METABOLIC PANEL
Anion gap: 3 — ABNORMAL LOW (ref 5–15)
BUN: 34 mg/dL — ABNORMAL HIGH (ref 8–23)
CO2: 29 mmol/L (ref 22–32)
Calcium: 8.2 mg/dL — ABNORMAL LOW (ref 8.9–10.3)
Chloride: 104 mmol/L (ref 98–111)
Creatinine, Ser: 1.93 mg/dL — ABNORMAL HIGH (ref 0.44–1.00)
GFR, Estimated: 25 mL/min — ABNORMAL LOW (ref >60)
Glucose, Bld: 86 mg/dL (ref 70–99)
Potassium: 4 mmol/L (ref 3.5–5.1)
Sodium: 136 mmol/L (ref 135–145)

## 2021-11-26 NOTE — Progress Notes (Signed)
PROGRESS NOTE    April [name]  UXN:[id number] DOB: 06/04/1940 DOA: 11/23/2021 PCP: Einar Pheasant, MD    Brief Narrative:    81 y.o. female with with history of chronic diastolic CHF last EF measured in April 2023 was 44 to 65% with grade 2 diastolic dysfunction, chronic and disease stage IV, anemia, sleep apnea, CAD status post tenting, history of paroxysmal atrial fibrillation not on anticoagulation secondary to recurrent GI bleed was referred to the ER by patient's cardiologist after patient was found to have increasing peripheral edema.  Patient states over the last 1 week patient has gained at least 4 pounds and has been having increasing peripheral edema.  Patient had gone to her nephrologist 2 days ago and her torsemide was increased from 20 twice daily and it is now 40 twice daily which patient has been taking for last 2 days.   Assessment & Plan:   Principal Problem:   Acute on chronic diastolic CHF (congestive heart failure) (HCC) Active Problems:   CAD S/P PCI pRCA Promus DES 3.5 x 16 (4.1 mm), ostRPDA Promus DES 2.5 x 12 (2.7 mm)   Paroxysmal atrial fibrillation (HCC)   Essential hypertension, benign   OSA on CPAP   Acute CHF (congestive heart failure) (HCC)   Acute decompensated heart failure (HCC)  Acute on chronic diastolic congestive heart failure EF in April 2023 2542%, grade 2 diastolic dysfunction 4 pound weight gain in the last 1 week Torsemide dosing recently increased Was sent in by outpatient cardiology Net -1.7 L since admission Plan: Accurate monitoring of urinary output Continue Lasix 60 mg IV twice daily Target net negative 1.5-2 L daily Daily BMP Monitor and replace electrolytes aggressively No ACE/ARB due to renal failure Close monitoring of electrolytes and renal function  Obstructive sleep apnea Home CPAP nightly  Coronary disease status post PCI No chest pain, no concern for ACS at this time Does not appear to be on any antiplatelet  agents Defer to outpatient cardiology SL nitro as needed chest pain  Paroxysmal atrial fibrillation Currently in sinus rhythm Unable to tolerate anticoagulation due to history of GI bleed without source Continue amiodarone Outpatient cardiology follow-up  Chronic kidney disease stage IV Creatinine at or near baseline Daily BMP  DVT prophylaxis: SQ Lovenox Code Status: Full Family Communication: None today.  Offered to call but patient declined Disposition Plan: Status is: Inpatient Remains inpatient appropriate because: Acute decompensated heart failure on IV diuretic  Level of care: Telemetry Cardiac  Consultants:  None  Procedures:  None  Antimicrobials: None   Subjective: Seen and examined.  Resting comfortably in bed.  No visible distress.  No pain complaints.  No dyspnea noted.  Objective: Vitals:   11/26/21 0452 11/26/21 0500 11/26/21 0715 11/26/21 1048  BP: (!) 97/36  (!) 119/44 (!) 130/56  Pulse: 66  69 65  Resp: '20  19 17  '$ Temp: 98.2 F (36.8 C)  98.8 F (37.1 C) 98.1 F (36.7 C)  TempSrc: Oral     SpO2: 93%  94% 99%  Weight:  81.6 kg    Height:        Intake/Output Summary (Last 24 hours) at 11/26/2021 1240 Last data filed at 11/25/2021 2033 Gross per 24 hour  Intake 360 ml  Output 0 ml  Net 360 ml   Filed Weights   11/23/21 1512 11/25/21 0500 11/26/21 0500  Weight: 80.3 kg 80.7 kg 81.6 kg    Examination:  General exam: No acute distress Respiratory system: Bibasilar  crackles.  Normal work of breathing.  Room air Cardiovascular system: S1-S2, RRR, no murmurs, 2+ pitting edema BLE Gastrointestinal system: Soft, NT/ND, normal bowel sounds Central nervous system: Alert and oriented. No focal neurological deficits. Extremities: Symmetric 5 x 5 power. Skin: No rashes, lesions or ulcers Psychiatry: Judgement and insight appear normal. Mood & affect appropriate.     Data Reviewed: I have personally reviewed following labs and imaging  studies  CBC: Recent Labs  Lab 11/23/21 1514 11/23/21 1931 11/24/21 0505  WBC 11.1* 11.7* 9.7  HGB 9.4* 9.2* 8.2*  HCT 28.5* 28.4* 25.4*  MCV 91.1 92.2 91.7  PLT 195 194 469   Basic Metabolic Panel: Recent Labs  Lab 11/23/21 1514 11/23/21 1931 11/24/21 0505 11/25/21 0439 11/26/21 0749  NA 138  --  139 137 136  K 5.2*  --  3.8 3.8 4.0  CL 105  --  106 106 104  CO2 24  --  '25 24 29  '$ GLUCOSE 123*  --  100* 80 86  BUN 31*  --  32* 32* 34*  CREATININE 2.14* 2.12* 2.01* 2.04* 1.93*  CALCIUM 9.0  --  8.5* 8.2* 8.2*  MG  --   --  2.2  --   --    GFR: Estimated Creatinine Clearance: 20.9 mL/min (A) (by C-G formula based on SCr of 1.93 mg/dL (H)). Liver Function Tests: No results for input(s): "AST", "ALT", "ALKPHOS", "BILITOT", "PROT", "ALBUMIN" in the last 168 hours.  No results for input(s): "LIPASE", "AMYLASE" in the last 168 hours. No results for input(s): "AMMONIA" in the last 168 hours. Coagulation Profile: No results for input(s): "INR", "PROTIME" in the last 168 hours. Cardiac Enzymes: No results for input(s): "CKTOTAL", "CKMB", "CKMBINDEX", "TROPONINI" in the last 168 hours. BNP (last 3 results) No results for input(s): "PROBNP" in the last 8760 hours. HbA1C: No results for input(s): "HGBA1C" in the last 72 hours. CBG: No results for input(s): "GLUCAP" in the last 168 hours. Lipid Profile: No results for input(s): "CHOL", "HDL", "LDLCALC", "TRIG", "CHOLHDL", "LDLDIRECT" in the last 72 hours. Thyroid Function Tests: Recent Labs    11/24/21 0505  TSH 1.482   Anemia Panel: No results for input(s): "VITAMINB12", "FOLATE", "FERRITIN", "TIBC", "IRON", "RETICCTPCT" in the last 72 hours. Sepsis Labs: No results for input(s): "PROCALCITON", "LATICACIDVEN" in the last 168 hours.  No results found for this or any previous visit (from the past 240 hour(s)).       Radiology Studies: No results found.      Scheduled Meds:  amiodarone  200 mg Oral Daily    docusate sodium  100 mg Oral BID   enoxaparin (LOVENOX) injection  30 mg Subcutaneous Q24H   furosemide  60 mg Intravenous Q12H   Continuous Infusions:   LOS: 2 days    Sidney Ace, MD [facility]   If 7PM-7AM, please contact night-coverage  11/26/2021, 12:40 PM

## 2021-11-26 NOTE — Evaluation (Signed)
Occupational Therapy Evaluation Patient Details Name: April [name] MRN: [id number] DOB: 1940-07-09 Today's Date: 11/26/2021   History of Present Illness Pt is an 81 y.o. female presenting to hospital 7/26 with increasing peripheral edema.  Pt admitted with acute on chronic diastolic CHF.  PMH includes h/o chronic diastolic CHF, anemia, sleep apnea, CAD s/p tenting, h/o paroxysmal a-fib (not on anticoagulation secondary to recurrent GI bleed), CKD, spasmodic dysphonia, STEMI, urine incontinence, B breast surgery.   Clinical Impression   Pt seen this date for OT evaluation.  Pt lives independently at the [place] in a cottage, one level and has a walker, rollator, cane, comfort height toilet and grab bars in her shower.  [facility] provides assistance one time a week for housekeeping.  Pt reports she has had multiple hospital admissions over the past few months and feels weak overall.  She presents with muscle weakness, decreased activity tolerance, decreased ability to perform basic self care acutely due to increased edema in lower extremities, and decreased ability to perform IADL tasks.  She has her meals delivered from the kitchen at [facility] currently and has not been participating in cooking for a couple months.  She would benefit from skilled OT services to maximize her safety and independence in necessary daily tasks.  Recommend follow up [place] upon discharge.      Recommendations for follow up therapy are one component of a multi-disciplinary discharge planning process, led by the attending physician.  Recommendations may be updated based on patient status, additional functional criteria and insurance authorization.   Follow Up Recommendations  Home health OT    Assistance Recommended at Discharge Intermittent Supervision/Assistance  Patient can return home with the following Assistance with cooking/housework;A little help with bathing/dressing/bathroom    Functional Status  Assessment  Patient has had a recent decline in their functional status and demonstrates the ability to make significant improvements in function in a reasonable and predictable amount of time.  Equipment Recommendations  None recommended by OT    Recommendations for Other Services       Precautions / Restrictions Precautions Precautions: Fall      Mobility Bed Mobility               General bed mobility comments: Deferred (pt sitting in recliner beginning/end of session)    Transfers Overall transfer level: Needs assistance Equipment used: Rolling walker (2 wheels), Straight cane Transfers: Sit to/from Stand, Bed to chair/wheelchair/BSC Sit to Stand: Supervision     Step pivot transfers: Min guard            Balance Overall balance assessment: Needs assistance Sitting-balance support: No upper extremity supported, Feet supported Sitting balance-Leahy Scale: Normal     Standing balance support: Bilateral upper extremity supported, During functional activity, Reliant on assistive device for balance Standing balance-Leahy Scale: Good                             ADL either performed or assessed with clinical judgement   ADL Overall ADL's : Needs assistance/impaired Eating/Feeding: Modified independent   Grooming: Standing;Supervision/safety   Upper Body Bathing: Set up   Lower Body Bathing: Minimal assistance Lower Body Bathing Details (indicate cue type and reason): increased edema in legs and feet Upper Body Dressing : Modified independent   Lower Body Dressing: Minimal assistance Lower Body Dressing Details (indicate cue type and reason): difficulty with lower body dressing due to increased edema in legs and  feet, requires increased time and effort to perform Toilet Transfer: Supervision/safety   Toileting- Clothing Manipulation and Hygiene: Supervision/safety       Functional mobility during ADLs: Supervision/safety;Rolling walker (2  wheels)       Vision Baseline Vision/History: 1 Wears glasses       Perception     Praxis      Pertinent Vitals/Pain Pain Assessment Pain Assessment: 0-10 Pain Score: 0-No pain     Hand Dominance Right   Extremity/Trunk Assessment Upper Extremity Assessment Upper Extremity Assessment: Generalized weakness   Lower Extremity Assessment Lower Extremity Assessment: Defer to PT evaluation   Cervical / Trunk Assessment Cervical / Trunk Exceptions: forward head/shoulders   Communication Communication Communication: No difficulties   Cognition Arousal/Alertness: Awake/alert Behavior During Therapy: WFL for tasks assessed/performed Overall Cognitive Status: Within Functional Limits for tasks assessed                                       General Comments   Pt able to ambulate in room with cane and supervision to sink and back.  Difficulty with donning and doffing socks due to increased edema in lower extremities.  She would benefit from additional instruction on energy conservation as well as adaptive equipment for lower body self care.     Exercises     Shoulder Instructions      Home Living Family/patient expects to be discharged to:: Private residence Living Arrangements: Alone Available Help at Discharge: Family;Available PRN/intermittently Type of Home: House Home Access: Level entry     Home Layout: One level     Bathroom Shower/Tub: Occupational psychologist: Standard     Home Equipment: Grab bars - tub/shower;Rolling Environmental consultant (2 wheels);Rollator (4 wheels);Cane - single point;Shower seat   Additional Comments: 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 [place]      Prior Functioning/Environment Prior Level of Function : Independent/Modified Independent             Mobility Comments: Modified independent ambulating with SPC in home (while holding onto furniture as needed) and using RW vs rollator in community. ADLs Comments: pt MOD I in  ADLs; intermittent assist with IADLs.  She reports multiple admissions to the hospital since March with progressive weakness and has been doing less cooking and cleaning over this time.        OT Problem List: Decreased strength;Decreased activity tolerance;Decreased knowledge of use of DME or AE;Increased edema;Decreased range of motion;Pain      OT Treatment/Interventions: Self-care/ADL training;Energy conservation;Therapeutic exercise;DME and/or AE instruction;Therapeutic activities;Patient/family education    OT Goals(Current goals can be found in the care plan section) Acute Rehab OT Goals Patient Stated Goal: Pt would like to return home and be as independent as possible. OT Goal Formulation: With patient Time For Goal Achievement: 12/10/21 Potential to Achieve Goals: Good ADL Goals Pt Will Perform Lower Body Bathing: with modified independence Pt Will Perform Lower Body Dressing: with modified independence Pt Will Transfer to Toilet: with modified independence  OT Frequency: Min 2X/week    Co-evaluation              AM-PAC OT "6 Clicks" Daily Activity     Outcome Measure Help from another person eating meals?: None Help from another person taking care of personal grooming?: None Help from another person toileting, which includes using toliet, bedpan, or urinal?: A Little Help from another person bathing (including washing,  rinsing, drying)?: A Lot Help from another person to put on and taking off regular upper body clothing?: None Help from another person to put on and taking off regular lower body clothing?: A Little 6 Click Score: 20   End of Session Equipment Utilized During Treatment: Gait belt (cane)  Activity Tolerance: Patient tolerated treatment well Patient left: in chair;with call bell/phone within reach  OT Visit Diagnosis: Pain;Muscle weakness (generalized) (M62.81) Pain - Right/Left: Left (bilateral leg edema pain) Pain - part of body: Leg                 Time: 5051-8335 OT Time Calculation (min): 30 min Charges:  OT General Charges $OT Visit: 1 Visit OT Evaluation $OT Eval Low Complexity: 1 Low OT Treatments $Self Care/Home Management : 8-22 mins  Mordechai Matuszak T Derian Dimalanta, OTR/L, CLT  11/26/2021, 12:09 PM

## 2021-11-27 DIAGNOSIS — I5033 Acute on chronic diastolic (congestive) heart failure: Secondary | ICD-10-CM | POA: Diagnosis not present

## 2021-11-27 LAB — BASIC METABOLIC PANEL
Anion gap: 7 (ref 5–15)
BUN: 34 mg/dL — ABNORMAL HIGH (ref 8–23)
CO2: 24 mmol/L (ref 22–32)
Calcium: 8.2 mg/dL — ABNORMAL LOW (ref 8.9–10.3)
Chloride: 101 mmol/L (ref 98–111)
Creatinine, Ser: 1.77 mg/dL — ABNORMAL HIGH (ref 0.44–1.00)
GFR, Estimated: 28 mL/min — ABNORMAL LOW (ref >60)
Glucose, Bld: 81 mg/dL (ref 70–99)
Potassium: 4 mmol/L (ref 3.5–5.1)
Sodium: 132 mmol/L — ABNORMAL LOW (ref 135–145)

## 2021-11-27 MED ORDER — METOLAZONE 2.5 MG PO TABS
2.5000 mg | ORAL_TABLET | Freq: Every day | ORAL | Status: DC
  Administered 2021-11-27: 2.5 mg via ORAL
  Filled 2021-11-27 (×2): qty 1

## 2021-11-27 NOTE — Progress Notes (Signed)
PROGRESS NOTE    AI SONNENFELD  QQI:[id number] DOB: 11/20/1940 DOA: 11/23/2021 PCP: Einar Pheasant, MD    Brief Narrative:    81 y.o. female with with history of chronic diastolic CHF last EF measured in April 2023 was 43 to 65% with grade 2 diastolic dysfunction, chronic and disease stage IV, anemia, sleep apnea, CAD status post tenting, history of paroxysmal atrial fibrillation not on anticoagulation secondary to recurrent GI bleed was referred to the ER by patient's cardiologist after patient was found to have increasing peripheral edema.  Patient states over the last 1 week patient has gained at least 4 pounds and has been having increasing peripheral edema.  Patient had gone to her nephrologist 2 days ago and her torsemide was increased from 20 twice daily and it is now 40 twice daily which patient has been taking for last 2 days.   Assessment & Plan:   Principal Problem:   Acute on chronic diastolic CHF (congestive heart failure) (HCC) Active Problems:   CAD S/P PCI pRCA Promus DES 3.5 x 16 (4.1 mm), ostRPDA Promus DES 2.5 x 12 (2.7 mm)   Paroxysmal atrial fibrillation (HCC)   Essential hypertension, benign   OSA on CPAP   Acute CHF (congestive heart failure) (HCC)   Acute decompensated heart failure (HCC)  Acute on chronic diastolic congestive heart failure EF in April 2023 9417%, grade 2 diastolic dysfunction 4 pound weight gain in the last 1 week Torsemide dosing recently increased Was sent in by outpatient cardiology UOP not accurately recorded Plan: Ensure accurate monitoring of UOP Continue Lasix 60 mg IV twice daily Add metolazone 2.5 mg p.o. daily Target net negative 1.5-2 L daily Daily BMP Monitor and replace electrolytes aggressively No ACE/ARB due to renal failure Close monitoring of electrolytes and renal function  Obstructive sleep apnea Home CPAP nightly  Coronary disease status post PCI No chest pain, no concern for ACS at this time Does not  appear to be on any antiplatelet agents Defer to outpatient cardiology SL nitro as needed chest pain  Paroxysmal atrial fibrillation Currently in sinus rhythm No anticoagulation secondary to history of GI bleed Continue amiodarone Outpatient cardiology follow-up  Chronic kidney disease stage IV Creatinine at or near baseline Daily BMP  DVT prophylaxis: SQ Lovenox Code Status: Full Family Communication: None today.  Offered to call but patient declined Disposition Plan: Status is: Inpatient Remains inpatient appropriate because: Acute decompensated heart failure on IV diuretic  Level of care: Telemetry Cardiac  Consultants:  None  Procedures:  None  Antimicrobials: None   Subjective: Seen and examined.  No dyspnea.  No complaints of pain.  Objective: Vitals:   11/26/21 2329 11/27/21 0426 11/27/21 0740 11/27/21 1050  BP: (!) 126/48 104/61 (!) 113/36 (!) 121/49  Pulse: 66 70 66 69  Resp: '20  16 18  '$ Temp: 98.2 F (36.8 C)  98.8 F (37.1 C) 97.8 F (36.6 C)  TempSrc:   Oral   SpO2: 97% 98% 96% 98%  Weight:      Height:        Intake/Output Summary (Last 24 hours) at 11/27/2021 1155 Last data filed at 11/27/2021 0912 Gross per 24 hour  Intake 1100 ml  Output 1200 ml  Net -100 ml   Filed Weights   11/23/21 1512 11/25/21 0500 11/26/21 0500  Weight: 80.3 kg 80.7 kg 81.6 kg    Examination:  General exam: NAD Respiratory system: Mild bibasilar crackles.  Normal work of breathing.  Room air Cardiovascular  system: S1-S2, RRR, no murmurs, 2+ pitting edema BLE Gastrointestinal system: Soft, NT/ND, normal bowel sounds Central nervous system: Alert and oriented. No focal neurological deficits. Extremities: Symmetric 5 x 5 power. Skin: No rashes, lesions or ulcers Psychiatry: Judgement and insight appear normal. Mood & affect appropriate.     Data Reviewed: I have personally reviewed following labs and imaging studies  CBC: Recent Labs  Lab 11/23/21 1514  11/23/21 1931 11/24/21 0505  WBC 11.1* 11.7* 9.7  HGB 9.4* 9.2* 8.2*  HCT 28.5* 28.4* 25.4*  MCV 91.1 92.2 91.7  PLT 195 194 812   Basic Metabolic Panel: Recent Labs  Lab 11/23/21 1514 11/23/21 1931 11/24/21 0505 11/25/21 0439 11/26/21 0749 11/27/21 0559  NA 138  --  139 137 136 132*  K 5.2*  --  3.8 3.8 4.0 4.0  CL 105  --  106 106 104 101  CO2 24  --  '25 24 29 24  '$ GLUCOSE 123*  --  100* 80 86 81  BUN 31*  --  32* 32* 34* 34*  CREATININE 2.14* 2.12* 2.01* 2.04* 1.93* 1.77*  CALCIUM 9.0  --  8.5* 8.2* 8.2* 8.2*  MG  --   --  2.2  --   --   --    GFR: Estimated Creatinine Clearance: 22.8 mL/min (A) (by C-G formula based on SCr of 1.77 mg/dL (H)). Liver Function Tests: No results for input(s): "AST", "ALT", "ALKPHOS", "BILITOT", "PROT", "ALBUMIN" in the last 168 hours.  No results for input(s): "LIPASE", "AMYLASE" in the last 168 hours. No results for input(s): "AMMONIA" in the last 168 hours. Coagulation Profile: No results for input(s): "INR", "PROTIME" in the last 168 hours. Cardiac Enzymes: No results for input(s): "CKTOTAL", "CKMB", "CKMBINDEX", "TROPONINI" in the last 168 hours. BNP (last 3 results) No results for input(s): "PROBNP" in the last 8760 hours. HbA1C: No results for input(s): "HGBA1C" in the last 72 hours. CBG: No results for input(s): "GLUCAP" in the last 168 hours. Lipid Profile: No results for input(s): "CHOL", "HDL", "LDLCALC", "TRIG", "CHOLHDL", "LDLDIRECT" in the last 72 hours. Thyroid Function Tests: No results for input(s): "TSH", "T4TOTAL", "FREET4", "T3FREE", "THYROIDAB" in the last 72 hours.  Anemia Panel: No results for input(s): "VITAMINB12", "FOLATE", "FERRITIN", "TIBC", "IRON", "RETICCTPCT" in the last 72 hours. Sepsis Labs: No results for input(s): "PROCALCITON", "LATICACIDVEN" in the last 168 hours.  No results found for this or any previous visit (from the past 240 hour(s)).       Radiology Studies: No results  found.      Scheduled Meds:  amiodarone  200 mg Oral Daily   docusate sodium  100 mg Oral BID   enoxaparin (LOVENOX) injection  30 mg Subcutaneous Q24H   furosemide  60 mg Intravenous Q12H   Continuous Infusions:   LOS: 3 days    Sidney Ace, MD [facility]   If 7PM-7AM, please contact night-coverage  11/27/2021, 11:55 AM

## 2021-11-28 ENCOUNTER — Ambulatory Visit: Payer: Medicare Other | Admitting: Family

## 2021-11-28 DIAGNOSIS — I5033 Acute on chronic diastolic (congestive) heart failure: Secondary | ICD-10-CM | POA: Diagnosis not present

## 2021-11-28 LAB — CBC WITH DIFFERENTIAL/PLATELET
Abs Immature Granulocytes: 0.06 10*3/uL (ref 0.00–0.07)
Basophils Absolute: 0.1 10*3/uL (ref 0.0–0.1)
Basophils Relative: 1 %
Eosinophils Absolute: 0.1 10*3/uL (ref 0.0–0.5)
Eosinophils Relative: 1 %
HCT: 29.7 % — ABNORMAL LOW (ref 36.0–46.0)
Hemoglobin: 9.5 g/dL — ABNORMAL LOW (ref 12.0–15.0)
Immature Granulocytes: 1 %
Lymphocytes Relative: 15 %
Lymphs Abs: 1.4 10*3/uL (ref 0.7–4.0)
MCH: 30 pg (ref 26.0–34.0)
MCHC: 32 g/dL (ref 30.0–36.0)
MCV: 93.7 fL (ref 80.0–100.0)
Monocytes Absolute: 1 10*3/uL (ref 0.1–1.0)
Monocytes Relative: 11 %
Neutro Abs: 6.3 10*3/uL (ref 1.7–7.7)
Neutrophils Relative %: 71 %
Platelets: 184 10*3/uL (ref 150–400)
RBC: 3.17 MIL/uL — ABNORMAL LOW (ref 3.87–5.11)
RDW: 21.5 % — ABNORMAL HIGH (ref 11.5–15.5)
Smear Review: NORMAL
WBC: 8.8 10*3/uL (ref 4.0–10.5)
nRBC: 0 % (ref 0.0–0.2)

## 2021-11-28 LAB — BASIC METABOLIC PANEL
Anion gap: 9 (ref 5–15)
BUN: 37 mg/dL — ABNORMAL HIGH (ref 8–23)
CO2: 25 mmol/L (ref 22–32)
Calcium: 8.2 mg/dL — ABNORMAL LOW (ref 8.9–10.3)
Chloride: 100 mmol/L (ref 98–111)
Creatinine, Ser: 1.85 mg/dL — ABNORMAL HIGH (ref 0.44–1.00)
GFR, Estimated: 27 mL/min — ABNORMAL LOW (ref >60)
Glucose, Bld: 83 mg/dL (ref 70–99)
Potassium: 3 mmol/L — ABNORMAL LOW (ref 3.5–5.1)
Sodium: 134 mmol/L — ABNORMAL LOW (ref 135–145)

## 2021-11-28 LAB — MAGNESIUM: Magnesium: 2.2 mg/dL (ref 1.7–2.4)

## 2021-11-28 MED ORDER — POTASSIUM CHLORIDE CRYS ER 20 MEQ PO TBCR
40.0000 meq | EXTENDED_RELEASE_TABLET | ORAL | Status: AC
  Administered 2021-11-28 (×2): 40 meq via ORAL
  Filled 2021-11-28 (×2): qty 2

## 2021-11-28 MED ORDER — DEXTROSE 5 % IV SOLN
6.0000 mg/h | INTRAVENOUS | Status: DC
  Administered 2021-11-28 – 2021-12-03 (×5): 6 mg/h via INTRAVENOUS
  Filled 2021-11-28 (×6): qty 20

## 2021-11-28 NOTE — Progress Notes (Signed)
[facility]  ROUNDING NOTE   Subjective:   April [name] is a 81 year old female with past medical history including diastolic CHF, anemia, CAD, paroxysmal atrial fibrillation without anticoagulation.  GI bleed.  Chronic kidney disease stage IV.  Patient reports to ED with complaints of lower extremity edema.  Patient has been admitted for Acute CHF (congestive heart failure) ([place]) [I50.9] Acute decompensated heart failure ([place]) [I50.9]  Patient is known to our practice and is followed outpatient by Dr. Candiss Norse.  Patient recently had admission for same complaints, increased lower extremity edema, that also followed hypokalemia, potassium 2.0.  Patient is seen sitting in chair.  No family at bedside.  Patient states he is doing over the past 5 to 7 days.  She was recently seen in nephrology's office and received an increased dose of torsemide.  She reports taking the medication for 2 to 3 days.  Denies nausea, vomiting, or diarrhea.  States she is maintaining fluid restriction.  Denies chest pain or discomfort.  Labs on ED arrival include potassium 4.7, creatinine 1.4 with GFR 22.  Elevated WBCs of 11.1 with hemoglobin 9.4.  Chest x-ray negative.  We have been consulted to manage kidney function and assist with fluid removal.   Objective:  Vital signs in last 24 hours:  Temp:  [97.3 F (36.3 C)-98.3 F (36.8 C)] 97.3 F (36.3 C) (07/31 1157) Pulse Rate:  [61-72] 72 (07/31 1157) Resp:  [14-18] 14 (07/31 1157) BP: (109-127)/(41-51) 126/51 (07/31 1157) SpO2:  [96 %-100 %] 97 % (07/31 1157) Weight:  [81.1 kg] 81.1 kg (07/31 0500)  Weight change:  Filed Weights   11/25/21 0500 11/26/21 0500 11/28/21 0500  Weight: 80.7 kg 81.6 kg 81.1 kg    Intake/Output: I/O last 3 completed shifts: In: 460 [P.O.:460] Out: 1450 [Urine:1450]   Intake/Output this shift:  Total I/O In: 240 [P.O.:240] Out: 300 [Urine:300]  Physical Exam: General: NAD, sitting in chair  Head:  Normocephalic, atraumatic. Moist oral mucosal membranes  Eyes: Anicteric  Lungs:  Clear to auscultation, normal effort, room air  Heart: Regular rate and rhythm  Abdomen:  Soft, nontender, nondistended  Extremities: 2+ peripheral edema.  Neurologic: Nonfocal, moving all four extremities  Skin: No lesions  Access: None    Basic Metabolic Panel: Recent Labs  Lab 11/24/21 0505 11/25/21 0439 11/26/21 0749 11/27/21 0559 11/28/21 0548 11/28/21 0842  NA 139 137 136 132* 134*  --   K 3.8 3.8 4.0 4.0 3.0*  --   CL 106 106 104 101 100  --   CO2 '25 24 29 24 25  '$ --   GLUCOSE 100* 80 86 81 83  --   BUN 32* 32* 34* 34* 37*  --   CREATININE 2.01* 2.04* 1.93* 1.77* 1.85*  --   CALCIUM 8.5* 8.2* 8.2* 8.2* 8.2*  --   MG 2.2  --   --   --   --  2.2    Liver Function Tests: No results for input(s): "AST", "ALT", "ALKPHOS", "BILITOT", "PROT", "ALBUMIN" in the last 168 hours. No results for input(s): "LIPASE", "AMYLASE" in the last 168 hours. No results for input(s): "AMMONIA" in the last 168 hours.  CBC: Recent Labs  Lab 11/23/21 1514 11/23/21 1931 11/24/21 0505 11/28/21 1141  WBC 11.1* 11.7* 9.7 8.8  NEUTROABS  --   --   --  6.3  HGB 9.4* 9.2* 8.2* 9.5*  HCT 28.5* 28.4* 25.4* 29.7*  MCV 91.1 92.2 91.7 93.7  PLT 195 194 169  184    Cardiac Enzymes: No results for input(s): "CKTOTAL", "CKMB", "CKMBINDEX", "TROPONINI" in the last 168 hours.  BNP: Invalid input(s): "POCBNP"  CBG: No results for input(s): "GLUCAP" in the last 168 hours.  Microbiology: Results for orders placed or performed during the hospital encounter of 07/07/21  Resp Panel by RT-PCR (Flu A&B, Covid) Nasopharyngeal Swab     Status: None   Collection Time: 07/07/21  8:01 PM   Specimen: Nasopharyngeal Swab; Nasopharyngeal(NP) swabs in vial transport medium  Result Value Ref Range Status   SARS Coronavirus 2 by RT PCR NEGATIVE NEGATIVE Final    Comment: (NOTE) SARS-CoV-2 target nucleic acids are NOT  DETECTED.  The SARS-CoV-2 RNA is generally detectable in upper respiratory specimens during the acute phase of infection. The lowest concentration of SARS-CoV-2 viral copies this assay can detect is 138 copies/mL. A negative result does not preclude SARS-Cov-2 infection and should not be used as the sole basis for treatment or other patient management decisions. A negative result may occur with  improper specimen collection/handling, submission of specimen other than nasopharyngeal swab, presence of viral mutation(s) within the areas targeted by this assay, and inadequate number of viral copies(<138 copies/mL). A negative result must be combined with clinical observations, patient history, and epidemiological information. The expected result is Negative.  Fact Sheet for Patients:  EntrepreneurPulse.com.au  Fact Sheet for Healthcare Providers:  IncredibleEmployment.be  This test is no t yet approved or cleared by the [place] FDA and  has been authorized for detection and/or diagnosis of SARS-CoV-2 by FDA under an Emergency Use Authorization (EUA). This EUA will remain  in effect (meaning this test can be used) for the duration of the COVID-19 declaration under Section 564(b)(1) of the Act, 21 U.S.C.section 360bbb-3(b)(1), unless the authorization is terminated  or revoked sooner.       Influenza A by PCR NEGATIVE NEGATIVE Final   Influenza B by PCR NEGATIVE NEGATIVE Final    Comment: (NOTE) The Xpert Xpress SARS-CoV-2/FLU/RSV plus assay is intended as an aid in the diagnosis of influenza from Nasopharyngeal swab specimens and should not be used as a sole basis for treatment. Nasal washings and aspirates are unacceptable for Xpert Xpress SARS-CoV-2/FLU/RSV testing.  Fact Sheet for Patients: EntrepreneurPulse.com.au  Fact Sheet for Healthcare Providers: IncredibleEmployment.be  This test is not yet  approved or cleared by the [place] FDA and has been authorized for detection and/or diagnosis of SARS-CoV-2 by FDA under an Emergency Use Authorization (EUA). This EUA will remain in effect (meaning this test can be used) for the duration of the COVID-19 declaration under Section 564(b)(1) of the Act, 21 U.S.C. section 360bbb-3(b)(1), unless the authorization is terminated or revoked.  Performed at [facility], [address] [place], [place] [address]     Coagulation Studies: No results for input(s): "LABPROT", "INR" in the last 72 hours.  Urinalysis: No results for input(s): "COLORURINE", "LABSPEC", "PHURINE", "GLUCOSEU", "HGBUR", "BILIRUBINUR", "KETONESUR", "PROTEINUR", "UROBILINOGEN", "NITRITE", "LEUKOCYTESUR" in the last 72 hours.  Invalid input(s): "APPERANCEUR"    Imaging: No results found.   Medications:    furosemide (LASIX) 200 mg in dextrose 5 % 100 mL (2 mg/mL) infusion 6 mg/hr (11/28/21 1007)    amiodarone  200 mg Oral Daily   docusate sodium  100 mg Oral BID   enoxaparin (LOVENOX) injection  30 mg Subcutaneous Q24H   nitroGLYCERIN  Assessment/ Plan:  Ms. DESSIREE SZE is a 81 y.o.  female with past medical history including diastolic CHF,  anemia, CAD, paroxysmal atrial fibrillation without anticoagulation.  GI bleed.  Chronic kidney disease stage IV.  Patient reports to ED with complaints of lower extremity edema.  Patient has been admitted for Acute CHF (congestive heart failure) ([place]) [I50.9] Acute decompensated heart failure ([place]) [I50.9]   Acute Kidney Injury on chronic kidney disease stage IV with baseline creatinine 1.75 and GFR of 29 on 08/24/21.  Acute kidney injury secondary to volume overload and diuresis.  No IV contrast exposure.  No acute need for dialysis.  Creatinine as high as 2.12 during this admission however improving daily. Received IV Furosemide during this admission. Now receiving Furosemide drip '6mg'$ /hr. Will monitor  output and renal function.    Lab Results  Component Value Date   CREATININE 1.85 (H) 11/28/2021   CREATININE 1.77 (H) 11/27/2021   CREATININE 1.93 (H) 11/26/2021    Intake/Output Summary (Last 24 hours) at 11/28/2021 1419 Last data filed at 11/28/2021 1249 Gross per 24 hour  Intake 460 ml  Output 1150 ml  Net -690 ml   2. Chronic diastolic heart failure. ECHO from 08/10/21 shows EF 60-65% with a Grade II diastolic dysfunction with a mildly mitral valve regurgitation. Will manage fluid with Furosemide drip.   3. Anemia of chronic kidney disease Lab Results  Component Value Date   HGB 9.5 (L) 11/28/2021    Hgb acceptable. Will continue to monitor.    LOS: [place] 7/31/20232:19 PM

## 2021-11-28 NOTE — TOC Initial Note (Signed)
Transition of Care [facility]) - Initial/Assessment Note    Patient Details  Name: April [name] MRN: [id number] Date of Birth: 09/23/1940  Transition of Care [facility]) CM/SW Contact:    April Chroman, LCSW Phone Number: 11/28/2021, 11:18 AM  Clinical Narrative:  Readmission prevention screen complete. CSW met with patient. No supports at bedside. CSW introduced role and explained that discharge planning would be discussed. She lives at [place]. PCP is April Pheasant, MD at [facility] on [address] She was driving herself to appointments until about 3 months ago. Now she typically gets rides from various friends, her son, or [place]'s transportation [place]. Pharmacy is [facility]. No issues obtaining medications. She confirmed she is active with [facility]. She is currently receiving PT and RN. [facility] representative said they can add OT. Patient has a RW, rollator, SPC, and shower chair at home. PT recommending a 3-in-1 which she declined. No further concerns. CSW encouraged patient to contact CSW as needed. CSW will continue to follow patient for support and facilitate return home once stable. She said her son or daughter-in-law, April [name], will likely transport her home at discharge.                Expected Discharge Plan: [place] Barriers to Discharge: Continued Medical Work up   Patient Goals and CMS Choice     Choice offered to / list presented to : Patient  Expected Discharge Plan and Services Expected Discharge Plan: [place] Acute Care Choice: Resumption of Svcs/PTA Provider Living arrangements for the past 2 months: [place] Arranged: RN, PT, OT [facility] Agency: [place] ([place]) Date HH Agency Contacted: 11/28/21   Representative spoke with at [place]: April [name]  Prior Living Arrangements/Services Living  arrangements for the past 2 months: [place] Lives with:: Self Patient language and need for interpreter reviewed:: Yes Do you feel safe going back to the place where you live?: Yes      Need for Family Participation in Patient Care: Yes (Comment)   Current home services: DME, Home PT, Home RN Criminal Activity/Legal Involvement Pertinent to Current Situation/Hospitalization: No - Comment as needed  Activities of Daily Living Home Assistive Devices/Equipment: Cane (specify quad or straight) ADL Screening (condition at time of admission) Patient's cognitive ability adequate to safely complete daily activities?: Yes Is the patient deaf or have difficulty hearing?: No Does the patient have difficulty seeing, even when wearing glasses/contacts?: No Does the patient have difficulty concentrating, remembering, or making decisions?: No Patient able to express need for assistance with ADLs?: Yes Does the patient have difficulty dressing or bathing?: No Independently performs ADLs?: Yes (appropriate for developmental age) Does the patient have difficulty walking or climbing stairs?: No Weakness of Legs: None Weakness of Arms/Hands: None  Permission Sought/Granted Permission sought to share information with : Facility Art therapist granted to share information with : Yes, Verbal Permission Granted     Permission granted to share info w AGENCY: [facility]        Emotional Assessment Appearance:: Appears stated age Attitude/Demeanor/Rapport: Engaged, Gracious Affect (typically observed): Accepting, Appropriate, Calm, Pleasant Orientation: : Oriented to Self, Oriented to Place, Oriented to  Time, Oriented to Situation Alcohol / Substance Use:  Not Applicable Psych Involvement: No (comment)  Admission diagnosis:  Acute CHF (congestive heart failure) (HCC) [I50.9] Acute decompensated heart failure (HCC) [I50.9] Patient Active Problem List    Diagnosis Date Noted   Acute decompensated heart failure ([place]) 11/24/2021   Acute CHF (congestive heart failure) ([place]) 11/23/2021   Weakness 10/29/2021   Acute respiratory failure with hypoxia ([place]) 10/18/2021   History of recurrent GI bleed on apixaban 10/17/2021   History of adverse reaction to anticoagulant medication 10/17/2021   Electrolyte abnormality 10/17/2021   Acute on chronic congestive heart failure (HCC)    Hypokalemia 08/11/2021   Acute on chronic diastolic CHF (congestive heart failure) ([place]) 08/08/2021   Chronic obstructive pulmonary disease, unspecified COPD type ([place]) 08/01/2021   Iron deficiency anemia    Gastric erythema    Symptomatic anemia    GI bleeding 07/07/2021   Thrombophilia ([place]) 04/10/2021   Aortic atherosclerosis ([place]) 01/03/2021   Cellulitis of leg, left 10/26/2020   Breast tenderness 07/14/2020   Abnormal liver function tests 03/11/2020   Breast pain 12/15/2018   Lymphedema 07/09/2018   Chronic heart failure with preserved ejection fraction ([place]) 06/03/2018   Acute renal failure superimposed on stage 3b chronic kidney disease ([place]) 06/03/2018   Lower extremity edema 01/19/2018   Arthropathy of lumbar facet joint 08/08/2017   Degeneration of lumbar intervertebral disc 08/08/2017   Osteoarthritis of knee 08/08/2017   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   Paroxysmal atrial fibrillation ([place]) 03/27/2016   CAD S/P PCI pRCA Promus DES 3.5 x 16 (4.1 mm), ostRPDA Promus DES 2.5 x 12 (2.7 mm) 03/25/2016   Right knee pain 11/13/2015   Coronary artery disease involving native coronary artery of native heart with unstable angina pectoris ([place]) 06/01/2015   Diabetes mellitus ([place]) 05/16/2015   Gastritis 04/01/2015   Chest pain 04/01/2015   Hiatal hernia 08/25/2014   DOE (dyspnea on exertion) 07/15/2014   Congestion of throat 07/06/2014   Health care maintenance 07/06/2014   Fibrocystic breast disease 07/06/2014   BMI 38.0-38.9,adult 04/19/2014   Stress 02/16/2013    Rib pain on right side 02/16/2013   Acute on Chronic dyspnea 10/13/2012   GERD (gastroesophageal reflux disease) 10/13/2012   Essential hypertension, benign 08/18/2012   Hypercholesterolemia 08/18/2012   History of colonic polyps 08/18/2012   OSA on CPAP 08/18/2012   Osteoporosis 08/18/2012   PCP:  April Pheasant, MD Pharmacy:   [facility], [place] [address] [place] [address] Phone: [phone] Fax: [place], [place] [address] Phone: [phone] Fax: [place], [place] [address] [place] [address] Phone: [phone] Fax: [phone]     Social Determinants of Health (SDOH) Interventions    Readmission Risk Interventions    11/28/2021   11:17 AM 10/19/2021   12:27 PM  Readmission Risk Prevention Plan  Transportation Screening Complete Complete  PCP or Specialist Appt within 3-5 Days Complete Complete  HRI or [place] Complete Complete  Social Work Consult for [place] Planning/Counseling Complete Complete  Palliative Care Screening Not Applicable Complete  Medication Review Press photographer) Complete Complete

## 2021-11-28 NOTE — Care Management Important Message (Signed)
Important Message  Patient Details  Name: April [name] MRN: [id number] Date of Birth: 29-Nov-1940   Medicare Important Message Given:  Yes     Dannette Barbara 11/28/2021, 11:44 AM

## 2021-11-28 NOTE — Progress Notes (Signed)
Patient just mentioned that she just had a really dark stool that appeared bloody in nature to her. RN not present to visualize.  MD notified.

## 2021-11-28 NOTE — Progress Notes (Signed)
PT Cancellation Note  Patient Details Name: CARLICIA LEAVENS MRN: [id number] DOB: Jan 26, 1941   Cancelled Treatment:    Reason Eval/Treat Not Completed: Other (comment).  PT attempted to see pt this morning but pt was in bathroom upon PT arrival and pt's son (and pt's nurse) reports pt had just ambulated around nursing station with RW so therapist planned to return later in day.  Upon PT arrival to nursing unit this afternoon, pt already ambulating in hallway with mobility specialist (not available for PT session).  Will re-attempt PT session at a later date/time.  Leitha Bleak, PT 11/28/21, 2:58 PM

## 2021-11-28 NOTE — Progress Notes (Signed)
PROGRESS NOTE    April [name]  VFI:[id number] DOB: [birth year] DOA: 11/23/2021 PCP: Einar Pheasant, MD    Brief Narrative:   81 y.o. female with with history of chronic diastolic CHF last EF measured in April 2023 was 66 to 65% with grade 2 diastolic dysfunction, chronic and disease stage IV, anemia, sleep apnea, CAD status post tenting, history of paroxysmal atrial fibrillation not on anticoagulation secondary to recurrent GI bleed was referred to the ER by patient's cardiologist after patient was found to have increasing peripheral edema.  Patient states over the last 1 week patient has gained at least 4 pounds and has been having increasing peripheral edema.  Patient had gone to her nephrologist 2 days ago and her torsemide was increased from 20 twice daily and it is now 40 twice daily which patient has been taking for last 2 days.   Assessment & Plan:   Principal Problem:   Acute on chronic diastolic CHF (congestive heart failure) (HCC) Active Problems:   CAD S/P PCI pRCA Promus DES 3.5 x 16 (4.1 mm), ostRPDA Promus DES 2.5 x 12 (2.7 mm)   Paroxysmal atrial fibrillation (HCC)   Essential hypertension, benign   OSA on CPAP   Acute CHF (congestive heart failure) (HCC)   Acute decompensated heart failure (HCC)  Acute on chronic diastolic congestive heart failure Hypokalemia EF in April 2023 41-66%, grade 2 diastolic dysfunction 4 pound weight gain in the last 1 week Torsemide dosing recently increased Was sent in by outpatient cardiology UOP not accurately recorded Difficult to mobilize fluid Plan: Ensure accurate monitoring of UOP Stop twice daily Lasix Stop metolazone Initiate Lasix GTT at 6 mg/h Nephrology consult, case discussed with Dr. Candiss Norse Daily BMP  Close monitoring of electrolytes  Close monitoring of renal function    Obstructive sleep apnea Home CPAP nightly  Coronary disease status post PCI No chest pain, no concern for ACS at this time Does not appear  to be on any antiplatelet agents Defer to outpatient cardiology SL nitro as needed chest pain  Paroxysmal atrial fibrillation Currently in sinus rhythm No anticoagulation secondary to history of GI bleed Continue amiodarone Outpatient cardiology follow-up  Chronic kidney disease stage IV Creatinine at or near baseline Daily BMP  DVT prophylaxis: SQ Lovenox Code Status: Full Family Communication: None today.  Offered to call but patient declined Disposition Plan: Status is: Inpatient Remains inpatient appropriate because: Acute decompensated heart failure on IV diuretic  Level of care: Telemetry Cardiac  Consultants:  None  Procedures:  None  Antimicrobials: None   Subjective: Seen and examined.  Comfortably in bed.  No visible distress.  No complaints of pain.  Objective: Vitals:   11/28/21 0001 11/28/21 0337 11/28/21 0500 11/28/21 0823  BP: (!) 127/48 (!) 114/41  (!) 115/44  Pulse: 70 64  65  Resp: '16 16  16  '$ Temp: 98.1 F (36.7 C) 98 F (36.7 C)  98 F (36.7 C)  TempSrc:      SpO2: 98% 97%  96%  Weight:   81.1 kg   Height:        Intake/Output Summary (Last 24 hours) at 11/28/2021 1150 Last data filed at 11/28/2021 0900 Gross per 24 hour  Intake 460 ml  Output 850 ml  Net -390 ml   Filed Weights   11/25/21 0500 11/26/21 0500 11/28/21 0500  Weight: 80.7 kg 81.6 kg 81.1 kg    Examination:  General exam: No acute distress Respiratory system: Bibasilar crackles.  Normal work  of breathing.  Room air Cardiovascular system: S1-S2, RRR, no murmurs, 2+ pitting edema BLE Gastrointestinal system: Soft, NT/ND, normal bowel sounds Central nervous system: Alert and oriented. No focal neurological deficits. Extremities: Symmetric 5 x 5 power. Skin: No rashes, lesions or ulcers Psychiatry: Judgement and insight appear normal. Mood & affect appropriate.     Data Reviewed: I have personally reviewed following labs and imaging studies  CBC: Recent Labs   Lab 11/23/21 1514 11/23/21 1931 11/24/21 0505  WBC 11.1* 11.7* 9.7  HGB 9.4* 9.2* 8.2*  HCT 28.5* 28.4* 25.4*  MCV 91.1 92.2 91.7  PLT 195 194 937   Basic Metabolic Panel: Recent Labs  Lab 11/24/21 0505 11/25/21 0439 11/26/21 0749 11/27/21 0559 11/28/21 0548 11/28/21 0842  NA 139 137 136 132* 134*  --   K 3.8 3.8 4.0 4.0 3.0*  --   CL 106 106 104 101 100  --   CO2 '25 24 29 24 25  '$ --   GLUCOSE 100* 80 86 81 83  --   BUN 32* 32* 34* 34* 37*  --   CREATININE 2.01* 2.04* 1.93* 1.77* 1.85*  --   CALCIUM 8.5* 8.2* 8.2* 8.2* 8.2*  --   MG 2.2  --   --   --   --  2.2   GFR: Estimated Creatinine Clearance: 21.7 mL/min (A) (by C-G formula based on SCr of 1.85 mg/dL (H)). Liver Function Tests: No results for input(s): "AST", "ALT", "ALKPHOS", "BILITOT", "PROT", "ALBUMIN" in the last 168 hours.  No results for input(s): "LIPASE", "AMYLASE" in the last 168 hours. No results for input(s): "AMMONIA" in the last 168 hours. Coagulation Profile: No results for input(s): "INR", "PROTIME" in the last 168 hours. Cardiac Enzymes: No results for input(s): "CKTOTAL", "CKMB", "CKMBINDEX", "TROPONINI" in the last 168 hours. BNP (last 3 results) No results for input(s): "PROBNP" in the last 8760 hours. HbA1C: No results for input(s): "HGBA1C" in the last 72 hours. CBG: No results for input(s): "GLUCAP" in the last 168 hours. Lipid Profile: No results for input(s): "CHOL", "HDL", "LDLCALC", "TRIG", "CHOLHDL", "LDLDIRECT" in the last 72 hours. Thyroid Function Tests: No results for input(s): "TSH", "T4TOTAL", "FREET4", "T3FREE", "THYROIDAB" in the last 72 hours.  Anemia Panel: No results for input(s): "VITAMINB12", "FOLATE", "FERRITIN", "TIBC", "IRON", "RETICCTPCT" in the last 72 hours. Sepsis Labs: No results for input(s): "PROCALCITON", "LATICACIDVEN" in the last 168 hours.  No results found for this or any previous visit (from the past 240 hour(s)).       Radiology Studies: No  results found.      Scheduled Meds:  amiodarone  200 mg Oral Daily   docusate sodium  100 mg Oral BID   enoxaparin (LOVENOX) injection  30 mg Subcutaneous Q24H   Continuous Infusions:  furosemide (LASIX) 200 mg in dextrose 5 % 100 mL (2 mg/mL) infusion 6 mg/hr (11/28/21 1007)     LOS: 4 days    Sidney Ace, MD [facility]   If 7PM-7AM, please contact night-coverage  11/28/2021, 11:50 AM

## 2021-11-28 NOTE — Progress Notes (Signed)
Mobility Specialist - Progress Note    11/28/21 1503  Mobility  Activity Ambulated independently in hallway;Stood at bedside;Dangled on edge of bed  Level of Assistance Independent  Assistive Device Other (Comment)  Distance Ambulated (ft) 160 ft  Activity Response Tolerated well  $Mobility charge 1 Mobility    Pt standing in doorway on RA upon arrival. PT ambulates 1 lap around NS using IV pole for stability. PT returns to recliner with needs in reach.   Gretchen Short  Mobility Specialist  11/28/21 3:06 PM

## 2021-11-29 ENCOUNTER — Inpatient Hospital Stay: Payer: Medicare Other | Admitting: Internal Medicine

## 2021-11-29 ENCOUNTER — Inpatient Hospital Stay: Payer: Medicare Other

## 2021-11-29 DIAGNOSIS — N184 Chronic kidney disease, stage 4 (severe): Secondary | ICD-10-CM | POA: Insufficient documentation

## 2021-11-29 DIAGNOSIS — I5033 Acute on chronic diastolic (congestive) heart failure: Secondary | ICD-10-CM | POA: Diagnosis not present

## 2021-11-29 LAB — CBC
HCT: 26.7 % — ABNORMAL LOW (ref 36.0–46.0)
Hemoglobin: 9 g/dL — ABNORMAL LOW (ref 12.0–15.0)
MCH: 30.4 pg (ref 26.0–34.0)
MCHC: 33.7 g/dL (ref 30.0–36.0)
MCV: 90.2 fL (ref 80.0–100.0)
Platelets: 207 10*3/uL (ref 150–400)
RBC: 2.96 MIL/uL — ABNORMAL LOW (ref 3.87–5.11)
RDW: 20.8 % — ABNORMAL HIGH (ref 11.5–15.5)
WBC: 10.7 10*3/uL — ABNORMAL HIGH (ref 4.0–10.5)
nRBC: 0 % (ref 0.0–0.2)

## 2021-11-29 LAB — COMPREHENSIVE METABOLIC PANEL
ALT: 32 U/L (ref 0–44)
AST: 76 U/L — ABNORMAL HIGH (ref 15–41)
Albumin: 2.4 g/dL — ABNORMAL LOW (ref 3.5–5.0)
Alkaline Phosphatase: 99 U/L (ref 38–126)
Anion gap: 9 (ref 5–15)
BUN: 41 mg/dL — ABNORMAL HIGH (ref 8–23)
CO2: 26 mmol/L (ref 22–32)
Calcium: 8.5 mg/dL — ABNORMAL LOW (ref 8.9–10.3)
Chloride: 97 mmol/L — ABNORMAL LOW (ref 98–111)
Creatinine, Ser: 2.13 mg/dL — ABNORMAL HIGH (ref 0.44–1.00)
GFR, Estimated: 23 mL/min — ABNORMAL LOW (ref >60)
Glucose, Bld: 136 mg/dL — ABNORMAL HIGH (ref 70–99)
Potassium: 4.5 mmol/L (ref 3.5–5.1)
Sodium: 132 mmol/L — ABNORMAL LOW (ref 135–145)
Total Bilirubin: 1.7 mg/dL — ABNORMAL HIGH (ref 0.3–1.2)
Total Protein: 6.2 g/dL — ABNORMAL LOW (ref 6.5–8.1)

## 2021-11-29 LAB — BASIC METABOLIC PANEL
Anion gap: 6 (ref 5–15)
BUN: 39 mg/dL — ABNORMAL HIGH (ref 8–23)
CO2: 25 mmol/L (ref 22–32)
Calcium: 8.2 mg/dL — ABNORMAL LOW (ref 8.9–10.3)
Chloride: 100 mmol/L (ref 98–111)
Creatinine, Ser: 2.01 mg/dL — ABNORMAL HIGH (ref 0.44–1.00)
GFR, Estimated: 24 mL/min — ABNORMAL LOW (ref >60)
Glucose, Bld: 116 mg/dL — ABNORMAL HIGH (ref 70–99)
Potassium: 3.3 mmol/L — ABNORMAL LOW (ref 3.5–5.1)
Sodium: 131 mmol/L — ABNORMAL LOW (ref 135–145)

## 2021-11-29 LAB — MAGNESIUM: Magnesium: 2.1 mg/dL (ref 1.7–2.4)

## 2021-11-29 MED ORDER — POTASSIUM CHLORIDE CRYS ER 20 MEQ PO TBCR
40.0000 meq | EXTENDED_RELEASE_TABLET | ORAL | Status: AC
  Administered 2021-11-29 (×2): 40 meq via ORAL
  Filled 2021-11-29 (×2): qty 2

## 2021-11-29 MED ORDER — SODIUM CHLORIDE 0.9 % IV SOLN
12.5000 mg | Freq: Once | INTRAVENOUS | Status: AC
  Administered 2021-11-30: 12.5 mg via INTRAVENOUS
  Filled 2021-11-29: qty 12.5

## 2021-11-29 MED ORDER — PANTOPRAZOLE SODIUM 40 MG IV SOLR
40.0000 mg | Freq: Two times a day (BID) | INTRAVENOUS | Status: DC
  Administered 2021-11-30 (×3): 40 mg via INTRAVENOUS
  Filled 2021-11-29 (×4): qty 10

## 2021-11-29 NOTE — Progress Notes (Signed)
Physical Therapy Treatment Patient Details Name: April [name] MRN: [id number] DOB: 27-May-1940 Today's Date: 11/29/2021   History of Present Illness Pt is an 81 y.o. female presenting to hospital 7/26 with increasing peripheral edema.  Pt admitted with acute on chronic diastolic CHF.  PMH includes h/o chronic diastolic CHF, anemia, sleep apnea, CAD s/p tenting, h/o paroxysmal a-fib (not on anticoagulation secondary to recurrent GI bleed), CKD, spasmodic dysphonia, STEMI, urine incontinence, B breast surgery.    PT Comments    Patient is making progress towards meeting PT goals. She ambulated a lap in the hallway with mild unsteadiness initially progressing to supervision with increased ambulation distance. Recommend to continue using rolling walker for safety with ambulation. PT will continue to follow to maximize independence and facilitate return to prior level of function.    Recommendations for follow up therapy are one component of a multi-disciplinary discharge planning process, led by the attending physician.  Recommendations may be updated based on patient status, additional functional criteria and insurance authorization.  Follow Up Recommendations  Home health PT     Assistance Recommended at Discharge Set up Supervision/Assistance  Patient can return home with the following A little help with walking and/or transfers;A little help with bathing/dressing/bathroom;Assistance with cooking/housework;Assist for transportation;Help with stairs or ramp for entrance   Equipment Recommendations  Rolling walker (2 wheels);BSC/3in1    Recommendations for Other Services       Precautions / Restrictions Precautions Precautions: Fall Restrictions Weight Bearing Restrictions: No     Mobility  Bed Mobility               General bed mobility comments: not assessed as patient sitting up on arrival and post session    Transfers Overall transfer level: Needs  assistance Equipment used: Rolling walker (2 wheels) Transfers: Sit to/from Stand Sit to Stand: Supervision           General transfer comment: multiple standing bouts performed, from recliner and from toilet. cues for safety    Ambulation/Gait Ambulation/Gait assistance: Supervision, Min guard Gait Distance (Feet): 175 Feet Assistive device: Rolling walker (2 wheels) Gait Pattern/deviations: Step-through pattern Gait velocity: decreased     General Gait Details: patient ambulated around the nursing station with rolling walker with no loss of balance. mild unsteadiness initially that improved with increased gait distance. encouraged patient to use rolling walker instead of her cane for safety and fall prevention at this time   Stairs             Wheelchair Mobility    Modified Rankin (Stroke Patients Only)       Balance                                            Cognition Arousal/Alertness: Awake/alert Behavior During Therapy: WFL for tasks assessed/performed Overall Cognitive Status: Within Functional Limits for tasks assessed                                          Exercises      General Comments General comments (skin integrity, edema, etc.): patient was perform peri-hygiene after bowel movement with supervision for safety but no physical assistance required      Pertinent Vitals/Pain Pain Assessment Pain Assessment: No/denies pain    Home Living  Prior Function            PT Goals (current goals can now be found in the care plan section) Acute Rehab PT Goals Patient Stated Goal: to go home PT Goal Formulation: With patient Time For Goal Achievement: 12/09/21 Potential to Achieve Goals: Good Progress towards PT goals: Progressing toward goals    Frequency    Min 2X/week      PT Plan Current plan remains appropriate    Co-evaluation              AM-PAC  PT "6 Clicks" Mobility   Outcome Measure  Help needed turning from your back to your side while in a flat bed without using bedrails?: None Help needed moving from lying on your back to sitting on the side of a flat bed without using bedrails?: None Help needed moving to and from a bed to a chair (including a wheelchair)?: A Little Help needed standing up from a chair using your arms (e.g., wheelchair or bedside chair)?: A Little Help needed to walk in hospital room?: A Little Help needed climbing 3-5 steps with a railing? : A Little 6 Click Score: 20    End of Session   Activity Tolerance: Patient tolerated treatment well Patient left: in chair;with family/visitor present;with call bell/phone within reach   PT Visit Diagnosis: Unsteadiness on feet (R26.81);Muscle weakness (generalized) (M62.81);Other abnormalities of gait and mobility (R26.89)     Time: 5102-5852 PT Time Calculation (min) (ACUTE ONLY): 22 min  Charges:  $Gait Training: 8-22 mins                     Minna Merritts, PT, MPT    Percell Locus 11/29/2021, 3:30 PM

## 2021-11-29 NOTE — Progress Notes (Signed)
[facility]  ROUNDING NOTE   Subjective:   April [name] is a 81 year old female with past medical history including diastolic CHF, anemia, CAD, paroxysmal atrial fibrillation without anticoagulation.  GI bleed.  Chronic kidney disease stage IV.  Patient reports to ED with complaints of lower extremity edema.  Patient has been admitted for Acute CHF (congestive heart failure) ([place]) [I50.9] Acute decompensated heart failure ([place]) [I50.9]  Patient is known to our practice and is followed outpatient by Dr. Candiss Norse.    Patient seen resting in bed, alert and oriented States she continues to urinate frequently on furosemide drip Poor appetite for the past 1 to 2 days however improving today Denies nausea and vomiting Denies pain or discomfort Lower extremity edema improving States she is awaiting thigh-high support holes from outpatient clinic and the same clinic states they can wrap her legs as well   Objective:  Vital signs in last 24 hours:  Temp:  [97.4 F (36.3 C)-98.6 F (37 C)] 98 F (36.7 C) (08/01 1135) Pulse Rate:  [62-72] 62 (08/01 1135) Resp:  [14-20] 18 (08/01 1135) BP: (108-149)/(46-66) 112/49 (08/01 1135) SpO2:  [97 %-100 %] 98 % (08/01 1135) Weight:  [84.1 kg] 84.1 kg (08/01 0427)  Weight change: 3.039 kg Filed Weights   11/26/21 0500 11/28/21 0500 11/29/21 0427  Weight: 81.6 kg 81.1 kg 84.1 kg    Intake/Output: I/O last 3 completed shifts: In: 503.6 [P.O.:480; I.V.:23.6] Out: 1650 [Urine:1650]   Intake/Output this shift:  Total I/O In: 240 [P.O.:240] Out: 600 [Urine:600]  Physical Exam: General: NAD  Head: Normocephalic, atraumatic. Moist oral mucosal membranes  Eyes: Anicteric  Lungs:  Clear to auscultation, normal effort, room air  Heart: Regular rate and rhythm  Abdomen:  Soft, nontender, nondistended  Extremities: 2+ peripheral edema.  Neurologic: Nonfocal, moving all four extremities  Skin: No lesions, dry  Access: None     Basic Metabolic Panel: Recent Labs  Lab 11/24/21 0505 11/25/21 0439 11/26/21 0749 11/27/21 0559 11/28/21 0548 11/28/21 0842 11/29/21 0344  NA 139 137 136 132* 134*  --  131*  K 3.8 3.8 4.0 4.0 3.0*  --  3.3*  CL 106 106 104 101 100  --  100  CO2 '25 24 29 24 25  '$ --  25  GLUCOSE 100* 80 86 81 83  --  116*  BUN 32* 32* 34* 34* 37*  --  39*  CREATININE 2.01* 2.04* 1.93* 1.77* 1.85*  --  2.01*  CALCIUM 8.5* 8.2* 8.2* 8.2* 8.2*  --  8.2*  MG 2.2  --   --   --   --  2.2 2.1     Liver Function Tests: No results for input(s): "AST", "ALT", "ALKPHOS", "BILITOT", "PROT", "ALBUMIN" in the last 168 hours. No results for input(s): "LIPASE", "AMYLASE" in the last 168 hours. No results for input(s): "AMMONIA" in the last 168 hours.  CBC: Recent Labs  Lab 11/23/21 1514 11/23/21 1931 11/24/21 0505 11/28/21 1141  WBC 11.1* 11.7* 9.7 8.8  NEUTROABS  --   --   --  6.3  HGB 9.4* 9.2* 8.2* 9.5*  HCT 28.5* 28.4* 25.4* 29.7*  MCV 91.1 92.2 91.7 93.7  PLT 195 194 169 184     Cardiac Enzymes: No results for input(s): "CKTOTAL", "CKMB", "CKMBINDEX", "TROPONINI" in the last 168 hours.  BNP: Invalid input(s): "POCBNP"  CBG: No results for input(s): "GLUCAP" in the last 168 hours.  Microbiology: Results for orders placed or performed during the hospital encounter  of 07/07/21  Resp Panel by RT-PCR (Flu A&B, Covid) Nasopharyngeal Swab     Status: None   Collection Time: 07/07/21  8:01 PM   Specimen: Nasopharyngeal Swab; Nasopharyngeal(NP) swabs in vial transport medium  Result Value Ref Range Status   SARS Coronavirus 2 by RT PCR NEGATIVE NEGATIVE Final    Comment: (NOTE) SARS-CoV-2 target nucleic acids are NOT DETECTED.  The SARS-CoV-2 RNA is generally detectable in upper respiratory specimens during the acute phase of infection. The lowest concentration of SARS-CoV-2 viral copies this assay can detect is 138 copies/mL. A negative result does not preclude SARS-Cov-2 infection  and should not be used as the sole basis for treatment or other patient management decisions. A negative result may occur with  improper specimen collection/handling, submission of specimen other than nasopharyngeal swab, presence of viral mutation(s) within the areas targeted by this assay, and inadequate number of viral copies(<138 copies/mL). A negative result must be combined with clinical observations, patient history, and epidemiological information. The expected result is Negative.  Fact Sheet for Patients:  EntrepreneurPulse.com.au  Fact Sheet for Healthcare Providers:  IncredibleEmployment.be  This test is no t yet approved or cleared by the [place] FDA and  has been authorized for detection and/or diagnosis of SARS-CoV-2 by FDA under an Emergency Use Authorization (EUA). This EUA will remain  in effect (meaning this test can be used) for the duration of the COVID-19 declaration under Section 564(b)(1) of the Act, 21 U.S.C.section 360bbb-3(b)(1), unless the authorization is terminated  or revoked sooner.       Influenza A by PCR NEGATIVE NEGATIVE Final   Influenza B by PCR NEGATIVE NEGATIVE Final    Comment: (NOTE) The Xpert Xpress SARS-CoV-2/FLU/RSV plus assay is intended as an aid in the diagnosis of influenza from Nasopharyngeal swab specimens and should not be used as a sole basis for treatment. Nasal washings and aspirates are unacceptable for Xpert Xpress SARS-CoV-2/FLU/RSV testing.  Fact Sheet for Patients: EntrepreneurPulse.com.au  Fact Sheet for Healthcare Providers: IncredibleEmployment.be  This test is not yet approved or cleared by the [place] FDA and has been authorized for detection and/or diagnosis of SARS-CoV-2 by FDA under an Emergency Use Authorization (EUA). This EUA will remain in effect (meaning this test can be used) for the duration of the COVID-19 declaration  under Section 564(b)(1) of the Act, 21 U.S.C. section 360bbb-3(b)(1), unless the authorization is terminated or revoked.  Performed at [facility], [address] [place], [place] [address]     Coagulation Studies: No results for input(s): "LABPROT", "INR" in the last 72 hours.  Urinalysis: No results for input(s): "COLORURINE", "LABSPEC", "PHURINE", "GLUCOSEU", "HGBUR", "BILIRUBINUR", "KETONESUR", "PROTEINUR", "UROBILINOGEN", "NITRITE", "LEUKOCYTESUR" in the last 72 hours.  Invalid input(s): "APPERANCEUR"    Imaging: No results found.   Medications:    furosemide (LASIX) 200 mg in dextrose 5 % 100 mL (2 mg/mL) infusion 6 mg/hr (11/28/21 1007)    amiodarone  200 mg Oral Daily   docusate sodium  100 mg Oral BID   enoxaparin (LOVENOX) injection  30 mg Subcutaneous Q24H   nitroGLYCERIN  Assessment/ Plan:  April [name] is a 81 y.o.  female with past medical history including diastolic CHF, anemia, CAD, paroxysmal atrial fibrillation without anticoagulation.  GI bleed.  Chronic kidney disease stage IV.  Patient reports to ED with complaints of lower extremity edema.  Patient has been admitted for Acute CHF (congestive heart failure) ([place]) [I50.9] Acute decompensated heart failure ([place]) [I50.9]  Acute Kidney Injury on chronic kidney disease stage IV with baseline creatinine 1.75 and GFR of 29 on 08/24/21.  Acute kidney injury secondary to volume overload and diuresis.  No IV contrast exposure.  No acute need for dialysis.  Creatinine slightly elevated, not unexpected on furosemide drip.  We will continue to monitor.  Recorded urine output 800 mL in previous 24 hours however patient states she has voided more than reported.  We will continue to monitor and assess daily standing weights.   Lab Results  Component Value Date   CREATININE 2.01 (H) 11/29/2021   CREATININE 1.85 (H) 11/28/2021   CREATININE 1.77 (H) 11/27/2021    Intake/Output Summary (Last 24  hours) at 11/29/2021 1221 Last data filed at 11/29/2021 1133 Gross per 24 hour  Intake 503.57 ml  Output 1400 ml  Net -896.43 ml    2. Chronic diastolic heart failure. ECHO from 08/10/21 shows EF 60-65% with a Grade II diastolic dysfunction with a mildly mitral valve regurgitation.  Managing fluid with furosemide drip.  3. Anemia of chronic kidney disease Lab Results  Component Value Date   HGB 9.5 (L) 11/28/2021    Hemoglobin at goal.   LOS: 5 Kateena Degroote 8/1/202312:21 PM

## 2021-11-29 NOTE — Progress Notes (Deleted)
[facility] CONSULT NOTE  Patient Care Team: Einar Pheasant, MD as PCP - General (Internal Medicine) Rockey Situ Kathlene November, MD as PCP - Cardiology (Cardiology) Vickie Epley, MD as PCP - Electrophysiology (Cardiology) Christene Lye, MD (General Surgery) Kirk Ruths, MD (Internal Medicine) Einar Pheasant, MD as Referring Physician (Internal Medicine) Minna Merritts, MD as Consulting Physician (Cardiology)  CHIEF COMPLAINTS/PURPOSE OF CONSULTATION: ANEMIA   HEMATOLOGY HISTORY  # ANEMIA[Hb; 9.5  MCV-platelets- WBC; Iron sat; ferritin;  GFR- CT/US; EGD/colonoscopy-  # CKD- STAGE IV [Dr.]  HISTORY OF PRESENTING ILLNESS:  April [name] 81 y.o.  female pleasant patient was been referred to [place] for further evaluation of anemia.     Blood in stools: Blood in urine: Difficulty swallowing: Change of bowel movement/constipation: Prior blood transfusion: Prior history of blood loss:  Liver disease: Chronic kidney disease:  Alcohol:  Bariatric surgery:   Vaginal bleeding:  Prior evaluation with hematology: Prior bone marrow biopsy:  Oral iron: ?  Constipation/dyspepsia Prior IV iron infusions:  #Chronic anemia-normocytic recently getting worse.  Patient is quite symptomatic from her anemia . The etiology is unclear; however suspect chronic renal disease.   I had a long discussion with patient regarding multiple etiologies of anemia-including nutritional; malabsorption; chronic kidney disease; primary bone marrow disorders etc.   #I recommend CBC CMP LDH peripheral smear; haptoglobin; erythropoietin; iron studies ferritin E09 folic acid; reticulocyte count; myeloma panel kappa lambda light chain ratio.  Also discussed regarding bone marrow biopsy for diagnosing any primary bone marrow disease.  However, pending above work-up hold off bone marrow biopsy at this time.    # Discussed at length the pathophysiology of anemia from chronic kidney  disease which includes decreased erythropoietin production; and decreased iron stores in the body.  I discussed that I would recommend using iron infusion/Venofer; along with Retacrit to maintain hemoglobin around 10.  I discussed the potential acute infusion reactions with IV iron; which are quite rare.  Patient understands the risk; will proceed with infusions.  Also discussed the role of Retacrit boosting the hemoglobin.  However the goal hemoglobin less than 10 as a increased risk of thromboembolic events in the target hemoglobin is around 12 to 13.   Thank you Dr.Korrapati for allowing me to participate in the care of your pleasant patient. Please do not hesitate to contact me with questions or concerns in the interim.  # DISPOSITION: # labs today- ordered # venofer weekly x3 Follow up in 4 weeks- MD: labs- cbc; possible venofer-Dr.B   Thank you Dr. for allowing me to participate in the care of your pleasant patient. Please do not hesitate to contact me with questions or concerns in the interim.    Review of Systems  Constitutional:  Negative for chills, diaphoresis, fever, malaise/fatigue and weight loss.  HENT:  Negative for nosebleeds and sore throat.   Eyes:  Negative for double vision.  Respiratory:  Negative for cough, hemoptysis, sputum production, shortness of breath and wheezing.   Cardiovascular:  Negative for chest pain, palpitations, orthopnea and leg swelling.  Gastrointestinal:  Negative for abdominal pain, blood in stool, constipation, diarrhea, heartburn, melena, nausea and vomiting.  Genitourinary:  Negative for dysuria, frequency and urgency.  Musculoskeletal:  Negative for back pain and joint pain.  Skin: Negative.  Negative for itching and rash.  Neurological:  Negative for dizziness, tingling, focal weakness, weakness and headaches.  Endo/Heme/Allergies:  Does not bruise/bleed easily.  Psychiatric/Behavioral:  Negative for depression. The patient  is not  nervous/anxious and does not have insomnia.      MEDICAL HISTORY:  Past Medical History:  Diagnosis Date   Anemia    CAD S/P PCI pRCA Promus DES 3.5 x 16 (4.1 mm), ostRPDA Promus DES 2.5 x 12 (2.7 mm) 03/25/2016   CAD S/P percutaneous coronary angioplasty    a. 07/2015 MV: No ischemia, EF 66%;  b. 03/2016 Inflat STEMI/PCI: LM nl, LAD 25d, RI nl, LCX nl, OM1/2 nl, RCA 80p (3.5x16 Promus Premier DES), 50p/m, 30d, RPDA 90 (2.5x12 Promu Premier DES); c. 01/2018 Cath ([place], [place]): Patent RCA stents->Med Rx.   CHF (congestive heart failure) ([place])    CKD (chronic kidney disease), stage III ([place])    Diabetes mellitus without complication ([place])    Diastolic dysfunction    a. 10/2013 Echo: EF 55-65%, Gr1 DD; b. 01/2018 Echo ([place], [place]): EF 55-60%, Gr2 DD, RVSP 30mHg.   Diverticulitis    Dyspnea    Endometriosis    Family history of adverse reaction to anesthesia    Mother - had to stay in ICU because of breathing difficulties every time she had anesthesia   GERD (gastroesophageal reflux disease)    GI bleed    a. 06/2021 - melena-->2 u PRBCs.   Hiatal hernia    History of colon polyps 08/1994   History of Migraines    Hypercholesterolemia    Hypertension    LBBB (left bundle branch block)    Osteoporosis    PAF ([place]oxysmal [place] fibrillation) [place]    a. 03/2016 Dx @ time of MI; b. CHA2DS2VASc = 5-->Eliquis.   Rheumatic fever    Sleep apnea    CPAP   Spasmodic dysphonia    ST elevation myocardial infarction (STEMI) of inferolateral wall, initial episode of care ([facility] 03/25/2016   ST elevation myocardial infarction (STEMI) of inferolateral wall, initial episode of care ([facility] 03/25/2016   Urine incontinence     SURGICAL HISTORY: Past Surgical History:  Procedure Laterality Date   ABDOMINAL HYSTERECTOMY     ovaries not removed   APPENDECTOMY     was removed during hysterectomy   Breast biopsies     x2   BREAST EXCISIONAL BIOPSY Bilateral  "years ago"   neg   BREAST SURGERY Bilateral    BROW LIFT Bilateral 08/15/2019   Procedure: BLEPHAROPLASTY UPPER EYELID; W/EXCESS SKIN BLEPHAROPTOSIS REPAIR; RESECT EX;  Surgeon: FKarle Starch MD;  [facility]  Service: Ophthalmology;  Laterality: Bilateral;  sleep apnea   CARDIAC CATHETERIZATION  2011   moderate 40% RCA disease   CARDIAC CATHETERIZATION  01/2010   Dr. GSuzie Portela: Only noted 40% RCA   CARDIAC CATHETERIZATION N/A 03/25/2016   Procedure: Left Heart Cath and Coronary Angiography;  Surgeon: DLeonie Man MD;  [facility];  Service: Cardiovascular;  Laterality: N/A;   CARDIAC CATHETERIZATION N/A 03/25/2016   Procedure: Coronary Stent Intervention;  Surgeon: DLeonie Man MD;  [facility];  Service: Cardiovascular;  Laterality: N/A;   COLONOSCOPY  2013   COLONOSCOPY WITH PROPOFOL N/A 07/11/2021   Procedure: COLONOSCOPY WITH PROPOFOL;  Surgeon: WLucilla Lame MD;  [facility];  Service: Endoscopy;  Laterality: N/A;   ESOPHAGOGASTRODUODENOSCOPY (EGD) WITH PROPOFOL N/A 03/17/2015   Procedure: ESOPHAGOGASTRODUODENOSCOPY (EGD) WITH PROPOFOL;  Surgeon: SChristene Lye MD;  Location: [facility] ENDOSCOPY;  Service: Gastroenterology;  Laterality: N/A;   ESOPHAGOGASTRODUODENOSCOPY (EGD) WITH PROPOFOL N/A 07/10/2021   Procedure: ESOPHAGOGASTRODUODENOSCOPY (EGD)  WITH PROPOFOL;  Surgeon: Lin Landsman, MD;  Location: [facility] ENDOSCOPY;  Service: Gastroenterology;  Laterality: N/A;   GIVENS CAPSULE STUDY N/A 07/11/2021   Procedure: GIVENS CAPSULE STUDY;  Surgeon: Lucilla Lame, MD;  [facility];  Service: Endoscopy;  Laterality: N/A;   NM MYOVIEW ([place] [facility])  07/2015   No evidence ischemia or infarction. EF 66%. Low risk   TONSILLECTOMY     TRANSTHORACIC ECHOCARDIOGRAM  10/2013   Normal LV size and function. EF 55-65%. GR 1 DD. Otherwise normal.    SOCIAL HISTORY: Social History   Socioeconomic History    Marital status: Widowed    Spouse name: Not on file   Number of children: 2   Years of education: Not on file   Highest education level: Not on file  Occupational History   Not on file  Tobacco Use   Smoking status: Never   Smokeless tobacco: Never  Vaping Use   Vaping Use: Never used  Substance and Sexual Activity   Alcohol use: No    Alcohol/week: 0.0 standard drinks of alcohol   Drug use: No   Sexual activity: Not Currently    Birth control/protection: Post-menopausal  Other Topics Concern   Not on file  Social History Narrative   Not on file   Social Determinants of Health   Financial Resource Strain: Not on file  Food Insecurity: Not on file  Transportation Needs: Not on file  Physical Activity: Not on file  Stress: Not on file  Social Connections: Not on file  Intimate Partner Violence: Not on file    FAMILY HISTORY: Family History  Problem Relation Age of Onset   Arthritis Mother    Stroke Mother    Hypertension Mother    Osteoporosis Mother    Heart disease Father        MI   Heart attack Father    Stroke Brother    Heart attack Sister    Breast cancer Other        first cousin x 2   Stroke Daughter    Heart attack Daughter    Colon cancer Neg Hx     ALLERGIES:  is allergic to penicillins and sulfa antibiotics.  MEDICATIONS:  No current facility-administered medications for this visit.   No current outpatient medications on file.   Facility-Administered Medications Ordered in Other Visits  Medication Dose Route Frequency Provider Last Rate Last Admin   amiodarone (PACERONE) tablet 200 mg  200 mg Oral Daily Rise Patience, MD   200 mg at 11/29/21 0856   docusate sodium (COLACE) capsule 100 mg  100 mg Oral BID Rise Patience, MD   100 mg at 11/29/21 0856   enoxaparin (LOVENOX) injection 30 mg  30 mg Subcutaneous Q24H Rise Patience, MD   30 mg at 11/27/21 2125   furosemide (LASIX) 200 mg in dextrose 5 % 100 mL (2 mg/mL) infusion   6 mg/hr Intravenous Continuous Ralene Muskrat B, MD 3 mL/hr at 11/28/21 1007 6 mg/hr at 11/28/21 1007   nitroGLYCERIN (NITROSTAT) SL tablet 0.4 mg  0.4 mg Sublingual Q5 min PRN Rise Patience, MD         .  PHYSICAL EXAMINATION:   There were no vitals filed for this visit. There were no vitals filed for this visit.  Physical Exam Vitals and nursing note reviewed.  HENT:     Head: Normocephalic and atraumatic.     Mouth/Throat:     Pharynx: Oropharynx is  clear.  Eyes:     Extraocular Movements: Extraocular movements intact.     Pupils: Pupils are equal, round, and reactive to light.  Cardiovascular:     Rate and Rhythm: Normal rate and regular rhythm.  Pulmonary:     Comments: Decreased breath sounds bilaterally.  Abdominal:     Palpations: Abdomen is soft.  Musculoskeletal:        General: Normal range of motion.     Cervical back: Normal range of motion.  Skin:    General: Skin is warm.  Neurological:     General: No focal deficit present.     Mental Status: She is alert and oriented to person, place, and time.  Psychiatric:        Behavior: Behavior normal.        Judgment: Judgment normal.      LABORATORY DATA:  I have reviewed the data as listed Lab Results  Component Value Date   WBC 8.8 11/28/2021   HGB 9.5 (L) 11/28/2021   HCT 29.7 (L) 11/28/2021   MCV 93.7 11/28/2021   PLT 184 11/28/2021   Recent Labs    08/09/21 0430 08/10/21 0520 10/13/21 1113 10/17/21 1534 10/27/21 1301 11/07/21 1501 11/17/21 1546 11/23/21 1514 11/27/21 0559 11/28/21 0548 11/29/21 0344  NA 138   < >  --    < > 138 135 137   < > 132* 134* 131*  K 3.6   < >  --    < > 4.2 3.2* 4.5   < > 4.0 3.0* 3.3*  CL 105   < >  --    < > 99 93* 100   < > 101 100 100  CO2 24   < >  --    < > _0 < > _1 GLUCOSE 110*   < >  --    < > 106* 133* 139*   < > 81 83 116*  BUN 29*   < >  --    < > 40* 31* 23   < > 34* 37* 39*  CREATININE 1.79*   < >  --    < > 2.08*  2.47* 2.07*   < > 1.77* 1.85* 2.01*  CALCIUM 8.2*   < >  --    < > 9.3 8.6* 8.8   < > 8.2* 8.2* 8.2*  GFRNONAA 28*   < >  --    < > 23* 19*  --    < > 28* 27* 24*  PROT 5.5*   < > 6.2   < > 6.6 6.4* 6.1  --   --   --   --   ALBUMIN 2.4*   < > 2.9*   < > 2.8* 2.4* 2.8*  --   --   --   --   AST 47*   < > 70*   < > 92* 83* 73*  --   --   --   --   ALT 25   < > 36*   < > 45* 36 34  --   --   --   --   ALKPHOS 75   < > 125*   < > 127* 125 127*  --   --   --   --   BILITOT 0.7   < > 1.4*   < > 1.8* 1.6* 1.7*  --   --   --   --  BILIDIR 0.1   < > 0.4*  --  0.6*  --  0.6*  --   --   --   --   IBILI 0.6  --   --   --  1.2*  --   --   --   --   --   --    < > = values in this interval not displayed.     DG Chest 2 View  Result Date: 11/23/2021 CLINICAL DATA:  Shortness of breath, leg swelling EXAM: CHEST - 2 VIEW COMPARISON:  10/20/2021 FINDINGS: Cardiac size at the upper limit of normal. Aortic atherosclerosis. No focal pulmonary opacity. No pleural effusion or pneumothorax. Moderate hiatal hernia. No acute osseous abnormality. IMPRESSION: No acute cardiopulmonary process. Electronically Signed   By: Merilyn Baba M.D.   On: 11/23/2021 15:40   US Abdomen Complete  Result Date: 11/08/2021 CLINICAL DATA:  Elevated liver enzymes EXAM: ABDOMEN ULTRASOUND COMPLETE COMPARISON:  Abdominal ultrasound 03/22/2020 FINDINGS: Gallbladder: No gallstones or wall thickening visualized. No sonographic Murphy sign noted by sonographer. Common bile duct: Diameter: 7 mm Liver: Nodular contour with coarse appearance of the parenchyma consistent with cirrhosis. No focal hepatic mass identified. Portal vein is patent on color Doppler imaging with normal direction of blood flow towards the liver. IVC: No abnormality visualized. Pancreas: Visualized portion unremarkable. Spleen: Size and appearance within normal limits. Right Kidney: Length: 10.1 cm. Echogenicity within normal limits. No mass or hydronephrosis visualized. Left  Kidney: Length: 9.7 cm. Echogenicity within normal limits. 3.3 cm anechoic cyst in the lower pole and subcentimeter cyst in the midpole. No suspicious mass or hydronephrosis visualized. Abdominal aorta: No aneurysm visualized. Other findings: Small volume ascites. IMPRESSION: 1. Hepatic cirrhosis. 2. Small volume ascites. 3. Left renal cysts. Electronically Signed   By: Ofilia Neas M.D.   On: 11/08/2021 12:08    ASSESSMENT & PLAN:   No problem-specific Assessment & Plan notes found for this encounter.    All questions were answered. The patient knows to call the clinic with any problems, questions or concerns.    Cammie Sickle, MD 11/29/2021 1:13 PM

## 2021-11-29 NOTE — Progress Notes (Signed)
PROGRESS NOTE    April [name]  QAS:[id number] DOB: Jul 13, 1940 DOA: 11/23/2021 PCP: Einar Pheasant, MD    Brief Narrative:   81 y.o. female with with history of chronic diastolic CHF last EF measured in April 2023 was 14 to 65% with grade 2 diastolic dysfunction, chronic and disease stage IV, anemia, sleep apnea, CAD status post tenting, history of paroxysmal atrial fibrillation not on anticoagulation secondary to recurrent GI bleed was referred to the ER by patient's cardiologist after patient was found to have increasing peripheral edema.  Patient states over the last 1 week patient has gained at least 4 pounds and has been having increasing peripheral edema.  Patient had gone to her nephrologist 2 days ago and her torsemide was increased from 20 twice daily and it is now 40 twice daily which patient has been taking for last 2 days.  Volume removal has been challenging.  Started on Lasix gtt. on 7/31.  UOP improving.  Nephrology on consult.   Assessment & Plan:   Principal Problem:   Acute on chronic diastolic CHF (congestive heart failure) (HCC) Active Problems:   CAD S/P PCI pRCA Promus DES 3.5 x 16 (4.1 mm), ostRPDA Promus DES 2.5 x 12 (2.7 mm)   Paroxysmal atrial fibrillation (HCC)   Essential hypertension, benign   OSA on CPAP   Acute CHF (congestive heart failure) (HCC)   Acute decompensated heart failure (HCC)  Acute on chronic diastolic congestive heart failure Hypokalemia EF in April 2023 79-89%, grade 2 diastolic dysfunction 4 pound weight gain in the last 1 week Torsemide dosing recently increased Was sent in by outpatient cardiology UOP not accurately recorded Difficult to mobilize fluid Plan: Ensure accurate monitoring of UOP Continue Lasix GTT 6 mg an hour Nephrology consult, case discussed with Dr. Candiss Norse Daily BMP  Close monitoring of electrolytes  Close monitoring of renal function    Obstructive sleep apnea Home CPAP nightly  Coronary disease status  post PCI No chest pain, no concern for ACS at this time Does not appear to be on any antiplatelet agents Defer to outpatient cardiology SL nitro as needed chest pain  Paroxysmal atrial fibrillation Currently in sinus rhythm No anticoagulation secondary to history of GI bleed Continue amiodarone Outpatient cardiology follow-up Can consider inpatient cardiology evaluation  Chronic kidney disease stage IV Creatinine at or near baseline Daily BMP  DVT prophylaxis: SQ Lovenox Code Status: Full Family Communication: None today.  Offered to call but patient declined Disposition Plan: Status is: Inpatient Remains inpatient appropriate because: Acute decompensated heart failure on IV diuretic  Level of care: Telemetry Cardiac  Consultants:  Nephrology  Procedures:  None  Antimicrobials: None   Subjective: Seen and examined.  Resting comfortably in bed.  Appetite improving.  No complaints of pain.  Objective: Vitals:   11/28/21 2332 11/29/21 0427 11/29/21 0735 11/29/21 1135  BP: (!) 149/66 (!) 111/53 (!) 108/48 (!) 112/49  Pulse: 69 62 62 62  Resp: '19 19 19 18  '$ Temp: 98.6 F (37 C) 98.1 F (36.7 C) 98 F (36.7 C) 98 F (36.7 C)  TempSrc: Oral Oral    SpO2: 99% 98% 97% 98%  Weight:  84.1 kg    Height:        Intake/Output Summary (Last 24 hours) at 11/29/2021 1409 Last data filed at 11/29/2021 1133 Gross per 24 hour  Intake 263.57 ml  Output 1100 ml  Net -836.43 ml   Filed Weights   11/26/21 0500 11/28/21 0500 11/29/21 0427  Weight: 81.6 kg 81.1 kg 84.1 kg    Examination:  General exam: NAD Respiratory system: Lungs clear.  Normal work of breathing.  Room air Cardiovascular system: S1-S2, RRR, no murmurs, 2+ pitting edema BLE Gastrointestinal system: Soft, NT/ND, normal bowel sounds Central nervous system: Alert and oriented. No focal neurological deficits. Extremities: Symmetric 5 x 5 power. Skin: No rashes, lesions or ulcers Psychiatry: Judgement and  insight appear normal. Mood & affect appropriate.     Data Reviewed: I have personally reviewed following labs and imaging studies  CBC: Recent Labs  Lab 11/23/21 1514 11/23/21 1931 11/24/21 0505 11/28/21 1141  WBC 11.1* 11.7* 9.7 8.8  NEUTROABS  --   --   --  6.3  HGB 9.4* 9.2* 8.2* 9.5*  HCT 28.5* 28.4* 25.4* 29.7*  MCV 91.1 92.2 91.7 93.7  PLT 195 194 169 397   Basic Metabolic Panel: Recent Labs  Lab 11/24/21 0505 11/25/21 0439 11/26/21 0749 11/27/21 0559 11/28/21 0548 11/28/21 0842 11/29/21 0344  NA 139 137 136 132* 134*  --  131*  K 3.8 3.8 4.0 4.0 3.0*  --  3.3*  CL 106 106 104 101 100  --  100  CO2 '25 24 29 24 25  '$ --  25  GLUCOSE 100* 80 86 81 83  --  116*  BUN 32* 32* 34* 34* 37*  --  39*  CREATININE 2.01* 2.04* 1.93* 1.77* 1.85*  --  2.01*  CALCIUM 8.5* 8.2* 8.2* 8.2* 8.2*  --  8.2*  MG 2.2  --   --   --   --  2.2 2.1   GFR: Estimated Creatinine Clearance: 20.4 mL/min (A) (by C-G formula based on SCr of 2.01 mg/dL (H)). Liver Function Tests: No results for input(s): "AST", "ALT", "ALKPHOS", "BILITOT", "PROT", "ALBUMIN" in the last 168 hours.  No results for input(s): "LIPASE", "AMYLASE" in the last 168 hours. No results for input(s): "AMMONIA" in the last 168 hours. Coagulation Profile: No results for input(s): "INR", "PROTIME" in the last 168 hours. Cardiac Enzymes: No results for input(s): "CKTOTAL", "CKMB", "CKMBINDEX", "TROPONINI" in the last 168 hours. BNP (last 3 results) No results for input(s): "PROBNP" in the last 8760 hours. HbA1C: No results for input(s): "HGBA1C" in the last 72 hours. CBG: No results for input(s): "GLUCAP" in the last 168 hours. Lipid Profile: No results for input(s): "CHOL", "HDL", "LDLCALC", "TRIG", "CHOLHDL", "LDLDIRECT" in the last 72 hours. Thyroid Function Tests: No results for input(s): "TSH", "T4TOTAL", "FREET4", "T3FREE", "THYROIDAB" in the last 72 hours.  Anemia Panel: No results for input(s): "VITAMINB12",  "FOLATE", "FERRITIN", "TIBC", "IRON", "RETICCTPCT" in the last 72 hours. Sepsis Labs: No results for input(s): "PROCALCITON", "LATICACIDVEN" in the last 168 hours.  No results found for this or any previous visit (from the past 240 hour(s)).       Radiology Studies: No results found.      Scheduled Meds:  amiodarone  200 mg Oral Daily   docusate sodium  100 mg Oral BID   enoxaparin (LOVENOX) injection  30 mg Subcutaneous Q24H   Continuous Infusions:  furosemide (LASIX) 200 mg in dextrose 5 % 100 mL (2 mg/mL) infusion 6 mg/hr (11/28/21 1007)     LOS: 5 days    Sidney Ace, MD [facility]   If 7PM-7AM, please contact night-coverage  11/29/2021, 2:09 PM

## 2021-11-29 NOTE — Progress Notes (Signed)
Occupational Therapy Treatment Patient Details Name: April [name] MRN: [id number] DOB: [birth year] Today's Date: 11/29/2021   History of present illness Pt is an 81 y.o. female presenting to hospital 7/26 with increasing peripheral edema.  Pt admitted with acute on chronic diastolic CHF.  PMH includes h/o chronic diastolic CHF, anemia, sleep apnea, CAD s/p tenting, h/o paroxysmal a-fib (not on anticoagulation secondary to recurrent GI bleed), CKD, spasmodic dysphonia, STEMI, urine incontinence, B breast surgery.   OT comments  Pt seen OT tx. Pt received in the recliner, initially declining OT needs but agreeable with minimal encouragement. OT facilitated problem solving for ADL/IADL routines at home and strategies to maximize independence while minimizing over exertion and prioritizing meaningful occupational engagement to maximize quality of life. Problem solved showering, laundry, light meal prep, and activity pacing. Pt verbalized understanding. Pt progressing towards goals, continues to benefit from skilled OT services to maximize safety/indep.    Recommendations for follow up therapy are one component of a multi-disciplinary discharge planning process, led by the attending physician.  Recommendations may be updated based on patient status, additional functional criteria and insurance authorization.    Follow Up Recommendations  Home health OT    Assistance Recommended at Discharge Intermittent Supervision/Assistance  Patient can return home with the following  Assistance with cooking/housework;A little help with bathing/dressing/bathroom   Equipment Recommendations  None recommended by OT    Recommendations for Other Services      Precautions / Restrictions Precautions Precautions: Fall Restrictions Weight Bearing Restrictions: No       Mobility Bed Mobility               General bed mobility comments: Deferred (pt sitting in recliner beginning/end of session)     Transfers                   General transfer comment: declined     Balance                                           ADL either performed or assessed with clinical judgement   ADL                                              Extremity/Trunk Assessment              Vision       Perception     Praxis      Cognition Arousal/Alertness: Awake/alert Behavior During Therapy: WFL for tasks assessed/performed Overall Cognitive Status: Within Functional Limits for tasks assessed                                          Exercises Other Exercises Other Exercises: OT facilitated problem solving for ADL/IADL routines at home and strategies to maximize independence while minimizing over exertion and prioritizing meaningful occupational engagement to maximize quality of life. Problem solved showering, laundry, light meal prep, and activity pacing. Pt verbalized understanding.    Shoulder Instructions       General Comments      Pertinent Vitals/ Pain       Pain Assessment Pain Assessment: No/denies pain  Home Living  Prior Functioning/Environment              Frequency  Min 2X/week        Progress Toward Goals  OT Goals(current goals can now be found in the care plan section)  Progress towards OT goals: Progressing toward goals  Acute Rehab OT Goals Patient Stated Goal: pt would like to return home and be as independent as possible OT Goal Formulation: With patient Time For Goal Achievement: 12/10/21 Potential to Achieve Goals: Good  Plan Discharge plan remains appropriate;Frequency remains appropriate    Co-evaluation                 AM-PAC OT "6 Clicks" Daily Activity     Outcome Measure   Help from another person eating meals?: None Help from another person taking care of personal grooming?: None Help from another  person toileting, which includes using toliet, bedpan, or urinal?: A Little Help from another person bathing (including washing, rinsing, drying)?: A Little Help from another person to put on and taking off regular upper body clothing?: None Help from another person to put on and taking off regular lower body clothing?: A Little 6 Click Score: 21    End of Session    OT Visit Diagnosis: Muscle weakness (generalized) (M62.81)   Activity Tolerance Patient tolerated treatment well   Patient Left in chair;with call bell/phone within reach;with chair alarm set   Nurse Communication          Time: 1010-1023 OT Time Calculation (min): 13 min  Charges: OT General Charges $OT Visit: 1 Visit OT Treatments $Self Care/Home Management : 8-22 mins  Ardeth Perfect., MPH, MS, OTR/L ascom [phone] 11/29/21, 1:15 PM

## 2021-11-29 NOTE — Progress Notes (Signed)
PT Cancellation Note  Patient Details Name: LYNNEA VANDERVOORT MRN: [id number] DOB: 18-Oct-1940   Cancelled Treatment:    Reason Eval/Treat Not Completed:  (patient just got her lunch and is preparing to eat. PT will continue with attempts.)  Minna Merritts, PT, MPT  Percell Locus 11/29/2021, 1:32 PM

## 2021-11-29 NOTE — Progress Notes (Signed)
Mobility Specialist - Progress Note    11/29/21 1059  Mobility  Activity Ambulated with assistance in hallway  Level of Assistance Independent after set-up  Assistive Device Front wheel walker  Activity Response Tolerated well  $Mobility charge 1 Mobility    Pt sitting in chair on RA upon arrival. Pt STS and ambulates 1 lap around NS with RW. Pt returns to recliner with needs in reach.   Gretchen Short  Mobility Specialist  11/29/21 11:01 AM

## 2021-11-30 DIAGNOSIS — I5033 Acute on chronic diastolic (congestive) heart failure: Secondary | ICD-10-CM | POA: Diagnosis not present

## 2021-11-30 LAB — BASIC METABOLIC PANEL
Anion gap: 9 (ref 5–15)
BUN: 41 mg/dL — ABNORMAL HIGH (ref 8–23)
CO2: 27 mmol/L (ref 22–32)
Calcium: 8.3 mg/dL — ABNORMAL LOW (ref 8.9–10.3)
Chloride: 99 mmol/L (ref 98–111)
Creatinine, Ser: 2.13 mg/dL — ABNORMAL HIGH (ref 0.44–1.00)
GFR, Estimated: 23 mL/min — ABNORMAL LOW (ref >60)
Glucose, Bld: 129 mg/dL — ABNORMAL HIGH (ref 70–99)
Potassium: 3.3 mmol/L — ABNORMAL LOW (ref 3.5–5.1)
Sodium: 135 mmol/L (ref 135–145)

## 2021-11-30 LAB — GLUCOSE, CAPILLARY
Glucose-Capillary: 102 mg/dL — ABNORMAL HIGH (ref 70–99)
Glucose-Capillary: 116 mg/dL — ABNORMAL HIGH (ref 70–99)
Glucose-Capillary: 134 mg/dL — ABNORMAL HIGH (ref 70–99)

## 2021-11-30 LAB — TYPE AND SCREEN
ABO/RH(D): A POS
Antibody Screen: NEGATIVE

## 2021-11-30 LAB — PROTIME-INR
INR: 1.3 — ABNORMAL HIGH (ref 0.8–1.2)
Prothrombin Time: 16.3 seconds — ABNORMAL HIGH (ref 11.4–15.2)

## 2021-11-30 LAB — MAGNESIUM: Magnesium: 2.2 mg/dL (ref 1.7–2.4)

## 2021-11-30 LAB — APTT: aPTT: 35 seconds (ref 24–36)

## 2021-11-30 MED ORDER — ACETAMINOPHEN 325 MG PO TABS
325.0000 mg | ORAL_TABLET | Freq: Once | ORAL | Status: AC
Start: 2021-11-30 — End: 2021-11-30
  Administered 2021-11-30: 325 mg via ORAL
  Filled 2021-11-30: qty 1

## 2021-11-30 MED ORDER — ACETAMINOPHEN 325 MG PO TABS
325.0000 mg | ORAL_TABLET | Freq: Once | ORAL | Status: AC
  Administered 2021-11-30: 325 mg via ORAL
  Filled 2021-11-30: qty 1

## 2021-11-30 MED ORDER — POTASSIUM CHLORIDE CRYS ER 20 MEQ PO TBCR
40.0000 meq | EXTENDED_RELEASE_TABLET | Freq: Once | ORAL | Status: AC
  Administered 2021-11-30: 40 meq via ORAL
  Filled 2021-11-30: qty 2

## 2021-11-30 NOTE — Progress Notes (Signed)
PROGRESS NOTE    April [name]  QJF:[id number] DOB: 21-Jan-1941 DOA: 11/23/2021 PCP: Einar Pheasant, MD    Brief Narrative:   81 y.o. female with with history of chronic diastolic CHF last EF measured in April 2023 was 58 to 65% with grade 2 diastolic dysfunction, chronic and disease stage IV, anemia, sleep apnea, CAD status post tenting, history of paroxysmal atrial fibrillation not on anticoagulation secondary to recurrent GI bleed was referred to the ER by patient's cardiologist after patient was found to have increasing peripheral edema.  Patient states over the last 1 week patient has gained at least 4 pounds and has been having increasing peripheral edema.  Patient had gone to her nephrologist 2 days ago and her torsemide was increased from 20 twice daily and it is now 40 twice daily which patient has been taking for last 2 days.  Volume removal has been challenging.  Started on Lasix gtt. on 7/31.  UOP improving.  Nephrology on consult.   Assessment & Plan:   Principal Problem:   Acute on chronic diastolic CHF (congestive heart failure) (HCC) Active Problems:   CAD S/P PCI pRCA Promus DES 3.5 x 16 (4.1 mm), ostRPDA Promus DES 2.5 x 12 (2.7 mm)   Paroxysmal atrial fibrillation (HCC)   Essential hypertension, benign   OSA on CPAP   Acute CHF (congestive heart failure) (HCC)   Acute decompensated heart failure (HCC)  Acute on chronic diastolic congestive heart failure EF in April 2023 89-37%, grade 2 diastolic dysfunction 4 pound weight gain in the last 1 week Torsemide dosing recently increased Was sent in by outpatient cardiology UOP not accurately recorded Difficult to mobilize fluid.  Was started on lasix gtt. Plan: --cont lasix gtt per nephrology  Hypokalemia --monitor and replete PRN  Obstructive sleep apnea --cont CPAP nightly  Coronary disease status post PCI No chest pain, no concern for ACS at this time Does not appear to be on any antiplatelet  agents --outpatient cardio f/u  Paroxysmal atrial fibrillation Currently in sinus rhythm No anticoagulation secondary to history of GI bleed --cont amiodarone  Chronic kidney disease stage IV Creatinine at or near baseline --nephrology consulted   DVT prophylaxis: SQ Lovenox Code Status: Full Family Communication: friend updated at bedside today  Disposition Plan: Status is: Inpatient Remains inpatient appropriate because: Acute decompensated heart failure on IV diuretic  Level of care: Telemetry Cardiac  Consultants:  Nephrology  Procedures:  None  Antimicrobials: None   Subjective: Overnight, pt had an episode of large vomit which was described as coffee-ground, however given neg endoscopy and colonoscopy in March 2023 and no Hgb drop, this was unlikely to be hematemesis.    Pt reported leg swelling improved.  Pt has DOE which is chronic and not changed with diuresis.   Objective: Vitals:   11/30/21 0500 11/30/21 0728 11/30/21 1131 11/30/21 1632  BP:  (!) 110/56 (!) 106/55 (!) 120/52  Pulse:  66 67 74  Resp:  '17 17 18  '$ Temp:  97.7 F (36.5 C) 98.3 F (36.8 C) 97.9 F (36.6 C)  TempSrc:  Oral Oral   SpO2:  98% 96% 99%  Weight: 82.5 kg     Height:        Intake/Output Summary (Last 24 hours) at 11/30/2021 1916 Last data filed at 11/30/2021 1807 Gross per 24 hour  Intake 628.83 ml  Output 1400 ml  Net -771.17 ml   Filed Weights   11/29/21 0427 11/30/21 0423 11/30/21 0500  Weight: 84.1  kg 85.2 kg 82.5 kg    Examination:  Constitutional: NAD, AAOx3, sitting in recliner HEENT: conjunctivae and lids normal, EOMI CV: No cyanosis.   RESP: normal respiratory effort Extremities: edema in BLE with chronic venous stasis changes SKIN: warm, dry Neuro: II - XII grossly intact.   Psych: Normal mood and affect.  Appropriate judgement and reason   Data Reviewed: I have personally reviewed following labs and imaging studies  CBC: Recent Labs  Lab  11/23/21 1931 11/24/21 0505 11/28/21 1141 11/29/21 2329  WBC 11.7* 9.7 8.8 10.7*  NEUTROABS  --   --  6.3  --   HGB 9.2* 8.2* 9.5* 9.0*  HCT 28.4* 25.4* 29.7* 26.7*  MCV 92.2 91.7 93.7 90.2  PLT 194 169 184 734   Basic Metabolic Panel: Recent Labs  Lab 11/24/21 0505 11/25/21 0439 11/27/21 0559 11/28/21 0548 11/28/21 0842 11/29/21 0344 11/29/21 2329 11/30/21 0501  NA 139   < > 132* 134*  --  131* 132* 135  K 3.8   < > 4.0 3.0*  --  3.3* 4.5 3.3*  CL 106   < > 101 100  --  100 97* 99  CO2 25   < > 24 25  --  '25 26 27  '$ GLUCOSE 100*   < > 81 83  --  116* 136* 129*  BUN 32*   < > 34* 37*  --  39* 41* 41*  CREATININE 2.01*   < > 1.77* 1.85*  --  2.01* 2.13* 2.13*  CALCIUM 8.5*   < > 8.2* 8.2*  --  8.2* 8.5* 8.3*  MG 2.2  --   --   --  2.2 2.1  --  2.2   < > = values in this interval not displayed.   GFR: Estimated Creatinine Clearance: 19.1 mL/min (A) (by C-G formula based on SCr of 2.13 mg/dL (H)). Liver Function Tests: Recent Labs  Lab 11/29/21 2329  AST 76*  ALT 32  ALKPHOS 99  BILITOT 1.7*  PROT 6.2*  ALBUMIN 2.4*    No results for input(s): "LIPASE", "AMYLASE" in the last 168 hours. No results for input(s): "AMMONIA" in the last 168 hours. Coagulation Profile: Recent Labs  Lab 11/29/21 2329  INR 1.3*   Cardiac Enzymes: No results for input(s): "CKTOTAL", "CKMB", "CKMBINDEX", "TROPONINI" in the last 168 hours. BNP (last 3 results) No results for input(s): "PROBNP" in the last 8760 hours. HbA1C: No results for input(s): "HGBA1C" in the last 72 hours. CBG: Recent Labs  Lab 11/30/21 0727 11/30/21 1132 11/30/21 1631  GLUCAP 102* 116* 134*   Lipid Profile: No results for input(s): "CHOL", "HDL", "LDLCALC", "TRIG", "CHOLHDL", "LDLDIRECT" in the last 72 hours. Thyroid Function Tests: No results for input(s): "TSH", "T4TOTAL", "FREET4", "T3FREE", "THYROIDAB" in the last 72 hours.  Anemia Panel: No results for input(s): "VITAMINB12", "FOLATE",  "FERRITIN", "TIBC", "IRON", "RETICCTPCT" in the last 72 hours. Sepsis Labs: No results for input(s): "PROCALCITON", "LATICACIDVEN" in the last 168 hours.  No results found for this or any previous visit (from the past 240 hour(s)).       Radiology Studies: No results found.      Scheduled Meds:  amiodarone  200 mg Oral Daily   docusate sodium  100 mg Oral BID   enoxaparin (LOVENOX) injection  30 mg Subcutaneous Q24H   pantoprazole (PROTONIX) IV  40 mg Intravenous BID   Continuous Infusions:  furosemide (LASIX) 200 mg in dextrose 5 % 100 mL (2 mg/mL) infusion 6  mg/hr (11/30/21 0355)     LOS: 6 days    Enzo Bi, MD [facility]   If 7PM-7AM, please contact night-coverage  11/30/2021, 7:16 PM

## 2021-11-30 NOTE — Progress Notes (Signed)
PT Cancellation Note  Patient Details Name: April [name] MRN: [id number] DOB: Dec 17, 1940   Cancelled Treatment:    Reason Eval/Treat Not Completed: Fatigue/lethargy limiting ability to participate. Chart reviewed. Upon entry to room pt resting on CPAP. Pt declining PT participation due to fatigue. Requesting PT or mobility specialist to re-attempt in the afternoon. Will re-attempt as able and medically appropriate per POC.    Salem Caster. Fairly IV, PT, DPT Physical Therapist- [facility]  11/30/2021, 10:52 AM

## 2021-11-30 NOTE — Progress Notes (Signed)
Mobility Specialist - Progress Note    11/30/21 1410  Mobility  Activity Refused mobility  $Mobility charge 1 Mobility   Pt sitting in recliner on RA upon arrival. Pt declines mobility this afternoon due to fatigue.   Gretchen Short  Mobility Specialist  11/30/21 2:11 PM

## 2021-11-30 NOTE — Progress Notes (Addendum)
[facility]  ROUNDING NOTE   Subjective:   April [name] is a 81 year old female with past medical history including diastolic CHF, anemia, CAD, paroxysmal atrial fibrillation without anticoagulation.  GI bleed.  Chronic kidney disease stage IV.  Patient reports to ED with complaints of lower extremity edema.  Patient has been admitted for Acute CHF (congestive heart failure) ([place]) [I50.9] Acute decompensated heart failure ([place]) [I50.9]  Patient is known to our practice and is followed outpatient by Dr. Candiss Norse.    Patient seen resting comfortably Sitting up in bed completing breakfast.  States appetite has improved States she feels well, remains on room air Lower extremity edema improving   Objective:  Vital signs in last 24 hours:  Temp:  [97.4 F (36.3 C)-98.3 F (36.8 C)] 98.3 F (36.8 C) (08/02 1131) Pulse Rate:  [65-69] 67 (08/02 1131) Resp:  [16-17] 17 (08/02 1131) BP: (103-145)/(48-63) 106/55 (08/02 1131) SpO2:  [93 %-98 %] 96 % (08/02 1131) Weight:  [82.5 kg-85.2 kg] 82.5 kg (08/02 0500)  Weight change: 1.103 kg Filed Weights   11/29/21 0427 11/30/21 0423 11/30/21 0500  Weight: 84.1 kg 85.2 kg 82.5 kg    Intake/Output: I/O last 3 completed shifts: In: 388.8 [P.O.:240; I.V.:98.8; IV Piggyback:50] Out: 1100 [Urine:1100]   Intake/Output this shift:  Total I/O In: 240 [P.O.:240] Out: 750 [Urine:750]  Physical Exam: General: NAD  Head: Normocephalic, atraumatic. Moist oral mucosal membranes  Eyes: Anicteric  Lungs:  Clear to auscultation, normal effort, room air  Heart: Regular rate and rhythm  Abdomen:  Soft, nontender, nondistended  Extremities: 2+ peripheral edema.  Neurologic: Nonfocal, moving all four extremities  Skin: No lesions, dry  Access: None    Basic Metabolic Panel: Recent Labs  Lab 11/24/21 0505 11/25/21 0439 11/27/21 0559 11/28/21 0548 11/28/21 0842 11/29/21 0344 11/29/21 2329 11/30/21 0501  NA 139   < > 132* 134*   --  131* 132* 135  K 3.8   < > 4.0 3.0*  --  3.3* 4.5 3.3*  CL 106   < > 101 100  --  100 97* 99  CO2 25   < > 24 25  --  '25 26 27  '$ GLUCOSE 100*   < > 81 83  --  116* 136* 129*  BUN 32*   < > 34* 37*  --  39* 41* 41*  CREATININE 2.01*   < > 1.77* 1.85*  --  2.01* 2.13* 2.13*  CALCIUM 8.5*   < > 8.2* 8.2*  --  8.2* 8.5* 8.3*  MG 2.2  --   --   --  2.2 2.1  --  2.2   < > = values in this interval not displayed.     Liver Function Tests: Recent Labs  Lab 11/29/21 2329  AST 76*  ALT 32  ALKPHOS 99  BILITOT 1.7*  PROT 6.2*  ALBUMIN 2.4*   No results for input(s): "LIPASE", "AMYLASE" in the last 168 hours. No results for input(s): "AMMONIA" in the last 168 hours.  CBC: Recent Labs  Lab 11/23/21 1514 11/23/21 1931 11/24/21 0505 11/28/21 1141 11/29/21 2329  WBC 11.1* 11.7* 9.7 8.8 10.7*  NEUTROABS  --   --   --  6.3  --   HGB 9.4* 9.2* 8.2* 9.5* 9.0*  HCT 28.5* 28.4* 25.4* 29.7* 26.7*  MCV 91.1 92.2 91.7 93.7 90.2  PLT 195 194 169 184 207     Cardiac Enzymes: No results for input(s): "CKTOTAL", "CKMB", "CKMBINDEX", "TROPONINI"  in the last 168 hours.  BNP: Invalid input(s): "POCBNP"  CBG: Recent Labs  Lab 11/30/21 0727 11/30/21 1132  GLUCAP 102* 116*    Microbiology: Results for orders placed or performed during the hospital encounter of 07/07/21  Resp Panel by RT-PCR (Flu A&B, Covid) Nasopharyngeal Swab     Status: None   Collection Time: 07/07/21  8:01 PM   Specimen: Nasopharyngeal Swab; Nasopharyngeal(NP) swabs in vial transport medium  Result Value Ref Range Status   SARS Coronavirus 2 by RT PCR NEGATIVE NEGATIVE Final    Comment: (NOTE) SARS-CoV-2 target nucleic acids are NOT DETECTED.  The SARS-CoV-2 RNA is generally detectable in upper respiratory specimens during the acute phase of infection. The lowest concentration of SARS-CoV-2 viral copies this assay can detect is 138 copies/mL. A negative result does not preclude SARS-Cov-2 infection and  should not be used as the sole basis for treatment or other patient management decisions. A negative result may occur with  improper specimen collection/handling, submission of specimen other than nasopharyngeal swab, presence of viral mutation(s) within the areas targeted by this assay, and inadequate number of viral copies(<138 copies/mL). A negative result must be combined with clinical observations, patient history, and epidemiological information. The expected result is Negative.  Fact Sheet for Patients:  EntrepreneurPulse.com.au  Fact Sheet for Healthcare Providers:  IncredibleEmployment.be  This test is no t yet approved or cleared by the [place] FDA and  has been authorized for detection and/or diagnosis of SARS-CoV-2 by FDA under an Emergency Use Authorization (EUA). This EUA will remain  in effect (meaning this test can be used) for the duration of the COVID-19 declaration under Section 564(b)(1) of the Act, 21 U.S.C.section 360bbb-3(b)(1), unless the authorization is terminated  or revoked sooner.       Influenza A by PCR NEGATIVE NEGATIVE Final   Influenza B by PCR NEGATIVE NEGATIVE Final    Comment: (NOTE) The Xpert Xpress SARS-CoV-2/FLU/RSV plus assay is intended as an aid in the diagnosis of influenza from Nasopharyngeal swab specimens and should not be used as a sole basis for treatment. Nasal washings and aspirates are unacceptable for Xpert Xpress SARS-CoV-2/FLU/RSV testing.  Fact Sheet for Patients: EntrepreneurPulse.com.au  Fact Sheet for Healthcare Providers: IncredibleEmployment.be  This test is not yet approved or cleared by the [place] FDA and has been authorized for detection and/or diagnosis of SARS-CoV-2 by FDA under an Emergency Use Authorization (EUA). This EUA will remain in effect (meaning this test can be used) for the duration of the COVID-19 declaration  under Section 564(b)(1) of the Act, 21 U.S.C. section 360bbb-3(b)(1), unless the authorization is terminated or revoked.  Performed at [facility], [address] [place], [place] [address]     Coagulation Studies: Recent Labs    11/29/21 2329  LABPROT 16.3*  INR 1.3*    Urinalysis: No results for input(s): "COLORURINE", "LABSPEC", "PHURINE", "GLUCOSEU", "HGBUR", "BILIRUBINUR", "KETONESUR", "PROTEINUR", "UROBILINOGEN", "NITRITE", "LEUKOCYTESUR" in the last 72 hours.  Invalid input(s): "APPERANCEUR"    Imaging: No results found.   Medications:    furosemide (LASIX) 200 mg in dextrose 5 % 100 mL (2 mg/mL) infusion 6 mg/hr (11/30/21 0355)    amiodarone  200 mg Oral Daily   docusate sodium  100 mg Oral BID   enoxaparin (LOVENOX) injection  30 mg Subcutaneous Q24H   pantoprazole (PROTONIX) IV  40 mg Intravenous BID   nitroGLYCERIN  Assessment/ Plan:  Ms. CHALON ZOBRIST is a 81 y.o.  female with past medical  history including diastolic CHF, anemia, CAD, paroxysmal atrial fibrillation without anticoagulation.  GI bleed.  Chronic kidney disease stage IV.  Patient reports to ED with complaints of lower extremity edema.  Patient has been admitted for Acute CHF (congestive heart failure) ([place]) [I50.9] Acute decompensated heart failure ([place]) [I50.9]   Acute Kidney Injury on chronic kidney disease stage IV with baseline creatinine 1.75 and GFR of 29 on 08/24/21.  Acute kidney injury secondary to volume overload and diuresis.  No IV contrast exposure.  No acute need for dialysis.  Creatinine stable today.  Patient reports frequent urination.  Daily weight 82.5 kg, down from 84.1kg yesterday.  We will continue the current course of treatment.  We will consider low-dose metolazone once a week at discharge.   Lab Results  Component Value Date   CREATININE 2.13 (H) 11/30/2021   CREATININE 2.13 (H) 11/29/2021   CREATININE 2.01 (H) 11/29/2021    Intake/Output Summary  (Last 24 hours) at 11/30/2021 1316 Last data filed at 11/30/2021 1134 Gross per 24 hour  Intake 388.83 ml  Output 750 ml  Net -361.17 ml    2. Chronic diastolic heart failure. ECHO from 08/10/21 shows EF 60-65% with a Grade II diastolic dysfunction with a mildly mitral valve regurgitation.  Furosemide drip in place.  3. Anemia of chronic kidney disease Lab Results  Component Value Date   HGB 9.0 (L) 11/29/2021    Hemoglobin acceptable   LOS: 6 Kenderick Kobler 8/2/20231:16 PM

## 2021-11-30 NOTE — Progress Notes (Addendum)
Name: April [name] MRN: [id number] DOB: 09-12-1940     Subjective: Nurse reports Lovenox held last night secondary to [name] tarry stools and inquiring if Lovenox should be held tonight after patient reports she's had additional [name] tarry stools today. Also reports nausea and coffee ground emesis new tonight.    Objective: April [name] is a 81 yo F with PMH CHF EF 60-65%, anemia, OSA, CAD s/p stenting, pAF not on anticoagulation secondary to recurrent GI bleed who presented to [facility] after outpatient Cardiology visit where she had increasing peripheral edema  Visual inspection of emesis in toilet tonight is maroon in color and appears gritty (see picture below or in media tab)  Upper Endoscopy and Colonoscopy in March of this year did not find an explanation for her symptoms  Today's Vitals   11/29/21 2055 11/29/21 2325 11/30/21 0423 11/30/21 0500  BP:  (!) 145/63 (!) 112/48   Pulse:  69 65   Resp:  17 17   Temp:  97.6 F (36.4 C) 98 F (36.7 C)   TempSrc:  Oral    SpO2:  98% 97%   Weight:   85.2 kg   Height:      PainSc: 0-No pain   8    Body mass index is 36.68 kg/m.     Latest Ref Rng & Units 11/29/2021   11:29 PM 11/28/2021   11:41 AM 11/24/2021    5:05 AM  CBC  WBC 4.0 - 10.5 K/uL 10.7  8.8  9.7   Hemoglobin 12.0 - 15.0 g/dL 9.0  9.5  8.2   Hematocrit 36.0 - 46.0 % 26.7  29.7  25.4   Platelets 150 - 400 K/uL 207  184  169       Latest Ref Rng & Units 11/29/2021   11:29 PM 11/29/2021    3:44 AM 11/28/2021    5:48 AM  CMP  Glucose 70 - 99 mg/dL 136  116  83   BUN 8 - 23 mg/dL 41  39  37   Creatinine 0.44 - 1.00 mg/dL 2.13  2.01  1.85   Sodium 135 - 145 mmol/L 132  131  134   Potassium 3.5 - 5.1 mmol/L 4.5  3.3  3.0   Chloride 98 - 111 mmol/L 97  100  100   CO2 22 - 32 mmol/L '26  25  25   '$ Calcium 8.9 - 10.3 mg/dL 8.5  8.2  8.2   Total Protein 6.5 - 8.1 g/dL 6.2     Total Bilirubin 0.3 - 1.2 mg/dL 1.7     Alkaline Phos 38 - 126 U/L 99     AST 15 - 41 U/L 76      ALT 0 - 44 U/L 32       Magnesium    Component Value Date/Time   Magnesium 2.1 11/30/2021 0501   APTT    Component Value Date/Time   APTT 35 11/29/2021 2329   PT/INR    Component Value Date/Time   Prothrombin Time INR 16.3 1.3 11/29/2021 2329      Picture below of emesis in toilet     Physical Exam:   General: Adult female, lying in bed intubated NAD, appears uncomfortable HEENT: MM pink/moist, anicteric, atraumatic, neck supple Neuro: A&O x 4, follows commands, PERRL  CV: S1S2, RRR, no rub/murmur/gallop Pulm: Regular, non labored on room air , breath sounds clear-BUL & clear-BLL GI: soft, non-distended, tender to deep palpation of LLQ, bs(+) x  4 GU: deferred Skin:  no rashes/lesions noted Extremities: warm/dry, pulses + 2 Radial and Pedal pulses, +2 pitting edema noted in BLE, reports Leg pain  Review of Systems  Constitutional: Negative.   HENT: Negative.    Eyes: Negative.   Respiratory: Negative.    Cardiovascular:  Positive for leg swelling. Negative for palpitations.  Gastrointestinal:  Positive for constipation, melena, nausea and vomiting. Negative for diarrhea.  Genitourinary: Negative.   Musculoskeletal: Negative.   Skin: Negative.   Neurological: Negative.   Endo/Heme/Allergies: Negative.   Psychiatric/Behavioral: Negative.       Assessment/Plan:   Possible GI Bleed Hold Lovenox NPO Protonix 40 mg BID Phenergan x1 - patient reports an intolerance to Zofran CBC, CMP, PTT, PT/INR, Type/Screen Consider GI consultation in AM  Musculoskeletal Pain-Leg Pain 2. Tylenol 325 mg PO x1   This document was prepared using Dragon voice recognition software and may include unintentional dictation errors.  Neomia Glass DNP, MHA, FNP-BC Nurse Practitioner [facility] Pager [phone]

## 2021-11-30 NOTE — Progress Notes (Signed)
Physical Therapy Treatment Patient Details Name: April [name] MRN: [id number] DOB: [birth year] Today's Date: 11/30/2021   History of Present Illness Pt is an 81 y.o. female presenting to hospital 7/26 with increasing peripheral edema.  Pt admitted with acute on chronic diastolic CHF.  PMH includes h/o chronic diastolic CHF, anemia, sleep apnea, CAD s/p tenting, h/o paroxysmal a-fib (not on anticoagulation secondary to recurrent GI bleed), CKD, spasmodic dysphonia, STEMI, urine incontinence, B breast surgery.    PT Comments    Patient alert, agreeable to PT with some encouragement, denied pain. Bed mobility performed modI, and sit <> Stand from EOB and BSC with supervision with RW. Definite use of hands noted to be needed but no LOB. She was able to ambulate to bathroom to void (pericare independent), and then ambulated ~144f total with RW and CGA-supervision. No LOB noted, reciprocal gait pattern, mildly decreased gait velocity. Pt up in chair with all needs in reach at end of session. The patient would benefit from further skilled PT intervention to continue to progress towards goals. Recommendation remains appropriate.     Recommendations for follow up therapy are one component of a multi-disciplinary discharge planning process, led by the attending physician.  Recommendations may be updated based on patient status, additional functional criteria and insurance authorization.  Follow Up Recommendations  Home health PT     Assistance Recommended at Discharge Set up Supervision/Assistance  Patient can return home with the following A little help with bathing/dressing/bathroom;Assistance with cooking/housework;Assist for transportation;Help with stairs or ramp for entrance   Equipment Recommendations  Rolling walker (2 wheels);BSC/3in1    Recommendations for Other Services       Precautions / Restrictions Precautions Precautions: Fall Restrictions Weight Bearing Restrictions: No      Mobility  Bed Mobility Overal bed mobility: Modified Independent                  Transfers Overall transfer level: Needs assistance Equipment used: Rolling walker (2 wheels) Transfers: Sit to/from Stand Sit to Stand: Supervision                Ambulation/Gait Ambulation/Gait assistance: Supervision Gait Distance (Feet): 190 Feet Assistive device: Rolling walker (2 wheels) Gait Pattern/deviations: Step-through pattern           Stairs             Wheelchair Mobility    Modified Rankin (Stroke Patients Only)       Balance Overall balance assessment: Needs assistance Sitting-balance support: No upper extremity supported, Feet supported Sitting balance-Leahy Scale: Normal Sitting balance - Comments: performed pericare independently     Standing balance-Leahy Scale: Good                              Cognition Arousal/Alertness: Awake/alert Behavior During Therapy: WFL for tasks assessed/performed Overall Cognitive Status: Within Functional Limits for tasks assessed                                          Exercises      General Comments        Pertinent Vitals/Pain Pain Assessment Pain Assessment: No/denies pain    Home Living                          Prior Function  PT Goals (current goals can now be found in the care plan section) Progress towards PT goals: Progressing toward goals    Frequency    Min 2X/week      PT Plan Current plan remains appropriate    Co-evaluation              AM-PAC PT "6 Clicks" Mobility   Outcome Measure  Help needed turning from your back to your side while in a flat bed without using bedrails?: None Help needed moving from lying on your back to sitting on the side of a flat bed without using bedrails?: None Help needed moving to and from a bed to a chair (including a wheelchair)?: None Help needed standing up from a chair  using your arms (e.g., wheelchair or bedside chair)?: None Help needed to walk in hospital room?: A Little Help needed climbing 3-5 steps with a railing? : A Little 6 Click Score: 22    End of Session Equipment Utilized During Treatment: Gait belt Activity Tolerance: Patient tolerated treatment well Patient left: in chair;with call bell/phone within reach;with chair alarm set Nurse Communication: Mobility status PT Visit Diagnosis: Unsteadiness on feet (R26.81);Muscle weakness (generalized) (M62.81);Other abnormalities of gait and mobility (R26.89)     Time: 8016-5537 PT Time Calculation (min) (ACUTE ONLY): 28 min  Charges:  $Therapeutic Activity: 23-37 mins                     Lieutenant Diego PT, DPT 2:37 PM,11/30/21

## 2021-12-01 DIAGNOSIS — I5033 Acute on chronic diastolic (congestive) heart failure: Secondary | ICD-10-CM | POA: Diagnosis not present

## 2021-12-01 LAB — CBC
HCT: 23.3 % — ABNORMAL LOW (ref 36.0–46.0)
Hemoglobin: 8.2 g/dL — ABNORMAL LOW (ref 12.0–15.0)
MCH: 31.2 pg (ref 26.0–34.0)
MCHC: 35.2 g/dL (ref 30.0–36.0)
MCV: 88.6 fL (ref 80.0–100.0)
Platelets: 179 10*3/uL (ref 150–400)
RBC: 2.63 MIL/uL — ABNORMAL LOW (ref 3.87–5.11)
RDW: 21 % — ABNORMAL HIGH (ref 11.5–15.5)
WBC: 8.8 10*3/uL (ref 4.0–10.5)
nRBC: 0 % (ref 0.0–0.2)

## 2021-12-01 LAB — BASIC METABOLIC PANEL
Anion gap: 9 (ref 5–15)
BUN: 42 mg/dL — ABNORMAL HIGH (ref 8–23)
CO2: 24 mmol/L (ref 22–32)
Calcium: 8.3 mg/dL — ABNORMAL LOW (ref 8.9–10.3)
Chloride: 100 mmol/L (ref 98–111)
Creatinine, Ser: 2.12 mg/dL — ABNORMAL HIGH (ref 0.44–1.00)
GFR, Estimated: 23 mL/min — ABNORMAL LOW (ref >60)
Glucose, Bld: 97 mg/dL (ref 70–99)
Potassium: 3.4 mmol/L — ABNORMAL LOW (ref 3.5–5.1)
Sodium: 133 mmol/L — ABNORMAL LOW (ref 135–145)

## 2021-12-01 LAB — MAGNESIUM: Magnesium: 2.3 mg/dL (ref 1.7–2.4)

## 2021-12-01 MED ORDER — PANTOPRAZOLE SODIUM 40 MG PO TBEC
40.0000 mg | DELAYED_RELEASE_TABLET | Freq: Two times a day (BID) | ORAL | Status: DC
  Administered 2021-12-01 – 2021-12-19 (×37): 40 mg via ORAL
  Filled 2021-12-01 (×37): qty 1

## 2021-12-01 MED ORDER — PANTOPRAZOLE SODIUM 40 MG PO TBEC: 40.0000 mg | DELAYED_RELEASE_TABLET | Freq: Two times a day (BID) | ORAL | Status: DC

## 2021-12-01 MED ORDER — ACETAMINOPHEN 500 MG PO TABS
500.0000 mg | ORAL_TABLET | Freq: Four times a day (QID) | ORAL | Status: DC | PRN
  Administered 2021-12-01 – 2021-12-19 (×20): 500 mg via ORAL
  Filled 2021-12-01 (×20): qty 1

## 2021-12-01 MED ORDER — POTASSIUM CHLORIDE CRYS ER 20 MEQ PO TBCR
40.0000 meq | EXTENDED_RELEASE_TABLET | Freq: Once | ORAL | Status: AC
  Administered 2021-12-01: 40 meq via ORAL
  Filled 2021-12-01: qty 2

## 2021-12-01 NOTE — Consult Note (Signed)
   [facility] Inpatient Consult   12/01/2021  CHANELLE HODSDON Nov 27, 1940 [id number]  [place] [facility] [ACO] Patient: Medicare ACO REACH  *Remote coverage review for [facility]  Primary Care Provider:  Einar Pheasant, MD, with [facility], is an embedded provider with a Care Coordination team and program, and is listed for the transition of care follow up and appointments.  Patient was screened for readmission prevention needs with high risk scores for unplanned readmission risk with 4 hospitalizations in the past 6 months noted in this health system.Reviewed to assess for Embedded practice service needs with care coordination. Chart review for post hospital needs reviewed with inpatient [facility] team and PT/OT recommendations is noted currently for home with home health care and patient at [facility] [place] noted.  Plan: Continue to follow for post hospital needs. A referral can be made for post hospital care coordination closer to transitioning.  Please contact for further questions,  Natividad Brood, RN BSN [facility] Liaison  [phone] business mobile phone Toll free office [phone]  Fax number: [phone] [email or web address]

## 2021-12-01 NOTE — Progress Notes (Signed)
PROGRESS NOTE    April [name]  SEG:[id number] DOB: 02/17/1941 DOA: 11/23/2021 PCP: April Pheasant, MD    Brief Narrative:   81 y.o. female with with history of chronic diastolic CHF last EF measured in April 2023 was 48 to 65% with grade 2 diastolic dysfunction, chronic and disease stage IV, anemia, sleep apnea, CAD status post tenting, history of paroxysmal atrial fibrillation not on anticoagulation secondary to recurrent GI bleed was referred to the ER by patient's cardiologist after patient was found to have increasing peripheral edema.  Patient states over the last 1 week patient has gained at least 4 pounds and has been having increasing peripheral edema.  Patient had gone to her nephrologist 2 days ago and her torsemide was increased from 20 twice daily and it is now 40 twice daily which patient has been taking for last 2 days.  Volume removal has been challenging.  Started on Lasix gtt. on 7/31.  UOP improving.  Nephrology on consult.   Assessment & Plan:   Principal Problem:   Acute on chronic diastolic CHF (congestive heart failure) (HCC) Active Problems:   CAD S/P PCI pRCA Promus DES 3.5 x 16 (4.1 mm), ostRPDA Promus DES 2.5 x 12 (2.7 mm)   Paroxysmal atrial fibrillation (HCC)   Essential hypertension, benign   OSA on CPAP   Acute CHF (congestive heart failure) (HCC)   Acute decompensated heart failure (HCC)  Acute on chronic diastolic congestive heart failure EF in April 2023 73-71%, grade 2 diastolic dysfunction 4 pound weight gain in the last 1 week Torsemide dosing recently increased Was sent in by outpatient cardiology UOP not accurately recorded Difficult to mobilize fluid.  Was started on lasix gtt. Plan: --cont lasix gtt per nephrology  Hypokalemia --monitor and replete PRN  Obstructive sleep apnea --cont CPAP nightly  Coronary disease status post PCI No chest pain, no concern for ACS at this time Does not appear to be on any antiplatelet  agents --outpatient cardio f/u  Paroxysmal atrial fibrillation Currently in sinus rhythm No anticoagulation secondary to history of GI bleed --cont amiodarone  Chronic kidney disease stage IV Creatinine at or near baseline --nephrology consulted   DVT prophylaxis: SQ Lovenox Code Status: Full Family Communication:   Disposition Plan: Status is: Inpatient Remains inpatient appropriate because: Acute decompensated heart failure on IV diuretic  Level of care: Telemetry Cardiac  Consultants:  Nephrology  Procedures:  None  Antimicrobials: None   Subjective: Pt was seen walking in the halls with PT.   Objective: Vitals:   12/01/21 0828 12/01/21 1237 12/01/21 1536 12/01/21 1540  BP: (!) 115/50 (!) 112/47 (!) 106/33 (!) 120/43  Pulse: (!) 59 66 69   Resp: '18 18 18   '$ Temp: 98 F (36.7 C)  98.1 F (36.7 C)   TempSrc:   Oral   SpO2: 96% 95% 96%   Weight:      Height:        Intake/Output Summary (Last 24 hours) at 12/01/2021 1836 Last data filed at 11/30/2021 2042 Gross per 24 hour  Intake --  Output 400 ml  Net -400 ml   Filed Weights   11/30/21 0500 12/01/21 0406 12/01/21 0500  Weight: 82.5 kg 84.9 kg 82 kg    Examination:  Constitutional: NAD, AAOx3, walking the halls with walker HEENT: conjunctivae and lids normal, EOMI CV: No cyanosis.   RESP: normal respiratory effort, on RA Extremities: improved edema in BLE Neuro: II - XII grossly intact.   Psych: Normal  mood and affect.     Data Reviewed: I have personally reviewed following labs and imaging studies  CBC: Recent Labs  Lab 11/28/21 1141 11/29/21 2329 12/01/21 0449  WBC 8.8 10.7* 8.8  NEUTROABS 6.3  --   --   HGB 9.5* 9.0* 8.2*  HCT 29.7* 26.7* 23.3*  MCV 93.7 90.2 88.6  PLT 184 207 562   Basic Metabolic Panel: Recent Labs  Lab 11/28/21 0548 11/28/21 0842 11/29/21 0344 11/29/21 2329 11/30/21 0501 12/01/21 0449  NA 134*  --  131* 132* 135 133*  K 3.0*  --  3.3* 4.5 3.3* 3.4*   CL 100  --  100 97* 99 100  CO2 25  --  '25 26 27 24  '$ GLUCOSE 83  --  116* 136* 129* 97  BUN 37*  --  39* 41* 41* 42*  CREATININE 1.85*  --  2.01* 2.13* 2.13* 2.12*  CALCIUM 8.2*  --  8.2* 8.5* 8.3* 8.3*  MG  --  2.2 2.1  --  2.2 2.3   GFR: Estimated Creatinine Clearance: 19.1 mL/min (A) (by C-G formula based on SCr of 2.12 mg/dL (H)). Liver Function Tests: Recent Labs  Lab 11/29/21 2329  AST 76*  ALT 32  ALKPHOS 99  BILITOT 1.7*  PROT 6.2*  ALBUMIN 2.4*    No results for input(s): "LIPASE", "AMYLASE" in the last 168 hours. No results for input(s): "AMMONIA" in the last 168 hours. Coagulation Profile: Recent Labs  Lab 11/29/21 2329  INR 1.3*   Cardiac Enzymes: No results for input(s): "CKTOTAL", "CKMB", "CKMBINDEX", "TROPONINI" in the last 168 hours. BNP (last 3 results) No results for input(s): "PROBNP" in the last 8760 hours. HbA1C: No results for input(s): "HGBA1C" in the last 72 hours. CBG: Recent Labs  Lab 11/30/21 0727 11/30/21 1132 11/30/21 1631  GLUCAP 102* 116* 134*   Lipid Profile: No results for input(s): "CHOL", "HDL", "LDLCALC", "TRIG", "CHOLHDL", "LDLDIRECT" in the last 72 hours. Thyroid Function Tests: No results for input(s): "TSH", "T4TOTAL", "FREET4", "T3FREE", "THYROIDAB" in the last 72 hours.  Anemia Panel: No results for input(s): "VITAMINB12", "FOLATE", "FERRITIN", "TIBC", "IRON", "RETICCTPCT" in the last 72 hours. Sepsis Labs: No results for input(s): "PROCALCITON", "LATICACIDVEN" in the last 168 hours.  No results found for this or any previous visit (from the past 240 hour(s)).       Radiology Studies: No results found.      Scheduled Meds:  amiodarone  200 mg Oral Daily   docusate sodium  100 mg Oral BID   enoxaparin (LOVENOX) injection  30 mg Subcutaneous Q24H   pantoprazole  40 mg Oral BID   Continuous Infusions:  furosemide (LASIX) 200 mg in dextrose 5 % 100 mL (2 mg/mL) infusion 6 mg/hr (12/01/21 0221)      LOS: 7 days    April Bi, MD [facility]   If 7PM-7AM, please contact night-coverage  12/01/2021, 6:36 PM

## 2021-12-01 NOTE — Care Management Important Message (Signed)
Important Message  Patient Details  Name: April [name] MRN: [id number] Date of Birth: [birth year]   Medicare Important Message Given:  Yes     Dannette Barbara 12/01/2021, 1:12 PM

## 2021-12-01 NOTE — Progress Notes (Signed)
[facility]  ROUNDING NOTE   Subjective:   April [name] is a 81 year old female with past medical history including diastolic CHF, anemia, CAD, paroxysmal atrial fibrillation without anticoagulation.  GI bleed.  Chronic kidney disease stage IV.  Patient reports to ED with complaints of lower extremity edema.  Patient has been admitted for Acute CHF (congestive heart failure) ([place]) [I50.9] Acute decompensated heart failure ([place]) [I50.9]  Patient is known to our practice and is followed outpatient by Dr. Candiss Norse.    Patient seen resting quietly, son at bedside Alert and oriented Continues to void frequently Lower extremity edema remains however improved Appetite has improved also  Creatinine stable 2.12 Urine output 1.8 L in preceding 24 hours   Objective:  Vital signs in last 24 hours:  Temp:  [97.6 F (36.4 C)-98 F (36.7 C)] 98 F (36.7 C) (08/03 0828) Pulse Rate:  [59-74] 66 (08/03 1237) Resp:  [18] 18 (08/03 1237) BP: (111-128)/(47-63) 112/47 (08/03 1237) SpO2:  [95 %-100 %] 95 % (08/03 1237) Weight:  [82 kg-84.9 kg] 82 kg (08/03 0500)  Weight change: -0.3 kg Filed Weights   11/30/21 0500 12/01/21 0406 12/01/21 0500  Weight: 82.5 kg 84.9 kg 82 kg    Intake/Output: I/O last 3 completed shifts: In: 628.8 [P.O.:480; I.V.:98.8; IV Piggyback:50] Out: 1800 [Urine:1800]   Intake/Output this shift:  No intake/output data recorded.  Physical Exam: General: NAD  Head: Normocephalic, atraumatic. Moist oral mucosal membranes  Eyes: Anicteric  Lungs:  Clear to auscultation, normal effort, room air  Heart: Regular rate and rhythm  Abdomen:  Soft, nontender, nondistended  Extremities: 2+ peripheral edema.  Neurologic: Nonfocal, moving all four extremities  Skin: No lesions, dry  Access: None    Basic Metabolic Panel: Recent Labs  Lab 11/28/21 0548 11/28/21 0842 11/29/21 0344 11/29/21 2329 11/30/21 0501 12/01/21 0449  NA 134*  --  131* 132* 135  133*  K 3.0*  --  3.3* 4.5 3.3* 3.4*  CL 100  --  100 97* 99 100  CO2 25  --  '25 26 27 24  '$ GLUCOSE 83  --  116* 136* 129* 97  BUN 37*  --  39* 41* 41* 42*  CREATININE 1.85*  --  2.01* 2.13* 2.13* 2.12*  CALCIUM 8.2*  --  8.2* 8.5* 8.3* 8.3*  MG  --  2.2 2.1  --  2.2 2.3     Liver Function Tests: Recent Labs  Lab 11/29/21 2329  AST 76*  ALT 32  ALKPHOS 99  BILITOT 1.7*  PROT 6.2*  ALBUMIN 2.4*    No results for input(s): "LIPASE", "AMYLASE" in the last 168 hours. No results for input(s): "AMMONIA" in the last 168 hours.  CBC: Recent Labs  Lab 11/28/21 1141 11/29/21 2329 12/01/21 0449  WBC 8.8 10.7* 8.8  NEUTROABS 6.3  --   --   HGB 9.5* 9.0* 8.2*  HCT 29.7* 26.7* 23.3*  MCV 93.7 90.2 88.6  PLT 184 207 179     Cardiac Enzymes: No results for input(s): "CKTOTAL", "CKMB", "CKMBINDEX", "TROPONINI" in the last 168 hours.  BNP: Invalid input(s): "POCBNP"  CBG: Recent Labs  Lab 11/30/21 0727 11/30/21 1132 11/30/21 1631  GLUCAP 102* 116* 134*     Microbiology: Results for orders placed or performed during the hospital encounter of 07/07/21  Resp Panel by RT-PCR (Flu A&B, Covid) Nasopharyngeal Swab     Status: None   Collection Time: 07/07/21  8:01 PM   Specimen: Nasopharyngeal Swab; Nasopharyngeal(NP) swabs  in vial transport medium  Result Value Ref Range Status   SARS Coronavirus 2 by RT PCR NEGATIVE NEGATIVE Final    Comment: (NOTE) SARS-CoV-2 target nucleic acids are NOT DETECTED.  The SARS-CoV-2 RNA is generally detectable in upper respiratory specimens during the acute phase of infection. The lowest concentration of SARS-CoV-2 viral copies this assay can detect is 138 copies/mL. A negative result does not preclude SARS-Cov-2 infection and should not be used as the sole basis for treatment or other patient management decisions. A negative result may occur with  improper specimen collection/handling, submission of specimen other than nasopharyngeal  swab, presence of viral mutation(s) within the areas targeted by this assay, and inadequate number of viral copies(<138 copies/mL). A negative result must be combined with clinical observations, patient history, and epidemiological information. The expected result is Negative.  Fact Sheet for Patients:  EntrepreneurPulse.com.au  Fact Sheet for Healthcare Providers:  IncredibleEmployment.be  This test is no t yet approved or cleared by the [place] FDA and  has been authorized for detection and/or diagnosis of SARS-CoV-2 by FDA under an Emergency Use Authorization (EUA). This EUA will remain  in effect (meaning this test can be used) for the duration of the COVID-19 declaration under Section 564(b)(1) of the Act, 21 U.S.C.section 360bbb-3(b)(1), unless the authorization is terminated  or revoked sooner.       Influenza A by PCR NEGATIVE NEGATIVE Final   Influenza B by PCR NEGATIVE NEGATIVE Final    Comment: (NOTE) The Xpert Xpress SARS-CoV-2/FLU/RSV plus assay is intended as an aid in the diagnosis of influenza from Nasopharyngeal swab specimens and should not be used as a sole basis for treatment. Nasal washings and aspirates are unacceptable for Xpert Xpress SARS-CoV-2/FLU/RSV testing.  Fact Sheet for Patients: EntrepreneurPulse.com.au  Fact Sheet for Healthcare Providers: IncredibleEmployment.be  This test is not yet approved or cleared by the [place] FDA and has been authorized for detection and/or diagnosis of SARS-CoV-2 by FDA under an Emergency Use Authorization (EUA). This EUA will remain in effect (meaning this test can be used) for the duration of the COVID-19 declaration under Section 564(b)(1) of the Act, 21 U.S.C. section 360bbb-3(b)(1), unless the authorization is terminated or revoked.  Performed at [facility], [address] [place], [place] [address]      Coagulation Studies: Recent Labs    11/29/21 2329  LABPROT 16.3*  INR 1.3*     Urinalysis: No results for input(s): "COLORURINE", "LABSPEC", "PHURINE", "GLUCOSEU", "HGBUR", "BILIRUBINUR", "KETONESUR", "PROTEINUR", "UROBILINOGEN", "NITRITE", "LEUKOCYTESUR" in the last 72 hours.  Invalid input(s): "APPERANCEUR"    Imaging: No results found.   Medications:    furosemide (LASIX) 200 mg in dextrose 5 % 100 mL (2 mg/mL) infusion 6 mg/hr (12/01/21 0221)    amiodarone  200 mg Oral Daily   docusate sodium  100 mg Oral BID   enoxaparin (LOVENOX) injection  30 mg Subcutaneous Q24H   pantoprazole  40 mg Oral BID   nitroGLYCERIN  Assessment/ Plan:  Ms. LILLER YOHN is a 81 y.o.  female with past medical history including diastolic CHF, anemia, CAD, paroxysmal atrial fibrillation without anticoagulation.  GI bleed.  Chronic kidney disease stage IV.  Patient reports to ED with complaints of lower extremity edema.  Patient has been admitted for Acute CHF (congestive heart failure) ([place]) [I50.9] Acute decompensated heart failure ([place]) [I50.9]   Acute Kidney Injury on chronic kidney disease stage IV with baseline creatinine 1.75 and GFR of 29 on 08/24/21.  Acute kidney injury secondary to volume overload and diuresis.  No IV contrast exposure.  No acute need for dialysis.  Renal function remains stable.  Urine output 1.8 L recorded in previous 24 hours.  We will continue to monitor renal function with furosemide drip.   Lab Results  Component Value Date   CREATININE 2.12 (H) 12/01/2021   CREATININE 2.13 (H) 11/30/2021   CREATININE 2.13 (H) 11/29/2021    Intake/Output Summary (Last 24 hours) at 12/01/2021 1314 Last data filed at 11/30/2021 2042 Gross per 24 hour  Intake 240 ml  Output 1050 ml  Net -810 ml    2. Chronic diastolic heart failure. ECHO from 08/10/21 shows EF 60-65% with a Grade II diastolic dysfunction with a mildly mitral valve regurgitation.  Fluid overload  managed well with furosemide drip.  We will continue for 1 to 2 days and determine outpatient diuretic therapy.  3. Anemia of chronic kidney disease Lab Results  Component Value Date   HGB 8.2 (L) 12/01/2021    Hemoglobin below desired target.  We will continue to monitor.   LOS: [place] 8/3/20231:14 PM

## 2021-12-01 NOTE — Progress Notes (Addendum)
Physical Therapy Treatment Patient Details Name: April [name] MRN: [id number] DOB: 1941-04-17 Today's Date: 12/01/2021   History of Present Illness Pt is an 81 y.o. female presenting to hospital 7/26 with increasing peripheral edema.  Pt admitted with acute on chronic diastolic CHF.  PMH includes h/o chronic diastolic CHF, anemia, sleep apnea, CAD s/p tenting, h/o paroxysmal a-fib (not on anticoagulation secondary to recurrent GI bleed), CKD, spasmodic dysphonia, STEMI, urine incontinence, B breast surgery.    PT Comments    Pt presents to PT seated in recliner and agreeable to participate in therapy services. Pt requires SBA for all FM. Able to amb >300 ft in hallway utilizing step through gait pattern w RW, SBA, and no LOB. Pt requires min verbal cueing for safety and proper use of RW. Also able to perform a few seated exercises with verbal/visual cues for BLE.  Pt will benefit from HHPT services upon discharge to safely address deficits listed in patient problem list for eventual return to PLOF.     Recommendations for follow up therapy are one component of a multi-disciplinary discharge planning process, led by the attending physician.  Recommendations may be updated based on patient status, additional functional criteria and insurance authorization.  Follow Up Recommendations  Home health PT     Assistance Recommended at Discharge Set up Supervision/Assistance  Patient can return home with the following A little help with bathing/dressing/bathroom;Assistance with cooking/housework;Assist for transportation;Help with stairs or ramp for entrance   Equipment Recommendations  Rolling walker (2 wheels);BSC/3in1    Recommendations for Other Services       Precautions / Restrictions Precautions Precautions: Fall Restrictions Weight Bearing Restrictions: No     Mobility  Bed Mobility               General bed mobility comments: up in recliner at start/end of session     Transfers   Equipment used: Rolling walker (2 wheels) Transfers: Sit to/from Stand Sit to Stand: Supervision                Ambulation/Gait Ambulation/Gait assistance: Supervision Gait Distance (Feet):  (>300 ft) Assistive device: Rolling walker (2 wheels) Gait Pattern/deviations: Step-through pattern Gait velocity: decreased     General Gait Details: Pt amb 1 lap around nursing station w SBA and RW w no LOB. Required min verbal cueing for proper RW control for safety and fall prevention.   Stairs             Wheelchair Mobility    Modified Rankin (Stroke Patients Only)       Balance Overall balance assessment: Needs assistance Sitting-balance support: No upper extremity supported, Feet supported Sitting balance-Leahy Scale: Good     Standing balance support: Bilateral upper extremity supported, During functional activity Standing balance-Leahy Scale: Good                              Cognition Arousal/Alertness: Awake/alert Behavior During Therapy: WFL for tasks assessed/performed Overall Cognitive Status: Within Functional Limits for tasks assessed                                          Exercises Other Exercises Other Exercises: seated marches 2x15 Other Exercises: LAQs 2x15 Other Exercises: seated calf raises 1x15    General Comments        Pertinent Vitals/Pain Pain  Assessment Pain Assessment: No/denies pain    Home Living                          Prior Function            PT Goals (current goals can now be found in the care plan section) Acute Rehab PT Goals Patient Stated Goal: to go home PT Goal Formulation: With patient Time For Goal Achievement: 12/14/21 Potential to Achieve Goals: Good Progress towards PT goals: Progressing toward goals    Frequency    Min 2X/week      PT Plan Current plan remains appropriate    Co-evaluation              AM-PAC PT "6 Clicks"  Mobility   Outcome Measure  Help needed turning from your back to your side while in a flat bed without using bedrails?: None Help needed moving from lying on your back to sitting on the side of a flat bed without using bedrails?: None Help needed moving to and from a bed to a chair (including a wheelchair)?: None Help needed standing up from a chair using your arms (e.g., wheelchair or bedside chair)?: None Help needed to walk in hospital room?: A Little Help needed climbing 3-5 steps with a railing? : A Little 6 Click Score: 22    End of Session Equipment Utilized During Treatment: Gait belt Activity Tolerance: Patient tolerated treatment well Patient left: in chair;with call bell/phone within reach;with chair alarm set;with family/visitor present   PT Visit Diagnosis: Unsteadiness on feet (R26.81);Muscle weakness (generalized) (M62.81);Other abnormalities of gait and mobility (R26.89)     Time: 8921-1941 PT Time Calculation (min) (ACUTE ONLY): 15 min  Charges:  $Therapeutic Exercise: 8-22 mins                    Glenice Laine MPH, SPT 12/01/21, 3:12 PM

## 2021-12-01 NOTE — Progress Notes (Signed)
Occupational Therapy Treatment Patient Details Name: April [name] MRN: [id number] DOB: [birth year] Today's Date: 12/01/2021   History of present illness Pt is an 81 y.o. female presenting to hospital 7/26 with increasing peripheral edema.  Pt admitted with acute on chronic diastolic CHF.  PMH includes h/o chronic diastolic CHF, anemia, sleep apnea, CAD s/p tenting, h/o paroxysmal a-fib (not on anticoagulation secondary to recurrent GI bleed), CKD, spasmodic dysphonia, STEMI, urine incontinence, B breast surgery.   OT comments  April [name] made good effort during today's OT session. She was able to engage in multiple sit<>stand, perform dressing, grooming, transfers, ambulation, and self-feeding tasks, all with good safety awareness, no LOB, little to no physical assistance required. Provided educ re: importance of regular OOB mobility, slow but steady approach to increasing daily physical activity routine at home. Encouraged pt to participate in classes offered at [facility], to walk to dining hall rather than have meals delivered to her room, to slowly increase number of steps walked each day, etc. Pt verbalizes understanding and is able to come up with a strategy for addressing. DC with HHOT remains appropriate plan.   Recommendations for follow up therapy are one component of a multi-disciplinary discharge planning process, led by the attending physician.  Recommendations may be updated based on patient status, additional functional criteria and insurance authorization.    Follow Up Recommendations  Home health OT    Assistance Recommended at Discharge Intermittent Supervision/Assistance  Patient can return home with the following  Assistance with cooking/housework;A little help with bathing/dressing/bathroom   Equipment Recommendations  None recommended by OT    Recommendations for Other Services      Precautions / Restrictions Precautions Precautions: Fall Restrictions Weight Bearing  Restrictions: No       Mobility Bed Mobility Overal bed mobility: Modified Independent             General bed mobility comments: increased time/effort    Transfers Overall transfer level: Needs assistance Equipment used: Rolling walker (2 wheels) Transfers: Sit to/from Stand, Bed to chair/wheelchair/BSC Sit to Stand: Supervision     Step pivot transfers: Supervision     General transfer comment: multiple sit<>stand, from various surfaces; good technique and safety awareness     Balance Overall balance assessment: Needs assistance Sitting-balance support: No upper extremity supported, Feet supported Sitting balance-Leahy Scale: Normal     Standing balance support: Bilateral upper extremity supported, During functional activity Standing balance-Leahy Scale: Good Standing balance comment: good standing balance, reaching beyond BOS, w/ RW                           ADL either performed or assessed with clinical judgement   ADL Overall ADL's : Needs assistance/impaired Eating/Feeding: Independent;Set up   Grooming: Standing;Supervision/safety               Lower Body Dressing: Minimal assistance Lower Body Dressing Details (indicate cue type and reason): Min A for donning footwear             Functional mobility during ADLs: Supervision/safety;Rolling walker (2 wheels)      Extremity/Trunk Assessment Upper Extremity Assessment Upper Extremity Assessment: Generalized weakness   Lower Extremity Assessment Lower Extremity Assessment: Generalized weakness   Cervical / Trunk Assessment Cervical / Trunk Exceptions: forward head/shoulders    Vision       Perception     Praxis      Cognition Arousal/Alertness: Awake/alert Behavior During Therapy: WFL for tasks  assessed/performed Overall Cognitive Status: Within Functional Limits for tasks assessed                                          Exercises Other  Exercises Other Exercises: UE and LE therex in sitting and standing. Educ re: importance of OOB movement, increased physical activity    Shoulder Instructions       General Comments      Pertinent Vitals/ Pain       Pain Assessment Pain Assessment: No/denies pain  Home Living                                          Prior Functioning/Environment              Frequency  Min 2X/week        Progress Toward Goals  OT Goals(current goals can now be found in the care plan section)  Progress towards OT goals: Progressing toward goals  Acute Rehab OT Goals OT Goal Formulation: With patient Time For Goal Achievement: 12/10/21 Potential to Achieve Goals: Good  Plan Discharge plan remains appropriate;Frequency remains appropriate    Co-evaluation                 AM-PAC OT "6 Clicks" Daily Activity     Outcome Measure   Help from another person eating meals?: None Help from another person taking care of personal grooming?: None Help from another person toileting, which includes using toliet, bedpan, or urinal?: A Little Help from another person bathing (including washing, rinsing, drying)?: A Little Help from another person to put on and taking off regular upper body clothing?: None Help from another person to put on and taking off regular lower body clothing?: A Little 6 Click Score: 21    End of Session Equipment Utilized During Treatment: Rolling walker (2 wheels)  OT Visit Diagnosis: Muscle weakness (generalized) (M62.81)   Activity Tolerance Patient tolerated treatment well   Patient Left in chair;with call bell/phone within reach;with chair alarm set   Nurse Communication          Time: 5427-0623 OT Time Calculation (min): 19 min  Charges: OT General Charges $OT Visit: 1 Visit OT Treatments $Self Care/Home Management : 8-22 mins  Josiah Lobo, PhD, MS, OTR/L 12/01/21, 2:05 PM

## 2021-12-02 DIAGNOSIS — I5033 Acute on chronic diastolic (congestive) heart failure: Secondary | ICD-10-CM | POA: Diagnosis not present

## 2021-12-02 LAB — BASIC METABOLIC PANEL
Anion gap: 7 (ref 5–15)
BUN: 40 mg/dL — ABNORMAL HIGH (ref 8–23)
CO2: 26 mmol/L (ref 22–32)
Calcium: 8.3 mg/dL — ABNORMAL LOW (ref 8.9–10.3)
Chloride: 103 mmol/L (ref 98–111)
Creatinine, Ser: 2.05 mg/dL — ABNORMAL HIGH (ref 0.44–1.00)
GFR, Estimated: 24 mL/min — ABNORMAL LOW (ref >60)
Glucose, Bld: 81 mg/dL (ref 70–99)
Potassium: 3.3 mmol/L — ABNORMAL LOW (ref 3.5–5.1)
Sodium: 136 mmol/L (ref 135–145)

## 2021-12-02 LAB — CBC
HCT: 23.5 % — ABNORMAL LOW (ref 36.0–46.0)
Hemoglobin: 7.9 g/dL — ABNORMAL LOW (ref 12.0–15.0)
MCH: 29.9 pg (ref 26.0–34.0)
MCHC: 33.6 g/dL (ref 30.0–36.0)
MCV: 89 fL (ref 80.0–100.0)
Platelets: 196 10*3/uL (ref 150–400)
RBC: 2.64 MIL/uL — ABNORMAL LOW (ref 3.87–5.11)
RDW: 20.7 % — ABNORMAL HIGH (ref 11.5–15.5)
WBC: 8.6 10*3/uL (ref 4.0–10.5)
nRBC: 0 % (ref 0.0–0.2)

## 2021-12-02 LAB — MAGNESIUM: Magnesium: 2.3 mg/dL (ref 1.7–2.4)

## 2021-12-02 MED ORDER — POTASSIUM CHLORIDE CRYS ER 20 MEQ PO TBCR
40.0000 meq | EXTENDED_RELEASE_TABLET | Freq: Once | ORAL | Status: AC
  Administered 2021-12-02: 40 meq via ORAL
  Filled 2021-12-02: qty 2

## 2021-12-02 MED ORDER — DARBEPOETIN ALFA 100 MCG/0.5ML IJ SOSY
100.0000 ug | PREFILLED_SYRINGE | INTRAMUSCULAR | Status: DC
  Administered 2021-12-02 – 2021-12-16 (×3): 100 ug via SUBCUTANEOUS
  Filled 2021-12-02 (×3): qty 0.5

## 2021-12-02 MED ORDER — METOLAZONE 2.5 MG PO TABS
2.5000 mg | ORAL_TABLET | Freq: Once | ORAL | Status: AC
Start: 2021-12-02 — End: 2021-12-02
  Administered 2021-12-02: 2.5 mg via ORAL
  Filled 2021-12-02: qty 1

## 2021-12-02 MED ORDER — DICLOFENAC SODIUM 1 % EX GEL
2.0000 g | Freq: Three times a day (TID) | CUTANEOUS | Status: DC | PRN
  Administered 2021-12-02 – 2021-12-12 (×3): 2 g via TOPICAL
  Filled 2021-12-02: qty 100

## 2021-12-02 MED ORDER — LIDOCAINE 5 % EX PTCH
2.0000 | MEDICATED_PATCH | CUTANEOUS | Status: DC
  Administered 2021-12-02 – 2021-12-11 (×8): 2 via TRANSDERMAL
  Filled 2021-12-02 (×18): qty 2

## 2021-12-02 NOTE — Progress Notes (Signed)
Occupational Therapy Treatment Patient Details Name: April [name] MRN: [id number] DOB: 05/14/1940 Today's Date: 12/02/2021   History of present illness Pt is an 81 y.o. female presenting to hospital 7/26 with increasing peripheral edema.  Pt admitted with acute on chronic diastolic CHF.  PMH includes h/o chronic diastolic CHF, anemia, sleep apnea, CAD s/p tenting, h/o paroxysmal a-fib (not on anticoagulation secondary to recurrent GI bleed), CKD, spasmodic dysphonia, STEMI, urine incontinence, B breast surgery.   OT comments  Pt seen for OT tx this date. Pt up in [place] declining further mobility at this time after recent bout of mobility with another therapist. She endorses not feeling really well today and shares that she vomited this morning. Pt agreeable to chair level session. Pt further instructed in energy conservation strategies to support her ADL and IADL participation while minimizing risk of over exertion and falls. Pt verbalized understanding. She continues to benefit from skilled OT services to maximize return to PLOF. Continue to recommend [place].    Recommendations for follow up therapy are one component of a multi-disciplinary discharge planning process, led by the attending physician.  Recommendations may be updated based on patient status, additional functional criteria and insurance authorization.    Follow Up Recommendations  Home health OT    Assistance Recommended at Discharge Intermittent Supervision/Assistance  Patient can return home with the following  Assistance with cooking/housework;A little help with bathing/dressing/bathroom   Equipment Recommendations  None recommended by OT    Recommendations for Other Services      Precautions / Restrictions Precautions Precautions: Fall Restrictions Weight Bearing Restrictions: No       Mobility Bed Mobility               General bed mobility comments: up in recliner at start/end of session     Transfers                   General transfer comment: pt declined 2/2 fatigue after recent mobility     Balance                                           ADL either performed or assessed with clinical judgement   ADL                                              Extremity/Trunk Assessment              Vision       Perception     Praxis      Cognition Arousal/Alertness: Awake/alert Behavior During Therapy: WFL for tasks assessed/performed Overall Cognitive Status: Within Functional Limits for tasks assessed                                          Exercises Other Exercises Other Exercises: Pt further instructed in energy conservation strategies to support her ADL and IADL participation while minimizing risk of over exertion and falls.    Shoulder Instructions       General Comments      Pertinent Vitals/ Pain       Pain Assessment Pain Assessment: 0-10 Pain Score: 3  Pain Location: low back Pain Descriptors / Indicators: Aching Pain Intervention(s): Limited activity within patient's tolerance, Monitored during session, Patient requesting pain meds-RN notified  Home Living                                          Prior Functioning/Environment              Frequency  Min 2X/week        Progress Toward Goals  OT Goals(current goals can now be found in the care plan section)  Progress towards OT goals: Progressing toward goals  Acute Rehab OT Goals Patient Stated Goal: pt would like to return home and be as independent as possible OT Goal Formulation: With patient Time For Goal Achievement: 12/10/21 Potential to Achieve Goals: Good  Plan Discharge plan remains appropriate;Frequency remains appropriate    Co-evaluation                 AM-PAC OT "6 Clicks" Daily Activity     Outcome Measure   Help from another person eating meals?: None Help from  another person taking care of personal grooming?: None Help from another person toileting, which includes using toliet, bedpan, or urinal?: A Little Help from another person bathing (including washing, rinsing, drying)?: A Little Help from another person to put on and taking off regular upper body clothing?: None Help from another person to put on and taking off regular lower body clothing?: A Little 6 Click Score: 21    End of Session    OT Visit Diagnosis: Muscle weakness (generalized) (M62.81)   Activity Tolerance Patient limited by fatigue   Patient Left in chair;with call bell/phone within reach;with chair alarm set   Nurse Communication Patient requests pain meds        Time: 5732-2025 OT Time Calculation (min): 17 min  Charges: OT General Charges $OT Visit: 1 Visit OT Treatments $Self Care/Home Management : 8-22 mins  Ardeth Perfect., MPH, MS, OTR/L ascom [phone] 12/02/21, 4:49 PM

## 2021-12-02 NOTE — Progress Notes (Signed)
Mobility Specialist - Progress Note    12/02/21 1136  Mobility  Activity Ambulated with assistance in hallway  Level of Assistance Contact guard assist, steadying assist  Assistive Device Front wheel walker  Distance Ambulated (ft) 100 ft  Activity Response Tolerated well  $Mobility charge 1 Mobility    Pt sitting in recliner on RA upon arrival. Pt STS and ambulates in hallway CGA. Pt returns to recliner with needs in reach.   April [name]  Mobility Specialist  12/02/21 11:38 AM

## 2021-12-02 NOTE — Progress Notes (Addendum)
Physical Therapy Treatment Patient Details Name: April [name] MRN: [id number] DOB: 1940-09-17 Today's Date: 12/02/2021   History of Present Illness Pt is an 81 y.o. female presenting to hospital 7/26 with increasing peripheral edema.  Pt admitted with acute on chronic diastolic CHF.  PMH includes h/o chronic diastolic CHF, anemia, sleep apnea, CAD s/p tenting, h/o paroxysmal a-fib (not on anticoagulation secondary to recurrent GI bleed), CKD, spasmodic dysphonia, STEMI, urine incontinence, B breast surgery.    PT Comments    Pt seen for PT tx with pt eager to participate & get better despite feeling nauseous with emesis episode earlier today. Pt is able to complete STS from recliner with supervision but requires CGA for STS from low toilet in bathroom with cuing to use grab bar. Pt with 2 mild LOB with min assist to correct in bathroom but pt with continent void & manages clothing over hips for toileting, although PT assists with threading new mesh underwear over BLE. Pt ambulates 1 lap around nurses station with RW & supervision with improved gait pattern & posture in clear, open areas. HR 70-91 bpm during session. Pt does note fatigue at end of session but hopeful she's getting stronger.    Recommendations for follow up therapy are one component of a multi-disciplinary discharge planning process, led by the attending physician.  Recommendations may be updated based on patient status, additional functional criteria and insurance authorization.  Follow Up Recommendations  Home health PT     Assistance Recommended at Discharge Set up Supervision/Assistance  Patient can return home with the following A little help with bathing/dressing/bathroom;Assistance with cooking/housework;Assist for transportation;Help with stairs or ramp for entrance   Equipment Recommendations  Rolling walker (2 wheels);BSC/3in1    Recommendations for Other Services       Precautions / Restrictions  Precautions Precautions: Fall Restrictions Weight Bearing Restrictions: No     Mobility  Bed Mobility                    Transfers       Sit to Stand: Supervision   Step pivot transfers: Supervision       General transfer comment: STS from recliner, CGA STS from low toilet with use of grab bar    Ambulation/Gait Ambulation/Gait assistance: Supervision Gait Distance (Feet): 20 Feet (+ 175 ft) Assistive device: Rolling walker (2 wheels) Gait Pattern/deviations: Step-through pattern, Decreased step length - right, Decreased step length - left, Decreased dorsiflexion - right, Decreased dorsiflexion - left, Decreased stride length Gait velocity: decreased     General Gait Details: Pt ambulates chair>bathroom with RW & supervision & ambulates 1 lap around nurses station with RW & supervision. When in room & at end of gait in hallway pt with more shuffled gait with decreased foor clearance but has more upright posture & foot clearance when ambulating in clear hallway. PT provides education on importance of foot clearance at all times to reduce fall risk.   Stairs             Wheelchair Mobility    Modified Rankin (Stroke Patients Only)       Balance Overall balance assessment: Needs assistance Sitting-balance support: No upper extremity supported, Feet supported Sitting balance-Leahy Scale: Good     Standing balance support: Bilateral upper extremity supported, During functional activity Standing balance-Leahy Scale: Good  Cognition Arousal/Alertness: Awake/alert Behavior During Therapy: WFL for tasks assessed/performed Overall Cognitive Status: Within Functional Limits for tasks assessed                                 General Comments: pleasant lady        Exercises      General Comments        Pertinent Vitals/Pain Pain Assessment Pain Assessment: No/denies pain    Home Living                           Prior Function            PT Goals (current goals can now be found in the care plan section) Acute Rehab PT Goals Patient Stated Goal: to go home PT Goal Formulation: With patient Time For Goal Achievement: 12/14/21 Potential to Achieve Goals: Good Progress towards PT goals: Progressing toward goals    Frequency    Min 2X/week      PT Plan Current plan remains appropriate    Co-evaluation              AM-PAC PT "6 Clicks" Mobility   Outcome Measure  Help needed turning from your back to your side while in a flat bed without using bedrails?: None Help needed moving from lying on your back to sitting on the side of a flat bed without using bedrails?: None Help needed moving to and from a bed to a chair (including a wheelchair)?: None Help needed standing up from a chair using your arms (e.g., wheelchair or bedside chair)?: None Help needed to walk in hospital room?: A Little Help needed climbing 3-5 steps with a railing? : A Little 6 Click Score: 22    End of Session   Activity Tolerance: Patient tolerated treatment well Patient left: in chair;with chair alarm set;with call bell/phone within reach;with family/visitor present   PT Visit Diagnosis: Unsteadiness on feet (R26.81);Muscle weakness (generalized) (M62.81);Other abnormalities of gait and mobility (R26.89)     Time: 1400-1419 PT Time Calculation (min) (ACUTE ONLY): 19 min  Charges:  $Therapeutic Activity: 8-22 mins                     Lavone Nian, PT, DPT 12/02/21, 2:25 PM    Waunita Schooner 12/02/2021, 2:23 PM

## 2021-12-02 NOTE — Progress Notes (Signed)
[facility]  ROUNDING NOTE   Subjective:   April [name] is a 81 year old female with past medical history including diastolic CHF, anemia, CAD, paroxysmal atrial fibrillation without anticoagulation.  GI bleed.  Chronic kidney disease stage IV.  Patient reports to ED with complaints of lower extremity edema.  Patient has been admitted for Acute CHF (congestive heart failure) ([place]) [I50.9] Acute decompensated heart failure ([place]) [I50.9]  Patient is known to our practice and is followed outpatient by Dr. Candiss Norse.    Patient seen sitting up in chair States she continues to tolerate lower extremity and feet exercises States edema has improved however remains Appetite slowly improving  Creatinine stable 2.05 Daily weight 79.3 kg down from 82 kg.   Objective:  Vital signs in last 24 hours:  Temp:  [97.8 F (36.6 C)-98.4 F (36.9 C)] 97.8 F (36.6 C) (08/04 1129) Pulse Rate:  [66-74] 74 (08/04 1129) Resp:  [16-19] 18 (08/04 1129) BP: (101-141)/(33-87) 141/57 (08/04 1129) SpO2:  [96 %-100 %] 99 % (08/04 1129) Weight:  [79.3 kg] 79.3 kg (08/04 0512)  Weight change: -5.611 kg Filed Weights   12/01/21 0406 12/01/21 0500 12/02/21 0512  Weight: 84.9 kg 82 kg 79.3 kg    Intake/Output: I/O last 3 completed shifts: In: -  Out: 950 [Urine:950]   Intake/Output this shift:  Total I/O In: 406.5 [P.O.:240; I.V.:166.5] Out: 200 [Urine:200]  Physical Exam: General: NAD  Head: Normocephalic, atraumatic. Moist oral mucosal membranes  Eyes: Anicteric  Lungs:  Clear to auscultation, normal effort, room air  Heart: Regular rate and rhythm  Abdomen:  Soft, nontender, nondistended  Extremities: 2+ peripheral edema.  Neurologic: Nonfocal, moving all four extremities  Skin: No lesions, dry  Access: None    Basic Metabolic Panel: Recent Labs  Lab 11/28/21 0842 11/29/21 0344 11/29/21 2329 11/30/21 0501 12/01/21 0449 12/02/21 0549  NA  --  131* 132* 135 133* 136  K   --  3.3* 4.5 3.3* 3.4* 3.3*  CL  --  100 97* 99 100 103  CO2  --  '25 26 27 24 26  '$ GLUCOSE  --  116* 136* 129* 97 81  BUN  --  39* 41* 41* 42* 40*  CREATININE  --  2.01* 2.13* 2.13* 2.12* 2.05*  CALCIUM  --  8.2* 8.5* 8.3* 8.3* 8.3*  MG 2.2 2.1  --  2.2 2.3 2.3     Liver Function Tests: Recent Labs  Lab 11/29/21 2329  AST 76*  ALT 32  ALKPHOS 99  BILITOT 1.7*  PROT 6.2*  ALBUMIN 2.4*    No results for input(s): "LIPASE", "AMYLASE" in the last 168 hours. No results for input(s): "AMMONIA" in the last 168 hours.  CBC: Recent Labs  Lab 11/28/21 1141 11/29/21 2329 12/01/21 0449 12/02/21 0549  WBC 8.8 10.7* 8.8 8.6  NEUTROABS 6.3  --   --   --   HGB 9.5* 9.0* 8.2* 7.9*  HCT 29.7* 26.7* 23.3* 23.5*  MCV 93.7 90.2 88.6 89.0  PLT 184 207 179 196     Cardiac Enzymes: No results for input(s): "CKTOTAL", "CKMB", "CKMBINDEX", "TROPONINI" in the last 168 hours.  BNP: Invalid input(s): "POCBNP"  CBG: Recent Labs  Lab 11/30/21 0727 11/30/21 1132 11/30/21 1631  GLUCAP 102* 116* 134*     Microbiology: Results for orders placed or performed during the hospital encounter of 07/07/21  Resp Panel by RT-PCR (Flu A&B, Covid) Nasopharyngeal Swab     Status: None   Collection Time: 07/07/21  8:01 PM   Specimen: Nasopharyngeal Swab; Nasopharyngeal(NP) swabs in vial transport medium  Result Value Ref Range Status   SARS Coronavirus 2 by RT PCR NEGATIVE NEGATIVE Final    Comment: (NOTE) SARS-CoV-2 target nucleic acids are NOT DETECTED.  The SARS-CoV-2 RNA is generally detectable in upper respiratory specimens during the acute phase of infection. The lowest concentration of SARS-CoV-2 viral copies this assay can detect is 138 copies/mL. A negative result does not preclude SARS-Cov-2 infection and should not be used as the sole basis for treatment or other patient management decisions. A negative result may occur with  improper specimen collection/handling, submission of  specimen other than nasopharyngeal swab, presence of viral mutation(s) within the areas targeted by this assay, and inadequate number of viral copies(<138 copies/mL). A negative result must be combined with clinical observations, patient history, and epidemiological information. The expected result is Negative.  Fact Sheet for Patients:  EntrepreneurPulse.com.au  Fact Sheet for Healthcare Providers:  IncredibleEmployment.be  This test is no t yet approved or cleared by the [place] FDA and  has been authorized for detection and/or diagnosis of SARS-CoV-2 by FDA under an Emergency Use Authorization (EUA). This EUA will remain  in effect (meaning this test can be used) for the duration of the COVID-19 declaration under Section 564(b)(1) of the Act, 21 U.S.C.section 360bbb-3(b)(1), unless the authorization is terminated  or revoked sooner.       Influenza A by PCR NEGATIVE NEGATIVE Final   Influenza B by PCR NEGATIVE NEGATIVE Final    Comment: (NOTE) The Xpert Xpress SARS-CoV-2/FLU/RSV plus assay is intended as an aid in the diagnosis of influenza from Nasopharyngeal swab specimens and should not be used as a sole basis for treatment. Nasal washings and aspirates are unacceptable for Xpert Xpress SARS-CoV-2/FLU/RSV testing.  Fact Sheet for Patients: EntrepreneurPulse.com.au  Fact Sheet for Healthcare Providers: IncredibleEmployment.be  This test is not yet approved or cleared by the [place] FDA and has been authorized for detection and/or diagnosis of SARS-CoV-2 by FDA under an Emergency Use Authorization (EUA). This EUA will remain in effect (meaning this test can be used) for the duration of the COVID-19 declaration under Section 564(b)(1) of the Act, 21 U.S.C. section 360bbb-3(b)(1), unless the authorization is terminated or revoked.  Performed at [facility], [address] [place], [place] [id number]     Coagulation Studies: Recent Labs    11/29/21 2329  LABPROT 16.3*  INR 1.3*     Urinalysis: No results for input(s): "COLORURINE", "LABSPEC", "PHURINE", "GLUCOSEU", "HGBUR", "BILIRUBINUR", "KETONESUR", "PROTEINUR", "UROBILINOGEN", "NITRITE", "LEUKOCYTESUR" in the last 72 hours.  Invalid input(s): "APPERANCEUR"    Imaging: No results found.   Medications:    furosemide (LASIX) 200 mg in dextrose 5 % 100 mL (2 mg/mL) infusion 6 mg/hr (12/02/21 1334)    amiodarone  200 mg Oral Daily   docusate sodium  100 mg Oral BID   enoxaparin (LOVENOX) injection  30 mg Subcutaneous Q24H   pantoprazole  40 mg Oral BID   acetaminophen, nitroGLYCERIN  Assessment/ Plan:  Ms. CAROLIN QUANG is a 81 y.o.  female with past medical history including diastolic CHF, anemia, CAD, paroxysmal atrial fibrillation without anticoagulation.  GI bleed.  Chronic kidney disease stage IV.  Patient reports to ED with complaints of lower extremity edema.  Patient has been admitted for Acute CHF (congestive heart failure) ([place]) [I50.9] Acute decompensated heart failure ([place]) [I50.9]   Acute Kidney Injury on chronic kidney disease stage IV with  baseline creatinine 1.75 and GFR of 29 on 08/24/21.  Acute kidney injury secondary to volume overload and diuresis.  No IV contrast exposure.  No acute need for dialysis.  Renal function continues to slowly improve on furosemide drip.  Continue to avoid nephrotoxic agents and therapies.   Lab Results  Component Value Date   CREATININE 2.05 (H) 12/02/2021   CREATININE 2.12 (H) 12/01/2021   CREATININE 2.13 (H) 11/30/2021    Intake/Output Summary (Last 24 hours) at 12/02/2021 1421 Last data filed at 12/02/2021 1411 Gross per 24 hour  Intake 406.52 ml  Output 750 ml  Net -343.48 ml    2. Chronic diastolic heart failure. ECHO from 08/10/21 shows EF 60-65% with a Grade II diastolic dysfunction with a mildly mitral valve regurgitation.     Desired results will be achieved with furosemide drip.  We will also add 1 dose of metolazone 2.5 mg today.  May consider discontinuing drip tomorrow.  Continue daily standing weights in addition to fluid output recording.  3. Anemia of chronic kidney disease Lab Results  Component Value Date   HGB 7.9 (L) 12/02/2021    Hemoglobin not at goal.  We will continue to monitor.   LOS: 8 April [name] 8/4/20232:21 PM

## 2021-12-02 NOTE — Plan of Care (Signed)

## 2021-12-02 NOTE — Progress Notes (Signed)
PROGRESS NOTE    April [name]  AYT:[id number] DOB: March 28, 1941 DOA: 11/23/2021 PCP: Einar Pheasant, MD    Brief Narrative:   81 y.o. female with with history of chronic diastolic CHF last EF measured in April 2023 was 80 to 65% with grade 2 diastolic dysfunction, chronic and disease stage IV, anemia, sleep apnea, CAD status post tenting, history of paroxysmal atrial fibrillation not on anticoagulation secondary to recurrent GI bleed was referred to the ER by patient's cardiologist after patient was found to have increasing peripheral edema.  Patient states over the last 1 week patient has gained at least 4 pounds and has been having increasing peripheral edema.  Patient had gone to her nephrologist 2 days ago and her torsemide was increased from 20 twice daily and it is now 40 twice daily which patient has been taking for last 2 days.  Volume removal has been challenging.  Started on Lasix gtt. on 7/31.  UOP improving.  Nephrology on consult.   Assessment & Plan:   Principal Problem:   Acute on chronic diastolic CHF (congestive heart failure) (HCC) Active Problems:   CAD S/P PCI pRCA Promus DES 3.5 x 16 (4.1 mm), ostRPDA Promus DES 2.5 x 12 (2.7 mm)   Paroxysmal atrial fibrillation (HCC)   Essential hypertension, benign   OSA on CPAP   Acute CHF (congestive heart failure) (HCC)   Acute decompensated heart failure (HCC)  Acute on chronic diastolic congestive heart failure EF in April 2023 35-57%, grade 2 diastolic dysfunction 4 pound weight gain in the last 1 week Torsemide dosing recently increased Was sent in by outpatient cardiology UOP not accurately recorded Difficult to mobilize fluid.  Was started on lasix gtt. Plan: --cont lasix gtt per nephrology  Hypokalemia --monitor and replete PRN  Obstructive sleep apnea --cont CPAP nightly  Coronary disease status post PCI No chest pain, no concern for ACS at this time Does not appear to be on any antiplatelet  agents --outpatient cardio f/u  Paroxysmal atrial fibrillation Currently in sinus rhythm No anticoagulation secondary to history of GI bleed --cont amiodarone  Chronic kidney disease stage IV Creatinine at or near baseline --nephrology consulted   DVT prophylaxis: SQ Lovenox Code Status: Full Family Communication:   Disposition Plan: Status is: Inpatient Remains inpatient appropriate because: Acute decompensated heart failure on IV diuretic  Level of care: Telemetry Cardiac  Consultants:  Nephrology  Procedures:  None  Antimicrobials: None   Subjective: Pt had nausea and another episode of vomiting today.  Also walked with PT.   Objective: Vitals:   12/02/21 0512 12/02/21 0749 12/02/21 1129 12/02/21 1601  BP:  (!) 101/47 (!) 141/57 (!) 123/48  Pulse:  66 74 60  Resp:  '16 18 17  '$ Temp:  98.4 F (36.9 C) 97.8 F (36.6 C) 97.7 F (36.5 C)  TempSrc:   Oral Oral  SpO2:  98% 99% 99%  Weight: 79.3 kg     Height:        Intake/Output Summary (Last 24 hours) at 12/02/2021 1849 Last data filed at 12/02/2021 1411 Gross per 24 hour  Intake 406.52 ml  Output 750 ml  Net -343.48 ml   Filed Weights   12/01/21 0406 12/01/21 0500 12/02/21 0512  Weight: 84.9 kg 82 kg 79.3 kg    Examination:  Constitutional: NAD, AAOx3, walking with walker HEENT: conjunctivae and lids normal, EOMI CV: No cyanosis.   RESP: normal respiratory effort, on RA Neuro: II - XII grossly intact.   Psych:  Normal mood and affect.  Appropriate judgement and reason   Data Reviewed: I have personally reviewed following labs and imaging studies  CBC: Recent Labs  Lab 11/28/21 1141 11/29/21 2329 12/01/21 0449 12/02/21 0549  WBC 8.8 10.7* 8.8 8.6  NEUTROABS 6.3  --   --   --   HGB 9.5* 9.0* 8.2* 7.9*  HCT 29.7* 26.7* 23.3* 23.5*  MCV 93.7 90.2 88.6 89.0  PLT 184 207 179 244   Basic Metabolic Panel: Recent Labs  Lab 11/28/21 0842 11/29/21 0344 11/29/21 2329 11/30/21 0501  12/01/21 0449 12/02/21 0549  NA  --  131* 132* 135 133* 136  K  --  3.3* 4.5 3.3* 3.4* 3.3*  CL  --  100 97* 99 100 103  CO2  --  '25 26 27 24 26  '$ GLUCOSE  --  116* 136* 129* 97 81  BUN  --  39* 41* 41* 42* 40*  CREATININE  --  2.01* 2.13* 2.13* 2.12* 2.05*  CALCIUM  --  8.2* 8.5* 8.3* 8.3* 8.3*  MG 2.2 2.1  --  2.2 2.3 2.3   GFR: Estimated Creatinine Clearance: 19.4 mL/min (A) (by C-G formula based on SCr of 2.05 mg/dL (H)). Liver Function Tests: Recent Labs  Lab 11/29/21 2329  AST 76*  ALT 32  ALKPHOS 99  BILITOT 1.7*  PROT 6.2*  ALBUMIN 2.4*    No results for input(s): "LIPASE", "AMYLASE" in the last 168 hours. No results for input(s): "AMMONIA" in the last 168 hours. Coagulation Profile: Recent Labs  Lab 11/29/21 2329  INR 1.3*   Cardiac Enzymes: No results for input(s): "CKTOTAL", "CKMB", "CKMBINDEX", "TROPONINI" in the last 168 hours. BNP (last 3 results) No results for input(s): "PROBNP" in the last 8760 hours. HbA1C: No results for input(s): "HGBA1C" in the last 72 hours. CBG: Recent Labs  Lab 11/30/21 0727 11/30/21 1132 11/30/21 1631  GLUCAP 102* 116* 134*   Lipid Profile: No results for input(s): "CHOL", "HDL", "LDLCALC", "TRIG", "CHOLHDL", "LDLDIRECT" in the last 72 hours. Thyroid Function Tests: No results for input(s): "TSH", "T4TOTAL", "FREET4", "T3FREE", "THYROIDAB" in the last 72 hours.  Anemia Panel: No results for input(s): "VITAMINB12", "FOLATE", "FERRITIN", "TIBC", "IRON", "RETICCTPCT" in the last 72 hours. Sepsis Labs: No results for input(s): "PROCALCITON", "LATICACIDVEN" in the last 168 hours.  No results found for this or any previous visit (from the past 240 hour(s)).       Radiology Studies: No results found.      Scheduled Meds:  amiodarone  200 mg Oral Daily   darbepoetin (ARANESP) injection - NON-DIALYSIS  100 mcg Subcutaneous Q Fri-1800   docusate sodium  100 mg Oral BID   enoxaparin (LOVENOX) injection  30 mg  Subcutaneous Q24H   lidocaine  2 patch Transdermal Q24H   pantoprazole  40 mg Oral BID   Continuous Infusions:  furosemide (LASIX) 200 mg in dextrose 5 % 100 mL (2 mg/mL) infusion 6 mg/hr (12/02/21 1334)     LOS: 8 days    Enzo Bi, MD [facility]   If 7PM-7AM, please contact night-coverage  12/02/2021, 6:49 PM

## 2021-12-03 DIAGNOSIS — I5033 Acute on chronic diastolic (congestive) heart failure: Secondary | ICD-10-CM | POA: Diagnosis not present

## 2021-12-03 LAB — BASIC METABOLIC PANEL
Anion gap: 10 (ref 5–15)
BUN: 42 mg/dL — ABNORMAL HIGH (ref 8–23)
CO2: 26 mmol/L (ref 22–32)
Calcium: 8.4 mg/dL — ABNORMAL LOW (ref 8.9–10.3)
Chloride: 102 mmol/L (ref 98–111)
Creatinine, Ser: 2.06 mg/dL — ABNORMAL HIGH (ref 0.44–1.00)
GFR, Estimated: 23 mL/min — ABNORMAL LOW (ref >60)
Glucose, Bld: 93 mg/dL (ref 70–99)
Potassium: 2.9 mmol/L — ABNORMAL LOW (ref 3.5–5.1)
Sodium: 138 mmol/L (ref 135–145)

## 2021-12-03 LAB — CBC
HCT: 23.3 % — ABNORMAL LOW (ref 36.0–46.0)
Hemoglobin: 7.9 g/dL — ABNORMAL LOW (ref 12.0–15.0)
MCH: 30.3 pg (ref 26.0–34.0)
MCHC: 33.9 g/dL (ref 30.0–36.0)
MCV: 89.3 fL (ref 80.0–100.0)
Platelets: 186 10*3/uL (ref 150–400)
RBC: 2.61 MIL/uL — ABNORMAL LOW (ref 3.87–5.11)
RDW: 20.6 % — ABNORMAL HIGH (ref 11.5–15.5)
WBC: 8.8 10*3/uL (ref 4.0–10.5)
nRBC: 0 % (ref 0.0–0.2)

## 2021-12-03 LAB — MAGNESIUM: Magnesium: 2.5 mg/dL — ABNORMAL HIGH (ref 1.7–2.4)

## 2021-12-03 MED ORDER — POTASSIUM CHLORIDE CRYS ER 20 MEQ PO TBCR
40.0000 meq | EXTENDED_RELEASE_TABLET | ORAL | Status: AC
  Administered 2021-12-03 (×2): 40 meq via ORAL
  Filled 2021-12-03 (×2): qty 2

## 2021-12-03 MED ORDER — POTASSIUM CHLORIDE 10 MEQ/100ML IV SOLN
10.0000 meq | INTRAVENOUS | Status: DC
  Filled 2021-12-03 (×4): qty 100

## 2021-12-03 MED ORDER — SODIUM CHLORIDE 0.9 % IV SOLN: INTRAVENOUS | Status: DC | PRN

## 2021-12-03 MED ORDER — ONDANSETRON HCL 4 MG/2ML IJ SOLN
4.0000 mg | Freq: Four times a day (QID) | INTRAMUSCULAR | Status: DC | PRN
Start: 2021-12-03 — End: 2021-12-19
  Administered 2021-12-03 – 2021-12-11 (×4): 4 mg via INTRAVENOUS
  Filled 2021-12-03 (×4): qty 2

## 2021-12-03 NOTE — Plan of Care (Signed)

## 2021-12-03 NOTE — Progress Notes (Addendum)
Pt is complaining of nausea. MD Damita Dunnings made aware. Will continue to monitor.  Update 8902: MD Damita Dunnings placed order. Will continue to monitor.

## 2021-12-03 NOTE — Progress Notes (Signed)
[facility]  ROUNDING NOTE   Subjective:   April [name] is a 81 year old female with past medical history including diastolic CHF, anemia, CAD, paroxysmal atrial fibrillation without anticoagulation.  GI bleed.  Chronic kidney disease stage IV.  Patient reports to ED with complaints of lower extremity edema.  Patient has been admitted for Acute CHF (congestive heart failure) ([place]) [I50.9] Acute decompensated heart failure ([place]) [I50.9]  Patient is known to our practice and is followed outpatient by Dr. Candiss Norse.    Patient seen sitting up in chair States edema has improved  Reports she had an episode of vomiting this morning.   Creatinine stable 2.06 Daily weight decreasing    Objective:  Vital signs in last 24 hours:  Temp:  [97.7 F (36.5 C)-97.9 F (36.6 C)] 97.9 F (36.6 C) (08/05 0807) Pulse Rate:  [60-75] 66 (08/05 0807) Resp:  [17-19] 17 (08/05 0807) BP: (114-148)/(48-61) 114/61 (08/05 0807) SpO2:  [96 %-100 %] 100 % (08/05 0807) Weight:  [81.6 kg] 81.6 kg (08/05 0431)  Weight change: 2.268 kg Filed Weights   12/01/21 0500 12/02/21 0512 12/03/21 0431  Weight: 82 kg 79.3 kg 81.6 kg    Intake/Output: I/O last 3 completed shifts: In: 697.1 [P.O.:480; I.V.:217.1] Out: 950 [Urine:950]   Intake/Output this shift:  Total I/O In: -  Out: 100 [Urine:100]  Physical Exam: General: NAD  Head: Normocephalic, atraumatic. Moist oral mucosal membranes  Eyes: Anicteric  Lungs:  Clear to auscultation, normal effort, room air  Heart: Regular rate and rhythm  Abdomen:  Soft, nontender, nondistended  Extremities: 2+ peripheral edema.  Neurologic: Nonfocal, moving all four extremities  Skin: No lesions, dry  Access: None    Basic Metabolic Panel: Recent Labs  Lab 11/29/21 0344 11/29/21 2329 11/30/21 0501 12/01/21 0449 12/02/21 0549 12/03/21 0546  NA 131* 132* 135 133* 136 138  K 3.3* 4.5 3.3* 3.4* 3.3* 2.9*  CL 100 97* 99 100 103 102  CO2 '25 26 27  24 26 26  '$ GLUCOSE 116* 136* 129* 97 81 93  BUN 39* 41* 41* 42* 40* 42*  CREATININE 2.01* 2.13* 2.13* 2.12* 2.05* 2.06*  CALCIUM 8.2* 8.5* 8.3* 8.3* 8.3* 8.4*  MG 2.1  --  2.2 2.3 2.3 2.5*     Liver Function Tests: Recent Labs  Lab 11/29/21 2329  AST 76*  ALT 32  ALKPHOS 99  BILITOT 1.7*  PROT 6.2*  ALBUMIN 2.4*    No results for input(s): "LIPASE", "AMYLASE" in the last 168 hours. No results for input(s): "AMMONIA" in the last 168 hours.  CBC: Recent Labs  Lab 11/28/21 1141 11/29/21 2329 12/01/21 0449 12/02/21 0549 12/03/21 0546  WBC 8.8 10.7* 8.8 8.6 8.8  NEUTROABS 6.3  --   --   --   --   HGB 9.5* 9.0* 8.2* 7.9* 7.9*  HCT 29.7* 26.7* 23.3* 23.5* 23.3*  MCV 93.7 90.2 88.6 89.0 89.3  PLT 184 207 179 196 186     Cardiac Enzymes: No results for input(s): "CKTOTAL", "CKMB", "CKMBINDEX", "TROPONINI" in the last 168 hours.  BNP: Invalid input(s): "POCBNP"  CBG: Recent Labs  Lab 11/30/21 0727 11/30/21 1132 11/30/21 1631  GLUCAP 102* 116* 134*     Microbiology: Results for orders placed or performed during the hospital encounter of 07/07/21  Resp Panel by RT-PCR (Flu A&B, Covid) Nasopharyngeal Swab     Status: None   Collection Time: 07/07/21  8:01 PM   Specimen: Nasopharyngeal Swab; Nasopharyngeal(NP) swabs in vial transport medium  Result Value Ref Range Status   SARS Coronavirus 2 by RT PCR NEGATIVE NEGATIVE Final    Comment: (NOTE) SARS-CoV-2 target nucleic acids are NOT DETECTED.  The SARS-CoV-2 RNA is generally detectable in upper respiratory specimens during the acute phase of infection. The lowest concentration of SARS-CoV-2 viral copies this assay can detect is 138 copies/mL. A negative result does not preclude SARS-Cov-2 infection and should not be used as the sole basis for treatment or other patient management decisions. A negative result may occur with  improper specimen collection/handling, submission of specimen other than  nasopharyngeal swab, presence of viral mutation(s) within the areas targeted by this assay, and inadequate number of viral copies(<138 copies/mL). A negative result must be combined with clinical observations, patient history, and epidemiological information. The expected result is Negative.  Fact Sheet for Patients:  EntrepreneurPulse.com.au  Fact Sheet for Healthcare Providers:  IncredibleEmployment.be  This test is no t yet approved or cleared by the [place] FDA and  has been authorized for detection and/or diagnosis of SARS-CoV-2 by FDA under an Emergency Use Authorization (EUA). This EUA will remain  in effect (meaning this test can be used) for the duration of the COVID-19 declaration under Section 564(b)(1) of the Act, 21 U.S.C.section 360bbb-3(b)(1), unless the authorization is terminated  or revoked sooner.       Influenza A by PCR NEGATIVE NEGATIVE Final   Influenza B by PCR NEGATIVE NEGATIVE Final    Comment: (NOTE) The Xpert Xpress SARS-CoV-2/FLU/RSV plus assay is intended as an aid in the diagnosis of influenza from Nasopharyngeal swab specimens and should not be used as a sole basis for treatment. Nasal washings and aspirates are unacceptable for Xpert Xpress SARS-CoV-2/FLU/RSV testing.  Fact Sheet for Patients: EntrepreneurPulse.com.au  Fact Sheet for Healthcare Providers: IncredibleEmployment.be  This test is not yet approved or cleared by the [place] FDA and has been authorized for detection and/or diagnosis of SARS-CoV-2 by FDA under an Emergency Use Authorization (EUA). This EUA will remain in effect (meaning this test can be used) for the duration of the COVID-19 declaration under Section 564(b)(1) of the Act, 21 U.S.C. section 360bbb-3(b)(1), unless the authorization is terminated or revoked.  Performed at [facility], [address] [place], [place]  [address]     Coagulation Studies: No results for input(s): "LABPROT", "INR" in the last 72 hours.   Urinalysis: No results for input(s): "COLORURINE", "LABSPEC", "PHURINE", "GLUCOSEU", "HGBUR", "BILIRUBINUR", "KETONESUR", "PROTEINUR", "UROBILINOGEN", "NITRITE", "LEUKOCYTESUR" in the last 72 hours.  Invalid input(s): "APPERANCEUR"    Imaging: No results found.   Medications:    sodium chloride 7 mL/hr at 12/03/21 6283   furosemide (LASIX) 200 mg in dextrose 5 % 100 mL (2 mg/mL) infusion 6 mg/hr (12/02/21 1334)    amiodarone  200 mg Oral Daily   darbepoetin (ARANESP) injection - NON-DIALYSIS  100 mcg Subcutaneous Q Fri-1800   docusate sodium  100 mg Oral BID   enoxaparin (LOVENOX) injection  30 mg Subcutaneous Q24H   lidocaine  2 patch Transdermal Q24H   pantoprazole  40 mg Oral BID   potassium chloride  40 mEq Oral Q4H   sodium chloride, acetaminophen, diclofenac Sodium, nitroGLYCERIN, ondansetron (ZOFRAN) IV  Assessment/ Plan:  April [name] is a 81 y.o.  female with past medical history including diastolic CHF, anemia, CAD, paroxysmal atrial fibrillation without anticoagulation.  GI bleed.  Chronic kidney disease stage IV.  Patient reports to ED with complaints of lower extremity edema.  Patient has  been admitted for Acute CHF (congestive heart failure) ([place]) [I50.9] Acute decompensated heart failure ([place]) [I50.9]   Acute Kidney Injury on chronic kidney disease stage IV with baseline creatinine 1.75 and GFR of 29 on 08/24/21.  Acute kidney injury secondary to volume overload and diuresis.  No IV contrast exposure.  No acute need for dialysis.  Renal function continues to slowly improve on furosemide drip.  Continue to avoid nephrotoxic agents and therapies.   Lab Results  Component Value Date   CREATININE 2.06 (H) 12/03/2021   CREATININE 2.05 (H) 12/02/2021   CREATININE 2.12 (H) 12/01/2021    Intake/Output Summary (Last 24 hours) at 12/03/2021 0935 Last data filed  at 12/03/2021 0711 Gross per 24 hour  Intake 697.12 ml  Output 800 ml  Net -102.88 ml    2. Chronic diastolic heart failure. ECHO from 08/10/21 shows EF 60-65% with a Grade II diastolic dysfunction with a mildly mitral valve regurgitation.    Desired results will be achieved with furosemide drip. A single dose of Metolazone was given yesterday.  Continue daily standing weights in addition to fluid output recording.  3. Anemia of chronic kidney disease Lab Results  Component Value Date   HGB 7.9 (L) 12/03/2021    Hemoglobin not at goal.  We will continue to monitor.  4. Hypokalemia. Most recent K 2.9 on IV lasix. Started on supplementation. Dr. Holley Raring started IV  potassium    LOS: [place] 8/5/20239:35 AM

## 2021-12-03 NOTE — Progress Notes (Addendum)
PROGRESS NOTE    April [name]  UXL:[id number] DOB: 08/31/1940 DOA: 11/23/2021 PCP: Einar Pheasant, MD    Brief Narrative:   81 y.o. female with with history of chronic diastolic CHF last EF measured in April 2023 was 43 to 65% with grade 2 diastolic dysfunction, chronic and disease stage IV, anemia, sleep apnea, CAD status post tenting, history of paroxysmal atrial fibrillation not on anticoagulation secondary to recurrent GI bleed was referred to the ER by patient's cardiologist after patient was found to have increasing peripheral edema.  Patient states over the last 1 week patient has gained at least 4 pounds and has been having increasing peripheral edema.  Patient had gone to her nephrologist 2 days ago and her torsemide was increased from 20 twice daily and it is now 40 twice daily which patient has been taking for last 2 days.  Volume removal has been challenging.  Started on Lasix gtt. on 7/31.  UOP improving.  Nephrology on consult.   Assessment & Plan:   Principal Problem:   Acute on chronic diastolic CHF (congestive heart failure) (HCC) Active Problems:   CAD S/P PCI pRCA Promus DES 3.5 x 16 (4.1 mm), ostRPDA Promus DES 2.5 x 12 (2.7 mm)   Paroxysmal atrial fibrillation (HCC)   Essential hypertension, benign   OSA on CPAP   Acute CHF (congestive heart failure) (HCC)   Acute decompensated heart failure (HCC)  Acute on chronic diastolic congestive heart failure EF in April 2023 53-66%, grade 2 diastolic dysfunction 4 pound weight gain in the last 1 week Torsemide dosing recently increased Was sent in by outpatient cardiology UOP not accurately recorded Difficult to mobilize fluid.  Was started on lasix gtt. Plan: --cont lasix gtt per nephrology  Hypokalemia --monitor and replete PRN  Obstructive sleep apnea --cont CPAP nightly  Coronary disease status post PCI No chest pain, no concern for ACS at this time Does not appear to be on any antiplatelet  agents --outpatient cardio f/u  Paroxysmal atrial fibrillation Currently in sinus rhythm No anticoagulation secondary to history of GI bleed --cont amiodarone  Chronic kidney disease stage IV Creatinine at or near baseline --nephrology consulted   DVT prophylaxis: SQ Lovenox Code Status: Full Family Communication:   Disposition Plan: home  Level of care: Telemetry Cardiac  Consultants:  Nephrology  Procedures:  None  Antimicrobials: None   Subjective: Pt reported feeling shaky, achy, not feeling well today.  No cough, no dysuria.     Objective: Vitals:   12/03/21 0431 12/03/21 0807 12/03/21 1202 12/03/21 1700  BP:  114/61 130/67 (!) 118/55  Pulse:  66 69 67  Resp:  '17 17 17  '$ Temp:  97.9 F (36.6 C) 98.2 F (36.8 C)   TempSrc:      SpO2:  100% 99% 100%  Weight: 81.6 kg     Height:        Intake/Output Summary (Last 24 hours) at 12/03/2021 1839 Last data filed at 12/03/2021 1139 Gross per 24 hour  Intake 360.86 ml  Output 700 ml  Net -339.14 ml   Filed Weights   12/01/21 0500 12/02/21 0512 12/03/21 0431  Weight: 82 kg 79.3 kg 81.6 kg    Examination:  Constitutional: NAD, AAOx3, sitting in recliner HEENT: conjunctivae and lids normal, EOMI CV: No cyanosis.   RESP: normal respiratory effort, lung sounds clear, on RA SKIN: warm, dry Neuro: II - XII grossly intact.   Psych: more depressed mood and affect today.   Data Reviewed:  I have personally reviewed following labs and imaging studies  CBC: Recent Labs  Lab 11/28/21 1141 11/29/21 2329 12/01/21 0449 12/02/21 0549 12/03/21 0546  WBC 8.8 10.7* 8.8 8.6 8.8  NEUTROABS 6.3  --   --   --   --   HGB 9.5* 9.0* 8.2* 7.9* 7.9*  HCT 29.7* 26.7* 23.3* 23.5* 23.3*  MCV 93.7 90.2 88.6 89.0 89.3  PLT 184 207 179 196 370   Basic Metabolic Panel: Recent Labs  Lab 11/29/21 0344 11/29/21 2329 11/30/21 0501 12/01/21 0449 12/02/21 0549 12/03/21 0546  NA 131* 132* 135 133* 136 138  K 3.3* 4.5  3.3* 3.4* 3.3* 2.9*  CL 100 97* 99 100 103 102  CO2 '25 26 27 24 26 26  '$ GLUCOSE 116* 136* 129* 97 81 93  BUN 39* 41* 41* 42* 40* 42*  CREATININE 2.01* 2.13* 2.13* 2.12* 2.05* 2.06*  CALCIUM 8.2* 8.5* 8.3* 8.3* 8.3* 8.4*  MG 2.1  --  2.2 2.3 2.3 2.5*   GFR: Estimated Creatinine Clearance: 19.6 mL/min (A) (by C-G formula based on SCr of 2.06 mg/dL (H)). Liver Function Tests: Recent Labs  Lab 11/29/21 2329  AST 76*  ALT 32  ALKPHOS 99  BILITOT 1.7*  PROT 6.2*  ALBUMIN 2.4*    No results for input(s): "LIPASE", "AMYLASE" in the last 168 hours. No results for input(s): "AMMONIA" in the last 168 hours. Coagulation Profile: Recent Labs  Lab 11/29/21 2329  INR 1.3*   Cardiac Enzymes: No results for input(s): "CKTOTAL", "CKMB", "CKMBINDEX", "TROPONINI" in the last 168 hours. BNP (last 3 results) No results for input(s): "PROBNP" in the last 8760 hours. HbA1C: No results for input(s): "HGBA1C" in the last 72 hours. CBG: Recent Labs  Lab 11/30/21 0727 11/30/21 1132 11/30/21 1631  GLUCAP 102* 116* 134*   Lipid Profile: No results for input(s): "CHOL", "HDL", "LDLCALC", "TRIG", "CHOLHDL", "LDLDIRECT" in the last 72 hours. Thyroid Function Tests: No results for input(s): "TSH", "T4TOTAL", "FREET4", "T3FREE", "THYROIDAB" in the last 72 hours.  Anemia Panel: No results for input(s): "VITAMINB12", "FOLATE", "FERRITIN", "TIBC", "IRON", "RETICCTPCT" in the last 72 hours. Sepsis Labs: No results for input(s): "PROCALCITON", "LATICACIDVEN" in the last 168 hours.  No results found for this or any previous visit (from the past 240 hour(s)).       Radiology Studies: No results found.      Scheduled Meds:  amiodarone  200 mg Oral Daily   darbepoetin (ARANESP) injection - NON-DIALYSIS  100 mcg Subcutaneous Q Fri-1800   docusate sodium  100 mg Oral BID   enoxaparin (LOVENOX) injection  30 mg Subcutaneous Q24H   lidocaine  2 patch Transdermal Q24H   pantoprazole  40 mg  Oral BID   Continuous Infusions:  sodium chloride Stopped (12/03/21 1136)   furosemide (LASIX) 200 mg in dextrose 5 % 100 mL (2 mg/mL) infusion 6 mg/hr (12/03/21 1508)     LOS: 9 days    Enzo Bi, MD [facility]   If 7PM-7AM, please contact night-coverage  12/03/2021, 6:39 PM

## 2021-12-03 NOTE — Plan of Care (Signed)

## 2021-12-04 DIAGNOSIS — I5033 Acute on chronic diastolic (congestive) heart failure: Secondary | ICD-10-CM | POA: Diagnosis not present

## 2021-12-04 LAB — CBC
HCT: 23 % — ABNORMAL LOW (ref 36.0–46.0)
Hemoglobin: 7.8 g/dL — ABNORMAL LOW (ref 12.0–15.0)
MCH: 30.2 pg (ref 26.0–34.0)
MCHC: 33.9 g/dL (ref 30.0–36.0)
MCV: 89.1 fL (ref 80.0–100.0)
Platelets: 179 10*3/uL (ref 150–400)
RBC: 2.58 MIL/uL — ABNORMAL LOW (ref 3.87–5.11)
RDW: 20.6 % — ABNORMAL HIGH (ref 11.5–15.5)
WBC: 9.5 10*3/uL (ref 4.0–10.5)
nRBC: 0 % (ref 0.0–0.2)

## 2021-12-04 LAB — BASIC METABOLIC PANEL
Anion gap: 9 (ref 5–15)
BUN: 43 mg/dL — ABNORMAL HIGH (ref 8–23)
CO2: 25 mmol/L (ref 22–32)
Calcium: 8.3 mg/dL — ABNORMAL LOW (ref 8.9–10.3)
Chloride: 103 mmol/L (ref 98–111)
Creatinine, Ser: 2.42 mg/dL — ABNORMAL HIGH (ref 0.44–1.00)
GFR, Estimated: 19 mL/min — ABNORMAL LOW (ref >60)
Glucose, Bld: 86 mg/dL (ref 70–99)
Potassium: 4.1 mmol/L (ref 3.5–5.1)
Sodium: 137 mmol/L (ref 135–145)

## 2021-12-04 LAB — MAGNESIUM: Magnesium: 2.3 mg/dL (ref 1.7–2.4)

## 2021-12-04 NOTE — Progress Notes (Signed)
[facility]  ROUNDING NOTE   Subjective:   April [name] is a 81 year old female with past medical history including diastolic CHF, anemia, CAD, paroxysmal atrial fibrillation without anticoagulation.  GI bleed.  Chronic kidney disease stage IV.  Patient reports to ED with complaints of lower extremity edema.  Patient has been admitted for Acute CHF (congestive heart failure) ([place]) [I50.9] Acute decompensated heart failure ([place]) [I50.9]  Patient is known to our practice and is followed outpatient by Dr. Candiss Norse.    Patient seen sitting up in bed    States edema has improved. Denies any shortness of breath  Patient reports that she was on torsemide outpatint '20mg'$  three times daily   Creatinine stable 2.42 Daily weight decreasing    Objective:  Vital signs in last 24 hours:  Temp:  [98 F (36.7 C)-98.4 F (36.9 C)] 98 F (36.7 C) (08/06 0748) Pulse Rate:  [64-73] 66 (08/06 1221) Resp:  [15-18] 18 (08/06 1221) BP: (111-136)/(44-62) 111/48 (08/06 1221) SpO2:  [96 %-100 %] 96 % (08/06 1221) Weight:  [79.9 kg] 79.9 kg (08/06 0403)  Weight change: -1.657 kg Filed Weights   12/02/21 0512 12/03/21 0431 12/04/21 0403  Weight: 79.3 kg 81.6 kg 79.9 kg    Intake/Output: I/O last 3 completed shifts: In: 411.5 [P.O.:240; I.V.:171.5] Out: 700 [Urine:700]   Intake/Output this shift:  Total I/O In: 240 [P.O.:240] Out: 250 [Urine:250]  Physical Exam: General: NAD  Head: Normocephalic, atraumatic. Moist oral mucosal membranes  Eyes: Anicteric  Lungs:  Clear to auscultation, normal effort, room air  Heart: Regular rate and rhythm  Abdomen:  Soft, nontender, nondistended  Extremities: 2+ peripheral edema.  Neurologic: Nonfocal, moving all four extremities  Skin: No lesions, dry  Access: None    Basic Metabolic Panel: Recent Labs  Lab 11/30/21 0501 12/01/21 0449 12/02/21 0549 12/03/21 0546 12/04/21 0453  NA 135 133* 136 138 137  K 3.3* 3.4* 3.3* 2.9* 4.1   CL 99 100 103 102 103  CO2 '27 24 26 26 25  '$ GLUCOSE 129* 97 81 93 86  BUN 41* 42* 40* 42* 43*  CREATININE 2.13* 2.12* 2.05* 2.06* 2.42*  CALCIUM 8.3* 8.3* 8.3* 8.4* 8.3*  MG 2.2 2.3 2.3 2.5* 2.3     Liver Function Tests: Recent Labs  Lab 11/29/21 2329  AST 76*  ALT 32  ALKPHOS 99  BILITOT 1.7*  PROT 6.2*  ALBUMIN 2.4*    No results for input(s): "LIPASE", "AMYLASE" in the last 168 hours. No results for input(s): "AMMONIA" in the last 168 hours.  CBC: Recent Labs  Lab 11/28/21 1141 11/29/21 2329 12/01/21 0449 12/02/21 0549 12/03/21 0546 12/04/21 0453  WBC 8.8 10.7* 8.8 8.6 8.8 9.5  NEUTROABS 6.3  --   --   --   --   --   HGB 9.5* 9.0* 8.2* 7.9* 7.9* 7.8*  HCT 29.7* 26.7* 23.3* 23.5* 23.3* 23.0*  MCV 93.7 90.2 88.6 89.0 89.3 89.1  PLT 184 207 179 196 186 179     Cardiac Enzymes: No results for input(s): "CKTOTAL", "CKMB", "CKMBINDEX", "TROPONINI" in the last 168 hours.  BNP: Invalid input(s): "POCBNP"  CBG: Recent Labs  Lab 11/30/21 0727 11/30/21 1132 11/30/21 1631  GLUCAP 102* 116* 134*     Microbiology: Results for orders placed or performed during the hospital encounter of 07/07/21  Resp Panel by RT-PCR (Flu A&B, Covid) Nasopharyngeal Swab     Status: None   Collection Time: 07/07/21  8:01 PM   Specimen:  Nasopharyngeal Swab; Nasopharyngeal(NP) swabs in vial transport medium  Result Value Ref Range Status   SARS Coronavirus 2 by RT PCR NEGATIVE NEGATIVE Final    Comment: (NOTE) SARS-CoV-2 target nucleic acids are NOT DETECTED.  The SARS-CoV-2 RNA is generally detectable in upper respiratory specimens during the acute phase of infection. The lowest concentration of SARS-CoV-2 viral copies this assay can detect is 138 copies/mL. A negative result does not preclude SARS-Cov-2 infection and should not be used as the sole basis for treatment or other patient management decisions. A negative result may occur with  improper specimen  collection/handling, submission of specimen other than nasopharyngeal swab, presence of viral mutation(s) within the areas targeted by this assay, and inadequate number of viral copies(<138 copies/mL). A negative result must be combined with clinical observations, patient history, and epidemiological information. The expected result is Negative.  Fact Sheet for Patients:  EntrepreneurPulse.com.au  Fact Sheet for Healthcare Providers:  IncredibleEmployment.be  This test is no t yet approved or cleared by the [place] FDA and  has been authorized for detection and/or diagnosis of SARS-CoV-2 by FDA under an Emergency Use Authorization (EUA). This EUA will remain  in effect (meaning this test can be used) for the duration of the COVID-19 declaration under Section 564(b)(1) of the Act, 21 U.S.C.section 360bbb-3(b)(1), unless the authorization is terminated  or revoked sooner.       Influenza A by PCR NEGATIVE NEGATIVE Final   Influenza B by PCR NEGATIVE NEGATIVE Final    Comment: (NOTE) The Xpert Xpress SARS-CoV-2/FLU/RSV plus assay is intended as an aid in the diagnosis of influenza from Nasopharyngeal swab specimens and should not be used as a sole basis for treatment. Nasal washings and aspirates are unacceptable for Xpert Xpress SARS-CoV-2/FLU/RSV testing.  Fact Sheet for Patients: EntrepreneurPulse.com.au  Fact Sheet for Healthcare Providers: IncredibleEmployment.be  This test is not yet approved or cleared by the [place] FDA and has been authorized for detection and/or diagnosis of SARS-CoV-2 by FDA under an Emergency Use Authorization (EUA). This EUA will remain in effect (meaning this test can be used) for the duration of the COVID-19 declaration under Section 564(b)(1) of the Act, 21 U.S.C. section 360bbb-3(b)(1), unless the authorization is terminated or revoked.  Performed at [facility], [address] [place], [place] [address]     Coagulation Studies: No results for input(s): "LABPROT", "INR" in the last 72 hours.   Urinalysis: No results for input(s): "COLORURINE", "LABSPEC", "PHURINE", "GLUCOSEU", "HGBUR", "BILIRUBINUR", "KETONESUR", "PROTEINUR", "UROBILINOGEN", "NITRITE", "LEUKOCYTESUR" in the last 72 hours.  Invalid input(s): "APPERANCEUR"    Imaging: No results found.   Medications:    sodium chloride Stopped (12/03/21 1136)    amiodarone  200 mg Oral Daily   darbepoetin (ARANESP) injection - NON-DIALYSIS  100 mcg Subcutaneous Q Fri-1800   docusate sodium  100 mg Oral BID   enoxaparin (LOVENOX) injection  30 mg Subcutaneous Q24H   lidocaine  2 patch Transdermal Q24H   pantoprazole  40 mg Oral BID   sodium chloride, acetaminophen, diclofenac Sodium, nitroGLYCERIN, ondansetron (ZOFRAN) IV  Assessment/ Plan:  April [name] is a 81 y.o.  female with past medical history including diastolic CHF, anemia, CAD, paroxysmal atrial fibrillation without anticoagulation.  GI bleed.  Chronic kidney disease stage IV.  Patient reports to ED with complaints of lower extremity edema.  Patient has been admitted for Acute CHF (congestive heart failure) ([place]) [I50.9] Acute decompensated heart failure ([place]) [I50.9]   Acute Kidney Injury  on chronic kidney disease stage IV with baseline creatinine 1.75 and GFR of 29 on 08/24/21.  Acute kidney injury secondary to volume overload and diuresis.  No IV contrast exposure.  No acute need for dialysis.  Volume status improved with lasix gtt, will discontinue. May restart home dose of torsemide tomorrow.  Creatinine 2.42, increase from previous.    Lab Results  Component Value Date   CREATININE 2.42 (H) 12/04/2021   CREATININE 2.06 (H) 12/03/2021   CREATININE 2.05 (H) 12/02/2021    Intake/Output Summary (Last 24 hours) at 12/04/2021 1254 Last data filed at 12/04/2021 1221 Gross per 24 hour  Intake 290.62  ml  Output 250 ml  Net 40.62 ml    2. Chronic diastolic heart failure. ECHO from 08/10/21 shows EF 60-65% with a Grade II diastolic dysfunction with a mildly mitral valve regurgitation.      3. Anemia of chronic kidney disease Lab Results  Component Value Date   HGB 7.8 (L) 12/04/2021    Hemoglobin not at goal.  We will continue to monitor.  4. Hypokalemia. Most recent K 4.9. on oral potassium supplementation    LOS: [place] 8/6/202312:54 PM

## 2021-12-04 NOTE — Progress Notes (Signed)
PROGRESS NOTE    April [name]  WEX:[id number] DOB: [birth year] DOA: 11/23/2021 PCP: Einar Pheasant, MD    Brief Narrative:   81 y.o. female with with history of chronic diastolic CHF last EF measured in April 2023 was 84 to 65% with grade 2 diastolic dysfunction, chronic and disease stage IV, anemia, sleep apnea, CAD status post tenting, history of paroxysmal atrial fibrillation not on anticoagulation secondary to recurrent GI bleed was referred to the ER by patient's cardiologist after patient was found to have increasing peripheral edema.  Patient states over the last 1 week patient has gained at least 4 pounds and has been having increasing peripheral edema.  Patient had gone to her nephrologist 2 days ago and her torsemide was increased from 20 twice daily and it is now 40 twice daily which patient has been taking for last 2 days.  Volume removal has been challenging.  Started on Lasix gtt. on 7/31.  UOP improving.  Nephrology on consult.   Assessment & Plan:   Principal Problem:   Acute on chronic diastolic CHF (congestive heart failure) (HCC) Active Problems:   CAD S/P PCI pRCA Promus DES 3.5 x 16 (4.1 mm), ostRPDA Promus DES 2.5 x 12 (2.7 mm)   Paroxysmal atrial fibrillation (HCC)   Essential hypertension, benign   OSA on CPAP   Acute CHF (congestive heart failure) (HCC)   Acute decompensated heart failure (HCC)  Acute on chronic diastolic congestive heart failure EF in April 2023 93-81%, grade 2 diastolic dysfunction 4 pound weight gain in the last 1 week Torsemide dosing recently increased Was sent in by outpatient cardiology UOP not accurately recorded Difficult to mobilize fluid.  Was started on lasix gtt. Plan: --d/c lasix gtt today  Hypokalemia --monitor and replete PRN  Obstructive sleep apnea --cont CPAP nightly  Coronary disease status post PCI No chest pain, no concern for ACS at this time Does not appear to be on any antiplatelet agents --outpatient  cardio f/u  Paroxysmal atrial fibrillation Currently in sinus rhythm No anticoagulation secondary to history of GI bleed --cont amiodarone  AKI on  Chronic kidney disease stage IV Acute kidney injury secondary to volume overload and diuresis.  --nephrology consulted  Anemia of chronic kidney disease --Aranesp per nephrololgy   DVT prophylaxis: SQ Lovenox Code Status: Full Family Communication:   Disposition Plan: home  Level of care: Telemetry Cardiac  Consultants:  Nephrology  Procedures:  None  Antimicrobials: None   Subjective: Pt reported feeling better today.    Cr trended up, lasix gtt d/c'ed today.   Objective: Vitals:   12/04/21 0403 12/04/21 0748 12/04/21 1221 12/04/21 1512  BP: 117/62 (!) 114/44 (!) 111/48 (!) 110/52  Pulse: 64 71 66 67  Resp: '15 17 18 17  '$ Temp: 98.2 F (36.8 C) 98 F (36.7 C)  98 F (36.7 C)  TempSrc: Oral     SpO2: 100% 97% 96% 97%  Weight: 79.9 kg     Height:        Intake/Output Summary (Last 24 hours) at 12/04/2021 1651 Last data filed at 12/04/2021 1630 Gross per 24 hour  Intake 530.62 ml  Output 350 ml  Net 180.62 ml   Filed Weights   12/02/21 0512 12/03/21 0431 12/04/21 0403  Weight: 79.3 kg 81.6 kg 79.9 kg    Examination:  Constitutional: NAD, AAOx3, sitting in recliner HEENT: conjunctivae and lids normal, EOMI CV: No cyanosis.   RESP: normal respiratory effort, on RA Extremities: edema in BLE with  chronic venous stasis changes SKIN: warm, dry Neuro: II - XII grossly intact.   Psych: Normal mood and affect.  Appropriate judgement and reason   Data Reviewed: I have personally reviewed following labs and imaging studies  CBC: Recent Labs  Lab 11/28/21 1141 11/29/21 2329 12/01/21 0449 12/02/21 0549 12/03/21 0546 12/04/21 0453  WBC 8.8 10.7* 8.8 8.6 8.8 9.5  NEUTROABS 6.3  --   --   --   --   --   HGB 9.5* 9.0* 8.2* 7.9* 7.9* 7.8*  HCT 29.7* 26.7* 23.3* 23.5* 23.3* 23.0*  MCV 93.7 90.2 88.6 89.0  89.3 89.1  PLT 184 207 179 196 186 016   Basic Metabolic Panel: Recent Labs  Lab 11/30/21 0501 12/01/21 0449 12/02/21 0549 12/03/21 0546 12/04/21 0453  NA 135 133* 136 138 137  K 3.3* 3.4* 3.3* 2.9* 4.1  CL 99 100 103 102 103  CO2 '27 24 26 26 25  '$ GLUCOSE 129* 97 81 93 86  BUN 41* 42* 40* 42* 43*  CREATININE 2.13* 2.12* 2.05* 2.06* 2.42*  CALCIUM 8.3* 8.3* 8.3* 8.4* 8.3*  MG 2.2 2.3 2.3 2.5* 2.3   GFR: Estimated Creatinine Clearance: 16.5 mL/min (A) (by C-G formula based on SCr of 2.42 mg/dL (H)). Liver Function Tests: Recent Labs  Lab 11/29/21 2329  AST 76*  ALT 32  ALKPHOS 99  BILITOT 1.7*  PROT 6.2*  ALBUMIN 2.4*    No results for input(s): "LIPASE", "AMYLASE" in the last 168 hours. No results for input(s): "AMMONIA" in the last 168 hours. Coagulation Profile: Recent Labs  Lab 11/29/21 2329  INR 1.3*   Cardiac Enzymes: No results for input(s): "CKTOTAL", "CKMB", "CKMBINDEX", "TROPONINI" in the last 168 hours. BNP (last 3 results) No results for input(s): "PROBNP" in the last 8760 hours. HbA1C: No results for input(s): "HGBA1C" in the last 72 hours. CBG: Recent Labs  Lab 11/30/21 0727 11/30/21 1132 11/30/21 1631  GLUCAP 102* 116* 134*   Lipid Profile: No results for input(s): "CHOL", "HDL", "LDLCALC", "TRIG", "CHOLHDL", "LDLDIRECT" in the last 72 hours. Thyroid Function Tests: No results for input(s): "TSH", "T4TOTAL", "FREET4", "T3FREE", "THYROIDAB" in the last 72 hours.  Anemia Panel: No results for input(s): "VITAMINB12", "FOLATE", "FERRITIN", "TIBC", "IRON", "RETICCTPCT" in the last 72 hours. Sepsis Labs: No results for input(s): "PROCALCITON", "LATICACIDVEN" in the last 168 hours.  No results found for this or any previous visit (from the past 240 hour(s)).       Radiology Studies: No results found.      Scheduled Meds:  amiodarone  200 mg Oral Daily   darbepoetin (ARANESP) injection - NON-DIALYSIS  100 mcg Subcutaneous Q  Fri-1800   docusate sodium  100 mg Oral BID   enoxaparin (LOVENOX) injection  30 mg Subcutaneous Q24H   lidocaine  2 patch Transdermal Q24H   pantoprazole  40 mg Oral BID   Continuous Infusions:  sodium chloride Stopped (12/03/21 1136)     LOS: 10 days    Enzo Bi, MD [facility]   If 7PM-7AM, please contact night-coverage  12/04/2021, 4:51 PM

## 2021-12-04 NOTE — Progress Notes (Signed)
Physical Therapy Treatment Patient Details Name: April [name] MRN: [id number] DOB: November 25, 1940 Today's Date: 12/04/2021   History of Present Illness Pt is an 81 y.o. female presenting to hospital 7/26 with increasing peripheral edema.  Pt admitted with acute on chronic diastolic CHF.  PMH includes h/o chronic diastolic CHF, anemia, sleep apnea, CAD s/p tenting, h/o paroxysmal a-fib (not on anticoagulation secondary to recurrent GI bleed), CKD, spasmodic dysphonia, STEMI, urine incontinence, B breast surgery.    PT Comments    Pt walking out of bathroom with tech upon arrival.  Happy to walk more in hallway as back pain from immobility is bothering her.  She is able to complete x 1 lap with RW and slow steady gait.  Remained in recliner after session with needs met.   Recommendations for follow up therapy are one component of a multi-disciplinary discharge planning process, led by the attending physician.  Recommendations may be updated based on patient status, additional functional criteria and insurance authorization.  Follow Up Recommendations  Home health PT     Assistance Recommended at Discharge Set up Supervision/Assistance  Patient can return home with the following A little help with bathing/dressing/bathroom;Assistance with cooking/housework;Assist for transportation;Help with stairs or ramp for entrance   Equipment Recommendations  Rolling walker (2 wheels);BSC/3in1    Recommendations for Other Services       Precautions / Restrictions Precautions Precautions: Fall Restrictions Weight Bearing Restrictions: No     Mobility  Bed Mobility               General bed mobility comments: up with tech coming out of bathroom upon arrival    Transfers                        Ambulation/Gait Ambulation/Gait assistance: Supervision Gait Distance (Feet): 200 Feet Assistive device: Rolling walker (2 wheels) Gait Pattern/deviations: Step-through pattern,  Decreased step length - right, Decreased step length - left, Decreased dorsiflexion - right, Decreased dorsiflexion - left, Decreased stride length Gait velocity: decreased     General Gait Details: 1 lap with supervision   Stairs             Wheelchair Mobility    Modified Rankin (Stroke Patients Only)       Balance Overall balance assessment: Needs assistance Sitting-balance support: No upper extremity supported, Feet supported Sitting balance-Leahy Scale: Good     Standing balance support: Bilateral upper extremity supported, During functional activity Standing balance-Leahy Scale: Good                              Cognition Arousal/Alertness: Awake/alert Behavior During Therapy: WFL for tasks assessed/performed Overall Cognitive Status: Within Functional Limits for tasks assessed                                          Exercises      General Comments        Pertinent Vitals/Pain Pain Assessment Pain Assessment: Faces Faces Pain Scale: Hurts a little bit Pain Location: low back from immobility Pain Descriptors / Indicators: Aching, Sore Pain Intervention(s): Limited activity within patient's tolerance, Monitored during session, Repositioned    Home Living  Prior Function            PT Goals (current goals can now be found in the care plan section) Progress towards PT goals: Progressing toward goals    Frequency    Min 2X/week      PT Plan Current plan remains appropriate    Co-evaluation              AM-PAC PT "6 Clicks" Mobility   Outcome Measure  Help needed turning from your back to your side while in a flat bed without using bedrails?: None Help needed moving from lying on your back to sitting on the side of a flat bed without using bedrails?: None Help needed moving to and from a bed to a chair (including a wheelchair)?: None Help needed standing up from a  chair using your arms (e.g., wheelchair or bedside chair)?: None Help needed to walk in hospital room?: A Little Help needed climbing 3-5 steps with a railing? : A Little 6 Click Score: 22    End of Session Equipment Utilized During Treatment: Gait belt Activity Tolerance: Patient tolerated treatment well Patient left: in chair;with chair alarm set;with call bell/phone within reach Nurse Communication: Mobility status PT Visit Diagnosis: Unsteadiness on feet (R26.81);Muscle weakness (generalized) (M62.81);Other abnormalities of gait and mobility (R26.89)     Time: 6027-8296 PT Time Calculation (min) (ACUTE ONLY): 10 min  Charges:  $Gait Training: 8-22 mins                   Chesley Noon, PTA 12/04/21, 1:08 PM

## 2021-12-05 DIAGNOSIS — I5033 Acute on chronic diastolic (congestive) heart failure: Secondary | ICD-10-CM | POA: Diagnosis not present

## 2021-12-05 LAB — BASIC METABOLIC PANEL
Anion gap: 6 (ref 5–15)
BUN: 46 mg/dL — ABNORMAL HIGH (ref 8–23)
CO2: 26 mmol/L (ref 22–32)
Calcium: 8.3 mg/dL — ABNORMAL LOW (ref 8.9–10.3)
Chloride: 100 mmol/L (ref 98–111)
Creatinine, Ser: 3.01 mg/dL — ABNORMAL HIGH (ref 0.44–1.00)
GFR, Estimated: 15 mL/min — ABNORMAL LOW (ref >60)
Glucose, Bld: 93 mg/dL (ref 70–99)
Potassium: 4.7 mmol/L (ref 3.5–5.1)
Sodium: 132 mmol/L — ABNORMAL LOW (ref 135–145)

## 2021-12-05 LAB — MAGNESIUM: Magnesium: 2.2 mg/dL (ref 1.7–2.4)

## 2021-12-05 LAB — CBC
HCT: 22.4 % — ABNORMAL LOW (ref 36.0–46.0)
Hemoglobin: 7.7 g/dL — ABNORMAL LOW (ref 12.0–15.0)
MCH: 30.7 pg (ref 26.0–34.0)
MCHC: 34.4 g/dL (ref 30.0–36.0)
MCV: 89.2 fL (ref 80.0–100.0)
Platelets: 183 10*3/uL (ref 150–400)
RBC: 2.51 MIL/uL — ABNORMAL LOW (ref 3.87–5.11)
RDW: 20.7 % — ABNORMAL HIGH (ref 11.5–15.5)
WBC: 9.9 10*3/uL (ref 4.0–10.5)
nRBC: 0 % (ref 0.0–0.2)

## 2021-12-05 MED ORDER — ALPRAZOLAM 0.25 MG PO TABS
0.2500 mg | ORAL_TABLET | Freq: Once | ORAL | Status: AC
  Administered 2021-12-05: 0.25 mg via ORAL
  Filled 2021-12-05: qty 1

## 2021-12-05 MED ORDER — SODIUM CHLORIDE 0.9 % IV SOLN
6.2500 mg | Freq: Four times a day (QID) | INTRAVENOUS | Status: DC | PRN
  Filled 2021-12-05: qty 0.25

## 2021-12-05 MED ORDER — SODIUM CHLORIDE 0.9 % IV SOLN: INTRAVENOUS | Status: AC

## 2021-12-05 MED ORDER — PROMETHAZINE HCL 25 MG/ML IJ SOLN
12.5000 mg | Freq: Four times a day (QID) | INTRAMUSCULAR | Status: DC | PRN
  Administered 2021-12-05: 12.5 mg via INTRAVENOUS
  Filled 2021-12-05: qty 12.5

## 2021-12-05 NOTE — Progress Notes (Signed)
PROGRESS NOTE    April [name]  WHQ:[id number] DOB: [birth year] DOA: 11/23/2021 PCP: Einar Pheasant, MD    Brief Narrative:   81 y.o. female with with history of chronic diastolic CHF last EF measured in April 2023 was 78 to 65% with grade 2 diastolic dysfunction, chronic and disease stage IV, anemia, sleep apnea, CAD status post tenting, history of paroxysmal atrial fibrillation not on anticoagulation secondary to recurrent GI bleed was referred to the ER by patient's cardiologist after patient was found to have increasing peripheral edema.  Patient states over the last 1 week patient has gained at least 4 pounds and has been having increasing peripheral edema.  Patient had gone to her nephrologist 2 days ago and her torsemide was increased from 20 twice daily and it is now 40 twice daily which patient has been taking for last 2 days.  Volume removal has been challenging.  Started on Lasix gtt. on 7/31.  UOP improving.  Nephrology on consult.   Assessment & Plan:   Principal Problem:   Acute on chronic diastolic CHF (congestive heart failure) (HCC) Active Problems:   CAD S/P PCI pRCA Promus DES 3.5 x 16 (4.1 mm), ostRPDA Promus DES 2.5 x 12 (2.7 mm)   Paroxysmal atrial fibrillation (HCC)   Essential hypertension, benign   OSA on CPAP   Acute CHF (congestive heart failure) (HCC)   Acute decompensated heart failure (HCC)  Acute on chronic diastolic congestive heart failure EF in April 2023 65-99%, grade 2 diastolic dysfunction 4 pound weight gain in the last 1 week Torsemide dosing recently increased Was sent in by outpatient cardiology UOP not accurately recorded Difficult to mobilize fluid.  Was started on lasix gtt, d/c'ed on 8/6. Plan: --hold diuretic today due to worsening AKI  Hypokalemia --monitor and replete PRN  Obstructive sleep apnea --cont CPAP nightly  Coronary disease status post PCI No chest pain, no concern for ACS at this time Does not appear to be on any  antiplatelet agents --outpatient cardio f/u  Paroxysmal atrial fibrillation Currently in sinus rhythm No anticoagulation secondary to history of GI bleed --cont amiodarone  AKI on  Chronic kidney disease stage IV Acute kidney injury secondary to volume overload and diuresis.  --nephrology consulted --Cr worsened today Plan: --MIVF'@40'$ , per nephrology  Anemia of chronic kidney disease --Aranesp per nephrololgy   DVT prophylaxis: SQ Lovenox Code Status: Full Family Communication:   Disposition Plan: home  Level of care: Telemetry Cardiac  Consultants:  Nephrology  Procedures:  None  Antimicrobials: None   Subjective: Pt had more N/V today.    Cr trending up more this morning.   Objective: Vitals:   12/05/21 0414 12/05/21 0827 12/05/21 1222 12/05/21 1517  BP: (!) 114/51 (!) 119/49 (!) 112/46 (!) 129/56  Pulse: 66 67 66 67  Resp: '18 18 18 17  '$ Temp: 98 F (36.7 C) 98.1 F (36.7 C) 97.6 F (36.4 C) 97.7 F (36.5 C)  TempSrc: Oral Oral Oral   SpO2: 97% 95% 96% 98%  Weight: 79.1 kg     Height:        Intake/Output Summary (Last 24 hours) at 12/05/2021 1952 Last data filed at 12/05/2021 1700 Gross per 24 hour  Intake 730 ml  Output 350 ml  Net 380 ml   Filed Weights   12/03/21 0431 12/04/21 0403 12/05/21 0414  Weight: 81.6 kg 79.9 kg 79.1 kg    Examination:  Constitutional: NAD, AAOx3, sitting in recliner HEENT: conjunctivae and lids normal, EOMI  CV: No cyanosis.   RESP: normal respiratory effort, on RA SKIN: warm, dry Neuro: II - XII grossly intact.   Psych: depressed mood and affect today.   Data Reviewed: I have personally reviewed following labs and imaging studies  CBC: Recent Labs  Lab 12/01/21 0449 12/02/21 0549 12/03/21 0546 12/04/21 0453 12/05/21 0326  WBC 8.8 8.6 8.8 9.5 9.9  HGB 8.2* 7.9* 7.9* 7.8* 7.7*  HCT 23.3* 23.5* 23.3* 23.0* 22.4*  MCV 88.6 89.0 89.3 89.1 89.2  PLT 179 196 186 179 374   Basic Metabolic  Panel: Recent Labs  Lab 12/01/21 0449 12/02/21 0549 12/03/21 0546 12/04/21 0453 12/05/21 0326  NA 133* 136 138 137 132*  K 3.4* 3.3* 2.9* 4.1 4.7  CL 100 103 102 103 100  CO2 '24 26 26 25 26  '$ GLUCOSE 97 81 93 86 93  BUN 42* 40* 42* 43* 46*  CREATININE 2.12* 2.05* 2.06* 2.42* 3.01*  CALCIUM 8.3* 8.3* 8.4* 8.3* 8.3*  MG 2.3 2.3 2.5* 2.3 2.2   GFR: Estimated Creatinine Clearance: 13.2 mL/min (A) (by C-G formula based on SCr of 3.01 mg/dL (H)). Liver Function Tests: Recent Labs  Lab 11/29/21 2329  AST 76*  ALT 32  ALKPHOS 99  BILITOT 1.7*  PROT 6.2*  ALBUMIN 2.4*    No results for input(s): "LIPASE", "AMYLASE" in the last 168 hours. No results for input(s): "AMMONIA" in the last 168 hours. Coagulation Profile: Recent Labs  Lab 11/29/21 2329  INR 1.3*   Cardiac Enzymes: No results for input(s): "CKTOTAL", "CKMB", "CKMBINDEX", "TROPONINI" in the last 168 hours. BNP (last 3 results) No results for input(s): "PROBNP" in the last 8760 hours. HbA1C: No results for input(s): "HGBA1C" in the last 72 hours. CBG: Recent Labs  Lab 11/30/21 0727 11/30/21 1132 11/30/21 1631  GLUCAP 102* 116* 134*   Lipid Profile: No results for input(s): "CHOL", "HDL", "LDLCALC", "TRIG", "CHOLHDL", "LDLDIRECT" in the last 72 hours. Thyroid Function Tests: No results for input(s): "TSH", "T4TOTAL", "FREET4", "T3FREE", "THYROIDAB" in the last 72 hours.  Anemia Panel: No results for input(s): "VITAMINB12", "FOLATE", "FERRITIN", "TIBC", "IRON", "RETICCTPCT" in the last 72 hours. Sepsis Labs: No results for input(s): "PROCALCITON", "LATICACIDVEN" in the last 168 hours.  No results found for this or any previous visit (from the past 240 hour(s)).       Radiology Studies: No results found.      Scheduled Meds:  amiodarone  200 mg Oral Daily   darbepoetin (ARANESP) injection - NON-DIALYSIS  100 mcg Subcutaneous Q Fri-1800   docusate sodium  100 mg Oral BID   enoxaparin (LOVENOX)  injection  30 mg Subcutaneous Q24H   lidocaine  2 patch Transdermal Q24H   pantoprazole  40 mg Oral BID   Continuous Infusions:  sodium chloride Stopped (12/03/21 1136)   sodium chloride 40 mL/hr at 12/05/21 1211   promethazine (PHENERGAN) injection (IM or IVPB) 12.5 mg (12/05/21 1301)     LOS: 11 days    Enzo Bi, MD [facility]   If 7PM-7AM, please contact night-coverage  12/05/2021, 7:52 PM

## 2021-12-05 NOTE — Progress Notes (Signed)
OT Cancellation Note  Patient Details Name: April [name] MRN: [id number] DOB: 06-29-1940   Cancelled Treatment:    Reason Eval/Treat Not Completed: Other (comment). Hold therapy per RN today. Will re-attempt at later date/time as pt is appropriate.   Ardeth Perfect., MPH, MS, OTR/L ascom [phone] 12/05/21, 12:56 PM

## 2021-12-05 NOTE — Progress Notes (Signed)
       CROSS COVER NOTE  NAME: ANGELMARIE PONZO MRN: [id number] DOB : [birth year]    Date of Service   12/05/2021  HPI/Events of Note   Worsening of chronic tremor  Interventions   Plan: Xanax x1     This document was prepared using Dragon voice recognition software and may include unintentional dictation errors.  Neomia Glass DNP, MHA, FNP-BC Nurse Practitioner [facility] Pager [phone]

## 2021-12-05 NOTE — Care Management Important Message (Signed)
Important Message  Patient Details  Name: April [name] MRN: [id number] Date of Birth: [birth year]   Medicare Important Message Given:  Yes     Dannette Barbara 12/05/2021, 12:17 PM

## 2021-12-05 NOTE — Progress Notes (Addendum)
[facility]  ROUNDING NOTE   Subjective:   April [name] is a 81 year old female with past medical history including diastolic CHF, anemia, CAD, paroxysmal atrial fibrillation without anticoagulation.  GI bleed.  Chronic kidney disease stage IV.  Patient reports to ED with complaints of lower extremity edema.  Patient has been admitted for Acute CHF (congestive heart failure) ([place]) [I50.9] Acute decompensated heart failure ([place]) [I50.9]  Patient is known to our practice and is followed outpatient by Dr. Candiss Norse.    Update Patient seen resting in bed States she feel bad today Patient seen later sitting up in chair Nauseated.   Creatinine increased 3.01   Objective:  Vital signs in last 24 hours:  Temp:  [97.6 F (36.4 C)-98.1 F (36.7 C)] 97.6 F (36.4 C) (08/07 1222) Pulse Rate:  [62-67] 66 (08/07 1222) Resp:  [17-20] 18 (08/07 1222) BP: (110-126)/(46-57) 112/46 (08/07 1222) SpO2:  [95 %-99 %] 96 % (08/07 1222) Weight:  [79.1 kg] 79.1 kg (08/07 0414)  Weight change: -0.8 kg Filed Weights   12/03/21 0431 12/04/21 0403 12/05/21 0414  Weight: 81.6 kg 79.9 kg 79.1 kg    Intake/Output: I/O last 3 completed shifts: In: 508.6 [P.O.:480; I.V.:28.6] Out: 350 [Urine:350]   Intake/Output this shift:  Total I/O In: 120 [P.O.:120] Out: -   Physical Exam: General: NAD  Head: Normocephalic, atraumatic. Moist oral mucosal membranes  Eyes: Anicteric  Lungs:  Clear to auscultation, normal effort, room air  Heart: Regular rate and rhythm  Abdomen:  Soft, nontender, nondistended  Extremities: 2+ peripheral edema.  Neurologic: Nonfocal, moving all four extremities  Skin: No lesions, dry  Access: None    Basic Metabolic Panel: Recent Labs  Lab 12/01/21 0449 12/02/21 0549 12/03/21 0546 12/04/21 0453 12/05/21 0326  NA 133* 136 138 137 132*  K 3.4* 3.3* 2.9* 4.1 4.7  CL 100 103 102 103 100  CO2 '24 26 26 25 26  '$ GLUCOSE 97 81 93 86 93  BUN 42* 40* 42* 43*  46*  CREATININE 2.12* 2.05* 2.06* 2.42* 3.01*  CALCIUM 8.3* 8.3* 8.4* 8.3* 8.3*  MG 2.3 2.3 2.5* 2.3 2.2     Liver Function Tests: Recent Labs  Lab 11/29/21 2329  AST 76*  ALT 32  ALKPHOS 99  BILITOT 1.7*  PROT 6.2*  ALBUMIN 2.4*    No results for input(s): "LIPASE", "AMYLASE" in the last 168 hours. No results for input(s): "AMMONIA" in the last 168 hours.  CBC: Recent Labs  Lab 12/01/21 0449 12/02/21 0549 12/03/21 0546 12/04/21 0453 12/05/21 0326  WBC 8.8 8.6 8.8 9.5 9.9  HGB 8.2* 7.9* 7.9* 7.8* 7.7*  HCT 23.3* 23.5* 23.3* 23.0* 22.4*  MCV 88.6 89.0 89.3 89.1 89.2  PLT 179 196 186 179 183     Cardiac Enzymes: No results for input(s): "CKTOTAL", "CKMB", "CKMBINDEX", "TROPONINI" in the last 168 hours.  BNP: Invalid input(s): "POCBNP"  CBG: Recent Labs  Lab 11/30/21 0727 11/30/21 1132 11/30/21 1631  GLUCAP 102* 116* 134*     Microbiology: Results for orders placed or performed during the hospital encounter of 07/07/21  Resp Panel by RT-PCR (Flu A&B, Covid) Nasopharyngeal Swab     Status: None   Collection Time: 07/07/21  8:01 PM   Specimen: Nasopharyngeal Swab; Nasopharyngeal(NP) swabs in vial transport medium  Result Value Ref Range Status   SARS Coronavirus 2 by RT PCR NEGATIVE NEGATIVE Final    Comment: (NOTE) SARS-CoV-2 target nucleic acids are NOT DETECTED.  The SARS-CoV-2 RNA  is generally detectable in upper respiratory specimens during the acute phase of infection. The lowest concentration of SARS-CoV-2 viral copies this assay can detect is 138 copies/mL. A negative result does not preclude SARS-Cov-2 infection and should not be used as the sole basis for treatment or other patient management decisions. A negative result may occur with  improper specimen collection/handling, submission of specimen other than nasopharyngeal swab, presence of viral mutation(s) within the areas targeted by this assay, and inadequate number of  viral copies(<138 copies/mL). A negative result must be combined with clinical observations, patient history, and epidemiological information. The expected result is Negative.  Fact Sheet for Patients:  EntrepreneurPulse.com.au  Fact Sheet for Healthcare Providers:  IncredibleEmployment.be  This test is no t yet approved or cleared by the [place] FDA and  has been authorized for detection and/or diagnosis of SARS-CoV-2 by FDA under an Emergency Use Authorization (EUA). This EUA will remain  in effect (meaning this test can be used) for the duration of the COVID-19 declaration under Section 564(b)(1) of the Act, 21 U.S.C.section 360bbb-3(b)(1), unless the authorization is terminated  or revoked sooner.       Influenza A by PCR NEGATIVE NEGATIVE Final   Influenza B by PCR NEGATIVE NEGATIVE Final    Comment: (NOTE) The Xpert Xpress SARS-CoV-2/FLU/RSV plus assay is intended as an aid in the diagnosis of influenza from Nasopharyngeal swab specimens and should not be used as a sole basis for treatment. Nasal washings and aspirates are unacceptable for Xpert Xpress SARS-CoV-2/FLU/RSV testing.  Fact Sheet for Patients: EntrepreneurPulse.com.au  Fact Sheet for Healthcare Providers: IncredibleEmployment.be  This test is not yet approved or cleared by the [place] FDA and has been authorized for detection and/or diagnosis of SARS-CoV-2 by FDA under an Emergency Use Authorization (EUA). This EUA will remain in effect (meaning this test can be used) for the duration of the COVID-19 declaration under Section 564(b)(1) of the Act, 21 U.S.C. section 360bbb-3(b)(1), unless the authorization is terminated or revoked.  Performed at [facility], [address] [place], [place] [address]     Coagulation Studies: No results for input(s): "LABPROT", "INR" in the last 72 hours.   Urinalysis: No  results for input(s): "COLORURINE", "LABSPEC", "PHURINE", "GLUCOSEU", "HGBUR", "BILIRUBINUR", "KETONESUR", "PROTEINUR", "UROBILINOGEN", "NITRITE", "LEUKOCYTESUR" in the last 72 hours.  Invalid input(s): "APPERANCEUR"    Imaging: No results found.   Medications:    sodium chloride Stopped (12/03/21 1136)   sodium chloride 40 mL/hr at 12/05/21 1211   promethazine (PHENERGAN) injection (IM or IVPB) 12.5 mg (12/05/21 1301)    amiodarone  200 mg Oral Daily   darbepoetin (ARANESP) injection - NON-DIALYSIS  100 mcg Subcutaneous Q Fri-1800   docusate sodium  100 mg Oral BID   enoxaparin (LOVENOX) injection  30 mg Subcutaneous Q24H   lidocaine  2 patch Transdermal Q24H   pantoprazole  40 mg Oral BID   sodium chloride, acetaminophen, diclofenac Sodium, nitroGLYCERIN, ondansetron (ZOFRAN) IV, promethazine (PHENERGAN) injection (IM or IVPB)  Assessment/ Plan:  April [name] is a 81 y.o.  female with past medical history including diastolic CHF, anemia, CAD, paroxysmal atrial fibrillation without anticoagulation.  GI bleed.  Chronic kidney disease stage IV.  Patient reports to ED with complaints of lower extremity edema.  Patient has been admitted for Acute CHF (congestive heart failure) ([place]) [I50.9] Acute decompensated heart failure (HCC) [I50.9]   Acute Kidney Injury on chronic kidney disease stage IV with baseline creatinine 1.75 and GFR of 29  on 08/24/21.  Acute kidney injury secondary to volume overload and diuresis.  No IV contrast exposure.  No acute need for dialysis.  Lasix drip stopped yesterday. Was considering restarting Torsemide but will hold due to creatinine elevating today. Will provide a trial IVF, normal saline 76m/hr to rehydrate for 24 hours. Will reassess tomorrow.    Lab Results  Component Value Date   CREATININE 3.01 (H) 12/05/2021   CREATININE 2.42 (H) 12/04/2021   CREATININE 2.06 (H) 12/03/2021    Intake/Output Summary (Last 24 hours) at 12/05/2021  1501 Last data filed at 12/05/2021 1000 Gross per 24 hour  Intake 360 ml  Output 100 ml  Net 260 ml    2. Chronic diastolic heart failure. ECHO from 08/10/21 shows EF 60-65% with a Grade II diastolic dysfunction with a mildly mitral valve regurgitation.      3. Anemia of chronic kidney disease Lab Results  Component Value Date   HGB 7.7 (L) 12/05/2021    Hemoglobin below target  4. Hypokalemia. Potassium 4.7 today.    LOS: 1[place]8/7/20233:01 PM

## 2021-12-05 NOTE — Progress Notes (Signed)
PT Cancellation Note  Patient Details Name: April [name] MRN: [id number] DOB: Aug 14, 1940   Cancelled Treatment:    Reason Eval/Treat Not Completed: Patient's level of consciousness  Pt offered session this am but declined stating she was nauseous and not feeling well.  Returned after lunch and RN in room and had assisted pt to chair and stated she is not having a good day today and is not appropriate at this time.  Will return at a later time/date and relayed to OT and mobility tech.   Chesley Noon 12/05/2021, 12:51 PM

## 2021-12-06 ENCOUNTER — Inpatient Hospital Stay: Payer: Medicare Other

## 2021-12-06 DIAGNOSIS — I5033 Acute on chronic diastolic (congestive) heart failure: Secondary | ICD-10-CM | POA: Diagnosis not present

## 2021-12-06 LAB — BASIC METABOLIC PANEL
Anion gap: 9 (ref 5–15)
BUN: 51 mg/dL — ABNORMAL HIGH (ref 8–23)
CO2: 24 mmol/L (ref 22–32)
Calcium: 8.4 mg/dL — ABNORMAL LOW (ref 8.9–10.3)
Chloride: 100 mmol/L (ref 98–111)
Creatinine, Ser: 3.29 mg/dL — ABNORMAL HIGH (ref 0.44–1.00)
GFR, Estimated: 13 mL/min — ABNORMAL LOW (ref >60)
Glucose, Bld: 86 mg/dL (ref 70–99)
Potassium: 4.5 mmol/L (ref 3.5–5.1)
Sodium: 133 mmol/L — ABNORMAL LOW (ref 135–145)

## 2021-12-06 LAB — CBC
HCT: 22.8 % — ABNORMAL LOW (ref 36.0–46.0)
Hemoglobin: 7.7 g/dL — ABNORMAL LOW (ref 12.0–15.0)
MCH: 30 pg (ref 26.0–34.0)
MCHC: 33.8 g/dL (ref 30.0–36.0)
MCV: 88.7 fL (ref 80.0–100.0)
Platelets: 194 10*3/uL (ref 150–400)
RBC: 2.57 MIL/uL — ABNORMAL LOW (ref 3.87–5.11)
RDW: 20.1 % — ABNORMAL HIGH (ref 11.5–15.5)
WBC: 10 10*3/uL (ref 4.0–10.5)
nRBC: 0 % (ref 0.0–0.2)

## 2021-12-06 LAB — MAGNESIUM: Magnesium: 2.2 mg/dL (ref 1.7–2.4)

## 2021-12-06 MED ORDER — SODIUM CHLORIDE 0.9 % IV SOLN: INTRAVENOUS | Status: DC

## 2021-12-06 NOTE — Progress Notes (Signed)
PROGRESS NOTE    April [name]  VHQ:[id number] DOB: 03/31/1941 DOA: 11/23/2021 PCP: Einar Pheasant, MD    Brief Narrative:   81 y.o. female with with history of chronic diastolic CHF last EF measured in April 2023 was 17 to 65% with grade 2 diastolic dysfunction, chronic and disease stage IV, anemia, sleep apnea, CAD status post tenting, history of paroxysmal atrial fibrillation not on anticoagulation secondary to recurrent GI bleed was referred to the ER by patient's cardiologist after patient was found to have increasing peripheral edema.  Patient states over the last 1 week patient has gained at least 4 pounds and has been having increasing peripheral edema.  Patient had gone to her nephrologist 2 days ago and her torsemide was increased from 20 twice daily and it is now 40 twice daily which patient has been taking for last 2 days.  Volume removal has been challenging.  Started on Lasix gtt. on 7/31.  UOP improving.  Nephrology on consult.   Assessment & Plan:   Principal Problem:   Acute on chronic diastolic CHF (congestive heart failure) (HCC) Active Problems:   CAD S/P PCI pRCA Promus DES 3.5 x 16 (4.1 mm), ostRPDA Promus DES 2.5 x 12 (2.7 mm)   Paroxysmal atrial fibrillation (HCC)   Essential hypertension, benign   OSA on CPAP   Acute CHF (congestive heart failure) (HCC)   Acute decompensated heart failure (HCC)  Acute on chronic diastolic congestive heart failure EF in April 2023 41-32%, grade 2 diastolic dysfunction 4 pound weight gain in the last 1 week Torsemide dosing recently increased Was sent in by outpatient cardiology UOP not accurately recorded Difficult to mobilize fluid.  Was started on lasix gtt, d/c'ed on 8/6. Plan: --cont to hold diuretic due to worsening AKI  Hypokalemia --monitor and replete PRN  Obstructive sleep apnea --cont CPAP nightly  Coronary disease status post PCI No chest pain, no concern for ACS at this time Does not appear to be on  any antiplatelet agents --outpatient cardio f/u  Paroxysmal atrial fibrillation Currently in sinus rhythm No anticoagulation secondary to history of GI bleed --cont amiodarone  AKI, worsening Chronic kidney disease stage IV Acute kidney injury secondary to volume overload and diuresis.  --nephrology consulted --Cr trended up from 1.77 on 7/30 to 3.29 this morning. Plan: --cont MIVF'@40'$ , per nephro --MIVF'@40'$ , per nephrology  Anemia of chronic kidney disease --Aranesp per nephrololgy   DVT prophylaxis: SQ Lovenox Code Status: Full Family Communication: friend updated at bedside today  Disposition Plan: home  Level of care: Telemetry Cardiac  Consultants:  Nephrology  Procedures:  None  Antimicrobials: None   Subjective: No N/V today, but pt complained of tremors and jerking movement of body.  Cr continues to trend up.   Objective: Vitals:   12/06/21 0439 12/06/21 0719 12/06/21 1109 12/06/21 1447  BP:  (!) 121/52 115/72 (!) 135/51  Pulse:  (!) 57 (!) 55 62  Resp:  '14 14 14  '$ Temp:  98.4 F (36.9 C) (!) 97.5 F (36.4 C) 97.7 F (36.5 C)  TempSrc:   Oral Oral  SpO2:  96%  98%  Weight: 85.7 kg     Height:        Intake/Output Summary (Last 24 hours) at 12/06/2021 1915 Last data filed at 12/06/2021 1839 Gross per 24 hour  Intake 480 ml  Output 550 ml  Net -70 ml   Filed Weights   12/04/21 0403 12/05/21 0414 12/06/21 0439  Weight: 79.9 kg 79.1 kg  85.7 kg    Examination:  Constitutional: NAD, AAOx3, sitting in recliner HEENT: conjunctivae and lids normal, EOMI CV: No cyanosis.   RESP: normal respiratory effort, on RA Extremities: non-pitting edema of BLE with chronic venous stasis changes SKIN: warm, dry Neuro: II - XII grossly intact.   Psych: Normal mood and affect.  Appropriate judgement and reason   Data Reviewed: I have personally reviewed following labs and imaging studies  CBC: Recent Labs  Lab 12/02/21 0549 12/03/21 0546  12/04/21 0453 12/05/21 0326 December 21, 2021 0431  WBC 8.6 8.8 9.5 9.9 10.0  HGB 7.9* 7.9* 7.8* 7.7* 7.7*  HCT 23.5* 23.3* 23.0* 22.4* 22.8*  MCV 89.0 89.3 89.1 89.2 88.7  PLT 196 186 179 183 563   Basic Metabolic Panel: Recent Labs  Lab 12/02/21 0549 12/03/21 0546 12/04/21 0453 12/05/21 0326 2021-12-21 0431  NA 136 138 137 132* 133*  K 3.3* 2.9* 4.1 4.7 4.5  CL 103 102 103 100 100  CO2 '26 26 25 26 24  '$ GLUCOSE 81 93 86 93 86  BUN 40* 42* 43* 46* 51*  CREATININE 2.05* 2.06* 2.42* 3.01* 3.29*  CALCIUM 8.3* 8.4* 8.3* 8.3* 8.4*  MG 2.3 2.5* 2.3 2.2 2.2   GFR: Estimated Creatinine Clearance: 12.6 mL/min (A) (by C-G formula based on SCr of 3.29 mg/dL (H)). Liver Function Tests: Recent Labs  Lab 11/29/21 2329  AST 76*  ALT 32  ALKPHOS 99  BILITOT 1.7*  PROT 6.2*  ALBUMIN 2.4*    No results for input(s): "LIPASE", "AMYLASE" in the last 168 hours. No results for input(s): "AMMONIA" in the last 168 hours. Coagulation Profile: Recent Labs  Lab 11/29/21 2329  INR 1.3*   Cardiac Enzymes: No results for input(s): "CKTOTAL", "CKMB", "CKMBINDEX", "TROPONINI" in the last 168 hours. BNP (last 3 results) No results for input(s): "PROBNP" in the last 8760 hours. HbA1C: No results for input(s): "HGBA1C" in the last 72 hours. CBG: Recent Labs  Lab 11/30/21 0727 11/30/21 1132 11/30/21 1631  GLUCAP 102* 116* 134*   Lipid Profile: No results for input(s): "CHOL", "HDL", "LDLCALC", "TRIG", "CHOLHDL", "LDLDIRECT" in the last 72 hours. Thyroid Function Tests: No results for input(s): "TSH", "T4TOTAL", "FREET4", "T3FREE", "THYROIDAB" in the last 72 hours.  Anemia Panel: No results for input(s): "VITAMINB12", "FOLATE", "FERRITIN", "TIBC", "IRON", "RETICCTPCT" in the last 72 hours. Sepsis Labs: No results for input(s): "PROCALCITON", "LATICACIDVEN" in the last 168 hours.  No results found for this or any previous visit (from the past 240 hour(s)).       Radiology Studies: US  RENAL  Result Date: 21-Dec-2021 CLINICAL DATA:  Abdominal ultrasound 11/08/2021. Abdominopelvic CT 11/29/2020. EXAM: RENAL / URINARY TRACT ULTRASOUND COMPLETE COMPARISON:  None Available. FINDINGS: Right Kidney: Renal measurements: 9.8 x 3.8 x 4.3 cm = volume: 79.5 mL. Echogenicity within normal limits. No mass or hydronephrosis visualized. Left Kidney: Renal measurements: 10.4 x 4.5 x 3.5 cm = volume: 85.4 mL. Small cysts measuring up to 3.2 cm in the lower pole. No evidence of solid renal mass or hydronephrosis. Bladder: Appears normal for degree of bladder distention. Other: Ascites noted. The visualized liver demonstrates contour irregularity, consistent with cirrhosis. IMPRESSION: 1. Both kidneys are similar in size, without hydronephrosis. 2. Hepatic cirrhosis and ascites. Electronically Signed   By: Richardean Sale M.D.   On: 12/21/21 16:35        Scheduled Meds:  amiodarone  200 mg Oral Daily   darbepoetin (ARANESP) injection - NON-DIALYSIS  100 mcg Subcutaneous Q Fri-1800  docusate sodium  100 mg Oral BID   enoxaparin (LOVENOX) injection  30 mg Subcutaneous Q24H   lidocaine  2 patch Transdermal Q24H   pantoprazole  40 mg Oral BID   Continuous Infusions:  sodium chloride Stopped (12/03/21 1136)   sodium chloride 40 mL/hr at 12/06/21 1545   promethazine (PHENERGAN) injection (IM or IVPB) 12.5 mg (12/05/21 1301)     LOS: 12 days    Enzo Bi, MD [facility]   If 7PM-7AM, please contact night-coverage  12/06/2021, 7:15 PM

## 2021-12-06 NOTE — Progress Notes (Signed)
Occupational Therapy Treatment Patient Details Name: April [name] MRN: [id number] DOB: 04/22/1941 Today's Date: 12/06/2021   History of present illness Pt is an 81 y.o. female presenting to hospital 7/26 with increasing peripheral edema.  Pt admitted with acute on chronic diastolic CHF.  PMH includes h/o chronic diastolic CHF, anemia, sleep apnea, CAD s/p tenting, h/o paroxysmal a-fib (not on anticoagulation secondary to recurrent GI bleed), CKD, spasmodic dysphonia, STEMI, urine incontinence, B breast surgery.   OT comments  Pt seen for OT tx. Pt endorses fatigue and through session does demonstrate continued poor activity tolerance and strength with only fair balance. She required CGA-MIN A for ADL transfers from std height surfaces with RW, MIN A for pericare and clothing mgt after toileting. Compared to session with this OT on last Friday, pt has not made substantial progress. Pt educated in importance of brief rest breaks to minimize over exertion and falls risk. Continue to recommend [facility] for now, but would like to see pt progress further in order to maximize safe discharge home. May need to update recommendation to SNF if pt does not demonstrate progress in the next day or so.    Recommendations for follow up therapy are one component of a multi-disciplinary discharge planning process, led by the attending physician.  Recommendations may be updated based on patient status, additional functional criteria and insurance authorization.    Follow Up Recommendations  Home health OT    Assistance Recommended at Discharge Frequent or constant Supervision/Assistance  Patient can return home with the following  Assistance with cooking/housework;A little help with bathing/dressing/bathroom;A little help with walking and/or transfers;Assist for transportation   Equipment Recommendations  None recommended by OT    Recommendations for Other Services      Precautions / Restrictions  Precautions Precautions: Fall Restrictions Weight Bearing Restrictions: No       Mobility Bed Mobility               General bed mobility comments: NT in recliner    Transfers Overall transfer level: Needs assistance Equipment used: Rolling walker (2 wheels) Transfers: Sit to/from Stand Sit to Stand: Min guard           General transfer comment: very slow, deliberate     Balance Overall balance assessment: Needs assistance Sitting-balance support: No upper extremity supported, Feet supported Sitting balance-Leahy Scale: Fair     Standing balance support: Bilateral upper extremity supported, During functional activity, No upper extremity supported Standing balance-Leahy Scale: Fair                             ADL either performed or assessed with clinical judgement   ADL Overall ADL's : Needs assistance/impaired                         Toilet Transfer: Min guard;Regular Toilet;Rolling walker (2 wheels) Toilet Transfer Details (indicate cue type and reason): slow, increased effort/time Toileting- Clothing Manipulation and Hygiene: Sit to/from stand;Minimal assistance Toileting - Clothing Manipulation Details (indicate cue type and reason): MIN A for hygiene (thoroughness) and clothing mgt to place pad while pt was in standing with BUE support on the RW     Functional mobility during ADLs: Min guard;Rolling walker (2 wheels)      Extremity/Trunk Assessment              Vision       Perception  Praxis      Cognition Arousal/Alertness: Awake/alert Behavior During Therapy: WFL for tasks assessed/performed Overall Cognitive Status: Within Functional Limits for tasks assessed                                          Exercises Other Exercises Other Exercises: Additional instruction in activity pacing and importance of rest breaks to break up activity to minimize over exertion/falls risk    Shoulder  Instructions       General Comments      Pertinent Vitals/ Pain       Pain Assessment Pain Assessment: 0-10 Pain Score: 8  Pain Location: buttocks Pain Descriptors / Indicators: Aching Pain Intervention(s): Limited activity within patient's tolerance, Monitored during session, Repositioned, Patient requesting pain meds-RN notified  Home Living                                          Prior Functioning/Environment              Frequency  Min 2X/week        Progress Toward Goals  OT Goals(current goals can now be found in the care plan section)     Acute Rehab OT Goals Patient Stated Goal: be independent OT Goal Formulation: With patient Time For Goal Achievement: 12/10/21 Potential to Achieve Goals: Good  Plan Discharge plan remains appropriate;Frequency remains appropriate    Co-evaluation                 AM-PAC OT "6 Clicks" Daily Activity     Outcome Measure   Help from another person eating meals?: None Help from another person taking care of personal grooming?: None Help from another person toileting, which includes using toliet, bedpan, or urinal?: A Little Help from another person bathing (including washing, rinsing, drying)?: A Little Help from another person to put on and taking off regular upper body clothing?: None Help from another person to put on and taking off regular lower body clothing?: A Little 6 Click Score: 21    End of Session Equipment Utilized During Treatment: Rolling walker (2 wheels)  OT Visit Diagnosis: Muscle weakness (generalized) (M62.81)   Activity Tolerance Patient tolerated treatment well   Patient Left in chair;with call bell/phone within reach;with chair alarm set;with nursing/sitter in room   Nurse Communication Patient requests pain meds        Time: 3295-1884 OT Time Calculation (min): 29 min  Charges: OT General Charges $OT Visit: 1 Visit OT Treatments $Self Care/Home Management  : 23-37 mins  Ardeth Perfect., MPH, MS, OTR/L ascom [phone] 12/06/21, 3:47 PM

## 2021-12-06 NOTE — Progress Notes (Signed)
[facility]  ROUNDING NOTE   Subjective:   April [name] is a 81 year old female with past medical history including diastolic CHF, anemia, CAD, paroxysmal atrial fibrillation without anticoagulation.  GI bleed.  Chronic kidney disease stage IV.  Patient reports to ED with complaints of lower extremity edema.  Patient has been admitted for Acute CHF (congestive heart failure) ([place]) [I50.9] Acute decompensated heart failure ([place]) [I50.9]  Patient is known to our practice and is followed outpatient by Dr. Candiss Norse.    Update Patient seen resting in bed, BiPAP remains from overnight Patient states she feels better than yesterday Able to tolerate dinner without nausea or vomiting States she is hungry this morning, awaiting breakfast Lower extremity remains edematous however improved Room air at baseline. Patient feels urine output has decreased.   Objective:  Vital signs in last 24 hours:  Temp:  [97.5 F (36.4 C)-98.4 F (36.9 C)] 97.5 F (36.4 C) (08/08 1109) Pulse Rate:  [55-71] 55 (08/08 1109) Resp:  [14-18] 14 (08/08 1109) BP: (115-139)/(52-72) 115/72 (08/08 1109) SpO2:  [96 %-100 %] 96 % (08/08 0719) Weight:  [85.7 kg] 85.7 kg (08/08 0439)  Weight change: 6.6 kg Filed Weights   12/04/21 0403 12/05/21 0414 12/06/21 0439  Weight: 79.9 kg 79.1 kg 85.7 kg    Intake/Output: I/O last 3 completed shifts: In: 66 [P.O.:480; I.V.:200; IV Piggyback:50] Out: 350 [Urine:350]   Intake/Output this shift:  Total I/O In: 240 [P.O.:240] Out: -   Physical Exam: General: NAD  Head: Normocephalic, atraumatic. Moist oral mucosal membranes  Eyes: Anicteric  Lungs:  Clear to auscultation, normal effort, room air  Heart: Regular rate and rhythm  Abdomen:  Soft, nontender, nondistended  Extremities: 2+ peripheral edema.  Neurologic: Nonfocal, moving all four extremities  Skin: No lesions, dry  Access: None    Basic Metabolic Panel: Recent Labs  Lab 12/02/21 0549  12/03/21 0546 12/04/21 0453 12/05/21 0326 12/06/21 0431  NA 136 138 137 132* 133*  K 3.3* 2.9* 4.1 4.7 4.5  CL 103 102 103 100 100  CO2 '26 26 25 26 24  '$ GLUCOSE 81 93 86 93 86  BUN 40* 42* 43* 46* 51*  CREATININE 2.05* 2.06* 2.42* 3.01* 3.29*  CALCIUM 8.3* 8.4* 8.3* 8.3* 8.4*  MG 2.3 2.5* 2.3 2.2 2.2     Liver Function Tests: Recent Labs  Lab 11/29/21 2329  AST 76*  ALT 32  ALKPHOS 99  BILITOT 1.7*  PROT 6.2*  ALBUMIN 2.4*    No results for input(s): "LIPASE", "AMYLASE" in the last 168 hours. No results for input(s): "AMMONIA" in the last 168 hours.  CBC: Recent Labs  Lab 12/02/21 0549 12/03/21 0546 12/04/21 0453 12/05/21 0326 12/06/21 0431  WBC 8.6 8.8 9.5 9.9 10.0  HGB 7.9* 7.9* 7.8* 7.7* 7.7*  HCT 23.5* 23.3* 23.0* 22.4* 22.8*  MCV 89.0 89.3 89.1 89.2 88.7  PLT 196 186 179 183 194     Cardiac Enzymes: No results for input(s): "CKTOTAL", "CKMB", "CKMBINDEX", "TROPONINI" in the last 168 hours.  BNP: Invalid input(s): "POCBNP"  CBG: Recent Labs  Lab 11/30/21 0727 11/30/21 1132 11/30/21 1631  GLUCAP 102* 116* 134*     Microbiology: Results for orders placed or performed during the hospital encounter of 07/07/21  Resp Panel by RT-PCR (Flu A&B, Covid) Nasopharyngeal Swab     Status: None   Collection Time: 07/07/21  8:01 PM   Specimen: Nasopharyngeal Swab; Nasopharyngeal(NP) swabs in vial transport medium  Result Value Ref Range Status  SARS Coronavirus 2 by RT PCR NEGATIVE NEGATIVE Final    Comment: (NOTE) SARS-CoV-2 target nucleic acids are NOT DETECTED.  The SARS-CoV-2 RNA is generally detectable in upper respiratory specimens during the acute phase of infection. The lowest concentration of SARS-CoV-2 viral copies this assay can detect is 138 copies/mL. A negative result does not preclude SARS-Cov-2 infection and should not be used as the sole basis for treatment or other patient management decisions. A negative result may occur with   improper specimen collection/handling, submission of specimen other than nasopharyngeal swab, presence of viral mutation(s) within the areas targeted by this assay, and inadequate number of viral copies(<138 copies/mL). A negative result must be combined with clinical observations, patient history, and epidemiological information. The expected result is Negative.  Fact Sheet for Patients:  EntrepreneurPulse.com.au  Fact Sheet for Healthcare Providers:  IncredibleEmployment.be  This test is no t yet approved or cleared by the [place] FDA and  has been authorized for detection and/or diagnosis of SARS-CoV-2 by FDA under an Emergency Use Authorization (EUA). This EUA will remain  in effect (meaning this test can be used) for the duration of the COVID-19 declaration under Section 564(b)(1) of the Act, 21 U.S.C.section 360bbb-3(b)(1), unless the authorization is terminated  or revoked sooner.       Influenza A by PCR NEGATIVE NEGATIVE Final   Influenza B by PCR NEGATIVE NEGATIVE Final    Comment: (NOTE) The Xpert Xpress SARS-CoV-2/FLU/RSV plus assay is intended as an aid in the diagnosis of influenza from Nasopharyngeal swab specimens and should not be used as a sole basis for treatment. Nasal washings and aspirates are unacceptable for Xpert Xpress SARS-CoV-2/FLU/RSV testing.  Fact Sheet for Patients: EntrepreneurPulse.com.au  Fact Sheet for Healthcare Providers: IncredibleEmployment.be  This test is not yet approved or cleared by the [place] FDA and has been authorized for detection and/or diagnosis of SARS-CoV-2 by FDA under an Emergency Use Authorization (EUA). This EUA will remain in effect (meaning this test can be used) for the duration of the COVID-19 declaration under Section 564(b)(1) of the Act, 21 U.S.C. section 360bbb-3(b)(1), unless the authorization is terminated  or revoked.  Performed at [facility], [address] [place], [place] [address]     Coagulation Studies: No results for input(s): "LABPROT", "INR" in the last 72 hours.   Urinalysis: No results for input(s): "COLORURINE", "LABSPEC", "PHURINE", "GLUCOSEU", "HGBUR", "BILIRUBINUR", "KETONESUR", "PROTEINUR", "UROBILINOGEN", "NITRITE", "LEUKOCYTESUR" in the last 72 hours.  Invalid input(s): "APPERANCEUR"    Imaging: No results found.   Medications:    sodium chloride Stopped (12/03/21 1136)   promethazine (PHENERGAN) injection (IM or IVPB) 12.5 mg (12/05/21 1301)    amiodarone  200 mg Oral Daily   darbepoetin (ARANESP) injection - NON-DIALYSIS  100 mcg Subcutaneous Q Fri-1800   docusate sodium  100 mg Oral BID   enoxaparin (LOVENOX) injection  30 mg Subcutaneous Q24H   lidocaine  2 patch Transdermal Q24H   pantoprazole  40 mg Oral BID   sodium chloride, acetaminophen, diclofenac Sodium, nitroGLYCERIN, ondansetron (ZOFRAN) IV, promethazine (PHENERGAN) injection (IM or IVPB)  Assessment/ Plan:  [name] is a 81 y.o.  female with past medical history including diastolic CHF, anemia, CAD, paroxysmal atrial fibrillation without anticoagulation.  GI bleed.  Chronic kidney disease stage IV.  Patient reports to ED with complaints of lower extremity edema.  Patient has been admitted for Acute CHF (congestive heart failure) ([place]) [I50.9] Acute decompensated heart failure ([place]) [I50.9]   Acute  Kidney Injury on chronic kidney disease stage IV with baseline creatinine 1.75 and GFR of 29 on 08/24/21.  Acute kidney injury secondary to volume overload and diuresis.  No IV contrast exposure.  No acute need for dialysis.   Lasix drip stopped and torsemide remains held due to elevated creatinine.  Creatinine increased overnight.  Will continue IV fluids for additional day.  Will order renal ultrasound to evaluate possible obstruction due to dark appearance of  urine.   Lab Results  Component Value Date   CREATININE 3.29 (H) 12/06/2021   CREATININE 3.01 (H) 12/05/2021   CREATININE 2.42 (H) 12/04/2021    Intake/Output Summary (Last 24 hours) at 12/06/2021 1241 Last data filed at 12/06/2021 1044 Gross per 24 hour  Intake 850 ml  Output 350 ml  Net 500 ml    2. Chronic diastolic heart failure. ECHO from 08/10/21 shows EF 60-65% with a Grade II diastolic dysfunction with a mildly mitral valve regurgitation.  Diuretics remain held.    3. Anemia of chronic kidney disease Lab Results  Component Value Date   HGB 7.7 (L) 12/06/2021    Hemoglobin below target, but stable.  4. Hypokalemia. Potassium stable   LOS: 12 Zakaiya Lares 8/8/202312:41 PM

## 2021-12-06 NOTE — Plan of Care (Signed)
  Problem: Activity: Goal: Risk for activity intolerance will decrease Outcome: Progressing   Problem: Pain Managment: Goal: General experience of comfort will improve Outcome: Progressing   Problem: Skin Integrity: Goal: Risk for impaired skin integrity will decrease Outcome: Progressing   

## 2021-12-06 NOTE — Progress Notes (Signed)
Mobility Specialist - Progress Note    12/06/21 1143  Mobility  Activity Ambulated with assistance in hallway  Level of Assistance Contact guard assist, steadying assist  Assistive Device Front wheel walker  Distance Ambulated (ft) 160 ft  Activity Response Tolerated well  $Mobility charge 1 Mobility    Pt sitting in recliner on RA upon arrival. Pt sts and ambulates 1 lap around NS CGA. Pt needed 3 rest breaks and had 1 LOB corrected by CGA. Pt returns to recliner with needs in reach and chair alarm on.   April [name]  Mobility Specialist  12/06/21 11:46 AM

## 2021-12-06 NOTE — Progress Notes (Signed)
PT Cancellation Note  Patient Details Name: April [name] MRN: [id number] DOB: [birth year]   Cancelled Treatment:    Reason Eval/Treat Not Completed: Other (comment).  Chart reviewed and attempted to see pt.  Pt noting that she has already been up with mobility specialist and is too fatigued to perform therapy at this time.  Pt did ambulate around the nursing station with mobility.  Will re-attempt at later date as medically appropriate.   Gwenlyn Saran, PT, DPT 12/06/21, 2:42 PM

## 2021-12-07 DIAGNOSIS — I5033 Acute on chronic diastolic (congestive) heart failure: Secondary | ICD-10-CM | POA: Diagnosis not present

## 2021-12-07 DIAGNOSIS — E871 Hypo-osmolality and hyponatremia: Secondary | ICD-10-CM

## 2021-12-07 DIAGNOSIS — N184 Chronic kidney disease, stage 4 (severe): Secondary | ICD-10-CM

## 2021-12-07 LAB — CBC
HCT: 22.2 % — ABNORMAL LOW (ref 36.0–46.0)
Hemoglobin: 7.6 g/dL — ABNORMAL LOW (ref 12.0–15.0)
MCH: 29.8 pg (ref 26.0–34.0)
MCHC: 34.2 g/dL (ref 30.0–36.0)
MCV: 87.1 fL (ref 80.0–100.0)
Platelets: 193 10*3/uL (ref 150–400)
RBC: 2.55 MIL/uL — ABNORMAL LOW (ref 3.87–5.11)
RDW: 19.6 % — ABNORMAL HIGH (ref 11.5–15.5)
WBC: 8.6 10*3/uL (ref 4.0–10.5)
nRBC: 0 % (ref 0.0–0.2)

## 2021-12-07 LAB — MAGNESIUM: Magnesium: 2.2 mg/dL (ref 1.7–2.4)

## 2021-12-07 LAB — BASIC METABOLIC PANEL
Anion gap: 6 (ref 5–15)
BUN: 53 mg/dL — ABNORMAL HIGH (ref 8–23)
CO2: 24 mmol/L (ref 22–32)
Calcium: 8.1 mg/dL — ABNORMAL LOW (ref 8.9–10.3)
Chloride: 101 mmol/L (ref 98–111)
Creatinine, Ser: 3.34 mg/dL — ABNORMAL HIGH (ref 0.44–1.00)
GFR, Estimated: 13 mL/min — ABNORMAL LOW (ref >60)
Glucose, Bld: 87 mg/dL (ref 70–99)
Potassium: 4.5 mmol/L (ref 3.5–5.1)
Sodium: 131 mmol/L — ABNORMAL LOW (ref 135–145)

## 2021-12-07 NOTE — Progress Notes (Signed)
Mobility Specialist - Progress Note    12/07/21 1300  Mobility  Activity Ambulated with assistance in hallway  Level of Assistance Contact guard assist, steadying assist  Distance Ambulated (ft) 160 ft  Activity Response Tolerated well  $Mobility charge 1 Mobility   Pt sitting in recliner on RA upon arrival. Pt sts and ambulates 1 lap around NS CGA. Pt needed 1 rest break but motivated to complete lap. Pt returns to recliner with needs in reach.  Gretchen Short  Mobility Specialist  12/07/21 1:31 PM

## 2021-12-07 NOTE — Hospital Course (Addendum)
81 yo F with dCHF, AF on amiodarone no AC, CKD IV baseline 2-2.4 who presented for abdominal and leg swelling and weight gain.   Patient was initially thought to have CHF or nephrogenic edema, and so Nephrology were consulted and she was given escalating doses of Lasix.  Ultimately she developed hepatic encephalopathy, and it became clear her weight gain was ascites and hepatic swelling was a major contributor.   In the meantime, she failed to respond to diuretics, and her renal function worsened and she was started on dialysis.

## 2021-12-07 NOTE — Assessment & Plan Note (Signed)
Not on anticoagulation - Continue amiodarone

## 2021-12-07 NOTE — Assessment & Plan Note (Addendum)
See above, cardiology feel that acute CHF is ruled out. - Hold metolazone and torsemide - Consult Cardiology

## 2021-12-07 NOTE — Progress Notes (Signed)
Physical Therapy Treatment Patient Details Name: April [name] MRN: [id number] DOB: Apr 14, 1941 Today's Date: 12/07/2021   History of Present Illness 81 y.o. female presented to hospital 7/26 with peripheral edema.  Pt admitted with acute on chronic diastolic CHF.  PMH CHF, anemia, sleep apnea, CAD s/p tenting, h/o paroxysmal a-fib (not on anticoagulation secondary to recurrent GI bleed), CKD, spasmodic dysphonia, STEMI, urine incontinence, B breast surgery.    PT Comments    Pt was able to ambulate around the nurses' station with walker and consistent/safe speed.  She reports still feeling swollen in LEs but felt good moving and participation with supine exercises.  She did not have increased pain with activity, subjectively with some fatigue but vitals remained relatively stable with good overall effort.   Recommendations for follow up therapy are one component of a multi-disciplinary discharge planning process, led by the attending physician.  Recommendations may be updated based on patient status, additional functional criteria and insurance authorization.  Follow Up Recommendations  Home health PT     Assistance Recommended at Discharge Set up Supervision/Assistance  Patient can return home with the following A little help with bathing/dressing/bathroom;Assistance with cooking/housework;Assist for transportation;Help with stairs or ramp for entrance   Equipment Recommendations       Recommendations for Other Services       Precautions / Restrictions Precautions Precautions: Fall Restrictions Weight Bearing Restrictions: No     Mobility  Bed Mobility               General bed mobility comments: NT in recliner    Transfers Overall transfer level: Modified independent Equipment used: Rolling walker (2 wheels) Transfers: Sit to/from Stand Sit to Stand: Min guard   Step pivot transfers: Supervision       General transfer comment: minimal cuing to insure  appropriate set up, no direct assist needed    Ambulation/Gait Ambulation/Gait assistance: Supervision Gait Distance (Feet): 200 Feet Assistive device: Rolling walker (2 wheels)         General Gait Details: Pt was able to circumambulate the nurses' station w/ relatively consistent cadence - reports some fatigue but ultimately safe and consistent with longer bout of ambulation - O2 remains in the 90s, HR to 110-130 range t/o   Stairs             Wheelchair Mobility    Modified Rankin (Stroke Patients Only)       Balance Overall balance assessment: Needs assistance Sitting-balance support: No upper extremity supported Sitting balance-Leahy Scale: Good     Standing balance support: Bilateral upper extremity supported, During functional activity, No upper extremity supported Standing balance-Leahy Scale: Good Standing balance comment: no LOBs with standing/ambulation with AD use                            Cognition Arousal/Alertness: Awake/alert Behavior During Therapy: WFL for tasks assessed/performed Overall Cognitive Status: Within Functional Limits for tasks assessed                                          Exercises General Exercises - Lower Extremity Ankle Circles/Pumps: AROM, 10 reps Long Arc Quad: Strengthening, 10 reps Hip ABduction/ADduction: Strengthening, 10 reps Hip Flexion/Marching: AROM, 10 reps, Seated    General Comments        Pertinent Vitals/Pain Pain Assessment Pain Assessment: No/denies pain  Home Living                          Prior Function            PT Goals (current goals can now be found in the care plan section) Progress towards PT goals: Progressing toward goals    Frequency    Min 2X/week      PT Plan Current plan remains appropriate    Co-evaluation              AM-PAC PT "6 Clicks" Mobility   Outcome Measure  Help needed turning from your back to your  side while in a flat bed without using bedrails?: None Help needed moving from lying on your back to sitting on the side of a flat bed without using bedrails?: None Help needed moving to and from a bed to a chair (including a wheelchair)?: None Help needed standing up from a chair using your arms (e.g., wheelchair or bedside chair)?: None Help needed to walk in hospital room?: A Little Help needed climbing 3-5 steps with a railing? : A Little 6 Click Score: 22    End of Session Equipment Utilized During Treatment: Gait belt Activity Tolerance: Patient tolerated treatment well Patient left: in chair;with chair alarm set;with call bell/phone within reach Nurse Communication: Mobility status PT Visit Diagnosis: Unsteadiness on feet (R26.81);Muscle weakness (generalized) (M62.81);Other abnormalities of gait and mobility (R26.89)     Time: 1030-1055 PT Time Calculation (min) (ACUTE ONLY): 25 min  Charges:  $Gait Training: 8-22 mins $Therapeutic Exercise: 8-22 mins                     Kreg Shropshire, DPT 12/07/2021, 1:13 PM

## 2021-12-07 NOTE — Progress Notes (Signed)
  Progress Note   Patient: April [name] QRF:[id number] DOB: February 02, 1941 DOA: 11/23/2021     13 DOS: the patient was seen and examined on 12/07/2021 at 11:59AM      Brief hospital course: 81 yo F with dCHF, AF on amiodarone no AC, CKD IV baseline 2-2.4 who presented for swelling and weight gain.      Assessment and Plan: * Acute on chronic diastolic CHF (congestive heart failure) (HCC) - Hold diuretics - Obtain echo - Consult Cardiology  Acute renal failure superimposed on stage 4 chronic kidney disease ([place]) - Continue IV fluids - Consult Nephrology, appreciate expertise - Daily BMP, strict I/Os  Anemia of chronic kidney failure, stage 4 (severe) (HCC) - Aranesp  Hyponatremia - Continue IV fluids  Chronic kidney disease (CKD), stage IV (severe) (HCC) Cr worse today with fluids  Hypokalemia - Supplement K  Chronic obstructive pulmonary disease, unspecified COPD type ([place]) No active disease  Paroxysmal atrial fibrillation (HCC) Not on anticoagulation - Continue amiodarone  CAD S/P PCI pRCA Promus DES 3.5 x 16 (4.1 mm), ostRPDA Promus DES 2.5 x 12 (2.7 mm) No angina  Morbid obesity ([place]) BMI 36 with history hypertension, atherosclerosis  OSA on CPAP - CPAP at night  Hyperlipidemia Not on statin  Essential hypertension, benign - Hold torsemide          Subjective: Feels overall tired, no fever, no confusion, no sputum, no respiratory confusion     Physical Exam: Vitals:   12/07/21 0407 12/07/21 0754 12/07/21 1139 12/07/21 1526  BP: (!) 109/50 (!) 122/50 (!) 115/56 (!) 120/57  Pulse: 65 66 (!) 58 64  Resp: '18 19 18 18  '$ Temp: (!) 97.5 F (36.4 C) 98.4 F (36.9 C) 98.3 F (36.8 C) 98.1 F (36.7 C)  TempSrc:  Oral Oral Oral  SpO2: 97% 98% 99% 98%  Weight: 84.5 kg     Height:       Elderly adult female, sitting up in recliner, no acute distress RRR, JVP not visible, 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

## 2021-12-07 NOTE — Assessment & Plan Note (Addendum)
-   Defer HD to Nephrology

## 2021-12-07 NOTE — Assessment & Plan Note (Signed)
No active disease 

## 2021-12-07 NOTE — Assessment & Plan Note (Signed)
BMI 36 with history hypertension, atherosclerosis

## 2021-12-07 NOTE — Assessment & Plan Note (Signed)
Not on statin 

## 2021-12-07 NOTE — Assessment & Plan Note (Signed)
Resolved

## 2021-12-07 NOTE — Assessment & Plan Note (Signed)
-   Hold torsemide

## 2021-12-07 NOTE — Assessment & Plan Note (Addendum)
S/p tunneled catheter 8/10  HD #1 8/11 HD #2 8/12 Minimal UOP again 200cc last 24  HD #3 8/14 planned - HD per nephrology

## 2021-12-07 NOTE — Assessment & Plan Note (Signed)
Cr worse today with fluids

## 2021-12-07 NOTE — Assessment & Plan Note (Signed)
-   CPAP at night °

## 2021-12-07 NOTE — TOC Progression Note (Signed)
Transition of Care [facility]) - Progression Note    Patient Details  Name: April [name] MRN: [id number] Date of Birth: 04-Sep-1940  Transition of Care [facility]) CM/SW [place], LCSW Phone Number: 12/07/2021, 8:34 AM  Clinical Narrative: TOC continues to follow for return home with home health at discharge.    Expected Discharge Plan: [place] Barriers to Discharge: Continued Medical Work up  Expected Discharge Plan and Services Expected Discharge Plan: [place] Choice: Resumption of Svcs/PTA Provider Living arrangements for the past 2 months: [place] Arranged: RN, PT, OT [facility] Agency: [place] ([facility]) Date HH Agency Contacted: 11/28/21   Representative spoke with at [place]: Floydene Flock   Social Determinants of Health (SDOH) Interventions    Readmission Risk Interventions    11/28/2021   11:17 AM 10/19/2021   12:27 PM  Readmission Risk Prevention Plan  Transportation Screening Complete Complete  PCP or Specialist Appt within 3-5 Days Complete Complete  HRI or [place] Complete Complete  Social Work Consult for [place] Planning/Counseling Complete Complete  Palliative Care Screening Not Applicable Complete  Medication Review Press photographer) Complete Complete

## 2021-12-07 NOTE — Hospital Course (Signed)
81 y.o. female with with history of chronic diastolic CHF last EF measured in April 2023 was 60 to 65% with grade 2 diastolic dysfunction, chronic and disease stage IV, anemia, sleep apnea, CAD status post tenting, history of paroxysmal atrial fibrillation not on anticoagulation secondary to recurrent GI bleed was referred to the ER by patient's cardiologist after patient was found to have increasing peripheral edema.  Patient states over the last 1 week patient has gained at least 4 pounds and has been having increasing peripheral edema.  Patient had gone to her nephrologist 2 days ago and her torsemide was increased from 20 twice daily and it is now 40 twice daily which patient has been taking for last 2 days.

## 2021-12-07 NOTE — Assessment & Plan Note (Addendum)
Patient's Hgb has been trending down steadily over the last few months.  Certainly this is multifactorial with renal failure driving it, and chronic GI blood loss contribtuing.  Iron studies in June showed ferritin 24.  Got IV iron this admission.  Transfused 1 unit 8/13 for Hgb 6.8 g/dL.  Appropriate post-transfusion bump.  - Hold Eliquis - Continue Aranesp

## 2021-12-07 NOTE — Assessment & Plan Note (Deleted)
No angina 

## 2021-12-07 NOTE — Assessment & Plan Note (Signed)
BMI 36 in setting of hypertension, hyperlipidemia

## 2021-12-07 NOTE — Progress Notes (Signed)
[facility]  ROUNDING NOTE   Subjective:   April [name] is a 81 year old female with past medical history including diastolic CHF, anemia, CAD, paroxysmal atrial fibrillation without anticoagulation.  GI bleed.  Chronic kidney disease stage IV.  Patient reports to ED with complaints of lower extremity edema.  Patient has been admitted for Acute CHF (congestive heart failure) ([place]) [I50.9] Acute decompensated heart failure ([place]) [I50.9]  Patient is known to our practice and is followed outpatient by Dr. Candiss Norse.    Update Patient seen sitting up in chair, preparing to eat breakfast Tolerating small meals Denies nausea or vomiting Remains on room air  Sodium 131 Creatinine 3.34  Objective:  Vital signs in last 24 hours:  Temp:  [97.5 F (36.4 C)-98.4 F (36.9 C)] 98.3 F (36.8 C) (08/09 1139) Pulse Rate:  [58-66] 58 (08/09 1139) Resp:  [14-20] 18 (08/09 1139) BP: (109-137)/(50-64) 115/56 (08/09 1139) SpO2:  [97 %-99 %] 99 % (08/09 1139) Weight:  [84.5 kg] 84.5 kg (08/09 0407)  Weight change: -1.2 kg Filed Weights   12/05/21 0414 12/06/21 0439 12/07/21 0407  Weight: 79.1 kg 85.7 kg 84.5 kg    Intake/Output: I/O last 3 completed shifts: In: 89 [P.O.:480; I.V.:425] Out: 750 [Urine:750]   Intake/Output this shift:  Total I/O In: 240 [P.O.:240] Out: 425 [Urine:425]  Physical Exam: General: NAD  Head: Normocephalic, atraumatic. Moist oral mucosal membranes  Eyes: Anicteric  Lungs:  Clear to auscultation, normal effort, room air  Heart: Regular rate and rhythm  Abdomen:  Soft, nontender, nondistended  Extremities: 2+ peripheral edema.  Neurologic: Nonfocal, moving all four extremities  Skin: No lesions, dry  Access: None    Basic Metabolic Panel: Recent Labs  Lab 12/03/21 0546 12/04/21 0453 12/05/21 0326 12/06/21 0431 12/07/21 0444  NA 138 137 132* 133* 131*  K 2.9* 4.1 4.7 4.5 4.5  CL 102 103 100 100 101  CO2 '26 25 26 24 24  '$ GLUCOSE 93  86 93 86 87  BUN 42* 43* 46* 51* 53*  CREATININE 2.06* 2.42* 3.01* 3.29* 3.34*  CALCIUM 8.4* 8.3* 8.3* 8.4* 8.1*  MG 2.5* 2.3 2.2 2.2 2.2     Liver Function Tests: No results for input(s): "AST", "ALT", "ALKPHOS", "BILITOT", "PROT", "ALBUMIN" in the last 168 hours.  No results for input(s): "LIPASE", "AMYLASE" in the last 168 hours. No results for input(s): "AMMONIA" in the last 168 hours.  CBC: Recent Labs  Lab 12/03/21 0546 12/04/21 0453 12/05/21 0326 12/06/21 0431 12/07/21 0444  WBC 8.8 9.5 9.9 10.0 8.6  HGB 7.9* 7.8* 7.7* 7.7* 7.6*  HCT 23.3* 23.0* 22.4* 22.8* 22.2*  MCV 89.3 89.1 89.2 88.7 87.1  PLT 186 179 183 194 193     Cardiac Enzymes: No results for input(s): "CKTOTAL", "CKMB", "CKMBINDEX", "TROPONINI" in the last 168 hours.  BNP: Invalid input(s): "POCBNP"  CBG: Recent Labs  Lab 11/30/21 1631  GLUCAP 134*     Microbiology: Results for orders placed or performed during the hospital encounter of 07/07/21  Resp Panel by RT-PCR (Flu A&B, Covid) Nasopharyngeal Swab     Status: None   Collection Time: 07/07/21  8:01 PM   Specimen: Nasopharyngeal Swab; Nasopharyngeal(NP) swabs in vial transport medium  Result Value Ref Range Status   SARS Coronavirus 2 by RT PCR NEGATIVE NEGATIVE Final    Comment: (NOTE) SARS-CoV-2 target nucleic acids are NOT DETECTED.  The SARS-CoV-2 RNA is generally detectable in upper respiratory specimens during the acute phase of infection. The lowest  concentration of SARS-CoV-2 viral copies this assay can detect is 138 copies/mL. A negative result does not preclude SARS-Cov-2 infection and should not be used as the sole basis for treatment or other patient management decisions. A negative result may occur with  improper specimen collection/handling, submission of specimen other than nasopharyngeal swab, presence of viral mutation(s) within the areas targeted by this assay, and inadequate number of viral copies(<138 copies/mL).  A negative result must be combined with clinical observations, patient history, and epidemiological information. The expected result is Negative.  Fact Sheet for Patients:  EntrepreneurPulse.com.au  Fact Sheet for Healthcare Providers:  IncredibleEmployment.be  This test is no t yet approved or cleared by the [place] FDA and  has been authorized for detection and/or diagnosis of SARS-CoV-2 by FDA under an Emergency Use Authorization (EUA). This EUA will remain  in effect (meaning this test can be used) for the duration of the COVID-19 declaration under Section 564(b)(1) of the Act, 21 U.S.C.section 360bbb-3(b)(1), unless the authorization is terminated  or revoked sooner.       Influenza A by PCR NEGATIVE NEGATIVE Final   Influenza B by PCR NEGATIVE NEGATIVE Final    Comment: (NOTE) The Xpert Xpress SARS-CoV-2/FLU/RSV plus assay is intended as an aid in the diagnosis of influenza from Nasopharyngeal swab specimens and should not be used as a sole basis for treatment. Nasal washings and aspirates are unacceptable for Xpert Xpress SARS-CoV-2/FLU/RSV testing.  Fact Sheet for Patients: EntrepreneurPulse.com.au  Fact Sheet for Healthcare Providers: IncredibleEmployment.be  This test is not yet approved or cleared by the [place] FDA and has been authorized for detection and/or diagnosis of SARS-CoV-2 by FDA under an Emergency Use Authorization (EUA). This EUA will remain in effect (meaning this test can be used) for the duration of the COVID-19 declaration under Section 564(b)(1) of the Act, 21 U.S.C. section 360bbb-3(b)(1), unless the authorization is terminated or revoked.  Performed at [facility], [address] [place], [place] [address]     Coagulation Studies: No results for input(s): "LABPROT", "INR" in the last 72 hours.   Urinalysis: No results for input(s):  "COLORURINE", "LABSPEC", "PHURINE", "GLUCOSEU", "HGBUR", "BILIRUBINUR", "KETONESUR", "PROTEINUR", "UROBILINOGEN", "NITRITE", "LEUKOCYTESUR" in the last 72 hours.  Invalid input(s): "APPERANCEUR"    Imaging: US RENAL  Result Date: 12/06/2021 CLINICAL DATA:  Abdominal ultrasound 11/08/2021. Abdominopelvic CT 11/29/2020. EXAM: RENAL / URINARY TRACT ULTRASOUND COMPLETE COMPARISON:  None Available. FINDINGS: Right Kidney: Renal measurements: 9.8 x 3.8 x 4.3 cm = volume: 79.5 mL. Echogenicity within normal limits. No mass or hydronephrosis visualized. Left Kidney: Renal measurements: 10.4 x 4.5 x 3.5 cm = volume: 85.4 mL. Small cysts measuring up to 3.2 cm in the lower pole. No evidence of solid renal mass or hydronephrosis. Bladder: Appears normal for degree of bladder distention. Other: Ascites noted. The visualized liver demonstrates contour irregularity, consistent with cirrhosis. IMPRESSION: 1. Both kidneys are similar in size, without hydronephrosis. 2. Hepatic cirrhosis and ascites. Electronically Signed   By: Richardean Sale M.D.   On: 12/06/2021 16:35     Medications:    sodium chloride Stopped (12/03/21 1136)   promethazine (PHENERGAN) injection (IM or IVPB) 12.5 mg (12/05/21 1301)    amiodarone  200 mg Oral Daily   darbepoetin (ARANESP) injection - NON-DIALYSIS  100 mcg Subcutaneous Q Fri-1800   docusate sodium  100 mg Oral BID   enoxaparin (LOVENOX) injection  30 mg Subcutaneous Q24H   lidocaine  2 patch Transdermal Q24H   pantoprazole  40 mg Oral BID   sodium chloride, acetaminophen, diclofenac Sodium, nitroGLYCERIN, ondansetron (ZOFRAN) IV, promethazine (PHENERGAN) injection (IM or IVPB)  Assessment/ Plan:  April [name] is a 81 y.o.  female with past medical history including diastolic CHF, anemia, CAD, paroxysmal atrial fibrillation without anticoagulation.  GI bleed.  Chronic kidney disease stage IV.  Patient reports to ED with complaints of lower extremity edema.  Patient  has been admitted for Acute CHF (congestive heart failure) ([place]) [I50.9] Acute decompensated heart failure ([place]) [I50.9]   Acute Kidney Injury with hyponatremia on chronic kidney disease stage IV with baseline creatinine 1.75 and GFR of 29 on 08/24/21.  Acute kidney injury secondary to volume overload and diuresis.  No IV contrast exposure.  No acute need for dialysis.   Renal function appears to be plateauing. Will stop IVF due to decreasing sodium. Sodium 131 today. Will continue to hold diuretics for now and monitor. Would like to see creatinine trending down before consideration of discharge.    Lab Results  Component Value Date   CREATININE 3.34 (H) 12/07/2021   CREATININE 3.29 (H) 12/06/2021   CREATININE 3.01 (H) 12/05/2021    Intake/Output Summary (Last 24 hours) at 12/07/2021 1317 Last data filed at 12/07/2021 1143 Gross per 24 hour  Intake 905.04 ml  Output 1075 ml  Net -169.96 ml    2. Chronic diastolic heart failure. ECHO from 08/10/21 shows EF 60-65% with a Grade II diastolic dysfunction with a mildly mitral valve regurgitation.  Diuretics remain held.    3. Anemia of chronic kidney disease Lab Results  Component Value Date   HGB 7.6 (L) 12/07/2021    Hemoglobin below target, but stable.  4. Hypokalemia. Resolved   LOS: [place] 8/9/20231:17 PM

## 2021-12-08 ENCOUNTER — Inpatient Hospital Stay (HOSPITAL_COMMUNITY)
Admit: 2021-12-08 | Discharge: 2021-12-08 | Disposition: A | Discharge status: Home / Self Care | Payer: Medicare Other | Attending: Family Medicine | Admitting: Family Medicine

## 2021-12-08 ENCOUNTER — Encounter
Admission: EM | Disposition: A | Discharge status: Skilled Nursing Facility | Payer: Self-pay | Source: Home / Self Care | Attending: Family Medicine

## 2021-12-08 DIAGNOSIS — I5033 Acute on chronic diastolic (congestive) heart failure: Secondary | ICD-10-CM | POA: Diagnosis not present

## 2021-12-08 DIAGNOSIS — I428 Other cardiomyopathies: Secondary | ICD-10-CM

## 2021-12-08 DIAGNOSIS — I2510 Atherosclerotic heart disease of native coronary artery without angina pectoris: Secondary | ICD-10-CM | POA: Diagnosis not present

## 2021-12-08 DIAGNOSIS — I5032 Chronic diastolic (congestive) heart failure: Secondary | ICD-10-CM | POA: Diagnosis not present

## 2021-12-08 DIAGNOSIS — N179 Acute kidney failure, unspecified: Secondary | ICD-10-CM | POA: Diagnosis not present

## 2021-12-08 DIAGNOSIS — D631 Anemia in chronic kidney disease: Secondary | ICD-10-CM

## 2021-12-08 DIAGNOSIS — Z9861 Coronary angioplasty status: Secondary | ICD-10-CM

## 2021-12-08 DIAGNOSIS — R609 Edema, unspecified: Secondary | ICD-10-CM

## 2021-12-08 DIAGNOSIS — N184 Chronic kidney disease, stage 4 (severe): Secondary | ICD-10-CM

## 2021-12-08 DIAGNOSIS — N170 Acute kidney failure with tubular necrosis: Secondary | ICD-10-CM

## 2021-12-08 DIAGNOSIS — Z992 Dependence on renal dialysis: Secondary | ICD-10-CM

## 2021-12-08 DIAGNOSIS — I480 Paroxysmal atrial fibrillation: Secondary | ICD-10-CM

## 2021-12-08 HISTORY — PX: TEMPORARY DIALYSIS CATHETER: CATH118312

## 2021-12-08 LAB — CBC
HCT: 22 % — ABNORMAL LOW (ref 36.0–46.0)
Hemoglobin: 7.6 g/dL — ABNORMAL LOW (ref 12.0–15.0)
MCH: 30.5 pg (ref 26.0–34.0)
MCHC: 34.5 g/dL (ref 30.0–36.0)
MCV: 88.4 fL (ref 80.0–100.0)
Platelets: 199 10*3/uL (ref 150–400)
RBC: 2.49 MIL/uL — ABNORMAL LOW (ref 3.87–5.11)
RDW: 19.8 % — ABNORMAL HIGH (ref 11.5–15.5)
WBC: 9.2 10*3/uL (ref 4.0–10.5)
nRBC: 0 % (ref 0.0–0.2)

## 2021-12-08 LAB — IRON AND TIBC
Iron: 35 ug/dL (ref 28–170)
Saturation Ratios: 17 % (ref 10.4–31.8)
TIBC: 211 ug/dL — ABNORMAL LOW (ref 250–450)
UIBC: 176 ug/dL

## 2021-12-08 LAB — ECHOCARDIOGRAM COMPLETE
AR max vel: 0.69 cm2
AV Area VTI: 0.83 cm2
AV Area mean vel: 0.78 cm2
AV Mean grad: 9.3 mmHg
AV Peak grad: 16.4 mmHg
Ao pk vel: 2.03 m/s
Area-P 1/2: 3.27 cm2
Height: 60 in
S' Lateral: 3.2 cm
Weight: 3128.77 oz

## 2021-12-08 LAB — BASIC METABOLIC PANEL
Anion gap: 7 (ref 5–15)
BUN: 53 mg/dL — ABNORMAL HIGH (ref 8–23)
CO2: 24 mmol/L (ref 22–32)
Calcium: 8.2 mg/dL — ABNORMAL LOW (ref 8.9–10.3)
Chloride: 97 mmol/L — ABNORMAL LOW (ref 98–111)
Creatinine, Ser: 3.53 mg/dL — ABNORMAL HIGH (ref 0.44–1.00)
GFR, Estimated: 12 mL/min — ABNORMAL LOW (ref >60)
Glucose, Bld: 92 mg/dL (ref 70–99)
Potassium: 4.4 mmol/L (ref 3.5–5.1)
Sodium: 128 mmol/L — ABNORMAL LOW (ref 135–145)

## 2021-12-08 LAB — FERRITIN: Ferritin: 38 ng/mL (ref 11–307)

## 2021-12-08 LAB — MAGNESIUM: Magnesium: 2.4 mg/dL (ref 1.7–2.4)

## 2021-12-08 SURGERY — TEMPORARY DIALYSIS CATHETER
Anesthesia: Moderate Sedation

## 2021-12-08 MED ORDER — SODIUM CHLORIDE 0.9 % IV SOLN
125.0000 mg | Freq: Every day | INTRAVENOUS | Status: AC
Start: 2021-12-08 — End: 2021-12-10
  Administered 2021-12-08 – 2021-12-10 (×3): 125 mg via INTRAVENOUS
  Filled 2021-12-08 (×3): qty 10

## 2021-12-08 MED ORDER — CHLORHEXIDINE GLUCONATE CLOTH 2 % EX PADS
6.0000 | MEDICATED_PAD | Freq: Every day | CUTANEOUS | Status: DC
  Administered 2021-12-09 – 2021-12-19 (×11): 6 via TOPICAL

## 2021-12-08 MED ORDER — SODIUM CHLORIDE 0.9 % IV SOLN: INTRAVENOUS | Status: DC

## 2021-12-08 SURGICAL SUPPLY — 3 items
COVER PROBE U/S 5X48 (MISCELLANEOUS) ×1 IMPLANT
KIT DIALYSIS CATH TRI 30X13 (CATHETERS) ×1 IMPLANT
PACK ANGIOGRAPHY (CUSTOM PROCEDURE TRAY) ×2 IMPLANT

## 2021-12-08 NOTE — Progress Notes (Signed)
*  PRELIMINARY RESULTS* Echocardiogram 2D Echocardiogram has been performed.  April [name] 12/08/2021, 9:53 AM

## 2021-12-08 NOTE — Progress Notes (Signed)
  Progress Note   Patient: April [name] QIH:[id number] DOB: 30-Jan-1941 DOA: 11/23/2021     14 DOS: the patient was seen and examined on 12/08/2021        Brief hospital course: 81 yo F with dCHF, AF on amiodarone no AC, CKD IV baseline 2-2.4 who presented for swelling and weight gain.      Assessment and Plan: * Acute renal failure superimposed on stage 4 chronic kidney disease (HCC) Cr worsening today.   - Defer HD to Nephrology they plan for permcath today  Anemia of chronic kidney failure, stage 4 (severe) ([place]) Patient's Hgb has been trending down.  Certainly this is multifactorial with renal failure driving it, and chronic GI blood loss contribtuing.  Iron studies in June showed ferritin 24 - Hold Eliquis - Continue Aranesp   Edema Cardiology consulted.  Echo repeated, rules out a significant component of right heart failure/pHTN.  Cardiology feels she does not have acute congestive symptoms and that her swelling is multifactorial from third spacing/hypoalbuminemia, worsening renal failure, venous insufficiency rather than acute congestive heart failure.  Hyponatremia - Defer HD to Nephrology  Chronic kidney disease (CKD), stage IV (severe) ([place]) - Consult Nephrology  Hypokalemia Resolved   Chronic diastolic CHF (congestive heart failure) ([place]) See above, cardiology fellow acute CHF is ruled out. - Hold metolazone and torsemide - Consult Cardiology  Chronic obstructive pulmonary disease, unspecified COPD type ([place]) No active disease  Paroxysmal atrial fibrillation ([place]) Not on anticoagulation - Continue amiodarone  CAD S/P PCI pRCA Promus DES 3.5 x 16 (4.1 mm), ostRPDA Promus DES 2.5 x 12 (2.7 mm) No angina  Morbid obesity ([place]) BMI 36 with history hypertension, atherosclerosis  OSA on CPAP - CPAP at night  Hyperlipidemia Not on statin  Essential hypertension, benign - Hold torsemide          Subjective: Patient still feels tired, weak, no  headache, chest pain.  Still feels somewhat swollen.  Overall malaise.     Physical Exam: Vitals:   12/08/21 0451 12/08/21 0455 12/08/21 0747 12/08/21 1147  BP:  (!) 122/50 112/61 (!) 119/47  Pulse:  65 63 63  Resp:  '18 18 17  '$ Temp:  98.3 F (36.8 C) (!) 97.5 F (36.4 C) 97.7 F (36.5 C)  TempSrc:      SpO2:  97% 97% 98%  Weight: 88.7 kg     Height:       Obese adult female, lying in bed, no acute distress RRR, no murmurs, brawny venous stasis changes both lower extremities Respiratory rate normal, lungs clear without rales or wheezes Abdomen soft no tenderness palpation or guarding, no ascites or distention Attention normal, affect normal, judgment insight appear normal, she appears to have generalized weakness  Data Reviewed: Complete blood count shows hemoglobin in the sevens, platelets normal Basic metabolic panel shows worsening renal function, normal sodium potassium       Disposition: Status is: Inpatient The patient was admitted with swelling, she was diuresed with IV Lasix, but her renal function has gotten worse and her swelling did not improve.  At present, her renal function is worsening and nephrology believe that they will start dialysis        Author: Edwin Dada, MD 12/08/2021 2:31 PM  For on call review [email or web address].

## 2021-12-08 NOTE — Progress Notes (Signed)
OT Cancellation Note  Patient Details Name: April [name] MRN: [id number] DOB: December 25, 1940   Cancelled Treatment:    Reason Eval/Treat Not Completed: Patient at procedure or test/ unavailable. Upon attempt, pt out of the room. Visitors note she went to specials. Will re-attempt OT tx at later date as pt is available and medically appropriate.   Ardeth Perfect., MPH, MS, OTR/L ascom [phone] 12/08/21, 3:49 PM

## 2021-12-08 NOTE — Plan of Care (Signed)
  Problem: Education: Goal: Knowledge of General Education information will improve Description Including pain rating scale, medication(s)/side effects and non-pharmacologic comfort measures Outcome: Progressing   

## 2021-12-08 NOTE — Op Note (Signed)
  OPERATIVE NOTE   PROCEDURE: Ultrasound guidance for vascular access right femoral vein Placement of a 30 cm triple lumen dialysis catheter right femoral vein  PRE-OPERATIVE DIAGNOSIS: 1. Acute renal failure   POST-OPERATIVE DIAGNOSIS: Same  SURGEON: Leotis Pain, MD  ASSISTANT(S): None  ANESTHESIA: local  ESTIMATED BLOOD LOSS: Minimal   FINDING(S): 1.  None  SPECIMEN(S):  None  INDICATIONS:    Patient is a 81 y.o.female who presents with worsening renal failure and need for dialysis.  Risks and benefits were discussed, and informed consent was obtained..  DESCRIPTION: After obtaining full informed written consent, the patient was laid flat in the bed.  The right groin was sterilely prepped and draped in a sterile surgical field was created. The right femoral vein was visualized with ultrasound and found to be widely patent. It was then accessed under direct guidance without difficulty with a Seldinger needle and a permanent image was recorded. A J-wire was then placed. After skin nick and dilatation, a 30 cm trialysis type triple-lumen dialysis catheter was placed over the wire and the wire was removed. The lumens withdrew dark red nonpulsatile blood and flushed easily with sterile saline. The catheter was secured to the skin with 3 nylon sutures. Sterile dressing was placed.  COMPLICATIONS: None  CONDITION: Stable  Leotis Pain 12/08/2021 3:49 PM  This note was created with Dragon Medical transcription system. Any errors in dictation are purely unintentional.

## 2021-12-08 NOTE — Assessment & Plan Note (Addendum)
Cardiology consulted for ?right heart cath needed.  They repeated echo which ruled out a significant component of right heart failure/pHTN.   Her swelling is multifactorial from cirrhosis, third spacing/hypoalbuminemia, worsening renal failure, venous insufficiency. Acute congestive heart failure ruled out.  US abdomen 8/14 showed mild ascites only, not enough to paracentesis at this point.  - If ascites worsened, would obtain paracentesis with albumin

## 2021-12-08 NOTE — Progress Notes (Signed)
[facility]  ROUNDING NOTE   Subjective:   April [name] is a 81 year old female with past medical history including diastolic CHF, anemia, CAD, paroxysmal atrial fibrillation without anticoagulation.  GI bleed.  Chronic kidney disease stage IV.  Patient reports to ED with complaints of lower extremity edema.  Patient has been admitted for Acute CHF (congestive heart failure) ([place]) [I50.9] Acute decompensated heart failure ([place]) [I50.9]  Patient is known to our practice and is followed outpatient by Dr. Candiss Norse.    Update Patient laying in bed Alert and oriented Tolerating small meals  Continues to have nausea at times  Sodium 128 Creatinine 3.53  Objective:  Vital signs in last 24 hours:  Temp:  [97.5 F (36.4 C)-98.3 F (36.8 C)] 97.7 F (36.5 C) (08/10 1147) Pulse Rate:  [63-70] 63 (08/10 1147) Resp:  [17-18] 17 (08/10 1147) BP: (112-132)/(46-61) 119/47 (08/10 1147) SpO2:  [97 %-98 %] 98 % (08/10 1147) Weight:  [88.7 kg] 88.7 kg (08/10 0451)  Weight change: 4.2 kg Filed Weights   12/06/21 0439 12/07/21 0407 12/08/21 0451  Weight: 85.7 kg 84.5 kg 88.7 kg    Intake/Output: I/O last 3 completed shifts: In: 6387 [P.O.:1080; I.V.:425] Out: 1275 [Urine:1275]   Intake/Output this shift:  Total I/O In: 240 [P.O.:240] Out: 200 [Urine:200]  Physical Exam: General: NAD  Head: Normocephalic, atraumatic. Moist oral mucosal membranes  Eyes: Anicteric  Lungs:  Clear to auscultation, normal effort, room air  Heart: Regular rate and rhythm  Abdomen:  Soft, nontender, nondistended  Extremities: 2+ peripheral edema.  Neurologic: Nonfocal, moving all four extremities  Skin: No lesions, dry  Access: None    Basic Metabolic Panel: Recent Labs  Lab 12/04/21 0453 12/05/21 0326 12/06/21 0431 12/07/21 0444 12/08/21 0659  NA 137 132* 133* 131* 128*  K 4.1 4.7 4.5 4.5 4.4  CL 103 100 100 101 97*  CO2 '25 26 24 24 24  '$ GLUCOSE 86 93 86 87 92  BUN 43* 46* 51*  53* 53*  CREATININE 2.42* 3.01* 3.29* 3.34* 3.53*  CALCIUM 8.3* 8.3* 8.4* 8.1* 8.2*  MG 2.3 2.2 2.2 2.2 2.4     Liver Function Tests: No results for input(s): "AST", "ALT", "ALKPHOS", "BILITOT", "PROT", "ALBUMIN" in the last 168 hours.  No results for input(s): "LIPASE", "AMYLASE" in the last 168 hours. No results for input(s): "AMMONIA" in the last 168 hours.  CBC: Recent Labs  Lab 12/04/21 0453 12/05/21 0326 12/06/21 0431 12/07/21 0444 12/08/21 0659  WBC 9.5 9.9 10.0 8.6 9.2  HGB 7.8* 7.7* 7.7* 7.6* 7.6*  HCT 23.0* 22.4* 22.8* 22.2* 22.0*  MCV 89.1 89.2 88.7 87.1 88.4  PLT 179 183 194 193 199     Cardiac Enzymes: No results for input(s): "CKTOTAL", "CKMB", "CKMBINDEX", "TROPONINI" in the last 168 hours.  BNP: Invalid input(s): "POCBNP"  CBG: No results for input(s): "GLUCAP" in the last 168 hours.   Microbiology: Results for orders placed or performed during the hospital encounter of 07/07/21  Resp Panel by RT-PCR (Flu A&B, Covid) Nasopharyngeal Swab     Status: None   Collection Time: 07/07/21  8:01 PM   Specimen: Nasopharyngeal Swab; Nasopharyngeal(NP) swabs in vial transport medium  Result Value Ref Range Status   SARS Coronavirus 2 by RT PCR NEGATIVE NEGATIVE Final    Comment: (NOTE) SARS-CoV-2 target nucleic acids are NOT DETECTED.  The SARS-CoV-2 RNA is generally detectable in upper respiratory specimens during the acute phase of infection. The lowest concentration of SARS-CoV-2 viral copies  this assay can detect is 138 copies/mL. A negative result does not preclude SARS-Cov-2 infection and should not be used as the sole basis for treatment or other patient management decisions. A negative result may occur with  improper specimen collection/handling, submission of specimen other than nasopharyngeal swab, presence of viral mutation(s) within the areas targeted by this assay, and inadequate number of viral copies(<138 copies/mL). A negative result must  be combined with clinical observations, patient history, and epidemiological information. The expected result is Negative.  Fact Sheet for Patients:  EntrepreneurPulse.com.au  Fact Sheet for Healthcare Providers:  IncredibleEmployment.be  This test is no t yet approved or cleared by the [place] FDA and  has been authorized for detection and/or diagnosis of SARS-CoV-2 by FDA under an Emergency Use Authorization (EUA). This EUA will remain  in effect (meaning this test can be used) for the duration of the COVID-19 declaration under Section 564(b)(1) of the Act, 21 U.S.C.section 360bbb-3(b)(1), unless the authorization is terminated  or revoked sooner.       Influenza A by PCR NEGATIVE NEGATIVE Final   Influenza B by PCR NEGATIVE NEGATIVE Final    Comment: (NOTE) The Xpert Xpress SARS-CoV-2/FLU/RSV plus assay is intended as an aid in the diagnosis of influenza from Nasopharyngeal swab specimens and should not be used as a sole basis for treatment. Nasal washings and aspirates are unacceptable for Xpert Xpress SARS-CoV-2/FLU/RSV testing.  Fact Sheet for Patients: EntrepreneurPulse.com.au  Fact Sheet for Healthcare Providers: IncredibleEmployment.be  This test is not yet approved or cleared by the [place] FDA and has been authorized for detection and/or diagnosis of SARS-CoV-2 by FDA under an Emergency Use Authorization (EUA). This EUA will remain in effect (meaning this test can be used) for the duration of the COVID-19 declaration under Section 564(b)(1) of the Act, 21 U.S.C. section 360bbb-3(b)(1), unless the authorization is terminated or revoked.  Performed at [facility], [address] [place], [place] [address]     Coagulation Studies: No results for input(s): "LABPROT", "INR" in the last 72 hours.   Urinalysis: No results for input(s): "COLORURINE", "LABSPEC",  "PHURINE", "GLUCOSEU", "HGBUR", "BILIRUBINUR", "KETONESUR", "PROTEINUR", "UROBILINOGEN", "NITRITE", "LEUKOCYTESUR" in the last 72 hours.  Invalid input(s): "APPERANCEUR"    Imaging: US RENAL  Result Date: 12/06/2021 CLINICAL DATA:  Abdominal ultrasound 11/08/2021. Abdominopelvic CT 11/29/2020. EXAM: RENAL / URINARY TRACT ULTRASOUND COMPLETE COMPARISON:  None Available. FINDINGS: Right Kidney: Renal measurements: 9.8 x 3.8 x 4.3 cm = volume: 79.5 mL. Echogenicity within normal limits. No mass or hydronephrosis visualized. Left Kidney: Renal measurements: 10.4 x 4.5 x 3.5 cm = volume: 85.4 mL. Small cysts measuring up to 3.2 cm in the lower pole. No evidence of solid renal mass or hydronephrosis. Bladder: Appears normal for degree of bladder distention. Other: Ascites noted. The visualized liver demonstrates contour irregularity, consistent with cirrhosis. IMPRESSION: 1. Both kidneys are similar in size, without hydronephrosis. 2. Hepatic cirrhosis and ascites. Electronically Signed   By: Richardean Sale M.D.   On: 12/06/2021 16:35     Medications:    sodium chloride Stopped (12/03/21 1136)   promethazine (PHENERGAN) injection (IM or IVPB) 12.5 mg (12/05/21 1301)    amiodarone  200 mg Oral Daily   darbepoetin (ARANESP) injection - NON-DIALYSIS  100 mcg Subcutaneous Q Fri-1800   docusate sodium  100 mg Oral BID   enoxaparin (LOVENOX) injection  30 mg Subcutaneous Q24H   lidocaine  2 patch Transdermal Q24H   pantoprazole  40 mg Oral BID  sodium chloride, acetaminophen, diclofenac Sodium, nitroGLYCERIN, ondansetron (ZOFRAN) IV, promethazine (PHENERGAN) injection (IM or IVPB)  Assessment/ Plan:  April [name] is a 81 y.o.  female with past medical history including diastolic CHF, anemia, CAD, paroxysmal atrial fibrillation without anticoagulation.  GI bleed.  Chronic kidney disease stage IV.  Patient reports to ED with complaints of lower extremity edema.  Patient has been admitted for  Acute CHF (congestive heart failure) ([place]) [I50.9] Acute decompensated heart failure ([place]) [I50.9]   Acute Kidney Injury with hyponatremia on chronic kidney disease stage IV with baseline creatinine 1.75 and GFR of 29 on 08/24/21.  Acute kidney injury secondary to volume overload and diuresis.  No IV contrast exposure.  No acute need for dialysis.   Creatinine continues to rise slowly with decent urine output. IVF stopped, appetite appropriate, diuretics held in attempts to allow for renal recovery, this has not been successful. Discussed with patient the need to start temporary dialysis to manage renal function. Patient is agreeable to this plan. Will consult vascular surgery for placement of temp cath. Once placed, will provide initial dialysis treatment.    Lab Results  Component Value Date   CREATININE 3.53 (H) 12/08/2021   CREATININE 3.34 (H) 12/07/2021   CREATININE 3.29 (H) 12/06/2021    Intake/Output Summary (Last 24 hours) at 12/08/2021 1344 Last data filed at 12/08/2021 1147 Gross per 24 hour  Intake 840 ml  Output 700 ml  Net 140 ml    2. Chronic diastolic heart failure. ECHO from 08/10/21 shows EF 60-65% with a Grade II diastolic dysfunction with a mildly mitral valve regurgitation.  Diuretics remain held.    3. Anemia of chronic kidney disease Lab Results  Component Value Date   HGB 7.6 (L) 12/08/2021    Hemoglobin below target, but stable.  4. Hypokalemia. Resolved   LOS: [place] 8/10/20231:44 PM

## 2021-12-08 NOTE — Consult Note (Signed)
Cardiology Consultation:   Patient ID: April [name] MRN: [id number]; DOB: 02/09/1941  Admit date: 11/23/2021 Date of Consult: 12/08/2021  PCP:  Einar Pheasant, MD   [facility] Providers Cardiologist:  Ida Rogue, MD  Electrophysiologist:  Vickie Epley, MD  {   Patient Profile:   April [name] is a 81 y.o. female with a hx of coronary artery disease status post RCA and PDA stenting, HFpEF, hypertension, paroxysmal A-fib, anemia with recurrent GI bleeding, CKD, diabetes type 2 who is being seen 12/08/2021 for the evaluation of CHF at the request of Dr. Loleta Books.  History of Present Illness:   April [name] was admitted to the hospital in 2019 and vomiting with chest discomfort and mild troponin elevation.  Echo showed normal LV function with grade 2 diastolic dysfunction.  Left heart cath showed patent RCA stents and otherwise no targets for intervention.  Patient was admitted to [facility] 07/18/2021 with symptomatic anemia and melena.  Hemoglobin was down to 7.2, requiring 2 units PRBCs, Eliquis was held.  EGD showed mildly erythematous gastric mucosa without obvious bleeding.  Colonoscopy showed no source of bleeding.  Capsule endoscopy was subsequently carried out.  EGD biopsies negative for H. pylori.  She was discharged home off Eliquis.  She was seen by GI 07/21/2021, at which time H&H were stable.  Note indicates that she was cleared to resume anticoagulation.  She was seen in the office 07/26/2021 and was volume up given Lasix was held.  Lasix was increased for 3 days then back down to 40 mg twice daily.  She was restarted on Eliquis.  She was admitted to [facility] April 2023 with recurrent melena and small amount of blood-streaked reflux leading to her Eliquis being held.  She evaluated by GI and underwent enteroscopy and capsule endoscopy which showed a large hiatal hernia, patchy erythematous and petechial mucosa with erosion in the stomach, the duodenum, and jejunum appeared  normal.  GI subsequently cleared patient to resume Eliquis, this was held at discharge and patient request.  She contacted the office on 4 6 concerned about Eliquis, and after discussion with her primary cardiologist it was recommended she resume Eliquis at 2.5 mg twice daily, and hold aspirin.  She was admitted to [facility] 08/08/2021 -4/17/23with acute on chronic HFpEF, recurrent melena, and anemia.  Eliquis was held.  Hemoglobin was 8.2 which was similar to her discharge at [facility].  She was transferred to units and torsemide was started with 40 mg in the a.m. and 20 in the p.m.  Torsemide was later increased.  Patient was again admitted 6/19-6/23 with dyspnea found to have AKI and severe hypokalemia.  Potassium was repleted and she was hydrated.  Was discharged with home health.  She saw Dr. Quentin Ore 11/23/2021 and was off her Eliquis.  She remained on amiodarone.  She reported swelling in her lower extremities.  She had a prescription for metolazone but was not taking it.  Is recommended she go to the hospital for IV diuresis.  The patient presented to the ER 11/23/2021 for increasing peripheral edema.  Chest x-ray was unremarkable.  High sensitive troponins were 18 and 21.  Otherwise labs were largely at baseline.  She was given IV Lasix and admitted.  She was started on Lasix drip, but this was stopped on 8/6 due to worsening AKI, started on IVF.  Nephrology is also following.   Past Medical History:  Diagnosis Date   Anemia    CAD S/P PCI pRCA Promus  DES 3.5 x 16 (4.1 mm), ostRPDA Promus DES 2.5 x 12 (2.7 mm) 03/25/2016   CAD S/P percutaneous coronary angioplasty    a. 07/2015 MV: No ischemia, EF 66%;  b. 03/2016 Inflat STEMI/PCI: LM nl, LAD 25d, RI nl, LCX nl, OM1/2 nl, RCA 80p (3.5x16 Promus Premier DES), 50p/m, 30d, RPDA 90 (2.5x12 Promu Premier DES); c. 01/2018 Cath ([place], [place]): Patent RCA stents->Med Rx.   CHF (congestive heart failure) ([place])    CKD (chronic kidney  disease), stage III ([place])    Diabetes mellitus without complication ([place])    Diastolic dysfunction    a. 10/2013 Echo: EF 55-65%, Gr1 DD; b. 01/2018 Echo ([place], [place]): EF 55-60%, Gr2 DD, RVSP 68mHg.   Diverticulitis    Dyspnea    Endometriosis    Family history of adverse reaction to anesthesia    Mother - had to stay in ICU because of breathing difficulties every time she had anesthesia   GERD (gastroesophageal reflux disease)    GI bleed    a. 06/2021 - melena-->2 u PRBCs.   Hiatal hernia    History of colon polyps 08/1994   History of Migraines    Hypercholesterolemia    Hypertension    LBBB (left bundle branch block)    Osteoporosis    PAF (paroxysmal atrial fibrillation) ([place]    a. 03/2016 Dx @ time of MI; b. CHA2DS2VASc = 5-->Eliquis.   Rheumatic fever    Sleep apnea    CPAP   Spasmodic dysphonia    ST elevation myocardial infarction (STEMI) of inferolateral wall, [place] of [place] 03/25/2016   ST elevation myocardial infarction (STEMI) of inferolateral wall, initial episode of care ([facility] 03/25/2016   Urine incontinence     Past Surgical History:  Procedure Laterality Date   ABDOMINAL HYSTERECTOMY     ovaries not removed   APPENDECTOMY     was removed during hysterectomy   Breast biopsies     x2   BREAST EXCISIONAL BIOPSY Bilateral "years ago"   neg   BREAST SURGERY Bilateral    BROW LIFT Bilateral 08/15/2019   Procedure: BLEPHAROPLASTY UPPER EYELID; W/EXCESS SKIN BLEPHAROPTOSIS REPAIR; RESECT EX;  Surgeon: FKarle Starch MD;  [facility]: [facility]  Service: Ophthalmology;  Laterality: Bilateral;  sleep apnea   CARDIAC CATHETERIZATION  2011   moderate 40% RCA disease   CARDIAC CATHETERIZATION  01/2010   Dr. Gollan'@[facility]'$ : Only noted 40% RCA   CARDIAC CATHETERIZATION N/A 03/25/2016   Procedure: Left Heart Cath and Coronary Angiography;  Surgeon: D12/11/2015 MD;  [facility];  Service: Cardiovascular;   Laterality: N/A;   CARDIAC CATHETERIZATION N/A 03/25/2016   Procedure: Coronary Stent Intervention;  Surgeon: D12/11/2015 MD;  [facility];  Service: Cardiovascular;  Laterality: N/A;   COLONOSCOPY  2013   COLONOSCOPY WITH PROPOFOL N/A 07/11/2021   Procedure: COLONOSCOPY WITH PROPOFOL;  Surgeon: W3/26/2023 MD;  [facility];  Service: Endoscopy;  Laterality: N/A;   ESOPHAGOGASTRODUODENOSCOPY (EGD) WITH PROPOFOL N/A 03/17/2015   Procedure: ESOPHAGOGASTRODUODENOSCOPY (EGD) WITH PROPOFOL;  Surgeon: S11/29/2016 MD;  Location: [facility] ENDOSCOPY;  Service: Gastroenterology;  Laterality: N/A;   ESOPHAGOGASTRODUODENOSCOPY (EGD) WITH PROPOFOL N/A 07/10/2021   Procedure: ESOPHAGOGASTRODUODENOSCOPY (EGD) WITH PROPOFOL;  Surgeon: V16/03/2022 MD;  [facility];  Service: Gastroenterology;  Laterality: N/A;   GIVENS CAPSULE STUDY N/A 07/11/2021   Procedure: GIVENS CAPSULE STUDY;  Surgeon: W3/26/2023  MD;  Location: [facility] ENDOSCOPY;  Service: Endoscopy;  Laterality: N/A;   NM MYOVIEW ([place])  07/2015   No evidence ischemia or infarction. EF 66%. Low risk   TONSILLECTOMY     TRANSTHORACIC ECHOCARDIOGRAM  10/2013   Normal LV size and function. EF 55-65%. GR 1 DD. Otherwise normal.     Home Medications:  Prior to Admission medications   Medication Sig Start Date End Date Taking? Authorizing Provider  albuterol (PROVENTIL HFA;VENTOLIN HFA) 108 (90 Base) MCG/ACT inhaler Inhale 2 puffs into the lungs every 6 (six) hours as needed for wheezing or shortness of breath. 04/22/18  Yes Kasa, Maretta Bees, MD  amiodarone (PACERONE) 200 MG tablet TAKE 1 TABLET BY MOUTH DAILY 09/27/21  Yes Gollan, Kathlene November, MD  cholecalciferol (VITAMIN D3) 25 MCG (1000 UNIT) tablet Take 1,000 Units by mouth 2 (two) times daily.   Yes [provider]  Docusate Calcium (STOOL SOFTENER PO) Take 1 tablet by mouth 2 (two) times daily.   Yes [provider]   Glucosamine-Chondroit-Collagen [phone] MG CAPS Take 1 tablet by mouth in the morning and at bedtime.   Yes [provider]  potassium chloride SA (KLOR-CON M) 20 MEQ tablet TAKE 1 TABLET BY MOUTH TWICE A DAY. 10/31/21  Yes Einar Pheasant, MD  torsemide 40 MG TABS Take 40 mg by mouth daily as needed (for 3 pound weight gain with extra potassium 40 meq). 10/21/21  Yes Jennye Boroughs, MD  acetaminophen (TYLENOL) 500 MG tablet Take 500 mg by mouth daily as needed for headache (pain).    [provider]  fluticasone-salmeterol (ADVAIR HFA) 230-21 MCG/ACT inhaler Inhale 2 puffs into the lungs 2 (two) times daily. 10/20/20   Flora Lipps, MD  metolazone (ZAROXOLYN) 2.5 MG tablet Take 1 tablet (2.5 mg total) by mouth daily as needed (Metolazone with extra 40 potassium preserved for refractory weight gain (thus only if there is no significant improovement with extra Torsemide)). Patient not taking: Reported on 11/23/2021 10/21/21   Jennye Boroughs, MD  mupirocin ointment (BACTROBAN) 2 % Apply 1 application. topically 2 (two) times daily. Patient not taking: Reported on 11/23/2021    [provider]  nitroGLYCERIN (NITROSTAT) 0.4 MG SL tablet Place 1 tablet (0.4 mg total) under the tongue every 5 (five) minutes as needed. 04/22/18   Minna Merritts, MD  Polyethyl Glycol-Propyl Glycol (SYSTANE OP) Place 1 drop into both eyes daily as needed (dry eyes).    [provider]  Propylene Glycol (SYSTANE COMPLETE) 0.6 % SOLN Apply 1 drop to eye daily.    [provider]  torsemide (DEMADEX) 20 MG tablet Take 2 tablets (40 mg total) by mouth daily. Patient not taking: Reported on 11/23/2021 09/08/21   Antony Madura, PA-C    Inpatient Medications: Scheduled Meds:  amiodarone  200 mg Oral Daily   darbepoetin (ARANESP) injection - NON-DIALYSIS  100 mcg Subcutaneous Q Fri-1800   docusate sodium  100 mg Oral BID   enoxaparin (LOVENOX) injection  30 mg Subcutaneous Q24H    lidocaine  2 patch Transdermal Q24H   pantoprazole  40 mg Oral BID   Continuous Infusions:  sodium chloride Stopped (12/03/21 1136)   promethazine (PHENERGAN) injection (IM or IVPB) 12.5 mg (12/05/21 1301)   PRN Meds: sodium chloride, acetaminophen, diclofenac Sodium, nitroGLYCERIN, ondansetron (ZOFRAN) IV, promethazine (PHENERGAN) injection (IM or IVPB)  Allergies:    Allergies  Allergen Reactions   Penicillins Other (See Comments)    Okay to take amoxicillin/(pt does not  recall what the reaction to penicillin was (92-8 years old)    Sulfa Antibiotics Rash    Social History:   Social History   Socioeconomic History   Marital status: Widowed    Spouse name: Not on file   Number of children: 2   Years of education: Not on file   Highest education level: Not on file  Occupational History   Not on file  Tobacco Use   Smoking status: Never   Smokeless tobacco: Never  Vaping Use   Vaping Use: Never used  Substance and Sexual Activity   Alcohol use: No    Alcohol/week: 0.0 standard drinks of alcohol   Drug use: No   Sexual activity: Not Currently    Birth control/protection: Post-menopausal  Other Topics Concern   Not on file  Social History Narrative   Not on file   Social Determinants of Health   Financial Resource Strain: Not on file  Food Insecurity: Not on file  Transportation Needs: Not on file  Physical Activity: Not on file  Stress: Not on file  Social Connections: Not on file  Intimate Partner Violence: Not on file    Family History:    Family History  Problem Relation Age of Onset   Arthritis Mother    Stroke Mother    Hypertension Mother    Osteoporosis Mother    Heart disease Father        MI   Heart attack Father    Stroke Brother    Heart attack Sister    Breast cancer Other        first cousin x 2   Stroke Daughter    Heart attack Daughter    Colon cancer Neg Hx      ROS:  Please see the history of present illness.   All  other ROS reviewed and negative.     Physical Exam/Data:   Vitals:   12/07/21 2337 12/08/21 0451 12/08/21 0455 12/08/21 0747  BP: (!) 113/46  (!) 122/50 112/61  Pulse: 70  65 63  Resp: '18  18 18  '$ Temp: 98.1 F (36.7 C)  98.3 F (36.8 C) (!) 97.5 F (36.4 C)  TempSrc:      SpO2: 98%  97% 97%  Weight:  88.7 kg    Height:        Intake/Output Summary (Last 24 hours) at 12/08/2021 0811 Last data filed at 12/07/2021 1848 Gross per 24 hour  Intake 1080 ml  Output 1075 ml  Net 5 ml      12/08/2021    4:51 AM 12/07/2021    4:07 AM 12/06/2021    4:39 AM  Last 3 Weights  Weight (lbs) 195 lb 8.8 oz 186 lb 4.6 oz 188 lb 15 oz  Weight (kg) 88.7 kg 84.5 kg 85.7 kg     Body mass index is 38.19 kg/m.  General:  Well nourished, well developed, in no acute distress HEENT: normal Neck: no JVD Vascular: No carotid bruits; Distal pulses 2+ bilaterally Cardiac:  normal S1, S2; RRR; no murmur  Lungs:  clear to auscultation bilaterally, no wheezing, rhonchi or rales  Abd: soft, nontender, no hepatomegaly  Ext: mild lower leg edema Musculoskeletal:  No deformities, BUE and BLE strength normal and equal Skin: warm and dry  Neuro:  CNs 2-12 intact, no focal abnormalities noted Psych:  Normal affect   EKG:  The EKG was personally reviewed and demonstrates:  NO new Telemetry:  Telemetry was personally reviewed and  demonstrates:  NSR, 1st degree AV block, PVCs, HR 60s  Relevant CV Studies:  Echocardiogram completed 08/10/2021 1. Left ventricular ejection fraction, by estimation, is 60 to 65%. The  left ventricle has normal function. The left ventricle has no regional  wall motion abnormalities. Left ventricular diastolic parameters are  consistent with Grade II diastolic  dysfunction (pseudonormalization).   2. Right ventricular systolic function is normal. The right ventricular  size is mildly enlarged. There is mildly elevated pulmonary artery  systolic pressure. The estimated right  ventricular systolic pressure is  32.6 mmHg.   3. Left atrial size was mildly dilated.   4. Right atrial size was mildly dilated.   5. The mitral valve is normal in structure. Mild mitral valve  regurgitation.   6. Tricuspid valve regurgitation is moderate.   7. The aortic valve is grossly normal. Aortic valve regurgitation is not  visualized.   8. The inferior vena cava is normal in size with greater than 50%  respiratory variability, suggesting right atrial pressure of 3 mmHg.   LHC 03/2016 Prox RCA lesion, 80 %stenosed. A STENT PROMUS PREM MR 3.5X16 (3.8-4.1 mm) drug eluting stent was successfully placed. Post intervention, there is a 0% residual stenosis. Ost RPDA lesion, 90 %stenosed. A STENT PROMUS PREM MR 2.5X12 (2.7 mm) drug eluting stent was successfully placed. Post intervention, there is a 0% residual stenosis. __________________________________ Prox RCA to Mid RCA lesion, 50 %stenosed. Dist RCA lesion, 30 %stenosed.- Appears smooth and chronic Dist LAD lesion, 25 %stenosed - diffuse disease in the distal LAD. Nonsignificant The left ventricular systolic function is normal. The left ventricular ejection fraction is 55-65% by visual estimate. There is no mitral valve regurgitation. There is no aortic valve stenosis.   Thankfully, the patient presented chest pain-free indicating likely resolution of 100% occluded vessel. This was found to be the case with a thrombotic 80% proximal RCA as well as a very focal 90% ostial PDA lesion. Both these were treated with DES stents as non-Primary PCI. The digital mid RCA 50% stenosis was relatively smooth and appears to be chronic. This will be treated medically.   Preserved EF with mildly elevated LVEDP. Severe systemic hypertension noted.   Plan: Admit to TCU, step down cardiac unit for post MI/PCI care. TR band removal per protocol Continue Aggrastat infusion until current bag complete Aspirin plus Brilinta for minimum 3 months. At  which time could consider stopping aspirin. Restart home dose of carvedilol and losartan.  As she is on furosemide, would hold HCTZ. Increase home Crestor dose to 40 mg.   Expect Fast-Track Discharge    Coronary Diagrams   Diagnostic Dominance: Right Intervention        Laboratory Data:  High Sensitivity Troponin:   Recent Labs  Lab 11/23/21 1514 11/23/21 1714  TROPONINIHS 18* 21*     Chemistry Recent Labs  Lab 12/06/21 0431 12/07/21 0444 12/08/21 0659  NA 133* 131* 128*  K 4.5 4.5 4.4  CL 100 101 97*  CO2 '24 24 24  '$ GLUCOSE 86 87 92  BUN 51* 53* 53*  CREATININE 3.29* 3.34* 3.53*  CALCIUM 8.4* 8.1* 8.2*  MG 2.2 2.2 2.4  GFRNONAA 13* 13* 12*  ANIONGAP '9 6 7    '$ No results for input(s): "PROT", "ALBUMIN", "AST", "ALT", "ALKPHOS", "BILITOT" in the last 168 hours. Lipids No results for input(s): "CHOL", "TRIG", "HDL", "LABVLDL", "LDLCALC", "CHOLHDL" in the last 168 hours.  Hematology Recent Labs  Lab 12/06/21 7124 12/07/21 0444 12/08/21 5809  WBC 10.0 8.6 9.2  RBC 2.57* 2.55* 2.49*  HGB 7.7* 7.6* 7.6*  HCT 22.8* 22.2* 22.0*  MCV 88.7 87.1 88.4  MCH 30.0 29.8 30.5  MCHC 33.8 34.2 34.5  RDW 20.1* 19.6* 19.8*  PLT 194 193 199   Thyroid No results for input(s): "TSH", "FREET4" in the last 168 hours.  BNPNo results for input(s): "BNP", "PROBNP" in the last 168 hours.  DDimer No results for input(s): "DDIMER" in the last 168 hours.   Radiology/Studies:  US RENAL  Result Date: 2021/12/18 CLINICAL DATA:  Abdominal ultrasound 11/08/2021. Abdominopelvic CT 11/29/2020. EXAM: RENAL / URINARY TRACT ULTRASOUND COMPLETE COMPARISON:  None Available. FINDINGS: Right Kidney: Renal measurements: 9.8 x 3.8 x 4.3 cm = volume: 79.5 mL. Echogenicity within normal limits. No mass or hydronephrosis visualized. Left Kidney: Renal measurements: 10.4 x 4.5 x 3.5 cm = volume: 85.4 mL. Small cysts measuring up to 3.2 cm in the lower pole. No evidence of solid renal mass or  hydronephrosis. Bladder: Appears normal for degree of bladder distention. Other: Ascites noted. The visualized liver demonstrates contour irregularity, consistent with cirrhosis. IMPRESSION: 1. Both kidneys are similar in size, without hydronephrosis. 2. Hepatic cirrhosis and ascites. Electronically Signed   By: Richardean Sale M.D.   On: 12/18/21 16:35     Assessment and Plan:   Acute on chronic diastolic heart failure - Echo in April showed LVEF 60-65%, G2DD - torsemide was increased as an OP before admission - on admission BNP 368, CXR was non-acute and was admitted for IV lasix - lasix drip stopped 8/6 for worsening Scr - nephrology following, s/p IVF. Scr/Bun still up today - echo ordered - Net -2.8L - continue to hold diuretics and monitor kidney function - complicated situation, discussing RHC to assess volume  CAD s/p prior PCI - no anginal symptoms reported - no further ischemic work-up  Hypokalemia Hyponatremia - hypokalemia resolved, however NA is low at 128  AKI on CKD stage 4 - on admission 7/26 Scr 2.01, BUN 32 - worsening Scr during admission, lasix was stopped and given IVF - kidney function worse today>Scr 3.25/BUN 53  - renal US ordered - nephrology following, no need for dialysis  Paroxysmal Afib - remains in NSR - not on a/c due to GIB/anemia - continue amiodarone  HTN - Bps good    For questions or updates, please contact [facility] Please consult [email or web address] for contact info under    Signed, Kyesha Balla Ninfa Meeker, PA-C  12/08/2021 8:11 AM

## 2021-12-08 NOTE — Progress Notes (Signed)
Mobility Specialist - Progress Note    12/08/21 1300  Mobility  Activity Ambulated with assistance in hallway  Level of Assistance Contact guard assist, steadying assist  Assistive Device Front wheel walker  Distance Ambulated (ft) 160 ft  Activity Response Tolerated well  $Mobility charge 1 Mobility   Pt sitting in chair on RA upon arrival. Pt STS SBA. Pt ambulates 1 lap around NS CGA with 1 LOB corrected by CGA. Pt returns to chair with needs in reach.   Gretchen Short  Mobility Specialist  12/08/21 1:04 PM

## 2021-12-08 NOTE — TOC Progression Note (Signed)
Transition of Care [facility]) - Progression Note    Patient Details  Name: April [name] MRN: [id number] Date of Birth: 1940/11/05  Transition of Care [facility]) CM/SW [place], [place] Phone Number: 12/08/2021, 9:28 AM  Clinical Narrative:     TOC continues to follow for medical readiness to return to [facility] with [facility]  No DME needs.    Expected Discharge Plan: [place] Barriers to Discharge: Continued Medical Work up  Expected Discharge Plan and Services Expected Discharge Plan: [place] Choice: Resumption of Svcs/PTA Provider Living arrangements for the past 2 months: [place] Arranged: RN, PT, OT [facility] Agency: [place]if [place] ([facility]) Date HH Agency Contacted: 11/28/21   Representative spoke with at [place]: Floydene Flock   Social Determinants of Health (SDOH) Interventions    Readmission Risk Interventions    11/28/2021   11:17 AM 10/19/2021   12:27 PM  Readmission Risk Prevention Plan  Transportation Screening Complete Complete  PCP or Specialist Appt within 3-5 Days Complete Complete  HRI or [place] Complete Complete  Social Work Consult for [place] Planning/Counseling Complete Complete  Palliative Care Screening Not Applicable Complete  Medication Review Press photographer) Complete Complete

## 2021-12-09 ENCOUNTER — Inpatient Hospital Stay: Payer: Medicare Other

## 2021-12-09 ENCOUNTER — Encounter: Payer: Self-pay | Admitting: Vascular Surgery

## 2021-12-09 DIAGNOSIS — N184 Chronic kidney disease, stage 4 (severe): Secondary | ICD-10-CM | POA: Diagnosis not present

## 2021-12-09 DIAGNOSIS — R188 Other ascites: Secondary | ICD-10-CM

## 2021-12-09 DIAGNOSIS — K7460 Unspecified cirrhosis of liver: Secondary | ICD-10-CM

## 2021-12-09 DIAGNOSIS — I2510 Atherosclerotic heart disease of native coronary artery without angina pectoris: Secondary | ICD-10-CM | POA: Diagnosis not present

## 2021-12-09 DIAGNOSIS — N170 Acute kidney failure with tubular necrosis: Secondary | ICD-10-CM | POA: Diagnosis not present

## 2021-12-09 DIAGNOSIS — G9341 Metabolic encephalopathy: Secondary | ICD-10-CM

## 2021-12-09 DIAGNOSIS — K7682 Hepatic encephalopathy: Secondary | ICD-10-CM

## 2021-12-09 DIAGNOSIS — Z9861 Coronary angioplasty status: Secondary | ICD-10-CM | POA: Diagnosis not present

## 2021-12-09 DIAGNOSIS — I5032 Chronic diastolic (congestive) heart failure: Secondary | ICD-10-CM | POA: Diagnosis not present

## 2021-12-09 LAB — GLUCOSE, CAPILLARY: Glucose-Capillary: 118 mg/dL — ABNORMAL HIGH (ref 70–99)

## 2021-12-09 LAB — BASIC METABOLIC PANEL
Anion gap: 8 (ref 5–15)
BUN: 54 mg/dL — ABNORMAL HIGH (ref 8–23)
CO2: 23 mmol/L (ref 22–32)
Calcium: 8.7 mg/dL — ABNORMAL LOW (ref 8.9–10.3)
Chloride: 98 mmol/L (ref 98–111)
Creatinine, Ser: 3.16 mg/dL — ABNORMAL HIGH (ref 0.44–1.00)
GFR, Estimated: 14 mL/min — ABNORMAL LOW (ref >60)
Glucose, Bld: 110 mg/dL — ABNORMAL HIGH (ref 70–99)
Potassium: 4.7 mmol/L (ref 3.5–5.1)
Sodium: 129 mmol/L — ABNORMAL LOW (ref 135–145)

## 2021-12-09 LAB — CBC
HCT: 24.9 % — ABNORMAL LOW (ref 36.0–46.0)
Hemoglobin: 8.6 g/dL — ABNORMAL LOW (ref 12.0–15.0)
MCH: 29.9 pg (ref 26.0–34.0)
MCHC: 34.5 g/dL (ref 30.0–36.0)
MCV: 86.5 fL (ref 80.0–100.0)
Platelets: 213 10*3/uL (ref 150–400)
RBC: 2.88 MIL/uL — ABNORMAL LOW (ref 3.87–5.11)
RDW: 19.4 % — ABNORMAL HIGH (ref 11.5–15.5)
WBC: 12.4 10*3/uL — ABNORMAL HIGH (ref 4.0–10.5)
nRBC: 0 % (ref 0.0–0.2)

## 2021-12-09 LAB — AMMONIA: Ammonia: 159 umol/L — ABNORMAL HIGH (ref 9–35)

## 2021-12-09 LAB — HEPATITIS B CORE ANTIBODY, TOTAL
Hep B Core Total Ab: NONREACTIVE
Hep B Core Total Ab: NONREACTIVE

## 2021-12-09 LAB — HEPATITIS B SURFACE ANTIGEN
Hepatitis B Surface Ag: NONREACTIVE
Hepatitis B Surface Ag: NONREACTIVE

## 2021-12-09 LAB — MAGNESIUM: Magnesium: 2.4 mg/dL (ref 1.7–2.4)

## 2021-12-09 LAB — HEPATITIS B CORE ANTIBODY, IGM: Hep B C IgM: NONREACTIVE

## 2021-12-09 LAB — LACTIC ACID, PLASMA: Lactic Acid, Venous: 2.7 mmol/L (ref 0.5–1.9)

## 2021-12-09 MED ORDER — HEPARIN SODIUM (PORCINE) 1000 UNIT/ML DIALYSIS
1000.0000 [IU] | INTRAMUSCULAR | Status: DC | PRN
  Filled 2021-12-09: qty 1

## 2021-12-09 MED ORDER — PENTAFLUOROPROP-TETRAFLUOROETH EX AERO: 1.0000 | INHALATION_SPRAY | CUTANEOUS | Status: DC | PRN

## 2021-12-09 MED ORDER — LIDOCAINE-PRILOCAINE 2.5-2.5 % EX CREA
1.0000 | TOPICAL_CREAM | CUTANEOUS | Status: DC | PRN
  Filled 2021-12-09: qty 5

## 2021-12-09 MED ORDER — LIDOCAINE HCL (PF) 1 % IJ SOLN
5.0000 mL | INTRAMUSCULAR | Status: DC | PRN
  Filled 2021-12-09: qty 5

## 2021-12-09 MED ORDER — ALTEPLASE 2 MG IJ SOLR: 2.0000 mg | Freq: Once | INTRAMUSCULAR | Status: DC | PRN

## 2021-12-09 MED ORDER — LACTULOSE 10 GM/15ML PO SOLN: 30.0000 g | Freq: Once | ORAL | Status: DC

## 2021-12-09 MED ORDER — HEPARIN SODIUM (PORCINE) 5000 UNIT/ML IJ SOLN
5000.0000 [IU] | Freq: Three times a day (TID) | INTRAMUSCULAR | Status: DC
  Administered 2021-12-09 – 2021-12-17 (×22): 5000 [IU] via SUBCUTANEOUS
  Filled 2021-12-09 (×22): qty 1

## 2021-12-09 MED ORDER — HEPARIN SODIUM (PORCINE) 1000 UNIT/ML IJ SOLN
INTRAMUSCULAR | Status: AC
  Filled 2021-12-09: qty 10

## 2021-12-09 MED ORDER — RIFAXIMIN 200 MG PO TABS
200.0000 mg | ORAL_TABLET | Freq: Two times a day (BID) | ORAL | Status: DC
  Administered 2021-12-09 – 2021-12-19 (×20): 200 mg via ORAL
  Filled 2021-12-09 (×22): qty 1

## 2021-12-09 MED ORDER — LACTULOSE 10 GM/15ML PO SOLN
30.0000 g | Freq: Three times a day (TID) | ORAL | Status: DC
  Administered 2021-12-09 – 2021-12-10 (×4): 30 g via ORAL
  Filled 2021-12-09 (×4): qty 60

## 2021-12-09 MED ORDER — ANTICOAGULANT SODIUM CITRATE 4% (200MG/5ML) IV SOLN
5.0000 mL | Status: DC | PRN
  Filled 2021-12-09: qty 5

## 2021-12-09 NOTE — Progress Notes (Signed)
OT Cancellation Note  Patient Details Name: April [name] MRN: [id number] DOB: December 31, 1940   Cancelled Treatment:    Reason Eval/Treat Not Completed: Other (comment). Spoke with RN via phone. RN giving pt medication, pt confused this morning. RN agreeable to OT re-attempting this afternoon as appropriate.   Ardeth Perfect., MPH, MS, OTR/L ascom [phone] 12/09/21, 10:23 AM

## 2021-12-09 NOTE — Plan of Care (Signed)
  Problem: Education: Goal: Knowledge of General Education information will improve Description Including pain rating scale, medication(s)/side effects and non-pharmacologic comfort measures Outcome: Progressing   

## 2021-12-09 NOTE — Assessment & Plan Note (Signed)
Due to hepatic encephalopathy

## 2021-12-09 NOTE — Progress Notes (Signed)
Pt HD end w/ no complications. Report to primary RN. Start 1427 End 1627 0 fluid removed 24L BVP

## 2021-12-09 NOTE — Progress Notes (Signed)
Progress Note  Patient Name: April [name] Date of Encounter: 12/09/2021  [facility] Cardiologist: Ida Rogue, MD   Subjective   Plan to initiate dialysis treatment per nephrology. She is overall not feeling to well. Plan for first session today.   Inpatient Medications    Scheduled Meds:  amiodarone  200 mg Oral Daily   Chlorhexidine Gluconate Cloth  6 each Topical Q0600   darbepoetin (ARANESP) injection - NON-DIALYSIS  100 mcg Subcutaneous Q Fri-1800   docusate sodium  100 mg Oral BID   enoxaparin (LOVENOX) injection  30 mg Subcutaneous Q24H   lidocaine  2 patch Transdermal Q24H   pantoprazole  40 mg Oral BID   Continuous Infusions:  sodium chloride Stopped (12/03/21 1136)   anticoagulant sodium citrate     ferric gluconate (FERRLECIT) IVPB 125 mg (12/08/21 1740)   promethazine (PHENERGAN) injection (IM or IVPB) 12.5 mg (12/05/21 1301)   PRN Meds: sodium chloride, acetaminophen, alteplase, anticoagulant sodium citrate, diclofenac Sodium, heparin, lidocaine (PF), lidocaine-prilocaine, nitroGLYCERIN, ondansetron (ZOFRAN) IV, pentafluoroprop-tetrafluoroeth, promethazine (PHENERGAN) injection (IM or IVPB)   Vital Signs    Vitals:   12/09/21 0117 12/09/21 0418 12/09/21 0651 12/09/21 0820  BP: (!) 118/46 (!) 119/49  (!) 103/90  Pulse: 71 62  71  Resp: '19 18  16  '$ Temp: 98.7 F (37.1 C) 98 F (36.7 C)  97.7 F (36.5 C)  TempSrc: Oral   Oral  SpO2: 95% 98%  97%  Weight:   87.5 kg   Height:        Intake/Output Summary (Last 24 hours) at 12/09/2021 0915 Last data filed at 12/08/2021 1907 Gross per 24 hour  Intake 240 ml  Output 200 ml  Net 40 ml      12/09/2021    6:51 AM 12/08/2021    4:51 AM 12/07/2021    4:07 AM  Last 3 Weights  Weight (lbs) 192 lb 14.4 oz 195 lb 8.8 oz 186 lb 4.6 oz  Weight (kg) 87.5 kg 88.7 kg 84.5 kg      Telemetry    NSR 1st degree blok, HR 60s - Personally Reviewed  ECG    No new - Personally Reviewed  Physical Exam    GEN: No acute distress.   Neck: No JVD Cardiac: RRR, no murmurs, rubs, or gallops.  Respiratory: Clear to auscultation bilaterally. GI: Soft, nontender, non-distended  MS: lower leg edema; No deformity. Neuro:  Nonfocal  Psych: Normal affect   Labs    High Sensitivity Troponin:   Recent Labs  Lab 11/23/21 1514 11/23/21 1714  TROPONINIHS 18* 21*     Chemistry Recent Labs  Lab 12/07/21 0444 12/08/21 0659 12/09/21 0638  NA 131* 128* 129*  K 4.5 4.4 4.7  CL 101 97* 98  CO2 '24 24 23  '$ GLUCOSE 87 92 110*  BUN 53* 53* 54*  CREATININE 3.34* 3.53* 3.16*  CALCIUM 8.1* 8.2* 8.7*  MG 2.2 2.4 2.4  GFRNONAA 13* 12* 14*  ANIONGAP '6 7 8    '$ Lipids No results for input(s): "CHOL", "TRIG", "HDL", "LABVLDL", "LDLCALC", "CHOLHDL" in the last 168 hours.  Hematology Recent Labs  Lab 12/07/21 0444 12/08/21 0659 12/09/21 0638  WBC 8.6 9.2 12.4*  RBC 2.55* 2.49* 2.88*  HGB 7.6* 7.6* 8.6*  HCT 22.2* 22.0* 24.9*  MCV 87.1 88.4 86.5  MCH 29.8 30.5 29.9  MCHC 34.2 34.5 34.5  RDW 19.6* 19.8* 19.4*  PLT 193 199 213   Thyroid No results for input(s): "TSH", "FREET4"  in the last 168 hours.  BNPNo results for input(s): "BNP", "PROBNP" in the last 168 hours.  DDimer No results for input(s): "DDIMER" in the last 168 hours.   Radiology    ECHOCARDIOGRAM COMPLETE  Result Date: 12/08/2021    ECHOCARDIOGRAM REPORT   Patient Name:   April [name] Date of Exam: 12/08/2021 Medical Rec #:  [id number]       Height:       60.0 in Accession #:    [id number]      Weight:       195.5 lb Date of Birth:  [birth year]       BSA:          1.849 m Patient Age:    81 years        BP:           112/61 mmHg Patient Gender: F               HR:           63 bpm. Exam Location:  [facility] Procedure: 2D Echo, Cardiac Doppler and Color Doppler Indications:     Cardiomyopathy --Unspecified I 42.9  History:         Patient has prior history of Echocardiogram examinations, most                  recent 08/10/2021. CHF,  Arrythmias:LBBB; Risk Factors:Diabetes.                  STEMI.  Sonographer:     Sherrie Sport Referring Phys:  [id number] CHRISTOPHER P DANFORD Diagnosing Phys: Kathlyn Sacramento MD IMPRESSIONS  1. Left ventricular ejection fraction, by estimation, is 55 to 60%. The left ventricle has normal function. The left ventricle has no regional wall motion abnormalities. Left ventricular diastolic parameters are consistent with Grade II diastolic dysfunction (pseudonormalization).  2. Right ventricular systolic function is normal. The right ventricular size is normal. There is mildly elevated pulmonary artery systolic pressure. The estimated right ventricular systolic pressure is 56.4 mmHg.  3. Left atrial size was mildly dilated.  4. The mitral valve is normal in structure. No evidence of mitral valve regurgitation. No evidence of mitral stenosis.  5. The aortic valve is normal in structure. Aortic valve regurgitation is not visualized. Aortic valve sclerosis is present, with no evidence of aortic valve stenosis. FINDINGS  Left Ventricle: Left ventricular ejection fraction, by estimation, is 55 to 60%. The left ventricle has normal function. The left ventricle has no regional wall motion abnormalities. The left ventricular internal cavity size was normal in size. There is  borderline left ventricular hypertrophy. Left ventricular diastolic parameters are consistent with Grade II diastolic dysfunction (pseudonormalization). Right Ventricle: The right ventricular size is normal. No increase in right ventricular wall thickness. Right ventricular systolic function is normal. There is mildly elevated pulmonary artery systolic pressure. The tricuspid regurgitant velocity is 3.01  m/s, and with an assumed right atrial pressure of 5 mmHg, the estimated right ventricular systolic pressure is 33.2 mmHg. Left Atrium: Left atrial size was mildly dilated. Right Atrium: Right atrial size was normal in size. Pericardium: There is no evidence of  pericardial effusion. Mitral Valve: The mitral valve is normal in structure. No evidence of mitral valve regurgitation. No evidence of mitral valve stenosis. Tricuspid Valve: The tricuspid valve is normal in structure. Tricuspid valve regurgitation is mild . No evidence of tricuspid stenosis. Aortic Valve: The aortic valve is normal in structure. Aortic valve regurgitation is  not visualized. Aortic valve sclerosis is present, with no evidence of aortic valve stenosis. Aortic valve mean gradient measures 9.3 mmHg. Aortic valve peak gradient measures 16.4 mmHg. Aortic valve area, by VTI measures 0.83 cm. Pulmonic Valve: The pulmonic valve was normal in structure. Pulmonic valve regurgitation is not visualized. No evidence of pulmonic stenosis. Aorta: The aortic root is normal in size and structure. Venous: The inferior vena cava was not well visualized. IAS/Shunts: No atrial level shunt detected by color flow Doppler.  LEFT VENTRICLE PLAX 2D LVIDd:         4.60 cm   Diastology LVIDs:         3.20 cm   LV e' medial:    4.13 cm/s LV PW:         1.00 cm   LV E/e' medial:  23.8 LV IVS:        1.00 cm   LV e' lateral:   7.62 cm/s LVOT diam:     1.25 cm   LV E/e' lateral: 12.9 LV SV:         39 LV SV Index:   21 LVOT Area:     1.23 cm  RIGHT VENTRICLE RV Basal diam:  4.30 cm RV S prime:     14.10 cm/s TAPSE (M-mode): 3.3 cm LEFT ATRIUM             Index        RIGHT ATRIUM           Index LA diam:        4.10 cm 2.22 cm/m   RA Area:     21.00 cm LA Vol (A2C):   81.9 ml 44.30 ml/m  RA Volume:   59.50 ml  32.18 ml/m LA Vol (A4C):   79.4 ml 42.95 ml/m LA Biplane Vol: 86.6 ml 46.84 ml/m  AORTIC VALVE AV Area (Vmax):    0.69 cm AV Area (Vmean):   0.78 cm AV Area (VTI):     0.83 cm AV Vmax:           202.67 cm/s AV Vmean:          142.667 cm/s AV VTI:            0.465 m AV Peak Grad:      16.4 mmHg AV Mean Grad:      9.3 mmHg LVOT Vmax:         114.00 cm/s LVOT Vmean:        90.900 cm/s LVOT VTI:          0.314 m  LVOT/AV VTI ratio: 0.67  AORTA Ao Root diam: 2.63 cm MITRAL VALVE               TRICUSPID VALVE MV Area (PHT): 3.27 cm    TR Peak grad:   36.2 mmHg MV Decel Time: 232 msec    TR Vmax:        301.00 cm/s MV E velocity: 98.50 cm/s MV A velocity: 99.00 cm/s  SHUNTS MV E/A ratio:  0.99        Systemic VTI:  0.31 m                            Systemic Diam: 1.25 cm Kathlyn Sacramento MD Electronically signed by Kathlyn Sacramento MD Signature Date/Time: 12/08/2021/5:41:58 PM    Final    PERIPHERAL VASCULAR CATHETERIZATION  Result Date: 12/08/2021 See surgical note for result.  Cardiac Studies   Echocardiogram completed 08/10/2021 1. Left ventricular ejection fraction, by estimation, is 60 to 65%. The  left ventricle has normal function. The left ventricle has no regional  wall motion abnormalities. Left ventricular diastolic parameters are  consistent with Grade II diastolic  dysfunction (pseudonormalization).   2. Right ventricular systolic function is normal. The right ventricular  size is mildly enlarged. There is mildly elevated pulmonary artery  systolic pressure. The estimated right ventricular systolic pressure is  00.3 mmHg.   3. Left atrial size was mildly dilated.   4. Right atrial size was mildly dilated.   5. The mitral valve is normal in structure. Mild mitral valve  regurgitation.   6. Tricuspid valve regurgitation is moderate.   7. The aortic valve is grossly normal. Aortic valve regurgitation is not  visualized.   8. The inferior vena cava is normal in size with greater than 50%  respiratory variability, suggesting right atrial pressure of 3 mmHg.    LHC 03/2016 Prox RCA lesion, 80 %stenosed. A STENT PROMUS PREM MR 3.5X16 (3.8-4.1 mm) drug eluting stent was successfully placed. Post intervention, there is a 0% residual stenosis. Ost RPDA lesion, 90 %stenosed. A STENT PROMUS PREM MR 2.5X12 (2.7 mm) drug eluting stent was successfully placed. Post intervention, there is a 0% residual  stenosis. __________________________________ Prox RCA to Mid RCA lesion, 50 %stenosed. Dist RCA lesion, 30 %stenosed.- Appears smooth and chronic Dist LAD lesion, 25 %stenosed - diffuse disease in the distal LAD. Nonsignificant The left ventricular systolic function is normal. The left ventricular ejection fraction is 55-65% by visual estimate. There is no mitral valve regurgitation. There is no aortic valve stenosis.   Thankfully, the patient presented chest pain-free indicating likely resolution of 100% occluded vessel. This was found to be the case with a thrombotic 80% proximal RCA as well as a very focal 90% ostial PDA lesion. Both these were treated with DES stents as non-Primary PCI. The digital mid RCA 50% stenosis was relatively smooth and appears to be chronic. This will be treated medically.   Preserved EF with mildly elevated LVEDP. Severe systemic hypertension noted.   Plan: Admit to TCU, step down cardiac unit for post MI/PCI care. TR band removal per protocol Continue Aggrastat infusion until current bag complete Aspirin plus Brilinta for minimum 3 months. At which time could consider stopping aspirin. Restart home dose of carvedilol and losartan.  As she is on furosemide, would hold HCTZ. Increase home Crestor dose to 40 mg.   Expect Fast-Track Discharge    Coronary Diagrams   Diagnostic Dominance: Right Intervention        Patient Profile     81 y.o. female  with a hx of coronary artery disease status post RCA and PDA stenting, HFpEF, hypertension, paroxysmal A-fib, anemia with recurrent GI bleeding, CKD, diabetes type 2 who is being seen 12/08/2021 for the evaluation of CHF  Assessment & Plan    Acute on chronic diastolic heart failure - Echo in April showed LVEF 60-65%, G2DD - torsemide was increased as an OP before admission - on admission BNP 368, CXR was non-acute and was admitted for IV lasix - lasix drip stopped 8/6 for worsening Scr - s/p IVF -  echo ordered - Net -2.5L - plan for initiation of dialysis   CAD s/p prior PCI - no anginal symptoms reported - no further ischemic work-up   Hypokalemia Hyponatremia - hypokalemia resolved, however NA is low at 129   AKI on CKD  stage 4 - on admission 7/26 Scr 2.01, BUN 32 - worsening Scr during admission, lasix was stopped and given IVF - kidney function not improving and with hyponatremia - plan for dialysis as above   Paroxysmal Afib - remains in NSR - not on a/c due to GIB/anemia - continue amiodarone   HTN - Bps good  For questions or updates, please contact [facility] Please consult [email or web address] for contact info under        Signed, Jonaya Freshour Ninfa Meeker, PA-C  12/09/2021, 9:15 AM

## 2021-12-09 NOTE — Progress Notes (Signed)
Date and time results received: 12/09/21 1035 (use smartphrase ".now" to insert current time)  Test: Lab call Lactic acid critical 2.7 Critical Value: Lab call Lactic acid critical 2.7  Name of Provider Notified:  Dr.Danford   Orders Received? Or Actions Taken?:  No new orders or change in  current treatment plan.  April [name], Tivis Ringer, RN

## 2021-12-09 NOTE — Assessment & Plan Note (Addendum)
The patient has had radiographic evidence of cirrhosis for a long time, but appeared fairly well compensated.    However, the current illness appears to have decompensated it, and unmasked the extent to which cirrhosis was driving her edema.  This concurrent hepatic and renal failure portends a worse prognosis.  INR 1.6, MELD relatively low, but prognosis poor in setting of concurrent renal failure.

## 2021-12-09 NOTE — Assessment & Plan Note (Signed)
Patient with acute encephalopathy today, confused, somnolent.  Ammonia checked and it is up to 159. - Start lactulose and rifaximin

## 2021-12-09 NOTE — Progress Notes (Signed)
Progress Note   Patient: April [name] HDQ:[id number] DOB: October 28, 1940 DOA: 11/23/2021     15 DOS: the patient was seen and examined on 12/09/2021 at 9:20 AM      Brief hospital course: 81 yo F with dCHF, AF on amiodarone no AC, CKD IV baseline 2-2.4 who presented for swelling and weight gain.      Assessment and Plan: * Acute renal failure superimposed on stage 4 chronic kidney disease (HCC) Creatinine stable but worsening edema today.  Status post tunneled catheter placement yesterday. - HD per nephrology   Anemia of chronic kidney failure, stage 4 (severe) ([place]) Patient's Hgb has been trending down steadily over the last few months.  Certainly this is multifactorial with renal failure driving it, and chronic GI blood loss contribtuing.  Iron studies in June showed ferritin 24  Hemoglobin up slightly today - Hold Eliquis - Continue IV iron - Continue Aranesp   Edema Cardiology consulted.  Echo repeated, rules out a significant component of right heart failure/pHTN.  Cardiology feels she does not have acute congestive symptoms and that her swelling is multifactorial from cirrhosis, third spacing/hypoalbuminemia, worsening renal failure, venous insufficiency rather than acute congestive heart failure  Acute hepatic encephalopathy ([place]) Patient with acute encephalopathy today, confused, somnolent.  Ammonia checked and it is up to 159. - Start lactulose and rifaximin   Acute metabolic encephalopathy Due to hepatic encephalopathy  Cirrhosis ([place]) The patient has had radiographic evidence of cirrhosis for a long time, but appeared fairly well compensated.  However during his hospitalization it appears that her edema and hypoalbuminemia are largely driven by cirrhosis as well.  It is not responded to diuretics, and overall portends a worse prognosis. -Check INR  Hyponatremia - Defer HD to Nephrology  Chronic kidney disease (CKD), stage IV (severe) ([place]) - Consult  Nephrology  Hypokalemia Resolved   Chronic diastolic CHF (congestive heart failure) ([place]) See above, cardiology feel that acute CHF is ruled out. - Hold metolazone and torsemide - Consult Cardiology  Chronic obstructive pulmonary disease, unspecified COPD type ([place]) No active disease  Paroxysmal atrial fibrillation ([place]) Not on anticoagulation - Continue amiodarone  CAD S/P PCI pRCA Promus DES 3.5 x 16 (4.1 mm), ostRPDA Promus DES 2.5 x 12 (2.7 mm) No angina  Morbid obesity ([place]) BMI 36 with history hypertension, atherosclerosis  OSA on CPAP - CPAP at night  Hyperlipidemia Not on statin  Essential hypertension, benign - Hold torsemide          Subjective: Patient is somnolent this morning, she is disoriented.  She has had no fever, no vomiting.  Her swelling is worse.  She has no chest pain or dyspnea.     Physical Exam: Vitals:   12/09/21 1615 12/09/21 1627 12/09/21 1630 12/09/21 1659  BP: (!) 115/48 116/75  (!) 116/51  Pulse: 82   82  Resp: 13     Temp:  98.5 F (36.9 C)  98.2 F (36.8 C)  TempSrc:  Oral  Oral  SpO2: 97% 96% 97% 96%  Weight:      Height:       Elderly female, lying in bed, appears somnolent RRR, no murmurs, pitting edema with peau d'orange up to the groin Respiratory rate shallow, no rales or wheezes appreciated Attention diminished, affect blunted, psychomotor slowing noted Generalized weakness in all 4 extremities, speech slurred due to overall psychomotor slowing, but no face asymmetry, symmetric motor movements, extraocular movements intact  Data Reviewed: Discussed with nephrology Ammonia up to  159, lactic acid elevated in the setting of cirrhosis Hemoglobin up to 8.6, white blood cell count 12 Basic metabolic panel shows mild improvement in creatinine  Family Communication: Son at the bedside    Disposition: Status is: Inpatient The patient is admitted with fluid overload, which is multifactorial from renal failure,  cirrhosis, and hypoalbuminemia.  She appears to be failing to thrive, nephrology recommended initiating dialysis, but her prognosis is worsening        Author: Edwin Dada, MD 12/09/2021 5:15 PM  For on call review [email or web address].

## 2021-12-09 NOTE — Progress Notes (Signed)
Physical Therapy Treatment Patient Details Name: April [name] MRN: [id number] DOB: [birth year] Today's Date: 12/09/2021   History of Present Illness 81 y.o. female presented to hospital 7/26 with peripheral edema.  Pt admitted with acute on chronic diastolic CHF.  PMH CHF, anemia, sleep apnea, CAD s/p tenting, h/o paroxysmal a-fib (not on anticoagulation secondary to recurrent GI bleed), CKD, spasmodic dysphonia, STEMI, urine incontinence, B breast surgery.    PT Comments    RN tech approached Chief Strategy Officer with concerns about pt. She was attempted to ambulate to BR however unable to make it. When author arrived to room, pt was sitting in standard armchair presenting with altered mental status. She was assisted to bed and repositioned supine. Vitals were monitored over several minutes.all were stable. RN and MD aware. PT will continue to follow and progress as able per current POC. Author suspects pt will return to previous abilities quickly and be able to return home with [facility]. Acute PT will continue to monitor  and progress per current POC. Pt to start HD later this date.    Recommendations for follow up therapy are one component of a multi-disciplinary discharge planning process, led by the attending physician.  Recommendations may be updated based on patient status, additional functional criteria and insurance authorization.  Follow Up Recommendations  Home health PT     Assistance Recommended at Discharge Set up Supervision/Assistance  Patient can return home with the following A little help with bathing/dressing/bathroom;Assistance with cooking/housework;Assist for transportation;Help with stairs or ramp for entrance   Equipment Recommendations  Rolling walker (2 wheels);BSC/3in1    Recommendations for Other Services       Precautions / Restrictions Precautions Precautions: Fall Restrictions Weight Bearing Restrictions: No     Mobility  Bed Mobility Overal bed mobility: Needs  Assistance Bed Mobility: Sit to Supine  Sit to supine: Min assist    Transfers Overall transfer level: Modified independent Equipment used: Rolling walker (2 wheels) Transfers: Sit to/from Stand Sit to Stand: Min guard  General transfer comment: CGA for safety    Ambulation/Gait Ambulation/Gait assistance: Min guard Gait Distance (Feet): 15 Feet Assistive device: Rolling walker (2 wheels) Gait Pattern/deviations: Shuffle, Step-through pattern Gait velocity: decreased     General Gait Details: Chief Strategy Officer was contacted by Engineer, manufacturing stating pt was walking to BR but was unable and is currently having some altered mental concerns.    Balance Overall balance assessment: Needs assistance Sitting-balance support: No upper extremity supported Sitting balance-Leahy Scale: Good     Standing balance support: Bilateral upper extremity supported, During functional activity, No upper extremity supported Standing balance-Leahy Scale: Fair     Cognition Arousal/Alertness: Awake/alert Behavior During Therapy: WFL for tasks assessed/performed Overall Cognitive Status: Within Functional Limits for tasks assessed                Pertinent Vitals/Pain Pain Assessment Pain Assessment: No/denies pain Pain Score: 0-No pain Pain Intervention(s): Limited activity within patient's tolerance, Monitored during session, Premedicated before session, Repositioned     PT Goals (current goals can now be found in the care plan section) Acute Rehab PT Goals Patient Stated Goal: to go home    Frequency    Min 2X/week      PT Plan Current plan remains appropriate       AM-PAC PT "6 Clicks" Mobility   Outcome Measure  Help needed turning from your back to your side while in a flat bed without using bedrails?: None Help needed moving from lying on your  back to sitting on the side of a flat bed without using bedrails?: A Little Help needed moving to and from a bed to a chair (including a  wheelchair)?: A Little Help needed standing up from a chair using your arms (e.g., wheelchair or bedside chair)?: A Little Help needed to walk in hospital room?: A Little Help needed climbing 3-5 steps with a railing? : A Little 6 Click Score: 19    End of Session   Activity Tolerance: Other (comment) (MD and RN made aware post session) Patient left: in bed;with call bell/phone within reach;with family/visitor present;with bed alarm set;with nursing/sitter in room (RN tech in room) Nurse Communication: Mobility status PT Visit Diagnosis: Unsteadiness on feet (R26.81);Muscle weakness (generalized) (M62.81);Other abnormalities of gait and mobility (R26.89)     Time:  -     Charges:                        Julaine Fusi PTA 12/09/21, 8:52 PM

## 2021-12-09 NOTE — Progress Notes (Signed)
[facility]  ROUNDING NOTE   Subjective:   April [name] is a 81 year old female with past medical history including diastolic CHF, anemia, CAD, paroxysmal atrial fibrillation without anticoagulation.  GI bleed.  Chronic kidney disease stage IV.  Patient reports to ED with complaints of lower extremity edema.  Patient has been admitted for Acute CHF (congestive heart failure) ([place]) [I50.9] Acute decompensated heart failure ([place]) [I50.9]  Patient is known to our practice and is followed outpatient by Dr. Candiss Norse.    Update Patient resting quietly in bed, son at bedside Patient appears more somnolent today Poor appetite with breakfast  Sodium 129 Creatinine 3.16  Objective:  Vital signs in last 24 hours:  Temp:  [97.5 F (36.4 C)-98.7 F (37.1 C)] 97.8 F (36.6 C) (08/11 1140) Pulse Rate:  [61-74] 74 (08/11 1140) Resp:  [14-22] 22 (08/11 1140) BP: (103-152)/(46-90) 141/59 (08/11 1140) SpO2:  [95 %-100 %] 96 % (08/11 1140) Weight:  [87.5 kg] 87.5 kg (08/11 0651)  Weight change: -1.2 kg Filed Weights   12/07/21 0407 12/08/21 0451 12/09/21 0651  Weight: 84.5 kg 88.7 kg 87.5 kg    Intake/Output: I/O last 3 completed shifts: In: 240 [P.O.:240] Out: 200 [Urine:200]   Intake/Output this shift:  No intake/output data recorded.  Physical Exam: General: NAD  Head: Normocephalic, atraumatic. Moist oral mucosal membranes  Eyes: Anicteric  Lungs:  Clear to auscultation, normal effort, room air  Heart: Regular rate and rhythm  Abdomen:  Soft, nontender, nondistended  Extremities: 2+ peripheral edema.  Neurologic: Arousable but somnolent, moving all four extremities  Skin: No lesions, dry  Access: Right femora temp cath placed on 12/08/2021 by Dr. Lucky Cowboy    Basic Metabolic Panel: Recent Labs  Lab 12/05/21 0326 12/06/21 0431 12/07/21 0444 12/08/21 0659 12/09/21 0638  NA 132* 133* 131* 128* 129*  K 4.7 4.5 4.5 4.4 4.7  CL 100 100 101 97* 98  CO2 '26 24 24 24  23  '$ GLUCOSE 93 86 87 92 110*  BUN 46* 51* 53* 53* 54*  CREATININE 3.01* 3.29* 3.34* 3.53* 3.16*  CALCIUM 8.3* 8.4* 8.1* 8.2* 8.7*  MG 2.2 2.2 2.2 2.4 2.4     Liver Function Tests: No results for input(s): "AST", "ALT", "ALKPHOS", "BILITOT", "PROT", "ALBUMIN" in the last 168 hours.  No results for input(s): "LIPASE", "AMYLASE" in the last 168 hours. Recent Labs  Lab 12/09/21 0857  AMMONIA 159*    CBC: Recent Labs  Lab 12/05/21 0326 12/06/21 0431 12/07/21 0444 12/08/21 0659 12/09/21 0638  WBC 9.9 10.0 8.6 9.2 12.4*  HGB 7.7* 7.7* 7.6* 7.6* 8.6*  HCT 22.4* 22.8* 22.2* 22.0* 24.9*  MCV 89.2 88.7 87.1 88.4 86.5  PLT 183 194 193 199 213     Cardiac Enzymes: No results for input(s): "CKTOTAL", "CKMB", "CKMBINDEX", "TROPONINI" in the last 168 hours.  BNP: Invalid input(s): "POCBNP"  CBG: Recent Labs  Lab 12/09/21 0817  GLUCAP 118*     Microbiology: Results for orders placed or performed during the hospital encounter of 07/07/21  Resp Panel by RT-PCR (Flu A&B, Covid) Nasopharyngeal Swab     Status: None   Collection Time: 07/07/21  8:01 PM   Specimen: Nasopharyngeal Swab; Nasopharyngeal(NP) swabs in vial transport medium  Result Value Ref Range Status   SARS Coronavirus 2 by RT PCR NEGATIVE NEGATIVE Final    Comment: (NOTE) SARS-CoV-2 target nucleic acids are NOT DETECTED.  The SARS-CoV-2 RNA is generally detectable in upper respiratory specimens during the acute phase of  infection. The lowest concentration of SARS-CoV-2 viral copies this assay can detect is 138 copies/mL. A negative result does not preclude SARS-Cov-2 infection and should not be used as the sole basis for treatment or other patient management decisions. A negative result may occur with  improper specimen collection/handling, submission of specimen other than nasopharyngeal swab, presence of viral mutation(s) within the areas targeted by this assay, and inadequate number of viral copies(<138  copies/mL). A negative result must be combined with clinical observations, patient history, and epidemiological information. The expected result is Negative.  Fact Sheet for Patients:  EntrepreneurPulse.com.au  Fact Sheet for Healthcare Providers:  IncredibleEmployment.be  This test is no t yet approved or cleared by the [place] FDA and  has been authorized for detection and/or diagnosis of SARS-CoV-2 by FDA under an Emergency Use Authorization (EUA). This EUA will remain  in effect (meaning this test can be used) for the duration of the COVID-19 declaration under Section 564(b)(1) of the Act, 21 U.S.C.section 360bbb-3(b)(1), unless the authorization is terminated  or revoked sooner.       Influenza A by PCR NEGATIVE NEGATIVE Final   Influenza B by PCR NEGATIVE NEGATIVE Final    Comment: (NOTE) The Xpert Xpress SARS-CoV-2/FLU/RSV plus assay is intended as an aid in the diagnosis of influenza from Nasopharyngeal swab specimens and should not be used as a sole basis for treatment. Nasal washings and aspirates are unacceptable for Xpert Xpress SARS-CoV-2/FLU/RSV testing.  Fact Sheet for Patients: EntrepreneurPulse.com.au  Fact Sheet for Healthcare Providers: IncredibleEmployment.be  This test is not yet approved or cleared by the [place] FDA and has been authorized for detection and/or diagnosis of SARS-CoV-2 by FDA under an Emergency Use Authorization (EUA). This EUA will remain in effect (meaning this test can be used) for the duration of the COVID-19 declaration under Section 564(b)(1) of the Act, 21 U.S.C. section 360bbb-3(b)(1), unless the authorization is terminated or revoked.  Performed at [facility], [address] [place], [place] [address]     Coagulation Studies: No results for input(s): "LABPROT", "INR" in the last 72 hours.   Urinalysis: No results for  input(s): "COLORURINE", "LABSPEC", "PHURINE", "GLUCOSEU", "HGBUR", "BILIRUBINUR", "KETONESUR", "PROTEINUR", "UROBILINOGEN", "NITRITE", "LEUKOCYTESUR" in the last 72 hours.  Invalid input(s): "APPERANCEUR"    Imaging: DG Chest Port 1 View  Result Date: 12/09/2021 CLINICAL DATA:  Altered mental status. EXAM: PORTABLE CHEST 1 VIEW COMPARISON:  Chest radiographs 11/23/2021 FINDINGS: The cardiomediastinal silhouette is unchanged with normal heart size. A moderately large hiatal hernia is again noted. No airspace consolidation, edema, or pneumothorax is identified. Trace pleural effusions are questioned. No acute osseous abnormality is seen. IMPRESSION: Possible trace pleural effusions. No evidence of edema or pneumonia. Electronically Signed   By: Logan Bores M.D.   On: 12/09/2021 09:22   ECHOCARDIOGRAM COMPLETE  Result Date: 12/08/2021    ECHOCARDIOGRAM REPORT   Patient Name:   April [name] Date of Exam: 12/08/2021 Medical Rec #:  [id number]       Height:       60.0 in Accession #:    [id number]      Weight:       195.5 lb Date of Birth:  Sep 06, 1940       BSA:          1.849 m Patient Age:    13 years        BP:           112/61 mmHg Patient Gender: F  HR:           63 bpm. Exam Location:  [facility] Procedure: 2D Echo, Cardiac Doppler and Color Doppler Indications:     Cardiomyopathy --Unspecified I 42.9  History:         Patient has prior history of Echocardiogram examinations, most                  recent 08/10/2021. CHF, Arrythmias:LBBB; Risk Factors:Diabetes.                  STEMI.  Sonographer:     Sherrie Sport Referring Phys:  [id number] CHRISTOPHER P DANFORD Diagnosing Phys: Kathlyn Sacramento MD IMPRESSIONS  1. Left ventricular ejection fraction, by estimation, is 55 to 60%. The left ventricle has normal function. The left ventricle has no regional wall motion abnormalities. Left ventricular diastolic parameters are consistent with Grade II diastolic dysfunction (pseudonormalization).  2. Right  ventricular systolic function is normal. The right ventricular size is normal. There is mildly elevated pulmonary artery systolic pressure. The estimated right ventricular systolic pressure is 67.6 mmHg.  3. Left atrial size was mildly dilated.  4. The mitral valve is normal in structure. No evidence of mitral valve regurgitation. No evidence of mitral stenosis.  5. The aortic valve is normal in structure. Aortic valve regurgitation is not visualized. Aortic valve sclerosis is present, with no evidence of aortic valve stenosis. FINDINGS  Left Ventricle: Left ventricular ejection fraction, by estimation, is 55 to 60%. The left ventricle has normal function. The left ventricle has no regional wall motion abnormalities. The left ventricular internal cavity size was normal in size. There is  borderline left ventricular hypertrophy. Left ventricular diastolic parameters are consistent with Grade II diastolic dysfunction (pseudonormalization). Right Ventricle: The right ventricular size is normal. No increase in right ventricular wall thickness. Right ventricular systolic function is normal. There is mildly elevated pulmonary artery systolic pressure. The tricuspid regurgitant velocity is 3.01  m/s, and with an assumed right atrial pressure of 5 mmHg, the estimated right ventricular systolic pressure is 19.5 mmHg. Left Atrium: Left atrial size was mildly dilated. Right Atrium: Right atrial size was normal in size. Pericardium: There is no evidence of pericardial effusion. Mitral Valve: The mitral valve is normal in structure. No evidence of mitral valve regurgitation. No evidence of mitral valve stenosis. Tricuspid Valve: The tricuspid valve is normal in structure. Tricuspid valve regurgitation is mild . No evidence of tricuspid stenosis. Aortic Valve: The aortic valve is normal in structure. Aortic valve regurgitation is not visualized. Aortic valve sclerosis is present, with no evidence of aortic valve stenosis. Aortic  valve mean gradient measures 9.3 mmHg. Aortic valve peak gradient measures 16.4 mmHg. Aortic valve area, by VTI measures 0.83 cm. Pulmonic Valve: The pulmonic valve was normal in structure. Pulmonic valve regurgitation is not visualized. No evidence of pulmonic stenosis. Aorta: The aortic root is normal in size and structure. Venous: The inferior vena cava was not well visualized. IAS/Shunts: No atrial level shunt detected by color flow Doppler.  LEFT VENTRICLE PLAX 2D LVIDd:         4.60 cm   Diastology LVIDs:         3.20 cm   LV e' medial:    4.13 cm/s LV PW:         1.00 cm   LV E/e' medial:  23.8 LV IVS:        1.00 cm   LV e' lateral:   7.62 cm/s LVOT diam:  1.25 cm   LV E/e' lateral: 12.9 LV SV:         39 LV SV Index:   21 LVOT Area:     1.23 cm  RIGHT VENTRICLE RV Basal diam:  4.30 cm RV S prime:     14.10 cm/s TAPSE (M-mode): 3.3 cm LEFT ATRIUM             Index        RIGHT ATRIUM           Index LA diam:        4.10 cm 2.22 cm/m   RA Area:     21.00 cm LA Vol (A2C):   81.9 ml 44.30 ml/m  RA Volume:   59.50 ml  32.18 ml/m LA Vol (A4C):   79.4 ml 42.95 ml/m LA Biplane Vol: 86.6 ml 46.84 ml/m  AORTIC VALVE AV Area (Vmax):    0.69 cm AV Area (Vmean):   0.78 cm AV Area (VTI):     0.83 cm AV Vmax:           202.67 cm/s AV Vmean:          142.667 cm/s AV VTI:            0.465 m AV Peak Grad:      16.4 mmHg AV Mean Grad:      9.3 mmHg LVOT Vmax:         114.00 cm/s LVOT Vmean:        90.900 cm/s LVOT VTI:          0.314 m LVOT/AV VTI ratio: 0.67  AORTA Ao Root diam: 2.63 cm MITRAL VALVE               TRICUSPID VALVE MV Area (PHT): 3.27 cm    TR Peak grad:   36.2 mmHg MV Decel Time: 232 msec    TR Vmax:        301.00 cm/s MV E velocity: 98.50 cm/s MV A velocity: 99.00 cm/s  SHUNTS MV E/A ratio:  0.99        Systemic VTI:  0.31 m                            Systemic Diam: 1.25 cm Kathlyn Sacramento MD Electronically signed by Kathlyn Sacramento MD Signature Date/Time: 12/08/2021/5:41:58 PM    Final     PERIPHERAL VASCULAR CATHETERIZATION  Result Date: 12/08/2021 See surgical note for result.    Medications:    sodium chloride Stopped (12/03/21 1136)   anticoagulant sodium citrate     ferric gluconate (FERRLECIT) IVPB 125 mg (12/09/21 0950)   promethazine (PHENERGAN) injection (IM or IVPB) 12.5 mg (12/05/21 1301)    amiodarone  200 mg Oral Daily   Chlorhexidine Gluconate Cloth  6 each Topical Q0600   darbepoetin (ARANESP) injection - NON-DIALYSIS  100 mcg Subcutaneous Q Fri-1800   docusate sodium  100 mg Oral BID   heparin injection (subcutaneous)  5,000 Units Subcutaneous Q8H   lactulose  30 g Oral TID   lidocaine  2 patch Transdermal Q24H   pantoprazole  40 mg Oral BID   rifaximin  200 mg Oral BID   sodium chloride, acetaminophen, alteplase, anticoagulant sodium citrate, diclofenac Sodium, heparin, lidocaine (PF), lidocaine-prilocaine, nitroGLYCERIN, ondansetron (ZOFRAN) IV, pentafluoroprop-tetrafluoroeth, promethazine (PHENERGAN) injection (IM or IVPB)  Assessment/ Plan:  April [name] is a 81 y.o.  female with past medical history including diastolic CHF,  anemia, CAD, paroxysmal atrial fibrillation without anticoagulation.  GI bleed.  Chronic kidney disease stage IV.  Patient reports to ED with complaints of lower extremity edema.  Patient has been admitted for Acute CHF (congestive heart failure) ([place]) [I50.9] Acute decompensated heart failure ([place]) [I50.9]   Acute Kidney Injury with hyponatremia on chronic kidney disease stage IV with baseline creatinine 1.75 and GFR of 29 on 08/24/21.  Acute kidney injury secondary to volume overload and diuresis.  No IV contrast exposure.  No acute need for dialysis.   Appreciate vascular placing HD temp cath yesterday.  Creatinine appears improved today however mentation has declined.  Patient appears somnolent, arousable.  Will provide initial dialysis treatment today with additional treatment tomorrow.  Will allow patient to rest  on Sunday and monitor mentation and labs.  Though we feel dialysis is temporary at this time we will continue to monitor.   Lab Results  Component Value Date   CREATININE 3.16 (H) 12/09/2021   CREATININE 3.53 (H) 12/08/2021   CREATININE 3.34 (H) 12/07/2021    Intake/Output Summary (Last 24 hours) at 12/09/2021 1250 Last data filed at 12/09/2021 1143 Gross per 24 hour  Intake 0 ml  Output 0 ml  Net 0 ml    2. Chronic diastolic heart failure. ECHO from 08/10/21 shows EF 60-65% with a Grade II diastolic dysfunction with a mildly mitral valve regurgitation.  Diuretics held in setting of kidney injury.  Initiating dialysis today.    3. Anemia of chronic kidney disease Lab Results  Component Value Date   HGB 8.6 (L) 12/09/2021    Hemoglobin not at goal.  Will continue to monitor.  4. Hypokalemia.  Resolved with supplementation.   LOS: [place] 8/11/202312:50 PM

## 2021-12-09 NOTE — Progress Notes (Addendum)
Patient mental status changed since yesterday, patient much weaker and unable to stand now. Edema increased in lower legs and abdomen Dr notified and at bedside to assess patient.  Tori Dattilio, Tivis Ringer, RN

## 2021-12-10 DIAGNOSIS — I2510 Atherosclerotic heart disease of native coronary artery without angina pectoris: Secondary | ICD-10-CM | POA: Diagnosis not present

## 2021-12-10 DIAGNOSIS — K7682 Hepatic encephalopathy: Secondary | ICD-10-CM | POA: Diagnosis not present

## 2021-12-10 DIAGNOSIS — N170 Acute kidney failure with tubular necrosis: Secondary | ICD-10-CM | POA: Diagnosis not present

## 2021-12-10 DIAGNOSIS — N184 Chronic kidney disease, stage 4 (severe): Secondary | ICD-10-CM | POA: Diagnosis not present

## 2021-12-10 DIAGNOSIS — I5032 Chronic diastolic (congestive) heart failure: Secondary | ICD-10-CM | POA: Diagnosis not present

## 2021-12-10 LAB — BASIC METABOLIC PANEL
Anion gap: 9 (ref 5–15)
BUN: 44 mg/dL — ABNORMAL HIGH (ref 8–23)
CO2: 24 mmol/L (ref 22–32)
Calcium: 8.3 mg/dL — ABNORMAL LOW (ref 8.9–10.3)
Chloride: 100 mmol/L (ref 98–111)
Creatinine, Ser: 2.75 mg/dL — ABNORMAL HIGH (ref 0.44–1.00)
GFR, Estimated: 17 mL/min — ABNORMAL LOW (ref >60)
Glucose, Bld: 100 mg/dL — ABNORMAL HIGH (ref 70–99)
Potassium: 4.1 mmol/L (ref 3.5–5.1)
Sodium: 133 mmol/L — ABNORMAL LOW (ref 135–145)

## 2021-12-10 LAB — CBC
HCT: 20.7 % — ABNORMAL LOW (ref 36.0–46.0)
Hemoglobin: 7.2 g/dL — ABNORMAL LOW (ref 12.0–15.0)
MCH: 30.8 pg (ref 26.0–34.0)
MCHC: 34.8 g/dL (ref 30.0–36.0)
MCV: 88.5 fL (ref 80.0–100.0)
Platelets: 159 10*3/uL (ref 150–400)
RBC: 2.34 MIL/uL — ABNORMAL LOW (ref 3.87–5.11)
RDW: 20.3 % — ABNORMAL HIGH (ref 11.5–15.5)
WBC: 16.1 10*3/uL — ABNORMAL HIGH (ref 4.0–10.5)
nRBC: 0.2 % (ref 0.0–0.2)

## 2021-12-10 LAB — MAGNESIUM: Magnesium: 2.2 mg/dL (ref 1.7–2.4)

## 2021-12-10 LAB — HEPATITIS B SURFACE ANTIBODY, QUANTITATIVE: Hep B S AB Quant (Post): <3.1 m[IU]/mL — ABNORMAL LOW (ref >9.9)

## 2021-12-10 MED ORDER — ALBUMIN HUMAN 5 % IV SOLN
25.0000 g | Freq: Once | INTRAVENOUS | Status: DC
  Filled 2021-12-10: qty 500

## 2021-12-10 MED ORDER — HEPARIN SODIUM (PORCINE) 1000 UNIT/ML IJ SOLN
INTRAMUSCULAR | Status: AC
  Administered 2021-12-10: 1000 [IU]
  Filled 2021-12-10: qty 3

## 2021-12-10 MED ORDER — ALBUMIN HUMAN 25 % IV SOLN
INTRAVENOUS | Status: AC
  Administered 2021-12-10: 12.5 g
  Filled 2021-12-10: qty 100

## 2021-12-10 MED ORDER — LACTULOSE 10 GM/15ML PO SOLN
20.0000 g | Freq: Two times a day (BID) | ORAL | Status: DC
  Administered 2021-12-11 – 2021-12-19 (×14): 20 g via ORAL
  Filled 2021-12-10 (×17): qty 30

## 2021-12-10 MED ORDER — ALBUMIN HUMAN 25 % IV SOLN: 25.0000 g | Freq: Once | INTRAVENOUS | Status: AC

## 2021-12-10 NOTE — Progress Notes (Signed)
[facility]  PROGRESS NOTE   Subjective:   Patient seen on dialysis.  Awake and alert and denies any chest pain or shortness of breath.   Hypotensive and was given SPA.  Objective:  Vital signs: Blood pressure (!) 108/47, pulse 78, temperature 98.3 F (36.8 C), temperature source Oral, resp. rate 15, height 5' (1.524 m), weight 83.1 kg, last menstrual period 05/01/1972, SpO2 96 %.  Intake/Output Summary (Last 24 hours) at 12/10/2021 1142 Last data filed at 12/10/2021 1114 Gross per 24 hour  Intake 0 ml  Output 500 ml  Net -500 ml   Filed Weights   12/10/21 0445 12/10/21 0843 12/10/21 1135  Weight: 88 kg 84 kg 83.1 kg     Physical Exam: General:  No acute distress  Head:  Normocephalic, atraumatic. Moist oral mucosal membranes  Eyes:  Anicteric  Neck:  Supple  Lungs:   Clear to auscultation, normal effort  Heart:  S1S2 no rubs  Abdomen:   Soft, nontender, bowel sounds present  Extremities:  peripheral edema.  Neurologic:  Awake, alert, following commands  Skin:  No lesions  Access:     Basic Metabolic Panel: Recent Labs  Lab 12/06/21 0431 12/07/21 0444 12/08/21 0659 12/09/21 0638 12/10/21 0555  NA 133* 131* 128* 129* 133*  K 4.5 4.5 4.4 4.7 4.1  CL 100 101 97* 98 100  CO2 '24 24 24 23 24  '$ GLUCOSE 86 87 92 110* 100*  BUN 51* 53* 53* 54* 44*  CREATININE 3.29* 3.34* 3.53* 3.16* 2.75*  CALCIUM 8.4* 8.1* 8.2* 8.7* 8.3*  MG 2.2 2.2 2.4 2.4 2.2    CBC: Recent Labs  Lab 12/06/21 0431 12/07/21 0444 12/08/21 0659 12/09/21 0638 12/10/21 0555  WBC 10.0 8.6 9.2 12.4* 16.1*  HGB 7.7* 7.6* 7.6* 8.6* 7.2*  HCT 22.8* 22.2* 22.0* 24.9* 20.7*  MCV 88.7 87.1 88.4 86.5 88.5  PLT 194 193 199 213 159     Urinalysis: No results for input(s): "COLORURINE", "LABSPEC", "PHURINE", "GLUCOSEU", "HGBUR", "BILIRUBINUR", "KETONESUR", "PROTEINUR", "UROBILINOGEN", "NITRITE", "LEUKOCYTESUR" in the last 72 hours.  Invalid input(s): "APPERANCEUR"    Imaging: DG  Chest Port 1 View  Result Date: 12/09/2021 CLINICAL DATA:  Altered mental status. EXAM: PORTABLE CHEST 1 VIEW COMPARISON:  Chest radiographs 11/23/2021 FINDINGS: The cardiomediastinal silhouette is unchanged with normal heart size. A moderately large hiatal hernia is again noted. No airspace consolidation, edema, or pneumothorax is identified. Trace pleural effusions are questioned. No acute osseous abnormality is seen. IMPRESSION: Possible trace pleural effusions. No evidence of edema or pneumonia. Electronically Signed   By: Logan Bores M.D.   On: 12/09/2021 09:22   PERIPHERAL VASCULAR CATHETERIZATION  Result Date: 12/08/2021 See surgical note for result.    Medications:    sodium chloride Stopped (12/03/21 1136)   ferric gluconate (FERRLECIT) IVPB 125 mg (12/09/21 0950)   promethazine (PHENERGAN) injection (IM or IVPB) 12.5 mg (12/05/21 1301)    amiodarone  200 mg Oral Daily   Chlorhexidine Gluconate Cloth  6 each Topical Q0600   darbepoetin (ARANESP) injection - NON-DIALYSIS  100 mcg Subcutaneous Q Fri-1800   docusate sodium  100 mg Oral BID   heparin injection (subcutaneous)  5,000 Units Subcutaneous Q8H   lactulose  30 g Oral TID   lidocaine  2 patch Transdermal Q24H   pantoprazole  40 mg Oral BID   rifaximin  200 mg Oral BID    Assessment/ Plan:     Principal Problem:   Acute renal failure superimposed on  stage 4 chronic kidney disease (HCC) Active Problems:   Essential hypertension, benign   Hyperlipidemia   OSA on CPAP   Morbid obesity (HCC)   Coronary artery disease involving native coronary artery of native heart with unstable angina pectoris (HCC)   CAD S/P PCI pRCA Promus DES 3.5 x 16 (4.1 mm), ostRPDA Promus DES 2.5 x 12 (2.7 mm)   Paroxysmal atrial fibrillation (HCC)   Chronic obstructive pulmonary disease, unspecified COPD type (HCC)   Chronic diastolic CHF (congestive heart failure) (HCC)   Hypokalemia   Chronic kidney disease (CKD), stage IV (severe)  (HCC)   Hyponatremia   Anemia of chronic kidney failure, stage 4 (severe) (HCC)   Edema   Acute metabolic encephalopathy   Acute hepatic encephalopathy (HCC)   Cirrhosis ([place])  April [name] is a 81 y.o.  female with past medical history including diastolic CHF, anemia, CAD, paroxysmal atrial fibrillation without anticoagulation.  GI bleed.  Chronic kidney disease stage IV.  Patient reports to ED with complaints of lower extremity edema.  Patient has been admitted for Acute CHF (congestive heart failure) ([place]) [I50.9] Acute decompensated heart failure ([place]) [I50.9]  #1: Acute kidney injury on CKD: Patient was started on dialysis secondary to fluid overload and mental status changes.  Mentation has improved somewhat.  She is hypotensive on dialysis.  We will give her SPA today.  #2: Anemia: We will continue anemia protocols.  #3: Congestive heart failure: We will continue dialysis with fluid removal as tolerated.  Patient is advised to stay on fluid restriction.  We will continue to monitor closely.   LOS: [place], [facility] 8/12/202311:42 AM

## 2021-12-10 NOTE — Progress Notes (Signed)
OT Cancellation Note  Patient Details Name: SHONNIE POUDRIER MRN: [id number] DOB: 1940/10/30   Cancelled Treatment:    Reason Eval/Treat Not Completed: Patient at procedure or test/ unavailable. Pt out of the room for dialysis this morning. Will re-attempt at later date/time for OT re-evaluation.   Ardeth Perfect., MPH, MS, OTR/L ascom [phone] 12/10/21, 8:44 AM

## 2021-12-10 NOTE — Progress Notes (Signed)
Progress Note  Patient Name: April [name] Date of Encounter: 12/10/2021  [facility] Cardiologist: Ida Rogue, MD   Subjective   Feeling OK.  Denies chest pain or shortness of breath  Inpatient Medications    Scheduled Meds:  amiodarone  200 mg Oral Daily   Chlorhexidine Gluconate Cloth  6 each Topical Q0600   darbepoetin (ARANESP) injection - NON-DIALYSIS  100 mcg Subcutaneous Q Fri-1800   docusate sodium  100 mg Oral BID   heparin injection (subcutaneous)  5,000 Units Subcutaneous Q8H   lactulose  30 g Oral TID   lidocaine  2 patch Transdermal Q24H   pantoprazole  40 mg Oral BID   rifaximin  200 mg Oral BID   Continuous Infusions:  sodium chloride Stopped (12/03/21 1136)   ferric gluconate (FERRLECIT) IVPB 125 mg (12/09/21 0950)   promethazine (PHENERGAN) injection (IM or IVPB) 12.5 mg (12/05/21 1301)   PRN Meds: sodium chloride, acetaminophen, diclofenac Sodium, nitroGLYCERIN, ondansetron (ZOFRAN) IV, promethazine (PHENERGAN) injection (IM or IVPB)   Vital Signs    Vitals:   12/09/21 1939 12/10/21 0008 12/10/21 0358 12/10/21 0445  BP: 127/77 (!) 122/46 (!) 111/40   Pulse: 83 79 70   Resp: '18 18 18   '$ Temp: 98.3 F (36.8 C) 98.1 F (36.7 C) 98.1 F (36.7 C)   TempSrc:      SpO2: 95% 95% 96%   Weight:    88 kg  Height:        Intake/Output Summary (Last 24 hours) at 12/10/2021 0543 Last data filed at 12/09/2021 1832 Gross per 24 hour  Intake 0 ml  Output 0 ml  Net 0 ml      12/10/2021    4:45 AM 12/09/2021    6:51 AM 12/08/2021    4:51 AM  Last 3 Weights  Weight (lbs) 194 lb 0.1 oz 192 lb 14.4 oz 195 lb 8.8 oz  Weight (kg) 88 kg 87.5 kg 88.7 kg      Telemetry    Sinus rhythm.  First-degree AV block.  PVCs.- Personally Reviewed  ECG    N/a - Personally Reviewed  Physical Exam   VS:  BP (!) 111/40 (BP Location: Right Arm)   Pulse 70   Temp 98.1 F (36.7 C)   Resp 18   Ht 5' (1.524 m)   Wt 88 kg   LMP 05/01/1972   SpO2 96%    BMI 37.89 kg/m  , BMI Body mass index is 37.89 kg/m. GENERAL:  Well appearing on CPAP.  Lying comfortably flat in bed. HEENT: Pupils equal round and reactive, fundi not visualized, oral mucosa unremarkable NECK:  No jugular venous distention, waveform within normal limits, carotid upstroke brisk and symmetric, no bruits, no thyromegaly LUNGS:  Clear to auscultation bilaterally HEART:  RRR.  PMI not displaced or sustained,S1 and S2 within normal limits, no S3, no S4, no clicks, no rubs, no murmurs ABD:  Flat, positive bowel sounds normal in frequency in pitch, no bruits, no rebound, no guarding, no midline pulsatile mass, no hepatomegaly, no splenomegaly EXT:  2 plus pulses throughout, 2+ LE edema to mid tibia bilaterally. No cyanosis no clubbing SKIN:  No rashes no nodules NEURO:  Cranial nerves II through XII grossly intact, motor grossly intact throughout PSYCH:  Cognitively intact, oriented to person place and time   Labs    High Sensitivity Troponin:   Recent Labs  Lab 11/23/21 1514 11/23/21 1714  TROPONINIHS 18* 21*     Chemistry  Recent Labs  Lab 12/07/21 0444 12/08/21 0659 12/09/21 0638  NA 131* 128* 129*  K 4.5 4.4 4.7  CL 101 97* 98  CO2 '24 24 23  '$ GLUCOSE 87 92 110*  BUN 53* 53* 54*  CREATININE 3.34* 3.53* 3.16*  CALCIUM 8.1* 8.2* 8.7*  MG 2.2 2.4 2.4  GFRNONAA 13* 12* 14*  ANIONGAP '6 7 8    '$ Lipids No results for input(s): "CHOL", "TRIG", "HDL", "LABVLDL", "LDLCALC", "CHOLHDL" in the last 168 hours.  Hematology Recent Labs  Lab 12/07/21 0444 12/08/21 0659 12/09/21 0638  WBC 8.6 9.2 12.4*  RBC 2.55* 2.49* 2.88*  HGB 7.6* 7.6* 8.6*  HCT 22.2* 22.0* 24.9*  MCV 87.1 88.4 86.5  MCH 29.8 30.5 29.9  MCHC 34.2 34.5 34.5  RDW 19.6* 19.8* 19.4*  PLT 193 199 213   Thyroid No results for input(s): "TSH", "FREET4" in the last 168 hours.  BNPNo results for input(s): "BNP", "PROBNP" in the last 168 hours.  DDimer No results for input(s): "DDIMER" in the last 168  hours.   Radiology    DG Chest Port 1 View  Result Date: 12/09/2021 CLINICAL DATA:  Altered mental status. EXAM: PORTABLE CHEST 1 VIEW COMPARISON:  Chest radiographs 11/23/2021 FINDINGS: The cardiomediastinal silhouette is unchanged with normal heart size. A moderately large hiatal hernia is again noted. No airspace consolidation, edema, or pneumothorax is identified. Trace pleural effusions are questioned. No acute osseous abnormality is seen. IMPRESSION: Possible trace pleural effusions. No evidence of edema or pneumonia. Electronically Signed   By: Logan Bores M.D.   On: 12/09/2021 09:22   ECHOCARDIOGRAM COMPLETE  Result Date: 12/08/2021    ECHOCARDIOGRAM REPORT   Patient Name:   April [name] Date of Exam: 12/08/2021 Medical Rec #:  [id number]       Height:       60.0 in Accession #:    [id number]      Weight:       195.5 lb Date of Birth:  Sep 07, 1940       BSA:          1.849 m Patient Age:    47 years        BP:           112/61 mmHg Patient Gender: F               HR:           63 bpm. Exam Location:  [facility] Procedure: 2D Echo, Cardiac Doppler and Color Doppler Indications:     Cardiomyopathy --Unspecified I 42.9  History:         Patient has prior history of Echocardiogram examinations, most                  recent 08/10/2021. CHF, Arrythmias:LBBB; Risk Factors:Diabetes.                  STEMI.  Sonographer:     Sherrie Sport Referring Phys:  [id number] CHRISTOPHER P DANFORD Diagnosing Phys: Kathlyn Sacramento MD IMPRESSIONS  1. Left ventricular ejection fraction, by estimation, is 55 to 60%. The left ventricle has normal function. The left ventricle has no regional wall motion abnormalities. Left ventricular diastolic parameters are consistent with Grade II diastolic dysfunction (pseudonormalization).  2. Right ventricular systolic function is normal. The right ventricular size is normal. There is mildly elevated pulmonary artery systolic pressure. The estimated right ventricular systolic pressure is 29.7  mmHg.  3. Left atrial size was mildly dilated.  4. The mitral valve is normal in structure. No evidence of mitral valve regurgitation. No evidence of mitral stenosis.  5. The aortic valve is normal in structure. Aortic valve regurgitation is not visualized. Aortic valve sclerosis is present, with no evidence of aortic valve stenosis. FINDINGS  Left Ventricle: Left ventricular ejection fraction, by estimation, is 55 to 60%. The left ventricle has normal function. The left ventricle has no regional wall motion abnormalities. The left ventricular internal cavity size was normal in size. There is  borderline left ventricular hypertrophy. Left ventricular diastolic parameters are consistent with Grade II diastolic dysfunction (pseudonormalization). Right Ventricle: The right ventricular size is normal. No increase in right ventricular wall thickness. Right ventricular systolic function is normal. There is mildly elevated pulmonary artery systolic pressure. The tricuspid regurgitant velocity is 3.01  m/s, and with an assumed right atrial pressure of 5 mmHg, the estimated right ventricular systolic pressure is 76.1 mmHg. Left Atrium: Left atrial size was mildly dilated. Right Atrium: Right atrial size was normal in size. Pericardium: There is no evidence of pericardial effusion. Mitral Valve: The mitral valve is normal in structure. No evidence of mitral valve regurgitation. No evidence of mitral valve stenosis. Tricuspid Valve: The tricuspid valve is normal in structure. Tricuspid valve regurgitation is mild . No evidence of tricuspid stenosis. Aortic Valve: The aortic valve is normal in structure. Aortic valve regurgitation is not visualized. Aortic valve sclerosis is present, with no evidence of aortic valve stenosis. Aortic valve mean gradient measures 9.3 mmHg. Aortic valve peak gradient measures 16.4 mmHg. Aortic valve area, by VTI measures 0.83 cm. Pulmonic Valve: The pulmonic valve was normal in structure.  Pulmonic valve regurgitation is not visualized. No evidence of pulmonic stenosis. Aorta: The aortic root is normal in size and structure. Venous: The inferior vena cava was not well visualized. IAS/Shunts: No atrial level shunt detected by color flow Doppler.  LEFT VENTRICLE PLAX 2D LVIDd:         4.60 cm   Diastology LVIDs:         3.20 cm   LV e' medial:    4.13 cm/s LV PW:         1.00 cm   LV E/e' medial:  23.8 LV IVS:        1.00 cm   LV e' lateral:   7.62 cm/s LVOT diam:     1.25 cm   LV E/e' lateral: 12.9 LV SV:         39 LV SV Index:   21 LVOT Area:     1.23 cm  RIGHT VENTRICLE RV Basal diam:  4.30 cm RV S prime:     14.10 cm/s TAPSE (M-mode): 3.3 cm LEFT ATRIUM             Index        RIGHT ATRIUM           Index LA diam:        4.10 cm 2.22 cm/m   RA Area:     21.00 cm LA Vol (A2C):   81.9 ml 44.30 ml/m  RA Volume:   59.50 ml  32.18 ml/m LA Vol (A4C):   79.4 ml 42.95 ml/m LA Biplane Vol: 86.6 ml 46.84 ml/m  AORTIC VALVE AV Area (Vmax):    0.69 cm AV Area (Vmean):   0.78 cm AV Area (VTI):     0.83 cm AV Vmax:           202.67 cm/s AV  Vmean:          142.667 cm/s AV VTI:            0.465 m AV Peak Grad:      16.4 mmHg AV Mean Grad:      9.3 mmHg LVOT Vmax:         114.00 cm/s LVOT Vmean:        90.900 cm/s LVOT VTI:          0.314 m LVOT/AV VTI ratio: 0.67  AORTA Ao Root diam: 2.63 cm MITRAL VALVE               TRICUSPID VALVE MV Area (PHT): 3.27 cm    TR Peak grad:   36.2 mmHg MV Decel Time: 232 msec    TR Vmax:        301.00 cm/s MV E velocity: 98.50 cm/s MV A velocity: 99.00 cm/s  SHUNTS MV E/A ratio:  0.99        Systemic VTI:  0.31 m                            Systemic Diam: 1.25 cm Kathlyn Sacramento MD Electronically signed by Kathlyn Sacramento MD Signature Date/Time: 12/08/2021/5:41:58 PM    Final    PERIPHERAL VASCULAR CATHETERIZATION  Result Date: 12/08/2021 See surgical note for result.   Cardiac Studies   Echocardiogram completed 08/10/2021 1. Left ventricular ejection fraction, by  estimation, is 60 to 65%. The  left ventricle has normal function. The left ventricle has no regional  wall motion abnormalities. Left ventricular diastolic parameters are  consistent with Grade II diastolic  dysfunction (pseudonormalization).   2. Right ventricular systolic function is normal. The right ventricular  size is mildly enlarged. There is mildly elevated pulmonary artery  systolic pressure. The estimated right ventricular systolic pressure is  50.9 mmHg.   3. Left atrial size was mildly dilated.   4. Right atrial size was mildly dilated.   5. The mitral valve is normal in structure. Mild mitral valve  regurgitation.   6. Tricuspid valve regurgitation is moderate.   7. The aortic valve is grossly normal. Aortic valve regurgitation is not  visualized.   8. The inferior vena cava is normal in size with greater than 50%  respiratory variability, suggesting right atrial pressure of 3 mmHg.    LHC 03/2016 Prox RCA lesion, 80 %stenosed. A STENT PROMUS PREM MR 3.5X16 (3.8-4.1 mm) drug eluting stent was successfully placed. Post intervention, there is a 0% residual stenosis. Ost RPDA lesion, 90 %stenosed. A STENT PROMUS PREM MR 2.5X12 (2.7 mm) drug eluting stent was successfully placed. Post intervention, there is a 0% residual stenosis. __________________________________ Prox RCA to Mid RCA lesion, 50 %stenosed. Dist RCA lesion, 30 %stenosed.- Appears smooth and chronic Dist LAD lesion, 25 %stenosed - diffuse disease in the distal LAD. Nonsignificant The left ventricular systolic function is normal. The left ventricular ejection fraction is 55-65% by visual estimate. There is no mitral valve regurgitation. There is no aortic valve stenosis.   Thankfully, the patient presented chest pain-free indicating likely resolution of 100% occluded vessel. This was found to be the case with a thrombotic 80% proximal RCA as well as a very focal 90% ostial PDA lesion. Both these were treated  with DES stents as non-Primary PCI. The digital mid RCA 50% stenosis was relatively smooth and appears to be chronic. This will be treated medically.   Preserved EF with  mildly elevated LVEDP. Severe systemic hypertension noted.   Plan: Admit to TCU, step down cardiac unit for post MI/PCI care. TR band removal per protocol Continue Aggrastat infusion until current bag complete Aspirin plus Brilinta for minimum 3 months. At which time could consider stopping aspirin. Restart home dose of carvedilol and losartan.  As she is on furosemide, would hold HCTZ. Increase home Crestor dose to 40 mg.   Expect Fast-Track Discharge    Coronary Diagrams   Diagnostic Dominance: Right Intervention        Patient Profile     81 y.o. female with CAD s/p PCI, PAF, h/o GI bleed, HTN, DM, CKD, admitted with acute on chronic diastolic heart failure.  Assessment & Plan    # HFpEF:  Renal function worsened with diuresis.  Volume is being managed with dialysis.  For session to begin today.  # CAD: # HL: S/p RCA PCI. Not on a statin 2/2 cirrhosis.  She is not on aspirin due to GI bleed and anemia.  No blocker due to hypotension.  She has no angina.    # PAF:  She is not on anticoagulation 2/2 h/o GI bleed.  She is maintaining sinus rhythm on amiodarone.  # Hypertension:  BP well-controlled off antihypertensives.  # Acute on chronic renal failure: Tunneled catheter placed for HD initiation.   # OSA:  On CPAP:  #Cirrhosis:  # Hepatic encephalopathy: Ammonia was 159.  She was started on lactulose and rifaximin.  She seems to be mentally clear this morning.      For questions or updates, please contact [place] Please consult [email or web address] for contact info under        Signed, Skeet Latch, MD  12/10/2021, 5:43 AM

## 2021-12-10 NOTE — Assessment & Plan Note (Signed)
No aspirin due to GI bleed.  No beta-blocker, ARB due to hypotension.  No statin due to cirrhosis.

## 2021-12-10 NOTE — Progress Notes (Signed)
Progress Note   Patient: April [name] YQM:[id number] DOB: 1940-11-25 DOA: 11/23/2021     16 DOS: the patient was seen and examined on 12/10/2021       Brief hospital course: 81 yo F with dCHF, AF on amiodarone no AC, CKD IV baseline 2-2.4 who presented for abdominal and leg swelling and weight gain.   Patient was initially thought to have CHF or nephrogenic edema, and so Nephrology were consulted and she was given escalating doses of Lasix.  Ultimately she developed hepatic encephalopathy, and it became clear her weight gain was ascites and hepatic swelling was a major contributor.   In the meantime, she failed to respond to diuretics, and her renal function worsened and she was started on dialysis.     Assessment and Plan: * Acute renal failure superimposed on stage 4 chronic kidney disease (HCC) S/p tunneled catheter 8/10  Creatinine better after dialysis  HD #1 8/11 HD #2 8/12  - HD per nephrology   Anemia of chronic kidney failure, stage 4 (severe) (HCC) Patient's Hgb has been trending down steadily over the last few months.  Certainly this is multifactorial with renal failure driving it, and chronic GI blood loss contribtuing.  Iron studies in June showed ferritin 24  Hemoglobin down today, no bleeding - Hold Eliquis - Continue IV iron - Continue Aranesp   Edema and ascites  Cardiology consulted.  Echo repeated, rules out a significant component of right heart failure/pHTN.   Her swelling is multifactorial from cirrhosis, third spacing/hypoalbuminemia, worsening renal failure, venous insufficiency. Acute congestive heart failure ruled out.  Acute hepatic encephalopathy ([place]) Patient with acute encephalopathy 8/11, confused, somnolent.  Ammonia checked and it was up to 159.  Lactulose started and improved today.  Had 2 Bms today already. - Continue rifaximin - Reduce lactulose to titrate to 2-3 soft BM per day   Acute metabolic encephalopathy Due to hepatic  encephalopathy  Cirrhosis ([place]) The patient has had radiographic evidence of cirrhosis for a long time, but appeared fairly well compensated.    However, the current illness appears to have decompensated it, and unmasked the extent to which cirrhosis was driving her edema.  This concurrent hepatic and renal failure portends a worse prognosis. -Check INR  Hyponatremia - Defer HD to Nephrology  Chronic kidney disease (CKD), stage IV (severe) ([place]) - Consult Nephrology  Hypokalemia Resolved   Chronic diastolic CHF (congestive heart failure) ([place]) See above, cardiology feel that acute CHF is ruled out. - Hold metolazone and torsemide - Consult Cardiology  Chronic obstructive pulmonary disease, unspecified COPD type ([place]) No active disease  Paroxysmal atrial fibrillation ([place]) Not on anticoagulation - Continue amiodarone  Coronary artery disease involving native coronary artery of native heart with unstable angina pectoris ([place]) No aspirin due to GI bleed.  No beta-blocker, ARB due to hypotension.  No statin due to cirrhosis.    Morbid obesity (HCC) BMI 36 with history hypertension, atherosclerosis  OSA on CPAP - CPAP at night  Hyperlipidemia Not on statin  Essential hypertension, benign - Hold torsemide          Subjective: Patient mentally much clearer today, no chest pain, abdominal pain.  No cough, no dysuria.  No fever.  No acute confusion.     Physical Exam: Vitals:   12/10/21 1114 12/10/21 1116 12/10/21 1135 12/10/21 1155  BP:  (!) 108/47  (!) 109/50  Pulse: 81 78  78  Resp: '15 15  16  '$ Temp:  98.3 F (36.8  C)    TempSrc:  Oral    SpO2:  96%  97%  Weight:   83.1 kg   Height:       Elderly adult female, lying in bed, no acute distress, interactive, still has bandage on the bridge of her nose RRR, no murmurs, still has a lot of pitting peripheral edema in the legs, no significant tense ascites Respiratory rate normal, lungs clear without rales or  wheezes Abdomen soft, no significant ascites, she may have some additional fluid in the abdomen, no tenderness or guarding Attention normal, mild psychomotor slowing, face symmetric, speech slow but fluent, moves all extremities with generalized weakness but symmetric strength   Data Reviewed: Hemoglobin down to 7.2 White blood cell count up to 16 Sodium up to 133, BUN down to 44, creatinine down to 2.75, magnesium normal Chest x-ray clear Blood cultures no growth to date  Family Communication: Son at the bedside    Disposition: Status is: Inpatient Patient was admitted for edema, she was started on diuretics but her renal function worsened and so she is now started on dialysis.  In the meantime her this illness has unmasked her cirrhosis.        Author: Edwin Dada, MD 12/10/2021 2:40 PM  For on call review [email or web address].

## 2021-12-10 NOTE — Progress Notes (Signed)
Received patient in bed to unit.  Alert and oriented.  Informed consent signed and in  chart.   Treatment initiated: 2010 Treatment completed: 1114  Patient tolerated well.  Transported back to the room  alert, without acute distress.  Hand-off given to patient's nurse.   Access used: Catheter Access issues: none  Total UF removed: 500cc Medication(s) given: Albumin Post HD VS: 98.3, 108/47 (66), HR-81, RR-15, SP02-96 Post HD weight: 83.1kg   Lanora Manis

## 2021-12-11 DIAGNOSIS — G9341 Metabolic encephalopathy: Secondary | ICD-10-CM | POA: Diagnosis not present

## 2021-12-11 DIAGNOSIS — N170 Acute kidney failure with tubular necrosis: Secondary | ICD-10-CM | POA: Diagnosis not present

## 2021-12-11 DIAGNOSIS — N184 Chronic kidney disease, stage 4 (severe): Secondary | ICD-10-CM | POA: Diagnosis not present

## 2021-12-11 DIAGNOSIS — I25110 Atherosclerotic heart disease of native coronary artery with unstable angina pectoris: Secondary | ICD-10-CM

## 2021-12-11 DIAGNOSIS — K7682 Hepatic encephalopathy: Secondary | ICD-10-CM

## 2021-12-11 LAB — COMPREHENSIVE METABOLIC PANEL
ALT: 36 U/L (ref 0–44)
AST: 93 U/L — ABNORMAL HIGH (ref 15–41)
Albumin: 2.3 g/dL — ABNORMAL LOW (ref 3.5–5.0)
Alkaline Phosphatase: 80 U/L (ref 38–126)
Anion gap: 6 (ref 5–15)
BUN: 32 mg/dL — ABNORMAL HIGH (ref 8–23)
CO2: 28 mmol/L (ref 22–32)
Calcium: 8 mg/dL — ABNORMAL LOW (ref 8.9–10.3)
Chloride: 100 mmol/L (ref 98–111)
Creatinine, Ser: 2.57 mg/dL — ABNORMAL HIGH (ref 0.44–1.00)
GFR, Estimated: 18 mL/min — ABNORMAL LOW (ref >60)
Glucose, Bld: 109 mg/dL — ABNORMAL HIGH (ref 70–99)
Potassium: 3.4 mmol/L — ABNORMAL LOW (ref 3.5–5.1)
Sodium: 134 mmol/L — ABNORMAL LOW (ref 135–145)
Total Bilirubin: 1.4 mg/dL — ABNORMAL HIGH (ref 0.3–1.2)
Total Protein: 5.4 g/dL — ABNORMAL LOW (ref 6.5–8.1)

## 2021-12-11 LAB — PREPARE RBC (CROSSMATCH)

## 2021-12-11 LAB — CBC
HCT: 20 % — ABNORMAL LOW (ref 36.0–46.0)
Hemoglobin: 6.8 g/dL — ABNORMAL LOW (ref 12.0–15.0)
MCH: 30.6 pg (ref 26.0–34.0)
MCHC: 34 g/dL (ref 30.0–36.0)
MCV: 90.1 fL (ref 80.0–100.0)
Platelets: 144 10*3/uL — ABNORMAL LOW (ref 150–400)
RBC: 2.22 MIL/uL — ABNORMAL LOW (ref 3.87–5.11)
RDW: 20.7 % — ABNORMAL HIGH (ref 11.5–15.5)
WBC: 12.7 10*3/uL — ABNORMAL HIGH (ref 4.0–10.5)
nRBC: 0.6 % — ABNORMAL HIGH (ref 0.0–0.2)

## 2021-12-11 LAB — MAGNESIUM: Magnesium: 1.9 mg/dL (ref 1.7–2.4)

## 2021-12-11 LAB — PROTIME-INR
INR: 1.6 — ABNORMAL HIGH (ref 0.8–1.2)
Prothrombin Time: 18.8 seconds — ABNORMAL HIGH (ref 11.4–15.2)

## 2021-12-11 MED ORDER — SODIUM CHLORIDE 0.9% FLUSH
10.0000 mL | Freq: Two times a day (BID) | INTRAVENOUS | Status: DC
  Administered 2021-12-11 – 2021-12-12 (×4): 10 mL
  Administered 2021-12-13: 20 mL
  Administered 2021-12-13 – 2021-12-14 (×3): 10 mL
  Administered 2021-12-15: 20 mL
  Administered 2021-12-15 – 2021-12-19 (×8): 10 mL

## 2021-12-11 MED ORDER — SODIUM CHLORIDE 0.9% FLUSH: 10.0000 mL | INTRAVENOUS | Status: DC | PRN

## 2021-12-11 MED ORDER — SODIUM CHLORIDE 0.9% IV SOLUTION: Freq: Once | INTRAVENOUS | Status: AC

## 2021-12-11 NOTE — Progress Notes (Signed)
PIV consult: Pt with edema to B forearms, L medial AC area red/ bruised. Blood infusing via femoral line pigtail. For vessel preservation, recommend holding off on PIVs while pt has this access. Femoral site dressing soiled, changed with assistance from [place], Therapist, sports. Mild redness noted, no drainage or induration.

## 2021-12-11 NOTE — Progress Notes (Signed)
[facility]  PROGRESS NOTE   Subjective:   Patient seen at bedside.  She feels much better today.  Objective:  Vital signs: Blood pressure (!) 125/54, pulse 73, temperature 98 F (36.7 C), resp. rate 16, height 5' (1.524 m), weight 83.1 kg, last menstrual period 05/01/1972, SpO2 96 %.  Intake/Output Summary (Last 24 hours) at 12/11/2021 2209 Last data filed at 12/11/2021 1715 Gross per 24 hour  Intake 370 ml  Output 800 ml  Net -430 ml   Filed Weights   12/10/21 0445 12/10/21 0843 12/10/21 1135  Weight: 88 kg 84 kg 83.1 kg     Physical Exam: General:  No acute distress  Head:  Normocephalic, atraumatic. Moist oral mucosal membranes  Eyes:  Anicteric  Neck:  Supple  Lungs:   Clear to auscultation, normal effort  Heart:  S1S2 no rubs  Abdomen:   Soft, nontender, bowel sounds present  Extremities:  peripheral edema.  Neurologic:  Awake, alert, following commands  Skin:  No lesions  Access:     Basic Metabolic Panel: Recent Labs  Lab 12/07/21 0444 12/08/21 0659 12/09/21 0638 12/10/21 0555 12/11/21 0434  NA 131* 128* 129* 133* 134*  K 4.5 4.4 4.7 4.1 3.4*  CL 101 97* 98 100 100  CO2 '24 24 23 24 28  '$ GLUCOSE 87 92 110* 100* 109*  BUN 53* 53* 54* 44* 32*  CREATININE 3.34* 3.53* 3.16* 2.75* 2.57*  CALCIUM 8.1* 8.2* 8.7* 8.3* 8.0*  MG 2.2 2.4 2.4 2.2 1.9    CBC: Recent Labs  Lab 12/07/21 0444 12/08/21 0659 12/09/21 0638 12/10/21 0555 12/11/21 0434  WBC 8.6 9.2 12.4* 16.1* 12.7*  HGB 7.6* 7.6* 8.6* 7.2* 6.8*  HCT 22.2* 22.0* 24.9* 20.7* 20.0*  MCV 87.1 88.4 86.5 88.5 90.1  PLT 193 199 213 159 144*     Urinalysis: No results for input(s): "COLORURINE", "LABSPEC", "PHURINE", "GLUCOSEU", "HGBUR", "BILIRUBINUR", "KETONESUR", "PROTEINUR", "UROBILINOGEN", "NITRITE", "LEUKOCYTESUR" in the last 72 hours.  Invalid input(s): "APPERANCEUR"    Imaging: No results found.   Medications:    sodium chloride Stopped (12/03/21 1136)   promethazine  (PHENERGAN) injection (IM or IVPB) 12.5 mg (12/05/21 1301)    amiodarone  200 mg Oral Daily   Chlorhexidine Gluconate Cloth  6 each Topical Q0600   darbepoetin (ARANESP) injection - NON-DIALYSIS  100 mcg Subcutaneous Q Fri-1800   docusate sodium  100 mg Oral BID   heparin injection (subcutaneous)  5,000 Units Subcutaneous Q8H   lactulose  20 g Oral BID   lidocaine  2 patch Transdermal Q24H   pantoprazole  40 mg Oral BID   rifaximin  200 mg Oral BID   sodium chloride flush  10-40 mL Intracatheter Q12H    Assessment/ Plan:     Principal Problem:   Acute renal failure superimposed on stage 4 chronic kidney disease (HCC) Active Problems:   Essential hypertension, benign   Hyperlipidemia   OSA on CPAP   Morbid obesity ([place])   Coronary artery disease involving native coronary artery of native heart with unstable angina pectoris (HCC)   Paroxysmal atrial fibrillation (HCC)   Chronic obstructive pulmonary disease, unspecified COPD type (HCC)   Chronic diastolic CHF (congestive heart failure) (HCC)   Hypokalemia   Chronic kidney disease (CKD), stage IV (severe) (HCC)   Hyponatremia   Anemia of chronic kidney failure, stage 4 (severe) (HCC)   Edema and ascites    Acute metabolic encephalopathy   Acute hepatic encephalopathy (HCC)   Cirrhosis (HCC)  [name] is a 81 y.o.  female with past medical history including diastolic CHF, anemia, CAD, paroxysmal atrial fibrillation without anticoagulation.  GI bleed.  Chronic kidney disease stage IV.  Patient reports to ED with complaints of lower extremity edema.  Patient has been admitted for Acute CHF (congestive heart failure) ([place]) [I50.9] Acute decompensated heart failure ([place]) [I50.9]   #1: Acute kidney injury on CKD: Patient was started on dialysis secondary to fluid overload and mental status changes.  Mentation has improved somewhat.  Will reevaluate for dialysis in a.m.   #2: Anemia: We will continue anemia protocols.   Continue darbepoetin.   #3: Congestive heart failure: We will continue dialysis with fluid removal as tolerated.  Patient is advised to stay on fluid restriction.  We will follow closely.   We will continue to monitor closely   LOS: [place], MD [facility] 8/13/202310:09 PM

## 2021-12-11 NOTE — Progress Notes (Signed)
Progress Note  Patient Name: April [name] Date of Encounter: 12/11/2021  [facility] Cardiologist: Ida Rogue, MD   Subjective   Feeling poorly.  She had nausea and emesis.  Breathing is still somewhat short but improving.  Inpatient Medications    Scheduled Meds:  sodium chloride   Intravenous Once   amiodarone  200 mg Oral Daily   Chlorhexidine Gluconate Cloth  6 each Topical Q0600   darbepoetin (ARANESP) injection - NON-DIALYSIS  100 mcg Subcutaneous Q Fri-1800   docusate sodium  100 mg Oral BID   heparin injection (subcutaneous)  5,000 Units Subcutaneous Q8H   lactulose  20 g Oral BID   lidocaine  2 patch Transdermal Q24H   pantoprazole  40 mg Oral BID   rifaximin  200 mg Oral BID   Continuous Infusions:  sodium chloride Stopped (12/03/21 1136)   promethazine (PHENERGAN) injection (IM or IVPB) 12.5 mg (12/05/21 1301)   PRN Meds: sodium chloride, acetaminophen, diclofenac Sodium, nitroGLYCERIN, ondansetron (ZOFRAN) IV, promethazine (PHENERGAN) injection (IM or IVPB)   Vital Signs    Vitals:   12/10/21 1944 12/10/21 2340 12/11/21 0432 12/11/21 0844  BP: (!) 112/50 (!) 106/44 (!) 106/48 (!) 117/51  Pulse: 74 70 64 65  Resp: '18 20 16 16  '$ Temp: 97.9 F (36.6 C) 97.9 F (36.6 C) 97.7 F (36.5 C) 97.8 F (36.6 C)  TempSrc:    Oral  SpO2: 96% 95% 95% 96%  Weight:      Height:        Intake/Output Summary (Last 24 hours) at 12/11/2021 1218 Last data filed at 12/11/2021 1139 Gross per 24 hour  Intake 941.83 ml  Output 200 ml  Net 741.83 ml      12/10/2021   11:35 AM 12/10/2021    8:43 AM 12/10/2021    4:45 AM  Last 3 Weights  Weight (lbs) 183 lb 3.2 oz 185 lb 3 oz 194 lb 0.1 oz  Weight (kg) 83.1 kg 84 kg 88 kg      Telemetry    Sinus rhythm.  First-degree AV block.  PVCs.- Personally Reviewed  ECG    N/a - Personally Reviewed  Physical Exam   VS:  BP (!) 117/51 (BP Location: Left Arm)   Pulse 65   Temp 97.8 F (36.6 C) (Oral)   Resp  16   Ht 5' (1.524 m)   Wt 83.1 kg   LMP 05/01/1972   SpO2 96%   BMI 35.78 kg/m  , BMI Body mass index is 35.78 kg/m. GENERAL:  Well appearing on CPAP.  Lying comfortably flat in bed. HEENT: Pupils equal round and reactive, fundi not visualized, oral mucosa unremarkable NECK:  No jugular venous distention, waveform within normal limits, carotid upstroke brisk and symmetric, no bruits, no thyromegaly LUNGS:  Clear to auscultation bilaterally HEART:  RRR.  PMI not displaced or sustained,S1 and S2 within normal limits, no S3, no S4, no clicks, no rubs, no murmurs ABD:  Flat, positive bowel sounds normal in frequency in pitch, no bruits, no rebound, no guarding, no midline pulsatile mass, no hepatomegaly, no splenomegaly EXT:  2 plus pulses throughout, 2+ LE edema to lower tibia bilaterally. No cyanosis no clubbing SKIN:  No rashes no nodules NEURO:  Cranial nerves II through XII grossly intact, motor grossly intact throughout PSYCH:  Cognitively intact, oriented to person place and time   Labs    High Sensitivity Troponin:   Recent Labs  Lab 11/23/21 1514 11/23/21 1714  TROPONINIHS 18* 21*     Chemistry Recent Labs  Lab 12/09/21 0638 12/10/21 0555 12/11/21 0434  NA 129* 133* 134*  K 4.7 4.1 3.4*  CL 98 100 100  CO2 '23 24 28  '$ GLUCOSE 110* 100* 109*  BUN 54* 44* 32*  CREATININE 3.16* 2.75* 2.57*  CALCIUM 8.7* 8.3* 8.0*  MG 2.4 2.2 1.9  PROT  --   --  5.4*  ALBUMIN  --   --  2.3*  AST  --   --  93*  ALT  --   --  36  ALKPHOS  --   --  80  BILITOT  --   --  1.4*  GFRNONAA 14* 17* 18*  ANIONGAP '8 9 6    '$ Lipids No results for input(s): "CHOL", "TRIG", "HDL", "LABVLDL", "LDLCALC", "CHOLHDL" in the last 168 hours.  Hematology Recent Labs  Lab 12/09/21 1610 12/10/21 0555 12/11/21 0434  WBC 12.4* 16.1* 12.7*  RBC 2.88* 2.34* 2.22*  HGB 8.6* 7.2* 6.8*  HCT 24.9* 20.7* 20.0*  MCV 86.5 88.5 90.1  MCH 29.9 30.8 30.6  MCHC 34.5 34.8 34.0  RDW 19.4* 20.3* 20.7*  PLT  213 159 144*   Thyroid No results for input(s): "TSH", "FREET4" in the last 168 hours.  BNPNo results for input(s): "BNP", "PROBNP" in the last 168 hours.  DDimer No results for input(s): "DDIMER" in the last 168 hours.   Radiology    No results found.  Cardiac Studies   Echocardiogram completed 08/10/2021 1. Left ventricular ejection fraction, by estimation, is 60 to 65%. The  left ventricle has normal function. The left ventricle has no regional  wall motion abnormalities. Left ventricular diastolic parameters are  consistent with Grade II diastolic  dysfunction (pseudonormalization).   2. Right ventricular systolic function is normal. The right ventricular  size is mildly enlarged. There is mildly elevated pulmonary artery  systolic pressure. The estimated right ventricular systolic pressure is  96.0 mmHg.   3. Left atrial size was mildly dilated.   4. Right atrial size was mildly dilated.   5. The mitral valve is normal in structure. Mild mitral valve  regurgitation.   6. Tricuspid valve regurgitation is moderate.   7. The aortic valve is grossly normal. Aortic valve regurgitation is not  visualized.   8. The inferior vena cava is normal in size with greater than 50%  respiratory variability, suggesting right atrial pressure of 3 mmHg.    LHC 03/2016 Prox RCA lesion, 80 %stenosed. A STENT PROMUS PREM MR 3.5X16 (3.8-4.1 mm) drug eluting stent was successfully placed. Post intervention, there is a 0% residual stenosis. Ost RPDA lesion, 90 %stenosed. A STENT PROMUS PREM MR 2.5X12 (2.7 mm) drug eluting stent was successfully placed. Post intervention, there is a 0% residual stenosis. __________________________________ Prox RCA to Mid RCA lesion, 50 %stenosed. Dist RCA lesion, 30 %stenosed.- Appears smooth and chronic Dist LAD lesion, 25 %stenosed - diffuse disease in the distal LAD. Nonsignificant The left ventricular systolic function is normal. The left ventricular ejection  fraction is 55-65% by visual estimate. There is no mitral valve regurgitation. There is no aortic valve stenosis.   Thankfully, the patient presented chest pain-free indicating likely resolution of 100% occluded vessel. This was found to be the case with a thrombotic 80% proximal RCA as well as a very focal 90% ostial PDA lesion. Both these were treated with DES stents as non-Primary PCI. The digital mid RCA 50% stenosis was relatively smooth and appears to be  chronic. This will be treated medically.   Preserved EF with mildly elevated LVEDP. Severe systemic hypertension noted.   Plan: Admit to TCU, step down cardiac unit for post MI/PCI care. TR band removal per protocol Continue Aggrastat infusion until current bag complete Aspirin plus Brilinta for minimum 3 months. At which time could consider stopping aspirin. Restart home dose of carvedilol and losartan.  As she is on furosemide, would hold HCTZ. Increase home Crestor dose to 40 mg.   Expect Fast-Track Discharge    Coronary Diagrams   Diagnostic Dominance: Right Intervention        Patient Profile     81 y.o. female with CAD s/p PCI, PAF, h/o GI bleed, HTN, DM, CKD, admitted with acute on chronic diastolic heart failure.  Assessment & Plan    # HFpEF:  Renal function worsened with diuresis.  Volume is being managed with dialysis.  She had sessions on 8/11 and 8/12.  Her lower extremity edema is improving.  # CAD: # HL: S/p RCA PCI. Not on a statin 2/2 cirrhosis.  She is not on aspirin due to GI bleed and anemia.  No blocker due to hypotension.  She has no angina.    # PAF:  She is not on anticoagulation 2/2 h/o GI bleed.  She is getting transfused today.  She is maintaining sinus rhythm on amiodarone.  # Hypertension:  BP well-controlled off antihypertensives.  # Acute on chronic renal failure: Tunneled catheter placed and she started HD this admission.  # OSA:  On CPAP:  # Cirrhosis:  # Hepatic  encephalopathy: Ammonia was 159.  She was started on lactulose and rifaximin with improvement in her mental clarity.      For questions or updates, please contact [place] Please consult [email or web address] for contact info under        Signed, Skeet Latch, MD  12/11/2021, 12:18 PM

## 2021-12-11 NOTE — Progress Notes (Signed)
  Progress Note   Patient: April [name] HCW:[id number] DOB: Jun 16, 1940 DOA: 11/23/2021     17 DOS: the patient was seen and examined on 12/11/2021        Brief hospital course: 81 yo F with dCHF, AF on amiodarone no AC, CKD IV baseline 2-2.4 who presented for abdominal and leg swelling and weight gain.   Patient was initially thought to have CHF or nephrogenic edema, and so Nephrology were consulted and she was given escalating doses of Lasix.  Ultimately she developed hepatic encephalopathy, and it became clear her weight gain was ascites and hepatic swelling was a major contributor.   In the meantime, she failed to respond to diuretics, and her renal function worsened and she was started on dialysis.     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   Hypokalemia Mild - HD  Paroxysmal atrial fibrillation (HCC) Not on anticoagulation due to anemia, GI bleeidng - Continue amiodarone          Subjective: The patient is having some swelling in the left elbow today.  This is not painful or warm.  She has mild confusion.  She has no dyspnea.  Her swelling is unchanged.     Physical Exam: Vitals:   12/11/21 0432 12/11/21 0844 12/11/21 1409 12/11/21 1425  BP: (!) 106/48 (!) 117/51 (!) 133/54 (!) 123/48  Pulse: 64 65 69 71  Resp: '16 16 20 20  '$ Temp: 97.7 F (36.5 C) 97.8 F (36.6 C) 97.9 F (36.6 C) 97.6 F (36.4 C)   TempSrc:  Oral Oral   SpO2: 95% 96% 95%   Weight:      Height:       Elderly adult female, lying in bed, no acute distress RRR, soft systolic murmur, pitting edema diffusely Respiratory rate normal, lung sounds diminished, no rales or wheezes appreciated Abdomen soft no tenderness palpation or guarding, no ascites or distention Attention distracted, affect blunted, judgment and insight appear mildly impaired, psychomotor slowing is mild She has some mild edema from where an IV infiltrated in her left antecubital area     Data Reviewed: Basic metabolic panel shows creatinine of 2.57, total bilirubin 1.4 INR 1.6 Magnesium normal Hemoglobin down to 6.8   Disposition: Status is: Inpatient         Author: Edwin Dada, MD 12/11/2021 4:32 PM  For on call review [email or web address].

## 2021-12-12 ENCOUNTER — Inpatient Hospital Stay: Payer: Medicare Other

## 2021-12-12 DIAGNOSIS — I5032 Chronic diastolic (congestive) heart failure: Secondary | ICD-10-CM | POA: Diagnosis not present

## 2021-12-12 DIAGNOSIS — I2510 Atherosclerotic heart disease of native coronary artery without angina pectoris: Secondary | ICD-10-CM | POA: Diagnosis not present

## 2021-12-12 DIAGNOSIS — N179 Acute kidney failure, unspecified: Secondary | ICD-10-CM | POA: Diagnosis not present

## 2021-12-12 DIAGNOSIS — N184 Chronic kidney disease, stage 4 (severe): Secondary | ICD-10-CM | POA: Diagnosis not present

## 2021-12-12 DIAGNOSIS — N170 Acute kidney failure with tubular necrosis: Secondary | ICD-10-CM | POA: Diagnosis not present

## 2021-12-12 DIAGNOSIS — I480 Paroxysmal atrial fibrillation: Secondary | ICD-10-CM | POA: Diagnosis not present

## 2021-12-12 LAB — RENAL FUNCTION PANEL
Albumin: 2.2 g/dL — ABNORMAL LOW (ref 3.5–5.0)
Anion gap: 8 (ref 5–15)
BUN: 43 mg/dL — ABNORMAL HIGH (ref 8–23)
CO2: 25 mmol/L (ref 22–32)
Calcium: 8.3 mg/dL — ABNORMAL LOW (ref 8.9–10.3)
Chloride: 99 mmol/L (ref 98–111)
Creatinine, Ser: 2.92 mg/dL — ABNORMAL HIGH (ref 0.44–1.00)
GFR, Estimated: 15 mL/min — ABNORMAL LOW (ref >60)
Glucose, Bld: 100 mg/dL — ABNORMAL HIGH (ref 70–99)
Phosphorus: 3.6 mg/dL (ref 2.5–4.6)
Potassium: 3.2 mmol/L — ABNORMAL LOW (ref 3.5–5.1)
Sodium: 132 mmol/L — ABNORMAL LOW (ref 135–145)

## 2021-12-12 LAB — CBC
HCT: 23.9 % — ABNORMAL LOW (ref 36.0–46.0)
Hemoglobin: 7.8 g/dL — ABNORMAL LOW (ref 12.0–15.0)
MCH: 29.3 pg (ref 26.0–34.0)
MCHC: 32.6 g/dL (ref 30.0–36.0)
MCV: 89.8 fL (ref 80.0–100.0)
Platelets: 133 10*3/uL — ABNORMAL LOW (ref 150–400)
RBC: 2.66 MIL/uL — ABNORMAL LOW (ref 3.87–5.11)
RDW: 19.1 % — ABNORMAL HIGH (ref 11.5–15.5)
WBC: 10.6 10*3/uL — ABNORMAL HIGH (ref 4.0–10.5)
nRBC: 0.4 % — ABNORMAL HIGH (ref 0.0–0.2)

## 2021-12-12 LAB — TYPE AND SCREEN
ABO/RH(D): A POS
Antibody Screen: NEGATIVE
Unit division: 0

## 2021-12-12 LAB — HEPATITIS B E ANTIBODY: Hep B E Ab: NEGATIVE

## 2021-12-12 LAB — BPAM RBC
Blood Product Expiration Date: 202309062359
ISSUE DATE / TIME: 202308131358
Unit Type and Rh: 6200

## 2021-12-12 LAB — MAGNESIUM: Magnesium: 2 mg/dL (ref 1.7–2.4)

## 2021-12-12 NOTE — Progress Notes (Signed)
Progress Note  Patient Name: April [name] Date of Encounter: 12/12/2021  [facility] Cardiologist: Ida Rogue, MD   Subjective   Patient seen on AM rounds. Denies any chest pain but continues to endorse wheezing and dyspnea on exertion that she states is not a new issue. Peripheral edema has improved. - 580 in the last 24 hours.   Inpatient Medications    Scheduled Meds:  amiodarone  200 mg Oral Daily   Chlorhexidine Gluconate Cloth  6 each Topical Q0600   darbepoetin (ARANESP) injection - NON-DIALYSIS  100 mcg Subcutaneous Q Fri-1800   docusate sodium  100 mg Oral BID   heparin injection (subcutaneous)  5,000 Units Subcutaneous Q8H   lactulose  20 g Oral BID   lidocaine  2 patch Transdermal Q24H   pantoprazole  40 mg Oral BID   rifaximin  200 mg Oral BID   sodium chloride flush  10-40 mL Intracatheter Q12H   Continuous Infusions:  sodium chloride Stopped (12/03/21 1136)   promethazine (PHENERGAN) injection (IM or IVPB) 12.5 mg (12/05/21 1301)   PRN Meds: sodium chloride, acetaminophen, diclofenac Sodium, nitroGLYCERIN, ondansetron (ZOFRAN) IV, promethazine (PHENERGAN) injection (IM or IVPB), sodium chloride flush   Vital Signs    Vitals:   12/11/21 2000 12/11/21 2257 12/12/21 0337 12/12/21 0730  BP: (!) 125/54 (!) 123/53 (!) 107/50 (!) 115/46  Pulse: 73 78 66 67  Resp: '16 20 18 16  '$ Temp: 98 F (36.7 C) 98 F (36.7 C) 98 F (36.7 C) 98.9 F (37.2 C)  TempSrc:      SpO2: 96% 97% 98% 97%  Weight:      Height:        Intake/Output Summary (Last 24 hours) at 12/12/2021 0839 Last data filed at 12/12/2021 0300 Gross per 24 hour  Intake 370 ml  Output 950 ml  Net -580 ml      12/10/2021   11:35 AM 12/10/2021    8:43 AM 12/10/2021    4:45 AM  Last 3 Weights  Weight (lbs) 183 lb 3.2 oz 185 lb 3 oz 194 lb 0.1 oz  Weight (kg) 83.1 kg 84 kg 88 kg      Telemetry     Sinus with first degree AV block and chronic LBBB rates of 60-70's- Personally  Reviewed  ECG    No new tracings - Personally Reviewed  Physical Exam   GEN: No acute distress. Resting with CPAP on , patient removed CPAP during exam Neck: No JVD Cardiac: RRR, no murmurs, rubs, or gallops.  Respiratory: Expiratory wheezing noted throughout to auscultation bilaterally. Respirations are unlabored at rest GI: Soft, nontender, non-distended  MS: 2+ edema; No deformity. Neuro:  Nonfocal  Psych: Normal affect   Labs    High Sensitivity Troponin:   Recent Labs  Lab 11/23/21 1514 11/23/21 1714  TROPONINIHS 18* 21*     Chemistry Recent Labs  Lab 12/10/21 0555 12/11/21 0434 12/12/21 0540  NA 133* 134* 132*  K 4.1 3.4* 3.2*  CL 100 100 99  CO2 '24 28 25  '$ GLUCOSE 100* 109* 100*  BUN 44* 32* 43*  CREATININE 2.75* 2.57* 2.92*  CALCIUM 8.3* 8.0* 8.3*  MG 2.2 1.9 2.0  PROT  --  5.4*  --   ALBUMIN  --  2.3* 2.2*  AST  --  93*  --   ALT  --  36  --   ALKPHOS  --  80  --   BILITOT  --  1.4*  --  GFRNONAA 17* 18* 15*  ANIONGAP '9 6 8    '$ Lipids No results for input(s): "CHOL", "TRIG", "HDL", "LABVLDL", "LDLCALC", "CHOLHDL" in the last 168 hours.  Hematology Recent Labs  Lab 12/10/21 0555 12/11/21 0434 12/12/21 0540  WBC 16.1* 12.7* 10.6*  RBC 2.34* 2.22* 2.66*  HGB 7.2* 6.8* 7.8*  HCT 20.7* 20.0* 23.9*  MCV 88.5 90.1 89.8  MCH 30.8 30.6 29.3  MCHC 34.8 34.0 32.6  RDW 20.3* 20.7* 19.1*  PLT 159 144* 133*   Thyroid No results for input(s): "TSH", "FREET4" in the last 168 hours.  BNPNo results for input(s): "BNP", "PROBNP" in the last 168 hours.  DDimer No results for input(s): "DDIMER" in the last 168 hours.   Radiology    No results found.  Cardiac Studies   Echocardiogram completed 08/10/2021 1. Left ventricular ejection fraction, by estimation, is 60 to 65%. The  left ventricle has normal function. The left ventricle has no regional  wall motion abnormalities. Left ventricular diastolic parameters are  consistent with Grade II diastolic   dysfunction (pseudonormalization).   2. Right ventricular systolic function is normal. The right ventricular  size is mildly enlarged. There is mildly elevated pulmonary artery  systolic pressure. The estimated right ventricular systolic pressure is  46.5 mmHg.   3. Left atrial size was mildly dilated.   4. Right atrial size was mildly dilated.   5. The mitral valve is normal in structure. Mild mitral valve  regurgitation.   6. Tricuspid valve regurgitation is moderate.   7. The aortic valve is grossly normal. Aortic valve regurgitation is not  visualized.   8. The inferior vena cava is normal in size with greater than 50%  respiratory variability, suggesting right atrial pressure of 3 mmHg.    LHC 03/2016 Prox RCA lesion, 80 %stenosed. A STENT PROMUS PREM MR 3.5X16 (3.8-4.1 mm) drug eluting stent was successfully placed. Post intervention, there is a 0% residual stenosis. Ost RPDA lesion, 90 %stenosed. A STENT PROMUS PREM MR 2.5X12 (2.7 mm) drug eluting stent was successfully placed. Post intervention, there is a 0% residual stenosis. __________________________________ Prox RCA to Mid RCA lesion, 50 %stenosed. Dist RCA lesion, 30 %stenosed.- Appears smooth and chronic Dist LAD lesion, 25 %stenosed - diffuse disease in the distal LAD. Nonsignificant The left ventricular systolic function is normal. The left ventricular ejection fraction is 55-65% by visual estimate. There is no mitral valve regurgitation. There is no aortic valve stenosis.   Thankfully, the patient presented chest pain-free indicating likely resolution of 100% occluded vessel. This was found to be the case with a thrombotic 80% proximal RCA as well as a very focal 90% ostial PDA lesion. Both these were treated with DES stents as non-Primary PCI. The digital mid RCA 50% stenosis was relatively smooth and appears to be chronic. This will be treated medically.   Preserved EF with mildly elevated LVEDP. Severe systemic  hypertension noted.   Plan: Admit to TCU, step down cardiac unit for post MI/PCI care. TR band removal per protocol Continue Aggrastat infusion until current bag complete Aspirin plus Brilinta for minimum 3 months. At which time could consider stopping aspirin. Restart home dose of carvedilol and losartan.  As she is on furosemide, would hold HCTZ. Increase home Crestor dose to 40 mg.   Expect Fast-Track Discharge    Coronary Diagrams   Diagnostic Dominance: Right Intervention      Patient Profile     81 y.o. female with CAD s/p PCI, PAF not an  anticoagulant, h/o GI bleed, HTN, HLD, DM, CKD, who is being seen and evaluated for chronic diastolic heart failure.  Assessment & Plan    HFpEF - -580 in the last 24 hours - fluid removal managed by dialysis - worsening renal function with diuresis -shortness of breath improving, continues to wheeze -daily weight, I&O, low sodium diet  CAD -s/p RCA PCI - unable to tolerate statin therapy 2/2 cirrhosis - unable to tolerate asa due to h/o GI bleed -currently no beta blocker due to recent hypotension  PAF - currently in sinus rhythm - continue amiodarone - no anticoagulation 2/2 h/o GI bleed  Hypertension - blood pressure 115/46 - currently off all antihypertensives - vital signs per un it protocol  OSA - on CPAP  Cirrhosis - ammonia was 159 - continue lactulose - management per IM     For questions or updates, please contact [facility] Please consult [email or web address] for contact info under        Signed, Cleon Thoma, NP  12/12/2021, 8:39 AM

## 2021-12-12 NOTE — Progress Notes (Signed)
PT Cancellation Note  Patient Details Name: RINNAH PEPPEL MRN: [id number] DOB: [birth year]   Cancelled Treatment:     Pt attempted once but on BSC. PT attempted to treat the patient again few minutes later but pt for hemodialysis  treatment. Therefore, pt is unavailable today. Will attempt again tomorrow.    Joaquin Music PT DPT 4:45 PM,12/12/21

## 2021-12-12 NOTE — Progress Notes (Signed)
Received patient in bed to unit.  Alert and oriented.  Informed consent signed and in  chart.    Treatment initiated: 1624 Treatment completed: 1930   Patient tolerated well.  Transported back to the room  alert, without acute distress.  Hand-off given to patient's nurse.    Access used: Catheter Access issues: none Total UF removed: 1000cc Medication(s) given: None  :

## 2021-12-12 NOTE — Progress Notes (Addendum)
[facility]  PROGRESS NOTE   Subjective:   Patiently currently seen sitting up in bed Son at bedside Currently eating breakfast, tolerating well Patient states she feels well today Continues to complain of fatigue  Objective:  Vital signs: Blood pressure (!) 148/68, pulse 79, temperature 98.2 F (36.8 C), resp. rate 13, height 5' (1.524 m), weight 83.1 kg, last menstrual period 05/01/1972, SpO2 98 %.  Intake/Output Summary (Last 24 hours) at 12/12/2021 1527 Last data filed at 12/12/2021 1300 Gross per 24 hour  Intake 370 ml  Output 150 ml  Net 220 ml    Filed Weights   12/10/21 0445 12/10/21 0843 12/10/21 1135  Weight: 88 kg 84 kg 83.1 kg     Physical Exam: General:  No acute distress  Head:  Normocephalic, atraumatic. Moist oral mucosal membranes  Eyes:  Anicteric  Lungs:   Clear to auscultation, normal effort  Heart:  S1S2 no rubs  Abdomen:   Soft, nontender, bowel sounds present  Extremities: 1+ peripheral edema.  Neurologic:  Awake, alert, following commands  Skin:  No lesions  Access: Right femoral temp cath    Basic Metabolic Panel: Recent Labs  Lab 12/08/21 0659 12/09/21 0638 12/10/21 0555 12/11/21 0434 12/12/21 0540  NA 128* 129* 133* 134* 132*  K 4.4 4.7 4.1 3.4* 3.2*  CL 97* 98 100 100 99  CO2 '24 23 24 28 25  '$ GLUCOSE 92 110* 100* 109* 100*  BUN 53* 54* 44* 32* 43*  CREATININE 3.53* 3.16* 2.75* 2.57* 2.92*  CALCIUM 8.2* 8.7* 8.3* 8.0* 8.3*  MG 2.4 2.4 2.2 1.9 2.0  PHOS  --   --   --   --  3.6     CBC: Recent Labs  Lab 12/08/21 0659 12/09/21 0638 12/10/21 0555 12/11/21 0434 12/12/21 0540  WBC 9.2 12.4* 16.1* 12.7* 10.6*  HGB 7.6* 8.6* 7.2* 6.8* 7.8*  HCT 22.0* 24.9* 20.7* 20.0* 23.9*  MCV 88.4 86.5 88.5 90.1 89.8  PLT 199 213 159 144* 133*      Urinalysis: No results for input(s): "COLORURINE", "LABSPEC", "PHURINE", "GLUCOSEU", "HGBUR", "BILIRUBINUR", "KETONESUR", "PROTEINUR", "UROBILINOGEN", "NITRITE",  "LEUKOCYTESUR" in the last 72 hours.  Invalid input(s): "APPERANCEUR"    Imaging: US Abdomen Limited  Result Date: 12/12/2021 CLINICAL DATA:  Abdominal distension and cirrhosis. EXAM: LIMITED ABDOMEN ULTRASOUND FOR ASCITES TECHNIQUE: Limited ultrasound survey for ascites was performed in all four abdominal quadrants. COMPARISON:  11/08/2021.  CT 11/29/2020 FINDINGS: Small volume ascites identified within all 4 quadrants. IMPRESSION: Small volume abdominal ascites. Electronically Signed   By: Abigail Miyamoto M.D.   On: 12/12/2021 13:41     Medications:    sodium chloride Stopped (12/03/21 1136)   promethazine (PHENERGAN) injection (IM or IVPB) 12.5 mg (12/05/21 1301)    amiodarone  200 mg Oral Daily   Chlorhexidine Gluconate Cloth  6 each Topical Q0600   darbepoetin (ARANESP) injection - NON-DIALYSIS  100 mcg Subcutaneous Q Fri-1800   docusate sodium  100 mg Oral BID   heparin injection (subcutaneous)  5,000 Units Subcutaneous Q8H   lactulose  20 g Oral BID   lidocaine  2 patch Transdermal Q24H   pantoprazole  40 mg Oral BID   rifaximin  200 mg Oral BID   sodium chloride flush  10-40 mL Intracatheter Q12H    Assessment/ Plan:     Principal Problem:   Acute renal failure superimposed on stage 4 chronic kidney disease (HCC) Active Problems:   Essential hypertension, benign   Hyperlipidemia  OSA on CPAP   Morbid obesity (HCC)   Coronary artery disease involving native coronary artery of native heart with unstable angina pectoris (HCC)   Paroxysmal atrial fibrillation (HCC)   Chronic obstructive pulmonary disease, unspecified COPD type (HCC)   Chronic diastolic CHF (congestive heart failure) (HCC)   Hypokalemia   Chronic kidney disease (CKD), stage IV (severe) (HCC)   Hyponatremia   Anemia of chronic kidney failure, stage 4 (severe) (HCC)   Edema and ascites    Acute metabolic encephalopathy   Acute hepatic encephalopathy (HCC)   Cirrhosis ([place])  [name] is a 81  y.o.  female with past medical history including diastolic CHF, anemia, CAD, paroxysmal atrial fibrillation without anticoagulation.  GI bleed.  Chronic kidney disease stage IV.  Patient reports to ED with complaints of lower extremity edema.  Patient has been admitted for Acute CHF (congestive heart failure) ([place]) [I50.9] Acute decompensated heart failure ([place]) [I50.9]   #1: Acute kidney injury on CKD: Patient was started on dialysis secondary to fluid overload and mental status changes.  Mentation greatly improved today along with appetite.  Will provide additional dialysis treatment today for fluid removal.  We will continue to determine daily need for dialysis.   #2: Anemia: Hemoglobin remains below target however improved.  We will continue weekly Aranesp subcu injections.  We will continue to monitor   #3: Congestive heart failure: We will continue dialysis with fluid removal as tolerated.      LOS: [facility] 8/14/20233:27 PM

## 2021-12-12 NOTE — Evaluation (Signed)
Occupational Therapy Re-Evaluation Patient Details Name: April [name] MRN: [id number] DOB: [birth year] Today's Date: 12/12/2021   History of Present Illness 81 y.o. female presented to hospital 7/26 with peripheral edema.  Pt admitted with acute on chronic diastolic CHF.  PMH CHF, anemia, sleep apnea, CAD s/p tenting, h/o paroxysmal a-fib (not on anticoagulation secondary to recurrent GI bleed), CKD, spasmodic dysphonia, STEMI, urine incontinence, B breast surgery. Patient was started on dialysis secondary to fluid overload and mental status changes.   Clinical Impression   Pt seen for OT re-evaluation. RN cleared to participate, in light of temporary R groin HD cath still in place. Pt eager to participate and pleasant. Pt completed bed mobility with MIN A and increased time/effort. Fair static balance but requiring increased assist for LB ADL tasks today. Discomfort in sitting with hip flexion in R groin 2/2 HD cath. Pt endorsing 8/10 PRE with bed mobility. Pt completed STS transfer and able to take a few lateral steps EOB with handheld assist and CGA. Pt demonstrates increased need for assist for LB ADL tasks and increased fatigue with limited exertion. Pt will benefit from continued skilled OT services to maximize return to PLOF. Recommendation updated to SNF at this time. Hope to progress to [facility] appropriate with future sessions.      Recommendations for follow up therapy are one component of a multi-disciplinary discharge planning process, led by the attending physician.  Recommendations may be updated based on patient status, additional functional criteria and insurance authorization.   Follow Up Recommendations  Skilled nursing-short term rehab (<3 hours/day)    Assistance Recommended at Discharge Frequent or constant Supervision/Assistance  Patient can return home with the following Assistance with cooking/housework;A little help with bathing/dressing/bathroom;A little help with walking  and/or transfers;Assist for transportation    Functional Status Assessment  Patient has had a recent decline in their functional status and demonstrates the ability to make significant improvements in function in a reasonable and predictable amount of time.  Equipment Recommendations       Recommendations for Other Services       Precautions / Restrictions Precautions Precautions: Fall Precaution Comments: temp R femoral HD cath Restrictions Weight Bearing Restrictions: No      Mobility Bed Mobility Overal bed mobility: Needs Assistance Bed Mobility: Supine to Sit, Sit to Supine     Supine to sit: Min assist, HOB elevated Sit to supine: Min assist        Transfers Overall transfer level: Needs assistance Equipment used: 1 person hand held assist Transfers: Sit to/from Stand Sit to Stand: Min guard           General transfer comment: handheld assist and CGA to stand and take lateral steps EOB      Balance Overall balance assessment: Needs assistance Sitting-balance support: No upper extremity supported, Single extremity supported, Feet supported Sitting balance-Leahy Scale: Fair     Standing balance support: Bilateral upper extremity supported, During functional activity, No upper extremity supported, Single extremity supported Standing balance-Leahy Scale: Fair                             ADL either performed or assessed with clinical judgement   ADL Overall ADL's : Needs assistance/impaired                     Lower Body Dressing: Sitting/lateral leans;Moderate assistance Lower Body Dressing Details (indicate cue type and reason): MAX A to  don shoes while seated EOB                     Vision         Perception     Praxis      Pertinent Vitals/Pain Pain Assessment Pain Assessment: No/denies pain     Hand Dominance     Extremity/Trunk Assessment Upper Extremity Assessment Upper Extremity Assessment:  Generalized weakness   Lower Extremity Assessment Lower Extremity Assessment: Generalized weakness       Communication     Cognition Arousal/Alertness: Awake/alert Behavior During Therapy: WFL for tasks assessed/performed Overall Cognitive Status: Within Functional Limits for tasks assessed                                       General Comments       Exercises     Shoulder Instructions      Home Living                                          Prior Functioning/Environment                          OT Problem List: Decreased strength;Decreased activity tolerance;Decreased knowledge of use of DME or AE;Decreased range of motion;Pain;Impaired balance (sitting and/or standing)      OT Treatment/Interventions: Self-care/ADL training;Energy conservation;Therapeutic exercise;DME and/or AE instruction;Therapeutic activities;Patient/family education;Balance training    OT Goals(Current goals can be found in the care plan section) Acute Rehab OT Goals Patient Stated Goal: get better and be independent OT Goal Formulation: With patient Time For Goal Achievement: 12/26/21 Potential to Achieve Goals: Good  OT Frequency: Min 2X/week    Co-evaluation              AM-PAC OT "6 Clicks" Daily Activity     Outcome Measure Help from another person eating meals?: None Help from another person taking care of personal grooming?: None Help from another person toileting, which includes using toliet, bedpan, or urinal?: A Little Help from another person bathing (including washing, rinsing, drying)?: A Little Help from another person to put on and taking off regular upper body clothing?: None Help from another person to put on and taking off regular lower body clothing?: A Lot 6 Click Score: 20   End of Session    Activity Tolerance: Patient tolerated treatment well Patient left: in bed;with call bell/phone within reach;with bed alarm  set  OT Visit Diagnosis: Muscle weakness (generalized) (M62.81)                Time: 0272-5366 OT Time Calculation (min): 14 min Charges:  OT General Charges $OT Visit: 1 Visit OT Evaluation $OT Re-eval: 1 Re-eval  Ardeth Perfect., MPH, MS, OTR/L ascom [phone] 12/12/21, 12:05 PM

## 2021-12-12 NOTE — Progress Notes (Addendum)
Progress Note   Patient: April [name] OFB:[id number] DOB: 11/12/1940 DOA: 11/23/2021     18 DOS: the patient was seen and examined on 12/12/2021 at 12:45PM      Brief hospital course: 81 yo F with dCHF, AF on amiodarone no AC, CKD IV baseline 2-2.4 who presented for abdominal and leg swelling and weight gain.   Patient was initially thought to have CHF or nephrogenic edema, and so Nephrology were consulted and she was given escalating doses of Lasix.  Ultimately she developed hepatic encephalopathy, and it became clear her weight gain was ascites and hepatic swelling was a major contributor.   In the meantime, she failed to respond to diuretics, and her renal function worsened and she was started on dialysis.     Assessment and Plan: * Acute renal failure superimposed on stage 4 chronic kidney disease (HCC) S/p tunneled catheter 8/10  HD #1 8/11 HD #2 8/12 Minimal UOP again 200cc last 24  HD #3 8/14 planned - HD per nephrology   Anemia of chronic kidney failure, stage 4 (severe) (HCC) Patient's Hgb has been trending down steadily over the last few months.  Certainly this is multifactorial with renal failure driving it, and chronic GI blood loss contribtuing.  Iron studies in June showed ferritin 24.  Got IV iron this admission.  Transfused 1 unit 8/13 for Hgb 6.8 g/dL.  Appropriate post-transfusion bump.  - Hold Eliquis - Continue Aranesp   Edema and ascites  Cardiology consulted.  Echo repeated, rules out a significant component of right heart failure/pHTN.   Her swelling is multifactorial from cirrhosis, third spacing/hypoalbuminemia, worsening renal failure, venous insufficiency. Acute congestive heart failure ruled out.  Abdomen more prominent today. - US abdomen limited today to eval for ascites - If significant ascites, will obtain paracentesis tomorrow with albumin on non HD day  Acute hepatic encephalopathy [facility]) Patient with acute encephalopathy 8/11,  confused, somnolent.  Ammonia checked and it was up to 159.  Having 2-3 BMs per day, mentation slightly slow, but better than previous - Continue rifaximin and lactulose   Acute metabolic encephalopathy Due to hepatic encephalopathy  Cirrhosis ([place]) The patient has had radiographic evidence of cirrhosis for a long time, but appeared fairly well compensated.    However, the current illness appears to have decompensated it, and unmasked the extent to which cirrhosis was driving her edema.  This concurrent hepatic and renal failure portends a worse prognosis.  INR 1.6, MELD relatively low, but prognosis poor in setting of concurrent renal failure.   Hyponatremia - Defer HD to Nephrology  Chronic kidney disease (CKD), stage IV (severe) ([place]) - Consult Nephrology  Hypokalemia Resolved   Chronic diastolic CHF (congestive heart failure) ([place]) See above, cardiology feel that acute CHF is ruled out. - Hold metolazone and torsemide - Consult Cardiology  Chronic obstructive pulmonary disease, unspecified COPD type ([place]) No active disease  Paroxysmal atrial fibrillation ([place]) Not on anticoagulation - Continue amiodarone  Coronary artery disease involving native coronary artery of native heart with unstable angina pectoris ([place]) No aspirin due to GI bleed.  No beta-blocker, ARB due to hypotension.  No statin due to cirrhosis.    Morbid obesity (HCC) BMI 36 with history hypertension, atherosclerosis  OSA on CPAP - CPAP at night  Hyperlipidemia Not on statin  Essential hypertension, benign - Hold torsemide          Subjective: Patient with increased swelling of the abdomen, overall feels weak and tired, some malaise, some  nausea, but no headache, chest pain, dyspnea.  Swelling improvement.     Physical Exam: Vitals:   12/11/21 2257 12/12/21 0337 12/12/21 0730 12/12/21 1139  BP: (!) 123/53 (!) 107/50 (!) 115/46 (!) 148/68  Pulse: 78 66 67 79  Resp: '20 18 16 19   '$ Temp: 98 F (36.7 C) 98 F (36.7 C) 98.9 F (37.2 C) 98.2 F (36.8 C)  TempSrc:      SpO2: 97% 98% 97% 98%  Weight:      Height:       Elderly adult female, weak and tired, lying in bed, interactive RRR, no murmurs, marked pitting edema in all 4 extremities Respiratory rate shallow, no rales or wheezes appreciated Abdomen distended feels like ascites, no tenderness palpation, no guarding Attention normal, affect blunted, psychomotor slowing mild, no asterixis, face symmetric, speech slow but fluent, generalized weakness, symmetric strength  Data Reviewed: Discussed with nephrology Hemogram shows hemoglobin up to 7.8, white count slightly elevated, platelets 270 Patient metabolic panel shows creatinine up to 2.9, albumin 2.2, potassium down to 3.2, sodium 132 no change Magnesium normal    Disposition: Status is: Inpatient Patient was admitted for edema which is multifactorial due to liver failure, renal failure, and heart failure.  She was started on diuretics but her renal function worsened and so she was started on dialysis.  The current illness has unmasked her cirrhosis, which appears to be worsening.  Overall her prognosis is poor.          Author: Edwin Dada, MD 12/12/2021 2:20 PM  For on call review [email or web address].

## 2021-12-13 DIAGNOSIS — I2510 Atherosclerotic heart disease of native coronary artery without angina pectoris: Secondary | ICD-10-CM | POA: Diagnosis not present

## 2021-12-13 DIAGNOSIS — N170 Acute kidney failure with tubular necrosis: Secondary | ICD-10-CM | POA: Diagnosis not present

## 2021-12-13 DIAGNOSIS — N171 Acute kidney failure with acute cortical necrosis: Secondary | ICD-10-CM

## 2021-12-13 DIAGNOSIS — N184 Chronic kidney disease, stage 4 (severe): Secondary | ICD-10-CM | POA: Diagnosis not present

## 2021-12-13 DIAGNOSIS — E782 Mixed hyperlipidemia: Secondary | ICD-10-CM

## 2021-12-13 DIAGNOSIS — R600 Localized edema: Secondary | ICD-10-CM

## 2021-12-13 DIAGNOSIS — D696 Thrombocytopenia, unspecified: Secondary | ICD-10-CM

## 2021-12-13 DIAGNOSIS — Z9989 Dependence on other enabling machines and devices: Secondary | ICD-10-CM

## 2021-12-13 DIAGNOSIS — I5032 Chronic diastolic (congestive) heart failure: Secondary | ICD-10-CM | POA: Diagnosis not present

## 2021-12-13 DIAGNOSIS — K7460 Unspecified cirrhosis of liver: Secondary | ICD-10-CM

## 2021-12-13 DIAGNOSIS — G9341 Metabolic encephalopathy: Secondary | ICD-10-CM | POA: Diagnosis not present

## 2021-12-13 DIAGNOSIS — I10 Essential (primary) hypertension: Secondary | ICD-10-CM

## 2021-12-13 DIAGNOSIS — R188 Other ascites: Secondary | ICD-10-CM

## 2021-12-13 DIAGNOSIS — G4733 Obstructive sleep apnea (adult) (pediatric): Secondary | ICD-10-CM

## 2021-12-13 LAB — RENAL FUNCTION PANEL
Albumin: 2.2 g/dL — ABNORMAL LOW (ref 3.5–5.0)
Anion gap: 7 (ref 5–15)
BUN: 28 mg/dL — ABNORMAL HIGH (ref 8–23)
CO2: 26 mmol/L (ref 22–32)
Calcium: 8.2 mg/dL — ABNORMAL LOW (ref 8.9–10.3)
Chloride: 100 mmol/L (ref 98–111)
Creatinine, Ser: 1.94 mg/dL — ABNORMAL HIGH (ref 0.44–1.00)
GFR, Estimated: 25 mL/min — ABNORMAL LOW (ref >60)
Glucose, Bld: 99 mg/dL (ref 70–99)
Phosphorus: 2.8 mg/dL (ref 2.5–4.6)
Potassium: 3.2 mmol/L — ABNORMAL LOW (ref 3.5–5.1)
Sodium: 133 mmol/L — ABNORMAL LOW (ref 135–145)

## 2021-12-13 LAB — MAGNESIUM: Magnesium: 1.7 mg/dL (ref 1.7–2.4)

## 2021-12-13 LAB — CBC
HCT: 25.9 % — ABNORMAL LOW (ref 36.0–46.0)
Hemoglobin: 8.7 g/dL — ABNORMAL LOW (ref 12.0–15.0)
MCH: 30.3 pg (ref 26.0–34.0)
MCHC: 33.6 g/dL (ref 30.0–36.0)
MCV: 90.2 fL (ref 80.0–100.0)
Platelets: 109 10*3/uL — ABNORMAL LOW (ref 150–400)
RBC: 2.87 MIL/uL — ABNORMAL LOW (ref 3.87–5.11)
RDW: 20.1 % — ABNORMAL HIGH (ref 11.5–15.5)
WBC: 11.8 10*3/uL — ABNORMAL HIGH (ref 4.0–10.5)
nRBC: 0 % (ref 0.0–0.2)

## 2021-12-13 NOTE — Progress Notes (Signed)
Progress Note   Patient: April [name] BJS:[id number] DOB: 06/25/1940 DOA: 11/23/2021     19 DOS: the patient was seen and examined on 12/13/2021 at 9:10 AM      Brief hospital course: 81 yo F with dCHF, AF on amiodarone no AC, CKD IV baseline 2-2.4 who presented for abdominal and leg swelling and weight gain.   Patient was initially thought to have CHF or nephrogenic edema, and so Nephrology were consulted and she was given escalating doses of Lasix.  Ultimately she developed hepatic encephalopathy, and it became clear her weight gain was ascites and hepatic swelling was a major contributor.   In the meantime, she failed to respond to diuretics, and her renal function worsened and she was started on dialysis.     Assessment and Plan: * Acute renal failure superimposed on stage 4 chronic kidney disease (HCC) S/p tunneled catheter 8/10  HD #1 8/11 HD #2 8/12 Minimal UOP again 200cc last 24  HD #3 8/14 planned - HD per nephrology   Anemia of chronic kidney failure, stage 4 (severe) (HCC) Patient's Hgb has been trending down steadily over the last few months.  Certainly this is multifactorial with renal failure driving it, and chronic GI blood loss contribtuing.  Iron studies in June showed ferritin 24.  Got IV iron this admission.  Transfused 1 unit 8/13 for Hgb 6.8 g/dL.  Appropriate post-transfusion bump.  - Hold Eliquis - Continue Aranesp   Edema and ascites  Cardiology consulted for ?right heart cath needed.  They repeated echo which ruled out a significant component of right heart failure/pHTN.   Her swelling is multifactorial from cirrhosis, third spacing/hypoalbuminemia, worsening renal failure, venous insufficiency. Acute congestive heart failure ruled out.  US abdomen 8/14 showed mild ascites only, not enough to paracentesis at this point.  - If ascites worsened, would obtain paracentesis with albumin   Acute hepatic encephalopathy [facility]) Patient with acute  encephalopathy 8/11, confused, somnolent.  Ammonia checked and it was up to 159.  Started on lactulose and now having 2-3 BMs per day, mentation improved. - Continue rifaximin and lactulose   Acute metabolic encephalopathy Due to hepatic encephalopathy  Cirrhosis ([place]) The patient has had radiographic evidence of cirrhosis for a long time, but appeared fairly well compensated.    However, the current illness appears to have decompensated it, and unmasked the extent to which cirrhosis was driving her edema.  This concurrent hepatic and renal failure portends a worse prognosis.  INR 1.6, MELD relatively low, but prognosis poor in setting of concurrent renal failure.   Thrombocytopenia ([place]) Platelets have been trending down.  This is due to cirrhosis.  Portends a worse prognosis.  Hyponatremia - Defer HD to Nephrology  Chronic kidney disease (CKD), stage IV (severe) ([place]) - Consult Nephrology  Hypokalemia Resolved   Chronic diastolic CHF (congestive heart failure) ([place]) See above, cardiology feel that acute CHF is ruled out. - Hold metolazone and torsemide - Consult Cardiology  Chronic obstructive pulmonary disease, unspecified COPD type ([place]) No active disease  Paroxysmal atrial fibrillation ([place]) Not on anticoagulation - Continue amiodarone  Coronary artery disease involving native coronary artery of native heart with unstable angina pectoris ([place]) No aspirin due to GI bleed.  No beta-blocker, ARB due to hypotension.  No statin due to cirrhosis.    Morbid obesity (HCC) BMI 36 with history hypertension, atherosclerosis  OSA on CPAP - CPAP at night  Hyperlipidemia Not on statin  Essential hypertension, benign - Hold torsemide  Subjective: Patient feels generally tired, she had no vomiting, no confusion, no seizures.  No orthopnea.  Still very swollen.  No fever.  No change in the swelling in her arms.   Objective: Elderly adult female, lying in  bed, no acute distress, appears very tired RRR, soft systolic murmur, diffuse pitting edema Respiratory rate shallow, no rales or wheezes appreciated Abdomen soft, some ascites, not tense, not distended, no tenderness palpation in all quadrants Attention normal, affect blunted, mild psychomotor slowing, oriented to person, place, time, diffuse generalized weakness       Physical Exam: Vitals:   12/13/21 0451 12/13/21 0749 12/13/21 1133 12/13/21 1528  BP:  (!) 107/52 (!) 120/51 (!) 133/54  Pulse:  64 70 73  Resp:  '14 16 19  '$ Temp:  98.6 F (37 C) 98.1 F (36.7 C) 98.1 F (36.7 C)  TempSrc:  Oral Oral   SpO2:  100% 96% 99%  Weight: 86.8 kg     Height:         Data Reviewed: Magnesium normal Creatinine down to 1.9 after dialysis yesterday, potassium still low, sodium still low Hemoglobin up to 8.7, platelets down to 109 Ultrasound shows small volume ascites    Disposition: Status is: Inpatient The patient was admitted with edema.  This was initially treated for CHF, although now it appears that he is simply combination of renal failure and cirrhosis.  As a result of diuretics, her renal function worsened and she is now on dialysis.  Nephrology believes that she may need long-term dialysis.  We will consult palliative care to discuss goals of care, given her age and poor candidacy for long term HD, while nephrology arrange for tunneled catheter placement and possible outpatient dialysis, which I suspect she will hope for given her previous good level of function.           Author: Edwin Dada, MD 12/13/2021 4:24 PM  For on call review [email or web address].

## 2021-12-13 NOTE — Assessment & Plan Note (Signed)
Platelets have been trending down.  This is due to cirrhosis.  Portends a worse prognosis.

## 2021-12-13 NOTE — Progress Notes (Signed)
Physical Therapy Treatment Patient Details Name: MONNIE GUDGEL MRN: [id number] DOB: 03-Feb-1941 Today's Date: 12/13/2021   History of Present Illness 81 y.o. female presented to hospital 7/26 with peripheral edema.  Pt admitted with acute on chronic diastolic CHF.  PMH CHF, anemia, sleep apnea, CAD s/p tenting, h/o paroxysmal a-fib (not on anticoagulation secondary to recurrent GI bleed), CKD, spasmodic dysphonia, STEMI, urine incontinence, B breast surgery. Patient was started on dialysis secondary to fluid overload and mental status changes.    PT Comments    Pt received in Semi-Fowler's position and agreeable to therapy.  Pt in much better mentation today than in previous session and is willing to participate in mobility training.  Pt moving much better today and was able to ambulate with the use of the RW around the nursing station before returning to the recliner.  Pt requested the RW because she feels much more secure and is more stable.  Pt also advised to call when needing to get up to use the restroom.  Pt left upright in recliner with all needs met.  Current discharge plans to home with HHPT remain appropriate at this time.  Pt will continue to benefit from skilled therapy in order to address deficits listed below.    Recommendations for follow up therapy are one component of a multi-disciplinary discharge planning process, led by the attending physician.  Recommendations may be updated based on patient status, additional functional criteria and insurance authorization.  Follow Up Recommendations  Home health PT     Assistance Recommended at Discharge Set up Supervision/Assistance  Patient can return home with the following A little help with bathing/dressing/bathroom;Assistance with cooking/housework;Assist for transportation;Help with stairs or ramp for entrance   Equipment Recommendations  Rolling walker (2 wheels);BSC/3in1    Recommendations for Other Services        Precautions / Restrictions Precautions Precautions: Fall Precaution Comments: temp R femoral HD cath Restrictions Weight Bearing Restrictions: No     Mobility  Bed Mobility Overal bed mobility: Needs Assistance Bed Mobility: Supine to Sit     Supine to sit: HOB elevated, Min guard     General bed mobility comments: Pt left in recliner upon leaving.    Transfers Overall transfer level: Needs assistance Equipment used: Rolling walker (2 wheels) Transfers: Sit to/from Stand Sit to Stand: Min guard           General transfer comment: Pt requested the use of the RW for increased ambulation.    Ambulation/Gait Ambulation/Gait assistance: Min guard Gait Distance (Feet): 160 Feet Assistive device: Rolling walker (2 wheels) Gait Pattern/deviations: Step-through pattern Gait velocity: decreased     General Gait Details: Pt moving much better than last session and has greater mental stability than in past session.   Stairs             Wheelchair Mobility    Modified Rankin (Stroke Patients Only)       Balance Overall balance assessment: Needs assistance Sitting-balance support: No upper extremity supported, Single extremity supported, Feet supported Sitting balance-Leahy Scale: Fair     Standing balance support: Bilateral upper extremity supported, During functional activity, No upper extremity supported, Single extremity supported Standing balance-Leahy Scale: Fair                              Cognition Arousal/Alertness: Awake/alert Behavior During Therapy: WFL for tasks assessed/performed Overall Cognitive Status: Within Functional Limits for tasks assessed  Exercises      General Comments        Pertinent Vitals/Pain Pain Assessment Pain Assessment: No/denies pain Pain Location: buttocks Pain Descriptors / Indicators: Aching Pain Intervention(s): Limited activity  within patient's tolerance, Monitored during session, Premedicated before session, Repositioned    Home Living                          Prior Function            PT Goals (current goals can now be found in the care plan section) Acute Rehab PT Goals Patient Stated Goal: to go home PT Goal Formulation: With patient Time For Goal Achievement: 12/14/21 Potential to Achieve Goals: Good Progress towards PT goals: Progressing toward goals    Frequency    Min 2X/week      PT Plan Current plan remains appropriate    Co-evaluation              AM-PAC PT "6 Clicks" Mobility   Outcome Measure  Help needed turning from your back to your side while in a flat bed without using bedrails?: None Help needed moving from lying on your back to sitting on the side of a flat bed without using bedrails?: A Little Help needed moving to and from a bed to a chair (including a wheelchair)?: A Little Help needed standing up from a chair using your arms (e.g., wheelchair or bedside chair)?: A Little Help needed to walk in hospital room?: A Little Help needed climbing 3-5 steps with a railing? : A Little 6 Click Score: 19    End of Session Equipment Utilized During Treatment: Gait belt Activity Tolerance: Patient tolerated treatment well Patient left: with call bell/phone within reach;in chair;with chair alarm set Nurse Communication: Mobility status PT Visit Diagnosis: Unsteadiness on feet (R26.81);Muscle weakness (generalized) (M62.81);Other abnormalities of gait and mobility (R26.89)     Time: 1859-0931 PT Time Calculation (min) (ACUTE ONLY): 19 min  Charges:  $Gait Training: 8-22 mins                     Gwenlyn Saran, PT, DPT 12/13/21, 12:43 PM

## 2021-12-13 NOTE — Progress Notes (Signed)
Progress Note  Patient Name: April [name] Date of Encounter: 12/13/2021  [facility] Cardiologist: Ida Rogue, MD   Subjective   Patient seen on AM rounds. Had dialysis yesterday with -1L . Denies any chest pain or worsening shortness of breath.  Inpatient Medications    Scheduled Meds:  amiodarone  200 mg Oral Daily   Chlorhexidine Gluconate Cloth  6 each Topical Q0600   darbepoetin (ARANESP) injection - NON-DIALYSIS  100 mcg Subcutaneous Q Fri-1800   docusate sodium  100 mg Oral BID   heparin injection (subcutaneous)  5,000 Units Subcutaneous Q8H   lactulose  20 g Oral BID   lidocaine  2 patch Transdermal Q24H   pantoprazole  40 mg Oral BID   rifaximin  200 mg Oral BID   sodium chloride flush  10-40 mL Intracatheter Q12H   Continuous Infusions:  sodium chloride Stopped (12/03/21 1136)   promethazine (PHENERGAN) injection (IM or IVPB) 12.5 mg (12/05/21 1301)   PRN Meds: sodium chloride, acetaminophen, diclofenac Sodium, nitroGLYCERIN, ondansetron (ZOFRAN) IV, promethazine (PHENERGAN) injection (IM or IVPB), sodium chloride flush   Vital Signs    Vitals:   12/12/21 2337 12/13/21 0407 12/13/21 0451 12/13/21 0749  BP: (!) 126/54 (!) 119/59  (!) 107/52  Pulse: 64 66  64  Resp: '18 18  14  '$ Temp: 97.6 F (36.4 C) 97.8 F (36.6 C)  98.6 F (37 C)  TempSrc: Oral   Oral  SpO2: 100% 97%  100%  Weight:   86.8 kg   Height:        Intake/Output Summary (Last 24 hours) at 12/13/2021 0840 Last data filed at 12/12/2021 1930 Gross per 24 hour  Intake --  Output 1000 ml  Net -1000 ml      12/13/2021    4:51 AM 12/12/2021    8:09 PM 12/12/2021    4:11 PM  Last 3 Weights  Weight (lbs) 191 lb 5.8 oz 183 lb 6.8 oz 190 lb 14.7 oz  Weight (kg) 86.8 kg 83.2 kg 86.6 kg      Telemetry    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 Resting in bed with  eyes closed, removed cpap for exam.  Neck: No JVD Cardiac: RRR, no murmurs, rubs, or gallops.  Respiratory: Clear with forced expiratory wheezing to auscultation bilaterally. Respirations are unlabored on room air. GI: Soft, nontender, non-distended  MS: 2+ edema to BLE; No deformity. Neuro:  Nonfocal  Psych: Normal affect   Labs    High Sensitivity Troponin:   Recent Labs  Lab 11/23/21 1514 11/23/21 1714  TROPONINIHS 18* 21*     Chemistry Recent Labs  Lab 12/11/21 0434 12/12/21 0540 12/13/21 0440  NA 134* 132* 133*  K 3.4* 3.2* 3.2*  CL 100 99 100  CO2 '28 25 26  '$ GLUCOSE 109* 100* 99  BUN 32* 43* 28*  CREATININE 2.57* 2.92* 1.94*  CALCIUM 8.0* 8.3* 8.2*  MG 1.9 2.0 1.7  PROT 5.4*  --   --   ALBUMIN 2.3* 2.2* 2.2*  AST 93*  --   --   ALT 36  --   --   ALKPHOS 80  --   --   BILITOT 1.4*  --   --   GFRNONAA 18* 15* 25*  ANIONGAP '6 8 7    '$ Lipids No results for input(s): "CHOL", "TRIG", "  HDL", "LABVLDL", "[place]", "CHOLHDL" in the last 168 hours.  Hematology Recent Labs  Lab 12/11/21 0434 12/12/21 0540 12/13/21 0440  WBC 12.7* 10.6* 11.8*  RBC 2.22* 2.66* 2.87*  HGB 6.8* 7.8* 8.7*  HCT 20.0* 23.9* 25.9*  MCV 90.1 89.8 90.2  MCH 30.6 29.3 30.3  MCHC 34.0 32.6 33.6  RDW 20.7* 19.1* 20.1*  PLT 144* 133* 109*   Thyroid No results for input(s): "TSH", "FREET4" in the last 168 hours.  BNPNo results for input(s): "BNP", "PROBNP" in the last 168 hours.  DDimer No results for input(s): "DDIMER" in the last 168 hours.   Radiology    US Abdomen Limited  Result Date: 12/12/2021 CLINICAL DATA:  Abdominal distension and cirrhosis. EXAM: LIMITED ABDOMEN ULTRASOUND FOR ASCITES TECHNIQUE: Limited ultrasound survey for ascites was performed in all four abdominal quadrants. COMPARISON:  11/08/2021.  CT 11/29/2020 FINDINGS: Small volume ascites identified within all 4 quadrants. IMPRESSION: Small volume abdominal ascites. Electronically Signed   By: Abigail Miyamoto M.D.   On:  12/12/2021 13:41    Cardiac Studies   Echocardiogram completed 08/10/2021 1. Left ventricular ejection fraction, by estimation, is 60 to 65%. The  left ventricle has normal function. The left ventricle has no regional  wall motion abnormalities. Left ventricular diastolic parameters are  consistent with Grade II diastolic  dysfunction (pseudonormalization).   2. Right ventricular systolic function is normal. The right ventricular  size is mildly enlarged. There is mildly elevated pulmonary artery  systolic pressure. The estimated right ventricular systolic pressure is  16.1 mmHg.   3. Left atrial size was mildly dilated.   4. Right atrial size was mildly dilated.   5. The mitral valve is normal in structure. Mild mitral valve  regurgitation.   6. Tricuspid valve regurgitation is moderate.   7. The aortic valve is grossly normal. Aortic valve regurgitation is not  visualized.   8. The inferior vena cava is normal in size with greater than 50%  respiratory variability, suggesting right atrial pressure of 3 mmHg.    LHC 03/2016 Prox RCA lesion, 80 %stenosed. A STENT PROMUS PREM MR 3.5X16 (3.8-4.1 mm) drug eluting stent was successfully placed. Post intervention, there is a 0% residual stenosis. Ost RPDA lesion, 90 %stenosed. A STENT PROMUS PREM MR 2.5X12 (2.7 mm) drug eluting stent was successfully placed. Post intervention, there is a 0% residual stenosis. __________________________________ Prox RCA to Mid RCA lesion, 50 %stenosed. Dist RCA lesion, 30 %stenosed.- Appears smooth and chronic Dist LAD lesion, 25 %stenosed - diffuse disease in the distal LAD. Nonsignificant The left ventricular systolic function is normal. The left ventricular ejection fraction is 55-65% by visual estimate. There is no mitral valve regurgitation. There is no aortic valve stenosis.   Thankfully, the patient presented chest pain-free indicating likely resolution of 100% occluded vessel. This was found to  be the case with a thrombotic 80% proximal RCA as well as a very focal 90% ostial PDA lesion. Both these were treated with DES stents as non-Primary PCI. The digital mid RCA 50% stenosis was relatively smooth and appears to be chronic. This will be treated medically.   Preserved EF with mildly elevated LVEDP. Severe systemic hypertension noted.   Plan: Admit to TCU, step down cardiac unit for post MI/PCI care. TR band removal per protocol Continue Aggrastat infusion until current bag complete Aspirin plus Brilinta for minimum 3 months. At which time could consider stopping aspirin. Restart home dose of carvedilol and losartan.  As she is on furosemide,  would hold HCTZ. Increase home Crestor dose to 40 mg.   Expect Fast-Track Discharge    Coronary Diagrams   Diagnostic Dominance: Right Intervention      Patient Profile     81 y.o. female with CAD s/p PCI, PAF not an anticoagulant due to h/o GI bleed, HTN, HLD, DM, CKD, who is being seen and evaluated for chronic diastolic heart failure.  Assessment & Plan    HFpEF - -1L output from HD yesterday afternoon -fluid removal will continue to be managed by dialysis - she had noted worsening renal function with diuresis - shortness of breath and edema continue to improve  - daily weight, I&O, low sodium diet - daily bmp  CAD - remains chest pain free - s/p RCA PCI - unable to tolerate statin therapy 2/2 cirrhosis - unable to tolerate asa due to h/o gi bleed - currently not on beta blocker due to recent hypotension  PAF - currently in sinus  - continue amiodarone - not on anticoagulation 2/2 h/o gi bleed  HTN - blood pressure 107/52 - continues to remain off of antihypertensives - vital signs per unit protocol  OSA - continue with CPAP nightly   Cirrhosis - ammonia was 159 - continue lactulose - management per IM  Anemia - hgb 8.7 - received 1 unit of blood on 12/11/2021 - recommend keeping hgb 8 or greater -  daily cbc      For questions or updates, please contact [facility] Please consult [email or web address] for contact info under        Signed, Zachari Alberta, NP  12/13/2021, 8:40 AM

## 2021-12-13 NOTE — Progress Notes (Signed)
[facility]  PROGRESS NOTE   Subjective:   Patient seen resting in bed, no family at bedside Alert and oriented Tolerating meals without nausea and vomiting, reports improved appetite Lower extremity edema remains, but improved  Received dialysis yesterday, tolerated well  Objective:  Vital signs: Blood pressure (!) 120/51, pulse 70, temperature 98.1 F (36.7 C), temperature source Oral, resp. rate 16, height 5' (1.524 m), weight 86.8 kg, last menstrual period 05/01/1972, SpO2 96 %.  Intake/Output Summary (Last 24 hours) at 12/13/2021 1407 Last data filed at 12/13/2021 1325 Gross per 24 hour  Intake 360 ml  Output 1200 ml  Net -840 ml    Filed Weights   12/12/21 1611 12/12/21 2009 12/13/21 0451  Weight: 86.6 kg 83.2 kg 86.8 kg     Physical Exam: General:  No acute distress  Head:  Normocephalic, atraumatic. Moist oral mucosal membranes  Eyes:  Anicteric  Lungs:   Clear to auscultation, normal effort  Heart:  S1S2 no rubs  Abdomen:   Soft, nontender, bowel sounds present  Extremities: 1+ peripheral edema.  Neurologic:  Awake, alert, following commands  Skin:  No lesions  Access: Right femoral temp cath    Basic Metabolic Panel: Recent Labs  Lab 12/09/21 0638 12/10/21 0555 12/11/21 0434 12/12/21 0540 12/13/21 0440  NA 129* 133* 134* 132* 133*  K 4.7 4.1 3.4* 3.2* 3.2*  CL 98 100 100 99 100  CO2 '23 24 28 25 26  '$ GLUCOSE 110* 100* 109* 100* 99  BUN 54* 44* 32* 43* 28*  CREATININE 3.16* 2.75* 2.57* 2.92* 1.94*  CALCIUM 8.7* 8.3* 8.0* 8.3* 8.2*  MG 2.4 2.2 1.9 2.0 1.7  PHOS  --   --   --  3.6 2.8     CBC: Recent Labs  Lab 12/09/21 0638 12/10/21 0555 12/11/21 0434 12/12/21 0540 12/13/21 0440  WBC 12.4* 16.1* 12.7* 10.6* 11.8*  HGB 8.6* 7.2* 6.8* 7.8* 8.7*  HCT 24.9* 20.7* 20.0* 23.9* 25.9*  MCV 86.5 88.5 90.1 89.8 90.2  PLT 213 159 144* 133* 109*      Urinalysis: No results for input(s): "COLORURINE", "LABSPEC", "PHURINE",  "GLUCOSEU", "HGBUR", "BILIRUBINUR", "KETONESUR", "PROTEINUR", "UROBILINOGEN", "NITRITE", "LEUKOCYTESUR" in the last 72 hours.  Invalid input(s): "APPERANCEUR"    Imaging: US Abdomen Limited  Result Date: 12/12/2021 CLINICAL DATA:  Abdominal distension and cirrhosis. EXAM: LIMITED ABDOMEN ULTRASOUND FOR ASCITES TECHNIQUE: Limited ultrasound survey for ascites was performed in all four abdominal quadrants. COMPARISON:  11/08/2021.  CT 11/29/2020 FINDINGS: Small volume ascites identified within all 4 quadrants. IMPRESSION: Small volume abdominal ascites. Electronically Signed   By: Abigail Miyamoto M.D.   On: 12/12/2021 13:41     Medications:    sodium chloride Stopped (12/03/21 1136)   promethazine (PHENERGAN) injection (IM or IVPB) 12.5 mg (12/05/21 1301)    amiodarone  200 mg Oral Daily   Chlorhexidine Gluconate Cloth  6 each Topical Q0600   darbepoetin (ARANESP) injection - NON-DIALYSIS  100 mcg Subcutaneous Q Fri-1800   docusate sodium  100 mg Oral BID   heparin injection (subcutaneous)  5,000 Units Subcutaneous Q8H   lactulose  20 g Oral BID   lidocaine  2 patch Transdermal Q24H   pantoprazole  40 mg Oral BID   rifaximin  200 mg Oral BID   sodium chloride flush  10-40 mL Intracatheter Q12H    Assessment/ Plan:     Principal Problem:   Acute renal failure superimposed on stage 4 chronic kidney disease (HCC) Active Problems:  Essential hypertension, benign   Hyperlipidemia   OSA on CPAP   Morbid obesity (HCC)   Coronary artery disease involving native coronary artery of native heart with unstable angina pectoris (HCC)   Paroxysmal atrial fibrillation (HCC)   Chronic heart failure with preserved ejection fraction (HFpEF) (HCC)   Chronic obstructive pulmonary disease, unspecified COPD type (HCC)   Chronic diastolic CHF (congestive heart failure) (HCC)   Hypokalemia   Chronic kidney disease (CKD), stage IV (severe) (HCC)   Hyponatremia   Anemia of chronic kidney failure, stage  4 (severe) (HCC)   Edema and ascites    Acute metabolic encephalopathy   Acute hepatic encephalopathy (HCC)   Cirrhosis (HCC)   Thrombocytopenia ([place])  Ms. SHANIK BROOKSHIRE is a 81 y.o.  female with past medical history including diastolic CHF, anemia, CAD, paroxysmal atrial fibrillation without anticoagulation.  GI bleed.  Chronic kidney disease stage IV.  Patient reports to ED with complaints of lower extremity edema.  Patient has been admitted for Acute CHF (congestive heart failure) ([place]) [I50.9] Acute decompensated heart failure ([place]) [I50.9]   #1: Acute kidney injury on CKD: Patient was started on dialysis secondary to fluid overload and mental status changes.    Patient has returned to baseline mentation.  No urine output recorded yesterday.  We will monitor urine output and labs to determine further dialysis treatments.  We will plan for dialysis tomorrow.  Due to multiple failed outpatient diuretics attempts, we feel patient may require dialysis at discharge to manage fluid status.  We will continue to monitor closely, and if outpatient dialysis is required.  We will consult vascular for placement of PermCath and seek outpatient clinic placement.   #2: Anemia with chronic kidney disease: Hemoglobin remains below desired target however improving.  Continue weekly Aranesp.   #3: Congestive heart failure: Achieving adequate fluid removal with dialysis.   LOS: [facility] 8/15/20232:07 PM

## 2021-12-14 DIAGNOSIS — R600 Localized edema: Secondary | ICD-10-CM | POA: Diagnosis not present

## 2021-12-14 DIAGNOSIS — K7682 Hepatic encephalopathy: Secondary | ICD-10-CM | POA: Diagnosis not present

## 2021-12-14 DIAGNOSIS — G9341 Metabolic encephalopathy: Secondary | ICD-10-CM | POA: Diagnosis not present

## 2021-12-14 DIAGNOSIS — E876 Hypokalemia: Secondary | ICD-10-CM

## 2021-12-14 DIAGNOSIS — J449 Chronic obstructive pulmonary disease, unspecified: Secondary | ICD-10-CM

## 2021-12-14 DIAGNOSIS — I5032 Chronic diastolic (congestive) heart failure: Secondary | ICD-10-CM | POA: Diagnosis not present

## 2021-12-14 DIAGNOSIS — N171 Acute kidney failure with acute cortical necrosis: Secondary | ICD-10-CM | POA: Diagnosis not present

## 2021-12-14 DIAGNOSIS — I480 Paroxysmal atrial fibrillation: Secondary | ICD-10-CM | POA: Diagnosis not present

## 2021-12-14 DIAGNOSIS — N184 Chronic kidney disease, stage 4 (severe): Secondary | ICD-10-CM | POA: Diagnosis not present

## 2021-12-14 DIAGNOSIS — Z515 Encounter for palliative care: Secondary | ICD-10-CM

## 2021-12-14 LAB — CBC
HCT: 24 % — ABNORMAL LOW (ref 36.0–46.0)
Hemoglobin: 8.1 g/dL — ABNORMAL LOW (ref 12.0–15.0)
MCH: 30.9 pg (ref 26.0–34.0)
MCHC: 33.8 g/dL (ref 30.0–36.0)
MCV: 91.6 fL (ref 80.0–100.0)
Platelets: 105 10*3/uL — ABNORMAL LOW (ref 150–400)
RBC: 2.62 MIL/uL — ABNORMAL LOW (ref 3.87–5.11)
RDW: 20.6 % — ABNORMAL HIGH (ref 11.5–15.5)
WBC: 11.7 10*3/uL — ABNORMAL HIGH (ref 4.0–10.5)
nRBC: 0 % (ref 0.0–0.2)

## 2021-12-14 LAB — RENAL FUNCTION PANEL
Albumin: 2.1 g/dL — ABNORMAL LOW (ref 3.5–5.0)
Anion gap: 7 (ref 5–15)
BUN: 42 mg/dL — ABNORMAL HIGH (ref 8–23)
CO2: 25 mmol/L (ref 22–32)
Calcium: 8.2 mg/dL — ABNORMAL LOW (ref 8.9–10.3)
Chloride: 101 mmol/L (ref 98–111)
Creatinine, Ser: 2.37 mg/dL — ABNORMAL HIGH (ref 0.44–1.00)
GFR, Estimated: 20 mL/min — ABNORMAL LOW (ref >60)
Glucose, Bld: 100 mg/dL — ABNORMAL HIGH (ref 70–99)
Phosphorus: 3.1 mg/dL (ref 2.5–4.6)
Potassium: 3.2 mmol/L — ABNORMAL LOW (ref 3.5–5.1)
Sodium: 133 mmol/L — ABNORMAL LOW (ref 135–145)

## 2021-12-14 LAB — MAGNESIUM: Magnesium: 2 mg/dL (ref 1.7–2.4)

## 2021-12-14 LAB — CULTURE, BLOOD (ROUTINE X 2)
Culture: NO GROWTH
Culture: NO GROWTH
Special Requests: ADEQUATE
Special Requests: ADEQUATE

## 2021-12-14 LAB — AMMONIA: Ammonia: 23 umol/L (ref 9–35)

## 2021-12-14 MED ORDER — FAMOTIDINE 20 MG PO TABS: 40.0000 mg | ORAL_TABLET | Freq: Once | ORAL | Status: DC | PRN

## 2021-12-14 MED ORDER — HEPARIN SODIUM (PORCINE) 1000 UNIT/ML IJ SOLN
INTRAMUSCULAR | Status: AC
  Filled 2021-12-14: qty 10

## 2021-12-14 MED ORDER — EPOETIN ALFA 10000 UNIT/ML IJ SOLN
INTRAMUSCULAR | Status: DC
Start: 2021-12-14 — End: 2021-12-14
  Filled 2021-12-14: qty 1

## 2021-12-14 MED ORDER — CEFAZOLIN SODIUM-DEXTROSE 1-4 GM/50ML-% IV SOLN
1.0000 g | INTRAVENOUS | Status: DC
  Filled 2021-12-14: qty 50

## 2021-12-14 MED ORDER — MIDAZOLAM HCL 2 MG/ML PO SYRP
8.0000 mg | ORAL_SOLUTION | Freq: Once | ORAL | Status: DC | PRN
Start: 2021-12-14 — End: 2021-12-15

## 2021-12-14 MED ORDER — PROSOURCE PLUS PO LIQD
30.0000 mL | Freq: Two times a day (BID) | ORAL | Status: DC
  Administered 2021-12-16 – 2021-12-18 (×4): 30 mL via ORAL
  Filled 2021-12-14 (×10): qty 30

## 2021-12-14 MED ORDER — DIPHENHYDRAMINE HCL 50 MG/ML IJ SOLN
50.0000 mg | Freq: Once | INTRAMUSCULAR | Status: DC | PRN
Start: 2021-12-14 — End: 2021-12-15

## 2021-12-14 MED ORDER — SODIUM CHLORIDE 0.9 % IV SOLN: INTRAVENOUS | Status: DC

## 2021-12-14 MED ORDER — RENA-VITE PO TABS
1.0000 | ORAL_TABLET | Freq: Every day | ORAL | Status: DC
  Administered 2021-12-14 – 2021-12-18 (×5): 1 via ORAL
  Filled 2021-12-14 (×6): qty 1

## 2021-12-14 MED ORDER — METHYLPREDNISOLONE SODIUM SUCC 125 MG IJ SOLR
125.0000 mg | Freq: Once | INTRAMUSCULAR | Status: DC | PRN
Start: 2021-12-14 — End: 2021-12-15

## 2021-12-14 MED ORDER — NEPRO/CARBSTEADY PO LIQD
237.0000 mL | Freq: Two times a day (BID) | ORAL | Status: DC
  Administered 2021-12-15 – 2021-12-18 (×4): 237 mL via ORAL

## 2021-12-14 NOTE — Progress Notes (Signed)
PT Cancellation Note  Patient Details Name: April [name] MRN: [id number] DOB: 1940-09-17   Cancelled Treatment:    Reason Eval/Treat Not Completed: Patient at procedure or test/unavailable. Patient in HD currently. Will re-attempt PT later as time allows.     Maiya Kates 12/14/2021, 11:14 AM

## 2021-12-14 NOTE — Progress Notes (Signed)

## 2021-12-14 NOTE — Progress Notes (Signed)
[facility]  PROGRESS NOTE   Subjective:   Patient seen and evaluated during dialysis   HEMODIALYSIS FLOWSHEET:  Blood Flow Rate (mL/min): 400 mL/min Arterial Pressure (mmHg): -160 mmHg Venous Pressure (mmHg): 180 mmHg TMP (mmHg): 2 mmHg Ultrafiltration Rate (mL/min): 593 mL/min Dialysate Flow Rate (mL/min): 300 ml/min Dialysis Fluid Bolus: Normal Saline Bolus Amount (mL): 300 mL  No complaints at this time  Objective:  Vital signs: Blood pressure (!) 127/50, pulse 77, temperature 98.4 F (36.9 C), resp. rate 20, height 5' (1.524 m), weight 85.9 kg, last menstrual period 05/01/1972, SpO2 97 %.  Intake/Output Summary (Last 24 hours) at 12/14/2021 1240 Last data filed at 12/14/2021 1126 Gross per 24 hour  Intake 360 ml  Output 2100 ml  Net -1740 ml    Filed Weights   12/13/21 0451 12/14/21 0600 12/14/21 1152  Weight: 86.8 kg 87.2 kg 85.9 kg     Physical Exam: General:  No acute distress  Head:  Normocephalic, atraumatic. Moist oral mucosal membranes  Eyes:  Anicteric  Lungs:   Clear to auscultation, normal effort  Heart:  S1S2 no rubs  Abdomen:   Soft, nontender, bowel sounds present  Extremities: 1+ peripheral edema.  Neurologic:  Awake, alert, following commands  Skin:  No lesions  Access: Right femoral temp cath    Basic Metabolic Panel: Recent Labs  Lab 12/10/21 0555 12/11/21 0434 12/12/21 0540 12/13/21 0440 12/14/21 0517  NA 133* 134* 132* 133* 133*  K 4.1 3.4* 3.2* 3.2* 3.2*  CL 100 100 99 100 101  CO2 '24 28 25 26 25  '$ GLUCOSE 100* 109* 100* 99 100*  BUN 44* 32* 43* 28* 42*  CREATININE 2.75* 2.57* 2.92* 1.94* 2.37*  CALCIUM 8.3* 8.0* 8.3* 8.2* 8.2*  MG 2.2 1.9 2.0 1.7 2.0  PHOS  --   --  3.6 2.8 3.1     CBC: Recent Labs  Lab 12/10/21 0555 12/11/21 0434 12/12/21 0540 12/13/21 0440 12/14/21 0517  WBC 16.1* 12.7* 10.6* 11.8* 11.7*  HGB 7.2* 6.8* 7.8* 8.7* 8.1*  HCT 20.7* 20.0* 23.9* 25.9* 24.0*  MCV 88.5 90.1 89.8 90.2  91.6  PLT 159 144* 133* 109* 105*      Urinalysis: No results for input(s): "COLORURINE", "LABSPEC", "PHURINE", "GLUCOSEU", "HGBUR", "BILIRUBINUR", "KETONESUR", "PROTEINUR", "UROBILINOGEN", "NITRITE", "LEUKOCYTESUR" in the last 72 hours.  Invalid input(s): "APPERANCEUR"    Imaging: US Abdomen Limited  Result Date: 12/12/2021 CLINICAL DATA:  Abdominal distension and cirrhosis. EXAM: LIMITED ABDOMEN ULTRASOUND FOR ASCITES TECHNIQUE: Limited ultrasound survey for ascites was performed in all four abdominal quadrants. COMPARISON:  11/08/2021.  CT 11/29/2020 FINDINGS: Small volume ascites identified within all 4 quadrants. IMPRESSION: Small volume abdominal ascites. Electronically Signed   By: Abigail Miyamoto M.D.   On: 12/12/2021 13:41     Medications:    sodium chloride Stopped (12/03/21 1136)   promethazine (PHENERGAN) injection (IM or IVPB) 12.5 mg (12/05/21 1301)    amiodarone  200 mg Oral Daily   Chlorhexidine Gluconate Cloth  6 each Topical Q0600   darbepoetin (ARANESP) injection - NON-DIALYSIS  100 mcg Subcutaneous Q Fri-1800   docusate sodium  100 mg Oral BID   heparin injection (subcutaneous)  5,000 Units Subcutaneous Q8H   heparin sodium (porcine)       lactulose  20 g Oral BID   lidocaine  2 patch Transdermal Q24H   pantoprazole  40 mg Oral BID   rifaximin  200 mg Oral BID   sodium chloride flush  10-40 mL  Intracatheter Q12H    Assessment/ Plan:     Principal Problem:   Acute renal failure superimposed on stage 4 chronic kidney disease (HCC) Active Problems:   Essential hypertension, benign   Hyperlipidemia   OSA on CPAP   Morbid obesity (HCC)   Coronary artery disease involving native coronary artery of native heart with unstable angina pectoris (HCC)   Paroxysmal atrial fibrillation (HCC)   Chronic obstructive pulmonary disease, unspecified COPD type (HCC)   Chronic diastolic CHF (congestive heart failure) (HCC)   Hypokalemia   Chronic kidney disease (CKD),  stage IV (severe) (HCC)   Hyponatremia   Anemia of chronic kidney failure, stage 4 (severe) (HCC)   Edema and ascites    Acute metabolic encephalopathy   Acute hepatic encephalopathy (HCC)   Cirrhosis (HCC)   Thrombocytopenia ([place])  April [name] is a 81 y.o.  female with past medical history including diastolic CHF, anemia, CAD, paroxysmal atrial fibrillation without anticoagulation.  GI bleed.  Chronic kidney disease stage IV.  Patient reports to ED with complaints of lower extremity edema.  Patient has been admitted for Acute CHF (congestive heart failure) ([place]) [I50.9] Acute decompensated heart failure ([place]) [I50.9]   #1: Acute kidney injury on CKD stage IV: Patient was started on dialysis secondary to fluid overload and mental status changes.    Mentation has returned to baseline.  Urine output recorded at 600 mL.  Patient receiving dialysis today, UF goal 1.5 L as tolerated.  It appears patient will need continued dialysis at discharge.  We will consult vascular for PermCath placement tomorrow.  Next treatment scheduled for Friday.   #2: Anemia with chronic kidney disease: Hemoglobin improving slowly continue weekly Aranesp.   #3: Congestive heart failure: Fluid management with dialysis.   LOS: [facility] 8/16/202312:40 PM

## 2021-12-14 NOTE — Plan of Care (Signed)
No complaints of pain.

## 2021-12-14 NOTE — Progress Notes (Signed)
Occupational Therapy Treatment Patient Details Name: April [name] MRN: [id number] DOB: March 13, 1941 Today's Date: 12/14/2021   History of present illness 81 y.o. female presented to hospital 7/26 with peripheral edema.  Pt admitted with acute on chronic diastolic CHF.  PMH CHF, anemia, sleep apnea, CAD s/p tenting, h/o paroxysmal a-fib (not on anticoagulation secondary to recurrent GI bleed), CKD, spasmodic dysphonia, STEMI, urine incontinence, B breast surgery. Patient was started on dialysis secondary to fluid overload and mental status changes.   OT comments  Chart reviewed, pt greeted in room agreeable to OT tx session. Pt is alert and oriented x4. Pt reports she just amb to bathroom for toileting with RW. Tx session targeted improving activity tolerance, strength, endurance for improved independence during ADL tasks. STS completed 10 attempts with CGA, static standing, 1 minute 2 attempts to facilitate improved ease of toileting. Education provided re: improving activity tolerance, progressing mobility. Pt is left as received, NAD, all needs met. OT will follow acutely.    Recommendations for follow up therapy are one component of a multi-disciplinary discharge planning process, led by the attending physician.  Recommendations may be updated based on patient status, additional functional criteria and insurance authorization.    Follow Up Recommendations  Skilled nursing-short term rehab (<3 hours/day)    Assistance Recommended at Discharge Frequent or constant Supervision/Assistance  Patient can return home with the following  Assistance with cooking/housework;A little help with bathing/dressing/bathroom;A little help with walking and/or transfers;Assist for transportation   Equipment Recommendations  Other (comment) (per next venue of care)    Recommendations for Other Services      Precautions / Restrictions Precautions Precautions: Fall Precaution Comments: temp R femoral HD  cath Restrictions Weight Bearing Restrictions: No       Mobility Bed Mobility Overal bed mobility: Needs Assistance             General bed mobility comments: NT pt in recliner pre/post session    Transfers Overall transfer level: Needs assistance Equipment used: Rolling walker (2 wheels) Transfers: Sit to/from Stand Sit to Stand: Min guard (10 attempts)                 Balance Overall balance assessment: Needs assistance Sitting-balance support: No upper extremity supported, Single extremity supported, Feet supported       Standing balance support: Bilateral upper extremity supported, During functional activity, No upper extremity supported, Single extremity supported Standing balance-Leahy Scale: Fair                             ADL either performed or assessed with clinical judgement   ADL Overall ADL's : Needs assistance/impaired Eating/Feeding: Sitting;Set up   Grooming: Sitting;Set up                   Toilet Transfer: Min guard;Rolling walker (2 wheels) Toilet Transfer Details (indicate cue type and reason): simulated                Extremity/Trunk Assessment              Vision       Perception     Praxis      Cognition Arousal/Alertness: Awake/alert Behavior During Therapy: WFL for tasks assessed/performed Overall Cognitive Status: Within Functional Limits for tasks assessed  Exercises Other Exercises Other Exercises: STS 10 attempts with CGA, stand 1 minute 2 attempts with supervision    Shoulder Instructions       General Comments      Pertinent Vitals/ Pain       Pain Assessment Pain Assessment: No/denies pain  Home Living                                          Prior Functioning/Environment              Frequency           Progress Toward Goals  OT Goals(current goals can now be found in the care plan  section)  Progress towards OT goals: Progressing toward goals     Plan Discharge plan remains appropriate;Frequency remains appropriate    Co-evaluation                 AM-PAC OT "6 Clicks" Daily Activity     Outcome Measure   Help from another person eating meals?: None Help from another person taking care of personal grooming?: None Help from another person toileting, which includes using toliet, bedpan, or urinal?: A Little   Help from another person to put on and taking off regular upper body clothing?: None Help from another person to put on and taking off regular lower body clothing?: A Lot 6 Click Score: 17    End of Session Equipment Utilized During Treatment: Rolling walker (2 wheels)  OT Visit Diagnosis: Muscle weakness (generalized) (M62.81)   Activity Tolerance Patient tolerated treatment well   Patient Left in chair;with call bell/phone within reach;with chair alarm set   Nurse Communication Mobility status        Time: 1422-1442 OT Time Calculation (min): 20 min  Charges: OT General Charges $OT Visit: 1 Visit OT Treatments $Therapeutic Activity: 8-22 mins  Anna Unwin, OTD OTR/L  12/14/21, 3:44 PM  

## 2021-12-14 NOTE — Progress Notes (Signed)
PT tolerated tx well, ran for 3.5 hrs.  UF = 1500 ml Vs 117/47 ; hr 73; rr 17 Report given to floor R.N. Cvc hep locked and capped

## 2021-12-14 NOTE — Progress Notes (Addendum)
Progress Note  Patient Name: April [name] Date of Encounter: 12/14/2021  [facility] Cardiologist: Ida Rogue, MD   Subjective   Patient seen on AM rounds. Currently on hemodialysis. Denies any chest pain or worsening shortness of breath. -1.5 L removed with dialysis today.  Inpatient Medications    Scheduled Meds:  amiodarone  200 mg Oral Daily   Chlorhexidine Gluconate Cloth  6 each Topical Q0600   darbepoetin (ARANESP) injection - NON-DIALYSIS  100 mcg Subcutaneous Q Fri-1800   docusate sodium  100 mg Oral BID   heparin injection (subcutaneous)  5,000 Units Subcutaneous Q8H   heparin sodium (porcine)       lactulose  20 g Oral BID   lidocaine  2 patch Transdermal Q24H   pantoprazole  40 mg Oral BID   rifaximin  200 mg Oral BID   sodium chloride flush  10-40 mL Intracatheter Q12H   Continuous Infusions:  sodium chloride Stopped (12/03/21 1136)   promethazine (PHENERGAN) injection (IM or IVPB) 12.5 mg (12/05/21 1301)   PRN Meds: sodium chloride, acetaminophen, diclofenac Sodium, heparin sodium (porcine), nitroGLYCERIN, ondansetron (ZOFRAN) IV, promethazine (PHENERGAN) injection (IM or IVPB), sodium chloride flush   Vital Signs    Vitals:   12/14/21 1000 12/14/21 1030 12/14/21 1126 12/14/21 1135  BP: (!) 122/44 (!) 109/53 (!) 110/54 (!) 117/47  Pulse: 78 78    Resp: '17 16  17  '$ Temp:   98.4 F (36.9 C)   TempSrc:   Oral   SpO2: 97% 97%    Weight:      Height:        Intake/Output Summary (Last 24 hours) at 12/14/2021 1147 Last data filed at 12/14/2021 1126 Gross per 24 hour  Intake 600 ml  Output 2100 ml  Net -1500 ml      12/14/2021    6:00 AM 12/13/2021    4:51 AM 12/12/2021    8:09 PM  Last 3 Weights  Weight (lbs) 192 lb 3.9 oz 191 lb 5.8 oz 183 lb 6.8 oz  Weight (kg) 87.2 kg 86.8 kg 83.2 kg      Telemetry     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   Neck: No JVD Cardiac: RRR, no murmurs, rubs, or gallops.  Respiratory: Clear to auscultation bilaterally.Respirations are unlabored on room air. GI: Soft, nontender, non-distended  MS: 1+ edema to bilateral lower extremities( chronic); No deformity. Neuro:  Nonfocal  Psych: Normal affect   Labs    High Sensitivity Troponin:   Recent Labs  Lab 11/23/21 1514 11/23/21 1714  TROPONINIHS 18* 21*     Chemistry Recent Labs  Lab 12/11/21 0434 12/12/21 0540 12/13/21 0440 12/14/21 0517  NA 134* 132* 133* 133*  K 3.4* 3.2* 3.2* 3.2*  CL 100 99 100 101  CO2 '28 25 26 25  '$ GLUCOSE 109* 100* 99 100*  BUN 32* 43* 28* 42*  CREATININE 2.57* 2.92* 1.94* 2.37*  CALCIUM 8.0* 8.3* 8.2* 8.2*  MG 1.9 2.0 1.7 2.0  PROT 5.4*  --   --   --   ALBUMIN 2.3* 2.2* 2.2* 2.1*  AST 93*  --   --   --   ALT 36  --   --   --   ALKPHOS 80  --   --   --   BILITOT 1.4*  --   --   --  GFRNONAA 18* 15* 25* 20*  ANIONGAP '6 8 7 7    '$ Lipids No results for input(s): "CHOL", "TRIG", "HDL", "LABVLDL", "LDLCALC", "CHOLHDL" in the last 168 hours.  Hematology Recent Labs  Lab 12/12/21 0540 12/13/21 0440 12/14/21 0517  WBC 10.6* 11.8* 11.7*  RBC 2.66* 2.87* 2.62*  HGB 7.8* 8.7* 8.1*  HCT 23.9* 25.9* 24.0*  MCV 89.8 90.2 91.6  MCH 29.3 30.3 30.9  MCHC 32.6 33.6 33.8  RDW 19.1* 20.1* 20.6*  PLT 133* 109* 105*   Thyroid No results for input(s): "TSH", "FREET4" in the last 168 hours.  BNPNo results for input(s): "BNP", "PROBNP" in the last 168 hours.  DDimer No results for input(s): "DDIMER" in the last 168 hours.   Radiology    US Abdomen Limited  Result Date: 12/12/2021 CLINICAL DATA:  Abdominal distension and cirrhosis. EXAM: LIMITED ABDOMEN ULTRASOUND FOR ASCITES TECHNIQUE: Limited ultrasound survey for ascites was performed in all four abdominal quadrants. COMPARISON:  11/08/2021.  CT 11/29/2020 FINDINGS: Small volume ascites identified within all 4 quadrants.  IMPRESSION: Small volume abdominal ascites. Electronically Signed   By: Abigail Miyamoto M.D.   On: 12/12/2021 13:41    Cardiac Studies   Echocardiogram completed 08/10/2021 1. Left ventricular ejection fraction, by estimation, is 60 to 65%. The  left ventricle has normal function. The left ventricle has no regional  wall motion abnormalities. Left ventricular diastolic parameters are  consistent with Grade II diastolic  dysfunction (pseudonormalization).   2. Right ventricular systolic function is normal. The right ventricular  size is mildly enlarged. There is mildly elevated pulmonary artery  systolic pressure. The estimated right ventricular systolic pressure is  42.7 mmHg.   3. Left atrial size was mildly dilated.   4. Right atrial size was mildly dilated.   5. The mitral valve is normal in structure. Mild mitral valve  regurgitation.   6. Tricuspid valve regurgitation is moderate.   7. The aortic valve is grossly normal. Aortic valve regurgitation is not  visualized.   8. The inferior vena cava is normal in size with greater than 50%  respiratory variability, suggesting right atrial pressure of 3 mmHg.    LHC 03/2016 Prox RCA lesion, 80 %stenosed. A STENT PROMUS PREM MR 3.5X16 (3.8-4.1 mm) drug eluting stent was successfully placed. Post intervention, there is a 0% residual stenosis. Ost RPDA lesion, 90 %stenosed. A STENT PROMUS PREM MR 2.5X12 (2.7 mm) drug eluting stent was successfully placed. Post intervention, there is a 0% residual stenosis. __________________________________ Prox RCA to Mid RCA lesion, 50 %stenosed. Dist RCA lesion, 30 %stenosed.- Appears smooth and chronic Dist LAD lesion, 25 %stenosed - diffuse disease in the distal LAD. Nonsignificant The left ventricular systolic function is normal. The left ventricular ejection fraction is 55-65% by visual estimate. There is no mitral valve regurgitation. There is no aortic valve stenosis.   Thankfully, the patient  presented chest pain-free indicating likely resolution of 100% occluded vessel. This was found to be the case with a thrombotic 80% proximal RCA as well as a very focal 90% ostial PDA lesion. Both these were treated with DES stents as non-Primary PCI. The digital mid RCA 50% stenosis was relatively smooth and appears to be chronic. This will be treated medically.   Preserved EF with mildly elevated LVEDP. Severe systemic hypertension noted.   Plan: Admit to TCU, step down cardiac unit for post MI/PCI care. TR band removal per protocol Continue Aggrastat infusion until current bag complete Aspirin plus Brilinta for minimum 3  months. At which time could consider stopping aspirin. Restart home dose of carvedilol and losartan.  As she is on furosemide, would hold HCTZ. Increase home Crestor dose to 40 mg.   Expect Fast-Track Discharge    Coronary Diagrams   Diagnostic Dominance: Right Intervention      Patient Profile     81 y.o. female with CAD s/p PCI. PAF not an anticoagulant due to h/o gi bleed, HTN, HLD, DM, CKD, who is being seen and evaluated for chronic diastolic heart failure.  Assessment & Plan    HFpEF - -1.5 L out with HD today - fluid removal will continue to be managed by dialysis - noted worsening renal function with diuresis - shortness of breath and edema continues to improve - daily weight, I&O, low sodium diet - daily bmp  CAD - remains chest pain free - s/p RCA PCI - unable to tolerate statin therapy 2/2 cirrhosis - unable to tolerate asa h/o gi bleed - recently not on beta blocker due to recent hypotension  PAF - currently in sinus - continue amiodarone - not on anticoagulation 2/2 gi bleed  HTN - blood pressure 117/47 - continues to remain off of antihypertensives - vital signs per un it protocol  OSA - continue to use CPAP nightly  Cirrhosis - ammonia on arrival 159 - continue lactulose -  management per IM  Anemia  - hgb 8.1 -  received 1 unit of PRBC's on 12/11/2021 - recommend keeping hgb 8 or greater - daily cbc  8.   Hypokalemia - potassium 3.2 today and for the last 2 days - will reach out to nephrology about giving potassium, she has been receiving potassium in her dialysis bath as well - recommend keeping potassium level closer to 4 - daily bmp - monitor/trend/replace as needed     For questions or updates, please contact [employer] Please consult [email or web address] for contact info under        Signed, Rocklin Soderquist, NP  12/14/2021, 11:47 AM

## 2021-12-14 NOTE — Progress Notes (Deleted)
PT tolerated tx well, ran for 3.5 hrs.  UF = 1500 ml Vs 117/47 ; hr 73; rr 17 Was given Vanc during last hour of tx. Report given to floor R.N. Cvc hep locked and capped

## 2021-12-14 NOTE — Consult Note (Cosign Needed Addendum)
Consultation Note Date: 12/14/2021   Patient Name: April [name]  DOB: 1941/04/21  MRN: [id number]  Age / Sex: 81 y.o., female  PCP: Einar Pheasant, MD Referring Physician: Nolberto Hanlon, MD  Reason for Consultation: Establishing goals of care  HPI/Patient Profile: 81 y.o. female  with past medical history of DCHF, anemia, CAD, pAF (no AC d/t hx of GIB), CKD IV, HTN, erosive gastritis, DM2, HLD (no statin), COPD, OSA (CPAP HS), and morbid obesity admitted on 11/23/2021 with abdominal and leg swelling with weight gain.  Pt is being treated for cirrhosis, 3rd spacing/hypoalbuminemia, worsening renal failure and venous insufficiency.  This is patient's 4th admission in the last 6 months. This is day 20 of patient's hospitalization. PMT was consulted to discuss [place].  Clinical Assessment and Goals of Care: I have reviewed medical records including EPIC notes, labs and imaging, assessed the patient and then met with patient at bedside  to discuss diagnosis prognosis, GOC, EOL wishes, disposition and options.  I introduced Palliative Medicine as specialized medical care for people living with serious illness. It focuses on providing relief from the symptoms and stress of a serious illness. The goal is to improve quality of life for both the patient and the family.  Therapeutic silence and active listening provided for patient to share her thoughts and emotions regarding patient's current medical situation. She shares she is tired and just wants to rest. 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   As far as functional status PTA, patient says she was walking and had much more strength at home. She says she is weaker than she was when she was admitted. She is using a walker now, and was not PTA. She endorses following a restricted diet now but was not as careful with  her diet PTA. She also has tremors affecting her hands and speech which she says have been present for several months. She has an appt outpatient to address this.   I attempted to elicit values and goals of care important to the patient. She says she wants to get stronger so that she can go home.  We discussed pros and cons of HD. Patient understands that dialysis is necessary to pull fluid off of her body. While she says she is "drained and tired", she is in agreement to get a larger catheter placed tomorrow, 8/17, so that she can continue dialysis. Her goal is to get [facility] with [facility] as soon as possible.   Patient is facing treatment option decisions, advanced directive, and anticipatory care needs. PMT will continue [place] discussions. Our discussion was abbreviated today as patient requested that I return at a later time. She endorses feeling worn out from HD and is wanting to eat her lunch and speak with a friend over the phone.   I am on service tomorrow and plan to continue [place] discussions with patient then.     Primary Decision Maker PATIENT  Code Status/Advance Care Planning: Full code  Discharge  Planning: To Be Determined  Primary Diagnoses: Present on Admission:  Essential hypertension, benign  Paroxysmal atrial fibrillation (HCC)  Chronic diastolic CHF (congestive heart failure) (HCC)  Hypokalemia  Acute renal failure superimposed on stage 4 chronic kidney disease (HCC)  Chronic kidney disease (CKD), stage IV (severe) (HCC)  Chronic obstructive pulmonary disease, unspecified COPD type ([place])  Hyperlipidemia  Coronary artery disease involving native coronary artery of native heart with unstable angina pectoris [facility])   Physical Exam Vitals reviewed.  Constitutional:      General: She is not in acute distress.    Appearance: She is obese. She is not ill-appearing.  HENT:     Head: Normocephalic.     Mouth/Throat:     Mouth: Mucous membranes are moist.  Eyes:     Pupils: Pupils  are equal, round, and reactive to light.  Pulmonary:     Effort: Pulmonary effort is normal.  Abdominal:     Palpations: Abdomen is soft.  Musculoskeletal:     Cervical back: Normal range of motion.     Comments: Generalized weakness, MAETC  Skin:    General: Skin is warm.     Comments: Bilateral non-pitting LE edema  Neurological:     Mental Status: She is alert and oriented to person, place, and time.  Psychiatric:        Mood and Affect: Mood normal.        Behavior: Behavior normal.        Thought Content: Thought content normal.        Judgment: Judgment normal.     Palliative Assessment/Data: 60%     Thank you for this consult. Palliative medicine will continue to follow and assist holistically.   Time Total: 40 minutes Greater than 50%  of this time was spent counseling and coordinating care related to the above assessment and plan.  Signed by: Jordan Hawks, DNP, FNP-BC Palliative Medicine    Please contact Palliative Medicine Team phone at [phone] for questions and concerns.  For individual provider: See Shea Evans

## 2021-12-14 NOTE — Progress Notes (Signed)
PROGRESS NOTE    April [name]  UUV:[id number] DOB: 10/02/1940 DOA: 11/23/2021 PCP: Einar Pheasant, MD    Brief Narrative:  81 yo F with dCHF, AF on amiodarone no AC, CKD IV baseline 2-2.4 who presented for abdominal and leg swelling and weight gain.    Patient was initially thought to have CHF or nephrogenic edema, and so Nephrology were consulted and she was given escalating doses of Lasix.  Ultimately she developed hepatic encephalopathy, and it became clear her weight gain was ascites and hepatic swelling was a major contributor.    In the meantime, she failed to respond to diuretics, and her renal function worsened and she was started on dialysis    Consultants:  Nephrology  Procedures: Dialysis  Antimicrobials:      Subjective: Has no abd pain, sob, cp  Objective: Vitals:   12/14/21 1135 12/14/21 1152 12/14/21 1220 12/14/21 1526  BP: (!) 117/47  (!) 127/50 (!) 140/61  Pulse:   77 73  Resp: '17  20 18  '$ Temp:   98.4 F (36.9 C) 98.3 F (36.8 C)  TempSrc:      SpO2:   97% 100%  Weight:  85.9 kg    Height:        Intake/Output Summary (Last 24 hours) at 12/14/2021 1554 Last data filed at 12/14/2021 1126 Gross per 24 hour  Intake 240 ml  Output 1900 ml  Net -1660 ml   Filed Weights   12/13/21 0451 12/14/21 0600 12/14/21 1152  Weight: 86.8 kg 87.2 kg 85.9 kg    Examination: Calm, NAD Cta no w/r Reg s1/s2 no gallop Soft benign +bs No edema Aaoxox3 , no asterexis Mood and affect appropriate in current setting     Data Reviewed: I have personally reviewed following labs and imaging studies  CBC: Recent Labs  Lab 12/10/21 0555 12/11/21 0434 12/12/21 0540 12/13/21 0440 12/14/21 0517  WBC 16.1* 12.7* 10.6* 11.8* 11.7*  HGB 7.2* 6.8* 7.8* 8.7* 8.1*  HCT 20.7* 20.0* 23.9* 25.9* 24.0*  MCV 88.5 90.1 89.8 90.2 91.6  PLT 159 144* 133* 109* 474*   Basic Metabolic Panel: Recent Labs  Lab 12/10/21 0555 12/11/21 0434 12/12/21 0540  12/13/21 0440 12/14/21 0517  NA 133* 134* 132* 133* 133*  K 4.1 3.4* 3.2* 3.2* 3.2*  CL 100 100 99 100 101  CO2 '24 28 25 26 25  '$ GLUCOSE 100* 109* 100* 99 100*  BUN 44* 32* 43* 28* 42*  CREATININE 2.75* 2.57* 2.92* 1.94* 2.37*  CALCIUM 8.3* 8.0* 8.3* 8.2* 8.2*  MG 2.2 1.9 2.0 1.7 2.0  PHOS  --   --  3.6 2.8 3.1   GFR: Estimated Creatinine Clearance: 17.5 mL/min (A) (by C-G formula based on SCr of 2.37 mg/dL (H)). Liver Function Tests: Recent Labs  Lab 12/11/21 0434 12/12/21 0540 12/13/21 0440 12/14/21 0517  AST 93*  --   --   --   ALT 36  --   --   --   ALKPHOS 80  --   --   --   BILITOT 1.4*  --   --   --   PROT 5.4*  --   --   --   ALBUMIN 2.3* 2.2* 2.2* 2.1*   No results for input(s): "LIPASE", "AMYLASE" in the last 168 hours. Recent Labs  Lab 12/09/21 0857 12/14/21 1221  AMMONIA 159* 23   Coagulation Profile: Recent Labs  Lab 12/11/21 0434  INR 1.6*   Cardiac Enzymes: No results for  input(s): "CKTOTAL", "CKMB", "CKMBINDEX", "TROPONINI" in the last 168 hours. BNP (last 3 results) No results for input(s): "PROBNP" in the last 8760 hours. HbA1C: No results for input(s): "HGBA1C" in the last 72 hours. CBG: Recent Labs  Lab 12/09/21 0817  GLUCAP 118*   Lipid Profile: No results for input(s): "CHOL", "HDL", "LDLCALC", "TRIG", "CHOLHDL", "LDLDIRECT" in the last 72 hours. Thyroid Function Tests: No results for input(s): "TSH", "T4TOTAL", "FREET4", "T3FREE", "THYROIDAB" in the last 72 hours. Anemia Panel: No results for input(s): "VITAMINB12", "FOLATE", "FERRITIN", "TIBC", "IRON", "RETICCTPCT" in the last 72 hours. Sepsis Labs: Recent Labs  Lab 12/09/21 0920  LATICACIDVEN 2.7*    Recent Results (from the past 240 hour(s))  Culture, blood (Routine X 2) w Reflex to ID Panel     Status: None   Collection Time: 12/09/21  8:56 AM   Specimen: BLOOD  Result Value Ref Range Status   Specimen Description BLOOD LEFT HAND  Final   Special Requests   Final     BOTTLES DRAWN AEROBIC AND ANAEROBIC Blood Culture adequate volume   Culture   Final    NO GROWTH 5 DAYS Performed at [facility], [address] [place], [place] [address]    Report Status 12/14/2021 FINAL  Final  Culture, blood (Routine X 2) w Reflex to ID Panel     Status: None   Collection Time: 12/09/21  9:13 AM   Specimen: BLOOD  Result Value Ref Range Status   Specimen Description BLOOD LEFT ANTECUBITAL  Final   Special Requests   Final    BOTTLES DRAWN AEROBIC AND ANAEROBIC Blood Culture adequate volume   Culture   Final    NO GROWTH 5 DAYS Performed at [facility], [address] [place], [place] [address]    Report Status 12/14/2021 FINAL  Final         Radiology Studies: No results found.      Scheduled Meds:  amiodarone  200 mg Oral Daily   Chlorhexidine Gluconate Cloth  6 each Topical Q0600   darbepoetin (ARANESP) injection - NON-DIALYSIS  100 mcg Subcutaneous Q Fri-1800   docusate sodium  100 mg Oral BID   heparin injection (subcutaneous)  5,000 Units Subcutaneous Q8H   heparin sodium (porcine)       lactulose  20 g Oral BID   lidocaine  2 patch Transdermal Q24H   pantoprazole  40 mg Oral BID   rifaximin  200 mg Oral BID   sodium chloride flush  10-40 mL Intracatheter Q12H   Continuous Infusions:  sodium chloride Stopped (12/03/21 1136)   promethazine (PHENERGAN) injection (IM or IVPB) 12.5 mg (12/05/21 1301)    Assessment & Plan:   Principal Problem:   Acute renal failure superimposed on stage 4 chronic kidney disease (HCC) Active Problems:   Anemia of chronic kidney failure, stage 4 (severe) (HCC)   Edema and ascites    Acute metabolic encephalopathy   Acute hepatic encephalopathy (HCC)   Cirrhosis (HCC)   Essential hypertension, benign   Hyperlipidemia   OSA on CPAP   Morbid obesity ([place])   Coronary artery disease involving native coronary artery of native heart with unstable angina pectoris (HCC)   Paroxysmal  atrial fibrillation (HCC)   Chronic obstructive pulmonary disease, unspecified COPD type (HCC)   Chronic diastolic CHF (congestive heart failure) (HCC)   Hypokalemia   Chronic kidney disease (CKD), stage IV (severe) (HCC)   Hyponatremia   Thrombocytopenia (HCC)   Acute renal failure superimposed  on stage 4 chronic kidney disease ([place]) S/p tunneled catheter 8/10  HD #1 8/11 HD #2 8/12 Minimal UOP again 200cc last 24  HD #3 8/14 planned Patient was started on dialysis secondary to fluid overload and mental status changes. Mentation has returned to baseline  8/16HD today Vascular surgery consulted for permacath placement tomorrow Next treatment Friday   Anemia of chronic kidney failure, stage 4 (severe) ([place]) Patient's Hgb has been trending down steadily over the last few months.  Certainly this is multifactorial with renal failure driving it, and chronic GI blood loss contribtuing.  Iron studies in June showed ferritin 24.  Got IV iron this admission.  Transfused 1 unit 8/13 for Hgb 6.8 g/dL.  Appropriate post-transfusion bump.  - Hold Eliquis 8/16 continue Aranesp       Edema and ascites  Cardiology consulted for ?right heart cath needed.  They repeated echo which ruled out a significant component of right heart failure/pHTN.  -Her swelling is multifactorial from cirrhosis, third spacing/hypoalbuminemia, worsening renal failure, venous insufficiency. Acute congestive heart failure ruled out.   US abdomen 8/14 showed mild ascites only, not enough to paracentesis at this point.  - If ascites worsened, would obtain paracentesis with albumin  8/16 fluid management with dialysis   Acute hepatic encephalopathy [facility]) Patient with acute encephalopathy 8/11, confused, somnolent.  Ammonia checked and it was up to 159.  Started on lactulose and now having 2-3 BMs per day, mentation improved. 8/16 MS at baseline, improved Ammonium 23 New lactulose and rifaximin    Acute metabolic  encephalopathy Due to hepatic encephalopathy   Cirrhosis ([place]) The patient has had radiographic evidence of cirrhosis for a long time, but appeared fairly well compensated.     However, the current illness appears to have decompensated it, and unmasked the extent to which cirrhosis was driving her edema.  This concurrent hepatic and renal failure portends a worse prognosis.   INR 1.6, MELD relatively low, but prognosis poor in setting of concurrent renal failure.    Thrombocytopenia ([place]) Platelets have been trending down.  This is due to cirrhosis.  Portends a worse prognosis.   Hyponatremia - Defer HD to Nephrology   Chronic kidney disease (CKD), stage IV (severe) ([place]) - Consult Nephrology   Hypokalemia Resolved    Chronic diastolic CHF (congestive heart failure) ([place]) See above, cardiology feel that acute CHF is ruled out. - Hold metolazone and torsemide - Consult Cardiology   Chronic obstructive pulmonary disease, unspecified COPD type ([place]) No active disease   Paroxysmal atrial fibrillation ([place]) Not on anticoagulation - Continue amiodarone   Coronary artery disease involving native coronary artery of native heart with unstable angina pectoris ([place]) No aspirin due to GI bleed.  No beta-blocker, ARB due to hypotension.  No statin due to cirrhosis.     Morbid obesity (HCC) BMI 36 with history hypertension, atherosclerosis   OSA on CPAP - CPAP at night   Hyperlipidemia Not on statin   Essential hypertension, benign - Hold torsemide     DVT prophylaxis: Heparin Code Status: Full Family Communication: None at bedside Disposition Plan:  Status is: Inpatient Remains inpatient appropriate because: IV treatment.  Getting hemodialysis.  Needs a permacath.        LOS: 20 days   Time spent: 35 min    Nolberto Hanlon, MD [facility] Pager [phone]  If 7PM-7AM, please contact night-coverage 12/14/2021, 3:54 PM

## 2021-12-14 NOTE — Progress Notes (Addendum)
Initial Nutrition Assessment  DOCUMENTATION CODES:   Obesity unspecified  INTERVENTION:   -Liberalize diet to 2 gram sodium for wider variety of meal selections -Renal MVI daily -Nepro Shake po BID, each supplement provides 425 kcal and 19 grams protein  -30 ml Prosource Plus BID, each supplement provides 100 kcals and 15 grams protein -Provided "Food Guide Pyramid for Healthy Eating with Renal Disease"; attached to AVS/ discharge summary  NUTRITION DIAGNOSIS:   Inadequate oral intake related to decreased appetite as evidenced by meal completion < 50%.  GOAL:   Patient will meet greater than or equal to 90% of their needs  MONITOR:   PO intake, Supplement acceptance  REASON FOR ASSESSMENT:   Consult Diet education  ASSESSMENT:   Pt with dCHF, AF on amiodarone no AC, CKD IV baseline 2-2.4 who presented for abdominal and leg swelling and weight gain.  Pt admitted with CHF.   8/10- HD access placed 8/11- first HD treatment  Reviewed I/O's: 0 ml x 24 hours and -2.4 L since 11/30/21  UOP: 600 ml x 24 hours  Per nephrology notes, plan for continued HD needs at discharge. Permacath placement scheduled for tomorrow. Next HD scheduled for 12/16/21 (last HD tx 12/14/21).   Pt sitting up in recliner chair at time of visit. She was talking on her phone at time of visit.   Pt currently on a renal diet with 1.2 L fluid restriction; pt with variable oral intake- meal completions 30-80%. Pt would benefit from addition of oral nutrition supplements.    Reviewed wt hx; pt has experienced a 1.6% wt loss over the past 3 months, which is not significant for time frame.   Palliative care following for goals of care.   Medications reviewed and include colace and lactulose.   Lab Results  Component Value Date   HGBA1C 6.3 09/21/2021   PTA DM medications are none.   Labs reviewed: Na: 133, K: 3.2, CBGS: 118 (inpatient orders for glycemic control are ).    NUTRITION - FOCUSED  PHYSICAL EXAM:  Flowsheet Row Most Recent Value  Orbital Region No depletion  Upper Arm Region No depletion  Thoracic and Lumbar Region No depletion  Buccal Region No depletion  Temple Region No depletion  Clavicle Bone Region No depletion  Clavicle and Acromion Bone Region No depletion  Scapular Bone Region No depletion  Dorsal Hand No depletion  Patellar Region No depletion  Anterior Thigh Region No depletion  Posterior Calf Region No depletion  Edema (RD Assessment) Mild  Hair Reviewed  Eyes Reviewed  Mouth Reviewed  Skin Reviewed  Nails Reviewed       Diet Order:   Diet Order             Diet NPO time specified Except for: Sips with Meds  Diet effective midnight           Diet renal with fluid restriction Fluid restriction: 1200 mL Fluid; Room service appropriate? Yes; Fluid consistency: Thin  Diet effective now                   EDUCATION NEEDS:   No education needs have been identified at this time  Skin:  Skin Assessment: Reviewed RN Assessment  Last BM:  12/14/21  Height:   Ht Readings from Last 1 Encounters:  11/23/21 5' (1.524 m)    Weight:   Wt Readings from Last 1 Encounters:  12/14/21 85.9 kg    Ideal Body Weight:  45.5 kg  BMI:  Body mass index is 36.98 kg/m.  Estimated Nutritional Needs:   Kcal:  1650-1850  Protein:  75-90 grams  Fluid:  1000 ml + UOP    Loistine Chance, RD, LDN, CDCES Registered Dietitian II Certified Diabetes Care and Education Specialist Please refer to [facility] for RD and/or RD on-call/weekend/after hours pager

## 2021-12-14 NOTE — Consult Note (Signed)
[facility] Vascular Consult Note  MRN : [id number]  MATEJA DIER is a 81 y.o. (18-Mar-1941) female who presents with chief complaint of  Chief Complaint  Patient presents with   Leg Swelling  .   Consulting Physician:Shantelle Gwyneth Revels, NP Reason for consult: End-stage renal disease History of Present Illness: The patient is an 81 year old female well-known to our practice for lymphedema.  She presented to [facility] due to abdominal leg swelling and weight gain.  The patient had a baseline creatinine of 2-2.4.  Initially the patient was diuresed but was not responsive and noted worsening kidney failure.  Ultimately the patient required temp cath placement on 12/11/2021.  It was felt that the patient will need to continue dialysis past or current admission and have requested for a PermCath placement.  Current Facility-Administered Medications  Medication Dose Route Frequency Provider Last Rate Last Admin   [START ON 12/15/2021] (feeding supplement) PROSource Plus liquid 30 mL  30 mL Oral BID BM Amery, Sahar, MD       0.9 %  sodium chloride infusion   Intravenous PRN Algernon Huxley, MD   Stopped at 12/03/21 1136   acetaminophen (TYLENOL) tablet 500 mg  500 mg Oral Q6H PRN Algernon Huxley, MD   500 mg at 12/10/21 1708   amiodarone (PACERONE) tablet 200 mg  200 mg Oral Daily Algernon Huxley, MD   200 mg at 12/14/21 1213   Chlorhexidine Gluconate Cloth 2 % PADS 6 each  6 each Topical Q0600 Colon Flattery, NP   6 each at 12/14/21 9675   Darbepoetin Alfa (ARANESP) injection 100 mcg  100 mcg Subcutaneous Q Fri-1800 Algernon Huxley, MD   100 mcg at 12/09/21 1707   diclofenac Sodium (VOLTAREN) 1 % topical gel 2 g  2 g Topical TID PRN Algernon Huxley, MD   2 g at 12/12/21 1353   docusate sodium (COLACE) capsule 100 mg  100 mg Oral BID Algernon Huxley, MD   100 mg at 12/13/21 2110   [START ON 12/15/2021] feeding supplement (NEPRO CARB STEADY) liquid 237 mL  237 mL Oral  BID BM Nolberto Hanlon, MD       heparin injection 5,000 Units  5,000 Units Subcutaneous Q8H Wynelle Cleveland, RPH   5,000 Units at 12/14/21 1423   lactulose (CHRONULAC) 10 GM/15ML solution 20 g  20 g Oral BID Edwin Dada, MD   20 g at 12/14/21 1212   lidocaine (LIDODERM) 5 % 2 patch  2 patch Transdermal Q24H Algernon Huxley, MD   2 patch at 12/11/21 1747   multivitamin (RENA-VIT) tablet 1 tablet  1 tablet Oral QHS Nolberto Hanlon, MD       nitroGLYCERIN (NITROSTAT) SL tablet 0.4 mg  0.4 mg Sublingual Q5 min PRN Algernon Huxley, MD       ondansetron [facility]) injection 4 mg  4 mg Intravenous Q6H PRN Algernon Huxley, MD   4 mg at 12/11/21 1144   pantoprazole (PROTONIX) EC tablet 40 mg  40 mg Oral BID Algernon Huxley, MD   40 mg at 12/14/21 1214   promethazine (PHENERGAN) 12.5 mg in sodium chloride 0.9 % 50 mL IVPB  12.5 mg Intravenous Q6H PRN Algernon Huxley, MD 200 mL/hr at 12/05/21 1301 12.5 mg at 12/05/21 1301   rifaximin (XIFAXAN) tablet 200 mg  200 mg Oral BID Edwin Dada, MD   200 mg at 12/14/21 1213   sodium  chloride flush (NS) 0.9 % injection 10-40 mL  10-40 mL Intracatheter Q12H Danford, Suann Larry, MD   10 mL at 12/14/21 1218   sodium chloride flush (NS) 0.9 % injection 10-40 mL  10-40 mL Intracatheter PRN Danford, Suann Larry, MD        Past Medical History:  Diagnosis Date   Anemia    CAD S/P PCI pRCA Promus DES 3.5 x 16 (4.1 mm), ostRPDA Promus DES 2.5 x 12 (2.7 mm) 03/25/2016   CAD S/P percutaneous coronary angioplasty    a. 07/2015 MV: No ischemia, EF 66%;  b. 03/2016 Inflat STEMI/PCI: LM nl, LAD 25d, RI nl, LCX nl, OM1/2 nl, RCA 80p (3.5x16 Promus Premier DES), 50p/m, 30d, RPDA 90 (2.5x12 Promu Premier DES); c. 01/2018 Cath ([place], [place]): Patent RCA stents->Med Rx.   CHF (congestive heart failure) ([place])    CKD (chronic kidney disease), stage III ([place])    Diabetes mellitus without complication ([place])    Diastolic dysfunction    a. 10/2013 Echo: EF 55-65%,  Gr1 DD; b. 01/2018 Echo ([place], [place]): EF 55-60%, Gr2 DD, RVSP 26mHg.   Diverticulitis    Dyspnea    Endometriosis    Family history of adverse reaction to anesthesia    Mother - had to stay in ICU because of breathing difficulties every time she had anesthesia   GERD (gastroesophageal reflux disease)    GI bleed    a. 06/2021 - melena-->2 u PRBCs.   Hiatal hernia    History of colon polyps 08/1994   History of Migraines    Hypercholesterolemia    Hypertension    LBBB (left bundle branch block)    Osteoporosis    PAF (paroxysmal atrial fibrillation) ([place]    a. 03/2016 Dx @ time of MI; b. CHA2DS2VASc = 5-->Eliquis.   Rheumatic fever    Sleep apnea    CPAP   Spasmodic dysphonia    ST elevation myocardial infarction (STEMI) of inferolateral wall, initial episode of care ([place] 03/25/2016   ST elevation myocardial infarction (STEMI) of inferolateral wall, initial episode of care ([facility] 03/25/2016   Urine incontinence     Past Surgical History:  Procedure Laterality Date   ABDOMINAL HYSTERECTOMY     ovaries not removed   APPENDECTOMY     was removed during hysterectomy   Breast biopsies     x2   BREAST EXCISIONAL BIOPSY Bilateral "years ago"   neg   BREAST SURGERY Bilateral    BROW LIFT Bilateral 08/15/2019   Procedure: BLEPHAROPLASTY UPPER EYELID; W/EXCESS SKIN BLEPHAROPTOSIS REPAIR; RESECT EX;  Surgeon: FKarle Starch MD;  [place]  Service: Ophthalmology;  Laterality: Bilateral;  sleep apnea   CARDIAC CATHETERIZATION  2011   moderate 40% RCA disease   CARDIAC CATHETERIZATION  01/2010   Dr. Gollan'@[facility]'$ : Only noted 40% RCA   CARDIAC CATHETERIZATION N/A 03/25/2016   Procedure: Left Heart Cath and Coronary Angiography;  Surgeon: D12/11/2015 MD;  [place] [facility];  Service: Cardiovascular;  Laterality: N/A;   CARDIAC CATHETERIZATION N/A 03/25/2016   Procedure: Coronary Stent Intervention;  Surgeon: D12/11/2015 MD;   [place] [facility];  Service: Cardiovascular;  Laterality: N/A;   COLONOSCOPY  2013   COLONOSCOPY WITH PROPOFOL N/A 07/11/2021   Procedure: COLONOSCOPY WITH PROPOFOL;  Surgeon: W3/26/2023 MD;  [facility];  Service: Endoscopy;  Laterality: N/A;   ESOPHAGOGASTRODUODENOSCOPY (EGD) WITH PROPOFOL N/A 03/17/2015   Procedure:  ESOPHAGOGASTRODUODENOSCOPY (EGD) WITH PROPOFOL;  Surgeon: Christene Lye, MD;  Location: [facility] ENDOSCOPY;  Service: Gastroenterology;  Laterality: N/A;   ESOPHAGOGASTRODUODENOSCOPY (EGD) WITH PROPOFOL N/A 07/10/2021   Procedure: ESOPHAGOGASTRODUODENOSCOPY (EGD) WITH PROPOFOL;  Surgeon: Lin Landsman, MD;  [facility] END[facility]COPY;  Service: Gastroenterology;  Laterality: N/A;   GIVENS CAPSULE STUDY N/A 07/11/2021   Procedure: GIVENS CAPSULE STUDY;  Surgeon: Lucilla Lame, MD;  Location: [facility] ENDOSCOPY;  Service: Endoscopy;  Laterality: N/A;   NM MYOVIEW ([place] HX)  07/2015   No evidence ischemia or infarction. EF 66%. Low risk   TEMPORARY DIALYSIS CATHETER N/A 12/08/2021   Procedure: TEMPORARY DIALYSIS CATHETER;  Surgeon: Algernon Huxley, MD;  Location: [place] CV LAB;  Service: Cardiovascular;  Laterality: N/A;   TONSILLECTOMY     TRANSTHORACIC ECHOCARDIOGRAM  10/2013   Normal LV size and function. EF 55-65%. GR 1 DD. Otherwise normal.    Social History Social History   Tobacco Use   Smoking status: Never   Smokeless tobacco: Never  Vaping Use   Vaping Use: Never used  Substance Use Topics   Alcohol use: No    Alcohol/week: 0.0 standard drinks of alcohol   Drug use: No    Family History Family History  Problem Relation Age of Onset   Arthritis Mother    Stroke Mother    Hypertension Mother    Osteoporosis Mother    Heart disease Father        MI   Heart attack Father    Stroke Brother    Heart attack Sister    Breast cancer Other        first cousin x 2   Stroke Daughter    Heart attack Daughter    Colon cancer Neg  Hx     Allergies  Allergen Reactions   Penicillins Other (See Comments)    Okay to take amoxicillin/(pt does not recall what the reaction to penicillin was (53-76 years old)    Sulfa Antibiotics Rash     REVIEW OF SYSTEMS (Negative unless checked)  Constitutional: '[]'$ Weight loss  '[]'$ Fever  '[]'$ Chills Cardiac: '[]'$ Chest pain   '[]'$ Chest pressure   '[]'$ Palpitations   '[]'$ Shortness of breath when laying flat   '[]'$ Shortness of breath at rest   '[]'$ Shortness of breath with exertion. Vascular:  '[]'$ Pain in legs with walking   '[]'$ Pain in legs at rest   '[]'$ Pain in legs when laying flat   '[]'$ Claudication   '[]'$ Pain in feet when walking  '[]'$ Pain in feet at rest  '[]'$ Pain in feet when laying flat   '[]'$ History of DVT   '[]'$ Phlebitis   '[]'$ Swelling in legs   '[]'$ Varicose veins   '[]'$ Non-healing ulcers Pulmonary:   '[]'$ Uses home oxygen   '[]'$ Productive cough   '[]'$ Hemoptysis   '[]'$ Wheeze  '[]'$ COPD   '[]'$ Asthma Neurologic:  '[]'$ Dizziness  '[]'$ Blackouts   '[]'$ Seizures   '[]'$ History of stroke   '[]'$ History of TIA  '[]'$ Aphasia   '[]'$ Temporary blindness   '[]'$ Dysphagia   '[]'$ Weakness or numbness in arms   '[]'$ Weakness or numbness in legs Musculoskeletal:  '[]'$ Arthritis   '[]'$ Joint swelling   '[]'$ Joint pain   '[]'$ Low back pain Hematologic:  '[]'$ Easy bruising  '[]'$ Easy bleeding   '[]'$ Hypercoagulable state   '[]'$ Anemic  '[]'$ Hepatitis Gastrointestinal:  '[]'$ Blood in stool   '[]'$ Vomiting blood  '[]'$ Gastroesophageal reflux/heartburn   '[]'$ Difficulty swallowing. Genitourinary:  '[]'$ Chronic kidney disease   '[]'$ Difficult urination  '[]'$ Frequent urination  '[]'$ Burning with urination   '[]'$ Blood in urine Skin:  '[]'$ Rashes   '[]'$ Ulcers   '[]'$ Wounds Psychological:  '[]'$   History of anxiety   '[]'$  History of major depression.  Physical Examination  Vitals:   12/14/21 1152 12/14/21 1220 12/14/21 1526 12/14/21 2053  BP:  (!) 127/50 (!) 140/61 107/67  Pulse:  77 73 67  Resp:  '20 18 18  '$ Temp:  98.4 F (36.9 C) 98.3 F (36.8 C) 98.2 F (36.8 C)  TempSrc:      SpO2:  97% 100% 99%  Weight: 85.9 kg     Height:       Body mass  index is 36.98 kg/m. Gen:  WD/WN, NAD Head: [place]/AT, No temporalis wasting. Prominent temp pulse not noted. Ear/Nose/Throat: Hearing grossly intact, nares w/o erythema or drainage, oropharynx w/o Erythema/Exudate Eyes: Sclera non-icteric, conjunctiva clear Neck: Trachea midline.  No JVD.  Pulmonary:  Good air movement, respirations not labored, equal bilaterally.  Cardiac: RRR, normal S1, S2. Vascular: No edema Vessel Right Left  Radial Palpable Palpable   Gastrointestinal: soft, non-tender/non-distended. No guarding/reflex.  Musculoskeletal: M/S 5/5 throughout.  Extremities without ischemic changes.  No deformity or atrophy. No edema. Neurologic: Sensation grossly intact in extremities.  Symmetrical.  Speech is fluent. Motor exam as listed above. Psychiatric: Judgment intact, Mood & affect appropriate for pt's clinical situation.     CBC Lab Results  Component Value Date   WBC 11.7 (H) 12/14/2021   HGB 8.1 (L) 12/14/2021   HCT 24.0 (L) 12/14/2021   MCV 91.6 12/14/2021   PLT 105 (L) 12/14/2021    BMET    Component Value Date/Time   NA 133 (L) 12/14/2021 0517   NA 147 (H) 04/29/2018 1058   K 3.2 (L) 12/14/2021 0517   K 3.9 03/23/2014 1617   CL 101 12/14/2021 0517   CO2 25 12/14/2021 0517   GLUCOSE 100 (H) 12/14/2021 0517   BUN 42 (H) 12/14/2021 0517   BUN 26 04/29/2018 1058   CREATININE 2.37 (H) 12/14/2021 0517   CALCIUM 8.2 (L) 12/14/2021 0517   GFRNONAA 20 (L) 12/14/2021 0517   GFRAA 42 (L) 04/29/2018 1058   Estimated Creatinine Clearance: 17.5 mL/min (A) (by C-G formula based on SCr of 2.37 mg/dL (H)).  COAG Lab Results  Component Value Date   INR 1.6 (H) 12/11/2021   INR 1.3 (H) 11/29/2021   INR 0.99 03/25/2016    Radiology US Abdomen Limited  Result Date: 12/12/2021 CLINICAL DATA:  Abdominal distension and cirrhosis. EXAM: LIMITED ABDOMEN ULTRASOUND FOR ASCITES TECHNIQUE: Limited ultrasound survey for ascites was performed in all four abdominal  quadrants. COMPARISON:  11/08/2021.  CT 11/29/2020 FINDINGS: Small volume ascites identified within all 4 quadrants. IMPRESSION: Small volume abdominal ascites. Electronically Signed   By: Abigail Miyamoto M.D.   On: 12/12/2021 13:41   DG Chest Port 1 View  Result Date: 12/09/2021 CLINICAL DATA:  Altered mental status. EXAM: PORTABLE CHEST 1 VIEW COMPARISON:  Chest radiographs 11/23/2021 FINDINGS: The cardiomediastinal silhouette is unchanged with normal heart size. A moderately large hiatal hernia is again noted. No airspace consolidation, edema, or pneumothorax is identified. Trace pleural effusions are questioned. No acute osseous abnormality is seen. IMPRESSION: Possible trace pleural effusions. No evidence of edema or pneumonia. Electronically Signed   By: Logan Bores M.D.   On: 12/09/2021 09:22   ECHOCARDIOGRAM COMPLETE  Result Date: 12/08/2021    ECHOCARDIOGRAM REPORT   Patient Name:   KRIYA WESTRA Date of Exam: 12/08/2021 Medical Rec #:  [id number]       Height:       60.0 in Accession #:  [id number]      Weight:       195.5 lb Date of Birth:  09-Aug-1940       BSA:          1.849 m Patient Age:    25 years        BP:           112/61 mmHg Patient Gender: F               HR:           63 bpm. Exam Location:  [facility] Procedure: 2D Echo, Cardiac Doppler and Color Doppler Indications:     Cardiomyopathy --Unspecified I 42.9  History:         Patient has prior history of Echocardiogram examinations, most                  recent 08/10/2021. CHF, Arrythmias:LBBB; Risk Factors:Diabetes.                  STEMI.  Sonographer:     Sherrie Sport Referring Phys:  [id number] CHRISTOPHER P DANFORD Diagnosing Phys: Kathlyn Sacramento MD IMPRESSIONS  1. Left ventricular ejection fraction, by estimation, is 55 to 60%. The left ventricle has normal function. The left ventricle has no regional wall motion abnormalities. Left ventricular diastolic parameters are consistent with Grade II diastolic dysfunction (pseudonormalization).  2.  Right ventricular systolic function is normal. The right ventricular size is normal. There is mildly elevated pulmonary artery systolic pressure. The estimated right ventricular systolic pressure is 31.5 mmHg.  3. Left atrial size was mildly dilated.  4. The mitral valve is normal in structure. No evidence of mitral valve regurgitation. No evidence of mitral stenosis.  5. The aortic valve is normal in structure. Aortic valve regurgitation is not visualized. Aortic valve sclerosis is present, with no evidence of aortic valve stenosis. FINDINGS  Left Ventricle: Left ventricular ejection fraction, by estimation, is 55 to 60%. The left ventricle has normal function. The left ventricle has no regional wall motion abnormalities. The left ventricular internal cavity size was normal in size. There is  borderline left ventricular hypertrophy. Left ventricular diastolic parameters are consistent with Grade II diastolic dysfunction (pseudonormalization). Right Ventricle: The right ventricular size is normal. No increase in right ventricular wall thickness. Right ventricular systolic function is normal. There is mildly elevated pulmonary artery systolic pressure. The tricuspid regurgitant velocity is 3.01  m/s, and with an assumed right atrial pressure of 5 mmHg, the estimated right ventricular systolic pressure is 17.6 mmHg. Left Atrium: Left atrial size was mildly dilated. Right Atrium: Right atrial size was normal in size. Pericardium: There is no evidence of pericardial effusion. Mitral Valve: The mitral valve is normal in structure. No evidence of mitral valve regurgitation. No evidence of mitral valve stenosis. Tricuspid Valve: The tricuspid valve is normal in structure. Tricuspid valve regurgitation is mild . No evidence of tricuspid stenosis. Aortic Valve: The aortic valve is normal in structure. Aortic valve regurgitation is not visualized. Aortic valve sclerosis is present, with no evidence of aortic valve stenosis.  Aortic valve mean gradient measures 9.3 mmHg. Aortic valve peak gradient measures 16.4 mmHg. Aortic valve area, by VTI measures 0.83 cm. Pulmonic Valve: The pulmonic valve was normal in structure. Pulmonic valve regurgitation is not visualized. No evidence of pulmonic stenosis. Aorta: The aortic root is normal in size and structure. Venous: The inferior vena cava was not well visualized. IAS/Shunts: No atrial level shunt detected by color flow  Doppler.  LEFT VENTRICLE PLAX 2D LVIDd:         4.60 cm   Diastology LVIDs:         3.20 cm   LV e' medial:    4.13 cm/s LV PW:         1.00 cm   LV E/e' medial:  23.8 LV IVS:        1.00 cm   LV e' lateral:   7.62 cm/s LVOT diam:     1.25 cm   LV E/e' lateral: 12.9 LV SV:         39 LV SV Index:   21 LVOT Area:     1.23 cm  RIGHT VENTRICLE RV Basal diam:  4.30 cm RV S prime:     14.10 cm/s TAPSE (M-mode): 3.3 cm LEFT ATRIUM             Index        RIGHT ATRIUM           Index LA diam:        4.10 cm 2.22 cm/m   RA Area:     21.00 cm LA Vol (A2C):   81.9 ml 44.30 ml/m  RA Volume:   59.50 ml  32.18 ml/m LA Vol (A4C):   79.4 ml 42.95 ml/m LA Biplane Vol: 86.6 ml 46.84 ml/m  AORTIC VALVE AV Area (Vmax):    0.69 cm AV Area (Vmean):   0.78 cm AV Area (VTI):     0.83 cm AV Vmax:           202.67 cm/s AV Vmean:          142.667 cm/s AV VTI:            0.465 m AV Peak Grad:      16.4 mmHg AV Mean Grad:      9.3 mmHg LVOT Vmax:         114.00 cm/s LVOT Vmean:        90.900 cm/s LVOT VTI:          0.314 m LVOT/AV VTI ratio: 0.67  AORTA Ao Root diam: 2.63 cm MITRAL VALVE               TRICUSPID VALVE MV Area (PHT): 3.27 cm    TR Peak grad:   36.2 mmHg MV Decel Time: 232 msec    TR Vmax:        301.00 cm/s MV E velocity: 98.50 cm/s MV A velocity: 99.00 cm/s  SHUNTS MV E/A ratio:  0.99        Systemic VTI:  0.31 m                            Systemic Diam: 1.25 cm Kathlyn Sacramento MD Electronically signed by Kathlyn Sacramento MD Signature Date/Time: 12/08/2021/5:41:58 PM    Final     PERIPHERAL VASCULAR CATHETERIZATION  Result Date: 12/08/2021 See surgical note for result.  US RENAL  Result Date: 12/06/2021 CLINICAL DATA:  Abdominal ultrasound 11/08/2021. Abdominopelvic CT 11/29/2020. EXAM: RENAL / URINARY TRACT ULTRASOUND COMPLETE COMPARISON:  None Available. FINDINGS: Right Kidney: Renal measurements: 9.8 x 3.8 x 4.3 cm = volume: 79.5 mL. Echogenicity within normal limits. No mass or hydronephrosis visualized. Left Kidney: Renal measurements: 10.4 x 4.5 x 3.5 cm = volume: 85.4 mL. Small cysts measuring up to 3.2 cm in the lower pole. No evidence of solid renal mass or hydronephrosis. Bladder:  Appears normal for degree of bladder distention. Other: Ascites noted. The visualized liver demonstrates contour irregularity, consistent with cirrhosis. IMPRESSION: 1. Both kidneys are similar in size, without hydronephrosis. 2. Hepatic cirrhosis and ascites. Electronically Signed   By: Richardean Sale M.D.   On: 12/06/2021 16:35   DG Chest 2 View  Result Date: 11/23/2021 CLINICAL DATA:  Shortness of breath, leg swelling EXAM: CHEST - 2 VIEW COMPARISON:  10/20/2021 FINDINGS: Cardiac size at the upper limit of normal. Aortic atherosclerosis. No focal pulmonary opacity. No pleural effusion or pneumothorax. Moderate hiatal hernia. No acute osseous abnormality. IMPRESSION: No acute cardiopulmonary process. Electronically Signed   By: Merilyn Baba M.D.   On: 11/23/2021 15:40      Assessment/Plan 1.  End-stage renal disease  Patient currently has a temp cath but will need a PermCath for ongoing dialysis at discharge.  I have discussed the benefits and alternatives with the patient.  She is agreeable to proceed.  Also discussed with patient that we will have her follow-up in 2 to 3 weeks with upper extremity vein mapping if it was determined that dialysis was necessary on a permanent basis.  However if the patient is found to not need to continue with dialysis, the dialysis center will  reach out to [place] about PermCath removal. 2.  Lymphedema  I suspect that the patient's fluid status will be better controlled now that she is able to undergo dialysis.  Patient will continue with conservative therapy including use of compression and elevation at discharge.    Family Communication: None at bedside  Thank you for allowing [place] to participate in the care of this patient.   Kris Hartmann, NP [facility] [phone] (Office Phone) [phone] (Office Fax) [phone] (Pager)  12/14/2021 9:46 PM  Staff may message me via secure chat in [place]  but this may not receive immediate response,  please page for urgent matters!  Dictation software was used to generate the above note. Typos may occur and escape review, as with typed/written notes. Any error is purely unintentional.  Please contact me directly for clarity if needed.

## 2021-12-15 ENCOUNTER — Encounter
Admission: EM | Disposition: A | Discharge status: Skilled Nursing Facility | Payer: Medicare Other | Source: Home / Self Care | Attending: Family Medicine

## 2021-12-15 DIAGNOSIS — K7682 Hepatic encephalopathy: Secondary | ICD-10-CM | POA: Diagnosis not present

## 2021-12-15 DIAGNOSIS — G9341 Metabolic encephalopathy: Secondary | ICD-10-CM | POA: Diagnosis not present

## 2021-12-15 DIAGNOSIS — N171 Acute kidney failure with acute cortical necrosis: Secondary | ICD-10-CM | POA: Diagnosis not present

## 2021-12-15 DIAGNOSIS — N184 Chronic kidney disease, stage 4 (severe): Secondary | ICD-10-CM | POA: Diagnosis not present

## 2021-12-15 DIAGNOSIS — Z992 Dependence on renal dialysis: Secondary | ICD-10-CM | POA: Diagnosis not present

## 2021-12-15 DIAGNOSIS — N186 End stage renal disease: Secondary | ICD-10-CM

## 2021-12-15 HISTORY — PX: DIALYSIS/PERMA CATHETER INSERTION: CATH118288

## 2021-12-15 LAB — MAGNESIUM: Magnesium: 1.7 mg/dL (ref 1.7–2.4)

## 2021-12-15 SURGERY — DIALYSIS/PERMA CATHETER INSERTION
Anesthesia: Moderate Sedation

## 2021-12-15 MED ORDER — FENTANYL CITRATE (PF) 100 MCG/2ML IJ SOLN
INTRAMUSCULAR | Status: AC
  Filled 2021-12-15: qty 2

## 2021-12-15 MED ORDER — LIDOCAINE-EPINEPHRINE (PF) 1 %-1:200000 IJ SOLN
INTRAMUSCULAR | Status: DC | PRN
  Administered 2021-12-15: 20 mL

## 2021-12-15 MED ORDER — CEFAZOLIN SODIUM-DEXTROSE 2-4 GM/100ML-% IV SOLN
INTRAVENOUS | Status: AC
  Filled 2021-12-15: qty 100

## 2021-12-15 MED ORDER — MIDAZOLAM HCL 2 MG/2ML IJ SOLN
INTRAMUSCULAR | Status: AC
  Filled 2021-12-15: qty 2

## 2021-12-15 MED ORDER — HEPARIN SODIUM (PORCINE) 1000 UNIT/ML IJ SOLN
INTRAMUSCULAR | Status: AC
  Filled 2021-12-15: qty 10

## 2021-12-15 MED ORDER — HYDROMORPHONE HCL 1 MG/ML IJ SOLN: 1.0000 mg | Freq: Once | INTRAMUSCULAR | Status: DC | PRN

## 2021-12-15 MED ORDER — MIDAZOLAM HCL 2 MG/2ML IJ SOLN
INTRAMUSCULAR | Status: DC | PRN
  Administered 2021-12-15: 1 mg via INTRAVENOUS

## 2021-12-15 MED ORDER — CIPROFLOXACIN IN D5W 400 MG/200ML IV SOLN
400.0000 mg | Freq: Once | INTRAVENOUS | Status: AC
  Administered 2021-12-15: 400 mg via INTRAVENOUS

## 2021-12-15 MED ORDER — ONDANSETRON HCL 4 MG/2ML IJ SOLN: 4.0000 mg | Freq: Four times a day (QID) | INTRAMUSCULAR | Status: DC | PRN

## 2021-12-15 MED ORDER — FENTANYL CITRATE (PF) 100 MCG/2ML IJ SOLN
INTRAMUSCULAR | Status: DC | PRN
  Administered 2021-12-15: 50 ug via INTRAVENOUS

## 2021-12-15 SURGICAL SUPPLY — 8 items
CATH CANNON HEMO 15FR 19 (HEMODIALYSIS SUPPLIES) IMPLANT
DERMABOND ADVANCED (GAUZE/BANDAGES/DRESSINGS) ×1
DERMABOND ADVANCED .7 DNX12 (GAUZE/BANDAGES/DRESSINGS) IMPLANT
PACK ANGIOGRAPHY (CUSTOM PROCEDURE TRAY) IMPLANT
SHEATH PROBE COVER 6X72 (BAG) IMPLANT
SUT MNCRL 4-0 (SUTURE) ×1
SUT MNCRL 4-0 27XMFL (SUTURE) ×1
SUTURE MNCRL 4-0 27XMF (SUTURE) IMPLANT

## 2021-12-15 NOTE — TOC Progression Note (Addendum)
Transition of Care [facility]) - Progression Note    Patient Details  Name: April [name] MRN: [id number] Date of Birth: [birth year]  Transition of Care [facility]) CM/SW Contact  Candie Chroman, LCSW Phone Number: 12/15/2021, 10:28 AM  Clinical Narrative:  Called son per RN request. He was asking if she would need to go to the ALF side at [facility]. Encouraged him to reach out to the facility about that. Son said she is set up with outpatient HD for TTS and he was told she would likely discharge Saturday.   10:50 am: Received call from [facility] admissions coordinator. They would like for her to come to the rehab side even if it's just for 5 days. Will discuss with patient when she returns to her room.  4:51 pm: Patient is willing to go to the SNF side at [facility].  Expected Discharge Plan: [place] Barriers to Discharge: Continued Medical Work up  Expected Discharge Plan and Services Expected Discharge Plan: [place] Choice: Resumption of Svcs/PTA Provider Living arrangements for the past 2 months: [place] Arranged: RN, PT, OT [facility] Agency: [place] ([facility]) Date HH Agency Contacted: 11/28/21   Representative spoke with at [place]: Floydene Flock   Social Determinants of Health (SDOH) Interventions    Readmission Risk Interventions    11/28/2021   11:17 AM 10/19/2021   12:27 PM  Readmission Risk Prevention Plan  Transportation Screening Complete Complete  PCP or Specialist Appt within 3-5 Days Complete Complete  HRI or [place] Complete Complete  Social Work Consult for [place] Planning/Counseling Complete Complete  Palliative Care Screening Not Applicable Complete  Medication Review [employer] photographer) Complete Complete

## 2021-12-15 NOTE — Progress Notes (Signed)
PT Cancellation Note  Patient Details Name: April [name] MRN: [id number] DOB: 08-07-1940   Cancelled Treatment:     Pt off floor for placement of Dialysis/Perma Catheter. Will continue next available date/time.  Josie Dixon 12/15/2021, 12:13 PM

## 2021-12-15 NOTE — Progress Notes (Signed)
Hd tx of 2.5hrs completed. 60L total vol processed. No complications. Report given to maureen claucas, rn. Total uf removed: 1040m Post hd wt: 83.1kgs Post hd v/s: 98.2 107/41(61) 75 18 97%

## 2021-12-15 NOTE — Progress Notes (Signed)
   12/12/21 1900 12/12/21 1930  Vitals  Temp  --  98.5 F (36.9 C)  Temp Source  --  Oral  BP (!) 116/57 125/60  MAP (mmHg) 74 79  BP Location Right Arm Right Arm  BP Method Automatic Automatic  Patient Position (if appropriate) Lying Lying  Pulse Rate 70 71  Pulse Rate Source Monitor Monitor  ECG Heart Rate 69 72  Resp 13 16  During Treatment Monitoring  Blood Flow Rate (mL/min) 300 mL/min  --   Arterial Pressure (mmHg) -110 mmHg  --   Venous Pressure (mmHg) 200 mmHg  --   TMP (mmHg) 7 mmHg  --   Ultrafiltration Rate (mL/min) 600 mL/min  --   Dialysate Flow Rate (mL/min) 300 ml/min  --   HD Safety Checks Performed Yes  --   Intra-Hemodialysis Comments Progressing as prescribed Tolerated well;Tx completed

## 2021-12-15 NOTE — Op Note (Signed)
OPERATIVE NOTE    PRE-OPERATIVE DIAGNOSIS: 1. ESRD   POST-OPERATIVE DIAGNOSIS: same as above  PROCEDURE: Ultrasound guidance for vascular access to the right internal jugular vein Fluoroscopic guidance for placement of catheter Placement of a 19 cm tip to cuff tunneled hemodialysis catheter via the right internal jugular vein  SURGEON: Leotis Pain, MD  ANESTHESIA:  Local with Moderate conscious sedation for approximately 26 minutes using 1 mg of Versed and 50 mcg of Fentanyl  ESTIMATED BLOOD LOSS: 15 cc  FLUORO TIME: less than one minute  CONTRAST: none  FINDING(S): 1.  Patent right internal jugular vein  SPECIMEN(S):  None  INDICATIONS:   April [name] is a 81 y.o.female who presents with renal failure.  The patient needs long term dialysis access for their ESRD, and a Permcath is necessary.  Risks and benefits are discussed and informed consent is obtained.    DESCRIPTION: After obtaining full informed written consent, the patient was brought back to the vascular suited. The patient's right neck and chest were sterilely prepped and draped in a sterile surgical field was created. Moderate conscious sedation was administered during a face to face encounter with the patient throughout the procedure with my supervision of the RN administering medicines and monitoring the patient's vital signs, pulse oximetry, telemetry and mental status throughout from the start of the procedure until the patient was taken to the recovery room.  The right internal jugular vein was visualized with ultrasound and found to be patent. It was then accessed under direct ultrasound guidance and a permanent image was recorded. A wire was placed. After skin nick and dilatation, the peel-away sheath was placed over the wire. I then turned my attention to an area under the clavicle. Approximately 1-2 fingerbreadths below the clavicle a small counterincision was created and tunneled from the subclavicular  incision to the access site. Using fluoroscopic guidance, a 19 centimeter tip to cuff tunneled hemodialysis catheter was selected, and tunneled from the subclavicular incision to the access site. It was then placed through the peel-away sheath and the peel-away sheath was removed. Using fluoroscopic guidance the catheter tips were parked in the right atrium. The appropriate distal connectors were placed. It withdrew blood well and flushed easily with heparinized saline and a concentrated heparin solution was then placed. It was secured to the chest wall with 2 Prolene sutures. The access incision was closed single 4-0 Monocryl. A 4-0 Monocryl pursestring suture was placed around the exit site. Sterile dressings were placed. The patient tolerated the procedure well and was taken to the recovery room in stable condition.  COMPLICATIONS: None  CONDITION: Stable  Leotis Pain, MD 12/15/2021 11:10 AM   This note was created with Dragon Medical transcription system. Any errors in dictation are purely unintentional.

## 2021-12-15 NOTE — Progress Notes (Signed)
   12/10/21 1030 12/10/21 1045 12/10/21 1100  Vitals  BP (!) 87/45 (!) 102/46 (!) 103/50  MAP (mmHg) (!) 58 (!) 63 (!) 64  Pulse Rate 81 79 79  ECG Heart Rate 81 80 80  Resp '14 14 13  '$ During Treatment Monitoring  Blood Flow Rate (mL/min) 250 mL/min  --  250 mL/min  Arterial Pressure (mmHg) -70 mmHg  --  -70 mmHg  Venous Pressure (mmHg) 120 mmHg  --  110 mmHg  TMP (mmHg) 14 mmHg  --  19 mmHg  Ultrafiltration Rate (mL/min) 748 mL/min  --  748 mL/min  Dialysate Flow Rate (mL/min) 300 ml/min  --  300 ml/min  HD Safety Checks Performed Yes  --  Yes  Intra-Hemodialysis Comments Progressing as prescribed  --  Progressing as prescribed    12/10/21 1114  Vitals  BP  --   MAP (mmHg)  --   Pulse Rate 81  ECG Heart Rate 83  Resp 15  During Treatment Monitoring  Blood Flow Rate (mL/min) 250 mL/min  Arterial Pressure (mmHg)  --   Venous Pressure (mmHg)  --   TMP (mmHg)  --   Ultrafiltration Rate (mL/min)  --   Dialysate Flow Rate (mL/min)  --   HD Safety Checks Performed  --   Intra-Hemodialysis Comments Tx completed

## 2021-12-15 NOTE — Interval H&P Note (Signed)
History and Physical Interval Note:  12/15/2021 10:45 AM  April [name]  has presented today for surgery, with the diagnosis of End stage renal disease.  The various methods of treatment have been discussed with the patient and family. After consideration of risks, benefits and other options for treatment, the patient has consented to  Procedure(s): DIALYSIS/PERMA CATHETER INSERTION (N/A) as a surgical intervention.  The patient's history has been reviewed, patient examined, no change in status, stable for surgery.  I have reviewed the patient's chart and labs.  Questions were answered to the patient's satisfaction.     Leotis Pain

## 2021-12-15 NOTE — Progress Notes (Signed)
   12/14/21 1100 12/14/21 1126  Vitals  Temp  --  98.4 F (36.9 C)  Temp Source  --  Oral  BP  --  (!) 110/54  BP Location Right Arm Right Arm  BP Method Automatic Automatic  Patient Position (if appropriate) Lying Lying  Pulse Rate Source Monitor  --   During Treatment Monitoring  Blood Flow Rate (mL/min) 400 mL/min  --   Arterial Pressure (mmHg) -160 mmHg  --   Venous Pressure (mmHg) 180 mmHg  --   TMP (mmHg) 2 mmHg  --   Ultrafiltration Rate (mL/min) 593 mL/min  --   Dialysate Flow Rate (mL/min) 300 ml/min  --   HD Safety Checks Performed Yes  --   Intra-Hemodialysis Comments Progressing as prescribed Tx completed

## 2021-12-15 NOTE — Progress Notes (Signed)
   12/10/21 0842 12/10/21 0900 12/10/21 0907  Vitals  BP  --  (!) 95/39 (!) 76/40  MAP (mmHg)  --  (!) 58 (!) 49  Pulse Rate 73 72 80  ECG Heart Rate 72 72 81  Resp '16 13 14  '$ During Treatment Monitoring  Blood Flow Rate (mL/min) 250 mL/min 250 mL/min 250 mL/min  Arterial Pressure (mmHg) -60 mmHg -70 mmHg -70 mmHg  Venous Pressure (mmHg) 120 mmHg 110 mmHg 110 mmHg  TMP (mmHg) 14 mmHg 9 mmHg 6 mmHg  Ultrafiltration Rate (mL/min) 480 mL/min 480 mL/min 0 mL/min (low BP UF off)  Dialysate Flow Rate (mL/min) 300 ml/min 300 ml/min 300 ml/min  HD Safety Checks Performed Yes Yes Yes  Intra-Hemodialysis Comments Tx initiated Progressing as prescribed Progressing as prescribed    12/10/21 0930 12/10/21 0953 12/10/21 1000  Vitals  BP (!) 100/45 (!) 94/54 (!) 101/39  MAP (mmHg) (!) 62 66 (!) 57  Pulse Rate 80 77 77  ECG Heart Rate 80 77 79  Resp '14 11 10  '$ During Treatment Monitoring  Blood Flow Rate (mL/min) 250 mL/min 250 mL/min 250 mL/min  Arterial Pressure (mmHg) -70 mmHg -70 mmHg -70 mmHg  Venous Pressure (mmHg) 130 mmHg 120 mmHg 120 mmHg  TMP (mmHg) 6 mmHg 13 mmHg 14 mmHg  Ultrafiltration Rate (mL/min) 0 mL/min 748 mL/min 748 mL/min  Dialysate Flow Rate (mL/min) 300 ml/min 300 ml/min 300 ml/min  HD Safety Checks Performed Yes Yes Yes  Intra-Hemodialysis Comments Progressing as prescribed Progressing as prescribed Progressing as prescribed

## 2021-12-15 NOTE — Progress Notes (Signed)
   12/12/21 1624 12/12/21 1630 12/12/21 1700  Vitals  BP (!) 138/59 133/64 (!) 121/50  MAP (mmHg) 80 82 70  BP Location Right Arm Right Arm Right Arm  BP Method Automatic Automatic Automatic  Patient Position (if appropriate) Lying Lying Lying  Pulse Rate 69 67 71  Pulse Rate Source Monitor Monitor Monitor  ECG Heart Rate 69 69 72  Resp '15 15 14  '$ During Treatment Monitoring  Blood Flow Rate (mL/min) 300 mL/min 300 mL/min 300 mL/min  Arterial Pressure (mmHg) -110 mmHg -110 mmHg -120 mmHg  Venous Pressure (mmHg) 170 mmHg 170 mmHg 170 mmHg  TMP (mmHg) 10 mmHg 10 mmHg 5 mmHg  Ultrafiltration Rate (mL/min) 600 mL/min 600 mL/min 600 mL/min  Dialysate Flow Rate (mL/min) 300 ml/min 300 ml/min 300 ml/min  HD Safety Checks Performed Yes Yes Yes  Intra-Hemodialysis Comments Tx initiated Progressing as prescribed Progressing as prescribed  Dialysis Fluid Bolus Normal Saline  --   --   Bolus Amount (mL) 300 mL  --   --     12/12/21 1730 12/12/21 1800 12/12/21 1830  Vitals  BP (!) 132/52 (!) 112/56 (!) 128/55  MAP (mmHg) 75 73 77  BP Location Right Arm Right Arm Right Arm  BP Method Automatic Automatic Automatic  Patient Position (if appropriate) Lying Lying Lying  Pulse Rate 68 71 69  Pulse Rate Source Monitor Monitor Monitor  ECG Heart Rate 69 72 69  Resp '12 14 13  '$ During Treatment Monitoring  Blood Flow Rate (mL/min) 300 mL/min 300 mL/min 300 mL/min  Arterial Pressure (mmHg) -120 mmHg -120 mmHg -110 mmHg  Venous Pressure (mmHg) 170 mmHg 170 mmHg 180 mmHg  TMP (mmHg) 50 mmHg 8 mmHg 8 mmHg  Ultrafiltration Rate (mL/min) 600 mL/min 600 mL/min 600 mL/min  Dialysate Flow Rate (mL/min) 300 ml/min 300 ml/min 300 ml/min  HD Safety Checks Performed Yes Yes Yes  Intra-Hemodialysis Comments Progressing as prescribed Progressing as prescribed Progressing as prescribed  Dialysis Fluid Bolus  --   --   --   Bolus Amount (mL)  --   --   --

## 2021-12-15 NOTE — Progress Notes (Signed)
PROGRESS NOTE    April [name]  NFA:[id number] DOB: 1940-12-28 DOA: 11/23/2021 PCP: Einar Pheasant, MD    Brief Narrative:  81 yo F with dCHF, AF on amiodarone no AC, CKD IV baseline 2-2.4 who presented for abdominal and leg swelling and weight gain.    Patient was initially thought to have CHF or nephrogenic edema, and so Nephrology were consulted and she was given escalating doses of Lasix.  Ultimately she developed hepatic encephalopathy, and it became clear her weight gain was ascites and hepatic swelling was a major contributor.    In the meantime, she failed to respond to diuretics, and her renal function worsened and she was started on dialysis  8/17 got her permacath today  Consultants:  Nephrology, vascular  Procedures: Dialysis  Antimicrobials:      Subjective: Complaining of pain where the permacath was placed no other complaints   Objective: Vitals:   12/14/21 2053 12/14/21 2303 12/15/21 0357 12/15/21 0750  BP: 107/67 (!) 151/53 (!) 113/52 (!) 92/40  Pulse: 67 74 71 63  Resp: '18 18 18 16  '$ Temp: 98.2 F (36.8 C) (!) 97.5 F (36.4 C) 98 F (36.7 C) 98.2 F (36.8 C)  TempSrc: Oral Oral Axillary   SpO2: 99% 98% 98% 96%  Weight:   84.3 kg   Height:        Intake/Output Summary (Last 24 hours) at 12/15/2021 0824 Last data filed at 12/14/2021 1846 Gross per 24 hour  Intake 720 ml  Output 1500 ml  Net -780 ml   Filed Weights   12/14/21 0600 12/14/21 1152 12/15/21 0357  Weight: 87.2 kg 85.9 kg 84.3 kg    Examination: Calm, NAD Cta no w/r Reg s1/s2 no gallop Soft benign +bs No edema Aaoxox3  Mood and affect appropriate in current setting     Data Reviewed: I have personally reviewed following labs and imaging studies  CBC: Recent Labs  Lab 12/10/21 0555 12/11/21 0434 12/12/21 0540 12/13/21 0440 12/14/21 0517  WBC 16.1* 12.7* 10.6* 11.8* 11.7*  HGB 7.2* 6.8* 7.8* 8.7* 8.1*  HCT 20.7* 20.0* 23.9* 25.9* 24.0*  MCV 88.5 90.1 89.8 90.2  91.6  PLT 159 144* 133* 109* 469*   Basic Metabolic Panel: Recent Labs  Lab 12/10/21 0555 12/11/21 0434 12/12/21 0540 12/13/21 0440 12/14/21 0517 12/15/21 0437  NA 133* 134* 132* 133* 133*  --   K 4.1 3.4* 3.2* 3.2* 3.2*  --   CL 100 100 99 100 101  --   CO2 '24 28 25 26 25  '$ --   GLUCOSE 100* 109* 100* 99 100*  --   BUN 44* 32* 43* 28* 42*  --   CREATININE 2.75* 2.57* 2.92* 1.94* 2.37*  --   CALCIUM 8.3* 8.0* 8.3* 8.2* 8.2*  --   MG 2.2 1.9 2.0 1.7 2.0 1.7  PHOS  --   --  3.6 2.8 3.1  --    GFR: Estimated Creatinine Clearance: 17.3 mL/min (A) (by C-G formula based on SCr of 2.37 mg/dL (H)). Liver Function Tests: Recent Labs  Lab 12/11/21 0434 12/12/21 0540 12/13/21 0440 12/14/21 0517  AST 93*  --   --   --   ALT 36  --   --   --   ALKPHOS 80  --   --   --   BILITOT 1.4*  --   --   --   PROT 5.4*  --   --   --   ALBUMIN 2.3* 2.2* 2.2*  2.1*   No results for input(s): "LIPASE", "AMYLASE" in the last 168 hours. Recent Labs  Lab 12/09/21 0857 12/14/21 1221  AMMONIA 159* 23   Coagulation Profile: Recent Labs  Lab 12/11/21 0434  INR 1.6*   Cardiac Enzymes: No results for input(s): "CKTOTAL", "CKMB", "CKMBINDEX", "TROPONINI" in the last 168 hours. BNP (last 3 results) No results for input(s): "PROBNP" in the last 8760 hours. HbA1C: No results for input(s): "HGBA1C" in the last 72 hours. CBG: Recent Labs  Lab 12/09/21 0817  GLUCAP 118*   Lipid Profile: No results for input(s): "CHOL", "HDL", "LDLCALC", "TRIG", "CHOLHDL", "LDLDIRECT" in the last 72 hours. Thyroid Function Tests: No results for input(s): "TSH", "T4TOTAL", "FREET4", "T3FREE", "THYROIDAB" in the last 72 hours. Anemia Panel: No results for input(s): "VITAMINB12", "FOLATE", "FERRITIN", "TIBC", "IRON", "RETICCTPCT" in the last 72 hours. Sepsis Labs: Recent Labs  Lab 12/09/21 0920  LATICACIDVEN 2.7*    Recent Results (from the past 240 hour(s))  Culture, blood (Routine X 2) w Reflex to ID  Panel     Status: None   Collection Time: 12/09/21  8:56 AM   Specimen: BLOOD  Result Value Ref Range Status   Specimen Description BLOOD LEFT HAND  Final   Special Requests   Final    BOTTLES DRAWN AEROBIC AND ANAEROBIC Blood Culture adequate volume   Culture   Final    NO GROWTH 5 DAYS Performed at [facility], [address], [place], [place] [address]    Report Status 12/14/2021 FINAL  Final  Culture, blood (Routine X 2) w Reflex to ID Panel     Status: None   Collection Time: 12/09/21  9:13 AM   Specimen: BLOOD  Result Value Ref Range Status   Specimen Description BLOOD LEFT ANTECUBITAL  Final   Special Requests   Final    BOTTLES DRAWN AEROBIC AND ANAEROBIC Blood Culture adequate volume   Culture   Final    NO GROWTH 5 DAYS Performed at [facility], [address] [place], [place] [address]    Report Status 12/14/2021 FINAL  Final         Radiology Studies: No results found.      Scheduled Meds:  (feeding supplement) PROSource Plus  30 mL Oral BID BM   amiodarone  200 mg Oral Daily   Chlorhexidine Gluconate Cloth  6 each Topical Q0600   darbepoetin (ARANESP) injection - NON-DIALYSIS  100 mcg Subcutaneous Q Fri-1800   docusate sodium  100 mg Oral BID   feeding supplement (NEPRO CARB STEADY)  237 mL Oral BID BM   heparin injection (subcutaneous)  5,000 Units Subcutaneous Q8H   lactulose  20 g Oral BID   lidocaine  2 patch Transdermal Q24H   multivitamin  1 tablet Oral QHS   pantoprazole  40 mg Oral BID   rifaximin  200 mg Oral BID   sodium chloride flush  10-40 mL Intracatheter Q12H   Continuous Infusions:  sodium chloride Stopped (12/03/21 1136)   sodium chloride 10 mL/hr at 12/15/21 0543    ceFAZolin (ANCEF) IV     promethazine (PHENERGAN) injection (IM or IVPB) 12.5 mg (12/05/21 1301)    Assessment & Plan:   Principal Problem:   Acute renal failure superimposed on stage 4 chronic kidney disease (HCC) Active Problems:    Anemia of chronic kidney failure, stage 4 (severe) (HCC)   Edema and ascites    Acute metabolic encephalopathy   Acute hepatic encephalopathy (HCC)   Cirrhosis (  [place])   Essential hypertension, benign   Hyperlipidemia   OSA on CPAP   Morbid obesity (HCC)   Coronary artery disease involving native coronary artery of native heart with unstable angina pectoris (HCC)   Paroxysmal atrial fibrillation (HCC)   Chronic obstructive pulmonary disease, unspecified COPD type (HCC)   Chronic diastolic CHF (congestive heart failure) (HCC)   Hypokalemia   Chronic kidney disease (CKD), stage IV (severe) (HCC)   Hyponatremia   Thrombocytopenia (HCC)   Acute renal failure superimposed on stage 4 chronic kidney disease ([place]) S/p tunneled catheter 8/10  HD #1 8/11 HD #2 8/12 Minimal UOP again 200cc last 24  HD #3 8/14 planned Patient was started on dialysis secondary to fluid overload and mental status changes. Mentation has returned to baseline  8/16HD today 8/17 status post permacath, status post HD today   Anemia of chronic kidney failure, stage 4 (severe) (HCC) Patient's Hgb has been trending down steadily over the last few months.  Certainly this is multifactorial with renal failure driving it, and chronic GI blood loss contribtuing.  Iron studies in June showed ferritin 24.  Got IV iron this admission.  Transfused 1 unit 8/13 for Hgb 6.8 g/dL.  Appropriate post-transfusion bump.  - Hold Eliquis 8/17 continue aranesp       Edema and ascites  Cardiology consulted for ?right heart cath needed.  They repeated echo which ruled out a significant component of right heart failure/pHTN.  -Her swelling is multifactorial from cirrhosis, third spacing/hypoalbuminemia, worsening renal failure, venous insufficiency. Acute congestive heart failure ruled out.  -US abdomen 8/14 showed mild ascites only, not enough to paracentesis at this point.  - If ascites worsened, would obtain paracentesis with albumin   8/17 fluid management with dialysis      Acute hepatic encephalopathy [facility]) Patient with acute encephalopathy 8/11, confused, somnolent.  Ammonia checked and it was up to 159.  Started on lactulose and now having 2-3 BMs per day, mentation improved. 8/16 MS at baseline, improved Ammonium 23 8/17 continue lactulose and rifaximin       Acute metabolic encephalopathy Due to hepatic encephalopathy   Cirrhosis ([place]) The patient has had radiographic evidence of cirrhosis for a long time, but appeared fairly well compensated.     However, the current illness appears to have decompensated it, and unmasked the extent to which cirrhosis was driving her edema.  This concurrent hepatic and renal failure portends a worse prognosis.   INR 1.6, MELD relatively low, but prognosis poor in setting of concurrent renal failure.    Thrombocytopenia ([place]) Platelets have been trending down.  This is due to cirrhosis.  Portends a worse prognosis.   Hyponatremia - Defer HD to Nephrology   Chronic kidney disease (CKD), stage IV (severe) ([place]) - Consult Nephrology   Hypokalemia Resolved    Chronic diastolic CHF (congestive heart failure) ([place]) See above, cardiology feel that acute CHF is ruled out. - Hold metolazone and torsemide - Consult Cardiology   Chronic obstructive pulmonary disease, unspecified COPD type ([place]) No active disease   Paroxysmal atrial fibrillation ([place]) Not on anticoagulation - Continue amiodarone   Coronary artery disease involving native coronary artery of native heart with unstable angina pectoris ([place]) No aspirin due to GI bleed.  No beta-blocker, ARB due to hypotension.  No statin due to cirrhosis.     Morbid obesity (HCC) BMI 36 with history hypertension, atherosclerosis   OSA on CPAP - CPAP at night   Hyperlipidemia Not on statin  Essential hypertension, benign - Hold torsemide     DVT prophylaxis: Heparin Code Status: Full Family Communication:  Best friend at bedside Disposition Plan:  Status is: Inpatient Remains inpatient appropriate because: IV treatment.  Getting hemodialysis. S/p permacath. Needs clearance from nephrology and outpatient        LOS: 21 days   Time spent: 35 min    Nolberto Hanlon, MD [facility] Pager [phone]  If 7PM-7AM, please contact night-coverage 12/15/2021, 8:24 AM

## 2021-12-15 NOTE — Progress Notes (Signed)
[facility]  PROGRESS NOTE   Subjective:   Patient seen resting in bed, son at bedside Currently remains on BiPAP from overnight, removed for this conversation Currently n.p.o. for vascular procedure Concerned about possible bleeding associated with having PermCath placed  Objective:  Vital signs: Blood pressure (!) 109/43, pulse 76, temperature 98.9 F (37.2 C), temperature source Axillary, resp. rate 16, height 5' (1.524 m), weight 84.3 kg, last menstrual period 05/01/1972, SpO2 94 %.  Intake/Output Summary (Last 24 hours) at 12/15/2021 1034 Last data filed at 12/15/2021 0900 Gross per 24 hour  Intake 720 ml  Output 1600 ml  Net -880 ml    Filed Weights   12/14/21 0600 12/14/21 1152 12/15/21 0357  Weight: 87.2 kg 85.9 kg 84.3 kg     Physical Exam: General:  No acute distress  Head:  Normocephalic, atraumatic. Moist oral mucosal membranes  Eyes:  Anicteric  Lungs:   Clear to auscultation, normal effort  Heart:  S1S2 no rubs  Abdomen:   Soft, nontender, bowel sounds present  Extremities: 1+ peripheral edema.  Neurologic:  Awake, alert, following commands  Skin:  No lesions  Access: Right femoral temp cath    Basic Metabolic Panel: Recent Labs  Lab 12/10/21 0555 12/11/21 0434 12/12/21 0540 12/13/21 0440 12/14/21 0517 12/15/21 0437  NA 133* 134* 132* 133* 133*  --   K 4.1 3.4* 3.2* 3.2* 3.2*  --   CL 100 100 99 100 101  --   CO2 '24 28 25 26 25  '$ --   GLUCOSE 100* 109* 100* 99 100*  --   BUN 44* 32* 43* 28* 42*  --   CREATININE 2.75* 2.57* 2.92* 1.94* 2.37*  --   CALCIUM 8.3* 8.0* 8.3* 8.2* 8.2*  --   MG 2.2 1.9 2.0 1.7 2.0 1.7  PHOS  --   --  3.6 2.8 3.1  --      CBC: Recent Labs  Lab 12/10/21 0555 12/11/21 0434 12/12/21 0540 12/13/21 0440 12/14/21 0517  WBC 16.1* 12.7* 10.6* 11.8* 11.7*  HGB 7.2* 6.8* 7.8* 8.7* 8.1*  HCT 20.7* 20.0* 23.9* 25.9* 24.0*  MCV 88.5 90.1 89.8 90.2 91.6  PLT 159 144* 133* 109* 105*       Urinalysis: No results for input(s): "COLORURINE", "LABSPEC", "PHURINE", "GLUCOSEU", "HGBUR", "BILIRUBINUR", "KETONESUR", "PROTEINUR", "UROBILINOGEN", "NITRITE", "LEUKOCYTESUR" in the last 72 hours.  Invalid input(s): "APPERANCEUR"    Imaging: No results found.   Medications:    [MAR Hold] sodium chloride Stopped (12/03/21 1136)   sodium chloride 10 mL/hr at 12/15/21 0543   ceFAZolin     [MAR Hold] promethazine (PHENERGAN) injection (IM or IVPB) 12.5 mg (12/05/21 1301)    [MAR Hold] (feeding supplement) PROSource Plus  30 mL Oral BID BM   [MAR Hold] amiodarone  200 mg Oral Daily   [MAR Hold] Chlorhexidine Gluconate Cloth  6 each Topical Q0600   [MAR Hold] darbepoetin (ARANESP) injection - NON-DIALYSIS  100 mcg Subcutaneous Q Fri-1800   [MAR Hold] docusate sodium  100 mg Oral BID   [MAR Hold] feeding supplement (NEPRO CARB STEADY)  237 mL Oral BID BM   [MAR Hold] heparin injection (subcutaneous)  5,000 Units Subcutaneous Q8H   [MAR Hold] lactulose  20 g Oral BID   [MAR Hold] lidocaine  2 patch Transdermal Q24H   [MAR Hold] multivitamin  1 tablet Oral QHS   [MAR Hold] pantoprazole  40 mg Oral BID   [MAR Hold] rifaximin  200 mg Oral BID   [  MAR Hold] sodium chloride flush  10-40 mL Intracatheter Q12H    Assessment/ Plan:     Principal Problem:   Acute renal failure superimposed on stage 4 chronic kidney disease (HCC) Active Problems:   Essential hypertension, benign   Hyperlipidemia   OSA on CPAP   Morbid obesity (HCC)   Coronary artery disease involving native coronary artery of native heart with unstable angina pectoris (HCC)   Paroxysmal atrial fibrillation (HCC)   Chronic obstructive pulmonary disease, unspecified COPD type (HCC)   Chronic diastolic CHF (congestive heart failure) (HCC)   Hypokalemia   Chronic kidney disease (CKD), stage IV (severe) (HCC)   Hyponatremia   Anemia of chronic kidney failure, stage 4 (severe) (HCC)   Edema and ascites    Acute  metabolic encephalopathy   Acute hepatic encephalopathy (HCC)   Cirrhosis (HCC)   Thrombocytopenia ([place])  [name] is a 81 y.o.  female with past medical history including diastolic CHF, anemia, CAD, paroxysmal atrial fibrillation without anticoagulation.  GI bleed.  Chronic kidney disease stage IV.  Patient reports to ED with complaints of lower extremity edema.  Patient has been admitted for Acute CHF (congestive heart failure) ([place]) [I50.9] Acute decompensated heart failure ([place]) [I50.9]   #1: Acute kidney injury on CKD stage IV: Patient was started on dialysis secondary to fluid overload and mental status changes.    Received dialysis treatment yesterday, 1.5 L removed.  Will provide short dialysis treatment today to satisfy outpatient schedule.  Appreciate vascular surgery scheduling PermCath placement today.  After dialysis, will place order for removal of temp cath.  Appreciate renal navigator confirming outpatient clinic chair at [facility] on TTS schedule.  Monitoring discharge plan to determine start date.   #2: Anemia with chronic kidney disease: Hemoglobin decreased to 8.1 today. Continue weekly Aranesp.   #3: Congestive heart failure: Fluid management with dialysis.   LOS: [facility] 8/17/202310:34 AM

## 2021-12-15 NOTE — Progress Notes (Signed)
   12/14/21 0800 12/14/21 0830 12/14/21 0900  Vitals  BP (!) 121/49 (!) 101/47 (!) 130/44  MAP (mmHg) 72 65 69  BP Location Right Arm Right Arm Right Arm  BP Method Automatic Automatic Automatic  Patient Position (if appropriate) Lying Lying Lying  Pulse Rate 69 77 82  Pulse Rate Source Monitor Monitor Monitor  ECG Heart Rate 69 78 79  Resp '18 17 20  '$ During Treatment Monitoring  Blood Flow Rate (mL/min) 400 mL/min 400 mL/min 400 mL/min  Arterial Pressure (mmHg) -150 mmHg -160 mmHg -160 mmHg  Venous Pressure (mmHg) 190 mmHg 180 mmHg 180 mmHg  TMP (mmHg) 3 mmHg 1 mmHg 1 mmHg  Ultrafiltration Rate (mL/min) 686 mL/min 686 mL/min 686 mL/min  Dialysate Flow Rate (mL/min) 300 ml/min 300 ml/min 300 ml/min  HD Safety Checks Performed Yes Yes Yes  Intra-Hemodialysis Comments Tx initiated Progressing as prescribed Progressing as prescribed  Dialysis Fluid Bolus Normal Saline  --   --   Bolus Amount (mL) 300 mL  --   --     12/14/21 0930 12/14/21 1000 12/14/21 1030  Vitals  BP (!) 118/52 (!) 122/44 (!) 109/53  MAP (mmHg) 71 67 69  BP Location Right Arm Right Arm Right Arm  BP Method Automatic Automatic Automatic  Patient Position (if appropriate) Lying Lying Lying  Pulse Rate 75 78 78  Pulse Rate Source Monitor Monitor Monitor  ECG Heart Rate 75 78 80  Resp '15 17 16  '$ During Treatment Monitoring  Blood Flow Rate (mL/min) 400 mL/min 400 mL/min 400 mL/min  Arterial Pressure (mmHg) -160 mmHg -160 mmHg -160 mmHg  Venous Pressure (mmHg) 180 mmHg 180 mmHg 180 mmHg  TMP (mmHg) 1 mmHg 2 mmHg 1 mmHg  Ultrafiltration Rate (mL/min) 686 mL/min 686 mL/min 593 mL/min  Dialysate Flow Rate (mL/min) 300 ml/min 300 ml/min 300 ml/min  HD Safety Checks Performed Yes Yes Yes  Intra-Hemodialysis Comments Progressing as prescribed Progressing as prescribed Progressing as prescribed  Dialysis Fluid Bolus  --   --   --   Bolus Amount (mL)  --   --   --

## 2021-12-16 ENCOUNTER — Encounter: Payer: Self-pay | Admitting: Vascular Surgery

## 2021-12-16 DIAGNOSIS — K7682 Hepatic encephalopathy: Secondary | ICD-10-CM | POA: Diagnosis not present

## 2021-12-16 DIAGNOSIS — N171 Acute kidney failure with acute cortical necrosis: Secondary | ICD-10-CM | POA: Diagnosis not present

## 2021-12-16 DIAGNOSIS — R600 Localized edema: Secondary | ICD-10-CM | POA: Diagnosis not present

## 2021-12-16 DIAGNOSIS — I5032 Chronic diastolic (congestive) heart failure: Secondary | ICD-10-CM | POA: Diagnosis not present

## 2021-12-16 DIAGNOSIS — G9341 Metabolic encephalopathy: Secondary | ICD-10-CM | POA: Diagnosis not present

## 2021-12-16 DIAGNOSIS — Z66 Do not resuscitate: Secondary | ICD-10-CM

## 2021-12-16 DIAGNOSIS — N184 Chronic kidney disease, stage 4 (severe): Secondary | ICD-10-CM | POA: Diagnosis not present

## 2021-12-16 LAB — MAGNESIUM: Magnesium: 1.9 mg/dL (ref 1.7–2.4)

## 2021-12-16 LAB — HEMOGLOBIN AND HEMATOCRIT, BLOOD
HCT: 25.2 % — ABNORMAL LOW (ref 36.0–46.0)
Hemoglobin: 8.1 g/dL — ABNORMAL LOW (ref 12.0–15.0)

## 2021-12-16 MED ORDER — HYDROMORPHONE HCL 1 MG/ML IJ SOLN: 1.0000 mg | Freq: Every evening | INTRAMUSCULAR | Status: DC | PRN

## 2021-12-16 NOTE — Progress Notes (Signed)
PLACEMENT RESOLVED: [facility]  TTS 11:00am, Start 8/22 at 10:45am.

## 2021-12-16 NOTE — Progress Notes (Signed)
Assisted pt in initiating her home cpap

## 2021-12-16 NOTE — Care Management Important Message (Signed)
Important Message  Patient Details  Name: April [name] MRN: [id number] Date of Birth: 16-Mar-1941   Medicare Important Message Given:  Yes     Dannette Barbara 12/16/2021, 11:37 AM

## 2021-12-16 NOTE — Progress Notes (Addendum)
Palliative Care Progress Note, Assessment & Plan   Patient Name: April [name]       Date: 12/16/2021 DOB: [birth year]  Age: 81 y.o. MRN#: [id number] Attending Physician: Nolberto Hanlon, MD Primary Care Physician: Einar Pheasant, MD Admit Date: 11/23/2021  Reason for Consultation/Follow-up: Establishing goals of care  Subjective: Patient is sitting in the chair in no apparent distress.  She acknowledges my presence and is able to make her wishes known.  She has no acute complaints at this time.  No family is at bedside.  HPI: 81 y.o. female  with past medical history of DCHF, anemia, CAD, pAF (no AC d/t hx of GIB), CKD IV, HTN, erosive gastritis, DM2, HLD (no statin), COPD, OSA (CPAP HS), and morbid obesity admitted on 11/23/2021 with abdominal and leg swelling with weight gain.   Pt is being treated for cirrhosis, 3rd spacing/hypoalbuminemia, worsening renal failure and venous insufficiency.   This is patient's 4th admission in the last 6 months. This is day 22 of patient's hospitalization. PMT was consulted to discuss [place].  Summary of counseling/coordination of care: After reviewing the patient's chart and assessing the patient at bedside, I spoke with patient in regards to goals of care.  CODE STATUS, advanced care planning documentation, MOST form, with outpatient palliative services described.  I attempted to elicit goals and values important to the patient.  Patient states she is already a DO NOT RESUSCITATE, as outlined in her living will.  We reviewed that there is no copy of living will available in medical record.  As sure that patient's current CODE STATUS is a full code.  Discussed that in the event of a cardiopulmonary arrest and patient was to become pulseless and not breathing, patient would  not want CPR, defibrillation, or use of mechanical ventilation.  Patient concerned DNR/DNI.  Adjustments made to chart and attending and RN made aware.  Discussed MOST form. The patient outlined some of her wishes for the following treatment decisions, though NO MOST form was formally completed:  Cardiopulmonary Resuscitation: Do Not Attempt Resuscitation (DNR/No CPR)  Medical Interventions: Limited Additional Interventions: Use medical treatment, IV fluids and cardiac monitoring as indicated, DO NOT USE intubation or mechanical ventilation. May consider use of less invasive airway support such as BiPAP or CPAP. Also provide comfort measures. Transfer to the hospital if indicated. Avoid intensive care.   Antibiotics: Left Blank  IV Fluids: No IV fluids (provide other measures to ensure comfort)  Feeding Tube: No feeding tube    Copy of MOST form and of [facility] [facility] [employer] left with patient to review.  Encourage patient to complete MOST form prior to transfer to SNF to ensure goals and wishes remain clear.  Discussed risks and benefits associated with hemodialysis.  Patient shared she would like to continue with hemodialysis as long as it benefits her and allows her to have improved quality of life.  After discussing the above with patient, spoke with patient's son [name] over the phone.  Updates as above.  Weight is also in agreement that patient should remain a DNR/DNI.  When asked appropriate questions regarding disposition.  Palliative medicine team will continue to follow the patient throughout her hospitalization  and shadow her chart.  Patient and family have PMT contact information.  PMT will reengage at patient/family's request, if goals change, or if patient's health declines.  Code Status: DNR  Prognosis: Unable to determine  Discharge Planning: [place] for rehab with Palliative care service follow-up  Physical Exam Vitals reviewed.  Constitutional:       General: She is not in acute distress.    Appearance: She is obese. She is not ill-appearing.  HENT:     Head: Normocephalic.     Mouth/Throat:     Mouth: Mucous membranes are moist.  Eyes:     Pupils: Pupils are equal, round, and reactive to light.  Cardiovascular:     Rate and Rhythm: Tachycardia present.     Pulses: Normal pulses.  Pulmonary:     Effort: Pulmonary effort is normal.  Abdominal:     Palpations: Abdomen is soft.  Musculoskeletal:     Comments: Generalized weakness  Skin:    General: Skin is warm and dry.  Neurological:     Mental Status: She is alert and oriented to person, place, and time. Mental status is at baseline.  Psychiatric:        Mood and Affect: Mood normal.        Behavior: Behavior normal.        Thought Content: Thought content normal.        Judgment: Judgment normal.             Palliative Assessment/Data: 50%    Total Time 50 minutes  Greater than 50%  of this time was spent counseling and coordinating care related to the above assessment and plan.  Thank you for allowing the Palliative Medicine Team to assist in the care of this patient.  Pupukea Ilsa Iha, FNP-BC Palliative Medicine Team Team Phone # [phone]

## 2021-12-16 NOTE — Progress Notes (Signed)
PROGRESS NOTE    April [name]  VQQ:[id number] DOB: 06-28-1940 DOA: 11/23/2021 PCP: Einar Pheasant, MD    Brief Narrative:  81 yo F with dCHF, AF on amiodarone no AC, CKD IV baseline 2-2.4 who presented for abdominal and leg swelling and weight gain.    Patient was initially thought to have CHF or nephrogenic edema, and so Nephrology were consulted and she was given escalating doses of Lasix.  Ultimately she developed hepatic encephalopathy, and it became clear her weight gain was ascites and hepatic swelling was a major contributor.    In the meantime, she failed to respond to diuretics, and her renal function worsened and she was started on dialysis  8/17 got her permacath today 8/18 c/o permacath site pain. Would like something for pain at night to sleep  Consultants:  Nephrology, vascular  Procedures: Dialysis  Antimicrobials:      Subjective: No shortness of breath or chest pain abdominal pain   Objective: Vitals:   12/16/21 0326 12/16/21 0355 12/16/21 0827 12/16/21 1137  BP: (!) 107/49  (!) 105/48 (!) 129/55  Pulse: 70  69 74  Resp: '19  18 16  '$ Temp: 97.8 F (36.6 C)  98.1 F (36.7 C) 98 F (36.7 C)  TempSrc: Oral  Oral   SpO2: 96%   96%  Weight:  83.6 kg    Height:        Intake/Output Summary (Last 24 hours) at 12/16/2021 1337 Last data filed at 12/16/2021 1053 Gross per 24 hour  Intake 500 ml  Output 1000 ml  Net -500 ml   Filed Weights   12/15/21 0357 12/15/21 1536 12/16/21 0355  Weight: 84.3 kg 83.1 kg 83.6 kg    Examination: Calm, NAD Cta no w/r Reg s1/s2 no gallop Soft benign +bs +Le edema b/l Aaoxox3  Mood and affect appropriate in current setting     Data Reviewed: I have personally reviewed following labs and imaging studies  CBC: Recent Labs  Lab 12/10/21 0555 12/11/21 0434 12/12/21 0540 12/13/21 0440 12/14/21 0517  WBC 16.1* 12.7* 10.6* 11.8* 11.7*  HGB 7.2* 6.8* 7.8* 8.7* 8.1*  HCT 20.7* 20.0* 23.9* 25.9* 24.0*   MCV 88.5 90.1 89.8 90.2 91.6  PLT 159 144* 133* 109* 433*   Basic Metabolic Panel: Recent Labs  Lab 12/10/21 0555 12/11/21 0434 12/12/21 0540 12/13/21 0440 12/14/21 0517 12/15/21 0437 12/16/21 0421  NA 133* 134* 132* 133* 133*  --   --   K 4.1 3.4* 3.2* 3.2* 3.2*  --   --   CL 100 100 99 100 101  --   --   CO2 '24 28 25 26 25  '$ --   --   GLUCOSE 100* 109* 100* 99 100*  --   --   BUN 44* 32* 43* 28* 42*  --   --   CREATININE 2.75* 2.57* 2.92* 1.94* 2.37*  --   --   CALCIUM 8.3* 8.0* 8.3* 8.2* 8.2*  --   --   MG 2.2 1.9 2.0 1.7 2.0 1.7 1.9  PHOS  --   --  3.6 2.8 3.1  --   --    GFR: Estimated Creatinine Clearance: 17.2 mL/min (A) (by C-G formula based on SCr of 2.37 mg/dL (H)). Liver Function Tests: Recent Labs  Lab 12/11/21 0434 12/12/21 0540 12/13/21 0440 12/14/21 0517  AST 93*  --   --   --   ALT 36  --   --   --   [name]  80  --   --   --   BILITOT 1.4*  --   --   --   PROT 5.4*  --   --   --   ALBUMIN 2.3* 2.2* 2.2* 2.1*   No results for input(s): "LIPASE", "AMYLASE" in the last 168 hours. Recent Labs  Lab 12/14/21 1221  AMMONIA 23   Coagulation Profile: Recent Labs  Lab 12/11/21 0434  INR 1.6*   Cardiac Enzymes: No results for input(s): "CKTOTAL", "CKMB", "CKMBINDEX", "TROPONINI" in the last 168 hours. BNP (last 3 results) No results for input(s): "PROBNP" in the last 8760 hours. HbA1C: No results for input(s): "HGBA1C" in the last 72 hours. CBG: No results for input(s): "GLUCAP" in the last 168 hours.  Lipid Profile: No results for input(s): "CHOL", "HDL", "LDLCALC", "TRIG", "CHOLHDL", "LDLDIRECT" in the last 72 hours. Thyroid Function Tests: No results for input(s): "TSH", "T4TOTAL", "FREET4", "T3FREE", "THYROIDAB" in the last 72 hours. Anemia Panel: No results for input(s): "VITAMINB12", "FOLATE", "FERRITIN", "TIBC", "IRON", "RETICCTPCT" in the last 72 hours. Sepsis Labs: No results for input(s): "PROCALCITON", "LATICACIDVEN" in the last 168  hours.   Recent Results (from the past 240 hour(s))  Culture, blood (Routine X 2) w Reflex to ID Panel     Status: None   Collection Time: 12/09/21  8:56 AM   Specimen: BLOOD  Result Value Ref Range Status   Specimen Description BLOOD LEFT HAND  Final   Special Requests   Final    BOTTLES DRAWN AEROBIC AND ANAEROBIC Blood Culture adequate volume   Culture   Final    NO GROWTH 5 DAYS Performed at [facility], [address], [place], [place] [address]    Report Status 12/14/2021 FINAL  Final  Culture, blood (Routine X 2) w Reflex to ID Panel     Status: None   Collection Time: 12/09/21  9:13 AM   Specimen: BLOOD  Result Value Ref Range Status   Specimen Description BLOOD LEFT ANTECUBITAL  Final   Special Requests   Final    BOTTLES DRAWN AEROBIC AND ANAEROBIC Blood Culture adequate volume   Culture   Final    NO GROWTH 5 DAYS Performed at [facility], [address] [place], [place] [address]    Report Status 12/14/2021 FINAL  Final         Radiology Studies: PERIPHERAL VASCULAR CATHETERIZATION  Result Date: 12/15/2021 See surgical note for result.       Scheduled Meds:  (feeding supplement) PROSource Plus  30 mL Oral BID BM   amiodarone  200 mg Oral Daily   Chlorhexidine Gluconate Cloth  6 each Topical Q0600   darbepoetin (ARANESP) injection - NON-DIALYSIS  100 mcg Subcutaneous Q Fri-1800   docusate sodium  100 mg Oral BID   feeding supplement (NEPRO CARB STEADY)  237 mL Oral BID BM   heparin injection (subcutaneous)  5,000 Units Subcutaneous Q8H   lactulose  20 g Oral BID   lidocaine  2 patch Transdermal Q24H   multivitamin  1 tablet Oral QHS   pantoprazole  40 mg Oral BID   rifaximin  200 mg Oral BID   sodium chloride flush  10-40 mL Intracatheter Q12H   Continuous Infusions:  sodium chloride Stopped (12/03/21 1136)   promethazine (PHENERGAN) injection (IM or IVPB) 12.5 mg (12/05/21 1301)    Assessment & Plan:   Principal  Problem:   Acute renal failure superimposed on stage 4 chronic kidney disease (HCC) Active Problems:  Anemia of chronic kidney failure, stage 4 (severe) (HCC)   Edema and ascites    Acute metabolic encephalopathy   Acute hepatic encephalopathy (HCC)   Cirrhosis (HCC)   Essential hypertension, benign   Hyperlipidemia   OSA on CPAP   Morbid obesity (HCC)   Coronary artery disease involving native coronary artery of native heart with unstable angina pectoris (HCC)   Paroxysmal atrial fibrillation (HCC)   Chronic obstructive pulmonary disease, unspecified COPD type (HCC)   Chronic diastolic CHF (congestive heart failure) (HCC)   Hypokalemia   Chronic kidney disease (CKD), stage IV (severe) (HCC)   Hyponatremia   Thrombocytopenia (HCC)   Acute renal failure superimposed on stage 4 chronic kidney disease ([place]) S/p tunneled catheter 8/10  HD #1 8/11 HD #2 8/12 Minimal UOP again 200cc last 24  HD #3 8/14 planned Patient was started on dialysis secondary to fluid overload and mental status changes. Mentation has returned to baseline  8/18 status post permacath on 8/17 by Dr. Lucky Cowboy  Received short dialysis treatment yesterday to maintain outpatient schedule. Plan to DC temp cath removal 2 days Next dialysis treatment will be on Saturday We will navigator seeking outpatient treatment and placement    Anemia of chronic kidney failure, stage 4 (severe) (HCC) Patient's Hgb has been trending down steadily over the last few months.  Certainly this is multifactorial with renal failure driving it, and chronic GI blood loss contribtuing.  Iron studies in June showed ferritin 24.  Got IV iron this admission.  Transfused 1 unit 8/13 for Hgb 6.8 g/dL.  Appropriate post-transfusion bump.  - Hold Eliquis 8/18 continue Aranesp        Edema and ascites  Cardiology consulted for ?right heart cath needed.  They repeated echo which ruled out a significant component of right heart failure/pHTN.  -Her  swelling is multifactorial from cirrhosis, third spacing/hypoalbuminemia, worsening renal failure, venous insufficiency. Acute congestive heart failure ruled out.  -US abdomen 8/14 showed mild ascites only, not enough to paracentesis at this point.  - If ascites worsened, would obtain paracentesis with albumin  8/18 fluid management with dialysis     Acute hepatic encephalopathy [facility]) Patient with acute encephalopathy 8/11, confused, somnolent.  Ammonia checked and it was up to 159.  Started on lactulose and now having 2-3 BMs per day, mentation improved. 8/16 MS at baseline, improved Ammonium 23 8/18 continue lactulose and rifaximin        Acute metabolic encephalopathy Due to hepatic encephalopathy   Cirrhosis ([place]) The patient has had radiographic evidence of cirrhosis for a long time, but appeared fairly well compensated.     However, the current illness appears to have decompensated it, and unmasked the extent to which cirrhosis was driving her edema.  This concurrent hepatic and renal failure portends a worse prognosis.   INR 1.6, MELD relatively low, but prognosis poor in setting of concurrent renal failure.    Thrombocytopenia ([place]) Platelets have been trending down.  This is due to cirrhosis.  Portends a worse prognosis.   Hyponatremia - Defer HD to Nephrology   Chronic kidney disease (CKD), stage IV (severe) ([place]) - Consult Nephrology   Hypokalemia Resolved    Chronic diastolic CHF (congestive heart failure) ([place]) See above, cardiology feel that acute CHF is ruled out. - Hold metolazone and torsemide Volume mx per HD   Chronic obstructive pulmonary disease, unspecified COPD type ([place]) No active disease   Paroxysmal atrial fibrillation ([place]) Not on anticoagulation - Continue amiodarone  Coronary artery disease involving native coronary artery of native heart with unstable angina pectoris (HCC) No aspirin due to GI bleed.  No beta-blocker, ARB due to  hypotension.  No statin due to cirrhosis.     Morbid obesity (HCC) BMI 36 with history hypertension, atherosclerosis   OSA on CPAP - CPAP at night   Hyperlipidemia Not on statin   Essential hypertension, benign - Hold torsemide     DVT prophylaxis: Heparin Code Status: Full Family Communication: Best friend at bedside Disposition Plan: SNF Status is: Inpatient Remains inpatient appropriate because: IV treatment.  Getting hemodialysis. S/p permacath. Needs clearance from nephrology and outpatient setup for HD        LOS: 22 days   Time spent: 35 min    Nolberto Hanlon, MD [facility] Pager [phone]  If 7PM-7AM, please contact night-coverage 12/16/2021, 1:37 PM

## 2021-12-16 NOTE — Consult Note (Signed)
   [facility] Inpatient Consult   12/16/2021  April [name] 1940-12-18 [id number]  [facility] [ACO] Patient: Medicare ACO REACH  Primary Care Provider:  Einar Pheasant, [facility] Liaison review, patient at [facility]   Follow up:  LLOS 22 days with extreme high risk scores for unplanned readmission risk. Reviewed for progress and new HD needs  Patient has an Embedded provider with [employer].  Patient is currently for transitioning to a skilled nursing facility level of care at [facility] for disposition and reviewed for HD needs and plan.  Plan: Continue to follow  Natividad Brood, RN BSN [facility] Liaison  [phone] business mobile phone Toll free office [phone]  Fax number: [phone] [email or web address]

## 2021-12-16 NOTE — Progress Notes (Signed)
Physical Therapy Treatment Patient Details Name: April [name] MRN: [id number] DOB: 03/06/1941 Today's Date: 12/16/2021   History of Present Illness 81 y.o. female presented to hospital 7/26 with peripheral edema.  Pt admitted with acute on chronic diastolic CHF.  PMH CHF, anemia, sleep apnea, CAD s/p tenting, h/o paroxysmal a-fib (not on anticoagulation secondary to recurrent GI bleed), CKD, spasmodic dysphonia, STEMI, urine incontinence, B breast surgery. Patient was started on dialysis secondary to fluid overload and mental status changes.    PT Comments    Pt received seated in recliner upon arrival to room and pt agreeable to therapy.  Pt noted to be weaker in voice today upon arrival to the room.  Pt with family members, but they were leaving at beginning of session.  Pt noted that once up and mobile, she felt much weaker than she initially thought she would be.  Pt was able to ambulate around the nursing station with good technique one lap, but refused to go a second lap due to weakness she was feeling.  Pt left with all needs met in the recliner and call bell within reach.  Current discharge plans to home with HHPT remain appropriate at this time.  Pt will continue to benefit from skilled therapy in order to address deficits listed below.    Recommendations for follow up therapy are one component of a multi-disciplinary discharge planning process, led by the attending physician.  Recommendations may be updated based on patient status, additional functional criteria and insurance authorization.  Follow Up Recommendations  Home health PT     Assistance Recommended at Discharge Set up Supervision/Assistance  Patient can return home with the following A little help with bathing/dressing/bathroom;Assistance with cooking/housework;Assist for transportation;Help with stairs or ramp for entrance   Equipment Recommendations  Rolling walker (2 wheels);BSC/3in1    Recommendations for Other  Services       Precautions / Restrictions Precautions Precautions: Fall Precaution Comments: temp R femoral HD cath Restrictions Weight Bearing Restrictions: No     Mobility  Bed Mobility               General bed mobility comments: NT pt in recliner pre/post session    Transfers Overall transfer level: Needs assistance Equipment used: Rolling walker (2 wheels) Transfers: Sit to/from Stand Sit to Stand: Supervision           General transfer comment: Pt requested the use of the RW for increased ambulation.    Ambulation/Gait Ambulation/Gait assistance: Min guard Gait Distance (Feet): 160 Feet Assistive device: Rolling walker (2 wheels) Gait Pattern/deviations: Step-through pattern Gait velocity: decreased     General Gait Details: Pt moving slower today and notes that she feels extremely week at this time.   Stairs             Wheelchair Mobility    Modified Rankin (Stroke Patients Only)       Balance Overall balance assessment: Needs assistance Sitting-balance support: No upper extremity supported, Single extremity supported, Feet supported       Standing balance support: Bilateral upper extremity supported, During functional activity, No upper extremity supported, Single extremity supported Standing balance-Leahy Scale: Fair                              Cognition Arousal/Alertness: Awake/alert Behavior During Therapy: WFL for tasks assessed/performed Overall Cognitive Status: Within Functional Limits for tasks assessed  Exercises      General Comments        Pertinent Vitals/Pain      Home Living                          Prior Function            PT Goals (current goals can now be found in the care plan section) Acute Rehab PT Goals Patient Stated Goal: to go home PT Goal Formulation: With patient Time For Goal Achievement:  12/14/21 Potential to Achieve Goals: Good Progress towards PT goals: Progressing toward goals    Frequency    Min 2X/week      PT Plan Current plan remains appropriate    Co-evaluation              AM-PAC PT "6 Clicks" Mobility   Outcome Measure  Help needed turning from your back to your side while in a flat bed without using bedrails?: None Help needed moving from lying on your back to sitting on the side of a flat bed without using bedrails?: A Little Help needed moving to and from a bed to a chair (including a wheelchair)?: A Little Help needed standing up from a chair using your arms (e.g., wheelchair or bedside chair)?: A Little Help needed to walk in hospital room?: A Little Help needed climbing 3-5 steps with a railing? : A Little 6 Click Score: 19    End of Session Equipment Utilized During Treatment: Gait belt Activity Tolerance: Patient tolerated treatment well Patient left: with call bell/phone within reach;in chair;with chair alarm set Nurse Communication: Mobility status PT Visit Diagnosis: Unsteadiness on feet (R26.81);Muscle weakness (generalized) (M62.81);Other abnormalities of gait and mobility (R26.89)     Time: 2960-3905 PT Time Calculation (min) (ACUTE ONLY): 17 min  Charges:  $Gait Training: 8-22 mins                     Gwenlyn Saran, PT, DPT 12/16/21, 5:29 PM

## 2021-12-16 NOTE — Progress Notes (Signed)
[facility]  PROGRESS NOTE   Subjective:   Patient seen resting in bed, son at bedside Complains of soreness at PermCath placement site. No other complaints at this time Lower extremity edema remains, but improved  Objective:  Vital signs: Blood pressure (!) 129/55, pulse 74, temperature 98 F (36.7 C), resp. rate 16, height 5' (1.524 m), weight 83.6 kg, last menstrual period 05/01/1972, SpO2 96 %.  Intake/Output Summary (Last 24 hours) at 12/16/2021 1327 Last data filed at 12/16/2021 1053 Gross per 24 hour  Intake 500 ml  Output 1000 ml  Net -500 ml    Filed Weights   12/15/21 0357 12/15/21 1536 12/16/21 0355  Weight: 84.3 kg 83.1 kg 83.6 kg     Physical Exam: General:  No acute distress  Head:  Normocephalic, atraumatic. Moist oral mucosal membranes  Eyes:  Anicteric  Lungs:   Clear to auscultation, normal effort  Heart:  S1S2 no rubs  Abdomen:   Soft, nontender, bowel sounds present  Extremities: 1+ peripheral edema.  Neurologic:  Awake, alert, following commands  Skin:  No lesions  Access: Right femoral temp cath    Basic Metabolic Panel: Recent Labs  Lab 12/10/21 0555 12/11/21 0434 12/12/21 0540 12/13/21 0440 12/14/21 0517 12/15/21 0437 12/16/21 0421  NA 133* 134* 132* 133* 133*  --   --   K 4.1 3.4* 3.2* 3.2* 3.2*  --   --   CL 100 100 99 100 101  --   --   CO2 '24 28 25 26 25  '$ --   --   GLUCOSE 100* 109* 100* 99 100*  --   --   BUN 44* 32* 43* 28* 42*  --   --   CREATININE 2.75* 2.57* 2.92* 1.94* 2.37*  --   --   CALCIUM 8.3* 8.0* 8.3* 8.2* 8.2*  --   --   MG 2.2 1.9 2.0 1.7 2.0 1.7 1.9  PHOS  --   --  3.6 2.8 3.1  --   --      CBC: Recent Labs  Lab 12/10/21 0555 12/11/21 0434 12/12/21 0540 12/13/21 0440 12/14/21 0517  WBC 16.1* 12.7* 10.6* 11.8* 11.7*  HGB 7.2* 6.8* 7.8* 8.7* 8.1*  HCT 20.7* 20.0* 23.9* 25.9* 24.0*  MCV 88.5 90.1 89.8 90.2 91.6  PLT 159 144* 133* 109* 105*      Urinalysis: No results for input(s):  "COLORURINE", "LABSPEC", "PHURINE", "GLUCOSEU", "HGBUR", "BILIRUBINUR", "KETONESUR", "PROTEINUR", "UROBILINOGEN", "NITRITE", "LEUKOCYTESUR" in the last 72 hours.  Invalid input(s): "APPERANCEUR"    Imaging: PERIPHERAL VASCULAR CATHETERIZATION  Result Date: 12/15/2021 See surgical note for result.    Medications:    sodium chloride Stopped (12/03/21 1136)   promethazine (PHENERGAN) injection (IM or IVPB) 12.5 mg (12/05/21 1301)    (feeding supplement) PROSource Plus  30 mL Oral BID BM   amiodarone  200 mg Oral Daily   Chlorhexidine Gluconate Cloth  6 each Topical Q0600   darbepoetin (ARANESP) injection - NON-DIALYSIS  100 mcg Subcutaneous Q Fri-1800   docusate sodium  100 mg Oral BID   feeding supplement (NEPRO CARB STEADY)  237 mL Oral BID BM   heparin injection (subcutaneous)  5,000 Units Subcutaneous Q8H   lactulose  20 g Oral BID   lidocaine  2 patch Transdermal Q24H   multivitamin  1 tablet Oral QHS   pantoprazole  40 mg Oral BID   rifaximin  200 mg Oral BID   sodium chloride flush  10-40 mL Intracatheter Q12H  Assessment/ Plan:     Principal Problem:   Acute renal failure superimposed on stage 4 chronic kidney disease (HCC) Active Problems:   Essential hypertension, benign   Hyperlipidemia   OSA on CPAP   Morbid obesity (HCC)   Coronary artery disease involving native coronary artery of native heart with unstable angina pectoris (HCC)   Paroxysmal atrial fibrillation (HCC)   Chronic obstructive pulmonary disease, unspecified COPD type (HCC)   Chronic diastolic CHF (congestive heart failure) (HCC)   Hypokalemia   Chronic kidney disease (CKD), stage IV (severe) (HCC)   Hyponatremia   Anemia of chronic kidney failure, stage 4 (severe) (HCC)   Edema and ascites    Acute metabolic encephalopathy   Acute hepatic encephalopathy (HCC)   Cirrhosis (HCC)   Thrombocytopenia ([place])  [name] is a 81 y.o.  female with past medical history including diastolic  CHF, anemia, CAD, paroxysmal atrial fibrillation without anticoagulation.  GI bleed.  Chronic kidney disease stage IV.  Patient reports to ED with complaints of lower extremity edema.  Patient has been admitted for Acute CHF (congestive heart failure) ([place]) [I50.9] Acute decompensated heart failure ([place]) [I50.9]   #1: Acute kidney injury on CKD stage IV: Patient was started on dialysis secondary to fluid overload and mental status changes.    Received shortened dialysis treatment yesterday to maintain outpatient schedule.  UF 1 L achieved.  We will place order for temp cath to be removed by nursing.  Appreciate vascular surgery placing PermCath, patient may receive prescribed Tylenol for pain management.  Next treatment scheduled for Saturday.  Currently monitoring discharge plan to include rehab.  Renal navigator currently seeking outpatient treatment placement.   #2: Anemia with chronic kidney disease: Hemoglobin 8.1. Continue weekly Aranesp.   #3: Congestive heart failure: Fluid management with dialysis.   LOS: [facility] 8/18/20231:27 PM

## 2021-12-16 NOTE — TOC Progression Note (Signed)
Transition of Care [facility]) - Progression Note    Patient Details  Name: April [name] MRN: [id number] Date of Birth: 03/08/1941  Transition of Care [facility]) CM/SW [place], LCSW Phone Number: 12/16/2021, 2:51 PM  Clinical Narrative:  Will plan for discharge to SNF side at [facility] on Monday, if stable. HD coordinator notified patient.   Expected Discharge Plan: [place] Barriers to Discharge: Continued Medical Work up  Expected Discharge Plan and Services Expected Discharge Plan: [place] Choice: Resumption of Svcs/PTA Provider Living arrangements for the past 2 months: [place] Arranged: RN, PT, OT [facility] Agency: [place] ([facility]) Date HH Agency Contacted: 11/28/21   Representative spoke with at [place]: Floydene Flock   Social Determinants of Health (SDOH) Interventions    Readmission Risk Interventions    11/28/2021   11:17 AM 10/19/2021   12:27 PM  Readmission Risk Prevention Plan  Transportation Screening Complete Complete  PCP or Specialist Appt within 3-5 Days Complete Complete  HRI or [place] Complete Complete  Social Work Consult for [place] Planning/Counseling Complete Complete  Palliative Care Screening Not Applicable Complete  Medication Review Press photographer) Complete Complete

## 2021-12-16 NOTE — NC FL2 (Signed)
[place] LEVEL OF CARE SCREENING TOOL     IDENTIFICATION  Patient Name: April [name] Birthdate: [birth year] Sex: female Admission Date (Current Location): 11/23/2021  [facility] [place] and [place] Number:  Engineering geologist and Address:  [facility], [address] [place], [place] [address]      Provider Number: [id number]  Attending Physician Name and Address:  Nolberto Hanlon, MD  Relative Name and Phone Number:       Current Level of Care: Hospital Recommended Level of Care: [place] Prior Approval Number:    Date Approved/Denied:   PASRR Number: [id number]  Discharge Plan: SNF    Current Diagnoses: Patient Active Problem List   Diagnosis Date Noted   Thrombocytopenia ([place]) 82/50/5397   Acute metabolic encephalopathy 67/34/1937   Acute hepatic encephalopathy ([place]) 12/09/2021   Cirrhosis ([place]) 12/09/2021   Edema and ascites  12/08/2021   Hyponatremia 12/07/2021   Anemia of chronic kidney failure, stage 4 (severe) ([place]) 12/07/2021   Chronic kidney disease (CKD), stage IV (severe) ([place]) 11/29/2021   Weakness 10/29/2021   Acute respiratory failure with hypoxia ([place]) 10/18/2021   History of recurrent GI bleed on apixaban 10/17/2021   History of adverse reaction to anticoagulant medication 10/17/2021   Electrolyte abnormality 10/17/2021   Acute on chronic congestive heart failure (HCC)    Hypokalemia 08/11/2021   Chronic diastolic CHF (congestive heart failure) ([place]) 08/08/2021   Chronic obstructive pulmonary disease, unspecified COPD type ([place]) 08/01/2021   Iron deficiency anemia    Gastric erythema    Symptomatic anemia    GI bleeding 07/07/2021   Thrombophilia ([place]) 04/10/2021   Aortic atherosclerosis ([place]) 01/03/2021   Cellulitis of leg, left 10/26/2020   Breast tenderness 07/14/2020   Abnormal liver function tests 03/11/2020   Breast pain 12/15/2018   Lymphedema 07/09/2018   Acute renal failure  superimposed on stage 4 chronic kidney disease ([place]) 06/03/2018   Lower extremity edema 01/19/2018   Arthropathy of lumbar facet joint 08/08/2017   Degeneration of lumbar intervertebral disc 08/08/2017   Osteoarthritis of knee 08/08/2017   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   Paroxysmal atrial fibrillation ([place]) 03/27/2016   CAD S/P PCI pRCA Promus DES 3.5 x 16 (4.1 mm), ostRPDA Promus DES 2.5 x 12 (2.7 mm) 03/25/2016   Right knee pain 11/13/2015   Coronary artery disease involving native coronary artery of native heart with unstable angina pectoris ([place]) 06/01/2015   Diabetes mellitus ([place]) 05/16/2015   Gastritis 04/01/2015   Chest pain 04/01/2015   Hiatal hernia 08/25/2014   DOE (dyspnea on exertion) 07/15/2014   Congestion of throat 07/06/2014   Health care maintenance 07/06/2014   Fibrocystic breast disease 07/06/2014   Morbid obesity ([place]) 04/19/2014   Stress 02/16/2013   Rib pain on right side 02/16/2013   Acute on Chronic dyspnea 10/13/2012   GERD (gastroesophageal reflux disease) 10/13/2012   Essential hypertension, benign 08/18/2012   Hyperlipidemia 08/18/2012   History of colonic polyps 08/18/2012   OSA on CPAP 08/18/2012   Osteoporosis 08/18/2012    Orientation RESPIRATION BLADDER Height & Weight     Self, Time, Situation, Place  Other (Comment), Normal (CPAP QHS) Incontinent Weight: 184 lb 4.8 oz (83.6 kg) Height:  5' (152.4 cm)  BEHAVIORAL SYMPTOMS/MOOD NEUROLOGICAL BOWEL NUTRITION STATUS   (None)  (None) Continent Diet (2 gram sodium. Fluid restriction 1200 mL.)  AMBULATORY STATUS COMMUNICATION OF NEEDS Skin   Limited Assist Verbally Bruising, Other (Comment) (Erythema/redness.)  Personal Care Assistance Level of Assistance  Bathing, Feeding, Dressing Bathing Assistance: Limited assistance Feeding assistance: Limited assistance Dressing Assistance: Limited assistance     Functional Limitations Info  Sight, Hearing, Speech Sight Info:  Adequate Hearing Info: Adequate Speech Info: Adequate    SPECIAL CARE FACTORS FREQUENCY  PT (By licensed PT), OT (By licensed OT)     PT Frequency: 5 x week OT Frequency: 5 x week            Contractures Contractures Info: Not present    Additional Factors Info  Code Status, Allergies Code Status Info: Full code Allergies Info: Penicillins, Sulfa Antibiotics           Current Medications (12/16/2021):  This is the current hospital active medication list Current Facility-Administered Medications  Medication Dose Route Frequency Provider Last Rate Last Admin   (feeding supplement) PROSource Plus liquid 30 mL  30 mL Oral BID BM Amery, Sahar, MD       0.9 %  sodium chloride infusion   Intravenous PRN Algernon Huxley, MD   Stopped at 12/03/21 1136   acetaminophen (TYLENOL) tablet 500 mg  500 mg Oral Q6H PRN Algernon Huxley, MD   500 mg at 12/16/21 0530   amiodarone (PACERONE) tablet 200 mg  200 mg Oral Daily Algernon Huxley, MD   200 mg at 12/15/21 8099   Chlorhexidine Gluconate Cloth 2 % PADS 6 each  6 each Topical Q0600 Colon Flattery, NP   6 each at 12/16/21 0440   Darbepoetin Alfa (ARANESP) injection 100 mcg  100 mcg Subcutaneous Q Fri-1800 Algernon Huxley, MD   100 mcg at 12/09/21 1707   diclofenac Sodium (VOLTAREN) 1 % topical gel 2 g  2 g Topical TID PRN Algernon Huxley, MD   2 g at 12/12/21 1353   docusate sodium (COLACE) capsule 100 mg  100 mg Oral BID Algernon Huxley, MD   100 mg at 12/15/21 2300   feeding supplement (NEPRO CARB STEADY) liquid 237 mL  237 mL Oral BID BM Nolberto Hanlon, MD   237 mL at 12/15/21 1742   heparin injection 5,000 Units  5,000 Units Subcutaneous Q8H Mickeal Skinner A, RPH   5,000 Units at 12/16/21 0515   HYDROmorphone (DILAUDID) injection 1 mg  1 mg Intravenous Once PRN Kris Hartmann, NP       lactulose (CHRONULAC) 10 GM/15ML solution 20 g  20 g Oral BID Edwin Dada, MD   20 g at 12/15/21 0956   lidocaine (LIDODERM) 5 % 2 patch  2 patch Transdermal  Q24H Algernon Huxley, MD   2 patch at 12/11/21 1747   multivitamin (RENA-VIT) tablet 1 tablet  1 tablet Oral QHS Nolberto Hanlon, MD   1 tablet at 12/15/21 2300   nitroGLYCERIN (NITROSTAT) SL tablet 0.4 mg  0.4 mg Sublingual Q5 min PRN Algernon Huxley, MD       ondansetron [facility]) injection 4 mg  4 mg Intravenous Q6H PRN Algernon Huxley, MD   4 mg at 12/11/21 1144   ondansetron (ZOFRAN) injection 4 mg  4 mg Intravenous Q6H PRN Kris Hartmann, NP       pantoprazole (PROTONIX) EC tablet 40 mg  40 mg Oral BID Algernon Huxley, MD   40 mg at 12/15/21 2300   promethazine (PHENERGAN) 12.5 mg in sodium chloride 0.9 % 50 mL IVPB  12.5 mg Intravenous Q6H PRN Algernon Huxley, MD 200 mL/hr at 12/05/21 1301 12.5  mg at 12/05/21 1301   rifaximin (XIFAXAN) tablet 200 mg  200 mg Oral BID Edwin Dada, MD   200 mg at 12/15/21 2300   sodium chloride flush (NS) 0.9 % injection 10-40 mL  10-40 mL Intracatheter Q12H Danford, Suann Larry, MD   20 mL at 12/15/21 2300   sodium chloride flush (NS) 0.9 % injection 10-40 mL  10-40 mL Intracatheter PRN Danford, Suann Larry, MD         Discharge Medications: Please see discharge summary for a list of discharge medications.  Relevant Imaging Results:  Relevant Lab Results:   Additional Information SS#: [id number]  Candie Chroman, LCSW

## 2021-12-17 ENCOUNTER — Inpatient Hospital Stay: Payer: Medicare Other

## 2021-12-17 DIAGNOSIS — K7682 Hepatic encephalopathy: Secondary | ICD-10-CM | POA: Diagnosis not present

## 2021-12-17 DIAGNOSIS — G9341 Metabolic encephalopathy: Secondary | ICD-10-CM | POA: Diagnosis not present

## 2021-12-17 DIAGNOSIS — N184 Chronic kidney disease, stage 4 (severe): Secondary | ICD-10-CM | POA: Diagnosis not present

## 2021-12-17 DIAGNOSIS — N171 Acute kidney failure with acute cortical necrosis: Secondary | ICD-10-CM | POA: Diagnosis not present

## 2021-12-17 LAB — CBC
HCT: 20.9 % — ABNORMAL LOW (ref 36.0–46.0)
HCT: 20.3 % — ABNORMAL LOW (ref 36.0–46.0)
Hemoglobin: 7.1 g/dL — ABNORMAL LOW (ref 12.0–15.0)
Hemoglobin: 6.8 g/dL — ABNORMAL LOW (ref 12.0–15.0)
MCH: 32.6 pg (ref 26.0–34.0)
MCH: 31.6 pg (ref 26.0–34.0)
MCHC: 34 g/dL (ref 30.0–36.0)
MCHC: 33.5 g/dL (ref 30.0–36.0)
MCV: 95.9 fL (ref 80.0–100.0)
MCV: 94.4 fL (ref 80.0–100.0)
Platelets: 96 10*3/uL — ABNORMAL LOW (ref 150–400)
Platelets: 76 10*3/uL — ABNORMAL LOW (ref 150–400)
RBC: 2.18 MIL/uL — ABNORMAL LOW (ref 3.87–5.11)
RBC: 2.15 MIL/uL — ABNORMAL LOW (ref 3.87–5.11)
RDW: 22.6 % — ABNORMAL HIGH (ref 11.5–15.5)
RDW: 22.1 % — ABNORMAL HIGH (ref 11.5–15.5)
WBC: 10.3 10*3/uL (ref 4.0–10.5)
WBC: 11.1 10*3/uL — ABNORMAL HIGH (ref 4.0–10.5)
nRBC: 0 % (ref 0.0–0.2)
nRBC: 0 % (ref 0.0–0.2)

## 2021-12-17 LAB — HEMOGLOBIN AND HEMATOCRIT, BLOOD
HCT: 24.8 % — ABNORMAL LOW (ref 36.0–46.0)
Hemoglobin: 8.5 g/dL — ABNORMAL LOW (ref 12.0–15.0)

## 2021-12-17 LAB — RENAL FUNCTION PANEL
Albumin: 2.1 g/dL — ABNORMAL LOW (ref 3.5–5.0)
Anion gap: 7 (ref 5–15)
BUN: 27 mg/dL — ABNORMAL HIGH (ref 8–23)
CO2: 28 mmol/L (ref 22–32)
Calcium: 7.5 mg/dL — ABNORMAL LOW (ref 8.9–10.3)
Chloride: 98 mmol/L (ref 98–111)
Creatinine, Ser: 1.84 mg/dL — ABNORMAL HIGH (ref 0.44–1.00)
GFR, Estimated: 27 mL/min — ABNORMAL LOW (ref >60)
Glucose, Bld: 97 mg/dL (ref 70–99)
Phosphorus: 2.1 mg/dL — ABNORMAL LOW (ref 2.5–4.6)
Potassium: 3.3 mmol/L — ABNORMAL LOW (ref 3.5–5.1)
Sodium: 133 mmol/L — ABNORMAL LOW (ref 135–145)

## 2021-12-17 LAB — MAGNESIUM: Magnesium: 1.9 mg/dL (ref 1.7–2.4)

## 2021-12-17 LAB — PREPARE RBC (CROSSMATCH)

## 2021-12-17 MED ORDER — ACETAMINOPHEN 325 MG PO TABS
650.0000 mg | ORAL_TABLET | Freq: Once | ORAL | Status: AC
Start: 2021-12-17 — End: 2021-12-17
  Administered 2021-12-17: 650 mg via ORAL
  Filled 2021-12-17: qty 2

## 2021-12-17 MED ORDER — MIDODRINE HCL 5 MG PO TABS
5.0000 mg | ORAL_TABLET | Freq: Three times a day (TID) | ORAL | Status: AC
  Administered 2021-12-17 – 2021-12-18 (×3): 5 mg via ORAL
  Filled 2021-12-17 (×3): qty 1

## 2021-12-17 MED ORDER — TORSEMIDE 20 MG PO TABS: 40.0000 mg | ORAL_TABLET | ORAL | Status: DC

## 2021-12-17 MED ORDER — SODIUM CHLORIDE 0.9% IV SOLUTION
Freq: Once | INTRAVENOUS | Status: AC
Start: 2021-12-17 — End: 2021-12-17

## 2021-12-17 MED ORDER — TORSEMIDE 20 MG PO TABS
40.0000 mg | ORAL_TABLET | ORAL | Status: DC
  Administered 2021-12-19: 40 mg via ORAL
  Filled 2021-12-17: qty 2

## 2021-12-17 MED ORDER — MIDODRINE HCL 5 MG PO TABS: 5.0000 mg | ORAL_TABLET | Freq: Three times a day (TID) | ORAL | Status: DC

## 2021-12-17 MED ORDER — ALBUMIN HUMAN 25 % IV SOLN
12.5000 g | Freq: Once | INTRAVENOUS | Status: AC
  Administered 2021-12-17: 12.5 g via INTRAVENOUS
  Filled 2021-12-17: qty 50

## 2021-12-17 MED ORDER — HEPARIN SODIUM (PORCINE) 1000 UNIT/ML IJ SOLN
INTRAMUSCULAR | Status: AC
  Filled 2021-12-17: qty 10

## 2021-12-17 NOTE — Procedures (Signed)
HD Note  Patient done at bedside.  This is the initial dialysis using the HD catheter in her upper right chest.  Labs were sent as ordered. The HD catheter dressing was changed by this Probation officer.  The original dressing was gauze, clean dry and intact.  The bio disk was in place and there was no drainage. The new dressing is transparent, bio disk in place, clean dry.  Arterial and venous pressures were frequently out of range despite the reversing of the lines and several saline flushes.  The patient was repositioned and left side lying appears to be the best position. The BFR had to be dropped to 300 for the treatment to proceed.   The red lumen has had difficulty with pulling and pushing with this treatment  The patient received 1 unit of blood during treatment without issue.  See flowsheet.  O2 sats were down as low as 92, O2 @ 2l/m was applied with success in supporting sats WDL  Due to the nature of bedside dialysis and patient needs, data may be entered at a later time than it is gathered.  The stated time is the correct time the data was gathered.

## 2021-12-17 NOTE — Progress Notes (Signed)
PROGRESS NOTE    ANGLE DIRUSSO  ZOX:[id number] DOB: March 10, 1941 DOA: 11/23/2021 PCP: Einar Pheasant, MD    Brief Narrative:  81 yo F with dCHF, AF on amiodarone no AC, CKD IV baseline 2-2.4 who presented for abdominal and leg swelling and weight gain.    Patient was initially thought to have CHF or nephrogenic edema, and so Nephrology were consulted and she was given escalating doses of Lasix.  Ultimately she developed hepatic encephalopathy, and it became clear her weight gain was ascites and hepatic swelling was a major contributor.    In the meantime, she failed to respond to diuretics, and her renal function worsened and she was started on dialysis  8/17 got her permacath today 8/18 c/o permacath site pain. Would like something for pain at night to sleep  8/19Hg 7.1 . Discussed with pt about transfusion risk/adverse effects, agreeable to get transfused. She reports having transfusion in the past . Temp. Cath dc'd yesterday was bleeding from site last night  Consultants:  Nephrology, vascular  Procedures: Dialysis  Antimicrobials:      Subjective: Feels weak today. No sob or cp   Objective: Vitals:   12/16/21 2338 12/17/21 0331 12/17/21 0500 12/17/21 0806  BP: (!) 130/52 (!) 111/48  (!) 108/57  Pulse: 74 76  75  Resp: '18 18  18  '$ Temp: 98.6 F (37 C) 98.2 F (36.8 C)  99.3 F (37.4 C)  TempSrc:    Oral  SpO2: 96% 96%  96%  Weight:   89 kg   Height:        Intake/Output Summary (Last 24 hours) at 12/17/2021 0858 Last data filed at 12/17/2021 0553 Gross per 24 hour  Intake 973.85 ml  Output --  Net 973.85 ml   Filed Weights   12/15/21 1536 12/16/21 0355 12/17/21 0500  Weight: 83.1 kg 83.6 kg 89 kg    Examination: Calm, NAD Cta no w/r Reg s1/s2 no gallop Soft  nt, +bs, distended +LE edema. Permacath site no bleed. Aaoxox3  Mood and affect appropriate in current setting     Data Reviewed: I have personally reviewed following labs and imaging  studies  CBC: Recent Labs  Lab 12/11/21 0434 12/12/21 0540 12/13/21 0440 12/14/21 0517 12/16/21 2141 12/17/21 0700  WBC 12.7* 10.6* 11.8* 11.7*  --  10.3  HGB 6.8* 7.8* 8.7* 8.1* 8.1* 7.1*  HCT 20.0* 23.9* 25.9* 24.0* 25.2* 20.9*  MCV 90.1 89.8 90.2 91.6  --  95.9  PLT 144* 133* 109* 105*  --  96*   Basic Metabolic Panel: Recent Labs  Lab 12/11/21 0434 12/12/21 0540 12/13/21 0440 12/14/21 0517 12/15/21 0437 12/16/21 0421 12/17/21 0700  NA 134* 132* 133* 133*  --   --   --   K 3.4* 3.2* 3.2* 3.2*  --   --   --   CL 100 99 100 101  --   --   --   CO2 '28 25 26 25  '$ --   --   --   GLUCOSE 109* 100* 99 100*  --   --   --   BUN 32* 43* 28* 42*  --   --   --   CREATININE 2.57* 2.92* 1.94* 2.37*  --   --   --   CALCIUM 8.0* 8.3* 8.2* 8.2*  --   --   --   MG 1.9 2.0 1.7 2.0 1.7 1.9 1.9  PHOS  --  3.6 2.8 3.1  --   --   --  GFR: Estimated Creatinine Clearance: 17.9 mL/min (A) (by C-G formula based on SCr of 2.37 mg/dL (H)). Liver Function Tests: Recent Labs  Lab 12/11/21 0434 12/12/21 0540 12/13/21 0440 12/14/21 0517  AST 93*  --   --   --   ALT 36  --   --   --   ALKPHOS 80  --   --   --   BILITOT 1.4*  --   --   --   PROT 5.4*  --   --   --   ALBUMIN 2.3* 2.2* 2.2* 2.1*   No results for input(s): "LIPASE", "AMYLASE" in the last 168 hours. Recent Labs  Lab 12/14/21 1221  AMMONIA 23   Coagulation Profile: Recent Labs  Lab 12/11/21 0434  INR 1.6*   Cardiac Enzymes: No results for input(s): "CKTOTAL", "CKMB", "CKMBINDEX", "TROPONINI" in the last 168 hours. BNP (last 3 results) No results for input(s): "PROBNP" in the last 8760 hours. HbA1C: No results for input(s): "HGBA1C" in the last 72 hours. CBG: No results for input(s): "GLUCAP" in the last 168 hours.  Lipid Profile: No results for input(s): "CHOL", "HDL", "LDLCALC", "TRIG", "CHOLHDL", "LDLDIRECT" in the last 72 hours. Thyroid Function Tests: No results for input(s): "TSH", "T4TOTAL", "FREET4",  "T3FREE", "THYROIDAB" in the last 72 hours. Anemia Panel: No results for input(s): "VITAMINB12", "FOLATE", "FERRITIN", "TIBC", "IRON", "RETICCTPCT" in the last 72 hours. Sepsis Labs: No results for input(s): "PROCALCITON", "LATICACIDVEN" in the last 168 hours.   Recent Results (from the past 240 hour(s))  Culture, blood (Routine X 2) w Reflex to ID Panel     Status: None   Collection Time: 12/09/21  8:56 AM   Specimen: BLOOD  Result Value Ref Range Status   Specimen Description BLOOD LEFT HAND  Final   Special Requests   Final    BOTTLES DRAWN AEROBIC AND ANAEROBIC Blood Culture adequate volume   Culture   Final    NO GROWTH 5 DAYS Performed at [facility], [address] [place], [place] [address]    Report Status 12/14/2021 FINAL  Final  Culture, blood (Routine X 2) w Reflex to ID Panel     Status: None   Collection Time: 12/09/21  9:13 AM   Specimen: BLOOD  Result Value Ref Range Status   Specimen Description BLOOD LEFT ANTECUBITAL  Final   Special Requests   Final    BOTTLES DRAWN AEROBIC AND ANAEROBIC Blood Culture adequate volume   Culture   Final    NO GROWTH 5 DAYS Performed at [facility], [address], [place], [place] [address]    Report Status 12/14/2021 FINAL  Final         Radiology Studies: PERIPHERAL VASCULAR CATHETERIZATION  Result Date: 12/15/2021 See surgical note for result.       Scheduled Meds:  (feeding supplement) PROSource Plus  30 mL Oral BID BM   amiodarone  200 mg Oral Daily   Chlorhexidine Gluconate Cloth  6 each Topical Q0600   darbepoetin (ARANESP) injection - NON-DIALYSIS  100 mcg Subcutaneous Q Fri-1800   docusate sodium  100 mg Oral BID   feeding supplement (NEPRO CARB STEADY)  237 mL Oral BID BM   heparin injection (subcutaneous)  5,000 Units Subcutaneous Q8H   lactulose  20 g Oral BID   lidocaine  2 patch Transdermal Q24H   multivitamin  1 tablet Oral QHS   pantoprazole  40 mg Oral BID   rifaximin   200 mg Oral BID  sodium chloride flush  10-40 mL Intracatheter Q12H   Continuous Infusions:  sodium chloride Stopped (12/03/21 1136)   promethazine (PHENERGAN) injection (IM or IVPB) 200 mL/hr at 12/17/21 0553    Assessment & Plan:   Principal Problem:   Acute renal failure superimposed on stage 4 chronic kidney disease (HCC) Active Problems:   Anemia of chronic kidney failure, stage 4 (severe) (HCC)   Edema and ascites    Acute metabolic encephalopathy   Acute hepatic encephalopathy (HCC)   Cirrhosis (HCC)   Essential hypertension, benign   Hyperlipidemia   OSA on CPAP   Morbid obesity ([place])   Coronary artery disease involving native coronary artery of native heart with unstable angina pectoris (HCC)   Paroxysmal atrial fibrillation (HCC)   Chronic obstructive pulmonary disease, unspecified COPD type (HCC)   Chronic diastolic CHF (congestive heart failure) (HCC)   Hypokalemia   Chronic kidney disease (CKD), stage IV (severe) (HCC)   Hyponatremia   Thrombocytopenia (HCC)   Acute renal failure superimposed on stage 4 chronic kidney disease ([place]) S/p tunneled catheter 8/10  HD #1 8/11 HD #2 8/12 Minimal UOP again 200cc last 24  HD #3 8/14 planned Patient was started on dialysis secondary to fluid overload and mental status changes. Mentation has returned to baseline  8/18 status post permacath on 8/17 by Dr. Lucky Cowboy  Received short dialysis treatment yesterday to maintain outpatient schedule.  navigator seeking outpatient treatment and placement 8/19 HD today S/p temp cath removed on 8/18    Anemia of chronic kidney failure, stage 4 (severe) (HCC) Patient's Hgb has been trending down steadily over the last few months.  Certainly this is multifactorial with renal failure driving it, and chronic GI blood loss contribtuing.  Iron studies in June showed ferritin 24.  Got IV iron this admission.  Transfused 1 unit 8/13 for Hgb 6.8 g/dL.  Appropriate post-transfusion bump.  -  Hold Eliquis 8/19 hemoglobin 7 .1 We will transfuse 1 unit during dialysis, nephrology aware          Edema and ascites  Cardiology consulted for ?right heart cath needed.  They repeated echo which ruled out a significant component of right heart failure/pHTN.  -Her swelling is multifactorial from cirrhosis, third spacing/hypoalbuminemia, worsening renal failure, venous insufficiency. Acute congestive heart failure ruled out.  -US abdomen 8/14 showed mild ascites only, not enough to paracentesis at this point.  - If ascites worsened, would obtain paracentesis with albumin  /19 fluid management with dialysis  Check limited ultrasound to see if has enough ascites for paracentesis as her abdomen appears to be more distended       Acute hepatic encephalopathy [facility]) Patient with acute encephalopathy 8/11, confused, somnolent.  Ammonia checked and it was up to 159.  Started on lactulose and now having 2-3 BMs per day, mentation improved. 8/16 MS at baseline, improved Ammonium 23 8/19 continue lactulose and rifaximin          Acute metabolic encephalopathy Due to hepatic encephalopathy Appears to have improved   Cirrhosis ([place]) The patient has had radiographic evidence of cirrhosis for a long time, but appeared fairly well compensated.     However, the current illness appears to have decompensated it, and unmasked the extent to which cirrhosis was driving her edema.  This concurrent hepatic and renal failure portends a worse prognosis.   INR 1.6, MELD relatively low, but prognosis poor in setting of concurrent renal failure.    Thrombocytopenia ([place]) Platelets have been trending down.  This is due to cirrhosis.  Portends a worse prognosis.   Hyponatremia - Defer HD to Nephrology   Chronic kidney disease (CKD), stage IV (severe) ([place]) - Consult Nephrology   Hypokalemia Resolved    Chronic diastolic CHF (congestive heart failure) ([place]) See above, cardiology feel that  acute CHF is ruled out. - Hold metolazone and torsemide Volume mx per HD   Chronic obstructive pulmonary disease, unspecified COPD type ([place]) No active disease   Paroxysmal atrial fibrillation ([place]) Not on anticoagulation - Continue amiodarone   Coronary artery disease involving native coronary artery of native heart with unstable angina pectoris ([place]) No aspirin due to GI bleed.  No beta-blocker, ARB due to hypotension.  No statin due to cirrhosis.     Morbid obesity (HCC) BMI 36 with history hypertension, atherosclerosis   OSA on CPAP - CPAP at night   Hyperlipidemia Not on statin   Essential hypertension, benign - Hold torsemide     DVT prophylaxis: Heparin Code Status: Full Family Communication: None at bedside Disposition Plan: SNF Status is: Inpatient Remains inpatient appropriate because: IV treatment.  Getting hemodialysis. S/p permacath. Needs clearance from nephrology and outpatient setup for HD getting transfusion today        LOS: 23 days   Time spent: 35 min    Nolberto Hanlon, MD [facility] Pager [phone]  If 7PM-7AM, please contact night-coverage 12/17/2021, 8:58 AM

## 2021-12-17 NOTE — Progress Notes (Signed)
[facility]  PROGRESS NOTE   Subjective:   Patient seen resting in bed, son at bedside Complains of soreness at PermCath placement site. No other complaints at this time Lower extremity edema remains, but improved  Objective:  Vital signs: Blood pressure (!) 109/48, pulse 75, temperature 98.4 F (36.9 C), temperature source Oral, resp. rate 18, height 5' (1.524 m), weight 89 kg, last menstrual period 05/01/1972, SpO2 98 %.  Intake/Output Summary (Last 24 hours) at 12/17/2021 1320 Last data filed at 12/17/2021 0553 Gross per 24 hour  Intake 733.85 ml  Output --  Net 733.85 ml    Filed Weights   12/15/21 1536 12/16/21 0355 12/17/21 0500  Weight: 83.1 kg 83.6 kg 89 kg     Physical Exam: General:  No acute distress  Head:  Normocephalic, atraumatic. Moist oral mucosal membranes  Eyes:  Anicteric  Lungs:   Clear to auscultation, normal effort  Heart:  S1S2 no rubs  Abdomen:   Soft, nontender, bowel sounds present  Extremities: 1-2+ peripheral edema.  Neurologic:  Awake, alert, following commands  Skin:  No lesions  Access: Right IJ perm cath    Basic Metabolic Panel: Recent Labs  Lab 12/11/21 0434 12/12/21 0540 12/13/21 0440 12/14/21 0517 12/15/21 0437 12/16/21 0421 12/17/21 0700  NA 134* 132* 133* 133*  --   --   --   K 3.4* 3.2* 3.2* 3.2*  --   --   --   CL 100 99 100 101  --   --   --   CO2 '28 25 26 25  '$ --   --   --   GLUCOSE 109* 100* 99 100*  --   --   --   BUN 32* 43* 28* 42*  --   --   --   CREATININE 2.57* 2.92* 1.94* 2.37*  --   --   --   CALCIUM 8.0* 8.3* 8.2* 8.2*  --   --   --   MG 1.9 2.0 1.7 2.0 1.7 1.9 1.9  PHOS  --  3.6 2.8 3.1  --   --   --      CBC: Recent Labs  Lab 12/11/21 0434 12/12/21 0540 12/13/21 0440 12/14/21 0517 12/16/21 2141 12/17/21 0700  WBC 12.7* 10.6* 11.8* 11.7*  --  10.3  HGB 6.8* 7.8* 8.7* 8.1* 8.1* 7.1*  HCT 20.0* 23.9* 25.9* 24.0* 25.2* 20.9*  MCV 90.1 89.8 90.2 91.6  --  95.9  PLT 144* 133* 109*  105*  --  96*      Urinalysis: No results for input(s): "COLORURINE", "LABSPEC", "PHURINE", "GLUCOSEU", "HGBUR", "BILIRUBINUR", "KETONESUR", "PROTEINUR", "UROBILINOGEN", "NITRITE", "LEUKOCYTESUR" in the last 72 hours.  Invalid input(s): "APPERANCEUR"    Imaging: No results found.   Medications:    sodium chloride Stopped (12/03/21 1136)   promethazine (PHENERGAN) injection (IM or IVPB) 200 mL/hr at 12/17/21 0553    (feeding supplement) PROSource Plus  30 mL Oral BID BM   sodium chloride   Intravenous Once   acetaminophen  650 mg Oral Once   amiodarone  200 mg Oral Daily   Chlorhexidine Gluconate Cloth  6 each Topical Q0600   darbepoetin (ARANESP) injection - NON-DIALYSIS  100 mcg Subcutaneous Q Fri-1800   docusate sodium  100 mg Oral BID   feeding supplement (NEPRO CARB STEADY)  237 mL Oral BID BM   lactulose  20 g Oral BID   lidocaine  2 patch Transdermal Q24H   multivitamin  1 tablet Oral  QHS   pantoprazole  40 mg Oral BID   rifaximin  200 mg Oral BID   sodium chloride flush  10-40 mL Intracatheter Q12H    Assessment/ Plan:     Principal Problem:   Acute renal failure superimposed on stage 4 chronic kidney disease (HCC) Active Problems:   Essential hypertension, benign   Hyperlipidemia   OSA on CPAP   Morbid obesity (HCC)   Coronary artery disease 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 BRAILYN KILLION is a 81 y.o.  female with past medical history including diastolic CHF,  anemia, CAD, paroxysmal atrial fibrillation without anticoagulation.  GI bleed.  Chronic kidney disease stage IV.  Patient reports to ED with complaints of lower extremity edema.  Patient has been admitted for Acute CHF (congestive heart failure) ([place]) [I50.9] Acute decompensated heart failure ([place]) [I50.9]   #1: Acute kidney injury on CKD stage IV: Patient was started on dialysis secondary to fluid overload and mental status changes.    Tolerating dialysis fair so far.  1000 cc removed on Thursday.  Next hemodialysis planned for later today.  Currently monitoring discharge plan to include rehab.  Renal navigator currently seeking outpatient treatment placement.   #2: Anemia with chronic kidney disease: Hemoglobin 8.1. Continue weekly Aranesp.  Patient to receive blood transfusion with hemodialysis today   #3: Congestive heart failure: Grade 2 diastolic dysfunction with volume overload-2D echo 12/08/2021 - Fluid management with dialysis.  Added torsemide on nondialysis days.   LOS: [facility] 8/19/20231:20 PM

## 2021-12-18 DIAGNOSIS — K7682 Hepatic encephalopathy: Secondary | ICD-10-CM | POA: Diagnosis not present

## 2021-12-18 DIAGNOSIS — N171 Acute kidney failure with acute cortical necrosis: Secondary | ICD-10-CM | POA: Diagnosis not present

## 2021-12-18 DIAGNOSIS — N184 Chronic kidney disease, stage 4 (severe): Secondary | ICD-10-CM | POA: Diagnosis not present

## 2021-12-18 DIAGNOSIS — G9341 Metabolic encephalopathy: Secondary | ICD-10-CM | POA: Diagnosis not present

## 2021-12-18 LAB — TYPE AND SCREEN
ABO/RH(D): A POS
Antibody Screen: NEGATIVE
Unit division: 0

## 2021-12-18 LAB — CBC
HCT: 23.2 % — ABNORMAL LOW (ref 36.0–46.0)
Hemoglobin: 7.9 g/dL — ABNORMAL LOW (ref 12.0–15.0)
MCH: 31.7 pg (ref 26.0–34.0)
MCHC: 34.1 g/dL (ref 30.0–36.0)
MCV: 93.2 fL (ref 80.0–100.0)
Platelets: 80 10*3/uL — ABNORMAL LOW (ref 150–400)
RBC: 2.49 MIL/uL — ABNORMAL LOW (ref 3.87–5.11)
RDW: 21.7 % — ABNORMAL HIGH (ref 11.5–15.5)
WBC: 10.1 10*3/uL (ref 4.0–10.5)
nRBC: 0 % (ref 0.0–0.2)

## 2021-12-18 LAB — BPAM RBC
Blood Product Expiration Date: 202309132359
ISSUE DATE / TIME: 202308191803
Unit Type and Rh: 6200

## 2021-12-18 LAB — MAGNESIUM: Magnesium: 1.7 mg/dL (ref 1.7–2.4)

## 2021-12-18 MED ORDER — HYDROMORPHONE HCL 1 MG/ML IJ SOLN
0.5000 mg | Freq: Four times a day (QID) | INTRAMUSCULAR | Status: DC | PRN
  Administered 2021-12-18 (×2): 0.5 mg via INTRAVENOUS
  Filled 2021-12-18 (×3): qty 1

## 2021-12-18 NOTE — Progress Notes (Signed)
Jinny Blossom, RN called HD unit to report pt c/o pain from HD catheter site "radiating to her head". Secure chat conversation started to include Gwyneth Revels, NP and Candiss Norse, MD.

## 2021-12-18 NOTE — Progress Notes (Signed)
Mobility Specialist - Progress Note    12/18/21 1444  Mobility  Activity Ambulated with assistance in hallway  Level of Assistance Contact guard assist, steadying assist  Assistive Device Front wheel walker  Distance Ambulated (ft) 180 ft  Activity Response Tolerated well  $Mobility charge 1 Mobility   Pt sitting in chair upon entry, utilizing RA. Pt voiced concerns of pain in the area of the hemodialysis catheter (neck), nurse notified. Pt ambulated one lap around NS using RW CGA. Pt left sitting in chair with needs in reach.   April [name] Mobility Specialist 12/18/21 2:48 PM

## 2021-12-18 NOTE — Progress Notes (Signed)
PROGRESS NOTE    April [name]  WGN:[id number] DOB: Apr 01, 1941 DOA: 11/23/2021 PCP: Einar Pheasant, MD    Brief Narrative:  81 yo F with dCHF, AF on amiodarone no AC, CKD IV baseline 2-2.4 who presented for abdominal and leg swelling and weight gain.    Patient was initially thought to have CHF or nephrogenic edema, and so Nephrology were consulted and she was given escalating doses of Lasix.  Ultimately she developed hepatic encephalopathy, and it became clear her weight gain was ascites and hepatic swelling was a major contributor.    In the meantime, she failed to respond to diuretics, and her renal function worsened and she was started on dialysis  8/17 got her permacath today 8/18 c/o permacath site pain. Would like something for pain at night to sleep  8/19Hg 7.1 . Discussed with pt about transfusion risk/adverse effects, agreeable to get transfused. She reports having transfusion in the past . Temp. Cath dc'd yesterday was bleeding from site last night 8/20 s/p I unit prbc in HD yesterday Feels better. +BM  Consultants:  Nephrology, vascular  Procedures: Dialysis  Antimicrobials:      Subjective: No sob, or cp   Objective: Vitals:   12/18/21 0115 12/18/21 0405 12/18/21 0431 12/18/21 0825  BP: (!) 107/56 (!) 99/49  (!) 113/45  Pulse: 73 70  74  Resp: '17 17  16  '$ Temp: 99 F (37.2 C) 99.8 F (37.7 C)  99 F (37.2 C)  TempSrc:    Oral  SpO2: 97% 94%  96%  Weight:   90.6 kg   Height:        Intake/Output Summary (Last 24 hours) at 12/18/2021 0851 Last data filed at 12/18/2021 0004 Gross per 24 hour  Intake 4192.67 ml  Output 1500 ml  Net 2692.67 ml   Filed Weights   12/17/21 0500 12/17/21 1620 12/18/21 0431  Weight: 89 kg 87.3 kg 90.6 kg    Examination: Calm, NAD Cta no w/r Reg s1/s2 no gallop Soft benign +bs + LE edema but improving Aaoxox3  Mood and affect appropriate in current setting     Data Reviewed: I have personally reviewed  following labs and imaging studies  CBC: Recent Labs  Lab 12/13/21 0440 12/14/21 0517 12/16/21 2141 12/17/21 0700 12/17/21 1929 12/17/21 2058 12/18/21 0610  WBC 11.8* 11.7*  --  10.3 11.1*  --  10.1  HGB 8.7* 8.1* 8.1* 7.1* 6.8* 8.5* 7.9*  HCT 25.9* 24.0* 25.2* 20.9* 20.3* 24.8* 23.2*  MCV 90.2 91.6  --  95.9 94.4  --  93.2  PLT 109* 105*  --  96* 76*  --  80*   Basic Metabolic Panel: Recent Labs  Lab 12/12/21 0540 12/13/21 0440 12/14/21 0517 12/15/21 0437 12/16/21 0421 12/17/21 0700 12/17/21 1929 12/18/21 0610  NA 132* 133* 133*  --   --   --  133*  --   K 3.2* 3.2* 3.2*  --   --   --  3.3*  --   CL 99 100 101  --   --   --  98  --   CO2 '25 26 25  '$ --   --   --  28  --   GLUCOSE 100* 99 100*  --   --   --  97  --   BUN 43* 28* 42*  --   --   --  27*  --   CREATININE 2.92* 1.94* 2.37*  --   --   --  1.84*  --   CALCIUM 8.3* 8.2* 8.2*  --   --   --  7.5*  --   MG 2.0 1.7 2.0 1.7 1.9 1.9  --  1.7  PHOS 3.6 2.8 3.1  --   --   --  2.1*  --    GFR: Estimated Creatinine Clearance: 23.2 mL/min (A) (by C-G formula based on SCr of 1.84 mg/dL (H)). Liver Function Tests: Recent Labs  Lab 12/12/21 0540 12/13/21 0440 12/14/21 0517 12/17/21 1929  ALBUMIN 2.2* 2.2* 2.1* 2.1*   No results for input(s): "LIPASE", "AMYLASE" in the last 168 hours. Recent Labs  Lab 12/14/21 1221  AMMONIA 23   Coagulation Profile: No results for input(s): "INR", "PROTIME" in the last 168 hours.  Cardiac Enzymes: No results for input(s): "CKTOTAL", "CKMB", "CKMBINDEX", "TROPONINI" in the last 168 hours. BNP (last 3 results) No results for input(s): "PROBNP" in the last 8760 hours. HbA1C: No results for input(s): "HGBA1C" in the last 72 hours. CBG: No results for input(s): "GLUCAP" in the last 168 hours.  Lipid Profile: No results for input(s): "CHOL", "HDL", "LDLCALC", "TRIG", "CHOLHDL", "LDLDIRECT" in the last 72 hours. Thyroid Function Tests: No results for input(s): "TSH",  "T4TOTAL", "FREET4", "T3FREE", "THYROIDAB" in the last 72 hours. Anemia Panel: No results for input(s): "VITAMINB12", "FOLATE", "FERRITIN", "TIBC", "IRON", "RETICCTPCT" in the last 72 hours. Sepsis Labs: No results for input(s): "PROCALCITON", "LATICACIDVEN" in the last 168 hours.   Recent Results (from the past 240 hour(s))  Culture, blood (Routine X 2) w Reflex to ID Panel     Status: None   Collection Time: 12/09/21  8:56 AM   Specimen: BLOOD  Result Value Ref Range Status   Specimen Description BLOOD LEFT HAND  Final   Special Requests   Final    BOTTLES DRAWN AEROBIC AND ANAEROBIC Blood Culture adequate volume   Culture   Final    NO GROWTH 5 DAYS Performed at [facility], [address] [place], [place] [address]    Report Status 12/14/2021 FINAL  Final  Culture, blood (Routine X 2) w Reflex to ID Panel     Status: None   Collection Time: 12/09/21  9:13 AM   Specimen: BLOOD  Result Value Ref Range Status   Specimen Description BLOOD LEFT ANTECUBITAL  Final   Special Requests   Final    BOTTLES DRAWN AEROBIC AND ANAEROBIC Blood Culture adequate volume   Culture   Final    NO GROWTH 5 DAYS Performed at [facility], [address] [place], [place] [address]    Report Status 12/14/2021 FINAL  Final         Radiology Studies: US Abdomen Limited  Result Date: 12/17/2021 CLINICAL DATA:  Assess for ascites. EXAM: LIMITED ABDOMEN ULTRASOUND FOR ASCITES TECHNIQUE: Limited ultrasound survey for ascites was performed in all four abdominal quadrants. COMPARISON:  12/12/2021. FINDINGS: Small amount of ascites noted in all 4 quadrants similar to the recent prior ultrasound. IMPRESSION: 1. Small amount of ascites without significant change from the study dated 12/12/2021. Electronically Signed   By: Lajean Manes M.D.   On: 12/17/2021 15:30        Scheduled Meds:  (feeding supplement) PROSource Plus  30 mL Oral BID BM   amiodarone  200 mg Oral Daily    Chlorhexidine Gluconate Cloth  6 each Topical Q0600   darbepoetin (ARANESP) injection - NON-DIALYSIS  100 mcg Subcutaneous Q Fri-1800   docusate sodium  100 mg Oral BID  feeding supplement (NEPRO CARB STEADY)  237 mL Oral BID BM   lactulose  20 g Oral BID   lidocaine  2 patch Transdermal Q24H   midodrine  5 mg Oral TID WC   multivitamin  1 tablet Oral QHS   pantoprazole  40 mg Oral BID   rifaximin  200 mg Oral BID   sodium chloride flush  10-40 mL Intracatheter Q12H   [START ON 12/19/2021] torsemide  40 mg Oral Once per day on Mon Wed Fri   Continuous Infusions:  sodium chloride Stopped (12/03/21 1136)   promethazine (PHENERGAN) injection (IM or IVPB) 200 mL/hr at 12/18/21 0004    Assessment & Plan:   Principal Problem:   Acute renal failure superimposed on stage 4 chronic kidney disease ([place]) Active Problems:   Anemia of chronic kidney failure, stage 4 (severe) (HCC)   Edema and ascites    Acute metabolic encephalopathy   Acute hepatic encephalopathy (HCC)   Cirrhosis (HCC)   Essential hypertension, benign   Hyperlipidemia   OSA on CPAP   Morbid obesity ([place])   Coronary artery disease involving native coronary artery of native heart with unstable angina pectoris (HCC)   Paroxysmal atrial fibrillation (HCC)   Chronic obstructive pulmonary disease, unspecified COPD type (HCC)   Chronic diastolic CHF (congestive heart failure) (HCC)   Hypokalemia   Chronic kidney disease (CKD), stage IV (severe) (HCC)   Hyponatremia   Thrombocytopenia (HCC)   Acute renal failure superimposed on stage 4 chronic kidney disease ([place]) S/p tunneled catheter 8/10  HD #1 8/11 HD #2 8/12 Minimal UOP again 200cc last 24  HD #3 8/14 planned Patient was started on dialysis secondary to fluid overload and mental status changes. Mentation has returned to baseline  8/18 status post permacath on 8/17 by Dr. Lucky Cowboy  Received short dialysis treatment yesterday to maintain outpatient schedule.  navigator  seeking outpatient treatment and placement S/p temp cath removed on 8/18 8/20 next HD tuesday    Anemia of chronic kidney failure, stage 4 (severe) ([place]) Patient's Hgb has been trending down steadily over the last few months.  Certainly this is multifactorial with renal failure driving it, and chronic GI blood loss contribtuing.  Iron studies in June showed ferritin 24.  Got IV iron this admission.  Transfused 1 unit 8/13 for Hgb 6.8 g/dL.  Appropriate post-transfusion bump.  - Hold Eliquis 8/19 hemoglobin 7 .1 8/20 transfuse 1 unit of packed red blood cell on 8/19 during dialysis we will transfuse 1 unit during dialysis, nephrology aware  Continue to monitor         Edema and ascites  Cardiology consulted for ?right heart cath needed.  They repeated echo which ruled out a significant component of right heart failure/pHTN.  -Her swelling is multifactorial from cirrhosis, third spacing/hypoalbuminemia, worsening renal failure, venous insufficiency. Acute congestive heart failure ruled out.  -US abdomen 8/14 showed mild ascites only, not enough to paracentesis at this point.  - If ascites worsened, would obtain paracentesis with albumin  /819 fluid management with dialysis  Check limited ultrasound to see if has enough ascites for paracentesis as her abdomen appears to be more distended 8/20 abd [place] repeat -small ascitis, same as previous US 8/14       Acute hepatic encephalopathy [facility]) Patient with acute encephalopathy 8/11, confused, somnolent.  Ammonia checked and it was up to 159.  Started on lactulose and now having 2-3 BMs per day, mentation improved. 8/16 MS at baseline, improved Ammonium 23  8/20 improved at baseline  Continue lactulose and rifaximin            Acute metabolic encephalopathy Due to hepatic encephalopathy Appears to have improved   Cirrhosis ([place]) The patient has had radiographic evidence of cirrhosis for a long time, but appeared fairly well  compensated.     However, the current illness appears to have decompensated it, and unmasked the extent to which cirrhosis was driving her edema.  This concurrent hepatic and renal failure portends a worse prognosis.   INR 1.6, MELD relatively low, but prognosis poor in setting of concurrent renal failure.    Thrombocytopenia ([place]) Platelets have been trending down.  This is due to cirrhosis.  Portends a worse prognosis.   Hyponatremia - Defer HD to Nephrology   Chronic kidney disease (CKD), stage IV (severe) ([place]) - Consult Nephrology   Hypokalemia Resolved    Chronic diastolic CHF (congestive heart failure) ([place]) See above, cardiology feel that acute CHF is ruled out. - Hold metolazone and torsemide Volume mx per HD   Chronic obstructive pulmonary disease, unspecified COPD type ([place]) No active disease   Paroxysmal atrial fibrillation ([place]) Not on anticoagulation - Continue amiodarone   Coronary artery disease involving native coronary artery of native heart with unstable angina pectoris ([place]) No aspirin due to GI bleed.  No beta-blocker, ARB due to hypotension.  No statin due to cirrhosis.     Morbid obesity (HCC) BMI 36 with history hypertension, atherosclerosis   OSA on CPAP - CPAP at night   Hyperlipidemia Not on statin   Essential hypertension, benign - Hold torsemide     DVT prophylaxis: Heparin Code Status: Full Family Communication: None at bedside Disposition Plan: SNF Status is: Inpatient Remains inpatient appropriate because: Getting HD. Monitor h/h     LOS: 24 days   Time spent: 4 min    Nolberto Hanlon, MD [facility] Pager [phone]  If 7PM-7AM, please contact night-coverage 12/18/2021, 8:51 AM

## 2021-12-18 NOTE — Progress Notes (Signed)
[facility]  PROGRESS NOTE   Subjective:   Patient seen resting in bed States she feels bad today Tolerating meals with nausea and vomiting Denies shortness of breath Continues to complain of weakness Permcath remains sore  Objective:  Vital signs: Blood pressure (!) 118/49, pulse 78, temperature 98.8 F (37.1 C), temperature source Oral, resp. rate 16, height 5' (1.524 m), weight 90.6 kg, last menstrual period 05/01/1972, SpO2 93 %.  Intake/Output Summary (Last 24 hours) at 12/18/2021 1412 Last data filed at 12/18/2021 0004 Gross per 24 hour  Intake 3992.67 ml  Output 1500 ml  Net 2492.67 ml    Filed Weights   12/17/21 0500 12/17/21 1620 12/18/21 0431  Weight: 89 kg 87.3 kg 90.6 kg     Physical Exam: General:  No acute distress  Head:  Normocephalic, atraumatic. Moist oral mucosal membranes  Eyes:  Anicteric  Lungs:   Clear to auscultation, normal effort  Heart:  S1S2 no rubs  Abdomen:   Soft, nontender, bowel sounds present  Extremities: 1-2+ peripheral edema.  Neurologic:  Awake, alert, following commands  Skin:  No lesions  Access: Right IJ perm cath    Basic Metabolic Panel: Recent Labs  Lab 12/12/21 0540 12/13/21 0440 12/14/21 0517 12/15/21 0437 12/16/21 0421 12/17/21 0700 12/17/21 1929 12/18/21 0610  NA 132* 133* 133*  --   --   --  133*  --   K 3.2* 3.2* 3.2*  --   --   --  3.3*  --   CL 99 100 101  --   --   --  98  --   CO2 '25 26 25  '$ --   --   --  28  --   GLUCOSE 100* 99 100*  --   --   --  97  --   BUN 43* 28* 42*  --   --   --  27*  --   CREATININE 2.92* 1.94* 2.37*  --   --   --  1.84*  --   CALCIUM 8.3* 8.2* 8.2*  --   --   --  7.5*  --   MG 2.0 1.7 2.0 1.7 1.9 1.9  --  1.7  PHOS 3.6 2.8 3.1  --   --   --  2.1*  --      CBC: Recent Labs  Lab 12/13/21 0440 12/14/21 0517 12/16/21 2141 12/17/21 0700 12/17/21 1929 12/17/21 2058 12/18/21 0610  WBC 11.8* 11.7*  --  10.3 11.1*  --  10.1  HGB 8.7* 8.1* 8.1* 7.1* 6.8*  8.5* 7.9*  HCT 25.9* 24.0* 25.2* 20.9* 20.3* 24.8* 23.2*  MCV 90.2 91.6  --  95.9 94.4  --  93.2  PLT 109* 105*  --  96* 76*  --  80*      Urinalysis: No results for input(s): "COLORURINE", "LABSPEC", "PHURINE", "GLUCOSEU", "HGBUR", "BILIRUBINUR", "KETONESUR", "PROTEINUR", "UROBILINOGEN", "NITRITE", "LEUKOCYTESUR" in the last 72 hours.  Invalid input(s): "APPERANCEUR"    Imaging: US Abdomen Limited  Result Date: 12/17/2021 CLINICAL DATA:  Assess for ascites. EXAM: LIMITED ABDOMEN ULTRASOUND FOR ASCITES TECHNIQUE: Limited ultrasound survey for ascites was performed in all four abdominal quadrants. COMPARISON:  12/12/2021. FINDINGS: Small amount of ascites noted in all 4 quadrants similar to the recent prior ultrasound. IMPRESSION: 1. Small amount of ascites without significant change from the study dated 12/12/2021. Electronically Signed   By: Lajean Manes M.D.   On: 12/17/2021 15:30     Medications:  sodium chloride Stopped (12/03/21 1136)   promethazine (PHENERGAN) injection (IM or IVPB) 200 mL/hr at 12/18/21 0004    (feeding supplement) PROSource Plus  30 mL Oral BID BM   amiodarone  200 mg Oral Daily   Chlorhexidine Gluconate Cloth  6 each Topical Q0600   darbepoetin (ARANESP) injection - NON-DIALYSIS  100 mcg Subcutaneous Q Fri-1800   docusate sodium  100 mg Oral BID   feeding supplement (NEPRO CARB STEADY)  237 mL Oral BID BM   lactulose  20 g Oral BID   lidocaine  2 patch Transdermal Q24H   multivitamin  1 tablet Oral QHS   pantoprazole  40 mg Oral BID   rifaximin  200 mg Oral BID   sodium chloride flush  10-40 mL Intracatheter Q12H   [START ON 12/19/2021] torsemide  40 mg Oral Once per day on Mon Wed Fri    Assessment/ Plan:     Principal Problem:   Acute renal failure superimposed on stage 4 chronic kidney disease ([place]) Active Problems:   Essential hypertension, benign   Hyperlipidemia   OSA on CPAP   Morbid obesity ([place])   Coronary artery disease involving  native coronary artery of native heart with unstable angina pectoris (HCC)   Paroxysmal atrial fibrillation (HCC)   Chronic obstructive pulmonary disease, unspecified COPD type (HCC)   Chronic diastolic CHF (congestive heart failure) (HCC)   Hypokalemia   Chronic kidney disease (CKD), stage IV (severe) (HCC)   Hyponatremia   Anemia of chronic kidney failure, stage 4 (severe) (HCC)   Edema and ascites    Acute metabolic encephalopathy   Acute hepatic encephalopathy (HCC)   Cirrhosis (HCC)   Thrombocytopenia ([place])  [name] is a 81 y.o.  female with past medical history including diastolic CHF, anemia, CAD, paroxysmal atrial fibrillation without anticoagulation.  GI bleed.  Chronic kidney disease stage IV.  Patient reports to ED with complaints of lower extremity edema.  Patient has been admitted for Acute CHF (congestive heart failure) ([place]) [I50.9] Acute decompensated heart failure ([place]) [I50.9]   #1: Acute kidney injury on CKD stage IV: Patient was started on dialysis secondary to fluid overload and mental status changes.    Received dialysis yesterday, 1.5 L removed.  Next treatment scheduled for Tuesday.  Renal navigator to confirm outpatient placement at [facility] on TTS schedule.   #2: Anemia with chronic kidney disease: Hemoglobin 8.1. Continue weekly Aranesp.  Patient received blood transfusion during dialysis yesterday however hemoglobin 7.9 today.  Defer management to primary team.   #3: Congestive heart failure: Grade 2 diastolic dysfunction with volume overload-2D echo 12/08/2021 -Received torsemide on nondialysis days.   LOS: [facility] 8/20/20232:12 PM

## 2021-12-19 ENCOUNTER — Telehealth: Payer: Self-pay

## 2021-12-19 DIAGNOSIS — D72829 Elevated white blood cell count, unspecified: Secondary | ICD-10-CM | POA: Diagnosis not present

## 2021-12-19 DIAGNOSIS — E7800 Pure hypercholesterolemia, unspecified: Secondary | ICD-10-CM | POA: Diagnosis present

## 2021-12-19 DIAGNOSIS — N186 End stage renal disease: Secondary | ICD-10-CM | POA: Diagnosis not present

## 2021-12-19 DIAGNOSIS — G4730 Sleep apnea, unspecified: Secondary | ICD-10-CM | POA: Diagnosis present

## 2021-12-19 DIAGNOSIS — E669 Obesity, unspecified: Secondary | ICD-10-CM | POA: Diagnosis present

## 2021-12-19 DIAGNOSIS — R079 Chest pain, unspecified: Secondary | ICD-10-CM | POA: Diagnosis not present

## 2021-12-19 DIAGNOSIS — J441 Chronic obstructive pulmonary disease with (acute) exacerbation: Secondary | ICD-10-CM | POA: Diagnosis not present

## 2021-12-19 DIAGNOSIS — I10 Essential (primary) hypertension: Secondary | ICD-10-CM | POA: Diagnosis not present

## 2021-12-19 DIAGNOSIS — G4733 Obstructive sleep apnea (adult) (pediatric): Secondary | ICD-10-CM | POA: Diagnosis not present

## 2021-12-19 DIAGNOSIS — K449 Diaphragmatic hernia without obstruction or gangrene: Secondary | ICD-10-CM | POA: Diagnosis not present

## 2021-12-19 DIAGNOSIS — I480 Paroxysmal atrial fibrillation: Secondary | ICD-10-CM | POA: Diagnosis not present

## 2021-12-19 DIAGNOSIS — I2699 Other pulmonary embolism without acute cor pulmonale: Secondary | ICD-10-CM | POA: Diagnosis present

## 2021-12-19 DIAGNOSIS — E559 Vitamin D deficiency, unspecified: Secondary | ICD-10-CM | POA: Diagnosis not present

## 2021-12-19 DIAGNOSIS — Z803 Family history of malignant neoplasm of breast: Secondary | ICD-10-CM | POA: Diagnosis not present

## 2021-12-19 DIAGNOSIS — D509 Iron deficiency anemia, unspecified: Secondary | ICD-10-CM | POA: Diagnosis not present

## 2021-12-19 DIAGNOSIS — D696 Thrombocytopenia, unspecified: Secondary | ICD-10-CM | POA: Diagnosis not present

## 2021-12-19 DIAGNOSIS — D631 Anemia in chronic kidney disease: Secondary | ICD-10-CM | POA: Diagnosis not present

## 2021-12-19 DIAGNOSIS — I132 Hypertensive heart and chronic kidney disease with heart failure and with stage 5 chronic kidney disease, or end stage renal disease: Secondary | ICD-10-CM | POA: Diagnosis present

## 2021-12-19 DIAGNOSIS — Z66 Do not resuscitate: Secondary | ICD-10-CM | POA: Diagnosis not present

## 2021-12-19 DIAGNOSIS — Z515 Encounter for palliative care: Secondary | ICD-10-CM | POA: Diagnosis not present

## 2021-12-19 DIAGNOSIS — Z8249 Family history of ischemic heart disease and other diseases of the circulatory system: Secondary | ICD-10-CM | POA: Diagnosis not present

## 2021-12-19 DIAGNOSIS — I5030 Unspecified diastolic (congestive) heart failure: Secondary | ICD-10-CM | POA: Diagnosis not present

## 2021-12-19 DIAGNOSIS — J90 Pleural effusion, not elsewhere classified: Secondary | ICD-10-CM | POA: Diagnosis not present

## 2021-12-19 DIAGNOSIS — N171 Acute kidney failure with acute cortical necrosis: Secondary | ICD-10-CM | POA: Diagnosis not present

## 2021-12-19 DIAGNOSIS — Z993 Dependence on wheelchair: Secondary | ICD-10-CM | POA: Diagnosis not present

## 2021-12-19 DIAGNOSIS — I447 Left bundle-branch block, unspecified: Secondary | ICD-10-CM | POA: Diagnosis present

## 2021-12-19 DIAGNOSIS — K219 Gastro-esophageal reflux disease without esophagitis: Secondary | ICD-10-CM | POA: Diagnosis present

## 2021-12-19 DIAGNOSIS — D649 Anemia, unspecified: Secondary | ICD-10-CM | POA: Diagnosis not present

## 2021-12-19 DIAGNOSIS — I2510 Atherosclerotic heart disease of native coronary artery without angina pectoris: Secondary | ICD-10-CM | POA: Diagnosis present

## 2021-12-19 DIAGNOSIS — R0789 Other chest pain: Secondary | ICD-10-CM | POA: Diagnosis not present

## 2021-12-19 DIAGNOSIS — N184 Chronic kidney disease, stage 4 (severe): Secondary | ICD-10-CM | POA: Diagnosis not present

## 2021-12-19 DIAGNOSIS — N179 Acute kidney failure, unspecified: Secondary | ICD-10-CM | POA: Diagnosis not present

## 2021-12-19 DIAGNOSIS — K7460 Unspecified cirrhosis of liver: Secondary | ICD-10-CM | POA: Diagnosis not present

## 2021-12-19 DIAGNOSIS — Z20822 Contact with and (suspected) exposure to covid-19: Secondary | ICD-10-CM | POA: Diagnosis present

## 2021-12-19 DIAGNOSIS — M810 Age-related osteoporosis without current pathological fracture: Secondary | ICD-10-CM | POA: Diagnosis present

## 2021-12-19 DIAGNOSIS — Z86010 Personal history of colonic polyps: Secondary | ICD-10-CM | POA: Diagnosis not present

## 2021-12-19 DIAGNOSIS — I5022 Chronic systolic (congestive) heart failure: Secondary | ICD-10-CM | POA: Diagnosis not present

## 2021-12-19 DIAGNOSIS — I5032 Chronic diastolic (congestive) heart failure: Secondary | ICD-10-CM | POA: Diagnosis not present

## 2021-12-19 DIAGNOSIS — N189 Chronic kidney disease, unspecified: Secondary | ICD-10-CM | POA: Diagnosis not present

## 2021-12-19 DIAGNOSIS — Z9989 Dependence on other enabling machines and devices: Secondary | ICD-10-CM | POA: Diagnosis not present

## 2021-12-19 DIAGNOSIS — K7682 Hepatic encephalopathy: Secondary | ICD-10-CM | POA: Diagnosis not present

## 2021-12-19 DIAGNOSIS — N250 Renal osteodystrophy: Secondary | ICD-10-CM | POA: Diagnosis not present

## 2021-12-19 DIAGNOSIS — Z7189 Other specified counseling: Secondary | ICD-10-CM | POA: Diagnosis not present

## 2021-12-19 DIAGNOSIS — J449 Chronic obstructive pulmonary disease, unspecified: Secondary | ICD-10-CM | POA: Diagnosis not present

## 2021-12-19 DIAGNOSIS — Z992 Dependence on renal dialysis: Secondary | ICD-10-CM | POA: Diagnosis not present

## 2021-12-19 DIAGNOSIS — R188 Other ascites: Secondary | ICD-10-CM | POA: Diagnosis not present

## 2021-12-19 DIAGNOSIS — G9341 Metabolic encephalopathy: Secondary | ICD-10-CM | POA: Diagnosis not present

## 2021-12-19 DIAGNOSIS — I482 Chronic atrial fibrillation, unspecified: Secondary | ICD-10-CM | POA: Diagnosis not present

## 2021-12-19 DIAGNOSIS — K922 Gastrointestinal hemorrhage, unspecified: Secondary | ICD-10-CM | POA: Diagnosis not present

## 2021-12-19 DIAGNOSIS — J9601 Acute respiratory failure with hypoxia: Secondary | ICD-10-CM | POA: Diagnosis present

## 2021-12-19 DIAGNOSIS — R0602 Shortness of breath: Secondary | ICD-10-CM | POA: Diagnosis not present

## 2021-12-19 DIAGNOSIS — E1122 Type 2 diabetes mellitus with diabetic chronic kidney disease: Secondary | ICD-10-CM | POA: Diagnosis present

## 2021-12-19 LAB — CBC
HCT: 25.5 % — ABNORMAL LOW (ref 36.0–46.0)
Hemoglobin: 8.5 g/dL — ABNORMAL LOW (ref 12.0–15.0)
MCH: 31.5 pg (ref 26.0–34.0)
MCHC: 33.3 g/dL (ref 30.0–36.0)
MCV: 94.4 fL (ref 80.0–100.0)
Platelets: 105 10*3/uL — ABNORMAL LOW (ref 150–400)
RBC: 2.7 MIL/uL — ABNORMAL LOW (ref 3.87–5.11)
RDW: 21.9 % — ABNORMAL HIGH (ref 11.5–15.5)
WBC: 11 10*3/uL — ABNORMAL HIGH (ref 4.0–10.5)
nRBC: 0 % (ref 0.0–0.2)

## 2021-12-19 LAB — RENAL FUNCTION PANEL
Albumin: 2.2 g/dL — ABNORMAL LOW (ref 3.5–5.0)
Anion gap: 8 (ref 5–15)
BUN: 35 mg/dL — ABNORMAL HIGH (ref 8–23)
CO2: 26 mmol/L (ref 22–32)
Calcium: 8.3 mg/dL — ABNORMAL LOW (ref 8.9–10.3)
Chloride: 98 mmol/L (ref 98–111)
Creatinine, Ser: 2.87 mg/dL — ABNORMAL HIGH (ref 0.44–1.00)
GFR, Estimated: 16 mL/min — ABNORMAL LOW (ref >60)
Glucose, Bld: 115 mg/dL — ABNORMAL HIGH (ref 70–99)
Phosphorus: 3.8 mg/dL (ref 2.5–4.6)
Potassium: 3.5 mmol/L (ref 3.5–5.1)
Sodium: 132 mmol/L — ABNORMAL LOW (ref 135–145)

## 2021-12-19 LAB — MAGNESIUM: Magnesium: 2 mg/dL (ref 1.7–2.4)

## 2021-12-19 MED ORDER — NEPRO/CARBSTEADY PO LIQD: 237.0000 mL | Freq: Two times a day (BID) | ORAL | 0 refills | Status: DC

## 2021-12-19 MED ORDER — RIFAXIMIN 200 MG PO TABS: 200.0000 mg | ORAL_TABLET | Freq: Two times a day (BID) | ORAL | Status: DC

## 2021-12-19 MED ORDER — AMIODARONE HCL 200 MG PO TABS: 200.0000 mg | ORAL_TABLET | Freq: Every day | ORAL | Status: AC

## 2021-12-19 MED ORDER — DOCUSATE SODIUM 100 MG PO CAPS: 100.0000 mg | ORAL_CAPSULE | Freq: Two times a day (BID) | ORAL | 0 refills | Status: DC

## 2021-12-19 MED ORDER — NITROGLYCERIN 0.4 MG SL SUBL: 0.4000 mg | SUBLINGUAL_TABLET | SUBLINGUAL | 12 refills | Status: AC | PRN

## 2021-12-19 MED ORDER — DICLOFENAC SODIUM 1 % EX GEL: 2.0000 g | Freq: Three times a day (TID) | CUTANEOUS | Status: DC | PRN

## 2021-12-19 MED ORDER — LIDOCAINE 5 % EX PTCH: 2.0000 | MEDICATED_PATCH | CUTANEOUS | 0 refills | Status: DC

## 2021-12-19 MED ORDER — PROSOURCE PLUS PO LIQD: 30.0000 mL | Freq: Two times a day (BID) | ORAL | Status: DC

## 2021-12-19 MED ORDER — TORSEMIDE 40 MG PO TABS: 40.0000 mg | ORAL_TABLET | ORAL | Status: DC

## 2021-12-19 MED ORDER — LACTULOSE 10 GM/15ML PO SOLN: 20.0000 g | Freq: Two times a day (BID) | ORAL | 0 refills | Status: DC

## 2021-12-19 MED ORDER — RENA-VITE PO TABS: 1.0000 | ORAL_TABLET | Freq: Every day | ORAL | 0 refills | Status: DC

## 2021-12-19 MED ORDER — PANTOPRAZOLE SODIUM 40 MG PO TBEC: 40.0000 mg | DELAYED_RELEASE_TABLET | Freq: Two times a day (BID) | ORAL | Status: DC

## 2021-12-19 NOTE — Progress Notes (Signed)
Report given to Almyra Free, RN from [place] facility. Spoke with son via phone, son to transport patient to facility, states he will pick her up in about 30 minutes.

## 2021-12-19 NOTE — Progress Notes (Signed)
[facility]  PROGRESS NOTE   Subjective:   Patient seen resting in bed, states she is doing well today. Appetite improving, denies nausea and vomiting Lower extremity edema remains however greatly improved.,  States she was able to ambulate around nurses station yesterday. States she is ready to discharge to rehab today  Objective:  Vital signs: Blood pressure (!) 108/51, pulse 67, temperature 98.1 F (36.7 C), temperature source Oral, resp. rate 18, height 5' (1.524 m), weight 83.6 kg, last menstrual period 05/01/1972, SpO2 94 %.  Intake/Output Summary (Last 24 hours) at 12/19/2021 1607 Last data filed at 12/19/2021 1028 Gross per 24 hour  Intake 250 ml  Output 100 ml  Net 150 ml    Filed Weights   12/17/21 1620 12/18/21 0431 12/19/21 0615  Weight: 87.3 kg 90.6 kg 83.6 kg     Physical Exam: General:  No acute distress  Head:  Normocephalic, atraumatic. Moist oral mucosal membranes  Eyes:  Anicteric  Lungs:   Clear to auscultation, normal effort  Heart:  S1S2 no rubs  Abdomen:   Soft, nontender, bowel sounds present  Extremities: 1-2+ peripheral edema.  Neurologic:  Awake, alert, following commands  Skin:  No lesions  Access: Right IJ perm cath    Basic Metabolic Panel: Recent Labs  Lab 12/13/21 0440 12/14/21 0517 12/15/21 0437 12/16/21 0421 12/17/21 0700 12/17/21 1929 12/18/21 0610 12/19/21 0434 12/19/21 0910  NA 133* 133*  --   --   --  133*  --   --  132*  K 3.2* 3.2*  --   --   --  3.3*  --   --  3.5  CL 100 101  --   --   --  98  --   --  98  CO2 26 25  --   --   --  28  --   --  26  GLUCOSE 99 100*  --   --   --  97  --   --  115*  BUN 28* 42*  --   --   --  27*  --   --  35*  CREATININE 1.94* 2.37*  --   --   --  1.84*  --   --  2.87*  CALCIUM 8.2* 8.2*  --   --   --  7.5*  --   --  8.3*  MG 1.7 2.0 1.7 1.9 1.9  --  1.7 2.0  --   PHOS 2.8 3.1  --   --   --  2.1*  --   --  3.8     CBC: Recent Labs  Lab 12/14/21 0517  12/16/21 2141 12/17/21 0700 12/17/21 1929 12/17/21 2058 12/18/21 0610 12/19/21 0910  WBC 11.7*  --  10.3 11.1*  --  10.1 11.0*  HGB 8.1*   < > 7.1* 6.8* 8.5* 7.9* 8.5*  HCT 24.0*   < > 20.9* 20.3* 24.8* 23.2* 25.5*  MCV 91.6  --  95.9 94.4  --  93.2 94.4  PLT 105*  --  96* 76*  --  80* 105*   < > = values in this interval not displayed.      Urinalysis: No results for input(s): "COLORURINE", "LABSPEC", "PHURINE", "GLUCOSEU", "HGBUR", "BILIRUBINUR", "KETONESUR", "PROTEINUR", "UROBILINOGEN", "NITRITE", "LEUKOCYTESUR" in the last 72 hours.  Invalid input(s): "APPERANCEUR"    Imaging: No results found.   Medications:    sodium chloride Stopped (12/03/21 1136)   promethazine (PHENERGAN) injection (IM or IVPB)  200 mL/hr at 12/18/21 0004    (feeding supplement) PROSource Plus  30 mL Oral BID BM   amiodarone  200 mg Oral Daily   Chlorhexidine Gluconate Cloth  6 each Topical Q0600   darbepoetin (ARANESP) injection - NON-DIALYSIS  100 mcg Subcutaneous Q Fri-1800   docusate sodium  100 mg Oral BID   feeding supplement (NEPRO CARB STEADY)  237 mL Oral BID BM   lactulose  20 g Oral BID   lidocaine  2 patch Transdermal Q24H   multivitamin  1 tablet Oral QHS   pantoprazole  40 mg Oral BID   rifaximin  200 mg Oral BID   sodium chloride flush  10-40 mL Intracatheter Q12H   torsemide  40 mg Oral Once per day on Mon Wed Fri    Assessment/ Plan:     Principal Problem:   Acute renal failure superimposed on stage 4 chronic kidney disease ([place]) Active Problems:   Essential hypertension, benign   Hyperlipidemia   OSA on CPAP   Morbid obesity ([place])   Coronary artery disease involving native coronary artery of native heart with unstable angina pectoris (HCC)   Paroxysmal atrial fibrillation (HCC)   Chronic obstructive pulmonary disease, unspecified COPD type (HCC)   Chronic diastolic CHF (congestive heart failure) (HCC)   Hypokalemia   Chronic kidney disease (CKD), stage IV  (severe) (HCC)   Hyponatremia   Anemia of chronic kidney failure, stage 4 (severe) (HCC)   Edema and ascites    Acute metabolic encephalopathy   Acute hepatic encephalopathy (HCC)   Cirrhosis (HCC)   Thrombocytopenia ([place])  [name] is a 81 y.o.  female with past medical history including diastolic CHF, anemia, CAD, paroxysmal atrial fibrillation without anticoagulation.  GI bleed.  Chronic kidney disease stage IV.  Patient reports to ED with complaints of lower extremity edema.  Patient has been admitted for Acute CHF (congestive heart failure) ([place]) [I50.9] Acute decompensated heart failure ([place]) [I50.9]   #1: Acute kidney injury on CKD stage IV: Patient was started on dialysis secondary to fluid overload and mental status changes.    Next dialysis treatment scheduled for Tuesday.  We will continue to monitor labs in outpatient setting to determine renal recovery.  Renal navigator has confirmed outpatient placement at [facility] on TTS schedule.   #2: Anemia with chronic kidney disease: Hemoglobin 8.5. Continue weekly Aranesp.     #3: Congestive heart failure: Grade 2 diastolic dysfunction with volume overload-2D echo 12/08/2021 -Continue torsemide on nondialysis days.   LOS: [facility] 8/21/20234:07 PM

## 2021-12-19 NOTE — Care Management Important Message (Signed)
Important Message  Patient Details  Name: April [name] MRN: [id number] Date of Birth: 1941-03-16   Medicare Important Message Given:  Yes     Dannette Barbara 12/19/2021, 9:32 AM

## 2021-12-19 NOTE — Progress Notes (Signed)
Mobility Specialist - Progress Note    12/19/21 1140  Mobility  Activity Ambulated with assistance in hallway  Level of Assistance Standby assist, set-up cues, supervision of patient - no hands on  Assistive Device Front wheel walker  Distance Ambulated (ft) 160 ft  Activity Response Tolerated well  $Mobility charge 1 Mobility   Pt sitting in recliner on RA upon arrival. Pt STS and ambulates 1 lap around NS SBA (No hands on). Pt returns to recliner with needs in reach.  Gretchen Short  Mobility Specialist  12/19/21 11:42 AM

## 2021-12-19 NOTE — TOC Transition Note (Signed)
Transition of Care [facility]) - CM/SW Discharge Note   Patient Details  Name: April [name] MRN: [id number] Date of Birth: 1940-09-08  Transition of Care [facility]) CM/SW Contact:  Candie Chroman, LCSW Phone Number: 12/19/2021, 12:23 PM   Clinical Narrative:  Patient has orders to discharge to [facility] today. RN will call report to [phone] (Room 332). Son will transport. Asked RN to call him to coordinate pickup time. No further concerns. CSW signing off.   Final next level of care: Skilled Nursing Facility Barriers to Discharge: Barriers Resolved   Patient Goals and CMS Choice     Choice offered to / list presented to : Patient, Adult Children  Discharge Placement                Patient to be transferred to facility by: Son Name of family member notified: Fermina Mishkin Patient and family notified of of transfer: 12/19/21  Discharge Plan and Services     Post Acute Care Choice: Resumption of Svcs/PTA Provider                    HH Arranged: RN, PT, OT [facility] Agency: [place] ([place]) Date [place]: 11/28/21   Representative spoke with at [place]: Floydene Flock  Social Determinants of Health (SDOH) Interventions     Readmission Risk Interventions    11/28/2021   11:17 AM 10/19/2021   12:27 PM  Readmission Risk Prevention Plan  Transportation Screening Complete Complete  PCP or Specialist Appt within 3-5 Days Complete Complete  HRI or [place] Complete Complete  Social Work Consult for [place] Planning/Counseling Complete Complete  Palliative Care Screening Not Applicable Complete  Medication Review Press photographer) Complete Complete

## 2021-12-19 NOTE — Discharge Summary (Signed)
April [name]:[id number] DOB: Dec 05, 1940 DOA: 11/23/2021  PCP: Einar Pheasant, MD  Admit date: 11/23/2021 Discharge date: 12/19/2021  Admitted From: home Disposition:  home  Recommendations for Outpatient Follow-up:  Follow up with PCP in 1 week Please obtain BMP/CBC in one week Please follow up with hemodialysis Outpatient spot available at [place].     Discharge Condition:Stable CODE STATUS:DNR  Diet recommendation: renal/2gram sodium  Brief/Interim Summary: PER Chart:81 yo F with dCHF, AF on amiodarone no AC, CKD IV baseline 2-2.4 who presented for abdominal and leg swelling and weight gain.   Patient was initially thought to have CHF or nephrogenic edema, and so Nephrology were consulted and she was given escalating doses of Lasix. Ultimately she developed hepatic encephalopathy, and it became clear her weight gain was ascites and hepatic swelling was a major contributor.   In the meantime, she failed to respond to diuretics, and her renal function worsened and she was started on dialysis.  She had permacath placed. Started on dialysis which she has been tolerating.  Her hemoglobin had dropped and she required 1 unit PRBC on 8/19.  H&H is stable.  She is having bowel movements.  Her mental status is at baseline.  She is stable for discharge   Acute renal failure superimposed on stage 4 chronic kidney disease ([place]) Patient was started on dialysis secondary to fluid overload and mental status changes.   status post permacath on 8/17 by Dr. Lucky Cowboy  Nephrology was following Received dialysis here and tolerated outpatient placement at [facility] on TTS schedule     Anemia of chronic kidney failure, stage 4 (severe) ([place]) Iron studies in June showed ferritin 24.  Got IV iron this admission.  Transfused 1 unit 8/13 for Hgb 6.8 g/dL.   8/19 s/p transfuse 1 unit of packed red blood cell  Started on Ar[place] H/H stable. Monitor as outpatient             Edema and ascites  Cardiology consulted for ?right heart cath needed.  They repeated echo which ruled out a significant component of right heart failure/pHTN.  -Her swelling is multifactorial from cirrhosis, third spacing/hypoalbuminemia, worsening renal failure, venous insufficiency. Acute congestive heart failure ruled out.  -US abdomen showed mild ascites only, not enough to paracentesis at this point. repeat US without change         Acute hepatic encephalopathy ([place]) Patient with acute encephalopathy 8/11, confused, somnolent.  Ammonia checked and it was up to 159.  Started on lactulose and now having 2-3 BMs per day, mentation improved. Last Ammonium 23 Mental status improved at baseline  Continue lactulose and rifaximin                    Acute metabolic encephalopathy Due to hepatic encephalopathy Appears to have improved   Cirrhosis ([place]) The patient has had radiographic evidence of cirrhosis for a long time, but appeared fairly well compensated.   F/u with pcp or GI in 1-2 weeks    Thrombocytopenia (HCC) Platelets slowly trending up.  Monitor as outpatient by pcp   Hyponatremia - Defer HD to Nephrology      Hypokalemia Resolved    Chronic diastolic CHF (congestive heart failure) (HCC) On torsemide On HD    Chronic obstructive pulmonary disease, unspecified COPD type ([place]) No active disease   Paroxysmal atrial fibrillation ([place]) Not on anticoagulation - Continue amiodarone   Coronary artery disease involving native coronary artery of native heart with unstable angina  pectoris ([place]) No aspirin due to GI bleed.  No beta-blocker, ARB due to hypotension.  No statin due to cirrhosis.     Morbid obesity (HCC) BMI 36    OSA on CPAP - CPAP at night   Hyperlipidemia Not on statin   Essential hypertension, benign stable      Discharge Diagnoses:  Principal Problem:   Acute renal failure superimposed on stage 4 chronic kidney disease  (HCC) Active Problems:   Anemia of chronic kidney failure, stage 4 (severe) (HCC)   Edema and ascites    Acute metabolic encephalopathy   Acute hepatic encephalopathy (HCC)   Cirrhosis (HCC)   Essential hypertension, benign   Hyperlipidemia   OSA on CPAP   Morbid obesity ([place])   Coronary artery disease involving native coronary artery of native heart with unstable angina pectoris (HCC)   Paroxysmal atrial fibrillation (HCC)   Chronic obstructive pulmonary disease, unspecified COPD type (HCC)   Chronic diastolic CHF (congestive heart failure) (HCC)   Hypokalemia   Chronic kidney disease (CKD), stage IV (severe) (HCC)   Hyponatremia   Thrombocytopenia (HCC)    Discharge Instructions  Discharge Instructions     Diet - low sodium heart healthy   Complete by: As directed    Discharge wound care:   Complete by: As directed    As above   Increase activity slowly   Complete by: As directed       Allergies as of 12/19/2021       Reactions   Penicillins Other (See Comments)   Okay to take amoxicillin/(pt does not recall what the reaction to penicillin was (64-87 years old)   Sulfa Antibiotics Rash        Medication List     STOP taking these medications    acetaminophen 500 MG tablet Commonly known as: TYLENOL   Advair HFA 230-21 MCG/ACT inhaler Generic drug: fluticasone-salmeterol   albuterol 108 (90 Base) MCG/ACT inhaler Commonly known as: VENTOLIN HFA   cholecalciferol 25 MCG (1000 UNIT) tablet Commonly known as: VITAMIN D3   Glucosamine-Chondroit-Collagen 250-200-116.67 MG Caps   metolazone 2.5 MG tablet Commonly known as: ZAROXOLYN   mupirocin ointment 2 % Commonly known as: BACTROBAN   potassium chloride SA 20 MEQ tablet Commonly known as: KLOR-CON M   Systane Complete 0.6 % Soln Generic drug: Propylene Glycol   SYSTANE OP       TAKE these medications    amiodarone 200 MG tablet Commonly known as: PACERONE Take 1 tablet (200 mg total)  by mouth daily. Start taking on: December 20, 2021   diclofenac Sodium 1 % Gel Commonly known as: VOLTAREN Apply 2 g topically 3 (three) times daily as needed (pain in legs and elbows).   docusate sodium 100 MG capsule Commonly known as: Stool Softener Take 1 capsule (100 mg total) by mouth 2 (two) times daily. What changed:  medication strength how much to take   feeding supplement (NEPRO CARB STEADY) Liqd Take 237 mLs by mouth 2 (two) times daily between meals.   (feeding supplement) PROSource Plus liquid Take 30 mLs by mouth 2 (two) times daily between meals.   lactulose 10 GM/15ML solution Commonly known as: CHRONULAC Take 30 mLs (20 g total) by mouth 2 (two) times daily.   lidocaine 5 % Commonly known as: LIDODERM Place 2 patches onto the skin daily. Remove & Discard patch within 12 hours or as directed by MD   multivitamin Tabs tablet Take 1 tablet by mouth at bedtime.  nitroGLYCERIN 0.4 MG SL tablet Commonly known as: Nitrostat Place 1 tablet (0.4 mg total) under the tongue every 5 (five) minutes as needed for chest pain. What changed: reasons to take this   pantoprazole 40 MG tablet Commonly known as: PROTONIX Take 1 tablet (40 mg total) by mouth 2 (two) times daily.   rifaximin 200 MG tablet Commonly known as: XIFAXAN Take 1 tablet (200 mg total) by mouth 2 (two) times daily.   Torsemide 40 MG Tabs Take 40 mg by mouth 3 (three) times a week. Start taking on: December 21, 2021 What changed:  medication strength when to take this Another medication with the same name was removed. Continue taking this medication, and follow the directions you see here.               Discharge Care Instructions  (From admission, onward)           Start     Ordered   12/19/21 0000  Discharge wound care:       Comments: As above   12/19/21 1155            Follow-up Information     [place] Follow up.   Why: They will resume home health  services at discharge.        Minna Merritts, MD Follow up in 2 week(s).   Specialty: Cardiology Contact information: [place] [address] [phone]         Einar Pheasant, MD Follow up in 1 week(s).   Specialty: Internal Medicine Contact information: [address] [place] [address] [phone]                Allergies  Allergen Reactions   Penicillins Other (See Comments)    Okay to take amoxicillin/(pt does not recall what the reaction to penicillin was (50-51 years old)    Sulfa Antibiotics Rash    Consultations: Current neurology, nephrology, vascular surgeon   Procedures/Studies: US Abdomen Limited  Result Date: 12/17/2021 CLINICAL DATA:  Assess for ascites. EXAM: LIMITED ABDOMEN ULTRASOUND FOR ASCITES TECHNIQUE: Limited ultrasound survey for ascites was performed in all four abdominal quadrants. COMPARISON:  12/12/2021. FINDINGS: Small amount of ascites noted in all 4 quadrants similar to the recent prior ultrasound. IMPRESSION: 1. Small amount of ascites without significant change from the study dated 12/12/2021. Electronically Signed   By: Lajean Manes M.D.   On: 12/17/2021 15:30   PERIPHERAL VASCULAR CATHETERIZATION  Result Date: 12/15/2021 See surgical note for result.  US Abdomen Limited  Result Date: 12/12/2021 CLINICAL DATA:  Abdominal distension and cirrhosis. EXAM: LIMITED ABDOMEN ULTRASOUND FOR ASCITES TECHNIQUE: Limited ultrasound survey for ascites was performed in all four abdominal quadrants. COMPARISON:  11/08/2021.  CT 11/29/2020 FINDINGS: Small volume ascites identified within all 4 quadrants. IMPRESSION: Small volume abdominal ascites. Electronically Signed   By: Abigail Miyamoto M.D.   On: 12/12/2021 13:41   DG Chest Port 1 View  Result Date: 12/09/2021 CLINICAL DATA:  Altered mental status. EXAM: PORTABLE CHEST 1 VIEW COMPARISON:  Chest radiographs 11/23/2021 FINDINGS: The  cardiomediastinal silhouette is unchanged with normal heart size. A moderately large hiatal hernia is again noted. No airspace consolidation, edema, or pneumothorax is identified. Trace pleural effusions are questioned. No acute osseous abnormality is seen. IMPRESSION: Possible trace pleural effusions. No evidence of edema or pneumonia. Electronically Signed   By: Logan Bores M.D.   On: 12/09/2021 09:22   ECHOCARDIOGRAM COMPLETE  Result Date: 12/08/2021    ECHOCARDIOGRAM REPORT   Patient Name:   April [name] Date of Exam: 12/08/2021 Medical Rec #:  [id number]       Height:       60.0 in Accession #:    [id number]      Weight:       195.5 lb Date of Birth:  1941/02/23       BSA:          1.849 m Patient Age:    81 years        BP:           112/61 mmHg Patient Gender: F               HR:           63 bpm. Exam Location:  [facility] Procedure: 2D Echo, Cardiac Doppler and Color Doppler Indications:     Cardiomyopathy --Unspecified I 42.9  History:         Patient has prior history of Echocardiogram examinations, most                  recent 08/10/2021. CHF, Arrythmias:LBBB; Risk Factors:Diabetes.                  STEMI.  Sonographer:     Sherrie Sport Referring Phys:  [id number] CHRISTOPHER P DANFORD Diagnosing Phys: Kathlyn Sacramento MD IMPRESSIONS  1. Left ventricular ejection fraction, by estimation, is 55 to 60%. The left ventricle has normal function. The left ventricle has no regional wall motion abnormalities. Left ventricular diastolic parameters are consistent with Grade II diastolic dysfunction (pseudonormalization).  2. Right ventricular systolic function is normal. The right ventricular size is normal. There is mildly elevated pulmonary artery systolic pressure. The estimated right ventricular systolic pressure is 05.3 mmHg.  3. Left atrial size was mildly dilated.  4. The mitral valve is normal in structure. No evidence of mitral valve regurgitation. No evidence of mitral stenosis.  5. The aortic valve is normal in  structure. Aortic valve regurgitation is not visualized. Aortic valve sclerosis is present, with no evidence of aortic valve stenosis. FINDINGS  Left Ventricle: Left ventricular ejection fraction, by estimation, is 55 to 60%. The left ventricle has normal function. The left ventricle has no regional wall motion abnormalities. The left ventricular internal cavity size was normal in size. There is  borderline left ventricular hypertrophy. Left ventricular diastolic parameters are consistent with Grade II diastolic dysfunction (pseudonormalization). Right Ventricle: The right ventricular size is normal. No increase in right ventricular wall thickness. Right ventricular systolic function is normal. There is mildly elevated pulmonary artery systolic pressure. The tricuspid regurgitant velocity is 3.01  m/s, and with an assumed right atrial pressure of 5 mmHg, the estimated right ventricular systolic pressure is 97.6 mmHg. Left Atrium: Left atrial size was mildly dilated. Right Atrium: Right atrial size was normal in size. Pericardium: There is no evidence of pericardial effusion. Mitral Valve: The mitral valve is normal in structure. No evidence of mitral valve regurgitation. No evidence of mitral valve stenosis. Tricuspid Valve: The tricuspid valve is normal in structure. Tricuspid valve regurgitation is mild . No evidence of tricuspid stenosis. Aortic Valve: The aortic valve is normal in structure. Aortic valve regurgitation is not visualized. Aortic valve sclerosis is present, with no evidence of aortic valve stenosis. Aortic valve mean gradient measures 9.3 mmHg. Aortic valve peak gradient measures 16.4 mmHg. Aortic valve area, by VTI measures 0.83 cm. Pulmonic Valve: The  pulmonic valve was normal in structure. Pulmonic valve regurgitation is not visualized. No evidence of pulmonic stenosis. Aorta: The aortic root is normal in size and structure. Venous: The inferior vena cava was not well visualized. IAS/Shunts: No  atrial level shunt detected by color flow Doppler.  LEFT VENTRICLE PLAX 2D LVIDd:         4.60 cm   Diastology LVIDs:         3.20 cm   LV e' medial:    4.13 cm/s LV PW:         1.00 cm   LV E/e' medial:  23.8 LV IVS:        1.00 cm   LV e' lateral:   7.62 cm/s LVOT diam:     1.25 cm   LV E/e' lateral: 12.9 LV SV:         39 LV SV Index:   21 LVOT Area:     1.23 cm  RIGHT VENTRICLE RV Basal diam:  4.30 cm RV S prime:     14.10 cm/s TAPSE (M-mode): 3.3 cm LEFT ATRIUM             Index        RIGHT ATRIUM           Index LA diam:        4.10 cm 2.22 cm/m   RA Area:     21.00 cm LA Vol (A2C):   81.9 ml 44.30 ml/m  RA Volume:   59.50 ml  32.18 ml/m LA Vol (A4C):   79.4 ml 42.95 ml/m LA Biplane Vol: 86.6 ml 46.84 ml/m  AORTIC VALVE AV Area (Vmax):    0.69 cm AV Area (Vmean):   0.78 cm AV Area (VTI):     0.83 cm AV Vmax:           202.67 cm/s AV Vmean:          142.667 cm/s AV VTI:            0.465 m AV Peak Grad:      16.4 mmHg AV Mean Grad:      9.3 mmHg LVOT Vmax:         114.00 cm/s LVOT Vmean:        90.900 cm/s LVOT VTI:          0.314 m LVOT/AV VTI ratio: 0.67  AORTA Ao Root diam: 2.63 cm MITRAL VALVE               TRICUSPID VALVE MV Area (PHT): 3.27 cm    TR Peak grad:   36.2 mmHg MV Decel Time: 232 msec    TR Vmax:        301.00 cm/s MV E velocity: 98.50 cm/s MV A velocity: 99.00 cm/s  SHUNTS MV E/A ratio:  0.99        Systemic VTI:  0.31 m                            Systemic Diam: 1.25 cm Kathlyn Sacramento MD Electronically signed by Kathlyn Sacramento MD Signature Date/Time: 12/08/2021/5:41:58 PM    Final    PERIPHERAL VASCULAR CATHETERIZATION  Result Date: 12/08/2021 See surgical note for result.  US RENAL  Result Date: 12/06/2021 CLINICAL DATA:  Abdominal ultrasound 11/08/2021. Abdominopelvic CT 11/29/2020. EXAM: RENAL / URINARY TRACT ULTRASOUND COMPLETE COMPARISON:  None Available. FINDINGS: Right Kidney: Renal measurements: 9.8 x 3.8 x 4.3 cm = volume:  79.5 mL. Echogenicity within normal limits.  No mass or hydronephrosis visualized. Left Kidney: Renal measurements: 10.4 x 4.5 x 3.5 cm = volume: 85.4 mL. Small cysts measuring up to 3.2 cm in the lower pole. No evidence of solid renal mass or hydronephrosis. Bladder: Appears normal for degree of bladder distention. Other: Ascites noted. The visualized liver demonstrates contour irregularity, consistent with cirrhosis. IMPRESSION: 1. Both kidneys are similar in size, without hydronephrosis. 2. Hepatic cirrhosis and ascites. Electronically Signed   By: Richardean Sale M.D.   On: 12/06/2021 16:35   DG Chest 2 View  Result Date: 11/23/2021 CLINICAL DATA:  Shortness of breath, leg swelling EXAM: CHEST - 2 VIEW COMPARISON:  10/20/2021 FINDINGS: Cardiac size at the upper limit of normal. Aortic atherosclerosis. No focal pulmonary opacity. No pleural effusion or pneumothorax. Moderate hiatal hernia. No acute osseous abnormality. IMPRESSION: No acute cardiopulmonary process. Electronically Signed   By: Merilyn Baba M.D.   On: 11/23/2021 15:40      Subjective: No complaints. Denies sob, cp, abd pain  Discharge Exam: Vitals:   12/19/21 0355 12/19/21 0829  BP: (!) 114/49 (!) 108/51  Pulse: 65 67  Resp: 14 18  Temp: 98.1 F (36.7 C) 98.1 F (36.7 C)  SpO2: 94% 94%   Vitals:   12/19/21 0020 12/19/21 0355 12/19/21 0615 12/19/21 0829  BP: (!) 117/51 (!) 114/49  (!) 108/51  Pulse: 66 65  67  Resp: '14 14  18  '$ Temp: 98.1 F (36.7 C) 98.1 F (36.7 C)  98.1 F (36.7 C)  TempSrc: Oral Oral  Oral  SpO2: 97% 94%  94%  Weight:   83.6 kg   Height:        General: Pt is alert, awake, not in acute distress Cardiovascular: RRR, S1/S2 +, no rubs, no gallops Respiratory: CTA bilaterally, no wheezing, no rhonchi Abdominal: Soft, NT, ND, bowel sounds + Extremities: +LE edema    The results of significant diagnostics from this hospitalization (including imaging, microbiology, ancillary and laboratory) are listed below for reference.      Microbiology: No results found for this or any previous visit (from the past 240 hour(s)).   Labs: BNP (last 3 results) Recent Labs    09/15/21 1321 10/17/21 1534 11/24/21 0505  BNP 363.9* 296.7* 829.9*   Basic Metabolic Panel: Recent Labs  Lab 12/13/21 0440 12/14/21 0517 12/15/21 0437 12/16/21 0421 12/17/21 0700 12/17/21 1929 12/18/21 0610 12/19/21 0434 12/19/21 0910  NA 133* 133*  --   --   --  133*  --   --  132*  K 3.2* 3.2*  --   --   --  3.3*  --   --  3.5  CL 100 101  --   --   --  98  --   --  98  CO2 26 25  --   --   --  28  --   --  26  GLUCOSE 99 100*  --   --   --  97  --   --  115*  BUN 28* 42*  --   --   --  27*  --   --  35*  CREATININE 1.94* 2.37*  --   --   --  1.84*  --   --  2.87*  CALCIUM 8.2* 8.2*  --   --   --  7.5*  --   --  8.3*  MG 1.7 2.0 1.7 1.9 1.9  --  1.7 2.0  --  PHOS 2.8 3.1  --   --   --  2.1*  --   --  3.8   Liver Function Tests: Recent Labs  Lab 12/13/21 0440 12/14/21 0517 12/17/21 1929 12/19/21 0910  ALBUMIN 2.2* 2.1* 2.1* 2.2*   No results for input(s): "LIPASE", "AMYLASE" in the last 168 hours. Recent Labs  Lab 12/14/21 1221  AMMONIA 23   CBC: Recent Labs  Lab 12/14/21 0517 12/16/21 2141 12/17/21 0700 12/17/21 1929 12/17/21 2058 12/18/21 0610 12/19/21 0910  WBC 11.7*  --  10.3 11.1*  --  10.1 11.0*  HGB 8.1*   < > 7.1* 6.8* 8.5* 7.9* 8.5*  HCT 24.0*   < > 20.9* 20.3* 24.8* 23.2* 25.5*  MCV 91.6  --  95.9 94.4  --  93.2 94.4  PLT 105*  --  96* 76*  --  80* 105*   < > = values in this interval not displayed.   Cardiac Enzymes: No results for input(s): "CKTOTAL", "CKMB", "CKMBINDEX", "TROPONINI" in the last 168 hours. BNP: Invalid input(s): "POCBNP" CBG: No results for input(s): "GLUCAP" in the last 168 hours. D-Dimer No results for input(s): "DDIMER" in the last 72 hours. Hgb A1c No results for input(s): "HGBA1C" in the last 72 hours. Lipid Profile No results for input(s): "CHOL", "HDL",  "LDLCALC", "TRIG", "CHOLHDL", "LDLDIRECT" in the last 72 hours. Thyroid function studies No results for input(s): "TSH", "T4TOTAL", "T3FREE", "THYROIDAB" in the last 72 hours.  Invalid input(s): "FREET3" Anemia work up No results for input(s): "VITAMINB12", "FOLATE", "FERRITIN", "TIBC", "IRON", "RETICCTPCT" in the last 72 hours. Urinalysis    Component Value Date/Time   COLORURINE STRAW (A) 10/17/2021 2030   APPEARANCEUR CLEAR (A) 10/17/2021 2030   LABSPEC 1.005 10/17/2021 2030   PHURINE 6.0 10/17/2021 2030   GLUCOSEU NEGATIVE 10/17/2021 2030   GLUCOSEU NEGATIVE 12/15/2016 1117   HGBUR MODERATE (A) 10/17/2021 2030   BILIRUBINUR NEGATIVE 10/17/2021 2030   KETONESUR NEGATIVE 10/17/2021 2030   PROTEINUR 30 (A) 10/17/2021 2030   UROBILINOGEN 0.2 12/15/2016 1117   NITRITE NEGATIVE 10/17/2021 2030   LEUKOCYTESUR NEGATIVE 10/17/2021 2030   Sepsis Labs Recent Labs  Lab 12/17/21 0700 12/17/21 1929 12/18/21 0610 12/19/21 0910  WBC 10.3 11.1* 10.1 11.0*   Microbiology No results found for this or any previous visit (from the past 240 hour(s)).   Time coordinating discharge: Over 30 minutes  SIGNED:   Nolberto Hanlon, MD  [facility] 12/19/2021, 12:20 PM Pager   If 7PM-7AM, please contact night-coverage [email or web address] Password [email or web address]

## 2021-12-19 NOTE — Telephone Encounter (Signed)
Caryl Pina called from [employer] at [place] to state patient was just discharged from the hospital and needs to follow-up with Dr. Einar Pheasant in one week.  I let Caryl Pina know that Dr. Nicki Reaper does not have an appointment available in one week, so I will send her a note to see how she would like to schedule.  Caryl Pina states she is concerned about patient who has CHF and will be starting dialysis three days per week.

## 2021-12-19 NOTE — Consult Note (Signed)
[place] Nurse Consult Note: Reason for Consult:blisters, posterior thigh Wound type: partial thickness, serous filled blisters Pressure Injury POA: NA Wound bed: serous filled blisters, several unroofed at this point Drainage (amount, consistency, odor) serous Periwound:  intact, edematous LEs  Dressing procedure/placement/frequency: Continue silicone foam per nursing skin care order set.   Discussed POC with patient and bedside nurse.  Re consult if needed, will not follow at this time. Thanks  Patton Rabinovich [employer], RN,CWOCN, CNS, [place] [phone])

## 2021-12-20 DIAGNOSIS — N189 Chronic kidney disease, unspecified: Secondary | ICD-10-CM | POA: Diagnosis not present

## 2021-12-20 DIAGNOSIS — K7460 Unspecified cirrhosis of liver: Secondary | ICD-10-CM | POA: Diagnosis not present

## 2021-12-20 DIAGNOSIS — I5022 Chronic systolic (congestive) heart failure: Secondary | ICD-10-CM | POA: Diagnosis not present

## 2021-12-20 DIAGNOSIS — R188 Other ascites: Secondary | ICD-10-CM | POA: Diagnosis not present

## 2021-12-20 DIAGNOSIS — Z992 Dependence on renal dialysis: Secondary | ICD-10-CM | POA: Diagnosis not present

## 2021-12-20 DIAGNOSIS — N179 Acute kidney failure, unspecified: Secondary | ICD-10-CM | POA: Diagnosis not present

## 2021-12-20 DIAGNOSIS — E559 Vitamin D deficiency, unspecified: Secondary | ICD-10-CM | POA: Diagnosis not present

## 2021-12-20 DIAGNOSIS — N186 End stage renal disease: Secondary | ICD-10-CM | POA: Diagnosis not present

## 2021-12-20 DIAGNOSIS — I482 Chronic atrial fibrillation, unspecified: Secondary | ICD-10-CM | POA: Diagnosis not present

## 2021-12-20 DIAGNOSIS — D509 Iron deficiency anemia, unspecified: Secondary | ICD-10-CM | POA: Diagnosis not present

## 2021-12-20 LAB — HEPARIN INDUCED PLATELET AB (HIT ANTIBODY): Heparin Induced Plt Ab: 0.107 OD (ref 0.000–0.400)

## 2021-12-20 NOTE — Telephone Encounter (Signed)
Noted. Will follow for TCM.

## 2021-12-20 NOTE — Telephone Encounter (Signed)
Please confirm how pt is doing.  Current concerns?  She is planning to f/u with nephrology - dialysis.  Does she have f/u planned with cardiology or any other specialist.  Will forward to [place] for TCM call.

## 2021-12-20 NOTE — Telephone Encounter (Signed)
Lm for pt to cb - has sched appt w/ cardiology 9/22

## 2021-12-21 ENCOUNTER — Telehealth: Payer: Self-pay

## 2021-12-21 ENCOUNTER — Non-Acute Institutional Stay: Payer: Medicare Other | Admitting: Student

## 2021-12-21 DIAGNOSIS — I5032 Chronic diastolic (congestive) heart failure: Secondary | ICD-10-CM

## 2021-12-21 DIAGNOSIS — R188 Other ascites: Secondary | ICD-10-CM

## 2021-12-21 DIAGNOSIS — R531 Weakness: Secondary | ICD-10-CM

## 2021-12-21 DIAGNOSIS — N184 Chronic kidney disease, stage 4 (severe): Secondary | ICD-10-CM

## 2021-12-21 DIAGNOSIS — Z515 Encounter for palliative care: Secondary | ICD-10-CM

## 2021-12-21 NOTE — Telephone Encounter (Signed)
Transition Care Management Unsuccessful Follow-up Telephone Call  Date of discharge and from where:  12/19/21 [facility]  Attempts:  1st Attempt  Reason for unsuccessful TCM follow-up call:  No answer/busy

## 2021-12-22 DIAGNOSIS — E559 Vitamin D deficiency, unspecified: Secondary | ICD-10-CM | POA: Diagnosis not present

## 2021-12-22 DIAGNOSIS — N189 Chronic kidney disease, unspecified: Secondary | ICD-10-CM | POA: Diagnosis not present

## 2021-12-22 DIAGNOSIS — N179 Acute kidney failure, unspecified: Secondary | ICD-10-CM | POA: Diagnosis not present

## 2021-12-22 DIAGNOSIS — D509 Iron deficiency anemia, unspecified: Secondary | ICD-10-CM | POA: Diagnosis not present

## 2021-12-22 NOTE — Telephone Encounter (Signed)
Transition Care Management Unsuccessful Follow-up Telephone Call  Date of discharge and from where:  12/19/21 [facility]  Attempts:  2nd Attempt  Reason for unsuccessful TCM follow-up call:  Left voice message

## 2021-12-22 NOTE — Telephone Encounter (Signed)
LM for pt to cb 

## 2021-12-23 ENCOUNTER — Ambulatory Visit: Payer: Medicare Other | Admitting: Family

## 2021-12-23 NOTE — Telephone Encounter (Signed)
Transition Care Management Unsuccessful Follow-up Telephone Call  Date of discharge and from where:  12/19/21 [facility]  Attempts:  3rd Attempt  Reason for unsuccessful TCM follow-up call:  Left voice message. Schedule hospital follow up with PCP.

## 2021-12-24 DIAGNOSIS — N189 Chronic kidney disease, unspecified: Secondary | ICD-10-CM | POA: Diagnosis not present

## 2021-12-24 DIAGNOSIS — E559 Vitamin D deficiency, unspecified: Secondary | ICD-10-CM | POA: Diagnosis not present

## 2021-12-24 DIAGNOSIS — N179 Acute kidney failure, unspecified: Secondary | ICD-10-CM | POA: Diagnosis not present

## 2021-12-24 DIAGNOSIS — D509 Iron deficiency anemia, unspecified: Secondary | ICD-10-CM | POA: Diagnosis not present

## 2021-12-26 NOTE — Progress Notes (Signed)
[facility] Consult Note Telephone: [phone]  Fax: [phone]   Date of encounter: 12/21/2021  PATIENT NAME: April [name] [address] [place] [address]   [phone] (home)  DOB: 09-26-1940 MRN: [id number] PRIMARY CARE PROVIDER:    Einar Pheasant, MD,  [address] [place] [address] [phone]  REFERRING PROVIDER:   Einar Pheasant, [address] [place],  [place] [address] [phone]  RESPONSIBLE PARTY:    Contact Information     Name Relation Home Work April [name] [phone]  [id number]   April, [name] [phone]         I met face to face with patient in the facility. Palliative Care was asked to follow this patient by consultation request of  Dr. Frazier Richards to address advance care planning and complex medical decision making. This is the initial visit.                                     ASSESSMENT AND PLAN / RECOMMENDATIONS:   Advance Care Planning/Goals of Care: Goals include to maximize quality of life and symptom management. Patient/health care surrogate gave his/her permission to discuss.Our advance care planning conversation included a discussion about:    The value and importance of advance care planning  Experiences with loved ones who have been seriously ill or have died  Exploration of personal, cultural or spiritual beliefs that might influence medical decisions  Exploration of goals of care in the event of a sudden injury or illness  CODE STATUS: DNR  Education provided on Palliative Medicine vs. Hospice. Patient would like to continue hemodialysis; she is hoping that HD will be temporary. She is also receiving skilled therapy. Patient would like to return home once therapy is completed.   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 She states she is tolerating HD well. She is hopeful that HD will be temporary. She is to follow up with nephrology as scheduled. Recommend dietician speak with patient at facility due to her diet.   Generalized weakness-patient is to receiving OT/PT. Staff to assist with adl's as needed. She is to use walker for ambulation. Monitor for falls/safety.   Diastolic congestive heart failure-patient endorses shortness of breath since her 30's. She also has dx of COPD. She has lower extremity edema. She is to continue torsemide 40 mg three times a week; she has upcoming labs (CBC and Met B). Elevate legs when in bed or chair, compression stockings recommended. Continue daily weights.    Cirrhosis of liver-patient to continue Xifaxan and lactulose as directed.   Follow up Palliative Care Visit: Palliative care will continue to follow for complex medical decision making, advance care planning, and clarification of goals. Return  weeks or prn.   This visit was coded based on medical decision making (MDM).  PPS: 40%  HOSPICE ELIGIBILITY/DIAGNOSIS: TBD  Chief Complaint: Palliative Medicine initial consult.  HISTORY OF PRESENT ILLNESS:  April [name] is a 81 y.o. year old female  with diastolic CHF, atrial fibrillation,  COPD, CAD, OSA on CPAP, diabetes, hypertension, hyperlipidemia, thrombocytopenia. Patient hospitalized 11/23/21-12/19/2021 due to acute renal failure superimposed on CKD 4 anemia of chronic kidney disease, edema, acute hepatic and metabolic encephalopathy, cirrhosis. Patient started on hemodialysis d/t overload. She is currently receiving  HD on TTHS. Patient is not on any anticoagulation for her atrial fibrillation; she is on amiodarone.   Patient currently at [facility] on skilled nursing unit. She is to receive therapy. She denies pain. Endorses shortness of breath with exertion; she has lower extremity edema. She endorses good  appetite. She is using walker for ambulation. She reports sleeping well at night; she is fatigued on HD days. Patient has lived independently on campus prior to hospitalization. She hopes to return home after rehab.   History obtained from review of EMR, discussion with primary team, and interview with family, facility staff/caregiver and/or April [name].  I reviewed available labs, medications, imaging, studies and related documents from the EMR.  Records reviewed and summarized above.   ROS  A 10-Point ROS is negative, except for the pertinent positives/negatives detailed per the HPI.  Physical Exam: Weight: 186 pounds Pulse 88, resp 20, sats 96% Constitutional: NAD General: frail appearing, obese  EYES: anicteric sclera, lids intact, no discharge  ENMT: intact hearing, oral mucous membranes moist, dentition intact CV: S1S2, RRR, 2-3+ LE edema Pulmonary: LCTA, no increased work of breathing, no cough, room air Abdomen: normo-active BS + 4 quadrants, soft and non tender GU: deferred MSK:  moves all extremities, ambulatory Skin: warm and dry, no rashes or wounds on visible skin Neuro: + generalized weakness, A & O x 3 Psych: non-anxious affect, pleasant Hem/lymph/immuno: no widespread bruising CURRENT PROBLEM LIST:  Patient Active Problem List   Diagnosis Date Noted   Thrombocytopenia ([place]) 17/35/6701   Acute metabolic encephalopathy 41/06/129   Acute hepatic encephalopathy ([place]) 12/09/2021   Cirrhosis ([place]) 12/09/2021   Edema and ascites  12/08/2021   Hyponatremia 12/07/2021   Anemia of chronic kidney failure, stage 4 (severe) ([place]) 12/07/2021   Chronic kidney disease (CKD), stage IV (severe) (HCC) 11/29/2021   Weakness 10/29/2021   Acute respiratory failure with hypoxia ([place]) 10/18/2021   History of recurrent GI bleed on apixaban 10/17/2021   History of adverse reaction to anticoagulant medication 10/17/2021   Electrolyte abnormality 10/17/2021   Acute on chronic congestive  heart failure (HCC)    Hypokalemia 08/11/2021   Chronic diastolic CHF (congestive heart failure) (HCC) 08/08/2021   Chronic obstructive pulmonary disease, unspecified COPD type ([place]) 08/01/2021   Iron deficiency anemia    Gastric erythema    Symptomatic anemia    GI bleeding 07/07/2021   Thrombophilia ([place]) 04/10/2021   Aortic atherosclerosis ([place]) 01/03/2021   Cellulitis of leg, left 10/26/2020   Breast tenderness 07/14/2020   Abnormal liver function tests 03/11/2020   Breast pain 12/15/2018   Lymphedema 07/09/2018   Acute renal failure superimposed on stage 4 chronic kidney disease ([place]) 06/03/2018   Lower extremity edema 01/19/2018   Arthropathy of lumbar facet joint 08/08/2017   Degeneration of lumbar intervertebral disc 08/08/2017   Osteoarthritis of knee 08/08/2017   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   Paroxysmal atrial fibrillation (HCC) 03/27/2016   CAD S/P PCI pRCA Promus DES 3.5 x 16 (4.1 mm), ostRPDA Promus DES 2.5 x 12 (2.7 mm) 03/25/2016   Right knee pain 11/13/2015   Coronary artery disease involving native coronary artery of native heart with unstable angina pectoris ([place]) 06/01/2015   Diabetes mellitus ([place]) 05/16/2015   Gastritis 04/01/2015   Chest pain 04/01/2015   Hiatal hernia 08/25/2014   DOE (dyspnea on exertion) 07/15/2014   Congestion of throat 07/06/2014   Health care maintenance 07/06/2014   Fibrocystic breast disease 07/06/2014   Morbid obesity ([place]) 04/19/2014  Stress 02/16/2013   Rib pain on right side 02/16/2013   Acute on Chronic dyspnea 10/13/2012   GERD (gastroesophageal reflux disease) 10/13/2012   Essential hypertension, benign 08/18/2012   Hyperlipidemia 08/18/2012   History of colonic polyps 08/18/2012   OSA on CPAP 08/18/2012   Osteoporosis 08/18/2012   PAST MEDICAL HISTORY:  Active Ambulatory Problems    Diagnosis Date Noted   Essential hypertension, benign 08/18/2012   Hyperlipidemia 08/18/2012   History of colonic polyps 08/18/2012    OSA on CPAP 08/18/2012   Osteoporosis 08/18/2012   Acute on Chronic dyspnea 10/13/2012   GERD (gastroesophageal reflux disease) 10/13/2012   Stress 02/16/2013   Rib pain on right side 02/16/2013   Morbid obesity ([place]) 04/19/2014   Congestion of throat 07/06/2014   Health care maintenance 07/06/2014   Fibrocystic breast disease 07/06/2014   DOE (dyspnea on exertion) 07/15/2014   Hiatal hernia 08/25/2014   Gastritis 04/01/2015   Chest pain 04/01/2015   Diabetes mellitus ([place]) 05/16/2015   Coronary artery disease involving native coronary artery of native heart with unstable angina pectoris ([place]) 06/01/2015   Right knee pain 11/13/2015   CAD S/P PCI pRCA Promus DES 3.5 x 16 (4.1 mm), ostRPDA Promus DES 2.5 x 12 (2.7 mm) 03/25/2016   Paroxysmal atrial fibrillation ([place]) 03/27/2016   Lower extremity edema 01/19/2018   Acute renal failure superimposed on stage 4 chronic kidney disease ([place]) 06/03/2018   Lymphedema 07/09/2018   Breast pain 12/15/2018   Arthropathy of lumbar facet joint 08/08/2017   Degeneration of lumbar intervertebral disc 08/08/2017   Osteoarthritis of knee 08/08/2017   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   Abnormal liver function tests 03/11/2020   Breast tenderness 07/14/2020   Cellulitis of leg, left 10/26/2020   Aortic atherosclerosis ([place]) 01/03/2021   Thrombophilia ([place]) 04/10/2021   GI bleeding 07/07/2021   Symptomatic anemia    Iron deficiency anemia    Gastric erythema    Chronic obstructive pulmonary disease, unspecified COPD type ([place]) 08/01/2021   Chronic diastolic CHF (congestive heart failure) ([place]) 08/08/2021   Hypokalemia 08/11/2021   Acute on chronic congestive heart failure ([place])    History of recurrent GI bleed on apixaban 10/17/2021   History of adverse reaction to anticoagulant medication 10/17/2021   Electrolyte abnormality 10/17/2021   Acute respiratory failure with hypoxia ([place]) 10/18/2021   Weakness 10/29/2021   Chronic kidney disease (CKD), stage  IV (severe) ([place]) 11/29/2021   Hyponatremia 12/07/2021   Anemia of chronic kidney failure, stage 4 (severe) ([place]) 12/07/2021   Edema and ascites  07/68/0881   Acute metabolic encephalopathy 02/29/5944   Acute hepatic encephalopathy ([place]) 12/09/2021   Cirrhosis ([place]) 12/09/2021   Thrombocytopenia ([place]) 12/13/2021   Resolved Ambulatory Problems    Diagnosis Date Noted   ST elevation myocardial infarction (STEMI) of inferolateral wall, initial episode of care ([place]) 03/25/2016   Past Medical History:  Diagnosis Date   Anemia    CHF (congestive heart failure) (HCC)    CKD (chronic kidney disease), stage III ([place])    Diabetes mellitus without complication ([place])    Diastolic dysfunction    Diverticulitis    Dyspnea    Endometriosis    Family history of adverse reaction to anesthesia    GI bleed    History of colon polyps 08/1994   History of Migraines    Hypercholesterolemia    Hypertension    LBBB (left bundle branch block)    PAF (paroxysmal atrial fibrillation) (HCC)    Rheumatic fever  Sleep apnea    Spasmodic dysphonia    Urine incontinence    SOCIAL HX:  Social History   Tobacco Use   Smoking status: Never   Smokeless tobacco: Never  Substance Use Topics   Alcohol use: No    Alcohol/week: 0.0 standard drinks of alcohol   FAMILY HX:  Family History  Problem Relation Age of Onset   Arthritis Mother    Stroke Mother    Hypertension Mother    Osteoporosis Mother    Heart disease Father        MI   Heart attack Father    Stroke Brother    Heart attack Sister    Breast cancer Other        first cousin x 2   Stroke Daughter    Heart attack Daughter    Colon cancer Neg Hx       ALLERGIES:  Allergies  Allergen Reactions   Penicillins Other (See Comments)    Okay to take amoxicillin/(pt does not recall what the reaction to penicillin was (63-52 years old)    Sulfa Antibiotics Rash     PERTINENT MEDICATIONS:  Outpatient Encounter Medications as of  12/21/2021  Medication Sig   amiodarone (PACERONE) 200 MG tablet Take 1 tablet (200 mg total) by mouth daily.   diclofenac Sodium (VOLTAREN) 1 % GEL Apply 2 g topically 3 (three) times daily as needed (pain in legs and elbows).   docusate sodium (STOOL SOFTENER) 100 MG capsule Take 1 capsule (100 mg total) by mouth 2 (two) times daily.   lactulose (CHRONULAC) 10 GM/15ML solution Take 30 mLs (20 g total) by mouth 2 (two) times daily.   lidocaine (LIDODERM) 5 % Place 2 patches onto the skin daily. Remove & Discard patch within 12 hours or as directed by MD   multivitamin (RENA-VIT) TABS tablet Take 1 tablet by mouth at bedtime.   nitroGLYCERIN (NITROSTAT) 0.4 MG SL tablet Place 1 tablet (0.4 mg total) under the tongue every 5 (five) minutes as needed for chest pain.   Nutritional Supplements (,FEEDING SUPPLEMENT, PROSOURCE PLUS) liquid Take 30 mLs by mouth 2 (two) times daily between meals.   Nutritional Supplements (FEEDING SUPPLEMENT, NEPRO CARB STEADY,) LIQD Take 237 mLs by mouth 2 (two) times daily between meals.   pantoprazole (PROTONIX) 40 MG tablet Take 1 tablet (40 mg total) by mouth 2 (two) times daily.   rifaximin (XIFAXAN) 200 MG tablet Take 1 tablet (200 mg total) by mouth 2 (two) times daily.   torsemide 40 MG TABS Take 40 mg by mouth 3 (three) times a week.   No facility-administered encounter medications on file as of 12/21/2021.   Thank you for the opportunity to participate in the care of April [name].  The palliative care team will continue to follow. Please call our office at [phone] if we can be of additional assistance.   Ezekiel Slocumb, NP   COVID-19 PATIENT SCREENING TOOL Asked and negative response unless otherwise noted:  Have you had symptoms of covid, tested positive or been in contact with someone with symptoms/positive test in the past 5-10 days? No

## 2021-12-27 DIAGNOSIS — D509 Iron deficiency anemia, unspecified: Secondary | ICD-10-CM | POA: Diagnosis not present

## 2021-12-27 DIAGNOSIS — N179 Acute kidney failure, unspecified: Secondary | ICD-10-CM | POA: Diagnosis not present

## 2021-12-27 DIAGNOSIS — N189 Chronic kidney disease, unspecified: Secondary | ICD-10-CM | POA: Diagnosis not present

## 2021-12-27 DIAGNOSIS — E559 Vitamin D deficiency, unspecified: Secondary | ICD-10-CM | POA: Diagnosis not present

## 2021-12-28 NOTE — Telephone Encounter (Signed)
Lvm for pt to return call.

## 2021-12-29 ENCOUNTER — Telehealth: Payer: Self-pay | Admitting: Family

## 2021-12-29 DIAGNOSIS — N186 End stage renal disease: Secondary | ICD-10-CM | POA: Diagnosis not present

## 2021-12-29 DIAGNOSIS — I5022 Chronic systolic (congestive) heart failure: Secondary | ICD-10-CM | POA: Diagnosis not present

## 2021-12-29 DIAGNOSIS — E559 Vitamin D deficiency, unspecified: Secondary | ICD-10-CM | POA: Diagnosis not present

## 2021-12-29 DIAGNOSIS — Z992 Dependence on renal dialysis: Secondary | ICD-10-CM | POA: Diagnosis not present

## 2021-12-29 DIAGNOSIS — N179 Acute kidney failure, unspecified: Secondary | ICD-10-CM | POA: Diagnosis not present

## 2021-12-29 DIAGNOSIS — D509 Iron deficiency anemia, unspecified: Secondary | ICD-10-CM | POA: Diagnosis not present

## 2021-12-29 DIAGNOSIS — N189 Chronic kidney disease, unspecified: Secondary | ICD-10-CM | POA: Diagnosis not present

## 2021-12-29 DIAGNOSIS — D631 Anemia in chronic kidney disease: Secondary | ICD-10-CM | POA: Diagnosis not present

## 2021-12-29 NOTE — Telephone Encounter (Signed)
Unable to reach patient in attempt to reschedule patient for the CHF Clinic.   Philamena Kramar, NT

## 2021-12-31 DIAGNOSIS — N250 Renal osteodystrophy: Secondary | ICD-10-CM | POA: Diagnosis not present

## 2021-12-31 DIAGNOSIS — N189 Chronic kidney disease, unspecified: Secondary | ICD-10-CM | POA: Diagnosis not present

## 2021-12-31 DIAGNOSIS — N179 Acute kidney failure, unspecified: Secondary | ICD-10-CM | POA: Diagnosis not present

## 2021-12-31 DIAGNOSIS — D509 Iron deficiency anemia, unspecified: Secondary | ICD-10-CM | POA: Diagnosis not present

## 2022-01-04 ENCOUNTER — Inpatient Hospital Stay: Payer: Medicare Other | Attending: Internal Medicine | Admitting: Internal Medicine

## 2022-01-04 ENCOUNTER — Inpatient Hospital Stay: Payer: Medicare Other

## 2022-01-04 ENCOUNTER — Encounter: Payer: Self-pay | Admitting: Internal Medicine

## 2022-01-04 DIAGNOSIS — D696 Thrombocytopenia, unspecified: Secondary | ICD-10-CM | POA: Insufficient documentation

## 2022-01-04 DIAGNOSIS — K7460 Unspecified cirrhosis of liver: Secondary | ICD-10-CM | POA: Diagnosis not present

## 2022-01-04 DIAGNOSIS — N186 End stage renal disease: Secondary | ICD-10-CM | POA: Diagnosis not present

## 2022-01-04 DIAGNOSIS — Z992 Dependence on renal dialysis: Secondary | ICD-10-CM | POA: Insufficient documentation

## 2022-01-04 DIAGNOSIS — Z993 Dependence on wheelchair: Secondary | ICD-10-CM | POA: Diagnosis not present

## 2022-01-04 DIAGNOSIS — D649 Anemia, unspecified: Secondary | ICD-10-CM | POA: Insufficient documentation

## 2022-01-04 DIAGNOSIS — Z803 Family history of malignant neoplasm of breast: Secondary | ICD-10-CM | POA: Insufficient documentation

## 2022-01-04 NOTE — Assessment & Plan Note (Addendum)
#  Symptomatic anemia hemoglobin 7-8 [since March 2023]; thrombocytopenia 80s to 100 [sub-acute since AUG 2023].   #Recent review of labs from the nursing home shows hemoglobin around 8.1; platelets normal.  I suspect patient's anemia is likely chronic kidney disease/relative ron deficiency.  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  Patient currently on erythropoietin stimulating agent; iron infusions. Discussed regarding addition of multiple myeloma/kappa lambda light chain work-up with nephrology-given the low clinical suspicion for multiple myeloma. Discussed with Dr. Candiss Norse.  #Cirrhosis- ?  Nonalcoholic fatty liver versus others.  Awaiting GI evaluation.  Thank you Dr. Nicki Reaper  for allowing me to participate in the care of your pleasant patient. Please do not hesitate to contact me with questions or concerns in the interim.  # DISPOSITION: # NO labs today-  # follow up as needed- Dr.B  Addendum: Spoke with patient's daughter-in-law regarding my discussion with Dr. Candiss Norse; and the fact that the patient could follow-up with nephrology/PCP.  Patient will follow-up with [place] only as needed.   # 45 minutes face-to-face with the patient discussing the above plan of care; more than 50% of time spent on prognosis/ natural history; counseling and coordination.

## 2022-01-04 NOTE — Progress Notes (Signed)
[facility] CONSULT NOTE  Patient Care Team: Einar Pheasant, MD as PCP - General (Internal Medicine) Rockey Situ Kathlene November, MD as PCP - Cardiology (Cardiology) Vickie Epley, MD as PCP - Electrophysiology (Cardiology) Christene Lye, MD (General Surgery) Kirk Ruths, MD (Internal Medicine) Einar Pheasant, MD as Referring Physician (Internal Medicine) Minna Merritts, MD as Consulting Physician (Cardiology)  CHIEF COMPLAINTS/PURPOSE OF CONSULTATION: Anemia thrombocytopenia   HEMATOLOGY HISTORY  # PANCYTOPENIA [platelets-  WBC-  ANC-; Hb- MCV- HIV/Hepatitis: Alcohol; CT: [place]:   IMPRESSION: 1. Hepatic cirrhosis. 2. Small volume ascites. 3. Left renal cysts.     Electronically Signed   By: Ofilia Neas M.D.   On: 11/08/2021 12:08    HISTORY OF PRESENTING ILLNESS: In a wheelchair.  By daughter-in-law.  April [name] 81 y.o.  female pleasant patient was been referred to [place] for further evaluation of anemia/thrombocytopenia.  Patient was recently admitted to the hospital for congestive heart failure/hepatic encephalopathy complicated by renal failure.  Patient had per permacath placed and started on dialysis.  On admission patient noted to have hemoglobin of 7-8 needing blood transfusion.  She also noted to have low platelets in the range of 80s to 100s.  Patient also had episodes of encephalopathy related to cirrhosis/liver failure.  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  Patient overall feels poorly.  She is in a rehab.  She has limited mobility.  She is currently mostly using a walker.   Review of Systems  Constitutional:  Positive for malaise/fatigue. Negative for chills, diaphoresis, fever and weight loss.  HENT:  Negative for nosebleeds and sore throat.   Eyes:  Negative for double vision.  Respiratory:  Positive for shortness of breath. Negative for cough, hemoptysis, sputum production and wheezing.    Cardiovascular:  Positive for leg swelling. Negative for chest pain, palpitations and orthopnea.  Gastrointestinal:  Negative for abdominal pain, blood in stool, constipation, diarrhea, heartburn, melena, nausea and vomiting.  Genitourinary:  Negative for dysuria, frequency and urgency.  Musculoskeletal:  Negative for back pain and joint pain.  Skin: Negative.  Negative for itching and rash.  Neurological:  Negative for dizziness, tingling, focal weakness, weakness and headaches.  Endo/Heme/Allergies:  Does not bruise/bleed easily.  Psychiatric/Behavioral:  Negative for depression. The patient is not nervous/anxious and does not have insomnia.      MEDICAL HISTORY:  Past Medical History:  Diagnosis Date   Anemia    CAD S/P PCI pRCA Promus DES 3.5 x 16 (4.1 mm), ostRPDA Promus DES 2.5 x 12 (2.7 mm) 03/25/2016   CAD S/P percutaneous coronary angioplasty    a. 07/2015 MV: No ischemia, EF 66%;  b. 03/2016 Inflat STEMI/PCI: LM nl, LAD 25d, RI nl, LCX nl, OM1/2 nl, RCA 80p (3.5x16 Promus Premier DES), 50p/m, 30d, RPDA 90 (2.5x12 Promu Premier DES); c. 01/2018 Cath ([place], [place]): Patent RCA stents->Med Rx.   CHF (congestive heart failure) ([place])    CKD (chronic kidney disease), stage III ([place])    Diabetes mellitus without complication ([place])    Diastolic dysfunction    a. 10/2013 Echo: EF 55-65%, Gr1 DD; b. 01/2018 Echo ([place], [place]): EF 55-60%, Gr2 DD, RVSP 51mHg.   Diverticulitis    Dyspnea    Endometriosis    Family history of adverse reaction to anesthesia    Mother - had to stay in ICU because of breathing difficulties every time she had anesthesia   GERD (gastroesophageal reflux  disease)    GI bleed    a. 06/2021 - melena-->2 u PRBCs.   Hiatal hernia    History of colon polyps 08/1994   History of Migraines    Hypercholesterolemia    Hypertension    LBBB (left bundle branch block)    Osteoporosis    PAF (paroxysmal atrial fibrillation) ([place])    a.  03/2016 Dx @ time of MI; b. CHA2DS2VASc = 5-->Eliquis.   Rheumatic fever    Sleep apnea    CPAP   Spasmodic dysphonia    ST elevation myocardial infarction (STEMI) of inferolateral wall, initial episode of care ([place]) 03/25/2016   ST elevation myocardial infarction (STEMI) of inferolateral wall, initial episode of care ([place]) 03/25/2016   Urine incontinence     SURGICAL HISTORY: Past Surgical History:  Procedure Laterality Date   ABDOMINAL HYSTERECTOMY     ovaries not removed   APPENDECTOMY     was removed during hysterectomy   Breast biopsies     x2   BREAST EXCISIONAL BIOPSY Bilateral "years ago"   neg   BREAST SURGERY Bilateral    BROW LIFT Bilateral 08/15/2019   Procedure: BLEPHAROPLASTY UPPER EYELID; W/EXCESS SKIN BLEPHAROPTOSIS REPAIR; RESECT EX;  Surgeon: Karle Starch, MD;  Location: [facility];  Service: Ophthalmology;  Laterality: Bilateral;  sleep apnea   CARDIAC CATHETERIZATION  2011   moderate 40% RCA disease   CARDIAC CATHETERIZATION  01/2010   Dr. Suzie Portela : Only noted 40% RCA   CARDIAC CATHETERIZATION N/A 03/25/2016   Procedure: Left Heart Cath and Coronary Angiography;  Surgeon: Leonie Man, MD;  Location: [facility];  Service: Cardiovascular;  Laterality: N/A;   CARDIAC CATHETERIZATION N/A 03/25/2016   Procedure: Coronary Stent Intervention;  Surgeon: Leonie Man, MD;  Location: [facility];  Service: Cardiovascular;  Laterality: N/A;   COLONOSCOPY  2013   COLONOSCOPY WITH PROPOFOL N/A 07/11/2021   Procedure: COLONOSCOPY WITH PROPOFOL;  Surgeon: Lucilla Lame, MD;  Location: [facility]COPY;  Service: Endoscopy;  Laterality: N/A;   DIALYSIS/PERMA CATHETER INSERTION N/A 12/15/2021   Procedure: DIALYSIS/PERMA CATHETER INSERTION;  Surgeon: Algernon Huxley, MD;  Location: [facility];  Service: Cardiovascular;  Laterality: N/A;   ESOPHAGOGASTRODUODENOSCOPY (EGD) WITH PROPOFOL N/A 03/17/2015   Procedure: ESOPHAGOGASTRODUODENOSCOPY  (EGD) WITH PROPOFOL;  Surgeon: Christene Lye, MD;  Location: [facility] ENDOSCOPY;  Service: Gastroenterology;  Laterality: N/A;   ESOPHAGOGASTRODUODENOSCOPY (EGD) WITH PROPOFOL N/A 07/10/2021   Procedure: ESOPHAGOGASTRODUODENOSCOPY (EGD) WITH PROPOFOL;  Surgeon: Lin Landsman, MD;  Location: [facility] ENDOSCOPY;  Service: Gastroenterology;  Laterality: N/A;   GIVENS CAPSULE STUDY N/A 07/11/2021   Procedure: GIVENS CAPSULE STUDY;  Surgeon: Lucilla Lame, MD;  Location: [facility] ENDOSCOPY;  Service: Endoscopy;  Laterality: N/A;   NM MYOVIEW ([place] HX)  07/2015   No evidence ischemia or infarction. EF 66%. Low risk   TEMPORARY DIALYSIS CATHETER N/A 12/08/2021   Procedure: TEMPORARY DIALYSIS CATHETER;  Surgeon: Algernon Huxley, MD;  Location: [facility];  Service: Cardiovascular;  Laterality: N/A;   TONSILLECTOMY     TRANSTHORACIC ECHOCARDIOGRAM  10/2013   Normal LV size and function. EF 55-65%. GR 1 DD. Otherwise normal.    SOCIAL HISTORY: Social History   Socioeconomic History   Marital status: Widowed    Spouse name: Not on file   Number of children: 2   Years of education: Not on file   Highest education level: Not on file  Occupational History   Not on  file  Tobacco Use   Smoking status: Never   Smokeless tobacco: Never  Vaping Use   Vaping Use: Never used  Substance and Sexual Activity   Alcohol use: No    Alcohol/week: 0.0 standard drinks of alcohol   Drug use: No   Sexual activity: Not Currently    Birth control/protection: Post-menopausal  Other Topics Concern   Not on file  Social History Narrative   Not on file   Social Determinants of Health   Financial Resource Strain: Not on file  Food Insecurity: Not on file  Transportation Needs: Not on file  Physical Activity: Not on file  Stress: Not on file  Social Connections: Not on file  Intimate Partner Violence: Not on file    FAMILY HISTORY: Family History  Problem Relation Age of Onset   Arthritis Mother     Stroke Mother    Hypertension Mother    Osteoporosis Mother    Heart disease Father        MI   Heart attack Father    Stroke Brother    Heart attack Sister    Breast cancer Other        first cousin x 2   Stroke Daughter    Heart attack Daughter    Colon cancer Neg Hx     ALLERGIES:  is allergic to penicillins and sulfa antibiotics.  MEDICATIONS:  No current facility-administered medications for this visit.   No current outpatient medications on file.   Facility-Administered Medications Ordered in Other Visits  Medication Dose Route Frequency Provider Last Rate Last Admin   albuterol (PROVENTIL) (2.5 MG/3ML) 0.083% nebulizer solution 2.5 mg  2.5 mg Nebulization Q4H PRN Ivor Costa, MD       [START ON 01/10/2022] amiodarone (PACERONE) tablet 200 mg  200 mg Oral Daily Ivor Costa, MD       dextromethorphan-guaiFENesin ([facility] DM) 30-600 MG per 12 hr tablet 1 tablet  1 tablet Oral BID PRN Ivor Costa, MD       diclofenac Sodium (VOLTAREN) 1 % topical gel 2 g  2 g Topical TID PRN Ivor Costa, MD       docusate sodium (COLACE) capsule 100 mg  100 mg Oral BID Ivor Costa, MD       doxycycline (VIBRA-TABS) tablet 100 mg  100 mg Oral Q12H Beers, Shanon Brow, RPH       [START ON 01/10/2022] feeding supplement (PRO-STAT SUGAR FREE 64) liquid 30 mL  30 mL Oral TID WC Ivor Costa, MD       hydrALAZINE (APRESOLINE) injection 5 mg  5 mg Intravenous Q2H PRN Ivor Costa, MD       hydrocortisone cream 1 % 1 Application  1 Application Topical QID Ivor Costa, MD       ipratropium-albuterol (DUONEB) 0.5-2.5 (3) MG/3ML nebulizer solution 3 mL  3 mL Nebulization Q4H Ivor Costa, MD   3 mL at 01/09/22 2020   lactulose (CHRONULAC) 10 GM/15ML solution 20 g  20 g Oral BID Ivor Costa, MD       lidocaine (LIDODERM) 5 % 2 patch  2 patch Transdermal Q24H Ivor Costa, MD       methylPREDNISolone sodium succinate (SOLU-MEDROL) 40 mg/mL injection 40 mg  40 mg Intravenous Huntley Dec, MD       [START ON 01/10/2022]  multivitamin with minerals tablet 1 tablet  1 tablet Oral Daily Ivor Costa, MD       nitroGLYCERIN (NITROSTAT) SL tablet 0.4 mg  0.4  mg Sublingual Q5 min PRN Ivor Costa, MD       ondansetron [facility]) injection 4 mg  4 mg Intravenous Q8H PRN Ivor Costa, MD       pantoprazole (PROTONIX) EC tablet 40 mg  40 mg Oral BID Ivor Costa, MD       rifaximin Doreene Nest) tablet 200 mg  200 mg Oral BID Ivor Costa, MD       [START ON 01/11/2022] torsemide (DEMADEX) tablet 40 mg  40 mg Oral Once per day on Mon Wed Fri Niu, Xilin, MD         PHYSICAL EXAMINATION:   Vitals:   01/04/22 1411  BP: (!) 115/45  Pulse: 76  Temp: (!) 97.2 F (36.2 C)  SpO2: 99%   Filed Weights   01/04/22 1411  Weight: 184 lb (83.5 kg)    Physical Exam Vitals and nursing note reviewed.  Constitutional:      Comments:     HENT:     Head: Normocephalic and atraumatic.     Mouth/Throat:     Mouth: Mucous membranes are moist.     Pharynx: No oropharyngeal exudate.  Eyes:     Pupils: Pupils are equal, round, and reactive to light.  Cardiovascular:     Rate and Rhythm: Normal rate and regular rhythm.  Pulmonary:     Effort: No respiratory distress.     Breath sounds: No wheezing.     Comments: Decreased breath sounds bilaterally at bases.  No wheeze or crackles Abdominal:     General: Bowel sounds are normal. There is no distension.     Palpations: Abdomen is soft. There is no mass.     Tenderness: There is no abdominal tenderness. There is no guarding or rebound.  Musculoskeletal:        General: No tenderness. Normal range of motion.     Cervical back: Normal range of motion and neck supple.  Skin:    General: Skin is warm.  Neurological:     Mental Status: She is alert and oriented to person, place, and time.  Psychiatric:        Mood and Affect: Affect normal.        Judgment: Judgment normal.      LABORATORY DATA:  I have reviewed the data as listed Lab Results  Component Value Date   WBC 13.5 (H)  01/09/2022   HGB 7.2 (L) 01/09/2022   HCT 23.1 (L) 01/09/2022   MCV 101.8 (H) 01/09/2022   PLT 162 01/09/2022   Recent Labs    08/09/21 0430 08/10/21 0520 10/13/21 1113 10/17/21 1534 10/27/21 1301 11/07/21 1501 11/17/21 1546 11/23/21 1514 11/29/21 2329 11/30/21 0501 12/11/21 0434 12/12/21 0540 12/14/21 0517 12/17/21 1929 12/19/21 0910 01/09/22 1516  NA 138   < >  --    < > 138   < > 137   < > 132*   < > 134*   < > 133* 133* 132* 136  K 3.6   < >  --    < > 4.2   < > 4.5   < > 4.5   < > 3.4*   < > 3.2* 3.3* 3.5 3.7  CL 105   < >  --    < > 99   < > 100   < > 97*   < > 100   < > 101 98 98 103  CO2 24   < >  --    < > 26   < >  24   < > 26   < > 28   < > _0 GLUCOSE 110*   < >  --    < > 106*   < > 139*   < > 136*   < > 109*   < > 100* 97 115* 132*  BUN 29*   < >  --    < > 40*   < > 23   < > 41*   < > 32*   < > 42* 27* 35* 72*  CREATININE 1.79*   < >  --    < > 2.08*   < > 2.07*   < > 2.13*   < > 2.57*   < > 2.37* 1.84* 2.87* 4.93*  CALCIUM 8.2*   < >  --    < > 9.3   < > 8.8   < > 8.5*   < > 8.0*   < > 8.2* 7.5* 8.3* 9.2  GFRNONAA 28*   < >  --    < > 23*   < >  --    < > 23*   < > 18*   < > 20* 27* 16* 8*  PROT 5.5*   < > 6.2   < > 6.6   < > 6.1  --  6.2*  --  5.4*  --   --   --   --   --   ALBUMIN 2.4*   < > 2.9*   < > 2.8*   < > 2.8*  --  2.4*  --  2.3*   < > 2.1* 2.1* 2.2*  --   AST 47*   < > 70*   < > 92*   < > 73*  --  76*  --  93*  --   --   --   --   --   ALT 25   < > 36*   < > 45*   < > 34  --  32  --  36  --   --   --   --   --   ALKPHOS 75   < > 125*   < > 127*   < > 127*  --  99  --  80  --   --   --   --   --   BILITOT 0.7   < > 1.4*   < > 1.8*   < > 1.7*  --  1.7*  --  1.4*  --   --   --   --   --   BILIDIR 0.1   < > 0.4*  --  0.6*  --  0.6*  --   --   --   --   --   --   --   --   --   IBILI 0.6  --   --   --  1.2*  --   --   --   --   --   --   --   --   --   --   --    < > = values in this interval not displayed.     DG Chest 2 View  Result Date:  01/09/2022 CLINICAL DATA:  Chest pain, shortness of breath EXAM: CHEST - 2 VIEW COMPARISON:  Previous studies including the examination of 12/09/2021 FINDINGS: Transverse diameter of heart is increased. There is soft tissue fullness in  the retrocardiac region extending to the medial right lower lung field, most likely large fixed hiatal hernia. There are no signs of alveolar pulmonary edema or focal pulmonary consolidation. There is blunting of both posterior costophrenic angles. There is no pneumothorax. There is interval placement of dialysis catheter with its tip at the junction of superior vena cava and right atrium. IMPRESSION: There are no signs of pulmonary edema are focal pulmonary consolidation. Small bilateral pleural effusions. Fixed hiatal hernia. Electronically Signed   By: Elmer Picker M.D.   On: 01/09/2022 16:26   US Abdomen Limited  Result Date: 12/17/2021 CLINICAL DATA:  Assess for ascites. EXAM: LIMITED ABDOMEN ULTRASOUND FOR ASCITES TECHNIQUE: Limited ultrasound survey for ascites was performed in all four abdominal quadrants. COMPARISON:  12/12/2021. FINDINGS: Small amount of ascites noted in all 4 quadrants similar to the recent prior ultrasound. IMPRESSION: 1. Small amount of ascites without significant change from the study dated 12/12/2021. Electronically Signed   By: Lajean Manes M.D.   On: 12/17/2021 15:30   PERIPHERAL VASCULAR CATHETERIZATION  Result Date: 12/15/2021 See surgical note for result.  US Abdomen Limited  Result Date: 12/12/2021 CLINICAL DATA:  Abdominal distension and cirrhosis. EXAM: LIMITED ABDOMEN ULTRASOUND FOR ASCITES TECHNIQUE: Limited ultrasound survey for ascites was performed in all four abdominal quadrants. COMPARISON:  11/08/2021.  CT 11/29/2020 FINDINGS: Small volume ascites identified within all 4 quadrants. IMPRESSION: Small volume abdominal ascites. Electronically Signed   By: Abigail Miyamoto M.D.   On: 12/12/2021 13:41    ASSESSMENT & PLAN:    Symptomatic anemia # Symptomatic anemia hemoglobin 7-8 [since March 2023]; thrombocytopenia 80s to 100 [sub-acute since AUG 2023].   #Recent review of labs from the nursing home shows hemoglobin around 8.1; platelets normal.  I suspect patient's anemia is likely chronic kidney disease/relative ron deficiency.  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  Patient currently on erythropoietin stimulating agent; iron infusions. Discussed regarding addition of multiple myeloma/kappa lambda light chain work-up with nephrology-given the low clinical suspicion for multiple myeloma. Discussed with Dr. Candiss Norse.  #Cirrhosis- ?  Nonalcoholic fatty liver versus others.  Awaiting GI evaluation.  Thank you Dr. Nicki Reaper  for allowing me to participate in the care of your pleasant patient. Please do not hesitate to contact me with questions or concerns in the interim.  # DISPOSITION: # NO labs today-  # follow up as needed- Dr.B  Addendum: Spoke with patient's daughter-in-law regarding my discussion with Dr. Candiss Norse; and the fact that the patient could follow-up with nephrology/PCP.  Patient will follow-up with [place] only as needed.   # 45 minutes face-to-face with the patient discussing the above plan of care; more than 50% of time spent on prognosis/ natural history; counseling and coordination.     All questions were answered. The patient knows to call the clinic with any problems, questions or concerns.    Cammie Sickle, MD 01/09/2022 9:30 PM

## 2022-01-04 NOTE — Progress Notes (Signed)
Would like to know more about her health issue and possible transfusion.

## 2022-01-05 DIAGNOSIS — N250 Renal osteodystrophy: Secondary | ICD-10-CM | POA: Diagnosis not present

## 2022-01-05 DIAGNOSIS — N179 Acute kidney failure, unspecified: Secondary | ICD-10-CM | POA: Diagnosis not present

## 2022-01-05 DIAGNOSIS — D509 Iron deficiency anemia, unspecified: Secondary | ICD-10-CM | POA: Diagnosis not present

## 2022-01-05 DIAGNOSIS — N189 Chronic kidney disease, unspecified: Secondary | ICD-10-CM | POA: Diagnosis not present

## 2022-01-06 ENCOUNTER — Ambulatory Visit: Payer: Medicare Other | Admitting: Internal Medicine

## 2022-01-07 DIAGNOSIS — N179 Acute kidney failure, unspecified: Secondary | ICD-10-CM | POA: Diagnosis not present

## 2022-01-07 DIAGNOSIS — N250 Renal osteodystrophy: Secondary | ICD-10-CM | POA: Diagnosis not present

## 2022-01-07 DIAGNOSIS — N189 Chronic kidney disease, unspecified: Secondary | ICD-10-CM | POA: Diagnosis not present

## 2022-01-07 DIAGNOSIS — D509 Iron deficiency anemia, unspecified: Secondary | ICD-10-CM | POA: Diagnosis not present

## 2022-01-09 ENCOUNTER — Emergency Department: Payer: Medicare Other

## 2022-01-09 ENCOUNTER — Other Ambulatory Visit: Payer: Self-pay

## 2022-01-09 ENCOUNTER — Inpatient Hospital Stay
Admission: EM | Admit: 2022-01-09 | Discharge: 2022-01-13 | DRG: 190 | Disposition: A | Discharge status: Skilled Nursing Facility | Payer: Medicare Other | Source: Skilled Nursing Facility | Attending: Internal Medicine | Admitting: Internal Medicine

## 2022-01-09 DIAGNOSIS — R0789 Other chest pain: Secondary | ICD-10-CM | POA: Diagnosis not present

## 2022-01-09 DIAGNOSIS — D696 Thrombocytopenia, unspecified: Secondary | ICD-10-CM | POA: Diagnosis not present

## 2022-01-09 DIAGNOSIS — Z9861 Coronary angioplasty status: Secondary | ICD-10-CM

## 2022-01-09 DIAGNOSIS — Z634 Disappearance and death of family member: Secondary | ICD-10-CM

## 2022-01-09 DIAGNOSIS — R600 Localized edema: Secondary | ICD-10-CM | POA: Diagnosis not present

## 2022-01-09 DIAGNOSIS — D72829 Elevated white blood cell count, unspecified: Secondary | ICD-10-CM | POA: Diagnosis not present

## 2022-01-09 DIAGNOSIS — R188 Other ascites: Secondary | ICD-10-CM | POA: Diagnosis present

## 2022-01-09 DIAGNOSIS — Z4901 Encounter for fitting and adjustment of extracorporeal dialysis catheter: Secondary | ICD-10-CM | POA: Diagnosis not present

## 2022-01-09 DIAGNOSIS — N186 End stage renal disease: Secondary | ICD-10-CM | POA: Diagnosis not present

## 2022-01-09 DIAGNOSIS — R2689 Other abnormalities of gait and mobility: Secondary | ICD-10-CM | POA: Diagnosis not present

## 2022-01-09 DIAGNOSIS — J9601 Acute respiratory failure with hypoxia: Secondary | ICD-10-CM | POA: Diagnosis present

## 2022-01-09 DIAGNOSIS — D649 Anemia, unspecified: Secondary | ICD-10-CM | POA: Diagnosis not present

## 2022-01-09 DIAGNOSIS — I2693 Single subsegmental pulmonary embolism without acute cor pulmonale: Secondary | ICD-10-CM | POA: Diagnosis not present

## 2022-01-09 DIAGNOSIS — K449 Diaphragmatic hernia without obstruction or gangrene: Secondary | ICD-10-CM | POA: Diagnosis not present

## 2022-01-09 DIAGNOSIS — K922 Gastrointestinal hemorrhage, unspecified: Secondary | ICD-10-CM | POA: Diagnosis not present

## 2022-01-09 DIAGNOSIS — I10 Essential (primary) hypertension: Secondary | ICD-10-CM | POA: Diagnosis not present

## 2022-01-09 DIAGNOSIS — I482 Chronic atrial fibrillation, unspecified: Secondary | ICD-10-CM | POA: Diagnosis not present

## 2022-01-09 DIAGNOSIS — R0600 Dyspnea, unspecified: Secondary | ICD-10-CM | POA: Diagnosis not present

## 2022-01-09 DIAGNOSIS — K7682 Hepatic encephalopathy: Secondary | ICD-10-CM | POA: Diagnosis not present

## 2022-01-09 DIAGNOSIS — I2699 Other pulmonary embolism without acute cor pulmonale: Secondary | ICD-10-CM | POA: Diagnosis present

## 2022-01-09 DIAGNOSIS — Z515 Encounter for palliative care: Secondary | ICD-10-CM

## 2022-01-09 DIAGNOSIS — I5032 Chronic diastolic (congestive) heart failure: Secondary | ICD-10-CM | POA: Diagnosis not present

## 2022-01-09 DIAGNOSIS — I132 Hypertensive heart and chronic kidney disease with heart failure and with stage 5 chronic kidney disease, or end stage renal disease: Secondary | ICD-10-CM | POA: Diagnosis present

## 2022-01-09 DIAGNOSIS — M810 Age-related osteoporosis without current pathological fracture: Secondary | ICD-10-CM | POA: Diagnosis present

## 2022-01-09 DIAGNOSIS — Z9989 Dependence on other enabling machines and devices: Secondary | ICD-10-CM | POA: Diagnosis not present

## 2022-01-09 DIAGNOSIS — E669 Obesity, unspecified: Secondary | ICD-10-CM | POA: Diagnosis present

## 2022-01-09 DIAGNOSIS — I480 Paroxysmal atrial fibrillation: Secondary | ICD-10-CM | POA: Diagnosis present

## 2022-01-09 DIAGNOSIS — A419 Sepsis, unspecified organism: Secondary | ICD-10-CM | POA: Diagnosis present

## 2022-01-09 DIAGNOSIS — Z992 Dependence on renal dialysis: Secondary | ICD-10-CM

## 2022-01-09 DIAGNOSIS — Z8249 Family history of ischemic heart disease and other diseases of the circulatory system: Secondary | ICD-10-CM

## 2022-01-09 DIAGNOSIS — Z66 Do not resuscitate: Secondary | ICD-10-CM | POA: Diagnosis present

## 2022-01-09 DIAGNOSIS — R7989 Other specified abnormal findings of blood chemistry: Secondary | ICD-10-CM | POA: Diagnosis not present

## 2022-01-09 DIAGNOSIS — Z86010 Personal history of colonic polyps: Secondary | ICD-10-CM | POA: Diagnosis not present

## 2022-01-09 DIAGNOSIS — G4730 Sleep apnea, unspecified: Secondary | ICD-10-CM | POA: Diagnosis present

## 2022-01-09 DIAGNOSIS — Z79899 Other long term (current) drug therapy: Secondary | ICD-10-CM

## 2022-01-09 DIAGNOSIS — G4733 Obstructive sleep apnea (adult) (pediatric): Secondary | ICD-10-CM | POA: Diagnosis not present

## 2022-01-09 DIAGNOSIS — N2581 Secondary hyperparathyroidism of renal origin: Secondary | ICD-10-CM | POA: Diagnosis not present

## 2022-01-09 DIAGNOSIS — K7460 Unspecified cirrhosis of liver: Secondary | ICD-10-CM | POA: Diagnosis not present

## 2022-01-09 DIAGNOSIS — Z20822 Contact with and (suspected) exposure to covid-19: Secondary | ICD-10-CM | POA: Diagnosis present

## 2022-01-09 DIAGNOSIS — I447 Left bundle-branch block, unspecified: Secondary | ICD-10-CM | POA: Diagnosis present

## 2022-01-09 DIAGNOSIS — K219 Gastro-esophageal reflux disease without esophagitis: Secondary | ICD-10-CM | POA: Diagnosis not present

## 2022-01-09 DIAGNOSIS — J90 Pleural effusion, not elsewhere classified: Secondary | ICD-10-CM | POA: Diagnosis not present

## 2022-01-09 DIAGNOSIS — J449 Chronic obstructive pulmonary disease, unspecified: Secondary | ICD-10-CM | POA: Diagnosis not present

## 2022-01-09 DIAGNOSIS — E785 Hyperlipidemia, unspecified: Secondary | ICD-10-CM | POA: Diagnosis present

## 2022-01-09 DIAGNOSIS — J441 Chronic obstructive pulmonary disease with (acute) exacerbation: Secondary | ICD-10-CM | POA: Diagnosis not present

## 2022-01-09 DIAGNOSIS — R2681 Unsteadiness on feet: Secondary | ICD-10-CM | POA: Diagnosis not present

## 2022-01-09 DIAGNOSIS — D631 Anemia in chronic kidney disease: Secondary | ICD-10-CM | POA: Diagnosis not present

## 2022-01-09 DIAGNOSIS — E7800 Pure hypercholesterolemia, unspecified: Secondary | ICD-10-CM | POA: Diagnosis present

## 2022-01-09 DIAGNOSIS — I5030 Unspecified diastolic (congestive) heart failure: Secondary | ICD-10-CM | POA: Diagnosis not present

## 2022-01-09 DIAGNOSIS — Z6833 Body mass index (BMI) 33.0-33.9, adult: Secondary | ICD-10-CM

## 2022-01-09 DIAGNOSIS — R531 Weakness: Secondary | ICD-10-CM | POA: Diagnosis not present

## 2022-01-09 DIAGNOSIS — Z7189 Other specified counseling: Secondary | ICD-10-CM | POA: Diagnosis not present

## 2022-01-09 DIAGNOSIS — I2510 Atherosclerotic heart disease of native coronary artery without angina pectoris: Secondary | ICD-10-CM | POA: Diagnosis present

## 2022-01-09 DIAGNOSIS — E1122 Type 2 diabetes mellitus with diabetic chronic kidney disease: Secondary | ICD-10-CM | POA: Diagnosis present

## 2022-01-09 DIAGNOSIS — R079 Chest pain, unspecified: Secondary | ICD-10-CM | POA: Diagnosis not present

## 2022-01-09 DIAGNOSIS — I5022 Chronic systolic (congestive) heart failure: Secondary | ICD-10-CM | POA: Diagnosis not present

## 2022-01-09 DIAGNOSIS — J3800 Paralysis of vocal cords and larynx, unspecified: Secondary | ICD-10-CM | POA: Diagnosis not present

## 2022-01-09 DIAGNOSIS — R0602 Shortness of breath: Secondary | ICD-10-CM | POA: Diagnosis not present

## 2022-01-09 DIAGNOSIS — M6281 Muscle weakness (generalized): Secondary | ICD-10-CM | POA: Diagnosis not present

## 2022-01-09 LAB — BASIC METABOLIC PANEL
Anion gap: 11 (ref 5–15)
BUN: 72 mg/dL — ABNORMAL HIGH (ref 8–23)
CO2: 22 mmol/L (ref 22–32)
Calcium: 9.2 mg/dL (ref 8.9–10.3)
Chloride: 103 mmol/L (ref 98–111)
Creatinine, Ser: 4.93 mg/dL — ABNORMAL HIGH (ref 0.44–1.00)
GFR, Estimated: 8 mL/min — ABNORMAL LOW (ref >60)
Glucose, Bld: 132 mg/dL — ABNORMAL HIGH (ref 70–99)
Potassium: 3.7 mmol/L (ref 3.5–5.1)
Sodium: 136 mmol/L (ref 135–145)

## 2022-01-09 LAB — LACTIC ACID, PLASMA: Lactic Acid, Venous: 2.6 mmol/L (ref 0.5–1.9)

## 2022-01-09 LAB — CBC
HCT: 23.1 % — ABNORMAL LOW (ref 36.0–46.0)
Hemoglobin: 7.2 g/dL — ABNORMAL LOW (ref 12.0–15.0)
MCH: 31.7 pg (ref 26.0–34.0)
MCHC: 31.2 g/dL (ref 30.0–36.0)
MCV: 101.8 fL — ABNORMAL HIGH (ref 80.0–100.0)
Platelets: 162 10*3/uL (ref 150–400)
RBC: 2.27 MIL/uL — ABNORMAL LOW (ref 3.87–5.11)
RDW: 22.2 % — ABNORMAL HIGH (ref 11.5–15.5)
WBC: 13.5 10*3/uL — ABNORMAL HIGH (ref 4.0–10.5)
nRBC: 0 % (ref 0.0–0.2)

## 2022-01-09 LAB — AMMONIA: Ammonia: 38 umol/L — ABNORMAL HIGH (ref 9–35)

## 2022-01-09 LAB — TROPONIN I (HIGH SENSITIVITY)
Troponin I (High Sensitivity): 22 ng/L — ABNORMAL HIGH (ref <18)
Troponin I (High Sensitivity): 25 ng/L — ABNORMAL HIGH (ref <18)

## 2022-01-09 LAB — PROCALCITONIN: Procalcitonin: 0.56 ng/mL

## 2022-01-09 LAB — SARS CORONAVIRUS 2 BY RT PCR: SARS Coronavirus 2 by RT PCR: NEGATIVE

## 2022-01-09 LAB — FOLATE: Folate: 7 ng/mL (ref >5.9)

## 2022-01-09 LAB — RETICULOCYTES
Immature Retic Fract: 34.6 % — ABNORMAL HIGH (ref 2.3–15.9)
RBC.: 2.29 MIL/uL — ABNORMAL LOW (ref 3.87–5.11)
Retic Count, Absolute: 136.5 10*3/uL (ref 19.0–186.0)
Retic Ct Pct: 6 % — ABNORMAL HIGH (ref 0.4–3.1)

## 2022-01-09 LAB — APTT: aPTT: 38 seconds — ABNORMAL HIGH (ref 24–36)

## 2022-01-09 LAB — PREPARE RBC (CROSSMATCH)

## 2022-01-09 LAB — FERRITIN: Ferritin: 101 ng/mL (ref 11–307)

## 2022-01-09 LAB — PROTIME-INR
INR: 1.3 — ABNORMAL HIGH (ref 0.8–1.2)
Prothrombin Time: 15.8 seconds — ABNORMAL HIGH (ref 11.4–15.2)

## 2022-01-09 LAB — IRON AND TIBC
Iron: 38 ug/dL (ref 28–170)
Saturation Ratios: 14 % (ref 10.4–31.8)
TIBC: 270 ug/dL (ref 250–450)
UIBC: 232 ug/dL

## 2022-01-09 MED ORDER — LIDOCAINE 5 % EX PTCH
2.0000 | MEDICATED_PATCH | CUTANEOUS | Status: DC
  Administered 2022-01-09: 2 via TRANSDERMAL
  Filled 2022-01-09 (×2): qty 2

## 2022-01-09 MED ORDER — LACTULOSE 10 GM/15ML PO SOLN
20.0000 g | Freq: Two times a day (BID) | ORAL | Status: DC
  Administered 2022-01-09 – 2022-01-12 (×4): 20 g via ORAL
  Filled 2022-01-09 (×6): qty 30

## 2022-01-09 MED ORDER — ADULT MULTIVITAMIN W/MINERALS CH
1.0000 | ORAL_TABLET | Freq: Every day | ORAL | Status: DC
  Administered 2022-01-12 – 2022-01-13 (×2): 1 via ORAL
  Filled 2022-01-09 (×2): qty 1

## 2022-01-09 MED ORDER — DOXYCYCLINE HYCLATE 100 MG PO TABS
100.0000 mg | ORAL_TABLET | Freq: Two times a day (BID) | ORAL | Status: DC
  Administered 2022-01-10 – 2022-01-13 (×7): 100 mg via ORAL
  Filled 2022-01-09 (×6): qty 1

## 2022-01-09 MED ORDER — IPRATROPIUM-ALBUTEROL 0.5-2.5 (3) MG/3ML IN SOLN
3.0000 mL | RESPIRATORY_TRACT | Status: DC
  Administered 2022-01-09 – 2022-01-10 (×3): 3 mL via RESPIRATORY_TRACT
  Filled 2022-01-09 (×3): qty 3

## 2022-01-09 MED ORDER — RIFAXIMIN 200 MG PO TABS
200.0000 mg | ORAL_TABLET | Freq: Two times a day (BID) | ORAL | Status: DC
  Administered 2022-01-09 – 2022-01-13 (×6): 200 mg via ORAL
  Filled 2022-01-09 (×8): qty 1

## 2022-01-09 MED ORDER — METHYLPREDNISOLONE SODIUM SUCC 40 MG IJ SOLR
40.0000 mg | Freq: Two times a day (BID) | INTRAMUSCULAR | Status: DC
  Administered 2022-01-09 – 2022-01-10 (×2): 40 mg via INTRAVENOUS
  Filled 2022-01-09: qty 1

## 2022-01-09 MED ORDER — HYDRALAZINE HCL 20 MG/ML IJ SOLN: 5.0000 mg | INTRAMUSCULAR | Status: DC | PRN

## 2022-01-09 MED ORDER — ONDANSETRON HCL 4 MG/2ML IJ SOLN
4.0000 mg | Freq: Three times a day (TID) | INTRAMUSCULAR | Status: DC | PRN
  Administered 2022-01-10: 4 mg via INTRAVENOUS
  Filled 2022-01-09: qty 2

## 2022-01-09 MED ORDER — TORSEMIDE 20 MG PO TABS
40.0000 mg | ORAL_TABLET | ORAL | Status: DC
  Administered 2022-01-11 – 2022-01-13 (×2): 40 mg via ORAL
  Filled 2022-01-09 (×2): qty 2

## 2022-01-09 MED ORDER — STERILE WATER FOR INJECTION IJ SOLN
INTRAMUSCULAR | Status: AC
  Administered 2022-01-10: 10 mL
  Filled 2022-01-09: qty 10

## 2022-01-09 MED ORDER — DOCUSATE SODIUM 100 MG PO CAPS
100.0000 mg | ORAL_CAPSULE | Freq: Two times a day (BID) | ORAL | Status: DC
  Administered 2022-01-09 – 2022-01-13 (×5): 100 mg via ORAL
  Filled 2022-01-09 (×5): qty 1

## 2022-01-09 MED ORDER — NITROGLYCERIN 0.4 MG SL SUBL: 0.4000 mg | SUBLINGUAL_TABLET | SUBLINGUAL | Status: DC | PRN

## 2022-01-09 MED ORDER — ALBUTEROL SULFATE (2.5 MG/3ML) 0.083% IN NEBU: 2.5000 mg | INHALATION_SOLUTION | RESPIRATORY_TRACT | Status: DC | PRN

## 2022-01-09 MED ORDER — SODIUM CHLORIDE 0.9 % IV SOLN
10.0000 mL/h | Freq: Once | INTRAVENOUS | Status: AC
  Administered 2022-01-09: 10 mL/h via INTRAVENOUS

## 2022-01-09 MED ORDER — PRO-STAT SUGAR FREE PO LIQD: 30.0000 mL | Freq: Three times a day (TID) | ORAL | Status: DC

## 2022-01-09 MED ORDER — AMIODARONE HCL 200 MG PO TABS
200.0000 mg | ORAL_TABLET | Freq: Every day | ORAL | Status: DC
  Administered 2022-01-10 – 2022-01-13 (×4): 200 mg via ORAL
  Filled 2022-01-09 (×4): qty 1

## 2022-01-09 MED ORDER — HYDROCORTISONE 1 % EX CREA
1.0000 | TOPICAL_CREAM | Freq: Four times a day (QID) | CUTANEOUS | Status: DC
  Administered 2022-01-09 – 2022-01-13 (×4): 1 via TOPICAL
  Filled 2022-01-09 (×2): qty 28

## 2022-01-09 MED ORDER — DICLOFENAC SODIUM 1 % EX GEL: 2.0000 g | Freq: Three times a day (TID) | CUTANEOUS | Status: DC | PRN

## 2022-01-09 MED ORDER — AZITHROMYCIN 250 MG PO TABS: 250.0000 mg | ORAL_TABLET | Freq: Every day | ORAL | Status: DC

## 2022-01-09 MED ORDER — DM-GUAIFENESIN ER 30-600 MG PO TB12
1.0000 | ORAL_TABLET | Freq: Two times a day (BID) | ORAL | Status: DC | PRN
  Administered 2022-01-09: 1 via ORAL
  Filled 2022-01-09: qty 1

## 2022-01-09 MED ORDER — PANTOPRAZOLE SODIUM 40 MG PO TBEC
40.0000 mg | DELAYED_RELEASE_TABLET | Freq: Two times a day (BID) | ORAL | Status: DC
  Administered 2022-01-09 – 2022-01-13 (×6): 40 mg via ORAL
  Filled 2022-01-09 (×6): qty 1

## 2022-01-09 MED ORDER — AZITHROMYCIN 500 MG PO TABS: 500.0000 mg | ORAL_TABLET | Freq: Every day | ORAL | Status: DC

## 2022-01-09 NOTE — Assessment & Plan Note (Signed)
Patient has cough, wheezing on auscultation, indicating COPD exacerbation.  No oxygen desaturation.  Chest x-ray is negative for infiltration or pulmonary edema. Wheezing improved.  COVID 19 PCR negative. -Continue with steroid. -Continue with bronchodilators and supportive care.

## 2022-01-09 NOTE — Assessment & Plan Note (Signed)
Troponin level 22, denies chest pain, likely nonspecific or decreased clearance secondary to ESRD. -As needed nitroglycerin

## 2022-01-09 NOTE — Assessment & Plan Note (Signed)
Heart rate 75.  Patient is not taking anticoagulants due to GI bleeding -Telemetry monitoring -Amiodarone

## 2022-01-09 NOTE — Assessment & Plan Note (Signed)
-   Transfuse 1 unit of blood

## 2022-01-09 NOTE — ED Triage Notes (Signed)
First Nurse Note:  Pt via EMS from [facility], pt c/o CP this morning. They sent blood work and hgb of 6.9. Pt endorses SOB. Denies pain now. Pt is A&Ox4 and NAD  150/80 99% on RA 78 HR

## 2022-01-09 NOTE — Assessment & Plan Note (Signed)
No abdominal pain.  INR 1.2 -Check  PTT -Continue rifaximin and Lasix -Continue lactulose -Check ammonia level

## 2022-01-09 NOTE — Progress Notes (Signed)
Patient on room air in no distress. Patient endorses she does wear cpap at home and usually brings home unit if admitted to hospital. Patient states however, she does not wear every night and would be fine without using hospital unit as she does not anticipate staying here long.

## 2022-01-09 NOTE — Assessment & Plan Note (Signed)
-   Consulted Dr. Candiss Norse of renal for dialysis

## 2022-01-09 NOTE — Assessment & Plan Note (Signed)
-   IV hydralazine as needed -Patient has on torsemide

## 2022-01-09 NOTE — H&P (Signed)
History and Physical    April [name] WUJ:[id number] DOB: 27-Apr-1941 DOA: 01/09/2022  Referring MD/NP/PA:   PCP: Einar Pheasant, MD   Patient coming from:  The patient is coming from SNF  Chief Complaint: Shortness of breath  HPI: April [name] is a 81 y.o. female with medical history significant of ESRD-HD (TTS), hypertension, hyperlipidemia, COPD, GERD, CAD, STEMI, DES stent placement, PAF not on anticoagulants due to GI bleeding, liver cirrhosis, left bundle blockade, migraine, dCHF, OSA on CPAP, who presents with shortness breath.  Patient states that she has worsening shortness breath in the past several days.  Patient has cough with little mucus production.  She has wheezing, no chest pain, fever or chills.  Denies nausea, vomiting, diarrhea or abdominal pain.  No symptoms of UTI.  She had been doing dialysis consistently.  Last dialysis was on Saturday.  Per report, patient was found to have low hemoglobin 6.9 in SNF.  Her hemoglobin is 7.2 in ED today.  Her recent baseline hemoglobin 8.5 on 12/19/2021.  Patient denies dark stool or rectal bleeding.  Data reviewed independently and ED Course: pt was found to have WBC 13.5, INR 1.2, troponin level 22, pending COVID PCR, potassium 3.7, bicarbonate 22, creatinine 4.93, BUN 72.  Temperature normal, blood pressure 140/56, heart rate 75, RR 31, oxygen saturation 98% on room air.  Chest x-ray is negative for infiltration, no pulmonary edema.  Patient is placed on telemetry bed for observation.  Dr. Candiss Norse of renal was consulted.   EKG: I have personally reviewed.  Atrial fibrillation, QTc 497, left bundle blockade, poor R wave progression.   Review of Systems:   General: no fevers, chills, no body weight gain, has fatigue HEENT: no blurry vision, hearing changes or sore throat Respiratory: has dyspnea, coughing, wheezing CV: no chest pain, no palpitations GI: no nausea, vomiting, abdominal pain, diarrhea, constipation GU: no dysuria,  burning on urination, increased urinary frequency, hematuria  Ext: has leg edema Neuro: no unilateral weakness, numbness, or tingling, no vision change or hearing loss Skin: no rash, no skin tear. MSK: No muscle spasm, no deformity, no limitation of range of movement in spin Heme: No easy bruising.  Travel history: No recent long distant travel.   Allergy:  Allergies  Allergen Reactions   Penicillins Other (See Comments)    Okay to take amoxicillin/(pt does not recall what the reaction to penicillin was (38-3 years old)    Sulfa Antibiotics Rash    Past Medical History:  Diagnosis Date   Anemia    CAD S/P PCI pRCA Promus DES 3.5 x 16 (4.1 mm), ostRPDA Promus DES 2.5 x 12 (2.7 mm) 03/25/2016   CAD S/P percutaneous coronary angioplasty    a. 07/2015 MV: No ischemia, EF 66%;  b. 03/2016 Inflat STEMI/PCI: LM nl, LAD 25d, RI nl, LCX nl, OM1/2 nl, RCA 80p (3.5x16 Promus Premier DES), 50p/m, 30d, RPDA 90 (2.5x12 Promu Premier DES); c. 01/2018 Cath ([place], [place]): Patent RCA stents->Med Rx.   CHF (congestive heart failure) ([place])    CKD (chronic kidney disease), stage III ([place])    Diabetes mellitus without complication ([place])    Diastolic dysfunction    a. 10/2013 Echo: EF 55-65%, Gr1 DD; b. 01/2018 Echo ([place], [place]): EF 55-60%, Gr2 DD, RVSP 28mHg.   Diverticulitis    Dyspnea    Endometriosis    Family history of adverse reaction to anesthesia    Mother - had to stay in ICU  because of breathing difficulties every time she had anesthesia   GERD (gastroesophageal reflux disease)    GI bleed    a. 06/2021 - melena-->2 u PRBCs.   Hiatal hernia    History of colon polyps 08/1994   History of Migraines    Hypercholesterolemia    Hypertension    LBBB (left bundle branch block)    Osteoporosis    PAF (paroxysmal atrial fibrillation) ([place])    a. 03/2016 Dx @ time of MI; b. CHA2DS2VASc = 5-->Eliquis.   Rheumatic fever    Sleep apnea    CPAP   Spasmodic  dysphonia    ST elevation myocardial infarction (STEMI) of inferolateral wall, initial episode of care ([place]) 03/25/2016   ST elevation myocardial infarction (STEMI) of inferolateral wall, initial episode of care [facility]) 03/25/2016   Urine incontinence     Past Surgical History:  Procedure Laterality Date   ABDOMINAL HYSTERECTOMY     ovaries not removed   APPENDECTOMY     was removed during hysterectomy   Breast biopsies     x2   BREAST EXCISIONAL BIOPSY Bilateral "years ago"   neg   BREAST SURGERY Bilateral    BROW LIFT Bilateral 08/15/2019   Procedure: BLEPHAROPLASTY UPPER EYELID; W/EXCESS SKIN BLEPHAROPTOSIS REPAIR; RESECT EX;  Surgeon: Karle Starch, MD;  Location: [place];  Service: Ophthalmology;  Laterality: Bilateral;  sleep apnea   CARDIAC CATHETERIZATION  2011   moderate 40% RCA disease   CARDIAC CATHETERIZATION  01/2010   Dr. Gollan'@[facility]'$ : Only noted 40% RCA   CARDIAC CATHETERIZATION N/A 03/25/2016   Procedure: Left Heart Cath and Coronary Angiography;  Surgeon: 04/07/2016, MD;  Location: [place] CV LAB;  Service: Cardiovascular;  Laterality: N/A;   CARDIAC CATHETERIZATION N/A 03/25/2016   Procedure: Coronary Stent Intervention;  Surgeon: 04/07/2016, MD;  Location: [place] CV LAB;  Service: Cardiovascular;  Laterality: N/A;   COLONOSCOPY  2013   COLONOSCOPY WITH PROPOFOL N/A 07/11/2021   Procedure: COLONOSCOPY WITH PROPOFOL;  Surgeon: 07/24/2021, MD;  [facility] END[facility]COPY;  Service: Endoscopy;  Laterality: N/A;   DIALYSIS/PERMA CATHETER INSERTION N/A 12/15/2021   Procedure: DIALYSIS/PERMA CATHETER INSERTION;  Surgeon: 12/28/2021, MD;  Location: [place] CV LAB;  Service: Cardiovascular;  Laterality: N/A;   ESOPHAGOGASTRODUODENOSCOPY (EGD) WITH PROPOFOL N/A 03/17/2015   Procedure: ESOPHAGOGASTRODUODENOSCOPY (EGD) WITH PROPOFOL;  Surgeon: 03/30/2015, MD;  Location: [facility] ENDOSCOPY;  Service: Gastroenterology;  Laterality: N/A;    ESOPHAGOGASTRODUODENOSCOPY (EGD) WITH PROPOFOL N/A 07/10/2021   Procedure: ESOPHAGOGASTRODUODENOSCOPY (EGD) WITH PROPOFOL;  Surgeon: 16/03/2022, MD;  Location: [facility] ENDOSCOPY;  Service: Gastroenterology;  Laterality: N/A;   GIVENS CAPSULE STUDY N/A 07/11/2021   Procedure: GIVENS CAPSULE STUDY;  Surgeon: 07/24/2021, MD;  Location: [facility] ENDOSCOPY;  Service: Endoscopy;  Laterality: N/A;   NM MYOVIEW ([place] H[facility])  07/2015   No evidence ischemia or infarction. EF 66%. Low risk   TEMPORARY DIALYSIS CATHETER N/A 12/08/2021   Procedure: TEMPORARY DIALYSIS CATHETER;  Surgeon: 21/01/2022, MD;  Location: [place] CV LAB;  Service: Cardiovascular;  Laterality: N/A;   TONSILLECTOMY     TRANSTHORACIC ECHOCARDIOGRAM  10/2013   Normal LV size and function. EF 55-65%. GR 1 DD. Otherwise normal.    Social History:  reports that she has never smoked. She has never used smokeless tobacco. She reports that she does not drink alcohol and does not use drugs.  Family History:  Family History  Problem Relation Age  of Onset   Arthritis Mother    Stroke Mother    Hypertension Mother    Osteoporosis Mother    Heart disease Father        MI   Heart attack Father    Stroke Brother    Heart attack Sister    Breast cancer Other        first cousin x 2   Stroke Daughter    Heart attack Daughter    Colon cancer Neg Hx      Prior to Admission medications   Medication Sig Start Date End Date Taking? Authorizing Provider  amiodarone (PACERONE) 200 MG tablet Take 1 tablet (200 mg total) by mouth daily. 12/20/21   Nolberto Hanlon, MD  diclofenac Sodium (VOLTAREN) 1 % GEL Apply 2 g topically 3 (three) times daily as needed (pain in legs and elbows). 12/19/21   Nolberto Hanlon, MD  docusate sodium (STOOL SOFTENER) 100 MG capsule Take 1 capsule (100 mg total) by mouth 2 (two) times daily. 12/19/21   Nolberto Hanlon, MD  lactulose (CHRONULAC) 10 GM/15ML solution Take 30 mLs (20 g total) by mouth 2 (two) times daily.  12/19/21   Nolberto Hanlon, MD  lidocaine (LIDODERM) 5 % Place 2 patches onto the skin daily. Remove & Discard patch within 12 hours or as directed by MD 12/19/21   Nolberto Hanlon, MD  nitroGLYCERIN (NITROSTAT) 0.4 MG SL tablet Place 1 tablet (0.4 mg total) under the tongue every 5 (five) minutes as needed for chest pain. 12/19/21   Nolberto Hanlon, MD  pantoprazole (PROTONIX) 40 MG tablet Take 1 tablet (40 mg total) by mouth 2 (two) times daily. 12/19/21   Nolberto Hanlon, MD  rifaximin (XIFAXAN) 200 MG tablet Take 1 tablet (200 mg total) by mouth 2 (two) times daily. 12/19/21   Nolberto Hanlon, MD  torsemide 40 MG TABS Take 40 mg by mouth 3 (three) times a week. 12/21/21   Nolberto Hanlon, MD    Physical Exam: Vitals:   01/09/22 1600 01/09/22 1700 01/09/22 1734 01/09/22 1800  BP:  (!) 140/56 (!) 133/55 91/69  Pulse: 80 75 77 78  Resp: (!) 31 17 (!) 21 (!) 23  Temp:   97.8 F (36.6 C) 97.8 F (36.6 C)  TempSrc:   Oral Oral  SpO2: 100% 98% 100% 100%  Weight:       General: Not in acute distress HEENT:       Eyes: PERRL, EOMI, no scleral icterus.       ENT: No discharge from the ears and nose, no pharynx injection, no tonsillar enlargement.        Neck: No JVD, no bruit, no mass felt. Heme: No neck lymph node enlargement. Cardiac: S1/S2, RRR, No gallops or rubs. Respiratory: has wheezing bilaterally. GI: Soft, nondistended, nontender, no rebound pain, no organomegaly, BS present. GU: No hematuria Ext: 1+ pitting leg edema bilaterally. 1+DP/PT pulse bilaterally. Musculoskeletal: No joint deformities, No joint redness or warmth, no limitation of ROM in spin. Skin: No rashes.  Neuro: Alert, oriented X3, cranial nerves II-XII grossly intact, moves all extremities normally. Psych: Patient is not psychotic, no suicidal or hemocidal ideation.  Labs on Admission: I have personally reviewed following labs and imaging studies  CBC: Recent Labs  Lab 01/09/22 1516  WBC 13.5*  HGB 7.2*  HCT 23.1*  MCV  101.8*  PLT 174   Basic Metabolic Panel: Recent Labs  Lab 01/09/22 1516  NA 136  K 3.7  CL 103  CO2  22  GLUCOSE 132*  BUN 72*  CREATININE 4.93*  CALCIUM 9.2   GFR: Estimated Creatinine Clearance: 8.3 mL/min (A) (by C-G formula based on SCr of 4.93 mg/dL (H)). Liver Function Tests: No results for input(s): "AST", "ALT", "ALKPHOS", "BILITOT", "PROT", "ALBUMIN" in the last 168 hours. No results for input(s): "LIPASE", "AMYLASE" in the last 168 hours. No results for input(s): "AMMONIA" in the last 168 hours. Coagulation Profile: Recent Labs  Lab 01/09/22 1516  INR 1.3*   Cardiac Enzymes: No results for input(s): "CKTOTAL", "CKMB", "CKMBINDEX", "TROPONINI" in the last 168 hours. BNP (last 3 results) No results for input(s): "PROBNP" in the last 8760 hours. HbA1C: No results for input(s): "HGBA1C" in the last 72 hours. CBG: No results for input(s): "GLUCAP" in the last 168 hours. Lipid Profile: No results for input(s): "CHOL", "HDL", "LDLCALC", "TRIG", "CHOLHDL", "LDLDIRECT" in the last 72 hours. Thyroid Function Tests: No results for input(s): "TSH", "T4TOTAL", "FREET4", "T3FREE", "THYROIDAB" in the last 72 hours. Anemia Panel: Recent Labs    01/09/22 1516  FOLATE 7.0  FERRITIN 101  TIBC 270  IRON 38  RETICCTPCT 6.0*   Urine analysis:    Component Value Date/Time   COLORURINE STRAW (A) 10/17/2021 2030   APPEARANCEUR CLEAR (A) 10/17/2021 2030   LABSPEC 1.005 10/17/2021 2030   PHURINE 6.0 10/17/2021 2030   GLUCOSEU NEGATIVE 10/17/2021 2030   GLUCOSEU NEGATIVE 12/15/2016 1117   HGBUR MODERATE (A) 10/17/2021 2030   BILIRUBINUR NEGATIVE 10/17/2021 2030   [place] 10/17/2021 2030   PROTEINUR 30 (A) 10/17/2021 2030   UROBILINOGEN 0.2 12/15/2016 1117   NITRITE NEGATIVE 10/17/2021 2030   LEUKOCYTESUR NEGATIVE 10/17/2021 2030   Sepsis Labs: '@LABRCNTIP'$ (procalcitonin:4,lacticidven:4) )No results found for this or any previous visit (from the past 240  hour(s)).   Radiological Exams on Admission: DG Chest 2 View  Result Date: 01/09/2022 CLINICAL DATA:  Chest pain, shortness of breath EXAM: CHEST - 2 VIEW COMPARISON:  Previous studies including the examination of 12/09/2021 FINDINGS: Transverse diameter of heart is increased. There is soft tissue fullness in the retrocardiac region extending to the medial right lower lung field, most likely large fixed hiatal hernia. There are no signs of alveolar pulmonary edema or focal pulmonary consolidation. There is blunting of both posterior costophrenic angles. There is no pneumothorax. There is interval placement of dialysis catheter with its tip at the junction of superior vena cava and right atrium. IMPRESSION: There are no signs of pulmonary edema are focal pulmonary consolidation. Small bilateral pleural effusions. Fixed hiatal hernia. Electronically Signed   By: Elmer Picker M.D.   On: 01/09/2022 16:26      Assessment/Plan Principal Problem:   COPD exacerbation (HCC) Active Problems:   Sepsis ([place])   Anemia in ESRD (end-stage renal disease) (HCC)   Symptomatic anemia   CAD S/P PCI pRCA Promus DES 3.5 x 16 (4.1 mm), ostRPDA Promus DES 2.5 x 12 (2.7 mm)   Chronic diastolic CHF (congestive heart failure) (HCC)   ESRD on dialysis (HCC)   Cirrhosis (HCC)   HTN (hypertension)   Hyperlipidemia   OSA on CPAP   Paroxysmal atrial fibrillation (HCC)   Assessment and Plan: * COPD exacerbation ([place]) Patient has cough, wheezing on auscultation, indicating COPD exacerbation.  No oxygen desaturation.  Chest x-ray is negative for infiltration or pulmonary edema.  - will place to tele bed for observation -Bronchodilators -Solu-Medrol 40 mg IV bid -Doxycycline 100 mg twice daily -Mucinex for cough  -Incentive spirometry -sputum culture -check [id number] -  Nasal cannula oxygen as needed to maintain O2 saturation 93% or greater     Sepsis ([place]) Sepsis due to COPD exacerbation: Patient meets  criteria for sepsis with WBC 13.5, RR 31.  Pending lactic acid level. -Check procalcitonin level -Trend troponin -Doxycycline is ordered  Anemia in ESRD (end-stage renal disease) (HCC) Baseline hemoglobin 8.5.  Today her hemoglobin is 7.2.  No dark stool or rectal bleeding. -2 unit of blood is ordered by EDP.  Patient is already standard for transfusion in ED.  Will change to 1 unit of blood transfusion -Check anemia panel  Symptomatic anemia - Transfuse 1 unit of blood  Chronic diastolic CHF (congestive heart failure) (HCC) 2D echo on 12/08/2021 showed EF of 55-60% with grade 2 diastolic dysfunction.  Patient has 1+ leg edema, but no JVD, no pulmonary edema on chest x-ray.  Does not seem to have fluid overload or CHF exacerbation. -Continue home torsemide -Fluid management per renal by dialysis  CAD S/P PCI pRCA Promus DES 3.5 x 16 (4.1 mm), ostRPDA Promus DES 2.5 x 12 (2.7 mm) Troponin level 22, denies chest pain, likely nonspecific or decreased clearance secondary to ESRD. -As needed nitroglycerin  ESRD on dialysis [facility]) - Consulted Dr. Candiss Norse of renal for dialysis  HTN (hypertension) - IV hydralazine as needed -Patient has on torsemide  Cirrhosis ([place]) No abdominal pain.  INR 1.2 -Check  PTT -Continue rifaximin and Lasix -Continue lactulose -Check ammonia level  Hyperlipidemia Patient is not taking medications currently -Follow-up with PCP  Paroxysmal atrial fibrillation (HCC) Heart rate 75.  Patient is not taking anticoagulants due to GI bleeding -Telemetry monitoring -Amiodarone          DVT ppx: SCD  Code Status: DNR per pt (his son is aware of this)  Family Communication:  Yes, patient's son   by phone  Disposition Plan:  Anticipate discharge back to previous environment  Consults called: Dr. Candiss Norse for renal  Admission status and Level of care: Telemetry Medical:     for obs      Dispo: The patient is from: SNF              Anticipated d/c is  to: SNF              Anticipated d/c date is: 1 day              Patient currently is not medically stable to d/c.    Severity of Illness:  The appropriate patient status for this patient is OBSERVATION. Observation status is judged to be reasonable and necessary in order to provide the required intensity of service to ensure the patient's safety. The patient's presenting symptoms, physical exam findings, and initial radiographic and laboratory data in the context of their medical condition is felt to place them at decreased risk for further clinical deterioration. Furthermore, it is anticipated that the patient will be medically stable for discharge from the hospital within 2 midnights of admission.        Date of Service 01/09/2022    Ivor Costa [facility]   If 7PM-7AM, please contact night-coverage [email or web address] 01/09/2022, 7:37 PM

## 2022-01-09 NOTE — Assessment & Plan Note (Signed)
2D echo on 12/08/2021 showed EF of 55-60% with grade 2 diastolic dysfunction.  Patient has 1+ leg edema, but no JVD, no pulmonary edema on chest x-ray.  Does not seem to have fluid overload or CHF exacerbation. -Continue home torsemide -Fluid management per renal by dialysis

## 2022-01-09 NOTE — ED Triage Notes (Signed)
Pt is also a HD and goes [facility]. Pt has not missed any HD sessions.

## 2022-01-09 NOTE — ED Provider Notes (Signed)
[facility] Provider Note    Event Date/Time   First MD Initiated Contact with Patient 01/09/22 1619     (approximate)   History   Abnormal Lab and Shortness of Breath   HPI  April [name] is a 81 y.o. female who comes in feeling weak and a little short of breath with exertion.  She reports she has a history of anemia and when she gets like this she needs blood.  She has had transfusions twice in the past.  She remembers all of the benefits and risks of transfusion.      Physical Exam   Triage Vital Signs: ED Triage Vitals  Enc Vitals Group     BP 01/09/22 1504 (!) 132/50     Pulse Rate 01/09/22 1504 65     Resp 01/09/22 1504 16     Temp 01/09/22 1504 98 F (36.7 C)     Temp src --      SpO2 01/09/22 1504 93 %     Weight 01/09/22 1501 184 lb (83.5 kg)     Height --      Head Circumference --      Peak Flow --      Pain Score 01/09/22 1501 0     Pain Loc --      Pain Edu? --      Excl. in [place]? --     Most recent vital signs: Vitals:   01/09/22 1504 01/09/22 1600  BP: (!) 132/50   Pulse: 65 80  Resp: 16 (!) 31  Temp: 98 F (36.7 C)   SpO2: 93% 100%     General: Awake, no distress.  CV:  Good peripheral perfusion.  Heart regular rate and rhythm no audible murmurs Resp:  Normal effort.  Lungs are clear Abd:  No distention.  Soft and nontender    ED Results / Procedures / Treatments   Labs (all labs ordered are listed, but only abnormal results are displayed) Labs Reviewed  BASIC METABOLIC PANEL - Abnormal; Notable for the following components:      Result Value   Glucose, Bld 132 (*)    BUN 72 (*)    Creatinine, Ser 4.93 (*)    GFR, Estimated 8 (*)    All other components within normal limits  CBC - Abnormal; Notable for the following components:   WBC 13.5 (*)    RBC 2.27 (*)    Hemoglobin 7.2 (*)    HCT 23.1 (*)    MCV 101.8 (*)    RDW 22.2 (*)    All other components within normal limits  PROTIME-INR -  Abnormal; Notable for the following components:   Prothrombin Time 15.8 (*)    INR 1.3 (*)    All other components within normal limits  TROPONIN I (HIGH SENSITIVITY) - Abnormal; Notable for the following components:   Troponin I (High Sensitivity) 22 (*)    All other components within normal limits  TYPE AND SCREEN  PREPARE RBC (CROSSMATCH)  TROPONIN I (HIGH SENSITIVITY)     EKG  EKG read interpreted by me shows normal sinus rhythm at 76 first-degree AV block left bundle branch block no obvious acute ST-T changes   RADIOLOGY Chest x-ray read by radiology reviewed and interpreted by me shows a dialysis catheter present and small bilateral effusions but no CHF.   PROCEDURES:  Critical Care performed:   Procedures   MEDICATIONS ORDERED IN ED: Medications  0.9 %  sodium  chloride infusion (has no administration in time range)     IMPRESSION / MDM / ASSESSMENT AND PLAN / ED COURSE  I reviewed the triage vital signs and the nursing notes.  Patient with symptomatic anemia we will plan on transfusing her  Differential diagnosis includes, but is not limited to, symptomatic anemia.  Patient does not have any signs of cardiac disease or pulmonary disease or musculoskeletal disease.  She seems to be having blood loss associated with her dialysis.  She reports she gets iron with dialysis and has been having green-gray stools for some time with the iron never having black stools she has not had stool and positive for blood loss she is not urinating any blood or having any vaginal blood loss or any other bleeding.  Patient's presentation is most consistent with acute presentation with potential threat to life or bodily function.  The patient is on the cardiac monitor to evaluate for evidence of arrhythmia and/or significant heart rate changes.  None have been seen      FINAL CLINICAL IMPRESSION(S) / ED DIAGNOSES   Final diagnoses:  Symptomatic anemia     Rx / DC Orders   ED  Discharge Orders     None        Note:  This document was prepared using Dragon voice recognition software and may include unintentional dictation errors.   Nena Polio, MD 01/09/22 [phone]

## 2022-01-09 NOTE — Assessment & Plan Note (Signed)
Patient is not taking medications currently -Follow-up with PCP

## 2022-01-09 NOTE — Assessment & Plan Note (Signed)
Baseline hemoglobin 8.5.  Which was 7.2 on admission, improved to 7.5 after getting 1 unit of PRBC.  Patient gets Mircera as an outpatient.   Anemia panel without any significant deficiency. Most likely anemia of chronic kidney disease. -Continue to monitor -Transfuse if below 7

## 2022-01-09 NOTE — Assessment & Plan Note (Signed)
Sepsis due to COPD exacerbation: Patient meets criteria for sepsis with WBC 13.5, RR 31.  Lactic acid elevated at 2.7, started trending down. Procalcitonin at 0.56 -Continue doxycycline

## 2022-01-10 ENCOUNTER — Encounter: Payer: Self-pay | Admitting: Internal Medicine

## 2022-01-10 DIAGNOSIS — I480 Paroxysmal atrial fibrillation: Secondary | ICD-10-CM | POA: Diagnosis present

## 2022-01-10 DIAGNOSIS — R188 Other ascites: Secondary | ICD-10-CM | POA: Diagnosis not present

## 2022-01-10 DIAGNOSIS — R2689 Other abnormalities of gait and mobility: Secondary | ICD-10-CM | POA: Diagnosis not present

## 2022-01-10 DIAGNOSIS — I5030 Unspecified diastolic (congestive) heart failure: Secondary | ICD-10-CM | POA: Diagnosis not present

## 2022-01-10 DIAGNOSIS — K7460 Unspecified cirrhosis of liver: Secondary | ICD-10-CM | POA: Diagnosis present

## 2022-01-10 DIAGNOSIS — J9601 Acute respiratory failure with hypoxia: Secondary | ICD-10-CM | POA: Diagnosis present

## 2022-01-10 DIAGNOSIS — K7682 Hepatic encephalopathy: Secondary | ICD-10-CM | POA: Diagnosis not present

## 2022-01-10 DIAGNOSIS — I2699 Other pulmonary embolism without acute cor pulmonale: Secondary | ICD-10-CM | POA: Diagnosis present

## 2022-01-10 DIAGNOSIS — R0602 Shortness of breath: Secondary | ICD-10-CM | POA: Diagnosis not present

## 2022-01-10 DIAGNOSIS — R2681 Unsteadiness on feet: Secondary | ICD-10-CM | POA: Diagnosis not present

## 2022-01-10 DIAGNOSIS — N186 End stage renal disease: Secondary | ICD-10-CM | POA: Diagnosis present

## 2022-01-10 DIAGNOSIS — G4733 Obstructive sleep apnea (adult) (pediatric): Secondary | ICD-10-CM | POA: Diagnosis present

## 2022-01-10 DIAGNOSIS — G4730 Sleep apnea, unspecified: Secondary | ICD-10-CM | POA: Diagnosis present

## 2022-01-10 DIAGNOSIS — K449 Diaphragmatic hernia without obstruction or gangrene: Secondary | ICD-10-CM | POA: Diagnosis present

## 2022-01-10 DIAGNOSIS — E7800 Pure hypercholesterolemia, unspecified: Secondary | ICD-10-CM | POA: Diagnosis present

## 2022-01-10 DIAGNOSIS — E669 Obesity, unspecified: Secondary | ICD-10-CM | POA: Diagnosis present

## 2022-01-10 DIAGNOSIS — Z8249 Family history of ischemic heart disease and other diseases of the circulatory system: Secondary | ICD-10-CM | POA: Diagnosis not present

## 2022-01-10 DIAGNOSIS — Z992 Dependence on renal dialysis: Secondary | ICD-10-CM | POA: Diagnosis not present

## 2022-01-10 DIAGNOSIS — N2581 Secondary hyperparathyroidism of renal origin: Secondary | ICD-10-CM | POA: Diagnosis not present

## 2022-01-10 DIAGNOSIS — K219 Gastro-esophageal reflux disease without esophagitis: Secondary | ICD-10-CM | POA: Diagnosis present

## 2022-01-10 DIAGNOSIS — J449 Chronic obstructive pulmonary disease, unspecified: Secondary | ICD-10-CM | POA: Diagnosis not present

## 2022-01-10 DIAGNOSIS — I132 Hypertensive heart and chronic kidney disease with heart failure and with stage 5 chronic kidney disease, or end stage renal disease: Secondary | ICD-10-CM | POA: Diagnosis present

## 2022-01-10 DIAGNOSIS — R600 Localized edema: Secondary | ICD-10-CM | POA: Diagnosis not present

## 2022-01-10 DIAGNOSIS — I2510 Atherosclerotic heart disease of native coronary artery without angina pectoris: Secondary | ICD-10-CM | POA: Diagnosis present

## 2022-01-10 DIAGNOSIS — I5032 Chronic diastolic (congestive) heart failure: Secondary | ICD-10-CM | POA: Diagnosis not present

## 2022-01-10 DIAGNOSIS — E1122 Type 2 diabetes mellitus with diabetic chronic kidney disease: Secondary | ICD-10-CM | POA: Diagnosis present

## 2022-01-10 DIAGNOSIS — Z4901 Encounter for fitting and adjustment of extracorporeal dialysis catheter: Secondary | ICD-10-CM | POA: Diagnosis not present

## 2022-01-10 DIAGNOSIS — Z20822 Contact with and (suspected) exposure to covid-19: Secondary | ICD-10-CM | POA: Diagnosis present

## 2022-01-10 DIAGNOSIS — I5022 Chronic systolic (congestive) heart failure: Secondary | ICD-10-CM | POA: Diagnosis not present

## 2022-01-10 DIAGNOSIS — R079 Chest pain, unspecified: Secondary | ICD-10-CM | POA: Diagnosis not present

## 2022-01-10 DIAGNOSIS — Z66 Do not resuscitate: Secondary | ICD-10-CM | POA: Diagnosis present

## 2022-01-10 DIAGNOSIS — Z515 Encounter for palliative care: Secondary | ICD-10-CM | POA: Diagnosis not present

## 2022-01-10 DIAGNOSIS — J441 Chronic obstructive pulmonary disease with (acute) exacerbation: Secondary | ICD-10-CM | POA: Diagnosis present

## 2022-01-10 DIAGNOSIS — M810 Age-related osteoporosis without current pathological fracture: Secondary | ICD-10-CM | POA: Diagnosis present

## 2022-01-10 DIAGNOSIS — R7989 Other specified abnormal findings of blood chemistry: Secondary | ICD-10-CM | POA: Diagnosis not present

## 2022-01-10 DIAGNOSIS — Z7189 Other specified counseling: Secondary | ICD-10-CM | POA: Diagnosis not present

## 2022-01-10 DIAGNOSIS — D631 Anemia in chronic kidney disease: Secondary | ICD-10-CM | POA: Diagnosis present

## 2022-01-10 DIAGNOSIS — I2693 Single subsegmental pulmonary embolism without acute cor pulmonale: Secondary | ICD-10-CM | POA: Diagnosis not present

## 2022-01-10 DIAGNOSIS — M6281 Muscle weakness (generalized): Secondary | ICD-10-CM | POA: Diagnosis not present

## 2022-01-10 DIAGNOSIS — R531 Weakness: Secondary | ICD-10-CM | POA: Diagnosis not present

## 2022-01-10 DIAGNOSIS — D696 Thrombocytopenia, unspecified: Secondary | ICD-10-CM | POA: Diagnosis not present

## 2022-01-10 DIAGNOSIS — J3800 Paralysis of vocal cords and larynx, unspecified: Secondary | ICD-10-CM | POA: Diagnosis not present

## 2022-01-10 DIAGNOSIS — Z9989 Dependence on other enabling machines and devices: Secondary | ICD-10-CM | POA: Diagnosis not present

## 2022-01-10 DIAGNOSIS — I447 Left bundle-branch block, unspecified: Secondary | ICD-10-CM | POA: Diagnosis present

## 2022-01-10 DIAGNOSIS — D649 Anemia, unspecified: Secondary | ICD-10-CM | POA: Diagnosis not present

## 2022-01-10 DIAGNOSIS — Z86010 Personal history of colonic polyps: Secondary | ICD-10-CM | POA: Diagnosis not present

## 2022-01-10 DIAGNOSIS — I482 Chronic atrial fibrillation, unspecified: Secondary | ICD-10-CM | POA: Diagnosis not present

## 2022-01-10 DIAGNOSIS — R0600 Dyspnea, unspecified: Secondary | ICD-10-CM | POA: Diagnosis not present

## 2022-01-10 DIAGNOSIS — I10 Essential (primary) hypertension: Secondary | ICD-10-CM | POA: Diagnosis not present

## 2022-01-10 LAB — CBC
HCT: 22.5 % — ABNORMAL LOW (ref 36.0–46.0)
Hemoglobin: 7.5 g/dL — ABNORMAL LOW (ref 12.0–15.0)
MCH: 31.4 pg (ref 26.0–34.0)
MCHC: 33.3 g/dL (ref 30.0–36.0)
MCV: 94.1 fL (ref 80.0–100.0)
Platelets: 124 10*3/uL — ABNORMAL LOW (ref 150–400)
RBC: 2.39 MIL/uL — ABNORMAL LOW (ref 3.87–5.11)
RDW: 20.6 % — ABNORMAL HIGH (ref 11.5–15.5)
WBC: 8.7 10*3/uL (ref 4.0–10.5)
nRBC: 0 % (ref 0.0–0.2)

## 2022-01-10 LAB — BASIC METABOLIC PANEL
Anion gap: 10 (ref 5–15)
BUN: 78 mg/dL — ABNORMAL HIGH (ref 8–23)
CO2: 22 mmol/L (ref 22–32)
Calcium: 8.6 mg/dL — ABNORMAL LOW (ref 8.9–10.3)
Chloride: 101 mmol/L (ref 98–111)
Creatinine, Ser: 5.02 mg/dL — ABNORMAL HIGH (ref 0.44–1.00)
GFR, Estimated: 8 mL/min — ABNORMAL LOW (ref >60)
Glucose, Bld: 141 mg/dL — ABNORMAL HIGH (ref 70–99)
Potassium: 3.5 mmol/L (ref 3.5–5.1)
Sodium: 133 mmol/L — ABNORMAL LOW (ref 135–145)

## 2022-01-10 LAB — LACTIC ACID, PLASMA: Lactic Acid, Venous: 2.1 mmol/L (ref 0.5–1.9)

## 2022-01-10 LAB — VITAMIN B12: Vitamin B-12: 1010 pg/mL — ABNORMAL HIGH (ref 180–914)

## 2022-01-10 MED ORDER — ACETAMINOPHEN 325 MG PO TABS
650.0000 mg | ORAL_TABLET | Freq: Four times a day (QID) | ORAL | Status: DC | PRN
  Administered 2022-01-10 – 2022-01-12 (×3): 650 mg via ORAL
  Filled 2022-01-10 (×2): qty 2

## 2022-01-10 MED ORDER — ALTEPLASE 2 MG IJ SOLR: 2.0000 mg | Freq: Once | INTRAMUSCULAR | Status: DC | PRN

## 2022-01-10 MED ORDER — CHLORHEXIDINE GLUCONATE CLOTH 2 % EX PADS
6.0000 | MEDICATED_PAD | Freq: Every day | CUTANEOUS | Status: DC
  Administered 2022-01-11 – 2022-01-13 (×2): 6 via TOPICAL

## 2022-01-10 MED ORDER — ANTICOAGULANT SODIUM CITRATE 4% (200MG/5ML) IV SOLN
5.0000 mL | Status: DC | PRN
  Administered 2022-01-12: 5 mL
  Filled 2022-01-10: qty 5

## 2022-01-10 MED ORDER — LIDOCAINE-PRILOCAINE 2.5-2.5 % EX CREA: 1.0000 | TOPICAL_CREAM | CUTANEOUS | Status: DC | PRN

## 2022-01-10 MED ORDER — ALBUMIN HUMAN 25 % IV SOLN
25.0000 g | Freq: Once | INTRAVENOUS | Status: AC
  Administered 2022-01-10: 25 g via INTRAVENOUS

## 2022-01-10 MED ORDER — TRAMADOL HCL 50 MG PO TABS
50.0000 mg | ORAL_TABLET | Freq: Four times a day (QID) | ORAL | Status: DC | PRN
  Administered 2022-01-10: 50 mg via ORAL
  Filled 2022-01-10: qty 1

## 2022-01-10 MED ORDER — LIDOCAINE HCL (PF) 1 % IJ SOLN: 5.0000 mL | INTRAMUSCULAR | Status: DC | PRN

## 2022-01-10 MED ORDER — HEPARIN SODIUM (PORCINE) 1000 UNIT/ML DIALYSIS: 1000.0000 [IU] | INTRAMUSCULAR | Status: DC | PRN

## 2022-01-10 MED ORDER — PENTAFLUOROPROP-TETRAFLUOROETH EX AERO: 1.0000 | INHALATION_SPRAY | CUTANEOUS | Status: DC | PRN

## 2022-01-10 MED ORDER — IPRATROPIUM-ALBUTEROL 0.5-2.5 (3) MG/3ML IN SOLN
3.0000 mL | Freq: Three times a day (TID) | RESPIRATORY_TRACT | Status: DC
  Administered 2022-01-10 – 2022-01-11 (×3): 3 mL via RESPIRATORY_TRACT
  Filled 2022-01-10 (×3): qty 3

## 2022-01-10 MED ORDER — ALBUMIN HUMAN 25 % IV SOLN
INTRAVENOUS | Status: AC
  Filled 2022-01-10: qty 50

## 2022-01-10 MED ORDER — ALBUMIN HUMAN 25 % IV SOLN
25.0000 g | Freq: Once | INTRAVENOUS | Status: AC
  Administered 2022-01-11: 25 g via INTRAVENOUS
  Filled 2022-01-10: qty 100

## 2022-01-10 MED ORDER — HEPARIN SODIUM (PORCINE) 1000 UNIT/ML IJ SOLN
INTRAMUSCULAR | Status: AC
  Filled 2022-01-10: qty 10

## 2022-01-10 NOTE — Progress Notes (Signed)
Pt 4 hour HD complete w/ no complications. Report to primary RN. Pt alert, no c/o, vss. Start: 0942 End: 1343 2076m fluid removed 96L BVP 82.6kg post bed weight Albumin 25g IV given w/ HD

## 2022-01-10 NOTE — Progress Notes (Signed)
Pt uf goal increased from 1L to 1.5L per Sherlyn Hay, NP. Pt alert, no c/o, vss. Safety maintained. RN at bedside.

## 2022-01-10 NOTE — Progress Notes (Signed)
Amin MD present. Removed patient 2LNC at patient request. Pt stated 02 sats normall 97-100% on RA. Alert, no c/o, vss, safety maintained. RN at bedside.

## 2022-01-10 NOTE — NC FL2 (Signed)
[place] LEVEL OF CARE SCREENING TOOL     IDENTIFICATION  Patient Name: April [name] Birthdate: 1940/11/11 Sex: female Admission Date (Current Location): 01/09/2022  [facility] and [place] Number:  Engineering geologist and Address:         Provider Number: [id number]  Attending Physician Name and Address:  Lorella Nimrod, MD  Relative Name and Phone Number:       Current Level of Care: Hospital Recommended Level of Care: [place] Prior Approval Number:    Date Approved/Denied:   PASRR Number: [id number]  Discharge Plan: SNF    Current Diagnoses: Patient Active Problem List   Diagnosis Date Noted   ESRD on dialysis ([place]) 01/09/2022   HTN (hypertension) 01/09/2022   COPD exacerbation ([place]) 01/09/2022   Sepsis ([place]) 01/09/2022   Anemia in ESRD (end-stage renal disease) ([place]) 01/09/2022   Thrombocytopenia ([place]) 70/62/3762   Acute metabolic encephalopathy 83/15/1761   Acute hepatic encephalopathy ([place]) 12/09/2021   Cirrhosis ([place]) 12/09/2021   Edema and ascites  12/08/2021   Anemia of chronic kidney failure, stage 4 (severe) ([place]) 12/07/2021   Weakness 10/29/2021   Acute respiratory failure with hypoxia ([employer]) 10/18/2021   History of recurrent GI bleed on apixaban 10/17/2021   History of adverse reaction to anticoagulant medication 10/17/2021   Electrolyte abnormality 10/17/2021   Chronic diastolic CHF (congestive heart failure) ([place]) 08/08/2021   Chronic obstructive pulmonary disease, unspecified COPD type ([place]) 08/01/2021   Iron deficiency anemia    Symptomatic anemia    GI bleeding 07/07/2021   Thrombophilia ([place]) 04/10/2021   Aortic atherosclerosis ([place]) 01/03/2021   Cellulitis of leg, left 10/26/2020   Breast tenderness 07/14/2020   Abnormal liver function tests 03/11/2020   Lymphedema 07/09/2018   Acute renal failure superimposed on stage 4 chronic kidney disease ([place]) 06/03/2018   Lower extremity edema 01/19/2018    Arthropathy of lumbar facet joint 08/08/2017   Degeneration of lumbar intervertebral disc 08/08/2017   Osteoarthritis of knee 08/08/2017   Paroxysmal atrial fibrillation ([place]) 03/27/2016   CAD S/P PCI pRCA Promus DES 3.5 x 16 (4.1 mm), ostRPDA Promus DES 2.5 x 12 (2.7 mm) 03/25/2016   Coronary artery disease involving native coronary artery of native heart with unstable angina pectoris ([place]) 06/01/2015   Diabetes mellitus ([place]) 05/16/2015   Gastritis 04/01/2015   Chest pain 04/01/2015   Hiatal hernia 08/25/2014   DOE (dyspnea on exertion) 07/15/2014   Congestion of throat 07/06/2014   Health care maintenance 07/06/2014   Fibrocystic breast disease 07/06/2014   Morbid obesity ([place]) 04/19/2014   Stress 02/16/2013   Rib pain on right side 02/16/2013   Acute on Chronic dyspnea 10/13/2012   GERD (gastroesophageal reflux disease) 10/13/2012   Essential hypertension, benign 08/18/2012   Hyperlipidemia 08/18/2012   History of colonic polyps 08/18/2012   OSA on CPAP 08/18/2012   Osteoporosis 08/18/2012    Orientation RESPIRATION BLADDER Height & Weight     Self, Time, Situation, Place  O2 (CPAP at night. 2L O2 [place]) Incontinent Weight: 82.6 kg Height:  5' (152.4 cm)  BEHAVIORAL SYMPTOMS/MOOD NEUROLOGICAL BOWEL NUTRITION STATUS      Continent Diet (Renal 1200 fluid restriction)  AMBULATORY STATUS COMMUNICATION OF NEEDS Skin   Limited Assist Verbally Bruising, Other (Comment) (Blisters on Thigh)                       Personal Care Assistance Level of Assistance  Functional Limitations Info             SPECIAL CARE FACTORS FREQUENCY  PT (By licensed PT), OT (By licensed OT) (HD TTS)                    Contractures Contractures Info: Not present    Additional Factors Info  Code Status, Allergies Code Status Info: Full Allergies Info: Penicillin, sulfa antibiotics           Current Medications (01/10/2022):  This is the current hospital active  medication list Current Facility-Administered Medications  Medication Dose Route Frequency Provider Last Rate Last Admin   [START ON 01/11/2022] albumin human 25 % solution 25 g  25 g Intravenous Once in dialysis Colon Flattery, NP       albuterol (PROVENTIL) (2.5 MG/3ML) 0.083% nebulizer solution 2.5 mg  2.5 mg Nebulization Q4H PRN Ivor Costa, MD       alteplase (CATHFLO ACTIVASE) injection 2 mg  2 mg Intracatheter Once PRN Colon Flattery, NP       amiodarone (PACERONE) tablet 200 mg  200 mg Oral Daily Ivor Costa, MD       anticoagulant sodium citrate solution 5 mL  5 mL Intracatheter PRN Colon Flattery, NP       Chlorhexidine Gluconate Cloth 2 % PADS 6 each  6 each Topical Q0600 Breeze, Benancio Deeds, NP       dextromethorphan-guaiFENesin (Reedy DM) 30-600 MG per 12 hr tablet 1 tablet  1 tablet Oral BID PRN Ivor Costa, MD   1 tablet at 01/09/22 2340   diclofenac Sodium (VOLTAREN) 1 % topical gel 2 g  2 g Topical TID PRN Ivor Costa, MD       docusate sodium (COLACE) capsule 100 mg  100 mg Oral BID Ivor Costa, MD   100 mg at 01/09/22 2339   doxycycline (VIBRA-TABS) tablet 100 mg  100 mg Oral Q12H Beers, Shanon Brow, RPH   100 mg at 01/10/22 0000   feeding supplement (PRO-STAT SUGAR FREE 64) liquid 30 mL  30 mL Oral TID WC Ivor Costa, MD       heparin injection 1,000 Units  1,000 Units Intracatheter PRN Colon Flattery, NP       hydrALAZINE (APRESOLINE) injection 5 mg  5 mg Intravenous Q2H PRN Ivor Costa, MD       hydrocortisone cream 1 % 1 Application  1 Application Topical QID Ivor Costa, MD       ipratropium-albuterol (DUONEB) 0.5-2.5 (3) MG/3ML nebulizer solution 3 mL  3 mL Nebulization TID Lorella Nimrod, MD       lactulose (CHRONULAC) 10 GM/15ML solution 20 g  20 g Oral BID Ivor Costa, MD   20 g at 01/09/22 2343   lidocaine (LIDODERM) 5 % 2 patch  2 patch Transdermal Q24H Ivor Costa, MD   2 patch at 01/09/22 2348   lidocaine (PF) (XYLOCAINE) 1 % injection 5 mL  5 mL Intradermal PRN  Colon Flattery, NP       lidocaine-prilocaine (EMLA) cream 1 Application  1 Application Topical PRN Colon Flattery, NP       methylPREDNISolone sodium succinate (SOLU-MEDROL) 40 mg/mL injection 40 mg  40 mg Intravenous Q12H Ivor Costa, MD   40 mg at 01/09/22 2338   multivitamin with minerals tablet 1 tablet  1 tablet Oral Daily Ivor Costa, MD       nitroGLYCERIN (NITROSTAT) SL tablet 0.4 mg  0.4 mg Sublingual Q5 min PRN Ivor Costa, MD  ondansetron (ZOFRAN) injection 4 mg  4 mg Intravenous Q8H PRN Ivor Costa, MD   4 mg at 01/10/22 0631   pantoprazole (PROTONIX) EC tablet 40 mg  40 mg Oral BID Ivor Costa, MD   40 mg at 01/09/22 2339   pentafluoroprop-tetrafluoroeth (GEBAUERS) aerosol 1 Application  1 Application Topical PRN Colon Flattery, NP       rifaximin Doreene Nest) tablet 200 mg  200 mg Oral BID Ivor Costa, MD   200 mg at 01/09/22 2339   [START ON 01/11/2022] torsemide (DEMADEX) tablet 40 mg  40 mg Oral Once per day on Mon Wed Fri Niu, Xilin, MD       Facility-Administered Medications Ordered in Other Encounters  Medication Dose Route Frequency Provider Last Rate Last Admin   albumin human 25 % solution            heparin sodium (porcine) 1000 UNIT/ML injection              Discharge Medications: Please see discharge summary for a list of discharge medications.  Relevant Imaging Results:  Relevant Lab Results:   Additional Information SS#: [id number]  Beverly Sessions, RN

## 2022-01-10 NOTE — Progress Notes (Signed)
Patient primary RN, Misty called [facility] with concerns about patient HD catheter. Candiss Norse, MD included in secure chat. Patient had no c/o and no s/s of irritation during HD treatment today. Patient received HD dressing change today as well. Will follow up with primary and patient.

## 2022-01-10 NOTE — Progress Notes (Signed)
2L [place] reapplied d/t 92% on RA. UF off d/t sbp<90 and pt c/o headache. Safety maintained. Pt alert. RN at bedside.

## 2022-01-10 NOTE — Progress Notes (Signed)
Progress Note   Patient: April [name] JQG:[id number] DOB: Oct 11, 1940 DOA: 01/09/2022     0 DOS: the patient was seen and examined on 01/10/2022   Brief hospital course: Taken from H&P.  BERTINA GUTHRIDGE is a 81 y.o. female with medical history significant of ESRD-HD (TTS), hypertension, hyperlipidemia, COPD, GERD, CAD, STEMI, DES stent placement, PAF not on anticoagulants due to GI bleeding, liver cirrhosis, left bundle blockade, migraine, dCHF, OSA on CPAP, who presents with shortness breath for last several days, associated with cough with little mucus production.  She was also experiencing some wheezing, no chest pain, fever or chills.  No urinary symptoms.  Continue to have dialysis regularly.  Denies any melena or rectal bleeding.   Data reviewed independently and ED Course: pt was found to have WBC 13.5, INR 1.2, troponin level 22, pending COVID PCR, potassium 3.7, bicarbonate 22, creatinine 4.93, BUN 72.  Temperature normal, blood pressure 140/56, heart rate 75, RR 31, oxygen saturation 98% on room air.  Chest x-ray is negative for infiltration, no pulmonary edema.   EKG: I have personally reviewed.  Atrial fibrillation, QTc 497, left bundle blockade, poor R wave progression.  Admitted for concern of COPD exacerbation and started on steroid, doxycycline and bronchodilators. Patient met sepsis criteria with leukocytosis and tachypnea. Lactic acid elevated at 2.7-started improving.  Ammonia levels at 38.  COVID-19 PCR negative.  Troponin barely positive with a flat curve.  Procalcitonin at 0.56.  Anemia panel without any significant abnormality, most likely anemia of chronic kidney disease.  9/12: Hemodynamically stable.  Weaned off to room air. Received 1 unit of PRBC with improvement of hemoglobin to 7.5.  Anemia panel without any significant deficiencies.  Patient gets Mircera as an outpatient. Patient received her dialysis today for removal of some extra fluid.  Plan is to have another  UF only treatment tomorrow. May be go back to her facility after tomorrow's treatment if remains stable.   Assessment and Plan: * COPD exacerbation ([place]) Patient has cough, wheezing on auscultation, indicating COPD exacerbation.  No oxygen desaturation.  Chest x-ray is negative for infiltration or pulmonary edema. Wheezing improved.  COVID 19 PCR negative. -Continue with steroid. -Continue with bronchodilators and supportive care.    Sepsis ([place]) Sepsis due to COPD exacerbation: Patient meets criteria for sepsis with WBC 13.5, RR 31.  Lactic acid elevated at 2.7, started trending down. Procalcitonin at 0.56 -Continue doxycycline  Anemia in ESRD (end-stage renal disease) (HCC) Baseline hemoglobin 8.5.  Which was 7.2 on admission, improved to 7.5 after getting 1 unit of PRBC.  Patient gets Mircera as an outpatient.   Anemia panel without any significant deficiency. Most likely anemia of chronic kidney disease. -Continue to monitor -Transfuse if below 7  Symptomatic anemia - Transfuse 1 unit of blood  Chronic diastolic CHF (congestive heart failure) (HCC) 2D echo on 12/08/2021 showed EF of 55-60% with grade 2 diastolic dysfunction.  Patient has 1+ leg edema, but no JVD, no pulmonary edema on chest x-ray.  Does not seem to have fluid overload or CHF exacerbation. -Continue home torsemide -Fluid management per renal by dialysis  CAD S/P PCI pRCA Promus DES 3.5 x 16 (4.1 mm), ostRPDA Promus DES 2.5 x 12 (2.7 mm) Troponin level 22, denies chest pain, likely nonspecific or decreased clearance secondary to ESRD. -As needed nitroglycerin  ESRD on dialysis [facility]) - Consulted Dr. Candiss Norse of renal for dialysis  HTN (hypertension) - IV hydralazine as needed -Patient has on torsemide  Cirrhosis (  [place]) No abdominal pain.  INR 1.2 -Check  PTT -Continue rifaximin and Lasix -Continue lactulose -Check ammonia level  Hyperlipidemia Patient is not taking medications currently -Follow-up  with PCP  Paroxysmal atrial fibrillation (HCC) Heart rate 75.  Patient is not taking anticoagulants due to GI bleeding -Telemetry monitoring -Amiodarone   Subjective: Patient was seen during dialysis.  She was very concerned about her anemia.  Shortness of breath improving.  She was on room air.  Physical Exam: Vitals:   01/10/22 1335 01/10/22 1343 01/10/22 1346 01/10/22 1400  BP: (!) 117/55 (!) 110/51 (!) 116/46   Pulse:  75 79   Resp:  17 (!) 22   Temp:  97.9 F (36.6 C)    TempSrc:  Oral    SpO2:  98% 98%   Weight:    82.6 kg  Height:       General.  Obese elderly lady, in no acute distress. Pulmonary.  Lungs clear bilaterally, normal respiratory effort. CV.  Regular rate and rhythm, no JVD, rub or murmur. Abdomen.  Soft, nontender, nondistended, BS positive. CNS.  Alert and oriented .  No focal neurologic deficit. Extremities.  1+ LE edema, no cyanosis, pulses intact and symmetrical. Psychiatry.  Judgment and insight appears normal.  Data Reviewed: Prior data reviewed  Family Communication: Called son and left a voicemail.  Apparently number listed as home number is his cell number in the chart.  Number listed as mobile is his office number.  Disposition: Status is: Inpatient Remains inpatient appropriate because: Severity of illness.   Planned Discharge Destination: Skilled nursing facility  Time spent: 50 minutes  This record has been created using Systems analyst. Errors have been sought and corrected,but may not always be located. Such creation errors do not reflect on the standard of care.  Author: Lorella Nimrod, MD 01/10/2022 3:07 PM  For on call review [email or web address].

## 2022-01-10 NOTE — Progress Notes (Signed)
Pre HD RN assessment 

## 2022-01-10 NOTE — TOC Initial Note (Signed)
Transition of Care [facility]) - Initial/Assessment Note    Patient Details  Name: April [name] MRN: [id number] Date of Birth: [birth year]  Transition of Care [name]) CM/SW Contact:    April Sessions, RN Phone Number: 01/10/2022, 3:56 PM  Clinical Narrative:                    Admitted for: COPD sepsis Admitted from:[facility] Current home health/prior home health/DME: CPAP  Per April [name] at [facility], they will be bringing the patient back to the skilled side at discharge.  April [name] is aware that there are not PT recommendations for SNF, and that the patient only has 1 night under inpatient status.   Notified patient and she is in agreement to discharge to SNF side.  She states she will update her daughter in law Patient on 2L acute o2.  Bedside RN to attempt to wean  Fl2 sent for signature       Patient Goals and CMS Choice        Expected Discharge Plan and Services                                                Prior Living Arrangements/Services                       Activities of Daily Living Home Assistive Devices/Equipment: Cane (specify quad or straight) ADL Screening (condition at time of admission) Patient's cognitive ability adequate to safely complete daily activities?: Yes Is the patient deaf or have difficulty hearing?: No Does the patient have difficulty seeing, even when wearing glasses/contacts?: No Does the patient have difficulty concentrating, remembering, or making decisions?: No Patient able to express need for assistance with ADLs?: Yes Does the patient have difficulty dressing or bathing?: No Independently performs ADLs?: Yes (appropriate for developmental age) Does the patient have difficulty walking or climbing stairs?: No Weakness of Legs: None Weakness of Arms/Hands: None  Permission Sought/Granted                  Emotional Assessment              Admission diagnosis:  COPD exacerbation ([place])  [J44.1] Symptomatic anemia [D64.9] Patient Active Problem List   Diagnosis Date Noted   ESRD on dialysis ([place]) 01/09/2022   HTN (hypertension) 01/09/2022   COPD exacerbation ([place]) 01/09/2022   Sepsis ([name]) 01/09/2022   Anemia in ESRD (end-stage renal disease) ([name]) 01/09/2022   Thrombocytopenia ([place]) 57/32/2025   Acute metabolic encephalopathy 42/70/6237   Acute hepatic encephalopathy ([place]) 12/09/2021   Cirrhosis ([name]) 12/09/2021   Edema and ascites  12/08/2021   Anemia of chronic kidney failure, stage 4 (severe) ([name]) 12/07/2021   Weakness 10/29/2021   Acute respiratory failure with hypoxia ([name]) 10/18/2021   History of recurrent GI bleed on apixaban 10/17/2021   History of adverse reaction to anticoagulant medication 10/17/2021   Electrolyte abnormality 10/17/2021   Chronic diastolic CHF (congestive heart failure) ([place]) 08/08/2021   Chronic obstructive pulmonary disease, unspecified COPD type ([place]) 08/01/2021   Iron deficiency anemia    Symptomatic anemia    GI bleeding 07/07/2021   Thrombophilia ([name]) 04/10/2021   Aortic atherosclerosis ([name]) 01/03/2021   Cellulitis of leg, left 10/26/2020   Breast tenderness 07/14/2020   Abnormal liver function tests 03/11/2020   Lymphedema 07/09/2018  Acute renal failure superimposed on stage 4 chronic kidney disease ([name]) 06/03/2018   Lower extremity edema 01/19/2018   Arthropathy of lumbar facet joint 08/08/2017   Degeneration of lumbar intervertebral disc 08/08/2017   Osteoarthritis of knee 08/08/2017   Paroxysmal atrial fibrillation ([name]) 03/27/2016   CAD S/P PCI pRCA Promus DES 3.5 x 16 (4.1 mm), ostRPDA Promus DES 2.5 x 12 (2.7 mm) 03/25/2016   Coronary artery disease involving native coronary artery of native heart with unstable angina pectoris ([place]) 06/01/2015   Diabetes mellitus ([name]) 05/16/2015   Gastritis 04/01/2015   Chest pain 04/01/2015   Hiatal hernia 08/25/2014   DOE (dyspnea on exertion) 07/15/2014   Congestion of  throat 07/06/2014   Health care maintenance 07/06/2014   Fibrocystic breast disease 07/06/2014   Morbid obesity ([name]) 04/19/2014   Stress 02/16/2013   Rib pain on right side 02/16/2013   Acute on Chronic dyspnea 10/13/2012   GERD (gastroesophageal reflux disease) 10/13/2012   Essential hypertension, benign 08/18/2012   Hyperlipidemia 08/18/2012   History of colonic polyps 08/18/2012   OSA on CPAP 08/18/2012   Osteoporosis 08/18/2012   PCP:  Einar Pheasant, MD Pharmacy:   [facility], [place] [address] [place] [address] Phone: [phone] Fax: [place], [place] [address] [place] [address] Phone: [phone] Fax: [place], [place] [address] [place] [address] Phone: [phone] Fax: [phone]     Social Determinants of Health ([name]) Interventions    Readmission Risk Interventions    11/28/2021   11:17 AM 10/19/2021   12:27 PM  Readmission Risk Prevention Plan  Transportation Screening Complete Complete  PCP or Specialist Appt within 3-5 Days Complete Complete  HRI or Home Care Consult Complete Complete  Social Work Consult for [place] Planning/Counseling Complete Complete  Palliative Care Screening Not Applicable Complete  Medication Review Press photographer) Complete Complete

## 2022-01-10 NOTE — Progress Notes (Signed)
Hemodialysis patient known at [place] 10:45am. Lives at [facility]. No dialysis concerns were stated.

## 2022-01-10 NOTE — Plan of Care (Signed)
  Problem: Clinical Measurements: Goal: Diagnostic test results will improve Outcome: Progressing   Problem: Nutrition: Goal: Adequate nutrition will be maintained Outcome: Progressing   Problem: Coping: Goal: Level of anxiety will decrease Outcome: Progressing   Problem: Elimination: Goal: Will not experience complications related to urinary retention Outcome: Progressing   Problem: Safety: Goal: Ability to remain free from injury will improve Outcome: Progressing   Problem: Skin Integrity: Goal: Risk for impaired skin integrity will decrease Outcome: Progressing

## 2022-01-10 NOTE — Progress Notes (Signed)
[facility]  ROUNDING NOTE   Subjective:   April [name] is a 81 year old female with past medical history including diastolic CHF, anemia, CAD, paroxysmal atrial fibrillation without anticoagulation.  GI bleed.  Chronic kidney disease stage IV.  Patient reports to ED with abnormal labs.  Patient has been admitted for COPD exacerbation ([place]) [J44.1] Symptomatic anemia [D64.9]  Patient is known to our practice and currently receives outpatient dialysis treatments at [facility] on a TTS schedule, supervised by Dr. Candiss Norse.  Patient recently started on dialysis during previous admission, tolerating treatments well thus far.  Patient states she was brought to emergency department due to abnormal labs.  Chart review shows staff reported hemoglobin 6.9.  Patient states she has felt fatigued with shortness of breath over the past few days.  Productive cough with wheezing.  Denies chest pain, fever or chills.  Reports appropriate appetite, slightly improved since previous admission.  Denies nausea or vomiting.  Currently on room air.  Moderate lower extremity edema.  Labs on ED arrival significant for BUN 72, creatinine 4.93 with GFR 8, elevated WBCs 13.5 with hemoglobin 7.2.  Patient negative for COVID-19.  Chest x-ray showed small bilateral pleural effusions but no pulmonary edema..  We have been consulted to provide dialysis during this admission.  Objective:  Vital signs in last 24 hours:  Temp:  [97.8 F (36.6 C)-98.4 F (36.9 C)] 97.8 F (36.6 C) (09/12 0941) Pulse Rate:  [65-80] 77 (09/12 1040) Resp:  [14-31] 22 (09/12 1055) BP: (91-140)/(41-69) 115/41 (09/12 1110) SpO2:  [93 %-100 %] 97 % (09/12 1110) Weight:  [83.4 kg-84 kg] 83.4 kg (09/12 0943)  Weight change:  Filed Weights   01/09/22 1501 01/10/22 0329 01/10/22 0943  Weight: 83.5 kg 84 kg 83.4 kg    Intake/Output: I/O last 3 completed shifts: In: 68 [P.O.:300; Blood:362] Out: -    Intake/Output this  shift:  Total I/O In: 0  Out: 1 [Stool:1]  Physical Exam: General: NAD  Head: Normocephalic, atraumatic. Moist oral mucosal membranes  Eyes: Anicteric  Lungs:  Clear to auscultation, normal effort, room air  Heart: Irregular rate and rhythm  Abdomen:  Soft, nontender, nondistended  Extremities: 3+ peripheral edema.  Neurologic: Nonfocal, moving all four extremities  Skin: No lesions, dry  Access: None    Basic Metabolic Panel: Recent Labs  Lab 01/09/22 1516 01/10/22 0451  NA 136 133*  K 3.7 3.5  CL 103 101  CO2 22 22  GLUCOSE 132* 141*  BUN 72* 78*  CREATININE 4.93* 5.02*  CALCIUM 9.2 8.6*     Liver Function Tests: No results for input(s): "AST", "ALT", "ALKPHOS", "BILITOT", "PROT", "ALBUMIN" in the last 168 hours.  No results for input(s): "LIPASE", "AMYLASE" in the last 168 hours. Recent Labs  Lab 01/09/22 2130  AMMONIA 38*    CBC: Recent Labs  Lab 01/09/22 1516 01/10/22 0451  WBC 13.5* 8.7  HGB 7.2* 7.5*  HCT 23.1* 22.5*  MCV 101.8* 94.1  PLT 162 124*     Cardiac Enzymes: No results for input(s): "CKTOTAL", "CKMB", "CKMBINDEX", "TROPONINI" in the last 168 hours.  BNP: Invalid input(s): "POCBNP"  CBG: No results for input(s): "GLUCAP" in the last 168 hours.   Microbiology: Results for orders placed or performed during the hospital encounter of 01/09/22  SARS Coronavirus 2 by RT PCR (hospital order, performed in [facility] hospital lab) *cepheid single result test* Anterior Nasal Swab     Status: None   Collection Time: 01/09/22  8:55  PM   Specimen: Anterior Nasal Swab  Result Value Ref Range Status   SARS Coronavirus 2 by RT PCR NEGATIVE NEGATIVE Final    Comment: (NOTE) SARS-CoV-2 target nucleic acids are NOT DETECTED.  The SARS-CoV-2 RNA is generally detectable in upper and lower respiratory specimens during the acute phase of infection. The lowest concentration of SARS-CoV-2 viral copies this assay can detect is 250 copies / mL. A  negative result does not preclude SARS-CoV-2 infection and should not be used as the sole basis for treatment or other patient management decisions.  A negative result may occur with improper specimen collection / handling, submission of specimen other than nasopharyngeal swab, presence of viral mutation(s) within the areas targeted by this assay, and inadequate number of viral copies (<250 copies / mL). A negative result must be combined with clinical observations, patient history, and epidemiological information.  Fact Sheet for Patients:   https://www.patel.info/  Fact Sheet for Healthcare Providers: https://hall.com/  This test is not yet approved or  cleared by the [place] FDA and has been authorized for detection and/or diagnosis of SARS-CoV-2 by FDA under an Emergency Use Authorization (EUA).  This EUA will remain in effect (meaning this test can be used) for the duration of the COVID-19 declaration under Section 564(b)(1) of the Act, 21 U.S.C. section 360bbb-3(b)(1), unless the authorization is terminated or revoked sooner.  Performed at [facility], [address] [place], [place] [address]     Coagulation Studies: Recent Labs    01/09/22 1516  LABPROT 15.8*  INR 1.3*     Urinalysis: No results for input(s): "COLORURINE", "LABSPEC", "PHURINE", "GLUCOSEU", "HGBUR", "BILIRUBINUR", "KETONESUR", "PROTEINUR", "UROBILINOGEN", "NITRITE", "LEUKOCYTESUR" in the last 72 hours.  Invalid input(s): "APPERANCEUR"    Imaging: DG Chest 2 View  Result Date: 01/09/2022 CLINICAL DATA:  Chest pain, shortness of breath EXAM: CHEST - 2 VIEW COMPARISON:  Previous studies including the examination of 12/09/2021 FINDINGS: Transverse diameter of heart is increased. There is soft tissue fullness in the retrocardiac region extending to the medial right lower lung field, most likely large fixed hiatal hernia. There are no signs of  alveolar pulmonary edema or focal pulmonary consolidation. There is blunting of both posterior costophrenic angles. There is no pneumothorax. There is interval placement of dialysis catheter with its tip at the junction of superior vena cava and right atrium. IMPRESSION: There are no signs of pulmonary edema are focal pulmonary consolidation. Small bilateral pleural effusions. Fixed hiatal hernia. Electronically Signed   By: Elmer Picker M.D.   On: 01/09/2022 16:26     Medications:    anticoagulant sodium citrate      amiodarone  200 mg Oral Daily   Chlorhexidine Gluconate Cloth  6 each Topical Q0600   docusate sodium  100 mg Oral BID   doxycycline  100 mg Oral Q12H   feeding supplement (PRO-STAT SUGAR FREE 64)  30 mL Oral TID WC   hydrocortisone cream  1 Application Topical QID   ipratropium-albuterol  3 mL Nebulization TID   lactulose  20 g Oral BID   lidocaine  2 patch Transdermal Q24H   methylPREDNISolone (SOLU-MEDROL) injection  40 mg Intravenous Q12H   multivitamin with minerals  1 tablet Oral Daily   pantoprazole  40 mg Oral BID   rifaximin  200 mg Oral BID   [START ON 01/11/2022] torsemide  40 mg Oral Once per day on Mon Wed Fri   albuterol, alteplase, anticoagulant sodium citrate, dextromethorphan-guaiFENesin, diclofenac Sodium, heparin, hydrALAZINE,  lidocaine (PF), lidocaine-prilocaine, nitroGLYCERIN, ondansetron (ZOFRAN) IV, pentafluoroprop-tetrafluoroeth  Assessment/ Plan:  [name] is a 81 y.o.  female with past medical history including diastolic CHF, anemia, CAD, paroxysmal atrial fibrillation without anticoagulation.  GI bleed.  Chronic kidney disease stage IV.  Patient reports to ED with complaints of lower extremity edema.  Patient has been admitted for COPD exacerbation ([place]) [J44.1] Symptomatic anemia [D64.9]  [facility]/TTS/right chest PermCath   Anemia of chronic kidney disease Lab Results  Component Value Date   HGB 7.5 (L)  01/10/2022   Symptomatic anemia reported on admission.  Patient denies rectal bleeding or dark stools.  1 unit blood transfusion received in the ED.  Patient receives [place] outpatient.  We will continue to monitor.  End-stage renal disease on hemodialysis.  Will maintain outpatient schedule if possible.  Patient scheduled to receive dialysis today, UF goal initially raised to 2.5 L due to extensive lower extremity edema.  Albumin and extended treatment time of 30 minutes given to support increased fluid removal.  UF goal reduced to 2 L due to hypotension.  We will plan for UF only treatment tomorrow for additional fluid removal.   3. Chronic diastolic heart failure. ECHO from 12/08/21 shows EF 55-60% with a Grade II diastolic dysfunction with a mildly mitral valve regurgitation.  Patient receives torsemide 40 mg.  Currently managing fluid with dialysis.    4. Secondary Hyperparathyroidism: Presumed Lab Results  Component Value Date   CALCIUM 8.6 (L) 01/10/2022   CAION 1.17 03/25/2016   PHOS 3.8 12/19/2021  Calcium and phosphorus within acceptable range.  We will continue to monitor.   LOS: 0 Kasidy Gianino 9/12/202311:16 AM

## 2022-01-10 NOTE — Progress Notes (Signed)
Post HD RN assessment 

## 2022-01-10 NOTE — Progress Notes (Signed)
Pt HD orders changed. UF goal increased to 2.5L, 30 minutes added to treatment time and Albumin w/ HD ordered per H. Candiss Norse MD. Pt alert, no c/o, vss, speaking w/ HD coordinator A. Morris. Safety maintained. RN at bedside.

## 2022-01-10 NOTE — Progress Notes (Signed)
Pt declines CPAP. Pt states she does wear CPAP at home but does not wish to wear one of ours tonight. Pt awrae that if she changes her mind one will be made available to her.

## 2022-01-10 NOTE — Hospital Course (Addendum)
Taken from H&P.  April [name] is a 81 y.o. female with medical history significant of ESRD-HD (TTS), hypertension, hyperlipidemia, COPD, GERD, CAD, STEMI, DES stent placement, PAF not on anticoagulants due to GI bleeding, liver cirrhosis, left bundle blockade, migraine, dCHF, OSA on CPAP, who presents with shortness breath for last several days, associated with cough with little mucus production.  She was also experiencing some wheezing, no chest pain, fever or chills.  No urinary symptoms.  Continue to have dialysis regularly.  Denies any melena or rectal bleeding.   Data reviewed independently and ED Course: pt was found to have WBC 13.5, INR 1.2, troponin level 22, pending COVID PCR, potassium 3.7, bicarbonate 22, creatinine 4.93, BUN 72.  Temperature normal, blood pressure 140/56, heart rate 75, RR 31, oxygen saturation 98% on room air.  Chest x-ray is negative for infiltration, no pulmonary edema.   EKG: I have personally reviewed.  Atrial fibrillation, QTc 497, left bundle blockade, poor R wave progression.  Admitted for concern of COPD exacerbation and started on steroid, doxycycline and bronchodilators. Patient met sepsis criteria with leukocytosis and tachypnea. Lactic acid elevated at 2.7-started improving.  Ammonia levels at 38.  COVID-19 PCR negative.  Troponin barely positive with a flat curve.  Procalcitonin at 0.56.  Anemia panel without any significant abnormality, most likely anemia of chronic kidney disease.  9/12: Hemodynamically stable.  Weaned off to room air. Received 1 unit of PRBC with improvement of hemoglobin to 7.5.  Anemia panel without any significant deficiencies.  Patient gets Mircera as an outpatient. Patient received her dialysis today for removal of some extra fluid.  Plan is to have another UF only treatment tomorrow. May be go back to her facility after tomorrow's treatment if remains stable.

## 2022-01-11 ENCOUNTER — Inpatient Hospital Stay: Payer: Medicare Other

## 2022-01-11 ENCOUNTER — Inpatient Hospital Stay (HOSPITAL_COMMUNITY)
Admit: 2022-01-11 | Discharge: 2022-01-11 | Disposition: A | Discharge status: Home / Self Care | Payer: Medicare Other | Attending: Internal Medicine | Admitting: Internal Medicine

## 2022-01-11 ENCOUNTER — Inpatient Hospital Stay
Admit: 2022-01-11 | Discharge: 2022-01-11 | Disposition: A | Discharge status: Home / Self Care | Payer: Medicare Other | Attending: Internal Medicine | Admitting: Internal Medicine

## 2022-01-11 DIAGNOSIS — R0602 Shortness of breath: Secondary | ICD-10-CM

## 2022-01-11 DIAGNOSIS — I2699 Other pulmonary embolism without acute cor pulmonale: Secondary | ICD-10-CM

## 2022-01-11 DIAGNOSIS — J441 Chronic obstructive pulmonary disease with (acute) exacerbation: Secondary | ICD-10-CM | POA: Diagnosis not present

## 2022-01-11 LAB — ECHOCARDIOGRAM COMPLETE
AR max vel: 2.53 cm2
AV Area VTI: 2.81 cm2
AV Area mean vel: 2.64 cm2
AV Mean grad: 7.7 mmHg
AV Peak grad: 13.9 mmHg
Ao pk vel: 1.86 m/s
Area-P 1/2: 3.21 cm2
Height: 60 in
MV VTI: 3.24 cm2
S' Lateral: 2.7 cm
Weight: 2768.98 oz

## 2022-01-11 LAB — HEPATITIS B SURFACE ANTIGEN: Hepatitis B Surface Ag: NONREACTIVE

## 2022-01-11 MED ORDER — PREDNISONE 20 MG PO TABS: 40.0000 mg | ORAL_TABLET | Freq: Every day | ORAL | 0 refills | Status: DC

## 2022-01-11 MED ORDER — IOHEXOL 350 MG/ML SOLN
75.0000 mL | Freq: Once | INTRAVENOUS | Status: AC | PRN
  Administered 2022-01-11: 75 mL via INTRAVENOUS

## 2022-01-11 MED ORDER — TECHNETIUM TO 99M ALBUMIN AGGREGATED
4.0000 | Freq: Once | INTRAVENOUS | Status: AC | PRN
  Administered 2022-01-11: 4.28 via INTRAVENOUS

## 2022-01-11 MED ORDER — DOXYCYCLINE HYCLATE 100 MG PO TABS: 100.0000 mg | ORAL_TABLET | Freq: Two times a day (BID) | ORAL | 0 refills | Status: DC

## 2022-01-11 MED ORDER — ALBUMIN HUMAN 25 % IV SOLN
INTRAVENOUS | Status: AC
  Filled 2022-01-11: qty 100

## 2022-01-11 MED ORDER — IPRATROPIUM-ALBUTEROL 0.5-2.5 (3) MG/3ML IN SOLN: 3.0000 mL | Freq: Four times a day (QID) | RESPIRATORY_TRACT | 0 refills | Status: DC | PRN

## 2022-01-11 MED ORDER — HEPARIN (PORCINE) 25000 UT/250ML-% IV SOLN
1100.0000 [IU]/h | INTRAVENOUS | Status: DC
  Administered 2022-01-11: 1100 [IU]/h via INTRAVENOUS
  Filled 2022-01-11: qty 250

## 2022-01-11 MED ORDER — ALBUTEROL SULFATE HFA 108 (90 BASE) MCG/ACT IN AERS: 2.0000 | INHALATION_SPRAY | Freq: Four times a day (QID) | RESPIRATORY_TRACT | 2 refills | Status: DC | PRN

## 2022-01-11 MED ORDER — PREDNISONE 20 MG PO TABS
40.0000 mg | ORAL_TABLET | Freq: Every day | ORAL | Status: DC
  Administered 2022-01-11 – 2022-01-13 (×3): 40 mg via ORAL
  Filled 2022-01-11 (×3): qty 2

## 2022-01-11 NOTE — Consult Note (Signed)
ANTICOAGULATION CONSULT NOTE - Initial Consult  Pharmacy Consult for heparin infusion Indication: pulmonary embolus  Allergies  Allergen Reactions   Penicillins Other (See Comments)    Okay to take amoxicillin/(pt does not recall what the reaction to penicillin was (30-83 years old)    Sulfa Antibiotics Rash    Patient Measurements: Height: 5' (152.4 cm) Weight: 78.5 kg (173 lb 1 oz) IBW/kg (Calculated) : 45.5 Heparin Dosing Weight: 65 kg   Vital Signs: Temp: 97.8 F (36.6 C) (09/13 1210) Temp Source: Oral (09/13 1146) BP: 124/51 (09/13 1210) Pulse Rate: 72 (09/13 1210)  Labs: Recent Labs    01/09/22 1516 01/09/22 1708 01/10/22 0451  HGB 7.2*  --  7.5*  HCT 23.1*  --  22.5*  PLT 162  --  124*  APTT 38*  --   --   LABPROT 15.8*  --   --   INR 1.3*  --   --   CREATININE 4.93*  --  5.02*  TROPONINIHS 22* 25*  --     Estimated Creatinine Clearance: 7.9 mL/min (A) (by C-G formula based on SCr of 5.02 mg/dL (H)).   Medical History: Past Medical History:  Diagnosis Date   Anemia    CAD S/P PCI pRCA Promus DES 3.5 x 16 (4.1 mm), ostRPDA Promus DES 2.5 x 12 (2.7 mm) 03/25/2016   CAD S/P percutaneous coronary angioplasty    a. 07/2015 MV: No ischemia, EF 66%;  b. 03/2016 Inflat STEMI/PCI: LM nl, LAD 25d, RI nl, LCX nl, OM1/2 nl, RCA 80p (3.5x16 Promus Premier DES), 50p/m, 30d, RPDA 90 (2.5x12 Promu Premier DES); c. 01/2018 Cath ([place], [place]): Patent RCA stents->Med Rx.   CHF (congestive heart failure) ([place])    CKD (chronic kidney disease), stage III ([place])    Diabetes mellitus without complication ([place])    Diastolic dysfunction    a. 10/2013 Echo: EF 55-65%, Gr1 DD; b. 01/2018 Echo ([place], [place]): EF 55-60%, Gr2 DD, RVSP 101mHg.   Diverticulitis    Dyspnea    Endometriosis    Family history of adverse reaction to anesthesia    Mother - had to stay in ICU because of breathing difficulties every time she had anesthesia   GERD  (gastroesophageal reflux disease)    GI bleed    a. 06/2021 - melena-->2 u PRBCs.   Hiatal hernia    History of colon polyps 08/1994   History of Migraines    Hypercholesterolemia    Hypertension    LBBB (left bundle branch block)    Osteoporosis    PAF (paroxysmal atrial fibrillation) ([place]    a. 03/2016 Dx @ time of MI; b. CHA2DS2VASc = 5-->Eliquis.   Rheumatic fever    Sleep apnea    CPAP   Spasmodic dysphonia    ST elevation myocardial infarction (STEMI) of inferolateral wall, initial episode of care ([place] 03/25/2016   ST elevation myocardial infarction (STEMI) of inferolateral wall, initial episode of care ([facility] 03/25/2016   Urine incontinence     Medications:  No prior anticoagulation noted   Assessment: 81y.o. female with medical history significant of ESRD-HD (TTS), hypertension, hyperlipidemia, COPD, GERD, CAD, STEMI, DES stent placement, PAF not on anticoagulants due to GI bleeding, presented to ED 01/09/22 with shortness breath. Found to have elevated d-dimer PTA. NM pulmonary perfusion scan positive for PE.  Goal of Therapy:  Heparin level 0.3-0.7 units/ml Monitor platelets by anticoagulation protocol: Yes   Plan:  Defer initial bolus due  to low Hgb per MD Start heparin infusion at 1100 units/hr Check anti-Xa level in 8 hours and daily while on heparin Continue to monitor H&H and platelets  Dorothe Pea, PharmD, BCPS Clinical Pharmacist   01/11/2022,2:43 PM

## 2022-01-11 NOTE — TOC Progression Note (Signed)
Transition of Care [facility]) - Progression Note    Patient Details  Name: April [name] MRN: [id number] Date of Birth: 07-31-1940  Transition of Care [facility]) CM/SW Contact  Beverly Sessions, RN Phone Number: 01/11/2022, 10:49 AM  Clinical Narrative:     Per MD plan for dc tomorrow after additional testing completed today  [name] at [facility] for HD early tomorrow, then DC, and [facility] to discharge        Expected Discharge Plan and Services           Expected Discharge Date: 01/11/22                                     Social Determinants of Health ([place]) Interventions    Readmission Risk Interventions    11/28/2021   11:17 AM 10/19/2021   12:27 PM  Readmission Risk Prevention Plan  Transportation Screening Complete Complete  PCP or Specialist Appt within 3-5 Days Complete Complete  HRI or [place] Complete Complete  Social Work Consult for [place] [facility] Planning/Counseling Complete Complete  Palliative Care Screening Not Applicable Complete  Medication Review Press photographer) Complete Complete

## 2022-01-11 NOTE — Consult Note (Signed)
   [facility] Inpatient Consult   01/11/2022  April [name] 02/16/1941 [id number]  [facility] [ACO] Patient:  Primary Care Provider:  Einar Pheasant, MD with [place] [facility] Liaison coverage for [facility]  Patient screened for hospitalization with noted extreme high risk score for unplanned readmission risk and  to assess for potential [place] [facility] service needs for post hospital transition for readmission prevention needs.  Review of patient's medical record reveals patient is currently to return to the skilled nursing side of [facility].   Plan:  Continue to follow progress and disposition to assess for post hospital care management needs.  No current [facility] CM needs at this time.  For questions contact:   Natividad Brood, RN BSN [facility] Liaison  [phone] business mobile phone Toll free office [phone]  Fax number: [phone] [email or web address]

## 2022-01-11 NOTE — Progress Notes (Signed)
Pt 3 hour UF only treatment complete w/ no complications. Report to primary RN. Pt alert, no c/o, vss. Start: 0845 End: 1146 2534m fluid removed 72L BVP 78.5kg post standing weight Albumin 25g at start of HD

## 2022-01-11 NOTE — TOC Progression Note (Signed)
Transition of Care [facility]) - Progression Note    Patient Details  Name: April [name] MRN: [id number] Date of Birth: 05-23-1940  Transition of Care [facility]) CM/SW Contact  Beverly Sessions, RN Phone Number: 01/11/2022, 10:11 AM  Clinical Narrative:     DC summary and packet sent to [facility] in the [place] Per Kim at [facility] they will be able to transport today at 2.  Driver will come to room with WC and O2 trank for transport       Expected Discharge Plan and Services           Expected Discharge Date: 01/11/22                                     Social Determinants of Health (SDOH) Interventions    Readmission Risk Interventions    11/28/2021   11:17 AM 10/19/2021   12:27 PM  Readmission Risk Prevention Plan  Transportation Screening Complete Complete  PCP or Specialist Appt within 3-5 Days Complete Complete  HRI or [place] Complete Complete  Social Work Consult for [place] Planning/Counseling Complete Complete  Palliative Care Screening Not Applicable Complete  Medication Review Press photographer) Complete Complete

## 2022-01-11 NOTE — Progress Notes (Signed)
Pt transport to [facility] delayed an hour. Primary staff messaged.

## 2022-01-11 NOTE — Progress Notes (Signed)
[facility]  ROUNDING NOTE   Subjective:   April [name] is a 81 year old female with past medical history including diastolic CHF, anemia, CAD, paroxysmal atrial fibrillation without anticoagulation.  GI bleed.  Chronic kidney disease stage IV.  Patient reports to ED with abnormal labs.  Patient has been admitted for COPD exacerbation ([place]) [J44.1] Symptomatic anemia [D64.9]  Patient is known to our practice and currently receives outpatient dialysis treatments at [facility] on a TTS schedule, supervised by Dr. Candiss Norse.    Patient seen and evaluated during dialysis   HEMODIALYSIS FLOWSHEET:  Blood Flow Rate (mL/min): 400 mL/min Arterial Pressure (mmHg): -120 mmHg Venous Pressure (mmHg): 150 mmHg TMP (mmHg): -11 mmHg Ultrafiltration Rate (mL/min): 1100 mL/min Dialysate Flow Rate (mL/min): 300 ml/min Dialysis Fluid Bolus: Albumin Bolus Amount (mL): 50 mL  Tolerating treatment well   Objective:  Vital signs in last 24 hours:  Temp:  [97.6 F (36.4 C)-98.1 F (36.7 C)] 97.7 F (36.5 C) (09/13 0830) Pulse Rate:  [67-80] 68 (09/13 1045) Resp:  [13-23] 15 (09/13 1045) BP: (96-127)/(35-56) 112/53 (09/13 1045) SpO2:  [92 %-100 %] 100 % (09/13 1045) Weight:  [81.2 kg-82.6 kg] 81.2 kg (09/13 0830)  Weight change: -0.062 kg Filed Weights   01/10/22 0943 01/10/22 1400 01/11/22 0830  Weight: 83.4 kg 82.6 kg 81.2 kg    Intake/Output: I/O last 3 completed shifts: In: 49 [P.O.:540; Blood:362] Out: 2001 [Other:2000; Stool:1]   Intake/Output this shift:  No intake/output data recorded.  Physical Exam: General: NAD  Head: Normocephalic, atraumatic. Moist oral mucosal membranes  Eyes: Anicteric  Lungs:  Clear to auscultation, normal effort, room air  Heart: Irregular rate and rhythm  Abdomen:  Soft, nontender, nondistended  Extremities: 3+ peripheral edema.  Neurologic: Nonfocal, moving all four extremities  Skin: No lesions, dry  Access: Rt chest  permcath    Basic Metabolic Panel: Recent Labs  Lab 01/09/22 1516 01/10/22 0451  NA 136 133*  K 3.7 3.5  CL 103 101  CO2 22 22  GLUCOSE 132* 141*  BUN 72* 78*  CREATININE 4.93* 5.02*  CALCIUM 9.2 8.6*     Liver Function Tests: No results for input(s): "AST", "ALT", "ALKPHOS", "BILITOT", "PROT", "ALBUMIN" in the last 168 hours.  No results for input(s): "LIPASE", "AMYLASE" in the last 168 hours. Recent Labs  Lab 01/09/22 2130  AMMONIA 38*     CBC: Recent Labs  Lab 01/09/22 1516 01/10/22 0451  WBC 13.5* 8.7  HGB 7.2* 7.5*  HCT 23.1* 22.5*  MCV 101.8* 94.1  PLT 162 124*     Cardiac Enzymes: No results for input(s): "CKTOTAL", "CKMB", "CKMBINDEX", "TROPONINI" in the last 168 hours.  BNP: Invalid input(s): "POCBNP"  CBG: No results for input(s): "GLUCAP" in the last 168 hours.   Microbiology: Results for orders placed or performed during the hospital encounter of 01/09/22  SARS Coronavirus 2 by RT PCR (hospital order, performed in [facility] hospital lab) *cepheid single result test* Anterior Nasal Swab     Status: None   Collection Time: 01/09/22  8:55 PM   Specimen: Anterior Nasal Swab  Result Value Ref Range Status   SARS Coronavirus 2 by RT PCR NEGATIVE NEGATIVE Final    Comment: (NOTE) SARS-CoV-2 target nucleic acids are NOT DETECTED.  The SARS-CoV-2 RNA is generally detectable in upper and lower respiratory specimens during the acute phase of infection. The lowest concentration of SARS-CoV-2 viral copies this assay can detect is 250 copies / mL. A negative result does  not preclude SARS-CoV-2 infection and should not be used as the sole basis for treatment or other patient management decisions.  A negative result may occur with improper specimen collection / handling, submission of specimen other than nasopharyngeal swab, presence of viral mutation(s) within the areas targeted by this assay, and inadequate number of viral copies (<250 copies /  mL). A negative result must be combined with clinical observations, patient history, and epidemiological information.  Fact Sheet for Patients:   https://www.patel.info/  Fact Sheet for Healthcare Providers: https://hall.com/  This test is not yet approved or  cleared by the [place] FDA and has been authorized for detection and/or diagnosis of SARS-CoV-2 by FDA under an Emergency Use Authorization (EUA).  This EUA will remain in effect (meaning this test can be used) for the duration of the COVID-19 declaration under Section 564(b)(1) of the Act, 21 U.S.C. section 360bbb-3(b)(1), unless the authorization is terminated or revoked sooner.  Performed at [facility], [address] [place], [place] [address]     Coagulation Studies: Recent Labs    01/09/22 1516  LABPROT 15.8*  INR 1.3*     Urinalysis: No results for input(s): "COLORURINE", "LABSPEC", "PHURINE", "GLUCOSEU", "HGBUR", "BILIRUBINUR", "KETONESUR", "PROTEINUR", "UROBILINOGEN", "NITRITE", "LEUKOCYTESUR" in the last 72 hours.  Invalid input(s): "APPERANCEUR"    Imaging: DG Chest 2 View  Result Date: 01/09/2022 CLINICAL DATA:  Chest pain, shortness of breath EXAM: CHEST - 2 VIEW COMPARISON:  Previous studies including the examination of 12/09/2021 FINDINGS: Transverse diameter of heart is increased. There is soft tissue fullness in the retrocardiac region extending to the medial right lower lung field, most likely large fixed hiatal hernia. There are no signs of alveolar pulmonary edema or focal pulmonary consolidation. There is blunting of both posterior costophrenic angles. There is no pneumothorax. There is interval placement of dialysis catheter with its tip at the junction of superior vena cava and right atrium. IMPRESSION: There are no signs of pulmonary edema are focal pulmonary consolidation. Small bilateral pleural effusions. Fixed hiatal hernia.  Electronically Signed   By: Elmer Picker M.D.   On: 01/09/2022 16:26     Medications:    anticoagulant sodium citrate      amiodarone  200 mg Oral Daily   Chlorhexidine Gluconate Cloth  6 each Topical Q0600   docusate sodium  100 mg Oral BID   doxycycline  100 mg Oral Q12H   feeding supplement (PRO-STAT SUGAR FREE 64)  30 mL Oral TID WC   hydrocortisone cream  1 Application Topical QID   ipratropium-albuterol  3 mL Nebulization TID   lactulose  20 g Oral BID   lidocaine  2 patch Transdermal Q24H   multivitamin with minerals  1 tablet Oral Daily   pantoprazole  40 mg Oral BID   predniSONE  40 mg Oral Q breakfast   rifaximin  200 mg Oral BID   torsemide  40 mg Oral Once per day on Mon Wed Fri   acetaminophen, albuterol, alteplase, anticoagulant sodium citrate, dextromethorphan-guaiFENesin, diclofenac Sodium, heparin, hydrALAZINE, lidocaine (PF), lidocaine-prilocaine, nitroGLYCERIN, ondansetron (ZOFRAN) IV, pentafluoroprop-tetrafluoroeth, traMADol  Assessment/ Plan:  April [name] is a 81 y.o.  female with past medical history including diastolic CHF, anemia, CAD, paroxysmal atrial fibrillation without anticoagulation.  GI bleed.  Chronic kidney disease stage IV.  Patient reports to ED with complaints of lower extremity edema.  Patient has been admitted for COPD exacerbation ([place]) [J44.1] Symptomatic anemia [D64.9]  [facility]/TTS/right chest PermCath  End-stage renal disease on hemodialysis.  Will maintain outpatient schedule if possible.    Received dialysis yesterday, UF 2L achieved. Returned today for UF only treatment with UF goal 2-2.5L as tolerated. Next treatment scheduled for Thursday to maintain outpatient schedule.   Anemia of chronic kidney disease Lab Results  Component Value Date   HGB 7.5 (L) 01/10/2022   Symptomatic anemia reported on admission.  Patient denies rectal bleeding or dark stools.  1 unit blood transfusion received in the ED.   Patient receives [place] outpatient.    No bleeding reported since admission.    3. Chronic diastolic heart failure. ECHO from 12/08/21 shows EF 55-60% with a Grade II diastolic dysfunction with a mildly mitral valve regurgitation.  Torsemide '40mg'$  continued today.     4. Secondary Hyperparathyroidism: Presumed Lab Results  Component Value Date   CALCIUM 8.6 (L) 01/10/2022   CAION 1.17 03/25/2016   PHOS 3.8 12/19/2021  We will continue to monitor bone minerals.   LOS: 1 Cheryllynn Sarff 9/13/202310:51 AM

## 2022-01-11 NOTE — Progress Notes (Signed)
Pre HD RN assessment 

## 2022-01-11 NOTE — Discharge Summary (Addendum)
Physician Discharge Summary  April [name] EHM:[id number] DOB: 1941/02/16 DOA: 01/09/2022  PCP: Einar Pheasant, MD  Admit date: 01/09/2022 Discharge date: 01/13/2022  Admitted From: SNF Disposition:  SNF  Discharge Condition:Stable CODE STATUS: DNR Diet recommendation: Heart Healthy   Brief/Interim Summary:  April [name] is a 81 y.o. female with medical history significant of ESRD-HD (TTS), hypertension, hyperlipidemia, COPD, GERD, CAD, STEMI, DES stent placement, PAF not on anticoagulants due to GI bleeding, liver cirrhosis, left bundle blockade, migraine, dCHF, OSA on CPAP, who presents with shortness breath for last several days, associated with cough with little mucus production.  She was also experiencing some wheezing, no chest pain, fever or chills.  Chest x-ray on admission did not show any pneumonia, pulmonary edema.  Patient was suspected to have COPD exacerbation and will be started on steroids, doxycycline.  She was also given a unit of PRBC transfusion during this hospitalization.  Respiratory status has improved but she was currently requiring 2 L of oxygen per minute.  She was planned for discharge to SNF on 01/11/2022 but her PCP called and mentioned that her recent D-dimer was elevated( lab report  is not in our system).  VQ scan ordered which was positive for PE.  CT angiogram showed near occlusive pulmonary embolism in a lingular pulmonary artery without evidence of right heart strain.  Discharge canceled.  Started on heparin drip but had to be stopped  due to anemia, she is not  a candidate for anticoagulation.  Palliaitve care consulted for goals of care.  Current plan is to continue without anticoagulation.  Hemodynamically stable for discharge to skilled nursing facility today.  Following  problems were addressed during her hospitalization:  Acute PE: .She was planned for discharge to SNF on 01/11/2022 but her PCP called and mentioned that her recent D-dimer was elevated(  lab report  is not in our system).  VQ scan ordered which was positive for PE.  CT angiogram showed near occlusive pulmonary embolism in a lingular pulmonary artery without evidence of right heart strain.  Started on heparin drip but had to be stopped  due to anemia, she is not  a candidate for anticoagulation.  Thoroughly discussed this with the patient and her son and she agrees not to be in anticoagulation due to high risk of bleeding.   Palliative care consulted for goals of care.  CODE STATUS is DNR.  She will follow-up with palliative care as an outpatient.   COPD exacerbation /acute hypoxic respiratory failure: Patient presented with cough, wheezing  indicating COPD exacerbation.  Chest x-ray was negative for infiltration or pulmonary edema. Wheezing improved.  COVID 19 PCR negative. She was started on steroids,bronchodilators -She is not on any inhalers at baseline.  Started on albuterol.  Can follow-up with pulmonology as an outpatient for PFT -Not on oxygen at baseline, currently requiring intermittent 2L   Sepsis  Patient met criteria for sepsis with WBC 13.5, RR 31 on admission Lactic acid elevated at 2.7, trended down. Procalcitonin at 0.56 On  doxycycline for 5 days course for COPD .blood cultures have not shown any growth.  Sepsis ruled out   Normocytic Anemia Baseline hemoglobin 8.5.  Hb dropped to 6 ,she was  given 1 units of PRBC here. Anemia panel without any significant deficiency. Most likely anemia of chronic kidney disease or chronic occult GI bleed. She also has history of chronic black stools.  She has been seen by GI and extensive evaluation was done including endoscopy, colonoscopy  and capsule endoscopy without finding of any source of bleeding.  We recommend GI follow-up as an outpatient,Dr Wohl.  Check hemoglobin in the next 3 to 4 days   History of liver cirrhosis/hepatic encephalopathy: She was admitted on August this year, her Emina level was up to 159.  She  was started on lactulose and rifaximin.  Imagings have shown evidence of cirrhosis    Chronic diastolic CHF  2D echo on 3/34/3568 showed EF of 55-60% with grade 2 diastolic dysfunction.  Patient has 1+ leg edema, but no JVD, no pulmonary edema on chest x-ray.  -Continue home torsemide -Fluid management per renal by dialysis   CAD S/P PCI pRCA Promus DES 3.5 x 16 (4.1 mm), ostRPDA Promus DES 2.5 x 12 (2.7 mm) No anginal symptoms   ESRD on dialysis Continue dialysis as scheduled   HTN  -Blood pressure stable   Hyperlipidemia Patient is not taking medications currently -Follow-up with PCP   Paroxysmal atrial fibrillation ([place])  Patient is not taking anticoagulants due to  H/O GI bleeding -Amiodarone to be continued  Goals of care: Patient seen by palliative care during this hospitalization.  She has history of multiple admissions in the past.  Has multiple comorbidities.  CODE STATUS DNR .we recommend outpatient palliative care follow-up       Discharge Diagnoses:  Principal Problem:   COPD exacerbation ([employer]) Active Problems:   Sepsis ([place])   Anemia in ESRD (end-stage renal disease) ([facility])   Symptomatic anemia   CAD S/P PCI pRCA Promus DES 3.5 x 16 (4.1 mm), ostRPDA Promus DES 2.5 x 12 (2.7 mm)   Chronic diastolic CHF (congestive heart failure) ([facility])   ESRD on dialysis ([facility])   Cirrhosis ([facility])   HTN (hypertension)   Hyperlipidemia   OSA on CPAP   Paroxysmal atrial fibrillation [facility])    Discharge Instructions  Discharge Instructions     Diet - low sodium heart healthy   Complete by: As directed    Discharge instructions   Complete by: As directed    1)Please take prescribed medications as instructed 2)Do a CBC and BMP tests in 2-3 days 3)Follow up with your gastroenterologist as an outpatient 4)Follow up with outpatient palliative care   Increase activity slowly   Complete by: As directed    No wound care   Complete by: As directed       Allergies as of  01/13/2022       Reactions   Penicillins Other (See Comments)   Okay to take amoxicillin/(pt does not recall what the reaction to penicillin was (67-74 years old)   Sulfa Antibiotics Rash        Medication List     TAKE these medications    acetaminophen 325 MG tablet Commonly known as: TYLENOL Take 650 mg by mouth every 4 (four) hours as needed for mild pain or fever.   albuterol 108 (90 Base) MCG/ACT inhaler Commonly known as: VENTOLIN HFA Inhale 2 puffs into the lungs every 6 (six) hours as needed for wheezing or shortness of breath.   amiodarone 200 MG tablet Commonly known as: PACERONE Take 1 tablet (200 mg total) by mouth daily.   diclofenac Sodium 1 % Gel Commonly known as: VOLTAREN Apply 2 g topically 3 (three) times daily as needed (pain in legs and elbows).   docusate sodium 100 MG capsule Commonly known as: Stool Softener Take 1 capsule (100 mg total) by mouth 2 (two) times daily.   doxycycline 100 MG tablet  Commonly known as: VIBRA-TABS Take 1 tablet (100 mg total) by mouth every 12 (twelve) hours for 1 day.   feeding supplement (PRO-STAT SUGAR FREE 64) Liqd Take 30 mLs by mouth 3 (three) times daily with meals.   hydrocortisone cream 1 % Apply 1 Application topically 4 (four) times daily.   ipratropium-albuterol 0.5-2.5 (3) MG/3ML Soln Commonly known as: DUONEB Take 3 mLs by nebulization every 6 (six) hours as needed.   lactulose 10 GM/15ML solution Commonly known as: CHRONULAC Take 30 mLs (20 g total) by mouth 2 (two) times daily.   lidocaine 5 % Commonly known as: LIDODERM Place 2 patches onto the skin daily. Remove & Discard patch within 12 hours or as directed by MD   multivitamin with minerals tablet Take 1 tablet by mouth daily.   nitroGLYCERIN 0.4 MG SL tablet Commonly known as: Nitrostat Place 1 tablet (0.4 mg total) under the tongue every 5 (five) minutes as needed for chest pain.   pantoprazole 40 MG tablet Commonly known as:  PROTONIX Take 1 tablet (40 mg total) by mouth 2 (two) times daily.   predniSONE 20 MG tablet Commonly known as: DELTASONE Take 2 tablets (40 mg total) by mouth daily with breakfast for 4 days. Start taking on: January 14, 2022   rifaximin 200 MG tablet Commonly known as: XIFAXAN Take 1 tablet (200 mg total) by mouth 2 (two) times daily.   Torsemide 40 MG Tabs Take 40 mg by mouth 3 (three) times a week.        Follow-up Information     Einar Pheasant, MD. Go in 1 week(s).   Specialty: Internal Medicine Why: Sept 14th at [facility] information: [address] [place] [address] [phone]                Allergies  Allergen Reactions   Penicillins Other (See Comments)    Okay to take amoxicillin/(pt does not recall what the reaction to penicillin was (10-77 years old)    Sulfa Antibiotics Rash    Consultations: Nephrology   Procedures/Studies: CT Angio Chest Pulmonary Embolism (PE) W or WO Contrast  Result Date: 01/11/2022 CLINICAL DATA:  Shortness of breath for several days. EXAM: CT ANGIOGRAPHY CHEST WITH CONTRAST TECHNIQUE: Multidetector CT imaging of the chest was performed using the standard protocol during bolus administration of intravenous contrast. Multiplanar CT image reconstructions and MIPs were obtained to evaluate the vascular anatomy. RADIATION DOSE REDUCTION: This exam was performed according to the departmental dose-optimization program which includes automated exposure control, adjustment of the mA and/or kV according to patient size and/or use of iterative reconstruction technique. CONTRAST:  21m OMNIPAQUE IOHEXOL 350 MG/ML SOLN COMPARISON:  Same day nuclear perfusion scan, CT chest 09/19/2021 FINDINGS: Cardiovascular: There is adequate opacification of the pulmonary arteries to the segmental level. There is near occlusive pulmonary embolism in a segmental artery in the lingula (5-154, 7-60). No other pulmonary  embolism is seen. There is no evidence of right heart strain. There is calcified atherosclerotic plaque throughout the thoracic aorta. The heart size is normal. There is no pericardial effusion. Coronary artery calcifications are noted. A right-sided dialysis catheter is in place terminating in the right atrium. Mediastinum/Nodes: The imaged thyroid is unremarkable. The esophagus is grossly unremarkable. There is a moderate-to-large hiatal hernia containing much of the stomach. Is no mediastinal, hilar, or axillary lymphadenopathy. Lungs/Pleura: There are moderate-sized bilateral pleural effusions with adjacent atelectasis in the lower lobes, left worse than right. There is no other  focal consolidation. There is no overt pulmonary edema. There is no pneumothorax. There are no suspicious nodules. Upper Abdomen: The liver is cirrhotic in morphology. There is small to moderate volume upper abdominal ascites. Musculoskeletal: There is no acute osseous abnormality or suspicious osseous lesion. Review of the MIP images confirms the above findings. IMPRESSION: 1. Near occlusive pulmonary embolism in a lingular pulmonary artery without evidence of right heart strain. 2. Moderate-sized bilateral pleural effusions with adjacent atelectasis, left worse than right. 3. Cirrhotic morphology of the liver with small to moderate volume upper abdominal ascites. 4. Moderate-to-large hiatal hernia containing much of the stomach. These results will be called to the ordering clinician or representative by the Radiologist Assistant, and communication documented in the PACS or [employer]. Electronically Signed   By: Valetta Mole M.D.   On: 01/11/2022 16:43   ECHOCARDIOGRAM COMPLETE  Result Date: 01/11/2022    ECHOCARDIOGRAM REPORT   Patient Name:   April [name] Date of Exam: 01/11/2022 Medical Rec #:  [id number]       Height:       60.0 in Accession #:    [id number]      Weight:       173.1 lb Date of Birth:  [birth year]        BSA:          1.755 m Patient Age:    82 years        BP:           124/51 mmHg Patient Gender: F               HR:           72 bpm. Exam Location:  [facility] Procedure: 2D Echo, Cardiac Doppler and Color Doppler Indications:     Pulmonary embolus  History:         Patient has prior history of Echocardiogram examinations, most                  recent 12/08/2021. CHF, CAD, Arrythmias:Atrial Fibrillation;                  Signs/Symptoms:Dyspnea.  Sonographer:     Wenda Low Referring Phys:  Shelly Coss Diagnosing Phys: Kate Sable MD IMPRESSIONS  1. Left ventricular ejection fraction, by estimation, is 55 to 60%. The left ventricle has normal function. The left ventricle has no regional wall motion abnormalities. Left ventricular diastolic parameters are indeterminate.  2. Right ventricular systolic function is normal. The right ventricular size is normal. There is moderately elevated pulmonary artery systolic pressure.  3. Left atrial size was severely dilated.  4. Right atrial size was mildly dilated.  5. The mitral valve is normal in structure. Mild to moderate mitral valve regurgitation.  6. Tricuspid valve regurgitation is moderate.  7. The aortic valve is tricuspid. Aortic valve regurgitation is not visualized.  8. The inferior vena cava is normal in size with <50% respiratory variability, suggesting right atrial pressure of 8 mmHg. FINDINGS  Left Ventricle: Left ventricular ejection fraction, by estimation, is 55 to 60%. The left ventricle has normal function. The left ventricle has no regional wall motion abnormalities. The left ventricular internal cavity size was normal in size. There is  no left ventricular hypertrophy. Left ventricular diastolic parameters are indeterminate. Right Ventricle: The right ventricular size is normal. No increase in right ventricular wall thickness. Right ventricular systolic function is normal. There is moderately elevated pulmonary artery systolic pressure. The  tricuspid regurgitant  velocity is 3.22 m/s, and with an assumed right atrial pressure of 10 mmHg, the estimated right ventricular systolic pressure is 88.5 mmHg. Left Atrium: Left atrial size was severely dilated. Right Atrium: Right atrial size was mildly dilated. Pericardium: There is no evidence of pericardial effusion. Mitral Valve: The mitral valve is normal in structure. Mild to moderate mitral valve regurgitation. MV peak gradient, 10.5 mmHg. The mean mitral valve gradient is 4.0 mmHg. Tricuspid Valve: The tricuspid valve is normal in structure. Tricuspid valve regurgitation is moderate. Aortic Valve: The aortic valve is tricuspid. Aortic valve regurgitation is not visualized. Aortic valve mean gradient measures 7.7 mmHg. Aortic valve peak gradient measures 13.9 mmHg. Aortic valve area, by VTI measures 2.81 cm. Pulmonic Valve: The pulmonic valve was normal in structure. Pulmonic valve regurgitation is not visualized. Aorta: The aortic root is normal in size and structure. Venous: The inferior vena cava is normal in size with less than 50% respiratory variability, suggesting right atrial pressure of 8 mmHg. IAS/Shunts: No atrial level shunt detected by color flow Doppler.  LEFT VENTRICLE PLAX 2D LVIDd:         4.90 cm   Diastology LVIDs:         2.70 cm   LV e' lateral:   11.30 cm/s LV PW:         0.90 cm   LV E/e' lateral: 11.7 LV IVS:        0.80 cm LVOT diam:     2.00 cm LV SV:         118 LV SV Index:   67 LVOT Area:     3.14 cm  RIGHT VENTRICLE RV Basal diam:  4.00 cm RV Mid diam:    3.50 cm RV S prime:     13.40 cm/s TAPSE (M-mode): 2.8 cm LEFT ATRIUM             Index        RIGHT ATRIUM           Index LA diam:        3.90 cm 2.22 cm/m   RA Area:     18.40 cm LA Vol (A2C):   76.9 ml 43.81 ml/m  RA Volume:   53.00 ml  30.19 ml/m LA Vol (A4C):   98.6 ml 56.17 ml/m LA Biplane Vol: 90.6 ml 51.62 ml/m  AORTIC VALVE                     PULMONIC VALVE AV Area (Vmax):    2.53 cm      PV Vmax:        1.25 m/s AV Area (Vmean):   2.64 cm      PV Peak grad:  6.2 mmHg AV Area (VTI):     2.81 cm AV Vmax:           186.33 cm/s AV Vmean:          130.667 cm/s AV VTI:            0.419 m AV Peak Grad:      13.9 mmHg AV Mean Grad:      7.7 mmHg LVOT Vmax:         150.00 cm/s LVOT Vmean:        109.667 cm/s LVOT VTI:          0.375 m LVOT/AV VTI ratio: 0.89  AORTA Ao Root diam: 2.60 cm MITRAL VALVE  TRICUSPID VALVE MV Area (PHT): 3.21 cm     TR Peak grad:   41.5 mmHg MV Area VTI:   3.24 cm     TR Vmax:        322.00 cm/s MV Peak grad:  10.5 mmHg MV Mean grad:  4.0 mmHg     SHUNTS MV Vmax:       1.62 m/s     Systemic VTI:  0.37 m MV Vmean:      91.6 cm/s    Systemic Diam: 2.00 cm MV Decel Time: 236 msec MV E velocity: 132.00 cm/s MV A velocity: 120.00 cm/s MV E/A ratio:  1.10 Kate Sable MD Electronically signed by Kate Sable MD Signature Date/Time: 01/11/2022/4:38:00 PM    Final    NM Pulmonary Perfusion  Addendum Date: 01/11/2022   ADDENDUM REPORT: 01/11/2022 14:54 ADDENDUM: Critical Value/emergent results were called by telephone at the time of interpretation on 01/11/2022 at 2:54 pm to provider Konnar Ben [facility] , who verbally acknowledged these results. Electronically Signed   By: Zetta Bills M.D.   On: 01/11/2022 14:54   Result Date: 01/11/2022 CLINICAL DATA:  Positive D-dimer, shortness of breath and atrial fibrillation. EXAM: NUCLEAR MEDICINE PERFUSION LUNG SCAN TECHNIQUE: Perfusion images were obtained in multiple projections after intravenous injection of radiopharmaceutical. Ventilation scans intentionally deferred if perfusion scan and chest x-ray adequate for interpretation during COVID 19 epidemic. RADIOPHARMACEUTICALS:  4.28 mCi Tc-35mMAA IV COMPARISON:  January 11, 2022 FINDINGS: On the comparison chest radiograph there is medial basilar airspace disease bilaterally. There is a wedge-shaped perfusion defect in the LEFT chest in the area of the lingula or anterior LEFT  lower lobe not explain by the presence of airspace disease on the chest radiograph. There is attenuation of perfusion at the lung bases which is non wedge-shaped potentially related to airspace process. IMPRESSION: Positive for pulmonary embolism to lingula or anterior LEFT lower lobe. Mild attenuation of perfusion elsewhere of the lung bases related to underlying airspace process. A call is out to the referring provider to further discuss findings in the above case. Electronically Signed: By: GZetta BillsM.D. On: 01/11/2022 14:24   [place]ous Img Lower Bilateral (DVT)  Result Date: 01/11/2022 CLINICAL DATA:  [id number]: BILATERAL LOWER EXTREMITY VENOUS DOPPLER ULTRASOUND TECHNIQUE: Gray-scale sonography with graded compression, as well as color Doppler and duplex ultrasound were performed to evaluate the lower extremity deep venous systems from the level of the common femoral vein and including the common femoral, femoral, profunda femoral, popliteal and calf veins including the posterior tibial, peroneal and gastrocnemius veins when visible. The superficial great saphenous vein was also interrogated. Spectral Doppler was utilized to evaluate flow at rest and with distal augmentation maneuvers in the common femoral, femoral and popliteal veins. COMPARISON:  None Available. FINDINGS: RIGHT LOWER EXTREMITY Common Femoral Vein: No evidence of thrombus. Normal compressibility, respiratory phasicity and response to augmentation. Saphenofemoral Junction: No evidence of thrombus. Normal compressibility and flow on color Doppler imaging. Profunda Femoral Vein: No evidence of thrombus. Normal compressibility and flow on color Doppler imaging. Femoral Vein: No evidence of thrombus. Normal compressibility, respiratory phasicity and response to augmentation. Popliteal Vein: No evidence of thrombus. Normal compressibility, respiratory phasicity and response to augmentation. Calf Veins: Limited visualization due to edema.  No evidence of thrombus. Normal compressibility and flow on color Doppler imaging. LEFT LOWER EXTREMITY Common Femoral Vein: No evidence of thrombus. Normal compressibility, respiratory phasicity and response to augmentation. Saphenofemoral Junction: No evidence of thrombus. Normal  compressibility and flow on color Doppler imaging. Profunda Femoral Vein: No evidence of thrombus. Normal compressibility and flow on color Doppler imaging. Femoral Vein: No evidence of thrombus. Normal compressibility, respiratory phasicity and response to augmentation. Popliteal Vein: No evidence of thrombus. Normal compressibility, respiratory phasicity and response to augmentation. Calf Veins: Limited visualization due to edema. No evidence of thrombus. Normal compressibility and flow on color Doppler imaging. IMPRESSION: No evidence of deep venous thrombosis in either lower extremity. Electronically Signed   By: Margaretha Sheffield M.D.   On: 01/11/2022 13:39   DG Chest Port 1 View  Result Date: 01/11/2022 CLINICAL DATA:  Dyspnea EXAM: PORTABLE CHEST 1 VIEW COMPARISON:  Previous studies including the examination of 01/09/2022 FINDINGS: Transverse diameter of heart is within normal limits. Central pulmonary vessels are prominent. There are no signs of alveolar pulmonary edema. Soft tissue density seen in the medial right lower lung field corresponds to a fixed hiatal hernia seen in the previous CT done on 09/19/2021. Linear densities are seen in the medial left lower lung field suggesting subsegmental atelectasis. There is blunting of lateral CP angles. There is no pneumothorax. Tip of dialysis catheter is noted at the junction of superior vena cava and right atrium. IMPRESSION: Central pulmonary vessels are prominent without signs of alveolar pulmonary edema. Increased density in medial left lower lung fields suggest subsegmental atelectasis. Possible small bilateral pleural effusions. Electronically Signed   By: Elmer Picker M.D.   On: 01/11/2022 12:46   DG Chest 2 View  Result Date: 01/09/2022 CLINICAL DATA:  Chest pain, shortness of breath EXAM: CHEST - 2 VIEW COMPARISON:  Previous studies including the examination of 12/09/2021 FINDINGS: Transverse diameter of heart is increased. There is soft tissue fullness in the retrocardiac region extending to the medial right lower lung field, most likely large fixed hiatal hernia. There are no signs of alveolar pulmonary edema or focal pulmonary consolidation. There is blunting of both posterior costophrenic angles. There is no pneumothorax. There is interval placement of dialysis catheter with its tip at the junction of superior vena cava and right atrium. IMPRESSION: There are no signs of pulmonary edema are focal pulmonary consolidation. Small bilateral pleural effusions. Fixed hiatal hernia. Electronically Signed   By: Elmer Picker M.D.   On: 01/09/2022 16:26   US Abdomen Limited  Result Date: 12/17/2021 CLINICAL DATA:  Assess for ascites. EXAM: LIMITED ABDOMEN ULTRASOUND FOR ASCITES TECHNIQUE: Limited ultrasound survey for ascites was performed in all four abdominal quadrants. COMPARISON:  12/12/2021. FINDINGS: Small amount of ascites noted in all 4 quadrants similar to the recent prior ultrasound. IMPRESSION: 1. Small amount of ascites without significant change from the study dated 12/12/2021. Electronically Signed   By: Lajean Manes M.D.   On: 12/17/2021 15:30   PERIPHERAL VASCULAR CATHETERIZATION  Result Date: 12/15/2021 See surgical note for result.     Subjective: Patient seen and examined at the bedside today.  Hemodynamically stable.  Sitting in the chair.  She  looks comfortable, on room air.  Denies any worsening shortness of breath or cough  Discharge Exam: Vitals:   01/13/22 0532 01/13/22 0813  BP: (!) 109/50 (!) 112/56  Pulse: 62 66  Resp: 18 18  Temp: 98 F (36.7 C) 98.2 F (36.8 C)  SpO2: 99% 95%   Vitals:   01/12/22 1936  01/13/22 0532 01/13/22 0700 01/13/22 0813  BP: (!) 106/47 (!) 109/50  (!) 112/56  Pulse: 64 62  66  Resp: 19 18  18  Temp: 98.3 F (36.8 C) 98 F (36.7 C)  98.2 F (36.8 C)  TempSrc: Oral Oral    SpO2: 95% 99%  95%  Weight:   77.8 kg   Height:        General: Pt is alert, awake, not in acute distress, chronically ill looking deconditioned pleasant female Cardiovascular: RRR, S1/S2 +, no rubs, no gallops, dialysis catheter on the right chest Respiratory: Diminished air entry on the bases, no wheezing, no rhonchi Abdominal: Soft, NT, ND, bowel sounds + Extremities: Trace bilateral lower extremity edema, no cyanosis    The results of significant diagnostics from this hospitalization (including imaging, microbiology, ancillary and laboratory) are listed below for reference.     Microbiology: Recent Results (from the past 240 hour(s))  SARS Coronavirus 2 by RT PCR (hospital order, performed in [facility] hospital lab) *cepheid single result test* Anterior Nasal Swab     Status: None   Collection Time: 01/09/22  8:55 PM   Specimen: Anterior Nasal Swab  Result Value Ref Range Status   SARS Coronavirus 2 by RT PCR NEGATIVE NEGATIVE Final    Comment: (NOTE) SARS-CoV-2 target nucleic acids are NOT DETECTED.  The SARS-CoV-2 RNA is generally detectable in upper and lower respiratory specimens during the acute phase of infection. The lowest concentration of SARS-CoV-2 viral copies this assay can detect is 250 copies / mL. A negative result does not preclude SARS-CoV-2 infection and should not be used as the sole basis for treatment or other patient management decisions.  A negative result may occur with improper specimen collection / handling, submission of specimen other than nasopharyngeal swab, presence of viral mutation(s) within the areas targeted by this assay, and inadequate number of viral copies (<250 copies / mL). A negative result must be combined with  clinical observations, patient history, and epidemiological information.  Fact Sheet for Patients:   https://www.patel.info/  Fact Sheet for Healthcare Providers: https://hall.com/  This test is not yet approved or  cleared by the [place] FDA and has been authorized for detection and/or diagnosis of SARS-CoV-2 by FDA under an Emergency Use Authorization (EUA).  This EUA will remain in effect (meaning this test can be used) for the duration of the COVID-19 declaration under Section 564(b)(1) of the Act, 21 U.S.C. section 360bbb-3(b)(1), unless the authorization is terminated or revoked sooner.  Performed at [facility], [address] [place], [place] [address]      Labs: BNP (last 3 results) Recent Labs    09/15/21 1321 10/17/21 1534 11/24/21 0505  BNP 363.9* 296.7* 607.3*   Basic Metabolic Panel: Recent Labs  Lab 01/09/22 1516 01/10/22 0451 01/12/22 0420 01/13/22 0430  NA 136 133* 133* 134*  K 3.7 3.5 3.8 3.6  CL 103 101 99 98  CO2 22 22 22 24   GLUCOSE 132* 141* 161* 144*  BUN 72* 78* 65* 52*  CREATININE 4.93* 5.02* 4.36* 3.23*  CALCIUM 9.2 8.6* 8.7* 8.4*  PHOS  --   --  5.5*  --    Liver Function Tests: Recent Labs  Lab 01/12/22 0420  ALBUMIN 2.6*   No results for input(s): "LIPASE", "AMYLASE" in the last 168 hours. Recent Labs  Lab 01/09/22 2130  AMMONIA 38*   CBC: Recent Labs  Lab 01/09/22 1516 01/10/22 0451 01/12/22 0420 01/12/22 1034 01/13/22 0430  WBC 13.5* 8.7 11.5*  --  10.7*  HGB 7.2* 7.5* 6.8* 8.1* 7.3*  HCT 23.1* 22.5* 20.5* 24.1* 21.9*  MCV 101.8* 94.1 96.7  --  94.0  PLT 162 124* 113*  --  94*   Cardiac Enzymes: No results for input(s): "CKTOTAL", "CKMB", "CKMBINDEX", "TROPONINI" in the last 168 hours. BNP: Invalid input(s): "POCBNP" CBG: No results for input(s): "GLUCAP" in the last 168 hours. D-Dimer No results for input(s): "DDIMER" in the last 72 hours. Hgb  A1c No results for input(s): "HGBA1C" in the last 72 hours. Lipid Profile No results for input(s): "CHOL", "HDL", "LDLCALC", "TRIG", "CHOLHDL", "LDLDIRECT" in the last 72 hours. Thyroid function studies No results for input(s): "TSH", "T4TOTAL", "T3FREE", "THYROIDAB" in the last 72 hours.  Invalid input(s): "FREET3" Anemia work up No results for input(s): "VITAMINB12", "FOLATE", "FERRITIN", "TIBC", "IRON", "RETICCTPCT" in the last 72 hours.  Urinalysis    Component Value Date/Time   COLORURINE STRAW (A) 10/17/2021 2030   APPEARANCEUR CLEAR (A) 10/17/2021 2030   LABSPEC 1.005 10/17/2021 2030   PHURINE 6.0 10/17/2021 2030   GLUCOSEU NEGATIVE 10/17/2021 2030   GLUCOSEU NEGATIVE 12/15/2016 1117   HGBUR MODERATE (A) 10/17/2021 2030   BILIRUBINUR NEGATIVE 10/17/2021 2030   KETONESUR NEGATIVE 10/17/2021 2030   PROTEINUR 30 (A) 10/17/2021 2030   UROBILINOGEN 0.2 12/15/2016 1117   NITRITE NEGATIVE 10/17/2021 2030   LEUKOCYTESUR NEGATIVE 10/17/2021 2030   Sepsis Labs Recent Labs  Lab 01/09/22 1516 01/10/22 0451 01/12/22 0420 01/13/22 0430  WBC 13.5* 8.7 11.5* 10.7*   Microbiology Recent Results (from the past 240 hour(s))  SARS Coronavirus 2 by RT PCR (hospital order, performed in [place] hospital lab) *cepheid single result test* Anterior Nasal Swab     Status: None   Collection Time: 01/09/22  8:55 PM   Specimen: Anterior Nasal Swab  Result Value Ref Range Status   SARS Coronavirus 2 by RT PCR NEGATIVE NEGATIVE Final    Comment: (NOTE) SARS-CoV-2 target nucleic acids are NOT DETECTED.  The SARS-CoV-2 RNA is generally detectable in upper and lower respiratory specimens during the acute phase of infection. The lowest concentration of SARS-CoV-2 viral copies this assay can detect is 250 copies / mL. A negative result does not preclude SARS-CoV-2 infection and should not be used as the sole basis for treatment or other patient management decisions.  A negative result may  occur with improper specimen collection / handling, submission of specimen other than nasopharyngeal swab, presence of viral mutation(s) within the areas targeted by this assay, and inadequate number of viral copies (<250 copies / mL). A negative result must be combined with clinical observations, patient history, and epidemiological information.  Fact Sheet for Patients:   https://www.patel.info/  Fact Sheet for Healthcare Providers: https://hall.com/  This test is not yet approved or  cleared by the [place] FDA and has been authorized for detection and/or diagnosis of SARS-CoV-2 by FDA under an Emergency Use Authorization (EUA).  This EUA will remain in effect (meaning this test can be used) for the duration of the COVID-19 declaration under Section 564(b)(1) of the Act, 21 U.S.C. section 360bbb-3(b)(1), unless the authorization is terminated or revoked sooner.  Performed at [facility], [address] [place], [place] [address]     Please note: You were cared for by a hospitalist during your hospital stay. Once you are discharged, your primary care physician will handle any further medical issues. Please note that NO REFILLS for any discharge medications will be authorized once you are discharged, as it is imperative that you return to your primary care physician (or establish a relationship with a primary care physician if you do not have  one) for your post hospital discharge needs so that they can reassess your need for medications and monitor your lab values.    Time coordinating discharge: 40 minutes  SIGNED:   Shelly Coss, MD  [facility] 01/13/2022, 10:30 AM Pager [phone]  If 7PM-7AM, please contact night-coverage [email or web address] Password [email or web address]

## 2022-01-11 NOTE — Progress Notes (Signed)
Post HD RN assessment 

## 2022-01-11 NOTE — Progress Notes (Signed)
*  PRELIMINARY RESULTS* Echocardiogram 2D Echocardiogram has been performed.  April [name] 01/11/2022, 4:14 PM

## 2022-01-11 NOTE — Progress Notes (Addendum)
PT Cancellation Note  Patient Details Name: SAPHRONIA OZDEMIR MRN: [id number] DOB: 06/06/1940   Cancelled Treatment:    Reason Eval/Treat Not Completed: Other (comment). Currently out of room for HD. Will re-attempt, time permitting.  Addendum 1109: Pt now being tested for possible clot and not planning for dc. Will continue to hold until medically stable.   Clydia Nieves 01/11/2022, 9:52 AM Greggory Stallion, PT, DPT, GCS [phone]

## 2022-01-11 NOTE — Progress Notes (Signed)
I was notified by Dr. Ouida Sills that her D-dimer that was done about a week ago was significantly high in the range of 20.  This problem has not been addressed during this hospitalization. We want to rule out venous thromboembolism by doing VQ scan and Doppler of bilateral lower extremities. We will continue our plan for discharge after this tests are resulted

## 2022-01-12 ENCOUNTER — Ambulatory Visit: Payer: Medicare Other | Admitting: Internal Medicine

## 2022-01-12 DIAGNOSIS — D631 Anemia in chronic kidney disease: Secondary | ICD-10-CM

## 2022-01-12 DIAGNOSIS — Z66 Do not resuscitate: Secondary | ICD-10-CM | POA: Diagnosis not present

## 2022-01-12 DIAGNOSIS — Z515 Encounter for palliative care: Secondary | ICD-10-CM | POA: Diagnosis not present

## 2022-01-12 DIAGNOSIS — D649 Anemia, unspecified: Secondary | ICD-10-CM | POA: Diagnosis not present

## 2022-01-12 DIAGNOSIS — J441 Chronic obstructive pulmonary disease with (acute) exacerbation: Secondary | ICD-10-CM | POA: Diagnosis not present

## 2022-01-12 DIAGNOSIS — Z7189 Other specified counseling: Secondary | ICD-10-CM | POA: Diagnosis not present

## 2022-01-12 LAB — RENAL FUNCTION PANEL
Albumin: 2.6 g/dL — ABNORMAL LOW (ref 3.5–5.0)
Anion gap: 12 (ref 5–15)
BUN: 65 mg/dL — ABNORMAL HIGH (ref 8–23)
CO2: 22 mmol/L (ref 22–32)
Calcium: 8.7 mg/dL — ABNORMAL LOW (ref 8.9–10.3)
Chloride: 99 mmol/L (ref 98–111)
Creatinine, Ser: 4.36 mg/dL — ABNORMAL HIGH (ref 0.44–1.00)
GFR, Estimated: 10 mL/min — ABNORMAL LOW (ref >60)
Glucose, Bld: 161 mg/dL — ABNORMAL HIGH (ref 70–99)
Phosphorus: 5.5 mg/dL — ABNORMAL HIGH (ref 2.5–4.6)
Potassium: 3.8 mmol/L (ref 3.5–5.1)
Sodium: 133 mmol/L — ABNORMAL LOW (ref 135–145)

## 2022-01-12 LAB — CBC
HCT: 20.5 % — ABNORMAL LOW (ref 36.0–46.0)
Hemoglobin: 6.8 g/dL — ABNORMAL LOW (ref 12.0–15.0)
MCH: 32.1 pg (ref 26.0–34.0)
MCHC: 33.2 g/dL (ref 30.0–36.0)
MCV: 96.7 fL (ref 80.0–100.0)
Platelets: 113 10*3/uL — ABNORMAL LOW (ref 150–400)
RBC: 2.12 MIL/uL — ABNORMAL LOW (ref 3.87–5.11)
RDW: 21 % — ABNORMAL HIGH (ref 11.5–15.5)
WBC: 11.5 10*3/uL — ABNORMAL HIGH (ref 4.0–10.5)
nRBC: 0 % (ref 0.0–0.2)

## 2022-01-12 LAB — HEMOGLOBIN AND HEMATOCRIT, BLOOD
HCT: 24.1 % — ABNORMAL LOW (ref 36.0–46.0)
Hemoglobin: 8.1 g/dL — ABNORMAL LOW (ref 12.0–15.0)

## 2022-01-12 LAB — PREPARE RBC (CROSSMATCH)

## 2022-01-12 LAB — HEPARIN LEVEL (UNFRACTIONATED)
Heparin Unfractionated: 0.2 IU/mL — ABNORMAL LOW (ref 0.30–0.70)
Heparin Unfractionated: 0.44 IU/mL (ref 0.30–0.70)
Heparin Unfractionated: <0.1 IU/mL — ABNORMAL LOW (ref 0.30–0.70)

## 2022-01-12 MED ORDER — ACETAMINOPHEN 325 MG PO TABS
ORAL_TABLET | ORAL | Status: AC
  Filled 2022-01-12: qty 2

## 2022-01-12 MED ORDER — HEPARIN SODIUM (PORCINE) 1000 UNIT/ML DIALYSIS: 1000.0000 [IU] | INTRAMUSCULAR | Status: DC | PRN

## 2022-01-12 MED ORDER — SODIUM CHLORIDE 0.9% IV SOLUTION: Freq: Once | INTRAVENOUS | Status: AC

## 2022-01-12 MED ORDER — HEPARIN BOLUS VIA INFUSION
1000.0000 [IU] | Freq: Once | INTRAVENOUS | Status: DC
  Filled 2022-01-12: qty 1000

## 2022-01-12 NOTE — Progress Notes (Signed)
PT Cancellation Note  Patient Details Name: GRACEYN FODOR MRN: [id number] DOB: 10/02/1940   Cancelled Treatment:    Reason Eval/Treat Not Completed: Medical issues which prohibited therapy (PT with PE, treated with heparin, given at 1649. Current policy is for PT to hold x24hours following heparin. Will hold for today and follow up for evaluation tomorrow.)   Gwynneth Aliment 01/12/2022, 2:35 PM

## 2022-01-12 NOTE — Progress Notes (Signed)
Mobility Specialist - Progress Note   01/12/22 0935  Mobility  Activity Off unit   Pt Currently out of room for HD. Will re-attempt, another time and date.   Gretchen Short  Mobility Specialist  01/12/22 9:35 AM

## 2022-01-12 NOTE — Progress Notes (Signed)
[facility]  ROUNDING NOTE   Subjective:   April [name] is a 81 year old female with past medical history including diastolic CHF, anemia, CAD, paroxysmal atrial fibrillation without anticoagulation.  GI bleed.  Chronic kidney disease stage IV.  Patient reports to ED with abnormal labs.  Patient has been admitted for COPD exacerbation ([place]) [J44.1] Symptomatic anemia [D64.9]  Patient is known to our practice and currently receives outpatient dialysis treatments at [facility] on a TTS schedule,    Patient seen and evaluated during dialysis   HEMODIALYSIS FLOWSHEET:  Blood Flow Rate (mL/min): 300 mL/min Arterial Pressure (mmHg): -90 mmHg Venous Pressure (mmHg): 170 mmHg TMP (mmHg): 10 mmHg Ultrafiltration Rate (mL/min): 1000 mL/min Dialysate Flow Rate (mL/min): 300 ml/min Dialysis Fluid Bolus: Normal Saline Bolus Amount (mL): 300 mL  Tolerating treatment well ++ edema    Objective:  Vital signs in last 24 hours:  Temp:  [97.5 F (36.4 C)-98.3 F (36.8 C)] 98.3 F (36.8 C) (09/14 1600) Pulse Rate:  [66-81] 70 (09/14 1600) Resp:  [11-26] 12 (09/14 1600) BP: (82-131)/(34-53) 97/34 (09/14 1600) SpO2:  [92 %-100 %] 99 % (09/14 1600) Weight:  [78.3 kg-80.4 kg] 78.3 kg (09/14 1244)  Weight change: -2.2 kg Filed Weights   01/12/22 0438 01/12/22 0836 01/12/22 1244  Weight: 78.4 kg 80.4 kg 78.3 kg    Intake/Output: I/O last 3 completed shifts: In: 324.7 [P.O.:240; I.V.:84.7] Out: 2500 [Other:2500]   Intake/Output this shift:  Total I/O In: 300 [Blood:300] Out: 2200 [Other:2200]  Physical Exam: General: NAD  Head: Normocephalic, atraumatic. Moist oral mucosal membranes  Eyes: Anicteric  Lungs:  Clear to auscultation, normal effort, room air  Heart: Irregular rate and rhythm  Abdomen:  Soft, nontender, nondistended  Extremities: 2-3+ peripheral edema.  Neurologic: Nonfocal, moving all four extremities  Skin: No lesions, dry  Access: Rt chest  permcath    Basic Metabolic Panel: Recent Labs  Lab 01/09/22 1516 01/10/22 0451 01/12/22 0420  NA 136 133* 133*  K 3.7 3.5 3.8  CL 103 101 99  CO2 '22 22 22  '$ GLUCOSE 132* 141* 161*  BUN 72* 78* 65*  CREATININE 4.93* 5.02* 4.36*  CALCIUM 9.2 8.6* 8.7*  PHOS  --   --  5.5*     Liver Function Tests: Recent Labs  Lab 01/12/22 0420  ALBUMIN 2.6*    No results for input(s): "LIPASE", "AMYLASE" in the last 168 hours. Recent Labs  Lab 01/09/22 2130  AMMONIA 38*     CBC: Recent Labs  Lab 01/09/22 1516 01/10/22 0451 01/12/22 0420 01/12/22 1034  WBC 13.5* 8.7 11.5*  --   HGB 7.2* 7.5* 6.8* 8.1*  HCT 23.1* 22.5* 20.5* 24.1*  MCV 101.8* 94.1 96.7  --   PLT 162 124* 113*  --      Cardiac Enzymes: No results for input(s): "CKTOTAL", "CKMB", "CKMBINDEX", "TROPONINI" in the last 168 hours.  BNP: Invalid input(s): "POCBNP"  CBG: No results for input(s): "GLUCAP" in the last 168 hours.   Microbiology: Results for orders placed or performed during the hospital encounter of 01/09/22  SARS Coronavirus 2 by RT PCR (hospital order, performed in [facility] hospital lab) *cepheid single result test* Anterior Nasal Swab     Status: None   Collection Time: 01/09/22  8:55 PM   Specimen: Anterior Nasal Swab  Result Value Ref Range Status   SARS Coronavirus 2 by RT PCR NEGATIVE NEGATIVE Final    Comment: (NOTE) SARS-CoV-2 target nucleic acids are NOT DETECTED.  The SARS-CoV-2 RNA is generally detectable in upper and lower respiratory specimens during the acute phase of infection. The lowest concentration of SARS-CoV-2 viral copies this assay can detect is 250 copies / mL. A negative result does not preclude SARS-CoV-2 infection and should not be used as the sole basis for treatment or other patient management decisions.  A negative result may occur with improper specimen collection / handling, submission of specimen other than nasopharyngeal swab, presence of viral  mutation(s) within the areas targeted by this assay, and inadequate number of viral copies (<250 copies / mL). A negative result must be combined with clinical observations, patient history, and epidemiological information.  Fact Sheet for Patients:   https://www.patel.info/  Fact Sheet for Healthcare Providers: https://hall.com/  This test is not yet approved or  cleared by the [place] FDA and has been authorized for detection and/or diagnosis of SARS-CoV-2 by FDA under an Emergency Use Authorization (EUA).  This EUA will remain in effect (meaning this test can be used) for the duration of the COVID-19 declaration under Section 564(b)(1) of the Act, 21 U.S.C. section 360bbb-3(b)(1), unless the authorization is terminated or revoked sooner.  Performed at [facility], [address] [place], [place] [address]     Coagulation Studies: No results for input(s): "LABPROT", "INR" in the last 72 hours.   Urinalysis: No results for input(s): "COLORURINE", "LABSPEC", "PHURINE", "GLUCOSEU", "HGBUR", "BILIRUBINUR", "KETONESUR", "PROTEINUR", "UROBILINOGEN", "NITRITE", "LEUKOCYTESUR" in the last 72 hours.  Invalid input(s): "APPERANCEUR"    Imaging: CT Angio Chest Pulmonary Embolism (PE) W or WO Contrast  Result Date: 01/11/2022 CLINICAL DATA:  Shortness of breath for several days. EXAM: CT ANGIOGRAPHY CHEST WITH CONTRAST TECHNIQUE: Multidetector CT imaging of the chest was performed using the standard protocol during bolus administration of intravenous contrast. Multiplanar CT image reconstructions and MIPs were obtained to evaluate the vascular anatomy. RADIATION DOSE REDUCTION: This exam was performed according to the departmental dose-optimization program which includes automated exposure control, adjustment of the mA and/or kV according to patient size and/or use of iterative reconstruction technique. CONTRAST:  46m OMNIPAQUE  IOHEXOL 350 MG/ML SOLN COMPARISON:  Same day nuclear perfusion scan, CT chest 09/19/2021 FINDINGS: Cardiovascular: There is adequate opacification of the pulmonary arteries to the segmental level. There is near occlusive pulmonary embolism in a segmental artery in the lingula (5-154, 7-60). No other pulmonary embolism is seen. There is no evidence of right heart strain. There is calcified atherosclerotic plaque throughout the thoracic aorta. The heart size is normal. There is no pericardial effusion. Coronary artery calcifications are noted. A right-sided dialysis catheter is in place terminating in the right atrium. Mediastinum/Nodes: The imaged thyroid is unremarkable. The esophagus is grossly unremarkable. There is a moderate-to-large hiatal hernia containing much of the stomach. Is no mediastinal, hilar, or axillary lymphadenopathy. Lungs/Pleura: There are moderate-sized bilateral pleural effusions with adjacent atelectasis in the lower lobes, left worse than right. There is no other focal consolidation. There is no overt pulmonary edema. There is no pneumothorax. There are no suspicious nodules. Upper Abdomen: The liver is cirrhotic in morphology. There is small to moderate volume upper abdominal ascites. Musculoskeletal: There is no acute osseous abnormality or suspicious osseous lesion. Review of the MIP images confirms the above findings. IMPRESSION: 1. Near occlusive pulmonary embolism in a lingular pulmonary artery without evidence of right heart strain. 2. Moderate-sized bilateral pleural effusions with adjacent atelectasis, left worse than right. 3. Cirrhotic morphology of the liver with small to moderate volume upper abdominal  ascites. 4. Moderate-to-large hiatal hernia containing much of the stomach. These results will be called to the ordering clinician or representative by the Radiologist Assistant, and communication documented in the PACS or [employer]. Electronically Signed   By: Valetta Mole  M.D.   On: 01/11/2022 16:43   ECHOCARDIOGRAM COMPLETE  Result Date: 01/11/2022    ECHOCARDIOGRAM REPORT   Patient Name:   TAHANI POTIER Date of Exam: 01/11/2022 Medical Rec #:  [id number]       Height:       60.0 in Accession #:    [id number]      Weight:       173.1 lb Date of Birth:  April 25, 1941       BSA:          1.755 m Patient Age:    16 years        BP:           124/51 mmHg Patient Gender: F               HR:           72 bpm. Exam Location:  [facility] Procedure: 2D Echo, Cardiac Doppler and Color Doppler Indications:     Pulmonary embolus  History:         Patient has prior history of Echocardiogram examinations, most                  recent 12/08/2021. CHF, CAD, Arrythmias:Atrial Fibrillation;                  Signs/Symptoms:Dyspnea.  Sonographer:     Wenda Low Referring Phys:  Shelly Coss Diagnosing Phys: Kate Sable MD IMPRESSIONS  1. Left ventricular ejection fraction, by estimation, is 55 to 60%. The left ventricle has normal function. The left ventricle has no regional wall motion abnormalities. Left ventricular diastolic parameters are indeterminate.  2. Right ventricular systolic function is normal. The right ventricular size is normal. There is moderately elevated pulmonary artery systolic pressure.  3. Left atrial size was severely dilated.  4. Right atrial size was mildly dilated.  5. The mitral valve is normal in structure. Mild to moderate mitral valve regurgitation.  6. Tricuspid valve regurgitation is moderate.  7. The aortic valve is tricuspid. Aortic valve regurgitation is not visualized.  8. The inferior vena cava is normal in size with <50% respiratory variability, suggesting right atrial pressure of 8 mmHg. FINDINGS  Left Ventricle: Left ventricular ejection fraction, by estimation, is 55 to 60%. The left ventricle has normal function. The left ventricle has no regional wall motion abnormalities. The left ventricular internal cavity size was normal in size. There is  no left  ventricular hypertrophy. Left ventricular diastolic parameters are indeterminate. Right Ventricle: The right ventricular size is normal. No increase in right ventricular wall thickness. Right ventricular systolic function is normal. There is moderately elevated pulmonary artery systolic pressure. The tricuspid regurgitant velocity is 3.22 m/s, and with an assumed right atrial pressure of 10 mmHg, the estimated right ventricular systolic pressure is 86.5 mmHg. Left Atrium: Left atrial size was severely dilated. Right Atrium: Right atrial size was mildly dilated. Pericardium: There is no evidence of pericardial effusion. Mitral Valve: The mitral valve is normal in structure. Mild to moderate mitral valve regurgitation. MV peak gradient, 10.5 mmHg. The mean mitral valve gradient is 4.0 mmHg. Tricuspid Valve: The tricuspid valve is normal in structure. Tricuspid valve regurgitation is moderate. Aortic Valve: The aortic valve is tricuspid.  Aortic valve regurgitation is not visualized. Aortic valve mean gradient measures 7.7 mmHg. Aortic valve peak gradient measures 13.9 mmHg. Aortic valve area, by VTI measures 2.81 cm. Pulmonic Valve: The pulmonic valve was normal in structure. Pulmonic valve regurgitation is not visualized. Aorta: The aortic root is normal in size and structure. Venous: The inferior vena cava is normal in size with less than 50% respiratory variability, suggesting right atrial pressure of 8 mmHg. IAS/Shunts: No atrial level shunt detected by color flow Doppler.  LEFT VENTRICLE PLAX 2D LVIDd:         4.90 cm   Diastology LVIDs:         2.70 cm   LV e' lateral:   11.30 cm/s LV PW:         0.90 cm   LV E/e' lateral: 11.7 LV IVS:        0.80 cm LVOT diam:     2.00 cm LV SV:         118 LV SV Index:   67 LVOT Area:     3.14 cm  RIGHT VENTRICLE RV Basal diam:  4.00 cm RV Mid diam:    3.50 cm RV S prime:     13.40 cm/s TAPSE (M-mode): 2.8 cm LEFT ATRIUM             Index        RIGHT ATRIUM           Index  LA diam:        3.90 cm 2.22 cm/m   RA Area:     18.40 cm LA Vol (A2C):   76.9 ml 43.81 ml/m  RA Volume:   53.00 ml  30.19 ml/m LA Vol (A4C):   98.6 ml 56.17 ml/m LA Biplane Vol: 90.6 ml 51.62 ml/m  AORTIC VALVE                     PULMONIC VALVE AV Area (Vmax):    2.53 cm      PV Vmax:       1.25 m/s AV Area (Vmean):   2.64 cm      PV Peak grad:  6.2 mmHg AV Area (VTI):     2.81 cm AV Vmax:           186.33 cm/s AV Vmean:          130.667 cm/s AV VTI:            0.419 m AV Peak Grad:      13.9 mmHg AV Mean Grad:      7.7 mmHg LVOT Vmax:         150.00 cm/s LVOT Vmean:        109.667 cm/s LVOT VTI:          0.375 m LVOT/AV VTI ratio: 0.89  AORTA Ao Root diam: 2.60 cm MITRAL VALVE                TRICUSPID VALVE MV Area (PHT): 3.21 cm     TR Peak grad:   41.5 mmHg MV Area VTI:   3.24 cm     TR Vmax:        322.00 cm/s MV Peak grad:  10.5 mmHg MV Mean grad:  4.0 mmHg     SHUNTS MV Vmax:       1.62 m/s     Systemic VTI:  0.37 m MV Vmean:      91.6 cm/s    Systemic  Diam: 2.00 cm MV Decel Time: 236 msec MV E velocity: 132.00 cm/s MV A velocity: 120.00 cm/s MV E/A ratio:  1.10 Kate Sable MD Electronically signed by Kate Sable MD Signature Date/Time: 01/11/2022/4:38:00 PM    Final    NM Pulmonary Perfusion  Addendum Date: 01/11/2022   ADDENDUM REPORT: 01/11/2022 14:54 ADDENDUM: Critical Value/emergent results were called by telephone at the time of interpretation on 01/11/2022 at 2:54 pm to provider [facility] , who verbally acknowledged these results. Electronically Signed   By: Zetta Bills M.D.   On: 01/11/2022 14:54   Result Date: 01/11/2022 CLINICAL DATA:  Positive D-dimer, shortness of breath and atrial fibrillation. EXAM: NUCLEAR MEDICINE PERFUSION LUNG SCAN TECHNIQUE: Perfusion images were obtained in multiple projections after intravenous injection of radiopharmaceutical. Ventilation scans intentionally deferred if perfusion scan and chest x-ray adequate for interpretation during  COVID 19 epidemic. RADIOPHARMACEUTICALS:  4.28 mCi Tc-40mMAA IV COMPARISON:  January 11, 2022 FINDINGS: On the comparison chest radiograph there is medial basilar airspace disease bilaterally. There is a wedge-shaped perfusion defect in the LEFT chest in the area of the lingula or anterior LEFT lower lobe not explain by the presence of airspace disease on the chest radiograph. There is attenuation of perfusion at the lung bases which is non wedge-shaped potentially related to airspace process. IMPRESSION: Positive for pulmonary embolism to lingula or anterior LEFT lower lobe. Mild attenuation of perfusion elsewhere of the lung bases related to underlying airspace process. A call is out to the referring provider to further discuss findings in the above case. Electronically Signed: By: GZetta BillsM.D. On: 01/11/2022 14:24   [place]Venous Img Lower Bilateral (DVT)  Result Date: 01/11/2022 CLINICAL DATA:  [id number]: BILATERAL LOWER EXTREMITY VENOUS DOPPLER ULTRASOUND TECHNIQUE: Gray-scale sonography with graded compression, as well as color Doppler and duplex ultrasound were performed to evaluate the lower extremity deep venous systems from the level of the common femoral vein and including the common femoral, femoral, profunda femoral, popliteal and calf veins including the posterior tibial, peroneal and gastrocnemius veins when visible. The superficial great saphenous vein was also interrogated. Spectral Doppler was utilized to evaluate flow at rest and with distal augmentation maneuvers in the common femoral, femoral and popliteal veins. COMPARISON:  None Available. FINDINGS: RIGHT LOWER EXTREMITY Common Femoral Vein: No evidence of thrombus. Normal compressibility, respiratory phasicity and response to augmentation. Saphenofemoral Junction: No evidence of thrombus. Normal compressibility and flow on color Doppler imaging. Profunda Femoral Vein: No evidence of thrombus. Normal compressibility and flow on  color Doppler imaging. Femoral Vein: No evidence of thrombus. Normal compressibility, respiratory phasicity and response to augmentation. Popliteal Vein: No evidence of thrombus. Normal compressibility, respiratory phasicity and response to augmentation. Calf Veins: Limited visualization due to edema. No evidence of thrombus. Normal compressibility and flow on color Doppler imaging. LEFT LOWER EXTREMITY Common Femoral Vein: No evidence of thrombus. Normal compressibility, respiratory phasicity and response to augmentation. Saphenofemoral Junction: No evidence of thrombus. Normal compressibility and flow on color Doppler imaging. Profunda Femoral Vein: No evidence of thrombus. Normal compressibility and flow on color Doppler imaging. Femoral Vein: No evidence of thrombus. Normal compressibility, respiratory phasicity and response to augmentation. Popliteal Vein: No evidence of thrombus. Normal compressibility, respiratory phasicity and response to augmentation. Calf Veins: Limited visualization due to edema. No evidence of thrombus. Normal compressibility and flow on color Doppler imaging. IMPRESSION: No evidence of deep venous thrombosis in either lower extremity. Electronically Signed   By: FJamesetta SoD.  On: 01/11/2022 13:39   DG Chest Port 1 View  Result Date: 01/11/2022 CLINICAL DATA:  Dyspnea EXAM: PORTABLE CHEST 1 VIEW COMPARISON:  Previous studies including the examination of 01/09/2022 FINDINGS: Transverse diameter of heart is within normal limits. Central pulmonary vessels are prominent. There are no signs of alveolar pulmonary edema. Soft tissue density seen in the medial right lower lung field corresponds to a fixed hiatal hernia seen in the previous CT done on 09/19/2021. Linear densities are seen in the medial left lower lung field suggesting subsegmental atelectasis. There is blunting of lateral CP angles. There is no pneumothorax. Tip of dialysis catheter is noted at the junction of  superior vena cava and right atrium. IMPRESSION: Central pulmonary vessels are prominent without signs of alveolar pulmonary edema. Increased density in medial left lower lung fields suggest subsegmental atelectasis. Possible small bilateral pleural effusions. Electronically Signed   By: Elmer Picker M.D.   On: 01/11/2022 12:46     Medications:    anticoagulant sodium citrate      amiodarone  200 mg Oral Daily   Chlorhexidine Gluconate Cloth  6 each Topical Q0600   docusate sodium  100 mg Oral BID   doxycycline  100 mg Oral Q12H   feeding supplement (PRO-STAT SUGAR FREE 64)  30 mL Oral TID WC   hydrocortisone cream  1 Application Topical QID   lactulose  20 g Oral BID   lidocaine  2 patch Transdermal Q24H   multivitamin with minerals  1 tablet Oral Daily   pantoprazole  40 mg Oral BID   predniSONE  40 mg Oral Q breakfast   rifaximin  200 mg Oral BID   torsemide  40 mg Oral Once per day on Mon Wed Fri   acetaminophen, albuterol, alteplase, anticoagulant sodium citrate, dextromethorphan-guaiFENesin, diclofenac Sodium, heparin, hydrALAZINE, lidocaine (PF), lidocaine-prilocaine, nitroGLYCERIN, ondansetron (ZOFRAN) IV, pentafluoroprop-tetrafluoroeth, traMADol  Assessment/ Plan:  Ms. DILARA NAVARRETE is a 81 y.o.  female with past medical history including diastolic CHF, anemia, CAD, paroxysmal atrial fibrillation without anticoagulation.  GI bleed.  Chronic kidney disease stage IV.  Patient reports to ED with complaints of lower extremity edema.  Patient has been admitted for COPD exacerbation ([place]) [J44.1] Symptomatic anemia [D64.9]  [facility]/TTS/right chest PermCath   End-stage renal disease on hemodialysis.  Will maintain outpatient schedule if possible.  Dialysis 3 days in a row.  So far 6700 cc of fluid has been removed in the past 3 days.  Next hemodialysis planned for Saturday.     Anemia of chronic kidney disease Lab Results  Component Value Date   HGB 8.1 (L)  01/12/2022   Symptomatic anemia reported on admission.  Patient denies rectal bleeding or dark stools.  1 unit blood transfusion received in the ED.  Patient receives [place] outpatient.   -Hemoglobin dropped to 6.8 today.  Received blood transfusion during dialysis.   3. Chronic diastolic heart failure. ECHO from 12/08/21 shows EF 55-60% with a Grade II diastolic dysfunction with a mildly mitral valve regurgitation.  Torsemide '40mg'$  continued  on nondialysis days.    4.  Bone and mineral metabolism assessment Lab Results  Component Value Date   CALCIUM 8.7 (L) 01/12/2022   CAION 1.17 03/25/2016   PHOS 5.5 (H) 01/12/2022  We will continue to monitor    5.  Chronic severe lower extremity edema Volume removal with dialysis as tolerated.  See above.   LOS: 2 Velena Keegan Candiss [place] 9/14/20234:11 PM

## 2022-01-12 NOTE — Progress Notes (Signed)
PROGRESS NOTE  April [name]  YBW:[id number] DOB: 1941/01/02 DOA: 01/09/2022 PCP: Einar Pheasant, MD   Brief Narrative: April [name] is a 81 y.o. female with medical history significant of ESRD-HD (TTS), hypertension, hyperlipidemia, COPD, GERD, CAD, STEMI, DES stent placement, PAF not on anticoagulants due to GI bleeding, liver cirrhosis, left bundle blockade, migraine, dCHF, OSA on CPAP, who presents with shortness breath for last several days, associated with cough with little mucus production.  She was also experiencing some wheezing, no chest pain, fever or chills.  Chest x-ray on admission did not show any pneumonia, pulmonary edema.  Patient was suspected to have COPD exacerbation and will be started on steroids, doxycycline.  She was also given a unit of PRBC transfusion during this hospitalization.  Respiratory status has improved but she was currently requiring 2 L of oxygen per minute.  She was planned for discharge to SNF on 01/11/2022 but her PCP called and mentioned that her recent D-dimer was elevated( lab report  is not in our system).  VQ scan ordered which was positive for PE.  CT angiogram showed near occlusive pulmonary embolism in a lingular pulmonary artery without evidence of right heart strain.  Discharge canceled.  Started on heparin drip but had to be stopped  due to anemia, she is not  a candidate for anticoagulation.  Palliaitve care consulted for goals of care.  Assessment & Plan:  Principal Problem:   COPD exacerbation ([place]) Active Problems:   Sepsis ([place])   Anemia in ESRD (end-stage renal disease) (HCC)   Symptomatic anemia   CAD S/P PCI pRCA Promus DES 3.5 x 16 (4.1 mm), ostRPDA Promus DES 2.5 x 12 (2.7 mm)   Chronic diastolic CHF (congestive heart failure) (HCC)   ESRD on dialysis (HCC)   Cirrhosis (HCC)   HTN (hypertension)   Hyperlipidemia   OSA on CPAP   Paroxysmal atrial fibrillation (HCC)  Acute PE: .She was planned for discharge to SNF on  01/11/2022 but her PCP called and mentioned that her recent D-dimer was elevated( lab report  is not in our system).  VQ scan ordered which was positive for PE.  CT angiogram showed near occlusive pulmonary embolism in a lingular pulmonary artery without evidence of right heart strain.  Started on heparin drip but had to be stopped  due to anemia, she is not  a candidate for anticoagulation.  Thoroughly discussed this with the patient and her son and she agrees not to be in anticoagulation due to high risk of bleeding.   Palliative care consulted for goals of care.   COPD exacerbation /acute hypoxic respiratory failure: Patient presented with cough, wheezing  indicating COPD exacerbation.  Chest x-ray was negative for infiltration or pulmonary edema. Wheezing improved.  COVID 19 PCR negative. She was started on steroids,bronchodilators -She is not on any inhalers at baseline.  Started on albuterol.  Can follow-up with pulmonology as an outpatient for PFT -Not on oxygen at baseline, currently requiring 2L   Sepsis  Patient met criteria for sepsis with WBC 13.5, RR 31 on admission Lactic acid elevated at 2.7, trended down. Procalcitonin at 0.56 On  doxycycline for r 5 days course for COPD .blood cultures have not shown any growth.  Sepsis ruled out   Normocytic Anemia/ESRD Baseline hemoglobin 8.5.  Hb dropped to 6 today.She was given 2 units of PRBC here. Anemia panel without any significant deficiency. Most likely anemia of chronic kidney disease. She also has history of black stools.  She has been seen by GI and extensive evaluation was done including endoscopy, colonoscopy and endoscopy without finding of any source of bleeding.  We recommend GI follow-up as an outpatient  History of liver cirrhosis/hepatic encephalopathy: She was admitted on August this year, her [place] level was up to 159.  She was started on lactulose and rifaximin.  Imagings have shown evidence of cirrhosis    Chronic  diastolic CHF  2D echo on 9/77/4142 showed EF of 55-60% with grade 2 diastolic dysfunction.  Patient has 1+ leg edema, but no JVD, no pulmonary edema on chest x-ray.  -Continue home torsemide -Fluid management per renal by dialysis   CAD S/P PCI pRCA Promus DES 3.5 x 16 (4.1 mm), ostRPDA Promus DES 2.5 x 12 (2.7 mm) No anginal symptoms  ESRD on dialysis Continue dialysis as scheduled   HTN  -Blood pressure stable   Hyperlipidemia Patient is not taking medications currently -Follow-up with PCP   Paroxysmal atrial fibrillation ([place])  Patient is not taking anticoagulants due to  H/O GI bleeding -Amiodarone to be continued         DVT prophylaxis:SCDs Start: 01/09/22 1817     Code Status: DNR  Family Communication: Son on phone on 9/14  Patient status:Inpatient  Patient is from :SNF  Anticipated discharge to:SNF  Estimated DC date:likely tomorrow, waiting for palliative care evaluation   Consultants: Nephrology, palliative care  Procedures: Dialysis  Antimicrobials:  Anti-infectives (From admission, onward)    Start     Dose/Rate Route Frequency Ordered Stop   01/11/22 0000  doxycycline (VIBRA-TABS) 100 MG tablet        100 mg Oral Every 12 hours 01/11/22 0957 01/14/22 2359   01/10/22 1000  azithromycin (ZITHROMAX) tablet 250 mg  Status:  Discontinued       See Hyperspace for full Linked Orders Report.   250 mg Oral Daily 01/09/22 1814 01/09/22 1840   01/09/22 2200  rifaximin (XIFAXAN) tablet 200 mg        200 mg Oral 2 times daily 01/09/22 1835     01/09/22 1845  doxycycline (VIBRA-TABS) tablet 100 mg        100 mg Oral Every 12 hours 01/09/22 1840 01/14/22 2159   01/09/22 1815  azithromycin (ZITHROMAX) tablet 500 mg  Status:  Discontinued       See Hyperspace for full Linked Orders Report.   500 mg Oral Daily 01/09/22 1814 01/09/22 1840       Subjective:  Patient seen and examined at the bedside at dialysis suite.  Remains hemodynamically stable.   Overall comfortable.  No worsening shortness of breath or cough or chest pain.  On 2 L of oxygen per minute.  Long discussion had at the bedside about the finding of VQ scan, CT angiogram.  She agrees that she is high risk for bleeding  if she is put on anticoagulation and agrees to be monitored without anticoagulation  Objective: Vitals:   01/12/22 1055 01/12/22 1100 01/12/22 1115 01/12/22 1130  BP: (!) 101/48 (!) 111/44 (!) 117/49 (!) 106/39  Pulse: 76 70 71 78  Resp: _0 Temp:      TempSrc:      SpO2: 99%  100%   Weight:      Height:        Intake/Output Summary (Last 24 hours) at 01/12/2022 1134 Last data filed at 01/12/2022 1025 Gross per 24 hour  Intake 624.67 ml  Output 2500 ml  Net -1875.33  ml   Filed Weights   01/11/22 1150 01/12/22 0438 01/12/22 0836  Weight: 78.5 kg 78.4 kg 80.4 kg    Examination:  General exam: Overall comfortable, not in distress, pleasant elderly female HEENT: PERRL Respiratory system: Diminished sounds bilaterally, no wheezes or crackles  Cardiovascular system: S1 & S2 heard, RRR.  Dialysis catheter on the right chest Gastrointestinal system: Abdomen is nondistended, soft and nontender. Central nervous system: Alert and oriented Extremities: trace bilateral LE  edema, no clubbing ,no cyanosis Skin: No rashes, no ulcers,no icterus     Data Reviewed: I have personally reviewed following labs and imaging studies  CBC: Recent Labs  Lab 01/09/22 1516 01/10/22 0451 01/12/22 0420 01/12/22 1034  WBC 13.5* 8.7 11.5*  --   HGB 7.2* 7.5* 6.8* 8.1*  HCT 23.1* 22.5* 20.5* 24.1*  MCV 101.8* 94.1 96.7  --   PLT 162 124* 113*  --    Basic Metabolic Panel: Recent Labs  Lab 01/09/22 1516 01/10/22 0451 01/12/22 0420  NA 136 133* 133*  K 3.7 3.5 3.8  CL 103 101 99  CO2 _0 GLUCOSE 132* 141* 161*  BUN 72* 78* 65*  CREATININE 4.93* 5.02* 4.36*  CALCIUM 9.2 8.6* 8.7*  PHOS  --   --  5.5*     Recent Results (from the past  240 hour(s))  SARS Coronavirus 2 by RT PCR (hospital order, performed in [facility] hospital lab) *cepheid single result test* Anterior Nasal Swab     Status: None   Collection Time: 01/09/22  8:55 PM   Specimen: Anterior Nasal Swab  Result Value Ref Range Status   SARS Coronavirus 2 by RT PCR NEGATIVE NEGATIVE Final    Comment: (NOTE) SARS-CoV-2 target nucleic acids are NOT DETECTED.  The SARS-CoV-2 RNA is generally detectable in upper and lower respiratory specimens during the acute phase of infection. The lowest concentration of SARS-CoV-2 viral copies this assay can detect is 250 copies / mL. A negative result does not preclude SARS-CoV-2 infection and should not be used as the sole basis for treatment or other patient management decisions.  A negative result may occur with improper specimen collection / handling, submission of specimen other than nasopharyngeal swab, presence of viral mutation(s) within the areas targeted by this assay, and inadequate number of viral copies (<250 copies / mL). A negative result must be combined with clinical observations, patient history, and epidemiological information.  Fact Sheet for Patients:   https://www.patel.info/  Fact Sheet for Healthcare Providers: https://hall.com/  This test is not yet approved or  cleared by the [place] FDA and has been authorized for detection and/or diagnosis of SARS-CoV-2 by FDA under an Emergency Use Authorization (EUA).  This EUA will remain in effect (meaning this test can be used) for the duration of the COVID-19 declaration under Section 564(b)(1) of the Act, 21 U.S.C. section 360bbb-3(b)(1), unless the authorization is terminated or revoked sooner.  Performed at [facility], [address], [place], [place] [address]      Radiology Studies: CT Angio Chest Pulmonary Embolism (PE) W or WO Contrast  Result Date: 01/11/2022 CLINICAL DATA:   Shortness of breath for several days. EXAM: CT ANGIOGRAPHY CHEST WITH CONTRAST TECHNIQUE: Multidetector CT imaging of the chest was performed using the standard protocol during bolus administration of intravenous contrast. Multiplanar CT image reconstructions and MIPs were obtained to evaluate the vascular anatomy. RADIATION DOSE REDUCTION: This exam was performed according to the departmental dose-optimization program which includes  automated exposure control, adjustment of the mA and/or kV according to patient size and/or use of iterative reconstruction technique. CONTRAST:  15m OMNIPAQUE IOHEXOL 350 MG/ML SOLN COMPARISON:  Same day nuclear perfusion scan, CT chest 09/19/2021 FINDINGS: Cardiovascular: There is adequate opacification of the pulmonary arteries to the segmental level. There is near occlusive pulmonary embolism in a segmental artery in the lingula (5-154, 7-60). No other pulmonary embolism is seen. There is no evidence of right heart strain. There is calcified atherosclerotic plaque throughout the thoracic aorta. The heart size is normal. There is no pericardial effusion. Coronary artery calcifications are noted. A right-sided dialysis catheter is in place terminating in the right atrium. Mediastinum/Nodes: The imaged thyroid is unremarkable. The esophagus is grossly unremarkable. There is a moderate-to-large hiatal hernia containing much of the stomach. Is no mediastinal, hilar, or axillary lymphadenopathy. Lungs/Pleura: There are moderate-sized bilateral pleural effusions with adjacent atelectasis in the lower lobes, left worse than right. There is no other focal consolidation. There is no overt pulmonary edema. There is no pneumothorax. There are no suspicious nodules. Upper Abdomen: The liver is cirrhotic in morphology. There is small to moderate volume upper abdominal ascites. Musculoskeletal: There is no acute osseous abnormality or suspicious osseous lesion. Review of the MIP images confirms  the above findings. IMPRESSION: 1. Near occlusive pulmonary embolism in a lingular pulmonary artery without evidence of right heart strain. 2. Moderate-sized bilateral pleural effusions with adjacent atelectasis, left worse than right. 3. Cirrhotic morphology of the liver with small to moderate volume upper abdominal ascites. 4. Moderate-to-large hiatal hernia containing much of the stomach. These results will be called to the ordering clinician or representative by the Radiologist Assistant, and communication documented in the PACS or [employer] Electronically Signed   By: PValetta MoleM.D.   On: 01/11/2022 16:43   ECHOCARDIOGRAM COMPLETE  Result Date: 01/11/2022    ECHOCARDIOGRAM REPORT   Patient Name:   GJEANNINE PENNISIDate of Exam: 01/11/2022 Medical Rec #:  [id number]      Height:       60.0 in Accession #:    [id number]     Weight:       173.1 lb Date of Birth:  6January 11, 1942      BSA:          1.755 m Patient Age:    89years        BP:           124/51 mmHg Patient Gender: F               HR:           72 bpm. Exam Location:  [facility] Procedure: 2D Echo, Cardiac Doppler and Color Doppler Indications:     Pulmonary embolus  History:         Patient has prior history of Echocardiogram examinations, most                  recent 12/08/2021. CHF, CAD, Arrythmias:Atrial Fibrillation;                  Signs/Symptoms:Dyspnea.  Sonographer:     DWenda LowReferring Phys:  AShelly CossDiagnosing Phys: BKate SableMD IMPRESSIONS  1. Left ventricular ejection fraction, by estimation, is 55 to 60%. The left ventricle has normal function. The left ventricle has no regional wall motion abnormalities. Left ventricular diastolic parameters are indeterminate.  2. Right ventricular systolic function is normal. The right ventricular size  is normal. There is moderately elevated pulmonary artery systolic pressure.  3. Left atrial size was severely dilated.  4. Right atrial size was mildly dilated.  5. The  mitral valve is normal in structure. Mild to moderate mitral valve regurgitation.  6. Tricuspid valve regurgitation is moderate.  7. The aortic valve is tricuspid. Aortic valve regurgitation is not visualized.  8. The inferior vena cava is normal in size with <50% respiratory variability, suggesting right atrial pressure of 8 mmHg. FINDINGS  Left Ventricle: Left ventricular ejection fraction, by estimation, is 55 to 60%. The left ventricle has normal function. The left ventricle has no regional wall motion abnormalities. The left ventricular internal cavity size was normal in size. There is  no left ventricular hypertrophy. Left ventricular diastolic parameters are indeterminate. Right Ventricle: The right ventricular size is normal. No increase in right ventricular wall thickness. Right ventricular systolic function is normal. There is moderately elevated pulmonary artery systolic pressure. The tricuspid regurgitant velocity is 3.22 m/s, and with an assumed right atrial pressure of 10 mmHg, the estimated right ventricular systolic pressure is 78.2 mmHg. Left Atrium: Left atrial size was severely dilated. Right Atrium: Right atrial size was mildly dilated. Pericardium: There is no evidence of pericardial effusion. Mitral Valve: The mitral valve is normal in structure. Mild to moderate mitral valve regurgitation. MV peak gradient, 10.5 mmHg. The mean mitral valve gradient is 4.0 mmHg. Tricuspid Valve: The tricuspid valve is normal in structure. Tricuspid valve regurgitation is moderate. Aortic Valve: The aortic valve is tricuspid. Aortic valve regurgitation is not visualized. Aortic valve mean gradient measures 7.7 mmHg. Aortic valve peak gradient measures 13.9 mmHg. Aortic valve area, by VTI measures 2.81 cm. Pulmonic Valve: The pulmonic valve was normal in structure. Pulmonic valve regurgitation is not visualized. Aorta: The aortic root is normal in size and structure. Venous: The inferior vena cava is normal in  size with less than 50% respiratory variability, suggesting right atrial pressure of 8 mmHg. IAS/Shunts: No atrial level shunt detected by color flow Doppler.  LEFT VENTRICLE PLAX 2D LVIDd:         4.90 cm   Diastology LVIDs:         2.70 cm   LV e' lateral:   11.30 cm/s LV PW:         0.90 cm   LV E/e' lateral: 11.7 LV IVS:        0.80 cm LVOT diam:     2.00 cm LV SV:         118 LV SV Index:   67 LVOT Area:     3.14 cm  RIGHT VENTRICLE RV Basal diam:  4.00 cm RV Mid diam:    3.50 cm RV S prime:     13.40 cm/s TAPSE (M-mode): 2.8 cm LEFT ATRIUM             Index        RIGHT ATRIUM           Index LA diam:        3.90 cm 2.22 cm/m   RA Area:     18.40 cm LA Vol (A2C):   76.9 ml 43.81 ml/m  RA Volume:   53.00 ml  30.19 ml/m LA Vol (A4C):   98.6 ml 56.17 ml/m LA Biplane Vol: 90.6 ml 51.62 ml/m  AORTIC VALVE                     PULMONIC VALVE AV Area (  Vmax):    2.53 cm      PV Vmax:       1.25 m/s AV Area (Vmean):   2.64 cm      PV Peak grad:  6.2 mmHg AV Area (VTI):     2.81 cm AV Vmax:           186.33 cm/s AV Vmean:          130.667 cm/s AV VTI:            0.419 m AV Peak Grad:      13.9 mmHg AV Mean Grad:      7.7 mmHg LVOT Vmax:         150.00 cm/s LVOT Vmean:        109.667 cm/s LVOT VTI:          0.375 m LVOT/AV VTI ratio: 0.89  AORTA Ao Root diam: 2.60 cm MITRAL VALVE                TRICUSPID VALVE MV Area (PHT): 3.21 cm     TR Peak grad:   41.5 mmHg MV Area VTI:   3.24 cm     TR Vmax:        322.00 cm/s MV Peak grad:  10.5 mmHg MV Mean grad:  4.0 mmHg     SHUNTS MV Vmax:       1.62 m/s     Systemic VTI:  0.37 m MV Vmean:      91.6 cm/s    Systemic Diam: 2.00 cm MV Decel Time: 236 msec MV E velocity: 132.00 cm/s MV A velocity: 120.00 cm/s MV E/A ratio:  1.10 Kate Sable MD Electronically signed by Kate Sable MD Signature Date/Time: 01/11/2022/4:38:00 PM    Final    NM Pulmonary Perfusion  Addendum Date: 01/11/2022   ADDENDUM REPORT: 01/11/2022 14:54 ADDENDUM: Critical Value/emergent  results were called by telephone at the time of interpretation on 01/11/2022 at 2:54 pm to provider Eniya Cannady [facility] , who verbally acknowledged these results. Electronically Signed   By: Zetta Bills M.D.   On: 01/11/2022 14:54   Result Date: 01/11/2022 CLINICAL DATA:  Positive D-dimer, shortness of breath and atrial fibrillation. EXAM: NUCLEAR MEDICINE PERFUSION LUNG SCAN TECHNIQUE: Perfusion images were obtained in multiple projections after intravenous injection of radiopharmaceutical. Ventilation scans intentionally deferred if perfusion scan and chest x-ray adequate for interpretation during COVID 19 epidemic. RADIOPHARMACEUTICALS:  4.28 mCi Tc-62mMAA IV COMPARISON:  January 11, 2022 FINDINGS: On the comparison chest radiograph there is medial basilar airspace disease bilaterally. There is a wedge-shaped perfusion defect in the LEFT chest in the area of the lingula or anterior LEFT lower lobe not explain by the presence of airspace disease on the chest radiograph. There is attenuation of perfusion at the lung bases which is non wedge-shaped potentially related to airspace process. IMPRESSION: Positive for pulmonary embolism to lingula or anterior LEFT lower lobe. Mild attenuation of perfusion elsewhere of the lung bases related to underlying airspace process. A call is out to the referring provider to further discuss findings in the above case. Electronically Signed: By: GZetta BillsM.D. On: 01/11/2022 14:24   UKoreaVenous Img Lower Bilateral (DVT)  Result Date: 01/11/2022 CLINICAL DATA:  [id number]: BILATERAL LOWER EXTREMITY VENOUS DOPPLER ULTRASOUND TECHNIQUE: Gray-scale sonography with graded compression, as well as color Doppler and duplex ultrasound were performed to evaluate the lower extremity deep venous systems from the level of the common femoral vein and including the  common femoral, femoral, profunda femoral, popliteal and calf veins including the posterior tibial, peroneal and  gastrocnemius veins when visible. The superficial great saphenous vein was also interrogated. Spectral Doppler was utilized to evaluate flow at rest and with distal augmentation maneuvers in the common femoral, femoral and popliteal veins. COMPARISON:  None Available. FINDINGS: RIGHT LOWER EXTREMITY Common Femoral Vein: No evidence of thrombus. Normal compressibility, respiratory phasicity and response to augmentation. Saphenofemoral Junction: No evidence of thrombus. Normal compressibility and flow on color Doppler imaging. Profunda Femoral Vein: No evidence of thrombus. Normal compressibility and flow on color Doppler imaging. Femoral Vein: No evidence of thrombus. Normal compressibility, respiratory phasicity and response to augmentation. Popliteal Vein: No evidence of thrombus. Normal compressibility, respiratory phasicity and response to augmentation. Calf Veins: Limited visualization due to edema. No evidence of thrombus. Normal compressibility and flow on color Doppler imaging. LEFT LOWER EXTREMITY Common Femoral Vein: No evidence of thrombus. Normal compressibility, respiratory phasicity and response to augmentation. Saphenofemoral Junction: No evidence of thrombus. Normal compressibility and flow on color Doppler imaging. Profunda Femoral Vein: No evidence of thrombus. Normal compressibility and flow on color Doppler imaging. Femoral Vein: No evidence of thrombus. Normal compressibility, respiratory phasicity and response to augmentation. Popliteal Vein: No evidence of thrombus. Normal compressibility, respiratory phasicity and response to augmentation. Calf Veins: Limited visualization due to edema. No evidence of thrombus. Normal compressibility and flow on color Doppler imaging. IMPRESSION: No evidence of deep venous thrombosis in either lower extremity. Electronically Signed   By: Margaretha Sheffield M.D.   On: 01/11/2022 13:39   DG Chest Port 1 View  Result Date: 01/11/2022 CLINICAL DATA:  Dyspnea  EXAM: PORTABLE CHEST 1 VIEW COMPARISON:  Previous studies including the examination of 01/09/2022 FINDINGS: Transverse diameter of heart is within normal limits. Central pulmonary vessels are prominent. There are no signs of alveolar pulmonary edema. Soft tissue density seen in the medial right lower lung field corresponds to a fixed hiatal hernia seen in the previous CT done on 09/19/2021. Linear densities are seen in the medial left lower lung field suggesting subsegmental atelectasis. There is blunting of lateral CP angles. There is no pneumothorax. Tip of dialysis catheter is noted at the junction of superior vena cava and right atrium. IMPRESSION: Central pulmonary vessels are prominent without signs of alveolar pulmonary edema. Increased density in medial left lower lung fields suggest subsegmental atelectasis. Possible small bilateral pleural effusions. Electronically Signed   By: Elmer Picker M.D.   On: 01/11/2022 12:46    Scheduled Meds:  amiodarone  200 mg Oral Daily   Chlorhexidine Gluconate Cloth  6 each Topical Q0600   docusate sodium  100 mg Oral BID   doxycycline  100 mg Oral Q12H   feeding supplement (PRO-STAT SUGAR FREE 64)  30 mL Oral TID WC   hydrocortisone cream  1 Application Topical QID   lactulose  20 g Oral BID   lidocaine  2 patch Transdermal Q24H   multivitamin with minerals  1 tablet Oral Daily   pantoprazole  40 mg Oral BID   predniSONE  40 mg Oral Q breakfast   rifaximin  200 mg Oral BID   torsemide  40 mg Oral Once per day on Mon Wed Fri   Continuous Infusions:  anticoagulant sodium citrate       LOS: 2 days   Shelly Coss, MD [facility] P9/14/2023, 11:34 AM

## 2022-01-12 NOTE — Consult Note (Signed)
ANTICOAGULATION CONSULT NOTE - Initial Consult  Pharmacy Consult for heparin infusion Indication: pulmonary embolus  Allergies  Allergen Reactions   Penicillins Other (See Comments)    Okay to take amoxicillin/(pt does not recall what the reaction to penicillin was (38-83 years old)    Sulfa Antibiotics Rash    Patient Measurements: Height: 5' (152.4 cm) Weight: 78.4 kg (172 lb 13.5 oz) IBW/kg (Calculated) : 45.5 Heparin Dosing Weight: 65 kg   Vital Signs: Temp: 97.7 F (36.5 C) (09/14 0324) Temp Source: Oral (09/14 0324) BP: 108/42 (09/14 0324) Pulse Rate: 77 (09/14 0324)  Labs: Recent Labs    01/09/22 1516 01/09/22 1708 01/10/22 0451 01/11/22 2347 01/12/22 0420  HGB 7.2*  --  7.5*  --  6.8*  HCT 23.1*  --  22.5*  --  20.5*  PLT 162  --  124*  --  113*  APTT 38*  --   --   --   --   LABPROT 15.8*  --   --   --   --   INR 1.3*  --   --   --   --   HEPARINUNFRC  --   --   --  0.20* 0.44  CREATININE 4.93*  --  5.02*  --   --   TROPONINIHS 22* 25*  --   --   --      Estimated Creatinine Clearance: 7.9 mL/min (A) (by C-G formula based on SCr of 5.02 mg/dL (H)).   Medical History: Past Medical History:  Diagnosis Date   Anemia    CAD S/P PCI pRCA Promus DES 3.5 x 16 (4.1 mm), ostRPDA Promus DES 2.5 x 12 (2.7 mm) 03/25/2016   CAD S/P percutaneous coronary angioplasty    a. 07/2015 MV: No ischemia, EF 66%;  b. 03/2016 Inflat STEMI/PCI: LM nl, LAD 25d, RI nl, LCX nl, OM1/2 nl, RCA 80p (3.5x16 Promus Premier DES), 50p/m, 30d, RPDA 90 (2.5x12 Promu Premier DES); c. 01/2018 Cath ([place], [place]): Patent RCA stents->Med Rx.   CHF (congestive heart failure) ([place])    CKD (chronic kidney disease), stage III ([place])    Diabetes mellitus without complication ([place])    Diastolic dysfunction    a. 10/2013 Echo: EF 55-65%, Gr1 DD; b. 01/2018 Echo ([place], [place]): EF 55-60%, Gr2 DD, RVSP 29mHg.   Diverticulitis    Dyspnea    Endometriosis    Family  history of adverse reaction to anesthesia    Mother - had to stay in ICU because of breathing difficulties every time she had anesthesia   GERD (gastroesophageal reflux disease)    GI bleed    a. 06/2021 - melena-->2 u PRBCs.   Hiatal hernia    History of colon polyps 08/1994   History of Migraines    Hypercholesterolemia    Hypertension    LBBB (left bundle branch block)    Osteoporosis    PAF (paroxysmal atrial fibrillation) ([place]    a. 03/2016 Dx @ time of MI; b. CHA2DS2VASc = 5-->Eliquis.   Rheumatic fever    Sleep apnea    CPAP   Spasmodic dysphonia    ST elevation myocardial infarction (STEMI) of inferolateral wall, initial episode of care ([facility] 03/25/2016   ST elevation myocardial infarction (STEMI) of inferolateral wall, initial episode of care ([facility] 03/25/2016   Urine incontinence     Medications:  No prior anticoagulation noted   Assessment: 81y.o. female with medical history significant of ESRD-HD (TTS),  hypertension, hyperlipidemia, COPD, GERD, CAD, STEMI, DES stent placement, PAF not on anticoagulants due to GI bleeding, presented to ED 01/09/22 with shortness breath. Found to have elevated d-dimer PTA. NM pulmonary perfusion scan positive for PE.  Goal of Therapy:  Heparin level 0.3-0.7 units/ml Monitor platelets by anticoagulation protocol: Yes   Plan:  9/13:  HL @ 2761 = 0.20, SUBtherapeutic  - per RN, heparin gtt was off from 2121 to 2250 - will continue pt on current rate and recheck HL 8 hrs after restart on 9/14 @ 0700.   9/14:  HL @ 0420 = 0.44, therapeutic X 1 Will continue pt on current rate and draw confirmation level on 9/14 @ 1200.   Orene Desanctis, PharmD Clinical Pharmacist   01/12/2022,5:52 AM

## 2022-01-12 NOTE — Progress Notes (Signed)
Received patient in bed to unit.  Alert and oriented.  Informed consent signed and in chart.   Treatment initiated: [place] Treatment completed: 1244  Patient tolerated well. Patient report chest pain during treatment; required to decreased bfr. Toward the end of treatment patient hypotensive and out of UF. 1unit of PRBC given. NO s/s of reaction. Sodium Citrate locks catheter.  Transported back to the room  Alert, without acute distress.  Hand-off given to patient's nurse. Pauline Good RN  Access used: HD catheter  Access issues: no  Total UF removed: 2200 ml Medication(s) given: tylenol, Sodium citrate locks Post HD VS: 97.8, 73, 12, 106/49, 100% 3L Post HD weight: 78.3Kg    [facility]

## 2022-01-12 NOTE — TOC Progression Note (Signed)
Transition of Care [facility]) - Progression Note    Patient Details  Name: SHAQUISHA WYNN MRN: [id number] Date of Birth: [birth year]  [facility]) CM/SW Contact  Beverly Sessions, RN Phone Number: 01/12/2022, 10:31 AM  Clinical Narrative:    Per MD await palliative recommendation, and potential DC tomorrow to [facility]. Kim at [facility] updated.  Heron Nay is available to transport at discharge via [facility] with O2 if appropriate         Expected Discharge Plan and Services           Expected Discharge Date: 01/11/22                                     Social Determinants of Health (SDOH) Interventions    Readmission Risk Interventions    11/28/2021   11:17 AM 10/19/2021   12:27 PM  Readmission Risk Prevention Plan  Transportation Screening Complete Complete  PCP or Specialist Appt within 3-5 Days Complete Complete  HRI or [place] Complete Complete  Social Work Consult for [place] Planning/Counseling Complete Complete  Palliative Care Screening Not Applicable Complete  Medication Review Press photographer) Complete Complete

## 2022-01-12 NOTE — Consult Note (Signed)
ANTICOAGULATION CONSULT NOTE - Initial Consult  Pharmacy Consult for heparin infusion Indication: pulmonary embolus  Allergies  Allergen Reactions   Penicillins Other (See Comments)    Okay to take amoxicillin/(pt does not recall what the reaction to penicillin was (2-83 years old)    Sulfa Antibiotics Rash    Patient Measurements: Height: 5' (152.4 cm) Weight: 78.5 kg (173 lb 1 oz) IBW/kg (Calculated) : 45.5 Heparin Dosing Weight: 65 kg   Vital Signs: Temp: 98 F (36.7 C) (09/13 2005) Temp Source: Oral (09/13 1559) BP: 113/37 (09/13 2005) Pulse Rate: 71 (09/13 2005)  Labs: Recent Labs    01/09/22 1516 01/09/22 1708 01/10/22 0451 01/11/22 2347  HGB 7.2*  --  7.5*  --   HCT 23.1*  --  22.5*  --   PLT 162  --  124*  --   APTT 38*  --   --   --   LABPROT 15.8*  --   --   --   INR 1.3*  --   --   --   HEPARINUNFRC  --   --   --  0.20*  CREATININE 4.93*  --  5.02*  --   TROPONINIHS 22* 25*  --   --      Estimated Creatinine Clearance: 7.9 mL/min (A) (by C-G formula based on SCr of 5.02 mg/dL (H)).   Medical History: Past Medical History:  Diagnosis Date   Anemia    CAD S/P PCI pRCA Promus DES 3.5 x 16 (4.1 mm), ostRPDA Promus DES 2.5 x 12 (2.7 mm) 03/25/2016   CAD S/P percutaneous coronary angioplasty    a. 07/2015 MV: No ischemia, EF 66%;  b. 03/2016 Inflat STEMI/PCI: LM nl, LAD 25d, RI nl, LCX nl, OM1/2 nl, RCA 80p (3.5x16 Promus Premier DES), 50p/m, 30d, RPDA 90 (2.5x12 Promu Premier DES); c. 01/2018 Cath ([place], [place]): Patent RCA stents->Med Rx.   CHF (congestive heart failure) ([place])    CKD (chronic kidney disease), stage III ([place])    Diabetes mellitus without complication ([place])    Diastolic dysfunction    a. 10/2013 Echo: EF 55-65%, Gr1 DD; b. 01/2018 Echo ([place], [place]): EF 55-60%, Gr2 DD, RVSP 49mHg.   Diverticulitis    Dyspnea    Endometriosis    Family history of adverse reaction to anesthesia    Mother - had to stay  in ICU because of breathing difficulties every time she had anesthesia   GERD (gastroesophageal reflux disease)    GI bleed    a. 06/2021 - melena-->2 u PRBCs.   Hiatal hernia    History of colon polyps 08/1994   History of Migraines    Hypercholesterolemia    Hypertension    LBBB (left bundle branch block)    Osteoporosis    PAF (paroxysmal atrial fibrillation) ([place]    a. 03/2016 Dx @ time of MI; b. CHA2DS2VASc = 5-->Eliquis.   Rheumatic fever    Sleep apnea    CPAP   Spasmodic dysphonia    ST elevation myocardial infarction (STEMI) of inferolateral wall, initial episode of care ([facility] 03/25/2016   ST elevation myocardial infarction (STEMI) of inferolateral wall, initial episode of care ([facility] 03/25/2016   Urine incontinence     Medications:  No prior anticoagulation noted   Assessment: 81y.o. female with medical history significant of ESRD-HD (TTS), hypertension, hyperlipidemia, COPD, GERD, CAD, STEMI, DES stent placement, PAF not on anticoagulants due to GI bleeding, presented to ED 01/09/22  with shortness breath. Found to have elevated d-dimer PTA. NM pulmonary perfusion scan positive for PE.  Goal of Therapy:  Heparin level 0.3-0.7 units/ml Monitor platelets by anticoagulation protocol: Yes   Plan:  9/13:  HL @ 5320 = 0.20, SUBtherapeutic  - per RN, heparin gtt was off from 2121 to 2250 - will continue pt on current rate and recheck HL 8 hrs after restart on 9/14 @ 0700.   Orene Desanctis, PharmD Clinical Pharmacist   01/12/2022,12:59 AM

## 2022-01-12 NOTE — Consult Note (Signed)
Palliative Care Consult Note                                  Date: 01/12/2022   Patient Name: April [name]  DOB: May 12, 1940  MRN: [id number]  Age / Sex: 81 y.o., female  PCP: Einar Pheasant, MD Referring Physician: Shelly Coss, MD  Reason for Consultation: Establishing goals of care  HPI/Patient Profile: 81 y.o. female  with past medical history of ESRD-HD (TTS), hypertension, hyperlipidemia, COPD, GERD, CAD, STEMI, DES stent placement, PAF not on anticoagulants due to GI bleeding, liver cirrhosis, left bundle blockade, migraine, dCHF, OSA on CPAP, who presents with shortness breath for last several days, associated with cough with little mucus production. She was admitted on 01/09/2022 with COPD exacerbation and anemia.  Plan for discharge after improvement in the VQ scan was ordered due to elevated D-dimer which found a positive pulmonary embolism.  CT angiogram showed near occlusive pulmonary embolism in the lingular pulmonary artery without evidence of right heart strain.  She was started on heparin drip but this was stopped due to her anemia.  She is not a candidate for anticoagulation.  Because of high risk for recurrent clots including PE and CVA, PMT was consulted for goals of care discussion.  Past Medical History:  Diagnosis Date   Anemia    CAD S/P PCI pRCA Promus DES 3.5 x 16 (4.1 mm), ostRPDA Promus DES 2.5 x 12 (2.7 mm) 03/25/2016   CAD S/P percutaneous coronary angioplasty    a. 07/2015 MV: No ischemia, EF 66%;  b. 03/2016 Inflat STEMI/PCI: LM nl, LAD 25d, RI nl, LCX nl, OM1/2 nl, RCA 80p (3.5x16 Promus Premier DES), 50p/m, 30d, RPDA 90 (2.5x12 Promu Premier DES); c. 01/2018 Cath ([place], [place]): Patent RCA stents->Med Rx.   CHF (congestive heart failure) ([place])    CKD (chronic kidney disease), stage III ([place])    Diabetes mellitus without complication ([place])    Diastolic dysfunction    a. 10/2013 Echo: EF  55-65%, Gr1 DD; b. 01/2018 Echo ([place], [place]): EF 55-60%, Gr2 DD, RVSP 36mHg.   Diverticulitis    Dyspnea    Endometriosis    Family history of adverse reaction to anesthesia    Mother - had to stay in ICU because of breathing difficulties every time she had anesthesia   GERD (gastroesophageal reflux disease)    GI bleed    a. 06/2021 - melena-->2 u PRBCs.   Hiatal hernia    History of colon polyps 08/1994   History of Migraines    Hypercholesterolemia    Hypertension    LBBB (left bundle branch block)    Osteoporosis    PAF (paroxysmal atrial fibrillation) ([place]    a. 03/2016 Dx @ time of MI; b. CHA2DS2VASc = 5-->Eliquis.   Rheumatic fever    Sleep apnea    CPAP   Spasmodic dysphonia    ST elevation myocardial infarction (STEMI) of inferolateral wall, initial episode of care ([facility] 03/25/2016   ST elevation myocardial infarction (STEMI) of inferolateral wall, initial episode of care ([facility] 03/25/2016   Urine incontinence     Subjective:   This NP EWalden Fieldreviewed medical records, received report from team, assessed the patient and then meet at the patient's bedside to discuss diagnosis, prognosis, GOC, EOL wishes disposition and options.  I met with the patient at the bedside.  No family was present.  Concept of Palliative Care was introduced as specialized medical care for people and their families living with serious illness.  If focuses on providing relief from the symptoms and stress of a serious illness.  The goal is to improve quality of life for both the patient and the family. Values and goals of care important to patient and family were attempted to be elicited.  Created space and opportunity for patient  and family to explore thoughts and feelings regarding current medical situation   Natural trajectory and current clinical status were discussed. Questions and concerns addressed. Patient  encouraged to call with questions or concerns.     Patient/Family Understanding of Illness: She has a pretty good understanding of her chronic and acute comorbidities.  She notes she came in for COPD and shortness of breath that they found a blood clot in her lungs.  When she was talking about this PE she began crying saying his first time she is really broke down about that.  She also admits she has end-stage renal disease and is on hemodialysis.  She also notes that she has anemia.  We have further discussion about the specifics about her health issues due to multifactorial nature of anemia likely due to chronic disease, ESRD, mild bleeding with unidentified source thus far.  Goals: To be able to discharge from the hospital and get home.  Today's Discussion: In addition to discussions described above we had substantial discussion on various topics.  She clarified that if she was unable to make her decision she would want her son [name] to be her Marine scientist.  She did have a daughter who passed away and so [name] is her only son.  She is not currently married.  We discussed that given the risk for recurrent clots including PEs and CVAs with possible death or profound alterations to her life that it is important to make decisions now while she is able to express her wishes.  She verbalized understanding.  She is tolerating hemodialysis well and she would like to continue to do this.  She is currently on TTS schedule and is seen by nephrology chronically.  She did confirm desire for DNR status.  I ensured that she understand the implications of this.  Finally, we discussed other wishes and completed a MOST form.  Recommendations for MOST form are below but in summary requests DNR, limited scope, feeding tube for defined trial and only if it would likely result in long-term benefit.  She would not want a permanent or long-term feeding tube.  Otherwise she is okay with antibiotics and IV fluids.  She confirms that [facility] outpatient  palliative care has seen her and followed.  I recommended that they continue to see her given her complex illnesses and she agreed.  I provided a contact card for our service for while she is admitted to the inpatient setting.  I offered a copy of "difficult choices for loving people" but she states that she already has a copy of this from [employer].  I provided emotional and general support through therapeutic listening, empathy, sharing of stories, and other techniques. I answered all questions and addressed all concerns to the best of my ability.  Review of Systems  Constitutional:  Positive for fatigue.  Respiratory:  Positive for shortness of breath (improved). Negative for cough and chest tightness.   Cardiovascular:  Negative for chest pain.  Gastrointestinal:  Negative for abdominal pain, nausea and vomiting.    Objective:   Primary Diagnoses: Present  on Admission:  Symptomatic anemia  Chronic diastolic CHF (congestive heart failure) (HCC)  Hyperlipidemia  Paroxysmal atrial fibrillation (HCC)  Cirrhosis (HCC)  HTN (hypertension)  COPD exacerbation (HCC)  Sepsis (HCC)  Anemia in ESRD (end-stage renal disease) ([place])   Physical Exam Vitals and nursing note reviewed.  Constitutional:      General: She is not in acute distress.    Appearance: She is ill-appearing. She is not toxic-appearing.  HENT:     Head: Normocephalic and atraumatic.  Cardiovascular:     Rate and Rhythm: Rhythm irregular.  Pulmonary:     Effort: Pulmonary effort is normal. No respiratory distress.  Abdominal:     General: Abdomen is flat.     Palpations: Abdomen is soft.  Skin:    General: Skin is warm and dry.  Neurological:     General: No focal deficit present.     Mental Status: She is alert.  Psychiatric:        Mood and Affect: Mood normal.        Behavior: Behavior normal.     Vital Signs:  BP (!) 106/49   Pulse 73   Temp 97.8 F (36.6 C)   Resp 12   Ht 5' (1.524 m)   Wt 78.3  kg   LMP 05/01/1972   SpO2 100%   BMI 33.71 kg/m   Palliative Assessment/Data: 40-50%    Advanced Care Planning:   Primary Decision Maker: PATIENT  Code Status/Advance Care Planning: DNR  A discussion was had today regarding advanced directives. Concepts specific to code status, artifical feeding and hydration, continued IV antibiotics and rehospitalization was had.  The difference between a aggressive medical intervention path and a palliative comfort care path for this patient at this time was had. The MOST form was introduced and discussed. The mMOST form was completed, scanned into ACP/Vynca tab and a copy placed on the chart.  Decisions/Changes to ACP: Remain DNR Further decisions as per below   I completed a MOST form today. The patient outlined her wishes for the following treatment decisions:  Cardiopulmonary Resuscitation: Do Not Attempt Resuscitation (DNR/No CPR)  Medical Interventions: Limited Additional Interventions: Use medical treatment, IV fluids and cardiac monitoring as indicated, DO NOT USE intubation or mechanical ventilation. May consider use of less invasive airway support such as BiPAP or CPAP. Also provide comfort measures. Transfer to the hospital if indicated. Avoid intensive care.   Antibiotics: Antibiotics if indicated  IV Fluids: IV fluids if indicated  Feeding Tube: Feeding tube for a defined trial period     Assessment & Plan:   Impression: 81 year old female with multiple acute and chronic conditions as outlined above.  Most pressing at this time is A-fib with subsequent PE likely related to clots from her arrhythmia.  She is not a candidate for anticoagulation given her significant anemia, likely multifactorial in nature.  She will remain at risk for further medical complications, rehospitalization's, alterations in her physical functioning, death.  She understands her risks.  Given this we have taken the time to clarify her wishes now to help  her son and decision making should she encounter severe illness in the future.  Anticipate discharge in next 24 to 48 hours.  Overall prognosis is guarded to poor.  SUMMARY OF RECOMMENDATIONS   Remain DNR MOST form elections as per above Continue current scope of treatment PMT will continue to follow peripherally Please notify us of any significant clinical changes or new palliative needs  Symptom Management:  Per primary team PMT is available to assist as needed  Prognosis:  Unable to determine  Discharge Planning:  To Be Determined   Discussed with: Patient, medical team, nursing team    Thank you for allowing [place] to participate in the care of [name] PMT will continue to support holistically.  Time Total: 120 min  Greater than 50%  of this time was spent counseling and coordinating care related to the above assessment and plan.  Signed by: Walden Field, NP Palliative Medicine Team  Team Phone # [phone] (Nights/Weekends)  01/12/2022, 3:07 PM

## 2022-01-13 DIAGNOSIS — R531 Weakness: Secondary | ICD-10-CM | POA: Diagnosis not present

## 2022-01-13 DIAGNOSIS — K7682 Hepatic encephalopathy: Secondary | ICD-10-CM | POA: Diagnosis not present

## 2022-01-13 DIAGNOSIS — E871 Hypo-osmolality and hyponatremia: Secondary | ICD-10-CM | POA: Diagnosis present

## 2022-01-13 DIAGNOSIS — I2782 Chronic pulmonary embolism: Secondary | ICD-10-CM | POA: Diagnosis not present

## 2022-01-13 DIAGNOSIS — K922 Gastrointestinal hemorrhage, unspecified: Secondary | ICD-10-CM | POA: Diagnosis not present

## 2022-01-13 DIAGNOSIS — I10 Essential (primary) hypertension: Secondary | ICD-10-CM | POA: Diagnosis not present

## 2022-01-13 DIAGNOSIS — J9811 Atelectasis: Secondary | ICD-10-CM | POA: Diagnosis present

## 2022-01-13 DIAGNOSIS — R579 Shock, unspecified: Secondary | ICD-10-CM | POA: Diagnosis present

## 2022-01-13 DIAGNOSIS — D649 Anemia, unspecified: Secondary | ICD-10-CM | POA: Diagnosis not present

## 2022-01-13 DIAGNOSIS — K5521 Angiodysplasia of colon with hemorrhage: Secondary | ICD-10-CM | POA: Diagnosis present

## 2022-01-13 DIAGNOSIS — R601 Generalized edema: Secondary | ICD-10-CM | POA: Diagnosis not present

## 2022-01-13 DIAGNOSIS — N186 End stage renal disease: Secondary | ICD-10-CM | POA: Diagnosis present

## 2022-01-13 DIAGNOSIS — K921 Melena: Secondary | ICD-10-CM | POA: Diagnosis not present

## 2022-01-13 DIAGNOSIS — R1110 Vomiting, unspecified: Secondary | ICD-10-CM | POA: Diagnosis not present

## 2022-01-13 DIAGNOSIS — K449 Diaphragmatic hernia without obstruction or gangrene: Secondary | ICD-10-CM | POA: Diagnosis not present

## 2022-01-13 DIAGNOSIS — R600 Localized edema: Secondary | ICD-10-CM | POA: Diagnosis not present

## 2022-01-13 DIAGNOSIS — I2510 Atherosclerotic heart disease of native coronary artery without angina pectoris: Secondary | ICD-10-CM | POA: Diagnosis not present

## 2022-01-13 DIAGNOSIS — R2681 Unsteadiness on feet: Secondary | ICD-10-CM | POA: Diagnosis not present

## 2022-01-13 DIAGNOSIS — Z992 Dependence on renal dialysis: Secondary | ICD-10-CM | POA: Diagnosis not present

## 2022-01-13 DIAGNOSIS — K219 Gastro-esophageal reflux disease without esophagitis: Secondary | ICD-10-CM | POA: Diagnosis not present

## 2022-01-13 DIAGNOSIS — I2693 Single subsegmental pulmonary embolism without acute cor pulmonale: Secondary | ICD-10-CM | POA: Diagnosis not present

## 2022-01-13 DIAGNOSIS — I2699 Other pulmonary embolism without acute cor pulmonale: Secondary | ICD-10-CM | POA: Diagnosis not present

## 2022-01-13 DIAGNOSIS — I5033 Acute on chronic diastolic (congestive) heart failure: Secondary | ICD-10-CM | POA: Diagnosis present

## 2022-01-13 DIAGNOSIS — E1122 Type 2 diabetes mellitus with diabetic chronic kidney disease: Secondary | ICD-10-CM | POA: Diagnosis present

## 2022-01-13 DIAGNOSIS — I081 Rheumatic disorders of both mitral and tricuspid valves: Secondary | ICD-10-CM | POA: Diagnosis present

## 2022-01-13 DIAGNOSIS — J449 Chronic obstructive pulmonary disease, unspecified: Secondary | ICD-10-CM | POA: Diagnosis not present

## 2022-01-13 DIAGNOSIS — Z4901 Encounter for fitting and adjustment of extracorporeal dialysis catheter: Secondary | ICD-10-CM | POA: Diagnosis not present

## 2022-01-13 DIAGNOSIS — Z515 Encounter for palliative care: Secondary | ICD-10-CM | POA: Diagnosis not present

## 2022-01-13 DIAGNOSIS — Z1152 Encounter for screening for COVID-19: Secondary | ICD-10-CM | POA: Diagnosis not present

## 2022-01-13 DIAGNOSIS — D696 Thrombocytopenia, unspecified: Secondary | ICD-10-CM | POA: Diagnosis not present

## 2022-01-13 DIAGNOSIS — I959 Hypotension, unspecified: Secondary | ICD-10-CM | POA: Diagnosis not present

## 2022-01-13 DIAGNOSIS — Z9861 Coronary angioplasty status: Secondary | ICD-10-CM | POA: Diagnosis not present

## 2022-01-13 DIAGNOSIS — A419 Sepsis, unspecified organism: Secondary | ICD-10-CM | POA: Diagnosis not present

## 2022-01-13 DIAGNOSIS — E872 Acidosis, unspecified: Secondary | ICD-10-CM | POA: Diagnosis present

## 2022-01-13 DIAGNOSIS — G4733 Obstructive sleep apnea (adult) (pediatric): Secondary | ICD-10-CM | POA: Diagnosis not present

## 2022-01-13 DIAGNOSIS — R188 Other ascites: Secondary | ICD-10-CM | POA: Diagnosis present

## 2022-01-13 DIAGNOSIS — Z9989 Dependence on other enabling machines and devices: Secondary | ICD-10-CM | POA: Diagnosis not present

## 2022-01-13 DIAGNOSIS — I480 Paroxysmal atrial fibrillation: Secondary | ICD-10-CM | POA: Diagnosis not present

## 2022-01-13 DIAGNOSIS — I700 Atherosclerosis of aorta: Secondary | ICD-10-CM | POA: Diagnosis present

## 2022-01-13 DIAGNOSIS — K7460 Unspecified cirrhosis of liver: Secondary | ICD-10-CM | POA: Diagnosis present

## 2022-01-13 DIAGNOSIS — I272 Pulmonary hypertension, unspecified: Secondary | ICD-10-CM | POA: Diagnosis present

## 2022-01-13 DIAGNOSIS — I5032 Chronic diastolic (congestive) heart failure: Secondary | ICD-10-CM | POA: Diagnosis not present

## 2022-01-13 DIAGNOSIS — D500 Iron deficiency anemia secondary to blood loss (chronic): Secondary | ICD-10-CM | POA: Diagnosis not present

## 2022-01-13 DIAGNOSIS — N250 Renal osteodystrophy: Secondary | ICD-10-CM | POA: Diagnosis not present

## 2022-01-13 DIAGNOSIS — E8770 Fluid overload, unspecified: Secondary | ICD-10-CM | POA: Diagnosis not present

## 2022-01-13 DIAGNOSIS — D509 Iron deficiency anemia, unspecified: Secondary | ICD-10-CM | POA: Diagnosis not present

## 2022-01-13 DIAGNOSIS — D631 Anemia in chronic kidney disease: Secondary | ICD-10-CM | POA: Diagnosis present

## 2022-01-13 DIAGNOSIS — K31811 Angiodysplasia of stomach and duodenum with bleeding: Secondary | ICD-10-CM | POA: Diagnosis present

## 2022-01-13 DIAGNOSIS — R6520 Severe sepsis without septic shock: Secondary | ICD-10-CM | POA: Diagnosis not present

## 2022-01-13 DIAGNOSIS — K5730 Diverticulosis of large intestine without perforation or abscess without bleeding: Secondary | ICD-10-CM | POA: Diagnosis not present

## 2022-01-13 DIAGNOSIS — K766 Portal hypertension: Secondary | ICD-10-CM | POA: Diagnosis present

## 2022-01-13 DIAGNOSIS — Z66 Do not resuscitate: Secondary | ICD-10-CM | POA: Diagnosis present

## 2022-01-13 DIAGNOSIS — I25110 Atherosclerotic heart disease of native coronary artery with unstable angina pectoris: Secondary | ICD-10-CM | POA: Diagnosis not present

## 2022-01-13 DIAGNOSIS — M6281 Muscle weakness (generalized): Secondary | ICD-10-CM | POA: Diagnosis not present

## 2022-01-13 DIAGNOSIS — N179 Acute kidney failure, unspecified: Secondary | ICD-10-CM | POA: Diagnosis not present

## 2022-01-13 DIAGNOSIS — N189 Chronic kidney disease, unspecified: Secondary | ICD-10-CM | POA: Diagnosis not present

## 2022-01-13 DIAGNOSIS — I132 Hypertensive heart and chronic kidney disease with heart failure and with stage 5 chronic kidney disease, or end stage renal disease: Secondary | ICD-10-CM | POA: Diagnosis present

## 2022-01-13 DIAGNOSIS — D62 Acute posthemorrhagic anemia: Secondary | ICD-10-CM | POA: Diagnosis present

## 2022-01-13 DIAGNOSIS — J3800 Paralysis of vocal cords and larynx, unspecified: Secondary | ICD-10-CM | POA: Diagnosis not present

## 2022-01-13 DIAGNOSIS — E785 Hyperlipidemia, unspecified: Secondary | ICD-10-CM | POA: Diagnosis not present

## 2022-01-13 DIAGNOSIS — N2581 Secondary hyperparathyroidism of renal origin: Secondary | ICD-10-CM | POA: Diagnosis present

## 2022-01-13 DIAGNOSIS — I5022 Chronic systolic (congestive) heart failure: Secondary | ICD-10-CM | POA: Diagnosis not present

## 2022-01-13 DIAGNOSIS — I482 Chronic atrial fibrillation, unspecified: Secondary | ICD-10-CM | POA: Diagnosis not present

## 2022-01-13 DIAGNOSIS — R2689 Other abnormalities of gait and mobility: Secondary | ICD-10-CM | POA: Diagnosis not present

## 2022-01-13 LAB — CBC
HCT: 21.9 % — ABNORMAL LOW (ref 36.0–46.0)
Hemoglobin: 7.3 g/dL — ABNORMAL LOW (ref 12.0–15.0)
MCH: 31.3 pg (ref 26.0–34.0)
MCHC: 33.3 g/dL (ref 30.0–36.0)
MCV: 94 fL (ref 80.0–100.0)
Platelets: 94 10*3/uL — ABNORMAL LOW (ref 150–400)
RBC: 2.33 MIL/uL — ABNORMAL LOW (ref 3.87–5.11)
RDW: 20.9 % — ABNORMAL HIGH (ref 11.5–15.5)
WBC: 10.7 10*3/uL — ABNORMAL HIGH (ref 4.0–10.5)
nRBC: 0 % (ref 0.0–0.2)

## 2022-01-13 LAB — TYPE AND SCREEN
ABO/RH(D): A POS
Antibody Screen: NEGATIVE
Unit division: 0
Unit division: 0

## 2022-01-13 LAB — BPAM RBC
Blood Product Expiration Date: 202309132359
Blood Product Expiration Date: 202310112359
ISSUE DATE / TIME: 202309111725
ISSUE DATE / TIME: 202309140855
Unit Type and Rh: 600
Unit Type and Rh: 6200

## 2022-01-13 LAB — BASIC METABOLIC PANEL
Anion gap: 12 (ref 5–15)
BUN: 52 mg/dL — ABNORMAL HIGH (ref 8–23)
CO2: 24 mmol/L (ref 22–32)
Calcium: 8.4 mg/dL — ABNORMAL LOW (ref 8.9–10.3)
Chloride: 98 mmol/L (ref 98–111)
Creatinine, Ser: 3.23 mg/dL — ABNORMAL HIGH (ref 0.44–1.00)
GFR, Estimated: 14 mL/min — ABNORMAL LOW (ref >60)
Glucose, Bld: 144 mg/dL — ABNORMAL HIGH (ref 70–99)
Potassium: 3.6 mmol/L (ref 3.5–5.1)
Sodium: 134 mmol/L — ABNORMAL LOW (ref 135–145)

## 2022-01-13 MED ORDER — PREDNISONE 20 MG PO TABS: 40.0000 mg | ORAL_TABLET | Freq: Every day | ORAL | 0 refills | Status: DC

## 2022-01-13 MED ORDER — DOXYCYCLINE HYCLATE 100 MG PO TABS: 100.0000 mg | ORAL_TABLET | Freq: Two times a day (BID) | ORAL | 0 refills | Status: AC

## 2022-01-13 NOTE — TOC Progression Note (Signed)
Transition of Care [facility]) - Progression Note    Patient Details  Name: April [name] MRN: [id number] Date of Birth: 1940/05/21  Transition of Care [facility]) CM/SW [place], LCSW Phone Number: 01/13/2022, 10:08 AM  Clinical Narrative:    Patient medically ready to DC to [facility] today. Left VM for Joelene Millin in Admissions requesting return call.        Expected Discharge Plan and Services           Expected Discharge Date: 01/11/22                                     Social Determinants of Health (SDOH) Interventions    Readmission Risk Interventions    11/28/2021   11:17 AM 10/19/2021   12:27 PM  Readmission Risk Prevention Plan  Transportation Screening Complete Complete  PCP or Specialist Appt within 3-5 Days Complete Complete  HRI or Home Care Consult Complete Complete  Social Work Consult for [place] Planning/Counseling Complete Complete  Palliative Care Screening Not Applicable Complete  Medication Review Press photographer) Complete Complete

## 2022-01-13 NOTE — Progress Notes (Signed)
Mobility Specialist - Progress Note   01/13/22 1054  Mobility  Activity Ambulated with assistance in hallway  Level of Assistance Standby assist, set-up cues, supervision of patient - no hands on  Assistive Device Front wheel walker  Distance Ambulated (ft) 180 ft  Activity Response Tolerated well  $Mobility charge 1 Mobility   Pre-mobility: SpO2 = 98 During mobility: SpO2 = 95  Post-mobility: SPO2 = 95   Pt sitting in recliner on RA upon arrival. Pt STS indep and ambulates in hallway SBA +2 for chair follow. PT has one LOB corrected by CGA. Pt returns to recliner with needs in reach.   April [name]  Mobility Specialist  01/13/22 10:57 AM

## 2022-01-13 NOTE — Progress Notes (Signed)
[facility]  ROUNDING NOTE   Subjective:   April [name] is a 81 year old female with past medical history including diastolic CHF, anemia, CAD, paroxysmal atrial fibrillation without anticoagulation.  GI bleed.  Chronic kidney disease stage IV.  Patient reports to ED with abnormal labs.  Patient has been admitted for COPD exacerbation ([place]) [J44.1] Symptomatic anemia [D64.9]  Patient is known to our practice and currently receives outpatient dialysis treatments at [facility] on a TTS schedule,    Sitting up in the chair today.  Able to eat some. Continues to have lower extremity edema but now below the knees. Now on room air.  Denies any shortness of breath.    Objective:  Vital signs in last 24 hours:  Temp:  [97.8 F (36.6 C)-98.3 F (36.8 C)] 98.2 F (36.8 C) (09/15 0813) Pulse Rate:  [62-71] 66 (09/15 0813) Resp:  [12-19] 18 (09/15 0813) BP: (97-112)/(34-56) 112/56 (09/15 0813) SpO2:  [95 %-99 %] 95 % (09/15 0813) Weight:  [77.8 kg] 77.8 kg (09/15 0700)  Weight change: -0.8 kg Filed Weights   01/12/22 0836 01/12/22 1244 01/13/22 0700  Weight: 80.4 kg 78.3 kg 77.8 kg    Intake/Output: I/O last 3 completed shifts: In: 629.7 [P.O.:240; I.V.:84.7; Blood:300; IV Piggyback:5] Out: 2202 [Other:2200; Stool:2]   Intake/Output this shift:  No intake/output data recorded.  Physical Exam: General: NAD  Head: Normocephalic, atraumatic. Moist oral mucosal membranes  Eyes: Anicteric  Lungs:  Clear to auscultation, normal effort, room air  Heart: Irregular rate and rhythm  Abdomen:  Soft, nontender, nondistended  Extremities: 2-3+ edema below the knees.  Neurologic: Alert, oriented  Skin: No lesions, dry  Access: Rt chest permcath    Basic Metabolic Panel: Recent Labs  Lab 01/09/22 1516 01/10/22 0451 01/12/22 0420 01/13/22 0430  NA 136 133* 133* 134*  K 3.7 3.5 3.8 3.6  CL 103 101 99 98  CO2 '22 22 22 24  '$ GLUCOSE 132* 141* 161* 144*  BUN 72*  78* 65* 52*  CREATININE 4.93* 5.02* 4.36* 3.23*  CALCIUM 9.2 8.6* 8.7* 8.4*  PHOS  --   --  5.5*  --      Liver Function Tests: Recent Labs  Lab 01/12/22 0420  ALBUMIN 2.6*    No results for input(s): "LIPASE", "AMYLASE" in the last 168 hours. Recent Labs  Lab 01/09/22 2130  AMMONIA 38*     CBC: Recent Labs  Lab 01/09/22 1516 01/10/22 0451 01/12/22 0420 01/12/22 1034 01/13/22 0430  WBC 13.5* 8.7 11.5*  --  10.7*  HGB 7.2* 7.5* 6.8* 8.1* 7.3*  HCT 23.1* 22.5* 20.5* 24.1* 21.9*  MCV 101.8* 94.1 96.7  --  94.0  PLT 162 124* 113*  --  94*     Cardiac Enzymes: No results for input(s): "CKTOTAL", "CKMB", "CKMBINDEX", "TROPONINI" in the last 168 hours.  BNP: Invalid input(s): "POCBNP"  CBG: No results for input(s): "GLUCAP" in the last 168 hours.   Microbiology: Results for orders placed or performed during the hospital encounter of 01/09/22  SARS Coronavirus 2 by RT PCR (hospital order, performed in [facility] hospital lab) *cepheid single result test* Anterior Nasal Swab     Status: None   Collection Time: 01/09/22  8:55 PM   Specimen: Anterior Nasal Swab  Result Value Ref Range Status   SARS Coronavirus 2 by RT PCR NEGATIVE NEGATIVE Final    Comment: (NOTE) SARS-CoV-2 target nucleic acids are NOT DETECTED.  The SARS-CoV-2 RNA is generally detectable in upper  and lower respiratory specimens during the acute phase of infection. The lowest concentration of SARS-CoV-2 viral copies this assay can detect is 250 copies / mL. A negative result does not preclude SARS-CoV-2 infection and should not be used as the sole basis for treatment or other patient management decisions.  A negative result may occur with improper specimen collection / handling, submission of specimen other than nasopharyngeal swab, presence of viral mutation(s) within the areas targeted by this assay, and inadequate number of viral copies (<250 copies / mL). A negative result must be  combined with clinical observations, patient history, and epidemiological information.  Fact Sheet for Patients:   https://www.patel.info/  Fact Sheet for Healthcare Providers: https://hall.com/  This test is not yet approved or  cleared by the [place] FDA and has been authorized for detection and/or diagnosis of SARS-CoV-2 by FDA under an Emergency Use Authorization (EUA).  This EUA will remain in effect (meaning this test can be used) for the duration of the COVID-19 declaration under Section 564(b)(1) of the Act, 21 U.S.C. section 360bbb-3(b)(1), unless the authorization is terminated or revoked sooner.  Performed at [facility], [address] [place], [place] [address]     Coagulation Studies: No results for input(s): "LABPROT", "INR" in the last 72 hours.   Urinalysis: No results for input(s): "COLORURINE", "LABSPEC", "PHURINE", "GLUCOSEU", "HGBUR", "BILIRUBINUR", "KETONESUR", "PROTEINUR", "UROBILINOGEN", "NITRITE", "LEUKOCYTESUR" in the last 72 hours.  Invalid input(s): "APPERANCEUR"    Imaging: CT Angio Chest Pulmonary Embolism (PE) W or WO Contrast  Result Date: 01/11/2022 CLINICAL DATA:  Shortness of breath for several days. EXAM: CT ANGIOGRAPHY CHEST WITH CONTRAST TECHNIQUE: Multidetector CT imaging of the chest was performed using the standard protocol during bolus administration of intravenous contrast. Multiplanar CT image reconstructions and MIPs were obtained to evaluate the vascular anatomy. RADIATION DOSE REDUCTION: This exam was performed according to the departmental dose-optimization program which includes automated exposure control, adjustment of the mA and/or kV according to patient size and/or use of iterative reconstruction technique. CONTRAST:  8m OMNIPAQUE IOHEXOL 350 MG/ML SOLN COMPARISON:  Same day nuclear perfusion scan, CT chest 09/19/2021 FINDINGS: Cardiovascular: There is adequate  opacification of the pulmonary arteries to the segmental level. There is near occlusive pulmonary embolism in a segmental artery in the lingula (5-154, 7-60). No other pulmonary embolism is seen. There is no evidence of right heart strain. There is calcified atherosclerotic plaque throughout the thoracic aorta. The heart size is normal. There is no pericardial effusion. Coronary artery calcifications are noted. A right-sided dialysis catheter is in place terminating in the right atrium. Mediastinum/Nodes: The imaged thyroid is unremarkable. The esophagus is grossly unremarkable. There is a moderate-to-large hiatal hernia containing much of the stomach. Is no mediastinal, hilar, or axillary lymphadenopathy. Lungs/Pleura: There are moderate-sized bilateral pleural effusions with adjacent atelectasis in the lower lobes, left worse than right. There is no other focal consolidation. There is no overt pulmonary edema. There is no pneumothorax. There are no suspicious nodules. Upper Abdomen: The liver is cirrhotic in morphology. There is small to moderate volume upper abdominal ascites. Musculoskeletal: There is no acute osseous abnormality or suspicious osseous lesion. Review of the MIP images confirms the above findings. IMPRESSION: 1. Near occlusive pulmonary embolism in a lingular pulmonary artery without evidence of right heart strain. 2. Moderate-sized bilateral pleural effusions with adjacent atelectasis, left worse than right. 3. Cirrhotic morphology of the liver with small to moderate volume upper abdominal ascites. 4. Moderate-to-large hiatal hernia containing much of  the stomach. These results will be called to the ordering clinician or representative by the Radiologist Assistant, and communication documented in the PACS or [employer]. Electronically Signed   By: Valetta Mole M.D.   On: 01/11/2022 16:43   ECHOCARDIOGRAM COMPLETE  Result Date: 01/11/2022    ECHOCARDIOGRAM REPORT   Patient Name:    April [name] Date of Exam: 01/11/2022 Medical Rec #:  [id number]       Height:       60.0 in Accession #:    [id number]      Weight:       173.1 lb Date of Birth:  1941-03-13       BSA:          1.755 m Patient Age:    71 years        BP:           124/51 mmHg Patient Gender: F               HR:           72 bpm. Exam Location:  [facility] Procedure: 2D Echo, Cardiac Doppler and Color Doppler Indications:     Pulmonary embolus  History:         Patient has prior history of Echocardiogram examinations, most                  recent 12/08/2021. CHF, CAD, Arrythmias:Atrial Fibrillation;                  Signs/Symptoms:Dyspnea.  Sonographer:     Wenda Low Referring Phys:  Shelly Coss Diagnosing Phys: Kate Sable MD IMPRESSIONS  1. Left ventricular ejection fraction, by estimation, is 55 to 60%. The left ventricle has normal function. The left ventricle has no regional wall motion abnormalities. Left ventricular diastolic parameters are indeterminate.  2. Right ventricular systolic function is normal. The right ventricular size is normal. There is moderately elevated pulmonary artery systolic pressure.  3. Left atrial size was severely dilated.  4. Right atrial size was mildly dilated.  5. The mitral valve is normal in structure. Mild to moderate mitral valve regurgitation.  6. Tricuspid valve regurgitation is moderate.  7. The aortic valve is tricuspid. Aortic valve regurgitation is not visualized.  8. The inferior vena cava is normal in size with <50% respiratory variability, suggesting right atrial pressure of 8 mmHg. FINDINGS  Left Ventricle: Left ventricular ejection fraction, by estimation, is 55 to 60%. The left ventricle has normal function. The left ventricle has no regional wall motion abnormalities. The left ventricular internal cavity size was normal in size. There is  no left ventricular hypertrophy. Left ventricular diastolic parameters are indeterminate. Right Ventricle: The right ventricular size  is normal. No increase in right ventricular wall thickness. Right ventricular systolic function is normal. There is moderately elevated pulmonary artery systolic pressure. The tricuspid regurgitant velocity is 3.22 m/s, and with an assumed right atrial pressure of 10 mmHg, the estimated right ventricular systolic pressure is 84.1 mmHg. Left Atrium: Left atrial size was severely dilated. Right Atrium: Right atrial size was mildly dilated. Pericardium: There is no evidence of pericardial effusion. Mitral Valve: The mitral valve is normal in structure. Mild to moderate mitral valve regurgitation. MV peak gradient, 10.5 mmHg. The mean mitral valve gradient is 4.0 mmHg. Tricuspid Valve: The tricuspid valve is normal in structure. Tricuspid valve regurgitation is moderate. Aortic Valve: The aortic valve is tricuspid. Aortic valve regurgitation is not visualized. Aortic valve  mean gradient measures 7.7 mmHg. Aortic valve peak gradient measures 13.9 mmHg. Aortic valve area, by VTI measures 2.81 cm. Pulmonic Valve: The pulmonic valve was normal in structure. Pulmonic valve regurgitation is not visualized. Aorta: The aortic root is normal in size and structure. Venous: The inferior vena cava is normal in size with less than 50% respiratory variability, suggesting right atrial pressure of 8 mmHg. IAS/Shunts: No atrial level shunt detected by color flow Doppler.  LEFT VENTRICLE PLAX 2D LVIDd:         4.90 cm   Diastology LVIDs:         2.70 cm   LV e' lateral:   11.30 cm/s LV PW:         0.90 cm   LV E/e' lateral: 11.7 LV IVS:        0.80 cm LVOT diam:     2.00 cm LV SV:         118 LV SV Index:   67 LVOT Area:     3.14 cm  RIGHT VENTRICLE RV Basal diam:  4.00 cm RV Mid diam:    3.50 cm RV S prime:     13.40 cm/s TAPSE (M-mode): 2.8 cm LEFT ATRIUM             Index        RIGHT ATRIUM           Index LA diam:        3.90 cm 2.22 cm/m   RA Area:     18.40 cm LA Vol (A2C):   76.9 ml 43.81 ml/m  RA Volume:   53.00 ml  30.19  ml/m LA Vol (A4C):   98.6 ml 56.17 ml/m LA Biplane Vol: 90.6 ml 51.62 ml/m  AORTIC VALVE                     PULMONIC VALVE AV Area (Vmax):    2.53 cm      PV Vmax:       1.25 m/s AV Area (Vmean):   2.64 cm      PV Peak grad:  6.2 mmHg AV Area (VTI):     2.81 cm AV Vmax:           186.33 cm/s AV Vmean:          130.667 cm/s AV VTI:            0.419 m AV Peak Grad:      13.9 mmHg AV Mean Grad:      7.7 mmHg LVOT Vmax:         150.00 cm/s LVOT Vmean:        109.667 cm/s LVOT VTI:          0.375 m LVOT/AV VTI ratio: 0.89  AORTA Ao Root diam: 2.60 cm MITRAL VALVE                TRICUSPID VALVE MV Area (PHT): 3.21 cm     TR Peak grad:   41.5 mmHg MV Area VTI:   3.24 cm     TR Vmax:        322.00 cm/s MV Peak grad:  10.5 mmHg MV Mean grad:  4.0 mmHg     SHUNTS MV Vmax:       1.62 m/s     Systemic VTI:  0.37 m MV Vmean:      91.6 cm/s    Systemic Diam: 2.00 cm MV Decel Time: 236 msec  MV E velocity: 132.00 cm/s MV A velocity: 120.00 cm/s MV E/A ratio:  1.10 Kate Sable MD Electronically signed by Kate Sable MD Signature Date/Time: 01/11/2022/4:38:00 PM    Final    NM Pulmonary Perfusion  Addendum Date: 01/11/2022   ADDENDUM REPORT: 01/11/2022 14:54 ADDENDUM: Critical Value/emergent results were called by telephone at the time of interpretation on 01/11/2022 at 2:54 pm to provider [facility] , who verbally acknowledged these results. Electronically Signed   By: Zetta Bills M.D.   On: 01/11/2022 14:54   Result Date: 01/11/2022 CLINICAL DATA:  Positive D-dimer, shortness of breath and atrial fibrillation. EXAM: NUCLEAR MEDICINE PERFUSION LUNG SCAN TECHNIQUE: Perfusion images were obtained in multiple projections after intravenous injection of radiopharmaceutical. Ventilation scans intentionally deferred if perfusion scan and chest x-ray adequate for interpretation during COVID 19 epidemic. RADIOPHARMACEUTICALS:  4.28 mCi Tc-74mMAA IV COMPARISON:  January 11, 2022 FINDINGS: On the comparison  chest radiograph there is medial basilar airspace disease bilaterally. There is a wedge-shaped perfusion defect in the LEFT chest in the area of the lingula or anterior LEFT lower lobe not explain by the presence of airspace disease on the chest radiograph. There is attenuation of perfusion at the lung bases which is non wedge-shaped potentially related to airspace process. IMPRESSION: Positive for pulmonary embolism to lingula or anterior LEFT lower lobe. Mild attenuation of perfusion elsewhere of the lung bases related to underlying airspace process. A call is out to the referring provider to further discuss findings in the above case. Electronically Signed: By: GZetta BillsM.D. On: 01/11/2022 14:24     Medications:    anticoagulant sodium citrate Stopped (01/12/22 1237)    amiodarone  200 mg Oral Daily   Chlorhexidine Gluconate Cloth  6 each Topical Q0600   docusate sodium  100 mg Oral BID   doxycycline  100 mg Oral Q12H   feeding supplement (PRO-STAT SUGAR FREE 64)  30 mL Oral TID WC   hydrocortisone cream  1 Application Topical QID   lactulose  20 g Oral BID   lidocaine  2 patch Transdermal Q24H   multivitamin with minerals  1 tablet Oral Daily   pantoprazole  40 mg Oral BID   predniSONE  40 mg Oral Q breakfast   rifaximin  200 mg Oral BID   torsemide  40 mg Oral Once per day on Mon Wed Fri   acetaminophen, albuterol, alteplase, anticoagulant sodium citrate, dextromethorphan-guaiFENesin, diclofenac Sodium, heparin, hydrALAZINE, lidocaine (PF), lidocaine-prilocaine, nitroGLYCERIN, ondansetron (ZOFRAN) IV, pentafluoroprop-tetrafluoroeth, traMADol  Assessment/ Plan:  Ms. GSHAUNETTE GASSNERis a 81y.o.  female with past medical history including diastolic CHF, anemia, CAD, paroxysmal atrial fibrillation without anticoagulation.  GI bleed.  Chronic kidney disease stage IV.  Patient reports to ED with complaints of lower extremity edema.  [place]  [J44.1] Symptomatic anemia [D64.9]  [facility]/TTS/right chest PermCath   End-stage renal disease on hemodialysis.  Will maintain outpatient schedule if possible.  Dialysis 3 days in a row.  So far 6700 cc of fluid has been removed this admission.  Next hemodialysis planned for Saturday.     Anemia of chronic kidney disease Lab Results  Component Value Date   HGB 7.3 (L) 01/13/2022   Symptomatic anemia reported on admission.  Patient denies rectal bleeding or dark stools.  1 unit blood transfusion received in the ED.  [facility].   Received blood transfusion during dialysis.   3. Chronic diastolic  heart failure. ECHO from 12/08/21 shows EF 55-60% with a Grade II diastolic dysfunction with a mildly mitral valve regurgitation.  Torsemide '40mg'$  continued  on nondialysis days.    4.  Bone and mineral metabolism assessment Lab Results  Component Value Date   CALCIUM 8.4 (L) 01/13/2022   CAION 1.17 03/25/2016   PHOS 5.5 (H) 01/12/2022  We will continue to monitor    5.  Chronic severe lower extremity edema Volume removal with dialysis as tolerated.  See above.   LOS: 3 [place] 9/15/20231:35 PM

## 2022-01-13 NOTE — Progress Notes (Signed)
Report called to [employer] at [place].

## 2022-01-13 NOTE — Progress Notes (Signed)
Patient seen and examined at the bedside today.  She remains hemodynamically stable, comfortable, on room air. Stable for  discharge back to SNF today.  Discharge summary and orders are already in.  No new change in the medical management

## 2022-01-13 NOTE — Care Management Important Message (Signed)
Important Message  Patient Details  Name: April [name] MRN: [id number] Date of Birth: 10-Feb-1941   Medicare Important Message Given:  Yes     Loann Quill 01/13/2022, 1:26 PM

## 2022-01-13 NOTE — TOC Transition Note (Signed)
Transition of Care [facility]) - CM/SW Discharge Note   Patient Details  Name: April [name] MRN: [id number] Date of Birth: Dec 05, 1940  [facility] Care [facility] CM/SW Contact:  Magnus Ivan, LCSW Phone Number: 01/13/2022, 10:49 AM   Clinical Narrative:    Discharge to [facility] today. Room 332. Confirmed with Admissions Worker [place]. Updated MD, RN, and patient at bedside. Patient to update family. Asked RN to call report. Per MD, patient appropriate for wheelchair transport with o2. [facility] to pick up at 2 pm and call 2C when they arrive so someone can bring them down. Joelene Millin confirmed [facility]on Nay is bringing a portable o2 tank. Confirmed DNR is in DC packet.     Final next level of care: Skilled Nursing Facility Barriers to Discharge: Barriers Resolved   Patient Goals and CMS Choice Patient states their goals for this hospitalization and ongoing recovery are:: SNF CMS Medicare.gov Compare Post Acute Care list provided to:: Patient Choice offered to / list presented to : Patient  Discharge Placement              [facility] Patient to be transferred to facility by: [facility] to pick up at 2pm Name of family member notified: patient Patient and family notified of of transfer: 01/13/22  Discharge Plan and Services                                     Social Determinants of Health ([place]) Interventions     Readmission Risk Interventions    11/28/2021   11:17 AM 10/19/2021   12:27 PM  Readmission Risk Prevention Plan  Transportation Screening Complete Complete  PCP or Specialist Appt within 3-5 Days Complete Complete  HRI or Home Care Consult Complete Complete  Social Work Consult for [place] Planning/Counseling Complete Complete  Palliative Care Screening Not Applicable Complete  Medication Review [employer]) Complete Complete

## 2022-01-14 DIAGNOSIS — N189 Chronic kidney disease, unspecified: Secondary | ICD-10-CM | POA: Diagnosis not present

## 2022-01-14 DIAGNOSIS — N250 Renal osteodystrophy: Secondary | ICD-10-CM | POA: Diagnosis not present

## 2022-01-14 DIAGNOSIS — D509 Iron deficiency anemia, unspecified: Secondary | ICD-10-CM | POA: Diagnosis not present

## 2022-01-14 DIAGNOSIS — N179 Acute kidney failure, unspecified: Secondary | ICD-10-CM | POA: Diagnosis not present

## 2022-01-16 DIAGNOSIS — I2693 Single subsegmental pulmonary embolism without acute cor pulmonale: Secondary | ICD-10-CM | POA: Diagnosis not present

## 2022-01-16 DIAGNOSIS — N186 End stage renal disease: Secondary | ICD-10-CM | POA: Diagnosis not present

## 2022-01-16 DIAGNOSIS — I5022 Chronic systolic (congestive) heart failure: Secondary | ICD-10-CM | POA: Diagnosis not present

## 2022-01-16 DIAGNOSIS — I482 Chronic atrial fibrillation, unspecified: Secondary | ICD-10-CM | POA: Diagnosis not present

## 2022-01-16 DIAGNOSIS — Z992 Dependence on renal dialysis: Secondary | ICD-10-CM | POA: Diagnosis not present

## 2022-01-16 LAB — HEPATITIS B SURFACE ANTIBODY,QUALITATIVE

## 2022-01-16 LAB — HEPATITIS B CORE ANTIBODY, TOTAL

## 2022-01-17 DIAGNOSIS — N179 Acute kidney failure, unspecified: Secondary | ICD-10-CM | POA: Diagnosis not present

## 2022-01-17 DIAGNOSIS — N250 Renal osteodystrophy: Secondary | ICD-10-CM | POA: Diagnosis not present

## 2022-01-17 DIAGNOSIS — N189 Chronic kidney disease, unspecified: Secondary | ICD-10-CM | POA: Diagnosis not present

## 2022-01-17 DIAGNOSIS — D509 Iron deficiency anemia, unspecified: Secondary | ICD-10-CM | POA: Diagnosis not present

## 2022-01-17 LAB — HEPATITIS B SURFACE ANTIBODY, QUANTITATIVE: Hep B S AB Quant (Post): <3.1 m[IU]/mL (ref >9.9)

## 2022-01-17 LAB — HEPATITIS C ANTIBODY: HCV Ab: NONREACTIVE — AB

## 2022-01-17 NOTE — Progress Notes (Unsigned)
Patient ID: April [name], female    DOB: 1941/01/26, 81 y.o.   MRN: [id number]  HPI  April [name] is a 81 y/o female with a history of CAD, DM, hyperlipidemia, HTN, CKD, anemia, diverticulitis, endometriosis, GERD, GIB, LBBB, PAF, sleep apnea, spasmodic dysphonia and chronic heart failure.   Echo report from 01/11/22 reviewed and showed an EF of 55-60% along with moderately elevated PA pressure, severe LAE, moderate TR and mild/moderate MR. Echo report from 08/10/21 reviewed and showed an EF of 60-65% along with mildly elevated PA pressure, mild LAE, mild MR and moderate TR.   LHC done 03/25/16 and showed: Prox RCA lesion, 80 %stenosed. A STENT PROMUS PREM MR 3.5X16 (3.8-4.1 mm) drug eluting stent was successfully placed. Post intervention, there is a 0% residual stenosis. Ost RPDA lesion, 90 %stenosed. A STENT PROMUS PREM MR 2.5X12 (2.7 mm) drug eluting stent was successfully placed. Post intervention, there is a 0% residual stenosis. __________________________________ Prox RCA to Mid RCA lesion, 50 %stenosed. Dist RCA lesion, 30 %stenosed.- Appears smooth and chronic Dist LAD lesion, 25 %stenosed - diffuse disease in the distal LAD. Nonsignificant The left ventricular systolic function is normal. The left ventricular ejection fraction is 55-65% by visual estimate. There is no mitral valve regurgitation. There is no aortic valve stenosis.  Admitted 01/09/22 due to shortness breath for last several days, associated with cough with little mucus production.  She was also experiencing some wheezing, no chest pain, fever or chills. Given steroids and antibiotics for COPD. Given 1 unit of blood due to anemia. VQ scan positive for PE. CT angiogram showed near occlusive pulmonary embolism in a lingular pulmonary artery without evidence of right heart strain. Heparin drip started but then stopped due to anemia. Dialysis continued. Palliative care consult obtained. Discharged after 4 days. Admitted 11/23/21  abdominal and leg swelling and weight gain. Dialysis started after failing to respond to diuretics. 1 unit of blood given. Permacath placed. Discharged after 26 days. Admitted 10/17/21 due to worsening SOB along with generalized weakness. Recently had been started on metolazone. Cardiology consult obtained. Given IVF due to AKI. PT/OT evaluations done. Discharged after 4 days. Admitted 08/08/21 due to worsening shortness of breath, weight gain and abdominal swelling. Initially given IV lasix with transition to oral diuretics. Cardiology and palliative care consults obtained. Given 1 unit PRBC's due to anemia. Hypokalemia corrected. OT/PT evaluations done. Discharged after 8 days.   She presents today for a follow-up visit with a chief complaint of moderate fatigue with minimal exertion. Describes this as chronic in nature. She has associated cough, shortness of breath, pedal edema, tremors and weakness along with this. She denies any difficulty sleeping, abdominal distention, palpitations, chest pain, wheezing, dizziness or weight gain.   Getting dialysis on TTS via permacath in her right upper anterior chest wall. Says that some days her pedal edema looks good and other days, not so good.   Note received from facility asking for CBC & BMP to be drawn today.    Past Medical History:  Diagnosis Date   Anemia    CAD S/P PCI pRCA Promus DES 3.5 x 16 (4.1 mm), ostRPDA Promus DES 2.5 x 12 (2.7 mm) 03/25/2016   CAD S/P percutaneous coronary angioplasty    a. 07/2015 MV: No ischemia, EF 66%;  b. 03/2016 Inflat STEMI/PCI: LM nl, LAD 25d, RI nl, LCX nl, OM1/2 nl, RCA 80p (3.5x16 Promus Premier DES), 50p/m, 30d, RPDA 90 (2.5x12 Promu Premier DES); c. 01/2018 Cath ([place] [facility]  [place]): Patent RCA stents->Med Rx.   CHF (congestive heart failure) ([place])    CKD (chronic kidney disease), stage III ([place])    Diabetes mellitus without complication ([place])    Diastolic dysfunction    a. 10/2013 Echo: EF 55-65%,  Gr1 DD; b. 01/2018 Echo ([place], [place]): EF 55-60%, Gr2 DD, RVSP 60mHg.   Diverticulitis    Dyspnea    Endometriosis    Family history of adverse reaction to anesthesia    Mother - had to stay in ICU because of breathing difficulties every time she had anesthesia   GERD (gastroesophageal reflux disease)    GI bleed    a. 06/2021 - melena-->2 u PRBCs.   Hiatal hernia    History of colon polyps 08/1994   History of Migraines    Hypercholesterolemia    Hypertension    LBBB (left bundle branch block)    Osteoporosis    PAF (paroxysmal atrial fibrillation) ([place]    a. 03/2016 Dx @ time of MI; b. CHA2DS2VASc = 5-->Eliquis.   Rheumatic fever    Sleep apnea    CPAP   Spasmodic dysphonia    ST elevation myocardial infarction (STEMI) of inferolateral wall, initial episode of care ([place] 03/25/2016   ST elevation myocardial infarction (STEMI) of inferolateral wall, initial episode of care ([facility] 03/25/2016   Urine incontinence    Past Surgical History:  Procedure Laterality Date   ABDOMINAL HYSTERECTOMY     ovaries not removed   APPENDECTOMY     was removed during hysterectomy   Breast biopsies     x2   BREAST EXCISIONAL BIOPSY Bilateral "years ago"   neg   BREAST SURGERY Bilateral    BROW LIFT Bilateral 08/15/2019   Procedure: BLEPHAROPLASTY UPPER EYELID; W/EXCESS SKIN BLEPHAROPTOSIS REPAIR; RESECT EX;  Surgeon: FKarle Starch MD;  [facility] [facility]  Service: Ophthalmology;  Laterality: Bilateral;  sleep apnea   CARDIAC CATHETERIZATION  2011   moderate 40% RCA disease   CARDIAC CATHETERIZATION  01/2010   Dr. Gollan'@[facility]'$ : Only noted 40% RCA   CARDIAC CATHETERIZATION N/A 03/25/2016   Procedure: Left Heart Cath and Coronary Angiography;  Surgeon: D12/11/2015 MD;  [facility];  Service: Cardiovascular;  Laterality: N/A;   CARDIAC CATHETERIZATION N/A 03/25/2016   Procedure: Coronary Stent Intervention;  Surgeon: D12/11/2015 MD;   [facility];  Service: Cardiovascular;  Laterality: N/A;   COLONOSCOPY  2013   COLONOSCOPY WITH PROPOFOL N/A 07/11/2021   Procedure: COLONOSCOPY WITH PROPOFOL;  Surgeon: W3/26/2023 MD;  [facility];  Service: Endoscopy;  Laterality: N/A;   DIALYSIS/PERMA CATHETER INSERTION N/A 12/15/2021   Procedure: DIALYSIS/PERMA CATHETER INSERTION;  Surgeon: D8/30/2023 MD;  [facility];  Service: Cardiovascular;  Laterality: N/A;   ESOPHAGOGASTRODUODENOSCOPY (EGD) WITH PROPOFOL N/A 03/17/2015   Procedure: ESOPHAGOGASTRODUODENOSCOPY (EGD) WITH PROPOFOL;  Surgeon: S11/29/2016 MD;  Location: [facility] END[facility]COPY;  Service: Gastroenterology;  Laterality: N/A;   ESOPHAGOGASTRODUODENOSCOPY (EGD) WITH PROPOFOL N/A 07/10/2021   Procedure: ESOPHAGOGASTRODUODENOSCOPY (EGD) WITH PROPOFOL;  Surgeon: V16/03/2022 MD;  [facility];  Service: Gastroenterology;  Laterality: N/A;   GIVENS CAPSULE STUDY N/A 07/11/2021   Procedure: GIVENS CAPSULE STUDY;  Surgeon: W3/26/2023 MD;  [facility];  Service: Endoscopy;  Laterality: N/A;   [place]  07/2015   No evidence ischemia or infarction. EF 66%. Low risk   TEMPORARY DIALYSIS CATHETER N/A 12/08/2021  Procedure: TEMPORARY DIALYSIS CATHETER;  Surgeon: Algernon Huxley, MD;  Location: [place] [facility];  Service: Cardiovascular;  Laterality: N/A;   TONSILLECTOMY     TRANSTHORACIC ECHOCARDIOGRAM  10/2013   Normal LV size and function. EF 55-65%. GR 1 DD. Otherwise normal.   Family History  Problem Relation Age of Onset   Arthritis Mother    Stroke Mother    Hypertension Mother    Osteoporosis Mother    Heart disease Father        MI   Heart attack Father    Stroke Brother    Heart attack Sister    Breast cancer Other        first cousin x 2   Stroke Daughter    Heart attack Daughter    Colon cancer Neg Hx    Social History   Tobacco Use   Smoking status: Never    Smokeless tobacco: Never  Substance Use Topics   Alcohol use: No    Alcohol/week: 0.0 standard drinks of alcohol   Allergies  Allergen Reactions   Penicillins Other (See Comments)    Okay to take amoxicillin/(pt does not recall what the reaction to penicillin was (26-97 years old)    Sulfa Antibiotics Rash   Prior to Admission medications   Medication Sig Start Date End Date Taking? Authorizing Provider  acetaminophen (TYLENOL) 325 MG tablet Take 650 mg by mouth every 4 (four) hours as needed for mild pain or fever.   Yes [provider]  albuterol (VENTOLIN HFA) 108 (90 Base) MCG/ACT inhaler Inhale 2 puffs into the lungs every 6 (six) hours as needed for wheezing or shortness of breath. 01/11/22  Yes Shelly Coss, MD  Amino Acids-Protein Hydrolys (FEEDING SUPPLEMENT, PRO-STAT SUGAR FREE 64,) LIQD Take 30 mLs by mouth 3 (three) times daily with meals.   Yes [provider]  amiodarone (PACERONE) 200 MG tablet Take 1 tablet (200 mg total) by mouth daily. 12/20/21  Yes Nolberto Hanlon, MD  diclofenac Sodium (VOLTAREN) 1 % GEL Apply 2 g topically 3 (three) times daily as needed (pain in legs and elbows). 12/19/21  Yes Nolberto Hanlon, MD  docusate sodium (STOOL SOFTENER) 100 MG capsule Take 1 capsule (100 mg total) by mouth 2 (two) times daily. 12/19/21  Yes Nolberto Hanlon, MD  famotidine (PEPCID) 20 MG tablet Take 20 mg by mouth 2 (two) times daily.   Yes [provider]  ipratropium-albuterol (DUONEB) 0.5-2.5 (3) MG/3ML SOLN Take 3 mLs by nebulization every 6 (six) hours as needed. 01/11/22  Yes Shelly Coss, MD  lactulose (CHRONULAC) 10 GM/15ML solution Take 30 mLs (20 g total) by mouth 2 (two) times daily. 12/19/21  Yes Nolberto Hanlon, MD  lidocaine (LIDODERM) 5 % Place 2 patches onto the skin daily. Remove & Discard patch within 12 hours or as directed by MD Patient taking differently: Place 1 patch onto the skin daily. Remove & Discard patch within 12 hours or as directed  by MD 12/19/21  Yes Nolberto Hanlon, MD  Multiple Vitamins-Minerals (MULTIVITAMIN WITH MINERALS) tablet Take 1 tablet by mouth daily.   Yes [provider]  nitroGLYCERIN (NITROSTAT) 0.4 MG SL tablet Place 1 tablet (0.4 mg total) under the tongue every 5 (five) minutes as needed for chest pain. 12/19/21  Yes Nolberto Hanlon, MD  rifaximin (XIFAXAN) 200 MG tablet Take 1 tablet (200 mg total) by mouth 2 (two) times daily. 12/19/21  Yes Nolberto Hanlon, MD  torsemide 40 MG TABS Take 40 mg  by mouth 3 (three) times a week. 12/21/21  Yes Nolberto Hanlon, MD   Review of Systems  Constitutional:  Positive for appetite change (decreased) and fatigue (easily).  HENT:  Negative for congestion, postnasal drip and sore throat.   Eyes: Negative.   Respiratory:  Positive for cough (intermittent) and shortness of breath. Negative for chest tightness and wheezing.   Cardiovascular:  Positive for leg swelling. Negative for chest pain and palpitations.  Gastrointestinal:  Negative for abdominal distention and abdominal pain.  Endocrine: Negative.   Genitourinary: Negative.   Musculoskeletal:  Negative for back pain and neck pain.  Skin: Negative.   Allergic/Immunologic: Negative.   Neurological:  Positive for tremors and weakness. Negative for dizziness and light-headedness.  Hematological:  Negative for adenopathy. Does not bruise/bleed easily.  Psychiatric/Behavioral:  Negative for dysphoric mood and sleep disturbance (sleeping with HOB elevated;wearing CPAP nightly). The patient is not nervous/anxious.    Vitals:   01/18/22 1319  BP: (!) 128/57  Pulse: 71  Resp: 16  SpO2: 100%  Weight: 175 lb 4 oz (79.5 kg)  Height: '4\' 9"'$  (1.448 m)   Wt Readings from Last 3 Encounters:  01/18/22 175 lb 4 oz (79.5 kg)  01/13/22 171 lb 8.3 oz (77.8 kg)  01/04/22 184 lb (83.5 kg)   Lab Results  Component Value Date   CREATININE 3.23 (H) 01/13/2022   CREATININE 4.36 (H) 01/12/2022   CREATININE 5.02 (H) 01/10/2022    Physical Exam Vitals and nursing note reviewed.  Constitutional:      Appearance: She is well-developed.  HENT:     Head: Normocephalic and atraumatic.  Neck:     Vascular: No JVD.  Cardiovascular:     Rate and Rhythm: Normal rate and regular rhythm.  Pulmonary:     Effort: Pulmonary effort is normal.     Breath sounds: Normal breath sounds. No wheezing, rhonchi or rales.  Abdominal:     Palpations: Abdomen is soft.     Tenderness: There is no abdominal tenderness.  Musculoskeletal:        General: No tenderness.     Cervical back: Normal range of motion and neck supple.     Right lower leg: No tenderness. Edema (2+ pitting) present.     Left lower leg: No tenderness. Edema (2+ pitting) present.  Skin:    General: Skin is warm and dry.     Comments: Bandage over right upper chest wall catheter  Neurological:     General: No focal deficit present.     Mental Status: She is alert and oriented to person, place, and time.  Psychiatric:        Mood and Affect: Mood normal.        Behavior: Behavior normal.     Assessment & Plan:  1: Chronic heart failure with preserved ejection fraction without structural changes - - NYHA class III - euvolemic today - being weighed daily and facility weight chart reviewed; weight ranges from 174.3-172.3 over the last 5 days; reminded to call for an overnight weight gain of > 2 pounds or a weekly weight gain of > 5 pounds - weight up 6 pounds from last visit here 2 months ago - not adding salt and trying to eat low sodium foods; doesn't feel like the food at assisted living facility tastes salty - keep daily fluid intake to ~ 60 ounces/ daily - currently at [facility] rehab - BNP 11/24/21 was 368.4 - check BMP/CBC today; will fax results to [facility]  2: HTN- - BP looks good (128/57) - saw PCP Nicki Reaper) 11/17/21; currently seeing PCP at SNF - BMP 01/13/22 reviewed and showed sodium 134, potassium 3.6, creatinine 3.23 and GFR 14  3: PAF- -  saw cardiology Quentin Ore) 11/23/21 - currently on amiodarone  4: Anemia- - saw GI Allen [name]) 07/21/21 - saw hematology Rogue Bussing) 01/04/22 - Hg 01/13/22 was 7.3  5: DM with ESRD- - A1c 09/21/21 was 6.3% - saw nephrology Candiss Norse) 11/18/21 - doing dialysis on TTS - palliative care visit done 12/21/21  6: PE- - wearing CPAP nightly - saw pulmonology Lanney Gins) 10/11/21    Facility medication list reviewed.   Due to dialysis, will not make a return appointment at this time. Did advise patient that she could call for any questions or if she would like to make another appointment and she was comfortable with this plan.

## 2022-01-18 ENCOUNTER — Ambulatory Visit (HOSPITAL_BASED_OUTPATIENT_CLINIC_OR_DEPARTMENT_OTHER): Payer: Medicare Other | Admitting: Family

## 2022-01-18 ENCOUNTER — Encounter: Payer: Self-pay | Admitting: Family

## 2022-01-18 ENCOUNTER — Other Ambulatory Visit
Admission: RE | Admit: 2022-01-18 | Discharge: 2022-01-18 | Disposition: A | Discharge status: Home / Self Care | Payer: Medicare Other | Source: Ambulatory Visit | Attending: Family | Admitting: Family

## 2022-01-18 VITALS — BP 128/57 | HR 71 | Resp 16 | Ht <= 58 in | Wt 175.2 lb

## 2022-01-18 DIAGNOSIS — N186 End stage renal disease: Secondary | ICD-10-CM

## 2022-01-18 DIAGNOSIS — I5032 Chronic diastolic (congestive) heart failure: Secondary | ICD-10-CM

## 2022-01-18 DIAGNOSIS — Z992 Dependence on renal dialysis: Secondary | ICD-10-CM

## 2022-01-18 DIAGNOSIS — I2782 Chronic pulmonary embolism: Secondary | ICD-10-CM | POA: Diagnosis not present

## 2022-01-18 DIAGNOSIS — D631 Anemia in chronic kidney disease: Secondary | ICD-10-CM | POA: Insufficient documentation

## 2022-01-18 DIAGNOSIS — I480 Paroxysmal atrial fibrillation: Secondary | ICD-10-CM

## 2022-01-18 DIAGNOSIS — E785 Hyperlipidemia, unspecified: Secondary | ICD-10-CM | POA: Insufficient documentation

## 2022-01-18 DIAGNOSIS — E1122 Type 2 diabetes mellitus with diabetic chronic kidney disease: Secondary | ICD-10-CM | POA: Insufficient documentation

## 2022-01-18 DIAGNOSIS — Z86711 Personal history of pulmonary embolism: Secondary | ICD-10-CM | POA: Insufficient documentation

## 2022-01-18 DIAGNOSIS — I10 Essential (primary) hypertension: Secondary | ICD-10-CM

## 2022-01-18 DIAGNOSIS — D649 Anemia, unspecified: Secondary | ICD-10-CM

## 2022-01-18 DIAGNOSIS — I132 Hypertensive heart and chronic kidney disease with heart failure and with stage 5 chronic kidney disease, or end stage renal disease: Secondary | ICD-10-CM | POA: Insufficient documentation

## 2022-01-18 DIAGNOSIS — Z9861 Coronary angioplasty status: Secondary | ICD-10-CM | POA: Diagnosis not present

## 2022-01-18 DIAGNOSIS — J449 Chronic obstructive pulmonary disease, unspecified: Secondary | ICD-10-CM | POA: Insufficient documentation

## 2022-01-18 DIAGNOSIS — R0602 Shortness of breath: Secondary | ICD-10-CM | POA: Insufficient documentation

## 2022-01-18 DIAGNOSIS — I2510 Atherosclerotic heart disease of native coronary artery without angina pectoris: Secondary | ICD-10-CM | POA: Diagnosis not present

## 2022-01-18 LAB — CBC
HCT: 26.6 % — ABNORMAL LOW (ref 36.0–46.0)
Hemoglobin: 8.7 g/dL — ABNORMAL LOW (ref 12.0–15.0)
MCH: 31.1 pg (ref 26.0–34.0)
MCHC: 32.7 g/dL (ref 30.0–36.0)
MCV: 95 fL (ref 80.0–100.0)
Platelets: 87 10*3/uL — ABNORMAL LOW (ref 150–400)
RBC: 2.8 MIL/uL — ABNORMAL LOW (ref 3.87–5.11)
RDW: 19.9 % — ABNORMAL HIGH (ref 11.5–15.5)
WBC: 18.1 10*3/uL — ABNORMAL HIGH (ref 4.0–10.5)
nRBC: 0.2 % (ref 0.0–0.2)

## 2022-01-18 LAB — BASIC METABOLIC PANEL
Anion gap: 8 (ref 5–15)
BUN: 58 mg/dL — ABNORMAL HIGH (ref 8–23)
CO2: 25 mmol/L (ref 22–32)
Calcium: 8.6 mg/dL — ABNORMAL LOW (ref 8.9–10.3)
Chloride: 102 mmol/L (ref 98–111)
Creatinine, Ser: 3.12 mg/dL — ABNORMAL HIGH (ref 0.44–1.00)
GFR, Estimated: 14 mL/min — ABNORMAL LOW (ref >60)
Glucose, Bld: 172 mg/dL — ABNORMAL HIGH (ref 70–99)
Potassium: 3.2 mmol/L — ABNORMAL LOW (ref 3.5–5.1)
Sodium: 135 mmol/L (ref 135–145)

## 2022-01-18 NOTE — Patient Instructions (Addendum)
Continue weighing daily and call for an overnight weight gain of 3 pounds or more or a weekly weight gain of more than 5 pounds.   If you have voicemail, please make sure your mailbox is cleaned out so that we may leave a message and please make sure to listen to any voicemails.    Call us in the future if you need [place] for anything   I will fax lab results to [facility] when I get them back

## 2022-01-18 NOTE — Progress Notes (Signed)
Cardiology Office Note    Date:  01/20/2022   ID:  April [name], DOB 09-18-1940, MRN [id number]  PCP:  Einar Pheasant, MD  Cardiologist:  Ida Rogue, MD  Electrophysiologist:  Vickie Epley, MD   Chief Complaint: Hospital follow-up  History of Present Illness:   April [name] is a 81 y.o. female with history of CAD status post prior RCA and PDA stenting, HFpEF, PAF no longer on [place] as outlined below, ESRD on HD, symptomatic anemia with recurrent GI bleeding, recently diagnosed PE not on anticoagulation, COPD, cirrhosis, HTN, HLD, and sleep apnea who presents for hospital follow-up, following multiple hospital admissions as outlined below.  She was admitted to the hospital in 01/2018 in [place] with chest discomfort and mild troponin elevation.  Echo demonstrated normal LV systolic function with grade 2 diastolic dysfunction.  LHC showed patent RCA stents and otherwise no targets for intervention.  Over the years, she has had issues with chronic lower extremity swelling and lymphedema for which she has been using wraps and lymphedema pumps.   She was admitted to [facility] in 06/2021 with symptomatic anemia with melena. HGB dropped to 7.2.  She required 2 units pRBC and Eliquis was held. EGD was performed and showed mildly erythematous gastric mucosa without obvious bleeding.  This was followed by colonoscopy which showed no source of bleeding, polyps (pathology negative for malignancy), nonbleeding internal hemorrhoids, and diverticulosis.  Capsule endoscopy was subsequently carried out.  EGD biopsies were negative for H. pylori. She was discharged home off of Eliquis. She was seen by GI on 07/21/2021, at which time H&H were stable. Note indicates that she was cleared to resume anticoagulation.    She was seen in our office on 07/26/2021, and was noted to be volume up with associated lower extremity swelling and dyspnea.  She reported a 10 pound weight gain.  It was noted she remained  net positive throughout her prior admission in the context of IV fluids, PRBC, and holding of diuretic therapy because of AKI.  In an effort to attempt to prevent readmission, her Lasix was titrated.  She was restarted on Eliquis 5 mg twice daily.  She was treated for lower extremity cellulitis as well.   She was admitted to [facility] in 07/2021 with recurrent melena as well as a small amount of blood-streaked reflux leading her Eliquis to be held at that time. She was evaluated by GI and underwent enteroscopy and capsule endoscopy which showed a large hiatal hernia, patchy erythematous and petechial mucosa with erosion in the stomach, the duodenum, and jejunum appeared normal.  H. pylori stool antigen was negative. GI subsequently cleared the patient to resume Eliquis, though this was held at discharge, at patient request. She contacted our office on 4/6 concerned about this, after discussion with her primary cardiologist, it was recommended she resume Eliquis at 2.5 mg bid and hold ASA.    She was admitted to [facility] on 08/08/2021 with acute on chronic HFpEF, recurrent melena, and anemia.  Echo during that admission showed an EF of 60 to 65%, no regional wall motion abnormalities, grade 2 diastolic dysfunction, normal RV systolic function with mildly enlarged ventricular cavity size, mildly elevated PASP, mild biatrial enlargement, mild mitral regurgitation, moderate tricuspid regurgitation, and an estimated right atrial pressure of 3 mmHg.  She had symptomatic improvement with diuresis.  Apixaban was discontinued given 2 recent admissions for GI bleeding requiring recurrent blood transfusion.  She also remained off aspirin in this setting.  She was admitted in 09/2021 with dyspnea, weakness, and severe hypokalemia.  She was evaluated by EP in 10/2021 and noted to be significantly volume overloaded with subsequent hospital admission.  With regards to her A-fib and anticoagulation, EP did not feel the patient  was a candidate for even short-term anticoagulation, making [place] more risky.  During her admission in 10/2021, IV diuresis was attempted, though she had declining renal function as well as hepatic encephalopathy with associated ascites requiring initiation of dialysis.  She again required PRBC transfusion.  Echo during that admission showed an EF of 55 to 60%, no regional wall motion abnormalities, grade 2 diastolic dysfunction, normal RV systolic function and ventricular cavity size, mildly elevated PASP, mildly dilated left atrium, and aortic valve sclerosis without evidence of stenosis.  Her volume overload was felt to be multifactorial including from cirrhosis, third spacing from hypoalbuminemia and symptomatic anemia, as well as worsening renal failure.  She was most recently admitted to [facility] from 9/11 through 01/13/2022 with acute onset of shortness of breath and was found to have acute hypoxic respiratory failure secondary to an acute PE with near occlusive pulmonary embolism and a lingular pulmonary artery without evidence of right heart strain, as well as sepsis secondary to COPD exacerbation complicated by symptomatic anemia of 6 requiring 1 unit of PRBC during the admission.  For her PE, she was initially started on a heparin drip, though this was stopped due to downtrending hemoglobin requiring PRBC.  Due to high risk, she was not placed on anticoagulation with recommendation for palliative care follow-up.  She reported a history of chronic black stools with prior GI evaluation being unrevealing with recommendation for outpatient GI follow-up.  High-sensitivity troponin was minimally elevated during her admission, peaking at 25.  Echo during the admission demonstrated an EF of 55 to 60%, no regional wall motion abnormalities, normal RV systolic function and ventricular cavity size, moderately elevated PASP, severely dilated left atrium, mildly dilated right atrium, mild to moderate mitral  regurgitation, moderate tricuspid regurgitation, and an estimated right atrial pressure of 8 mmHg.  She comes in and is doing reasonably well from a cardiac perspective.  She is without symptoms concerning for angina or decompensation.  She continues to note significant lower extremity swelling, and feels like her volume status is slowly improving through hemodialysis.  No dyspnea, palpitations, dizziness, presyncope, or syncope.  She also notes her stool is now more of a normal color and less melanotic.   Labs independently reviewed: 12/2021 - potassium 3.6, BUN 52, serum creatinine 3.23, Hgb 7.3, PLT 94, albumin 2.6 11/2021 - magnesium 2.0, AST 93, ALT normal 08/2021 - A1c 6.3, TSH normal 12/2020 - TC 129, TG 80, HDL 50, LDL 62  Past Medical History:  Diagnosis Date   Anemia    CAD S/P PCI pRCA Promus DES 3.5 x 16 (4.1 mm), ostRPDA Promus DES 2.5 x 12 (2.7 mm) 03/25/2016   CAD S/P percutaneous coronary angioplasty    a. 07/2015 MV: No ischemia, EF 66%;  b. 03/2016 Inflat STEMI/PCI: LM nl, LAD 25d, RI nl, LCX nl, OM1/2 nl, RCA 80p (3.5x16 Promus Premier DES), 50p/m, 30d, RPDA 90 (2.5x12 Promu Premier DES); c. 01/2018 Cath ([place], [place]): Patent RCA stents->Med Rx.   CHF (congestive heart failure) ([place])    CKD (chronic kidney disease), stage III ([place])    Diabetes mellitus without complication ([place])    Diastolic dysfunction    a. 10/2013 Echo: EF 55-65%, Gr1 DD; b. 01/2018  Echo ([place], [place]): EF 55-60%, Gr2 DD, RVSP 42mHg.   Diverticulitis    Dyspnea    Endometriosis    Family history of adverse reaction to anesthesia    Mother - had to stay in ICU because of breathing difficulties every time she had anesthesia   GERD (gastroesophageal reflux disease)    GI bleed    a. 06/2021 - melena-->2 u PRBCs.   Hiatal hernia    History of colon polyps 08/1994   History of Migraines    Hypercholesterolemia    Hypertension    LBBB (left bundle branch block)     Osteoporosis    PAF (paroxysmal atrial fibrillation) ([place]    a. 03/2016 Dx @ time of MI; b. CHA2DS2VASc = 5-->Eliquis.   Rheumatic fever    Sleep apnea    CPAP   Spasmodic dysphonia    ST elevation myocardial infarction (STEMI) of inferolateral wall, initial episode of care ([place] 03/25/2016   ST elevation myocardial infarction (STEMI) of inferolateral wall, initial episode of care ([facility] 03/25/2016   Urine incontinence     Past Surgical History:  Procedure Laterality Date   ABDOMINAL HYSTERECTOMY     ovaries not removed   APPENDECTOMY     was removed during hysterectomy   Breast biopsies     x2   BREAST EXCISIONAL BIOPSY Bilateral "years ago"   neg   BREAST SURGERY Bilateral    BROW LIFT Bilateral 08/15/2019   Procedure: BLEPHAROPLASTY UPPER EYELID; W/EXCESS SKIN BLEPHAROPTOSIS REPAIR; RESECT EX;  Surgeon: FKarle Starch MD;  [place]  Service: Ophthalmology;  Laterality: Bilateral;  sleep apnea   CARDIAC CATHETERIZATION  2011   moderate 40% RCA disease   CARDIAC CATHETERIZATION  01/2010   Dr. Gollan'@[facility]'$ : Only noted 40% RCA   CARDIAC CATHETERIZATION N/A 03/25/2016   Procedure: Left Heart Cath and Coronary Angiography;  Surgeon: D12/11/2015 MD;  [place];  Service: Cardiovascular;  Laterality: N/A;   CARDIAC CATHETERIZATION N/A 03/25/2016   Procedure: Coronary Stent Intervention;  Surgeon: D12/11/2015 MD;  [place] [facility];  Service: Cardiovascular;  Laterality: N/A;   COLONOSCOPY  2013   COLONOSCOPY WITH PROPOFOL N/A 07/11/2021   Procedure: COLONOSCOPY WITH PROPOFOL;  Surgeon: W3/26/2023 MD;  [facility];  Service: Endoscopy;  Laterality: N/A;   DIALYSIS/PERMA CATHETER INSERTION N/A 12/15/2021   Procedure: DIALYSIS/PERMA CATHETER INSERTION;  Surgeon: D8/30/2023 MD;  [place] [facility];  Service: Cardiovascular;  Laterality: N/A;   ESOPHAGOGASTRODUODENOSCOPY (EGD) WITH PROPOFOL N/A  03/17/2015   Procedure: ESOPHAGOGASTRODUODENOSCOPY (EGD) WITH PROPOFOL;  Surgeon: S11/29/2016 MD;  Location: [facility];  Service: Gastroenterology;  Laterality: N/A;   ESOPHAGOGASTRODUODENOSCOPY (EGD) WITH PROPOFOL N/A 07/10/2021   Procedure: ESOPHAGOGASTRODUODENOSCOPY (EGD) WITH PROPOFOL;  Surgeon: V16/03/2022 MD;  [facility];  Service: Gastroenterology;  Laterality: N/A;   GIVENS CAPSULE STUDY N/A 07/11/2021   Procedure: GIVENS CAPSULE STUDY;  Surgeon: W3/26/2023 MD;  [facility];  Service: Endoscopy;  Laterality: N/A;   NM MYOVIEW ([place])  07/2015   No evidence ischemia or infarction. EF 66%. Low risk   TEMPORARY DIALYSIS CATHETER N/A 12/08/2021   Procedure: TEMPORARY DIALYSIS CATHETER;  Surgeon: D21/01/2022 MD;  [place] [facility];  Service: Cardiovascular;  Laterality: N/A;   TONSILLECTOMY     TRANSTHORACIC ECHOCARDIOGRAM  10/2013   Normal LV size and function. EF 55-65%. GR 1 DD.  Otherwise normal.    Current Medications: Current Meds  Medication Sig   acetaminophen (TYLENOL) 325 MG tablet Take 650 mg by mouth every 4 (four) hours as needed for mild pain or fever.   albuterol (VENTOLIN HFA) 108 (90 Base) MCG/ACT inhaler Inhale 2 puffs into the lungs every 6 (six) hours as needed for wheezing or shortness of breath.   Amino Acids-Protein Hydrolys (FEEDING SUPPLEMENT, PRO-STAT SUGAR FREE 64,) LIQD Take 30 mLs by mouth 3 (three) times daily with meals.   amiodarone (PACERONE) 200 MG tablet Take 1 tablet (200 mg total) by mouth daily.   diclofenac Sodium (VOLTAREN) 1 % GEL Apply 2 g topically 3 (three) times daily as needed (pain in legs and elbows).   docusate sodium (STOOL SOFTENER) 100 MG capsule Take 1 capsule (100 mg total) by mouth 2 (two) times daily.   famotidine (PEPCID) 20 MG tablet Take 20 mg by mouth 2 (two) times daily.   ipratropium-albuterol (DUONEB) 0.5-2.5 (3) MG/3ML SOLN Take 3 mLs by nebulization every 6  (six) hours as needed.   lactulose (CHRONULAC) 10 GM/15ML solution Take 30 mLs (20 g total) by mouth 2 (two) times daily.   lidocaine (LIDODERM) 5 % Place 2 patches onto the skin daily. Remove & Discard patch within 12 hours or as directed by MD (Patient taking differently: Place 1 patch onto the skin daily. Remove & Discard patch within 12 hours PRN or as directed by MD)   Multiple Vitamins-Minerals (MULTIVITAMIN WITH MINERALS) tablet Take 1 tablet by mouth daily.   nitroGLYCERIN (NITROSTAT) 0.4 MG SL tablet Place 1 tablet (0.4 mg total) under the tongue every 5 (five) minutes as needed for chest pain.   rifaximin (XIFAXAN) 200 MG tablet Take 1 tablet (200 mg total) by mouth 2 (two) times daily.   torsemide 40 MG TABS Take 40 mg by mouth 3 (three) times a week.    Allergies:   Penicillins and Sulfa antibiotics   Social History   Socioeconomic History   Marital status: Widowed    Spouse name: Not on file   Number of children: 2   Years of education: Not on file   Highest education level: Not on file  Occupational History   Not on file  Tobacco Use   Smoking status: Never   Smokeless tobacco: Never  Vaping Use   Vaping Use: Never used  Substance and Sexual Activity   Alcohol use: No    Alcohol/week: 0.0 standard drinks of alcohol   Drug use: No   Sexual activity: Not Currently    Birth control/protection: Post-menopausal  Other Topics Concern   Not on file  Social History Narrative   Not on file   Social Determinants of Health   Financial Resource Strain: Not on file  Food Insecurity: Not on file  Transportation Needs: Not on file  Physical Activity: Not on file  Stress: Not on file  Social Connections: Not on file     Family History:  The patient's family history includes Arthritis in her mother; Breast cancer in an other family member; Heart attack in her daughter, father, and sister; Heart disease in her father; Hypertension in her mother; Osteoporosis in her mother;  Stroke in her brother, daughter, and mother. There is no history of Colon cancer.  ROS:   12-point review of systems is negative unless otherwise noted in the HPI.   EKGs/Labs/Other Studies Reviewed:    Studies reviewed were summarized above. The additional studies were reviewed today:  2D  echo 01/11/2022: 1. Left ventricular ejection fraction, by estimation, is 55 to 60%. The  left ventricle has normal function. The left ventricle has no regional  wall motion abnormalities. Left ventricular diastolic parameters are  indeterminate.   2. Right ventricular systolic function is normal. The right ventricular  size is normal. There is moderately elevated pulmonary artery systolic  pressure.   3. Left atrial size was severely dilated.   4. Right atrial size was mildly dilated.   5. The mitral valve is normal in structure. Mild to moderate mitral valve  regurgitation.   6. Tricuspid valve regurgitation is moderate.   7. The aortic valve is tricuspid. Aortic valve regurgitation is not  visualized.   8. The inferior vena cava is normal in size with <50% respiratory  variability, suggesting right atrial pressure of 8 mmHg. __________  2D echo 12/08/2021: 1. Left ventricular ejection fraction, by estimation, is 55 to 60%. The  left ventricle has normal function. The left ventricle has no regional  wall motion abnormalities. Left ventricular diastolic parameters are  consistent with Grade II diastolic  dysfunction (pseudonormalization).   2. Right ventricular systolic function is normal. The right ventricular  size is normal. There is mildly elevated pulmonary artery systolic  pressure. The estimated right ventricular systolic pressure is 31.4 mmHg.   3. Left atrial size was mildly dilated.   4. The mitral valve is normal in structure. No evidence of mitral valve  regurgitation. No evidence of mitral stenosis.   5. The aortic valve is normal in structure. Aortic valve regurgitation is   not visualized. Aortic valve sclerosis is present, with no evidence of  aortic valve stenosis. __________  2D echo 08/10/2021: 1. Left ventricular ejection fraction, by estimation, is 60 to 65%. The  left ventricle has normal function. The left ventricle has no regional  wall motion abnormalities. Left ventricular diastolic parameters are  consistent with Grade II diastolic  dysfunction (pseudonormalization).   2. Right ventricular systolic function is normal. The right ventricular  size is mildly enlarged. There is mildly elevated pulmonary artery  systolic pressure. The estimated right ventricular systolic pressure is  97.0 mmHg.   3. Left atrial size was mildly dilated.   4. Right atrial size was mildly dilated.   5. The mitral valve is normal in structure. Mild mitral valve  regurgitation.   6. Tricuspid valve regurgitation is moderate.   7. The aortic valve is grossly normal. Aortic valve regurgitation is not  visualized.   8. The inferior vena cava is normal in size with greater than 50%  respiratory variability, suggesting right atrial pressure of 3 mmHg. __________  LHC 03/25/2016: Prox RCA lesion, 80 %stenosed. A STENT PROMUS PREM MR 3.5X16 (3.8-4.1 mm) drug eluting stent was successfully placed. Post intervention, there is a 0% residual stenosis. Ost RPDA lesion, 90 %stenosed. A STENT PROMUS PREM MR 2.5X12 (2.7 mm) drug eluting stent was successfully placed. Post intervention, there is a 0% residual stenosis. __________________________________ Prox RCA to Mid RCA lesion, 50 %stenosed. Dist RCA lesion, 30 %stenosed.- Appears smooth and chronic Dist LAD lesion, 25 %stenosed - diffuse disease in the distal LAD. Nonsignificant The left ventricular systolic function is normal. The left ventricular ejection fraction is 55-65% by visual estimate. There is no mitral valve regurgitation. There is no aortic valve stenosis.   Thankfully, the patient presented chest pain-free  indicating likely resolution of 100% occluded vessel. This was found to be the case with a thrombotic 80% proximal RCA as well  as a very focal 90% ostial PDA lesion. Both these were treated with DES stents as non-Primary PCI. The digital mid RCA 50% stenosis was relatively smooth and appears to be chronic. This will be treated medically.   Preserved EF with mildly elevated LVEDP. Severe systemic hypertension noted.   Plan: Admit to TCU, step down cardiac unit for post MI/PCI care. TR band removal per protocol Continue Aggrastat infusion until current bag complete Aspirin plus Brilinta for minimum 3 months. At which time could consider stopping aspirin. Restart home dose of carvedilol and losartan.  As she is on furosemide, would hold HCTZ. Increase home Crestor dose to 40 mg. ___________  Carlton Adam MPI 08/13/2015: There was no ST segment deviation noted during stress.   Pharmacological myocardial perfusion imaging study with no significant  ischemia Normal wall motion, EF estimated at 66% No EKG changes concerning for ischemia at peak stress or in recovery. Low risk scan __________  2D echo 08/22/2013: - Left ventricle: The cavity size was normal. Systolic function was    normal. The estimated ejection fraction was in the range of 55%    to 65%. Wall motion was normal; there were no regional wall    motion abnormalities. Doppler parameters are consistent with    abnormal left ventricular relaxation (grade 1 diastolic    dysfunction).  - Mitral valve: There was mild regurgitation.  - Left atrium: The atrium was normal in size.  - Right ventricle: Systolic function was normal.  - Pulmonary arteries: Systolic pressure was within the high normal    range. PA peak pressure: 32 mm Hg (S). __________  See Epic for remaining cardiac studies   EKG:  EKG is ordered today.  The EKG ordered today demonstrates NSR, 72 bpm, first-degree AV block, LBBB (known)  Recent Labs: 11/24/2021: B  Natriuretic Peptide 368.4; TSH 1.482 12/11/2021: ALT 36 12/19/2021: Magnesium 2.0 01/18/2022: BUN 58; Creatinine, Ser 3.12; Hemoglobin 8.7; Platelets 87; Potassium 3.2; Sodium 135  Recent Lipid Panel    Component Value Date/Time   CHOL 129 01/20/2021 0920   TRIG 80.0 01/20/2021 0920   HDL 50.90 01/20/2021 0920   CHOLHDL 3 01/20/2021 0920   VLDL 16.0 01/20/2021 0920   LDLCALC 62 01/20/2021 0920    PHYSICAL EXAM:    VS:  BP (!) 100/50 (BP Location: Left Arm, Patient Position: Sitting, Cuff Size: Normal)   Pulse 72   Ht '4\' 9"'$  (1.448 m)   Wt 172 lb (78 kg)   LMP 05/01/1972   SpO2 95%   BMI 37.22 kg/m   BMI: Body mass index is 37.22 kg/m.  Physical Exam Vitals reviewed.  Constitutional:      Appearance: She is well-developed.  HENT:     Head: Normocephalic and atraumatic.  Eyes:     General:        Right eye: No discharge.        Left eye: No discharge.  Neck:     Vascular: No JVD.  Cardiovascular:     Rate and Rhythm: Normal rate and regular rhythm.     Heart sounds: S1 normal and S2 normal. Heart sounds not distant. No midsystolic click and no opening snap. Murmur heard.     High-pitched blowing holosystolic murmur is present with a grade of 1/6 at the apex.     No friction rub.  Pulmonary:     Effort: Pulmonary effort is normal. No respiratory distress.     Breath sounds: Examination of the right-lower field reveals decreased  breath sounds. Examination of the left-lower field reveals decreased breath sounds. Decreased breath sounds present. No wheezing or rales.  Chest:     Chest wall: No tenderness.  Abdominal:     General: There is no distension.  Musculoskeletal:     Cervical back: Normal range of motion.     Right lower leg: Edema present.     Left lower leg: Edema present.     Comments: Chronic woody bilateral lower extremity edema  Skin:    General: Skin is warm and dry.     Nails: There is no clubbing.  Neurological:     Mental Status: She is alert and  oriented to person, place, and time.  Psychiatric:        Speech: Speech normal.        Behavior: Behavior normal.        Thought Content: Thought content normal.        Judgment: Judgment normal.     Wt Readings from Last 3 Encounters:  01/20/22 172 lb (78 kg)  01/18/22 175 lb 4 oz (79.5 kg)  01/13/22 171 lb 8.3 oz (77.8 kg)     ASSESSMENT & PLAN:   CAD involving the native coronary arteries without angina: She is doing well and is without symptoms of angina.  No longer on aspirin secondary to recurrent GI bleed with symptomatic anemia requiring intermittent PRBC transfusion.  Continue aggressive risk factor modification.  She is not an invasive ischemic evaluation candidate due to her recurrent GI bleed with symptomatic anemia, fortunately no indication for further ischemic testing at this time.  PAF: Maintaining sinus rhythm.  No longer on [place] due to recurrent GI bleed with symptomatic anemia requiring recurrent PRBC transfusion.  She has been evaluated by EP and not felt to be a candidate for [place] as if she cannot tolerate even short-term anticoagulation.  She remains on amiodarone with recent TSH and ALT normal.  With her underlying liver dysfunction, may need to transition off amiodarone down the road.  HFpEF/pulmonary hypertension: She does continue to appear volume up.  Volume managed by hemodialysis and torsemide on nondialysis days.  Recurrent GI bleed with symptomatic anemia: Prior GI evaluation through multiple institutions has been unrevealing.  No longer, and unable to pursue further, anticoagulation in this setting.  Most recent hemoglobin stable to improved 2 days prior.  Follow-up with PCP as directed.  Recent PE: Not a candidate for anticoagulation or thrombectomy secondary to recurrent GI bleed with symptomatic anemia.  Discussed with DOD.  ESRD: Dialysis TTS.  Per nephrology.  HTN: Blood pressure is well controlled in the office today.  HLD: LDL 62 in 12/2020.   Not currently on statin.   Disposition: F/u with Dr. Rockey Situ or an APP in 3 months.   Medication Adjustments/Labs and Tests Ordered: Current medicines are reviewed at length with the patient today.  Concerns regarding medicines are outlined above. Medication changes, Labs and Tests ordered today are summarized above and listed in the Patient Instructions accessible in Encounters.   Signed, Christell Faith, PA-C 01/20/2022 4:38 PM     [facility] [address] [place] [address] [place], [place] [address] [phone]

## 2022-01-19 DIAGNOSIS — D509 Iron deficiency anemia, unspecified: Secondary | ICD-10-CM | POA: Diagnosis not present

## 2022-01-19 DIAGNOSIS — N179 Acute kidney failure, unspecified: Secondary | ICD-10-CM | POA: Diagnosis not present

## 2022-01-19 DIAGNOSIS — N250 Renal osteodystrophy: Secondary | ICD-10-CM | POA: Diagnosis not present

## 2022-01-19 DIAGNOSIS — N189 Chronic kidney disease, unspecified: Secondary | ICD-10-CM | POA: Diagnosis not present

## 2022-01-20 ENCOUNTER — Ambulatory Visit: Payer: Medicare Other | Attending: Physician Assistant | Admitting: Physician Assistant

## 2022-01-20 ENCOUNTER — Encounter: Payer: Self-pay | Admitting: Physician Assistant

## 2022-01-20 VITALS — BP 100/50 | HR 72 | Ht <= 58 in | Wt 172.0 lb

## 2022-01-20 DIAGNOSIS — Z9861 Coronary angioplasty status: Secondary | ICD-10-CM

## 2022-01-20 DIAGNOSIS — Z992 Dependence on renal dialysis: Secondary | ICD-10-CM | POA: Insufficient documentation

## 2022-01-20 DIAGNOSIS — I480 Paroxysmal atrial fibrillation: Secondary | ICD-10-CM | POA: Insufficient documentation

## 2022-01-20 DIAGNOSIS — I5032 Chronic diastolic (congestive) heart failure: Secondary | ICD-10-CM | POA: Diagnosis not present

## 2022-01-20 DIAGNOSIS — D649 Anemia, unspecified: Secondary | ICD-10-CM | POA: Diagnosis not present

## 2022-01-20 DIAGNOSIS — I272 Pulmonary hypertension, unspecified: Secondary | ICD-10-CM | POA: Insufficient documentation

## 2022-01-20 DIAGNOSIS — N186 End stage renal disease: Secondary | ICD-10-CM

## 2022-01-20 DIAGNOSIS — I10 Essential (primary) hypertension: Secondary | ICD-10-CM | POA: Insufficient documentation

## 2022-01-20 DIAGNOSIS — K922 Gastrointestinal hemorrhage, unspecified: Secondary | ICD-10-CM | POA: Diagnosis not present

## 2022-01-20 DIAGNOSIS — I2699 Other pulmonary embolism without acute cor pulmonale: Secondary | ICD-10-CM | POA: Diagnosis not present

## 2022-01-20 DIAGNOSIS — E785 Hyperlipidemia, unspecified: Secondary | ICD-10-CM

## 2022-01-20 DIAGNOSIS — I2510 Atherosclerotic heart disease of native coronary artery without angina pectoris: Secondary | ICD-10-CM | POA: Insufficient documentation

## 2022-01-20 NOTE — Patient Instructions (Signed)
Medication Instructions:  Your physician recommends that you continue on your current medications as directed. Please refer to the Current Medication list given to you today.  *If you need a refill on your cardiac medications before your next appointment, please call your pharmacy*   Lab Work: None  If you have labs (blood work) drawn today and your tests are completely normal, you will receive your results only by: [place] (if you have MyChart) OR A paper copy in the mail If you have any lab test that is abnormal or we need to change your treatment, we will call you to review the results.   Testing/Procedures: None   Follow-Up: At [facility], you and your health needs are our priority.  As part of our continuing mission to provide you with exceptional heart care, we have created designated Provider Care Teams.  These Care Teams include your primary Cardiologist (physician) and Advanced Practice Providers (APPs -  Physician Assistants and Nurse Practitioners) who all work together to provide you with the care you need, when you need it.   Your next appointment:   3 month(s)  The format for your next appointment:   In Person  Provider:   Ida Rogue, MD or Christell Faith, PA-C     Important Information About Sugar

## 2022-01-24 DIAGNOSIS — N189 Chronic kidney disease, unspecified: Secondary | ICD-10-CM | POA: Diagnosis not present

## 2022-01-24 DIAGNOSIS — N250 Renal osteodystrophy: Secondary | ICD-10-CM | POA: Diagnosis not present

## 2022-01-24 DIAGNOSIS — N179 Acute kidney failure, unspecified: Secondary | ICD-10-CM | POA: Diagnosis not present

## 2022-01-24 DIAGNOSIS — D509 Iron deficiency anemia, unspecified: Secondary | ICD-10-CM | POA: Diagnosis not present

## 2022-01-25 ENCOUNTER — Non-Acute Institutional Stay: Payer: Medicare Other | Admitting: Student

## 2022-01-25 DIAGNOSIS — R188 Other ascites: Secondary | ICD-10-CM | POA: Diagnosis not present

## 2022-01-25 DIAGNOSIS — R531 Weakness: Secondary | ICD-10-CM

## 2022-01-25 DIAGNOSIS — Z992 Dependence on renal dialysis: Secondary | ICD-10-CM | POA: Diagnosis not present

## 2022-01-25 DIAGNOSIS — N186 End stage renal disease: Secondary | ICD-10-CM

## 2022-01-25 DIAGNOSIS — I5032 Chronic diastolic (congestive) heart failure: Secondary | ICD-10-CM

## 2022-01-25 DIAGNOSIS — I2699 Other pulmonary embolism without acute cor pulmonale: Secondary | ICD-10-CM | POA: Diagnosis not present

## 2022-01-25 DIAGNOSIS — K7460 Unspecified cirrhosis of liver: Secondary | ICD-10-CM

## 2022-01-25 DIAGNOSIS — Z515 Encounter for palliative care: Secondary | ICD-10-CM

## 2022-01-25 NOTE — Progress Notes (Signed)
[facility] Consult Note Telephone: [phone]  Fax: [phone]    Date of encounter: 01/25/22  PATIENT NAME: April [name] [address] [place] [address]   [phone] (home)  DOB: 1940/05/12 MRN: [id number] PRIMARY CARE PROVIDER:    Einar Pheasant, MD,  [address] [place] [address] [phone]  REFERRING PROVIDER:   Einar Pheasant, [address] [place],  [place] [address] [phone]  RESPONSIBLE PARTY:    Contact Information     Name Relation Home Work Haysi Son  [phone]   Harumi, Yamin [phone]         I met face to face with patient and family in the facility. Palliative Care was asked to follow this patient by consultation request of  Einar Pheasant, MD to address advance care planning and complex medical decision making. This is a follow up visit.                                   ASSESSMENT AND PLAN / RECOMMENDATIONS:   Advance Care Planning/Goals of Care: Goals include to maximize quality of life and symptom management. Patient/health care surrogate gave his/her permission to discuss. Our advance care planning conversation included a discussion about:    The value and importance of advance care planning  Experiences with loved ones who have been seriously ill or have died  Exploration of personal, cultural or spiritual beliefs that might influence medical decisions  Exploration of goals of care in the event of a sudden injury or illness  CODE STATUS: DNR  Education provided on Palliative Medicine. Patient will be completing therapy next week. She would like to return home with 24/7 care. Patient to continue hemodialysis. Reviewed code status; continue DNR. Patient daughter in law present.   Symptom Management/Plan:  Diastolic congestive heart failure-patient endorses shortness of  breath with exertion; no worsening of breathing. She continues to have LE edema, although improved. Elevate legs when in bed or chair, compression stockings recommended. Continue torsemide 40 mg on MWF. Continue daily weights. Follow up with HF clinic as needed.   ESRD-continue hemodialysis on Tuesday/Thursday/Saturday. She states she is tolerating HD well. Follow up with nephrology as scheduled.   Generalized weakness-patient to continue therapy as directed; anticipated to end next week. Ambulate with assistance x 1, use walker for ambulation. Monitor for falls/safety.   Cirrhosis of liver-patient to continue Xifaxan and lactulose as directed.   Acute PE-patient hospitalized 9/11-9/15/23 due to nearly occlusive PE. She was started on heparin drip but stopped due to anemia. It was decided against anticoagulation given her high risk for bleeding.   Follow up Palliative Care Visit: Palliative care will continue to follow for complex medical decision making, advance care planning, and clarification of goals. Return in 4 weeks or prn.  This visit was coded based on medical decision making (MDM).  PPS: 40%  HOSPICE ELIGIBILITY/DIAGNOSIS: TBD  Chief Complaint: Palliative Medicine follow up visit.   HISTORY OF PRESENT ILLNESS:  CARLICIA LEAVENS is a 81 y.o. year old female  with diastolic CHF, atrial fibrillation, COPD, CAD, OSA on CPAP, diabetes, hypertension, hyperlipidemia, thrombocytopenia. Most recent hospitalization 9/11-9/15/23 due to acute PE, COPD exacerbation, acute hypoxic respiratory failure, anemia.   Patient sitting up to recliner; daughter in law Lauren at bedside. Patient reports doing well. She states she is tolerating HD; she  reports sleeping upon returning to facility on HD days. She states her breathing has been stable; endorses shortness of breath with exertion. Continue to have LE edema; has improved some. Worsens throughout the day. She is receiving therapy; anticipated to end  next week. DIL is working on arranging 24/7 care for patient to return home.   History obtained from review of EMR, discussion with primary team, and interview with family, facility staff/caregiver and/or [name].  I reviewed available labs, medications, imaging, studies and related documents from the EMR.  Records reviewed and summarized above.   ROS  A 10-Point ROS is negative, except for the pertinent positives and negatives detailed per the HPI.   Physical Exam: Constitutional: NAD General: frail appearing, obese  EYES: anicteric sclera, lids intact, no discharge  ENMT: intact hearing, oral mucous membranes moist, dentition intact CV: S1S2, RRR, 2+ LE edema Pulmonary: LCTA, no increased work of breathing, no cough, room air Abdomen: normo-active BS + 4 quadrants, firm and non tender GU: deferred MSK: moves all extremities, ambulatory Skin: warm and dry, no rashes or wounds on visible skin Neuro: + generalized weakness, A & O x 3 Psych: non-anxious affect, pleasant Hem/lymph/immuno: no widespread bruising   Thank you for the opportunity to participate in the care of [name].  The palliative care team will continue to follow. Please call our office at [phone] if we can be of additional assistance.   Ezekiel Slocumb, NP   COVID-19 PATIENT SCREENING TOOL Asked and negative response unless otherwise noted:   Have you had symptoms of covid, tested positive or been in contact with someone with symptoms/positive test in the past 5-10 days? No

## 2022-01-26 DIAGNOSIS — N250 Renal osteodystrophy: Secondary | ICD-10-CM | POA: Diagnosis not present

## 2022-01-26 DIAGNOSIS — D509 Iron deficiency anemia, unspecified: Secondary | ICD-10-CM | POA: Diagnosis not present

## 2022-01-26 DIAGNOSIS — N179 Acute kidney failure, unspecified: Secondary | ICD-10-CM | POA: Diagnosis not present

## 2022-01-26 DIAGNOSIS — N189 Chronic kidney disease, unspecified: Secondary | ICD-10-CM | POA: Diagnosis not present

## 2022-01-27 DIAGNOSIS — K7460 Unspecified cirrhosis of liver: Secondary | ICD-10-CM | POA: Diagnosis not present

## 2022-01-27 DIAGNOSIS — N186 End stage renal disease: Secondary | ICD-10-CM | POA: Diagnosis not present

## 2022-01-27 DIAGNOSIS — Z992 Dependence on renal dialysis: Secondary | ICD-10-CM | POA: Diagnosis not present

## 2022-01-27 DIAGNOSIS — R188 Other ascites: Secondary | ICD-10-CM | POA: Diagnosis not present

## 2022-01-27 DIAGNOSIS — D631 Anemia in chronic kidney disease: Secondary | ICD-10-CM | POA: Diagnosis not present

## 2022-01-29 DIAGNOSIS — K5730 Diverticulosis of large intestine without perforation or abscess without bleeding: Secondary | ICD-10-CM | POA: Diagnosis not present

## 2022-01-29 DIAGNOSIS — D696 Thrombocytopenia, unspecified: Secondary | ICD-10-CM | POA: Diagnosis present

## 2022-01-29 DIAGNOSIS — R1110 Vomiting, unspecified: Secondary | ICD-10-CM | POA: Diagnosis not present

## 2022-01-29 DIAGNOSIS — R531 Weakness: Secondary | ICD-10-CM | POA: Diagnosis not present

## 2022-01-29 DIAGNOSIS — K766 Portal hypertension: Secondary | ICD-10-CM | POA: Diagnosis present

## 2022-01-29 DIAGNOSIS — Z992 Dependence on renal dialysis: Secondary | ICD-10-CM | POA: Diagnosis not present

## 2022-01-29 DIAGNOSIS — A419 Sepsis, unspecified organism: Secondary | ICD-10-CM | POA: Diagnosis not present

## 2022-01-29 DIAGNOSIS — K7682 Hepatic encephalopathy: Secondary | ICD-10-CM | POA: Diagnosis not present

## 2022-01-29 DIAGNOSIS — N179 Acute kidney failure, unspecified: Secondary | ICD-10-CM | POA: Diagnosis not present

## 2022-01-29 DIAGNOSIS — K5521 Angiodysplasia of colon with hemorrhage: Secondary | ICD-10-CM | POA: Diagnosis present

## 2022-01-29 DIAGNOSIS — I081 Rheumatic disorders of both mitral and tricuspid valves: Secondary | ICD-10-CM | POA: Diagnosis present

## 2022-01-29 DIAGNOSIS — I132 Hypertensive heart and chronic kidney disease with heart failure and with stage 5 chronic kidney disease, or end stage renal disease: Secondary | ICD-10-CM | POA: Diagnosis present

## 2022-01-29 DIAGNOSIS — J9811 Atelectasis: Secondary | ICD-10-CM | POA: Diagnosis present

## 2022-01-29 DIAGNOSIS — D62 Acute posthemorrhagic anemia: Secondary | ICD-10-CM | POA: Diagnosis present

## 2022-01-29 DIAGNOSIS — I5033 Acute on chronic diastolic (congestive) heart failure: Secondary | ICD-10-CM | POA: Diagnosis present

## 2022-01-29 DIAGNOSIS — I25110 Atherosclerotic heart disease of native coronary artery with unstable angina pectoris: Secondary | ICD-10-CM | POA: Diagnosis not present

## 2022-01-29 DIAGNOSIS — E872 Acidosis, unspecified: Secondary | ICD-10-CM | POA: Diagnosis present

## 2022-01-29 DIAGNOSIS — E8770 Fluid overload, unspecified: Secondary | ICD-10-CM | POA: Diagnosis not present

## 2022-01-29 DIAGNOSIS — R579 Shock, unspecified: Secondary | ICD-10-CM | POA: Diagnosis present

## 2022-01-29 DIAGNOSIS — K921 Melena: Secondary | ICD-10-CM | POA: Diagnosis not present

## 2022-01-29 DIAGNOSIS — I2693 Single subsegmental pulmonary embolism without acute cor pulmonale: Secondary | ICD-10-CM | POA: Diagnosis not present

## 2022-01-29 DIAGNOSIS — D500 Iron deficiency anemia secondary to blood loss (chronic): Secondary | ICD-10-CM | POA: Diagnosis not present

## 2022-01-29 DIAGNOSIS — Z9989 Dependence on other enabling machines and devices: Secondary | ICD-10-CM | POA: Diagnosis not present

## 2022-01-29 DIAGNOSIS — I272 Pulmonary hypertension, unspecified: Secondary | ICD-10-CM | POA: Diagnosis present

## 2022-01-29 DIAGNOSIS — Z1152 Encounter for screening for COVID-19: Secondary | ICD-10-CM | POA: Diagnosis not present

## 2022-01-29 DIAGNOSIS — D649 Anemia, unspecified: Secondary | ICD-10-CM | POA: Diagnosis not present

## 2022-01-29 DIAGNOSIS — K219 Gastro-esophageal reflux disease without esophagitis: Secondary | ICD-10-CM | POA: Diagnosis not present

## 2022-01-29 DIAGNOSIS — N186 End stage renal disease: Secondary | ICD-10-CM | POA: Diagnosis present

## 2022-01-29 DIAGNOSIS — I5032 Chronic diastolic (congestive) heart failure: Secondary | ICD-10-CM | POA: Diagnosis not present

## 2022-01-29 DIAGNOSIS — E871 Hypo-osmolality and hyponatremia: Secondary | ICD-10-CM | POA: Diagnosis present

## 2022-01-29 DIAGNOSIS — J3800 Paralysis of vocal cords and larynx, unspecified: Secondary | ICD-10-CM | POA: Diagnosis not present

## 2022-01-29 DIAGNOSIS — N2581 Secondary hyperparathyroidism of renal origin: Secondary | ICD-10-CM | POA: Diagnosis present

## 2022-01-29 DIAGNOSIS — K7460 Unspecified cirrhosis of liver: Secondary | ICD-10-CM | POA: Diagnosis present

## 2022-01-29 DIAGNOSIS — R6520 Severe sepsis without septic shock: Secondary | ICD-10-CM | POA: Diagnosis not present

## 2022-01-29 DIAGNOSIS — I959 Hypotension, unspecified: Secondary | ICD-10-CM | POA: Diagnosis not present

## 2022-01-29 DIAGNOSIS — K922 Gastrointestinal hemorrhage, unspecified: Secondary | ICD-10-CM | POA: Diagnosis not present

## 2022-01-29 DIAGNOSIS — D509 Iron deficiency anemia, unspecified: Secondary | ICD-10-CM | POA: Diagnosis not present

## 2022-01-29 DIAGNOSIS — M6281 Muscle weakness (generalized): Secondary | ICD-10-CM | POA: Diagnosis not present

## 2022-01-29 DIAGNOSIS — J449 Chronic obstructive pulmonary disease, unspecified: Secondary | ICD-10-CM | POA: Diagnosis not present

## 2022-01-29 DIAGNOSIS — R188 Other ascites: Secondary | ICD-10-CM | POA: Diagnosis present

## 2022-01-29 DIAGNOSIS — G4733 Obstructive sleep apnea (adult) (pediatric): Secondary | ICD-10-CM | POA: Diagnosis not present

## 2022-01-29 DIAGNOSIS — I10 Essential (primary) hypertension: Secondary | ICD-10-CM | POA: Diagnosis not present

## 2022-01-29 DIAGNOSIS — R601 Generalized edema: Secondary | ICD-10-CM | POA: Diagnosis not present

## 2022-01-29 DIAGNOSIS — K449 Diaphragmatic hernia without obstruction or gangrene: Secondary | ICD-10-CM | POA: Diagnosis not present

## 2022-01-29 DIAGNOSIS — I482 Chronic atrial fibrillation, unspecified: Secondary | ICD-10-CM | POA: Diagnosis not present

## 2022-01-29 DIAGNOSIS — D631 Anemia in chronic kidney disease: Secondary | ICD-10-CM | POA: Diagnosis present

## 2022-01-29 DIAGNOSIS — I700 Atherosclerosis of aorta: Secondary | ICD-10-CM | POA: Diagnosis present

## 2022-01-29 DIAGNOSIS — Z4901 Encounter for fitting and adjustment of extracorporeal dialysis catheter: Secondary | ICD-10-CM | POA: Diagnosis not present

## 2022-01-29 DIAGNOSIS — Z66 Do not resuscitate: Secondary | ICD-10-CM | POA: Diagnosis present

## 2022-01-29 DIAGNOSIS — I480 Paroxysmal atrial fibrillation: Secondary | ICD-10-CM | POA: Diagnosis not present

## 2022-01-29 DIAGNOSIS — R2681 Unsteadiness on feet: Secondary | ICD-10-CM | POA: Diagnosis not present

## 2022-01-29 DIAGNOSIS — E1122 Type 2 diabetes mellitus with diabetic chronic kidney disease: Secondary | ICD-10-CM | POA: Diagnosis present

## 2022-01-29 DIAGNOSIS — N250 Renal osteodystrophy: Secondary | ICD-10-CM | POA: Diagnosis not present

## 2022-01-29 DIAGNOSIS — Z515 Encounter for palliative care: Secondary | ICD-10-CM | POA: Diagnosis not present

## 2022-01-29 DIAGNOSIS — K31811 Angiodysplasia of stomach and duodenum with bleeding: Secondary | ICD-10-CM | POA: Diagnosis present

## 2022-01-29 DIAGNOSIS — N189 Chronic kidney disease, unspecified: Secondary | ICD-10-CM | POA: Diagnosis not present

## 2022-01-29 DIAGNOSIS — R2689 Other abnormalities of gait and mobility: Secondary | ICD-10-CM | POA: Diagnosis not present

## 2022-01-30 ENCOUNTER — Ambulatory Visit (INDEPENDENT_AMBULATORY_CARE_PROVIDER_SITE_OTHER): Payer: Medicare Other | Admitting: Internal Medicine

## 2022-01-30 DIAGNOSIS — I700 Atherosclerosis of aorta: Secondary | ICD-10-CM | POA: Diagnosis not present

## 2022-01-30 DIAGNOSIS — G4733 Obstructive sleep apnea (adult) (pediatric): Secondary | ICD-10-CM

## 2022-01-30 DIAGNOSIS — E1122 Type 2 diabetes mellitus with diabetic chronic kidney disease: Secondary | ICD-10-CM | POA: Diagnosis not present

## 2022-01-30 DIAGNOSIS — I2510 Atherosclerotic heart disease of native coronary artery without angina pectoris: Secondary | ICD-10-CM

## 2022-01-30 DIAGNOSIS — K219 Gastro-esophageal reflux disease without esophagitis: Secondary | ICD-10-CM | POA: Diagnosis not present

## 2022-01-30 DIAGNOSIS — J449 Chronic obstructive pulmonary disease, unspecified: Secondary | ICD-10-CM | POA: Diagnosis not present

## 2022-01-30 DIAGNOSIS — K7460 Unspecified cirrhosis of liver: Secondary | ICD-10-CM | POA: Diagnosis not present

## 2022-01-30 DIAGNOSIS — N186 End stage renal disease: Secondary | ICD-10-CM | POA: Diagnosis not present

## 2022-01-30 DIAGNOSIS — I5032 Chronic diastolic (congestive) heart failure: Secondary | ICD-10-CM

## 2022-01-30 DIAGNOSIS — Z992 Dependence on renal dialysis: Secondary | ICD-10-CM

## 2022-01-30 DIAGNOSIS — R188 Other ascites: Secondary | ICD-10-CM

## 2022-01-30 DIAGNOSIS — Z9861 Coronary angioplasty status: Secondary | ICD-10-CM

## 2022-01-30 DIAGNOSIS — D631 Anemia in chronic kidney disease: Secondary | ICD-10-CM

## 2022-01-30 DIAGNOSIS — K922 Gastrointestinal hemorrhage, unspecified: Secondary | ICD-10-CM | POA: Diagnosis not present

## 2022-01-30 DIAGNOSIS — I10 Essential (primary) hypertension: Secondary | ICD-10-CM | POA: Diagnosis not present

## 2022-01-30 DIAGNOSIS — I480 Paroxysmal atrial fibrillation: Secondary | ICD-10-CM

## 2022-01-30 DIAGNOSIS — I25110 Atherosclerotic heart disease of native coronary artery with unstable angina pectoris: Secondary | ICD-10-CM

## 2022-01-30 DIAGNOSIS — E782 Mixed hyperlipidemia: Secondary | ICD-10-CM

## 2022-01-30 DIAGNOSIS — D6859 Other primary thrombophilia: Secondary | ICD-10-CM

## 2022-01-30 LAB — CBC AND DIFFERENTIAL
HCT: 23 — AB (ref 36–46)
Hemoglobin: 7.4 — AB (ref 12.0–16.0)
Neutrophils Absolute: 6.2
Platelets: 102 10*3/uL — AB (ref 150–400)
WBC: 9.5

## 2022-01-30 LAB — CBC: RBC: 2.36 — AB (ref 3.87–5.11)

## 2022-01-30 NOTE — Progress Notes (Unsigned)
Patient ID: TEA COLLUMS, female   DOB: [birth year], 81 y.o.   MRN: [id number]   Subjective:    Patient ID: April [name], female    DOB: 1940/11/04, 81 y.o.   MRN: [id number]   Patient here for  Chief Complaint  Patient presents with   [facility] follow up   .   HPI Admitted 01/09/22 - 01/13/22 - presented with sob and cough. W/up there revealed VQ - positive for PE.  CT angiogram showed near occlusive pulmonary embolism in lingular pulmonary artery. Started on heparin drip, but this had to be stopped due to anemia.  Felt not to be a candidate for anticoagulation.  Treated for copd exacerbation with abx and steroid.  Palliative care consulted.  Discharged to SNF. While in hospital, hgb dropped to 6.  Transfused one unit of prbc. Has been evaluated by GI and has had extensive evaluation - including endoscopy, colonoscopy and capsule endoscopy without finding of any source of bleeding.  Is currently on lactulose.  (Ammonia level was elevated during admission). Has ESRD - dialysis q TTS.  Missed Saturday dialysis - due to nausea/vomiting.  Taking torsemide.  Breathing overall stable.  No nausea or vomiting currently.  Does report some persistent blood per rectum.  Feels is related to hemorrhoidal bleeding.  Intermittent.     Past Medical History:  Diagnosis Date   Anemia    CAD S/P PCI pRCA Promus DES 3.5 x 16 (4.1 mm), ostRPDA Promus DES 2.5 x 12 (2.7 mm) 03/25/2016   CAD S/P percutaneous coronary angioplasty    a. 07/2015 MV: No ischemia, EF 66%;  b. 03/2016 Inflat STEMI/PCI: LM nl, LAD 25d, RI nl, LCX nl, OM1/2 nl, RCA 80p (3.5x16 Promus Premier DES), 50p/m, 30d, RPDA 90 (2.5x12 Promu Premier DES); c. 01/2018 Cath ([place], [place]): Patent RCA stents->Med Rx.   CHF (congestive heart failure) ([place])    CKD (chronic kidney disease), stage III ([place])    Diabetes mellitus without complication ([place])    Diastolic dysfunction    a. 10/2013 Echo: EF 55-65%, Gr1 DD; b. 01/2018  Echo ([place], [place]): EF 55-60%, Gr2 DD, RVSP 79mHg.   Diverticulitis    Dyspnea    Endometriosis    Family history of adverse reaction to anesthesia    Mother - had to stay in ICU because of breathing difficulties every time she had anesthesia   GERD (gastroesophageal reflux disease)    GI bleed    a. 06/2021 - melena-->2 u PRBCs.   Hiatal hernia    History of colon polyps 08/1994   History of Migraines    Hypercholesterolemia    Hypertension    LBBB (left bundle branch block)    Osteoporosis    PAF (paroxysmal atrial fibrillation) ([place]    a. 03/2016 Dx @ time of MI; b. CHA2DS2VASc = 5-->Eliquis.   Rheumatic fever    Sleep apnea    CPAP   Spasmodic dysphonia    ST elevation myocardial infarction (STEMI) of inferolateral wall, initial episode of care ([place] 03/25/2016   ST elevation myocardial infarction (STEMI) of inferolateral wall, initial episode of care ([facility] 03/25/2016   Urine incontinence    Past Surgical History:  Procedure Laterality Date   ABDOMINAL HYSTERECTOMY     ovaries not removed   APPENDECTOMY     was removed during hysterectomy   Breast biopsies     x2   BREAST EXCISIONAL BIOPSY Bilateral "years ago"  neg   BREAST SURGERY Bilateral    BROW LIFT Bilateral 08/15/2019   Procedure: BLEPHAROPLASTY UPPER EYELID; W/EXCESS SKIN BLEPHAROPTOSIS REPAIR; RESECT EX;  Surgeon: Karle Starch, MD;  Location: [place];  Service: Ophthalmology;  Laterality: Bilateral;  sleep apnea   CARDIAC CATHETERIZATION  2011   moderate 40% RCA disease   CARDIAC CATHETERIZATION  01/2010   Dr. Gollan@[facility] : Only noted 40% RCA   CARDIAC CATHETERIZATION N/A 03/25/2016   Procedure: Left Heart Cath and Coronary Angiography;  Surgeon: 04/07/2016, MD;  Location: [place] CV LAB;  Service: Cardiovascular;  Laterality: N/A;   CARDIAC CATHETERIZATION N/A 03/25/2016   Procedure: Coronary Stent Intervention;  Surgeon: 04/07/2016, MD;  Location: [place] CV  LAB;  Service: Cardiovascular;  Laterality: N/A;   COLONOSCOPY  2013   COLONOSCOPY WITH PROPOFOL N/A 07/11/2021   Procedure: COLONOSCOPY WITH PROPOFOL;  Surgeon: 07/24/2021, MD;  Location: [facility] ENDOSCOPY;  Service: Endoscopy;  Laterality: N/A;   DIALYSIS/PERMA CATHETER INSERTION N/A 12/15/2021   Procedure: DIALYSIS/PERMA CATHETER INSERTION;  Surgeon: 12/28/2021, MD;  Location: [place] CV LAB;  Service: Cardiovascular;  Laterality: N/A;   ESOPHAGOGASTRODUODENOSCOPY (EGD) WITH PROPOFOL N/A 03/17/2015   Procedure: ESOPHAGOGASTRODUODENOSCOPY (EGD) WITH PROPOFOL;  Surgeon: 03/30/2015, MD;  Location: [facility] ENDOSCOPY;  Service: Gastroenterology;  Laterality: N/A;   ESOPHAGOGASTRODUODENOSCOPY (EGD) WITH PROPOFOL N/A 07/10/2021   Procedure: ESOPHAGOGASTRODUODENOSCOPY (EGD) WITH PROPOFOL;  Surgeon: 16/03/2022, MD;  Location[facility] ENDOSCOPY;  Service: Gastroenterology;  Laterality: N/A;   GIVENS CAPSULE STUDY N/A 07/11/2021   Procedure: GIVENS CAPSULE STUDY;  Surgeon: 07/24/2021, MD;  Location: [facility] ENDOSCOPY;  Service: Endoscopy;  Laterality: N/A;   NM MYOVIEW ([place] [facility])  07/2015   No evidence ischemia or infarction. EF 66%. Low risk   TEMPORARY DIALYSIS CATHETER N/A 12/08/2021   Procedure: TEMPORARY DIALYSIS CATHETER;  Surgeon: 21/01/2022, MD;  Location: [place] CV LAB;  Service: Cardiovascular;  Laterality: N/A;   TONSILLECTOMY     TRANSTHORACIC ECHOCARDIOGRAM  10/2013   Normal LV size and function. EF 55-65%. GR 1 DD. Otherwise normal.   Family History  Problem Relation Age of Onset   Arthritis Mother    Stroke Mother    Hypertension Mother    Osteoporosis Mother    Heart disease Father        MI   Heart attack Father    Stroke Brother    Heart attack Sister    Breast cancer Other        first cousin x 2   Stroke Daughter    Heart attack Daughter    Colon cancer Neg Hx    Social History   Socioeconomic History   Marital status: Widowed    Spouse  name: Not on file   Number of children: 2   Years of education: Not on file   Highest education level: Not on file  Occupational History   Not on file  Tobacco Use   Smoking status: Never   Smokeless tobacco: Never  Vaping Use   Vaping Use: Never used  Substance and Sexual Activity   Alcohol use: No    Alcohol/week: 0.0 standard drinks of alcohol   Drug use: No   Sexual activity: Not Currently    Birth control/protection: Post-menopausal  Other Topics Concern   Not on file  Social History Narrative   Not on file   Social Determinants of Health   Financial Resource Strain: Not on file  Food Insecurity: Not on file  Transportation Needs: Not on file  Physical Activity: Not on file  Stress: Not on file  Social Connections: Not on file     Review of Systems  Constitutional:  Positive for fatigue. Negative for appetite change.  HENT:  Negative for congestion and sinus pressure.   Respiratory:  Negative for cough and chest tightness.        Breathing overall stable.  Chronic sob.   Cardiovascular:  Positive for leg swelling. Negative for chest pain and palpitations.  Gastrointestinal:        No nausea or vomiting currently.  No abdominal pain.  Full feeling.   Genitourinary:  Negative for dysuria and hematuria.  Musculoskeletal:  Negative for joint swelling and myalgias.  Skin:  Negative for color change and rash.  Neurological:  Negative for dizziness and headaches.  Psychiatric/Behavioral:  Negative for agitation and dysphoric mood.        Objective:     BP 118/88   Pulse 90   Temp 97.9 F (36.6 C)   Ht 5' (1.524 m)   Wt 178 lb 12.8 oz (81.1 kg)   LMP 05/01/1972   SpO2 98%   BMI 34.92 kg/m  Wt Readings from Last 3 Encounters:  02/01/22 183 lb 6.8 oz (83.2 kg)  02/02/22 178 lb 12.8 oz (81.1 kg)  01/20/22 172 lb (78 kg)    Physical Exam Vitals reviewed.  Constitutional:      General: She is not in acute distress.    Appearance: Normal appearance.   HENT:     Head: Normocephalic and atraumatic.     Right Ear: External ear normal.     Left Ear: External ear normal.  Eyes:     General: No scleral icterus.       Right eye: No discharge.        Left eye: No discharge.     Conjunctiva/sclera: Conjunctivae normal.  Neck:     Thyroid: No thyromegaly.  Cardiovascular:     Rate and Rhythm: Normal rate and regular rhythm.  Pulmonary:     Breath sounds: Normal breath sounds. No wheezing.  Abdominal:     General: Bowel sounds are normal.     Palpations: Abdomen is soft.     Tenderness: There is no abdominal tenderness.  Musculoskeletal:     Cervical back: Neck supple. No tenderness.     Comments: Increased swelling pedal and lower extremities.    Lymphadenopathy:     Cervical: No cervical adenopathy.  Skin:    Findings: No erythema or rash.  Neurological:     Mental Status: She is alert.  Psychiatric:        Mood and Affect: Mood normal.        Behavior: Behavior normal.      No facility-administered encounter medications on file as of 01/30/2022.   Outpatient Encounter Medications as of 01/30/2022  Medication Sig   acetaminophen (TYLENOL) 325 MG tablet Take 650 mg by mouth every 4 (four) hours as needed for mild pain or fever.   Amino Acids-Protein Hydrolys (FEEDING SUPPLEMENT, PRO-STAT SUGAR FREE 64,) LIQD Take 30 mLs by mouth 3 (three) times daily with meals.   amiodarone (PACERONE) 200 MG tablet Take 1 tablet (200 mg total) by mouth daily.   ipratropium-albuterol (DUONEB) 0.5-2.5 (3) MG/3ML SOLN Take 3 mLs by nebulization every 6 (six) hours as needed.   lactulose (CHRONULAC) 10 GM/15ML solution Take 30 mLs (20 g total) by mouth 2 (two)  times daily.   lidocaine (LIDODERM) 5 % Place 2 patches onto the skin daily. Remove & Discard patch within 12 hours or as directed by MD (Patient taking differently: Place 1 patch onto the skin daily. Remove & Discard patch within 12 hours PRN or as directed by MD)   Multiple  Vitamins-Minerals (MULTIVITAMIN WITH MINERALS) tablet Take 1 tablet by mouth daily.   nitroGLYCERIN (NITROSTAT) 0.4 MG SL tablet Place 1 tablet (0.4 mg total) under the tongue every 5 (five) minutes as needed for chest pain.   rifaximin (XIFAXAN) 200 MG tablet Take 1 tablet (200 mg total) by mouth 2 (two) times daily.   torsemide 40 MG TABS Take 40 mg by mouth 3 (three) times a week.   albuterol (VENTOLIN HFA) 108 (90 Base) MCG/ACT inhaler Inhale 2 puffs into the lungs every 6 (six) hours as needed for wheezing or shortness of breath.   diclofenac Sodium (VOLTAREN) 1 % GEL Apply 2 g topically 3 (three) times daily as needed (pain in legs and elbows).   docusate sodium (STOOL SOFTENER) 100 MG capsule Take 1 capsule (100 mg total) by mouth 2 (two) times daily.   famotidine (PEPCID) 20 MG tablet Take 20 mg by mouth 2 (two) times daily.     Lab Results  Component Value Date   WBC 13.4 (H) 02/02/2022   HGB 6.6 (L) 02/02/2022   HCT 19.9 (L) 02/02/2022   PLT 101 (L) 02/02/2022   GLUCOSE 145 (H) 02/02/2022   CHOL 129 01/20/2021   TRIG 80.0 01/20/2021   HDL 50.90 01/20/2021   LDLCALC 62 01/20/2021   ALT 38 02/02/2022   AST 78 (H) 02/02/2022   NA 133 (L) 02/02/2022   K 3.6 02/02/2022   CL 100 02/02/2022   CREATININE 4.60 (H) 02/02/2022   BUN 54 (H) 02/02/2022   CO2 18 (L) 02/02/2022   TSH 1.482 11/24/2021   INR 1.2 02/01/2022   HGBA1C 6.3 09/21/2021   MICROALBUR <0.7 04/29/2020       Assessment & Plan:   Problem List Items Addressed This Visit     Anemia in ESRD (end-stage renal disease) ([place])    CBC just checked today at SNF.  Obtain results.        Aortic atherosclerosis ([place])    Off crestor due to cirrhosis.  Follow.       Chronic diastolic CHF (congestive heart failure) (H[facility])    Continues dialysis and torsemide.  Missed dialysis Saturday.  Due for dialysis tomorrow.        Chronic obstructive pulmonary disease, unspecified COPD type ([place])    Breathing stable.  Chronic  sob.       Cirrhosis ([place])    Continues on lactulose.        Coronary artery disease involving native coronary artery of native heart with unstable angina pectoris (H[facility])    No aspirin due to GI bleeding.  No statin due to cirrhosis.        Diabetes mellitus ([place])    Low carb diet and exercise.  Follow met b and a1c.       ESRD on dialysis [facility])    Dialysis TTS.  Missed Saturday due to nausea and vomiting.  Due tomorrow.  Taking torsemide.       GERD (gastroesophageal reflux disease)    Continues on protonix.       GI bleeding    Off anticoagulation due to concern regarding GI bleed.  Colonoscopy 06/2021 - tubular adenoma, diverticulosis and non  bleeding internal hemorrhoid.  EGD negative H. Pylori, mildly erythematous gastric mucosa with no obvious bleeding.  Bleeding noted now. Intermittent.  Feels related to hemorrhoids.  Discussed possible banding.  Will contact surgery for possible evaluation - for banding.  Would not tolerate surgery.        HTN (hypertension)    Blood pressure as outlined.  Continues on torsemide. Follow pressures.  Follow metabolic panel. Labs to be checked at dialysis.       Hyperlipidemia    Off crestor due to cirrhosis.        OSA on CPAP    Continue cpap.        Paroxysmal atrial fibrillation (H[facility])    Not on anticoagulation due to anemia and concern for GI bleeding.  On amiodarone.  Follow.        Thrombophilia ([facility])    Afib.  Off eliquis.          Einar Pheasant, MD

## 2022-01-31 DIAGNOSIS — N179 Acute kidney failure, unspecified: Secondary | ICD-10-CM | POA: Diagnosis not present

## 2022-01-31 DIAGNOSIS — N189 Chronic kidney disease, unspecified: Secondary | ICD-10-CM | POA: Diagnosis not present

## 2022-01-31 DIAGNOSIS — N250 Renal osteodystrophy: Secondary | ICD-10-CM | POA: Diagnosis not present

## 2022-01-31 DIAGNOSIS — D509 Iron deficiency anemia, unspecified: Secondary | ICD-10-CM | POA: Diagnosis not present

## 2022-01-31 DIAGNOSIS — D649 Anemia, unspecified: Secondary | ICD-10-CM | POA: Diagnosis not present

## 2022-02-01 ENCOUNTER — Emergency Department: Payer: Medicare Other

## 2022-02-01 ENCOUNTER — Telehealth: Payer: Self-pay

## 2022-02-01 ENCOUNTER — Other Ambulatory Visit: Payer: Self-pay | Admitting: *Deleted

## 2022-02-01 ENCOUNTER — Inpatient Hospital Stay
Admission: EM | Admit: 2022-02-01 | Discharge: 2022-02-12 | DRG: 377 | Disposition: A | Discharge status: Home Health | Payer: Medicare Other | Attending: Hospitalist | Admitting: Hospitalist

## 2022-02-01 ENCOUNTER — Other Ambulatory Visit: Payer: Self-pay

## 2022-02-01 ENCOUNTER — Encounter: Payer: Self-pay | Admitting: Internal Medicine

## 2022-02-01 ENCOUNTER — Encounter: Payer: Self-pay | Admitting: Intensive Care

## 2022-02-01 DIAGNOSIS — Z955 Presence of coronary angioplasty implant and graft: Secondary | ICD-10-CM

## 2022-02-01 DIAGNOSIS — G4733 Obstructive sleep apnea (adult) (pediatric): Secondary | ICD-10-CM | POA: Diagnosis present

## 2022-02-01 DIAGNOSIS — I10 Essential (primary) hypertension: Secondary | ICD-10-CM | POA: Diagnosis present

## 2022-02-01 DIAGNOSIS — I081 Rheumatic disorders of both mitral and tricuspid valves: Secondary | ICD-10-CM | POA: Diagnosis present

## 2022-02-01 DIAGNOSIS — R188 Other ascites: Secondary | ICD-10-CM | POA: Diagnosis present

## 2022-02-01 DIAGNOSIS — Z1152 Encounter for screening for COVID-19: Secondary | ICD-10-CM

## 2022-02-01 DIAGNOSIS — Z86711 Personal history of pulmonary embolism: Secondary | ICD-10-CM

## 2022-02-01 DIAGNOSIS — E8770 Fluid overload, unspecified: Secondary | ICD-10-CM | POA: Diagnosis not present

## 2022-02-01 DIAGNOSIS — R601 Generalized edema: Secondary | ICD-10-CM | POA: Diagnosis not present

## 2022-02-01 DIAGNOSIS — D649 Anemia, unspecified: Secondary | ICD-10-CM

## 2022-02-01 DIAGNOSIS — E872 Acidosis, unspecified: Secondary | ICD-10-CM | POA: Diagnosis present

## 2022-02-01 DIAGNOSIS — R7989 Other specified abnormal findings of blood chemistry: Secondary | ICD-10-CM | POA: Diagnosis present

## 2022-02-01 DIAGNOSIS — K7469 Other cirrhosis of liver: Secondary | ICD-10-CM | POA: Diagnosis not present

## 2022-02-01 DIAGNOSIS — I959 Hypotension, unspecified: Secondary | ICD-10-CM | POA: Diagnosis not present

## 2022-02-01 DIAGNOSIS — Z8249 Family history of ischemic heart disease and other diseases of the circulatory system: Secondary | ICD-10-CM

## 2022-02-01 DIAGNOSIS — K449 Diaphragmatic hernia without obstruction or gangrene: Secondary | ICD-10-CM | POA: Diagnosis not present

## 2022-02-01 DIAGNOSIS — K31811 Angiodysplasia of stomach and duodenum with bleeding: Principal | ICD-10-CM | POA: Diagnosis present

## 2022-02-01 DIAGNOSIS — E1122 Type 2 diabetes mellitus with diabetic chronic kidney disease: Secondary | ICD-10-CM | POA: Diagnosis not present

## 2022-02-01 DIAGNOSIS — K7460 Unspecified cirrhosis of liver: Secondary | ICD-10-CM | POA: Diagnosis present

## 2022-02-01 DIAGNOSIS — D696 Thrombocytopenia, unspecified: Secondary | ICD-10-CM | POA: Diagnosis present

## 2022-02-01 DIAGNOSIS — I132 Hypertensive heart and chronic kidney disease with heart failure and with stage 5 chronic kidney disease, or end stage renal disease: Secondary | ICD-10-CM | POA: Diagnosis present

## 2022-02-01 DIAGNOSIS — K5521 Angiodysplasia of colon with hemorrhage: Secondary | ICD-10-CM | POA: Diagnosis present

## 2022-02-01 DIAGNOSIS — N2581 Secondary hyperparathyroidism of renal origin: Secondary | ICD-10-CM | POA: Diagnosis present

## 2022-02-01 DIAGNOSIS — I480 Paroxysmal atrial fibrillation: Secondary | ICD-10-CM | POA: Diagnosis present

## 2022-02-01 DIAGNOSIS — D62 Acute posthemorrhagic anemia: Secondary | ICD-10-CM | POA: Diagnosis present

## 2022-02-01 DIAGNOSIS — E871 Hypo-osmolality and hyponatremia: Secondary | ICD-10-CM | POA: Diagnosis present

## 2022-02-01 DIAGNOSIS — K921 Melena: Secondary | ICD-10-CM

## 2022-02-01 DIAGNOSIS — K648 Other hemorrhoids: Secondary | ICD-10-CM | POA: Diagnosis present

## 2022-02-01 DIAGNOSIS — I700 Atherosclerosis of aorta: Secondary | ICD-10-CM | POA: Diagnosis present

## 2022-02-01 DIAGNOSIS — K766 Portal hypertension: Secondary | ICD-10-CM | POA: Diagnosis not present

## 2022-02-01 DIAGNOSIS — Z880 Allergy status to penicillin: Secondary | ICD-10-CM

## 2022-02-01 DIAGNOSIS — D631 Anemia in chronic kidney disease: Secondary | ICD-10-CM | POA: Diagnosis present

## 2022-02-01 DIAGNOSIS — K922 Gastrointestinal hemorrhage, unspecified: Secondary | ICD-10-CM | POA: Diagnosis not present

## 2022-02-01 DIAGNOSIS — K3189 Other diseases of stomach and duodenum: Secondary | ICD-10-CM | POA: Diagnosis not present

## 2022-02-01 DIAGNOSIS — Z8262 Family history of osteoporosis: Secondary | ICD-10-CM

## 2022-02-01 DIAGNOSIS — A419 Sepsis, unspecified organism: Secondary | ICD-10-CM | POA: Diagnosis not present

## 2022-02-01 DIAGNOSIS — I5033 Acute on chronic diastolic (congestive) heart failure: Secondary | ICD-10-CM | POA: Diagnosis present

## 2022-02-01 DIAGNOSIS — R609 Edema, unspecified: Secondary | ICD-10-CM | POA: Diagnosis not present

## 2022-02-01 DIAGNOSIS — R0609 Other forms of dyspnea: Secondary | ICD-10-CM | POA: Diagnosis present

## 2022-02-01 DIAGNOSIS — R531 Weakness: Secondary | ICD-10-CM

## 2022-02-01 DIAGNOSIS — Z6839 Body mass index (BMI) 39.0-39.9, adult: Secondary | ICD-10-CM

## 2022-02-01 DIAGNOSIS — Z66 Do not resuscitate: Secondary | ICD-10-CM | POA: Diagnosis present

## 2022-02-01 DIAGNOSIS — E8779 Other fluid overload: Principal | ICD-10-CM

## 2022-02-01 DIAGNOSIS — I272 Pulmonary hypertension, unspecified: Secondary | ICD-10-CM | POA: Diagnosis present

## 2022-02-01 DIAGNOSIS — N186 End stage renal disease: Secondary | ICD-10-CM | POA: Diagnosis present

## 2022-02-01 DIAGNOSIS — I447 Left bundle-branch block, unspecified: Secondary | ICD-10-CM | POA: Diagnosis present

## 2022-02-01 DIAGNOSIS — R579 Shock, unspecified: Secondary | ICD-10-CM | POA: Diagnosis present

## 2022-02-01 DIAGNOSIS — Z79899 Other long term (current) drug therapy: Secondary | ICD-10-CM

## 2022-02-01 DIAGNOSIS — E876 Hypokalemia: Secondary | ICD-10-CM | POA: Diagnosis present

## 2022-02-01 DIAGNOSIS — J9811 Atelectasis: Secondary | ICD-10-CM | POA: Diagnosis present

## 2022-02-01 DIAGNOSIS — K5730 Diverticulosis of large intestine without perforation or abscess without bleeding: Secondary | ICD-10-CM | POA: Diagnosis present

## 2022-02-01 DIAGNOSIS — E785 Hyperlipidemia, unspecified: Secondary | ICD-10-CM | POA: Diagnosis present

## 2022-02-01 DIAGNOSIS — I252 Old myocardial infarction: Secondary | ICD-10-CM

## 2022-02-01 DIAGNOSIS — I2510 Atherosclerotic heart disease of native coronary artery without angina pectoris: Secondary | ICD-10-CM | POA: Diagnosis present

## 2022-02-01 DIAGNOSIS — L8990 Pressure ulcer of unspecified site, unspecified stage: Secondary | ICD-10-CM | POA: Insufficient documentation

## 2022-02-01 DIAGNOSIS — D500 Iron deficiency anemia secondary to blood loss (chronic): Secondary | ICD-10-CM | POA: Diagnosis not present

## 2022-02-01 DIAGNOSIS — R112 Nausea with vomiting, unspecified: Secondary | ICD-10-CM | POA: Diagnosis not present

## 2022-02-01 DIAGNOSIS — K219 Gastro-esophageal reflux disease without esophagitis: Secondary | ICD-10-CM | POA: Diagnosis present

## 2022-02-01 DIAGNOSIS — Z9989 Dependence on other enabling machines and devices: Secondary | ICD-10-CM | POA: Diagnosis not present

## 2022-02-01 DIAGNOSIS — E7800 Pure hypercholesterolemia, unspecified: Secondary | ICD-10-CM | POA: Diagnosis present

## 2022-02-01 DIAGNOSIS — Z992 Dependence on renal dialysis: Secondary | ICD-10-CM

## 2022-02-01 DIAGNOSIS — M810 Age-related osteoporosis without current pathological fracture: Secondary | ICD-10-CM | POA: Diagnosis present

## 2022-02-01 DIAGNOSIS — R140 Abdominal distension (gaseous): Secondary | ICD-10-CM

## 2022-02-01 DIAGNOSIS — R6520 Severe sepsis without septic shock: Secondary | ICD-10-CM | POA: Diagnosis not present

## 2022-02-01 DIAGNOSIS — R1110 Vomiting, unspecified: Secondary | ICD-10-CM | POA: Diagnosis not present

## 2022-02-01 DIAGNOSIS — Z882 Allergy status to sulfonamides status: Secondary | ICD-10-CM

## 2022-02-01 DIAGNOSIS — R9431 Abnormal electrocardiogram [ECG] [EKG]: Secondary | ICD-10-CM | POA: Diagnosis present

## 2022-02-01 DIAGNOSIS — Z90710 Acquired absence of both cervix and uterus: Secondary | ICD-10-CM

## 2022-02-01 DIAGNOSIS — Z8719 Personal history of other diseases of the digestive system: Secondary | ICD-10-CM

## 2022-02-01 LAB — COMPREHENSIVE METABOLIC PANEL
ALT: 38 U/L (ref 0–44)
AST: 67 U/L — ABNORMAL HIGH (ref 15–41)
Albumin: 2.2 g/dL — ABNORMAL LOW (ref 3.5–5.0)
Alkaline Phosphatase: 84 U/L (ref 38–126)
Anion gap: 10 (ref 5–15)
BUN: 49 mg/dL — ABNORMAL HIGH (ref 8–23)
CO2: 21 mmol/L — ABNORMAL LOW (ref 22–32)
Calcium: 8.2 mg/dL — ABNORMAL LOW (ref 8.9–10.3)
Chloride: 99 mmol/L (ref 98–111)
Creatinine, Ser: 4.22 mg/dL — ABNORMAL HIGH (ref 0.44–1.00)
GFR, Estimated: 10 mL/min — ABNORMAL LOW (ref >60)
Glucose, Bld: 169 mg/dL — ABNORMAL HIGH (ref 70–99)
Potassium: 3.6 mmol/L (ref 3.5–5.1)
Sodium: 130 mmol/L — ABNORMAL LOW (ref 135–145)
Total Bilirubin: 2 mg/dL — ABNORMAL HIGH (ref 0.3–1.2)
Total Protein: 5 g/dL — ABNORMAL LOW (ref 6.5–8.1)

## 2022-02-01 LAB — CBC WITH DIFFERENTIAL/PLATELET
Abs Immature Granulocytes: 0.04 10*3/uL (ref 0.00–0.07)
Basophils Absolute: 0 10*3/uL (ref 0.0–0.1)
Basophils Relative: 0 %
Eosinophils Absolute: 0.1 10*3/uL (ref 0.0–0.5)
Eosinophils Relative: 1 %
HCT: 24 % — ABNORMAL LOW (ref 36.0–46.0)
Hemoglobin: 7.7 g/dL — ABNORMAL LOW (ref 12.0–15.0)
Immature Granulocytes: 1 %
Lymphocytes Relative: 11 %
Lymphs Abs: 0.7 10*3/uL (ref 0.7–4.0)
MCH: 31.4 pg (ref 26.0–34.0)
MCHC: 32.1 g/dL (ref 30.0–36.0)
MCV: 98 fL (ref 80.0–100.0)
Monocytes Absolute: 0.2 10*3/uL (ref 0.1–1.0)
Monocytes Relative: 3 %
Neutro Abs: 5.8 10*3/uL (ref 1.7–7.7)
Neutrophils Relative %: 84 %
Platelets: 126 10*3/uL — ABNORMAL LOW (ref 150–400)
RBC: 2.45 MIL/uL — ABNORMAL LOW (ref 3.87–5.11)
RDW: 20 % — ABNORMAL HIGH (ref 11.5–15.5)
Smear Review: NORMAL
WBC: 6.8 10*3/uL (ref 4.0–10.5)
nRBC: 0 % (ref 0.0–0.2)

## 2022-02-01 LAB — BODY FLUID CELL COUNT WITH DIFFERENTIAL
Eos, Fluid: 0 %
Lymphs, Fluid: 58 %
Monocyte-Macrophage-Serous Fluid: 29 %
Neutrophil Count, Fluid: 13 %
Total Nucleated Cell Count, Fluid: 35 cu mm

## 2022-02-01 LAB — PROTIME-INR
INR: 1.2 (ref 0.8–1.2)
Prothrombin Time: 15.5 seconds — ABNORMAL HIGH (ref 11.4–15.2)

## 2022-02-01 LAB — LACTIC ACID, PLASMA
Lactic Acid, Venous: 6.6 mmol/L (ref 0.5–1.9)
Lactic Acid, Venous: 5.7 mmol/L (ref 0.5–1.9)
Lactic Acid, Venous: 6 mmol/L (ref 0.5–1.9)

## 2022-02-01 LAB — RESP PANEL BY RT-PCR (FLU A&B, COVID) ARPGX2
Influenza A by PCR: NEGATIVE
Influenza B by PCR: NEGATIVE
SARS Coronavirus 2 by RT PCR: NEGATIVE

## 2022-02-01 LAB — PROCALCITONIN: Procalcitonin: 3.85 ng/mL

## 2022-02-01 LAB — APTT: aPTT: 33 seconds (ref 24–36)

## 2022-02-01 LAB — AMMONIA: Ammonia: 30 umol/L (ref 9–35)

## 2022-02-01 MED ORDER — ALBUMIN HUMAN 25 % IV SOLN: 25.0000 g | Freq: Once | INTRAVENOUS | Status: DC

## 2022-02-01 MED ORDER — RIFAXIMIN 200 MG PO TABS
200.0000 mg | ORAL_TABLET | Freq: Two times a day (BID) | ORAL | Status: DC
  Administered 2022-02-01 – 2022-02-10 (×18): 200 mg via ORAL
  Filled 2022-02-01 (×20): qty 1

## 2022-02-01 MED ORDER — METOPROLOL TARTRATE 5 MG/5ML IV SOLN: 5.0000 mg | INTRAVENOUS | Status: DC | PRN

## 2022-02-01 MED ORDER — ALBUMIN HUMAN 25 % IV SOLN
12.5000 g | INTRAVENOUS | Status: DC | PRN
  Administered 2022-02-07 – 2022-02-08 (×2): 12.5 g via INTRAVENOUS
  Filled 2022-02-01: qty 50

## 2022-02-01 MED ORDER — LACTULOSE 10 GM/15ML PO SOLN
20.0000 g | Freq: Two times a day (BID) | ORAL | Status: DC
  Administered 2022-02-01 – 2022-02-02 (×2): 20 g via ORAL
  Filled 2022-02-01 (×2): qty 30

## 2022-02-01 MED ORDER — LABETALOL HCL 5 MG/ML IV SOLN: 5.0000 mg | INTRAVENOUS | Status: DC | PRN

## 2022-02-01 MED ORDER — VANCOMYCIN HCL 500 MG/100ML IV SOLN
500.0000 mg | Freq: Once | INTRAVENOUS | Status: AC
  Administered 2022-02-01: 500 mg via INTRAVENOUS
  Filled 2022-02-01: qty 100

## 2022-02-01 MED ORDER — ONDANSETRON HCL 4 MG/2ML IJ SOLN
4.0000 mg | Freq: Four times a day (QID) | INTRAMUSCULAR | Status: DC | PRN
  Administered 2022-02-09: 4 mg via INTRAVENOUS
  Filled 2022-02-01: qty 2

## 2022-02-01 MED ORDER — PANTOPRAZOLE SODIUM 40 MG IV SOLR
40.0000 mg | Freq: Two times a day (BID) | INTRAVENOUS | Status: DC
  Administered 2022-02-01 – 2022-02-05 (×8): 40 mg via INTRAVENOUS
  Filled 2022-02-01 (×8): qty 10

## 2022-02-01 MED ORDER — ADULT MULTIVITAMIN W/MINERALS CH
1.0000 | ORAL_TABLET | Freq: Every day | ORAL | Status: DC
  Administered 2022-02-02: 1 via ORAL
  Filled 2022-02-01: qty 1

## 2022-02-01 MED ORDER — VANCOMYCIN HCL 750 MG/150ML IV SOLN: 750.0000 mg | INTRAVENOUS | Status: DC | PRN

## 2022-02-01 MED ORDER — ACETAMINOPHEN 650 MG RE SUPP: 650.0000 mg | Freq: Four times a day (QID) | RECTAL | Status: DC | PRN

## 2022-02-01 MED ORDER — PRO-STAT SUGAR FREE PO LIQD
30.0000 mL | Freq: Three times a day (TID) | ORAL | Status: DC
  Administered 2022-02-02 – 2022-02-12 (×18): 30 mL via ORAL

## 2022-02-01 MED ORDER — MIDODRINE HCL 5 MG PO TABS
10.0000 mg | ORAL_TABLET | ORAL | Status: AC | PRN
  Administered 2022-02-02: 10 mg via ORAL
  Filled 2022-02-01: qty 2

## 2022-02-01 MED ORDER — HEPARIN SODIUM (PORCINE) 5000 UNIT/ML IJ SOLN
5000.0000 [IU] | Freq: Three times a day (TID) | INTRAMUSCULAR | Status: DC
  Administered 2022-02-01 – 2022-02-02 (×2): 5000 [IU] via SUBCUTANEOUS
  Filled 2022-02-01 (×2): qty 1

## 2022-02-01 MED ORDER — SODIUM CHLORIDE 0.9 % IV SOLN
1.0000 g | INTRAVENOUS | Status: DC
  Administered 2022-02-02 – 2022-02-03 (×2): 1 g via INTRAVENOUS
  Filled 2022-02-01 (×2): qty 1

## 2022-02-01 MED ORDER — SODIUM CHLORIDE 0.9 % IV SOLN
2.0000 g | Freq: Once | INTRAVENOUS | Status: AC
  Administered 2022-02-01: 2 g via INTRAVENOUS
  Filled 2022-02-01: qty 12.5

## 2022-02-01 MED ORDER — CHLORHEXIDINE GLUCONATE CLOTH 2 % EX PADS
6.0000 | MEDICATED_PAD | Freq: Every day | CUTANEOUS | Status: DC
  Administered 2022-02-01 – 2022-02-04 (×3): 6 via TOPICAL
  Filled 2022-02-01: qty 6

## 2022-02-01 MED ORDER — FAMOTIDINE 20 MG PO TABS: 20.0000 mg | ORAL_TABLET | Freq: Two times a day (BID) | ORAL | Status: DC

## 2022-02-01 MED ORDER — NITROGLYCERIN 0.4 MG SL SUBL: 0.4000 mg | SUBLINGUAL_TABLET | SUBLINGUAL | Status: DC | PRN

## 2022-02-01 MED ORDER — CHLORHEXIDINE GLUCONATE CLOTH 2 % EX PADS
6.0000 | MEDICATED_PAD | Freq: Every day | CUTANEOUS | Status: DC
  Administered 2022-02-02 – 2022-02-04 (×3): 6 via TOPICAL

## 2022-02-01 MED ORDER — DOCUSATE SODIUM 100 MG PO CAPS
100.0000 mg | ORAL_CAPSULE | Freq: Two times a day (BID) | ORAL | Status: DC
  Administered 2022-02-01 – 2022-02-12 (×12): 100 mg via ORAL
  Filled 2022-02-01 (×15): qty 1

## 2022-02-01 MED ORDER — ACETAMINOPHEN 325 MG PO TABS
650.0000 mg | ORAL_TABLET | Freq: Four times a day (QID) | ORAL | Status: DC | PRN
  Administered 2022-02-07 – 2022-02-12 (×4): 650 mg via ORAL
  Filled 2022-02-01 (×4): qty 2

## 2022-02-01 MED ORDER — METRONIDAZOLE 500 MG/100ML IV SOLN
500.0000 mg | Freq: Once | INTRAVENOUS | Status: AC
  Administered 2022-02-01: 500 mg via INTRAVENOUS
  Filled 2022-02-01: qty 100

## 2022-02-01 MED ORDER — SODIUM CHLORIDE 0.9 % IV BOLUS
500.0000 mL | Freq: Once | INTRAVENOUS | Status: AC
  Administered 2022-02-01: 500 mL via INTRAVENOUS

## 2022-02-01 MED ORDER — LIDOCAINE 5 % EX PTCH
1.0000 | MEDICATED_PATCH | CUTANEOUS | Status: DC
  Administered 2022-02-12: 1 via TRANSDERMAL
  Filled 2022-02-01 (×12): qty 1

## 2022-02-01 MED ORDER — LIDOCAINE HCL (PF) 1 % IJ SOLN
5.0000 mL | Freq: Once | INTRAMUSCULAR | Status: AC
  Administered 2022-02-01: 5 mL via INTRADERMAL
  Filled 2022-02-01: qty 5

## 2022-02-01 MED ORDER — SODIUM CHLORIDE 0.9 % IV BOLUS (SEPSIS)
250.0000 mL | Freq: Once | INTRAVENOUS | Status: AC
  Administered 2022-02-01: 250 mL via INTRAVENOUS

## 2022-02-01 MED ORDER — ONDANSETRON HCL 4 MG PO TABS: 4.0000 mg | ORAL_TABLET | Freq: Four times a day (QID) | ORAL | Status: DC | PRN

## 2022-02-01 MED ORDER — IPRATROPIUM-ALBUTEROL 0.5-2.5 (3) MG/3ML IN SOLN: 3.0000 mL | Freq: Four times a day (QID) | RESPIRATORY_TRACT | Status: DC | PRN

## 2022-02-01 MED ORDER — VANCOMYCIN HCL IN DEXTROSE 1-5 GM/200ML-% IV SOLN
1000.0000 mg | Freq: Once | INTRAVENOUS | Status: AC
  Administered 2022-02-01: 1000 mg via INTRAVENOUS
  Filled 2022-02-01: qty 200

## 2022-02-01 MED ORDER — METRONIDAZOLE 500 MG/100ML IV SOLN
500.0000 mg | Freq: Two times a day (BID) | INTRAVENOUS | Status: DC
  Administered 2022-02-01: 500 mg via INTRAVENOUS
  Filled 2022-02-01 (×2): qty 100

## 2022-02-01 MED ORDER — DICLOFENAC SODIUM 1 % EX GEL: 2.0000 g | Freq: Three times a day (TID) | CUTANEOUS | Status: DC | PRN

## 2022-02-01 NOTE — Telephone Encounter (Signed)
Please review lab results received on 02/01/22 (scanned in chart) form labs drawn on 01/30/22 by Dr. Ouida Sills.  Patient seen in clinic on 01/04/22 for new pt with check out: # NO labs today # follow up as needed  Recent Care Everywhere note with Dr. Ouida Sills:  April [name] is a 81 y.o. female seen for follow up visit at [facility]  Anemia due to chronic kidney disease, on chronic dialysis.  Hgb down to 7.3 again, no clear bleeding, fatigued  Cirrhosis of liver, Platelets down to 63. No clear bleeding, will try to get results to heme.

## 2022-02-01 NOTE — Progress Notes (Signed)
CODE SEPSIS - PHARMACY COMMUNICATION  **Broad Spectrum Antibiotics should be administered within 1 hour of Sepsis diagnosis**  Time Code Sepsis Called/Page Received: 1258  Antibiotics Ordered: Cefepime 2 g, flagyl 500 mg, and Vancomycin 1 g.  Time of 1st antibiotic administration: 1343  Additional action taken by pharmacy: N/A   Lorin Picket ,PharmD Clinical Pharmacist  02/01/2022  1:34 PM

## 2022-02-01 NOTE — ED Notes (Signed)
Pt eating dinner at this time

## 2022-02-01 NOTE — H&P (Addendum)
History and Physical   April [name]:[id number] DOB: 01-29-1941 DOA: 02/01/2022  PCP: April Pheasant, MD  Outpatient Specialists: Dr. Rockey [name], [facility] cardiology Patient coming from: [place] via [place]  I have personally briefly reviewed patient's old medical records in [place].  Chief Concern: Weakness, nausea, vomiting  HPI: April [name] is an 81 year old female with history of liver cirrhosis, end-stage renal disease on hemodialysis Tuesday Thursday Saturday, hypertension, who presents emergency department for chief concerns of weakness and tremors.  Initial vitals in the emergency department showed temperature of 98.8, respiration rate 20, heart rate of 89, blood pressure 87/50, SPO2 of 94% on room air.  Serum sodium is 130, potassium 3.6, chloride 99, bicarb 21, BUN of 49, serum creatinine of one 4.22, GFR of 10, nonfasting blood glucose 169, WBC 6.8, hemoglobin 7.7, platelets of 126.  Lactic acid was initially 6.6 and decreased to 5.7.  COVID/influenza A/influenza B PCR were negative.  Blood cultures x2 have been collected and are pending.  CT abdomen pelvis without contrast: Was read as cirrhotic liver with moderate volume ascites.  Small left and trace right pleural effusion with associated compressive atelectasis.  Moderate large hiatal hernia containing majority of the stomach within the chest.  Aortic atherosclerosis.  Anasarca.  ED treatment: Cefepime, vancomycin, metronidazole, sodium chloride 500 mL bolus. ----------------------------------- At bedside, she is able to tell me her name, age, current calendar year, current location.  She reports that she missed HD on Saturday because she was feeling nausea and vomiting. On Friday, she ate 'stinky' fish.   Patient denies vomiting on Sunday, Monday, Tuesday. She endorses vomiting today prior to ED presentation. She denies known fever. She reports always being cold all the time. She denies cough, chest pain, abdominal,  diarrhea, syncope.  She endorses worsening swelling of her lower extremities.  She endorses compliance with her lactulose and rifaximin.  Social history: She lives at [facility]. She denies tobacco use, recreational drug use. She is retired and formerly worked as Clinical cytogeneticist.   ROS: Constitutional: + weight change, no fever ENT/Mouth: no sore throat, no rhinorrhea Eyes: no eye pain, no vision changes Cardiovascular: no chest pain, + dyspnea,  + edema, no palpitations Respiratory: no cough, no sputum, no wheezing Gastrointestinal: + nausea, no vomiting, + diarrhea, no constipation Genitourinary: no urinary incontinence, no dysuria, no hematuria Musculoskeletal: no arthralgias, no myalgias Skin: no skin lesions, no pruritus, Neuro: + weakness, no loss of consciousness, no syncope Psych: no anxiety, no depression, + decrease appetite Heme/Lymph: no bruising, no bleeding  ED Course: Discussed with emergency medicine provider, patient requiring hospitalization for chief concerns of fluid overload.  Assessment/Plan  Principal Problem:   Severe sepsis (HCC) Active Problems:   Anemia in ESRD (end-stage renal disease) (HCC)   Symptomatic anemia   Edema and ascites    ESRD on dialysis ([place])   HTN (hypertension)   Hyperlipidemia   OSA on CPAP   Essential hypertension, benign   GERD (gastroesophageal reflux disease)   Morbid obesity ([place])   DOE (dyspnea on exertion)   Paroxysmal atrial fibrillation (HCC)   Weakness   Thrombocytopenia (HCC)   Anasarca   Prolonged QT interval   Assessment and Plan:  * Severe sepsis ([place]) - Etiology work-up in progress at this time - We will check for procalcitonin, blood cultures x2 have been collected and are in process, peritoneal fluid culture with cell count with differentials have been ordered by EDP and collected by EDP pending processing and lab -  Discussed with nursing staff regarding peritoneal fluid send off - Continue broad-spectrum  antibiotic with vancomycin, cefepime, metronidazole - Admit to inpatient, stepdown  Anemia in ESRD (end-stage renal disease) ([place]) - Appears to be at baseline at this time  Edema and ascites  - IR consulted for paracentesis - Albumin as needed ordered  ESRD on dialysis [facility]) - Nephrology has been consulted, Dr. Candiss [name] via secure chat - Strict I's and O's  HTN (hypertension) - Labetalol 5 mg IV every 3 hours as needed for SBP under 180, 3 doses ordered  OSA on CPAP - CPAP nightly ordered  Prolonged QT interval - Presumed secondary to anasarca - Treat the above - Continue to monitor on telemetry - Avoid scheduling medication that have been known to cause QT prolongation  Anasarca - IR consulted for paracentesis  Paroxysmal atrial fibrillation ([place]) - Patient is not on anticoagulation due to extensive discussion in the past as she is concerned for bleeding - Amiodarone not continued on admission due to hypotension - Metoprolol tartrate 5 mg IV every 2 hours as needed for heart rate greater than 120, 3 doses ordered  Morbid obesity ([place]) - This complicates overall care and prognosis.   Essential hypertension, benign - Patient takes amiodarone 200 mg daily, per skilled nursing documentation, patient took her dose this a.m. prior to ED presentation - I am holding on admission due to hypotension - Labetalol 5 mg IV as needed for SBP greater than 180 ordered, 3 doses  Chart reviewed.   DVT prophylaxis: Heparin 5000 units subcutaneous every 8 hours Code Status: DNR Diet: Renal diet Family Communication: Updated daughter-in-law at bedside with patient's permission Disposition Plan: Pending clinical course Consults called: Nephrology, IR Admission status: Stepdown, inpatient  Past Medical History:  Diagnosis Date   Anemia    CAD S/P PCI pRCA Promus DES 3.5 x 16 (4.1 mm), ostRPDA Promus DES 2.5 x 12 (2.7 mm) 03/25/2016   CAD S/P percutaneous coronary angioplasty    a.  07/2015 MV: No ischemia, EF 66%;  b. 03/2016 Inflat STEMI/PCI: LM nl, LAD 25d, RI nl, LCX nl, OM1/2 nl, RCA 80p (3.5x16 Promus Premier DES), 50p/m, 30d, RPDA 90 (2.5x12 Promu Premier DES); c. 01/2018 Cath ([place], [place]): Patent RCA stents->Med Rx.   CHF (congestive heart failure) ([place])    CKD (chronic kidney disease), stage III ([place])    Diabetes mellitus without complication ([place])    Diastolic dysfunction    a. 10/2013 Echo: EF 55-65%, Gr1 DD; b. 01/2018 Echo ([place], [place]): EF 55-60%, Gr2 DD, RVSP 50mHg.   Diverticulitis    Dyspnea    Endometriosis    Family history of adverse reaction to anesthesia    Mother - had to stay in ICU because of breathing difficulties every time she had anesthesia   GERD (gastroesophageal reflux disease)    GI bleed    a. 06/2021 - melena-->2 u PRBCs.   Hiatal hernia    History of colon polyps 08/1994   History of Migraines    Hypercholesterolemia    Hypertension    LBBB (left bundle branch block)    Osteoporosis    PAF (paroxysmal atrial fibrillation) ([place]    a. 03/2016 Dx @ time of MI; b. CHA2DS2VASc = 5-->Eliquis.   Rheumatic fever    Sleep apnea    CPAP   Spasmodic dysphonia    ST elevation myocardial infarction (STEMI) of inferolateral wall, [place] of [place] 03/25/2016   ST elevation  myocardial infarction (STEMI) of inferolateral wall, initial episode of care [facility]) 03/25/2016   Urine incontinence    Past Surgical History:  Procedure Laterality Date   ABDOMINAL HYSTERECTOMY     ovaries not removed   APPENDECTOMY     was removed during hysterectomy   Breast biopsies     x2   BREAST EXCISIONAL BIOPSY Bilateral "years ago"   neg   BREAST SURGERY Bilateral    BROW LIFT Bilateral 08/15/2019   Procedure: BLEPHAROPLASTY UPPER EYELID; W/EXCESS SKIN BLEPHAROPTOSIS REPAIR; RESECT EX;  Surgeon: Karle Starch, MD;  Location: [place];  Service: Ophthalmology;  Laterality: Bilateral;  sleep apnea    CARDIAC CATHETERIZATION  2011   moderate 40% RCA disease   CARDIAC CATHETERIZATION  01/2010   Dr. Gollan'@[facility]'$ : Only noted 40% RCA   CARDIAC CATHETERIZATION N/A 03/25/2016   Procedure: Left Heart Cath and Coronary Angiography;  Surgeon: 04/07/2016, MD;  Location: [place] CV LAB;  Service: Cardiovascular;  Laterality: N/A;   CARDIAC CATHETERIZATION N/A 03/25/2016   Procedure: Coronary Stent Intervention;  Surgeon: 04/07/2016, MD;  Location: [place] CV LAB;  Service: Cardiovascular;  Laterality: N/A;   COLONOSCOPY  2013   COLONOSCOPY WITH PROPOFOL N/A 07/11/2021   Procedure: COLONOSCOPY WITH PROPOFOL;  Surgeon: 07/24/2021, MD;  [facility] ENDOSCOPY;  Service: Endoscopy;  Laterality: N/A;   DIALYSIS/PERMA CATHETER INSERTION N/A 12/15/2021   Procedure: DIALYSIS/PERMA CATHETER INSERTION;  Surgeon: 12/28/2021, MD;  Location: [place] CV LAB;  Service: Cardiovascular;  Laterality: N/A;   ESOPHAGOGASTRODUODENOSCOPY (EGD) WITH PROPOFOL N/A 03/17/2015   Procedure: ESOPHAGOGASTRODUODENOSCOPY (EGD) WITH PROPOFOL;  Surgeon: 03/30/2015, MD;  Location: [facility] ENDOSCOPY;  Service: Gastroenterology;  Laterality: N/A;   ESOPHAGOGASTRODUODENOSCOPY (EGD) WITH PROPOFOL N/A 07/10/2021   Procedure: ESOPHAGOGASTRODUODENOSCOPY (EGD) WITH PROPOFOL;  Surgeon: 16/03/2022, MD;  Location: [facility] ENDOSCOPY;  Service: Gastroenterology;  Laterality: N/A;   GIVENS CAPSULE STUDY N/A 07/11/2021   Procedure: GIVENS CAPSULE STUDY;  Surgeon: 07/24/2021, MD;  Location: [facility] ENDOSCOPY;  Service: Endoscopy;  Laterality: N/A;   NM MYOVIEW ([place] HX)  07/2015   No evidence ischemia or infarction. EF 66%. Low risk   TEMPORARY DIALYSIS CATHETER N/A 12/08/2021   Procedure: TEMPORARY DIALYSIS CATHETER;  Surgeon: 21/01/2022, MD;  Location: [place] CV LAB;  Service: Cardiovascular;  Laterality: N/A;   TONSILLECTOMY     TRANSTHORACIC ECHOCARDIOGRAM  10/2013   Normal LV size and function. EF  55-65%. GR 1 DD. Otherwise normal.   Social History:  reports that she has never smoked. She has never used smokeless tobacco. She reports that she does not drink alcohol and does not use drugs.  Allergies  Allergen Reactions   Penicillins Other (See Comments)    Okay to take amoxicillin/(pt does not recall what the reaction to penicillin was (82-96 years old)    Sulfa Antibiotics Rash   Family History  Problem Relation Age of Onset   Arthritis Mother    Stroke Mother    Hypertension Mother    Osteoporosis Mother    Heart disease Father        MI   Heart attack Father    Stroke Brother    Heart attack Sister    Breast cancer Other        first cousin x 2   Stroke Daughter    Heart attack Daughter    Colon cancer Neg Hx    Family history: Family history reviewed and  not pertinent.  Prior to Admission medications   Medication Sig Start Date End Date Taking? Authorizing Provider  acetaminophen (TYLENOL) 325 MG tablet Take 650 mg by mouth every 4 (four) hours as needed for mild pain or fever.   Yes [provider]  Amino Acids-Protein Hydrolys (FEEDING SUPPLEMENT, PRO-STAT SUGAR FREE 64,) LIQD Take 30 mLs by mouth 3 (three) times daily with meals.   Yes [provider]  amiodarone (PACERONE) 200 MG tablet Take 1 tablet (200 mg total) by mouth daily. 12/20/21  Yes Nolberto Hanlon, MD  docusate sodium (STOOL SOFTENER) 100 MG capsule Take 1 capsule (100 mg total) by mouth 2 (two) times daily. 12/19/21  Yes Nolberto Hanlon, MD  famotidine (PEPCID) 20 MG tablet Take 20 mg by mouth 2 (two) times daily.   Yes [provider]  lactulose (CHRONULAC) 10 GM/15ML solution Take 30 mLs (20 g total) by mouth 2 (two) times daily. 12/19/21  Yes Nolberto Hanlon, MD  rifaximin (XIFAXAN) 200 MG tablet Take 1 tablet (200 mg total) by mouth 2 (two) times daily. 12/19/21  Yes Nolberto Hanlon, MD  torsemide 40 MG TABS Take 40 mg by mouth 3 (three) times a week. 12/21/21  Yes Nolberto Hanlon, MD   albuterol (VENTOLIN HFA) 108 (90 Base) MCG/ACT inhaler Inhale 2 puffs into the lungs every 6 (six) hours as needed for wheezing or shortness of breath. 01/11/22   Shelly Coss, MD  diclofenac Sodium (VOLTAREN) 1 % GEL Apply 2 g topically 3 (three) times daily as needed (pain in legs and elbows). 12/19/21   Nolberto Hanlon, MD  ipratropium-albuterol (DUONEB) 0.5-2.5 (3) MG/3ML SOLN Take 3 mLs by nebulization every 6 (six) hours as needed. 01/11/22   Shelly Coss, MD  lidocaine (LIDODERM) 5 % Place 2 patches onto the skin daily. Remove & Discard patch within 12 hours or as directed by MD Patient taking differently: Place 1 patch onto the skin daily. Remove & Discard patch within 12 hours PRN or as directed by MD 12/19/21   Nolberto Hanlon, MD  Multiple Vitamins-Minerals (MULTIVITAMIN WITH MINERALS) tablet Take 1 tablet by mouth daily.    [provider]  nitroGLYCERIN (NITROSTAT) 0.4 MG SL tablet Place 1 tablet (0.4 mg total) under the tongue every 5 (five) minutes as needed for chest pain. 12/19/21   Nolberto Hanlon, MD   Physical Exam: Vitals:   02/01/22 1136 02/01/22 1137 02/01/22 1306 02/01/22 1430  BP: (!) 87/50  (!) 104/42 (!) 98/46  Pulse:   82 74  Resp:   19 17  Temp:   98.5 F (36.9 C)   TempSrc:   Oral   SpO2:   99% 98%  Weight:  77.1 kg    Height:  '4\' 9"'$  (1.448 m)     Constitutional: appears older than chronological age, frail, NAD, calm, comfortable Eyes: PERRL, lids and conjunctivae normal ENMT: Mucous membranes are moist. Posterior pharynx clear of any exudate or lesions. Age-appropriate dentition. Hearing appropriate Neck: normal, supple, no masses, no thyromegaly Respiratory: clear to auscultation bilaterally, no wheezing, no crackles. Normal respiratory effort. No accessory muscle use.  Cardiovascular: Regular rate and rhythm, no murmurs / rubs / gallops.  Anasarca. 2+ pedal pulses. No carotid bruits.  Abdomen: Morbidly obese abdomen with pannus, no tenderness, no masses  palpated, no hepatosplenomegaly. Bowel sounds positive.  Musculoskeletal: no clubbing / cyanosis. No joint deformity upper and lower extremities. Good ROM, no contractures, no atrophy. Normal muscle tone.  Skin: no rashes, lesions, ulcers. No induration Neurologic:  Sensation intact. Strength 5/5 in all 4.  Psychiatric: Normal judgment and insight. Alert and oriented x 3. Normal mood.   EKG: independently reviewed, showing sinus rhythm with rate of 85, QTc 490  Chest x-ray on Admission: I personally reviewed and I agree with radiologist reading as below.  CT Abdomen Pelvis Wo Contrast  Result Date: 02/01/2022 CLINICAL DATA:  Abdominal pain, acute, nonlocalized EXAM: CT ABDOMEN AND PELVIS WITHOUT CONTRAST TECHNIQUE: Multidetector CT imaging of the abdomen and pelvis was performed following the standard protocol without IV contrast. RADIATION DOSE REDUCTION: This exam was performed according to the departmental dose-optimization program which includes automated exposure control, adjustment of the mA and/or kV according to patient size and/or use of iterative reconstruction technique. COMPARISON:  11/29/2020 FINDINGS: Lower chest: Small left and trace right pleural effusions with associated compressive atelectasis. Heart size within normal limits. Relative hypoattenuation of the cardiac blood pool indicative of anemia. Moderate-large hiatal hernia. Hepatobiliary: Shrunken, nodular appearance of the liver compatible with cirrhosis. No definite liver lesion identified on unenhanced images. Moderately distended gallbladder. No hyperdense gallstone. Pancreas: Unremarkable. No pancreatic ductal dilatation or surrounding inflammatory changes. Spleen: Normal in size without focal abnormality. Adrenals/Urinary Tract: Adrenal glands are unremarkable. Kidneys are normal, without renal calculi, solid lesion, or hydronephrosis. Bladder is unremarkable. Stomach/Bowel: Moderate-large hiatal hernia containing the majority  of the stomach within the chest. Appendix not visualized, and may be surgically absent. Left-sided colonic diverticulosis. No evidence of bowel wall thickening, distention, or inflammatory changes. Vascular/Lymphatic: Aortic atherosclerosis. No enlarged abdominal or pelvic lymph nodes. Reproductive: Status post hysterectomy. No adnexal masses. Other: Moderate volume ascites. No organized abdominopelvic fluid collections. No pneumoperitoneum. Musculoskeletal: Anasarca.  No acute bony findings. IMPRESSION: 1. Cirrhotic liver with moderate volume ascites. 2. Small left and trace right pleural effusions with associated compressive atelectasis. 3. Moderate-large hiatal hernia containing the majority of the stomach within the chest. 4. Left-sided colonic diverticulosis without evidence for acute diverticulitis. 5. Anasarca. 6. Aortic atherosclerosis (ICD10-I70.0). 7. Relative hypoattenuation of the cardiac blood pool indicative of anemia. Electronically Signed   By: Davina Poke D.O.   On: 02/01/2022 13:40   DG Chest 2 View  Result Date: 02/01/2022 CLINICAL DATA:  Weakness with vomiting.  Suspected sepsis. EXAM: CHEST - 2 VIEW COMPARISON:  Radiographs 01/11/2022 and 01/09/2022.  CT 01/11/2022. FINDINGS: Right IJ hemodialysis catheter is unchanged, projecting to the level of the upper right atrium. The heart size and mediastinal contours are stable with a large hiatal hernia. Unchanged small bilateral pleural effusions with associated left greater than right basilar atelectasis. No edema or confluent airspace opacity identified. There is no pneumothorax. The bones appear unchanged. IMPRESSION: Stable chest with small bilateral pleural effusions and bibasilar atelectasis. Large hiatal hernia. No acute findings. Electronically Signed   By: Richardean Sale M.D.   On: 02/01/2022 12:36    Labs on Admission: I have personally reviewed following labs CBC: Recent Labs  Lab 01/30/22 0000 02/01/22 1153  WBC 9.5 6.8   NEUTROABS 6.20 5.8  HGB 7.4* 7.7*  HCT 23* 24.0*  MCV  --  98.0  PLT 102* 509*   Basic Metabolic Panel: Recent Labs  Lab 02/01/22 1152  NA 130*  K 3.6  CL 99  CO2 21*  GLUCOSE 169*  BUN 49*  CREATININE 4.22*  CALCIUM 8.2*   GFR: Estimated Creatinine Clearance: 8.6 mL/min (A) (by C-G formula based on SCr of 4.22 mg/dL (H)). Liver Function Tests: Recent Labs  Lab 02/01/22 1152  AST 67*  ALT 38  ALKPHOS 84  BILITOT 2.0*  PROT 5.0*  ALBUMIN 2.2*   Coagulation Profile: Recent Labs  Lab 02/01/22 1152  INR 1.2   Urine analysis:    Component Value Date/Time   COLORURINE STRAW (A) 10/17/2021 2030   APPEARANCEUR CLEAR (A) 10/17/2021 2030   LABSPEC 1.005 10/17/2021 2030   PHURINE 6.0 10/17/2021 2030   GLUCOSEU NEGATIVE 10/17/2021 2030   GLUCOSEU NEGATIVE 12/15/2016 1117   HGBUR MODERATE (A) 10/17/2021 2030   BILIRUBINUR NEGATIVE 10/17/2021 2030   [place] 10/17/2021 2030   PROTEINUR 30 (A) 10/17/2021 2030   UROBILINOGEN 0.2 12/15/2016 1117   NITRITE NEGATIVE 10/17/2021 2030   LEUKOCYTESUR NEGATIVE 10/17/2021 2030   CRITICAL CARE Performed by: Dr. Tobie Poet  Total critical care time: 35 minutes  Critical care time was exclusive of separately billable procedures and treating other patients.  Critical care was necessary to treat or prevent imminent or life-threatening deterioration.  Critical care was time spent personally by me on the following activities: development of treatment plan with patient and/or surrogate as well as nursing, discussions with consultants, evaluation of patient's response to treatment, examination of patient, obtaining history from patient or surrogate, ordering and performing treatments and interventions, ordering and review of laboratory studies, ordering and review of radiographic studies, pulse oximetry and re-evaluation of patient's condition.  Dr. Tobie Poet [facility]  If 7PM-7AM, please contact overnight-coverage  provider If 7AM-7PM, please contact day coverage provider [email or web address]  02/01/2022, 6:31 PM

## 2022-02-01 NOTE — Assessment & Plan Note (Signed)
-   Etiology work-up in progress at this time - We will check for procalcitonin, blood cultures x2 have been collected and are in process, peritoneal fluid culture with cell count with differentials have been ordered by EDP and collected by EDP pending processing and lab - Discussed with nursing staff regarding peritoneal fluid send off - Continue broad-spectrum antibiotic with vancomycin, cefepime, metronidazole - Admit to inpatient, stepdown

## 2022-02-01 NOTE — Assessment & Plan Note (Signed)
-   Labetalol 5 mg IV every 3 hours as needed for SBP under 180, 3 doses ordered

## 2022-02-01 NOTE — Assessment & Plan Note (Signed)
-   Patient takes amiodarone 200 mg daily, per skilled nursing documentation, patient took her dose this a.m. prior to ED presentation - I am holding on admission due to hypotension - Labetalol 5 mg IV as needed for SBP greater than 180 ordered, 3 doses

## 2022-02-01 NOTE — ED Provider Notes (Signed)
[facility] Provider Note    Event Date/Time   First MD Initiated Contact with Patient 02/01/22 1230     (approximate)   History   Weakness and Emesis   HPI  April [name] is a 81 y.o. 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  States that she has not been feeling well for the past couple of days.  Missed dialysis on Saturday secondary to not feeling well.  Complaining of progressively worsening abdominal distention with shortness of breath.  Nausea and vomiting and states that she has had multiple episodes of shaking.  Endorses generalized weakness and fatigue.  Did go to dialysis yesterday for full session.  States that she normally gets her rifaximin and dialysis.      Physical Exam   Triage Vital Signs: ED Triage Vitals  Enc Vitals Group     BP 02/01/22 1136 (!) 87/50     Pulse Rate 02/01/22 1132 89     Resp 02/01/22 1132 20     Temp 02/01/22 1132 98.8 F (37.1 C)     Temp Source 02/01/22 1132 Oral     SpO2 02/01/22 1132 94 %     Weight 02/01/22 1137 170 lb (77.1 kg)     Height 02/01/22 1137 '4\' 9"'$  (1.448 m)     Head Circumference --      Peak Flow --      Pain Score 02/01/22 1136 0     Pain Loc --      Pain Edu? --      Excl. in [place]? --     Most recent vital signs: Vitals:   02/01/22 1306 02/01/22 1430  BP: (!) 104/42 (!) 98/46  Pulse: 82 74  Resp: 19 17  Temp: 98.5 F (36.9 C)   SpO2: 99% 98%    Physical Exam  General: Awake, lying in bed, daughter at bedside CV:  Good peripheral perfusion.  Resp:  Normal effort.  Abd:  Abdominal distention with fluid wave Other:  Lower extremity pitting edema up to the abdomen.  Erythema and warmth to the right hip.  No crepitus.   ED Results / Procedures / Treatments   Labs (all labs ordered are listed, but only abnormal results are displayed) Labs Reviewed  LACTIC  ACID, PLASMA - Abnormal; Notable for the following components:      Result Value   Lactic Acid, Venous 6.6 (*)    All other components within normal limits  LACTIC ACID, PLASMA - Abnormal; Notable for the following components:   Lactic Acid, Venous 5.7 (*)    All other components within normal limits  CBC WITH DIFFERENTIAL/PLATELET - Abnormal; Notable for the following components:   RBC 2.45 (*)    Hemoglobin 7.7 (*)    HCT 24.0 (*)    RDW 20.0 (*)    Platelets 126 (*)    All other components within normal limits  COMPREHENSIVE METABOLIC PANEL - Abnormal; Notable for the following components:   Sodium 130 (*)    CO2 21 (*)    Glucose, Bld 169 (*)    BUN 49 (*)    Creatinine, Ser 4.22 (*)    Calcium 8.2 (*)    Total Protein 5.0 (*)    Albumin 2.2 (*)    AST 67 (*)    Total Bilirubin 2.0 (*)    GFR, Estimated 10 (*)  All other components within normal limits  PROTIME-INR - Abnormal; Notable for the following components:   Prothrombin Time 15.5 (*)    All other components within normal limits  RESP PANEL BY RT-PCR (FLU A&B, COVID) ARPGX2  CULTURE, BLOOD (ROUTINE X 2)  CULTURE, BLOOD (ROUTINE X 2)  URINE CULTURE  BODY FLUID CULTURE W GRAM STAIN  APTT  URINALYSIS, ROUTINE W REFLEX MICROSCOPIC  URINALYSIS, COMPLETE (UACMP) WITH MICROSCOPIC  BODY FLUID CELL COUNT WITH DIFFERENTIAL  PROCALCITONIN     EKG  No tachycardic or bradycardic dysrhythmias while on cardiac telemetry   RADIOLOGY   CT scan of the abdomen and pelvis independently reviewed with signs of ascites.  CT scan was read as cirrhosis with worsening ascites significant hiatal hernia.  PROCEDURES:  Critical Care performed: Yes, see critical care procedure note(s)  .Paracentesis  Date/Time: 02/01/2022 4:09 PM  Performed by: Nathaniel Man, MD Authorized by: Nathaniel Man, MD   Consent:    Consent obtained:  Verbal and written   Consent given by:  Patient   Risks, benefits, and alternatives were  discussed: yes     Risks discussed:  Bleeding, bowel perforation, infection and pain   Alternatives discussed:  Delayed treatment and alternative treatment Universal protocol:    Procedure explained and questions answered to patient or proxy's satisfaction: yes     Relevant documents present and verified: yes     Test results available: yes     Imaging studies available: yes     Required blood products, implants, devices, and special equipment available: yes     Site/side marked: yes     Immediately prior to procedure, a time out was called: yes     Patient identity confirmed:  Verbally with patient, hospital-assigned identification number, anonymous protocol, patient vented/unresponsive and arm band Pre-procedure details:    Procedure purpose:  Diagnostic   Preparation: Patient was prepped and draped in usual sterile fashion   Anesthesia:    Anesthesia method:  Local infiltration   Local anesthetic:  Lidocaine 1% w/o epi Procedure details:    Needle gauge:  20   Ultrasound guidance: yes     Puncture site:  R lower quadrant   Fluid removed amount:  10 cc   Fluid appearance:  Clear   Dressing:  4x4 sterile gauze and adhesive bandage Post-procedure details:    Procedure completion:  Tolerated  CRITICAL CARE Performed by: Nathaniel Man   Total critical care time: 45 minutes  Critical care time was exclusive of separately billable procedures and treating other patients.  Critical care was necessary to treat or prevent imminent or life-threatening deterioration.  Critical care was time spent personally by me on the following activities: development of treatment plan with patient and/or surrogate as well as nursing, discussions with consultants, evaluation of patient's response to treatment, examination of patient, obtaining history from patient or surrogate, ordering and performing treatments and interventions, ordering and review of laboratory studies, ordering and review of  radiographic studies, pulse oximetry and re-evaluation of patient's condition.   MEDICATIONS ORDERED IN ED: Medications  vancomycin (VANCOCIN) IVPB 1000 mg/200 mL premix (has no administration in time range)  rifaximin (XIFAXAN) tablet 200 mg (has no administration in time range)  nitroGLYCERIN (NITROSTAT) SL tablet 0.4 mg (has no administration in time range)  lactulose (CHRONULAC) 10 GM/15ML solution 20 g (has no administration in time range)  feeding supplement (PRO-STAT SUGAR FREE 64) liquid 30 mL (has no administration in time range)  multivitamin with minerals tablet  1 tablet (has no administration in time range)  ipratropium-albuterol (DUONEB) 0.5-2.5 (3) MG/3ML nebulizer solution 3 mL (has no administration in time range)  famotidine (PEPCID) tablet 20 mg (has no administration in time range)  docusate sodium (COLACE) capsule 100 mg (has no administration in time range)  acetaminophen (TYLENOL) tablet 650 mg (has no administration in time range)    Or  acetaminophen (TYLENOL) suppository 650 mg (has no administration in time range)  ondansetron (ZOFRAN) tablet 4 mg (has no administration in time range)    Or  ondansetron (ZOFRAN) injection 4 mg (has no administration in time range)  heparin injection 5,000 Units (has no administration in time range)  metroNIDAZOLE (FLAGYL) IVPB 500 mg (has no administration in time range)  sodium chloride 0.9 % bolus 250 mL (has no administration in time range)  sodium chloride 0.9 % bolus 500 mL (0 mLs Intravenous Stopped 02/01/22 1409)  ceFEPIme (MAXIPIME) 2 g in sodium chloride 0.9 % 100 mL IVPB (0 g Intravenous Stopped 02/01/22 1413)  metroNIDAZOLE (FLAGYL) IVPB 500 mg (500 mg Intravenous New Bag/Given 02/01/22 1439)  lidocaine (PF) (XYLOCAINE) 1 % injection 5 mL (5 mLs Intradermal Given 02/01/22 1515)     IMPRESSION / MDM / ASSESSMENT AND PLAN / ED COURSE  I reviewed the triage vital signs and the nursing notes.   Differential diagnosis  includes, but is not limited to, SBP, intra-abdominal infection, pneumonia, urinary tract infection, infection to her right PermCath, progression of cirrhosis  Lab work significant elevation of her lactic acid up to 6.6.  Initial blood pressure was hypotensive so patient was started on sepsis protocol.  Patient was obviously volume overload, felt that 30 cc/kg would be detrimental to the patient.  Ordered IV antibiotics for unknown source.   No significant electrolyte abnormalities, mild hyponatremia likely secondary to her volume overload.  Did not meet criteria for emergent dialysis.  Diagnostic paracentesis performed to rule out SBP.  Repeat lactic acid downtrending.  Consult to the hospitalist and discussed with Dr. Tobie Poet who recommended admission for sepsis.    Patient's presentation is most consistent with acute presentation with potential threat to life or bodily function.   FINAL CLINICAL IMPRESSION(S) / ED DIAGNOSES   Final diagnoses:  Other hypervolemia  Cirrhosis of liver with ascites, unspecified hepatic cirrhosis type (HCC)  Abdominal distension  Lactic acidosis  Hypotension, unspecified hypotension type     Rx / DC Orders   ED Discharge Orders     None        Note:  This document was prepared using Dragon voice recognition software and may include unintentional dictation errors.   Nathaniel Man, MD 02/01/22 1615

## 2022-02-01 NOTE — Telephone Encounter (Signed)
Orders entered

## 2022-02-01 NOTE — Consult Note (Signed)
Pharmacy Antibiotic Note  April [name] is a 81 y.o. female admitted on 02/01/2022 with sepsis.  Pharmacy has been consulted for cefepime and vancomycin dosing. Patient is also ordered metronidazole.  Patient is ESRD getting HD on T/Th/Sa.  Increasing abd distention and SOB.  Empiric therapy with no clear source.  Plan: Cefepime 1 g IV every 24 h --Scheduled at 1800 to be given after HD sessions  Vancomycin 1500 mg IV LD (1000 mg + 500 mg) followed by vancomycin 750 mg IV qHD (anticipate this will be T/Th/Sa per home regimen) --Continue to follow HD plan per nephrology --Levels at steady state or as indicated  Height: '4\' 9"'$  (144.8 cm) Weight: 77.1 kg (170 lb) IBW/kg (Calculated) : 38.6  Temp (24hrs), Avg:98.7 F (37.1 C), Min:98.5 F (36.9 C), Max:98.8 F (37.1 C)  Recent Labs  Lab 01/30/22 0000 02/01/22 1152 02/01/22 1153 02/01/22 1442  WBC 9.5  --  6.8  --   CREATININE  --  4.22*  --   --   LATICACIDVEN  --  6.6*  --  5.7*    Estimated Creatinine Clearance: 8.6 mL/min (A) (by C-G formula based on SCr of 4.22 mg/dL (H)).    Allergies  Allergen Reactions   Penicillins Other (See Comments)    Okay to take amoxicillin/(pt does not recall what the reaction to penicillin was (73-20 years old)    Sulfa Antibiotics Rash    Antimicrobials this admission: vancomycin 10/4 >>  cefepime 10/4 >>  Flagyl 10/4 >>  Dose adjustments this admission: N/A  Microbiology results: 10/4 BCx: pending 10/4 UCx: pending   Thank you for allowing pharmacy to be a part of this patient's care.  Lorin Picket 02/01/2022 4:23 PM

## 2022-02-01 NOTE — Assessment & Plan Note (Signed)
-   IR consulted for paracentesis

## 2022-02-01 NOTE — Consult Note (Signed)
PHARMACY -  BRIEF ANTIBIOTIC NOTE   Pharmacy has received consult(s) for cefepime and vancomycin from an ED provider.  The patient's profile has been reviewed for ht/wt/allergies/indication/available labs.    One time order(s) placed for vancomycin 1 gram and cefepime 2 grams.  Further antibiotics/pharmacy consults should be ordered by admitting physician if indicated.                       Thank you, Lorin Picket 02/01/2022  1:09 PM

## 2022-02-01 NOTE — Assessment & Plan Note (Signed)
-   Presumed secondary to anasarca - Treat the above - Continue to monitor on telemetry - Avoid scheduling medication that have been known to cause QT prolongation

## 2022-02-01 NOTE — ED Notes (Signed)
Date and time results received: 02/01/22 1239  Test: Lactic acid Critical Value: 6.6  Name of Provider Notified: Dr. Jori Moll

## 2022-02-01 NOTE — Hospital Course (Addendum)
April [name] is an 81 year old female with history of liver cirrhosis, end-stage renal disease on hemodialysis Tuesday Thursday Saturday, hypertension, who presents emergency department for chief concerns of weakness and tremors.  Initial vitals in the emergency department showed temperature of 98.8, respiration rate 20, heart rate of 89, blood pressure 87/50, SPO2 of 94% on room air.  Serum sodium is 130, potassium 3.6, chloride 99, bicarb 21, BUN of 49, serum creatinine of one 4.22, GFR of 10, nonfasting blood glucose 169, WBC 6.8, hemoglobin 7.7, platelets of 126.  Lactic acid was initially 6.6 and decreased to 5.7.  COVID/influenza A/influenza B PCR were negative.  Blood cultures x2 have been collected and are pending.  CT abdomen pelvis without contrast: Was read as cirrhotic liver with moderate volume ascites.  Small left and trace right pleural effusion with associated compressive atelectasis.  Moderate large hiatal hernia containing majority of the stomach within the chest.  Aortic atherosclerosis.  Anasarca.  ED treatment: Cefepime, vancomycin, metronidazole, sodium chloride 500 mL bolus.

## 2022-02-01 NOTE — Sepsis Progress Note (Signed)
ELink tracking the Code Sepsis. 

## 2022-02-01 NOTE — Assessment & Plan Note (Addendum)
-   Patient is not on anticoagulation due to extensive discussion in the past as she is concerned for bleeding - Amiodarone not continued on admission due to hypotension - Metoprolol tartrate 5 mg IV every 2 hours as needed for heart rate greater than 120, 3 doses ordered

## 2022-02-01 NOTE — ED Triage Notes (Signed)
Patient arrived POV by son from [place] [facility] for rehab. Pt c/o waking up with weakness, emesis, and tremors. Dialysis Tuesday, Thurs, Saturday. Access right chest. A&O x4 during triage

## 2022-02-01 NOTE — ED Notes (Signed)
Pt up to restroom with assistance.

## 2022-02-01 NOTE — Assessment & Plan Note (Signed)
-   Nephrology has been consulted, Dr. Candiss Norse via secure chat - Strict I's and O's

## 2022-02-01 NOTE — Assessment & Plan Note (Signed)
-   This complicates overall care and prognosis.  

## 2022-02-01 NOTE — Assessment & Plan Note (Signed)
-   CPAP nightly ordered 

## 2022-02-01 NOTE — Sepsis Progress Note (Signed)
Notified bedside nurse of need to administer antibiotics and collect a repeat Lactate.

## 2022-02-01 NOTE — Assessment & Plan Note (Signed)
-   Appears to be at baseline at this time

## 2022-02-01 NOTE — Assessment & Plan Note (Signed)
-   IR consulted for paracentesis - Albumin as needed ordered

## 2022-02-02 ENCOUNTER — Encounter: Payer: Self-pay | Admitting: Internal Medicine

## 2022-02-02 ENCOUNTER — Inpatient Hospital Stay: Payer: Medicare Other

## 2022-02-02 DIAGNOSIS — R6520 Severe sepsis without septic shock: Secondary | ICD-10-CM

## 2022-02-02 DIAGNOSIS — A419 Sepsis, unspecified organism: Secondary | ICD-10-CM | POA: Diagnosis not present

## 2022-02-02 DIAGNOSIS — R579 Shock, unspecified: Secondary | ICD-10-CM

## 2022-02-02 LAB — COMPREHENSIVE METABOLIC PANEL
ALT: 38 U/L (ref 0–44)
AST: 78 U/L — ABNORMAL HIGH (ref 15–41)
Albumin: 2.2 g/dL — ABNORMAL LOW (ref 3.5–5.0)
Alkaline Phosphatase: 63 U/L (ref 38–126)
Anion gap: 15 (ref 5–15)
BUN: 54 mg/dL — ABNORMAL HIGH (ref 8–23)
CO2: 18 mmol/L — ABNORMAL LOW (ref 22–32)
Calcium: 8.3 mg/dL — ABNORMAL LOW (ref 8.9–10.3)
Chloride: 100 mmol/L (ref 98–111)
Creatinine, Ser: 4.6 mg/dL — ABNORMAL HIGH (ref 0.44–1.00)
GFR, Estimated: 9 mL/min — ABNORMAL LOW (ref >60)
Glucose, Bld: 145 mg/dL — ABNORMAL HIGH (ref 70–99)
Potassium: 3.6 mmol/L (ref 3.5–5.1)
Sodium: 133 mmol/L — ABNORMAL LOW (ref 135–145)
Total Bilirubin: 2.1 mg/dL — ABNORMAL HIGH (ref 0.3–1.2)
Total Protein: 4.8 g/dL — ABNORMAL LOW (ref 6.5–8.1)

## 2022-02-02 LAB — C DIFFICILE QUICK SCREEN W PCR REFLEX
C Diff antigen: NEGATIVE
C Diff interpretation: NOT DETECTED
C Diff toxin: NEGATIVE

## 2022-02-02 LAB — PATHOLOGIST SMEAR REVIEW

## 2022-02-02 LAB — GASTROINTESTINAL PANEL BY PCR, STOOL (REPLACES STOOL CULTURE)

## 2022-02-02 LAB — MAGNESIUM: Magnesium: 1.8 mg/dL (ref 1.7–2.4)

## 2022-02-02 LAB — CBC
HCT: 19.9 % — ABNORMAL LOW (ref 36.0–46.0)
Hemoglobin: 6.6 g/dL — ABNORMAL LOW (ref 12.0–15.0)
MCH: 31.7 pg (ref 26.0–34.0)
MCHC: 33.2 g/dL (ref 30.0–36.0)
MCV: 95.7 fL (ref 80.0–100.0)
Platelets: 101 10*3/uL — ABNORMAL LOW (ref 150–400)
RBC: 2.08 MIL/uL — ABNORMAL LOW (ref 3.87–5.11)
RDW: 20 % — ABNORMAL HIGH (ref 11.5–15.5)
WBC: 13.4 10*3/uL — ABNORMAL HIGH (ref 4.0–10.5)
nRBC: 0.1 % (ref 0.0–0.2)

## 2022-02-02 LAB — PHOSPHORUS: Phosphorus: 4.2 mg/dL (ref 2.5–4.6)

## 2022-02-02 LAB — OCCULT BLOOD X 1 CARD TO LAB, STOOL: Fecal Occult Bld: POSITIVE — AB

## 2022-02-02 LAB — BRAIN NATRIURETIC PEPTIDE: B Natriuretic Peptide: 1203.3 pg/mL — ABNORMAL HIGH (ref 0.0–100.0)

## 2022-02-02 LAB — HEMOGLOBIN AND HEMATOCRIT, BLOOD
HCT: 22.4 % — ABNORMAL LOW (ref 36.0–46.0)
Hemoglobin: 7.4 g/dL — ABNORMAL LOW (ref 12.0–15.0)

## 2022-02-02 LAB — MRSA NEXT GEN BY PCR, NASAL: MRSA by PCR Next Gen: NOT DETECTED

## 2022-02-02 LAB — CORTISOL-AM, BLOOD: Cortisol - AM: 32.3 ug/dL — ABNORMAL HIGH (ref 6.7–22.6)

## 2022-02-02 LAB — HEPATITIS B CORE ANTIBODY, TOTAL: Hep B Core Total Ab: NONREACTIVE

## 2022-02-02 LAB — TROPONIN I (HIGH SENSITIVITY)
Troponin I (High Sensitivity): 22 ng/L — ABNORMAL HIGH (ref <18)
Troponin I (High Sensitivity): 32 ng/L — ABNORMAL HIGH (ref <18)

## 2022-02-02 LAB — HEPATITIS B SURFACE ANTIGEN: Hepatitis B Surface Ag: NONREACTIVE

## 2022-02-02 LAB — PREPARE RBC (CROSSMATCH)

## 2022-02-02 LAB — HEPATITIS B CORE ANTIBODY, IGM: Hep B C IgM: NONREACTIVE

## 2022-02-02 MED ORDER — NOREPINEPHRINE 4 MG/250ML-% IV SOLN
2.0000 ug/min | INTRAVENOUS | Status: DC
  Administered 2022-02-02: 2 ug/min via INTRAVENOUS
  Administered 2022-02-02: 3 ug/min via INTRAVENOUS
  Administered 2022-02-03: 1 ug/min via INTRAVENOUS
  Administered 2022-02-04: 4 ug/min via INTRAVENOUS
  Filled 2022-02-02 (×4): qty 250

## 2022-02-02 MED ORDER — SODIUM CHLORIDE 0.9 % IV SOLN: 250.0000 mL | INTRAVENOUS | Status: DC

## 2022-02-02 MED ORDER — NEPRO/CARBSTEADY PO LIQD: 237.0000 mL | Freq: Three times a day (TID) | ORAL | Status: DC

## 2022-02-02 MED ORDER — LACTATED RINGERS IV BOLUS
1000.0000 mL | Freq: Once | INTRAVENOUS | Status: AC
  Administered 2022-02-02: 1000 mL via INTRAVENOUS

## 2022-02-02 MED ORDER — RENA-VITE PO TABS
1.0000 | ORAL_TABLET | Freq: Every day | ORAL | Status: DC
  Administered 2022-02-02 – 2022-02-11 (×10): 1 via ORAL
  Filled 2022-02-02 (×10): qty 1

## 2022-02-02 MED ORDER — HEPARIN SODIUM (PORCINE) 1000 UNIT/ML DIALYSIS: 20.0000 [IU]/kg | INTRAMUSCULAR | Status: DC | PRN

## 2022-02-02 MED ORDER — FAMOTIDINE 20 MG PO TABS
20.0000 mg | ORAL_TABLET | Freq: Every day | ORAL | Status: DC
  Administered 2022-02-02 – 2022-02-03 (×2): 20 mg via ORAL
  Filled 2022-02-02 (×2): qty 1

## 2022-02-02 MED ORDER — MIDODRINE HCL 5 MG PO TABS
10.0000 mg | ORAL_TABLET | Freq: Three times a day (TID) | ORAL | Status: DC
  Administered 2022-02-02 – 2022-02-12 (×32): 10 mg via ORAL
  Filled 2022-02-02 (×32): qty 2

## 2022-02-02 MED ORDER — ALBUMIN HUMAN 25 % IV SOLN
12.5000 g | Freq: Once | INTRAVENOUS | Status: AC
  Administered 2022-02-02: 12.5 g via INTRAVENOUS
  Filled 2022-02-02: qty 50

## 2022-02-02 MED ORDER — SODIUM CHLORIDE 0.9% IV SOLUTION: Freq: Once | INTRAVENOUS | Status: DC

## 2022-02-02 NOTE — Progress Notes (Signed)
PT did 3.5 hrs of HD, tolerated well with no signs of distress  UF = 2000 ml  1 unit of blood given Hep b labs drawn  Report given to floor RN. Bp 105/37 Temp 97.7 Rr 20 Hr 79

## 2022-02-02 NOTE — Assessment & Plan Note (Signed)
No aspirin due to GI bleeding.  No statin due to cirrhosis.   

## 2022-02-02 NOTE — Progress Notes (Signed)
A&OX4. Pt tolerated HD this shift with levophed gtt. 1 unit PRBCs administered. Pt had multiple loose grade 6-7 dark stools. MD made aware. Stool sample sent to lab. Rectal tube inserted and pt tolerated well. Denies pain. Oliguric.

## 2022-02-02 NOTE — Assessment & Plan Note (Addendum)
Blood pressure as outlined.  Continues on torsemide. Follow pressures.  Follow metabolic panel. Labs to be checked at dialysis.

## 2022-02-02 NOTE — Progress Notes (Signed)
       CROSS COVER NOTE  NAME: CHRISTYNA LETENDRE MRN: [id number] DOB : December 24, 1940    Date of Service   02/02/2022   HPI/Events of Note   Notified of hypotension 82/34 MAP 50. M(r)s Jerkins is admitted with Sepsis, unknown source suspected to be SBP. BP low in ED and patient given IVF.   M(r)s Alviar is an 81 year old female with history of liver cirrhosis, end-stage renal disease on hemodialysis Tuesday Thursday Saturday, hypertension, who presents emergency department for chief concerns of weakness and tremors.   Interventions   Assessment/Plan:  Suspected Sepsis with Shock Albumin 12.5g PO Midodrine x1  Levophed, MAP goal >60. PCCM consulted      This document was prepared using Dragon voice recognition software and may include unintentional dictation errors.  Neomia Glass DNP, MHA, FNP-BC Nurse Practitioner [facility] Pager [phone]

## 2022-02-02 NOTE — Progress Notes (Signed)
An USGPIV (ultrasound guided PIV) has been placed for short-term vasopressor infusion. A correctly placed ivWatch must be used when administering Vasopressors. Should this treatment be needed beyond 72 hours, central line access should be obtained.  It will be the responsibility of the bedside nurse to follow best practice to prevent extravasations.   ?

## 2022-02-02 NOTE — Progress Notes (Addendum)
[facility] [place] NAME: April [name]    MR#:  [id number]  DATE OF BIRTH:  1940-08-08  SUBJECTIVE:   Daughter-in-law at bedside. Patient came from [place] after she started having generalized weakness, tremors and vomiting Missed dialysis on last Saturday. Got dialysis on Tuesday. Found to have hemoglobin of 6.6. Received one unit blood transfusion today. Overall breathing stable. Has had extensive G.I. workup   VITALS:  Blood pressure (!) 118/40, pulse 81, temperature 97.7 F (36.5 C), temperature source Oral, resp. rate 17, height '4\' 9"'$  (1.448 m), weight 81.1 kg, last menstrual period 05/01/1972, SpO2 98 %.  PHYSICAL EXAMINATION:   GENERAL:  81 y.o.-year-old patient lying in the bed with no acute distress.  Chronically ill, weak Anasarca LUNGS: Normal breath sounds bilaterally, no wheezing CARDIOVASCULAR: S1, S2 normal. No murmurs,   ABDOMEN: Soft, nontender, nondistended. Bowel sounds present.  EXTREMITIES: chronic  edema b/l.    NEUROLOGIC: nonfocal  patient is alert and awake SKIN: per rn  LABORATORY PANEL:  CBC Recent Labs  Lab 02/02/22 0420  WBC 13.4*  HGB 6.6*  HCT 19.9*  PLT 101*    Chemistries  Recent Labs  Lab 02/02/22 0420  NA 133*  K 3.6  CL 100  CO2 18*  GLUCOSE 145*  BUN 54*  CREATININE 4.60*  CALCIUM 8.3*  MG 1.8  AST 78*  ALT 38  ALKPHOS 63  BILITOT 2.1*   Cardiac Enzymes No results for input(s): "TROPONINI" in the last 168 hours. RADIOLOGY:  CT Abdomen Pelvis Wo Contrast  Result Date: 02/01/2022 CLINICAL DATA:  Abdominal pain, acute, nonlocalized EXAM: CT ABDOMEN AND PELVIS WITHOUT CONTRAST TECHNIQUE: Multidetector CT imaging of the abdomen and pelvis was performed following the standard protocol without IV contrast. RADIATION DOSE REDUCTION: This exam was performed according to the departmental dose-optimization program which includes automated exposure control, adjustment of  the mA and/or kV according to patient size and/or use of iterative reconstruction technique. COMPARISON:  11/29/2020 FINDINGS: Lower chest: Small left and trace right pleural effusions with associated compressive atelectasis. Heart size within normal limits. Relative hypoattenuation of the cardiac blood pool indicative of anemia. Moderate-large hiatal hernia. Hepatobiliary: Shrunken, nodular appearance of the liver compatible with cirrhosis. No definite liver lesion identified on unenhanced images. Moderately distended gallbladder. No hyperdense gallstone. Pancreas: Unremarkable. No pancreatic ductal dilatation or surrounding inflammatory changes. Spleen: Normal in size without focal abnormality. Adrenals/Urinary Tract: Adrenal glands are unremarkable. Kidneys are normal, without renal calculi, solid lesion, or hydronephrosis. Bladder is unremarkable. Stomach/Bowel: Moderate-large hiatal hernia containing the majority of the stomach within the chest. Appendix not visualized, and may be surgically absent. Left-sided colonic diverticulosis. No evidence of bowel wall thickening, distention, or inflammatory changes. Vascular/Lymphatic: Aortic atherosclerosis. No enlarged abdominal or pelvic lymph nodes. Reproductive: Status post hysterectomy. No adnexal masses. Other: Moderate volume ascites. No organized abdominopelvic fluid collections. No pneumoperitoneum. Musculoskeletal: Anasarca.  No acute bony findings. IMPRESSION: 1. Cirrhotic liver with moderate volume ascites. 2. Small left and trace right pleural effusions with associated compressive atelectasis. 3. Moderate-large hiatal hernia containing the majority of the stomach within the chest. 4. Left-sided colonic diverticulosis without evidence for acute diverticulitis. 5. Anasarca. 6. Aortic atherosclerosis (ICD10-I70.0). 7. Relative hypoattenuation of the cardiac blood pool indicative of anemia. Electronically Signed   By: Davina Poke D.O.   On: 02/01/2022  13:40   DG Chest 2 View  Result Date: 02/01/2022 CLINICAL DATA:  Weakness with vomiting.  Suspected  sepsis. EXAM: CHEST - 2 VIEW COMPARISON:  Radiographs 01/11/2022 and 01/09/2022.  CT 01/11/2022. FINDINGS: Right IJ hemodialysis catheter is unchanged, projecting to the level of the upper right atrium. The heart size and mediastinal contours are stable with a large hiatal hernia. Unchanged small bilateral pleural effusions with associated left greater than right basilar atelectasis. No edema or confluent airspace opacity identified. There is no pneumothorax. The bones appear unchanged. IMPRESSION: Stable chest with small bilateral pleural effusions and bibasilar atelectasis. Large hiatal hernia. No acute findings. Electronically Signed   By: Richardean Sale M.D.   On: 02/01/2022 12:36    Assessment and Plan  [name] is an 81 year old female with history of liver cirrhosis, end-stage renal disease on hemodialysis Tuesday Thursday Saturday, hypertension, who presents emergency department for chief concerns of weakness and tremors.  CT abdomen pelvis without contrast: Was read as cirrhotic liver with moderate volume ascites.  Small left and trace right pleural effusion with associated compressive atelectasis.  Moderate large hiatal hernia containing majority of the stomach within the chest.  Aortic atherosclerosis.  Anasarca.  Patient was hypotension was placed in the ICU currently on IV levophed. Lactic acid was elevated panel on empiric IV antibiotics. No fever.  Severe sepsis ([place]) - Etiology unclear -  procalcitonin 3.8, -- blood cultures x2 negative --peritoneal fluid--neg for peritonitis - on broad-spectrum antibiotic with vancomycin, cefepime, metronidazole--de-escalate to cefepime--if all cultures neg then will d/c abxs -  wean off Levophed   Anemia in ESRD (end-stage renal disease) ([place]) - came in hgb 7.4--6.6--1 unit BT today   Anasarca ascites  Cirrhosis of liver (from radiologic  studies)--pt to see Dr Allen [name] as outpt H/o GI bleed with recent extensive w/u without obvious surce of bleeding H/o hemorrhoids - IR consulted for paracentesis - Albumin as needed ordered  Acute on Chronic Diastolic CHF --2D echo on 12/08/2021 showed EF of 55-60% with grade 2 diastolic dysfunction.  Patient has 1+ leg edema, but no JVD, no pulmonary edema on chest x-ray.   --will reusme home torsemide once bp stable -Fluid management per renal by dialysis   ESRD on dialysis [facility])  - Nephrology has been consulted, Dr. Candiss Norse - Strict I's and O's --UF with HD   H/o HTN (hypertension) Hypotension --cont midodrine and wean levophed as tolerated   OSA on CPAP - CPAP nightly ordered   Paroxysmal atrial fibrillation ([place]) - Patient is not on anticoagulation due to extensive discussion in the past as she is concerned for bleeding - Amiodarone not continued on admission due to hypotension -  Currently NSR  Morbid obesity ([place]) - This complicates overall care and prognosis.   H/o recent PE --not on any anticoagulation due to anemia requiring freq BT   Poor prognosis. Pt followed by Palliative care inpatient and out pt  DVT prophylaxis: SCD (gi bleed) Code Status: DNR Diet: Renal diet Family Communication: Updated daughter-in-law at bedside with patient's permission   Level of care: Stepdown Status is: Inpatient Remains inpatient appropriate because: hypotension    TOTAL TIME TAKING CARE OF THIS PATIENT: 35 minutes.  >50% time spent on counselling and coordination of care  Note: This dictation was prepared with Dragon dictation along with smaller phrase technology. Any transcriptional errors that result from this process are unintentional.  Fritzi Mandes M.D    [facility]   CC: Primary care physician; Einar Pheasant, MD

## 2022-02-02 NOTE — Assessment & Plan Note (Signed)
Continues on lactulose.

## 2022-02-02 NOTE — Assessment & Plan Note (Signed)
Off crestor due to cirrhosis.  Follow.  

## 2022-02-02 NOTE — Consult Note (Signed)
NAME:  April [name], MRN:  [id number], DOB:  Aug 09, 1940, LOS: 1 ADMISSION DATE:  02/01/2022, CONSULTATION DATE:  02/02/22 REFERRING MD:  Morton Amy, NP, CHIEF COMPLAINT:  Weakness, nausea & vomiting   History of Present Illness:  81 yo F presenting to [facility] ED on 02/01/22 from [facility] for evaluation of weakness, emesis & tremors. She reports nausea and an episode of non bloody bilious emesis as well as weakness & fatigue.  She also confirms increased BLE swelling and weight gain. She confirms completing her iHD session on 10/3.  She admits to SOB, abdominal distention, BLE edema but reports these issues have been ongoing since March. She denies fever, but reports always being cold. She denies productive cough, chest pain, abdominal pain, diarrhea, syncope, falls, blurry vision. She denies tobacco [place], recreational drug use, ETOH use.  And confirms taking all medication as prescribed including her rifaximin & lactulose.  ED course: Upon arrival patient was hypotensive and sepsis protocol initiated. Diagnostic paracentesis performed and lab work sent. TRH consulted for admission Medications given: NS bolus 500 mL, cefepime/vancomycin/flagyl Initial Vitals: afebrile 98.8, mild tachypnea 20, HR - 89, hypotensive 87/50 & 94% on RA Significant labs: (Labs/ Imaging personally reviewed) I, Domingo Pulse [place], AGACNP-BC, personally viewed and interpreted this ECG. EKG Interpretation: Date: 02/01/22, EKG Time: 11:52, Rate: 85, Rhythm: LBBB, QRS Axis:  normal, Intervals: 1st degree HB, LBBB & prolonged QTc, ST/T Wave abnormalities: non specific T wave inversions, Narrative Interpretation: 1st degree HB & LBBB (unchanged from previous) Chemistry: Na+: 130, K+: 3.6, BUN/Cr.: 49/ 4.22, Serum CO2/ AG: 21/10, AST: 67 Hematology: WBC: 6.8, Hgb: 7.7, plt: 126  BNP: pending, Lactic/ PCT: 6.6 > 5.7 > 6.0/ 3.85, COVID-19 & Influenza A/B: negative  CXR 02/01/22: small bilateral effusions & bibasilar  atelectasis. Large hiatal hernia. CT abdomen/pelvis wo contrast 02/01/22: Cirrhotic liver with moderate volume ascites. Small left and trace right pleural effusions with associated compressive atelectasis. Moderate-large hiatal hernia containing the majority of the stomach within the chest. Left-sided colonic diverticulosis without evidence for acute diverticulitis. Anasarca.  Patient continues to have marginal blood pressures with SBP 100-90's and MAP in the 50's. She received a total of 750 mL of fluid due to anasarca & albumin post paracentesis. PCCM consulted for assistance in management and monitoring due to suspected sepsis with shock requiring peripheral vasopressor support.  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 Cirrhosis HFpEF CAD s/p PCI PAF (not on [place] due to GIB) Symptomatic anemia with recurrent GIB HTN HLD OSA  Significant Hospital Events: Including procedures, antibiotic start and stop dates in addition to other pertinent events   02/01/22: Admit to SDU with suspected sepsis s/t suspected SBP requiring peripheral vasopressor support.  Interim History / Subjective:  Patient alert and oriented with some mild dyspnea noted during conversation. Marginal BP with SBP >90 but MAP in the 50's on peripheral levophed. No current complaints at this time.  Objective   Blood pressure (!) 94/46, pulse 75, temperature 98.1 F (36.7 C), resp. rate 14, height '4\' 9"'$  (1.448 m), weight 83.2 kg, last menstrual period 05/01/1972, SpO2 96 %.       No intake or output data in the 24 hours ending 02/02/22 0344 Filed Weights   02/01/22 1137 02/01/22 2250  Weight: 77.1 kg 83.2 kg    Examination: General: Adult female, acutely ill, lying in bed, NAD HEENT: MM pink/moist, anicteric, atraumatic, neck supple Neuro: A&O x 4, able to follow commands, PERRL +3, MAE CV:  s1s2 RRR, LBBB on monitor, no r/m/g Pulm: Regular, mild dyspnea on RA, breath sounds clear-BUL & diminished-BLL GI: soft,  distended with hiatal hernia, mild tenderness over para site, bs x 4 Skin:  no rashes/lesions noted Extremities: warm/dry, pulses + 2 R/P, +4 edema noted BLE  Resolved Hospital Problem list     Assessment & Plan:  Suspected Sepsis with shock due to suspected SBP Initial interventions/workup included: 750 mL of NS/LR & Cefepime/ Vancomycin/ Metronidazole. Patient also received a dose of midodrine and albumin. - IR has already been consulted by [facility] for paracentesis - continue midodrine, agree with albumin PRN post iHD/paracentesis - Supplemental oxygen as needed, to maintain SpO2 > 90% - f/u cultures, trend lactic (PCT not as helpful in ESRD) - Daily CBC, monitor WBC/ fever curve - IV antibiotics: cefepime, flagyl & vancomycin - Continue peripheral vasopressors to maintain MAP< 60: norepinephrine - Strict I/O's: alert provider if UOP < 0.5 mL/kg/hr - f/u cortisol level, consider SDS PRN  Chronic HFpEF  PAF HTN BNP: pending  - f/u BNP - consider restarting amiodarone, lopressor IV PRN for sustained tachycardia - hold anti-hypertensive medications at this time due to hypotension - Continuous cardiac monitoring  - Daily weights to assess volume status -1200 Fluid restriction   OSA - continue outpatient CPAP nightly  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 - nephrology consulted - daily BMP, replace electrolytes PRN  Best Practice (right click and "Reselect all SmartList Selections" daily)  Diet/type: Regular consistency (see orders) DVT prophylaxis: prophylactic heparin  GI prophylaxis: PPI Lines: N/A Foley:  N/A Code Status:  DNR Last date of multidisciplinary goals of care discussion [per primary]  Labs   CBC: Recent Labs  Lab 01/30/22 0000 02/01/22 1153  WBC 9.5 6.8  NEUTROABS 6.20 5.8  HGB 7.4* 7.7*  HCT 23* 24.0*  MCV  --  98.0  PLT 102* 126*    Basic Metabolic Panel: Recent Labs  Lab 02/01/22 1152  NA 130*  K 3.6  CL 99  CO2 21*  GLUCOSE 169*  BUN 49*  CREATININE  4.22*  CALCIUM 8.2*   GFR: Estimated Creatinine Clearance: 9 mL/min (A) (by C-G formula based on SCr of 4.22 mg/dL (H)). Recent Labs  Lab 01/30/22 0000 02/01/22 1152 02/01/22 1153 02/01/22 1442 02/01/22 1723  PROCALCITON  --   --   --   --  3.85  WBC 9.5  --  6.8  --   --   LATICACIDVEN  --  6.6*  --  5.7* 6.0*    Liver Function Tests: Recent Labs  Lab 02/01/22 1152  AST 67*  ALT 38  ALKPHOS 84  BILITOT 2.0*  PROT 5.0*  ALBUMIN 2.2*   No results for input(s): "LIPASE", "AMYLASE" in the last 168 hours. Recent Labs  Lab 02/01/22 1723  AMMONIA 30    ABG    Component Value Date/Time   PHART 7.6 (H) 10/18/2021 0313   PCO2ART 47 10/18/2021 0313   PO2ART 78 (L) 10/18/2021 0313   HCO3 46.1 (H) 10/18/2021 0313   TCO2 25 03/25/2016 1927   O2SAT 97.4 10/18/2021 0313     Coagulation Profile: Recent Labs  Lab 02/01/22 1152  INR 1.2    Cardiac Enzymes: No results for input(s): "CKTOTAL", "CKMB", "CKMBINDEX", "TROPONINI" in the last 168 hours.  HbA1C: Hgb A1c MFr Bld  Date/Time Value Ref Range Status  09/21/2021 01:33 PM 6.3 4.6 - 6.5 % Final    Comment:    Glycemic Control Guidelines for People with Diabetes:Non  Diabetic:  <6%Goal of Therapy: <7%Additional Action Suggested:  >8%   01/20/2021 09:20 AM 6.2 4.6 - 6.5 % Final    Comment:    Glycemic Control Guidelines for People with Diabetes:Non Diabetic:  <6%Goal of Therapy: <7%Additional Action Suggested:  >8%     CBG: No results for input(s): "GLUCAP" in the last 168 hours.  Review of Systems: Positives in BOLD  Gen: Denies fever, chills, weight change, fatigue, night sweats HEENT: Denies blurred vision, double vision, hearing loss, tinnitus, sinus congestion, rhinorrhea, sore throat, neck stiffness, dysphagia PULM: Denies shortness of breath, cough, sputum production, hemoptysis, wheezing CV: Denies chest pain, edema, orthopnea, paroxysmal nocturnal dyspnea, palpitations GI: Denies abdominal pain, nausea,  vomiting, diarrhea, hematochezia, melena, constipation, change in bowel habits GU: Denies dysuria, hematuria, polyuria, oliguria, urethral discharge Endocrine: Denies hot or cold intolerance, polyuria, polyphagia or appetite change Derm: Denies rash, dry skin, scaling or peeling skin change Heme: Denies easy bruising, bleeding, bleeding gums Neuro: Denies headache, numbness, weakness, slurred speech, loss of memory or consciousness  Past Medical History:  She,  has a past medical history of Anemia, CAD S/P PCI pRCA Promus DES 3.5 x 16 (4.1 mm), ostRPDA Promus DES 2.5 x 12 (2.7 mm) (03/25/2016), CAD S/P percutaneous coronary angioplasty, CHF (congestive heart failure) ([place]), CKD (chronic kidney disease), stage III ([place]), Diabetes mellitus without complication ([place]), Diastolic dysfunction, Diverticulitis, Dyspnea, Endometriosis, Family history of adverse reaction to anesthesia, GERD (gastroesophageal reflux disease), GI bleed, Hiatal hernia, History of colon polyps (08/1994), History of Migraines, Hypercholesterolemia, Hypertension, LBBB (left bundle branch block), Osteoporosis, PAF (paroxysmal atrial fibrillation) ([place]), Rheumatic fever, Sleep apnea, Spasmodic dysphonia, ST elevation myocardial infarction (STEMI) of inferolateral wall, initial episode of care ([place]) (03/25/2016), ST elevation myocardial infarction (STEMI) of inferolateral wall, initial episode of care ([place]) (03/25/2016), and Urine incontinence.   Surgical History:   Past Surgical History:  Procedure Laterality Date   ABDOMINAL HYSTERECTOMY     ovaries not removed   APPENDECTOMY     was removed during hysterectomy   Breast biopsies     x2   BREAST EXCISIONAL BIOPSY Bilateral "years ago"   neg   BREAST SURGERY Bilateral    BROW LIFT Bilateral 08/15/2019   Procedure: BLEPHAROPLASTY UPPER EYELID; W/EXCESS SKIN BLEPHAROPTOSIS REPAIR; RESECT EX;  Surgeon: Karle Starch, MD;  Location: [facility];  Service: Ophthalmology;   Laterality: Bilateral;  sleep apnea   CARDIAC CATHETERIZATION  2011   moderate 40% RCA disease   CARDIAC CATHETERIZATION  01/2010   Dr. Gollan'@[facility]'$ : Only noted 40% RCA   CARDIAC CATHETERIZATION N/A 03/25/2016   Procedure: Left Heart Cath and Coronary Angiography;  Surgeon: 04/07/2016, MD;  Location: [place] [facility];  Service: Cardiovascular;  Laterality: N/A;   CARDIAC CATHETERIZATION N/A 03/25/2016   Procedure: Coronary Stent Intervention;  Surgeon: 04/07/2016, MD;  Location: [facility];  Service: Cardiovascular;  Laterality: N/A;   COLONOSCOPY  2013   COLONOSCOPY WITH PROPOFOL N/A 07/11/2021   Procedure: COLONOSCOPY WITH PROPOFOL;  Surgeon: 07/24/2021, MD;  Location: [facility] ENDOSCOPY;  Service: Endoscopy;  Laterality: N/A;   DIALYSIS/PERMA CATHETER INSERTION N/A 12/15/2021   Procedure: DIALYSIS/PERMA CATHETER INSERTION;  Surgeon: 12/28/2021, MD;  Location: [place] [facility];  Service: Cardiovascular;  Laterality: N/A;   ESOPHAGOGASTRODUODENOSCOPY (EGD) WITH PROPOFOL N/A 03/17/2015   Procedure: ESOPHAGOGASTRODUODENOSCOPY (EGD) WITH PROPOFOL;  Surgeon: 03/30/2015, MD;  Location: [facility] ENDOSCOPY;  Service: Gastroenterology;  Laterality: N/A;   ESOPHAGOGASTRODUODENOSCOPY (EGD) WITH  PROPOFOL N/A 07/10/2021   Procedure: ESOPHAGOGASTRODUODENOSCOPY (EGD) WITH PROPOFOL;  Surgeon: Lin Landsman, MD;  Location: [facility] ENDOSCOPY;  Service: Gastroenterology;  Laterality: N/A;   GIVENS CAPSULE STUDY N/A 07/11/2021   Procedure: GIVENS CAPSULE STUDY;  Surgeon: Lucilla Lame, MD;  Location: [facility] ENDOSCOPY;  Service: Endoscopy;  Laterality: N/A;   NM MYOVIEW ([place] HX)  07/2015   No evidence ischemia or infarction. EF 66%. Low risk   TEMPORARY DIALYSIS CATHETER N/A 12/08/2021   Procedure: TEMPORARY DIALYSIS CATHETER;  Surgeon: Algernon Huxley, MD;  Location: [place] [facility];  Service: Cardiovascular;  Laterality: N/A;   TONSILLECTOMY     TRANSTHORACIC ECHOCARDIOGRAM   10/2013   Normal LV size and function. EF 55-65%. GR 1 DD. Otherwise normal.     Social History:   reports that she has never smoked. She has never used smokeless tobacco. She reports that she does not drink alcohol and does not use drugs.   Family History:  Her family history includes Arthritis in her mother; Breast cancer in an other family member; Heart attack in her daughter, father, and sister; Heart disease in her father; Hypertension in her mother; Osteoporosis in her mother; Stroke in her brother, daughter, and mother. There is no history of Colon cancer.   Allergies Allergies  Allergen Reactions   Penicillins Other (See Comments)    Okay to take amoxicillin/(pt does not recall what the reaction to penicillin was (33-56 years old)    Sulfa Antibiotics Rash     Home Medications  Prior to Admission medications   Medication Sig Start Date End Date Taking? Authorizing Provider  acetaminophen (TYLENOL) 325 MG tablet Take 650 mg by mouth every 4 (four) hours as needed for mild pain or fever.   Yes [provider]  Amino Acids-Protein Hydrolys (FEEDING SUPPLEMENT, PRO-STAT SUGAR FREE 64,) LIQD Take 30 mLs by mouth 3 (three) times daily with meals.   Yes [provider]  amiodarone (PACERONE) 200 MG tablet Take 1 tablet (200 mg total) by mouth daily. 12/20/21  Yes Nolberto Hanlon, MD  docusate sodium (STOOL SOFTENER) 100 MG capsule Take 1 capsule (100 mg total) by mouth 2 (two) times daily. 12/19/21  Yes Nolberto Hanlon, MD  famotidine (PEPCID) 20 MG tablet Take 20 mg by mouth 2 (two) times daily.   Yes [provider]  lactulose (CHRONULAC) 10 GM/15ML solution Take 30 mLs (20 g total) by mouth 2 (two) times daily. 12/19/21  Yes Nolberto Hanlon, MD  rifaximin (XIFAXAN) 200 MG tablet Take 1 tablet (200 mg total) by mouth 2 (two) times daily. 12/19/21  Yes Nolberto Hanlon, MD  torsemide 40 MG TABS Take 40 mg by mouth 3 (three) times a week. 12/21/21  Yes Nolberto Hanlon, MD   albuterol (VENTOLIN HFA) 108 (90 Base) MCG/ACT inhaler Inhale 2 puffs into the lungs every 6 (six) hours as needed for wheezing or shortness of breath. 01/11/22   Shelly Coss, MD  diclofenac Sodium (VOLTAREN) 1 % GEL Apply 2 g topically 3 (three) times daily as needed (pain in legs and elbows). 12/19/21   Nolberto Hanlon, MD  ipratropium-albuterol (DUONEB) 0.5-2.5 (3) MG/3ML SOLN Take 3 mLs by nebulization every 6 (six) hours as needed. 01/11/22   Shelly Coss, MD  lidocaine (LIDODERM) 5 % Place 2 patches onto the skin daily. Remove & Discard patch within 12 hours or as directed by MD Patient taking differently: Place 1 patch onto the skin daily. Remove & Discard patch within 12 hours PRN or  as directed by MD 12/19/21   Nolberto Hanlon, MD  Multiple Vitamins-Minerals (MULTIVITAMIN WITH MINERALS) tablet Take 1 tablet by mouth daily.    [provider]  nitroGLYCERIN (NITROSTAT) 0.4 MG SL tablet Place 1 tablet (0.4 mg total) under the tongue every 5 (five) minutes as needed for chest pain. 12/19/21   Nolberto Hanlon, MD     Critical care time: 15 minutes       Venetia Night, AGACNP-BC Acute Care Nurse Practitioner [facility]   [phone] / [phone] Please see Amion for pager details.

## 2022-02-02 NOTE — Assessment & Plan Note (Signed)
Dialysis TTS.  Missed Saturday due to nausea and vomiting.  Due tomorrow.  Taking torsemide.

## 2022-02-02 NOTE — Assessment & Plan Note (Signed)
Off anticoagulation due to concern regarding GI bleed.  Colonoscopy 06/2021 - tubular adenoma, diverticulosis and non bleeding internal hemorrhoid.  EGD negative H. Pylori, mildly erythematous gastric mucosa with no obvious bleeding.  Bleeding noted now. Intermittent.  Feels related to hemorrhoids.  Discussed possible banding.  Will contact surgery for possible evaluation - for banding.  Would not tolerate surgery.

## 2022-02-02 NOTE — Assessment & Plan Note (Signed)
Afib.  Off eliquis.

## 2022-02-02 NOTE — Assessment & Plan Note (Signed)
Continues on protonix.  ?

## 2022-02-02 NOTE — Assessment & Plan Note (Signed)
Continues dialysis and torsemide.  Missed dialysis Saturday.  Due for dialysis tomorrow.

## 2022-02-02 NOTE — Assessment & Plan Note (Signed)
CBC just checked today at [facility].  Obtain results.

## 2022-02-02 NOTE — Assessment & Plan Note (Signed)
Off crestor due to cirrhosis.

## 2022-02-02 NOTE — Assessment & Plan Note (Signed)
Continue cpap.  

## 2022-02-02 NOTE — Progress Notes (Signed)
[facility]  ROUNDING NOTE   Subjective:   April [name] is a 81 year old female with past medical history including liver cirrhosis, anemia, diverticulitis, hypertension, CHF, CAD, and end stage renal disease on hemodialysis. She presents to the emergency department with weakness and vomiting. She has been admitted for Lactic acidosis [E87.20] Abdominal distension [R14.0] Severe sepsis (HCC) [A41.9, R65.20] Hypotension, unspecified hypotension type [I95.9] Cirrhosis of liver with ascites, unspecified hepatic cirrhosis type ([place]) [K74.60, R18.8] Other hypervolemia [E87.79]  This patient is known to our clinic and received outpatient dialysis treatments at [facility] on a TTS schedule, supervised by Dr Candiss Norse. She was recently started on dialysis during her last admission due to fluid overload and declining renal function. She states she felt well the day prior to admission. When she awoke yesterday, she felt weak with tremors. When she tries to move, change positions, she becomes nauseous. She states she has had recent dark stools. No family at bedside. Room air.  Labs on ED arrival include sodium 130, potassium 3.6, BUN 49, Creatinine 4.22 with GFR 10. Lactic acid 6.6 and Hgb 7.7.  We have been consulted to manage dialysis needs during this admission.   Objective:  Vital signs in last 24 hours:  Temp:  [97.6 F (36.4 C)-98.8 F (37.1 C)] 97.7 F (36.5 C) (10/05 1045) Pulse Rate:  [69-90] 74 (10/05 1045) Resp:  [13-24] 18 (10/05 1045) BP: (82-133)/(33-88) 119/46 (10/05 1045) SpO2:  [94 %-100 %] 98 % (10/05 1045) Weight:  [77.1 kg-83.2 kg] 81.1 kg (10/05 0825)  Weight change:  Filed Weights   02/01/22 1137 02/01/22 2250 02/02/22 0825  Weight: 77.1 kg 83.2 kg 81.1 kg    Intake/Output: I/O last 3 completed shifts: In: 178.7 [I.V.:28.7; IV Piggyback:150] Out: -    Intake/Output this shift:  Total I/O In: 612.4 [I.V.:77.4; Blood:350; IV Piggyback:185] Out: 0    Physical Exam: General: NAD, ill appearing, frail  Head: Normocephalic, atraumatic. Moist oral mucosal membranes  Eyes: Anicteric  Lungs:  Diminished in bases, normal effort, room air  Heart: Regular rate and rhythm  Abdomen:  Soft, nontender  Extremities:  3+  peripheral edema.  Neurologic: Nonfocal, moving all four extremities  Skin: No lesions  Access: Rt chest permcath    Basic Metabolic Panel: Recent Labs  Lab 02/01/22 1152 02/02/22 0420  NA 130* 133*  K 3.6 3.6  CL 99 100  CO2 21* 18*  GLUCOSE 169* 145*  BUN 49* 54*  CREATININE 4.22* 4.60*  CALCIUM 8.2* 8.3*  MG  --  1.8  PHOS  --  4.2    Liver Function Tests: Recent Labs  Lab 02/01/22 1152 02/02/22 0420  AST 67* 78*  ALT 38 38  ALKPHOS 84 63  BILITOT 2.0* 2.1*  PROT 5.0* 4.8*  ALBUMIN 2.2* 2.2*   No results for input(s): "LIPASE", "AMYLASE" in the last 168 hours. Recent Labs  Lab 02/01/22 1723  AMMONIA 30    CBC: Recent Labs  Lab 01/30/22 0000 02/01/22 1153 02/02/22 0420  WBC 9.5 6.8 13.4*  NEUTROABS 6.20 5.8  --   HGB 7.4* 7.7* 6.6*  HCT 23* 24.0* 19.9*  MCV  --  98.0 95.7  PLT 102* 126* 101*    Cardiac Enzymes: No results for input(s): "CKTOTAL", "CKMB", "CKMBINDEX", "TROPONINI" in the last 168 hours.  BNP: Invalid input(s): "POCBNP"  CBG: No results for input(s): "GLUCAP" in the last 168 hours.  Microbiology: Results for orders placed or performed during the hospital encounter  of 02/01/22  Culture, blood (Routine x 2)     Status: None (Preliminary result)   Collection Time: 02/01/22 11:53 AM   Specimen: BLOOD  Result Value Ref Range Status   Specimen Description BLOOD LEFT ANTECUBITAL  Final   Special Requests   Final    BOTTLES DRAWN AEROBIC AND ANAEROBIC Blood Culture results may not be optimal due to an excessive volume of blood received in culture bottles   Culture   Final    NO GROWTH < 24 HOURS Performed at [facility], [address], [place],  [place] [address]    Report Status PENDING  Incomplete  Culture, blood (Routine x 2)     Status: None (Preliminary result)   Collection Time: 02/01/22  1:16 PM   Specimen: BLOOD  Result Value Ref Range Status   Specimen Description BLOOD BLOOD LEFT FOREARM  Final   Special Requests BLOOD LEFT ARM Blood Culture adequate volume  Final   Culture   Final    NO GROWTH < 24 HOURS Performed at [facility], [address] [place], [place] [address]    Report Status PENDING  Incomplete  Resp Panel by RT-PCR (Flu A&B, Covid) Anterior Nasal Swab     Status: None   Collection Time: 02/01/22  1:17 PM   Specimen: Anterior Nasal Swab  Result Value Ref Range Status   SARS Coronavirus 2 by RT PCR NEGATIVE NEGATIVE Final    Comment: (NOTE) SARS-CoV-2 target nucleic acids are NOT DETECTED.  The SARS-CoV-2 RNA is generally detectable in upper respiratory specimens during the acute phase of infection. The lowest concentration of SARS-CoV-2 viral copies this assay can detect is 138 copies/mL. A negative result does not preclude SARS-Cov-2 infection and should not be used as the sole basis for treatment or other patient management decisions. A negative result may occur with  improper specimen collection/handling, submission of specimen other than nasopharyngeal swab, presence of viral mutation(s) within the areas targeted by this assay, and inadequate number of viral copies(<138 copies/mL). A negative result must be combined with clinical observations, patient history, and epidemiological information. The expected result is Negative.  Fact Sheet for Patients:  EntrepreneurPulse.com.au  Fact Sheet for Healthcare Providers:  IncredibleEmployment.be  This test is no t yet approved or cleared by the [place] FDA and  has been authorized for detection and/or diagnosis of SARS-CoV-2 by FDA under an Emergency Use Authorization (EUA). This EUA will remain  in  effect (meaning this test can be used) for the duration of the COVID-19 declaration under Section 564(b)(1) of the Act, 21 U.S.C.section 360bbb-3(b)(1), unless the authorization is terminated  or revoked sooner.       Influenza A by PCR NEGATIVE NEGATIVE Final   Influenza B by PCR NEGATIVE NEGATIVE Final    Comment: (NOTE) The Xpert Xpress SARS-CoV-2/FLU/RSV plus assay is intended as an aid in the diagnosis of influenza from Nasopharyngeal swab specimens and should not be used as a sole basis for treatment. Nasal washings and aspirates are unacceptable for Xpert Xpress SARS-CoV-2/FLU/RSV testing.  Fact Sheet for Patients: EntrepreneurPulse.com.au  Fact Sheet for Healthcare Providers: IncredibleEmployment.be  This test is not yet approved or cleared by the [place] FDA and has been authorized for detection and/or diagnosis of SARS-CoV-2 by FDA under an Emergency Use Authorization (EUA). This EUA will remain in effect (meaning this test can be used) for the duration of the COVID-19 declaration under Section 564(b)(1) of the Act, 21 U.S.C. section 360bbb-3(b)(1), unless  the authorization is terminated or revoked.  Performed at [facility], [address] [place], [place] [address]   Peritoneal fluid culture w Gram Stain     Status: None (Preliminary result)   Collection Time: 02/01/22  4:59 PM   Specimen: Peritoneal Washings; Peritoneal Fluid  Result Value Ref Range Status   Specimen Description   Final    PERITONEAL Performed at [facility], [address] [place], [place] [address]    Special Requests   Final    NONE Performed at [facility], [place], [place] [address]    Gram Stain NO ORGANISMS SEEN NO WBC SEEN   Final   Culture   Final    NO GROWTH < 12 HOURS Performed at [facility], [place] [address] [place], [place] [address]    Report Status PENDING  Incomplete   MRSA Next Gen by PCR, Nasal     Status: None   Collection Time: 02/01/22 10:53 PM   Specimen: Nasal Mucosa; Nasal Swab  Result Value Ref Range Status   MRSA by PCR Next Gen NOT DETECTED NOT DETECTED Final    Comment: (NOTE) The GeneXpert MRSA Assay (FDA approved for NASAL specimens only), is one component of a comprehensive MRSA colonization surveillance program. It is not intended to diagnose MRSA infection nor to guide or monitor treatment for MRSA infections. Test performance is not FDA approved in patients less than 45 years old. Performed at [facility], [address] [place], [place] [address]     Coagulation Studies: Recent Labs    02/01/22 1152  LABPROT 15.5*  INR 1.2    Urinalysis: No results for input(s): "COLORURINE", "LABSPEC", "PHURINE", "GLUCOSEU", "HGBUR", "BILIRUBINUR", "KETONESUR", "PROTEINUR", "UROBILINOGEN", "NITRITE", "LEUKOCYTESUR" in the last 72 hours.  Invalid input(s): "APPERANCEUR"    Imaging: CT Abdomen Pelvis Wo Contrast  Result Date: 02/01/2022 CLINICAL DATA:  Abdominal pain, acute, nonlocalized EXAM: CT ABDOMEN AND PELVIS WITHOUT CONTRAST TECHNIQUE: Multidetector CT imaging of the abdomen and pelvis was performed following the standard protocol without IV contrast. RADIATION DOSE REDUCTION: This exam was performed according to the departmental dose-optimization program which includes automated exposure control, adjustment of the mA and/or kV according to patient size and/or use of iterative reconstruction technique. COMPARISON:  11/29/2020 FINDINGS: Lower chest: Small left and trace right pleural effusions with associated compressive atelectasis. Heart size within normal limits. Relative hypoattenuation of the cardiac blood pool indicative of anemia. Moderate-large hiatal hernia. Hepatobiliary: Shrunken, nodular appearance of the liver compatible with cirrhosis. No definite liver lesion identified on unenhanced images. Moderately distended  gallbladder. No hyperdense gallstone. Pancreas: Unremarkable. No pancreatic ductal dilatation or surrounding inflammatory changes. Spleen: Normal in size without focal abnormality. Adrenals/Urinary Tract: Adrenal glands are unremarkable. Kidneys are normal, without renal calculi, solid lesion, or hydronephrosis. Bladder is unremarkable. Stomach/Bowel: Moderate-large hiatal hernia containing the majority of the stomach within the chest. Appendix not visualized, and may be surgically absent. Left-sided colonic diverticulosis. No evidence of bowel wall thickening, distention, or inflammatory changes. Vascular/Lymphatic: Aortic atherosclerosis. No enlarged abdominal or pelvic lymph nodes. Reproductive: Status post hysterectomy. No adnexal masses. Other: Moderate volume ascites. No organized abdominopelvic fluid collections. No pneumoperitoneum. Musculoskeletal: Anasarca.  No acute bony findings. IMPRESSION: 1. Cirrhotic liver with moderate volume ascites. 2. Small left and trace right pleural effusions with associated compressive atelectasis. 3. Moderate-large hiatal hernia containing the majority of the stomach within the chest. 4. Left-sided colonic diverticulosis without evidence for acute diverticulitis. 5. Anasarca.  6. Aortic atherosclerosis (ICD10-I70.0). 7. Relative hypoattenuation of the cardiac blood pool indicative of anemia. Electronically Signed   By: Davina Poke D.O.   On: 02/01/2022 13:40   DG Chest 2 View  Result Date: 02/01/2022 CLINICAL DATA:  Weakness with vomiting.  Suspected sepsis. EXAM: CHEST - 2 VIEW COMPARISON:  Radiographs 01/11/2022 and 01/09/2022.  CT 01/11/2022. FINDINGS: Right IJ hemodialysis catheter is unchanged, projecting to the level of the upper right atrium. The heart size and mediastinal contours are stable with a large hiatal hernia. Unchanged small bilateral pleural effusions with associated left greater than right basilar atelectasis. No edema or confluent airspace  opacity identified. There is no pneumothorax. The bones appear unchanged. IMPRESSION: Stable chest with small bilateral pleural effusions and bibasilar atelectasis. Large hiatal hernia. No acute findings. Electronically Signed   By: Richardean Sale M.D.   On: 02/01/2022 12:36     Medications:    sodium chloride Stopped (02/02/22 0400)   albumin human     albumin human     ceFEPime (MAXIPIME) IV     metronidazole 100 mL/hr at 02/02/22 0751   norepinephrine (LEVOPHED) Adult infusion 3 mcg/min (02/02/22 0751)   vancomycin      sodium chloride   Intravenous Once   Chlorhexidine Gluconate Cloth  6 each Topical Q0600   Chlorhexidine Gluconate Cloth  6 each Topical Q0600   docusate sodium  100 mg Oral BID   famotidine  20 mg Oral Daily   feeding supplement (PRO-STAT SUGAR FREE 64)  30 mL Oral TID WC   heparin  5,000 Units Subcutaneous Q8H   lactulose  20 g Oral BID   lidocaine  1 patch Transdermal Q24H   midodrine  10 mg Oral TID WC   multivitamin with minerals  1 tablet Oral Daily   pantoprazole (PROTONIX) IV  40 mg Intravenous BID   rifaximin  200 mg Oral BID   acetaminophen **OR** acetaminophen, albumin human, diclofenac Sodium, heparin, ipratropium-albuterol, labetalol, nitroGLYCERIN, ondansetron **OR** ondansetron (ZOFRAN) IV, vancomycin  Assessment/ Plan:  [name] is a 81 y.o.  female with past medical history including liver cirrhosis, anemia, diverticulitis, hypertension, CHF, CAD, and end stage renal disease on hemodialysis. She presents to the emergency department with weakness and vomiting. She has been admitted for Lactic acidosis [E87.20] Abdominal distension [R14.0] Severe sepsis (HCC) [A41.9, R65.20] Hypotension, unspecified hypotension type [I95.9] Cirrhosis of liver with ascites, unspecified hepatic cirrhosis type ([place]) [K74.60, R18.8] Other hypervolemia [E87.79]  [facility]/TTS/right chest PermCath  End stage renal disease on hemodialysis. Will  maintain outpatient schedule during this admission. Receiving dialysis today, with UF 2-2.5L as tolerated. Can use Albumin during dialysis to help maintain blood pressure and optimize fluid removal. Next treatment scheduled for Saturday. May provide additional treatment tomorrow for fluid removal. Will monitor fluid status closely due to decreased hemoglobin and blood pressure, this will make achieving satisfactory fluid volume status difficult  2. Anemia of chronic kidney disease  Lab Results  Component Value Date   HGB 6.6 (L) 02/02/2022  Reports recent history of dark stools.  Hgb remains decreased. Will receive 1 unit blood transfusion with dialysis today.  3. 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 Will continue to monitor bone minerals during this admission  4. Hypotension due to suspected sepsis. Workup ongoing. Empirical antibiotics ordered  by primary team. Receiving Levo for blood pressure support.    LOS: 1 Rielly Corlett 10/5/202310:54 AM

## 2022-02-02 NOTE — Assessment & Plan Note (Signed)
Breathing stable.  Chronic sob.  

## 2022-02-02 NOTE — Assessment & Plan Note (Signed)
Low carb diet and exercise.  Follow met b and a1c.  

## 2022-02-02 NOTE — Assessment & Plan Note (Signed)
Not on anticoagulation due to anemia and concern for GI bleeding.  On amiodarone.  Follow.   

## 2022-02-03 ENCOUNTER — Inpatient Hospital Stay: Payer: Medicare Other

## 2022-02-03 DIAGNOSIS — E872 Acidosis, unspecified: Secondary | ICD-10-CM

## 2022-02-03 DIAGNOSIS — A419 Sepsis, unspecified organism: Secondary | ICD-10-CM | POA: Diagnosis not present

## 2022-02-03 DIAGNOSIS — R6520 Severe sepsis without septic shock: Secondary | ICD-10-CM | POA: Diagnosis not present

## 2022-02-03 DIAGNOSIS — I959 Hypotension, unspecified: Secondary | ICD-10-CM

## 2022-02-03 LAB — CBC
HCT: 24 % — ABNORMAL LOW (ref 36.0–46.0)
Hemoglobin: 8.1 g/dL — ABNORMAL LOW (ref 12.0–15.0)
MCH: 31 pg (ref 26.0–34.0)
MCHC: 33.8 g/dL (ref 30.0–36.0)
MCV: 92 fL (ref 80.0–100.0)
Platelets: 123 10*3/uL — ABNORMAL LOW (ref 150–400)
RBC: 2.61 MIL/uL — ABNORMAL LOW (ref 3.87–5.11)
RDW: 18.8 % — ABNORMAL HIGH (ref 11.5–15.5)
WBC: 14.9 10*3/uL — ABNORMAL HIGH (ref 4.0–10.5)
nRBC: 0.7 % — ABNORMAL HIGH (ref 0.0–0.2)

## 2022-02-03 LAB — TYPE AND SCREEN
ABO/RH(D): A POS
Antibody Screen: NEGATIVE
Unit division: 0

## 2022-02-03 LAB — COMPREHENSIVE METABOLIC PANEL
ALT: 42 U/L (ref 0–44)
AST: 82 U/L — ABNORMAL HIGH (ref 15–41)
Albumin: 2 g/dL — ABNORMAL LOW (ref 3.5–5.0)
Alkaline Phosphatase: 69 U/L (ref 38–126)
Anion gap: 8 (ref 5–15)
BUN: 35 mg/dL — ABNORMAL HIGH (ref 8–23)
CO2: 26 mmol/L (ref 22–32)
Calcium: 8.1 mg/dL — ABNORMAL LOW (ref 8.9–10.3)
Chloride: 99 mmol/L (ref 98–111)
Creatinine, Ser: 3.1 mg/dL — ABNORMAL HIGH (ref 0.44–1.00)
GFR, Estimated: 14 mL/min — ABNORMAL LOW (ref >60)
Glucose, Bld: 140 mg/dL — ABNORMAL HIGH (ref 70–99)
Potassium: 3 mmol/L — ABNORMAL LOW (ref 3.5–5.1)
Sodium: 133 mmol/L — ABNORMAL LOW (ref 135–145)
Total Bilirubin: 2.5 mg/dL — ABNORMAL HIGH (ref 0.3–1.2)
Total Protein: 4.6 g/dL — ABNORMAL LOW (ref 6.5–8.1)

## 2022-02-03 LAB — BPAM RBC
Blood Product Expiration Date: 202311022359
ISSUE DATE / TIME: 202310050854
Unit Type and Rh: 6200

## 2022-02-03 LAB — BODY FLUID CELL COUNT WITH DIFFERENTIAL
Eos, Fluid: 0 %
Lymphs, Fluid: 38 %
Monocyte-Macrophage-Serous Fluid: 36 %
Neutrophil Count, Fluid: 26 %
Total Nucleated Cell Count, Fluid: 53 cu mm

## 2022-02-03 LAB — PROCALCITONIN: Procalcitonin: 5.48 ng/mL

## 2022-02-03 LAB — PROTEIN, PLEURAL OR PERITONEAL FLUID: Total protein, fluid: <3 g/dL

## 2022-02-03 LAB — HEPATITIS B SURFACE ANTIBODY, QUANTITATIVE: Hep B S AB Quant (Post): <3.1 m[IU]/mL — ABNORMAL LOW (ref >9.9)

## 2022-02-03 LAB — MAGNESIUM: Magnesium: 1.6 mg/dL — ABNORMAL LOW (ref 1.7–2.4)

## 2022-02-03 LAB — PHOSPHORUS: Phosphorus: 2.9 mg/dL (ref 2.5–4.6)

## 2022-02-03 LAB — ALBUMIN, PLEURAL OR PERITONEAL FLUID: Albumin, Fluid: <1.5 g/dL

## 2022-02-03 MED ORDER — POTASSIUM CHLORIDE CRYS ER 20 MEQ PO TBCR
20.0000 meq | EXTENDED_RELEASE_TABLET | Freq: Once | ORAL | Status: AC
  Administered 2022-02-03: 20 meq via ORAL
  Filled 2022-02-03: qty 1

## 2022-02-03 MED ORDER — MAGNESIUM SULFATE IN D5W 1-5 GM/100ML-% IV SOLN
1.0000 g | Freq: Once | INTRAVENOUS | Status: AC
  Administered 2022-02-03: 1 g via INTRAVENOUS
  Filled 2022-02-03: qty 100

## 2022-02-03 NOTE — Progress Notes (Signed)
PT Cancellation Note  Patient Details Name: CHALET KERWIN MRN: [id number] DOB: [birth year]   Cancelled Treatment:    Reason Eval/Treat Not Completed: Medical issues which prohibited therapy (Order received, chart reviewed. Vitals outside of safe level for PT services at this time. MAP at 55 mmMg most of morning. Will continue to follow and attempt evaluation once medically pressures more appropriate.)  2:43 PM, 02/03/22 Etta Grandchild, PT, DPT Physical Therapist - [facility]  [phone] ([place])    [place] C 02/03/2022, 2:43 PM

## 2022-02-03 NOTE — Progress Notes (Signed)
[name] [place] NAME: April [name]    MR#:  [id number]  DATE OF BIRTH:  03-09-1941  SUBJECTIVE:   son at bedside. Patient came from [place] after she started having generalized weakness, tremors and vomiting Missed dialysis on last Saturday. Got dialysis on Tuesday. Found to have hemoglobin of 6.6. Received one unit blood transfusion yday. Overall breathing stable. Has had extensive G.I. workup. Reamins on low dose levophed--trying to wean her down    VITALS:  Blood pressure (!) 90/41, pulse 67, temperature 98.8 F (37.1 C), temperature source Oral, resp. rate 15, height '4\' 9"'$  (1.448 m), weight 81.1 kg, last menstrual period 05/01/1972, SpO2 98 %.  PHYSICAL EXAMINATION:   GENERAL:  81 y.o.-year-old patient lying in the bed with no acute distress.  Chronically ill, weak Anasarca LUNGS: Normal breath sounds bilaterally, no wheezing CARDIOVASCULAR: S1, S2 normal. No murmurs,   ABDOMEN: Soft, nontender, distended. Bowel sounds present.  EXTREMITIES: chronic  edema b/l.    NEUROLOGIC: nonfocal  patient is alert and awake SKIN: per rn  LABORATORY PANEL:  CBC Recent Labs  Lab 02/03/22 0704  WBC 14.9*  HGB 8.1*  HCT 24.0*  PLT 123*     Chemistries  Recent Labs  Lab 02/03/22 0704  NA 133*  K 3.0*  CL 99  CO2 26  GLUCOSE 140*  BUN 35*  CREATININE 3.10*  CALCIUM 8.1*  MG 1.6*  AST 82*  ALT 42  ALKPHOS 69  BILITOT 2.5*    Cardiac Enzymes No results for input(s): "TROPONINI" in the last 168 hours. RADIOLOGY:  No results found.  Assessment and Plan  April [name] is an 81 year old female with history of liver cirrhosis, end-stage renal disease on hemodialysis Tuesday Thursday Saturday, hypertension, who presents emergency department for chief concerns of weakness and tremors.  CT abdomen pelvis without contrast: Was read as cirrhotic liver with moderate volume ascites.  Small left and trace right pleural  effusion with associated compressive atelectasis.  Moderate large hiatal hernia containing majority of the stomach within the chest.  Aortic atherosclerosis.  Anasarca.  Patient was hypotension was placed in the ICU currently on IV levophed. Lactic acid was elevated panel on empiric IV antibiotics. No fever.  Severe sepsis ([place]) - Etiology unclear -  procalcitonin 3.8, -- blood cultures x2 negative --peritoneal fluid--neg for peritonitis - on broad-spectrum antibiotic with vancomycin, cefepime, metronidazole--de-escalate to cefepime--if all cultures neg then will d/c abxs -  wean off Levophed   Anemia in ESRD (end-stage renal disease) (April [name]) - came in hgb 7.4--6.6--1 unit BT today--8.1 --avoid all blood thinners including heparin   Anasarca ascites  Cirrhosis of liver (from radiologic studies)--pt to see Dr Allen April [name] as outpt H/o GI bleed with recent extensive w/u without obvious surce of bleeding H/o hemorrhoids - IR consulted for paracentesis - Albumin as needed ordered --10/6--s/p paracentesis 2.2liter  Acute on Chronic Diastolic CHF --2D echo on 12/08/2021 showed EF of 55-60% with grade 2 diastolic dysfunction.  Patient has 1+ leg edema, but no JVD, no pulmonary edema on chest x-ray.   --will reusme home torsemide once bp stable -Fluid management per renal by dialysis   ESRD on dialysis [facility]) - Nephrology has been consulted, Dr. Candiss April [name] - Strict I's and O's --UF with HD   H/o HTN (hypertension) Hypotension --cont midodrine and wean levophed as tolerated   OSA on CPAP - CPAP nightly ordered   Paroxysmal atrial fibrillation ([place]) - Patient is not on  anticoagulation due to extensive discussion in the past as she is concerned for bleeding - Amiodarone not continued on admission due to hypotension -  Currently NSR  Morbid obesity (April [name]) - This complicates overall care and prognosis.   H/o recent PE --not on any anticoagulation due to anemia requiring freq BT   Poor  prognosis. Pt followed by Palliative care inpatient and out pt  DVT prophylaxis: SCD (gi bleed) Code Status: DNR Diet: Renal diet Family Communication: Updated son at bedside with patient's permission   Level of care: Stepdown Status is: Inpatient Remains inpatient appropriate because: hypotension    TOTAL TIME TAKING CARE OF THIS PATIENT: 35 minutes.  >50% time spent on counselling and coordination of care  Note: This dictation was prepared with Dragon dictation along with smaller phrase technology. Any transcriptional errors that result from this process are unintentional.  Fritzi Mandes M.D    [facility]   CC: Primary care physician; Einar Pheasant, MD

## 2022-02-03 NOTE — Progress Notes (Signed)
[facility]  ROUNDING NOTE   Subjective:   April [name] is a 81 year old female with past medical history including liver cirrhosis, anemia, diverticulitis, hypertension, CHF, CAD, and end stage renal disease on hemodialysis. She presents to the emergency department with weakness and vomiting. She has been admitted for Lactic acidosis [E87.20] Abdominal distension [R14.0] Severe sepsis (HCC) [A41.9, R65.20] Hypotension, unspecified hypotension type [I95.9] Cirrhosis of liver with ascites, unspecified hepatic cirrhosis type ([place]) [K74.60, R18.8] Other hypervolemia [E87.79]  This patient is known to our clinic and received outpatient dialysis treatments at [facility] on a TTS schedule, supervised by Dr Candiss Norse.   Patient seen and evaluated in ICU Son at bedside Continues to complain of weakness and fatigue Remains on Levo Generalized peripheral edema up to abdominal wall  Objective:  Vital signs in last 24 hours:  Temp:  [97.7 F (36.5 C)-99.5 F (37.5 C)] 98.2 F (36.8 C) (10/06 0800) Pulse Rate:  [69-82] 73 (10/06 1015) Resp:  [12-25] 17 (10/06 1015) BP: (89-136)/(32-71) 111/63 (10/06 1015) SpO2:  [91 %-100 %] 94 % (10/06 1015)  Weight change: 3.989 kg Filed Weights   02/01/22 1137 02/01/22 2250 02/02/22 0825  Weight: 77.1 kg 83.2 kg 81.1 kg    Intake/Output: I/O last 3 completed shifts: In: 1406 [I.V.:421; Blood:350; IV Piggyback:635] Out: 2400 [Other:2000; Stool:400]   Intake/Output this shift:  No intake/output data recorded.  Physical Exam: General: NAD, ill appearing  Head: Normocephalic, atraumatic. Moist oral mucosal membranes  Eyes: Anicteric  Lungs:  Diminished in bases, normal effort, room air  Heart: Regular rate and rhythm  Abdomen:  Soft, nontender  Extremities:  3+  peripheral edema up to abd wall.  Neurologic: Nonfocal, moving all four extremities  Skin: No lesions  Access: Rt chest permcath    Basic Metabolic Panel: Recent  Labs  Lab 02/01/22 1152 02/02/22 0420 02/03/22 0704  NA 130* 133* 133*  K 3.6 3.6 3.0*  CL 99 100 99  CO2 21* 18* 26  GLUCOSE 169* 145* 140*  BUN 49* 54* 35*  CREATININE 4.22* 4.60* 3.10*  CALCIUM 8.2* 8.3* 8.1*  MG  --  1.8 1.6*  PHOS  --  4.2 2.9     Liver Function Tests: Recent Labs  Lab 02/01/22 1152 02/02/22 0420 02/03/22 0704  AST 67* 78* 82*  ALT 38 38 42  ALKPHOS 84 63 69  BILITOT 2.0* 2.1* 2.5*  PROT 5.0* 4.8* 4.6*  ALBUMIN 2.2* 2.2* 2.0*    No results for input(s): "LIPASE", "AMYLASE" in the last 168 hours. Recent Labs  Lab 02/01/22 1723  AMMONIA 30     CBC: Recent Labs  Lab 01/30/22 0000 02/01/22 1153 02/02/22 0420 02/02/22 1819 02/03/22 0704  WBC 9.5 6.8 13.4*  --  14.9*  NEUTROABS 6.20 5.8  --   --   --   HGB 7.4* 7.7* 6.6* 7.4* 8.1*  HCT 23* 24.0* 19.9* 22.4* 24.0*  MCV  --  98.0 95.7  --  92.0  PLT 102* 126* 101*  --  123*     Cardiac Enzymes: No results for input(s): "CKTOTAL", "CKMB", "CKMBINDEX", "TROPONINI" in the last 168 hours.  BNP: Invalid input(s): "POCBNP"  CBG: No results for input(s): "GLUCAP" in the last 168 hours.  Microbiology: Results for orders placed or performed during the hospital encounter of 02/01/22  Culture, blood (Routine x 2)     Status: None (Preliminary result)   Collection Time: 02/01/22 11:53 AM   Specimen: BLOOD  Result  Value Ref Range Status   Specimen Description BLOOD LEFT ANTECUBITAL  Final   Special Requests   Final    BOTTLES DRAWN AEROBIC AND ANAEROBIC Blood Culture results may not be optimal due to an excessive volume of blood received in culture bottles   Culture   Final    NO GROWTH 2 DAYS Performed at [facility], [address], [place], [place] [address]    Report Status PENDING  Incomplete  Culture, blood (Routine x 2)     Status: None (Preliminary result)   Collection Time: 02/01/22  1:16 PM   Specimen: BLOOD  Result Value Ref Range Status   Specimen Description  BLOOD BLOOD LEFT FOREARM  Final   Special Requests BLOOD LEFT ARM Blood Culture adequate volume  Final   Culture   Final    NO GROWTH 2 DAYS Performed at [facility], [address] [place], [place] [address]    Report Status PENDING  Incomplete  Resp Panel by RT-PCR (Flu A&B, Covid) Anterior Nasal Swab     Status: None   Collection Time: 02/01/22  1:17 PM   Specimen: Anterior Nasal Swab  Result Value Ref Range Status   SARS Coronavirus 2 by RT PCR NEGATIVE NEGATIVE Final    Comment: (NOTE) SARS-CoV-2 target nucleic acids are NOT DETECTED.  The SARS-CoV-2 RNA is generally detectable in upper respiratory specimens during the acute phase of infection. The lowest concentration of SARS-CoV-2 viral copies this assay can detect is 138 copies/mL. A negative result does not preclude SARS-Cov-2 infection and should not be used as the sole basis for treatment or other patient management decisions. A negative result may occur with  improper specimen collection/handling, submission of specimen other than nasopharyngeal swab, presence of viral mutation(s) within the areas targeted by this assay, and inadequate number of viral copies(<138 copies/mL). A negative result must be combined with clinical observations, patient history, and epidemiological information. The expected result is Negative.  Fact Sheet for Patients:  EntrepreneurPulse.com.au  Fact Sheet for Healthcare Providers:  IncredibleEmployment.be  This test is no t yet approved or cleared by the [place] FDA and  has been authorized for detection and/or diagnosis of SARS-CoV-2 by FDA under an Emergency Use Authorization (EUA). This EUA will remain  in effect (meaning this test can be used) for the duration of the COVID-19 declaration under Section 564(b)(1) of the Act, 21 U.S.C.section 360bbb-3(b)(1), unless the authorization is terminated  or revoked sooner.       Influenza  A by PCR NEGATIVE NEGATIVE Final   Influenza B by PCR NEGATIVE NEGATIVE Final    Comment: (NOTE) The Xpert Xpress SARS-CoV-2/FLU/RSV plus assay is intended as an aid in the diagnosis of influenza from Nasopharyngeal swab specimens and should not be used as a sole basis for treatment. Nasal washings and aspirates are unacceptable for Xpert Xpress SARS-CoV-2/FLU/RSV testing.  Fact Sheet for Patients: EntrepreneurPulse.com.au  Fact Sheet for Healthcare Providers: IncredibleEmployment.be  This test is not yet approved or cleared by the [place] FDA and has been authorized for detection and/or diagnosis of SARS-CoV-2 by FDA under an Emergency Use Authorization (EUA). This EUA will remain in effect (meaning this test can be used) for the duration of the COVID-19 declaration under Section 564(b)(1) of the Act, 21 U.S.C. section 360bbb-3(b)(1), unless the authorization is terminated or revoked.  Performed at [facility], [address] [place], [place] [address]   Peritoneal fluid culture w Gram Stain  Status: None (Preliminary result)   Collection Time: 02/01/22  4:59 PM   Specimen: Peritoneal Washings; Peritoneal Fluid  Result Value Ref Range Status   Specimen Description   Final    PERITONEAL Performed at [facility], [address] [place], [place] [address]    Special Requests   Final    NONE Performed at [facility], [address] [place], [place] [address]    Gram Stain NO ORGANISMS SEEN NO WBC SEEN   Final   Culture   Final    NO GROWTH 2 DAYS Performed at [facility], [address], [place], [place] [address]    Report Status PENDING  Incomplete  MRSA Next Gen by PCR, Nasal     Status: None   Collection Time: 02/01/22 10:53 PM   Specimen: Nasal Mucosa; Nasal Swab  Result Value Ref Range Status   MRSA by PCR Next Gen NOT DETECTED NOT DETECTED Final    Comment: (NOTE) The  GeneXpert MRSA Assay (FDA approved for NASAL specimens only), is one component of a comprehensive MRSA colonization surveillance program. It is not intended to diagnose MRSA infection nor to guide or monitor treatment for MRSA infections. Test performance is not FDA approved in patients less than 83 years old. Performed at [facility], [address] [place], [place] [address]   C Difficile Quick Screen w PCR reflex     Status: None   Collection Time: 02/02/22  5:58 PM   Specimen: STOOL  Result Value Ref Range Status   C Diff antigen NEGATIVE NEGATIVE Final   C Diff toxin NEGATIVE NEGATIVE Final   C Diff interpretation No C. difficile detected.  Final    Comment: Performed at [facility], [address] [place], [place] [address]  Gastrointestinal Panel by PCR , Stool     Status: None   Collection Time: 02/02/22  5:58 PM   Specimen: Stool  Result Value Ref Range Status   Campylobacter species NOT DETECTED NOT DETECTED Final   Plesimonas shigelloides NOT DETECTED NOT DETECTED Final   Salmonella species NOT DETECTED NOT DETECTED Final   Yersinia enterocolitica NOT DETECTED NOT DETECTED Final   Vibrio species NOT DETECTED NOT DETECTED Final   Vibrio cholerae NOT DETECTED NOT DETECTED Final   Enteroaggregative E coli (EAEC) NOT DETECTED NOT DETECTED Final   Enteropathogenic E coli (EPEC) NOT DETECTED NOT DETECTED Final   Enterotoxigenic E coli (ETEC) NOT DETECTED NOT DETECTED Final   Shiga like toxin producing E coli (STEC) NOT DETECTED NOT DETECTED Final   Shigella/Enteroinvasive E coli (EIEC) NOT DETECTED NOT DETECTED Final   Cryptosporidium NOT DETECTED NOT DETECTED Final   Cyclospora cayetanensis NOT DETECTED NOT DETECTED Final   Entamoeba histolytica NOT DETECTED NOT DETECTED Final   Giardia lamblia NOT DETECTED NOT DETECTED Final   Adenovirus F40/41 NOT DETECTED NOT DETECTED Final   Astrovirus NOT DETECTED NOT DETECTED Final   Norovirus GI/GII NOT  DETECTED NOT DETECTED Final   Rotavirus A NOT DETECTED NOT DETECTED Final   Sapovirus (I, II, IV, and V) NOT DETECTED NOT DETECTED Final    Comment: Performed at [facility], [address] [place], [place] [address]    Coagulation Studies: Recent Labs    02/01/22 1152  LABPROT 15.5*  INR 1.2     Urinalysis: No results for input(s): "COLORURINE", "LABSPEC", "PHURINE", "GLUCOSEU", "HGBUR", "BILIRUBINUR", "KETONESUR", "PROTEINUR", "UROBILINOGEN", "NITRITE", "LEUKOCYTESUR" in the last 72 hours.  Invalid input(s): "APPERANCEUR"  Imaging: CT Abdomen Pelvis Wo Contrast  Result Date: 02/01/2022 CLINICAL DATA:  Abdominal pain, acute, nonlocalized EXAM: CT ABDOMEN AND PELVIS WITHOUT CONTRAST TECHNIQUE: Multidetector CT imaging of the abdomen and pelvis was performed following the standard protocol without IV contrast. RADIATION DOSE REDUCTION: This exam was performed according to the departmental dose-optimization program which includes automated exposure control, adjustment of the mA and/or kV according to patient size and/or use of iterative reconstruction technique. COMPARISON:  11/29/2020 FINDINGS: Lower chest: Small left and trace right pleural effusions with associated compressive atelectasis. Heart size within normal limits. Relative hypoattenuation of the cardiac blood pool indicative of anemia. Moderate-large hiatal hernia. Hepatobiliary: Shrunken, nodular appearance of the liver compatible with cirrhosis. No definite liver lesion identified on unenhanced images. Moderately distended gallbladder. No hyperdense gallstone. Pancreas: Unremarkable. No pancreatic ductal dilatation or surrounding inflammatory changes. Spleen: Normal in size without focal abnormality. Adrenals/Urinary Tract: Adrenal glands are unremarkable. Kidneys are normal, without renal calculi, solid lesion, or hydronephrosis. Bladder is unremarkable. Stomach/Bowel: Moderate-large hiatal hernia containing the  majority of the stomach within the chest. Appendix not visualized, and may be surgically absent. Left-sided colonic diverticulosis. No evidence of bowel wall thickening, distention, or inflammatory changes. Vascular/Lymphatic: Aortic atherosclerosis. No enlarged abdominal or pelvic lymph nodes. Reproductive: Status post hysterectomy. No adnexal masses. Other: Moderate volume ascites. No organized abdominopelvic fluid collections. No pneumoperitoneum. Musculoskeletal: Anasarca.  No acute bony findings. IMPRESSION: 1. Cirrhotic liver with moderate volume ascites. 2. Small left and trace right pleural effusions with associated compressive atelectasis. 3. Moderate-large hiatal hernia containing the majority of the stomach within the chest. 4. Left-sided colonic diverticulosis without evidence for acute diverticulitis. 5. Anasarca. 6. Aortic atherosclerosis (ICD10-I70.0). 7. Relative hypoattenuation of the cardiac blood pool indicative of anemia. Electronically Signed   By: Davina Poke D.O.   On: 02/01/2022 13:40   DG Chest 2 View  Result Date: 02/01/2022 CLINICAL DATA:  Weakness with vomiting.  Suspected sepsis. EXAM: CHEST - 2 VIEW COMPARISON:  Radiographs 01/11/2022 and 01/09/2022.  CT 01/11/2022. FINDINGS: Right IJ hemodialysis catheter is unchanged, projecting to the level of the upper right atrium. The heart size and mediastinal contours are stable with a large hiatal hernia. Unchanged small bilateral pleural effusions with associated left greater than right basilar atelectasis. No edema or confluent airspace opacity identified. There is no pneumothorax. The bones appear unchanged. IMPRESSION: Stable chest with small bilateral pleural effusions and bibasilar atelectasis. Large hiatal hernia. No acute findings. Electronically Signed   By: Richardean Sale M.D.   On: 02/01/2022 12:36     Medications:    sodium chloride Stopped (02/02/22 0400)   albumin human     albumin human     ceFEPime (MAXIPIME)  IV 200 mL/hr at 02/03/22 0600   norepinephrine (LEVOPHED) Adult infusion 4 mcg/min (02/03/22 0600)    sodium chloride   Intravenous Once   Chlorhexidine Gluconate Cloth  6 each Topical Q0600   Chlorhexidine Gluconate Cloth  6 each Topical Q0600   docusate sodium  100 mg Oral BID   feeding supplement (NEPRO CARB STEADY)  237 mL Oral TID BM   feeding supplement (PRO-STAT SUGAR FREE 64)  30 mL Oral TID WC   lidocaine  1 patch Transdermal Q24H   midodrine  10 mg Oral TID WC   multivitamin  1 tablet Oral QHS   pantoprazole (PROTONIX) IV  40 mg Intravenous BID   rifaximin  200 mg Oral BID   acetaminophen **OR** acetaminophen, albumin human, diclofenac Sodium, heparin, ipratropium-albuterol,  nitroGLYCERIN, ondansetron **OR** ondansetron (ZOFRAN) IV  Assessment/ Plan:  Ms. ENVY MENO is a 81 y.o.  female with past medical history including liver cirrhosis, anemia, diverticulitis, hypertension, CHF, CAD, and end stage renal disease on hemodialysis. She presents to the emergency department with weakness and vomiting. She has been admitted for Lactic acidosis [E87.20] Abdominal distension [R14.0] Severe sepsis (HCC) [A41.9, R65.20] Hypotension, unspecified hypotension type [I95.9] Cirrhosis of liver with ascites, unspecified hepatic cirrhosis type ([place]) [K74.60, R18.8] Other hypervolemia [E87.79]  [facility]/TTS/right chest PermCath  End stage renal disease on hemodialysis. Will maintain outpatient schedule during this admission. Received dialysis yesterday, UF goal 2L achieved. Next treatment scheduled for Saturday.  2. Anemia of chronic kidney disease  Lab Results  Component Value Date   HGB 8.1 (L) 02/03/2022  Reports recent history of dark stools.  Hgb improved to 8.1 after blood transfusion yesterday.   3. 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 Calcium and phosphorus within acceptable range.  4. Hypotension due to suspected sepsis. Workup ongoing. Empirical antibiotics ordered by primary team. Receiving Levo for blood pressure support.    LOS: 2 Velecia Ovitt 10/6/202311:06 AM

## 2022-02-03 NOTE — Progress Notes (Signed)
NAME:  April [name], MRN:  [id number], DOB:  1940/10/31, LOS: 2 ADMISSION DATE:  02/01/2022, CONSULTATION DATE:  02/02/22 REFERRING MD:  Morton Amy, NP, CHIEF COMPLAINT:  Weakness, nausea & vomiting   History of Present Illness:  81 yo F presenting to [facility] ED on 02/01/22 from [place] facility for evaluation of weakness, emesis & tremors. She reports nausea and an episode of non bloody bilious emesis as well as weakness & fatigue.  She also confirms increased BLE swelling and weight gain. She confirms completing her iHD session on 10/3.  She admits to SOB, abdominal distention, BLE edema but reports these issues have been ongoing since March. She denies fever, but reports always being cold. She denies productive cough, chest pain, abdominal pain, diarrhea, syncope, falls, blurry vision. She denies tobacco [place], recreational drug use, ETOH use.  And confirms taking all medication as prescribed including her rifaximin & lactulose.  ED course: Upon arrival patient was hypotensive and sepsis protocol initiated. Diagnostic paracentesis performed and lab work sent. TRH consulted for admission Medications given: NS bolus 500 mL, cefepime/vancomycin/flagyl Initial Vitals: afebrile 98.8, mild tachypnea 20, HR - 89, hypotensive 87/50 & 94% on RA Significant labs: (Labs/ Imaging personally reviewed) I, Domingo Pulse [place], AGACNP-BC, personally viewed and interpreted this ECG. EKG Interpretation: Date: 02/01/22, EKG Time: 11:52, Rate: 85, Rhythm: LBBB, QRS Axis:  normal, Intervals: 1st degree HB, LBBB & prolonged QTc, ST/T Wave abnormalities: non specific T wave inversions, Narrative Interpretation: 1st degree HB & LBBB (unchanged from previous) Chemistry: Na+: 130, K+: 3.6, BUN/Cr.: 49/ 4.22, Serum CO2/ AG: 21/10, AST: 67 Hematology: WBC: 6.8, Hgb: 7.7, plt: 126  BNP: pending, Lactic/ PCT: 6.6 > 5.7 > 6.0/ 3.85, COVID-19 & Influenza A/B: negative  CXR 02/01/22: small bilateral effusions & bibasilar  atelectasis. Large hiatal hernia. CT abdomen/pelvis wo contrast 02/01/22: Cirrhotic liver with moderate volume ascites. Small left and trace right pleural effusions with associated compressive atelectasis. Moderate-large hiatal hernia containing the majority of the stomach within the chest. Left-sided colonic diverticulosis without evidence for acute diverticulitis. Anasarca.  Patient continues to have marginal blood pressures with SBP 100-90's and MAP in the 50's. She received a total of 750 mL of fluid due to anasarca & albumin post paracentesis. PCCM consulted for assistance in management and monitoring due to suspected sepsis with shock requiring peripheral vasopressor support.  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 Cirrhosis HFpEF CAD s/p PCI PAF (not on [place] due to GIB) Symptomatic anemia with recurrent GIB HTN HLD OSA  MICRO Data:  10/4: SARS-CoV-2 and influenza PCR>> negative 10/5: Acute hepatitis B viral panel>> nonreactive 10/4: Blood culture x2>> no growth to date 10/4: Diagnostic paracentesis>> no growth to date 10/4: MRSA PCR>> negative  Antimicrobials:  Cefepime 10/4>> Flagyl 10/4>> 10/5 Vancomycin 10/4>> 10/5  Significant Hospital Events: Including procedures, antibiotic start and stop dates in addition to other pertinent events   02/01/22: Admit to SDU with suspected sepsis s/t suspected SBP requiring peripheral vasopressor support. 02/02/22: Requiring low dose peripheral Levophed.  1 unit pRBC's given for Hgb   Tolerated HD. 02/03/22: Remains on low dose Levophed, but weaning down. Hgb stable at 8.1 s/p 1 unit pRBC yesterday. Plan for Paracentesis with IR today.  PT/OT consulted.  Interim History / Subjective:  -No significant events reported overnight -Afebrile, continues to require low dose peripheral Levophed (3 mcg), in process of weaning as tolerated,  on room air -Plan for Paracentesis with IR today -Rectal tube removed per pt  request -Hgb remains  stable at 8.1 (7.4) s/p 1 unit pRBC's yesterday -Platelets slightly improved to 123 from 101 -Leukocytosis slightly worse at 14.9 from 13.4, PCT is pending    Objective   Blood pressure (!) 122/44, pulse 72, temperature 99.5 F (37.5 C), temperature source Oral, resp. rate 18, height '4\' 9"'$  (1.448 m), weight 81.1 kg, last menstrual period 05/01/1972, SpO2 97 %.        Intake/Output Summary (Last 24 hours) at 02/03/2022 0805 Last data filed at 02/03/2022 0600 Gross per 24 hour  Intake 1014.97 ml  Output 2400 ml  Net -1385.03 ml   Filed Weights   02/01/22 1137 02/01/22 2250 02/02/22 0825  Weight: 77.1 kg 83.2 kg 81.1 kg    Examination: General: Adult female, acutely ill appearing, lying in bed, NAD HEENT: MM pink/moist, anicteric, atraumatic, neck supple Neuro: A&O x 4, able to follow commands, PERRL +3, MAE CV: s1s2 RRR, LBBB on monitor, no r/m/g Pulm: Regular, mild dyspnea on RA, breath sounds clear-BUL & diminished-BLL GI: soft, distended with hiatal hernia, no tenderness over para site, bs x 4 Skin:  no rashes/lesions noted Extremities: warm/dry, pulses + 2 R/P, +4 edema noted BLE  Resolved Hospital Problem list     Assessment & Plan:   Shock: Suspect Hypovolemic +/- Septic Chronic HFpEF Mildly elevated troponin, suspect demand ischemia PAF not on Anticoagulation (no AC due to concern for bleeding) PMHx: HTN Echocardiogram 01/11/22: LVEF 55-60%, indeterminate Diastolic parameters, RV function normal, + pulmonary hypertension, moderate mitral and tricuspid regurgitation -Continuous cardiac monitoring -Maintain MAP >60 -Vasopressors as needed to maintain MAP goal -Start Midodrine -Trend lactic acid until normalized -Trend HS Troponin until peaked (22 ~ 32 ~) -Hold home antihypertensives -Volume removal with HD  Severe Sepsis, unclear source currently, but suspected SBP -CT Abdomen & Pelvis on admission with cirrhosis and moderate volume ascites, large hiatal  hernia -S/p Diagnostic Paracentesis on 10/4  in ED, cultures currently with no growth -CXR on admission without obvious Pneumonia -GI panel & C. Diff PCR negative -Pt is anuric, unable to obtain UA -Monitor fever curve -Trend WBC's & Procalcitonin -Follow cultures as above -Continue empiric Cefepime pending cultures & sensitivities -IR has already been consulted by [facility] for paracentesis ~ tentative plan for Para today 10/6  Anasarca Ascites Cirrhosis of liver PMHx: GI bleed without any obvious bleeding source -Trend LFT's and coags -IV Albumin as needed -IR consulted for Paracentesis -Continue Rifaximin   OSA -Supplemental O2 as needed to maintain O2 sats >90% -CPAP qhs -Follow intermittent Chest X-ray & ABG as needed -Bronchodilators as needed -Pulmonary toilet as able  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 -Monitor I&O's / urinary output -Follow BMP -Ensure adequate renal perfusion -Avoid nephrotoxic agents as able -Replace electrolytes as indicated -Nephrology following, appreciate input ~ HD as per Nephrology  Anemia of Chronic Disease PMHx: GI bleed with extensive w/u without obvious bleeding source, hemorrhoids -Monitor for S/Sx of bleeding -Trend CBC -SCD's for VTE Prophylaxis  -Transfuse for Hgb <7    Best Practice (right click and "Reselect all SmartList Selections" daily)  Diet/type: Regular consistency (see orders) DVT prophylaxis: SCD's (GI bleed) GI prophylaxis: PPI Lines: N/A Foley:  N/A Code Status:  DNR Last date of multidisciplinary goals of care discussion [02/03/2022]  Will update pt's family when they arrive at bedside.  Labs   CBC: Recent Labs  Lab 01/30/22 0000 02/01/22 1153 02/02/22 0420 02/02/22 1819 02/03/22 0704  WBC 9.5 6.8 13.4*  --  14.9*  NEUTROABS 6.20  5.8  --   --   --   HGB 7.4* 7.7* 6.6* 7.4* 8.1*  HCT 23* 24.0* 19.9* 22.4* 24.0*  MCV  --  98.0 95.7  --  92.0  PLT 102* 126* 101*  --  123*     Basic Metabolic Panel: Recent Labs  Lab  02/01/22 1152 02/02/22 0420 02/03/22 0704  NA 130* 133* 133*  K 3.6 3.6 3.0*  CL 99 100 99  CO2 21* 18* 26  GLUCOSE 169* 145* 140*  BUN 49* 54* 35*  CREATININE 4.22* 4.60* 3.10*  CALCIUM 8.2* 8.3* 8.1*  MG  --  1.8 1.6*  PHOS  --  4.2 2.9    GFR: Estimated Creatinine Clearance: 12.1 mL/min (A) (by C-G formula based on SCr of 3.1 mg/dL (H)). Recent Labs  Lab 01/30/22 0000 02/01/22 1152 02/01/22 1153 02/01/22 1442 02/01/22 1723 02/02/22 0420 02/03/22 0704  PROCALCITON  --   --   --   --  3.85  --   --   WBC 9.5  --  6.8  --   --  13.4* 14.9*  LATICACIDVEN  --  6.6*  --  5.7* 6.0*  --   --      Liver Function Tests: Recent Labs  Lab 02/01/22 1152 02/02/22 0420 02/03/22 0704  AST 67* 78* 82*  ALT 38 38 42  ALKPHOS 84 63 69  BILITOT 2.0* 2.1* 2.5*  PROT 5.0* 4.8* 4.6*  ALBUMIN 2.2* 2.2* 2.0*    No results for input(s): "LIPASE", "AMYLASE" in the last 168 hours. Recent Labs  Lab 02/01/22 1723  AMMONIA 30     ABG    Component Value Date/Time   PHART 7.6 (H) 10/18/2021 0313   PCO2ART 47 10/18/2021 0313   PO2ART 78 (L) 10/18/2021 0313   HCO3 46.1 (H) 10/18/2021 0313   TCO2 25 03/25/2016 1927   O2SAT 97.4 10/18/2021 0313     Coagulation Profile: Recent Labs  Lab 02/01/22 1152  INR 1.2     Cardiac Enzymes: No results for input(s): "CKTOTAL", "CKMB", "CKMBINDEX", "TROPONINI" in the last 168 hours.  HbA1C: Hgb A1c MFr Bld  Date/Time Value Ref Range Status  09/21/2021 01:33 PM 6.3 4.6 - 6.5 % Final    Comment:    Glycemic Control Guidelines for People with Diabetes:Non Diabetic:  <6%Goal of Therapy: <7%Additional Action Suggested:  >8%   01/20/2021 09:20 AM 6.2 4.6 - 6.5 % Final    Comment:    Glycemic Control Guidelines for People with Diabetes:Non Diabetic:  <6%Goal of Therapy: <7%Additional Action Suggested:  >8%     CBG: No results for input(s): "GLUCAP" in the last 168 hours.  Review of Systems: Positives in BOLD  Gen: Denies fever,  chills, weight change, fatigue, night sweats HEENT: Denies blurred vision, double vision, hearing loss, tinnitus, sinus congestion, rhinorrhea, sore throat, neck stiffness, dysphagia PULM: Denies shortness of breath, cough, sputum production, hemoptysis, wheezing CV: Denies chest pain, edema, orthopnea, paroxysmal nocturnal dyspnea, palpitations GI: Denies abdominal pain, nausea, vomiting, diarrhea, hematochezia, melena, constipation, change in bowel habits GU: Denies dysuria, hematuria, polyuria, oliguria, urethral discharge Endocrine: Denies hot or cold intolerance, polyuria, polyphagia or appetite change Derm: Denies rash, dry skin, scaling or peeling skin change Heme: Denies easy bruising, bleeding, bleeding gums Neuro: Denies headache, numbness, weakness, slurred speech, loss of memory or consciousness  Past Medical History:  She,  has a past medical history of Anemia, CAD S/P PCI pRCA Promus DES 3.5 x 16 (4.1 mm), ostRPDA  Promus DES 2.5 x 12 (2.7 mm) (03/25/2016), CAD S/P percutaneous coronary angioplasty, CHF (congestive heart failure) ([place]), CKD (chronic kidney disease), stage III ([place]), Diabetes mellitus without complication ([place]), Diastolic dysfunction, Diverticulitis, Dyspnea, Endometriosis, Family history of adverse reaction to anesthesia, GERD (gastroesophageal reflux disease), GI bleed, Hiatal hernia, History of colon polyps (08/1994), History of Migraines, Hypercholesterolemia, Hypertension, LBBB (left bundle branch block), Osteoporosis, PAF (paroxysmal atrial fibrillation) ([place]), Rheumatic fever, Sleep apnea, Spasmodic dysphonia, ST elevation myocardial infarction (STEMI) of inferolateral wall, initial episode of care [facility] (03/25/2016), ST elevation myocardial infarction (STEMI) of inferolateral wall, initial episode of care ([place]) (03/25/2016), and Urine incontinence.   Surgical History:   Past Surgical History:  Procedure Laterality Date   ABDOMINAL HYSTERECTOMY     ovaries not  removed   APPENDECTOMY     was removed during hysterectomy   Breast biopsies     x2   BREAST EXCISIONAL BIOPSY Bilateral "years ago"   neg   BREAST SURGERY Bilateral    BROW LIFT Bilateral 08/15/2019   Procedure: BLEPHAROPLASTY UPPER EYELID; W/EXCESS SKIN BLEPHAROPTOSIS REPAIR; RESECT EX;  Surgeon: Karle Starch, MD;  Location: [place];  Service: Ophthalmology;  Laterality: Bilateral;  sleep apnea   CARDIAC CATHETERIZATION  2011   moderate 40% RCA disease   CARDIAC CATHETERIZATION  01/2010   Dr. Gollan'@[facility]'$ : Only noted 40% RCA   CARDIAC CATHETERIZATION N/A 03/25/2016   Procedure: Left Heart Cath and Coronary Angiography;  Surgeon: 04/07/2016, MD;  Location: [place] CV LAB;  Service: Cardiovascular;  Laterality: N/A;   CARDIAC CATHETERIZATION N/A 03/25/2016   Procedure: Coronary Stent Intervention;  Surgeon: 04/07/2016, MD;  Location: [place] CV LAB;  Service: Cardiovascular;  Laterality: N/A;   COLONOSCOPY  2013   COLONOSCOPY WITH PROPOFOL N/A 07/11/2021   Procedure: COLONOSCOPY WITH PROPOFOL;  Surgeon: 07/24/2021, MD;  Location: [facility] ENDOSCOPY;  Service: Endoscopy;  Laterality: N/A;   DIALYSIS/PERMA CATHETER INSERTION N/A 12/15/2021   Procedure: DIALYSIS/PERMA CATHETER INSERTION;  Surgeon: 12/28/2021, MD;  Location: [place] CV LAB;  Service: Cardiovascular;  Laterality: N/A;   ESOPHAGOGASTRODUODENOSCOPY (EGD) WITH PROPOFOL N/A 03/17/2015   Procedure: ESOPHAGOGASTRODUODENOSCOPY (EGD) WITH PROPOFOL;  Surgeon: 03/30/2015, MD;  Location: [facility] ENDOSCOPY;  Service: Gastroenterology;  Laterality: N/A;   ESOPHAGOGASTRODUODENOSCOPY (EGD) WITH PROPOFOL N/A 07/10/2021   Procedure: ESOPHAGOGASTRODUODENOSCOPY (EGD) WITH PROPOFOL;  Surgeon: 16/03/2022, MD;  Location: [facility] ENDOSCOPY;  Service: Gastroenterology;  Laterality: N/A;   GIVENS CAPSULE STUDY N/A 07/11/2021   Procedure: GIVENS CAPSULE STUDY;  Surgeon: 07/24/2021, MD;  Location: [facility]  ENDOSCOPY;  Service: Endoscopy;  Laterality: N/A;   NM MYOVIEW ([place] HX)  07/2015   No evidence ischemia or infarction. EF 66%. Low risk   TEMPORARY DIALYSIS CATHETER N/A 12/08/2021   Procedure: TEMPORARY DIALYSIS CATHETER;  Surgeon: 21/01/2022, MD;  Location: [place] CV LAB;  Service: Cardiovascular;  Laterality: N/A;   TONSILLECTOMY     TRANSTHORACIC ECHOCARDIOGRAM  10/2013   Normal LV size and function. EF 55-65%. GR 1 DD. Otherwise normal.     Social History:   reports that she has never smoked. She has never used smokeless tobacco. She reports that she does not drink alcohol and does not use drugs.   Family History:  Her family history includes Arthritis in her mother; Breast cancer in an other family member; Heart attack in her daughter, father, and sister; Heart disease in her father; Hypertension in her mother; Osteoporosis in her  mother; Stroke in her brother, daughter, and mother. There is no history of Colon cancer.   Allergies Allergies  Allergen Reactions   Penicillins Other (See Comments)    Okay to take amoxicillin/(pt does not recall what the reaction to penicillin was (56-87 years old)    Sulfa Antibiotics Rash     Home Medications  Prior to Admission medications   Medication Sig Start Date End Date Taking? Authorizing Provider  acetaminophen (TYLENOL) 325 MG tablet Take 650 mg by mouth every 4 (four) hours as needed for mild pain or fever.   Yes [provider]  Amino Acids-Protein Hydrolys (FEEDING SUPPLEMENT, PRO-STAT SUGAR FREE 64,) LIQD Take 30 mLs by mouth 3 (three) times daily with meals.   Yes [provider]  amiodarone (PACERONE) 200 MG tablet Take 1 tablet (200 mg total) by mouth daily. 12/20/21  Yes Nolberto Hanlon, MD  docusate sodium (STOOL SOFTENER) 100 MG capsule Take 1 capsule (100 mg total) by mouth 2 (two) times daily. 12/19/21  Yes Nolberto Hanlon, MD  famotidine (PEPCID) 20 MG tablet Take 20 mg by mouth 2 (two) times daily.   Yes  [provider]  lactulose (CHRONULAC) 10 GM/15ML solution Take 30 mLs (20 g total) by mouth 2 (two) times daily. 12/19/21  Yes Nolberto Hanlon, MD  rifaximin (XIFAXAN) 200 MG tablet Take 1 tablet (200 mg total) by mouth 2 (two) times daily. 12/19/21  Yes Nolberto Hanlon, MD  torsemide 40 MG TABS Take 40 mg by mouth 3 (three) times a week. 12/21/21  Yes Nolberto Hanlon, MD  albuterol (VENTOLIN HFA) 108 (90 Base) MCG/ACT inhaler Inhale 2 puffs into the lungs every 6 (six) hours as needed for wheezing or shortness of breath. 01/11/22   Shelly Coss, MD  diclofenac Sodium (VOLTAREN) 1 % GEL Apply 2 g topically 3 (three) times daily as needed (pain in legs and elbows). 12/19/21   Nolberto Hanlon, MD  ipratropium-albuterol (DUONEB) 0.5-2.5 (3) MG/3ML SOLN Take 3 mLs by nebulization every 6 (six) hours as needed. 01/11/22   Shelly Coss, MD  lidocaine (LIDODERM) 5 % Place 2 patches onto the skin daily. Remove & Discard patch within 12 hours or as directed by MD Patient taking differently: Place 1 patch onto the skin daily. Remove & Discard patch within 12 hours PRN or as directed by MD 12/19/21   Nolberto Hanlon, MD  Multiple Vitamins-Minerals (MULTIVITAMIN WITH MINERALS) tablet Take 1 tablet by mouth daily.    [provider]  nitroGLYCERIN (NITROSTAT) 0.4 MG SL tablet Place 1 tablet (0.4 mg total) under the tongue every 5 (five) minutes as needed for chest pain. 12/19/21   Nolberto Hanlon, MD     Critical care time: 40 minutes    Darel Hong, AGACNP-BC [facility] Prefer epic messenger for cross cover needs If after hours, please call E-link

## 2022-02-03 NOTE — Procedures (Signed)
PROCEDURE SUMMARY:  Successful ultrasound guided paracentesis from the right lower quadrant.  Yielded 2.2 L of clear yellow fluid.  No immediate complications.  The patient tolerated the procedure well.   Specimen sent for labs.  EBL < 53m  If the patient eventually requires >/=2 paracenteses in a 30 day period, screening evaluation by the [facility] will be assessed.  JSoyla Dryer ASouth [id number] [phone] 02/03/2022, 3:31 PM

## 2022-02-03 NOTE — Progress Notes (Signed)
Established hemodialysis patient known at [place] 11:00am.

## 2022-02-03 NOTE — Progress Notes (Signed)
OT Cancellation Note  Patient Details Name: April [name] MRN: [id number] DOB: 01-Feb-1941   Cancelled Treatment:    Reason Eval/Treat Not Completed: Medical issues which prohibited therapy. Consult received, chart reviewed. Vitals outside of safe level for OT services at this time. BP low and MAP at 55 mmMg most of morning. Will continue to follow and attempt evaluation once medically pressures more appropriate.  Ardeth Perfect., MPH, MS, OTR/L ascom [phone] 02/03/22, 2:47 PM

## 2022-02-03 NOTE — Progress Notes (Signed)
PHARMACY CONSULT NOTE  Pharmacy Consult for Electrolyte Monitoring and Replacement   Recent Labs: Potassium (mmol/L)  Date Value  02/03/2022 3.0 (L)  03/23/2014 3.9   Magnesium (mg/dL)  Date Value  02/03/2022 1.6 (L)   Calcium (mg/dL)  Date Value  02/03/2022 8.1 (L)   Albumin (g/dL)  Date Value  02/03/2022 2.0 (L)   Phosphorus (mg/dL)  Date Value  02/03/2022 2.9   Sodium (mmol/L)  Date Value  02/03/2022 133 (L)  04/29/2018 147 (H)    Assessment: 81 y.o. female with medical history significant of ESRD-HD (TTS), HTN, hyperlipidemia, COPD, GERD, CAD, STEMI, DES stent placement, PAF not on anticoagulants due to GI bleeding, liver cirrhosis, left bundle blockade, migraine, dCHF, OSA on CPAP, who presents with Weakness, emesis, tremors. Pharmacy is asked to follow and replace electrolytes   Goal of Therapy:  Electrolytes WNL  Plan:  1 gram IV magnesium sulfate x 1 20 mEq oral KCl x 1 Recheck electrolytes in am  Dallie Piles ,PharmD Clinical Pharmacist 02/03/2022 7:38 AM

## 2022-02-04 ENCOUNTER — Other Ambulatory Visit: Payer: Self-pay

## 2022-02-04 DIAGNOSIS — A419 Sepsis, unspecified organism: Secondary | ICD-10-CM | POA: Diagnosis not present

## 2022-02-04 DIAGNOSIS — R6520 Severe sepsis without septic shock: Secondary | ICD-10-CM | POA: Diagnosis not present

## 2022-02-04 LAB — CBC
HCT: 22.3 % — ABNORMAL LOW (ref 36.0–46.0)
Hemoglobin: 7.4 g/dL — ABNORMAL LOW (ref 12.0–15.0)
MCH: 30.7 pg (ref 26.0–34.0)
MCHC: 33.2 g/dL (ref 30.0–36.0)
MCV: 92.5 fL (ref 80.0–100.0)
Platelets: 120 10*3/uL — ABNORMAL LOW (ref 150–400)
RBC: 2.41 MIL/uL — ABNORMAL LOW (ref 3.87–5.11)
RDW: 18.9 % — ABNORMAL HIGH (ref 11.5–15.5)
WBC: 11.6 10*3/uL — ABNORMAL HIGH (ref 4.0–10.5)
nRBC: 0.3 % — ABNORMAL HIGH (ref 0.0–0.2)

## 2022-02-04 LAB — RENAL FUNCTION PANEL
Albumin: 1.7 g/dL — ABNORMAL LOW (ref 3.5–5.0)
Anion gap: 6 (ref 5–15)
BUN: 46 mg/dL — ABNORMAL HIGH (ref 8–23)
CO2: 25 mmol/L (ref 22–32)
Calcium: 7.8 mg/dL — ABNORMAL LOW (ref 8.9–10.3)
Chloride: 100 mmol/L (ref 98–111)
Creatinine, Ser: 4 mg/dL — ABNORMAL HIGH (ref 0.44–1.00)
GFR, Estimated: 11 mL/min — ABNORMAL LOW (ref >60)
Glucose, Bld: 126 mg/dL — ABNORMAL HIGH (ref 70–99)
Phosphorus: 3 mg/dL (ref 2.5–4.6)
Potassium: 3 mmol/L — ABNORMAL LOW (ref 3.5–5.1)
Sodium: 131 mmol/L — ABNORMAL LOW (ref 135–145)

## 2022-02-04 LAB — MAGNESIUM: Magnesium: 2 mg/dL (ref 1.7–2.4)

## 2022-02-04 LAB — HEPATIC FUNCTION PANEL
ALT: 36 U/L (ref 0–44)
AST: 59 U/L — ABNORMAL HIGH (ref 15–41)
Albumin: 1.8 g/dL — ABNORMAL LOW (ref 3.5–5.0)
Alkaline Phosphatase: 85 U/L (ref 38–126)
Bilirubin, Direct: 0.5 mg/dL — ABNORMAL HIGH (ref 0.0–0.2)
Indirect Bilirubin: 0.9 mg/dL (ref 0.3–0.9)
Total Bilirubin: 1.4 mg/dL — ABNORMAL HIGH (ref 0.3–1.2)
Total Protein: 4.3 g/dL — ABNORMAL LOW (ref 6.5–8.1)

## 2022-02-04 LAB — PROCALCITONIN: Procalcitonin: 4.31 ng/mL

## 2022-02-04 MED ORDER — ALBUMIN HUMAN 25 % IV SOLN
25.0000 g | Freq: Once | INTRAVENOUS | Status: AC
  Administered 2022-02-04: 25 g via INTRAVENOUS
  Filled 2022-02-04: qty 100

## 2022-02-04 MED ORDER — PHENOL 1.4 % MT LIQD
1.0000 | OROMUCOSAL | Status: DC | PRN
  Administered 2022-02-04: 1 via OROMUCOSAL
  Filled 2022-02-04: qty 177

## 2022-02-04 NOTE — Progress Notes (Signed)
[facility]  ROUNDING NOTE   Subjective:   April [name] is a 81 year old female with past medical history including liver cirrhosis, anemia, diverticulitis, hypertension, CHF, CAD, and end stage renal disease on hemodialysis. She presents to the emergency department with weakness and vomiting. She has been admitted for Lactic acidosis [E87.20] Abdominal distension [R14.0] Severe sepsis (HCC) [A41.9, R65.20] Hypotension, unspecified hypotension type [I95.9] Cirrhosis of liver with ascites, unspecified hepatic cirrhosis type ([place]) [K74.60, R18.8] Other hypervolemia [E87.79]  This patient is known to our clinic and received outpatient dialysis treatments at [facility] on a TTS schedule, supervised by Dr Candiss Norse.   Patient seen and evaluated in ICU Son at bedside Patient seen during dialysis Tolerating well    HEMODIALYSIS FLOWSHEET:  Blood Flow Rate (mL/min): 400 mL/min Arterial Pressure (mmHg): -140 mmHg Venous Pressure (mmHg): 150 mmHg TMP (mmHg): 6 mmHg Ultrafiltration Rate (mL/min): 889 mL/min Dialysate Flow Rate (mL/min): 300 ml/min Dialysis Fluid Bolus: Normal Saline Bolus Amount (mL): 100 mL   Patient still has significant amount of lower extremity edema.  Volume removal with dialysis as tolerated.  IV albumin supplementation.  Objective:  Vital signs in last 24 hours:  Temp:  [97.8 F (36.6 C)-99.7 F (37.6 C)] 98 F (36.7 C) (10/07 0834) Pulse Rate:  [62-87] 84 (10/07 1115) Resp:  [13-29] 19 (10/07 1115) BP: (86-124)/(35-100) 105/50 (10/07 1115) SpO2:  [92 %-100 %] 100 % (10/07 1115) Weight:  [77.4 kg] 77.4 kg (10/07 0800)  Weight change:  Filed Weights   02/01/22 2250 02/02/22 0825 02/04/22 0800  Weight: 83.2 kg 81.1 kg 77.4 kg    Intake/Output: I/O last 3 completed shifts: In: 766 [I.V.:266; IV Piggyback:500] Out: 401 [Stool:401]   Intake/Output this shift:  No intake/output data recorded.  Physical Exam: General: NAD, ill appearing   Head: Normocephalic, atraumatic. Moist oral mucosal membranes  Eyes: Anicteric  Lungs:  Diminished in bases, normal effort, room air  Heart: Regular rate and rhythm  Abdomen:  Soft, nontender  Extremities:  3+  peripheral edema up to abd wall.  Neurologic: Nonfocal, moving all four extremities  Skin: No lesions  Access: Rt chest permcath    Basic Metabolic Panel: Recent Labs  Lab 02/01/22 1152 02/02/22 0420 02/03/22 0704 02/04/22 0422  NA 130* 133* 133* 131*  K 3.6 3.6 3.0* 3.0*  CL 99 100 99 100  CO2 21* 18* 26 25  GLUCOSE 169* 145* 140* 126*  BUN 49* 54* 35* 46*  CREATININE 4.22* 4.60* 3.10* 4.00*  CALCIUM 8.2* 8.3* 8.1* 7.8*  MG  --  1.8 1.6* 2.0  PHOS  --  4.2 2.9 3.0     Liver Function Tests: Recent Labs  Lab 02/01/22 1152 02/02/22 0420 02/03/22 0704 02/04/22 0422  AST 67* 78* 82* 59*  ALT 38 38 42 36  ALKPHOS 84 63 69 85  BILITOT 2.0* 2.1* 2.5* 1.4*  PROT 5.0* 4.8* 4.6* 4.3*  ALBUMIN 2.2* 2.2* 2.0* 1.7*  1.8*    No results for input(s): "LIPASE", "AMYLASE" in the last 168 hours. Recent Labs  Lab 02/01/22 1723  AMMONIA 30     CBC: Recent Labs  Lab 01/30/22 0000 02/01/22 1153 02/02/22 0420 02/02/22 1819 02/03/22 0704 02/04/22 0422  WBC 9.5 6.8 13.4*  --  14.9* 11.6*  NEUTROABS 6.20 5.8  --   --   --   --   HGB 7.4* 7.7* 6.6* 7.4* 8.1* 7.4*  HCT 23* 24.0* 19.9* 22.4* 24.0* 22.3*  MCV  --  98.0 95.7  --  92.0 92.5  PLT 102* 126* 101*  --  123* 120*     Cardiac Enzymes: No results for input(s): "CKTOTAL", "CKMB", "CKMBINDEX", "TROPONINI" in the last 168 hours.  BNP: Invalid input(s): "POCBNP"  CBG: No results for input(s): "GLUCAP" in the last 168 hours.  Microbiology: Results for orders placed or performed during the hospital encounter of 02/01/22  Culture, blood (Routine x 2)     Status: None (Preliminary result)   Collection Time: 02/01/22 11:53 AM   Specimen: BLOOD  Result Value Ref Range Status   Specimen Description  BLOOD LEFT ANTECUBITAL  Final   Special Requests   Final    BOTTLES DRAWN AEROBIC AND ANAEROBIC Blood Culture results may not be optimal due to an excessive volume of blood received in culture bottles   Culture   Final    NO GROWTH 3 DAYS Performed at [facility], [address] [place], [place] [address]    Report Status PENDING  Incomplete  Culture, blood (Routine x 2)     Status: None (Preliminary result)   Collection Time: 02/01/22  1:16 PM   Specimen: BLOOD  Result Value Ref Range Status   Specimen Description BLOOD BLOOD LEFT FOREARM  Final   Special Requests BLOOD LEFT ARM Blood Culture adequate volume  Final   Culture   Final    NO GROWTH 3 DAYS Performed at [facility], [address], [place], [place] [address]    Report Status PENDING  Incomplete  Resp Panel by RT-PCR (Flu A&B, Covid) Anterior Nasal Swab     Status: None   Collection Time: 02/01/22  1:17 PM   Specimen: Anterior Nasal Swab  Result Value Ref Range Status   SARS Coronavirus 2 by RT PCR NEGATIVE NEGATIVE Final    Comment: (NOTE) SARS-CoV-2 target nucleic acids are NOT DETECTED.  The SARS-CoV-2 RNA is generally detectable in upper respiratory specimens during the acute phase of infection. The lowest concentration of SARS-CoV-2 viral copies this assay can detect is 138 copies/mL. A negative result does not preclude SARS-Cov-2 infection and should not be used as the sole basis for treatment or other patient management decisions. A negative result may occur with  improper specimen collection/handling, submission of specimen other than nasopharyngeal swab, presence of viral mutation(s) within the areas targeted by this assay, and inadequate number of viral copies(<138 copies/mL). A negative result must be combined with clinical observations, patient history, and epidemiological information. The expected result is Negative.  Fact Sheet for Patients:   EntrepreneurPulse.com.au  Fact Sheet for Healthcare Providers:  IncredibleEmployment.be  This test is no t yet approved or cleared by the [place] FDA and  has been authorized for detection and/or diagnosis of SARS-CoV-2 by FDA under an Emergency Use Authorization (EUA). This EUA will remain  in effect (meaning this test can be used) for the duration of the COVID-19 declaration under Section 564(b)(1) of the Act, 21 U.S.C.section 360bbb-3(b)(1), unless the authorization is terminated  or revoked sooner.       Influenza A by PCR NEGATIVE NEGATIVE Final   Influenza B by PCR NEGATIVE NEGATIVE Final    Comment: (NOTE) The Xpert Xpress SARS-CoV-2/FLU/RSV plus assay is intended as an aid in the diagnosis of influenza from Nasopharyngeal swab specimens and should not be used as a sole basis for treatment. Nasal washings and aspirates are unacceptable for Xpert Xpress SARS-CoV-2/FLU/RSV testing.  Fact Sheet for Patients: EntrepreneurPulse.com.au  Fact Sheet for Healthcare Providers:  IncredibleEmployment.be  This test is not yet approved or cleared by the [place] and has been authorized for detection and/or diagnosis of SARS-CoV-2 by FDA under an Emergency Use Authorization (EUA). This EUA will remain in effect (meaning this test can be used) for the duration of the COVID-19 declaration under Section 564(b)(1) of the Act, 21 U.S.C. section 360bbb-3(b)(1), unless the authorization is terminated or revoked.  Performed at [facility], [address] [place], [place] [address]   Peritoneal fluid culture w Gram Stain     Status: None (Preliminary result)   Collection Time: 02/01/22  4:59 PM   Specimen: Peritoneal Washings; Peritoneal Fluid  Result Value Ref Range Status   Specimen Description   Final    PERITONEAL Performed at [facility], [address] [place], [place]  [address]    Special Requests   Final    NONE Performed at [facility], [place], [place] [address]    Gram Stain NO ORGANISMS SEEN NO WBC SEEN   Final   Culture   Final    NO GROWTH 3 DAYS Performed at [facility], [place] [address], [place], [place] [address]    Report Status PENDING  Incomplete  MRSA Next Gen by PCR, Nasal     Status: None   Collection Time: 02/01/22 10:53 PM   Specimen: Nasal Mucosa; Nasal Swab  Result Value Ref Range Status   MRSA by PCR Next Gen NOT DETECTED NOT DETECTED Final    Comment: (NOTE) The GeneXpert MRSA Assay (FDA approved for NASAL specimens only), is one component of a comprehensive MRSA colonization surveillance program. It is not intended to diagnose MRSA infection nor to guide or monitor treatment for MRSA infections. Test performance is not FDA approved in patients less than 8 years old. Performed at [facility], [address] [place], [place] [address]   C Difficile Quick Screen w PCR reflex     Status: None   Collection Time: 02/02/22  5:58 PM   Specimen: STOOL  Result Value Ref Range Status   C Diff antigen NEGATIVE NEGATIVE Final   C Diff toxin NEGATIVE NEGATIVE Final   C Diff interpretation No C. difficile detected.  Final    Comment: Performed at [facility], [address] [place],  [address]  Gastrointestinal Panel by PCR , Stool     Status: None   Collection Time: 02/02/22  5:58 PM   Specimen: Stool  Result Value Ref Range Status   Campylobacter species NOT DETECTED NOT DETECTED Final   Plesimonas shigelloides NOT DETECTED NOT DETECTED Final   Salmonella species NOT DETECTED NOT DETECTED Final   Yersinia enterocolitica NOT DETECTED NOT DETECTED Final   Vibrio species NOT DETECTED NOT DETECTED Final   Vibrio cholerae NOT DETECTED NOT DETECTED Final   Enteroaggregative E coli (EAEC) NOT DETECTED NOT DETECTED Final   Enteropathogenic E coli (EPEC) NOT DETECTED NOT  DETECTED Final   Enterotoxigenic E coli (ETEC) NOT DETECTED NOT DETECTED Final   Shiga like toxin producing E coli (STEC) NOT DETECTED NOT DETECTED Final   Shigella/Enteroinvasive E coli (EIEC) NOT DETECTED NOT DETECTED Final   Cryptosporidium NOT DETECTED NOT DETECTED Final   Cyclospora cayetanensis NOT DETECTED NOT DETECTED Final   Entamoeba histolytica NOT DETECTED NOT DETECTED Final   Giardia lamblia NOT DETECTED NOT DETECTED Final   Adenovirus F40/41 NOT DETECTED NOT DETECTED Final   Astrovirus NOT DETECTED NOT DETECTED Final  Norovirus GI/GII NOT DETECTED NOT DETECTED Final   Rotavirus A NOT DETECTED NOT DETECTED Final   Sapovirus (I, II, IV, and V) NOT DETECTED NOT DETECTED Final    Comment: Performed at [facility], [address], [place], [place] [address]  Peritoneal fluid culture w Gram Stain     Status: None (Preliminary result)   Collection Time: 02/03/22  2:42 PM   Specimen: Peritoneal Washings; Peritoneal Fluid  Result Value Ref Range Status   Specimen Description   Final    PERITONEAL Performed at [facility], [address] [place], [place] [address]    Special Requests   Final    NONE Performed at [facility], [place], [place] [address]    Gram Stain NO WBC SEEN NO ORGANISMS SEEN   Final   Culture   Final    NO GROWTH < 12 HOURS Performed at [facility], [place] [address] [place],  [address]    Report Status PENDING  Incomplete    Coagulation Studies: Recent Labs    02/01/22 1152  LABPROT 15.5*  INR 1.2     Urinalysis: No results for input(s): "COLORURINE", "LABSPEC", "PHURINE", "GLUCOSEU", "HGBUR", "BILIRUBINUR", "KETONESUR", "PROTEINUR", "UROBILINOGEN", "NITRITE", "LEUKOCYTESUR" in the last 72 hours.  Invalid input(s): "APPERANCEUR"    Imaging: No results found.   Medications:    sodium chloride Stopped (02/02/22 0400)   albumin human     albumin human     norepinephrine  (LEVOPHED) Adult infusion 1 mcg/min (02/04/22 0400)    Chlorhexidine Gluconate Cloth  6 each Topical Q0600   Chlorhexidine Gluconate Cloth  6 each Topical Q0600   docusate sodium  100 mg Oral BID   feeding supplement (PRO-STAT SUGAR FREE 64)  30 mL Oral TID WC   lidocaine  1 patch Transdermal Q24H   midodrine  10 mg Oral TID WC   multivitamin  1 tablet Oral QHS   pantoprazole (PROTONIX) IV  40 mg Intravenous BID   rifaximin  200 mg Oral BID   acetaminophen **OR** acetaminophen, albumin human, diclofenac Sodium, heparin, ipratropium-albuterol, nitroGLYCERIN, ondansetron **OR** ondansetron (ZOFRAN) IV  Assessment/ Plan:  April [name] is a 81 y.o.  female with past medical history including liver cirrhosis, anemia, diverticulitis, hypertension, CHF, CAD, and end stage renal disease on hemodialysis. She presents to the emergency department with weakness and vomiting. She has been admitted for Lactic acidosis [E87.20] Abdominal distension [R14.0] Severe sepsis (HCC) [A41.9, R65.20] Hypotension, unspecified hypotension type [I95.9] Cirrhosis of liver with ascites, unspecified hepatic cirrhosis type ([place]) [K74.60, R18.8] Other hypervolemia [E87.79]  [facility]/TTS/right chest PermCath  End stage renal disease on hemodialysis with volume overload. Will maintain outpatient schedule during this admission.  Volume removal with dialysis as tolerated with IV albumin.  2. Anemia of chronic kidney disease  Lab Results  Component Value Date   HGB 7.4 (L) 02/04/2022  Reports recent history of dark stools.  Requiring blood transfusions this admission  3. 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 Calcium and phosphorus within acceptable range.  4. Hypotension due to suspected sepsis. Workup ongoing. Empirical antibiotics ordered by primary team.  Receiving Levo for blood pressure support.    LOS: [place] 10/7/202311:19 AM

## 2022-02-04 NOTE — Progress Notes (Signed)
PT Cancellation Note  Patient Details Name: April [name] MRN: [id number] DOB: [birth year]   Cancelled Treatment:    Reason Eval/Treat Not Completed: Medical issues which prohibited therapy (Chart reviewed. Hb down into 7s today. MAP <75mHg still. Pt currently being dialyzed. Will defer PT eval to later date/time.)  8:55 AM, 02/04/22 AEtta Grandchild PT, DPT Physical Therapist - [facility] [phone]([place]    Edwardine Deschepper C 02/04/2022, 8:55 AM

## 2022-02-04 NOTE — Progress Notes (Signed)
PT did 3.5 hours of HD, tolerated well w/no signs of distress. Albumin 25gram x1 given@ bedside. Report given to floor RN  Rp:113/39 Temp:98 Wt: 80.7kg Rr:16 PR:82 O2Sat 100% RA

## 2022-02-04 NOTE — Progress Notes (Signed)
OT Cancellation Note  Patient Details Name: April [name] MRN: [id number] DOB: [birth year]   Cancelled Treatment:    Reason Eval/Treat Not Completed: Patient at procedure or test/ unavailable. Pt currently receiving dialysis at bedside. Will re-attempt OT evaluation at later date/time as medically appropriate.   Ardeth Perfect., MPH, MS, OTR/L ascom [phone] 02/04/22, 8:54 AM

## 2022-02-04 NOTE — Progress Notes (Signed)
[facility] [place] NAME: April [name]    MR#:  [id number]  DATE OF BIRTH:  [birth year]  SUBJECTIVE:   son at bedside. Patient came from [place] after she started having generalized weakness, tremors and vomiting Missed dialysis on last Saturday. Got dialysis on Tuesday. Found to have hemoglobin of 6.6. Received one unit blood transfusion yday. Overall breathing stable. Has had extensive G.I. workup. Reamins on low dose levophed--trying to wean her down Getting HD and received IV albumin Tolerating po diet some No fever Had paracentesis--2.2.liters    VITALS:  Blood pressure 125/66, pulse 86, temperature 98 F (36.7 C), temperature source Oral, resp. rate (!) 23, height '4\' 9"'$  (1.448 m), weight 80.7 kg, last menstrual period 05/01/1972, SpO2 100 %.  PHYSICAL EXAMINATION:   GENERAL:  81 y.o.-year-old patient lying in the bed with no acute distress.  Chronically ill, weak Anasarca LUNGS: Normal breath sounds bilaterally, no wheezing CARDIOVASCULAR: S1, S2 normal. No murmurs,   ABDOMEN: Soft, nontender, distended. Bowel sounds present.  EXTREMITIES: chronic  edema b/l.    NEUROLOGIC: nonfocal  patient is alert and awake SKIN: per rn  LABORATORY PANEL:  CBC Recent Labs  Lab 02/04/22 0422  WBC 11.6*  HGB 7.4*  HCT 22.3*  PLT 120*     Chemistries  Recent Labs  Lab 02/04/22 0422  NA 131*  K 3.0*  CL 100  CO2 25  GLUCOSE 126*  BUN 46*  CREATININE 4.00*  CALCIUM 7.8*  MG 2.0  AST 59*  ALT 36  ALKPHOS 85  BILITOT 1.4*    Cardiac Enzymes No results for input(s): "TROPONINI" in the last 168 hours. RADIOLOGY:  No results found.  Assessment and Plan  [name] is an 81 year old female with history of liver cirrhosis, end-stage renal disease on hemodialysis Tuesday Thursday Saturday, hypertension, who presents emergency department for chief concerns of weakness and tremors.  CT abdomen pelvis without  contrast: Was read as cirrhotic liver with moderate volume ascites.  Small left and trace right pleural effusion with associated compressive atelectasis.  Moderate large hiatal hernia containing majority of the stomach within the chest.  Aortic atherosclerosis.  Anasarca.  Patient was hypotension was placed in the ICU currently on IV levophed. Lactic acid was elevated panel on empiric IV antibiotics. No fever.  Severe sepsis ([place]) - Etiology unclear -  procalcitonin 3.8, -- blood cultures x2 negative --peritoneal fluid--neg for peritonitis - on broad-spectrum antibiotic with vancomycin, cefepime, metronidazole--de-escalate to cefepime--if all cultures neg then will d/c abxs -  wean off Levophed   Anemia in ESRD (end-stage renal disease) ([place]) - came in hgb 7.4--6.6--1 unit BT today--8.1 --avoid all blood thinners including heparin   Anasarca ascites  Cirrhosis of liver (from radiologic studies)--pt to see Dr Riki Rusk as outpt--d/w dr Vicente Males H/o GI bleed with recent extensive w/u without obvious surce of bleeding H/o hemorrhoids - IR consulted for paracentesis - Albumin as needed ordered --10/6--s/p paracentesis 2.2liter --fluid neg for SBP--d/c abxs  Acute on Chronic Diastolic CHF --2D echo on 12/08/2021 showed EF of 55-60% with grade 2 diastolic dysfunction.  Patient has 1+ leg edema, but no JVD, no pulmonary edema on chest x-ray.   --will reusme home torsemide once bp stable -Fluid management per renal by dialysis   ESRD on dialysis [facility]) - Nephrology has been consulted, Dr. Candiss Norse - Strict I's and O's --UF with HD   H/o HTN (hypertension) Hypotension --cont midodrine and wean levophed as  tolerated   OSA on CPAP - CPAP nightly ordered   Paroxysmal atrial fibrillation (HCC) - Patient is not on anticoagulation due to extensive discussion in the past as she is concerned for bleeding - Amiodarone not continued on admission due to hypotension -  Currently NSR  Morbid obesity  ([place]) - This complicates overall care and prognosis.   H/o recent PE --not on any anticoagulation due to anemia requiring freq BT   Poor prognosis. Pt followed by Palliative care inpatient and out pt  DVT prophylaxis: SCD (gi bleed) Code Status: DNR Diet: Renal diet Family Communication: Updated son at bedside with patient's permission   Level of care: Stepdown Status is: Inpatient Remains inpatient appropriate because: hypotension    TOTAL TIME TAKING CARE OF THIS PATIENT: 35 minutes.  >50% time spent on counselling and coordination of care  Note: This dictation was prepared with Dragon dictation along with smaller phrase technology. Any transcriptional errors that result from this process are unintentional.  Fritzi Mandes M.D    [facility]   CC: Primary care physician; Einar Pheasant, MD

## 2022-02-04 NOTE — Progress Notes (Addendum)
NAME:  April [name], MRN:  [id number], DOB:  [birth year], LOS: 3 ADMISSION DATE:  02/01/2022, CONSULTATION DATE:  02/02/22 REFERRING MD:  Morton Amy, NP, CHIEF COMPLAINT:  Weakness, nausea & vomiting   History of Present Illness:  81 yo F presenting to [facility] ED on 02/01/22 from [place] facility for evaluation of weakness, emesis & tremors. She reports nausea and an episode of non bloody bilious emesis as well as weakness & fatigue.  She also confirms increased BLE swelling and weight gain. She confirms completing her iHD session on 10/3.  She admits to SOB, abdominal distention, BLE edema but reports these issues have been ongoing since March. She denies fever, but reports always being cold. She denies productive cough, chest pain, abdominal pain, diarrhea, syncope, falls, blurry vision. She denies tobacco [place], recreational drug use, ETOH use.  And confirms taking all medication as prescribed including her rifaximin & lactulose.  ED course: Upon arrival patient was hypotensive and sepsis protocol initiated. Diagnostic paracentesis performed and lab work sent. TRH consulted for admission Medications given: NS bolus 500 mL, cefepime/vancomycin/flagyl Initial Vitals: afebrile 98.8, mild tachypnea 20, HR - 89, hypotensive 87/50 & 94% on RA Significant labs: (Labs/ Imaging personally reviewed) I, Domingo Pulse [place], AGACNP-BC, personally viewed and interpreted this ECG. EKG Interpretation: Date: 02/01/22, EKG Time: 11:52, Rate: 85, Rhythm: LBBB, QRS Axis:  normal, Intervals: 1st degree HB, LBBB & prolonged QTc, ST/T Wave abnormalities: non specific T wave inversions, Narrative Interpretation: 1st degree HB & LBBB (unchanged from previous) Chemistry: Na+: 130, K+: 3.6, BUN/Cr.: 49/ 4.22, Serum CO2/ AG: 21/10, AST: 67 Hematology: WBC: 6.8, Hgb: 7.7, plt: 126  BNP: pending, Lactic/ PCT: 6.6 > 5.7 > 6.0/ 3.85, COVID-19 & Influenza A/B: negative  CXR 02/01/22: small bilateral effusions & bibasilar  atelectasis. Large hiatal hernia. CT abdomen/pelvis wo contrast 02/01/22: Cirrhotic liver with moderate volume ascites. Small left and trace right pleural effusions with associated compressive atelectasis. Moderate-large hiatal hernia containing the majority of the stomach within the chest. Left-sided colonic diverticulosis without evidence for acute diverticulitis. Anasarca.  Patient continues to have marginal blood pressures with SBP 100-90's and MAP in the 50's. She received a total of 750 mL of fluid due to anasarca & albumin post paracentesis. PCCM consulted for assistance in management and monitoring due to suspected sepsis with shock requiring peripheral vasopressor support.  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 Cirrhosis HFpEF CAD s/p PCI PAF (not on [place] due to GIB) Symptomatic anemia with recurrent GIB HTN HLD OSA  MICRO Data:  10/4: SARS-CoV-2 and influenza PCR>> negative 10/5: Acute hepatitis B viral panel>> nonreactive 10/4: Blood culture x2>> no growth to date 10/4: Diagnostic paracentesis>> no growth to date 10/4: MRSA PCR>> negative  Antimicrobials:  Cefepime 10/4>> Flagyl 10/4>> 10/5 Vancomycin 10/4>> 10/5  Significant Hospital Events: Including procedures, antibiotic start and stop dates in addition to other pertinent events   02/01/22: Admit to SDU with suspected sepsis s/t suspected SBP requiring peripheral vasopressor support. 02/02/22: Requiring low dose peripheral Levophed.  1 unit pRBC's given for Hgb   Tolerated HD. 02/03/22: Remains on low dose Levophed, but weaning down. Hgb stable at 8.1 s/p 1 unit pRBC yesterday. Plan for Paracentesis with IR today.  PT/OT consulted. 02/04/22:S/p paracentesis yesterday yielding 2.2L. Hemoglobin 7.4 down from 8.1  Interim History / Subjective:  -No significant events reported overnight -Afebrile, continues to require low dose peripheral Levophed (1 mcg), in process of weaning as tolerated,  on room air -S/P  Paracentesis with IR yielded 2.2L -Hgb remains stable at 7.4. Platelets stable at 120 -Leukocytosis improved  Objective   Blood pressure (!) 98/42, pulse 62, temperature 99.7 F (37.6 C), temperature source Oral, resp. rate 18, height '4\' 9"'$  (1.448 m), weight 81.1 kg, last menstrual period 05/01/1972, SpO2 95 %.        Intake/Output Summary (Last 24 hours) at 02/04/2022 0325 Last data filed at 02/03/2022 2000 Gross per 24 hour  Intake 561.86 ml  Output 401 ml  Net 160.86 ml    Filed Weights   02/01/22 1137 02/01/22 2250 02/02/22 0825  Weight: 77.1 kg 83.2 kg 81.1 kg    Examination: General: Adult female, acutely ill appearing, lying in bed, NAD HEENT: MM pink/moist, anicteric, atraumatic, neck supple Neuro: A&O x 4, able to follow commands, PERRL +3, MAE CV: s1s2 RRR, LBBB on monitor, no r/m/g Pulm: Regular, mild dyspnea on RA, breath sounds clear-BUL & diminished-BLL GI: soft, distended with hiatal hernia, no tenderness over para site, bs x 4 Skin:  no rashes/lesions noted Extremities: warm/dry, pulses + 2 R/P, +4 edema noted BLE  Resolved Hospital Problem list     Assessment & Plan:   Shock: Suspect Hypovolemic +/- Septic Chronic HFpEF Mildly elevated troponin, suspect demand ischemia PAF not on Anticoagulation (no AC due to concern for bleeding) PMHx: HTN Echocardiogram 01/11/22: LVEF 55-60%, indeterminate Diastolic parameters, RV function normal, + pulmonary hypertension, moderate mitral and tricuspid regurgitation -Continuous cardiac monitoring -Maintain MAP >60 -Vasopressors as needed to maintain MAP goal -Continue Midodrine -Trend lactic acid until normalized -Trend HS Troponin until peaked (22 ~ 32 ~) -Hold home antihypertensives -HD for volume removal  Severe Sepsis, unclear source currently, but suspected SBP -CT Abdomen & Pelvis on admission with cirrhosis and moderate volume ascites, large hiatal hernia -S/p Diagnostic Paracentesis on 10/4  in ED,  cultures currently with no growth -CXR on admission without obvious Pneumonia -GI panel & C. Diff PCR negative -Pt is anuric, unable to obtain UA -Monitor fever curve -Trend WBC's & Procalcitonin -Follow cultures as above -Continue empiric Cefepime pending cultures & sensitivities -S/p paracentesis 10/6  removed 2.2L  Anasarca Ascites Cirrhosis of liver PMHx: GI bleed without any obvious bleeding source -Trend LFT's and coags -IV Albumin as needed -IR consulted for Paracentesis, s/p paracentesis 10/6 -Continue Rifaximin   OSA -Supplemental O2 as needed to maintain O2 sats >90% -CPAP qhs -Follow intermittent Chest X-ray & ABG as needed -Bronchodilators as needed -Pulmonary toilet as able  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 -Monitor I&O's / urinary output -Follow BMP -Ensure adequate renal perfusion -Avoid nephrotoxic agents as able -Replace electrolytes as indicated -Nephrology following, appreciate input ~ HD as per Nephrology  Anemia of Chronic Disease PMHx: GI bleed with extensive w/u without obvious bleeding source, hemorrhoids -Monitor for S/Sx of bleeding -Trend CBC -SCD's for VTE Prophylaxis  -Transfuse for Hgb <7    Best Practice (right click and "Reselect all SmartList Selections" daily)  Diet/type: Regular consistency (see orders) DVT prophylaxis: SCD's (GI bleed) GI prophylaxis: PPI Lines: N/A Foley:  N/A Code Status:  DNR Last date of multidisciplinary goals of care discussion [02/03/2022]  Will update pt's family when they arrive at bedside.  Labs   CBC: Recent Labs  Lab 01/30/22 0000 02/01/22 1153 02/02/22 0420 02/02/22 1819 02/03/22 0704  WBC 9.5 6.8 13.4*  --  14.9*  NEUTROABS 6.20 5.8  --   --   --   HGB 7.4* 7.7* 6.6* 7.4* 8.1*  HCT 23* 24.0* 19.9*  22.4* 24.0*  MCV  --  98.0 95.7  --  92.0  PLT 102* 126* 101*  --  123*     Basic Metabolic Panel: Recent Labs  Lab 02/01/22 1152 02/02/22 0420 02/03/22 0704  NA 130* 133* 133*  K 3.6 3.6 3.0*   CL 99 100 99  CO2 21* 18* 26  GLUCOSE 169* 145* 140*  BUN 49* 54* 35*  CREATININE 4.22* 4.60* 3.10*  CALCIUM 8.2* 8.3* 8.1*  MG  --  1.8 1.6*  PHOS  --  4.2 2.9    GFR: Estimated Creatinine Clearance: 12.1 mL/min (A) (by C-G formula based on SCr of 3.1 mg/dL (H)). Recent Labs  Lab 01/30/22 0000 02/01/22 1152 02/01/22 1153 02/01/22 1442 02/01/22 1723 02/02/22 0420 02/03/22 0704  PROCALCITON  --   --   --   --  3.85  --  5.48  WBC 9.5  --  6.8  --   --  13.4* 14.9*  LATICACIDVEN  --  6.6*  --  5.7* 6.0*  --   --      Liver Function Tests: Recent Labs  Lab 02/01/22 1152 02/02/22 0420 02/03/22 0704  AST 67* 78* 82*  ALT 38 38 42  ALKPHOS 84 63 69  BILITOT 2.0* 2.1* 2.5*  PROT 5.0* 4.8* 4.6*  ALBUMIN 2.2* 2.2* 2.0*    No results for input(s): "LIPASE", "AMYLASE" in the last 168 hours. Recent Labs  Lab 02/01/22 1723  AMMONIA 30     ABG    Component Value Date/Time   PHART 7.6 (H) 10/18/2021 0313   PCO2ART 47 10/18/2021 0313   PO2ART 78 (L) 10/18/2021 0313   HCO3 46.1 (H) 10/18/2021 0313   TCO2 25 03/25/2016 1927   O2SAT 97.4 10/18/2021 0313     Coagulation Profile: Recent Labs  Lab 02/01/22 1152  INR 1.2     Cardiac Enzymes: No results for input(s): "CKTOTAL", "CKMB", "CKMBINDEX", "TROPONINI" in the last 168 hours.  HbA1C: Hgb A1c MFr Bld  Date/Time Value Ref Range Status  09/21/2021 01:33 PM 6.3 4.6 - 6.5 % Final    Comment:    Glycemic Control Guidelines for People with Diabetes:Non Diabetic:  <6%Goal of Therapy: <7%Additional Action Suggested:  >8%   01/20/2021 09:20 AM 6.2 4.6 - 6.5 % Final    Comment:    Glycemic Control Guidelines for People with Diabetes:Non Diabetic:  <6%Goal of Therapy: <7%Additional Action Suggested:  >8%     CBG: No results for input(s): "GLUCAP" in the last 168 hours.  Past Medical History:  She,  has a past medical history of Anemia, CAD S/P PCI pRCA Promus DES 3.5 x 16 (4.1 mm), ostRPDA Promus DES 2.5 x  12 (2.7 mm) (03/25/2016), CAD S/P percutaneous coronary angioplasty, CHF (congestive heart failure) ([place]), CKD (chronic kidney disease), stage III ([place]), Diabetes mellitus without complication ([place]), Diastolic dysfunction, Diverticulitis, Dyspnea, Endometriosis, Family history of adverse reaction to anesthesia, GERD (gastroesophageal reflux disease), GI bleed, Hiatal hernia, History of colon polyps (08/1994), History of Migraines, Hypercholesterolemia, Hypertension, LBBB (left bundle branch block), Osteoporosis, PAF (paroxysmal atrial fibrillation) ([place]), Rheumatic fever, Sleep apnea, Spasmodic dysphonia, ST elevation myocardial infarction (STEMI) of inferolateral wall, initial episode of care ([place]) (03/25/2016), ST elevation myocardial infarction (STEMI) of inferolateral wall, initial episode of care ([place]) (03/25/2016), and Urine incontinence.   Surgical History:   Past Surgical History:  Procedure Laterality Date   ABDOMINAL HYSTERECTOMY     ovaries not removed   APPENDECTOMY     was  removed during hysterectomy   Breast biopsies     x2   BREAST EXCISIONAL BIOPSY Bilateral "years ago"   neg   BREAST SURGERY Bilateral    BROW LIFT Bilateral 08/15/2019   Procedure: BLEPHAROPLASTY UPPER EYELID; W/EXCESS SKIN BLEPHAROPTOSIS REPAIR; RESECT EX;  Surgeon: Karle Starch, MD;  Location: [place];  Service: Ophthalmology;  Laterality: Bilateral;  sleep apnea   CARDIAC CATHETERIZATION  2011   moderate 40% RCA disease   CARDIAC CATHETERIZATION  01/2010   Dr. Gollan'@[facility]'$ : Only noted 40% RCA   CARDIAC CATHETERIZATION N/A 03/25/2016   Procedure: Left Heart Cath and Coronary Angiography;  Surgeon: 04/07/2016, MD;  Location: [place] CV LAB;  Service: Cardiovascular;  Laterality: N/A;   CARDIAC CATHETERIZATION N/A 03/25/2016   Procedure: Coronary Stent Intervention;  Surgeon: 04/07/2016, MD;  Location:  CV LAB;  Service: Cardiovascular;  Laterality: N/A;   COLONOSCOPY  2013    COLONOSCOPY WITH PROPOFOL N/A 07/11/2021   Procedure: COLONOSCOPY WITH PROPOFOL;  Surgeon: 07/24/2021, MD;  Location: [facility] ENDOSCOPY;  Service: Endoscopy;  Laterality: N/A;   DIALYSIS/PERMA CATHETER INSERTION N/A 12/15/2021   Procedure: DIALYSIS/PERMA CATHETER INSERTION;  Surgeon: 12/28/2021, MD;  Location: [place] CV LAB;  Service: Cardiovascular;  Laterality: N/A;   ESOPHAGOGASTRODUODENOSCOPY (EGD) WITH PROPOFOL N/A 03/17/2015   Procedure: ESOPHAGOGASTRODUODENOSCOPY (EGD) WITH PROPOFOL;  Surgeon: 03/30/2015, MD;  Location: [facility] ENDOSCOPY;  Service: Gastroenterology;  Laterality: N/A;   ESOPHAGOGASTRODUODENOSCOPY (EGD) WITH PROPOFOL N/A 07/10/2021   Procedure: ESOPHAGOGASTRODUODENOSCOPY (EGD) WITH PROPOFOL;  Surgeon: 16/03/2022, MD;  Location: [facility] ENDOSCOPY;  Service: Gastroenterology;  Laterality: N/A;   GIVENS CAPSULE STUDY N/A 07/11/2021   Procedure: GIVENS CAPSULE STUDY;  Surgeon: 07/24/2021, MD;  Location: [facility] ENDOSCOPY;  Service: Endoscopy;  Laterality: N/A;   NM MYOVIEW ([place] [facility]X)  07/2015   No evidence ischemia or infarction. EF 66%. Low risk   TEMPORARY DIALYSIS CATHETER N/A 12/08/2021   Procedure: TEMPORARY DIALYSIS CATHETER;  Surgeon: 21/01/2022, MD;  Location: [place] CV LAB;  Service: Cardiovascular;  Laterality: N/A;   TONSILLECTOMY     TRANSTHORACIC ECHOCARDIOGRAM  10/2013   Normal LV size and function. EF 55-65%. GR 1 DD. Otherwise normal.     Social History:   reports that she has never smoked. She has never used smokeless tobacco. She reports that she does not drink alcohol and does not use drugs.   Family History:  Her family history includes Arthritis in her mother; Breast cancer in an other family member; Heart attack in her daughter, father, and sister; Heart disease in her father; Hypertension in her mother; Osteoporosis in her mother; Stroke in her brother, daughter, and mother. There is no history of Colon cancer.    Allergies Allergies  Allergen Reactions   Penicillins Other (See Comments)    Okay to take amoxicillin/(pt does not recall what the reaction to penicillin was (44-2 years old)    Sulfa Antibiotics Rash     Home Medications  Prior to Admission medications   Medication Sig Start Date End Date Taking? Authorizing Provider  acetaminophen (TYLENOL) 325 MG tablet Take 650 mg by mouth every 4 (four) hours as needed for mild pain or fever.   Yes [provider]  Amino Acids-Protein Hydrolys (FEEDING SUPPLEMENT, PRO-STAT SUGAR FREE 64,) LIQD Take 30 mLs by mouth 3 (three) times daily with meals.   Yes [provider]  amiodarone (PACERONE) 200 MG tablet Take 1 tablet (  200 mg total) by mouth daily. 12/20/21  Yes Nolberto Hanlon, MD  docusate sodium (STOOL SOFTENER) 100 MG capsule Take 1 capsule (100 mg total) by mouth 2 (two) times daily. 12/19/21  Yes Nolberto Hanlon, MD  famotidine (PEPCID) 20 MG tablet Take 20 mg by mouth 2 (two) times daily.   Yes [provider]  lactulose (CHRONULAC) 10 GM/15ML solution Take 30 mLs (20 g total) by mouth 2 (two) times daily. 12/19/21  Yes Nolberto Hanlon, MD  rifaximin (XIFAXAN) 200 MG tablet Take 1 tablet (200 mg total) by mouth 2 (two) times daily. 12/19/21  Yes Nolberto Hanlon, MD  torsemide 40 MG TABS Take 40 mg by mouth 3 (three) times a week. 12/21/21  Yes Nolberto Hanlon, MD  albuterol (VENTOLIN HFA) 108 (90 Base) MCG/ACT inhaler Inhale 2 puffs into the lungs every 6 (six) hours as needed for wheezing or shortness of breath. 01/11/22   Shelly Coss, MD  diclofenac Sodium (VOLTAREN) 1 % GEL Apply 2 g topically 3 (three) times daily as needed (pain in legs and elbows). 12/19/21   Nolberto Hanlon, MD  ipratropium-albuterol (DUONEB) 0.5-2.5 (3) MG/3ML SOLN Take 3 mLs by nebulization every 6 (six) hours as needed. 01/11/22   Shelly Coss, MD  lidocaine (LIDODERM) 5 % Place 2 patches onto the skin daily. Remove & Discard patch within 12 hours or as  directed by MD Patient taking differently: Place 1 patch onto the skin daily. Remove & Discard patch within 12 hours PRN or as directed by MD 12/19/21   Nolberto Hanlon, MD  Multiple Vitamins-Minerals (MULTIVITAMIN WITH MINERALS) tablet Take 1 tablet by mouth daily.    [provider]  nitroGLYCERIN (NITROSTAT) 0.4 MG SL tablet Place 1 tablet (0.4 mg total) under the tongue every 5 (five) minutes as needed for chest pain. 12/19/21   Nolberto Hanlon, MD   Scheduled Meds:  sodium chloride   Intravenous Once   Chlorhexidine Gluconate Cloth  6 each Topical Q0600   Chlorhexidine Gluconate Cloth  6 each Topical Q0600   docusate sodium  100 mg Oral BID   feeding supplement (PRO-STAT SUGAR FREE 64)  30 mL Oral TID WC   lidocaine  1 patch Transdermal Q24H   midodrine  10 mg Oral TID WC   multivitamin  1 tablet Oral QHS   pantoprazole (PROTONIX) IV  40 mg Intravenous BID   rifaximin  200 mg Oral BID   Continuous Infusions:  sodium chloride Stopped (02/02/22 0400)   albumin human     albumin human     ceFEPime (MAXIPIME) IV 200 mL/hr at 02/03/22 2000   norepinephrine (LEVOPHED) Adult infusion 1 mcg/min (02/03/22 2216)   PRN Meds:.acetaminophen **OR** acetaminophen, albumin human, diclofenac Sodium, heparin, ipratropium-albuterol, nitroGLYCERIN, ondansetron **OR** ondansetron (ZOFRAN) IV   Critical care time: 35 minutes     Rufina Falco, DNP, CCRN, FNP-C, AGACNP-BC Acute Care & Family Nurse Practitioner  [facility]  See Amion for personal pager PCCM on call pager [phone] until 7 am

## 2022-02-04 NOTE — Progress Notes (Signed)
Notified Dr. Posey Pronto of ST elevation of 3.4 on telemetry. Patient asymptomatic.  Will obtain EKG. Patient also having dark brown/ black stools.  Patient states this is not new and she has been having black stools since prior to admit.

## 2022-02-04 NOTE — Progress Notes (Signed)
PHARMACY CONSULT NOTE  Pharmacy Consult for Electrolyte Monitoring and Replacement   Recent Labs: Potassium (mmol/L)  Date Value  02/04/2022 3.0 (L)  03/23/2014 3.9   Magnesium (mg/dL)  Date Value  02/04/2022 2.0   Calcium (mg/dL)  Date Value  02/04/2022 7.8 (L)   Albumin (g/dL)  Date Value  02/04/2022 1.7 (L)  02/04/2022 1.8 (L)   Phosphorus (mg/dL)  Date Value  02/04/2022 3.0   Sodium (mmol/L)  Date Value  02/04/2022 131 (L)  04/29/2018 147 (H)    Assessment: 81 y.o. female with medical history significant of ESRD-HD (TTS), HTN, hyperlipidemia, COPD, GERD, CAD, STEMI, DES stent placement, PAF not on anticoagulants due to GI bleeding, liver cirrhosis, left bundle blockade, migraine, dCHF, OSA on CPAP, who presents with Weakness, emesis, tremors. Pharmacy is asked to follow and replace electrolytes  Goal of Therapy:  Electrolytes within normal limits  Plan:  --Na 131, defer management to primary team --K 3, patient due for dialysis today. Defer correction to nephrology --Follow-up electrolytes with AM labs tomorrow  Benita Gutter 02/04/2022 7:41 AM

## 2022-02-05 DIAGNOSIS — L8990 Pressure ulcer of unspecified site, unspecified stage: Secondary | ICD-10-CM | POA: Insufficient documentation

## 2022-02-05 DIAGNOSIS — A419 Sepsis, unspecified organism: Secondary | ICD-10-CM | POA: Diagnosis not present

## 2022-02-05 DIAGNOSIS — R6520 Severe sepsis without septic shock: Secondary | ICD-10-CM | POA: Diagnosis not present

## 2022-02-05 LAB — CBC
HCT: 21.9 % — ABNORMAL LOW (ref 36.0–46.0)
Hemoglobin: 7.2 g/dL — ABNORMAL LOW (ref 12.0–15.0)
MCH: 30.8 pg (ref 26.0–34.0)
MCHC: 32.9 g/dL (ref 30.0–36.0)
MCV: 93.6 fL (ref 80.0–100.0)
Platelets: 107 10*3/uL — ABNORMAL LOW (ref 150–400)
RBC: 2.34 MIL/uL — ABNORMAL LOW (ref 3.87–5.11)
RDW: 19.3 % — ABNORMAL HIGH (ref 11.5–15.5)
WBC: 11.6 10*3/uL — ABNORMAL HIGH (ref 4.0–10.5)
nRBC: 0.2 % (ref 0.0–0.2)

## 2022-02-05 LAB — BODY FLUID CULTURE W GRAM STAIN
Culture: NO GROWTH
Gram Stain: NONE SEEN

## 2022-02-05 LAB — RENAL FUNCTION PANEL
Albumin: 2.1 g/dL — ABNORMAL LOW (ref 3.5–5.0)
Anion gap: 7 (ref 5–15)
BUN: 31 mg/dL — ABNORMAL HIGH (ref 8–23)
CO2: 27 mmol/L (ref 22–32)
Calcium: 8.2 mg/dL — ABNORMAL LOW (ref 8.9–10.3)
Chloride: 96 mmol/L — ABNORMAL LOW (ref 98–111)
Creatinine, Ser: 2.87 mg/dL — ABNORMAL HIGH (ref 0.44–1.00)
GFR, Estimated: 16 mL/min — ABNORMAL LOW (ref >60)
Glucose, Bld: 119 mg/dL — ABNORMAL HIGH (ref 70–99)
Phosphorus: 2.1 mg/dL — ABNORMAL LOW (ref 2.5–4.6)
Potassium: 3.3 mmol/L — ABNORMAL LOW (ref 3.5–5.1)
Sodium: 130 mmol/L — ABNORMAL LOW (ref 135–145)

## 2022-02-05 LAB — PARATHYROID HORMONE, INTACT (NO CA): PTH: 32 pg/mL (ref 15–65)

## 2022-02-05 LAB — PROCALCITONIN: Procalcitonin: 2.78 ng/mL

## 2022-02-05 MED ORDER — CHLORHEXIDINE GLUCONATE CLOTH 2 % EX PADS
6.0000 | MEDICATED_PAD | Freq: Every day | CUTANEOUS | Status: AC
  Administered 2022-02-05 – 2022-02-09 (×5): 6 via TOPICAL

## 2022-02-05 MED ORDER — EPOETIN ALFA 10000 UNIT/ML IJ SOLN
8000.0000 [IU] | INTRAMUSCULAR | Status: DC
  Administered 2022-02-07 – 2022-02-11 (×3): 8000 [IU] via INTRAVENOUS
  Filled 2022-02-05 (×2): qty 1

## 2022-02-05 MED ORDER — ALBUMIN HUMAN 25 % IV SOLN: 25.0000 g | Freq: Once | INTRAVENOUS | Status: DC

## 2022-02-05 MED ORDER — ALBUMIN HUMAN 25 % IV SOLN
25.0000 g | Freq: Once | INTRAVENOUS | Status: AC
  Administered 2022-02-06: 25 g via INTRAVENOUS

## 2022-02-05 MED ORDER — PANTOPRAZOLE SODIUM 40 MG PO TBEC
40.0000 mg | DELAYED_RELEASE_TABLET | Freq: Every day | ORAL | Status: DC
  Administered 2022-02-06 – 2022-02-10 (×5): 40 mg via ORAL
  Filled 2022-02-05 (×5): qty 1

## 2022-02-05 NOTE — TOC Progression Note (Signed)
Transition of Care [facility]) - Progression Note    Patient Details  Name: April [name] MRN: [id number] Date of Birth: 1941/04/30  Transition of [facility]) CM/SW [place], [place] Phone Number: 02/05/2022, 2:20 PM  Clinical Narrative:     CSW spoke with [facility] with [employer], advised pt is requesting bedside commode, Jasmine states can DME tomorrow morning. MD made aware.         Expected Discharge Plan and Services                                                 Social Determinants of Health (SDOH) Interventions    Readmission Risk Interventions    11/28/2021   11:17 AM 10/19/2021   12:27 PM  Readmission Risk Prevention Plan  Transportation Screening Complete Complete  PCP or Specialist Appt within 3-5 Days Complete Complete  HRI or Home Care Consult Complete Complete  Social Work Consult for [place] Planning/Counseling Complete Complete  Palliative Care Screening Not Applicable Complete  Medication Review Press photographer) Complete Complete

## 2022-02-05 NOTE — Progress Notes (Signed)
IV watch started alarming, went to check IV, flushes easily with no pain, and still draws back blood. Reset IV watch and will continue to monitor

## 2022-02-05 NOTE — Evaluation (Signed)
Occupational Therapy Evaluation Patient Details Name: April [name] MRN: [id number] DOB: 16-Jun-1940 Today's Date: 02/05/2022   History of Present Illness Pt is an 81 y/o F admitted on 02/01/22 after presenting to the ED with c/o weakness, N&V, & tremors. Pt is being treated for severe sepsis of unclear etiology. PMH: liver cirrhosis, ESRD on HD TTS, HTN   Clinical Impression   Chart reviewed, pt greeted in bed agreeable to OT evaluation. Co tx completed with PT. MD provided clearance for mobility, systolic BP goal met throughout. RN also present in room. PTA pt requires assist for ADL due to progressive weakness, plans to have 24/7 aids after discharge. Pt presents with deficits in strength, endurance, activity tolerance, all affecting safe and optimal ADL completion. Recommend HHOT following discharge. OT will continue to follow acutely.      Recommendations for follow up therapy are one component of a multi-disciplinary discharge planning process, led by the attending physician.  Recommendations may be updated based on patient status, additional functional criteria and insurance authorization.   Follow Up Recommendations  Home health OT    Assistance Recommended at Discharge Frequent or constant Supervision/Assistance  Patient can return home with the following A little help with walking and/or transfers;A lot of help with bathing/dressing/bathroom    Functional Status Assessment  Patient has had a recent decline in their functional status and demonstrates the ability to make significant improvements in function in a reasonable and predictable amount of time.  Equipment Recommendations  BSC/3in1    Recommendations for Other Services       Precautions / Restrictions Precautions Precautions: Fall Precaution Comments: low BP & MAP Restrictions Weight Bearing Restrictions: No      Mobility Bed Mobility Overal bed mobility: Needs Assistance Bed Mobility: Supine to Sit      Supine to sit: Min assist, HOB elevated          Transfers Overall transfer level: Needs assistance Equipment used: Rolling walker (2 wheels) Transfers: Sit to/from Stand Sit to Stand: Min guard                  Balance Overall balance assessment: Needs assistance Sitting-balance support: Feet supported, Bilateral upper extremity supported Sitting balance-Leahy Scale: Fair     Standing balance support: During functional activity, Bilateral upper extremity supported, Reliant on assistive device for balance Standing balance-Leahy Scale: Fair                             ADL either performed or assessed with clinical judgement   ADL Overall ADL's : Needs assistance/impaired Eating/Feeding: Set up;Sitting   Grooming: Wash/dry hands;Wash/dry face;Sitting;Set up           Upper Body Dressing : Minimal assistance;Sitting   Lower Body Dressing: Maximal assistance Lower Body Dressing Details (indicate cue type and reason): socks Toilet Transfer: Min guard;Ambulation;Rolling walker (2 wheels) Toilet Transfer Details (indicate cue type and reason): simulated to bedside chair         Functional mobility during ADLs: Min guard;Rolling walker (2 wheels) (household distances, in room)       Vision Patient Visual Report: No change from baseline       Perception     Praxis      Pertinent Vitals/Pain Pain Assessment Pain Assessment: No/denies pain     Hand Dominance     Extremity/Trunk Assessment Upper Extremity Assessment Upper Extremity Assessment: Generalized weakness   Lower Extremity Assessment Lower Extremity  Assessment: Generalized weakness       Communication Communication Communication: No difficulties   Cognition Arousal/Alertness: Awake/alert Behavior During Therapy: WFL for tasks assessed/performed Overall Cognitive Status: Within Functional Limits for tasks assessed                                        General Comments  MD cleared pt for mobility, RN present throughout    Exercises     Shoulder Instructions      Home Living Family/patient expects to be discharged to:: Private residence Living Arrangements: Alone Available Help at Discharge: Family;Personal care attendant;Available 24 hours/day Type of Home: House Home Access: Level entry     Home Layout: One level     Bathroom Shower/Tub: Occupational psychologist: Standard     Home Equipment: Grab bars - tub/shower;Rolling Environmental consultant (2 wheels);Rollator (4 wheels);Cane - single point;Shower seat   Additional Comments: 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 [employer]      Prior Functioning/Environment Prior Level of Function : Independent/Modified Independent             Mobility Comments: amb short household distances with rollator with supervision from aid ADLs Comments: ADLs with supervision/SET UP, assist for all IADLs, progressive weakness        OT Problem List: Decreased strength;Decreased activity tolerance;Impaired balance (sitting and/or standing);Decreased knowledge of use of DME or AE      OT Treatment/Interventions: Self-care/ADL training;Therapeutic activities;Therapeutic exercise;Energy conservation;Patient/family education    OT Goals(Current goals can be found in the care plan section) Acute Rehab OT Goals Patient Stated Goal: go home with therapy OT Goal Formulation: With patient/family Time For Goal Achievement: 02/19/22 Potential to Achieve Goals: Good ADL Goals Pt Will Perform Grooming: with supervision;sitting Pt Will Transfer to Toilet: with supervision Pt Will Perform Toileting - Clothing Manipulation and hygiene: with supervision  OT Frequency: Min 2X/week    Co-evaluation PT/OT/SLP Co-Evaluation/Treatment: Yes Reason for Co-Treatment: Complexity of the patient's impairments (multi-system involvement) PT goals addressed during session: Mobility/safety with mobility;Proper use of  DME;Balance OT goals addressed during session: ADL's and self-care      AM-PAC OT "6 Clicks" Daily Activity     Outcome Measure Help from another person eating meals?: None Help from another person taking care of personal grooming?: None Help from another person toileting, which includes using toliet, bedpan, or urinal?: A Little Help from another person bathing (including washing, rinsing, drying)?: A Lot Help from another person to put on and taking off regular upper body clothing?: A Little Help from another person to put on and taking off regular lower body clothing?: A Lot 6 Click Score: 18   End of Session Equipment Utilized During Treatment: Rolling walker (2 wheels) Nurse Communication: Mobility status  Activity Tolerance: Patient tolerated treatment well Patient left: in chair;with call bell/phone within reach;with nursing/sitter in room  OT Visit Diagnosis: Unsteadiness on feet (R26.81);Muscle weakness (generalized) (M62.81)                Time: 1040-1057 OT Time Calculation (min): 17 min Charges:  OT General Charges $OT Visit: 1 Visit OT Evaluation $OT Eval Moderate Complexity: 1 Mod  Shanon Payor, OTD OTR/L  02/05/22, 1:26 PM

## 2022-02-05 NOTE — Progress Notes (Signed)
[facility] [place] NAME: April [name]    MR#:  [id number]  DATE OF BIRTH:  [birth year]  SUBJECTIVE:   son at bedside. Patient came from [place] after she started having generalized weakness, tremors and vomiting  IV levophed d/ced. Patient's blood pressure is soft but she is asymptomatic. She ambulated in the room with physical therapy occupational therapy using a rolling walker. PO intake is improving.   VITALS:  Blood pressure 104/63, pulse 83, temperature 97.8 F (36.6 C), temperature source Oral, resp. rate (!) 23, height '4\' 9"'$  (1.448 m), weight 80.7 kg, last menstrual period 05/01/1972, SpO2 100 %.  PHYSICAL EXAMINATION:   GENERAL:  81 y.o.-year-old patient lying in the bed with no acute distress.  Chronically ill, weak Anasarca LUNGS: Normal breath sounds bilaterally, no wheezing CARDIOVASCULAR: S1, S2 normal. No murmurs,   ABDOMEN: Soft, nontender, distended. Bowel sounds present.  EXTREMITIES: chronic  edema b/l.    NEUROLOGIC: nonfocal  patient is alert and awake SKIN: per rn  LABORATORY PANEL:  CBC Recent Labs  Lab 02/05/22 0602  WBC 11.6*  HGB 7.2*  HCT 21.9*  PLT 107*     Chemistries  Recent Labs  Lab 02/04/22 0422 02/05/22 0602  NA 131* 130*  K 3.0* 3.3*  CL 100 96*  CO2 25 27  GLUCOSE 126* 119*  BUN 46* 31*  CREATININE 4.00* 2.87*  CALCIUM 7.8* 8.2*  MG 2.0  --   AST 59*  --   ALT 36  --   ALKPHOS 85  --   BILITOT 1.4*  --     Cardiac Enzymes No results for input(s): "TROPONINI" in the last 168 hours. RADIOLOGY:  No results found.  Assessment and Plan  April [name] is an 81 year old female with history of liver cirrhosis, end-stage renal disease on hemodialysis Tuesday Thursday Saturday, hypertension, who presents emergency department for chief concerns of weakness and tremors.  CT abdomen pelvis without contrast: Was read as cirrhotic liver with moderate volume ascites.  Small left  and trace right pleural effusion with associated compressive atelectasis.  Moderate large hiatal hernia containing majority of the stomach within the chest.  Aortic atherosclerosis.  Anasarca.  Patient was hypotension was placed in the ICU currently on IV levophed. Lactic acid was elevated panel on empiric IV antibiotics. No fever.  Severe sepsis ([place]) - Etiology unclear -  procalcitonin 3.8, -- blood cultures x2 negative --peritoneal fluid--neg for peritonitis - on broad-spectrum antibiotic with vancomycin, cefepime, metronidazole--de-escalate to cefepime--if all cultures neg then will d/c abxs -  weaned off Levophed   Anemia in ESRD (end-stage renal disease) ([place]) - came in hgb 7.4--6.6--1 unit BT --8.1--7.2 --avoid all blood thinners including heparin   Anasarca/ascites  Cirrhosis of liver (from radiologic studies)--pt to see Dr Riki Rusk as outpt--d/w dr Vicente Males H/o GI bleed with recent extensive w/u without obvious surce of bleeding H/o hemorrhoids - IR consulted for paracentesis - Albumin as needed ordered --10/6--s/p paracentesis 2.2liter --fluid neg for SBP--d/c abxs  Acute on Chronic Diastolic CHF --2D echo on 12/08/2021 showed EF of 55-60% with grade 2 diastolic dysfunction.  Patient has 1+ leg edema, but no JVD, no pulmonary edema on chest x-ray.   --will reusme home torsemide once bp stable -Fluid management per renal by dialysis   ESRD on dialysis [facility]) hypokalemia - Nephrology has been consulted, Dr. Candiss Norse - Strict I's and O's --UF with HD   H/o HTN (hypertension) Hypotension --cont midodrine  OSA on CPAP - CPAP nightly ordered  Paroxysmal atrial fibrillation (HCC) - Patient is not on anticoagulation due to extensive discussion in the past as she is concerned for bleeding -  Currently NSR  Morbid obesity ([place]) - complicates overall care and prognosis.   H/o recent PE --not on any anticoagulation due to anemia requiring freq BT   Poor prognosis. Pt  followed by Palliative care inpatient and out pt  Pt worked with PT/OT--HHPT recommended  DVT prophylaxis: SCD (gi bleed) Code Status: DNR Diet: Renal diet Family Communication: Updated son at bedside with patient's permission   Level of care: Telemetry Medical Status is: Inpatient Remains inpatient appropriate because: hypotension   EDD mon or tuesday  TOTAL TIME TAKING CARE OF THIS PATIENT: 35 minutes.  >50% time spent on counselling and coordination of care  Note: This dictation was prepared with Dragon dictation along with smaller phrase technology. Any transcriptional errors that result from this process are unintentional.  April [name] M.D    [facility]   CC: Primary care physician; Einar Pheasant, MD

## 2022-02-05 NOTE — Evaluation (Signed)
Physical Therapy Evaluation Patient Details Name: April [name] MRN: [id number] DOB: [birth year] Today's Date: 02/05/2022  History of Present Illness  Pt is an 82 y/o F admitted on 02/01/22 after presenting to the ED with c/o weakness, N&V, & tremors. Pt is being treated for severe sepsis of unclear etiology. PMH: liver cirrhosis, ESRD on HD TTS, HTN  Clinical Impression  MD cleared for pt to participate in PT & OT in setting of low BP & MAP, noting this is likely pt's new baseline. Pt received in bed, reporting she was ambulatory with rollator at her ILF home. Pt's son present for part of session & reports they have coordinated/plan to provide pt with 24 hr supervision at d/c. On this date, pt is able to complete bed mobility with HOB elevated & min assist. Pt also transfers STS & stand pivot to recliner with RW & min assist with cuing for safe hand placement. Due to stable BP pt ambulated in room with RW & CGA with chair follow. Pt very motivated to participate & get better. Anticipate pt can d/c home with HHPT & 24 hr assistance. Will continue to follow pt acutely to address balance, strengthening, and gait with LRAD.  Nurse present in room for entire session. BP checked in RUE in bed: 99/38 mmHg MAP 57 After transferring to recliner: 98/49 mmHg MAP 65   Recommendations for follow up therapy are one component of a multi-disciplinary discharge planning process, led by the attending physician.  Recommendations may be updated based on patient status, additional functional criteria and insurance authorization.  Follow Up Recommendations Home health PT      Assistance Recommended at Discharge Frequent or constant Supervision/Assistance  Patient can return home with the following  A little help with walking and/or transfers;A little help with bathing/dressing/bathroom;Assistance with cooking/housework;Assist for transportation;Help with stairs or ramp for entrance    Equipment Recommendations  None recommended by PT  Recommendations for Other Services       Functional Status Assessment Patient has had a recent decline in their functional status and demonstrates the ability to make significant improvements in function in a reasonable and predictable amount of time.     Precautions / Restrictions Precautions Precautions: Fall Precaution Comments: low BP & MAP Restrictions Weight Bearing Restrictions: No      Mobility  Bed Mobility Overal bed mobility: Needs Assistance Bed Mobility: Supine to Sit     Supine to sit: Min assist, HOB elevated     General bed mobility comments: extra time    Transfers Overall transfer level: Needs assistance Equipment used: Rolling walker (2 wheels) Transfers: Sit to/from Stand Sit to Stand: Min guard                Ambulation/Gait Ambulation/Gait assistance: Min guard Gait Distance (Feet): 20 Feet Assistive device: Rolling walker (2 wheels) Gait Pattern/deviations: Decreased step length - right, Decreased step length - left, Decreased stride length Gait velocity: decreased        Stairs            Wheelchair Mobility    Modified Rankin (Stroke Patients Only)       Balance Overall balance assessment: Needs assistance Sitting-balance support: Feet supported, Bilateral upper extremity supported Sitting balance-Leahy Scale: Fair     Standing balance support: During functional activity, Bilateral upper extremity supported, Reliant on assistive device for balance Standing balance-Leahy Scale: Fair  Pertinent Vitals/Pain Pain Assessment Pain Assessment: No/denies pain    Home Living Family/patient expects to be discharged to:: Private residence [facility] apartment) Living Arrangements: Alone Available Help at Discharge: Family;Personal care attendant;Available 24 hours/day (family has coordinated 24 hr care) Type of Home: House Home Access: Level entry        Home Layout: One level Home Equipment: Grab bars - tub/shower;Rolling Walker (2 wheels);Rollator (4 wheels);Cane - single point;Shower seat Additional Comments: 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 [place] Prior Level of Function : Independent/Modified Independent             Mobility Comments: pt was ambulating with rollator       Hand Dominance        Extremity/Trunk Assessment   Upper Extremity Assessment Upper Extremity Assessment: Generalized weakness    Lower Extremity Assessment Lower Extremity Assessment: Generalized weakness (BLE edema)       Communication   Communication: No difficulties  Cognition Arousal/Alertness: Awake/alert Behavior During Therapy: WFL for tasks assessed/performed Overall Cognitive Status: Within Functional Limits for tasks assessed                                 General Comments: sweet lady, motivated to mobilize        General Comments      Exercises     Assessment/Plan    PT Assessment Patient needs continued PT services  PT Problem List Decreased strength;Cardiopulmonary status limiting activity;Decreased activity tolerance;Decreased balance;Decreased mobility;Decreased knowledge of use of DME;Decreased skin integrity       PT Treatment Interventions DME instruction;Therapeutic exercise;Gait training;Balance training;Stair training;Neuromuscular re-education;Functional mobility training;Patient/family education;Therapeutic activities;Manual techniques;Modalities    PT Goals (Current goals can be found in the Care Plan section)  Acute Rehab PT Goals Patient Stated Goal: get better, go home PT Goal Formulation: With patient Time For Goal Achievement: 02/19/22 Potential to Achieve Goals: Fair    Frequency Min 2X/week     Co-evaluation PT/OT/SLP Co-Evaluation/Treatment: Yes Reason for Co-Treatment: Complexity of the patient's impairments (multi-system involvement) PT goals addressed during  session: Mobility/safety with mobility;Proper use of DME;Balance         AM-PAC PT "6 Clicks" Mobility  Outcome Measure Help needed turning from your back to your side while in a flat bed without using bedrails?: A Little Help needed moving from lying on your back to sitting on the side of a flat bed without using bedrails?: A Little Help needed moving to and from a bed to a chair (including a wheelchair)?: A Little Help needed standing up from a chair using your arms (e.g., wheelchair or bedside chair)?: A Little Help needed to walk in hospital room?: A Little Help needed climbing 3-5 steps with a railing? : A Lot 6 Click Score: 17    End of Session   Activity Tolerance: Patient tolerated treatment well Patient left: in chair (in care of nurse & OT) Nurse Communication: Mobility status PT Visit Diagnosis: Unsteadiness on feet (R26.81);Muscle weakness (generalized) (M62.81)    Time: 1040-1055 PT Time Calculation (min) (ACUTE ONLY): 15 min   Charges:   PT Evaluation $PT Eval High Complexity: 1 High          Lavone Nian, PT, DPT 02/05/22, 11:04 AM   Waunita Schooner 02/05/2022, 11:01 AM

## 2022-02-05 NOTE — Progress Notes (Signed)
PHARMACY CONSULT NOTE  Pharmacy Consult for Electrolyte Monitoring and Replacement   Recent Labs: Potassium (mmol/L)  Date Value  02/05/2022 3.3 (L)  03/23/2014 3.9   Magnesium (mg/dL)  Date Value  02/04/2022 2.0   Calcium (mg/dL)  Date Value  02/05/2022 8.2 (L)   Albumin (g/dL)  Date Value  02/05/2022 2.1 (L)   Phosphorus (mg/dL)  Date Value  02/05/2022 2.1 (L)   Sodium (mmol/L)  Date Value  02/05/2022 130 (L)  04/29/2018 147 (H)    Assessment: 81 y.o. female with medical history significant of ESRD-HD (TTS), HTN, hyperlipidemia, COPD, GERD, CAD, STEMI, DES stent placement, PAF not on anticoagulants due to GI bleeding, liver cirrhosis, left bundle blockade, migraine, dCHF, OSA on CPAP, who presents with Weakness, emesis, tremors. Pharmacy is asked to follow and replace electrolytes. On HD TTS.   Goal of Therapy:  Electrolytes within normal limits  Plan:  - No replacement at this time. Pt to get HD tomorrow.  --Na 130, defer management to primary team --K 3, patient due for dialysis today. Defer correction to nephrology --Follow-up electrolytes with AM labs tomorrow  Oswald Hillock, PharmD, BCPS 02/05/2022 9:24 AM

## 2022-02-05 NOTE — Progress Notes (Signed)
NAME:  April [name], MRN:  [id number], DOB:  04/01/1941, LOS: 4 ADMISSION DATE:  02/01/2022, CONSULTATION DATE:  02/02/22 REFERRING MD:  Morton Amy, NP, CHIEF COMPLAINT:  Weakness, nausea & vomiting   History of Present Illness:  81 yo F presenting to [name] ED on 02/01/22 from [place] facility for evaluation of weakness, emesis & tremors. She reports nausea and an episode of non bloody bilious emesis as well as weakness & fatigue.  She also confirms increased BLE swelling and weight gain. She confirms completing her iHD session on 10/3.  She admits to SOB, abdominal distention, BLE edema but reports these issues have been ongoing since March. She denies fever, but reports always being cold. She denies productive cough, chest pain, abdominal pain, diarrhea, syncope, falls, blurry vision. She denies tobacco [place], recreational drug use, ETOH use.  And confirms taking all medication as prescribed including her rifaximin & lactulose.  ED course: Upon arrival patient was hypotensive and sepsis protocol initiated. Diagnostic paracentesis performed and lab work sent. TRH consulted for admission Medications given: NS bolus 500 mL, cefepime/vancomycin/flagyl Initial Vitals: afebrile 98.8, mild tachypnea 20, HR - 89, hypotensive 87/50 & 94% on RA Significant labs: (Labs/ Imaging personally reviewed) I, Domingo Pulse [place], AGACNP-BC, personally viewed and interpreted this ECG. EKG Interpretation: Date: 02/01/22, EKG Time: 11:52, Rate: 85, Rhythm: LBBB, QRS Axis:  normal, Intervals: 1st degree HB, LBBB & prolonged QTc, ST/T Wave abnormalities: non specific T wave inversions, Narrative Interpretation: 1st degree HB & LBBB (unchanged from previous) Chemistry: Na+: 130, K+: 3.6, BUN/Cr.: 49/ 4.22, Serum CO2/ AG: 21/10, AST: 67 Hematology: WBC: 6.8, Hgb: 7.7, plt: 126  BNP: pending, Lactic/ PCT: 6.6 > 5.7 > 6.0/ 3.85, COVID-19 & Influenza A/B: negative  CXR 02/01/22: small bilateral effusions & bibasilar  atelectasis. Large hiatal hernia. CT abdomen/pelvis wo contrast 02/01/22: Cirrhotic liver with moderate volume ascites. Small left and trace right pleural effusions with associated compressive atelectasis. Moderate-large hiatal hernia containing the majority of the stomach within the chest. Left-sided colonic diverticulosis without evidence for acute diverticulitis. Anasarca.  Patient continues to have marginal blood pressures with SBP 100-90's and MAP in the 50's. She received a total of 750 mL of fluid due to anasarca & albumin post paracentesis. PCCM consulted for assistance in management and monitoring due to suspected sepsis with shock requiring peripheral vasopressor support.  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 Cirrhosis HFpEF CAD s/p PCI PAF (not on [place] due to GIB) Symptomatic anemia with recurrent GIB HTN HLD OSA  MICRO Data:  10/4: SARS-CoV-2 and influenza PCR>> negative 10/5: Acute hepatitis B viral panel>> nonreactive 10/4: Blood culture x2>> no growth to date 10/4: Diagnostic paracentesis>> no growth to date 10/4: MRSA PCR>> negative 10/6: Paracentesis>> no growth to date  Antimicrobials:  Cefepime 10/4>>10/7 Flagyl 10/4>> 10/5 Vancomycin 10/4>> 10/5  Significant Hospital Events: Including procedures, antibiotic start and stop dates in addition to other pertinent events   02/01/22: Admit to SDU with suspected sepsis s/t suspected SBP requiring peripheral vasopressor support. 02/02/22: Requiring low dose peripheral Levophed.  1 unit pRBC's given for Hgb   Tolerated HD. 02/03/22: Remains on low dose Levophed, but weaning down. Hgb stable at 8.1 s/p 1 unit pRBC yesterday. Plan for Paracentesis with IR today.  PT/OT consulted. 02/04/22:S/p paracentesis yesterday yielding 2.2L. Hemoglobin 7.4 down from 8.1 02/05/22: Continues to require low dose Levophed, in process of weaning.  No growth from culttures  Interim History / Subjective:  -No significant events reported  overnight -S/p HD yesterday -Afebrile, continues to require low dose peripheral Levophed (2 mcg), in process of weaning as tolerated ~ adjust MAP goal >60 or SBP >90 -on room air (CPAP overnight) -S/P Paracentesis with IR 10/6 yielded 2.2L ~ cultures with no growth to date -Hgb remains stable at 7.2 from 7.4;  Platelets decreased to 107 from 120, no bleeding reported -Leukocytosis unchanged at 11.6, Procalcitonin improved to 2.7 from 4.3  Objective   Blood pressure (!) 111/43, pulse 62, temperature 98.2 F (36.8 C), temperature source Oral, resp. rate 13, height '4\' 9"'$  (1.448 m), weight 80.7 kg, last menstrual period 05/01/1972, SpO2 100 %.        Intake/Output Summary (Last 24 hours) at 02/05/2022 0754 Last data filed at 02/05/2022 0354 Gross per 24 hour  Intake 324.03 ml  Output 2499 ml  Net -2174.97 ml    Filed Weights   02/02/22 0825 02/04/22 0800 02/04/22 1220  Weight: 81.1 kg 77.4 kg 80.7 kg    Examination: General: Adult female, acutely ill appearing, lying in bed, NAD HEENT: MM pink/moist, anicteric, atraumatic, neck supple Neuro: A&O x 4, able to follow commands, PERRL +3, MAE CV: s1s2 RRR, LBBB on monitor, no r/m/g Pulm: Regular, mild dyspnea on RA, breath sounds clear-BUL & diminished-BLL GI: soft, distended with hiatal hernia, no tenderness over para site, bs x 4 Skin:  no rashes/lesions noted Extremities: warm/dry, pulses + 2 R/P, +4 edema noted BLE  Resolved Hospital Problem list     Assessment & Plan:   Shock: Suspect Hypovolemic +/- Septic Chronic HFpEF Mildly elevated troponin, suspect demand ischemia PAF not on Anticoagulation (no AC due to concern for bleeding) PMHx: HTN Echocardiogram 01/11/22: LVEF 55-60%, indeterminate Diastolic parameters, RV function normal, + pulmonary hypertension, moderate mitral and tricuspid regurgitation -Continuous cardiac monitoring -Maintain MAP >60 or SBP >90 -Vasopressors as needed to maintain MAP goal -Continue  Midodrine -Trend lactic acid until normalized -Trend HS Troponin until peaked (22 ~ 32 ~) -Hold home antihypertensives -HD for volume removal  Severe Sepsis, unclear source currently, but suspected SBP -CT Abdomen & Pelvis on admission with cirrhosis and moderate volume ascites, large hiatal hernia -S/p Diagnostic Paracentesis on 10/4  in ED, cultures currently with no growth; Repeat Paracentesis with IR on 10/6, those cultures with no growth to date -CXR on admission without obvious Pneumonia -GI panel & C. 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 -Monitor I&O's / urinary output -Follow BMP -Ensure adequate renal perfusion -Avoid nephrotoxic agents as able -Replace electrolytes as indicated -Nephrology following, appreciate input ~ HD as per Nephrology  Anemia of Chronic Disease Thrombocytopenia PMHx: GI bleed with extensive w/u without obvious bleeding source, hemorrhoids -Monitor for S/Sx of bleeding -Trend CBC -SCD's for VTE Prophylaxis  -Transfuse for Hgb <7     Best Practice (right click and "Reselect all SmartList Selections" daily)  Diet/type: Regular consistency (see orders) DVT prophylaxis: SCD's (GI bleed, anemia) GI prophylaxis: PPI Lines: N/A Foley:  N/A Code Status:  DNR Last date of multidisciplinary goals of  care discussion [02/03/2022]  10/8: Pt updated at bedside.  All questions answered.  Labs   CBC: Recent Labs  Lab 01/30/22 0000 02/01/22 1153  02/02/22 0420 02/02/22 1819 02/03/22 0704 02/04/22 0422 02/05/22 0602  WBC 9.5 6.8 13.4*  --  14.9* 11.6* 11.6*  NEUTROABS 6.20 5.8  --   --   --   --   --   HGB 7.4* 7.7* 6.6* 7.4* 8.1* 7.4* 7.2*  HCT 23* 24.0* 19.9* 22.4* 24.0* 22.3* 21.9*  MCV  --  98.0 95.7  --  92.0 92.5 93.6  PLT 102* 126* 101*  --  123* 120* 107*     Basic Metabolic Panel: Recent Labs  Lab 02/01/22 1152 02/02/22 0420 02/03/22 0704 02/04/22 0422 02/05/22 0602  NA 130* 133* 133* 131* 130*  K 3.6 3.6 3.0* 3.0* 3.3*  CL 99 100 99 100 96*  CO2 21* 18* '26 25 27  '$ GLUCOSE 169* 145* 140* 126* 119*  BUN 49* 54* 35* 46* 31*  CREATININE 4.22* 4.60* 3.10* 4.00* 2.87*  CALCIUM 8.2* 8.3* 8.1* 7.8* 8.2*  MG  --  1.8 1.6* 2.0  --   PHOS  --  4.2 2.9 3.0 2.1*    GFR: Estimated Creatinine Clearance: 13 mL/min (A) (by C-G formula based on SCr of 2.87 mg/dL (H)). Recent Labs  Lab 02/01/22 1152 02/01/22 1153 02/01/22 1442 02/01/22 1723 02/02/22 0420 02/03/22 0704 02/04/22 0422 02/05/22 0602  PROCALCITON  --   --   --  3.85  --  5.48 4.31 2.78  WBC  --    < >  --   --  13.4* 14.9* 11.6* 11.6*  LATICACIDVEN 6.6*  --  5.7* 6.0*  --   --   --   --    < > = values in this interval not displayed.     Liver Function Tests: Recent Labs  Lab 02/01/22 1152 02/02/22 0420 02/03/22 0704 02/04/22 0422 02/05/22 0602  AST 67* 78* 82* 59*  --   ALT 38 38 42 36  --   ALKPHOS 84 63 69 85  --   BILITOT 2.0* 2.1* 2.5* 1.4*  --   PROT 5.0* 4.8* 4.6* 4.3*  --   ALBUMIN 2.2* 2.2* 2.0* 1.7*  1.8* 2.1*    No results for input(s): "LIPASE", "AMYLASE" in the last 168 hours. Recent Labs  Lab 02/01/22 1723  AMMONIA 30     ABG    Component Value Date/Time   PHART 7.6 (H) 10/18/2021 0313   PCO2ART 47 10/18/2021 0313   PO2ART 78 (L) 10/18/2021 0313   HCO3 46.1 (H) 10/18/2021 0313   TCO2 25 03/25/2016 1927   O2SAT 97.4 10/18/2021 0313     Coagulation Profile: Recent Labs  Lab 02/01/22 1152  INR 1.2      Cardiac Enzymes: No results for input(s): "CKTOTAL", "CKMB", "CKMBINDEX", "TROPONINI" in the last 168 hours.  HbA1C: Hgb A1c MFr Bld  Date/Time Value Ref Range Status  09/21/2021 01:33 PM 6.3 4.6 - 6.5 % Final    Comment:    Glycemic Control Guidelines for People with Diabetes:Non Diabetic:  <6%Goal of Therapy: <7%Additional Action Suggested:  >8%   01/20/2021 09:20 AM 6.2 4.6 - 6.5 % Final    Comment:    Glycemic Control Guidelines for People with Diabetes:Non Diabetic:  <6%Goal of Therapy: <7%Additional Action Suggested:  >8%     CBG: No results for input(s): "GLUCAP" in the last 168 hours.  Past Medical History:  She,  has a past medical history of Anemia, CAD S/P PCI pRCA Promus DES 3.5 x 16 (4.1 mm), ostRPDA Promus DES 2.5 x 12 (2.7 mm) (03/25/2016), CAD S/P percutaneous coronary angioplasty, CHF (congestive heart failure) ([place]), CKD (chronic kidney disease), stage III ([place]), Diabetes mellitus without complication ([place]), Diastolic dysfunction, Diverticulitis, Dyspnea, Endometriosis, Family history of adverse reaction to anesthesia, GERD (gastroesophageal reflux disease), GI bleed, Hiatal hernia, History of colon polyps (08/1994), History of Migraines, Hypercholesterolemia, Hypertension, LBBB (left bundle branch block), Osteoporosis, PAF (paroxysmal atrial fibrillation) ([place]), Rheumatic fever, Sleep apnea, Spasmodic dysphonia, ST elevation myocardial infarction (STEMI) of inferolateral wall, initial episode of care ([place]) (03/25/2016), ST elevation myocardial infarction (STEMI) of inferolateral wall, initial episode of care ([place]) (03/25/2016), and Urine incontinence.   Surgical History:   Past Surgical History:  Procedure Laterality Date   ABDOMINAL HYSTERECTOMY     ovaries not removed   APPENDECTOMY     was removed during hysterectomy   Breast biopsies     x2   BREAST EXCISIONAL BIOPSY Bilateral "years ago"   neg   BREAST SURGERY Bilateral    BROW LIFT Bilateral 08/15/2019    Procedure: BLEPHAROPLASTY UPPER EYELID; W/EXCESS SKIN BLEPHAROPTOSIS REPAIR; RESECT EX;  Surgeon: Karle Starch, MD;  Location: [facility];  Service: Ophthalmology;  Laterality: Bilateral;  sleep apnea   CARDIAC CATHETERIZATION  2011   moderate 40% RCA disease   CARDIAC CATHETERIZATION  01/2010   Dr. Gollan'@[facility]'$ : Only noted 40% RCA   CARDIAC CATHETERIZATION N/A 03/25/2016   Procedure: Left Heart Cath and Coronary Angiography;  Surgeon: Leonie Man, MD;  Location: [place] [facility] LAB;  Service: Cardiovascular;  Laterality: N/A;   CARDIAC CATHETERIZATION N/A 03/25/2016   Procedure: Coronary Stent Intervention;  Surgeon: Leonie Man, MD;  Location: [place] [facility] LAB;  Service: Cardiovascular;  Laterality: N/A;   COLONOSCOPY  2013   COLONOSCOPY WITH PROPOFOL N/A 07/11/2021   Procedure: COLONOSCOPY WITH PROPOFOL;  Surgeon: Lucilla Lame, MD;  Location: [facility] ENDOSCOPY;  Service: Endoscopy;  Laterality: N/A;   DIALYSIS/PERMA CATHETER INSERTION N/A 12/15/2021   Procedure: DIALYSIS/PERMA CATHETER INSERTION;  Surgeon: Algernon Huxley, MD;  Location: [place] [facility];  Service: Cardiovascular;  Laterality: N/A;   ESOPHAGOGASTRODUODENOSCOPY (EGD) WITH PROPOFOL N/A 03/17/2015   Procedure: ESOPHAGOGASTRODUODENOSCOPY (EGD) WITH PROPOFOL;  Surgeon: Christene Lye, MD;  Location: [facility] ENDOSCOPY;  Service: Gastroenterology;  Laterality: N/A;   ESOPHAGOGASTRODUODENOSCOPY (EGD) WITH PROPOFOL N/A 07/10/2021   Procedure: ESOPHAGOGASTRODUODENOSCOPY (EGD) WITH PROPOFOL;  Surgeon: Lin Landsman, MD;  Location: [facility] ENDOSCOPY;  Service: Gastroenterology;  Laterality: N/A;   GIVENS CAPSULE STUDY N/A 07/11/2021   Procedure: GIVENS CAPSULE STUDY;  Surgeon: Lucilla Lame, MD;  Location: [facility] ENDOSCOPY;  Service: Endoscopy;  Laterality: N/A;   NM MYOVIEW ([place] HX)  07/2015   No evidence ischemia or infarction. EF 66%. Low risk   TEMPORARY DIALYSIS CATHETER N/A 12/08/2021   Procedure: TEMPORARY  DIALYSIS CATHETER;  Surgeon: Algernon Huxley, MD;  Location: [place] [facility] LAB;  Service: Cardiovascular;  Laterality: N/A;   TONSILLECTOMY     TRANSTHORACIC ECHOCARDIOGRAM  10/2013   Normal LV size and function. EF 55-65%. GR 1 DD. Otherwise normal.     Social History:   reports that she has never smoked. She has never used smokeless tobacco. She reports that she does not drink alcohol and does not use drugs.   Family History:  Her family history includes Arthritis in her mother; Breast cancer in an other family member; Heart  attack in her daughter, father, and sister; Heart disease in her father; Hypertension in her mother; Osteoporosis in her mother; Stroke in her brother, daughter, and mother. There is no history of Colon cancer.   Allergies Allergies  Allergen Reactions   Penicillins Other (See Comments)    Okay to take amoxicillin/(pt does not recall what the reaction to penicillin was (70-53 years old)    Sulfa Antibiotics Rash     Home Medications  Prior to Admission medications   Medication Sig Start Date End Date Taking? Authorizing Provider  acetaminophen (TYLENOL) 325 MG tablet Take 650 mg by mouth every 4 (four) hours as needed for mild pain or fever.   Yes [provider]  Amino Acids-Protein Hydrolys (FEEDING SUPPLEMENT, PRO-STAT SUGAR FREE 64,) LIQD Take 30 mLs by mouth 3 (three) times daily with meals.   Yes [provider]  amiodarone (PACERONE) 200 MG tablet Take 1 tablet (200 mg total) by mouth daily. 12/20/21  Yes Nolberto Hanlon, MD  docusate sodium (STOOL SOFTENER) 100 MG capsule Take 1 capsule (100 mg total) by mouth 2 (two) times daily. 12/19/21  Yes Nolberto Hanlon, MD  famotidine (PEPCID) 20 MG tablet Take 20 mg by mouth 2 (two) times daily.   Yes [provider]  lactulose (CHRONULAC) 10 GM/15ML solution Take 30 mLs (20 g total) by mouth 2 (two) times daily. 12/19/21  Yes Nolberto Hanlon, MD  rifaximin (XIFAXAN) 200 MG tablet Take 1 tablet (200  mg total) by mouth 2 (two) times daily. 12/19/21  Yes Nolberto Hanlon, MD  torsemide 40 MG TABS Take 40 mg by mouth 3 (three) times a week. 12/21/21  Yes Nolberto Hanlon, MD  albuterol (VENTOLIN HFA) 108 (90 Base) MCG/ACT inhaler Inhale 2 puffs into the lungs every 6 (six) hours as needed for wheezing or shortness of breath. 01/11/22   Shelly Coss, MD  diclofenac Sodium (VOLTAREN) 1 % GEL Apply 2 g topically 3 (three) times daily as needed (pain in legs and elbows). 12/19/21   Nolberto Hanlon, MD  ipratropium-albuterol (DUONEB) 0.5-2.5 (3) MG/3ML SOLN Take 3 mLs by nebulization every 6 (six) hours as needed. 01/11/22   Shelly Coss, MD  lidocaine (LIDODERM) 5 % Place 2 patches onto the skin daily. Remove & Discard patch within 12 hours or as directed by MD Patient taking differently: Place 1 patch onto the skin daily. Remove & Discard patch within 12 hours PRN or as directed by MD 12/19/21   Nolberto Hanlon, MD  Multiple Vitamins-Minerals (MULTIVITAMIN WITH MINERALS) tablet Take 1 tablet by mouth daily.    [provider]  nitroGLYCERIN (NITROSTAT) 0.4 MG SL tablet Place 1 tablet (0.4 mg total) under the tongue every 5 (five) minutes as needed for chest pain. 12/19/21   Nolberto Hanlon, MD   Scheduled Meds:  Chlorhexidine Gluconate Cloth  6 each Topical Daily   docusate sodium  100 mg Oral BID   feeding supplement (PRO-STAT SUGAR FREE 64)  30 mL Oral TID WC   lidocaine  1 patch Transdermal Q24H   midodrine  10 mg Oral TID WC   multivitamin  1 tablet Oral QHS   pantoprazole (PROTONIX) IV  40 mg Intravenous BID   rifaximin  200 mg Oral BID   Continuous Infusions:  sodium chloride Stopped (02/02/22 0400)   albumin human     albumin human     norepinephrine (LEVOPHED) Adult infusion 2 mcg/min (02/05/22 0354)   PRN Meds:.acetaminophen **OR** acetaminophen, albumin human, diclofenac Sodium, heparin, ipratropium-albuterol, nitroGLYCERIN,  ondansetron **OR** ondansetron (ZOFRAN) IV, phenol   Critical  care time: 35 minutes     Darel Hong, AGACNP-BC [facility] Prefer epic messenger for cross cover needs If after hours, please call E-link

## 2022-02-05 NOTE — Progress Notes (Signed)
[facility]  ROUNDING NOTE   Subjective:   April [name] is a 81 year old female with past medical history including liver cirrhosis, anemia, diverticulitis, hypertension, CHF, CAD, and end stage renal disease on hemodialysis. She presents to the emergency department with weakness and vomiting. She has been admitted for Lactic acidosis [E87.20] Abdominal distension [R14.0] Severe sepsis ([place]) [A41.9, R65.20] Hypotension, unspecified hypotension type [I95.9] Cirrhosis of liver with ascites, unspecified hepatic cirrhosis type ([place]) [K74.60, R18.8] Other hypervolemia [E87.79]  This patient is known to our clinic and received outpatient dialysis treatments at [facility] on a TTS schedule,   2500 cc removed with HD yesterday Able to eat some dinner last night Currently on room air    Objective:  Vital signs in last 24 hours:  Temp:  [97.5 F (36.4 C)-98.3 F (36.8 C)] 98 F (36.7 C) (10/08 0800) Pulse Rate:  [61-87] 74 (10/08 0830) Resp:  [13-24] 19 (10/08 0830) BP: (98-125)/(35-100) 117/41 (10/08 0830) SpO2:  [94 %-100 %] 99 % (10/08 0830) Weight:  [80.7 kg] 80.7 kg (10/07 1220)  Weight change:  Filed Weights   02/02/22 0825 02/04/22 0800 02/04/22 1220  Weight: 81.1 kg 77.4 kg 80.7 kg    Intake/Output: I/O last 3 completed shifts: In: 377.1 [I.V.:364.1; IV Piggyback:13] Out: 2499 [Other:2499]   Intake/Output this shift:  Total I/O In: 30.8 [I.V.:30.8] Out: -   Physical Exam: General: NAD, ill appearing  Head: Normocephalic, atraumatic. Moist oral mucosal membranes  Eyes: Anicteric  Lungs:  Diminished in bases, normal effort, room air  Heart: Regular rate and rhythm  Abdomen:  Soft, nontender  Extremities:  3+  peripheral edema up to abd wall.  Neurologic: Nonfocal, moving all four extremities  Skin: No lesions  Access: Rt chest permcath    Basic Metabolic Panel: Recent Labs  Lab 02/01/22 1152 02/02/22 0420 02/03/22 0704 02/04/22 0422  02/05/22 0602  NA 130* 133* 133* 131* 130*  K 3.6 3.6 3.0* 3.0* 3.3*  CL 99 100 99 100 96*  CO2 21* 18* '26 25 27  '$ GLUCOSE 169* 145* 140* 126* 119*  BUN 49* 54* 35* 46* 31*  CREATININE 4.22* 4.60* 3.10* 4.00* 2.87*  CALCIUM 8.2* 8.3* 8.1* 7.8* 8.2*  MG  --  1.8 1.6* 2.0  --   PHOS  --  4.2 2.9 3.0 2.1*     Liver Function Tests: Recent Labs  Lab 02/01/22 1152 02/02/22 0420 02/03/22 0704 02/04/22 0422 02/05/22 0602  AST 67* 78* 82* 59*  --   ALT 38 38 42 36  --   ALKPHOS 84 63 69 85  --   BILITOT 2.0* 2.1* 2.5* 1.4*  --   PROT 5.0* 4.8* 4.6* 4.3*  --   ALBUMIN 2.2* 2.2* 2.0* 1.7*  1.8* 2.1*    No results for input(s): "LIPASE", "AMYLASE" in the last 168 hours. Recent Labs  Lab 02/01/22 1723  AMMONIA 30     CBC: Recent Labs  Lab 01/30/22 0000 02/01/22 1153 02/02/22 0420 02/02/22 1819 02/03/22 0704 02/04/22 0422 02/05/22 0602  WBC 9.5 6.8 13.4*  --  14.9* 11.6* 11.6*  NEUTROABS 6.20 5.8  --   --   --   --   --   HGB 7.4* 7.7* 6.6* 7.4* 8.1* 7.4* 7.2*  HCT 23* 24.0* 19.9* 22.4* 24.0* 22.3* 21.9*  MCV  --  98.0 95.7  --  92.0 92.5 93.6  PLT 102* 126* 101*  --  123* 120* 107*     Cardiac  Enzymes: No results for input(s): "CKTOTAL", "CKMB", "CKMBINDEX", "TROPONINI" in the last 168 hours.  BNP: Invalid input(s): "POCBNP"  CBG: No results for input(s): "GLUCAP" in the last 168 hours.  Microbiology: Results for orders placed or performed during the hospital encounter of 02/01/22  Culture, blood (Routine x 2)     Status: None (Preliminary result)   Collection Time: 02/01/22 11:53 AM   Specimen: BLOOD  Result Value Ref Range Status   Specimen Description BLOOD LEFT ANTECUBITAL  Final   Special Requests   Final    BOTTLES DRAWN AEROBIC AND ANAEROBIC Blood Culture results may not be optimal due to an excessive volume of blood received in culture bottles   Culture   Final    NO GROWTH 4 DAYS Performed at [facility], [address],  [place], [place] [address]    Report Status PENDING  Incomplete  Culture, blood (Routine x 2)     Status: None (Preliminary result)   Collection Time: 02/01/22  1:16 PM   Specimen: BLOOD  Result Value Ref Range Status   Specimen Description BLOOD BLOOD LEFT FOREARM  Final   Special Requests BLOOD LEFT ARM Blood Culture adequate volume  Final   Culture   Final    NO GROWTH 4 DAYS Performed at [facility], [address], [place], [place] [address]    Report Status PENDING  Incomplete  Resp Panel by RT-PCR (Flu A&B, Covid) Anterior Nasal Swab     Status: None   Collection Time: 02/01/22  1:17 PM   Specimen: Anterior Nasal Swab  Result Value Ref Range Status   SARS Coronavirus 2 by RT PCR NEGATIVE NEGATIVE Final    Comment: (NOTE) SARS-CoV-2 target nucleic acids are NOT DETECTED.  The SARS-CoV-2 RNA is generally detectable in upper respiratory specimens during the acute phase of infection. The lowest concentration of SARS-CoV-2 viral copies this assay can detect is 138 copies/mL. A negative result does not preclude SARS-Cov-2 infection and should not be used as the sole basis for treatment or other patient management decisions. A negative result may occur with  improper specimen collection/handling, submission of specimen other than nasopharyngeal swab, presence of viral mutation(s) within the areas targeted by this assay, and inadequate number of viral copies(<138 copies/mL). A negative result must be combined with clinical observations, patient history, and epidemiological information. The expected result is Negative.  Fact Sheet for Patients:  EntrepreneurPulse.com.au  Fact Sheet for Healthcare Providers:  IncredibleEmployment.be  This test is no t yet approved or cleared by the [place] FDA and  has been authorized for detection and/or diagnosis of SARS-CoV-2 by FDA under an Emergency Use Authorization (EUA). This EUA will  remain  in effect (meaning this test can be used) for the duration of the COVID-19 declaration under Section 564(b)(1) of the Act, 21 U.S.C.section 360bbb-3(b)(1), unless the authorization is terminated  or revoked sooner.       Influenza A by PCR NEGATIVE NEGATIVE Final   Influenza B by PCR NEGATIVE NEGATIVE Final    Comment: (NOTE) The Xpert Xpress SARS-CoV-2/FLU/RSV plus assay is intended as an aid in the diagnosis of influenza from Nasopharyngeal swab specimens and should not be used as a sole basis for treatment. Nasal washings and aspirates are unacceptable for Xpert Xpress SARS-CoV-2/FLU/RSV testing.  Fact Sheet for Patients: EntrepreneurPulse.com.au  Fact Sheet for Healthcare Providers: IncredibleEmployment.be  This test is not yet approved or cleared by the [place] FDA and has been authorized for detection and/or  diagnosis of SARS-CoV-2 by FDA under an Emergency Use Authorization (EUA). This EUA will remain in effect (meaning this test can be used) for the duration of the COVID-19 declaration under Section 564(b)(1) of the Act, 21 U.S.C. section 360bbb-3(b)(1), unless the authorization is terminated or revoked.  Performed at [facility], [address], [place], [place] [address]   Peritoneal fluid culture w Gram Stain     Status: None   Collection Time: 02/01/22  4:59 PM   Specimen: Peritoneal Washings; Peritoneal Fluid  Result Value Ref Range Status   Specimen Description   Final    PERITONEAL Performed at [facility], [address], [place], [place] [address]    Special Requests   Final    NONE Performed at [facility], [place], [place] [address]    Gram Stain NO ORGANISMS SEEN NO WBC SEEN   Final   Culture   Final    NO GROWTH 3 DAYS Performed at [facility], [place] [address] [place], [place] [address]    Report Status 02/05/2022 FINAL  Final  MRSA Next  Gen by PCR, Nasal     Status: None   Collection Time: 02/01/22 10:53 PM   Specimen: Nasal Mucosa; Nasal Swab  Result Value Ref Range Status   MRSA by PCR Next Gen NOT DETECTED NOT DETECTED Final    Comment: (NOTE) The GeneXpert MRSA Assay (FDA approved for NASAL specimens only), is one component of a comprehensive MRSA colonization surveillance program. It is not intended to diagnose MRSA infection nor to guide or monitor treatment for MRSA infections. Test performance is not FDA approved in patients less than 57 years old. Performed at [facility], [address] [place], [place] [address]   C Difficile Quick Screen w PCR reflex     Status: None   Collection Time: 02/02/22  5:58 PM   Specimen: STOOL  Result Value Ref Range Status   C Diff antigen NEGATIVE NEGATIVE Final   C Diff toxin NEGATIVE NEGATIVE Final   C Diff interpretation No C. difficile detected.  Final    Comment: Performed at [facility], [address] [place], [place] [address]  Gastrointestinal Panel by PCR , Stool     Status: None   Collection Time: 02/02/22  5:58 PM   Specimen: Stool  Result Value Ref Range Status   Campylobacter species NOT DETECTED NOT DETECTED Final   Plesimonas shigelloides NOT DETECTED NOT DETECTED Final   Salmonella species NOT DETECTED NOT DETECTED Final   Yersinia enterocolitica NOT DETECTED NOT DETECTED Final   Vibrio species NOT DETECTED NOT DETECTED Final   Vibrio cholerae NOT DETECTED NOT DETECTED Final   Enteroaggregative E coli (EAEC) NOT DETECTED NOT DETECTED Final   Enteropathogenic E coli (EPEC) NOT DETECTED NOT DETECTED Final   Enterotoxigenic E coli (ETEC) NOT DETECTED NOT DETECTED Final   Shiga like toxin producing E coli (STEC) NOT DETECTED NOT DETECTED Final   Shigella/Enteroinvasive E coli (EIEC) NOT DETECTED NOT DETECTED Final   Cryptosporidium NOT DETECTED NOT DETECTED Final   Cyclospora cayetanensis NOT DETECTED NOT DETECTED Final   Entamoeba  histolytica NOT DETECTED NOT DETECTED Final   Giardia lamblia NOT DETECTED NOT DETECTED Final   Adenovirus F40/41 NOT DETECTED NOT DETECTED Final   Astrovirus NOT DETECTED NOT DETECTED Final   Norovirus GI/GII NOT DETECTED NOT DETECTED Final   Rotavirus A NOT DETECTED NOT DETECTED Final   Sapovirus (I, II, IV,  and V) NOT DETECTED NOT DETECTED Final    Comment: Performed at [facility], [address] [place], [place] [address]  Peritoneal fluid culture w Gram Stain     Status: None (Preliminary result)   Collection Time: 02/03/22  2:42 PM   Specimen: Peritoneal Washings; Peritoneal Fluid  Result Value Ref Range Status   Specimen Description   Final    PERITONEAL Performed at [facility], [address], [place], [name] [address]    Special Requests   Final    NONE Performed at [facility], [place], [place] [address]    Gram Stain NO WBC SEEN NO ORGANISMS SEEN   Final   Culture   Final    NO GROWTH 2 DAYS Performed at [facility], [place] [address] [place], [place] [address]    Report Status PENDING  Incomplete    Coagulation Studies: No results for input(s): "LABPROT", "INR" in the last 72 hours.   Urinalysis: No results for input(s): "COLORURINE", "LABSPEC", "PHURINE", "GLUCOSEU", "HGBUR", "BILIRUBINUR", "KETONESUR", "PROTEINUR", "UROBILINOGEN", "NITRITE", "LEUKOCYTESUR" in the last 72 hours.  Invalid input(s): "APPERANCEUR"    Imaging: No results found.   Medications:    sodium chloride Stopped (02/02/22 0400)   albumin human     albumin human     norepinephrine (LEVOPHED) Adult infusion 2 mcg/min (02/05/22 0800)    Chlorhexidine Gluconate Cloth  6 each Topical Daily   docusate sodium  100 mg Oral BID   feeding supplement (PRO-STAT SUGAR FREE 64)  30 mL Oral TID WC   lidocaine  1 patch Transdermal Q24H   midodrine  10 mg Oral TID WC   multivitamin  1 tablet Oral QHS   pantoprazole (PROTONIX) IV  40 mg  Intravenous BID   rifaximin  200 mg Oral BID   acetaminophen **OR** acetaminophen, albumin human, diclofenac Sodium, heparin, ipratropium-albuterol, nitroGLYCERIN, ondansetron **OR** ondansetron (ZOFRAN) IV, phenol  Assessment/ Plan:  April [name] is a 81 y.o.  female with past medical history including liver cirrhosis, anemia, diverticulitis, hypertension, CHF, CAD, and end stage renal disease on hemodialysis. She presents to the emergency department with weakness and vomiting. She has been admitted for Lactic acidosis [E87.20] Abdominal distension [R14.0] Severe sepsis ([facility]) [A41.9, R65.20] Hypotension, unspecified hypotension type [I95.9] Cirrhosis of liver with ascites, unspecified hepatic cirrhosis type ([place]) [K74.60, R18.8] Other hypervolemia [E87.79]  [facility]/TTS/right chest PermCath  End stage renal disease on hemodialysis with generalized edema. Will maintain outpatient schedule during this admission.  Volume removal with dialysis as tolerated with IV albumin. Extra HD on Monday for volume removal  2. Anemia of chronic kidney disease  Lab Results  Component Value Date   HGB 7.2 (L) 02/05/2022  Reports recent history of dark stools.  Requiring blood transfusions this admission EPO with HD  3. 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 Calcium and phosphorus within acceptable range.  4. Hypotension due to suspected sepsis. Workup ongoing. Empirical antibiotics ordered by primary team. Receiving Levo for blood pressure support. Currently at 1 mcg  5. Hypokalemia Replace K as needed 4 K dialysis bath   LOS: 4 Kayleah Appleyard 10/8/20239:10 AM

## 2022-02-06 ENCOUNTER — Ambulatory Visit: Payer: Medicare Other | Admitting: Nurse Practitioner

## 2022-02-06 ENCOUNTER — Other Ambulatory Visit: Payer: Medicare Other

## 2022-02-06 DIAGNOSIS — R6520 Severe sepsis without septic shock: Secondary | ICD-10-CM | POA: Diagnosis not present

## 2022-02-06 DIAGNOSIS — A419 Sepsis, unspecified organism: Secondary | ICD-10-CM | POA: Diagnosis not present

## 2022-02-06 LAB — CULTURE, BLOOD (ROUTINE X 2)
Culture: NO GROWTH
Culture: NO GROWTH
Special Requests: ADEQUATE

## 2022-02-06 LAB — RENAL FUNCTION PANEL
Albumin: 1.9 g/dL — ABNORMAL LOW (ref 3.5–5.0)
Anion gap: 7 (ref 5–15)
BUN: 41 mg/dL — ABNORMAL HIGH (ref 8–23)
CO2: 28 mmol/L (ref 22–32)
Calcium: 8.1 mg/dL — ABNORMAL LOW (ref 8.9–10.3)
Chloride: 97 mmol/L — ABNORMAL LOW (ref 98–111)
Creatinine, Ser: 3.75 mg/dL — ABNORMAL HIGH (ref 0.44–1.00)
GFR, Estimated: 11 mL/min — ABNORMAL LOW (ref >60)
Glucose, Bld: 123 mg/dL — ABNORMAL HIGH (ref 70–99)
Phosphorus: 3 mg/dL (ref 2.5–4.6)
Potassium: 3.6 mmol/L (ref 3.5–5.1)
Sodium: 132 mmol/L — ABNORMAL LOW (ref 135–145)

## 2022-02-06 LAB — PATHOLOGIST SMEAR REVIEW

## 2022-02-06 MED ORDER — ALBUMIN HUMAN 25 % IV SOLN
INTRAVENOUS | Status: AC
  Filled 2022-02-06: qty 100

## 2022-02-06 NOTE — Progress Notes (Signed)
3.5hr UF only tx.  1.9l fluid removal.  Report given by CN.  84.0l processed.  Left in stable condition

## 2022-02-06 NOTE — Progress Notes (Signed)
[facility]  ROUNDING NOTE   Subjective:   April [name] is a 81 year old female with past medical history including liver cirrhosis, anemia, diverticulitis, hypertension, CHF, CAD, and end stage renal disease on hemodialysis. She presents to the emergency department with weakness and vomiting. She has been admitted for Lactic acidosis [E87.20] Abdominal distension [R14.0] Severe sepsis ([facility]) [A41.9, R65.20] Hypotension, unspecified hypotension type [I95.9] Cirrhosis of liver with ascites, unspecified hepatic cirrhosis type ([place]) [K74.60, R18.8] Other hypervolemia [E87.79]  This patient is known to our clinic and received outpatient dialysis treatments at [facility] on a TTS schedule,    Update:  Patient seen and evaluated at bedside in ICU Alert and oriented Large volume of peripheral edema remains Remains on room air  Objective:  Vital signs in last 24 hours:  Temp:  [97.7 F (36.5 C)-97.8 F (36.6 C)] 97.8 F (36.6 C) (10/09 0800) Pulse Rate:  [61-85] 85 (10/09 0800) Resp:  [15-23] 15 (10/09 0800) BP: (102-122)/(42-81) 116/42 (10/09 0800) SpO2:  [95 %-100 %] 95 % (10/09 0800)  Weight change:  Filed Weights   02/02/22 0825 02/04/22 0800 02/04/22 1220  Weight: 81.1 kg 77.4 kg 80.7 kg    Intake/Output: I/O last 3 completed shifts: In: 669.3 [P.O.:480; I.V.:189.3] Out: -    Intake/Output this shift:  Total I/O In: 240 [P.O.:240] Out: -   Physical Exam: General: NAD  Head: Normocephalic, atraumatic. Moist oral mucosal membranes  Eyes: Anicteric  Lungs:  Diminished in bases, normal effort, room air  Heart: Regular rate and rhythm  Abdomen:  Soft, nontender  Extremities:  3+  peripheral edema up to abd wall.  Neurologic: Nonfocal, moving all four extremities  Skin: No lesions  Access: Rt chest permcath    Basic Metabolic Panel: Recent Labs  Lab 02/02/22 0420 02/03/22 0704 02/04/22 0422 02/05/22 0602 02/06/22 0415  NA 133* 133* 131*  130* 132*  K 3.6 3.0* 3.0* 3.3* 3.6  CL 100 99 100 96* 97*  CO2 18* '26 25 27 28  '$ GLUCOSE 145* 140* 126* 119* 123*  BUN 54* 35* 46* 31* 41*  CREATININE 4.60* 3.10* 4.00* 2.87* 3.75*  CALCIUM 8.3* 8.1* 7.8* 8.2* 8.1*  MG 1.8 1.6* 2.0  --   --   PHOS 4.2 2.9 3.0 2.1* 3.0     Liver Function Tests: Recent Labs  Lab 02/01/22 1152 02/02/22 0420 02/03/22 0704 02/04/22 0422 02/05/22 0602 02/06/22 0415  AST 67* 78* 82* 59*  --   --   ALT 38 38 42 36  --   --   ALKPHOS 84 63 69 85  --   --   BILITOT 2.0* 2.1* 2.5* 1.4*  --   --   PROT 5.0* 4.8* 4.6* 4.3*  --   --   ALBUMIN 2.2* 2.2* 2.0* 1.7*  1.8* 2.1* 1.9*    No results for input(s): "LIPASE", "AMYLASE" in the last 168 hours. Recent Labs  Lab 02/01/22 1723  AMMONIA 30     CBC: Recent Labs  Lab 02/01/22 1153 02/02/22 0420 02/02/22 1819 02/03/22 0704 02/04/22 0422 02/05/22 0602  WBC 6.8 13.4*  --  14.9* 11.6* 11.6*  NEUTROABS 5.8  --   --   --   --   --   HGB 7.7* 6.6* 7.4* 8.1* 7.4* 7.2*  HCT 24.0* 19.9* 22.4* 24.0* 22.3* 21.9*  MCV 98.0 95.7  --  92.0 92.5 93.6  PLT 126* 101*  --  123* 120* 107*     Cardiac  Enzymes: No results for input(s): "CKTOTAL", "CKMB", "CKMBINDEX", "TROPONINI" in the last 168 hours.  BNP: Invalid input(s): "POCBNP"  CBG: No results for input(s): "GLUCAP" in the last 168 hours.  Microbiology: Results for orders placed or performed during the hospital encounter of 02/01/22  Culture, blood (Routine x 2)     Status: None   Collection Time: 02/01/22 11:53 AM   Specimen: BLOOD  Result Value Ref Range Status   Specimen Description BLOOD LEFT ANTECUBITAL  Final   Special Requests   Final    BOTTLES DRAWN AEROBIC AND ANAEROBIC Blood Culture results may not be optimal due to an excessive volume of blood received in culture bottles   Culture   Final    NO GROWTH 5 DAYS Performed at [facility], [address] [place], [place] [address]    Report Status 02/06/2022 FINAL   Final  Culture, blood (Routine x 2)     Status: None   Collection Time: 02/01/22  1:16 PM   Specimen: BLOOD  Result Value Ref Range Status   Specimen Description BLOOD BLOOD LEFT FOREARM  Final   Special Requests BLOOD LEFT ARM Blood Culture adequate volume  Final   Culture   Final    NO GROWTH 5 DAYS Performed at [facility], [address] [place], [place] [address]    Report Status 02/06/2022 FINAL  Final  Resp Panel by RT-PCR (Flu A&B, Covid) Anterior Nasal Swab     Status: None   Collection Time: 02/01/22  1:17 PM   Specimen: Anterior Nasal Swab  Result Value Ref Range Status   SARS Coronavirus 2 by RT PCR NEGATIVE NEGATIVE Final    Comment: (NOTE) SARS-CoV-2 target nucleic acids are NOT DETECTED.  The SARS-CoV-2 RNA is generally detectable in upper respiratory specimens during the acute phase of infection. The lowest concentration of SARS-CoV-2 viral copies this assay can detect is 138 copies/mL. A negative result does not preclude SARS-Cov-2 infection and should not be used as the sole basis for treatment or other patient management decisions. A negative result may occur with  improper specimen collection/handling, submission of specimen other than nasopharyngeal swab, presence of viral mutation(s) within the areas targeted by this assay, and inadequate number of viral copies(<138 copies/mL). A negative result must be combined with clinical observations, patient history, and epidemiological information. The expected result is Negative.  Fact Sheet for Patients:  EntrepreneurPulse.com.au  Fact Sheet for Healthcare Providers:  IncredibleEmployment.be  This test is no t yet approved or cleared by the [place] FDA and  has been authorized for detection and/or diagnosis of SARS-CoV-2 by FDA under an Emergency Use Authorization (EUA). This EUA will remain  in effect (meaning this test can be used) for the duration of  the COVID-19 declaration under Section 564(b)(1) of the Act, 21 U.S.C.section 360bbb-3(b)(1), unless the authorization is terminated  or revoked sooner.       Influenza A by PCR NEGATIVE NEGATIVE Final   Influenza B by PCR NEGATIVE NEGATIVE Final    Comment: (NOTE) The Xpert Xpress SARS-CoV-2/FLU/RSV plus assay is intended as an aid in the diagnosis of influenza from Nasopharyngeal swab specimens and should not be used as a sole basis for treatment. Nasal washings and aspirates are unacceptable for Xpert Xpress SARS-CoV-2/FLU/RSV testing.  Fact Sheet for Patients: EntrepreneurPulse.com.au  Fact Sheet for Healthcare Providers: IncredibleEmployment.be  This test is not yet approved or cleared by the [place] FDA and has been authorized for detection and/or diagnosis of  SARS-CoV-2 by FDA under an Emergency Use Authorization (EUA). This EUA will remain in effect (meaning this test can be used) for the duration of the COVID-19 declaration under Section 564(b)(1) of the Act, 21 U.S.C. section 360bbb-3(b)(1), unless the authorization is terminated or revoked.  Performed at [facility], [address], [place], [place] [address]   Peritoneal fluid culture w Gram Stain     Status: None   Collection Time: 02/01/22  4:59 PM   Specimen: Peritoneal Washings; Peritoneal Fluid  Result Value Ref Range Status   Specimen Description   Final    PERITONEAL Performed at [facility], [address] [place], [place] [address]    Special Requests   Final    NONE Performed at [facility], [place], [place] [address]    Gram Stain NO ORGANISMS SEEN NO WBC SEEN   Final   Culture   Final    NO GROWTH 3 DAYS Performed at [facility], [place] [address], [place], [place] [address]    Report Status 02/05/2022 FINAL  Final  MRSA Next Gen by PCR, Nasal     Status: None   Collection Time: 02/01/22 10:53 PM    Specimen: Nasal Mucosa; Nasal Swab  Result Value Ref Range Status   MRSA by PCR Next Gen NOT DETECTED NOT DETECTED Final    Comment: (NOTE) The GeneXpert MRSA Assay (FDA approved for NASAL specimens only), is one component of a comprehensive MRSA colonization surveillance program. It is not intended to diagnose MRSA infection nor to guide or monitor treatment for MRSA infections. Test performance is not FDA approved in patients less than 64 years old. Performed at [facility], [address] [place], [place] [address]   C Difficile Quick Screen w PCR reflex     Status: None   Collection Time: 02/02/22  5:58 PM   Specimen: STOOL  Result Value Ref Range Status   C Diff antigen NEGATIVE NEGATIVE Final   C Diff toxin NEGATIVE NEGATIVE Final   C Diff interpretation No C. difficile detected.  Final    Comment: Performed at [facility], [address] [place], [place] [address]  Gastrointestinal Panel by PCR , Stool     Status: None   Collection Time: 02/02/22  5:58 PM   Specimen: Stool  Result Value Ref Range Status   Campylobacter species NOT DETECTED NOT DETECTED Final   Plesimonas shigelloides NOT DETECTED NOT DETECTED Final   Salmonella species NOT DETECTED NOT DETECTED Final   Yersinia enterocolitica NOT DETECTED NOT DETECTED Final   Vibrio species NOT DETECTED NOT DETECTED Final   Vibrio cholerae NOT DETECTED NOT DETECTED Final   Enteroaggregative E coli (EAEC) NOT DETECTED NOT DETECTED Final   Enteropathogenic E coli (EPEC) NOT DETECTED NOT DETECTED Final   Enterotoxigenic E coli (ETEC) NOT DETECTED NOT DETECTED Final   Shiga like toxin producing E coli (STEC) NOT DETECTED NOT DETECTED Final   Shigella/Enteroinvasive E coli (EIEC) NOT DETECTED NOT DETECTED Final   Cryptosporidium NOT DETECTED NOT DETECTED Final   Cyclospora cayetanensis NOT DETECTED NOT DETECTED Final   Entamoeba histolytica NOT DETECTED NOT DETECTED Final   Giardia lamblia NOT  DETECTED NOT DETECTED Final   Adenovirus F40/41 NOT DETECTED NOT DETECTED Final   Astrovirus NOT DETECTED NOT DETECTED Final   Norovirus GI/GII NOT DETECTED NOT DETECTED Final   Rotavirus A NOT DETECTED NOT DETECTED Final   Sapovirus (I, II, IV, and V)  NOT DETECTED NOT DETECTED Final    Comment: Performed at [facility], [address] [place], [place] [address]  Peritoneal fluid culture w Gram Stain     Status: None (Preliminary result)   Collection Time: 02/03/22  2:42 PM   Specimen: Peritoneal Washings; Peritoneal Fluid  Result Value Ref Range Status   Specimen Description   Final    PERITONEAL Performed at [facility], [address] [place], [place] [address]    Special Requests   Final    NONE Performed at [facility], [place], [place] [address]    Gram Stain NO WBC SEEN NO ORGANISMS SEEN   Final   Culture   Final    NO GROWTH 2 DAYS Performed at [facility], [place] [address] [place],  [address]    Report Status PENDING  Incomplete    Coagulation Studies: No results for input(s): "LABPROT", "INR" in the last 72 hours.   Urinalysis: No results for input(s): "COLORURINE", "LABSPEC", "PHURINE", "GLUCOSEU", "HGBUR", "BILIRUBINUR", "KETONESUR", "PROTEINUR", "UROBILINOGEN", "NITRITE", "LEUKOCYTESUR" in the last 72 hours.  Invalid input(s): "APPERANCEUR"    Imaging: No results found.   Medications:    sodium chloride Stopped (02/02/22 0400)   albumin human     albumin human      Chlorhexidine Gluconate Cloth  6 each Topical Daily   docusate sodium  100 mg Oral BID   [START ON 02/07/2022] epoetin (EPOGEN/PROCRIT) injection  8,000 Units Intravenous Q T,Th,Sa-HD   feeding supplement (PRO-STAT SUGAR FREE 64)  30 mL Oral TID WC   lidocaine  1 patch Transdermal Q24H   midodrine  10 mg Oral TID WC   multivitamin  1 tablet Oral QHS   pantoprazole  40 mg Oral Daily   rifaximin  200 mg Oral BID    acetaminophen **OR** acetaminophen, albumin human, diclofenac Sodium, heparin, ipratropium-albuterol, nitroGLYCERIN, ondansetron **OR** ondansetron (ZOFRAN) IV, phenol  Assessment/ Plan:  April [name] is a 81 y.o.  female with past medical history including liver cirrhosis, anemia, diverticulitis, hypertension, CHF, CAD, and end stage renal disease on hemodialysis. She presents to the emergency department with weakness and vomiting. She has been admitted for Lactic acidosis [E87.20] Abdominal distension [R14.0] Severe sepsis ([facility]) [A41.9, R65.20] Hypotension, unspecified hypotension type [I95.9] Cirrhosis of liver with ascites, unspecified hepatic cirrhosis type ([place]) [K74.60, R18.8] Other hypervolemia [E87.79]  [facility] /TTS/right chest PermCath  End stage renal disease on hemodialysis with generalized edema. Will maintain outpatient schedule during this admission.  Will receive UF only treatment today with UF 2-2.5L as tolerated. Albumin ordered for BP support and to optimize fluid removal. Next treatment scheduled for Tuesday.  2. Anemia of chronic kidney disease  Lab Results  Component Value Date   HGB 7.2 (L) 02/05/2022  Reports recent history of dark stools.  Requiring blood transfusions this admission Continue EPO with HD  3. 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   4. Hypotension due to suspected sepsis. Workup ongoing. Empirical antibiotics ordered by primary team. Receiving Levo for blood pressure support. Currently at 1 mcg  5. Hypokalemia Corrected with dialysis   LOS: 5 Rosaria Kubin 10/9/202311:25 AM

## 2022-02-06 NOTE — TOC Progression Note (Signed)
Transition of Care [facility]) - Progression Note    Patient Details  Name: April [name] MRN: [id number] Date of Birth: July 01, 1940  Transition of Care [facility]) CM/SW [place], [place] Phone Number: 02/06/2022, 2:57 PM  Clinical Narrative:     Per [name] with [facility], patient requests their services for [facility]. Will need HH orders at time of discharge.        Expected Discharge Plan and Services                                                 Social Determinants of Health (SDOH) Interventions    Readmission Risk Interventions    11/28/2021   11:17 AM 10/19/2021   12:27 PM  Readmission Risk Prevention Plan  Transportation Screening Complete Complete  PCP or Specialist Appt within 3-5 Days Complete Complete  HRI or Home Care Consult Complete Complete  Social Work Consult for [place] Planning/Counseling Complete Complete  Palliative Care Screening Not Applicable Complete  Medication Review Press photographer) Complete Complete

## 2022-02-06 NOTE — Progress Notes (Signed)
[facility] [place] NAME: April [name]    MR#:  [id number]  DATE OF BIRTH:  Feb 22, 1941  SUBJECTIVE:   son at bedside.  IV levophed d/ced. Patient's blood pressure is soft but she is asymptomatic. She ambulated in the room with physical therapy occupational therapy using a rolling walker over the weeknd PO intake is improving. To be dialyzed today  VITALS:  Blood pressure (!) 117/49, pulse 75, temperature 97.6 F (36.4 C), temperature source Oral, resp. rate (!) 23, height '4\' 9"'$  (1.448 m), weight 80.7 kg, last menstrual period 05/01/1972, SpO2 98 %.  PHYSICAL EXAMINATION:   GENERAL:  81 y.o.-year-old patient lying in the bed with no acute distress.  Chronically ill, weak Anasarca LUNGS: Normal breath sounds bilaterally, no wheezing CARDIOVASCULAR: S1, S2 normal. No murmurs,   ABDOMEN: Soft, nontender, distended. Bowel sounds present.  EXTREMITIES: chronic  edema b/l.    NEUROLOGIC: nonfocal  patient is alert and awake SKIN: per rn  LABORATORY PANEL:  CBC Recent Labs  Lab 02/05/22 0602  WBC 11.6*  HGB 7.2*  HCT 21.9*  PLT 107*     Chemistries  Recent Labs  Lab 02/04/22 0422 02/05/22 0602 02/06/22 0415  NA 131*   < > 132*  K 3.0*   < > 3.6  CL 100   < > 97*  CO2 25   < > 28  GLUCOSE 126*   < > 123*  BUN 46*   < > 41*  CREATININE 4.00*   < > 3.75*  CALCIUM 7.8*   < > 8.1*  MG 2.0  --   --   AST 59*  --   --   ALT 36  --   --   ALKPHOS 85  --   --   BILITOT 1.4*  --   --    < > = values in this interval not displayed.    Cardiac Enzymes No results for input(s): "TROPONINI" in the last 168 hours. RADIOLOGY:  No results found.  Assessment and Plan  April [name] is an 81 year old female with history of liver cirrhosis, end-stage renal disease on hemodialysis Tuesday Thursday Saturday, hypertension, who presents emergency department for chief concerns of weakness and tremors.  CT abdomen pelvis without contrast: Was  read as cirrhotic liver with moderate volume ascites.  Small left and trace right pleural effusion with associated compressive atelectasis.  Moderate large hiatal hernia containing majority of the stomach within the chest.  Aortic atherosclerosis.  Anasarca.  Patient was hypotension was placed in the ICU currently on IV levophed. Lactic acid was elevated panel on empiric IV antibiotics. No fever.  Anasarca/ascites  Cirrhosis of liver (from radiologic studies)--pt to see Dr Riki Rusk as outpt--d/w dr Vicente Males H/o GI bleed with recent extensive w/u without obvious surce of bleeding H/o hemorrhoids - IR consulted for paracentesis - Albumin as needed ordered --10/6--s/p paracentesis 2.2liter --fluid neg for SBP--d/c abxs --cont HD for UF  Severe sepsis (April [name]) - Etiology unclear -  procalcitonin 3.8, -- blood cultures x2 negative --peritoneal fluid--neg for peritonitis - on broad-spectrum antibiotic with vancomycin, cefepime, metronidazole--de-escalate to cefepime--if all cultures neg then will d/c abxs -  weaned off Levophed  --sepsis resolved  Anemia in ESRD (end-stage renal disease) (April [name]) - came in hgb 7.4--6.6--1 unit BT --8.1--7.2 --avoid all blood thinners including heparin   Acute on Chronic Diastolic CHF --2D echo on 12/08/2021 showed EF of 55-60% with grade 2 diastolic dysfunction.  Patient has  1+ leg edema, but no JVD, no pulmonary edema on chest x-ray.   --will reusme home torsemide once bp stable -Fluid management per renal by dialysis   ESRD on dialysis [facility]) hypokalemia - Nephrology has been consulted, Dr. Candiss Norse - Strict I's and O's --UF with HD   H/o HTN (hypertension) Hypotension --cont midodrine    OSA on CPAP - CPAP nightly ordered  Paroxysmal atrial fibrillation (April [name]) - Patient is not on anticoagulation due to extensive discussion in the past as she is concerned for bleeding -  Currently NSR  Morbid obesity (April [name]) - complicates overall care and prognosis.    H/o recent PE --not on any anticoagulation due to anemia requiring freq BT   Poor prognosis. Pt followed by Palliative care inpatient and out pt  Pt worked with PT/OT--HHPT recommended  DVT prophylaxis: SCD (gi bleed) Code Status: DNR Diet: Renal diet Family Communication: Updated son at bedside with patient's permission   Level of care: Telemetry Medical Status is: Inpatient Remains inpatient appropriate because: hypotension   EDD mon or tuesday  TOTAL TIME TAKING CARE OF THIS PATIENT: 35 minutes.  >50% time spent on counselling and coordination of care  Note: This dictation was prepared with Dragon dictation along with smaller phrase technology. Any transcriptional errors that result from this process are unintentional.  Fritzi Mandes M.D    [facility]   CC: Primary care physician; Einar Pheasant, MD

## 2022-02-07 ENCOUNTER — Ambulatory Visit: Payer: Medicare Other

## 2022-02-07 DIAGNOSIS — Z992 Dependence on renal dialysis: Secondary | ICD-10-CM | POA: Diagnosis not present

## 2022-02-07 DIAGNOSIS — N186 End stage renal disease: Secondary | ICD-10-CM

## 2022-02-07 DIAGNOSIS — R601 Generalized edema: Secondary | ICD-10-CM

## 2022-02-07 DIAGNOSIS — D631 Anemia in chronic kidney disease: Secondary | ICD-10-CM

## 2022-02-07 DIAGNOSIS — I480 Paroxysmal atrial fibrillation: Secondary | ICD-10-CM

## 2022-02-07 LAB — BODY FLUID CULTURE W GRAM STAIN
Culture: NO GROWTH
Gram Stain: NONE SEEN

## 2022-02-07 LAB — CBC
HCT: 18 % — ABNORMAL LOW (ref 36.0–46.0)
Hemoglobin: 6.1 g/dL — ABNORMAL LOW (ref 12.0–15.0)
MCH: 31.8 pg (ref 26.0–34.0)
MCHC: 33.9 g/dL (ref 30.0–36.0)
MCV: 93.8 fL (ref 80.0–100.0)
Platelets: 105 10*3/uL — ABNORMAL LOW (ref 150–400)
RBC: 1.92 MIL/uL — ABNORMAL LOW (ref 3.87–5.11)
RDW: 19.8 % — ABNORMAL HIGH (ref 11.5–15.5)
WBC: 12.2 10*3/uL — ABNORMAL HIGH (ref 4.0–10.5)
nRBC: 0 % (ref 0.0–0.2)

## 2022-02-07 LAB — PREPARE RBC (CROSSMATCH)

## 2022-02-07 LAB — RENAL FUNCTION PANEL
Albumin: 2.3 g/dL — ABNORMAL LOW (ref 3.5–5.0)
Anion gap: 8 (ref 5–15)
BUN: 57 mg/dL — ABNORMAL HIGH (ref 8–23)
CO2: 26 mmol/L (ref 22–32)
Calcium: 7.9 mg/dL — ABNORMAL LOW (ref 8.9–10.3)
Chloride: 96 mmol/L — ABNORMAL LOW (ref 98–111)
Creatinine, Ser: 4.55 mg/dL — ABNORMAL HIGH (ref 0.44–1.00)
GFR, Estimated: 9 mL/min — ABNORMAL LOW (ref >60)
Glucose, Bld: 112 mg/dL — ABNORMAL HIGH (ref 70–99)
Phosphorus: 3.4 mg/dL (ref 2.5–4.6)
Potassium: 3.3 mmol/L — ABNORMAL LOW (ref 3.5–5.1)
Sodium: 130 mmol/L — ABNORMAL LOW (ref 135–145)

## 2022-02-07 LAB — HEMOGLOBIN AND HEMATOCRIT, BLOOD
HCT: 25.2 % — ABNORMAL LOW (ref 36.0–46.0)
Hemoglobin: 8.3 g/dL — ABNORMAL LOW (ref 12.0–15.0)

## 2022-02-07 MED ORDER — SODIUM CHLORIDE 0.9% IV SOLUTION: Freq: Once | INTRAVENOUS | Status: DC

## 2022-02-07 MED ORDER — HEPARIN SODIUM (PORCINE) 1000 UNIT/ML IJ SOLN
INTRAMUSCULAR | Status: AC
  Filled 2022-02-07: qty 10

## 2022-02-07 MED ORDER — ALBUMIN HUMAN 25 % IV SOLN
INTRAVENOUS | Status: AC
  Filled 2022-02-07: qty 100

## 2022-02-07 MED ORDER — EPOETIN ALFA 4000 UNIT/ML IJ SOLN
INTRAMUSCULAR | Status: AC
  Filled 2022-02-07: qty 2

## 2022-02-07 NOTE — Progress Notes (Signed)
OT Cancellation Note  Patient Details Name: AMEYAH BANGURA MRN: [id number] DOB: 1940-09-12   Cancelled Treatment:    Reason Eval/Treat Not Completed: Patient at procedure or test/ unavailable. Chart reviewed. Pt noted to be off the floor for HD, scheduled for blood transfusion this AM. Will re-attempt as pt available.   Dessie Coma, M.S. OTR/L  02/07/22, 10:18 AM  ascom [phone]

## 2022-02-07 NOTE — Progress Notes (Signed)
Pre HD RN assessment 

## 2022-02-07 NOTE — Progress Notes (Signed)
   02/07/22 1135  Vitals  Temp 97.9 F (36.6 C)  Temp Source Oral  BP (!) 123/47  MAP (mmHg) 70  BP Location Right Arm  BP Method Automatic  Patient Position (if appropriate) Lying  Pulse Rate 79  Pulse Rate Source Monitor  ECG Heart Rate 81  Resp (!) 21  Oxygen Therapy  SpO2 100 %  O2 Device Room Air  Patient Activity (if Appropriate) In bed  Pulse Oximetry Type Continuous  During Treatment Monitoring  Blood Flow Rate (mL/min) 200 mL/min  HD Safety Checks Performed Yes  Intra-Hemodialysis Comments Tx completed  Dialysis Fluid Bolus Normal Saline  Post Treatment  Dialyzer Clearance Lightly streaked  Duration of HD Treatment -hour(s) 3.5 hour(s)  Hemodialysis Intake (mL) 300 mL  Liters Processed 84  Fluid Removed 2000 mL  Tolerated HD Treatment Yes  Post-Hemodialysis Comments hd completed. no complications  Note  Observations 1UPRBC infused. no pt reaction noted.  Hemodialysis Catheter Right Internal jugular Double lumen Permanent (Tunneled)  Placement Date/Time: 12/15/21 1059   Time Out: Correct patient;Correct procedure;Correct site  Maximum sterile barrier precautions: Hand hygiene;Sterile gloves;Cap;Large sterile sheet;Mask;Sterile gown  Site Prep: Chlorhexidine (preferred)  Local Anes...  Site Condition No complications  Blue Lumen Status Heparin locked  Red Lumen Status Heparin locked  Purple Lumen Status N/A  Catheter fill solution Heparin 1000 units/ml  Catheter fill volume (Arterial) 2 cc  Catheter fill volume (Venous) 2.2  Dressing Type Transparent  Dressing Status Antimicrobial disc in place  Interventions New dressing  Drainage Description None  Dressing Change Due 02/09/22  Post treatment catheter status Capped and Clamped

## 2022-02-07 NOTE — TOC Progression Note (Signed)
Transition of Care [facility]) - Progression Note    Patient Details  Name: April [name] MRN: [id number] Date of Birth: 10/09/1940  Transition of Care [facility]) CM/SW [place], [place] Phone Number: 02/07/2022, 2:18 PM  Clinical Narrative:     The patient is going to receive HHPT and HHRN from [facility]. The patient is scheduled to discharge home tomorrow.       Expected Discharge Plan and Services        [facility] and HHPT from [facility].                                         Social Determinants of Health (SDOH) Interventions    Readmission Risk Interventions    11/28/2021   11:17 AM 10/19/2021   12:27 PM  Readmission Risk Prevention Plan  Transportation Screening Complete Complete  PCP or Specialist Appt within 3-5 Days Complete Complete  HRI or Home Care Consult Complete Complete  Social Work Consult for [place] Planning/Counseling Complete Complete  Palliative Care Screening Not Applicable Complete  Medication Review Press photographer) Complete Complete

## 2022-02-07 NOTE — Progress Notes (Signed)
[facility]  ROUNDING NOTE   Subjective:   April [name] is a 81 year old female with past medical history including liver cirrhosis, anemia, diverticulitis, hypertension, CHF, CAD, and end stage renal disease on hemodialysis. She presents to the emergency department with weakness and vomiting. She has been admitted for Lactic acidosis [E87.20] Abdominal distension [R14.0] Severe sepsis (HCC) [A41.9, R65.20] Hypotension, unspecified hypotension type [I95.9] Cirrhosis of liver with ascites, unspecified hepatic cirrhosis type ([place]) [K74.60, R18.8] Other hypervolemia [E87.79]  This patient is known to our clinic and received outpatient dialysis treatments at [facility] on a TTS schedule,    Update:  Patient seen and evaluated during dialysis   HEMODIALYSIS FLOWSHEET:  Blood Flow Rate (mL/min): 400 mL/min Arterial Pressure (mmHg): -130 mmHg Venous Pressure (mmHg): 150 mmHg TMP (mmHg): 3 mmHg Ultrafiltration Rate (mL/min): 765 mL/min Dialysate Flow Rate (mL/min): 300 ml/min Dialysis Fluid Bolus: Normal Saline Bolus Amount (mL): 100 mL  Tolerating treatment well Denies shortness of breath Lower extremity edema improving  Objective:  Vital signs in last 24 hours:  Temp:  [97.6 F (36.4 C)-98.6 F (37 C)] 98.6 F (37 C) (10/10 0755) Pulse Rate:  [64-95] 84 (10/10 1030) Resp:  [13-24] 19 (10/10 1030) BP: (89-123)/(34-70) 110/50 (10/10 1030) SpO2:  [95 %-99 %] 99 % (10/10 1030) Weight:  [77.5 kg-81.7 kg] 77.5 kg (10/10 0759)  Weight change:  Filed Weights   02/04/22 1220 02/06/22 1553 02/07/22 0759  Weight: 80.7 kg 81.7 kg 77.5 kg    Intake/Output: I/O last 3 completed shifts: In: 480 [P.O.:480] Out: 1.9 [Other:1.9]   Intake/Output this shift:  No intake/output data recorded.  Physical Exam: General: NAD  Head: Normocephalic, atraumatic. Moist oral mucosal membranes  Eyes: Anicteric  Lungs:  Diminished in bases, normal effort, room air  Heart:  Regular rate and rhythm  Abdomen:  Soft, nontender  Extremities:  3+  peripheral edema  Neurologic: Nonfocal, moving all four extremities  Skin: No lesions  Access: Rt chest permcath    Basic Metabolic Panel: Recent Labs  Lab 02/02/22 0420 02/03/22 0704 02/04/22 0422 02/05/22 0602 02/06/22 0415 02/07/22 0757  NA 133* 133* 131* 130* 132* 130*  K 3.6 3.0* 3.0* 3.3* 3.6 3.3*  CL 100 99 100 96* 97* 96*  CO2 18* '26 25 27 28 26  '$ GLUCOSE 145* 140* 126* 119* 123* 112*  BUN 54* 35* 46* 31* 41* 57*  CREATININE 4.60* 3.10* 4.00* 2.87* 3.75* 4.55*  CALCIUM 8.3* 8.1* 7.8* 8.2* 8.1* 7.9*  MG 1.8 1.6* 2.0  --   --   --   PHOS 4.2 2.9 3.0 2.1* 3.0 3.4     Liver Function Tests: Recent Labs  Lab 02/01/22 1152 02/02/22 0420 02/03/22 0704 02/04/22 0422 02/05/22 0602 02/06/22 0415 02/07/22 0757  AST 67* 78* 82* 59*  --   --   --   ALT 38 38 42 36  --   --   --   ALKPHOS 84 63 69 85  --   --   --   BILITOT 2.0* 2.1* 2.5* 1.4*  --   --   --   PROT 5.0* 4.8* 4.6* 4.3*  --   --   --   ALBUMIN 2.2* 2.2* 2.0* 1.7*  1.8* 2.1* 1.9* 2.3*    No results for input(s): "LIPASE", "AMYLASE" in the last 168 hours. Recent Labs  Lab 02/01/22 1723  AMMONIA 30     CBC: Recent Labs  Lab 02/01/22 1153 02/02/22 0420 02/02/22 1819  02/03/22 0704 02/04/22 0422 02/05/22 0602 02/07/22 0757  WBC 6.8 13.4*  --  14.9* 11.6* 11.6* 12.2*  NEUTROABS 5.8  --   --   --   --   --   --   HGB 7.7* 6.6* 7.4* 8.1* 7.4* 7.2* 6.1*  HCT 24.0* 19.9* 22.4* 24.0* 22.3* 21.9* 18.0*  MCV 98.0 95.7  --  92.0 92.5 93.6 93.8  PLT 126* 101*  --  123* 120* 107* 105*     Cardiac Enzymes: No results for input(s): "CKTOTAL", "CKMB", "CKMBINDEX", "TROPONINI" in the last 168 hours.  BNP: Invalid input(s): "POCBNP"  CBG: No results for input(s): "GLUCAP" in the last 168 hours.  Microbiology: Results for orders placed or performed during the hospital encounter of 02/01/22  Culture, blood (Routine x 2)      Status: None   Collection Time: 02/01/22 11:53 AM   Specimen: BLOOD  Result Value Ref Range Status   Specimen Description BLOOD LEFT ANTECUBITAL  Final   Special Requests   Final    BOTTLES DRAWN AEROBIC AND ANAEROBIC Blood Culture results may not be optimal due to an excessive volume of blood received in culture bottles   Culture   Final    NO GROWTH 5 DAYS Performed at [facility], [address] [place], [place] [address]    Report Status 02/06/2022 FINAL  Final  Culture, blood (Routine x 2)     Status: None   Collection Time: 02/01/22  1:16 PM   Specimen: BLOOD  Result Value Ref Range Status   Specimen Description BLOOD BLOOD LEFT FOREARM  Final   Special Requests BLOOD LEFT ARM Blood Culture adequate volume  Final   Culture   Final    NO GROWTH 5 DAYS Performed at [facility], [address] [place], [place] [address]    Report Status 02/06/2022 FINAL  Final  Resp Panel by RT-PCR (Flu A&B, Covid) Anterior Nasal Swab     Status: None   Collection Time: 02/01/22  1:17 PM   Specimen: Anterior Nasal Swab  Result Value Ref Range Status   SARS Coronavirus 2 by RT PCR NEGATIVE NEGATIVE Final    Comment: (NOTE) SARS-CoV-2 target nucleic acids are NOT DETECTED.  The SARS-CoV-2 RNA is generally detectable in upper respiratory specimens during the acute phase of infection. The lowest concentration of SARS-CoV-2 viral copies this assay can detect is 138 copies/mL. A negative result does not preclude SARS-Cov-2 infection and should not be used as the sole basis for treatment or other patient management decisions. A negative result may occur with  improper specimen collection/handling, submission of specimen other than nasopharyngeal swab, presence of viral mutation(s) within the areas targeted by this assay, and inadequate number of viral copies(<138 copies/mL). A negative result must be combined with clinical observations, patient history, and  epidemiological information. The expected result is Negative.  Fact Sheet for Patients:  EntrepreneurPulse.com.au  Fact Sheet for Healthcare Providers:  IncredibleEmployment.be  This test is no t yet approved or cleared by the [place] FDA and  has been authorized for detection and/or diagnosis of SARS-CoV-2 by FDA under an Emergency Use Authorization (EUA). This EUA will remain  in effect (meaning this test can be used) for the duration of the COVID-19 declaration under Section 564(b)(1) of the Act, 21 U.S.C.section 360bbb-3(b)(1), unless the authorization is terminated  or revoked sooner.       Influenza A by PCR NEGATIVE NEGATIVE Final   Influenza B by PCR NEGATIVE  NEGATIVE Final    Comment: (NOTE) The Xpert Xpress SARS-CoV-2/FLU/RSV plus assay is intended as an aid in the diagnosis of influenza from Nasopharyngeal swab specimens and should not be used as a sole basis for treatment. Nasal washings and aspirates are unacceptable for Xpert Xpress SARS-CoV-2/FLU/RSV testing.  Fact Sheet for Patients: EntrepreneurPulse.com.au  Fact Sheet for Healthcare Providers: IncredibleEmployment.be  This test is not yet approved or cleared by the [place] FDA and has been authorized for detection and/or diagnosis of SARS-CoV-2 by FDA under an Emergency Use Authorization (EUA). This EUA will remain in effect (meaning this test can be used) for the duration of the COVID-19 declaration under Section 564(b)(1) of the Act, 21 U.S.C. section 360bbb-3(b)(1), unless the authorization is terminated or revoked.  Performed at [facility], [address], [place], [place] [address]   Peritoneal fluid culture w Gram Stain     Status: None   Collection Time: 02/01/22  4:59 PM   Specimen: Peritoneal Washings; Peritoneal Fluid  Result Value Ref Range Status   Specimen Description   Final     PERITONEAL Performed at [facility], [address] [place], [place] [address]    Special Requests   Final    NONE Performed at [facility], [place], [place] [address]    Gram Stain NO ORGANISMS SEEN NO WBC SEEN   Final   Culture   Final    NO GROWTH 3 DAYS Performed at [facility], [place] [address] [place], [place] [address]    Report Status 02/05/2022 FINAL  Final  MRSA Next Gen by PCR, Nasal     Status: None   Collection Time: 02/01/22 10:53 PM   Specimen: Nasal Mucosa; Nasal Swab  Result Value Ref Range Status   MRSA by PCR Next Gen NOT DETECTED NOT DETECTED Final    Comment: (NOTE) The GeneXpert MRSA Assay (FDA approved for NASAL specimens only), is one component of a comprehensive MRSA colonization surveillance program. It is not intended to diagnose MRSA infection nor to guide or monitor treatment for MRSA infections. Test performance is not FDA approved in patients less than 63 years old. Performed at [facility], [address] [place], [place] [address]   C Difficile Quick Screen w PCR reflex     Status: None   Collection Time: 02/02/22  5:58 PM   Specimen: STOOL  Result Value Ref Range Status   C Diff antigen NEGATIVE NEGATIVE Final   C Diff toxin NEGATIVE NEGATIVE Final   C Diff interpretation No C. difficile detected.  Final    Comment: Performed at [facility], [address] [place], [place] [address]  Gastrointestinal Panel by PCR , Stool     Status: None   Collection Time: 02/02/22  5:58 PM   Specimen: Stool  Result Value Ref Range Status   Campylobacter species NOT DETECTED NOT DETECTED Final   Plesimonas shigelloides NOT DETECTED NOT DETECTED Final   Salmonella species NOT DETECTED NOT DETECTED Final   Yersinia enterocolitica NOT DETECTED NOT DETECTED Final   Vibrio species NOT DETECTED NOT DETECTED Final   Vibrio cholerae NOT DETECTED NOT DETECTED Final   Enteroaggregative E coli  (EAEC) NOT DETECTED NOT DETECTED Final   Enteropathogenic E coli (EPEC) NOT DETECTED NOT DETECTED Final   Enterotoxigenic E coli (ETEC) NOT DETECTED NOT DETECTED Final   Shiga like toxin producing E coli (STEC) NOT DETECTED NOT DETECTED Final   Shigella/Enteroinvasive E  coli (EIEC) NOT DETECTED NOT DETECTED Final   Cryptosporidium NOT DETECTED NOT DETECTED Final   Cyclospora cayetanensis NOT DETECTED NOT DETECTED Final   Entamoeba histolytica NOT DETECTED NOT DETECTED Final   Giardia lamblia NOT DETECTED NOT DETECTED Final   Adenovirus F40/41 NOT DETECTED NOT DETECTED Final   Astrovirus NOT DETECTED NOT DETECTED Final   Norovirus GI/GII NOT DETECTED NOT DETECTED Final   Rotavirus A NOT DETECTED NOT DETECTED Final   Sapovirus (I, II, IV, and V) NOT DETECTED NOT DETECTED Final    Comment: Performed at [facility], [address], [place], [place] [address]  Peritoneal fluid culture w Gram Stain     Status: None   Collection Time: 02/03/22  2:42 PM   Specimen: Peritoneal Washings; Peritoneal Fluid  Result Value Ref Range Status   Specimen Description   Final    PERITONEAL Performed at [facility], [address], [place], [place] [address]    Special Requests   Final    NONE Performed at [facility], [place], [place] [address]    Gram Stain NO WBC SEEN NO ORGANISMS SEEN   Final   Culture   Final    NO GROWTH 3 DAYS Performed at [facility], [place] [address] [place], [place] [address]    Report Status 02/07/2022 FINAL  Final    Coagulation Studies: No results for input(s): "LABPROT", "INR" in the last 72 hours.   Urinalysis: No results for input(s): "COLORURINE", "LABSPEC", "PHURINE", "GLUCOSEU", "HGBUR", "BILIRUBINUR", "KETONESUR", "PROTEINUR", "UROBILINOGEN", "NITRITE", "LEUKOCYTESUR" in the last 72 hours.  Invalid input(s): "APPERANCEUR"    Imaging: No results found.   Medications:    sodium chloride Stopped  (02/02/22 0400)   albumin human 12.5 g (02/07/22 0801)    sodium chloride   Intravenous Once   Chlorhexidine Gluconate Cloth  6 each Topical Daily   docusate sodium  100 mg Oral BID   epoetin (EPOGEN/PROCRIT) injection  8,000 Units Intravenous Q T,Th,Sa-HD   feeding supplement (PRO-STAT SUGAR FREE 64)  30 mL Oral TID WC   lidocaine  1 patch Transdermal Q24H   midodrine  10 mg Oral TID WC   multivitamin  1 tablet Oral QHS   pantoprazole  40 mg Oral Daily   rifaximin  200 mg Oral BID   acetaminophen **OR** acetaminophen, albumin human, diclofenac Sodium, ipratropium-albuterol, nitroGLYCERIN, ondansetron **OR** ondansetron (ZOFRAN) IV, phenol  Assessment/ Plan:  April [name] is a 81 y.o.  female with past medical history including liver cirrhosis, anemia, diverticulitis, hypertension, CHF, CAD, and end stage renal disease on hemodialysis. She presents to the emergency department with weakness and vomiting. She has been admitted for Lactic acidosis [E87.20] Abdominal distension [R14.0] Severe sepsis (HCC) [A41.9, R65.20] Hypotension, unspecified hypotension type [I95.9] Cirrhosis of liver with ascites, unspecified hepatic cirrhosis type ([place]) [K74.60, R18.8] Other hypervolemia [E87.79]  [facility] /TTS/right chest PermCath  End stage renal disease on hemodialysis with generalized edema. Will maintain outpatient schedule during this admission.  Received UF only treatment yesterday, UF 1.9L removed. Scheduled treatment today with UF 2L as tolerated. Edema improving. Next treatment scheduled for Thursday.   2. Anemia of chronic kidney disease  Lab Results  Component Value Date   HGB 6.1 (L) 02/07/2022  Reports recent history of dark stools.  Requiring blood transfusions this admission Hgb below target, primary team has ordered 1u blood transfusion.   3. 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 Will continue to monitor.  4. Hypotension due to suspected sepsis. Workup ongoing. Empirical antibiotics ordered by primary team. Levo weaned yesterday, blood pressure stable.  5. Hypokalemia Will continue to correct with dialysis   LOS: 6 Shaquandra Galano 10/10/202310:38 AM

## 2022-02-07 NOTE — Progress Notes (Signed)
Physical Therapy Treatment Patient Details Name: April [name] MRN: [id number] DOB: [birth year] Today's Date: 02/07/2022   History of Present Illness Pt is an 81 y/o F admitted on 02/01/22 after presenting to the ED with c/o weakness, N&V, & tremors. Pt is being treated for severe sepsis of unclear etiology. PMH: liver cirrhosis, ESRD on HD TTS, HTN    PT Comments    Pt received in supine position and agreeable to therapy.  Pt participated in bed-level exercises prior to attempting mobility outside of the bed.  Pt with improved strength in the LE's compared to prior admissions.  Pt able to come to sitting at EOB without any physical assistance from therapist.  Pt then participated in attempted mobility/marches but unable to complete at this time.  Pt was able to side-step towards head of bed to be repositioned for lunch.  Pt able to lift buttocks off bed and reposition in bed herself.  Pt then left with all needs met and son in room with pt.  Pt attempting to eat lunch at conclusion of session.  Current discharge plans to home with HHPT remain appropriate at this time.  Pt will continue to benefit from skilled therapy in order to address deficits listed below.     Recommendations for follow up therapy are one component of a multi-disciplinary discharge planning process, led by the attending physician.  Recommendations may be updated based on patient status, additional functional criteria and insurance authorization.  Follow Up Recommendations  Home health PT     Assistance Recommended at Discharge Frequent or constant Supervision/Assistance  Patient can return home with the following A little help with walking and/or transfers;A little help with bathing/dressing/bathroom;Assistance with cooking/housework;Assist for transportation;Help with stairs or ramp for entrance   Equipment Recommendations  None recommended by PT    Recommendations for Other Services       Precautions /  Restrictions Precautions Precautions: Fall Precaution Comments: low BP & MAP Restrictions Weight Bearing Restrictions: No     Mobility  Bed Mobility                    Transfers                        Ambulation/Gait                   Stairs             Wheelchair Mobility    Modified Rankin (Stroke Patients Only)       Balance Overall balance assessment: Needs assistance Sitting-balance support: Feet supported, Bilateral upper extremity supported Sitting balance-Leahy Scale: Fair     Standing balance support: During functional activity, Bilateral upper extremity supported, Reliant on assistive device for balance Standing balance-Leahy Scale: Fair                              Cognition Arousal/Alertness: Awake/alert Behavior During Therapy: WFL for tasks assessed/performed Overall Cognitive Status: Within Functional Limits for tasks assessed                                          Exercises Total Joint Exercises Ankle Circles/Pumps: AROM, Strengthening, Both, 10 reps, Supine Quad Sets: AROM, Strengthening, Both, 10 reps, Supine Gluteal Sets: AROM, Strengthening, Both, 10 reps, Supine Hip ABduction/ADduction:  AROM, Strengthening, Both, 10 reps, Supine Straight Leg Raises: AROM, Strengthening, Both, 10 reps, Supine    General Comments        Pertinent Vitals/Pain      Home Living                          Prior Function            PT Goals (current goals can now be found in the care plan section) Acute Rehab PT Goals Patient Stated Goal: get better, go home PT Goal Formulation: With patient Time For Goal Achievement: 02/19/22 Potential to Achieve Goals: Fair Progress towards PT goals: Progressing toward goals    Frequency    Min 2X/week      PT Plan Current plan remains appropriate    Co-evaluation              AM-PAC PT "6 Clicks" Mobility   Outcome  Measure  Help needed turning from your back to your side while in a flat bed without using bedrails?: A Little Help needed moving from lying on your back to sitting on the side of a flat bed without using bedrails?: A Little Help needed moving to and from a bed to a chair (including a wheelchair)?: A Little Help needed standing up from a chair using your arms (e.g., wheelchair or bedside chair)?: A Little Help needed to walk in hospital room?: A Little Help needed climbing 3-5 steps with a railing? : A Lot 6 Click Score: 17    End of Session   Activity Tolerance: Patient tolerated treatment well Patient left: in bed;with call bell/phone within reach;with bed alarm set;with family/visitor present Nurse Communication: Mobility status PT Visit Diagnosis: Unsteadiness on feet (R26.81);Muscle weakness (generalized) (M62.81)     Time: 7654-6503 PT Time Calculation (min) (ACUTE ONLY): 34 min  Charges:  $Therapeutic Exercise: 8-22 mins $Therapeutic Activity: 8-22 mins                     Gwenlyn Saran, PT, DPT 02/07/22, 4:17 PM

## 2022-02-07 NOTE — Progress Notes (Addendum)
[facility] NAME: April [name]    MR#:  [id number]  DATE OF BIRTH:  [birth year]  SUBJECTIVE:  Seen during HD Pt is asking me if she will go home Had 2liters UF yday and 2L planned today PO intake is improving.   VITALS:  Blood pressure (!) 113/50, pulse 81, temperature 97.8 F (36.6 C), temperature source Oral, resp. rate 19, height '4\' 9"'$  (1.448 m), weight 77.5 kg, last menstrual period 05/01/1972, SpO2 100 %.  PHYSICAL EXAMINATION:   GENERAL:  81 y.o.-year-old patient lying in the bed with no acute distress.  Chronically ill, weak Anasarca LUNGS: Normal breath sounds bilaterally, no wheezing CARDIOVASCULAR: S1, S2 normal. No murmurs,   ABDOMEN: Soft, nontender, distended. Bowel sounds present.  EXTREMITIES: chronic  edema b/l.    NEUROLOGIC: nonfocal  patient is alert and awake SKIN: per rn  LABORATORY PANEL:  CBC Recent Labs  Lab 02/07/22 0757  WBC 12.2*  HGB 6.1*  HCT 18.0*  PLT 105*     Chemistries  Recent Labs  Lab 02/04/22 0422 02/05/22 0602 02/07/22 0757  NA 131*   < > 130*  K 3.0*   < > 3.3*  CL 100   < > 96*  CO2 25   < > 26  GLUCOSE 126*   < > 112*  BUN 46*   < > 57*  CREATININE 4.00*   < > 4.55*  CALCIUM 7.8*   < > 7.9*  MG 2.0  --   --   AST 59*  --   --   ALT 36  --   --   ALKPHOS 85  --   --   BILITOT 1.4*  --   --    < > = values in this interval not displayed.    Assessment and Plan  Kellogg is an 81 year old female with history of liver cirrhosis, end-stage renal disease on hemodialysis Tuesday Thursday Saturday, hypertension, who presents emergency department for chief concerns of weakness and tremors.  CT abdomen pelvis without contrast: Was read as cirrhotic liver with moderate volume ascites.  Small left and trace right pleural effusion with associated compressive atelectasis.  Moderate large hiatal hernia containing majority of the stomach within the chest.  Aortic  atherosclerosis.  Anasarca.  Patient was hypotension was placed in the ICU currently on IV levophed. Lactic acid was elevated panel on empiric IV antibiotics. No fever.  Anasarca/ascites  Cirrhosis of liver (from radiologic studies)--pt to see Dr Riki Rusk as outpt--d/w dr Vicente Males H/o GI bleed with recent extensive w/u without obvious source of bleeding H/o hemorrhoids - IR consulted for paracentesis - Albumin as needed ordered --10/6--s/p paracentesis 2.2liter --fluid neg for SBP--d/c abxs --cont HD for UF  Severe sepsis (HCC)POA - Etiology unclear -  procalcitonin 3.8, -- blood cultures x2 negative --peritoneal fluid--neg for peritonitis - was on broad-spectrum antibiotic with vancomycin, cefepime, metronidazole--de-escalate to cefepime-- all cultures neg then will d/c abxs -  weaned off Levophed  --sepsis resolved  Anemia in ESRD (end-stage renal disease) ([place]) - came in hgb 7.4--6.6--1 unit BT --8.1--7.2--6.6--2nd unit today --avoid all blood thinners including heparin --pt has had extensive GI w/u done in March 2023--no major source of bleeding found   Acute on Chronic Diastolic CHF --2D echo on 12/08/2021 showed EF of 55-60% with grade 2 diastolic dysfunction.  Patient has 1+ leg edema, but no JVD, no pulmonary edema on chest x-ray.   --will reusme home torsemide  once bp stable -Fluid management per renal by dialysis   ESRD on dialysis [facility]) hypokalemia - Nephrology has been consulted, Dr. Candiss Norse - Strict I's and O's --UF with HD   H/o HTN (hypertension) Hypotension --cont midodrine  --off levophed --given liver and renal issues pt remains low with BP   OSA on CPAP - CPAP nightly ordered  Paroxysmal atrial fibrillation ([place]) - Patient is not on anticoagulation due to her severe anemia requiring BT's -  Currently NSR  Morbid obesity ([place]) - complicates overall care and prognosis.   H/o recent PE --not on any anticoagulation due to anemia requiring freq BT     Pt followed by Palliative care inpatient and out pt  Pt worked with PT/OT--HHPT recommended  DVT prophylaxis: SCD (gi bleed) Code Status: DNR Diet: Renal diet Family Communication: Updated son   Level of care: Telemetry Medical Status is: Inpatient Remains inpatient appropriate because: hypotension   EDD likely wednesday if ok with nephrology  TOTAL TIME TAKING CARE OF THIS PATIENT: 35 minutes.  >50% time spent on counselling and coordination of care  Note: This dictation was prepared with Dragon dictation along with smaller phrase technology. Any transcriptional errors that result from this process are unintentional.  Fritzi Mandes M.D    [facility]   CC: Primary care physician; Einar Pheasant, MD

## 2022-02-08 ENCOUNTER — Inpatient Hospital Stay: Payer: Medicare Other

## 2022-02-08 ENCOUNTER — Ambulatory Visit: Payer: Medicare Other

## 2022-02-08 DIAGNOSIS — A419 Sepsis, unspecified organism: Secondary | ICD-10-CM | POA: Diagnosis not present

## 2022-02-08 DIAGNOSIS — R6520 Severe sepsis without septic shock: Secondary | ICD-10-CM | POA: Diagnosis not present

## 2022-02-08 LAB — BASIC METABOLIC PANEL
Anion gap: 10 (ref 5–15)
BUN: 42 mg/dL — ABNORMAL HIGH (ref 8–23)
CO2: 26 mmol/L (ref 22–32)
Calcium: 8.1 mg/dL — ABNORMAL LOW (ref 8.9–10.3)
Chloride: 97 mmol/L — ABNORMAL LOW (ref 98–111)
Creatinine, Ser: 3.66 mg/dL — ABNORMAL HIGH (ref 0.44–1.00)
GFR, Estimated: 12 mL/min — ABNORMAL LOW (ref >60)
Glucose, Bld: 153 mg/dL — ABNORMAL HIGH (ref 70–99)
Potassium: 3.3 mmol/L — ABNORMAL LOW (ref 3.5–5.1)
Sodium: 133 mmol/L — ABNORMAL LOW (ref 135–145)

## 2022-02-08 LAB — CBC
HCT: 23.9 % — ABNORMAL LOW (ref 36.0–46.0)
Hemoglobin: 8 g/dL — ABNORMAL LOW (ref 12.0–15.0)
MCH: 30.5 pg (ref 26.0–34.0)
MCHC: 33.5 g/dL (ref 30.0–36.0)
MCV: 91.2 fL (ref 80.0–100.0)
Platelets: 107 10*3/uL — ABNORMAL LOW (ref 150–400)
RBC: 2.62 MIL/uL — ABNORMAL LOW (ref 3.87–5.11)
RDW: 19.7 % — ABNORMAL HIGH (ref 11.5–15.5)
WBC: 12.2 10*3/uL — ABNORMAL HIGH (ref 4.0–10.5)
nRBC: 0 % (ref 0.0–0.2)

## 2022-02-08 LAB — MAGNESIUM: Magnesium: 1.7 mg/dL (ref 1.7–2.4)

## 2022-02-08 MED ORDER — TORSEMIDE 20 MG PO TABS
80.0000 mg | ORAL_TABLET | Freq: Every day | ORAL | Status: DC
  Administered 2022-02-08 – 2022-02-12 (×5): 80 mg via ORAL
  Filled 2022-02-08 (×5): qty 4

## 2022-02-08 MED ORDER — AMIODARONE HCL 200 MG PO TABS
200.0000 mg | ORAL_TABLET | Freq: Every day | ORAL | Status: DC
  Administered 2022-02-08 – 2022-02-12 (×5): 200 mg via ORAL
  Filled 2022-02-08 (×5): qty 1

## 2022-02-08 NOTE — Progress Notes (Signed)
[facility]  ROUNDING NOTE   Subjective:   April [name] is a 81 year old female with past medical history including liver cirrhosis, anemia, diverticulitis, hypertension, CHF, CAD, and end stage renal disease on hemodialysis. She presents to the emergency department with weakness and vomiting. She has been admitted for Lactic acidosis [E87.20] Abdominal distension [R14.0] Severe sepsis (HCC) [A41.9, R65.20] Hypotension, unspecified hypotension type [I95.9] Cirrhosis of liver with ascites, unspecified hepatic cirrhosis type ([place]) [K74.60, R18.8] Other hypervolemia [E87.79]  This patient is known to our clinic and received outpatient dialysis treatments at [facility] on a TTS schedule,    Update:  Patient seen sitting up in bed, alert and oriented Edema greatly improved Tolerated dialysis well yesterday Remains on room air Appetite appropriate  Objective:  Vital signs in last 24 hours:  Temp:  [97.9 F (36.6 C)-99 F (37.2 C)] 99 F (37.2 C) (10/11 0800) Pulse Rate:  [66-83] 74 (10/11 0800) Resp:  [17-23] 23 (10/11 0800) BP: (101-129)/(38-56) 107/43 (10/11 0800) SpO2:  [93 %-100 %] 93 % (10/11 0800) Weight:  [75.5 kg] 75.5 kg (10/10 1146)  Weight change: -4.2 kg Filed Weights   02/06/22 1553 02/07/22 0759 02/07/22 1146  Weight: 81.7 kg 77.5 kg 75.5 kg    Intake/Output: I/O last 3 completed shifts: In: 796 [P.O.:630; I.V.:100; Blood:66] Out: 2001.9 [Other:2001.9]   Intake/Output this shift:  No intake/output data recorded.  Physical Exam: General: NAD  Head: Normocephalic, atraumatic. Moist oral mucosal membranes  Eyes: Anicteric  Lungs:  Clear to auscultation, normal effort, room air  Heart: Regular rate and rhythm  Abdomen:  Soft, nontender  Extremities:  1+  peripheral edema  Neurologic: Nonfocal, moving all four extremities  Skin: No lesions  Access: Rt chest permcath    Basic Metabolic Panel: Recent Labs  Lab 02/02/22 0420  02/03/22 0704 02/04/22 0422 02/05/22 0602 02/06/22 0415 02/07/22 0757 02/08/22 1049  NA 133* 133* 131* 130* 132* 130* 133*  K 3.6 3.0* 3.0* 3.3* 3.6 3.3* 3.3*  CL 100 99 100 96* 97* 96* 97*  CO2 18* '26 25 27 28 26 26  '$ GLUCOSE 145* 140* 126* 119* 123* 112* 153*  BUN 54* 35* 46* 31* 41* 57* 42*  CREATININE 4.60* 3.10* 4.00* 2.87* 3.75* 4.55* 3.66*  CALCIUM 8.3* 8.1* 7.8* 8.2* 8.1* 7.9* 8.1*  MG 1.8 1.6* 2.0  --   --   --  1.7  PHOS 4.2 2.9 3.0 2.1* 3.0 3.4  --      Liver Function Tests: Recent Labs  Lab 02/01/22 1152 02/02/22 0420 02/03/22 0704 02/04/22 0422 02/05/22 0602 02/06/22 0415 02/07/22 0757  AST 67* 78* 82* 59*  --   --   --   ALT 38 38 42 36  --   --   --   ALKPHOS 84 63 69 85  --   --   --   BILITOT 2.0* 2.1* 2.5* 1.4*  --   --   --   PROT 5.0* 4.8* 4.6* 4.3*  --   --   --   ALBUMIN 2.2* 2.2* 2.0* 1.7*  1.8* 2.1* 1.9* 2.3*    No results for input(s): "LIPASE", "AMYLASE" in the last 168 hours. Recent Labs  Lab 02/01/22 1723  AMMONIA 30     CBC: Recent Labs  Lab 02/01/22 1153 02/02/22 0420 02/03/22 0704 02/04/22 0422 02/05/22 0602 02/07/22 0757 02/07/22 1506 02/08/22 1049  WBC 6.8   < > 14.9* 11.6* 11.6* 12.2*  --  12.2*  NEUTROABS 5.8  --   --   --   --   --   --   --   HGB 7.7*   < > 8.1* 7.4* 7.2* 6.1* 8.3* 8.0*  HCT 24.0*   < > 24.0* 22.3* 21.9* 18.0* 25.2* 23.9*  MCV 98.0   < > 92.0 92.5 93.6 93.8  --  91.2  PLT 126*   < > 123* 120* 107* 105*  --  107*   < > = values in this interval not displayed.     Cardiac Enzymes: No results for input(s): "CKTOTAL", "CKMB", "CKMBINDEX", "TROPONINI" in the last 168 hours.  BNP: Invalid input(s): "POCBNP"  CBG: No results for input(s): "GLUCAP" in the last 168 hours.  Microbiology: Results for orders placed or performed during the hospital encounter of 02/01/22  Culture, blood (Routine x 2)     Status: None   Collection Time: 02/01/22 11:53 AM   Specimen: BLOOD  Result Value Ref Range  Status   Specimen Description BLOOD LEFT ANTECUBITAL  Final   Special Requests   Final    BOTTLES DRAWN AEROBIC AND ANAEROBIC Blood Culture results may not be optimal due to an excessive volume of blood received in culture bottles   Culture   Final    NO GROWTH 5 DAYS Performed at [facility], [address], [place], [place] [address]    Report Status 02/06/2022 FINAL  Final  Culture, blood (Routine x 2)     Status: None   Collection Time: 02/01/22  1:16 PM   Specimen: BLOOD  Result Value Ref Range Status   Specimen Description BLOOD BLOOD LEFT FOREARM  Final   Special Requests BLOOD LEFT ARM Blood Culture adequate volume  Final   Culture   Final    NO GROWTH 5 DAYS Performed at [facility], [address] [place], [place] [address]    Report Status 02/06/2022 FINAL  Final  Resp Panel by RT-PCR (Flu A&B, Covid) Anterior Nasal Swab     Status: None   Collection Time: 02/01/22  1:17 PM   Specimen: Anterior Nasal Swab  Result Value Ref Range Status   SARS Coronavirus 2 by RT PCR NEGATIVE NEGATIVE Final    Comment: (NOTE) SARS-CoV-2 target nucleic acids are NOT DETECTED.  The SARS-CoV-2 RNA is generally detectable in upper respiratory specimens during the acute phase of infection. The lowest concentration of SARS-CoV-2 viral copies this assay can detect is 138 copies/mL. A negative result does not preclude SARS-Cov-2 infection and should not be used as the sole basis for treatment or other patient management decisions. A negative result may occur with  improper specimen collection/handling, submission of specimen other than nasopharyngeal swab, presence of viral mutation(s) within the areas targeted by this assay, and inadequate number of viral copies(<138 copies/mL). A negative result must be combined with clinical observations, patient history, and epidemiological information. The expected result is Negative.  Fact Sheet for Patients:   EntrepreneurPulse.com.au  Fact Sheet for Healthcare Providers:  IncredibleEmployment.be  This test is no t yet approved or cleared by the [place] FDA and  has been authorized for detection and/or diagnosis of SARS-CoV-2 by FDA under an Emergency Use Authorization (EUA). This EUA will remain  in effect (meaning this test can be used) for the duration of the COVID-19 declaration under Section 564(b)(1) of the Act, 21 U.S.C.section 360bbb-3(b)(1), unless the authorization is terminated  or revoked sooner.       Influenza A by PCR  NEGATIVE NEGATIVE Final   Influenza B by PCR NEGATIVE NEGATIVE Final    Comment: (NOTE) The Xpert Xpress SARS-CoV-2/FLU/RSV plus assay is intended as an aid in the diagnosis of influenza from Nasopharyngeal swab specimens and should not be used as a sole basis for treatment. Nasal washings and aspirates are unacceptable for Xpert Xpress SARS-CoV-2/FLU/RSV testing.  Fact Sheet for Patients: EntrepreneurPulse.com.au  Fact Sheet for Healthcare Providers: IncredibleEmployment.be  This test is not yet approved or cleared by the [place] FDA and has been authorized for detection and/or diagnosis of SARS-CoV-2 by FDA under an Emergency Use Authorization (EUA). This EUA will remain in effect (meaning this test can be used) for the duration of the COVID-19 declaration under Section 564(b)(1) of the Act, 21 U.S.C. section 360bbb-3(b)(1), unless the authorization is terminated or revoked.  Performed at [facility], [address], [place], [place] [address]   Peritoneal fluid culture w Gram Stain     Status: None   Collection Time: 02/01/22  4:59 PM   Specimen: Peritoneal Washings; Peritoneal Fluid  Result Value Ref Range Status   Specimen Description   Final    PERITONEAL Performed at [facility], [address], [place], [place] [address]    Special  Requests   Final    NONE Performed at [facility], [place], [place] [address]    Gram Stain NO ORGANISMS SEEN NO WBC SEEN   Final   Culture   Final    NO GROWTH 3 DAYS Performed at [facility], [place] [address], [place], [place] [address]    Report Status 02/05/2022 FINAL  Final  MRSA Next Gen by PCR, Nasal     Status: None   Collection Time: 02/01/22 10:53 PM   Specimen: Nasal Mucosa; Nasal Swab  Result Value Ref Range Status   MRSA by PCR Next Gen NOT DETECTED NOT DETECTED Final    Comment: (NOTE) The GeneXpert MRSA Assay (FDA approved for NASAL specimens only), is one component of a comprehensive MRSA colonization surveillance program. It is not intended to diagnose MRSA infection nor to guide or monitor treatment for MRSA infections. Test performance is not FDA approved in patients less than 3 years old. Performed at [facility], [address] [place], [place] [address]   C Difficile Quick Screen w PCR reflex     Status: None   Collection Time: 02/02/22  5:58 PM   Specimen: STOOL  Result Value Ref Range Status   C Diff antigen NEGATIVE NEGATIVE Final   C Diff toxin NEGATIVE NEGATIVE Final   C Diff interpretation No C. difficile detected.  Final    Comment: Performed at [facility], [address] [place] [address] [place], [place] [address]  Gastrointestinal Panel by PCR , Stool     Status: None   Collection Time: 02/02/22  5:58 PM   Specimen: Stool  Result Value Ref Range Status   Campylobacter species NOT DETECTED NOT DETECTED Final   Plesimonas shigelloides NOT DETECTED NOT DETECTED Final   Salmonella species NOT DETECTED NOT DETECTED Final   Yersinia enterocolitica NOT DETECTED NOT DETECTED Final   Vibrio species NOT DETECTED NOT DETECTED Final   Vibrio cholerae NOT DETECTED NOT DETECTED Final   Enteroaggregative E coli (EAEC) NOT DETECTED NOT DETECTED Final   Enteropathogenic E coli (EPEC) NOT DETECTED NOT DETECTED Final    Enterotoxigenic E coli (ETEC) NOT DETECTED NOT DETECTED Final   Shiga like toxin producing E coli (  STEC) NOT DETECTED NOT DETECTED Final   Shigella/Enteroinvasive E coli (EIEC) NOT DETECTED NOT DETECTED Final   Cryptosporidium NOT DETECTED NOT DETECTED Final   Cyclospora cayetanensis NOT DETECTED NOT DETECTED Final   Entamoeba histolytica NOT DETECTED NOT DETECTED Final   Giardia lamblia NOT DETECTED NOT DETECTED Final   Adenovirus F40/41 NOT DETECTED NOT DETECTED Final   Astrovirus NOT DETECTED NOT DETECTED Final   Norovirus GI/GII NOT DETECTED NOT DETECTED Final   Rotavirus A NOT DETECTED NOT DETECTED Final   Sapovirus (I, II, IV, and V) NOT DETECTED NOT DETECTED Final    Comment: Performed at [facility], [address], [place], [place] [address]  Peritoneal fluid culture w Gram Stain     Status: None   Collection Time: 02/03/22  2:42 PM   Specimen: Peritoneal Washings; Peritoneal Fluid  Result Value Ref Range Status   Specimen Description   Final    PERITONEAL Performed at [facility], [address], [place], [place] [address]    Special Requests   Final    NONE Performed at [facility], [place], [place] [address]    Gram Stain NO WBC SEEN NO ORGANISMS SEEN   Final   Culture   Final    NO GROWTH 3 DAYS Performed at [facility], [address], [place], [place] [address]    Report Status 02/07/2022 FINAL  Final    Coagulation Studies: No results for input(s): "LABPROT", "INR" in the last 72 hours.   Urinalysis: No results for input(s): "COLORURINE", "LABSPEC", "PHURINE", "GLUCOSEU", "HGBUR", "BILIRUBINUR", "KETONESUR", "PROTEINUR", "UROBILINOGEN", "NITRITE", "LEUKOCYTESUR" in the last 72 hours.  Invalid input(s): "APPERANCEUR"    Imaging: No results found.   Medications:    sodium chloride Stopped (02/02/22 0400)   albumin human 12.5 g (02/07/22 0801)    sodium chloride   Intravenous Once    Chlorhexidine Gluconate Cloth  6 each Topical Daily   docusate sodium  100 mg Oral BID   epoetin (EPOGEN/PROCRIT) injection  8,000 Units Intravenous Q T,Th,Sa-HD   feeding supplement (PRO-STAT SUGAR FREE 64)  30 mL Oral TID WC   lidocaine  1 patch Transdermal Q24H   midodrine  10 mg Oral TID WC   multivitamin  1 tablet Oral QHS   pantoprazole  40 mg Oral Daily   rifaximin  200 mg Oral BID   torsemide  80 mg Oral Daily   acetaminophen **OR** acetaminophen, albumin human, diclofenac Sodium, ipratropium-albuterol, nitroGLYCERIN, ondansetron **OR** ondansetron (ZOFRAN) IV, phenol  Assessment/ Plan:  April [name] is a 81 y.o.  female with past medical history including liver cirrhosis, anemia, diverticulitis, hypertension, CHF, CAD, and end stage renal disease on hemodialysis. She presents to the emergency department with weakness and vomiting. She has been admitted for Lactic acidosis [E87.20] Abdominal distension [R14.0] Severe sepsis (HCC) [A41.9, R65.20] Hypotension, unspecified hypotension type [I95.9] Cirrhosis of liver with ascites, unspecified hepatic cirrhosis type ([place]) [K74.60, R18.8] Other hypervolemia [E87.79]  [facility]/TTS/right chest PermCath  End stage renal disease on hemodialysis with generalized edema. Will maintain outpatient schedule during this admission.  Dialysis received yesterday, UF 2L achieved. Next treatment scheduled for Thursday. Lower extremity greatly improved with dialysis and additional treatment on Monday. Patient cleared to discharge from renal stance and continue treatments at outpatient clinic. Will order Torsemide '80mg'$  daily at discharge.    2. Anemia of chronic kidney disease  Lab Results  Component Value Date   HGB 8.0 (  L) 02/08/2022  Reports recent history of dark stools.  Requiring blood transfusions this admission Hemoglobin improved after 1 unit blood transfusion yesterday.  Continue EPO with dialysis treatments.  3.  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  4. Hypotension due to suspected sepsis. Workup ongoing. Empirical antibiotics ordered by primary team.  Blood pressure remains stable for this patient.  5. Hypokalemia Potassium 3.3 today.  We will continue to correct with dialysis.   LOS: 7 Abby Tucholski 10/11/202311:32 AM

## 2022-02-08 NOTE — Social Work (Cosign Needed Addendum)
    Durable Medical Equipment  (From admission, onward)           Start     Ordered   02/08/22 1531  For home use only DME Bedside commode  Once       Question:  Patient needs a bedside commode to treat with the following condition  Answer:  Weakness   02/08/22 1530

## 2022-02-08 NOTE — Progress Notes (Signed)
Occupational Therapy Treatment Patient Details Name: April [name] MRN: [id number] DOB: 1940/06/25 Today's Date: 02/08/2022   History of present illness Pt is an 81 y/o F admitted on 02/01/22 after presenting to the ED with c/o weakness, N&V, & tremors. Pt is being treated for severe sepsis of unclear etiology. PMH: liver cirrhosis, ESRD on HD TTS, HTN   OT comments  April [name] was seen for OT treatment on this date. Upon arrival to room pt reclined in bed, reports generalized weakness but agreeable to tx. Pt requires bed rail use and SBA exit bed. Noted to have had BM in bed, MAX A pericare standing, tolerates ~2 min. CGA + RW bed>chair t/f with cues for RW use. Left in chair with breakfast tray setup. Pt making progress toward goals, will continue to follow POC. Discharge recommendation remains appropriate.     Recommendations for follow up therapy are one component of a multi-disciplinary discharge planning process, led by the attending physician.  Recommendations may be updated based on patient status, additional functional criteria and insurance authorization.    Follow Up Recommendations  Home health OT    Assistance Recommended at Discharge Frequent or constant Supervision/Assistance  Patient can return home with the following  A little help with walking and/or transfers;A lot of help with bathing/dressing/bathroom   Equipment Recommendations  BSC/3in1    Recommendations for Other Services      Precautions / Restrictions Precautions Precautions: Fall Restrictions Weight Bearing Restrictions: No       Mobility Bed Mobility Overal bed mobility: Needs Assistance Bed Mobility: Supine to Sit     Supine to sit: Min guard          Transfers Overall transfer level: Needs assistance Equipment used: Rolling walker (2 wheels) Transfers: Sit to/from Stand Sit to Stand: Min guard, From elevated surface                 Balance Overall balance assessment: Needs  assistance Sitting-balance support: Feet supported, Bilateral upper extremity supported Sitting balance-Leahy Scale: Fair     Standing balance support: During functional activity, Bilateral upper extremity supported, Reliant on assistive device for balance Standing balance-Leahy Scale: Fair                             ADL either performed or assessed with clinical judgement   ADL Overall ADL's : Needs assistance/impaired                                       General ADL Comments: CGA + RW for simulated BSC t/f, MAX A pericare standing. SETUP self-feeding seated in chair. MOD A for LB access seated      Cognition Arousal/Alertness: Awake/alert Behavior During Therapy: WFL for tasks assessed/performed Overall Cognitive Status: Within Functional Limits for tasks assessed                                                     Pertinent Vitals/ Pain       Pain Assessment Pain Assessment: No/denies pain   Frequency  Min 2X/week        Progress Toward Goals  OT Goals(current goals can now be found in the care plan section)  Progress towards OT goals: Progressing toward goals  Acute Rehab OT Goals Patient Stated Goal: to go home and get stronger OT Goal Formulation: With patient/family Time For Goal Achievement: 02/19/22 Potential to Achieve Goals: Good ADL Goals Pt Will Perform Grooming: with supervision;sitting Pt Will Transfer to Toilet: with supervision Pt Will Perform Toileting - Clothing Manipulation and hygiene: with supervision  Plan Discharge plan remains appropriate;Frequency remains appropriate    Co-evaluation                 AM-PAC OT "6 Clicks" Daily Activity     Outcome Measure   Help from another person eating meals?: None Help from another person taking care of personal grooming?: A Little Help from another person toileting, which includes using toliet, bedpan, or urinal?: A Lot Help from  another person bathing (including washing, rinsing, drying)?: A Lot Help from another person to put on and taking off regular upper body clothing?: A Little Help from another person to put on and taking off regular lower body clothing?: A Lot 6 Click Score: 16    End of Session Equipment Utilized During Treatment: Rolling walker (2 wheels)  OT Visit Diagnosis: Unsteadiness on feet (R26.81);Muscle weakness (generalized) (M62.81)   Activity Tolerance Patient tolerated treatment well   Patient Left in chair;with call bell/phone within reach   Nurse Communication          Time: 7681-1572 OT Time Calculation (min): 11 min  Charges: OT General Charges $OT Visit: 1 Visit OT Treatments $Self Care/Home Management : 8-22 mins  Dessie Coma, M.S. OTR/L  02/08/22, 10:07 AM  ascom [phone]

## 2022-02-08 NOTE — Progress Notes (Signed)
Clent Jacks called and updated on patient having paracentesis. Patient to be transferred when room 101 has cleaned.

## 2022-02-08 NOTE — Progress Notes (Signed)
Patient accepted back form paracentesis.  2.4L removed.  Vitals stable.  Right upper abdomen sire dressing clean, dry, and intact.

## 2022-02-08 NOTE — Progress Notes (Signed)
PROGRESS NOTE    April [name]  MEQ:[id number] DOB: [birth year] DOA: 02/01/2022 PCP: Einar Pheasant, MD  101A/101A-AA  LOS: 7 days   Brief hospital course:   Assessment & Plan: Loseke is an 81 year old female with history of liver cirrhosis, end-stage renal disease on hemodialysis Tuesday Thursday Saturday, hypertension, who presents emergency department for chief concerns of weakness and tremors.   CT abdomen pelvis without contrast: Was read as cirrhotic liver with moderate volume ascites.  Small left and trace right pleural effusion with associated compressive atelectasis.  Moderate large hiatal hernia containing majority of the stomach within the chest.  Aortic atherosclerosis.  Anasarca.   Patient was hypotension was placed in the ICU currently on IV levophed. Lactic acid was elevated panel on empiric IV antibiotics. No fever.   Anasarca/ascites  Cirrhosis of liver (from radiologic studies) --10/6 paracentesis 2.2 liter, fluid neg for SBP--d/c'ed abxs Plan: --repeat paracentesis today with IV albumin --cont HD for fluid removal --cont torsemide 80 mg daily --pt to see Dr Riki Rusk as outpt  Severe anemia  --with melena, presumed due to GI bleed, however, recent extensive w/u in March 2023 without obvious source of bleeding.  Recent anemia workup showed no def in vit B12, folate and iron. --s/p 2u pRBC. Plan: --may need outpatient blood transfusion PRN --pt to see Dr Riki Rusk as outpt, as discussed with Dr. Vicente Males   Severe sepsis, ruled out --no source of infection found.   Acute on Chronic Diastolic CHF --2D echo on 12/08/2021 showed EF of 55-60% with grade 2 diastolic dysfunction.  Patient has 1+ leg edema, but no JVD, no pulmonary edema on chest x-ray.   --resume torsemide -Fluid management per renal by dialysis   ESRD on dialysis ([place]) --iHD per nephro   Hypokalemia  H/o HTN, not currently active Hypotension --off levophed --cont midodrine    OSA on CPAP -  CPAP nightly ordered   Paroxysmal atrial fibrillation ([place]) - Patient is not on anticoagulation due to her severe anemia requiring BT's - Currently NSR --resume home amiodarone    Morbid obesity ([place]) - complicates overall care and prognosis.    H/o recent PE --not on any anticoagulation due to anemia requiring freq BT     DVT prophylaxis: SCD/Compression stockings Code Status: DNR  Family Communication:  Level of care: Med-Surg Dispo:   The patient is from: home Anticipated d/c is to: home Anticipated d/c date is: tomorrow   Subjective and Interval History:  RN reported multiple episodes of small amount of black soft formed stool.  Pt reported swelling in abdomen and BLE.   Objective: Vitals:   02/08/22 0800 02/08/22 1200 02/08/22 1609 02/08/22 1732  BP: (!) 107/43 100/61 (!) 104/58 (!) 121/45  Pulse: 74   70  Resp: (!) '23 19 19 16  '$ Temp: 99 F (37.2 C) 99 F (37.2 C) 98.9 F (37.2 C) 98.4 F (36.9 C)  TempSrc: Oral Oral Oral   SpO2: 93% 94% 97% 98%  Weight:      Height:        Intake/Output Summary (Last 24 hours) at 02/08/2022 1746 Last data filed at 02/08/2022 1300 Gross per 24 hour  Intake 390 ml  Output --  Net 390 ml   Filed Weights   02/06/22 1553 02/07/22 0759 02/07/22 1146  Weight: 81.7 kg 77.5 kg 75.5 kg    Examination:   Constitutional: NAD, AAOx3, sitting in recliner HEENT: conjunctivae and lids normal, EOMI CV: No cyanosis.   RESP: normal respiratory effort,  on RA Extremities: edema in BLE up to thighs SKIN: warm, dry Neuro: II - XII grossly intact.   Psych: Normal mood and affect.  Appropriate judgement and reason   Data Reviewed: I have personally reviewed labs and imaging studies  Time spent: 50 minutes  Enzo Bi, MD [facility] If 7PM-7AM, please contact night-coverage 02/08/2022, 5:46 PM

## 2022-02-08 NOTE — Progress Notes (Signed)
Dr. Billie Ruddy notified of 4 medium size black stools. Patient denies any abdominal pain or discomfort.  BP 107/43  MD asked if she would like to check hemoglobin.  No orders obtained at this time.

## 2022-02-08 NOTE — Progress Notes (Signed)
Patient taken to Ultrasound for possible paracentesis.  Patient taken via bed by Jeannine Kitten

## 2022-02-08 NOTE — Progress Notes (Signed)
Telephone report called to Clent Jacks, RN.

## 2022-02-08 NOTE — TOC CM/SW Note (Signed)
Patient is not able to walk the distance required to go the bathroom, or he/she is unable to safely negotiate stairs required to access the bathroom.  A 3in1 BSC will alleviate this problem  

## 2022-02-08 NOTE — Procedures (Signed)
PROCEDURE SUMMARY:  Successful ultrasound guided paracentesis from the right lower quadrant.  Yielded 2.4 L of clear yellow fluid.  No immediate complications.  The patient tolerated the procedure well.   Specimen not sent for labs.  EBL < 65m  The patient has required >/=2 paracenteses in a 30 day period and a screening evaluation by the [facility] has been arranged.  JSoyla Dryer ASouth [phone] 02/08/2022, 4:14 PM

## 2022-02-09 ENCOUNTER — Inpatient Hospital Stay: Payer: Medicare Other

## 2022-02-09 DIAGNOSIS — R6520 Severe sepsis without septic shock: Secondary | ICD-10-CM | POA: Diagnosis not present

## 2022-02-09 DIAGNOSIS — A419 Sepsis, unspecified organism: Secondary | ICD-10-CM | POA: Diagnosis not present

## 2022-02-09 LAB — BASIC METABOLIC PANEL
Anion gap: 9 (ref 5–15)
BUN: 53 mg/dL — ABNORMAL HIGH (ref 8–23)
CO2: 26 mmol/L (ref 22–32)
Calcium: 8.3 mg/dL — ABNORMAL LOW (ref 8.9–10.3)
Chloride: 99 mmol/L (ref 98–111)
Creatinine, Ser: 4.26 mg/dL — ABNORMAL HIGH (ref 0.44–1.00)
GFR, Estimated: 10 mL/min — ABNORMAL LOW (ref >60)
Glucose, Bld: 92 mg/dL (ref 70–99)
Potassium: 3.5 mmol/L (ref 3.5–5.1)
Sodium: 134 mmol/L — ABNORMAL LOW (ref 135–145)

## 2022-02-09 LAB — CBC
HCT: 19.9 % — ABNORMAL LOW (ref 36.0–46.0)
Hemoglobin: 6.6 g/dL — ABNORMAL LOW (ref 12.0–15.0)
MCH: 30.6 pg (ref 26.0–34.0)
MCHC: 33.2 g/dL (ref 30.0–36.0)
MCV: 92.1 fL (ref 80.0–100.0)
Platelets: 112 10*3/uL — ABNORMAL LOW (ref 150–400)
RBC: 2.16 MIL/uL — ABNORMAL LOW (ref 3.87–5.11)
RDW: 19.3 % — ABNORMAL HIGH (ref 11.5–15.5)
WBC: 11.6 10*3/uL — ABNORMAL HIGH (ref 4.0–10.5)
nRBC: 0 % (ref 0.0–0.2)

## 2022-02-09 LAB — MAGNESIUM: Magnesium: 1.9 mg/dL (ref 1.7–2.4)

## 2022-02-09 LAB — HEMOGLOBIN AND HEMATOCRIT, BLOOD
HCT: 25.1 % — ABNORMAL LOW (ref 36.0–46.0)
Hemoglobin: 8.4 g/dL — ABNORMAL LOW (ref 12.0–15.0)

## 2022-02-09 LAB — PREPARE RBC (CROSSMATCH)

## 2022-02-09 MED ORDER — EPOETIN ALFA 4000 UNIT/ML IJ SOLN
INTRAMUSCULAR | Status: AC
  Filled 2022-02-09: qty 2

## 2022-02-09 MED ORDER — HEPARIN SODIUM (PORCINE) 1000 UNIT/ML IJ SOLN
INTRAMUSCULAR | Status: AC
  Filled 2022-02-09: qty 10

## 2022-02-09 MED ORDER — SODIUM CHLORIDE 0.9% IV SOLUTION: Freq: Once | INTRAVENOUS | Status: AC

## 2022-02-09 MED ORDER — CHLORHEXIDINE GLUCONATE CLOTH 2 % EX PADS
6.0000 | MEDICATED_PAD | Freq: Every day | CUTANEOUS | Status: DC
  Administered 2022-02-09 – 2022-02-12 (×4): 6 via TOPICAL

## 2022-02-09 NOTE — Progress Notes (Signed)
PROGRESS NOTE    April [name]  EXH:[id number] DOB: [birth year] DOA: 02/01/2022 PCP: Einar Pheasant, MD  101A/101A-AA  LOS: 8 days   Brief hospital course:   Assessment & Plan: April [name] is an 81 year old female with history of liver cirrhosis, end-stage renal disease on hemodialysis Tuesday Thursday Saturday, hypertension, who presents emergency department for chief concerns of weakness and tremors.   CT abdomen pelvis without contrast: Was read as cirrhotic liver with moderate volume ascites.  Small left and trace right pleural effusion with associated compressive atelectasis.  Moderate large hiatal hernia containing majority of the stomach within the chest.  Aortic atherosclerosis.  Anasarca.   Patient was hypotension was placed in the ICU currently on IV levophed. Lactic acid was elevated panel on empiric IV antibiotics. No fever.   Anasarca/ascites  Cirrhosis of liver (from radiologic studies) --10/6 paracentesis 2.2 liter, fluid neg for SBP--d/c'ed abxs --10/11 paracentesis with 2.4L removed. Plan: --cont HD for fluid removal --cont torsemide 80 mg daily --pt to see Dr Riki Rusk as outpt  Severe anemia  --with melena, presumed due to GI bleed, however, recent extensive w/u in March 2023 without obvious source of bleeding.  Recent anemia workup showed no def in vit B12, folate and iron. --s/p 2u pRBC. Plan: --3rd unit of pRBC today for Hgb 6.6 --re-engage GI to consider repeat endoluminal studies --may need outpatient blood transfusion PRN  Severe sepsis, ruled out --no source of infection found.   Acute on Chronic Diastolic CHF --2D echo on 12/08/2021 showed EF of 55-60% with grade 2 diastolic dysfunction.  Patient has 1+ leg edema, but no JVD, no pulmonary edema on chest x-ray.   --cont torsemide as 80 mg daily, per nephro -Fluid management per renal by dialysis   ESRD on dialysis ([place]) --iHD per nephro   Hypokalemia  H/o HTN, not currently  active Hypotension --off levophed --cont midodrine 10 mg TID   OSA on CPAP - CPAP nightly ordered   Paroxysmal atrial fibrillation ([place]) - Patient is not on anticoagulation due to her severe anemia requiring BT's - Currently NSR --cont home amiodarone    Morbid obesity ([place]) - complicates overall care and prognosis.    H/o recent PE --not on any anticoagulation due to anemia requiring freq BT     DVT prophylaxis: SCD/Compression stockings Code Status: DNR  Family Communication: son updated on the phone today Level of care: Med-Surg Dispo:   The patient is from: home Anticipated d/c is to: home Anticipated d/c date is: undetermined   Subjective and Interval History:  Pt denied dyspnea.  Hgb dropped again to 6.6 this morning, received 1u pRBC with dialysis.   Objective: Vitals:   02/09/22 1339 02/09/22 1410 02/09/22 1525 02/09/22 1937  BP: (!) 135/53 (!) 110/50 (!) 116/50 (!) 111/52  Pulse: 92 97 90 80  Resp: '16 16 17 16  '$ Temp:  98.4 F (36.9 C) 98.3 F (36.8 C) 98.1 F (36.7 C)  TempSrc:      SpO2: 99% 100% 98% 97%  Weight: 71.3 kg     Height:        Intake/Output Summary (Last 24 hours) at 02/09/2022 2053 Last data filed at 02/09/2022 1815 Gross per 24 hour  Intake 451.05 ml  Output 2200 ml  Net -1748.95 ml   Filed Weights   02/07/22 0759 02/07/22 1146 02/09/22 1339  Weight: 77.5 kg 75.5 kg 71.3 kg    Examination:   Constitutional: NAD, AAOx3 HEENT: conjunctivae and lids normal, EOMI CV: No cyanosis.  RESP: normal respiratory effort, on RA Neuro: II - XII grossly intact.   Psych: Normal mood and affect.  Appropriate judgement and reason   Data Reviewed: I have personally reviewed labs and imaging studies  Time spent: 50 minutes  Enzo Bi, MD [facility] If 7PM-7AM, please contact night-coverage 02/09/2022, 8:53 PM

## 2022-02-09 NOTE — Progress Notes (Addendum)
[facility]  ROUNDING NOTE   Subjective:   April [name] is a 81 year old female with past medical history including liver cirrhosis, anemia, diverticulitis, hypertension, CHF, CAD, and end stage renal disease on hemodialysis. She presents to the emergency department with weakness and vomiting. She has been admitted for Lactic acidosis [E87.20] Abdominal distension [R14.0] Severe sepsis (HCC) [A41.9, R65.20] Hypotension, unspecified hypotension type [I95.9] Cirrhosis of liver with ascites, unspecified hepatic cirrhosis type ([place]) [K74.60, R18.8] Other hypervolemia [E87.79]  This patient is known to our clinic and received outpatient dialysis treatments at [facility] on a TTS schedule,    Update:  Patient seen and evaluated during dialysis   HEMODIALYSIS FLOWSHEET:  Blood Flow Rate (mL/min): 400 mL/min Arterial Pressure (mmHg): -160 mmHg Venous Pressure (mmHg): 170 mmHg TMP (mmHg): 0 mmHg Ultrafiltration Rate (mL/min): 282 mL/min Dialysate Flow Rate (mL/min): 300 ml/min Dialysis Fluid Bolus: Normal Saline Bolus Amount (mL): 100 mL  Receiving 1 unit blood transfusion.  Paracentesis yesterday 2.4L removed  Objective:  Vital signs in last 24 hours:  Temp:  [97.8 F (36.6 C)-99 F (37.2 C)] 97.8 F (36.6 C) (10/12 1115) Pulse Rate:  [70-102] 88 (10/12 1130) Resp:  [15-23] 19 (10/12 1130) BP: (83-121)/(39-65) 119/51 (10/12 1130) SpO2:  [94 %-100 %] 99 % (10/12 1130)  Weight change:  Filed Weights   02/06/22 1553 02/07/22 0759 02/07/22 1146  Weight: 81.7 kg 77.5 kg 75.5 kg    Intake/Output: I/O last 3 completed shifts: In: 390 [P.O.:390] Out: -    Intake/Output this shift:  Total I/O In: 340 [Blood:340] Out: -   Physical Exam: General: NAD  Head: Normocephalic, atraumatic. Moist oral mucosal membranes  Eyes: Anicteric  Lungs:  Clear to auscultation, normal effort, room air  Heart: Regular rate and rhythm  Abdomen:  Soft, nontender   Extremities:  1+  peripheral edema  Neurologic: Nonfocal, moving all four extremities  Skin: No lesions  Access: Rt chest permcath    Basic Metabolic Panel: Recent Labs  Lab 02/03/22 0704 02/04/22 0422 02/05/22 0602 02/06/22 0415 02/07/22 0757 02/08/22 1049 02/09/22 0417  NA 133* 131* 130* 132* 130* 133* 134*  K 3.0* 3.0* 3.3* 3.6 3.3* 3.3* 3.5  CL 99 100 96* 97* 96* 97* 99  CO2 '26 25 27 28 26 26 26  '$ GLUCOSE 140* 126* 119* 123* 112* 153* 92  BUN 35* 46* 31* 41* 57* 42* 53*  CREATININE 3.10* 4.00* 2.87* 3.75* 4.55* 3.66* 4.26*  CALCIUM 8.1* 7.8* 8.2* 8.1* 7.9* 8.1* 8.3*  MG 1.6* 2.0  --   --   --  1.7 1.9  PHOS 2.9 3.0 2.1* 3.0 3.4  --   --      Liver Function Tests: Recent Labs  Lab 02/03/22 0704 02/04/22 0422 02/05/22 0602 02/06/22 0415 02/07/22 0757  AST 82* 59*  --   --   --   ALT 42 36  --   --   --   ALKPHOS 69 85  --   --   --   BILITOT 2.5* 1.4*  --   --   --   PROT 4.6* 4.3*  --   --   --   ALBUMIN 2.0* 1.7*  1.8* 2.1* 1.9* 2.3*    No results for input(s): "LIPASE", "AMYLASE" in the last 168 hours. No results for input(s): "AMMONIA" in the last 168 hours.   CBC: Recent Labs  Lab 02/04/22 0422 02/05/22 0602 02/07/22 0757 02/07/22 1506 02/08/22 1049 02/09/22 0417  WBC 11.6* 11.6* 12.2*  --  12.2* 11.6*  HGB 7.4* 7.2* 6.1* 8.3* 8.0* 6.6*  HCT 22.3* 21.9* 18.0* 25.2* 23.9* 19.9*  MCV 92.5 93.6 93.8  --  91.2 92.1  PLT 120* 107* 105*  --  107* 112*     Cardiac Enzymes: No results for input(s): "CKTOTAL", "CKMB", "CKMBINDEX", "TROPONINI" in the last 168 hours.  BNP: Invalid input(s): "POCBNP"  CBG: No results for input(s): "GLUCAP" in the last 168 hours.  Microbiology: Results for orders placed or performed during the hospital encounter of 02/01/22  Culture, blood (Routine x 2)     Status: None   Collection Time: 02/01/22 11:53 AM   Specimen: BLOOD  Result Value Ref Range Status   Specimen Description BLOOD LEFT ANTECUBITAL  Final    Special Requests   Final    BOTTLES DRAWN AEROBIC AND ANAEROBIC Blood Culture results may not be optimal due to an excessive volume of blood received in culture bottles   Culture   Final    NO GROWTH 5 DAYS Performed at [facility], [address] [place], [place] [address]    Report Status 02/06/2022 FINAL  Final  Culture, blood (Routine x 2)     Status: None   Collection Time: 02/01/22  1:16 PM   Specimen: BLOOD  Result Value Ref Range Status   Specimen Description BLOOD BLOOD LEFT FOREARM  Final   Special Requests BLOOD LEFT ARM Blood Culture adequate volume  Final   Culture   Final    NO GROWTH 5 DAYS Performed at [facility], [address] [place], [name] [address]    Report Status 02/06/2022 FINAL  Final  Resp Panel by RT-PCR (Flu A&B, Covid) Anterior Nasal Swab     Status: None   Collection Time: 02/01/22  1:17 PM   Specimen: Anterior Nasal Swab  Result Value Ref Range Status   SARS Coronavirus 2 by RT PCR NEGATIVE NEGATIVE Final    Comment: (NOTE) SARS-CoV-2 target nucleic acids are NOT DETECTED.  The SARS-CoV-2 RNA is generally detectable in upper respiratory specimens during the acute phase of infection. The lowest concentration of SARS-CoV-2 viral copies this assay can detect is 138 copies/mL. A negative result does not preclude SARS-Cov-2 infection and should not be used as the sole basis for treatment or other patient management decisions. A negative result may occur with  improper specimen collection/handling, submission of specimen other than nasopharyngeal swab, presence of viral mutation(s) within the areas targeted by this assay, and inadequate number of viral copies(<138 copies/mL). A negative result must be combined with clinical observations, patient history, and epidemiological information. The expected result is Negative.  Fact Sheet for Patients:  EntrepreneurPulse.com.au  Fact Sheet for Healthcare  Providers:  IncredibleEmployment.be  This test is no t yet approved or cleared by the [place] FDA and  has been authorized for detection and/or diagnosis of SARS-CoV-2 by FDA under an Emergency Use Authorization (EUA). This EUA will remain  in effect (meaning this test can be used) for the duration of the COVID-19 declaration under Section 564(b)(1) of the Act, 21 U.S.C.section 360bbb-3(b)(1), unless the authorization is terminated  or revoked sooner.       Influenza A by PCR NEGATIVE NEGATIVE Final   Influenza B by PCR NEGATIVE NEGATIVE Final    Comment: (NOTE) The Xpert Xpress SARS-CoV-2/FLU/RSV plus assay is intended as an aid in the diagnosis of influenza from Nasopharyngeal swab specimens and should not be used as a sole  basis for treatment. Nasal washings and aspirates are unacceptable for Xpert Xpress SARS-CoV-2/FLU/RSV testing.  Fact Sheet for Patients: EntrepreneurPulse.com.au  Fact Sheet for Healthcare Providers: IncredibleEmployment.be  This test is not yet approved or cleared by the [place] FDA and has been authorized for detection and/or diagnosis of SARS-CoV-2 by FDA under an Emergency Use Authorization (EUA). This EUA will remain in effect (meaning this test can be used) for the duration of the COVID-19 declaration under Section 564(b)(1) of the Act, 21 U.S.C. section 360bbb-3(b)(1), unless the authorization is terminated or revoked.  Performed at [facility], [address], [place], [place] [address]   Peritoneal fluid culture w Gram Stain     Status: None   Collection Time: 02/01/22  4:59 PM   Specimen: Peritoneal Washings; Peritoneal Fluid  Result Value Ref Range Status   Specimen Description   Final    PERITONEAL Performed at [facility], [address] [place], [place] [address]    Special Requests   Final    NONE Performed at [facility], [place], [place] [address]    Gram Stain NO ORGANISMS SEEN NO WBC SEEN   Final   Culture   Final    NO GROWTH 3 DAYS Performed at [facility], [place] [address] [place], [place] [address]    Report Status 02/05/2022 FINAL  Final  MRSA Next Gen by PCR, Nasal     Status: None   Collection Time: 02/01/22 10:53 PM   Specimen: Nasal Mucosa; Nasal Swab  Result Value Ref Range Status   MRSA by PCR Next Gen NOT DETECTED NOT DETECTED Final    Comment: (NOTE) The GeneXpert MRSA Assay (FDA approved for NASAL specimens only), is one component of a comprehensive MRSA colonization surveillance program. It is not intended to diagnose MRSA infection nor to guide or monitor treatment for MRSA infections. Test performance is not FDA approved in patients less than 52 years old. Performed at [facility] [address] [place], [place] [address]   C Difficile Quick Screen w PCR reflex     Status: None   Collection Time: 02/02/22  5:58 PM   Specimen: STOOL  Result Value Ref Range Status   C Diff antigen NEGATIVE NEGATIVE Final   C Diff toxin NEGATIVE NEGATIVE Final   C Diff interpretation No C. difficile detected.  Final    Comment: Performed at [facility], [address] [place],  [address]  Gastrointestinal Panel by PCR , Stool     Status: None   Collection Time: 02/02/22  5:58 PM   Specimen: Stool  Result Value Ref Range Status   Campylobacter species NOT DETECTED NOT DETECTED Final   Plesimonas shigelloides NOT DETECTED NOT DETECTED Final   Salmonella species NOT DETECTED NOT DETECTED Final   Yersinia enterocolitica NOT DETECTED NOT DETECTED Final   Vibrio species NOT DETECTED NOT DETECTED Final   Vibrio cholerae NOT DETECTED NOT DETECTED Final   Enteroaggregative E coli (EAEC) NOT DETECTED NOT DETECTED Final   Enteropathogenic E coli (EPEC) NOT DETECTED NOT DETECTED Final   Enterotoxigenic E coli (ETEC) NOT DETECTED NOT DETECTED Final    Shiga like toxin producing E coli (STEC) NOT DETECTED NOT DETECTED Final   Shigella/Enteroinvasive E coli (EIEC) NOT DETECTED NOT DETECTED Final   Cryptosporidium NOT DETECTED NOT DETECTED Final   Cyclospora cayetanensis NOT DETECTED NOT DETECTED Final   Entamoeba histolytica NOT DETECTED NOT DETECTED Final  Giardia lamblia NOT DETECTED NOT DETECTED Final   Adenovirus F40/41 NOT DETECTED NOT DETECTED Final   Astrovirus NOT DETECTED NOT DETECTED Final   Norovirus GI/GII NOT DETECTED NOT DETECTED Final   Rotavirus A NOT DETECTED NOT DETECTED Final   Sapovirus (I, II, IV, and V) NOT DETECTED NOT DETECTED Final    Comment: Performed at [facility], [address], [place], [place] [address]  Peritoneal fluid culture w Gram Stain     Status: None   Collection Time: 02/03/22  2:42 PM   Specimen: Peritoneal Washings; Peritoneal Fluid  Result Value Ref Range Status   Specimen Description   Final    PERITONEAL Performed at [facility], [address] [place], [place] [address]    Special Requests   Final    NONE Performed at [facility], [place], [place] [address]    Gram Stain NO WBC SEEN NO ORGANISMS SEEN   Final   Culture   Final    NO GROWTH 3 DAYS Performed at [facility], [place] [address] [place], [place] [address]    Report Status 02/07/2022 FINAL  Final    Coagulation Studies: No results for input(s): "LABPROT", "INR" in the last 72 hours.   Urinalysis: No results for input(s): "COLORURINE", "LABSPEC", "PHURINE", "GLUCOSEU", "HGBUR", "BILIRUBINUR", "KETONESUR", "PROTEINUR", "UROBILINOGEN", "NITRITE", "LEUKOCYTESUR" in the last 72 hours.  Invalid input(s): "APPERANCEUR"    Imaging: US Paracentesis  Result Date: 02/08/2022 INDICATION: Patient with a history of cirrhosis and recurrent ascites. Interventional radiology asked to perform a therapeutic paracentesis. EXAM: ULTRASOUND GUIDED PARACENTESIS MEDICATIONS: 1%  lidocaine 15 mL COMPLICATIONS: None immediate. PROCEDURE: Informed written consent was obtained from the patient after a discussion of the risks, benefits and alternatives to treatment. A timeout was performed prior to the initiation of the procedure. Initial ultrasound scanning demonstrates a large amount of ascites within the right lower abdominal quadrant. The right lower abdomen was prepped and draped in the usual sterile fashion. 1% lidocaine was used for local anesthesia. Following this, a 6 Fr Safe-T-Centesis catheter was introduced. An ultrasound image was saved for documentation purposes. The paracentesis was performed. The catheter was removed and a dressing was applied. The patient tolerated the procedure well without immediate post procedural complication. FINDINGS: A total of approximately 2.4 L of clear yellow fluid was removed. IMPRESSION: Successful ultrasound-guided paracentesis yielding 2.4 liters of peritoneal fluid. Read by: Soyla Dryer, NP PLAN: The patient has required >/=2 paracenteses in a 30 day period and a formal evaluation by the [facility] has been arranged. Electronically Signed   By: Aletta Edouard M.D.   On: 02/08/2022 16:21     Medications:    sodium chloride Stopped (02/02/22 0400)   albumin human 12.5 g (02/08/22 2123)    sodium chloride   Intravenous Once   sodium chloride   Intravenous Once   amiodarone  200 mg Oral Daily   Chlorhexidine Gluconate Cloth  6 each Topical Daily   docusate sodium  100 mg Oral BID   epoetin (EPOGEN/PROCRIT) injection  8,000 Units Intravenous Q T,Th,Sa-HD   feeding supplement (PRO-STAT SUGAR FREE 64)  30 mL Oral TID WC   lidocaine  1 patch Transdermal Q24H   midodrine  10 mg Oral TID WC   multivitamin  1 tablet Oral QHS   pantoprazole  40 mg Oral Daily   rifaximin  200 mg Oral BID   torsemide  80 mg Oral Daily  acetaminophen **OR** acetaminophen, albumin human, diclofenac  Sodium, ipratropium-albuterol, nitroGLYCERIN, ondansetron **OR** ondansetron (ZOFRAN) IV, phenol  Assessment/ Plan:  April [name] is a 81 y.o.  female with past medical history including liver cirrhosis, anemia, diverticulitis, hypertension, CHF, CAD, and end stage renal disease on hemodialysis. She presents to the emergency department with weakness and vomiting. She has been admitted for Lactic acidosis [E87.20] Abdominal distension [R14.0] Severe sepsis (HCC) [A41.9, R65.20] Hypotension, unspecified hypotension type [I95.9] Cirrhosis of liver with ascites, unspecified hepatic cirrhosis type ([place]) [K74.60, R18.8] Other hypervolemia [E87.79]  [facility]/TTS/right chest PermCath  End stage renal disease on hemodialysis with generalized edema. Will maintain outpatient schedule during this admission.  Receiving dialysis today, UF goal 2L as tolerated. Next treatment scheduled for Saturday.    2. Anemia of chronic kidney disease  Lab Results  Component Value Date   HGB 6.6 (L) 02/09/2022  Reports recent history of dark stools.  Requiring blood transfusions this admission Hemoglobin decreased today, receiving blood transfusion with dialysis. Continue EPO.  3. 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   4. Hypotension due to suspected sepsis. Workup ongoing. Empirical antibiotics ordered by primary team.  Blood pressure 121/53 during dialysis.  5. Hypokalemia Potassium 3.5 today.  Correcting with dialysis.   LOS: 8 April [name] 10/12/202311:41 AM

## 2022-02-09 NOTE — Progress Notes (Signed)
PT Cancellation Note  Patient Details Name: ERMINIA MCNEW MRN: [id number] DOB: 10/25/1940   Cancelled Treatment:    Reason Eval/Treat Not Completed: Other (comment). Treatment attempted, however pt off unit for HD. Will re-attempt if able.   Witten Certain 02/09/2022, 10:17 AM Greggory Stallion, PT, DPT, GCS [phone]

## 2022-02-09 NOTE — Progress Notes (Signed)
Completed 3.5 hrs of HD UF = 2200 ml Given 1 unit of blood Report given to floor RN. Cvc Heplocked and capped    02/09/22 1334  Vitals  Temp 98.1 F (36.7 C)  Temp Source Oral  BP (!) 109/59  MAP (mmHg) 72  BP Location Right Arm  BP Method Automatic  Patient Position (if appropriate) Lying  Pulse Rate 98  Pulse Rate Source Monitor  ECG Heart Rate 97  Resp 12  Oxygen Therapy  SpO2 100 %  O2 Device Room Air  Patient Activity (if Appropriate) In bed  Pulse Oximetry Type Continuous

## 2022-02-09 NOTE — Progress Notes (Signed)
PT Cancellation Note  Patient Details Name: April [name] MRN: [id number] DOB: [birth year]   Cancelled Treatment:     Attempted this pm after returning from HD. Pt stated she had just vomited and was not feeling well enough to participate. Nursing notified. Will continue next available date/time per POC.   Josie Dixon 02/09/2022, 3:18 PM

## 2022-02-09 NOTE — Care Management Important Message (Signed)
Important Message  Patient Details  Name: April [name] MRN: [id number] Date of Birth: July 13, 1940   Medicare Important Message Given:  Yes     Juliann Pulse A Bowie Doiron 02/09/2022, 2:03 PM

## 2022-02-09 NOTE — Plan of Care (Signed)

## 2022-02-10 DIAGNOSIS — D500 Iron deficiency anemia secondary to blood loss (chronic): Secondary | ICD-10-CM

## 2022-02-10 DIAGNOSIS — K7460 Unspecified cirrhosis of liver: Secondary | ICD-10-CM

## 2022-02-10 DIAGNOSIS — R188 Other ascites: Secondary | ICD-10-CM

## 2022-02-10 DIAGNOSIS — R6520 Severe sepsis without septic shock: Secondary | ICD-10-CM | POA: Diagnosis not present

## 2022-02-10 DIAGNOSIS — K921 Melena: Secondary | ICD-10-CM

## 2022-02-10 DIAGNOSIS — A419 Sepsis, unspecified organism: Secondary | ICD-10-CM | POA: Diagnosis not present

## 2022-02-10 LAB — CBC
HCT: 23.7 % — ABNORMAL LOW (ref 36.0–46.0)
Hemoglobin: 8 g/dL — ABNORMAL LOW (ref 12.0–15.0)
MCH: 31.9 pg (ref 26.0–34.0)
MCHC: 33.8 g/dL (ref 30.0–36.0)
MCV: 94.4 fL (ref 80.0–100.0)
Platelets: 104 10*3/uL — ABNORMAL LOW (ref 150–400)
RBC: 2.51 MIL/uL — ABNORMAL LOW (ref 3.87–5.11)
RDW: 18.8 % — ABNORMAL HIGH (ref 11.5–15.5)
WBC: 13.5 10*3/uL — ABNORMAL HIGH (ref 4.0–10.5)
nRBC: 0.1 % (ref 0.0–0.2)

## 2022-02-10 LAB — BPAM RBC
Blood Product Expiration Date: 202311042359
Blood Product Expiration Date: 202311102359
ISSUE DATE / TIME: 202310101058
ISSUE DATE / TIME: 202310120939
Unit Type and Rh: 6200
Unit Type and Rh: 6200

## 2022-02-10 LAB — BASIC METABOLIC PANEL
Anion gap: 7 (ref 5–15)
BUN: 34 mg/dL — ABNORMAL HIGH (ref 8–23)
CO2: 28 mmol/L (ref 22–32)
Calcium: 8.2 mg/dL — ABNORMAL LOW (ref 8.9–10.3)
Chloride: 100 mmol/L (ref 98–111)
Creatinine, Ser: 3.25 mg/dL — ABNORMAL HIGH (ref 0.44–1.00)
GFR, Estimated: 14 mL/min — ABNORMAL LOW (ref >60)
Glucose, Bld: 112 mg/dL — ABNORMAL HIGH (ref 70–99)
Potassium: 3.4 mmol/L — ABNORMAL LOW (ref 3.5–5.1)
Sodium: 135 mmol/L (ref 135–145)

## 2022-02-10 LAB — TYPE AND SCREEN
ABO/RH(D): A POS
Antibody Screen: NEGATIVE
Unit division: 0
Unit division: 0

## 2022-02-10 LAB — MAGNESIUM: Magnesium: 1.7 mg/dL (ref 1.7–2.4)

## 2022-02-10 MED ORDER — SODIUM CHLORIDE 0.9 % IV SOLN
300.0000 mg | Freq: Once | INTRAVENOUS | Status: AC
  Administered 2022-02-10: 300 mg via INTRAVENOUS
  Filled 2022-02-10: qty 300

## 2022-02-10 MED ORDER — RIFAXIMIN 550 MG PO TABS
550.0000 mg | ORAL_TABLET | Freq: Two times a day (BID) | ORAL | Status: DC
  Administered 2022-02-10 – 2022-02-12 (×4): 550 mg via ORAL
  Filled 2022-02-10 (×4): qty 1

## 2022-02-10 MED ORDER — SODIUM CHLORIDE 0.9 % IV SOLN: INTRAVENOUS | Status: DC

## 2022-02-10 MED ORDER — PANTOPRAZOLE SODIUM 40 MG PO TBEC
40.0000 mg | DELAYED_RELEASE_TABLET | Freq: Two times a day (BID) | ORAL | Status: DC
  Administered 2022-02-10 – 2022-02-12 (×4): 40 mg via ORAL
  Filled 2022-02-10 (×4): qty 1

## 2022-02-10 NOTE — TOC Initial Note (Signed)
Transition of Care [facility]) - Initial/Assessment Note    Patient Details  Name: April [name] MRN: [id number] Date of Birth: June 02, 1940  Transition of [facility]) CM/SW Contact:    Laurena Slimmer, RN Phone Number: 02/10/2022, 11:16 AM  Clinical Narrative: Spoke with patient to discuss discharge plan                  Admitted for: Sepsis Admitted from: [place] PCP: Dr. Einar Pheasant Pharmacy: [facility]  Current home health/prior home health/DME: [facility], [place], [place], [place] Transportation: Son will transport her home at discharge  Patient is active with [place] for PT, OT and nursing per [facility].    Expected Discharge Plan: [place] Barriers to Discharge: Continued Medical Work up   Patient Goals and CMS Choice Patient states their goals for this hospitalization and ongoing recovery are:: To return home CMS Medicare.gov Compare Post Acute Care list provided to:: Patient Choice offered to / list presented to : Patient  Expected Discharge Plan and Services Expected Discharge Plan: [place] Choice: [place] arrangements for the past 2 months: [place]                             [place]: [place])        Prior Living Arrangements/Services Living arrangements for the past 2 months: [place] Lives with:: Self Patient language and need for interpreter reviewed:: Yes Do you feel safe going back to the place where you live?: Yes      Need for Family Participation in Patient Care: Yes (Comment) Care giver support system in place?: Yes (comment)   Criminal Activity/Legal Involvement Pertinent to Current Situation/Hospitalization: No - Comment as needed  Activities of Daily Living      Permission Sought/Granted                  Emotional Assessment Appearance:: Appears stated  age Attitude/Demeanor/Rapport: Gracious, Engaged Affect (typically observed): Accepting Orientation: : Oriented to Self, Oriented to Place, Oriented to  Time, Oriented to Situation Alcohol / Substance Use: Not Applicable Psych Involvement: No (comment)  Admission diagnosis:  Lactic acidosis [E87.20] Abdominal distension [R14.0] Severe sepsis (HCC) [A41.9, R65.20] Hypotension, unspecified hypotension type [I95.9] Cirrhosis of liver with ascites, unspecified hepatic cirrhosis type ([place]) [K74.60, R18.8] Other hypervolemia [E87.79] Patient Active Problem List   Diagnosis Date Noted   Pressure injury of skin 02/05/2022   Shock ([place])    Severe sepsis ([place]) 02/01/2022   Anasarca 02/01/2022   Prolonged QT interval 02/01/2022   ESRD on dialysis ([place]) 01/09/2022   HTN (hypertension) 01/09/2022   COPD exacerbation ([place]) 01/09/2022   Sepsis ([place]) 01/09/2022   Anemia in ESRD (end-stage renal disease) ([place]) 01/09/2022   Thrombocytopenia ([place]) 78/46/9629   Acute metabolic encephalopathy 52/84/1324   Acute hepatic encephalopathy ([place]) 12/09/2021   Cirrhosis ([place]) 12/09/2021   Edema and ascites  12/08/2021   Anemia of chronic kidney failure, stage 4 (severe) ([place]) 12/07/2021   Weakness 10/29/2021   Acute respiratory failure with hypoxia ([place]) 10/18/2021   History of recurrent GI bleed on apixaban 10/17/2021   History of adverse reaction to anticoagulant medication 10/17/2021   Electrolyte abnormality 10/17/2021   Chronic diastolic CHF (congestive heart failure) ([place]) 08/08/2021   Chronic obstructive pulmonary disease, unspecified COPD type ([place]) 08/01/2021  Iron deficiency anemia    Symptomatic anemia    GI bleeding 07/07/2021   Thrombophilia ([place]) 04/10/2021   Aortic atherosclerosis ([place]) 01/03/2021   Cellulitis of leg, left 10/26/2020   Breast tenderness 07/14/2020   Abnormal liver function tests 03/11/2020   Lymphedema 07/09/2018   Acute renal failure superimposed on stage 4 chronic  kidney disease ([place]) 06/03/2018   Lower extremity edema 01/19/2018   Arthropathy of lumbar facet joint 08/08/2017   Degeneration of lumbar intervertebral disc 08/08/2017   Osteoarthritis of knee 08/08/2017   Paroxysmal atrial fibrillation ([place]) 03/27/2016   CAD S/P PCI pRCA Promus DES 3.5 x 16 (4.1 mm), ostRPDA Promus DES 2.5 x 12 (2.7 mm) 03/25/2016   Coronary artery disease involving native coronary artery of native heart with unstable angina pectoris ([place]) 06/01/2015   Diabetes mellitus ([place]) 05/16/2015   Gastritis 04/01/2015   Chest pain 04/01/2015   Hiatal hernia 08/25/2014   DOE (dyspnea on exertion) 07/15/2014   Congestion of throat 07/06/2014   Health care maintenance 07/06/2014   Fibrocystic breast disease 07/06/2014   Morbid obesity ([place]) 04/19/2014   Stress 02/16/2013   Rib pain on right side 02/16/2013   Acute on Chronic dyspnea 10/13/2012   GERD (gastroesophageal reflux disease) 10/13/2012   Essential hypertension, benign 08/18/2012   Hyperlipidemia 08/18/2012   History of colonic polyps 08/18/2012   OSA on CPAP 08/18/2012   Osteoporosis 08/18/2012   PCP:  Einar Pheasant, MD Pharmacy:   [place], [place] [address] Phone: [phone] Fax: [place], [place] [address] Phone: [phone] Fax: [place], [place] [address] [place] [address] Phone: [phone] Fax: [phone]     Social Determinants of Health (Echo) Interventions    Readmission Risk Interventions    02/10/2022   11:11 AM 11/28/2021   11:17 AM 10/19/2021   12:27 PM  Readmission Risk Prevention Plan  Transportation Screening Complete Complete Complete  PCP or Specialist Appt within 3-5 Days  Complete Complete  HRI or [place]   Complete Complete  Social Work Consult for [place] Planning/Counseling  Complete Complete  Palliative Care Screening  Not Applicable Complete  Medication Review Press photographer) Complete Complete Complete  PCP or Specialist appointment within 3-5 days of discharge Complete    HRI or [place] Complete    SW Recovery Care/Counseling Consult Complete    Palliative Care Screening Not [place] Not Applicable

## 2022-02-10 NOTE — Anesthesia Preprocedure Evaluation (Addendum)
Anesthesia Evaluation  Patient identified by MRN, date of birth, ID band Patient awake    Reviewed: Allergy & Precautions, NPO status , Patient's Chart, lab work & pertinent test results, reviewed documented beta blocker date and time   Airway Mallampati: III  TM Distance: >3 FB     Dental no notable dental hx.    Pulmonary shortness of breath and with exertion, sleep apnea ,  H/o recent PE   breath sounds clear to auscultation       Cardiovascular Exercise Tolerance: Poor hypertension, Pt. on home beta blockers + CAD, + Past MI, + Cardiac Stents (2017, patent on cath in 2019), +CHF (grade 2 diastolic dysfunction) and + DOE  + dysrhythmias (LBBB, Afib on amiodarone. Eliquis has been held) Atrial Fibrillation  Rhythm:Regular Rate:Normal  ECHO 9/23: 1. Left ventricular ejection fraction, by estimation, is 55 to 60%. The  left ventricle has normal function. The left ventricle has no regional  wall motion abnormalities. Left ventricular diastolic parameters are  indeterminate.  2. Right ventricular systolic function is normal. The right ventricular  size is normal. There is moderately elevated pulmonary artery systolic  pressure.  3. Left atrial size was severely dilated.  4. Right atrial size was mildly dilated.  5. The mitral valve is normal in structure. Mild to moderate mitral valve  regurgitation.  6. Tricuspid valve regurgitation is moderate.  7. The aortic valve is tricuspid. Aortic valve regurgitation is not  visualized.  8. The inferior vena cava is normal in size with <50% respiratory  variability, suggesting right atrial pressure of 8 mmHg.  lymphedema   Neuro/Psych  Headaches,    GI/Hepatic hiatal hernia (2016 moderate size ), GERD  ,(+) Cirrhosis       , 10/11 paracentesis with 2.4L removed 2 or 3 weeks dark tarry stools with worsening fatigue and exertional dyspnea  EGD 3/12: mildly erythematous  gastric mucosa   Endo/Other  diabetes, Type 2Obesity  Renal/GU DialysisRenal disease     Musculoskeletal   Abdominal (+) + obese,   Peds  Hematology  (+) Blood dyscrasia, anemia ,   Anesthesia Other Findings Presented with sepsis now receiving abx. Required 3 units of PRBCs during this admission.  Reproductive/Obstetrics                            Anesthesia Physical  Anesthesia Plan  ASA: III  Anesthesia Plan: General   Post-op Pain Management:    Induction: Intravenous  PONV Risk Score and Plan: 3 and Propofol infusion and TIVA  Airway Management Planned: Natural Airway and Simple Face Mask  Additional Equipment:   Intra-op Plan:   Post-operative Plan:   Informed Consent:     Dental advisory given  Plan Discussed with: Anesthesiologist and CRNA  Anesthesia Plan Comments:         Anesthesia Quick Evaluation

## 2022-02-10 NOTE — Progress Notes (Signed)
[facility]  ROUNDING NOTE   Subjective:   April [name] is a 81 year old female with past medical history including liver cirrhosis, anemia, diverticulitis, hypertension, CHF, CAD, and end stage renal disease on hemodialysis. She presents to the emergency department with weakness and vomiting. She has been admitted for Lactic acidosis [E87.20] Abdominal distension [R14.0] Severe sepsis ([place]) [A41.9, R65.20] Hypotension, unspecified hypotension type [I95.9] Cirrhosis of liver with ascites, unspecified hepatic cirrhosis type ([place]) [K74.60, R18.8] Other hypervolemia [E87.79]  This patient is known to our clinic and received outpatient dialysis treatments at [facility] on a TTS schedule,    Update:  Patient seen resting quietly, no family at bedside Remains on room air Continues to complain of weakness and fatigue  Objective:  Vital signs in last 24 hours:  Temp:  [97.8 F (36.6 C)-99.1 F (37.3 C)] 99 F (37.2 C) (10/13 0725) Pulse Rate:  [71-98] 71 (10/13 0725) Resp:  [12-23] 16 (10/13 0725) BP: (91-135)/(42-65) 102/42 (10/13 0725) SpO2:  [94 %-100 %] 94 % (10/13 0725) Weight:  [71.3 kg] 71.3 kg (10/12 1339)  Weight change:  Filed Weights   02/07/22 0759 02/07/22 1146 02/09/22 1339  Weight: 77.5 kg 75.5 kg 71.3 kg    Intake/Output: I/O last 3 completed shifts: In: 688.1 [P.O.:237; I.V.:15.2; Blood:340; IV Piggyback:95.9] Out: 2200 [Other:2200]   Intake/Output this shift:  No intake/output data recorded.  Physical Exam: General: NAD  Head: Normocephalic, atraumatic. Moist oral mucosal membranes  Eyes: Anicteric  Lungs:  Clear to auscultation, normal effort, room air  Heart: Regular rate and rhythm  Abdomen:  Soft, nontender  Extremities:  1+  peripheral edema  Neurologic: Nonfocal, moving all four extremities  Skin: No lesions  Access: Rt chest permcath    Basic Metabolic Panel: Recent Labs  Lab 02/04/22 0422 02/05/22 0602 02/06/22 0415  02/07/22 0757 02/08/22 1049 02/09/22 0417 02/10/22 0709  NA 131* 130* 132* 130* 133* 134* 135  K 3.0* 3.3* 3.6 3.3* 3.3* 3.5 3.4*  CL 100 96* 97* 96* 97* 99 100  CO2 '25 27 28 26 26 26 28  '$ GLUCOSE 126* 119* 123* 112* 153* 92 112*  BUN 46* 31* 41* 57* 42* 53* 34*  CREATININE 4.00* 2.87* 3.75* 4.55* 3.66* 4.26* 3.25*  CALCIUM 7.8* 8.2* 8.1* 7.9* 8.1* 8.3* 8.2*  MG 2.0  --   --   --  1.7 1.9 1.7  PHOS 3.0 2.1* 3.0 3.4  --   --   --      Liver Function Tests: Recent Labs  Lab 02/04/22 0422 02/05/22 0602 02/06/22 0415 02/07/22 0757  AST 59*  --   --   --   ALT 36  --   --   --   ALKPHOS 85  --   --   --   BILITOT 1.4*  --   --   --   PROT 4.3*  --   --   --   ALBUMIN 1.7*  1.8* 2.1* 1.9* 2.3*    No results for input(s): "LIPASE", "AMYLASE" in the last 168 hours. No results for input(s): "AMMONIA" in the last 168 hours.   CBC: Recent Labs  Lab 02/05/22 0602 02/07/22 0757 02/07/22 1506 02/08/22 1049 02/09/22 0417 02/09/22 1908 02/10/22 0709  WBC 11.6* 12.2*  --  12.2* 11.6*  --  13.5*  HGB 7.2* 6.1* 8.3* 8.0* 6.6* 8.4* 8.0*  HCT 21.9* 18.0* 25.2* 23.9* 19.9* 25.1* 23.7*  MCV 93.6 93.8  --  91.2 92.1  --  94.4  PLT 107* 105*  --  107* 112*  --  104*     Cardiac Enzymes: No results for input(s): "CKTOTAL", "CKMB", "CKMBINDEX", "TROPONINI" in the last 168 hours.  BNP: Invalid input(s): "POCBNP"  CBG: No results for input(s): "GLUCAP" in the last 168 hours.  Microbiology: Results for orders placed or performed during the hospital encounter of 02/01/22  Culture, blood (Routine x 2)     Status: None   Collection Time: 02/01/22 11:53 AM   Specimen: BLOOD  Result Value Ref Range Status   Specimen Description BLOOD LEFT ANTECUBITAL  Final   Special Requests   Final    BOTTLES DRAWN AEROBIC AND ANAEROBIC Blood Culture results may not be optimal due to an excessive volume of blood received in culture bottles   Culture   Final    NO GROWTH 5 DAYS Performed at  [facility], [address] [place], [place] [address]    Report Status 02/06/2022 FINAL  Final  Culture, blood (Routine x 2)     Status: None   Collection Time: 02/01/22  1:16 PM   Specimen: BLOOD  Result Value Ref Range Status   Specimen Description BLOOD BLOOD LEFT FOREARM  Final   Special Requests BLOOD LEFT ARM Blood Culture adequate volume  Final   Culture   Final    NO GROWTH 5 DAYS Performed at [facility], [address] [place], [place] [address]    Report Status 02/06/2022 FINAL  Final  Resp Panel by RT-PCR (Flu A&B, Covid) Anterior Nasal Swab     Status: None   Collection Time: 02/01/22  1:17 PM   Specimen: Anterior Nasal Swab  Result Value Ref Range Status   SARS Coronavirus 2 by RT PCR NEGATIVE NEGATIVE Final    Comment: (NOTE) SARS-CoV-2 target nucleic acids are NOT DETECTED.  The SARS-CoV-2 RNA is generally detectable in upper respiratory specimens during the acute phase of infection. The lowest concentration of SARS-CoV-2 viral copies this assay can detect is 138 copies/mL. A negative result does not preclude SARS-Cov-2 infection and should not be used as the sole basis for treatment or other patient management decisions. A negative result may occur with  improper specimen collection/handling, submission of specimen other than nasopharyngeal swab, presence of viral mutation(s) within the areas targeted by this assay, and inadequate number of viral copies(<138 copies/mL). A negative result must be combined with clinical observations, patient history, and epidemiological information. The expected result is Negative.  Fact Sheet for Patients:  EntrepreneurPulse.com.au  Fact Sheet for Healthcare Providers:  IncredibleEmployment.be  This test is no t yet approved or cleared by the [place] FDA and  has been authorized for detection and/or diagnosis of SARS-CoV-2 by FDA under an Emergency Use  Authorization (EUA). This EUA will remain  in effect (meaning this test can be used) for the duration of the COVID-19 declaration under Section 564(b)(1) of the Act, 21 U.S.C.section 360bbb-3(b)(1), unless the authorization is terminated  or revoked sooner.       Influenza A by PCR NEGATIVE NEGATIVE Final   Influenza B by PCR NEGATIVE NEGATIVE Final    Comment: (NOTE) The Xpert Xpress SARS-CoV-2/FLU/RSV plus assay is intended as an aid in the diagnosis of influenza from Nasopharyngeal swab specimens and should not be used as a sole basis for treatment. Nasal washings and aspirates are unacceptable for Xpert Xpress SARS-CoV-2/FLU/RSV testing.  Fact Sheet for Patients: EntrepreneurPulse.com.au  Fact Sheet for Healthcare Providers: IncredibleEmployment.be  This test is  not yet approved or cleared by the [place] and has been authorized for detection and/or diagnosis of SARS-CoV-2 by FDA under an Emergency Use Authorization (EUA). This EUA will remain in effect (meaning this test can be used) for the duration of the COVID-19 declaration under Section 564(b)(1) of the Act, 21 U.S.C. section 360bbb-3(b)(1), unless the authorization is terminated or revoked.  Performed at [facility], [address] [place], [place] [address]   Peritoneal fluid culture w Gram Stain     Status: None   Collection Time: 02/01/22  4:59 PM   Specimen: Peritoneal Washings; Peritoneal Fluid  Result Value Ref Range Status   Specimen Description   Final    PERITONEAL Performed at [facility], [address], [place], [place] [address]    Special Requests   Final    NONE Performed at [facility], [place], [place] [address]    Gram Stain NO ORGANISMS SEEN NO WBC SEEN   Final   Culture   Final    NO GROWTH 3 DAYS Performed at [facility], [place] [address] [place], [place] [address]    Report Status  02/05/2022 FINAL  Final  MRSA Next Gen by PCR, Nasal     Status: None   Collection Time: 02/01/22 10:53 PM   Specimen: Nasal Mucosa; Nasal Swab  Result Value Ref Range Status   MRSA by PCR Next Gen NOT DETECTED NOT DETECTED Final    Comment: (NOTE) The GeneXpert MRSA Assay (FDA approved for NASAL specimens only), is one component of a comprehensive MRSA colonization surveillance program. It is not intended to diagnose MRSA infection nor to guide or monitor treatment for MRSA infections. Test performance is not FDA approved in patients less than 21 years old. Performed at [facility], [address] [place], [place] [address]   C Difficile Quick Screen w PCR reflex     Status: None   Collection Time: 02/02/22  5:58 PM   Specimen: STOOL  Result Value Ref Range Status   C Diff antigen NEGATIVE NEGATIVE Final   C Diff toxin NEGATIVE NEGATIVE Final   C Diff interpretation No C. difficile detected.  Final    Comment: Performed at [facility], [address] [place], [place] [address]  Gastrointestinal Panel by PCR , Stool     Status: None   Collection Time: 02/02/22  5:58 PM   Specimen: Stool  Result Value Ref Range Status   Campylobacter species NOT DETECTED NOT DETECTED Final   Plesimonas shigelloides NOT DETECTED NOT DETECTED Final   Salmonella species NOT DETECTED NOT DETECTED Final   Yersinia enterocolitica NOT DETECTED NOT DETECTED Final   Vibrio species NOT DETECTED NOT DETECTED Final   Vibrio cholerae NOT DETECTED NOT DETECTED Final   Enteroaggregative E coli (EAEC) NOT DETECTED NOT DETECTED Final   Enteropathogenic E coli (EPEC) NOT DETECTED NOT DETECTED Final   Enterotoxigenic E coli (ETEC) NOT DETECTED NOT DETECTED Final   Shiga like toxin producing E coli (STEC) NOT DETECTED NOT DETECTED Final   Shigella/Enteroinvasive E coli (EIEC) NOT DETECTED NOT DETECTED Final   Cryptosporidium NOT DETECTED NOT DETECTED Final   Cyclospora cayetanensis NOT  DETECTED NOT DETECTED Final   Entamoeba histolytica NOT DETECTED NOT DETECTED Final   Giardia lamblia NOT DETECTED NOT DETECTED Final   Adenovirus F40/41 NOT DETECTED NOT DETECTED Final   Astrovirus NOT DETECTED NOT DETECTED Final   Norovirus GI/GII NOT DETECTED NOT  DETECTED Final   Rotavirus A NOT DETECTED NOT DETECTED Final   Sapovirus (I, II, IV, and V) NOT DETECTED NOT DETECTED Final    Comment: Performed at [facility], [address] [place], [place] [address]  Peritoneal fluid culture w Gram Stain     Status: None   Collection Time: 02/03/22  2:42 PM   Specimen: Peritoneal Washings; Peritoneal Fluid  Result Value Ref Range Status   Specimen Description   Final    PERITONEAL Performed at [facility], [address] [place], [place] [address]    Special Requests   Final    NONE Performed at [facility], [place], [place] [address]    Gram Stain NO WBC SEEN NO ORGANISMS SEEN   Final   Culture   Final    NO GROWTH 3 DAYS Performed at [facility], [place] [address], [place], [place] [address]    Report Status 02/07/2022 FINAL  Final    Coagulation Studies: No results for input(s): "LABPROT", "INR" in the last 72 hours.   Urinalysis: No results for input(s): "COLORURINE", "LABSPEC", "PHURINE", "GLUCOSEU", "HGBUR", "BILIRUBINUR", "KETONESUR", "PROTEINUR", "UROBILINOGEN", "NITRITE", "LEUKOCYTESUR" in the last 72 hours.  Invalid input(s): "APPERANCEUR"    Imaging: DG Abd 1 View  Result Date: 02/09/2022 CLINICAL DATA:  Nausea vomiting EXAM: ABDOMEN - 1 VIEW COMPARISON:  CT abdomen pelvis 02/01/2022 FINDINGS: The bowel gas pattern is normal. No radio-opaque calculi or other significant radiographic abnormality are seen. IMPRESSION: Negative. Electronically Signed   By: Franchot Gallo M.D.   On: 02/09/2022 16:20   US Paracentesis  Result Date: 02/08/2022 INDICATION: Patient with a history of cirrhosis and recurrent  ascites. Interventional radiology asked to perform a therapeutic paracentesis. EXAM: ULTRASOUND GUIDED PARACENTESIS MEDICATIONS: 1% lidocaine 15 mL COMPLICATIONS: None immediate. PROCEDURE: Informed written consent was obtained from the patient after a discussion of the risks, benefits and alternatives to treatment. A timeout was performed prior to the initiation of the procedure. Initial ultrasound scanning demonstrates a large amount of ascites within the right lower abdominal quadrant. The right lower abdomen was prepped and draped in the usual sterile fashion. 1% lidocaine was used for local anesthesia. Following this, a 6 Fr Safe-T-Centesis catheter was introduced. An ultrasound image was saved for documentation purposes. The paracentesis was performed. The catheter was removed and a dressing was applied. The patient tolerated the procedure well without immediate post procedural complication. FINDINGS: A total of approximately 2.4 L of clear yellow fluid was removed. IMPRESSION: Successful ultrasound-guided paracentesis yielding 2.4 liters of peritoneal fluid. Read by: Soyla Dryer, NP PLAN: The patient has required >/=2 paracenteses in a 30 day period and a formal evaluation by the [facility] has been arranged. Electronically Signed   By: Aletta Edouard M.D.   On: 02/08/2022 16:21     Medications:    sodium chloride Stopped (02/02/22 0400)   albumin human Stopped (02/08/22 2213)    sodium chloride   Intravenous Once   amiodarone  200 mg Oral Daily   Chlorhexidine Gluconate Cloth  6 each Topical Daily   docusate sodium  100 mg Oral BID   epoetin (EPOGEN/PROCRIT) injection  8,000 Units Intravenous Q T,Th,Sa-HD   feeding supplement (PRO-STAT SUGAR FREE 64)  30 mL Oral TID WC   lidocaine  1 patch Transdermal Q24H   midodrine  10 mg Oral TID WC   multivitamin  1 tablet Oral QHS   pantoprazole  40  mg Oral Daily   rifaximin  200 mg Oral BID    torsemide  80 mg Oral Daily   acetaminophen **OR** acetaminophen, albumin human, diclofenac Sodium, ipratropium-albuterol, nitroGLYCERIN, ondansetron **OR** ondansetron (ZOFRAN) IV, phenol  Assessment/ Plan:  April [name] is a 81 y.o.  female with past medical history including liver cirrhosis, anemia, diverticulitis, hypertension, CHF, CAD, and end stage renal disease on hemodialysis. She presents to the emergency department with weakness and vomiting. She has been admitted for Lactic acidosis [E87.20] Abdominal distension [R14.0] Severe sepsis (HCC) [A41.9, R65.20] Hypotension, unspecified hypotension type [I95.9] Cirrhosis of liver with ascites, unspecified hepatic cirrhosis type ([place]) [K74.60, R18.8] Other hypervolemia [E87.79]  [facility]/TTS/right chest PermCath  End stage renal disease on hemodialysis with generalized edema. Will maintain outpatient schedule during this admission.  Patient received dialysis yesterday, UF 2.2 L achieved.  Next treatment scheduled for Saturday.  2. Anemia of chronic kidney disease  Lab Results  Component Value Date   HGB 8.0 (L) 02/10/2022  Reports recent history of dark stools.  Requiring blood transfusions this admission Patient received blood transfusion yesterday.  Primary team considering GI consult.  We will continue EPO with dialysis treatments also.  3. 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  We will continue to monitor.  4. Hypotension due to suspected sepsis. Workup ongoing. Empirical antibiotics ordered by primary team.  Blood pressure 102/42.  5. Hypokalemia Potassium remains stable   LOS: 9 Talen Poser 10/13/202311:09 AM

## 2022-02-10 NOTE — Progress Notes (Signed)
Physical Therapy Treatment Patient Details Name: April [name] MRN: [id number] DOB: [birth year] Today's Date: 02/10/2022   History of Present Illness Pt is an 81 y/o F admitted on 02/01/22 after presenting to the ED with c/o weakness, N&V, & tremors. Pt is being treated for severe sepsis of unclear etiology. PMH: liver cirrhosis, ESRD on HD TTS, HTN    PT Comments    Pt was sitting in recliner, eating breakfast upon arriving. She is A and O x 4 with supportive son at bedside. She endorses feeling weak and planning to return to [place] at [place] with 24/7 caregivers. Pt does present with limited activity tolerance. HR and sao2 stable o2 dropped to 91% at its lowest. HR elevated to 103 bpm at its peak. Short rest between gait trials and between exercises. Overall pt tolerated session well. Highly recommend continued skilled PT at DC to maximize her activity tolerance + decreasing caregiver burden.Per supportive son, pt is scheduled for HD outpatient for T,TH,Sat. Acute PT will continue to follow while pt is in acute hospital.    Recommendations for follow up therapy are one component of a multi-disciplinary discharge planning process, led by the attending physician.  Recommendations may be updated based on patient status, additional functional criteria and insurance authorization.  Follow Up Recommendations  Home health PT (Pt has 24/7 caregivers at [place] per pt/and son)     Assistance Recommended at Discharge Frequent or constant Supervision/Assistance  Patient can return home with the following A little help with walking and/or transfers;A little help with bathing/dressing/bathroom;Assistance with cooking/housework;Assist for transportation;Help with stairs or ramp for entrance   Equipment Recommendations  None recommended by PT       Precautions / Restrictions Precautions Precautions: Fall Precaution Comments: low BP & MAP Restrictions Weight Bearing Restrictions: No     Mobility  Bed  Mobility    General bed mobility comments: in recliner pre/post session    Transfers Overall transfer level: Needs assistance Equipment used: Rolling walker (2 wheels) Transfers: Sit to/from Stand Sit to Stand: Supervision    General transfer comment: pt performed STS transfers 4 x throughout session    Ambulation/Gait Ambulation/Gait assistance: Min guard, Supervision Gait Distance (Feet): 40 Feet Assistive device: Rolling walker (2 wheels) Gait Pattern/deviations: Decreased step length - right, Decreased step length - left, Decreased stride length Gait velocity: decreased     General Gait Details: pt ambulated 4 x 40 ft   Balance Overall balance assessment: Needs assistance Sitting-balance support: Feet supported, Bilateral upper extremity supported Sitting balance-Leahy Scale: Fair     Standing balance support: During functional activity, Bilateral upper extremity supported, Reliant on assistive device for balance Standing balance-Leahy Scale: Fair       Cognition Arousal/Alertness: Awake/alert Behavior During Therapy: WFL for tasks assessed/performed Overall Cognitive Status: Within Functional Limits for tasks assessed    General Comments: sweet lady, motivated to mobilize           General Comments General comments (skin integrity, edema, etc.): pt performed several exercises in chair and was encouraged to continue to perform throughout the day to promote return in strength      Pertinent Vitals/Pain Pain Assessment Pain Assessment: No/denies pain     PT Goals (current goals can now be found in the care plan section) Acute Rehab PT Goals Patient Stated Goal: return home ([place]) at DC Progress towards PT goals: Progressing toward goals    Frequency    Min 2X/week      PT Plan Current  plan remains appropriate    Co-evaluation     PT goals addressed during session: Mobility/safety with mobility;Proper use of DME;Strengthening/ROM;Balance         AM-PAC PT "6 Clicks" Mobility   Outcome Measure  Help needed turning from your back to your side while in a flat bed without using bedrails?: A Little Help needed moving from lying on your back to sitting on the side of a flat bed without using bedrails?: A Little Help needed moving to and from a bed to a chair (including a wheelchair)?: A Little Help needed standing up from a chair using your arms (e.g., wheelchair or bedside chair)?: A Little Help needed to walk in hospital room?: A Little Help needed climbing 3-5 steps with a railing? : A Little 6 Click Score: 18    End of Session   Activity Tolerance: Patient tolerated treatment well;Patient limited by fatigue Patient left: in chair;with call bell/phone within reach;with chair alarm set;with family/visitor present Nurse Communication: Mobility status PT Visit Diagnosis: Unsteadiness on feet (R26.81);Muscle weakness (generalized) (M62.81)     Time: 1937-9024 PT Time Calculation (min) (ACUTE ONLY): 25 min  Charges:  $Gait Training: 8-22 mins $Therapeutic Exercise: 8-22 mins                    Julaine Fusi PTA 02/10/22, 1:10 PM

## 2022-02-10 NOTE — Plan of Care (Signed)

## 2022-02-10 NOTE — H&P (View-Only) (Signed)
April Darby, MD [address]  [place]  [place], [place] [address]  Main: [phone]  Fax: [phone] Pager: [phone]   Consultation  Referring Provider:     No ref. provider found Primary Care Physician:  Einar Pheasant, MD Primary Gastroenterologist:  Dr. Alice Reichert     Reason for Consultation: Melena, acute on chronic anemia  Date of Admission:  02/01/2022 Date of Consultation:  02/10/2022         HPI:   April [name] is a 81 y.o. female coronary disease s/p PCI with DES, CHF, ESRD on hemodialysis, chronic iron deficiency anemia hemodialysis, decompensated cirrhosis of liver is generally admitted on 02/01/2022 secondary to generalized weakness, shock, treated with empiric antibiotics and vasopressors.  History of paroxysmal A-fib, no longer on anticoagulation since April 2023 due to recurrent anemia requiring multiple blood transfusions.  Patient's hemoglobin was 7.4 on admission, dropped to 6.6 on 10/5, received blood transfusion, again dropped to 6.1 on 10/10, further dropped to 6.6 on 10/12.  Patient received 3 units of PRBCs during this admission.  She responds appropriately to blood transfusion.  GI is consulted due to witnessed episodes of multiple episodes of small amounts of soft black stools on 10/11, resulting in drop in hemoglobin and recurrent anemia.  Patient does have iron deficiency.  Currently on Protonix 40 mg IV daily.  Patient is sitting in the chair, mild short of breath speaking in full sentences  NSAIDs: None  Antiplts/Anticoagulants/Anti thrombotics: None  GI Procedures:  Upper endoscopy 07/10/2021 - Normal duodenal bulb and second portion of the duodenum. - Erythematous mucosa in the antrum and prepyloric region of the stomach. Biopsied. - Medium-sized hiatal hernia. - Normal gastroesophageal junction and esophagus. DIAGNOSIS:  A.  STOMACH, RANDOM; COLD BIOPSY:  - REACTIVE GASTRITIS.  - NEGATIVE FOR H. PYLORI, INTESTINAL METAPLASIA,  DYSPLASIA, AND  MALIGNANCY.   Colonoscopy 07/11/2021 - Three 2 to 4 mm polyps in the transverse colon, removed with a cold biopsy forceps. Resected and retrieved. - One 5 mm polyp in the descending colon, removed with a cold biopsy forceps. Resected and retrieved. Clip (MR conditional) was placed. - Diverticulosis in the sigmoid colon. - Non-bleeding internal hemorrhoids. - The examined portion of the ileum was normal. - Three 2 to 4 mm polyps in the transverse colon, removed with a cold biopsy forceps. Resected and retrieved. - One 5 mm polyp in the descending colon, removed with a cold biopsy forceps. Resected and retrieved. Clip (MR conditional) was placed. - Diverticulosis in the sigmoid colon. - Non-bleeding internal hemorrhoids. - The examined portion of the ileum was normal.  Video capsule endoscopy 07/11/2021 Normal  Upper endoscopy 03/17/2015 - Normal examined duodenum. - Normal stomach. - Medium-sized hiatus hernia. - Erosive gastropathy. Biopsied. - Gastritis. Biopsied. - Normal esophagus.  DIAGNOSIS:  A. STOMACH, ANTRUM; COLD BIOPSY:  - REACTIVE GASTROPATHY IN A 1 MM SUPERFICIAL SAMPLE (ADDITIONAL DEEPER  SECTIONS REVIEWED).  - NEGATIVE FOR HELICOBACTER PYLORI IN HEMATOXYLIN AND EOSIN SECTIONS.   B. STOMACH, CARDIA; COLD BIOPSY:  - MODERATE CHRONIC GASTRITIS WITH MINIMAL ACTIVITY.  - NEGATIVE FOR INTESTINAL METAPLASIA, DYSPLASIA, AND MALIGNANCY.   Past Medical History:  Diagnosis Date   Anemia    CAD S/P PCI pRCA Promus DES 3.5 x 16 (4.1 mm), ostRPDA Promus DES 2.5 x 12 (2.7 mm) 03/25/2016   CAD S/P percutaneous coronary angioplasty    a. 07/2015 MV: No ischemia, EF 66%;  b. 03/2016 Inflat STEMI/PCI: LM nl, LAD 25d, RI  nl, LCX nl, OM1/2 nl, RCA 80p (3.5x16 Promus Premier DES), 50p/m, 30d, RPDA 90 (2.5x12 Promu Premier DES); c. 01/2018 Cath ([place], [place]): Patent RCA stents->Med Rx.   CHF (congestive heart failure) ([place])    CKD (chronic kidney  disease), stage III ([place])    Diabetes mellitus without complication ([place])    Diastolic dysfunction    a. 10/2013 Echo: EF 55-65%, Gr1 DD; b. 01/2018 Echo ([place], [place]): EF 55-60%, Gr2 DD, RVSP 23mHg.   Diverticulitis    Dyspnea    Endometriosis    Family history of adverse reaction to anesthesia    Mother - had to stay in ICU because of breathing difficulties every time she had anesthesia   GERD (gastroesophageal reflux disease)    GI bleed    a. 06/2021 - melena-->2 u PRBCs.   Hiatal hernia    History of colon polyps 08/1994   History of Migraines    Hypercholesterolemia    Hypertension    LBBB (left bundle branch block)    Osteoporosis    PAF (paroxysmal atrial fibrillation) ([place]    a. 03/2016 Dx @ time of MI; b. CHA2DS2VASc = 5-->Eliquis.   Rheumatic fever    Sleep apnea    CPAP   Spasmodic dysphonia    ST elevation myocardial infarction (STEMI) of inferolateral wall, initial episode of care ([place] 03/25/2016   ST elevation myocardial infarction (STEMI) of inferolateral wall, initial episode of care ([facility] 03/25/2016   Urine incontinence     Past Surgical History:  Procedure Laterality Date   ABDOMINAL HYSTERECTOMY     ovaries not removed   APPENDECTOMY     was removed during hysterectomy   Breast biopsies     x2   BREAST EXCISIONAL BIOPSY Bilateral "years ago"   neg   BREAST SURGERY Bilateral    BROW LIFT Bilateral 08/15/2019   Procedure: BLEPHAROPLASTY UPPER EYELID; W/EXCESS SKIN BLEPHAROPTOSIS REPAIR; RESECT EX;  Surgeon: FKarle Starch MD;  [facility]  Service: Ophthalmology;  Laterality: Bilateral;  sleep apnea   CARDIAC CATHETERIZATION  2011   moderate 40% RCA disease   CARDIAC CATHETERIZATION  01/2010   Dr. Gollan'@[facility]'$ : Only noted 40% RCA   CARDIAC CATHETERIZATION N/A 03/25/2016   Procedure: Left Heart Cath and Coronary Angiography;  Surgeon: D12/11/2015 MD;  [facility];  Service: Cardiovascular;   Laterality: N/A;   CARDIAC CATHETERIZATION N/A 03/25/2016   Procedure: Coronary Stent Intervention;  Surgeon: D12/11/2015 MD;  [facility];  Service: Cardiovascular;  Laterality: N/A;   COLONOSCOPY  2013   COLONOSCOPY WITH PROPOFOL N/A 07/11/2021   Procedure: COLONOSCOPY WITH PROPOFOL;  Surgeon: W3/26/2023 MD;  [facility];  Service: Endoscopy;  Laterality: N/A;   DIALYSIS/PERMA CATHETER INSERTION N/A 12/15/2021   Procedure: DIALYSIS/PERMA CATHETER INSERTION;  Surgeon: D8/30/2023 MD;  [facility];  Service: Cardiovascular;  Laterality: N/A;   ESOPHAGOGASTRODUODENOSCOPY (EGD) WITH PROPOFOL N/A 03/17/2015   Procedure: ESOPHAGOGASTRODUODENOSCOPY (EGD) WITH PROPOFOL;  Surgeon: S11/29/2016 MD;  Location: [facility] ENDOSCOPY;  Service: Gastroenterology;  Laterality: N/A;   ESOPHAGOGASTRODUODENOSCOPY (EGD) WITH PROPOFOL N/A 07/10/2021   Procedure: ESOPHAGOGASTRODUODENOSCOPY (EGD) WITH PROPOFOL;  Surgeon: V16/03/2022 MD;  [facility];  Service: Gastroenterology;  Laterality: N/A;   GIVENS CAPSULE STUDY N/A 07/11/2021   Procedure: GIVENS CAPSULE STUDY;  Surgeon: W3/26/2023 MD;  [facility];  Service: Endoscopy;  Laterality: N/A;  NM MYOVIEW ([place] HX)  07/2015   No evidence ischemia or infarction. EF 66%. Low risk   TEMPORARY DIALYSIS CATHETER N/A 12/08/2021   Procedure: TEMPORARY DIALYSIS CATHETER;  Surgeon: Algernon Huxley, MD;  Location: [facility] LAB;  Service: Cardiovascular;  Laterality: N/A;   TONSILLECTOMY     TRANSTHORACIC ECHOCARDIOGRAM  10/2013   Normal LV size and function. EF 55-65%. GR 1 DD. Otherwise normal.     Current Facility-Administered Medications:    0.9 %  sodium chloride infusion (Manually program via Guardrails IV Fluids), , Intravenous, Once, Fritzi Mandes, MD   0.9 %  sodium chloride infusion, 250 mL, Intravenous, Continuous, Foust, Katy L, NP, Paused at 02/02/22 0400    acetaminophen (TYLENOL) tablet 650 mg, 650 mg, Oral, Q6H PRN, 650 mg at 02/10/22 0430 **OR** acetaminophen (TYLENOL) suppository 650 mg, 650 mg, Rectal, Q6H PRN, Cox, Amy N, DO   albumin human 25 % solution 12.5 g, 12.5 g, Intravenous, PRN, Cox, Amy N, DO, Stopped at 02/08/22 2213   amiodarone (PACERONE) tablet 200 mg, 200 mg, Oral, Daily, Enzo Bi, MD, 200 mg at 02/10/22 9242   Chlorhexidine Gluconate Cloth 2 % PADS 6 each, 6 each, Topical, Daily, Enzo Bi, MD, 6 each at 02/10/22 6834   diclofenac Sodium (VOLTAREN) 1 % topical gel 2 g, 2 g, Topical, TID PRN, Cox, Amy N, DO   docusate sodium (COLACE) capsule 100 mg, 100 mg, Oral, BID, Cox, Amy N, DO, 100 mg at 02/10/22 1962   epoetin alfa (EPOGEN) injection 8,000 Units, 8,000 Units, Intravenous, Q T,Th,Sa-HD, Murlean Iba, MD, 8,000 Units at 02/09/22 1136   feeding supplement (PRO-STAT SUGAR FREE 64) liquid 30 mL, 30 mL, Oral, TID WC, Cox, Amy N, DO, 30 mL at 02/09/22 0737   ipratropium-albuterol (DUONEB) 0.5-2.5 (3) MG/3ML nebulizer solution 3 mL, 3 mL, Nebulization, Q6H PRN, Cox, Amy N, DO   iron sucrose (VENOFER) 300 mg in sodium chloride 0.9 % 250 mL IVPB, 300 mg, Intravenous, Once, Jesua Tamblyn, Tally Due, MD   lidocaine (LIDODERM) 5 % 1 patch, 1 patch, Transdermal, Q24H, Cox, Amy N, DO   midodrine (PROAMATINE) tablet 10 mg, 10 mg, Oral, TID WC, Rust-[place]ster, Britton L, NP, 10 mg at 02/10/22 1138   multivitamin (RENA-VIT) tablet 1 tablet, 1 tablet, Oral, QHS, Fritzi Mandes, MD, 1 tablet at 02/09/22 2111   nitroGLYCERIN (NITROSTAT) SL tablet 0.4 mg, 0.4 mg, Sublingual, Q5 min PRN, Cox, Amy N, DO   ondansetron (ZOFRAN) tablet 4 mg, 4 mg, Oral, Q6H PRN **OR** ondansetron (ZOFRAN) injection 4 mg, 4 mg, Intravenous, Q6H PRN, Cox, Amy N, DO, 4 mg at 02/09/22 1459   pantoprazole (PROTONIX) EC tablet 40 mg, 40 mg, Oral, BID AC, Abisai Deer, Tally Due, MD   phenol (CHLORASEPTIC) mouth spray 1 spray, 1 spray, Mouth/Throat, PRN, Fritzi Mandes, MD, 1 spray at  02/04/22 1237   rifaximin (XIFAXAN) tablet 200 mg, 200 mg, Oral, BID, Cox, Amy N, DO, 200 mg at 02/10/22 2297   torsemide (DEMADEX) tablet 80 mg, 80 mg, Oral, Daily, Breeze, Shantelle, NP, 80 mg at 02/10/22 0813   Family History  Problem Relation Age of Onset   Arthritis Mother    Stroke Mother    Hypertension Mother    Osteoporosis Mother    Heart disease Father        MI   Heart attack Father    Stroke Brother    Heart attack Sister    Breast cancer Other  first cousin x 2   Stroke Daughter    Heart attack Daughter    Colon cancer Neg Hx      Social History   Tobacco Use   Smoking status: Never   Smokeless tobacco: Never  Vaping Use   Vaping Use: Never used  Substance Use Topics   Alcohol use: No    Alcohol/week: 0.0 standard drinks of alcohol   Drug use: No    Allergies as of 02/01/2022 - Review Complete 02/01/2022  Allergen Reaction Noted   Penicillins Other (See Comments) 10/11/2012   Sulfa antibiotics Rash 07/08/2012    Review of Systems:    All systems reviewed and negative except where noted in HPI.   Physical Exam:  Vital signs in last 24 hours: Temp:  [98.1 F (36.7 C)-99.1 F (37.3 C)] 99 F (37.2 C) (10/13 0725) Pulse Rate:  [71-90] 71 (10/13 0725) Resp:  [16-18] 16 (10/13 0725) BP: (102-116)/(42-52) 102/42 (10/13 0725) SpO2:  [94 %-98 %] 94 % (10/13 0725) Last BM Date : 02/09/22 General:   Pleasant, cooperative in NAD Head:  Normocephalic and atraumatic. Eyes:   No icterus.   Conjunctiva pink. PERRLA. Ears:  Normal auditory acuity. Neck:  Supple; no masses or thyroidomegaly Lungs: Respirations even and unlabored. Lungs clear to auscultation bilaterally.   No wheezes, crackles, or rhonchi.  Heart:  Regular rate and rhythm;  Without murmur, clicks, rubs or gallops Abdomen:  Soft, distended, nontender. Normal bowel sounds. No appreciable masses or hepatomegaly.  No rebound or guarding.  Rectal:  Not performed. Msk:  Symmetrical without  gross deformities.  Strength generalized weakness Extremities: 2+ edema, no cyanosis or clubbing. Neurologic:  Alert and oriented x3;  grossly normal neurologically. Skin:  Intact without significant lesions or rashes. Psych:  Alert and cooperative. Normal affect.  LAB RESULTS:    Latest Ref Rng & Units 02/10/2022    7:09 AM 02/09/2022    7:08 PM 02/09/2022    4:17 AM  CBC  WBC 4.0 - 10.5 K/uL 13.5   11.6   Hemoglobin 12.0 - 15.0 g/dL 8.0  8.4  6.6   Hematocrit 36.0 - 46.0 % 23.7  25.1  19.9   Platelets 150 - 400 K/uL 104   112     BMET    Latest Ref Rng & Units 02/10/2022    7:09 AM 02/09/2022    4:17 AM 02/08/2022   10:49 AM  BMP  Glucose 70 - 99 mg/dL 112  92  153   BUN 8 - 23 mg/dL 34  53  42   Creatinine 0.44 - 1.00 mg/dL 3.25  4.26  3.66   Sodium 135 - 145 mmol/L 135  134  133   Potassium 3.5 - 5.1 mmol/L 3.4  3.5  3.3   Chloride 98 - 111 mmol/L 100  99  97   CO2 22 - 32 mmol/L '28  26  26   '$ Calcium 8.9 - 10.3 mg/dL 8.2  8.3  8.1     LFT    Latest Ref Rng & Units 02/07/2022    7:57 AM 02/06/2022    4:15 AM 02/05/2022    6:02 AM  Hepatic Function  Albumin 3.5 - 5.0 g/dL 2.3  1.9  2.1      STUDIES: DG Abd 1 View  Result Date: 02/09/2022 CLINICAL DATA:  Nausea vomiting EXAM: ABDOMEN - 1 VIEW COMPARISON:  CT abdomen pelvis 02/01/2022 FINDINGS: The bowel gas pattern is normal. No radio-opaque calculi or other  significant radiographic abnormality are seen. IMPRESSION: Negative. Electronically Signed   By: Franchot Gallo M.D.   On: 02/09/2022 16:20   US Paracentesis  Result Date: 02/08/2022 INDICATION: Patient with a history of cirrhosis and recurrent ascites. Interventional radiology asked to perform a therapeutic paracentesis. EXAM: ULTRASOUND GUIDED PARACENTESIS MEDICATIONS: 1% lidocaine 15 mL COMPLICATIONS: None immediate. PROCEDURE: Informed written consent was obtained from the patient after a discussion of the risks, benefits and alternatives to treatment. A  timeout was performed prior to the initiation of the procedure. Initial ultrasound scanning demonstrates a large amount of ascites within the right lower abdominal quadrant. The right lower abdomen was prepped and draped in the usual sterile fashion. 1% lidocaine was used for local anesthesia. Following this, a 6 Fr Safe-T-Centesis catheter was introduced. An ultrasound image was saved for documentation purposes. The paracentesis was performed. The catheter was removed and a dressing was applied. The patient tolerated the procedure well without immediate post procedural complication. FINDINGS: A total of approximately 2.4 L of clear yellow fluid was removed. IMPRESSION: Successful ultrasound-guided paracentesis yielding 2.4 liters of peritoneal fluid. Read by: Soyla Dryer, NP PLAN: The patient has required >/=2 paracenteses in a 30 day period and a formal evaluation by the [facility] has been arranged. Electronically Signed   By: Aletta Edouard M.D.   On: 02/08/2022 16:21      Impression / Plan:   NEFTALI THUROW is a 81 y.o. female with morbid obesity, OSA on CPAP, coronary artery disease s/p PCI, CHF, ESRD on intermittent hemodialysis, paroxysmal A-fib, no longer on anticoagulation, cirrhosis of liver, recurrent ascites requiring therapeutic paracentesis is admitted with shock.  GI is consulted for recurrence of anemia, melena requiring multiple blood transfusions  Acute on chronic iron deficiency anemia with intermittent melena requiring multiple blood transfusions Patient underwent EGD and colonoscopy in 06/2021, revealed medium to large hiatal hernia only No evidence of H. pylori Video capsule endoscopy in 06/2021 was unremarkable Patient had prolonged hospital stay during this admission secondary to shock.  Differentials include erosive esophagitis or erosive gastritis or stress induced ulcers or AVMs Increase Protonix to 40 mg IV twice  daily Recommend push enteroscopy tomorrow, Dr. Alice Reichert is on-call for the weekend N.p.o. effective midnight  I have discussed alternative options, risks & benefits,  which include, but are not limited to, bleeding, infection, perforation,respiratory complication & drug reaction.  The patient agrees with this plan & written consent will be obtained.     Thank you for involving me in the care of this patient.      LOS: 9 days   Sherri Sear, MD  02/10/2022, 2:25 PM    Note: This dictation was prepared with Dragon dictation along with smaller phrase technology. Any transcriptional errors that result from this process are unintentional.

## 2022-02-10 NOTE — Progress Notes (Signed)
Occupational Therapy Treatment Patient Details Name: April [name] MRN: [id number] DOB: 1940-06-24 Today's Date: 02/10/2022   History of present illness Pt is an 81 y/o F admitted on 02/01/22 after presenting to the ED with c/o weakness, N&V, & tremors. Pt is being treated for severe sepsis of unclear etiology. PMH: liver cirrhosis, ESRD on HD TTS, HTN   OT comments  Chart reviewed, pt greeted in chair agreeable to OT tx session. Tx session targeted improving activity tolerance in preparation for improved ADL task completion. SET UP required for grooming tasks in sitting. Pt performs STS 5x, sustaining standing for 30 seconds each time. MAX A required for LB dressing. Rest breaks required throughout, pt able to self direct. Discharge recommendation remains appropriate.    Recommendations for follow up therapy are one component of a multi-disciplinary discharge planning process, led by the attending physician.  Recommendations may be updated based on patient status, additional functional criteria and insurance authorization.    Follow Up Recommendations  Home health OT    Assistance Recommended at Discharge Frequent or constant Supervision/Assistance  Patient can return home with the following  A little help with walking and/or transfers;A lot of help with bathing/dressing/bathroom   Equipment Recommendations  BSC/3in1    Recommendations for Other Services      Precautions / Restrictions Precautions Precautions: Fall Precaution Comments: low BP & MAP Restrictions Weight Bearing Restrictions: No       Mobility Bed Mobility               General bed mobility comments: NT in recliner pre/post session    Transfers Overall transfer level: Needs assistance Equipment used: Rolling walker (2 wheels) Transfers: Sit to/from Stand Sit to Stand: Supervision           General transfer comment: STS 5x, sustained for approx 30 seconds each time     Balance Overall balance  assessment: Needs assistance Sitting-balance support: Feet supported, Bilateral upper extremity supported Sitting balance-Leahy Scale: Fair     Standing balance support: During functional activity, Bilateral upper extremity supported, Reliant on assistive device for balance Standing balance-Leahy Scale: Fair                             ADL either performed or assessed with clinical judgement   ADL Overall ADL's : Needs assistance/impaired Eating/Feeding: Set up;Sitting   Grooming: Wash/dry face;Oral care;Sitting;Set up               Lower Body Dressing: Maximal assistance   Toilet Transfer: Supervision/safety;Rolling walker (2 wheels) Toilet Transfer Details (indicate cue type and reason): simulated                Extremity/Trunk Assessment              Vision       Perception     Praxis      Cognition Arousal/Alertness: Awake/alert Behavior During Therapy: WFL for tasks assessed/performed Overall Cognitive Status: Within Functional Limits for tasks assessed                                          Exercises      Shoulder Instructions       General Comments pt performed several exercises in chair and was encouraged to continue to perform throughout the day to promote return in strength  Pertinent Vitals/ Pain       Pain Assessment Pain Assessment: No/denies pain  Home Living                                          Prior Functioning/Environment              Frequency  Min 2X/week        Progress Toward Goals  OT Goals(current goals can now be found in the care plan section)  Progress towards OT goals: Progressing toward goals     Plan Discharge plan remains appropriate;Frequency remains appropriate    Co-evaluation        PT goals addressed during session: Mobility/safety with mobility;Proper use of DME;Strengthening/ROM;Balance        AM-PAC OT "6 Clicks" Daily  Activity     Outcome Measure   Help from another person eating meals?: None Help from another person taking care of personal grooming?: A Little Help from another person toileting, which includes using toliet, bedpan, or urinal?: A Lot Help from another person bathing (including washing, rinsing, drying)?: A Lot Help from another person to put on and taking off regular upper body clothing?: A Little Help from another person to put on and taking off regular lower body clothing?: A Lot 6 Click Score: 16    End of Session Equipment Utilized During Treatment: Rolling walker (2 wheels)  OT Visit Diagnosis: Unsteadiness on feet (R26.81);Muscle weakness (generalized) (M62.81)   Activity Tolerance Patient tolerated treatment well   Patient Left in chair;with call bell/phone within reach   Nurse Communication          Time: 2197-5883 OT Time Calculation (min): 25 min  Charges: OT General Charges $OT Visit: 1 Visit OT Treatments $Self Care/Home Management : 8-22 mins $Therapeutic Activity: 8-22 mins Shanon Payor, OTD OTR/L  02/10/22, 1:43 PM

## 2022-02-10 NOTE — Progress Notes (Signed)
PROGRESS NOTE    April [name]  ZHY:[id number] DOB: [birth year] DOA: 02/01/2022 PCP: Einar Pheasant, MD  101A/101A-AA  LOS: 9 days   Brief hospital course:   Assessment & Plan: April [name] is an 81 year old female with history of liver cirrhosis, end-stage renal disease on hemodialysis Tuesday Thursday Saturday, hypertension, who presents emergency department for chief concerns of weakness and tremors.   CT abdomen pelvis without contrast: Was read as cirrhotic liver with moderate volume ascites.  Small left and trace right pleural effusion with associated compressive atelectasis.  Moderate large hiatal hernia containing majority of the stomach within the chest.  Aortic atherosclerosis.  Anasarca.   Patient was hypotension was placed in the ICU currently on IV levophed. Lactic acid was elevated panel on empiric IV antibiotics. No fever.   Anasarca/ascites  Cirrhosis of liver (from radiologic studies) --10/6 paracentesis 2.2 liter, fluid neg for SBP--d/c'ed abxs --10/11 paracentesis with 2.4L removed. Plan: --cont HD for fluid removal --cont torsemide 80 mg daily --pt to see Dr Riki Rusk as outpt  Severe anemia  --with melena, presumed due to GI bleed, however, recent extensive w/u in March 2023 without obvious source of bleeding.  Recent anemia workup showed no def in vit B12, folate and iron. --s/p 3u pRBC. --re-engaged GI to consider repeat endoluminal studies Plan: --push enteroscopy tomorrow --may need outpatient blood transfusion PRN  Severe sepsis, ruled out --no source of infection found.   Acute on Chronic Diastolic CHF --2D echo on 12/08/2021 showed EF of 55-60% with grade 2 diastolic dysfunction.  Patient has 1+ leg edema, but no JVD, no pulmonary edema on chest x-ray.   --cont torsemide as 80 mg daily, per nephro -Fluid management per renal by dialysis   ESRD on dialysis ([place]) --iHD per nephro   Hypokalemia  H/o HTN, not currently active Hypotension --off  levophed --cont midodrine 10 mg TID   OSA on CPAP - CPAP nightly ordered   Paroxysmal atrial fibrillation ([place]) - Patient is not on anticoagulation due to her severe anemia requiring BT's - Currently NSR --cont home amiodarone    Morbid obesity ([place]) - complicates overall care and prognosis.    H/o recent PE --not on any anticoagulation due to anemia requiring freq BT     DVT prophylaxis: SCD/Compression stockings Code Status: DNR  Family Communication:  Level of care: Med-Surg Dispo:   The patient is from: home Anticipated d/c is to: home Anticipated d/c date is: 2-3 days   Subjective and Interval History:  Pt reported feeling better today.   Objective: Vitals:   02/10/22 0429 02/10/22 0725 02/10/22 1558 02/10/22 1942  BP: (!) 114/50 (!) 102/42 (!) 118/51 (!) 132/54  Pulse: 79 71 70 71  Resp: '18 16 17 18  '$ Temp: 99.1 F (37.3 C) 99 F (37.2 C) 98.1 F (36.7 C) 98.1 F (36.7 C)  TempSrc:      SpO2: 94% 94% 96% 98%  Weight:      Height:        Intake/Output Summary (Last 24 hours) at 02/10/2022 2007 Last data filed at 02/10/2022 1600 Gross per 24 hour  Intake 414.39 ml  Output --  Net 414.39 ml   Filed Weights   02/07/22 0759 02/07/22 1146 02/09/22 1339  Weight: 77.5 kg 75.5 kg 71.3 kg    Examination:   Constitutional: NAD, AAOx3 HEENT: conjunctivae and lids normal, EOMI CV: No cyanosis.   RESP: normal respiratory effort, on RA Neuro: II - XII grossly intact.   Psych: Normal mood  and affect.  Appropriate judgement and reason   Data Reviewed: I have personally reviewed labs and imaging studies  Time spent: 35 minutes  Enzo Bi, MD [facility] If 7PM-7AM, please contact night-coverage 02/10/2022, 8:07 PM

## 2022-02-10 NOTE — Consult Note (Signed)
Cephas Darby, MD [address]  [place]  [place], [place] [address]  Main: [phone]  Fax: [phone] Pager: [phone]   Consultation  Referring Provider:     No ref. provider found Primary Care Physician:  Einar Pheasant, MD Primary Gastroenterologist:  Dr. Alice Reichert     Reason for Consultation: Melena, acute on chronic anemia  Date of Admission:  02/01/2022 Date of Consultation:  02/10/2022         HPI:   UDELL BLASINGAME is a 81 y.o. female coronary disease s/p PCI with DES, CHF, ESRD on hemodialysis, chronic iron deficiency anemia hemodialysis, decompensated cirrhosis of liver is generally admitted on 02/01/2022 secondary to generalized weakness, shock, treated with empiric antibiotics and vasopressors.  History of paroxysmal A-fib, no longer on anticoagulation since April 2023 due to recurrent anemia requiring multiple blood transfusions.  Patient's hemoglobin was 7.4 on admission, dropped to 6.6 on 10/5, received blood transfusion, again dropped to 6.1 on 10/10, further dropped to 6.6 on 10/12.  Patient received 3 units of PRBCs during this admission.  She responds appropriately to blood transfusion.  GI is consulted due to witnessed episodes of multiple episodes of small amounts of soft black stools on 10/11, resulting in drop in hemoglobin and recurrent anemia.  Patient does have iron deficiency.  Currently on Protonix 40 mg IV daily.  Patient is sitting in the chair, mild short of breath speaking in full sentences  NSAIDs: None  Antiplts/Anticoagulants/Anti thrombotics: None  GI Procedures:  Upper endoscopy 07/10/2021 - Normal duodenal bulb and second portion of the duodenum. - Erythematous mucosa in the antrum and prepyloric region of the stomach. Biopsied. - Medium-sized hiatal hernia. - Normal gastroesophageal junction and esophagus. DIAGNOSIS:  A.  STOMACH, RANDOM; COLD BIOPSY:  - REACTIVE GASTRITIS.  - NEGATIVE FOR H. PYLORI, INTESTINAL METAPLASIA,  DYSPLASIA, AND  MALIGNANCY.   Colonoscopy 07/11/2021 - Three 2 to 4 mm polyps in the transverse colon, removed with a cold biopsy forceps. Resected and retrieved. - One 5 mm polyp in the descending colon, removed with a cold biopsy forceps. Resected and retrieved. Clip (MR conditional) was placed. - Diverticulosis in the sigmoid colon. - Non-bleeding internal hemorrhoids. - The examined portion of the ileum was normal. - Three 2 to 4 mm polyps in the transverse colon, removed with a cold biopsy forceps. Resected and retrieved. - One 5 mm polyp in the descending colon, removed with a cold biopsy forceps. Resected and retrieved. Clip (MR conditional) was placed. - Diverticulosis in the sigmoid colon. - Non-bleeding internal hemorrhoids. - The examined portion of the ileum was normal.  Video capsule endoscopy 07/11/2021 Normal  Upper endoscopy 03/17/2015 - Normal examined duodenum. - Normal stomach. - Medium-sized hiatus hernia. - Erosive gastropathy. Biopsied. - Gastritis. Biopsied. - Normal esophagus.  DIAGNOSIS:  A. STOMACH, ANTRUM; COLD BIOPSY:  - REACTIVE GASTROPATHY IN A 1 MM SUPERFICIAL SAMPLE (ADDITIONAL DEEPER  SECTIONS REVIEWED).  - NEGATIVE FOR HELICOBACTER PYLORI IN HEMATOXYLIN AND EOSIN SECTIONS.   B. STOMACH, CARDIA; COLD BIOPSY:  - MODERATE CHRONIC GASTRITIS WITH MINIMAL ACTIVITY.  - NEGATIVE FOR INTESTINAL METAPLASIA, DYSPLASIA, AND MALIGNANCY.   Past Medical History:  Diagnosis Date   Anemia    CAD S/P PCI pRCA Promus DES 3.5 x 16 (4.1 mm), ostRPDA Promus DES 2.5 x 12 (2.7 mm) 03/25/2016   CAD S/P percutaneous coronary angioplasty    a. 07/2015 MV: No ischemia, EF 66%;  b. 03/2016 Inflat STEMI/PCI: LM nl, LAD 25d, RI  nl, LCX nl, OM1/2 nl, RCA 80p (3.5x16 Promus Premier DES), 50p/m, 30d, RPDA 90 (2.5x12 Promu Premier DES); c. 01/2018 Cath ([place], [place]): Patent RCA stents->Med Rx.   CHF (congestive heart failure) ([place])    CKD (chronic kidney  disease), stage III ([place])    Diabetes mellitus without complication ([place])    Diastolic dysfunction    a. 10/2013 Echo: EF 55-65%, Gr1 DD; b. 01/2018 Echo ([place], [place]): EF 55-60%, Gr2 DD, RVSP 35mHg.   Diverticulitis    Dyspnea    Endometriosis    Family history of adverse reaction to anesthesia    Mother - had to stay in ICU because of breathing difficulties every time she had anesthesia   GERD (gastroesophageal reflux disease)    GI bleed    a. 06/2021 - melena-->2 u PRBCs.   Hiatal hernia    History of colon polyps 08/1994   History of Migraines    Hypercholesterolemia    Hypertension    LBBB (left bundle branch block)    Osteoporosis    PAF (paroxysmal atrial fibrillation) ([place]    a. 03/2016 Dx @ time of MI; b. CHA2DS2VASc = 5-->Eliquis.   Rheumatic fever    Sleep apnea    CPAP   Spasmodic dysphonia    ST elevation myocardial infarction (STEMI) of inferolateral wall, [place] of [place] ([facility] 03/25/2016   ST elevation myocardial infarction (STEMI) of inferolateral wall, initial episode of [facility] 03/25/2016   Urine incontinence     Past Surgical History:  Procedure Laterality Date   ABDOMINAL HYSTERECTOMY     ovaries not removed   APPENDECTOMY     was removed during hysterectomy   Breast biopsies     x2   BREAST EXCISIONAL BIOPSY Bilateral "years ago"   neg   BREAST SURGERY Bilateral    BROW LIFT Bilateral 08/15/2019   Procedure: BLEPHAROPLASTY UPPER EYELID; W/EXCESS SKIN BLEPHAROPTOSIS REPAIR; RESECT EX;  Surgeon: FKarle Starch MD;  [place] [facility]  Service: Ophthalmology;  Laterality: Bilateral;  sleep apnea   CARDIAC CATHETERIZATION  2011   moderate 40% RCA disease   CARDIAC CATHETERIZATION  01/2010   Dr. Gollan'@[facility]'$ : Only noted 40% RCA   CARDIAC CATHETERIZATION N/A 03/25/2016   Procedure: Left Heart Cath and Coronary Angiography;  Surgeon: D12/11/2015 MD;  [place];  Service: Cardiovascular;   Laterality: N/A;   CARDIAC CATHETERIZATION N/A 03/25/2016   Procedure: Coronary Stent Intervention;  Surgeon: D12/11/2015 MD;  [place];  Service: Cardiovascular;  Laterality: N/A;   COLONOSCOPY  2013   COLONOSCOPY WITH PROPOFOL N/A 07/11/2021   Procedure: COLONOSCOPY WITH PROPOFOL;  Surgeon: W3/26/2023 MD;  [facility];  Service: Endoscopy;  Laterality: N/A;   DIALYSIS/PERMA CATHETER INSERTION N/A 12/15/2021   Procedure: DIALYSIS/PERMA CATHETER INSERTION;  Surgeon: D8/30/2023 MD;  [facility];  Service: Cardiovascular;  Laterality: N/A;   ESOPHAGOGASTRODUODENOSCOPY (EGD) WITH PROPOFOL N/A 03/17/2015   Procedure: ESOPHAGOGASTRODUODENOSCOPY (EGD) WITH PROPOFOL;  Surgeon: S11/29/2016 MD;  Location: [facility] ENDOSCOPY;  Service: Gastroenterology;  Laterality: N/A;   ESOPHAGOGASTRODUODENOSCOPY (EGD) WITH PROPOFOL N/A 07/10/2021   Procedure: ESOPHAGOGASTRODUODENOSCOPY (EGD) WITH PROPOFOL;  Surgeon: V16/03/2022 MD;  [facility];  Service: Gastroenterology;  Laterality: N/A;   GIVENS CAPSULE STUDY N/A 07/11/2021   Procedure: GIVENS CAPSULE STUDY;  Surgeon: W3/26/2023 MD;  [facility];  Service: Endoscopy;  Laterality: N/A;  NM MYOVIEW ([place] [facility])  07/2015   No evidence ischemia or infarction. EF 66%. Low risk   TEMPORARY DIALYSIS CATHETER N/A 12/08/2021   Procedure: TEMPORARY DIALYSIS CATHETER;  Surgeon: Algernon Huxley, MD;  Location: [place] CV LAB;  Service: Cardiovascular;  Laterality: N/A;   TONSILLECTOMY     TRANSTHORACIC ECHOCARDIOGRAM  10/2013   Normal LV size and function. EF 55-65%. GR 1 DD. Otherwise normal.     Current Facility-Administered Medications:    0.9 %  sodium chloride infusion (Manually program via Guardrails IV Fluids), , Intravenous, Once, Fritzi Mandes, MD   0.9 %  sodium chloride infusion, 250 mL, Intravenous, Continuous, Foust, Katy L, NP, Paused at 02/02/22 0400    acetaminophen (TYLENOL) tablet 650 mg, 650 mg, Oral, Q6H PRN, 650 mg at 02/10/22 0430 **OR** acetaminophen (TYLENOL) suppository 650 mg, 650 mg, Rectal, Q6H PRN, Cox, Amy N, DO   albumin human 25 % solution 12.5 g, 12.5 g, Intravenous, PRN, Cox, Amy N, DO, Stopped at 02/08/22 2213   amiodarone (PACERONE) tablet 200 mg, 200 mg, Oral, Daily, Enzo Bi, MD, 200 mg at 02/10/22 6222   Chlorhexidine Gluconate Cloth 2 % PADS 6 each, 6 each, Topical, Daily, Enzo Bi, MD, 6 each at 02/10/22 9798   diclofenac Sodium (VOLTAREN) 1 % topical gel 2 g, 2 g, Topical, TID PRN, Cox, Amy N, DO   docusate sodium (COLACE) capsule 100 mg, 100 mg, Oral, BID, Cox, Amy N, DO, 100 mg at 02/10/22 9211   epoetin alfa (EPOGEN) injection 8,000 Units, 8,000 Units, Intravenous, Q T,Th,Sa-HD, Murlean Iba, MD, 8,000 Units at 02/09/22 1136   feeding supplement (PRO-STAT SUGAR FREE 64) liquid 30 mL, 30 mL, Oral, TID WC, Cox, Amy N, DO, 30 mL at 02/09/22 0737   ipratropium-albuterol (DUONEB) 0.5-2.5 (3) MG/3ML nebulizer solution 3 mL, 3 mL, Nebulization, Q6H PRN, Cox, Amy N, DO   iron sucrose (VENOFER) 300 mg in sodium chloride 0.9 % 250 mL IVPB, 300 mg, Intravenous, Once, Maeven Mcdougall, Tally Due, MD   lidocaine (LIDODERM) 5 % 1 patch, 1 patch, Transdermal, Q24H, Cox, Amy N, DO   midodrine (PROAMATINE) tablet 10 mg, 10 mg, Oral, TID WC, Rust-[place], Britton L, NP, 10 mg at 02/10/22 1138   multivitamin (RENA-VIT) tablet 1 tablet, 1 tablet, Oral, QHS, Fritzi Mandes, MD, 1 tablet at 02/09/22 2111   nitroGLYCERIN (NITROSTAT) SL tablet 0.4 mg, 0.4 mg, Sublingual, Q5 min PRN, Cox, Amy N, DO   ondansetron (ZOFRAN) tablet 4 mg, 4 mg, Oral, Q6H PRN **OR** ondansetron (ZOFRAN) injection 4 mg, 4 mg, Intravenous, Q6H PRN, Cox, Amy N, DO, 4 mg at 02/09/22 1459   pantoprazole (PROTONIX) EC tablet 40 mg, 40 mg, Oral, BID AC, Sheehan Stacey, Tally Due, MD   phenol (CHLORASEPTIC) mouth spray 1 spray, 1 spray, Mouth/Throat, PRN, Fritzi Mandes, MD, 1 spray at  02/04/22 1237   rifaximin (XIFAXAN) tablet 200 mg, 200 mg, Oral, BID, Cox, Amy N, DO, 200 mg at 02/10/22 9417   torsemide (DEMADEX) tablet 80 mg, 80 mg, Oral, Daily, Breeze, Shantelle, NP, 80 mg at 02/10/22 0813   Family History  Problem Relation Age of Onset   Arthritis Mother    Stroke Mother    Hypertension Mother    Osteoporosis Mother    Heart disease Father        MI   Heart attack Father    Stroke Brother    Heart attack Sister    Breast cancer Other  first cousin x 2   Stroke Daughter    Heart attack Daughter    Colon cancer Neg Hx      Social History   Tobacco Use   Smoking status: Never   Smokeless tobacco: Never  Vaping Use   Vaping Use: Never used  Substance Use Topics   Alcohol use: No    Alcohol/week: 0.0 standard drinks of alcohol   Drug use: No    Allergies as of 02/01/2022 - Review Complete 02/01/2022  Allergen Reaction Noted   Penicillins Other (See Comments) 10/11/2012   Sulfa antibiotics Rash 07/08/2012    Review of Systems:    All systems reviewed and negative except where noted in HPI.   Physical Exam:  Vital signs in last 24 hours: Temp:  [98.1 F (36.7 C)-99.1 F (37.3 C)] 99 F (37.2 C) (10/13 0725) Pulse Rate:  [71-90] 71 (10/13 0725) Resp:  [16-18] 16 (10/13 0725) BP: (102-116)/(42-52) 102/42 (10/13 0725) SpO2:  [94 %-98 %] 94 % (10/13 0725) Last BM Date : 02/09/22 General:   Pleasant, cooperative in NAD Head:  Normocephalic and atraumatic. Eyes:   No icterus.   Conjunctiva pink. PERRLA. Ears:  Normal auditory acuity. Neck:  Supple; no masses or thyroidomegaly Lungs: Respirations even and unlabored. Lungs clear to auscultation bilaterally.   No wheezes, crackles, or rhonchi.  Heart:  Regular rate and rhythm;  Without murmur, clicks, rubs or gallops Abdomen:  Soft, distended, nontender. Normal bowel sounds. No appreciable masses or hepatomegaly.  No rebound or guarding.  Rectal:  Not performed. Msk:  Symmetrical without  gross deformities.  Strength generalized weakness Extremities: 2+ edema, no cyanosis or clubbing. Neurologic:  Alert and oriented x3;  grossly normal neurologically. Skin:  Intact without significant lesions or rashes. Psych:  Alert and cooperative. Normal affect.  LAB RESULTS:    Latest Ref Rng & Units 02/10/2022    7:09 AM 02/09/2022    7:08 PM 02/09/2022    4:17 AM  CBC  WBC 4.0 - 10.5 K/uL 13.5   11.6   Hemoglobin 12.0 - 15.0 g/dL 8.0  8.4  6.6   Hematocrit 36.0 - 46.0 % 23.7  25.1  19.9   Platelets 150 - 400 K/uL 104   112     BMET    Latest Ref Rng & Units 02/10/2022    7:09 AM 02/09/2022    4:17 AM 02/08/2022   10:49 AM  BMP  Glucose 70 - 99 mg/dL 112  92  153   BUN 8 - 23 mg/dL 34  53  42   Creatinine 0.44 - 1.00 mg/dL 3.25  4.26  3.66   Sodium 135 - 145 mmol/L 135  134  133   Potassium 3.5 - 5.1 mmol/L 3.4  3.5  3.3   Chloride 98 - 111 mmol/L 100  99  97   CO2 22 - 32 mmol/L '28  26  26   '$ Calcium 8.9 - 10.3 mg/dL 8.2  8.3  8.1     LFT    Latest Ref Rng & Units 02/07/2022    7:57 AM 02/06/2022    4:15 AM 02/05/2022    6:02 AM  Hepatic Function  Albumin 3.5 - 5.0 g/dL 2.3  1.9  2.1      STUDIES: DG Abd 1 View  Result Date: 02/09/2022 CLINICAL DATA:  Nausea vomiting EXAM: ABDOMEN - 1 VIEW COMPARISON:  CT abdomen pelvis 02/01/2022 FINDINGS: The bowel gas pattern is normal. No radio-opaque calculi or other  significant radiographic abnormality are seen. IMPRESSION: Negative. Electronically Signed   By: Franchot Gallo M.D.   On: 02/09/2022 16:20   US Paracentesis  Result Date: 02/08/2022 INDICATION: Patient with a history of cirrhosis and recurrent ascites. Interventional radiology asked to perform a therapeutic paracentesis. EXAM: ULTRASOUND GUIDED PARACENTESIS MEDICATIONS: 1% lidocaine 15 mL COMPLICATIONS: None immediate. PROCEDURE: Informed written consent was obtained from the patient after a discussion of the risks, benefits and alternatives to treatment. A  timeout was performed prior to the initiation of the procedure. Initial ultrasound scanning demonstrates a large amount of ascites within the right lower abdominal quadrant. The right lower abdomen was prepped and draped in the usual sterile fashion. 1% lidocaine was used for local anesthesia. Following this, a 6 Fr Safe-T-Centesis catheter was introduced. An ultrasound image was saved for documentation purposes. The paracentesis was performed. The catheter was removed and a dressing was applied. The patient tolerated the procedure well without immediate post procedural complication. FINDINGS: A total of approximately 2.4 L of clear yellow fluid was removed. IMPRESSION: Successful ultrasound-guided paracentesis yielding 2.4 liters of peritoneal fluid. Read by: Soyla Dryer, NP PLAN: The patient has required >/=2 paracenteses in a 30 day period and a formal evaluation by the [facility] has been arranged. Electronically Signed   By: Aletta Edouard M.D.   On: 02/08/2022 16:21      Impression / Plan:   MARGUETTA WINDISH is a 81 y.o. female with morbid obesity, OSA on CPAP, coronary artery disease s/p PCI, CHF, ESRD on intermittent hemodialysis, paroxysmal A-fib, no longer on anticoagulation, cirrhosis of liver, recurrent ascites requiring therapeutic paracentesis is admitted with shock.  GI is consulted for recurrence of anemia, melena requiring multiple blood transfusions  Acute on chronic iron deficiency anemia with intermittent melena requiring multiple blood transfusions Patient underwent EGD and colonoscopy in 06/2021, revealed medium to large hiatal hernia only No evidence of H. pylori Video capsule endoscopy in 06/2021 was unremarkable Patient had prolonged hospital stay during this admission secondary to shock.  Differentials include erosive esophagitis or erosive gastritis or stress induced ulcers or AVMs Increase Protonix to 40 mg IV twice  daily Recommend push enteroscopy tomorrow, Dr. Alice Reichert is on-call for the weekend N.p.o. effective midnight  I have discussed alternative options, risks & benefits,  which include, but are not limited to, bleeding, infection, perforation,respiratory complication & drug reaction.  The patient agrees with this plan & written consent will be obtained.     Thank you for involving me in the care of this patient.      LOS: 9 days   Sherri Sear, MD  02/10/2022, 2:25 PM    Note: This dictation was prepared with Dragon dictation along with smaller phrase technology. Any transcriptional errors that result from this process are unintentional.

## 2022-02-11 ENCOUNTER — Inpatient Hospital Stay: Payer: Medicare Other | Admitting: Anesthesiology

## 2022-02-11 ENCOUNTER — Encounter: Payer: Self-pay | Admitting: Internal Medicine

## 2022-02-11 ENCOUNTER — Encounter
Admission: EM | Disposition: A | Discharge status: Home Health | Payer: Self-pay | Source: Home / Self Care | Attending: Internal Medicine

## 2022-02-11 DIAGNOSIS — A419 Sepsis, unspecified organism: Secondary | ICD-10-CM | POA: Diagnosis not present

## 2022-02-11 DIAGNOSIS — R6520 Severe sepsis without septic shock: Secondary | ICD-10-CM | POA: Diagnosis not present

## 2022-02-11 HISTORY — PX: ENTEROSCOPY: SHX5533

## 2022-02-11 LAB — BASIC METABOLIC PANEL
Anion gap: 7 (ref 5–15)
BUN: 47 mg/dL — ABNORMAL HIGH (ref 8–23)
CO2: 26 mmol/L (ref 22–32)
Calcium: 8.1 mg/dL — ABNORMAL LOW (ref 8.9–10.3)
Chloride: 99 mmol/L (ref 98–111)
Creatinine, Ser: 4.15 mg/dL — ABNORMAL HIGH (ref 0.44–1.00)
GFR, Estimated: 10 mL/min — ABNORMAL LOW (ref >60)
Glucose, Bld: 92 mg/dL (ref 70–99)
Potassium: 3.5 mmol/L (ref 3.5–5.1)
Sodium: 132 mmol/L — ABNORMAL LOW (ref 135–145)

## 2022-02-11 LAB — CBC
HCT: 22.9 % — ABNORMAL LOW (ref 36.0–46.0)
Hemoglobin: 7.6 g/dL — ABNORMAL LOW (ref 12.0–15.0)
MCH: 31.5 pg (ref 26.0–34.0)
MCHC: 33.2 g/dL (ref 30.0–36.0)
MCV: 95 fL (ref 80.0–100.0)
Platelets: 104 10*3/uL — ABNORMAL LOW (ref 150–400)
RBC: 2.41 MIL/uL — ABNORMAL LOW (ref 3.87–5.11)
RDW: 19.1 % — ABNORMAL HIGH (ref 11.5–15.5)
WBC: 12.5 10*3/uL — ABNORMAL HIGH (ref 4.0–10.5)
nRBC: 0.2 % (ref 0.0–0.2)

## 2022-02-11 LAB — MAGNESIUM: Magnesium: 1.8 mg/dL (ref 1.7–2.4)

## 2022-02-11 SURGERY — ENTEROSCOPY
Anesthesia: General

## 2022-02-11 MED ORDER — LIDOCAINE HCL (CARDIAC) PF 100 MG/5ML IV SOSY
PREFILLED_SYRINGE | INTRAVENOUS | Status: DC | PRN
  Administered 2022-02-11: 40 mg via INTRAVENOUS

## 2022-02-11 MED ORDER — GLYCOPYRROLATE 0.2 MG/ML IJ SOLN
INTRAMUSCULAR | Status: DC | PRN
  Administered 2022-02-11: .1 mg via INTRAVENOUS

## 2022-02-11 MED ORDER — GLYCOPYRROLATE 0.2 MG/ML IJ SOLN
INTRAMUSCULAR | Status: AC
  Filled 2022-02-11: qty 1

## 2022-02-11 MED ORDER — PHENYLEPHRINE 80 MCG/ML (10ML) SYRINGE FOR IV PUSH (FOR BLOOD PRESSURE SUPPORT)
PREFILLED_SYRINGE | INTRAVENOUS | Status: AC
  Filled 2022-02-11: qty 10

## 2022-02-11 MED ORDER — EPOETIN ALFA 4000 UNIT/ML IJ SOLN
INTRAMUSCULAR | Status: AC
  Filled 2022-02-11: qty 2

## 2022-02-11 MED ORDER — PROPOFOL 10 MG/ML IV BOLUS
INTRAVENOUS | Status: DC | PRN
  Administered 2022-02-11: 30 mg via INTRAVENOUS
  Administered 2022-02-11: 20 mg via INTRAVENOUS
  Administered 2022-02-11 (×2): 30 mg via INTRAVENOUS
  Administered 2022-02-11 (×2): 20 mg via INTRAVENOUS
  Administered 2022-02-11 (×2): 30 mg via INTRAVENOUS
  Administered 2022-02-11 (×2): 20 mg via INTRAVENOUS

## 2022-02-11 MED ORDER — PROPOFOL 10 MG/ML IV BOLUS
INTRAVENOUS | Status: AC
  Filled 2022-02-11: qty 40

## 2022-02-11 MED ORDER — PHENYLEPHRINE HCL (PRESSORS) 10 MG/ML IV SOLN
INTRAVENOUS | Status: DC | PRN
  Administered 2022-02-11 (×2): 100 ug via INTRAVENOUS

## 2022-02-11 MED ORDER — LIDOCAINE HCL (PF) 2 % IJ SOLN
INTRAMUSCULAR | Status: AC
  Filled 2022-02-11: qty 5

## 2022-02-11 NOTE — Progress Notes (Signed)
Pt requests to keep UF off remainder of HD treatment. Feels nauseous post procedure. Theador Hawthorne, MD notified.

## 2022-02-11 NOTE — Interval H&P Note (Signed)
History and Physical Interval Note:  02/11/2022 9:06 AM  April [name]  has presented today for surgery, with the diagnosis of Melena, acute on chronic anemia.  The various methods of treatment have been discussed with the patient and family. After consideration of risks, benefits and other options for treatment, the patient has consented to  Procedure(s): ENTEROSCOPY (N/A) as a surgical intervention.  The patient's history has been reviewed, patient examined, no change in status, stable for surgery.  I have reviewed the patient's chart and labs.  Questions were answered to the patient's satisfaction.     [place], [place]

## 2022-02-11 NOTE — Progress Notes (Signed)
[facility]  ROUNDING NOTE   Subjective:   April [name] is a 81 year old female with past medical history including liver cirrhosis, anemia, diverticulitis, hypertension, CHF, CAD, and end stage renal disease on hemodialysis. She presents to the emergency department with weakness and vomiting. She has been admitted for Lactic acidosis [E87.20] Abdominal distension [R14.0] Severe sepsis (HCC) [A41.9, R65.20] Hypotension, unspecified hypotension type [I95.9] Cirrhosis of liver with ascites, unspecified hepatic cirrhosis type ([place]) [K74.60, R18.8] Other hypervolemia [E87.79]  This patient is known to our clinic and received outpatient dialysis treatments at [facility] on a TTS schedule,    Update:  Patient seen resting quietly, no family at bedside Remains on room air Continues to complain of weakness and fatigue  Objective:  Vital signs in last 24 hours:  Temp:  [97.6 F (36.4 C)-99 F (37.2 C)] 97.6 F (36.4 C) (10/14 0500) Pulse Rate:  [66-71] 66 (10/14 0500) Resp:  [16-18] 18 (10/14 0500) BP: (102-132)/(42-54) 105/45 (10/14 0500) SpO2:  [94 %-98 %] 98 % (10/14 0500)  Weight change:  Filed Weights   02/07/22 0759 02/07/22 1146 02/09/22 1339  Weight: 77.5 kg 75.5 kg 71.3 kg    Intake/Output: I/O last 3 completed shifts: In: 865.4 [P.O.:237; I.V.:15.9; Blood:340; IV Piggyback:272.6] Out: 2200 [Other:2200]   Intake/Output this shift:  No intake/output data recorded.  Physical Exam: General: NAD  Head: Normocephalic, atraumatic. Moist oral mucosal membranes  Eyes: Anicteric  Lungs:  Clear to auscultation, normal effort, room air  Heart: Regular rate and rhythm  Abdomen:  Soft, nontender  Extremities:  1+  peripheral edema  Neurologic: Nonfocal, moving all four extremities  Skin: No lesions  Access: Rt chest permcath    Basic Metabolic Panel: Recent Labs  Lab 02/05/22 0602 02/06/22 0415 02/07/22 0757 02/08/22 1049 02/09/22 0417  02/10/22 0709 02/11/22 0419  NA 130* 132* 130* 133* 134* 135 132*  K 3.3* 3.6 3.3* 3.3* 3.5 3.4* 3.5  CL 96* 97* 96* 97* 99 100 99  CO2 '27 28 26 26 26 28 26  '$ GLUCOSE 119* 123* 112* 153* 92 112* 92  BUN 31* 41* 57* 42* 53* 34* 47*  CREATININE 2.87* 3.75* 4.55* 3.66* 4.26* 3.25* 4.15*  CALCIUM 8.2* 8.1* 7.9* 8.1* 8.3* 8.2* 8.1*  MG  --   --   --  1.7 1.9 1.7 1.8  PHOS 2.1* 3.0 3.4  --   --   --   --     Liver Function Tests: Recent Labs  Lab 02/05/22 0602 02/06/22 0415 02/07/22 0757  ALBUMIN 2.1* 1.9* 2.3*   No results for input(s): "LIPASE", "AMYLASE" in the last 168 hours. No results for input(s): "AMMONIA" in the last 168 hours.   CBC: Recent Labs  Lab 02/07/22 0757 02/07/22 1506 02/08/22 1049 02/09/22 0417 02/09/22 1908 02/10/22 0709 02/11/22 0419  WBC 12.2*  --  12.2* 11.6*  --  13.5* 12.5*  HGB 6.1*   < > 8.0* 6.6* 8.4* 8.0* 7.6*  HCT 18.0*   < > 23.9* 19.9* 25.1* 23.7* 22.9*  MCV 93.8  --  91.2 92.1  --  94.4 95.0  PLT 105*  --  107* 112*  --  104* 104*   < > = values in this interval not displayed.    Cardiac Enzymes: No results for input(s): "CKTOTAL", "CKMB", "CKMBINDEX", "TROPONINI" in the last 168 hours.  BNP: Invalid input(s): "POCBNP"  CBG: No results for input(s): "GLUCAP" in the last 168 hours.  Microbiology: Results for orders  placed or performed during the hospital encounter of 02/01/22  Culture, blood (Routine x 2)     Status: None   Collection Time: 02/01/22 11:53 AM   Specimen: BLOOD  Result Value Ref Range Status   Specimen Description BLOOD LEFT ANTECUBITAL  Final   Special Requests   Final    BOTTLES DRAWN AEROBIC AND ANAEROBIC Blood Culture results may not be optimal due to an excessive volume of blood received in culture bottles   Culture   Final    NO GROWTH 5 DAYS Performed at [facility], [address] [place], [place] [address]    Report Status 02/06/2022 FINAL  Final  Culture, blood (Routine x 2)     Status:  None   Collection Time: 02/01/22  1:16 PM   Specimen: BLOOD  Result Value Ref Range Status   Specimen Description BLOOD BLOOD LEFT FOREARM  Final   Special Requests BLOOD LEFT ARM Blood Culture adequate volume  Final   Culture   Final    NO GROWTH 5 DAYS Performed at [facility], [address] [place], [place] [address]    Report Status 02/06/2022 FINAL  Final  Resp Panel by RT-PCR (Flu A&B, Covid) Anterior Nasal Swab     Status: None   Collection Time: 02/01/22  1:17 PM   Specimen: Anterior Nasal Swab  Result Value Ref Range Status   SARS Coronavirus 2 by RT PCR NEGATIVE NEGATIVE Final    Comment: (NOTE) SARS-CoV-2 target nucleic acids are NOT DETECTED.  The SARS-CoV-2 RNA is generally detectable in upper respiratory specimens during the acute phase of infection. The lowest concentration of SARS-CoV-2 viral copies this assay can detect is 138 copies/mL. A negative result does not preclude SARS-Cov-2 infection and should not be used as the sole basis for treatment or other patient management decisions. A negative result may occur with  improper specimen collection/handling, submission of specimen other than nasopharyngeal swab, presence of viral mutation(s) within the areas targeted by this assay, and inadequate number of viral copies(<138 copies/mL). A negative result must be combined with clinical observations, patient history, and epidemiological information. The expected result is Negative.  Fact Sheet for Patients:  EntrepreneurPulse.com.au  Fact Sheet for Healthcare Providers:  IncredibleEmployment.be  This test is no t yet approved or cleared by the [place] FDA and  has been authorized for detection and/or diagnosis of SARS-CoV-2 by FDA under an Emergency Use Authorization (EUA). This EUA will remain  in effect (meaning this test can be used) for the duration of the COVID-19 declaration under Section 564(b)(1) of  the Act, 21 U.S.C.section 360bbb-3(b)(1), unless the authorization is terminated  or revoked sooner.       Influenza A by PCR NEGATIVE NEGATIVE Final   Influenza B by PCR NEGATIVE NEGATIVE Final    Comment: (NOTE) The Xpert Xpress SARS-CoV-2/FLU/RSV plus assay is intended as an aid in the diagnosis of influenza from Nasopharyngeal swab specimens and should not be used as a sole basis for treatment. Nasal washings and aspirates are unacceptable for Xpert Xpress SARS-CoV-2/FLU/RSV testing.  Fact Sheet for Patients: EntrepreneurPulse.com.au  Fact Sheet for Healthcare Providers: IncredibleEmployment.be  This test is not yet approved or cleared by the [place] FDA and has been authorized for detection and/or diagnosis of SARS-CoV-2 by FDA under an Emergency Use Authorization (EUA). This EUA will remain in effect (meaning this test can be used) for the duration of the COVID-19 declaration under Section 564(b)(1) of the Act, 21 U.S.C.  section 360bbb-3(b)(1), unless the authorization is terminated or revoked.  Performed at [facility], [address], [place], [place] [address]   Peritoneal fluid culture w Gram Stain     Status: None   Collection Time: 02/01/22  4:59 PM   Specimen: Peritoneal Washings; Peritoneal Fluid  Result Value Ref Range Status   Specimen Description   Final    PERITONEAL Performed at [facility], [address] [place], [place] [address]    Special Requests   Final    NONE Performed at [facility], [place], [place] [address]    Gram Stain NO ORGANISMS SEEN NO WBC SEEN   Final   Culture   Final    NO GROWTH 3 DAYS Performed at [facility], [place] [address] [place], [place] [address]    Report Status 02/05/2022 FINAL  Final  MRSA Next Gen by PCR, Nasal     Status: None   Collection Time: 02/01/22 10:53 PM   Specimen: Nasal Mucosa; Nasal Swab  Result Value  Ref Range Status   MRSA by PCR Next Gen NOT DETECTED NOT DETECTED Final    Comment: (NOTE) The GeneXpert MRSA Assay (FDA approved for NASAL specimens only), is one component of a comprehensive MRSA colonization surveillance program. It is not intended to diagnose MRSA infection nor to guide or monitor treatment for MRSA infections. Test performance is not FDA approved in patients less than 55 years old. Performed at [facility], [address] [place], [place] [address]   C Difficile Quick Screen w PCR reflex     Status: None   Collection Time: 02/02/22  5:58 PM   Specimen: STOOL  Result Value Ref Range Status   C Diff antigen NEGATIVE NEGATIVE Final   C Diff toxin NEGATIVE NEGATIVE Final   C Diff interpretation No C. difficile detected.  Final    Comment: Performed at [facility], [address] [place], [place] [address]  Gastrointestinal Panel by PCR , Stool     Status: None   Collection Time: 02/02/22  5:58 PM   Specimen: Stool  Result Value Ref Range Status   Campylobacter species NOT DETECTED NOT DETECTED Final   Plesimonas shigelloides NOT DETECTED NOT DETECTED Final   Salmonella species NOT DETECTED NOT DETECTED Final   Yersinia enterocolitica NOT DETECTED NOT DETECTED Final   Vibrio species NOT DETECTED NOT DETECTED Final   Vibrio cholerae NOT DETECTED NOT DETECTED Final   Enteroaggregative E coli (EAEC) NOT DETECTED NOT DETECTED Final   Enteropathogenic E coli (EPEC) NOT DETECTED NOT DETECTED Final   Enterotoxigenic E coli (ETEC) NOT DETECTED NOT DETECTED Final   Shiga like toxin producing E coli (STEC) NOT DETECTED NOT DETECTED Final   Shigella/Enteroinvasive E coli (EIEC) NOT DETECTED NOT DETECTED Final   Cryptosporidium NOT DETECTED NOT DETECTED Final   Cyclospora cayetanensis NOT DETECTED NOT DETECTED Final   Entamoeba histolytica NOT DETECTED NOT DETECTED Final   Giardia lamblia NOT DETECTED NOT DETECTED Final   Adenovirus F40/41 NOT  DETECTED NOT DETECTED Final   Astrovirus NOT DETECTED NOT DETECTED Final   Norovirus GI/GII NOT DETECTED NOT DETECTED Final   Rotavirus A NOT DETECTED NOT DETECTED Final   Sapovirus (I, II, IV, and V) NOT DETECTED NOT DETECTED Final    Comment: Performed at [facility], [address] [place], [place] [address]  Peritoneal fluid culture w Gram Stain     Status: None  Collection Time: 02/03/22  2:42 PM   Specimen: Peritoneal Washings; Peritoneal Fluid  Result Value Ref Range Status   Specimen Description   Final    PERITONEAL Performed at [facility], [address] [place], [place] [address]    Special Requests   Final    NONE Performed at [facility], [place], [place] [address]    Gram Stain NO WBC SEEN NO ORGANISMS SEEN   Final   Culture   Final    NO GROWTH 3 DAYS Performed at [facility], [place] [address], [place], [place] [address]    Report Status 02/07/2022 FINAL  Final    Coagulation Studies: No results for input(s): "LABPROT", "INR" in the last 72 hours.   Urinalysis: No results for input(s): "COLORURINE", "LABSPEC", "PHURINE", "GLUCOSEU", "HGBUR", "BILIRUBINUR", "KETONESUR", "PROTEINUR", "UROBILINOGEN", "NITRITE", "LEUKOCYTESUR" in the last 72 hours.  Invalid input(s): "APPERANCEUR"    Imaging: DG Abd 1 View  Result Date: 02/09/2022 CLINICAL DATA:  Nausea vomiting EXAM: ABDOMEN - 1 VIEW COMPARISON:  CT abdomen pelvis 02/01/2022 FINDINGS: The bowel gas pattern is normal. No radio-opaque calculi or other significant radiographic abnormality are seen. IMPRESSION: Negative. Electronically Signed   By: Franchot Gallo M.D.   On: 02/09/2022 16:20     Medications:    sodium chloride Stopped (02/02/22 0400)   sodium chloride 20 mL/hr at 02/11/22 0330   albumin human Stopped (02/08/22 2213)    sodium chloride   Intravenous Once   amiodarone  200 mg Oral Daily   Chlorhexidine Gluconate Cloth  6 each Topical  Daily   docusate sodium  100 mg Oral BID   epoetin (EPOGEN/PROCRIT) injection  8,000 Units Intravenous Q T,Th,Sa-HD   feeding supplement (PRO-STAT SUGAR FREE 64)  30 mL Oral TID WC   lidocaine  1 patch Transdermal Q24H   midodrine  10 mg Oral TID WC   multivitamin  1 tablet Oral QHS   pantoprazole  40 mg Oral BID AC   rifaximin  550 mg Oral BID   torsemide  80 mg Oral Daily   acetaminophen **OR** acetaminophen, albumin human, diclofenac Sodium, ipratropium-albuterol, nitroGLYCERIN, ondansetron **OR** ondansetron (ZOFRAN) IV, phenol  Assessment/ Plan:  [name] is a 81 y.o.  female with past medical history including liver cirrhosis, anemia, diverticulitis, hypertension, CHF, CAD, and end stage renal disease on hemodialysis. She presents to the emergency department with weakness and vomiting. She has been admitted for Lactic acidosis [E87.20] Abdominal distension [R14.0] Severe sepsis (HCC) [A41.9, R65.20] Hypotension, unspecified hypotension type [I95.9] Cirrhosis of liver with ascites, unspecified hepatic cirrhosis type ([place]) [K74.60, R18.8] Other hypervolemia [E87.79]  [facility]/TTS/right chest PermCath  End stage renal disease on hemodialysis with generalized edema.        Will maintain outpatient schedule during this admission.  Patient is to be dialyzed today/Saturday.   2. Anemia of chronic kidney disease  And GI bleed Lab Results  Component Value Date   HGB 7.6 (L) 02/11/2022  Reports recent history of dark stools.  Requiring blood transfusions this admission Patient received blood transfusion .  We will continue EPO with dialysis treatments also. Patient is being followed by primary team and GI team Patient is to have push enteroscopy today  3. Secondary Hyperparathyroidism:   Lab Results  Component Value Date   PTH 32 02/03/2022   CALCIUM 8.1 (L) 02/11/2022   CAION 1.17 03/25/2016   PHOS 3.4 02/07/2022    Calcium and phosphorus  within  acceptable range.  We will continue to monitor.  4. Hypotension due to suspected sepsis.  Patient blood pressure remains on the lower side No need for vasopressors   5. Hypokalemia Potassium remains stable  6.Hyponatremia Patient has hyponatremia secondary to ESRD and cirrhosis Renal placement therapy today should help     Addendum Patient was seen on dialysis Patient blood pressure remained low during dialysis     LOS: 10 Yacqub Baston s Citlalic Norlander 10/14/20235:58 AM

## 2022-02-11 NOTE — Progress Notes (Signed)
Pt uf off, sbp<90, md notified, pt alert, safety maintained, access visible.

## 2022-02-11 NOTE — Anesthesia Postprocedure Evaluation (Signed)
Anesthesia Post Note  Patient: April [name]  Procedure(s) Performed: ENTEROSCOPY  Patient location during evaluation: PACU Anesthesia Type: General Level of consciousness: awake and alert Pain management: pain level controlled Vital Signs Assessment: post-procedure vital signs reviewed and stable Respiratory status: spontaneous breathing, nonlabored ventilation and respiratory function stable Cardiovascular status: blood pressure returned to baseline and stable Postop Assessment: no apparent nausea or vomiting Anesthetic complications: no   No notable events documented.   Last Vitals:  Vitals:   02/11/22 1115 02/11/22 1236  BP: (!) 108/39   Pulse: 89   Resp: 18   Temp: 37.1 C 36.8 C  SpO2: 91%     Last Pain:  Vitals:   02/11/22 1236  TempSrc: Oral  PainSc:                  [name]

## 2022-02-11 NOTE — Progress Notes (Signed)
Pt completed 3.5 hour HD treatment. Did not removed uf goal d/t sbp<90 during HD. Theador [facility], MD aware. Pt alert, vss and no c/o. Report to primary RN. Start: 5859 End: 1646 451m fluid removed 75.5kg post bed weight 81.5L BVP Epogen 8000u with HD

## 2022-02-11 NOTE — Transfer of Care (Signed)
Immediate Anesthesia Transfer of Care Note  Patient: April [name]  Procedure(s) Performed: ENTEROSCOPY  Patient Location: PACU  Anesthesia Type:MAC and General  Level of Consciousness: awake  Airway & Oxygen Therapy: Patient connected to nasal cannula oxygen  Post-op Assessment: Report given to RN  Post vital signs: stable  Last Vitals:  Vitals Value Taken Time  BP    Temp 36.5 C 02/11/22 0945  Pulse    Resp    SpO2      Last Pain:  Vitals:   02/11/22 0747  TempSrc: Temporal  PainSc: 0-No pain      Patients Stated Pain Goal: 1 (61/60/73 7106)  Complications: No notable events documented.

## 2022-02-11 NOTE — Plan of Care (Signed)

## 2022-02-11 NOTE — Progress Notes (Signed)
PROGRESS NOTE    SISTER CARBONE  MWU:[id number] DOB: 07/12/1940 DOA: 02/01/2022 PCP: Einar Pheasant, MD  101A/101A-AA  LOS: 10 days   Brief hospital course:   Assessment & Plan: April [name] is an 81 year old female with history of liver cirrhosis, end-stage renal disease on hemodialysis Tuesday Thursday Saturday, hypertension, who presents emergency department for chief concerns of weakness and tremors.   CT abdomen pelvis without contrast: Was read as cirrhotic liver with moderate volume ascites.  Small left and trace right pleural effusion with associated compressive atelectasis.  Moderate large hiatal hernia containing majority of the stomach within the chest.  Aortic atherosclerosis.  Anasarca.   Patient was hypotension was placed in the ICU currently on IV levophed. Lactic acid was elevated panel on empiric IV antibiotics. No fever.   Severe anemia 2/2 upper GI bleed --with melena, presumed due to GI bleed, however, recent extensive w/u in March 2023 without obvious source of bleeding.  Recent anemia workup showed no def in vit B12, folate and iron. --s/p 3u pRBC. 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 Treated with argon plasma coagulation (APC).  Anasarca/ascites  Cirrhosis of liver (from radiologic studies) --10/6 paracentesis 2.2 liter, fluid neg for SBP--d/c'ed abxs --10/11 paracentesis with 2.4L removed. Plan: --cont HD for fluid removal --cont torsemide 80 mg daily --pt to see Dr Riki Rusk as outpt  Severe sepsis, ruled out --no source of infection found.   Acute on Chronic Diastolic CHF --2D echo on 12/08/2021 showed EF of 55-60% with grade 2 diastolic dysfunction.  Patient has 1+ leg edema, but no JVD, no pulmonary edema on chest x-ray.   --cont torsemide as 80 mg daily, per nephro --cont HD for fluid removal   ESRD on dialysis ([place]) --iHD per nephro    Hypokalemia --monitor and replete per nephro  H/o HTN, not currently active Hypotension --off levophed --cont midodrine 10 mg TID   OSA on CPAP - CPAP nightly ordered   Paroxysmal atrial fibrillation (HCC) - Patient is not on anticoagulation due to her severe anemia requiring BT's - Currently NSR --cont home amiodarone    Morbid obesity ([place]) - complicates overall care and prognosis.    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 Treated with argon plasma coagulation (APC).  Pt went to dialysis afterwards.  Complained of feeling very cold.   Objective: Vitals:   02/11/22 1652 02/11/22 1657 02/11/22 1720 02/11/22 1947  BP: (!) 101/37  (!) 95/30 (!) 93/43  Pulse: 94  100 87  Resp: '17  20 18  '$ Temp:   98.6 F (37 C) 98.9 F (37.2 C)  TempSrc:      SpO2: 93%  100% 95%  Weight:  75.5 kg    Height:        Intake/Output Summary (Last 24 hours) at 02/11/2022 2035 Last data filed at 02/11/2022 1646 Gross per 24 hour  Intake 300 ml  Output 0.4 ml  Net 299.6 ml   Filed Weights   02/11/22 0747 02/11/22 1236 02/11/22 1657  Weight: 73.1 kg  76.2 kg 75.5 kg    Examination:   Constitutional: shivering, AAOx3 HEENT: conjunctivae and lids normal, EOMI CV: No cyanosis.   RESP: normal respiratory effort, on RA Neuro: II - XII grossly intact.     Data Reviewed: I have personally reviewed labs and imaging studies  Time spent: 35 minutes  Enzo Bi, MD [facility] If 7PM-7AM, please contact night-coverage 02/11/2022, 8:35 PM

## 2022-02-11 NOTE — Progress Notes (Signed)
Post hd rn assessment 

## 2022-02-11 NOTE — TOC Progression Note (Signed)
Transition of Care [facility]) - Progression Note    Patient Details  Name: April [name] MRN: [id number] Date of Birth: March 09, 1941  Transition of Care [facility]) CM/SW [place], LCSW Phone Number: 02/11/2022, 11:56 AM  Clinical Narrative:    CSW spoke with [name] with [facility] who confirms they will follow patient at DC. Per [name], patient resides at the [facility]).   Expected Discharge Plan: [place] Barriers to Discharge: Continued Medical Work up  Expected Discharge Plan and Services Expected Discharge Plan: [place] Choice: [place] arrangements for the past 2 months: [place]: [place] ([facility])         Social Determinants of Health (SDOH) Interventions    Readmission Risk Interventions    02/10/2022   11:11 AM 11/28/2021   11:17 AM 10/19/2021   12:27 PM  Readmission Risk Prevention Plan  Transportation Screening Complete Complete Complete  PCP or Specialist Appt within 3-5 Days  Complete Complete  HRI or [place]  Complete Complete  Social Work Consult for [place] Planning/Counseling  Complete Complete  Palliative Care Screening  Not Applicable Complete  Medication Review Press photographer) Complete Complete Complete  PCP or Specialist appointment within 3-5 days of discharge Complete    HRI or [place] Complete    SW Recovery Care/Counseling Consult Complete    [place] Not Applicable

## 2022-02-11 NOTE — Progress Notes (Signed)
Pre hd rn assessment 

## 2022-02-11 NOTE — Op Note (Signed)
[facility] Gastroenterology Patient Name: April [name] Procedure Date: 02/11/2022 7:49 AM MRN: [id number] Account #: [id number] Date of Birth: [birth year] Admit Type: Inpatient Age: 81 Room: [facility] ENDO ROOM 4 Gender: Female Note Status: Finalized Instrument Name: Peds Colonoscope [id number] Procedure:             Small bowel enteroscopy Indications:           Iron deficiency anemia secondary to chronic blood                         loss, Melena Providers:             Benay Pike. Alice Reichert MD, MD Referring MD:          Einar Pheasant, MD (Referring MD) Medicines:             Propofol per Anesthesia Complications:         No immediate complications. Estimated blood loss:                         Minimal. Procedure:             Pre-Anesthesia Assessment:                        - The risks and benefits of the procedure and the                         sedation options and risks were discussed with the                         patient. All questions were answered and informed                         consent was obtained.                        - Patient identification and proposed procedure were                         verified prior to the procedure by the nurse. The                         procedure was verified in the procedure room.                        - ASA Grade Assessment: III - A patient with severe                         systemic disease.                        - After reviewing the risks and benefits, the patient                         was deemed in satisfactory condition to undergo the                         procedure.                        After obtaining informed consent,  the endoscope was                         passed under direct vision. Throughout the procedure,                         the patient's blood pressure, pulse, and oxygen                         saturations were monitored continuously. The                         Colonoscope was introduced  through the mouth and                         advanced to the mid-jejunum. The small bowel                         enteroscopy was somewhat difficult due to increased                         small bowel motility and suboptimal visualization.                         Successful completion of the procedure was aided by                         simethicone and gycopyrrholate given by anesthesia. Findings:      Multiple small angioectasias with bleeding were found in the gastric       antrum. Coagulation for hemostasis using argon plasma at 0.8       liters/minute and 60 watts was successful. Estimated blood loss was       minimal.      A few angioectasias with bleeding were found in the duodenal bulb.       Coagulation for hemostasis using argon plasma at 0.8 liters/minute and       60 watts was successful. Estimated blood loss was minimal.      A few angioectasias with stigmata of recent bleeding were found in the       proximal jejunum. Coagulation for hemostasis using argon plasma at 0.8       liters/minute and 60 watts was successful. Estimated blood loss: none.      The esophagus was normal.      A 5 cm hiatal hernia was present. 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                         Treated with argon plasma coagulation (APC).                        - A few bleeding angioectasias in the duodenum.                         Treated with argon plasma coagulation (APC).                        - A few recently bleeding angioectasias in the  jejunum. Treated with argon plasma coagulation (APC).                        - Normal esophagus.                        - 5 cm hiatal hernia.                        - No specimens collected. Recommendation:        - Return patient to hospital ward for ongoing care.                        - Clear liquid diet.                        - Advance diet as tolerated.                        - Serial  H/H, Serial exams                        - GI service will continue to follow the patient's                         progress and closely observe for any changes in                         clinical status. Procedure Code(s):     --- Professional ---                        [phone], Small intestinal endoscopy, enteroscopy beyond                         second portion of duodenum, not including ileum; with                         control of bleeding (eg, injection, bipolar cautery,                         unipolar cautery, laser, heater probe, stapler, plasma                         coagulator) Diagnosis Code(s):     --- Professional ---                        K92.1, Melena (includes Hematochezia)                        D50.0, Iron deficiency anemia secondary to blood loss                         (chronic)                        K44.9, Diaphragmatic hernia without obstruction or                         gangrene  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 All rights reserved. The codes documented in this report are preliminary and upon coder review may  be revised to meet current compliance requirements. [name] MD, MD 02/11/2022 9:58:56 AM This report has been signed electronically. Number of Addenda: 0 Note Initiated On: 02/11/2022 7:49 AM Estimated Blood Loss:  Estimated blood loss was minimal.      [facility]

## 2022-02-12 LAB — CBC
HCT: 22.4 % — ABNORMAL LOW (ref 36.0–46.0)
Hemoglobin: 7.5 g/dL — ABNORMAL LOW (ref 12.0–15.0)
MCH: 32.9 pg (ref 26.0–34.0)
MCHC: 33.5 g/dL (ref 30.0–36.0)
MCV: 98.2 fL (ref 80.0–100.0)
Platelets: 105 10*3/uL — ABNORMAL LOW (ref 150–400)
RBC: 2.28 MIL/uL — ABNORMAL LOW (ref 3.87–5.11)
RDW: 20.9 % — ABNORMAL HIGH (ref 11.5–15.5)
WBC: 30.3 10*3/uL — ABNORMAL HIGH (ref 4.0–10.5)
nRBC: 0.6 % — ABNORMAL HIGH (ref 0.0–0.2)

## 2022-02-12 LAB — PROCALCITONIN: Procalcitonin: 2.2 ng/mL

## 2022-02-12 LAB — BASIC METABOLIC PANEL
Anion gap: 7 (ref 5–15)
BUN: 25 mg/dL — ABNORMAL HIGH (ref 8–23)
CO2: 28 mmol/L (ref 22–32)
Calcium: 7.8 mg/dL — ABNORMAL LOW (ref 8.9–10.3)
Chloride: 98 mmol/L (ref 98–111)
Creatinine, Ser: 2.93 mg/dL — ABNORMAL HIGH (ref 0.44–1.00)
GFR, Estimated: 15 mL/min — ABNORMAL LOW (ref >60)
Glucose, Bld: 139 mg/dL — ABNORMAL HIGH (ref 70–99)
Potassium: 4 mmol/L (ref 3.5–5.1)
Sodium: 133 mmol/L — ABNORMAL LOW (ref 135–145)

## 2022-02-12 LAB — MAGNESIUM: Magnesium: 1.6 mg/dL — ABNORMAL LOW (ref 1.7–2.4)

## 2022-02-12 MED ORDER — PANTOPRAZOLE SODIUM 40 MG PO TBEC: 40.0000 mg | DELAYED_RELEASE_TABLET | Freq: Two times a day (BID) | ORAL | 2 refills | Status: AC

## 2022-02-12 MED ORDER — MAGNESIUM SULFATE 2 GM/50ML IV SOLN
2.0000 g | Freq: Once | INTRAVENOUS | Status: AC
  Administered 2022-02-12: 2 g via INTRAVENOUS
  Filled 2022-02-12: qty 50

## 2022-02-12 MED ORDER — TORSEMIDE 40 MG PO TABS: 80.0000 mg | ORAL_TABLET | Freq: Every day | ORAL | 2 refills | Status: AC

## 2022-02-12 MED ORDER — LIDOCAINE 5 % EX PTCH: 1.0000 | MEDICATED_PATCH | CUTANEOUS | Status: DC

## 2022-02-12 MED ORDER — MIDODRINE HCL 10 MG PO TABS: 10.0000 mg | ORAL_TABLET | Freq: Three times a day (TID) | ORAL | 2 refills | Status: AC

## 2022-02-12 MED ORDER — RIFAXIMIN 550 MG PO TABS: 550.0000 mg | ORAL_TABLET | Freq: Two times a day (BID) | ORAL | 2 refills | Status: AC

## 2022-02-12 MED ORDER — EPOETIN ALFA 10000 UNIT/ML IJ SOLN: 8000.0000 [IU] | INTRAMUSCULAR | Status: AC

## 2022-02-12 NOTE — Progress Notes (Signed)
[facility]  ROUNDING NOTE   Subjective:   April [name] is a 81 year old female with past medical history including liver cirrhosis, anemia, diverticulitis, hypertension, CHF, CAD, and end stage renal disease on hemodialysis. She presents to the emergency department with weakness and vomiting. She has been admitted for Lactic acidosis [E87.20] Abdominal distension [R14.0] Severe sepsis (HCC) [A41.9, R65.20] Hypotension, unspecified hypotension type [I95.9] Cirrhosis of liver with ascites, unspecified hepatic cirrhosis type ([place]) [K74.60, R18.8] Other hypervolemia [E87.79]  This patient is known to our clinic and received outpatient dialysis treatments at [facility] on a TTS schedule,    Patient was seen today on first floor. Patient offers no new specific physical complaints Patient main complaint in today visit was she was wondering if she would be discharged home today   Objective:  Vital signs in last 24 hours:  Temp:  [97.7 F (36.5 C)-98.9 F (37.2 C)] 98.6 F (37 C) (10/15 0416) Pulse Rate:  [70-111] 70 (10/15 0416) Resp:  [16-25] 16 (10/15 0416) BP: (88-148)/(27-123) 93/37 (10/15 0416) SpO2:  [91 %-100 %] 91 % (10/15 0416) Weight:  [75.5 kg-76.2 kg] 75.5 kg (10/14 1657)  Weight change:  Filed Weights   02/11/22 0747 02/11/22 1236 02/11/22 1657  Weight: 73.1 kg 76.2 kg 75.5 kg    Intake/Output: I/O last 3 completed shifts: In: 300 [I.V.:300] Out: 0.4 [Other:0.4]   Intake/Output this shift:  No intake/output data recorded.  Physical Exam: General: NAD  Head: Normocephalic, atraumatic. Moist oral mucosal membranes  Eyes: Anicteric  Lungs:  Clear to auscultation, normal effort, room air  Heart: Regular rate and rhythm  Abdomen:  Soft, nontender  Extremities:  1+  peripheral edema  Neurologic: Nonfocal, moving all four extremities  Skin: No lesions  Access: Rt chest permcath    Basic Metabolic Panel: Recent Labs  Lab 02/06/22 0415  02/07/22 0757 02/08/22 1049 02/09/22 0417 02/10/22 0709 02/11/22 0419 02/12/22 0604  NA 132* 130* 133* 134* 135 132* 133*  K 3.6 3.3* 3.3* 3.5 3.4* 3.5 4.0  CL 97* 96* 97* 99 100 99 98  CO2 '28 26 26 26 28 26 28  '$ GLUCOSE 123* 112* 153* 92 112* 92 139*  BUN 41* 57* 42* 53* 34* 47* 25*  CREATININE 3.75* 4.55* 3.66* 4.26* 3.25* 4.15* 2.93*  CALCIUM 8.1* 7.9* 8.1* 8.3* 8.2* 8.1* 7.8*  MG  --   --  1.7 1.9 1.7 1.8 1.6*  PHOS 3.0 3.4  --   --   --   --   --     Liver Function Tests: Recent Labs  Lab 02/06/22 0415 02/07/22 0757  ALBUMIN 1.9* 2.3*   No results for input(s): "LIPASE", "AMYLASE" in the last 168 hours. No results for input(s): "AMMONIA" in the last 168 hours.   CBC: Recent Labs  Lab 02/08/22 1049 02/09/22 0417 02/09/22 1908 02/10/22 0709 02/11/22 0419 02/12/22 0604  WBC 12.2* 11.6*  --  13.5* 12.5* 30.3*  HGB 8.0* 6.6* 8.4* 8.0* 7.6* 7.5*  HCT 23.9* 19.9* 25.1* 23.7* 22.9* 22.4*  MCV 91.2 92.1  --  94.4 95.0 98.2  PLT 107* 112*  --  104* 104* 105*    Cardiac Enzymes: No results for input(s): "CKTOTAL", "CKMB", "CKMBINDEX", "TROPONINI" in the last 168 hours.  BNP: Invalid input(s): "POCBNP"  CBG: No results for input(s): "GLUCAP" in the last 168 hours.  Microbiology: Results for orders placed or performed during the hospital encounter of 02/01/22  Culture, blood (Routine x 2)  Status: None   Collection Time: 02/01/22 11:53 AM   Specimen: BLOOD  Result Value Ref Range Status   Specimen Description BLOOD LEFT ANTECUBITAL  Final   Special Requests   Final    BOTTLES DRAWN AEROBIC AND ANAEROBIC Blood Culture results may not be optimal due to an excessive volume of blood received in culture bottles   Culture   Final    NO GROWTH 5 DAYS Performed at [facility], [address], [place], [place] [address]    Report Status 02/06/2022 FINAL  Final  Culture, blood (Routine x 2)     Status: None   Collection Time: 02/01/22  1:16 PM    Specimen: BLOOD  Result Value Ref Range Status   Specimen Description BLOOD BLOOD LEFT FOREARM  Final   Special Requests BLOOD LEFT ARM Blood Culture adequate volume  Final   Culture   Final    NO GROWTH 5 DAYS Performed at [facility], [address] [place], [place] [address]    Report Status 02/06/2022 FINAL  Final  Resp Panel by RT-PCR (Flu A&B, Covid) Anterior Nasal Swab     Status: None   Collection Time: 02/01/22  1:17 PM   Specimen: Anterior Nasal Swab  Result Value Ref Range Status   SARS Coronavirus 2 by RT PCR NEGATIVE NEGATIVE Final    Comment: (NOTE) SARS-CoV-2 target nucleic acids are NOT DETECTED.  The SARS-CoV-2 RNA is generally detectable in upper respiratory specimens during the acute phase of infection. The lowest concentration of SARS-CoV-2 viral copies this assay can detect is 138 copies/mL. A negative result does not preclude SARS-Cov-2 infection and should not be used as the sole basis for treatment or other patient management decisions. A negative result may occur with  improper specimen collection/handling, submission of specimen other than nasopharyngeal swab, presence of viral mutation(s) within the areas targeted by this assay, and inadequate number of viral copies(<138 copies/mL). A negative result must be combined with clinical observations, patient history, and epidemiological information. The expected result is Negative.  Fact Sheet for Patients:  EntrepreneurPulse.com.au  Fact Sheet for Healthcare Providers:  IncredibleEmployment.be  This test is no t yet approved or cleared by the [place] FDA and  has been authorized for detection and/or diagnosis of SARS-CoV-2 by FDA under an Emergency Use Authorization (EUA). This EUA will remain  in effect (meaning this test can be used) for the duration of the COVID-19 declaration under Section 564(b)(1) of the Act, 21 U.S.C.section 360bbb-3(b)(1),  unless the authorization is terminated  or revoked sooner.       Influenza A by PCR NEGATIVE NEGATIVE Final   Influenza B by PCR NEGATIVE NEGATIVE Final    Comment: (NOTE) The Xpert Xpress SARS-CoV-2/FLU/RSV plus assay is intended as an aid in the diagnosis of influenza from Nasopharyngeal swab specimens and should not be used as a sole basis for treatment. Nasal washings and aspirates are unacceptable for Xpert Xpress SARS-CoV-2/FLU/RSV testing.  Fact Sheet for Patients: EntrepreneurPulse.com.au  Fact Sheet for Healthcare Providers: IncredibleEmployment.be  This test is not yet approved or cleared by the [place] FDA and has been authorized for detection and/or diagnosis of SARS-CoV-2 by FDA under an Emergency Use Authorization (EUA). This EUA will remain in effect (meaning this test can be used) for the duration of the COVID-19 declaration under Section 564(b)(1) of the Act, 21 U.S.C. section 360bbb-3(b)(1), unless the authorization is terminated or revoked.  Performed at [facility], [address],  [place], [place] [address]   Peritoneal fluid culture w Gram Stain     Status: None   Collection Time: 02/01/22  4:59 PM   Specimen: Peritoneal Washings; Peritoneal Fluid  Result Value Ref Range Status   Specimen Description   Final    PERITONEAL Performed at [facility], [address], [place], [place] [address]    Special Requests   Final    NONE Performed at [facility], [place], [place] [address]    Gram Stain NO ORGANISMS SEEN NO WBC SEEN   Final   Culture   Final    NO GROWTH 3 DAYS Performed at [facility], [place] [address], [place], [place] [address]    Report Status 02/05/2022 FINAL  Final  MRSA Next Gen by PCR, Nasal     Status: None   Collection Time: 02/01/22 10:53 PM   Specimen: Nasal Mucosa; Nasal Swab  Result Value Ref Range Status   MRSA by PCR Next Gen  NOT DETECTED NOT DETECTED Final    Comment: (NOTE) The GeneXpert MRSA Assay (FDA approved for NASAL specimens only), is one component of a comprehensive MRSA colonization surveillance program. It is not intended to diagnose MRSA infection nor to guide or monitor treatment for MRSA infections. Test performance is not FDA approved in patients less than 90 years old. Performed at [facility], [address] [place], [place] [address]   C Difficile Quick Screen w PCR reflex     Status: None   Collection Time: 02/02/22  5:58 PM   Specimen: STOOL  Result Value Ref Range Status   C Diff antigen NEGATIVE NEGATIVE Final   C Diff toxin NEGATIVE NEGATIVE Final   C Diff interpretation No C. difficile detected.  Final    Comment: Performed at [facility], [address] [place], [place] [address]  Gastrointestinal Panel by PCR , Stool     Status: None   Collection Time: 02/02/22  5:58 PM   Specimen: Stool  Result Value Ref Range Status   Campylobacter species NOT DETECTED NOT DETECTED Final   Plesimonas shigelloides NOT DETECTED NOT DETECTED Final   Salmonella species NOT DETECTED NOT DETECTED Final   Yersinia enterocolitica NOT DETECTED NOT DETECTED Final   Vibrio species NOT DETECTED NOT DETECTED Final   Vibrio cholerae NOT DETECTED NOT DETECTED Final   Enteroaggregative E coli (EAEC) NOT DETECTED NOT DETECTED Final   Enteropathogenic E coli (EPEC) NOT DETECTED NOT DETECTED Final   Enterotoxigenic E coli (ETEC) NOT DETECTED NOT DETECTED Final   Shiga like toxin producing E coli (STEC) NOT DETECTED NOT DETECTED Final   Shigella/Enteroinvasive E coli (EIEC) NOT DETECTED NOT DETECTED Final   Cryptosporidium NOT DETECTED NOT DETECTED Final   Cyclospora cayetanensis NOT DETECTED NOT DETECTED Final   Entamoeba histolytica NOT DETECTED NOT DETECTED Final   Giardia lamblia NOT DETECTED NOT DETECTED Final   Adenovirus F40/41 NOT DETECTED NOT DETECTED Final   Astrovirus NOT  DETECTED NOT DETECTED Final   Norovirus GI/GII NOT DETECTED NOT DETECTED Final   Rotavirus A NOT DETECTED NOT DETECTED Final   Sapovirus (I, II, IV, and V) NOT DETECTED NOT DETECTED Final    Comment: Performed at [facility], [address], [place], [place] [address]  Peritoneal fluid culture w Gram Stain     Status: None   Collection Time: 02/03/22  2:42 PM   Specimen: Peritoneal Washings; Peritoneal Fluid  Result Value Ref Range Status  Specimen Description   Final    PERITONEAL Performed at [facility], [address], [place], [place] [address]    Special Requests   Final    NONE Performed at [facility], [place], [place] [address]    Gram Stain NO WBC SEEN NO ORGANISMS SEEN   Final   Culture   Final    NO GROWTH 3 DAYS Performed at [facility], [place] [address] [place], [place] [address]    Report Status 02/07/2022 FINAL  Final    Coagulation Studies: No results for input(s): "LABPROT", "INR" in the last 72 hours.   Urinalysis: No results for input(s): "COLORURINE", "LABSPEC", "PHURINE", "GLUCOSEU", "HGBUR", "BILIRUBINUR", "KETONESUR", "PROTEINUR", "UROBILINOGEN", "NITRITE", "LEUKOCYTESUR" in the last 72 hours.  Invalid input(s): "APPERANCEUR"    Imaging: No results found.   Medications:    sodium chloride Stopped (02/02/22 0400)   albumin human Stopped (02/08/22 2213)   magnesium sulfate bolus IVPB      sodium chloride   Intravenous Once   amiodarone  200 mg Oral Daily   Chlorhexidine Gluconate Cloth  6 each Topical Daily   docusate sodium  100 mg Oral BID   epoetin (EPOGEN/PROCRIT) injection  8,000 Units Intravenous Q T,Th,Sa-HD   feeding supplement (PRO-STAT SUGAR FREE 64)  30 mL Oral TID WC   lidocaine  1 patch Transdermal Q24H   midodrine  10 mg Oral TID WC   multivitamin  1 tablet Oral QHS   pantoprazole  40 mg Oral BID AC   rifaximin  550 mg Oral BID   torsemide  80 mg Oral Daily    acetaminophen **OR** acetaminophen, albumin human, diclofenac Sodium, ipratropium-albuterol, nitroGLYCERIN, ondansetron **OR** ondansetron (ZOFRAN) IV, phenol  Assessment/ Plan:  [name] is a 81 y.o.  female with past medical history including liver cirrhosis, anemia, diverticulitis, hypertension, CHF, CAD, and end stage renal disease on hemodialysis. She presents to the emergency department with weakness and vomiting. She has been admitted for Lactic acidosis [E87.20] Abdominal distension [R14.0] Severe sepsis (HCC) [A41.9, R65.20] Hypotension, unspecified hypotension type [I95.9] Cirrhosis of liver with ascites, unspecified hepatic cirrhosis type ([place]) [K74.60, R18.8] Other hypervolemia [E87.79]  [facility]/TTS/right chest PermCath  End stage renal disease on hemodialysis with generalized edema.        Will maintain outpatient schedule during this admission.  Patient was dialyzed Saturday.       No need for renal placement therapy today. I discussed the patient to please do go for her outpatient dialysis appointments if she is discharged home today   2. Anemia of chronic kidney disease  And Upper GI bleed Lab Results  Component Value Date   HGB 7.5 (L) 02/12/2022  Reports recent history of dark stools.  Requiring blood transfusions this admission Patient received blood transfusion .  We will continue EPO with dialysis treatments also. Patient had push enteroscopy yesterday. And multiple bleeding angiectasia's in the stomach and duodenum were identified.  GI did perform argon plasma coagulation. Patient is being followed by primary team and GI team   3. Secondary Hyperparathyroidism:   Lab Results  Component Value Date   PTH 32 02/03/2022   CALCIUM 7.8 (L) 02/12/2022   CAION 1.17 03/25/2016   PHOS 3.4 02/07/2022    Calcium and phosphorus within acceptable range.  We will continue to monitor.  4. Hypotension due to suspected sepsis.  Patient blood  pressure remains on the lower side No need for vasopressors  5. Hypokalemia Potassium remains stable  6.Hyponatremia Patient has hyponatremia secondary to ESRD and cirrhosis Renal placement therapy today should help       LOS: 11 Jyll Tomaro s Leatta Alewine 10/15/20238:19 AM

## 2022-02-12 NOTE — Progress Notes (Signed)
Magnesium infusing into proximal LUE IV with complaints of increased pain.  Proximal IV removed due to discharge orders and increased pain.  Magnesium continued infusing into distal LUE IV site via IVPB.

## 2022-02-12 NOTE — Progress Notes (Signed)
GI Inpatient Follow-up Note  Subjective:  Patient seen in follow-up for decompensated liver cirrhosis and recurrent anemia with melena. No acute events overnight. She is s/p push enteroscopy yesterday with Dr. Alice Reichert which showed multiple bleeding AVMs in stomach, duodenum, and jejunum which were treated with APC. Hemoglobin 7.5 this morning. No recurrent bleeding overnight or this morning. She feels better today with more energy. She reports she is ready to go home.    Scheduled Inpatient Medications:   sodium chloride   Intravenous Once   amiodarone  200 mg Oral Daily   Chlorhexidine Gluconate Cloth  6 each Topical Daily   docusate sodium  100 mg Oral BID   epoetin (EPOGEN/PROCRIT) injection  8,000 Units Intravenous Q T,Th,Sa-HD   feeding supplement (PRO-STAT SUGAR FREE 64)  30 mL Oral TID WC   lidocaine  1 patch Transdermal Q24H   midodrine  10 mg Oral TID WC   multivitamin  1 tablet Oral QHS   pantoprazole  40 mg Oral BID AC   rifaximin  550 mg Oral BID   torsemide  80 mg Oral Daily    Continuous Inpatient Infusions:    sodium chloride Stopped (02/02/22 0400)   albumin human Stopped (02/08/22 2213)   magnesium sulfate bolus IVPB 2 g (02/12/22 0958)    PRN Inpatient Medications:  acetaminophen **OR** acetaminophen, albumin human, diclofenac Sodium, ipratropium-albuterol, nitroGLYCERIN, ondansetron **OR** ondansetron (ZOFRAN) IV, phenol  Review of Systems: Constitutional: Weight is stable.  Eyes: No changes in vision. ENT: No oral lesions, sore throat.  GI: see HPI.  Heme/Lymph: No easy bruising.  CV: No chest pain.  GU: No hematuria.  Integumentary: No rashes.  Neuro: No headaches.  Psych: No depression/anxiety.  Endocrine: No heat/cold intolerance.  Allergic/Immunologic: No urticaria.  Resp: No cough, SOB.  Musculoskeletal: No joint swelling.    Physical Examination: BP (!) 94/42 (BP Location: Right Arm)   Pulse 69   Temp 98.3 F (36.8 C)   Resp 20   Ht  5' (1.524 m)   Wt 75.5 kg   LMP 05/01/1972   SpO2 95%   BMI 32.51 kg/m  Gen: NAD, alert and oriented x 4, chronically ill-appearing  HEENT: PEERLA, EOMI, Neck: supple, no JVD or thyromegaly Chest: CTA bilaterally, no wheezes, crackles, or other adventitious sounds CV: RRR, no m/g/c/r Abd: soft, NT, ND, +BS in all four quadrants; no HSM, guarding, ridigity, or rebound tenderness Ext: no edema, well perfused with 2+ pulses, Skin: no rash or lesions noted Lymph: no LAD  Data: Lab Results  Component Value Date   WBC 30.3 (H) 02/12/2022   HGB 7.5 (L) 02/12/2022   HCT 22.4 (L) 02/12/2022   MCV 98.2 02/12/2022   PLT 105 (L) 02/12/2022   Recent Labs  Lab 02/10/22 0709 02/11/22 0419 02/12/22 0604  HGB 8.0* 7.6* 7.5*   Lab Results  Component Value Date   NA 133 (L) 02/12/2022   K 4.0 02/12/2022   CL 98 02/12/2022   CO2 28 02/12/2022   BUN 25 (H) 02/12/2022   CREATININE 2.93 (H) 02/12/2022   GLU 96 02/08/2012   Lab Results  Component Value Date   ALT 36 02/04/2022   AST 59 (H) 02/04/2022   ALKPHOS 85 02/04/2022   BILITOT 1.4 (H) 02/04/2022   No results for input(s): "APTT", "INR", "PTT" in the last 168 hours.  EGD 02/11/2022: Findings:      Multiple small angioectasias with bleeding were found in the gastric  antrum. Coagulation for hemostasis using argon plasma at 0.8       liters/minute and 60 watts was successful. Estimated blood loss was       minimal.      A few angioectasias with bleeding were found in the duodenal bulb.       Coagulation for hemostasis using argon plasma at 0.8 liters/minute and       60 watts was successful. Estimated blood loss was minimal.      A few angioectasias with stigmata of recent bleeding were found in the       proximal jejunum. Coagulation for hemostasis using argon plasma at 0.8       liters/minute and 60 watts was successful. Estimated blood loss: none.      The esophagus was normal.      A 5 cm hiatal hernia was  present.  Assessment/Plan:  81 y/o Caucasian female with a PMH of ESRD on HD, HTN, HLD, obesity, PAF, OSA on CPAP, decompensated liver cirrhosis, CAD s/p PCI, and acute on chronic diastolic CHF admitted with shock. GI consulted for recurrence of anemia and melena requiring multiple blood transfusions.   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 Transfuse for Hgb <7.0.  - Continue Protonix for gastric protection - Advance diet as tolerated - Stable for discharge today from GI standpoint - Patient should follow-up with Dr. Riki Rusk per her request upon discharge for further cirrhosis care and Delmar surveillance - GI will sign off at this time  Please call with questions or concerns.    Octavia Bruckner, PA-C [facility] [phone]

## 2022-02-12 NOTE — Discharge Summary (Signed)
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  They were treated.  You may develop more in the future, so if you start noting black stool again, then please see your doctor.  GI doctor has increased your rifaximin and protonix.  Kidney doctor has increased your torsemide.  I have added midodrine to increase your blood pressure.  Please follow up with your PCP in a week for lab checks.   Dr. Enzo Bi - -   No wound care   Complete by: As directed       Hospital Course:  For full details, please see H&P, progress notes, consult notes and ancillary notes.  Briefly,  April WIGGLESWORTH is an 81 year old female with history of liver cirrhosis, end-stage renal disease on hemodialysis Tuesday Thursday Saturday, hypertension, who presented emergency department for chief concerns of weakness and tremors.   Patient was found hypotension and placed in the ICU on IV levophed.   Severe anemia 2/2 upper GI bleed --with melena, presumed due to GI bleed, however, recent extensive w/u in March 2023 without obvious source of bleeding.  Recent anemia workup showed no def in vit B12, folate and iron. --s/p 3u pRBC. --re-engaged GI to consider repeat endoluminal studies. 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 Treated with argon plasma coagulation (APC). --added PPI BID. --Hgb stable 7.5 prior to discharge.  Check Hgb level with PCP 1 week after discharge.   Anasarca/ascites  Cirrhosis of liver (from radiologic studies) --10/6  paracentesis 2.2 liter, fluid neg for SBP--d/c'ed abxs --cont HD for fluid removal --cont torsemide 80 mg daily (increased from 40 mg 3 times a week) --cont rifaximin 550 mg BID (increased from 200 mg BID) --pt to see Dr Riki Rusk as outpt   Severe sepsis, ruled out --no source of infection found.   Acute on Chronic Diastolic CHF --2D echo on 12/08/2021 showed EF of 55-60% with grade 2 diastolic dysfunction.  Patient has 1+ leg edema, but no JVD, no pulmonary edema on chest x-ray.   --cont torsemide as 80 mg daily, per nephro --cont HD for fluid removal   ESRD on dialysis ([place]) --iHD per nephro   Hypokalemia --monitored and repleted per nephro   H/o HTN, not currently active Hypotension --off levophed --cont midodrine 10 mg TID (new)   OSA on CPAP - CPAP nightly ordered   Paroxysmal atrial fibrillation ([place]) - Patient is not on anticoagulation due to her severe anemia requiring BT's - Currently NSR --cont home amiodarone    Morbid obesity ([place]) - complicates overall care and prognosis.    H/o recent PE --not on any anticoagulation due to anemia requiring freq BT    Discharge Diagnoses:  Principal Problem:   Severe sepsis ([place]) Active Problems:   Anemia in ESRD (end-stage renal disease) (HCC)   Symptomatic anemia   Edema and ascites    ESRD on dialysis ([place])   HTN (hypertension)   Hyperlipidemia   OSA on CPAP   Essential hypertension, benign   GERD (gastroesophageal reflux disease)   Morbid obesity ([place])  DOE (dyspnea on exertion)   Paroxysmal atrial fibrillation (HCC)   Weakness   Thrombocytopenia (HCC)   Anasarca   Prolonged QT interval   Shock ([place])   Pressure injury of skin   Melena   30 Day Unplanned Readmission Risk Score    Flowsheet Row ED to Hosp-Admission (Current) from 02/01/2022 in [place]  30 Day Unplanned Readmission Risk Score (%) 48.45 Filed at 02/12/2022 0801       This score is the  patient's risk of an unplanned readmission within 30 days of being discharged (0 -100%). The score is based on dignosis, age, lab data, medications, orders, and past utilization.   Low:  0-14.9   Medium: 15-21.9   High: 22-29.9   Extreme: 30 and above         Discharge Instructions:  Allergies as of 02/12/2022       Reactions   Penicillins Other (See Comments)   Okay to take amoxicillin/(pt does not recall what the reaction to penicillin was (58-36 years old)   Sulfa Antibiotics Rash        Medication List     TAKE these medications    acetaminophen 325 MG tablet Commonly known as: TYLENOL Take 650 mg by mouth every 4 (four) hours as needed for mild pain or fever.   albuterol 108 (90 Base) MCG/ACT inhaler Commonly known as: VENTOLIN HFA Inhale 2 puffs into the lungs every 6 (six) hours as needed for wheezing or shortness of breath.   amiodarone 200 MG tablet Commonly known as: PACERONE Take 1 tablet (200 mg total) by mouth daily.   diclofenac Sodium 1 % Gel Commonly known as: VOLTAREN Apply 2 g topically 3 (three) times daily as needed (pain in legs and elbows).   docusate sodium 100 MG capsule Commonly known as: Stool Softener Take 1 capsule (100 mg total) by mouth 2 (two) times daily.   epoetin alfa 10000 UNIT/ML injection Commonly known as: EPOGEN Inject 0.8 mLs (8,000 Units total) into the vein Every Tuesday,Thursday,and Saturday with dialysis. Start taking on: February 14, 2022   famotidine 20 MG tablet Commonly known as: PEPCID Take 20 mg by mouth 2 (two) times daily.   feeding supplement (PRO-STAT SUGAR FREE 64) Liqd Take 30 mLs by mouth 3 (three) times daily with meals.   ipratropium-albuterol 0.5-2.5 (3) MG/3ML Soln Commonly known as: DUONEB Take 3 mLs by nebulization every 6 (six) hours as needed.   lactulose 10 GM/15ML solution Commonly known as: CHRONULAC Take 30 mLs (20 g total) by mouth 2 (two) times daily.   lidocaine 5 % Commonly known  as: LIDODERM Place 1 patch onto the skin daily. Home med. What changed:  how much to take additional instructions   midodrine 10 MG tablet Commonly known as: PROAMATINE Take 1 tablet (10 mg total) by mouth 3 (three) times daily with meals.   multivitamin with minerals tablet Take 1 tablet by mouth daily.   nitroGLYCERIN 0.4 MG SL tablet Commonly known as: Nitrostat Place 1 tablet (0.4 mg total) under the tongue every 5 (five) minutes as needed for chest pain.   pantoprazole 40 MG tablet Commonly known as: PROTONIX Take 1 tablet (40 mg total) by mouth 2 (two) times daily before a meal.   rifaximin 550 MG Tabs tablet Commonly known as: XIFAXAN Take 1 tablet (550 mg total) by mouth 2 (two) times daily. What changed:  medication strength how much to take   Torsemide 40 MG Tabs  Take 80 mg by mouth daily. What changed:  how much to take when to take this               Durable Medical Equipment  (From admission, onward)           Start     Ordered   02/08/22 1531  For home use only DME Bedside commode  Once       Question:  Patient needs a bedside commode to treat with the following condition  Answer:  Weakness   02/08/22 1530             Follow-up Information     Einar Pheasant, MD Follow up in 1 week(s).   Specialty: Internal Medicine Contact information: [address] [place] [address] [phone]                 Allergies  Allergen Reactions   Penicillins Other (See Comments)    Okay to take amoxicillin/(pt does not recall what the reaction to penicillin was (49-39 years old)    Sulfa Antibiotics Rash     The results of significant diagnostics from this hospitalization (including imaging, microbiology, ancillary and laboratory) are listed below for reference.   Consultations:   Procedures/Studies: DG Abd 1 View  Result Date: 02/09/2022 CLINICAL DATA:  Nausea vomiting EXAM: ABDOMEN - 1 VIEW  COMPARISON:  CT abdomen pelvis 02/01/2022 FINDINGS: The bowel gas pattern is normal. No radio-opaque calculi or other significant radiographic abnormality are seen. IMPRESSION: Negative. Electronically Signed   By: Franchot Gallo M.D.   On: 02/09/2022 16:20   US Paracentesis  Result Date: 02/08/2022 INDICATION: Patient with a history of cirrhosis and recurrent ascites. Interventional radiology asked to perform a therapeutic paracentesis. EXAM: ULTRASOUND GUIDED PARACENTESIS MEDICATIONS: 1% lidocaine 15 mL COMPLICATIONS: None immediate. PROCEDURE: Informed written consent was obtained from the patient after a discussion of the risks, benefits and alternatives to treatment. A timeout was performed prior to the initiation of the procedure. Initial ultrasound scanning demonstrates a large amount of ascites within the right lower abdominal quadrant. The right lower abdomen was prepped and draped in the usual sterile fashion. 1% lidocaine was used for local anesthesia. Following this, a 6 Fr Safe-T-Centesis catheter was introduced. An ultrasound image was saved for documentation purposes. The paracentesis was performed. The catheter was removed and a dressing was applied. The patient tolerated the procedure well without immediate post procedural complication. FINDINGS: A total of approximately 2.4 L of clear yellow fluid was removed. IMPRESSION: Successful ultrasound-guided paracentesis yielding 2.4 liters of peritoneal fluid. Read by: Soyla Dryer, NP PLAN: The patient has required >/=2 paracenteses in a 30 day period and a formal evaluation by the [facility] has been arranged. Electronically Signed   By: Aletta Edouard M.D.   On: 02/08/2022 16:21   US Paracentesis  Result Date: 02/06/2022 INDICATION: Patient with a history of cirrhosis and new onset ascites. Interventional radiology asked to perform a diagnostic and therapeutic paracentesis. EXAM:  ULTRASOUND GUIDED PARACENTESIS MEDICATIONS: 1% lidocaine 10 mL COMPLICATIONS: None immediate. PROCEDURE: Informed written consent was obtained from the patient after a discussion of the risks, benefits and alternatives to treatment. A timeout was performed prior to the initiation of the procedure. Initial ultrasound scanning demonstrates a large amount of ascites within the right lower abdominal quadrant. The right lower abdomen was prepped and draped in the usual sterile fashion. 1% lidocaine was used for local anesthesia. Following this, a  19 gauge, 7-cm, Yueh catheter was introduced. An ultrasound image was saved for documentation purposes. The paracentesis was performed. The catheter was removed and a dressing was applied. The patient tolerated the procedure well without immediate post procedural complication. FINDINGS: A total of approximately 2.2 L of clear yellow fluid was removed. Samples were sent to the laboratory as requested by the clinical team. IMPRESSION: Successful ultrasound-guided paracentesis yielding 2.2 liters of peritoneal fluid. Read by: Soyla Dryer, NP PLAN: If the patient eventually requires >/=2 paracenteses in a 30 day period, candidacy for formal evaluation by the [facility] will be assessed. Electronically Signed   By: Miachel Roux M.D.   On: 02/06/2022 08:02   CT Abdomen Pelvis Wo Contrast  Result Date: 02/01/2022 CLINICAL DATA:  Abdominal pain, acute, nonlocalized EXAM: CT ABDOMEN AND PELVIS WITHOUT CONTRAST TECHNIQUE: Multidetector CT imaging of the abdomen and pelvis was performed following the standard protocol without IV contrast. RADIATION DOSE REDUCTION: This exam was performed according to the departmental dose-optimization program which includes automated exposure control, adjustment of the mA and/or kV according to patient size and/or use of iterative reconstruction technique. COMPARISON:  11/29/2020 FINDINGS: Lower  chest: Small left and trace right pleural effusions with associated compressive atelectasis. Heart size within normal limits. Relative hypoattenuation of the cardiac blood pool indicative of anemia. Moderate-large hiatal hernia. Hepatobiliary: Shrunken, nodular appearance of the liver compatible with cirrhosis. No definite liver lesion identified on unenhanced images. Moderately distended gallbladder. No hyperdense gallstone. Pancreas: Unremarkable. No pancreatic ductal dilatation or surrounding inflammatory changes. Spleen: Normal in size without focal abnormality. Adrenals/Urinary Tract: Adrenal glands are unremarkable. Kidneys are normal, without renal calculi, solid lesion, or hydronephrosis. Bladder is unremarkable. Stomach/Bowel: Moderate-large hiatal hernia containing the majority of the stomach within the chest. Appendix not visualized, and may be surgically absent. Left-sided colonic diverticulosis. No evidence of bowel wall thickening, distention, or inflammatory changes. Vascular/Lymphatic: Aortic atherosclerosis. No enlarged abdominal or pelvic lymph nodes. Reproductive: Status post hysterectomy. No adnexal masses. Other: Moderate volume ascites. No organized abdominopelvic fluid collections. No pneumoperitoneum. Musculoskeletal: Anasarca.  No acute bony findings. IMPRESSION: 1. Cirrhotic liver with moderate volume ascites. 2. Small left and trace right pleural effusions with associated compressive atelectasis. 3. Moderate-large hiatal hernia containing the majority of the stomach within the chest. 4. Left-sided colonic diverticulosis without evidence for acute diverticulitis. 5. Anasarca. 6. Aortic atherosclerosis (ICD10-I70.0). 7. Relative hypoattenuation of the cardiac blood pool indicative of anemia. Electronically Signed   By: Davina Poke D.O.   On: 02/01/2022 13:40   DG Chest 2 View  Result Date: 02/01/2022 CLINICAL DATA:  Weakness with vomiting.  Suspected sepsis. EXAM: CHEST - 2 VIEW  COMPARISON:  Radiographs 01/11/2022 and 01/09/2022.  CT 01/11/2022. FINDINGS: Right IJ hemodialysis catheter is unchanged, projecting to the level of the upper right atrium. The heart size and mediastinal contours are stable with a large hiatal hernia. Unchanged small bilateral pleural effusions with associated left greater than right basilar atelectasis. No edema or confluent airspace opacity identified. There is no pneumothorax. The bones appear unchanged. IMPRESSION: Stable chest with small bilateral pleural effusions and bibasilar atelectasis. Large hiatal hernia. No acute findings. Electronically Signed   By: Richardean Sale M.D.   On: 02/01/2022 12:36      Labs: BNP (last 3 results) Recent Labs    10/17/21 1534 11/24/21 0505 02/02/22 0420  BNP 296.7* 368.4* 7,616.0*   Basic Metabolic Panel: Recent Labs  Lab 02/06/22 0415 02/07/22 0757 02/08/22 1049 02/09/22  2229 02/10/22 0709 02/11/22 0419 02/12/22 0604  NA 132* 130* 133* 134* 135 132* 133*  K 3.6 3.3* 3.3* 3.5 3.4* 3.5 4.0  CL 97* 96* 97* 99 100 99 98  CO2 '28 26 26 26 28 26 28  '$ GLUCOSE 123* 112* 153* 92 112* 92 139*  BUN 41* 57* 42* 53* 34* 47* 25*  CREATININE 3.75* 4.55* 3.66* 4.26* 3.25* 4.15* 2.93*  CALCIUM 8.1* 7.9* 8.1* 8.3* 8.2* 8.1* 7.8*  MG  --   --  1.7 1.9 1.7 1.8 1.6*  PHOS 3.0 3.4  --   --   --   --   --    Liver Function Tests: Recent Labs  Lab 02/06/22 0415 02/07/22 0757  ALBUMIN 1.9* 2.3*   No results for input(s): "LIPASE", "AMYLASE" in the last 168 hours. No results for input(s): "AMMONIA" in the last 168 hours. CBC: Recent Labs  Lab 02/08/22 1049 02/09/22 0417 02/09/22 1908 02/10/22 0709 02/11/22 0419 02/12/22 0604  WBC 12.2* 11.6*  --  13.5* 12.5* 30.3*  HGB 8.0* 6.6* 8.4* 8.0* 7.6* 7.5*  HCT 23.9* 19.9* 25.1* 23.7* 22.9* 22.4*  MCV 91.2 92.1  --  94.4 95.0 98.2  PLT 107* 112*  --  104* 104* 105*   Cardiac Enzymes: No results for input(s): "CKTOTAL", "CKMB", "CKMBINDEX", "TROPONINI"  in the last 168 hours. BNP: Invalid input(s): "POCBNP" CBG: No results for input(s): "GLUCAP" in the last 168 hours. D-Dimer No results for input(s): "DDIMER" in the last 72 hours. Hgb A1c No results for input(s): "HGBA1C" in the last 72 hours. Lipid Profile No results for input(s): "CHOL", "HDL", "LDLCALC", "TRIG", "CHOLHDL", "LDLDIRECT" in the last 72 hours. Thyroid function studies No results for input(s): "TSH", "T4TOTAL", "T3FREE", "THYROIDAB" in the last 72 hours.  Invalid input(s): "FREET3" Anemia work up No results for input(s): "VITAMINB12", "FOLATE", "FERRITIN", "TIBC", "IRON", "RETICCTPCT" in the last 72 hours. Urinalysis    Component Value Date/Time   COLORURINE STRAW (A) 10/17/2021 2030   APPEARANCEUR CLEAR (A) 10/17/2021 2030   LABSPEC 1.005 10/17/2021 2030   PHURINE 6.0 10/17/2021 2030   GLUCOSEU NEGATIVE 10/17/2021 2030   GLUCOSEU NEGATIVE 12/15/2016 1117   HGBUR MODERATE (A) 10/17/2021 2030   BILIRUBINUR NEGATIVE 10/17/2021 2030   KETONESUR NEGATIVE 10/17/2021 2030   PROTEINUR 30 (A) 10/17/2021 2030   UROBILINOGEN 0.2 12/15/2016 1117   NITRITE NEGATIVE 10/17/2021 2030   LEUKOCYTESUR NEGATIVE 10/17/2021 2030   Sepsis Labs Recent Labs  Lab 02/09/22 0417 02/10/22 0709 02/11/22 0419 02/12/22 0604  WBC 11.6* 13.5* 12.5* 30.3*   Microbiology Recent Results (from the past 240 hour(s))  C Difficile Quick Screen w PCR reflex     Status: None   Collection Time: 02/02/22  5:58 PM   Specimen: STOOL  Result Value Ref Range Status   C Diff antigen NEGATIVE NEGATIVE Final   C Diff toxin NEGATIVE NEGATIVE Final   C Diff interpretation No C. difficile detected.  Final    Comment: Performed at [facility], [address] [place], [place] [address]  Gastrointestinal Panel by PCR , Stool     Status: None   Collection Time: 02/02/22  5:58 PM   Specimen: Stool  Result Value Ref Range Status   Campylobacter species NOT DETECTED NOT DETECTED Final    Plesimonas shigelloides NOT DETECTED NOT DETECTED Final   Salmonella species NOT DETECTED NOT DETECTED Final   Yersinia enterocolitica NOT DETECTED NOT DETECTED Final   Vibrio species NOT DETECTED NOT DETECTED Final  Vibrio cholerae NOT DETECTED NOT DETECTED Final   Enteroaggregative E coli (EAEC) NOT DETECTED NOT DETECTED Final   Enteropathogenic E coli (EPEC) NOT DETECTED NOT DETECTED Final   Enterotoxigenic E coli (ETEC) NOT DETECTED NOT DETECTED Final   Shiga like toxin producing E coli (STEC) NOT DETECTED NOT DETECTED Final   Shigella/Enteroinvasive E coli (EIEC) NOT DETECTED NOT DETECTED Final   Cryptosporidium NOT DETECTED NOT DETECTED Final   Cyclospora cayetanensis NOT DETECTED NOT DETECTED Final   Entamoeba histolytica NOT DETECTED NOT DETECTED Final   Giardia lamblia NOT DETECTED NOT DETECTED Final   Adenovirus F40/41 NOT DETECTED NOT DETECTED Final   Astrovirus NOT DETECTED NOT DETECTED Final   Norovirus GI/GII NOT DETECTED NOT DETECTED Final   Rotavirus A NOT DETECTED NOT DETECTED Final   Sapovirus (I, II, IV, and V) NOT DETECTED NOT DETECTED Final    Comment: Performed at [facility], [address] [place], [place] [address]  Peritoneal fluid culture w Gram Stain     Status: None   Collection Time: 02/03/22  2:42 PM   Specimen: Peritoneal Washings; Peritoneal Fluid  Result Value Ref Range Status   Specimen Description   Final    PERITONEAL Performed at [facility], [address] [place], [place] [address]    Special Requests   Final    NONE Performed at [facility], [place], [place] [address]    Gram Stain NO WBC SEEN NO ORGANISMS SEEN   Final   Culture   Final    NO GROWTH 3 DAYS Performed at [facility], [name] [address], [place], [place] [address]    Report Status 02/07/2022 FINAL  Final     Total time spend on discharging this patient, including the last patient exam, discussing the hospital stay,  instructions for ongoing care as it relates to all pertinent caregivers, as well as preparing the medical discharge records, prescriptions, and/or referrals as applicable, is 40 minutes.    Enzo Bi, MD  [facility] 02/12/2022, 8:25 AM

## 2022-02-12 NOTE — Plan of Care (Signed)
Patient is stable and will be returning to [employer] at [place] via [employer].

## 2022-02-12 NOTE — TOC Transition Note (Signed)
Transition of Care [facility]) - CM/SW Discharge Note   Patient Details  Name: April [name] MRN: [id number] Date of Birth: Sep 06, 1940  Transition of Care [facility]) CM/SW Contact:  Magnus Ivan, LCSW Phone Number: 02/12/2022, 10:51 AM   Clinical Narrative:    Patient has orders to DC home today. CSW notified [name] with [name].    Final next level of care: [place] Barriers to Discharge: Barriers Resolved   Patient Goals and CMS Choice Patient states their goals for this hospitalization and ongoing recovery are:: To return home CMS Medicare.gov Compare Post Acute Care list provided to:: Patient Choice offered to / list presented to : Patient  Discharge Placement                       Discharge Plan and Services     Post Acute Care Choice: Home Health                    HH Arranged: PT, OT, RN [facility] Agency: [place] ([place]) Date [facility] Agency Contacted: 02/12/22   Representative spoke with at [place]: [place] ([place]) Interventions     Readmission Risk Interventions    02/10/2022   11:11 AM 11/28/2021   11:17 AM 10/19/2021   12:27 PM  Readmission Risk Prevention Plan  Transportation Screening Complete Complete Complete  PCP or Specialist Appt within 3-5 Days  Complete Complete  HRI or [place]  Complete Complete  Social Work Consult for [place] Planning/Counseling  Complete Complete  Palliative Care Screening  Not Applicable Complete  Medication Review Press photographer) Complete Complete Complete  PCP or Specialist appointment within 3-5 days of discharge Complete    HRI or [place] Complete    SW Recovery Care/Counseling Consult Complete    [place] Not Applicable

## 2022-02-13 ENCOUNTER — Telehealth: Payer: Self-pay

## 2022-02-13 ENCOUNTER — Encounter: Payer: Self-pay | Admitting: Internal Medicine

## 2022-02-13 ENCOUNTER — Other Ambulatory Visit: Payer: Self-pay

## 2022-02-13 ENCOUNTER — Telehealth: Payer: Self-pay | Admitting: *Deleted

## 2022-02-13 DIAGNOSIS — D631 Anemia in chronic kidney disease: Secondary | ICD-10-CM

## 2022-02-13 DIAGNOSIS — A419 Sepsis, unspecified organism: Secondary | ICD-10-CM

## 2022-02-13 DIAGNOSIS — I10 Essential (primary) hypertension: Secondary | ICD-10-CM

## 2022-02-13 NOTE — Patient Outreach (Signed)
JAELEE LAUGHTER 1940-09-13 [id number]   [place] [facility] [ACO] Patient: April [name] Hospital Liaison coverage for [facility] for review and follow up needs for readmission prevention  Extreme high risk scores  Post hospital referral request made for follow up.  Natividad Brood, RN BSN [place]  [phone] business mobile phone Toll free office [phone]  *[place]  [phone] Fax number: [phone] [email or web address]

## 2022-02-13 NOTE — Telephone Encounter (Signed)
Transition Care Management Unsuccessful Follow-up Telephone Call  Date of discharge and from where:  02/12/22 [facility]  Attempts:  1st Attempt  Reason for unsuccessful TCM follow-up call:  Unable to reach patient. Will follow as appropriate.     Keyon Liller O'Brien-Blaney LPN, [place], [place] Direct dial [phone] ??

## 2022-02-13 NOTE — Chronic Care Management (AMB) (Signed)
  Care Coordination  Outreach Note  02/13/2022 Name: April [name] MRN: [id number] DOB: 01/24/1941   Care Coordination Outreach Attempts: An unsuccessful telephone outreach was attempted today to offer the patient information about available care coordination services as a benefit of their health plan.   Received referral   Follow Up Plan:  Additional outreach attempts will be made to offer the patient care coordination information and services.   Encounter Outcome:  No Answer  Julian Hy, [place] Direct Dial: [phone]

## 2022-02-14 ENCOUNTER — Telehealth: Payer: Self-pay | Admitting: Internal Medicine

## 2022-02-14 DIAGNOSIS — I252 Old myocardial infarction: Secondary | ICD-10-CM | POA: Diagnosis not present

## 2022-02-14 DIAGNOSIS — R251 Tremor, unspecified: Secondary | ICD-10-CM | POA: Diagnosis not present

## 2022-02-14 DIAGNOSIS — N250 Renal osteodystrophy: Secondary | ICD-10-CM | POA: Diagnosis not present

## 2022-02-14 DIAGNOSIS — E1122 Type 2 diabetes mellitus with diabetic chronic kidney disease: Secondary | ICD-10-CM | POA: Diagnosis not present

## 2022-02-14 DIAGNOSIS — L989 Disorder of the skin and subcutaneous tissue, unspecified: Secondary | ICD-10-CM | POA: Diagnosis not present

## 2022-02-14 DIAGNOSIS — D649 Anemia, unspecified: Secondary | ICD-10-CM | POA: Diagnosis not present

## 2022-02-14 DIAGNOSIS — K449 Diaphragmatic hernia without obstruction or gangrene: Secondary | ICD-10-CM | POA: Diagnosis not present

## 2022-02-14 DIAGNOSIS — K219 Gastro-esophageal reflux disease without esophagitis: Secondary | ICD-10-CM | POA: Diagnosis not present

## 2022-02-14 DIAGNOSIS — N189 Chronic kidney disease, unspecified: Secondary | ICD-10-CM | POA: Diagnosis not present

## 2022-02-14 DIAGNOSIS — G4733 Obstructive sleep apnea (adult) (pediatric): Secondary | ICD-10-CM | POA: Diagnosis not present

## 2022-02-14 DIAGNOSIS — I2510 Atherosclerotic heart disease of native coronary artery without angina pectoris: Secondary | ICD-10-CM | POA: Diagnosis not present

## 2022-02-14 DIAGNOSIS — R32 Unspecified urinary incontinence: Secondary | ICD-10-CM | POA: Diagnosis not present

## 2022-02-14 DIAGNOSIS — I5033 Acute on chronic diastolic (congestive) heart failure: Secondary | ICD-10-CM | POA: Diagnosis not present

## 2022-02-14 DIAGNOSIS — N186 End stage renal disease: Secondary | ICD-10-CM | POA: Diagnosis not present

## 2022-02-14 DIAGNOSIS — N179 Acute kidney failure, unspecified: Secondary | ICD-10-CM | POA: Diagnosis not present

## 2022-02-14 DIAGNOSIS — K31819 Angiodysplasia of stomach and duodenum without bleeding: Secondary | ICD-10-CM | POA: Diagnosis not present

## 2022-02-14 DIAGNOSIS — D631 Anemia in chronic kidney disease: Secondary | ICD-10-CM | POA: Diagnosis not present

## 2022-02-14 DIAGNOSIS — I130 Hypertensive heart and chronic kidney disease with heart failure and stage 1 through stage 4 chronic kidney disease, or unspecified chronic kidney disease: Secondary | ICD-10-CM | POA: Diagnosis not present

## 2022-02-14 DIAGNOSIS — D500 Iron deficiency anemia secondary to blood loss (chronic): Secondary | ICD-10-CM | POA: Diagnosis not present

## 2022-02-14 DIAGNOSIS — E7800 Pure hypercholesterolemia, unspecified: Secondary | ICD-10-CM | POA: Diagnosis not present

## 2022-02-14 DIAGNOSIS — D509 Iron deficiency anemia, unspecified: Secondary | ICD-10-CM | POA: Diagnosis not present

## 2022-02-14 DIAGNOSIS — Z4800 Encounter for change or removal of nonsurgical wound dressing: Secondary | ICD-10-CM | POA: Diagnosis not present

## 2022-02-14 DIAGNOSIS — M810 Age-related osteoporosis without current pathological fracture: Secondary | ICD-10-CM | POA: Diagnosis not present

## 2022-02-14 DIAGNOSIS — K5730 Diverticulosis of large intestine without perforation or abscess without bleeding: Secondary | ICD-10-CM | POA: Diagnosis not present

## 2022-02-14 DIAGNOSIS — I2699 Other pulmonary embolism without acute cor pulmonale: Secondary | ICD-10-CM | POA: Diagnosis not present

## 2022-02-14 DIAGNOSIS — Z9181 History of falling: Secondary | ICD-10-CM | POA: Diagnosis not present

## 2022-02-14 DIAGNOSIS — K7460 Unspecified cirrhosis of liver: Secondary | ICD-10-CM | POA: Diagnosis not present

## 2022-02-14 DIAGNOSIS — I447 Left bundle-branch block, unspecified: Secondary | ICD-10-CM | POA: Diagnosis not present

## 2022-02-14 DIAGNOSIS — I480 Paroxysmal atrial fibrillation: Secondary | ICD-10-CM | POA: Diagnosis not present

## 2022-02-14 NOTE — Telephone Encounter (Signed)
[name] from [place] wants to go over the patient's plan of care. She stated she has concerns about the medication Rifaximin .

## 2022-02-14 NOTE — Telephone Encounter (Addendum)
Transition Care Management Follow-up Telephone Call Date of discharge and from where: 02/12/22 [facility] How have you been since you were released from the hospital? Information also received from daughter, HIPAA compliant. Patient has decrease in mobility but PT in progress now. 24/7 care with aides. Ambulates with wheelchair with hopes of getting back to the walker. Some weakness. Appetite slightly decreased but okay. Input/output appropriate. Denies pain, n/v/d, fever, bleeding, dizziness, worsening swelling of her lower extremities and all other harmful symptoms.  Any questions or concerns? No  Items Reviewed: Did the pt receive and understand the discharge instructions provided? Yes  Medications obtained and verified? Yes  Any new allergies since your discharge? No  Dietary orders reviewed? Yes Do you have support at home? Yes   Home Care and Equipment/Supplies: Were home health services ordered? Yes HHPT in progress. 24/7 aide care in progress.   Functional Questionnaire: (I = Independent and D = Dependent) ADLs: Aides and family assist   Eating- I  Maintaining continence- Depend brief in use  Transferring/Ambulation- Wheelchair  Managing Meds- I  Follow up appointments reviewed:  PCP Hospital f/u appt confirmed? Yes  Scheduled to see PCP on 02/20/22 @ 11:00. [facility] f/u appt confirmed? Yes  Gertie Fey, Nephrology, Cardiology confirmed. Are transportation arrangements needed? No  If their condition worsens, is the pt aware to call PCP or go to the Emergency Dept.? Yes Was the patient provided with contact information for the PCP's office or ED? Yes Was to pt encouraged to call back with questions or concerns? Yes   Les Longmore Motley LPN, [place], [place] Direct dial ??[phone]

## 2022-02-15 NOTE — Telephone Encounter (Signed)
Late entry:  spoke to [place].  Discussed medication list during hospital.  Informed [facility].  The son has discharge papers and will confirm medication listed to take.  Will fax copy to [place] if able.

## 2022-02-16 DIAGNOSIS — N189 Chronic kidney disease, unspecified: Secondary | ICD-10-CM | POA: Diagnosis not present

## 2022-02-16 DIAGNOSIS — N250 Renal osteodystrophy: Secondary | ICD-10-CM | POA: Diagnosis not present

## 2022-02-16 DIAGNOSIS — D509 Iron deficiency anemia, unspecified: Secondary | ICD-10-CM | POA: Diagnosis not present

## 2022-02-16 DIAGNOSIS — D649 Anemia, unspecified: Secondary | ICD-10-CM | POA: Diagnosis not present

## 2022-02-16 DIAGNOSIS — N179 Acute kidney failure, unspecified: Secondary | ICD-10-CM | POA: Diagnosis not present

## 2022-02-17 DIAGNOSIS — D500 Iron deficiency anemia secondary to blood loss (chronic): Secondary | ICD-10-CM | POA: Diagnosis not present

## 2022-02-17 DIAGNOSIS — I5033 Acute on chronic diastolic (congestive) heart failure: Secondary | ICD-10-CM | POA: Diagnosis not present

## 2022-02-17 DIAGNOSIS — K7460 Unspecified cirrhosis of liver: Secondary | ICD-10-CM | POA: Diagnosis not present

## 2022-02-17 DIAGNOSIS — I130 Hypertensive heart and chronic kidney disease with heart failure and stage 1 through stage 4 chronic kidney disease, or unspecified chronic kidney disease: Secondary | ICD-10-CM | POA: Diagnosis not present

## 2022-02-17 DIAGNOSIS — K31819 Angiodysplasia of stomach and duodenum without bleeding: Secondary | ICD-10-CM | POA: Diagnosis not present

## 2022-02-17 DIAGNOSIS — I2699 Other pulmonary embolism without acute cor pulmonale: Secondary | ICD-10-CM | POA: Diagnosis not present

## 2022-02-18 DIAGNOSIS — N179 Acute kidney failure, unspecified: Secondary | ICD-10-CM | POA: Diagnosis not present

## 2022-02-18 DIAGNOSIS — N189 Chronic kidney disease, unspecified: Secondary | ICD-10-CM | POA: Diagnosis not present

## 2022-02-18 DIAGNOSIS — D649 Anemia, unspecified: Secondary | ICD-10-CM | POA: Diagnosis not present

## 2022-02-18 DIAGNOSIS — D509 Iron deficiency anemia, unspecified: Secondary | ICD-10-CM | POA: Diagnosis not present

## 2022-02-18 DIAGNOSIS — N250 Renal osteodystrophy: Secondary | ICD-10-CM | POA: Diagnosis not present

## 2022-02-20 ENCOUNTER — Ambulatory Visit (INDEPENDENT_AMBULATORY_CARE_PROVIDER_SITE_OTHER): Payer: Medicare Other | Admitting: Internal Medicine

## 2022-02-20 VITALS — BP 120/60 | HR 95 | Temp 98.0°F | Resp 14 | Ht 60.0 in | Wt 176.2 lb

## 2022-02-20 DIAGNOSIS — R609 Edema, unspecified: Secondary | ICD-10-CM

## 2022-02-20 DIAGNOSIS — G4733 Obstructive sleep apnea (adult) (pediatric): Secondary | ICD-10-CM | POA: Diagnosis not present

## 2022-02-20 DIAGNOSIS — N184 Chronic kidney disease, stage 4 (severe): Secondary | ICD-10-CM

## 2022-02-20 DIAGNOSIS — I480 Paroxysmal atrial fibrillation: Secondary | ICD-10-CM

## 2022-02-20 DIAGNOSIS — I700 Atherosclerosis of aorta: Secondary | ICD-10-CM

## 2022-02-20 DIAGNOSIS — R600 Localized edema: Secondary | ICD-10-CM

## 2022-02-20 DIAGNOSIS — E1122 Type 2 diabetes mellitus with diabetic chronic kidney disease: Secondary | ICD-10-CM | POA: Diagnosis not present

## 2022-02-20 DIAGNOSIS — I10 Essential (primary) hypertension: Secondary | ICD-10-CM

## 2022-02-20 DIAGNOSIS — K7460 Unspecified cirrhosis of liver: Secondary | ICD-10-CM

## 2022-02-20 DIAGNOSIS — I5032 Chronic diastolic (congestive) heart failure: Secondary | ICD-10-CM | POA: Diagnosis not present

## 2022-02-20 DIAGNOSIS — K219 Gastro-esophageal reflux disease without esophagitis: Secondary | ICD-10-CM | POA: Diagnosis not present

## 2022-02-20 DIAGNOSIS — I25110 Atherosclerotic heart disease of native coronary artery with unstable angina pectoris: Secondary | ICD-10-CM

## 2022-02-20 DIAGNOSIS — Z992 Dependence on renal dialysis: Secondary | ICD-10-CM

## 2022-02-20 DIAGNOSIS — D631 Anemia in chronic kidney disease: Secondary | ICD-10-CM | POA: Diagnosis not present

## 2022-02-20 DIAGNOSIS — D696 Thrombocytopenia, unspecified: Secondary | ICD-10-CM

## 2022-02-20 DIAGNOSIS — R188 Other ascites: Secondary | ICD-10-CM

## 2022-02-20 DIAGNOSIS — N186 End stage renal disease: Secondary | ICD-10-CM

## 2022-02-20 DIAGNOSIS — J449 Chronic obstructive pulmonary disease, unspecified: Secondary | ICD-10-CM

## 2022-02-20 DIAGNOSIS — K922 Gastrointestinal hemorrhage, unspecified: Secondary | ICD-10-CM | POA: Diagnosis not present

## 2022-02-20 LAB — CBC WITH DIFFERENTIAL/PLATELET
Basophils Absolute: 0.1 10*3/uL (ref 0.0–0.1)
Basophils Relative: 0.7 % (ref 0.0–3.0)
Eosinophils Absolute: 1.1 10*3/uL — ABNORMAL HIGH (ref 0.0–0.7)
Eosinophils Relative: 6.7 % — ABNORMAL HIGH (ref 0.0–5.0)
HCT: 27.3 % — ABNORMAL LOW (ref 36.0–46.0)
Hemoglobin: 8.7 g/dL — ABNORMAL LOW (ref 12.0–15.0)
Lymphocytes Relative: 17.3 % (ref 12.0–46.0)
Lymphs Abs: 2.8 10*3/uL (ref 0.7–4.0)
MCHC: 31.9 g/dL (ref 30.0–36.0)
MCV: 101.4 fl — ABNORMAL HIGH (ref 78.0–100.0)
Monocytes Absolute: 1.8 10*3/uL — ABNORMAL HIGH (ref 0.1–1.0)
Monocytes Relative: 11.3 % (ref 3.0–12.0)
Neutro Abs: 10.2 10*3/uL — ABNORMAL HIGH (ref 1.4–7.7)
Neutrophils Relative %: 64 % (ref 43.0–77.0)
Platelets: 124 10*3/uL — ABNORMAL LOW (ref 150.0–400.0)
RBC: 2.69 Mil/uL — ABNORMAL LOW (ref 3.87–5.11)
RDW: 22 % — ABNORMAL HIGH (ref 11.5–15.5)
WBC: 16 10*3/uL — ABNORMAL HIGH (ref 4.0–10.5)

## 2022-02-20 NOTE — Progress Notes (Signed)
Patient ID: April [name], female   DOB: [birth year], 81 y.o.   MRN: [id number]

## 2022-02-20 NOTE — Progress Notes (Signed)
   Portal Hypertension Clinic Screening Evaluation   Indication for evaluation: April [name] is a 81 y.o. female undergoing preliminary evaluation in the [facility] due to recurrent ascites.  Referring Physician/Established Gastroenterologist:  Dr. Madolyn Frieze [facility])  Etiology of cirrhosis: Uncertain Initially diagnosed: 2023 # of paracentesis in last month: 2 # of paracentesis in last 2 months: 2 History of hepatic hydrothorax:  no History of hepatic encephalopathy: no  Prior evaluation for liver transplant: no History of hepatocellular carcinoma: no  Prior esophagogastroduodenoscopy/intervention: 02/13/22 - duodenal AVMs treated with APC Current esophageal varices: no Current gastric varices: no History of hematemesis: no  Current diuretic regimen: torsemide 80 mg QD Current pharmacologic encephalopathy prophylaxis/treatment: lactulose 20 g BID  History of renal dysfunction: yes History of hemodialysis: yes  History of cardiac dysfunction: CHF, CAD with STEMI (s/p PCI), left bundle branch block, paroxysmal atrial fribrillation  Other pertinent past medical history: Anemia, ESRD, diabetes mellitus, endometriosis, GERD, hiatal hernia, hypercholesterolemia, sleep apnea, urinary incontinence   Imaging: Prior cross sectional imaging of portal system: None recent  Echocardiogram: 01/11/22 IMPRESSIONS     1. Left ventricular ejection fraction, by estimation, is 55 to 60%. The  left ventricle has normal function. The left ventricle has no regional  wall motion abnormalities. Left ventricular diastolic parameters are  indeterminate.   2. Right ventricular systolic function is normal. The right ventricular  size is normal. There is moderately elevated pulmonary artery systolic  pressure.   3. Left atrial size was severely dilated.   4. Right atrial size was mildly dilated.   5. The mitral valve is normal in  structure. Mild to moderate mitral valve  regurgitation.   6. Tricuspid valve regurgitation is moderate.   7. The aortic valve is tricuspid. Aortic valve regurgitation is not  visualized.   8. The inferior vena cava is normal in size with <50% respiratory  variability, suggesting right atrial pressure of 8 mmHg.    Labs: 10/4-15/23 Creatinine: 2.93 Total Bilirubin: 1.4 INR: 1.2 Sodium: 133 Albumin: 2.3  Child-Pugh = 9 points, class B MELD = 23 (19.6% estimated 3 month mortality) [place] Index of Post-TIPS Survival (FIPS) = 2.05 (overall survival at 1 month 70.0%, 3 months 29.6%, and 6 months 15.8%)    Assessment: April [name] is a 81 y.o. 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  After preliminary evaluation, this patient is a poor candidate for TIPS creation due to multiple comorbidities including advanced age, heart failure, and end stage renal disease.  Recommendation: No further follow up by Interventional Radiology due to poor candidacy for TIPS creation.      Electronically Signed: Suzette Battiest, MD 02/20/2022, 8:00 AM

## 2022-02-20 NOTE — Progress Notes (Signed)
Patient ID: April [name], female   DOB: Mar 17, 1941, 81 y.o.   MRN: [id number]   Subjective:    Patient ID: April [name], female    DOB: May 28, 1940, 81 y.o.   MRN: [id number]  Patient here for  Chief Complaint  Patient presents with   Hospitalization Follow-up    [facility] 10/4-10/15 hypervolemia. Pt c/o weakness today some days she feels good & others worse.    Marland Kitchen   HPI Here for hospital follow up. She is accompanied by her daughter-n-law April [name]).  History obtained from both of them. Admitted 02/01/22 - 02/12/22 - after presenting with weakness and tremors.  Was hypotensive - placed in ICU on IV levophed.  Found to have severe anemia.  Had extensive w/up in March 2023 without obvious source of bleeding.  Received 3 units prbc. 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  Treated with argon plasma coagulation and added PPI bid.  Hgb 7.5 prior at discharge.  Is s/p paracentesis (2.2 liter) .  Started on torsemide 61m daily and recommended continuing on rifaximin 5533mbid.  Continued HD. Was started on midodrine - after levophed stopped.  Reports feeling weak.  Breathing overall stable.  Did notice some black stool today.  No abdominal pain.  Discussed w/up in the hospital.  They had questions regarding outpatient transfusions and requested f/u with Dr April [name] he saw her in the hospital.     Past Medical History:  Diagnosis Date   Anemia    CAD S/P PCI pRCA Promus DES 3.5 x 16 (4.1 mm), ostRPDA Promus DES 2.5 x 12 (2.7 mm) 03/25/2016   CAD S/P percutaneous coronary angioplasty    a. 07/2015 MV: No ischemia, EF 66%;  b. 03/2016 Inflat STEMI/PCI: LM nl, LAD 25d, RI nl, LCX nl, OM1/2 nl, RCA 80p (3.5x16 Promus Premier DES), 50p/m, 30d, RPDA 90 (2.5x12 Promu Premier DES); c. [place]/2018 [place] RCA st[place]Med Rx.   CHF (congestive heart failure) ([place]   CKD (chronic kidney disease), stage III ([place]    Diabetes mellitus without complication ([place]   Diastolic dysfunction    a. 10/2013 Echo: EF 55-65%, Gr1 DD; b. 01/2018 [place] EF 55-60%, Gr2 DD, RVSP 4168m.   Diverticulitis    Dyspnea    Endometriosis    Family history of adverse reaction to anesthesia    Mother - had to stay in ICU because of breathing difficulties every time she had anesthesia   GERD (gastroesophageal reflux disease)    GI bleed    a. 06/2021 - melena-->2 u PRBCs.   Hiatal hernia    History of colon polyps 08/1994   History of Migraines    Hypercholesterolemia    Hypertension    LBBB (left bundle branch block)    Osteoporosis    PAF (paroxysmal atrial fibrillation) ([place]  a. 03/2016 Dx @ time of MI; b. CHA2DS2VASc = 5-->Eliquis.   Rheumatic fever    Sleep apnea    CPAP   Spasmodic dysphonia    ST elevation myocardial infarction (STEMI) of inferolateral wall, initial episode of care ([place]/25/2017   ST elevation myocardial infarction (STEMI) of inferolateral wall, [facility]   Urine incontinence    Past Surgical History:  Procedure Laterality Date   ABDOMINAL HYSTERECTOMY     ovaries not removed   APPENDECTOMY     was  removed during hysterectomy   Breast biopsies     x2   BREAST EXCISIONAL BIOPSY Bilateral "years ago"   neg   BREAST SURGERY Bilateral    BROW LIFT Bilateral 08/15/2019   Procedure: BLEPHAROPLASTY UPPER EYELID; W/EXCESS SKIN BLEPHAROPTOSIS REPAIR; RESECT EX;  Surgeon: April Starch, MD;  Location: [place];  Service: Ophthalmology;  Laterality: Bilateral;  sleep apnea   CARDIAC CATHETERIZATION  2011   moderate 40% RCA disease   CARDIAC CATHETERIZATION  01/2010   Dr. Gollan@[facility] : Only noted 40% RCA   CARDIAC CATHETERIZATION N/A 03/25/2016   Procedure: Left Heart Cath and Coronary Angiography;  Surgeon: 04/07/2016, MD;  Location: [place] CV LAB;  Service: Cardiovascular;  Laterality: N/A;   CARDIAC  CATHETERIZATION N/A 03/25/2016   Procedure: Coronary Stent Intervention;  Surgeon: 04/07/2016, MD;  Location: [place] CV LAB;  Service: Cardiovascular;  Laterality: N/A;   COLONOSCOPY  2013   COLONOSCOPY WITH PROPOFOL N/A 07/11/2021   Procedure: COLONOSCOPY WITH PROPOFOL;  Surgeon: 07/24/2021, MD;  Location[facility] ENDOSCOPY;  Service: Endoscopy;  Laterality: N/A;   DIALYSIS/PERMA CATHETER INSERTION N/A 12/15/2021   Procedure: DIALYSIS/PERMA CATHETER INSERTION;  Surgeon: 12/28/2021, MD;  Location: [place] CV LAB;  Service: Cardiovascular;  Laterality: N/A;   ENTEROSCOPY N/A 02/11/2022   Procedure: ENTEROSCOPY;  Surgeon: [place], 02/24/2022, MD;  Location: [facility] ENDOSCOPY;  Service: Gastroenterology;  Laterality: N/A;   ESOPHAGOGASTRODUODENOSCOPY (EGD) WITH PROPOFOL N/A 03/17/2015   Procedure: ESOPHAGOGASTRODUODENOSCOPY (EGD) WITH PROPOFOL;  Surgeon: 03/30/2015, MD;  Location: [facility] ENDOSCOPY;  Service: Gastroenterology;  Laterality: N/A;   ESOPHAGOGASTRODUODENOSCOPY (EGD) WITH PROPOFOL N/A 07/10/2021   Procedure: ESOPHAGOGASTRODUODENOSCOPY (EGD) WITH PROPOFOL;  Surgeon: 16/03/2022, MD;  Location: [facility] ENDOSCOPY;  Service: Gastroenterology;  Laterality: N/A;   GIVENS CAPSULE STUDY N/A 07/11/2021   Procedure: GIVENS CAPSULE STUDY;  Surgeon: 07/24/2021, MD;  Location: [facility] ENDOSCOPY;  Service: Endoscopy;  Laterality: N/A;   NM MYOVIEW ([place] HX)  07/2015   No evidence ischemia or infarction. EF 66%. Low risk   TEMPORARY DIALYSIS CATHETER N/A 12/08/2021   Procedure: TEMPORARY DIALYSIS CATHETER;  Surgeon: 21/01/2022, MD;  Location: [place] CV LAB;  Service: Cardiovascular;  Laterality: N/A;   TONSILLECTOMY     TRANSTHORACIC ECHOCARDIOGRAM  10/2013   Normal LV size and function. EF 55-65%. GR 1 DD. Otherwise normal.   Family History  Problem Relation Age of Onset   Arthritis Mother    Stroke Mother    Hypertension Mother    Osteoporosis Mother    Heart  disease Father        MI   Heart attack Father    Stroke Brother    Heart attack Sister    Breast cancer Other        first cousin x 2   Stroke Daughter    Heart attack Daughter    Colon cancer Neg Hx    Social History   Socioeconomic History   Marital status: Widowed    Spouse name: Not on file   Number of children: 2   Years of education: Not on file   Highest education level: Not on file  Occupational History   Not on file  Tobacco Use   Smoking status: Never   Smokeless tobacco: Never  Vaping Use   Vaping Use: Never used  Substance and Sexual Activity   Alcohol use: No    Alcohol/week: 0.0 standard drinks of alcohol  Drug use: No   Sexual activity: Not Currently    Birth control/protection: Post-menopausal  Other Topics Concern   Not on file  Social History Narrative   Not on file   Social Determinants of Health   Financial Resource Strain: Not on file  Food Insecurity: Not on file  Transportation Needs: Not on file  Physical Activity: Not on file  Stress: Not on file  Social Connections: Not on file     Review of Systems  Constitutional:  Positive for appetite change and fatigue.  HENT:  Negative for congestion and sinus pressure.   Respiratory:  Negative for cough and chest tightness.        Chronic sob.    Cardiovascular:  Positive for leg swelling. Negative for chest pain and palpitations.  Gastrointestinal:  Negative for vomiting.       Black stool.  No increased abdominal pain.   Genitourinary:  Negative for difficulty urinating and dysuria.  Musculoskeletal:  Negative for joint swelling and myalgias.  Skin:  Negative for color change and rash.  Neurological:  Negative for dizziness and headaches.  Psychiatric/Behavioral:  Negative for agitation and dysphoric mood.        Objective:     BP 120/60 (BP Location: Left Arm, Patient Position: Sitting, Cuff Size: Normal)   Pulse 95   Temp 98 F (36.7 C) (Oral)   Resp 14   Ht 5' (1.524 m)    Wt 176 lb 2.4 oz (79.9 kg)   LMP 05/01/1972   SpO2 97%   BMI 34.40 kg/m  Wt Readings from Last 3 Encounters:  02/20/22 176 lb 2.4 oz (79.9 kg)  02/11/22 166 lb 7.2 oz (75.5 kg)  02/02/22 178 lb 12.8 oz (81.1 kg)    Physical Exam Vitals reviewed.  Constitutional:      General: She is not in acute distress.    Appearance: Normal appearance.  HENT:     Head: Normocephalic and atraumatic.     Right Ear: External ear normal.     Left Ear: External ear normal.  Eyes:     General: No scleral icterus.       Right eye: No discharge.        Left eye: No discharge.     Conjunctiva/sclera: Conjunctivae normal.  Neck:     Thyroid: No thyromegaly.  Cardiovascular:     Rate and Rhythm: Normal rate and regular rhythm.  Pulmonary:     Effort: No respiratory distress.     Breath sounds: Normal breath sounds. No wheezing.  Abdominal:     General: Bowel sounds are normal.     Palpations: Abdomen is soft.     Tenderness: There is no abdominal tenderness.  Musculoskeletal:        General: No tenderness.     Cervical back: Neck supple. No tenderness.     Comments: Lower extremity swelling - bilateral.   Lymphadenopathy:     Cervical: No cervical adenopathy.  Skin:    Findings: No erythema or rash.  Neurological:     Mental Status: She is alert.  Psychiatric:        Mood and Affect: Mood normal.        Behavior: Behavior normal.      Outpatient Encounter Medications as of 02/20/2022  Medication Sig   acetaminophen (TYLENOL) 325 MG tablet Take 650 mg by mouth every 4 (four) hours as needed for mild pain or fever.   amiodarone (PACERONE) 200 MG tablet Take 1 tablet (  200 mg total) by mouth daily.   docusate sodium (STOOL SOFTENER) 100 MG capsule Take 1 capsule (100 mg total) by mouth 2 (two) times daily.   epoetin alfa (EPOGEN) 10000 UNIT/ML injection Inject 0.8 mLs (8,000 Units total) into the vein Every Tuesday,Thursday,and Saturday with dialysis.   midodrine (PROAMATINE) 10 MG  tablet Take 1 tablet (10 mg total) by mouth 3 (three) times daily with meals.   Multiple Vitamins-Minerals (MULTIVITAMIN WITH MINERALS) tablet Take 1 tablet by mouth daily.   nitroGLYCERIN (NITROSTAT) 0.4 MG SL tablet Place 1 tablet (0.4 mg total) under the tongue every 5 (five) minutes as needed for chest pain.   pantoprazole (PROTONIX) 40 MG tablet Take 1 tablet (40 mg total) by mouth 2 (two) times daily before a meal.   rifaximin (XIFAXAN) 550 MG TABS tablet Take 1 tablet (550 mg total) by mouth 2 (two) times daily.   torsemide 40 MG TABS Take 80 mg by mouth daily.   ipratropium-albuterol (DUONEB) 0.5-2.5 (3) MG/3ML SOLN Take 3 mLs by nebulization every 6 (six) hours as needed. (Patient not taking: Reported on 02/20/2022)   [DISCONTINUED] albuterol (VENTOLIN HFA) 108 (90 Base) MCG/ACT inhaler Inhale 2 puffs into the lungs every 6 (six) hours as needed for wheezing or shortness of breath. (Patient not taking: Reported on 02/20/2022)   [DISCONTINUED] Amino Acids-Protein Hydrolys (FEEDING SUPPLEMENT, PRO-STAT SUGAR FREE 64,) LIQD Take 30 mLs by mouth 3 (three) times daily with meals. (Patient not taking: Reported on 02/20/2022)   [DISCONTINUED] diclofenac Sodium (VOLTAREN) 1 % GEL Apply 2 g topically 3 (three) times daily as needed (pain in legs and elbows). (Patient not taking: Reported on 02/20/2022)   [DISCONTINUED] famotidine (PEPCID) 20 MG tablet Take 20 mg by mouth 2 (two) times daily. (Patient not taking: Reported on 02/20/2022)   [DISCONTINUED] lactulose (CHRONULAC) 10 GM/15ML solution Take 30 mLs (20 g total) by mouth 2 (two) times daily. (Patient not taking: Reported on 02/20/2022)   [DISCONTINUED] lidocaine (LIDODERM) 5 % Place 1 patch onto the skin daily. Home med. (Patient not taking: Reported on 02/20/2022)   No facility-administered encounter medications on file as of 02/20/2022.     Lab Results  Component Value Date   WBC 16.0 (H) 02/20/2022   HGB 8.7 Repeated and verified X2. (L)  02/20/2022   HCT 27.3 (L) 02/20/2022   PLT 124.0 (L) 02/20/2022   GLUCOSE 139 (H) 02/12/2022   CHOL 129 01/20/2021   TRIG 80.0 01/20/2021   HDL 50.90 01/20/2021   LDLCALC 62 01/20/2021   ALT 36 02/04/2022   AST 59 (H) 02/04/2022   NA 133 (L) 02/12/2022   K 4.0 02/12/2022   CL 98 02/12/2022   CREATININE 2.93 (H) 02/12/2022   BUN 25 (H) 02/12/2022   CO2 28 02/12/2022   TSH 1.482 11/24/2021   INR 1.2 02/01/2022   HGBA1C 6.3 09/21/2021   MICROALBUR <0.7 04/29/2020    CT Abdomen Pelvis Wo Contrast  Result Date: 02/01/2022 CLINICAL DATA:  Abdominal pain, acute, nonlocalized EXAM: CT ABDOMEN AND PELVIS WITHOUT CONTRAST TECHNIQUE: Multidetector CT imaging of the abdomen and pelvis was performed following the standard protocol without IV contrast. RADIATION DOSE REDUCTION: This exam was performed according to the departmental dose-optimization program which includes automated exposure control, adjustment of the mA and/or kV according to patient size and/or use of iterative reconstruction technique. COMPARISON:  11/29/2020 FINDINGS: Lower chest: Small left and trace right pleural effusions with associated compressive atelectasis. Heart size within normal limits. Relative hypoattenuation of the  cardiac blood pool indicative of anemia. Moderate-large hiatal hernia. Hepatobiliary: Shrunken, nodular appearance of the liver compatible with cirrhosis. No definite liver lesion identified on unenhanced images. Moderately distended gallbladder. No hyperdense gallstone. Pancreas: Unremarkable. No pancreatic ductal dilatation or surrounding inflammatory changes. Spleen: Normal in size without focal abnormality. Adrenals/Urinary Tract: Adrenal glands are unremarkable. Kidneys are normal, without renal calculi, solid lesion, or hydronephrosis. Bladder is unremarkable. Stomach/Bowel: Moderate-large hiatal hernia containing the majority of the stomach within the chest. Appendix not visualized, and may be surgically  absent. Left-sided colonic diverticulosis. No evidence of bowel wall thickening, distention, or inflammatory changes. Vascular/Lymphatic: Aortic atherosclerosis. No enlarged abdominal or pelvic lymph nodes. Reproductive: Status post hysterectomy. No adnexal masses. Other: Moderate volume ascites. No organized abdominopelvic fluid collections. No pneumoperitoneum. Musculoskeletal: Anasarca.  No acute bony findings. IMPRESSION: 1. Cirrhotic liver with moderate volume ascites. 2. Small left and trace right pleural effusions with associated compressive atelectasis. 3. Moderate-large hiatal hernia containing the majority of the stomach within the chest. 4. Left-sided colonic diverticulosis without evidence for acute diverticulitis. 5. Anasarca. 6. Aortic atherosclerosis (ICD10-I70.0). 7. Relative hypoattenuation of the cardiac blood pool indicative of anemia. Electronically Signed   By: Davina Poke D.O.   On: 02/01/2022 13:40   DG Chest 2 View  Result Date: 02/01/2022 CLINICAL DATA:  Weakness with vomiting.  Suspected sepsis. EXAM: CHEST - 2 VIEW COMPARISON:  Radiographs 01/11/2022 and 01/09/2022.  CT 01/11/2022. FINDINGS: Right IJ hemodialysis catheter is unchanged, projecting to the level of the upper right atrium. The heart size and mediastinal contours are stable with a large hiatal hernia. Unchanged small bilateral pleural effusions with associated left greater than right basilar atelectasis. No edema or confluent airspace opacity identified. There is no pneumothorax. The bones appear unchanged. IMPRESSION: Stable chest with small bilateral pleural effusions and bibasilar atelectasis. Large hiatal hernia. No acute findings. Electronically Signed   By: Richardean Sale M.D.   On: 02/01/2022 12:36       Assessment & Plan:   Problem List Items Addressed This Visit     Anemia of chronic kidney failure, stage 4 (severe) (HCC) - Primary   Relevant Orders   CBC with Differential/Platelet (Completed)    Aortic atherosclerosis ([place])    Off crestor due to cirrhosis.  Follow.       Chronic diastolic CHF (congestive heart failure) (HCC)    Continues dialysis and torsemide.         Chronic obstructive pulmonary disease, unspecified COPD type ([place])    Breathing stable.  Chronic sob.       Cirrhosis ([place])    On xifaxan.  Discussed need for f/u with GI.       Coronary artery disease involving native coronary artery of native heart with unstable angina pectoris (HCC)    No aspirin due to GI bleeding.  No statin due to cirrhosis.        Diabetes mellitus ([place])    Low carb diet and exercise.  Follow met b and a1c.       Edema and ascites     S/p paracentesis.  Follow.        ESRD on dialysis [facility])    Dialysis TTS.  Taking torsemide.       GERD (gastroesophageal reflux disease)    Continues on protonix.       GI bleeding    Off anticoagulation due to concern regarding GI bleed.  Colonoscopy 06/2021 - tubular adenoma, diverticulosis and non bleeding internal hemorrhoid.  EGD  negative H. Pylori, mildly erythematous gastric mucosa with no obvious bleeding.   Recent admission - found to have severe anemia.  Had extensive w/up in March 2023 without obvious source of bleeding.  Received 3 units prbc. 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  Treated with argon plasma coagulation and added PPI bid.  Hgb 7.5 prior at discharge.   melana today.  Check cbc.  Discuss with Gi regarding f/u.  Contact hematology regarding outpatient transfusions.        HTN (hypertension)    Blood pressure as outlined.  Continues on torsemide. Now on 32m q day. Follow pressures.  Follow metabolic panel. Chemistry labs to be checked at dialysis.       Lower extremity edema    Taking 84mtorsemide daily now.  Follow.  Continue dialysis.       OSA on CPAP    Continue cpap.       Paroxysmal atrial fibrillation (HCC)    Not on anticoagulation due  to anemia and concern for GI bleeding.  On amiodarone.  Follow.        Thrombocytopenia (HCC)    History of cirrhosis.  Check cbc today.         ChEinar PheasantMD

## 2022-02-21 ENCOUNTER — Telehealth: Payer: Self-pay | Admitting: Internal Medicine

## 2022-02-21 ENCOUNTER — Other Ambulatory Visit: Payer: Self-pay | Admitting: Internal Medicine

## 2022-02-21 DIAGNOSIS — N250 Renal osteodystrophy: Secondary | ICD-10-CM | POA: Diagnosis not present

## 2022-02-21 DIAGNOSIS — D509 Iron deficiency anemia, unspecified: Secondary | ICD-10-CM | POA: Diagnosis not present

## 2022-02-21 DIAGNOSIS — N189 Chronic kidney disease, unspecified: Secondary | ICD-10-CM | POA: Diagnosis not present

## 2022-02-21 DIAGNOSIS — D649 Anemia, unspecified: Secondary | ICD-10-CM

## 2022-02-21 DIAGNOSIS — N179 Acute kidney failure, unspecified: Secondary | ICD-10-CM | POA: Diagnosis not present

## 2022-02-21 NOTE — Progress Notes (Signed)
Lab orders entered

## 2022-02-21 NOTE — Telephone Encounter (Signed)
Pt son would like the cma to call him about pt blood work

## 2022-02-21 NOTE — Addendum Note (Signed)
Addended by: Vanice Sarah on: 02/21/2022 02:11 PM   Modules accepted: Orders

## 2022-02-21 NOTE — Chronic Care Management (AMB) (Signed)
  Care Coordination  Outreach Note  02/21/2022 Name: April [name] MRN: [id number] DOB: Mar 04, 1941   Care Coordination Outreach Attempts: A second unsuccessful outreach was attempted today to offer the patient with information about available care coordination services as a benefit of their health plan.     Referral received   Follow Up Plan:  Additional outreach attempts will be made to offer the patient care coordination information and services.   Encounter Outcome:  No Answer  Julian Hy, [place] Direct Dial: [phone]

## 2022-02-21 NOTE — Progress Notes (Signed)
Please schedule appointment in the next 2 weeks or so; MD-labs; CBC BMP; hold tube; iron studies ferritin; Venofer.  D-2 possible blood transfusion.    Thanks GB  Dr.Scott-FYI.

## 2022-02-22 ENCOUNTER — Telehealth: Payer: Self-pay | Admitting: Internal Medicine

## 2022-02-22 DIAGNOSIS — I2699 Other pulmonary embolism without acute cor pulmonale: Secondary | ICD-10-CM | POA: Diagnosis not present

## 2022-02-22 DIAGNOSIS — K31819 Angiodysplasia of stomach and duodenum without bleeding: Secondary | ICD-10-CM | POA: Diagnosis not present

## 2022-02-22 DIAGNOSIS — I5033 Acute on chronic diastolic (congestive) heart failure: Secondary | ICD-10-CM | POA: Diagnosis not present

## 2022-02-22 DIAGNOSIS — D500 Iron deficiency anemia secondary to blood loss (chronic): Secondary | ICD-10-CM | POA: Diagnosis not present

## 2022-02-22 DIAGNOSIS — I130 Hypertensive heart and chronic kidney disease with heart failure and stage 1 through stage 4 chronic kidney disease, or unspecified chronic kidney disease: Secondary | ICD-10-CM | POA: Diagnosis not present

## 2022-02-22 DIAGNOSIS — K7460 Unspecified cirrhosis of liver: Secondary | ICD-10-CM | POA: Diagnosis not present

## 2022-02-22 NOTE — Telephone Encounter (Signed)
If stool is darker and she is getting weaker, then she is going to have to be reevaluated.  If bleeding, may need to be transfused again, etc.

## 2022-02-22 NOTE — Telephone Encounter (Signed)
Informed pt that Dr.Scott s/w Lauren in regards to pt's lab results & was informed to  message to Dr Rogue Bussing.  Also f/u with Dr Alice Reichert. See result note.

## 2022-02-22 NOTE — Telephone Encounter (Signed)
Pt son called stating pt stool is getting darker. The son is not with the pt so I could not triage

## 2022-02-23 ENCOUNTER — Telehealth: Payer: Self-pay

## 2022-02-23 DIAGNOSIS — D509 Iron deficiency anemia, unspecified: Secondary | ICD-10-CM | POA: Diagnosis not present

## 2022-02-23 DIAGNOSIS — D649 Anemia, unspecified: Secondary | ICD-10-CM | POA: Diagnosis not present

## 2022-02-23 DIAGNOSIS — N179 Acute kidney failure, unspecified: Secondary | ICD-10-CM | POA: Diagnosis not present

## 2022-02-23 DIAGNOSIS — N189 Chronic kidney disease, unspecified: Secondary | ICD-10-CM | POA: Diagnosis not present

## 2022-02-23 DIAGNOSIS — N250 Renal osteodystrophy: Secondary | ICD-10-CM | POA: Diagnosis not present

## 2022-02-23 NOTE — Telephone Encounter (Signed)
Spoke with Mr. Moder in regards to his mother and he stated that she has a GI appt in the am but he was not 100% sure he would CB once he found out. I told him if she does that's great and to definitely CB to book a f/u appt with you.

## 2022-02-23 NOTE — Telephone Encounter (Signed)
Spoke with pts son in regards to his phone call and what Dr. Nicki Reaper advised:    Einar Pheasant, MD  Denita Lung A, CMA 23 hours ago (3:10 PM)    If stool is darker and she is getting weaker, then she is going to have to be reevaluated.  If bleeding, may need to be transfused again, etc.        Note     You  Einar Pheasant, MD Yesterday (11:26 AM)    Called spoke w/ daughter in law she states the stool is much darker, pt is still feeling weak and is trembling. She states there is no fever or pain at all no pain upon voiding bowels either.     Burt Knack D routed conversation to Zacarias Pontes, [place] (11:05 AM)   Wandra Scot Yesterday (11:05 AM)   AD Pt son called stating pt stool is getting darker. The son is not with the pt so I could not triage      Note   Mr. Camera stated that he believes his mother has an appt with a GI MD in the AM he was not a 100% sure but he will CB to let us know for sure. I told Mr. Whan that if she does he can definitely book a follow up appt with Dr. Nicki Reaper as well he verbalized understanding.

## 2022-02-24 DIAGNOSIS — I130 Hypertensive heart and chronic kidney disease with heart failure and stage 1 through stage 4 chronic kidney disease, or unspecified chronic kidney disease: Secondary | ICD-10-CM | POA: Diagnosis not present

## 2022-02-24 DIAGNOSIS — Z992 Dependence on renal dialysis: Secondary | ICD-10-CM | POA: Diagnosis not present

## 2022-02-24 DIAGNOSIS — R188 Other ascites: Secondary | ICD-10-CM | POA: Diagnosis not present

## 2022-02-24 DIAGNOSIS — I2699 Other pulmonary embolism without acute cor pulmonale: Secondary | ICD-10-CM | POA: Diagnosis not present

## 2022-02-24 DIAGNOSIS — D509 Iron deficiency anemia, unspecified: Secondary | ICD-10-CM | POA: Diagnosis not present

## 2022-02-24 DIAGNOSIS — I5033 Acute on chronic diastolic (congestive) heart failure: Secondary | ICD-10-CM | POA: Diagnosis not present

## 2022-02-24 DIAGNOSIS — D500 Iron deficiency anemia secondary to blood loss (chronic): Secondary | ICD-10-CM | POA: Diagnosis not present

## 2022-02-24 DIAGNOSIS — N186 End stage renal disease: Secondary | ICD-10-CM | POA: Diagnosis not present

## 2022-02-24 DIAGNOSIS — K31819 Angiodysplasia of stomach and duodenum without bleeding: Secondary | ICD-10-CM | POA: Diagnosis not present

## 2022-02-24 DIAGNOSIS — K7460 Unspecified cirrhosis of liver: Secondary | ICD-10-CM | POA: Diagnosis not present

## 2022-02-24 NOTE — Telephone Encounter (Signed)
Confirmed she does have GI appt 02/24/22.  Reviewed - had been informed if persistent black stool and weakness - needs to be evaluated.

## 2022-02-24 NOTE — Chronic Care Management (AMB) (Signed)
  Care Coordination   Note   02/24/2022 Name: April [name] MRN: [id number] DOB: 04-Dec-1940  April [name] is a 81 y.o. year old female who sees Einar Pheasant, MD for primary care. I reached out to [name] by phone today to offer care coordination services.  April [name] was given information about Care Coordination services today including:   The Care Coordination services include support from the care team which includes your Nurse Coordinator, Clinical Social Worker, or Pharmacist.  The Care Coordination team is here to help remove barriers to the health concerns and goals most important to you. Care Coordination services are voluntary, and the patient may decline or stop services at any time by request to their care team member.   Care Coordination Consent Status: Patient did not agree to participate in care coordination services at this time.  Follow up plan:  none indicated pt declined referral   Encounter Outcome:  Pt. Refused  Julian Hy, [place] Direct Dial: [phone]

## 2022-02-25 DIAGNOSIS — N250 Renal osteodystrophy: Secondary | ICD-10-CM | POA: Diagnosis not present

## 2022-02-25 DIAGNOSIS — D649 Anemia, unspecified: Secondary | ICD-10-CM | POA: Diagnosis not present

## 2022-02-25 DIAGNOSIS — D509 Iron deficiency anemia, unspecified: Secondary | ICD-10-CM | POA: Diagnosis not present

## 2022-02-25 DIAGNOSIS — N189 Chronic kidney disease, unspecified: Secondary | ICD-10-CM | POA: Diagnosis not present

## 2022-02-25 DIAGNOSIS — N179 Acute kidney failure, unspecified: Secondary | ICD-10-CM | POA: Diagnosis not present

## 2022-02-26 ENCOUNTER — Encounter: Payer: Self-pay | Admitting: Internal Medicine

## 2022-02-26 NOTE — Assessment & Plan Note (Signed)
Off crestor due to cirrhosis.  Follow.

## 2022-02-26 NOTE — Assessment & Plan Note (Signed)
No aspirin due to GI bleeding.  No statin due to cirrhosis.

## 2022-02-26 NOTE — Assessment & Plan Note (Signed)
Taking '80mg'$  torsemide daily now.  Follow.  Continue dialysis.

## 2022-02-26 NOTE — Assessment & Plan Note (Signed)
S/p paracentesis.  Follow.

## 2022-02-26 NOTE — Assessment & Plan Note (Signed)
Dialysis TTS.  Taking torsemide.

## 2022-02-26 NOTE — Assessment & Plan Note (Signed)
History of cirrhosis.  Check cbc today.

## 2022-02-26 NOTE — Assessment & Plan Note (Signed)
Low carb diet and exercise.  Follow met b and a1c.  

## 2022-02-26 NOTE — Assessment & Plan Note (Signed)
Off anticoagulation due to concern regarding GI bleed.  Colonoscopy 06/2021 - tubular adenoma, diverticulosis and non bleeding internal hemorrhoid.  EGD negative H. Pylori, mildly erythematous gastric mucosa with no obvious bleeding.   Recent admission - found to have severe anemia.  Had extensive w/up in March 2023 without obvious source of bleeding.  Received 3 units prbc. 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  Treated with argon plasma coagulation and added PPI bid.  Hgb 7.5 prior at discharge.   melana today.  Check cbc.  Discuss with Gi regarding f/u.  Contact hematology regarding outpatient transfusions.

## 2022-02-26 NOTE — Assessment & Plan Note (Signed)
Continues on protonix.  ?

## 2022-02-26 NOTE — Assessment & Plan Note (Signed)
Breathing stable.  Chronic sob.

## 2022-02-26 NOTE — Assessment & Plan Note (Signed)
Continue cpap.  

## 2022-02-26 NOTE — Assessment & Plan Note (Signed)
Continues dialysis and torsemide.

## 2022-02-26 NOTE — Assessment & Plan Note (Signed)
Blood pressure as outlined.  Continues on torsemide. Now on '80mg'$  q day. Follow pressures.  Follow metabolic panel. Chemistry labs to be checked at dialysis.

## 2022-02-26 NOTE — Assessment & Plan Note (Signed)
On xifaxan.  Discussed need for f/u with GI.

## 2022-02-26 NOTE — Assessment & Plan Note (Signed)
Not on anticoagulation due to anemia and concern for GI bleeding.  On amiodarone.  Follow.

## 2022-02-27 ENCOUNTER — Encounter: Payer: Self-pay | Admitting: Internal Medicine

## 2022-02-27 ENCOUNTER — Encounter (INDEPENDENT_AMBULATORY_CARE_PROVIDER_SITE_OTHER): Payer: Self-pay

## 2022-02-27 DIAGNOSIS — I2699 Other pulmonary embolism without acute cor pulmonale: Secondary | ICD-10-CM | POA: Diagnosis not present

## 2022-02-27 DIAGNOSIS — D500 Iron deficiency anemia secondary to blood loss (chronic): Secondary | ICD-10-CM | POA: Diagnosis not present

## 2022-02-27 DIAGNOSIS — I5033 Acute on chronic diastolic (congestive) heart failure: Secondary | ICD-10-CM | POA: Diagnosis not present

## 2022-02-27 DIAGNOSIS — K7460 Unspecified cirrhosis of liver: Secondary | ICD-10-CM | POA: Diagnosis not present

## 2022-02-27 DIAGNOSIS — I130 Hypertensive heart and chronic kidney disease with heart failure and stage 1 through stage 4 chronic kidney disease, or unspecified chronic kidney disease: Secondary | ICD-10-CM | POA: Diagnosis not present

## 2022-02-27 DIAGNOSIS — K31819 Angiodysplasia of stomach and duodenum without bleeding: Secondary | ICD-10-CM | POA: Diagnosis not present

## 2022-02-28 DIAGNOSIS — Z992 Dependence on renal dialysis: Secondary | ICD-10-CM | POA: Diagnosis not present

## 2022-02-28 DIAGNOSIS — N179 Acute kidney failure, unspecified: Secondary | ICD-10-CM | POA: Diagnosis not present

## 2022-02-28 DIAGNOSIS — N189 Chronic kidney disease, unspecified: Secondary | ICD-10-CM | POA: Diagnosis not present

## 2022-02-28 DIAGNOSIS — N250 Renal osteodystrophy: Secondary | ICD-10-CM | POA: Diagnosis not present

## 2022-02-28 DIAGNOSIS — N186 End stage renal disease: Secondary | ICD-10-CM | POA: Diagnosis not present

## 2022-02-28 DIAGNOSIS — D649 Anemia, unspecified: Secondary | ICD-10-CM | POA: Diagnosis not present

## 2022-02-28 DIAGNOSIS — D509 Iron deficiency anemia, unspecified: Secondary | ICD-10-CM | POA: Diagnosis not present

## 2022-02-28 NOTE — Telephone Encounter (Signed)
S/w pt son [name] - stated pt still with confusion and swelling, doesn't believe it has gotten any worse in the past 24hrs. Waiting on hospital to call regarding procedure to remove fluid ( paracentesis )  Advised [name] you are working on getting information regarding hospice. Stated agreeable to palliative care.  Advised skilled nursing would be a good idea - stated had already spoken to [facility] to get her set up for that.  Stated he will call them and tell them that you agree, and see what they need from [place]. I advised [name] they probably need an FL2 form filled out. Stated he will get whatever they need and bring it to [place] so we can fill it out.

## 2022-02-28 NOTE — Telephone Encounter (Signed)
Noted.  Let me know if I need to do anything or complete forms.

## 2022-02-28 NOTE — Telephone Encounter (Signed)
Please call and see how pt is doing now.  The confusion and increased swelling, I feel is related to her liver.  I am trying to get information from hospice.  I do feel skilled nursing is a good idea - for increased nursing care, etc.  Are they ok with palliative care until I can find out more about hospice.

## 2022-03-01 ENCOUNTER — Other Ambulatory Visit: Payer: Self-pay | Admitting: Gastroenterology

## 2022-03-01 DIAGNOSIS — D649 Anemia, unspecified: Secondary | ICD-10-CM | POA: Diagnosis not present

## 2022-03-01 DIAGNOSIS — N189 Chronic kidney disease, unspecified: Secondary | ICD-10-CM | POA: Diagnosis not present

## 2022-03-01 DIAGNOSIS — K7460 Unspecified cirrhosis of liver: Secondary | ICD-10-CM

## 2022-03-01 DIAGNOSIS — D500 Iron deficiency anemia secondary to blood loss (chronic): Secondary | ICD-10-CM | POA: Diagnosis not present

## 2022-03-01 DIAGNOSIS — D509 Iron deficiency anemia, unspecified: Secondary | ICD-10-CM | POA: Diagnosis not present

## 2022-03-01 DIAGNOSIS — K31819 Angiodysplasia of stomach and duodenum without bleeding: Secondary | ICD-10-CM | POA: Diagnosis not present

## 2022-03-01 DIAGNOSIS — N179 Acute kidney failure, unspecified: Secondary | ICD-10-CM | POA: Diagnosis not present

## 2022-03-01 DIAGNOSIS — N250 Renal osteodystrophy: Secondary | ICD-10-CM | POA: Diagnosis not present

## 2022-03-01 DIAGNOSIS — Z992 Dependence on renal dialysis: Secondary | ICD-10-CM | POA: Diagnosis not present

## 2022-03-01 DIAGNOSIS — I2699 Other pulmonary embolism without acute cor pulmonale: Secondary | ICD-10-CM | POA: Diagnosis not present

## 2022-03-01 DIAGNOSIS — N186 End stage renal disease: Secondary | ICD-10-CM | POA: Diagnosis not present

## 2022-03-01 DIAGNOSIS — I5033 Acute on chronic diastolic (congestive) heart failure: Secondary | ICD-10-CM | POA: Diagnosis not present

## 2022-03-01 DIAGNOSIS — I130 Hypertensive heart and chronic kidney disease with heart failure and stage 1 through stage 4 chronic kidney disease, or unspecified chronic kidney disease: Secondary | ICD-10-CM | POA: Diagnosis not present

## 2022-03-02 ENCOUNTER — Other Ambulatory Visit: Payer: Self-pay

## 2022-03-02 ENCOUNTER — Emergency Department: Payer: Medicare Other

## 2022-03-02 ENCOUNTER — Inpatient Hospital Stay
Admission: EM | Admit: 2022-03-02 | Discharge: 2022-03-08 | DRG: 064 | Disposition: A | Discharge status: Skilled Nursing Facility | Payer: Medicare Other | Attending: Internal Medicine | Admitting: Internal Medicine

## 2022-03-02 ENCOUNTER — Telehealth: Payer: Self-pay

## 2022-03-02 DIAGNOSIS — I672 Cerebral atherosclerosis: Secondary | ICD-10-CM | POA: Diagnosis not present

## 2022-03-02 DIAGNOSIS — K7581 Nonalcoholic steatohepatitis (NASH): Secondary | ICD-10-CM | POA: Diagnosis present

## 2022-03-02 DIAGNOSIS — D649 Anemia, unspecified: Secondary | ICD-10-CM | POA: Diagnosis not present

## 2022-03-02 DIAGNOSIS — S065X0A Traumatic subdural hemorrhage without loss of consciousness, initial encounter: Secondary | ICD-10-CM | POA: Diagnosis not present

## 2022-03-02 DIAGNOSIS — R29701 NIHSS score 1: Secondary | ICD-10-CM | POA: Diagnosis present

## 2022-03-02 DIAGNOSIS — Z992 Dependence on renal dialysis: Secondary | ICD-10-CM

## 2022-03-02 DIAGNOSIS — G4733 Obstructive sleep apnea (adult) (pediatric): Secondary | ICD-10-CM | POA: Diagnosis present

## 2022-03-02 DIAGNOSIS — Z823 Family history of stroke: Secondary | ICD-10-CM

## 2022-03-02 DIAGNOSIS — N179 Acute kidney failure, unspecified: Secondary | ICD-10-CM | POA: Diagnosis not present

## 2022-03-02 DIAGNOSIS — I6201 Nontraumatic acute subdural hemorrhage: Secondary | ICD-10-CM | POA: Diagnosis present

## 2022-03-02 DIAGNOSIS — Z66 Do not resuscitate: Secondary | ICD-10-CM | POA: Diagnosis present

## 2022-03-02 DIAGNOSIS — J449 Chronic obstructive pulmonary disease, unspecified: Secondary | ICD-10-CM | POA: Diagnosis present

## 2022-03-02 DIAGNOSIS — E871 Hypo-osmolality and hyponatremia: Secondary | ICD-10-CM | POA: Diagnosis not present

## 2022-03-02 DIAGNOSIS — K7460 Unspecified cirrhosis of liver: Secondary | ICD-10-CM | POA: Diagnosis not present

## 2022-03-02 DIAGNOSIS — R410 Disorientation, unspecified: Secondary | ICD-10-CM | POA: Diagnosis not present

## 2022-03-02 DIAGNOSIS — I5032 Chronic diastolic (congestive) heart failure: Secondary | ICD-10-CM | POA: Diagnosis present

## 2022-03-02 DIAGNOSIS — Z8262 Family history of osteoporosis: Secondary | ICD-10-CM

## 2022-03-02 DIAGNOSIS — K219 Gastro-esophageal reflux disease without esophagitis: Secondary | ICD-10-CM | POA: Diagnosis not present

## 2022-03-02 DIAGNOSIS — E1151 Type 2 diabetes mellitus with diabetic peripheral angiopathy without gangrene: Secondary | ICD-10-CM | POA: Diagnosis present

## 2022-03-02 DIAGNOSIS — E7800 Pure hypercholesterolemia, unspecified: Secondary | ICD-10-CM | POA: Diagnosis present

## 2022-03-02 DIAGNOSIS — R188 Other ascites: Secondary | ICD-10-CM | POA: Diagnosis not present

## 2022-03-02 DIAGNOSIS — I2510 Atherosclerotic heart disease of native coronary artery without angina pectoris: Secondary | ICD-10-CM | POA: Diagnosis present

## 2022-03-02 DIAGNOSIS — I480 Paroxysmal atrial fibrillation: Secondary | ICD-10-CM | POA: Diagnosis not present

## 2022-03-02 DIAGNOSIS — Z7901 Long term (current) use of anticoagulants: Secondary | ICD-10-CM

## 2022-03-02 DIAGNOSIS — I6200 Nontraumatic subdural hemorrhage, unspecified: Secondary | ICD-10-CM | POA: Diagnosis present

## 2022-03-02 DIAGNOSIS — D696 Thrombocytopenia, unspecified: Secondary | ICD-10-CM | POA: Diagnosis present

## 2022-03-02 DIAGNOSIS — M6281 Muscle weakness (generalized): Secondary | ICD-10-CM | POA: Diagnosis not present

## 2022-03-02 DIAGNOSIS — D509 Iron deficiency anemia, unspecified: Secondary | ICD-10-CM | POA: Diagnosis not present

## 2022-03-02 DIAGNOSIS — I252 Old myocardial infarction: Secondary | ICD-10-CM

## 2022-03-02 DIAGNOSIS — Z515 Encounter for palliative care: Secondary | ICD-10-CM | POA: Diagnosis not present

## 2022-03-02 DIAGNOSIS — E1122 Type 2 diabetes mellitus with diabetic chronic kidney disease: Secondary | ICD-10-CM | POA: Diagnosis present

## 2022-03-02 DIAGNOSIS — I447 Left bundle-branch block, unspecified: Secondary | ICD-10-CM | POA: Diagnosis present

## 2022-03-02 DIAGNOSIS — E876 Hypokalemia: Secondary | ICD-10-CM | POA: Diagnosis present

## 2022-03-02 DIAGNOSIS — I6523 Occlusion and stenosis of bilateral carotid arteries: Secondary | ICD-10-CM | POA: Diagnosis not present

## 2022-03-02 DIAGNOSIS — N186 End stage renal disease: Secondary | ICD-10-CM | POA: Diagnosis not present

## 2022-03-02 DIAGNOSIS — I10 Essential (primary) hypertension: Secondary | ICD-10-CM | POA: Diagnosis not present

## 2022-03-02 DIAGNOSIS — I440 Atrioventricular block, first degree: Secondary | ICD-10-CM | POA: Diagnosis present

## 2022-03-02 DIAGNOSIS — D631 Anemia in chronic kidney disease: Secondary | ICD-10-CM | POA: Diagnosis present

## 2022-03-02 DIAGNOSIS — K922 Gastrointestinal hemorrhage, unspecified: Secondary | ICD-10-CM | POA: Diagnosis present

## 2022-03-02 DIAGNOSIS — Z9989 Dependence on other enabling machines and devices: Secondary | ICD-10-CM | POA: Diagnosis not present

## 2022-03-02 DIAGNOSIS — G9340 Encephalopathy, unspecified: Secondary | ICD-10-CM | POA: Diagnosis not present

## 2022-03-02 DIAGNOSIS — Z8249 Family history of ischemic heart disease and other diseases of the circulatory system: Secondary | ICD-10-CM

## 2022-03-02 DIAGNOSIS — Z955 Presence of coronary angioplasty implant and graft: Secondary | ICD-10-CM

## 2022-03-02 DIAGNOSIS — I959 Hypotension, unspecified: Secondary | ICD-10-CM | POA: Diagnosis present

## 2022-03-02 DIAGNOSIS — R2689 Other abnormalities of gait and mobility: Secondary | ICD-10-CM | POA: Diagnosis not present

## 2022-03-02 DIAGNOSIS — K7682 Hepatic encephalopathy: Secondary | ICD-10-CM | POA: Diagnosis present

## 2022-03-02 DIAGNOSIS — Z882 Allergy status to sulfonamides status: Secondary | ICD-10-CM

## 2022-03-02 DIAGNOSIS — Z91148 Patient's other noncompliance with medication regimen for other reason: Secondary | ICD-10-CM

## 2022-03-02 DIAGNOSIS — R531 Weakness: Secondary | ICD-10-CM | POA: Diagnosis not present

## 2022-03-02 DIAGNOSIS — J9811 Atelectasis: Secondary | ICD-10-CM | POA: Diagnosis not present

## 2022-03-02 DIAGNOSIS — E7220 Disorder of urea cycle metabolism, unspecified: Principal | ICD-10-CM

## 2022-03-02 DIAGNOSIS — Z4901 Encounter for fitting and adjustment of extracorporeal dialysis catheter: Secondary | ICD-10-CM | POA: Diagnosis not present

## 2022-03-02 DIAGNOSIS — Z8261 Family history of arthritis: Secondary | ICD-10-CM

## 2022-03-02 DIAGNOSIS — N189 Chronic kidney disease, unspecified: Secondary | ICD-10-CM | POA: Diagnosis not present

## 2022-03-02 DIAGNOSIS — R471 Dysarthria and anarthria: Secondary | ICD-10-CM | POA: Diagnosis present

## 2022-03-02 DIAGNOSIS — M810 Age-related osteoporosis without current pathological fracture: Secondary | ICD-10-CM | POA: Diagnosis present

## 2022-03-02 DIAGNOSIS — I5022 Chronic systolic (congestive) heart failure: Secondary | ICD-10-CM | POA: Diagnosis not present

## 2022-03-02 DIAGNOSIS — I482 Chronic atrial fibrillation, unspecified: Secondary | ICD-10-CM | POA: Diagnosis not present

## 2022-03-02 DIAGNOSIS — N2581 Secondary hyperparathyroidism of renal origin: Secondary | ICD-10-CM | POA: Diagnosis present

## 2022-03-02 DIAGNOSIS — I132 Hypertensive heart and chronic kidney disease with heart failure and with stage 5 chronic kidney disease, or end stage renal disease: Secondary | ICD-10-CM | POA: Diagnosis present

## 2022-03-02 DIAGNOSIS — Z79899 Other long term (current) drug therapy: Secondary | ICD-10-CM

## 2022-03-02 DIAGNOSIS — R9431 Abnormal electrocardiogram [ECG] [EKG]: Secondary | ICD-10-CM | POA: Diagnosis present

## 2022-03-02 DIAGNOSIS — K449 Diaphragmatic hernia without obstruction or gangrene: Secondary | ICD-10-CM | POA: Diagnosis not present

## 2022-03-02 DIAGNOSIS — N250 Renal osteodystrophy: Secondary | ICD-10-CM | POA: Diagnosis not present

## 2022-03-02 DIAGNOSIS — R2681 Unsteadiness on feet: Secondary | ICD-10-CM | POA: Diagnosis not present

## 2022-03-02 DIAGNOSIS — E785 Hyperlipidemia, unspecified: Secondary | ICD-10-CM | POA: Diagnosis not present

## 2022-03-02 DIAGNOSIS — J90 Pleural effusion, not elsewhere classified: Secondary | ICD-10-CM | POA: Diagnosis not present

## 2022-03-02 DIAGNOSIS — R4182 Altered mental status, unspecified: Secondary | ICD-10-CM | POA: Diagnosis not present

## 2022-03-02 DIAGNOSIS — Z880 Allergy status to penicillin: Secondary | ICD-10-CM

## 2022-03-02 LAB — CBC
HCT: 25.2 % — ABNORMAL LOW (ref 36.0–46.0)
Hemoglobin: 8.3 g/dL — ABNORMAL LOW (ref 12.0–15.0)
MCH: 33.2 pg (ref 26.0–34.0)
MCHC: 32.9 g/dL (ref 30.0–36.0)
MCV: 100.8 fL — ABNORMAL HIGH (ref 80.0–100.0)
Platelets: 124 10*3/uL — ABNORMAL LOW (ref 150–400)
RBC: 2.5 MIL/uL — ABNORMAL LOW (ref 3.87–5.11)
RDW: 22.9 % — ABNORMAL HIGH (ref 11.5–15.5)
WBC: 21.2 10*3/uL — ABNORMAL HIGH (ref 4.0–10.5)
nRBC: 0.7 % — ABNORMAL HIGH (ref 0.0–0.2)

## 2022-03-02 LAB — URINALYSIS, ROUTINE W REFLEX MICROSCOPIC
Bilirubin Urine: NEGATIVE
Glucose, UA: NEGATIVE mg/dL
Hgb urine dipstick: NEGATIVE
Ketones, ur: 5 mg/dL — AB
Leukocytes,Ua: NEGATIVE
Nitrite: NEGATIVE
Protein, ur: NEGATIVE mg/dL
Specific Gravity, Urine: 1.02 (ref 1.005–1.030)
pH: 5 (ref 5.0–8.0)

## 2022-03-02 LAB — COMPREHENSIVE METABOLIC PANEL
ALT: 40 U/L (ref 0–44)
AST: 93 U/L — ABNORMAL HIGH (ref 15–41)
Albumin: 2.2 g/dL — ABNORMAL LOW (ref 3.5–5.0)
Alkaline Phosphatase: 120 U/L (ref 38–126)
Anion gap: 10 (ref 5–15)
BUN: 14 mg/dL (ref 8–23)
CO2: 25 mmol/L (ref 22–32)
Calcium: 8.2 mg/dL — ABNORMAL LOW (ref 8.9–10.3)
Chloride: 103 mmol/L (ref 98–111)
Creatinine, Ser: 2.65 mg/dL — ABNORMAL HIGH (ref 0.44–1.00)
GFR, Estimated: 17 mL/min — ABNORMAL LOW (ref >60)
Glucose, Bld: 186 mg/dL — ABNORMAL HIGH (ref 70–99)
Potassium: 4.6 mmol/L (ref 3.5–5.1)
Sodium: 138 mmol/L (ref 135–145)
Total Bilirubin: 2 mg/dL — ABNORMAL HIGH (ref 0.3–1.2)
Total Protein: 5.6 g/dL — ABNORMAL LOW (ref 6.5–8.1)

## 2022-03-02 LAB — PROTIME-INR
INR: 1.4 — ABNORMAL HIGH (ref 0.8–1.2)
Prothrombin Time: 17.1 seconds — ABNORMAL HIGH (ref 11.4–15.2)

## 2022-03-02 LAB — LACTIC ACID, PLASMA
Lactic Acid, Venous: 4 mmol/L (ref 0.5–1.9)
Lactic Acid, Venous: 2.7 mmol/L (ref 0.5–1.9)

## 2022-03-02 LAB — AMMONIA: Ammonia: 67 umol/L — ABNORMAL HIGH (ref 9–35)

## 2022-03-02 LAB — PROCALCITONIN: Procalcitonin: 0.72 ng/mL

## 2022-03-02 MED ORDER — MIDODRINE HCL 5 MG PO TABS
10.0000 mg | ORAL_TABLET | Freq: Three times a day (TID) | ORAL | Status: DC
  Administered 2022-03-03 – 2022-03-08 (×12): 10 mg via ORAL
  Filled 2022-03-02 (×12): qty 2

## 2022-03-02 MED ORDER — LACTULOSE 10 GM/15ML PO SOLN
20.0000 g | Freq: Once | ORAL | Status: AC
  Administered 2022-03-02: 20 g via ORAL
  Filled 2022-03-02: qty 30

## 2022-03-02 MED ORDER — LACTULOSE ENEMA
300.0000 mL | Freq: Once | ORAL | Status: AC
  Administered 2022-03-02: 300 mL via RECTAL
  Filled 2022-03-02: qty 300

## 2022-03-02 MED ORDER — LACTULOSE 10 GM/15ML PO SOLN
10.0000 g | Freq: Once | ORAL | Status: DC
  Filled 2022-03-02: qty 30

## 2022-03-02 MED ORDER — RIFAXIMIN 550 MG PO TABS
550.0000 mg | ORAL_TABLET | Freq: Two times a day (BID) | ORAL | Status: DC
  Administered 2022-03-03 – 2022-03-08 (×11): 550 mg via ORAL
  Filled 2022-03-02 (×14): qty 1

## 2022-03-02 NOTE — ED Notes (Signed)
Pt could not recall birthday. Appears more confused.

## 2022-03-02 NOTE — ED Triage Notes (Addendum)
Pt come with c/o hypotension. Pt was at dialysis for treatment today but BP was too low. Pt also has had some increased confusion per family.  Pt has also gotten more weak per family. Pt now uses wheelchair.   Pt is from [place] with 24/7 nursing care.   Pt is A*O to place and self at this time.

## 2022-03-02 NOTE — ED Provider Notes (Signed)
[facility] Provider Note    Event Date/Time   First MD Initiated Contact with Patient 03/02/22 1525     (approximate)   History   Hypotension   HPI  April [name] is a 81 y.o. female here with generalized weakness and confusion.  The patient is somewhat confused limiting history.  According to the patient's family, she has had progressively worsening confusion, particularly worse over the last 24 hours.  The patient has cirrhosis and history of hyperammonemia but does not take lactulose due to the incontinence that is associated with.  She has had some waxing waning mental status but this actually improved after her last dialysis session on Tuesday so family was trying to see how she did.  Starting over the last 24 hours, she has been markedly more confused.  She went to dialysis today and was noted to be off her baseline as well as hypotensive.  She subsequent sent for evaluation.  She denies any complaints on my assessment other than general fatigue and some mild confusion.  Denies any focal numbness or weakness.     Physical Exam   Triage Vital Signs: ED Triage Vitals  Enc Vitals Group     BP 03/02/22 1426 (!) 124/51     Pulse Rate 03/02/22 1426 99     Resp 03/02/22 1426 17     Temp 03/02/22 1426 (!) 97.5 F (36.4 C)     Temp Source 03/02/22 1517 Oral     SpO2 03/02/22 1426 97 %     Weight --      Height --      Head Circumference --      Peak Flow --      Pain Score 03/02/22 1425 0     Pain Loc --      Pain Edu? --      Excl. in [place]? --     Most recent vital signs: Vitals:   03/02/22 1936 03/02/22 2100  BP:  (!) 125/50  Pulse:  92  Resp:  17  Temp: (!) 97.4 F (36.3 C)   SpO2:  95%     General: Awake, no distress.  CV:  Good peripheral perfusion.  Regular rate and rhythm. Resp:  Normal effort.  Slight bilateral rales.  Normal work of breathing. Abd:  Significant distention with fluid wave.  No guarding or rebound.  No  peritonitis..  Other:  2+ pitting edema bilateral lower extremities.   ED Results / Procedures / Treatments   Labs (all labs ordered are listed, but only abnormal results are displayed) Labs Reviewed  COMPREHENSIVE METABOLIC PANEL - Abnormal; Notable for the following components:      Result Value   Glucose, Bld 186 (*)    Creatinine, Ser 2.65 (*)    Calcium 8.2 (*)    Total Protein 5.6 (*)    Albumin 2.2 (*)    AST 93 (*)    Total Bilirubin 2.0 (*)    GFR, Estimated 17 (*)    All other components within normal limits  CBC - Abnormal; Notable for the following components:   WBC 21.2 (*)    RBC 2.50 (*)    Hemoglobin 8.3 (*)    HCT 25.2 (*)    MCV 100.8 (*)    RDW 22.9 (*)    Platelets 124 (*)    nRBC 0.7 (*)    All other components within normal limits  AMMONIA - Abnormal; Notable for the following components:  Ammonia 67 (*)    All other components within normal limits  PROTIME-INR - Abnormal; Notable for the following components:   Prothrombin Time 17.1 (*)    INR 1.4 (*)    All other components within normal limits  LACTIC ACID, PLASMA - Abnormal; Notable for the following components:   Lactic Acid, Venous 4.0 (*)    All other components within normal limits  LACTIC ACID, PLASMA - Abnormal; Notable for the following components:   Lactic Acid, Venous 2.7 (*)    All other components within normal limits  URINALYSIS, ROUTINE W REFLEX MICROSCOPIC - Abnormal; Notable for the following components:   Color, Urine AMBER (*)    APPearance HAZY (*)    Ketones, ur 5 (*)    All other components within normal limits  PROCALCITONIN  CBG MONITORING, ED     EKG Sinus rhythm with first-degree AV block.  PR 264.  Ventricular 96.  QRS 126, QTc 495.  No acute ST elevations.  Left bundle branch block is noted.   RADIOLOGY Chest x-ray: Increased bilateral pleural effusions, atelectasis   I also independently reviewed and agree with radiologist  interpretations.   PROCEDURES:  Critical Care performed: No  .1-3 Lead EKG Interpretation  Performed by: Duffy Bruce, MD Authorized by: Duffy Bruce, MD     Interpretation: normal     ECG rate:  80-100   ECG rate assessment: normal     Rhythm: sinus rhythm     Ectopy: none     Conduction: normal   Comments:     Indication: Weakness      MEDICATIONS ORDERED IN ED: Medications  lactulose (CHRONULAC) 10 GM/15ML solution 20 g (20 g Oral Given 03/02/22 1640)  lactulose (CHRONULAC) enema 200 gm (300 mLs Rectal Given 03/02/22 2040)     IMPRESSION / MDM / ASSESSMENT AND PLAN / ED COURSE  I reviewed the triage vital signs and the nursing notes.                               The patient is on the cardiac monitor to evaluate for evidence of arrhythmia and/or significant heart rate changes.   Ddx:  Differential includes the following, with pertinent life- or limb-threatening emergencies considered:  Hyperammonemia, occult sepsis, other metabolic encephalopathy, polypharmacy, CVA, SDH, seizures with postictal confusion  Patient's presentation is most consistent with acute presentation with potential threat to life or bodily function.  MDM:  81 yo F with h/o severe cirrhosis, ESRD on HD, HTN, here with AMS, confusion. Suspect hyperammonemia given that pt is not on lactulose with h/o same, asterixis, exam c/w this. DDx includes possible AMS from SDH, as CT head obtained and shows acute on chronic SDH. Trace shift. Labs o/w fairly reassuring LA 4 down to 2.7, likely from her underlying cirrhosis. UA negative. Procal 0.7 which is below her baseline - likely chronically elevated 2/2 her liver disease. CMP unremarkable. CBC with leukocytosis of 21.2k but o/w reassuring. Ammonia elevated at 67. CXR is clear.  Discussed CT findings with Dr. Cari Caraway, who recommends repeat CT at 6 hr. Unlikely to need intervention. Discussed with Dr. Sidney Ace who would like to be reconsulted after the  6 hr CT in the event of pt needing transfer. Plan to repeat at 11:49 and admit.   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   Duffy Bruce, MD 03/02/22 2229

## 2022-03-02 NOTE — ED Triage Notes (Signed)
First Nurse Note:  Arrives with daughter in law.  Reports increasing confusion since Sunday.  Patient lives at [place].  Went to dialysis today and BP was low.  Paracentesis scheduled for tomorrow, also PCP ordered an outpatient ammonia level and urinalysis (neither has been collected).

## 2022-03-02 NOTE — ED Notes (Signed)
Critical result. Lactic Acid 4.0. Ellender Hose, MD aware.

## 2022-03-02 NOTE — Telephone Encounter (Signed)
Spoke with patient's son [name] regarding Palliative Care services. He reports patient is having a procedure on Friday and that will determine if [place] remains in [place] or has to move to skilled. He requested a call back on Tuesday 11/7.

## 2022-03-03 ENCOUNTER — Ambulatory Visit
Admission: RE | Admit: 2022-03-03 | Discharge: 2022-03-03 | Disposition: A | Discharge status: Home / Self Care | Payer: Medicare Other | Source: Ambulatory Visit | Attending: Gastroenterology | Admitting: Gastroenterology

## 2022-03-03 DIAGNOSIS — I6523 Occlusion and stenosis of bilateral carotid arteries: Secondary | ICD-10-CM | POA: Diagnosis not present

## 2022-03-03 DIAGNOSIS — K219 Gastro-esophageal reflux disease without esophagitis: Secondary | ICD-10-CM | POA: Diagnosis present

## 2022-03-03 DIAGNOSIS — I2510 Atherosclerotic heart disease of native coronary artery without angina pectoris: Secondary | ICD-10-CM | POA: Diagnosis present

## 2022-03-03 DIAGNOSIS — I959 Hypotension, unspecified: Secondary | ICD-10-CM | POA: Diagnosis present

## 2022-03-03 DIAGNOSIS — K7460 Unspecified cirrhosis of liver: Secondary | ICD-10-CM | POA: Diagnosis present

## 2022-03-03 DIAGNOSIS — M6281 Muscle weakness (generalized): Secondary | ICD-10-CM | POA: Diagnosis not present

## 2022-03-03 DIAGNOSIS — R2689 Other abnormalities of gait and mobility: Secondary | ICD-10-CM | POA: Diagnosis not present

## 2022-03-03 DIAGNOSIS — Z992 Dependence on renal dialysis: Secondary | ICD-10-CM | POA: Diagnosis not present

## 2022-03-03 DIAGNOSIS — I5022 Chronic systolic (congestive) heart failure: Secondary | ICD-10-CM | POA: Diagnosis not present

## 2022-03-03 DIAGNOSIS — Z66 Do not resuscitate: Secondary | ICD-10-CM | POA: Diagnosis present

## 2022-03-03 DIAGNOSIS — G4733 Obstructive sleep apnea (adult) (pediatric): Secondary | ICD-10-CM | POA: Diagnosis not present

## 2022-03-03 DIAGNOSIS — I447 Left bundle-branch block, unspecified: Secondary | ICD-10-CM | POA: Diagnosis present

## 2022-03-03 DIAGNOSIS — R4182 Altered mental status, unspecified: Secondary | ICD-10-CM | POA: Diagnosis present

## 2022-03-03 DIAGNOSIS — E1122 Type 2 diabetes mellitus with diabetic chronic kidney disease: Secondary | ICD-10-CM | POA: Diagnosis present

## 2022-03-03 DIAGNOSIS — K922 Gastrointestinal hemorrhage, unspecified: Secondary | ICD-10-CM | POA: Diagnosis not present

## 2022-03-03 DIAGNOSIS — I5032 Chronic diastolic (congestive) heart failure: Secondary | ICD-10-CM | POA: Diagnosis present

## 2022-03-03 DIAGNOSIS — N186 End stage renal disease: Secondary | ICD-10-CM | POA: Diagnosis present

## 2022-03-03 DIAGNOSIS — D631 Anemia in chronic kidney disease: Secondary | ICD-10-CM | POA: Diagnosis present

## 2022-03-03 DIAGNOSIS — J449 Chronic obstructive pulmonary disease, unspecified: Secondary | ICD-10-CM | POA: Diagnosis present

## 2022-03-03 DIAGNOSIS — E785 Hyperlipidemia, unspecified: Secondary | ICD-10-CM | POA: Diagnosis not present

## 2022-03-03 DIAGNOSIS — R531 Weakness: Secondary | ICD-10-CM | POA: Diagnosis not present

## 2022-03-03 DIAGNOSIS — D696 Thrombocytopenia, unspecified: Secondary | ICD-10-CM | POA: Diagnosis not present

## 2022-03-03 DIAGNOSIS — E871 Hypo-osmolality and hyponatremia: Secondary | ICD-10-CM | POA: Diagnosis not present

## 2022-03-03 DIAGNOSIS — I672 Cerebral atherosclerosis: Secondary | ICD-10-CM | POA: Diagnosis not present

## 2022-03-03 DIAGNOSIS — R2681 Unsteadiness on feet: Secondary | ICD-10-CM | POA: Diagnosis not present

## 2022-03-03 DIAGNOSIS — I6201 Nontraumatic acute subdural hemorrhage: Secondary | ICD-10-CM | POA: Diagnosis present

## 2022-03-03 DIAGNOSIS — E876 Hypokalemia: Secondary | ICD-10-CM | POA: Diagnosis not present

## 2022-03-03 DIAGNOSIS — Z4901 Encounter for fitting and adjustment of extracorporeal dialysis catheter: Secondary | ICD-10-CM | POA: Diagnosis not present

## 2022-03-03 DIAGNOSIS — K7682 Hepatic encephalopathy: Secondary | ICD-10-CM | POA: Diagnosis present

## 2022-03-03 DIAGNOSIS — I6200 Nontraumatic subdural hemorrhage, unspecified: Secondary | ICD-10-CM | POA: Diagnosis present

## 2022-03-03 DIAGNOSIS — I10 Essential (primary) hypertension: Secondary | ICD-10-CM | POA: Diagnosis not present

## 2022-03-03 DIAGNOSIS — R188 Other ascites: Secondary | ICD-10-CM | POA: Diagnosis present

## 2022-03-03 DIAGNOSIS — Z9989 Dependence on other enabling machines and devices: Secondary | ICD-10-CM | POA: Diagnosis not present

## 2022-03-03 DIAGNOSIS — N2581 Secondary hyperparathyroidism of renal origin: Secondary | ICD-10-CM | POA: Diagnosis present

## 2022-03-03 DIAGNOSIS — I132 Hypertensive heart and chronic kidney disease with heart failure and with stage 5 chronic kidney disease, or end stage renal disease: Secondary | ICD-10-CM | POA: Diagnosis present

## 2022-03-03 DIAGNOSIS — I480 Paroxysmal atrial fibrillation: Secondary | ICD-10-CM

## 2022-03-03 DIAGNOSIS — D649 Anemia, unspecified: Secondary | ICD-10-CM | POA: Diagnosis not present

## 2022-03-03 DIAGNOSIS — K7581 Nonalcoholic steatohepatitis (NASH): Secondary | ICD-10-CM | POA: Diagnosis present

## 2022-03-03 DIAGNOSIS — E1151 Type 2 diabetes mellitus with diabetic peripheral angiopathy without gangrene: Secondary | ICD-10-CM | POA: Diagnosis present

## 2022-03-03 DIAGNOSIS — Z515 Encounter for palliative care: Secondary | ICD-10-CM | POA: Diagnosis not present

## 2022-03-03 DIAGNOSIS — G9340 Encephalopathy, unspecified: Secondary | ICD-10-CM | POA: Diagnosis not present

## 2022-03-03 DIAGNOSIS — I482 Chronic atrial fibrillation, unspecified: Secondary | ICD-10-CM | POA: Diagnosis not present

## 2022-03-03 DIAGNOSIS — E7800 Pure hypercholesterolemia, unspecified: Secondary | ICD-10-CM | POA: Diagnosis present

## 2022-03-03 LAB — HEPATITIS B SURFACE ANTIGEN: Hepatitis B Surface Ag: NONREACTIVE

## 2022-03-03 LAB — AMMONIA: Ammonia: 63 umol/L — ABNORMAL HIGH (ref 9–35)

## 2022-03-03 MED ORDER — METOCLOPRAMIDE HCL 5 MG/ML IJ SOLN: 5.0000 mg | Freq: Four times a day (QID) | INTRAMUSCULAR | Status: DC | PRN

## 2022-03-03 MED ORDER — NITROGLYCERIN 0.4 MG SL SUBL: 0.4000 mg | SUBLINGUAL_TABLET | SUBLINGUAL | Status: DC | PRN

## 2022-03-03 MED ORDER — LABETALOL HCL 5 MG/ML IV SOLN: 20.0000 mg | INTRAVENOUS | Status: DC | PRN

## 2022-03-03 MED ORDER — ALTEPLASE 2 MG IJ SOLR: 2.0000 mg | Freq: Once | INTRAMUSCULAR | Status: DC | PRN

## 2022-03-03 MED ORDER — TORSEMIDE 20 MG PO TABS
80.0000 mg | ORAL_TABLET | Freq: Every day | ORAL | Status: DC
  Administered 2022-03-03 – 2022-03-08 (×6): 80 mg via ORAL
  Filled 2022-03-03 (×6): qty 4

## 2022-03-03 MED ORDER — CHLORHEXIDINE GLUCONATE CLOTH 2 % EX PADS
6.0000 | MEDICATED_PAD | Freq: Every day | CUTANEOUS | Status: DC
  Administered 2022-03-04 – 2022-03-08 (×5): 6 via TOPICAL
  Filled 2022-03-03: qty 6

## 2022-03-03 MED ORDER — LIDOCAINE HCL (PF) 1 % IJ SOLN: 5.0000 mL | INTRAMUSCULAR | Status: DC | PRN

## 2022-03-03 MED ORDER — ACETAMINOPHEN 325 MG PO TABS: 650.0000 mg | ORAL_TABLET | ORAL | Status: DC | PRN

## 2022-03-03 MED ORDER — HEPARIN SODIUM (PORCINE) 1000 UNIT/ML DIALYSIS: 1000.0000 [IU] | INTRAMUSCULAR | Status: DC | PRN

## 2022-03-03 MED ORDER — MIDODRINE HCL 5 MG PO TABS: 10.0000 mg | ORAL_TABLET | Freq: Three times a day (TID) | ORAL | Status: DC

## 2022-03-03 MED ORDER — PANTOPRAZOLE SODIUM 40 MG PO TBEC: 40.0000 mg | DELAYED_RELEASE_TABLET | Freq: Two times a day (BID) | ORAL | Status: DC

## 2022-03-03 MED ORDER — SENNOSIDES-DOCUSATE SODIUM 8.6-50 MG PO TABS
1.0000 | ORAL_TABLET | Freq: Two times a day (BID) | ORAL | Status: DC
  Administered 2022-03-03 – 2022-03-08 (×9): 1 via ORAL
  Filled 2022-03-03 (×10): qty 1

## 2022-03-03 MED ORDER — ACETAMINOPHEN 650 MG RE SUPP: 650.0000 mg | RECTAL | Status: DC | PRN

## 2022-03-03 MED ORDER — ADULT MULTIVITAMIN W/MINERALS CH
1.0000 | ORAL_TABLET | Freq: Every day | ORAL | Status: DC
  Administered 2022-03-03 – 2022-03-08 (×6): 1 via ORAL
  Filled 2022-03-03 (×6): qty 1

## 2022-03-03 MED ORDER — SODIUM CHLORIDE 0.9 % IV SOLN: INTRAVENOUS | Status: DC

## 2022-03-03 MED ORDER — RIFAXIMIN 550 MG PO TABS: 550.0000 mg | ORAL_TABLET | Freq: Two times a day (BID) | ORAL | Status: DC

## 2022-03-03 MED ORDER — LIDOCAINE-PRILOCAINE 2.5-2.5 % EX CREA: 1.0000 | TOPICAL_CREAM | CUTANEOUS | Status: DC | PRN

## 2022-03-03 MED ORDER — PANTOPRAZOLE SODIUM 40 MG IV SOLR
40.0000 mg | Freq: Every day | INTRAVENOUS | Status: DC
  Administered 2022-03-03 – 2022-03-04 (×3): 40 mg via INTRAVENOUS
  Filled 2022-03-03 (×3): qty 10

## 2022-03-03 MED ORDER — STROKE: EARLY STAGES OF RECOVERY BOOK: Freq: Once | Status: AC

## 2022-03-03 MED ORDER — LACTULOSE 10 GM/15ML PO SOLN
30.0000 g | Freq: Three times a day (TID) | ORAL | Status: DC
  Administered 2022-03-03 – 2022-03-08 (×13): 30 g via ORAL
  Filled 2022-03-03 (×13): qty 60

## 2022-03-03 MED ORDER — ACETAMINOPHEN 160 MG/5ML PO SOLN: 650.0000 mg | ORAL | Status: DC | PRN

## 2022-03-03 MED ORDER — ANTICOAGULANT SODIUM CITRATE 4% (200MG/5ML) IV SOLN
5.0000 mL | Status: DC | PRN
  Filled 2022-03-03 (×3): qty 5

## 2022-03-03 MED ORDER — DOCUSATE SODIUM 100 MG PO CAPS: 100.0000 mg | ORAL_CAPSULE | Freq: Two times a day (BID) | ORAL | Status: DC

## 2022-03-03 MED ORDER — MAGNESIUM HYDROXIDE 400 MG/5ML PO SUSP: 30.0000 mL | Freq: Every day | ORAL | Status: DC | PRN

## 2022-03-03 MED ORDER — AMIODARONE HCL 200 MG PO TABS
200.0000 mg | ORAL_TABLET | Freq: Every day | ORAL | Status: DC
  Administered 2022-03-03 – 2022-03-08 (×6): 200 mg via ORAL
  Filled 2022-03-03 (×6): qty 1

## 2022-03-03 MED ORDER — PENTAFLUOROPROP-TETRAFLUOROETH EX AERO: 1.0000 | INHALATION_SPRAY | CUTANEOUS | Status: DC | PRN

## 2022-03-03 NOTE — Progress Notes (Signed)
PT did 2.5 hrs of tx. Bp kept dropping, couldn't reach goal  UF = 300 ml    03/03/22 1316  Vitals  Temp 97.9 F (36.6 C)  Temp Source Oral  BP (!) 116/53  MAP (mmHg) 73  BP Location Left Arm  BP Method Automatic  Patient Position (if appropriate) Lying  Pulse Rate 98  Pulse Rate Source Monitor  ECG Heart Rate 98  Resp 20  Oxygen Therapy  SpO2 99 %  O2 Device Room Air  Patient Activity (if Appropriate) In bed  Post Treatment  Dialyzer Clearance Lightly streaked  Duration of HD Treatment -hour(s) 2.5 hour(s)  Hemodialysis Intake (mL) 0 mL  Liters Processed 60  Fluid Removed 300 mL  Tolerated HD Treatment Yes  Post-Hemodialysis Comments bp low, couldnt reach goal  Hemodialysis Catheter Right Internal jugular Double lumen Permanent (Tunneled)  Placement Date/Time: 12/15/21 1059   Time Out: Correct patient;Correct procedure;Correct site  Maximum sterile barrier precautions: Hand hygiene;Sterile gloves;Cap;Large sterile sheet;Mask;Sterile gown  Site Prep: Chlorhexidine (preferred)  Local Anes...  Site Condition No complications  Blue Lumen Status Flushed;Dead end cap in place  Red Lumen Status Flushed;Dead end cap in place  Catheter fill solution 4% Sodium Citrate  Catheter fill volume (Arterial) 2 cc  Catheter fill volume (Venous) 2.2  Dressing Type Transparent  Dressing Status Antimicrobial disc in place;Clean, Dry, Intact  Interventions New dressing  Drainage Description None  Dressing Change Due 03/10/22  Post treatment catheter status Capped and Clamped

## 2022-03-03 NOTE — H&P (Signed)
[place]   PATIENT NAME: April [name]    MR#:  [id number]  DATE OF BIRTH:  1940/09/06  DATE OF ADMISSION:  03/02/2022  PRIMARY CARE PHYSICIAN: Einar Pheasant, MD   Patient is coming from: Home  REQUESTING/REFERRING PHYSICIAN: Nance Pear, MD  CHIEF COMPLAINT:   Chief Complaint  Patient presents with   Hypotension    HISTORY OF PRESENT ILLNESS:  April [name] is a 81 y.o. Caucasian female with medical history significant for multiple medical problems that are mentioned below, who presented to the emergency room with acute onset of confusion and hypotension while having her hemodialysis session on Thursday.  Per her son she has been fairly confused since Monday but was able to finish hemodialysis on Tuesday.  Her confusion worsened since the night before dialysis.  She did not have any reported chest pain or dyspnea or cough or wheezing.  No fever or chills.  No nausea or vomiting or abdominal pain.  She has not been taking her lactulose regularly due to frequent bowel movements with it.  No headache or dizziness or blurred vision.  No paresthesias or focal muscle weakness.  No dysuria, oliguria or hematuria or flank pain.  ED Course: When she came to the ER, vital signs were within normal.  Labs revealed a BUN of 14 and creatinine 2.65 that is actually better than previous levels.  Albumin was 2.2 with total protein 5.6 and ammonia level 67 with AST of 93 and ALT 40 and total bili of 2.  Lactic acid was 4 and later 2.7. EKG as reviewed by me : Showed sinus rhythm with a rate of 96 with sinus arrhythmia and first-degree AV block and left bundle branch block Imaging: 2 view chest x-ray showed increased bilateral pleural effusions with associated atelectasis.  Initial noncontrasted CT scan revealed the following: Mixed density subdural hemorrhage along the left cerebral convexity and extending along the left tentorial leaflet, measuring up to 8 mm in thickness.  Intermixed hyperdense component is compatible with acute/recent hemorrhage. Trace resulting rightward midline shift.  Repeat noncontrasted CT scan 6 hours later revealed: 1. Stable 7 mm in thickness left frontotemporal convexity subdural hematoma and 3 mm in thickness left tentorial subdural bleed. No new or worsening hemorrhage. 2. 2 mm left-to-right midline shift of the septum pellucidum, no downward mass effect. 3. Atrophy and small-vessel disease.  The patient was given 20 g of p.o. lactulose and 300 mL lactulose enema, 10 mg of p.o. midodrine and 550 mg of p.o. rifaximin.  She will be admitted to a progressive unit bed for further evaluation and management. PAST MEDICAL HISTORY:   Past Medical History:  Diagnosis Date   Anemia    CAD S/P PCI pRCA Promus DES 3.5 x 16 (4.1 mm), ostRPDA Promus DES 2.5 x 12 (2.7 mm) 03/25/2016   CAD S/P percutaneous coronary angioplasty    a. 07/2015 MV: No ischemia, EF 66%;  b. 03/2016 Inflat STEMI/PCI: LM nl, LAD 25d, RI nl, LCX nl, OM1/2 nl, RCA 80p (3.5x16 Promus Premier DES), 50p/m, 30d, RPDA 90 (2.5x12 Promu Premier DES); c. 01/2018 Cath ([place], [place]): Patent RCA stents->Med Rx.   CHF (congestive heart failure) ([place])    CKD (chronic kidney disease), stage III ([place])    Diabetes mellitus without complication ([place])    Diastolic dysfunction    a. 10/2013 Echo: EF 55-65%, Gr1 DD; b. 01/2018 Echo ([place], [place]): EF 55-60%, Gr2 DD, RVSP 93mHg.  Diverticulitis    Dyspnea    Endometriosis    Family history of adverse reaction to anesthesia    Mother - had to stay in ICU because of breathing difficulties every time she had anesthesia   GERD (gastroesophageal reflux disease)    GI bleed    a. 06/2021 - melena-->2 u PRBCs.   Hiatal hernia    History of colon polyps 08/1994   History of Migraines    Hypercholesterolemia    Hypertension    LBBB (left bundle branch block)    Osteoporosis    PAF (paroxysmal atrial  fibrillation) ([place])    a. 03/2016 Dx @ time of MI; b. CHA2DS2VASc = 5-->Eliquis.   Rheumatic fever    Sleep apnea    CPAP   Spasmodic dysphonia    ST elevation myocardial infarction (STEMI) of inferolateral wall, initial episode of care ([place]) 03/25/2016   ST elevation myocardial infarction (STEMI) of inferolateral wall, initial episode of care ([place]) 03/25/2016   Urine incontinence     PAST SURGICAL HISTORY:   Past Surgical History:  Procedure Laterality Date   ABDOMINAL HYSTERECTOMY     ovaries not removed   APPENDECTOMY     was removed during hysterectomy   Breast biopsies     x2   BREAST EXCISIONAL BIOPSY Bilateral "years ago"   neg   BREAST SURGERY Bilateral    BROW LIFT Bilateral 08/15/2019   Procedure: BLEPHAROPLASTY UPPER EYELID; W/EXCESS SKIN BLEPHAROPTOSIS REPAIR; RESECT EX;  Surgeon: Karle Starch, MD;  Location: [facility];  Service: Ophthalmology;  Laterality: Bilateral;  sleep apnea   CARDIAC CATHETERIZATION  2011   moderate 40% RCA disease   CARDIAC CATHETERIZATION  01/2010   Dr. Gollan'@[facility]'$ : Only noted 40% RCA   CARDIAC CATHETERIZATION N/A 03/25/2016   Procedure: Left Heart Cath and Coronary Angiography;  Surgeon: 04/07/2016, MD;  Location: [facility];  Service: Cardiovascular;  Laterality: N/A;   CARDIAC CATHETERIZATION N/A 03/25/2016   Procedure: Coronary Stent Intervention;  Surgeon: 04/07/2016, MD;  Location: [facility];  Service: Cardiovascular;  Laterality: N/A;   COLONOSCOPY  2013   COLONOSCOPY WITH PROPOFOL N/A 07/11/2021   Procedure: COLONOSCOPY WITH PROPOFOL;  Surgeon: 07/24/2021, MD;  Location: [facility] ENDOSCOPY;  Service: Endoscopy;  Laterality: N/A;   DIALYSIS/PERMA CATHETER INSERTION N/A 12/15/2021   Procedure: DIALYSIS/PERMA CATHETER INSERTION;  Surgeon: 12/28/2021, MD;  Location: [facility];  Service: Cardiovascular;  Laterality: N/A;   ENTEROSCOPY N/A 02/11/2022   Procedure: ENTEROSCOPY;  Surgeon:  [place], 02/24/2022, MD;  Location: [facility] ENDOSCOPY;  Service: Gastroenterology;  Laterality: N/A;   ESOPHAGOGASTRODUODENOSCOPY (EGD) WITH PROPOFOL N/A 03/17/2015   Procedure: ESOPHAGOGASTRODUODENOSCOPY (EGD) WITH PROPOFOL;  Surgeon: 03/30/2015, MD;  Location: [facility] ENDOSCOPY;  Service: Gastroenterology;  Laterality: N/A;   ESOPHAGOGASTRODUODENOSCOPY (EGD) WITH PROPOFOL N/A 07/10/2021   Procedure: ESOPHAGOGASTRODUODENOSCOPY (EGD) WITH PROPOFOL;  Surgeon: 16/03/2022, MD;  Location[facility]COPY;  Service: Gastroenterology;  Laterality: N/A;   GIVENS CAPSULE STUDY N/A 07/11/2021   Procedure: GIVENS CAPSULE STUDY;  Surgeon: 07/24/2021, MD;  Location: [facility] ENDOSCOPY;  Service: Endoscopy;  Laterality: N/A;   NM MYOVIEW ([place] [facility])  07/2015   No evidence ischemia or infarction. EF 66%. Low risk   TEMPORARY DIALYSIS CATHETER N/A 12/08/2021   Procedure: TEMPORARY DIALYSIS CATHETER;  Surgeon: 21/01/2022, MD;  Location: [place];  Service: Cardiovascular;  Laterality: N/A;   TONSILLECTOMY  TRANSTHORACIC ECHOCARDIOGRAM  10/2013   Normal LV size and function. EF 55-65%. GR 1 DD. Otherwise normal.    SOCIAL HISTORY:   Social History   Tobacco Use   Smoking status: Never   Smokeless tobacco: Never  Substance Use Topics   Alcohol use: No    Alcohol/week: 0.0 standard drinks of alcohol    FAMILY HISTORY:   Family History  Problem Relation Age of Onset   Arthritis Mother    Stroke Mother    Hypertension Mother    Osteoporosis Mother    Heart disease Father        MI   Heart attack Father    Stroke Brother    Heart attack Sister    Breast cancer Other        first cousin x 2   Stroke Daughter    Heart attack Daughter    Colon cancer Neg Hx     DRUG ALLERGIES:   Allergies  Allergen Reactions   Penicillins Other (See Comments)    Okay to take amoxicillin/(pt does not recall what the reaction to penicillin was (37-34 years old)    Sulfa Antibiotics  Rash    REVIEW OF SYSTEMS:   ROS As per history of present illness. All pertinent systems were reviewed above. Constitutional, HEENT, cardiovascular, respiratory, GI, GU, musculoskeletal, neuro, psychiatric, endocrine, integumentary and hematologic systems were reviewed and are otherwise negative/unremarkable except for positive findings mentioned above in the HPI.   MEDICATIONS AT HOME:   Prior to Admission medications   Medication Sig Start Date End Date Taking? Authorizing Provider  amiodarone (PACERONE) 200 MG tablet Take 1 tablet (200 mg total) by mouth daily. 12/20/21  Yes Nolberto Hanlon, MD  docusate sodium (STOOL SOFTENER) 100 MG capsule Take 1 capsule (100 mg total) by mouth 2 (two) times daily. 12/19/21  Yes Nolberto Hanlon, MD  epoetin alfa (EPOGEN) 10000 UNIT/ML injection Inject 0.8 mLs (8,000 Units total) into the vein Every Tuesday,Thursday,and Saturday with dialysis. 02/14/22  Yes Enzo Bi, MD  hydrocortisone (ANUSOL-HC) 25 MG suppository Place 25 mg rectally 2 (two) times daily. 02/24/22 03/20/22 Yes [provider]  midodrine (PROAMATINE) 10 MG tablet Take 1 tablet (10 mg total) by mouth 3 (three) times daily with meals. 02/12/22 05/13/22 Yes Enzo Bi, MD  Multiple Vitamins-Minerals (MULTIVITAMIN WITH MINERALS) tablet Take 1 tablet by mouth daily.   Yes [provider]  pantoprazole (PROTONIX) 40 MG tablet Take 1 tablet (40 mg total) by mouth 2 (two) times daily before a meal. 02/12/22 05/13/22 Yes Enzo Bi, MD  rifaximin (XIFAXAN) 550 MG TABS tablet Take 1 tablet (550 mg total) by mouth 2 (two) times daily. 02/12/22 05/13/22 Yes Enzo Bi, MD  torsemide 40 MG TABS Take 80 mg by mouth daily. 02/12/22 05/13/22 Yes Enzo Bi, MD  acetaminophen (TYLENOL) 325 MG tablet Take 650 mg by mouth every 4 (four) hours as needed for mild pain or fever.    [provider]  ipratropium-albuterol (DUONEB) 0.5-2.5 (3) MG/3ML SOLN Take 3 mLs by nebulization every 6 (six)  hours as needed. Patient not taking: Reported on 02/20/2022 01/11/22   Shelly Coss, MD  nitroGLYCERIN (NITROSTAT) 0.4 MG SL tablet Place 1 tablet (0.4 mg total) under the tongue every 5 (five) minutes as needed for chest pain. 12/19/21   Nolberto Hanlon, MD      VITAL SIGNS:  Blood pressure (!) 132/51, pulse 89, temperature 97.8 F (36.6 C), temperature source Oral, resp. rate 17, last menstrual period 05/01/1972,  SpO2 95 %.  PHYSICAL EXAMINATION:  Physical Exam  GENERAL:  81 y.o.-year-old Caucasian female patient lying in the bed with no acute distress.  She was fairly somnolent but arousable EYES: Pupils equal, round, reactive to light and accommodation. No scleral icterus. Extraocular muscles intact.  HEENT: Head atraumatic, normocephalic. Oropharynx and nasopharynx clear.  NECK:  Supple, no jugular venous distention. No thyroid enlargement, no tenderness.  LUNGS: Normal breath sounds bilaterally, no wheezing, rales,rhonchi or crepitation. No use of accessory muscles of respiration.  CARDIOVASCULAR: Regular rate and rhythm, S1, S2 normal. No murmurs, rubs, or gallops.  ABDOMEN: Soft, nondistended, nontender. Bowel sounds present. No organomegaly or mass.  EXTREMITIES: No pedal edema, cyanosis, or clubbing.  NEUROLOGIC: Cranial nerves II through XII are intact. Muscle strength 5/5 in all extremities. Sensation intact. Gait not checked.  PSYCHIATRIC: The patient was somnolent but arousable.  She was alerted to her name and place and to the year but not to the month or the day.  Normal affect and good eye contact. SKIN: No obvious rash, lesion, or ulcer.   LABORATORY PANEL:   CBC Recent Labs  Lab 03/02/22 1426  WBC 21.2*  HGB 8.3*  HCT 25.2*  PLT 124*   ------------------------------------------------------------------------------------------------------------------  Chemistries  Recent Labs  Lab 03/02/22 1426  NA 138  K 4.6  CL 103  CO2 25  GLUCOSE 186*  BUN 14   CREATININE 2.65*  CALCIUM 8.2*  AST 93*  ALT 40  ALKPHOS 120  BILITOT 2.0*   ------------------------------------------------------------------------------------------------------------------  Cardiac Enzymes No results for input(s): "TROPONINI" in the last 168 hours. ------------------------------------------------------------------------------------------------------------------  RADIOLOGY:  CT HEAD WO CONTRAST (5MM)  Result Date: 03/03/2022 CLINICAL DATA:  Mental status change, subdural hematoma. 6 hour follow-up from yesterday. EXAM: CT HEAD WITHOUT CONTRAST TECHNIQUE: Contiguous axial images were obtained from the base of the skull through the vertex without intravenous contrast. RADIATION DOSE REDUCTION: This exam was performed according to the departmental dose-optimization program which includes automated exposure control, adjustment of the mA and/or kV according to patient size and/or use of iterative reconstruction technique. COMPARISON:  CT yesterday at 5:45 p.m. FINDINGS: Brain: Images of the current study were completed 12:06 a.m., 03/03/2022. Again noted is a 7 mm in thickness lateral left frontotemporal convexity subdural hematoma, and a 3 mm in thickness subdural bleed along the superior aspect of the left leaf of the tentorium. There has been no significant interval change. There is slight underlying left frontotemporal gyral crowding and no more than 2 mm of left-to-right midline shift of the septum pellucidum. There is no downward mass effect. There is mild cerebellar atrophy and mild-to-moderate cerebral atrophy and atrophic ventriculomegaly with mild small-vessel disease in the cerebral white matter. Basal cisterns are clear. No cortical based infarct, parenchymal bleed or mass is seen. Vascular: There are calcifications in the distal vertebral arteries and carotid siphons. No hyperdense central vessels. Skull: Negative for fractures or focal lesions. Sinuses/Orbits: No acute  finding. No mastoid effusion or new sinus disease. Small retention cyst left sphenoid air cell. Other: None. IMPRESSION: 1. Stable 7 mm in thickness left frontotemporal convexity subdural hematoma and 3 mm in thickness left tentorial subdural bleed. No new or worsening hemorrhage. 2. 2 mm left-to-right midline shift of the septum pellucidum, no downward mass effect. 3. Atrophy and small-vessel disease. Electronically Signed   By: Telford Nab M.D.   On: 03/03/2022 00:20   CT HEAD WO CONTRAST (5MM)  Result Date: 03/02/2022 CLINICAL DATA:  [place] EXAM: CT HEAD  WITHOUT CONTRAST TECHNIQUE: Contiguous axial images were obtained from the base of the skull through the vertex without intravenous contrast. RADIATION DOSE REDUCTION: This exam was performed according to the departmental dose-optimization program which includes automated exposure control, adjustment of the mA and/or kV according to patient size and/or use of iterative reconstruction technique. COMPARISON:  MRI head 04/26/2017. FINDINGS: Brain: Next density subdural hemorrhage along the left cerebral convexity, measuring up to 8 mm in thickness. Also, trace acute hemorrhage layering along the left tentorial leaflet. Trace resulting rightward midline shift. No evidence of acute large vascular territory infarct, mass lesion or hydrocephalus. Mild for age cerebral atrophy with ex vacuo ventricular dilation. Vascular: No hyperdense vessel identified. Skull: No acute fracture. Sinuses/Orbits: Largely clear sinuses.  No acute orbital findings. Other: No mastoid effusions. IMPRESSION: Mixed density subdural hemorrhage along the left cerebral convexity and extending along the left tentorial leaflet, measuring up to 8 mm in thickness. Intermixed hyperdense component is compatible with acute/recent hemorrhage. Trace resulting rightward midline shift. Findings discussed with Dr. Ellender Hose via telephone at 6:22 p.m. Electronically Signed   By: Margaretha Sheffield M.D.    On: 03/02/2022 18:23   DG Chest 2 View  Result Date: 03/02/2022 CLINICAL DATA:  Weakness. EXAM: CHEST - 2 VIEW COMPARISON:  February 01, 2022. FINDINGS: Stable cardiomediastinal silhouette. Right internal jugular catheter is unchanged in position. Mild bilateral pleural effusions are noted with associated atelectasis which is increased compared to prior exam. Stable hiatal hernia. Bony thorax is unremarkable. IMPRESSION: Increased bilateral pleural effusions are noted with associated atelectasis. Electronically Signed   By: Marijo Conception M.D.   On: 03/02/2022 16:40      IMPRESSION AND PLAN:  Assessment and Plan: * Subdural hemorrhage ([place]) - The patient will be admitted to a progressive unit bed. - She had a repeat head CT scan that revealed stability of her left-sided subdural hematoma. - Neurochecks will be obtained. - Neurosurgery consult will be obtained. - Dr. Izora Ribas was notified about the patient.  Acute hepatic encephalopathy (HCC) - This is likely the culprit for her altered mental status and confusion rather than her subdural hematoma. - It is likely due to noncompliance with lactulose. - She will be placed on lactulose and rifaximin.  GERD without esophagitis - We will continue PPI therapy.  End-stage renal disease on hemodialysis [facility]) - Nephrology consult to be obtained. - Dr. Holley Raring was notified about the patient.  Paroxysmal atrial fibrillation (HCC) - We will continue amiodarone.       DVT prophylaxis: SCDs. Advanced Care Planning:  Code Status: She is DNR/DNI.  This was discussed with her and her son. Family Communication:  The plan of care was discussed in details with the patient (and family). I answered all questions. The patient agreed to proceed with the above mentioned plan. Further management will depend upon hospital course. Disposition Plan: Back to previous home environment Consults called: Neurosurgery All the records are reviewed and case  discussed with ED provider.  Status is: Inpatient   At the time of the admission, it appears that the appropriate admission status for this patient is inpatient.  This is judged to be reasonable and necessary in order to provide the required intensity of service to ensure the patient's safety given the presenting symptoms, physical exam findings and initial radiographic and laboratory data in the context of comorbid conditions.  The patient requires inpatient status due to high intensity of service, high risk of further deterioration and high frequency of surveillance  required.  I certify that at the time of admission, it is my clinical judgment that the patient will require inpatient hospital care extending more than 2 midnights.                            Dispo: The patient is from: Home              Anticipated d/c is to: Home              Patient currently is not medically stable to d/c.              Difficult to place patient: No  Christel Mormon M.D on 03/03/2022 at 7:51 AM  [facility]   From 7 PM-7 AM, contact night-coverage [email or web address]  CC: Primary care physician; Einar Pheasant, MD

## 2022-03-03 NOTE — Progress Notes (Signed)
   Follow Up Note  HPI: Patient is an 81 year old female with past medical history of end-stage renal disease, CAD status post angioplasty, diabetes mellitus, paroxysmal atrial fibrillation on Eliquis, sleep apnea, diastolic heart failure and [name] cirrhosis who has been noncompliant with her lactulose and presented to the emergency room on 11/2 with confusion.  During work-up, patient also found to have a left-sided subdural hematoma with small left to right midline shift.  Repeat scan on the early morning of 11/3 noted no change.  Patient was admitted to the hospitalist service and neurosurgery consulted.  Neurosurgery felt given patient's major medical comorbidities, not candidate for surgical intervention.  It was also felt that subdural hematoma was slightly subacute and predated change in mental status.  Patient noted to have elevated ammonia levels which were more likely felt to be because of mentation and GI consulted.  Pt admitted earlier this morning and seen in dialysis.  Exam: Mentation: A bit more interactive and appropriate although still confused Cardiovascular: Irregular rhythm, rate controlled  Principal Problem:   Subdural hemorrhage ([place]) Active Problems:   Acute hepatic encephalopathy (HCC)   Paroxysmal atrial fibrillation (HCC)   End-stage renal disease on hemodialysis ([place])   GERD without esophagitis  Cardiovascular and Mediastinum Paroxysmal atrial fibrillation (HCC) Assessment & Plan - We will continue amiodarone.  Digestive GERD without esophagitis Assessment & Plan - We will continue PPI therapy.  Nervous and Auditory Acute hepatic encephalopathy [facility]) Assessment & Plan - This is likely the culprit for her altered mental status and confusion rather than her subdural hematoma. - It is likely due to noncompliance with lactulose.  Lactulose restarted as well as rifaximin.  Patient mentation improving.  * Subdural hemorrhage [facility]) Assessment & Plan Appreciate  neurosurgery consultation.  At this time, no intervention planned and given comorbidities will not be done.  More of an incidental finding and not related to her confusion.  While there is no history of fall, this is certainly a contributing factor and likely what happened to cause this issue.   Genitourinary End-stage renal disease on hemodialysis [facility]) Assessment & Plan Nephrology consulted.  Patient had dialysis on Tuesday Thursday Saturday.  Previous dialysis session on 11/2 terminated early so patient received brief dialysis today and will keep track with regular scheduled dialysis tomorrow.  Consultants: -Neurosurgery -Gastroenterology -Nephrology

## 2022-03-03 NOTE — Consult Note (Signed)
Cephas Darby, MD [address]  [place]  [place], [place] [address]  Main: [phone]  Fax: [phone] Pager: [phone]   Consultation  Referring Provider:     No ref. provider found Primary Care Physician:  Einar Pheasant, MD Primary Gastroenterologist:  Dr. Alice Reichert        Reason for Consultation: [place] mental status  Date of Admission:  03/02/2022 Date of Consultation:  03/03/2022         HPI:   April [name] is a 81 y.o. female history of decompensated cirrhosis of liver secondary to [facility] with encephalopathy, ascites, end-stage renal disease on hemodialysis is admitted with to mental status.  She was also hypotensive during her hemodialysis session on Thursday.  Patient has been noticed to be confused since Monday, able to finish dialysis on Tuesday.  Apparently, patient was supposed to take lactulose at home on a regular basis, but she has not been taking it because of frequent bowel movements.  She is taking rifaximin 50 mg twice daily.  Patient underwent CT head in the ER which revealed subdural hemorrhage along the left cerebral convexity extending into the left tentorial leaflet resulting in rightward midline shift.  When I interviewed the patient, patient's daughter is also bedside.  Patient is able to answer majority of the questions and she is able to comprehend.  Patient's nurse is also bedside.  Patient denies any abdominal pain.  She has been requiring paracentesis regularly and it has been small volume up to 2 L per each session only.  Patient denies any abdominal pain.  She does have history of extensive small bowel AVMs with intermittent melena.  Her hemoglobin has been stable.  Patient denies any fever, chills, nausea or vomiting, abdominal pain.   NSAIDs: None  Antiplts/Anticoagulants/Anti thrombotics: None  GI Procedures:  Small bowel endoscopy 03/14/2022 AVMs in duodenum, stomach and jejunum were treated with APC  Colonoscopy 07/11/2021 - Three  2 to 4 mm polyps in the transverse colon, removed with a cold biopsy forceps. Resected and retrieved. - One 5 mm polyp in the descending colon, removed with a cold biopsy forceps. Resected and retrieved. Clip (MR conditional) was placed. - Diverticulosis in the sigmoid colon. - Non-bleeding internal hemorrhoids. - The examined portion of the ileum was normal.  Upper endoscopy 07/10/2021 - Normal duodenal bulb and second portion of the duodenum. - Erythematous mucosa in the antrum and prepyloric region of the stomach. Biopsied. - Medium-sized hiatal hernia. - Normal gastroesophageal junction and esophagus.    Past Medical History:  Diagnosis Date   Anemia    CAD S/P PCI pRCA Promus DES 3.5 x 16 (4.1 mm), ostRPDA Promus DES 2.5 x 12 (2.7 mm) 03/25/2016   CAD S/P percutaneous coronary angioplasty    a. 07/2015 MV: No ischemia, EF 66%;  b. 03/2016 Inflat STEMI/PCI: LM nl, LAD 25d, RI nl, LCX nl, OM1/2 nl, RCA 80p (3.5x16 Promus Premier DES), 50p/m, 30d, RPDA 90 (2.5x12 Promu Premier DES); c. 01/2018 Cath ([place], [place]): Patent RCA stents->Med Rx.   CHF (congestive heart failure) ([place])    CKD (chronic kidney disease), stage III ([place])    Diabetes mellitus without complication ([place])    Diastolic dysfunction    a. 10/2013 Echo: EF 55-65%, Gr1 DD; b. 01/2018 Echo ([place], [place]): EF 55-60%, Gr2 DD, RVSP 62mHg.   Diverticulitis    Dyspnea    Endometriosis    Family history of adverse reaction to  anesthesia    Mother - had to stay in ICU because of breathing difficulties every time she had anesthesia   GERD (gastroesophageal reflux disease)    GI bleed    a. 06/2021 - melena-->2 u PRBCs.   Hiatal hernia    History of colon polyps 08/1994   History of Migraines    Hypercholesterolemia    Hypertension    LBBB (left bundle branch block)    Osteoporosis    PAF (paroxysmal atrial fibrillation) ([place])    a. 03/2016 Dx @ time of MI; b. CHA2DS2VASc = 5-->Eliquis.    Rheumatic fever    Sleep apnea    CPAP   Spasmodic dysphonia    ST elevation myocardial infarction (STEMI) of inferolateral wall, initial episode of care ([place]) 03/25/2016   ST elevation myocardial infarction (STEMI) of inferolateral wall, initial episode of care [facility]) 03/25/2016   Urine incontinence     Past Surgical History:  Procedure Laterality Date   ABDOMINAL HYSTERECTOMY     ovaries not removed   APPENDECTOMY     was removed during hysterectomy   Breast biopsies     x2   BREAST EXCISIONAL BIOPSY Bilateral "years ago"   neg   BREAST SURGERY Bilateral    BROW LIFT Bilateral 08/15/2019   Procedure: BLEPHAROPLASTY UPPER EYELID; W/EXCESS SKIN BLEPHAROPTOSIS REPAIR; RESECT EX;  Surgeon: Karle Starch, MD;  Location: [place];  Service: Ophthalmology;  Laterality: Bilateral;  sleep apnea   CARDIAC CATHETERIZATION  2011   moderate 40% RCA disease   CARDIAC CATHETERIZATION  01/2010   Dr. Suzie Portela : Only noted 40% RCA   CARDIAC CATHETERIZATION N/A 03/25/2016   Procedure: Left Heart Cath and Coronary Angiography;  Surgeon: Leonie Man, MD;  Location: [place] CV LAB;  Service: Cardiovascular;  Laterality: N/A;   CARDIAC CATHETERIZATION N/A 03/25/2016   Procedure: Coronary Stent Intervention;  Surgeon: Leonie Man, MD;  Location: [place] CV LAB;  Service: Cardiovascular;  Laterality: N/A;   COLONOSCOPY  2013   COLONOSCOPY WITH PROPOFOL N/A 07/11/2021   Procedure: COLONOSCOPY WITH PROPOFOL;  Surgeon: Lucilla Lame, MD;  Location: [facility] ENDOSCOPY;  Service: Endoscopy;  Laterality: N/A;   DIALYSIS/PERMA CATHETER INSERTION N/A 12/15/2021   Procedure: DIALYSIS/PERMA CATHETER INSERTION;  Surgeon: Algernon Huxley, MD;  Location: [place] CV LAB;  Service: Cardiovascular;  Laterality: N/A;   ENTEROSCOPY N/A 02/11/2022   Procedure: ENTEROSCOPY;  Surgeon: Toledo, Benay Pike, MD;  Location: [facility] ENDOSCOPY;  Service: Gastroenterology;  Laterality: N/A;    ESOPHAGOGASTRODUODENOSCOPY (EGD) WITH PROPOFOL N/A 03/17/2015   Procedure: ESOPHAGOGASTRODUODENOSCOPY (EGD) WITH PROPOFOL;  Surgeon: Christene Lye, MD;  Location: [facility] ENDOSCOPY;  Service: Gastroenterology;  Laterality: N/A;   ESOPHAGOGASTRODUODENOSCOPY (EGD) WITH PROPOFOL N/A 07/10/2021   Procedure: ESOPHAGOGASTRODUODENOSCOPY (EGD) WITH PROPOFOL;  Surgeon: Lin Landsman, MD;  Location: [facility] ENDOSCOPY;  Service: Gastroenterology;  Laterality: N/A;   GIVENS CAPSULE STUDY N/A 07/11/2021   Procedure: GIVENS CAPSULE STUDY;  Surgeon: Lucilla Lame, MD;  Location: [facility] ENDOSCOPY;  Service: Endoscopy;  Laterality: N/A;   NM MYOVIEW ([place] [facility])  07/2015   No evidence ischemia or infarction. EF 66%. Low risk   TEMPORARY DIALYSIS CATHETER N/A 12/08/2021   Procedure: TEMPORARY DIALYSIS CATHETER;  Surgeon: Algernon Huxley, MD;  Location: [place] CV LAB;  Service: Cardiovascular;  Laterality: N/A;   TONSILLECTOMY     TRANSTHORACIC ECHOCARDIOGRAM  10/2013   Normal LV size and function. EF 55-65%. GR 1 DD. Otherwise normal.     Current  Facility-Administered Medications:    [START ON 03/04/2022]  stroke: early stages of recovery book, , Does not apply, Once, Mansy, Jan A, MD   0.9 %  sodium chloride infusion, , Intravenous, Continuous, Mansy, Jan A, MD   acetaminophen (TYLENOL) tablet 650 mg, 650 mg, Oral, Q4H PRN **OR** acetaminophen (TYLENOL) 160 MG/5ML solution 650 mg, 650 mg, Per Tube, Q4H PRN **OR** acetaminophen (TYLENOL) suppository 650 mg, 650 mg, Rectal, Q4H PRN, Mansy, Jan A, MD   alteplase (CATHFLO ACTIVASE) injection 2 mg, 2 mg, Intracatheter, Once PRN, Colon Flattery, NP   amiodarone (PACERONE) tablet 200 mg, 200 mg, Oral, Daily, Mansy, Jan A, MD, 200 mg at 03/03/22 1528   anticoagulant sodium citrate solution 5 mL, 5 mL, Intracatheter, PRN, Colon Flattery, NP   Chlorhexidine Gluconate Cloth 2 % PADS 6 each, 6 each, Topical, Q0600, Breeze, Shantelle, NP   heparin injection 1,000  Units, 1,000 Units, Intracatheter, PRN, Colon Flattery, NP   labetalol (NORMODYNE) injection 20 mg, 20 mg, Intravenous, Q3H PRN, Mansy, Jan A, MD   lactulose (CHRONULAC) 10 GM/15ML solution 30 g, 30 g, Oral, TID, Mansy, Jan A, MD, 30 g at 03/03/22 1534   lidocaine (PF) (XYLOCAINE) 1 % injection 5 mL, 5 mL, Intradermal, PRN, Breeze, Shantelle, NP   lidocaine-prilocaine (EMLA) cream 1 Application, 1 Application, Topical, PRN, Gwyneth Revels, Shantelle, NP   magnesium hydroxide (MILK OF MAGNESIA) suspension 30 mL, 30 mL, Oral, Daily PRN, Mansy, Jan A, MD   metoCLOPramide (REGLAN) injection 5 mg, 5 mg, Intravenous, Q6H PRN, Mansy, Jan A, MD   midodrine (PROAMATINE) tablet 10 mg, 10 mg, Oral, TID WC, Duffy Bruce, MD, 10 mg at 03/03/22 1532   multivitamin with minerals tablet 1 tablet, 1 tablet, Oral, Daily, Mansy, Jan A, MD, 1 tablet at 03/03/22 1528   nitroGLYCERIN (NITROSTAT) SL tablet 0.4 mg, 0.4 mg, Sublingual, Q5 min PRN, Mansy, Jan A, MD   pantoprazole (PROTONIX) injection 40 mg, 40 mg, Intravenous, QHS, Mansy, Jan A, MD, 40 mg at 03/03/22 0309   pentafluoroprop-tetrafluoroeth (GEBAUERS) aerosol 1 Application, 1 Application, Topical, PRN, Colon Flattery, NP   rifaximin (XIFAXAN) tablet 550 mg, 550 mg, Oral, BID, Duffy Bruce, MD, 550 mg at 03/03/22 1607   senna-docusate (Senokot-S) tablet 1 tablet, 1 tablet, Oral, BID, Mansy, Jan A, MD, 1 tablet at 03/03/22 1532   torsemide (DEMADEX) tablet 80 mg, 80 mg, Oral, Daily, Mansy, Jan A, MD, 80 mg at 03/03/22 1528  Current Outpatient Medications:    amiodarone (PACERONE) 200 MG tablet, Take 1 tablet (200 mg total) by mouth daily., Disp: , Rfl:    docusate sodium (STOOL SOFTENER) 100 MG capsule, Take 1 capsule (100 mg total) by mouth 2 (two) times daily., Disp: 10 capsule, Rfl: 0   epoetin alfa (EPOGEN) 10000 UNIT/ML injection, Inject 0.8 mLs (8,000 Units total) into the vein Every Tuesday,Thursday,and Saturday with dialysis., Disp: 1 mL, Rfl:     hydrocortisone (ANUSOL-HC) 25 MG suppository, Place 25 mg rectally 2 (two) times daily., Disp: , Rfl:    midodrine (PROAMATINE) 10 MG tablet, Take 1 tablet (10 mg total) by mouth 3 (three) times daily with meals., Disp: 90 tablet, Rfl: 2   Multiple Vitamins-Minerals (MULTIVITAMIN WITH MINERALS) tablet, Take 1 tablet by mouth daily., Disp: , Rfl:    pantoprazole (PROTONIX) 40 MG tablet, Take 1 tablet (40 mg total) by mouth 2 (two) times daily before a meal., Disp: 60 tablet, Rfl: 2   rifaximin (XIFAXAN) 550 MG TABS tablet, Take 1 tablet (550 mg  total) by mouth 2 (two) times daily., Disp: 60 tablet, Rfl: 2   torsemide 40 MG TABS, Take 80 mg by mouth daily., Disp: 60 tablet, Rfl: 2   acetaminophen (TYLENOL) 325 MG tablet, Take 650 mg by mouth every 4 (four) hours as needed for mild pain or fever., Disp: , Rfl:    ipratropium-albuterol (DUONEB) 0.5-2.5 (3) MG/3ML SOLN, Take 3 mLs by nebulization every 6 (six) hours as needed. (Patient not taking: Reported on 02/20/2022), Disp: 360 mL, Rfl: 0   nitroGLYCERIN (NITROSTAT) 0.4 MG SL tablet, Place 1 tablet (0.4 mg total) under the tongue every 5 (five) minutes as needed for chest pain., Disp: , Rfl: 12   Family History  Problem Relation Age of Onset   Arthritis Mother    Stroke Mother    Hypertension Mother    Osteoporosis Mother    Heart disease Father        MI   Heart attack Father    Stroke Brother    Heart attack Sister    Breast cancer Other        first cousin x 2   Stroke Daughter    Heart attack Daughter    Colon cancer Neg Hx      Social History   Tobacco Use   Smoking status: Never   Smokeless tobacco: Never  Vaping Use   Vaping Use: Never used  Substance Use Topics   Alcohol use: No    Alcohol/week: 0.0 standard drinks of alcohol   Drug use: No    Allergies as of 03/02/2022 - Review Complete 03/02/2022  Allergen Reaction Noted   Penicillins Other (See Comments) 10/11/2012   Sulfa antibiotics Rash 07/08/2012     Review of Systems:    All systems reviewed and negative except where noted in HPI.   Physical Exam:  Vital signs in last 24 hours: Temp:  [97.4 F (36.3 C)-98 F (36.7 C)] 97.9 F (36.6 C) (11/03 1316) Pulse Rate:  [79-103] 94 (11/03 1500) Resp:  [13-23] 20 (11/03 1500) BP: (74-133)/(42-85) 129/54 (11/03 1500) SpO2:  [95 %-99 %] 97 % (11/03 1500)   General:   Pleasant, cooperative in NAD Head:  Normocephalic and atraumatic. Eyes:   No icterus.   Conjunctiva pink. PERRLA. Ears:  Normal auditory acuity. Neck:  Supple; no masses or thyroidomegaly Lungs: Respirations even and unlabored. Lungs clear to auscultation bilaterally.   No wheezes, crackles, or rhonchi.  Heart:  Regular rate and rhythm;  Without murmur, clicks, rubs or gallops Abdomen:  Soft, mildly distended, nontender. Normal bowel sounds. No appreciable masses or hepatomegaly.  No rebound or guarding.  Rectal:  Not performed. Msk:  Symmetrical without gross deformities.  Strength generalized weakness Extremities:  Without edema, cyanosis or clubbing. Neurologic:  Alert and oriented x2;  grossly normal neurologically. Skin:  Intact without significant lesions or rashes. Psych:  Alert and cooperative. Normal affect.  LAB RESULTS:    Latest Ref Rng & Units 03/02/2022    2:26 PM 02/20/2022   12:06 PM 02/12/2022    6:04 AM  CBC  WBC 4.0 - 10.5 K/uL 21.2  16.0  30.3   Hemoglobin 12.0 - 15.0 g/dL 8.3  8.7 Repeated and verified X2.  7.5   Hematocrit 36.0 - 46.0 % 25.2  27.3  22.4   Platelets 150 - 400 K/uL 124  124.0  105     BMET    Latest Ref Rng & Units 03/02/2022    2:26 PM 02/12/2022  6:04 AM 02/11/2022    4:19 AM  BMP  Glucose 70 - 99 mg/dL 186  139  92   BUN 8 - 23 mg/dL 14  25  47   Creatinine 0.44 - 1.00 mg/dL 2.65  2.93  4.15   Sodium 135 - 145 mmol/L 138  133  132   Potassium 3.5 - 5.1 mmol/L 4.6  4.0  3.5   Chloride 98 - 111 mmol/L 103  98  99   CO2 22 - 32 mmol/L _0 Calcium 8.9 -  10.3 mg/dL 8.2  7.8  8.1     LFT    Latest Ref Rng & Units 03/02/2022    2:26 PM 02/07/2022    7:57 AM 02/06/2022    4:15 AM  Hepatic Function  Total Protein 6.5 - 8.1 g/dL 5.6     Albumin 3.5 - 5.0 g/dL 2.2  2.3  1.9   AST 15 - 41 U/L 93     ALT 0 - 44 U/L 40     Alk Phosphatase 38 - 126 U/L 120     Total Bilirubin 0.3 - 1.2 mg/dL 2.0        STUDIES: CT HEAD WO CONTRAST (5MM)  Result Date: 03/03/2022 CLINICAL DATA:  Mental status change, subdural hematoma. 6 hour follow-up from yesterday. EXAM: CT HEAD WITHOUT CONTRAST TECHNIQUE: Contiguous axial images were obtained from the base of the skull through the vertex without intravenous contrast. RADIATION DOSE REDUCTION: This exam was performed according to the departmental dose-optimization program which includes automated exposure control, adjustment of the mA and/or kV according to patient size and/or use of iterative reconstruction technique. COMPARISON:  CT yesterday at 5:45 p.m. FINDINGS: Brain: Images of the current study were completed 12:06 a.m., 03/03/2022. Again noted is a 7 mm in thickness lateral left frontotemporal convexity subdural hematoma, and a 3 mm in thickness subdural bleed along the superior aspect of the left leaf of the tentorium. There has been no significant interval change. There is slight underlying left frontotemporal gyral crowding and no more than 2 mm of left-to-right midline shift of the septum pellucidum. There is no downward mass effect. There is mild cerebellar atrophy and mild-to-moderate cerebral atrophy and atrophic ventriculomegaly with mild small-vessel disease in the cerebral white matter. Basal cisterns are clear. No cortical based infarct, parenchymal bleed or mass is seen. Vascular: There are calcifications in the distal vertebral arteries and carotid siphons. No hyperdense central vessels. Skull: Negative for fractures or focal lesions. Sinuses/Orbits: No acute finding. No mastoid effusion or new sinus  disease. Small retention cyst left sphenoid air cell. Other: None. IMPRESSION: 1. Stable 7 mm in thickness left frontotemporal convexity subdural hematoma and 3 mm in thickness left tentorial subdural bleed. No new or worsening hemorrhage. 2. 2 mm left-to-right midline shift of the septum pellucidum, no downward mass effect. 3. Atrophy and small-vessel disease. Electronically Signed   By: Telford Nab M.D.   On: 03/03/2022 00:20   CT HEAD WO CONTRAST (5MM)  Result Date: 03/02/2022 CLINICAL DATA:  [place] EXAM: CT HEAD WITHOUT CONTRAST TECHNIQUE: Contiguous axial images were obtained from the base of the skull through the vertex without intravenous contrast. RADIATION DOSE REDUCTION: This exam was performed according to the departmental dose-optimization program which includes automated exposure control, adjustment of the mA and/or kV according to patient size and/or use of iterative reconstruction technique. COMPARISON:  MRI head 04/26/2017. FINDINGS: Brain: Next density subdural hemorrhage along the left  cerebral convexity, measuring up to 8 mm in thickness. Also, trace acute hemorrhage layering along the left tentorial leaflet. Trace resulting rightward midline shift. No evidence of acute large vascular territory infarct, mass lesion or hydrocephalus. Mild for age cerebral atrophy with ex vacuo ventricular dilation. Vascular: No hyperdense vessel identified. Skull: No acute fracture. Sinuses/Orbits: Largely clear sinuses.  No acute orbital findings. Other: No mastoid effusions. IMPRESSION: Mixed density subdural hemorrhage along the left cerebral convexity and extending along the left tentorial leaflet, measuring up to 8 mm in thickness. Intermixed hyperdense component is compatible with acute/recent hemorrhage. Trace resulting rightward midline shift. Findings discussed with Dr. Ellender Hose via telephone at 6:22 p.m. Electronically Signed   By: Margaretha Sheffield M.D.   On: 03/02/2022 18:23   DG Chest 2  View  Result Date: 03/02/2022 CLINICAL DATA:  Weakness. EXAM: CHEST - 2 VIEW COMPARISON:  February 01, 2022. FINDINGS: Stable cardiomediastinal silhouette. Right internal jugular catheter is unchanged in position. Mild bilateral pleural effusions are noted with associated atelectasis which is increased compared to prior exam. Stable hiatal hernia. Bony thorax is unremarkable. IMPRESSION: Increased bilateral pleural effusions are noted with associated atelectasis. Electronically Signed   By: Marijo Conception M.D.   On: 03/02/2022 16:40      Impression / Plan:   April [name] is a 80 y.o. female with decompensated cirrhosis of liver with ascites, hepatic encephalopathy, end-stage renal disease on hemodialysis is admitted with acute altered mental status, found to have subacute subdural subdural hematoma over the left frontal and temporal lobes with small amount of left-to-right midline shift.  Neurosurgery is recommending against surgical intervention of her subdural hematoma at this time given her major medical comorbidities Recommend to continue lactulose and rifaximin for underlying hepatic encephalopathy Urine analysis is negative for infection Recommend paracentesis as needed, does not need at this time No further recommendations from GI standpoint at this time Follow-up with Dr. Alice Reichert as outpatient  Thank you for involving me in the care of this patient.  Please call us back with questions or concerns    LOS: 0 days   Sherri Sear, MD  03/03/2022, 4:59 PM    Note: This dictation was prepared with Dragon dictation along with smaller phrase technology. Any transcriptional errors that result from this process are unintentional.

## 2022-03-03 NOTE — ED Notes (Signed)
Request made for transport to the floor ?

## 2022-03-03 NOTE — Evaluation (Addendum)
Clinical/Bedside Swallow Evaluation Patient Details  Name: April [name] MRN: [id number] Date of Birth: [birth year]  Today's Date: 03/03/2022 Time: SLP Start Time (ACUTE ONLY): 72 SLP Stop Time (ACUTE ONLY): 1330 SLP Time Calculation (min) (ACUTE ONLY): 50 min  Past Medical History:  Past Medical History:  Diagnosis Date   Anemia    CAD S/P PCI pRCA Promus DES 3.5 x 16 (4.1 mm), ostRPDA Promus DES 2.5 x 12 (2.7 mm) 03/25/2016   CAD S/P percutaneous coronary angioplasty    a. 07/2015 MV: No ischemia, EF 66%;  b. 03/2016 Inflat STEMI/PCI: LM nl, LAD 25d, RI nl, LCX nl, OM1/2 nl, RCA 80p (3.5x16 Promus Premier DES), 50p/m, 30d, RPDA 90 (2.5x12 Promu Premier DES); c. 01/2018 Cath ([place], [place]): Patent RCA stents->Med Rx.   CHF (congestive heart failure) ([place])    CKD (chronic kidney disease), stage III ([place])    Diabetes mellitus without complication ([place])    Diastolic dysfunction    a. 10/2013 Echo: EF 55-65%, Gr1 DD; b. 01/2018 Echo ([place], [place]): EF 55-60%, Gr2 DD, RVSP 90mHg.   Diverticulitis    Dyspnea    Endometriosis    Family history of adverse reaction to anesthesia    Mother - had to stay in ICU because of breathing difficulties every time she had anesthesia   GERD (gastroesophageal reflux disease)    GI bleed    a. 06/2021 - melena-->2 u PRBCs.   Hiatal hernia    History of colon polyps 08/1994   History of Migraines    Hypercholesterolemia    Hypertension    LBBB (left bundle branch block)    Osteoporosis    PAF (paroxysmal atrial fibrillation) ([facility] [facility]    a. 03/2016 Dx @ time of MI; b. CHA2DS2VASc = 5-->Eliquis.   Rheumatic fever    Sleep apnea    CPAP   Spasmodic dysphonia    ST elevation myocardial infarction (STEMI) of inferolateral wall, initial episode of care ([facility] 03/25/2016   ST elevation myocardial infarction (STEMI) of inferolateral wall, initial episode of care ([facility] 03/25/2016   Urine incontinence    Past Surgical History:   Past Surgical History:  Procedure Laterality Date   ABDOMINAL HYSTERECTOMY     ovaries not removed   APPENDECTOMY     was removed during hysterectomy   Breast biopsies     x2   BREAST EXCISIONAL BIOPSY Bilateral "years ago"   neg   BREAST SURGERY Bilateral    BROW LIFT Bilateral 08/15/2019   Procedure: BLEPHAROPLASTY UPPER EYELID; W/EXCESS SKIN BLEPHAROPTOSIS REPAIR; RESECT EX;  Surgeon: FKarle Starch MD;  [facility]  Service: Ophthalmology;  Laterality: Bilateral;  sleep apnea   CARDIAC CATHETERIZATION  2011   moderate 40% RCA disease   CARDIAC CATHETERIZATION  01/2010   Dr. Gollan'@[facility]'$ : Only noted 40% RCA   CARDIAC CATHETERIZATION N/A 03/25/2016   Procedure: Left Heart Cath and Coronary Angiography;  Surgeon: D12/11/2015 MD;  [facility];  Service: Cardiovascular;  Laterality: N/A;   CARDIAC CATHETERIZATION N/A 03/25/2016   Procedure: Coronary Stent Intervention;  Surgeon: D12/11/2015 MD;  [facility];  Service: Cardiovascular;  Laterality: N/A;   COLONOSCOPY  2013   COLONOSCOPY WITH PROPOFOL N/A 07/11/2021   Procedure: COLONOSCOPY WITH PROPOFOL;  Surgeon: W3/26/2023 MD;  [facility];  Service: Endoscopy;  Laterality: N/A;   DIALYSIS/PERMA CATHETER INSERTION N/A 12/15/2021   Procedure: DIALYSIS/PERMA CATHETER INSERTION;  Surgeon: Algernon Huxley, MD;  Location: [place] [facility];  Service: Cardiovascular;  Laterality: N/A;   ENTEROSCOPY N/A 02/11/2022   Procedure: ENTEROSCOPY;  Surgeon: Toledo, Benay Pike, MD;  Location: [facility] ENDOSCOPY;  Service: Gastroenterology;  Laterality: N/A;   ESOPHAGOGASTRODUODENOSCOPY (EGD) WITH PROPOFOL N/A 03/17/2015   Procedure: ESOPHAGOGASTRODUODENOSCOPY (EGD) WITH PROPOFOL;  Surgeon: Christene Lye, MD;  Location: [facility] ENDOSCOPY;  Service: Gastroenterology;  Laterality: N/A;   ESOPHAGOGASTRODUODENOSCOPY (EGD) WITH PROPOFOL N/A 07/10/2021   Procedure:  ESOPHAGOGASTRODUODENOSCOPY (EGD) WITH PROPOFOL;  Surgeon: Lin Landsman, MD;  Location: [facility] ENDOSCOPY;  Service: Gastroenterology;  Laterality: N/A;   GIVENS CAPSULE STUDY N/A 07/11/2021   Procedure: GIVENS CAPSULE STUDY;  Surgeon: Lucilla Lame, MD;  Location: [facility] ENDOSCOPY;  Service: Endoscopy;  Laterality: N/A;   NM MYOVIEW ([place])  07/2015   No evidence ischemia or infarction. EF 66%. Low risk   TEMPORARY DIALYSIS CATHETER N/A 12/08/2021   Procedure: TEMPORARY DIALYSIS CATHETER;  Surgeon: Algernon Huxley, MD;  Location: [place] [facility] LAB;  Service: Cardiovascular;  Laterality: N/A;   TONSILLECTOMY     TRANSTHORACIC ECHOCARDIOGRAM  10/2013   Normal LV size and function. EF 55-65%. GR 1 DD. Otherwise normal.   HPI:  Pt is a 81 y.o. Caucasian female with medical history of Multiple medical problems including CKD, DM, CHF, Migraines, Large Hiatal Hernia, GERD, HTN, CAD, Spasmodic Dysphonia and Liver Cirrhosis, on HD who presented to the emergency room with acute onset of confusion and hypotension while having her hemodialysis session on Thursday.  Per her son, she has been fairly confused since Monday of this week but was able to finish hemodialysis on Tuesday.  Her confusion worsened since the night before dialysis.  She did not have any reported chest pain or dyspnea or cough or wheezing.  No fever or chills.  No nausea or vomiting or abdominal pain.  She has not been taking her lactulose regularly due to frequent bowel movements with it.  No headache or dizziness or blurred vision.  No paresthesias or focal muscle weakness.  IMAGING: CT A/P 01/2022--  1. Cirrhotic liver with moderate volume ascites.  2. Small left and trace right pleural effusions with associated  compressive atelectasis.  3. Moderate-large hiatal hernia containing the majority of the  stomach within the chest.  4. Left-sided colonic diverticulosis without evidence for acute  diverticulitis.  5. Anasarca.  Head CT: Head ct:  Stable 7 mm in thickness left frontotemporal convexity subdural  hematoma and 3 mm in thickness left tentorial subdural bleed. No new  or worsening hemorrhage.  2. 2 mm left-to-right midline shift of the septum pellucidum, no  downward mass effect.  3. Atrophy and small-vessel disease.  MD admitting dx included: Acute hepatic encephalopathy ([place])  - This is likely the culprit for her altered mental status and confusion rather than her subdural hematoma.  - It is likely due to noncompliance with lactulose.  - She will be placed on lactulose and rifaximin.   CXR: Increased bilateral pleural effusions are noted with associated atelectasis. Per Chart Notes prior to admit: Palliative Care consult in the Home was ordered PRIOR to this admit per chart notes.   Assessment / Plan / Recommendation  Clinical Impression    Pt seen for BSE this morning. Pt was in HD; MD ok to see pt during session. Pt A/O x3(some orientation questions); followed basic commands during tasks and positioning upright in bed. She engaged easily and was eager to have some "water".  Pt has Baseline Spasmodic Dysphonia(seen for ST tx in 2019 per chart); low volume of speech.  On RA; afebrile. Confusion seems min improved this engagement per chart notes.    OF NOTE: Pt has a Chronic h/o GERD and Large Hiatal Hernia per Imaging. She endorses "stuck feelings", hiccups and belching, getting "full" at meals. This could impact oral intake overall as well as desire for oral intake at times.   Pt appeared to present w/ adequate oropharyngeal phase swallowing function w/ No overt oropharyngeal phase dysphagia appreciated during oral intake of trials; No neuromuscular swallowing deficits appreciated. Pt appears at reduced risk for aspiration from an oropharyngeal phase standpoint following general aspiration precautions.  HOWEVER, pt has a baseline presentation of GERD/REFLUX and c/o Esophageal phase dysmotility in setting of Large Hiatal Hernia. ANY  Dysmotility or Regurgitation of Reflux material can increase risk for aspiration of the Reflux material during Retrograde backflow thus impact Voicing and Pulmonary status.    Post positioning upright in bed, pt consumed po trials of ice chips, thin liquids VIA CUP(no straw) and purees. No immediate, overt clinical s/s of aspiration noted; clear vocal quality b/t trials, no decline in pulmonary status, no multiple swallows noted post initial pharyngeal swallow, no decline in O2 sats(97%). Oral phase appeared [facility] for bolus management and timely A-P transfer/clearing of material. No solids given this session d/t HD session, Large Hiatal Hernia at baseline. Will address at next session.  OM exam was [facility] for oral clearing; lingual/labial movements. No gross unilateral lingual/labial weakness. Noted mild oral tremors at Baseline. Speech intelligible in quiet setting - Baseline Spasmodic Dysphonia.    Recommend a Pureed diet initially(moistened foods) w/ Thin liquids VIA CUP - no straw use d/t increased air swallowed and less control of bolus sipping. General aspiration precautions. Rest Breaks during meals/oral intake to allow for Esophageal clearing, Belching. GERD/REFLUX precautions strongly recommended to lessen chance for Regurgitation -- remain inclined post meals for ~60 mins. HOB elevated at night when sleeping. Tray setup and support at all meals. Pills Whole vs Crushed in Puree for ease of swallowing/clearing. Pt is on a PPI per chart.   Recommend ST f/u w/ pt next 1-2 days for trials to upgrade diet(solids) as tolerates; education on REFLUX precautions and f/u w/ GI for further education on her Baseline status of Large Hiatal Hernia. Dietician f/u to maximize nutrition/food options. Discussion and handout given on general aspiration precautions; REFLUX precautions. MD/NSG updated.  SLP Visit Diagnosis: Dysphagia, unspecified (R13.10) (baseline Large Hiatal Hernia)    Aspiration Risk  Mild aspiration  risk;Risk for inadequate nutrition/hydration (reduced when following general aspiration and REFLUX precautuions)    Diet Recommendation   a Pureed diet initially(moistened foods) w/ Thin liquids VIA CUP - no straw use d/t increased air swallowed and less control of bolus sipping. General aspiration precautions. Rest Breaks during meals/oral intake to allow for Esophageal clearing, Belching. GERD/REFLUX precautions strongly recommended to lessen chance for Regurgitation -- remain inclined post meals for ~60 mins. HOB elevated at night when sleeping.  Tray setup and support at all meals.  Medication Administration: Whole meds with puree (vs need to Crush in Puree)    Other  Recommendations Recommended Consults:  (G f/u for further education on Esophageal phase dysmotility, tx. Dietician f/u) Oral Care Recommendations: Oral care BID;Oral care before and after PO;Staff/trained caregiver to provide oral care (support) Other Recommendations:  (n/a)    Recommendations for follow up therapy are one component of a multi-disciplinary discharge planning process,  led by the attending physician.  Recommendations may be updated based on patient status, additional functional criteria and insurance authorization.  Follow up Recommendations Other (comment) (while admitted; No SLP f/u at D/C expected)      Assistance Recommended at Discharge Frequent or constant Supervision/Assistance (generalized weakness)  Functional Status Assessment Patient has had a recent decline in their functional status and demonstrates the ability to make significant improvements in function in a reasonable and predictable amount of time.  Frequency and Duration min 2x/week  1 week       Prognosis Prognosis for Safe Diet Advancement: Fair (-Good) Barriers to Reach Goals: Time post onset;Severity of deficits (current Confusion; medical issues) Barriers/Prognosis Comment: baseline Large Hiatal Hernia      Swallow Study   General  Date of Onset: 03/02/22 HPI: Pt is a 81 y.o. Caucasian female with medical history of Multiple medical problems including CKD, DM, CHF, Migraines, Large Hiatal Hernia, HTN, CAD, Spasmodic Dysphonia and Liver Cirrhosis, on HD who presented to the emergency room with acute onset of confusion and hypotension while having her hemodialysis session on Thursday.  Per her son, she has been fairly confused since Monday of this week but was able to finish hemodialysis on Tuesday.  Her confusion worsened since the night before dialysis.  She did not have any reported chest pain or dyspnea or cough or wheezing.  No fever or chills.  No nausea or vomiting or abdominal pain.  She has not been taking her lactulose regularly due to frequent bowel movements with it.  No headache or dizziness or blurred vision.  No paresthesias or focal muscle weakness.  IMAGING: CT A/P 01/2022--  1. Cirrhotic liver with moderate volume ascites.  2. Small left and trace right pleural effusions with associated  compressive atelectasis.  3. Moderate-large hiatal hernia containing the majority of the  stomach within the chest.  4. Left-sided colonic diverticulosis without evidence for acute  diverticulitis.  5. Anasarca.  Head CT: Head ct: Stable 7 mm in thickness left frontotemporal convexity subdural  hematoma and 3 mm in thickness left tentorial subdural bleed. No new  or worsening hemorrhage.  2. 2 mm left-to-right midline shift of the septum pellucidum, no  downward mass effect.  3. Atrophy and small-vessel disease.  MD admitting dx included: Acute hepatic encephalopathy ([place])  - This is likely the culprit for her altered mental status and confusion rather than her subdural hematoma.  - It is likely due to noncompliance with lactulose.  - She will be placed on lactulose and rifaximin.   Per Chart Notes prior to admit: Palliative Care consult in the Home was ordered PRIOR to this admit per chart notes. Type of Study: Bedside Swallow  Evaluation Previous Swallow Assessment: BSE - none; voice tx in 2019 Diet Prior to this Study: NPO (regular diet at home per pt) Temperature Spikes Noted: No (WBC elevated; Lactic acid elevated) Respiratory Status: Room air History of Recent Intubation: No Behavior/Cognition: Alert;Cooperative;Pleasant mood;Requires cueing;Distractible (min confusion) Oral Cavity Assessment: Dry Oral Care Completed by SLP: Yes Oral Cavity - Dentition: Adequate natural dentition Vision: Functional for self-feeding Self-Feeding Abilities: Able to feed self;Needs assist;Needs set up (weak UEs) Patient Positioning: Upright in bed (needed positioning support) Baseline Vocal Quality: Low vocal intensity (min dysphonia - Baseline) Volitional Cough: Strong Volitional Swallow: Able to elicit    Oral/Motor/Sensory Function Overall Oral Motor/Sensory Function: Within functional limits   Ice Chips Ice chips: Within functional limits Presentation: Spoon (fed; 3 trials)   Thin  Liquid Thin Liquid: Within functional limits Presentation: Cup;Self Fed (~4-5 ozs (15+ trials)) Other Comments: Belching++    Nectar Thick Nectar Thick Liquid: Not tested   Honey Thick Honey Thick Liquid: Not tested   Puree Puree: Within functional limits Presentation: Spoon;Self Fed (supported fully; 10 trials)   Solid     Solid: Not tested Other Comments: during HD        Orinda Kenner, [place], [place] Speech Language Pathologist Rehab Services; [place] [phone] (ascom) Lalisa Kiehn 03/03/2022,1:36 PM

## 2022-03-03 NOTE — Hospital Course (Signed)
Patient is an 81 year old female with past medical history of end-stage renal disease, CAD status post angioplasty, diabetes mellitus, paroxysmal atrial fibrillation on Eliquis, sleep apnea, diastolic heart failure and [name] cirrhosis who has been noncompliant with her lactulose and presented to the emergency room on 11/2 with confusion.  During work-up, patient also found to have a left-sided subdural hematoma with small left to right midline shift.  Repeat scan on the early morning of 11/3 noted no change.  Patient was admitted to the hospitalist service and neurosurgery consulted.  Neurosurgery felt given patient's major medical comorbidities, not candidate for surgical intervention.  It was also felt that subdural hematoma was slightly subacute and predated change in mental status.  Patient noted to have elevated ammonia levels which were more likely felt to be because of mentation and GI consulted. 11/6.  Hemoglobin dropped down to 7.3, notified GI, started Protonix.  Transfuse 1 unit.

## 2022-03-03 NOTE — Assessment & Plan Note (Signed)
-   We will continue amiodarone.

## 2022-03-03 NOTE — Assessment & Plan Note (Addendum)
Appreciate neurosurgery consultation.  At this time, no intervention planned and given comorbidities will not be done.  More of an incidental finding and not related to her confusion.  While there is no history of fall, this is certainly a contributing factor and likely what happened to cause this issue.

## 2022-03-03 NOTE — Assessment & Plan Note (Addendum)
Nephrology consulted.  Patient had dialysis on Tuesday Thursday Saturday.  Previous dialysis session on 11/2 terminated early so patient received brief dialysis today and will keep track with regular scheduled dialysis tomorrow.

## 2022-03-03 NOTE — Progress Notes (Signed)
[facility]  ROUNDING NOTE   Subjective:   April [name] is a 81 year old female with past medical history including liver cirrhosis, anemia, diverticulitis, hypertension, CHF, CAD, and end stage renal disease on hemodialysis. She presents to the emergency department with confusion. She has been admitted for Subdural hemorrhage ([place]) [I62.00]  This patient is known to our clinic and received outpatient dialysis treatments at [facility] on a TTS schedule, supervised by Dr. Candiss Norse.  Patient received 1.5 hours of dialysis at her clinic yesterday, treatment terminated due to low blood pressure.  Patient was brought to the emergency room by her daughter-in-law who reports patient has been confused since Sunday.  According to her son, patient currently lives in her home receiving 24-hour care.  Patient has not completely lived alone since July.  Patient is alert and oriented to self. No family at bedside. Able to recall her outpatient and days she attends dialysis.  Lower extremity edema present.  Labs on ED arrival include glucose 186, creatinine 2.65 with GFR 17, calcium 8.2, albumin 2.2, elevated WBCs 21.2 with hemoglobin 8.3, lactic acid 4.0.UA appears hazy but.  Unremarkable.  CT head shows mixed density subdural hemorrhage, 8 mm, at the left cerebral convexity extending along the left tentorial leaflet.  Neurosurgery consulted.  We have been consulted to provide dialysis needs during this admission.  Objective:  Vital signs in last 24 hours:  Temp:  [97.4 F (36.3 C)-98 F (36.7 C)] 97.9 F (36.6 C) (11/03 1030) Pulse Rate:  [79-99] 99 (11/03 1100) Resp:  [13-24] 16 (11/03 1100) BP: (74-134)/(42-88) 74/54 (11/03 1100) SpO2:  [95 %-98 %] 95 % (11/03 1100)  Weight change:  There were no vitals filed for this visit.   Intake/Output: No intake/output data recorded.   Intake/Output this shift:  No intake/output data recorded.  Physical Exam: General: NAD, resting  comfortably  Head: Normocephalic, atraumatic. Moist oral mucosal membranes  Eyes: Anicteric  Lungs:  Clear to auscultation, normal effort, room air  Heart: Irregular rate and rhythm, no murmurs  Abdomen:  Soft, nontender  Extremities:  3+  peripheral edema  Neurologic: Alert, oriented to self  Skin: No lesions  Access: Rt chest permcath    Basic Metabolic Panel: Recent Labs  Lab 03/02/22 1426  NA 138  K 4.6  CL 103  CO2 25  GLUCOSE 186*  BUN 14  CREATININE 2.65*  CALCIUM 8.2*     Liver Function Tests: Recent Labs  Lab 03/02/22 1426  AST 93*  ALT 40  ALKPHOS 120  BILITOT 2.0*  PROT 5.6*  ALBUMIN 2.2*    No results for input(s): "LIPASE", "AMYLASE" in the last 168 hours. Recent Labs  Lab 03/02/22 1426 03/03/22 0543  AMMONIA 67* 63*     CBC: Recent Labs  Lab 03/02/22 1426  WBC 21.2*  HGB 8.3*  HCT 25.2*  MCV 100.8*  PLT 124*     Cardiac Enzymes: No results for input(s): "CKTOTAL", "CKMB", "CKMBINDEX", "TROPONINI" in the last 168 hours.  BNP: Invalid input(s): "POCBNP"  CBG: No results for input(s): "GLUCAP" in the last 168 hours.  Microbiology: Results for orders placed or performed during the hospital encounter of 02/01/22  Culture, blood (Routine x 2)     Status: None   Collection Time: 02/01/22 11:53 AM   Specimen: BLOOD  Result Value Ref Range Status   Specimen Description BLOOD LEFT ANTECUBITAL  Final   Special Requests   Final    BOTTLES DRAWN AEROBIC AND  ANAEROBIC Blood Culture results may not be optimal due to an excessive volume of blood received in culture bottles   Culture   Final    NO GROWTH 5 DAYS Performed at [facility], [address] [place], [place] [address]    Report Status 02/06/2022 FINAL  Final  Culture, blood (Routine x 2)     Status: None   Collection Time: 02/01/22  1:16 PM   Specimen: BLOOD  Result Value Ref Range Status   Specimen Description BLOOD BLOOD LEFT FOREARM  Final   Special Requests  BLOOD LEFT ARM Blood Culture adequate volume  Final   Culture   Final    NO GROWTH 5 DAYS Performed at [facility], [address] [place], [place] [address]    Report Status 02/06/2022 FINAL  Final  Resp Panel by RT-PCR (Flu A&B, Covid) Anterior Nasal Swab     Status: None   Collection Time: 02/01/22  1:17 PM   Specimen: Anterior Nasal Swab  Result Value Ref Range Status   SARS Coronavirus 2 by RT PCR NEGATIVE NEGATIVE Final    Comment: (NOTE) SARS-CoV-2 target nucleic acids are NOT DETECTED.  The SARS-CoV-2 RNA is generally detectable in upper respiratory specimens during the acute phase of infection. The lowest concentration of SARS-CoV-2 viral copies this assay can detect is 138 copies/mL. A negative result does not preclude SARS-Cov-2 infection and should not be used as the sole basis for treatment or other patient management decisions. A negative result may occur with  improper specimen collection/handling, submission of specimen other than nasopharyngeal swab, presence of viral mutation(s) within the areas targeted by this assay, and inadequate number of viral copies(<138 copies/mL). A negative result must be combined with clinical observations, patient history, and epidemiological information. The expected result is Negative.  Fact Sheet for Patients:  EntrepreneurPulse.com.au  Fact Sheet for Healthcare Providers:  IncredibleEmployment.be  This test is no t yet approved or cleared by the [place] FDA and  has been authorized for detection and/or diagnosis of SARS-CoV-2 by FDA under an Emergency Use Authorization (EUA). This EUA will remain  in effect (meaning this test can be used) for the duration of the COVID-19 declaration under Section 564(b)(1) of the Act, 21 U.S.C.section 360bbb-3(b)(1), unless the authorization is terminated  or revoked sooner.       Influenza A by PCR NEGATIVE NEGATIVE Final   Influenza B  by PCR NEGATIVE NEGATIVE Final    Comment: (NOTE) The Xpert Xpress SARS-CoV-2/FLU/RSV plus assay is intended as an aid in the diagnosis of influenza from Nasopharyngeal swab specimens and should not be used as a sole basis for treatment. Nasal washings and aspirates are unacceptable for Xpert Xpress SARS-CoV-2/FLU/RSV testing.  Fact Sheet for Patients: EntrepreneurPulse.com.au  Fact Sheet for Healthcare Providers: IncredibleEmployment.be  This test is not yet approved or cleared by the [place] FDA and has been authorized for detection and/or diagnosis of SARS-CoV-2 by FDA under an Emergency Use Authorization (EUA). This EUA will remain in effect (meaning this test can be used) for the duration of the COVID-19 declaration under Section 564(b)(1) of the Act, 21 U.S.C. section 360bbb-3(b)(1), unless the authorization is terminated or revoked.  Performed at [facility], [address] [place], [place] [address]   Peritoneal fluid culture w Gram Stain     Status: None   Collection Time: 02/01/22  4:59 PM   Specimen: Peritoneal Washings; Peritoneal Fluid  Result Value Ref Range Status   Specimen Description  Final    PERITONEAL Performed at [facility], [address] [place], [place] [address]    Special Requests   Final    NONE Performed at [facility], [place], [place] [address]    Gram Stain NO ORGANISMS SEEN NO WBC SEEN   Final   Culture   Final    NO GROWTH 3 DAYS Performed at [facility], [place] [address], [place], [place] [address]    Report Status 02/05/2022 FINAL  Final  MRSA Next Gen by PCR, Nasal     Status: None   Collection Time: 02/01/22 10:53 PM   Specimen: Nasal Mucosa; Nasal Swab  Result Value Ref Range Status   MRSA by PCR Next Gen NOT DETECTED NOT DETECTED Final    Comment: (NOTE) The GeneXpert MRSA Assay (FDA approved for NASAL specimens only), is one  component of a comprehensive MRSA colonization surveillance program. It is not intended to diagnose MRSA infection nor to guide or monitor treatment for MRSA infections. Test performance is not FDA approved in patients less than 25 years old. Performed at [facility], [address] [place], [place] [address]   C Difficile Quick Screen w PCR reflex     Status: None   Collection Time: 02/02/22  5:58 PM   Specimen: STOOL  Result Value Ref Range Status   C Diff antigen NEGATIVE NEGATIVE Final   C Diff toxin NEGATIVE NEGATIVE Final   C Diff interpretation No C. difficile detected.  Final    Comment: Performed at [facility], [address] [place], [place] [address]  Gastrointestinal Panel by PCR , Stool     Status: None   Collection Time: 02/02/22  5:58 PM   Specimen: Stool  Result Value Ref Range Status   Campylobacter species NOT DETECTED NOT DETECTED Final   Plesimonas shigelloides NOT DETECTED NOT DETECTED Final   Salmonella species NOT DETECTED NOT DETECTED Final   Yersinia enterocolitica NOT DETECTED NOT DETECTED Final   Vibrio species NOT DETECTED NOT DETECTED Final   Vibrio cholerae NOT DETECTED NOT DETECTED Final   Enteroaggregative E coli (EAEC) NOT DETECTED NOT DETECTED Final   Enteropathogenic E coli (EPEC) NOT DETECTED NOT DETECTED Final   Enterotoxigenic E coli (ETEC) NOT DETECTED NOT DETECTED Final   Shiga like toxin producing E coli (STEC) NOT DETECTED NOT DETECTED Final   Shigella/Enteroinvasive E coli (EIEC) NOT DETECTED NOT DETECTED Final   Cryptosporidium NOT DETECTED NOT DETECTED Final   Cyclospora cayetanensis NOT DETECTED NOT DETECTED Final   Entamoeba histolytica NOT DETECTED NOT DETECTED Final   Giardia lamblia NOT DETECTED NOT DETECTED Final   Adenovirus F40/41 NOT DETECTED NOT DETECTED Final   Astrovirus NOT DETECTED NOT DETECTED Final   Norovirus GI/GII NOT DETECTED NOT DETECTED Final   Rotavirus A NOT DETECTED NOT DETECTED Final    Sapovirus (I, II, IV, and V) NOT DETECTED NOT DETECTED Final    Comment: Performed at [facility], [address] [place], [place] [address]  Peritoneal fluid culture w Gram Stain     Status: None   Collection Time: 02/03/22  2:42 PM   Specimen: Peritoneal Washings; Peritoneal Fluid  Result Value Ref Range Status   Specimen Description   Final    PERITONEAL Performed at [facility], [address] [place], [place] [address]    Special Requests   Final    NONE Performed at [facility], [address],  [place], [place] [address]    Gram Stain NO WBC SEEN NO ORGANISMS SEEN   Final   Culture   Final    NO GROWTH 3 DAYS Performed at [facility], [place] [address], [place],  [address]    Report Status 02/07/2022 FINAL  Final    Coagulation Studies: Recent Labs    03/02/22 1551  LABPROT 17.1*  INR 1.4*     Urinalysis: Recent Labs    03/02/22 1817  COLORURINE AMBER*  LABSPEC 1.020  PHURINE 5.0  GLUCOSEU NEGATIVE  HGBUR NEGATIVE  BILIRUBINUR NEGATIVE  KETONESUR 5*  PROTEINUR NEGATIVE  NITRITE NEGATIVE  LEUKOCYTESUR NEGATIVE      Imaging: CT HEAD WO CONTRAST (5MM)  Result Date: 03/03/2022 CLINICAL DATA:  Mental status change, subdural hematoma. 6 hour follow-up from yesterday. EXAM: CT HEAD WITHOUT CONTRAST TECHNIQUE: Contiguous axial images were obtained from the base of the skull through the vertex without intravenous contrast. RADIATION DOSE REDUCTION: This exam was performed according to the departmental dose-optimization program which includes automated exposure control, adjustment of the mA and/or kV according to patient size and/or use of iterative reconstruction technique. COMPARISON:  CT yesterday at 5:45 p.m. FINDINGS: Brain: Images of the current study were completed 12:06 a.m., 03/03/2022. Again noted is a 7 mm in thickness lateral left frontotemporal convexity subdural hematoma, and a 3 mm in thickness subdural  bleed along the superior aspect of the left leaf of the tentorium. There has been no significant interval change. There is slight underlying left frontotemporal gyral crowding and no more than 2 mm of left-to-right midline shift of the septum pellucidum. There is no downward mass effect. There is mild cerebellar atrophy and mild-to-moderate cerebral atrophy and atrophic ventriculomegaly with mild small-vessel disease in the cerebral white matter. Basal cisterns are clear. No cortical based infarct, parenchymal bleed or mass is seen. Vascular: There are calcifications in the distal vertebral arteries and carotid siphons. No hyperdense central vessels. Skull: Negative for fractures or focal lesions. Sinuses/Orbits: No acute finding. No mastoid effusion or new sinus disease. Small retention cyst left sphenoid air cell. Other: None. IMPRESSION: 1. Stable 7 mm in thickness left frontotemporal convexity subdural hematoma and 3 mm in thickness left tentorial subdural bleed. No new or worsening hemorrhage. 2. 2 mm left-to-right midline shift of the septum pellucidum, no downward mass effect. 3. Atrophy and small-vessel disease. Electronically Signed   By: Telford Nab M.D.   On: 03/03/2022 00:20   CT HEAD WO CONTRAST (5MM)  Result Date: 03/02/2022 CLINICAL DATA:  [place] EXAM: CT HEAD WITHOUT CONTRAST TECHNIQUE: Contiguous axial images were obtained from the base of the skull through the vertex without intravenous contrast. RADIATION DOSE REDUCTION: This exam was performed according to the departmental dose-optimization program which includes automated exposure control, adjustment of the mA and/or kV according to patient size and/or use of iterative reconstruction technique. COMPARISON:  MRI head 04/26/2017. FINDINGS: Brain: Next density subdural hemorrhage along the left cerebral convexity, measuring up to 8 mm in thickness. Also, trace acute hemorrhage layering along the left tentorial leaflet. Trace resulting  rightward midline shift. No evidence of acute large vascular territory infarct, mass lesion or hydrocephalus. Mild for age cerebral atrophy with ex vacuo ventricular dilation. Vascular: No hyperdense vessel identified. Skull: No acute fracture. Sinuses/Orbits: Largely clear sinuses.  No acute orbital findings. Other: No mastoid effusions. IMPRESSION: Mixed density subdural hemorrhage along the left cerebral convexity and extending along the left tentorial leaflet, measuring up to 8 mm in  thickness. Intermixed hyperdense component is compatible with acute/recent hemorrhage. Trace resulting rightward midline shift. Findings discussed with Dr. Ellender Hose via telephone at 6:22 p.m. Electronically Signed   By: Margaretha Sheffield M.D.   On: 03/02/2022 18:23   DG Chest 2 View  Result Date: 03/02/2022 CLINICAL DATA:  Weakness. EXAM: CHEST - 2 VIEW COMPARISON:  February 01, 2022. FINDINGS: Stable cardiomediastinal silhouette. Right internal jugular catheter is unchanged in position. Mild bilateral pleural effusions are noted with associated atelectasis which is increased compared to prior exam. Stable hiatal hernia. Bony thorax is unremarkable. IMPRESSION: Increased bilateral pleural effusions are noted with associated atelectasis. Electronically Signed   By: Marijo Conception M.D.   On: 03/02/2022 16:40     Medications:    sodium chloride     anticoagulant sodium citrate      [START ON 03/04/2022]  stroke: early stages of recovery book   Does not apply Once   amiodarone  200 mg Oral Daily   Chlorhexidine Gluconate Cloth  6 each Topical Q0600   lactulose  30 g Oral TID   midodrine  10 mg Oral TID WC   multivitamin with minerals  1 tablet Oral Daily   pantoprazole (PROTONIX) IV  40 mg Intravenous QHS   rifaximin  550 mg Oral BID   senna-docusate  1 tablet Oral BID   torsemide  80 mg Oral Daily   acetaminophen **OR** acetaminophen (TYLENOL) oral liquid 160 mg/5 mL **OR** acetaminophen, alteplase, anticoagulant  sodium citrate, heparin, labetalol, lidocaine (PF), lidocaine-prilocaine, magnesium hydroxide, metoCLOPramide (REGLAN) injection, nitroGLYCERIN, pentafluoroprop-tetrafluoroeth  Assessment/ Plan:  April [name] is a 81 y.o.  female with past medical history including liver cirrhosis, anemia, diverticulitis, hypertension, CHF, CAD, and end stage renal disease on hemodialysis. She presents to the emergency department with confusion. She has been admitted for Subdural hemorrhage ([place]) [I62.00]  [facility] /TTS/right chest PermCath  End stage renal disease on hemodialysis.  Will maintain outpatient schedule if possible.  Patient received 1.5 hours of scheduled treatment yesterday, treatment terminated due to hypotension.  Will provide shortened treatment today UF goal 1 L as tolerated, to manage lower extremity edema.  UF lowered from 2 L due to hypotension.  Patient will receive regularly scheduled treatment tomorrow.   2. Anemia of chronic kidney disease  Lab Results  Component Value Date   HGB 8.3 (L) 03/02/2022  Hemoglobin below desired target.  Patient receives [place] outpatient.  We will continue to monitor and consider EPO with dialysis treatments during admission.   3. Secondary Hyperparathyroidism:   Lab Results  Component Value Date   PTH 32 02/03/2022   CALCIUM 8.2 (L) 03/02/2022   CAION 1.17 03/25/2016   PHOS 3.4 02/07/2022    We will continue to monitor bone minerals during this admission.  4.  Subdural hemorrhage, seen on CT scan.  Neurosurgery consulted.  We will hold heparin with dialysis.   LOS: 0 Jacquie Lukes 11/3/202311:20 AM

## 2022-03-03 NOTE — Progress Notes (Signed)
SLP Cancellation Note  Patient Details Name: April [name] MRN: [id number] DOB: 1940-11-20   Cancelled treatment:       Reason Eval/Treat Not Completed: Patient at procedure or test/unavailable; pt currently in HD. Also, Lactulose/txs ongoing -- see MD notes indicating Acute hepatic encephalopathy may be d/t impact of noncompliance with lactulose. Will hold on Cognitive-linguistic assessment today and f/u tomorrow for assessment of needs/tx. MD/NSG updated.     Orinda Kenner, MS, CCC-SLP Speech Language Pathologist Rehab Services; [place] [phone] (ascom) [place] 03/03/2022, 1:55 PM

## 2022-03-03 NOTE — ED Notes (Addendum)
Pt back from dialysis. Hooked back up to monitor.

## 2022-03-03 NOTE — Consult Note (Signed)
Referring Physician:  No referring provider defined for this encounter.  Primary Physician:  Einar Pheasant, MD  History of Present Illness: 03/03/2022 April [name] is here today with a chief complaint of altered mental status.  The patient is unable to give full history.  She has multiple major medical issues and is on dialysis for end-stage renal disease.  She also has cirrhosis with ascites.  There is concern that she has elevated ammonia levels that could be causing her altered mental status.  During her work-up, CT scan was obtained showing mixed density left-sided subdural hematoma.  She currently denies headache, nausea, or vomiting.   Review of Systems:  Unable to obtain  Past Medical History: Past Medical History:  Diagnosis Date   Anemia    CAD S/P PCI pRCA Promus DES 3.5 x 16 (4.1 mm), ostRPDA Promus DES 2.5 x 12 (2.7 mm) 03/25/2016   CAD S/P percutaneous coronary angioplasty    a. 07/2015 MV: No ischemia, EF 66%;  b. 03/2016 Inflat STEMI/PCI: LM nl, LAD 25d, RI nl, LCX nl, OM1/2 nl, RCA 80p (3.5x16 Promus Premier DES), 50p/m, 30d, RPDA 90 (2.5x12 Promu Premier DES); c. 01/2018 Cath ([place], [place]): Patent RCA stents->Med Rx.   CHF (congestive heart failure) ([place])    CKD (chronic kidney disease), stage III ([place])    Diabetes mellitus without complication ([place])    Diastolic dysfunction    a. 10/2013 Echo: EF 55-65%, Gr1 DD; b. 01/2018 Echo ([place], [place]): EF 55-60%, Gr2 DD, RVSP 63mHg.   Diverticulitis    Dyspnea    Endometriosis    Family history of adverse reaction to anesthesia    Mother - had to stay in ICU because of breathing difficulties every time she had anesthesia   GERD (gastroesophageal reflux disease)    GI bleed    a. 06/2021 - melena-->2 u PRBCs.   Hiatal hernia    History of colon polyps 08/1994   History of Migraines    Hypercholesterolemia    Hypertension    LBBB (left bundle branch block)    Osteoporosis     PAF (paroxysmal atrial fibrillation) ([facility]    a. 03/2016 Dx @ time of MI; b. CHA2DS2VASc = 5-->Eliquis.   Rheumatic fever    Sleep apnea    CPAP   Spasmodic dysphonia    ST elevation myocardial infarction (STEMI) of inferolateral wall, initial episode of care ([facility] 03/25/2016   ST elevation myocardial infarction (STEMI) of inferolateral wall, initial episode of care ([facility] 03/25/2016   Urine incontinence     Past Surgical History: Past Surgical History:  Procedure Laterality Date   ABDOMINAL HYSTERECTOMY     ovaries not removed   APPENDECTOMY     was removed during hysterectomy   Breast biopsies     x2   BREAST EXCISIONAL BIOPSY Bilateral "years ago"   neg   BREAST SURGERY Bilateral    BROW LIFT Bilateral 08/15/2019   Procedure: BLEPHAROPLASTY UPPER EYELID; W/EXCESS SKIN BLEPHAROPTOSIS REPAIR; RESECT EX;  Surgeon: FKarle Starch MD;  [facility]  Service: Ophthalmology;  Laterality: Bilateral;  sleep apnea   CARDIAC CATHETERIZATION  2011   moderate 40% RCA disease   CARDIAC CATHETERIZATION  01/2010   Dr. Gollan'@[facility]'$ : Only noted 40% RCA   CARDIAC CATHETERIZATION N/A 03/25/2016   Procedure: Left Heart Cath and Coronary Angiography;  Surgeon: D12/11/2015 MD;  [facility];  Service: Cardiovascular;  Laterality: N/A;  CARDIAC CATHETERIZATION N/A 03/25/2016   Procedure: Coronary Stent Intervention;  Surgeon: Leonie Man, MD;  Location: [place] [facility];  Service: Cardiovascular;  Laterality: N/A;   COLONOSCOPY  2013   COLONOSCOPY WITH PROPOFOL N/A 07/11/2021   Procedure: COLONOSCOPY WITH PROPOFOL;  Surgeon: Lucilla Lame, MD;  Location[facility]COPY;  Service: Endoscopy;  Laterality: N/A;   DIALYSIS/PERMA CATHETER INSERTION N/A 12/15/2021   Procedure: DIALYSIS/PERMA CATHETER INSERTION;  Surgeon: Algernon Huxley, MD;  Location: [place] CV LAB;  Service: Cardiovascular;  Laterality: N/A;   ENTEROSCOPY N/A 02/11/2022   Procedure:  ENTEROSCOPY;  Surgeon: Toledo, Benay Pike, MD;  Location: [facility] ENDOSCOPY;  Service: Gastroenterology;  Laterality: N/A;   ESOPHAGOGASTRODUODENOSCOPY (EGD) WITH PROPOFOL N/A 03/17/2015   Procedure: ESOPHAGOGASTRODUODENOSCOPY (EGD) WITH PROPOFOL;  Surgeon: Christene Lye, MD;  Location: [facility] ENDOSCOPY;  Service: Gastroenterology;  Laterality: N/A;   ESOPHAGOGASTRODUODENOSCOPY (EGD) WITH PROPOFOL N/A 07/10/2021   Procedure: ESOPHAGOGASTRODUODENOSCOPY (EGD) WITH PROPOFOL;  Surgeon: Lin Landsman, MD;  Location[facility] ENDOSCOPY;  Service: Gastroenterology;  Laterality: N/A;   GIVENS CAPSULE STUDY N/A 07/11/2021   Procedure: GIVENS CAPSULE STUDY;  Surgeon: Lucilla Lame, MD;  [facility] END[facility]COPY;  Service: Endoscopy;  Laterality: N/A;   NM MYOVIEW ([place] [facility])  07/2015   No evidence ischemia or infarction. EF 66%. Low risk   TEMPORARY DIALYSIS CATHETER N/A 12/08/2021   Procedure: TEMPORARY DIALYSIS CATHETER;  Surgeon: Algernon Huxley, MD;  Location: [place] CV LAB;  Service: Cardiovascular;  Laterality: N/A;   TONSILLECTOMY     TRANSTHORACIC ECHOCARDIOGRAM  10/2013   Normal LV size and function. EF 55-65%. GR 1 DD. Otherwise normal.    Allergies: Allergies as of 03/02/2022 - Review Complete 03/02/2022  Allergen Reaction Noted   Penicillins Other (See Comments) 10/11/2012   Sulfa antibiotics Rash 07/08/2012    Medications: Current Meds  Medication Sig   amiodarone (PACERONE) 200 MG tablet Take 1 tablet (200 mg total) by mouth daily.   docusate sodium (STOOL SOFTENER) 100 MG capsule Take 1 capsule (100 mg total) by mouth 2 (two) times daily.   epoetin alfa (EPOGEN) 10000 UNIT/ML injection Inject 0.8 mLs (8,000 Units total) into the vein Every Tuesday,Thursday,and Saturday with dialysis.   hydrocortisone (ANUSOL-HC) 25 MG suppository Place 25 mg rectally 2 (two) times daily.   midodrine (PROAMATINE) 10 MG tablet Take 1 tablet (10 mg total) by mouth 3 (three) times daily with meals.    Multiple Vitamins-Minerals (MULTIVITAMIN WITH MINERALS) tablet Take 1 tablet by mouth daily.   pantoprazole (PROTONIX) 40 MG tablet Take 1 tablet (40 mg total) by mouth 2 (two) times daily before a meal.   rifaximin (XIFAXAN) 550 MG TABS tablet Take 1 tablet (550 mg total) by mouth 2 (two) times daily.   torsemide 40 MG TABS Take 80 mg by mouth daily.    Social History: Social History   Tobacco Use   Smoking status: Never   Smokeless tobacco: Never  Vaping Use   Vaping Use: Never used  Substance Use Topics   Alcohol use: No    Alcohol/week: 0.0 standard drinks of alcohol   Drug use: No    Family Medical History: Family History  Problem Relation Age of Onset   Arthritis Mother    Stroke Mother    Hypertension Mother    Osteoporosis Mother    Heart disease Father        MI   Heart attack Father    Stroke Brother    Heart attack  Sister    Breast cancer Other        first cousin x 2   Stroke Daughter    Heart attack Daughter    Colon cancer Neg Hx     Physical Examination: Vitals:   03/03/22 0700 03/03/22 0800  BP: (!) 103/53 (!) 120/46  Pulse: 85 86  Resp: 16 18  Temp:    SpO2: 96% 96%    General: Patient is well developed, well nourished, calm, collected, and in no apparent distress. Attention to examination is appropriate.  Neck:   Supple.  Full range of motion.  Respiratory: Patient is breathing without any difficulty.   NEUROLOGICAL:     Awake, alert, oriented to person, place, and 2023.  She cannot tell me the month.  Speech is slightly garbled but she is able to communicate .  Cranial Nerves: Pupils equal round and reactive to light.  Facial tone is symmetric.  Facial sensation is symmetric. Shoulder shrug is symmetric. Tongue protrusion is midline.  There is no pronator drift.  ROM of spine: full.    Strength: Side Biceps Triceps Deltoid Interossei Grip Wrist Ext. Wrist Flex.  R '5 5 5 5 5 5 5  '$ L '5 5 5 5 5 5 5   '$ Side Iliopsoas Quads Hamstring  PF DF EHL  R '5 5 5 5 5 5  '$ L '5 5 5 5 5 5   '$ Reflexes are 1+ and symmetric at the biceps, triceps, brachioradialis, patella and achilles.   Hoffman's is absent.   Bilateral upper and lower extremity sensation is intact to light touch.    No evidence of dysmetria noted.  Gait is untested.     Medical Decision Making  Imaging: CT Head 03/03/22 IMPRESSION: 1. Stable 7 mm in thickness left frontotemporal convexity subdural hematoma and 3 mm in thickness left tentorial subdural bleed. No new or worsening hemorrhage. 2. 2 mm left-to-right midline shift of the septum pellucidum, no downward mass effect. 3. Atrophy and small-vessel disease.     Electronically Signed   By: Telford Nab M.D.   On: 03/03/2022 00:20  I have personally reviewed the images and agree with the above interpretation.  Assessment and Plan: April [name] is a pleasant 81 y.o. female with stable subacute subdural hematoma over the left frontal and temporal lobes.  She has a small amount of left-to-right midline shift.  She is not currently exhibiting any symptoms of subdural hematoma.  Given her other major medical comorbidities, I would recommend against surgical intervention or other procedures for her subdural hematoma.  This may be a contributing factor to her altered mental status, but likely predated her change in mental status over the past few days.  Her current INR and platelet level are appropriate for management of her subdural hematoma.  She will be admitted to the hospital due to her medical issues.  I am available if she has a decline, but I would have a high threshold before consideration of any surgical intervention given her liver and kidney failure.  Level 5 qualifier-patient is unable to participate in taking history and obtaining review of systems.    April Maj K. Izora Ribas MD, [facility] Neurosurgery

## 2022-03-03 NOTE — Assessment & Plan Note (Signed)
-   We will continue PPI therapy 

## 2022-03-03 NOTE — ED Notes (Signed)
ED TO INPATIENT HANDOFF REPORT  ED Nurse Name and Phone #: Baxter Flattery, RN  S Name/Age/Gender April [name] 81 y.o. female Room/Bed: ED36A/ED36A  Code Status   Code Status: DNR  Home/SNF/Other Home Patient oriented to: self, place, time, and situation Is this baseline? Yes   Triage Complete: Triage complete  Chief Complaint Subdural hemorrhage ([place]) [I62.00]  Triage Note First Nurse Note:  Arrives with daughter in law.  Reports increasing confusion since Sunday.  Patient lives at [facility].  Went to dialysis today and BP was low.  Paracentesis scheduled for tomorrow, also PCP ordered an outpatient ammonia level and urinalysis (neither has been collected).  Pt come with c/o hypotension. Pt was at dialysis for treatment today but BP was too low. Pt also has had some increased confusion per family.  Pt has also gotten more weak per family. Pt now uses wheelchair.   Pt is from [place] with 24/7 nursing care.   Pt is A*O to place and self at this time.    Allergies Allergies  Allergen Reactions   Penicillins Other (See Comments)    Okay to take amoxicillin/(pt does not recall what the reaction to penicillin was (18-52 years old)    Sulfa Antibiotics Rash    Level of Care/Admitting Diagnosis ED Disposition     ED Disposition  Admit   Condition  --   [place]: [place] [[id number]]  Level of Care: Progressive [102]  Admit to Progressive based on following criteria: NEUROLOGICAL AND NEUROSURGICAL complex patients with significant risk of instability, who do not meet ICU criteria, yet require close observation or frequent assessment (< / = every 2 - 4 hours) with medical / nursing intervention.  Covid Evaluation: Asymptomatic - no recent exposure (last 10 days) testing not required  Diagnosis: Subdural hemorrhage ([place]) [432.1.ICD-9-CM]  Admitting Physician: Christel Mormon [[id number]]  Attending Physician: Christel Mormon [[id number]]   Certification:: I certify this patient will need inpatient services for at least 2 midnights  Estimated Length of Stay: 3          B Medical/Surgery History Past Medical History:  Diagnosis Date   Anemia    CAD S/P PCI pRCA Promus DES 3.5 x 16 (4.1 mm), ostRPDA Promus DES 2.5 x 12 (2.7 mm) 03/25/2016   CAD S/P percutaneous coronary angioplasty    a. 07/2015 MV: No ischemia, EF 66%;  b. 03/2016 Inflat STEMI/PCI: LM nl, LAD 25d, RI nl, LCX nl, OM1/2 nl, RCA 80p (3.5x16 Promus Premier DES), 50p/m, 30d, RPDA 90 (2.5x12 Promu Premier DES); c. 01/2018 Cath ([place], [place]): Patent RCA stents->Med Rx.   CHF (congestive heart failure) ([place])    CKD (chronic kidney disease), stage III ([place])    Diabetes mellitus without complication ([place])    Diastolic dysfunction    a. 10/2013 Echo: EF 55-65%, Gr1 DD; b. 01/2018 Echo ([place], [place]): EF 55-60%, Gr2 DD, RVSP 66mHg.   Diverticulitis    Dyspnea    Endometriosis    Family history of adverse reaction to anesthesia    Mother - had to stay in ICU because of breathing difficulties every time she had anesthesia   GERD (gastroesophageal reflux disease)    GI bleed    a. 06/2021 - melena-->2 u PRBCs.   Hiatal hernia    History of colon polyps 08/1994   History of Migraines    Hypercholesterolemia    Hypertension    LBBB (left bundle branch block)  Osteoporosis    PAF (paroxysmal atrial fibrillation) ([place])    a. 03/2016 Dx @ time of MI; b. CHA2DS2VASc = 5-->Eliquis.   Rheumatic fever    Sleep apnea    CPAP   Spasmodic dysphonia    ST elevation myocardial infarction (STEMI) of inferolateral wall, initial episode of care ([place]) 03/25/2016   ST elevation myocardial infarction (STEMI) of inferolateral wall, initial episode of care [facility]) 03/25/2016   Urine incontinence    Past Surgical History:  Procedure Laterality Date   ABDOMINAL HYSTERECTOMY     ovaries not removed   APPENDECTOMY     was removed during  hysterectomy   Breast biopsies     x2   BREAST EXCISIONAL BIOPSY Bilateral "years ago"   neg   BREAST SURGERY Bilateral    BROW LIFT Bilateral 08/15/2019   Procedure: BLEPHAROPLASTY UPPER EYELID; W/EXCESS SKIN BLEPHAROPTOSIS REPAIR; RESECT EX;  Surgeon: Karle Starch, MD;  Location: [facility];  Service: Ophthalmology;  Laterality: Bilateral;  sleep apnea   CARDIAC CATHETERIZATION  2011   moderate 40% RCA disease   CARDIAC CATHETERIZATION  01/2010   Dr. Gollan'@[facility]'$ : Only noted 40% RCA   CARDIAC CATHETERIZATION N/A 03/25/2016   Procedure: Left Heart Cath and Coronary Angiography;  Surgeon: 04/07/2016, MD;  Location: [place] CV LAB;  Service: Cardiovascular;  Laterality: N/A;   CARDIAC CATHETERIZATION N/A 03/25/2016   Procedure: Coronary Stent Intervention;  Surgeon: 04/07/2016, MD;  Location: [place] CV LAB;  Service: Cardiovascular;  Laterality: N/A;   COLONOSCOPY  2013   COLONOSCOPY WITH PROPOFOL N/A 07/11/2021   Procedure: COLONOSCOPY WITH PROPOFOL;  Surgeon: 07/24/2021, MD;  Location[facility] ENDOSCOPY;  Service: Endoscopy;  Laterality: N/A;   DIALYSIS/PERMA CATHETER INSERTION N/A 12/15/2021   Procedure: DIALYSIS/PERMA CATHETER INSERTION;  Surgeon: 12/28/2021, MD;  Location: [place] CV LAB;  Service: Cardiovascular;  Laterality: N/A;   ENTEROSCOPY N/A 02/11/2022   Procedure: ENTEROSCOPY;  Surgeon: [place], 02/24/2022, MD;  Location: [facility] ENDOSCOPY;  Service: Gastroenterology;  Laterality: N/A;   ESOPHAGOGASTRODUODENOSCOPY (EGD) WITH PROPOFOL N/A 03/17/2015   Procedure: ESOPHAGOGASTRODUODENOSCOPY (EGD) WITH PROPOFOL;  Surgeon: 03/30/2015, MD;  Location: [facility] ENDOSCOPY;  Service: Gastroenterology;  Laterality: N/A;   ESOPHAGOGASTRODUODENOSCOPY (EGD) WITH PROPOFOL N/A 07/10/2021   Procedure: ESOPHAGOGASTRODUODENOSCOPY (EGD) WITH PROPOFOL;  Surgeon: 16/03/2022, MD;  Location: [facility] ENDOSCOPY;  Service: Gastroenterology;  Laterality: N/A;    GIVENS CAPSULE STUDY N/A 07/11/2021   Procedure: GIVENS CAPSULE STUDY;  Surgeon: 07/24/2021, MD;  Location: [facility] ENDOSCOPY;  Service: Endoscopy;  Laterality: N/A;   NM MYOVIEW ([place] HX)  07/2015   No evidence ischemia or infarction. EF 66%. Low risk   TEMPORARY DIALYSIS CATHETER N/A 12/08/2021   Procedure: TEMPORARY DIALYSIS CATHETER;  Surgeon: 21/01/2022, MD;  Location: [place] CV LAB;  Service: Cardiovascular;  Laterality: N/A;   TONSILLECTOMY     TRANSTHORACIC ECHOCARDIOGRAM  10/2013   Normal LV size and function. EF 55-65%. GR 1 DD. Otherwise normal.     A IV Location/Drains/Wounds Patient Lines/Drains/Airways Status     Active Line/Drains/Airways     Name Placement date Placement time Site Days   Peripheral IV 02/02/22 20 G 1.88" Left;Lateral;Distal Forearm 02/02/22  0330  Forearm  29   Hemodialysis Catheter Right Internal jugular Double lumen Permanent (Tunneled) 12/15/21  1059  Internal jugular  78   Pressure Injury Ankle Left;Posterior Stage 1 -  Intact skin with non-blanchable redness of a localized area  usually over a bony prominence. --  --  -- --   Wound / Incision (Open or Dehisced) 12/18/21 Other (Comment) Thigh Posterior;Right 4 fluid filled blisters 12/18/21  0650  Thigh  75            Intake/Output Last 24 hours  Intake/Output Summary (Last 24 hours) at 03/03/2022 1613 Last data filed at 03/03/2022 1316 Gross per 24 hour  Intake --  Output 300 ml  Net -300 ml    Labs/Imaging Results for orders placed or performed during the hospital encounter of 03/02/22 (from the past 48 hour(s))  Comprehensive metabolic panel     Status: Abnormal   Collection Time: 03/02/22  2:26 PM  Result Value Ref Range   Sodium 138 135 - 145 mmol/L   Potassium 4.6 3.5 - 5.1 mmol/L   Chloride 103 98 - 111 mmol/L   CO2 25 22 - 32 mmol/L   Glucose, Bld 186 (H) 70 - 99 mg/dL    Comment: Glucose reference range applies only to samples taken after fasting for at least 8 hours.    BUN 14 8 - 23 mg/dL   Creatinine, Ser 2.65 (H) 0.44 - 1.00 mg/dL   Calcium 8.2 (L) 8.9 - 10.3 mg/dL   Total Protein 5.6 (L) 6.5 - 8.1 g/dL   Albumin 2.2 (L) 3.5 - 5.0 g/dL   AST 93 (H) 15 - 41 U/L   ALT 40 0 - 44 U/L   Alkaline Phosphatase 120 38 - 126 U/L   Total Bilirubin 2.0 (H) 0.3 - 1.2 mg/dL   GFR, Estimated 17 (L) >60 mL/min    Comment: (NOTE) Calculated using the CKD-EPI Creatinine Equation (2021)    Anion gap 10 5 - 15    Comment: Performed at [facility], [address] [place], [place] [address]  CBC     Status: Abnormal   Collection Time: 03/02/22  2:26 PM  Result Value Ref Range   WBC 21.2 (H) 4.0 - 10.5 K/uL   RBC 2.50 (L) 3.87 - 5.11 MIL/uL   Hemoglobin 8.3 (L) 12.0 - 15.0 g/dL   HCT 25.2 (L) 36.0 - 46.0 %   MCV 100.8 (H) 80.0 - 100.0 fL   MCH 33.2 26.0 - 34.0 pg   MCHC 32.9 30.0 - 36.0 g/dL   RDW 22.9 (H) 11.5 - 15.5 %   Platelets 124 (L) 150 - 400 K/uL   nRBC 0.7 (H) 0.0 - 0.2 %    Comment: Performed at [facility], [address] [place], [place] [address]  Ammonia     Status: Abnormal   Collection Time: 03/02/22  2:26 PM  Result Value Ref Range   Ammonia 67 (H) 9 - 35 umol/L    Comment: Performed at [name], [address] [place], [place] [address]  Protime-INR     Status: Abnormal   Collection Time: 03/02/22  3:51 PM  Result Value Ref Range   Prothrombin Time 17.1 (H) 11.4 - 15.2 seconds   INR 1.4 (H) 0.8 - 1.2    Comment: (NOTE) INR goal varies based on device and disease states. Performed at [facility], [address] [place],  [address]   Procalcitonin - Baseline     Status: None   Collection Time: 03/02/22  3:51 PM  Result Value Ref Range   Procalcitonin 0.72 ng/mL    Comment:        Interpretation: PCT > 0.5 ng/mL and <= 2 ng/mL: Systemic infection (  sepsis) is possible, but other conditions are known to elevate PCT as well. (NOTE)       Sepsis PCT Algorithm           Lower  Respiratory Tract                                      Infection PCT Algorithm    ----------------------------     ----------------------------         PCT < 0.25 ng/mL                PCT < 0.10 ng/mL          Strongly encourage             Strongly discourage   discontinuation of antibiotics    initiation of antibiotics    ----------------------------     -----------------------------       PCT 0.25 - 0.50 ng/mL            PCT 0.10 - 0.25 ng/mL               OR       >80% decrease in PCT            Discourage initiation of                                            antibiotics      Encourage discontinuation           of antibiotics    ----------------------------     -----------------------------         PCT >= 0.50 ng/mL              PCT 0.26 - 0.50 ng/mL                AND       <80% decrease in PCT             Encourage initiation of                                             antibiotics       Encourage continuation           of antibiotics    ----------------------------     -----------------------------        PCT >= 0.50 ng/mL                  PCT > 0.50 ng/mL               AND         increase in PCT                  Strongly encourage                                      initiation of antibiotics    Strongly encourage escalation           of antibiotics                                     -----------------------------  PCT <= 0.25 ng/mL                                                 OR                                        > 80% decrease in PCT                                      Discontinue / Do not initiate                                             antibiotics  Performed at [facility], [address] [place], [place] [address]   Lactic acid, plasma     Status: Abnormal   Collection Time: 03/02/22  4:54 PM  Result Value Ref Range   Lactic Acid, Venous 4.0 (HH) 0.5 - 1.9 mmol/L    Comment: CRITICAL RESULT  CALLED TO, READ BACK BY AND VERIFIED WITH LISA GERSLER AT [phone] ON 03/02/22 BY SS Performed at [facility], [address] [place], [place] [address]   Urinalysis, Routine w reflex microscopic     Status: Abnormal   Collection Time: 03/02/22  6:17 PM  Result Value Ref Range   Color, Urine AMBER (A) YELLOW    Comment: BIOCHEMICALS MAY BE AFFECTED BY COLOR   APPearance HAZY (A) CLEAR   Specific Gravity, Urine 1.020 1.005 - 1.030   pH 5.0 5.0 - 8.0   Glucose, UA NEGATIVE NEGATIVE mg/dL   Hgb urine dipstick NEGATIVE NEGATIVE   Bilirubin Urine NEGATIVE NEGATIVE   Ketones, ur 5 (A) NEGATIVE mg/dL   Protein, ur NEGATIVE NEGATIVE mg/dL   Nitrite NEGATIVE NEGATIVE   Leukocytes,Ua NEGATIVE NEGATIVE    Comment: Performed at [facility], [address] [place], [place] [address]  Lactic acid, plasma     Status: Abnormal   Collection Time: 03/02/22  7:45 PM  Result Value Ref Range   Lactic Acid, Venous 2.7 (HH) 0.5 - 1.9 mmol/L    Comment: CRITICAL VALUE NOTED. VALUE IS CONSISTENT WITH PREVIOUSLY REPORTED/CALLED VALUE SS Performed at [facility], [address] [place], [place] [address]   Ammonia     Status: Abnormal   Collection Time: 03/03/22  5:43 AM  Result Value Ref Range   Ammonia 63 (H) 9 - 35 umol/L    Comment: Performed at [facility], [address] [place], [place] [address]   CT HEAD WO CONTRAST (5MM)  Result Date: 03/03/2022 CLINICAL DATA:  Mental status change, subdural hematoma. 6 hour follow-up from yesterday. EXAM: CT HEAD WITHOUT CONTRAST TECHNIQUE: Contiguous axial images were obtained from the base of the skull through the vertex without intravenous contrast. RADIATION DOSE REDUCTION: This exam was performed according to the departmental dose-optimization program which includes automated exposure control, adjustment of the mA and/or kV according to patient size and/or use of iterative reconstruction technique. COMPARISON:  CT  yesterday at 5:45 p.m. FINDINGS: Brain: Images of the current study were completed 12:06  a.m., 03/03/2022. Again noted is a 7 mm in thickness lateral left frontotemporal convexity subdural hematoma, and a 3 mm in thickness subdural bleed along the superior aspect of the left leaf of the tentorium. There has been no significant interval change. There is slight underlying left frontotemporal gyral crowding and no more than 2 mm of left-to-right midline shift of the septum pellucidum. There is no downward mass effect. There is mild cerebellar atrophy and mild-to-moderate cerebral atrophy and atrophic ventriculomegaly with mild small-vessel disease in the cerebral white matter. Basal cisterns are clear. No cortical based infarct, parenchymal bleed or mass is seen. Vascular: There are calcifications in the distal vertebral arteries and carotid siphons. No hyperdense central vessels. Skull: Negative for fractures or focal lesions. Sinuses/Orbits: No acute finding. No mastoid effusion or new sinus disease. Small retention cyst left sphenoid air cell. Other: None. IMPRESSION: 1. Stable 7 mm in thickness left frontotemporal convexity subdural hematoma and 3 mm in thickness left tentorial subdural bleed. No new or worsening hemorrhage. 2. 2 mm left-to-right midline shift of the septum pellucidum, no downward mass effect. 3. Atrophy and small-vessel disease. Electronically Signed   By: Telford Nab M.D.   On: 03/03/2022 00:20   CT HEAD WO CONTRAST (5MM)  Result Date: 03/02/2022 CLINICAL DATA:  [place] EXAM: CT HEAD WITHOUT CONTRAST TECHNIQUE: Contiguous axial images were obtained from the base of the skull through the vertex without intravenous contrast. RADIATION DOSE REDUCTION: This exam was performed according to the departmental dose-optimization program which includes automated exposure control, adjustment of the mA and/or kV according to patient size and/or use of iterative reconstruction technique. COMPARISON:   MRI head 04/26/2017. FINDINGS: Brain: Next density subdural hemorrhage along the left cerebral convexity, measuring up to 8 mm in thickness. Also, trace acute hemorrhage layering along the left tentorial leaflet. Trace resulting rightward midline shift. No evidence of acute large vascular territory infarct, mass lesion or hydrocephalus. Mild for age cerebral atrophy with ex vacuo ventricular dilation. Vascular: No hyperdense vessel identified. Skull: No acute fracture. Sinuses/Orbits: Largely clear sinuses.  No acute orbital findings. Other: No mastoid effusions. IMPRESSION: Mixed density subdural hemorrhage along the left cerebral convexity and extending along the left tentorial leaflet, measuring up to 8 mm in thickness. Intermixed hyperdense component is compatible with acute/recent hemorrhage. Trace resulting rightward midline shift. Findings discussed with Dr. Ellender Hose via telephone at 6:22 p.m. Electronically Signed   By: Margaretha Sheffield M.D.   On: 03/02/2022 18:23   DG Chest 2 View  Result Date: 03/02/2022 CLINICAL DATA:  Weakness. EXAM: CHEST - 2 VIEW COMPARISON:  February 01, 2022. FINDINGS: Stable cardiomediastinal silhouette. Right internal jugular catheter is unchanged in position. Mild bilateral pleural effusions are noted with associated atelectasis which is increased compared to prior exam. Stable hiatal hernia. Bony thorax is unremarkable. IMPRESSION: Increased bilateral pleural effusions are noted with associated atelectasis. Electronically Signed   By: Marijo Conception M.D.   On: 03/02/2022 16:40    Pending Labs Unresulted Labs (From admission, onward)     Start     Ordered   03/03/22 0827  Hepatitis B surface antigen  Once,   R        03/03/22 0826   03/03/22 0827  Hepatitis B surface antibody,quantitative  Once,   R        03/03/22 0826   03/03/22 0827  Hepatitis B e antibody  Once,   R        03/03/22 0826   03/03/22 0500  Ammonia  Daily at 5am,   R      03/03/22 0151             Vitals/Pain Today's Vitals   03/03/22 1316 03/03/22 1400 03/03/22 1430 03/03/22 1500  BP: (!) 116/53 114/85 (!) 133/54 (!) 129/54  Pulse: 98 (!) 103 (!) 101 94  Resp: 20 (!) 23  20  Temp: 97.9 F (36.6 C)     TempSrc: Oral     SpO2: 99% 98% 98% 97%  PainSc:        Isolation Precautions No active isolations  Medications Medications  midodrine (PROAMATINE) tablet 10 mg (10 mg Oral Given 03/03/22 1532)  rifaximin (XIFAXAN) tablet 550 mg (550 mg Oral Given 03/03/22 1607)  amiodarone (PACERONE) tablet 200 mg (200 mg Oral Given 03/03/22 1528)  nitroGLYCERIN (NITROSTAT) SL tablet 0.4 mg (has no administration in time range)  torsemide (DEMADEX) tablet 80 mg (80 mg Oral Given 03/03/22 1528)  multivitamin with minerals tablet 1 tablet (1 tablet Oral Given 03/03/22 1528)   stroke: early stages of recovery book (has no administration in time range)  acetaminophen (TYLENOL) tablet 650 mg (has no administration in time range)    Or  acetaminophen (TYLENOL) 160 MG/5ML solution 650 mg (has no administration in time range)    Or  acetaminophen (TYLENOL) suppository 650 mg (has no administration in time range)  senna-docusate (Senokot-S) tablet 1 tablet (1 tablet Oral Given 03/03/22 1532)  pantoprazole (PROTONIX) injection 40 mg (40 mg Intravenous Given 03/03/22 0309)  0.9 %  sodium chloride infusion ( Intravenous Not Given 03/03/22 0310)  labetalol (NORMODYNE) injection 20 mg (has no administration in time range)  magnesium hydroxide (MILK OF MAGNESIA) suspension 30 mL (has no administration in time range)  metoCLOPramide (REGLAN) injection 5 mg (has no administration in time range)  lactulose (CHRONULAC) 10 GM/15ML solution 30 g (30 g Oral Given 03/03/22 1534)  Chlorhexidine Gluconate Cloth 2 % PADS 6 each (6 each Topical Not Given 03/03/22 1520)  pentafluoroprop-tetrafluoroeth (GEBAUERS) aerosol 1 Application (has no administration in time range)  lidocaine (PF) (XYLOCAINE) 1 % injection 5 mL  (has no administration in time range)  lidocaine-prilocaine (EMLA) cream 1 Application (has no administration in time range)  heparin injection 1,000 Units (has no administration in time range)  anticoagulant sodium citrate solution 5 mL (has no administration in time range)  alteplase (CATHFLO ACTIVASE) injection 2 mg (has no administration in time range)  lactulose (CHRONULAC) 10 GM/15ML solution 20 g (20 g Oral Given 03/02/22 1640)  lactulose (CHRONULAC) enema 200 gm (300 mLs Rectal Given 03/02/22 2040)    Mobility walks with device High fall risk   Focused Assessments Cardiac Assessment Handoff:  Cardiac Rhythm: Normal sinus rhythm Lab Results  Component Value Date   TROPONINI <0.03 11/11/2016   No results found for: "DDIMER" Does the Patient currently have chest pain? No   , Renal Assessment Handoff:  Hemodialysis Schedule: Hemodialysis Schedule: Monday/Wednesday/Friday Last Hemodialysis date and time: Today   Restricted appendage: right  chest   R Recommendations: See Admitting Provider Note  Report given to:   Additional Notes:

## 2022-03-03 NOTE — Assessment & Plan Note (Addendum)
-   This is likely the culprit for her altered mental status and confusion rather than her subdural hematoma. - It is likely due to noncompliance with lactulose.  Lactulose restarted as well as rifaximin.  Patient mentation improving.

## 2022-03-03 NOTE — ED Notes (Signed)
Pt remains in dialysis

## 2022-03-03 NOTE — ED Notes (Signed)
Informed RN bed assigned 

## 2022-03-03 NOTE — ED Notes (Signed)
Patient transported to dialysis

## 2022-03-04 LAB — AMMONIA: Ammonia: 44 umol/L — ABNORMAL HIGH (ref 9–35)

## 2022-03-04 LAB — HEPATITIS B SURFACE ANTIBODY, QUANTITATIVE: Hep B S AB Quant (Post): <3.1 m[IU]/mL — ABNORMAL LOW (ref >9.9)

## 2022-03-04 NOTE — Progress Notes (Signed)
Pt in HD from approx [phone]

## 2022-03-04 NOTE — Progress Notes (Signed)
PT Cancellation Note  Patient Details Name: April [name] MRN: [id number] DOB: 1940/12/02   Cancelled Treatment:    Reason Eval/Treat Not Completed: Patient at procedure or test/unavailable PT orders received, chart reviewed. Pt noted to be OTF at dialysis. Will f/u as able.  Lavone Nian, PT, DPT 03/04/22, 9:34 AM  Waunita Schooner 03/04/2022, 9:33 AM

## 2022-03-04 NOTE — Plan of Care (Signed)
  Problem: Clinical Measurements: Goal: Diagnostic test results will improve Outcome: Progressing   Problem: Intracerebral Hemorrhage Tissue Perfusion: Goal: Complications of Intracerebral Hemorrhage will be minimized Outcome: Progressing   Problem: Coping: Goal: Will verbalize positive feelings about self Outcome: Progressing   Problem: Coping: Goal: Will identify appropriate support needs Outcome: Progressing   Problem: Self-Care: Goal: Verbalization of feelings and concerns over difficulty with self-care will improve Outcome: Progressing   Problem: Nutrition: Goal: Risk of aspiration will decrease Outcome: Progressing   Problem: Nutrition: Goal: Dietary intake will improve Outcome: Progressing

## 2022-03-04 NOTE — Plan of Care (Signed)
  Problem: Nutrition: Goal: Risk of aspiration will decrease Outcome: Progressing   Problem: Nutrition: Goal: Dietary intake will improve Outcome: Progressing   Problem: Education: Goal: Knowledge of General Education information will improve Description: Including pain rating scale, medication(s)/side effects and non-pharmacologic comfort measures Outcome: Progressing

## 2022-03-04 NOTE — Progress Notes (Signed)
OT Cancellation Note  Patient Details Name: April [name] MRN: [id number] DOB: [birth year]   Cancelled Treatment:    Reason Eval/Treat Not Completed: Other (comment) (pt at HD, OT will reattempt as able.Shanon Payor, OTD OTR/L  03/04/22, 9:48 AM

## 2022-03-04 NOTE — Progress Notes (Signed)
[facility]  Dialysis Note   Subjective:   Seen and examined on hemodialysis treatment.    HEMODIALYSIS FLOWSHEET:  Blood Flow Rate (mL/min): 400 mL/min Arterial Pressure (mmHg): -140 mmHg Venous Pressure (mmHg): 150 mmHg TMP (mmHg): 2 mmHg Ultrafiltration Rate (mL/min): 670 mL/min Dialysate Flow Rate (mL/min): 300 ml/min Dialysis Fluid Bolus: Normal Saline Bolus Amount (mL): 300 mL    Objective:  Vital signs in last 24 hours:  Temp:  [97.7 F (36.5 C)-98.4 F (36.9 C)] 97.8 F (36.6 C) (11/04 0909) Pulse Rate:  [78-103] 89 (11/04 1200) Resp:  [13-23] 14 (11/04 1200) BP: (86-137)/(42-85) 112/48 (11/04 1200) SpO2:  [94 %-99 %] 97 % (11/04 1200)  Weight change:  There were no vitals filed for this visit.  Intake/Output: I/O last 3 completed shifts: In: 1 [P.O.:60] Out: 300 [Other:300]   Intake/Output this shift:  No intake/output data recorded.  Physical Exam: General: NAD,   Head: Normocephalic, atraumatic. Moist oral mucosal membranes  Eyes: Anicteric, PERRL  Neck: Supple, trachea midline  Lungs:  Clear to auscultation  Heart: Regular rate and rhythm  Abdomen:  Soft, nontender,   Extremities:  peripheral edema.  Neurologic: Nonfocal, moving all four extremities  Skin: No lesions  Access:     Basic Metabolic Panel: Recent Labs  Lab 03/02/22 1426  NA 138  K 4.6  CL 103  CO2 25  GLUCOSE 186*  BUN 14  CREATININE 2.65*  CALCIUM 8.2*    Liver Function Tests: Recent Labs  Lab 03/02/22 1426  AST 93*  ALT 40  ALKPHOS 120  BILITOT 2.0*  PROT 5.6*  ALBUMIN 2.2*   No results for input(s): "LIPASE", "AMYLASE" in the last 168 hours. Recent Labs  Lab 03/02/22 1426 03/03/22 0543 03/04/22 0642  AMMONIA 67* 63* 44*    CBC: Recent Labs  Lab 03/02/22 1426  WBC 21.2*  HGB 8.3*  HCT 25.2*  MCV 100.8*  PLT 124*    Cardiac Enzymes: No results for input(s): "CKTOTAL", "CKMB", "CKMBINDEX", "TROPONINI" in the last 168  hours.  BNP: Invalid input(s): "POCBNP"  CBG: No results for input(s): "GLUCAP" in the last 168 hours.  Microbiology: Results for orders placed or performed during the hospital encounter of 02/01/22  Culture, blood (Routine x 2)     Status: None   Collection Time: 02/01/22 11:53 AM   Specimen: BLOOD  Result Value Ref Range Status   Specimen Description BLOOD LEFT ANTECUBITAL  Final   Special Requests   Final    BOTTLES DRAWN AEROBIC AND ANAEROBIC Blood Culture results may not be optimal due to an excessive volume of blood received in culture bottles   Culture   Final    NO GROWTH 5 DAYS Performed at [facility], [address], [place], [place] [address]    Report Status 02/06/2022 FINAL  Final  Culture, blood (Routine x 2)     Status: None   Collection Time: 02/01/22  1:16 PM   Specimen: BLOOD  Result Value Ref Range Status   Specimen Description BLOOD BLOOD LEFT FOREARM  Final   Special Requests BLOOD LEFT ARM Blood Culture adequate volume  Final   Culture   Final    NO GROWTH 5 DAYS Performed at [facility], [address], [place], [place] [address]    Report Status 02/06/2022 FINAL  Final  Resp Panel by RT-PCR (Flu A&B, Covid) Anterior Nasal Swab     Status: None   Collection Time: 02/01/22  1:17 PM  Specimen: Anterior Nasal Swab  Result Value Ref Range Status   SARS Coronavirus 2 by RT PCR NEGATIVE NEGATIVE Final    Comment: (NOTE) SARS-CoV-2 target nucleic acids are NOT DETECTED.  The SARS-CoV-2 RNA is generally detectable in upper respiratory specimens during the acute phase of infection. The lowest concentration of SARS-CoV-2 viral copies this assay can detect is 138 copies/mL. A negative result does not preclude SARS-Cov-2 infection and should not be used as the sole basis for treatment or other patient management decisions. A negative result may occur with  improper specimen collection/handling, submission of specimen other than  nasopharyngeal swab, presence of viral mutation(s) within the areas targeted by this assay, and inadequate number of viral copies(<138 copies/mL). A negative result must be combined with clinical observations, patient history, and epidemiological information. The expected result is Negative.  Fact Sheet for Patients:  EntrepreneurPulse.com.au  Fact Sheet for Healthcare Providers:  IncredibleEmployment.be  This test is no t yet approved or cleared by the [place] FDA and  has been authorized for detection and/or diagnosis of SARS-CoV-2 by FDA under an Emergency Use Authorization (EUA). This EUA will remain  in effect (meaning this test can be used) for the duration of the COVID-19 declaration under Section 564(b)(1) of the Act, 21 U.S.C.section 360bbb-3(b)(1), unless the authorization is terminated  or revoked sooner.       Influenza A by PCR NEGATIVE NEGATIVE Final   Influenza B by PCR NEGATIVE NEGATIVE Final    Comment: (NOTE) The Xpert Xpress SARS-CoV-2/FLU/RSV plus assay is intended as an aid in the diagnosis of influenza from Nasopharyngeal swab specimens and should not be used as a sole basis for treatment. Nasal washings and aspirates are unacceptable for Xpert Xpress SARS-CoV-2/FLU/RSV testing.  Fact Sheet for Patients: EntrepreneurPulse.com.au  Fact Sheet for Healthcare Providers: IncredibleEmployment.be  This test is not yet approved or cleared by the [place] FDA and has been authorized for detection and/or diagnosis of SARS-CoV-2 by FDA under an Emergency Use Authorization (EUA). This EUA will remain in effect (meaning this test can be used) for the duration of the COVID-19 declaration under Section 564(b)(1) of the Act, 21 U.S.C. section 360bbb-3(b)(1), unless the authorization is terminated or revoked.  Performed at [facility], [address], [place], [place]  [address]   Peritoneal fluid culture w Gram Stain     Status: None   Collection Time: 02/01/22  4:59 PM   Specimen: Peritoneal Washings; Peritoneal Fluid  Result Value Ref Range Status   Specimen Description   Final    PERITONEAL Performed at [facility], [address], [place], [place] [address]    Special Requests   Final    NONE Performed at [facility], [place], [place] [address]    Gram Stain NO ORGANISMS SEEN NO WBC SEEN   Final   Culture   Final    NO GROWTH 3 DAYS Performed at [facility], [place] [address], [place], [place] [address]    Report Status 02/05/2022 FINAL  Final  MRSA Next Gen by PCR, Nasal     Status: None   Collection Time: 02/01/22 10:53 PM   Specimen: Nasal Mucosa; Nasal Swab  Result Value Ref Range Status   MRSA by PCR Next Gen NOT DETECTED NOT DETECTED Final    Comment: (NOTE) The GeneXpert MRSA Assay (FDA approved for NASAL specimens only), is one component of a comprehensive MRSA colonization surveillance program. It is not intended to  diagnose MRSA infection nor to guide or monitor treatment for MRSA infections. Test performance is not FDA approved in patients less than 72 years old. Performed at [facility], [address] [place], [place] [address]   C Difficile Quick Screen w PCR reflex     Status: None   Collection Time: 02/02/22  5:58 PM   Specimen: STOOL  Result Value Ref Range Status   C Diff antigen NEGATIVE NEGATIVE Final   C Diff toxin NEGATIVE NEGATIVE Final   C Diff interpretation No C. difficile detected.  Final    Comment: Performed at [facility], [address] [place], [place] [address]  Gastrointestinal Panel by PCR , Stool     Status: None   Collection Time: 02/02/22  5:58 PM   Specimen: Stool  Result Value Ref Range Status   Campylobacter species NOT DETECTED NOT DETECTED Final   Plesimonas shigelloides NOT DETECTED NOT DETECTED Final   Salmonella species  NOT DETECTED NOT DETECTED Final   Yersinia enterocolitica NOT DETECTED NOT DETECTED Final   Vibrio species NOT DETECTED NOT DETECTED Final   Vibrio cholerae NOT DETECTED NOT DETECTED Final   Enteroaggregative E coli (EAEC) NOT DETECTED NOT DETECTED Final   Enteropathogenic E coli (EPEC) NOT DETECTED NOT DETECTED Final   Enterotoxigenic E coli (ETEC) NOT DETECTED NOT DETECTED Final   Shiga like toxin producing E coli (STEC) NOT DETECTED NOT DETECTED Final   Shigella/Enteroinvasive E coli (EIEC) NOT DETECTED NOT DETECTED Final   Cryptosporidium NOT DETECTED NOT DETECTED Final   Cyclospora cayetanensis NOT DETECTED NOT DETECTED Final   Entamoeba histolytica NOT DETECTED NOT DETECTED Final   Giardia lamblia NOT DETECTED NOT DETECTED Final   Adenovirus F40/41 NOT DETECTED NOT DETECTED Final   Astrovirus NOT DETECTED NOT DETECTED Final   Norovirus GI/GII NOT DETECTED NOT DETECTED Final   Rotavirus A NOT DETECTED NOT DETECTED Final   Sapovirus (I, II, IV, and V) NOT DETECTED NOT DETECTED Final    Comment: Performed at [facility], [address] [place], [place] [address]  Peritoneal fluid culture w Gram Stain     Status: None   Collection Time: 02/03/22  2:42 PM   Specimen: Peritoneal Washings; Peritoneal Fluid  Result Value Ref Range Status   Specimen Description   Final    PERITONEAL Performed at [facility], [address], [place], [place] [address]    Special Requests   Final    NONE Performed at [facility], [place],  [address]    Gram Stain NO WBC SEEN NO ORGANISMS SEEN   Final   Culture   Final    NO GROWTH 3 DAYS Performed at [facility], [place] [address] [place],  [address]    Report Status 02/07/2022 FINAL  Final    Coagulation Studies: Recent Labs    03/02/22 1551  LABPROT 17.1*  INR 1.4*    Urinalysis: Recent Labs    03/02/22 1817  COLORURINE AMBER*  LABSPEC 1.020  PHURINE 5.0   GLUCOSEU NEGATIVE  HGBUR NEGATIVE  BILIRUBINUR NEGATIVE  KETONESUR 5*  PROTEINUR NEGATIVE  NITRITE NEGATIVE  LEUKOCYTESUR NEGATIVE      Imaging: CT HEAD WO CONTRAST (5MM)  Result Date: 03/03/2022 CLINICAL DATA:  Mental status change, subdural hematoma. 6 hour follow-up from yesterday. EXAM: CT HEAD WITHOUT CONTRAST TECHNIQUE: Contiguous axial images were obtained from the base of the skull through the vertex without intravenous contrast. RADIATION DOSE REDUCTION:  This exam was performed according to the departmental dose-optimization program which includes automated exposure control, adjustment of the mA and/or kV according to patient size and/or use of iterative reconstruction technique. COMPARISON:  CT yesterday at 5:45 p.m. FINDINGS: Brain: Images of the current study were completed 12:06 a.m., 03/03/2022. Again noted is a 7 mm in thickness lateral left frontotemporal convexity subdural hematoma, and a 3 mm in thickness subdural bleed along the superior aspect of the left leaf of the tentorium. There has been no significant interval change. There is slight underlying left frontotemporal gyral crowding and no more than 2 mm of left-to-right midline shift of the septum pellucidum. There is no downward mass effect. There is mild cerebellar atrophy and mild-to-moderate cerebral atrophy and atrophic ventriculomegaly with mild small-vessel disease in the cerebral white matter. Basal cisterns are clear. No cortical based infarct, parenchymal bleed or mass is seen. Vascular: There are calcifications in the distal vertebral arteries and carotid siphons. No hyperdense central vessels. Skull: Negative for fractures or focal lesions. Sinuses/Orbits: No acute finding. No mastoid effusion or new sinus disease. Small retention cyst left sphenoid air cell. Other: None. IMPRESSION: 1. Stable 7 mm in thickness left frontotemporal convexity subdural hematoma and 3 mm in thickness left tentorial subdural bleed. No new  or worsening hemorrhage. 2. 2 mm left-to-right midline shift of the septum pellucidum, no downward mass effect. 3. Atrophy and small-vessel disease. Electronically Signed   By: Telford Nab M.D.   On: 03/03/2022 00:20   CT HEAD WO CONTRAST (5MM)  Result Date: 03/02/2022 CLINICAL DATA:  [place] EXAM: CT HEAD WITHOUT CONTRAST TECHNIQUE: Contiguous axial images were obtained from the base of the skull through the vertex without intravenous contrast. RADIATION DOSE REDUCTION: This exam was performed according to the departmental dose-optimization program which includes automated exposure control, adjustment of the mA and/or kV according to patient size and/or use of iterative reconstruction technique. COMPARISON:  MRI head 04/26/2017. FINDINGS: Brain: Next density subdural hemorrhage along the left cerebral convexity, measuring up to 8 mm in thickness. Also, trace acute hemorrhage layering along the left tentorial leaflet. Trace resulting rightward midline shift. No evidence of acute large vascular territory infarct, mass lesion or hydrocephalus. Mild for age cerebral atrophy with ex vacuo ventricular dilation. Vascular: No hyperdense vessel identified. Skull: No acute fracture. Sinuses/Orbits: Largely clear sinuses.  No acute orbital findings. Other: No mastoid effusions. IMPRESSION: Mixed density subdural hemorrhage along the left cerebral convexity and extending along the left tentorial leaflet, measuring up to 8 mm in thickness. Intermixed hyperdense component is compatible with acute/recent hemorrhage. Trace resulting rightward midline shift. Findings discussed with Dr. Ellender Hose via telephone at 6:22 p.m. Electronically Signed   By: Margaretha Sheffield M.D.   On: 03/02/2022 18:23   DG Chest 2 View  Result Date: 03/02/2022 CLINICAL DATA:  Weakness. EXAM: CHEST - 2 VIEW COMPARISON:  February 01, 2022. FINDINGS: Stable cardiomediastinal silhouette. Right internal jugular catheter is unchanged in position. Mild  bilateral pleural effusions are noted with associated atelectasis which is increased compared to prior exam. Stable hiatal hernia. Bony thorax is unremarkable. IMPRESSION: Increased bilateral pleural effusions are noted with associated atelectasis. Electronically Signed   By: Marijo Conception M.D.   On: 03/02/2022 16:40     Medications:    anticoagulant sodium citrate       stroke: early stages of recovery book   Does not apply Once   amiodarone  200 mg Oral Daily   Chlorhexidine Gluconate Cloth  6  each Topical Q0600   lactulose  30 g Oral TID   midodrine  10 mg Oral TID WC   multivitamin with minerals  1 tablet Oral Daily   pantoprazole (PROTONIX) IV  40 mg Intravenous QHS   rifaximin  550 mg Oral BID   senna-docusate  1 tablet Oral BID   torsemide  80 mg Oral Daily   acetaminophen **OR** acetaminophen (TYLENOL) oral liquid 160 mg/5 mL **OR** acetaminophen, alteplase, anticoagulant sodium citrate, heparin, labetalol, lidocaine (PF), lidocaine-prilocaine, magnesium hydroxide, metoCLOPramide (REGLAN) injection, nitroGLYCERIN, pentafluoroprop-tetrafluoroeth  Assessment/ Plan:  April [name] is a 81 y.o.  female with a past history of end-stage renal disease on dialysis. She also has a past medical history of hypertension, coronary artery disease, atrial fibrillation, obstructive sleep apnea, diabetes, peripheral vascular disease, she is now admitted with history of mental status changes.  Principal Problem:   Subdural hemorrhage (HCC) Active Problems:   Paroxysmal atrial fibrillation (HCC)   Acute hepatic encephalopathy (HCC)   End-stage renal disease on hemodialysis ([place])   GERD without esophagitis     End Stage Renal Disease on hemodialysis: On a TTS schedule.  Tolerating dialysis.  Intake/Output Summary (Last 24 hours) at 03/04/2022 1251 Last data filed at 03/03/2022 2045 Gross per 24 hour  Intake 60 ml  Output 300 ml  Net -240 ml    2. Hypertension with chronic  kidney disease: Continue present management.  Advised on fluid restriction.   BP (!) 112/48 (BP Location: Left Arm)   Pulse 89   Temp 97.8 F (36.6 C) (Oral)   Resp 14   LMP 05/01/1972   SpO2 97%   3. Anemia of chronic kidney disease/ kidney injury/chronic disease/acute blood loss:   Lab Results  Component Value Date   HGB 8.3 (L) 03/02/2022   We will continue anemia protocol.  4. Secondary Hyperparathyroidism:     Lab Results  Component Value Date   PTH 32 02/03/2022   CALCIUM 8.2 (L) 03/02/2022   CAION 1.17 03/25/2016   PHOS 3.4 02/07/2022   #5: Subdural hematoma: Patient is now being evaluated by neurosurgery.    We will continue to monitor closely.    LOS: [place], MD [facility] 11/4/202312:51 PM

## 2022-03-04 NOTE — Progress Notes (Signed)
  Progress Note   Patient: April [name] KDX:[id number] DOB: 1941/03/31 DOA: 03/02/2022     1 DOS: the patient was seen and examined on 03/04/2022   Brief hospital course: Patient is an 81 year old female with past medical history of end-stage renal disease, CAD status post angioplasty, diabetes mellitus, paroxysmal atrial fibrillation on Eliquis, sleep apnea, diastolic heart failure and [name] cirrhosis who has been noncompliant with her lactulose and presented to the emergency room on 11/2 with confusion.  During work-up, patient also found to have a left-sided subdural hematoma with small left to right midline shift.  Repeat scan on the early morning of 11/3 noted no change.  Patient was admitted to the hospitalist service and neurosurgery consulted.  Neurosurgery felt given patient's major medical comorbidities, not candidate for surgical intervention.  It was also felt that subdural hematoma was slightly subacute and predated change in mental status.  Patient noted to have elevated ammonia levels which were more likely felt to be because of mentation and GI consulted.  Assessment and Plan: * Subdural hemorrhage ([place] at home secondary to altered mental status. Hepatic encephalopathy with altered mental status. Patient had a fall at home resulting in subdural hematoma.  Has been seen by neurosurgery, no surgical interventions required. Patient mental status has been improving since starting lactulose. Ordered PT/OT, patient may need nursing home placement.   GERD without esophagitis PPI.  End-stage renal disease on hemodialysis ([place]) Followed by nephrology for dialysis.  Paroxysmal atrial fibrillation (HCC) On amiodarone.  Appears in sinus.       Subjective:  Patient still has significant weakness, mental status improved.  No shortness of breath.  Physical Exam: Vitals:   03/04/22 1200 03/04/22 1230 03/04/22 1257 03/04/22 1404  BP: (!) 112/48 (!) 86/58 (!) 120/57 (!) 115/48   Pulse: 89 91 87 92  Resp: '14 16 15 16  '$ Temp:   97.8 F (36.6 C) 97.6 F (36.4 C)  TempSrc:   Oral Oral  SpO2: 97% 97% 97% 94%   General exam: Appears calm and comfortable  Respiratory system: Clear to auscultation. Respiratory effort normal. Cardiovascular system: S1 & S2 heard, RRR. No JVD, murmurs, rubs, gallops or clicks. No pedal edema. Gastrointestinal system: Abdomen is nondistended, soft and nontender. No organomegaly or masses felt. Normal bowel sounds heard. Central nervous system: Alert and oriented x2. No focal neurological deficits. Extremities: Symmetric 5 x 5 power. Skin: No rashes, lesions or ulcers Psychiatry:  Mood & affect appropriate.   Data Reviewed:  Reviewed the CT scan results, reviewed lab results.  Family Communication: Daughter-in-law updated at the bedside.  Disposition: Status is: Inpatient Remains inpatient appropriate because: Severity of disease, unsafe discharge.  Planned Discharge Destination: Skilled nursing facility    Time spent: 35 minutes  Author: Sharen Hones, MD 03/04/2022 3:49 PM  For on call review [email or web address].

## 2022-03-04 NOTE — Progress Notes (Signed)
SLP Cancellation Note  Patient Details Name: April [name] MRN: [id number] DOB: 06-27-1940   Cancelled treatment:       Reason Eval/Treat Not Completed: Patient at procedure or test/unavailable  Pt noted to be OTF at dialysis. Will f/u as able.   Tayvian Holycross B. Rutherford Nail, M.S., CCC-SLP, Mining engineer Certified Brain Injury [place]  [place] Office [phone] Ascom [phone] Fax [phone]

## 2022-03-04 NOTE — Progress Notes (Signed)
PT did 3.5 hrs of HD, tolerated well  UF = 1000    03/04/22 1257  Vitals  Temp 97.8 F (36.6 C)  Temp Source Oral  BP (!) 120/57  MAP (mmHg) 76  BP Location Left Arm  BP Method Automatic  Patient Position (if appropriate) Lying  Pulse Rate 87  Pulse Rate Source Monitor  ECG Heart Rate 86  Resp 15  Oxygen Therapy  SpO2 97 %  O2 Device Room Air  Patient Activity (if Appropriate) In bed  Pulse Oximetry Type Continuous  During Treatment Monitoring  Intra-Hemodialysis Comments Tolerated well;Tx completed  Post Treatment  Dialyzer Clearance Lightly streaked  Duration of HD Treatment -hour(s) 3.5 hour(s)  Liters Processed 84  Fluid Removed 1 mL  Tolerated HD Treatment Yes  Hemodialysis Catheter Right Internal jugular Double lumen Permanent (Tunneled)  Placement Date/Time: 12/15/21 1059   Time Out: Correct patient;Correct procedure;Correct site  Maximum sterile barrier precautions: Hand hygiene;Sterile gloves;Cap;Large sterile sheet;Mask;Sterile gown  Site Prep: Chlorhexidine (preferred)  Local Anes...  Site Condition No complications  Blue Lumen Status Blood return noted;Flushed;Dead end cap in place;Other (Comment) (Sodium citrate locked)  Red Lumen Status Blood return noted;Flushed;Dead end cap in place;Other (Comment) (Sodium citrate locked)  Catheter fill volume (Arterial) 2 cc  Catheter fill volume (Venous) 2.2  Dressing Type Transparent  Dressing Status Antimicrobial disc in place  Drainage Description None  Post treatment catheter status Capped and Clamped

## 2022-03-05 ENCOUNTER — Inpatient Hospital Stay: Payer: Medicare Other

## 2022-03-05 LAB — AMMONIA: Ammonia: 37 umol/L — ABNORMAL HIGH (ref 9–35)

## 2022-03-05 MED ORDER — PANTOPRAZOLE SODIUM 40 MG PO TBEC
40.0000 mg | DELAYED_RELEASE_TABLET | Freq: Every day | ORAL | Status: DC
  Administered 2022-03-05: 40 mg via ORAL
  Filled 2022-03-05: qty 1

## 2022-03-05 MED ORDER — SIMETHICONE 80 MG PO CHEW: 80.0000 mg | CHEWABLE_TABLET | Freq: Four times a day (QID) | ORAL | Status: DC | PRN

## 2022-03-05 NOTE — Evaluation (Signed)
Occupational Therapy Evaluation Patient Details Name: April [name] MRN: [id number] DOB: 1940/12/18 Today's Date: 03/05/2022   History of Present Illness Pt is an 81 year old female presenting to the ED with confusion, during work up pt found to have left-sided subdural hematoma with small left to right midline shift. Noted that subdural hematoma was slightly subacute and predated change in mental status.  Patient noted to have elevated ammonia levels which were more likely felt to be because of mentation and GI consulted  PMH significant for end-stage renal disease, CAD status post angioplasty, diabetes mellitus, paroxysmal atrial fibrillation on Eliquis, sleep apnea, diastolic heart failure and [place] cirrhosis   Clinical Impression   Chart reviewed, pt greeted in room agreeable to OT evaluation. Co tx completed with Pt on this date. Pt is oriented to self, place, grossly oriented to situation (not 100% accuracy on report vs chart review), not oriented to date. PTA pt has been receiving 24/7 care at [place], reports limited mobility since previous discharge with assist for ADL/IADL. Pt presents with deficits in strength, endurance, activity tolerance, balance all affecting safe and optimal ADL completion. Poor sitting balance while seated on edge of bed, with poor standing balance. MOD A +2 required for step pivot transfer to bedside chair with MAX A required for peri care following BM. SET Up required for grooming tasks. At this time recommend discharge to STR to address functional deficits. OT will continue to follow acutely.      Recommendations for follow up therapy are one component of a multi-disciplinary discharge planning process, led by the attending physician.  Recommendations may be updated based on patient status, additional functional criteria and insurance authorization.   Follow Up Recommendations  Skilled nursing-short term rehab (<3 hours/day)    Assistance Recommended at Discharge  Frequent or constant Supervision/Assistance  Patient can return home with the following A lot of help with bathing/dressing/bathroom;A lot of help with walking and/or transfers    Functional Status Assessment  Patient has had a recent decline in their functional status and demonstrates the ability to make significant improvements in function in a reasonable and predictable amount of time.  Equipment Recommendations  Other (comment) (defer)    Recommendations for Other Services       Precautions / Restrictions Precautions Precautions: Fall Restrictions Weight Bearing Restrictions: No      Mobility Bed Mobility Overal bed mobility: Needs Assistance Bed Mobility: Supine to Sit     Supine to sit: Max assist, +2 for physical assistance, HOB elevated          Transfers Overall transfer level: Needs assistance Equipment used: Rolling walker (2 wheels) Transfers: Sit to/from Stand Sit to Stand: Mod assist, +2 physical assistance                  Balance Overall balance assessment: Needs assistance Sitting-balance support: Bilateral upper extremity supported, Feet unsupported, Feet supported Sitting balance-Leahy Scale: Poor Sitting balance - Comments: support required throughout Postural control: Posterior lean Standing balance support: During functional activity, Bilateral upper extremity supported, Reliant on assistive device for balance Standing balance-Leahy Scale: Poor                             ADL either performed or assessed with clinical judgement   ADL Overall ADL's : Needs assistance/impaired Eating/Feeding: Set up;Sitting   Grooming: Set up;Sitting;Wash/dry face  Lower Body Dressing: Maximal assistance   Toilet Transfer: +2 for safety/equipment;Rolling walker (2 wheels);Moderate assistance Toilet Transfer Details (indicate cue type and reason): step pivot to bedside chair Toileting- Clothing Manipulation and Hygiene:  Maximal assistance;+2 for physical assistance;+2 for safety/equipment;Sit to/from stand Toileting - Clothing Manipulation Details (indicate cue type and reason): following BM             Vision Patient Visual Report: No change from baseline       Perception     Praxis      Pertinent Vitals/Pain Pain Assessment Pain Assessment: Faces Faces Pain Scale: Hurts little more Pain Location: L side of abdomen underneath breast Pain Descriptors / Indicators: Sore Pain Intervention(s): Monitored during session, Repositioned (RN present)     Hand Dominance Right   Extremity/Trunk Assessment Upper Extremity Assessment Upper Extremity Assessment: Generalized weakness   Lower Extremity Assessment Lower Extremity Assessment: Generalized weakness       Communication Communication Communication:  (broken speech)   Cognition Arousal/Alertness: Awake/alert Behavior During Therapy: WFL for tasks assessed/performed Overall Cognitive Status: Impaired/Different from baseline Area of Impairment: Orientation, Attention, Following commands, Awareness, Problem solving                 Orientation Level: Disoriented to, Time, Situation (able to state her ammonia levels are high however not providing completely concurrent info as per chart) Current Attention Level: Sustained   Following Commands: Follows one step commands consistently   Awareness: Emergent Problem Solving: Slow processing, Requires verbal cues, Requires tactile cues General Comments: will continue to assess     General Comments  pt complained of SOB with positional changes, spo2 .90% on RA, HR 94-100 while seated on edge of bed; RN present throughout    Exercises     Shoulder Instructions      Home Living Family/patient expects to be discharged to:: Private residence ([facility]) Living Arrangements: Alone Available Help at Discharge: Family;Personal care attendant;Available 24 hours/day Type of  Home: House Home Access: Level entry     Home Layout: One level     Bathroom Shower/Tub: Occupational psychologist: Standard     Home Equipment: Grab bars - tub/shower;Rolling Environmental consultant (2 wheels);Rollator (4 wheels);Cane - single point;Shower seat   Additional Comments: 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 [place], family pays for 24/7 caregivers      Prior Functioning/Environment               Mobility Comments: pt reports since last admission, typically utilizing mwc, minimal amb ADLs Comments: assist for all ADLs since previous admission, prior to that set up assist; assist for all IADLs        OT Problem List: Decreased strength;Decreased activity tolerance;Impaired balance (sitting and/or standing);Decreased knowledge of use of DME or AE      OT Treatment/Interventions: Self-care/ADL training;Therapeutic activities;Therapeutic exercise;Energy conservation;Patient/family education    OT Goals(Current goals can be found in the care plan section) Acute Rehab OT Goals Patient Stated Goal: go to rehab to get stronger OT Goal Formulation: With patient Time For Goal Achievement: 2022-04-14 Potential to Achieve Goals: Good ADL Goals Pt Will Perform Grooming: with supervision;sitting Pt Will Perform Lower Body Dressing: with min assist Pt Will Transfer to Toilet: with supervision Pt Will Perform Toileting - Clothing Manipulation and hygiene: with supervision  OT Frequency: Min 2X/week    Co-evaluation PT/OT/SLP Co-Evaluation/Treatment: Yes Reason for Co-Treatment: For patient/therapist safety;Complexity of the patient's impairments (multi-system involvement);To address functional/ADL transfers PT goals addressed during session: Mobility/safety  with mobility;Balance;Proper use of DME OT goals addressed during session: ADL's and self-care      AM-PAC OT "6 Clicks" Daily Activity     Outcome Measure Help from another person eating meals?: A Little Help from another person  taking care of personal grooming?: A Little Help from another person toileting, which includes using toliet, bedpan, or urinal?: A Lot Help from another person bathing (including washing, rinsing, drying)?: A Lot Help from another person to put on and taking off regular upper body clothing?: A Lot Help from another person to put on and taking off regular lower body clothing?: A Lot 6 Click Score: 14   End of Session Equipment Utilized During Treatment: Rolling walker (2 wheels) Nurse Communication: Mobility status  Activity Tolerance: Patient tolerated treatment well Patient left: in chair;with call bell/phone within reach;with chair alarm set  OT Visit Diagnosis: Unsteadiness on feet (R26.81);Muscle weakness (generalized) (M62.81)                Time: 3299-2426 OT Time Calculation (min): 26 min Charges:  OT General Charges $OT Visit: 1 Visit OT Evaluation $OT Eval Moderate Complexity: 1 Mod  Shanon Payor, OTD OTR/L  03/05/22, 10:01 AM

## 2022-03-05 NOTE — Progress Notes (Signed)
Speech Language Pathology Treatment: Dysphagia  Patient Details Name: April [name] MRN: [id number] DOB: 06-Aug-1940 Today's Date: 03/05/2022 Time: 2876-8115 SLP Time Calculation (min) (ACUTE ONLY): 20 min  Assessment / Plan / Recommendation Clinical Impression  Pt seen for follow up dysphagia intervention. Pt currently on Dys 1 (puree solids) and thin liquids. Pt completing trials of thin liquids, puree solids, soft solid equivalent, and regular solids. No overt or subtle s/sx pharyngeal dysphagia noted. No change to vocal quality across trials. Vitals stable for duration of trials (O2 saturations ranging 94-96). Oral phase mildly more prolonged for regular solids, requiring liquid wash x2 to eliminate oral residue. Oral phase efficient and complete with soft solids without need for liquid wash. Education shared with pt regarding energy conservation and potential negative impact of regular solids on endurance.   Recommend advancement to Dys 3 (soft solids) and thin liquids with strict aspiration precautions (slow rate, small bites, elevated HOB, alert for PO intake), intermittent supervision for PO intake, set up for meals, and monitoring for change in respiratory status/endurance with advanced solids. Continue to recommend esophageal considerations: HOB elevated to 90 degrees for at least 60 min following intake and elevated HOB when sleeping.   MD and RN aware and in agreement with recommendations. Diet sign updated in patient's room. SLP will follow up to determine stability with updated diet      HPI HPI: Pt is a 81 y.o. Caucasian female with medical history of Multiple medical problems including CKD, DM, CHF, Migraines, Large Hiatal Hernia, HTN, CAD, Spasmodic Dysphonia and Liver Cirrhosis, on HD who presented to the emergency room with acute onset of confusion and hypotension while having her hemodialysis session on Thursday.  Per her son, she has been fairly confused since Monday of this  week but was able to finish hemodialysis on Tuesday.  Her confusion worsened since the night before dialysis.  She did not have any reported chest pain or dyspnea or cough or wheezing.  No fever or chills.  No nausea or vomiting or abdominal pain.  She has not been taking her lactulose regularly due to frequent bowel movements with it.  No headache or dizziness or blurred vision.  No paresthesias or focal muscle weakness.  IMAGING: CT A/P 01/2022--  1. Cirrhotic liver with moderate volume ascites.  2. Small left and trace right pleural effusions with associated  compressive atelectasis.  3. Moderate-large hiatal hernia containing the majority of the  stomach within the chest.  4. Left-sided colonic diverticulosis without evidence for acute  diverticulitis.  5. Anasarca.  Head CT: Head ct: Stable 7 mm in thickness left frontotemporal convexity subdural  hematoma and 3 mm in thickness left tentorial subdural bleed. No new  or worsening hemorrhage.  2. 2 mm left-to-right midline shift of the septum pellucidum, no  downward mass effect.  3. Atrophy and small-vessel disease.  MD admitting dx included: Acute hepatic encephalopathy ([place])  - This is likely the culprit for her altered mental status and confusion rather than her subdural hematoma.  - It is likely due to noncompliance with lactulose.  - She will be placed on lactulose and rifaximin.      SLP Plan  Continue with current plan of care  Patient needs continued Speech Lanaguage Pathology Services   Recommendations for follow up therapy are one component of a multi-disciplinary discharge planning process, led by the attending physician.  Recommendations may be updated based on patient status, additional functional criteria and insurance authorization.  Recommendations  Diet recommendations: Dysphagia 3 (mechanical soft);Thin liquid Liquids provided via: Straw;Cup Medication Administration: Whole meds with puree Supervision: Intermittent supervision  to cue for compensatory strategies Compensations: Minimize environmental distractions;Slow rate;Small sips/bites Postural Changes and/or Swallow Maneuvers: Upright 30-60 min after meal;Seated upright 90 degrees                Oral Care Recommendations: Oral care BID Follow Up Recommendations: Skilled nursing-short term rehab (<3 hours/day) Assistance recommended at discharge: Frequent or constant Supervision/Assistance SLP Visit Diagnosis: Dysphagia, unspecified (R13.10);Cognitive communication deficit (R41.841) Plan: Continue with current plan of care          [place] Niraj Kudrna  MS [facility] SLP   [place] J Clapp  03/05/2022, 6:06 PM

## 2022-03-05 NOTE — Progress Notes (Signed)
  Progress Note   Patient: April [name] NTI:[id number] DOB: 12-09-1940 DOA: 03/02/2022     2 DOS: the patient was seen and examined on 03/05/2022   Brief hospital course: Patient is an 81 year old female with past medical history of end-stage renal disease, CAD status post angioplasty, diabetes mellitus, paroxysmal atrial fibrillation on Eliquis, sleep apnea, diastolic heart failure and [name] cirrhosis who has been noncompliant with her lactulose and presented to the emergency room on 11/2 with confusion.  During work-up, patient also found to have a left-sided subdural hematoma with small left to right midline shift.  Repeat scan on the early morning of 11/3 noted no change.  Patient was admitted to the hospitalist service and neurosurgery consulted.  Neurosurgery felt given patient's major medical comorbidities, not candidate for surgical intervention.  It was also felt that subdural hematoma was slightly subacute and predated change in mental status.  Patient noted to have elevated ammonia levels which were more likely felt to be because of mentation and GI consulted.  Assessment and Plan:  Subdural hemorrhage ([place] Fall at home secondary to altered mental status. Hepatic encephalopathy with altered mental status. Patient had a fall at home resulting in subdural hematoma.  Has been seen by neurosurgery, no surgical interventions required. Mental status had improved.  Recheck a CT scan to make sure subdural hematoma is getting worse. Ammonia level is better, continue lactulose.  Recheck electrolytes. Patient still has signal weakness, PT/OT recommend nursing placement.     GERD without esophagitis PPI.   End-stage renal disease on hemodialysis ([place]) Followed by nephrology for dialysis.   Paroxysmal atrial fibrillation (HCC) On amiodarone.  Appears in sinus.      Subjective:  Patient doing well, no complaints today.  Physical Exam: Vitals:   03/04/22 2319 03/05/22 0502 03/05/22 0743  03/05/22 1139  BP: (!) 104/45 (!) 95/46 125/73 (!) 108/50  Pulse: 93 83 85 80  Resp: '20 20 18 18  '$ Temp: 98.7 F (37.1 C) 98.5 F (36.9 C) 98.3 F (36.8 C) 98.2 F (36.8 C)  TempSrc: Oral Oral    SpO2: 95% 92% 95% 98%   General exam: Appears calm and comfortable  Respiratory system: Clear to auscultation. Respiratory effort normal. Cardiovascular system: S1 & S2 heard, RRR. No JVD, murmurs, rubs, gallops or clicks. No pedal edema. Gastrointestinal system: Abdomen is nondistended, soft and nontender. No organomegaly or masses felt. Normal bowel sounds heard. Central nervous system: Alert and oriented. No focal neurological deficits. Extremities: Symmetric 5 x 5 power. Skin: No rashes, lesions or ulcers Psychiatry: Judgement and insight appear normal. Mood & affect appropriate.   Data Reviewed:  Results reviewed.  Family Communication: None  Disposition: Status is: Inpatient Remains inpatient appropriate because: Unsafe to discharge, pending nursing placement.  Planned Discharge Destination: Skilled nursing facility    Time spent: 30 minutes  Author: Sharen Hones, MD 03/05/2022 3:03 PM  For on call review [email or web address].

## 2022-03-05 NOTE — Progress Notes (Signed)
PHARMACIST - PHYSICIAN COMMUNICATION  DR:   Roosevelt Locks  CONCERNING: IV to Oral Route Change Policy  RECOMMENDATION: This patient is receiving pantoprazole by the intravenous route.  Based on criteria approved by the Pharmacy and Therapeutics Committee, the intravenous medication(s) is/are being converted to the equivalent oral dose form(s).   DESCRIPTION: These criteria include: The patient is eating (either orally or via tube) and/or has been taking other orally administered medications for a least 24 hours The patient has no evidence of active gastrointestinal bleeding or impaired GI absorption (gastrectomy, short bowel, patient on TNA or NPO).  If you have questions about this conversion, please contact the Pharmacy Department  '[]'$   [phone] )  [facility] '[x]'$   [phone] )  [facility] '[]'$   [phone] )  [facility] '[]'$   [phone] )  [facility] '[]'$   [phone] )  [place], [facility] 03/05/2022 8:25 AM

## 2022-03-05 NOTE — Progress Notes (Signed)
  [facility]  PROGRESS NOTE   Subjective:   Patient seen at bedside today.  Ate breakfast.  Objective:  Vital signs: Blood pressure (!) 108/50, pulse 80, temperature 98.2 F (36.8 C), resp. rate 18, last menstrual period 05/01/1972, SpO2 98 %.  Intake/Output Summary (Last 24 hours) at 03/05/2022 1306 Last data filed at 03/05/2022 0900 Gross per 24 hour  Intake 240 ml  Output --  Net 240 ml   There were no vitals filed for this visit.   Physical Exam: General:  No acute distress  Head:  Normocephalic, atraumatic. Moist oral mucosal membranes  Eyes:  Anicteric  Neck:  Supple  Lungs:   Clear to auscultation, normal effort  Heart:  S1S2 no rubs  Abdomen:   Soft, nontender, bowel sounds present  Extremities:  peripheral edema.  Neurologic:  Awake, alert, following commands  Skin:  No lesions  Access:     Basic Metabolic Panel: Recent Labs  Lab 03/02/22 1426  NA 138  K 4.6  CL 103  CO2 25  GLUCOSE 186*  BUN 14  CREATININE 2.65*  CALCIUM 8.2*    CBC: Recent Labs  Lab 03/02/22 1426  WBC 21.2*  HGB 8.3*  HCT 25.2*  MCV 100.8*  PLT 124*     Urinalysis: Recent Labs    03/02/22 1817  COLORURINE AMBER*  LABSPEC 1.020  PHURINE 5.0  GLUCOSEU NEGATIVE  HGBUR NEGATIVE  BILIRUBINUR NEGATIVE  KETONESUR 5*  PROTEINUR NEGATIVE  NITRITE NEGATIVE  LEUKOCYTESUR NEGATIVE      Imaging: No results found.   Medications:     amiodarone  200 mg Oral Daily   Chlorhexidine Gluconate Cloth  6 each Topical Q0600   lactulose  30 g Oral TID   midodrine  10 mg Oral TID WC   multivitamin with minerals  1 tablet Oral Daily   pantoprazole  40 mg Oral QHS   rifaximin  550 mg Oral BID   senna-docusate  1 tablet Oral BID   torsemide  80 mg Oral Daily    Assessment/ Plan:     Principal Problem:   Subdural hemorrhage (HCC) Active Problems:   Paroxysmal atrial fibrillation (HCC)   Acute hepatic encephalopathy (HCC)   End-stage renal disease on  hemodialysis (HCC)   GERD without esophagitis  [name] is a 81 y.o.  female with a past history of end-stage renal disease on dialysis. She also has a past medical history of hypertension, coronary artery disease, atrial fibrillation, obstructive sleep apnea, diabetes, peripheral vascular disease, she is now admitted with history of mental status changes.  #1: ESRD: Patient had stable dialysis yesterday.  Next dialysis scheduled for Tuesday.  #2: Subdural hematoma/hemorrhage: Patient had mental status changes possibly possibly secondary to fall leading to subdural hematoma.  #3: Paroxysmal atrial fibrillation: Patient is on amiodarone.  We will follow.   LOS: [place], MD [facility] 11/5/20231:06 PM

## 2022-03-05 NOTE — Evaluation (Signed)
Physical Therapy Evaluation Patient Details Name: April [name] MRN: [id number] DOB: 1941-04-01 Today's Date: 03/05/2022  History of Present Illness  Pt is an 81 y/o F admitted on 03/02/22 after presenting with c/o confusion. Pt found to have L SDH with small L>R midline shift. Neurosurgery was consulted & felt pt was not a surgical intervention due to her major medical comorbidities. It was also felt that SDH was slightly subacute & predated change in mental status. PMH: ESRD, CAD s/p angioplasty, DM, paroxysmal a-fib on eliquis, sleep apnea, diastolic heart failure & nash cirrhosis   Clinical Impression  Pt seen for PT evaluation with co-tx with OT.  Pt received in bed & agreeable to tx. Pt reports she still resided in [facility] & has 24 hr paid caregivers. Pt reports she's primarily transferring only, and has only ambulated once with assistance/HHPT. On this date, pt requires max assist +2 for supine>sit, mod assist +2 for STS & step pivot with RW. Pt demonstrates posterior lean & generalized weakness throughout all mobility. Pt would benefit from STR upon d/c to increase independence with functional mobility, decrease caregiver burden, & decrease fall risk. Will continue to follow pt acutely to address balance, strengthening, activity tolerance, and gait with LRAD.    Recommendations for follow up therapy are one component of a multi-disciplinary discharge planning process, led by the attending physician.  Recommendations may be updated based on patient status, additional functional criteria and insurance authorization.  Follow Up Recommendations Skilled nursing-short term rehab (<3 hours/day) Can patient physically be transported by private vehicle: No    Assistance Recommended at Discharge Frequent or constant Supervision/Assistance  Patient can return home with the following  A lot of help with bathing/dressing/bathroom;Assist for transportation;Two people to help with walking and/or  transfers;Assistance with cooking/housework;Help with stairs or ramp for entrance;Direct supervision/assist for financial management    Equipment Recommendations None recommended by PT (TBD in next venue)  Recommendations for Other Services       Functional Status Assessment Patient has had a recent decline in their functional status and demonstrates the ability to make significant improvements in function in a reasonable and predictable amount of time.     Precautions / Restrictions Precautions Precautions: Fall Restrictions Weight Bearing Restrictions: No      Mobility  Bed Mobility Overal bed mobility: Needs Assistance Bed Mobility: Supine to Sit     Supine to sit: Max assist, +2 for physical assistance, HOB elevated     General bed mobility comments: assistance to move BLE to EOB & upright trunk    Transfers Overall transfer level: Needs assistance Equipment used: Rolling walker (2 wheels) Transfers: Sit to/from Stand, Bed to chair/wheelchair/BSC Sit to Stand: Mod assist, +2 physical assistance   Step pivot transfers: Mod assist, +2 physical assistance       General transfer comment: Pt requires cuing for safe hand placement during STS with RW, assistance for balance.    Ambulation/Gait                  Stairs            Wheelchair Mobility    Modified Rankin (Stroke Patients Only)       Balance Overall balance assessment: Needs assistance Sitting-balance support: Bilateral upper extremity supported, Feet unsupported, Feet supported Sitting balance-Leahy Scale: Poor   Postural control: Posterior lean Standing balance support: During functional activity, Bilateral upper extremity supported, Reliant on assistive device for balance Standing balance-Leahy Scale: Poor  Pertinent Vitals/Pain Pain Assessment Pain Assessment: Faces Faces Pain Scale: Hurts little more Pain Location: L side of abdomen  underneath breast Pain Descriptors / Indicators: Sore Pain Intervention(s): Monitored during session (nurse in room & made aware)    Home Living Family/patient expects to be discharged to:: Private residence ([place]) Living Arrangements: Alone Available Help at Discharge: Family;Personal care attendant;Available 24 hours/day Type of Home: House Home Access: Level entry       Home Layout: One level Home Equipment: Grab bars - tub/shower;Rolling Walker (2 wheels);Rollator (4 wheels);Cane - single point;Shower seat Additional Comments: 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 [place], family pays for 24/7 caregivers    Prior Function               Mobility Comments: Pt reports since last admission she has not been walking, has primarily transferred bed<>w/c with assistance. Reports she was receiving HHPT but has only ambulated 1x with them.       Hand Dominance        Extremity/Trunk Assessment   Upper Extremity Assessment Upper Extremity Assessment: Generalized weakness    Lower Extremity Assessment Lower Extremity Assessment: Generalized weakness       Communication   Communication:  (broken speech)  Cognition Arousal/Alertness: Awake/alert Behavior During Therapy: WFL for tasks assessed/performed Overall Cognitive Status:  (Would benefit from further testing to r/o high level cognitive deficits)                                 General Comments: Pt is AxOx4, follows commands throughout session, sweet.        General Comments General comments (skin integrity, edema, etc.): Pt c/o feeling unable to breathe when sitting EOB but SpO2 >/= 90% on room air, HR 94-100 bpm sitting EOB    Exercises     Assessment/Plan    PT Assessment Patient needs continued PT services  PT Problem List Decreased strength;Decreased coordination;Cardiopulmonary status limiting activity;Pain;Impaired sensation;Decreased cognition;Decreased activity tolerance;Decreased  balance;Decreased mobility;Decreased skin integrity;Decreased safety awareness;Decreased knowledge of use of DME       PT Treatment Interventions DME instruction;Therapeutic exercise;Gait training;Balance training;Stair training;Neuromuscular re-education;Functional mobility training;Patient/family education;Therapeutic activities;Manual techniques;Modalities    PT Goals (Current goals can be found in the Care Plan section)  Acute Rehab PT Goals Patient Stated Goal: get better PT Goal Formulation: With patient Time For Goal Achievement: Apr 04, 2022 Potential to Achieve Goals: Fair    Frequency Min 2X/week     Co-evaluation PT/OT/SLP Co-Evaluation/Treatment: Yes Reason for Co-Treatment: Complexity of the patient's impairments (multi-system involvement);Necessary to address cognition/behavior during functional activity;For patient/therapist safety;To address functional/ADL transfers PT goals addressed during session: Mobility/safety with mobility;Balance;Proper use of DME         AM-PAC PT "6 Clicks" Mobility  Outcome Measure Help needed turning from your back to your side while in a flat bed without using bedrails?: A Lot Help needed moving from lying on your back to sitting on the side of a flat bed without using bedrails?: Total Help needed moving to and from a bed to a chair (including a wheelchair)?: Total Help needed standing up from a chair using your arms (e.g., wheelchair or bedside chair)?: Total Help needed to walk in hospital room?: Total Help needed climbing 3-5 steps with a railing? : Total 6 Click Score: 7    End of Session   Activity Tolerance: Patient tolerated treatment well;Patient limited by fatigue Patient left: in chair;with chair alarm set;with  call bell/phone within reach (OT in room) Nurse Communication: Mobility status PT Visit Diagnosis: Muscle weakness (generalized) (M62.81);Unsteadiness on feet (R26.81);Difficulty in walking, not elsewhere classified  (R26.2)    Time: 0601-5615 PT Time Calculation (min) (ACUTE ONLY): 22 min   Charges:   PT Evaluation $PT Eval Moderate Complexity: [place], PT, DPT 03/05/22, 9:39 AM   Waunita Schooner 03/05/2022, 9:36 AM

## 2022-03-05 NOTE — TOC Progression Note (Signed)
Transition of Care [facility]) - Progression Note    Patient Details  Name: April [name] MRN: [id number] Date of Birth: 08/24/1940  Transition of Care [facility]) CM/SW Contact  Izola Price, RN Phone Number: 03/05/2022, 10:27 AM  Clinical Narrative:  11/5: Patient is a current resident of [facility] [place] [place] recommending STR/SNF. Spoke with son after not being able to speak to patient. He had already contacted the [facility] of [facility] [place] [facility] patient came from [facility] she was receiving additional nursing care and it was becoming an issue where they were needing to decide on a higher level of care prior to this admission. RN CM left a VM with Joelene Millin at [facility] to also notify of pending discharge likely on Monday. Son was told they did have a bed available. No insurance authorization required with Medicare A and B. RN CM gave son call back number if other questions arise today. Will submit documents via the electronic HUB. Prior PASRR #[id number] from 03/28/2016.  Simmie Davies RN CM          Expected Discharge Plan and Services                                                 Social Determinants of Health (SDOH) Interventions    Readmission Risk Interventions    02/10/2022   11:11 AM 11/28/2021   11:17 AM 10/19/2021   12:27 PM  Readmission Risk Prevention Plan  Transportation Screening Complete Complete Complete  PCP or Specialist Appt within 3-5 Days  Complete Complete  HRI or [place]  Complete Complete  Social Work Consult for [place] Planning/Counseling  Complete Complete  Palliative Care Screening  Not Applicable Complete  Medication Review Press photographer) Complete Complete Complete  PCP or Specialist appointment within 3-5 days of discharge Complete    HRI or [place] Complete    SW Recovery Care/Counseling Consult Complete    Palliative Care Screening Not [place]  Not Applicable

## 2022-03-05 NOTE — NC FL2 (Signed)
[place] LEVEL OF CARE SCREENING TOOL     IDENTIFICATION  Patient Name: April [name] Birthdate: June 15, 1940 Sex: female Admission Date (Current Location): 03/02/2022  [facility] and [place] Number:  Engineering geologist and Address:  [facility], [address] [place], [place] [address]      Provider Number: [id number]  Attending Physician Name and Address:  Sharen Hones, MD  Relative Name and Phone Number:  Caterra Ostroff [phone]    Current Level of Care: Hospital Recommended Level of Care: [place] Prior Approval Number: [id number]  Date Approved/Denied: 03/28/16 PASRR Number: [id number]  Discharge Plan: SNF    Current Diagnoses: Patient Active Problem List   Diagnosis Date Noted   Subdural hemorrhage ([place]) 03/03/2022   End-stage renal disease on hemodialysis ([place]) 03/03/2022   GERD without esophagitis 03/03/2022   Encephalopathy 03/03/2022   Melena    Pressure injury of skin 02/05/2022   Shock ([place])    Severe sepsis ([place]) 02/01/2022   Anasarca 02/01/2022   Prolonged QT interval 02/01/2022   ESRD on dialysis ([place]) 01/09/2022   HTN (hypertension) 01/09/2022   COPD exacerbation ([place]) 01/09/2022   Sepsis ([place]) 01/09/2022   Anemia in ESRD (end-stage renal disease) ([place]) 01/09/2022   Thrombocytopenia ([place]) 91/69/4503   Acute metabolic encephalopathy 88/82/8003   Acute hepatic encephalopathy ([place]) 12/09/2021   Cirrhosis ([place]) 12/09/2021   Edema and ascites  12/08/2021   Anemia of chronic kidney failure, stage 4 (severe) ([place]) 12/07/2021   Weakness 10/29/2021   Acute respiratory failure with hypoxia ([place]) 10/18/2021   History of recurrent GI bleed on apixaban 10/17/2021   History of adverse reaction to anticoagulant medication 10/17/2021   Electrolyte abnormality 10/17/2021   Chronic diastolic CHF (congestive heart failure) ([place]) 08/08/2021   Chronic obstructive pulmonary disease, unspecified COPD  type ([place]) 08/01/2021   Iron deficiency anemia    Symptomatic anemia    GI bleeding 07/07/2021   Thrombophilia ([place]) 04/10/2021   Aortic atherosclerosis ([place]) 01/03/2021   Cellulitis of leg, left 10/26/2020   Breast tenderness 07/14/2020   Abnormal liver function tests 03/11/2020   Lymphedema 07/09/2018   Acute renal failure superimposed on stage 4 chronic kidney disease ([place]) 06/03/2018   Lower extremity edema 01/19/2018   Arthropathy of lumbar facet joint 08/08/2017   Degeneration of lumbar intervertebral disc 08/08/2017   Osteoarthritis of knee 08/08/2017   Paroxysmal atrial fibrillation ([place]) 03/27/2016   CAD S/P PCI pRCA Promus DES 3.5 x 16 (4.1 mm), ostRPDA Promus DES 2.5 x 12 (2.7 mm) 03/25/2016   Coronary artery disease involving native coronary artery of native heart with unstable angina pectoris ([place]) 06/01/2015   Diabetes mellitus ([place]) 05/16/2015   Gastritis 04/01/2015   Chest pain 04/01/2015   Hiatal hernia 08/25/2014   DOE (dyspnea on exertion) 07/15/2014   Congestion of throat 07/06/2014   Health care maintenance 07/06/2014   Fibrocystic breast disease 07/06/2014   Morbid obesity ([place]) 04/19/2014   Stress 02/16/2013   Rib pain on right side 02/16/2013   Acute on Chronic dyspnea 10/13/2012   GERD (gastroesophageal reflux disease) 10/13/2012   Essential hypertension, benign 08/18/2012   Hyperlipidemia 08/18/2012   History of colonic polyps 08/18/2012   OSA on CPAP 08/18/2012   Osteoporosis 08/18/2012    Orientation RESPIRATION BLADDER Height & Weight     Self, Time, Situation, Place  Normal Continent Weight:   Height:     BEHAVIORAL SYMPTOMS/MOOD NEUROLOGICAL BOWEL NUTRITION STATUS      Incontinent  Diet  AMBULATORY STATUS COMMUNICATION OF NEEDS Skin   Extensive Assist Verbally Skin abrasions, Other (Comment) (bilateral LE)                       Personal Care Assistance Level of Assistance  Bathing, Feeding, Dressing Bathing Assistance: Maximum  assistance Feeding assistance: Limited assistance Dressing Assistance: Maximum assistance     Functional Limitations Info             SPECIAL CARE FACTORS FREQUENCY  PT (By licensed PT), OT (By licensed OT)     PT Frequency: 5x/week OT Frequency: 5x/week            Contractures Contractures Info: Not present    Additional Factors Info  Code Status, Allergies Code Status Info: DNR Allergies Info: Penicillins, Sulfa Antibiotics           Current Medications (03/05/2022):  This is the current hospital active medication list Current Facility-Administered Medications  Medication Dose Route Frequency Provider Last Rate Last Admin   acetaminophen (TYLENOL) tablet 650 mg  650 mg Oral Q4H PRN Mansy, Jan A, MD       Or   acetaminophen (TYLENOL) 160 MG/5ML solution 650 mg  650 mg Per Tube Q4H PRN Mansy, Jan A, MD       Or   acetaminophen (TYLENOL) suppository 650 mg  650 mg Rectal Q4H PRN Mansy, Jan A, MD       amiodarone (PACERONE) tablet 200 mg  200 mg Oral Daily Mansy, Jan A, MD   200 mg at 03/05/22 0909   Chlorhexidine Gluconate Cloth 2 % PADS 6 each  6 each Topical Q0600 Colon Flattery, NP   6 each at 03/05/22 0620   labetalol (NORMODYNE) injection 20 mg  20 mg Intravenous Q3H PRN Mansy, Jan A, MD       lactulose (CHRONULAC) 10 GM/15ML solution 30 g  30 g Oral TID Mansy, Jan A, MD   30 g at 03/05/22 0908   magnesium hydroxide (MILK OF MAGNESIA) suspension 30 mL  30 mL Oral Daily PRN Mansy, Jan A, MD       metoCLOPramide (REGLAN) injection 5 mg  5 mg Intravenous Q6H PRN Mansy, Jan A, MD       midodrine (PROAMATINE) tablet 10 mg  10 mg Oral TID WC Duffy Bruce, MD   10 mg at 03/05/22 1135   multivitamin with minerals tablet 1 tablet  1 tablet Oral Daily Mansy, Jan A, MD   1 tablet at 03/05/22 0909   nitroGLYCERIN (NITROSTAT) SL tablet 0.4 mg  0.4 mg Sublingual Q5 min PRN Mansy, Jan A, MD       pantoprazole (PROTONIX) EC tablet 40 mg  40 mg Oral QHS Dallie Piles, RPH        rifaximin Doreene Nest) tablet 550 mg  550 mg Oral BID Duffy Bruce, MD   550 mg at 03/05/22 0908   senna-docusate (Senokot-S) tablet 1 tablet  1 tablet Oral BID Mansy, Jan A, MD   1 tablet at 03/05/22 0909   simethicone (MYLICON) chewable tablet 80 mg  80 mg Oral QID PRN Sharen Hones, MD       torsemide [employer]) tablet 80 mg  80 mg Oral Daily Mansy, Jan A, MD   80 mg at 03/05/22 0909     Discharge Medications: Please see discharge summary for a list of discharge medications.  Relevant Imaging Results:  Relevant Lab Results:   Additional Information SS#: [id number]  Pamala Hurry  Olen Cordial, RN

## 2022-03-05 NOTE — Evaluation (Signed)
Speech Language Pathology Evaluation Patient Details Name: April [name] MRN: [id number] DOB: [birth year] Today's Date: 03/05/2022 Time: 9937-1696 SLP Time Calculation (min) (ACUTE ONLY): 25 min  Problem List:  Patient Active Problem List   Diagnosis Date Noted   Subdural hemorrhage ([place]) 03/03/2022   End-stage renal disease on hemodialysis ([place]) 03/03/2022   GERD without esophagitis 03/03/2022   Encephalopathy 03/03/2022   Melena    Pressure injury of skin 02/05/2022   Shock ([place])    Severe sepsis ([place]) 02/01/2022   Anasarca 02/01/2022   Prolonged QT interval 02/01/2022   ESRD on dialysis ([place]) 01/09/2022   HTN (hypertension) 01/09/2022   COPD exacerbation ([place]) 01/09/2022   Sepsis ([place]) 01/09/2022   Anemia in ESRD (end-stage renal disease) ([place]) 01/09/2022   Thrombocytopenia ([place]) 78/93/8101   Acute metabolic encephalopathy 75/01/2584   Acute hepatic encephalopathy ([place]) 12/09/2021   Cirrhosis ([place]) 12/09/2021   Edema and ascites  12/08/2021   Anemia of chronic kidney failure, stage 4 (severe) ([place]) 12/07/2021   Weakness 10/29/2021   Acute respiratory failure with hypoxia ([place]) 10/18/2021   History of recurrent GI bleed on apixaban 10/17/2021   History of adverse reaction to anticoagulant medication 10/17/2021   Electrolyte abnormality 10/17/2021   Chronic diastolic CHF (congestive heart failure) ([place]) 08/08/2021   Chronic obstructive pulmonary disease, unspecified COPD type ([place]) 08/01/2021   Iron deficiency anemia    Symptomatic anemia    GI bleeding 07/07/2021   Thrombophilia ([place]) 04/10/2021   Aortic atherosclerosis ([place]) 01/03/2021   Cellulitis of leg, left 10/26/2020   Breast tenderness 07/14/2020   Abnormal liver function tests 03/11/2020   Lymphedema 07/09/2018   Acute renal failure superimposed on stage 4 chronic kidney disease ([place]) 06/03/2018   Lower extremity edema 01/19/2018   Arthropathy of lumbar facet joint 08/08/2017   Degeneration of lumbar  intervertebral disc 08/08/2017   Osteoarthritis of knee 08/08/2017   Paroxysmal atrial fibrillation ([place]) 03/27/2016   CAD S/P PCI pRCA Promus DES 3.5 x 16 (4.1 mm), ostRPDA Promus DES 2.5 x 12 (2.7 mm) 03/25/2016   Coronary artery disease involving native coronary artery of native heart with unstable angina pectoris ([place]) 06/01/2015   Diabetes mellitus ([place]) 05/16/2015   Gastritis 04/01/2015   Chest pain 04/01/2015   Hiatal hernia 08/25/2014   DOE (dyspnea on exertion) 07/15/2014   Congestion of throat 07/06/2014   Health care maintenance 07/06/2014   Fibrocystic breast disease 07/06/2014   Morbid obesity ([place]) 04/19/2014   Stress 02/16/2013   Rib pain on right side 02/16/2013   Acute on Chronic dyspnea 10/13/2012   GERD (gastroesophageal reflux disease) 10/13/2012   Essential hypertension, benign 08/18/2012   Hyperlipidemia 08/18/2012   History of colonic polyps 08/18/2012   OSA on CPAP 08/18/2012   Osteoporosis 08/18/2012   Past Medical History:  Past Medical History:  Diagnosis Date   Anemia    CAD S/P PCI pRCA Promus DES 3.5 x 16 (4.1 mm), ostRPDA Promus DES 2.5 x 12 (2.7 mm) 03/25/2016   CAD S/P percutaneous coronary angioplasty    a. 07/2015 MV: No ischemia, EF 66%;  b. 03/2016 Inflat STEMI/PCI: LM nl, LAD 25d, RI nl, LCX nl, OM1/2 nl, RCA 80p (3.5x16 Promus Premier DES), 50p/m, 30d, RPDA 90 (2.5x12 Promu Premier DES); c. 01/2018 Cath ([place], [place]): Patent RCA stents->Med Rx.   CHF (congestive heart failure) (HCC)    CKD (chronic kidney disease), stage III ([place])    Diabetes mellitus without complication ([place])    Diastolic  dysfunction    a. 10/2013 Echo: EF 55-65%, Gr1 DD; b. 01/2018 Echo ([place], [place]): EF 55-60%, Gr2 DD, RVSP 61mHg.   Diverticulitis    Dyspnea    Endometriosis    Family history of adverse reaction to anesthesia    Mother - had to stay in ICU because of breathing difficulties every time she had anesthesia   GERD  (gastroesophageal reflux disease)    GI bleed    a. 06/2021 - melena-->2 u PRBCs.   Hiatal hernia    History of colon polyps 08/1994   History of Migraines    Hypercholesterolemia    Hypertension    LBBB (left bundle branch block)    Osteoporosis    PAF (paroxysmal atrial fibrillation) ([place]    a. 03/2016 Dx @ time of MI; b. CHA2DS2VASc = 5-->Eliquis.   Rheumatic fever    Sleep apnea    CPAP   Spasmodic dysphonia    ST elevation myocardial infarction (STEMI) of inferolateral wall, initial episode of care ([place] 03/25/2016   ST elevation myocardial infarction (STEMI) of inferolateral wall, initial episode of care ([facility] 03/25/2016   Urine incontinence    Past Surgical History:  Past Surgical History:  Procedure Laterality Date   ABDOMINAL HYSTERECTOMY     ovaries not removed   APPENDECTOMY     was removed during hysterectomy   Breast biopsies     x2   BREAST EXCISIONAL BIOPSY Bilateral "years ago"   neg   BREAST SURGERY Bilateral    BROW LIFT Bilateral 08/15/2019   Procedure: BLEPHAROPLASTY UPPER EYELID; W/EXCESS SKIN BLEPHAROPTOSIS REPAIR; RESECT EX;  Surgeon: FKarle Starch MD;  [facility]  Service: Ophthalmology;  Laterality: Bilateral;  sleep apnea   CARDIAC CATHETERIZATION  2011   moderate 40% RCA disease   CARDIAC CATHETERIZATION  01/2010   Dr. Gollan'@[facility]'$ : Only noted 40% RCA   CARDIAC CATHETERIZATION N/A 03/25/2016   Procedure: Left Heart Cath and Coronary Angiography;  Surgeon: D12/11/2015 MD;  [facility];  Service: Cardiovascular;  Laterality: N/A;   CARDIAC CATHETERIZATION N/A 03/25/2016   Procedure: Coronary Stent Intervention;  Surgeon: D12/11/2015 MD;  [facility];  Service: Cardiovascular;  Laterality: N/A;   COLONOSCOPY  2013   COLONOSCOPY WITH PROPOFOL N/A 07/11/2021   Procedure: COLONOSCOPY WITH PROPOFOL;  Surgeon: W3/26/2023 MD;  [facility];  Service: Endoscopy;  Laterality: N/A;    DIALYSIS/PERMA CATHETER INSERTION N/A 12/15/2021   Procedure: DIALYSIS/PERMA CATHETER INSERTION;  Surgeon: D8/30/2023 MD;  [facility];  Service: Cardiovascular;  Laterality: N/A;   ENTEROSCOPY N/A 02/11/2022   Procedure: ENTEROSCOPY;  Surgeon: [place], T10/27/2023 MD;  Location: [facility]COPY;  Service: Gastroenterology;  Laterality: N/A;   ESOPHAGOGASTRODUODENOSCOPY (EGD) WITH PROPOFOL N/A 03/17/2015   Procedure: ESOPHAGOGASTRODUODENOSCOPY (EGD) WITH PROPOFOL;  Surgeon: S11/29/2016 MD;  Location: [facility] END[facility]COPY;  Service: Gastroenterology;  Laterality: N/A;   ESOPHAGOGASTRODUODENOSCOPY (EGD) WITH PROPOFOL N/A 07/10/2021   Procedure: ESOPHAGOGASTRODUODENOSCOPY (EGD) WITH PROPOFOL;  Surgeon: V16/03/2022 MD;  [facility];  Service: Gastroenterology;  Laterality: N/A;   GIVENS CAPSULE STUDY N/A 07/11/2021   Procedure: GIVENS CAPSULE STUDY;  Surgeon: W3/26/2023 MD;  [facility];  Service: Endoscopy;  Laterality: N/A;   NM MYOVIEW ([facility])  07/2015   No evidence ischemia or infarction. EF 66%. Low risk   TEMPORARY DIALYSIS CATHETER N/A 12/08/2021   Procedure: TEMPORARY DIALYSIS CATHETER;  Surgeon: Algernon Huxley, MD;  Location: [place] [facility];  Service: Cardiovascular;  Laterality: N/A;   TONSILLECTOMY     TRANSTHORACIC ECHOCARDIOGRAM  10/2013   Normal LV size and function. EF 55-65%. GR 1 DD. Otherwise normal.   HPI:  Pt is a 81 y.o. Caucasian female with medical history of Multiple medical problems including CKD, DM, CHF, Migraines, Large Hiatal Hernia, HTN, CAD, Spasmodic Dysphonia and Liver Cirrhosis, on HD who presented to the emergency room with acute onset of confusion and hypotension while having her hemodialysis session on Thursday.  Per her son, she has been fairly confused since Monday of this week but was able to finish hemodialysis on Tuesday.  Her confusion worsened since the night before dialysis.  She did not  have any reported chest pain or dyspnea or cough or wheezing.  No fever or chills.  No nausea or vomiting or abdominal pain.  She has not been taking her lactulose regularly due to frequent bowel movements with it.  No headache or dizziness or blurred vision.  No paresthesias or focal muscle weakness.  IMAGING: CT A/P 01/2022--  1. Cirrhotic liver with moderate volume ascites.  2. Small left and trace right pleural effusions with associated  compressive atelectasis.  3. Moderate-large hiatal hernia containing the majority of the  stomach within the chest.  4. Left-sided colonic diverticulosis without evidence for acute  diverticulitis.  5. Anasarca.  Head CT: Head ct: Stable 7 mm in thickness left frontotemporal convexity subdural  hematoma and 3 mm in thickness left tentorial subdural bleed. No new  or worsening hemorrhage.  2. 2 mm left-to-right midline shift of the septum pellucidum, no  downward mass effect.  3. Atrophy and small-vessel disease.  MD admitting dx included: Acute hepatic encephalopathy ([place])  - This is likely the culprit for her altered mental status and confusion rather than her subdural hematoma.  - It is likely due to noncompliance with lactulose.  - She will be placed on lactulose and rifaximin.   Assessment / Plan / Recommendation Clinical Impression  Pt presents with questionable mild cognitive communication deficit in the setting of subdurral hemorrhage based on dynamic assessment and portions of the Mini Mental State examination. Mild deficits suspected regarding problem solving and executive function based on challenge with sequencing when repositioning in recliner and higher level medical/financial management prompts. Recommend provision of clear instructions, repetition, and extended time for processing. Receptive/expressive language, orientation, safety reasoning, attention, and short term recall appear relatively intact. Pt endorsed feeling close to cognitive baseline; however,  family not present for verification. Plan to follow up for further assessment for variation in baseline and identification of useful compensatory strategies while in house.    SLP Assessment  SLP Recommendation/Assessment: Patient needs continued Speech Lanaguage Pathology Services SLP Visit Diagnosis: Dysphagia, unspecified (R13.10);Cognitive communication deficit (R41.841)    Recommendations for follow up therapy are one component of a multi-disciplinary discharge planning process, led by the attending physician.  Recommendations may be updated based on patient status, additional functional criteria and insurance authorization.    Follow Up Recommendations  Skilled nursing-short term rehab (<3 hours/day)    Assistance Recommended at Discharge  Frequent or constant Supervision/Assistance  Functional Status Assessment Patient has had a recent decline in their functional status and demonstrates the ability to make significant improvements in function in a reasonable and predictable amount of time.  Frequency and Duration min 2x/week  2 weeks      SLP Evaluation Cognition  Overall  Cognitive Status: Impaired/Different from baseline Arousal/Alertness: Awake/alert Orientation Level: Oriented X4 Attention: Sustained Sustained Attention: Appears intact Memory: Appears intact Memory Impairment:  (Pt recalling name of therapist from ear[place] this date and details of hospital course) Awareness: Impaired Awareness Impairment: Intellectual impairment Problem Solving: Impaired Problem Solving Impairment: Verbal basic Executive Function: Sequencing Sequencing: Impaired Sequencing Impairment: Verbal basic (Imparied sequencing for attempting to sit up right in recliner) Safety/Judgment: Appears intact Comments: 8/8 for safety reasoning questions       Comprehension  Auditory Comprehension Overall Auditory Comprehension: Appears within functional limits for tasks assessed Visual  Recognition/Discrimination Discrimination: Not tested Reading Comprehension Reading Status: Not tested    Expression Expression Primary Mode of Expression: Verbal Verbal Expression Overall Verbal Expression: Appears within functional limits for tasks assessed Initiation: No impairment Written Expression Dominant Hand: Right Written Expression: Not tested   Oral / Motor  Oral Motor/Sensory Function Overall Oral Motor/Sensory Function: Within functional limits Motor Speech Overall Motor Speech: Appears within functional limits for tasks assessed (baseline spasmodic dysphonia)           [place] Mikolaj Woolstenhulme Clapp  MS CCC SLP   [place] J Clapp 03/05/2022, 5:57 PM

## 2022-03-06 ENCOUNTER — Inpatient Hospital Stay: Payer: Medicare Other

## 2022-03-06 DIAGNOSIS — I6201 Nontraumatic acute subdural hemorrhage: Secondary | ICD-10-CM

## 2022-03-06 DIAGNOSIS — D649 Anemia, unspecified: Secondary | ICD-10-CM

## 2022-03-06 DIAGNOSIS — G9340 Encephalopathy, unspecified: Secondary | ICD-10-CM

## 2022-03-06 DIAGNOSIS — E7220 Disorder of urea cycle metabolism, unspecified: Secondary | ICD-10-CM

## 2022-03-06 LAB — BASIC METABOLIC PANEL
Anion gap: 10 (ref 5–15)
Anion gap: 8 (ref 5–15)
BUN: 17 mg/dL (ref 8–23)
BUN: 19 mg/dL (ref 8–23)
CO2: 26 mmol/L (ref 22–32)
CO2: 27 mmol/L (ref 22–32)
Calcium: 8.5 mg/dL — ABNORMAL LOW (ref 8.9–10.3)
Calcium: 8.4 mg/dL — ABNORMAL LOW (ref 8.9–10.3)
Chloride: 96 mmol/L — ABNORMAL LOW (ref 98–111)
Chloride: 97 mmol/L — ABNORMAL LOW (ref 98–111)
Creatinine, Ser: 2.9 mg/dL — ABNORMAL HIGH (ref 0.44–1.00)
Creatinine, Ser: 3.13 mg/dL — ABNORMAL HIGH (ref 0.44–1.00)
GFR, Estimated: 16 mL/min — ABNORMAL LOW (ref >60)
GFR, Estimated: 14 mL/min — ABNORMAL LOW (ref >60)
Glucose, Bld: 134 mg/dL — ABNORMAL HIGH (ref 70–99)
Glucose, Bld: 109 mg/dL — ABNORMAL HIGH (ref 70–99)
Potassium: 3.2 mmol/L — ABNORMAL LOW (ref 3.5–5.1)
Potassium: 3.3 mmol/L — ABNORMAL LOW (ref 3.5–5.1)
Sodium: 132 mmol/L — ABNORMAL LOW (ref 135–145)
Sodium: 132 mmol/L — ABNORMAL LOW (ref 135–145)

## 2022-03-06 LAB — HEPATITIS B E ANTIBODY: Hep B E Ab: NEGATIVE

## 2022-03-06 LAB — CBC
HCT: 22.9 % — ABNORMAL LOW (ref 36.0–46.0)
HCT: 22.6 % — ABNORMAL LOW (ref 36.0–46.0)
Hemoglobin: 7.5 g/dL — ABNORMAL LOW (ref 12.0–15.0)
Hemoglobin: 7.3 g/dL — ABNORMAL LOW (ref 12.0–15.0)
MCH: 32.8 pg (ref 26.0–34.0)
MCH: 32.6 pg (ref 26.0–34.0)
MCHC: 32.8 g/dL (ref 30.0–36.0)
MCHC: 32.3 g/dL (ref 30.0–36.0)
MCV: 100 fL (ref 80.0–100.0)
MCV: 100.9 fL — ABNORMAL HIGH (ref 80.0–100.0)
Platelets: 84 10*3/uL — ABNORMAL LOW (ref 150–400)
Platelets: 83 10*3/uL — ABNORMAL LOW (ref 150–400)
RBC: 2.29 MIL/uL — ABNORMAL LOW (ref 3.87–5.11)
RBC: 2.24 MIL/uL — ABNORMAL LOW (ref 3.87–5.11)
RDW: 22.5 % — ABNORMAL HIGH (ref 11.5–15.5)
RDW: 22.5 % — ABNORMAL HIGH (ref 11.5–15.5)
WBC: 14.3 10*3/uL — ABNORMAL HIGH (ref 4.0–10.5)
WBC: 15.2 10*3/uL — ABNORMAL HIGH (ref 4.0–10.5)
nRBC: 1.1 % — ABNORMAL HIGH (ref 0.0–0.2)
nRBC: 0.9 % — ABNORMAL HIGH (ref 0.0–0.2)

## 2022-03-06 LAB — PREPARE RBC (CROSSMATCH)

## 2022-03-06 LAB — HEMOGLOBIN: Hemoglobin: 9.3 g/dL — ABNORMAL LOW (ref 12.0–15.0)

## 2022-03-06 LAB — BODY FLUID CELL COUNT WITH DIFFERENTIAL
Eos, Fluid: 0 %
Lymphs, Fluid: 33 %
Monocyte-Macrophage-Serous Fluid: 47 %
Neutrophil Count, Fluid: 20 %
Total Nucleated Cell Count, Fluid: 34 cu mm

## 2022-03-06 LAB — ALBUMIN, PLEURAL OR PERITONEAL FLUID: Albumin, Fluid: <1.5 g/dL

## 2022-03-06 MED ORDER — LIDOCAINE HCL (PF) 1 % IJ SOLN
10.0000 mL | Freq: Once | INTRAMUSCULAR | Status: AC
  Administered 2022-03-06: 10 mL via INTRADERMAL

## 2022-03-06 MED ORDER — PANTOPRAZOLE SODIUM 40 MG IV SOLR
40.0000 mg | Freq: Two times a day (BID) | INTRAVENOUS | Status: DC
  Administered 2022-03-06 – 2022-03-08 (×5): 40 mg via INTRAVENOUS
  Filled 2022-03-06 (×5): qty 10

## 2022-03-06 MED ORDER — EPOETIN ALFA 10000 UNIT/ML IJ SOLN: 10000.0000 [IU] | INTRAMUSCULAR | Status: DC

## 2022-03-06 MED ORDER — POTASSIUM CHLORIDE CRYS ER 20 MEQ PO TBCR
40.0000 meq | EXTENDED_RELEASE_TABLET | Freq: Once | ORAL | Status: AC
  Administered 2022-03-06: 40 meq via ORAL
  Filled 2022-03-06: qty 2

## 2022-03-06 MED ORDER — SODIUM CHLORIDE 0.9% IV SOLUTION: Freq: Once | INTRAVENOUS | Status: AC

## 2022-03-06 MED ORDER — ALBUMIN HUMAN 25 % IV SOLN
12.5000 g | Freq: Once | INTRAVENOUS | Status: AC
  Administered 2022-03-06: 12.5 g via INTRAVENOUS
  Filled 2022-03-06: qty 50

## 2022-03-06 NOTE — Progress Notes (Signed)
[facility]  ROUNDING NOTE   Subjective:   April [name] is a 81 year old female with past medical history including liver cirrhosis, anemia, diverticulitis, hypertension, CHF, CAD, and end stage renal disease on hemodialysis. She presents to the emergency department with confusion. She has been admitted for Subdural hemorrhage (HCC) [I62.00] Hyperammonemia ([place]) [E72.20] Encephalopathy [G93.40] Acute on chronic intracranial subdural hematoma (HCC) [I62.01, I62.03]  This patient is known to our clinic and received outpatient dialysis treatments at [facility] on a TTS schedule, supervised by Dr. Candiss Norse.    Patient seen resting quietly in bed, currently on room air No family at bedside Appetite appropriate, denies nausea vomiting Minimal lower extremity edema  Objective:  Vital signs in last 24 hours:  Temp:  [97.7 F (36.5 C)-98.9 F (37.2 C)] 97.9 F (36.6 C) (11/06 1203) Pulse Rate:  [77-88] 82 (11/06 1203) Resp:  [12-18] 12 (11/06 1203) BP: (91-121)/(46-77) 117/47 (11/06 1203) SpO2:  [93 %-97 %] 94 % (11/06 1203)  Weight change:  There were no vitals filed for this visit.   Intake/Output: I/O last 3 completed shifts: In: 720 [P.O.:720] Out: -    Intake/Output this shift:  No intake/output data recorded.  Physical Exam: General: NAD, resting comfortably  Head: Normocephalic, atraumatic. Moist oral mucosal membranes  Eyes: Anicteric  Lungs:  Clear to auscultation, normal effort, room air  Heart: Irregular rate and rhythm, no murmurs  Abdomen:  Soft, nontender  Extremities:  1+  peripheral edema  Neurologic: Alert, oriented to self  Skin: No lesions  Access: Rt chest permcath    Basic Metabolic Panel: Recent Labs  Lab 03/02/22 1426 03/06/22 0350 03/06/22 0808  NA 138 132* 132*  K 4.6 3.2* 3.3*  CL 103 96* 97*  CO2 '25 26 27  '$ GLUCOSE 186* 134* 109*  BUN '14 17 19  '$ CREATININE 2.65* 2.90* 3.13*  CALCIUM 8.2* 8.5* 8.4*     Liver Function  Tests: Recent Labs  Lab 03/02/22 1426  AST 93*  ALT 40  ALKPHOS 120  BILITOT 2.0*  PROT 5.6*  ALBUMIN 2.2*    No results for input(s): "LIPASE", "AMYLASE" in the last 168 hours. Recent Labs  Lab 03/03/22 0543 03/04/22 0642 03/05/22 0552  AMMONIA 63* 44* 37*      CBC: Recent Labs  Lab 03/02/22 1426 03/06/22 0350 03/06/22 0808  WBC 21.2* 14.3* 15.2*  HGB 8.3* 7.5* 7.3*  HCT 25.2* 22.9* 22.6*  MCV 100.8* 100.0 100.9*  PLT 124* 84* 83*     Cardiac Enzymes: No results for input(s): "CKTOTAL", "CKMB", "CKMBINDEX", "TROPONINI" in the last 168 hours.  BNP: Invalid input(s): "POCBNP"  CBG: No results for input(s): "GLUCAP" in the last 168 hours.  Microbiology: Results for orders placed or performed during the hospital encounter of 02/01/22  Culture, blood (Routine x 2)     Status: None   Collection Time: 02/01/22 11:53 AM   Specimen: BLOOD  Result Value Ref Range Status   Specimen Description BLOOD LEFT ANTECUBITAL  Final   Special Requests   Final    BOTTLES DRAWN AEROBIC AND ANAEROBIC Blood Culture results may not be optimal due to an excessive volume of blood received in culture bottles   Culture   Final    NO GROWTH 5 DAYS Performed at [facility], [address], [place], [place] [address]    Report Status 02/06/2022 FINAL  Final  Culture, blood (Routine x 2)     Status: None   Collection Time:  02/01/22  1:16 PM   Specimen: BLOOD  Result Value Ref Range Status   Specimen Description BLOOD BLOOD LEFT FOREARM  Final   Special Requests BLOOD LEFT ARM Blood Culture adequate volume  Final   Culture   Final    NO GROWTH 5 DAYS Performed at [facility], [address] [place], [place] [address]    Report Status 02/06/2022 FINAL  Final  Resp Panel by RT-PCR (Flu A&B, Covid) Anterior Nasal Swab     Status: None   Collection Time: 02/01/22  1:17 PM   Specimen: Anterior Nasal Swab  Result Value Ref Range Status   SARS Coronavirus 2 by  RT PCR NEGATIVE NEGATIVE Final    Comment: (NOTE) SARS-CoV-2 target nucleic acids are NOT DETECTED.  The SARS-CoV-2 RNA is generally detectable in upper respiratory specimens during the acute phase of infection. The lowest concentration of SARS-CoV-2 viral copies this assay can detect is 138 copies/mL. A negative result does not preclude SARS-Cov-2 infection and should not be used as the sole basis for treatment or other patient management decisions. A negative result may occur with  improper specimen collection/handling, submission of specimen other than nasopharyngeal swab, presence of viral mutation(s) within the areas targeted by this assay, and inadequate number of viral copies(<138 copies/mL). A negative result must be combined with clinical observations, patient history, and epidemiological information. The expected result is Negative.  Fact Sheet for Patients:  EntrepreneurPulse.com.au  Fact Sheet for Healthcare Providers:  IncredibleEmployment.be  This test is no t yet approved or cleared by the [place] FDA and  has been authorized for detection and/or diagnosis of SARS-CoV-2 by FDA under an Emergency Use Authorization (EUA). This EUA will remain  in effect (meaning this test can be used) for the duration of the COVID-19 declaration under Section 564(b)(1) of the Act, 21 U.S.C.section 360bbb-3(b)(1), unless the authorization is terminated  or revoked sooner.       Influenza A by PCR NEGATIVE NEGATIVE Final   Influenza B by PCR NEGATIVE NEGATIVE Final    Comment: (NOTE) The Xpert Xpress SARS-CoV-2/FLU/RSV plus assay is intended as an aid in the diagnosis of influenza from Nasopharyngeal swab specimens and should not be used as a sole basis for treatment. Nasal washings and aspirates are unacceptable for Xpert Xpress SARS-CoV-2/FLU/RSV testing.  Fact Sheet for Patients: EntrepreneurPulse.com.au  Fact Sheet  for Healthcare Providers: IncredibleEmployment.be  This test is not yet approved or cleared by the [place] FDA and has been authorized for detection and/or diagnosis of SARS-CoV-2 by FDA under an Emergency Use Authorization (EUA). This EUA will remain in effect (meaning this test can be used) for the duration of the COVID-19 declaration under Section 564(b)(1) of the Act, 21 U.S.C. section 360bbb-3(b)(1), unless the authorization is terminated or revoked.  Performed at [facility], [address], [place], [place] [address]   Peritoneal fluid culture w Gram Stain     Status: None   Collection Time: 02/01/22  4:59 PM   Specimen: Peritoneal Washings; Peritoneal Fluid  Result Value Ref Range Status   Specimen Description   Final    PERITONEAL Performed at [facility], [address] [place], [place] [address]    Special Requests   Final    NONE Performed at [facility], [place], [place] [address]    Gram Stain NO ORGANISMS SEEN NO WBC SEEN   Final   Culture   Final    NO GROWTH 3  DAYS Performed at [facility], [place] [address] [place], [place] [address]    Report Status 02/05/2022 FINAL  Final  MRSA Next Gen by PCR, Nasal     Status: None   Collection Time: 02/01/22 10:53 PM   Specimen: Nasal Mucosa; Nasal Swab  Result Value Ref Range Status   MRSA by PCR Next Gen NOT DETECTED NOT DETECTED Final    Comment: (NOTE) The GeneXpert MRSA Assay (FDA approved for NASAL specimens only), is one component of a comprehensive MRSA colonization surveillance program. It is not intended to diagnose MRSA infection nor to guide or monitor treatment for MRSA infections. Test performance is not FDA approved in patients less than 29 years old. Performed at [facility], [address] [place], [place] [address]   C Difficile Quick Screen w PCR reflex     Status: None   Collection Time: 02/02/22  5:58  PM   Specimen: STOOL  Result Value Ref Range Status   C Diff antigen NEGATIVE NEGATIVE Final   C Diff toxin NEGATIVE NEGATIVE Final   C Diff interpretation No C. difficile detected.  Final    Comment: Performed at [facility], [address] [place], [place] [address]  Gastrointestinal Panel by PCR , Stool     Status: None   Collection Time: 02/02/22  5:58 PM   Specimen: Stool  Result Value Ref Range Status   Campylobacter species NOT DETECTED NOT DETECTED Final   Plesimonas shigelloides NOT DETECTED NOT DETECTED Final   Salmonella species NOT DETECTED NOT DETECTED Final   Yersinia enterocolitica NOT DETECTED NOT DETECTED Final   Vibrio species NOT DETECTED NOT DETECTED Final   Vibrio cholerae NOT DETECTED NOT DETECTED Final   Enteroaggregative E coli (EAEC) NOT DETECTED NOT DETECTED Final   Enteropathogenic E coli (EPEC) NOT DETECTED NOT DETECTED Final   Enterotoxigenic E coli (ETEC) NOT DETECTED NOT DETECTED Final   Shiga like toxin producing E coli (STEC) NOT DETECTED NOT DETECTED Final   Shigella/Enteroinvasive E coli (EIEC) NOT DETECTED NOT DETECTED Final   Cryptosporidium NOT DETECTED NOT DETECTED Final   Cyclospora cayetanensis NOT DETECTED NOT DETECTED Final   Entamoeba histolytica NOT DETECTED NOT DETECTED Final   Giardia lamblia NOT DETECTED NOT DETECTED Final   Adenovirus F40/41 NOT DETECTED NOT DETECTED Final   Astrovirus NOT DETECTED NOT DETECTED Final   Norovirus GI/GII NOT DETECTED NOT DETECTED Final   Rotavirus A NOT DETECTED NOT DETECTED Final   Sapovirus (I, II, IV, and V) NOT DETECTED NOT DETECTED Final    Comment: Performed at [facility], [address] [place], [place] [address]  Peritoneal fluid culture w Gram Stain     Status: None   Collection Time: 02/03/22  2:42 PM   Specimen: Peritoneal Washings; Peritoneal Fluid  Result Value Ref Range Status   Specimen Description   Final    PERITONEAL Performed at [facility], [address], [place], [place] [address]    Special Requests   Final    NONE Performed at [facility], [place], [place] [address]    Gram Stain NO WBC SEEN NO ORGANISMS SEEN   Final   Culture   Final    NO GROWTH 3 DAYS Performed at [facility], [place] [address] [place],  [address]    Report Status 02/07/2022 FINAL  Final    Coagulation Studies: No results for input(s): "LABPROT", "INR" in the last 72  hours.    Urinalysis: No results for input(s): "COLORURINE", "LABSPEC", "PHURINE", "GLUCOSEU", "HGBUR", "BILIRUBINUR", "KETONESUR", "PROTEINUR", "UROBILINOGEN", "NITRITE", "LEUKOCYTESUR" in the last 72 hours.  Invalid input(s): "APPERANCEUR"     Imaging: CT HEAD WO CONTRAST (5MM)  Result Date: 03/06/2022 CLINICAL DATA:  Subdural hematoma EXAM: CT HEAD WITHOUT CONTRAST TECHNIQUE: Contiguous axial images were obtained from the base of the skull through the vertex without intravenous contrast. RADIATION DOSE REDUCTION: This exam was performed according to the departmental dose-optimization program which includes automated exposure control, adjustment of the mA and/or kV according to patient size and/or use of iterative reconstruction technique. COMPARISON:  CT head 03/03/2022 FINDINGS: Brain: The left cerebral convexity subdural hematoma measuring up to proximally 8 mm in maximal thickness in the coronal plane is not significantly changed in size when remeasured using similar technique and is overall decreased in density, consistent with evolving blood products. The component of blood along the left tentorial leaflet is decreased in conspicuity. There is no new hyperdense blood within the collection to suggest acute/interval bleed. There is minimal mass effect on the underlying brain parenchyma with no midline shift. There is no new acute intracranial hemorrhage or extra-axial fluid collection. There is no acute territorial infarct. The ventricles are  stable in size.  There is no solid mass lesion. Vascular: There is calcification of the bilateral carotid siphons and vertebral arteries. Skull: Normal. Negative for fracture or focal lesion. Sinuses/Orbits: The imaged paranasal sinuses are clear. Bilateral lens implants are in place. The globes and orbits are otherwise unremarkable. Other: The calcified left parieto-occipital scalp lesion is unchanged. IMPRESSION: Expected evolution of 8 mm maximally thick left cerebral convexity subdural hematoma with no midline shift. No new acute intracranial pathology. Electronically Signed   By: Valetta Mole M.D.   On: 03/06/2022 09:44     Medications:    albumin human      sodium chloride   Intravenous Once   amiodarone  200 mg Oral Daily   Chlorhexidine Gluconate Cloth  6 each Topical Q0600   lactulose  30 g Oral TID   midodrine  10 mg Oral TID WC   multivitamin with minerals  1 tablet Oral Daily   pantoprazole (PROTONIX) IV  40 mg Intravenous Q12H   rifaximin  550 mg Oral BID   senna-docusate  1 tablet Oral BID   torsemide  80 mg Oral Daily   acetaminophen **OR** acetaminophen (TYLENOL) oral liquid 160 mg/5 mL **OR** acetaminophen, labetalol, magnesium hydroxide, metoCLOPramide (REGLAN) injection, nitroGLYCERIN, simethicone  Assessment/ Plan:  [name] is a 81 y.o.  female with past medical history including liver cirrhosis, anemia, diverticulitis, hypertension, CHF, CAD, and end stage renal disease on hemodialysis. She presents to the emergency department with confusion. She has been admitted for Subdural hemorrhage ([place]) [I62.00] Hyperammonemia (HCC) [E72.20] Encephalopathy [G93.40] Acute on chronic intracranial subdural hematoma (HCC) [I62.01, I62.03]  [facility]/TTS/right chest PermCath  End stage renal disease on hemodialysis.  Will maintain outpatient schedule if possible.  Next dialysis treatment scheduled for Tuesday.   2. Anemia of chronic kidney disease  Lab  Results  Component Value Date   HGB 7.3 (L) 03/06/2022  Hemoglobin not at goal, patient receives Mircera at outpatient clinic.  Will order EPO 10,000 units with dialysis.  3. Secondary Hyperparathyroidism:   Lab Results  Component Value Date   PTH 32 02/03/2022   CALCIUM 8.4 (L) 03/06/2022   CAION 1.17 03/25/2016   PHOS 3.4 02/07/2022    Calcium remains  within acceptable range.  We will continue to monitor.  4.  Subdural hemorrhage, seen on CT scan.  Neurosurgery consulted, no surgical interventions.    LOS: 3 Benjimin Hadden 11/6/20231:51 PM

## 2022-03-06 NOTE — Progress Notes (Signed)
Speech Language Pathology Treatment: Cognitive-Linquistic;Dysphagia  Patient Details Name: April [name] MRN: [id number] DOB: [birth year] Today's Date: 03/06/2022 Time: 0900-0920 SLP Time Calculation (min) (ACUTE ONLY): 20 min  Assessment / Plan / Recommendation Clinical Impression  Pt seen for follow up dysphagia and cognitive intervention. Regarding dysphagia, pt denied issue with advanced diet with dinner last night. Pt seen with trials of thin liquid via cup and puree solids. Advanced solids deferred by pt secondary to breakfast arriving soon. Oral phase functional for trials and no s/sx of aspiration. Utilizing menu, therapist identified items falling with soft solids considerations and education shared for rationale for soft solids. Continue to recommend Dys 3 with thin liquids and aspiration precautions: small bites, elevated HOB, alert, and slow rate.   Regarding cognitive communication intervention, compensatory strategies introduced to bolster independence with daily tasks, including environmental placement, single step completion, and double checking for errors. Modeling and min verbal cues bolstered application of strategies for tasks (finding phone, calling in breakfast order, and verbal sequencing). Education shared for further intervention in skilled nursing setting with pt reporting understanding and excitement for rehab. SLP will continue to follow.     HPI HPI: Pt is a 81 y.o. Caucasian female with medical history of Multiple medical problems including CKD, DM, CHF, Migraines, Large Hiatal Hernia, HTN, CAD, Spasmodic Dysphonia and Liver Cirrhosis, on HD who presented to the emergency room with acute onset of confusion and hypotension while having her hemodialysis session on Thursday.  Per her son, she has been fairly confused since Monday of this week but was able to finish hemodialysis on Tuesday.  Her confusion worsened since the night before dialysis.  She did not have any reported  chest pain or dyspnea or cough or wheezing.  No fever or chills.  No nausea or vomiting or abdominal pain.  She has not been taking her lactulose regularly due to frequent bowel movements with it.  No headache or dizziness or blurred vision.  No paresthesias or focal muscle weakness.  IMAGING: CT A/P 01/2022--  1. Cirrhotic liver with moderate volume ascites.  2. Small left and trace right pleural effusions with associated  compressive atelectasis.  3. Moderate-large hiatal hernia containing the majority of the  stomach within the chest.  4. Left-sided colonic diverticulosis without evidence for acute  diverticulitis.  5. Anasarca.  Head CT: Head ct: Stable 7 mm in thickness left frontotemporal convexity subdural  hematoma and 3 mm in thickness left tentorial subdural bleed. No new  or worsening hemorrhage.  2. 2 mm left-to-right midline shift of the septum pellucidum, no  downward mass effect.  3. Atrophy and small-vessel disease.  MD admitting dx included: Acute hepatic encephalopathy ([place])  - This is likely the culprit for her altered mental status and confusion rather than her subdural hematoma.  - It is likely due to noncompliance with lactulose.  - She will be placed on lactulose and rifaximin.      SLP Plan  Continue with current plan of care      Recommendations for follow up therapy are one component of a multi-disciplinary discharge planning process, led by the attending physician.  Recommendations may be updated based on patient status, additional functional criteria and insurance authorization.    Recommendations  Diet recommendations: Dysphagia 3 (mechanical soft);Thin liquid Liquids provided via: Straw;Cup Medication Administration: Whole meds with puree Supervision: Intermittent supervision to cue for compensatory strategies Compensations: Minimize environmental distractions;Slow rate;Small sips/bites Postural Changes and/or Swallow Maneuvers: Upright 30-60 min after meal;Seated  upright 90 degrees                Oral Care Recommendations: Oral care BID Follow Up Recommendations: Skilled nursing-short term rehab (<3 hours/day) Assistance recommended at discharge: Frequent or constant Supervision/Assistance SLP Visit Diagnosis: Dysphagia, unspecified (R13.10);Cognitive communication deficit (R41.841) Plan: Continue with current plan of care         [place] Ezri Landers Clapp  MS [facility] SLP   [place] J Clapp  03/06/2022, 9:22 AM

## 2022-03-06 NOTE — Procedures (Signed)
PROCEDURE SUMMARY:  Successful ultrasound guided therapeutic paracentesis from the RLQ. Yielded 4.4 L of clear, yellow fluid.  No immediate complications.  The patient tolerated the procedure well.   Specimen not sent for labs.  EBL < 1 mL  The patient has previously been evaluated by the [facility], and deemed not a candidate for intervention.     Narda Rutherford, AGNP-BC 03/06/2022, 4:30 PM

## 2022-03-06 NOTE — Care Management Important Message (Signed)
Important Message  Patient Details  Name: KIRSTIE LARSEN MRN: [id number] Date of Birth: 06/01/1940   Medicare Important Message Given:  Yes     Dannette Barbara 03/06/2022, 12:55 PM

## 2022-03-06 NOTE — TOC Progression Note (Signed)
Transition of Care [facility]) - Progression Note    Patient Details  Name: April [name] MRN: [id number] Date of Birth: [birth year]  Transition of Care [facility] CM/SW [place], LCSW Phone Number: 03/06/2022, 10:19 AM  Clinical Narrative:  [facility] can accept patient whenever stable but will need discharge orders and summary by 1:30 on whichever day she discharges due to pharmacy cut-off time. Sent secure chat to MD to notify.    Barriers to Discharge: Continued Medical Work up  Expected Discharge Plan and Services                           DME Arranged: N/A DME Agency: NA       HH Arranged: NA Powers Agency: NA         Social Determinants of Health (SDOH) Interventions    Readmission Risk Interventions    02/10/2022   11:11 AM 11/28/2021   11:17 AM 10/19/2021   12:27 PM  Readmission Risk Prevention Plan  Transportation Screening Complete Complete Complete  PCP or Specialist Appt within 3-5 Days  Complete Complete  HRI or [place]  Complete Complete  Social Work Consult for [place] Planning/Counseling  Complete Complete  Palliative Care Screening  Not Applicable Complete  Medication Review Press photographer) Complete Complete Complete  PCP or Specialist appointment within 3-5 days of discharge Complete    HRI or [employer] Complete    SW Recovery Care/Counseling Consult Complete    Portal Not Applicable

## 2022-03-06 NOTE — Progress Notes (Addendum)
  Progress Note   Patient: April [name] WIO:[id number] DOB: 10/23/1940 DOA: 03/02/2022     3 DOS: the patient was seen and examined on 03/06/2022   Brief hospital course: Patient is an 81 year old female with past medical history of end-stage renal disease, CAD status post angioplasty, diabetes mellitus, paroxysmal atrial fibrillation on Eliquis, sleep apnea, diastolic heart failure and [name] cirrhosis who has been noncompliant with her lactulose and presented to the emergency room on 11/2 with confusion.  During work-up, patient also found to have a left-sided subdural hematoma with small left to right midline shift.  Repeat scan on the early morning of 11/3 noted no change.  Patient was admitted to the hospitalist service and neurosurgery consulted.  Neurosurgery felt given patient's major medical comorbidities, not candidate for surgical intervention.  It was also felt that subdural hematoma was slightly subacute and predated change in mental status.  Patient noted to have elevated ammonia levels which were more likely felt to be because of mentation and GI consulted. 11/6.  Hemoglobin dropped down to 7.3, notified GI, started Protonix.  Transfuse 1 unit.  Assessment and Plan:   Subdural hemorrhage ([place] Fall at home secondary to altered mental status. Hepatic encephalopathy with altered mental status. Patient had a fall at home resulting in subdural hematoma.  Has been seen by neurosurgery, no surgical interventions required. Repeated CT scan head today showed stable subdural hematoma, no midline shift.  This is the improvement. Mental status has improved. Patient has initially had an increased procalcitonin level, but no other source of infection.  Repeated procalcitonin level had dropped down.  Acute on chronic anemia. Thrombocytopenia. Patient had a drop of hemoglobin from 8.3 to 7.3 today.  Reviewed previous EGD results, patient had AVM with previous GI bleed.  GI consult is obtained  again.  Transfuse 1 unit of PRBC.  Liver cirrhosis with ascites. Patient does not have any abdominal pain.  She has ascites on exam.  Will obtain paracentesis.   GERD without esophagitis PPI.   End-stage renal disease on hemodialysis (HCC) Hypokalemia Hyponatremia. Followed by nephrology for dialysis.  Given 40 mEq of KCl for potassium 3.3.   Paroxysmal atrial fibrillation (HCC) On amiodarone.  Appears in sinus.       Subjective:  Patient feels well, does not have any complaints.  Physical Exam: Vitals:   03/05/22 1917 03/06/22 0042 03/06/22 0423 03/06/22 0754  BP: (!) 119/49 (!) 108/53 (!) 109/46 91/77  Pulse: 83 83 78 77  Resp: '16 18 16 14  '$ Temp: 98.1 F (36.7 C) 97.9 F (36.6 C) 98.9 F (37.2 C) 97.7 F (36.5 C)  TempSrc:    Oral  SpO2: 97% 95% 95% 93%   General exam: Appears calm and comfortable  Respiratory system: Clear to auscultation. Respiratory effort normal. Cardiovascular system: S1 & S2 heard, RRR. No JVD, murmurs, rubs, gallops or clicks. No pedal edema. Gastrointestinal system: Abdomen is distended, soft and nontender. No organomegaly or masses felt. Normal bowel sounds heard. Central nervous system: Alert and oriented x3. No focal neurological deficits. Extremities: Symmetric 5 x 5 power. Skin: No rashes, lesions or ulcers Psychiatry:  Mood & affect appropriate.   Data Reviewed:  Lab results reviewed.  Family Communication: Daughter-in-law updated at bedside.  Disposition: Status is: Inpatient Remains inpatient appropriate because: Severity of disease, IV treatment.  Planned Discharge Destination: Skilled nursing facility    Time spent: 35 minutes  Author: Sharen Hones, MD 03/06/2022 11:17 AM  For on call review [email or web address].

## 2022-03-07 DIAGNOSIS — K922 Gastrointestinal hemorrhage, unspecified: Secondary | ICD-10-CM

## 2022-03-07 DIAGNOSIS — I6200 Nontraumatic subdural hemorrhage, unspecified: Secondary | ICD-10-CM | POA: Diagnosis not present

## 2022-03-07 LAB — HEMOGLOBIN
Hemoglobin: 9.6 g/dL — ABNORMAL LOW (ref 12.0–15.0)
Hemoglobin: 10.4 g/dL — ABNORMAL LOW (ref 12.0–15.0)

## 2022-03-07 LAB — BASIC METABOLIC PANEL
Anion gap: 8 (ref 5–15)
BUN: 22 mg/dL (ref 8–23)
CO2: 25 mmol/L (ref 22–32)
Calcium: 8.6 mg/dL — ABNORMAL LOW (ref 8.9–10.3)
Chloride: 97 mmol/L — ABNORMAL LOW (ref 98–111)
Creatinine, Ser: 3.72 mg/dL — ABNORMAL HIGH (ref 0.44–1.00)
GFR, Estimated: 12 mL/min — ABNORMAL LOW (ref >60)
Glucose, Bld: 120 mg/dL — ABNORMAL HIGH (ref 70–99)
Potassium: 3.6 mmol/L (ref 3.5–5.1)
Sodium: 130 mmol/L — ABNORMAL LOW (ref 135–145)

## 2022-03-07 LAB — CBC
HCT: 27.4 % — ABNORMAL LOW (ref 36.0–46.0)
Hemoglobin: 9.4 g/dL — ABNORMAL LOW (ref 12.0–15.0)
MCH: 33.1 pg (ref 26.0–34.0)
MCHC: 34.3 g/dL (ref 30.0–36.0)
MCV: 96.5 fL (ref 80.0–100.0)
Platelets: 76 10*3/uL — ABNORMAL LOW (ref 150–400)
RBC: 2.84 MIL/uL — ABNORMAL LOW (ref 3.87–5.11)
RDW: 20.5 % — ABNORMAL HIGH (ref 11.5–15.5)
WBC: 17.5 10*3/uL — ABNORMAL HIGH (ref 4.0–10.5)
nRBC: 0.5 % — ABNORMAL HIGH (ref 0.0–0.2)

## 2022-03-07 LAB — TYPE AND SCREEN
ABO/RH(D): A POS
Antibody Screen: NEGATIVE
Unit division: 0

## 2022-03-07 LAB — BPAM RBC
Blood Product Expiration Date: 202312032359
ISSUE DATE / TIME: 202311062018
Unit Type and Rh: 6200

## 2022-03-07 LAB — GLUCOSE, CAPILLARY: Glucose-Capillary: 160 mg/dL — ABNORMAL HIGH (ref 70–99)

## 2022-03-07 MED ORDER — SIMETHICONE 80 MG PO CHEW: 80.0000 mg | CHEWABLE_TABLET | Freq: Four times a day (QID) | ORAL | 0 refills | Status: AC | PRN

## 2022-03-07 MED ORDER — LACTULOSE 10 GM/15ML PO SOLN: 30.0000 g | Freq: Three times a day (TID) | ORAL | 0 refills | Status: AC

## 2022-03-07 NOTE — Progress Notes (Signed)
[facility]  ROUNDING NOTE   Subjective:   April [name] is a 81 year old female with past medical history including liver cirrhosis, anemia, diverticulitis, hypertension, CHF, CAD, and end stage renal disease on hemodialysis. She presents to the emergency department with confusion. She has been admitted for Subdural hemorrhage (HCC) [I62.00] Hyperammonemia ([place]) [E72.20] Encephalopathy [G93.40] Acute on chronic intracranial subdural hematoma (HCC) [I62.01, I62.03]  This patient is known to our clinic and received outpatient dialysis treatments at [facility] on a TTS schedule, supervised by Dr. Candiss Norse.    Patient seen resting quietly, no family at bedside Appears in good spirits today Ate 50% of breakfast Mild lower extremity edema  Objective:  Vital signs in last 24 hours:  Temp:  [97.9 F (36.6 C)-99 F (37.2 C)] 98.5 F (36.9 C) (11/07 1152) Pulse Rate:  [70-86] 83 (11/07 1152) Resp:  [12-18] 17 (11/07 1152) BP: (91-117)/(38-60) 96/45 (11/07 1152) SpO2:  [94 %-100 %] 96 % (11/07 1152)  Weight change:  There were no vitals filed for this visit.   Intake/Output: I/O last 3 completed shifts: In: 1562.8 [I.V.:118; Blood:1031.2; IV Piggyback:413.6] Out: -    Intake/Output this shift:  No intake/output data recorded.  Physical Exam: General: NAD, resting comfortably  Head: Normocephalic, atraumatic. Moist oral mucosal membranes  Eyes: Anicteric  Lungs:  Clear to auscultation, normal effort, room air  Heart: Irregular rate and rhythm, no murmurs  Abdomen:  Soft, nontender  Extremities:  1+  peripheral edema  Neurologic: Alert, oriented to self  Skin: No lesions  Access: Rt chest permcath    Basic Metabolic Panel: Recent Labs  Lab 03/02/22 1426 03/06/22 0350 03/06/22 0808 03/07/22 0657  NA 138 132* 132* 130*  K 4.6 3.2* 3.3* 3.6  CL 103 96* 97* 97*  CO2 '25 26 27 25  '$ GLUCOSE 186* 134* 109* 120*  BUN '14 17 19 22  '$ CREATININE 2.65* 2.90* 3.13*  3.72*  CALCIUM 8.2* 8.5* 8.4* 8.6*     Liver Function Tests: Recent Labs  Lab 03/02/22 1426  AST 93*  ALT 40  ALKPHOS 120  BILITOT 2.0*  PROT 5.6*  ALBUMIN 2.2*    No results for input(s): "LIPASE", "AMYLASE" in the last 168 hours. Recent Labs  Lab 03/03/22 0543 03/04/22 0642 03/05/22 0552  AMMONIA 63* 44* 37*      CBC: Recent Labs  Lab 03/02/22 1426 03/06/22 0350 03/06/22 0808 03/06/22 2244 03/07/22 0657  WBC 21.2* 14.3* 15.2*  --  17.5*  HGB 8.3* 7.5* 7.3* 9.3* 9.4*  HCT 25.2* 22.9* 22.6*  --  27.4*  MCV 100.8* 100.0 100.9*  --  96.5  PLT 124* 84* 83*  --  76*     Cardiac Enzymes: No results for input(s): "CKTOTAL", "CKMB", "CKMBINDEX", "TROPONINI" in the last 168 hours.  BNP: Invalid input(s): "POCBNP"  CBG: No results for input(s): "GLUCAP" in the last 168 hours.  Microbiology: Results for orders placed or performed in visit on 03/06/22  Body fluid culture w Gram Stain     Status: None (Preliminary result)   Collection Time: 03/06/22  3:51 PM   Specimen: PATH Cytology Peritoneal fluid  Result Value Ref Range Status   Specimen Description   Final    PERITONEAL Performed at [facility], [address], [place], [place] [address]    Special Requests   Final    NONE Performed at [facility], [address], [place], [place] [address]    Gram Stain  Final    WBC PRESENT, PREDOMINANTLY MONONUCLEAR NO ORGANISMS SEEN CYTOSPIN SMEAR    Culture   Final    NO GROWTH < 24 HOURS Performed at [place] [address], [place], [place] [address]    Report Status PENDING  Incomplete    Coagulation Studies: No results for input(s): "LABPROT", "INR" in the last 72 hours.    Urinalysis: No results for input(s): "COLORURINE", "LABSPEC", "PHURINE", "GLUCOSEU", "HGBUR", "BILIRUBINUR", "KETONESUR", "PROTEINUR", "UROBILINOGEN", "NITRITE", "LEUKOCYTESUR" in the last 72 hours.  Invalid input(s): "APPERANCEUR"      Imaging: US Paracentesis  Result Date: 03/06/2022 INDICATION: Patient with history of cirrhosis and recurrent ascites. Request received to perform therapeutic paracentesis. EXAM: ULTRASOUND GUIDED THERAPEUTIC RIGHT LOWER QUADRANT PARACENTESIS MEDICATIONS: 10 mL 1 % lidocaine COMPLICATIONS: None immediate. PROCEDURE: Informed written consent was obtained from the patient after a discussion of the risks, benefits and alternatives to treatment. A timeout was performed prior to the initiation of the procedure. Initial ultrasound scanning demonstrates a large amount of ascites within the right lower abdominal quadrant. The right lower abdomen was prepped and draped in the usual sterile fashion. 1% lidocaine was used for local anesthesia. Following this, a 19 gauge, 7-cm, Yueh catheter was introduced. An ultrasound image was saved for documentation purposes. The paracentesis was performed. The catheter was removed and a dressing was applied. The patient tolerated the procedure well without immediate post procedural complication. FINDINGS: A total of approximately 4.4 L of clear, yellow fluid was removed. IMPRESSION: Successful ultrasound-guided paracentesis yielding 4.4 liters of peritoneal fluid. Read by: Narda Rutherford, AGNP-BC PLAN: The patient has previously been evaluated by the [facility] Interventional Radiology Portal Hypertension Clinic, and deemed not a candidate for intervention. Michaelle Birks, MD Vascular and Interventional Radiology Specialists [facility] Radiology Electronically Signed   By: Michaelle Birks M.D.   On: 03/06/2022 16:45   CT HEAD WO CONTRAST (5MM)  Result Date: 03/06/2022 CLINICAL DATA:  Subdural hematoma EXAM: CT HEAD WITHOUT CONTRAST TECHNIQUE: Contiguous axial images were obtained from the base of the skull through the vertex without intravenous contrast. RADIATION DOSE REDUCTION: This exam was performed according to the departmental dose-optimization program which includes automated  exposure control, adjustment of the mA and/or kV according to patient size and/or use of iterative reconstruction technique. COMPARISON:  CT head 03/03/2022 FINDINGS: Brain: The left cerebral convexity subdural hematoma measuring up to proximally 8 mm in maximal thickness in the coronal plane is not significantly changed in size when remeasured using similar technique and is overall decreased in density, consistent with evolving blood products. The component of blood along the left tentorial leaflet is decreased in conspicuity. There is no new hyperdense blood within the collection to suggest acute/interval bleed. There is minimal mass effect on the underlying brain parenchyma with no midline shift. There is no new acute intracranial hemorrhage or extra-axial fluid collection. There is no acute territorial infarct. The ventricles are stable in size.  There is no solid mass lesion. Vascular: There is calcification of the bilateral carotid siphons and vertebral arteries. Skull: Normal. Negative for fracture or focal lesion. Sinuses/Orbits: The imaged paranasal sinuses are clear. Bilateral lens implants are in place. The globes and orbits are otherwise unremarkable. Other: The calcified left parieto-occipital scalp lesion is unchanged. IMPRESSION: Expected evolution of 8 mm maximally thick left cerebral convexity subdural hematoma with no midline shift. No new acute intracranial pathology. Electronically Signed   By: Valetta Mole M.D.   On: 03/06/2022 09:44  Medications:      amiodarone  200 mg Oral Daily   Chlorhexidine Gluconate Cloth  6 each Topical Q0600   epoetin (EPOGEN/PROCRIT) injection  10,000 Units Intravenous Q T,Th,Sa-HD   lactulose  30 g Oral TID   midodrine  10 mg Oral TID WC   multivitamin with minerals  1 tablet Oral Daily   pantoprazole (PROTONIX) IV  40 mg Intravenous Q12H   rifaximin  550 mg Oral BID   senna-docusate  1 tablet Oral BID   torsemide  80 mg Oral Daily    acetaminophen **OR** acetaminophen (TYLENOL) oral liquid 160 mg/5 mL **OR** acetaminophen, labetalol, magnesium hydroxide, metoCLOPramide (REGLAN) injection, nitroGLYCERIN, simethicone  Assessment/ Plan:  April [name] is a 81 y.o.  female with past medical history including liver cirrhosis, anemia, diverticulitis, hypertension, CHF, CAD, and end stage renal disease on hemodialysis. She presents to the emergency department with confusion. She has been admitted for Subdural hemorrhage ([place]) [I62.00] Hyperammonemia (HCC) [E72.20] Encephalopathy [G93.40] Acute on chronic intracranial subdural hematoma (HCC) [I62.01, I62.03]  [facility]/TTS/right chest PermCath  End stage renal disease on hemodialysis.  Will maintain outpatient schedule if possible.  Scheduled to receive dialysis later today, UF goal 1.5-2L as tolerated. Next treatment scheduled for Thursday.   2. Anemia of chronic kidney disease  Lab Results  Component Value Date   HGB 9.4 (L) 03/07/2022  Hemoglobin not at goal, patient receives Mircera at outpatient clinic.  EPO 10,000 units with dialysis.  3. Secondary Hyperparathyroidism:   Lab Results  Component Value Date   PTH 32 02/03/2022   CALCIUM 8.6 (L) 03/07/2022   CAION 1.17 03/25/2016   PHOS 3.4 02/07/2022    We will continue to monitor bone minerals during this admission.   4.  Subdural hemorrhage, seen on CT scan.  Neurosurgery consulted, no surgical interventions.    LOS: 4 Joane Postel 11/7/202311:57 AM

## 2022-03-07 NOTE — TOC Progression Note (Addendum)
Transition of Care [facility]) - Progression Note    Patient Details  Name: MADDISYN HEGWOOD MRN: [id number] Date of Birth: [birth year]  Transition of Care [facility]) CM/SW [place], LCSW Phone Number: 03/07/2022, 9:42 AM  Clinical Narrative:  Left voicemail for Amado Coe at [facility] to let her know that discharge is in. Asked her to review and call back with time transport can be arranged.   9:48 am: Per nephrology, patient was supposed to go to HD early but there was an emergency so they had to delay her start time. She will likely go down between 12:00-1:00. Son is aware.  9:53 am: Received call back from [facility]. Provided update.  11:44 am: Went by room to update patient. She does not want to transport by ambulance and said son has access to a wheelchair van. Called son and confirmed. He said if [facility] transport her in their [place], he can arrange to pick her up in a friend's wheelchair van.  12:36 pm: SNF unable to transport. They have notified son and said he is going to check to see if he can borrow friend's Lucianne Lei.    Barriers to Discharge: Continued Medical Work up  Expected Discharge Plan and Services           Expected Discharge Date: 03/07/22               DME Arranged: N/A DME Agency: NA       HH Arranged: NA HH Agency: NA         Social Determinants of Health (SDOH) Interventions    Readmission Risk Interventions    02/10/2022   11:11 AM 11/28/2021   11:17 AM 10/19/2021   12:27 PM  Readmission Risk Prevention Plan  Transportation Screening Complete Complete Complete  PCP or Specialist Appt within 3-5 Days  Complete Complete  HRI or [employer]  Complete Complete  Social Work Consult for [place] Planning/Counseling  Complete Complete  Palliative Care Screening  Not Applicable Complete  Medication Review Press [employer]) Complete Complete Complete  PCP or Specialist appointment within 3-5 days of  discharge Complete    HRI or [place] Complete    SW Recovery Care/Counseling Consult Complete    [place] Not Applicable

## 2022-03-07 NOTE — Progress Notes (Signed)
Lucilla Lame, MD [facility]   [address], [place], [place] [address] Phone: [phone] Fax : [phone]   Subjective: Asked to see this patient again for a drop in her hemoglobin.  The patient was transfused 1 unit of blood overnight.  The patient's hemoglobin prior to the transfusion was stable and her trends are as follows:  Component     Latest Ref Rng 02/11/2022 02/12/2022 02/20/2022  Hemoglobin     12.0 - 15.0 g/dL 7.6 (L)  7.5 (L)  8.7 Repeated and verified X2. (L)    Component     Latest Ref Rng 03/02/2022 03/06/2022 03/07/2022  Hemoglobin     12.0 - 15.0 g/dL 8.3 (L)  9.3 (L)  9.4 (L)   Hemoglobin       7.3 (L)    Hemoglobin       7.5 (L)      This been no report of any sign of active bleeding.  Objective: Vital signs in last 24 hours: Vitals:   03/07/22 0107 03/07/22 0547 03/07/22 0821 03/07/22 1152  BP: (!) 116/44 (!) 91/38 (!) 97/44 (!) 96/45  Pulse: 77 80 80 83  Resp: '18 18 17 17  '$ Temp: 98.5 F (36.9 C) 99 F (37.2 C) 98.4 F (36.9 C) 98.5 F (36.9 C)  TempSrc:      SpO2: 100% 99% 97% 96%   Weight change:   Intake/Output Summary (Last 24 hours) at 03/07/2022 1329 Last data filed at 03/07/2022 0351 Gross per 24 hour  Intake 1562.79 ml  Output --  Net 1562.79 ml     Exam: Heart:: Regular rate and rhythm, S1S2 present, or without murmur or extra heart sounds Lungs: normal and clear to auscultation and percussion Abdomen: soft, nontender, normal bowel sounds   Lab Results: '@LABTEST2'$ @ Micro Results: Recent Results (from the past 240 hour(s))  Body fluid culture w Gram Stain     Status: None (Preliminary result)   Collection Time: 03/06/22  3:51 PM   Specimen: PATH Cytology Peritoneal fluid  Result Value Ref Range Status   Specimen Description   Final    PERITONEAL Performed at [facility], [address], [place], [place] [address]    Special Requests   Final    NONE Performed at [facility], [place] [place], [place] [address]    Gram Stain   Final    WBC PRESENT, PREDOMINANTLY MONONUCLEAR NO ORGANISMS SEEN CYTOSPIN SMEAR    Culture   Final    NO GROWTH < 24 HOURS Performed at [facility], [place] [address] [place], [place] [address]    Report Status PENDING  Incomplete   Studies/Results: US Paracentesis  Result Date: 03/06/2022 INDICATION: Patient with history of cirrhosis and recurrent ascites. Request received to perform therapeutic paracentesis. EXAM: ULTRASOUND GUIDED THERAPEUTIC RIGHT LOWER QUADRANT PARACENTESIS MEDICATIONS: 10 mL 1 % lidocaine COMPLICATIONS: None immediate. PROCEDURE: Informed written consent was obtained from the patient after a discussion of the risks, benefits and alternatives to treatment. A timeout was performed prior to the initiation of the procedure. Initial ultrasound scanning demonstrates a large amount of ascites within the right lower abdominal quadrant. The right lower abdomen was prepped and draped in the usual sterile fashion. 1% lidocaine was used for local anesthesia. Following this, a 19 gauge, 7-cm, Yueh catheter was introduced. An ultrasound image was saved for documentation purposes. The paracentesis was performed. The catheter was removed and a dressing was applied. The patient tolerated  the procedure well without immediate post procedural complication. FINDINGS: A total of approximately 4.4 L of clear, yellow fluid was removed. IMPRESSION: Successful ultrasound-guided paracentesis yielding 4.4 liters of peritoneal fluid. Read by: Narda Rutherford, AGNP-BC PLAN: The patient has previously been evaluated by the [facility] Interventional Radiology Portal Hypertension Clinic, and deemed not a candidate for intervention. Michaelle Birks, MD Vascular and Interventional Radiology Specialists [facility] Radiology Electronically Signed   By: Michaelle Birks M.D.   On: 03/06/2022 16:45   CT HEAD WO CONTRAST (5MM)  Result Date: 03/06/2022 CLINICAL DATA:  Subdural  hematoma EXAM: CT HEAD WITHOUT CONTRAST TECHNIQUE: Contiguous axial images were obtained from the base of the skull through the vertex without intravenous contrast. RADIATION DOSE REDUCTION: This exam was performed according to the departmental dose-optimization program which includes automated exposure control, adjustment of the mA and/or kV according to patient size and/or use of iterative reconstruction technique. COMPARISON:  CT head 03/03/2022 FINDINGS: Brain: The left cerebral convexity subdural hematoma measuring up to proximally 8 mm in maximal thickness in the coronal plane is not significantly changed in size when remeasured using similar technique and is overall decreased in density, consistent with evolving blood products. The component of blood along the left tentorial leaflet is decreased in conspicuity. There is no new hyperdense blood within the collection to suggest acute/interval bleed. There is minimal mass effect on the underlying brain parenchyma with no midline shift. There is no new acute intracranial hemorrhage or extra-axial fluid collection. There is no acute territorial infarct. The ventricles are stable in size.  There is no solid mass lesion. Vascular: There is calcification of the bilateral carotid siphons and vertebral arteries. Skull: Normal. Negative for fracture or focal lesion. Sinuses/Orbits: The imaged paranasal sinuses are clear. Bilateral lens implants are in place. The globes and orbits are otherwise unremarkable. Other: The calcified left parieto-occipital scalp lesion is unchanged. IMPRESSION: Expected evolution of 8 mm maximally thick left cerebral convexity subdural hematoma with no midline shift. No new acute intracranial pathology. Electronically Signed   By: Valetta Mole M.D.   On: 03/06/2022 09:44   Medications: I have reviewed the patient's current medications. Scheduled Meds:  amiodarone  200 mg Oral Daily   Chlorhexidine Gluconate Cloth  6 each Topical Q0600    epoetin (EPOGEN/PROCRIT) injection  10,000 Units Intravenous Q T,Th,Sa-HD   lactulose  30 g Oral TID   midodrine  10 mg Oral TID WC   multivitamin with minerals  1 tablet Oral Daily   pantoprazole (PROTONIX) IV  40 mg Intravenous Q12H   rifaximin  550 mg Oral BID   senna-docusate  1 tablet Oral BID   torsemide  80 mg Oral Daily   Continuous Infusions: PRN Meds:.acetaminophen **OR** acetaminophen (TYLENOL) oral liquid 160 mg/5 mL **OR** acetaminophen, labetalol, magnesium hydroxide, metoCLOPramide (REGLAN) injection, nitroGLYCERIN, simethicone   Assessment: Principal Problem:   Subdural hemorrhage (HCC) Active Problems:   Paroxysmal atrial fibrillation (HCC)   Symptomatic anemia   Chronic obstructive pulmonary disease, unspecified COPD type (HCC)   Chronic diastolic CHF (congestive heart failure) (HCC)   Hypokalemia   Hyponatremia   Acute hepatic encephalopathy (HCC)   Cirrhosis of liver with ascites (HCC)   Thrombocytopenia (HCC)   HTN (hypertension)   Prolonged QT interval   End-stage renal disease on hemodialysis ([place])   GERD without esophagitis    Plan: This patient's hemoglobin has been stable without any sign of any active bleeding.  She appropriately increased her hemoglobin after transfusion.  I do  not recommend any further evaluation of this patient at this time considering her most recent diagnosis of a subdural hematoma without any sign of acute bleeding.   LOS: 4 days   Lewayne Bunting 03/07/2022, 1:29 PM Pager [phone] 7am-5pm  Check AMION for 5pm -7am coverage and on weekends

## 2022-03-07 NOTE — Progress Notes (Signed)
PT did 3.5 hrs of HD, tolerated well but unable to reach goal  Due to BP dropping.  UF = 177m  Report given to floor RN.   03/07/22 1837  Vitals  Temp 97.8 F (36.6 C)  Temp Source Oral  BP Location Left Arm  BP Method Automatic  Patient Position (if appropriate) Lying  Pulse Rate Source Monitor  Oxygen Therapy  O2 Device Room Air  Hepatitis B Pre Treatment Patient Checks  Hep B Antibody (Hep B Post) Results -3.1  Post Treatment  Dialyzer Clearance Lightly streaked  Duration of HD Treatment -hour(s) 3.5 hour(s)  Hemodialysis Intake (mL) 0 mL  Liters Processed 84  Fluid Removed (mL) 100 mL  Tolerated HD Treatment Yes  Post-Hemodialysis Comments unable to reach goal, bp too low  Hemodialysis Catheter Right Internal jugular Double lumen Permanent (Tunneled)  Placement Date/Time: 12/15/21 1059   Time Out: Correct patient;Correct procedure;Correct site  Maximum sterile barrier precautions: Hand hygiene;Sterile gloves;Cap;Large sterile sheet;Mask;Sterile gown  Site Prep: Chlorhexidine (preferred)  Local Anes...  Site Condition No complications  Blue Lumen Status Flushed;Dead end cap in place;Heparin locked  Red Lumen Status Flushed;Dead end cap in place;Heparin locked  Purple Lumen Status N/A  Catheter fill solution Heparin 1000 units/ml  Catheter fill volume (Arterial) 2 cc  Catheter fill volume (Venous) 2.2  Dressing Type Transparent  Dressing Status Antimicrobial disc in place  Interventions New dressing  Drainage Description None  Dressing Change Due 03/10/22  Post treatment catheter status Capped and Clamped

## 2022-03-07 NOTE — Progress Notes (Signed)
[facility]) Hospital Liaison note:  This is a pending outpatient-based Palliative Care patient. Will continue to follow for disposition.  Please call with any outpatient palliative questions or concerns.  Thank you, Lorelee Market, LPN [facility] Liaison [phone]

## 2022-03-07 NOTE — TOC Transition Note (Signed)
Transition of Care [facility]) - CM/SW Discharge Note   Patient Details  Name: April [name] MRN: [id number] Date of Birth: [birth year]  Transition of Care [facility]) CM/SW Contact:  Candie Chroman, LCSW Phone Number: 03/07/2022, 3:07 PM   Clinical Narrative:  Patient has orders to discharge to [facility] SNF today. RN will call report to [phone] (Room 332). Son will pick her up and transport by wheelchair van. No further concerns. CSW signing off.   Final next level of care: Skilled Nursing Facility Barriers to Discharge: Barriers Resolved   Patient Goals and CMS Choice   CMS Medicare.gov Compare Post Acute Care list provided to:: Other (Comment Required) (Son April [name]) Choice offered to / list presented to : Patient, Adult Children  Discharge Placement   Existing PASRR number confirmed : 03/05/22          Patient chooses bed at: [facility] Patient to be transferred to facility by: Son Name of family member notified: April [name] Patient and family notified of of transfer: 03/07/22  Discharge Plan and Services                DME Arranged: N/A DME Agency: NA       HH Arranged: NA [place] Agency: NA        Social Determinants of Health (SDOH) Interventions     Readmission Risk Interventions    02/10/2022   11:11 AM 11/28/2021   11:17 AM 10/19/2021   12:27 PM  Readmission Risk Prevention Plan  Transportation Screening Complete Complete Complete  PCP or Specialist Appt within 3-5 Days  Complete Complete  HRI or [place]  Complete Complete  Social Work Consult for Standard Planning/Counseling  Complete Complete  Palliative Care Screening  Not Applicable Complete  Medication Review Press photographer) Complete Complete Complete  PCP or Specialist appointment within 3-5 days of discharge Complete    HRI or [place] Complete    SW Recovery Care/Counseling Consult Complete    [place] Not Applicable

## 2022-03-07 NOTE — Discharge Summary (Signed)
Physician Discharge Summary   Patient: April [name] MRN: [id number] DOB: 04-Mar-1941  Admit date:     03/02/2022  Discharge date: 03/07/22  Discharge Physician: Sharen Hones   PCP: Einar Pheasant, MD   Recommendations at discharge:   Follow-up with PCP in 1 week. Continue outpatient dialysis. Continue scheduled paracentesis. Follow-up with neurosurgery in 2 weeks. BMP and magnesium weekly.    Discharge Diagnoses: Principal Problem:   Subdural hemorrhage ([place]) Active Problems:   Acute hepatic encephalopathy (HCC)   Symptomatic anemia   Chronic diastolic CHF (congestive heart failure) (HCC)   Cirrhosis of liver with ascites (HCC)   HTN (hypertension)   Paroxysmal atrial fibrillation (HCC)   Chronic obstructive pulmonary disease, unspecified COPD type (HCC)   Hypokalemia   Hyponatremia   Thrombocytopenia (HCC)   Prolonged QT interval   End-stage renal disease on hemodialysis ([place])   GERD without esophagitis  Resolved Problems:   * No resolved hospital problems. [facility] Course: Patient is an 81 year old female with past medical history of end-stage renal disease, CAD status post angioplasty, diabetes mellitus, paroxysmal atrial fibrillation on Eliquis, sleep apnea, diastolic heart failure and April [name] cirrhosis who has been noncompliant with her lactulose and presented to the emergency room on 11/2 with confusion.  During work-up, patient also found to have a left-sided subdural hematoma with small left to right midline shift.  Repeat scan on the early morning of 11/3 noted no change.  Patient was admitted to the hospitalist service and neurosurgery consulted.  Neurosurgery felt given patient's major medical comorbidities, not candidate for surgical intervention.  It was also felt that subdural hematoma was slightly subacute and predated change in mental status.  Patient noted to have elevated ammonia levels which were more likely felt to be because of mentation and GI  consulted. 11/6.  Hemoglobin dropped down to 7.3, notified GI, started Protonix.  Transfuse 1 unit. Patient received 1 unit PRBC, hemoglobin increased to 9.4, initial drop of hemoglobin appears to be lab error.  No suspicion for any GI bleed at this time. Assessment and Plan: Subdural hemorrhage ([place] at home secondary to altered mental status. Hepatic encephalopathy with altered mental status. Patient had a fall at home resulting in subdural hematoma.  Has been seen by neurosurgery, no surgical interventions required. Repeated CT scan head today showed stable subdural hematoma, no midline shift.  This is the improvement. Mental status has improved. Patient has initially had an increased procalcitonin level, but no other source of infection.  Repeated procalcitonin level had dropped down.   Acute on chronic anemia. Thrombocytopenia. Chronic anemia, hemoglobin was running between 7.5-8.5.  Sudden drop of hemoglobin appears to be a lab error.   Liver cirrhosis with ascites. Patient does not have any abdominal pain.  Paracentesis was performed on 11/6, removed 4.4 L.  Continue scheduled paracentesis as outpatient.   GERD without esophagitis PPI.   End-stage renal disease on hemodialysis (HCC) Hypokalemia Hyponatremia. Followed by nephrology for dialysis, TTS.  Potassium normalized today.   Paroxysmal atrial fibrillation (HCC) On amiodarone.  Appears in sinus.       Consultants: Nephrology Procedures performed: HD, paracentesis. Disposition: Skilled nursing facility Diet recommendation:  Discharge Diet Orders (From admission, onward)     Start     Ordered   03/07/22 0000  Diet general       Comments: Dys 3, renal diet   03/07/22 0932           Renal diet DISCHARGE MEDICATION: Allergies as  of 03/07/2022       Reactions   Penicillins Other (See Comments)   Okay to take amoxicillin/(pt does not recall what the reaction to penicillin was (105-88 years old)   Sulfa  Antibiotics Rash        Medication List     STOP taking these medications    docusate sodium 100 MG capsule Commonly known as: Stool Softener   ipratropium-albuterol 0.5-2.5 (3) MG/3ML Soln Commonly known as: DUONEB       TAKE these medications    acetaminophen 325 MG tablet Commonly known as: TYLENOL Take 650 mg by mouth every 4 (four) hours as needed for mild pain or fever.   amiodarone 200 MG tablet Commonly known as: PACERONE Take 1 tablet (200 mg total) by mouth daily.   epoetin alfa 10000 UNIT/ML injection Commonly known as: EPOGEN Inject 0.8 mLs (8,000 Units total) into the vein Every Tuesday,Thursday,and Saturday with dialysis.   hydrocortisone 25 MG suppository Commonly known as: ANUSOL-HC Place 25 mg rectally 2 (two) times daily.   lactulose 10 GM/15ML solution Commonly known as: CHRONULAC Take 45 mLs (30 g total) by mouth 3 (three) times daily.   midodrine 10 MG tablet Commonly known as: PROAMATINE Take 1 tablet (10 mg total) by mouth 3 (three) times daily with meals.   multivitamin with minerals tablet Take 1 tablet by mouth daily.   nitroGLYCERIN 0.4 MG SL tablet Commonly known as: Nitrostat Place 1 tablet (0.4 mg total) under the tongue every 5 (five) minutes as needed for chest pain.   pantoprazole 40 MG tablet Commonly known as: PROTONIX Take 1 tablet (40 mg total) by mouth 2 (two) times daily before a meal.   rifaximin 550 MG Tabs tablet Commonly known as: XIFAXAN Take 1 tablet (550 mg total) by mouth 2 (two) times daily.   simethicone 80 MG chewable tablet Commonly known as: MYLICON Chew 1 tablet (80 mg total) by mouth 4 (four) times daily as needed for flatulence.   Torsemide 40 MG Tabs Take 80 mg by mouth daily.        Follow-up Information     Einar Pheasant, MD Follow up in 1 week(s).   Specialty: Internal Medicine Contact information: [address] [place] [address] [phone]          Meade Maw, MD Follow up in 2 week(s).   Specialty: Neurosurgery Contact information: [address] [place] [address] [phone]                Discharge Exam: There were no vitals filed for this visit. General exam: Appears calm and comfortable  Respiratory system: Clear to auscultation. Respiratory effort normal. Cardiovascular system: S1 & S2 heard, RRR. No JVD, murmurs, rubs, gallops or clicks. No pedal edema. Gastrointestinal system: Abdomen is nondistended, soft and nontender. No organomegaly or masses felt. Normal bowel sounds heard. Central nervous system: Alert and oriented. No focal neurological deficits. Extremities: Symmetric 5 x 5 power. Skin: No rashes, lesions or ulcers Psychiatry: Judgement and insight appear normal. Mood & affect appropriate.    Condition at discharge: good  The results of significant diagnostics from this hospitalization (including imaging, microbiology, ancillary and laboratory) are listed below for reference.   Imaging Studies: US Paracentesis  Result Date: 03/06/2022 INDICATION: Patient with history of cirrhosis and recurrent ascites. Request received to perform therapeutic paracentesis. EXAM: ULTRASOUND GUIDED THERAPEUTIC RIGHT LOWER QUADRANT PARACENTESIS MEDICATIONS: 10 mL 1 % lidocaine COMPLICATIONS: None immediate. PROCEDURE: Informed written consent  was obtained from the patient after a discussion of the risks, benefits and alternatives to treatment. A timeout was performed prior to the initiation of the procedure. Initial ultrasound scanning demonstrates a large amount of ascites within the right lower abdominal quadrant. The right lower abdomen was prepped and draped in the usual sterile fashion. 1% lidocaine was used for local anesthesia. Following this, a 19 gauge, 7-cm, Yueh catheter was introduced. An ultrasound image was saved for documentation purposes. The paracentesis was performed. The catheter  was removed and a dressing was applied. The patient tolerated the procedure well without immediate post procedural complication. FINDINGS: A total of approximately 4.4 L of clear, yellow fluid was removed. IMPRESSION: Successful ultrasound-guided paracentesis yielding 4.4 liters of peritoneal fluid. Read by: Narda Rutherford, AGNP-BC PLAN: The patient has previously been evaluated by the [facility] Interventional Radiology Portal Hypertension [facility], and deemed not a candidate for intervention. Michaelle Birks, MD [facility] Radiology Electronically Signed   By: Michaelle Birks M.D.   On: 03/06/2022 16:45   CT HEAD WO CONTRAST (5MM)  Result Date: 03/06/2022 CLINICAL DATA:  Subdural hematoma EXAM: CT HEAD WITHOUT CONTRAST TECHNIQUE: Contiguous axial images were obtained from the base of the skull through the vertex without intravenous contrast. RADIATION DOSE REDUCTION: This exam was performed according to the departmental dose-optimization program which includes automated exposure control, adjustment of the mA and/or kV according to patient size and/or use of iterative reconstruction technique. COMPARISON:  CT head 03/03/2022 FINDINGS: Brain: The left cerebral convexity subdural hematoma measuring up to proximally 8 mm in maximal thickness in the coronal plane is not significantly changed in size when remeasured using similar technique and is overall decreased in density, consistent with evolving blood products. The component of blood along the left tentorial leaflet is decreased in conspicuity. There is no new hyperdense blood within the collection to suggest acute/interval bleed. There is minimal mass effect on the underlying brain parenchyma with no midline shift. There is no new acute intracranial hemorrhage or extra-axial fluid collection. There is no acute territorial infarct. The ventricles are stable in size.  There is no solid mass lesion. Vascular: There is  calcification of the bilateral carotid siphons and vertebral arteries. Skull: Normal. Negative for fracture or focal lesion. Sinuses/Orbits: The imaged paranasal sinuses are clear. Bilateral lens implants are in place. The globes and orbits are otherwise unremarkable. Other: The calcified left parieto-occipital scalp lesion is unchanged. IMPRESSION: Expected evolution of 8 mm maximally thick left cerebral convexity subdural hematoma with no midline shift. No new acute intracranial pathology. Electronically Signed   By: Valetta Mole M.D.   On: 03/06/2022 09:44   CT HEAD WO CONTRAST (5MM)  Result Date: 03/03/2022 CLINICAL DATA:  Mental status change, subdural hematoma. 6 hour follow-up from yesterday. EXAM: CT HEAD WITHOUT CONTRAST TECHNIQUE: Contiguous axial images were obtained from the base of the skull through the vertex without intravenous contrast. RADIATION DOSE REDUCTION: This exam was performed according to the departmental dose-optimization program which includes automated exposure control, adjustment of the mA and/or kV according to patient size and/or use of iterative reconstruction technique. COMPARISON:  CT yesterday at 5:45 p.m. FINDINGS: Brain: Images of the current study were completed 12:06 a.m., 03/03/2022. Again noted is a 7 mm in thickness lateral left frontotemporal convexity subdural hematoma, and a 3 mm in thickness subdural bleed along the superior aspect of the left leaf of the tentorium. There has been no significant interval change. There is slight underlying  left frontotemporal gyral crowding and no more than 2 mm of left-to-right midline shift of the septum pellucidum. There is no downward mass effect. There is mild cerebellar atrophy and mild-to-moderate cerebral atrophy and atrophic ventriculomegaly with mild small-vessel disease in the cerebral white matter. Basal cisterns are clear. No cortical based infarct, parenchymal bleed or mass is seen. Vascular: There are calcifications in  the distal vertebral arteries and carotid siphons. No hyperdense central vessels. Skull: Negative for fractures or focal lesions. Sinuses/Orbits: No acute finding. No mastoid effusion or new sinus disease. Small retention cyst left sphenoid air cell. Other: None. IMPRESSION: 1. Stable 7 mm in thickness left frontotemporal convexity subdural hematoma and 3 mm in thickness left tentorial subdural bleed. No new or worsening hemorrhage. 2. 2 mm left-to-right midline shift of the septum pellucidum, no downward mass effect. 3. Atrophy and small-vessel disease. Electronically Signed   By: Telford Nab M.D.   On: 03/03/2022 00:20   CT HEAD WO CONTRAST (5MM)  Result Date: 03/02/2022 CLINICAL DATA:  [place] EXAM: CT HEAD WITHOUT CONTRAST TECHNIQUE: Contiguous axial images were obtained from the base of the skull through the vertex without intravenous contrast. RADIATION DOSE REDUCTION: This exam was performed according to the departmental dose-optimization program which includes automated exposure control, adjustment of the mA and/or kV according to patient size and/or use of iterative reconstruction technique. COMPARISON:  MRI head 04/26/2017. FINDINGS: Brain: Next density subdural hemorrhage along the left cerebral convexity, measuring up to 8 mm in thickness. Also, trace acute hemorrhage layering along the left tentorial leaflet. Trace resulting rightward midline shift. No evidence of acute large vascular territory infarct, mass lesion or hydrocephalus. Mild for age cerebral atrophy with ex vacuo ventricular dilation. Vascular: No hyperdense vessel identified. Skull: No acute fracture. Sinuses/Orbits: Largely clear sinuses.  No acute orbital findings. Other: No mastoid effusions. IMPRESSION: Mixed density subdural hemorrhage along the left cerebral convexity and extending along the left tentorial leaflet, measuring up to 8 mm in thickness. Intermixed hyperdense component is compatible with acute/recent hemorrhage.  Trace resulting rightward midline shift. Findings discussed with Dr. Ellender Hose via telephone at 6:22 p.m. Electronically Signed   By: Margaretha Sheffield M.D.   On: 03/02/2022 18:23   DG Chest 2 View  Result Date: 03/02/2022 CLINICAL DATA:  Weakness. EXAM: CHEST - 2 VIEW COMPARISON:  February 01, 2022. FINDINGS: Stable cardiomediastinal silhouette. Right internal jugular catheter is unchanged in position. Mild bilateral pleural effusions are noted with associated atelectasis which is increased compared to prior exam. Stable hiatal hernia. Bony thorax is unremarkable. IMPRESSION: Increased bilateral pleural effusions are noted with associated atelectasis. Electronically Signed   By: Marijo Conception M.D.   On: 03/02/2022 16:40   DG Abd 1 View  Result Date: 02/09/2022 CLINICAL DATA:  Nausea vomiting EXAM: ABDOMEN - 1 VIEW COMPARISON:  CT abdomen pelvis 02/01/2022 FINDINGS: The bowel gas pattern is normal. No radio-opaque calculi or other significant radiographic abnormality are seen. IMPRESSION: Negative. Electronically Signed   By: Franchot Gallo M.D.   On: 02/09/2022 16:20   US Paracentesis  Result Date: 02/08/2022 INDICATION: Patient with a history of cirrhosis and recurrent ascites. Interventional radiology asked to perform a therapeutic paracentesis. EXAM: ULTRASOUND GUIDED PARACENTESIS MEDICATIONS: 1% lidocaine 15 mL COMPLICATIONS: None immediate. PROCEDURE: Informed written consent was obtained from the patient after a discussion of the risks, benefits and alternatives to treatment. A timeout was performed prior to the initiation of the procedure. Initial ultrasound scanning demonstrates a large amount of ascites within  the right lower abdominal quadrant. The right lower abdomen was prepped and draped in the usual sterile fashion. 1% lidocaine was used for local anesthesia. Following this, a 6 Fr Safe-T-Centesis catheter was introduced. An ultrasound image was saved for documentation purposes. The  paracentesis was performed. The catheter was removed and a dressing was applied. The patient tolerated the procedure well without immediate post procedural complication. FINDINGS: A total of approximately 2.4 L of clear yellow fluid was removed. IMPRESSION: Successful ultrasound-guided paracentesis yielding 2.4 liters of peritoneal fluid. Read by: Soyla Dryer, NP PLAN: The patient has required >/=2 paracenteses in a 30 day period and a formal evaluation by the [place] [facility] has been arranged. Electronically Signed   By: Aletta Edouard M.D.   On: 02/08/2022 16:21    Microbiology: Results for orders placed or performed in visit on 03/06/22  Body fluid culture w Gram Stain     Status: None (Preliminary result)   Collection Time: 03/06/22  3:51 PM   Specimen: PATH Cytology Peritoneal fluid  Result Value Ref Range Status   Specimen Description   Final    PERITONEAL Performed at [facility], [address] [place], [place] [address]    Special Requests   Final    NONE Performed at [facility], [address], [place], [place] [address]    Gram Stain   Final    WBC PRESENT, PREDOMINANTLY MONONUCLEAR NO ORGANISMS SEEN CYTOSPIN SMEAR    Culture   Final    NO GROWTH < 24 HOURS Performed at [facility], [place] [address], [place], [place] [address]    Report Status PENDING  Incomplete    Labs: CBC: Recent Labs  Lab 03/02/22 1426 03/06/22 0350 03/06/22 0808 03/06/22 2244 03/07/22 0657  WBC 21.2* 14.3* 15.2*  --  17.5*  HGB 8.3* 7.5* 7.3* 9.3* 9.4*  HCT 25.2* 22.9* 22.6*  --  27.4*  MCV 100.8* 100.0 100.9*  --  96.5  PLT 124* 84* 83*  --  76*   Basic Metabolic Panel: Recent Labs  Lab 03/02/22 1426 03/06/22 0350 03/06/22 0808 03/07/22 0657  NA 138 132* 132* 130*  K 4.6 3.2* 3.3* 3.6  CL 103 96* 97* 97*  CO2 '25 26 27 25  '$ GLUCOSE 186* 134* 109* 120*  BUN '14 17 19 22  '$ CREATININE 2.65* 2.90* 3.13*  3.72*  CALCIUM 8.2* 8.5* 8.4* 8.6*   Liver Function Tests: Recent Labs  Lab 03/02/22 1426  AST 93*  ALT 40  ALKPHOS 120  BILITOT 2.0*  PROT 5.6*  ALBUMIN 2.2*   CBG: No results for input(s): "GLUCAP" in the last 168 hours.  Discharge time spent: greater than 30 minutes.  Signed: Sharen Hones, MD [facility] 03/07/2022

## 2022-03-08 ENCOUNTER — Telehealth: Payer: Self-pay | Admitting: Internal Medicine

## 2022-03-08 ENCOUNTER — Telehealth: Payer: Self-pay

## 2022-03-08 DIAGNOSIS — Z992 Dependence on renal dialysis: Secondary | ICD-10-CM | POA: Diagnosis not present

## 2022-03-08 DIAGNOSIS — K7460 Unspecified cirrhosis of liver: Secondary | ICD-10-CM | POA: Diagnosis not present

## 2022-03-08 DIAGNOSIS — K7682 Hepatic encephalopathy: Secondary | ICD-10-CM | POA: Diagnosis not present

## 2022-03-08 DIAGNOSIS — R5381 Other malaise: Secondary | ICD-10-CM | POA: Diagnosis not present

## 2022-03-08 DIAGNOSIS — D649 Anemia, unspecified: Secondary | ICD-10-CM | POA: Diagnosis not present

## 2022-03-08 DIAGNOSIS — N2581 Secondary hyperparathyroidism of renal origin: Secondary | ICD-10-CM | POA: Diagnosis not present

## 2022-03-08 DIAGNOSIS — I5032 Chronic diastolic (congestive) heart failure: Secondary | ICD-10-CM | POA: Diagnosis not present

## 2022-03-08 DIAGNOSIS — N189 Chronic kidney disease, unspecified: Secondary | ICD-10-CM | POA: Diagnosis not present

## 2022-03-08 DIAGNOSIS — Z041 Encounter for examination and observation following transport accident: Secondary | ICD-10-CM | POA: Diagnosis not present

## 2022-03-08 DIAGNOSIS — E785 Hyperlipidemia, unspecified: Secondary | ICD-10-CM | POA: Diagnosis not present

## 2022-03-08 DIAGNOSIS — N179 Acute kidney failure, unspecified: Secondary | ICD-10-CM | POA: Diagnosis not present

## 2022-03-08 DIAGNOSIS — R531 Weakness: Secondary | ICD-10-CM | POA: Diagnosis not present

## 2022-03-08 DIAGNOSIS — K219 Gastro-esophageal reflux disease without esophagitis: Secondary | ICD-10-CM | POA: Diagnosis not present

## 2022-03-08 DIAGNOSIS — D696 Thrombocytopenia, unspecified: Secondary | ICD-10-CM | POA: Diagnosis not present

## 2022-03-08 DIAGNOSIS — S0990XA Unspecified injury of head, initial encounter: Secondary | ICD-10-CM | POA: Diagnosis not present

## 2022-03-08 DIAGNOSIS — Z4901 Encounter for fitting and adjustment of extracorporeal dialysis catheter: Secondary | ICD-10-CM | POA: Diagnosis not present

## 2022-03-08 DIAGNOSIS — J449 Chronic obstructive pulmonary disease, unspecified: Secondary | ICD-10-CM | POA: Diagnosis not present

## 2022-03-08 DIAGNOSIS — I482 Chronic atrial fibrillation, unspecified: Secondary | ICD-10-CM | POA: Diagnosis not present

## 2022-03-08 DIAGNOSIS — R188 Other ascites: Secondary | ICD-10-CM | POA: Diagnosis not present

## 2022-03-08 DIAGNOSIS — Z23 Encounter for immunization: Secondary | ICD-10-CM | POA: Diagnosis not present

## 2022-03-08 DIAGNOSIS — E1122 Type 2 diabetes mellitus with diabetic chronic kidney disease: Secondary | ICD-10-CM | POA: Diagnosis not present

## 2022-03-08 DIAGNOSIS — N250 Renal osteodystrophy: Secondary | ICD-10-CM | POA: Diagnosis not present

## 2022-03-08 DIAGNOSIS — Z9989 Dependence on other enabling machines and devices: Secondary | ICD-10-CM | POA: Diagnosis not present

## 2022-03-08 DIAGNOSIS — R2689 Other abnormalities of gait and mobility: Secondary | ICD-10-CM | POA: Diagnosis not present

## 2022-03-08 DIAGNOSIS — W050XXA Fall from non-moving wheelchair, initial encounter: Secondary | ICD-10-CM | POA: Diagnosis not present

## 2022-03-08 DIAGNOSIS — M6281 Muscle weakness (generalized): Secondary | ICD-10-CM | POA: Diagnosis not present

## 2022-03-08 DIAGNOSIS — I6200 Nontraumatic subdural hemorrhage, unspecified: Secondary | ICD-10-CM | POA: Diagnosis not present

## 2022-03-08 DIAGNOSIS — I2510 Atherosclerotic heart disease of native coronary artery without angina pectoris: Secondary | ICD-10-CM | POA: Diagnosis not present

## 2022-03-08 DIAGNOSIS — I10 Essential (primary) hypertension: Secondary | ICD-10-CM | POA: Diagnosis not present

## 2022-03-08 DIAGNOSIS — I480 Paroxysmal atrial fibrillation: Secondary | ICD-10-CM | POA: Diagnosis not present

## 2022-03-08 DIAGNOSIS — D509 Iron deficiency anemia, unspecified: Secondary | ICD-10-CM | POA: Diagnosis not present

## 2022-03-08 DIAGNOSIS — I132 Hypertensive heart and chronic kidney disease with heart failure and with stage 5 chronic kidney disease, or end stage renal disease: Secondary | ICD-10-CM | POA: Diagnosis not present

## 2022-03-08 DIAGNOSIS — R2681 Unsteadiness on feet: Secondary | ICD-10-CM | POA: Diagnosis not present

## 2022-03-08 DIAGNOSIS — S065XAA Traumatic subdural hemorrhage with loss of consciousness status unknown, initial encounter: Secondary | ICD-10-CM

## 2022-03-08 DIAGNOSIS — S0101XA Laceration without foreign body of scalp, initial encounter: Secondary | ICD-10-CM | POA: Diagnosis not present

## 2022-03-08 DIAGNOSIS — Z515 Encounter for palliative care: Secondary | ICD-10-CM | POA: Diagnosis not present

## 2022-03-08 DIAGNOSIS — G4733 Obstructive sleep apnea (adult) (pediatric): Secondary | ICD-10-CM | POA: Diagnosis not present

## 2022-03-08 DIAGNOSIS — D631 Anemia in chronic kidney disease: Secondary | ICD-10-CM | POA: Diagnosis not present

## 2022-03-08 DIAGNOSIS — N186 End stage renal disease: Secondary | ICD-10-CM | POA: Diagnosis not present

## 2022-03-08 DIAGNOSIS — I5022 Chronic systolic (congestive) heart failure: Secondary | ICD-10-CM | POA: Diagnosis not present

## 2022-03-08 LAB — HEMOGLOBIN: Hemoglobin: 9.5 g/dL — ABNORMAL LOW (ref 12.0–15.0)

## 2022-03-08 MED ORDER — ORAL CARE MOUTH RINSE: 15.0000 mL | OROMUCOSAL | Status: DC | PRN

## 2022-03-08 NOTE — Telephone Encounter (Signed)
-----   Message from April [name] sent at 03/08/2022  8:30 AM EST ----- Regarding: CT order Contact: [phone] Meade Maw, MD  April [name] 03/03/2022 consult stable subacute subdural hematoma over the left frontal and temporal lobes Her family has not decided if they are going to follow up.  If they do, we can do repeat head CT and phone call by me in 3-4 weeks.   April [name] pt's son has decided for patient to follow-up. Can you please place order the CT .

## 2022-03-08 NOTE — Telephone Encounter (Signed)
Left message for April [name] to call the office.

## 2022-03-08 NOTE — Discharge Summary (Addendum)
Physician Discharge Summary   Patient: HAILY CALEY MRN: [id number] DOB: 1941-01-16  Admit date:     03/02/2022  Discharge date: 03/08/22  Discharge Physician: Dr Roosevelt Locks on 03/07/22. Dr Lupita Leash 03/08/22   PCP: Einar Pheasant, MD   Recommendations at discharge:   Follow-up with PCP in 1 week. Continue outpatient dialysis. Continue scheduled paracentesis. Follow-up with neurosurgery in 2 weeks. BMP and magnesium weekly.    Discharge Diagnoses: Principal Problem:   Subdural hemorrhage ([place]) Active Problems:   Upper GI bleed   Acute hepatic encephalopathy (HCC)   Symptomatic anemia   Chronic diastolic CHF (congestive heart failure) (HCC)   Cirrhosis of liver with ascites (HCC)   HTN (hypertension)   Paroxysmal atrial fibrillation (HCC)   Chronic obstructive pulmonary disease, unspecified COPD type (HCC)   Hypokalemia   Hyponatremia   Thrombocytopenia (HCC)   Prolonged QT interval   End-stage renal disease on hemodialysis ([place])   GERD without esophagitis  Resolved Problems:   * No resolved hospital problems. *  Hospital Course: 81 year old female with past medical history of ESRD on HD TTS with right chest permacath, anemia of CKD/secondary hyperparathyroidism, CAD s/p angioplasty, diabetes mellitus, PAF on Eliquis, sleep apnea, diastolic heart failure and [name] cirrhosis who has been noncompliant with her lactulose and presented to the ED 11/2 with confusion In the ED imaging showed left-sided subdural hematoma with small left to right midline shift.  Repeat scan on the early morning of 11/3 noted no change.  Patient was admitted to the hospitalist service and neurosurgery consulted.  Neurosurgery felt given patient's major medical comorbidities, not candidate for surgical intervention.  It was also felt that subdural hematoma was slightly subacute and predated change in mental status.  Patient noted to have elevated ammonia levels , GI consulted. 11/6 Hemoglobin dropped down to 7.3,  started Protonix and 1 unit prbc 11/8:Overnight BP had been soft in 94T systolic used CPAP last night Patient has been stabilized and dischargeD pending skilled nursing facility. Patient received 1 unit PRBC, hemoglobin increased to 9.4, initial drop of hemoglobin appears to be lab error.  No suspicion for any GI bleed at this time.  Assessment and Plan: Subdural hemorrhage ([place] Fall at home secondary to altered mental status. Hepatic encephalopathy with altered mental status. Patient had a fall at home resulting in subdural hematoma.  Has been seen by neurosurgery, no surgical interventions required. Repeated CT scan head today showed stable subdural hematoma, no midline shift.  This is the improvement. Mental status has improved. Patient has initially had an increased procalcitonin level, but no other source of infection.  Repeated procalcitonin level had dropped down.   Acute on chronic anemia. Thrombocytopenia. Chronic anemia, hemoglobin was running between 7.5-8.5.  Sudden drop of hemoglobin appears to be a lab error.   Liver cirrhosis with ascites. Patient does not have any abdominal pain.  Paracentesis was performed on 11/6, removed 4.4 L.  Continue scheduled paracentesis as outpatient.   GERD without esophagitis PPI.   End-stage renal disease on hemodialysis (HCC) Hypokalemia Hyponatremia. Followed by nephrology for dialysis, TTS.  Potassium normalized today.   Paroxysmal atrial fibrillation (HCC) On amiodarone.  Appears in sinus.  This is a discharge summary from 03/07/22 since patient did not leave and facility requesting New discharge summary for 03/08/22 No changes needed       Consultants: Nephrology Procedures performed: HD, paracentesis. Disposition: Skilled nursing facility Diet recommendation:  Discharge Diet Orders (From admission, onward)     Start  Ordered   03/07/22 0000  Diet general       Comments: Dys 3, renal diet   03/07/22 0932            Renal diet DISCHARGE MEDICATION: Allergies as of 03/08/2022       Reactions   Penicillins Other (See Comments)   Okay to take amoxicillin/(pt does not recall what the reaction to penicillin was (30-82 years old)   Sulfa Antibiotics Rash        Medication List     STOP taking these medications    docusate sodium 100 MG capsule Commonly known as: Stool Softener   ipratropium-albuterol 0.5-2.5 (3) MG/3ML Soln Commonly known as: DUONEB       TAKE these medications    acetaminophen 325 MG tablet Commonly known as: TYLENOL Take 650 mg by mouth every 4 (four) hours as needed for mild pain or fever.   amiodarone 200 MG tablet Commonly known as: PACERONE Take 1 tablet (200 mg total) by mouth daily.   epoetin alfa 10000 UNIT/ML injection Commonly known as: EPOGEN Inject 0.8 mLs (8,000 Units total) into the vein Every Tuesday,Thursday,and Saturday with dialysis.   hydrocortisone 25 MG suppository Commonly known as: ANUSOL-HC Place 25 mg rectally 2 (two) times daily.   lactulose 10 GM/15ML solution Commonly known as: CHRONULAC Take 45 mLs (30 g total) by mouth 3 (three) times daily.   midodrine 10 MG tablet Commonly known as: PROAMATINE Take 1 tablet (10 mg total) by mouth 3 (three) times daily with meals.   multivitamin with minerals tablet Take 1 tablet by mouth daily.   nitroGLYCERIN 0.4 MG SL tablet Commonly known as: Nitrostat Place 1 tablet (0.4 mg total) under the tongue every 5 (five) minutes as needed for chest pain.   pantoprazole 40 MG tablet Commonly known as: PROTONIX Take 1 tablet (40 mg total) by mouth 2 (two) times daily before a meal.   rifaximin 550 MG Tabs tablet Commonly known as: XIFAXAN Take 1 tablet (550 mg total) by mouth 2 (two) times daily.   simethicone 80 MG chewable tablet Commonly known as: MYLICON Chew 1 tablet (80 mg total) by mouth 4 (four) times daily as needed for flatulence.   Torsemide 40 MG Tabs Take 80 mg by mouth  daily.        Follow-up Information     Einar Pheasant, MD. Go in 1 week(s).   Specialty: Internal Medicine Why: Appointment on Monday, 03/13/2022 at 11:00am Contact information: [address] [place] [address] [phone]                Discharge Exam: Danley Danker Weights   03/08/22 0500  Weight: 75.8 kg   General exam: Appears calm and comfortable  Respiratory system: Clear to auscultation. Respiratory effort normal. Cardiovascular system: S1 & S2 heard, RRR. No JVD, murmurs, rubs, gallops or clicks. No pedal edema. Gastrointestinal system: Abdomen is nondistended, soft and nontender. No organomegaly or masses felt. Normal bowel sounds heard. Central nervous system: Alert and oriented. No focal neurological deficits. Extremities: Symmetric 5 x 5 power. Skin: No rashes, lesions or ulcers Psychiatry: Judgement and insight appear normal. Mood & affect appropriate.    Condition at discharge: good  The results of significant diagnostics from this hospitalization (including imaging, microbiology, ancillary and laboratory) are listed below for reference.   Imaging Studies: US Paracentesis  Result Date: 03/06/2022 INDICATION: Patient with history of cirrhosis and recurrent ascites. Request received to perform therapeutic paracentesis. EXAM: ULTRASOUND GUIDED  THERAPEUTIC RIGHT LOWER QUADRANT PARACENTESIS MEDICATIONS: 10 mL 1 % lidocaine COMPLICATIONS: None immediate. PROCEDURE: Informed written consent was obtained from the patient after a discussion of the risks, benefits and alternatives to treatment. A timeout was performed prior to the initiation of the procedure. Initial ultrasound scanning demonstrates a large amount of ascites within the right lower abdominal quadrant. The right lower abdomen was prepped and draped in the usual sterile fashion. 1% lidocaine was used for local anesthesia. Following this, a 19 gauge, 7-cm, Yueh catheter was  introduced. An ultrasound image was saved for documentation purposes. The paracentesis was performed. The catheter was removed and a dressing was applied. The patient tolerated the procedure well without immediate post procedural complication. FINDINGS: A total of approximately 4.4 L of clear, yellow fluid was removed. IMPRESSION: Successful ultrasound-guided paracentesis yielding 4.4 liters of peritoneal fluid. Read by: Narda Rutherford, AGNP-BC PLAN: The patient has previously been evaluated by the [facility], and deemed not a candidate for intervention. Michaelle Birks, MD [facility] Radiology Electronically Signed   By: Michaelle Birks M.D.   On: 03/06/2022 16:45   CT HEAD WO CONTRAST (5MM)  Result Date: 03/06/2022 CLINICAL DATA:  Subdural hematoma EXAM: CT HEAD WITHOUT CONTRAST TECHNIQUE: Contiguous axial images were obtained from the base of the skull through the vertex without intravenous contrast. RADIATION DOSE REDUCTION: This exam was performed according to the departmental dose-optimization program which includes automated exposure control, adjustment of the mA and/or kV according to patient size and/or use of iterative reconstruction technique. COMPARISON:  CT head 03/03/2022 FINDINGS: Brain: The left cerebral convexity subdural hematoma measuring up to proximally 8 mm in maximal thickness in the coronal plane is not significantly changed in size when remeasured using similar technique and is overall decreased in density, consistent with evolving blood products. The component of blood along the left tentorial leaflet is decreased in conspicuity. There is no new hyperdense blood within the collection to suggest acute/interval bleed. There is minimal mass effect on the underlying brain parenchyma with no midline shift. There is no new acute intracranial hemorrhage or extra-axial fluid collection. There is no acute  territorial infarct. The ventricles are stable in size.  There is no solid mass lesion. Vascular: There is calcification of the bilateral carotid siphons and vertebral arteries. Skull: Normal. Negative for fracture or focal lesion. Sinuses/Orbits: The imaged paranasal sinuses are clear. Bilateral lens implants are in place. The globes and orbits are otherwise unremarkable. Other: The calcified left parieto-occipital scalp lesion is unchanged. IMPRESSION: Expected evolution of 8 mm maximally thick left cerebral convexity subdural hematoma with no midline shift. No new acute intracranial pathology. Electronically Signed   By: Valetta Mole M.D.   On: 03/06/2022 09:44   CT HEAD WO CONTRAST (5MM)  Result Date: 03/03/2022 CLINICAL DATA:  Mental status change, subdural hematoma. 6 hour follow-up from yesterday. EXAM: CT HEAD WITHOUT CONTRAST TECHNIQUE: Contiguous axial images were obtained from the base of the skull through the vertex without intravenous contrast. RADIATION DOSE REDUCTION: This exam was performed according to the departmental dose-optimization program which includes automated exposure control, adjustment of the mA and/or kV according to patient size and/or use of iterative reconstruction technique. COMPARISON:  CT yesterday at 5:45 p.m. FINDINGS: Brain: Images of the current study were completed 12:06 a.m., 03/03/2022. Again noted is a 7 mm in thickness lateral left frontotemporal convexity subdural hematoma, and a 3 mm in thickness subdural bleed along the superior aspect  of the left leaf of the tentorium. There has been no significant interval change. There is slight underlying left frontotemporal gyral crowding and no more than 2 mm of left-to-right midline shift of the septum pellucidum. There is no downward mass effect. There is mild cerebellar atrophy and mild-to-moderate cerebral atrophy and atrophic ventriculomegaly with mild small-vessel disease in the cerebral white matter. Basal cisterns are  clear. No cortical based infarct, parenchymal bleed or mass is seen. Vascular: There are calcifications in the distal vertebral arteries and carotid siphons. No hyperdense central vessels. Skull: Negative for fractures or focal lesions. Sinuses/Orbits: No acute finding. No mastoid effusion or new sinus disease. Small retention cyst left sphenoid air cell. Other: None. IMPRESSION: 1. Stable 7 mm in thickness left frontotemporal convexity subdural hematoma and 3 mm in thickness left tentorial subdural bleed. No new or worsening hemorrhage. 2. 2 mm left-to-right midline shift of the septum pellucidum, no downward mass effect. 3. Atrophy and small-vessel disease. Electronically Signed   By: Telford Nab M.D.   On: 03/03/2022 00:20   CT HEAD WO CONTRAST (5MM)  Result Date: 03/02/2022 CLINICAL DATA:  Delirium EXAM: CT HEAD WITHOUT CONTRAST TECHNIQUE: Contiguous axial images were obtained from the base of the skull through the vertex without intravenous contrast. RADIATION DOSE REDUCTION: This exam was performed according to the departmental dose-optimization program which includes automated exposure control, adjustment of the mA and/or kV according to patient size and/or use of iterative reconstruction technique. COMPARISON:  MRI head 04/26/2017. FINDINGS: Brain: Next density subdural hemorrhage along the left cerebral convexity, measuring up to 8 mm in thickness. Also, trace acute hemorrhage layering along the left tentorial leaflet. Trace resulting rightward midline shift. No evidence of acute large vascular territory infarct, mass lesion or hydrocephalus. Mild for age cerebral atrophy with ex vacuo ventricular dilation. Vascular: No hyperdense vessel identified. Skull: No acute fracture. Sinuses/Orbits: Largely clear sinuses.  No acute orbital findings. Other: No mastoid effusions. IMPRESSION: Mixed density subdural hemorrhage along the left cerebral convexity and extending along the left tentorial leaflet,  measuring up to 8 mm in thickness. Intermixed hyperdense component is compatible with acute/recent hemorrhage. Trace resulting rightward midline shift. Findings discussed with Dr. Ellender Hose via telephone at 6:22 p.m. Electronically Signed   By: Margaretha Sheffield M.D.   On: 03/02/2022 18:23   DG Chest 2 View  Result Date: 03/02/2022 CLINICAL DATA:  Weakness. EXAM: CHEST - 2 VIEW COMPARISON:  February 01, 2022. FINDINGS: Stable cardiomediastinal silhouette. Right internal jugular catheter is unchanged in position. Mild bilateral pleural effusions are noted with associated atelectasis which is increased compared to prior exam. Stable hiatal hernia. Bony thorax is unremarkable. IMPRESSION: Increased bilateral pleural effusions are noted with associated atelectasis. Electronically Signed   By: Marijo Conception M.D.   On: 03/02/2022 16:40   DG Abd 1 View  Result Date: 02/09/2022 CLINICAL DATA:  Nausea vomiting EXAM: ABDOMEN - 1 VIEW COMPARISON:  CT abdomen pelvis 02/01/2022 FINDINGS: The bowel gas pattern is normal. No radio-opaque calculi or other significant radiographic abnormality are seen. IMPRESSION: Negative. Electronically Signed   By: Franchot Gallo M.D.   On: 02/09/2022 16:20   US Paracentesis  Result Date: 02/08/2022 INDICATION: Patient with a history of cirrhosis and recurrent ascites. Interventional radiology asked to perform a therapeutic paracentesis. EXAM: ULTRASOUND GUIDED PARACENTESIS MEDICATIONS: 1% lidocaine 15 mL COMPLICATIONS: None immediate. PROCEDURE: Informed written consent was obtained from the patient after a discussion of the risks, benefits and alternatives to treatment. A timeout was  performed prior to the initiation of the procedure. Initial ultrasound scanning demonstrates a large amount of ascites within the right lower abdominal quadrant. The right lower abdomen was prepped and draped in the usual sterile fashion. 1% lidocaine was used for local anesthesia. Following this, a 6 Fr  Safe-T-Centesis catheter was introduced. An ultrasound image was saved for documentation purposes. The paracentesis was performed. The catheter was removed and a dressing was applied. The patient tolerated the procedure well without immediate post procedural complication. FINDINGS: A total of approximately 2.4 L of clear yellow fluid was removed. IMPRESSION: Successful ultrasound-guided paracentesis yielding 2.4 liters of peritoneal fluid. Read by: Soyla Dryer, NP PLAN: The patient has required >/=2 paracenteses in a 30 day period and a formal evaluation by the [place] has been arranged. Electronically Signed   By: Aletta Edouard M.D.   On: 02/08/2022 16:21    Microbiology: Results for orders placed or performed in visit on 03/06/22  Body fluid culture w Gram Stain     Status: None (Preliminary result)   Collection Time: 03/06/22  3:51 PM   Specimen: PATH Cytology Peritoneal fluid  Result Value Ref Range Status   Specimen Description   Final    PERITONEAL Performed at [facility], [address], [place], [place] [address]    Special Requests   Final    NONE Performed at [facility], [address], [place], [place] [address]    Gram Stain   Final    WBC PRESENT, PREDOMINANTLY MONONUCLEAR NO ORGANISMS SEEN CYTOSPIN SMEAR    Culture   Final    NO GROWTH 2 DAYS Performed at [facility], [place] [address] [place], [place] [address]    Report Status PENDING  Incomplete    Labs: CBC: Recent Labs  Lab 03/02/22 1426 03/06/22 0350 03/06/22 0808 03/06/22 2244 03/07/22 0657 03/07/22 1357 03/07/22 2244 03/08/22 0553  WBC 21.2* 14.3* 15.2*  --  17.5*  --   --   --   HGB 8.3* 7.5* 7.3* 9.3* 9.4* 9.6* 10.4* 9.5*  HCT 25.2* 22.9* 22.6*  --  27.4*  --   --   --   MCV 100.8* 100.0 100.9*  --  96.5  --   --   --   PLT 124* 84* 83*  --  76*  --   --   --     Basic Metabolic Panel: Recent Labs  Lab  03/02/22 1426 03/06/22 0350 03/06/22 0808 03/07/22 0657  NA 138 132* 132* 130*  K 4.6 3.2* 3.3* 3.6  CL 103 96* 97* 97*  CO2 '25 26 27 25  '$ GLUCOSE 186* 134* 109* 120*  BUN '14 17 19 22  '$ CREATININE 2.65* 2.90* 3.13* 3.72*  CALCIUM 8.2* 8.5* 8.4* 8.6*    Liver Function Tests: Recent Labs  Lab 03/02/22 1426  AST 93*  ALT 40  ALKPHOS 120  BILITOT 2.0*  PROT 5.6*  ALBUMIN 2.2*    CBG: Recent Labs  Lab 03/07/22 2140  GLUCAP 160*    Discharge time spent: greater than 30 minutes.  Signed: Sharen Hones, MD on 03/07/22 [facility] 03/07/2022  Antonieta Pert, MD [facility] 03/08/2022

## 2022-03-08 NOTE — Progress Notes (Signed)
Pt A&Ox4. Morning medications given w/o complications. Pt discharge to SNF with son providing transportation. BP (!) 98/55 (BP Location: Right Arm)   Pulse 87   Temp 98.4 F (36.9 C) (Oral)   Resp 18   Wt 75.8 kg   LMP 05/01/1972   SpO2 98%   BMI 32.61 kg/m  Med req reviewed with son by Jackelyn Hoehn. Penne Rosenstock/Yan. No follow up questions by Son. Pt had no c/o pain. No sign/symptoms of bleeding. All belongings returned to son and patient. Patient escorted off floor by Nurse with assistance to Lucianne Lei of son that is wheel chair capable. IV removed and tele removed.  Report called to SNF Louanne Skye 03/08/22 11:56 AM

## 2022-03-08 NOTE — Telephone Encounter (Signed)
Order has been placed and I have sent a message to [place] scheduling.

## 2022-03-08 NOTE — Progress Notes (Signed)
Patient was discharged yesterday but was in dialysis till evening so did not leave as it was late. I saw/examined her-she is alert awake having her breakfast no complaints Still feels ready to go to the facility today Last vitals: Blood pressure (!) 98/55, pulse 87, temperature 98.4 F (36.9 C), temperature source Oral, resp. rate 18, weight 75.8 kg, last menstrual period 05/01/1972, SpO2 98 %.  Hemoglobin 9.5 g.  Discharge date 03/08/2022 Discharge summary and medication reviewed no changes needed  Patient to discharge back to facility today

## 2022-03-08 NOTE — Plan of Care (Signed)
  Problem: Intracerebral Hemorrhage Tissue Perfusion: Goal: Complications of Intracerebral Hemorrhage will be minimized Outcome: Progressing   Problem: Coping: Goal: Will verbalize positive feelings about self Outcome: Progressing

## 2022-03-08 NOTE — Telephone Encounter (Signed)
April [name] called to inform that the facility does not offer transportation on Wednesday afternoon. This patient is scheduled for Lab, MD and possible iron on Wednesday 11/15. Then possible blood on Friday. The facility can not accommodate the transportation for her to come on Wednesday. I have looked and tried to get her on for Wednesday morning, no chairs- her dialysis isn't over until 3 so I can get her in on Tuesday afternoon. I am stuck. Dont know what else to do to get her in.   This is a Dr.B patient that I needed to get moved anyway.   Should I message Dr. Grayland Ormond and see what he suggest?   Please help!!   Patient has dialysis on Tuesday and Thursday.

## 2022-03-08 NOTE — TOC Transition Note (Signed)
Transition of Care [facility]) - CM/SW Discharge Note   Patient Details  Name: April [name] MRN: [id number] Date of Birth: 08-08-1940  [facility] CM/SW Contact:  Candie Chroman, LCSW Phone Number: 03/08/2022, 10:45 AM   Clinical Narrative:  Discharge delayed due to getting back from HD late. SNF is ready for patient today. MD completed updated discharge summary. No further concerns. CSW signing off.   Final next level of care: Skilled Nursing Facility Barriers to Discharge: Barriers Resolved   Patient Goals and CMS Choice   CMS Medicare.gov Compare Post Acute Care list provided to:: Other (Comment Required) (Son [name]) Choice offered to / list presented to : Patient, Adult Children  Discharge Placement   Existing PASRR number confirmed : 03/05/22          Patient chooses bed at: [facility] Patient to be transferred to facility by: Son Name of family member notified: Alecia Doi Patient and family notified of of transfer: 03/07/22  Discharge Plan and Services                DME Arranged: N/A DME Agency: NA       HH Arranged: NA [place] Agency: NA        Social Determinants of Health (SDOH) Interventions     Readmission Risk Interventions    02/10/2022   11:11 AM 11/28/2021   11:17 AM 10/19/2021   12:27 PM  Readmission Risk Prevention Plan  Transportation Screening Complete Complete Complete  PCP or Specialist Appt within 3-5 Days  Complete Complete  HRI or [place]  Complete Complete  Social Work Consult for [place] Planning/Counseling  Complete Complete  Palliative Care Screening  Not Applicable Complete  Medication Review Press photographer) Complete Complete Complete  PCP or Specialist appointment within 3-5 days of discharge Complete    HRI or [place] Complete    SW Recovery Care/Counseling Consult Complete    [place] Not Applicable

## 2022-03-08 NOTE — Progress Notes (Signed)
Physical Therapy Treatment Patient Details Name: April [name] MRN: [id number] DOB: 12-02-1940 Today's Date: 03/08/2022   History of Present Illness Pt is an 81 year old female presenting to the ED with confusion, during work up pt found to have left-sided subdural hematoma with small left to right midline shift. Noted that subdural hematoma was slightly subacute and predated change in mental status.  Patient noted to have elevated ammonia levels which were more likely felt to be because of mentation and GI consulted  PMH significant for end-stage renal disease, CAD status post angioplasty, diabetes mellitus, paroxysmal atrial fibrillation on Eliquis, sleep apnea, diastolic heart failure and Nash cirrhosis    PT Comments    Patient was agreeable to PT session. She continues to require assistance with bed mobility. Activity tolerance limited by fatigue and generalized weakness. She declined standing attempts due to fatigue. Recommend to continue PT to maximize independence and decrease caregiver burden. SNF is recommended at discharge.    Recommendations for follow up therapy are one component of a multi-disciplinary discharge planning process, led by the attending physician.  Recommendations may be updated based on patient status, additional functional criteria and insurance authorization.  Follow Up Recommendations  Skilled nursing-short term rehab (<3 hours/day) Can patient physically be transported by private vehicle: No   Assistance Recommended at Discharge Frequent or constant Supervision/Assistance  Patient can return home with the following A lot of help with bathing/dressing/bathroom;Assist for transportation;Two people to help with walking and/or transfers;Assistance with cooking/housework;Help with stairs or ramp for entrance;Direct supervision/assist for financial management   Equipment Recommendations   (to be determined at next level of care)    Recommendations for Other  Services       Precautions / Restrictions Precautions Precautions: Fall Restrictions Weight Bearing Restrictions: No     Mobility  Bed Mobility Overal bed mobility: Needs Assistance Bed Mobility: Supine to Sit, Sit to Supine, Rolling Rolling: Min assist   Supine to sit: Max assist Sit to supine: Max assist   General bed mobility comments: assistance required for trunk and BLE support with cues for sequencing. increased time and effort required. rolling assistance provided for peri-care as patient had been incontinent of bowels. maximal assistance for hygiene    Transfers                   General transfer comment: not attempted due to limited activity tolerance and fatigue with minimal activity    Ambulation/Gait                   Stairs             Wheelchair Mobility    Modified Rankin (Stroke Patients Only)       Balance Overall balance assessment: Needs assistance Sitting-balance support: Single extremity supported Sitting balance-Leahy Scale: Fair                                      Cognition Arousal/Alertness: Awake/alert Behavior During Therapy: WFL for tasks assessed/performed Overall Cognitive Status: Impaired/Different from baseline                         Following Commands: Follows one step commands inconsistently     Problem Solving: Requires verbal cues, Slow processing          Exercises      General Comments General comments (skin integrity,  edema, etc.): patient's own shirt donned per her request.      Pertinent Vitals/Pain Pain Assessment Pain Assessment: No/denies pain    Home Living Family/patient expects to be discharged to:: Skilled nursing facility Living Arrangements: Other (Comment) (SNF)                      Prior Function            PT Goals (current goals can now be found in the care plan section) Acute Rehab PT Goals Patient Stated Goal: get better PT  Goal Formulation: With patient Time For Goal Achievement: Apr 04, 2022 Potential to Achieve Goals: Fair Progress towards PT goals: Progressing toward goals    Frequency    Min 2X/week      PT Plan Current plan remains appropriate    Co-evaluation              AM-PAC PT "6 Clicks" Mobility   Outcome Measure  Help needed turning from your back to your side while in a flat bed without using bedrails?: A Lot Help needed moving from lying on your back to sitting on the side of a flat bed without using bedrails?: A Lot Help needed moving to and from a bed to a chair (including a wheelchair)?: A Lot Help needed standing up from a chair using your arms (e.g., wheelchair or bedside chair)?: A Lot Help needed to walk in hospital room?: A Lot Help needed climbing 3-5 steps with a railing? : Total 6 Click Score: 11    End of Session   Activity Tolerance: Patient limited by fatigue Patient left: in bed;with call bell/phone within reach (nursing student present throughout session) Nurse Communication: Mobility status PT Visit Diagnosis: Muscle weakness (generalized) (M62.81);Unsteadiness on feet (R26.81);Difficulty in walking, not elsewhere classified (R26.2)     Time: 1638-4665 PT Time Calculation (min) (ACUTE ONLY): 24 min  Charges:  $Therapeutic Activity: 23-37 mins                    Minna Merritts, PT, MPT    Percell Locus 03/08/2022, 11:46 AM

## 2022-03-08 NOTE — Progress Notes (Addendum)
[facility]  ROUNDING NOTE   Subjective:   April [name] is a 81 year old female with past medical history including liver cirrhosis, anemia, diverticulitis, hypertension, CHF, CAD, and end stage renal disease on hemodialysis. She presents to the emergency department with confusion. She has been admitted for Subdural hemorrhage (HCC) [I62.00] Hyperammonemia ([place]) [E72.20] Encephalopathy [G93.40] Acute on chronic intracranial subdural hematoma (HCC) [I62.01, I62.03]  This patient is known to our clinic and received outpatient dialysis treatments at [facility] on a TTS schedule, supervised by Dr. Candiss Norse.    Patient seen sitting up in bed, eating breakfast Alert and oriented Lower extremity edema remains Remains on room air  Objective:  Vital signs in last 24 hours:  Temp:  [97.8 F (36.6 C)-99 F (37.2 C)] 98.4 F (36.9 C) (11/08 0744) Pulse Rate:  [79-102] 87 (11/08 0744) Resp:  [16-33] 18 (11/08 0744) BP: (84-127)/(37-59) 98/55 (11/08 0744) SpO2:  [92 %-99 %] 98 % (11/08 0744) Weight:  [75.8 kg] 75.8 kg (11/08 0500)  Weight change:  Filed Weights   03/08/22 0500  Weight: 75.8 kg     Intake/Output: I/O last 3 completed shifts: In: 1802.8 [P.O.:240; I.V.:118; Blood:1031.2; IV Piggyback:413.6] Out: 100 [Other:100]   Intake/Output this shift:  No intake/output data recorded.  Physical Exam: General: NAD, resting comfortably  Head: Normocephalic, atraumatic. Moist oral mucosal membranes  Eyes: Anicteric  Lungs:  Clear to auscultation, normal effort, room air  Heart: Irregular rate and rhythm, no murmurs  Abdomen:  Soft, nontender  Extremities:  1+  peripheral edema  Neurologic: Alert, oriented to self  Skin: No lesions  Access: Rt chest permcath    Basic Metabolic Panel: Recent Labs  Lab 03/02/22 1426 03/06/22 0350 03/06/22 0808 03/07/22 0657  NA 138 132* 132* 130*  K 4.6 3.2* 3.3* 3.6  CL 103 96* 97* 97*  CO2 '25 26 27 25  '$ GLUCOSE 186*  134* 109* 120*  BUN '14 17 19 22  '$ CREATININE 2.65* 2.90* 3.13* 3.72*  CALCIUM 8.2* 8.5* 8.4* 8.6*     Liver Function Tests: Recent Labs  Lab 03/02/22 1426  AST 93*  ALT 40  ALKPHOS 120  BILITOT 2.0*  PROT 5.6*  ALBUMIN 2.2*    No results for input(s): "LIPASE", "AMYLASE" in the last 168 hours. Recent Labs  Lab 03/03/22 0543 03/04/22 0642 03/05/22 0552  AMMONIA 63* 44* 37*      CBC: Recent Labs  Lab 03/02/22 1426 03/06/22 0350 03/06/22 0808 03/06/22 2244 03/07/22 0657 03/07/22 1357 03/07/22 2244 03/08/22 0553  WBC 21.2* 14.3* 15.2*  --  17.5*  --   --   --   HGB 8.3* 7.5* 7.3* 9.3* 9.4* 9.6* 10.4* 9.5*  HCT 25.2* 22.9* 22.6*  --  27.4*  --   --   --   MCV 100.8* 100.0 100.9*  --  96.5  --   --   --   PLT 124* 84* 83*  --  76*  --   --   --      Cardiac Enzymes: No results for input(s): "CKTOTAL", "CKMB", "CKMBINDEX", "TROPONINI" in the last 168 hours.  BNP: Invalid input(s): "POCBNP"  CBG: Recent Labs  Lab 03/07/22 2140  GLUCAP 160*    Microbiology: Results for orders placed or performed in visit on 03/06/22  Body fluid culture w Gram Stain     Status: None (Preliminary result)   Collection Time: 03/06/22  3:51 PM   Specimen: PATH Cytology Peritoneal fluid  Result Value  Ref Range Status   Specimen Description   Final    PERITONEAL Performed at [facility], [address] [place], [place] [address]    Special Requests   Final    NONE Performed at [facility], [address] [place], [place] [address]    Gram Stain   Final    WBC PRESENT, PREDOMINANTLY MONONUCLEAR NO ORGANISMS SEEN CYTOSPIN SMEAR    Culture   Final    NO GROWTH 2 DAYS Performed at [facility], [place] [address], [place],  [address]    Report Status PENDING  Incomplete    Coagulation Studies: No results for input(s): "LABPROT", "INR" in the last 72 hours.    Urinalysis: No results for input(s): "COLORURINE", "LABSPEC",  "PHURINE", "GLUCOSEU", "HGBUR", "BILIRUBINUR", "KETONESUR", "PROTEINUR", "UROBILINOGEN", "NITRITE", "LEUKOCYTESUR" in the last 72 hours.  Invalid input(s): "APPERANCEUR"     Imaging: US Paracentesis  Result Date: 03/06/2022 INDICATION: Patient with history of cirrhosis and recurrent ascites. Request received to perform therapeutic paracentesis. EXAM: ULTRASOUND GUIDED THERAPEUTIC RIGHT LOWER QUADRANT PARACENTESIS MEDICATIONS: 10 mL 1 % lidocaine COMPLICATIONS: None immediate. PROCEDURE: Informed written consent was obtained from the patient after a discussion of the risks, benefits and alternatives to treatment. A timeout was performed prior to the initiation of the procedure. Initial ultrasound scanning demonstrates a large amount of ascites within the right lower abdominal quadrant. The right lower abdomen was prepped and draped in the usual sterile fashion. 1% lidocaine was used for local anesthesia. Following this, a 19 gauge, 7-cm, Yueh catheter was introduced. An ultrasound image was saved for documentation purposes. The paracentesis was performed. The catheter was removed and a dressing was applied. The patient tolerated the procedure well without immediate post procedural complication. FINDINGS: A total of approximately 4.4 L of clear, yellow fluid was removed. IMPRESSION: Successful ultrasound-guided paracentesis yielding 4.4 liters of peritoneal fluid. Read by: Narda Rutherford, AGNP-BC PLAN: The patient has previously been evaluated by the [facility]pertension [facility], and deemed not a candidate for intervention. Michaelle Birks, MD [facility] Radiology Electronically Signed   By: Michaelle Birks M.D.   On: 03/06/2022 16:45     Medications:      amiodarone  200 mg Oral Daily   Chlorhexidine Gluconate Cloth  6 each Topical Q0600   epoetin (EPOGEN/PROCRIT) injection  10,000 Units Intravenous Q T,Th,Sa-HD   lactulose   30 g Oral TID   midodrine  10 mg Oral TID WC   multivitamin with minerals  1 tablet Oral Daily   pantoprazole (PROTONIX) IV  40 mg Intravenous Q12H   rifaximin  550 mg Oral BID   senna-docusate  1 tablet Oral BID   torsemide  80 mg Oral Daily   acetaminophen **OR** acetaminophen (TYLENOL) oral liquid 160 mg/5 mL **OR** acetaminophen, labetalol, magnesium hydroxide, metoCLOPramide (REGLAN) injection, nitroGLYCERIN, mouth rinse, simethicone  Assessment/ Plan:  [name] is a 81 y.o.  female with past medical history including liver cirrhosis, anemia, diverticulitis, hypertension, CHF, CAD, and end stage renal disease on hemodialysis. She presents to the emergency department with confusion. She has been admitted for Subdural hemorrhage ([place]) [I62.00] Hyperammonemia (HCC) [E72.20] Encephalopathy [G93.40] Acute on chronic intracranial subdural hematoma (HCC) [I62.01, I62.03]  [facility]/TTS/right chest PermCath  End stage renal disease on hemodialysis.  Will maintain outpatient schedule if possible.  Dialysis received yesterday, UF goal reduced due to hypotension.  UF 100 mL achieved.  Next treatment scheduled  for Thursday.  Patient cleared to discharge from renal stance and continue treatments at assigned outpatient clinic.   2. Anemia of chronic kidney disease  Lab Results  Component Value Date   HGB 9.5 (L) 03/08/2022  Hgb at goal, will reduce to low dose EPO with dialysis treatments.   3. Secondary Hyperparathyroidism:   Lab Results  Component Value Date   PTH 32 02/03/2022   CALCIUM 8.6 (L) 03/07/2022   CAION 1.17 03/25/2016   PHOS 3.4 02/07/2022    Calcium and phosphorus within desired goal.  4.  Subdural hemorrhage, seen on CT scan.  Neurosurgery consulted, no surgical interventions.    LOS: 5 Candela Krul 11/8/202310:45 AM

## 2022-03-09 ENCOUNTER — Emergency Department: Payer: Medicare Other

## 2022-03-09 ENCOUNTER — Other Ambulatory Visit: Payer: Self-pay

## 2022-03-09 ENCOUNTER — Encounter: Payer: Self-pay | Admitting: Emergency Medicine

## 2022-03-09 ENCOUNTER — Emergency Department
Admission: EM | Admit: 2022-03-09 | Discharge: 2022-03-09 | Disposition: A | Discharge status: Home / Self Care | Payer: Medicare Other | Attending: Emergency Medicine | Admitting: Emergency Medicine

## 2022-03-09 DIAGNOSIS — S0101XA Laceration without foreign body of scalp, initial encounter: Secondary | ICD-10-CM | POA: Insufficient documentation

## 2022-03-09 DIAGNOSIS — E1122 Type 2 diabetes mellitus with diabetic chronic kidney disease: Secondary | ICD-10-CM | POA: Insufficient documentation

## 2022-03-09 DIAGNOSIS — J449 Chronic obstructive pulmonary disease, unspecified: Secondary | ICD-10-CM | POA: Diagnosis not present

## 2022-03-09 DIAGNOSIS — S0990XA Unspecified injury of head, initial encounter: Secondary | ICD-10-CM | POA: Diagnosis not present

## 2022-03-09 DIAGNOSIS — I2510 Atherosclerotic heart disease of native coronary artery without angina pectoris: Secondary | ICD-10-CM | POA: Diagnosis not present

## 2022-03-09 DIAGNOSIS — N250 Renal osteodystrophy: Secondary | ICD-10-CM | POA: Diagnosis not present

## 2022-03-09 DIAGNOSIS — N186 End stage renal disease: Secondary | ICD-10-CM | POA: Diagnosis not present

## 2022-03-09 DIAGNOSIS — I132 Hypertensive heart and chronic kidney disease with heart failure and with stage 5 chronic kidney disease, or end stage renal disease: Secondary | ICD-10-CM | POA: Diagnosis not present

## 2022-03-09 DIAGNOSIS — K7682 Hepatic encephalopathy: Secondary | ICD-10-CM | POA: Diagnosis not present

## 2022-03-09 DIAGNOSIS — I480 Paroxysmal atrial fibrillation: Secondary | ICD-10-CM | POA: Insufficient documentation

## 2022-03-09 DIAGNOSIS — I5022 Chronic systolic (congestive) heart failure: Secondary | ICD-10-CM | POA: Diagnosis not present

## 2022-03-09 DIAGNOSIS — Z23 Encounter for immunization: Secondary | ICD-10-CM | POA: Diagnosis not present

## 2022-03-09 DIAGNOSIS — D649 Anemia, unspecified: Secondary | ICD-10-CM | POA: Diagnosis not present

## 2022-03-09 DIAGNOSIS — Z041 Encounter for examination and observation following transport accident: Secondary | ICD-10-CM | POA: Diagnosis not present

## 2022-03-09 DIAGNOSIS — I482 Chronic atrial fibrillation, unspecified: Secondary | ICD-10-CM | POA: Diagnosis not present

## 2022-03-09 DIAGNOSIS — W050XXA Fall from non-moving wheelchair, initial encounter: Secondary | ICD-10-CM | POA: Insufficient documentation

## 2022-03-09 DIAGNOSIS — I5032 Chronic diastolic (congestive) heart failure: Secondary | ICD-10-CM | POA: Insufficient documentation

## 2022-03-09 DIAGNOSIS — D509 Iron deficiency anemia, unspecified: Secondary | ICD-10-CM | POA: Diagnosis not present

## 2022-03-09 DIAGNOSIS — N189 Chronic kidney disease, unspecified: Secondary | ICD-10-CM | POA: Diagnosis not present

## 2022-03-09 DIAGNOSIS — Z992 Dependence on renal dialysis: Secondary | ICD-10-CM | POA: Insufficient documentation

## 2022-03-09 DIAGNOSIS — N179 Acute kidney failure, unspecified: Secondary | ICD-10-CM | POA: Diagnosis not present

## 2022-03-09 DIAGNOSIS — D631 Anemia in chronic kidney disease: Secondary | ICD-10-CM | POA: Diagnosis not present

## 2022-03-09 LAB — BODY FLUID CULTURE W GRAM STAIN: Culture: NO GROWTH

## 2022-03-09 MED ORDER — TETANUS-DIPHTH-ACELL PERTUSSIS 5-2.5-18.5 LF-MCG/0.5 IM SUSY
0.5000 mL | PREFILLED_SYRINGE | Freq: Once | INTRAMUSCULAR | Status: AC
  Administered 2022-03-09: 0.5 mL via INTRAMUSCULAR
  Filled 2022-03-09: qty 0.5

## 2022-03-09 NOTE — ED Provider Notes (Signed)
[facility] Provider Note    Event Date/Time   First MD Initiated Contact with Patient 03/09/22 1120     (approximate)   History   Fall   HPI  April [name] is a 81 y.o. female with a past medical history of end-stage renal disease on dialysis, cirrhosis, heart failure who presents today for evaluation after a fall.  Patient reports that she was in a wheelchair and the wheelchair was going up a ramp and tipped over and she struck her head.  There was no LOC.  She denies significant headache now.  No vision changes.  She has not had any vomiting.  She is not on anticoagulation.  Patient Active Problem List   Diagnosis Date Noted   Subdural hemorrhage ([place]) 03/03/2022   End-stage renal disease on hemodialysis ([place]) 03/03/2022   GERD without esophagitis 03/03/2022   Encephalopathy 03/03/2022   [place]    Pressure injury of skin 02/05/2022   Shock ([place])    Severe sepsis ([place]) 02/01/2022   Anasarca 02/01/2022   Prolonged QT interval 02/01/2022   ESRD on dialysis ([place]) 01/09/2022   HTN (hypertension) 01/09/2022   COPD exacerbation ([place]) 01/09/2022   Sepsis ([place]) 01/09/2022   Anemia in ESRD (end-stage renal disease) ([place]) 01/09/2022   Thrombocytopenia ([place]) 50/12/3816   Acute metabolic encephalopathy 29/93/7169   Acute hepatic encephalopathy ([place]) 12/09/2021   Cirrhosis of liver with ascites ([place]) 12/09/2021   Edema and ascites  12/08/2021   Hyponatremia 12/07/2021   Anemia of chronic kidney failure, stage 4 (severe) ([place]) 12/07/2021   Weakness 10/29/2021   Acute respiratory failure with hypoxia ([place]) 10/18/2021   History of recurrent GI bleed on apixaban 10/17/2021   History of adverse reaction to anticoagulant medication 10/17/2021   Electrolyte abnormality 10/17/2021   Hypokalemia 08/11/2021   Chronic diastolic CHF (congestive heart failure) ([place]) 08/08/2021   Chronic obstructive pulmonary disease, unspecified COPD type ([place]) 08/01/2021   Iron  deficiency anemia    Symptomatic anemia    Upper GI bleed 07/07/2021   Thrombophilia ([place]) 04/10/2021   Aortic atherosclerosis ([place]) 01/03/2021   Cellulitis of leg, left 10/26/2020   Breast tenderness 07/14/2020   Abnormal liver function tests 03/11/2020   Lymphedema 07/09/2018   Acute renal failure superimposed on stage 4 chronic kidney disease ([place]) 06/03/2018   Lower extremity edema 01/19/2018   Arthropathy of lumbar facet joint 08/08/2017   Degeneration of lumbar intervertebral disc 08/08/2017   Osteoarthritis of knee 08/08/2017   Paroxysmal atrial fibrillation ([place]) 03/27/2016   CAD S/P PCI pRCA Promus DES 3.5 x 16 (4.1 mm), ostRPDA Promus DES 2.5 x 12 (2.7 mm) 03/25/2016   Coronary artery disease involving native coronary artery of native heart with unstable angina pectoris ([place]) 06/01/2015   Diabetes mellitus ([place]) 05/16/2015   Gastritis 04/01/2015   Chest pain 04/01/2015   Hiatal hernia 08/25/2014   DOE (dyspnea on exertion) 07/15/2014   Congestion of throat 07/06/2014   Health care maintenance 07/06/2014   Fibrocystic breast disease 07/06/2014   Morbid obesity ([place]) 04/19/2014   Stress 02/16/2013   Rib pain on right side 02/16/2013   Acute on Chronic dyspnea 10/13/2012   GERD (gastroesophageal reflux disease) 10/13/2012   Essential hypertension, benign 08/18/2012   Hyperlipidemia 08/18/2012   History of colonic polyps 08/18/2012   OSA on CPAP 08/18/2012   Osteoporosis 08/18/2012          Physical Exam   Triage Vital Signs: ED Triage Vitals  Enc Vitals  Group     BP 03/09/22 1103 (!) 103/37     Pulse Rate 03/09/22 1103 83     Resp 03/09/22 1103 20     Temp 03/09/22 1103 97.8 F (36.6 C)     Temp src --      SpO2 03/09/22 1103 96 %     Weight 03/09/22 1104 160 lb (72.6 kg)     Height 03/09/22 1104 '4\' 9"'$  (1.448 m)     Head Circumference --      Peak Flow --      Pain Score 03/09/22 1104 8     Pain Loc --      Pain Edu? --      Excl. in [place]? --      Most recent vital signs: Vitals:   03/09/22 1103  BP: (!) 103/37  Pulse: 83  Resp: 20  Temp: 97.8 F (36.6 C)  SpO2: 96%    Physical Exam Vitals and nursing note reviewed.  Constitutional:      General: Awake and alert. No acute distress.    Appearance: Normal appearance. The patient is normal weight.  HENT:     Head: Normocephalic.  0.25 cm laceration to occiput.  No hematoma.  No battle sign or raccoon eyes.    Mouth: Mucous membranes are moist.  Eyes:     General: PERRL. Normal EOMs        Right eye: No discharge.        Left eye: No discharge.     Conjunctiva/sclera: Conjunctivae normal.  Cardiovascular:     Rate and Rhythm: Normal rate and regular rhythm.     Pulses: Normal pulses.  Pulmonary:     Effort: Pulmonary effort is normal. No respiratory distress.  Abdominal:     Abdomen is soft. There is no abdominal tenderness. No rebound or guarding. No distention. Musculoskeletal:        General: No swelling. Normal range of motion.     Cervical back: Normal range of motion and neck supple.  No midline cervical spine tenderness.  Full range of motion of neck.  Negative Spurling test.  Negative Lhermitte sign.  Normal strength and sensation in bilateral upper extremities. Normal grip strength bilaterally.  Normal intrinsic muscle function of the hand bilaterally.  Normal radial pulses bilaterally. Skin:    General: Skin is warm and dry.     Capillary Refill: Capillary refill takes less than 2 seconds.     Findings: No rash.  Neurological:     Mental Status: The patient is awake and alert.   Neurological: GCS 15 alert and oriented x3 Normal speech, no expressive or receptive aphasia or dysarthria Cranial nerves II through XII intact Normal visual fields 5 out of 5 strength in all 4 extremities with intact sensation throughout No extremity drift Normal finger-to-nose testing, no limb or truncal ataxia   ED Results / Procedures / Treatments   Labs (all labs  ordered are listed, but only abnormal results are displayed) Labs Reviewed - No data to display   EKG     RADIOLOGY I independently reviewed and interpreted imaging and agree with radiologists findings.     PROCEDURES:  Critical Care performed:   Procedures   MEDICATIONS ORDERED IN ED: Medications  Tdap (BOOSTRIX) injection 0.5 mL (0.5 mLs Intramuscular Given 03/09/22 1201)     IMPRESSION / MDM / ASSESSMENT AND PLAN / ED COURSE  I reviewed the triage vital signs and the nursing notes.   Differential diagnosis  includes, but is not limited to, intracranial hemorrhage, scalp laceration, seizure cervical spine injury, concussion, contusion.  Patient is awake alert, hemodynamically stable and afebrile.  She is at her mental baseline per her family member who is with her.  She has no focal neurological deficits.  She has a very small 0.25 superficial laceration to the back of her head without underlying hematoma.  There is no active bleeding.  I discussed the options of closure with 1 staple versus no closure.  Patient and family member prefer no closure which is appropriate given the size of the wound.  The area was irrigated and cleaned.  CT head and neck obtained per [place] criteria and demonstrate no change from her recent subdural.  Family member was able to coordinate her dialysis so that she may return.  We discussed return precautions and the importance of close outpatient follow-up.  Patient and phone number understand and agree with plan.  Discharged in stable condition.   Patient's presentation is most consistent with acute presentation with potential threat to life or bodily function.   FINAL CLINICAL IMPRESSION(S) / ED DIAGNOSES   Final diagnoses:  Injury of head, initial encounter  Laceration of scalp, initial encounter     Rx / DC Orders   ED Discharge Orders     None        Note:  This document was prepared using Dragon voice recognition software and  may include unintentional dictation errors.   Emeline Gins 03/09/22 1215    Harvest Dark, MD 03/09/22 1355

## 2022-03-09 NOTE — ED Triage Notes (Addendum)
Pt was on her way to dialysis when her wheelchair tipped back and she hit her head. Pt did not have loss of consciousness and reports not being on any blood thinners. Son states that pt hit her head on a metal piece of the wheelchair ramp. Dried blood noted to the back of her head.  Son states patients BP is normally low as well

## 2022-03-09 NOTE — Discharge Instructions (Signed)
Your CT scans today were no different from your CT scan 3 days ago, no new bleeds noted.  Please return for any new, worsening, or changes symptoms or other concerns including worsening headache, vomiting, vision changes, or any other concerns.

## 2022-03-09 NOTE — Telephone Encounter (Signed)
I spoke to [place] and he is aware that [facility] will be calling to schedule the CT scan and Dr.Yarbrough will call with the results.

## 2022-03-10 ENCOUNTER — Other Ambulatory Visit: Payer: Medicare Other

## 2022-03-10 ENCOUNTER — Non-Acute Institutional Stay: Payer: Medicare Other | Admitting: Student

## 2022-03-10 ENCOUNTER — Ambulatory Visit: Payer: Medicare Other | Admitting: Internal Medicine

## 2022-03-10 ENCOUNTER — Ambulatory Visit: Payer: Medicare Other

## 2022-03-10 DIAGNOSIS — R5381 Other malaise: Secondary | ICD-10-CM

## 2022-03-10 DIAGNOSIS — I6200 Nontraumatic subdural hemorrhage, unspecified: Secondary | ICD-10-CM | POA: Diagnosis not present

## 2022-03-10 DIAGNOSIS — Z992 Dependence on renal dialysis: Secondary | ICD-10-CM | POA: Diagnosis not present

## 2022-03-10 DIAGNOSIS — Z515 Encounter for palliative care: Secondary | ICD-10-CM

## 2022-03-10 DIAGNOSIS — K7682 Hepatic encephalopathy: Secondary | ICD-10-CM

## 2022-03-10 DIAGNOSIS — I5032 Chronic diastolic (congestive) heart failure: Secondary | ICD-10-CM

## 2022-03-10 DIAGNOSIS — N186 End stage renal disease: Secondary | ICD-10-CM

## 2022-03-10 DIAGNOSIS — R531 Weakness: Secondary | ICD-10-CM | POA: Diagnosis not present

## 2022-03-10 NOTE — Progress Notes (Unsigned)
[facility] Consult Note Telephone: [phone]  Fax: [phone]   Date of encounter: 03/10/22 3:24 PM PATIENT NAME: April [name] [address] [place] [address]   [phone] (home)  DOB: 1940/12/21 MRN: [id number] PRIMARY CARE PROVIDER:    Einar Pheasant, MD,  [address] [place] [address] [phone]  [place]:   April [name], [address] [place],  [place] [address] [phone]  RESPONSIBLE PARTY:    Contact Information     Name Relation Home Work [place] Son  [phone]   April [name] [phone]         I met face to face with patient in the facility. Palliative Care was asked to follow this patient by consultation request of  April Pheasant, MD to address advance care planning and complex medical decision making. This is the initial visit.                                     ASSESSMENT AND PLAN / RECOMMENDATIONS:   Advance Care Planning/Goals of Care: Goals include to maximize quality of life and symptom management. Patient/health care surrogate gave his/her permission to discuss.Our advance care planning conversation included a discussion about:    The value and importance of advance care planning  Experiences with loved ones who have been seriously ill or have died  Exploration of personal, cultural or spiritual beliefs that might influence medical decisions  Exploration of goals of care in the event of a sudden injury or illness  Identification  of a healthcare agent  Review and updating or creation of an  advance directive document . Decision not to resuscitate or to de-escalate disease focused treatments due to poor prognosis. CODE STATUS:  Symptom Management/Plan:    Follow up Palliative Care Visit: Palliative care will continue to follow for complex medical decision  making, advance care planning, and clarification of goals. Return *** weeks or prn.  I spent *** minutes providing this consultation. More than 50% of the time in this consultation was spent in counseling and care coordination.  This visit was coded based on medical decision making (MDM).***  PPS: ***0%  HOSPICE ELIGIBILITY/DIAGNOSIS: TBD  Chief Complaint: ***  HISTORY OF PRESENT ILLNESS:  April [name] is a 81 y.o. year old female  with *** .   History obtained from review of EMR, discussion with primary team, and interview with family, facility staff/caregiver and/or [name].  I reviewed available labs, medications, imaging, studies and related documents from the EMR.  Records reviewed and summarized above.   ROS  *** General: NAD EYES: denies vision changes ENMT: denies dysphagia Cardiovascular: denies chest pain, denies DOE Pulmonary: denies cough, denies increased SOB Abdomen: endorses good appetite, denies constipation, endorses continence of bowel GU: denies dysuria, endorses continence of urine MSK:  denies increased weakness,  no falls reported Skin: denies rashes or wounds Neurological: denies pain, denies insomnia Psych: Endorses positive mood Heme/lymph/immuno: denies bruises, abnormal bleeding  Physical Exam: Current and past weights: Constitutional: NAD General: frail appearing, thin/WNWD/obese  EYES: anicteric sclera, lids intact, no discharge  ENMT: intact hearing, oral mucous membranes moist, dentition intact CV: S1S2, RRR, no LE edema Pulmonary: LCTA, no increased work of breathing, no cough, room air Abdomen: intake 100%, normo-active BS + 4 quadrants, soft and non tender, no ascites GU: deferred MSK:  no sarcopenia, moves all extremities, ambulatory Skin: warm and dry, no rashes or wounds on visible skin Neuro:  no generalized weakness,  no cognitive impairment Psych: non-anxious affect, A and O x 3 Hem/lymph/immuno: no widespread  bruising CURRENT PROBLEM LIST:  Patient Active Problem List   Diagnosis Date Noted   Subdural hemorrhage ([place]) 03/03/2022   End-stage renal disease on hemodialysis ([place]) 03/03/2022   GERD without esophagitis 03/03/2022   Encephalopathy 03/03/2022   Melena    Pressure injury of skin 02/05/2022   Shock ([place])    Severe sepsis ([place]) 02/01/2022   Anasarca 02/01/2022   Prolonged QT interval 02/01/2022   ESRD on dialysis ([place]) 01/09/2022   HTN (hypertension) 01/09/2022   COPD exacerbation ([place]) 01/09/2022   Sepsis ([place]) 01/09/2022   Anemia in ESRD (end-stage renal disease) ([place]) 01/09/2022   Thrombocytopenia ([place]) 78/67/6720   Acute metabolic encephalopathy 94/70/9628   Acute hepatic encephalopathy ([place]) 12/09/2021   Cirrhosis of liver with ascites ([place]) 12/09/2021   Edema and ascites  12/08/2021   Hyponatremia 12/07/2021   Anemia of chronic kidney failure, stage 4 (severe) ([place]) 12/07/2021   Weakness 10/29/2021   Acute respiratory failure with hypoxia ([place]) 10/18/2021   History of recurrent GI bleed on apixaban 10/17/2021   History of adverse reaction to anticoagulant medication 10/17/2021   Electrolyte abnormality 10/17/2021   Hypokalemia 08/11/2021   Chronic diastolic CHF (congestive heart failure) ([place]) 08/08/2021   Chronic obstructive pulmonary disease, unspecified COPD type ([place]) 08/01/2021   Iron deficiency anemia    Symptomatic anemia    Upper GI bleed 07/07/2021   Thrombophilia ([place]) 04/10/2021   Aortic atherosclerosis ([place]) 01/03/2021   Cellulitis of leg, left 10/26/2020   Breast tenderness 07/14/2020   Abnormal liver function tests 03/11/2020   Lymphedema 07/09/2018   Acute renal failure superimposed on stage 4 chronic kidney disease ([place]) 06/03/2018   Lower extremity edema 01/19/2018   Arthropathy of lumbar facet joint 08/08/2017   Degeneration of lumbar intervertebral disc 08/08/2017   Osteoarthritis of knee 08/08/2017   Paroxysmal atrial fibrillation ([place])  03/27/2016   CAD S/P PCI pRCA Promus DES 3.5 x 16 (4.1 mm), ostRPDA Promus DES 2.5 x 12 (2.7 mm) 03/25/2016   Coronary artery disease involving native coronary artery of native heart with unstable angina pectoris ([place]) 06/01/2015   Diabetes mellitus ([place]) 05/16/2015   Gastritis 04/01/2015   Chest pain 04/01/2015   Hiatal hernia 08/25/2014   DOE (dyspnea on exertion) 07/15/2014   Congestion of throat 07/06/2014   Health care maintenance 07/06/2014   Fibrocystic breast disease 07/06/2014   Morbid obesity ([place]) 04/19/2014   Stress 02/16/2013   Rib pain on right side 02/16/2013   Acute on Chronic dyspnea 10/13/2012   GERD (gastroesophageal reflux disease) 10/13/2012   Essential hypertension, benign 08/18/2012   Hyperlipidemia 08/18/2012   History of colonic polyps 08/18/2012   OSA on CPAP 08/18/2012   Osteoporosis 08/18/2012   PAST MEDICAL HISTORY:  Active Ambulatory Problems    Diagnosis Date Noted   Essential hypertension, benign 08/18/2012   Hyperlipidemia 08/18/2012   History of colonic polyps 08/18/2012   OSA on CPAP 08/18/2012   Osteoporosis 08/18/2012   Acute on Chronic dyspnea 10/13/2012   GERD (gastroesophageal reflux disease) 10/13/2012   Stress 02/16/2013   Rib pain on right side 02/16/2013   Morbid obesity ([place]) 04/19/2014   Congestion of throat 07/06/2014   Health care maintenance 07/06/2014   Fibrocystic breast disease 07/06/2014   DOE (dyspnea on exertion) 07/15/2014  Hiatal hernia 08/25/2014   Gastritis 04/01/2015   Chest pain 04/01/2015   Diabetes mellitus ([place]) 05/16/2015   Coronary artery disease involving native coronary artery of native heart with unstable angina pectoris ([place]) 06/01/2015   CAD S/P PCI pRCA Promus DES 3.5 x 16 (4.1 mm), ostRPDA Promus DES 2.5 x 12 (2.7 mm) 03/25/2016   Paroxysmal atrial fibrillation ([place]) 03/27/2016   Lower extremity edema 01/19/2018   Acute renal failure superimposed on stage 4 chronic kidney disease ([place]) 06/03/2018    Lymphedema 07/09/2018   Arthropathy of lumbar facet joint 08/08/2017   Degeneration of lumbar intervertebral disc 08/08/2017   Osteoarthritis of knee 08/08/2017   Abnormal liver function tests 03/11/2020   Breast tenderness 07/14/2020   Cellulitis of leg, left 10/26/2020   Aortic atherosclerosis ([place]) 01/03/2021   Thrombophilia ([place]) 04/10/2021   Upper GI bleed 07/07/2021   Symptomatic anemia    Iron deficiency anemia    Chronic obstructive pulmonary disease, unspecified COPD type ([place]) 08/01/2021   Chronic diastolic CHF (congestive heart failure) ([place]) 08/08/2021   Hypokalemia 08/11/2021   History of recurrent GI bleed on apixaban 10/17/2021   History of adverse reaction to anticoagulant medication 10/17/2021   Electrolyte abnormality 10/17/2021   Acute respiratory failure with hypoxia ([place]) 10/18/2021   Weakness 10/29/2021   Hyponatremia 12/07/2021   Anemia of chronic kidney failure, stage 4 (severe) ([place]) 12/07/2021   Edema and ascites  54/00/8676   Acute metabolic encephalopathy 19/50/9326   Acute hepatic encephalopathy ([place]) 12/09/2021   Cirrhosis of liver with ascites ([place]) 12/09/2021   Thrombocytopenia ([place]) 12/13/2021   ESRD on dialysis ([place]) 01/09/2022   HTN (hypertension) 01/09/2022   COPD exacerbation ([place]) 01/09/2022   Sepsis ([place]) 01/09/2022   Anemia in ESRD (end-stage renal disease) ([place]) 01/09/2022   Severe sepsis ([place]) 02/01/2022   Anasarca 02/01/2022   Prolonged QT interval 02/01/2022   Shock ([place])    Pressure injury of skin 02/05/2022   Melena    Subdural hemorrhage ([place]) 03/03/2022   End-stage renal disease on hemodialysis ([place]) 03/03/2022   GERD without esophagitis 03/03/2022   Encephalopathy 03/03/2022   Resolved Ambulatory Problems    Diagnosis Date Noted   Right knee pain 11/13/2015   ST elevation myocardial infarction (STEMI) of inferolateral wall, initial episode of care ([place]) 03/25/2016   Breast pain 12/15/2018   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    Gastric erythema    Acute on chronic congestive heart failure (HCC)    Chronic kidney disease (CKD), stage IV (severe) ([place]) 11/29/2021   Past Medical History:  Diagnosis Date   Anemia    CHF (congestive heart failure) (HCC)    CKD (chronic kidney disease), stage III ([place])    Diabetes mellitus without complication ([place])    Diastolic dysfunction    Diverticulitis    Dyspnea    Endometriosis    Family history of adverse reaction to anesthesia    GI bleed    History of colon polyps 08/1994   History of Migraines    Hypercholesterolemia    Hypertension    LBBB (left bundle branch block)    PAF (paroxysmal atrial fibrillation) (HCC)    Rheumatic fever    Sleep apnea    Spasmodic dysphonia    Urine incontinence    SOCIAL HX:  Social History   Tobacco Use   Smoking status: Never   Smokeless tobacco: Never  Substance Use Topics   Alcohol use: No    Alcohol/week: 0.0 standard drinks of alcohol  FAMILY HX:  Family History  Problem Relation Age of Onset   Arthritis Mother    Stroke Mother    Hypertension Mother    Osteoporosis Mother    Heart disease Father        MI   Heart attack Father    Stroke Brother    Heart attack Sister    Breast cancer Other        first cousin x 2   Stroke Daughter    Heart attack Daughter    Colon cancer Neg Hx       ALLERGIES:  Allergies  Allergen Reactions   Penicillins Other (See Comments)    Okay to take amoxicillin/(pt does not recall what the reaction to penicillin was (82-43 years old)    Sulfa Antibiotics Rash     PERTINENT MEDICATIONS:  Outpatient Encounter Medications as of 03/10/2022  Medication Sig   acetaminophen (TYLENOL) 325 MG tablet Take 650 mg by mouth every 4 (four) hours as needed for mild pain or fever.   amiodarone (PACERONE) 200 MG tablet Take 1 tablet (200 mg total) by mouth daily.   epoetin alfa (EPOGEN) 10000 UNIT/ML injection Inject 0.8 mLs (8,000 Units total) into the vein Every Tuesday,Thursday,and  Saturday with dialysis.   hydrocortisone (ANUSOL-HC) 25 MG suppository Place 25 mg rectally 2 (two) times daily.   lactulose (CHRONULAC) 10 GM/15ML solution Take 45 mLs (30 g total) by mouth 3 (three) times daily.   midodrine (PROAMATINE) 10 MG tablet Take 1 tablet (10 mg total) by mouth 3 (three) times daily with meals.   Multiple Vitamins-Minerals (MULTIVITAMIN WITH MINERALS) tablet Take 1 tablet by mouth daily.   nitroGLYCERIN (NITROSTAT) 0.4 MG SL tablet Place 1 tablet (0.4 mg total) under the tongue every 5 (five) minutes as needed for chest pain.   pantoprazole (PROTONIX) 40 MG tablet Take 1 tablet (40 mg total) by mouth 2 (two) times daily before a meal.   rifaximin (XIFAXAN) 550 MG TABS tablet Take 1 tablet (550 mg total) by mouth 2 (two) times daily.   simethicone (MYLICON) 80 MG chewable tablet Chew 1 tablet (80 mg total) by mouth 4 (four) times daily as needed for flatulence.   torsemide 40 MG TABS Take 80 mg by mouth daily.   No facility-administered encounter medications on file as of 03/10/2022.   Thank you for the opportunity to participate in the care of [name].  The palliative care team will continue to follow. Please call our office at [phone] if we can be of additional assistance.   Ezekiel Slocumb, NP ,   COVID-19 PATIENT SCREENING TOOL Asked and negative response unless otherwise noted:  Have you had symptoms of covid, tested positive or been in contact with someone with symptoms/positive test in the past 5-10 days?

## 2022-03-11 DIAGNOSIS — N179 Acute kidney failure, unspecified: Secondary | ICD-10-CM | POA: Diagnosis not present

## 2022-03-11 DIAGNOSIS — N250 Renal osteodystrophy: Secondary | ICD-10-CM | POA: Diagnosis not present

## 2022-03-11 DIAGNOSIS — D649 Anemia, unspecified: Secondary | ICD-10-CM | POA: Diagnosis not present

## 2022-03-11 DIAGNOSIS — D509 Iron deficiency anemia, unspecified: Secondary | ICD-10-CM | POA: Diagnosis not present

## 2022-03-11 DIAGNOSIS — N189 Chronic kidney disease, unspecified: Secondary | ICD-10-CM | POA: Diagnosis not present

## 2022-03-13 ENCOUNTER — Ambulatory Visit: Payer: Medicare Other

## 2022-03-13 ENCOUNTER — Telehealth: Payer: Self-pay | Admitting: Nurse Practitioner

## 2022-03-13 ENCOUNTER — Other Ambulatory Visit: Payer: Self-pay

## 2022-03-13 ENCOUNTER — Telehealth: Payer: Self-pay

## 2022-03-13 ENCOUNTER — Inpatient Hospital Stay: Payer: Medicare Other | Admitting: Internal Medicine

## 2022-03-13 MED FILL — Iron Sucrose Inj 20 MG/ML (Fe Equiv): INTRAVENOUS | Qty: 10 | Status: AC

## 2022-03-13 NOTE — Telephone Encounter (Signed)
Pt has been discharged to SNF.  Will follow up when discharged from SNF.

## 2022-03-13 NOTE — Telephone Encounter (Signed)
Patients son called to say she was just released from the hospital and received blood. He would like to reschedule the appointments for this week. Please advise. Thank you

## 2022-03-14 ENCOUNTER — Inpatient Hospital Stay: Payer: Medicare Other

## 2022-03-14 ENCOUNTER — Inpatient Hospital Stay: Payer: Medicare Other | Admitting: Nurse Practitioner

## 2022-03-14 DIAGNOSIS — N179 Acute kidney failure, unspecified: Secondary | ICD-10-CM | POA: Diagnosis not present

## 2022-03-14 DIAGNOSIS — D649 Anemia, unspecified: Secondary | ICD-10-CM | POA: Diagnosis not present

## 2022-03-14 DIAGNOSIS — N250 Renal osteodystrophy: Secondary | ICD-10-CM | POA: Diagnosis not present

## 2022-03-14 DIAGNOSIS — D509 Iron deficiency anemia, unspecified: Secondary | ICD-10-CM | POA: Diagnosis not present

## 2022-03-14 DIAGNOSIS — N189 Chronic kidney disease, unspecified: Secondary | ICD-10-CM | POA: Diagnosis not present

## 2022-03-15 ENCOUNTER — Other Ambulatory Visit: Payer: Medicare Other

## 2022-03-15 ENCOUNTER — Ambulatory Visit: Payer: Medicare Other

## 2022-03-15 ENCOUNTER — Ambulatory Visit: Payer: Medicare Other | Admitting: Internal Medicine

## 2022-03-16 DIAGNOSIS — D649 Anemia, unspecified: Secondary | ICD-10-CM | POA: Diagnosis not present

## 2022-03-16 DIAGNOSIS — D509 Iron deficiency anemia, unspecified: Secondary | ICD-10-CM | POA: Diagnosis not present

## 2022-03-16 DIAGNOSIS — I482 Chronic atrial fibrillation, unspecified: Secondary | ICD-10-CM | POA: Diagnosis not present

## 2022-03-16 DIAGNOSIS — Z992 Dependence on renal dialysis: Secondary | ICD-10-CM | POA: Diagnosis not present

## 2022-03-16 DIAGNOSIS — N179 Acute kidney failure, unspecified: Secondary | ICD-10-CM | POA: Diagnosis not present

## 2022-03-16 DIAGNOSIS — K7682 Hepatic encephalopathy: Secondary | ICD-10-CM | POA: Diagnosis not present

## 2022-03-16 DIAGNOSIS — K7460 Unspecified cirrhosis of liver: Secondary | ICD-10-CM | POA: Diagnosis not present

## 2022-03-16 DIAGNOSIS — N189 Chronic kidney disease, unspecified: Secondary | ICD-10-CM | POA: Diagnosis not present

## 2022-03-16 DIAGNOSIS — N186 End stage renal disease: Secondary | ICD-10-CM | POA: Diagnosis not present

## 2022-03-16 DIAGNOSIS — N250 Renal osteodystrophy: Secondary | ICD-10-CM | POA: Diagnosis not present

## 2022-03-16 DIAGNOSIS — R188 Other ascites: Secondary | ICD-10-CM | POA: Diagnosis not present

## 2022-03-17 ENCOUNTER — Other Ambulatory Visit: Payer: Self-pay | Admitting: Gastroenterology

## 2022-03-17 ENCOUNTER — Ambulatory Visit: Payer: Medicare Other

## 2022-03-17 DIAGNOSIS — R188 Other ascites: Secondary | ICD-10-CM

## 2022-03-18 ENCOUNTER — Other Ambulatory Visit: Payer: Self-pay

## 2022-03-18 ENCOUNTER — Inpatient Hospital Stay
Admission: EM | Admit: 2022-03-18 | Discharge: 2022-03-31 | DRG: 871 | Disposition: E | Payer: Medicare Other | Source: Other Acute Inpatient Hospital | Attending: Pulmonary Disease | Admitting: Pulmonary Disease

## 2022-03-18 ENCOUNTER — Emergency Department: Payer: Medicare Other

## 2022-03-18 ENCOUNTER — Inpatient Hospital Stay: Payer: Medicare Other

## 2022-03-18 DIAGNOSIS — E1122 Type 2 diabetes mellitus with diabetic chronic kidney disease: Secondary | ICD-10-CM | POA: Diagnosis present

## 2022-03-18 DIAGNOSIS — K7210 Chronic hepatic failure without coma: Secondary | ICD-10-CM | POA: Diagnosis present

## 2022-03-18 DIAGNOSIS — G4730 Sleep apnea, unspecified: Secondary | ICD-10-CM | POA: Diagnosis present

## 2022-03-18 DIAGNOSIS — R579 Shock, unspecified: Principal | ICD-10-CM

## 2022-03-18 DIAGNOSIS — K7581 Nonalcoholic steatohepatitis (NASH): Secondary | ICD-10-CM | POA: Diagnosis present

## 2022-03-18 DIAGNOSIS — A408 Other streptococcal sepsis: Principal | ICD-10-CM | POA: Diagnosis present

## 2022-03-18 DIAGNOSIS — J9691 Respiratory failure, unspecified with hypoxia: Secondary | ICD-10-CM | POA: Diagnosis not present

## 2022-03-18 DIAGNOSIS — Z515 Encounter for palliative care: Secondary | ICD-10-CM | POA: Diagnosis not present

## 2022-03-18 DIAGNOSIS — K219 Gastro-esophageal reflux disease without esophagitis: Secondary | ICD-10-CM | POA: Diagnosis present

## 2022-03-18 DIAGNOSIS — R000 Tachycardia, unspecified: Secondary | ICD-10-CM | POA: Diagnosis not present

## 2022-03-18 DIAGNOSIS — Z7901 Long term (current) use of anticoagulants: Secondary | ICD-10-CM

## 2022-03-18 DIAGNOSIS — D631 Anemia in chronic kidney disease: Secondary | ICD-10-CM | POA: Diagnosis not present

## 2022-03-18 DIAGNOSIS — I5032 Chronic diastolic (congestive) heart failure: Secondary | ICD-10-CM | POA: Diagnosis present

## 2022-03-18 DIAGNOSIS — I2510 Atherosclerotic heart disease of native coronary artery without angina pectoris: Secondary | ICD-10-CM | POA: Diagnosis present

## 2022-03-18 DIAGNOSIS — N2581 Secondary hyperparathyroidism of renal origin: Secondary | ICD-10-CM | POA: Diagnosis not present

## 2022-03-18 DIAGNOSIS — E871 Hypo-osmolality and hyponatremia: Secondary | ICD-10-CM | POA: Diagnosis not present

## 2022-03-18 DIAGNOSIS — R188 Other ascites: Secondary | ICD-10-CM | POA: Diagnosis present

## 2022-03-18 DIAGNOSIS — Z881 Allergy status to other antibiotic agents status: Secondary | ICD-10-CM

## 2022-03-18 DIAGNOSIS — N189 Chronic kidney disease, unspecified: Secondary | ICD-10-CM | POA: Diagnosis not present

## 2022-03-18 DIAGNOSIS — J90 Pleural effusion, not elsewhere classified: Secondary | ICD-10-CM | POA: Diagnosis not present

## 2022-03-18 DIAGNOSIS — N179 Acute kidney failure, unspecified: Secondary | ICD-10-CM | POA: Diagnosis not present

## 2022-03-18 DIAGNOSIS — N186 End stage renal disease: Secondary | ICD-10-CM | POA: Diagnosis present

## 2022-03-18 DIAGNOSIS — I953 Hypotension of hemodialysis: Secondary | ICD-10-CM | POA: Diagnosis present

## 2022-03-18 DIAGNOSIS — N250 Renal osteodystrophy: Secondary | ICD-10-CM | POA: Diagnosis not present

## 2022-03-18 DIAGNOSIS — Z8249 Family history of ischemic heart disease and other diseases of the circulatory system: Secondary | ICD-10-CM

## 2022-03-18 DIAGNOSIS — A419 Sepsis, unspecified organism: Principal | ICD-10-CM | POA: Diagnosis present

## 2022-03-18 DIAGNOSIS — R609 Edema, unspecified: Secondary | ICD-10-CM | POA: Diagnosis not present

## 2022-03-18 DIAGNOSIS — I132 Hypertensive heart and chronic kidney disease with heart failure and with stage 5 chronic kidney disease, or end stage renal disease: Secondary | ICD-10-CM | POA: Diagnosis present

## 2022-03-18 DIAGNOSIS — Z79899 Other long term (current) drug therapy: Secondary | ICD-10-CM

## 2022-03-18 DIAGNOSIS — K7460 Unspecified cirrhosis of liver: Secondary | ICD-10-CM | POA: Diagnosis not present

## 2022-03-18 DIAGNOSIS — D72829 Elevated white blood cell count, unspecified: Secondary | ICD-10-CM | POA: Diagnosis present

## 2022-03-18 DIAGNOSIS — R6521 Severe sepsis with septic shock: Secondary | ICD-10-CM | POA: Diagnosis present

## 2022-03-18 DIAGNOSIS — Z1152 Encounter for screening for COVID-19: Secondary | ICD-10-CM | POA: Diagnosis not present

## 2022-03-18 DIAGNOSIS — D6959 Other secondary thrombocytopenia: Secondary | ICD-10-CM | POA: Diagnosis not present

## 2022-03-18 DIAGNOSIS — Z90710 Acquired absence of both cervix and uterus: Secondary | ICD-10-CM

## 2022-03-18 DIAGNOSIS — Z992 Dependence on renal dialysis: Secondary | ICD-10-CM

## 2022-03-18 DIAGNOSIS — E8809 Other disorders of plasma-protein metabolism, not elsewhere classified: Secondary | ICD-10-CM | POA: Diagnosis present

## 2022-03-18 DIAGNOSIS — K652 Spontaneous bacterial peritonitis: Secondary | ICD-10-CM | POA: Diagnosis present

## 2022-03-18 DIAGNOSIS — Z66 Do not resuscitate: Secondary | ICD-10-CM | POA: Diagnosis present

## 2022-03-18 DIAGNOSIS — D509 Iron deficiency anemia, unspecified: Secondary | ICD-10-CM | POA: Diagnosis not present

## 2022-03-18 DIAGNOSIS — I959 Hypotension, unspecified: Secondary | ICD-10-CM | POA: Diagnosis not present

## 2022-03-18 DIAGNOSIS — Z823 Family history of stroke: Secondary | ICD-10-CM

## 2022-03-18 DIAGNOSIS — I252 Old myocardial infarction: Secondary | ICD-10-CM

## 2022-03-18 DIAGNOSIS — Z880 Allergy status to penicillin: Secondary | ICD-10-CM

## 2022-03-18 DIAGNOSIS — B954 Other streptococcus as the cause of diseases classified elsewhere: Secondary | ICD-10-CM | POA: Diagnosis present

## 2022-03-18 DIAGNOSIS — Z955 Presence of coronary angioplasty implant and graft: Secondary | ICD-10-CM

## 2022-03-18 DIAGNOSIS — Z882 Allergy status to sulfonamides status: Secondary | ICD-10-CM

## 2022-03-18 DIAGNOSIS — D649 Anemia, unspecified: Secondary | ICD-10-CM | POA: Diagnosis not present

## 2022-03-18 DIAGNOSIS — I480 Paroxysmal atrial fibrillation: Secondary | ICD-10-CM | POA: Diagnosis present

## 2022-03-18 DIAGNOSIS — R001 Bradycardia, unspecified: Secondary | ICD-10-CM | POA: Diagnosis not present

## 2022-03-18 LAB — COMPREHENSIVE METABOLIC PANEL
ALT: 68 U/L — ABNORMAL HIGH (ref 0–44)
AST: 197 U/L — ABNORMAL HIGH (ref 15–41)
Albumin: 1.7 g/dL — ABNORMAL LOW (ref 3.5–5.0)
Alkaline Phosphatase: 182 U/L — ABNORMAL HIGH (ref 38–126)
Anion gap: 12 (ref 5–15)
BUN: 18 mg/dL (ref 8–23)
CO2: 20 mmol/L — ABNORMAL LOW (ref 22–32)
Calcium: 8.8 mg/dL — ABNORMAL LOW (ref 8.9–10.3)
Chloride: 100 mmol/L (ref 98–111)
Creatinine, Ser: 3.12 mg/dL — ABNORMAL HIGH (ref 0.44–1.00)
GFR, Estimated: 14 mL/min — ABNORMAL LOW (ref >60)
Glucose, Bld: 132 mg/dL — ABNORMAL HIGH (ref 70–99)
Potassium: 4.5 mmol/L (ref 3.5–5.1)
Sodium: 132 mmol/L — ABNORMAL LOW (ref 135–145)
Total Bilirubin: 2.9 mg/dL — ABNORMAL HIGH (ref 0.3–1.2)
Total Protein: 4.7 g/dL — ABNORMAL LOW (ref 6.5–8.1)

## 2022-03-18 LAB — BLOOD GAS, ARTERIAL
Acid-base deficit: 4.5 mmol/L — ABNORMAL HIGH (ref 0.0–2.0)
Bicarbonate: 19 mmol/L — ABNORMAL LOW (ref 20.0–28.0)
O2 Saturation: 95.5 %
Patient temperature: 37
pCO2 arterial: 30 mmHg — ABNORMAL LOW (ref 32–48)
pH, Arterial: 7.41 (ref 7.35–7.45)
pO2, Arterial: 69 mmHg — ABNORMAL LOW (ref 83–108)

## 2022-03-18 LAB — CBC WITH DIFFERENTIAL/PLATELET
Abs Immature Granulocytes: 0.61 10*3/uL — ABNORMAL HIGH (ref 0.00–0.07)
Basophils Absolute: 0.1 10*3/uL (ref 0.0–0.1)
Basophils Relative: 0 %
Eosinophils Absolute: 0.1 10*3/uL (ref 0.0–0.5)
Eosinophils Relative: 0 %
HCT: 31.1 % — ABNORMAL LOW (ref 36.0–46.0)
Hemoglobin: 10.6 g/dL — ABNORMAL LOW (ref 12.0–15.0)
Immature Granulocytes: 3 %
Lymphocytes Relative: 10 %
Lymphs Abs: 2.2 10*3/uL (ref 0.7–4.0)
MCH: 33.1 pg (ref 26.0–34.0)
MCHC: 34.1 g/dL (ref 30.0–36.0)
MCV: 97.2 fL (ref 80.0–100.0)
Monocytes Absolute: 1.1 10*3/uL — ABNORMAL HIGH (ref 0.1–1.0)
Monocytes Relative: 5 %
Neutro Abs: 18.6 10*3/uL — ABNORMAL HIGH (ref 1.7–7.7)
Neutrophils Relative %: 82 %
Platelets: 42 10*3/uL — ABNORMAL LOW (ref 150–400)
RBC: 3.2 MIL/uL — ABNORMAL LOW (ref 3.87–5.11)
RDW: 20.3 % — ABNORMAL HIGH (ref 11.5–15.5)
WBC: 22.5 10*3/uL — ABNORMAL HIGH (ref 4.0–10.5)
nRBC: 1 % — ABNORMAL HIGH (ref 0.0–0.2)

## 2022-03-18 LAB — BODY FLUID CELL COUNT WITH DIFFERENTIAL
Lymphs, Fluid: 22 %
Monocyte-Macrophage-Serous Fluid: 19 %
Neutrophil Count, Fluid: 59 %
Total Nucleated Cell Count, Fluid: 57 cu mm

## 2022-03-18 LAB — PROCALCITONIN: Procalcitonin: 0.54 ng/mL

## 2022-03-18 LAB — MRSA NEXT GEN BY PCR, NASAL: MRSA by PCR Next Gen: NOT DETECTED

## 2022-03-18 LAB — PROTIME-INR
INR: 2 — ABNORMAL HIGH (ref 0.8–1.2)
Prothrombin Time: 22.3 seconds — ABNORMAL HIGH (ref 11.4–15.2)

## 2022-03-18 LAB — TROPONIN I (HIGH SENSITIVITY): Troponin I (High Sensitivity): 33 ng/L — ABNORMAL HIGH (ref <18)

## 2022-03-18 LAB — LACTIC ACID, PLASMA
Lactic Acid, Venous: 5.1 mmol/L (ref 0.5–1.9)
Lactic Acid, Venous: 5.7 mmol/L (ref 0.5–1.9)

## 2022-03-18 LAB — AMMONIA: Ammonia: 64 umol/L — ABNORMAL HIGH (ref 9–35)

## 2022-03-18 LAB — GLUCOSE, CAPILLARY: Glucose-Capillary: 132 mg/dL — ABNORMAL HIGH (ref 70–99)

## 2022-03-18 LAB — BRAIN NATRIURETIC PEPTIDE: B Natriuretic Peptide: 206.2 pg/mL — ABNORMAL HIGH (ref 0.0–100.0)

## 2022-03-18 LAB — SARS CORONAVIRUS 2 BY RT PCR: SARS Coronavirus 2 by RT PCR: NEGATIVE

## 2022-03-18 MED ORDER — SODIUM CHLORIDE 0.9 % IV SOLN: 2.0000 g | INTRAVENOUS | Status: DC

## 2022-03-18 MED ORDER — SODIUM CHLORIDE 0.9 % IV SOLN
2.0000 g | Freq: Once | INTRAVENOUS | Status: DC
  Filled 2022-03-18 (×2): qty 12.5

## 2022-03-18 MED ORDER — AMIODARONE HCL 200 MG PO TABS
200.0000 mg | ORAL_TABLET | Freq: Every day | ORAL | Status: DC
  Administered 2022-03-18: 200 mg via ORAL
  Filled 2022-03-18: qty 1

## 2022-03-18 MED ORDER — ONDANSETRON HCL 4 MG/2ML IJ SOLN: 4.0000 mg | Freq: Four times a day (QID) | INTRAMUSCULAR | Status: DC | PRN

## 2022-03-18 MED ORDER — VANCOMYCIN HCL 2000 MG/400ML IV SOLN
2000.0000 mg | Freq: Once | INTRAVENOUS | Status: AC
  Administered 2022-03-18: 2000 mg via INTRAVENOUS
  Filled 2022-03-18: qty 400

## 2022-03-18 MED ORDER — HYDROCORTISONE SOD SUC (PF) 100 MG IJ SOLR
50.0000 mg | Freq: Three times a day (TID) | INTRAMUSCULAR | Status: DC
  Administered 2022-03-18 – 2022-03-19 (×2): 50 mg via INTRAVENOUS
  Filled 2022-03-18 (×2): qty 2

## 2022-03-18 MED ORDER — SODIUM CHLORIDE 0.9 % IV BOLUS
500.0000 mL | Freq: Once | INTRAVENOUS | Status: AC
  Administered 2022-03-18: 500 mL via INTRAVENOUS

## 2022-03-18 MED ORDER — MIDODRINE HCL 5 MG PO TABS
10.0000 mg | ORAL_TABLET | Freq: Three times a day (TID) | ORAL | Status: DC
  Filled 2022-03-18: qty 2

## 2022-03-18 MED ORDER — HEPARIN SODIUM (PORCINE) 5000 UNIT/ML IJ SOLN
5000.0000 [IU] | Freq: Three times a day (TID) | INTRAMUSCULAR | Status: DC
  Administered 2022-03-18: 5000 [IU] via SUBCUTANEOUS
  Filled 2022-03-18 (×2): qty 1

## 2022-03-18 MED ORDER — POLYETHYLENE GLYCOL 3350 17 G PO PACK: 17.0000 g | PACK | Freq: Every day | ORAL | Status: DC | PRN

## 2022-03-18 MED ORDER — SODIUM CHLORIDE 0.9 % IV SOLN
2.0000 g | Freq: Once | INTRAVENOUS | Status: AC
  Administered 2022-03-18: 2 g via INTRAVENOUS
  Filled 2022-03-18: qty 2

## 2022-03-18 MED ORDER — NOREPINEPHRINE 4 MG/250ML-% IV SOLN
0.0000 ug/min | INTRAVENOUS | Status: DC
  Administered 2022-03-18: 20 ug/min via INTRAVENOUS
  Administered 2022-03-18: 24 ug/min via INTRAVENOUS
  Filled 2022-03-18 (×2): qty 250

## 2022-03-18 MED ORDER — RIFAXIMIN 550 MG PO TABS
550.0000 mg | ORAL_TABLET | Freq: Two times a day (BID) | ORAL | Status: DC
  Administered 2022-03-18: 550 mg via ORAL
  Filled 2022-03-18: qty 1

## 2022-03-18 MED ORDER — PANTOPRAZOLE SODIUM 40 MG PO TBEC
40.0000 mg | DELAYED_RELEASE_TABLET | Freq: Every day | ORAL | Status: DC
  Administered 2022-03-18: 40 mg via ORAL
  Filled 2022-03-18 (×2): qty 1

## 2022-03-18 MED ORDER — NOREPINEPHRINE 4 MG/250ML-% IV SOLN: 0.0000 ug/min | INTRAVENOUS | Status: DC

## 2022-03-18 MED ORDER — DOCUSATE SODIUM 100 MG PO CAPS: 100.0000 mg | ORAL_CAPSULE | Freq: Two times a day (BID) | ORAL | Status: DC | PRN

## 2022-03-18 MED ORDER — METRONIDAZOLE 500 MG/100ML IV SOLN
500.0000 mg | Freq: Once | INTRAVENOUS | Status: AC
  Administered 2022-03-18: 500 mg via INTRAVENOUS
  Filled 2022-03-18: qty 100

## 2022-03-18 MED ORDER — SODIUM CHLORIDE 0.9 % IV SOLN: 250.0000 mL | INTRAVENOUS | Status: DC

## 2022-03-18 MED ORDER — ALBUMIN HUMAN 25 % IV SOLN
25.0000 g | Freq: Once | INTRAVENOUS | Status: AC
  Administered 2022-03-19: 25 g via INTRAVENOUS
  Filled 2022-03-18: qty 100

## 2022-03-18 MED ORDER — CHLORHEXIDINE GLUCONATE CLOTH 2 % EX PADS: 6.0000 | MEDICATED_PAD | Freq: Every day | CUTANEOUS | Status: DC

## 2022-03-18 MED ORDER — NOREPINEPHRINE 4 MG/250ML-% IV SOLN
2.0000 ug/min | INTRAVENOUS | Status: DC
  Administered 2022-03-18: 5 ug/min via INTRAVENOUS

## 2022-03-18 MED ORDER — LIDOCAINE-EPINEPHRINE 2 %-1:100000 IJ SOLN
20.0000 mL | Freq: Once | INTRAMUSCULAR | Status: AC
  Administered 2022-03-18: 20 mL
  Filled 2022-03-18: qty 1

## 2022-03-18 MED ORDER — NOREPINEPHRINE 16 MG/250ML-% IV SOLN
0.0000 ug/min | INTRAVENOUS | Status: DC
  Administered 2022-03-19: 40 ug/min via INTRAVENOUS
  Administered 2022-03-19: 30 ug/min via INTRAVENOUS
  Filled 2022-03-18: qty 250

## 2022-03-18 MED ORDER — FAMOTIDINE 20 MG PO TABS: 20.0000 mg | ORAL_TABLET | Freq: Two times a day (BID) | ORAL | Status: DC

## 2022-03-18 MED ORDER — VANCOMYCIN HCL 2000 MG/400ML IV SOLN
2000.0000 mg | Freq: Once | INTRAVENOUS | Status: DC
  Filled 2022-03-18 (×2): qty 400

## 2022-03-18 MED ORDER — NOREPINEPHRINE 4 MG/250ML-% IV SOLN
INTRAVENOUS | Status: AC
  Administered 2022-03-18: 2 ug/min via INTRAVENOUS
  Filled 2022-03-18: qty 250

## 2022-03-18 NOTE — ED Notes (Signed)
MD at bedside to attempt IV [place] - IV team unable to obtain IV

## 2022-03-18 NOTE — ED Provider Notes (Signed)
[facility] Provider Note    Event Date/Time   First MD Initiated Contact with Patient 03/10/2022 1232     (approximate)   History   Hypotension   HPI  April [name] is a 81 y.o. female with pmh ESRD on HD TTS with right chest permacath, anemia of CKD/secondary hyperparathyroidism, CAD s/p angioplasty, diabetes mellitus, PAF on Eliquis, sleep apnea, diastolic heart failure and April [name] cirrhosis who presents because of hypotension.  Patient resides at a skilled nursing facility.  Was getting dialysis when she became progressively hypotensive.  Apparently last dialysis which was on Thursday they were also not able to remove fluid because of low blood pressure.  Patient says she just feels weak and has been weak for some time but is worse today denies shortness of breath belly pain denies cough fevers chills.  She does not make urine.     Past Medical History:  Diagnosis Date   Anemia    CAD S/P PCI pRCA Promus DES 3.5 x 16 (4.1 mm), ostRPDA Promus DES 2.5 x 12 (2.7 mm) 03/25/2016   CAD S/P percutaneous coronary angioplasty    a. 07/2015 MV: No ischemia, EF 66%;  b. 03/2016 Inflat STEMI/PCI: LM nl, LAD 25d, RI nl, LCX nl, OM1/2 nl, RCA 80p (3.5x16 Promus Premier DES), 50p/m, 30d, RPDA 90 (2.5x12 Promu Premier DES); c. 01/2018 Cath ([place], [place]): Patent RCA stents->Med Rx.   CHF (congestive heart failure) ([place])    CKD (chronic kidney disease), stage III ([place])    Diabetes mellitus without complication ([place])    Diastolic dysfunction    a. 10/2013 Echo: EF 55-65%, Gr1 DD; b. 01/2018 Echo ([place], [place]): EF 55-60%, Gr2 DD, RVSP 60mHg.   Diverticulitis    Dyspnea    Endometriosis    Family history of adverse reaction to anesthesia    Mother - had to stay in ICU because of breathing difficulties every time she had anesthesia   GERD (gastroesophageal reflux disease)    GI bleed    a. 06/2021 - melena-->2 u PRBCs.   Hiatal hernia     History of colon polyps 08/1994   History of Migraines    Hypercholesterolemia    Hypertension    LBBB (left bundle branch block)    Osteoporosis    PAF (paroxysmal atrial fibrillation) ([place]    a. 03/2016 Dx @ time of MI; b. CHA2DS2VASc = 5-->Eliquis.   Rheumatic fever    Sleep apnea    CPAP   Spasmodic dysphonia    ST elevation myocardial infarction (STEMI) of inferolateral wall, initial episode of care ([place] 03/25/2016   ST elevation myocardial infarction (STEMI) of inferolateral wall, initial episode of care (HH[place] 03/25/2016   Urine incontinence     Patient Active Problem List   Diagnosis Date Noted   Subdural hemorrhage ([place] 03/03/2022   End-stage renal disease on hemodialysis ([place] 03/03/2022   GERD without esophagitis 03/03/2022   Encephalopathy 03/03/2022   Melena    Pressure injury of skin 02/05/2022   Shock ([facility]    Severe sepsis ([facility] 02/01/2022   Anasarca 02/01/2022   Prolonged QT interval 02/01/2022   ESRD on dialysis (HB[place] 01/09/2022   HTN (hypertension) 01/09/2022   COPD exacerbation ([place] 01/09/2022   Sepsis ([facility] 01/09/2022   Anemia in ESRD (end-stage renal disease) ([place] 01/09/2022   Thrombocytopenia (HB[place] Hot [place] 070/17/7939  Acute metabolic encephalopathy 003/00/9233  Acute hepatic encephalopathy ([place] 12/09/2021   Cirrhosis of liver  with ascites ([place]) 12/09/2021   Edema and ascites  12/08/2021   Hyponatremia 12/07/2021   Anemia of chronic kidney failure, stage 4 (severe) ([place]) 12/07/2021   Weakness 10/29/2021   Acute respiratory failure with hypoxia ([place]) 10/18/2021   History of recurrent GI bleed on apixaban 10/17/2021   History of adverse reaction to anticoagulant medication 10/17/2021   Electrolyte abnormality 10/17/2021   Hypokalemia 08/11/2021   Chronic diastolic CHF (congestive heart failure) ([place]) 08/08/2021   Chronic obstructive pulmonary disease, unspecified COPD type ([place]) 08/01/2021   Iron deficiency anemia    Symptomatic anemia    Upper GI  bleed 07/07/2021   Thrombophilia ([place]) 04/10/2021   Aortic atherosclerosis ([place]) 01/03/2021   Cellulitis of leg, left 10/26/2020   Breast tenderness 07/14/2020   Abnormal liver function tests 03/11/2020   Lymphedema 07/09/2018   Acute renal failure superimposed on stage 4 chronic kidney disease ([place]) 06/03/2018   Lower extremity edema 01/19/2018   Arthropathy of lumbar facet joint 08/08/2017   Degeneration of lumbar intervertebral disc 08/08/2017   Osteoarthritis of knee 08/08/2017   Paroxysmal atrial fibrillation ([place]) 03/27/2016   CAD S/P PCI pRCA Promus DES 3.5 x 16 (4.1 mm), ostRPDA Promus DES 2.5 x 12 (2.7 mm) 03/25/2016   Coronary artery disease involving native coronary artery of native heart with unstable angina pectoris ([place]) 06/01/2015   Diabetes mellitus ([place]) 05/16/2015   Gastritis 04/01/2015   Chest pain 04/01/2015   Hiatal hernia 08/25/2014   DOE (dyspnea on exertion) 07/15/2014   Congestion of throat 07/06/2014   Health care maintenance 07/06/2014   Fibrocystic breast disease 07/06/2014   Morbid obesity ([place]) 04/19/2014   Stress 02/16/2013   Rib pain on right side 02/16/2013   Acute on Chronic dyspnea 10/13/2012   GERD (gastroesophageal reflux disease) 10/13/2012   Essential hypertension, benign 08/18/2012   Hyperlipidemia 08/18/2012   History of colonic polyps 08/18/2012   OSA on CPAP 08/18/2012   Osteoporosis 08/18/2012     Physical Exam  Triage Vital Signs: ED Triage Vitals  Enc Vitals Group     BP 03/11/2022 1216 (!) 72/46     Pulse Rate 03/15/2022 1216 83     Resp 03/27/2022 1216 18     Temp 03/10/2022 1230 98 F (36.7 C)     Temp Source 03/04/2022 1230 Oral     SpO2 03/15/2022 1216 100 %     Weight 03/12/2022 1214 188 lb 11.4 oz (85.6 kg)     Height --      Head Circumference --      Peak Flow --      Pain Score 03/25/2022 1214 0     Pain Loc --      Pain Edu? --      Excl. in [place]? --     Most recent vital signs: Vitals:   03/05/2022 1430 03/17/2022 1435   BP: (!) 47/34 (!) 62/32  Pulse: 81 85  Resp: (!) 24 17  Temp:    SpO2: 99% 100%     General: Awake, chronically ill-appearing CV:  Good peripheral perfusion.  2+ pitting edema bilateral lower extremities Resp:  Normal effort.  No increased work of breathing decreased breath sounds Abd:  Abdomen is distended but is soft Neuro:             Awake, Alert, Oriented x 3  Other:  Patient moves all extremities spontaneously   ED Results / Procedures / Treatments  Labs (all labs ordered are listed, but only abnormal  results are displayed) Labs Reviewed  COMPREHENSIVE METABOLIC PANEL - Abnormal; Notable for the following components:      Result Value   Sodium 132 (*)    CO2 20 (*)    Glucose, Bld 132 (*)    Creatinine, Ser 3.12 (*)    Calcium 8.8 (*)    Total Protein 4.7 (*)    Albumin 1.7 (*)    AST 197 (*)    ALT 68 (*)    Alkaline Phosphatase 182 (*)    Total Bilirubin 2.9 (*)    GFR, Estimated 14 (*)    All other components within normal limits  LACTIC ACID, PLASMA - Abnormal; Notable for the following components:   Lactic Acid, Venous 5.1 (*)    All other components within normal limits  CBC WITH DIFFERENTIAL/PLATELET - Abnormal; Notable for the following components:   WBC 22.5 (*)    RBC 3.20 (*)    Hemoglobin 10.6 (*)    HCT 31.1 (*)    RDW 20.3 (*)    Platelets 42 (*)    nRBC 1.0 (*)    Neutro Abs 18.6 (*)    Monocytes Absolute 1.1 (*)    Abs Immature Granulocytes 0.61 (*)    All other components within normal limits  PROTIME-INR - Abnormal; Notable for the following components:   Prothrombin Time 22.3 (*)    INR 2.0 (*)    All other components within normal limits  BRAIN NATRIURETIC PEPTIDE - Abnormal; Notable for the following components:   B Natriuretic Peptide 206.2 (*)    All other components within normal limits  TROPONIN I (HIGH SENSITIVITY) - Abnormal; Notable for the following components:   Troponin I (High Sensitivity) 33 (*)    All other  components within normal limits  CULTURE, BLOOD (ROUTINE X 2)  CULTURE, BLOOD (ROUTINE X 2)  SARS CORONAVIRUS 2 BY RT PCR  BODY FLUID CULTURE W GRAM STAIN  URINALYSIS, ROUTINE W REFLEX MICROSCOPIC  LACTIC ACID, PLASMA  BODY FLUID CELL COUNT WITH DIFFERENTIAL  PROTEIN, BODY FLUID (OTHER)  GLUCOSE, BODY FLUID OTHER            CBG MONITORING, ED     EKG   EKG is a left bundle normal sinus rhythm with first-degree AV block  RADIOLOGY Reviewed and interpreted patient's chest x-ray shows shows large left-sided pleural effusion   PROCEDURES:  Critical Care performed: Yes, see critical care procedure note(s)  .Paracentesis  Date/Time: 03/07/2022 1:45 PM  Performed by: Rada Hay, MD Authorized by: Rada Hay, MD   Consent:    Consent obtained:  Verbal   Consent given by:  Patient   Risks discussed:  Bleeding, pain and infection   Alternatives discussed:  No treatment Universal protocol:    Patient identity confirmed:  Verbally with patient Pre-procedure details:    Procedure purpose:  Diagnostic Anesthesia:    Anesthesia method:  Local infiltration   Local anesthetic:  Lidocaine 1% WITH epi Procedure details:    Needle gauge:  18   Ultrasound guidance: yes     Puncture site:  R lower quadrant   Fluid removed amount:  20   Fluid appearance:  Amber   Dressing:  4x4 sterile gauze Post-procedure details:    Procedure completion:  Tolerated Comments:     Initially attempted with 18-gauge needle but was too short for patient's abdominal wall so I next used a 18-gauge ultrasound needle and was able to aspirate fluid and confirmed correct location  on ultrasound   The patient is on the cardiac monitor to evaluate for evidence of arrhythmia and/or significant heart rate changes.   MEDICATIONS ORDERED IN ED: Medications  norepinephrine (LEVOPHED) 59m in 2557m(0.016 mg/mL) premix infusion (0 mcg/min Intravenous Paused 03/27/2022 1444)  ceFEPIme (MAXIPIME) 2  g in sodium chloride 0.9 % 100 mL IVPB (has no administration in time range)  vancomycin (VANCOREADY) IVPB 2000 mg/400 mL (has no administration in time range)  lidocaine-EPINEPHrine (XYLOCAINE W/EPI) 2 %-1:100000 (with pres) injection 20 mL (20 mLs Infiltration Given by Other 03/11/2022 1316)  metroNIDAZOLE (FLAGYL) IVPB 500 mg (0 mg Intravenous Stopped 03/15/2022 1444)     IMPRESSION / MDM / ASE[place] ED COURSE  I reviewed the triage vital signs and the nursing notes.                              Patient's presentation is most consistent with acute presentation with potential threat to life or bodily function.  Differential diagnosis includes, but is not limited to, sepsis hepatorenal syndrome SBP, pneumonia, anemia  The patient is a [age]ear old female with cirrhosis and end-stage renal disease who presents because of hypotension.  Apparently she has had weight gain but they have been unable to draw fluid off at dialysis because of low blood pressures.  When EMS arrived today blood pressures were in the 6086Vystolic.  On arrival to the ED she 68s over 4067sentating okay looks chronically ill and looks very edematous with anasarca.  Patient was difficult access but I was able to obtain an IV peripherally using ultrasound.  Ultrasound of her abdomen she does have ascites ultrasound of the heart does not reveal any large pericardial effusion.  Chest x-ray shows a left pleural effusion.  Labs are notable for leukocytosis of 23 lactate of 5 she has elevated LFTs with T. bili 2.9 alk phos 180 AST and ALT 270 respectively very low albumin of 1.7.  Electrolytes are okay.  INR is 2.  Troponin mildly elevated at 33 EKG showing left bundle without Sgarbossa criteria to suggest ischemia.  I did perform a diagnostic paracentesis.  Patient does not make urine.  Will cover broadly with cefepime bank Flagyl.  I did not start fluids as patient is clinically volume overloaded and I am concerned about her  respiratory status.  Did start on peripheral levo.  We will discussed with the intensivist.       FINAL CLINICAL IMPRESSION(S) / ED DIAGNOSES   Final diagnoses:  Shock ([place] Pleural effusion  Other ascites     Rx / DC Orders   ED Discharge Orders     None        Note:  This document was prepared using Dragon voice recognition software and may include unintentional dictation errors.   McRada HayMD 03/16/2022 [phone]

## 2022-03-18 NOTE — ED Notes (Signed)
Due to BP not rising as expected, IV was assessed and found to be out of the vein.  EDP made aware.

## 2022-03-18 NOTE — ED Notes (Signed)
RN only able to obtain 1 set of blood cultures. Lab called to obtain 2nd set

## 2022-03-18 NOTE — Progress Notes (Signed)
Pharmacy Antibiotic Note  April [name] is a 81 y.o. female admitted on 03/20/2022 with sepsis.  Pharmacy has been consulted for vancomycin dosing.  Renal function unstable (Scr 3.12; BL 2.5)  Plan: Vancomycin 2,000 mg x 1 ordered Will dose by levels due to unstable renal function. Vanc random ordered for 11/19 '@2000'$   Also on cefepime 2 grams every 24 hours per renal function   Follow up renal function in AM, MRSA PCR, clinical course.  Weight: 85.6 kg (188 lb 11.4 oz)  Temp (24hrs), Avg:98 F (36.7 C), Min:98 F (36.7 C), Max:98 F (36.7 C)  Recent Labs  Lab 03/03/2022 1217 03/20/2022 1219 03/23/2022 1500  WBC 22.5*  --   --   CREATININE 3.12*  --   --   LATICACIDVEN  --  5.1* 5.7*    Estimated Creatinine Clearance: 12.4 mL/min (A) (by C-G formula based on SCr of 3.12 mg/dL (H)).    Allergies  Allergen Reactions   Penicillins Other (See Comments)    Okay to take amoxicillin/(pt does not recall what the reaction to penicillin was (37-60 years old)    Sulfa Antibiotics Rash    Antimicrobials this admission: vancomycin 11/18 >>  cefepime 11/18 >>   Dose adjustments this admission: N/a  Microbiology results: 11/18 BCx: in process 11/18 Peritoneal body fluid: in process 11/18 MRSA PCR: ordered  Thank you for allowing pharmacy to be a part of this patient's care.  Wynelle Cleveland 03/10/2022 5:02 PM

## 2022-03-18 NOTE — ED Notes (Signed)
X-ray at bedside

## 2022-03-18 NOTE — Progress Notes (Signed)
Callled to start 2nd PIV site.Used U/S, assessed both arms.. Both arms edematous. Unable to find a suitable vein to attempt. Veins tiny, will not compress. GFR 12. Recommend Central line placement

## 2022-03-18 NOTE — H&P (Signed)
Pulmonary and Critical care    NAME:  April [name], MRN:  [id number], DOB:  1940-11-26, LOS: 0 ADMISSION DATE:  03/14/2022,     History of Present Illness:  The patient is a 81 year old lady with past medical history of liver cirrhosis.  Coronary artery disease with prior history of PCI in 2017, congestive heart failure chronic kidney disease on dialysis.  Diabetes diastolic heart failure.  The patient is currently at skilled rehabilitation she was brought to the ER from dialysis secondary to low blood pressure.  The patient had 1 L fluid removed and blood pressure was 66/27.  Patient has also been progressively noted to gain weight of about 6 kg and has been noticing worsening lower extremity edema and abdominal and upper extremity edema.  She reports to me that she has been nauseous and has had vomiting however denies abdominal pain denies chest pain denies fever notices being cold denies chest pain denies heartburn denies any focal logic weakness and has been feeling tired.  The patient has a right-sided chest permacath as dialysis access.   ED Course: In the ER patient was noted to have blood pressure 47/34 pulse 81 respiration 24.  O2 saturation 99% on room air.  Sodium 132 creatinine 3.12 AST 197 ALT 68 total bilirubin 2.9.  Lactic acid 5.1.  WBC count 22,000 platelet count 42.  Patient's son is present in the ER and confirmed DNR.  Vancomycin and cefepime was ordered in the ER including blood cultures and urinalysis as well as COVID testing.  Patient also had diagnostic paracentesis performed in the ER.  Also albumin was discussed to be ordered in the ER. X-ray shows bilateral pleural effusion.         03/07/2022    4:30 PM 03/10/2022    4:15 PM 03/30/2022    4:00 PM  Vitals with BMI  Systolic 71 74 64  Diastolic 26 42 49  Pulse 85 84 84        Examination: General: Awake, no acute distress. HENT: Mild icterus and pallor Lungs: He was heard bilaterally diminished  posteriorly resonant to percussion anteriorly Cardiovascular: S1-S2 no murmurs. Abdomen: Nontender bowel sounds were heard.  No rebound or guarding. Extremities: 2-3+ edema in lower extremities pulses palpable Neuro: And oriented to place and person.  Moving all extremities to commands     Assessment & Plan:  1.  Septic shock source unclear 2.  Decompensated liver cirrhosis with ascites post paracentesis in the ER 3.  Bilateral pleural effusion 4.  Stage renal disease 5.  DNR 6.  ACute on chronic thrombocytopenia secondary to sepsis 7.  Anasarca 8.  Hyponatremia 9.  Chronic diastolic heart failure 10.  Coronary artery disease 11.  Metabolic acidosis PLAN 1.  Blood cultures were sent in the ER including urinalysis with reflex 2.  Empiric antibiotics with vancomycin and cefepime 3.  Albumin transfusion 4.  Patient is currently on Levophed will require central access. 5.  Recheck lactic acid 6.  Follow results from diagnostic paracentesis. 7.  Nephrology consultation Best Practice (right click and "Reselect all SmartList Selections" daily)   Diet/type: Regular consistency (see orders) DVT prophylaxis: prophylactic heparin  GI prophylaxis: H2B Lines: Dialysis Catheter Foley:  N/A Code Status:  DNR   Labs   CBC: Recent Labs  Lab 03/08/2022 1217  WBC 22.5*  NEUTROABS 18.6*  HGB 10.6*  HCT 31.1*  MCV 97.2  PLT 42*    Basic Metabolic Panel: Recent Labs  Lab 03/06/2022 1217  NA 132*  K 4.5  CL 100  CO2 20*  GLUCOSE 132*  BUN 18  CREATININE 3.12*  CALCIUM 8.8*   GFR: Estimated Creatinine Clearance: 12.4 mL/min (A) (by C-G formula based on SCr of 3.12 mg/dL (H)). Recent Labs  Lab 03/07/2022 1217 03/15/2022 1219 03/08/2022 1500  WBC 22.5*  --   --   LATICACIDVEN  --  5.1* 5.7*    Liver Function Tests: Recent Labs  Lab 03/29/2022 1217  AST 197*  ALT 68*  ALKPHOS 182*  BILITOT 2.9*  PROT 4.7*  ALBUMIN 1.7*   No results for input(s): "LIPASE", "AMYLASE" in  the last 168 hours. No results for input(s): "AMMONIA" in the last 168 hours.  ABG    Component Value Date/Time   PHART 7.6 (H) 10/18/2021 0313   PCO2ART 47 10/18/2021 0313   PO2ART 78 (L) 10/18/2021 0313   HCO3 46.1 (H) 10/18/2021 0313   TCO2 25 03/25/2016 1927   O2SAT 97.4 10/18/2021 0313     Coagulation Profile: Recent Labs  Lab 03/11/2022 1217  INR 2.0*    Cardiac Enzymes: No results for input(s): "CKTOTAL", "CKMB", "CKMBINDEX", "TROPONINI" in the last 168 hours.  HbA1C: Hgb A1c MFr Bld  Date/Time Value Ref Range Status  09/21/2021 01:33 PM 6.3 4.6 - 6.5 % Final    Comment:    Glycemic Control Guidelines for People with Diabetes:Non Diabetic:  <6%Goal of Therapy: <7%Additional Action Suggested:  >8%   01/20/2021 09:20 AM 6.2 4.6 - 6.5 % Final    Comment:    Glycemic Control Guidelines for People with Diabetes:Non Diabetic:  <6%Goal of Therapy: <7%Additional Action Suggested:  >8%     CBG: No results for input(s): "GLUCAP" in the last 168 hours.  RADIOLOGY X-ray images as indicated above.  Review of Systems:   Positives in BOLD: Gen: Denies fever, chills, weight change, fatigue, night sweats HEENT: Denies blurred vision, double vision, hearing loss, tinnitus, sinus congestion, rhinorrhea, sore throat, neck stiffness, dysphagia PULM: Denies shortness of breath, cough, sputum production, hemoptysis, wheezing CV: Denies chest pain, edema, orthopnea, paroxysmal nocturnal dyspnea, palpitations GI: Denies abdominal pain, nausea and vomiting reported, diarrhea, hematochezia, melena, constipation, change in bowel habits GU: Denies dysuria, hematuria, polyuria, oliguria, urethral discharge Endocrine: Denies hot or cold intolerance, polyuria, polyphagia or appetite change Derm: Denies rash, dry skin, scaling or peeling skin change Heme: Denies easy bruising, bleeding, bleeding gums Neuro: Denies headache, numbness, weakness, slurred speech, loss of memory or  consciousness   Past Medical History:  She,  has a past medical history of Anemia, CAD S/P PCI pRCA Promus DES 3.5 x 16 (4.1 mm), ostRPDA Promus DES 2.5 x 12 (2.7 mm) (03/25/2016), CAD S/P percutaneous coronary angioplasty, CHF (congestive heart failure) ([place]), CKD (chronic kidney disease), stage III ([place]), Diabetes mellitus without complication ([place]), Diastolic dysfunction, Diverticulitis, Dyspnea, Endometriosis, Family history of adverse reaction to anesthesia, GERD (gastroesophageal reflux disease), GI bleed, Hiatal hernia, History of colon polyps (08/1994), History of Migraines, Hypercholesterolemia, Hypertension, LBBB (left bundle branch block), Osteoporosis, PAF (paroxysmal atrial fibrillation) ([place]), Rheumatic fever, Sleep apnea, Spasmodic dysphonia, ST elevation myocardial infarction (STEMI) of inferolateral wall, initial episode of care ([place]) (03/25/2016), ST elevation myocardial infarction (STEMI) of inferolateral wall, initial episode of care ([place]) (03/25/2016), and Urine incontinence.   Surgical History:   Past Surgical History:  Procedure Laterality Date   ABDOMINAL HYSTERECTOMY     ovaries not removed   APPENDECTOMY     was removed during hysterectomy   Breast biopsies  x2   BREAST EXCISIONAL BIOPSY Bilateral "years ago"   neg   BREAST SURGERY Bilateral    BROW LIFT Bilateral 08/15/2019   Procedure: BLEPHAROPLASTY UPPER EYELID; W/EXCESS SKIN BLEPHAROPTOSIS REPAIR; RESECT EX;  Surgeon: Karle Starch, MD;  Location: [facility];  Service: Ophthalmology;  Laterality: Bilateral;  sleep apnea   CARDIAC CATHETERIZATION  2011   moderate 40% RCA disease   CARDIAC CATHETERIZATION  01/2010   Dr. Gollan'@[facility]'$ : Only noted 40% RCA   CARDIAC CATHETERIZATION N/A 03/25/2016   Procedure: Left Heart Cath and Coronary Angiography;  Surgeon: 04/07/2016, MD;  Location: [place] [facility];  Service: Cardiovascular;  Laterality: N/A;   CARDIAC CATHETERIZATION N/A 03/25/2016    Procedure: Coronary Stent Intervention;  Surgeon: 04/07/2016, MD;  Location: [place] [facility];  Service: Cardiovascular;  Laterality: N/A;   COLONOSCOPY  2013   COLONOSCOPY WITH PROPOFOL N/A 07/11/2021   Procedure: COLONOSCOPY WITH PROPOFOL;  Surgeon: 07/24/2021, MD;  Location: [facility] ENDOSCOPY;  Service: Endoscopy;  Laterality: N/A;   DIALYSIS/PERMA CATHETER INSERTION N/A 12/15/2021   Procedure: DIALYSIS/PERMA CATHETER INSERTION;  Surgeon: 12/28/2021, MD;  Location: [place] [facility];  Service: Cardiovascular;  Laterality: N/A;   ENTEROSCOPY N/A 02/11/2022   Procedure: ENTEROSCOPY;  Surgeon: [place], 02/24/2022, MD;  Location: [facility] ENDOSCOPY;  Service: Gastroenterology;  Laterality: N/A;   ESOPHAGOGASTRODUODENOSCOPY (EGD) WITH PROPOFOL N/A 03/17/2015   Procedure: ESOPHAGOGASTRODUODENOSCOPY (EGD) WITH PROPOFOL;  Surgeon: 03/30/2015, MD;  Location: [facility] ENDOSCOPY;  Service: Gastroenterology;  Laterality: N/A;   ESOPHAGOGASTRODUODENOSCOPY (EGD) WITH PROPOFOL N/A 07/10/2021   Procedure: ESOPHAGOGASTRODUODENOSCOPY (EGD) WITH PROPOFOL;  Surgeon: 16/03/2022, MD;  Location: [facility] ENDOSCOPY;  Service: Gastroenterology;  Laterality: N/A;   GIVENS CAPSULE STUDY N/A 07/11/2021   Procedure: GIVENS CAPSULE STUDY;  Surgeon: 07/24/2021, MD;  Location: [facility] ENDOSCOPY;  Service: Endoscopy;  Laterality: N/A;   NM MYOVIEW ([place] [facility]X)  07/2015   No evidence ischemia or infarction. EF 66%. Low risk   TEMPORARY DIALYSIS CATHETER N/A 12/08/2021   Procedure: TEMPORARY DIALYSIS CATHETER;  Surgeon: 21/01/2022, MD;  Location: [place] [facility];  Service: Cardiovascular;  Laterality: N/A;   TONSILLECTOMY     TRANSTHORACIC ECHOCARDIOGRAM  10/2013   Normal LV size and function. EF 55-65%. GR 1 DD. Otherwise normal.     Social History:   reports that she has never smoked. She has never used smokeless tobacco. She reports that she does not drink alcohol and does not use drugs.   Family  History:  Her family history includes Arthritis in her mother; Breast cancer in an other family member; Heart attack in her daughter, father, and sister; Heart disease in her father; Hypertension in her mother; Osteoporosis in her mother; Stroke in her brother, daughter, and mother. There is no history of Colon cancer.   Allergies Allergies  Allergen Reactions   Penicillins Other (See Comments)    Okay to take amoxicillin/(pt does not recall what the reaction to penicillin was (52-62 years old)    Sulfa Antibiotics Rash     Home Medications  Prior to Admission medications   Medication Sig Start Date End Date Taking? Authorizing Provider  acetaminophen (TYLENOL) 325 MG tablet Take 650 mg by mouth every 4 (four) hours as needed for mild pain or fever.   Yes [provider]  amiodarone (PACERONE) 200 MG tablet Take 1 tablet (200 mg total) by mouth daily. 12/20/21  Yes 01/02/22, MD  lactulose (  CHRONULAC) 10 GM/15ML solution Take 45 mLs (30 g total) by mouth 3 (three) times daily. 03/07/22  Yes Sharen Hones, MD  midodrine (PROAMATINE) 10 MG tablet Take 1 tablet (10 mg total) by mouth 3 (three) times daily with meals. 02/12/22 05/13/22 Yes Enzo Bi, MD  Multiple Vitamins-Minerals (MULTIVITAMIN WITH MINERALS) tablet Take 1 tablet by mouth daily.   Yes [provider]  nystatin (MYCOSTATIN) 100000 UNIT/ML suspension Take 10 mLs by mouth 4 (four) times daily.   Yes [provider]  pantoprazole (PROTONIX) 40 MG tablet Take 1 tablet (40 mg total) by mouth 2 (two) times daily before a meal. 02/12/22 05/13/22 Yes Enzo Bi, MD  rifaximin (XIFAXAN) 550 MG TABS tablet Take 1 tablet (550 mg total) by mouth 2 (two) times daily. 02/12/22 05/13/22 Yes Enzo Bi, MD  torsemide 40 MG TABS Take 80 mg by mouth daily. 02/12/22 05/13/22 Yes Enzo Bi, MD  epoetin alfa (EPOGEN) 10000 UNIT/ML injection Inject 0.8 mLs (8,000 Units total) into the vein Every Tuesday,Thursday,and Saturday with  dialysis. 02/14/22   Enzo Bi, MD  hydrocortisone (ANUSOL-HC) 25 MG suppository Place 25 mg rectally 2 (two) times daily. 02/24/22 03/20/22  [provider]  nitroGLYCERIN (NITROSTAT) 0.4 MG SL tablet Place 1 tablet (0.4 mg total) under the tongue every 5 (five) minutes as needed for chest pain. 12/19/21   Nolberto Hanlon, MD  simethicone (MYLICON) 80 MG chewable tablet Chew 1 tablet (80 mg total) by mouth 4 (four) times daily as needed for flatulence. 03/07/22   Sharen Hones, MD     Critical care time:  I have personally spent _________60_ minutes of critical care time, exclusive of time spent on any  procedures, in evaluation and management of this critically ill patient's condition.    Aryam Zhan MD ([place]) FOR [facility]

## 2022-03-18 NOTE — ED Triage Notes (Signed)
Pt to ED via emergency traffic from [facility] dialysis. [place] Pt had tx today and had 1L pulled off and BP dropped. BP 66/27 x3 readings with EMS. EMS states pt has had 6kg weight gain since last dialysis tx. HR 110-140s afib. Pulse ox on ear reading 99%. Pt A&Ox4. Pt reports she feels weak for a while now. Pt denies SOB or CP.

## 2022-03-18 NOTE — Procedures (Signed)
Central Venous Catheter Insertion Procedure Note LATONJA BOBECK [id number] 11/18/1940  Procedure: Insertion of Central Venous Catheter Indications: Assessment of intravascular volume  Procedure Details Consent: Risks of procedure as well as the alternatives and risks of each were explained to the (patient/caregiver).  Consent for procedure obtained. Time Out: Verified patient identification, verified procedure, site/side was marked, verified correct patient position, special equipment/implants available, medications/allergies/relevent history reviewed, required imaging and test results available.  Performed  Maximum sterile technique was used including antiseptics, cap, gloves, gown, hand hygiene, mask, and sheet. Skin prep: Chlorhexidine; local anesthetic administered A antimicrobial bonded/coated triple lumen catheter was placed in the left internal jugular vein using the Seldinger technique.  Evaluation Blood flow good Complications: No apparent complications Patient did tolerate procedure well. Chest X-ray ordered to verify placement.  CXR: pending.  Caroll Weinheimer 03/28/2022, 6:23 PM

## 2022-03-18 NOTE — ED Notes (Signed)
Pt cleaned and new linens in place

## 2022-03-19 ENCOUNTER — Encounter: Payer: Self-pay | Admitting: Emergency Medicine

## 2022-03-19 ENCOUNTER — Other Ambulatory Visit: Payer: Self-pay

## 2022-03-19 ENCOUNTER — Inpatient Hospital Stay
Admit: 2022-03-19 | Discharge: 2022-03-19 | Disposition: A | Discharge status: Home / Self Care | Payer: Medicare Other | Attending: Pulmonary Disease | Admitting: Pulmonary Disease

## 2022-03-19 ENCOUNTER — Inpatient Hospital Stay: Payer: Medicare Other

## 2022-03-19 DIAGNOSIS — A419 Sepsis, unspecified organism: Secondary | ICD-10-CM | POA: Diagnosis not present

## 2022-03-19 DIAGNOSIS — R6521 Severe sepsis with septic shock: Secondary | ICD-10-CM | POA: Diagnosis not present

## 2022-03-19 LAB — HEPATITIS B SURFACE ANTIGEN: Hepatitis B Surface Ag: NONREACTIVE

## 2022-03-19 LAB — COMPREHENSIVE METABOLIC PANEL
ALT: 72 U/L — ABNORMAL HIGH (ref 0–44)
AST: 253 U/L — ABNORMAL HIGH (ref 15–41)
Albumin: 2.4 g/dL — ABNORMAL LOW (ref 3.5–5.0)
Alkaline Phosphatase: 160 U/L — ABNORMAL HIGH (ref 38–126)
Anion gap: 15 (ref 5–15)
BUN: 23 mg/dL (ref 8–23)
CO2: 17 mmol/L — ABNORMAL LOW (ref 22–32)
Calcium: 9.3 mg/dL (ref 8.9–10.3)
Chloride: 101 mmol/L (ref 98–111)
Creatinine, Ser: 3.5 mg/dL — ABNORMAL HIGH (ref 0.44–1.00)
GFR, Estimated: 12 mL/min — ABNORMAL LOW (ref >60)
Glucose, Bld: 142 mg/dL — ABNORMAL HIGH (ref 70–99)
Potassium: 3.8 mmol/L (ref 3.5–5.1)
Sodium: 133 mmol/L — ABNORMAL LOW (ref 135–145)
Total Bilirubin: 3.9 mg/dL — ABNORMAL HIGH (ref 0.3–1.2)
Total Protein: 5.1 g/dL — ABNORMAL LOW (ref 6.5–8.1)

## 2022-03-19 LAB — LACTIC ACID, PLASMA
Lactic Acid, Venous: 8.1 mmol/L (ref 0.5–1.9)
Lactic Acid, Venous: 8.8 mmol/L (ref 0.5–1.9)
Lactic Acid, Venous: >9 mmol/L (ref 0.5–1.9)

## 2022-03-19 LAB — CBC
HCT: 27.6 % — ABNORMAL LOW (ref 36.0–46.0)
Hemoglobin: 9.3 g/dL — ABNORMAL LOW (ref 12.0–15.0)
MCH: 32.4 pg (ref 26.0–34.0)
MCHC: 33.7 g/dL (ref 30.0–36.0)
MCV: 96.2 fL (ref 80.0–100.0)
Platelets: 60 10*3/uL — ABNORMAL LOW (ref 150–400)
RBC: 2.87 MIL/uL — ABNORMAL LOW (ref 3.87–5.11)
RDW: 19.8 % — ABNORMAL HIGH (ref 11.5–15.5)
WBC: 25.4 10*3/uL — ABNORMAL HIGH (ref 4.0–10.5)
nRBC: 1 % — ABNORMAL HIGH (ref 0.0–0.2)

## 2022-03-19 LAB — PHOSPHORUS: Phosphorus: 4.9 mg/dL — ABNORMAL HIGH (ref 2.5–4.6)

## 2022-03-19 LAB — PROCALCITONIN: Procalcitonin: 0.94 ng/mL

## 2022-03-19 LAB — MAGNESIUM: Magnesium: 1.9 mg/dL (ref 1.7–2.4)

## 2022-03-19 MED ORDER — ACETAMINOPHEN 325 MG PO TABS: 650.0000 mg | ORAL_TABLET | Freq: Four times a day (QID) | ORAL | Status: DC | PRN

## 2022-03-19 MED ORDER — HYDROCORTISONE SOD SUC (PF) 100 MG IJ SOLR: 100.0000 mg | Freq: Two times a day (BID) | INTRAMUSCULAR | Status: DC

## 2022-03-19 MED ORDER — DIPHENHYDRAMINE HCL 50 MG/ML IJ SOLN: 25.0000 mg | INTRAMUSCULAR | Status: DC | PRN

## 2022-03-19 MED ORDER — SODIUM CHLORIDE 0.9 % IV SOLN: INTRAVENOUS | Status: DC

## 2022-03-19 MED ORDER — SODIUM CHLORIDE 0.9 % IV BOLUS
500.0000 mL | Freq: Once | INTRAVENOUS | Status: AC
  Administered 2022-03-19: 500 mL via INTRAVENOUS

## 2022-03-19 MED ORDER — ALBUMIN HUMAN 25 % IV SOLN
25.0000 g | Freq: Once | INTRAVENOUS | Status: AC
  Administered 2022-03-19: 25 g via INTRAVENOUS
  Filled 2022-03-19: qty 100

## 2022-03-19 MED ORDER — GLYCOPYRROLATE 0.2 MG/ML IJ SOLN: 0.2000 mg | INTRAMUSCULAR | Status: DC | PRN

## 2022-03-19 MED ORDER — MORPHINE BOLUS VIA INFUSION
5.0000 mg | INTRAVENOUS | Status: DC | PRN
  Administered 2022-03-19: 5 mg via INTRAVENOUS

## 2022-03-19 MED ORDER — SODIUM BICARBONATE 8.4 % IV SOLN
50.0000 meq | Freq: Once | INTRAVENOUS | Status: AC
  Administered 2022-03-19: 50 meq via INTRAVENOUS
  Filled 2022-03-19: qty 50

## 2022-03-19 MED ORDER — VASOPRESSIN 20 UNITS/100 ML INFUSION FOR SHOCK
0.0000 [IU]/min | INTRAVENOUS | Status: DC
  Administered 2022-03-19: 0.04 [IU]/min via INTRAVENOUS
  Filled 2022-03-19: qty 100

## 2022-03-19 MED ORDER — ACETAMINOPHEN 650 MG RE SUPP: 650.0000 mg | Freq: Four times a day (QID) | RECTAL | Status: DC | PRN

## 2022-03-19 MED ORDER — MORPHINE 100MG IN NS 100ML (1MG/ML) PREMIX INFUSION
0.0000 mg/h | INTRAVENOUS | Status: DC
  Administered 2022-03-19: 5 mg/h via INTRAVENOUS
  Filled 2022-03-19: qty 100

## 2022-03-19 MED ORDER — METHYLPREDNISOLONE SODIUM SUCC 40 MG IJ SOLR: 40.0000 mg | Freq: Two times a day (BID) | INTRAMUSCULAR | Status: DC

## 2022-03-19 MED ORDER — GLYCOPYRROLATE 1 MG PO TABS: 1.0000 mg | ORAL_TABLET | ORAL | Status: DC | PRN

## 2022-03-19 MED ORDER — LORAZEPAM 2 MG/ML IJ SOLN: 2.0000 mg | INTRAMUSCULAR | Status: DC | PRN

## 2022-03-19 MED ORDER — POLYVINYL ALCOHOL 1.4 % OP SOLN: 1.0000 [drp] | Freq: Four times a day (QID) | OPHTHALMIC | Status: DC | PRN

## 2022-03-20 ENCOUNTER — Ambulatory Visit: Payer: Medicare Other | Admitting: Gastroenterology

## 2022-03-20 ENCOUNTER — Ambulatory Visit: Payer: Medicare Other

## 2022-03-20 LAB — GLUCOSE, BODY FLUID OTHER: Glucose, Body Fluid Other: 138 mg/dL

## 2022-03-20 LAB — PROTEIN, BODY FLUID (OTHER): Total Protein, Body Fluid Other: <0.2 g/dL

## 2022-03-20 LAB — HEPATITIS B SURFACE ANTIBODY, QUANTITATIVE: Hep B S AB Quant (Post): <3.1 m[IU]/mL — ABNORMAL LOW (ref >9.9)

## 2022-03-20 LAB — PATHOLOGIST SMEAR REVIEW

## 2022-03-20 LAB — HEPATITIS B E ANTIBODY: Hep B E Ab: NEGATIVE

## 2022-03-20 LAB — ECHOCARDIOGRAM LIMITED: Weight: 2903.02 oz

## 2022-03-22 LAB — BODY FLUID CULTURE W GRAM STAIN: Gram Stain: NONE SEEN

## 2022-03-23 LAB — CULTURE, BLOOD (ROUTINE X 2)
Culture: NO GROWTH
Culture: NO GROWTH

## 2022-03-30 DIAGNOSIS — N186 End stage renal disease: Secondary | ICD-10-CM | POA: Diagnosis not present

## 2022-03-30 DIAGNOSIS — Z992 Dependence on renal dialysis: Secondary | ICD-10-CM | POA: Diagnosis not present

## 2022-03-31 NOTE — Progress Notes (Signed)
  Echocardiogram 2D Echocardiogram has been performed.  April [name] 01-Apr-2022, 1:45 PM

## 2022-03-31 NOTE — Progress Notes (Signed)
I had a prolonged discussion with the patient's son at bedside and with the daughter-in-law.  Patient was requiring higher dose of vasopressors.  Developing pulmonary edema and shortness of breath and poorly responsive.  Patient is currently a DNR.  Due to longstanding complex medical problems liver failure and kidney failure and declining overall quality of life in the past several months.  The son has chosen to start comfort measures only.  We discussed that comfort measures were involved discontinuing medications to increase blood pressure.  The only goal will be to control pain and provide comfort.  We discussed that death is anticipated in the hospital while on comfort measures. Son is in agreement including daughter-in-law.  Patient's nurse also at bedside.  Comfort measures have been started.  Time spent during this encounter 25 minutes

## 2022-03-31 NOTE — Progress Notes (Signed)
[facility]  ROUNDING NOTE   Subjective:   April [name] is a 81 year old female with past medical history including liver cirrhosis, anemia, diverticulitis, hypertension, CHF, CAD, and end stage renal disease on hemodialysis. She presents to the emergency department with hypotension from dialysis clinic. She has been admitted for Shock [facility]) [R57.9] Pleural effusion [J90] Other ascites [R18.8] Septic shock ([place]) [A41.9, R65.21]  This patient is known to our clinic and received outpatient dialysis treatments at [facility] on a TTS schedule, supervised by Dr. Candiss Norse.  Patient presents from dialysis clinic with blood pressure, 66/27. Reported UF of 1L. Chart review notes patient had a 6kg weight gain since previous treatment.  Patient seen and evaluated at bedside in the ICU. Responsive to pain only. Son at bedside. Was placed on Levo for BP support. Son is agreeable to proceed with comfort care. Now on Morphine drip.   Labs on ED arrival significant for sodium 132, serum bicarb 20, glucose 132, creatinine 3.12, calcium 8 point, albumin 1.7, elevated liver enzymes, and bilirubin 2.9.  Lactic acid 5.1.  Elevated WBCs at 22.5 with hemoglobin 10.6.Patient negative for COVID-19.  Chest x-ray negative for acute findings.  We have been consulted to manage dialysis needs.  Objective:  Vital signs in last 24 hours:  Temp:  [97.1 F (36.2 C)-98.2 F (36.8 C)] 98.2 F (36.8 C) (11/19 0400) Pulse Rate:  [76-106] 106 (11/19 0800) Resp:  [13-31] 21 (11/19 0800) BP: (47-168)/(20-135) 84/65 (11/19 0800) SpO2:  [92 %-100 %] 96 % (11/19 0800) Weight:  [81 kg-85.6 kg] 82.3 kg (11/19 0333)  Weight change:  Filed Weights   03/27/2022 1214 03/29/2022 1724 Apr 12, 2022 0333  Weight: 85.6 kg 81 kg 82.3 kg     Intake/Output: I/O last 3 completed shifts: In: 2648.2 [P.O.:250; I.V.:800.7; IV Piggyback:1597.5] Out: -    Intake/Output this shift:  No intake/output data recorded.  Physical  Exam: General: Chronically ill-appearing  Head: Normocephalic, atraumatic.   Eyes: Anicteric  Lungs:  Agonal breathing  Heart: Irregular rate and rhythm, no murmurs  Abdomen:  Soft, nontender  Extremities:  3+  peripheral edema  Neurologic: Responsive to painful stimuli  Skin: No lesions  Access: Rt chest permcath    Basic Metabolic Panel: Recent Labs  Lab 03/25/2022 1217 04-12-2022 0255  NA 132* 133*  K 4.5 3.8  CL 100 101  CO2 20* 17*  GLUCOSE 132* 142*  BUN 18 23  CREATININE 3.12* 3.50*  CALCIUM 8.8* 9.3  MG  --  1.9  PHOS  --  4.9*     Liver Function Tests: Recent Labs  Lab 03/21/2022 1217 2022/04/12 0255  AST 197* 253*  ALT 68* 72*  ALKPHOS 182* 160*  BILITOT 2.9* 3.9*  PROT 4.7* 5.1*  ALBUMIN 1.7* 2.4*    No results for input(s): "LIPASE", "AMYLASE" in the last 168 hours. Recent Labs  Lab 03/03/2022 1926  AMMONIA 64*      CBC: Recent Labs  Lab 03/29/2022 1217 12-Apr-2022 0255  WBC 22.5* 25.4*  NEUTROABS 18.6*  --   HGB 10.6* 9.3*  HCT 31.1* 27.6*  MCV 97.2 96.2  PLT 42* 60*     Cardiac Enzymes: No results for input(s): "CKTOTAL", "CKMB", "CKMBINDEX", "TROPONINI" in the last 168 hours.  BNP: Invalid input(s): "POCBNP"  CBG: Recent Labs  Lab 03/07/2022 1726  GLUCAP 132*     Microbiology: Results for orders placed or performed during the hospital encounter of 03/10/2022  Culture, blood (Routine x 2)  Status: None (Preliminary result)   Collection Time: 03/04/2022 12:19 PM   Specimen: Right Antecubital; Blood  Result Value Ref Range Status   Specimen Description RIGHT ANTECUBITAL  Final   Special Requests   Final    BOTTLES DRAWN AEROBIC AND ANAEROBIC Blood Culture results may not be optimal due to an inadequate volume of blood received in culture bottles   Culture   Final    NO GROWTH < 24 HOURS Performed at [facility], [address] [place], [place] [address]    Report Status PENDING  Incomplete  Culture, blood (Routine x  2)     Status: None (Preliminary result)   Collection Time: 03/12/2022 12:46 PM   Specimen: Left Antecubital; Blood  Result Value Ref Range Status   Specimen Description LEFT ANTECUBITAL  Final   Special Requests   Final    BOTTLES DRAWN AEROBIC ONLY Blood Culture results may not be optimal due to an inadequate volume of blood received in culture bottles   Culture   Final    NO GROWTH < 24 HOURS Performed at [facility], [address] [place], [place] [address]    Report Status PENDING  Incomplete  Body fluid culture w Gram Stain     Status: None (Preliminary result)   Collection Time: 03/17/2022  1:37 PM   Specimen: Ascitic; Body Fluid  Result Value Ref Range Status   Specimen Description   Final    ASCITIC Performed at [facility], [address] [place], [place] [address]    Special Requests   Final    NONE Performed at [facility], [place], [place] [address]    Gram Stain NO WBC SEEN NO ORGANISMS SEEN   Final   Culture   Final    NO GROWTH < 12 HOURS Performed at [facility], [place] [address], [place], [place] [address]    Report Status PENDING  Incomplete  MRSA Next Gen by PCR, Nasal     Status: None   Collection Time: 03/16/2022  5:28 PM   Specimen: Nasal Mucosa; Nasal Swab  Result Value Ref Range Status   MRSA by PCR Next Gen NOT DETECTED NOT DETECTED Final    Comment: (NOTE) The GeneXpert MRSA Assay (FDA approved for NASAL specimens only), is one component of a comprehensive MRSA colonization surveillance program. It is not intended to diagnose MRSA infection nor to guide or monitor treatment for MRSA infections. Test performance is not FDA approved in patients less than 51 years old. Performed at [facility], [address] [place],  [address]   SARS Coronavirus 2 by RT PCR (hospital order, performed in [facility] hospital lab) *cepheid single result test* Anterior Nasal Swab     Status: None    Collection Time: 03/27/2022 10:58 PM   Specimen: Anterior Nasal Swab  Result Value Ref Range Status   SARS Coronavirus 2 by RT PCR NEGATIVE NEGATIVE Final    Comment: (NOTE) SARS-CoV-2 target nucleic acids are NOT DETECTED.  The SARS-CoV-2 RNA is generally detectable in upper and lower respiratory specimens during the acute phase of infection. The lowest concentration of SARS-CoV-2 viral copies this assay can detect is 250 copies / mL. A negative result does not preclude SARS-CoV-2 infection and should not be used as the sole basis for treatment or other patient management decisions.  A negative result may occur with improper specimen collection / handling, submission of specimen other than nasopharyngeal swab,  presence of viral mutation(s) within the areas targeted by this assay, and inadequate number of viral copies (<250 copies / mL). A negative result must be combined with clinical observations, patient history, and epidemiological information.  Fact Sheet for Patients:   https://www.patel.info/  Fact Sheet for Healthcare Providers: https://hall.com/  This test is not yet approved or  cleared by the [place] FDA and has been authorized for detection and/or diagnosis of SARS-CoV-2 by FDA under an Emergency Use Authorization (EUA).  This EUA will remain in effect (meaning this test can be used) for the duration of the COVID-19 declaration under Section 564(b)(1) of the Act, 21 U.S.C. section 360bbb-3(b)(1), unless the authorization is terminated or revoked sooner.  Performed at [facility], [address] [place], [place] [address]     Coagulation Studies: Recent Labs    03/25/2022 1217  LABPROT 22.3*  INR 2.0*      Urinalysis: No results for input(s): "COLORURINE", "LABSPEC", "PHURINE", "GLUCOSEU", "HGBUR", "BILIRUBINUR", "KETONESUR", "PROTEINUR", "UROBILINOGEN", "NITRITE", "LEUKOCYTESUR" in the last 72  hours.  Invalid input(s): "APPERANCEUR"     Imaging: DG Chest Port 1 View  Result Date: 04-04-2022 CLINICAL DATA:  [id number] with sepsis. EXAM: PORTABLE CHEST 1 VIEW COMPARISON:  Portable chest yesterday at 6:54 p.m. FINDINGS: 5:03 a.m. Right IJ dialysis catheter with distal split lumens, again terminates in the upper right atrium. Left IJ line tip at about the superior cavoatrial junction as before. The heart is slightly enlarged. There is increased mild central vascular prominence. There are increased left perihilar alveolar opacities which could be pneumonia or edema. Moderate left and small right pleural effusions continue to be seen with consolidation or atelectasis in the left lower lung field. The hypoexpanded right lung appears generally clear. Left apex clear. No pneumothorax. Stable mediastinum with aortic atherosclerosis. Thoracic spondylosis. IMPRESSION: 1. Increased left perihilar alveolar opacities which could be pneumonia or edema. 2. Moderate left and small right pleural effusions continue to be seen with consolidation or atelectasis in the left lower lung field. 3. Increased mild central vascular prominence. 4. Stable support apparatus. Electronically Signed   By: Telford Nab M.D.   On: 2022/04/04 07:15   DG Chest Port 1 View  Result Date: 03/06/2022 CLINICAL DATA:  Central line EXAM: PORTABLE CHEST 1 VIEW COMPARISON:  Chest x-ray 03/01/2022 FINDINGS: Right-sided dual lumen catheter tip is in the SVC. New left-sided central venous catheter tip projects over the SVC. There is no pneumothorax. The cardiomediastinal silhouette is within normal limits and stable. Small left pleural effusion persists. No acute fractures. IMPRESSION: 1. New left-sided central venous catheter tip projects over the SVC. No pneumothorax. 2. Stable small left pleural effusion. Electronically Signed   By: Ronney Asters M.D.   On: 03/09/2022 19:13   DG Chest Port 1 View  Result Date: 03/21/2022 CLINICAL  DATA:  Dialysis patient.  Hypotension EXAM: PORTABLE CHEST 1 VIEW COMPARISON:  03/02/2022 FINDINGS: Large-bore dialysis catheter unchanged. Stable cardiac silhouette. There is moderate LEFT effusion increased from prior. RIGHT effusion improved comparison exam. No pneumothorax. No pulmonary edema. IMPRESSION: 1. Increase in size of LEFT pleural effusion. 2. Decrease in volume of RIGHT pleural effusion. 3. No pulmonary edema identified Electronically Signed   By: Suzy Bouchard M.D.   On: 03/27/2022 12:53     Medications:    sodium chloride     morphine       Chlorhexidine Gluconate Cloth  6 each Topical Q0600   acetaminophen **OR** acetaminophen, diphenhydrAMINE, docusate sodium, glycopyrrolate **OR**  glycopyrrolate **OR** glycopyrrolate, LORazepam, morphine, ondansetron (ZOFRAN) IV, polyethylene glycol, polyvinyl alcohol  Assessment/ Plan:  April [name] is a 81 y.o.  female with past medical history including liver cirrhosis, anemia, diverticulitis, hypertension, CHF, CAD, and end stage renal disease on hemodialysis. She presents to the emergency department with hypotensive. She has been admitted for Shock [facility]) [R57.9] Pleural effusion [J90] Other ascites [R18.8] Septic shock ([place]) [A41.9, R65.21]  [facility]/TTS/right chest PermCath  End stage renal disease on hemodialysis.  Last treatment received prior to ED arrival, UF 1 L achieved.  Patient has had multiple admissions recently.  Discussed patient's condition with son at bedside, he is agreeable to proceed with comfort measures.  We plan for no further dialysis at this time.   2. Anemia of chronic kidney disease  Lab Results  Component Value Date   HGB 9.3 (L) 04-04-22  Hemoglobin remains above target.  3. Secondary Hyperparathyroidism:   Lab Results  Component Value Date   PTH 32 02/03/2022   CALCIUM 9.3 04-Apr-2022   CAION 1.17 03/25/2016   PHOS 4.9 (H) Apr 04, 2022    We will monitor bone minerals  during this admission.  4.  Hypotension believed due to septic shock, source unknown.  Patient placed on levo for blood pressure support.  This has been stopped due to transition to comfort measures.   LOS: 1 Esbeydi Manago 12-05-202310:22 AM

## 2022-03-31 NOTE — Progress Notes (Signed)
I responded to a page from the nurse to provide spiritual support for the patient's family. I arrived at the patient's room where her son [name] was present. I provided spiritual support through pastoral presence, reading scripture, and leading in prayer.    03-21-22 0946  Clinical Encounter Type  Visited With Patient and family together  Visit Type Initial;Spiritual support  Referral From Nurse  Consult/Referral To Chaplain  Spiritual Encounters  Spiritual Needs [facility] text;Prayer    Chaplain Dr Redgie Grayer

## 2022-03-31 NOTE — Discharge Summary (Signed)
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  She has not been able to tolerate dialysis secondary to hypotension.  During this hospitalization she was found to have septic shock, the patient was brought to the ICU and vasopressors were initiated.  Overnight the patient had progressively low blood pressure and required multiple vasopressors at high doses.  Patient also developed hypoxic respiratory failure and bilateral pleural effusions.  Due to poor quality of life and current medical condition.  The patient son chose comfort measures only.  Comfort measures were discussed with the patient's son, vasopressors were stopped.  Patient was started on morphine for comfort.  Patient died at around 12:16 PM on 2023-04-15.            The results of significant diagnostics from this hospitalization (including imaging, microbiology, ancillary and laboratory) are listed below for reference.   Significant Diagnostic Studies: ECHOCARDIOGRAM LIMITED  Result Date: 03/20/2022    ECHOCARDIOGRAM LIMITED REPORT   Patient Name:   CAROLAN AVEDISIAN Date of Exam: 2022-04-14 Medical Rec #:  [id number]       Height:       57.0 in Accession #:    [id number]      Weight:       181.4 lb Date of Birth:  01-Feb-1941       BSA:          1.726 m Patient Age:    66 years        BP:           135/112 mmHg Patient Gender: F               HR:           95 bpm. Exam Location:  [facility] Procedure: Limited Echo  Indications:     CHF acute diastolic  History:         Patient has prior history of Echocardiogram examinations, most                  recent 01/11/2022. CHF, CAD and Previous Myocardial Infarction,                  Arrythmias:Atrial Fibrillation, Signs/Symptoms:Dyspnea; Risk                  Factors:Diabetes and Sleep Apnea. GERD, Liver cirrhosis.  Sonographer:     Eartha Inch Referring Phys:  [id number] Leeta Grimme Diagnosing Phys: Neoma Laming  Sonographer Comments: Technically difficult study due to poor echo windows. IMPRESSIONS  1. Left ventricular ejection fraction, by estimation, is 60 to 65%. The left ventricle has normal function. The left ventricle has no regional wall motion abnormalities.  2. Right ventricular systolic function is normal. The right ventricular size is normal.  3. The mitral valve is normal in structure. No evidence of mitral valve regurgitation. No evidence of mitral stenosis.  4. The aortic valve is normal in structure. Aortic valve regurgitation is not visualized. No aortic  stenosis is present.  5. The inferior vena cava is normal in size with greater than 50% respiratory variability, suggesting right atrial pressure of 3 mmHg. Comparison(s): No pericardial effusion, but left pleuural effusion seen. FINDINGS  Left Ventricle: Left ventricular ejection fraction, by estimation, is 60 to 65%. The left ventricle has normal function. The left ventricle has no regional wall motion abnormalities. The left ventricular internal cavity size was normal in size. There is  no left ventricular hypertrophy. Right Ventricle: The right ventricular size is normal. No increase in right ventricular wall thickness. Right ventricular systolic function is normal. Left Atrium: Left atrial size was normal in size. Right Atrium: Right atrial size was normal in size. Pericardium: There is no evidence of pericardial effusion. Mitral Valve: The mitral valve is normal in structure. No evidence of mitral valve  stenosis. Tricuspid Valve: The tricuspid valve is normal in structure. Tricuspid valve regurgitation is not demonstrated. No evidence of tricuspid stenosis. Aortic Valve: The aortic valve is normal in structure. Aortic valve regurgitation is not visualized. No aortic stenosis is present. Pulmonic Valve: The pulmonic valve was normal in structure. Pulmonic valve regurgitation is not visualized. No evidence of pulmonic stenosis. Aorta: The aortic root is normal in size and structure. Venous: The inferior vena cava is normal in size with greater than 50% respiratory variability, suggesting right atrial pressure of 3 mmHg. IAS/Shunts: No atrial level shunt detected by color flow Doppler. Additional Comments: There is a small pleural effusion in the left lateral region.  Neoma Laming Electronically signed by Neoma Laming Signature Date/Time: 03/20/2022/11:40:33 AM    Final    DG Chest Port 1 View  Result Date: 04-01-2022 CLINICAL DATA:  [id number] with sepsis. EXAM: PORTABLE CHEST 1 VIEW COMPARISON:  Portable chest yesterday at 6:54 p.m. FINDINGS: 5:03 a.m. Right IJ dialysis catheter with distal split lumens, again terminates in the upper right atrium. Left IJ line tip at about the superior cavoatrial junction as before. The heart is slightly enlarged. There is increased mild central vascular prominence. There are increased left perihilar alveolar opacities which could be pneumonia or edema. Moderate left and small right pleural effusions continue to be seen with consolidation or atelectasis in the left lower lung field. The hypoexpanded right lung appears generally clear. Left apex clear. No pneumothorax. Stable mediastinum with aortic atherosclerosis. Thoracic spondylosis. IMPRESSION: 1. Increased left perihilar alveolar opacities which could be pneumonia or edema. 2. Moderate left and small right pleural effusions continue to be seen with consolidation or atelectasis in the left lower lung field. 3. Increased mild  central vascular prominence. 4. Stable support apparatus. Electronically Signed   By: Telford Nab M.D.   On: 2022-04-01 07:15   DG Chest Port 1 View  Result Date: 03/11/2022 CLINICAL DATA:  Central line EXAM: PORTABLE CHEST 1 VIEW COMPARISON:  Chest x-ray 03/10/2022 FINDINGS: Right-sided dual lumen catheter tip is in the SVC. New left-sided central venous catheter tip projects over the SVC. There is no pneumothorax. The cardiomediastinal silhouette is within normal limits and stable. Small left pleural effusion persists. No acute fractures. IMPRESSION: 1. New left-sided central venous catheter tip projects over the SVC. No pneumothorax. 2. Stable small left pleural effusion. Electronically Signed   By: Ronney Asters M.D.   On: 03/06/2022 19:13   DG Chest Port 1 View  Result Date: 03/05/2022 CLINICAL DATA:  Dialysis patient.  Hypotension EXAM: PORTABLE CHEST 1 VIEW COMPARISON:  03/02/2022 FINDINGS: Large-bore dialysis catheter unchanged. Stable cardiac silhouette. There is moderate LEFT  effusion increased from prior. RIGHT effusion improved comparison exam. No pneumothorax. No pulmonary edema. IMPRESSION: 1. Increase in size of LEFT pleural effusion. 2. Decrease in volume of RIGHT pleural effusion. 3. No pulmonary edema identified Electronically Signed   By: Suzy Bouchard M.D.   On: 03/03/2022 12:53   CT Head Wo Contrast  Result Date: 03/09/2022 CLINICAL DATA:  Head injury after wheelchair accident. EXAM: CT HEAD WITHOUT CONTRAST CT CERVICAL SPINE WITHOUT CONTRAST TECHNIQUE: Multidetector CT imaging of the head and cervical spine was performed following the standard protocol without intravenous contrast. Multiplanar CT image reconstructions of the cervical spine were also generated. RADIATION DOSE REDUCTION: This exam was performed according to the departmental dose-optimization program which includes automated exposure control, adjustment of the mA and/or kV according to patient size and/or use  of iterative reconstruction technique. COMPARISON:  March 06, 2022. FINDINGS: CT HEAD FINDINGS Brain: Grossly stable left frontal subdural hematoma compared to prior exam. No new hemorrhage is noted. Ventricular size is within normal limits. No significant midline shift is noted. No acute infarction or mass lesion is noted. Vascular: No hyperdense vessel or unexpected calcification. Skull: Normal. Negative for fracture or focal lesion. Sinuses/Orbits: No acute finding. Other: None. CT CERVICAL SPINE FINDINGS Alignment: Normal. Skull base and vertebrae: No acute fracture. No primary bone lesion or focal pathologic process. Soft tissues and spinal canal: No prevertebral fluid or swelling. No visible canal hematoma. Disc levels: Severe degenerative disc disease is noted at C5-6 and C6-7. Upper chest: Left pleural effusion is noted. Other: Degenerative changes are seen involving the left-sided posterior facet joints. IMPRESSION: Grossly stable left frontal subdural hematoma is noted compared to prior exam performed 3 days earlier. No significant midline shift is noted. No new hemorrhage or other intracranial abnormality is noted. 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 No acute abnormality is noted. Electronically Signed   By: Marijo Conception M.D.   On: 03/09/2022 11:44   CT Cervical Spine Wo Contrast  Result Date: 03/09/2022 CLINICAL DATA:  Head injury after wheelchair accident. EXAM: CT HEAD WITHOUT CONTRAST CT CERVICAL SPINE WITHOUT CONTRAST TECHNIQUE: Multidetector CT imaging of the head and cervical spine was performed following the standard protocol without intravenous contrast. Multiplanar CT image reconstructions of the cervical spine were also generated. RADIATION DOSE REDUCTION: This exam was performed according to the departmental dose-optimization program which includes automated exposure control, adjustment of the mA and/or kV according to patient size and/or use of  iterative reconstruction technique. COMPARISON:  March 06, 2022. FINDINGS: CT HEAD FINDINGS Brain: Grossly stable left frontal subdural hematoma compared to prior exam. No new hemorrhage is noted. Ventricular size is within normal limits. No significant midline shift is noted. No acute infarction or mass lesion is noted. Vascular: No hyperdense vessel or unexpected calcification. Skull: Normal. Negative for fracture or focal lesion. Sinuses/Orbits: No acute finding. Other: None. CT CERVICAL SPINE FINDINGS Alignment: Normal. Skull base and vertebrae: No acute fracture. No primary bone lesion or focal pathologic process. Soft tissues and spinal canal: No prevertebral fluid or swelling. No visible canal hematoma. Disc levels: Severe degenerative disc disease is noted at C5-6 and C6-7. Upper chest: Left pleural effusion is noted. Other: Degenerative changes are seen involving the left-sided posterior facet joints. IMPRESSION: Grossly stable left frontal subdural hematoma is noted compared to prior exam performed 3 days earlier. No significant midline shift is noted. No new hemorrhage or other intracranial abnormality is noted. Severe multilevel degenerative disc disease in the cervical  spine. No acute abnormality is noted. Electronically Signed   By: Marijo Conception M.D.   On: 03/09/2022 11:44   US Paracentesis  Result Date: 03/06/2022 INDICATION: Patient with history of cirrhosis and recurrent ascites. Request received to perform therapeutic paracentesis. EXAM: ULTRASOUND GUIDED THERAPEUTIC RIGHT LOWER QUADRANT PARACENTESIS MEDICATIONS: 10 mL 1 % lidocaine COMPLICATIONS: None immediate. PROCEDURE: Informed written consent was obtained from the patient after a discussion of the risks, benefits and alternatives to treatment. A timeout was performed prior to the initiation of the procedure. Initial ultrasound scanning demonstrates a large amount of ascites within the right lower abdominal quadrant. The right lower  abdomen was prepped and draped in the usual sterile fashion. 1% lidocaine was used for local anesthesia. Following this, a 19 gauge, 7-cm, Yueh catheter was introduced. An ultrasound image was saved for documentation purposes. The paracentesis was performed. The catheter was removed and a dressing was applied. The patient tolerated the procedure well without immediate post procedural complication. FINDINGS: A total of approximately 4.4 L of clear, yellow fluid was removed. IMPRESSION: Successful ultrasound-guided paracentesis yielding 4.4 liters of peritoneal fluid. Read by: Narda Rutherford, AGNP-BC PLAN: The patient has previously been evaluated by the [facility], and deemed not a candidate for intervention. Michaelle Birks, MD [facility] Radiology Electronically Signed   By: Michaelle Birks M.D.   On: 03/06/2022 16:45   CT HEAD WO CONTRAST (5MM)  Result Date: 03/06/2022 CLINICAL DATA:  Subdural hematoma EXAM: CT HEAD WITHOUT CONTRAST TECHNIQUE: Contiguous axial images were obtained from the base of the skull through the vertex without intravenous contrast. RADIATION DOSE REDUCTION: This exam was performed according to the departmental dose-optimization program which includes automated exposure control, adjustment of the mA and/or kV according to patient size and/or use of iterative reconstruction technique. COMPARISON:  CT head 03/03/2022 FINDINGS: Brain: The left cerebral convexity subdural hematoma measuring up to proximally 8 mm in maximal thickness in the coronal plane is not significantly changed in size when remeasured using similar technique and is overall decreased in density, consistent with evolving blood products. The component of blood along the left tentorial leaflet is decreased in conspicuity. There is no new hyperdense blood within the collection to suggest acute/interval bleed. There is minimal mass  effect on the underlying brain parenchyma with no midline shift. There is no new acute intracranial hemorrhage or extra-axial fluid collection. There is no acute territorial infarct. The ventricles are stable in size.  There is no solid mass lesion. Vascular: There is calcification of the bilateral carotid siphons and vertebral arteries. Skull: Normal. Negative for fracture or focal lesion. Sinuses/Orbits: The imaged paranasal sinuses are clear. Bilateral lens implants are in place. The globes and orbits are otherwise unremarkable. Other: The calcified left parieto-occipital scalp lesion is unchanged. IMPRESSION: Expected evolution of 8 mm maximally thick left cerebral convexity subdural hematoma with no midline shift. No new acute intracranial pathology. Electronically Signed   By: Valetta Mole M.D.   On: 03/06/2022 09:44   CT HEAD WO CONTRAST (5MM)  Result Date: 03/03/2022 CLINICAL DATA:  Mental status change, subdural hematoma. 6 hour follow-up from yesterday. EXAM: CT HEAD WITHOUT CONTRAST TECHNIQUE: Contiguous axial images were obtained from the base of the skull through the vertex without intravenous contrast. RADIATION DOSE REDUCTION: This exam was performed according to the departmental dose-optimization program which includes automated exposure control, adjustment of the mA and/or kV according to patient size and/or use of iterative  reconstruction technique. COMPARISON:  CT yesterday at 5:45 p.m. FINDINGS: Brain: Images of the current study were completed 12:06 a.m., 03/03/2022. Again noted is a 7 mm in thickness lateral left frontotemporal convexity subdural hematoma, and a 3 mm in thickness subdural bleed along the superior aspect of the left leaf of the tentorium. There has been no significant interval change. There is slight underlying left frontotemporal gyral crowding and no more than 2 mm of left-to-right midline shift of the septum pellucidum. There is no downward mass effect. There is mild  cerebellar atrophy and mild-to-moderate cerebral atrophy and atrophic ventriculomegaly with mild small-vessel disease in the cerebral white matter. Basal cisterns are clear. No cortical based infarct, parenchymal bleed or mass is seen. Vascular: There are calcifications in the distal vertebral arteries and carotid siphons. No hyperdense central vessels. Skull: Negative for fractures or focal lesions. Sinuses/Orbits: No acute finding. No mastoid effusion or new sinus disease. Small retention cyst left sphenoid air cell. Other: None. IMPRESSION: 1. Stable 7 mm in thickness left frontotemporal convexity subdural hematoma and 3 mm in thickness left tentorial subdural bleed. No new or worsening hemorrhage. 2. 2 mm left-to-right midline shift of the septum pellucidum, no downward mass effect. 3. Atrophy and small-vessel disease. Electronically Signed   By: Telford Nab M.D.   On: 03/03/2022 00:20   CT HEAD WO CONTRAST (5MM)  Result Date: 03/02/2022 CLINICAL DATA:  Delirium EXAM: CT HEAD WITHOUT CONTRAST TECHNIQUE: Contiguous axial images were obtained from the base of the skull through the vertex without intravenous contrast. RADIATION DOSE REDUCTION: This exam was performed according to the departmental dose-optimization program which includes automated exposure control, adjustment of the mA and/or kV according to patient size and/or use of iterative reconstruction technique. COMPARISON:  MRI head 04/26/2017. FINDINGS: Brain: Next density subdural hemorrhage along the left cerebral convexity, measuring up to 8 mm in thickness. Also, trace acute hemorrhage layering along the left tentorial leaflet. Trace resulting rightward midline shift. No evidence of acute large vascular territory infarct, mass lesion or hydrocephalus. Mild for age cerebral atrophy with ex vacuo ventricular dilation. Vascular: No hyperdense vessel identified. Skull: No acute fracture. Sinuses/Orbits: Largely clear sinuses.  No acute orbital  findings. Other: No mastoid effusions. IMPRESSION: Mixed density subdural hemorrhage along the left cerebral convexity and extending along the left tentorial leaflet, measuring up to 8 mm in thickness. Intermixed hyperdense component is compatible with acute/recent hemorrhage. Trace resulting rightward midline shift. Findings discussed with Dr. Ellender Hose via telephone at 6:22 p.m. Electronically Signed   By: Margaretha Sheffield M.D.   On: 03/02/2022 18:23   DG Chest 2 View  Result Date: 03/02/2022 CLINICAL DATA:  Weakness. EXAM: CHEST - 2 VIEW COMPARISON:  February 01, 2022. FINDINGS: Stable cardiomediastinal silhouette. Right internal jugular catheter is unchanged in position. Mild bilateral pleural effusions are noted with associated atelectasis which is increased compared to prior exam. Stable hiatal hernia. Bony thorax is unremarkable. IMPRESSION: Increased bilateral pleural effusions are noted with associated atelectasis. Electronically Signed   By: Marijo Conception M.D.   On: 03/02/2022 16:40    Microbiology: Recent Results (from the past 240 hour(s))  Culture, blood (Routine x 2)     Status: None (Preliminary result)   Collection Time: 03/22/2022 12:19 PM   Specimen: Right Antecubital; Blood  Result Value Ref Range Status   Specimen Description RIGHT ANTECUBITAL  Final   Special Requests   Final    BOTTLES DRAWN AEROBIC AND ANAEROBIC Blood Culture results may not be  optimal due to an inadequate volume of blood received in culture bottles   Culture   Final    NO GROWTH 3 DAYS Performed at [facility], [address] [place], [place] [address]    Report Status PENDING  Incomplete  Culture, blood (Routine x 2)     Status: None (Preliminary result)   Collection Time: 03/13/2022 12:46 PM   Specimen: Left Antecubital; Blood  Result Value Ref Range Status   Specimen Description LEFT ANTECUBITAL  Final   Special Requests   Final    BOTTLES DRAWN AEROBIC ONLY Blood Culture results may not be  optimal due to an inadequate volume of blood received in culture bottles   Culture   Final    NO GROWTH 3 DAYS Performed at [facility] [address], [place], [place] [address]    Report Status PENDING  Incomplete  Body fluid culture w Gram Stain     Status: None (Preliminary result)   Collection Time: 03/27/2022  1:37 PM   Specimen: Ascitic; Body Fluid  Result Value Ref Range Status   Specimen Description   Final    ASCITIC Performed at [facility], [address], [place], [place] [address]    Special Requests   Final    NONE Performed at [facility], [address] [place], [place] [address]    Gram Stain   Final    NO WBC SEEN NO ORGANISMS SEEN Performed at [facility], [place] [address] [place], [place] [address]    Culture RARE STREPTOCOCCUS MITIS/ORALIS  Final   Report Status PENDING  Incomplete  MRSA Next Gen by PCR, Nasal     Status: None   Collection Time: 03/08/2022  5:28 PM   Specimen: Nasal Mucosa; Nasal Swab  Result Value Ref Range Status   MRSA by PCR Next Gen NOT DETECTED NOT DETECTED Final    Comment: (NOTE) The GeneXpert MRSA Assay (FDA approved for NASAL specimens only), is one component of a comprehensive MRSA colonization surveillance program. It is not intended to diagnose MRSA infection nor to guide or monitor treatment for MRSA infections. Test performance is not FDA approved in patients less than 79 years old. Performed at [facility], [address] [place], [place] [address]   SARS Coronavirus 2 by RT PCR (hospital order, performed in [facility] hospital lab) *cepheid single result test* Anterior Nasal Swab     Status: None   Collection Time: 03/10/2022 10:58 PM   Specimen: Anterior Nasal Swab  Result Value Ref Range Status   SARS Coronavirus 2 by RT PCR NEGATIVE NEGATIVE Final    Comment: (NOTE) SARS-CoV-2 target nucleic acids are NOT DETECTED.  The SARS-CoV-2 RNA is generally detectable in  upper and lower respiratory specimens during the acute phase of infection. The lowest concentration of SARS-CoV-2 viral copies this assay can detect is 250 copies / mL. A negative result does not preclude SARS-CoV-2 infection and should not be used as the sole basis for treatment or other patient management decisions.  A negative result may occur with improper specimen collection / handling, submission of specimen other than nasopharyngeal swab, presence of viral mutation(s) within the areas targeted by this assay, and inadequate number of viral copies (<250 copies / mL). A negative result must be combined with clinical observations, patient history, and epidemiological information.  Fact Sheet for Patients:   https://www.patel.info/  Fact Sheet for Healthcare Providers: https://hall.com/  This test is not yet approved  or  cleared by the [place] and has been authorized for detection and/or diagnosis of SARS-CoV-2 by FDA under an Emergency Use Authorization (EUA).  This EUA will remain in effect (meaning this test can be used) for the duration of the COVID-19 declaration under Section 564(b)(1) of the Act, 21 U.S.C. section 360bbb-3(b)(1), unless the authorization is terminated or revoked sooner.  Performed at [facility], [address] [place], [place] [address]       Signed: Jeani Hawking, MD 04-10-2022

## 2022-03-31 NOTE — Progress Notes (Signed)
Started this shift with BP on the 03'E systolic. Levophed 38mg/min running peripherally. Levophed infusion titrated as per parameters and moved to newly placed CVC. Bolus of 500 cc NSS over 2 hours given as pt's BP not improving despite uptitrating levophed. Started on stress dose steroids. Repeat Lactic acid=8.1 at 2336. Another bolus of 500 cc NSS given over 2 hours. Albumin 100 ml 25% administered as well. Repeat lactic acid=8.8 at 0230. NaHCO3 50 meq given. Vasopressin started. Per NP vasopressin to start once Levophed is maxed out.

## 2022-03-31 DEATH — deceased

## 2022-04-03 ENCOUNTER — Ambulatory Visit: Payer: Medicare Other | Admitting: Internal Medicine

## 2022-04-04 ENCOUNTER — Ambulatory Visit: Payer: Medicare Other | Admitting: Cardiovascular Disease

## 2022-04-10 ENCOUNTER — Ambulatory Visit: Payer: Medicare Other | Admitting: Physician Assistant

## 2022-05-01 NOTE — Progress Notes (Signed)
  Progress Note   Date: 03/28/2022  Patient Name: April [name]        MRN#: [id number]   Clarification of diagnosis:   sepsis with septic shock due to Streptococcus mitis organism  Spontaneous bacterial peritonitis

## 2022-06-01 ENCOUNTER — Telehealth: Payer: Self-pay | Admitting: Internal Medicine

## 2022-06-17 IMAGING — CT CT CHEST W/O CM
2 of 4 series · 15 of 36 positions shown, 18 images · non-contrast
Comparison: Previous studies including chest radiograph done on
08/08/2021 and CT chest done on 10/21/2012

CLINICAL DATA: Shortness of breath, interstitial lung disease



[Series 2: chest 2.00 · axial · 0.77mm/px · z∈[-1152,-902]mm · 12 of 149 slices shown, 15 images]
[im 12/149  mediastinal]
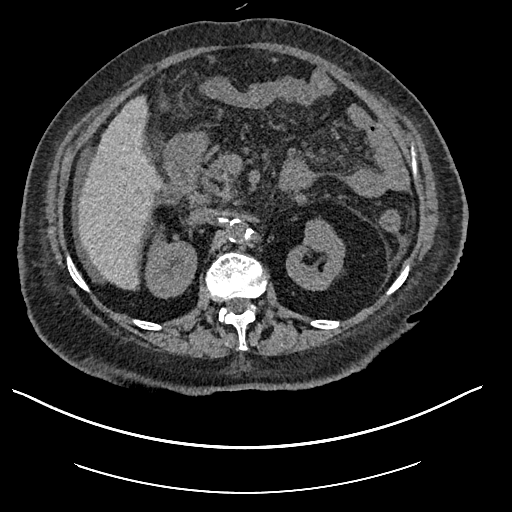
[im 12/149  lung]
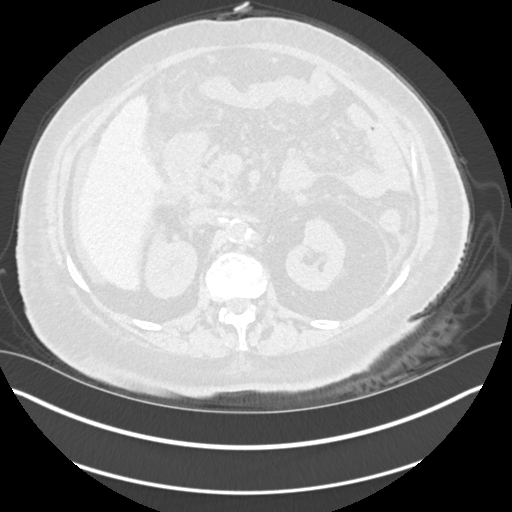
[im 23/149  lung]
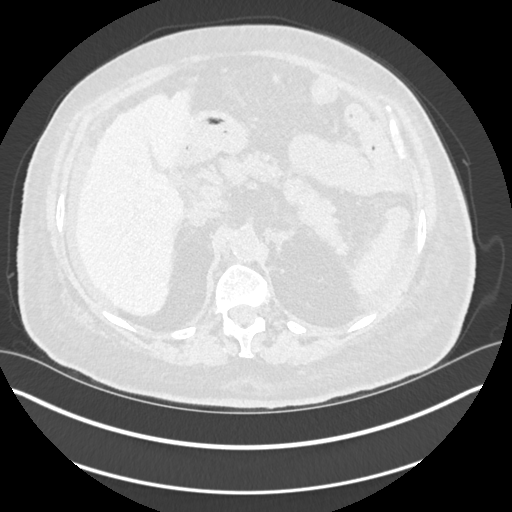
[im 35/149  lung]
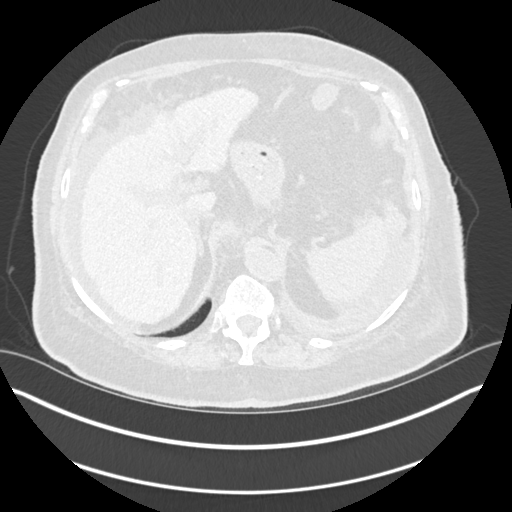
[im 46/149  lung]
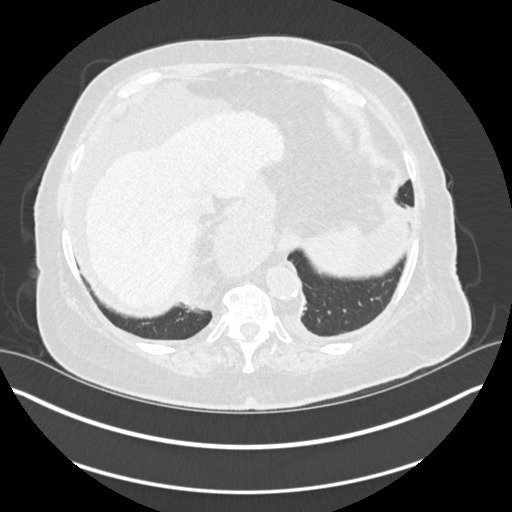
[im 57/149  mediastinal]
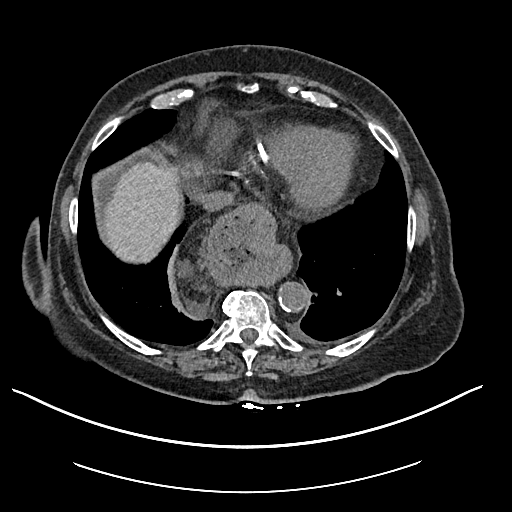
[im 57/149  lung]
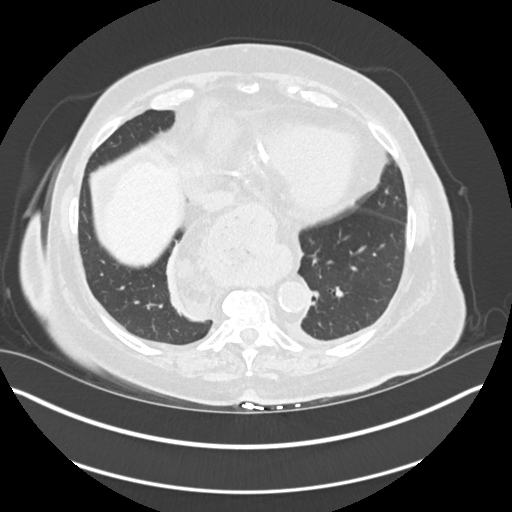
[im 69/149  lung]
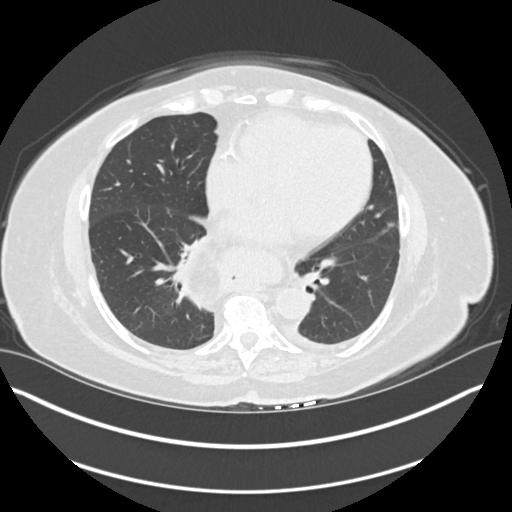
[im 80/149  lung]
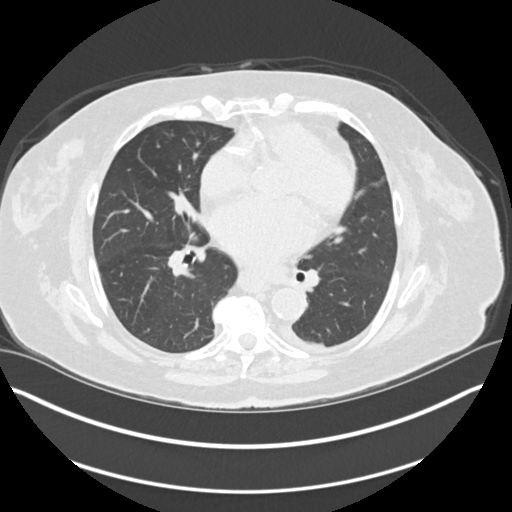
[im 92/149  lung]
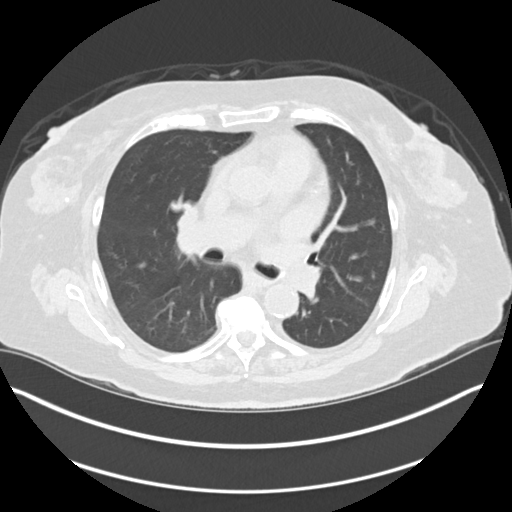
[im 103/149  mediastinal]
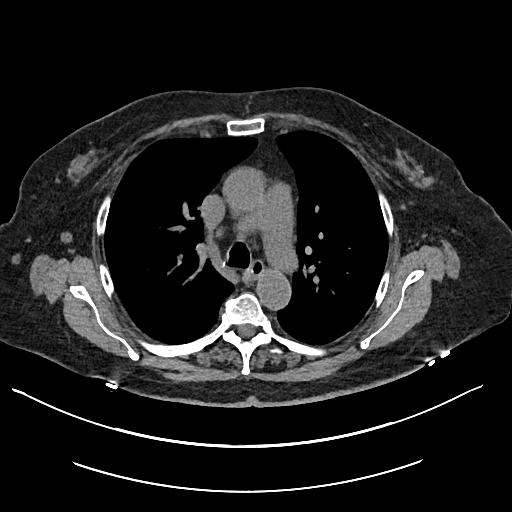
[im 103/149  lung]
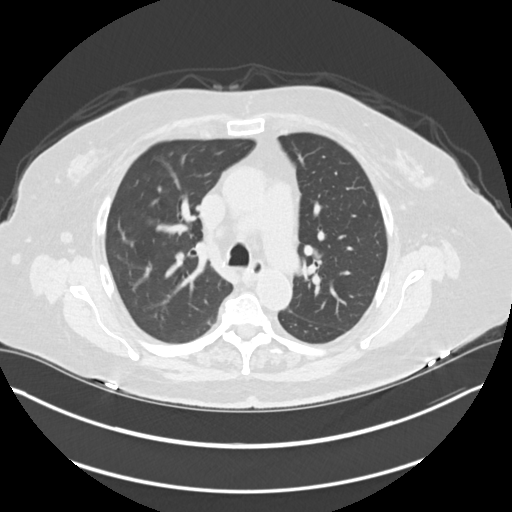
[im 114/149  lung]
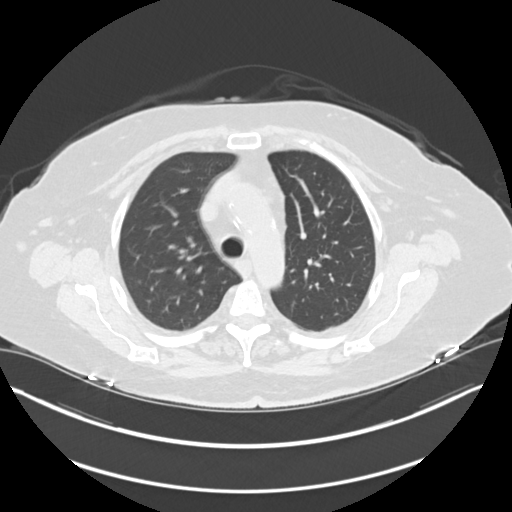
[im 126/149  lung]
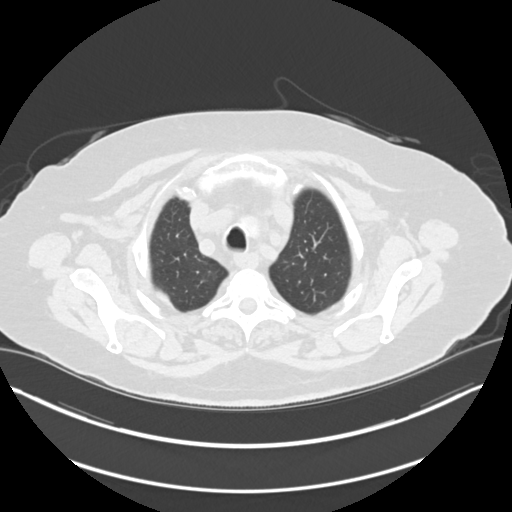
[im 137/149  lung]
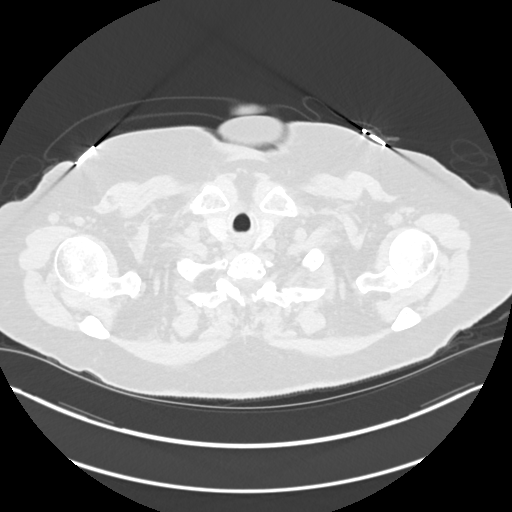

[Series 5: coronals chest 2.00 cor · coronal · 0.58mm/px · 3 of 165 slices shown]
[im 33/165  lung]
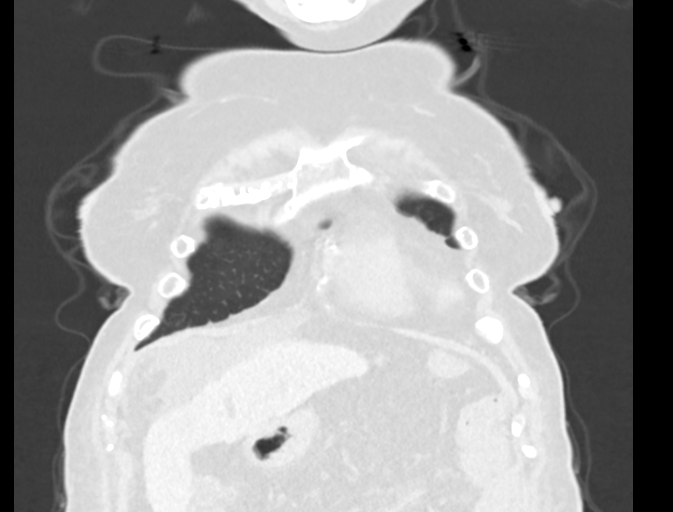
[im 66/165  lung]
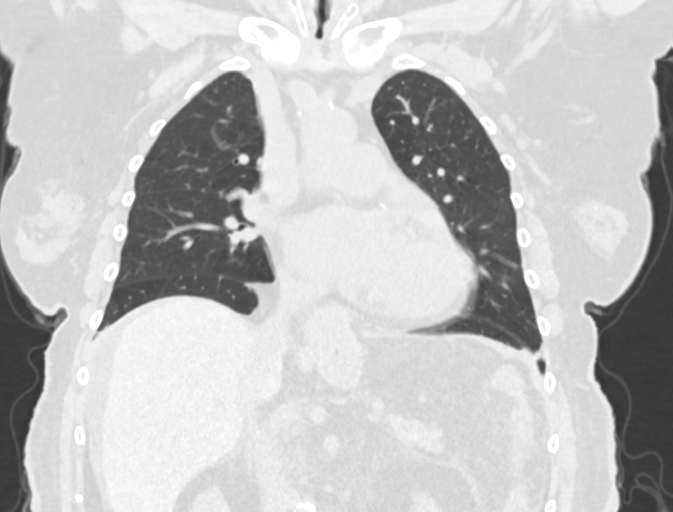
[im 99/165  lung]
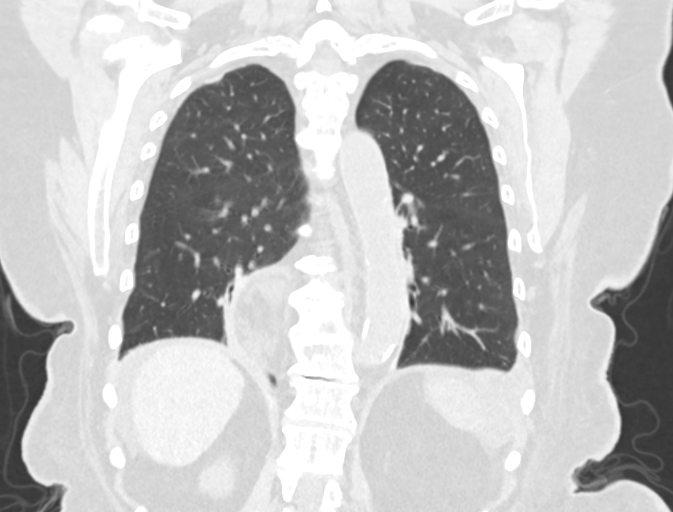

[15 of 36 positions shown; findings below may reference images not displayed]

FINDINGS: Cardiovascular: Coronary artery calcifications are seen.

Mediastinum/Nodes: There are subcentimeter nodes in the mediastinum
with no significant interval change.

Lungs/Pleura: Motion artifacts limit evaluation, more so in the
right lung. As far as seen, there is no focal pulmonary
consolidation. There is minimal left pleural effusion. There is no
pneumothorax.

Upper Abdomen: There is large fixed hiatal hernia. There is
nodularity in the liver surface suggesting cirrhosis. Small ascites
is present.

Musculoskeletal: Unremarkable.
IMPRESSION: There is no focal pulmonary consolidation. Minimal left pleural
effusion is seen. Coronary artery calcifications are seen.

Cirrhosis of liver.  Small ascites.  Large fixed hiatal hernia.

## 2022-06-21 NOTE — Telephone Encounter (Signed)
error 

## 2022-07-20 DIAGNOSIS — E872 Acidosis, unspecified: Secondary | ICD-10-CM

## 2022-07-20 DIAGNOSIS — I959 Hypotension, unspecified: Secondary | ICD-10-CM
